# Patient Record
Sex: Female | Born: 1935
Health system: Southern US, Community
[De-identification: ages and names within clinical notes are randomized; demographics above are authoritative.]

## PROBLEM LIST (undated history)

## (undated) DIAGNOSIS — J180 Bronchopneumonia, unspecified organism: Secondary | ICD-10-CM

## (undated) DIAGNOSIS — M8580 Other specified disorders of bone density and structure, unspecified site: Secondary | ICD-10-CM

## (undated) DIAGNOSIS — L298 Other pruritus: Secondary | ICD-10-CM

## (undated) DIAGNOSIS — I639 Cerebral infarction, unspecified: Secondary | ICD-10-CM

## (undated) DIAGNOSIS — G4730 Sleep apnea, unspecified: Secondary | ICD-10-CM

## (undated) DIAGNOSIS — I10 Essential (primary) hypertension: Secondary | ICD-10-CM

## (undated) DIAGNOSIS — M1990 Unspecified osteoarthritis, unspecified site: Secondary | ICD-10-CM

## (undated) DIAGNOSIS — T783XXA Angioneurotic edema, initial encounter: Secondary | ICD-10-CM

## (undated) DIAGNOSIS — E538 Deficiency of other specified B group vitamins: Secondary | ICD-10-CM

## (undated) DIAGNOSIS — N289 Disorder of kidney and ureter, unspecified: Secondary | ICD-10-CM

## (undated) DIAGNOSIS — IMO0002 Reserved for concepts with insufficient information to code with codable children: Secondary | ICD-10-CM

## (undated) DIAGNOSIS — E785 Hyperlipidemia, unspecified: Secondary | ICD-10-CM

## (undated) DIAGNOSIS — I517 Cardiomegaly: Secondary | ICD-10-CM

## (undated) DIAGNOSIS — L2989 Other pruritus: Secondary | ICD-10-CM

## (undated) HISTORY — DX: Essential (primary) hypertension: I10

## (undated) HISTORY — DX: Other pruritus: L29.8

## (undated) HISTORY — PX: EYE SURGERY: SHX253

## (undated) HISTORY — PX: ABDOMINAL HYSTERECTOMY: SHX81

## (undated) HISTORY — DX: Disorder of kidney and ureter, unspecified: N28.9

## (undated) HISTORY — DX: Cerebral infarction, unspecified: I63.9

## (undated) HISTORY — DX: Hyperlipidemia, unspecified: E78.5

## (undated) HISTORY — DX: Other specified disorders of bone density and structure, unspecified site: M85.80

## (undated) HISTORY — DX: Cardiomegaly: I51.7

## (undated) HISTORY — DX: Deficiency of other specified B group vitamins: E53.8

## (undated) HISTORY — PX: COLON SURGERY: SHX602

## (undated) HISTORY — PX: APPENDECTOMY: SHX54

## (undated) HISTORY — DX: Unspecified osteoarthritis, unspecified site: M19.90

## (undated) HISTORY — DX: Other pruritus: L29.89

## (undated) HISTORY — PX: BACK SURGERY: SHX140

## (undated) HISTORY — DX: Reserved for concepts with insufficient information to code with codable children: IMO0002

## (undated) HISTORY — DX: Sleep apnea, unspecified: G47.30

## (undated) HISTORY — DX: Angioneurotic edema, initial encounter: T78.3XXA

## (undated) HISTORY — PX: KNEE SURGERY: SHX244

---

## 1998-06-28 ENCOUNTER — Encounter: Payer: Self-pay | Admitting: Gastroenterology

## 1998-06-28 ENCOUNTER — Inpatient Hospital Stay (HOSPITAL_COMMUNITY): Admission: RE | Admit: 1998-06-28 | Discharge: 1998-07-04 | Payer: Self-pay | Admitting: Gastroenterology

## 1998-11-21 ENCOUNTER — Encounter: Admission: RE | Admit: 1998-11-21 | Discharge: 1998-12-15 | Payer: Self-pay | Admitting: Orthopaedic Surgery

## 2002-03-16 ENCOUNTER — Other Ambulatory Visit: Admission: RE | Admit: 2002-03-16 | Discharge: 2002-03-16 | Payer: Self-pay | Admitting: Family Medicine

## 2003-09-06 ENCOUNTER — Ambulatory Visit (HOSPITAL_COMMUNITY): Admission: RE | Admit: 2003-09-06 | Discharge: 2003-09-06 | Payer: Self-pay | Admitting: Gastroenterology

## 2004-09-12 ENCOUNTER — Ambulatory Visit: Payer: Self-pay | Admitting: Family Medicine

## 2004-09-19 ENCOUNTER — Ambulatory Visit: Payer: Self-pay | Admitting: Family Medicine

## 2004-10-03 ENCOUNTER — Ambulatory Visit: Payer: Self-pay | Admitting: Family Medicine

## 2004-10-11 ENCOUNTER — Ambulatory Visit: Payer: Self-pay | Admitting: Family Medicine

## 2004-10-18 ENCOUNTER — Ambulatory Visit: Payer: Self-pay | Admitting: Family Medicine

## 2004-11-15 ENCOUNTER — Ambulatory Visit: Payer: Self-pay | Admitting: Family Medicine

## 2004-11-18 ENCOUNTER — Ambulatory Visit: Payer: Self-pay | Admitting: Family Medicine

## 2004-12-11 ENCOUNTER — Ambulatory Visit: Payer: Self-pay | Admitting: Family Medicine

## 2004-12-18 ENCOUNTER — Ambulatory Visit: Payer: Self-pay | Admitting: Family Medicine

## 2005-03-21 ENCOUNTER — Ambulatory Visit: Payer: Self-pay | Admitting: Family Medicine

## 2005-06-06 ENCOUNTER — Ambulatory Visit: Payer: Self-pay | Admitting: Family Medicine

## 2005-06-26 ENCOUNTER — Ambulatory Visit: Payer: Self-pay | Admitting: Family Medicine

## 2005-07-23 ENCOUNTER — Ambulatory Visit: Payer: Self-pay | Admitting: Family Medicine

## 2005-07-26 ENCOUNTER — Ambulatory Visit: Payer: Self-pay

## 2005-09-26 ENCOUNTER — Encounter: Payer: Self-pay | Admitting: Family Medicine

## 2005-09-26 ENCOUNTER — Ambulatory Visit: Payer: Self-pay | Admitting: Family Medicine

## 2005-09-26 ENCOUNTER — Other Ambulatory Visit: Admission: RE | Admit: 2005-09-26 | Discharge: 2005-09-26 | Payer: Self-pay | Admitting: Family Medicine

## 2005-10-02 ENCOUNTER — Encounter: Payer: Self-pay | Admitting: Family Medicine

## 2005-10-02 LAB — CONVERTED CEMR LAB: Pap Smear: NORMAL

## 2005-10-17 LAB — FECAL OCCULT BLOOD, GUAIAC: Fecal Occult Blood: NEGATIVE

## 2005-10-18 ENCOUNTER — Ambulatory Visit: Payer: Self-pay | Admitting: Family Medicine

## 2005-12-28 ENCOUNTER — Ambulatory Visit: Payer: Self-pay | Admitting: Family Medicine

## 2006-04-02 ENCOUNTER — Ambulatory Visit: Payer: Self-pay | Admitting: Family Medicine

## 2006-07-24 ENCOUNTER — Ambulatory Visit: Payer: Self-pay | Admitting: Family Medicine

## 2006-08-23 ENCOUNTER — Ambulatory Visit: Payer: Self-pay | Admitting: Family Medicine

## 2006-10-01 ENCOUNTER — Ambulatory Visit: Payer: Self-pay | Admitting: Family Medicine

## 2006-10-01 LAB — CONVERTED CEMR LAB
ALT: 18 units/L (ref 0–40)
AST: 17 units/L (ref 0–37)
CO2: 31 meq/L (ref 19–32)
Chloride: 100 meq/L (ref 96–112)
Cholesterol: 174 mg/dL (ref 0–200)
Creatinine, Ser: 1.5 mg/dL — ABNORMAL HIGH (ref 0.4–1.2)
HDL: 34 mg/dL — ABNORMAL LOW (ref >39.0)
Hgb A1c MFr Bld: 8.3 %
Hgb A1c MFr Bld: 8.3 % — ABNORMAL HIGH (ref 4.6–6.0)
LDL Cholesterol: 109 mg/dL — ABNORMAL HIGH (ref 0–99)
Potassium: 4.2 meq/L (ref 3.5–5.1)
Sodium: 139 meq/L (ref 135–145)
Total CHOL/HDL Ratio: 5.1
Triglycerides: 157 mg/dL — ABNORMAL HIGH (ref 0–149)
VLDL: 31 mg/dL (ref 0–40)

## 2006-10-04 ENCOUNTER — Ambulatory Visit: Payer: Self-pay | Admitting: Family Medicine

## 2006-10-18 ENCOUNTER — Ambulatory Visit: Payer: Self-pay | Admitting: Family Medicine

## 2006-10-18 LAB — CONVERTED CEMR LAB
Albumin: 3.4 g/dL — ABNORMAL LOW (ref 3.5–5.2)
BUN: 22 mg/dL (ref 6–23)
CO2: 26 meq/L (ref 19–32)
Calcium: 8.8 mg/dL (ref 8.4–10.5)
Chloride: 104 meq/L (ref 96–112)
Creatinine, Ser: 1.2 mg/dL (ref 0.4–1.2)
Creatinine,U: 211.6 mg/dL
GFR calc Af Amer: 56 mL/min
GFR calc non Af Amer: 47 mL/min
Glucose, Bld: 157 mg/dL — ABNORMAL HIGH (ref 70–99)
Microalb Creat Ratio: 0.9 mg/g (ref 0.0–30.0)
Microalb, Ur: 0.2 mg/dL (ref 0.0–1.9)
Phosphorus: 3 mg/dL (ref 2.3–4.6)
Potassium: 3.9 meq/L (ref 3.5–5.1)
Sodium: 136 meq/L (ref 135–145)

## 2006-11-04 ENCOUNTER — Ambulatory Visit: Payer: Self-pay | Admitting: Family Medicine

## 2006-11-19 DIAGNOSIS — E538 Deficiency of other specified B group vitamins: Secondary | ICD-10-CM

## 2006-11-19 HISTORY — DX: Deficiency of other specified B group vitamins: E53.8

## 2006-11-27 ENCOUNTER — Encounter: Payer: Self-pay | Admitting: Family Medicine

## 2006-11-27 DIAGNOSIS — N6019 Diffuse cystic mastopathy of unspecified breast: Secondary | ICD-10-CM | POA: Insufficient documentation

## 2006-11-27 DIAGNOSIS — R32 Unspecified urinary incontinence: Secondary | ICD-10-CM | POA: Insufficient documentation

## 2006-11-27 DIAGNOSIS — L719 Rosacea, unspecified: Secondary | ICD-10-CM | POA: Insufficient documentation

## 2006-11-27 DIAGNOSIS — N3946 Mixed incontinence: Secondary | ICD-10-CM | POA: Insufficient documentation

## 2006-11-27 DIAGNOSIS — E785 Hyperlipidemia, unspecified: Secondary | ICD-10-CM

## 2006-11-27 DIAGNOSIS — I10 Essential (primary) hypertension: Secondary | ICD-10-CM | POA: Insufficient documentation

## 2006-11-27 DIAGNOSIS — E11319 Type 2 diabetes mellitus with unspecified diabetic retinopathy without macular edema: Secondary | ICD-10-CM | POA: Insufficient documentation

## 2006-11-27 DIAGNOSIS — M1990 Unspecified osteoarthritis, unspecified site: Secondary | ICD-10-CM | POA: Insufficient documentation

## 2006-11-27 DIAGNOSIS — E1122 Type 2 diabetes mellitus with diabetic chronic kidney disease: Secondary | ICD-10-CM | POA: Insufficient documentation

## 2006-11-27 DIAGNOSIS — T783XXA Angioneurotic edema, initial encounter: Secondary | ICD-10-CM | POA: Insufficient documentation

## 2006-11-29 ENCOUNTER — Ambulatory Visit: Payer: Self-pay | Admitting: Family Medicine

## 2006-11-29 LAB — CONVERTED CEMR LAB
ALT: 17 units/L (ref 0–40)
AST: 19 units/L (ref 0–37)
Albumin: 3.5 g/dL (ref 3.5–5.2)
BUN: 19 mg/dL (ref 6–23)
Basophils Absolute: 0 10*3/uL (ref 0.0–0.1)
Basophils Relative: 0.1 % (ref 0.0–1.0)
CO2: 31 meq/L (ref 19–32)
Calcium: 8.9 mg/dL (ref 8.4–10.5)
Chloride: 108 meq/L (ref 96–112)
Cholesterol: 164 mg/dL (ref 0–200)
Creatinine, Ser: 1.2 mg/dL (ref 0.4–1.2)
Eosinophils Absolute: 0.3 10*3/uL (ref 0.0–0.6)
Eosinophils Relative: 5.6 % — ABNORMAL HIGH (ref 0.0–5.0)
Folate: 8.3 ng/mL
GFR calc Af Amer: 56 mL/min
GFR calc non Af Amer: 47 mL/min
Glucose, Bld: 147 mg/dL — ABNORMAL HIGH (ref 70–99)
HCT: 37.7 % (ref 36.0–46.0)
HDL: 32.9 mg/dL — ABNORMAL LOW (ref >39.0)
Hemoglobin: 13 g/dL (ref 12.0–15.0)
Hgb A1c MFr Bld: 7.1 % — ABNORMAL HIGH (ref 4.6–6.0)
LDL Cholesterol: 100 mg/dL — ABNORMAL HIGH (ref 0–99)
Lymphocytes Relative: 19.7 % (ref 12.0–46.0)
MCHC: 34.4 g/dL (ref 30.0–36.0)
MCV: 80.8 fL (ref 78.0–100.0)
Monocytes Absolute: 0.4 10*3/uL (ref 0.2–0.7)
Monocytes Relative: 8.6 % (ref 3.0–11.0)
Neutro Abs: 2.9 10*3/uL (ref 1.4–7.7)
Neutrophils Relative %: 66 % (ref 43.0–77.0)
Phosphorus: 3.2 mg/dL (ref 2.3–4.6)
Platelets: 205 10*3/uL (ref 150–400)
Potassium: 4.5 meq/L (ref 3.5–5.1)
RBC: 4.66 M/uL (ref 3.87–5.11)
RDW: 13.2 % (ref 11.5–14.6)
Sodium: 144 meq/L (ref 135–145)
TSH: 2.68 microintl units/mL (ref 0.35–5.50)
Total CHOL/HDL Ratio: 5
Triglycerides: 158 mg/dL — ABNORMAL HIGH (ref 0–149)
VLDL: 32 mg/dL (ref 0–40)
Vitamin B-12: 116 pg/mL — ABNORMAL LOW (ref 211–911)
WBC: 4.5 10*3/uL (ref 4.5–10.5)

## 2006-12-04 ENCOUNTER — Ambulatory Visit: Payer: Self-pay | Admitting: Family Medicine

## 2007-01-10 ENCOUNTER — Ambulatory Visit: Payer: Self-pay | Admitting: Family Medicine

## 2007-01-10 DIAGNOSIS — E538 Deficiency of other specified B group vitamins: Secondary | ICD-10-CM | POA: Insufficient documentation

## 2007-03-05 ENCOUNTER — Ambulatory Visit: Payer: Self-pay | Admitting: Family Medicine

## 2007-03-07 LAB — CONVERTED CEMR LAB
ALT: 16 units/L (ref 0–35)
AST: 16 units/L (ref 0–37)
Cholesterol: 166 mg/dL (ref 0–200)
Creatinine, Ser: 1.2 mg/dL (ref 0.4–1.2)
HDL: 25.6 mg/dL — ABNORMAL LOW (ref >39.0)
Hgb A1c MFr Bld: 6.9 % — ABNORMAL HIGH (ref 4.6–6.0)
LDL Cholesterol: 107 mg/dL — ABNORMAL HIGH (ref 0–99)
Total CHOL/HDL Ratio: 6.5
Triglycerides: 167 mg/dL — ABNORMAL HIGH (ref 0–149)
VLDL: 33 mg/dL (ref 0–40)

## 2007-04-07 ENCOUNTER — Ambulatory Visit: Payer: Self-pay | Admitting: Family Medicine

## 2007-05-14 ENCOUNTER — Ambulatory Visit: Payer: Self-pay | Admitting: Family Medicine

## 2007-06-16 ENCOUNTER — Ambulatory Visit: Payer: Self-pay | Admitting: Family Medicine

## 2007-06-30 ENCOUNTER — Encounter: Payer: Self-pay | Admitting: Family Medicine

## 2007-07-14 ENCOUNTER — Ambulatory Visit: Payer: Self-pay | Admitting: Family Medicine

## 2007-07-14 LAB — CONVERTED CEMR LAB
Bilirubin Urine: NEGATIVE
Blood in Urine, dipstick: NEGATIVE
Casts: 0 /lpf
Glucose, Urine, Semiquant: NEGATIVE
Ketones, urine, test strip: NEGATIVE
Nitrite: NEGATIVE
Protein, U semiquant: NEGATIVE
RBC / HPF: 0
Specific Gravity, Urine: 1.01
Urine crystals, microscopic: 0 /hpf
Urobilinogen, UA: 0.2
Yeast, UA: 0
pH: 6.5

## 2007-07-15 ENCOUNTER — Encounter: Payer: Self-pay | Admitting: Family Medicine

## 2007-07-18 ENCOUNTER — Encounter: Payer: Self-pay | Admitting: Family Medicine

## 2007-09-08 ENCOUNTER — Ambulatory Visit: Payer: Self-pay | Admitting: Family Medicine

## 2007-09-12 LAB — CONVERTED CEMR LAB
ALT: 16 units/L (ref 0–35)
AST: 16 units/L (ref 0–37)
Albumin: 3.4 g/dL — ABNORMAL LOW (ref 3.5–5.2)
BUN: 17 mg/dL (ref 6–23)
CO2: 27 meq/L (ref 19–32)
Calcium: 9 mg/dL (ref 8.4–10.5)
Chloride: 103 meq/L (ref 96–112)
Cholesterol: 144 mg/dL (ref 0–200)
Creatinine, Ser: 1.3 mg/dL — ABNORMAL HIGH (ref 0.4–1.2)
GFR calc Af Amer: 51 mL/min
GFR calc non Af Amer: 42 mL/min
Glucose, Bld: 152 mg/dL — ABNORMAL HIGH (ref 70–99)
HDL: 30.6 mg/dL — ABNORMAL LOW (ref >39.0)
Hgb A1c MFr Bld: 6.1 % — ABNORMAL HIGH (ref 4.6–6.0)
LDL Cholesterol: 99 mg/dL (ref 0–99)
Phosphorus: 3.1 mg/dL (ref 2.3–4.6)
Potassium: 3.8 meq/L (ref 3.5–5.1)
Sodium: 137 meq/L (ref 135–145)
Total CHOL/HDL Ratio: 4.7
Triglycerides: 72 mg/dL (ref 0–149)
VLDL: 14 mg/dL (ref 0–40)

## 2007-10-03 ENCOUNTER — Ambulatory Visit: Payer: Self-pay | Admitting: Family Medicine

## 2007-10-03 LAB — CONVERTED CEMR LAB: Whiff Test: NEGATIVE

## 2007-10-24 ENCOUNTER — Encounter (INDEPENDENT_AMBULATORY_CARE_PROVIDER_SITE_OTHER): Payer: Self-pay | Admitting: *Deleted

## 2007-11-20 ENCOUNTER — Encounter: Payer: Self-pay | Admitting: Family Medicine

## 2007-11-24 ENCOUNTER — Encounter: Payer: Self-pay | Admitting: Family Medicine

## 2007-12-03 ENCOUNTER — Encounter: Payer: Self-pay | Admitting: Family Medicine

## 2007-12-05 ENCOUNTER — Encounter (INDEPENDENT_AMBULATORY_CARE_PROVIDER_SITE_OTHER): Payer: Self-pay | Admitting: *Deleted

## 2008-01-26 ENCOUNTER — Ambulatory Visit: Payer: Self-pay | Admitting: Family Medicine

## 2008-01-26 DIAGNOSIS — R609 Edema, unspecified: Secondary | ICD-10-CM | POA: Insufficient documentation

## 2008-01-26 DIAGNOSIS — G8929 Other chronic pain: Secondary | ICD-10-CM | POA: Insufficient documentation

## 2008-01-26 DIAGNOSIS — M549 Dorsalgia, unspecified: Secondary | ICD-10-CM

## 2008-01-27 LAB — CONVERTED CEMR LAB
ALT: 14 units/L (ref 0–35)
AST: 14 units/L (ref 0–37)
Albumin: 3.5 g/dL (ref 3.5–5.2)
BUN: 28 mg/dL — ABNORMAL HIGH (ref 6–23)
CO2: 30 meq/L (ref 19–32)
Calcium: 8.8 mg/dL (ref 8.4–10.5)
Chloride: 102 meq/L (ref 96–112)
Cholesterol: 166 mg/dL (ref 0–200)
Creatinine, Ser: 1.3 mg/dL — ABNORMAL HIGH (ref 0.4–1.2)
GFR calc Af Amer: 51 mL/min
GFR calc non Af Amer: 42 mL/min
Glucose, Bld: 144 mg/dL — ABNORMAL HIGH (ref 70–99)
HDL: 28.2 mg/dL — ABNORMAL LOW (ref >39.0)
Hgb A1c MFr Bld: 6.5 % — ABNORMAL HIGH (ref 4.6–6.0)
LDL Cholesterol: 117 mg/dL — ABNORMAL HIGH (ref 0–99)
Phosphorus: 3.3 mg/dL (ref 2.3–4.6)
Potassium: 4.4 meq/L (ref 3.5–5.1)
Sodium: 140 meq/L (ref 135–145)
Total CHOL/HDL Ratio: 5.9
Triglycerides: 103 mg/dL (ref 0–149)
VLDL: 21 mg/dL (ref 0–40)

## 2008-02-23 ENCOUNTER — Encounter: Payer: Self-pay | Admitting: Family Medicine

## 2008-03-02 ENCOUNTER — Ambulatory Visit: Payer: Self-pay | Admitting: Family Medicine

## 2008-03-20 HISTORY — PX: SPINE SURGERY: SHX786

## 2008-04-02 ENCOUNTER — Inpatient Hospital Stay (HOSPITAL_COMMUNITY): Admission: AD | Admit: 2008-04-02 | Discharge: 2008-04-03 | Payer: Self-pay | Admitting: Specialist

## 2008-04-06 ENCOUNTER — Encounter: Payer: Self-pay | Admitting: Family Medicine

## 2008-04-28 ENCOUNTER — Ambulatory Visit: Payer: Self-pay | Admitting: Family Medicine

## 2008-04-29 ENCOUNTER — Encounter: Payer: Self-pay | Admitting: Family Medicine

## 2008-05-11 ENCOUNTER — Telehealth (INDEPENDENT_AMBULATORY_CARE_PROVIDER_SITE_OTHER): Payer: Self-pay | Admitting: *Deleted

## 2008-05-11 ENCOUNTER — Ambulatory Visit: Payer: Self-pay | Admitting: Family Medicine

## 2008-05-12 ENCOUNTER — Encounter: Payer: Self-pay | Admitting: Family Medicine

## 2008-05-28 ENCOUNTER — Ambulatory Visit: Payer: Self-pay | Admitting: Family Medicine

## 2008-06-23 ENCOUNTER — Telehealth: Payer: Self-pay | Admitting: Family Medicine

## 2008-06-29 ENCOUNTER — Ambulatory Visit: Payer: Self-pay | Admitting: Family Medicine

## 2008-06-29 DIAGNOSIS — N289 Disorder of kidney and ureter, unspecified: Secondary | ICD-10-CM | POA: Insufficient documentation

## 2008-06-30 LAB — CONVERTED CEMR LAB
ALT: 36 units/L — ABNORMAL HIGH (ref 0–35)
AST: 30 units/L (ref 0–37)
Albumin: 3.5 g/dL (ref 3.5–5.2)
Alkaline Phosphatase: 73 units/L (ref 39–117)
BUN: 18 mg/dL (ref 6–23)
Bilirubin, Direct: 0.1 mg/dL (ref 0.0–0.3)
CO2: 30 meq/L (ref 19–32)
Calcium: 9.3 mg/dL (ref 8.4–10.5)
Chloride: 106 meq/L (ref 96–112)
Creatinine, Ser: 1.2 mg/dL (ref 0.4–1.2)
GFR calc Af Amer: 56 mL/min
GFR calc non Af Amer: 46 mL/min
Glucose, Bld: 142 mg/dL — ABNORMAL HIGH (ref 70–99)
Hgb A1c MFr Bld: 7.1 % — ABNORMAL HIGH (ref 4.6–6.0)
Phosphorus: 3.5 mg/dL (ref 2.3–4.6)
Potassium: 4.8 meq/L (ref 3.5–5.1)
Sodium: 140 meq/L (ref 135–145)
Total Bilirubin: 0.8 mg/dL (ref 0.3–1.2)
Total Protein: 6.1 g/dL (ref 6.0–8.3)

## 2008-08-03 ENCOUNTER — Telehealth (INDEPENDENT_AMBULATORY_CARE_PROVIDER_SITE_OTHER): Payer: Self-pay | Admitting: *Deleted

## 2008-08-03 ENCOUNTER — Ambulatory Visit: Payer: Self-pay | Admitting: Family Medicine

## 2008-08-03 DIAGNOSIS — M6281 Muscle weakness (generalized): Secondary | ICD-10-CM | POA: Insufficient documentation

## 2008-08-06 ENCOUNTER — Encounter: Payer: Self-pay | Admitting: Family Medicine

## 2008-08-06 LAB — HM COLONOSCOPY

## 2008-08-17 ENCOUNTER — Ambulatory Visit: Payer: Self-pay | Admitting: Family Medicine

## 2008-08-18 LAB — CONVERTED CEMR LAB
ALT: 20 units/L (ref 0–35)
AST: 19 units/L (ref 0–37)
Albumin: 3.2 g/dL — ABNORMAL LOW (ref 3.5–5.2)
Alkaline Phosphatase: 58 units/L (ref 39–117)
BUN: 26 mg/dL — ABNORMAL HIGH (ref 6–23)
Bilirubin, Direct: 0.1 mg/dL (ref 0.0–0.3)
CO2: 29 meq/L (ref 19–32)
Calcium: 8.8 mg/dL (ref 8.4–10.5)
Chloride: 105 meq/L (ref 96–112)
Creatinine, Ser: 1.2 mg/dL (ref 0.4–1.2)
GFR calc Af Amer: 56 mL/min
GFR calc non Af Amer: 46 mL/min
Glucose, Bld: 161 mg/dL — ABNORMAL HIGH (ref 70–99)
Phosphorus: 3.3 mg/dL (ref 2.3–4.6)
Potassium: 4.3 meq/L (ref 3.5–5.1)
Sodium: 139 meq/L (ref 135–145)
Total Bilirubin: 0.7 mg/dL (ref 0.3–1.2)
Total Protein: 5.6 g/dL — ABNORMAL LOW (ref 6.0–8.3)

## 2008-08-31 ENCOUNTER — Telehealth: Payer: Self-pay | Admitting: Family Medicine

## 2008-09-08 ENCOUNTER — Ambulatory Visit: Payer: Self-pay | Admitting: Family Medicine

## 2008-10-29 ENCOUNTER — Encounter: Payer: Self-pay | Admitting: Family Medicine

## 2008-11-05 ENCOUNTER — Ambulatory Visit: Payer: Self-pay | Admitting: Family Medicine

## 2008-11-08 LAB — CONVERTED CEMR LAB
Creatinine, Ser: 1.1 mg/dL (ref 0.4–1.2)
Creatinine,U: 139.4 mg/dL
Glucose, Bld: 140 mg/dL — ABNORMAL HIGH (ref 70–99)
Hgb A1c MFr Bld: 7 % — ABNORMAL HIGH (ref 4.6–6.5)
Microalb Creat Ratio: 1.4 mg/g (ref 0.0–30.0)
Microalb, Ur: 0.2 mg/dL (ref 0.0–1.9)
Phosphorus: 3.5 mg/dL (ref 2.3–4.6)
Potassium: 4.3 meq/L (ref 3.5–5.1)
Sodium: 142 meq/L (ref 135–145)

## 2008-11-09 ENCOUNTER — Encounter (INDEPENDENT_AMBULATORY_CARE_PROVIDER_SITE_OTHER): Payer: Self-pay | Admitting: *Deleted

## 2008-11-12 ENCOUNTER — Ambulatory Visit: Payer: Self-pay | Admitting: Family Medicine

## 2008-11-18 LAB — HM DEXA SCAN

## 2008-11-23 ENCOUNTER — Encounter: Payer: Self-pay | Admitting: Family Medicine

## 2008-11-24 ENCOUNTER — Encounter: Payer: Self-pay | Admitting: Family Medicine

## 2008-11-25 ENCOUNTER — Ambulatory Visit: Payer: Self-pay | Admitting: Internal Medicine

## 2008-11-25 ENCOUNTER — Encounter: Payer: Self-pay | Admitting: Family Medicine

## 2008-11-26 ENCOUNTER — Encounter: Payer: Self-pay | Admitting: Family Medicine

## 2008-11-29 ENCOUNTER — Encounter (INDEPENDENT_AMBULATORY_CARE_PROVIDER_SITE_OTHER): Payer: Self-pay | Admitting: *Deleted

## 2008-12-13 ENCOUNTER — Encounter: Payer: Self-pay | Admitting: Family Medicine

## 2008-12-20 DIAGNOSIS — M8580 Other specified disorders of bone density and structure, unspecified site: Secondary | ICD-10-CM | POA: Insufficient documentation

## 2009-01-04 ENCOUNTER — Ambulatory Visit: Payer: Self-pay | Admitting: Family Medicine

## 2009-01-04 LAB — CONVERTED CEMR LAB
Bacteria, UA: 0
Bilirubin Urine: NEGATIVE
Blood in Urine, dipstick: NEGATIVE
Casts: 0 /lpf
Glucose, Urine, Semiquant: NEGATIVE
Ketones, urine, test strip: NEGATIVE
Nitrite: NEGATIVE
Specific Gravity, Urine: 1.02
Urobilinogen, UA: 0.2
WBC Urine, dipstick: NEGATIVE
pH: 6

## 2009-01-05 ENCOUNTER — Encounter (INDEPENDENT_AMBULATORY_CARE_PROVIDER_SITE_OTHER): Payer: Self-pay | Admitting: Internal Medicine

## 2009-01-07 ENCOUNTER — Ambulatory Visit: Payer: Self-pay | Admitting: Family Medicine

## 2009-02-14 ENCOUNTER — Ambulatory Visit: Payer: Self-pay | Admitting: Family Medicine

## 2009-02-15 LAB — CONVERTED CEMR LAB
ALT: 19 units/L (ref 0–35)
AST: 19 units/L (ref 0–37)
Albumin: 3.3 g/dL — ABNORMAL LOW (ref 3.5–5.2)
BUN: 23 mg/dL (ref 6–23)
CO2: 31 meq/L (ref 19–32)
Calcium: 9 mg/dL (ref 8.4–10.5)
Chloride: 108 meq/L (ref 96–112)
Cholesterol: 175 mg/dL (ref 0–200)
Creatinine, Ser: 1.1 mg/dL (ref 0.4–1.2)
Glucose, Bld: 130 mg/dL — ABNORMAL HIGH (ref 70–99)
HDL: 29 mg/dL — ABNORMAL LOW (ref >39.00)
Hgb A1c MFr Bld: 6.4 % (ref 4.6–6.5)
LDL Cholesterol: 115 mg/dL — ABNORMAL HIGH (ref 0–99)
Phosphorus: 3.6 mg/dL (ref 2.3–4.6)
Potassium: 4.7 meq/L (ref 3.5–5.1)
Sodium: 143 meq/L (ref 135–145)
Total CHOL/HDL Ratio: 6
Triglycerides: 156 mg/dL — ABNORMAL HIGH (ref 0.0–149.0)
VLDL: 31.2 mg/dL (ref 0.0–40.0)

## 2009-03-02 ENCOUNTER — Ambulatory Visit: Payer: Self-pay | Admitting: Family Medicine

## 2009-03-07 ENCOUNTER — Telehealth: Payer: Self-pay | Admitting: Family Medicine

## 2009-03-11 ENCOUNTER — Ambulatory Visit: Payer: Self-pay | Admitting: Family Medicine

## 2009-03-25 ENCOUNTER — Ambulatory Visit: Payer: Self-pay | Admitting: Family Medicine

## 2009-03-28 LAB — CONVERTED CEMR LAB
Albumin: 3.6 g/dL (ref 3.5–5.2)
BUN: 29 mg/dL — ABNORMAL HIGH (ref 6–23)
CO2: 31 meq/L (ref 19–32)
Calcium: 8.9 mg/dL (ref 8.4–10.5)
Chloride: 106 meq/L (ref 96–112)
Creatinine, Ser: 1.3 mg/dL — ABNORMAL HIGH (ref 0.4–1.2)
Glucose, Bld: 189 mg/dL — ABNORMAL HIGH (ref 70–99)
Phosphorus: 3.9 mg/dL (ref 2.3–4.6)
Potassium: 4.4 meq/L (ref 3.5–5.1)
Sodium: 142 meq/L (ref 135–145)

## 2009-03-29 ENCOUNTER — Telehealth (INDEPENDENT_AMBULATORY_CARE_PROVIDER_SITE_OTHER): Payer: Self-pay | Admitting: *Deleted

## 2009-04-06 ENCOUNTER — Ambulatory Visit: Payer: Self-pay | Admitting: Family Medicine

## 2009-04-06 LAB — CONVERTED CEMR LAB
Bilirubin Urine: NEGATIVE
Blood in Urine, dipstick: NEGATIVE
Casts: 0 /lpf
Glucose, Urine, Semiquant: NEGATIVE
Ketones, urine, test strip: NEGATIVE
Nitrite: NEGATIVE
Protein, U semiquant: NEGATIVE
RBC / HPF: 1
Specific Gravity, Urine: 1.025
Urine crystals, microscopic: 0 /hpf
Urobilinogen, UA: 0.2
WBC Urine, dipstick: NEGATIVE
Yeast, UA: 0
pH: 6

## 2009-04-07 ENCOUNTER — Encounter: Payer: Self-pay | Admitting: Family Medicine

## 2009-04-11 ENCOUNTER — Ambulatory Visit: Payer: Self-pay | Admitting: Family Medicine

## 2009-04-12 ENCOUNTER — Encounter: Payer: Self-pay | Admitting: Family Medicine

## 2009-06-17 ENCOUNTER — Ambulatory Visit: Payer: Self-pay | Admitting: Family Medicine

## 2009-06-20 LAB — CONVERTED CEMR LAB
ALT: 21 units/L (ref 0–35)
AST: 18 units/L (ref 0–37)
Albumin: 3.6 g/dL (ref 3.5–5.2)
BUN: 22 mg/dL (ref 6–23)
CO2: 31 meq/L (ref 19–32)
Calcium: 8.8 mg/dL (ref 8.4–10.5)
Chloride: 103 meq/L (ref 96–112)
Cholesterol: 191 mg/dL (ref 0–200)
Creatinine, Ser: 1.5 mg/dL — ABNORMAL HIGH (ref 0.4–1.2)
GFR calc non Af Amer: 35.67 mL/min (ref >60)
Glucose, Bld: 177 mg/dL — ABNORMAL HIGH (ref 70–99)
HDL: 33.5 mg/dL — ABNORMAL LOW (ref >39.00)
Hgb A1c MFr Bld: 7 % — ABNORMAL HIGH (ref 4.6–6.5)
LDL Cholesterol: 123 mg/dL — ABNORMAL HIGH (ref 0–99)
Phosphorus: 3.2 mg/dL (ref 2.3–4.6)
Potassium: 4.5 meq/L (ref 3.5–5.1)
Sodium: 143 meq/L (ref 135–145)
Total CHOL/HDL Ratio: 6
Triglycerides: 172 mg/dL — ABNORMAL HIGH (ref 0.0–149.0)
VLDL: 34.4 mg/dL (ref 0.0–40.0)

## 2009-06-22 ENCOUNTER — Ambulatory Visit: Payer: Self-pay | Admitting: Family Medicine

## 2009-06-22 LAB — CONVERTED CEMR LAB
Bacteria, UA: 0
Bilirubin Urine: NEGATIVE
Blood in Urine, dipstick: NEGATIVE
Casts: 0 /lpf
Glucose, Urine, Semiquant: NEGATIVE
Ketones, urine, test strip: NEGATIVE
Nitrite: NEGATIVE
RBC / HPF: 0
Specific Gravity, Urine: 1.02
Urine crystals, microscopic: 0 /hpf
Urobilinogen, UA: 0.2
WBC Urine, dipstick: NEGATIVE
WBC, UA: 0 cells/hpf
Yeast, UA: 0
pH: 6

## 2009-07-04 ENCOUNTER — Ambulatory Visit: Payer: Self-pay | Admitting: Family Medicine

## 2009-07-08 ENCOUNTER — Ambulatory Visit: Payer: Self-pay | Admitting: Family Medicine

## 2009-07-18 ENCOUNTER — Telehealth: Payer: Self-pay | Admitting: Family Medicine

## 2009-07-28 ENCOUNTER — Encounter: Payer: Self-pay | Admitting: Family Medicine

## 2009-08-08 ENCOUNTER — Ambulatory Visit: Payer: Self-pay | Admitting: Family Medicine

## 2009-08-25 ENCOUNTER — Encounter: Payer: Self-pay | Admitting: Family Medicine

## 2009-09-21 ENCOUNTER — Encounter: Payer: Self-pay | Admitting: Family Medicine

## 2009-10-05 ENCOUNTER — Encounter: Payer: Self-pay | Admitting: Family Medicine

## 2009-10-27 ENCOUNTER — Encounter: Payer: Self-pay | Admitting: Family Medicine

## 2009-10-31 ENCOUNTER — Encounter: Payer: Self-pay | Admitting: Family Medicine

## 2009-11-07 ENCOUNTER — Ambulatory Visit: Payer: Self-pay | Admitting: Family Medicine

## 2009-11-07 LAB — CONVERTED CEMR LAB
ALT: 19 units/L (ref 0–35)
AST: 16 units/L (ref 0–37)
Albumin: 3.4 g/dL — ABNORMAL LOW (ref 3.5–5.2)
BUN: 24 mg/dL — ABNORMAL HIGH (ref 6–23)
CO2: 30 meq/L (ref 19–32)
Calcium: 9 mg/dL (ref 8.4–10.5)
Chloride: 108 meq/L (ref 96–112)
Cholesterol: 178 mg/dL (ref 0–200)
Creatinine, Ser: 1.3 mg/dL — ABNORMAL HIGH (ref 0.4–1.2)
GFR calc non Af Amer: 42.03 mL/min (ref >60)
Glucose, Bld: 196 mg/dL — ABNORMAL HIGH (ref 70–99)
HDL: 36.6 mg/dL — ABNORMAL LOW (ref >39.00)
Hgb A1c MFr Bld: 7.9 % — ABNORMAL HIGH (ref 4.6–6.5)
LDL Cholesterol: 114 mg/dL — ABNORMAL HIGH (ref 0–99)
Phosphorus: 3.6 mg/dL (ref 2.3–4.6)
Potassium: 4.2 meq/L (ref 3.5–5.1)
Sodium: 142 meq/L (ref 135–145)
Total CHOL/HDL Ratio: 5
Triglycerides: 137 mg/dL (ref 0.0–149.0)
VLDL: 27.4 mg/dL (ref 0.0–40.0)

## 2009-11-09 ENCOUNTER — Ambulatory Visit: Payer: Self-pay | Admitting: Family Medicine

## 2009-11-09 DIAGNOSIS — B0229 Other postherpetic nervous system involvement: Secondary | ICD-10-CM | POA: Insufficient documentation

## 2009-12-08 ENCOUNTER — Encounter: Payer: Self-pay | Admitting: Family Medicine

## 2009-12-08 LAB — HM DIABETES EYE EXAM

## 2009-12-13 ENCOUNTER — Encounter: Payer: Self-pay | Admitting: Family Medicine

## 2010-01-10 ENCOUNTER — Encounter: Payer: Self-pay | Admitting: Family Medicine

## 2010-01-25 ENCOUNTER — Encounter: Payer: Self-pay | Admitting: Family Medicine

## 2010-02-14 ENCOUNTER — Ambulatory Visit: Payer: Self-pay | Admitting: Family Medicine

## 2010-02-14 LAB — CONVERTED CEMR LAB
Albumin: 4 g/dL (ref 3.5–5.2)
BUN: 33 mg/dL — ABNORMAL HIGH (ref 6–23)
CO2: 30 meq/L (ref 19–32)
Calcium: 9.2 mg/dL (ref 8.4–10.5)
Chloride: 108 meq/L (ref 96–112)
Creatinine, Ser: 1.5 mg/dL — ABNORMAL HIGH (ref 0.4–1.2)
Creatinine,U: 286 mg/dL
GFR calc non Af Amer: 34.81 mL/min (ref >60)
Glucose, Bld: 151 mg/dL — ABNORMAL HIGH (ref 70–99)
Hgb A1c MFr Bld: 8.5 % — ABNORMAL HIGH (ref 4.6–6.5)
Microalb Creat Ratio: 0.6 mg/g (ref 0.0–30.0)
Microalb, Ur: 1.7 mg/dL (ref 0.0–1.9)
Phosphorus: 4.2 mg/dL (ref 2.3–4.6)
Potassium: 4.3 meq/L (ref 3.5–5.1)
Sodium: 143 meq/L (ref 135–145)

## 2010-02-17 ENCOUNTER — Ambulatory Visit: Payer: Self-pay | Admitting: Family Medicine

## 2010-02-17 ENCOUNTER — Telehealth: Payer: Self-pay | Admitting: Family Medicine

## 2010-03-16 ENCOUNTER — Encounter: Payer: Self-pay | Admitting: Family Medicine

## 2010-03-16 LAB — HM MAMMOGRAPHY: HM Mammogram: NORMAL

## 2010-03-22 ENCOUNTER — Encounter: Payer: Self-pay | Admitting: Family Medicine

## 2010-03-27 ENCOUNTER — Telehealth: Payer: Self-pay | Admitting: Family Medicine

## 2010-03-27 ENCOUNTER — Ambulatory Visit: Payer: Self-pay | Admitting: Family Medicine

## 2010-03-27 LAB — CONVERTED CEMR LAB
Bilirubin Urine: NEGATIVE
Blood in Urine, dipstick: NEGATIVE
Casts: 0 /lpf
Glucose, Urine, Semiquant: NEGATIVE
Ketones, urine, test strip: NEGATIVE
Nitrite: NEGATIVE
Protein, U semiquant: NEGATIVE
RBC / HPF: 0
Specific Gravity, Urine: 1.02
Urine crystals, microscopic: 0 /hpf
Urobilinogen, UA: 0.2
Yeast, UA: 0
pH: 6

## 2010-03-28 ENCOUNTER — Encounter: Payer: Self-pay | Admitting: Family Medicine

## 2010-03-31 ENCOUNTER — Encounter: Payer: Self-pay | Admitting: Family Medicine

## 2010-04-03 ENCOUNTER — Encounter: Payer: Self-pay | Admitting: Family Medicine

## 2010-04-14 ENCOUNTER — Ambulatory Visit: Payer: Self-pay | Admitting: Family Medicine

## 2010-05-16 ENCOUNTER — Ambulatory Visit: Payer: Self-pay | Admitting: Family Medicine

## 2010-05-16 LAB — CONVERTED CEMR LAB
ALT: 21 units/L (ref 0–35)
AST: 22 units/L (ref 0–37)
Albumin: 4.3 g/dL (ref 3.5–5.2)
BUN: 23 mg/dL (ref 6–23)
CO2: 30 meq/L (ref 19–32)
Calcium: 9.4 mg/dL (ref 8.4–10.5)
Chloride: 107 meq/L (ref 96–112)
Cholesterol: 185 mg/dL (ref 0–200)
Creatinine, Ser: 1.5 mg/dL — ABNORMAL HIGH (ref 0.4–1.2)
GFR calc non Af Amer: 35.86 mL/min (ref >60)
Glucose, Bld: 86 mg/dL (ref 70–99)
HDL: 34 mg/dL — ABNORMAL LOW (ref >39.00)
Hgb A1c MFr Bld: 6.8 % — ABNORMAL HIGH (ref 4.6–6.5)
LDL Cholesterol: 123 mg/dL — ABNORMAL HIGH (ref 0–99)
Phosphorus: 4.1 mg/dL (ref 2.3–4.6)
Potassium: 4.4 meq/L (ref 3.5–5.1)
Sodium: 143 meq/L (ref 135–145)
Total CHOL/HDL Ratio: 5
Triglycerides: 138 mg/dL (ref 0.0–149.0)
VLDL: 27.6 mg/dL (ref 0.0–40.0)

## 2010-05-20 DIAGNOSIS — I639 Cerebral infarction, unspecified: Secondary | ICD-10-CM

## 2010-05-20 HISTORY — DX: Cerebral infarction, unspecified: I63.9

## 2010-05-23 ENCOUNTER — Ambulatory Visit: Payer: Self-pay | Admitting: Family Medicine

## 2010-06-02 ENCOUNTER — Ambulatory Visit: Payer: Self-pay | Admitting: Cardiology

## 2010-06-02 ENCOUNTER — Inpatient Hospital Stay (HOSPITAL_COMMUNITY): Admission: EM | Admit: 2010-06-02 | Discharge: 2010-06-07 | Payer: Self-pay | Admitting: Emergency Medicine

## 2010-06-04 ENCOUNTER — Encounter (INDEPENDENT_AMBULATORY_CARE_PROVIDER_SITE_OTHER): Payer: Self-pay | Admitting: Diagnostic Neuroimaging

## 2010-06-05 ENCOUNTER — Ambulatory Visit: Payer: Self-pay | Admitting: Physical Medicine & Rehabilitation

## 2010-06-05 ENCOUNTER — Encounter (INDEPENDENT_AMBULATORY_CARE_PROVIDER_SITE_OTHER): Payer: Self-pay | Admitting: Diagnostic Neuroimaging

## 2010-06-07 ENCOUNTER — Inpatient Hospital Stay (HOSPITAL_COMMUNITY)
Admission: RE | Admit: 2010-06-07 | Discharge: 2010-06-21 | Payer: Self-pay | Admitting: Physical Medicine & Rehabilitation

## 2010-06-21 ENCOUNTER — Encounter: Payer: Self-pay | Admitting: Family Medicine

## 2010-07-03 ENCOUNTER — Encounter: Payer: Self-pay | Admitting: Physical Medicine & Rehabilitation

## 2010-07-05 ENCOUNTER — Ambulatory Visit: Payer: Self-pay | Admitting: Family Medicine

## 2010-07-05 ENCOUNTER — Encounter (INDEPENDENT_AMBULATORY_CARE_PROVIDER_SITE_OTHER): Payer: Self-pay | Admitting: *Deleted

## 2010-07-05 DIAGNOSIS — I69959 Hemiplegia and hemiparesis following unspecified cerebrovascular disease affecting unspecified side: Secondary | ICD-10-CM | POA: Insufficient documentation

## 2010-07-06 ENCOUNTER — Encounter: Payer: Self-pay | Admitting: Family Medicine

## 2010-07-20 ENCOUNTER — Encounter: Payer: Self-pay | Admitting: Physical Medicine & Rehabilitation

## 2010-07-31 ENCOUNTER — Encounter
Admission: RE | Admit: 2010-07-31 | Discharge: 2010-08-04 | Payer: Self-pay | Source: Home / Self Care | Attending: Physical Medicine & Rehabilitation | Admitting: Physical Medicine & Rehabilitation

## 2010-08-01 ENCOUNTER — Telehealth: Payer: Self-pay | Admitting: Family Medicine

## 2010-08-01 ENCOUNTER — Encounter: Payer: Self-pay | Admitting: Family Medicine

## 2010-08-04 ENCOUNTER — Ambulatory Visit: Payer: Self-pay | Admitting: Physical Medicine & Rehabilitation

## 2010-08-09 ENCOUNTER — Encounter: Payer: Self-pay | Admitting: Family Medicine

## 2010-08-20 ENCOUNTER — Encounter: Payer: Self-pay | Admitting: Physical Medicine & Rehabilitation

## 2010-08-22 ENCOUNTER — Ambulatory Visit
Admission: RE | Admit: 2010-08-22 | Discharge: 2010-08-22 | Payer: Self-pay | Source: Home / Self Care | Attending: Family Medicine | Admitting: Family Medicine

## 2010-08-23 LAB — CONVERTED CEMR LAB
ALT: 17 units/L (ref 0–35)
AST: 19 units/L (ref 0–37)
Albumin: 3.6 g/dL (ref 3.5–5.2)
BUN: 24 mg/dL — ABNORMAL HIGH (ref 6–23)
CO2: 26 meq/L (ref 19–32)
Calcium: 9.4 mg/dL (ref 8.4–10.5)
Chloride: 105 meq/L (ref 96–112)
Cholesterol: 172 mg/dL (ref 0–200)
Creatinine, Ser: 1.5 mg/dL — ABNORMAL HIGH (ref 0.4–1.2)
GFR calc non Af Amer: 34.5 mL/min — ABNORMAL LOW (ref >60.00)
Glucose, Bld: 131 mg/dL — ABNORMAL HIGH (ref 70–99)
HDL: 29.5 mg/dL — ABNORMAL LOW (ref >39.00)
Hgb A1c MFr Bld: 5.7 % (ref 4.6–6.5)
LDL Cholesterol: 118 mg/dL — ABNORMAL HIGH (ref 0–99)
Phosphorus: 3.7 mg/dL (ref 2.3–4.6)
Potassium: 4.1 meq/L (ref 3.5–5.1)
Sodium: 141 meq/L (ref 135–145)
Total CHOL/HDL Ratio: 6
Triglycerides: 124 mg/dL (ref 0.0–149.0)
VLDL: 24.8 mg/dL (ref 0.0–40.0)

## 2010-08-25 ENCOUNTER — Ambulatory Visit
Admission: RE | Admit: 2010-08-25 | Discharge: 2010-08-25 | Payer: Self-pay | Source: Home / Self Care | Attending: Family Medicine | Admitting: Family Medicine

## 2010-08-25 DIAGNOSIS — F419 Anxiety disorder, unspecified: Secondary | ICD-10-CM | POA: Insufficient documentation

## 2010-08-25 DIAGNOSIS — F418 Other specified anxiety disorders: Secondary | ICD-10-CM | POA: Insufficient documentation

## 2010-08-25 DIAGNOSIS — F32A Depression, unspecified: Secondary | ICD-10-CM | POA: Insufficient documentation

## 2010-09-01 ENCOUNTER — Encounter
Admission: RE | Admit: 2010-09-01 | Discharge: 2010-09-05 | Payer: Self-pay | Source: Home / Self Care | Attending: Physical Medicine & Rehabilitation | Admitting: Physical Medicine & Rehabilitation

## 2010-09-05 ENCOUNTER — Ambulatory Visit
Admission: RE | Admit: 2010-09-05 | Discharge: 2010-09-05 | Payer: Self-pay | Source: Home / Self Care | Attending: Physical Medicine & Rehabilitation | Admitting: Physical Medicine & Rehabilitation

## 2010-09-07 ENCOUNTER — Telehealth: Payer: Self-pay | Admitting: Family Medicine

## 2010-09-10 ENCOUNTER — Encounter: Payer: Self-pay | Admitting: Family Medicine

## 2010-09-19 ENCOUNTER — Encounter: Payer: Self-pay | Admitting: Family Medicine

## 2010-09-19 ENCOUNTER — Telehealth: Payer: Self-pay | Admitting: Family Medicine

## 2010-09-19 NOTE — Miscellaneous (Signed)
Summary: med list update  Clinical Lists Changes  Medications: Added new medication of KEFLEX 250 MG CAPS (CEPHALEXIN) take one capsule by mouth two times a day for 7 days     Current Allergies: ! VOLTAREN ! * ACTOS

## 2010-09-19 NOTE — Consult Note (Signed)
Summary: Anthonette Legato, MD  Anthonette Legato, MD   Imported By: Edmonia James 08/29/2009 10:53:52  _____________________________________________________________________  External Attachment:    Type:   Image     Comment:   External Document

## 2010-09-19 NOTE — Assessment & Plan Note (Signed)
Summary: ?UTI/CLE   Vital Signs:  Patient profile:   77 year old female Height:      63 inches Weight:      222.75 pounds BMI:     39.60 Temp:     97.9 degrees F oral Pulse rate:   68 / minute Pulse rhythm:   regular BP sitting:   130 / 80  (left arm) Cuff size:   large  Vitals Entered By: Ozzie Hoyle (Jan 04, 2009 2:03 PM) CC: frequency of urine Pain Assessment Patient in pain? yes     Location: lower abdomen Intensity: 8 Comments Patient states she has frequency of urine and pressure feeling and pain in lower abdomen   CC:  frequency of urine.  History of Present Illness: Here for ?UTI --onset x3wks--worse past 1 wk--tender across both lower quads --frequency, urgency, nocturia x 2--more than usual, no fever or chills --no vaginal discharge  --stomach virus 3 wks ago and has not felt well since--resolved diarrhea,  back to nl diet --eating lots fresh vegs--having lots of gas  Has had edema both ankles and lower legs since increase to Actos 30-- CBG this morning was 139  Allergies: 1)  ! Voltaren  Past History:  Past Medical History:    Reviewed history from 12/20/2008 and no changes required:    Diabetes mellitus, type II    Hypertension    Osteoarthritis    Hyperlipidemia    angioedema was poss from voltaren    vitamin b12 deficiency 4/08    LVH and atrial enlargement by echo in past with nl EF    degenerative disk dz    osteopenia             ortho -- Dr Tonita Cong    GI - Dr Amedeo Plenty   Review of Systems      See HPI  Physical Exam  General:  alert, well-developed, well-nourished, well-hydrated, and overweight-appearing.  NAD Abdomen:  tender in both lower quads, L>R and suprapubic area.  surgical scars in both lower quads, some guarding soft, normal bowel sounds, no distention, and no masses.  no CVA tenderness   Rectal:  guiac neg, normal sphincter tone and no masses.   Extremities:  1+ pitting R lower leg and ankle trace to 1+ pitting L lower leg  and ankle Neurologic:  alert & oriented X3 and gait normal.   Skin:  skin on both feet intact Psych:  normally interactive and good eye contact.     Impression & Recommendations:  Problem # 1:  UTI (ICD-599.0) Assessment New due to neg U/A will wait for urine culture to treat as abd pain may not be from UTI and do not want to start ABT if not needed tx as soon as culture returned Her updated medication list for this problem includes:    Tetracycline Hcl 500 Mg Caps (Tetracycline hcl) .Marland Kitchen... Take one by mouth daily    Vesicare 10 Mg Tabs (Solifenacin succinate) .Marland Kitchen... Take one by mouth daily  Orders: T-Culture, Urine [id number]) UA Dipstick W/ Micro (manual) (81000)  Problem # 2:  EDEMA (ICD-782.3) Assessment: New new since Actos--will get sooner appt with Dr Glori Bickers than the 01/2009 one she has   Complete Medication List: 1)  Lopid 600 Mg Tabs (Gemfibrozil) .... Take one by mouth daily 2)  Mavik 4 Mg Tabs (Trandolapril) .Marland Kitchen.. 1 by mouth once daily 3)  Estrace 1 Mg Tabs (Estradiol) .... 1/2 tab by mouth once daily 4)  Calcium Lactate 750 Mg  Tabs (Calcium lactate) .... Take two by mouth daily 5)  Tetracycline Hcl 500 Mg Caps (Tetracycline hcl) .... Take one by mouth daily 6)  Vitamin B-12 Cr 1000 Mcg Tbcr (Cyanocobalamin) .... One injection every 8 weeks 7)  Vesicare 10 Mg Tabs (Solifenacin succinate) .... Take one by mouth daily 8)  Vitamin D 1000 Iu  9)  Actos 30 Mg Tabs (Pioglitazone hcl) .Marland Kitchen.. 1 by mouth once daily 10)  Centrum Tabs (Multiple vitamins-minerals) .... Take 1 tablet by mouth once a day  Patient Instructions: 1)  make appt with Dr Laurena Slimmer lower legs    Current Allergies (reviewed today): ! VOLTAREN Laboratory Results   Urine Tests  Date/Time Received: Jan 04, 2009 2:08 PM  Date/Time Reported: Jan 04, 2009 2:08 PM   Routine Urinalysis   Color: yellow Appearance: Hazy Glucose: negative   (Normal Range: Negative) Bilirubin: negative   (Normal  Range: Negative) Ketone: negative   (Normal Range: Negative) Spec. Gravity: 1.020   (Normal Range: 1.003-1.035) Blood: negative   (Normal Range: Negative) pH: 6.0   (Normal Range: 5.0-8.0) Protein: trace   (Normal Range: Negative) Urobilinogen: 0.2   (Normal Range: 0-1) Nitrite: negative   (Normal Range: Negative) Leukocyte Esterace: negative   (Normal Range: Negative)  Urine Microscopic WBC/HPF: 0-2 RBC/HPF: 0-1 Bacteria/HPF: 0 Epithelial/HPF: occ Casts/LPF: 0        Laboratory Results   Urine Tests    Routine Urinalysis   Color: yellow Appearance: Hazy Glucose: negative   (Normal Range: Negative) Bilirubin: negative   (Normal Range: Negative) Ketone: negative   (Normal Range: Negative) Spec. Gravity: 1.020   (Normal Range: 1.003-1.035) Blood: negative   (Normal Range: Negative) pH: 6.0   (Normal Range: 5.0-8.0) Protein: trace   (Normal Range: Negative) Urobilinogen: 0.2   (Normal Range: 0-1) Nitrite: negative   (Normal Range: Negative) Leukocyte Esterace: negative   (Normal Range: Negative)  Urine Microscopic WBC/hpf: 0-2 RBC/hpf: 0-1 Bacteria: 0 Epithelial: occ Casts/LPF: 0

## 2010-09-19 NOTE — Assessment & Plan Note (Signed)
Summary: 6 month follow up/rbh   Vital Signs:  Patient Profile:   77 Years Old Female Weight:      212 pounds Temp:     97.7 degrees F oral Pulse rate:   64 / minute Pulse rhythm:   regular BP sitting:   160 / 80  (left arm) Cuff size:   large  Vitals Entered By: Marty Heck (January 26, 2008 8:27 AM)                 Chief Complaint:  6 month follow up.  History of Present Illness: some trouble with r ankle swelling- goes down at night  it does not hurt thinks this is the ankle with arthritis in it  weather getting warmer not feeling great   under a lot of pressure gained quite a bit of wt bp is up  has not taken bp yet  needs to quit eating so much   cannot exercise much due to her back pain  gets frutsrated at times but no depression-- much stress  knows how to follow DM diet     Past Medical History:    Reviewed history from 12/03/2006 and no changes required:       Diabetes mellitus, type II       Hypertension       Osteoarthritis       Hyperlipidemia       angioedema was poss from voltaren       vitamin b12 deficiency 4/08  Past Surgical History:    Reviewed history from 11/27/2006 and no changes required:       Cataract extraction       Hysterectomy, BSO- fibroids       LVH-  EF 60%       Echo- bi-atrial enlargement, mild (05/1995)       Colonoscopy- no polyps (1995),   divertic., ? polyp (06/1998)  , diverticulosis (08/2003)       L4- L5 disk disease- epidural inj. 12/1998       Dexa- osteopenia (04/2002) , stable (10/20050       Knee surgery- arthroscope     Review of Systems  General      Denies fever, loss of appetite, and malaise.  Eyes      Denies blurring and eye pain.  CV      Complains of swelling of feet.      Denies chest pain or discomfort, palpitations, and shortness of breath with exertion.  Resp      Denies cough and shortness of breath.  GI      Denies bloody stools, change in bowel habits, and nausea.  MS  Complains of joint pain and low back pain.      Denies joint redness and joint swelling.  Derm      Denies dryness and rash.  Neuro      Denies numbness and tingling.  Psych      mood has been ok    Physical Exam  General:     overweight but generally well appearing  Head:     normocephalic and atraumatic.   Eyes:     vision grossly intact, pupils equal, pupils round, pupils reactive to light, and no injection.   Neck:     No deformities, masses, or tenderness noted. Lungs:     Normal respiratory effort, chest expands symmetrically. Lungs are clear to auscultation, no crackles or wheezes. Heart:     Normal rate and regular  rhythm. S1 and S2 normal without gallop, murmur, click, rub or other extra sounds. Abdomen:     no masses or renal bruits soft, non-tender, normal bowel sounds, and no distention.   Msk:     poor rom of LS and TS no acute joint changes  Pulses:     R and L carotid,radial,femoral,dorsalis pedis and posterior tibial pulses are full and equal bilaterally Extremities:     R ankle with tr-1 plus edema L ankle tr edema some varicosities- spider viens Neurologic:     strength normal in all extremities, sensation intact to light touch, gait normal, and DTRs symmetrical and normal.   Skin:     Intact without suspicious lesions or rashes Cervical Nodes:     No lymphadenopathy noted Psych:     normal affect, talkative and pleasant     Impression & Recommendations:  Problem # 1:  BACK PAIN (ICD-724.5) Assessment: Deteriorated ongoing with deg disc--disabling her from being active  will ref to ortho for f/u to make plan  Orders: Orthopedic Referral (Ortho)   Problem # 2:  HYPERLIPIDEMIA (ICD-272.4) Assessment: Unchanged disc cutting down red meat intake and continue lopid labs today and f/u 3 mo Her updated medication list for this problem includes:    Lopid 600 Mg Tabs (Gemfibrozil) .Marland Kitchen... Take one by mouth daily  Orders: Venipuncture  [id number]) TLB-Lipid Panel (80061-LIPID) TLB-ALT (SGPT) (84460-ALT) TLB-AST (SGOT) (84450-SGOT) TLB-A1C / Hgb A1C (Glycohemoglobin) (83036-A1C)  Labs Reviewed: Chol: 144 (09/08/2007)   HDL: 30.6 (09/08/2007)   LDL: 99 (09/08/2007)   TG: 72 (09/08/2007) SGOT: 16 (09/08/2007)   SGPT: 16 (09/08/2007)   Problem # 3:  HYPERTENSION (ICD-401.9) Assessment: Unchanged bp up today- did not take meds will check outside the office and update f/u 3 mo work on diet and exercise (water exercise may be good low impact option) Her updated medication list for this problem includes:    Mavik 2 Mg Tabs (Trandolapril) .Marland Kitchen... Take one by mouth daily  Orders: Venipuncture [id number]) TLB-Renal Function Panel (80069-RENAL)  BP today: 160/80 Prior BP: 140/72 (10/03/2007)  Labs Reviewed: Creat: 1.3 (09/08/2007) Chol: 144 (09/08/2007)   HDL: 30.6 (09/08/2007)   LDL: 99 (09/08/2007)   TG: 72 (09/08/2007)   Problem # 4:  DIABETES MELLITUS, TYPE II (ICD-250.00) Assessment: Unchanged per pt- fair control - low 100s to 130 usually enc to keep eating healthy labs today Her updated medication list for this problem includes:    Mavik 2 Mg Tabs (Trandolapril) .Marland Kitchen... Take one by mouth daily    Glucotrol Xl 10 Mg Tb24 (Glipizide) .Marland Kitchen... Take 1 tablet by mouth once a day  Orders: Venipuncture [id number]) TLB-A1C / Hgb A1C (Glycohemoglobin) (83036-A1C)  Labs Reviewed: HgBA1c: 6.1 (09/08/2007)   Creat: 1.3 (09/08/2007)      Problem # 5:  EDEMA (ICD-782.3) Assessment: New worse in R foot with wt gain, warmer weather and hx of OA in that foot disc keeping up water intake and avoid salt elevate foot and work on wt loss update if worse- or if any cardiac symptms re check 3 mo   Problem # 6:  B12 DEFICIENCY (ICD-266.2) Assessment: Unchanged for shot today-- does help symptomatically Orders: Admin of Therapeutic Inj  intramuscular or subcutaneous [id number]) Vit B12 1000 mcg (J3420)   Complete Medication List: 1)   Lopid 600 Mg Tabs (Gemfibrozil) .... Take one by mouth daily 2)  Mavik 2 Mg Tabs (Trandolapril) .... Take one by mouth daily 3)  Estrace 0.1 Mg/gm Crea (Estradiol) .... Take  one half by mouth daily 4)  Calcium Lactate 750 Mg Tabs (Calcium lactate) .... Take two by mouth daily 5)  Tetracycline Hcl 500 Mg Caps (Tetracycline hcl) .... Take one by mouth daily 6)  Vitamin B-12 Cr 1000 Mcg Tbcr (Cyanocobalamin) .... One injection every 8 weeks 7)  Vesicare 10 Mg Tabs (Solifenacin succinate) .... Take one by mouth daily 8)  Anusol-hc 25 Mg Supp (Hydrocortisone acetate) .Marland Kitchen.. 1 pr at bedtime for 7-14 days 9)  Vitamin D 1000 Iu  10)  Diflucan 150 Mg Tabs (Fluconazole) .Marland Kitchen.. 1 by mouth times one as needed yeast infection 11)  Glucotrol Xl 10 Mg Tb24 (Glipizide) .... Take 1 tablet by mouth once a day   Patient Instructions: 1)  we will set you up with orthopedics at check out-- Rosaria Ferries may have to pull your chart to see who saw you originally 2)  really work on diet and exercise the best that you can  3)  keep and eye on your sugars 4)  f/u 3 mo- take your medicines that day   ] Prior Medications: LOPID 600 MG  TABS (GEMFIBROZIL) take one by mouth daily MAVIK 2 MG  TABS (TRANDOLAPRIL) take one by mouth daily ESTRACE 0.1 MG/GM  CREA (ESTRADIOL) take one half by mouth daily CALCIUM LACTATE 750 MG  TABS (CALCIUM LACTATE) take two by mouth daily TETRACYCLINE HCL 500 MG  CAPS (TETRACYCLINE HCL) take one by mouth daily VITAMIN B-12 CR 1000 MCG  TBCR (CYANOCOBALAMIN) one injection every 8 weeks VESICARE 10 MG  TABS (SOLIFENACIN SUCCINATE) take one by mouth daily ANUSOL-HC 25 MG  SUPP (HYDROCORTISONE ACETATE) 1 pr at bedtime for 7-14 days VITAMIN D 1000 IU ()  DIFLUCAN 150 MG  TABS (FLUCONAZOLE) 1 by mouth times one as needed yeast infection GLUCOTROL XL 10 MG TB24 (GLIPIZIDE) Take 1 tablet by mouth once a day    Medication Administration  Injection # 1:    Medication: Vit B12 1000 mcg     Diagnosis: B12 DEFICIENCY (ICD-266.2)    Route: IM    Site: L deltoid    Exp Date: 02/11    Lot #: [id number]    Mfr: [employer]    Patient tolerated injection without complications    Given by: Marty Heck (January 26, 2008 8:55 AM)  Orders Added: 1)  Venipuncture [id number] 2)  TLB-Renal Function Panel [80069-RENAL] 3)  TLB-Lipid Panel [80061-LIPID] 4)  TLB-ALT (SGPT) [84460-ALT] 5)  TLB-AST (SGOT) [84450-SGOT] 6)  TLB-A1C / Hgb A1C (Glycohemoglobin) [83036-A1C] 7)  Admin of Therapeutic Inj  intramuscular or subcutaneous [96372] 8)  Vit B12 1000 mcg [J3420] 9)  Orthopedic Referral [Ortho] 10)  Est. Patient Level IV [id number]

## 2010-09-19 NOTE — Assessment & Plan Note (Signed)
Summary: FEET ARE RED AND SWOLLEN   Vital Signs:  Patient profile:   77 year old female Height:      63 inches Weight:      223 pounds BMI:     39.65 Temp:     97.5 degrees F oral Pulse rate:   72 / minute Pulse rhythm:   regular BP sitting:   130 / 80  (left arm) Cuff size:   regular  Vitals Entered By: [name] CMA (March 11, 2009 11:51 AM)  History of Present Illness: here for f/u for swollen feet  last visit did stop actos for swelling and wt gain  had called to update - saying that it was 50% better at that time has stayed about the same  sugar control was worse but not severely bad  in ams - sugar runs usually am 130-140s , occ check in afternoons -- yestday was 190 which was high -- usually 160-170   has been concerned because feet have turned more red in color  they are hurting - sort of a burning soreness - especially around her ankles  they itch too  no peeling or flaking or other skin change  diet is the same   has been up on her feet a lot --freezing corn   does not think she has any insect bites   does generally feel better   bp good toay at 130/80 wt is down 5 lb   hx of renal insuff -- now nl cr 1.1   some cramps in legs at night  nl K in june   thinks she is due for B12 shot    Allergies: 1)  ! Voltaren  Review of Systems General:  Denies fatigue, fever, loss of appetite, and malaise. Eyes:  Denies blurring. CV:  Complains of swelling of feet; denies chest pain or discomfort, palpitations, shortness of breath with exertion, swelling of hands, and weight gain; no PND or orthopnea . Resp:  Denies cough, shortness of breath, and wheezing. GI:  Denies abdominal pain, change in bowel habits, and indigestion. GU:  Denies urinary frequency. MS:  Complains of cramps; has had lifelong night leg cramps no claudication symptoms. Derm:  Denies lesion(s), poor wound healing, and rash. Neuro:  Denies numbness and tingling.  Physical  Exam  General:  overweight but generally well appearing  Head:  normocephalic, atraumatic, and no abnormalities observed.   Eyes:  vision grossly intact, pupils equal, pupils round, and pupils reactive to light.  no conjunctival pallor, injection or icterus  Mouth:  pharynx pink and moist.   Neck:  supple with full rom and no masses or thyromegally, no JVD or carotid bruit  Chest Wall:  No deformities, masses, or tenderness noted. Lungs:  Normal respiratory effort, chest expands symmetrically. Lungs are clear to auscultation, no crackles or wheezes. Heart:  Normal rate and regular rhythm. S1 and S2 normal without gallop, murmur, click, rub or other extra sounds. Abdomen:  soft, non-tender, normal bowel sounds, no distention, no masses, no hepatomegaly, and no splenomegaly.  no renal bruits  Msk:  No deformity or scoliosis noted of thoracic or lumbar spine.   Pulses:  plus one pedal pulses Extremities:  edema ankles - is less than 1plus  - slt pitting non tender slt rubor and warmth insect bite healing R ankle nlrom Neurologic:  sensation intact to light touch, sensation intact to pinprick, and gait normal.   Skin:  Intact without suspicious lesions or rashes Inguinal Nodes:  No significant adenopathy Psych:  normal affect, talkative and pleasant    Impression & Recommendations:  Problem # 1:  EDEMA (ICD-782.3) Assessment Improved improved off actos- but still bothersome - esp with hot weather and prolonged standing pt not wanting supp hose due to heat disc sodium restriction/inc water/ elevate feet when able  trial of lasix (watching K and renal fxn closely) update if not imp  lab 2 week f/u aug as planned  Her updated medication list for this problem includes:    Lasix 20 Mg Tabs (Furosemide) .Marland Kitchen... 1 by mouth each am  Problem # 2:  DIABETES MELLITUS, TYPE II (ICD-250.00) overall sugars are higher off actos as expected (but not severe)  once swelling is imp and renal panel  obt- will disc starting another med for sugar control (had disc januvia, avoiding meformin due to renal ins) The following medications were removed from the medication list:    Actos 30 Mg Tabs (Pioglitazone hcl) .Marland Kitchen... 1 by mouth once daily (holding for edema) Her updated medication list for this problem includes:    Mavik 4 Mg Tabs (Trandolapril) .Marland Kitchen... 1 by mouth once daily  Problem # 3:  B12 DEFICIENCY (ICD-266.2) Assessment: Unchanged overdue shot- today Orders: Admin of Therapeutic Inj  intramuscular or subcutaneous [id number]) Vit B12 1000 mcg (J3420)  Complete Medication List: 1)  Lopid 600 Mg Tabs (Gemfibrozil) .... Take one by mouth daily 2)  Mavik 4 Mg Tabs (Trandolapril) .Marland Kitchen.. 1 by mouth once daily 3)  Estrace 1 Mg Tabs (Estradiol) .... 1/2 tab by mouth once daily 4)  Calcium Lactate 750 Mg Tabs (Calcium lactate) .... Take two by mouth daily 5)  Tetracycline Hcl 500 Mg Caps (Tetracycline hcl) .... Take one by mouth daily 6)  Vitamin B-12 Cr 1000 Mcg Tbcr (Cyanocobalamin) .... One injection every 8 weeks 7)  Vesicare 10 Mg Tabs (Solifenacin succinate) .... Take one by mouth daily 8)  Vitamin D 1000 Iu  9)  Centrum Tabs (Multiple vitamins-minerals) .... Take 1 tablet by mouth once a day 10)  Lasix 20 Mg Tabs (Furosemide) .Marland Kitchen.. 1 by mouth each am  Patient Instructions: 1)  start lasix 20 mg one pill daily in am (this will likely make you urinate more )  2)  let me know if your swelling does not start to improve in 3-4 days  3)  watch diet for salt and try to keep feet cool and elevate your feet whenever you can  4)  try diet tonic water 4-8 ounces each evening (this has small amount of quinine in it - that can help cramps)  5)  follow up in august as planned  6)  schedule labs in 2 weeks  7)  then will decide what to add for your diabetes  8)  try to stick to diabetic diet  Prescriptions: LASIX 20 MG TABS (FUROSEMIDE) 1 by mouth each am  #30 x 3   Entered and Authorized by:    [name] MD   Signed by:   [name] MD on 03/11/2009   Method used:   Print then Give to Patient   RxID:   [id number]   Prior Medications (reviewed today): LOPID 600 MG  TABS (GEMFIBROZIL) take one by mouth daily MAVIK 4 MG TABS (TRANDOLAPRIL) 1 by mouth once daily ESTRACE 1 MG TABS (ESTRADIOL) 1/2 tab by mouth once daily CALCIUM LACTATE 750 MG  TABS (CALCIUM LACTATE) take two by mouth daily TETRACYCLINE HCL 500 MG  CAPS (TETRACYCLINE  HCL) take one by mouth daily VITAMIN B-12 CR 1000 MCG  TBCR (CYANOCOBALAMIN) one injection every 8 weeks VESICARE 10 MG  TABS (SOLIFENACIN SUCCINATE) take one by mouth daily VITAMIN D 1000 IU ()  CENTRUM  TABS (MULTIPLE VITAMINS-MINERALS) Take 1 tablet by mouth once a day Current Allergies (reviewed today): ! VOLTAREN Current Medications (including changes made in today's visit):  LOPID 600 MG  TABS (GEMFIBROZIL) take one by mouth daily MAVIK 4 MG TABS (TRANDOLAPRIL) 1 by mouth once daily ESTRACE 1 MG TABS (ESTRADIOL) 1/2 tab by mouth once daily CALCIUM LACTATE 750 MG  TABS (CALCIUM LACTATE) take two by mouth daily TETRACYCLINE HCL 500 MG  CAPS (TETRACYCLINE HCL) take one by mouth daily VITAMIN B-12 CR 1000 MCG  TBCR (CYANOCOBALAMIN) one injection every 8 weeks VESICARE 10 MG  TABS (SOLIFENACIN SUCCINATE) take one by mouth daily * VITAMIN D 1000 IU  CENTRUM  TABS (MULTIPLE VITAMINS-MINERALS) Take 1 tablet by mouth once a day LASIX 20 MG TABS (FUROSEMIDE) 1 by mouth each am    Medication Administration  Injection # 1:    Medication: Vit B12 1000 mcg    Diagnosis: B12 DEFICIENCY (ICD-266.2)    Route: IM    Site: R deltoid    Exp Date: 08/19/2010    Lot #: [id number]    Mfr: [employer] Regent    Patient tolerated injection without complications    Given by: [place] (March 11, 2009 4:40 PM)  Orders Added: 1)  Admin of Therapeutic Inj  intramuscular or subcutaneous [96372] 2)  Vit B12 1000 mcg [J3420] 3)  Est. Patient  Level III [id number]

## 2010-09-19 NOTE — Assessment & Plan Note (Signed)
Summary: 2 MO. F/U/BIR   Vital Signs:  Patient profile:   77 year old female Height:      63 inches Weight:      222 pounds BMI:     39.47 Temp:     97.7 degrees F oral Pulse rate:   64 / minute Pulse rhythm:   regular BP sitting:   132 / 80  (left arm) Cuff size:   large  Vitals Entered By: [name] (November 12, 2008 8:05 AM)  Serial Vital Signs/Assessments:  Time      Position  BP       Pulse  Resp  Temp     By                     132/80                         [name] MD   History of Present Illness: has been feeling pretty good  went for therapy- and really glad she went    [facility] is 7.0 nl microalb wt is up 2 lb sugars in am 130s-140s , and later in day has not been taking them  likely needs to increase actos  diet not optimal -- due to bad weather - not getting out  gets on treadmill or bike once per day , also working out in yard for the past 2 weeks   eye exam - has appt on april 7th   HTN- bp still borderline on mavik 4  lipids - last summer HDL 28 and LDL 117- on lopid and sub opt diet  had her colonosc dec- and will go for 3 mo check- due to polyp removal she thinks this is ok- no more blood in stool   dental appt also  needs order for mammogram   back has been itching - tried gold bond- that helps- no rash or skin changes seen or felt   Allergies: 1)  ! Voltaren  Past History:  Past Medical History:    Diabetes mellitus, type II    Hypertension    Osteoarthritis    Hyperlipidemia    angioedema was poss from voltaren    vitamin b12 deficiency 4/08    LVH and atrial enlargement by echo in past with nl EF    degenerative disk dz    ortho -- Dr Tonita Cong    GI - Dr Amedeo Plenty  (08/17/2008)  Past Surgical History:    Cataract extraction    Hysterectomy, BSO- fibroids    LVH-  EF 60%    Echo- bi-atrial enlargement, mild (05/1995)    Colonoscopy- no polyps (1995),   divertic., ? polyp (06/1998)  , diverticulosis (08/2003)    L4- L5 disk  disease- epidural inj. 12/1998    Dexa- osteopenia (04/2002) , stable (10/20050    Knee surgery- arthroscope    spinal decomression sx 8/09    (12/09) colonoscopy polyp/ adenomatous - re check 5 y     (08/17/2008)  Family History:    sister CAD, brain tumor with hemorrhage  (03/02/2008)  Social History:    husband is ill- she cares for him    non smoker     no alcohol     works a lot outside in garden (03/02/2008)  Review of Systems General:  Denies fatigue, fever, loss of appetite, and malaise. Eyes:  Denies blurring and eye irritation. CV:  Denies chest pain or discomfort,  palpitations, and shortness of breath with exertion. Resp:  Denies cough and wheezing. GI:  Denies abdominal pain and change in bowel habits. GU:  Denies dysuria and urinary frequency; vesicare works well. MS:  Complains of low back pain and mid back pain; back pain is better after PT. Derm:  Complains of itching; denies lesion(s) and rash. Neuro:  Denies numbness and tingling. Psych:  mood is better. Endo:  Denies cold intolerance, excessive thirst, excessive urination, and heat intolerance.  Physical Exam  General:  overweight but generally well appearing  Head:  normocephalic, atraumatic, and no abnormalities observed.   Eyes:  vision grossly intact, pupils equal, pupils round, and pupils reactive to light.  no conjunctival pallor, injection or icterus  Mouth:  pharynx pink and moist.   Neck:  supple with full rom and no masses or thyromegally, no JVD or carotid bruit  Lungs:  Normal respiratory effort, chest expands symmetrically. Lungs are clear to auscultation, no crackles or wheezes. Heart:  Normal rate and regular rhythm. S1 and S2 normal without gallop, murmur, click, rub or other extra sounds. Abdomen:  Bowel sounds positive,abdomen soft and non-tender without masses, organomegaly or hernias  no renal bruits  Msk:  No deformity or scoliosis noted of thoracic or lumbar spine.   Pulses:  R and L  carotid,radial,femoral,dorsalis pedis and posterior tibial pulses are full and equal bilaterally Extremities:  No clubbing, cyanosis, edema, or deformity noted with normal full range of motion of all joints.   Neurologic:  sensation intact to light touch, gait normal, and DTRs symmetrical and normal.   Skin:  several superficial excoriations on mid back without signs of infection no rash seen  Cervical Nodes:  No lymphadenopathy noted Psych:  nl affect- talkative today  Diabetes Management Exam:    Foot Exam (with socks and/or shoes not present):       Sensory-Pinprick/Light touch:          Left medial foot (L-4): normal          Left dorsal foot (L-5): normal          Left lateral foot (S-1): normal          Right medial foot (L-4): normal          Right dorsal foot (L-5): normal          Right lateral foot (S-1): normal       Sensory-Monofilament:          Left foot: normal          Right foot: normal       Inspection:          Left foot: normal          Right foot: normal       Nails:          Left foot: normal          Right foot: normal    Eye Exam:       Eye Exam not due   Impression & Recommendations:  Problem # 1:  DIABETES MELLITUS, TYPE II (ICD-250.00) Assessment Unchanged  still not optimal control disc healthy diet (low simple sugar/ choose complex carbs/ low sat fat) diet and exercise in detail  inc actos to 30 mg daily- watching closely for swelling or other side eff  labs then f/u 2-3 mo  work on weight loss The following medications were removed from the medication list:    Glucotrol Xl 10 Mg Tb24 (Glipizide) .Marland KitchenMarland KitchenMarland KitchenMarland Kitchen  Take 1 tablet by mouth once a day Her updated medication list for this problem includes:    Mavik 4 Mg Tabs (Trandolapril) .Marland Kitchen... 1 by mouth once daily    Actos 30 Mg Tabs (Pioglitazone hcl) .Marland Kitchen... 1 by mouth once daily  Labs Reviewed: Creat: 1.1 (11/05/2008)     Last Eye Exam: normal (10/19/2007) Reviewed HgBA1c results: 7.0 (11/05/2008)   7.1 (06/29/2008)  Problem # 2:  RENAL INSUFFICIENCY (ICD-588.9) Assessment: Unchanged stable renal function- adv to keep up water intake and continue to watch  Problem # 3:  B12 DEFICIENCY (ICD-266.2) Assessment: Unchanged  shot today - on 2 mo schedule   Orders: Admin of Therapeutic Inj  intramuscular or subcutaneous [id number]) Vit B12 1000 mcg (J3420)  Problem # 4:  HYPERLIPIDEMIA (ICD-272.4)  on lopid- not opt LDL goal LDL under 70 if poss disc low sat fat diet  labs and f/u 2-3 mo Her updated medication list for this problem includes:    Lopid 600 Mg Tabs (Gemfibrozil) .Marland Kitchen... Take one by mouth daily  Labs Reviewed: SGOT: 19 (08/17/2008)   SGPT: 20 (08/17/2008)   HDL:28.2 (01/26/2008), 30.6 (09/08/2007)  LDL:117 (01/26/2008), 99 (09/08/2007)  Chol:166 (01/26/2008), 144 (09/08/2007)  Trig:103 (01/26/2008), 72 (09/08/2007)  Problem # 5:  OTHER SCREENING MAMMOGRAM (ICD-V76.12) Assessment: Comment Only plan annual mam today Orders: Radiology Referral (Radiology)  Problem # 6:  HYPERTENSION (ICD-401.9) Assessment: Unchanged  better on second check today- max dose mavik disc healthy diet (low simple sugar/ choose complex carbs/ low sat fat) diet and exercise in detail  disc avoiding sodium  f/u 2-3 mo and will add tx if not at goal  Her updated medication list for this problem includes:    Mavik 4 Mg Tabs (Trandolapril) .Marland Kitchen... 1 by mouth once daily  BP today: 140/80- re check 132/80 Prior BP: 142/80 (09/08/2008)  Labs Reviewed: K+: 4.3 (11/05/2008) Creat: : 1.1 (11/05/2008)   Chol: 166 (01/26/2008)   HDL: 28.2 (01/26/2008)   LDL: 117 (01/26/2008)   TG: 103 (01/26/2008)  Problem # 7:  PRURITUS (ICD-698.9) Assessment: New on back with no rash will continue gold bond if it helps  adv to stop fabric softener and change to free detergent and update  Complete Medication List: 1)  Lopid 600 Mg Tabs (Gemfibrozil) .... Take one by mouth daily 2)  Mavik 4 Mg Tabs  (Trandolapril) .Marland Kitchen.. 1 by mouth once daily 3)  Estrace 1 Mg Tabs (Estradiol) .... 1/2 tab by mouth once daily 4)  Calcium Lactate 750 Mg Tabs (Calcium lactate) .... Take two by mouth daily 5)  Tetracycline Hcl 500 Mg Caps (Tetracycline hcl) .... Take one by mouth daily 6)  Vitamin B-12 Cr 1000 Mcg Tbcr (Cyanocobalamin) .... One injection every 8 weeks 7)  Vesicare 10 Mg Tabs (Solifenacin succinate) .... Take one by mouth daily 8)  Vitamin D 1000 Iu  9)  Actos 30 Mg Tabs (Pioglitazone hcl) .Marland Kitchen.. 1 by mouth once daily 10)  Centrum Tabs (Multiple vitamins-minerals) .... Take 1 tablet by mouth once a day  Patient Instructions: 1)  try your best to eat diabetic diet/ work on weight loss and exercise every day  2)  check sugar in am and 2 hours after afternoon meal  3)  we will set up mammogram at check out  4)  increase you actos to total of 30 mg daily (double up on what you have left ) 5)  if any side effects like swelling or shortness of breath- stop the actos and  update me  6)  schedule fasting labs in about 2-3 months lipid /ast /alt /renal/AIC/ 250.0, 272 7)  then follow up  Prescriptions: ESTRACE 1 MG TABS (ESTRADIOL) 1/2 tab by mouth once daily  #90 x 3   Entered and Authorized by:   [name] MD   Signed by:   [name] MD on 11/12/2008   Method used:   Print then Give to Patient   RxID:   [id number] ACTOS 30 MG TABS (PIOGLITAZONE HCL) 1 by mouth once daily  #90 x 1   Entered and Authorized by:   [name] MD   Signed by:   [name] MD on 11/12/2008   Method used:   Print then Give to Patient   RxID:   [id number]       Prior Medications: LOPID 600 MG  TABS (GEMFIBROZIL) take one by mouth daily MAVIK 4 MG TABS (TRANDOLAPRIL) 1 by mouth once daily ESTRACE 1 MG TABS (ESTRADIOL) 1/2 tab by mouth once daily CALCIUM LACTATE 750 MG  TABS (CALCIUM LACTATE) take two by mouth daily TETRACYCLINE HCL 500 MG  CAPS (TETRACYCLINE HCL) take one by mouth  daily VITAMIN B-12 CR 1000 MCG  TBCR (CYANOCOBALAMIN) one injection every 8 weeks VESICARE 10 MG  TABS (SOLIFENACIN SUCCINATE) take one by mouth daily VITAMIN D 1000 IU ()  ACTOS 30 MG TABS (PIOGLITAZONE HCL) 1 by mouth once daily CENTRUM  TABS (MULTIPLE VITAMINS-MINERALS) Take 1 tablet by mouth once a day Current Allergies (reviewed today): ! VOLTAREN    Medication Administration  Injection # 1:    Medication: Vit B12 1000 mcg    Diagnosis: B12 DEFICIENCY (ICD-266.2)    Route: IM    Site: L deltoid    Exp Date: 05/19/2010    Lot #: [id number]    Mfr: American Regent    Patient tolerated injection without complications    Given by: [name] (November 12, 2008 12:23 PM)  Orders Added: 1)  Radiology Referral [Radiology] 2)  Admin of Therapeutic Inj  intramuscular or subcutaneous [[id number]] 3)  Vit B12 1000 mcg [J3420] 4)  Est. Patient Level IV [id number]

## 2010-09-19 NOTE — Assessment & Plan Note (Signed)
Summary: RIGHT LEGPAIN &,HEMRIOID BLEEDING/RBH  Medications Added ANUSOL-HC 25 MG  SUPP (HYDROCORTISONE ACETATE) 1 pr at bedtime for 7-14 days        Vital Signs:  Patient Profile:   77 Years Old Female Weight:      207 pounds Temp:     97.5 degrees F oral Pulse rate:   72 / minute Pulse rhythm:   regular BP sitting:   142 / 70  (left arm) Cuff size:   large  Vitals Entered By: Marty Heck (June 16, 2007 8:32 AM)                 Chief Complaint:  Right leg pain and ? bleeding hemorrhoids.  History of Present Illness: thinks she has bleeding hemorroid- about 2 mo just off and on when she gets constipated sees little streak of brb when she wipes no stool changes or abd pain tries to eat a lot of fiber last colonoscopy was 2005- ok  R leg hurts- can only raise it up so far- 90 deg-for 2-3 mo will bother her if she sits very long will cause pain in middle of shin- throbs ? if coming from knee, and some pain in low back as well not red or swollen particularly has deg disc in back, no hx of knee arthritis       Review of Systems      See HPI  General      Denies chills, fatigue, fever, and loss of appetite.  CV      Denies chest pain or discomfort.  Resp      Denies shortness of breath.  GI      Denies abdominal pain, dark tarry stools, loss of appetite, nausea, and vomiting.  GU      Denies dysuria and urinary frequency.  MS      Complains of joint pain and stiffness.      Denies joint redness, joint swelling, and muscle weakness.      in general difficult to get up/lie down because of stiffness  Derm      Denies changes in color of skin and rash.  Neuro      Denies numbness.  Psych      mood is ok  Heme      Denies abnormal bruising and bleeding.   Physical Exam  General:     overweight but generally well appearing  Eyes:     vision grossly intact, pupils equal, pupils round, and pupils reactive to light.  no pallor or  icterus Mouth:     MMM Neck:     No deformities, masses, or tenderness noted. Lungs:     Normal respiratory effort, chest expands symmetrically. Lungs are clear to auscultation, no crackles or wheezes. Heart:     Normal rate and regular rhythm. S1 and S2 normal without gallop, murmur, click, rub or other extra sounds. Abdomen:     Bowel sounds positive,abdomen soft and non-tender without masses, organomegaly or hernias noted. Rectal:     no tenderness or peri-anal swelling small non thrombosed int hemm at 12:00 that is not actively bleeding, stool is heme neg Msk:     no LS tenderness some discomfort on int rot of R hip SLR yeilds some thigh pain nl rom of R knee with some pain on full flex- no crepitice/swelling/eff Pulses:     R and L carotid,radial,femoral,dorsalis pedis and posterior tibial pulses are full and equal bilaterally Extremities:     No  clubbing, cyanosis, edema, or deformity noted with normal full range of motion of all joints.   Neurologic:     sensation intact to light touch, gait normal, and DTRs symmetrical and normal.   Skin:     Intact without suspicious lesions or rashes- no pallor Cervical Nodes:     No lymphadenopathy noted Inguinal Nodes:     No significant adenopathy Psych:     nl affect, pleasant    Impression & Recommendations:  Problem # 1:  LEG PAIN, RIGHT (ICD-729.5) will check xray knee R and LS some features of early oa knee also hx deg disc- could cause radicular pain will make plan when imaging returns Orders: Diagnostic X-Ray/Fluoroscopy (Diagnostic X-Ray/Flu)   Problem # 2:  RECTAL BLEEDING (ICD-569.3) suspect bleeding painless int hemm will control constipation and use anusol hc if not resolved in 7-14 days- will need GI eval Orders: Anoscopy [id number])   Problem # 3:  B12 DEFICIENCY (ICD-266.2) shot today Orders: Admin of Therapeutic Inj  intramuscular or subcutaneous [id number]) Vit B12 1000 mcg (J3420)   Complete  Medication List: 1)  Lopid 600 Mg Tabs (Gemfibrozil) .... Take one by mouth daily 2)  Mavik 2 Mg Tabs (Trandolapril) .... Take one by mouth daily 3)  Estrace 0.1 Mg/gm Crea (Estradiol) .... Take one half by mouth daily 4)  Calcium Lactate 750 Mg Tabs (Calcium lactate) .... Take two by mouth daily 5)  Tetracycline Hcl 500 Mg Caps (Tetracycline hcl) .... Take one by mouth daily 6)  Glucotrol Xl 5 Mg Tb24 (Glipizide) .... Take one by mouth daily 7)  Vitamin B-12 Cr 1000 Mcg Tbcr (Cyanocobalamin) .... One injection monthly 8)  Vesicare 10 Mg Tabs (Solifenacin succinate) .... Take one by mouth daily 9)  Anusol-hc 25 Mg Supp (Hydrocortisone acetate) .Marland Kitchen.. 1 pr at bedtime for 7-14 days   Patient Instructions: 1)  we will set up x rays of your knee and back when you check out 2)  use heat on low back for pain and stiffness 3)  use anusol hc suppository each bedtime for 1-2 weeks and use stool softener like colace over the counter if needed to keep from straining 4)  if bleeding does not resolve in 1-2 weeks- call for [place] to set up a GI appointment    Prescriptions: ANUSOL-HC 25 MG  SUPP (HYDROCORTISONE ACETATE) 1 pr at bedtime for 7-14 days  #14 x 1   Entered and Authorized by:   [name] MD   Signed by:   [name] MD on 06/16/2007   Method used:   Print then Give to Patient   RxID:   [id number]  ]  Medication Administration  Injection # 1:    Medication: Vit B12 1000 mcg    Diagnosis: B12 DEFICIENCY (ICD-266.2)    Route: IM    Site: L deltoid    Exp Date: 01/2009    Lot #: [id number]    Mfr: american regent    Patient tolerated injection without complications    Given by: Marty Heck (June 16, 2007 9:45 AM)  Orders Added: 1)  Diagnostic X-Ray/Fluoroscopy [Diagnostic X-Ray/Flu] 2)  Anoscopy [46600] 3)  Admin of Therapeutic Inj  intramuscular or subcutaneous [90772] 4)  Vit B12 1000 mcg [J3420] 5)  Est. Patient Level IV [id number]

## 2010-09-19 NOTE — Assessment & Plan Note (Signed)
Summary: DISCHARGE/CLE  Medications Added DIFLUCAN 150 MG  TABS (FLUCONAZOLE) 1 by mouth times one as needed yeast infection        Vital Signs:  Patient Profile:   77 Years Old Female Weight:      203 pounds Temp:     97.8 degrees F oral Pulse rate:   68 / minute Pulse rhythm:   regular BP sitting:   140 / 72  (left arm) Cuff size:   large  Vitals Entered By: Marty Heck (October 03, 2007 8:29 AM)                 Chief Complaint:  Vaginal discharge- yellow and itchy.  History of Present Illness: husband is in a nursing home for rehab- hopefully will get to come home - is very weak is really stressed out  some vaginal discharge about 11/2 weeks may be a yeast infection, with itching no pain  no otc meds  no bleeding  no kidney or urination probs has not been on antibiotics - but has been getting over a cold     Social History:    husband is ill    Review of Systems      See HPI  General      Complains of fatigue.      Denies chills and fever.  Eyes      Denies eye irritation.  ENT      Complains of postnasal drainage.  GI      Denies abdominal pain and change in bowel habits.  Derm      Denies rash.  Psych      stressed and worried with husb in nursing home   Physical Exam  General:     overweight but generally well appearing  Head:     normocephalic and atraumatic.   Neck:     No deformities, masses, or tenderness noted. Lungs:     Normal respiratory effort, chest expands symmetrically. Lungs are clear to auscultation, no crackles or wheezes. Heart:     Normal rate and regular rhythm. S1 and S2 normal without gallop, murmur, click, rub or other extra sounds. Abdomen:     no suprapubic tenderness or fullness Genitalia:     nl ext genetalia and vag mucosa, scant white d/c Skin:     Intact without suspicious lesions or rashes Inguinal Nodes:     No significant adenopathy Psych:     nl affect    Impression &  Recommendations:  Problem # 1:  VAGINITIS, CANDIDAL (ICD-112.1) will tx with diflucan and update if not better in a week adv to try daily yogurt Her updated medication list for this problem includes:    Diflucan 150 Mg Tabs (Fluconazole) .Marland Kitchen... 1 by mouth times one as needed yeast infection  Orders: Wet Prep [id number])   Complete Medication List: 1)  Lopid 600 Mg Tabs (Gemfibrozil) .... Take one by mouth daily 2)  Mavik 2 Mg Tabs (Trandolapril) .... Take one by mouth daily 3)  Estrace 0.1 Mg/gm Crea (Estradiol) .... Take one half by mouth daily 4)  Calcium Lactate 750 Mg Tabs (Calcium lactate) .... Take two by mouth daily 5)  Tetracycline Hcl 500 Mg Caps (Tetracycline hcl) .... Take one by mouth daily 6)  Glucotrol Xl 5 Mg Tb24 (Glipizide) .... Take one by mouth daily 7)  Vitamin B-12 Cr 1000 Mcg Tbcr (Cyanocobalamin) .... One injection every 8 weeks 8)  Vesicare 10 Mg Tabs (Solifenacin succinate) .... Take one by  mouth daily 9)  Anusol-hc 25 Mg Supp (Hydrocortisone acetate) .Marland Kitchen.. 1 pr at bedtime for 7-14 days 10)  Vitamin D 1000 Iu  11)  Diflucan 150 Mg Tabs (Fluconazole) .Marland Kitchen.. 1 by mouth times one as needed yeast infection   Patient Instructions: 1)  take the diflucan pill for yeast infection as directed 2)  eat yogurt 3)  let me know if not improved in 5-7 days or if symptoms worsen    Prescriptions: DIFLUCAN 150 MG  TABS (FLUCONAZOLE) 1 by mouth times one as needed yeast infection  #1 x 1   Entered and Authorized by:   [name] MD   Signed by:   [name] MD on 10/03/2007   Method used:   Print then Give to Patient   RxID:   [id number]  ]    Laboratory Results    Wet [place]/KOH Source: vaginal WBC/hpf 5-10 Bacteria/hpf rare Clue cells/hpf none  Negative whiff Yeast/hpf moderate with buds KOH 1+ Trichomonas/hpf none

## 2010-09-19 NOTE — Assessment & Plan Note (Signed)
Summary: FOLLOW UP / LFW   Vital Signs:  Patient profile:   77 year old female Weight:      214.25 pounds (97.39 kg) BMI:     38.09 Temp:     97.7 degrees F (36.50 degrees C) oral Pulse rate:   72 / minute Pulse rhythm:   regular BP sitting:   100 / 80  (left arm) Cuff size:   large  Vitals Entered By: Jasmine December CMA (July 08, 2009 9:10 AM) CC: follow-up visit   History of Present Illness: here for f/u of shingles  last visit rash in L1 S2 distribution and much pain in L1 area  overall the rash is drying up - and did break out with  more  is still having some pain -- med takes off the edge   no fever  feels basically ok   on valtrex and pain medication   is having problems with itching all over  is worse after starting pain medicine   did re schedule -- appt with allergist   decreased appetite- has lost some wt     Allergies: 1)  ! Voltaren  Past History:  Past Medical History: Last updated: 06/22/2009 Diabetes mellitus, type II Hypertension Osteoarthritis Hyperlipidemia angioedema was poss from voltaren vitamin b12 deficiency 4/08 LVH and atrial enlargement by echo in past with nl EF degenerative disk dz osteopenia  pruritis    ortho -- Dr Tonita Cong GI - Dr Amedeo Plenty   Past Surgical History: Last updated: 12/20/2008 Cataract extraction Hysterectomy, BSO- fibroids LVH-  EF 60% Echo- bi-atrial enlargement, mild (05/1995) Colonoscopy- no polyps (1995),   divertic., ? polyp (06/1998)  , diverticulosis (08/2003) L4- L5 disk disease- epidural inj. 12/1998 Dexa- osteopenia (04/2002) , stable (10/20050 Knee surgery- arthroscope spinal decomression sx 8/09 (12/09) colonoscopy polyp/ adenomatous - re check 5 y  (flex sig done 4/10 was nl -- so rec 5 y f/u) 4/10 dexa stable osteopenia   Family History: Last updated: 03/02/2008 sister CAD, brain tumor with hemorrhage   Social History: Last updated: 03/02/2008 husband is ill- she cares for him non  smoker  no alcohol  works a lot outside in garden  Risk Factors: Smoking Status: never (11/27/2006)  Review of Systems General:  Complains of fatigue and loss of appetite; denies chills, fever, and malaise. Eyes:  Denies blurring, discharge, eye irritation, and eye pain. CV:  Denies chest pain or discomfort, lightheadness, and palpitations. Resp:  Denies cough and wheezing. GI:  Denies abdominal pain, change in bowel habits, and indigestion. MS:  Denies joint redness and joint swelling. Derm:  Complains of itching, lesion(s), and rash. Neuro:  Denies numbness, tingling, and weakness. Endo:  Denies excessive thirst and excessive urination.  Physical Exam  General:  overweight but generally well appearing  Head:  normocephalic, atraumatic, and no abnormalities observed.   no rash on face Eyes:  vision grossly intact, pupils equal, pupils round, and pupils reactive to light.   Mouth:  pharynx pink and moist.   Neck:  supple with full rom and no masses or thyromegally, no JVD or carotid bruit  Lungs:  Normal respiratory effort, chest expands symmetrically. Lungs are clear to auscultation, no crackles or wheezes. Heart:  Normal rate and regular rhythm. S1 and S2 normal without gallop, murmur, click, rub or other extra sounds. Extremities:  trace LE edema Neurologic:  sensation intact to light touch, gait normal, and DTRs symmetrical and normal.   Skin:  rash- dried vesicles now all the way  down inner L leg also areas on buttocks dry with one intact vesicle Cervical Nodes:  No lymphadenopathy noted Inguinal Nodes:  No significant adenopathy Psych:  nl affect   Impression & Recommendations:  Problem # 1:  SHINGLES (ICD-053.9) Assessment Improved overall - interval increase in rash territory- but now drying up and less painful will switch pain med to ultram due to itch (? pot allergy to vicodin)  sympt care disc finish valtrex and update if notimp  Problem # 2:  PRURITUS  (ICD-698.9) Assessment: Unchanged worse with vicodin- so will change that  disc sympt care f/u allergist on dec 5th as planned   Complete Medication List: 1)  Lopid 600 Mg Tabs (Gemfibrozil) .... Take one by mouth daily 2)  Mavik 4 Mg Tabs (Trandolapril) .Marland Kitchen.. 1 by mouth once daily 3)  Estrace 1 Mg Tabs (Estradiol) .... 1/2 tab by mouth once daily 4)  Calcium Lactate 750 Mg Tabs (Calcium lactate) .... Take two by mouth daily 5)  Tetracycline Hcl 500 Mg Caps (Tetracycline hcl) .... Take one by mouth daily 6)  Vitamin B-12 Cr 1000 Mcg Tbcr (Cyanocobalamin) .... One injection every 8 weeks 7)  Vesicare 10 Mg Tabs (Solifenacin succinate) .... Take one by mouth daily 8)  Vitamin D 1000 Iu  .... One daily 9)  Centrum Tabs (Multiple vitamins-minerals) .... Take 1 tablet by mouth once a day 10)  Lasix 20 Mg Tabs (Furosemide) .Marland Kitchen.. 1 by mouth each am 11)  Glucotrol Xl 10 Mg Xr24h-tab (Glipizide) .... Take 1 tablet by mouth once a day 12)  Triamcinolone Acetonide 0.025 % Lotn (Triamcinolone acetonide) .... Apply to affected area once daily as needed 13)  Benadryl 25 Mg Tabs (Diphenhydramine hcl) .... Otc as directed. 14)  Valtrex 1 Gm Tabs (Valacyclovir hcl) .Marland Kitchen.. 1 by mouth three times a day for 7 days for shingles 15)  Ultram 50 Mg Tabs (Tramadol hcl) .Marland Kitchen.. 1 by mouth up to three times a day as needed pain  Patient Instructions: 1)  stop the vicodin- since it may make you itch more 2)  try ultram (tramadol) for pain  3)  finish your valtrex  4)  update me if rash worsens or fever or other symptoms  5)  you can try aaveno oatmeal bath in tepid bath (not hot)  6)  please change dec 6th appt with Adriauna Campton- make it 2 weeks later Prescriptions: ULTRAM 50 MG TABS (TRAMADOL HCL) 1 by mouth up to three times a day as needed pain  #30 x 1   Entered and Authorized by:   [name] MD   Signed by:   [name] MD on 07/08/2009   Method used:   Print then Give to Patient   RxID:   [id number]

## 2010-09-19 NOTE — Miscellaneous (Signed)
Summary: mammogram screening  Clinical Lists Changes  Observations: Added new observation of MAMMO DUE: 03/2011 (03/22/2010 13:23) Added new observation of MAMMOGRAM: normal (03/16/2010 13:23)      Preventive Care Screening  Mammogram:    Date:  03/16/2010    Next Due:  03/2011    Results:  normal

## 2010-09-19 NOTE — Progress Notes (Signed)
Summary: tetracycline, lopid rx's  Phone Note Refill Request Message from:  [place] on August 31, 2008 11:42 AM  Refills Requested: Medication #1:  TETRACYCLINE HCL 500 MG  CAPS take one by mouth daily  Medication #2:  LOPID 600 MG  TABS take one by mouth daily Initial call taken by: [name],  August 31, 2008 11:42 AM  Follow-up for Phone Call        px written on EMR for call in  Follow-up by: [name] MD,  August 31, 2008 1:25 PM  Additional Follow-up for Phone Call Additional follow up Details #1::        Rx faxed to pharmacy Additional Follow-up by: [name],  August 31, 2008 2:25 PM    New/Updated Medications: TETRACYCLINE HCL 500 MG  CAPS (TETRACYCLINE HCL) take one by mouth daily   Prescriptions: LOPID 600 MG  TABS (GEMFIBROZIL) take one by mouth daily  #90 x 1   Entered and Authorized by:   [name] MD   Signed by:   [name] on 08/31/2008   Method used:   Telephoned to ...         RxIDEA:[id number] TETRACYCLINE HCL 500 MG  CAPS (TETRACYCLINE HCL) take one by mouth daily  #30 x 5   Entered and Authorized by:   [name] MD   Signed by:   [name] on 08/31/2008   Method used:   Telephoned to ...         [id number]

## 2010-09-19 NOTE — Assessment & Plan Note (Signed)
Summary: Renee CONE  D/C 06/21/10/[facility]   Vital Signs:  Patient profile:   77 year old [name] Height:      63 inches Weight:      207.4 pounds BMI:     36.87 Temp:     97.9 degrees F oral Pulse rate:   80 / minute Pulse rhythm:   regular BP sitting:   120 / 78  (left arm) Cuff size:   large  Vitals Entered By: Zenda Alpers CMA Deborra Medina) (July 05, 2010 3:09 PM)  History of Present Illness: Chief complaint hospital follow up  pt had L subcortical cva with L hemiparesis and dysarthia  thought to be second to small vessel dz (neg mra and neg echo nad only 40-50% stenosis in L ICA  put on asa and plavix as part of point trial under care of Dr Leonie Man-- will see him the 16th of december   had inpt rehab stay and did well   sugars well controlled with glipizide and lantus  (note in past glipizide was not effetive) AIC was 6.4  zoloft added for emot lability- that is helping some    wt os dpwm 6 .4  bp is good at 120/8  renal insuf cr was 1.1 at d/c  still weak in arm and had and balance is not good  speech is much improved  is at home  is doing outpt PT at [facility] as well 2 d per week  can walk some by herself - better with can does better with cane than walker   sugars in good control  did get low only once in hospital -- did sliding scale   will be putting husband in living center in [place] for depression     Allergies: 1)  ! Voltaren 2)  ! * Actos  Past History:  Past Medical History: Diabetes mellitus, type II Hypertension Osteoarthritis Hyperlipidemia angioedema was poss from voltaren vitamin b12 deficiency 4/08 LVH and atrial enlargement by echo in past with nl EF degenerative disk dz osteopenia  pruritis  renal insufficiency CVA small vessel- sobcortical (in Point trial ) with Dr Leonie Man  ortho -- Dr Tonita Cong GI - Dr Amedeo Plenty   Review of Systems General:  Complains of fatigue; denies chills, fever, loss of appetite, and malaise. Eyes:  Denies  blurring and eye irritation. CV:  Denies chest pain or discomfort, palpitations, and shortness of breath with exertion. Resp:  Denies chest discomfort and cough. GI:  Denies abdominal pain, change in bowel habits, and indigestion. GU:  Complains of incontinence; denies dysuria and urinary frequency. MS:  Complains of loss of strength; denies muscle aches and cramps. Derm:  Denies itching, lesion(s), poor wound healing, and rash. Neuro:  Denies numbness and tingling. Psych:  Complains of depression; denies anxiety; zoloft is helping mood change from stroke. Endo:  Denies excessive thirst and excessive urination. Heme:  Denies abnormal bruising and bleeding.  Physical Exam  General:  overweight but generally well appearing  Head:  mild facial droop R normocephalic and atraumatic.   Eyes:  vision grossly intact, pupils equal, pupils round, and pupils reactive to light.   Mouth:  pharynx pink and moist.   Neck:  supple with full rom and no masses or thyromegally, no JVD or carotid bruit  Chest Wall:  No deformities, masses, or tenderness noted. Lungs:  Normal respiratory effort, chest expands symmetrically. Lungs are clear to auscultation, no crackles or wheezes. Heart:  Normal rate and regular rhythm. S1 and S2 normal  without gallop, murmur, click, rub or other extra sounds. Abdomen:  Bowel sounds positive,abdomen soft and non-tender without masses, organomegaly or hernias noted. no renal bruits  Msk:  No deformity or scoliosis noted of thoracic or lumbar spine.  no acute joint changes  Pulses:  R and L carotid,radial,femoral,dorsalis pedis and posterior tibial pulses are full and equal bilaterally Extremities:  No clubbing, cyanosis, edema, or deformity noted with normal full range of motion of all joints.   Neurologic:  weak L arm and leg -fairly mild  grip is 3-4/5 today Skin:  Intact without suspicious lesions or rashes Cervical Nodes:  No lymphadenopathy noted Psych:  normal affect,  talkative and pleasant   Diabetes Management Exam:    Foot Exam (with socks and/or shoes not present):       Sensory-Pinprick/Light touch:          Left medial foot (L-4): normal          Left dorsal foot (L-5): normal          Left lateral foot (S-1): normal          Right medial foot (L-4): normal          Right dorsal foot (L-5): normal          Right lateral foot (S-1): normal       Sensory-Monofilament:          Left foot: normal          Right foot: normal       Inspection:          Left foot: normal          Right foot: normal       Nails:          Left foot: normal          Right foot: normal   Impression & Recommendations:  Problem # 1:  CVA WITH LEFT HEMIPARESIS (ICD-438.20) Assessment New much improvement with rehab thought to be small vessel cva - no embolic source found  walking with cane Renee with Dr Leonie Man and in point trial with plavix and asa -- doing well   Her updated medication list for this problem includes:    Aspirin 325 Mg Tabs (Aspirin) ..... One tablet daily  Problem # 2:  RENAL INSUFFICIENCY (ICD-588.9) Assessment: Unchanged imp with 1.1 cr after hospital  disc avoiding renotoxic meds and keep up water intake  Problem # 3:  Hx of URINARY INCONTINENCE, MIXED (ICD-788.33) wants to try oxybutinin for cost instead of vesicare fax px to right source disc poss side eff incl dry mouth and constipation   Problem # 4:  DIABETES MELLITUS, TYPE II (ICD-250.00) imp with low dose lantus and back on glipizide disc dm diet  will watch for hypoglycemia  lab and Renee in jan  Her updated medication list for this problem includes:    Mavik 4 Mg Tabs (Trandolapril) .Marland Kitchen... 1 by mouth once daily    Glucotrol Xl 10 Mg Xr24h-tab (Glipizide) .Marland Kitchen... Take 1 tablet by mouth once a day    Lantus Solostar 100 Unit/ml Soln (Insulin glargine) ..... Inject 10 units once a day or as directed    Glucotrol 10 Mg Tabs (Glipizide) ..... One tablet daily    Aspirin 325 Mg Tabs  (Aspirin) ..... One tablet daily  Problem # 5:  Hx of ROSACEA (ICD-695.3) Assessment: Improved stopping tetracycline to see how she does   Complete Medication List: 1)  Lopid 600 Mg Tabs (  Gemfibrozil) .... Take one by mouth daily 2)  Mavik 4 Mg Tabs (Trandolapril) .Marland Kitchen.. 1 by mouth once daily 3)  Vitamin B-12 Cr 1000 Mcg Tbcr (Cyanocobalamin) .... One injection every 8 weeks 4)  Vitamin D 1000 Iu  .... One daily 5)  Centrum Tabs (Multiple vitamins-minerals) .... Take 1 tablet by mouth once a day 6)  Glucotrol Xl 10 Mg Xr24h-tab (Glipizide) .... Take 1 tablet by mouth once a day 7)  Amlodipine Besylate 10 Mg Tabs (Amlodipine besylate) .... Take 1 tablet by mouth once a day 8)  Hydralazine Hcl 25 Mg Tabs (Hydralazine hcl) .... Take one tablet by mouth three times a day 9)  Lantus Solostar 100 Unit/ml Soln (Insulin glargine) .... Inject 10 units once a day or as directed 10)  Pin Needles For Lantus Solostar  .... To inject once daily as directed 11)  Metoprolol Succinate 25 Mg Xr24h-tab (Metoprolol succinate) .... 1/2 tablet twice daily 12)  Oscal 500/200 D-3 500-200 Mg-unit Tabs (Calcium carbonate-vitamin d) .... One tablet daily 13)  Zoloft 50 Mg Tabs (Sertraline hcl) .... Once daily 14)  Glucotrol 10 Mg Tabs (Glipizide) .... One tablet daily 15)  Aspirin 325 Mg Tabs (Aspirin) .... One tablet daily 16)  Alprazolam 0.25 Mg Tabs (Alprazolam) .... One tablet every 8 hours as needed 17)  Oxybutynin Chloride 10 Mg Xr24h-tab (Oxybutynin chloride) .Marland Kitchen.. 1 by mouth once daily   Patient Instructions: 1)  change from vesicare to oxybutinin and see how you do  2)  stop the tetracycline (for face break out )  3)  follow up in january as planned  4)  if sugars get too low -- under 70- please make me aware  5)  you are doing great -- keep up the good work Prescriptions: OXYBUTYNIN CHLORIDE 10 MG XR24H-TAB (OXYBUTYNIN CHLORIDE) 1 by mouth once daily  #90 x 3   Entered and Authorized by:   [name] MD   Signed by:   [name] MD on 07/05/2010   Method used:   Print then Give to Patient   RxID:   [id number]    Orders Added: 1)  Est. Patient Level IV [id number]    Current Allergies (reviewed today): ! VOLTAREN ! * ACTOS

## 2010-09-19 NOTE — Assessment & Plan Note (Signed)
Summary: 3 MO. F/U/BIR   Vital Signs:  Patient profile:   77 year old female Height:      63 inches Weight:      220 pounds BMI:     39.11 Temp:     97.4 degrees F oral Pulse rate:   72 / minute Pulse rhythm:   regular BP sitting:   126 / 84  (left arm) Cuff size:   large  Vitals Entered By: [name] CMA (April 11, 2009 8:38 AM)  History of Present Illness: was here last week for flank/L back pain with urinary symptoms  was started on cipro the cx came back with mult organisms/ none specific   pain is still there but much improved from what it was  no side effects from cipro is getting enough water intake   she is back on glucotrol x. 10 at this time  sugars over weekend were up  this am was 164 , yest 150s- some 140s  diet has been good overall   is having a lot of itching on her legs - driving her crazy no rash  hard not to scratch  no exp to poison ivy  has been like this since legs swelling    Allergies: 1)  ! Voltaren  Past History:  Past Medical History: Last updated: 12/20/2008 Diabetes mellitus, type II Hypertension Osteoarthritis Hyperlipidemia angioedema was poss from voltaren vitamin b12 deficiency 4/08 LVH and atrial enlargement by echo in past with nl EF degenerative disk dz osteopenia    ortho -- Dr Tonita Cong GI - Dr Amedeo Plenty   Past Surgical History: Last updated: 12/20/2008 Cataract extraction Hysterectomy, BSO- fibroids LVH-  EF 60% Echo- bi-atrial enlargement, mild (05/1995) Colonoscopy- no polyps (1995),   divertic., ? polyp (06/1998)  , diverticulosis (08/2003) L4- L5 disk disease- epidural inj. 12/1998 Dexa- osteopenia (04/2002) , stable (10/20050 Knee surgery- arthroscope spinal decomression sx 8/09 (12/09) colonoscopy polyp/ adenomatous - re check 5 y  (flex sig done 4/10 was nl -- so rec 5 y f/u) 4/10 dexa stable osteopenia   Family History: Last updated: 03/02/2008 sister CAD, brain tumor with hemorrhage   Social  History: Last updated: 03/02/2008 husband is ill- she cares for him non smoker  no alcohol  works a lot outside in garden  Risk Factors: Smoking Status: never (11/27/2006)  Review of Systems General:  Complains of fatigue; denies chills, fever, loss of appetite, and malaise. Eyes:  Denies blurring. CV:  Denies chest pain or discomfort, lightheadness, and palpitations. Resp:  Denies cough and wheezing. GI:  Denies abdominal pain, change in bowel habits, and indigestion. GU:  Complains of urinary frequency; denies discharge, dysuria, hematuria, and urinary hesitancy. MS:  Complains of low back pain; denies joint pain. Derm:  Complains of itching; denies lesion(s), poor wound healing, and rash. Neuro:  Denies numbness and tingling. Endo:  Denies excessive thirst, excessive urination, and heat intolerance.  Physical Exam  General:  overweight but generally well appearing  Head:  normocephalic, atraumatic, and no abnormalities observed.   Eyes:  vision grossly intact, pupils equal, pupils round, and pupils reactive to light.   Mouth:  pharynx pink and moist.   Neck:  supple with full rom and no masses or thyromegally, no JVD or carotid bruit  Chest Wall:  No deformities, masses, or tenderness noted. Lungs:  Normal respiratory effort, chest expands symmetrically. Lungs are clear to auscultation, no crackles or wheezes. Heart:  Normal rate and regular rhythm. S1 and S2 normal without  gallop, murmur, click, rub or other extra sounds. Abdomen:  slt suprapubic tenderness  soft, normal bowel sounds, no distention, no masses, no guarding, no hepatomegaly, and no splenomegaly.   Msk:  L cva tenderness - mild/improved  no spinal tenderness nl rom LS / TS  Pulses:  R and L carotid,radial,femoral,dorsalis pedis and posterior tibial pulses are full and equal bilaterally Extremities:  trace edema bilat  no rash /excoriations  few varicosities  Neurologic:  sensation intact to light touch,  gait normal, and DTRs symmetrical and normal.   Skin:  Intact without suspicious lesions or rashes Cervical Nodes:  No lymphadenopathy noted Inguinal Nodes:  No significant adenopathy Psych:  normal affect, talkative and pleasant   Diabetes Management Exam:    Foot Exam (with socks and/or shoes not present):       Sensory-Pinprick/Light touch:          Left medial foot (L-4): normal          Left dorsal foot (L-5): normal          Left lateral foot (S-1): normal          Right medial foot (L-4): normal          Right dorsal foot (L-5): normal          Right lateral foot (S-1): normal       Sensory-Monofilament:          Left foot: normal          Right foot: normal       Inspection:          Left foot: normal          Right foot: normal       Nails:          Left foot: normal          Right foot: normal   Impression & Recommendations:  Problem # 1:  UTI (ICD-599.0) Assessment Improved  with flank pain that is improving on cipro  will repeat urine cx today clean catch due to mult bact seen on prev cx than update consider imaging if not further imp Her updated medication list for this problem includes:    Tetracycline Hcl 500 Mg Caps (Tetracycline hcl) .Marland Kitchen... Take one by mouth daily    Vesicare 10 Mg Tabs (Solifenacin succinate) .Marland Kitchen... Take one by mouth daily    Cipro 250 Mg Tabs (Ciprofloxacin hcl) .Marland Kitchen... 1 by mouth two times a day for 7 days  Orders: Specimen Handling (99000) T-Culture, Urine [id number])  Problem # 2:  FLANK PAIN, LEFT (ICD-789.09) Assessment: Improved see above- imp with tx of uti   Problem # 3:  PRURITUS (ICD-698.9) Assessment: Deteriorated on legs with edema and venous insuff suspect venous stasis dermatitis (mild)  will tx with triamcinolone lotion .025% and update  elevate legs - inst   Problem # 4:  RENAL INSUFFICIENCY (ICD-588.9) Assessment: Unchanged watching closely on lasix (also renal dosed cipro) next check 2 mo  will continue to  avoid metformin  Problem # 5:  EDEMA (ICD-782.3) Assessment: Improved overall imp off actos and on lasix will watch renal fxn disc lifestyle change  Her updated medication list for this problem includes:    Lasix 20 Mg Tabs (Furosemide) .Marland Kitchen... 1 by mouth each am  Problem # 6:  DIABETES MELLITUS, TYPE II (ICD-250.00) Assessment: Unchanged some inc in sugars- likely due to inf will continue on glucotrol (watch for hypoglycemia) no actos due to edema no metformin  due to renal insuff labs/f/u 2 mo  may need to add agent if no further imp  Her updated medication list for this problem includes:    Mavik 4 Mg Tabs (Trandolapril) .Marland Kitchen... 1 by mouth once daily    Glucotrol Xl 10 Mg Xr24h-tab (Glipizide) .Marland Kitchen... Take 1 tablet by mouth once a day  Complete Medication List: 1)  Lopid 600 Mg Tabs (Gemfibrozil) .... Take one by mouth daily 2)  Mavik 4 Mg Tabs (Trandolapril) .Marland Kitchen.. 1 by mouth once daily 3)  Estrace 1 Mg Tabs (Estradiol) .... 1/2 tab by mouth once daily 4)  Calcium Lactate 750 Mg Tabs (Calcium lactate) .... Take two by mouth daily 5)  Tetracycline Hcl 500 Mg Caps (Tetracycline hcl) .... Take one by mouth daily 6)  Vitamin B-12 Cr 1000 Mcg Tbcr (Cyanocobalamin) .... One injection every 8 weeks 7)  Vesicare 10 Mg Tabs (Solifenacin succinate) .... Take one by mouth daily 8)  Vitamin D 1000 Iu  9)  Centrum Tabs (Multiple vitamins-minerals) .... Take 1 tablet by mouth once a day 10)  Lasix 20 Mg Tabs (Furosemide) .Marland Kitchen.. 1 by mouth each am 11)  Glucotrol Xl 10 Mg Xr24h-tab (Glipizide) .... Take 1 tablet by mouth once a day 12)  Cipro 250 Mg Tabs (Ciprofloxacin hcl) .Marland Kitchen.. 1 by mouth two times a day for 7 days 13)  Triamcinolone Acetonide 0.025 % Lotn (Triamcinolone acetonide) .... Apply to affected area once daily  Patient Instructions: 1)  finish the cipro 2)  I am sending another urine culture today 3)  if symptoms do not improve further please let me know  4)  drink lots of water  5)   schedule labs in 2 months renal /AIC/ lipids/ast/alt 250.0, 401.1 and then follow up  Prescriptions: ESTRACE 1 MG TABS (ESTRADIOL) 1/2 tab by mouth once daily  #45 x 0   Entered and Authorized by:   [name] MD   Signed by:   [name] MD on 04/11/2009   Method used:   Printed then faxed to ...       [facility] (retail)       [address]       [place], [place]  [address]       Ph: [phone]       Fax: [phone]   RxID:   [id number] TRIAMCINOLONE ACETONIDE 0.025 % LOTN (TRIAMCINOLONE ACETONIDE) apply to affected area once daily  #1 mediium x 1   Entered and Authorized by:   [name] MD   Signed by:   [name] MD on 04/11/2009   Method used:   Print then Give to Patient   RxID:   [id number] ESTRACE 1 MG TABS (ESTRADIOL) 1/2 tab by mouth once daily  #90 x 3   Entered and Authorized by:   [name] MD   Signed by:   [name] MD on 04/11/2009   Method used:   Printed then faxed to ...       [facility] (retail)       [address]       [place], [place]  [address]       Ph: [phone]       Fax: [phone]   RxID:   [id number] GLUCOTROL XL 10 MG XR24H-TAB (GLIPIZIDE) Take 1 tablet by mouth once a day  #90 x 3   Entered and Authorized by:   [name] MD   Signed by:   [employer]  Nilsa Nutting MD on 04/11/2009   Method used:   Printed then faxed to ...       [facility] (retail)       [address]       [place], [place]  [address]       Ph: [phone]       Fax: [phone]   RxID:   [id number] LASIX 20 MG TABS (FUROSEMIDE) 1 by mouth each am  #90 x 3   Entered and Authorized by:   [name] MD   Signed by:   [name] MD on 04/11/2009   Method used:   Printed then faxed to ...       [facility] (retail)       [address]       [place], [place]  [address]       Ph: [phone]       Fax: [phone]   RxID:   [id number] VESICARE 10 MG  TABS (SOLIFENACIN  SUCCINATE) take one by mouth daily  #90 x 3   Entered and Authorized by:   [name] MD   Signed by:   [name] MD on 04/11/2009   Method used:   Printed then faxed to ...       [facility] (retail)       [address]       [place], [place]  [address]       Ph: [phone]       Fax: [phone]   RxID:   [id number] TETRACYCLINE HCL 500 MG  CAPS (TETRACYCLINE HCL) take one by mouth daily  #90 x 3   Entered and Authorized by:   [name] MD   Signed by:   [name] MD on 04/11/2009   Method used:   Printed then faxed to ...       [facility] (retail)       [address]       [place], [place]  [address]       Ph: [phone]       Fax: [phone]   RxID:   [id number] [place] 4 MG TABS (TRANDOLAPRIL) 1 by mouth once daily  #90 x 3   Entered and Authorized by:   [name] MD   Signed by:   [name] MD on 04/11/2009   Method used:   Printed then faxed to ...       [facility] (retail)       [address]       [place], [place]  [address]       Ph: [phone]       Fax: [phone]   RxID:   [id number] LOPID 600 MG  TABS (GEMFIBROZIL) take one by mouth daily  #90 x 3   Entered and Authorized by:   [name] MD   Signed by:   [name] MD on 04/11/2009   Method used:   Printed then faxed to ...       [facility] (retail)       [address]       [place], [place]  [address]       Ph: [phone]       Fax: [phone]   RxID:   [id number]   Prior Medications (reviewed today): LOPID 600 MG  TABS (GEMFIBROZIL) take one by mouth daily MAVIK 4 MG TABS (TRANDOLAPRIL) 1 by mouth once daily ESTRACE 1 MG TABS (ESTRADIOL)  1/2 tab by mouth once daily CALCIUM LACTATE 750 MG  TABS (CALCIUM LACTATE) take two by mouth daily TETRACYCLINE HCL 500 MG  CAPS (TETRACYCLINE HCL) take one by mouth daily VITAMIN B-12 CR 1000 MCG  TBCR (CYANOCOBALAMIN) one injection every 8 weeks VESICARE 10 MG  TABS  (SOLIFENACIN SUCCINATE) take one by mouth daily VITAMIN D 1000 IU ()  CENTRUM  TABS (MULTIPLE VITAMINS-MINERALS) Take 1 tablet by mouth once a day LASIX 20 MG TABS (FUROSEMIDE) 1 by mouth each am GLUCOTROL XL 10 MG XR24H-TAB (GLIPIZIDE) Take 1 tablet by mouth once a day CIPRO 250 MG TABS (CIPROFLOXACIN HCL) 1 by mouth two times a day for 7 days TRIAMCINOLONE ACETONIDE 0.025 % LOTN (TRIAMCINOLONE ACETONIDE) apply to affected area once daily Current Allergies (reviewed today): ! VOLTAREN Current Medications (including changes made in today's visit):  LOPID 600 MG  TABS (GEMFIBROZIL) take one by mouth daily MAVIK 4 MG TABS (TRANDOLAPRIL) 1 by mouth once daily ESTRACE 1 MG TABS (ESTRADIOL) 1/2 tab by mouth once daily CALCIUM LACTATE 750 MG  TABS (CALCIUM LACTATE) take two by mouth daily TETRACYCLINE HCL 500 MG  CAPS (TETRACYCLINE HCL) take one by mouth daily VITAMIN B-12 CR 1000 MCG  TBCR (CYANOCOBALAMIN) one injection every 8 weeks VESICARE 10 MG  TABS (SOLIFENACIN SUCCINATE) take one by mouth daily * VITAMIN D 1000 IU  CENTRUM  TABS (MULTIPLE VITAMINS-MINERALS) Take 1 tablet by mouth once a day LASIX 20 MG TABS (FUROSEMIDE) 1 by mouth each am GLUCOTROL XL 10 MG XR24H-TAB (GLIPIZIDE) Take 1 tablet by mouth once a day CIPRO 250 MG TABS (CIPROFLOXACIN HCL) 1 by mouth two times a day for 7 days TRIAMCINOLONE ACETONIDE 0.025 % LOTN (TRIAMCINOLONE ACETONIDE) apply to affected area once daily

## 2010-09-19 NOTE — Assessment & Plan Note (Signed)
Summary: 3 MONTH  Medications Added LOPID 600 MG  TABS (GEMFIBROZIL) take one by mouth daily MAVIK 2 MG  TABS (TRANDOLAPRIL) take one by mouth daily ESTRACE 0.1 MG/GM  CREA (ESTRADIOL) take one half by mouth daily CALCIUM LACTATE 750 MG  TABS (CALCIUM LACTATE) take two by mouth daily TETRACYCLINE HCL 500 MG  CAPS (TETRACYCLINE HCL) take one by mouth daily GLUCOTROL XL 5 MG  TB24 (GLIPIZIDE) take one by mouth daily VITAMIN B-12 CR 1000 MCG  TBCR (CYANOCOBALAMIN) one injection monthly VESICARE 10 MG  TABS (SOLIFENACIN SUCCINATE) take one by mouth daily        Vital Signs:  Patient Profile:   77 Years Old Female Weight:      208 pounds Temp:     97.4 degrees F oral Pulse rate:   68 / minute Pulse rhythm:   regular BP sitting:   150 / 80  (left arm) Cuff size:   large  Vitals Entered By: Marty Heck (March 05, 2007 9:05 AM)               Chief Complaint:  3 month follow up.  History of Present Illness: at last visit, renal fxn was stable B12 was low- which promted to start B12 shots- last shot was 5/23, so needs shot-thinks she has more energy DM was improved as well has been doing allright- bp is high today sugars at home (this am was 128)- has been running 120s-130s, once at lunch was 123 been feeling good overall feels like she has lost about 9 lb- learning how to cook healthy, more veg and less salt more exercise working in the garden  R shoulder has been bothering her for about a month hurts to reach back and fasten bra has been using rare advil, working a lot in garden not swollen or red, no popping,no weakness  husb was in the hosp with CHF- both are watching diet now   Serial Vital Signs/Assessments:  Time      Position  BP       Pulse  Resp  Temp     By                     128/80                         [name] MD       Review of Systems  General      Complains of fatigue.      Denies loss of appetite.      fatigue is getting  better  Eyes      Denies blurring.  CV      Denies chest pain or discomfort.  GU      Denies urinary frequency.  MS      Complains of joint pain and stiffness.      Denies joint redness, joint swelling, and loss of strength.  Derm      See HPI      Denies rash.  Neuro      Denies numbness.  Psych      mood is ok  Endo      Denies excessive urination.      has been a little more thirsty working outdoors   Physical Exam  General:     overwt but well appearing Mouth:     MMM Neck:     No deformities, masses, or tenderness noted.no thyromegaly, no JVD,  and no carotid bruits.   Chest Wall:     No deformities, masses, or tenderness noted. Lungs:     Normal respiratory effort, chest expands symmetrically. Lungs are clear to auscultation, no crackles or wheezes. Heart:     Normal rate and regular rhythm. S1 and S2 normal without gallop, murmur, click, rub or other extra sounds. Msk:     R shoulder hurts to abduct over 90 degrees, with mild acromial tenderness and pos hawking's test for pain some discomfort with int rotation, but can do it no swelling, redness, or heat  Pulses:     R and L carotid,radial,femoral,dorsalis pedis and posterior tibial pulses are full and equal bilaterally Extremities:     No clubbing, cyanosis, edema, or deformity noted with normal full range of motion of all joints.   Neurologic:     nl grip/st/sent of UEs (symmetrically) Skin:     turgor normal, color normal, and no rashes.   Cervical Nodes:     No lymphadenopathy noted Psych:     nl affect, pleasant    Impression & Recommendations:  Problem # 1:  B12 DEFICIENCY (ICD-266.2) will do inj today- and continue on monthly schedule some clinical imp Orders: Vit B12 1000 mcg (J3420) Admin of Therapeutic Inj  intramuscular or subcutaneous [id number])   Problem # 2:  HYPERLIPIDEMIA (ICD-272.4) keep up the great job with diet and exercise and wt loss Her updated medication list for  this problem includes:    Lopid 600 Mg Tabs (Gemfibrozil) .Marland Kitchen... Take one by mouth daily  Orders: Venipuncture [id number]) TLB-Lipid Panel (80061-LIPID) TLB-ALT (SGPT) (84460-ALT) TLB-AST (SGOT) (84450-SGOT)   Problem # 3:  HYPERTENSION (ICD-401.9) keep up the great lifestyle change is stable with no change to med will remain off diuretic and continue watching salt Her updated medication list for this problem includes:    Mavik 2 Mg Tabs (Trandolapril) .Marland Kitchen... Take one by mouth daily  Orders: TLB-Creatinine, Blood (82565-CREA)   Problem # 4:  SHOULDER PAIN, RIGHT (ICD-719.41) suspect mild bursitis will tx with relative rest and rom gentle occ nsaid is ok with caution ice when able if no imp (or worse) will call for ortho ref  Problem # 5:  DIABETES MELLITUS, TYPE II (ICD-250.00) will check AIC today, expect further improvement Her updated medication list for this problem includes:    Mavik 2 Mg Tabs (Trandolapril) .Marland Kitchen... Take one by mouth daily    Glucotrol Xl 5 Mg Tb24 (Glipizide) .Marland Kitchen... Take one by mouth daily  Orders: Venipuncture [id number]) TLB-A1C / Hgb A1C (Glycohemoglobin) (83036-A1C)   Medications Added to Medication List This Visit: 1)  Lopid 600 Mg Tabs (Gemfibrozil) .... Take one by mouth daily 2)  Mavik 2 Mg Tabs (Trandolapril) .... Take one by mouth daily 3)  Estrace 0.1 Mg/gm Crea (Estradiol) .... Take one half by mouth daily 4)  Calcium Lactate 750 Mg Tabs (Calcium lactate) .... Take two by mouth daily 5)  Tetracycline Hcl 500 Mg Caps (Tetracycline hcl) .... Take one by mouth daily 6)  Glucotrol Xl 5 Mg Tb24 (Glipizide) .... Take one by mouth daily 7)  Vitamin B-12 Cr 1000 Mcg Tbcr (Cyanocobalamin) .... One injection monthly 8)  Vesicare 10 Mg Tabs (Solifenacin succinate) .... Take one by mouth daily        Prior Medications: LOPID 600 MG  TABS (GEMFIBROZIL) take one by mouth daily MAVIK 2 MG  TABS (TRANDOLAPRIL) take one by mouth daily ESTRACE 0.1 MG/GM   CREA (ESTRADIOL) take one half  by mouth daily CALCIUM LACTATE 750 MG  TABS (CALCIUM LACTATE) take two by mouth daily TETRACYCLINE HCL 500 MG  CAPS (TETRACYCLINE HCL) take one by mouth daily GLUCOTROL XL 5 MG  TB24 (GLIPIZIDE) take one by mouth daily VITAMIN B-12 CR 1000 MCG  TBCR (CYANOCOBALAMIN) one injection monthly VESICARE 10 MG  TABS (SOLIFENACIN SUCCINATE) take one by mouth daily     Medication Administration  Injection # 1:    Medication: Vit B12 1000 mcg    Diagnosis: B12 DEFICIENCY (ICD-266.2)    Route: IM    Site: L deltoid    Exp Date: 10/2008    Lot #: [id number]    Mfr: american regent    Patient tolerated injection without complications    Given by: Marty Heck (March 05, 2007 9:33 AM)  Orders Added: 1)  Venipuncture [id number] 2)  TLB-Lipid Panel [80061-LIPID] 3)  TLB-ALT (SGPT) [84460-ALT] 4)  TLB-AST (SGOT) [84450-SGOT] 5)  TLB-A1C / Hgb A1C (Glycohemoglobin) [83036-A1C] 6)  TLB-Creatinine, Blood [82565-CREA] 7)  Est. Patient Level IV [id number] 8)  Vit B12 1000 mcg [J3420] 9)  Admin of Therapeutic Inj  intramuscular or subcutaneous [90772]

## 2010-09-19 NOTE — Assessment & Plan Note (Signed)
Summary: left side pain/mk   Vital Signs:  Patient profile:   77 year old female Height:      63 inches Weight:      218 pounds BMI:     38.76 Temp:     97.6 degrees F oral Pulse rate:   72 / minute Pulse rhythm:   regular BP sitting:   130 / 78  (left arm) Cuff size:   regular  Vitals Entered By: [name] CMA (April 06, 2009 10:17 AM)  History of Present Illness: a lot of pain in L side back / side  started monday eve  did peel tomatos that day and lifted heavy pan- not unusual for her   this does not feel like her back problem is dull and throbbing and very uncomfortabe  no difference what pos she is in at all  cannot get comfortable to rest at night  is on the severe side - almost makes her nauseated- but not vomitng yet    burping more than usual  eating does not change pain no constipation or diarrhea  colonoscopy was in 12/09- with a polyp  no urnary symptoms  no burning or blood in urine  nl amt of frequency  no hx of kindey stones   took some advil - helped a bit at first- then stopped working  no heat or ice   sugars are starting to improve on glipizide 140 today  that is definitely improved   Allergies: 1)  ! Voltaren  Past History:  Past Medical History: Last updated: 12/20/2008 Diabetes mellitus, type II Hypertension Osteoarthritis Hyperlipidemia angioedema was poss from voltaren vitamin b12 deficiency 4/08 LVH and atrial enlargement by echo in past with nl EF degenerative disk dz osteopenia    ortho -- Dr Tonita Cong GI - Dr Amedeo Plenty   Past Surgical History: Last updated: 12/20/2008 Cataract extraction Hysterectomy, BSO- fibroids LVH-  EF 60% Echo- bi-atrial enlargement, mild (05/1995) Colonoscopy- no polyps (1995),   divertic., ? polyp (06/1998)  , diverticulosis (08/2003) L4- L5 disk disease- epidural inj. 12/1998 Dexa- osteopenia (04/2002) , stable (10/20050 Knee surgery- arthroscope spinal decomression sx 8/09 (12/09)  colonoscopy polyp/ adenomatous - re check 5 y  (flex sig done 4/10 was nl -- so rec 5 y f/u) 4/10 dexa stable osteopenia   Family History: Last updated: 03/02/2008 sister CAD, brain tumor with hemorrhage   Social History: Last updated: 03/02/2008 husband is ill- she cares for him non smoker  no alcohol  works a lot outside in garden  Risk Factors: Smoking Status: never (11/27/2006)  Review of Systems General:  Denies chills, fatigue, fever, loss of appetite, and malaise. Eyes:  Denies blurring. CV:  Denies chest pain or discomfort and palpitations. Resp:  Denies cough and wheezing. GI:  Denies bloody stools, change in bowel habits, indigestion, and nausea. GU:  Complains of urinary frequency; denies abnormal vaginal bleeding, discharge, dysuria, hematuria, and urinary hesitancy. Derm:  Complains of itching; denies lesion(s) and rash; legs itch when they swell. Neuro:  Denies numbness, tingling, and weakness.  Physical Exam  General:  overweight but generally well appearing  Head:  normocephalic, atraumatic, and no abnormalities observed.   Eyes:  vision grossly intact, pupils equal, pupils round, and pupils reactive to light.   Mouth:  MMM Neck:  No deformities, masses, or tenderness noted. Lungs:  Normal respiratory effort, chest expands symmetrically. Lungs are clear to auscultation, no crackles or wheezes. Heart:  Normal rate and regular rhythm. S1 and S2 normal  without gallop, murmur, click, rub or other extra sounds. Abdomen:  tender lat L abd and flank (not ribs)  no rebound or gaurding  soft, normal bowel sounds, no distention, no masses, no hepatomegaly, and no splenomegaly.   Msk:  L cva tenderness - mild no spinal tenderness nl rom LS / TS no reproduction of pain with change in position neg slr  Pulses:  R and L carotid,radial,femoral,dorsalis pedis and posterior tibial pulses are full and equal bilaterally Extremities:  No clubbing, cyanosis, edema, or  deformity noted with normal full range of motion of all joints.   Neurologic:  strength normal in all extremities, sensation intact to light touch, gait normal, and DTRs symmetrical and normal.   Skin:  Intact without suspicious lesions or rashes Cervical Nodes:  No lymphadenopathy noted Inguinal Nodes:  No significant adenopathy Psych:  normal affect, talkative and pleasant    Impression & Recommendations:  Problem # 1:  FLANK PAIN, LEFT (ICD-789.09) Assessment New  with slt abn ua , and non positional symptoms  will tx for uti with renal dose cipro - 250 keep up water intake  pend urine cx - if neg or not imp- will consider imaging   Orders: UA Dipstick W/ Micro (manual) (81000)  Problem # 2:  DIABETES MELLITUS, TYPE II (ICD-250.00) Assessment: Improved some imp with re - start of glipizide  will continue to monitor  DM diet recommended  Her updated medication list for this problem includes:    Mavik 4 Mg Tabs (Trandolapril) .Marland Kitchen... 1 by mouth once daily    Glucotrol Xl 10 Mg Xr24h-tab (Glipizide) .Marland Kitchen... Take 1 tablet by mouth once a day  Complete Medication List: 1)  Lopid 600 Mg Tabs (Gemfibrozil) .... Take one by mouth daily 2)  Mavik 4 Mg Tabs (Trandolapril) .Marland Kitchen.. 1 by mouth once daily 3)  Estrace 1 Mg Tabs (Estradiol) .... 1/2 tab by mouth once daily 4)  Calcium Lactate 750 Mg Tabs (Calcium lactate) .... Take two by mouth daily 5)  Tetracycline Hcl 500 Mg Caps (Tetracycline hcl) .... Take one by mouth daily 6)  Vitamin B-12 Cr 1000 Mcg Tbcr (Cyanocobalamin) .... One injection every 8 weeks 7)  Vesicare 10 Mg Tabs (Solifenacin succinate) .... Take one by mouth daily 8)  Vitamin D 1000 Iu  9)  Centrum Tabs (Multiple vitamins-minerals) .... Take 1 tablet by mouth once a day 10)  Lasix 20 Mg Tabs (Furosemide) .Marland Kitchen.. 1 by mouth each am 11)  Glucotrol Xl 10 Mg Xr24h-tab (Glipizide) .... Take 1 tablet by mouth once a day 12)  Cipro 250 Mg Tabs (Ciprofloxacin hcl) .Marland Kitchen.. 1 by mouth  two times a day for 7 days  Other Orders: T-Culture, Urine [id number])  Patient Instructions: 1)  drink lots of water  2)  take the cipro as directed  3)  continue the glipizide  4)  urine culture pending  5)  if symptoms worsen- pain or other new symptoms like fever -- please update me 6)  will make plan when culture returns  7)  keep monday appt - for now / may cancel it  Prescriptions: CIPRO 250 MG TABS (CIPROFLOXACIN HCL) 1 by mouth two times a day for 7 days  #14 x 0   Entered and Authorized by:   [name] MD   Signed by:   [name] MD on 04/06/2009   Method used:   Print then Give to Patient   RxID:   [id number]   Laboratory Results  Urine Tests  Date/Time Received: April 06, 2009 10:18 AM Date/Time Reported: April 06, 2009 10:18 AM  Routine Urinalysis   Color: yellow Appearance: Clear Glucose: negative   (Normal Range: Negative) Bilirubin: negative   (Normal Range: Negative) Ketone: negative   (Normal Range: Negative) Spec. Gravity: 1.025   (Normal Range: 1.003-1.035) Blood: negative   (Normal Range: Negative) pH: 6.0   (Normal Range: 5.0-8.0) Protein: negative   (Normal Range: Negative) Urobilinogen: 0.2   (Normal Range: 0-1) Nitrite: negative   (Normal Range: Negative) Leukocyte Esterace: negative   (Normal Range: Negative)  Urine Microscopic WBC/HPF: 3-4 RBC/HPF: 1 Bacteria/HPF: mild Mucous/HPF: moderate Epithelial/HPF: 3-4 Crystals/HPF: 0 Casts/LPF: 0 Yeast/HPF: 0 Other: 0

## 2010-09-19 NOTE — Letter (Signed)
Summary: [facility]   Imported By: Edmonia James 12/20/2009 12:26:58  _____________________________________________________________________  External Attachment:    Type:   Image     Comment:   External Document  Appended Document: [facility]    Clinical Lists Changes  Observations: Added new observation of DMEYEEXAMNXT: 12/2010 (12/25/2009 19:00) Added new observation of DMEYEEXMRES: diabetic retinopathy (12/08/2009 19:01) Added new observation of EYE EXAM BY: Dr Einar Gip (12/08/2009 19:01) Added new observation of DIAB EYE EX: diabetic retinopathy (12/08/2009 19:01)        Diabetes Management Exam:    Eye Exam:       Eye Exam done elsewhere          Date: 12/08/2009          Results: diabetic retinopathy          Done by: Dr Einar Gip

## 2010-09-19 NOTE — Medication Information (Signed)
Summary: [facility]Diabetic Supplies Order  [facility]Diabetic Supplies Order   Imported By: Virgia Land 04/07/2008 10:01:33  _____________________________________________________________________  External Attachment:    Type:   Image     Comment:   External Document

## 2010-09-19 NOTE — Progress Notes (Signed)
Summary: Need appt to have Urine check?  Phone Note Call from Patient Call back at Home Phone [phone]   Caller: Patient Summary of Call: Pt stated on message that you put her on antibiotic for 5 days for UTI and told her to have urine checked to see if she still had infection. She wants to know if she needs to have appt. to have urine checked or if she can just come in and have this done. Please call her. Initial call taken by: Genice Rouge,  May 11, 2008 9:16 AM  Follow-up for Phone Call        I am ok with her dropping off urine to send for a culture in 1 week-- do not think we generally need appt for that - but up to Eva (does not need to see me) Follow-up by: [name] MD,  May 11, 2008 10:26 AM  Additional Follow-up for Phone Call Additional follow up Details #1::        Left message on answering machine. ...............................................Marland KitchenRonny Bacon Brittainy Bucker May 11, 2008 12:12 PM

## 2010-09-19 NOTE — Progress Notes (Signed)
Summary: Update on legs swelling and blood sugar readings  Phone Note Call from Patient Call back at [phone]   Caller: Patient Call For: [name] MD Summary of Call: Patient say she was to call today to give Dr. Glori Bickers an update on swelling in feet, ankles and legs since stopping Actos on 03/02/09. Patient states feet, ankles and legs are still having some swelling but she says they are about 50% better than when she was here on 03/02/09. Patient also gave these blood sugar readings. 03/03/09 fasting 149; 03/04/09 fasting 149 and 2hr after eating 152; 03/05/09 fasting 106 and 2 hr after eating 160; 03/06/09 fasting 135; and 03/07/09 fasting 148. Please advise. Initial call taken by: Ozzie Hoyle LPN,  July 19, [id number] PM  Follow-up for Phone Call        thanks so much for the update - is helpful  stay off the actos-- I suspect swelling will continue to improve more over the next week (avoid salt - drink water) also- blood sugars are not as high as I suspected they would be  keep checking them this week-  I am not going to add any medicine yet  please update me 1 week (next monday) again with blood sugars and progress in terms of swelling- then I will make a further plan Follow-up by: [name] MD,  March 07, 2009 3:05 PM  Additional Follow-up for Phone Call Additional follow up Details #1::        Patient notified as instructed by telephone. Additional Follow-up by: Emelia Salisbury LPN,  July 19, [id number] PM

## 2010-09-19 NOTE — Letter (Signed)
Summary: [facility]  [facility]   Imported By: Edmonia James 12/01/2008 10:22:08  _____________________________________________________________________  External Attachment:    Type:   Image     Comment:   External Document  Appended Document: [facility]    Clinical Lists Changes  Observations: Added new observation of PAST SURG HX: Cataract extraction Hysterectomy, BSO- fibroids LVH-  EF 60% Echo- bi-atrial enlargement, mild (05/1995) Colonoscopy- no polyps (1995),   divertic., ? polyp (06/1998)  , diverticulosis (08/2003) L4- L5 disk disease- epidural inj. 12/1998 Dexa- osteopenia (04/2002) , stable (10/20050 Knee surgery- arthroscope spinal decomression sx 8/09 (12/09) colonoscopy polyp/ adenomatous - re check 5 y  (flex sig done 4/10 was nl -- so rec 5 y f/u)  (12/05/2008 17:47)       Preventive Care Screening     flex sig 11/23/2008 with Dr Amedeo Plenty - was nl, recommended 5 y f/u colonoscopy for hx of polyp    Past Surgical History:    Cataract extraction    Hysterectomy, BSO- fibroids    LVH-  EF 60%    Echo- bi-atrial enlargement, mild (05/1995)    Colonoscopy- no polyps (1995),   divertic., ? polyp (06/1998)  , diverticulosis (08/2003)    L4- L5 disk disease- epidural inj. 12/1998    Dexa- osteopenia (04/2002) , stable (10/20050    Knee surgery- arthroscope    spinal decomression sx 8/09    (12/09) colonoscopy polyp/ adenomatous - re check 5 y  (flex sig done 4/10 was nl -- so rec 5 y f/u)

## 2010-09-19 NOTE — Miscellaneous (Signed)
Summary: Orders Update-urine cx   Clinical Lists Changes  Orders: Added new Test order of T-Culture, Urine [id number]) - Signed

## 2010-09-19 NOTE — Miscellaneous (Signed)
Summary: Case Mgmt Form/[facility]  Case Mgmt Form/[facility]   Imported By: Edmonia James 06/30/2010 11:06:07  _____________________________________________________________________  External Attachment:    Type:   Image     Comment:   External Document

## 2010-09-19 NOTE — Assessment & Plan Note (Signed)
Summary: swollen in feet and ankles,legs/dlo   Vital Signs:  Patient profile:   77 year old female Height:      63 inches Weight:      228 pounds BMI:     40.53 Temp:     97.3 degrees F oral Pulse rate:   76 / minute Pulse rhythm:   regular BP sitting:   126 / 80  (left arm) Cuff size:   large  Vitals Entered By: [name] CMA (March 02, 2009 9:59 AM)  History of Present Illness: more swollen- much more than last visit  much worse for 3 weeks legs and feet are sore - hurts to move them and stand on them  no shortness or breath or chest pain no pnd or orthopnea  ? if due to actos inc to 30   sugars - has been under 120 for past 2 weeks  last 2 days up a bit - forgot med  AIC good at 6.4    wt is up 9 l bs  is really retaining fluid  cannot exercise due to swelling  diet has been fair -- worse than last time  really avoids salty foods    Allergies: 1)  ! Voltaren  Past History:  Past Medical History: Last updated: 12/20/2008 Diabetes mellitus, type II Hypertension Osteoarthritis Hyperlipidemia angioedema was poss from voltaren vitamin b12 deficiency 4/08 LVH and atrial enlargement by echo in past with nl EF degenerative disk dz osteopenia    ortho -- Dr Tonita Cong GI - Dr Amedeo Plenty   Past Surgical History: Last updated: 12/20/2008 Cataract extraction Hysterectomy, BSO- fibroids LVH-  EF 60% Echo- bi-atrial enlargement, mild (05/1995) Colonoscopy- no polyps (1995),   divertic., ? polyp (06/1998)  , diverticulosis (08/2003) L4- L5 disk disease- epidural inj. 12/1998 Dexa- osteopenia (04/2002) , stable (10/20050 Knee surgery- arthroscope spinal decomression sx 8/09 (12/09) colonoscopy polyp/ adenomatous - re check 5 y  (flex sig done 4/10 was nl -- so rec 5 y f/u) 4/10 dexa stable osteopenia   Family History: Last updated: 03/02/2008 sister CAD, brain tumor with hemorrhage   Social History: Last updated: 03/02/2008 husband is ill- she cares for  him non smoker  no alcohol  works a lot outside in garden  Risk Factors: Smoking Status: never (11/27/2006)  Review of Systems General:  Denies fatigue, fever, loss of appetite, and malaise. Eyes:  Denies blurring. CV:  Complains of swelling of feet; denies chest pain or discomfort, leg cramps with exertion, lightheadness, palpitations, and shortness of breath with exertion; no pnd or orthopnea. Resp:  Denies cough, shortness of breath, and wheezing. GI:  Denies abdominal pain, nausea, and vomiting. Derm:  Denies poor wound healing and rash. Neuro:  Denies numbness. Endo:  Denies excessive thirst and excessive urination.  Physical Exam  General:  overweight but generally well appearing  Head:  normocephalic, atraumatic, and no abnormalities observed.   Eyes:  vision grossly intact, pupils equal, pupils round, and pupils reactive to light.  no conjunctival pallor, injection or icterus  Mouth:  MMM, throat clear Neck:  supple with full rom and no masses or thyromegally, no JVD or carotid bruit  Lungs:  Normal respiratory effort, chest expands symmetrically. Lungs are clear to auscultation, no crackles or wheezes. Heart:  Normal rate and regular rhythm. S1 and S2 normal without gallop, murmur, click, rub or other extra sounds. Abdomen:  soft and non-tender.   Msk:  No deformity or scoliosis noted of thoracic or lumbar spine.  Pulses:  R and L carotid,radial,femoral,dorsalis pedis and posterior tibial pulses are full and equal bilaterally Extremities:  1+ left pedal edema and 1+ right pedal edema.   no palpable cords or tenderness  Neurologic:  sensation intact to light touch, gait normal, and DTRs symmetrical and normal.   Skin:  Intact without suspicious lesions or rashes Cervical Nodes:  No lymphadenopathy noted Inguinal Nodes:  No significant adenopathy Psych:  normal affect, talkative and pleasant   Diabetes Management Exam:    Foot Exam (with socks and/or shoes not  present):       Sensory-Pinprick/Light touch:          Left medial foot (L-4): normal          Left dorsal foot (L-5): normal          Left lateral foot (S-1): normal          Right medial foot (L-4): normal          Right dorsal foot (L-5): normal          Right lateral foot (S-1): normal       Sensory-Monofilament:          Left foot: normal          Right foot: normal       Inspection:          Left foot: normal          Right foot: normal       Nails:          Left foot: normal          Right foot: normal   Impression & Recommendations:  Problem # 1:  EDEMA (ICD-782.3) Assessment Deteriorated worsened in past 3 weeks suspect from actos/ heat/ salt in diet (ate' beanie- weanies) info given on low salt diet  elevate legs  update if any sob  stop actos -- and watch sugar carefully over weekend  update monday if imp - need to consider alt agent for DM  Problem # 2:  DIABETES MELLITUS, TYPE II (ICD-250.00) best control for a while- unfortunately have to stop actos for swelling stop it  update monday consider januvia (renal dose )  in past glucotrol not eff cannot have metformin due to renal insuff Her updated medication list for this problem includes:    Mavik 4 Mg Tabs (Trandolapril) .Marland Kitchen... 1 by mouth once daily    Actos 30 Mg Tabs (Pioglitazone hcl) .Marland Kitchen... 1 by mouth once daily (holding for edema)  Complete Medication List: 1)  Lopid 600 Mg Tabs (Gemfibrozil) .... Take one by mouth daily 2)  Mavik 4 Mg Tabs (Trandolapril) .Marland Kitchen.. 1 by mouth once daily 3)  Estrace 1 Mg Tabs (Estradiol) .... 1/2 tab by mouth once daily 4)  Calcium Lactate 750 Mg Tabs (Calcium lactate) .... Take two by mouth daily 5)  Tetracycline Hcl 500 Mg Caps (Tetracycline hcl) .... Take one by mouth daily 6)  Vitamin B-12 Cr 1000 Mcg Tbcr (Cyanocobalamin) .... One injection every 8 weeks 7)  Vesicare 10 Mg Tabs (Solifenacin succinate) .... Take one by mouth daily 8)  Vitamin D 1000 Iu  9)  Actos 30  Mg Tabs (Pioglitazone hcl) .Marland Kitchen.. 1 by mouth once daily (holding for edema) 10)  Centrum Tabs (Multiple vitamins-minerals) .... Take 1 tablet by mouth once a day  Patient Instructions: 1)  stop your actos entirely 2)  watch your diet carefully for diabetes  3)  check sugar two times a day  for next 3-4 days and know that it will go up  4)  I am thinking about trying [place] -- for diabetes - but I want to wait and see if swelling improved off the actos  5)  call and update me monday - with update on swelling and also blood sugar   Prior Medications (reviewed today): LOPID 600 MG  TABS (GEMFIBROZIL) take one by mouth daily MAVIK 4 MG TABS (TRANDOLAPRIL) 1 by mouth once daily ESTRACE 1 MG TABS (ESTRADIOL) 1/2 tab by mouth once daily CALCIUM LACTATE 750 MG  TABS (CALCIUM LACTATE) take two by mouth daily TETRACYCLINE HCL 500 MG  CAPS (TETRACYCLINE HCL) take one by mouth daily VITAMIN B-12 CR 1000 MCG  TBCR (CYANOCOBALAMIN) one injection every 8 weeks VESICARE 10 MG  TABS (SOLIFENACIN SUCCINATE) take one by mouth daily VITAMIN D 1000 IU ()  CENTRUM  TABS (MULTIPLE VITAMINS-MINERALS) Take 1 tablet by mouth once a day Current Allergies (reviewed today): ! VOLTAREN Current Medications (including changes made in today's visit):  LOPID 600 MG  TABS (GEMFIBROZIL) take one by mouth daily MAVIK 4 MG TABS (TRANDOLAPRIL) 1 by mouth once daily ESTRACE 1 MG TABS (ESTRADIOL) 1/2 tab by mouth once daily CALCIUM LACTATE 750 MG  TABS (CALCIUM LACTATE) take two by mouth daily TETRACYCLINE HCL 500 MG  CAPS (TETRACYCLINE HCL) take one by mouth daily VITAMIN B-12 CR 1000 MCG  TBCR (CYANOCOBALAMIN) one injection every 8 weeks VESICARE 10 MG  TABS (SOLIFENACIN SUCCINATE) take one by mouth daily * VITAMIN D 1000 IU  ACTOS 30 MG TABS (PIOGLITAZONE HCL) 1 by mouth once daily (holding for edema) CENTRUM  TABS (MULTIPLE VITAMINS-MINERALS) Take 1 tablet by mouth once a day

## 2010-09-19 NOTE — Assessment & Plan Note (Signed)
Summary: LEFT LEG AND SIDE HURTING/RBH   Vital Signs:  Patient profile:   77 year old female Height:      63 inches Weight:      219.25 pounds BMI:     38.98 Temp:     98.1 degrees F oral Pulse rate:   72 / minute Pulse rhythm:   regular BP sitting:   160 / 88  (left arm) Cuff size:   large  Vitals Entered By: Ozzie Hoyle LPN (November 15, 624THL 10:22 AM)  History of Present Illness: is having really sharp pains in L side and up under her breast  and her L leg is really sore and with a rash   no fever or chills  is really worn out   pain started friday at 1 am-- advil helped for a while getting worse since then   has appt tomorrow with allergist all over itch is the same       Allergies: 1)  ! Voltaren  Physical Exam  Skin:  vesicular rash at top of buttock cleft and to the L also L inner thigh   Impression & Recommendations:  Problem # 1:  SHINGLES (ICD-053.9) L side with severe pain and rash start on valtrex 1 g three times a day  vicodin with caution for pain  handout given aafp for info  disc sympt care and rest  f/u fri or earlier if needed   Complete Medication List: 1)  Lopid 600 Mg Tabs (Gemfibrozil) .... Take one by mouth daily 2)  Mavik 4 Mg Tabs (Trandolapril) .Marland Kitchen.. 1 by mouth once daily 3)  Estrace 1 Mg Tabs (Estradiol) .... 1/2 tab by mouth once daily 4)  Calcium Lactate 750 Mg Tabs (Calcium lactate) .... Take two by mouth daily 5)  Tetracycline Hcl 500 Mg Caps (Tetracycline hcl) .... Take one by mouth daily 6)  Vitamin B-12 Cr 1000 Mcg Tbcr (Cyanocobalamin) .... One injection every 8 weeks 7)  Vesicare 10 Mg Tabs (Solifenacin succinate) .... Take one by mouth daily 8)  Vitamin D 1000 Iu  .... One daily 9)  Centrum Tabs (Multiple vitamins-minerals) .... Take 1 tablet by mouth once a day 10)  Lasix 20 Mg Tabs (Furosemide) .Marland Kitchen.. 1 by mouth each am 11)  Glucotrol Xl 10 Mg Xr24h-tab (Glipizide) .... Take 1 tablet by mouth once a day 12)   Triamcinolone Acetonide 0.025 % Lotn (Triamcinolone acetonide) .... Apply to affected area once daily as needed 13)  Benadryl 25 Mg Tabs (Diphenhydramine hcl) .... Otc as directed. 14)  Valtrex 1 Gm Tabs (Valacyclovir hcl) .Marland Kitchen.. 1 by mouth three times a day for 7 days for shingles 15)  Vicodin 5-500 Mg Tabs (Hydrocodone-acetaminophen) .Marland Kitchen.. 1 by mouth up to every 4-6 hours as needed pain  Patient Instructions: 1)  take the valtrex three times a day for 7 days for shingles 2)  use vicodin with caution for pain-- it will sedate  3)  also it can cause constipation- so you may want to take a stool softener 4)  follow up with me on friday -- let me know in meantime if any changes or if you get worse  Prescriptions: VICODIN 5-500 MG TABS (HYDROCODONE-ACETAMINOPHEN) 1 by mouth up to every 4-6 hours as needed pain  #30 x 0   Entered and Authorized by:   [name] MD   Signed by:   [name] MD on 07/04/2009   Method used:   Print then Give to Patient  RxIDZM:[id number] VALTREX 1 GM TABS (VALACYCLOVIR HCL) 1 by mouth three times a day for 7 days for shingles  #21 x 0   Entered and Authorized by:   [name] MD   Signed by:   [name] MD on 07/04/2009   Method used:   Print then Give to Patient   RxID:   [id number]   Current Allergies (reviewed today): ! VOLTAREN  Appended Document: LEFT LEG AND SIDE HURTING/RBH     Allergies: 1)  ! Voltaren  Past History:  Past Medical History: Last updated: 06/22/2009 Diabetes mellitus, type II Hypertension Osteoarthritis Hyperlipidemia angioedema was poss from voltaren vitamin b12 deficiency 4/08 LVH and atrial enlargement by echo in past with nl EF degenerative disk dz osteopenia  pruritis    ortho -- Dr Tonita Cong GI - Dr Amedeo Plenty   Past Surgical History: Last updated: 12/20/2008 Cataract extraction Hysterectomy, BSO- fibroids LVH-  EF 60% Echo- bi-atrial enlargement, mild (05/1995) Colonoscopy- no  polyps (1995),   divertic., ? polyp (06/1998)  , diverticulosis (08/2003) L4- L5 disk disease- epidural inj. 12/1998 Dexa- osteopenia (04/2002) , stable (10/20050 Knee surgery- arthroscope spinal decomression sx 8/09 (12/09) colonoscopy polyp/ adenomatous - re check 5 y  (flex sig done 4/10 was nl -- so rec 5 y f/u) 4/10 dexa stable osteopenia   Family History: Last updated: 03/02/2008 sister CAD, brain tumor with hemorrhage   Social History: Last updated: 03/02/2008 husband is ill- she cares for him non smoker  no alcohol  works a lot outside in garden  Risk Factors: Smoking Status: never (11/27/2006)  Review of Systems General:  Complains of fatigue and loss of appetite; denies chills and fever. Eyes:  Denies blurring, discharge, and eye pain. ENT:  Denies sinus pressure and sore throat. CV:  Denies chest pain or discomfort and palpitations. Resp:  Denies cough and wheezing. GI:  Denies abdominal pain, change in bowel habits, and nausea. MS:  Denies joint redness and joint swelling. Derm:  Complains of itching, lesion(s), and rash. Neuro:  Denies numbness and tingling. Heme:  Denies abnormal bruising and bleeding.  Physical Exam  General:  overweight but generally well appearing  Head:  normocephalic, atraumatic, and no abnormalities observed.   no rash on face Mouth:  pharynx pink and moist.   Neck:  supple with full rom and no masses or thyromegally, no JVD or carotid bruit  Lungs:  Normal respiratory effort, chest expands symmetrically. Lungs are clear to auscultation, no crackles or wheezes. Heart:  Normal rate and regular rhythm. S1 and S2 normal without gallop, murmur, click, rub or other extra sounds. Skin:  rash in dermatomes of L1 and S2 -- vesicular and one sided  Cervical Nodes:  No lymphadenopathy noted Inguinal Nodes:  No significant adenopathy Psych:  nl affect    Complete Medication List: 1)  Lopid 600 Mg Tabs (Gemfibrozil) .... Take one by mouth  daily 2)  Mavik 4 Mg Tabs (Trandolapril) .Marland Kitchen.. 1 by mouth once daily 3)  Estrace 1 Mg Tabs (Estradiol) .... 1/2 tab by mouth once daily 4)  Calcium Lactate 750 Mg Tabs (Calcium lactate) .... Take two by mouth daily 5)  Tetracycline Hcl 500 Mg Caps (Tetracycline hcl) .... Take one by mouth daily 6)  Vitamin B-12 Cr 1000 Mcg Tbcr (Cyanocobalamin) .... One injection every 8 weeks 7)  Vesicare 10 Mg Tabs (Solifenacin succinate) .... Take one by mouth daily 8)  Vitamin D 1000 Iu  .... One daily 9)  Centrum Tabs (Multiple vitamins-minerals) .... Take 1 tablet by mouth once a day 10)  Lasix 20 Mg Tabs (Furosemide) .Marland Kitchen.. 1 by mouth each am 11)  Glucotrol Xl 10 Mg Xr24h-tab (Glipizide) .... Take 1 tablet by mouth once a day 12)  Triamcinolone Acetonide 0.025 % Lotn (Triamcinolone acetonide) .... Apply to affected area once daily as needed 13)  Benadryl 25 Mg Tabs (Diphenhydramine hcl) .... Otc as directed. 14)  Valtrex 1 Gm Tabs (Valacyclovir hcl) .Marland Kitchen.. 1 by mouth three times a day for 7 days for shingles 15)  Ultram 50 Mg Tabs (Tramadol hcl) .Marland Kitchen.. 1 by mouth up to three times a day as needed pain

## 2010-09-19 NOTE — Medication Information (Signed)
Summary: [facility]   [facility]   Imported By: Edmonia James 02/01/2010 11:09:18  _____________________________________________________________________  External Attachment:    Type:   Image     Comment:   External Document

## 2010-09-19 NOTE — Assessment & Plan Note (Signed)
Summary: 3MTH F/U AFTER LABS / LFW   Vital Signs:  Patient profile:   77 year old female Height:      63 inches Weight:      216.50 pounds BMI:     38.49 Temp:     97.8 degrees F oral Pulse rate:   72 / minute Pulse rhythm:   regular BP sitting:   130 / 80  (left arm) Cuff size:   large  Vitals Entered By: Ozzie Hoyle LPN (March 23, 624THL [id number] AM) CC: three month follow up after labs   History of Present Illness: here for f/u of DM and HTN and lipids and renal insuff  wt is up 7 lb with bmi 38  bp is fairly stable   DM worse with AIC 7.9 (had pred in dec) -- that was 3 mo ago  on glucotrol  her sugars are not good -- was 198 this am -- usually runs 190s or 200s  diet has not been good for past 3 months -- and unable to get out to do exercise  is eating too much  appetite is very good - stays hungry  now will stay more busy outside  is having trouble getting down steps for exercise equiptment    lipids fair with LDL down from 123 to 114   renal insuff imp with cr of 1.3  she went to kidney doctor  disc her pain in her legs -- and interested in lyrica or neurontin -- may consider that     Allergies: 1)  ! Voltaren 2)  ! * Actos  Past History:  Past Medical History: Last updated: 08/08/2009 Diabetes mellitus, type II Hypertension Osteoarthritis Hyperlipidemia angioedema was poss from voltaren vitamin b12 deficiency 4/08 LVH and atrial enlargement by echo in past with nl EF degenerative disk dz osteopenia  pruritis  renal insufficiency  ortho -- Dr Tonita Cong GI - Dr Amedeo Plenty   Past Surgical History: Last updated: 12/20/2008 Cataract extraction Hysterectomy, BSO- fibroids LVH-  EF 60% Echo- bi-atrial enlargement, mild (05/1995) Colonoscopy- no polyps (1995),   divertic., ? polyp (06/1998)  , diverticulosis (08/2003) L4- L5 disk disease- epidural inj. 12/1998 Dexa- osteopenia (04/2002) , stable (10/20050 Knee surgery- arthroscope spinal decomression sx  8/09 (12/09) colonoscopy polyp/ adenomatous - re check 5 y  (flex sig done 4/10 was nl -- so rec 5 y f/u) 4/10 dexa stable osteopenia   Family History: Last updated: 03/02/2008 sister CAD, brain tumor with hemorrhage   Social History: Last updated: 03/02/2008 husband is ill- she cares for him non smoker  no alcohol  works a lot outside in garden  Risk Factors: Smoking Status: never (11/27/2006)  Review of Systems General:  Complains of fatigue; denies loss of appetite, malaise, and weight loss. Eyes:  Denies blurring and eye irritation. CV:  Denies chest pain or discomfort, palpitations, shortness of breath with exertion, and swelling of feet. Resp:  Denies cough and wheezing. GI:  Denies abdominal pain, bloody stools, change in bowel habits, and indigestion. GU:  Denies dysuria and incontinence. MS:  Complains of joint pain and stiffness; denies joint redness and joint swelling. Derm:  Denies poor wound healing and rash; still has pain from shingles . Neuro:  Denies numbness and tingling. Psych:  Complains of depression. Endo:  Denies cold intolerance, excessive thirst, excessive urination, and heat intolerance. Heme:  Denies abnormal bruising and bleeding.  Physical Exam  General:  overweight but generally well appearing-- wt gain noted   Head:  normocephalic, atraumatic, and no abnormalities observed.   Eyes:  vision grossly intact, pupils equal, pupils round, and pupils reactive to light.  no conjunctival pallor, injection or icterus  Neck:  supple with full rom and no masses or thyromegally, no JVD or carotid bruit  Lungs:  Normal respiratory effort, chest expands symmetrically. Lungs are clear to auscultation, no crackles or wheezes. Heart:  Normal rate and regular rhythm. S1 and S2 normal without gallop, murmur, click, rub or other extra sounds. Msk:  no acute joint changes  poor rom knees  Pulses:  R and L carotid,radial,femoral,dorsalis pedis and posterior tibial  pulses are full and equal bilaterally Extremities:  trace LE edema Neurologic:  sensation intact to light touch, gait normal, and DTRs symmetrical and normal.   Skin:  dermatomal rash on LE is gone except for some mild hyperpigmentation Cervical Nodes:  No lymphadenopathy noted Inguinal Nodes:  No significant adenopathy Psych:  somewhat blunted affect    Impression & Recommendations:  Problem # 1:  RENAL INSUFFICIENCY (ICD-588.9) Assessment Improved overall doing better- seeing renal and with better bp - addn of amlodipine  cr is 1.3 adv to keep up good water intake   Problem # 2:  HYPERLIPIDEMIA (ICD-272.4) Assessment: Improved  stable - not quite at goal working on better diet trig are controlled with lopid- but LDL needs to be lower disc low sat fat diet in detail  Her updated medication list for this problem includes:    Lopid 600 Mg Tabs (Gemfibrozil) .Marland Kitchen... Take one by mouth daily  Labs Reviewed: SGOT: 16 (11/07/2009)   SGPT: 19 (11/07/2009)   HDL:36.60 (11/07/2009), 33.50 (06/17/2009)  LDL:114 (11/07/2009), 123 (06/17/2009)  Chol:178 (11/07/2009), 191 (06/17/2009)  Trig:137.0 (11/07/2009), 172.0 (06/17/2009)  Problem # 3:  HYPERTENSION (ICD-401.9) Assessment: Improved  imp with addn of amlodipine watching renal fxn The following medications were removed from the medication list:    Lasix 20 Mg Tabs (Furosemide) .Marland Kitchen... 1 by mouth each am Her updated medication list for this problem includes:    Mavik 4 Mg Tabs (Trandolapril) .Marland Kitchen... 1 by mouth once daily    Amlodipine Besylate 2.5 Mg Tabs (Amlodipine besylate) .Marland Kitchen... Take 1 tablet by mouth once a day  BP today: 130/80 Prior BP: 140/74 (08/08/2009)  Labs Reviewed: K+: 4.2 (11/07/2009) Creat: : 1.3 (11/07/2009)   Chol: 178 (11/07/2009)   HDL: 36.60 (11/07/2009)   LDL: 114 (11/07/2009)   TG: 137.0 (11/07/2009)  Problem # 4:  DIABETES MELLITUS, TYPE II (ICD-250.00) Assessment: Deteriorated  this is worse with poor  diet and no exercise plans to change - disc this in detail for opthy f/u in april  plan to add either byetta or lantus- pt will check on prices of these and call back lab/ f/u 3 mo as planned  Her updated medication list for this problem includes:    Mavik 4 Mg Tabs (Trandolapril) .Marland Kitchen... 1 by mouth once daily    Glucotrol Xl 10 Mg Xr24h-tab (Glipizide) .Marland Kitchen... Take 1 tablet by mouth once a day  Labs Reviewed: Creat: 1.3 (11/07/2009)     Last Eye Exam: normal (11/24/2008) Reviewed HgBA1c results: 7.9 (11/07/2009)  7.0 (06/17/2009)  Problem # 5:  POSTHERPETIC NEURALGIA (ICD-053.19) Assessment: Unchanged some post herp- neurop in leg  may try neurontin disc side eff px given to consider -- pt is worried about poss side eff of edema warned of sedation   Complete Medication List: 1)  Lopid 600 Mg Tabs (Gemfibrozil) .... Take one by mouth daily 2)  Mavik 4 Mg Tabs (Trandolapril) .Marland Kitchen.. 1 by mouth once daily 3)  Calcium Lactate 750 Mg Tabs (Calcium lactate) .... Take two by mouth daily 4)  Tetracycline Hcl 500 Mg Caps (Tetracycline hcl) .... Take one by mouth daily 5)  Vitamin B-12 Cr 1000 Mcg Tbcr (Cyanocobalamin) .... One injection every 8 weeks 6)  Vesicare 10 Mg Tabs (Solifenacin succinate) .... Take one by mouth daily 7)  Vitamin D 1000 Iu  .... One daily 8)  Centrum Tabs (Multiple vitamins-minerals) .... Take 1 tablet by mouth once a day 9)  Glucotrol Xl 10 Mg Xr24h-tab (Glipizide) .... Take 1 tablet by mouth once a day 10)  Triamcinolone Acetonide 0.025 % Lotn (Triamcinolone acetonide) .... Apply to affected area once daily as needed 11)  Benadryl 25 Mg Tabs (Diphenhydramine hcl) .... Otc as directed. 12)  Amlodipine Besylate 2.5 Mg Tabs (Amlodipine besylate) .... Take 1 tablet by mouth once a day 13)  Neurontin 100 Mg Caps (Gabapentin) .Marland Kitchen.. 1-2 by mouth up to three times a day as needed for leg pain  watch out for sedation  Patient Instructions: 1)  start cutting portions by 1/3rd   2)  keep watching sugar 3)  next medicine to consider for diabetes would be lantus insulin or byetta -- check on prices and let me know  4)  work hard to get at least 20 minutes of exercise per day  5)  no other medicine changes  6)  schedule lab in 3 months and then f/u AIC , microalbumin / renal 250.0  7)  try neurontin if you want to for leg pain- watch for sedation  Prescriptions: AMLODIPINE BESYLATE 2.5 MG TABS (AMLODIPINE BESYLATE) Take 1 tablet by mouth once a day  #90 x 3   Entered and Authorized by:   [name] MD   Signed by:   [name] MD on 11/09/2009   Method used:   Print then Give to Patient   RxID:   [id number] NEURONTIN 100 MG CAPS (GABAPENTIN) 1-2 by mouth up to three times a day as needed for leg pain  watch out for sedation  #60 x 2   Entered and Authorized by:   [name] MD   Signed by:   [name] MD on 11/09/2009   Method used:   Print then Give to Patient   RxID:   [id number]   Current Allergies (reviewed today): ! VOLTAREN ! * ACTOS

## 2010-09-19 NOTE — Letter (Signed)
Summary: Results Follow up Letter   at [facility]  [address] [place], [place] [address]   Phone: [phone]  Fax: [phone]    12/05/2007 MRN: [id number]  Renee [name] [name], [place]  [address]  Dear Renee [name],  The following are the results of your recent test(s):  Test         Result    Pap Smear:        Normal _____  Not Normal _____ Comments: ______________________________________________________ Cholesterol: LDL(Bad cholesterol):         Your goal is less than:         HDL (Good cholesterol):       Your goal is more than: Comments:  ______________________________________________________ Mammogram:        Normal _X____  Not Normal _____ Comments:  ___________________________________________________________________ Hemoccult:        Normal _____  Not normal _______ Comments:    _____________________________________________________________________ Other Tests:    We routinely do not discuss normal results over the telephone.  If you desire a copy of the results, or you have any questions about this information we can discuss them at your next office visit.   Sincerely,   Dr. Glori Bickers

## 2010-09-19 NOTE — Miscellaneous (Signed)
Summary: px for tetracycline  Clinical Lists Changes  Medications: Rx of TETRACYCLINE HCL 500 MG  CAPS (TETRACYCLINE HCL) take one by mouth daily;  #90 x 3;  Signed;  Entered by: [name] MD;  Authorized by: Carmell Austria Tower MD;  Method used: Printed then faxed to    Prescriptions: TETRACYCLINE HCL 500 MG  CAPS (TETRACYCLINE HCL) take one by mouth daily  #90 x 3   Entered and Authorized by:   [name] MD   Signed by:   [name] MD on 12/13/2008   Method used:   Printed then faxed to ...         RxIDBA:[id number]

## 2010-09-19 NOTE — Miscellaneous (Signed)
  Clinical Lists Changes  Medications: Changed medication from ESTRACE 0.1 MG/GM  CREA (ESTRADIOL) take one half by mouth daily to ESTRACE 1 MG TABS (ESTRADIOL) 1/2 tab by mouth once daily - Signed Rx of ESTRACE 1 MG TABS (ESTRADIOL) 1/2 tab by mouth once daily;  #45 x 3;  Signed;  Entered by: [name];  Authorized by: [name] MD;  Method used: Print then Give to Patient    Prescriptions: ESTRACE 1 MG TABS (ESTRADIOL) 1/2 tab by mouth once daily  #45 x 3   Entered by:   [name]   Authorized by:   [name] MD   Signed by:   [name] on 11/09/2008   Method used:   Print then Give to Patient   RxID:   [id number]

## 2010-09-19 NOTE — Consult Note (Signed)
Summary: [facility]   Imported By: Laural Benes 04/07/2010 14:19:33  _____________________________________________________________________  External Attachment:    Type:   Image     Comment:   External Document

## 2010-09-19 NOTE — Progress Notes (Signed)
Summary: ok to take glypizide?  Phone Note Call from Patient Call back at Home Phone [phone]   Caller: Patient Call For: [name] MD Summary of Call: Pt is asking if it's ok for her to take the glypizide that she has on hand, since her blood sugars are up, until she sees you again later this month.  Please advise. Initial call taken by: Marty Heck CMA,  March 29, 2009 10:23 AM  Follow-up for Phone Call        that is fine to start -- glipizide 10 -- and update me if low sugar or other side eff will disc in more detail at follow up appt  Follow-up by: [name] MD,  March 29, 2009 10:46 AM  Additional Follow-up for Phone Call Additional follow up Details #1::        Advised patient.  ......................................................Marland KitchenDaralene Milch CMA March 29, 2009 11:24 AM

## 2010-09-19 NOTE — Assessment & Plan Note (Signed)
Summary: 3 MONTH FOLLOW UP/[facility]   Vital Signs:  Patient profile:   77 year old female Height:      63 inches Weight:      213.25 pounds BMI:     37.91 Temp:     97.3 degrees F oral Pulse rate:   68 / minute Pulse rhythm:   regular BP sitting:   128 / 76  (left arm) Cuff size:   large  Vitals Entered By: Ozzie Hoyle LPN (July  1, 624THL [id number] AM) CC: 3 month f/u   History of Present Illness: here for f/u of DM and HTN and renal insuff  overall feels pretty good  more eye problems and hearing is not as good as usuall -- wants to check ears  thinks blurring from DM     bp is better 128/76 after addn of hydralazine  she has been keeping a bp log  some bp at home are high -- 140s to 150s and others are fine  last ones after addig hydralazine -- better  decided to take it two times a day instead of three times a day   some swelling in ankles and goes down at night  worse if on her feet all day    on glucotrol for DM   AIc up to 8.5- realizes this is worse  some days sugars are better than others -- worse if she eats beef   exercise in garden -- none extra  nl micralb lost 3 lb  this cr was 1.5- stable   has been under a lot of pressure lately lost her brother - had been at [place] a lot with him -- he died of copd  has good support and doing pretty good    Allergies: 1)  ! Voltaren 2)  ! * Actos  Past History:  Past Medical History: Last updated: 08/08/2009 Diabetes mellitus, type II Hypertension Osteoarthritis Hyperlipidemia angioedema was poss from voltaren vitamin b12 deficiency 4/08 LVH and atrial enlargement by echo in past with nl EF degenerative disk dz osteopenia  pruritis  renal insufficiency  ortho -- Dr Tonita Cong GI - Dr Amedeo Plenty   Past Surgical History: Last updated: 12/20/2008 Cataract extraction Hysterectomy, BSO- fibroids LVH-  EF 60% Echo- bi-atrial enlargement, mild (05/1995) Colonoscopy- no polyps (1995),   divertic., ? polyp (06/1998)   , diverticulosis (08/2003) L4- L5 disk disease- epidural inj. 12/1998 Dexa- osteopenia (04/2002) , stable (10/20050 Knee surgery- arthroscope spinal decomression sx 8/09 (12/09) colonoscopy polyp/ adenomatous - re check 5 y  (flex sig done 4/10 was nl -- so rec 5 y f/u) 4/10 dexa stable osteopenia   Family History: Last updated: 02/17/2010 sister CAD, brain tumor with hemorrhage  brother died of copd   Social History: Last updated: 03/02/2008 husband is ill- she cares for him non smoker  no alcohol  works a lot outside in garden  Risk Factors: Smoking Status: never (11/27/2006)  Family History: sister CAD, brain tumor with hemorrhage  brother died of copd   Review of Systems General:  Denies fatigue, loss of appetite, and malaise. Eyes:  Denies blurring and eye irritation. CV:  Denies chest pain or discomfort, near fainting, and palpitations. Resp:  Denies cough, shortness of breath, and wheezing. GI:  Denies abdominal pain, gas, and indigestion. GU:  Complains of urinary frequency. MS:  Denies joint pain. Derm:  Denies itching, lesion(s), poor wound healing, and rash. Neuro:  Denies numbness and tingling. Psych:  Complains of depression; denies panic attacks and  sense of great danger. Endo:  Denies excessive thirst, excessive urination, and heat intolerance. Heme:  Denies abnormal bruising and bleeding.  Physical Exam  General:  overweight but generally well appearing-- wt gain noted   Head:  normocephalic, atraumatic, and no abnormalities observed.   Eyes:  vision grossly intact, pupils equal, pupils round, and pupils reactive to light.   Mouth:  pharynx pink and moist.   Neck:  supple with full rom and no masses or thyromegally, no JVD or carotid bruit  Lungs:  Normal respiratory effort, chest expands symmetrically. Lungs are clear to auscultation, no crackles or wheezes. Heart:  Normal rate and regular rhythm. S1 and S2 normal without gallop, murmur, click, rub  or other extra sounds. Abdomen:  Bowel sounds positive,abdomen soft and non-tender without masses, organomegaly or hernias noted. no renal bruits  Msk:  No deformity or scoliosis noted of thoracic or lumbar spine.   Pulses:  R and L carotid,radial,femoral,dorsalis pedis and posterior tibial pulses are full and equal bilaterally Extremities:  trace LE edema Neurologic:  sensation intact to light touch, gait normal, and DTRs symmetrical and normal.   Skin:  Intact without suspicious lesions or rashes Cervical Nodes:  No lymphadenopathy noted Inguinal Nodes:  No significant adenopathy Psych:  is generally sad today but not tearful  good insight and motivation  Diabetes Management Exam:    Foot Exam (with socks and/or shoes not present):       Sensory-Pinprick/Light touch:          Left medial foot (L-4): normal          Left dorsal foot (L-5): normal          Left lateral foot (S-1): normal          Right medial foot (L-4): normal          Right dorsal foot (L-5): normal          Right lateral foot (S-1): normal       Sensory-Monofilament:          Left foot: normal          Right foot: normal       Inspection:          Left foot: normal          Right foot: normal       Nails:          Left foot: normal          Right foot: normal   Impression & Recommendations:  Problem # 1:  RENAL INSUFFICIENCY (ICD-588.9) Assessment Comment Only continue renal f/u and bp control  stable cr 1.5  Problem # 2:  B12 DEFICIENCY (ICD-266.2) Assessment: Comment Only  shot today  Orders: Vit B12 1000 mcg (J3420) Admin of Therapeutic Inj  intramuscular or subcutaneous [id number])  Problem # 3:  HYPERLIPIDEMIA (ICD-272.4) Assessment: Unchanged  check lipids 3 mo and f/u  disc low sat fat diet  Her updated medication list for this problem includes:    Lopid 600 Mg Tabs (Gemfibrozil) .Marland Kitchen... Take one by mouth daily  Labs Reviewed: SGOT: 16 (11/07/2009)   SGPT: 19 (11/07/2009)   HDL:36.60  (11/07/2009), 33.50 (06/17/2009)  LDL:114 (11/07/2009), 123 (06/17/2009)  Chol:178 (11/07/2009), 191 (06/17/2009)  Trig:137.0 (11/07/2009), 172.0 (06/17/2009)  Problem # 4:  HYPERTENSION (ICD-401.9) Assessment: Improved  imp with hydralazine- but needs to take as directed three times a day  rev diet  add exercise and work on wt loss  The following  medications were removed from the medication list:    Amlodipine Besylate 2.5 Mg Tabs (Amlodipine besylate) .Marland Kitchen... Take 1 tablet by mouth once a day Her updated medication list for this problem includes:    Mavik 4 Mg Tabs (Trandolapril) .Marland Kitchen... 1 by mouth once daily    Amlodipine Besylate 10 Mg Tabs (Amlodipine besylate) .Marland Kitchen... Take 1 tablet by mouth once a day    Hydralazine Hcl 25 Mg Tabs (Hydralazine hcl) .Marland Kitchen... Take one tablet by mouth three times a day  BP today: 128/76 Prior BP: 130/80 (11/09/2009)  Labs Reviewed: K+: 4.3 (02/14/2010) Creat: : 1.5 (02/14/2010)   Chol: 178 (11/07/2009)   HDL: 36.60 (11/07/2009)   LDL: 114 (11/07/2009)   TG: 137.0 (11/07/2009)  Problem # 5:  DIABETES MELLITUS, TYPE II (ICD-250.00) poorly controlled start the lantus at 10 u and will go up in 2 wk intervals based on sugar reports  disc healthy diet (low simple sugar/ choose complex carbs/ low sat fat) diet and exercise in detail  wt loss stressed lab and f/u 3 mo  Her updated medication list for this problem includes:    Mavik 4 Mg Tabs (Trandolapril) .Marland Kitchen... 1 by mouth once daily    Glucotrol Xl 10 Mg Xr24h-tab (Glipizide) .Marland Kitchen... Take 1 tablet by mouth once a day    Lantus Solostar 100 Unit/ml Soln (Insulin glargine) ..... Inject 10 units once a day or as directed  Complete Medication List: 1)  Lopid 600 Mg Tabs (Gemfibrozil) .... Take one by mouth daily 2)  Mavik 4 Mg Tabs (Trandolapril) .Marland Kitchen.. 1 by mouth once daily 3)  Calcium Lactate 750 Mg Tabs (Calcium lactate) .... Take two by mouth daily 4)  Tetracycline Hcl 500 Mg Caps (Tetracycline hcl) .... Take one  by mouth daily 5)  Vitamin B-12 Cr 1000 Mcg Tbcr (Cyanocobalamin) .... One injection every 8 weeks 6)  Vesicare 10 Mg Tabs (Solifenacin succinate) .... Take one by mouth daily 7)  Vitamin D 1000 Iu  .... One daily 8)  Centrum Tabs (Multiple vitamins-minerals) .... Take 1 tablet by mouth once a day 9)  Glucotrol Xl 10 Mg Xr24h-tab (Glipizide) .... Take 1 tablet by mouth once a day 10)  Triamcinolone Acetonide 0.025 % Lotn (Triamcinolone acetonide) .... Apply to affected area once daily as needed 11)  Benadryl 25 Mg Tabs (Diphenhydramine hcl) .... Otc as directed. 12)  Amlodipine Besylate 10 Mg Tabs (Amlodipine besylate) .... Take 1 tablet by mouth once a day 13)  Hydralazine Hcl 25 Mg Tabs (Hydralazine hcl) .... Take one tablet by mouth three times a day 14)  Lantus Solostar 100 Unit/ml Soln (Insulin glargine) .... Inject 10 units once a day or as directed 15)  Pin Needles For Lantus Solostar  .... To inject once daily as directed  Patient Instructions: 1)  go back up on hydralazine to three times a day instead of two times a day  2)  inject lantus 10 units once daily in evening 3)  check sugar two times a day and send me report in 2 weeks  4)  I will continue to adjust until sugars improve  5)  schedule fasting labs and f/u in 3 months lipid/ast/alt / Wyline Beady Neysa Hotter 250.0 Prescriptions: HYDRALAZINE HCL 25 MG TABS (HYDRALAZINE HCL) take one tablet by mouth three times a day  #270 x 3   Entered and Authorized by:   [name] MD   Signed by:   [name] MD on 02/17/2010   Method used:  Print then Give to Patient   RxID:   [id number] AMLODIPINE BESYLATE 10 MG TABS (AMLODIPINE BESYLATE) Take 1 tablet by mouth once a day  #90 x 3   Entered and Authorized by:   [name] MD   Signed by:   [name] MD on 02/17/2010   Method used:   Print then Give to Patient   RxID:   [id number] GLUCOTROL XL 10 MG XR24H-TAB (GLIPIZIDE) Take 1 tablet by mouth once a day  #90 x  3   Entered and Authorized by:   [name] MD   Signed by:   [name] MD on 02/17/2010   Method used:   Print then Give to Patient   RxID:   [id number] VESICARE 10 MG  TABS (SOLIFENACIN SUCCINATE) take one by mouth daily  #90 x 3   Entered and Authorized by:   [name] MD   Signed by:   [name] MD on 02/17/2010   Method used:   Print then Give to Patient   RxID:   [id number] TETRACYCLINE HCL 500 MG  CAPS (TETRACYCLINE HCL) take one by mouth daily  #90 x 3   Entered and Authorized by:   [name] MD   Signed by:   [name] MD on 02/17/2010   Method used:   Print then Give to Patient   RxID:   [id number] Irene 4 MG TABS (TRANDOLAPRIL) 1 by mouth once daily  #90 x 3   Entered and Authorized by:   [name] MD   Signed by:   [name] MD on 02/17/2010   Method used:   Print then Give to Patient   RxID:   [id number] LOPID 600 MG  TABS (GEMFIBROZIL) take one by mouth daily  #90 x 3   Entered and Authorized by:   [name] MD   Signed by:   [name] MD on 02/17/2010   Method used:   Print then Give to Patient   RxID:   [id number] LANTUS SOLOSTAR 100 UNIT/ML SOLN (INSULIN GLARGINE) inject 10 units once a day or as directed  #1 months x 3   Entered and Authorized by:   [name] MD   Signed by:   [name] MD on 02/17/2010   Method used:   Electronically to        [facility] (retail)       [address]       [place], [place]  [address]       Ph: [phone]       Fax: [phone]   RxID:   [id number]   Current Allergies (reviewed today): ! VOLTAREN ! * ACTOS   Medication Administration  Injection # 1:    Medication: Vit B12 1000 mcg    Diagnosis: B12 DEFICIENCY (ICD-266.2)    Route: IM    Site: L deltoid    Exp Date: 06/21/2011    Lot #: [id number]    Mfr: [employer]ent    Patient tolerated injection without complications    Given by: Ozzie Hoyle LPN (July   1, 624THL [id number] AM)  Orders Added: 1)  Vit B12 1000 mcg [J3420] 2)  Admin of Therapeutic Inj  intramuscular or subcutaneous [[id number]] 3)  Est. Patient Level IV [id number]   Prevention & Chronic Care Immunizations   Influenza vaccine: Fluvax 3+  (05/26/2004)    Tetanus booster: 05/21/1999: Td  Pneumococcal vaccine: Pneumovax  (09/26/2005)    H. zoster vaccine: Not documented  Colorectal Screening   Hemoccult: Negative  (10/17/2005)    Colonoscopy: Adenomatous Polyp  (08/06/2008)   Colonoscopy due: 08/2013  Other Screening   Pap smear: Normal  (10/02/2005)    Mammogram: Negative  (11/23/2008)   Mammogram action/deferral: Screening mammogram in 1 year.     (11/23/2008)    DXA bone density scan: osteopenia  (11/25/2008)   Smoking status: never  (11/27/2006)  Diabetes Mellitus   HgbA1C: 8.5  (02/14/2010)    Eye exam: diabetic retinopathy  (12/08/2009)   Eye exam due: 12/2010    Foot exam: yes  (02/17/2010)   High risk foot: Not documented   Foot care education: Not documented    Urine microalbumin/creatinine ratio: 0.6  (02/14/2010)  Lipids   Total Cholesterol: 178  (11/07/2009)   LDL: 114  (11/07/2009)   LDL Direct: Not documented   HDL: 36.60  (11/07/2009)   Triglycerides: 137.0  (11/07/2009)    SGOT (AST): 16  (11/07/2009)   SGPT (ALT): 19  (11/07/2009)   Alkaline phosphatase: 58  (08/17/2008)   Total bilirubin: 0.7  (08/17/2008)  Hypertension   Last Blood Pressure: 128 / 76  (02/17/2010)   Serum creatinine: 1.5  (02/14/2010)   Serum potassium 4.3  (02/14/2010)  Self-Management Support :    Diabetes self-management support: Not documented    Hypertension self-management support: Not documented    Lipid self-management support: Not documented

## 2010-09-19 NOTE — Assessment & Plan Note (Signed)
Summary: CLEARANCE FOR SURGERY/CLE   Vital Signs:  Patient Profile:   77 Years Old Female Weight:      207 pounds Temp:     97.5 degrees F oral Pulse rate:   64 / minute Pulse rhythm:   regular BP sitting:   130 / 78  (left arm) Cuff size:   large  Vitals Entered By: Emelia Salisbury (March 02, 2008 12:11 PM)                 Chief Complaint:  surgery clearance.  History of Present Illness: pt needs lumbar decompression for spinal stenosis-- Dr Tonita Cong sx has not been scheduled yet  will schedule after the garden is done   bp is better today- since she took her medicine  she is good about avoiding salt   a lot of stress with husband- better now   has had sx in past - with no problems  ? if this will be general aneth no troubles with anesth- local or general in past  no hx of blood clots   no chest pain or shortness of breath  has been in garden for hours - picking beans -- is able to do that  is limited with steps, reaching up , walking or raising R leg   sugars have been running 120s- 130 s  pretty good control with AIC 6.5  cholesterol is up a little with LDLD 117 and HDL 28.2   has never seen cardiologist     did in the meantime- step on pc of glass and was removed from the podiatrist  also had cortisone shot in foot (Dr Paulla Dolly)- for inflammation of tendons -- after favoring that foot no antibiotics  the f/b site is healed  R foot is still really hurting has f/u for that in 2 weeks     Current Allergies (reviewed today): ! VOLTAREN  Past Medical History:    Diabetes mellitus, type II    Hypertension    Osteoarthritis    Hyperlipidemia    angioedema was poss from voltaren    vitamin b12 deficiency 4/08    LVH and atrial enlargement by echo in past with nl EF  Past Surgical History:    Reviewed history from 11/27/2006 and no changes required:       Cataract extraction       Hysterectomy, BSO- fibroids       LVH-  EF 60%       Echo- bi-atrial  enlargement, mild (05/1995)       Colonoscopy- no polyps (1995),   divertic., ? polyp (06/1998)  , diverticulosis (08/2003)       L4- L5 disk disease- epidural inj. 12/1998       Dexa- osteopenia (04/2002) , stable (10/20050       Knee surgery- arthroscope   Family History:    Reviewed history from 11/27/2006 and no changes required:       sister CAD, brain tumor with hemorrhage   Social History:    husband is ill- she cares for him    non smoker     no alcohol     works a lot outside in garden    Review of [place]      Denies fatigue, fever, loss of appetite, malaise, and weakness.  Eyes      Denies blurring and eye pain.  CV      Denies chest pain or discomfort, palpitations, shortness of breath with exertion, and swelling  of feet.  Resp      Denies cough and shortness of breath.  GI      Denies bloody stools and change in bowel habits.  GU      Denies hematuria and urinary frequency.  MS      Complains of joint pain, low back pain, and stiffness.      Denies joint redness, joint swelling, cramps, and muscle weakness.  Derm      Denies itching, lesion(s), and rash.  Neuro      Denies headaches, memory loss, numbness, and tingling.  Psych      pt denies any mood change life is generally stressful   Endo      Denies excessive thirst and excessive urination.  Heme      Denies abnormal bruising and bleeding.  Allergy      Denies hives or rash and sneezing.      pt states she does not remember allergy to voltaren-- is stated in chart as angioedema    Physical Exam  General:     overweight but generally well appearing  Head:     normocephalic and atraumatic.   Eyes:     vision grossly intact, pupils equal, pupils round, pupils reactive to light, and no injection.   Mouth:     pharynx pink and moist.   Neck:     supple with full rom and no masses or thyromegally, no JVD or carotid bruit  Chest Wall:     No deformities, masses, or  tenderness noted. Lungs:     Normal respiratory effort, chest expands symmetrically. Lungs are clear to auscultation, no crackles or wheezes. Heart:     Normal rate and regular rhythm. S1 and S2 normal without gallop, murmur, click, rub or other extra sounds. PMI is non displaced  Abdomen:     Bowel sounds positive,abdomen soft and non-tender without masses, organomegaly or hernias noted.  no renal bruits  Msk:     mild kyphosis some periph changes of OA poor rom LS Pulses:     R and L carotid,radial,femoral,dorsalis pedis and posterior tibial pulses are full and equal bilaterally Extremities:     R ankle with tr-1 plus edema L ankle tr edema some varicosities- spider viens Neurologic:     strength normal in all extremities, sensation intact to light touch, gait normal, and DTRs symmetrical and normal.  no tremor  Skin:     Intact without suspicious lesions or rashes Cervical Nodes:     No lymphadenopathy noted Inguinal Nodes:     No significant adenopathy Psych:     quiet- somewhat blunted affect  is appropriate with nl memory     Impression & Recommendations:  Problem # 1:  BACK PAIN (ICD-724.5) Assessment: Deteriorated here for general medical clearance for back sx pt has tol anesthesia and sx well in past has remote hx of LVH with stable EKG and well controlled HTN no restrictions for sx at this time do recommend watching renal fxn-- has stable cr of 1.3 -- and avoidance of nsaids unless absolutely necessary (she is about to finish methocabamol in one more dose)- also in light of angioedema with voltaren in past  Her updated medication list for this problem includes:    Methocarbamol 500 Mg Tabs (Methocarbamol) .Marland Kitchen... Take one by mouth at bedtime   Problem # 2:  HYPERLIPIDEMIA (ICD-272.4) Assessment: Deteriorated last chol up slt disc low sat fat diet today will continue lopid for trig control  Her  updated medication list for this problem includes:    Lopid 600  Mg Tabs (Gemfibrozil) .Marland Kitchen... Take one by mouth daily  Labs Reviewed: Chol: 166 (01/26/2008)   HDL: 28.2 (01/26/2008)   LDL: 117 (01/26/2008)   TG: 103 (01/26/2008) SGOT: 14 (01/26/2008)   SGPT: 14 (01/26/2008)   Problem # 3:  HYPERTENSION (ICD-401.9) Assessment: Improved bp is well controlled on mavik will continue to watch bp and renal fxn closely f/u in sept as planned- will do labs then  Her updated medication list for this problem includes:    Mavik 2 Mg Tabs (Trandolapril) .Marland Kitchen... Take one by mouth daily  BP today: 130/78 Prior BP: 160/80 (01/26/2008)  Labs Reviewed: Creat: 1.3 (01/26/2008) Chol: 166 (01/26/2008)   HDL: 28.2 (01/26/2008)   LDL: 117 (01/26/2008)   TG: 103 (01/26/2008)   Problem # 4:  DIABETES MELLITUS, TYPE II (ICD-250.00) Assessment: Unchanged stable glucose control with glucotrol will need glucose monitoring for hosp/sx may need to hold glucotrol if NPO Her updated medication list for this problem includes:    Mavik 2 Mg Tabs (Trandolapril) .Marland Kitchen... Take one by mouth daily    Glucotrol Xl 10 Mg Tb24 (Glipizide) .Marland Kitchen... Take 1 tablet by mouth once a day  Labs Reviewed: HgBA1c: 6.5 (01/26/2008)   Creat: 1.3 (01/26/2008)      Complete Medication List: 1)  Lopid 600 Mg Tabs (Gemfibrozil) .... Take one by mouth daily 2)  Mavik 2 Mg Tabs (Trandolapril) .... Take one by mouth daily 3)  Estrace 0.1 Mg/gm Crea (Estradiol) .... Take one half by mouth daily 4)  Calcium Lactate 750 Mg Tabs (Calcium lactate) .... Take two by mouth daily 5)  Tetracycline Hcl 500 Mg Caps (Tetracycline hcl) .... Take one by mouth daily 6)  Vitamin B-12 Cr 1000 Mcg Tbcr (Cyanocobalamin) .... One injection every 8 weeks 7)  Vesicare 10 Mg Tabs (Solifenacin succinate) .... Take one by mouth daily 8)  Vitamin D 1000 Iu  9)  Glucotrol Xl 10 Mg Tb24 (Glipizide) .... Take 1 tablet by mouth once a day 10)  Methocarbamol 500 Mg Tabs (Methocarbamol) .... Take one by mouth at bedtime   Patient  Instructions: 1)  keep watching diet diet carefully for fat and sugar  2)  keep an eye on sugars - please check it occasionally in afternoon  3)  your blood pressure is better today on your medicine  4)  your labs are fairly stable -- cholesterol needs to be slightly lower 5)  follow up with podiatrist as scheduled for your foot pain 6)  once that is better - you are cleared for your back surgery  7)  your follow up here is in september- keep that appointment  8)  you can raise your HDL (good cholesterol) by increasing exercise and eating omega 3 fatty acid supplement like fish oil or flax seed oil over the counter 9)  you can lower LDL (bad cholesterol) by limiting saturated fats in diet like red meat, fried foods, egg yolks, fatty breakfast meats, high fat dairy products and shellfish   ] Current Allergies (reviewed today): ! VOLTAREN  EKG  Procedure date:  03/02/2008  Findings:      findings consistent with LVH- stable from previous EKG NSR with rate of 67

## 2010-09-19 NOTE — Procedures (Signed)
Summary: [name]/Colonoscopy/Dr[facility]/Colonoscopy/Dr. Amedeo Plenty   Imported By: Lanice Shirts 08/17/2008 15:48:46  _____________________________________________________________________  External Attachment:    Type:   Image     Comment:   External Document  Appended Document: [name]/Colonoscopy/Dr. Amedeo Plenty    Clinical Lists Changes  Observations: Added new observation of COLONNXTDUE: 08/2013 (08/17/2008 21:33) Added new observation of PAST MED HX: Diabetes mellitus, type II Hypertension Osteoarthritis Hyperlipidemia angioedema was poss from voltaren vitamin b12 deficiency 4/08 LVH and atrial enlargement by echo in past with nl EF degenerative disk dz   ortho -- Dr Tonita Cong GI - Dr Amedeo Plenty  (08/17/2008 21:33) Added new observation of PAST SURG HX: Cataract extraction Hysterectomy, BSO- fibroids LVH-  EF 60% Echo- bi-atrial enlargement, mild (05/1995) Colonoscopy- no polyps (1995),   divertic., ? polyp (06/1998)  , diverticulosis (08/2003) L4- L5 disk disease- epidural inj. 12/1998 Dexa- osteopenia (04/2002) , stable (10/20050 Knee surgery- arthroscope spinal decomression sx 8/09 (12/09) colonoscopy polyp/ adenomatous - re check 5 y  (08/17/2008 21:33) Added new observation of COLONOSCOPY: Adenomatous Polyp (08/06/2008 21:35)       Past Medical History:    Diabetes mellitus, type II    Hypertension    Osteoarthritis    Hyperlipidemia    angioedema was poss from voltaren    vitamin b12 deficiency 4/08    LVH and atrial enlargement by echo in past with nl EF    degenerative disk dz            ortho -- Dr Tonita Cong    GI - Dr Amedeo Plenty   Past Surgical History:    Cataract extraction    Hysterectomy, BSO- fibroids    LVH-  EF 60%    Echo- bi-atrial enlargement, mild (05/1995)    Colonoscopy- no polyps (1995),   divertic., ? polyp (06/1998)  , diverticulosis (08/2003)    L4- L5 disk disease- epidural inj. 12/1998  Dexa- osteopenia (04/2002) , stable (10/20050    Knee surgery- arthroscope    spinal decomression sx 8/09    (12/09) colonoscopy polyp/ adenomatous - re check 5 y    Preventive Care Screening  Colonoscopy:    Date:  08/06/2008    Next Due:  08/2013    Results:  Adenomatous Polyp

## 2010-09-19 NOTE — Assessment & Plan Note (Signed)
Summary: B12`                 Medication Administration  Injection # 1:    Medication: Vit B12 1000 mcg    Diagnosis: B12 DEFICIENCY (ICD-266.2)    Route: IM    Site: L deltoid    Exp Date: 09/2008    Lot #: [id number]    Mfr: american regent    Patient tolerated injection without complications    Given by: Marty Heck (Jan 10, 2007 8:19 AM)  Orders Added: 1)  Vit B12 1000 mcg [J3420] 2)  Admin of Therapeutic Inj  intramuscular or subcutaneous [90772]

## 2010-09-19 NOTE — Letter (Signed)
Summary: [facility]  [facility]   Imported By: Edmonia James 11/30/2008 12:30:00  _____________________________________________________________________  External Attachment:    Type:   Image     Comment:   External Document  Appended Document: [facility]    Clinical Lists Changes  Observations: Added new observation of DMEYEEXAMNXT: 11/2009 (12/05/2008 17:41) Added new observation of DMEYEEXMRES: normal (11/24/2008 17:41) Added new observation of EYE EXAM BY: Dr Thurston Pounds  (11/24/2008 17:41) Added new observation of DIAB EYE EX: normal (11/24/2008 17:41)        Diabetes Management Exam:    Eye Exam:       Eye Exam done elsewhere          Date: 11/24/2008          Results: normal          Done by: Dr Thurston Pounds

## 2010-09-19 NOTE — Assessment & Plan Note (Signed)
Summary: 2 MONTH FOLLOWUP/[facility]   Vital Signs:  Patient profile:   77 year old female Weight:      223 pounds BMI:     39.65 Temp:     97.5 degrees F oral Pulse rate:   72 / minute Pulse rhythm:   regular BP sitting:   130 / 80  (left arm) Cuff size:   large  Vitals Entered By: Marty Heck CMA (June 22, 2009 8:56 AM)  Serial Vital Signs/Assessments:  Time      Position  BP       Pulse  Resp  Temp     By                     130/80                         [name] MD  CC: 2 month follow up   History of Present Illness: here for 2 mo f/u of mult med issues  wt is up 3 lb  bp high today at 142/ 80 -- unusual for her   DM a bit worse with AIC of 7.0 sugars in am are usually 140s does not check in afternoons  is on glucotrol once daily 10 mg xl opthy- is up to date  is on ace not on asa--hx of angioedema on nsaids  overall diet is not as good- is snacking during ballgames - chips and candy  rest of time is following diabetic diet  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 Voltaren  Past History:  Past Surgical History: Last updated: 12/20/2008 Cataract extraction Hysterectomy, BSO- fibroids LVH-  EF 60% Echo- bi-atrial enlargement, mild (05/1995) Colonoscopy- no polyps (1995),   divertic., ? polyp (06/1998)  , diverticulosis (08/2003) L4- L5 disk disease- epidural inj. 12/1998 Dexa- osteopenia (04/2002) , stable  (10/20050 Knee surgery- arthroscope spinal decomression sx 8/09 (12/09) colonoscopy polyp/ adenomatous - re check 5 y  (flex sig done 4/10 was nl -- so rec 5 y f/u) 4/10 dexa stable osteopenia   Family History: Last updated: 03/02/2008 sister CAD, brain tumor with hemorrhage   Social History: Last updated: 03/02/2008 husband is ill- she cares for him non smoker  no alcohol  works a lot outside in garden  Risk Factors: Smoking Status: never (11/27/2006)  Past Medical History: Diabetes mellitus, type II Hypertension Osteoarthritis Hyperlipidemia angioedema was poss from voltaren vitamin b12 deficiency 4/08 LVH and atrial enlargement by echo in past with nl EF degenerative disk dz osteopenia  pruritis    ortho -- Dr Tonita Cong GI - Dr Amedeo Plenty   Review of Systems General:  Complains of fatigue; denies fever, loss of appetite, malaise, and sleep disorder. Eyes:  Denies blurring and eye irritation. CV:  Denies chest pain or discomfort and lightheadness. Resp:  Denies cough, shortness of breath, and wheezing. GI:  Denies abdominal pain, change in bowel habits, and indigestion. GU:  Denies urinary frequency. MS:  Denies muscle aches. Derm:  Complains of itching; denies dryness, insect bite(s), lesion(s), poor wound healing, and rash. Neuro:  Denies numbness and tingling. Psych:  mood is fair. Endo:  Denies cold intolerance, excessive thirst, excessive urination, and heat intolerance. Heme:  Denies abnormal bruising and bleeding.  Physical Exam  General:  overweight but generally well appearing  Head:  normocephalic, atraumatic, and no abnormalities observed.   Eyes:  vision grossly intact, pupils equal, pupils round, and pupils reactive to light.   Neck:  supple with full rom and no masses or thyromegally, no JVD or carotid bruit  Lungs:  Normal respiratory effort, chest expands symmetrically. Lungs are clear to auscultation, no crackles or wheezes. Heart:  Normal rate and  regular rhythm. S1 and S2 normal without gallop, murmur, click, rub or other extra sounds. Msk:  L cva tenderness - mild/improved  no spinal tenderness nl rom LS / TS  Pulses:  R and L carotid,radial,femoral,dorsalis pedis and posterior tibial pulses are full and equal bilaterally Extremities:  trace LE edema Neurologic:  sensation intact to light touch, gait normal, and DTRs symmetrical and normal.   Skin:  Intact without suspicious lesions or rashes evidence of pruritis and itching without rash (some excoriations  Cervical Nodes:  No lymphadenopathy noted Psych:  normal affect, talkative and pleasant   Diabetes Management Exam:    Foot Exam (with socks and/or shoes not present):       Sensory-Pinprick/Light touch:          Left medial foot (L-4): normal          Left dorsal foot (L-5): normal          Left lateral foot (S-1): normal          Right medial foot (L-4): normal          Right dorsal foot (L-5): normal          Right lateral foot (S-1): normal       Sensory-Monofilament:          Left foot: normal          Right foot: normal       Inspection:          Left foot: normal          Right foot: normal       Nails:          Left foot: normal          Right foot: normal   Impression & Recommendations:  Problem # 1:  RENAL INSUFFICIENCY (ICD-588.9) Assessment Deteriorated  inc in cr - to 1.5 think this warrants further eval has hx of HTN and DM - not in opt control  ua today neg dip and micro will ref to renal   Orders: Nephrology Referral (Nephro)  Problem # 2:  PRURITUS (ICD-698.9) Assessment: Deteriorated  without rash ? if all related ref to allergist hesitant to add/change med until this is figured out   Orders: Allergy Referral  (Allergy)  Problem # 3:  HYPERLIPIDEMIA (B2193296.4) Assessment: Unchanged  trig improved but LDL not at goal for DM  disc lower sat fat diet -- will plan to re check and then add statin if necessary rev low sat fat  diet  Her updated medication list for  this problem includes:    Lopid 600 Mg Tabs (Gemfibrozil) .Marland Kitchen... Take one by mouth daily  Labs Reviewed: SGOT: 18 (06/17/2009)   SGPT: 21 (06/17/2009)   HDL:33.50 (06/17/2009), 29.00 (02/14/2009)  LDL:123 (06/17/2009), 115 (02/14/2009)  Chol:191 (06/17/2009), 175 (02/14/2009)  Trig:172.0 (06/17/2009), 156.0 (02/14/2009)  Problem # 4:  DIABETES MELLITUS, TYPE II (ICD-250.00) overall - not quite at goal  suspect post prandial sugars are high adv pt to check them two times a day and send report  avoiding metformin due to renal insuff and actos in light of swelling disc healthy diet (low simple sugar/ choose complex carbs/ low sat fat) diet and exercise in detail  Her updated medication list for this problem includes:    Mavik 4 Mg Tabs (Trandolapril) .Marland Kitchen... 1 by mouth once daily    Glucotrol Xl 10 Mg Xr24h-tab (Glipizide) .Marland Kitchen... Take 1 tablet by mouth once a day  Problem # 5:  HYPERTENSION (ICD-401.9) Assessment: Unchanged  bp better on second check today no change in meds disc healthy diet (low simple sugar/ choose complex carbs/ low sat fat) diet and exercise in detail  Her updated medication list for this problem includes:    Mavik 4 Mg Tabs (Trandolapril) .Marland Kitchen... 1 by mouth once daily    Lasix 20 Mg Tabs (Furosemide) .Marland Kitchen... 1 by mouth each am  BP today: 130/80 Prior BP: 126/84 (04/11/2009)  Labs Reviewed: K+: 4.5 (06/17/2009) Creat: : 1.5 (06/17/2009)   Chol: 191 (06/17/2009)   HDL: 33.50 (06/17/2009)   LDL: 123 (06/17/2009)   TG: 172.0 (06/17/2009)  Complete Medication List: 1)  Lopid 600 Mg Tabs (Gemfibrozil) .... Take one by mouth daily 2)  Mavik 4 Mg Tabs (Trandolapril) .Marland Kitchen.. 1 by mouth once daily 3)  Estrace 1 Mg Tabs (Estradiol) .... 1/2 tab by mouth once daily 4)  Calcium Lactate 750 Mg Tabs (Calcium lactate) .... Take two by mouth daily 5)  Tetracycline Hcl 500 Mg Caps (Tetracycline hcl) .... Take one by mouth daily 6)  Vitamin B-12 Cr  1000 Mcg Tbcr (Cyanocobalamin) .... One injection every 8 weeks 7)  Vesicare 10 Mg Tabs (Solifenacin succinate) .... Take one by mouth daily 8)  Vitamin D 1000 Iu  9)  Centrum Tabs (Multiple vitamins-minerals) .... Take 1 tablet by mouth once a day 10)  Lasix 20 Mg Tabs (Furosemide) .Marland Kitchen.. 1 by mouth each am 11)  Glucotrol Xl 10 Mg Xr24h-tab (Glipizide) .... Take 1 tablet by mouth once a day 12)  Cipro 250 Mg Tabs (Ciprofloxacin hcl) .Marland Kitchen.. 1 by mouth two times a day for 7 days 13)  Triamcinolone Acetonide 0.025 % Lotn (Triamcinolone acetonide) .... Apply to affected area once daily  Patient Instructions: 1)  please check your blood sugar two times a day - and send me a report in 2 weeks 2)  stop sweets and chips 3)  stick to diabetic diet  4)  try to get 30 minutes of exercise per day  5)  we will do allergist referral and also a renal referral at check out  6)  follow up with me in 1 month  7)  we will check urine test on way out   Prior Medications (reviewed today): LOPID 600 MG  TABS (GEMFIBROZIL) take one by mouth daily MAVIK 4 MG TABS (TRANDOLAPRIL) 1 by mouth once daily ESTRACE 1 MG TABS (ESTRADIOL) 1/2 tab by mouth once daily CALCIUM LACTATE 750 MG  TABS (CALCIUM LACTATE) take two by mouth daily TETRACYCLINE HCL 500 MG  CAPS (TETRACYCLINE  HCL) take one by mouth daily VITAMIN B-12 CR 1000 MCG  TBCR (CYANOCOBALAMIN) one injection every 8 weeks VESICARE 10 MG  TABS (SOLIFENACIN SUCCINATE) take one by mouth daily VITAMIN D 1000 IU ()  CENTRUM  TABS (MULTIPLE VITAMINS-MINERALS) Take 1 tablet by mouth once a day LASIX 20 MG TABS (FUROSEMIDE) 1 by mouth each am GLUCOTROL XL 10 MG XR24H-TAB (GLIPIZIDE) Take 1 tablet by mouth once a day CIPRO 250 MG TABS (CIPROFLOXACIN HCL) 1 by mouth two times a day for 7 days TRIAMCINOLONE ACETONIDE 0.025 % LOTN (TRIAMCINOLONE ACETONIDE) apply to affected area once daily Current Allergies: ! VOLTAREN  Laboratory Results   Urine Tests  Date/Time  Received: June 22, 2009 9:36 AM  Date/Time Reported: June 22, 2009 9:36 AM   Routine Urinalysis   Color: yellow Appearance: Clear Glucose: negative   (Normal Range: Negative) Bilirubin: negative   (Normal Range: Negative) Ketone: negative   (Normal Range: Negative) Spec. Gravity: 1.020   (Normal Range: 1.003-1.035) Blood: negative   (Normal Range: Negative) pH: 6.0   (Normal Range: 5.0-8.0) Protein: trace   (Normal Range: Negative) Urobilinogen: 0.2   (Normal Range: 0-1) Nitrite: negative   (Normal Range: Negative) Leukocyte Esterace: negative   (Normal Range: Negative)  Urine Microscopic WBC/HPF: 0 RBC/HPF: 0 Bacteria/HPF: 0 Mucous/HPF: few Epithelial/HPF: 1-3 Crystals/HPF: 0 Casts/LPF: 0 Yeast/HPF: 0 Other: 0

## 2010-09-19 NOTE — Assessment & Plan Note (Signed)
Summary: B-12 INJ/[facility]   Nurse Visit    Prior Medications: LOPID 600 MG  TABS (GEMFIBROZIL) take one by mouth daily MAVIK 2 MG  TABS (TRANDOLAPRIL) take one by mouth daily ESTRACE 0.1 MG/GM  CREA (ESTRADIOL) take one half by mouth daily CALCIUM LACTATE 750 MG  TABS (CALCIUM LACTATE) take two by mouth daily TETRACYCLINE HCL 500 MG  CAPS (TETRACYCLINE HCL) take one by mouth daily GLUCOTROL XL 5 MG  TB24 (GLIPIZIDE) take one by mouth daily VITAMIN B-12 CR 1000 MCG  TBCR (CYANOCOBALAMIN) one injection monthly VESICARE 10 MG  TABS (SOLIFENACIN SUCCINATE) take one by mouth daily     Medication Administration  Injection # 1:    Medication: Vit B12 1000 mcg    Diagnosis: B12 DEFICIENCY (ICD-266.2)    Route: IM    Site: L deltoid    Exp Date: 12/2008    Lot #: [id number]    Mfr: american regent    Patient tolerated injection without complications    Given by: Marty Heck (May 14, 2007 3:53 PM)  Orders Added: 1)  Vit B12 1000 mcg [J3420] 2)  Admin of Therapeutic Inj  intramuscular or subcutaneous Justinian.Creamer    ]  Medication Administration  Injection # 1:    Medication: Vit B12 1000 mcg    Diagnosis: B12 DEFICIENCY (ICD-266.2)    Route: IM    Site: L deltoid    Exp Date: 12/2008    Lot #: [id number]    Mfr: american regent    Patient tolerated injection without complications    Given by: Marty Heck (May 14, 2007 3:53 PM)  Orders Added: 1)  Vit B12 1000 mcg [J3420] 2)  Admin of Therapeutic Inj  intramuscular or subcutaneous [90772]

## 2010-09-19 NOTE — Assessment & Plan Note (Signed)
Summary: 4-6 WEEK F/U/REVIEW SUGAR & BP/BIR   Vital Signs:  Patient Profile:   77 Years Old Female Weight:      220 pounds Temp:     97.4 degrees F oral Pulse rate:   68 / minute Pulse rhythm:   regular BP sitting:   142 / 80  (left arm)  Vitals Entered By: [name] (September 08, 2008 8:29 AM)               Vision Comments: 10/2008   Serial Vital Signs/Assessments:  Time      Position  BP       Pulse  Resp  Temp     By                     138/80                         [name] MD   Chief Complaint:  4-6 wk f/u.  History of Present Illness: more stress/ 3 deaths in family including brother and cousins  thinks things will improve soon  stress causes her to eat poorly and not exercise  "medicates self with food "  sugars are running high in 160s- 180s (worse than last time) thinks she is eating too much and stays hungry all the time  no hypoglycemia  no numbness/ thirst   goes to PT for her exercise - [employer] twice weekly  it is a good workout  thinks she is getting stronger in general   does not want counseling for stress  thinks things wil get a lot better  disc coping tech- use of food is not helpful  last visit did increase her mavik at last visit  renal function stable  some imp in bp but not quite at goal  last opthy exam -- last march - in [place]  has a swelling under her eye  vision is not changed - no problems     Current Allergies (reviewed today): ! VOLTAREN  Past Medical History:    Reviewed history from 08/17/2008 and no changes required:       Diabetes mellitus, type II       Hypertension       Osteoarthritis       Hyperlipidemia       angioedema was poss from voltaren       vitamin b12 deficiency 4/08       LVH and atrial enlargement by echo in past with nl EF       degenerative disk dz                     ortho -- Dr Tonita Cong       GI - Dr Amedeo Plenty   Past Surgical History:    Reviewed history from 08/17/2008 and no  changes required:       Cataract extraction       Hysterectomy, BSO- fibroids       LVH-  EF 60%       Echo- bi-atrial enlargement, mild (05/1995)       Colonoscopy- no polyps (1995),   divertic., ? polyp (06/1998)  , diverticulosis (08/2003)       L4- L5 disk disease- epidural inj. 12/1998       Dexa- osteopenia (04/2002) , stable (10/20050       Knee surgery- arthroscope       spinal decomression sx 8/09       (  12/09) colonoscopy polyp/ adenomatous - re check 5 y   Family History:    Reviewed history from 03/02/2008 and no changes required:       sister CAD, brain tumor with hemorrhage   Social History:    Reviewed history from 03/02/2008 and no changes required:       husband is ill- she cares for him       non smoker        no alcohol        works a lot outside in [place] of fatigue.      Denies loss of appetite, malaise, and weight loss.  Eyes      Denies blurring and eye pain.  CV      Complains of swelling of feet.      Denies chest pain or discomfort, palpitations, and shortness of breath with exertion.  Resp      Denies cough and wheezing.  GI      Denies abdominal pain, bloody stools, and change in bowel habits.  MS      Complains of joint pain and stiffness.      Denies joint redness and joint swelling.  Derm      Denies itching and rash.  Neuro      Denies numbness and tingling.  Psych      Complains of depression.  Endo      Denies cold intolerance, excessive thirst, and excessive urination.   Physical Exam  General:     overweight but generally well appearing  Head:     normocephalic, atraumatic, and no abnormalities observed.   Eyes:     vision grossly intact, pupils equal, pupils round, and pupils reactive to light.  no conjunctival pallor, injection or icterus  Mouth:     pharynx pink and moist.   Neck:     supple with full rom and no masses or thyromegally, no JVD or carotid bruit   Chest Wall:     No deformities, masses, or tenderness noted. Lungs:     Normal respiratory effort, chest expands symmetrically. Lungs are clear to auscultation, no crackles or wheezes. Heart:     Normal rate and regular rhythm. S1 and S2 normal without gallop, murmur, click, rub or other extra sounds. Abdomen:     Bowel sounds positive,abdomen soft and non-tender without masses, organomegaly or hernias  no renal bruits  Msk:     No deformity or scoliosis noted of thoracic or lumbar spine.   Pulses:     R and L carotid,radial,femoral,dorsalis pedis and posterior tibial pulses are full and equal bilaterally Extremities:     No clubbing, cyanosis, edema, or deformity noted with normal full range of motion of all joints.   Neurologic:     sensation intact to light touch, gait normal, and DTRs symmetrical and normal.   Skin:     Intact without suspicious lesions or rashes Cervical Nodes:     No lymphadenopathy noted Psych:     normal affect, talkative and pleasant   Diabetes Management Exam:    Foot Exam (with socks and/or shoes not present):       Sensory-Pinprick/Light touch:          Left medial foot (L-4): normal          Left dorsal foot (L-5): normal          Left lateral foot (S-1): normal  Right medial foot (L-4): normal          Right dorsal foot (L-5): normal          Right lateral foot (S-1): normal       Sensory-Monofilament:          Left foot: normal          Right foot: normal       Inspection:          Left foot: normal          Right foot: normal       Nails:          Left foot: normal          Right foot: normal    Eye Exam:       Eye Exam done elsewhere          Date: 10/19/2007          Results: normal          Done by: Lady Gary    Impression & Recommendations:  Problem # 1:  HYPERTENSION (ICD-401.9) Assessment: Improved improved- but still borderline with inc mavik (and stable renal fxn) adv to keep up exercise and avoid salt labs  then f/u in 2 mo Her updated medication list for this problem includes:    Mavik 4 Mg Tabs (Trandolapril) .Marland Kitchen... 1 by mouth once daily  BP today: 142/80 Prior BP: 154/80 (08/03/2008)  Labs Reviewed: Creat: 1.2 (08/17/2008) Chol: 166 (01/26/2008)   HDL: 28.2 (01/26/2008)   LDL: 117 (01/26/2008)   TG: 103 (01/26/2008)   Problem # 2:  DIABETES MELLITUS, TYPE II (ICD-250.00) Assessment: Deteriorated overall worse due to poor diet (emotional overeating) pt refuses counseling at this time  disc coping tech in detail - urged to exercise or do crafts instead of eating when she feels stressed no med changes- but if not imp in 2 mo- need to consider inc actos ref for opthy exam Her updated medication list for this problem includes:    Mavik 4 Mg Tabs (Trandolapril) .Marland Kitchen... 1 by mouth once daily    Glucotrol Xl 10 Mg Tb24 (Glipizide) .Marland Kitchen... Take 1 tablet by mouth once a day    Actos 15 Mg Tabs (Pioglitazone hcl) .Marland Kitchen... 1 by mouth each am  Labs Reviewed: HgBA1c: 7.1 (06/29/2008)   Creat: 1.2 (08/17/2008)   Microalbumin: 0.2 (10/18/2006)  Orders: Radiology Referral (Radiology)   Problem # 3:  RENAL INSUFFICIENCY (ICD-588.9) Assessment: Unchanged stable on inc mavik microalb planned with labs in 2 mo suspect multifactorial  Problem # 4:  STRESS REACTION, ACUTE, WITH EMOTIONAL DISTURBANCE (ICD-308.0) Assessment: Comment Only disc this in detail including coping techniques  counseling offered to pt - which I think would help  Complete Medication List: 1)  Lopid 600 Mg Tabs (Gemfibrozil) .... Take one by mouth daily 2)  Mavik 4 Mg Tabs (Trandolapril) .Marland Kitchen.. 1 by mouth once daily 3)  Estrace 0.1 Mg/gm Crea (Estradiol) .... Take one half by mouth daily 4)  Calcium Lactate 750 Mg Tabs (Calcium lactate) .... Take two by mouth daily 5)  Tetracycline Hcl 500 Mg Caps (Tetracycline hcl) .... Take one by mouth daily 6)  Vitamin B-12 Cr 1000 Mcg Tbcr (Cyanocobalamin) .... One injection every 8 weeks  7)  Vesicare 10 Mg Tabs (Solifenacin succinate) .... Take one by mouth daily 8)  Vitamin D 1000 Iu  9)  Glucotrol Xl 10 Mg Tb24 (Glipizide) .... Take 1 tablet by mouth once a day 10)  Actos  15 Mg Tabs (Pioglitazone hcl) .Marland Kitchen.. 1 by mouth each am   Patient Instructions: 1)  we will schedule your yearly eye exam at check out  2)  start following diabetic diet- cut portions  3)  start exercising 5 days per week - indoors or outdoors 4)  no change in medications  5)  schedule labs in 2 months with [facility] and microalbumin and renal panel 250.0, 401.1 and then follow up

## 2010-09-19 NOTE — Consult Note (Signed)
Summary: [facility]/Dr. Hyman Bower   Imported By: Lanice Shirts 02/24/2008 09:23:57  _____________________________________________________________________  External Attachment:    Type:   Image     Comment:   External Document

## 2010-09-19 NOTE — Assessment & Plan Note (Signed)
Summary: KIDNEY PROBLEM/CLE  Medications Added VITAMIN B-12 CR 1000 MCG  TBCR (CYANOCOBALAMIN) one injection every 8 weeks        Vital Signs:  Patient Profile:   77 Years Old Female Weight:      207 pounds Temp:     97.4 degrees F oral Pulse rate:   64 / minute Pulse rhythm:   regular BP sitting:   160 / 80  (left arm) Cuff size:   large  Vitals Entered By: Marty Heck (July 14, 2007 12:14 PM)                 Serial Vital Signs/Assessments:  Time      Position  BP       Pulse  Resp  Temp     By                     130/80                         [name] MD   Chief Complaint:  Dark color to urine a few times.  History of Present Illness: friday she urinated- and urine was darker than usual- ? blood next void was nl sat am was dark again ? if low fluid intake- not a big water drinker- usually milk or diet drink does not think she gets 8 glasses of fluid daily did increase fluid this weekend- and urine was ok this am  no burning or new frequency no new meds or supplements        Review of Systems      See HPI  General      Denies chills, fatigue, and fever.  GI      Denies loss of appetite, nausea, and vomiting.  GU      Denies dysuria, urinary frequency, and urinary hesitancy.  MS      Complains of low back pain.      back pain from deg disk- doing PT and stretch-doing better  Derm      Denies changes in color of skin and rash.   Physical Exam  General:     overweight but generally well appearing  Head:     normocephalic, atraumatic, no abnormalities observed, and no abnormalities palpated.   Eyes:     vision grossly intact, pupils equal, and pupils round.   Mouth:     MMM Neck:     No deformities, masses, or tenderness noted. Lungs:     Normal respiratory effort, chest expands symmetrically. Lungs are clear to auscultation, no crackles or wheezes. Heart:     Normal rate and regular rhythm. S1 and S2 normal without  gallop, murmur, click, rub or other extra sounds. Abdomen:     soft and nt no suprapubic tenderness or fullness no hepatomegaly and no splenomegaly.   Msk:     no cva tenderness Pulses:     R and L carotid,radial,femoral,dorsalis pedis and posterior tibial pulses are full and equal bilaterally Skin:     Intact without suspicious lesions or rashes Cervical Nodes:     No lymphadenopathy noted Psych:     nl affect    Impression & Recommendations:  Problem # 1:  URINALYSIS, ABNORMAL (ICD-791.9) suspect change in urine color over the weekend was in resp to less fluid reviewed healthy need for fluid intake in detail will update if any problems few wbc on micro- will also cx and  update Orders: UA Dipstick W/ Micro (81000) T-Culture, Urine [id number])   Problem # 2:  B12 DEFICIENCY (ICD-266.2) can get shots q 2 mo now has been doing well with some more energy  Complete Medication List: 1)  Lopid 600 Mg Tabs (Gemfibrozil) .... Take one by mouth daily 2)  Mavik 2 Mg Tabs (Trandolapril) .... Take one by mouth daily 3)  Estrace 0.1 Mg/gm Crea (Estradiol) .... Take one half by mouth daily 4)  Calcium Lactate 750 Mg Tabs (Calcium lactate) .... Take two by mouth daily 5)  Tetracycline Hcl 500 Mg Caps (Tetracycline hcl) .... Take one by mouth daily 6)  Glucotrol Xl 5 Mg Tb24 (Glipizide) .... Take one by mouth daily 7)  Vitamin B-12 Cr 1000 Mcg Tbcr (Cyanocobalamin) .... One injection every 8 weeks 8)  Vesicare 10 Mg Tabs (Solifenacin succinate) .... Take one by mouth daily 9)  Anusol-hc 25 Mg Supp (Hydrocortisone acetate) .Marland Kitchen.. 1 pr at bedtime for 7-14 days   Patient Instructions: 1)  your urine color is back to normal now 2)  start trying to get 8-10 glasses of fluids daily (preferably water) 3)  if urine changes color again- let me know 4)  there were a few white blood cells in urine- so I will do a culture and make sure there is no infection- and let you know 5)  if you get  burining to urinate, or frequency or pain please call 6)  we can continue B12 shots every 2 months    ] Laboratory Results   Urine Tests  Date/Time Recieved: July 14, 2007 12:26 PM  Date/Time Reported: July 14, 2007 12:26 PM   Routine Urinalysis   Color: yellow Appearance: Hazy Glucose: negative   (Normal Range: Negative) Bilirubin: negative   (Normal Range: Negative) Ketone: negative   (Normal Range: Negative) Spec. Gravity: 1.010   (Normal Range: 1.003-1.035) Blood: negative   (Normal Range: Negative) pH: 6.5   (Normal Range: 5.0-8.0) Protein: negative   (Normal Range: Negative) Urobilinogen: 0.2   (Normal Range: 0-1) Nitrite: negative   (Normal Range: Negative) Leukocyte Esterace: moderate   (Normal Range: Negative)  Urine Microscopic WBC/hpf: 1-3 RBC/hpf: 0 Bacteria: few Mucous: few Epithelial: many Crystals/LPF: 0 Casts/LPF: 0 Yeast/HPF: 0

## 2010-09-19 NOTE — Progress Notes (Signed)
Summary: wants to leave urine specimen  Phone Note Call from Patient Call back at Home Phone [phone]   Caller: Patient Call For: [name] MD Summary of Call: Pt is complaining of back pain, thinks she has a UTI.  She is asking if she can come in and leave urine specimen. Initial call taken by: Marty Heck CMA,  March 27, 2010 10:27 AM  Follow-up for Phone Call        let me know if other (urinary symptoms) leave specimen please Follow-up by: [name] MD,  March 27, 2010 12:01 PM  Additional Follow-up for Phone Call Additional follow up Details #1::        Pt said she is not having any other symptoms besides low back pain. Pt will do nurse visit today at 2pm for u/a. Pt use [facility] if pharmacy is needed.Ozzie Hoyle LPN  August  8, 624THL 1:18 PM

## 2010-09-19 NOTE — Consult Note (Signed)
Summary: [facility]  [facility]   Imported By: Edmonia James 08/22/2009 11:32:58  _____________________________________________________________________  External Attachment:    Type:   Image     Comment:   External Document

## 2010-09-19 NOTE — Progress Notes (Signed)
Summary: [facility]   Imported By: Lanice Shirts 07/01/2007 12:19:28  _____________________________________________________________________  External Attachment:    Type:   Image     Comment:   External Document

## 2010-09-19 NOTE — Assessment & Plan Note (Signed)
Summary: ROA FOR 3 MONTH FOLLOW-UP/JRR   Vital Signs:  Patient profile:   77 year old female Height:      63 inches Weight:      213 pounds BMI:     37.87 Temp:     97.5 degrees F oral Pulse rate:   80 / minute Pulse rhythm:   regular BP sitting:   120 / 76  (left arm) Cuff size:   large  Vitals Entered By: Ozzie Hoyle LPN (October  4, 624THL 8:18 AM) CC: three month f/u   History of Present Illness: here for f/u of DM and HTN and lipids and renal insuff  wt is stable with bmi of 37  sees renal for renal insuff with recent Cr of 1.5  DM is better with AIC 6.8 today on lantus (down from the mid 8s) sugars are running from 70s to low 100s fasting , after meals 150s  diet is good  not a lot of exercise -- problems taking care of her husband and he is in a nursing home (he has severe depression and anxiety) -- with weakness  stress is high  opthy is up to date   lipids show good trig control at 138/ slt inc LDL at 123 does avoid fatty foods   B12 status   flu shot - got one today   Allergies: 1)  ! Voltaren 2)  ! * Actos  Past History:  Past Medical History: Last updated: 08/08/2009 Diabetes mellitus, type II Hypertension Osteoarthritis Hyperlipidemia angioedema was poss from voltaren vitamin b12 deficiency 4/08 LVH and atrial enlargement by echo in past with nl EF degenerative disk dz osteopenia  pruritis  renal insufficiency  ortho -- Dr Tonita Cong GI - Dr Amedeo Plenty   Past Surgical History: Last updated: 12/20/2008 Cataract extraction Hysterectomy, BSO- fibroids LVH-  EF 60% Echo- bi-atrial enlargement, mild (05/1995) Colonoscopy- no polyps (1995),   divertic., ? polyp (06/1998)  , diverticulosis (08/2003) L4- L5 disk disease- epidural inj. 12/1998 Dexa- osteopenia (04/2002) , stable (10/20050 Knee surgery- arthroscope spinal decomression sx 8/09 (12/09) colonoscopy polyp/ adenomatous - re check 5 y  (flex sig done 4/10 was nl -- so rec 5 y f/u) 4/10 dexa  stable osteopenia   Family History: Last updated: 02/17/2010 sister CAD, brain tumor with hemorrhage  brother died of copd   Social History: Last updated: 05/23/2010 husband is ill- she cares for him  (he has severe anx and depression in and out of nsg homes) non smoker  no alcohol  works a lot outside in garden  Risk Factors: Smoking Status: never (11/27/2006)  Social History: husband is ill- she cares for him  (he has severe anx and depression in and out of nsg homes) non smoker  no alcohol  works a lot outside in garden  Review of Systems General:  Complains of fatigue; denies fever, loss of appetite, and malaise. Eyes:  Denies blurring and eye pain. CV:  Denies chest pain or discomfort, palpitations, and shortness of breath with exertion. Resp:  Denies cough and wheezing. GI:  Denies abdominal pain, change in bowel habits, and indigestion. GU:  Denies dysuria and urinary frequency. MS:  Denies muscle aches and cramps. Derm:  Denies itching, lesion(s), poor wound healing, and rash. Neuro:  Denies numbness and tingling. Psych:  mood is the same . Endo:  Denies excessive thirst and excessive urination. Heme:  Denies abnormal bruising and bleeding.  Physical Exam  General:  overweight but generally well appearing  Head:  normocephalic, atraumatic, and no abnormalities observed.   Mouth:  pharynx pink and moist.   Neck:  supple with full rom and no masses or thyromegally, no JVD or carotid bruit  Chest Wall:  No deformities, masses, or tenderness noted. Lungs:  Normal respiratory effort, chest expands symmetrically. Lungs are clear to auscultation, no crackles or wheezes. Heart:  Normal rate and regular rhythm. S1 and S2 normal without gallop, murmur, click, rub or other extra sounds. Abdomen:  Bowel sounds positive,abdomen soft and non-tender without masses, organomegaly or hernias noted. no renal bruits  Msk:  No deformity or scoliosis noted of thoracic or lumbar  spine.  no acute joint changes  Pulses:  R and L carotid,radial,femoral,dorsalis pedis and posterior tibial pulses are full and equal bilaterally Extremities:  No clubbing, cyanosis, edema, or deformity noted with normal full range of motion of all joints.   Neurologic:  sensation intact to light touch, gait normal, and DTRs symmetrical and normal.   Skin:  Intact without suspicious lesions or rashes Cervical Nodes:  No lymphadenopathy noted Psych:  normal affect, talkative and pleasant   Diabetes Management Exam:    Foot Exam (with socks and/or shoes not present):       Sensory-Pinprick/Light touch:          Left medial foot (L-4): normal          Left dorsal foot (L-5): normal          Left lateral foot (S-1): normal          Right medial foot (L-4): normal          Right dorsal foot (L-5): normal          Right lateral foot (S-1): normal       Sensory-Monofilament:          Left foot: normal          Right foot: normal       Inspection:          Left foot: normal          Right foot: normal       Nails:          Left foot: normal          Right foot: normal   Impression & Recommendations:  Problem # 1:  HYPERLIPIDEMIA (ICD-272.4) Assessment Deteriorated  trig in good control but LDL up a bit  disc goals for this and risks  rev low sat fat diet  re check 3 mo and f/u Her updated medication list for this problem includes:    Lopid 600 Mg Tabs (Gemfibrozil) .Marland Kitchen... Take one by mouth daily  Labs Reviewed: SGOT: 22 (05/16/2010)   SGPT: 21 (05/16/2010)   HDL:34.00 (05/16/2010), 36.60 (11/07/2009)  LDL:123 (05/16/2010), 114 (11/07/2009)  Chol:185 (05/16/2010), 178 (11/07/2009)  Trig:138.0 (05/16/2010), 137.0 (11/07/2009)  Problem # 2:  HYPERTENSION (ICD-401.9) Assessment: Unchanged  in good control with current med incl ace- no change Her updated medication list for this problem includes:    Mavik 4 Mg Tabs (Trandolapril) .Marland Kitchen... 1 by mouth once daily    Amlodipine Besylate  10 Mg Tabs (Amlodipine besylate) .Marland Kitchen... Take 1 tablet by mouth once a day    Hydralazine Hcl 25 Mg Tabs (Hydralazine hcl) .Marland Kitchen... Take one tablet by mouth three times a day  BP today: 120/76 Prior BP: 128/76 (02/17/2010)  Labs Reviewed: K+: 4.4 (05/16/2010) Creat: : 1.5 (05/16/2010)   Chol: 185 (05/16/2010)   HDL: 34.00 (05/16/2010)  LDL: 123 (05/16/2010)   TG: 138.0 (05/16/2010)  Problem # 3:  DIABETES MELLITUS, TYPE II (ICD-250.00) Assessment: Improved  much imp with low dose lantus and diet commended on this  re chekc 3 mo and f/u  enc exercise when she can Her updated medication list for this problem includes:    Mavik 4 Mg Tabs (Trandolapril) .Marland Kitchen... 1 by mouth once daily    Glucotrol Xl 10 Mg Xr24h-tab (Glipizide) .Marland Kitchen... Take 1 tablet by mouth once a day    Lantus Solostar 100 Unit/ml Soln (Insulin glargine) ..... Inject 10 units once a day or as directed  Labs Reviewed: Creat: 1.5 (05/16/2010)     Last Eye Exam: diabetic retinopathy (12/08/2009) Reviewed HgBA1c results: 6.8 (05/16/2010)  8.5 (02/14/2010)  Problem # 4:  B12 DEFICIENCY (ICD-266.2) Assessment: Unchanged shot today on schedule  Problem # 5:  RENAL INSUFFICIENCY (ICD-588.9) Assessment: Comment Only  continue renal f/u and bp control  stable cr 1.5  Complete Medication List: 1)  Lopid 600 Mg Tabs (Gemfibrozil) .... Take one by mouth daily 2)  Mavik 4 Mg Tabs (Trandolapril) .Marland Kitchen.. 1 by mouth once daily 3)  Calcium Lactate 750 Mg Tabs (Calcium lactate) .... Take one  by mouth daily 4)  Tetracycline Hcl 500 Mg Caps (Tetracycline hcl) .... Take one by mouth daily 5)  Vitamin B-12 Cr 1000 Mcg Tbcr (Cyanocobalamin) .... One injection every 8 weeks 6)  Vesicare 10 Mg Tabs (Solifenacin succinate) .... Take one by mouth daily 7)  Vitamin D 1000 Iu  .... One daily 8)  Centrum Tabs (Multiple vitamins-minerals) .... Take 1 tablet by mouth once a day 9)  Glucotrol Xl 10 Mg Xr24h-tab (Glipizide) .... Take 1 tablet by  mouth once a day 10)  Triamcinolone Acetonide 0.025 % Lotn (Triamcinolone acetonide) .... Apply to affected area once daily as needed 11)  Benadryl 25 Mg Tabs (Diphenhydramine hcl) .... Otc as directed. 12)  Amlodipine Besylate 10 Mg Tabs (Amlodipine besylate) .... Take 1 tablet by mouth once a day 13)  Hydralazine Hcl 25 Mg Tabs (Hydralazine hcl) .... Take one tablet by mouth three times a day 14)  Lantus Solostar 100 Unit/ml Soln (Insulin glargine) .... Inject 10 units once a day or as directed 15)  Pin Needles For Lantus Solostar  .... To inject once daily as directed  Other Orders: Vit B12 1000 mcg (J3420) Admin of Therapeutic Inj  intramuscular or subcutaneous [id number]) Flu Vaccine 56yrs + MEDICARE PATIENTS [id number]) Administration Flu vaccine - MCR [id number])  Patient Instructions: 1)  blood pressure is good 2)  weight is stable 3)  B12 shot today  4)  sugar is better  5)  work on low cholesterol diet  6)  schedule fasting lab in 3 months and then follow up  7)  renal / AIC / lipid/ast//alt 272, 250.0  8)  no changes in medicine today  Current Allergies (reviewed today): ! VOLTAREN ! * ACTOS   Medication Administration  Injection # 1:    Medication: Vit B12 1000 mcg    Diagnosis: B12 DEFICIENCY (ICD-266.2)    Route: IM    Site: R deltoid    Exp Date: 11/19/2011    Lot #: [id number]    Mfr: [employer]    Patient tolerated injection without complications    Given by: Ozzie Hoyle LPN (October  4, 624THL 8:48 AM)  Orders Added: 1)  Vit B12 1000 mcg [J3420] 2)  Admin of Therapeutic Inj  intramuscular or subcutaneous [[id number]] 3)  Flu Vaccine 4yrs + MEDICARE PATIENTS [Q2039] 4)  Administration Flu vaccine - MCR [id number] 5)  Est. Patient Level IV [id number]        Flu Vaccine Consent Questions     Do you have a history of severe allergic reactions to this vaccine? no    Any prior history of allergic reactions to egg and/or gelatin? no    Do you have a sensitivity to the  preservative Thimersol? no    Do you have a past history of Guillan-Barre Syndrome? no    Do you currently have an acute febrile illness? no    Have you ever had a severe reaction to latex? no    Vaccine information given and explained to patient? yes    Are you currently pregnant? no    Lot Number:[id number]   Exp Date:02/17/2011   Site Given  Left Deltoid IM.lbmedflu Ozzie Hoyle LPN  October  4, 624THL 8:48 AM

## 2010-09-19 NOTE — Consult Note (Signed)
Summary: [facility]/Diabetic Evaluation Report/Dr. [facility]/Diabetic Evaluation Report/Dr. Einar Gip   Imported By: Genice Rouge 12/05/2007 10:34:43  _____________________________________________________________________  External Attachment:    Type:   Image     Comment:   External Document

## 2010-09-19 NOTE — Medication Information (Signed)
Summary: [facility] Diabetic Supplies  Diabetes Supplies/Medical & Diabetic Supplies   Imported By: Edmonia James 11/03/2009 13:44:37  _____________________________________________________________________  External Attachment:    Type:   Image     Comment:   External Document

## 2010-09-19 NOTE — Consult Note (Signed)
Summary: [facility]   Imported By: Edmonia James 12/21/2009 11:33:35  _____________________________________________________________________  External Attachment:    Type:   Image     Comment:   External Document

## 2010-09-19 NOTE — Consult Note (Signed)
Summary: [facility] / DR. Susa Day  [facility] / DR. Susa Day   Imported By: Pilar Grammes 05/13/2008 15:02:18  _____________________________________________________________________  External Attachment:    Type:   Image     Comment:   External Document

## 2010-09-19 NOTE — Consult Note (Signed)
Summary: [facility]   Imported By: Edmonia James 01/18/2010 10:03:25  _____________________________________________________________________  External Attachment:    Type:   Image     Comment:   External Document

## 2010-09-19 NOTE — Miscellaneous (Signed)
Summary: medication update from hospital stay  Clinical Lists Changes  Medications: Removed medication of CALCIUM LACTATE 750 MG  TABS (CALCIUM LACTATE) take one  by mouth daily Removed medication of TRIAMCINOLONE ACETONIDE 0.025 % LOTN (TRIAMCINOLONE ACETONIDE) apply to affected area once daily as needed Removed medication of BENADRYL 25 MG TABS (DIPHENHYDRAMINE HCL) OTC As directed. Added new medication of METOPROLOL SUCCINATE 25 MG XR24H-TAB (METOPROLOL SUCCINATE) 1/2 tablet twice daily Added new medication of OSCAL 500/200 D-3 500-200 MG-UNIT TABS (CALCIUM CARBONATE-VITAMIN D) one tablet daily Added new medication of ZOLOFT 50 MG TABS (SERTRALINE HCL) once daily Added new medication of GLUCOTROL 10 MG TABS (GLIPIZIDE) one tablet daily Added new medication of ASPIRIN 325 MG TABS (ASPIRIN) one tablet daily Added new medication of ALPRAZOLAM 0.25 MG TABS (ALPRAZOLAM) one tablet every 8 hours as needed Observations: Added new observation of MEDS REVIEW: Done (07/05/2010 14:54) Added new observation of ALLERGY REV: Done (07/05/2010 14:54)     Current Allergies (reviewed today): ! VOLTAREN ! * ACTOS

## 2010-09-19 NOTE — Assessment & Plan Note (Signed)
Summary: 1 M F/U   Vital Signs:  Patient Profile:   77 Years Old Female Weight:      216 pounds Temp:     97.5 degrees F oral Pulse rate:   56 / minute Pulse rhythm:   regular BP sitting:   150 / 80  (left arm) Cuff size:   large  Vitals Entered By: [name] (June 29, 2008 10:00 AM)                 Chief Complaint:  1 mo f/u.  History of Present Illness: goal with last visit was to loose 3 lb has been unable to loose it  is trying to cut back eating -- but " not enough"  still eating too much bread  has cut back on desserts -- occ a piece of candy -- 2-3 days per week- knows she can stop that  is eating more slaw -cooked  has not had any fried food  thinks her portions are ok   very stressful in family lately brother in nursing home- almost died, and sister sick also  she has to help take care of them-- so unable to fix her meals  is walking more -- is gradually getting her stamina back since her back surgery at least 20-25 minutes daily , still walking pretty slow still some problems getting up and down   DM -- sugars run usually 160-180 does not check in afternoon- not enough strips-- but just got them   HTN-- bp is up today has not been checking her bp at home    Current Allergies (reviewed today): ! VOLTAREN  Past Medical History:    Reviewed history from 04/28/2008 and no changes required:       Diabetes mellitus, type II       Hypertension       Osteoarthritis       Hyperlipidemia       angioedema was poss from voltaren       vitamin b12 deficiency 4/08       LVH and atrial enlargement by echo in past with nl EF                     ortho -- Dr Tonita Cong  Past Surgical History:    Reviewed history from 04/28/2008 and no changes required:       Cataract extraction       Hysterectomy, BSO- fibroids       LVH-  EF 60%       Echo- bi-atrial enlargement, mild (05/1995)       Colonoscopy- no polyps (1995),   divertic., ? polyp (06/1998)  ,  diverticulosis (08/2003)       L4- L5 disk disease- epidural inj. 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      Denies fever, loss of appetite, and malaise.  Eyes      Denies blurring and eye pain.  CV      Denies chest pain or discomfort, lightheadness, palpitations, shortness of breath with exertion, and swelling of feet.  Resp      Denies chest pain with inspiration, cough, shortness of breath, and wheezing.  GI      Denies abdominal pain and change in bowel habits.  GU      Denies urinary frequency.  MS      Complains of low back pain.  Derm      Denies lesion(s) and rash.  Neuro      Denies numbness and tingling.  Psych      is overall very stressed- but does not think she needs to talk to anyone professionally fairly good support   Endo      Denies excessive thirst and excessive urination.  Heme      Denies abnormal bruising.   Physical Exam  General:     overweight but generally well appearing  Head:     normocephalic, atraumatic, and no abnormalities observed.   Eyes:     vision grossly intact, pupils equal, pupils round, and pupils reactive to light.   Mouth:     pharynx pink and moist.   Neck:     supple with full rom and no masses or thyromegally, no JVD or carotid bruit  Lungs:     Normal respiratory effort, chest expands symmetrically. Lungs are clear to auscultation, no crackles or wheezes. Heart:     Normal rate and regular rhythm. S1 and S2 normal without gallop, murmur,  click, rub or other extra sounds. Abdomen:     soft and non-tender.   Pulses:     R and L carotid,radial,femoral,dorsalis pedis and posterior tibial pulses are full and equal bilaterally Extremities:     some varicosities  no edema today Neurologic:     sensation intact to light touch and gait normal.   Skin:     Intact without suspicious lesions or rashes Cervical Nodes:     No lymphadenopathy noted Psych:     seems somewhat stressed and fatigued today    Impression & Recommendations:  Problem # 1:  HYPERTENSION (ICD-401.9) Assessment: Deteriorated bp is up today- pt wonders if it could be stress related urged her to watch closely at home and keep working on exercise reviewed last labs  reviewed plan for low salt diet/exercise/wt loss  f/u 4-6 weeks  Her updated medication list for this problem includes:    Mavik 2 Mg Tabs (Trandolapril) .Marland Kitchen... Take one by mouth daily  BP today: 150/80 Prior BP: 122/80 (05/28/2008)  Labs Reviewed: Creat: 1.3 (01/26/2008) Chol: 166 (01/26/2008)   HDL: 28.2 (01/26/2008)   LDL: 117 (01/26/2008)   TG: 103 (01/26/2008)  Orders: Venipuncture [id number]) TLB-Renal Function Panel (80069-RENAL) TLB-Hepatic/Liver Function Pnl (80076-HEPATIC) TLB-A1C / Hgb A1C (Glycohemoglobin) (83036-A1C)   Problem # 2:  DIABETES MELLITUS, TYPE II (ICD-250.00) Assessment: Unchanged not optimally controlled  will not try metformin in light of renal insufficiency  again - disc diet/sugar intake and need for wt loss add actos - and carefully monitor hepatic fxn and for any CHF symptoms (ie: swelling or sob) f/u 4-6 weeks  adv to check sugars two times a day  Her updated medication list for this problem includes:    Mavik 2 Mg Tabs (Trandolapril) .Marland Kitchen... Take one by mouth  daily    Glucotrol Xl 10 Mg Tb24 (Glipizide) .Marland Kitchen... Take 1 tablet by mouth once a day    Actos 15 Mg Tabs (Pioglitazone hcl) .Marland Kitchen... 1 by mouth each am  Labs Reviewed: HgBA1c: 6.5 (01/26/2008)    Creat: 1.3 (01/26/2008)   Microalbumin: 0.2 (10/18/2006)  Orders: Venipuncture [id number]) TLB-Renal Function Panel (80069-RENAL) TLB-Hepatic/Liver Function Pnl (80076-HEPATIC) TLB-A1C / Hgb A1C (Glycohemoglobin) (83036-A1C)   Problem # 3:  HYPERLIPIDEMIA (ICD-272.4) Assessment: Unchanged fairly controlled with lopid and diet trig would be further helped by better sugar control  Her updated medication list for this problem includes:    Lopid 600 Mg Tabs (Gemfibrozil) .Marland Kitchen... Take one by mouth daily  Labs Reviewed: Chol: 166 (01/26/2008)   HDL: 28.2 (01/26/2008)   LDL: 117 (01/26/2008)   TG: 103 (01/26/2008) SGOT: 14 (01/26/2008)   SGPT: 14 (01/26/2008)   Problem # 4:  RENAL INSUFFICIENCY (ICD-588.9) Assessment: Unchanged likely multifactorial with DM and HTN n microalb in 2/09 watching cr- baseline 1.3 will try to avoid nephrotoxic meds aim for good blood pressure and sugar control   Problem # 5:  STRESS REACTION, ACUTE, WITH EMOTIONAL DISTURBANCE (ICD-308.0) Assessment: New stress over family issues  disc coping tech and support systems  overall stress level makes her unable to care for herself the way that she wants  offered counseling referral - pt does not want this yet   Complete Medication List: 1)  Lopid 600 Mg Tabs (Gemfibrozil) .... Take one by mouth daily 2)  Mavik 2 Mg Tabs (Trandolapril) .... Take one by mouth daily 3)  Estrace 0.1 Mg/gm Crea (Estradiol) .... Take one half by mouth daily 4)  Calcium Lactate 750 Mg Tabs (Calcium lactate) .... Take two by mouth daily 5)  Tetracycline Hcl 500 Mg Caps (Tetracycline hcl) .... Take one by mouth daily 6)  Vitamin B-12 Cr 1000 Mcg Tbcr (Cyanocobalamin) .... One injection every 8 weeks 7)  Vesicare 10 Mg Tabs (Solifenacin succinate) .... Take one by mouth daily 8)  Vitamin D 1000 Iu  9)  Glucotrol Xl 10 Mg Tb24 (Glipizide) .... Take 1 tablet by mouth once a day 10)  Methocarbamol 500 Mg Tabs (Methocarbamol) .... Take one by  mouth at bedtime 11)  Actos 15 Mg Tabs (Pioglitazone hcl) .Marland Kitchen.. 1 by mouth each am   Patient Instructions: 1)  start actos 15 mg 1 pill each am  2)  update me if low sugar , or if any swelling in your legs or if shortness of breath  3)  really try to watch diet - portion sizes , and cut out the bread and sweets  4)  keep up the good exercise  5)  start watching blood pressure at home  6)  labs today 7)  follow up in 4-6 weeks with your blood sugar and blood pressure log/numbers    Prescriptions: ACTOS 15 MG TABS (PIOGLITAZONE HCL) 1 by mouth each am  #30 x 3   Entered and Authorized by:   [name] MD   Signed by:   [name] MD on 06/29/2008   Method used:   Print then Give to Patient   RxID:   [id number]  ]

## 2010-09-19 NOTE — Assessment & Plan Note (Signed)
Summary: 6 MONTH FOLLOW UP/[facility]  Medications Added * VITAMIN D 1000 IU         Vital Signs:  Patient Profile:   77 Years Old Female Weight:      205 pounds Temp:     97.5 degrees F oral Pulse rate:   72 / minute Pulse rhythm:   regular BP sitting:   138 / 78  (left arm) Cuff size:   large  Vitals Entered By: Marty Heck (September 08, 2007 8:12 AM)                 Chief Complaint:  6 month follow up.  History of Present Illness: has been under a lot of stress- husband in the hospital had uti and PE- is doing a little better she is getting by- staying at the hospital  overall physically ok wt down 2 lb , and bp is stable no exercise lately no urinary symptoms   blood sugars have been really good (90s in the am) needs B12 shot today too        Review of Systems      See HPI  General      Complains of fatigue.  Eyes      Denies blurring.      has to go to eye doctor soon (probs with glasses)- no change in vision  ENT      some phlegm in her throat this am  CV      Denies chest pain or discomfort.  Derm      Denies rash.  Neuro      Denies numbness.  Endo      Denies excessive thirst and excessive urination.   Physical Exam  General:     overweight but generally well appearing  Head:     normocephalic, atraumatic, and no abnormalities observed.   Eyes:     vision grossly intact, pupils equal, pupils round, pupils reactive to light, and no injection.   Ears:     R ear normal and L ear normal.   Nose:     nasal dischargemucosal pallor.  nares are boggy Mouth:     pharynx pink and moist.   Neck:     No deformities, masses, or tenderness noted. Lungs:     Normal respiratory effort, chest expands symmetrically. Lungs are clear to auscultation, no crackles or wheezes. Heart:     Normal rate and regular rhythm. S1 and S2 normal without gallop, murmur, click, rub or other extra sounds. Msk:     No deformity or scoliosis noted of  thoracic or lumbar spine.   Pulses:     R and L carotid,radial,femoral,dorsalis pedis and posterior tibial pulses are full and equal bilaterally Extremities:     No clubbing, cyanosis, edema, or deformity noted with normal full range of motion of all joints.   Neurologic:     sensation intact to light touch, gait normal, and DTRs symmetrical and normal.   Skin:     Intact without suspicious lesions or rashes Cervical Nodes:     No lymphadenopathy noted Psych:     pt seems stressed today    Impression & Recommendations:  Problem # 1:  B12 DEFICIENCY (ICD-266.2) for B12 shot today- some clinical imp with supplementation  Problem # 2:  HYPERTENSION (ICD-401.9) blood pressure remains in good control will continue present med- and keep working on diet and exercise Her updated medication list for this problem includes:    Mavik 2  Mg Tabs (Trandolapril) .Marland Kitchen... Take one by mouth daily  Orders: Venipuncture [id number]) TLB-Renal Function Panel (80069-RENAL)   Problem # 3:  HYPERLIPIDEMIA (B2193296.4) will check lipids today on lopid enc low fat diet Her updated medication list for this problem includes:    Lopid 600 Mg Tabs (Gemfibrozil) .Marland Kitchen... Take one by mouth daily  Orders: Venipuncture [id number]) TLB-Lipid Panel (80061-LIPID) TLB-AST (SGOT) (84450-SGOT) TLB-ALT (SGPT) (84460-ALT)   Complete Medication List: 1)  Lopid 600 Mg Tabs (Gemfibrozil) .... Take one by mouth daily 2)  Mavik 2 Mg Tabs (Trandolapril) .... Take one by mouth daily 3)  Estrace 0.1 Mg/gm Crea (Estradiol) .... Take one half by mouth daily 4)  Calcium Lactate 750 Mg Tabs (Calcium lactate) .... Take two by mouth daily 5)  Tetracycline Hcl 500 Mg Caps (Tetracycline hcl) .... Take one by mouth daily 6)  Glucotrol Xl 5 Mg Tb24 (Glipizide) .... Take one by mouth daily 7)  Vitamin B-12 Cr 1000 Mcg Tbcr (Cyanocobalamin) .... One injection every 8 weeks 8)  Vesicare 10 Mg Tabs (Solifenacin succinate) .... Take one by  mouth daily 9)  Anusol-hc 25 Mg Supp (Hydrocortisone acetate) .Marland Kitchen.. 1 pr at bedtime for 7-14 days 10)  Vitamin D 1000 Iu   Other Orders: TLB-A1C / Hgb A1C (Glycohemoglobin) (83036-A1C)   Patient Instructions: 1)  do the best you can with diet and exercise 2)  we are doing labs today 3)  b12 shot today    ]  Appended Document: 6 MONTH FOLLOW UP/[facility]     Clinical Lists Changes  Orders: Added new Service order of Vit B12 1000 mcg [id number]) - Signed Added new Service order of Admin of Therapeutic Inj  intramuscular or subcutaneous [id number]) - Signed        Medication Administration  Injection # 1:    Medication: Vit B12 1000 mcg    Diagnosis: B12 DEFICIENCY (ICD-266.2)    Route: IM    Site: R deltoid    Exp Date: 05/2009    Lot #: [id number]    Mfr: [employer] Regent    Patient tolerated injection without complications    Given by: Marty Heck (September 08, 2007 9:33 AM)  Orders Added: 1)  Vit B12 1000 mcg [J3420] 2)  Admin of Therapeutic Inj  intramuscular or subcutaneous [id number]

## 2010-09-19 NOTE — Progress Notes (Signed)
Summary: form for diab supplies  Phone Note From Other Clinic   Caller: medical and diab supplies Call For: dr tower Summary of Call: form for diab supplies is on your shelf, please answer # 3. Initial call taken by: Marty Heck,  June 23, 2008 11:15 AM  Follow-up for Phone Call        done- thanks  Follow-up by: [name] MD,  June 23, 2008 1:45 PM  Additional Follow-up for Phone Call Additional follow up Details #1::        form faxed [facility]  June 23, 2008 3:47 PM

## 2010-09-19 NOTE — Letter (Signed)
Summary: Generic Letter  [place] [facility]  [address] [place], [place] [address]   Phone: [phone]  Fax: [phone]    11/29/2008    [place], [place]  [address]     Dear Ms. [name],   Your mammogram was normal, please repeat screening in one year.    Sincerely,   Environmental education officer at [facility]

## 2010-09-19 NOTE — Assessment & Plan Note (Signed)
Summary: B12   Nurse Visit    Prior Medications: LOPID 600 MG  TABS (GEMFIBROZIL) take one by mouth daily MAVIK 2 MG  TABS (TRANDOLAPRIL) take one by mouth daily ESTRACE 0.1 MG/GM  CREA (ESTRADIOL) take one half by mouth daily CALCIUM LACTATE 750 MG  TABS (CALCIUM LACTATE) take two by mouth daily TETRACYCLINE HCL 500 MG  CAPS (TETRACYCLINE HCL) take one by mouth daily GLUCOTROL XL 5 MG  TB24 (GLIPIZIDE) take one by mouth daily VITAMIN B-12 CR 1000 MCG  TBCR (CYANOCOBALAMIN) one injection monthly VESICARE 10 MG  TABS (SOLIFENACIN SUCCINATE) take one by mouth daily     Medication Administration  Injection # 1:    Medication: Vit B12 1000 mcg    Diagnosis: B12 DEFICIENCY (ICD-266.2)    Route: IM    Site: L deltoid    Exp Date: 10/2008    Lot #: [id number]    Mfr: american regent    Patient tolerated injection without complications    Given by: Marty Heck (April 07, 2007 11:27 AM)  Orders Added: 1)  Vit B12 1000 mcg [J3420] 2)  Admin of Therapeutic Inj  intramuscular or subcutaneous [[id number]]      Medication Administration  Injection # 1:    Medication: Vit B12 1000 mcg    Diagnosis: B12 DEFICIENCY (ICD-266.2)    Route: IM    Site: L deltoid    Exp Date: 10/2008    Lot #: [id number]    Mfr: american regent    Patient tolerated injection without complications    Given by: Marty Heck (April 07, 2007 11:27 AM)  Orders Added: 1)  Vit B12 1000 mcg [J3420] 2)  Admin of Therapeutic Inj  intramuscular or subcutaneous [90772]

## 2010-09-19 NOTE — Progress Notes (Signed)
Summary: pin needles  Phone Note From Pharmacy Call back at [phone]   Caller: Patient Caller: [place]* Call For: Dr. Glori Bickers   Summary of Call: Pharmacy called requesting a rx for pin needles for the lantus solostar. Patient is there now, they are teaching her how to use the lantus.  Initial call taken by: Lacretia Nicks,  February 17, 2010 12:37 PM  Follow-up for Phone Call        px written on EMR for call in  Follow-up by: [name] MD,  February 17, 2010 1:50 PM  Additional Follow-up for Phone Call Additional follow up Details #1::        Medication phoned to [facility] as instructed. Ozzie Hoyle LPN  July  1, 624THL 579FGE PM     New/Updated Medications: * PIN NEEDLES FOR LANTUS SOLOSTAR to inject once daily as directed Prescriptions: PIN NEEDLES FOR LANTUS SOLOSTAR to inject once daily as directed  #1 month x 11   Entered and Authorized by:   [name] MD   Signed by:   Ozzie Hoyle LPN on 624THL   Method used:   Telephoned to ...       [facility] (retail)       [address]       [place], [place]  [address]       Ph: [phone]       Fax: [phone]   RxID:   [id number]

## 2010-09-19 NOTE — Assessment & Plan Note (Signed)
Summary: CHECK URINE/TOWER/CLE  Nurse Visit    Prior Medications: LOPID 600 MG  TABS (GEMFIBROZIL) take one by mouth daily MAVIK 2 MG  TABS (TRANDOLAPRIL) take one by mouth daily ESTRACE 0.1 MG/GM  CREA (ESTRADIOL) take one half by mouth daily CALCIUM LACTATE 750 MG  TABS (CALCIUM LACTATE) take two by mouth daily TETRACYCLINE HCL 500 MG  CAPS (TETRACYCLINE HCL) take one by mouth daily VITAMIN B-12 CR 1000 MCG  TBCR (CYANOCOBALAMIN) one injection every 8 weeks VESICARE 10 MG  TABS (SOLIFENACIN SUCCINATE) take one by mouth daily VITAMIN D 1000 IU ()  GLUCOTROL XL 10 MG TB24 (GLIPIZIDE) Take 1 tablet by mouth once a day METHOCARBAMOL 500 MG  TABS (METHOCARBAMOL) Take one by mouth at bedtime Current Allergies: ! VOLTAREN    Orders Added: 1)  T-Culture, Urine [id number]    ]  Appended Document: Orders Update    Clinical Lists Changes  Orders: Added new Service order of UA Dipstick W/ Micro (manual) (81000) - Signed

## 2010-09-19 NOTE — Assessment & Plan Note (Signed)
Summary: URINE CULTURE;UTI/RI  Nurse Visit   Urine culture was collected and pt said she feels much better.Ozzie Hoyle LPN  August 26, 624THL 10:46 AM   Allergies: 1)  ! Voltaren 2)  ! * Actos  Orders Added: 1)  T-Culture, Urine [id number] 2)  Specimen Handling [99000]

## 2010-09-19 NOTE — Assessment & Plan Note (Signed)
Summary: 3 M F/U  DLO   Vital Signs:  Patient Profile:   77 Years Old Female Weight:      216 pounds Temp:     97.4 degrees F oral Pulse rate:   68 / minute Pulse rhythm:   regular BP sitting:   150 / 80  (left arm) Cuff size:   large  Vitals Entered By: Renee [name] (April 28, 2008 9:20 AM)                 Chief Complaint:  3 month f/u had back surgery 1 month ago and blood in stool x 2-3 days?hemorrhoid?.  History of Present Illness: had spinal decompression sx -- has only needed one pain pill since sx is pain free and greadually increasing exercise walking some -- and getting better- and getting strength back   sugar has been high- and eating more too sugars are 150s -160s in the am  also less exercise  people have been bringing in food   some streak of blood in stool -- constipation again with likely hemorrhoid  only happened once when constipated bowels moving regulary now- no blood no fever or abd pain at all  bp is high today- also high at the church  no headache or dizziness-- cannot tell that is is high  wt is up 8 lb from last visit ==from lack of exercise appetite has generally been up as well  starts PT today then has f/u later this mo- is excited to start this   had to take abx for uti right before her sx  wants to get it checked today-- a little burning  - no bladder pain no frequently  no blood in urine        Serial Vital Signs/Assessments:  Time      Position  BP       Pulse  Resp  Temp     By                     142/80                         Renee Earing MD    Current Allergies (reviewed today): ! VOLTAREN  Past Medical History:    Reviewed history from 03/02/2008 and no changes required:       Diabetes mellitus, type II       Hypertension       Osteoarthritis       Hyperlipidemia       angioedema was poss from voltaren       vitamin b12 deficiency 4/08       LVH and atrial enlargement by echo in past with nl EF                  ortho -- Dr Tonita Cong  Past Surgical History:    Reviewed history from 11/27/2006 and no changes required:       Cataract extraction       Hysterectomy, BSO- fibroids       LVH-  EF 60%       Echo- bi-atrial enlargement, mild (05/1995)       Colonoscopy- no polyps (1995),   divertic., ? polyp (06/1998)  , diverticulosis (08/2003)       L4- L5 disk disease- epidural inj. 12/1998       Dexa- osteopenia (04/2002) , stable (10/20050       Knee  surgery- arthroscope       spinal decomression sx 8/09   Family History:    Reviewed history from 03/02/2008 and no changes required:       sister CAD, brain tumor with hemorrhage   Social History:    Reviewed history from 03/02/2008 and no changes required:       husband is ill- she cares for him       non smoker        no alcohol        works a lot outside in garden    Review of [place] of fatigue.      Denies fever, loss of appetite, and malaise.  Eyes      Denies blurring and eye pain.  CV      Denies chest pain or discomfort, palpitations, and shortness of breath with exertion.  Resp      Denies cough and wheezing.  GI      Denies abdominal pain and change in bowel habits.  GU      Denies dysuria, hematuria, urinary frequency, and urinary hesitancy.  MS      Complains of low back pain and stiffness.  Derm      Denies itching, lesion(s), and rash.  Neuro      Complains of tingling.      Denies visual disturbances and weakness.  Psych      mood is fair   Endo      Denies cold intolerance, excessive thirst, excessive urination, and heat intolerance.   Physical Exam  General:     overweight but generally well appearing  Head:     normocephalic, atraumatic, and no abnormalities observed.   Eyes:     vision grossly intact, pupils equal, pupils round, and pupils reactive to light.   Neck:     supple with full rom and no masses or thyromegally, no JVD or carotid bruit   Lungs:     Normal respiratory effort, chest expands symmetrically. Lungs are clear to auscultation, no crackles or wheezes. Heart:     Normal rate and regular rhythm. S1 and S2 normal without gallop, murmur, click, rub or other extra sounds. PMI is non displaced  Abdomen:     Bowel sounds positive,abdomen soft and non-tender without masses, organomegaly or hernias noted. no suprapubic tenderness or fullness felt  Msk:     poor rom LS -- gait is slow and intentional  no CVA tenderness  Pulses:     R and L carotid,radial,femoral,dorsalis pedis and posterior tibial pulses are full and equal bilaterally Extremities:     R ankle with tr-1 plus edema L ankle tr edema some varicosities- spider viens Neurologic:     sensation intact to light touch, sensation intact to pinprick, and DTRs symmetrical and normal.   Skin:     Intact without suspicious lesions or rashes Cervical Nodes:     No lymphadenopathy noted Psych:     normal affect, talkative and pleasant     Impression & Recommendations:  Problem # 1:  CYSTITIS (ICD-595.9) Assessment: Improved s/p tx recently will check urine cx and update/tx if pos  enc good water intake  Her updated medication list for this problem includes:    Tetracycline Hcl 500 Mg Caps (Tetracycline hcl) .Marland Kitchen... Take one by mouth daily    Vesicare 10 Mg Tabs (Solifenacin succinate) .Marland Kitchen... Take one by mouth daily  Orders: T-Culture, Urine [id number])   Problem #  2:  BACK PAIN (ICD-724.5) Assessment: Improved much imp s/p decomp sx to start pt today Her updated medication list for this problem includes:    Methocarbamol 500 Mg Tabs (Methocarbamol) .Marland Kitchen... Take one by mouth at bedtime   Problem # 3:  B12 DEFICIENCY (ICD-266.2) Assessment: Unchanged shot today (2 mo) Orders: Vit B12 1000 mcg (J3420) Admin of Therapeutic Inj  intramuscular or subcutaneous [id number])   Problem # 4:  HYPERTENSION (ICD-401.9) Assessment: Deteriorated bp is up  asked  to check regularly at home (with better diet and exercise) f/u with record in 1 mo will inc tx if necessary Her updated medication list for this problem includes:    Mavik 2 Mg Tabs (Trandolapril) .Marland Kitchen... Take one by mouth daily  BP today: 150/80 Prior BP: 130/78 (03/02/2008)  Labs Reviewed: Creat: 1.3 (01/26/2008) Chol: 166 (01/26/2008)   HDL: 28.2 (01/26/2008)   LDL: 117 (01/26/2008)   TG: 103 (01/26/2008)   Problem # 5:  DIABETES MELLITUS, TYPE II (ICD-250.00) Assessment: Deteriorated sugars up with sx and also poor diet/less activity next step is to add actos (no metformin due to renal insuff) pt wants to avoid this-- will work hard on diet and exercise and f/u with sugar record in 93mo Her updated medication list for this problem includes:    Mavik 2 Mg Tabs (Trandolapril) .Marland Kitchen... Take one by mouth daily    Glucotrol Xl 10 Mg Tb24 (Glipizide) .Marland Kitchen... Take 1 tablet by mouth once a day   Problem # 6:  RECTAL BLEEDING (ICD-569.3) Assessment: Improved one isolated episode with constipation  highly suspect hemorroid disc effort to keep stools soft and minimize strain update if this reoccurs will disc colonosc once recovered from back sx  last one showed tics in 2005 (does have fam hx)  Complete Medication List: 1)  Lopid 600 Mg Tabs (Gemfibrozil) .... Take one by mouth daily 2)  Mavik 2 Mg Tabs (Trandolapril) .... Take one by mouth daily 3)  Estrace 0.1 Mg/gm Crea (Estradiol) .... Take one half by mouth daily 4)  Calcium Lactate 750 Mg Tabs (Calcium lactate) .... Take two by mouth daily 5)  Tetracycline Hcl 500 Mg Caps (Tetracycline hcl) .... Take one by mouth daily 6)  Vitamin B-12 Cr 1000 Mcg Tbcr (Cyanocobalamin) .... One injection every 8 weeks 7)  Vesicare 10 Mg Tabs (Solifenacin succinate) .... Take one by mouth daily 8)  Vitamin D 1000 Iu  9)  Glucotrol Xl 10 Mg Tb24 (Glipizide) .... Take 1 tablet by mouth once a day 10)  Methocarbamol 500 Mg Tabs (Methocarbamol) .... Take  one by mouth at bedtime   Patient Instructions: 1)  work hard on weight loss 2)  exercise as tolerated-- and as advised by your back doctor-- optimally 20 or more minutes of walking daily  3)  check your sugar in am (fasting )- and again 2 hours after a meal later in the day  4)  keep eye on blood pressure  5)  follow up in 1 month -- bring your sugar and blood pressure records and I will make a plan  6)  we will check your urine culture today and advise you further when it returns    ]  Medication Administration  Injection # 1:    Medication: Vit B12 1000 mcg    Diagnosis: B12 DEFICIENCY (ICD-266.2)    Route: IM    Site: R deltoid    Exp Date: 01/17/2010    Lot #: [id number]    Mfr: American  Regent    Patient tolerated injection without complications    Given by: Renee [name] (April 28, 2008 12:06 PM)  Orders Added: 1)  T-Culture, Urine [id number] 2)  Vit B12 1000 mcg [J3420] 3)  Admin of Therapeutic Inj  intramuscular or subcutaneous [[id number]] 4)  Est. Patient Level IV [id number]

## 2010-09-19 NOTE — Assessment & Plan Note (Signed)
Summary: 6 WK F/U DLO   Vital Signs:  Patient Profile:   77 Years Old Female Weight:      217 pounds Temp:     97.2 degrees F BP supine:   154 / 80                 History of Present Illness: stress has not decreased  brother is back in hospital - and she is still taking care of everybody  last visit - started on actos 15 mg  has seen some imp   usually in the ams 140s - 150s  later in the day-- has not checked because she is too busy  stays hungry all the time  wants to nibble on something -- carrots and raw turnups and fruit   not totally recovered from surgery 4 mo ago-- still does not have energy she did  (had spinal decompression)- her surgeon did release her  is waiting for the weather breaks   wt is the same as last   has a treadmill , but does not use it - is in the basement -- and not strong enough to go up and down the steps  is not motivated - but no pain   some blood in stool- bright red  thought it was her hem-- started when she got constipated  ? if poss from tics last colonosc showing tics was 1/05-- and thinks due for 5 year  thinks she is due for colonoscopy  no abd pain or n/v        Current Allergies: ! VOLTAREN  Past Medical History:    Diabetes mellitus, type II    Hypertension    Osteoarthritis    Hyperlipidemia    angioedema was poss from voltaren    vitamin b12 deficiency 4/08    LVH and atrial enlargement by echo in past with nl EF    degenerative disk dz            ortho -- Dr Tonita Cong  Past Surgical History:    Reviewed history from 04/28/2008 and no changes required:       Cataract extraction       Hysterectomy, BSO- fibroids       LVH-  EF 60%       Echo- bi-atrial enlargement, mild (05/1995)       Colonoscopy- no polyps (1995),   divertic., ? polyp (06/1998)  , diverticulosis (08/2003)       L4- L5 disk disease- epidural inj. 12/1998       Dexa- osteopenia (04/2002) , stable (10/20050       Knee surgery- arthroscope        spinal decomression sx 8/09   Family History:    Reviewed history from 03/02/2008 and no changes required:       sister CAD, brain tumor with hemorrhage   Social History:    Reviewed history from 03/02/2008 and no changes required:       husband is ill- she cares for him       non smoker        no alcohol        works a lot outside in garden    Review of [place] of fatigue.      Denies fever, loss of appetite, malaise, and weight loss.  Eyes      Denies blurring and eye pain.  ENT      Denies sore  throat.  CV      Denies chest pain or discomfort, lightheadness, palpitations, shortness of breath with exertion, and swelling of feet.  Resp      Denies cough and wheezing.  GI      Complains of hemorrhoids.      Denies abdominal pain, change in bowel habits, indigestion, nausea, and vomiting.  MS      Complains of low back pain, muscle weakness, and stiffness.      Denies joint redness and joint swelling.  Derm      Denies itching, lesion(s), and rash.  Neuro      Complains of tingling and weakness.      Denies numbness, tremors, and visual disturbances.  Psych      Complains of anxiety.      Denies panic attacks, sense of great danger, and suicidal thoughts/plans.  Endo      Denies cold intolerance, excessive thirst, excessive urination, and heat intolerance.  Heme      Denies abnormal bruising and bleeding.   Physical Exam  General:     overweight but generally well appearing  Head:     normocephalic, atraumatic, and no abnormalities observed.   Eyes:     vision grossly intact, pupils equal, pupils round, and pupils reactive to light.  no conjunctival pallor, injection or icterus  Mouth:     pharynx pink and moist.   Neck:     supple with full rom and no masses or thyromegally, no JVD or carotid bruit  Chest Wall:     No deformities, masses, or tenderness noted. Lungs:     Normal respiratory effort, chest expands  symmetrically. Lungs are clear to auscultation, no crackles or wheezes. Heart:     Normal rate and regular rhythm. S1 and S2 normal without gallop, murmur, click, rub or other extra sounds. Abdomen:     soft and non-tender.  no renal bruits  Msk:     poor rom LS -- gait is slow and intentional  no CVA tenderness pt can raise from a chair w/o asst- but needs asst to get up on table due to generalized weakness  Pulses:     R and L carotid,radial,femoral,dorsalis pedis and posterior tibial pulses are full and equal bilaterally Extremities:     some varicosities  no edema today Neurologic:     no focal weakness noted  generally weak with poor exercise tolerance and balance  cranial nerves II-XII intact, strength normal in all extremities, sensation intact to light touch, and DTRs symmetrical and normal.   no tremor  Skin:     Intact without suspicious lesions or rashes Cervical Nodes:     No lymphadenopathy noted Psych:     affect is somewhat depressed today- but not tearful nl eye contact     Impression & Recommendations:  Problem # 1:  MUSCLE WEAKNESS (GENERALIZED) (ICD-728.87) Assessment: New after surg in summer- never got strength or stamina back no focal deficits or worry about neurologic event  is motivated to walk on treadmill will ref to pt for deconditioning Orders: Physical Therapy Referral (PT)   Problem # 2:  STRESS REACTION, ACUTE, WITH EMOTIONAL DISTURBANCE (ICD-308.0) Assessment: Improved overall is doing some better  pt is not interested in counseling at this time disc imp of breaking the stress eating cycle   Problem # 3:  RENAL INSUFFICIENCY (ICD-588.9) Assessment: Unchanged will continue to watch carefully with inc in [facility] (suspect multifactorial) check labs in 2 weeks  adv  to keep up water intake  Problem # 4:  B12 DEFICIENCY (ICD-266.2) Assessment: Unchanged shot today ? if supplementing this helps energy level Orders: Vit B12 1000 mcg  (J3420) Admin of Therapeutic Inj  intramuscular or subcutaneous [id number])   Problem # 5:  HYPERTENSION (ICD-401.9) Assessment: Deteriorated bp high past 2 visits stress and sedentary lifestyle are not helping inc mavik- keeping close eye on renal fxn labs 2 wk f/ 4-6 wk Her updated medication list for this problem includes:    Mavik 4 Mg Tabs (Trandolapril) .Marland Kitchen... 1 by mouth once daily   Problem # 6:  CARCINOMA, COLON, FAMILY HX (ICD-V16.0) Assessment: Comment Only with some blood in stool (prob hem) ref for 5 year colonosc Orders: Gastroenterology Referral (GI)   Problem # 7:  DIABETES MELLITUS, TYPE II (ICD-250.00) Assessment: Improved mild imp with actos continue to watch diet  will make further plan at f/u-- may need to inc dose  is tolerating well sched labs for hepatic fxn Her updated medication list for this problem includes:    Mavik 4 Mg Tabs (Trandolapril) .Marland Kitchen... 1 by mouth once daily    Glucotrol Xl 10 Mg Tb24 (Glipizide) .Marland Kitchen... Take 1 tablet by mouth once a day    Actos 15 Mg Tabs (Pioglitazone hcl) .Marland Kitchen... 1 by mouth each am  Labs Reviewed: HgBA1c: 7.1 (06/29/2008)   Creat: 1.2 (06/29/2008)   Microalbumin: 0.2 (10/18/2006)   Complete Medication List: 1)  Lopid 600 Mg Tabs (Gemfibrozil) .... Take one by mouth daily 2)  Mavik 4 Mg Tabs (Trandolapril) .Marland Kitchen.. 1 by mouth once daily 3)  Estrace 0.1 Mg/gm Crea (Estradiol) .... Take one half by mouth daily 4)  Calcium Lactate 750 Mg Tabs (Calcium lactate) .... Take two by mouth daily 5)  Tetracycline Hcl 500 Mg Caps (Tetracycline hcl) .... Take one by mouth daily 6)  Vitamin B-12 Cr 1000 Mcg Tbcr (Cyanocobalamin) .... One injection every 8 weeks 7)  Vesicare 10 Mg Tabs (Solifenacin succinate) .... Take one by mouth daily 8)  Vitamin D 1000 Iu  9)  Glucotrol Xl 10 Mg Tb24 (Glipizide) .... Take 1 tablet by mouth once a day 10)  Methocarbamol 500 Mg Tabs (Methocarbamol) .... Take one by mouth at bedtime 11)  Actos 15 Mg  Tabs (Pioglitazone hcl) .Marland Kitchen.. 1 by mouth each am   Patient Instructions: 1)  B12 shot today 2)  we will refer you for PT at check out  3)  we will refer you for colonoscopy at check out 4)  increase your mavik to 4 mg once daily  5)  schedule non fasting labs in 2 weeks renal/hepatic 995.2 6)  follow up with me in 4-6 weeks - to review sugar and blood pressure    Prescriptions: ACTOS 15 MG TABS (PIOGLITAZONE HCL) 1 by mouth each am  #30 x 5   Entered and Authorized by:   [name] MD   Signed by:   [name] MD on 08/03/2008   Method used:   Print then Give to Patient   RxID:   [id number] [place] 4 MG TABS (TRANDOLAPRIL) 1 by mouth once daily  #30 x 11   Entered and Authorized by:   [name] MD   Signed by:   [name] MD on 08/03/2008   Method used:   Print then Give to Patient   RxID:   [id number]  ]   Medication Administration  Injection # 1:    Medication: Vit B12 1000  mcg    Diagnosis: B12 DEFICIENCY (ICD-266.2)    Route: IM    Site: L deltoid    Exp Date: 01/17/2010    Lot #: [id number]    Mfr: [employer] Regent    Patient tolerated injection without complications    Given by: [name] (August 03, 2008 2:45 PM)  Orders Added: 1)  Gastroenterology Referral [GI] 2)  Physical Therapy Referral [PT] 3)  Vit B12 1000 mcg [J3420] 4)  Admin of Therapeutic Inj  intramuscular or subcutaneous [96372] 5)  Est. Patient Level IV [id number]

## 2010-09-19 NOTE — Letter (Signed)
Summary: [facility]  [facility]   Imported By: Edmonia James 10/17/2009 11:20:47  _____________________________________________________________________  External Attachment:    Type:   Image     Comment:   External Document

## 2010-09-19 NOTE — Letter (Signed)
Summary: Results Follow up Letter  [place] [facility]  [address] [place], [place] [address]   Phone: [phone]  Fax: [phone]    03/22/2010 MRN: [id number]    Renee [name] [address], [name]  [address]    Dear Ms. [name],  The following are the results of your recent test(s):  Test         Result    Pap Smear:        Normal _____  Not Normal _____ Comments: ______________________________________________________ Cholesterol: LDL(Bad cholesterol):         Your goal is less than:         HDL (Good cholesterol):       Your goal is more than: Comments:  ______________________________________________________ Mammogram:        Normal __X___  Not Normal _____ Comments:Repeat in one year.   ___________________________________________________________________ Hemoccult:        Normal _____  Not normal _______ Comments:    _____________________________________________________________________ Other Tests:    We routinely do not discuss normal results over the telephone.  If you desire a copy of the results, or you have any questions about this information we can discuss them at your next office visit.   Sincerely,    Roque Lias Tower,MD  MT/ri

## 2010-09-19 NOTE — Medication Information (Signed)
Summary: Refill Request for Oxybutynin Chloride  Refill Request for Oxybutynin Chloride   Imported By: Laural Benes 07/14/2010 12:20:34  _____________________________________________________________________  External Attachment:    Type:   Image     Comment:   External Document

## 2010-09-19 NOTE — Progress Notes (Signed)
Summary: PT TO BE SCHEDULED FOR COLON PROCEDURE  Phone Note Call from Patient   Summary of Call: DR Jenny Reichmann HAYES RETURND [facility]'S CALL...SAYS SHE WILL SCHEDULE A COLONSCOPIC FOR PT.  SAYS IT WAS TIME, MORE SO BECAUSE OF THE RECTAL BLEEDING..SAYS THEY WILL CONTACT PT REGARDING THE DATE AND TIME OF THE PROCEDURE.  FYI TO DR Windy Kalata AND [facility].Marland KitchenMarland KitchenAllen Norris  August 03, 2008 2:28 PM Initial call taken by: Allen Norris,  August 03, 2008 2:28 PM

## 2010-09-19 NOTE — Assessment & Plan Note (Signed)
Summary: 1 month follow up/rbh   Vital Signs:  Patient profile:   77 year old female Weight:      209 pounds BMI:     37.16 Temp:     97.8 degrees F oral Pulse rate:   76 / minute Pulse rhythm:   regular BP sitting:   140 / 74  (left arm) Cuff size:   regular  Vitals Entered By: Marty Heck CMA (August 08, 2009 8:52 AM) CC: 1 month follow up   History of Present Illness: here for f/u of mult med probs  lost 5 lb since last visit is getting rid of junk food and sweets  also not a great appetite since she was sick   itching - gave shot of cortisone and also antihistamine -- and then a course of prednisone taper  made her sugar really high ( 200s highest) -- 220s this -- just finished the prednisone itching is gone  could not find a cause of her itch -- no specific allergy  thought it may be from inflammation (? actos ) -- off of that  will f/u in march  is overall much better   zoster -- still quite a bit of pain  ran out of ultram-- ? has a refil  still a little numbness in leg   bp is 140/74 today    DM -- sugars on better diet / sugars are   lipids last LDL 120s-- working on diet   cr last 1.5- and was ref to renal, has appt upcoming in early jan     Allergies: 1)  ! Voltaren 2)  ! * Actos  Past History:  Past Surgical History: Last updated: 12/20/2008 Cataract extraction Hysterectomy, BSO- fibroids LVH-  EF 60% Echo- bi-atrial enlargement, mild (05/1995) Colonoscopy- no polyps (1995),   divertic., ? polyp (06/1998)  , diverticulosis (08/2003) L4- L5 disk disease- epidural inj. 12/1998 Dexa- osteopenia (04/2002) , stable (10/20050 Knee surgery- arthroscope spinal decomression sx 8/09 (12/09) colonoscopy polyp/ adenomatous - re check 5 y  (flex sig done 4/10 was nl -- so rec 5 y f/u) 4/10 dexa stable osteopenia   Family History: Last updated: 03/02/2008 sister CAD, brain tumor with hemorrhage   Social History: Last updated:  03/02/2008 husband is ill- she cares for him non smoker  no alcohol  works a lot outside in garden  Risk Factors: Smoking Status: never (11/27/2006)  Past Medical History: Diabetes mellitus, type II Hypertension Osteoarthritis Hyperlipidemia angioedema was poss from voltaren vitamin b12 deficiency 4/08 LVH and atrial enlargement by echo in past with nl EF degenerative disk dz osteopenia  pruritis  renal insufficiency  ortho -- Dr Tonita Cong GI - Dr Amedeo Plenty   Review of Systems General:  Complains of fatigue; denies fever, loss of appetite, and malaise. Eyes:  Denies blurring and eye irritation. CV:  Denies chest pain or discomfort, palpitations, and shortness of breath with exertion. Resp:  Denies cough, shortness of breath, and wheezing. GI:  Denies abdominal pain, bloody stools, and change in bowel habits. MS:  Denies joint pain. Derm:  Denies itching, lesion(s), poor wound healing, and rash. Neuro:  Denies poor balance and tingling. Endo:  Denies cold intolerance and heat intolerance. Heme:  Denies abnormal bruising and bleeding.  Physical Exam  General:  overweight but generally well appearing  Head:  normocephalic, atraumatic, and no abnormalities observed.    Eyes:  vision grossly intact, pupils equal, pupils round, and pupils reactive to light.   Mouth:  pharynx  pink and moist.   Neck:  supple with full rom and no masses or thyromegally, no JVD or carotid bruit  Lungs:  Normal respiratory effort, chest expands symmetrically. Lungs are clear to auscultation, no crackles or wheezes. Heart:  Normal rate and regular rhythm. S1 and S2 normal without gallop, murmur, click, rub or other extra sounds. Abdomen:  Bowel sounds positive,abdomen soft and non-tender without masses, organomegaly or hernias noted. Msk:  no acute joint changes  Extremities:  trace LE edema Neurologic:  sensation intact to light touch, gait normal, and DTRs symmetrical and normal.   Skin:  dermatomal  rash is much improved - dry with some hyperpigmentation and tendernes  Cervical Nodes:  No lymphadenopathy noted Inguinal Nodes:  No significant adenopathy Psych:  normal affect, talkative and pleasant   Diabetes Management Exam:    Foot Exam (with socks and/or shoes not present):       Sensory-Pinprick/Light touch:          Left medial foot (L-4): normal          Left dorsal foot (L-5): normal          Left lateral foot (S-1): normal          Right medial foot (L-4): normal          Right dorsal foot (L-5): normal          Right lateral foot (S-1): normal       Sensory-Monofilament:          Left foot: normal          Right foot: normal       Inspection:          Left foot: normal          Right foot: normal       Nails:          Left foot: normal          Right foot: normal   Impression & Recommendations:  Problem # 1:  SHINGLES (ICD-053.9) Assessment Improved with some post herpetic neuralgia will continue ultram as needed pt wants to avoid lyrica and neurontin if poss - due to pos side eff of wt gain and swelling will update if pain worsens or if no improvement   Problem # 2:  PRURITUS (ICD-698.9) Assessment: Improved resolved after prednisone- will continue allergy f/u ? if poss from actos  Problem # 3:  DIABETES MELLITUS, TYPE II (ICD-250.00) Assessment: Deteriorated  worse with prednisone curious to see how control will be later with much imp diet in light of renal insuff- limited med opt  may need to consider byetta or poss lantus if not imp  lab 3 mo and f/u Her updated medication list for this problem includes:    Mavik 4 Mg Tabs (Trandolapril) .Marland Kitchen... 1 by mouth once daily    Glucotrol Xl 10 Mg Xr24h-tab (Glipizide) .Marland Kitchen... Take 1 tablet by mouth once a day  Labs Reviewed: Creat: 1.5 (06/17/2009)     Last Eye Exam: normal (11/24/2008) Reviewed HgBA1c results: 7.0 (06/17/2009)  6.4 (02/14/2009)  Problem # 4:  RENAL INSUFFICIENCY (ICD-588.9) Assessment:  Unchanged for f/u with renal  is likely multifactorial has ace allergy  Problem # 5:  HYPERTENSION (ICD-401.9) Assessment: Deteriorated  bp is high from prednisone  will re check next visit commended lifestyle change and wt loss so far Her updated medication list for this problem includes:    Mavik 4 Mg Tabs (Trandolapril) .Marland Kitchen... 1 by mouth  once daily    Lasix 20 Mg Tabs (Furosemide) .Marland Kitchen... 1 by mouth each am  BP today: 140/74 Prior BP: 100/80 (07/08/2009)  Labs Reviewed: K+: 4.5 (06/17/2009) Creat: : 1.5 (06/17/2009)   Chol: 191 (06/17/2009)   HDL: 33.50 (06/17/2009)   LDL: 123 (06/17/2009)   TG: 172.0 (06/17/2009)  Problem # 6:  HYPERLIPIDEMIA (ICD-272.4) Assessment: Unchanged  will re check 3 mo with better diet  Her updated medication list for this problem includes:    Lopid 600 Mg Tabs (Gemfibrozil) .Marland Kitchen... Take one by mouth daily  Labs Reviewed: SGOT: 18 (06/17/2009)   SGPT: 21 (06/17/2009)   HDL:33.50 (06/17/2009), 29.00 (02/14/2009)  LDL:123 (06/17/2009), 115 (02/14/2009)  Chol:191 (06/17/2009), 175 (02/14/2009)  Trig:172.0 (06/17/2009), 156.0 (02/14/2009)  Complete Medication List: 1)  Lopid 600 Mg Tabs (Gemfibrozil) .... Take one by mouth daily 2)  Mavik 4 Mg Tabs (Trandolapril) .Marland Kitchen.. 1 by mouth once daily 3)  Estrace 1 Mg Tabs (Estradiol) .... 1/2 tab by mouth once daily 4)  Calcium Lactate 750 Mg Tabs (Calcium lactate) .... Take two by mouth daily 5)  Tetracycline Hcl 500 Mg Caps (Tetracycline hcl) .... Take one by mouth daily 6)  Vitamin B-12 Cr 1000 Mcg Tbcr (Cyanocobalamin) .... One injection every 8 weeks 7)  Vesicare 10 Mg Tabs (Solifenacin succinate) .... Take one by mouth daily 8)  Vitamin D 1000 Iu  .... One daily 9)  Centrum Tabs (Multiple vitamins-minerals) .... Take 1 tablet by mouth once a day 10)  Lasix 20 Mg Tabs (Furosemide) .Marland Kitchen.. 1 by mouth each am 11)  Glucotrol Xl 10 Mg Xr24h-tab (Glipizide) .... Take 1 tablet by mouth once a day 12)  Triamcinolone  Acetonide 0.025 % Lotn (Triamcinolone acetonide) .... Apply to affected area once daily as needed 13)  Benadryl 25 Mg Tabs (Diphenhydramine hcl) .... Otc as directed. 14)  Ultram 50 Mg Tabs (Tramadol hcl) .Marland Kitchen.. 1 by mouth up to three times a day as needed pain 15)  Hydroxyzine Hcl 25 Mg Tabs (Hydroxyzine hcl) .... Take one by mouth three times a day 16)  Xyzal 5 Mg Tabs (Levocetirizine dihydrochloride) .... Take one by mouth at bedtime.  Patient Instructions: 1)  continue ultram for shingles pain--let me know if that is not helping  2)  keep up the good effort with diet for diabetes  3)  no other change in medicines 4)  blood pressure is high from prednisone today- will re check at next visit 5)  follow up with the kidney doctor as planned 6)  schedule fasting labs in 3 months lipid/ast/alt AIC / renal 250.0, 272 and then follow up  7)  if sugars do not start to improve in 2 weeks - please update me   Prior Medications (reviewed today): LOPID 600 MG  TABS (GEMFIBROZIL) take one by mouth daily MAVIK 4 MG TABS (TRANDOLAPRIL) 1 by mouth once daily ESTRACE 1 MG TABS (ESTRADIOL) 1/2 tab by mouth once daily CALCIUM LACTATE 750 MG  TABS (CALCIUM LACTATE) take two by mouth daily TETRACYCLINE HCL 500 MG  CAPS (TETRACYCLINE HCL) take one by mouth daily VITAMIN B-12 CR 1000 MCG  TBCR (CYANOCOBALAMIN) one injection every 8 weeks VESICARE 10 MG  TABS (SOLIFENACIN SUCCINATE) take one by mouth daily VITAMIN D 1000 IU () one daily CENTRUM  TABS (MULTIPLE VITAMINS-MINERALS) Take 1 tablet by mouth once a day LASIX 20 MG TABS (FUROSEMIDE) 1 by mouth each am GLUCOTROL XL 10 MG XR24H-TAB (GLIPIZIDE) Take 1 tablet by mouth once a day  TRIAMCINOLONE ACETONIDE 0.025 % LOTN (TRIAMCINOLONE ACETONIDE) apply to affected area once daily as needed BENADRYL 25 MG TABS (DIPHENHYDRAMINE HCL) OTC As directed. ULTRAM 50 MG TABS (TRAMADOL HCL) 1 by mouth up to three times a day as needed pain HYDROXYZINE HCL 25 MG TABS  (HYDROXYZINE HCL) take one by mouth three times a day XYZAL 5 MG TABS (LEVOCETIRIZINE DIHYDROCHLORIDE) take one by mouth at bedtime. Current Allergies (reviewed today): ! VOLTAREN ! * ACTOS

## 2010-09-19 NOTE — Assessment & Plan Note (Signed)
Summary: 2:00PMU/A FOR BACK PAIN NURSE VISIT PER DR Ladale Sherburn/[place]  Nurse Visit   Allergies: 1)  ! Voltaren 2)  ! * Actos Laboratory Results   Urine Tests  Date/Time Received: March 27, 2010 3:07 PM  Date/Time Reported: March 27, 2010 3:07 PM   Routine Urinalysis   Color: yellow Appearance: Clear Glucose: negative   (Normal Range: Negative) Bilirubin: negative   (Normal Range: Negative) Ketone: negative   (Normal Range: Negative) Spec. Gravity: 1.020   (Normal Range: 1.003-1.035) Blood: negative   (Normal Range: Negative) pH: 6.0   (Normal Range: 5.0-8.0) Protein: negative   (Normal Range: Negative) Urobilinogen: 0.2   (Normal Range: 0-1) Nitrite: negative   (Normal Range: Negative) Leukocyte Esterace: trace   (Normal Range: Negative)  Urine Microscopic WBC/HPF: 0-1 RBC/HPF: 0 Bacteria/HPF: few Mucous/HPF: few Epithelial/HPF: 0-1 Crystals/HPF: 0 Casts/LPF: 0 Yeast/HPF: 0 Other: 0    Comments: Pt complains of low back pain, no other sxs.  Uses [facility]. please let her know urine looks fairly negative will send for cx and update her  f/u if symptoms persist or worsen   Patient notified as instructed by telephone.  Urine was sent to lab for culture.Ozzie Hoyle LPN  August  8, [id number] 5:05 PM  Orders Added: 1)  T-Culture, Urine [id number] 2)  Est. Patient Level I [id number] 3)  UA Dipstick w/o Micro (manual) [81002] 4)  Specimen Handling [[id number]]

## 2010-09-19 NOTE — Assessment & Plan Note (Signed)
Summary: f/u/bir   Vital Signs:  Patient Profile:   77 Years Old Female Weight:      216 pounds Temp:     97.6 degrees F oral Pulse rate:   72 / minute Pulse rhythm:   regular BP sitting:   122 / 80  (left arm) Cuff size:   large  Vitals Entered By: [name] (May 28, 2008 8:14 AM)                 Chief Complaint:  f/u.  History of Present Illness: is doing better overall-- is beginning to do more that she wants to  on monday had a virus-- felt better since   sugars are generally in 150s -- to avg of 160 is working on her diet -- has to do a lot of cooking for different events  did not try with diet like she thought she should   she thinks she could do better with diet and exercise for her diabetes  did not follow through with the plan last time    Serial Vital Signs/Assessments:  Time      Position  BP       Pulse  Resp  Temp     By                     132/80                         [name] MD    Current Allergies (reviewed today): ! VOLTAREN  Past Medical History:    Reviewed history from 04/28/2008 and no changes required:       Diabetes mellitus, type II       Hypertension       Osteoarthritis       Hyperlipidemia       angioedema was poss from voltaren       vitamin b12 deficiency 4/08       LVH and atrial enlargement by echo in past with nl EF                     ortho -- Dr Tonita Cong  Past Surgical History:    Reviewed history from 04/28/2008 and no changes required:       Cataract extraction       Hysterectomy, BSO- fibroids       LVH-  EF 60%       Echo- bi-atrial enlargement, mild (05/1995)       Colonoscopy- no polyps (1995),   divertic., ? polyp (06/1998)  , diverticulosis (08/2003)       L4- L5 disk disease- epidural inj. 12/1998       Dexa- osteopenia (04/2002) , stable (10/20050       Knee surgery- arthroscope       spinal decomression sx 8/09   Family History:    Reviewed history from 03/02/2008 and no changes  required:       sister CAD, brain tumor with hemorrhage   Social History:    Reviewed history from 03/02/2008 and no changes required:       husband is ill- she cares for him       non smoker        no alcohol        works a lot outside in garden    Review of [place]      Denies fatigue, loss of  appetite, and malaise.  Eyes      Denies blurring and eye pain.  CV      Denies chest pain or discomfort and palpitations.  Resp      Denies cough.  GI      Denies abdominal pain and change in bowel habits.  Derm      Denies lesion(s) and rash.  Neuro      Denies numbness and tingling.  Psych      mood is ok   Endo      Denies excessive thirst and excessive urination.   Physical Exam  General:     overweight but generally well appearing  Head:     normocephalic, atraumatic, and no abnormalities observed.   Eyes:     vision grossly intact, pupils equal, pupils round, and pupils reactive to light.   Neck:     supple with full rom and no masses or thyromegally, no JVD or carotid bruit  Lungs:     Normal respiratory effort, chest expands symmetrically. Lungs are clear to auscultation, no crackles or wheezes. Heart:     Normal rate and regular rhythm. S1 and S2 normal without gallop, murmur, click, rub or other extra sounds. PMI is non displaced  Pulses:     R and L carotid,radial,femoral,dorsalis pedis and posterior tibial pulses are full and equal bilaterally Extremities:     R ankle with tr-1 plus edema L ankle tr edema some varicosities- spider viens Neurologic:     sensation intact to light touch, gait normal, and DTRs symmetrical and normal.   Skin:     Intact without suspicious lesions or rashes Cervical Nodes:     No lymphadenopathy noted Psych:     normal affect, talkative and pleasant     Impression & Recommendations:  Problem # 1:  HYPERTENSION (ICD-401.9) Assessment: Improved bp is improved from prevoiusly-- poss due to less  stress and gradual inc in activitiy will continue to monitor on current meds  enc steady inc in exercise and 3 lb wt loss in a month Her updated medication list for this problem includes:    Mavik 2 Mg Tabs (Trandolapril) .Marland Kitchen... Take one by mouth daily  BP today: 122/80 Prior BP: 150/80 (04/28/2008)  Labs Reviewed: Creat: 1.3 (01/26/2008) Chol: 166 (01/26/2008)   HDL: 28.2 (01/26/2008)   LDL: 117 (01/26/2008)   TG: 103 (01/26/2008)   Problem # 2:  DIABETES MELLITUS, TYPE II (ICD-250.00) Assessment: Unchanged sugars are still in sub optimal control due to poor diet disc this in detail-- and pt wants to change her ways before adding med (actos) disc diet /exercise and 3 lb wt loss goal for 1 mo if not imp 1 mo-- consider add med  Her updated medication list for this problem includes:    Mavik 2 Mg Tabs (Trandolapril) .Marland Kitchen... Take one by mouth daily    Glucotrol Xl 10 Mg Tb24 (Glipizide) .Marland Kitchen... Take 1 tablet by mouth once a day  Labs Reviewed: HgBA1c: 6.5 (01/26/2008)   Creat: 1.3 (01/26/2008)   Microalbumin: 0.2 (10/18/2006)   Problem # 3:  BACK PAIN (ICD-724.5) Assessment: Improved much imp after spinal decompression surgery  doing well -- grad inc activity Her updated medication list for this problem includes:    Methocarbamol 500 Mg Tabs (Methocarbamol) .Marland Kitchen... Take one by mouth at bedtime   Complete Medication List: 1)  Lopid 600 Mg Tabs (Gemfibrozil) .... Take one by mouth daily 2)  Mavik 2 Mg Tabs (Trandolapril) .... Take one  by mouth daily 3)  Estrace 0.1 Mg/gm Crea (Estradiol) .... Take one half by mouth daily 4)  Calcium Lactate 750 Mg Tabs (Calcium lactate) .... Take two by mouth daily 5)  Tetracycline Hcl 500 Mg Caps (Tetracycline hcl) .... Take one by mouth daily 6)  Vitamin B-12 Cr 1000 Mcg Tbcr (Cyanocobalamin) .... One injection every 8 weeks 7)  Vesicare 10 Mg Tabs (Solifenacin succinate) .... Take one by mouth daily 8)  Vitamin D 1000 Iu  9)  Glucotrol Xl 10 Mg  Tb24 (Glipizide) .... Take 1 tablet by mouth once a day 10)  Methocarbamol 500 Mg Tabs (Methocarbamol) .... Take one by mouth at bedtime   Patient Instructions: 1)  go on 1 month of strict diabetic diet 2)  gradually increase your exercise 3)  work on loosing 3 lbs in that month 4)  keep watching sugar and blood pressue 5)  follow up with me in about a month  6)  flu shot today   ]

## 2010-09-19 NOTE — Miscellaneous (Signed)
Summary: BONE DENSITY  Clinical Lists Changes  Orders: Added new Test order of T-Bone Densitometry (77080) - Signed Added new Test order of T-Lumbar Vertebral Assessment (77082) - Signed 

## 2010-09-19 NOTE — Progress Notes (Signed)
Summary: numbness in left leg  Phone Note Call from Patient Call back at Home Phone [phone]   Caller: Patient Call For: [name] MD Summary of Call: Pt states she has some numbness in left leg where she had the shingles and she is asking if this is normal.  Please advise. Initial call taken by: Marty Heck CMA,  July 18, 2009 1:15 PM  Follow-up for Phone Call        some mild numbness and tingling is not out of the ordinary  if severe numbness or any weakness- please update me  Follow-up by: [name] MD,  July 18, 2009 1:42 PM  Additional Follow-up for Phone Call Additional follow up Details #1::        Patient notified as instructed by telephone.  Additional Follow-up by: Ozzie Hoyle LPN,  November 29, [id number] 2:47 PM

## 2010-09-19 NOTE — Letter (Signed)
Summary: [facility]  [facility]   Imported By: Edmonia James 11/15/2009 13:47:17  _____________________________________________________________________  External Attachment:    Type:   Image     Comment:   External Document

## 2010-09-19 NOTE — Miscellaneous (Signed)
Summary: med update  Medications Added GLUCOTROL XL 10 MG TB24 (GLIPIZIDE) Take 1 tablet by mouth once a day       Clinical Lists Changes  Medications: Added new medication of GLUCOTROL XL 10 MG TB24 (GLIPIZIDE) Take 1 tablet by mouth once a day Removed medication of GLUCOTROL XL 5 MG  TB24 (GLIPIZIDE) take one by mouth daily Observations: Added new observation of LLIMPORTMEDS: completed (10/24/2007 8:41)      Prior Medications: LOPID 600 MG  TABS (GEMFIBROZIL) take one by mouth daily MAVIK 2 MG  TABS (TRANDOLAPRIL) take one by mouth daily ESTRACE 0.1 MG/GM  CREA (ESTRADIOL) take one half by mouth daily CALCIUM LACTATE 750 MG  TABS (CALCIUM LACTATE) take two by mouth daily TETRACYCLINE HCL 500 MG  CAPS (TETRACYCLINE HCL) take one by mouth daily VITAMIN B-12 CR 1000 MCG  TBCR (CYANOCOBALAMIN) one injection every 8 weeks VESICARE 10 MG  TABS (SOLIFENACIN SUCCINATE) take one by mouth daily ANUSOL-HC 25 MG  SUPP (HYDROCORTISONE ACETATE) 1 pr at bedtime for 7-14 days VITAMIN D 1000 IU ()  DIFLUCAN 150 MG  TABS (FLUCONAZOLE) 1 by mouth times one as needed yeast infection GLUCOTROL XL 10 MG TB24 (GLIPIZIDE) Take 1 tablet by mouth once a day

## 2010-09-19 NOTE — Consult Note (Signed)
Summary: Anthonette Legato MD  Anthonette Legato MD   Imported By: Phillis Knack 09/26/2009 14:27:27  _____________________________________________________________________  External Attachment:    Type:   Image     Comment:   External Document

## 2010-09-19 NOTE — Assessment & Plan Note (Signed)
Summary: Renee [name] , lower legs swollen/bir   Vital Signs:  Patient profile:   77 year old female Height:      63 inches Weight:      219 pounds BMI:     38.93 Temp:     97.6 degrees F oral Pulse rate:   80 / minute Pulse rhythm:   regular BP sitting:   150 / 64  (left arm) Cuff size:   large  Vitals Entered By: Gwinda Maine (Jan 07, 2009 8:31 AM) CC: Renee swelling of right ankle and leg   History of Present Illness: here for edema poss due to inc actos  swelling is not severe - is worse in her R leg  no shortness of breath or other symptoms   sugar is generally running in the 130s  more fruits and veg lately colonosc 09  some constaipation- occ stool softener , at other times diarrhea  did have stomach flu a while back-- this is better  can year yogurt if that would help   was in for uti symptoms neg ua then cx with mult bact - ? contaminated   having gas pains in lower abd  thinks this is coming from her diet - much more cabbage/squash/cuc/tomato   no urinary symptoms  lost 4 lb is out in the garden a lot - much more active     Allergies: 1)  ! Voltaren  Review of Systems General:  Denies fatigue, fever, loss of appetite, and malaise. Eyes:  Denies blurring and eye pain. CV:  Denies chest pain or discomfort, palpitations, and shortness of breath with exertion. Resp:  Denies cough and wheezing. GI:  Complains of change in bowel habits and gas; denies abdominal pain, bloody stools, dark tarry stools, indigestion, nausea, and vomiting. GU:  Denies dysuria, hematuria, and urinary frequency. MS:  Complains of low back pain; back pain is gradually improving . Derm:  Denies poor wound healing and rash. Neuro:  Denies numbness and tingling. Endo:  Denies excessive thirst and heat intolerance.  Physical Exam  General:  overweight but generally well appearing  Head:  normocephalic, atraumatic, and no abnormalities observed.   Eyes:  vision grossly  intact, pupils equal, pupils round, and pupils reactive to light.  no conjunctival pallor, injection or icterus  Mouth:  MMM Neck:  supple with full rom and no masses or thyromegally, no JVD or carotid bruit  Lungs:  Normal respiratory effort, chest expands symmetrically. Lungs are clear to auscultation, no crackles or wheezes. Heart:  Normal rate and regular rhythm. S1 and S2 normal without gallop, murmur, click, rub or other extra sounds. Abdomen:  no suprapubic tenderness or fullness felt  Msk:  No deformity or scoliosis noted of thoracic or lumbar spine.  poor rom LS Pulses:  R and L carotid,radial,femoral,dorsalis pedis and posterior tibial pulses are full and equal bilaterally Extremities:  trace left pedal edema and trace right pedal edema.   Neurologic:  sensation intact to light touch, gait normal, and DTRs symmetrical and normal.   Skin:  Intact without suspicious lesions or rashes tanned with lentigos Cervical Nodes:  No lymphadenopathy noted Inguinal Nodes:  No significant adenopathy Psych:  normal affect, talkative and pleasant   Diabetes Management Exam:    Foot Exam (with socks and/or shoes not present):       Sensory-Pinprick/Light touch:          Left medial foot (L-4): normal  Left dorsal foot (L-5): normal          Left lateral foot (S-1): normal          Right medial foot (L-4): normal          Right dorsal foot (L-5): normal          Right lateral foot (S-1): normal       Sensory-Monofilament:          Left foot: normal          Right foot: normal       Inspection:          Left foot: normal          Right foot: normal       Nails:          Left foot: normal          Right foot: normal   Impression & Recommendations:  Problem # 1:  EDEMA (ICD-782.3) Assessment Deteriorated suspect due to increase in actos - bilat/worse in R without other symptoms disc this side eff with pt- reasurring no sob she states that she can tolerate swelling  will  drinkmore water,elevate feet, and consider supp hose in winter if worse or any sob adv to call  Problem # 2:  DIABETES MELLITUS, TYPE II (ICD-250.00) Assessment: Improved  overall imp with actos 30  also wt loss and inc activity will continue this unless side eff worsen labs june Renee 3 mo  Her updated medication list for this problem includes:    Mavik 4 Mg Tabs (Trandolapril) .Marland Kitchen... 1 by mouth once daily    Actos 30 Mg Tabs (Pioglitazone hcl) .Marland Kitchen... 1 by mouth once daily  Labs Reviewed: Creat: 1.1 (11/05/2008)     Last Eye Exam: normal (11/24/2008) Reviewed HgBA1c results: 7.0 (11/05/2008)  7.1 (06/29/2008)  Problem # 3:  UTI (ICD-599.0) Assessment: Improved cx showed contaminant  rev symptoms- doubt uti disc urinary sympt to watch for  Her updated medication list for this problem includes:    Tetracycline Hcl 500 Mg Caps (Tetracycline hcl) .Marland Kitchen... Take one by mouth daily    Vesicare 10 Mg Tabs (Solifenacin succinate) .Marland Kitchen... Take one by mouth daily  Problem # 4:  INTESTINAL GAS (ICD-787.3) Assessment: New increase with recent inc in fruit/veg/and gastrointeritis in recent past disc lower residue diet - and grad inc fiber will try low sugar yogurt for probiotic  colonosc utd update if not imp  Complete Medication List: 1)  Lopid 600 Mg Tabs (Gemfibrozil) .... Take one by mouth daily 2)  Mavik 4 Mg Tabs (Trandolapril) .Marland Kitchen.. 1 by mouth once daily 3)  Estrace 1 Mg Tabs (Estradiol) .... 1/2 tab by mouth once daily 4)  Calcium Lactate 750 Mg Tabs (Calcium lactate) .... Take two by mouth daily 5)  Tetracycline Hcl 500 Mg Caps (Tetracycline hcl) .... Take one by mouth daily 6)  Vitamin B-12 Cr 1000 Mcg Tbcr (Cyanocobalamin) .... One injection every 8 weeks 7)  Vesicare 10 Mg Tabs (Solifenacin succinate) .... Take one by mouth daily 8)  Vitamin D 1000 Iu  9)  Actos 30 Mg Tabs (Pioglitazone hcl) .Marland Kitchen.. 1 by mouth once daily 10)  Centrum Tabs (Multiple vitamins-minerals) .... Take 1  tablet by mouth once a day  Patient Instructions: 1)  cancel Renee with me in june (keep lab appt) 2)  follow up with me in about 3 months 3)  continue current medicines 4)  if swelling worsens or becomes intolerable or if  any shortness of breath- stop the actos and call me  5)  for several days- back off the fiber in diet- then slowly re-introduce it  6)  try small serving of low fat/ low sugar yogurt per day

## 2010-09-19 NOTE — Miscellaneous (Signed)
Summary: med mail in refils  Clinical Lists Changes  Medications: Changed medication from ACTOS 15 MG TABS (PIOGLITAZONE HCL) 1 by mouth each am to ACTOS 15 MG TABS (PIOGLITAZONE HCL) 1 by mouth each am - Signed Rx of LOPID 600 MG  TABS (GEMFIBROZIL) take one by mouth daily;  #90 x 3;  Signed;  Entered by: [name] MD;  Authorized by: [name] MD;  Method used: Print then Give to Patient Rx of ACTOS 15 MG TABS (PIOGLITAZONE HCL) 1 by mouth each am;  #90 x 3;  Signed;  Entered by: [name] MD;  Authorized by: [name] MD;  Method used: Print then Give to Patient Rx of ESTRACE 0.1 MG/GM  CREA (ESTRADIOL) take one half by mouth daily;  #3 months x 3;  Signed;  Entered by: [name] MD;  Authorized by: Carmell Austria Rodrigues Urbanek MD;  Method used: Print then Give to Patient Rx of [place] 4 MG TABS (TRANDOLAPRIL) 1 by mouth once daily;  #90 x 3;  Signed;  Entered by: [name] MD;  Authorized by: [name] MD;  Method used: Print then Give to Patient    Prescriptions: [facility] 4 MG TABS (TRANDOLAPRIL) 1 by mouth once daily  #90 x 3   Entered and Authorized by:   [name] MD   Signed by:   [name] MD on 10/29/2008   Method used:   Print then Give to Patient   RxID:   [id number] ESTRACE 0.1 MG/GM  CREA (ESTRADIOL) take one half by mouth daily  #3 months x 3   Entered and Authorized by:   [name] MD   Signed by:   [name] MD on 10/29/2008   Method used:   Print then Give to Patient   RxID:   [id number] ACTOS 15 MG TABS (PIOGLITAZONE HCL) 1 by mouth each am  #90 x 3   Entered and Authorized by:   [name] MD   Signed by:   [name] MD on 10/29/2008   Method used:   Print then Give to Patient   RxID:   [id number] LOPID 600 MG  TABS (GEMFIBROZIL) take one by mouth daily  #90 x 3   Entered and Authorized by:   [name] MD   Signed by:   [name] MD on 10/29/2008   Method used:   Print then  Give to Patient   RxID:   [id number]

## 2010-09-19 NOTE — Miscellaneous (Signed)
Summary: Intradermals, Scratch Tests, Discharge Instructions/[facility]  Intradermals, Scratch Tests, Discharge Instructions/[facility]   Imported By: Laural Benes 08/11/2009 14:05:11  _____________________________________________________________________  External Attachment:    Type:   Image     Comment:   External Document

## 2010-09-20 ENCOUNTER — Encounter: Payer: Self-pay | Admitting: Physical Medicine & Rehabilitation

## 2010-09-21 NOTE — Progress Notes (Signed)
Summary: refill requests from [facility]  Phone Note Refill Request Message from:  Fax from Pharmacy  Refills Requested: Medication #1:  ZOLOFT 50 MG TABS once daily  Medication #2:  METOPROLOL SUCCINATE 25 MG XR24H-TAB 1/2 tablet twice daily  Medication #3:  PIN NEEDLES FOR LANTUS SOLOSTAR to inject once daily as directed Faxed request from [facility] is on your desk.  Request is for metoprolol tartrate, chart says succinate.  Initial call taken by: Marty Heck CMA, [employer],  August 01, 2010 8:20 AM  Follow-up for Phone Call        form done and in nurse in box  Follow-up by: [name] MD,  August 01, 2010 1:41 PM  Additional Follow-up for Phone Call Additional follow up Details #1::        Completed form faxed to [phone] as isntructed.Ozzie Hoyle LPN  December 13, [id number] 2:31 PM     New/Updated Medications: METOPROLOL SUCCINATE 25 MG XR24H-TAB (METOPROLOL SUCCINATE) 1/2 tablet twice daily ZOLOFT 50 MG TABS (SERTRALINE HCL) 1 by mouth once daily Prescriptions: PIN NEEDLES FOR LANTUS SOLOSTAR to inject once daily as directed  #90 x 3   Entered and Authorized by:   [name] MD   Signed by:   Ozzie Hoyle LPN on [id number]   Method used:   Historical   [id number] METOPROLOL SUCCINATE 25 MG XR24H-TAB (METOPROLOL SUCCINATE) 1/2 tablet twice daily  #90 x 3   Entered and Authorized by:   [name] MD   Signed by:   Ozzie Hoyle LPN on [id number]   Method used:   Historical   [id number] ZOLOFT 50 MG TABS (SERTRALINE HCL) 1 by mouth once daily  #90 x 3   Entered and Authorized by:   [name] MD   Signed by:   Ozzie Hoyle LPN on [id number]   Method used:   Historical   [id number]

## 2010-09-21 NOTE — Progress Notes (Signed)
Summary: forms for diabetic supplies  Phone Note From Pharmacy   Caller: [facility] Summary of Call: Forms from [facility], for diabetic supplies, is on your shelf.                Marty Heck CMA, AAMA  September 07, 2010 8:28 AM   Follow-up for Phone Call        form done and in nurse in box  please scan it- thanks  Follow-up by: [name] MD,  September 07, 2010 9:07 AM  Additional Follow-up for Phone Call Additional follow up Details #1::        Form faxed and put up front for scanning. Additional Follow-up by: Arnetha Courser CMA Deborra Medina),  September 07, 2010 10:16 AM

## 2010-09-21 NOTE — Assessment & Plan Note (Signed)
Summary: 3MTH F/U AFTER LABS / LFW   Vital Signs:  Patient profile:   77 year old female Height:      63 inches Weight:      193.75 pounds BMI:     34.45 Temp:     97.6 degrees F oral Pulse rate:   64 / minute Pulse rhythm:   regular BP sitting:   120 / 68  (left arm) Cuff size:   large  Vitals Entered By: Ozzie Hoyle LPN (January  6, [id number] 8:44 AM) CC: 3 month f/u after labs   History of Present Illness: here for f/u of DM and HTN and lipids and renal insuff   c/o of decreased appetite and cannot sleep  has been feeling down in the dumps -- husband is in really bad shape  is feeling both anxious and depression  her husband went to a counselor yesterday and she went with him  she will continue couples counseling with him -- will be doing outpt therapy PT with him for balance    AIC is 5.7-- down from 6.8- very well controlled -- one or 2 times sugar went under 70 at one time it was high 205 -- after a country ham biscuit    wt is down 14 lb-- she has no appetite  poss from depression or renal insuff taking care of husb with severe mental illness is taking its toll also dealing with her own limitations with cva   HTN in good control 120/68 hydralazine was inc by renal and doing well with that   cr is stable at 1.5  she did follow up with the renal doctor -- inc her bp med -- inc her hydralazine   lipids trig 124 and HDL 29 and LDL 118 (was in 120s)- on lopid  aware we need to get LDL downmore will disc at next visit diet is fair   Allergies: 1)  ! Voltaren 2)  ! * Actos  Past History:  Past Medical History: Last updated: 07/05/2010 Diabetes mellitus, type II Hypertension Osteoarthritis Hyperlipidemia angioedema was poss from voltaren vitamin b12 deficiency 4/08 LVH and atrial enlargement by echo in past with nl EF degenerative disk dz osteopenia  pruritis  renal insufficiency CVA small vessel- sobcortical (in [place] trial ) with Dr Leonie Man  ortho -- Dr  Tonita Cong [facility] - Dr Amedeo Plenty   Past Surgical History: Last updated: 06/11/2010 Cataract extraction Hysterectomy, BSO- fibroids LVH-  EF 60% Echo- bi-atrial enlargement, mild (05/1995) Colonoscopy- no polyps (1995),   divertic., ? polyp (06/1998)  , diverticulosis (08/2003) L4- L5 disk disease- epidural inj. 12/1998 Dexa- osteopenia (04/2002) , stable (10/20050 Knee surgery- arthroscope spinal decomression sx 8/09 (12/09) colonoscopy polyp/ adenomatous - re check 5 y  (flex sig done 4/10 was nl -- so rec 5 y f/u) 4/10 dexa stable osteopenia  10/11 admit CVA- small vessel  Family History: Last updated: 02/17/2010 sister CAD, brain tumor with hemorrhage  brother died of copd   Social History: Last updated: 05/23/2010 husband is ill- she cares for him  (he has severe anx and depression in and out of nsg homes) non smoker  no alcohol  works a lot outside in garden  Risk Factors: Smoking Status: never (11/27/2006)  Review of Systems General:  Complains of fatigue and loss of appetite; denies fever and malaise. Eyes:  Denies blurring and eye irritation. CV:  Denies chest pain or discomfort, lightheadness, and palpitations. Resp:  Denies shortness of breath. GI:  Complains of loss  of appetite; denies abdominal pain, change in bowel habits, indigestion, and nausea. GU:  Denies dysuria and urinary frequency. MS:  Denies muscle aches. Derm:  Denies poor wound healing and rash. Neuro:  Denies headaches, numbness, and tingling. Psych:  Complains of depression; denies panic attacks and sense of great danger. Endo:  Denies excessive thirst and excessive urination. Heme:  Denies abnormal bruising and bleeding.  Physical Exam  General:  overweight but generally well appearing  some wt loss noted  Head:  normocephalic, atraumatic, and no abnormalities observed.   Eyes:  vision grossly intact, pupils equal, pupils round, and pupils reactive to light.  no conjunctival pallor, injection or  icterus  Mouth:  pharynx pink and moist.   Neck:  supple with full rom and no masses or thyromegally, no JVD or carotid bruit  Chest Wall:  No deformities, masses, or tenderness noted. Lungs:  Normal respiratory effort, chest expands symmetrically. Lungs are clear to auscultation, no crackles or wheezes. Heart:  Normal rate and regular rhythm. S1 and S2 normal without gallop, murmur, click, rub or other extra sounds. Abdomen:  Bowel sounds positive,abdomen soft and non-tender without masses, organomegaly or hernias noted. no renal bruits  Msk:  No deformity or scoliosis noted of thoracic or lumbar spine.  no acute joint changes  Pulses:  R and L carotid,radial,femoral,dorsalis pedis and posterior tibial pulses are full and equal bilaterally Extremities:  No clubbing, cyanosis, edema, or deformity noted with normal full range of motion of all joints.   Neurologic:  improved speech and mobility today  getting stronger  Skin:  Intact without suspicious lesions or rashes Cervical Nodes:  No lymphadenopathy noted Psych:  depressed affect tearful at times tired and dismotivated  no SI  Diabetes Management Exam:    Foot Exam (with socks and/or shoes not present):       Sensory-Pinprick/Light touch:          Left medial foot (L-4): normal          Left dorsal foot (L-5): normal          Left lateral foot (S-1): normal          Right medial foot (L-4): normal          Right dorsal foot (L-5): normal          Right lateral foot (S-1): normal       Sensory-Monofilament:          Left foot: normal          Right foot: normal       Inspection:          Left foot: normal          Right foot: normal       Nails:          Left foot: normal          Right foot: normal   Impression & Recommendations:  Problem # 1:  DEPRESSION (ICD-311) Assessment New after cva and caring for husb with severe mental illness will continue counseling  change zoloft to remeron (renal dosing)  spent 25 minutes  face to face time with pt , over 50% of which was spent on counseling and coordination of care  disc coping skills/ tx options/ symptoms / poss side eff in detial pt aware to stop med if more depressed or suicidal f/u 4-6 wk  The following medications were removed from the medication list:    Zoloft 50 Mg Tabs (Sertraline  hcl) ..... 1 by mouth once daily Her updated medication list for this problem includes:    Alprazolam 0.25 Mg Tabs (Alprazolam) ..... One tablet every 8 hours as needed    Remeron 15 Mg Tabs (Mirtazapine) .Marland Kitchen... 1/2 by mouth at bedtime  Problem # 2:  CVA WITH LEFT HEMIPARESIS (ICD-438.20) Assessment: Improved continues to improve with speech and mobility commended on this  Her updated medication list for this problem includes:    Aspirin 325 Mg Tabs (Aspirin) ..... One tablet daily  Problem # 3:  RENAL INSUFFICIENCY (ICD-588.9) Assessment: Unchanged continues renal f/u  bp in better control with inc hydralazine continue to montior rev labs  Problem # 4:  DIABETES MELLITUS, TYPE II (ICD-250.00) Assessment: Improved  this is better so will cut the glucotrol xl to 5 keep watching diet update if low sugars The following medications were removed from the medication list:    Glucotrol Xl 10 Mg Xr24h-tab (Glipizide) .Marland Kitchen... Take 1 tablet by mouth once a day    Glucotrol 10 Mg Tabs (Glipizide) ..... One tablet daily Her updated medication list for this problem includes:    Mavik 4 Mg Tabs (Trandolapril) .Marland Kitchen... 1 by mouth once daily    Lantus Solostar 100 Unit/ml Soln (Insulin glargine) ..... Inject 10 units once a day or as directed    Aspirin 325 Mg Tabs (Aspirin) ..... One tablet daily    Glucotrol Xl 5 Mg Xr24h-tab (Glipizide) .Marland Kitchen... 1 by mouth once daily  Labs Reviewed: Creat: 1.5 (08/22/2010)     Last Eye Exam: diabetic retinopathy (12/08/2009) Reviewed HgBA1c results: 5.7 (08/22/2010)  6.8 (05/16/2010)  Problem # 5:  HYPERTENSION (ICD-401.9) Assessment:  Improved  very good control -with inc hydralazine Her updated medication list for this problem includes:    Mavik 4 Mg Tabs (Trandolapril) .Marland Kitchen... 1 by mouth once daily    Amlodipine Besylate 10 Mg Tabs (Amlodipine besylate) .Marland Kitchen... Take 1 tablet by mouth once a day    Hydralazine Hcl 25 Mg Tabs (Hydralazine hcl) .Marland Kitchen... Take one tablet by mouth three times a day    Metoprolol Succinate 25 Mg Xr24h-tab (Metoprolol succinate) .Marland Kitchen... 1/2 tablet twice daily    Hydralazine Hcl 50 Mg Tabs (Hydralazine hcl) ..... One tablet by mouth three times a day.  BP today: 120/68 Prior BP: 120/78 (07/05/2010)  Labs Reviewed: K+: 4.1 (08/22/2010) Creat: : 1.5 (08/22/2010)   Chol: 172 (08/22/2010)   HDL: 29.50 (08/22/2010)   LDL: 118 (08/22/2010)   TG: 124.0 (08/22/2010)  Problem # 6:  HYPERLIPIDEMIA (ICD-272.4) Assessment: Unchanged  trig are imp but LDL creeping up  in light of cva - aiming for LDL 70 or less disc this as well as low sat fat diet  will address further at 1 mo f/u-- may have to consider carefully adding a statin Her updated medication list for this problem includes:    Lopid 600 Mg Tabs (Gemfibrozil) .Marland Kitchen... Take one by mouth daily  Labs Reviewed: SGOT: 19 (08/22/2010)   SGPT: 17 (08/22/2010)   HDL:29.50 (08/22/2010), 34.00 (05/16/2010)  LDL:118 (08/22/2010), 123 (05/16/2010)  Chol:172 (08/22/2010), 185 (05/16/2010)  Trig:124.0 (08/22/2010), 138.0 (05/16/2010)  Problem # 7:  B12 DEFICIENCY (ICD-266.2) Assessment: Unchanged shot on schedule today Orders: Vit B12 1000 mcg (J3420) Admin of Therapeutic Inj  intramuscular or subcutaneous [id number])  Complete Medication List: 1)  Lopid 600 Mg Tabs (Gemfibrozil) .... Take one by mouth daily 2)  Mavik 4 Mg Tabs (Trandolapril) .Marland Kitchen.. 1 by mouth once daily 3)  Vitamin B-12 Cr 1000  Mcg Tbcr (Cyanocobalamin) .... One injection every 8 weeks 4)  Vitamin D 1000 Iu  .... One daily 5)  Centrum Tabs (Multiple vitamins-minerals) .... Take 1 tablet by mouth  once a day 6)  Amlodipine Besylate 10 Mg Tabs (Amlodipine besylate) .... Take 1 tablet by mouth once a day 7)  Hydralazine Hcl 25 Mg Tabs (Hydralazine hcl) .... Take one tablet by mouth three times a day 8)  Lantus Solostar 100 Unit/ml Soln (Insulin glargine) .... Inject 10 units once a day or as directed 9)  Pin Needles For Lantus Solostar  .... To inject once daily as directed 10)  Metoprolol Succinate 25 Mg Xr24h-tab (Metoprolol succinate) .... 1/2 tablet twice daily 11)  Oscal 500/200 D-3 500-200 Mg-unit Tabs (Calcium carbonate-vitamin d) .... One tablet daily 12)  Aspirin 325 Mg Tabs (Aspirin) .... One tablet daily 13)  Alprazolam 0.25 Mg Tabs (Alprazolam) .... One tablet every 8 hours as needed 14)  Oxybutynin Chloride 10 Mg Xr24h-tab (Oxybutynin chloride) .Marland Kitchen.. 1 by mouth once daily 15)  Hydralazine Hcl 50 Mg Tabs (Hydralazine hcl) .... One tablet by mouth three times a day. 16)  Advil 200 Mg Tabs (Ibuprofen) .... Otc as directed. 17)  Benadryl 25 Mg Tabs (Diphenhydramine hcl) .... Otc as directed. 18)  Claritin 10 Mg Tabs (Loratadine) .... Otc as directed. 19)  Remeron 15 Mg Tabs (Mirtazapine) .... 1/2 by mouth at bedtime 20)  Glucotrol Xl 5 Mg Xr24h-tab (Glipizide) .Marland Kitchen.. 1 by mouth once daily  Patient Instructions: 1)  cut zoloft in 1/2 and take 1/2 pill daily for 1 week and then stop it  2)  start the remeron tonight - 1/2 of a 15 mg pill -- this should help mood and sleep and appetite  3)  I am changing your glucotrol from 10 to 5 mg once daily -- since sugar is better  4)  you can get debrox ear solution over the counter and use as directed once weekly for ear wax  5)  follow up with me in about 4-6 weeks for the sleep and mood problems  Prescriptions: GLUCOTROL XL 5 MG XR24H-TAB (GLIPIZIDE) 1 by mouth once daily  #30 x 11   Entered and Authorized by:   [name] MD   Signed by:   [name] MD on 08/25/2010   Method used:   Electronically to        [facility] (retail)       [address]       [place], [place]  [address]       Ph: [phone]       Fax: [phone]   RxID:   [id number] REMERON 15 MG TABS (MIRTAZAPINE) 1/2 by mouth at bedtime  #15 x 11   Entered and Authorized by:   [name] MD   Signed by:   [name] MD on 08/25/2010   Method used:   Electronically to        [facility] (retail)       [address]       [place], [place]  [address]       Ph: [phone]       Fax: [phone]   RxID:   [id number]    Medication Administration  Injection # 1:    Medication: Vit B12 1000 mcg    Diagnosis: B12 DEFICIENCY (ICD-266.2)    Route: IM    Site: L deltoid    Exp Date: 05/20/2012    Lot #:  [place]: Radiographer, therapeutic    Patient tolerated injection without complications    Given by: Ozzie Hoyle LPN (January  6, [id number] 9:00 AM)  Orders Added: 1)  Vit B12 1000 mcg [J3420] 2)  Admin of Therapeutic Inj  intramuscular or subcutaneous [[id number]] 3)  Est. Patient Level V [id number]    Current Allergies (reviewed today): ! VOLTAREN ! * ACTOS

## 2010-09-21 NOTE — Medication Information (Signed)
Summary: Sertraline & Metoprolol/[facility]  Sertraline & Metoprolol/[facility]   Imported By: Edmonia James 08/07/2010 12:32:29  _____________________________________________________________________  External Attachment:    Type:   Image     Comment:   External Document

## 2010-09-21 NOTE — Consult Note (Signed)
Summary: [facility]   Imported By: Edmonia James 08/22/2010 08:46:44  _____________________________________________________________________  External Attachment:    Type:   Image     Comment:   External Document

## 2010-09-22 NOTE — Medication Information (Signed)
Summary: [facility]Physician Order  [facility]Physician Order   Imported By: Virgia Land 11/12/2008 14:24:40  _____________________________________________________________________  External Attachment:    Type:   Image     Comment:   External Document

## 2010-09-27 NOTE — Progress Notes (Signed)
Summary: refill request for furosemide  Phone Note Refill Request Message from:  Fax from Pharmacy  Refills Requested: Medication #1:  furosemide 20 mg Faxed form from [facility] is on your desk, this is not on med list.  Initial call taken by: Marty Heck CMA, [place],  September 19, 2010 11:37 AM  Follow-up for Phone Call        please ask pt about it - was on in past -- ? if renal doc took her off of it I will hold on to form  Follow-up by: [name] MD,  September 19, 2010 1:17 PM  Additional Follow-up for Phone Call Additional follow up Details #1::        Patient notified as instructed by telephone. [facility] said [facility] should not have requested refill.Ozzie Hoyle LPN  January 31, [id number] 4:37 PM   please let [facility] know we d/c this med I will put document in your IN box Additional Follow-up by: [name] MD,  September 19, 2010 5:02 PM    Additional Follow-up for Phone Call Additional follow up Details #2::    completed form faxed to [phone] with no longer on this med note on sheet.Ozzie Hoyle LPN  February  1, [id number] 9:29 AM

## 2010-10-02 ENCOUNTER — Other Ambulatory Visit: Payer: Self-pay | Admitting: Physical Medicine & Rehabilitation

## 2010-10-02 ENCOUNTER — Ambulatory Visit (HOSPITAL_COMMUNITY)
Admission: RE | Admit: 2010-10-02 | Discharge: 2010-10-02 | Disposition: A | Discharge status: Home / Self Care | Payer: Medicare Other | Source: Ambulatory Visit | Attending: Physical Medicine & Rehabilitation | Admitting: Physical Medicine & Rehabilitation

## 2010-10-02 ENCOUNTER — Ambulatory Visit: Payer: PRIVATE HEALTH INSURANCE | Admitting: Physical Medicine & Rehabilitation

## 2010-10-02 DIAGNOSIS — M25549 Pain in joints of unspecified hand: Secondary | ICD-10-CM

## 2010-10-02 DIAGNOSIS — M79609 Pain in unspecified limb: Secondary | ICD-10-CM | POA: Insufficient documentation

## 2010-10-02 DIAGNOSIS — G8110 Spastic hemiplegia affecting unspecified side: Secondary | ICD-10-CM

## 2010-10-02 DIAGNOSIS — M19049 Primary osteoarthritis, unspecified hand: Secondary | ICD-10-CM | POA: Insufficient documentation

## 2010-10-02 DIAGNOSIS — R52 Pain, unspecified: Secondary | ICD-10-CM

## 2010-10-02 DIAGNOSIS — I69959 Hemiplegia and hemiparesis following unspecified cerebrovascular disease affecting unspecified side: Secondary | ICD-10-CM | POA: Insufficient documentation

## 2010-10-03 ENCOUNTER — Encounter: Payer: Self-pay | Admitting: Family Medicine

## 2010-10-03 ENCOUNTER — Ambulatory Visit (INDEPENDENT_AMBULATORY_CARE_PROVIDER_SITE_OTHER): Payer: Medicare Other | Admitting: Family Medicine

## 2010-10-03 DIAGNOSIS — F329 Major depressive disorder, single episode, unspecified: Secondary | ICD-10-CM

## 2010-10-03 DIAGNOSIS — F3289 Other specified depressive episodes: Secondary | ICD-10-CM

## 2010-10-03 DIAGNOSIS — E119 Type 2 diabetes mellitus without complications: Secondary | ICD-10-CM

## 2010-10-03 LAB — HM DIABETES FOOT EXAM

## 2010-10-05 NOTE — Medication Information (Signed)
Summary: Refill Request --   Refill Request --   Imported By: Jamelle Haring 09/27/2010 08:42:10  _____________________________________________________________________  External Attachment:    Type:   Image     Comment:   External Document

## 2010-10-12 ENCOUNTER — Encounter: Payer: Medicare Other | Attending: Physical Medicine & Rehabilitation

## 2010-10-12 ENCOUNTER — Ambulatory Visit: Payer: PRIVATE HEALTH INSURANCE | Admitting: Physical Medicine & Rehabilitation

## 2010-10-12 DIAGNOSIS — G5610 Other lesions of median nerve, unspecified upper limb: Secondary | ICD-10-CM

## 2010-10-17 NOTE — Assessment & Plan Note (Signed)
Summary: 4-6 WEEK FOLLOW-UP SLEEP AND MOOD PROBLEMS   Vital Signs:  Patient profile:   77 year old female Height:      63 inches Weight:      194.25 pounds BMI:     34.53 Temp:     97.6 degrees F oral Pulse rate:   64 / minute Pulse rhythm:   regular BP sitting:   110 / 66  (left arm) Cuff size:   large  Vitals Entered By: Ozzie Hoyle LPN (February 14, [id number] 2:15 PM) CC: 4-6 week f/u sleep and mood problems   History of Present Illness: last time she was here she was way down in the dumbs and not sleeping or eating  changed her zoloft to remeron  thinks her mood is overall better  wt is up a pound - apptetite is about the same  is making herself eat small meals  is sleeping ok now  is on renal dose    husband is home but wants to stay in bed all the time -- severe depression - in and out of hospital  she has to do everything for him  considering ECT for that  they are both going to counseling in [place] at [facility] where he has his psychiatry  that is helping her lean skills to deal with him    her L hand is sore and ? if she fell on it or other injury  is testing for carpal tunnel - will keep me updated   Allergies: 1)  ! Voltaren 2)  ! * Actos  Past History:  Past Surgical History: Last updated: 06/11/2010 Cataract extraction Hysterectomy, BSO- fibroids LVH-  EF 60% Echo- bi-atrial enlargement, mild (05/1995) Colonoscopy- no polyps (1995),   divertic., ? polyp (06/1998)  , diverticulosis (08/2003) L4- L5 disk disease- epidural inj. 12/1998 Dexa- osteopenia (04/2002) , stable (10/20050 Knee surgery- arthroscope spinal decomression sx 8/09 (12/09) colonoscopy polyp/ adenomatous - re check 5 y  (flex sig done 4/10 was nl -- so rec 5 y f/u) 4/10 dexa stable osteopenia  10/11 admit CVA- small vessel  Family History: Last updated: 02/17/2010 sister CAD, brain tumor with hemorrhage  brother died of copd   Social History: Last updated:  05/23/2010 husband is ill- she cares for him  (he has severe anx and depression in and out of nsg homes) non smoker  no alcohol  works a lot outside in garden  Risk Factors: Smoking Status: never (11/27/2006)  Past Medical History: Diabetes mellitus, type II Hypertension Osteoarthritis Hyperlipidemia angioedema was poss from voltaren vitamin b12 deficiency 4/08 LVH and atrial enlargement by echo in past with nl EF degenerative disk dz osteopenia  pruritis  renal insufficiency CVA small vessel- sobcortical (in [place] trial ) with Dr Leonie Man  ortho -- Dr Tonita Cong GI - Dr Amedeo Plenty  psych- claudia Mcoy and Truett Perna   Review of Systems General:  Complains of fatigue and loss of appetite; denies malaise; sleep is much much better . Eyes:  Denies blurring and eye irritation. CV:  Denies chest pain or discomfort, lightheadness, and palpitations. Resp:  Denies cough and wheezing. GI:  Denies abdominal pain, nausea, and vomiting. GU:  Denies dysuria. MS:  Complains of joint pain; denies cramps. Derm:  Denies rash. Neuro:  Complains of numbness and tingling; denies falling down and headaches. Psych:  Complains of anxiety, depression, and easily tearful; denies panic attacks, sense of great danger, and suicidal thoughts/plans. Endo:  Denies cold intolerance and heat intolerance. Heme:  Denies abnormal bruising and bleeding.  Physical Exam  General:  overweight but generally well appearing  some wt loss noted  Head:  normocephalic, atraumatic, and no abnormalities observed.   Eyes:  vision grossly intact, pupils equal, pupils round, and pupils reactive to light.  no conjunctival pallor, injection or icterus  Mouth:  pharynx pink and moist.   Neck:  supple with full rom and no masses or thyromegally, no JVD or carotid bruit  Chest Wall:  No deformities, masses, or tenderness noted. Lungs:  Normal respiratory effort, chest expands symmetrically. Lungs are clear to auscultation, no  crackles or wheezes. Heart:  Normal rate and regular rhythm. S1 and S2 normal without gallop, murmur, click, rub or other extra sounds. Extremities:  No clubbing, cyanosis, edema, or deformity noted with normal full range of motion of all joints.   Neurologic:  improved speech and mobility today  getting stronger  no tremor  Skin:  Intact without suspicious lesions or rashes Cervical Nodes:  No lymphadenopathy noted Psych:  improved affect more talkative and alert with better eye contact  Diabetes Management Exam:    Foot Exam (with socks and/or shoes not present):       Sensory-Pinprick/Light touch:          Left medial foot (L-4): normal          Left dorsal foot (L-5): normal          Left lateral foot (S-1): normal          Right medial foot (L-4): normal          Right dorsal foot (L-5): normal          Right lateral foot (S-1): normal       Sensory-Monofilament:          Left foot: normal          Right foot: normal       Inspection:          Left foot: normal          Right foot: normal       Nails:          Left foot: normal          Right foot: normal   Impression & Recommendations:  Problem # 1:  DEPRESSION (ICD-311) Assessment Improved this is overall markedly better with switch from zoloft to remeron  appetite is still poor - but pt is eatin DM diet (and this could be also multifactorial from stroke and renal insuff) continue this dose continue counseling  f/u 3 mo The following medications were removed from the medication list:    Alprazolam 0.25 Mg Tabs (Alprazolam) ..... One tablet every 8 hours as needed Her updated medication list for this problem includes:    Remeron 15 Mg Tabs (Mirtazapine) .Marland Kitchen... 1/2 by mouth at bedtime  Problem # 2:  DIABETES MELLITUS, TYPE II (ICD-250.00) Assessment: Improved  good control now without hypoglycemia- after dec med dose lab and f/u 3 mo  Her updated medication list for this problem includes:    Mavik 4 Mg Tabs  (Trandolapril) .Marland Kitchen... 1 by mouth once daily    Lantus Solostar 100 Unit/ml Soln (Insulin glargine) ..... Inject 10 units once a day or as directed    Aspirin 325 Mg Tabs (Aspirin) ..... One tablet daily    Glucotrol Xl 5 Mg Xr24h-tab (Glipizide) .Marland Kitchen... 1 by mouth once daily  Labs Reviewed: Creat: 1.5 (08/22/2010)     Last Eye  Exam: diabetic retinopathy (12/08/2009) Reviewed HgBA1c results: 5.7 (08/22/2010)  6.8 (05/16/2010)  Complete Medication List: 1)  Lopid 600 Mg Tabs (Gemfibrozil) .... Take one by mouth daily 2)  Mavik 4 Mg Tabs (Trandolapril) .Marland Kitchen.. 1 by mouth once daily 3)  Vitamin B-12 Cr 1000 Mcg Tbcr (Cyanocobalamin) .... One injection every 8 weeks 4)  Vitamin D 1000 Iu  .... One daily 5)  Centrum Tabs (Multiple vitamins-minerals) .... Take 1 tablet by mouth once a day 6)  Amlodipine Besylate 10 Mg Tabs (Amlodipine besylate) .... Take 1 tablet by mouth once a day 7)  Lantus Solostar 100 Unit/ml Soln (Insulin glargine) .... Inject 10 units once a day or as directed 8)  Pin Needles For Lantus Solostar  .... To inject once daily as directed 9)  Metoprolol Succinate 25 Mg Xr24h-tab (Metoprolol succinate) .... 1/2 tablet twice daily 10)  Oscal 500/200 D-3 500-200 Mg-unit Tabs (Calcium carbonate-vitamin d) .... One tablet daily 11)  Aspirin 325 Mg Tabs (Aspirin) .... One tablet daily 12)  Oxybutynin Chloride 10 Mg Xr24h-tab (Oxybutynin chloride) .Marland Kitchen.. 1 by mouth once daily 13)  Hydralazine Hcl 50 Mg Tabs (Hydralazine hcl) .... One tablet by mouth three times a day. 14)  Advil 200 Mg Tabs (Ibuprofen) .... Otc as directed. 15)  Benadryl 25 Mg Tabs (Diphenhydramine hcl) .... Otc as directed. 16)  Claritin 10 Mg Tabs (Loratadine) .... Otc as directed. 17)  Remeron 15 Mg Tabs (Mirtazapine) .... 1/2 by mouth at bedtime 18)  Glucotrol Xl 5 Mg Xr24h-tab (Glipizide) .Marland Kitchen.. 1 by mouth once daily  Patient Instructions: 1)  I want to keep you on this dose of remeron for mood and sleep  2)  keep  eating small meals despite lack of appetite 3)  try to stay as active as you can be - I think you are doing very well after your stroke  4)  schedule fasting labs and then follow up in 3 months lipid/ast/alt/ AIC and renal 250.0 and 272  5)  no change in medicine   Orders Added: 1)  Est. Patient Level III [id number]    Current Allergies (reviewed today): ! VOLTAREN ! * ACTOS

## 2010-10-19 ENCOUNTER — Encounter: Payer: Self-pay | Admitting: Physical Medicine & Rehabilitation

## 2010-10-24 ENCOUNTER — Telehealth: Payer: Self-pay | Admitting: Family Medicine

## 2010-10-31 ENCOUNTER — Ambulatory Visit (INDEPENDENT_AMBULATORY_CARE_PROVIDER_SITE_OTHER): Payer: Medicare Other | Admitting: Family Medicine

## 2010-10-31 ENCOUNTER — Ambulatory Visit (INDEPENDENT_AMBULATORY_CARE_PROVIDER_SITE_OTHER)
Admission: RE | Admit: 2010-10-31 | Discharge: 2010-10-31 | Disposition: A | Discharge status: Home / Self Care | Payer: Medicare Other | Source: Ambulatory Visit | Attending: Family Medicine | Admitting: Family Medicine

## 2010-10-31 ENCOUNTER — Other Ambulatory Visit: Payer: Self-pay | Admitting: Family Medicine

## 2010-10-31 ENCOUNTER — Encounter: Payer: Self-pay | Admitting: Family Medicine

## 2010-10-31 DIAGNOSIS — R51 Headache: Secondary | ICD-10-CM

## 2010-10-31 DIAGNOSIS — R42 Dizziness and giddiness: Secondary | ICD-10-CM | POA: Insufficient documentation

## 2010-10-31 DIAGNOSIS — I69959 Hemiplegia and hemiparesis following unspecified cerebrovascular disease affecting unspecified side: Secondary | ICD-10-CM

## 2010-10-31 DIAGNOSIS — E538 Deficiency of other specified B group vitamins: Secondary | ICD-10-CM

## 2010-10-31 DIAGNOSIS — R519 Headache, unspecified: Secondary | ICD-10-CM | POA: Insufficient documentation

## 2010-10-31 LAB — GLUCOSE, CAPILLARY
Glucose-Capillary: 112 mg/dL — ABNORMAL HIGH (ref 70–99)
Glucose-Capillary: 135 mg/dL — ABNORMAL HIGH (ref 70–99)
Glucose-Capillary: 168 mg/dL — ABNORMAL HIGH (ref 70–99)
Glucose-Capillary: 171 mg/dL — ABNORMAL HIGH (ref 70–99)
Glucose-Capillary: 195 mg/dL — ABNORMAL HIGH (ref 70–99)
Glucose-Capillary: 96 mg/dL (ref 70–99)

## 2010-10-31 NOTE — Progress Notes (Signed)
Summary: regarding blood sugars  Phone Note Call from Patient Call back at Home Phone [phone] P PH     Caller: Patient Call For: [name] MD Summary of Call: Pt states her fasting blood sugars have been down in the 40's and 50's. She is taking glucotrol, one a day, and 10 units of lantus at night.  She has had some dizziness with sugars being low, but knows to eat something to get them back up.  It was 92 this morning.  Also, she says she mailed you a letter regarding her diabetic supplies- have you seen this?  Says she mailed this last week. Initial call taken by: Marty Heck CMA, [place],  October 24, 2010 2:44 PM  Follow-up for Phone Call        go ahead and stop the glucotrol  update me if her sugar is still too low I remember doing the DM supply form but it is not in the chart- please send for another one -thanks  Follow-up by: [name] MD,  October 24, 2010 4:56 PM  Additional Follow-up for Phone Call Additional follow up Details #1::        Patient notified as instructed by telephone. Pt will ck with diabetic supplier to see if got the form and if not will ask for another one.Ozzie Hoyle LPN  March  6, X33443 [id number]-GRM PM

## 2010-11-01 LAB — LIPID PANEL
Cholesterol: 148 mg/dL (ref 0–200)
HDL: 36 mg/dL — ABNORMAL LOW (ref >39)
LDL Cholesterol: 93 mg/dL (ref 0–99)
Total CHOL/HDL Ratio: 4.1 RATIO
Triglycerides: 96 mg/dL (ref <150)
VLDL: 19 mg/dL (ref 0–40)

## 2010-11-01 LAB — GLUCOSE, CAPILLARY
Glucose-Capillary: 104 mg/dL — ABNORMAL HIGH (ref 70–99)
Glucose-Capillary: 106 mg/dL — ABNORMAL HIGH (ref 70–99)
Glucose-Capillary: 108 mg/dL — ABNORMAL HIGH (ref 70–99)
Glucose-Capillary: 109 mg/dL — ABNORMAL HIGH (ref 70–99)
Glucose-Capillary: 111 mg/dL — ABNORMAL HIGH (ref 70–99)
Glucose-Capillary: 111 mg/dL — ABNORMAL HIGH (ref 70–99)
Glucose-Capillary: 112 mg/dL — ABNORMAL HIGH (ref 70–99)
Glucose-Capillary: 116 mg/dL — ABNORMAL HIGH (ref 70–99)
Glucose-Capillary: 118 mg/dL — ABNORMAL HIGH (ref 70–99)
Glucose-Capillary: 118 mg/dL — ABNORMAL HIGH (ref 70–99)
Glucose-Capillary: 124 mg/dL — ABNORMAL HIGH (ref 70–99)
Glucose-Capillary: 126 mg/dL — ABNORMAL HIGH (ref 70–99)
Glucose-Capillary: 126 mg/dL — ABNORMAL HIGH (ref 70–99)
Glucose-Capillary: 129 mg/dL — ABNORMAL HIGH (ref 70–99)
Glucose-Capillary: 129 mg/dL — ABNORMAL HIGH (ref 70–99)
Glucose-Capillary: 130 mg/dL — ABNORMAL HIGH (ref 70–99)
Glucose-Capillary: 130 mg/dL — ABNORMAL HIGH (ref 70–99)
Glucose-Capillary: 132 mg/dL — ABNORMAL HIGH (ref 70–99)
Glucose-Capillary: 132 mg/dL — ABNORMAL HIGH (ref 70–99)
Glucose-Capillary: 134 mg/dL — ABNORMAL HIGH (ref 70–99)
Glucose-Capillary: 136 mg/dL — ABNORMAL HIGH (ref 70–99)
Glucose-Capillary: 136 mg/dL — ABNORMAL HIGH (ref 70–99)
Glucose-Capillary: 136 mg/dL — ABNORMAL HIGH (ref 70–99)
Glucose-Capillary: 136 mg/dL — ABNORMAL HIGH (ref 70–99)
Glucose-Capillary: 138 mg/dL — ABNORMAL HIGH (ref 70–99)
Glucose-Capillary: 138 mg/dL — ABNORMAL HIGH (ref 70–99)
Glucose-Capillary: 138 mg/dL — ABNORMAL HIGH (ref 70–99)
Glucose-Capillary: 140 mg/dL — ABNORMAL HIGH (ref 70–99)
Glucose-Capillary: 142 mg/dL — ABNORMAL HIGH (ref 70–99)
Glucose-Capillary: 143 mg/dL — ABNORMAL HIGH (ref 70–99)
Glucose-Capillary: 143 mg/dL — ABNORMAL HIGH (ref 70–99)
Glucose-Capillary: 144 mg/dL — ABNORMAL HIGH (ref 70–99)
Glucose-Capillary: 145 mg/dL — ABNORMAL HIGH (ref 70–99)
Glucose-Capillary: 147 mg/dL — ABNORMAL HIGH (ref 70–99)
Glucose-Capillary: 150 mg/dL — ABNORMAL HIGH (ref 70–99)
Glucose-Capillary: 151 mg/dL — ABNORMAL HIGH (ref 70–99)
Glucose-Capillary: 153 mg/dL — ABNORMAL HIGH (ref 70–99)
Glucose-Capillary: 154 mg/dL — ABNORMAL HIGH (ref 70–99)
Glucose-Capillary: 155 mg/dL — ABNORMAL HIGH (ref 70–99)
Glucose-Capillary: 155 mg/dL — ABNORMAL HIGH (ref 70–99)
Glucose-Capillary: 157 mg/dL — ABNORMAL HIGH (ref 70–99)
Glucose-Capillary: 160 mg/dL — ABNORMAL HIGH (ref 70–99)
Glucose-Capillary: 162 mg/dL — ABNORMAL HIGH (ref 70–99)
Glucose-Capillary: 167 mg/dL — ABNORMAL HIGH (ref 70–99)
Glucose-Capillary: 167 mg/dL — ABNORMAL HIGH (ref 70–99)
Glucose-Capillary: 169 mg/dL — ABNORMAL HIGH (ref 70–99)
Glucose-Capillary: 170 mg/dL — ABNORMAL HIGH (ref 70–99)
Glucose-Capillary: 173 mg/dL — ABNORMAL HIGH (ref 70–99)
Glucose-Capillary: 176 mg/dL — ABNORMAL HIGH (ref 70–99)
Glucose-Capillary: 177 mg/dL — ABNORMAL HIGH (ref 70–99)
Glucose-Capillary: 177 mg/dL — ABNORMAL HIGH (ref 70–99)
Glucose-Capillary: 182 mg/dL — ABNORMAL HIGH (ref 70–99)
Glucose-Capillary: 186 mg/dL — ABNORMAL HIGH (ref 70–99)
Glucose-Capillary: 187 mg/dL — ABNORMAL HIGH (ref 70–99)
Glucose-Capillary: 189 mg/dL — ABNORMAL HIGH (ref 70–99)
Glucose-Capillary: 192 mg/dL — ABNORMAL HIGH (ref 70–99)
Glucose-Capillary: 197 mg/dL — ABNORMAL HIGH (ref 70–99)
Glucose-Capillary: 204 mg/dL — ABNORMAL HIGH (ref 70–99)
Glucose-Capillary: 213 mg/dL — ABNORMAL HIGH (ref 70–99)
Glucose-Capillary: 214 mg/dL — ABNORMAL HIGH (ref 70–99)
Glucose-Capillary: 239 mg/dL — ABNORMAL HIGH (ref 70–99)
Glucose-Capillary: 68 mg/dL — ABNORMAL LOW (ref 70–99)
Glucose-Capillary: 76 mg/dL (ref 70–99)
Glucose-Capillary: 82 mg/dL (ref 70–99)
Glucose-Capillary: 89 mg/dL (ref 70–99)
Glucose-Capillary: 89 mg/dL (ref 70–99)
Glucose-Capillary: 92 mg/dL (ref 70–99)
Glucose-Capillary: 93 mg/dL (ref 70–99)
Glucose-Capillary: 98 mg/dL (ref 70–99)
Glucose-Capillary: 99 mg/dL (ref 70–99)

## 2010-11-01 LAB — COMPREHENSIVE METABOLIC PANEL
ALT: 19 U/L (ref 0–35)
AST: 18 U/L (ref 0–37)
Albumin: 3.2 g/dL — ABNORMAL LOW (ref 3.5–5.2)
Alkaline Phosphatase: 83 U/L (ref 39–117)
BUN: 18 mg/dL (ref 6–23)
CO2: 25 mEq/L (ref 19–32)
Calcium: 8.9 mg/dL (ref 8.4–10.5)
Chloride: 108 mEq/L (ref 96–112)
Creatinine, Ser: 1.16 mg/dL (ref 0.4–1.2)
GFR calc Af Amer: 55 mL/min — ABNORMAL LOW (ref >60)
GFR calc non Af Amer: 45 mL/min — ABNORMAL LOW (ref >60)
Glucose, Bld: 139 mg/dL — ABNORMAL HIGH (ref 70–99)
Potassium: 3.9 mEq/L (ref 3.5–5.1)
Sodium: 138 mEq/L (ref 135–145)
Total Bilirubin: 0.8 mg/dL (ref 0.3–1.2)
Total Protein: 5.7 g/dL — ABNORMAL LOW (ref 6.0–8.3)

## 2010-11-01 LAB — BASIC METABOLIC PANEL
BUN: 14 mg/dL (ref 6–23)
CO2: 26 mEq/L (ref 19–32)
Calcium: 8.5 mg/dL (ref 8.4–10.5)
Chloride: 108 mEq/L (ref 96–112)
Creatinine, Ser: 1.22 mg/dL — ABNORMAL HIGH (ref 0.4–1.2)
GFR calc Af Amer: 51 mL/min — ABNORMAL LOW (ref >60)
GFR calc non Af Amer: 43 mL/min — ABNORMAL LOW (ref >60)
Glucose, Bld: 120 mg/dL — ABNORMAL HIGH (ref 70–99)
Potassium: 3.7 mEq/L (ref 3.5–5.1)
Sodium: 138 mEq/L (ref 135–145)

## 2010-11-01 LAB — DIFFERENTIAL
Basophils Absolute: 0 10*3/uL (ref 0.0–0.1)
Basophils Relative: 1 % (ref 0–1)
Eosinophils Absolute: 0.3 10*3/uL (ref 0.0–0.7)
Eosinophils Relative: 5 % (ref 0–5)
Lymphocytes Relative: 15 % (ref 12–46)
Lymphs Abs: 0.9 10*3/uL (ref 0.7–4.0)
Monocytes Absolute: 0.5 10*3/uL (ref 0.1–1.0)
Monocytes Relative: 9 % (ref 3–12)
Neutro Abs: 4.3 10*3/uL (ref 1.7–7.7)
Neutrophils Relative %: 71 % (ref 43–77)

## 2010-11-01 LAB — CBC
HCT: 34.3 % — ABNORMAL LOW (ref 36.0–46.0)
HCT: 35.9 % — ABNORMAL LOW (ref 36.0–46.0)
Hemoglobin: 11.2 g/dL — ABNORMAL LOW (ref 12.0–15.0)
Hemoglobin: 11.9 g/dL — ABNORMAL LOW (ref 12.0–15.0)
MCH: 26.8 pg (ref 26.0–34.0)
MCH: 26.9 pg (ref 26.0–34.0)
MCHC: 32.7 g/dL (ref 30.0–36.0)
MCHC: 33.1 g/dL (ref 30.0–36.0)
MCV: 81.2 fL (ref 78.0–100.0)
MCV: 82.1 fL (ref 78.0–100.0)
Platelets: 175 10*3/uL (ref 150–400)
Platelets: 195 10*3/uL (ref 150–400)
RBC: 4.18 MIL/uL (ref 3.87–5.11)
RBC: 4.42 MIL/uL (ref 3.87–5.11)
RDW: 13.9 % (ref 11.5–15.5)
RDW: 14.2 % (ref 11.5–15.5)
WBC: 5.3 10*3/uL (ref 4.0–10.5)
WBC: 6 10*3/uL (ref 4.0–10.5)

## 2010-11-02 LAB — COMPREHENSIVE METABOLIC PANEL
ALT: 24 U/L (ref 0–35)
AST: 23 U/L (ref 0–37)
Albumin: 3.7 g/dL (ref 3.5–5.2)
Alkaline Phosphatase: 90 U/L (ref 39–117)
BUN: 19 mg/dL (ref 6–23)
CO2: 25 mEq/L (ref 19–32)
Calcium: 9.1 mg/dL (ref 8.4–10.5)
Chloride: 108 mEq/L (ref 96–112)
Creatinine, Ser: 1.27 mg/dL — ABNORMAL HIGH (ref 0.4–1.2)
GFR calc Af Amer: 49 mL/min — ABNORMAL LOW (ref >60)
GFR calc non Af Amer: 41 mL/min — ABNORMAL LOW (ref >60)
Glucose, Bld: 178 mg/dL — ABNORMAL HIGH (ref 70–99)
Potassium: 4.4 mEq/L (ref 3.5–5.1)
Sodium: 140 mEq/L (ref 135–145)
Total Bilirubin: 0.7 mg/dL (ref 0.3–1.2)
Total Protein: 6 g/dL (ref 6.0–8.3)

## 2010-11-02 LAB — CBC
HCT: 37.9 % (ref 36.0–46.0)
Hemoglobin: 12.4 g/dL (ref 12.0–15.0)
MCH: 27.2 pg (ref 26.0–34.0)
MCHC: 32.7 g/dL (ref 30.0–36.0)
MCV: 83.1 fL (ref 78.0–100.0)
Platelets: 160 10*3/uL (ref 150–400)
RBC: 4.56 MIL/uL (ref 3.87–5.11)
RDW: 13.9 % (ref 11.5–15.5)
WBC: 6.6 10*3/uL (ref 4.0–10.5)

## 2010-11-02 LAB — GLUCOSE, CAPILLARY
Glucose-Capillary: 167 mg/dL — ABNORMAL HIGH (ref 70–99)
Glucose-Capillary: 168 mg/dL — ABNORMAL HIGH (ref 70–99)
Glucose-Capillary: 175 mg/dL — ABNORMAL HIGH (ref 70–99)

## 2010-11-02 LAB — CK TOTAL AND CKMB (NOT AT ARMC)
CK, MB: 1.9 ng/mL (ref 0.3–4.0)
Relative Index: INVALID (ref 0.0–2.5)
Total CK: 75 U/L (ref 7–177)

## 2010-11-02 LAB — DIFFERENTIAL
Basophils Absolute: 0 10*3/uL (ref 0.0–0.1)
Basophils Relative: 0 % (ref 0–1)
Eosinophils Absolute: 0.1 10*3/uL (ref 0.0–0.7)
Eosinophils Relative: 1 % (ref 0–5)
Lymphocytes Relative: 13 % (ref 12–46)
Lymphs Abs: 0.8 10*3/uL (ref 0.7–4.0)
Monocytes Absolute: 0.3 10*3/uL (ref 0.1–1.0)
Monocytes Relative: 5 % (ref 3–12)
Neutro Abs: 5.3 10*3/uL (ref 1.7–7.7)
Neutrophils Relative %: 81 % — ABNORMAL HIGH (ref 43–77)

## 2010-11-02 LAB — PROTIME-INR
INR: 1.04 (ref 0.00–1.49)
Prothrombin Time: 13.8 seconds (ref 11.6–15.2)

## 2010-11-02 LAB — HEMOGLOBIN A1C
Hgb A1c MFr Bld: 6.4 % — ABNORMAL HIGH (ref <5.7)
Mean Plasma Glucose: 137 mg/dL — ABNORMAL HIGH (ref <117)

## 2010-11-02 LAB — APTT: aPTT: 32 seconds (ref 24–37)

## 2010-11-02 LAB — TROPONIN I: Troponin I: 0.01 ng/mL (ref 0.00–0.06)

## 2010-11-07 NOTE — Assessment & Plan Note (Signed)
Summary: DIZZY/CLE  MEDICARE/CENTRAL RESERVE   Vital Signs:  Patient profile:   77 year old female Height:      63 inches Temp:     97.9 degrees F oral Pulse rate:   64 / minute Pulse rhythm:   regular BP sitting:   124 / 68  (left arm) Cuff size:   large  Vitals Entered By: Ozzie Hoyle LPN (March 13, [id number] AM) CC: dizziness since 10/29/10. H/a also on 10/29/10.   History of Present Illness: here for dizziness and headache   got up early 3/11 -- and dizzy going to bathroom went back to bed and woke up the same  felt like the room was spinning   headache started that afternoon  was frontal  mild and not throbbing  no history of headaches in past  headache was brief- just for a few hours   never had vertigo before   has had some sinus congestion -- no colored discharge  no fever or facial pain  took benadryl on sunday  later chlorcedin hbp later took otc meclizine 25 mg --tis helped some   now her dizziness is not as bad as it was but still dizzy  stopped glucotrol due to low sugar that is doing well    did not have dizziness or headache with stroke   Allergies: 1)  ! Voltaren 2)  ! * Actos  Past History:  Past Medical History: Last updated: 10/03/2010 Diabetes mellitus, type II Hypertension Osteoarthritis Hyperlipidemia angioedema was poss from voltaren vitamin b12 deficiency 4/08 LVH and atrial enlargement by echo in past with nl EF degenerative disk dz osteopenia  pruritis  renal insufficiency CVA small vessel- sobcortical (in [facility] trial ) with Dr Leonie Man  ortho -- Dr Tonita Cong GI - Dr Amedeo Plenty  psych- claudia Joellen [place] and Truett Perna   Past Surgical History: Last updated: 06/11/2010 Cataract extraction Hysterectomy, BSO- fibroids LVH-  EF 60% Echo- bi-atrial enlargement, mild (05/1995) Colonoscopy- no polyps (1995),   divertic., ? polyp (06/1998)  , diverticulosis (08/2003) L4- L5 disk disease- epidural inj. 12/1998 Dexa- osteopenia (04/2002)  , stable (10/20050 Knee surgery- arthroscope spinal decomression sx 8/09 (12/09) colonoscopy polyp/ adenomatous - re check 5 y  (flex sig done 4/10 was nl -- so rec 5 y f/u) 4/10 dexa stable osteopenia  10/11 admit CVA- small vessel  Family History: Last updated: 02/17/2010 sister CAD, brain tumor with hemorrhage  brother died of copd   Social History: Last updated: 05/23/2010 husband is ill- she cares for him  (he has severe anx and depression in and out of nsg homes) non smoker  no alcohol  works a lot outside in garden  Risk Factors: Smoking Status: never (11/27/2006)  Review of Systems General:  Complains of fatigue; denies chills, fever, loss of appetite, and malaise. Eyes:  Denies blurring, eye irritation, and eye pain. ENT:  Complains of postnasal drainage; denies ear discharge, earache, ringing in ears, sinus pressure, and sore throat. CV:  Denies chest pain or discomfort, palpitations, shortness of breath with exertion, and swelling of feet. Resp:  Denies cough, shortness of breath, and wheezing. GI:  Complains of nausea; denies abdominal pain, change in bowel habits, and vomiting. GU:  Denies dysuria and urinary frequency. MS:  Denies muscle weakness. Derm:  Denies poor wound healing and rash. Neuro:  Complains of disturbances in coordination, headaches, and sensation of room spinning; denies brief paralysis, difficulty with concentration, falling down, inability to speak, memory loss, tingling, tremors, visual disturbances,  and weakness. Psych:  mood continues to improve . Endo:  Denies cold intolerance, excessive thirst, excessive urination, and heat intolerance. Heme:  Denies abnormal bruising and bleeding.  Physical Exam  General:  overweight but generally well appearing  pt is moving slowly due to dizziness  Head:  normocephalic, atraumatic, and no abnormalities observed.  no sinus tenderness Eyes:  vision grossly intact, pupils equal, pupils round, and pupils  reactive to light.  no conjunctival pallor, injection or icterus  no fundi changes  few beats of horiz nystagmus bilaterally Ears:  R ear normal and L ear normal.   Nose:  nares are boggy but clear  Mouth:  pharynx pink and moist, no erythema, and no exudates.   Neck:  supple with full rom and no masses or thyromegally, no JVD or carotid bruit  Chest Wall:  No deformities, masses, or tenderness noted. Lungs:  Normal respiratory effort, chest expands symmetrically. Lungs are clear to auscultation, no crackles or wheezes. Heart:  Normal rate and regular rhythm. S1 and S2 normal without gallop, murmur, click, rub or other extra sounds. Abdomen:  Bowel sounds positive,abdomen soft and non-tender without masses, organomegaly or hernias noted. no renal bruits  Msk:  No deformity or scoliosis noted of thoracic or lumbar spine.  no acute joint changes  Extremities:  No clubbing, cyanosis, edema, or deformity noted with normal full range of motion of all joints.   Neurologic:  improved speech and mobility today  baseline L sided weakness- slowly improved  basline facial droop continues to improve no tremor alert & oriented X3, cranial nerves II-XII intact, and finger-to-nose normal.   Skin:  Intact without suspicious lesions or rashes Cervical Nodes:  No lymphadenopathy noted Psych:  seems fatigued    Impression & Recommendations:  Problem # 1:  HEADACHE (ICD-784.0) Assessment New this is improved but unusual for pt  may be sinus related but in light of recent stroke will check CT scan (no contrast due to renal insuff) Her updated medication list for this problem includes:    Metoprolol Succinate 25 Mg Xr24h-tab (Metoprolol succinate) .Marland Kitchen... 1/2 tablet twice daily    Aspirin 325 Mg Tabs (Aspirin) ..... One tablet daily    Advil 200 Mg Tabs (Ibuprofen) ..... Otc as directed.  Orders: Radiology Referral (Radiology)  Problem # 2:  DIZZINESS (ICD-780.4) Assessment: New since 3/11 - some  features of vertigo  in light of recent stroke and headache I feel it necessary to check CTof head as well given px for meclizine for as needed use  disc turning and changing position slowly to avoid falls pt cancelled PT for today and tomorrow Her updated medication list for this problem includes:    Benadryl 25 Mg Tabs (Diphenhydramine hcl) ..... Otc as directed.    Claritin 10 Mg Tabs (Loratadine) ..... Otc as directed.    Diphenhydramine Hcl 25 Mg Caps (Diphenhydramine hcl) ..... Otc as directed.    Meclizine Hcl 25 Mg Tabs (Meclizine hcl) .Marland Kitchen... 1 by mouth up to three times a day as needed dizziness watch out for sedation  Orders: Radiology Referral (Radiology)  Problem # 3:  CVA WITH LEFT HEMIPARESIS (ICD-438.20) Assessment: Comment Only see above -with new dizzy and ha- check CT continues asa  Her updated medication list for this problem includes:    Aspirin 325 Mg Tabs (Aspirin) ..... One tablet daily  Orders: Radiology Referral (Radiology)  Problem # 4:  B12 DEFICIENCY (ICD-266.2) Assessment: Unchanged shot today Orders: Vit B12 1000 mcg ([id number])  Admin of Therapeutic Inj  intramuscular or subcutaneous [id number])  Complete Medication List: 1)  Lopid 600 Mg Tabs (Gemfibrozil) .... Take one by mouth daily 2)  Mavik 4 Mg Tabs (Trandolapril) .Marland Kitchen.. 1 by mouth once daily 3)  Vitamin B-12 Cr 1000 Mcg Tbcr (Cyanocobalamin) .... One injection every 8 weeks 4)  Vitamin D 1000 Iu  .... One daily 5)  Centrum Tabs (Multiple vitamins-minerals) .... Take 1 tablet by mouth once a day 6)  Amlodipine Besylate 10 Mg Tabs (Amlodipine besylate) .... Take 1 tablet by mouth once a day 7)  Lantus Solostar 100 Unit/ml Soln (Insulin glargine) .... Inject 10 units once a day or as directed 8)  Pin Needles For Lantus Solostar  .... To inject once daily as directed 9)  Metoprolol Succinate 25 Mg Xr24h-tab (Metoprolol succinate) .... 1/2 tablet twice daily 10)  Oscal 500/200 D-3 500-200 Mg-unit Tabs  (Calcium carbonate-vitamin d) .... One tablet daily 11)  Aspirin 325 Mg Tabs (Aspirin) .... One tablet daily 12)  Oxybutynin Chloride 10 Mg Xr24h-tab (Oxybutynin chloride) .Marland Kitchen.. 1 by mouth once daily 13)  Hydralazine Hcl 50 Mg Tabs (Hydralazine hcl) .... One tablet by mouth three times a day. 14)  Advil 200 Mg Tabs (Ibuprofen) .... Otc as directed. 15)  Benadryl 25 Mg Tabs (Diphenhydramine hcl) .... Otc as directed. 16)  Claritin 10 Mg Tabs (Loratadine) .... Otc as directed. 17)  Remeron 15 Mg Tabs (Mirtazapine) .... 1/2 by mouth at bedtime 18)  Diphenhydramine Hcl 25 Mg Caps (Diphenhydramine hcl) .... Otc as directed. 19)  Coricidin D Cold/flu/sinus 2-5-325 Mg Tabs (Chlorphen-pe-acetaminophen) .... Otc as directed. 20)  Meclizine Hcl 25 Mg Tabs (Meclizine hcl) .Marland Kitchen.. 1 by mouth up to three times a day as needed dizziness watch out for sedation  Patient Instructions: 1)  we will set you up for a cat scan at check out  2)  take the meclizine up to every 8 hours as needed dizziness (watch out that will make you sleepy) 3)  keep up a good fluid intake  4)  update me if symptoms worsen in the meantime  5)  I will update you with test results Prescriptions: MECLIZINE HCL 25 MG TABS (MECLIZINE HCL) 1 by mouth up to three times a day as needed dizziness watch out for sedation  #30 x 1   Entered and Authorized by:   [name] MD   Signed by:   [name] MD on 10/31/2010   Method used:   Electronically to        [facility] (retail)       [address]       [place], [place]  [address]       Ph: [phone]       Fax: [phone]   RxID:   [id number]    Medication Administration  Injection # 1:    Medication: Vit B12 1000 mcg    Diagnosis: B12 DEFICIENCY (ICD-266.2)    Route: IM    Site: L deltoid    Exp Date: 05/20/2012    Lot #: [id number]    Mfr: [employer]    Patient tolerated injection without complications    Given by: Ozzie Hoyle LPN (March 13, X33443 [id number]  AM)  Orders Added: 1)  Vit B12 1000 mcg [J3420] 2)  Admin of Therapeutic Inj  intramuscular or subcutaneous [[id number]] 3)  Radiology Referral [Radiology] 4)  Est. Patient Level IV [id number]    Current Allergies (reviewed today): ! VOLTAREN ! *  ACTOS

## 2010-11-17 ENCOUNTER — Ambulatory Visit: Payer: Medicare Other | Admitting: Physical Medicine & Rehabilitation

## 2010-11-17 ENCOUNTER — Encounter: Payer: Medicare Other | Attending: Physical Medicine & Rehabilitation

## 2010-11-17 DIAGNOSIS — M79609 Pain in unspecified limb: Secondary | ICD-10-CM | POA: Insufficient documentation

## 2010-11-17 DIAGNOSIS — I69959 Hemiplegia and hemiparesis following unspecified cerebrovascular disease affecting unspecified side: Secondary | ICD-10-CM | POA: Insufficient documentation

## 2010-11-17 DIAGNOSIS — M19049 Primary osteoarthritis, unspecified hand: Secondary | ICD-10-CM

## 2010-11-17 DIAGNOSIS — G8110 Spastic hemiplegia affecting unspecified side: Secondary | ICD-10-CM

## 2010-11-17 DIAGNOSIS — G5610 Other lesions of median nerve, unspecified upper limb: Secondary | ICD-10-CM

## 2010-11-28 ENCOUNTER — Encounter: Payer: Self-pay | Admitting: Family Medicine

## 2010-11-28 DIAGNOSIS — G4730 Sleep apnea, unspecified: Secondary | ICD-10-CM | POA: Insufficient documentation

## 2010-12-13 ENCOUNTER — Encounter: Payer: Self-pay | Admitting: Family Medicine

## 2010-12-22 ENCOUNTER — Other Ambulatory Visit: Payer: Self-pay | Admitting: Family Medicine

## 2010-12-22 DIAGNOSIS — E7800 Pure hypercholesterolemia, unspecified: Secondary | ICD-10-CM

## 2010-12-23 ENCOUNTER — Encounter: Payer: Self-pay | Admitting: Family Medicine

## 2010-12-27 ENCOUNTER — Other Ambulatory Visit (INDEPENDENT_AMBULATORY_CARE_PROVIDER_SITE_OTHER): Payer: Medicare Other | Admitting: Family Medicine

## 2010-12-27 DIAGNOSIS — E7800 Pure hypercholesterolemia, unspecified: Secondary | ICD-10-CM

## 2010-12-27 DIAGNOSIS — E119 Type 2 diabetes mellitus without complications: Secondary | ICD-10-CM

## 2010-12-27 LAB — AST: AST: 20 U/L (ref 0–37)

## 2010-12-27 LAB — RENAL FUNCTION PANEL
Albumin: 3.7 g/dL (ref 3.5–5.2)
BUN: 26 mg/dL — ABNORMAL HIGH (ref 6–23)
CO2: 27 mEq/L (ref 19–32)
Calcium: 9.1 mg/dL (ref 8.4–10.5)
Chloride: 106 mEq/L (ref 96–112)
Creatinine, Ser: 1.4 mg/dL — ABNORMAL HIGH (ref 0.4–1.2)
GFR: 37.55 mL/min — ABNORMAL LOW (ref >60.00)
Glucose, Bld: 94 mg/dL (ref 70–99)
Phosphorus: 3.3 mg/dL (ref 2.3–4.6)
Potassium: 4.2 mEq/L (ref 3.5–5.1)
Sodium: 141 mEq/L (ref 135–145)

## 2010-12-27 LAB — LIPID PANEL
Cholesterol: 188 mg/dL (ref 0–200)
HDL: 31.9 mg/dL — ABNORMAL LOW (ref >39.00)
LDL Cholesterol: 131 mg/dL — ABNORMAL HIGH (ref 0–99)
Total CHOL/HDL Ratio: 6
Triglycerides: 128 mg/dL (ref 0.0–149.0)
VLDL: 25.6 mg/dL (ref 0.0–40.0)

## 2010-12-27 LAB — ALT: ALT: 15 U/L (ref 0–35)

## 2010-12-27 LAB — HEMOGLOBIN A1C: Hgb A1c MFr Bld: 6 % (ref 4.6–6.5)

## 2011-01-01 ENCOUNTER — Encounter: Payer: Self-pay | Admitting: Family Medicine

## 2011-01-01 ENCOUNTER — Ambulatory Visit (INDEPENDENT_AMBULATORY_CARE_PROVIDER_SITE_OTHER): Payer: Medicare Other | Admitting: Family Medicine

## 2011-01-01 DIAGNOSIS — F3289 Other specified depressive episodes: Secondary | ICD-10-CM

## 2011-01-01 DIAGNOSIS — E119 Type 2 diabetes mellitus without complications: Secondary | ICD-10-CM

## 2011-01-01 DIAGNOSIS — E785 Hyperlipidemia, unspecified: Secondary | ICD-10-CM

## 2011-01-01 DIAGNOSIS — F329 Major depressive disorder, single episode, unspecified: Secondary | ICD-10-CM

## 2011-01-01 DIAGNOSIS — I10 Essential (primary) hypertension: Secondary | ICD-10-CM

## 2011-01-01 DIAGNOSIS — I69959 Hemiplegia and hemiparesis following unspecified cerebrovascular disease affecting unspecified side: Secondary | ICD-10-CM

## 2011-01-01 DIAGNOSIS — N259 Disorder resulting from impaired renal tubular function, unspecified: Secondary | ICD-10-CM

## 2011-01-01 MED ORDER — HYDRALAZINE HCL 50 MG PO TABS: 50.0000 mg | ORAL_TABLET | Freq: Three times a day (TID) | ORAL | Status: DC

## 2011-01-01 NOTE — Assessment & Plan Note (Signed)
This is worse- presumably from eating high sat fat food See inst- adv to change diet  Re check 3 mo and f/u  May need statin if not imp  Trig are controlled with current med Disc goals for LDL - under 100 in light ofDM and cva

## 2011-01-01 NOTE — Patient Instructions (Signed)
Stop the zoloft/ sertaline - if you want to try and you don't think you need it  Avoid red meat/ fried foods/ egg yolks/ fatty breakfast meats/ butter, cheese and high fat dairy/ and shellfish   (eat egg whites or egg beaters -- not the yolks) , switch to [place] bacon or sausage , and avoid ice cream  Cholesterol is up - but all else is stable  Schedule fasting lab and then follow up in 3 months

## 2011-01-01 NOTE — Assessment & Plan Note (Signed)
Stable with regular renal f/u Drinking fluids  Avoiding renal meds Keeping bp under control

## 2011-01-01 NOTE — Assessment & Plan Note (Signed)
Very well controlled on lantus at this time / low dose Watching diet Rev labs Lab and f/u 3 mo

## 2011-01-01 NOTE — Assessment & Plan Note (Signed)
Good control Rev all meds in detail Refilled hydralazine expl imp of good control for kidney fxn Rev labs F/u 3 mo

## 2011-01-01 NOTE — Progress Notes (Signed)
Subjective:    Patient ID: Renee [name], female    DOB: Apr 04, 1929, 77 y.o.   MRN: [id number]  HPI  Here for f/u of lipids and DM and HTN and renal insuff and depression  Nothing new going on  Feels good   Lipids are up a bit -- lopid is controlling the triglycerides  LDL 131- this is up  Appetite is fair - not as bad as it was  No fried food and no red meat / does eat whole eggs/ and eats bacon and sausage and biscuits , no cheeze , likes ice cream   DM good overall - a1c is 6.0 Was having hypoglycemia- no longer has low sugars Usually under 100 in the ams  On lantus now   HTN in good control 124/68 Renal doc did inc her hydralazine  Renal insuff is stable - this cr 1.4 (was 1.5) Avoiding renally toxic meds    Doing well with depression  Is still on sertaline -- we were not aware of that  Is on the remeron and that helped   Had sleep study for apnea - is waiting on result   Past Medical History  Diagnosis Date  . Sleep apnea   . Diabetes mellitus     type II  . Hypertension   . Hyperlipidemia   . Stroke     Small vessel sobcortical (in [place] trial) with Dr Leonie Man  . Osteoarthritis   . Angioedema     possibly from voltaren  . Vitamin B 12 deficiency 04/08  . LVH (left ventricular hypertrophy)     and atrial enlargement by echo in past with nl EF  . Degenerative disc disease   . Osteopenia   . Nasal pruritis   . Renal insufficiency     History   Social History  . Marital Status: Married    Spouse Name: N/A    Number of Children: N/A  . Years of Education: N/A   Occupational History  . Not on file.   Social History Main Topics  . Smoking status: Never Smoker   . Smokeless tobacco: Not on file  . Alcohol Use: No  . Drug Use:   . Sexually Active:    Other Topics Concern  . Not on file   Social History Narrative  . No narrative on file    Family History  Problem Relation Age of Onset  . Cancer Sister     brain tumor with hemmorhage  . Heart  disease Sister     CAD  . COPD Brother     Review of Systems Review of Systems  Constitutional: Negative for fever, appetite change, and unexpected weight change. pos fatigue  Eyes: Negative for pain and visual disturbance.  Respiratory: Negative for cough and shortness of breath.   Cardiovascular: Negative.   Gastrointestinal: Negative for nausea, diarrhea and constipation.  Genitourinary: Negative for urgency and frequency.  Skin: Negative for pallor.  Neurological:, light-headedness, numbness and headaches. pos for weakness on one side  Hematological: Negative for adenopathy. Does not bruise/bleed easily.  Psychiatric/Behavioral: Negative for dysphoric mood. The patient is not nervous/anxious.          Objective:   Physical Exam  Constitutional: She appears well-developed and well-nourished. No distress.       overwt and well appearing   HENT:  Head: Normocephalic and atraumatic.  Mouth/Throat: Oropharynx is clear and moist.  Eyes: Conjunctivae and EOM are normal. Pupils are equal, round, and reactive to light.  Neck: Normal range of motion. Neck supple. No JVD present. Carotid bruit is not present. No thyromegaly present.  Cardiovascular: Normal rate, regular rhythm and normal heart sounds.   Pulmonary/Chest: Effort normal and breath sounds normal. No respiratory distress. She has no wheezes.  Abdominal: Soft. Bowel sounds are normal. She exhibits no distension and no mass. There is no tenderness.  Musculoskeletal: She exhibits no edema and no tenderness.  Lymphadenopathy:    She has no cervical adenopathy.  Neurological: She has normal reflexes. A cranial nerve deficit is present. Coordination normal.       No change in hemiplegia   Speech continues to improve   Skin: Skin is warm and dry. No rash noted. No erythema. No pallor.  Psychiatric: She has a normal mood and affect.          Assessment & Plan:

## 2011-01-01 NOTE — Assessment & Plan Note (Signed)
Pt was unknowingly still on sertaline and wants to stop it  Imp with remeron Will wean off sertaline and let me know if any problems

## 2011-01-01 NOTE — Assessment & Plan Note (Signed)
Continues to slowly improve  Pending result of sleep study to see if apnea played a role

## 2011-01-02 NOTE — Op Note (Signed)
Renee [name], Renee [name]                ACCOUNT NO.:  [id number]   MEDICAL RECORD NO.:  [id number]          PATIENT TYPE:  AMB   LOCATION:  DAY                          FACILITY:  [facility]   PHYSICIAN:  Susa Day, M.D.    DATE OF BIRTH:  [birth year]   DATE OF PROCEDURE:  04/01/2008  DATE OF DISCHARGE:                               OPERATIVE REPORT   PREOPERATIVE DIAGNOSIS:  Spinal stenosis at L4-5.   POSTOPERATIVE DIAGNOSIS:  Spinal stenosis at L4-5, L3-4.   PROCEDURE PERFORMED:  Lumbar decompression L4-5, L3-4, central  laminectomy at L4, bilateral hemilaminotomies of L5 and L4,  foraminotomies of L4.   ANESTHESIA:  General.   ASSISTANT:  Rometta Emery, P.A.   BRIEF HISTORY AND INDICATIONS:  This 77 year old with neurogenic  claudication secondary to spinal stenosis mainly laterally, right  greater than left.  MRI indicating significant stenosis in the lateral  recess, multifactorial.  She was indicated for decompression.  Risks and  benefits were discussed, including bleeding, infection, damage to  surrounding structures, CSF leakage, epidural fibrosis of adjacent  segments, the need for fusion in future, anesthetic complications, etc.   TECHNIQUE:  The patient was placed in the supine position.  After  induction of adequate anesthesia and 1 g Kefzol, she was placed prone on  the Wilson frame.  All bony prominences were well padded.  Lumbar region  was prepped in the usual sterile fashion.  Two 18-gauge spinal needles  were utilized to localize the L4-5 interspace, confirmed with x-ray.  Incision was made from the spinous process, above the spinous process of  L4 to below L5.  Subcutaneous tissue was dissected.  Electrocautery was  utilized to achieve hemostasis.  Dorsolumbar fascia was identified and  divided in line with the skin incision.  Paraspinous muscles were  elevated from the lamina of L4 and L5.  McCullough retractor was placed.  The spinous processes of L4 and L5  were removed with a Leksell rongeur.  Operating microscope was draped and brought into the surgical field.  A  central decompression was performed, starting at the caudad edge of L4  with a 2 mm Kerrison extending cephalad, first centrally, performing a  laminectomy to the significant stenosis noted.  Neural patties were  placed beneath the elements to protect the thecal sac.  The full lamina  of four was removed.  Ligamentum flavum hypertrophy was noted in the  lateral recesses bilaterally.  It extended into L3-4.  We decompressed  the lateral recesses into L3-4 and removed the ligamentum from L3-4 as  well, detaching it from its caudad edge of L3.  This fully decompressed  the zone of stenosis.  I also decompressed it distally, performed  hemilaminotomies bilaterally at L5, removed the ligamentum flavum.  Foraminotomies of L4 were performed, with no evidence of disk  herniation.  Bipolar electrocautery was utilized to achieve hemostasis.  Hockey-stick probe was then passed freely in the foramina of L3, L4, and  L5 bilaterally.  No evidence CSF leakage or active bleeding.  Bone wax  was used on the cancellous surfaces, and the wound  was copiously  irrigated with antibiotic irrigation.  McCullough retractors were  removed.  Paraspinous muscles were inspected, with no evidence of active  bleeding.  Dorsolumbar fascia was reapproximated with #1 Vicryl  interrupted figure-of-eight sutures.  Subcutaneous tissue was  reapproximated with 2-0 simple sutures.  Skin was reapproximate with  staples.  Wound was dressed sterilely.  It went without difficulty.  Transported to the recovery room in satisfactory addition.   The patient tolerated the procedure well.  No complications.   BLOOD LOSS:  50 mL.      Susa Day, M.D.  Electronically Signed     JB/MEDQ  D:  04/01/2008  T:  04/01/2008  Job:  [id number]

## 2011-01-05 NOTE — Op Note (Signed)
NAME:  Renee [name], Renee [name]                          ACCOUNT NO.:  [id number]   MEDICAL RECORD NO.:  [id number]                   PATIENT TYPE:  AMB   LOCATION:  ENDO                                 FACILITY:  [facility]   PHYSICIAN:  John C. Amedeo Plenty, M.D.                 DATE OF BIRTH:  Jun 05, 1929   DATE OF PROCEDURE:  09/06/2003  DATE OF DISCHARGE:                                 OPERATIVE REPORT   PROCEDURE:  Colonoscopy.   INDICATION FOR PROCEDURE:  A history of adenomatous colon polyps on initial  colonoscopy five years ago.  Incidentally, __________ perforation of the  colon requiring surgery at that time.   DESCRIPTION OF PROCEDURE:  The patient was placed in the left lateral  decubitus position and placed on the pulse monitor with continuous low-flow  oxygen delivered by nasal cannula.  She was sedated with 62.5 mcg IV  fentanyl and 6 mg IV Versed.  The Olympus video colonoscope was inserted  into the rectum and advanced to the cecum, confirmed by transillumination of  McBurney's point and visualization of the ileocecal valve and appendiceal  orifice.  The prep was excellent.  The cecum, ascending, transverse, and  descending colon all appeared normal with no masses, polyps, diverticula, or  other mucosal abnormalities.  Within the sigmoid colon there were seen  several scattered diverticula and no other abnormality.  The rectum appeared  normal, and retroflexed view of the anus revealed no obvious internal  hemorrhoids.  The scope was then withdrawn and the patient returned to the  recovery room in stable condition.  She tolerated the procedure well, and  there were no immediate complications.   IMPRESSION:  Diverticulosis, otherwise normal study.   PLAN:  Will repeat colonoscopy in five years.                                               John C. Amedeo Plenty, M.D.    JCH/MEDQ  D:  09/06/2003  T:  09/06/2003  Job:  [id number]   cc:   Marne A. Glori Bickers, M.D. [facility]

## 2011-01-10 ENCOUNTER — Ambulatory Visit (INDEPENDENT_AMBULATORY_CARE_PROVIDER_SITE_OTHER): Payer: Medicare Other | Admitting: Family Medicine

## 2011-01-10 ENCOUNTER — Encounter: Payer: Self-pay | Admitting: Family Medicine

## 2011-01-10 VITALS — BP 116/78 | HR 68 | Temp 97.8°F | Wt 179.0 lb

## 2011-01-10 DIAGNOSIS — E119 Type 2 diabetes mellitus without complications: Secondary | ICD-10-CM

## 2011-01-10 DIAGNOSIS — H3320 Serous retinal detachment, unspecified eye: Secondary | ICD-10-CM

## 2011-01-10 DIAGNOSIS — Z01818 Encounter for other preprocedural examination: Secondary | ICD-10-CM

## 2011-01-10 DIAGNOSIS — F3289 Other specified depressive episodes: Secondary | ICD-10-CM

## 2011-01-10 DIAGNOSIS — F329 Major depressive disorder, single episode, unspecified: Secondary | ICD-10-CM

## 2011-01-10 DIAGNOSIS — Z0000 Encounter for general adult medical examination without abnormal findings: Secondary | ICD-10-CM

## 2011-01-10 DIAGNOSIS — I69959 Hemiplegia and hemiparesis following unspecified cerebrovascular disease affecting unspecified side: Secondary | ICD-10-CM

## 2011-01-10 DIAGNOSIS — Z8669 Personal history of other diseases of the nervous system and sense organs: Secondary | ICD-10-CM | POA: Insufficient documentation

## 2011-01-10 MED ORDER — SERTRALINE HCL 50 MG PO TABS: 50.0000 mg | ORAL_TABLET | Freq: Every day | ORAL | Status: DC

## 2011-01-10 MED ORDER — MIRTAZAPINE 15 MG PO TABS: ORAL_TABLET | ORAL | Status: DC

## 2011-01-10 NOTE — Progress Notes (Signed)
Subjective:    Patient ID: Renee [name], female    DOB: Jul 14, 1929, 77 y.o.   MRN: [id number]  HPI Here for surgical clearance  Is getting ready for surgery for detatched retina in eye  Having decreased vision over eye for 2 months  May have happened with last fall   Surgery is planned tomorrow  Needed to stop aspirin -- not had one since the 16th   Needs to go back on her zoloft -- was nervous and upset  Dr Zigmund Daniel is doing her surgery   Has had multiple surgeries in the past - no problems with anesthesia  No n/v  No allergies to numbing medicines or foods Generally does fine with surgery  Had cva 8 mo ago with much improvement in hemiparesis  No recurrence off asa for several days  Past Medical History  Diagnosis Date  . Sleep apnea   . Diabetes mellitus     type II  . Hypertension   . Hyperlipidemia   . Stroke     Small vessel sobcortical (in [place] trial) with Dr Leonie Man  . Osteoarthritis   . Angioedema     possibly from voltaren  . Vitamin B 12 deficiency 04/08  . LVH (left ventricular hypertrophy)     and atrial enlargement by echo in past with nl EF  . Degenerative disc disease   . Osteopenia   . Nasal pruritis   . Renal insufficiency     History   Social History  . Marital Status: Married    Spouse Name: N/A    Number of Children: N/A  . Years of Education: N/A   Occupational History  . Not on file.   Social History Main Topics  . Smoking status: Never Smoker   . Smokeless tobacco: Not on file  . Alcohol Use: No  . Drug Use:   . Sexually Active:    Other Topics Concern  . Not on file   Social History Narrative  . No narrative on file    Past Surgical History  Procedure Date  . Eye surgery     cataract extraction  . Abdominal hysterectomy     BSO-fibroids  . Knee surgery     arthroscope  . Spine surgery 08/09    spinal decompression surgery    Family History  Problem Relation Age of Onset  . Cancer Sister     brain tumor with  hemmorhage  . Heart disease Sister     CAD  . COPD Brother     Allergies  Allergen Reactions  . Diclofenac Sodium     REACTION: angioedema  . Pioglitazone     REACTION: swelling   Current outpatient prescriptions:amLODipine (NORVASC) 10 MG tablet, Take 10 mg by mouth daily.  , Disp: , Rfl: ;  aspirin 325 MG tablet, Take 325 mg by mouth daily.  , Disp: , Rfl: ;  calcium-vitamin D (OSCAL WITH D) 500-200 MG-UNIT per tablet, Take 1 tablet by mouth daily.  , Disp: , Rfl: ;  Cholecalciferol (VITAMIN D) 1000 UNITS capsule, Take 1,000 Units by mouth daily.  , Disp: , Rfl:  cyanocobalamin (,VITAMIN B-12,) 1000 MCG/ML injection, Inject 1,000 mcg into the muscle every 8 (eight) weeks.  , Disp: , Rfl: ;  Fexofenadine HCl (ALLEGRA PO), Take by mouth. OTC as directed , Disp: , Rfl: ;  gemfibrozil (LOPID) 600 MG tablet, Take 600 mg by mouth daily.  , Disp: , Rfl: ;  hydrALAZINE (APRESOLINE) 50 MG tablet,  Take 1 tablet (50 mg total) by mouth 3 (three) times daily., Disp: 270 tablet, Rfl: 3 insulin glargine (LANTUS) 100 UNIT/ML injection, Inject 10 Units into the skin daily. Or as directed. , Disp: , Rfl: ;  metoprolol succinate (TOPROL-XL) 25 MG 24 hr tablet, 1/2 tablet by mouth twice a day , Disp: , Rfl: ;  mirtazapine (REMERON) 15 MG tablet, 1/2 pill by mouth each bedtime , Disp: 45 tablet, Rfl: 3;  Multiple Vitamin (MULTIVITAMIN) capsule, Take 1 capsule by mouth daily.  , Disp: , Rfl:  oxybutynin (DITROPAN-XL) 10 MG 24 hr tablet, Take 10 mg by mouth daily.  , Disp: , Rfl: ;  trandolapril (MAVIK) 4 MG tablet, Take 4 mg by mouth daily.  , Disp: , Rfl: ;  DISCONTD: mirtazapine (REMERON) 15 MG tablet, Take 7.5 mg by mouth at bedtime.  , Disp: , Rfl:         Review of Systems Review of Systems  Constitutional: Negative for fever, appetite change, fatigue and unexpected weight change.  Eyes: Negative for pain and pos for visual change   Respiratory: Negative for cough and shortness of breath.     Cardiovascular: Negative.for cp or sob or edema    Gastrointestinal: Negative for nausea, diarrhea and constipation.  Genitourinary: Negative for urgency and frequency.  Skin: Negative for pallor.  Neurological: Negative for, light-headedness, numbness and headaches. --pos for baseline one sided weakness  Hematological: Negative for adenopathy. Does not bruise/bleed easily.  Psychiatric/Behavioral: Negative for dysphoric mood. Pos for mild anxiety off zoloft         Objective:   Physical Exam  Constitutional: She appears well-developed and well-nourished. No distress.  HENT:  Head: Normocephalic and atraumatic.  Nose: Nose normal.  Mouth/Throat: Oropharynx is clear and moist.  Eyes: Conjunctivae and EOM are normal. Pupils are equal, round, and reactive to light. Right eye exhibits no discharge. Left eye exhibits no discharge.  Neck: Normal range of motion. Neck supple. No JVD present. No thyromegaly present.  Cardiovascular: Normal rate, regular rhythm and normal heart sounds.  Exam reveals no gallop.   Pulmonary/Chest: Effort normal and breath sounds normal. No respiratory distress. She has no wheezes.  Abdominal: Soft. Bowel sounds are normal. She exhibits no distension, no abdominal bruit and no mass. There is no tenderness.  Musculoskeletal: She exhibits edema. She exhibits no tenderness.       Trace pedal edema  Poor rom LS baseline  Lymphadenopathy:    She has no cervical adenopathy.  Neurological: She is alert. She has normal reflexes.       Baseline hemiparesis with continued improvement Able to get up on table without difficulty  Skin: Skin is warm and dry. No rash noted. No erythema. No pallor.  Psychiatric: She has a normal mood and affect.       Good eye contact and comm skills Is a bit anxious about her surgery tomorrow          Assessment & Plan:

## 2011-01-10 NOTE — Assessment & Plan Note (Signed)
Very well controlled with small dose of lantus  Will hold this am of eye surgery

## 2011-01-10 NOTE — Assessment & Plan Note (Signed)
Ok/ no recurrence off asa for several days - but needs to go back on this asap after her eye surgery- as soon as safe

## 2011-01-10 NOTE — Assessment & Plan Note (Signed)
Will add back zoloft for more anx / recent stress Continue remeron Close f/u

## 2011-01-10 NOTE — Patient Instructions (Signed)
No restrictions for surgery  Re start your aspirin as soon as Dr Zigmund Daniel says it is ok  Skip your insulin tomorrow am  I will fax your note to [facility]  Get back on zoloft  I will send your mirtazapine px to the pharmacy

## 2011-01-10 NOTE — Assessment & Plan Note (Signed)
New For surgery tomorrow See preop exam tab

## 2011-01-10 NOTE — Assessment & Plan Note (Signed)
With no restrictions for upcoming retinal surgery  Off asa with no problem for now - needs to return to it as soon as safe to do so  No recurrent cva sympt and HTn in good control  EKG NSR with rate of 60 and RSR in lead one , no acute changes overall  Will forward note/ recent labs and EKG to Dr Zigmund Daniel

## 2011-01-11 ENCOUNTER — Observation Stay (HOSPITAL_COMMUNITY)
Admission: RE | Admit: 2011-01-11 | Discharge: 2011-01-12 | Disposition: A | Discharge status: Home / Self Care | Payer: Medicare Other | Source: Ambulatory Visit | Attending: Ophthalmology | Admitting: Ophthalmology

## 2011-01-11 ENCOUNTER — Ambulatory Visit (HOSPITAL_COMMUNITY): Payer: Medicare Other

## 2011-01-11 DIAGNOSIS — Z8673 Personal history of transient ischemic attack (TIA), and cerebral infarction without residual deficits: Secondary | ICD-10-CM | POA: Insufficient documentation

## 2011-01-11 DIAGNOSIS — Z794 Long term (current) use of insulin: Secondary | ICD-10-CM | POA: Insufficient documentation

## 2011-01-11 DIAGNOSIS — Z01818 Encounter for other preprocedural examination: Secondary | ICD-10-CM | POA: Insufficient documentation

## 2011-01-11 DIAGNOSIS — H33009 Unspecified retinal detachment with retinal break, unspecified eye: Principal | ICD-10-CM | POA: Insufficient documentation

## 2011-01-11 DIAGNOSIS — E119 Type 2 diabetes mellitus without complications: Secondary | ICD-10-CM | POA: Insufficient documentation

## 2011-01-11 DIAGNOSIS — I10 Essential (primary) hypertension: Secondary | ICD-10-CM | POA: Insufficient documentation

## 2011-01-11 DIAGNOSIS — Z7982 Long term (current) use of aspirin: Secondary | ICD-10-CM | POA: Insufficient documentation

## 2011-01-11 DIAGNOSIS — Z01812 Encounter for preprocedural laboratory examination: Secondary | ICD-10-CM | POA: Insufficient documentation

## 2011-01-11 DIAGNOSIS — Z79899 Other long term (current) drug therapy: Secondary | ICD-10-CM | POA: Insufficient documentation

## 2011-01-11 DIAGNOSIS — G4733 Obstructive sleep apnea (adult) (pediatric): Secondary | ICD-10-CM | POA: Insufficient documentation

## 2011-01-11 LAB — CBC
HCT: 34.3 % — ABNORMAL LOW (ref 36.0–46.0)
Hemoglobin: 11.3 g/dL — ABNORMAL LOW (ref 12.0–15.0)
MCH: 27 pg (ref 26.0–34.0)
MCHC: 32.9 g/dL (ref 30.0–36.0)
MCV: 82.1 fL (ref 78.0–100.0)
Platelets: 153 10*3/uL (ref 150–400)
RBC: 4.18 MIL/uL (ref 3.87–5.11)
RDW: 14.3 % (ref 11.5–15.5)
WBC: 4.6 10*3/uL (ref 4.0–10.5)

## 2011-01-11 LAB — BASIC METABOLIC PANEL
BUN: 20 mg/dL (ref 6–23)
CO2: 30 mEq/L (ref 19–32)
Calcium: 9.5 mg/dL (ref 8.4–10.5)
Chloride: 105 mEq/L (ref 96–112)
Creatinine, Ser: 1.4 mg/dL — ABNORMAL HIGH (ref 0.4–1.2)
GFR calc Af Amer: 44 mL/min — ABNORMAL LOW (ref >60)
GFR calc non Af Amer: 36 mL/min — ABNORMAL LOW (ref >60)
Glucose, Bld: 110 mg/dL — ABNORMAL HIGH (ref 70–99)
Potassium: 4.4 mEq/L (ref 3.5–5.1)
Sodium: 141 mEq/L (ref 135–145)

## 2011-01-11 LAB — GLUCOSE, CAPILLARY
Glucose-Capillary: 116 mg/dL — ABNORMAL HIGH (ref 70–99)
Glucose-Capillary: 71 mg/dL (ref 70–99)
Glucose-Capillary: 81 mg/dL (ref 70–99)
Glucose-Capillary: 136 mg/dL — ABNORMAL HIGH (ref 70–99)
Glucose-Capillary: 188 mg/dL — ABNORMAL HIGH (ref 70–99)

## 2011-01-11 LAB — SURGICAL PCR SCREEN
MRSA, PCR: NEGATIVE
Staphylococcus aureus: NEGATIVE

## 2011-01-12 LAB — GLUCOSE, CAPILLARY
Glucose-Capillary: 114 mg/dL — ABNORMAL HIGH (ref 70–99)
Glucose-Capillary: 143 mg/dL — ABNORMAL HIGH (ref 70–99)
Glucose-Capillary: 158 mg/dL — ABNORMAL HIGH (ref 70–99)

## 2011-01-18 NOTE — Op Note (Signed)
  [name], Renee [name]                ACCOUNT NO.:  [id number]  MEDICAL RECORD NO.:  [id number]           PATIENT TYPE:  O  LOCATION:  [id number]                         FACILITY:  [facility]  PHYSICIAN:  Chrystie Nose. Zigmund Daniel, M.D. DATE OF BIRTH:  [birth year]  DATE OF PROCEDURE:  01/11/2011 DATE OF DISCHARGE:                              OPERATIVE REPORT   ADMISSION DIAGNOSIS:  Rhegmatogenous retinal detachment in the right eye.  PROCEDURES: 1. Scleral buckle, right eye. 2. Retinal photocoagulation, right eye.  SURGEON:  Chrystie Nose. Zigmund Daniel, MD  ASSISTANT:  Deatra Ina, MA  ANESTHESIA:  General.  DETAILS:  Usual prep and drape, 360-degree limbal peritomy, isolation of 4 rectus muscles on 2-0 silk, scleral dissection from 2:30 o'clock around to 1:30 o'clock to admit #279 intrascleral implant.  Diathermy placed in the bed, 240 band placed around the eye with a 270 sleeve at 2 o'clock.  A perforation site was chosen in the posterior aspect of the bed at 8 o'clock.  A large amount of clear colorless subretinal fluid came forth in a slow controlled manner.  The buckle elements were placed and the scleral flaps were closed during egression of the subretinal fluid. When the subretinal fluid stopped, the scleral sutures were drawn securely, knotted, and the free ends were removed.  The buckle was adjusted and trimmed.  The band was adjusted and trimmed.  Indirect ophthalmoscopy showed the retina to be lying nicely on the scleral buckle with no subretinal fluid remaining.  The indirect ophthalmoscope laser was moved into place, 919 burns were placed around the retinal periphery.  The power of 400 milliwatts, 1000 microns each and 0.1 seconds each.  The conjunctiva was reposited with 7-0 chromic suture. Polymyxin and gentamicin were irrigated into [place] space.  Atropine solution was applied.  Marcaine was injected around the globe for postop pain.  Closing pressure was 10 with [place]quer tonometer.   Complications none.  Duration 2 hours.  The patient was awakened and taken to recovery in satisfactory condition.     Chrystie Nose. Zigmund Daniel, M.D.     JDM/MEDQ  D:  01/11/2011  T:  01/12/2011  Job:  [id number]  Electronically Signed by Tempie Hoist M.D. on 01/18/2011 07:23:46 AM

## 2011-02-09 ENCOUNTER — Telehealth: Payer: Self-pay | Admitting: *Deleted

## 2011-02-09 NOTE — Telephone Encounter (Signed)
What is surgery for?  If same as previous (retinal detachment), ok to clear with same precautions. If other type of surgery, likely needs office visit.

## 2011-02-09 NOTE — Telephone Encounter (Signed)
Pt was given surgical clearance in may for retinal surgery.  She is scheduled to have another surgery on 6/26 and has another clearance form that needs to be signed.  Can this be filled out without an office visit, since she had one last month?

## 2011-02-09 NOTE — Telephone Encounter (Signed)
Noted  

## 2011-02-09 NOTE — Telephone Encounter (Signed)
Patient advised it was the retinal detachment surgery for the other eye. I advised for her to drop the form off and it would be completed by Dr. Leo Grosser in Dr. Marliss Coots absence. She said she would drop it off today or Monday.

## 2011-02-12 ENCOUNTER — Ambulatory Visit: Payer: Medicare Other | Admitting: Physical Medicine & Rehabilitation

## 2011-02-12 ENCOUNTER — Encounter: Payer: Medicare Other | Attending: Physical Medicine & Rehabilitation

## 2011-02-12 ENCOUNTER — Telehealth: Payer: Self-pay | Admitting: *Deleted

## 2011-02-12 NOTE — Telephone Encounter (Signed)
Per Dr Damita Dunnings I called [facility], explained to them that Dr. Darnell Level said pt is cleared for surgery but he isnt here today to sign clearance form.   They said it will be ok if form is signed early tomorrow morning and faxed to their office.  Form placed in Dr. Synthia Innocent in box.

## 2011-02-12 NOTE — Telephone Encounter (Signed)
Pt needs form signed for surgical clearance, surgery is scheduled for tomorrow.  Form is on your desk.

## 2011-02-13 ENCOUNTER — Ambulatory Visit (HOSPITAL_COMMUNITY)
Admission: RE | Admit: 2011-02-13 | Discharge: 2011-02-14 | Disposition: A | Discharge status: Home / Self Care | Payer: Medicare Other | Source: Ambulatory Visit | Attending: Ophthalmology | Admitting: Ophthalmology

## 2011-02-13 DIAGNOSIS — R5381 Other malaise: Secondary | ICD-10-CM | POA: Insufficient documentation

## 2011-02-13 DIAGNOSIS — H33009 Unspecified retinal detachment with retinal break, unspecified eye: Secondary | ICD-10-CM | POA: Insufficient documentation

## 2011-02-13 DIAGNOSIS — Z961 Presence of intraocular lens: Secondary | ICD-10-CM | POA: Insufficient documentation

## 2011-02-13 DIAGNOSIS — I10 Essential (primary) hypertension: Secondary | ICD-10-CM | POA: Insufficient documentation

## 2011-02-13 DIAGNOSIS — I69998 Other sequelae following unspecified cerebrovascular disease: Secondary | ICD-10-CM | POA: Insufficient documentation

## 2011-02-13 DIAGNOSIS — E119 Type 2 diabetes mellitus without complications: Secondary | ICD-10-CM | POA: Insufficient documentation

## 2011-02-13 LAB — BASIC METABOLIC PANEL
BUN: 25 mg/dL — ABNORMAL HIGH (ref 6–23)
CO2: 26 mEq/L (ref 19–32)
Calcium: 9.3 mg/dL (ref 8.4–10.5)
Chloride: 108 mEq/L (ref 96–112)
Creatinine, Ser: 1.44 mg/dL — ABNORMAL HIGH (ref 0.50–1.10)
GFR calc Af Amer: 42 mL/min — ABNORMAL LOW (ref >60)
GFR calc non Af Amer: 35 mL/min — ABNORMAL LOW (ref >60)
Glucose, Bld: 102 mg/dL — ABNORMAL HIGH (ref 70–99)
Potassium: 4.2 mEq/L (ref 3.5–5.1)
Sodium: 143 mEq/L (ref 135–145)

## 2011-02-13 LAB — CBC
HCT: 34.7 % — ABNORMAL LOW (ref 36.0–46.0)
Hemoglobin: 11.6 g/dL — ABNORMAL LOW (ref 12.0–15.0)
MCH: 27.6 pg (ref 26.0–34.0)
MCHC: 33.4 g/dL (ref 30.0–36.0)
MCV: 82.4 fL (ref 78.0–100.0)
Platelets: 158 10*3/uL (ref 150–400)
RBC: 4.21 MIL/uL (ref 3.87–5.11)
RDW: 14.3 % (ref 11.5–15.5)
WBC: 4.7 10*3/uL (ref 4.0–10.5)

## 2011-02-13 LAB — GLUCOSE, CAPILLARY
Glucose-Capillary: 100 mg/dL — ABNORMAL HIGH (ref 70–99)
Glucose-Capillary: 104 mg/dL — ABNORMAL HIGH (ref 70–99)
Glucose-Capillary: 112 mg/dL — ABNORMAL HIGH (ref 70–99)
Glucose-Capillary: 185 mg/dL — ABNORMAL HIGH (ref 70–99)

## 2011-02-13 LAB — SURGICAL PCR SCREEN
MRSA, PCR: NEGATIVE
Staphylococcus aureus: NEGATIVE

## 2011-02-13 NOTE — Telephone Encounter (Signed)
Form filled and placed in my out box.

## 2011-02-13 NOTE — Telephone Encounter (Signed)
Form, copy of last EKG and office visit note faxed to hospital, by Pilar Grammes.

## 2011-02-14 LAB — GLUCOSE, CAPILLARY
Glucose-Capillary: 139 mg/dL — ABNORMAL HIGH (ref 70–99)
Glucose-Capillary: 169 mg/dL — ABNORMAL HIGH (ref 70–99)
Glucose-Capillary: 219 mg/dL — ABNORMAL HIGH (ref 70–99)

## 2011-02-18 HISTORY — PX: RETINAL DETACHMENT SURGERY: SHX105

## 2011-02-18 LAB — HM DIABETES EYE EXAM

## 2011-03-02 ENCOUNTER — Other Ambulatory Visit: Payer: Self-pay | Admitting: Family Medicine

## 2011-03-07 ENCOUNTER — Telehealth: Payer: Self-pay | Admitting: *Deleted

## 2011-03-07 NOTE — Telephone Encounter (Signed)
Completed form faxed to [phone] as instructed. Sent to be scanned.

## 2011-03-07 NOTE — Telephone Encounter (Signed)
Done in IN box 

## 2011-03-07 NOTE — Telephone Encounter (Signed)
Form for diabetic supplies is on your desk.  I checked with the patient and she does want these supplies.

## 2011-03-09 NOTE — Op Note (Signed)
  [name], Renee [name] NO.:  [id number]  MEDICAL RECORD NO.:  [id number]  LOCATION:  [id number]                         FACILITY:  [facility]  PHYSICIAN:  Chrystie Nose. Zigmund Daniel, M.D. DATE OF BIRTH:  Aug 17, 1929  DATE OF PROCEDURE:  02/13/2011 DATE OF DISCHARGE:                              OPERATIVE REPORT   ADMISSION DIAGNOSIS:  Recurrent rhegmatogenous retinal detachment with PVR.  PROCEDURES:  Pars plana vitrectomy, retinal photocoagulation, Perfluoron injection, gas-fluid exchange, Perfluoron removal, silicone oral injection all in the right eye.  SURGEON:  Chrystie Nose. Zigmund Daniel, MD  ASSISTANT:  Deatra Ina, MA  ANESTHESIA:  General.  DETAILS:  After usual prep and drape, 25-gauge trocars at 8 and 10, 19- gauge sclerotomy at 2 o'clock.  Provisc placed on the corneal surface and the BIOM viewing system moved into place.  Pars plana vitrectomy was begun just behind the pseudophakos.  Dense membranes and pigment granules were removed carefully from the mid vitreous cavity and out to the vitreous base.  The vitrectomy was carried posteriorly down to the retinal surface where membranes were removed from the retinal surface with a vitreous cutter.  An old break was seen, lifted up off the buckle at 4 o'clock, suctioned over the break allowed a thick gelatinous plug to come forward through the break and the retina immediately approached the wall of the eye and partial reattachment occurred.  Perfluoron was then injected with a double-bore cannula over the optic nerve head and fluid egressed through the break.  Once the Perfluoron was in place, all fluid was resolved from beneath the retina.  The endolaser was positioned in the eye, set 982 burns placed around the retinal periphery.  The power was 1000 milliwatts, 1000 microns each and 0.1 seconds each.  The gas-fluid exchange and gas Perfluoron exchange was then carried out.  A BSS rinse was used to be certain that all PFO  was removed.  Once the entire vitreous cavity was filled with gas and no PFO was remaining, the silicone oil was injected into the vitreous cavity. A complete fill was obtained.  A peripheral iridectomy was performed at 6 o'clock with the vitreous cutter.  The instruments were removed from the eye and the trocars were removed.  The intraocular pressure was measured and there was too much pressure in the eyes so silicone oil extraction was performed until the closing pressure was 10 with a Barraquer tonometer.  9-0 nylon was used to close the sclerotomy site. The silicone oil was in [facility], the [facility] was filled with gas.  The conjunctiva was closed with wet- field cautery.  Polymyxin and gentamicin were irrigated into [place] space.  Marcaine was injected around the globe for postop pain. Decadron 10 mg was injected into the lower subconjunctival space. Polysporin ophthalmic ointment, patch and shield were placed.  The patient was awakened and taken to recovery in satisfactory condition.     Chrystie Nose. Zigmund Daniel, M.D.     JDM/MEDQ  D:  02/13/2011  T:  02/14/2011  Job:  [id number]  Electronically Signed by Tempie Hoist M.D. on 03/09/2011 09:54:04 AM

## 2011-03-12 ENCOUNTER — Telehealth: Payer: Self-pay | Admitting: *Deleted

## 2011-03-12 NOTE — Telephone Encounter (Signed)
[employer] has faxed another form for diabetic supplies.  You just did this but they say a couple of things were left off.  Form is on your shelf.

## 2011-03-13 NOTE — Telephone Encounter (Signed)
I cannot find the form -- ? Did I do it already ?

## 2011-03-13 NOTE — Telephone Encounter (Signed)
Completed form faxed to [phone] as instructed. Copy sent for scanning.

## 2011-03-13 NOTE — Telephone Encounter (Signed)
Thanks done and in IN box

## 2011-03-13 NOTE — Telephone Encounter (Signed)
No, I was looking for copy of form you just sent in. Copy of form faxed on 03/07/11 and the new form is on your shelf. Thank you.

## 2011-03-19 ENCOUNTER — Inpatient Hospital Stay (INDEPENDENT_AMBULATORY_CARE_PROVIDER_SITE_OTHER): Payer: Medicare Other | Admitting: Ophthalmology

## 2011-03-19 DIAGNOSIS — H43819 Vitreous degeneration, unspecified eye: Secondary | ICD-10-CM

## 2011-03-19 DIAGNOSIS — H33009 Unspecified retinal detachment with retinal break, unspecified eye: Secondary | ICD-10-CM

## 2011-03-27 ENCOUNTER — Other Ambulatory Visit: Payer: Self-pay | Admitting: *Deleted

## 2011-03-27 MED ORDER — INSULIN GLARGINE 100 UNIT/ML ~~LOC~~ SOLN: 10.0000 [IU] | Freq: Every day | SUBCUTANEOUS | Status: DC

## 2011-03-28 ENCOUNTER — Other Ambulatory Visit (INDEPENDENT_AMBULATORY_CARE_PROVIDER_SITE_OTHER): Payer: Medicare Other | Admitting: Family Medicine

## 2011-03-28 DIAGNOSIS — E119 Type 2 diabetes mellitus without complications: Secondary | ICD-10-CM

## 2011-03-28 DIAGNOSIS — N259 Disorder resulting from impaired renal tubular function, unspecified: Secondary | ICD-10-CM

## 2011-03-28 DIAGNOSIS — E785 Hyperlipidemia, unspecified: Secondary | ICD-10-CM

## 2011-03-28 LAB — RENAL FUNCTION PANEL
Albumin: 3.5 g/dL (ref 3.5–5.2)
BUN: 27 mg/dL — ABNORMAL HIGH (ref 6–23)
CO2: 30 mEq/L (ref 19–32)
Calcium: 9.3 mg/dL (ref 8.4–10.5)
Chloride: 109 mEq/L (ref 96–112)
Creatinine, Ser: 1.6 mg/dL — ABNORMAL HIGH (ref 0.4–1.2)
GFR: 33.2 mL/min — ABNORMAL LOW (ref >60.00)
Glucose, Bld: 98 mg/dL (ref 70–99)
Phosphorus: 3.7 mg/dL (ref 2.3–4.6)
Potassium: 4.6 mEq/L (ref 3.5–5.1)
Sodium: 145 mEq/L (ref 135–145)

## 2011-03-28 LAB — LIPID PANEL
Cholesterol: 182 mg/dL (ref 0–200)
HDL: 36.8 mg/dL — ABNORMAL LOW (ref >39.00)
LDL Cholesterol: 114 mg/dL — ABNORMAL HIGH (ref 0–99)
Total CHOL/HDL Ratio: 5
Triglycerides: 158 mg/dL — ABNORMAL HIGH (ref 0.0–149.0)
VLDL: 31.6 mg/dL (ref 0.0–40.0)

## 2011-03-28 LAB — HM MAMMOGRAPHY: HM Mammogram: NEGATIVE

## 2011-03-28 LAB — HEMOGLOBIN A1C: Hgb A1c MFr Bld: 5.9 % (ref 4.6–6.5)

## 2011-03-30 ENCOUNTER — Other Ambulatory Visit: Payer: Self-pay | Admitting: *Deleted

## 2011-04-02 ENCOUNTER — Other Ambulatory Visit: Payer: Self-pay

## 2011-04-02 MED ORDER — HYDRALAZINE HCL 50 MG PO TABS: 50.0000 mg | ORAL_TABLET | Freq: Three times a day (TID) | ORAL | Status: DC

## 2011-04-02 NOTE — Telephone Encounter (Signed)
[employer] faxed refill for Hydralazine 25 mg #270 x0. Faxed refill sheet was faxed back to [phone] as instructed. Med list updated.

## 2011-04-04 ENCOUNTER — Encounter: Payer: Self-pay | Admitting: Family Medicine

## 2011-04-04 ENCOUNTER — Ambulatory Visit (INDEPENDENT_AMBULATORY_CARE_PROVIDER_SITE_OTHER): Payer: Medicare Other | Admitting: Family Medicine

## 2011-04-04 ENCOUNTER — Other Ambulatory Visit: Payer: Self-pay | Admitting: Family Medicine

## 2011-04-04 DIAGNOSIS — I10 Essential (primary) hypertension: Secondary | ICD-10-CM

## 2011-04-04 DIAGNOSIS — H3320 Serous retinal detachment, unspecified eye: Secondary | ICD-10-CM

## 2011-04-04 DIAGNOSIS — E538 Deficiency of other specified B group vitamins: Secondary | ICD-10-CM

## 2011-04-04 DIAGNOSIS — F3289 Other specified depressive episodes: Secondary | ICD-10-CM

## 2011-04-04 DIAGNOSIS — F329 Major depressive disorder, single episode, unspecified: Secondary | ICD-10-CM

## 2011-04-04 DIAGNOSIS — N259 Disorder resulting from impaired renal tubular function, unspecified: Secondary | ICD-10-CM

## 2011-04-04 DIAGNOSIS — E119 Type 2 diabetes mellitus without complications: Secondary | ICD-10-CM

## 2011-04-04 DIAGNOSIS — E785 Hyperlipidemia, unspecified: Secondary | ICD-10-CM

## 2011-04-04 MED ORDER — CYANOCOBALAMIN 1000 MCG/ML IJ SOLN
1000.0000 ug | Freq: Once | INTRAMUSCULAR | Status: AC
  Administered 2011-04-04: 1000 ug via INTRAMUSCULAR

## 2011-04-04 NOTE — Assessment & Plan Note (Signed)
Improvement - overall in fairly good control Rev lab with pt  Is eating low sat fat diet  Lopid is controlling triglycerides well

## 2011-04-04 NOTE — Telephone Encounter (Signed)
[employer] request refill Gemfibrozil 600mg  #90 x 3.

## 2011-04-04 NOTE — Assessment & Plan Note (Signed)
Is well controlled Renal doctor reduced amlodipine to 5 mg  Continue to follow  Lab rev Cr is now 1.6

## 2011-04-04 NOTE — Assessment & Plan Note (Signed)
Great control with small dose of lantus Has frequent eye exams with no retinopathy Good diet - enc to eat more frequently No low sugars F/u 6 mo

## 2011-04-04 NOTE — Progress Notes (Signed)
Letter mailed to pt with negative screening mammogram done on 03/28/11.

## 2011-04-04 NOTE — Assessment & Plan Note (Signed)
Due for her 2 month shot today  No change in clinical picture

## 2011-04-04 NOTE — Progress Notes (Signed)
Subjective:    Patient ID: Renee [name], female    DOB: 10/12/1928, 77 y.o.   MRN: [id number]  HPI Here for f/u of DM and depression and hyperlipidemia (also in pt with renal insuff) Is feeling ok overall   No change in wt   On small dose of lantus for DM  a1c is 5.9- great  Regular opthy  Just had retinal sx for detatchment -- 2 surgeries for that -- is seeing a bit better and will get new glasses soon  Was a lot to go through- zapped her completely   zoloft for depression Got started back on that  No side effects Has improved mood and appetite    Cr is 1.6 Needs to drink more water - renal doctor stressed that  Saw renal - and there was concern over her weight loss (though wt is stable from last time in may) He changed amlodipine from 10 to 5 mg  Goes back in 4 months  Renal f/u  LDL 114 Chol stable Lab Results  Component Value Date   CHOL 182 03/28/2011   CHOL 188 12/27/2010   CHOL 172 08/22/2010   Lab Results  Component Value Date   HDL 36.80* 03/28/2011   HDL 31.90* 12/27/2010   HDL 29.50* 08/22/2010   Lab Results  Component Value Date   LDLCALC 114* 03/28/2011   LDLCALC 131* 12/27/2010   LDLCALC 118* 08/22/2010   Lab Results  Component Value Date   TRIG 158.0* 03/28/2011   TRIG 128.0 12/27/2010   TRIG 124.0 08/22/2010   Lab Results  Component Value Date   CHOLHDL 5 03/28/2011   CHOLHDL 6 12/27/2010   CHOLHDL 6 08/22/2010   No results found for this basename: LDLDIRECT   with lopid and diet control  HTN well controlled 130/60 No cp or ha or palpitatoins   Patient Active Problem List  Diagnoses  . POSTHERPETIC NEURALGIA  . DIABETES MELLITUS, TYPE II  . B12 DEFICIENCY  . HYPERLIPIDEMIA  . HYPERTENSION  . CVA WITH LEFT HEMIPARESIS  . RENAL INSUFFICIENCY  . FIBROCYSTIC BREAST DISEASE  . ROSACEA  . OSTEOARTHRITIS  . BACK PAIN  . MUSCLE WEAKNESS (GENERALIZED)  . OSTEOPENIA  . EDEMA  . URINARY INCONTINENCE, MIXED  . ANGIOEDEMA  . DEPRESSION  . Sleep apnea  .  Retinal detachment   Past Medical History  Diagnosis Date  . Sleep apnea   . Diabetes mellitus     type II  . Hypertension   . Hyperlipidemia   . Stroke     Small vessel sobcortical (in [place] trial) with Dr Leonie Man  . Osteoarthritis   . Angioedema     possibly from voltaren  . Vitamin B 12 deficiency 04/08  . LVH (left ventricular hypertrophy)     and atrial enlargement by echo in past with nl EF  . Degenerative disc disease   . Osteopenia   . Nasal pruritis   . Renal insufficiency    Past Surgical History  Procedure Date  . Eye surgery     cataract extraction  . Abdominal hysterectomy     BSO-fibroids  . Knee surgery     arthroscope  . Spine surgery 08/09    spinal decompression surgery  . Retinal detachment surgery 02/18/11    times 2   History  Substance Use Topics  . Smoking status: Never Smoker   . Smokeless tobacco: Not on file  . Alcohol Use: No   Family History  Problem Relation  Age of Onset  . Cancer Sister     brain tumor with hemmorhage  . Heart disease Sister     CAD  . COPD Brother    Allergies  Allergen Reactions  . Diclofenac Sodium     REACTION: angioedema  . Pioglitazone     REACTION: swelling   Current Outpatient Prescriptions on File Prior to Visit  Medication Sig Dispense Refill  . aspirin 325 MG tablet Take 325 mg by mouth daily.        . calcium-vitamin D (OSCAL WITH D) 500-200 MG-UNIT per tablet Take 1 tablet by mouth daily.        . Cholecalciferol (VITAMIN D) 1000 UNITS capsule Take 1,000 Units by mouth daily.        . cyanocobalamin (,VITAMIN B-12,) 1000 MCG/ML injection Inject 1,000 mcg into the muscle every 8 (eight) weeks.        . hydrALAZINE (APRESOLINE) 50 MG tablet Take 1 tablet (50 mg total) by mouth 3 (three) times daily.  270 tablet  0  . insulin glargine (LANTUS) 100 UNIT/ML injection Inject 10 Units into the skin daily. Or as directed.  10 mL  6  . metoprolol succinate (TOPROL-XL) 25 MG 24 hr tablet 1/2 tablet by mouth  twice a day       . mirtazapine (REMERON) 15 MG tablet 1/2 pill by mouth each bedtime   45 tablet  3  . Multiple Vitamin (MULTIVITAMIN) capsule Take 1 capsule by mouth daily.        Marland Kitchen oxybutynin (DITROPAN-XL) 10 MG 24 hr tablet Take 10 mg by mouth daily.        . sertraline (ZOLOFT) 50 MG tablet Take 1 tablet (50 mg total) by mouth daily.  30 tablet  2  . trandolapril (MAVIK) 4 MG tablet TAKE 1 TABLET ONCE DAILY  90 tablet  1      Review of Systems Review of Systems  Constitutional: Negative for fever,, fatigue and unexpected weight change. appetite is improved some but not totally back, wt is unchanged from last visit  Eyes: Negative for pain and visual disturbance.  Respiratory: Negative for cough and shortness of breath.   Cardiovascular: Negative. For cp or palpitations   Gastrointestinal: Negative for nausea, diarrhea and constipation.  Genitourinary: Negative for urgency and frequency.  Skin: Negative for pallor or rash .  Neurological: Negative for weakness, light-headedness, numbness and headaches.  Hematological: Negative for adenopathy. Does not bruise/bleed easily.  Psychiatric/Behavioral:overall depression is improved and not anxious        Objective:   Physical Exam  Constitutional: She appears well-developed and well-nourished. No distress.  HENT:  Head: Normocephalic and atraumatic.  Mouth/Throat: Oropharynx is clear and moist.       Baseline facial droop Speech is clear today  Eyes: Conjunctivae and EOM are normal. Pupils are equal, round, and reactive to light.  Neck: Normal range of motion. Neck supple. No JVD present. Carotid bruit is not present. No thyromegaly present.  Cardiovascular: Normal rate, regular rhythm and normal heart sounds.   Pulmonary/Chest: Effort normal and breath sounds normal. No respiratory distress. She has no wheezes.  Abdominal: Soft. Bowel sounds are normal. She exhibits no distension and no mass. There is no tenderness.    Musculoskeletal: Normal range of motion. She exhibits no edema and no tenderness.  Lymphadenopathy:    She has no cervical adenopathy.  Neurological: She is alert. A cranial nerve deficit is present. She exhibits normal muscle tone.  L hemiparesis is improved Still not full strength in L arm and hand Pt can ambulate and get on table   Skin: Skin is warm and dry. No rash noted. No erythema. No pallor.  Psychiatric: She has a normal mood and affect.       imporved affect Good eye contact and communication skills           Assessment & Plan:

## 2011-04-04 NOTE — Assessment & Plan Note (Signed)
Pt notices improvement with addn of zoloft to the remeron  Appetite has improved (although not back to nl) and her wt loss has stopped since may  Will continue to monitor closely  Pt wants to come off med but I disagree since doing better with no side eff

## 2011-04-04 NOTE — Assessment & Plan Note (Signed)
Cr here is up to 1.6 Will send copy to renal doctor  Disc imp of water intake -- how imp it is  Pt admits does not drink much fluid  Will send copy of labs to renal

## 2011-04-04 NOTE — Assessment & Plan Note (Signed)
After 2 surgeries finally improving For new glasses soon

## 2011-04-04 NOTE — Patient Instructions (Signed)
Labs look good today  B12 shot today  Stay active- you are doing well with that  Weight is stable from may -- so I think the antidepressant medications are helping that  Sometimes you have to eat even if you are not hungry  Schedule follow up in 6 months with labs prior

## 2011-04-05 ENCOUNTER — Encounter: Payer: Self-pay | Admitting: Family Medicine

## 2011-04-05 NOTE — Progress Notes (Signed)
Opened encounter to quick abstract mammogram.

## 2011-04-05 NOTE — Telephone Encounter (Signed)
Error

## 2011-04-12 ENCOUNTER — Encounter: Payer: Self-pay | Admitting: Family Medicine

## 2011-05-18 LAB — URINALYSIS, ROUTINE W REFLEX MICROSCOPIC
Bilirubin Urine: NEGATIVE
Bilirubin Urine: NEGATIVE
Glucose, UA: NEGATIVE
Glucose, UA: NEGATIVE
Hgb urine dipstick: NEGATIVE
Hgb urine dipstick: NEGATIVE
Ketones, ur: NEGATIVE
Ketones, ur: NEGATIVE
Nitrite: NEGATIVE
Nitrite: NEGATIVE
Protein, ur: NEGATIVE
Protein, ur: NEGATIVE
Specific Gravity, Urine: 1.021
Specific Gravity, Urine: 1.028
Urobilinogen, UA: 0.2
Urobilinogen, UA: 0.2
pH: 5
pH: 5.5

## 2011-05-18 LAB — GLUCOSE, CAPILLARY
Glucose-Capillary: 118 — ABNORMAL HIGH
Glucose-Capillary: 131 — ABNORMAL HIGH
Glucose-Capillary: 134 — ABNORMAL HIGH
Glucose-Capillary: 138 — ABNORMAL HIGH
Glucose-Capillary: 143 — ABNORMAL HIGH
Glucose-Capillary: 197 — ABNORMAL HIGH
Glucose-Capillary: 209 — ABNORMAL HIGH
Glucose-Capillary: 227 — ABNORMAL HIGH

## 2011-05-18 LAB — CBC
HCT: 32.2 — ABNORMAL LOW
HCT: 37.7
Hemoglobin: 10.6 — ABNORMAL LOW
Hemoglobin: 12.8
MCHC: 32.9
MCHC: 33.9
MCV: 82
MCV: 83.9
Platelets: 167
Platelets: 185
RBC: 3.84 — ABNORMAL LOW
RBC: 4.6
RDW: 13.8
RDW: 13.9
WBC: 4.4
WBC: 6.7

## 2011-05-18 LAB — COMPREHENSIVE METABOLIC PANEL
ALT: 15
AST: 16
Albumin: 3.4 — ABNORMAL LOW
Alkaline Phosphatase: 79
BUN: 27 — ABNORMAL HIGH
CO2: 29
Calcium: 9.3
Chloride: 108
Creatinine, Ser: 1.33 — ABNORMAL HIGH
GFR calc Af Amer: 47 — ABNORMAL LOW
GFR calc non Af Amer: 39 — ABNORMAL LOW
Glucose, Bld: 147 — ABNORMAL HIGH
Potassium: 4.5
Sodium: 144
Total Bilirubin: 0.9
Total Protein: 5.9 — ABNORMAL LOW

## 2011-05-18 LAB — BASIC METABOLIC PANEL
BUN: 14
CO2: 27
Calcium: 8 — ABNORMAL LOW
Chloride: 109
Creatinine, Ser: 1.06
GFR calc Af Amer: >60
GFR calc non Af Amer: 50 — ABNORMAL LOW
Glucose, Bld: 107 — ABNORMAL HIGH
Potassium: 3.8
Sodium: 140

## 2011-05-18 LAB — URINE MICROSCOPIC-ADD ON

## 2011-05-18 LAB — PROTIME-INR
INR: 1.1
Prothrombin Time: 14

## 2011-05-18 LAB — APTT: aPTT: 34

## 2011-05-22 ENCOUNTER — Encounter (INDEPENDENT_AMBULATORY_CARE_PROVIDER_SITE_OTHER): Payer: Medicare Other | Admitting: Ophthalmology

## 2011-05-22 DIAGNOSIS — E11319 Type 2 diabetes mellitus with unspecified diabetic retinopathy without macular edema: Secondary | ICD-10-CM

## 2011-05-22 DIAGNOSIS — H3530 Unspecified macular degeneration: Secondary | ICD-10-CM

## 2011-05-22 DIAGNOSIS — H43819 Vitreous degeneration, unspecified eye: Secondary | ICD-10-CM

## 2011-05-22 DIAGNOSIS — H33009 Unspecified retinal detachment with retinal break, unspecified eye: Secondary | ICD-10-CM

## 2011-05-31 ENCOUNTER — Ambulatory Visit: Payer: Medicare Other

## 2011-06-14 ENCOUNTER — Ambulatory Visit: Payer: Medicare Other

## 2011-06-26 ENCOUNTER — Ambulatory Visit (INDEPENDENT_AMBULATORY_CARE_PROVIDER_SITE_OTHER): Payer: Medicare Other | Admitting: Family Medicine

## 2011-06-26 ENCOUNTER — Encounter: Payer: Self-pay | Admitting: Family Medicine

## 2011-06-26 VITALS — BP 136/74 | HR 52 | Temp 97.8°F | Wt 179.4 lb

## 2011-06-26 DIAGNOSIS — Z7409 Other reduced mobility: Secondary | ICD-10-CM

## 2011-06-26 DIAGNOSIS — R69 Illness, unspecified: Secondary | ICD-10-CM

## 2011-06-26 DIAGNOSIS — E538 Deficiency of other specified B group vitamins: Secondary | ICD-10-CM

## 2011-06-26 DIAGNOSIS — I69959 Hemiplegia and hemiparesis following unspecified cerebrovascular disease affecting unspecified side: Secondary | ICD-10-CM

## 2011-06-26 MED ORDER — CYANOCOBALAMIN 1000 MCG/ML IJ SOLN
1000.0000 ug | Freq: Once | INTRAMUSCULAR | Status: AC
  Administered 2011-06-26: 1000 ug via INTRAMUSCULAR

## 2011-06-26 MED ORDER — AMLODIPINE BESYLATE 5 MG PO TABS: 5.0000 mg | ORAL_TABLET | Freq: Every day | ORAL | Status: DC

## 2011-06-26 MED ORDER — GEMFIBROZIL 600 MG PO TABS: 600.0000 mg | ORAL_TABLET | Freq: Every day | ORAL | Status: DC

## 2011-06-26 MED ORDER — METOPROLOL SUCCINATE ER 25 MG PO TB24: ORAL_TABLET | ORAL | Status: DC

## 2011-06-26 MED ORDER — MIRTAZAPINE 15 MG PO TABS: ORAL_TABLET | ORAL | Status: DC

## 2011-06-26 MED ORDER — OXYBUTYNIN CHLORIDE ER 10 MG PO TB24: 10.0000 mg | ORAL_TABLET | Freq: Every day | ORAL | Status: DC

## 2011-06-26 MED ORDER — INSULIN GLARGINE 100 UNIT/ML ~~LOC~~ SOLN: 10.0000 [IU] | Freq: Every day | SUBCUTANEOUS | Status: DC

## 2011-06-26 MED ORDER — TRANDOLAPRIL 4 MG PO TABS: 4.0000 mg | ORAL_TABLET | Freq: Every day | ORAL | Status: DC

## 2011-06-26 MED ORDER — HYDRALAZINE HCL 50 MG PO TABS: 50.0000 mg | ORAL_TABLET | Freq: Three times a day (TID) | ORAL | Status: DC

## 2011-06-26 NOTE — Patient Instructions (Addendum)
I will do a mobility note for you in hopes we could get a power chair covered  For now - I want you to always use your walker when outdoors or going to the mailbox -- for safety  I sent your px to [employer] [facility]  B12 shot today

## 2011-06-26 NOTE — Progress Notes (Signed)
Subjective:    Patient ID: Renee [name], female    DOB: 05-Dec-1928, 77 y.o.   MRN: [id number]  HPI Here for a mobility examination  "they" have been calling for a motorized chair -- ? Name of company   She would like one  She is unable to get down the stairs into her basement - a big hassle - freezer is there  With a motorized chair - she could go outside and come into basement through another door  Could also use to get to mailbox- across the road- is not safe      Cannot go up and down stairs  Walks unassisted right now - but pretty slow and unsteady  She is at great fall risk and husband would not be able to help her  2 falls right after she had the stroke - one included a retinal detatchment   Would use mostly outside  Has a ramp at the back door  Does not own a regular wheelchair  Does not use her walker   She does think the chair would fit in her house  Does not need elevated foot rest or reclining back      CVA in past with L hemiparesis  Has greatly affected gait  Lost strength in L hand/arm as well as leg  Also the stroke caused lack of balance   Patient Active Problem List  Diagnoses  . POSTHERPETIC NEURALGIA  . DIABETES MELLITUS, TYPE II  . B12 DEFICIENCY  . HYPERLIPIDEMIA  . HYPERTENSION  . CVA WITH LEFT HEMIPARESIS  . RENAL INSUFFICIENCY  . FIBROCYSTIC BREAST DISEASE  . ROSACEA  . OSTEOARTHRITIS  . BACK PAIN  . MUSCLE WEAKNESS (GENERALIZED)  . OSTEOPENIA  . EDEMA  . URINARY INCONTINENCE, MIXED  . ANGIOEDEMA  . DEPRESSION  . Sleep apnea  . Retinal detachment  . Mobility impaired   Past Medical History  Diagnosis Date  . Sleep apnea   . Diabetes mellitus     type II  . Hypertension   . Hyperlipidemia   . Stroke     Small vessel sobcortical (in [place] trial) with Dr Leonie Man  . Osteoarthritis   . Angioedema     possibly from voltaren  . Vitamin B 12 deficiency 04/08  . LVH (left ventricular hypertrophy)     and atrial enlargement by  echo in past with nl EF  . Degenerative disc disease   . Osteopenia   . Nasal pruritis   . Renal insufficiency    Past Surgical History  Procedure Date  . Eye surgery     cataract extraction  . Abdominal hysterectomy     BSO-fibroids  . Knee surgery     arthroscope  . Spine surgery 08/09    spinal decompression surgery  . Retinal detachment surgery 02/18/11    times 2   History  Substance Use Topics  . Smoking status: Never Smoker   . Smokeless tobacco: Not on file  . Alcohol Use: No   Family History  Problem Relation Age of Onset  . Cancer Sister     brain tumor with hemmorhage  . Heart disease Sister     CAD  . COPD Brother    Allergies  Allergen Reactions  . Diclofenac Sodium     REACTION: angioedema  . Pioglitazone     REACTION: swelling   Current Outpatient Prescriptions on File Prior to Visit  Medication Sig Dispense Refill  . aspirin 325 MG tablet Take 325  mg by mouth daily.        . calcium-vitamin D (OSCAL WITH D) 500-200 MG-UNIT per tablet Take 1 tablet by mouth daily.        . Cholecalciferol (VITAMIN D) 1000 UNITS capsule Take 1,000 Units by mouth daily.        . cyanocobalamin (,VITAMIN B-12,) 1000 MCG/ML injection Inject 1,000 mcg into the muscle every 8 (eight) weeks.        . Multiple Vitamin (MULTIVITAMIN) capsule Take 1 capsule by mouth daily.        . sertraline (ZOLOFT) 50 MG tablet Take 1 tablet (50 mg total) by mouth daily.  30 tablet  2        Review of Systems Review of Systems  Constitutional: Negative for fever, appetite change, fatigue and unexpected weight change.  Eyes: Negative for pain and visual disturbance.  Respiratory: Negative for cough and shortness of breath.   Cardiovascular: Negative for cp or palpitations    Gastrointestinal: Negative for nausea, diarrhea and constipation.  Genitourinary: Negative for urgency and frequency.  Skin: Negative for pallor or rash   Neurological: Negative for weakness, light-headedness,  numbness and headaches. pos for weakness and slow speech  Hematological: Negative for adenopathy. Does not bruise/bleed easily.  Psychiatric/Behavioral: Negative for dysphoric mood. The patient is not nervous/anxious.          Objective:   Physical Exam  Constitutional: She is oriented to person, place, and time. She appears well-developed and well-nourished. No distress.       Frail appearing elderly female   HENT:  Head: Normocephalic and atraumatic.  Mouth/Throat: Oropharynx is clear and moist.  Eyes: Conjunctivae and EOM are normal. Pupils are equal, round, and reactive to light.  Neck: Normal range of motion. Neck supple. No JVD present. Carotid bruit is not present. No thyromegaly present.  Cardiovascular: Normal rate, regular rhythm, normal heart sounds and intact distal pulses.  Exam reveals no gallop.   Pulmonary/Chest: Effort normal and breath sounds normal. No respiratory distress. She exhibits no tenderness.  Abdominal: Soft. Bowel sounds are normal. She exhibits no distension, no abdominal bruit and no mass. There is no tenderness.  Musculoskeletal: She exhibits edema. She exhibits no tenderness.       Mild pedal edema today  Lymphadenopathy:    She has no cervical adenopathy.  Neurological: She is alert and oriented to person, place, and time. She displays no atrophy and no tremor. A cranial nerve deficit is present. No sensory deficit. She exhibits normal muscle tone. She displays no seizure activity. Coordination and gait abnormal. She displays no Babinski's sign on the right side. She displays no Babinski's sign on the left side.  Reflex Scores:      Tricep reflexes are 2+ on the right side and 1+ on the left side.      Bicep reflexes are 2+ on the right side and 1+ on the left side.      Brachioradialis reflexes are 2+ on the right side and 2+ on the left side.      Patellar reflexes are 2+ on the right side and 1+ on the left side.      Achilles reflexes are 2+ on the  right side and 1+ on the left side.      Baseline facial droop  Wide based slow gait without assistance Cannot rise from chair without using arms  Can rise from chair using arms without assistance  Speech is slow but intelligible  Falls backward  with rhonberg  5/5 strength in all areas on R 4/5 strength in all areas on L (3/5 for grip on L)  Skin: Skin is warm and dry. No rash noted. No erythema. No pallor.  Psychiatric: She has a normal mood and affect.          Assessment & Plan:

## 2011-06-28 NOTE — Assessment & Plan Note (Signed)
Doing better with supplementation Shot today

## 2011-06-28 NOTE — Assessment & Plan Note (Signed)
This limits her ability to get around the house or down the stairs Desires motorized wheelchair because she cannot manually push one herself This is mainly for ambulation outdoors and so she can get into her basement from the outside without using stairs I stressed firmly that with her loss of balance she should use walker at all times when walking

## 2011-06-28 NOTE — Assessment & Plan Note (Addendum)
Due to CVA with L hemiparesis  Desires a motorized wheelchair  See assessment for cva  Over 35 minutes spent wit h pt for discussion and mobility eval

## 2011-07-03 ENCOUNTER — Telehealth: Payer: Self-pay | Admitting: Family Medicine

## 2011-07-03 NOTE — Telephone Encounter (Signed)
I did see her for that eval , did not have any paperwork to fill out so please just send her a copy of that visit/ note-thanks

## 2011-07-03 NOTE — Telephone Encounter (Signed)
[name] with [facility] [employer] called re: patient's Nov 6th mobility eval for power wheelchair.  Called to check on status of paperwork and requesting call back at [phone] ext [phone].

## 2011-07-04 NOTE — Telephone Encounter (Signed)
Renee [name] called back.  She does need paperwork completed for insurance reasons, says she faxed it over on 10/25, she will refax it.

## 2011-07-04 NOTE — Telephone Encounter (Signed)
I will do it when it gets here

## 2011-07-04 NOTE — Telephone Encounter (Signed)
[name] with [facility] notified as instructed by telephone. 06/26/11 office visit faxed to [phone] atten [name].

## 2011-07-05 ENCOUNTER — Telehealth: Payer: Self-pay | Admitting: *Deleted

## 2011-07-05 NOTE — Telephone Encounter (Signed)
Paperwork for mobility evaluation is on your shelf.

## 2011-07-06 NOTE — Telephone Encounter (Signed)
Done and in IN box 

## 2011-07-06 NOTE — Telephone Encounter (Signed)
Forms faxed and scanned.

## 2011-07-20 ENCOUNTER — Telehealth: Payer: Self-pay | Admitting: Family Medicine

## 2011-07-20 NOTE — Telephone Encounter (Signed)
Tell her to relax for an hour as long as she is feeling ok - then check bp again -- if it goes higher - go to UC or call for sat clinic appt tomorrow Otherwise f/u with me next week  If palpitations return - also seek care

## 2011-07-20 NOTE — Telephone Encounter (Signed)
Patient notified as instructed by telephone. I gave Sat clinic phone # to pt also.

## 2011-07-20 NOTE — Telephone Encounter (Signed)
Pt was at doctors office this AM with pts husband and had her BP taken This AM BP was 127/44. Pt heart raced yesterday but not today. Pt has no chest pain, sob, h/a,or dizziness. Pt took BP while on phone and BP now is 176/93 with pulse rate of 62. Pt is taking Amlodipine 5 mg one daily, Mavik 4 mg one daily and Hydralazine 50 mg one twice a day (Dr Holley Raring nephrologist decreased pt from taking one three times a day). Pt has not missed any doses of med. Pt uses [facility].Please advise. Pt can be reached at [phone].

## 2011-07-21 ENCOUNTER — Emergency Department (INDEPENDENT_AMBULATORY_CARE_PROVIDER_SITE_OTHER)
Admission: EM | Admit: 2011-07-21 | Discharge: 2011-07-21 | Disposition: A | Discharge status: Home / Self Care | Payer: Medicare Other | Source: Home / Self Care | Attending: Emergency Medicine | Admitting: Emergency Medicine

## 2011-07-21 ENCOUNTER — Encounter (HOSPITAL_COMMUNITY): Payer: Self-pay | Admitting: Emergency Medicine

## 2011-07-21 DIAGNOSIS — I10 Essential (primary) hypertension: Secondary | ICD-10-CM

## 2011-07-21 LAB — POCT I-STAT, CHEM 8
BUN: 23 mg/dL (ref 6–23)
Calcium, Ion: 1.16 mmol/L (ref 1.12–1.32)
Chloride: 103 mEq/L (ref 96–112)
Creatinine, Ser: 1.4 mg/dL — ABNORMAL HIGH (ref 0.50–1.10)
Glucose, Bld: 128 mg/dL — ABNORMAL HIGH (ref 70–99)
HCT: 36 % (ref 36.0–46.0)
Hemoglobin: 12.2 g/dL (ref 12.0–15.0)
Potassium: 4.1 mEq/L (ref 3.5–5.1)
Sodium: 140 mEq/L (ref 135–145)
TCO2: 27 mmol/L (ref 0–100)

## 2011-07-21 NOTE — ED Notes (Addendum)
Notified dr Alphonzo Cruise of vital signs .

## 2011-07-21 NOTE — ED Notes (Signed)
Reports staff member at cancer center took blood pressure yesterday--just checking bp, was not feeling bad.  Staff member reported 127/44.  At home, patient checked with her machine and reading was 196/77.  Denies feeling any different, no pain, no weakness.

## 2011-07-21 NOTE — ED Provider Notes (Signed)
History     CSN: [id number] Arrival date & time: 07/21/2011  2:03 PM   First MD Initiated Contact with Patient 07/21/11 1343      Chief Complaint  Patient presents with  . Hypertension   HPI Comments: Pt here for BP evaluation. Had BP measured yesterday while asxzatic and was found to be high. Has mesured it multiple times over past day and states has been high - the highest 190's/90's States her normal BP is 130's/70's. Pt is rx'd hydralazine tid but pt states she decreased the hydralazine to bid on her own several months ago but is otherwise taking the metoprolol 12.5 mg daily bid, amlodipine 5 mg once daily, trandolapril 4 mg once states her kidney doctor decreaed the amlodipine from 10 to 5 once daily about a month ago.  Denies change in diet, excess caffeine intake, drugs, OTC cold medications. Pt w/o HA, CP, blurry vision, abd pain, anuria, LE edema, focal neurological deficits during this time. Pt asxatic here.   Patient is a 77 y.o. female presenting with hypertension. The history is provided by the patient.  Hypertension The current episode started yesterday. The problem has not changed since onset.Pertinent negatives include no chest pain, no abdominal pain, no headaches and no shortness of breath. The symptoms are aggravated by nothing. The symptoms are relieved by nothing. She has tried nothing for the symptoms.    Past Medical History  Diagnosis Date  . Sleep apnea   . Diabetes mellitus     type II  . Hypertension   . Hyperlipidemia   . Stroke     Small vessel sobcortical (in [place] trial) with Dr Leonie Man  . Osteoarthritis   . Angioedema     possibly from voltaren  . Vitamin B 12 deficiency 04/08  . LVH (left ventricular hypertrophy)     and atrial enlargement by echo in past with nl EF  . Degenerative disc disease   . Osteopenia   . Nasal pruritis   . Renal insufficiency     Past Surgical History  Procedure Date  . Eye surgery     cataract extraction  . Abdominal  hysterectomy     BSO-fibroids  . Knee surgery     arthroscope  . Spine surgery 08/09    spinal decompression surgery  . Retinal detachment surgery 02/18/11    times 2    Family History  Problem Relation Age of Onset  . Cancer Sister     brain tumor with hemmorhage  . Heart disease Sister     CAD  . COPD Brother     History  Substance Use Topics  . Smoking status: Never Smoker   . Smokeless tobacco: Not on file  . Alcohol Use: No    OB History    Grav Para Term Preterm Abortions TAB SAB Ect Mult Living                  Review of Systems  Constitutional: Negative for fever.  HENT: Negative for neck pain.   Eyes: Negative for photophobia and visual disturbance.  Respiratory: Negative for chest tightness and shortness of breath.   Cardiovascular: Negative for chest pain.  Gastrointestinal: Negative for abdominal pain.  Musculoskeletal: Negative for back pain.  Neurological: Negative for seizures, syncope, speech difficulty, weakness, numbness and headaches.    Allergies  Diclofenac sodium and Pioglitazone  Home Medications   Current Outpatient Rx  Name Route Sig Dispense Refill  . AMLODIPINE BESYLATE 5 MG PO  TABS Oral Take 1 tablet (5 mg total) by mouth daily. Kidney doctor Dr Holley Raring started 04/03/11. 90 tablet 3  . ASPIRIN 325 MG PO TABS Oral Take 325 mg by mouth daily.      Marland Kitchen CALCIUM CARBONATE-VITAMIN D 500-200 MG-UNIT PO TABS Oral Take 1 tablet by mouth daily.      Marland Kitchen VITAMIN D 1000 UNITS PO CAPS Oral Take 1,000 Units by mouth daily.      . CYANOCOBALAMIN 1000 MCG/ML IJ SOLN Intramuscular Inject 1,000 mcg into the muscle every 8 (eight) weeks.      Marland Kitchen GEMFIBROZIL 600 MG PO TABS Oral Take 1 tablet (600 mg total) by mouth daily. 90 tablet 3  . HYDRALAZINE HCL 50 MG PO TABS Oral Take 1 tablet (50 mg total) by mouth 3 (three) times daily. 270 tablet 3  . INSULIN GLARGINE 100 UNIT/ML [place] SOLN Subcutaneous Inject 10 Units into the skin daily. Or as directed. 10 mL 3  .  METOPROLOL SUCCINATE ER 25 MG PO TB24  1/2 tablet by mouth twice a day 90 tablet 3  . MIRTAZAPINE 15 MG PO TABS  1/2 pill by mouth each bedtime  45 tablet 3  . MULTIVITAMINS PO CAPS Oral Take 1 capsule by mouth daily.      . OXYBUTYNIN CHLORIDE ER 10 MG PO TB24 Oral Take 1 tablet (10 mg total) by mouth daily. 90 tablet 3  . SERTRALINE HCL 50 MG PO TABS Oral Take 1 tablet (50 mg total) by mouth daily. 30 tablet 2  . TRANDOLAPRIL 4 MG PO TABS Oral Take 1 tablet (4 mg total) by mouth daily. 90 tablet 3    BP 182/57  Pulse 53  Temp(Src) 97.8 F (36.6 C) (Oral)  Resp 20  SpO2 100%  Physical Exam  Nursing note and vitals reviewed. Constitutional: She is oriented to person, place, and time. She appears well-developed and well-nourished. No distress.  HENT:  Head: Normocephalic and atraumatic.  Nose: Nose normal.  Eyes: Conjunctivae and EOM are normal. Pupils are equal, round, and reactive to light.  Fundoscopic exam:      The right eye shows no hemorrhage.       The left eye shows no hemorrhage.  Neck: Normal range of motion. Neck supple.  Cardiovascular: Normal rate, regular rhythm, normal heart sounds and intact distal pulses.   Pulmonary/Chest: Effort normal and breath sounds normal.  Abdominal: Bowel sounds are normal. She exhibits no distension.  Musculoskeletal: Normal range of motion. She exhibits no edema and no tenderness.  Neurological: She is alert and oriented to person, place, and time. No cranial nerve deficit. She exhibits normal muscle tone. Coordination normal.  Skin: Skin is warm and dry.  Psychiatric: She has a normal mood and affect. Her behavior is normal. Judgment and thought content normal.    ED Course  Procedures (including critical care time)  Labs Reviewed - No data to display No results found.   No diagnosis found.    MDM   Per chart review - pt contacted PMD yesterday-  "Pt was at doctors office this AM with pts husband and had her BP taken This  AM BP was 127/44. Pt heart raced yesterday but not today. Pt has no chest pain, sob, h/a,or dizziness. Pt took BP while on phone and BP now is 176/93 with pulse rate of 62. Pt is taking Amlodipine 5 mg one daily, Mavik 4 mg one daily and Hydralazine 50 mg one twice a day (Dr Holley Raring nephrologist  decreased pt from taking one three times a day). Pt has not missed any doses of med. Pt uses [facility].Please advise. Pt can be reached at [phone]."  Pt was advised to rest and if no improvement to f/u on Sat clinic or here.    Pt asxatic here. istat WNL.  d/w Dr. Sarajane Jews on call for Dr. Glori Bickers, will restart hydralazine and have her f/u on Monday. D/w to return to ED    Cherly Beach, MD 07/22/11 [phone]

## 2011-07-23 ENCOUNTER — Encounter (INDEPENDENT_AMBULATORY_CARE_PROVIDER_SITE_OTHER): Payer: Medicare Other | Admitting: Ophthalmology

## 2011-07-23 ENCOUNTER — Telehealth: Payer: Self-pay

## 2011-07-23 ENCOUNTER — Telehealth: Payer: Self-pay | Admitting: Internal Medicine

## 2011-07-23 DIAGNOSIS — E11319 Type 2 diabetes mellitus with unspecified diabetic retinopathy without macular edema: Secondary | ICD-10-CM

## 2011-07-23 DIAGNOSIS — H35379 Puckering of macula, unspecified eye: Secondary | ICD-10-CM

## 2011-07-23 DIAGNOSIS — H33009 Unspecified retinal detachment with retinal break, unspecified eye: Secondary | ICD-10-CM

## 2011-07-23 DIAGNOSIS — E1139 Type 2 diabetes mellitus with other diabetic ophthalmic complication: Secondary | ICD-10-CM

## 2011-07-23 DIAGNOSIS — E1165 Type 2 diabetes mellitus with hyperglycemia: Secondary | ICD-10-CM

## 2011-07-23 NOTE — Telephone Encounter (Signed)
[facility] Triage Call Report Triage Record Num: [id number] Operator: [place] Patient Name: Renee [name] Call Date & Time: 07/21/2011 10:01:47AM Patient Phone: [phone] PCP: Wynelle Fanny. [place] Patient Gender: Female PCP Fax : Patient DOB: 05-27-1929 Practice Name: [facility] Reason for Call: CALL ON EMERGENT LINE. Caller: [name]/Patient; PCP: [name] A.; CB#: [phone]; Call Reason: High Blood Pressure; Sx Onset: 07/21/2011; Sx Notes: PT's BP on 12/01 of 196/93 at 0930, 173/107 at 0900 ; Afebrile; Wt: ; Guideline Used: Hypertension, Diagnosed ; Disp: see within 4h due to "systolic BP of more than A999333;" Appt Scheduled?: N. No appts available per office. Advised UC or ED. Pt verbalized "she is on the way to UC." Protocol(s) Used: Hypertension, Diagnosed or Suspected Recommended Outcome per Protocol: See Provider within 4 hours Reason for Outcome: Systolic blood pressure of more than 99991111 mmHg OR diastolic blood pressure of more than 120 mmHg Care Advice: ~ Another adult should drive. ~ Call provider if symptoms worsen or new symptoms develop. ~ List, or take, all current prescription(s), nonprescription or alternative medication(s) to provider for evaluation. 07/21/2011 10:10:29AM Page 1 of 1 [email or web address]

## 2011-07-23 NOTE — Telephone Encounter (Signed)
Appointment made for Friday 07/27/11 @ 3:30

## 2011-07-23 NOTE — Telephone Encounter (Signed)
Pt did go to ER for this and records were reviewed

## 2011-07-23 NOTE — H&P (Signed)
Renee [name] is an 77 y.o. female.   Chief Complaint: Reduced vision since Retinal Detachment and Silicone Oil surgery OD HPI: Had Pars Plana Vitrectomy with silicone oil injection 02-13-11  Past Medical History  Diagnosis Date  . Sleep apnea   . Diabetes mellitus     type II  . Hypertension   . Hyperlipidemia   . Stroke     Small vessel sobcortical (in [place] trial) with Dr Renee [name]  . Osteoarthritis   . Angioedema     possibly from voltaren  . Vitamin B 12 deficiency 04/08  . LVH (left ventricular hypertrophy)     and atrial enlargement by echo in past with nl EF  . Degenerative disc disease   . Osteopenia   . Nasal pruritis   . Renal insufficiency     Past Surgical History  Procedure Date  . Eye surgery     cataract extraction  . Abdominal hysterectomy     BSO-fibroids  . Knee surgery     arthroscope  . Spine surgery 08/09    spinal decompression surgery  . Retinal detachment surgery 02/18/11    times 2    Family History  Problem Relation Age of Onset  . Cancer Sister     brain tumor with hemmorhage  . Heart disease Sister     CAD  . COPD Brother    Social History:  reports that she has never smoked. She does not have any smokeless tobacco history on file. She reports that she does not drink alcohol. Her drug history not on file.  Allergies:  Allergies  Allergen Reactions  . Diclofenac Sodium     REACTION: angioedema  . Pioglitazone     REACTION: swelling    No current facility-administered medications on file as of .   Medications Prior to Admission  Medication Sig Dispense Refill  . amLODipine (NORVASC) 5 MG tablet Take 1 tablet (5 mg total) by mouth daily. Kidney doctor Dr Renee [name] started 04/03/11.  90 tablet  3  . aspirin 325 MG tablet Take 325 mg by mouth daily.        . calcium-vitamin D (OSCAL WITH D) 500-200 MG-UNIT per tablet Take 1 tablet by mouth daily.        . Cholecalciferol (VITAMIN D) 1000 UNITS capsule Take 1,000 Units by mouth daily.         . cyanocobalamin (,VITAMIN B-12,) 1000 MCG/ML injection Inject 1,000 mcg into the muscle every 8 (eight) weeks.        Marland Kitchen gemfibrozil (LOPID) 600 MG tablet Take 1 tablet (600 mg total) by mouth daily.  90 tablet  3  . hydrALAZINE (APRESOLINE) 50 MG tablet Take 1 tablet (50 mg total) by mouth 3 (three) times daily.  270 tablet  3  . insulin glargine (LANTUS) 100 UNIT/ML injection Inject 10 Units into the skin daily. Or as directed.  10 mL  3  . metoprolol succinate (TOPROL-XL) 25 MG 24 hr tablet 1/2 tablet by mouth twice a day  90 tablet  3  . mirtazapine (REMERON) 15 MG tablet 1/2 pill by mouth each bedtime   45 tablet  3  . Multiple Vitamin (MULTIVITAMIN) capsule Take 1 capsule by mouth daily.        Marland Kitchen oxybutynin (DITROPAN-XL) 10 MG 24 hr tablet Take 1 tablet (10 mg total) by mouth daily.  90 tablet  3  . sertraline (ZOLOFT) 50 MG tablet Take 1 tablet (50 mg total) by mouth daily.  30 tablet  2  . trandolapril (MAVIK) 4 MG tablet Take 1 tablet (4 mg total) by mouth daily.  90 tablet  3    Review of systems otherwise negative  There were no vitals taken for this visit.  Physical exam: Mental status: oriented x3. Eyes: See eye exam scanned into media center for this encounter Ears, Nose, Throat: within normal limits Neck: Within Normal limits General: within normal limits Chest: Within normal limits Breast: deferred Heart: Within normal limits Abdomen:  TAH scar   Back Scar GU: deferred Extremities: knee scar Skin: within normal limits  Assessment/Plan Silicone oil in Vitreous, Pre retinal fibrosis, retinal attachment  Plan: To [facility] for Pars Plana Vitrectomy and removal of silicone oil OD  Renee [name] 07/23/2011, 12:56 PM

## 2011-07-24 ENCOUNTER — Encounter (HOSPITAL_COMMUNITY): Payer: Self-pay | Admitting: Pharmacy Technician

## 2011-07-27 ENCOUNTER — Telehealth: Payer: Self-pay

## 2011-07-27 ENCOUNTER — Ambulatory Visit (INDEPENDENT_AMBULATORY_CARE_PROVIDER_SITE_OTHER): Payer: Medicare Other | Admitting: Family Medicine

## 2011-07-27 ENCOUNTER — Encounter: Payer: Self-pay | Admitting: Family Medicine

## 2011-07-27 VITALS — BP 140/72 | HR 56 | Temp 98.9°F | Ht 63.0 in | Wt 179.8 lb

## 2011-07-27 DIAGNOSIS — I10 Essential (primary) hypertension: Secondary | ICD-10-CM

## 2011-07-27 DIAGNOSIS — F341 Dysthymic disorder: Secondary | ICD-10-CM

## 2011-07-27 DIAGNOSIS — H3320 Serous retinal detachment, unspecified eye: Secondary | ICD-10-CM

## 2011-07-27 DIAGNOSIS — F418 Other specified anxiety disorders: Secondary | ICD-10-CM

## 2011-07-27 DIAGNOSIS — Z01818 Encounter for other preprocedural examination: Secondary | ICD-10-CM

## 2011-07-27 DIAGNOSIS — I69959 Hemiplegia and hemiparesis following unspecified cerebrovascular disease affecting unspecified side: Secondary | ICD-10-CM

## 2011-07-27 DIAGNOSIS — E119 Type 2 diabetes mellitus without complications: Secondary | ICD-10-CM

## 2011-07-27 DIAGNOSIS — N259 Disorder resulting from impaired renal tubular function, unspecified: Secondary | ICD-10-CM

## 2011-07-27 MED ORDER — ALPRAZOLAM 0.5 MG PO TABS: 0.5000 mg | ORAL_TABLET | Freq: Every evening | ORAL | Status: DC | PRN

## 2011-07-27 NOTE — Patient Instructions (Addendum)
We faxed note and EKG to Dr Zigmund Daniel for surgical clearance indicating things to watch (blood pressure/ sugar)- and asking him to please re start your aspirin as soon as it is safe  For excessive anxiety - you can try some xanax at bedtime with caution (making sure someone is there with you the next morning ) as it can make you sleepy/ unsteady Continue your other medicines for depression /anxiety zoloft/ remeron -- and follow up with me about 2 weeks after your surgery to discuss it further

## 2011-07-27 NOTE — Progress Notes (Signed)
Subjective:    Patient ID: Renee [name], female    DOB: 11-01-1928, 77 y.o.   MRN: [id number]  HPI Here for surgical clearance for eye surgery with hx of labile bp and cva   General anethesia for eye surgery Dr Zigmund Daniel on Tuesday  Will be doing a virectomy Vision is still poor in that eye - after retinal detatchment  Last time she had general aneth- had burning in her hand from the IV and was upset about that  No other problems  No nausea or trouble with breathing   Blood sugar control has been great   Her renal  inc her amlodipine to 10 mg - bp is good    She has already stopped her asa since Thursday     Had small vessel subcortical cva in the past  Renal insuff last cr 1.4  bp has been up and down 140/72 today  Thinks it goes up when very anxious  bmi of 32 A friend from church ? Who is a nurse sent a note with her today indicating her anxiety is much worse ? Perhaps due to the upcoming surgery and vision problems  ekg today sinus brady at rate of 54 with no acute changes   Patient Active Problem List  Diagnoses  . POSTHERPETIC NEURALGIA  . DIABETES MELLITUS, TYPE II  . B12 DEFICIENCY  . HYPERLIPIDEMIA  . HYPERTENSION  . CVA WITH LEFT HEMIPARESIS  . RENAL INSUFFICIENCY  . FIBROCYSTIC BREAST DISEASE  . ROSACEA  . OSTEOARTHRITIS  . BACK PAIN  . MUSCLE WEAKNESS (GENERALIZED)  . OSTEOPENIA  . EDEMA  . URINARY INCONTINENCE, MIXED  . ANGIOEDEMA  . DEPRESSION  . Sleep apnea  . Retinal detachment  . Mobility impaired   Past Medical History  Diagnosis Date  . Sleep apnea   . Diabetes mellitus     type II  . Hypertension   . Hyperlipidemia   . Stroke     Small vessel sobcortical (in [place] trial) with Dr Leonie Man  . Osteoarthritis   . Angioedema     possibly from voltaren  . Vitamin B 12 deficiency 04/08  . LVH (left ventricular hypertrophy)     and atrial enlargement by echo in past with nl EF  . Degenerative disc disease   . Osteopenia   . Nasal  pruritis   . Renal insufficiency    Past Surgical History  Procedure Date  . Eye surgery     cataract extraction  . Abdominal hysterectomy     BSO-fibroids  . Knee surgery     arthroscope  . Spine surgery 08/09    spinal decompression surgery  . Retinal detachment surgery 02/18/11    times 2   History  Substance Use Topics  . Smoking status: Never Smoker   . Smokeless tobacco: Not on file  . Alcohol Use: No   Family History  Problem Relation Age of Onset  . Cancer Sister     brain tumor with hemmorhage  . Heart disease Sister     CAD  . COPD Brother    Allergies  Allergen Reactions  . Diclofenac Sodium     REACTION: angioedema  . Pioglitazone     REACTION: swelling   Current Outpatient Prescriptions on File Prior to Visit  Medication Sig Dispense Refill  . amLODipine (NORVASC) 5 MG tablet Take 10 mg by mouth daily. Kidney doctor Dr Holley Raring started 04/03/11.      . calcium-vitamin D (OSCAL WITH D)  500-200 MG-UNIT per tablet Take 1 tablet by mouth daily.       . Cholecalciferol (VITAMIN D) 1000 UNITS capsule Take 1,000 Units by mouth daily.       . cyanocobalamin (,VITAMIN B-12,) 1000 MCG/ML injection Inject 1,000 mcg into the muscle every 8 (eight) weeks.       Marland Kitchen gemfibrozil (LOPID) 600 MG tablet Take 600 mg by mouth daily.        . hydrALAZINE (APRESOLINE) 50 MG tablet Take 50 mg by mouth 3 (three) times daily.        . insulin glargine (LANTUS) 100 UNIT/ML injection Inject 10 Units into the skin at bedtime. Or as directed.       . metoprolol succinate (TOPROL-XL) 25 MG 24 hr tablet 1/2 tablet by mouth twice a day      . mirtazapine (REMERON) 15 MG tablet Take 7.5 mg by mouth at bedtime. 1/2 pill by mouth each bedtime        . Multiple Vitamin (MULTIVITAMIN) capsule Take 1 capsule by mouth daily.       Marland Kitchen oxybutynin (DITROPAN-XL) 10 MG 24 hr tablet Take 1 tablet (10 mg total) by mouth daily.  90 tablet  3  . sertraline (ZOLOFT) 50 MG tablet Take 50 mg by mouth daily.         . trandolapril (MAVIK) 4 MG tablet Take 4 mg by mouth daily.        Marland Kitchen aspirin 325 MG tablet Take 325 mg by mouth daily.           Review of Systems Review of Systems  Constitutional: Negative for fever, appetite change, fatigue and unexpected weight change.  Eyes: Negative for pain and visual disturbance.  Respiratory: Negative for cough and shortness of breath.   Cardiovascular: Negative for cp or palpitations    Gastrointestinal: Negative for nausea, diarrhea and constipation.  Genitourinary: Negative for urgency and frequency.  Skin: Negative for pallor or rash   Neurological: Negative for weakness, light-headedness, numbness and headaches.  Hematological: Negative for adenopathy. Does not bruise/bleed easily.  Psychiatric/Behavioral: Negative for dysphoric mood. The patient is more anxious than usual despite her meds-- needs some adj or something to take before surgery         Objective:   Physical Exam  Constitutional: She is oriented to person, place, and time. She appears well-developed and well-nourished. No distress.  HENT:  Head: Normocephalic and atraumatic.  Right Ear: External ear normal.  Left Ear: External ear normal.  Nose: Nose normal.  Mouth/Throat: Oropharynx is clear and moist.       Baseline facial droop  Eyes: Conjunctivae and EOM are normal. Pupils are equal, round, and reactive to light. Right eye exhibits no discharge. Left eye exhibits no discharge. No scleral icterus.       Poor vision from her affected eye   Neck: Normal range of motion. Neck supple. No JVD present. Carotid bruit is not present. No thyromegaly present.  Cardiovascular: Normal rate, regular rhythm, normal heart sounds and intact distal pulses.  Exam reveals no gallop.   Pulmonary/Chest: Effort normal and breath sounds normal. No respiratory distress. She has no wheezes.  Abdominal: Soft. Bowel sounds are normal. She exhibits no distension and no mass. There is no tenderness.    Musculoskeletal: Normal range of motion. She exhibits no edema and no tenderness.  Lymphadenopathy:    She has no cervical adenopathy.  Neurological: She is alert and oriented to person, place, and time. She  exhibits normal muscle tone.       No change in L hemiparesis  Skin: Skin is warm and dry. No rash noted. No erythema. No pallor.  Psychiatric:       Is more anxious than usual today- almost tearful  Voices her concern about the upcoming surgery          Assessment & Plan:

## 2011-07-27 NOTE — Telephone Encounter (Deleted)
Pt seen recently and had odd feeling in lt side almost like a pressure feeling; a complete abdominal US already scheduled for MOn 07/30/11. Pt said today developed tenderness in rt side under rib cage. Seems worse after eating. Advised not to eat greasy, spicy food and pt wondered about carbonated drinks, advised water or decaff tea. Pt said no fever, no N&V, no diarrhea and no constipation. Pain level now is 5. Pt is at work and cannot get cell reception. Call work [phone]. Pt wonders if she should wait til mon for [place] or come see a dr , go to [facility] or what. Please advise..( gave pt [facility] Sat clinic # if needed).

## 2011-07-27 NOTE — Telephone Encounter (Signed)
This info was entered on wrong chart. But was caught before spoke with Dr Glori Bickers and cancelled.

## 2011-07-29 NOTE — Assessment & Plan Note (Signed)
Stable symptoms of L hemiparesis at this time  Is off asa for 5 days for surgery- warned to watch closely for s/s of cva - disc this with pt and home nurse today Will get back on it as soon as safe from her surgery

## 2011-07-29 NOTE — Assessment & Plan Note (Signed)
Very well controlled Lab Results  Component Value Date   HGBA1C 5.9 03/28/2011   disc imp of watching sugars throughout hosp for eye surgery

## 2011-07-29 NOTE — Assessment & Plan Note (Signed)
Much worse anxiety lately-may be from eye problems and upcoming surgery  I do not want to make any big medicine changes at this time with surgery several days away (is on remeron and zoloft) Gave her px for xanax .5 to use with caution over the weekend Hopefully anxiety will improve after her surgery  F/u made to disc in more detail and see if changes need to be made

## 2011-07-29 NOTE — Assessment & Plan Note (Signed)
Very labile along with her anxiety - with recent increase in amlodipine from renal doctor that seems to be well tolerated  Urged to watch bp carefully during the surgical stay

## 2011-07-29 NOTE — Assessment & Plan Note (Signed)
For upcoming surgery with general aneth Off asa for 5 d- doing ok  Is medically cleared with notes to watch bp / sugar and renal fxn closely  Pt will disc IV issues with the anesthesiologist  Pt is very anxious to get this over with

## 2011-07-29 NOTE — Assessment & Plan Note (Signed)
Followed closely by renal Urged surgeon to watch cr during surgical stay- keep well hydrated bp is more stable now

## 2011-07-30 ENCOUNTER — Encounter (HOSPITAL_COMMUNITY): Payer: Self-pay

## 2011-07-30 MED ORDER — CYCLOPENTOLATE HCL 1 % OP SOLN: 1.0000 [drp] | OPHTHALMIC | Status: AC

## 2011-07-30 MED ORDER — GATIFLOXACIN 0.5 % OP SOLN
1.0000 [drp] | OPHTHALMIC | Status: DC
  Filled 2011-07-30: qty 2.5

## 2011-07-30 MED ORDER — TROPICAMIDE 1 % OP SOLN: 1.0000 [drp] | OPHTHALMIC | Status: AC

## 2011-07-30 MED ORDER — PHENYLEPHRINE HCL 2.5 % OP SOLN: 1.0000 [drp] | OPHTHALMIC | Status: AC

## 2011-07-31 ENCOUNTER — Encounter (HOSPITAL_COMMUNITY): Payer: Self-pay | Admitting: *Deleted

## 2011-07-31 ENCOUNTER — Encounter (HOSPITAL_COMMUNITY): Payer: Self-pay | Admitting: Anesthesiology

## 2011-07-31 ENCOUNTER — Encounter (HOSPITAL_COMMUNITY)
Admission: RE | Disposition: A | Discharge status: Home / Self Care | Payer: Self-pay | Source: Ambulatory Visit | Attending: Ophthalmology

## 2011-07-31 ENCOUNTER — Ambulatory Visit (HOSPITAL_COMMUNITY): Payer: Medicare Other | Admitting: Anesthesiology

## 2011-07-31 ENCOUNTER — Ambulatory Visit (HOSPITAL_COMMUNITY)
Admission: RE | Admit: 2011-07-31 | Discharge: 2011-08-01 | Disposition: A | Discharge status: Home / Self Care | Payer: Medicare Other | Source: Ambulatory Visit | Attending: Ophthalmology | Admitting: Ophthalmology

## 2011-07-31 DIAGNOSIS — G4730 Sleep apnea, unspecified: Secondary | ICD-10-CM | POA: Insufficient documentation

## 2011-07-31 DIAGNOSIS — Y998 Other external cause status: Secondary | ICD-10-CM | POA: Insufficient documentation

## 2011-07-31 DIAGNOSIS — Z8669 Personal history of other diseases of the nervous system and sense organs: Secondary | ICD-10-CM | POA: Diagnosis present

## 2011-07-31 DIAGNOSIS — T1590XA Foreign body on external eye, part unspecified, unspecified eye, initial encounter: Secondary | ICD-10-CM | POA: Insufficient documentation

## 2011-07-31 DIAGNOSIS — H33009 Unspecified retinal detachment with retinal break, unspecified eye: Secondary | ICD-10-CM

## 2011-07-31 DIAGNOSIS — S0550XA Penetrating wound with foreign body of unspecified eyeball, initial encounter: Secondary | ICD-10-CM | POA: Insufficient documentation

## 2011-07-31 DIAGNOSIS — H3320 Serous retinal detachment, unspecified eye: Secondary | ICD-10-CM

## 2011-07-31 DIAGNOSIS — N289 Disorder of kidney and ureter, unspecified: Secondary | ICD-10-CM | POA: Insufficient documentation

## 2011-07-31 DIAGNOSIS — I10 Essential (primary) hypertension: Secondary | ICD-10-CM | POA: Insufficient documentation

## 2011-07-31 DIAGNOSIS — Y92009 Unspecified place in unspecified non-institutional (private) residence as the place of occurrence of the external cause: Secondary | ICD-10-CM | POA: Insufficient documentation

## 2011-07-31 DIAGNOSIS — E119 Type 2 diabetes mellitus without complications: Secondary | ICD-10-CM | POA: Insufficient documentation

## 2011-07-31 DIAGNOSIS — Z1889 Other specified retained foreign body fragments: Secondary | ICD-10-CM | POA: Insufficient documentation

## 2011-07-31 HISTORY — PX: PARS PLANA VITRECTOMY: SHX2166

## 2011-07-31 LAB — SURGICAL PCR SCREEN
MRSA, PCR: NEGATIVE
Staphylococcus aureus: NEGATIVE

## 2011-07-31 LAB — BASIC METABOLIC PANEL
BUN: 22 mg/dL (ref 6–23)
CO2: 29 mEq/L (ref 19–32)
Calcium: 8.9 mg/dL (ref 8.4–10.5)
Chloride: 107 mEq/L (ref 96–112)
Creatinine, Ser: 1.43 mg/dL — ABNORMAL HIGH (ref 0.50–1.10)
GFR calc Af Amer: 39 mL/min — ABNORMAL LOW (ref >90)
GFR calc non Af Amer: 34 mL/min — ABNORMAL LOW (ref >90)
Glucose, Bld: 79 mg/dL (ref 70–99)
Potassium: 4 mEq/L (ref 3.5–5.1)
Sodium: 142 mEq/L (ref 135–145)

## 2011-07-31 LAB — CBC
HCT: 30.5 % — ABNORMAL LOW (ref 36.0–46.0)
Hemoglobin: 10 g/dL — ABNORMAL LOW (ref 12.0–15.0)
MCH: 27.5 pg (ref 26.0–34.0)
MCHC: 32.8 g/dL (ref 30.0–36.0)
MCV: 84 fL (ref 78.0–100.0)
Platelets: 157 10*3/uL (ref 150–400)
RBC: 3.63 MIL/uL — ABNORMAL LOW (ref 3.87–5.11)
RDW: 14 % (ref 11.5–15.5)
WBC: 4.7 10*3/uL (ref 4.0–10.5)

## 2011-07-31 LAB — GLUCOSE, CAPILLARY
Glucose-Capillary: 76 mg/dL (ref 70–99)
Glucose-Capillary: 72 mg/dL (ref 70–99)
Glucose-Capillary: 90 mg/dL (ref 70–99)
Glucose-Capillary: 102 mg/dL — ABNORMAL HIGH (ref 70–99)
Glucose-Capillary: 247 mg/dL — ABNORMAL HIGH (ref 70–99)

## 2011-07-31 SURGERY — PARS PLANA VITRECTOMY WITH 25 GAUGE
Anesthesia: General | Site: Eye | Laterality: Right | Wound class: Clean

## 2011-07-31 MED ORDER — CEFAZOLIN SODIUM 1-5 GM-% IV SOLN
INTRAVENOUS | Status: DC | PRN
  Administered 2011-07-31: 1 g via INTRAVENOUS

## 2011-07-31 MED ORDER — BRIMONIDINE TARTRATE 0.2 % OP SOLN
1.0000 [drp] | Freq: Two times a day (BID) | OPHTHALMIC | Status: DC
  Filled 2011-07-31: qty 5

## 2011-07-31 MED ORDER — SODIUM CHLORIDE 0.9 % IR SOLN
Status: DC | PRN
  Administered 2011-07-31: 1

## 2011-07-31 MED ORDER — ATROPINE SULFATE 1 % OP SOLN
OPHTHALMIC | Status: DC | PRN
  Administered 2011-07-31: 2 [drp] via OPHTHALMIC

## 2011-07-31 MED ORDER — EPHEDRINE SULFATE 50 MG/ML IJ SOLN
INTRAMUSCULAR | Status: DC | PRN
  Administered 2011-07-31 (×2): 10 mg via INTRAVENOUS

## 2011-07-31 MED ORDER — PREDNISOLONE ACETATE 1 % OP SUSP
1.0000 [drp] | Freq: Four times a day (QID) | OPHTHALMIC | Status: DC
  Administered 2011-07-31 – 2011-08-01 (×2): 1 [drp] via OPHTHALMIC
  Filled 2011-07-31: qty 1

## 2011-07-31 MED ORDER — TETRACAINE HCL 0.5 % OP SOLN
1.0000 [drp] | OPHTHALMIC | Status: DC | PRN
  Filled 2011-07-31: qty 2

## 2011-07-31 MED ORDER — PHENYLEPHRINE HCL 2.5 % OP SOLN
1.0000 [drp] | OPHTHALMIC | Status: AC
  Administered 2011-07-31: 1 [drp] via OPHTHALMIC

## 2011-07-31 MED ORDER — GLYCOPYRROLATE 0.2 MG/ML IJ SOLN
INTRAMUSCULAR | Status: DC | PRN
  Administered 2011-07-31: .6 mg via INTRAVENOUS

## 2011-07-31 MED ORDER — POLYMYXIN B SULFATE 500000 UNITS IJ SOLR
INTRAMUSCULAR | Status: DC | PRN
  Administered 2011-07-31: 500000 [IU]

## 2011-07-31 MED ORDER — ROCURONIUM BROMIDE 100 MG/10ML IV SOLN
INTRAVENOUS | Status: DC | PRN
  Administered 2011-07-31: 10 mg via INTRAVENOUS
  Administered 2011-07-31: 40 mg via INTRAVENOUS

## 2011-07-31 MED ORDER — GATIFLOXACIN 0.5 % OP SOLN
1.0000 [drp] | Freq: Four times a day (QID) | OPHTHALMIC | Status: DC
  Administered 2011-08-01: 1 [drp] via OPHTHALMIC
  Filled 2011-07-31 (×2): qty 2.5

## 2011-07-31 MED ORDER — SODIUM CHLORIDE 0.9 % IJ SOLN
INTRAMUSCULAR | Status: DC | PRN
  Administered 2011-07-31: 10 mL

## 2011-07-31 MED ORDER — ACETAZOLAMIDE SODIUM 500 MG IJ SOLR
500.0000 mg | Freq: Once | INTRAMUSCULAR | Status: AC
  Administered 2011-08-01: 500 mg via INTRAVENOUS
  Filled 2011-07-31: qty 500

## 2011-07-31 MED ORDER — LATANOPROST 0.005 % OP SOLN
1.0000 [drp] | Freq: Every day | OPHTHALMIC | Status: DC
  Filled 2011-07-31: qty 2.5

## 2011-07-31 MED ORDER — MAGNESIUM HYDROXIDE 400 MG/5ML PO SUSP: 15.0000 mL | Freq: Four times a day (QID) | ORAL | Status: DC | PRN

## 2011-07-31 MED ORDER — PHENYLEPHRINE HCL 10 % OP SOLN: 1.0000 [drp] | OPHTHALMIC | Status: DC

## 2011-07-31 MED ORDER — SODIUM CHLORIDE 0.9 % IV SOLN
INTRAVENOUS | Status: DC | PRN
  Administered 2011-07-31: 13:00:00 via INTRAVENOUS

## 2011-07-31 MED ORDER — BACITRACIN-POLYMYXIN B 500-10000 UNIT/GM OP OINT
TOPICAL_OINTMENT | OPHTHALMIC | Status: DC | PRN
  Administered 2011-07-31: 1 via OPHTHALMIC

## 2011-07-31 MED ORDER — FENTANYL CITRATE 0.05 MG/ML IJ SOLN
INTRAMUSCULAR | Status: DC | PRN
  Administered 2011-07-31: 100 ug via INTRAVENOUS
  Administered 2011-07-31: 50 ug via INTRAVENOUS

## 2011-07-31 MED ORDER — TEMAZEPAM 15 MG PO CAPS: 15.0000 mg | ORAL_CAPSULE | Freq: Every evening | ORAL | Status: DC | PRN

## 2011-07-31 MED ORDER — BSS PLUS IO SOLN
INTRAOCULAR | Status: DC | PRN
  Administered 2011-07-31: 1 via INTRAOCULAR

## 2011-07-31 MED ORDER — ONDANSETRON HCL 4 MG/2ML IJ SOLN: 4.0000 mg | Freq: Four times a day (QID) | INTRAMUSCULAR | Status: DC | PRN

## 2011-07-31 MED ORDER — GATIFLOXACIN 0.5 % OP SOLN
1.0000 [drp] | OPHTHALMIC | Status: AC
  Administered 2011-07-31: 1 [drp] via OPHTHALMIC

## 2011-07-31 MED ORDER — BUPIVACAINE HCL 0.75 % IJ SOLN
INTRAMUSCULAR | Status: DC | PRN
  Administered 2011-07-31: 10 mL

## 2011-07-31 MED ORDER — EPINEPHRINE HCL 1 MG/ML IJ SOLN
INTRAMUSCULAR | Status: DC | PRN
  Administered 2011-07-31: .3 mL

## 2011-07-31 MED ORDER — SODIUM HYALURONATE 10 MG/ML IO SOLN
INTRAOCULAR | Status: DC | PRN
  Administered 2011-07-31: 0.85 mL via INTRAOCULAR

## 2011-07-31 MED ORDER — MUPIROCIN 2 % EX OINT
TOPICAL_OINTMENT | CUTANEOUS | Status: AC
  Administered 2011-07-31: 1 via NASAL
  Filled 2011-07-31: qty 22

## 2011-07-31 MED ORDER — TROPICAMIDE 1 % OP SOLN
1.0000 [drp] | OPHTHALMIC | Status: AC
  Administered 2011-07-31: 1 [drp] via OPHTHALMIC

## 2011-07-31 MED ORDER — BSS IO SOLN
INTRAOCULAR | Status: DC | PRN
  Administered 2011-07-31: 15 mL via INTRAOCULAR

## 2011-07-31 MED ORDER — TROPICAMIDE 1 % OP SOLN
1.0000 [drp] | Freq: Once | OPHTHALMIC | Status: DC
  Filled 2011-07-31: qty 3

## 2011-07-31 MED ORDER — PHENYLEPHRINE HCL 2.5 % OP SOLN
OPHTHALMIC | Status: AC
  Filled 2011-07-31: qty 3

## 2011-07-31 MED ORDER — DEXAMETHASONE SODIUM PHOSPHATE 10 MG/ML IJ SOLN
INTRAMUSCULAR | Status: DC | PRN
  Administered 2011-07-31: 10 mg

## 2011-07-31 MED ORDER — GENTAMICIN SULFATE 40 MG/ML IJ SOLN
INTRAMUSCULAR | Status: DC | PRN
  Administered 2011-07-31: 80 mg

## 2011-07-31 MED ORDER — PROPOFOL 10 MG/ML IV EMUL
INTRAVENOUS | Status: DC | PRN
  Administered 2011-07-31: 100 mg via INTRAVENOUS

## 2011-07-31 MED ORDER — CEFAZOLIN SODIUM 1-5 GM-% IV SOLN
INTRAVENOUS | Status: AC
  Filled 2011-07-31: qty 50

## 2011-07-31 MED ORDER — HEMOSTATIC AGENTS (NO CHARGE) OPTIME
TOPICAL | Status: DC | PRN
  Administered 2011-07-31: 1 via TOPICAL

## 2011-07-31 MED ORDER — OXYCODONE-ACETAMINOPHEN 5-325 MG PO TABS: 1.0000 | ORAL_TABLET | ORAL | Status: DC | PRN

## 2011-07-31 MED ORDER — ACETAMINOPHEN 325 MG PO TABS
325.0000 mg | ORAL_TABLET | ORAL | Status: DC | PRN
  Administered 2011-07-31: 650 mg via ORAL
  Filled 2011-07-31 (×2): qty 2

## 2011-07-31 MED ORDER — SODIUM CHLORIDE 0.9 % IV SOLN
INTRAVENOUS | Status: DC
  Administered 2011-07-31: 12:00:00 via INTRAVENOUS

## 2011-07-31 MED ORDER — CYCLOPENTOLATE HCL 1 % OP SOLN
1.0000 [drp] | OPHTHALMIC | Status: AC
  Administered 2011-07-31: 1 [drp] via OPHTHALMIC

## 2011-07-31 MED ORDER — MIDAZOLAM HCL 5 MG/5ML IJ SOLN
INTRAMUSCULAR | Status: DC | PRN
  Administered 2011-07-31: 1 mg via INTRAVENOUS

## 2011-07-31 MED ORDER — CYCLOPENTOLATE HCL 1 % OP SOLN
1.0000 [drp] | Freq: Once | OPHTHALMIC | Status: DC
  Filled 2011-07-31: qty 2

## 2011-07-31 MED ORDER — ONDANSETRON HCL 4 MG/2ML IJ SOLN
INTRAMUSCULAR | Status: DC | PRN
  Administered 2011-07-31: 4 mg via INTRAVENOUS

## 2011-07-31 MED ORDER — NEOSTIGMINE METHYLSULFATE 1 MG/ML IJ SOLN
INTRAMUSCULAR | Status: DC | PRN
  Administered 2011-07-31: 4 mg via INTRAVENOUS

## 2011-07-31 MED ORDER — SODIUM CHLORIDE 0.45 % IV SOLN
INTRAVENOUS | Status: DC
  Administered 2011-07-31: 18:00:00 via INTRAVENOUS

## 2011-07-31 MED ORDER — MORPHINE SULFATE 2 MG/ML IJ SOLN: 1.0000 mg | INTRAMUSCULAR | Status: DC | PRN

## 2011-07-31 MED ORDER — MUPIROCIN 2 % EX OINT
TOPICAL_OINTMENT | Freq: Two times a day (BID) | CUTANEOUS | Status: DC
  Administered 2011-07-31: 1 via NASAL
  Administered 2011-07-31: 22:00:00 via NASAL
  Filled 2011-07-31: qty 22

## 2011-07-31 MED ORDER — PHENYLEPHRINE HCL 2.5 % OP SOLN: 1.0000 [drp] | Freq: Once | OPHTHALMIC | Status: DC

## 2011-07-31 SURGICAL SUPPLY — 63 items
APPLICATOR DR MATTHEWS STRL (MISCELLANEOUS) IMPLANT
BLADE EYE CATARACT 19 1.4 BEAV (BLADE) IMPLANT
BLADE MVR KNIFE 19G (BLADE) ×2 IMPLANT
BLADE MVR KNIFE 20G (BLADE) IMPLANT
CANNULA DUAL BORE 23G (CANNULA) IMPLANT
CANNULA FLEX TIP 25G (CANNULA) IMPLANT
CANNULA IRRIGATING 27GX7/8 (BLADE) IMPLANT
CLOTH BEACON ORANGE TIMEOUT ST (SAFETY) ×2 IMPLANT
CORDS BIPOLAR (ELECTRODE) ×2 IMPLANT
COTTONBALL LRG STERILE PKG (GAUZE/BANDAGES/DRESSINGS) ×6 IMPLANT
DRAPE OPHTHALMIC 77X100 STRL (CUSTOM PROCEDURE TRAY) ×2 IMPLANT
FILTER BLUE MILLIPORE (MISCELLANEOUS) IMPLANT
FILTER STRAW FLUID ASPIR (MISCELLANEOUS) IMPLANT
FORCEPS ECKARDT ILM 25G SERR (OPHTHALMIC RELATED) IMPLANT
GLOVE BIOGEL PI IND STRL 6.5 (GLOVE) ×1 IMPLANT
GLOVE BIOGEL PI INDICATOR 6.5 (GLOVE) ×1
GLOVE OPTIFIT SS 6.5 STRL BRWN (GLOVE) ×2 IMPLANT
GLOVE SS BIOGEL STRL SZ 7 (GLOVE) ×1 IMPLANT
GLOVE SUPERSENSE BIOGEL SZ 7 (GLOVE) ×1
GLOVE SURG 8.5 LATEX PF (GLOVE) ×2 IMPLANT
GOWN STRL NON-REIN LRG LVL3 (GOWN DISPOSABLE) ×6 IMPLANT
ILLUMINATOR CHOW PICK 25GA (MISCELLANEOUS) ×2 IMPLANT
KIT ROOM TURNOVER OR (KITS) ×2 IMPLANT
KNIFE CRESCENT 2.5 55 ANG (BLADE) ×2 IMPLANT
LENS BIOM SUPER VIEW SET DISP (OPHTHALMIC RELATED) IMPLANT
MARKER SKIN DUAL TIP RULER LAB (MISCELLANEOUS) ×2 IMPLANT
MICROPICK 25G (MISCELLANEOUS)
NEEDLE 18GX1X1/2 (RX/OR ONLY) (NEEDLE) ×2 IMPLANT
NEEDLE 25GX 5/8IN NON SAFETY (NEEDLE) ×2 IMPLANT
NEEDLE 27GAX1X1/2 (NEEDLE) IMPLANT
NEEDLE HYPO 30X.5 LL (NEEDLE) ×4 IMPLANT
NS IRRIG 1000ML POUR BTL (IV SOLUTION) ×2 IMPLANT
PACK VITRECTOMY CUSTOM (CUSTOM PROCEDURE TRAY) ×2 IMPLANT
PAD ARMBOARD 7.5X6 YLW CONV (MISCELLANEOUS) ×4 IMPLANT
PAD EYE OVAL STERILE LF (GAUZE/BANDAGES/DRESSINGS) ×2 IMPLANT
PAK VITRECTOMY PIK 25 GA (OPHTHALMIC RELATED) ×2 IMPLANT
PENCIL BIPOLAR 25GA STR DISP (OPHTHALMIC RELATED) IMPLANT
PICK MICROPICK 25G (MISCELLANEOUS) IMPLANT
PROBE COHERENT CURVED (MISCELLANEOUS) ×2 IMPLANT
PROBE DIRECTIONAL LASER (MISCELLANEOUS) ×2 IMPLANT
REPL STRA BRUSH NEEDLE (NEEDLE) IMPLANT
RESERVOIR BACK FLUSH (MISCELLANEOUS) IMPLANT
ROLLS DENTAL (MISCELLANEOUS) ×4 IMPLANT
SCRAPER DIAMOND DUST MEMBRANE (MISCELLANEOUS) IMPLANT
SET FLUID INJECTOR (SET/KITS/TRAYS/PACK) ×2 IMPLANT
SPONGE SURGIFOAM ABS GEL 12-7 (HEMOSTASIS) ×2 IMPLANT
STOPCOCK 4 WAY LG BORE MALE ST (IV SETS) IMPLANT
SUT CHROMIC 7 0 TG140 8 (SUTURE) ×2 IMPLANT
SUT ETHILON 10 0 CS140 6 (SUTURE) IMPLANT
SUT ETHILON 9 0 TG140 8 (SUTURE) ×2 IMPLANT
SUT POLY NON ABSORB 10-0 8 STR (SUTURE) IMPLANT
SUT SILK 4 0 RB 1 (SUTURE) IMPLANT
SYR 20CC LL (SYRINGE) ×2 IMPLANT
SYR 50ML LL SCALE MARK (SYRINGE) IMPLANT
SYR 5ML LL (SYRINGE) IMPLANT
SYR BULB 3OZ (MISCELLANEOUS) ×2 IMPLANT
SYR TB 1ML LUER SLIP (SYRINGE) ×2 IMPLANT
SYRINGE 10CC LL (SYRINGE) IMPLANT
TAPE SURG TRANSPORE 1 IN (GAUZE/BANDAGES/DRESSINGS) ×1 IMPLANT
TAPE SURGICAL TRANSPORE 1 IN (GAUZE/BANDAGES/DRESSINGS) ×1
TOWEL OR 17X24 6PK STRL BLUE (TOWEL DISPOSABLE) ×6 IMPLANT
TROCAR CANNULA 25GA (CANNULA) IMPLANT
WATER STERILE IRR 1000ML POUR (IV SOLUTION) ×2 IMPLANT

## 2011-07-31 NOTE — Anesthesia Preprocedure Evaluation (Addendum)
Anesthesia Evaluation  Patient identified by MRN, date of birth, ID band Patient awake    Reviewed: Allergy & Precautions, H&P , NPO status , Patient's Chart, lab work & pertinent test results, reviewed documented beta blocker date and time   Airway Mallampati: III TM Distance: >3 FB Neck ROM: Full    Dental  (+) Teeth Intact   Pulmonary sleep apnea and Continuous Positive Airway Pressure Ventilation ,  clear to auscultation        Cardiovascular hypertension, Pt. on home beta blockers and Pt. on medications Regular Normal    Neuro/Psych PSYCHIATRIC DISORDERS CVA (Weakness in left hand and arm), Residual Symptoms    GI/Hepatic negative GI ROS, Neg liver ROS,   Endo/Other  Diabetes mellitus-, Type 1, Insulin Dependent  Renal/GU Renal InsufficiencyRenal disease     Musculoskeletal negative musculoskeletal ROS (+)   Abdominal   Peds  Hematology negative hematology ROS (+)   Anesthesia Other Findings   Reproductive/Obstetrics negative OB ROS                          Anesthesia Physical Anesthesia Plan  ASA: III  Anesthesia Plan: General and General ETT   Post-op Pain Management:    Induction: Intravenous  Airway Management Planned: Oral ETT  Additional Equipment:   Intra-op Plan:   Post-operative Plan: Extubation in OR  Informed Consent: I have reviewed the patients History and Physical, chart, labs and discussed the procedure including the risks, benefits and alternatives for the proposed anesthesia with the patient or authorized representative who has indicated his/her understanding and acceptance.   Dental Advisory Given  Plan Discussed with: CRNA  Anesthesia Plan Comments:        Anesthesia Quick Evaluation

## 2011-07-31 NOTE — H&P (Signed)
I have re examined the patient and there is no change from the date of the H and P.

## 2011-07-31 NOTE — Anesthesia Postprocedure Evaluation (Signed)
  Anesthesia Post-op Note  Patient: Renee [name]  Procedure(s) Performed:  PARS PLANA VITRECTOMY WITH 25 GAUGE - REMOVAL OF SILICONE OIL AND LASER RIGHT EYE  Patient Location: PACU  Anesthesia Type: General  Level of Consciousness: awake  Airway and Oxygen Therapy: Patient Spontanous Breathing and Patient connected to nasal cannula oxygen  Post-op Pain: mild  Post-op Assessment: Post-op Vital signs reviewed, Patient's Cardiovascular Status Stable, Respiratory Function Stable and Patent Airway  Post-op Vital Signs: Reviewed and stable  Complications: No apparent anesthesia complications

## 2011-07-31 NOTE — Progress Notes (Signed)
Phenylephrine 2.5%   07-31-2011    1045  1055     Right eye  zymaxid                      07-31-2011 1045   1055    Right eye Cyclopentolate 1%           07-31-11  1045   1055  Right eye  Tropicamide 1%         07-31-2011   1045 1055   Right eye

## 2011-07-31 NOTE — Brief Op Note (Signed)
07/31/2011  2:32 PM  PATIENT:  Renee [name]  77 y.o. female  PRE-OPERATIVE DIAGNOSIS:  RETINAL DETACHMENT RIGHT SIDE, Silicone oil in eye  POST-OPERATIVE DIAGNOSIS:  RETINAL DETACHMENT RIGHT SIDE  PROCEDURE:  Procedure(s): PARS PLANA VITRECTOMY WITH 25 GAUGE, removal of silicone oil, laser therapy  SURGEON:  Surgeon(s): [name], MD  PHYSICIAN ASSISTANT:   Brief Operative note   Surgeon:  [name], MD...  Assistant:  Deatra Ina SA  Anesthesia: General  Specimen: none  Estimated blood loss:  1cc  Complications: none  Patient sent to PACU in good condition  Composed by [name] MD  Dictation number: [id number]

## 2011-07-31 NOTE — Preoperative (Signed)
Beta Blockers   Reason not to administer Beta Blockers:Not Applicable 

## 2011-07-31 NOTE — Transfer of Care (Signed)
Immediate Anesthesia Transfer of Care Note  Patient: Renee [name]  Procedure(s) Performed:  PARS PLANA VITRECTOMY WITH 25 GAUGE - REMOVAL OF SILICONE OIL AND LASER RIGHT EYE  Patient Location: PACU  Anesthesia Type: General  Level of Consciousness: awake, alert  and oriented  Airway & Oxygen Therapy: Patient Spontanous Breathing and Patient connected to nasal cannula oxygen  Post-op Assessment: Report given to PACU RN and Post -op Vital signs reviewed and stable  Post vital signs: Reviewed and stable  Complications: No apparent anesthesia complications

## 2011-08-01 ENCOUNTER — Telehealth: Payer: Self-pay | Admitting: *Deleted

## 2011-08-01 MED ORDER — GATIFLOXACIN 0.5 % OP SOLN: 1.0000 [drp] | Freq: Four times a day (QID) | OPHTHALMIC | Status: DC

## 2011-08-01 MED ORDER — BACITRACIN-POLYMYXIN B 500-10000 UNIT/GM OP OINT: TOPICAL_OINTMENT | Freq: Three times a day (TID) | OPHTHALMIC | Status: AC

## 2011-08-01 MED ORDER — TETRACAINE HCL 0.5 % OP SOLN
1.0000 [drp] | Freq: Once | OPHTHALMIC | Status: AC
  Administered 2011-08-01: 1 [drp] via OPHTHALMIC

## 2011-08-01 MED ORDER — PREDNISOLONE ACETATE 1 % OP SUSP: 1.0000 [drp] | Freq: Four times a day (QID) | OPHTHALMIC | Status: AC

## 2011-08-01 MED ORDER — BACITRACIN-POLYMYXIN B 500-10000 UNIT/GM OP OINT: TOPICAL_OINTMENT | Freq: Three times a day (TID) | OPHTHALMIC | Status: DC

## 2011-08-01 NOTE — Telephone Encounter (Signed)
Please let pt know unfortunately that I got a detailed letter from [employer] and that due to medicare guideline changes since I saw her last -- they in fact require ANOTHER visit to get it covered  Again-- I am somewhat doubtful they will cover anyway - but if she wants to continue to pursue it -- will need to follow up again for a mobility exam (after her surgery and recovery)  I will hold the form on my desk in case she wants to come back

## 2011-08-01 NOTE — Telephone Encounter (Signed)
[name] states that she needs updated office notes on this patient in order to be able to process an order for a motorized wheelchair.  She says that Medicare guidelines have changed and that she has previously sent a letter stating exactly what the notes should say that would allow her to approve the wheelchair.

## 2011-08-01 NOTE — Op Note (Signed)
NAMERAMATOULAYE, PUGLIA NO.:  [id number]  MEDICAL RECORD NO.:  [id number]  LOCATION:  5151                         FACILITY:  [facility]  PHYSICIAN:  Chrystie Nose. Zigmund Daniel, M.D. DATE OF BIRTH:  01-12-1929  DATE OF PROCEDURE:  07/31/2011 DATE OF DISCHARGE:                              OPERATIVE REPORT   ADMISSION DIAGNOSES: 1. Status post rhegmatogenous retinal detachment, right eye and     silicone placement. 2. Silicone oil in vitreous. 3. Silicone oil in the anterior chamber.  PROCEDURES:  Pars plana vitrectomy, retinal photocoagulation, removal of silicone oil from vitreous cavity, removal of silicone oil from the anterior chamber, all in the right eye.  SURGEON:  Chrystie Nose. Zigmund Daniel, MD  ASSISTANT:  Deatra Ina, SA  ANESTHESIA:  General.  DETAILS:  Usual prep and drape, 25-gauge trocars placed at 8 and 10 o'clock.  The 19-gauge MVR incision was made with a 3 layered incision at 2 o'clock.  The silicone oil extractor was placed at the 2 o'clock position, and silicone oil was extracted from the vitreous cavity, slowly in a controlled manner.  The oil was egressed as BSS was infused under direct visualization with the BIOM viewing system and the wide field BIOM viewing system.  Some oil was cleaned from the backside of the intraocular lens.  Once the oil was removed from the eye, the vitrectomy was inserted, and a pars plana vitrectomy was performed removing some remnants of vitreous and silicone oil.  The endolaser was positioned in the eye, 386 burns were placed around the retinal periphery.  The power was 1000 mV each and 0.1 seconds each.  The anterior chamber was entered with a 30-gauge needle with an oblique entry pointing toward the apex of the central cornea.  Silicone bubbles were removed carefully and slowly from their placement in the anterior chamber along the endothelium.  The endothelium was not touched.  The needle was removed, and the wound was held  until it was secure.  The 25- gauge trocars were removed.  9-0 nylon was used with 2 interrupted sutures to close the sclerotomy point at 2 o'clock.  The conjunctiva was closed with 7-0 chromic suture.  Polymyxin and gentamicin were irrigated into [place] space.  Atropine solution was applied.  Closing pressure was 10 with a Barraquer tonometer.  Complications none.  Duration of 1 hour. The patient was awakened and taken to recovery in satisfactory condition.    Chrystie Nose. Zigmund Daniel, M.D.    JDM/MEDQ  D:  07/31/2011  T:  08/01/2011  Job:  [id number]

## 2011-08-01 NOTE — Telephone Encounter (Signed)
[name] at [employer] notified as instructed by telephone. [name] said medicare guidelines have recently changed completely. [name] will fax a letter that indicates what is needed. [name] has already received the 06/26/11 note but is not enough to satisfy new guidelines. Letter from [employer] received and is on your shelf in the in box.

## 2011-08-01 NOTE — Telephone Encounter (Signed)
My mobility exam was 11/6 -- that is about the best I can do , I was not sure if the chair would be covered or not Please send whatever they need

## 2011-08-01 NOTE — Progress Notes (Signed)
MD went over instructions with pt prior to leaving and pt states she understands. Out via wheelchair with volunteer and sitter.

## 2011-08-01 NOTE — Progress Notes (Signed)
08/01/2011, 6:50 AM  Mental Status:  Awake, Alert, Oriented  Anterior segment: Cornea  Clear    Anterior Chamber Clear    Lens:    IOL  Intra Ocular Pressure 12 mmHg with Tonopen  Vitreous: Clear 0%gas bubble Hemorrhage  Silicone oil  Retina:  Attached Good laser reaction  Impression: Excellent result Retina attached  Final Diagnosis: Principal Problem:  *Retinal detachment   Plan: start post operative eye drops.  Discharge to home.  Give post operative instructions  Renee [name] 08/01/2011, 6:50 AM

## 2011-08-01 NOTE — Discharge Summary (Signed)
No discharge summary needed, OWER patient  See medical records

## 2011-08-02 ENCOUNTER — Encounter (HOSPITAL_COMMUNITY): Payer: Self-pay | Admitting: Ophthalmology

## 2011-08-02 NOTE — Telephone Encounter (Signed)
Left message with Juanda Crumble, Mr Tennison's caregiver and he will have pt call back.

## 2011-08-06 ENCOUNTER — Inpatient Hospital Stay (INDEPENDENT_AMBULATORY_CARE_PROVIDER_SITE_OTHER): Payer: Medicare Other | Admitting: Ophthalmology

## 2011-08-06 DIAGNOSIS — H33009 Unspecified retinal detachment with retinal break, unspecified eye: Secondary | ICD-10-CM

## 2011-08-10 ENCOUNTER — Ambulatory Visit (INDEPENDENT_AMBULATORY_CARE_PROVIDER_SITE_OTHER): Payer: Medicare Other | Admitting: Family Medicine

## 2011-08-10 ENCOUNTER — Encounter: Payer: Self-pay | Admitting: Family Medicine

## 2011-08-10 VITALS — BP 128/62 | HR 72 | Temp 97.8°F | Wt 173.0 lb

## 2011-08-10 DIAGNOSIS — F418 Other specified anxiety disorders: Secondary | ICD-10-CM

## 2011-08-10 DIAGNOSIS — F341 Dysthymic disorder: Secondary | ICD-10-CM

## 2011-08-10 MED ORDER — ALPRAZOLAM 0.5 MG PO TABS
0.5000 mg | ORAL_TABLET | Freq: Every evening | ORAL | Status: AC | PRN
Start: 2011-08-10 — End: 2012-08-09

## 2011-08-10 NOTE — Assessment & Plan Note (Signed)
Overall much better after her eye surgery- and relief from that  Goes to counselor with husband - may consider going herself  Also getting help with him so she can get out more Will work on using xanax only prn/ and if not further imp can inc zoloft Pt does not want to do that yet  Will keep me updated

## 2011-08-10 NOTE — Progress Notes (Signed)
Subjective:    Patient ID: Renee [name], female    DOB: 07-28-1929, 77 y.o.   MRN: [id number]  HPI Here for f/u of anxiety and depression  Had eye surgery- all went well  Vision is not really good - but will take 90 days to improve  Not a lot of pain  Then will be able to get new glasses   Is back on asa   Anxiety / depression Overall - is much improved with xanax and getting surgery over with  Had done well for a while -- with zoloft and remeron Than worse again 2 mo ago when she briefly thought she was having a stroke  She has never gone to a counselor -- but goes with her husband  She does a lot of baking - that keeps her busy -- wonderful   Stress- husband has severe depression -- will never get better   mirtazapine 15 mg at night -- really helped at first with appetite Eats well - nothing tastes good still  Was dreaming a lot   Now taking xanax at night  Helps tremendously- not dreaming a lot  Feels good when she wakes up in the am  Has really helped her anxiety a lot -- just taking at night   Also on zoloft - no side effects    Patient Active Problem List  Diagnoses  . POSTHERPETIC NEURALGIA  . DIABETES MELLITUS, TYPE II  . B12 DEFICIENCY  . HYPERLIPIDEMIA  . HYPERTENSION  . CVA WITH LEFT HEMIPARESIS  . RENAL INSUFFICIENCY  . FIBROCYSTIC BREAST DISEASE  . ROSACEA  . OSTEOARTHRITIS  . BACK PAIN  . MUSCLE WEAKNESS (GENERALIZED)  . OSTEOPENIA  . EDEMA  . URINARY INCONTINENCE, MIXED  . ANGIOEDEMA  . Depression with anxiety  . Sleep apnea  . Retinal detachment  . Mobility impaired   Past Medical History  Diagnosis Date  . Sleep apnea   . Diabetes mellitus     type II  . Hypertension   . Hyperlipidemia   . Stroke     Small vessel sobcortical (in [place] trial) with Dr Leonie Man  . Osteoarthritis   . Angioedema     possibly from voltaren  . Vitamin B 12 deficiency 04/08  . LVH (left ventricular hypertrophy)     and atrial enlargement by echo in past  with nl EF  . Degenerative disc disease   . Osteopenia   . Nasal pruritis   . Renal insufficiency    Past Surgical History  Procedure Date  . Eye surgery     cataract extraction  . Abdominal hysterectomy     BSO-fibroids  . Knee surgery     arthroscope  . Spine surgery 08/09    spinal decompression surgery  . Retinal detachment surgery 02/18/11    times 2  . Back surgery   . Colon surgery     due to punctured intestines  . Appendectomy   . Pars plana vitrectomy 07/31/2011    Procedure: PARS PLANA VITRECTOMY WITH 25 GAUGE;  Surgeon: [name], MD;  Location: [facility];  Service: Ophthalmology;  Laterality: Right;  REMOVAL OF SILICONE OIL AND LASER RIGHT EYE   History  Substance Use Topics  . Smoking status: Never Smoker   . Smokeless tobacco: Not on file  . Alcohol Use: No   Family History  Problem Relation Age of Onset  . Cancer Sister     brain tumor with hemmorhage  . Heart disease Sister  CAD  . COPD Brother    Allergies  Allergen Reactions  . Diclofenac Sodium     REACTION: angioedema  . Pioglitazone     REACTION: swelling   Current Outpatient Prescriptions on File Prior to Visit  Medication Sig Dispense Refill  . amLODipine (NORVASC) 5 MG tablet Take 10 mg by mouth daily. Kidney doctor Dr Holley Raring started 04/03/11.      Marland Kitchen aspirin 325 MG tablet Take 325 mg by mouth daily.       . B-D UF III MINI PEN NEEDLES 31G X 5 MM MISC Daily.      . bacitracin-polymyxin b (POLYSPORIN) ophthalmic ointment Place into the right eye 3 (three) times daily. apply to eye every 12 hours while awake  3.5 g    . calcium-vitamin D (OSCAL WITH D) 500-200 MG-UNIT per tablet Take 1 tablet by mouth daily.       . Cholecalciferol (VITAMIN D) 1000 UNITS capsule Take 1,000 Units by mouth daily.       . cyanocobalamin (,VITAMIN B-12,) 1000 MCG/ML injection Inject 1,000 mcg into the muscle every 8 (eight) weeks.       Marland Kitchen gatifloxacin (ZYMAXID) 0.5 % SOLN Place 1 drop into the right eye 4  (four) times daily.      Marland Kitchen gemfibrozil (LOPID) 600 MG tablet Take 600 mg by mouth daily.        . hydrALAZINE (APRESOLINE) 50 MG tablet Take 50 mg by mouth 3 (three) times daily.        . insulin glargine (LANTUS) 100 UNIT/ML injection Inject 10 Units into the skin at bedtime. Or as directed.       . metoprolol succinate (TOPROL-XL) 25 MG 24 hr tablet 1/2 tablet by mouth twice a day      . mirtazapine (REMERON) 15 MG tablet Take 7.5 mg by mouth at bedtime. 1/2 pill by mouth each bedtime        . Multiple Vitamin (MULTIVITAMIN) capsule Take 1 capsule by mouth daily.       Marland Kitchen oxybutynin (DITROPAN-XL) 10 MG 24 hr tablet Take 1 tablet (10 mg total) by mouth daily.  90 tablet  3  . prednisoLONE acetate (PRED FORTE) 1 % ophthalmic suspension Place 1 drop into the right eye 4 (four) times daily.  5 mL    . sertraline (ZOLOFT) 50 MG tablet Take 50 mg by mouth daily.        . trandolapril (MAVIK) 4 MG tablet Take 4 mg by mouth daily.             Review of Systems Review of Systems  Constitutional: Negative for fever, appetite change, and unexpected weight change. pos for fatigue  Eyes: Negative for pain and pos for vision loss  Respiratory: Negative for cough and shortness of breath.   Cardiovascular: Negative for cp or palpitations    Gastrointestinal: Negative for nausea, diarrhea and constipation.  Genitourinary: Negative for urgency and frequency.  Skin: Negative for pallor or rash   Neurological: Negative for , light-headedness, numbness and headaches.  Hematological: Negative for adenopathy. Does not bruise/bleed easily.  Psychiatric/Behavioral:pos for depression and anxiety that are improved .          Objective:   Physical Exam  Constitutional: She appears well-developed and well-nourished. No distress.  HENT:  Head: Normocephalic and atraumatic.  Mouth/Throat: Oropharynx is clear and moist.  Eyes: EOM are normal. Pupils are equal, round, and reactive to light.  Neck: Normal  range of motion. Neck  supple. No JVD present. No thyromegaly present.  Cardiovascular: Normal rate, regular rhythm, normal heart sounds and intact distal pulses.  Exam reveals no gallop.   Pulmonary/Chest: Effort normal and breath sounds normal. No respiratory distress. She has no wheezes.  Musculoskeletal: She exhibits no edema.  Lymphadenopathy:    She has no cervical adenopathy.  Neurological: She is alert. She has normal reflexes. She displays no tremor. No cranial nerve deficit. Coordination normal.       Hemiparesis and facial droop baseline  Skin: Skin is warm and dry. No rash noted. No erythema. No pallor.  Psychiatric: She has a normal mood and affect.       Improved affect Talkative today  Good eye contact and comm skills  Not tearful          Assessment & Plan:

## 2011-08-10 NOTE — Telephone Encounter (Signed)
Pt was seen in office today

## 2011-08-10 NOTE — Patient Instructions (Signed)
For now - since you are doing better- try to take the xanax only when you need it (only at night) and be very careful of falls with it  Also get out more and be more active -- that will help a lot  Think about going back to Sunday school  If your mood worsens (depression or anxiety- I will want to increase your zoloft -- which is sertaline)- so let me know  Keep going to counselor with your husband -- but if you want to see a counselor separately let me know

## 2011-08-28 ENCOUNTER — Other Ambulatory Visit: Payer: Self-pay | Admitting: *Deleted

## 2011-08-28 MED ORDER — TRANDOLAPRIL 4 MG PO TABS: 4.0000 mg | ORAL_TABLET | Freq: Every day | ORAL | Status: DC

## 2011-08-28 NOTE — Telephone Encounter (Signed)
Will refill electronically  

## 2011-08-31 ENCOUNTER — Other Ambulatory Visit: Payer: Self-pay | Admitting: Internal Medicine

## 2011-08-31 MED ORDER — SERTRALINE HCL 50 MG PO TABS: 50.0000 mg | ORAL_TABLET | Freq: Every day | ORAL | Status: DC

## 2011-08-31 NOTE — Telephone Encounter (Signed)
Will refill electronically  

## 2011-08-31 NOTE — Telephone Encounter (Signed)
[facility] is requesting refill.  Last seen 08/06/11

## 2011-09-03 ENCOUNTER — Ambulatory Visit (INDEPENDENT_AMBULATORY_CARE_PROVIDER_SITE_OTHER): Payer: Medicare Other | Admitting: Ophthalmology

## 2011-09-03 DIAGNOSIS — H33009 Unspecified retinal detachment with retinal break, unspecified eye: Secondary | ICD-10-CM

## 2011-09-11 DIAGNOSIS — I10 Essential (primary) hypertension: Secondary | ICD-10-CM | POA: Diagnosis not present

## 2011-09-11 DIAGNOSIS — E119 Type 2 diabetes mellitus without complications: Secondary | ICD-10-CM | POA: Diagnosis not present

## 2011-09-11 DIAGNOSIS — N183 Chronic kidney disease, stage 3 unspecified: Secondary | ICD-10-CM | POA: Diagnosis not present

## 2011-10-01 ENCOUNTER — Telehealth: Payer: Self-pay | Admitting: Family Medicine

## 2011-10-01 DIAGNOSIS — I10 Essential (primary) hypertension: Secondary | ICD-10-CM

## 2011-10-01 DIAGNOSIS — E785 Hyperlipidemia, unspecified: Secondary | ICD-10-CM

## 2011-10-01 DIAGNOSIS — R609 Edema, unspecified: Secondary | ICD-10-CM

## 2011-10-01 DIAGNOSIS — E119 Type 2 diabetes mellitus without complications: Secondary | ICD-10-CM

## 2011-10-01 DIAGNOSIS — E538 Deficiency of other specified B group vitamins: Secondary | ICD-10-CM

## 2011-10-01 DIAGNOSIS — N259 Disorder resulting from impaired renal tubular function, unspecified: Secondary | ICD-10-CM

## 2011-10-01 DIAGNOSIS — M899 Disorder of bone, unspecified: Secondary | ICD-10-CM

## 2011-10-01 NOTE — Telephone Encounter (Signed)
Message copied by Abner Greenspan on Mon Oct 01, 2011 10:27 PM ------      Message from: Marchia Bond      Created: Wed Sep 26, 2011 12:59 PM      Regarding: F/u labs Tues 10/02/11       Please order  future f/u labs for pt's upcomming lab appt.      Thanks      Aniceto Boss

## 2011-10-02 ENCOUNTER — Other Ambulatory Visit (INDEPENDENT_AMBULATORY_CARE_PROVIDER_SITE_OTHER): Payer: Medicare Other

## 2011-10-02 DIAGNOSIS — M949 Disorder of cartilage, unspecified: Secondary | ICD-10-CM | POA: Diagnosis not present

## 2011-10-02 DIAGNOSIS — E785 Hyperlipidemia, unspecified: Secondary | ICD-10-CM

## 2011-10-02 DIAGNOSIS — E119 Type 2 diabetes mellitus without complications: Secondary | ICD-10-CM

## 2011-10-02 DIAGNOSIS — E538 Deficiency of other specified B group vitamins: Secondary | ICD-10-CM | POA: Diagnosis not present

## 2011-10-02 DIAGNOSIS — I10 Essential (primary) hypertension: Secondary | ICD-10-CM

## 2011-10-02 DIAGNOSIS — M899 Disorder of bone, unspecified: Secondary | ICD-10-CM

## 2011-10-02 DIAGNOSIS — R609 Edema, unspecified: Secondary | ICD-10-CM | POA: Diagnosis not present

## 2011-10-02 DIAGNOSIS — N259 Disorder resulting from impaired renal tubular function, unspecified: Secondary | ICD-10-CM

## 2011-10-02 LAB — LIPID PANEL
Cholesterol: 175 mg/dL (ref 0–200)
HDL: 42.9 mg/dL (ref >39.00)
LDL Cholesterol: 115 mg/dL — ABNORMAL HIGH (ref 0–99)
Total CHOL/HDL Ratio: 4
Triglycerides: 84 mg/dL (ref 0.0–149.0)
VLDL: 16.8 mg/dL (ref 0.0–40.0)

## 2011-10-02 LAB — CBC WITH DIFFERENTIAL/PLATELET
Basophils Absolute: 0 10*3/uL (ref 0.0–0.1)
Basophils Relative: 0.9 % (ref 0.0–3.0)
Eosinophils Absolute: 0.2 10*3/uL (ref 0.0–0.7)
Eosinophils Relative: 5 % (ref 0.0–5.0)
HCT: 31.8 % — ABNORMAL LOW (ref 36.0–46.0)
Hemoglobin: 10.6 g/dL — ABNORMAL LOW (ref 12.0–15.0)
Lymphocytes Relative: 18.7 % (ref 12.0–46.0)
Lymphs Abs: 0.9 10*3/uL (ref 0.7–4.0)
MCHC: 33.5 g/dL (ref 30.0–36.0)
MCV: 84.3 fl (ref 78.0–100.0)
Monocytes Absolute: 0.4 10*3/uL (ref 0.1–1.0)
Monocytes Relative: 8.3 % (ref 3.0–12.0)
Neutro Abs: 3.1 10*3/uL (ref 1.4–7.7)
Neutrophils Relative %: 67.1 % (ref 43.0–77.0)
Platelets: 160 10*3/uL (ref 150.0–400.0)
RBC: 3.77 Mil/uL — ABNORMAL LOW (ref 3.87–5.11)
RDW: 14.8 % — ABNORMAL HIGH (ref 11.5–14.6)
WBC: 4.7 10*3/uL (ref 4.5–10.5)

## 2011-10-02 LAB — VITAMIN B12: Vitamin B-12: 188 pg/mL — ABNORMAL LOW (ref 211–911)

## 2011-10-02 LAB — HEPATIC FUNCTION PANEL
ALT: 12 U/L (ref 0–35)
AST: 16 U/L (ref 0–37)
Albumin: 3.5 g/dL (ref 3.5–5.2)
Alkaline Phosphatase: 62 U/L (ref 39–117)
Bilirubin, Direct: 0 mg/dL (ref 0.0–0.3)
Total Bilirubin: 0.6 mg/dL (ref 0.3–1.2)
Total Protein: 6 g/dL (ref 6.0–8.3)

## 2011-10-02 LAB — RENAL FUNCTION PANEL
Albumin: 3.5 g/dL (ref 3.5–5.2)
BUN: 26 mg/dL — ABNORMAL HIGH (ref 6–23)
CO2: 27 mEq/L (ref 19–32)
Calcium: 9.3 mg/dL (ref 8.4–10.5)
Chloride: 110 mEq/L (ref 96–112)
Creatinine, Ser: 1.5 mg/dL — ABNORMAL HIGH (ref 0.4–1.2)
GFR: 36.88 mL/min — ABNORMAL LOW (ref >60.00)
Glucose, Bld: 70 mg/dL (ref 70–99)
Phosphorus: 3.5 mg/dL (ref 2.3–4.6)
Potassium: 4 mEq/L (ref 3.5–5.1)
Sodium: 145 mEq/L (ref 135–145)

## 2011-10-02 LAB — TSH: TSH: 4.11 u[IU]/mL (ref 0.35–5.50)

## 2011-10-02 LAB — HEMOGLOBIN A1C: Hgb A1c MFr Bld: 5.6 % (ref 4.6–6.5)

## 2011-10-03 ENCOUNTER — Telehealth: Payer: Self-pay | Admitting: Family Medicine

## 2011-10-03 LAB — VITAMIN D 25 HYDROXY (VIT D DEFICIENCY, FRACTURES): Vit D, 25-Hydroxy: 59 ng/mL (ref 30–89)

## 2011-10-03 NOTE — Telephone Encounter (Signed)
[employer] form for scooter in folder, pending appointment. Appt scheduled for Feb 15th.

## 2011-10-03 NOTE — Telephone Encounter (Signed)
Will discuss it then

## 2011-10-05 ENCOUNTER — Encounter: Payer: Self-pay | Admitting: Family Medicine

## 2011-10-05 ENCOUNTER — Ambulatory Visit (INDEPENDENT_AMBULATORY_CARE_PROVIDER_SITE_OTHER): Payer: Medicare Other | Admitting: Family Medicine

## 2011-10-05 VITALS — BP 164/76 | HR 56 | Temp 97.5°F | Ht 63.0 in | Wt 174.5 lb

## 2011-10-05 DIAGNOSIS — E119 Type 2 diabetes mellitus without complications: Secondary | ICD-10-CM

## 2011-10-05 DIAGNOSIS — R69 Illness, unspecified: Secondary | ICD-10-CM | POA: Diagnosis not present

## 2011-10-05 DIAGNOSIS — E785 Hyperlipidemia, unspecified: Secondary | ICD-10-CM

## 2011-10-05 DIAGNOSIS — E538 Deficiency of other specified B group vitamins: Secondary | ICD-10-CM | POA: Diagnosis not present

## 2011-10-05 DIAGNOSIS — N259 Disorder resulting from impaired renal tubular function, unspecified: Secondary | ICD-10-CM

## 2011-10-05 DIAGNOSIS — I10 Essential (primary) hypertension: Secondary | ICD-10-CM | POA: Diagnosis not present

## 2011-10-05 DIAGNOSIS — I69959 Hemiplegia and hemiparesis following unspecified cerebrovascular disease affecting unspecified side: Secondary | ICD-10-CM

## 2011-10-05 DIAGNOSIS — Z7409 Other reduced mobility: Secondary | ICD-10-CM

## 2011-10-05 MED ORDER — CYANOCOBALAMIN 1000 MCG/ML IJ SOLN
1000.0000 ug | Freq: Once | INTRAMUSCULAR | Status: AC
  Administered 2011-10-05: 1000 ug via INTRAMUSCULAR

## 2011-10-05 NOTE — Progress Notes (Signed)
Subjective:    Patient ID: Renee [name], female    DOB: [birth year], 78 y.o.   MRN: [id number]  HPI Here for another mobility exam and also to f/u for chronic medical problems Feels ok  Worked out in yard yesterday and kind of worn out   bp is  164/76   Today- better on 2nd check No cp or palpitations or headaches or edema  No side effects to medicines    Diabetes Home sugar results - are very good overall - no low sugars at all  DM diet - fairly good with that  Exercise -- working a bit  A1C last great at 5.6 No problems with medications - lantus  Renal protection- has renal insuff and sees renal Last eye exam closely followed for retinal detatchment- surgery Vision is bad in R eye   Wt isup 1 lb with bmi of 30  Cr is up to 1.5 today-- is fairly stable Trying to drink water  Sees renal -- last visit was positive   Lipids are stable  Lab Results  Component Value Date   CHOL 175 10/02/2011   HDL 42.90 10/02/2011   LDLCALC 115* 10/02/2011   TRIG 84.0 10/02/2011   CHOLHDL 4 10/02/2011   on lopid and low fat diet  Zoster status-- ins won't pay for imm and she cannot afford it   Mobility Uses walker- cannot get up or down without assistance  Unsteady Main ADL - cannot go down in basement -- because she cannot go down stairs and needs to acess from the outside -= this is her main problem  Goes from room to room in house - uses walker - cannot use stairs A cane does not give her enough support and she falls  Gilford Rile works inside but not safe outside (tries to rake a little - all she can do) L arm is weak -- so she cannot propel a regular wheelchair Wants a motorized wheelchair  This would be used outside to get down a hill to get into her basement via a door that she could not access otherwise due to inability to use stairs   Patient Active Problem List  Diagnoses  . POSTHERPETIC NEURALGIA  . DIABETES MELLITUS, TYPE II  . B12 DEFICIENCY  . HYPERLIPIDEMIA  . HYPERTENSION    . CVA WITH LEFT HEMIPARESIS  . RENAL INSUFFICIENCY  . FIBROCYSTIC BREAST DISEASE  . ROSACEA  . OSTEOARTHRITIS  . BACK PAIN  . MUSCLE WEAKNESS (GENERALIZED)  . OSTEOPENIA  . EDEMA  . URINARY INCONTINENCE, MIXED  . ANGIOEDEMA  . Depression with anxiety  . Sleep apnea  . Retinal detachment  . Mobility impaired   Past Medical History  Diagnosis Date  . Sleep apnea   . Diabetes mellitus     type II  . Hypertension   . Hyperlipidemia   . Stroke     Small vessel sobcortical (in [place] trial) with Dr Leonie Man  . Osteoarthritis   . Angioedema     possibly from voltaren  . Vitamin B 12 deficiency 04/08  . LVH (left ventricular hypertrophy)     and atrial enlargement by echo in past with nl EF  . Degenerative disc disease   . Osteopenia   . Nasal pruritis   . Renal insufficiency    Past Surgical History  Procedure Date  . Eye surgery     cataract extraction  . Abdominal hysterectomy     BSO-fibroids  . Knee surgery  arthroscope  . Spine surgery 08/09    spinal decompression surgery  . Retinal detachment surgery 02/18/11    times 2  . Back surgery   . Colon surgery     due to punctured intestines  . Appendectomy   . Pars plana vitrectomy 07/31/2011    Procedure: PARS PLANA VITRECTOMY WITH 25 GAUGE;  Surgeon: [name], MD;  Location: [facility];  Service: Ophthalmology;  Laterality: Right;  REMOVAL OF SILICONE OIL AND LASER RIGHT EYE   History  Substance Use Topics  . Smoking status: Never Smoker   . Smokeless tobacco: Not on file  . Alcohol Use: No   Family History  Problem Relation Age of Onset  . Cancer Sister     brain tumor with hemmorhage  . Heart disease Sister     CAD  . COPD Brother    Allergies  Allergen Reactions  . Diclofenac Sodium     REACTION: angioedema  . Pioglitazone     REACTION: swelling   Current Outpatient Prescriptions on File Prior to Visit  Medication Sig Dispense Refill  . ALPRAZolam (XANAX) 0.5 MG tablet Take 1 tablet (0.5  mg total) by mouth at bedtime as needed for anxiety.  30 tablet  3  . amLODipine (NORVASC) 5 MG tablet Take 10 mg by mouth daily. Kidney doctor Dr Holley Raring started 04/03/11.      Marland Kitchen aspirin 325 MG tablet Take 325 mg by mouth daily.       . B-D UF III MINI PEN NEEDLES 31G X 5 MM MISC Daily.      . calcium-vitamin D (OSCAL WITH D) 500-200 MG-UNIT per tablet Take 1 tablet by mouth daily.       . Cholecalciferol (VITAMIN D) 1000 UNITS capsule Take 1,000 Units by mouth daily.       . cyanocobalamin (,VITAMIN B-12,) 1000 MCG/ML injection Inject 1,000 mcg into the muscle every 8 (eight) weeks.       Marland Kitchen gatifloxacin (ZYMAXID) 0.5 % SOLN Place 1 drop into the right eye 4 (four) times daily.      Marland Kitchen gemfibrozil (LOPID) 600 MG tablet Take 600 mg by mouth daily.        . hydrALAZINE (APRESOLINE) 50 MG tablet Take 50 mg by mouth 3 (three) times daily.        . insulin glargine (LANTUS) 100 UNIT/ML injection Inject 10 Units into the skin at bedtime. Or as directed.       . metoprolol succinate (TOPROL-XL) 25 MG 24 hr tablet Take 12.5 mg by mouth daily. 1/2 tablet by mouth twice a day      . mirtazapine (REMERON) 15 MG tablet Take 7.5 mg by mouth at bedtime. 1/2 pill by mouth each bedtime        . Multiple Vitamin (MULTIVITAMIN) capsule Take 1 capsule by mouth daily.       Marland Kitchen oxybutynin (DITROPAN-XL) 10 MG 24 hr tablet Take 1 tablet (10 mg total) by mouth daily.  90 tablet  3  . sertraline (ZOLOFT) 50 MG tablet Take 1 tablet (50 mg total) by mouth daily.  90 tablet  3  . trandolapril (MAVIK) 4 MG tablet Take 1 tablet (4 mg total) by mouth daily.  90 tablet  3          Review of Systems Review of Systems  Constitutional: Negative for fever, appetite change, fatigue and unexpected weight change.  Eyes: Negative for pain and no change in vision from last visit  Respiratory: Negative for cough and shortness of breath.   Cardiovascular: Negative for cp or palpitations    Gastrointestinal: Negative for nausea,  diarrhea and constipation.  Genitourinary: Negative for urgency and frequency.  Skin: Negative for pallor or rash   MSK pos for weakness on L with inability to walk without assistance Neurological: Negative for light-headedness, numbness and headaches. pos for L sided weakness Hematological: Negative for adenopathy. Does not bruise/bleed easily.  Psychiatric/Behavioral: Negative for dysphoric mood. The patient is not nervous/anxious.          Objective:   Physical Exam  Constitutional: She appears well-developed and well-nourished. No distress.  HENT:  Head: Normocephalic and atraumatic.  Mouth/Throat: Oropharynx is clear and moist.       Baseline facial droop  Eyes: Conjunctivae and EOM are normal. Pupils are equal, round, and reactive to light. Right eye exhibits no discharge. Left eye exhibits no discharge. No scleral icterus.  Neck: Normal range of motion. Neck supple. No JVD present. Carotid bruit is not present. No thyromegaly present.  Cardiovascular: Normal rate, regular rhythm and normal heart sounds.  Exam reveals no gallop.   Pulmonary/Chest: Breath sounds normal. No respiratory distress. She has no wheezes.  Abdominal: Soft. She exhibits no distension and no mass. There is no tenderness.  Musculoskeletal: She exhibits no edema and no tenderness.       Pt cannot walk without assistance of another person or walker  Pt cannot rise from chair without assistance Gait is slow/ wide based and shuffling   Lymphadenopathy:    She has no cervical adenopathy.  Neurological: She is alert. She displays atrophy and abnormal reflex. She displays no tremor. No cranial nerve deficit or sensory deficit. She exhibits abnormal muscle tone. Coordination and gait abnormal.       Generalized weakness on L Arm strength and grip are poor  Decreased DTRs L UE and LE No contractures  Baseline facial droop    Skin: Skin is warm and dry. No rash noted. No erythema. No pallor.  Psychiatric: She  has a normal mood and affect.          Assessment & Plan:

## 2011-10-05 NOTE — Patient Instructions (Addendum)
I will fill out your mobility paperwork and send it  Try to work on exercise - to get stronger Sugar and cholesterol stable bp is up today- have your kidney doctor re check it  Follow up with me in 6 months with labs prior  B12 shot today

## 2011-10-07 NOTE — Assessment & Plan Note (Signed)
Better on 2nd check today bp in fair control at this time  No changes needed  Disc lifstyle change with low sodium diet and exercise

## 2011-10-07 NOTE — Assessment & Plan Note (Signed)
Stable with lopid and diet  Disc goals for lipids and reasons to control them Rev labs with pt Rev low sat fat diet in detail

## 2011-10-07 NOTE — Assessment & Plan Note (Signed)
Doing well with lantus and diet  No changes  utd opthy and imms

## 2011-10-07 NOTE — Assessment & Plan Note (Signed)
Mobility exam repeated today  Pt is able to ambulate in house with walker However - her L hand/ arm are too weak from cva to operate manual wheelchair She is unable to access her basement since unable to use stairs at all  She desires power wheelchair to get outdoors and go down hill in property so she can access basement from outdoor entrance  Will fill out paperwork today

## 2011-10-07 NOTE — Assessment & Plan Note (Signed)
Multifactorial  Overall fairly stable Urged to continue good water intake  Will continue renal physician follow up

## 2011-10-07 NOTE — Assessment & Plan Note (Signed)
Shot today- this helps energy level

## 2011-10-07 NOTE — Assessment & Plan Note (Signed)
Pt unable to ambulate without walker See mobility assessment

## 2011-10-16 ENCOUNTER — Other Ambulatory Visit: Payer: Self-pay | Admitting: *Deleted

## 2011-10-16 MED ORDER — INSULIN PEN NEEDLE 31G X 5 MM MISC: Status: DC

## 2011-10-16 MED ORDER — OXYBUTYNIN CHLORIDE ER 10 MG PO TB24: 10.0000 mg | ORAL_TABLET | Freq: Every day | ORAL | Status: DC

## 2011-10-24 DIAGNOSIS — L821 Other seborrheic keratosis: Secondary | ICD-10-CM | POA: Diagnosis not present

## 2011-11-02 DIAGNOSIS — I10 Essential (primary) hypertension: Secondary | ICD-10-CM | POA: Diagnosis not present

## 2011-11-02 DIAGNOSIS — N183 Chronic kidney disease, stage 3 unspecified: Secondary | ICD-10-CM | POA: Diagnosis not present

## 2011-11-14 ENCOUNTER — Other Ambulatory Visit: Payer: Self-pay

## 2011-11-14 MED ORDER — MIRTAZAPINE 15 MG PO TABS: 7.5000 mg | ORAL_TABLET | Freq: Every day | ORAL | Status: DC

## 2011-11-14 NOTE — Telephone Encounter (Signed)
[facility] faxed refill request Mirtazapine 15 mg. Pt last seen 10/05/11 Please advise.

## 2011-11-14 NOTE — Telephone Encounter (Signed)
Will refill electronically  

## 2011-11-16 ENCOUNTER — Encounter (INDEPENDENT_AMBULATORY_CARE_PROVIDER_SITE_OTHER): Payer: Medicare Other | Admitting: Ophthalmology

## 2011-11-16 DIAGNOSIS — H3530 Unspecified macular degeneration: Secondary | ICD-10-CM

## 2011-11-16 DIAGNOSIS — H35379 Puckering of macula, unspecified eye: Secondary | ICD-10-CM | POA: Diagnosis not present

## 2011-11-16 DIAGNOSIS — I10 Essential (primary) hypertension: Secondary | ICD-10-CM | POA: Diagnosis not present

## 2011-11-16 DIAGNOSIS — H43819 Vitreous degeneration, unspecified eye: Secondary | ICD-10-CM

## 2011-11-16 DIAGNOSIS — H33009 Unspecified retinal detachment with retinal break, unspecified eye: Secondary | ICD-10-CM | POA: Diagnosis not present

## 2011-11-16 DIAGNOSIS — H35039 Hypertensive retinopathy, unspecified eye: Secondary | ICD-10-CM

## 2011-12-07 DIAGNOSIS — E119 Type 2 diabetes mellitus without complications: Secondary | ICD-10-CM | POA: Diagnosis not present

## 2012-02-18 DIAGNOSIS — I10 Essential (primary) hypertension: Secondary | ICD-10-CM | POA: Diagnosis not present

## 2012-02-18 DIAGNOSIS — N183 Chronic kidney disease, stage 3 unspecified: Secondary | ICD-10-CM | POA: Diagnosis not present

## 2012-03-27 ENCOUNTER — Telehealth: Payer: Self-pay | Admitting: Family Medicine

## 2012-03-27 DIAGNOSIS — E119 Type 2 diabetes mellitus without complications: Secondary | ICD-10-CM

## 2012-03-27 DIAGNOSIS — E538 Deficiency of other specified B group vitamins: Secondary | ICD-10-CM

## 2012-03-27 DIAGNOSIS — I10 Essential (primary) hypertension: Secondary | ICD-10-CM

## 2012-03-27 DIAGNOSIS — N259 Disorder resulting from impaired renal tubular function, unspecified: Secondary | ICD-10-CM

## 2012-03-27 NOTE — Telephone Encounter (Signed)
Message copied by Abner Greenspan on Thu Mar 27, 2012 11:11 PM ------      Message from: Ellamae Sia      Created: Fri Mar 21, 2012 10:27 AM      Regarding: Lab orders for Friday, 8.9.13       Labs for a  6 month f/u

## 2012-03-28 ENCOUNTER — Other Ambulatory Visit (INDEPENDENT_AMBULATORY_CARE_PROVIDER_SITE_OTHER): Payer: Medicare Other

## 2012-03-28 DIAGNOSIS — E538 Deficiency of other specified B group vitamins: Secondary | ICD-10-CM

## 2012-03-28 DIAGNOSIS — I10 Essential (primary) hypertension: Secondary | ICD-10-CM | POA: Diagnosis not present

## 2012-03-28 DIAGNOSIS — N259 Disorder resulting from impaired renal tubular function, unspecified: Secondary | ICD-10-CM | POA: Diagnosis not present

## 2012-03-28 DIAGNOSIS — E119 Type 2 diabetes mellitus without complications: Secondary | ICD-10-CM | POA: Diagnosis not present

## 2012-03-28 LAB — CBC WITH DIFFERENTIAL/PLATELET
Basophils Absolute: 0 10*3/uL (ref 0.0–0.1)
Basophils Relative: 0.7 % (ref 0.0–3.0)
Eosinophils Absolute: 0.3 10*3/uL (ref 0.0–0.7)
Eosinophils Relative: 7.4 % — ABNORMAL HIGH (ref 0.0–5.0)
HCT: 30.6 % — ABNORMAL LOW (ref 36.0–46.0)
Hemoglobin: 10 g/dL — ABNORMAL LOW (ref 12.0–15.0)
Lymphocytes Relative: 23.4 % (ref 12.0–46.0)
Lymphs Abs: 1 10*3/uL (ref 0.7–4.0)
MCHC: 32.8 g/dL (ref 30.0–36.0)
MCV: 82.5 fl (ref 78.0–100.0)
Monocytes Absolute: 0.3 10*3/uL (ref 0.1–1.0)
Monocytes Relative: 7.3 % (ref 3.0–12.0)
Neutro Abs: 2.6 10*3/uL (ref 1.4–7.7)
Neutrophils Relative %: 61.2 % (ref 43.0–77.0)
Platelets: 170 10*3/uL (ref 150.0–400.0)
RBC: 3.71 Mil/uL — ABNORMAL LOW (ref 3.87–5.11)
RDW: 15.7 % — ABNORMAL HIGH (ref 11.5–14.6)
WBC: 4.2 10*3/uL — ABNORMAL LOW (ref 4.5–10.5)

## 2012-03-28 LAB — COMPREHENSIVE METABOLIC PANEL
ALT: 14 U/L (ref 0–35)
AST: 18 U/L (ref 0–37)
Albumin: 3.4 g/dL — ABNORMAL LOW (ref 3.5–5.2)
Alkaline Phosphatase: 70 U/L (ref 39–117)
BUN: 18 mg/dL (ref 6–23)
CO2: 28 mEq/L (ref 19–32)
Calcium: 8.8 mg/dL (ref 8.4–10.5)
Chloride: 105 mEq/L (ref 96–112)
Creatinine, Ser: 1.3 mg/dL — ABNORMAL HIGH (ref 0.4–1.2)
GFR: 41.78 mL/min — ABNORMAL LOW (ref >60.00)
Glucose, Bld: 73 mg/dL (ref 70–99)
Potassium: 4.1 mEq/L (ref 3.5–5.1)
Sodium: 140 mEq/L (ref 135–145)
Total Bilirubin: 0.6 mg/dL (ref 0.3–1.2)
Total Protein: 5.7 g/dL — ABNORMAL LOW (ref 6.0–8.3)

## 2012-03-28 LAB — VITAMIN B12: Vitamin B-12: 169 pg/mL — ABNORMAL LOW (ref 211–911)

## 2012-03-28 LAB — HEMOGLOBIN A1C: Hgb A1c MFr Bld: 5.6 % (ref 4.6–6.5)

## 2012-04-01 DIAGNOSIS — Z1231 Encounter for screening mammogram for malignant neoplasm of breast: Secondary | ICD-10-CM | POA: Diagnosis not present

## 2012-04-02 ENCOUNTER — Encounter: Payer: Self-pay | Admitting: Family Medicine

## 2012-04-04 ENCOUNTER — Ambulatory Visit (INDEPENDENT_AMBULATORY_CARE_PROVIDER_SITE_OTHER): Payer: Medicare Other | Admitting: Family Medicine

## 2012-04-04 ENCOUNTER — Encounter: Payer: Self-pay | Admitting: Family Medicine

## 2012-04-04 VITALS — BP 130/68 | HR 60 | Temp 98.1°F | Ht 64.0 in | Wt 174.8 lb

## 2012-04-04 DIAGNOSIS — E538 Deficiency of other specified B group vitamins: Secondary | ICD-10-CM | POA: Diagnosis not present

## 2012-04-04 DIAGNOSIS — E119 Type 2 diabetes mellitus without complications: Secondary | ICD-10-CM

## 2012-04-04 DIAGNOSIS — I10 Essential (primary) hypertension: Secondary | ICD-10-CM | POA: Diagnosis not present

## 2012-04-04 DIAGNOSIS — Z23 Encounter for immunization: Secondary | ICD-10-CM

## 2012-04-04 DIAGNOSIS — N259 Disorder resulting from impaired renal tubular function, unspecified: Secondary | ICD-10-CM | POA: Diagnosis not present

## 2012-04-04 DIAGNOSIS — E785 Hyperlipidemia, unspecified: Secondary | ICD-10-CM

## 2012-04-04 MED ORDER — CYANOCOBALAMIN 1000 MCG/ML IJ SOLN
1000.0000 ug | Freq: Once | INTRAMUSCULAR | Status: AC
  Administered 2012-04-04: 1000 ug via INTRAMUSCULAR

## 2012-04-04 NOTE — Assessment & Plan Note (Signed)
b12 is low Will give shot today and inc frequency of shots to Q mo F/u 6 mo

## 2012-04-04 NOTE — Progress Notes (Signed)
Subjective:    Patient ID: Renee [name], female    DOB: [birth year], 78 y.o.   MRN: [id number]  HPI Here for f/u of chronic conditions   Doing well - overall - busy summer -more active and helping her sister with the garden  Only fell one time- did have abrasion on her arm  Knows she needs to be more careful   bp is stable today  No cp or palpitations or headaches or edema  No side effects to medicines  BP Readings from Last 3 Encounters:  04/04/12 130/68  10/05/11 164/76  08/10/11 128/62     Wt is stable with bmi of 29 No changes   May need Td- will get that today  Cr is 1.3 down from 1.5 Sees renal - last visit last month  Also hb is 10 -down a bit (pt says renal watches this )  B12 has trended down to 169- shot every 8 weeks  Diabetes Home sugar results - very good - 80s in the am (70s-90s for the most part) DM diet -not much appetite  Exercise - as much as she can  Symptoms-none A1C last  Lab Results  Component Value Date   HGBA1C 5.6 03/28/2012   on lantus alone No low sugars at all  No problems with medications  Renal protection-on mavik  Last eye exam -2-3 mo ago  Lab Results  Component Value Date   CHOL 175 10/02/2011   CHOL 182 03/28/2011   CHOL 188 12/27/2010   Lab Results  Component Value Date   HDL 42.90 10/02/2011   HDL 36.80* 03/28/2011   HDL 31.90* 12/27/2010   Lab Results  Component Value Date   LDLCALC 115* 10/02/2011   LDLCALC 114* 03/28/2011   LDLCALC 131* 12/27/2010   Lab Results  Component Value Date   TRIG 84.0 10/02/2011   TRIG 158.0* 03/28/2011   TRIG 128.0 12/27/2010   Lab Results  Component Value Date   CHOLHDL 4 10/02/2011   CHOLHDL 5 03/28/2011   CHOLHDL 6 12/27/2010   No results found for this basename: LDLDIRECT   that is doing fine  Is eating a lot of vegetables   Had a mammogram this week   Patient Active Problem List  Diagnosis  . POSTHERPETIC NEURALGIA  . DIABETES MELLITUS, TYPE II  . B12 DEFICIENCY  . HYPERLIPIDEMIA  .  HYPERTENSION  . CVA WITH LEFT HEMIPARESIS  . RENAL INSUFFICIENCY  . FIBROCYSTIC BREAST DISEASE  . ROSACEA  . OSTEOARTHRITIS  . BACK PAIN  . MUSCLE WEAKNESS (GENERALIZED)  . OSTEOPENIA  . EDEMA  . URINARY INCONTINENCE, MIXED  . ANGIOEDEMA  . Depression with anxiety  . Sleep apnea  . Retinal detachment  . Mobility impaired   Past Medical History  Diagnosis Date  . Sleep apnea   . Diabetes mellitus     type II  . Hypertension   . Hyperlipidemia   . Stroke     Small vessel sobcortical (in [place] trial) with Dr Leonie Man  . Osteoarthritis   . Angioedema     possibly from voltaren  . Vitamin B 12 deficiency 04/08  . LVH (left ventricular hypertrophy)     and atrial enlargement by echo in past with nl EF  . Degenerative disc disease   . Osteopenia   . Nasal pruritis   . Renal insufficiency    Past Surgical History  Procedure Date  . Eye surgery     cataract extraction  . Abdominal hysterectomy  BSO-fibroids  . Knee surgery     arthroscope  . Spine surgery 08/09    spinal decompression surgery  . Retinal detachment surgery 02/18/11    times 2  . Back surgery   . Colon surgery     due to punctured intestines  . Appendectomy   . Pars plana vitrectomy 07/31/2011    Procedure: PARS PLANA VITRECTOMY WITH 25 GAUGE;  Surgeon: [name], MD;  Location: [facility];  Service: Ophthalmology;  Laterality: Right;  REMOVAL OF SILICONE OIL AND LASER RIGHT EYE   History  Substance Use Topics  . Smoking status: Never Smoker   . Smokeless tobacco: Not on file  . Alcohol Use: No   Family History  Problem Relation Age of Onset  . Cancer Sister     brain tumor with hemmorhage  . Heart disease Sister     CAD  . COPD Brother    Allergies  Allergen Reactions  . Diclofenac Sodium     REACTION: angioedema  . Pioglitazone     REACTION: swelling   Current Outpatient Prescriptions on File Prior to Visit  Medication Sig Dispense Refill  . amLODipine (NORVASC) 5 MG tablet Take 10  mg by mouth daily. Kidney doctor Dr Holley Raring started 04/03/11.      Marland Kitchen aspirin 325 MG tablet Take 325 mg by mouth daily.       . calcium-vitamin D (OSCAL WITH D) 500-200 MG-UNIT per tablet Take 1 tablet by mouth daily.       . Cholecalciferol (VITAMIN D) 1000 UNITS capsule Take 1,000 Units by mouth daily.       . cyanocobalamin (,VITAMIN B-12,) 1000 MCG/ML injection Inject 1,000 mcg into the muscle every 8 (eight) weeks.       Marland Kitchen gemfibrozil (LOPID) 600 MG tablet Take 600 mg by mouth daily.        . hydrALAZINE (APRESOLINE) 50 MG tablet Take 50 mg by mouth 3 (three) times daily.        . insulin glargine (LANTUS) 100 UNIT/ML injection Inject 10 Units into the skin at bedtime. Or as directed.       . Insulin Pen Needle (B-D UF III MINI PEN NEEDLES) 31G X 5 MM MISC As directed with lancets  100 each  3  . metoprolol succinate (TOPROL-XL) 25 MG 24 hr tablet Take 12.5 mg by mouth daily. 1/2 tablet by mouth twice a day      . mirtazapine (REMERON) 15 MG tablet Take 0.5 tablets (7.5 mg total) by mouth at bedtime. 1/2 pill by mouth each bedtime  45 tablet  3  . Multiple Vitamin (MULTIVITAMIN) capsule Take 1 capsule by mouth daily.       Marland Kitchen oxybutynin (DITROPAN-XL) 10 MG 24 hr tablet Take 1 tablet (10 mg total) by mouth daily.  90 tablet  3  . sertraline (ZOLOFT) 50 MG tablet Take 1 tablet (50 mg total) by mouth daily.  90 tablet  3  . trandolapril (MAVIK) 4 MG tablet Take 1 tablet (4 mg total) by mouth daily.  90 tablet  3  . ALPRAZolam (XANAX) 0.5 MG tablet Take 1 tablet (0.5 mg total) by mouth at bedtime as needed for anxiety.  30 tablet  3  . gatifloxacin (ZYMAXID) 0.5 % SOLN Place 1 drop into the right eye 4 (four) times daily.            Review of Systems Review of Systems  Constitutional: Negative for fever, appetite change, fatigue and unexpected  weight change.  Eyes: Negative for pain and visual disturbance.  Respiratory: Negative for cough and shortness of breath.   Cardiovascular: Negative  for cp or palpitations    Gastrointestinal: Negative for nausea, diarrhea and constipation.  Genitourinary: Negative for urgency and frequency.  Skin: Negative for pallor or rash   Neurological: Negative for , light-headedness, numbness and headaches.  Hematological: Negative for adenopathy. Does not bruise/bleed easily.  Psychiatric/Behavioral: Negative for dysphoric mood. The patient is not nervous/anxious.         Objective:   Physical Exam  Constitutional: She appears well-developed and well-nourished. No distress.       overwt and well appearing   HENT:  Head: Normocephalic and atraumatic.  Mouth/Throat: Oropharynx is clear and moist.  Eyes: Conjunctivae and EOM are normal. Pupils are equal, round, and reactive to light. No scleral icterus.  Neck: Normal range of motion. Neck supple. No JVD present. Carotid bruit is not present. No thyromegaly present.  Cardiovascular: Normal rate, regular rhythm, normal heart sounds and intact distal pulses.  Exam reveals no gallop.   Pulmonary/Chest: Effort normal and breath sounds normal. No respiratory distress. She has no wheezes.  Abdominal: Soft. Bowel sounds are normal. She exhibits no distension, no abdominal bruit and no mass. There is no tenderness.  Musculoskeletal: She exhibits no edema and no tenderness.  Lymphadenopathy:    She has no cervical adenopathy.  Neurological: She is alert. She has normal reflexes. Coordination abnormal.       Baseline L hemiparesis with some improvement - especially in facial droop and speech  Skin: Skin is warm and dry. No rash noted. No erythema. No pallor.  Psychiatric: She has a normal mood and affect.          Assessment & Plan:

## 2012-04-04 NOTE — Assessment & Plan Note (Signed)
Doing very well  Offered to let her stop her lantus- she feels better on it - and will watch closely for low sugars Disc imp of regular meals with protein

## 2012-04-04 NOTE — Assessment & Plan Note (Signed)
Stable- rev labs Disc goals for lipids and reasons to control them Rev labs with pt Rev low sat fat diet in detail F/u 6 mo

## 2012-04-04 NOTE — Assessment & Plan Note (Signed)
Seeing renal Better cr today  Anemia still present  Did rev labs with pt in detail

## 2012-04-04 NOTE — Assessment & Plan Note (Signed)
bp in fair control at this time  No changes needed  Disc lifstyle change with low sodium diet and exercise   Rev labs with pt No further stroke symptoms

## 2012-04-04 NOTE — Patient Instructions (Addendum)
Tetanus shot today  B12 shot today and we need to increase frequency to every month  Schedule nurse visit in 1 month for a B12 shot  Follow up with me in 6 months with labs prior  If you have low blood sugars please let me know

## 2012-04-09 ENCOUNTER — Telehealth: Payer: Self-pay | Admitting: *Deleted

## 2012-04-09 NOTE — Telephone Encounter (Signed)
Calling pt to see if she will be using [facility] for her diabetes supplies, pt just had a form filled out in April 2013 for diabetes supplies to [facility] ( scanned in chart) we need to know which company she will be going with.  .left message to have patient return my call on home answering machine, cell phone wasn't accepting calls at this time.

## 2012-04-10 NOTE — Telephone Encounter (Signed)
Renee [name] I left the form on your desk.

## 2012-04-10 NOTE — Telephone Encounter (Signed)
Pt said [facility] is no longer handling diabetic supplies for pt and pt is using [facility].

## 2012-04-10 NOTE — Telephone Encounter (Signed)
Not in my in basket

## 2012-04-16 ENCOUNTER — Telehealth: Payer: Self-pay

## 2012-04-16 NOTE — Telephone Encounter (Signed)
[place] request a new Doctor Order form filled out. Form is in your basket.

## 2012-04-16 NOTE — Telephone Encounter (Signed)
Done and in IN box 

## 2012-04-17 NOTE — Telephone Encounter (Signed)
I gave the form to [place] to fax.

## 2012-04-22 DIAGNOSIS — N3946 Mixed incontinence: Secondary | ICD-10-CM | POA: Diagnosis not present

## 2012-04-22 NOTE — Telephone Encounter (Signed)
Completed form faxed and sent for scanning.

## 2012-04-28 ENCOUNTER — Other Ambulatory Visit: Payer: Self-pay | Admitting: *Deleted

## 2012-04-28 MED ORDER — GEMFIBROZIL 600 MG PO TABS: 600.0000 mg | ORAL_TABLET | Freq: Every day | ORAL | Status: DC

## 2012-04-28 NOTE — Telephone Encounter (Signed)
Received faxed refill request from pharmacy. Refill sent to pharmacy electronically. 

## 2012-05-01 ENCOUNTER — Other Ambulatory Visit: Payer: Self-pay | Admitting: *Deleted

## 2012-05-01 MED ORDER — GEMFIBROZIL 600 MG PO TABS: 600.0000 mg | ORAL_TABLET | Freq: Every day | ORAL | Status: DC

## 2012-05-09 ENCOUNTER — Ambulatory Visit (INDEPENDENT_AMBULATORY_CARE_PROVIDER_SITE_OTHER): Payer: Medicare Other

## 2012-05-09 DIAGNOSIS — E538 Deficiency of other specified B group vitamins: Secondary | ICD-10-CM | POA: Diagnosis not present

## 2012-05-09 MED ORDER — CYANOCOBALAMIN 1000 MCG/ML IJ SOLN
1000.0000 ug | Freq: Once | INTRAMUSCULAR | Status: AC
  Administered 2012-05-09: 1000 ug via INTRAMUSCULAR

## 2012-06-10 DIAGNOSIS — I10 Essential (primary) hypertension: Secondary | ICD-10-CM | POA: Diagnosis not present

## 2012-06-10 DIAGNOSIS — N183 Chronic kidney disease, stage 3 unspecified: Secondary | ICD-10-CM | POA: Diagnosis not present

## 2012-06-11 ENCOUNTER — Ambulatory Visit (INDEPENDENT_AMBULATORY_CARE_PROVIDER_SITE_OTHER): Payer: Medicare Other | Admitting: *Deleted

## 2012-06-11 DIAGNOSIS — Z23 Encounter for immunization: Secondary | ICD-10-CM

## 2012-06-11 DIAGNOSIS — E538 Deficiency of other specified B group vitamins: Secondary | ICD-10-CM

## 2012-06-11 MED ORDER — CYANOCOBALAMIN 1000 MCG/ML IJ SOLN
1000.0000 ug | Freq: Once | INTRAMUSCULAR | Status: AC
  Administered 2012-06-11: 1000 ug via INTRAMUSCULAR

## 2012-07-16 ENCOUNTER — Other Ambulatory Visit: Payer: Self-pay | Admitting: *Deleted

## 2012-07-16 MED ORDER — TRANDOLAPRIL 4 MG PO TABS: 4.0000 mg | ORAL_TABLET | Freq: Every day | ORAL | Status: DC

## 2012-07-16 MED ORDER — SERTRALINE HCL 50 MG PO TABS: 50.0000 mg | ORAL_TABLET | Freq: Every day | ORAL | Status: DC

## 2012-07-18 ENCOUNTER — Ambulatory Visit (INDEPENDENT_AMBULATORY_CARE_PROVIDER_SITE_OTHER): Payer: Medicare Other | Admitting: *Deleted

## 2012-07-18 DIAGNOSIS — E538 Deficiency of other specified B group vitamins: Secondary | ICD-10-CM | POA: Diagnosis not present

## 2012-07-18 MED ORDER — CYANOCOBALAMIN 1000 MCG/ML IJ SOLN
1000.0000 ug | Freq: Once | INTRAMUSCULAR | Status: AC
  Administered 2012-07-18: 1000 ug via INTRAMUSCULAR

## 2012-07-22 ENCOUNTER — Ambulatory Visit (INDEPENDENT_AMBULATORY_CARE_PROVIDER_SITE_OTHER): Payer: Medicare Other | Admitting: Ophthalmology

## 2012-07-22 DIAGNOSIS — H35039 Hypertensive retinopathy, unspecified eye: Secondary | ICD-10-CM

## 2012-07-22 DIAGNOSIS — E1139 Type 2 diabetes mellitus with other diabetic ophthalmic complication: Secondary | ICD-10-CM | POA: Diagnosis not present

## 2012-07-22 DIAGNOSIS — I10 Essential (primary) hypertension: Secondary | ICD-10-CM

## 2012-07-22 DIAGNOSIS — H35379 Puckering of macula, unspecified eye: Secondary | ICD-10-CM | POA: Diagnosis not present

## 2012-07-22 DIAGNOSIS — H33009 Unspecified retinal detachment with retinal break, unspecified eye: Secondary | ICD-10-CM

## 2012-07-22 DIAGNOSIS — E11319 Type 2 diabetes mellitus with unspecified diabetic retinopathy without macular edema: Secondary | ICD-10-CM

## 2012-07-22 DIAGNOSIS — H3530 Unspecified macular degeneration: Secondary | ICD-10-CM

## 2012-07-22 DIAGNOSIS — E1165 Type 2 diabetes mellitus with hyperglycemia: Secondary | ICD-10-CM

## 2012-08-19 ENCOUNTER — Ambulatory Visit (INDEPENDENT_AMBULATORY_CARE_PROVIDER_SITE_OTHER): Payer: Medicare Other | Admitting: Ophthalmology

## 2012-09-04 ENCOUNTER — Emergency Department (HOSPITAL_COMMUNITY): Payer: Medicare Other

## 2012-09-04 ENCOUNTER — Encounter (HOSPITAL_COMMUNITY): Payer: Self-pay

## 2012-09-04 ENCOUNTER — Emergency Department (HOSPITAL_COMMUNITY)
Admission: EM | Admit: 2012-09-04 | Discharge: 2012-09-04 | Disposition: A | Discharge status: Home / Self Care | Payer: Medicare Other | Attending: Emergency Medicine | Admitting: Emergency Medicine

## 2012-09-04 DIAGNOSIS — M1990 Unspecified osteoarthritis, unspecified site: Secondary | ICD-10-CM | POA: Diagnosis not present

## 2012-09-04 DIAGNOSIS — E785 Hyperlipidemia, unspecified: Secondary | ICD-10-CM | POA: Insufficient documentation

## 2012-09-04 DIAGNOSIS — Z8739 Personal history of other diseases of the musculoskeletal system and connective tissue: Secondary | ICD-10-CM | POA: Insufficient documentation

## 2012-09-04 DIAGNOSIS — M899 Disorder of bone, unspecified: Secondary | ICD-10-CM | POA: Diagnosis not present

## 2012-09-04 DIAGNOSIS — Z7982 Long term (current) use of aspirin: Secondary | ICD-10-CM | POA: Diagnosis not present

## 2012-09-04 DIAGNOSIS — Z8709 Personal history of other diseases of the respiratory system: Secondary | ICD-10-CM | POA: Diagnosis not present

## 2012-09-04 DIAGNOSIS — R071 Chest pain on breathing: Secondary | ICD-10-CM | POA: Insufficient documentation

## 2012-09-04 DIAGNOSIS — Z87448 Personal history of other diseases of urinary system: Secondary | ICD-10-CM | POA: Diagnosis not present

## 2012-09-04 DIAGNOSIS — Z794 Long term (current) use of insulin: Secondary | ICD-10-CM | POA: Diagnosis not present

## 2012-09-04 DIAGNOSIS — Z79899 Other long term (current) drug therapy: Secondary | ICD-10-CM | POA: Insufficient documentation

## 2012-09-04 DIAGNOSIS — Z8669 Personal history of other diseases of the nervous system and sense organs: Secondary | ICD-10-CM | POA: Insufficient documentation

## 2012-09-04 DIAGNOSIS — E119 Type 2 diabetes mellitus without complications: Secondary | ICD-10-CM | POA: Diagnosis not present

## 2012-09-04 DIAGNOSIS — Z8679 Personal history of other diseases of the circulatory system: Secondary | ICD-10-CM | POA: Insufficient documentation

## 2012-09-04 DIAGNOSIS — Z8673 Personal history of transient ischemic attack (TIA), and cerebral infarction without residual deficits: Secondary | ICD-10-CM | POA: Insufficient documentation

## 2012-09-04 DIAGNOSIS — R079 Chest pain, unspecified: Secondary | ICD-10-CM | POA: Diagnosis not present

## 2012-09-04 DIAGNOSIS — R0789 Other chest pain: Secondary | ICD-10-CM

## 2012-09-04 DIAGNOSIS — I10 Essential (primary) hypertension: Secondary | ICD-10-CM | POA: Diagnosis not present

## 2012-09-04 LAB — CBC WITH DIFFERENTIAL/PLATELET
Basophils Absolute: 0 10*3/uL (ref 0.0–0.1)
Basophils Relative: 1 % (ref 0–1)
Eosinophils Absolute: 0.3 10*3/uL (ref 0.0–0.7)
Eosinophils Relative: 7 % — ABNORMAL HIGH (ref 0–5)
HCT: 30.3 % — ABNORMAL LOW (ref 36.0–46.0)
Hemoglobin: 9.9 g/dL — ABNORMAL LOW (ref 12.0–15.0)
Lymphocytes Relative: 23 % (ref 12–46)
Lymphs Abs: 1 10*3/uL (ref 0.7–4.0)
MCH: 26.7 pg (ref 26.0–34.0)
MCHC: 32.7 g/dL (ref 30.0–36.0)
MCV: 81.7 fL (ref 78.0–100.0)
Monocytes Absolute: 0.3 10*3/uL (ref 0.1–1.0)
Monocytes Relative: 8 % (ref 3–12)
Neutro Abs: 2.5 10*3/uL (ref 1.7–7.7)
Neutrophils Relative %: 61 % (ref 43–77)
Platelets: 168 10*3/uL (ref 150–400)
RBC: 3.71 MIL/uL — ABNORMAL LOW (ref 3.87–5.11)
RDW: 14.9 % (ref 11.5–15.5)
WBC: 4.1 10*3/uL (ref 4.0–10.5)

## 2012-09-04 LAB — POCT I-STAT TROPONIN I
Troponin i, poc: 0.01 ng/mL (ref 0.00–0.08)
Troponin i, poc: 0 ng/mL (ref 0.00–0.08)

## 2012-09-04 LAB — COMPREHENSIVE METABOLIC PANEL
ALT: 12 U/L (ref 0–35)
AST: 17 U/L (ref 0–37)
Albumin: 3.2 g/dL — ABNORMAL LOW (ref 3.5–5.2)
Alkaline Phosphatase: 76 U/L (ref 39–117)
BUN: 27 mg/dL — ABNORMAL HIGH (ref 6–23)
CO2: 29 mEq/L (ref 19–32)
Calcium: 9.2 mg/dL (ref 8.4–10.5)
Chloride: 105 mEq/L (ref 96–112)
Creatinine, Ser: 1.28 mg/dL — ABNORMAL HIGH (ref 0.50–1.10)
GFR calc Af Amer: 44 mL/min — ABNORMAL LOW (ref >90)
GFR calc non Af Amer: 38 mL/min — ABNORMAL LOW (ref >90)
Glucose, Bld: 117 mg/dL — ABNORMAL HIGH (ref 70–99)
Potassium: 4.4 mEq/L (ref 3.5–5.1)
Sodium: 141 mEq/L (ref 135–145)
Total Bilirubin: 0.2 mg/dL — ABNORMAL LOW (ref 0.3–1.2)
Total Protein: 5.8 g/dL — ABNORMAL LOW (ref 6.0–8.3)

## 2012-09-04 LAB — URINALYSIS, ROUTINE W REFLEX MICROSCOPIC
Bilirubin Urine: NEGATIVE
Glucose, UA: NEGATIVE mg/dL
Hgb urine dipstick: NEGATIVE
Ketones, ur: NEGATIVE mg/dL
Leukocytes, UA: NEGATIVE
Nitrite: NEGATIVE
Protein, ur: NEGATIVE mg/dL
Specific Gravity, Urine: 1.018 (ref 1.005–1.030)
Urobilinogen, UA: 0.2 mg/dL (ref 0.0–1.0)
pH: 6 (ref 5.0–8.0)

## 2012-09-04 NOTE — ED Notes (Signed)
Pt has not taken any of her morning medications, she usually takes them around 9am.

## 2012-09-04 NOTE — ED Notes (Signed)
Patient transported to X-ray 

## 2012-09-04 NOTE — ED Notes (Signed)
Pt returned from xray

## 2012-09-04 NOTE — ED Provider Notes (Signed)
History     CSN: [id number]  Arrival date & time 09/04/12  1057   First MD Initiated Contact with Patient 09/04/12 1103      Chief Complaint  Patient presents with  . Chest Pain    (Consider location/radiation/quality/duration/timing/severity/associated sxs/prior treatment) HPI Pt states she woke this morning with R sided chest pain. No radiation, SOB, fever chills, trauma. Pain is improved but exacerbated with palpation. No recent travel or surgery. No lower ext swelling or pain. No prev cardiac history.  Past Medical History  Diagnosis Date  . Sleep apnea   . Diabetes mellitus     type II  . Hypertension   . Hyperlipidemia   . Stroke     Small vessel sobcortical (in [place] trial) with Dr Leonie Man  . Osteoarthritis   . Angioedema     possibly from voltaren  . Vitamin B 12 deficiency 04/08  . LVH (left ventricular hypertrophy)     and atrial enlargement by echo in past with nl EF  . Degenerative disc disease   . Osteopenia   . Nasal pruritis   . Renal insufficiency     Past Surgical History  Procedure Date  . Eye surgery     cataract extraction  . Abdominal hysterectomy     BSO-fibroids  . Knee surgery     arthroscope  . Spine surgery 08/09    spinal decompression surgery  . Retinal detachment surgery 02/18/11    times 2  . Back surgery   . Colon surgery     due to punctured intestines  . Appendectomy   . Pars plana vitrectomy 07/31/2011    Procedure: PARS PLANA VITRECTOMY WITH 25 GAUGE;  Surgeon: [name], MD;  Location: [place];  Service: Ophthalmology;  Laterality: Right;  REMOVAL OF SILICONE OIL AND LASER RIGHT EYE    Family History  Problem Relation Age of Onset  . Cancer Sister     brain tumor with hemmorhage  . Heart disease Sister     CAD  . COPD Brother     History  Substance Use Topics  . Smoking status: Never Smoker   . Smokeless tobacco: Not on file  . Alcohol Use: No    OB History    Grav Para Term Preterm Abortions TAB SAB Ect  Mult Living                  Review of Systems  Constitutional: Negative for fever and chills.  HENT: Negative for neck pain.   Respiratory: Negative for cough, shortness of breath and wheezing.   Cardiovascular: Positive for chest pain. Negative for palpitations and leg swelling.  Gastrointestinal: Negative for nausea, vomiting and abdominal pain.  Genitourinary: Negative for dysuria.  Musculoskeletal: Negative for myalgias and back pain.  Skin: Negative for rash and wound.  Neurological: Negative for dizziness, syncope, weakness, light-headedness, numbness and headaches.    Allergies  Diclofenac sodium and Pioglitazone  Home Medications   Current Outpatient Rx  Name  Route  Sig  Dispense  Refill  . AMLODIPINE BESYLATE 10 MG PO TABS   Oral   Take 10 mg by mouth daily.         . ASPIRIN 325 MG PO TABS   Oral   Take 325 mg by mouth daily.          Marland Kitchen CALCIUM CARBONATE-VITAMIN D 500-200 MG-UNIT PO TABS   Oral   Take 1 tablet by mouth daily.          Marland Kitchen  VITAMIN D 1000 UNITS PO CAPS   Oral   Take 1,000 Units by mouth daily.          . CYANOCOBALAMIN 1000 MCG/ML IJ SOLN   Intramuscular   Inject 1,000 mcg into the muscle every 30 (thirty) days.          Marland Kitchen GEMFIBROZIL 600 MG PO TABS   Oral   Take 1 tablet (600 mg total) by mouth daily.   90 tablet   3   . HYDRALAZINE HCL 50 MG PO TABS   Oral   Take 50 mg by mouth 3 (three) times daily.           Marland Kitchen METOPROLOL SUCCINATE ER 25 MG PO TB24   Oral   Take 12.5 mg by mouth daily. 1/2 tablet by mouth twice a day         . MIRTAZAPINE 15 MG PO TABS   Oral   Take 0.5 tablets (7.5 mg total) by mouth at bedtime. 1/2 pill by mouth each bedtime   45 tablet   3   . MULTIVITAMINS PO CAPS   Oral   Take 1 capsule by mouth daily.          . SERTRALINE HCL 50 MG PO TABS   Oral   Take 1 tablet (50 mg total) by mouth daily.   90 tablet   0   . TRANDOLAPRIL 4 MG PO TABS   Oral   Take 1 tablet (4 mg total) by  mouth daily.   90 tablet   1   . INSULIN PEN NEEDLE 31G X 5 MM MISC      As directed with lancets   100 each   3     BP 170/60  Pulse 61  Temp 97.8 F (36.6 C) (Oral)  Resp 18  SpO2 100%  Physical Exam  Nursing note and vitals reviewed. Constitutional: She is oriented to person, place, and time. She appears well-developed and well-nourished. No distress.  HENT:  Head: Normocephalic and atraumatic.  Mouth/Throat: Oropharynx is clear and moist.  Eyes: EOM are normal. Pupils are equal, round, and reactive to light.  Neck: Normal range of motion. Neck supple.  Cardiovascular: Normal rate and regular rhythm.   Pulmonary/Chest: Effort normal and breath sounds normal. No respiratory distress. She has no wheezes. She has no rales. She exhibits tenderness (Reproduced chest wall tenderness with palpation of R costal cartilage. ).  Abdominal: Soft. Bowel sounds are normal. She exhibits no distension and no mass. There is no tenderness. There is no rebound and no guarding.  Musculoskeletal: Normal range of motion. She exhibits no edema and no tenderness.       No lower ext swelling or pain.   Neurological: She is alert and oriented to person, place, and time.  Skin: Skin is warm and dry. No rash noted. No erythema.  Psychiatric: She has a normal mood and affect. Her behavior is normal.    ED Course  Procedures (including critical care time)  Labs Reviewed  CBC WITH DIFFERENTIAL - Abnormal; Notable for the following:    RBC 3.71 (*)     Hemoglobin 9.9 (*)     HCT 30.3 (*)     Eosinophils Relative 7 (*)     All other components within normal limits  COMPREHENSIVE METABOLIC PANEL - Abnormal; Notable for the following:    Glucose, Bld 117 (*)     BUN 27 (*)     Creatinine, Ser 1.28 (*)  Total Protein 5.8 (*)     Albumin 3.2 (*)     Total Bilirubin 0.2 (*)     GFR calc non Af Amer 38 (*)     GFR calc Af Amer 44 (*)     All other components within normal limits  URINALYSIS,  ROUTINE W REFLEX MICROSCOPIC  POCT I-STAT TROPONIN I  POCT I-STAT TROPONIN I   Dg Chest 2 View  09/04/2012  *RADIOLOGY REPORT*  Clinical Data: Chest pain, weakness  CHEST - 2 VIEW  Comparison: 01/11/2011; 03/30/2008  Findings: Grossly unchanged enlarged cardiac silhouette with mild tortuosity of the thoracic aorta.  Thickening of the right paratracheal stripe is stable since 2009 examination and presumably vascular in etiology.  There is chronic mild elevation/eventration of the right hemidiaphragm.  No focal airspace opacity.  No pleural effusion or pneumothorax.  No definite evidence of pulmonary edema. Unchanged bones.  IMPRESSION: Stable enlarged cardiac silhouette without acute cardiopulmonary disease.   Original Report Authenticated By: Jake Seats, MD      1. Chest wall pain      Date: 09/04/2012  Rate: 61  Rhythm: normal sinus rhythm  QRS Axis: normal  Intervals: normal  ST/T Wave abnormalities: normal  Conduction Disutrbances:none  Narrative Interpretation:   Old EKG Reviewed: unchanged Artifact in II, II without obvious changes from prev EKG   MDM  Symptoms consistent with chest wall pain. Trop x 2 neg. Normal EKG. F/u with pmd or return for worsening symptoms.         Julianne Rice, MD 09/04/12 1505

## 2012-09-04 NOTE — ED Notes (Signed)
Dr Yelverton at bedside.  

## 2012-09-04 NOTE — ED Notes (Signed)
Pt states she started having central chest pain yesterday that was sharp in nature, with no radiation, and no other symptoms and then it went away. Today she began having the pain again around 7am but this time is was more dull, and lasted a few hours so she called EMS. Pt denies any pain at this time.

## 2012-09-04 NOTE — ED Notes (Addendum)
EMS reports pt with right sided CP yesterday under right breast with vague description, no symptoms related to the pain, woke up with the same pain today, CBG 112/ BP 170/90

## 2012-09-22 ENCOUNTER — Other Ambulatory Visit: Payer: Self-pay | Admitting: *Deleted

## 2012-09-22 MED ORDER — SERTRALINE HCL 50 MG PO TABS: 50.0000 mg | ORAL_TABLET | Freq: Every day | ORAL | Status: DC

## 2012-09-22 NOTE — Telephone Encounter (Signed)
Received fax refill request from mail order pharm. Ok to refill?

## 2012-09-22 NOTE — Telephone Encounter (Signed)
Please give her 6 months of refils, thanks

## 2012-09-22 NOTE — Telephone Encounter (Signed)
done

## 2012-09-24 ENCOUNTER — Telehealth: Payer: Self-pay

## 2012-09-24 NOTE — Telephone Encounter (Signed)
Pt left v/m requesting status of refill of Sertraline. Advised pt refilled [facility] on 09/22/12. Pt will ck with pharmacy.

## 2012-09-30 ENCOUNTER — Telehealth: Payer: Self-pay | Admitting: Family Medicine

## 2012-09-30 DIAGNOSIS — N183 Chronic kidney disease, stage 3 unspecified: Secondary | ICD-10-CM | POA: Diagnosis not present

## 2012-09-30 DIAGNOSIS — E119 Type 2 diabetes mellitus without complications: Secondary | ICD-10-CM

## 2012-09-30 DIAGNOSIS — E785 Hyperlipidemia, unspecified: Secondary | ICD-10-CM

## 2012-09-30 DIAGNOSIS — I10 Essential (primary) hypertension: Secondary | ICD-10-CM

## 2012-09-30 DIAGNOSIS — E7800 Pure hypercholesterolemia, unspecified: Secondary | ICD-10-CM | POA: Diagnosis not present

## 2012-09-30 DIAGNOSIS — D631 Anemia in chronic kidney disease: Secondary | ICD-10-CM | POA: Diagnosis not present

## 2012-09-30 DIAGNOSIS — E538 Deficiency of other specified B group vitamins: Secondary | ICD-10-CM

## 2012-09-30 NOTE — Telephone Encounter (Signed)
Message copied by Abner Greenspan on Tue Sep 30, 2012  5:44 PM ------      Message from: Ellamae Sia      Created: Tue Sep 23, 2012  3:26 PM      Regarding: lab orders for Wednesday, 2.12.14       Labs for a 6 month f/u ------

## 2012-10-01 ENCOUNTER — Other Ambulatory Visit: Payer: Medicare Other

## 2012-10-06 ENCOUNTER — Encounter: Payer: Self-pay | Admitting: Family Medicine

## 2012-10-06 ENCOUNTER — Ambulatory Visit (INDEPENDENT_AMBULATORY_CARE_PROVIDER_SITE_OTHER): Payer: Medicare Other | Admitting: Family Medicine

## 2012-10-06 VITALS — BP 142/86 | HR 62 | Temp 97.7°F | Ht 64.0 in | Wt 168.2 lb

## 2012-10-06 DIAGNOSIS — I10 Essential (primary) hypertension: Secondary | ICD-10-CM | POA: Diagnosis not present

## 2012-10-06 DIAGNOSIS — E538 Deficiency of other specified B group vitamins: Secondary | ICD-10-CM | POA: Diagnosis not present

## 2012-10-06 DIAGNOSIS — N259 Disorder resulting from impaired renal tubular function, unspecified: Secondary | ICD-10-CM | POA: Diagnosis not present

## 2012-10-06 DIAGNOSIS — E119 Type 2 diabetes mellitus without complications: Secondary | ICD-10-CM

## 2012-10-06 MED ORDER — CYANOCOBALAMIN 1000 MCG/ML IJ SOLN
1000.0000 ug | Freq: Once | INTRAMUSCULAR | Status: AC
  Administered 2012-10-06: 1000 ug via INTRAMUSCULAR

## 2012-10-06 NOTE — Assessment & Plan Note (Signed)
Sent for last labs Anemia had imp with 10.7 HB (renal related/ chronic dz)

## 2012-10-06 NOTE — Assessment & Plan Note (Signed)
Pt missed jan shot Given today Will hold off on labs due to missed doses Stressed imp of monthly dosing

## 2012-10-06 NOTE — Progress Notes (Signed)
Subjective:    Patient ID: Renee [name], female    DOB: 1929/05/21, 79 y.o.   MRN: [id number]  HPI Here for f/u of chronic health problems  Had an ER visit for CP in jan -and was told it was muscular  Her bp went up transiently    Asked her nephrologist to check her labs when there last week - I have not received labs from them yet   Renal insuff- sees renal doctor   Chemistry      Component Value Date/Time   NA 141 09/04/2012 1136   K 4.4 09/04/2012 1136   CL 105 09/04/2012 1136   CO2 29 09/04/2012 1136   BUN 27* 09/04/2012 1136   CREATININE 1.28* 09/04/2012 1136      Component Value Date/Time   CALCIUM 9.2 09/04/2012 1136   ALKPHOS 76 09/04/2012 1136   AST 17 09/04/2012 1136   ALT 12 09/04/2012 1136   BILITOT 0.2* 09/04/2012 1136     has anemia related Lab Results  Component Value Date   WBC 4.1 09/04/2012   HGB 9.9* 09/04/2012   HCT 30.3* 09/04/2012   MCV 81.7 09/04/2012   PLT 168 09/04/2012     B12 def Shots monthly Lab Results  Component Value Date   VITAMINB12 169* 03/28/2012    DM- pt did stop her insulin as we discussed- did not think she needed it  Sugars have all been all been under 120  Lab Results  Component Value Date   HGBA1C 5.6 03/28/2012   wt is down 6 lb  She is eating enough (per pt)- is eating much better , appetitie is improved and food tastes better to her  She has a fear of gaining weight back- she feels better lighter!  bp is stable today  No cp or palpitations or headaches or edema  No side effects to medicines  BP Readings from Last 3 Encounters:  10/06/12 142/86  09/04/12 170/60  04/04/12 130/68      Patient Active Problem List  Diagnosis  . POSTHERPETIC NEURALGIA  . DIABETES MELLITUS, TYPE II  . B12 DEFICIENCY  . HYPERLIPIDEMIA  . HYPERTENSION  . CVA WITH LEFT HEMIPARESIS  . RENAL INSUFFICIENCY  . FIBROCYSTIC BREAST DISEASE  . ROSACEA  . OSTEOARTHRITIS  . BACK PAIN  . MUSCLE WEAKNESS (GENERALIZED)  . OSTEOPENIA  . EDEMA  .  URINARY INCONTINENCE, MIXED  . ANGIOEDEMA  . Depression with anxiety  . Sleep apnea  . Retinal detachment  . Mobility impaired   Past Medical History  Diagnosis Date  . Sleep apnea   . Diabetes mellitus     type II  . Hypertension   . Hyperlipidemia   . Stroke     Small vessel sobcortical (in [place] trial) with Dr Leonie Man  . Osteoarthritis   . Angioedema     possibly from voltaren  . Vitamin B 12 deficiency 04/08  . LVH (left ventricular hypertrophy)     and atrial enlargement by echo in past with nl EF  . Degenerative disc disease   . Osteopenia   . Nasal pruritis   . Renal insufficiency    Past Surgical History  Procedure Laterality Date  . Eye surgery      cataract extraction  . Abdominal hysterectomy      BSO-fibroids  . Knee surgery      arthroscope  . Spine surgery  08/09    spinal decompression surgery  . Retinal detachment surgery  02/18/11  times 2  . Back surgery    . Colon surgery      due to punctured intestines  . Appendectomy    . Pars plana vitrectomy  07/31/2011    Procedure: PARS PLANA VITRECTOMY WITH 25 GAUGE;  Surgeon: [name], MD;  Location: [place];  Service: Ophthalmology;  Laterality: Right;  REMOVAL OF SILICONE OIL AND LASER RIGHT EYE   History  Substance Use Topics  . Smoking status: Never Smoker   . Smokeless tobacco: Not on file  . Alcohol Use: No   Family History  Problem Relation Age of Onset  . Cancer Sister     brain tumor with hemmorhage  . Heart disease Sister     CAD  . COPD Brother    Allergies  Allergen Reactions  . Diclofenac Sodium     REACTION: angioedema  . Pioglitazone     REACTION: swelling   Current Outpatient Prescriptions on File Prior to Visit  Medication Sig Dispense Refill  . amLODipine (NORVASC) 10 MG tablet Take 10 mg by mouth daily.      Marland Kitchen aspirin 325 MG tablet Take 325 mg by mouth daily.       . calcium-vitamin D (OSCAL WITH D) 500-200 MG-UNIT per tablet Take 1 tablet by mouth daily.       .  Cholecalciferol (VITAMIN D) 1000 UNITS capsule Take 1,000 Units by mouth daily.       . cyanocobalamin (,VITAMIN B-12,) 1000 MCG/ML injection Inject 1,000 mcg into the muscle every 30 (thirty) days.       Marland Kitchen gemfibrozil (LOPID) 600 MG tablet Take 1 tablet (600 mg total) by mouth daily.  90 tablet  3  . hydrALAZINE (APRESOLINE) 50 MG tablet Take 50 mg by mouth 3 (three) times daily.        . Insulin Pen Needle (B-D UF III MINI PEN NEEDLES) 31G X 5 MM MISC As directed with lancets  100 each  3  . metoprolol succinate (TOPROL-XL) 25 MG 24 hr tablet Take 12.5 mg by mouth daily. 1/2 tablet by mouth twice a day      . mirtazapine (REMERON) 15 MG tablet Take 0.5 tablets (7.5 mg total) by mouth at bedtime. 1/2 pill by mouth each bedtime  45 tablet  3  . Multiple Vitamin (MULTIVITAMIN) capsule Take 1 capsule by mouth daily.       . sertraline (ZOLOFT) 50 MG tablet Take 1 tablet (50 mg total) by mouth daily.  90 tablet  1  . trandolapril (MAVIK) 4 MG tablet Take 1 tablet (4 mg total) by mouth daily.  90 tablet  1   No current facility-administered medications on file prior to visit.    Review of Systems    Review of Systems  Constitutional: Negative for fever, appetite change, fatigue and unexpected weight change.  Eyes: Negative for pain and visual disturbance.  Respiratory: Negative for cough and shortness of breath.   Cardiovascular: Negative for cp or palpitations    Gastrointestinal: Negative for nausea, diarrhea and constipation.  Genitourinary: Negative for urgency and frequency.  Skin: Negative for pallor or rash   Neurological: Negative for  light-headedness, numbness and headaches.  Hematological: Negative for adenopathy. Does not bruise/bleed easily.  Psychiatric/Behavioral: Negative for dysphoric mood. The patient is not nervous/anxious.      Objective:   Physical Exam  Constitutional: She appears well-developed and well-nourished. No distress.  HENT:  Head: Normocephalic and  atraumatic.  Mouth/Throat: Oropharynx is clear and moist.  Eyes: Conjunctivae and EOM are normal. Pupils are equal, round, and reactive to light. Right eye exhibits no discharge. Left eye exhibits no discharge. No scleral icterus.  Neck: Normal range of motion. Neck supple. No JVD present. Carotid bruit is not present. No thyromegaly present.  Cardiovascular: Normal rate, regular rhythm, normal heart sounds and intact distal pulses.  Exam reveals no gallop.   Pulmonary/Chest: Effort normal and breath sounds normal. No respiratory distress. She has no wheezes.  Abdominal: Soft. Bowel sounds are normal. She exhibits no distension, no abdominal bruit and no mass. There is no tenderness.  Musculoskeletal: She exhibits no edema and no tenderness.  Lymphadenopathy:    She has no cervical adenopathy.  Neurological: She is alert. She has normal reflexes.  Baseline hemiparesis with fair mobility  Skin: Skin is warm and dry. No rash noted. No pallor.  Psychiatric: She has a normal mood and affect.          Assessment & Plan:

## 2012-10-06 NOTE — Assessment & Plan Note (Signed)
bp in fair control at this time  No changes needed  Disc lifstyle change with low sodium diet and exercise   

## 2012-10-06 NOTE — Assessment & Plan Note (Signed)
No meds at this time May be able to downgrade to hyperglycemia Good home sugars  Wt loss has helped much  Will send to nephrologist for last labs (per pt did her a1c)

## 2012-10-06 NOTE — Patient Instructions (Addendum)
B12 shot today  Please send to nephrologist office for last lab results  It sounds like sugars have been well controlled  Schedule next nurse visit for B12 shot in 1 month  Follow up in 6 months for annual exam with labs prior

## 2012-10-27 ENCOUNTER — Encounter: Payer: Self-pay | Admitting: Family Medicine

## 2012-11-04 ENCOUNTER — Ambulatory Visit: Payer: Medicare Other

## 2012-11-07 ENCOUNTER — Ambulatory Visit (INDEPENDENT_AMBULATORY_CARE_PROVIDER_SITE_OTHER): Payer: Medicare Other | Admitting: Family Medicine

## 2012-11-07 DIAGNOSIS — D649 Anemia, unspecified: Secondary | ICD-10-CM | POA: Diagnosis not present

## 2012-11-07 DIAGNOSIS — E538 Deficiency of other specified B group vitamins: Secondary | ICD-10-CM

## 2012-11-07 MED ORDER — CYANOCOBALAMIN 1000 MCG/ML IJ SOLN
1000.0000 ug | Freq: Once | INTRAMUSCULAR | Status: AC
  Administered 2012-11-07: 1000 ug via INTRAMUSCULAR

## 2012-12-11 ENCOUNTER — Other Ambulatory Visit: Payer: Self-pay | Admitting: *Deleted

## 2012-12-11 MED ORDER — MIRTAZAPINE 15 MG PO TABS: 7.5000 mg | ORAL_TABLET | Freq: Every day | ORAL | Status: DC

## 2012-12-11 MED ORDER — METOPROLOL SUCCINATE ER 25 MG PO TB24: 12.5000 mg | ORAL_TABLET | Freq: Two times a day (BID) | ORAL | Status: DC

## 2012-12-11 NOTE — Telephone Encounter (Signed)
Please give 6 mo of refils

## 2012-12-11 NOTE — Telephone Encounter (Signed)
done

## 2012-12-23 ENCOUNTER — Telehealth: Payer: Self-pay

## 2012-12-23 NOTE — Telephone Encounter (Signed)
Pt called to schedule Vit B 12 injection; advised pt national shortage on Vit B 12 and would pt consider taking Vit B 12 by mouth. Pt said she will do whatever Dr Glori Bickers recommends. Pt request call back.

## 2012-12-23 NOTE — Telephone Encounter (Signed)
Instruct her to get vitamin B12 over the counter and take 1000 mcg once daily - until we get the injections back

## 2012-12-24 NOTE — Telephone Encounter (Signed)
Left message with pt's caregiver to have her start taking b12 OTC

## 2013-01-19 DIAGNOSIS — N039 Chronic nephritic syndrome with unspecified morphologic changes: Secondary | ICD-10-CM | POA: Diagnosis not present

## 2013-01-19 DIAGNOSIS — N183 Chronic kidney disease, stage 3 unspecified: Secondary | ICD-10-CM | POA: Diagnosis not present

## 2013-01-19 DIAGNOSIS — I10 Essential (primary) hypertension: Secondary | ICD-10-CM | POA: Diagnosis not present

## 2013-01-19 DIAGNOSIS — D631 Anemia in chronic kidney disease: Secondary | ICD-10-CM | POA: Diagnosis not present

## 2013-01-22 ENCOUNTER — Ambulatory Visit: Payer: Self-pay | Admitting: Internal Medicine

## 2013-01-22 DIAGNOSIS — E119 Type 2 diabetes mellitus without complications: Secondary | ICD-10-CM | POA: Diagnosis not present

## 2013-01-22 DIAGNOSIS — Z7982 Long term (current) use of aspirin: Secondary | ICD-10-CM | POA: Diagnosis not present

## 2013-01-22 DIAGNOSIS — Z90710 Acquired absence of both cervix and uterus: Secondary | ICD-10-CM | POA: Diagnosis not present

## 2013-01-22 DIAGNOSIS — F3289 Other specified depressive episodes: Secondary | ICD-10-CM | POA: Diagnosis not present

## 2013-01-22 DIAGNOSIS — R5381 Other malaise: Secondary | ICD-10-CM | POA: Diagnosis not present

## 2013-01-22 DIAGNOSIS — F329 Major depressive disorder, single episode, unspecified: Secondary | ICD-10-CM | POA: Diagnosis not present

## 2013-01-22 DIAGNOSIS — I69998 Other sequelae following unspecified cerebrovascular disease: Secondary | ICD-10-CM | POA: Diagnosis not present

## 2013-01-22 DIAGNOSIS — Z8673 Personal history of transient ischemic attack (TIA), and cerebral infarction without residual deficits: Secondary | ICD-10-CM | POA: Diagnosis not present

## 2013-01-22 DIAGNOSIS — R231 Pallor: Secondary | ICD-10-CM | POA: Diagnosis not present

## 2013-01-22 DIAGNOSIS — D509 Iron deficiency anemia, unspecified: Secondary | ICD-10-CM | POA: Diagnosis not present

## 2013-01-22 DIAGNOSIS — E538 Deficiency of other specified B group vitamins: Secondary | ICD-10-CM | POA: Diagnosis not present

## 2013-01-22 DIAGNOSIS — G4730 Sleep apnea, unspecified: Secondary | ICD-10-CM | POA: Diagnosis not present

## 2013-01-22 DIAGNOSIS — Z79899 Other long term (current) drug therapy: Secondary | ICD-10-CM | POA: Diagnosis not present

## 2013-01-29 ENCOUNTER — Ambulatory Visit: Payer: Self-pay | Admitting: Internal Medicine

## 2013-01-29 LAB — CBC CANCER CENTER
Basophil #: 0.1 x10 3/mm (ref 0.0–0.1)
Basophil %: 1.3 %
Eosinophil #: 0.3 x10 3/mm (ref 0.0–0.7)
Eosinophil %: 5.3 %
HCT: 32.8 % — ABNORMAL LOW (ref 35.0–47.0)
HGB: 10.5 g/dL — ABNORMAL LOW (ref 12.0–16.0)
Lymphocyte #: 0.8 x10 3/mm — ABNORMAL LOW (ref 1.0–3.6)
Lymphocyte %: 16.5 %
MCH: 24.6 pg — ABNORMAL LOW (ref 26.0–34.0)
MCHC: 32.1 g/dL (ref 32.0–36.0)
MCV: 77 fL — ABNORMAL LOW (ref 80–100)
Monocyte #: 0.3 x10 3/mm (ref 0.2–0.9)
Monocyte %: 6.5 %
Neutrophil #: 3.4 x10 3/mm (ref 1.4–6.5)
Neutrophil %: 70.4 %
Platelet: 182 x10 3/mm (ref 150–440)
RBC: 4.27 10*6/uL (ref 3.80–5.20)
RDW: 17.2 % — ABNORMAL HIGH (ref 11.5–14.5)
WBC: 4.8 x10 3/mm (ref 3.6–11.0)

## 2013-01-29 LAB — FERRITIN: Ferritin (ARMC): 7 ng/mL — ABNORMAL LOW (ref 8–388)

## 2013-02-04 DIAGNOSIS — D500 Iron deficiency anemia secondary to blood loss (chronic): Secondary | ICD-10-CM | POA: Diagnosis not present

## 2013-02-04 DIAGNOSIS — Z86010 Personal history of colonic polyps: Secondary | ICD-10-CM | POA: Diagnosis not present

## 2013-02-12 ENCOUNTER — Other Ambulatory Visit: Payer: Self-pay | Admitting: Family Medicine

## 2013-02-12 MED ORDER — TRANDOLAPRIL 4 MG PO TABS: 4.0000 mg | ORAL_TABLET | Freq: Every day | ORAL | Status: DC

## 2013-02-17 ENCOUNTER — Ambulatory Visit: Payer: Self-pay | Admitting: Internal Medicine

## 2013-02-17 DIAGNOSIS — F329 Major depressive disorder, single episode, unspecified: Secondary | ICD-10-CM | POA: Diagnosis not present

## 2013-02-17 DIAGNOSIS — F3289 Other specified depressive episodes: Secondary | ICD-10-CM | POA: Diagnosis not present

## 2013-02-17 DIAGNOSIS — E538 Deficiency of other specified B group vitamins: Secondary | ICD-10-CM | POA: Diagnosis not present

## 2013-02-17 DIAGNOSIS — D509 Iron deficiency anemia, unspecified: Secondary | ICD-10-CM | POA: Diagnosis not present

## 2013-02-17 DIAGNOSIS — Z79899 Other long term (current) drug therapy: Secondary | ICD-10-CM | POA: Diagnosis not present

## 2013-02-17 DIAGNOSIS — E119 Type 2 diabetes mellitus without complications: Secondary | ICD-10-CM | POA: Diagnosis not present

## 2013-02-17 DIAGNOSIS — Z8673 Personal history of transient ischemic attack (TIA), and cerebral infarction without residual deficits: Secondary | ICD-10-CM | POA: Diagnosis not present

## 2013-02-17 DIAGNOSIS — Z7982 Long term (current) use of aspirin: Secondary | ICD-10-CM | POA: Diagnosis not present

## 2013-02-17 DIAGNOSIS — I10 Essential (primary) hypertension: Secondary | ICD-10-CM | POA: Diagnosis not present

## 2013-02-24 ENCOUNTER — Encounter: Payer: Self-pay | Admitting: Family Medicine

## 2013-02-24 ENCOUNTER — Ambulatory Visit (INDEPENDENT_AMBULATORY_CARE_PROVIDER_SITE_OTHER): Payer: Medicare Other | Admitting: Family Medicine

## 2013-02-24 VITALS — BP 140/70 | HR 60 | Temp 98.0°F | Ht 64.0 in | Wt 175.0 lb

## 2013-02-24 DIAGNOSIS — E538 Deficiency of other specified B group vitamins: Secondary | ICD-10-CM | POA: Diagnosis not present

## 2013-02-24 DIAGNOSIS — R109 Unspecified abdominal pain: Secondary | ICD-10-CM

## 2013-02-24 DIAGNOSIS — N390 Urinary tract infection, site not specified: Secondary | ICD-10-CM | POA: Diagnosis not present

## 2013-02-24 LAB — POCT URINALYSIS DIPSTICK
Bilirubin, UA: NEGATIVE
Glucose, UA: NEGATIVE
Ketones, UA: NEGATIVE
Nitrite, UA: NEGATIVE
Spec Grav, UA: 1.015
Urobilinogen, UA: 0.2
pH, UA: 6

## 2013-02-24 MED ORDER — CYANOCOBALAMIN 1000 MCG/ML IJ SOLN
1000.0000 ug | Freq: Once | INTRAMUSCULAR | Status: AC
  Administered 2013-02-24: 1000 ug via INTRAMUSCULAR

## 2013-02-24 MED ORDER — CIPROFLOXACIN HCL 250 MG PO TABS: 250.0000 mg | ORAL_TABLET | Freq: Two times a day (BID) | ORAL | Status: DC

## 2013-02-24 NOTE — Assessment & Plan Note (Signed)
With mild flank pain  tx with renally dosed cipro in light of renal insuff Disc imp of good water intake ucx ordered Update if not starting to improve in a week or if worsening

## 2013-02-24 NOTE — Assessment & Plan Note (Signed)
Shot today since these are back in stock

## 2013-02-24 NOTE — Progress Notes (Signed)
Subjective:    Patient ID: Renee [name], female    DOB: 10/17/1928, 79 y.o.   MRN: [id number]  HPI Here with urinary symptoms  Intermittent burning on urination since Thursday More frequency and urgency No blood in urine and no n/v A little pain in R side - lateral to her breast  No rash noted    Not drinking enough water- knows this Lab Results  Component Value Date   CREATININE 1.28* 09/04/2012   BUN 27* 09/04/2012   NA 141 09/04/2012   K 4.4 09/04/2012   CL 105 09/04/2012   CO2 29 09/04/2012    She sees nephrologist  Is anemic Lab Results  Component Value Date   WBC 4.1 09/04/2012   HGB 9.9* 09/04/2012   HCT 30.3* 09/04/2012   MCV 81.7 09/04/2012   PLT 168 09/04/2012   saw heme and also GI Stool cards   Lab Results  Component Value Date   VITAMINB12 169* 03/28/2012   wants to get B 12 shot today   Patient Active Problem List   Diagnosis Date Noted  . UTI (lower urinary tract infection) 02/24/2013  . Mobility impaired 06/26/2011  . Retinal detachment 01/10/2011  . Sleep apnea 11/28/2010  . Depression with anxiety 08/25/2010  . CVA WITH LEFT HEMIPARESIS 07/05/2010  . POSTHERPETIC NEURALGIA 11/09/2009  . OSTEOPENIA 12/20/2008  . MUSCLE WEAKNESS (GENERALIZED) 08/03/2008  . RENAL INSUFFICIENCY 06/29/2008  . BACK PAIN 01/26/2008  . EDEMA 01/26/2008  . B12 DEFICIENCY 01/10/2007  . DIABETES MELLITUS, TYPE II 11/27/2006  . HYPERLIPIDEMIA 11/27/2006  . HYPERTENSION 11/27/2006  . FIBROCYSTIC BREAST DISEASE 11/27/2006  . ROSACEA 11/27/2006  . OSTEOARTHRITIS 11/27/2006  . URINARY INCONTINENCE, MIXED 11/27/2006  . ANGIOEDEMA 11/27/2006   Past Medical History  Diagnosis Date  . Sleep apnea   . Diabetes mellitus     type II  . Hypertension   . Hyperlipidemia   . Stroke     Small vessel sobcortical (in [place] trial) with Dr Leonie Man  . Osteoarthritis   . Angioedema     possibly from voltaren  . Vitamin B 12 deficiency 04/08  . LVH (left ventricular hypertrophy)      and atrial enlargement by echo in past with nl EF  . Degenerative disc disease   . Osteopenia   . Nasal pruritis   . Renal insufficiency    Past Surgical History  Procedure Laterality Date  . Eye surgery      cataract extraction  . Abdominal hysterectomy      BSO-fibroids  . Knee surgery      arthroscope  . Spine surgery  08/09    spinal decompression surgery  . Retinal detachment surgery  02/18/11    times 2  . Back surgery    . Colon surgery      due to punctured intestines  . Appendectomy    . Pars plana vitrectomy  07/31/2011    Procedure: PARS PLANA VITRECTOMY WITH 25 GAUGE;  Surgeon: [name], MD;  Location: [place];  Service: Ophthalmology;  Laterality: Right;  REMOVAL OF SILICONE OIL AND LASER RIGHT EYE   History  Substance Use Topics  . Smoking status: Never Smoker   . Smokeless tobacco: Not on file  . Alcohol Use: No   Family History  Problem Relation Age of Onset  . Cancer Sister     brain tumor with hemmorhage  . Heart disease Sister     CAD  . COPD Brother  Allergies  Allergen Reactions  . Diclofenac Sodium     REACTION: angioedema  . Pioglitazone     REACTION: swelling   Current Outpatient Prescriptions on File Prior to Visit  Medication Sig Dispense Refill  . amLODipine (NORVASC) 10 MG tablet Take 10 mg by mouth daily.      Marland Kitchen aspirin 325 MG tablet Take 325 mg by mouth daily.       . calcium-vitamin D (OSCAL WITH D) 500-200 MG-UNIT per tablet Take 1 tablet by mouth daily.       . Cholecalciferol (VITAMIN D) 1000 UNITS capsule Take 1,000 Units by mouth daily.       . cyanocobalamin (,VITAMIN B-12,) 1000 MCG/ML injection Inject 1,000 mcg into the muscle every 30 (thirty) days.       Marland Kitchen gemfibrozil (LOPID) 600 MG tablet Take 1 tablet (600 mg total) by mouth daily.  90 tablet  3  . hydrALAZINE (APRESOLINE) 50 MG tablet Take 50 mg by mouth 3 (three) times daily.        . metoprolol succinate (TOPROL-XL) 25 MG 24 hr tablet Take 0.5 tablets (12.5 mg  total) by mouth 2 (two) times daily.  90 tablet  1  . mirtazapine (REMERON) 15 MG tablet Take 0.5 tablets (7.5 mg total) by mouth at bedtime. 1/2 pill by mouth each bedtime  45 tablet  1  . Multiple Vitamin (MULTIVITAMIN) capsule Take 1 capsule by mouth daily.       . sertraline (ZOLOFT) 50 MG tablet Take 1 tablet (50 mg total) by mouth daily.  90 tablet  1  . trandolapril (MAVIK) 4 MG tablet Take 1 tablet (4 mg total) by mouth daily.  90 tablet  1   No current facility-administered medications on file prior to visit.    Review of Systems Review of Systems  Constitutional: Negative for fever, appetite change, fatigue and unexpected weight change.  Eyes: Negative for pain and visual disturbance.  Respiratory: Negative for cough and shortness of breath.   Cardiovascular: Negative for cp or palpitations    Gastrointestinal: Negative for nausea, diarrhea and constipation.  Genitourinary: pos  for urgency and frequency.  Skin: Negative for pallor or rash   Neurological: Negative for weakness, light-headedness, numbness and headaches.  Hematological: Negative for adenopathy. Does not bruise/bleed easily.  Psychiatric/Behavioral: Negative for dysphoric mood. The patient is not nervous/anxious.         Objective:   Physical Exam  Constitutional: She appears well-developed and well-nourished. No distress.  HENT:  Head: Normocephalic and atraumatic.  Mouth/Throat: Oropharynx is clear and moist.  Eyes: Conjunctivae and EOM are normal. Pupils are equal, round, and reactive to light. No scleral icterus.  Neck: Normal range of motion. Neck supple.  Cardiovascular: Normal rate and regular rhythm.   Pulmonary/Chest: Effort normal and breath sounds normal. No respiratory distress. She has no wheezes.  Abdominal: Soft. Bowel sounds are normal. She exhibits no distension and no mass. There is tenderness. There is no rebound and no guarding.  Mild suprapubic tenderness without rebound or guarding   Musculoskeletal: She exhibits no edema.  Mild L cva tenderness  Lymphadenopathy:    She has no cervical adenopathy.  Neurological: She is alert. She has normal reflexes.  Skin: Skin is warm and dry. No rash noted.  Psychiatric: She has a normal mood and affect.          Assessment & Plan:

## 2013-02-24 NOTE — Patient Instructions (Addendum)
You have a urinary tract infection Take the cipro as directed for 5 days- I sent that to the pharmacy Drink lots of water  We will get a urine culture and call you with results Update if not starting to improve in a week or if worsening

## 2013-02-26 LAB — URINE CULTURE: Colony Count: >=100000

## 2013-03-06 DIAGNOSIS — D509 Iron deficiency anemia, unspecified: Secondary | ICD-10-CM | POA: Diagnosis not present

## 2013-03-06 DIAGNOSIS — E538 Deficiency of other specified B group vitamins: Secondary | ICD-10-CM | POA: Diagnosis not present

## 2013-03-06 LAB — HEMOGLOBIN: HGB: 11.5 g/dL — ABNORMAL LOW (ref 12.0–16.0)

## 2013-03-20 ENCOUNTER — Ambulatory Visit: Payer: Self-pay | Admitting: Internal Medicine

## 2013-03-31 ENCOUNTER — Ambulatory Visit (INDEPENDENT_AMBULATORY_CARE_PROVIDER_SITE_OTHER): Payer: Medicare Other | Admitting: Family Medicine

## 2013-03-31 ENCOUNTER — Other Ambulatory Visit (INDEPENDENT_AMBULATORY_CARE_PROVIDER_SITE_OTHER): Payer: Medicare Other

## 2013-03-31 ENCOUNTER — Ambulatory Visit (INDEPENDENT_AMBULATORY_CARE_PROVIDER_SITE_OTHER)
Admission: RE | Admit: 2013-03-31 | Discharge: 2013-03-31 | Disposition: A | Discharge status: Home / Self Care | Payer: Medicare Other | Source: Ambulatory Visit | Attending: Family Medicine | Admitting: Family Medicine

## 2013-03-31 ENCOUNTER — Encounter: Payer: Self-pay | Admitting: Family Medicine

## 2013-03-31 VITALS — BP 178/78 | HR 77 | Temp 97.7°F | Wt 172.5 lb

## 2013-03-31 DIAGNOSIS — E119 Type 2 diabetes mellitus without complications: Secondary | ICD-10-CM | POA: Diagnosis not present

## 2013-03-31 DIAGNOSIS — E538 Deficiency of other specified B group vitamins: Secondary | ICD-10-CM

## 2013-03-31 DIAGNOSIS — M6281 Muscle weakness (generalized): Secondary | ICD-10-CM

## 2013-03-31 DIAGNOSIS — M25519 Pain in unspecified shoulder: Secondary | ICD-10-CM

## 2013-03-31 DIAGNOSIS — S46909A Unspecified injury of unspecified muscle, fascia and tendon at shoulder and upper arm level, unspecified arm, initial encounter: Secondary | ICD-10-CM | POA: Diagnosis not present

## 2013-03-31 DIAGNOSIS — S4980XA Other specified injuries of shoulder and upper arm, unspecified arm, initial encounter: Secondary | ICD-10-CM | POA: Diagnosis not present

## 2013-03-31 DIAGNOSIS — I10 Essential (primary) hypertension: Secondary | ICD-10-CM | POA: Diagnosis not present

## 2013-03-31 DIAGNOSIS — E785 Hyperlipidemia, unspecified: Secondary | ICD-10-CM

## 2013-03-31 DIAGNOSIS — M25512 Pain in left shoulder: Secondary | ICD-10-CM

## 2013-03-31 LAB — COMPREHENSIVE METABOLIC PANEL
ALT: 19 U/L (ref 0–35)
AST: 21 U/L (ref 0–37)
Albumin: 3.6 g/dL (ref 3.5–5.2)
Alkaline Phosphatase: 79 U/L (ref 39–117)
BUN: 21 mg/dL (ref 6–23)
CO2: 26 mEq/L (ref 19–32)
Calcium: 9.2 mg/dL (ref 8.4–10.5)
Chloride: 107 mEq/L (ref 96–112)
Creatinine, Ser: 1.5 mg/dL — ABNORMAL HIGH (ref 0.4–1.2)
GFR: 34.53 mL/min — ABNORMAL LOW (ref >60.00)
Glucose, Bld: 140 mg/dL — ABNORMAL HIGH (ref 70–99)
Potassium: 4.1 mEq/L (ref 3.5–5.1)
Sodium: 141 mEq/L (ref 135–145)
Total Bilirubin: 0.7 mg/dL (ref 0.3–1.2)
Total Protein: 6.3 g/dL (ref 6.0–8.3)

## 2013-03-31 LAB — LIPID PANEL
Cholesterol: 174 mg/dL (ref 0–200)
HDL: 37.3 mg/dL — ABNORMAL LOW (ref >39.00)
LDL Cholesterol: 114 mg/dL — ABNORMAL HIGH (ref 0–99)
Total CHOL/HDL Ratio: 5
Triglycerides: 113 mg/dL (ref 0.0–149.0)
VLDL: 22.6 mg/dL (ref 0.0–40.0)

## 2013-03-31 LAB — VITAMIN B12: Vitamin B-12: 449 pg/mL (ref 211–911)

## 2013-03-31 LAB — HEMOGLOBIN A1C: Hgb A1c MFr Bld: 6.3 % (ref 4.6–6.5)

## 2013-03-31 NOTE — Assessment & Plan Note (Addendum)
Initially concern for dislocated shoulder so xrays obtained with STAT read - 1 hour later still no radiologist read. Does not seem consistent with shoulder dislocation on my read, anticipate more shoulder bursitis/supraspinatus tendonitis.  Will treat as such with tylenol scheduled for 1 week, ice to shoulder, and stretching exercises from [facility] pt advisor on RTC injury. Has appt with PCP in 1 week, will f/u then. Discussed return to clinic sooner if any worsening pain or immobility of L shoulder  Pt agrees with plan.

## 2013-03-31 NOTE — Patient Instructions (Addendum)
I've called and cancelled the appointment with the orthopedist for today. I don't think there's a shoulder dislocation today.  I think you injured one of the tendons of your shoulder Do stretching exercises provided today. May use ice to shoulder as well. Use tylenol 500mg  one pill twice daily for the next 1 week. Let us know if not improving for further evaluation and possible referral to the orthopedist.

## 2013-03-31 NOTE — Progress Notes (Signed)
  Subjective:    Patient ID: Renee [name], female    DOB: 05/19/1929, 79 y.o.   MRN: [id number]  HPI CC L shoulder pain after fall  DOI:03/23/2013 Picking blueberries in her yard - dropped one and fell when bending over.  Fell and hit L shoulder/arm.  Happened at [place] to get back up until 8:50pm - had to roll over on floor through entire front yard until she reached swing and was able to pull herself up.   L shoulder pain present after fall but worsened later that night. Points to posterior and superior left shoulder as sites of pain. Denies neck pain, no shooting pain down arm.  No numbness of L arm.  Still able to lift arm with mild pain.  No h/o shoulder problems in the past.  Has been using ice pack for 2 days which helped.  Hasn't tried any analgesics.  H/o CVA 05/2010 - Small vessel sobcortical (in [place] trial) with Dr Leonie Man, residual L hemiparesis  Past Medical History  Diagnosis Date  . Sleep apnea   . Diabetes mellitus     type II  . Hypertension   . Hyperlipidemia   . Stroke 05/2010    Small vessel sobcortical (in [place] trial) with Dr Leonie Man, residual L hemiparesis  . Osteoarthritis   . Angioedema     possibly from voltaren  . Vitamin B 12 deficiency 04/08  . LVH (left ventricular hypertrophy)     and atrial enlargement by echo in past with nl EF  . Degenerative disc disease   . Osteopenia   . Nasal pruritis   . Renal insufficiency    Past Surgical History  Procedure Laterality Date  . Eye surgery      cataract extraction  . Abdominal hysterectomy      BSO-fibroids  . Knee surgery      arthroscope  . Spine surgery  08/09    spinal decompression surgery  . Retinal detachment surgery  02/18/11    times 2  . Back surgery    . Colon surgery      due to punctured intestines  . Appendectomy    . Pars plana vitrectomy  07/31/2011    Procedure: PARS PLANA VITRECTOMY WITH 25 GAUGE;  Surgeon: [name], MD;  Location: [place];  Service: Ophthalmology;   Laterality: Right;  REMOVAL OF SILICONE OIL AND LASER RIGHT EYE     Review of Systems Per HPI    Objective:   Physical Exam  Nursing note and vitals reviewed. Constitutional: She appears well-developed and well-nourished. No distress.  Musculoskeletal: She exhibits no edema.  Holds left shoulder depressed compared to right side. Preserved passive ROM L shoulder in forward flexion and abduction Tender to palpation at [facility] joint and at mid humeral shaft.  Xrays obtained prior to further exam.  No pain with testing RTC with int/ ext rotation.   Positive l empty can sign - tender with resistance  2+ rad pulses Brisk cap refill   Neurological:  decreased strength of L arm at baseline 2/2 CVA.      Assessment & Plan:

## 2013-04-07 ENCOUNTER — Encounter: Payer: Self-pay | Admitting: Family Medicine

## 2013-04-07 ENCOUNTER — Ambulatory Visit (INDEPENDENT_AMBULATORY_CARE_PROVIDER_SITE_OTHER): Payer: Medicare Other | Admitting: Family Medicine

## 2013-04-07 VITALS — BP 114/62 | HR 67 | Temp 97.9°F | Ht 64.0 in | Wt 177.2 lb

## 2013-04-07 DIAGNOSIS — M25519 Pain in unspecified shoulder: Secondary | ICD-10-CM

## 2013-04-07 DIAGNOSIS — R69 Illness, unspecified: Secondary | ICD-10-CM | POA: Diagnosis not present

## 2013-04-07 DIAGNOSIS — M25512 Pain in left shoulder: Secondary | ICD-10-CM

## 2013-04-07 DIAGNOSIS — E538 Deficiency of other specified B group vitamins: Secondary | ICD-10-CM | POA: Diagnosis not present

## 2013-04-07 DIAGNOSIS — R0789 Other chest pain: Secondary | ICD-10-CM | POA: Insufficient documentation

## 2013-04-07 DIAGNOSIS — I69959 Hemiplegia and hemiparesis following unspecified cerebrovascular disease affecting unspecified side: Secondary | ICD-10-CM

## 2013-04-07 DIAGNOSIS — Z7409 Other reduced mobility: Secondary | ICD-10-CM

## 2013-04-07 DIAGNOSIS — R071 Chest pain on breathing: Secondary | ICD-10-CM

## 2013-04-07 MED ORDER — CYANOCOBALAMIN 1000 MCG/ML IJ SOLN
1000.0000 ug | Freq: Once | INTRAMUSCULAR | Status: AC
  Administered 2013-04-07: 1000 ug via INTRAMUSCULAR

## 2013-04-07 NOTE — Assessment & Plan Note (Signed)
S/p trauma from fall  Reassuring exam with good rom for the most part Rev prior xray-also reassuring  Some rib pain - relatively mild/ will watch this  Spent much time disc fall prev today

## 2013-04-07 NOTE — Assessment & Plan Note (Signed)
B12 injection today 
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
## 2013-04-07 NOTE — Assessment & Plan Note (Signed)
From recent fall  Old ecchymosis seen and mild tenderness Recommend cool compresses and effort to take deep breaths to prevent atelectasis Update if worse or no improvement in symptoms
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
## 2013-04-07 NOTE — Assessment & Plan Note (Signed)
Disc this in setting of fall risk-no advancement of cva symptoms but given her limitations of mobility I strongly recommend use of walker at all times as well as life alert button

## 2013-04-07 NOTE — Patient Instructions (Addendum)
I think your shoulder will continue to improve- let me know if any problems and don't overdo it with lifting  Get a new walker- keep it with you at all times since your fall risk is high (indoors and out) Also always wear the life alert button  Re schedule your medicare physical (any 30 min visit is fine)

## 2013-04-17 ENCOUNTER — Ambulatory Visit (INDEPENDENT_AMBULATORY_CARE_PROVIDER_SITE_OTHER): Payer: Medicare Other | Admitting: Family Medicine

## 2013-04-17 ENCOUNTER — Encounter: Payer: Self-pay | Admitting: Family Medicine

## 2013-04-17 VITALS — BP 130/82 | HR 57 | Temp 98.4°F | Ht 62.25 in | Wt 178.5 lb

## 2013-04-17 DIAGNOSIS — E538 Deficiency of other specified B group vitamins: Secondary | ICD-10-CM

## 2013-04-17 DIAGNOSIS — E119 Type 2 diabetes mellitus without complications: Secondary | ICD-10-CM

## 2013-04-17 DIAGNOSIS — Z0000 Encounter for general adult medical examination without abnormal findings: Secondary | ICD-10-CM | POA: Insufficient documentation

## 2013-04-17 DIAGNOSIS — I10 Essential (primary) hypertension: Secondary | ICD-10-CM | POA: Diagnosis not present

## 2013-04-17 DIAGNOSIS — M899 Disorder of bone, unspecified: Secondary | ICD-10-CM

## 2013-04-17 DIAGNOSIS — N259 Disorder resulting from impaired renal tubular function, unspecified: Secondary | ICD-10-CM | POA: Diagnosis not present

## 2013-04-17 DIAGNOSIS — Z23 Encounter for immunization: Secondary | ICD-10-CM | POA: Diagnosis not present

## 2013-04-17 NOTE — Progress Notes (Signed)
Subjective:    Patient ID: Renee [name], female    DOB: 21-Dec-1928, 79 y.o.   MRN: [id number]  HPI I have personally reviewed the Medicare Annual Wellness questionnaire and have noted 1. The patient's medical and social history 2. Their use of alcohol, tobacco or illicit drugs 3. Their current medications and supplements 4. The patient's functional ability including ADL's, fall risks, home safety risks and hearing or visual             impairment. 5. Diet and physical activities 6. Evidence for depression or mood disorders  The patients weight, height, BMI have been recorded in the chart and visual acuity is per eye clinic.  I have made referrals, counseling and provided education to the patient based review of the above and I have provided the pt with a written personalized care plan for preventive services.  See scanned forms.  Routine anticipatory guidance given to patient.  See health maintenance. Flu shot-will get that today  Shingles - insurance will not cover the vaccine  PNA 2/07 Tetanus vaccine 8/13 Colon 12/09 with polyp - then has done stool cards this summer - told she did not need more colonoscopies  Breast cancer screening 8/13 - has her mammogram planned 04/22/13 Self exam -ok/ no changes  Advance directive-has a living will set up  Cognitive function addressed- see scanned forms- and if abnormal then additional documentation follows. - no worries about memory- still handles her finances / no problems   Falls - see last visit sustained a fall without walker/ counseled extensively on this/no fx She got a new walker - happy with that   Mood is about the same , has to deal with a lot of stressors and mood is stable   Renal insuff- Sees renal in sept for regular follow up    Chemistry      Component Value Date/Time   NA 141 03/31/2013 0913   K 4.1 03/31/2013 0913   CL 107 03/31/2013 0913   CO2 26 03/31/2013 0913   BUN 21 03/31/2013 0913   CREATININE 1.5* 03/31/2013  0913      Component Value Date/Time   CALCIUM 9.2 03/31/2013 0913   ALKPHOS 79 03/31/2013 0913   AST 21 03/31/2013 0913   ALT 19 03/31/2013 0913   BILITOT 0.7 03/31/2013 0913      Hyperglycemia Lab Results  Component Value Date   HGBA1C 6.3 03/31/2013   no meds Up from 5.8 Her appetite is better and needs to watch more for sugar   opthy 9/14 Followed for rentinal detatchment Has another appt next week   Dexa 4/10 -osteopenia  Never got a follow up letter   Lab Results  Component Value Date   CHOL 174 03/31/2013   CHOL 175 10/02/2011   CHOL 182 03/28/2011   Lab Results  Component Value Date   HDL 37.30* 03/31/2013   HDL 42.90 10/02/2011   HDL 36.80* 03/28/2011   Lab Results  Component Value Date   LDLCALC 114* 03/31/2013   LDLCALC 115* 10/02/2011   LDLCALC 114* 03/28/2011   Lab Results  Component Value Date   TRIG 113.0 03/31/2013   TRIG 84.0 10/02/2011   TRIG 158.0* 03/28/2011   Lab Results  Component Value Date   CHOLHDL 5 03/31/2013   CHOLHDL 4 10/02/2011   CHOLHDL 5 03/28/2011   No results found for this basename: LDLDIRECT   pretty stable overall    PMH and SH reviewed  Meds, vitals, and allergies  reviewed.   ROS: See HPI.  Otherwise negative.    Patient Active Problem List   Diagnosis Date Noted  . Encounter for Medicare annual wellness exam 04/17/2013  . Chest wall pain 04/07/2013  . Left shoulder pain 03/31/2013  . UTI (lower urinary tract infection) 02/24/2013  . Mobility impaired 06/26/2011  . Retinal detachment 01/10/2011  . Sleep apnea 11/28/2010  . Depression with anxiety 08/25/2010  . CVA WITH LEFT HEMIPARESIS 07/05/2010  . POSTHERPETIC NEURALGIA 11/09/2009  . OSTEOPENIA 12/20/2008  . MUSCLE WEAKNESS (GENERALIZED) 08/03/2008  . RENAL INSUFFICIENCY 06/29/2008  . BACK PAIN 01/26/2008  . EDEMA 01/26/2008  . B12 DEFICIENCY 01/10/2007  . DIABETES MELLITUS, TYPE II 11/27/2006  . HYPERLIPIDEMIA 11/27/2006  . HYPERTENSION 11/27/2006  . FIBROCYSTIC  BREAST DISEASE 11/27/2006  . ROSACEA 11/27/2006  . OSTEOARTHRITIS 11/27/2006  . URINARY INCONTINENCE, MIXED 11/27/2006  . ANGIOEDEMA 11/27/2006   Past Medical History  Diagnosis Date  . Sleep apnea   . Diabetes mellitus     type II  . Hypertension   . Hyperlipidemia   . Stroke 05/2010    Small vessel sobcortical (in [place] trial) with Dr Leonie Man, residual L hemiparesis  . Osteoarthritis   . Angioedema     possibly from voltaren  . Vitamin B 12 deficiency 04/08  . LVH (left ventricular hypertrophy)     and atrial enlargement by echo in past with nl EF  . Degenerative disc disease   . Osteopenia   . Nasal pruritis   . Renal insufficiency    Past Surgical History  Procedure Laterality Date  . Eye surgery      cataract extraction  . Abdominal hysterectomy      BSO-fibroids  . Knee surgery      arthroscope  . Spine surgery  08/09    spinal decompression surgery  . Retinal detachment surgery  02/18/11    times 2  . Back surgery    . Colon surgery      due to punctured intestines  . Appendectomy    . Pars plana vitrectomy  07/31/2011    Procedure: PARS PLANA VITRECTOMY WITH 25 GAUGE;  Surgeon: [name], MD;  Location: [facility];  Service: Ophthalmology;  Laterality: Right;  REMOVAL OF SILICONE OIL AND LASER RIGHT EYE   History  Substance Use Topics  . Smoking status: Never Smoker   . Smokeless tobacco: Not on file  . Alcohol Use: No   Family History  Problem Relation Age of Onset  . Cancer Sister     brain tumor with hemmorhage  . Heart disease Sister     CAD  . COPD Brother    Allergies  Allergen Reactions  . Diclofenac Sodium     REACTION: angioedema  . Pioglitazone     REACTION: swelling   Current Outpatient Prescriptions on File Prior to Visit  Medication Sig Dispense Refill  . acetaminophen (TYLENOL) 500 MG tablet Take 500 mg by mouth 2 (two) times daily.      Marland Kitchen amLODipine (NORVASC) 10 MG tablet Take 10 mg by mouth daily.      Marland Kitchen aspirin 325 MG tablet  Take 325 mg by mouth daily.       . calcium-vitamin D (OSCAL WITH D) 500-200 MG-UNIT per tablet Take 1 tablet by mouth daily.       . Cholecalciferol (VITAMIN D) 1000 UNITS capsule Take 1,000 Units by mouth daily.       . cyanocobalamin (,VITAMIN B-12,) 1000 MCG/ML injection  Inject 1,000 mcg into the muscle every 30 (thirty) days.       . ferrous sulfate 325 (65 FE) MG tablet Take 325 mg by mouth 2 (two) times daily.      Marland Kitchen gemfibrozil (LOPID) 600 MG tablet Take 1 tablet (600 mg total) by mouth daily.  90 tablet  3  . hydrALAZINE (APRESOLINE) 50 MG tablet Take 50 mg by mouth 3 (three) times daily.        . metoprolol succinate (TOPROL-XL) 25 MG 24 hr tablet Take 0.5 tablets (12.5 mg total) by mouth 2 (two) times daily.  90 tablet  1  . mirtazapine (REMERON) 15 MG tablet Take 0.5 tablets (7.5 mg total) by mouth at bedtime. 1/2 pill by mouth each bedtime  45 tablet  1  . Multiple Vitamin (MULTIVITAMIN) capsule Take 1 capsule by mouth daily.       . sertraline (ZOLOFT) 50 MG tablet Take 1 tablet (50 mg total) by mouth daily.  90 tablet  1  . trandolapril (MAVIK) 4 MG tablet Take 1 tablet (4 mg total) by mouth daily.  90 tablet  1   No current facility-administered medications on file prior to visit.    Review of Systems Review of Systems  Constitutional: Negative for fever, appetite change, fatigue and unexpected weight change.  Eyes: Negative for pain and visual disturbance.  Respiratory: Negative for cough and shortness of breath.   Cardiovascular: Negative for cp or palpitations    Gastrointestinal: Negative for nausea, diarrhea and constipation.  Genitourinary: Negative for urgency and frequency.  Skin: Negative for pallor or rash   Neurological: Negative for weakness, light-headedness, numbness and headaches. pos for slowed speech from CVA that has improved much Hematological: Negative for adenopathy. Does not bruise/bleed easily.  Psychiatric/Behavioral: Negative for dysphoric mood. The  patient is not nervous/anxious.         Objective:   Physical Exam  Constitutional: She appears well-developed and well-nourished. No distress.  obese and well appearing   HENT:  Head: Normocephalic and atraumatic.  Right Ear: External ear normal.  Left Ear: External ear normal.  Nose: Nose normal.  Mouth/Throat: Oropharynx is clear and moist.  Eyes: Conjunctivae and EOM are normal. Pupils are equal, round, and reactive to light. Right eye exhibits no discharge. Left eye exhibits no discharge. No scleral icterus.  Neck: Normal range of motion. Neck supple. No JVD present. Carotid bruit is not present. No thyromegaly present.  Cardiovascular: Normal rate, regular rhythm and intact distal pulses.  Exam reveals no gallop.   Pulmonary/Chest: Effort normal and breath sounds normal. No respiratory distress. She has no wheezes.  No crackles  Abdominal: Soft. Bowel sounds are normal. She exhibits no distension, no abdominal bruit and no mass. There is no tenderness.  Musculoskeletal: She exhibits no edema and no tenderness.  Lymphadenopathy:    She has no cervical adenopathy.  Neurological: She is alert. She has normal reflexes. No cranial nerve deficit. She exhibits normal muscle tone. Coordination normal.  Gait is steady with walker  Speech is slow but very clear today  Skin: Skin is warm and dry. No rash noted. No erythema. No pallor.  Psychiatric: She has a normal mood and affect.          Assessment & Plan:

## 2013-04-17 NOTE — Patient Instructions (Addendum)
Flu shot today  Follow up with your kidney doctor as planned  Make sure you are drinking enough water  We will refer you for a bone density test at check out  Stay active/ use your walker and take care of yourself  Watch sugar in diet  Follow up in 6 months with labs prior

## 2013-04-19 NOTE — Assessment & Plan Note (Signed)
Cr is up to 1.5  Has f/u upcoming with renal  Stressed imp of water intake and avoidance of nsaids

## 2013-04-19 NOTE — Assessment & Plan Note (Signed)
Overdue for dexa  Will schedule that  Disc ca and D  Disc fall risk in detail-much better now with new walker that she uses all the time

## 2013-04-19 NOTE — Assessment & Plan Note (Signed)
Lab Results  Component Value Date   [id number] 03/31/2013   Will continue current suppl

## 2013-04-19 NOTE — Assessment & Plan Note (Signed)
bp in fair control at this time  No changes needed  Disc lifstyle change with low sodium diet and exercise   

## 2013-04-19 NOTE — Assessment & Plan Note (Signed)
A1c is up but still well controlled  Diet only  Disc stopping sweets and watching carbs Is more active now  F/u 6 mo with lab

## 2013-04-19 NOTE — Assessment & Plan Note (Signed)
Reviewed health habits including diet and exercise and skin cancer prevention Also reviewed health mt list, fam hx and immunizations  See HPI Rev wellness labs

## 2013-04-22 ENCOUNTER — Ambulatory Visit (INDEPENDENT_AMBULATORY_CARE_PROVIDER_SITE_OTHER): Payer: Medicare Other | Admitting: Ophthalmology

## 2013-04-22 DIAGNOSIS — E1139 Type 2 diabetes mellitus with other diabetic ophthalmic complication: Secondary | ICD-10-CM

## 2013-04-22 DIAGNOSIS — E11319 Type 2 diabetes mellitus with unspecified diabetic retinopathy without macular edema: Secondary | ICD-10-CM | POA: Diagnosis not present

## 2013-04-22 DIAGNOSIS — H33009 Unspecified retinal detachment with retinal break, unspecified eye: Secondary | ICD-10-CM

## 2013-04-22 DIAGNOSIS — I10 Essential (primary) hypertension: Secondary | ICD-10-CM

## 2013-04-22 DIAGNOSIS — H3530 Unspecified macular degeneration: Secondary | ICD-10-CM

## 2013-04-22 DIAGNOSIS — H43819 Vitreous degeneration, unspecified eye: Secondary | ICD-10-CM

## 2013-04-22 DIAGNOSIS — Z1231 Encounter for screening mammogram for malignant neoplasm of breast: Secondary | ICD-10-CM | POA: Diagnosis not present

## 2013-04-22 DIAGNOSIS — H35039 Hypertensive retinopathy, unspecified eye: Secondary | ICD-10-CM

## 2013-04-23 ENCOUNTER — Encounter: Payer: Self-pay | Admitting: Family Medicine

## 2013-04-24 ENCOUNTER — Encounter: Payer: Self-pay | Admitting: *Deleted

## 2013-04-28 ENCOUNTER — Ambulatory Visit (INDEPENDENT_AMBULATORY_CARE_PROVIDER_SITE_OTHER)
Admission: RE | Admit: 2013-04-28 | Discharge: 2013-04-28 | Disposition: A | Discharge status: Home / Self Care | Payer: Medicare Other | Source: Ambulatory Visit | Attending: Family Medicine | Admitting: Family Medicine

## 2013-04-28 DIAGNOSIS — M899 Disorder of bone, unspecified: Secondary | ICD-10-CM | POA: Diagnosis not present

## 2013-04-28 LAB — HM DEXA SCAN: HM Dexa Scan: BORDERLINE

## 2013-04-30 ENCOUNTER — Other Ambulatory Visit: Payer: Self-pay | Admitting: *Deleted

## 2013-04-30 MED ORDER — METOPROLOL SUCCINATE ER 25 MG PO TB24: 12.5000 mg | ORAL_TABLET | Freq: Two times a day (BID) | ORAL | Status: DC

## 2013-04-30 MED ORDER — MIRTAZAPINE 15 MG PO TABS: 7.5000 mg | ORAL_TABLET | Freq: Every day | ORAL | Status: DC

## 2013-04-30 NOTE — Telephone Encounter (Signed)
done

## 2013-04-30 NOTE — Telephone Encounter (Signed)
Please refill for a year  

## 2013-05-01 DIAGNOSIS — R82998 Other abnormal findings in urine: Secondary | ICD-10-CM | POA: Diagnosis not present

## 2013-05-01 DIAGNOSIS — N3946 Mixed incontinence: Secondary | ICD-10-CM | POA: Diagnosis not present

## 2013-05-05 ENCOUNTER — Encounter: Payer: Self-pay | Admitting: *Deleted

## 2013-05-05 ENCOUNTER — Encounter: Payer: Self-pay | Admitting: Family Medicine

## 2013-05-11 DIAGNOSIS — D500 Iron deficiency anemia secondary to blood loss (chronic): Secondary | ICD-10-CM | POA: Diagnosis not present

## 2013-05-11 DIAGNOSIS — N183 Chronic kidney disease, stage 3 unspecified: Secondary | ICD-10-CM | POA: Diagnosis not present

## 2013-05-11 DIAGNOSIS — I10 Essential (primary) hypertension: Secondary | ICD-10-CM | POA: Diagnosis not present

## 2013-05-13 ENCOUNTER — Ambulatory Visit (INDEPENDENT_AMBULATORY_CARE_PROVIDER_SITE_OTHER): Payer: Medicare Other | Admitting: Ophthalmology

## 2013-05-19 DIAGNOSIS — N183 Chronic kidney disease, stage 3 unspecified: Secondary | ICD-10-CM | POA: Diagnosis not present

## 2013-05-28 ENCOUNTER — Other Ambulatory Visit: Payer: Self-pay | Admitting: Family Medicine

## 2013-05-28 NOTE — Telephone Encounter (Signed)
Fax refill request, please advise  

## 2013-05-28 NOTE — Telephone Encounter (Signed)
Please refill for a year, thanks

## 2013-05-29 MED ORDER — SERTRALINE HCL 50 MG PO TABS: 50.0000 mg | ORAL_TABLET | Freq: Every day | ORAL | Status: DC

## 2013-05-29 NOTE — Telephone Encounter (Signed)
done

## 2013-06-09 DIAGNOSIS — N3946 Mixed incontinence: Secondary | ICD-10-CM | POA: Diagnosis not present

## 2013-06-16 ENCOUNTER — Telehealth: Payer: Self-pay

## 2013-06-16 NOTE — Telephone Encounter (Signed)
Harriett with [facility] left v/m requesting addendum to 04/07/13 visit that pt needs walker for eating,bathing, grooming, toileting and to prevent falls. Harriett received certificate of medical necessity for a walker but needs documentation added to 04/07/13 so ins. Will reimburse pt.

## 2013-06-16 NOTE — Telephone Encounter (Signed)
Will do  I will put note in IN box

## 2013-06-18 NOTE — Telephone Encounter (Signed)
Spoke with Harriett and got fax # [phone] to fax it back to, office note faxed

## 2013-07-02 ENCOUNTER — Other Ambulatory Visit: Payer: Self-pay | Admitting: *Deleted

## 2013-07-02 MED ORDER — GEMFIBROZIL 600 MG PO TABS: 600.0000 mg | ORAL_TABLET | Freq: Every day | ORAL | Status: DC

## 2013-07-21 DIAGNOSIS — N3946 Mixed incontinence: Secondary | ICD-10-CM | POA: Diagnosis not present

## 2013-07-21 DIAGNOSIS — N3941 Urge incontinence: Secondary | ICD-10-CM | POA: Diagnosis not present

## 2013-07-28 DIAGNOSIS — N3941 Urge incontinence: Secondary | ICD-10-CM | POA: Diagnosis not present

## 2013-08-04 DIAGNOSIS — N3941 Urge incontinence: Secondary | ICD-10-CM | POA: Diagnosis not present

## 2013-08-11 DIAGNOSIS — N3941 Urge incontinence: Secondary | ICD-10-CM | POA: Diagnosis not present

## 2013-08-18 DIAGNOSIS — N3941 Urge incontinence: Secondary | ICD-10-CM | POA: Diagnosis not present

## 2013-08-25 DIAGNOSIS — N3941 Urge incontinence: Secondary | ICD-10-CM | POA: Diagnosis not present

## 2013-08-25 DIAGNOSIS — N3946 Mixed incontinence: Secondary | ICD-10-CM | POA: Diagnosis not present

## 2013-08-26 ENCOUNTER — Other Ambulatory Visit: Payer: Self-pay | Admitting: *Deleted

## 2013-08-26 MED ORDER — TRANDOLAPRIL 4 MG PO TABS: 4.0000 mg | ORAL_TABLET | Freq: Every day | ORAL | Status: DC

## 2013-08-31 DIAGNOSIS — D500 Iron deficiency anemia secondary to blood loss (chronic): Secondary | ICD-10-CM | POA: Diagnosis not present

## 2013-08-31 DIAGNOSIS — I10 Essential (primary) hypertension: Secondary | ICD-10-CM | POA: Diagnosis not present

## 2013-08-31 DIAGNOSIS — N183 Chronic kidney disease, stage 3 unspecified: Secondary | ICD-10-CM | POA: Diagnosis not present

## 2013-09-09 DIAGNOSIS — N3941 Urge incontinence: Secondary | ICD-10-CM | POA: Diagnosis not present

## 2013-09-15 DIAGNOSIS — N3941 Urge incontinence: Secondary | ICD-10-CM | POA: Diagnosis not present

## 2013-09-29 DIAGNOSIS — N3941 Urge incontinence: Secondary | ICD-10-CM | POA: Diagnosis not present

## 2013-10-05 DIAGNOSIS — N3941 Urge incontinence: Secondary | ICD-10-CM | POA: Diagnosis not present

## 2013-10-12 ENCOUNTER — Other Ambulatory Visit: Payer: Medicare Other

## 2013-10-12 ENCOUNTER — Telehealth: Payer: Self-pay | Admitting: Family Medicine

## 2013-10-12 DIAGNOSIS — E785 Hyperlipidemia, unspecified: Secondary | ICD-10-CM

## 2013-10-12 DIAGNOSIS — I10 Essential (primary) hypertension: Secondary | ICD-10-CM

## 2013-10-12 DIAGNOSIS — E119 Type 2 diabetes mellitus without complications: Secondary | ICD-10-CM

## 2013-10-12 NOTE — Telephone Encounter (Signed)
Message copied by Abner Greenspan on Mon Oct 12, 2013  7:44 AM ------      Message from: Ellamae Sia      Created: Thu Oct 08, 2013 11:02 AM      Regarding: lab orders for Monday, 2.23.15       Lab orders for a  6 month f/u ------

## 2013-10-14 ENCOUNTER — Other Ambulatory Visit: Payer: Medicare Other

## 2013-10-19 ENCOUNTER — Ambulatory Visit: Payer: Medicare Other | Admitting: Family Medicine

## 2013-10-20 DIAGNOSIS — N3941 Urge incontinence: Secondary | ICD-10-CM | POA: Diagnosis not present

## 2013-10-21 ENCOUNTER — Other Ambulatory Visit (INDEPENDENT_AMBULATORY_CARE_PROVIDER_SITE_OTHER): Payer: Medicare Other

## 2013-10-21 DIAGNOSIS — E119 Type 2 diabetes mellitus without complications: Secondary | ICD-10-CM

## 2013-10-21 DIAGNOSIS — I10 Essential (primary) hypertension: Secondary | ICD-10-CM

## 2013-10-21 DIAGNOSIS — E785 Hyperlipidemia, unspecified: Secondary | ICD-10-CM | POA: Diagnosis not present

## 2013-10-21 LAB — COMPREHENSIVE METABOLIC PANEL
ALT: 15 U/L (ref 0–35)
AST: 14 U/L (ref 0–37)
Albumin: 3.5 g/dL (ref 3.5–5.2)
Alkaline Phosphatase: 71 U/L (ref 39–117)
BUN: 24 mg/dL — ABNORMAL HIGH (ref 6–23)
CO2: 24 mEq/L (ref 19–32)
Calcium: 9.2 mg/dL (ref 8.4–10.5)
Chloride: 110 mEq/L (ref 96–112)
Creatinine, Ser: 1.3 mg/dL — ABNORMAL HIGH (ref 0.4–1.2)
GFR: 40.89 mL/min — ABNORMAL LOW (ref >60.00)
Glucose, Bld: 129 mg/dL — ABNORMAL HIGH (ref 70–99)
Potassium: 4.3 mEq/L (ref 3.5–5.1)
Sodium: 143 mEq/L (ref 135–145)
Total Bilirubin: 0.7 mg/dL (ref 0.3–1.2)
Total Protein: 5.7 g/dL — ABNORMAL LOW (ref 6.0–8.3)

## 2013-10-21 LAB — LIPID PANEL
Cholesterol: 200 mg/dL (ref 0–200)
HDL: 38.5 mg/dL — ABNORMAL LOW (ref >39.00)
LDL Cholesterol: 126 mg/dL — ABNORMAL HIGH (ref 0–99)
Total CHOL/HDL Ratio: 5
Triglycerides: 176 mg/dL — ABNORMAL HIGH (ref 0.0–149.0)
VLDL: 35.2 mg/dL (ref 0.0–40.0)

## 2013-10-21 LAB — HEMOGLOBIN A1C: Hgb A1c MFr Bld: 6.3 % (ref 4.6–6.5)

## 2013-10-23 ENCOUNTER — Ambulatory Visit (INDEPENDENT_AMBULATORY_CARE_PROVIDER_SITE_OTHER): Payer: Medicare Other | Admitting: Family Medicine

## 2013-10-23 ENCOUNTER — Encounter: Payer: Self-pay | Admitting: Family Medicine

## 2013-10-23 VITALS — BP 116/64 | HR 60 | Temp 97.8°F | Ht 62.25 in | Wt 177.0 lb

## 2013-10-23 DIAGNOSIS — I10 Essential (primary) hypertension: Secondary | ICD-10-CM

## 2013-10-23 DIAGNOSIS — E785 Hyperlipidemia, unspecified: Secondary | ICD-10-CM | POA: Diagnosis not present

## 2013-10-23 DIAGNOSIS — N259 Disorder resulting from impaired renal tubular function, unspecified: Secondary | ICD-10-CM | POA: Diagnosis not present

## 2013-10-23 DIAGNOSIS — M899 Disorder of bone, unspecified: Secondary | ICD-10-CM

## 2013-10-23 DIAGNOSIS — M949 Disorder of cartilage, unspecified: Secondary | ICD-10-CM

## 2013-10-23 DIAGNOSIS — E119 Type 2 diabetes mellitus without complications: Secondary | ICD-10-CM

## 2013-10-23 NOTE — Progress Notes (Signed)
Subjective:    Patient ID: Renee [name], female    DOB: [birth year], 80 y.o.   MRN: [id number]  HPI Here for f/u of chronic medical problems   Generally feeling fair  Seemed weaker - so she started to ride her exercise bike 5 min per day-is a little sore-plans to increase her time  Has been lying around with bad weather   Wt is down 1.5 lb with bmi of 32  bp is stable today  No cp or palpitations or headaches or edema  No side effects to medicines  BP Readings from Last 3 Encounters:  10/23/13 116/64  04/17/13 130/82  04/07/13 114/62     Seeing renal for renal insuff On ace  Watching closely Lab Results  Component Value Date   CREATININE 1.3* 10/21/2013   this is improved   Hyperglycemia No meds  Lab Results  Component Value Date   HGBA1C 6.3 10/21/2013   this is stable from last time  Is really watching diet    Hyperlipidemia Diet controlled Lab Results  Component Value Date   CHOL 200 10/21/2013   CHOL 174 03/31/2013   CHOL 175 10/02/2011   Lab Results  Component Value Date   HDL 38.50* 10/21/2013   HDL 37.30* 03/31/2013   HDL 42.90 10/02/2011   Lab Results  Component Value Date   LDLCALC 126* 10/21/2013   LDLCALC 114* 03/31/2013   LDLCALC 115* 10/02/2011   Lab Results  Component Value Date   TRIG 176.0* 10/21/2013   TRIG 113.0 03/31/2013   TRIG 84.0 10/02/2011   Lab Results  Component Value Date   CHOLHDL 5 10/21/2013   CHOLHDL 5 03/31/2013   CHOLHDL 4 10/02/2011   No results found for this basename: LDLDIRECT   this is up a bit - LDL /but HDL is up a point   Is getting treatments for incontinence - ? If it is working - will do that for 3 months   dexa 9/14 Osteopenia significant  She is taking calcium and vitamin D  Cannot have fosamax due to renal insuff Nor evista in light of hx of stroke   Mood is good  She is getting more independent and able to drive now   Patient Active Problem List   Diagnosis Date Noted  . Encounter for Medicare annual  wellness exam 04/17/2013  . Chest wall pain 04/07/2013  . Left shoulder pain 03/31/2013  . Mobility impaired 06/26/2011  . Retinal detachment 01/10/2011  . Sleep apnea 11/28/2010  . Depression with anxiety 08/25/2010  . CVA WITH LEFT HEMIPARESIS 07/05/2010  . POSTHERPETIC NEURALGIA 11/09/2009  . OSTEOPENIA 12/20/2008  . MUSCLE WEAKNESS (GENERALIZED) 08/03/2008  . RENAL INSUFFICIENCY 06/29/2008  . BACK PAIN 01/26/2008  . EDEMA 01/26/2008  . B12 DEFICIENCY 01/10/2007  . DIABETES MELLITUS, TYPE II 11/27/2006  . HYPERLIPIDEMIA 11/27/2006  . HYPERTENSION 11/27/2006  . FIBROCYSTIC BREAST DISEASE 11/27/2006  . ROSACEA 11/27/2006  . OSTEOARTHRITIS 11/27/2006  . URINARY INCONTINENCE, MIXED 11/27/2006  . ANGIOEDEMA 11/27/2006   Past Medical History  Diagnosis Date  . Sleep apnea   . Diabetes mellitus     type II  . Hypertension   . Hyperlipidemia   . Stroke 05/2010    Small vessel sobcortical (in [place] trial) with Dr Leonie Man, residual L hemiparesis  . Osteoarthritis   . Angioedema     possibly from voltaren  . Vitamin B 12 deficiency 04/08  . LVH (left ventricular hypertrophy)     and atrial  enlargement by echo in past with nl EF  . Degenerative disc disease   . Osteopenia   . Nasal pruritis   . Renal insufficiency    Past Surgical History  Procedure Laterality Date  . Eye surgery      cataract extraction  . Abdominal hysterectomy      BSO-fibroids  . Knee surgery      arthroscope  . Spine surgery  08/09    spinal decompression surgery  . Retinal detachment surgery  02/18/11    times 2  . Back surgery    . Colon surgery      due to punctured intestines  . Appendectomy    . Pars plana vitrectomy  07/31/2011    Procedure: PARS PLANA VITRECTOMY WITH 25 GAUGE;  Surgeon: [name], MD;  Location: [facility];  Service: Ophthalmology;  Laterality: Right;  REMOVAL OF SILICONE OIL AND LASER RIGHT EYE   History  Substance Use Topics  . Smoking status: Never Smoker   .  Smokeless tobacco: Not on file  . Alcohol Use: No   Family History  Problem Relation Age of Onset  . Cancer Sister     brain tumor with hemmorhage  . Heart disease Sister     CAD  . COPD Brother    Allergies  Allergen Reactions  . Diclofenac Sodium     REACTION: angioedema  . Pioglitazone     REACTION: swelling   Current Outpatient Prescriptions on File Prior to Visit  Medication Sig Dispense Refill  . acetaminophen (TYLENOL) 500 MG tablet Take 500 mg by mouth 2 (two) times daily.      Marland Kitchen amLODipine (NORVASC) 10 MG tablet Take 10 mg by mouth daily.      Marland Kitchen aspirin 325 MG tablet Take 325 mg by mouth daily.       . calcium-vitamin D (OSCAL WITH D) 500-200 MG-UNIT per tablet Take 1 tablet by mouth daily.       . Cholecalciferol (VITAMIN D) 1000 UNITS capsule Take 1,000 Units by mouth daily.       . cyanocobalamin (,VITAMIN B-12,) 1000 MCG/ML injection Inject 1,000 mcg into the muscle every 30 (thirty) days.       . ferrous sulfate 325 (65 FE) MG tablet Take 325 mg by mouth 2 (two) times daily.      Marland Kitchen gemfibrozil (LOPID) 600 MG tablet Take 1 tablet (600 mg total) by mouth daily.  90 tablet  1  . hydrALAZINE (APRESOLINE) 50 MG tablet Take 50 mg by mouth 3 (three) times daily.        . metoprolol succinate (TOPROL-XL) 25 MG 24 hr tablet Take 0.5 tablets (12.5 mg total) by mouth 2 (two) times daily.  90 tablet  1  . mirtazapine (REMERON) 15 MG tablet Take 0.5 tablets (7.5 mg total) by mouth at bedtime.  45 tablet  3  . Multiple Vitamin (MULTIVITAMIN) capsule Take 1 capsule by mouth daily.       . sertraline (ZOLOFT) 50 MG tablet Take 1 tablet (50 mg total) by mouth daily.  90 tablet  3  . trandolapril (MAVIK) 4 MG tablet Take 1 tablet (4 mg total) by mouth daily.  90 tablet  1   No current facility-administered medications on file prior to visit.    Review of Systems Review of Systems  Constitutional: Negative for fever, appetite change, fatigue and unexpected weight change.  Eyes:  Negative for pain and visual disturbance.  Respiratory: Negative for cough and  shortness of breath.   Cardiovascular: Negative for cp or palpitations    Gastrointestinal: Negative for nausea, diarrhea and constipation.  Genitourinary: Negative for urgency and frequency.  Skin: Negative for pallor or rash   Neurological: Negative for , light-headedness, numbness and headaches. pos for baseline weakness from stroke  Hematological: Negative for adenopathy. Does not bruise/bleed easily.  Psychiatric/Behavioral: Negative for dysphoric mood. The patient is not nervous/anxious.         Objective:   Physical Exam  Constitutional: She appears well-developed and well-nourished. No distress.  HENT:  Head: Normocephalic and atraumatic.  Mouth/Throat: Oropharynx is clear and moist.  Eyes: Conjunctivae and EOM are normal. Pupils are equal, round, and reactive to light. No scleral icterus.  Neck: Normal range of motion. Neck supple. No JVD present. Carotid bruit is not present.  Cardiovascular: Normal rate and regular rhythm.   Pulmonary/Chest: Effort normal and breath sounds normal. No respiratory distress. She has no wheezes. She has no rales.  No crackles   Abdominal: Soft. Bowel sounds are normal. She exhibits no distension, no abdominal bruit and no mass. There is no tenderness.  Musculoskeletal: She exhibits no edema.  Lymphadenopathy:    She has no cervical adenopathy.  Neurological: She is alert. She has normal reflexes.  Baseline hemiparesis Walking well with cane today  Skin: Skin is warm and dry. No rash noted. No erythema. No pallor.  Psychiatric: She has a normal mood and affect.          Assessment & Plan:

## 2013-10-23 NOTE — Progress Notes (Signed)
Pre visit review using our clinic review tool, if applicable. No additional management support is needed unless otherwise documented below in the visit note. 

## 2013-10-23 NOTE — Patient Instructions (Signed)
Keep working on the exercise bike to get your strength back- then get outdoors when you can as well  Numbers are all fairly stable  Keep watching diet for sugar and fat  Cholesterol is up a bit (Avoid red meat/ fried foods/ egg yolks/ fatty breakfast meats/ butter, cheese and high fat dairy/ and shellfish  ) For healthy bones -continue calcium and vitamin D - we will continue to watch your bone density Follow up in 6 months for your annual exam with labs prior

## 2013-10-24 ENCOUNTER — Telehealth: Payer: Self-pay | Admitting: Family Medicine

## 2013-10-24 NOTE — Assessment & Plan Note (Signed)
Rev dexa  Pt is not a candidate for fosamax due to renal insuff or evista due to stroke Disc need for calcium/ vitamin D/ wt bearing exercise and bone density test every 2 y to monitor Disc safety/ fracture risk in detail

## 2013-10-24 NOTE — Telephone Encounter (Signed)
Relevant patient education mailed to patient.  

## 2013-10-24 NOTE — Assessment & Plan Note (Signed)
Lab Results  Component Value Date   HGBA1C 6.3 10/21/2013   Very well controlled

## 2013-10-24 NOTE — Assessment & Plan Note (Signed)
bp in fair control at this time  BP Readings from Last 1 Encounters:  10/23/13 116/64   No changes needed Disc lifstyle change with low sodium diet and exercise

## 2013-10-24 NOTE — Assessment & Plan Note (Signed)
Pt has f/u with renal regularly

## 2013-10-24 NOTE — Assessment & Plan Note (Signed)
Disc goals for lipids and reasons to control them Rev labs with pt Rev low sat fat diet in detail   

## 2013-10-26 ENCOUNTER — Telehealth: Payer: Self-pay | Admitting: Family Medicine

## 2013-10-26 NOTE — Telephone Encounter (Signed)
Relevant patient education mailed to patient.  

## 2013-10-27 DIAGNOSIS — N3941 Urge incontinence: Secondary | ICD-10-CM | POA: Diagnosis not present

## 2013-11-17 DIAGNOSIS — N3941 Urge incontinence: Secondary | ICD-10-CM | POA: Diagnosis not present

## 2013-12-15 DIAGNOSIS — N3941 Urge incontinence: Secondary | ICD-10-CM | POA: Diagnosis not present

## 2014-01-01 ENCOUNTER — Other Ambulatory Visit: Payer: Self-pay | Admitting: *Deleted

## 2014-01-01 MED ORDER — TRANDOLAPRIL 4 MG PO TABS: 4.0000 mg | ORAL_TABLET | Freq: Every day | ORAL | Status: DC

## 2014-01-05 DIAGNOSIS — N3941 Urge incontinence: Secondary | ICD-10-CM | POA: Diagnosis not present

## 2014-01-07 ENCOUNTER — Telehealth: Payer: Self-pay | Admitting: Family Medicine

## 2014-01-07 DIAGNOSIS — Z7409 Other reduced mobility: Secondary | ICD-10-CM

## 2014-01-07 DIAGNOSIS — I69959 Hemiplegia and hemiparesis following unspecified cerebrovascular disease affecting unspecified side: Secondary | ICD-10-CM

## 2014-01-07 NOTE — Telephone Encounter (Signed)
I called Renee [name] and she said you need to do a PT referral to someone like [facility].

## 2014-01-07 NOTE — Telephone Encounter (Signed)
So they need a written order on a px ? - I will have to do that in office tomorrow  Thanks -send this back to me as a reminder

## 2014-01-07 NOTE — Telephone Encounter (Signed)
Renee [name] received the order for patient.  They are not in Epic.  They need a referral done through the office.  Patient needs PT for left arm and left side.  Increased weakness in  Left hand.  Patient's gait is unsteady. They are trying to get in a care giver  for her.  PT might help with strengthening and preserve her functioning.  She's normally very active, but there is a decrease in activity for her.  It's harder for her to do the things she enjoyed.

## 2014-01-08 NOTE — Telephone Encounter (Signed)
Order is in IN box 

## 2014-01-13 DIAGNOSIS — N183 Chronic kidney disease, stage 3 unspecified: Secondary | ICD-10-CM | POA: Diagnosis not present

## 2014-01-13 DIAGNOSIS — D500 Iron deficiency anemia secondary to blood loss (chronic): Secondary | ICD-10-CM | POA: Diagnosis not present

## 2014-01-13 DIAGNOSIS — I10 Essential (primary) hypertension: Secondary | ICD-10-CM | POA: Diagnosis not present

## 2014-01-14 NOTE — Telephone Encounter (Signed)
The epic order process for PT is cumbersome and I do not have my directions at home -so I will have to do this tomorrow in the office-please send back to me as a reminder

## 2014-01-14 NOTE — Telephone Encounter (Addendum)
They would like [place] to do referral and get pt set up with PT, Spoke with Bonnita Nasuti and she advise me you just need to pt in a regular referral through EPIC and Marion/Linda would get it set up  (no paper order needed)

## 2014-01-15 NOTE — Addendum Note (Signed)
Addended by: [name] A on: 01/15/2014 01:40 PM   Modules accepted: Orders

## 2014-01-15 NOTE — Telephone Encounter (Signed)
See prev note

## 2014-01-15 NOTE — Telephone Encounter (Signed)
Ref put in

## 2014-01-22 ENCOUNTER — Ambulatory Visit (INDEPENDENT_AMBULATORY_CARE_PROVIDER_SITE_OTHER): Payer: Medicare Other | Admitting: Family Medicine

## 2014-01-22 ENCOUNTER — Encounter: Payer: Self-pay | Admitting: Family Medicine

## 2014-01-22 VITALS — BP 128/70 | HR 61 | Temp 98.0°F | Ht 62.25 in | Wt 175.0 lb

## 2014-01-22 DIAGNOSIS — I69959 Hemiplegia and hemiparesis following unspecified cerebrovascular disease affecting unspecified side: Secondary | ICD-10-CM

## 2014-01-22 DIAGNOSIS — E538 Deficiency of other specified B group vitamins: Secondary | ICD-10-CM | POA: Diagnosis not present

## 2014-01-22 DIAGNOSIS — R69 Illness, unspecified: Secondary | ICD-10-CM | POA: Diagnosis not present

## 2014-01-22 DIAGNOSIS — I10 Essential (primary) hypertension: Secondary | ICD-10-CM | POA: Diagnosis not present

## 2014-01-22 DIAGNOSIS — Z7409 Other reduced mobility: Secondary | ICD-10-CM

## 2014-01-22 MED ORDER — CYANOCOBALAMIN 1000 MCG/ML IJ SOLN
1000.0000 ug | Freq: Once | INTRAMUSCULAR | Status: AC
  Administered 2014-01-22: 1000 ug via INTRAMUSCULAR

## 2014-01-22 NOTE — Assessment & Plan Note (Signed)
Monthly inj today

## 2014-01-22 NOTE — Assessment & Plan Note (Signed)
bp in fair control at this time  BP Readings from Last 1 Encounters:  01/22/14 128/70   No changes needed Disc lifstyle change with low sodium diet and exercise  Per pt was high at Dr Elwyn Lade office recently - she will call in this result from today

## 2014-01-22 NOTE — Patient Instructions (Signed)
I do agree that your upper body is weaker than it was  To prevent strokes - please get back to using your cpap machine every night  To avoid falls please use walker all the time  Until we evaluate you after physcial therapy - I would rather you did not drive Stop at check out for your physical therapy referral

## 2014-01-22 NOTE — Assessment & Plan Note (Signed)
Pt is not using walker or cane at all times This is worrisome given her eval today and inc L sided weakness  I urged strongly to begin using walker at all times

## 2014-01-22 NOTE — Progress Notes (Signed)
Subjective:    Patient ID: Renee [name], female    DOB: [birth year], 80 y.o.   MRN: [id number]  HPI Here for a face to face visit to refer for home health/ PT   She was doing well for a while  Has been caring for her husband with shingles since May  He also has severe depression  Pt states that her L sided weakness is worse in the past 3 months  Enough to impair her ability to do day to day tasks   Notes less strength in L shoulder/hand and L arm  Grip has decreased and "I drop everything"  L leg seems to be ok - about the same  No falls  She uses her walker some and her cane some of the time  And she gets around without it some of the time   CVA in the past  ? Due to sleep apnea  Not using her cpap since she has to get up so often at night   Does not have any headache or numbness Her speech is baseline  No confusion or change in her memory- she still does the home finances   She drives to the beauty shop to get her hair done and she drives to the drug store and the doctor   She has a caregiver 5 hours per day to help care for her husband  She needs the help  She does some of the cooking  She still needs some help with bathing herself (the hired caregiver helps)  Has been going to the urologist for "shock tx" of the bladder   BP Readings from Last 3 Encounters:  01/22/14 128/70  10/23/13 116/64  04/17/13 130/82   Patient Active Problem List   Diagnosis Date Noted  . Encounter for Medicare annual wellness exam 04/17/2013  . Mobility impaired 06/26/2011  . History of retinal detachment 01/10/2011  . Sleep apnea 11/28/2010  . Depression with anxiety 08/25/2010  . CVA WITH LEFT HEMIPARESIS 07/05/2010  . POSTHERPETIC NEURALGIA 11/09/2009  . OSTEOPENIA 12/20/2008  . MUSCLE WEAKNESS (GENERALIZED) 08/03/2008  . RENAL INSUFFICIENCY 06/29/2008  . BACK PAIN 01/26/2008  . EDEMA 01/26/2008  . B12 DEFICIENCY 01/10/2007  . Hyperglycemia 11/27/2006  . HYPERLIPIDEMIA  11/27/2006  . HYPERTENSION 11/27/2006  . FIBROCYSTIC BREAST DISEASE 11/27/2006  . ROSACEA 11/27/2006  . OSTEOARTHRITIS 11/27/2006  . URINARY INCONTINENCE, MIXED 11/27/2006  . ANGIOEDEMA 11/27/2006   Past Medical History  Diagnosis Date  . Sleep apnea   . Diabetes mellitus     type II  . Hypertension   . Hyperlipidemia   . Stroke 05/2010    Small vessel sobcortical (in [place] trial) with Dr Leonie Man, residual L hemiparesis  . Osteoarthritis   . Angioedema     possibly from voltaren  . Vitamin B 12 deficiency 04/08  . LVH (left ventricular hypertrophy)     and atrial enlargement by echo in past with nl EF  . Degenerative disc disease   . Osteopenia   . Nasal pruritis   . Renal insufficiency    Past Surgical History  Procedure Laterality Date  . Eye surgery      cataract extraction  . Abdominal hysterectomy      BSO-fibroids  . Knee surgery      arthroscope  . Spine surgery  08/09    spinal decompression surgery  . Retinal detachment surgery  02/18/11    times 2  . Back surgery    . Colon  surgery      due to punctured intestines  . Appendectomy    . Pars plana vitrectomy  07/31/2011    Procedure: PARS PLANA VITRECTOMY WITH 25 GAUGE;  Surgeon: [name], MD;  Location: [facility];  Service: Ophthalmology;  Laterality: Right;  REMOVAL OF SILICONE OIL AND LASER RIGHT EYE   History  Substance Use Topics  . Smoking status: Never Smoker   . Smokeless tobacco: Never Used  . Alcohol Use: No   Family History  Problem Relation Age of Onset  . Cancer Sister     brain tumor with hemmorhage  . Heart disease Sister     CAD  . COPD Brother    Allergies  Allergen Reactions  . Diclofenac Sodium     REACTION: angioedema  . Pioglitazone     REACTION: swelling   Current Outpatient Prescriptions on File Prior to Visit  Medication Sig Dispense Refill  . acetaminophen (TYLENOL) 500 MG tablet Take 500 mg by mouth 2 (two) times daily.      Marland Kitchen amLODipine (NORVASC) 10 MG tablet  Take 10 mg by mouth daily.      Marland Kitchen aspirin 325 MG tablet Take 325 mg by mouth daily.       . calcium-vitamin D (OSCAL WITH D) 500-200 MG-UNIT per tablet Take 1 tablet by mouth daily.       . Cholecalciferol (VITAMIN D) 1000 UNITS capsule Take 1,000 Units by mouth daily.       . cyanocobalamin (,VITAMIN B-12,) 1000 MCG/ML injection Inject 1,000 mcg into the muscle every 30 (thirty) days.       . ferrous sulfate 325 (65 FE) MG tablet Take 325 mg by mouth 2 (two) times daily.      Marland Kitchen gemfibrozil (LOPID) 600 MG tablet Take 1 tablet (600 mg total) by mouth daily.  90 tablet  1  . hydrALAZINE (APRESOLINE) 50 MG tablet Take 50 mg by mouth 3 (three) times daily.        . metoprolol succinate (TOPROL-XL) 25 MG 24 hr tablet Take 0.5 tablets (12.5 mg total) by mouth 2 (two) times daily.  90 tablet  1  . mirtazapine (REMERON) 15 MG tablet Take 0.5 tablets (7.5 mg total) by mouth at bedtime.  45 tablet  3  . Multiple Vitamin (MULTIVITAMIN) capsule Take 1 capsule by mouth daily.       . sertraline (ZOLOFT) 50 MG tablet Take 1 tablet (50 mg total) by mouth daily.  90 tablet  3  . trandolapril (MAVIK) 4 MG tablet Take 1 tablet (4 mg total) by mouth daily.  90 tablet  1   No current facility-administered medications on file prior to visit.     Review of Systems Review of Systems  Constitutional: Negative for fever, appetite change, fatigue and unexpected weight change.  Eyes: Negative for pain and visual disturbance.  Respiratory: Negative for cough and shortness of breath.   Cardiovascular: Negative for cp or palpitations    Gastrointestinal: Negative for nausea, diarrhea and constipation.  Genitourinary: Negative for urgency and frequency.  Skin: Negative for pallor or rash   Neurological: Negative for , light-headedness, numbness and headaches. pos for L upper body weakness and generally poor coordination and slowed speech Hematological: Negative for adenopathy. Does not bruise/bleed easily.    Psychiatric/Behavioral: Negative for dysphoric mood. The patient is not nervous/anxious.         Objective:   Physical Exam  Constitutional: She appears well-developed and well-nourished. No distress.  HENT:  Head: Normocephalic and atraumatic.  Eyes: Conjunctivae and EOM are normal. Pupils are equal, round, and reactive to light. No scleral icterus.  Neck: Normal range of motion. Neck supple. No JVD present. Carotid bruit is not present. No thyromegaly present.  Cardiovascular: Normal rate, regular rhythm, normal heart sounds and intact distal pulses.  Exam reveals no gallop.   No murmur heard. Pulmonary/Chest: Effort normal and breath sounds normal.  Abdominal: Soft. Bowel sounds are normal. She exhibits no distension and no mass. There is no tenderness.  Musculoskeletal: She exhibits no edema.  Lymphadenopathy:    She has no cervical adenopathy.  Neurological: She is alert. She has normal reflexes. She displays no atrophy. No cranial nerve deficit or sensory deficit. She exhibits normal muscle tone. Coordination and gait abnormal.  Weakness in LUE 4/5 grip and arm strength and shoulder shrug Baseline facial droop and slow speech that have not changed  Slow unsteady gait without assistance   Skin: Skin is warm and dry. No rash noted. No erythema. No pallor.  Psychiatric: She has a normal mood and affect.          Assessment & Plan:   Problem List Items Addressed This Visit     Cardiovascular and Mediastinum   HYPERTENSION      bp in fair control at this time  BP Readings from Last 1 Encounters:  01/22/14 128/70   No changes needed Disc lifstyle change with low sodium diet and exercise  Per pt was high at Dr Elwyn Lade office recently - she will call in this result from today      Nervous and Auditory   CVA WITH LEFT HEMIPARESIS - Primary     LUE weakness has worsened (3 mo per pt) - and past stroke was thought to have been caused in part by sleep apnea  She stopped  cpap 2-3 mo ago and this correlates I expressed concern re: another cva and inst to get back with cpap Ref for outpt PT to help with L hemiparesis  Inst not to drive until I re eval after PT  inst to use walker at all times (she did not bring it today)    Relevant Orders      Ambulatory referral to Physical Therapy     Other   B12 DEFICIENCY     Monthly inj today    Relevant Medications      cyanocobalamin ((VITAMIN B-12)) injection 1,000 mcg (Completed)   Mobility impaired     Pt is not using walker or cane at all times This is worrisome given her eval today and inc L sided weakness  I urged strongly to begin using walker at all times     Relevant Orders      Ambulatory referral to Physical Therapy

## 2014-01-22 NOTE — Assessment & Plan Note (Signed)
LUE weakness has worsened (3 mo per pt) - and past stroke was thought to have been caused in part by sleep apnea  She stopped cpap 2-3 mo ago and this correlates I expressed concern re: another cva and inst to get back with cpap Ref for outpt PT to help with L hemiparesis  Inst not to drive until I re eval after PT  inst to use walker at all times (she did not bring it today)

## 2014-01-26 DIAGNOSIS — N3941 Urge incontinence: Secondary | ICD-10-CM | POA: Diagnosis not present

## 2014-01-27 ENCOUNTER — Ambulatory Visit (INDEPENDENT_AMBULATORY_CARE_PROVIDER_SITE_OTHER): Payer: Medicare Other | Admitting: Ophthalmology

## 2014-01-27 ENCOUNTER — Other Ambulatory Visit: Payer: Self-pay | Admitting: *Deleted

## 2014-01-27 DIAGNOSIS — E1165 Type 2 diabetes mellitus with hyperglycemia: Secondary | ICD-10-CM

## 2014-01-27 DIAGNOSIS — E11319 Type 2 diabetes mellitus with unspecified diabetic retinopathy without macular edema: Secondary | ICD-10-CM | POA: Diagnosis not present

## 2014-01-27 DIAGNOSIS — H35039 Hypertensive retinopathy, unspecified eye: Secondary | ICD-10-CM

## 2014-01-27 DIAGNOSIS — H3530 Unspecified macular degeneration: Secondary | ICD-10-CM

## 2014-01-27 DIAGNOSIS — I10 Essential (primary) hypertension: Secondary | ICD-10-CM

## 2014-01-27 DIAGNOSIS — H33009 Unspecified retinal detachment with retinal break, unspecified eye: Secondary | ICD-10-CM

## 2014-01-27 DIAGNOSIS — E1139 Type 2 diabetes mellitus with other diabetic ophthalmic complication: Secondary | ICD-10-CM

## 2014-01-27 DIAGNOSIS — H43819 Vitreous degeneration, unspecified eye: Secondary | ICD-10-CM

## 2014-01-27 MED ORDER — GEMFIBROZIL 600 MG PO TABS: 600.0000 mg | ORAL_TABLET | Freq: Every day | ORAL | Status: DC

## 2014-02-09 ENCOUNTER — Encounter: Payer: Self-pay | Admitting: Family Medicine

## 2014-02-09 DIAGNOSIS — I69959 Hemiplegia and hemiparesis following unspecified cerebrovascular disease affecting unspecified side: Secondary | ICD-10-CM | POA: Diagnosis not present

## 2014-02-09 DIAGNOSIS — IMO0001 Reserved for inherently not codable concepts without codable children: Secondary | ICD-10-CM | POA: Diagnosis not present

## 2014-02-09 DIAGNOSIS — M6281 Muscle weakness (generalized): Secondary | ICD-10-CM | POA: Diagnosis not present

## 2014-02-09 DIAGNOSIS — M25619 Stiffness of unspecified shoulder, not elsewhere classified: Secondary | ICD-10-CM | POA: Diagnosis not present

## 2014-02-09 DIAGNOSIS — R262 Difficulty in walking, not elsewhere classified: Secondary | ICD-10-CM | POA: Diagnosis not present

## 2014-02-10 ENCOUNTER — Other Ambulatory Visit: Payer: Self-pay | Admitting: *Deleted

## 2014-02-10 MED ORDER — METOPROLOL SUCCINATE ER 25 MG PO TB24: 12.5000 mg | ORAL_TABLET | Freq: Two times a day (BID) | ORAL | Status: DC

## 2014-02-16 DIAGNOSIS — N3941 Urge incontinence: Secondary | ICD-10-CM | POA: Diagnosis not present

## 2014-02-17 ENCOUNTER — Encounter: Payer: Self-pay | Admitting: Family Medicine

## 2014-02-17 DIAGNOSIS — R262 Difficulty in walking, not elsewhere classified: Secondary | ICD-10-CM | POA: Diagnosis not present

## 2014-02-17 DIAGNOSIS — M25619 Stiffness of unspecified shoulder, not elsewhere classified: Secondary | ICD-10-CM | POA: Diagnosis not present

## 2014-02-17 DIAGNOSIS — I69959 Hemiplegia and hemiparesis following unspecified cerebrovascular disease affecting unspecified side: Secondary | ICD-10-CM | POA: Diagnosis not present

## 2014-02-17 DIAGNOSIS — M6281 Muscle weakness (generalized): Secondary | ICD-10-CM | POA: Diagnosis not present

## 2014-02-17 DIAGNOSIS — IMO0001 Reserved for inherently not codable concepts without codable children: Secondary | ICD-10-CM | POA: Diagnosis not present

## 2014-02-24 ENCOUNTER — Encounter: Payer: Self-pay | Admitting: Family Medicine

## 2014-02-24 ENCOUNTER — Ambulatory Visit (INDEPENDENT_AMBULATORY_CARE_PROVIDER_SITE_OTHER): Payer: Medicare Other | Admitting: Family Medicine

## 2014-02-24 VITALS — BP 124/70 | HR 67 | Temp 98.3°F | Ht 62.25 in | Wt 181.5 lb

## 2014-02-24 DIAGNOSIS — I10 Essential (primary) hypertension: Secondary | ICD-10-CM | POA: Diagnosis not present

## 2014-02-24 DIAGNOSIS — I69959 Hemiplegia and hemiparesis following unspecified cerebrovascular disease affecting unspecified side: Secondary | ICD-10-CM | POA: Diagnosis not present

## 2014-02-24 NOTE — Assessment & Plan Note (Signed)
Symptoms are improving with PT  Another 6 weeks to go Pt feels stronger and more confident  Gait is more steady and no falls  Did stress imp of cpap nightly to prevent more strokes  F/u sept as planned

## 2014-02-24 NOTE — Progress Notes (Signed)
Subjective:    Patient ID: Renee [name], female    DOB: 07-11-1929, 80 y.o.   MRN: [id number]  HPI Here for f/u of weakness from CVA Is going to PT "they are putting through the mill"- going twice per week for 8 weeks or more  Really wears her out  Has had 2 weeks so far  Feels like it is helping a lot   She has not started using CPAP yet  Is walking with her walker at all times -no falls    Already feeling stronger -quite a bit   Saw Dr Holley Raring and renal dz is stable  Is watching bp very closely It is much better today   BP Readings from Last 3 Encounters:  02/24/14 124/70  01/22/14 128/70  10/23/13 116/64    Patient Active Problem List   Diagnosis Date Noted  . Encounter for Medicare annual wellness exam 04/17/2013  . Mobility impaired 06/26/2011  . History of retinal detachment 01/10/2011  . Sleep apnea 11/28/2010  . Depression with anxiety 08/25/2010  . CVA WITH LEFT HEMIPARESIS 07/05/2010  . POSTHERPETIC NEURALGIA 11/09/2009  . OSTEOPENIA 12/20/2008  . MUSCLE WEAKNESS (GENERALIZED) 08/03/2008  . RENAL INSUFFICIENCY 06/29/2008  . BACK PAIN 01/26/2008  . EDEMA 01/26/2008  . B12 DEFICIENCY 01/10/2007  . Hyperglycemia 11/27/2006  . HYPERLIPIDEMIA 11/27/2006  . HYPERTENSION 11/27/2006  . FIBROCYSTIC BREAST DISEASE 11/27/2006  . ROSACEA 11/27/2006  . OSTEOARTHRITIS 11/27/2006  . URINARY INCONTINENCE, MIXED 11/27/2006  . ANGIOEDEMA 11/27/2006   Past Medical History  Diagnosis Date  . Sleep apnea   . Diabetes mellitus     type II  . Hypertension   . Hyperlipidemia   . Stroke 05/2010    Small vessel sobcortical (in [place] trial) with Dr Leonie Man, residual L hemiparesis  . Osteoarthritis   . Angioedema     possibly from voltaren  . Vitamin B 12 deficiency 04/08  . LVH (left ventricular hypertrophy)     and atrial enlargement by echo in past with nl EF  . Degenerative disc disease   . Osteopenia   . Nasal pruritis   . Renal insufficiency    Past Surgical  History  Procedure Laterality Date  . Eye surgery      cataract extraction  . Abdominal hysterectomy      BSO-fibroids  . Knee surgery      arthroscope  . Spine surgery  08/09    spinal decompression surgery  . Retinal detachment surgery  02/18/11    times 2  . Back surgery    . Colon surgery      due to punctured intestines  . Appendectomy    . Pars plana vitrectomy  07/31/2011    Procedure: PARS PLANA VITRECTOMY WITH 25 GAUGE;  Surgeon: [name], MD;  Location: [facility];  Service: Ophthalmology;  Laterality: Right;  REMOVAL OF SILICONE OIL AND LASER RIGHT EYE   History  Substance Use Topics  . Smoking status: Never Smoker   . Smokeless tobacco: Never Used  . Alcohol Use: No   Family History  Problem Relation Age of Onset  . Cancer Sister     brain tumor with hemmorhage  . Heart disease Sister     CAD  . COPD Brother    Allergies  Allergen Reactions  . Diclofenac Sodium     REACTION: angioedema  . Pioglitazone     REACTION: swelling   Current Outpatient Prescriptions on File Prior to Visit  Medication Sig  Dispense Refill  . acetaminophen (TYLENOL) 500 MG tablet Take 500 mg by mouth 2 (two) times daily.      Marland Kitchen amLODipine (NORVASC) 10 MG tablet Take 10 mg by mouth daily.      Marland Kitchen aspirin 325 MG tablet Take 325 mg by mouth daily.       . calcium-vitamin D (OSCAL WITH D) 500-200 MG-UNIT per tablet Take 1 tablet by mouth daily.       . Cholecalciferol (VITAMIN D) 1000 UNITS capsule Take 1,000 Units by mouth daily.       . cyanocobalamin (,VITAMIN B-12,) 1000 MCG/ML injection Inject 1,000 mcg into the muscle every 30 (thirty) days.       . ferrous sulfate 325 (65 FE) MG tablet Take 325 mg by mouth 2 (two) times daily.      Marland Kitchen gemfibrozil (LOPID) 600 MG tablet Take 1 tablet (600 mg total) by mouth daily.  90 tablet  1  . hydrALAZINE (APRESOLINE) 50 MG tablet Take 50 mg by mouth 3 (three) times daily.        . metoprolol succinate (TOPROL-XL) 25 MG 24 hr tablet Take 0.5  tablets (12.5 mg total) by mouth 2 (two) times daily.  90 tablet  1  . mirtazapine (REMERON) 15 MG tablet Take 0.5 tablets (7.5 mg total) by mouth at bedtime.  45 tablet  3  . Multiple Vitamin (MULTIVITAMIN) capsule Take 1 capsule by mouth daily.       . sertraline (ZOLOFT) 50 MG tablet Take 1 tablet (50 mg total) by mouth daily.  90 tablet  3  . trandolapril (MAVIK) 4 MG tablet Take 1 tablet (4 mg total) by mouth daily.  90 tablet  1   No current facility-administered medications on file prior to visit.    Review of Systems Review of Systems  Constitutional: Negative for fever, appetite change,  and unexpected weight change. pos for fatigue that is improving  Eyes: Negative for pain and visual disturbance.  Respiratory: Negative for cough and shortness of breath.   Cardiovascular: Negative for cp or palpitations    Gastrointestinal: Negative for nausea, diarrhea and constipation.  Genitourinary: Negative for urgency and frequency.  Skin: Negative for pallor or rash   Neurological: Negative for , light-headedness, numbness and headaches. pos for baseline weakness that is improving Hematological: Negative for adenopathy. Does not bruise/bleed easily.  Psychiatric/Behavioral: Negative for dysphoric mood. The patient is not nervous/anxious.         Objective:   Physical Exam  Constitutional: She appears well-developed and well-nourished. No distress.  obese and well appearing   HENT:  Head: Normocephalic and atraumatic.  Mouth/Throat: Oropharynx is clear and moist.  Eyes: Conjunctivae and EOM are normal. Pupils are equal, round, and reactive to light.  Neck: Normal range of motion. Neck supple.  Cardiovascular: Normal rate, regular rhythm, normal heart sounds and intact distal pulses.   Pulmonary/Chest: Effort normal and breath sounds normal. No respiratory distress. She has no wheezes. She has no rales.  Musculoskeletal: She exhibits no edema and no tenderness.  Lymphadenopathy:     She has no cervical adenopathy.  Neurological: She is alert. She has normal reflexes. She exhibits normal muscle tone. Coordination normal.  Baseline L hemiparesis - with improvement in strength of LUE that is notable  Facial droop is stable  Speech is baseline slow but overall good   Gait- stable with walker/ no foot drop  More stable today   Skin: Skin is warm and dry. No  rash noted. No erythema. No pallor.  Psychiatric: She has a normal mood and affect.          Assessment & Plan:   Problem List Items Addressed This Visit     Cardiovascular and Mediastinum   HYPERTENSION - Primary     Improved Noted higher at Dr Elwyn Lade office  Stable here       Nervous and Auditory   CVA WITH LEFT HEMIPARESIS     Symptoms are improving with PT  Another 6 weeks to go Pt feels stronger and more confident  Gait is more steady and no falls  Did stress imp of cpap nightly to prevent more strokes  F/u sept as planned

## 2014-02-24 NOTE — Assessment & Plan Note (Signed)
Improved Noted higher at Dr Elwyn Lade office  Stable here

## 2014-02-24 NOTE — Patient Instructions (Signed)
Please try to use your CPAP every night  This will help prevent another stroke  I think you are already doing much better with the physical therapy  Keep up the good work  See you as planned in September

## 2014-03-20 ENCOUNTER — Encounter: Payer: Self-pay | Admitting: Family Medicine

## 2014-03-20 DIAGNOSIS — IMO0001 Reserved for inherently not codable concepts without codable children: Secondary | ICD-10-CM | POA: Diagnosis not present

## 2014-03-20 DIAGNOSIS — M6281 Muscle weakness (generalized): Secondary | ICD-10-CM | POA: Diagnosis not present

## 2014-03-20 DIAGNOSIS — M25619 Stiffness of unspecified shoulder, not elsewhere classified: Secondary | ICD-10-CM | POA: Diagnosis not present

## 2014-03-20 DIAGNOSIS — R262 Difficulty in walking, not elsewhere classified: Secondary | ICD-10-CM | POA: Diagnosis not present

## 2014-03-20 DIAGNOSIS — I69959 Hemiplegia and hemiparesis following unspecified cerebrovascular disease affecting unspecified side: Secondary | ICD-10-CM | POA: Diagnosis not present

## 2014-03-22 ENCOUNTER — Other Ambulatory Visit: Payer: Self-pay | Admitting: *Deleted

## 2014-03-22 MED ORDER — MIRTAZAPINE 15 MG PO TABS: 7.5000 mg | ORAL_TABLET | Freq: Every day | ORAL | Status: DC

## 2014-03-22 NOTE — Telephone Encounter (Signed)
Refill sent to pharmacy as instructed. 

## 2014-03-22 NOTE — Telephone Encounter (Signed)
Fax refill request, please advise  

## 2014-03-22 NOTE — Telephone Encounter (Signed)
Please refill for a year  

## 2014-04-19 ENCOUNTER — Telehealth: Payer: Self-pay | Admitting: Family Medicine

## 2014-04-19 DIAGNOSIS — I10 Essential (primary) hypertension: Secondary | ICD-10-CM

## 2014-04-19 DIAGNOSIS — M899 Disorder of bone, unspecified: Secondary | ICD-10-CM

## 2014-04-19 DIAGNOSIS — E785 Hyperlipidemia, unspecified: Secondary | ICD-10-CM

## 2014-04-19 DIAGNOSIS — M949 Disorder of cartilage, unspecified: Secondary | ICD-10-CM

## 2014-04-19 DIAGNOSIS — R739 Hyperglycemia, unspecified: Secondary | ICD-10-CM

## 2014-04-19 DIAGNOSIS — E538 Deficiency of other specified B group vitamins: Secondary | ICD-10-CM

## 2014-04-19 DIAGNOSIS — N259 Disorder resulting from impaired renal tubular function, unspecified: Secondary | ICD-10-CM

## 2014-04-19 NOTE — Telephone Encounter (Signed)
Message copied by Abner Greenspan on Mon Apr 19, 2014 10:13 PM ------      Message from: Ellamae Sia      Created: Wed Apr 14, 2014 11:52 AM      Regarding: Lab orders for Tuesday, 9.1.15       Patient is scheduled for CPX labs, please order future labs, Thanks , Renee [name]       ------

## 2014-04-20 ENCOUNTER — Other Ambulatory Visit (INDEPENDENT_AMBULATORY_CARE_PROVIDER_SITE_OTHER): Payer: Medicare Other

## 2014-04-20 ENCOUNTER — Encounter: Payer: Self-pay | Admitting: Family Medicine

## 2014-04-20 DIAGNOSIS — R739 Hyperglycemia, unspecified: Secondary | ICD-10-CM

## 2014-04-20 DIAGNOSIS — E538 Deficiency of other specified B group vitamins: Secondary | ICD-10-CM | POA: Diagnosis not present

## 2014-04-20 DIAGNOSIS — E785 Hyperlipidemia, unspecified: Secondary | ICD-10-CM | POA: Diagnosis not present

## 2014-04-20 DIAGNOSIS — M949 Disorder of cartilage, unspecified: Secondary | ICD-10-CM | POA: Diagnosis not present

## 2014-04-20 DIAGNOSIS — R7309 Other abnormal glucose: Secondary | ICD-10-CM

## 2014-04-20 DIAGNOSIS — M899 Disorder of bone, unspecified: Secondary | ICD-10-CM

## 2014-04-20 DIAGNOSIS — I10 Essential (primary) hypertension: Secondary | ICD-10-CM

## 2014-04-20 DIAGNOSIS — N259 Disorder resulting from impaired renal tubular function, unspecified: Secondary | ICD-10-CM | POA: Diagnosis not present

## 2014-04-20 LAB — CBC WITH DIFFERENTIAL/PLATELET
Basophils Absolute: 0 10*3/uL (ref 0.0–0.1)
Basophils Relative: 0.8 % (ref 0.0–3.0)
Eosinophils Absolute: 0.5 10*3/uL (ref 0.0–0.7)
Eosinophils Relative: 7.9 % — ABNORMAL HIGH (ref 0.0–5.0)
HCT: 35.2 % — ABNORMAL LOW (ref 36.0–46.0)
Hemoglobin: 11.8 g/dL — ABNORMAL LOW (ref 12.0–15.0)
Lymphocytes Relative: 26.4 % (ref 12.0–46.0)
Lymphs Abs: 1.5 10*3/uL (ref 0.7–4.0)
MCHC: 33.5 g/dL (ref 30.0–36.0)
MCV: 84.9 fl (ref 78.0–100.0)
Monocytes Absolute: 0.5 10*3/uL (ref 0.1–1.0)
Monocytes Relative: 7.9 % (ref 3.0–12.0)
Neutro Abs: 3.3 10*3/uL (ref 1.4–7.7)
Neutrophils Relative %: 57 % (ref 43.0–77.0)
Platelets: 181 10*3/uL (ref 150.0–400.0)
RBC: 4.14 Mil/uL (ref 3.87–5.11)
RDW: 13.9 % (ref 11.5–15.5)
WBC: 5.9 10*3/uL (ref 4.0–10.5)

## 2014-04-20 LAB — LIPID PANEL
Cholesterol: 185 mg/dL (ref 0–200)
HDL: 34.5 mg/dL — ABNORMAL LOW (ref >39.00)
LDL Cholesterol: 117 mg/dL — ABNORMAL HIGH (ref 0–99)
NonHDL: 150.5
Total CHOL/HDL Ratio: 5
Triglycerides: 169 mg/dL — ABNORMAL HIGH (ref 0.0–149.0)
VLDL: 33.8 mg/dL (ref 0.0–40.0)

## 2014-04-20 LAB — COMPREHENSIVE METABOLIC PANEL
ALT: 13 U/L (ref 0–35)
AST: 19 U/L (ref 0–37)
Albumin: 3.4 g/dL — ABNORMAL LOW (ref 3.5–5.2)
Alkaline Phosphatase: 73 U/L (ref 39–117)
BUN: 24 mg/dL — ABNORMAL HIGH (ref 6–23)
CO2: 29 mEq/L (ref 19–32)
Calcium: 8.9 mg/dL (ref 8.4–10.5)
Chloride: 106 mEq/L (ref 96–112)
Creatinine, Ser: 1.4 mg/dL — ABNORMAL HIGH (ref 0.4–1.2)
GFR: 39.8 mL/min — ABNORMAL LOW (ref >60.00)
Glucose, Bld: 142 mg/dL — ABNORMAL HIGH (ref 70–99)
Potassium: 4.2 mEq/L (ref 3.5–5.1)
Sodium: 139 mEq/L (ref 135–145)
Total Bilirubin: 0.6 mg/dL (ref 0.2–1.2)
Total Protein: 5.8 g/dL — ABNORMAL LOW (ref 6.0–8.3)

## 2014-04-20 LAB — VITAMIN B12: Vitamin B-12: 247 pg/mL (ref 211–911)

## 2014-04-20 LAB — TSH: TSH: 7.24 u[IU]/mL — ABNORMAL HIGH (ref 0.35–4.50)

## 2014-04-20 LAB — VITAMIN D 25 HYDROXY (VIT D DEFICIENCY, FRACTURES): VITD: 49.62 ng/mL (ref 30.00–100.00)

## 2014-04-20 LAB — HEMOGLOBIN A1C: Hgb A1c MFr Bld: 6.5 % (ref 4.6–6.5)

## 2014-04-27 ENCOUNTER — Ambulatory Visit (INDEPENDENT_AMBULATORY_CARE_PROVIDER_SITE_OTHER): Payer: Medicare Other | Admitting: Family Medicine

## 2014-04-27 ENCOUNTER — Encounter: Payer: Self-pay | Admitting: Family Medicine

## 2014-04-27 VITALS — BP 122/72 | HR 53 | Temp 98.0°F | Ht 62.25 in | Wt 178.2 lb

## 2014-04-27 DIAGNOSIS — R7989 Other specified abnormal findings of blood chemistry: Secondary | ICD-10-CM

## 2014-04-27 DIAGNOSIS — R739 Hyperglycemia, unspecified: Secondary | ICD-10-CM

## 2014-04-27 DIAGNOSIS — E538 Deficiency of other specified B group vitamins: Secondary | ICD-10-CM | POA: Diagnosis not present

## 2014-04-27 DIAGNOSIS — I10 Essential (primary) hypertension: Secondary | ICD-10-CM | POA: Diagnosis not present

## 2014-04-27 DIAGNOSIS — M899 Disorder of bone, unspecified: Secondary | ICD-10-CM

## 2014-04-27 DIAGNOSIS — R7309 Other abnormal glucose: Secondary | ICD-10-CM

## 2014-04-27 DIAGNOSIS — N259 Disorder resulting from impaired renal tubular function, unspecified: Secondary | ICD-10-CM

## 2014-04-27 DIAGNOSIS — Z0000 Encounter for general adult medical examination without abnormal findings: Secondary | ICD-10-CM | POA: Diagnosis not present

## 2014-04-27 DIAGNOSIS — E785 Hyperlipidemia, unspecified: Secondary | ICD-10-CM

## 2014-04-27 DIAGNOSIS — Z23 Encounter for immunization: Secondary | ICD-10-CM | POA: Diagnosis not present

## 2014-04-27 DIAGNOSIS — M949 Disorder of cartilage, unspecified: Secondary | ICD-10-CM | POA: Diagnosis not present

## 2014-04-27 MED ORDER — CYANOCOBALAMIN 1000 MCG/ML IJ SOLN
1000.0000 ug | Freq: Once | INTRAMUSCULAR | Status: AC
  Administered 2014-04-27: 1000 ug via INTRAMUSCULAR

## 2014-04-27 NOTE — Progress Notes (Signed)
Subjective:    Patient ID: Renee [name], female    DOB: October 20, 1928, 80 y.o.   MRN: [id number]  HPI I have personally reviewed the Medicare Annual Wellness questionnaire and have noted 1. The patient's medical and social history 2. Their use of alcohol, tobacco or illicit drugs 3. Their current medications and supplements 4. The patient's functional ability including ADL's, fall risks, home safety risks and hearing or visual             impairment. 5. Diet and physical activities 6. Evidence for depression or mood disorders  The patients weight, height, BMI have been recorded in the chart and visual acuity is per eye clinic.  I have made referrals, counseling and provided education to the patient based review of the above and I have provided the pt with a written personalized care plan for preventive services.  Doing pretty well overall  Her PT went very well - almost met her goal - will start back at the first of the year   See scanned forms.  Routine anticipatory guidance given to patient.  See health maintenance. Colon cancer screening 12/09  Breast cancer screening 9/14 - she will schedule that this month  Self breast exam-no lumps  Flu vaccine- will get today  Also due for B12 vaccine  Tetanus vaccine  8/13  Pneumovax 2/07  Zoster vaccine- cannot afford to get   Advance directive - she has a living will and adv dir  Cognitive function addressed- see scanned forms- and if abnormal then additional documentation follows. No problems   PMH and SH reviewed  Meds, vitals, and allergies reviewed.   ROS: See HPI.  Otherwise negative.    Renal insuff  Cr 1.4 - fairly stable   A1C- hyperglycemia 6.5 (was 6.3)- she has been eating too many tomato sandwhiches and will be better   Hyperlipidemia Lab Results  Component Value Date   CHOL 185 04/20/2014   CHOL 200 10/21/2013   CHOL 174 03/31/2013   Lab Results  Component Value Date   HDL 34.50* 04/20/2014   HDL 38.50* 10/21/2013     HDL 37.30* 03/31/2013   Lab Results  Component Value Date   LDLCALC 117* 04/20/2014   LDLCALC 126* 10/21/2013   LDLCALC 114* 03/31/2013   Lab Results  Component Value Date   TRIG 169.0* 04/20/2014   TRIG 176.0* 10/21/2013   TRIG 113.0 03/31/2013   Lab Results  Component Value Date   CHOLHDL 5 04/20/2014   CHOLHDL 5 10/21/2013   CHOLHDL 5 03/31/2013   No results found for this basename: LDLDIRECT   on lopid Do not want to add a second med  Trig are controlled   Lab Results  Component Value Date   TSH 7.24* 04/20/2014   will re check this at next lab in 3 mo No symptoms No goiter   bp is stable today  No cp or palpitations or headaches or edema  No side effects to medicines  BP Readings from Last 3 Encounters:  04/27/14 122/72  02/24/14 124/70  01/22/14 128/70     Due for B12 shot Lab Results  Component Value Date   VITAMINB12 247 04/20/2014    Vit D is 49.62 Good   Renal insuff Lab Results  Component Value Date   CREATININE 1.4* 04/20/2014   Will see Dr Holley Raring for f/u this month  Patient Active Problem List   Diagnosis Date Noted  . Abnormal TSH 04/27/2014  . Encounter for Medicare  annual wellness exam 04/17/2013  . Mobility impaired 06/26/2011  . History of retinal detachment 01/10/2011  . Sleep apnea 11/28/2010  . Depression with anxiety 08/25/2010  . CVA WITH LEFT HEMIPARESIS 07/05/2010  . POSTHERPETIC NEURALGIA 11/09/2009  . OSTEOPENIA 12/20/2008  . MUSCLE WEAKNESS (GENERALIZED) 08/03/2008  . RENAL INSUFFICIENCY 06/29/2008  . BACK PAIN 01/26/2008  . EDEMA 01/26/2008  . B12 DEFICIENCY 01/10/2007  . Hyperglycemia 11/27/2006  . HYPERLIPIDEMIA 11/27/2006  . HYPERTENSION 11/27/2006  . FIBROCYSTIC BREAST DISEASE 11/27/2006  . ROSACEA 11/27/2006  . OSTEOARTHRITIS 11/27/2006  . URINARY INCONTINENCE, MIXED 11/27/2006  . ANGIOEDEMA 11/27/2006   Past Medical History  Diagnosis Date  . Sleep apnea   . Diabetes mellitus     type II  . Hypertension   .  Hyperlipidemia   . Stroke 05/2010    Small vessel sobcortical (in [place] trial) with Dr Leonie Man, residual L hemiparesis  . Osteoarthritis   . Angioedema     possibly from voltaren  . Vitamin B 12 deficiency 04/08  . LVH (left ventricular hypertrophy)     and atrial enlargement by echo in past with nl EF  . Degenerative disc disease   . Osteopenia   . Nasal pruritis   . Renal insufficiency    Past Surgical History  Procedure Laterality Date  . Eye surgery      cataract extraction  . Abdominal hysterectomy      BSO-fibroids  . Knee surgery      arthroscope  . Spine surgery  08/09    spinal decompression surgery  . Retinal detachment surgery  02/18/11    times 2  . Back surgery    . Colon surgery      due to punctured intestines  . Appendectomy    . Pars plana vitrectomy  07/31/2011    Procedure: PARS PLANA VITRECTOMY WITH 25 GAUGE;  Surgeon: [name], MD;  Location: [facility];  Service: Ophthalmology;  Laterality: Right;  REMOVAL OF SILICONE OIL AND LASER RIGHT EYE   History  Substance Use Topics  . Smoking status: Never Smoker   . Smokeless tobacco: Never Used  . Alcohol Use: No   Family History  Problem Relation Age of Onset  . Cancer Sister     brain tumor with hemmorhage  . Heart disease Sister     CAD  . COPD Brother    Allergies  Allergen Reactions  . Diclofenac Sodium     REACTION: angioedema  . Pioglitazone     REACTION: swelling   Current Outpatient Prescriptions on File Prior to Visit  Medication Sig Dispense Refill  . acetaminophen (TYLENOL) 500 MG tablet Take 500 mg by mouth 2 (two) times daily.      Marland Kitchen amLODipine (NORVASC) 10 MG tablet Take 10 mg by mouth daily.      Marland Kitchen aspirin 325 MG tablet Take 325 mg by mouth daily.       . calcium-vitamin D (OSCAL WITH D) 500-200 MG-UNIT per tablet Take 1 tablet by mouth daily.       . Cholecalciferol (VITAMIN D) 1000 UNITS capsule Take 1,000 Units by mouth daily.       . cyanocobalamin (,VITAMIN B-12,) 1000  MCG/ML injection Inject 1,000 mcg into the muscle every 30 (thirty) days.       . ferrous sulfate 325 (65 FE) MG tablet Take 325 mg by mouth 2 (two) times daily.      Marland Kitchen gemfibrozil (LOPID) 600 MG tablet Take 1 tablet (600  mg total) by mouth daily.  90 tablet  1  . hydrALAZINE (APRESOLINE) 50 MG tablet Take 50 mg by mouth 3 (three) times daily.        . metoprolol succinate (TOPROL-XL) 25 MG 24 hr tablet Take 0.5 tablets (12.5 mg total) by mouth 2 (two) times daily.  90 tablet  1  . mirtazapine (REMERON) 15 MG tablet Take 0.5 tablets (7.5 mg total) by mouth at bedtime.  45 tablet  3  . Multiple Vitamin (MULTIVITAMIN) capsule Take 1 capsule by mouth daily.       . sertraline (ZOLOFT) 50 MG tablet Take 1 tablet (50 mg total) by mouth daily.  90 tablet  3  . trandolapril (MAVIK) 4 MG tablet Take 1 tablet (4 mg total) by mouth daily.  90 tablet  1   No current facility-administered medications on file prior to visit.    Review of Systems     Objective:   Physical Exam  Constitutional: She appears well-developed and well-nourished. No distress.  obese and well appearing   HENT:  Head: Normocephalic and atraumatic.  Right Ear: External ear normal.  Left Ear: External ear normal.  Mouth/Throat: Oropharynx is clear and moist.  Eyes: Conjunctivae and EOM are normal. Pupils are equal, round, and reactive to light. No scleral icterus.  Neck: Normal range of motion. Neck supple. No JVD present. Carotid bruit is not present. No thyromegaly present.  Cardiovascular: Normal rate, regular rhythm, normal heart sounds and intact distal pulses.  Exam reveals no gallop.   Pulmonary/Chest: Effort normal and breath sounds normal. No respiratory distress. She has no wheezes. She exhibits no tenderness.  Abdominal: Soft. Bowel sounds are normal. She exhibits no distension, no abdominal bruit and no mass. There is no tenderness.  Genitourinary: No breast swelling, tenderness, discharge or bleeding.  Breast exam:  No mass, nodules, thickening, tenderness, bulging, retraction, inflamation, nipple discharge or skin changes noted.  No axillary or clavicular LA.      Musculoskeletal: Normal range of motion. She exhibits no edema and no tenderness.  Lymphadenopathy:    She has no cervical adenopathy.  Neurological: She is alert. She has normal reflexes. No cranial nerve deficit. She exhibits normal muscle tone. Coordination abnormal.  Baseline hemiparesis Getting around well after PT  Skin: Skin is warm and dry. No rash noted. No erythema. No pallor.  Psychiatric: She has a normal mood and affect.          Assessment & Plan:   Problem List Items Addressed This Visit     Cardiovascular and Mediastinum   HYPERTENSION      bp in fair control at this time  BP Readings from Last 1 Encounters:  04/27/14 122/72   No changes needed Disc lifstyle change with low sodium diet and exercise  Labs reviewed       Musculoskeletal and Integument   OSTEOPENIA     Disc need for calcium/ vitamin D/ wt bearing exercise and bone density test every 2 y to monitor Disc safety/ fracture risk in detail         Genitourinary   RENAL INSUFFICIENCY      Lab Results  Component Value Date   CREATININE 1.4* 04/20/2014   Will continue f/u with Dr Holley Raring  Avoidance of nsaid reviewed and imp of hydration       Other   Hyperglycemia      Lab Results  Component Value Date   HGBA1C 6.5 04/20/2014   Up a bit  due to more bread/tom sandwhiches  Will watch diet Re check in 3 mo     B12 DEFICIENCY      Lab Results  Component Value Date   VITAMINB12 247 04/20/2014   B12 shot today-enc to stay on schedule     Relevant Medications      cyanocobalamin ((VITAMIN B-12)) injection 1,000 mcg (Completed)   HYPERLIPIDEMIA     Disc goals for lipids and reasons to control them Rev labs with pt Rev low sat fat diet in detail  On lopid-do not want to add another med at this time     Encounter for Medicare annual  wellness exam - Primary     Reviewed health habits including diet and exercise and skin cancer prevention Reviewed appropriate screening tests for age  Also reviewed health mt list, fam hx and immunization status , as well as social and family history   See HPI Labs reviewed  imms given    Abnormal TSH     Mildly elevated No clinical changes Will re check this in 3 mo     Other Visit Diagnoses   Need for prophylactic vaccination and inoculation against influenza        Relevant Orders       Flu Vaccine QUAD 36+ mos PF IM (Fluarix Quad PF) (Completed)    Need for vaccination with 13-polyvalent pneumococcal conjugate vaccine        Relevant Orders       Pneumococcal conjugate vaccine 13-valent (Completed)

## 2014-04-27 NOTE — Patient Instructions (Signed)
Flu shot today prevnar today (pneumonia vaccine booster) B12 shot today  Don't forget to schedule your mammogram  Schedule non fasting labs in 3 months

## 2014-04-27 NOTE — Progress Notes (Signed)
Pre visit review using our clinic review tool, if applicable. No additional management support is needed unless otherwise documented below in the visit note. 

## 2014-04-28 NOTE — Assessment & Plan Note (Signed)
Lab Results  Component Value Date   VITAMINB12 247 04/20/2014   B12 shot today-enc to stay on schedule

## 2014-04-28 NOTE — Assessment & Plan Note (Signed)
Lab Results  Component Value Date   HGBA1C 6.5 04/20/2014   Up a bit due to more bread/tom sandwhiches  Will watch diet Re check in 3 mo

## 2014-04-28 NOTE — Assessment & Plan Note (Signed)
bp in fair control at this time  BP Readings from Last 1 Encounters:  04/27/14 122/72   No changes needed Disc lifstyle change with low sodium diet and exercise  Labs reviewed

## 2014-04-28 NOTE — Assessment & Plan Note (Signed)
Disc need for calcium/ vitamin D/ wt bearing exercise and bone density test every 2 y to monitor Disc safety/ fracture risk in detail

## 2014-04-28 NOTE — Assessment & Plan Note (Signed)
Reviewed health habits including diet and exercise and skin cancer prevention Reviewed appropriate screening tests for age  Also reviewed health mt list, fam hx and immunization status , as well as social and family history   See HPI Labs reviewed  imms given

## 2014-04-28 NOTE — Assessment & Plan Note (Signed)
Disc goals for lipids and reasons to control them Rev labs with pt Rev low sat fat diet in detail  On lopid-do not want to add another med at this time

## 2014-04-28 NOTE — Assessment & Plan Note (Signed)
Mildly elevated No clinical changes Will re check this in 3 mo

## 2014-04-28 NOTE — Assessment & Plan Note (Signed)
Lab Results  Component Value Date   CREATININE 1.4* 04/20/2014   Will continue f/u with Dr Holley Raring  Avoidance of nsaid reviewed and imp of hydration

## 2014-05-03 DIAGNOSIS — Z1231 Encounter for screening mammogram for malignant neoplasm of breast: Secondary | ICD-10-CM | POA: Diagnosis not present

## 2014-05-04 ENCOUNTER — Encounter: Payer: Self-pay | Admitting: Family Medicine

## 2014-05-05 ENCOUNTER — Encounter: Payer: Self-pay | Admitting: *Deleted

## 2014-05-05 DIAGNOSIS — I10 Essential (primary) hypertension: Secondary | ICD-10-CM | POA: Diagnosis not present

## 2014-05-05 DIAGNOSIS — D500 Iron deficiency anemia secondary to blood loss (chronic): Secondary | ICD-10-CM | POA: Diagnosis not present

## 2014-05-05 DIAGNOSIS — N183 Chronic kidney disease, stage 3 unspecified: Secondary | ICD-10-CM | POA: Diagnosis not present

## 2014-05-31 ENCOUNTER — Encounter: Payer: Self-pay | Admitting: Family Medicine

## 2014-05-31 ENCOUNTER — Ambulatory Visit (INDEPENDENT_AMBULATORY_CARE_PROVIDER_SITE_OTHER): Payer: Medicare Other | Admitting: Family Medicine

## 2014-05-31 VITALS — BP 124/68 | HR 61 | Temp 98.3°F | Ht 62.25 in | Wt 180.5 lb

## 2014-05-31 DIAGNOSIS — R509 Fever, unspecified: Secondary | ICD-10-CM | POA: Diagnosis not present

## 2014-05-31 DIAGNOSIS — N3000 Acute cystitis without hematuria: Secondary | ICD-10-CM | POA: Diagnosis not present

## 2014-05-31 DIAGNOSIS — N390 Urinary tract infection, site not specified: Secondary | ICD-10-CM | POA: Insufficient documentation

## 2014-05-31 LAB — POCT URINALYSIS DIPSTICK
Bilirubin, UA: NEGATIVE
Glucose, UA: NEGATIVE
Nitrite, UA: POSITIVE
Spec Grav, UA: 1.02
Urobilinogen, UA: 1
pH, UA: 5.5

## 2014-05-31 MED ORDER — CIPROFLOXACIN HCL 250 MG PO TABS: 250.0000 mg | ORAL_TABLET | Freq: Two times a day (BID) | ORAL | Status: DC

## 2014-05-31 NOTE — Progress Notes (Signed)
Subjective:    Patient ID: Renee [name], female    DOB: May 27, 1929, 80 y.o.   MRN: [id number]  HPI Has been sick since Friday afternoon - started with chills and then sweats  This am had a fever - 100 at most   achey all over  N/v as well  Last vomiting was yesterday  No diarrhea at all  A little dizzy also   (of note -she had vertigo 2 weeks ago and took motion sickness med)   ua is pos today   No burning to urinate No frequency  No blood in urine   Results for orders placed in visit on 05/31/14  POCT URINALYSIS DIPSTICK      Result Value Ref Range   Color, UA amber     Clarity, UA Cloudy     Glucose, UA neg.     Bilirubin, UA neg.     Ketones, UA Trace     Spec Grav, UA 1.020     Blood, UA Trace     pH, UA 5.5     Protein, UA 100+     Urobilinogen, UA 1.0     Nitrite, UA Positive     Leukocytes, UA moderate (2+)        Patient Active Problem List   Diagnosis Date Noted  . UTI (urinary tract infection) 05/31/2014  . Abnormal TSH 04/27/2014  . Encounter for Medicare annual wellness exam 04/17/2013  . Mobility impaired 06/26/2011  . History of retinal detachment 01/10/2011  . Sleep apnea 11/28/2010  . Depression with anxiety 08/25/2010  . CVA WITH LEFT HEMIPARESIS 07/05/2010  . POSTHERPETIC NEURALGIA 11/09/2009  . OSTEOPENIA 12/20/2008  . MUSCLE WEAKNESS (GENERALIZED) 08/03/2008  . RENAL INSUFFICIENCY 06/29/2008  . BACK PAIN 01/26/2008  . EDEMA 01/26/2008  . B12 DEFICIENCY 01/10/2007  . Hyperglycemia 11/27/2006  . HYPERLIPIDEMIA 11/27/2006  . HYPERTENSION 11/27/2006  . FIBROCYSTIC BREAST DISEASE 11/27/2006  . ROSACEA 11/27/2006  . OSTEOARTHRITIS 11/27/2006  . URINARY INCONTINENCE, MIXED 11/27/2006  . ANGIOEDEMA 11/27/2006   Past Medical History  Diagnosis Date  . Sleep apnea   . Diabetes mellitus     type II  . Hypertension   . Hyperlipidemia   . Stroke 05/2010    Small vessel sobcortical (in [place] trial) with Dr Leonie Man, residual L  hemiparesis  . Osteoarthritis   . Angioedema     possibly from voltaren  . Vitamin B 12 deficiency 04/08  . LVH (left ventricular hypertrophy)     and atrial enlargement by echo in past with nl EF  . Degenerative disc disease   . Osteopenia   . Nasal pruritis   . Renal insufficiency    Past Surgical History  Procedure Laterality Date  . Eye surgery      cataract extraction  . Abdominal hysterectomy      BSO-fibroids  . Knee surgery      arthroscope  . Spine surgery  08/09    spinal decompression surgery  . Retinal detachment surgery  02/18/11    times 2  . Back surgery    . Colon surgery      due to punctured intestines  . Appendectomy    . Pars plana vitrectomy  07/31/2011    Procedure: PARS PLANA VITRECTOMY WITH 25 GAUGE;  Surgeon: [name], MD;  Location: [facility];  Service: Ophthalmology;  Laterality: Right;  REMOVAL OF SILICONE OIL AND LASER RIGHT EYE   History  Substance Use Topics  .  Smoking status: Never Smoker   . Smokeless tobacco: Never Used  . Alcohol Use: No   Family History  Problem Relation Age of Onset  . Cancer Sister     brain tumor with hemmorhage  . Heart disease Sister     CAD  . COPD Brother    Allergies  Allergen Reactions  . Diclofenac Sodium     REACTION: angioedema  . Pioglitazone     REACTION: swelling   Current Outpatient Prescriptions on File Prior to Visit  Medication Sig Dispense Refill  . acetaminophen (TYLENOL) 500 MG tablet Take 500 mg by mouth 2 (two) times daily.      Marland Kitchen amLODipine (NORVASC) 10 MG tablet Take 10 mg by mouth daily.      Marland Kitchen aspirin 325 MG tablet Take 325 mg by mouth daily.       . calcium-vitamin D (OSCAL WITH D) 500-200 MG-UNIT per tablet Take 1 tablet by mouth daily.       . Cholecalciferol (VITAMIN D) 1000 UNITS capsule Take 1,000 Units by mouth daily.       . cyanocobalamin (,VITAMIN B-12,) 1000 MCG/ML injection Inject 1,000 mcg into the muscle every 30 (thirty) days.       . ferrous sulfate 325 (65 FE)  MG tablet Take 325 mg by mouth 2 (two) times daily.      Marland Kitchen gemfibrozil (LOPID) 600 MG tablet Take 1 tablet (600 mg total) by mouth daily.  90 tablet  1  . hydrALAZINE (APRESOLINE) 50 MG tablet Take 50 mg by mouth 3 (three) times daily.        . metoprolol succinate (TOPROL-XL) 25 MG 24 hr tablet Take 0.5 tablets (12.5 mg total) by mouth 2 (two) times daily.  90 tablet  1  . mirtazapine (REMERON) 15 MG tablet Take 0.5 tablets (7.5 mg total) by mouth at bedtime.  45 tablet  3  . Multiple Vitamin (MULTIVITAMIN) capsule Take 1 capsule by mouth daily.       . trandolapril (MAVIK) 4 MG tablet Take 1 tablet (4 mg total) by mouth daily.  90 tablet  1  . sertraline (ZOLOFT) 50 MG tablet Take 1 tablet (50 mg total) by mouth daily.  90 tablet  3   No current facility-administered medications on file prior to visit.    Review of Systems Review of Systems  Constitutional: Negative for , appetite change,  and unexpected weight change. pos for malaise  Eyes: Negative for pain and visual disturbance.  Respiratory: Negative for cough and shortness of breath.   Cardiovascular: Negative for cp or palpitations    Gastrointestinal: Negative for nausea, diarrhea and constipation.  Genitourinary: Negative for urgency and frequency.neg for dysuria or hematuria or flank pain   Skin: Negative for pallor or rash   Neurological: Negative for weakness, light-headedness, numbness and headaches.  Hematological: Negative for adenopathy. Does not bruise/bleed easily.  Psychiatric/Behavioral: Negative for dysphoric mood. The patient is not nervous/anxious.         Objective:   Physical Exam  Constitutional: She appears well-developed and well-nourished. No distress.  obese and well appearing   HENT:  Head: Normocephalic and atraumatic.  Mouth/Throat: Oropharynx is clear and moist.  Eyes: Conjunctivae and EOM are normal. Pupils are equal, round, and reactive to light. Right eye exhibits no discharge. Left eye exhibits  no discharge. No scleral icterus.  Neck: Normal range of motion. Neck supple.  Cardiovascular: Normal rate, regular rhythm and normal heart sounds.   Pulmonary/Chest: Effort  normal and breath sounds normal. No respiratory distress. She has no wheezes. She has no rales.  Abdominal: Soft. Bowel sounds are normal. She exhibits no distension and no mass. There is tenderness. There is no rebound and no guarding.  Mild suprapubic tenderness Very mild bilat cva tenderness (however pt is tender all over)    Musculoskeletal: She exhibits no edema.  Lymphadenopathy:    She has no cervical adenopathy.  Neurological: She is alert.  Skin: Skin is warm and dry. No rash noted. No erythema.  Psychiatric: She has a normal mood and affect.         Assessment & Plan:   Problem List Items Addressed This Visit     Genitourinary   UTI (urinary tract infection) - Primary     In pt with hx of renal insuff tx with low dose cipro Is pushing fluids  ucx pending inst to call or go to ER if suddenly worse or intractable vomiting  Reassuring exam today    Relevant Orders      Urine culture    Other Visit Diagnoses   Fever and chills        Relevant Orders       POCT urinalysis dipstick (Completed)

## 2014-05-31 NOTE — Progress Notes (Signed)
Pre visit review using our clinic review tool, if applicable. No additional management support is needed unless otherwise documented below in the visit note. 

## 2014-05-31 NOTE — Patient Instructions (Signed)
Drink lots of water  Take the cipro as directed for urinary infection  If symptoms worsen - let us know / if after hours go to the ER or urgent care  We will call with result of your urine culture  Go to the pharmacy and get your antibiotic now

## 2014-05-31 NOTE — Assessment & Plan Note (Signed)
In pt with hx of renal insuff tx with low dose cipro Is pushing fluids  ucx pending inst to call or go to ER if suddenly worse or intractable vomiting  Reassuring exam today

## 2014-06-02 LAB — URINE CULTURE: Colony Count: >=100000

## 2014-06-03 ENCOUNTER — Telehealth: Payer: Self-pay

## 2014-06-03 NOTE — Telephone Encounter (Signed)
Pt request refill Mavik to [facility]; advised pt done 01/01/14 with refill; pt will contact pharmacy.

## 2014-06-05 ENCOUNTER — Other Ambulatory Visit: Payer: Self-pay | Admitting: Family Medicine

## 2014-06-07 NOTE — Telephone Encounter (Signed)
Please refill for a year  

## 2014-06-07 NOTE — Telephone Encounter (Signed)
Electronic refill request, please advise  

## 2014-06-07 NOTE — Telephone Encounter (Signed)
done

## 2014-06-12 ENCOUNTER — Other Ambulatory Visit: Payer: Self-pay | Admitting: Family Medicine

## 2014-06-18 ENCOUNTER — Encounter: Payer: Self-pay | Admitting: Family Medicine

## 2014-06-18 ENCOUNTER — Ambulatory Visit (INDEPENDENT_AMBULATORY_CARE_PROVIDER_SITE_OTHER): Payer: Medicare Other | Admitting: Family Medicine

## 2014-06-18 ENCOUNTER — Telehealth: Payer: Self-pay | Admitting: Family Medicine

## 2014-06-18 VITALS — BP 122/76 | HR 59 | Temp 97.8°F | Ht 62.25 in | Wt 177.8 lb

## 2014-06-18 DIAGNOSIS — N3000 Acute cystitis without hematuria: Secondary | ICD-10-CM | POA: Diagnosis not present

## 2014-06-18 DIAGNOSIS — M545 Low back pain: Secondary | ICD-10-CM

## 2014-06-18 DIAGNOSIS — G4730 Sleep apnea, unspecified: Secondary | ICD-10-CM

## 2014-06-18 LAB — POCT URINALYSIS DIPSTICK
Bilirubin, UA: NEGATIVE
Blood, UA: NEGATIVE
Glucose, UA: NEGATIVE
Ketones, UA: NEGATIVE
Nitrite, UA: NEGATIVE
Protein, UA: NEGATIVE
Spec Grav, UA: 1.025
Urobilinogen, UA: 0.2
pH, UA: 6

## 2014-06-18 NOTE — Telephone Encounter (Signed)
Rena-  Dr. Glori Bickers request pt get B-12 after Nov 10th. Could you please look at nurse schedule and see where I can put her please. She would like afternoon appt.

## 2014-06-18 NOTE — Patient Instructions (Signed)
Your urine looks clear  I'm glad you are feeling better  Continue using your cpap  Make an appt after Nov 10th for B12 shot

## 2014-06-18 NOTE — Assessment & Plan Note (Signed)
Feeling so much better with cpap  Thrilled !

## 2014-06-18 NOTE — Progress Notes (Signed)
Pre visit review using our clinic review tool, if applicable. No additional management support is needed unless otherwise documented below in the visit note. 

## 2014-06-18 NOTE — Assessment & Plan Note (Signed)
ua clear after tx of e coli with cipro  Urine symptoms better Back almost totally better  Will continue better water intake and follow

## 2014-06-18 NOTE — Progress Notes (Signed)
Subjective:    Patient ID: Renee [name], female    DOB: 1928/12/24, 80 y.o.   MRN: [id number]  HPI Here for f/u of uti   Took all the cipro  cx was pos for ecoli sens to cipro  Results for orders placed in visit on 06/18/14  POCT URINALYSIS DIPSTICK      Result Value Ref Range   Color, UA yellow     Clarity, UA hazy     Glucose, UA neg.     Bilirubin, UA neg.     Ketones, UA neg.     Spec Grav, UA 1.025     Blood, UA neg.     pH, UA 6.0     Protein, UA neg.     Urobilinogen, UA 0.2     Nitrite, UA neg.     Leukocytes, UA Trace      Feeling better  No more urinary symptoms  Back does not hurt much at all   Also using cpap machine  This really helps with energy  And she is used to it already   Drinking a lot of water   Using walker all the time   Patient Active Problem List   Diagnosis Date Noted  . UTI (urinary tract infection) 05/31/2014  . Abnormal TSH 04/27/2014  . Encounter for Medicare annual wellness exam 04/17/2013  . Mobility impaired 06/26/2011  . History of retinal detachment 01/10/2011  . Sleep apnea 11/28/2010  . Depression with anxiety 08/25/2010  . CVA WITH LEFT HEMIPARESIS 07/05/2010  . POSTHERPETIC NEURALGIA 11/09/2009  . OSTEOPENIA 12/20/2008  . MUSCLE WEAKNESS (GENERALIZED) 08/03/2008  . RENAL INSUFFICIENCY 06/29/2008  . BACK PAIN 01/26/2008  . EDEMA 01/26/2008  . B12 DEFICIENCY 01/10/2007  . Hyperglycemia 11/27/2006  . HYPERLIPIDEMIA 11/27/2006  . HYPERTENSION 11/27/2006  . FIBROCYSTIC BREAST DISEASE 11/27/2006  . ROSACEA 11/27/2006  . OSTEOARTHRITIS 11/27/2006  . URINARY INCONTINENCE, MIXED 11/27/2006  . ANGIOEDEMA 11/27/2006   Past Medical History  Diagnosis Date  . Sleep apnea   . Diabetes mellitus     type II  . Hypertension   . Hyperlipidemia   . Stroke 05/2010    Small vessel sobcortical (in [place] trial) with Dr Leonie Man, residual L hemiparesis  . Osteoarthritis   . Angioedema     possibly from voltaren  . Vitamin B  12 deficiency 04/08  . LVH (left ventricular hypertrophy)     and atrial enlargement by echo in past with nl EF  . Degenerative disc disease   . Osteopenia   . Nasal pruritis   . Renal insufficiency    Past Surgical History  Procedure Laterality Date  . Eye surgery      cataract extraction  . Abdominal hysterectomy      BSO-fibroids  . Knee surgery      arthroscope  . Spine surgery  08/09    spinal decompression surgery  . Retinal detachment surgery  02/18/11    times 2  . Back surgery    . Colon surgery      due to punctured intestines  . Appendectomy    . Pars plana vitrectomy  07/31/2011    Procedure: PARS PLANA VITRECTOMY WITH 25 GAUGE;  Surgeon: [name], MD;  Location: [facility];  Service: Ophthalmology;  Laterality: Right;  REMOVAL OF SILICONE OIL AND LASER RIGHT EYE   History  Substance Use Topics  . Smoking status: Never Smoker   . Smokeless tobacco: Never Used  . Alcohol  Use: No   Family History  Problem Relation Age of Onset  . Cancer Sister     brain tumor with hemmorhage  . Heart disease Sister     CAD  . COPD Brother    Allergies  Allergen Reactions  . Diclofenac Sodium     REACTION: angioedema  . Pioglitazone     REACTION: swelling   Current Outpatient Prescriptions on File Prior to Visit  Medication Sig Dispense Refill  . acetaminophen (TYLENOL) 500 MG tablet Take 500 mg by mouth 2 (two) times daily.      Marland Kitchen amLODipine (NORVASC) 10 MG tablet Take 10 mg by mouth daily.      Marland Kitchen aspirin 325 MG tablet Take 325 mg by mouth daily.       . calcium-vitamin D (OSCAL WITH D) 500-200 MG-UNIT per tablet Take 1 tablet by mouth daily.       . Cholecalciferol (VITAMIN D) 1000 UNITS capsule Take 1,000 Units by mouth daily.       . cyanocobalamin (,VITAMIN B-12,) 1000 MCG/ML injection Inject 1,000 mcg into the muscle every 30 (thirty) days.       . ferrous sulfate 325 (65 FE) MG tablet Take 325 mg by mouth 2 (two) times daily.      Marland Kitchen gemfibrozil (LOPID) 600 MG  tablet TAKE 1 TABLET EVERY DAY  90 tablet  1  . hydrALAZINE (APRESOLINE) 50 MG tablet Take 50 mg by mouth 3 (three) times daily.        . metoprolol succinate (TOPROL-XL) 25 MG 24 hr tablet Take 0.5 tablets (12.5 mg total) by mouth 2 (two) times daily.  90 tablet  1  . mirtazapine (REMERON) 15 MG tablet Take 0.5 tablets (7.5 mg total) by mouth at bedtime.  45 tablet  3  . Multiple Vitamin (MULTIVITAMIN) capsule Take 1 capsule by mouth daily.       . sertraline (ZOLOFT) 50 MG tablet TAKE 1 TABLET EVERY DAY  90 tablet  3  . trandolapril (MAVIK) 4 MG tablet Take 1 tablet (4 mg total) by mouth daily.  90 tablet  1   No current facility-administered medications on file prior to visit.     Review of Systems Review of Systems  Constitutional: Negative for fever, appetite change, fatigue and unexpected weight change.  Eyes: Negative for pain and visual disturbance.  Respiratory: Negative for cough and shortness of breath.   Cardiovascular: Negative for cp or palpitations    Gastrointestinal: Negative for nausea, diarrhea and constipation.  Genitourinary: Negative for urgency and frequency. neg for dysuria or hematuria  Skin: Negative for pallor or rash   Neurological: Negative for weakness, light-headedness, numbness and headaches.  Hematological: Negative for adenopathy. Does not bruise/bleed easily.  Psychiatric/Behavioral: Negative for dysphoric mood. The patient is not nervous/anxious.         Objective:   Physical Exam  Constitutional: She appears well-developed and well-nourished. No distress.  HENT:  Head: Normocephalic and atraumatic.  Mouth/Throat: Oropharynx is clear and moist.  Eyes: Conjunctivae and EOM are normal. Pupils are equal, round, and reactive to light.  Neck: Normal range of motion. Neck supple.  Pulmonary/Chest: Effort normal and breath sounds normal. No respiratory distress. She has no wheezes. She has no rales.  Abdominal: Soft. Bowel sounds are normal. She  exhibits no distension and no mass. There is no tenderness.  No suprapubic tenderness or fullness   No cva tenderness   Musculoskeletal: She exhibits no edema.  Lymphadenopathy:  She has no cervical adenopathy.  Neurological: She is alert. She has normal reflexes.  Baseline hemiplegia Getting stronger   Skin: Skin is warm and dry. No rash noted. No erythema. No pallor.          Assessment & Plan:   Problem List Items Addressed This Visit      Unprioritized   Sleep apnea    Feeling so much better with cpap  Thrilled !    UTI (urinary tract infection)    ua clear after tx of e coli with cipro  Urine symptoms better Back almost totally better  Will continue better water intake and follow      Other Visit Diagnoses    Low back pain without sciatica, unspecified back pain laterality    -  Primary    Relevant Orders       POCT urinalysis dipstick (Completed)

## 2014-06-22 NOTE — Telephone Encounter (Signed)
Ebony Hail could schedule pt for B 12 nurse visit on 06/30/14 at 2 PM or 4 PM or 07/01/14 at 2 PM or 07/07/14 at 2 PM or 4 PM. If not convenient for pt let me know. Thank you.

## 2014-06-22 NOTE — Telephone Encounter (Signed)
Pt called in and is requesting Thursday 11/12 @ 2pm. Let me know if I can do anything to help with this.

## 2014-06-22 NOTE — Telephone Encounter (Signed)
Scheduled 07/01/14 @ 2pm.

## 2014-07-01 ENCOUNTER — Ambulatory Visit: Payer: Medicare Other

## 2014-07-02 ENCOUNTER — Ambulatory Visit (INDEPENDENT_AMBULATORY_CARE_PROVIDER_SITE_OTHER): Payer: Medicare Other

## 2014-07-02 DIAGNOSIS — E538 Deficiency of other specified B group vitamins: Secondary | ICD-10-CM

## 2014-07-02 MED ORDER — CYANOCOBALAMIN 1000 MCG/ML IJ SOLN
1000.0000 ug | Freq: Once | INTRAMUSCULAR | Status: AC
  Administered 2014-07-02: 1000 ug via INTRAMUSCULAR

## 2014-08-03 ENCOUNTER — Telehealth: Payer: Self-pay | Admitting: Family Medicine

## 2014-08-03 ENCOUNTER — Other Ambulatory Visit (INDEPENDENT_AMBULATORY_CARE_PROVIDER_SITE_OTHER): Payer: Medicare Other

## 2014-08-03 DIAGNOSIS — E785 Hyperlipidemia, unspecified: Secondary | ICD-10-CM | POA: Diagnosis not present

## 2014-08-03 DIAGNOSIS — R739 Hyperglycemia, unspecified: Secondary | ICD-10-CM

## 2014-08-03 DIAGNOSIS — R7989 Other specified abnormal findings of blood chemistry: Secondary | ICD-10-CM

## 2014-08-03 LAB — TSH: TSH: 3.58 u[IU]/mL (ref 0.35–4.50)

## 2014-08-03 LAB — T4, FREE: Free T4: 0.72 ng/dL (ref 0.60–1.60)

## 2014-08-03 LAB — HEMOGLOBIN A1C: Hgb A1c MFr Bld: 7 % — ABNORMAL HIGH (ref 4.6–6.5)

## 2014-08-03 NOTE — Telephone Encounter (Signed)
Letter mailed

## 2014-08-03 NOTE — Telephone Encounter (Signed)
Done and in IN box 

## 2014-08-03 NOTE — Telephone Encounter (Signed)
Renee [name] is requesting a letter for the post office that allows her to move her mailbox from across the street to closer to her house so that she doesn't have to cross the road. It becomes dangerous when she tries to cross the road due to her not being able to get out of the way of an incoming car. She is requesting that the letter be sent to her home address below. Any questions please call her.   [name] [id number]

## 2014-08-05 ENCOUNTER — Encounter: Payer: Self-pay | Admitting: *Deleted

## 2014-08-14 ENCOUNTER — Other Ambulatory Visit: Payer: Self-pay | Admitting: Family Medicine

## 2014-08-27 DIAGNOSIS — N183 Chronic kidney disease, stage 3 (moderate): Secondary | ICD-10-CM | POA: Diagnosis not present

## 2014-08-27 DIAGNOSIS — I10 Essential (primary) hypertension: Secondary | ICD-10-CM | POA: Diagnosis not present

## 2014-10-20 LAB — HM DIABETES EYE EXAM

## 2014-10-26 ENCOUNTER — Other Ambulatory Visit: Payer: Self-pay

## 2014-10-26 MED ORDER — METOPROLOL SUCCINATE ER 25 MG PO TB24: 12.5000 mg | ORAL_TABLET | Freq: Two times a day (BID) | ORAL | Status: DC

## 2014-10-26 MED ORDER — HYDRALAZINE HCL 50 MG PO TABS: 50.0000 mg | ORAL_TABLET | Freq: Three times a day (TID) | ORAL | Status: DC

## 2014-10-26 MED ORDER — AMLODIPINE BESYLATE 10 MG PO TABS: 10.0000 mg | ORAL_TABLET | Freq: Every day | ORAL | Status: DC

## 2014-10-26 MED ORDER — SERTRALINE HCL 50 MG PO TABS: 50.0000 mg | ORAL_TABLET | Freq: Every day | ORAL | Status: DC

## 2014-10-28 ENCOUNTER — Other Ambulatory Visit: Payer: Self-pay | Admitting: Family Medicine

## 2014-11-01 ENCOUNTER — Ambulatory Visit (INDEPENDENT_AMBULATORY_CARE_PROVIDER_SITE_OTHER): Payer: Medicare Other | Admitting: Ophthalmology

## 2014-11-01 DIAGNOSIS — E11319 Type 2 diabetes mellitus with unspecified diabetic retinopathy without macular edema: Secondary | ICD-10-CM | POA: Diagnosis not present

## 2014-11-01 DIAGNOSIS — H338 Other retinal detachments: Secondary | ICD-10-CM

## 2014-11-01 DIAGNOSIS — H35033 Hypertensive retinopathy, bilateral: Secondary | ICD-10-CM | POA: Diagnosis not present

## 2014-11-01 DIAGNOSIS — I10 Essential (primary) hypertension: Secondary | ICD-10-CM

## 2014-11-01 DIAGNOSIS — H43812 Vitreous degeneration, left eye: Secondary | ICD-10-CM | POA: Diagnosis not present

## 2014-11-01 DIAGNOSIS — H3531 Nonexudative age-related macular degeneration: Secondary | ICD-10-CM | POA: Diagnosis not present

## 2014-11-01 DIAGNOSIS — E11329 Type 2 diabetes mellitus with mild nonproliferative diabetic retinopathy without macular edema: Secondary | ICD-10-CM

## 2014-11-01 DIAGNOSIS — H35371 Puckering of macula, right eye: Secondary | ICD-10-CM | POA: Diagnosis not present

## 2014-11-02 ENCOUNTER — Telehealth: Payer: Self-pay

## 2014-11-02 NOTE — Telephone Encounter (Signed)
Chart says 50 tid I think - but do get in touch with her to clarify Thanks

## 2014-11-02 NOTE — Telephone Encounter (Signed)
Fax from [facility] to clarify is patient taking hydralazine 50mg  or 100mg .  Tried to call patient to clarify.  Please advise.

## 2014-11-02 NOTE — Telephone Encounter (Signed)
Per patient she is taking hydralazine 100mg  tid per Dr Billey Chang at [facility].

## 2014-11-05 ENCOUNTER — Encounter: Payer: Self-pay | Admitting: Family Medicine

## 2014-11-09 ENCOUNTER — Encounter: Payer: Self-pay | Admitting: Family Medicine

## 2014-11-09 ENCOUNTER — Telehealth: Payer: Self-pay | Admitting: Family Medicine

## 2014-11-09 ENCOUNTER — Ambulatory Visit (INDEPENDENT_AMBULATORY_CARE_PROVIDER_SITE_OTHER): Payer: Medicare Other | Admitting: Family Medicine

## 2014-11-09 VITALS — BP 154/72 | HR 58 | Temp 97.7°F | Wt 168.1 lb

## 2014-11-09 DIAGNOSIS — E538 Deficiency of other specified B group vitamins: Secondary | ICD-10-CM

## 2014-11-09 DIAGNOSIS — E785 Hyperlipidemia, unspecified: Secondary | ICD-10-CM | POA: Diagnosis not present

## 2014-11-09 DIAGNOSIS — R739 Hyperglycemia, unspecified: Secondary | ICD-10-CM

## 2014-11-09 DIAGNOSIS — R609 Edema, unspecified: Secondary | ICD-10-CM

## 2014-11-09 DIAGNOSIS — I10 Essential (primary) hypertension: Secondary | ICD-10-CM

## 2014-11-09 LAB — COMPREHENSIVE METABOLIC PANEL
ALT: 13 U/L (ref 0–35)
AST: 19 U/L (ref 0–37)
Albumin: 3.5 g/dL (ref 3.5–5.2)
Alkaline Phosphatase: 79 U/L (ref 39–117)
BUN: 19 mg/dL (ref 6–23)
CO2: 31 mEq/L (ref 19–32)
Calcium: 8.9 mg/dL (ref 8.4–10.5)
Chloride: 107 mEq/L (ref 96–112)
Creatinine, Ser: 1.3 mg/dL — ABNORMAL HIGH (ref 0.40–1.20)
GFR: 41.51 mL/min — ABNORMAL LOW (ref >60.00)
Glucose, Bld: 143 mg/dL — ABNORMAL HIGH (ref 70–99)
Potassium: 4.3 mEq/L (ref 3.5–5.1)
Sodium: 141 mEq/L (ref 135–145)
Total Bilirubin: 0.4 mg/dL (ref 0.2–1.2)
Total Protein: 5.5 g/dL — ABNORMAL LOW (ref 6.0–8.3)

## 2014-11-09 LAB — HEMOGLOBIN A1C: Hgb A1c MFr Bld: 7 % — ABNORMAL HIGH (ref 4.6–6.5)

## 2014-11-09 LAB — LIPID PANEL
Cholesterol: 201 mg/dL — ABNORMAL HIGH (ref 0–200)
HDL: 34 mg/dL — ABNORMAL LOW (ref >39.00)
NonHDL: 167
Total CHOL/HDL Ratio: 6
Triglycerides: 204 mg/dL — ABNORMAL HIGH (ref 0.0–149.0)
VLDL: 40.8 mg/dL — ABNORMAL HIGH (ref 0.0–40.0)

## 2014-11-09 LAB — LDL CHOLESTEROL, DIRECT: Direct LDL: 120 mg/dL

## 2014-11-09 MED ORDER — SERTRALINE HCL 50 MG PO TABS: 50.0000 mg | ORAL_TABLET | Freq: Every day | ORAL | Status: DC

## 2014-11-09 MED ORDER — CYANOCOBALAMIN 1000 MCG/ML IJ SOLN
1000.0000 ug | Freq: Once | INTRAMUSCULAR | Status: AC
  Administered 2014-11-09: 1000 ug via INTRAMUSCULAR

## 2014-11-09 MED ORDER — METOPROLOL SUCCINATE ER 25 MG PO TB24: 12.5000 mg | ORAL_TABLET | Freq: Two times a day (BID) | ORAL | Status: DC

## 2014-11-09 MED ORDER — AMLODIPINE BESYLATE 10 MG PO TABS: 10.0000 mg | ORAL_TABLET | Freq: Every day | ORAL | Status: DC

## 2014-11-09 NOTE — Telephone Encounter (Signed)
[facility]  Patient Name: Renee [name]  DOB: 1928-12-26    Initial Comment Caller states patient BP medication was increased--she has been having intense swelling in feet and face.    Nurse Assessment  Nurse: Wynetta Emery, RN, Baker Janus Date/Time Eilene Ghazi Time): 11/09/2014 10:54:53 AM  Confirm and document reason for call. If symptomatic, describe symptoms. ---[name] takes b/p medication changed 1 month ago and swelling of legs and face started last week pitting edema present. pulse ox is 93% usually 97% Is wheezing more than usual 162/78 this am has not had medications this am  Has the patient traveled out of the country within the last 30 days? ---No  Does the patient require triage? ---Yes  Related visit to physician within the last 2 weeks? ---No  Does the PT have any chronic conditions? (i.e. diabetes, asthma, etc.) ---Yes  List chronic conditions. ---HTN COPD Diabetes     Guidelines    Guideline Title Affirmed Question Affirmed Notes  Leg Swelling and Edema SEVERE leg swelling (e.g., swelling extends above knee, entire leg is swollen, weeping fluid)    Final Disposition User   See Physician within 4 Hours (or PCP triage) Wynetta Emery, RN, Baker Janus    Comments  Appt Scheduled 12noon with Dr. [name]

## 2014-11-09 NOTE — Telephone Encounter (Signed)
Pt was seen today.

## 2014-11-09 NOTE — Progress Notes (Signed)
Subjective:    Patient ID: Renee [name], female    DOB: September 28, 1928, 81 y.o.   MRN: [id number]  HPI Here with edema    Saw Dr Holley Raring recently - inc her hydralazine to 100   Is dependent  If she puts her feet up - gets better very briefly  Is tight and uncomfortable   Has some support stockings- have but do not wear them   Lab Results  Component Value Date   CREATININE 1.4* 04/20/2014   BUN 24* 04/20/2014   NA 139 04/20/2014   K 4.2 04/20/2014   CL 106 04/20/2014   CO2 29 04/20/2014     No sob  No cp    Patient Active Problem List   Diagnosis Date Noted  . UTI (urinary tract infection) 05/31/2014  . Abnormal TSH 04/27/2014  . Encounter for Medicare annual wellness exam 04/17/2013  . Mobility impaired 06/26/2011  . History of retinal detachment 01/10/2011  . Sleep apnea 11/28/2010  . Depression with anxiety 08/25/2010  . CVA WITH LEFT HEMIPARESIS 07/05/2010  . POSTHERPETIC NEURALGIA 11/09/2009  . OSTEOPENIA 12/20/2008  . MUSCLE WEAKNESS (GENERALIZED) 08/03/2008  . RENAL INSUFFICIENCY 06/29/2008  . BACK PAIN 01/26/2008  . EDEMA 01/26/2008  . B12 deficiency 01/10/2007  . Hyperglycemia 11/27/2006  . Hyperlipidemia 11/27/2006  . Essential hypertension 11/27/2006  . FIBROCYSTIC BREAST DISEASE 11/27/2006  . ROSACEA 11/27/2006  . OSTEOARTHRITIS 11/27/2006  . URINARY INCONTINENCE, MIXED 11/27/2006  . ANGIOEDEMA 11/27/2006   Past Medical History  Diagnosis Date  . Sleep apnea   . Diabetes mellitus     type II  . Hypertension   . Hyperlipidemia   . Stroke 05/2010    Small vessel sobcortical (in [place] trial) with Dr Leonie Man, residual L hemiparesis  . Osteoarthritis   . Angioedema     possibly from voltaren  . Vitamin B 12 deficiency 04/08  . LVH (left ventricular hypertrophy)     and atrial enlargement by echo in past with nl EF  . Degenerative disc disease   . Osteopenia   . Nasal pruritis   . Renal insufficiency    Past Surgical History  Procedure  Laterality Date  . Eye surgery      cataract extraction  . Abdominal hysterectomy      BSO-fibroids  . Knee surgery      arthroscope  . Spine surgery  08/09    spinal decompression surgery  . Retinal detachment surgery  02/18/11    times 2  . Back surgery    . Colon surgery      due to punctured intestines  . Appendectomy    . Pars plana vitrectomy  07/31/2011    Procedure: PARS PLANA VITRECTOMY WITH 25 GAUGE;  Surgeon: [name], MD;  Location: [facility];  Service: Ophthalmology;  Laterality: Right;  REMOVAL OF SILICONE OIL AND LASER RIGHT EYE   History  Substance Use Topics  . Smoking status: Never Smoker   . Smokeless tobacco: Never Used  . Alcohol Use: No   Family History  Problem Relation Age of Onset  . Cancer Sister     brain tumor with hemmorhage  . Heart disease Sister     CAD  . COPD Brother    Allergies  Allergen Reactions  . Diclofenac Sodium     REACTION: angioedema  . Pioglitazone     REACTION: swelling   Current Outpatient Prescriptions on File Prior to Visit  Medication Sig Dispense Refill  .  acetaminophen (TYLENOL) 500 MG tablet Take 500 mg by mouth 2 (two) times daily.    Marland Kitchen aspirin 325 MG tablet Take 325 mg by mouth daily.     . calcium-vitamin D (OSCAL WITH D) 500-200 MG-UNIT per tablet Take 1 tablet by mouth daily.     . Cholecalciferol (VITAMIN D) 1000 UNITS capsule Take 1,000 Units by mouth daily.     . cyanocobalamin (,VITAMIN B-12,) 1000 MCG/ML injection Inject 1,000 mcg into the muscle every 30 (thirty) days.     . ferrous sulfate 325 (65 FE) MG tablet Take 325 mg by mouth 2 (two) times daily.    Marland Kitchen gemfibrozil (LOPID) 600 MG tablet TAKE 1 TABLET EVERY DAY 90 tablet 3  . mirtazapine (REMERON) 15 MG tablet Take 0.5 tablets (7.5 mg total) by mouth at bedtime. 45 tablet 3  . Multiple Vitamin (MULTIVITAMIN) capsule Take 1 capsule by mouth daily.     . trandolapril (MAVIK) 4 MG tablet TAKE 1 TABLET EVERY DAY 90 tablet 1   No current  facility-administered medications on file prior to visit.      Review of Systems Review of Systems  Constitutional: Negative for fever, appetite change, fatigue and unexpected weight change.  Eyes: Negative for pain and visual disturbance.  Respiratory: Negative for cough and shortness of breath.   Cardiovascular: Negative for cp or palpitations   pos for pedal edema , neg for pnd or orthopnea  Gastrointestinal: Negative for nausea, diarrhea and constipation.  Genitourinary: Negative for urgency and frequency.  Skin: Negative for pallor or rash   Neurological: Negative for weakness, light-headedness, numbness and headaches.  Hematological: Negative for adenopathy. Does not bruise/bleed easily.  Psychiatric/Behavioral: Negative for dysphoric mood. The patient is not nervous/anxious.         Objective:   Physical Exam  Constitutional: She appears well-developed and well-nourished. No distress.  HENT:  Head: Normocephalic and atraumatic.  Mouth/Throat: Oropharynx is clear and moist.  Eyes: Conjunctivae and EOM are normal. Pupils are equal, round, and reactive to light.  Neck: Normal range of motion. Neck supple. Carotid bruit is not present.  Cardiovascular: Normal rate and regular rhythm.   Pulmonary/Chest: Effort normal and breath sounds normal. No respiratory distress. She has no wheezes. She has no rales.  Musculoskeletal: She exhibits edema.  Trace to one plus pitting edema to shin No palpable cords or tenderness in legs   Lymphadenopathy:    She has no cervical adenopathy.  Neurological: She is alert. She has normal reflexes. No cranial nerve deficit.  Skin: Skin is warm and dry. No rash noted. No erythema. No pallor.  Psychiatric: She has a normal mood and affect.          Assessment & Plan:   Problem List Items Addressed This Visit      Cardiovascular and Mediastinum   Essential hypertension - Primary    BP: (!) 154/72 mmHg    Recently added hydralazine from  renal - and having pedal edema  May add lasix if no imp in that       Relevant Medications   hydrALAZINE (APRESOLINE) 100 MG tablet   amLODIpine (NORVASC) tablet   metoprolol succinate (TOPROL-XL) 24 hr tablet   Other Relevant Orders   Comprehensive metabolic panel (Completed)     Digestive   B12 deficiency   Relevant Medications   cyanocobalamin ((VITAMIN B-12)) injection 1,000 mcg (Completed)     Other   EDEMA    Worse pedal edema - may be from  recently increased dose of hydralazine  Disc imp of sodium avoidance/leg elevation while sitting She will go back to wearing supp hose to knee during the day Consider lasix if still uncomfortable  Lab today Watching bp  F/u with renal as planned       Relevant Orders   Comprehensive metabolic panel (Completed)   Hyperglycemia   Relevant Orders   Hemoglobin A1c (Completed)   Hyperlipidemia   Relevant Medications   hydrALAZINE (APRESOLINE) 100 MG tablet   amLODIpine (NORVASC) tablet   metoprolol succinate (TOPROL-XL) 24 hr tablet   Other Relevant Orders   Lipid panel (Completed)

## 2014-11-09 NOTE — Progress Notes (Signed)
Pre visit review using our clinic review tool, if applicable. No additional management support is needed unless otherwise documented below in the visit note. 

## 2014-11-09 NOTE — Patient Instructions (Signed)
Try your support stockings to the knee for your swelling Also avoid sodium  Elevate your legs when sitting  B12 shot today  Lab today (you can cancel your lab appt)   If swelling is not improved in April we may consider some lasix

## 2014-11-10 ENCOUNTER — Telehealth: Payer: Self-pay

## 2014-11-10 NOTE — Telephone Encounter (Signed)
-----   Message from Abner Greenspan, MD sent at 11/09/2014  5:45 PM EDT ----- Labs look fairly stable  Will disc further at her annual exam

## 2014-11-10 NOTE — Assessment & Plan Note (Signed)
Worse pedal edema - may be from recently increased dose of hydralazine  Disc imp of sodium avoidance/leg elevation while sitting She will go back to wearing supp hose to knee during the day Consider lasix if still uncomfortable  Lab today Watching bp  F/u with renal as planned

## 2014-11-10 NOTE — Telephone Encounter (Signed)
Patient aware of normal lab results and recommendations.

## 2014-11-10 NOTE — Assessment & Plan Note (Signed)
BP: (!) 154/72 mmHg    Recently added hydralazine from renal - and having pedal edema  May add lasix if no imp in that

## 2014-11-16 ENCOUNTER — Other Ambulatory Visit: Payer: Medicare Other

## 2014-11-23 ENCOUNTER — Ambulatory Visit (INDEPENDENT_AMBULATORY_CARE_PROVIDER_SITE_OTHER): Payer: Medicare Other | Admitting: Family Medicine

## 2014-11-23 ENCOUNTER — Encounter: Payer: Self-pay | Admitting: Family Medicine

## 2014-11-23 VITALS — BP 130/80 | HR 68 | Temp 97.6°F | Ht 62.25 in | Wt 189.4 lb

## 2014-11-23 DIAGNOSIS — N289 Disorder of kidney and ureter, unspecified: Secondary | ICD-10-CM

## 2014-11-23 DIAGNOSIS — E119 Type 2 diabetes mellitus without complications: Secondary | ICD-10-CM

## 2014-11-23 DIAGNOSIS — E785 Hyperlipidemia, unspecified: Secondary | ICD-10-CM

## 2014-11-23 DIAGNOSIS — I10 Essential (primary) hypertension: Secondary | ICD-10-CM

## 2014-11-23 MED ORDER — GLIPIZIDE ER 5 MG PO TB24: 5.0000 mg | ORAL_TABLET | Freq: Every day | ORAL | Status: DC

## 2014-11-23 NOTE — Patient Instructions (Signed)
Start glipizide ER 5 mg daily in the am for diabetes  Please eat regular meals and watch for low blood sugar - check glucose at different times  If any problems with the medication please let me know  Make sure to drink enough water  Blood pressure is better today Follow up in 3 months with labs prior

## 2014-11-23 NOTE — Assessment & Plan Note (Signed)
Lab Results  Component Value Date   HGBA1C 7.0* 11/09/2014   This is stable - with improved diet Rev low glycemic plan  Start glipizide xl 5 mg -warned of poss of hypoglycemia (disc symptoms) and need to eat regular meals  Update if problems or side eff F/u 3 mo with lab prior

## 2014-11-23 NOTE — Assessment & Plan Note (Signed)
Disc goals for lipids and reasons to control them Rev labs with pt Rev low sat fat diet in detail Not at goal for DM with hx of CVA On lopid - hesitant to add statin to this in light of potential problems  May disc switch at next visit  Rev diet

## 2014-11-23 NOTE — Progress Notes (Signed)
Subjective:    Patient ID: Renee [name], female    DOB: [birth year], 81 y.o.   MRN: [id number]  HPI Here for f/u of chronic health problems   HTN with renal insuff bp is stable today - renal doctor is working with her  No cp or palpitations or headaches or edema  No side effects to medicines  BP Readings from Last 3 Encounters:  11/23/14 140/72  11/09/14 154/72  06/18/14 122/76     Edema - hose are helping quite a bit   Wt is up by our scales - pt does not think it was correct the last time     Chemistry      Component Value Date/Time   NA 141 11/09/2014 1328   K 4.3 11/09/2014 1328   CL 107 11/09/2014 1328   CO2 31 11/09/2014 1328   BUN 19 11/09/2014 1328   CREATININE 1.30* 11/09/2014 1328      Component Value Date/Time   CALCIUM 8.9 11/09/2014 1328   ALKPHOS 79 11/09/2014 1328   AST 19 11/09/2014 1328   ALT 13 11/09/2014 1328   BILITOT 0.4 11/09/2014 1328     she is drinking more water   Hyperlipidemia On lopid for triglycerides Lab Results  Component Value Date   CHOL 201* 11/09/2014   CHOL 185 04/20/2014   CHOL 200 10/21/2013   Lab Results  Component Value Date   HDL 34.00* 11/09/2014   HDL 34.50* 04/20/2014   HDL 38.50* 10/21/2013   Lab Results  Component Value Date   LDLCALC 117* 04/20/2014   LDLCALC 126* 10/21/2013   LDLCALC 114* 03/31/2013   Lab Results  Component Value Date   TRIG 204.0* 11/09/2014   TRIG 169.0* 04/20/2014   TRIG 176.0* 10/21/2013   Lab Results  Component Value Date   CHOLHDL 6 11/09/2014   CHOLHDL 5 04/20/2014   CHOLHDL 5 10/21/2013   Lab Results  Component Value Date   LDLDIRECT 120.0 11/09/2014   eating well - staying away from grease and red meat   Diabetes Home sugar results -130s usually (am)  DM diet - she is watching her diet /no sweets  Does eat too much bread (that is her weakness)  Exercise - is up and around with walker- even working in her yard (strength is better)  Symptoms- inc volume or urine   A1C last  Lab Results  Component Value Date   HGBA1C 7.0* 11/09/2014  this is unchanged tx with diet alone   No problems with medications -ace  Renal protection ace  Last eye exam 3/16  Patient Active Problem List   Diagnosis Date Noted  . UTI (urinary tract infection) 05/31/2014  . Abnormal TSH 04/27/2014  . Encounter for Medicare annual wellness exam 04/17/2013  . Mobility impaired 06/26/2011  . History of retinal detachment 01/10/2011  . Sleep apnea 11/28/2010  . Depression with anxiety 08/25/2010  . CVA WITH LEFT HEMIPARESIS 07/05/2010  . POSTHERPETIC NEURALGIA 11/09/2009  . OSTEOPENIA 12/20/2008  . MUSCLE WEAKNESS (GENERALIZED) 08/03/2008  . Renal insufficiency 06/29/2008  . BACK PAIN 01/26/2008  . EDEMA 01/26/2008  . B12 deficiency 01/10/2007  . Diabetes type 2, controlled 11/27/2006  . Hyperlipidemia 11/27/2006  . Essential hypertension 11/27/2006  . FIBROCYSTIC BREAST DISEASE 11/27/2006  . ROSACEA 11/27/2006  . OSTEOARTHRITIS 11/27/2006  . URINARY INCONTINENCE, MIXED 11/27/2006  . ANGIOEDEMA 11/27/2006   Past Medical History  Diagnosis Date  . Sleep apnea   . Diabetes mellitus  type II  . Hypertension   . Hyperlipidemia   . Stroke 05/2010    Small vessel sobcortical (in [place] trial) with Dr Leonie Man, residual L hemiparesis  . Osteoarthritis   . Angioedema     possibly from voltaren  . Vitamin B 12 deficiency 04/08  . LVH (left ventricular hypertrophy)     and atrial enlargement by echo in past with nl EF  . Degenerative disc disease   . Osteopenia   . Nasal pruritis   . Renal insufficiency    Past Surgical History  Procedure Laterality Date  . Eye surgery      cataract extraction  . Abdominal hysterectomy      BSO-fibroids  . Knee surgery      arthroscope  . Spine surgery  08/09    spinal decompression surgery  . Retinal detachment surgery  02/18/11    times 2  . Back surgery    . Colon surgery      due to punctured intestines  .  Appendectomy    . Pars plana vitrectomy  07/31/2011    Procedure: PARS PLANA VITRECTOMY WITH 25 GAUGE;  Surgeon: [name], MD;  Location: [facility];  Service: Ophthalmology;  Laterality: Right;  REMOVAL OF SILICONE OIL AND LASER RIGHT EYE   History  Substance Use Topics  . Smoking status: Never Smoker   . Smokeless tobacco: Never Used  . Alcohol Use: No   Family History  Problem Relation Age of Onset  . Cancer Sister     brain tumor with hemmorhage  . Heart disease Sister     CAD  . COPD Brother    Allergies  Allergen Reactions  . Diclofenac Sodium     REACTION: angioedema  . Pioglitazone     REACTION: swelling   Current Outpatient Prescriptions on File Prior to Visit  Medication Sig Dispense Refill  . acetaminophen (TYLENOL) 500 MG tablet Take 500 mg by mouth 2 (two) times daily.    Marland Kitchen amLODipine (NORVASC) 10 MG tablet Take 1 tablet (10 mg total) by mouth daily. 90 tablet 3  . aspirin 325 MG tablet Take 325 mg by mouth daily.     . calcium-vitamin D (OSCAL WITH D) 500-200 MG-UNIT per tablet Take 1 tablet by mouth daily.     . Cholecalciferol (VITAMIN D) 1000 UNITS capsule Take 1,000 Units by mouth daily.     . cyanocobalamin (,VITAMIN B-12,) 1000 MCG/ML injection Inject 1,000 mcg into the muscle every 30 (thirty) days.     . ferrous sulfate 325 (65 FE) MG tablet Take 325 mg by mouth 2 (two) times daily.    Marland Kitchen gemfibrozil (LOPID) 600 MG tablet TAKE 1 TABLET EVERY DAY 90 tablet 3  . hydrALAZINE (APRESOLINE) 100 MG tablet     . metoprolol succinate (TOPROL-XL) 25 MG 24 hr tablet Take 0.5 tablets (12.5 mg total) by mouth 2 (two) times daily. 270 tablet 3  . mirtazapine (REMERON) 15 MG tablet Take 0.5 tablets (7.5 mg total) by mouth at bedtime. 45 tablet 3  . Multiple Vitamin (MULTIVITAMIN) capsule Take 1 capsule by mouth daily.     . sertraline (ZOLOFT) 50 MG tablet Take 1 tablet (50 mg total) by mouth daily. 90 tablet 3  . trandolapril (MAVIK) 4 MG tablet TAKE 1 TABLET EVERY  DAY 90 tablet 1   No current facility-administered medications on file prior to visit.     Review of Systems Review of Systems  Constitutional: Negative for fever, appetite  change, fatigue and unexpected weight change.  Eyes: Negative for pain and visual disturbance.  Respiratory: Negative for cough and shortness of breath.   Cardiovascular: Negative for cp or palpitations    Gastrointestinal: Negative for nausea, diarrhea and constipation.  Genitourinary: Negative for urgency and frequency.  Skin: Negative for pallor or rash   Neurological: Negative for weakness, light-headedness, numbness and headaches.  Hematological: Negative for adenopathy. Does not bruise/bleed easily.  Psychiatric/Behavioral: Negative for dysphoric mood. The patient is not nervous/anxious.         Objective:   Physical Exam  Constitutional: She appears well-developed and well-nourished. No distress.  HENT:  Head: Normocephalic and atraumatic.  Mouth/Throat: Oropharynx is clear and moist.  Eyes: Conjunctivae and EOM are normal. Pupils are equal, round, and reactive to light. No scleral icterus.  Neck: Normal range of motion. Neck supple. No JVD present. Carotid bruit is not present. No thyromegaly present.  Cardiovascular: Normal rate, regular rhythm, normal heart sounds and intact distal pulses.  Exam reveals no gallop.   Pulmonary/Chest: Effort normal and breath sounds normal. No respiratory distress. She has no wheezes. She has no rales.  Abdominal: Soft. Bowel sounds are normal. She exhibits no distension and no mass. There is no tenderness.  Musculoskeletal: She exhibits no edema.  Lymphadenopathy:    She has no cervical adenopathy.  Neurological: She is alert. She has normal reflexes. No cranial nerve deficit. She exhibits normal muscle tone. Coordination normal.  Baseline L hemiparesis   Skin: Skin is warm and dry. No rash noted. No erythema. No pallor.  Psychiatric: She has a normal mood and affect.           Assessment & Plan:   Problem List Items Addressed This Visit      Cardiovascular and Mediastinum   Essential hypertension     Endocrine   Diabetes type 2, controlled    Lab Results  Component Value Date   HGBA1C 7.0* 11/09/2014   This is stable - with improved diet Rev low glycemic plan  Start glipizide xl 5 mg -warned of poss of hypoglycemia (disc symptoms) and need to eat regular meals  Update if problems or side eff F/u 3 mo with lab prior         Relevant Medications   glipiZIDE (GLUCOTROL XL) 24 hr tablet     Genitourinary   Renal insufficiency - Primary    Stable Sees renal  Avoiding metformin for DM (will try glipizide)        Other   Hyperlipidemia    Disc goals for lipids and reasons to control them Rev labs with pt Rev low sat fat diet in detail Not at goal for DM with hx of CVA On lopid - hesitant to add statin to this in light of potential problems  May disc switch at next visit  Rev diet

## 2014-11-23 NOTE — Progress Notes (Signed)
Pre visit review using our clinic review tool, if applicable. No additional management support is needed unless otherwise documented below in the visit note. 

## 2014-11-23 NOTE — Assessment & Plan Note (Signed)
Stable Sees renal  Avoiding metformin for DM (will try glipizide)

## 2014-11-29 ENCOUNTER — Telehealth: Payer: Self-pay | Admitting: Family Medicine

## 2014-11-29 NOTE — Telephone Encounter (Signed)
Anderson Malta Ms [facility] care giver called per bettys request Ms Malloy was put on glipizide er 5mg    1 tablet a day with breakfast  Anderson Malta checked her sugar every morning before breakfast Since she has been on the meds  11/24/14 4/7  113 4/8  113 4/9    99 4/10 100 4/11   90   should she still take in am with breakfast. Or maybe every other day??   Ms Philipp stated she is feeling weak  Please advise on what to do about glipizide dosage??

## 2014-11-29 NOTE — Telephone Encounter (Signed)
Those blood sugars are technically not low - (under 70 is more concerning)- however if she FEELS like they are too low then we can change the med to glipizide ER 2.5 mg 1 po qd # 30 5 ref (call in if she wants to switch)  Then leg me know if improved  Thanks

## 2014-11-29 NOTE — Telephone Encounter (Signed)
Patient does not want to change medication at this time.  She will continue to monitor glucose levels.  If they start running closer or lower than 70 she will call back.

## 2014-12-28 ENCOUNTER — Other Ambulatory Visit: Payer: Self-pay | Admitting: Family Medicine

## 2014-12-28 DIAGNOSIS — I10 Essential (primary) hypertension: Secondary | ICD-10-CM | POA: Diagnosis not present

## 2014-12-28 DIAGNOSIS — N183 Chronic kidney disease, stage 3 (moderate): Secondary | ICD-10-CM | POA: Diagnosis not present

## 2015-02-18 ENCOUNTER — Other Ambulatory Visit (INDEPENDENT_AMBULATORY_CARE_PROVIDER_SITE_OTHER): Payer: Medicare Other

## 2015-02-18 DIAGNOSIS — I10 Essential (primary) hypertension: Secondary | ICD-10-CM

## 2015-02-18 DIAGNOSIS — N289 Disorder of kidney and ureter, unspecified: Secondary | ICD-10-CM | POA: Diagnosis not present

## 2015-02-18 DIAGNOSIS — E785 Hyperlipidemia, unspecified: Secondary | ICD-10-CM | POA: Diagnosis not present

## 2015-02-18 DIAGNOSIS — E119 Type 2 diabetes mellitus without complications: Secondary | ICD-10-CM

## 2015-02-18 LAB — BASIC METABOLIC PANEL
BUN: 21 mg/dL (ref 6–23)
CO2: 27 mEq/L (ref 19–32)
Calcium: 9.1 mg/dL (ref 8.4–10.5)
Chloride: 108 mEq/L (ref 96–112)
Creatinine, Ser: 1.49 mg/dL — ABNORMAL HIGH (ref 0.40–1.20)
GFR: 35.44 mL/min — ABNORMAL LOW (ref >60.00)
Glucose, Bld: 94 mg/dL (ref 70–99)
Potassium: 4.5 mEq/L (ref 3.5–5.1)
Sodium: 143 mEq/L (ref 135–145)

## 2015-02-18 LAB — LIPID PANEL
Cholesterol: 195 mg/dL (ref 0–200)
HDL: 35.4 mg/dL — ABNORMAL LOW (ref >39.00)
LDL Cholesterol: 124 mg/dL — ABNORMAL HIGH (ref 0–99)
NonHDL: 159.6
Total CHOL/HDL Ratio: 6
Triglycerides: 176 mg/dL — ABNORMAL HIGH (ref 0.0–149.0)
VLDL: 35.2 mg/dL (ref 0.0–40.0)

## 2015-02-18 LAB — HEMOGLOBIN A1C: Hgb A1c MFr Bld: 5.5 % (ref 4.6–6.5)

## 2015-02-18 LAB — HM DIABETES EYE EXAM

## 2015-02-23 ENCOUNTER — Encounter: Payer: Self-pay | Admitting: Family Medicine

## 2015-02-23 ENCOUNTER — Ambulatory Visit (INDEPENDENT_AMBULATORY_CARE_PROVIDER_SITE_OTHER): Payer: Medicare Other | Admitting: Family Medicine

## 2015-02-23 VITALS — BP 110/62 | HR 58 | Temp 97.8°F | Ht 62.25 in | Wt 181.8 lb

## 2015-02-23 DIAGNOSIS — E785 Hyperlipidemia, unspecified: Secondary | ICD-10-CM

## 2015-02-23 DIAGNOSIS — E538 Deficiency of other specified B group vitamins: Secondary | ICD-10-CM | POA: Diagnosis not present

## 2015-02-23 DIAGNOSIS — N289 Disorder of kidney and ureter, unspecified: Secondary | ICD-10-CM

## 2015-02-23 DIAGNOSIS — I10 Essential (primary) hypertension: Secondary | ICD-10-CM | POA: Diagnosis not present

## 2015-02-23 DIAGNOSIS — E119 Type 2 diabetes mellitus without complications: Secondary | ICD-10-CM

## 2015-02-23 MED ORDER — GLIPIZIDE ER 2.5 MG PO TB24: 2.5000 mg | ORAL_TABLET | Freq: Every day | ORAL | Status: DC

## 2015-02-23 MED ORDER — CYANOCOBALAMIN 1000 MCG/ML IJ SOLN
1000.0000 ug | Freq: Once | INTRAMUSCULAR | Status: AC
  Administered 2015-02-23: 1000 ug via INTRAMUSCULAR

## 2015-02-23 NOTE — Progress Notes (Signed)
Subjective:    Patient ID: Renee [name], female    DOB: Jun 12, 1929, 81 y.o.   MRN: [id number]  HPI Here for f/u of chronic medical problems  Wt is down 8 lb with bmi of 32 Improved Working on it - watching diet / and also working more  If feeling good    Diabetes Home sugar results running 70-80 in general , feels ok  DM diet - overall better  Exercise - more/ more work lately  Symptoms-none  A1C last  Lab Results  Component Value Date   HGBA1C 5.5 02/18/2015  this is down from 7.0 last time  Glipizide er 5 mg was added - pt had concerns of low bp Renal protection ace  Last eye exam 3/16  bp is stable today  No cp or palpitations or headaches or edema  No side effects to medicines  BP Readings from Last 3 Encounters:  02/23/15 110/62  11/23/14 130/80  11/09/14 154/72      Hyperlipidemia On lopid for trig  Lab Results  Component Value Date   CHOL 195 02/18/2015   CHOL 201* 11/09/2014   CHOL 185 04/20/2014   Lab Results  Component Value Date   HDL 35.40* 02/18/2015   HDL 34.00* 11/09/2014   HDL 34.50* 04/20/2014   Lab Results  Component Value Date   LDLCALC 124* 02/18/2015   LDLCALC 117* 04/20/2014   LDLCALC 126* 10/21/2013   Lab Results  Component Value Date   TRIG 176.0* 02/18/2015   TRIG 204.0* 11/09/2014   TRIG 169.0* 04/20/2014   Lab Results  Component Value Date   CHOLHDL 6 02/18/2015   CHOLHDL 6 11/09/2014   CHOLHDL 5 04/20/2014   Lab Results  Component Value Date   LDLDIRECT 120.0 11/09/2014     ? Considering change from lopid to statin in the future  Eating is good   Having hot flashes   Sees renal  Lab Results  Component Value Date   CREATININE 1.49* 02/18/2015   BUN 21 02/18/2015   NA 143 02/18/2015   K 4.5 02/18/2015   CL 108 02/18/2015   CO2 27 02/18/2015    Up a bit  Needs to drink more water   Patient Active Problem List   Diagnosis Date Noted  . UTI (urinary tract infection) 05/31/2014  . Abnormal TSH  04/27/2014  . Encounter for Medicare annual wellness exam 04/17/2013  . Mobility impaired 06/26/2011  . History of retinal detachment 01/10/2011  . Sleep apnea 11/28/2010  . Depression with anxiety 08/25/2010  . CVA WITH LEFT HEMIPARESIS 07/05/2010  . POSTHERPETIC NEURALGIA 11/09/2009  . OSTEOPENIA 12/20/2008  . MUSCLE WEAKNESS (GENERALIZED) 08/03/2008  . Renal insufficiency 06/29/2008  . BACK PAIN 01/26/2008  . EDEMA 01/26/2008  . B12 deficiency 01/10/2007  . Diabetes type 2, controlled 11/27/2006  . Hyperlipidemia 11/27/2006  . Essential hypertension 11/27/2006  . FIBROCYSTIC BREAST DISEASE 11/27/2006  . ROSACEA 11/27/2006  . OSTEOARTHRITIS 11/27/2006  . URINARY INCONTINENCE, MIXED 11/27/2006  . ANGIOEDEMA 11/27/2006   Past Medical History  Diagnosis Date  . Sleep apnea   . Diabetes mellitus     type II  . Hypertension   . Hyperlipidemia   . Stroke 05/2010    Small vessel sobcortical (in [place] trial) with Dr Leonie Man, residual L hemiparesis  . Osteoarthritis   . Angioedema     possibly from voltaren  . Vitamin B 12 deficiency 04/08  . LVH (left ventricular hypertrophy)     and atrial  enlargement by echo in past with nl EF  . Degenerative disc disease   . Osteopenia   . Nasal pruritis   . Renal insufficiency    Past Surgical History  Procedure Laterality Date  . Eye surgery      cataract extraction  . Abdominal hysterectomy      BSO-fibroids  . Knee surgery      arthroscope  . Spine surgery  08/09    spinal decompression surgery  . Retinal detachment surgery  02/18/11    times 2  . Back surgery    . Colon surgery      due to punctured intestines  . Appendectomy    . Pars plana vitrectomy  07/31/2011    Procedure: PARS PLANA VITRECTOMY WITH 25 GAUGE;  Surgeon: [name], MD;  Location: [facility];  Service: Ophthalmology;  Laterality: Right;  REMOVAL OF SILICONE OIL AND LASER RIGHT EYE   History  Substance Use Topics  . Smoking status: Never Smoker   .  Smokeless tobacco: Never Used  . Alcohol Use: No   Family History  Problem Relation Age of Onset  . Cancer Sister     brain tumor with hemmorhage  . Heart disease Sister     CAD  . COPD Brother    Allergies  Allergen Reactions  . Diclofenac Sodium     REACTION: angioedema  . Pioglitazone     REACTION: swelling   Current Outpatient Prescriptions on File Prior to Visit  Medication Sig Dispense Refill  . acetaminophen (TYLENOL) 500 MG tablet Take 500 mg by mouth 2 (two) times daily.    Marland Kitchen amLODipine (NORVASC) 10 MG tablet Take 1 tablet (10 mg total) by mouth daily. 90 tablet 3  . aspirin 325 MG tablet Take 325 mg by mouth daily.     . calcium-vitamin D (OSCAL WITH D) 500-200 MG-UNIT per tablet Take 1 tablet by mouth daily.     . Cholecalciferol (VITAMIN D) 1000 UNITS capsule Take 1,000 Units by mouth daily.     . cyanocobalamin (,VITAMIN B-12,) 1000 MCG/ML injection Inject 1,000 mcg into the muscle every 30 (thirty) days.     . ferrous sulfate 325 (65 FE) MG tablet Take 325 mg by mouth 2 (two) times daily.    Marland Kitchen gemfibrozil (LOPID) 600 MG tablet TAKE 1 TABLET EVERY DAY 90 tablet 3  . hydrALAZINE (APRESOLINE) 100 MG tablet     . metoprolol succinate (TOPROL-XL) 25 MG 24 hr tablet Take 0.5 tablets (12.5 mg total) by mouth 2 (two) times daily. 270 tablet 3  . mirtazapine (REMERON) 15 MG tablet Take 0.5 tablets (7.5 mg total) by mouth at bedtime. 45 tablet 3  . Multiple Vitamin (MULTIVITAMIN) capsule Take 1 capsule by mouth daily.     . sertraline (ZOLOFT) 50 MG tablet Take 1 tablet (50 mg total) by mouth daily. 90 tablet 3  . trandolapril (MAVIK) 4 MG tablet TAKE 1 TABLET EVERY DAY 90 tablet 0   No current facility-administered medications on file prior to visit.    Review of Systems Review of Systems  Constitutional: Negative for fever, appetite change, fatigue and unexpected weight change.  Eyes: Negative for pain and visual disturbance.  Respiratory: Negative for cough and  shortness of breath.   Cardiovascular: Negative for cp or palpitations    Gastrointestinal: Negative for nausea, diarrhea and constipation.  Genitourinary: Negative for urgency and frequency.  Skin: Negative for pallor or rash   Neurological: Negative for weakness, light-headedness, numbness  and headaches.  Hematological: Negative for adenopathy. Does not bruise/bleed easily.  Psychiatric/Behavioral: Negative for dysphoric mood. The patient is not nervous/anxious.         Objective:   Physical Exam  Constitutional: She appears well-developed and well-nourished. No distress.  obese and well appearing   HENT:  Head: Normocephalic and atraumatic.  Mouth/Throat: Oropharynx is clear and moist.  Eyes: Conjunctivae and EOM are normal. Pupils are equal, round, and reactive to light.  Neck: Normal range of motion. Neck supple. No JVD present. Carotid bruit is not present. No thyromegaly present.  Cardiovascular: Normal rate, regular rhythm, normal heart sounds and intact distal pulses.  Exam reveals no gallop.   Pulmonary/Chest: Effort normal and breath sounds normal. No respiratory distress. She has no wheezes. She has no rales.  No crackles  Abdominal: Soft. Bowel sounds are normal. She exhibits no distension, no abdominal bruit and no mass. There is no tenderness.  Musculoskeletal: She exhibits no edema.  Lymphadenopathy:    She has no cervical adenopathy.  Neurological: She is alert. She has normal reflexes.  Baseline hemiparesis   Skin: Skin is warm and dry. No rash noted.  Psychiatric: She has a normal mood and affect.          Assessment & Plan:   Problem List Items Addressed This Visit    B12 deficiency    B12 shot today      Relevant Medications   cyanocobalamin ((VITAMIN B-12)) injection 1,000 mcg (Completed)   Diabetes type 2, controlled    Lab Results  Component Value Date   HGBA1C 5.5 02/18/2015   Some hypoglycemia Will dec her glipizide ER to 2.5 mg and watch  carefully  Diet is good More active/ wt loss noted       Relevant Medications   glipiZIDE (GLUCOTROL XL) 2.5 MG 24 hr tablet   Essential hypertension - Primary    bp in fair control at this time  BP Readings from Last 1 Encounters:  02/23/15 110/62   No changes needed Disc lifstyle change with low sodium diet and exercise  Labs rev      Hyperlipidemia    Disc goals for lipids and reasons to control them Rev labs with pt Rev low sat fat diet in detail  May consider changing lopid to statin in the future       Renal insufficiency    Rev renal labs Lab Results  Component Value Date   CREATININE 1.49* 02/18/2015   Up a bit  Reminded to drink more water when working

## 2015-02-23 NOTE — Patient Instructions (Signed)
B12 shot today  Increase water intake for kidney health  Decrease your glipizide ER to 2.5 mg daily -if blood glucose levels remain low please let me know  Please schedule annual exam with labs prior in about 6 months

## 2015-02-23 NOTE — Progress Notes (Signed)
Pre visit review using our clinic review tool, if applicable. No additional management support is needed unless otherwise documented below in the visit note. 

## 2015-02-24 NOTE — Assessment & Plan Note (Signed)
Lab Results  Component Value Date   HGBA1C 5.5 02/18/2015   Some hypoglycemia Will dec her glipizide ER to 2.5 mg and watch carefully  Diet is good More active/ wt loss noted

## 2015-02-24 NOTE — Assessment & Plan Note (Signed)
B12 shot today. 

## 2015-02-24 NOTE — Assessment & Plan Note (Signed)
bp in fair control at this time  BP Readings from Last 1 Encounters:  02/23/15 110/62   No changes needed Disc lifstyle change with low sodium diet and exercise  Labs rev

## 2015-02-24 NOTE — Assessment & Plan Note (Signed)
Rev renal labs Lab Results  Component Value Date   CREATININE 1.49* 02/18/2015   Up a bit  Reminded to drink more water when working

## 2015-02-24 NOTE — Assessment & Plan Note (Addendum)
Disc goals for lipids and reasons to control them Rev labs with pt Rev low sat fat diet in detail  May consider changing lopid to statin in the future

## 2015-03-10 ENCOUNTER — Telehealth: Payer: Self-pay | Admitting: Family Medicine

## 2015-03-10 DIAGNOSIS — E109 Type 1 diabetes mellitus without complications: Secondary | ICD-10-CM | POA: Diagnosis not present

## 2015-03-10 DIAGNOSIS — Z7689 Persons encountering health services in other specified circumstances: Secondary | ICD-10-CM

## 2015-03-10 DIAGNOSIS — Z961 Presence of intraocular lens: Secondary | ICD-10-CM | POA: Diagnosis not present

## 2015-03-10 NOTE — Telephone Encounter (Signed)
Pt left voicemail at Triage. Pt would like a copy of forms mailed to her once complete to keep for her records

## 2015-03-10 NOTE — Telephone Encounter (Signed)
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 Pt would like them mailed to [facility] (envelope is attached). On [place]'s desk  *Pt also dropped BP log. Log in Dr Glori Bickers in box

## 2015-03-11 NOTE — Telephone Encounter (Signed)
Forms in your inbox

## 2015-03-11 NOTE — Telephone Encounter (Signed)
She is having enough blood sugars in the 70s to stop the glipizide-go ahead and stop it completely and let me know if that causes any problems   I will take a look at her paperwork-please send this back to me, thanks

## 2015-03-14 NOTE — Telephone Encounter (Signed)
Pt notified of Dr. Marliss Coots instructions and will update [place] later in the week of her BS readings

## 2015-03-14 NOTE — Telephone Encounter (Signed)
I looked at forms and we need to document that she is not having any hypoglycemia (for safe driving) Keep an eye on blood sugars this week - and let me know at the end of the week if she has any readings below 70 or any that make her feel hypoglycemic I suspect off the glipizide she will not have any more lows  Let me know and then I can do her paperwork

## 2015-03-14 NOTE — Telephone Encounter (Signed)
Pt advise to stop glipizide and let us know if any problems, pt verbalized understanding   Note routed back to Dr. Glori Bickers so she can finish her paperwork

## 2015-03-17 NOTE — Telephone Encounter (Signed)
Pt left v/m with blood sugar log; on 03/15/15 BS was 83 and 03/16/15 BS was 93 and 03/17/15 BS was 101.

## 2015-03-18 NOTE — Telephone Encounter (Signed)
Good- forms done- please take glipizide off her med list- stay off of it

## 2015-03-18 NOTE — Telephone Encounter (Signed)
Left voicemail letting pt know we mailed forms off and sent a copy to her, and advised her of the $20 fee on voicemail

## 2015-04-12 ENCOUNTER — Other Ambulatory Visit: Payer: Self-pay | Admitting: Family Medicine

## 2015-04-13 NOTE — Telephone Encounter (Signed)
Please refill for a year  

## 2015-04-13 NOTE — Telephone Encounter (Signed)
Received faxed refill request from pharmacy Last refill 03/22/14 #45/3 Last office visit 02/23/15 Is it okay to refill medication?

## 2015-05-03 DIAGNOSIS — I10 Essential (primary) hypertension: Secondary | ICD-10-CM | POA: Diagnosis not present

## 2015-05-03 DIAGNOSIS — N183 Chronic kidney disease, stage 3 (moderate): Secondary | ICD-10-CM | POA: Diagnosis not present

## 2015-05-20 ENCOUNTER — Ambulatory Visit (INDEPENDENT_AMBULATORY_CARE_PROVIDER_SITE_OTHER): Payer: Medicare Other

## 2015-05-20 DIAGNOSIS — Z23 Encounter for immunization: Secondary | ICD-10-CM

## 2015-05-23 ENCOUNTER — Other Ambulatory Visit: Payer: Self-pay | Admitting: Family Medicine

## 2015-06-13 DIAGNOSIS — Z1231 Encounter for screening mammogram for malignant neoplasm of breast: Secondary | ICD-10-CM | POA: Diagnosis not present

## 2015-06-13 LAB — HM MAMMOGRAPHY: HM Mammogram: NORMAL

## 2015-06-14 ENCOUNTER — Encounter: Payer: Self-pay | Admitting: Family Medicine

## 2015-06-15 ENCOUNTER — Encounter: Payer: Self-pay | Admitting: *Deleted

## 2015-06-15 ENCOUNTER — Encounter: Payer: Self-pay | Admitting: Family Medicine

## 2015-06-16 ENCOUNTER — Telehealth: Payer: Self-pay | Admitting: Family Medicine

## 2015-06-16 MED ORDER — ALPRAZOLAM 0.5 MG PO TABS: 0.5000 mg | ORAL_TABLET | Freq: Two times a day (BID) | ORAL | Status: DC | PRN

## 2015-06-16 NOTE — Telephone Encounter (Signed)
Rx called in as prescribed  Pt advise Rx sent to pharmacy and advise of all the safety risk (fall, sedating, habit forming), and Dr. Marliss Coots comments, pt verbalized understanding

## 2015-06-16 NOTE — Telephone Encounter (Signed)
Please call in xanax Let her know it is sedating and also habit forming-use it only when needed and do not drive after using it  Also it can increase her fall risk  Let me know at any time if she would like to have counseling also  Let her know we are thinking about her

## 2015-06-16 NOTE — Telephone Encounter (Signed)
Pt called. She said her husband is dying and she would like you to call in something for her nerves. Please advise.  [facility]

## 2015-07-30 ENCOUNTER — Other Ambulatory Visit: Payer: Self-pay | Admitting: Family Medicine

## 2015-08-01 ENCOUNTER — Ambulatory Visit (INDEPENDENT_AMBULATORY_CARE_PROVIDER_SITE_OTHER): Payer: Medicare Other | Admitting: Ophthalmology

## 2015-08-01 DIAGNOSIS — H35033 Hypertensive retinopathy, bilateral: Secondary | ICD-10-CM | POA: Diagnosis not present

## 2015-08-01 DIAGNOSIS — E11319 Type 2 diabetes mellitus with unspecified diabetic retinopathy without macular edema: Secondary | ICD-10-CM | POA: Diagnosis not present

## 2015-08-01 DIAGNOSIS — H338 Other retinal detachments: Secondary | ICD-10-CM | POA: Diagnosis not present

## 2015-08-01 DIAGNOSIS — E113293 Type 2 diabetes mellitus with mild nonproliferative diabetic retinopathy without macular edema, bilateral: Secondary | ICD-10-CM

## 2015-08-01 DIAGNOSIS — H43813 Vitreous degeneration, bilateral: Secondary | ICD-10-CM

## 2015-08-01 DIAGNOSIS — I10 Essential (primary) hypertension: Secondary | ICD-10-CM | POA: Diagnosis not present

## 2015-08-01 DIAGNOSIS — H353122 Nonexudative age-related macular degeneration, left eye, intermediate dry stage: Secondary | ICD-10-CM

## 2015-08-01 NOTE — Telephone Encounter (Signed)
Rx called in as prescribed 

## 2015-08-01 NOTE — Telephone Encounter (Signed)
Px written for call in   

## 2015-08-01 NOTE — Telephone Encounter (Signed)
Electronic refill request, last refilled on 06/16/15 #30 with 1 additional refill (sig was for BID prn), pt has CPE scheduled for 08/30/15

## 2015-08-03 ENCOUNTER — Ambulatory Visit (INDEPENDENT_AMBULATORY_CARE_PROVIDER_SITE_OTHER): Payer: Medicare Other | Admitting: Ophthalmology

## 2015-08-18 ENCOUNTER — Encounter: Payer: Self-pay | Admitting: *Deleted

## 2015-08-24 DIAGNOSIS — I10 Essential (primary) hypertension: Secondary | ICD-10-CM | POA: Diagnosis not present

## 2015-08-24 DIAGNOSIS — N183 Chronic kidney disease, stage 3 (moderate): Secondary | ICD-10-CM | POA: Diagnosis not present

## 2015-08-24 DIAGNOSIS — R809 Proteinuria, unspecified: Secondary | ICD-10-CM | POA: Diagnosis not present

## 2015-08-30 ENCOUNTER — Ambulatory Visit (INDEPENDENT_AMBULATORY_CARE_PROVIDER_SITE_OTHER): Payer: Medicare Other | Admitting: Family Medicine

## 2015-08-30 ENCOUNTER — Encounter: Payer: Self-pay | Admitting: Family Medicine

## 2015-08-30 VITALS — BP 122/64 | HR 65 | Temp 97.4°F | Ht 62.25 in | Wt 178.8 lb

## 2015-08-30 DIAGNOSIS — N289 Disorder of kidney and ureter, unspecified: Secondary | ICD-10-CM | POA: Diagnosis not present

## 2015-08-30 DIAGNOSIS — E2839 Other primary ovarian failure: Secondary | ICD-10-CM

## 2015-08-30 DIAGNOSIS — E538 Deficiency of other specified B group vitamins: Secondary | ICD-10-CM | POA: Diagnosis not present

## 2015-08-30 DIAGNOSIS — F4321 Adjustment disorder with depressed mood: Secondary | ICD-10-CM

## 2015-08-30 DIAGNOSIS — M8580 Other specified disorders of bone density and structure, unspecified site: Secondary | ICD-10-CM

## 2015-08-30 DIAGNOSIS — Z0000 Encounter for general adult medical examination without abnormal findings: Secondary | ICD-10-CM | POA: Diagnosis not present

## 2015-08-30 DIAGNOSIS — I10 Essential (primary) hypertension: Secondary | ICD-10-CM | POA: Diagnosis not present

## 2015-08-30 DIAGNOSIS — E119 Type 2 diabetes mellitus without complications: Secondary | ICD-10-CM

## 2015-08-30 DIAGNOSIS — E785 Hyperlipidemia, unspecified: Secondary | ICD-10-CM | POA: Diagnosis not present

## 2015-08-30 DIAGNOSIS — F4320 Adjustment disorder, unspecified: Secondary | ICD-10-CM

## 2015-08-30 LAB — LIPID PANEL
Cholesterol: 185 mg/dL (ref 0–200)
HDL: 33.9 mg/dL — ABNORMAL LOW (ref >39.00)
NonHDL: 150.87
Total CHOL/HDL Ratio: 5
Triglycerides: 205 mg/dL — ABNORMAL HIGH (ref 0.0–149.0)
VLDL: 41 mg/dL — ABNORMAL HIGH (ref 0.0–40.0)

## 2015-08-30 LAB — CBC WITH DIFFERENTIAL/PLATELET
Basophils Absolute: 0 10*3/uL (ref 0.0–0.1)
Basophils Relative: 0.4 % (ref 0.0–3.0)
Eosinophils Absolute: 0.4 10*3/uL (ref 0.0–0.7)
Eosinophils Relative: 4.5 % (ref 0.0–5.0)
HCT: 36.9 % (ref 36.0–46.0)
Hemoglobin: 11.9 g/dL — ABNORMAL LOW (ref 12.0–15.0)
Lymphocytes Relative: 15.2 % (ref 12.0–46.0)
Lymphs Abs: 1.3 10*3/uL (ref 0.7–4.0)
MCHC: 32.4 g/dL (ref 30.0–36.0)
MCV: 85.3 fl (ref 78.0–100.0)
Monocytes Absolute: 0.5 10*3/uL (ref 0.1–1.0)
Monocytes Relative: 5.6 % (ref 3.0–12.0)
Neutro Abs: 6.3 10*3/uL (ref 1.4–7.7)
Neutrophils Relative %: 74.3 % (ref 43.0–77.0)
Platelets: 206 10*3/uL (ref 150.0–400.0)
RBC: 4.33 Mil/uL (ref 3.87–5.11)
RDW: 14.6 % (ref 11.5–15.5)
WBC: 8.4 10*3/uL (ref 4.0–10.5)

## 2015-08-30 LAB — COMPREHENSIVE METABOLIC PANEL
ALT: 11 U/L (ref 0–35)
AST: 13 U/L (ref 0–37)
Albumin: 3.7 g/dL (ref 3.5–5.2)
Alkaline Phosphatase: 87 U/L (ref 39–117)
BUN: 20 mg/dL (ref 6–23)
CO2: 28 mEq/L (ref 19–32)
Calcium: 9.2 mg/dL (ref 8.4–10.5)
Chloride: 105 mEq/L (ref 96–112)
Creatinine, Ser: 1.32 mg/dL — ABNORMAL HIGH (ref 0.40–1.20)
GFR: 40.71 mL/min — ABNORMAL LOW (ref >60.00)
Glucose, Bld: 172 mg/dL — ABNORMAL HIGH (ref 70–99)
Potassium: 4.4 mEq/L (ref 3.5–5.1)
Sodium: 141 mEq/L (ref 135–145)
Total Bilirubin: 0.4 mg/dL (ref 0.2–1.2)
Total Protein: 5.7 g/dL — ABNORMAL LOW (ref 6.0–8.3)

## 2015-08-30 LAB — TSH: TSH: 3.42 u[IU]/mL (ref 0.35–4.50)

## 2015-08-30 LAB — VITAMIN B12: Vitamin B-12: 206 pg/mL — ABNORMAL LOW (ref 211–911)

## 2015-08-30 LAB — HEMOGLOBIN A1C: Hgb A1c MFr Bld: 6.8 % — ABNORMAL HIGH (ref 4.6–6.5)

## 2015-08-30 LAB — LDL CHOLESTEROL, DIRECT: Direct LDL: 100 mg/dL

## 2015-08-30 MED ORDER — CYANOCOBALAMIN 1000 MCG/ML IJ SOLN
1000.0000 ug | Freq: Once | INTRAMUSCULAR | Status: AC
  Administered 2015-08-30: 1000 ug via INTRAMUSCULAR

## 2015-08-30 NOTE — Progress Notes (Signed)
Pre visit review using our clinic review tool, if applicable. No additional management support is needed unless otherwise documented below in the visit note. 

## 2015-08-30 NOTE — Progress Notes (Signed)
Subjective:    Patient ID: Renee [name], female    DOB: Jul 19, 1929, 82 y.o.   MRN: [id number]  HPI  Here for annual medicare wellness visit as well as chronic/acute medical problems   I have personally reviewed the Medicare Annual Wellness questionnaire and have noted 1. The patient's medical and social history 2. Their use of alcohol, tobacco or illicit drugs 3. Their current medications and supplements 4. The patient's functional ability including ADL's, fall risks, home safety risks and hearing or visual             impairment. 5. Diet and physical activities 6. Evidence for depression or mood disorders  The patients weight, height, BMI have been recorded in the chart and visual acuity is per eye clinic.  I have made referrals, counseling and provided education to the patient based review of the above and I have provided the pt with a written personalized care plan for preventive services. Reviewed and updated provider list, see scanned forms.  Lost her husband in 61 - after being married 39 years / he had CHF  She still has a caregiver 6 h per day 6 days per week    See scanned forms.  Routine anticipatory guidance given to patient.  See health maintenance. Colon cancer screening- colonoscopy 12/09 - polyps - rec recall 12/15 (she said later the doctor said it was no longer necessary for age) Breast cancer screening 10/16 nl  Self breast exam- no lumps  Flu vaccine 9/16 Tetanus vaccine Td 8/13 Pneumovax 9/15 complete with both  Zoster vaccine - she had shingles/not the vaccine, is not interested in one  dexa 9/14 - osteopenia , is interested in having another one , no falls or fx recently, she takes her ca and D , exercise is limited from cva  Advance directive- has a living will and POA  Cognitive function addressed- see scanned forms- and if abnormal then additional documentation follows. Memory is overall very good/no problems   PMH and SH reviewed  Meds, vitals, and  allergies reviewed.   ROS: See HPI.  Otherwise negative.    Due for labs  Hx of low B12 in the past Lab Results  Component Value Date   VITAMINB12 247 04/20/2014    Blood sugar is a little higher than average - 145 this am No low readings  Due for A1C today  Appetite is ok  Trying to eat healthy   Eyes are a "bit weaker"- went for exam in July- hx of retinal detatchment with tx  Will need new glasses soon   Cholesterol  Due for a check  On lopid  Thinks diet is the same   bp is stable today  No cp or palpitations or headaches or edema  No side effects to medicines  BP Readings from Last 3 Encounters:  08/30/15 122/64  02/23/15 110/62  11/23/14 130/80     Grief reaction  Good days and bad days  Some days she is "really nervous" She talks to her caregiver- is a good support Declines counseling   Saw her renal specialist last wk  GFR slt improved  Stopped soda  occ sweet tea  Drinks more water   Patient Active Problem List   Diagnosis Date Noted  . Estrogen deficiency 08/30/2015  . Grief reaction 08/30/2015  . UTI (urinary tract infection) 05/31/2014  . Abnormal TSH 04/27/2014  . Encounter for Medicare annual wellness exam 04/17/2013  . Mobility impaired 06/26/2011  . History  of retinal detachment 01/10/2011  . Sleep apnea 11/28/2010  . Depression with anxiety 08/25/2010  . CVA WITH LEFT HEMIPARESIS 07/05/2010  . POSTHERPETIC NEURALGIA 11/09/2009  . Osteopenia 12/20/2008  . MUSCLE WEAKNESS (GENERALIZED) 08/03/2008  . Renal insufficiency 06/29/2008  . BACK PAIN 01/26/2008  . EDEMA 01/26/2008  . B12 deficiency 01/10/2007  . Diabetes type 2, controlled ([place]) 11/27/2006  . Hyperlipidemia 11/27/2006  . Essential hypertension 11/27/2006  . FIBROCYSTIC BREAST DISEASE 11/27/2006  . ROSACEA 11/27/2006  . OSTEOARTHRITIS 11/27/2006  . URINARY INCONTINENCE, MIXED 11/27/2006  . ANGIOEDEMA 11/27/2006   Past Medical History  Diagnosis Date  . Sleep apnea   .  Diabetes mellitus     type II  . Hypertension   . Hyperlipidemia   . Stroke [facility]) 05/2010    Small vessel sobcortical (in [place] trial) with Dr Leonie Man, residual L hemiparesis  . Osteoarthritis   . Angioedema     possibly from voltaren  . Vitamin B 12 deficiency 04/08  . LVH (left ventricular hypertrophy)     and atrial enlargement by echo in past with nl EF  . Degenerative disc disease   . Osteopenia   . Nasal pruritis   . Renal insufficiency    Past Surgical History  Procedure Laterality Date  . Eye surgery      cataract extraction  . Abdominal hysterectomy      BSO-fibroids  . Knee surgery      arthroscope  . Spine surgery  08/09    spinal decompression surgery  . Retinal detachment surgery  02/18/11    times 2  . Back surgery    . Colon surgery      due to punctured intestines  . Appendectomy    . Pars plana vitrectomy  07/31/2011    Procedure: PARS PLANA VITRECTOMY WITH 25 GAUGE;  Surgeon: [name], MD;  Location: [place];  Service: Ophthalmology;  Laterality: Right;  REMOVAL OF SILICONE OIL AND LASER RIGHT EYE   Social History  Substance Use Topics  . Smoking status: Never Smoker   . Smokeless tobacco: Never Used  . Alcohol Use: No   Family History  Problem Relation Age of Onset  . Cancer Sister     brain tumor with hemmorhage  . Heart disease Sister     CAD  . COPD Brother    Allergies  Allergen Reactions  . Diclofenac Sodium     REACTION: angioedema  . Pioglitazone     REACTION: swelling   Current Outpatient Prescriptions on File Prior to Visit  Medication Sig Dispense Refill  . acetaminophen (TYLENOL) 500 MG tablet Take 500 mg by mouth 2 (two) times daily.    Marland Kitchen ALPRAZolam (XANAX) 0.5 MG tablet TAKE 1 TABLET BY MOUTH TWICE A DAY AS NEEDED FOR ANXIETY (SEDATION WARNING) 30 tablet 3  . amLODipine (NORVASC) 10 MG tablet Take 1 tablet (10 mg total) by mouth daily. 90 tablet 3  . aspirin 325 MG tablet Take 325 mg by mouth daily.     . calcium-vitamin  D (OSCAL WITH D) 500-200 MG-UNIT per tablet Take 1 tablet by mouth daily.     . Cholecalciferol (VITAMIN D) 1000 UNITS capsule Take 1,000 Units by mouth daily.     . cyanocobalamin (,VITAMIN B-12,) 1000 MCG/ML injection Inject 1,000 mcg into the muscle every 30 (thirty) days.     . ferrous sulfate 325 (65 FE) MG tablet Take 325 mg by mouth 2 (two) times daily.    Marland Kitchen  gemfibrozil (LOPID) 600 MG tablet TAKE 1 TABLET EVERY DAY 90 tablet 3  . glipiZIDE (GLUCOTROL XL) 2.5 MG 24 hr tablet Take 1 tablet (2.5 mg total) by mouth daily with breakfast. 30 tablet 11  . hydrALAZINE (APRESOLINE) 100 MG tablet     . metoprolol succinate (TOPROL-XL) 25 MG 24 hr tablet Take 0.5 tablets (12.5 mg total) by mouth 2 (two) times daily. 270 tablet 3  . mirtazapine (REMERON) 15 MG tablet TAKE 1/2 TABLET AT BEDTIME 45 tablet 11  . Multiple Vitamin (MULTIVITAMIN) capsule Take 1 capsule by mouth daily.     . sertraline (ZOLOFT) 50 MG tablet Take 1 tablet (50 mg total) by mouth daily. 90 tablet 3  . trandolapril (MAVIK) 4 MG tablet TAKE 1 TABLET EVERY DAY 90 tablet 1   No current facility-administered medications on file prior to visit.    Review of Systems Review of Systems  Constitutional: Negative for fever, appetite change, and unexpected weight change. pos for fatigue and gen weakness  Eyes: Negative for pain and visual disturbance.  Respiratory: Negative for cough and shortness of breath.   Cardiovascular: Negative for cp or palpitations    Gastrointestinal: Negative for nausea, diarrhea and constipation.  Genitourinary: Negative for urgency and frequency.  Skin: Negative for pallor or rash   Neurological: Negative for , light-headedness, numbness and headaches. pos for baseline L hemiparesis in UEs  Hematological: Negative for adenopathy. Does not bruise/bleed easily.  Psychiatric/Behavioral: Negative for dysphoric mood. The patient is anxious, pos for grief         Objective:   Physical Exam    Constitutional: She appears well-developed and well-nourished. No distress.  obese and well appearing   HENT:  Head: Normocephalic and atraumatic.  Right Ear: External ear normal.  Left Ear: External ear normal.  Mouth/Throat: Oropharynx is clear and moist.  Eyes: Conjunctivae and EOM are normal. Pupils are equal, round, and reactive to light. No scleral icterus.  Neck: Normal range of motion. Neck supple. No JVD present. Carotid bruit is not present. No thyromegaly present.  Cardiovascular: Normal rate, regular rhythm, normal heart sounds and intact distal pulses.  Exam reveals no gallop.   Pulmonary/Chest: Effort normal and breath sounds normal. No respiratory distress. She has no wheezes. She exhibits no tenderness.  Abdominal: Soft. Bowel sounds are normal. She exhibits no distension, no abdominal bruit and no mass. There is no tenderness.  Genitourinary: No breast swelling, tenderness, discharge or bleeding.  Breast exam: No mass, nodules, thickening, tenderness, bulging, retraction, inflamation, nipple discharge or skin changes noted.  No axillary or clavicular LA.      Musculoskeletal: Normal range of motion. She exhibits no edema or tenderness.  Lymphadenopathy:    She has no cervical adenopathy.  Neurological: She is alert. She has normal reflexes. She displays no atrophy. No cranial nerve deficit. She exhibits normal muscle tone. Gait abnormal. Coordination normal.  Baseline L UE hemiparesis  Can ambulate only with assistance   Skin: Skin is warm and dry. No rash noted. No erythema. No pallor.  sks diffusely  Psychiatric: She has a normal mood and affect.  Pleasant           Assessment & Plan:   Problem List Items Addressed This Visit      Cardiovascular and Mediastinum   Essential hypertension - Primary    bp in fair control at this time  BP Readings from Last 1 Encounters:  08/30/15 122/64   No changes needed Disc lifstyle change  with low sodium diet and  exercise  Labs ordered      Relevant Orders   Comprehensive metabolic panel (Completed)   CBC with Differential/Platelet (Completed)   TSH (Completed)   Lipid panel (Completed)     Digestive   B12 deficiency    Level today and then scheduled shot  Pt is more fatigued       Relevant Medications   cyanocobalamin ((VITAMIN B-12)) injection 1,000 mcg (Completed)   Other Relevant Orders   Vitamin B12 (Completed)     Endocrine   Diabetes type 2, controlled (HCC)    A1C today This has been well controlled       Relevant Orders   Hemoglobin A1c (Completed)     Musculoskeletal and Integument   Osteopenia    Due for 2 y dexa -will schedule No falls or fx Disc need for calcium/ vitamin D/ wt bearing exercise and bone density test every 2 y to monitor Disc safety/ fracture risk in detail          Genitourinary   Renal insufficiency    Multifactorial Per pt improved at last renal visit Continues to inc water intake       Relevant Orders   Comprehensive metabolic panel (Completed)     Other   Encounter for Medicare annual wellness exam    Reviewed health habits including diet and exercise and skin cancer prevention Reviewed appropriate screening tests for age  Also reviewed health mt list, fam hx and immunization status , as well as social and family history   See HPI Lab today B12 shot today  Labs today  Stop at check out for bone density test referral  Continue current medicines I will work on your [employer] forms and mail them to you       Estrogen deficiency   Relevant Orders   DG Bone Density   Grief reaction    From loss of husband this fall  Reviewed stressors/ coping techniques/symptoms/ support sources/ tx options and side effects in detail today She declines counseling and thinks she is doing ok overall Has lots of support  Enc socialization       Hyperlipidemia    Lipid panel today On lopid Diet fair LDL has been creeping up lately       Relevant Orders   Lipid panel (Completed)

## 2015-08-30 NOTE — Patient Instructions (Signed)
B12 shot today  Labs today  Stop at check out for bone density test referral  Continue current medicines I will work on your [employer] forms and mail them to you

## 2015-08-31 ENCOUNTER — Encounter: Payer: Self-pay | Admitting: *Deleted

## 2015-08-31 NOTE — Assessment & Plan Note (Signed)
Lipid panel today On lopid Diet fair LDL has been creeping up lately

## 2015-08-31 NOTE — Assessment & Plan Note (Signed)
Level today and then scheduled shot  Pt is more fatigued

## 2015-08-31 NOTE — Assessment & Plan Note (Signed)
Multifactorial Per pt improved at last renal visit Continues to inc water intake

## 2015-08-31 NOTE — Assessment & Plan Note (Signed)
From loss of husband this fall  Reviewed stressors/ coping techniques/symptoms/ support sources/ tx options and side effects in detail today She declines counseling and thinks she is doing ok overall Has lots of support  Enc socialization

## 2015-08-31 NOTE — Assessment & Plan Note (Signed)
A1C today This has been well controlled

## 2015-08-31 NOTE — Assessment & Plan Note (Signed)
Due for 2 y dexa -will schedule No falls or fx Disc need for calcium/ vitamin D/ wt bearing exercise and bone density test every 2 y to monitor Disc safety/ fracture risk in detail

## 2015-08-31 NOTE — Assessment & Plan Note (Signed)
Reviewed health habits including diet and exercise and skin cancer prevention Reviewed appropriate screening tests for age  Also reviewed health mt list, fam hx and immunization status , as well as social and family history   See HPI Lab today B12 shot today  Labs today  Stop at check out for bone density test referral  Continue current medicines I will work on your [employer] forms and mail them to you

## 2015-08-31 NOTE — Assessment & Plan Note (Addendum)
bp in fair control at this time  BP Readings from Last 1 Encounters:  08/30/15 122/64   No changes needed Disc lifstyle change with low sodium diet and exercise  Labs ordered

## 2015-09-07 ENCOUNTER — Ambulatory Visit (INDEPENDENT_AMBULATORY_CARE_PROVIDER_SITE_OTHER)
Admission: RE | Admit: 2015-09-07 | Discharge: 2015-09-07 | Disposition: A | Discharge status: Home / Self Care | Payer: Medicare Other | Source: Ambulatory Visit | Attending: Family Medicine | Admitting: Family Medicine

## 2015-09-07 DIAGNOSIS — E2839 Other primary ovarian failure: Secondary | ICD-10-CM

## 2015-09-07 LAB — HM DEXA SCAN

## 2015-09-09 ENCOUNTER — Encounter: Payer: Self-pay | Admitting: Family Medicine

## 2015-09-09 ENCOUNTER — Encounter: Payer: Self-pay | Admitting: *Deleted

## 2015-09-27 ENCOUNTER — Ambulatory Visit (INDEPENDENT_AMBULATORY_CARE_PROVIDER_SITE_OTHER): Payer: Medicare Other | Admitting: Family Medicine

## 2015-09-27 ENCOUNTER — Encounter: Payer: Self-pay | Admitting: Family Medicine

## 2015-09-27 VITALS — BP 132/74 | HR 65 | Temp 98.0°F | Ht 62.25 in | Wt 181.5 lb

## 2015-09-27 DIAGNOSIS — H6120 Impacted cerumen, unspecified ear: Secondary | ICD-10-CM | POA: Insufficient documentation

## 2015-09-27 DIAGNOSIS — H6121 Impacted cerumen, right ear: Secondary | ICD-10-CM

## 2015-09-27 NOTE — Patient Instructions (Signed)
You have wax in your right ear  Since irrigating it was too painful- we will send you to an ear specialist to see if they can help  Stop at check out for the referral

## 2015-09-27 NOTE — Progress Notes (Signed)
Subjective:    Patient ID: Renee [name], female    DOB: 1929-02-08, 82 y.o.   MRN: [id number]  HPI Here with R ear pain  Started 2 weeks ago  It is popping (after it pops she can hear a bit better) Not draining  Decreased hearing from it  No dizziness   A little congested Has a mild cold  No sinus pain No fever   Some post nasal drip  No cough    No otc med   Patient Active Problem List   Diagnosis Date Noted  . Estrogen deficiency 08/30/2015  . Grief reaction 08/30/2015  . UTI (urinary tract infection) 05/31/2014  . Abnormal TSH 04/27/2014  . Encounter for Medicare annual wellness exam 04/17/2013  . Mobility impaired 06/26/2011  . History of retinal detachment 01/10/2011  . Sleep apnea 11/28/2010  . Depression with anxiety 08/25/2010  . CVA WITH LEFT HEMIPARESIS 07/05/2010  . POSTHERPETIC NEURALGIA 11/09/2009  . Osteopenia 12/20/2008  . MUSCLE WEAKNESS (GENERALIZED) 08/03/2008  . Renal insufficiency 06/29/2008  . BACK PAIN 01/26/2008  . EDEMA 01/26/2008  . B12 deficiency 01/10/2007  . Diabetes type 2, controlled ([place]) 11/27/2006  . Hyperlipidemia 11/27/2006  . Essential hypertension 11/27/2006  . FIBROCYSTIC BREAST DISEASE 11/27/2006  . ROSACEA 11/27/2006  . OSTEOARTHRITIS 11/27/2006  . URINARY INCONTINENCE, MIXED 11/27/2006  . ANGIOEDEMA 11/27/2006   Past Medical History  Diagnosis Date  . Sleep apnea   . Diabetes mellitus     type II  . Hypertension   . Hyperlipidemia   . Stroke [facility]) 05/2010    Small vessel sobcortical (in [place] trial) with Dr Leonie Man, residual L hemiparesis  . Osteoarthritis   . Angioedema     possibly from voltaren  . Vitamin B 12 deficiency 04/08  . LVH (left ventricular hypertrophy)     and atrial enlargement by echo in past with nl EF  . Degenerative disc disease   . Osteopenia   . Nasal pruritis   . Renal insufficiency    Past Surgical History  Procedure Laterality Date  . Eye surgery      cataract extraction  .  Abdominal hysterectomy      BSO-fibroids  . Knee surgery      arthroscope  . Spine surgery  08/09    spinal decompression surgery  . Retinal detachment surgery  02/18/11    times 2  . Back surgery    . Colon surgery      due to punctured intestines  . Appendectomy    . Pars plana vitrectomy  07/31/2011    Procedure: PARS PLANA VITRECTOMY WITH 25 GAUGE;  Surgeon: [name], MD;  Location: [place];  Service: Ophthalmology;  Laterality: Right;  REMOVAL OF SILICONE OIL AND LASER RIGHT EYE   Social History  Substance Use Topics  . Smoking status: Never Smoker   . Smokeless tobacco: Never Used  . Alcohol Use: No   Family History  Problem Relation Age of Onset  . Cancer Sister     brain tumor with hemmorhage  . Heart disease Sister     CAD  . COPD Brother    Allergies  Allergen Reactions  . Diclofenac Sodium     REACTION: angioedema  . Pioglitazone     REACTION: swelling   Current Outpatient Prescriptions on File Prior to Visit  Medication Sig Dispense Refill  . acetaminophen (TYLENOL) 500 MG tablet Take 500 mg by mouth 2 (two) times daily.    Marland Kitchen  ALPRAZolam (XANAX) 0.5 MG tablet TAKE 1 TABLET BY MOUTH TWICE A DAY AS NEEDED FOR ANXIETY (SEDATION WARNING) 30 tablet 3  . amLODipine (NORVASC) 10 MG tablet Take 1 tablet (10 mg total) by mouth daily. 90 tablet 3  . aspirin 325 MG tablet Take 325 mg by mouth daily.     . calcium-vitamin D (OSCAL WITH D) 500-200 MG-UNIT per tablet Take 1 tablet by mouth daily.     . Cholecalciferol (VITAMIN D) 1000 UNITS capsule Take 1,000 Units by mouth daily.     . cyanocobalamin (,VITAMIN B-12,) 1000 MCG/ML injection Inject 1,000 mcg into the muscle every 30 (thirty) days.     . ferrous sulfate 325 (65 FE) MG tablet Take 325 mg by mouth 2 (two) times daily.    Marland Kitchen gemfibrozil (LOPID) 600 MG tablet TAKE 1 TABLET EVERY DAY 90 tablet 3  . glipiZIDE (GLUCOTROL XL) 2.5 MG 24 hr tablet Take 1 tablet (2.5 mg total) by mouth daily with breakfast. 30 tablet  11  . hydrALAZINE (APRESOLINE) 100 MG tablet     . metoprolol succinate (TOPROL-XL) 25 MG 24 hr tablet Take 0.5 tablets (12.5 mg total) by mouth 2 (two) times daily. 270 tablet 3  . mirtazapine (REMERON) 15 MG tablet TAKE 1/2 TABLET AT BEDTIME 45 tablet 11  . Multiple Vitamin (MULTIVITAMIN) capsule Take 1 capsule by mouth daily.     . sertraline (ZOLOFT) 50 MG tablet Take 1 tablet (50 mg total) by mouth daily. 90 tablet 3  . trandolapril (MAVIK) 4 MG tablet TAKE 1 TABLET EVERY DAY 90 tablet 1   No current facility-administered medications on file prior to visit.    Review of Systems Review of Systems  Constitutional: Negative for fever, appetite change, fatigue and unexpected weight change.  ENT pos for dec hearing in R ear , neg for sinus pain or st  Eyes: Negative for pain and visual disturbance.  Respiratory: Negative for cough and shortness of breath.   Cardiovascular: Negative for cp or palpitations    Gastrointestinal: Negative for nausea, diarrhea and constipation.  Genitourinary: Negative for urgency and frequency.  Skin: Negative for pallor or rash   Neurological: Negative for weakness, light-headedness, numbness and headaches.  Hematological: Negative for adenopathy. Does not bruise/bleed easily.  Psychiatric/Behavioral: Negative for dysphoric mood. The patient is not nervous/anxious.         Objective:   Physical Exam  Constitutional: She appears well-developed and well-nourished. No distress.  obese and well appearing   HENT:  Head: Normocephalic and atraumatic.  Left Ear: External ear normal.  Mouth/Throat: Oropharynx is clear and moist.  Nares are boggy  R ear canal has wet cerumen impaction - no redness or swelling of canal  L TM is nl appearing  Eyes: Conjunctivae and EOM are normal. Pupils are equal, round, and reactive to light. Right eye exhibits no discharge. Left eye exhibits no discharge.  Neck: Normal range of motion. Neck supple.  Cardiovascular: Normal  rate and regular rhythm.   Pulmonary/Chest: Effort normal and breath sounds normal.  Lymphadenopathy:    She has no cervical adenopathy.  Skin: Skin is warm and dry. No rash noted. No erythema. No pallor.  Psychiatric: She has a normal mood and affect.          Assessment & Plan:   Problem List Items Addressed This Visit      Nervous and Auditory   Cerumen impaction - Primary    Pt has a wet cerumen impaction (no  redness of canal obs) and cannot tolerate irrigation (saying it is painful)- puzzling  ? If she has OM or OE in conj with this that is not visible Ref to ENT for eval and tx       Relevant Orders   Ambulatory referral to ENT

## 2015-09-27 NOTE — Progress Notes (Signed)
Pre visit review using our clinic review tool, if applicable. No additional management support is needed unless otherwise documented below in the visit note. 

## 2015-09-28 DIAGNOSIS — H6123 Impacted cerumen, bilateral: Secondary | ICD-10-CM | POA: Diagnosis not present

## 2015-09-29 NOTE — Assessment & Plan Note (Signed)
Pt has a wet cerumen impaction (no redness of canal obs) and cannot tolerate irrigation (saying it is painful)- puzzling  ? If she has OM or OE in conj with this that is not visible Ref to ENT for eval and tx

## 2015-10-03 ENCOUNTER — Other Ambulatory Visit: Payer: Self-pay | Admitting: Family Medicine

## 2015-10-04 ENCOUNTER — Ambulatory Visit (INDEPENDENT_AMBULATORY_CARE_PROVIDER_SITE_OTHER): Payer: Medicare Other | Admitting: Primary Care

## 2015-10-04 ENCOUNTER — Encounter: Payer: Self-pay | Admitting: Primary Care

## 2015-10-04 VITALS — BP 122/78 | HR 75 | Temp 98.1°F | Ht 62.25 in | Wt 175.8 lb

## 2015-10-04 DIAGNOSIS — R059 Cough, unspecified: Secondary | ICD-10-CM

## 2015-10-04 DIAGNOSIS — R05 Cough: Secondary | ICD-10-CM

## 2015-10-04 MED ORDER — AZITHROMYCIN 250 MG PO TABS: ORAL_TABLET | ORAL | Status: DC

## 2015-10-04 NOTE — Patient Instructions (Addendum)
Start Azithromycin antibiotics. Take 2 tablets by mouth today, then 1 tablet daily for 4 additional days.  Use 2 puffs of your albuterol inhaler every 6 hours as needed for shortness of breath and wheezing.   Continue sugar free Robitussin as needed for cough.  Increase consumption of water to stay hydrated.   Please call me if you've not experienced improvement in 3-4 days.  It was a pleasure meeting you!

## 2015-10-04 NOTE — Progress Notes (Signed)
Subjective:    Patient ID: Renee [name], female    DOB: 1929/03/27, 82 y.o.   MRN: [id number]  HPI  Renee [name] is an 82 year old female who presents today with a chief complaint of cough. She also reports weakness, nasal congestion, sore throat, body aches, chills, fatigue. Her symptoms have been present since Saturday night. Her cough is productive with yellow sputum. Denies fevers, nausea. She's taken Coricidin and sugar free Robitussin with some reduction in symptoms. Overall she's feeling worse and is not up to her usual activities.   Denies prior smoking history. No history of COPD/Asthma.  Review of Systems  Constitutional: Positive for chills and fatigue. Negative for fever.  HENT: Positive for congestion and sore throat.   Respiratory: Positive for cough and shortness of breath.   Musculoskeletal: Positive for myalgias.  Neurological: Positive for weakness.       Past Medical History  Diagnosis Date  . Sleep apnea   . Diabetes mellitus     type II  . Hypertension   . Hyperlipidemia   . Stroke [facility]) 05/2010    Small vessel sobcortical (in [place] trial) with Dr Leonie Man, residual L hemiparesis  . Osteoarthritis   . Angioedema     possibly from voltaren  . Vitamin B 12 deficiency 04/08  . LVH (left ventricular hypertrophy)     and atrial enlargement by echo in past with nl EF  . Degenerative disc disease   . Osteopenia   . Nasal pruritis   . Renal insufficiency     Social History   Social History  . Marital Status: Married    Spouse Name: N/A  . Number of Children: N/A  . Years of Education: N/A   Occupational History  . Not on file.   Social History Main Topics  . Smoking status: Never Smoker   . Smokeless tobacco: Never Used  . Alcohol Use: No  . Drug Use: No  . Sexual Activity: Not on file   Other Topics Concern  . Not on file   Social History Narrative    Past Surgical History  Procedure Laterality Date  . Eye surgery      cataract  extraction  . Abdominal hysterectomy      BSO-fibroids  . Knee surgery      arthroscope  . Spine surgery  08/09    spinal decompression surgery  . Retinal detachment surgery  02/18/11    times 2  . Back surgery    . Colon surgery      due to punctured intestines  . Appendectomy    . Pars plana vitrectomy  07/31/2011    Procedure: PARS PLANA VITRECTOMY WITH 25 GAUGE;  Surgeon: [name], MD;  Location: [place];  Service: Ophthalmology;  Laterality: Right;  REMOVAL OF SILICONE OIL AND LASER RIGHT EYE    Family History  Problem Relation Age of Onset  . Cancer Sister     brain tumor with hemmorhage  . Heart disease Sister     CAD  . COPD Brother     Allergies  Allergen Reactions  . Diclofenac Sodium     REACTION: angioedema  . Pioglitazone     REACTION: swelling    Current Outpatient Prescriptions on File Prior to Visit  Medication Sig Dispense Refill  . acetaminophen (TYLENOL) 500 MG tablet Take 500 mg by mouth 2 (two) times daily.    Marland Kitchen ALPRAZolam (XANAX) 0.5 MG tablet TAKE 1 TABLET BY MOUTH TWICE  A DAY AS NEEDED FOR ANXIETY (SEDATION WARNING) 30 tablet 3  . amLODipine (NORVASC) 10 MG tablet Take 1 tablet (10 mg total) by mouth daily. 90 tablet 3  . aspirin 325 MG tablet Take 325 mg by mouth daily.     . calcium-vitamin D (OSCAL WITH D) 500-200 MG-UNIT per tablet Take 1 tablet by mouth daily.     . Cholecalciferol (VITAMIN D) 1000 UNITS capsule Take 1,000 Units by mouth daily.     . cyanocobalamin (,VITAMIN B-12,) 1000 MCG/ML injection Inject 1,000 mcg into the muscle every 30 (thirty) days.     . ferrous sulfate 325 (65 FE) MG tablet Take 325 mg by mouth 2 (two) times daily.    Marland Kitchen gemfibrozil (LOPID) 600 MG tablet TAKE 1 TABLET EVERY DAY 90 tablet 3  . glipiZIDE (GLUCOTROL XL) 2.5 MG 24 hr tablet Take 1 tablet (2.5 mg total) by mouth daily with breakfast. 30 tablet 11  . hydrALAZINE (APRESOLINE) 100 MG tablet     . metoprolol succinate (TOPROL-XL) 25 MG 24 hr tablet Take  0.5 tablets (12.5 mg total) by mouth 2 (two) times daily. 270 tablet 3  . mirtazapine (REMERON) 15 MG tablet TAKE 1/2 TABLET AT BEDTIME 45 tablet 11  . Multiple Vitamin (MULTIVITAMIN) capsule Take 1 capsule by mouth daily.     . sertraline (ZOLOFT) 50 MG tablet Take 1 tablet (50 mg total) by mouth daily. 90 tablet 3  . trandolapril (MAVIK) 4 MG tablet TAKE 1 TABLET EVERY DAY 90 tablet 0   No current facility-administered medications on file prior to visit.    BP 122/78 mmHg  Pulse 75  Temp(Src) 98.1 F (36.7 C) (Oral)  Ht 5' 2.25" (1.581 m)  Wt 175 lb 12.8 oz (79.742 kg)  BMI 31.90 kg/m2  SpO2 98%    Objective:   Physical Exam  Constitutional: She appears well-nourished.  HENT:  Right Ear: Tympanic membrane and ear canal normal.  Left Ear: Tympanic membrane and ear canal normal.  Nose: Right sinus exhibits no maxillary sinus tenderness and no frontal sinus tenderness. Left sinus exhibits no maxillary sinus tenderness and no frontal sinus tenderness.  Mouth/Throat: Oropharynx is clear and moist.  Neck: Neck supple.  Cardiovascular: Normal rate and regular rhythm.   Pulmonary/Chest: She has no decreased breath sounds. She has no wheezes. She has rhonchi in the right upper field, the right middle field, the right lower field, the left upper field and the left lower field.  Skin: Skin is warm and dry.          Assessment & Plan:  URI:  Cough, fatigue, weakness, chills, SOB x 3 days. Exam with moderate rhonchi throughout all fields, no wheezing. Does appear ill, but not toxic. Due to presentation and examination will treat for presumed bacterial involvement. RX for Zpak provided. Continue albuterol inhaler at home. Continue Robitussin. Fluids, rest, return precautions provided.

## 2015-10-04 NOTE — Progress Notes (Signed)
Pre visit review using our clinic review tool, if applicable. No additional management support is needed unless otherwise documented below in the visit note. 

## 2015-10-06 ENCOUNTER — Other Ambulatory Visit: Payer: Self-pay | Admitting: Primary Care

## 2015-10-06 ENCOUNTER — Telehealth: Payer: Self-pay | Admitting: Primary Care

## 2015-10-06 DIAGNOSIS — R05 Cough: Secondary | ICD-10-CM

## 2015-10-06 DIAGNOSIS — R059 Cough, unspecified: Secondary | ICD-10-CM

## 2015-10-06 MED ORDER — BENZONATATE 200 MG PO CAPS: 200.0000 mg | ORAL_CAPSULE | Freq: Three times a day (TID) | ORAL | Status: DC | PRN

## 2015-10-06 MED ORDER — AZITHROMYCIN 250 MG PO TABS: ORAL_TABLET | ORAL | Status: DC

## 2015-10-06 NOTE — Telephone Encounter (Addendum)
Gina left v/m requesting cb(pt gave verbal permission to speak with Barnett Applebaum) about azithromycin;one pill got wet and requesting another pill called in as substitute to [facility]; pt is also requesting a prescription for an inhaler and solution [facility]. Pt has taken 4 pills of azithromycin; pt has one more pill to take on 10/07/15; pt has prod cough with yellow-green phlegm; pt is coughing a lot. Pt has previously used her husband's albuterol inhaler and wants the liquid that goes with the inhaler (not a nebulizer solution). Call back on 10/07/15 will be OK.

## 2015-10-06 NOTE — Telephone Encounter (Signed)
Pt left v/m;pt was seen 10/04/15; pt dropped a azithromycin and then spilled water on the pill and pill dissolved. Pt request on azithromycin. Pt request cb.

## 2015-10-06 NOTE — Telephone Encounter (Signed)
See RX refill note. How many pills has she taken? How many does she have left?

## 2015-10-06 NOTE — Telephone Encounter (Signed)
Per refill encounter.  Pt left v/m;pt was seen 10/04/15; pt dropped a azithromycin and then spilled water on the pill and pill dissolved. Pt request on azithromycin.

## 2015-10-06 NOTE — Telephone Encounter (Signed)
[facility] 1 tablet of [facility]. Also sent in tessalon pearls for her to use as needed for cough. Take 1 capsule by mouth once every 8 hours as needed for cough. I'm not exactly sure what she's referring to when she asks for the "albuterol solution that is not nebulized". I'm happy to send in an inhaler. Please clarify.

## 2015-10-06 NOTE — Telephone Encounter (Signed)
Electronically refill request for   azithromycin (ZITHROMAX) 250 MG tablet   Take 2 tablets by mouth today, then 1 tablet daily for 4 additional days.  Dispense: 6 tablet   Refills: 0     Was seen and prescribed on 10/04/2015.

## 2015-10-07 ENCOUNTER — Inpatient Hospital Stay (HOSPITAL_COMMUNITY)
Admission: EM | Admit: 2015-10-07 | Discharge: 2015-10-12 | DRG: 193 | Disposition: A | Discharge status: Home / Self Care | Payer: Medicare Other | Attending: Internal Medicine | Admitting: Internal Medicine

## 2015-10-07 ENCOUNTER — Emergency Department (HOSPITAL_COMMUNITY): Payer: Medicare Other

## 2015-10-07 ENCOUNTER — Other Ambulatory Visit: Payer: Self-pay

## 2015-10-07 ENCOUNTER — Encounter (HOSPITAL_COMMUNITY): Payer: Self-pay

## 2015-10-07 ENCOUNTER — Telehealth: Payer: Self-pay | Admitting: Primary Care

## 2015-10-07 DIAGNOSIS — N183 Chronic kidney disease, stage 3 unspecified: Secondary | ICD-10-CM | POA: Diagnosis present

## 2015-10-07 DIAGNOSIS — E1122 Type 2 diabetes mellitus with diabetic chronic kidney disease: Secondary | ICD-10-CM

## 2015-10-07 DIAGNOSIS — I10 Essential (primary) hypertension: Secondary | ICD-10-CM | POA: Diagnosis not present

## 2015-10-07 DIAGNOSIS — E872 Acidosis: Secondary | ICD-10-CM | POA: Diagnosis present

## 2015-10-07 DIAGNOSIS — J181 Lobar pneumonia, unspecified organism: Secondary | ICD-10-CM | POA: Diagnosis present

## 2015-10-07 DIAGNOSIS — I1310 Hypertensive heart and chronic kidney disease without heart failure, with stage 1 through stage 4 chronic kidney disease, or unspecified chronic kidney disease: Secondary | ICD-10-CM | POA: Diagnosis present

## 2015-10-07 DIAGNOSIS — E870 Hyperosmolality and hypernatremia: Secondary | ICD-10-CM | POA: Diagnosis present

## 2015-10-07 DIAGNOSIS — D509 Iron deficiency anemia, unspecified: Secondary | ICD-10-CM | POA: Diagnosis present

## 2015-10-07 DIAGNOSIS — N1832 Chronic kidney disease, stage 3b: Secondary | ICD-10-CM | POA: Diagnosis present

## 2015-10-07 DIAGNOSIS — E785 Hyperlipidemia, unspecified: Secondary | ICD-10-CM | POA: Diagnosis present

## 2015-10-07 DIAGNOSIS — Z888 Allergy status to other drugs, medicaments and biological substances status: Secondary | ICD-10-CM | POA: Diagnosis not present

## 2015-10-07 DIAGNOSIS — J9601 Acute respiratory failure with hypoxia: Secondary | ICD-10-CM | POA: Diagnosis present

## 2015-10-07 DIAGNOSIS — N179 Acute kidney failure, unspecified: Secondary | ICD-10-CM | POA: Diagnosis not present

## 2015-10-07 DIAGNOSIS — R739 Hyperglycemia, unspecified: Secondary | ICD-10-CM

## 2015-10-07 DIAGNOSIS — E1165 Type 2 diabetes mellitus with hyperglycemia: Secondary | ICD-10-CM | POA: Diagnosis not present

## 2015-10-07 DIAGNOSIS — R0602 Shortness of breath: Secondary | ICD-10-CM

## 2015-10-07 DIAGNOSIS — Z7982 Long term (current) use of aspirin: Secondary | ICD-10-CM

## 2015-10-07 DIAGNOSIS — Y95 Nosocomial condition: Secondary | ICD-10-CM | POA: Diagnosis present

## 2015-10-07 DIAGNOSIS — G4730 Sleep apnea, unspecified: Secondary | ICD-10-CM | POA: Diagnosis present

## 2015-10-07 DIAGNOSIS — I69392 Facial weakness following cerebral infarction: Secondary | ICD-10-CM

## 2015-10-07 DIAGNOSIS — Z7984 Long term (current) use of oral hypoglycemic drugs: Secondary | ICD-10-CM

## 2015-10-07 DIAGNOSIS — R069 Unspecified abnormalities of breathing: Secondary | ICD-10-CM | POA: Diagnosis not present

## 2015-10-07 DIAGNOSIS — F419 Anxiety disorder, unspecified: Secondary | ICD-10-CM | POA: Diagnosis present

## 2015-10-07 DIAGNOSIS — Z886 Allergy status to analgesic agent status: Secondary | ICD-10-CM | POA: Diagnosis not present

## 2015-10-07 DIAGNOSIS — J189 Pneumonia, unspecified organism: Secondary | ICD-10-CM | POA: Diagnosis present

## 2015-10-07 DIAGNOSIS — J9600 Acute respiratory failure, unspecified whether with hypoxia or hypercapnia: Secondary | ICD-10-CM | POA: Diagnosis not present

## 2015-10-07 DIAGNOSIS — M8580 Other specified disorders of bone density and structure, unspecified site: Secondary | ICD-10-CM | POA: Diagnosis present

## 2015-10-07 DIAGNOSIS — R05 Cough: Secondary | ICD-10-CM | POA: Diagnosis not present

## 2015-10-07 DIAGNOSIS — R509 Fever, unspecified: Secondary | ICD-10-CM | POA: Diagnosis not present

## 2015-10-07 DIAGNOSIS — E11319 Type 2 diabetes mellitus with unspecified diabetic retinopathy without macular edema: Secondary | ICD-10-CM

## 2015-10-07 LAB — BASIC METABOLIC PANEL
Anion gap: 13 (ref 5–15)
BUN: 22 mg/dL — ABNORMAL HIGH (ref 6–20)
CO2: 19 mmol/L — ABNORMAL LOW (ref 22–32)
Calcium: 8.3 mg/dL — ABNORMAL LOW (ref 8.9–10.3)
Chloride: 105 mmol/L (ref 101–111)
Creatinine, Ser: 1.82 mg/dL — ABNORMAL HIGH (ref 0.44–1.00)
GFR calc Af Amer: 28 mL/min — ABNORMAL LOW (ref >60)
GFR calc non Af Amer: 24 mL/min — ABNORMAL LOW (ref >60)
Glucose, Bld: 512 mg/dL — ABNORMAL HIGH (ref 65–99)
Potassium: 3.5 mmol/L (ref 3.5–5.1)
Sodium: 137 mmol/L (ref 135–145)

## 2015-10-07 LAB — CBC WITH DIFFERENTIAL/PLATELET
Basophils Absolute: 0 10*3/uL (ref 0.0–0.1)
Basophils Relative: 0 %
Eosinophils Absolute: 0 10*3/uL (ref 0.0–0.7)
Eosinophils Relative: 0 %
HCT: 32.6 % — ABNORMAL LOW (ref 36.0–46.0)
Hemoglobin: 10.2 g/dL — ABNORMAL LOW (ref 12.0–15.0)
Lymphocytes Relative: 12 %
Lymphs Abs: 1.3 10*3/uL (ref 0.7–4.0)
MCH: 27.1 pg (ref 26.0–34.0)
MCHC: 31.3 g/dL (ref 30.0–36.0)
MCV: 86.7 fL (ref 78.0–100.0)
Monocytes Absolute: 0.2 10*3/uL (ref 0.1–1.0)
Monocytes Relative: 2 %
Neutro Abs: 9.6 10*3/uL — ABNORMAL HIGH (ref 1.7–7.7)
Neutrophils Relative %: 86 %
Platelets: 185 10*3/uL (ref 150–400)
RBC: 3.76 MIL/uL — ABNORMAL LOW (ref 3.87–5.11)
RDW: 13.9 % (ref 11.5–15.5)
WBC: 11.1 10*3/uL — ABNORMAL HIGH (ref 4.0–10.5)

## 2015-10-07 LAB — INFLUENZA PANEL BY PCR (TYPE A & B)
H1N1 flu by pcr: NOT DETECTED
Influenza A By PCR: NEGATIVE
Influenza B By PCR: NEGATIVE

## 2015-10-07 LAB — CBG MONITORING, ED
Glucose-Capillary: 416 mg/dL — ABNORMAL HIGH (ref 65–99)
Glucose-Capillary: 406 mg/dL — ABNORMAL HIGH (ref 65–99)

## 2015-10-07 LAB — BRAIN NATRIURETIC PEPTIDE: B Natriuretic Peptide: 186.5 pg/mL — ABNORMAL HIGH (ref 0.0–100.0)

## 2015-10-07 LAB — PROCALCITONIN: Procalcitonin: 0.65 ng/mL

## 2015-10-07 LAB — GLUCOSE, CAPILLARY: Glucose-Capillary: 443 mg/dL — ABNORMAL HIGH (ref 65–99)

## 2015-10-07 MED ORDER — ALBUTEROL SULFATE (2.5 MG/3ML) 0.083% IN NEBU: 2.5000 mg | INHALATION_SOLUTION | RESPIRATORY_TRACT | Status: DC | PRN

## 2015-10-07 MED ORDER — LEVOFLOXACIN IN D5W 500 MG/100ML IV SOLN
500.0000 mg | Freq: Once | INTRAVENOUS | Status: AC
  Administered 2015-10-07: 500 mg via INTRAVENOUS
  Filled 2015-10-07: qty 100

## 2015-10-07 MED ORDER — OSELTAMIVIR PHOSPHATE 75 MG PO CAPS: 75.0000 mg | ORAL_CAPSULE | Freq: Two times a day (BID) | ORAL | Status: DC

## 2015-10-07 MED ORDER — INSULIN ASPART 100 UNIT/ML ~~LOC~~ SOLN
3.0000 [IU] | Freq: Three times a day (TID) | SUBCUTANEOUS | Status: DC
  Administered 2015-10-08 (×3): 3 [IU] via SUBCUTANEOUS

## 2015-10-07 MED ORDER — GEMFIBROZIL 600 MG PO TABS
600.0000 mg | ORAL_TABLET | Freq: Every day | ORAL | Status: DC
  Administered 2015-10-08 – 2015-10-12 (×5): 600 mg via ORAL
  Filled 2015-10-07 (×5): qty 1

## 2015-10-07 MED ORDER — OSELTAMIVIR PHOSPHATE 30 MG PO CAPS
30.0000 mg | ORAL_CAPSULE | Freq: Every day | ORAL | Status: DC
  Administered 2015-10-07: 30 mg via ORAL
  Filled 2015-10-07 (×2): qty 1

## 2015-10-07 MED ORDER — IPRATROPIUM-ALBUTEROL 0.5-2.5 (3) MG/3ML IN SOLN
3.0000 mL | Freq: Once | RESPIRATORY_TRACT | Status: AC
  Administered 2015-10-07: 3 mL via RESPIRATORY_TRACT
  Filled 2015-10-07: qty 3

## 2015-10-07 MED ORDER — VANCOMYCIN HCL 10 G IV SOLR
1500.0000 mg | Freq: Once | INTRAVENOUS | Status: AC
  Administered 2015-10-07: 1500 mg via INTRAVENOUS
  Filled 2015-10-07: qty 1500

## 2015-10-07 MED ORDER — ADULT MULTIVITAMIN W/MINERALS CH
1.0000 | ORAL_TABLET | Freq: Every day | ORAL | Status: DC
  Administered 2015-10-08 – 2015-10-12 (×5): 1 via ORAL
  Filled 2015-10-07 (×5): qty 1

## 2015-10-07 MED ORDER — METOPROLOL SUCCINATE ER 25 MG PO TB24
12.5000 mg | ORAL_TABLET | Freq: Two times a day (BID) | ORAL | Status: DC
  Administered 2015-10-07 – 2015-10-12 (×10): 12.5 mg via ORAL
  Filled 2015-10-07 (×11): qty 1

## 2015-10-07 MED ORDER — METHYLPREDNISOLONE SODIUM SUCC 125 MG IJ SOLR
125.0000 mg | Freq: Once | INTRAMUSCULAR | Status: AC
  Administered 2015-10-07: 125 mg via INTRAVENOUS
  Filled 2015-10-07: qty 2

## 2015-10-07 MED ORDER — INSULIN ASPART 100 UNIT/ML ~~LOC~~ SOLN
10.0000 [IU] | Freq: Once | SUBCUTANEOUS | Status: AC
  Administered 2015-10-07: 10 [IU] via INTRAVENOUS
  Filled 2015-10-07: qty 1

## 2015-10-07 MED ORDER — BENZONATATE 100 MG PO CAPS
100.0000 mg | ORAL_CAPSULE | Freq: Three times a day (TID) | ORAL | Status: DC
  Administered 2015-10-07 – 2015-10-12 (×14): 100 mg via ORAL
  Filled 2015-10-07 (×16): qty 1

## 2015-10-07 MED ORDER — CALCIUM CARBONATE-VITAMIN D 500-200 MG-UNIT PO TABS
1.0000 | ORAL_TABLET | Freq: Every day | ORAL | Status: DC
  Administered 2015-10-08 – 2015-10-12 (×5): 1 via ORAL
  Filled 2015-10-07 (×5): qty 1

## 2015-10-07 MED ORDER — IPRATROPIUM-ALBUTEROL 0.5-2.5 (3) MG/3ML IN SOLN
3.0000 mL | RESPIRATORY_TRACT | Status: DC
  Administered 2015-10-07 – 2015-10-09 (×11): 3 mL via RESPIRATORY_TRACT
  Filled 2015-10-07 (×10): qty 3

## 2015-10-07 MED ORDER — FERROUS SULFATE 325 (65 FE) MG PO TABS
325.0000 mg | ORAL_TABLET | Freq: Two times a day (BID) | ORAL | Status: DC
  Administered 2015-10-07 – 2015-10-12 (×10): 325 mg via ORAL
  Filled 2015-10-07 (×10): qty 1

## 2015-10-07 MED ORDER — AMLODIPINE BESYLATE 10 MG PO TABS: 10.0000 mg | ORAL_TABLET | Freq: Every day | ORAL | Status: DC

## 2015-10-07 MED ORDER — DEXTROSE 5 % IV SOLN
1.0000 g | Freq: Three times a day (TID) | INTRAVENOUS | Status: DC
  Filled 2015-10-07 (×2): qty 1

## 2015-10-07 MED ORDER — VANCOMYCIN HCL IN DEXTROSE 750-5 MG/150ML-% IV SOLN
750.0000 mg | INTRAVENOUS | Status: DC
  Administered 2015-10-08 – 2015-10-10 (×3): 750 mg via INTRAVENOUS
  Filled 2015-10-07 (×4): qty 150

## 2015-10-07 MED ORDER — MULTIVITAMINS PO CAPS: 1.0000 | ORAL_CAPSULE | Freq: Every day | ORAL | Status: DC

## 2015-10-07 MED ORDER — INSULIN ASPART 100 UNIT/ML ~~LOC~~ SOLN
8.0000 [IU] | Freq: Once | SUBCUTANEOUS | Status: AC
  Administered 2015-10-07: 8 [IU] via SUBCUTANEOUS

## 2015-10-07 MED ORDER — SODIUM CHLORIDE 0.9 % IV SOLN
INTRAVENOUS | Status: DC
  Administered 2015-10-07: 21:00:00 via INTRAVENOUS

## 2015-10-07 MED ORDER — INSULIN ASPART 100 UNIT/ML ~~LOC~~ SOLN
0.0000 [IU] | Freq: Every day | SUBCUTANEOUS | Status: DC
  Administered 2015-10-08: 2 [IU] via SUBCUTANEOUS
  Administered 2015-10-09: 3 [IU] via SUBCUTANEOUS

## 2015-10-07 MED ORDER — HEPARIN SODIUM (PORCINE) 5000 UNIT/ML IJ SOLN
5000.0000 [IU] | Freq: Three times a day (TID) | INTRAMUSCULAR | Status: DC
  Administered 2015-10-07 – 2015-10-12 (×13): 5000 [IU] via SUBCUTANEOUS
  Filled 2015-10-07 (×12): qty 1

## 2015-10-07 MED ORDER — SODIUM CHLORIDE 0.9 % IV BOLUS (SEPSIS)
1000.0000 mL | Freq: Once | INTRAVENOUS | Status: AC
  Administered 2015-10-07: 1000 mL via INTRAVENOUS

## 2015-10-07 MED ORDER — METHYLPREDNISOLONE SODIUM SUCC 125 MG IJ SOLR
60.0000 mg | Freq: Two times a day (BID) | INTRAMUSCULAR | Status: DC
  Administered 2015-10-07 – 2015-10-11 (×8): 60 mg via INTRAVENOUS
  Filled 2015-10-07 (×8): qty 2

## 2015-10-07 MED ORDER — BUDESONIDE 0.25 MG/2ML IN SUSP
0.2500 mg | Freq: Two times a day (BID) | RESPIRATORY_TRACT | Status: DC
  Administered 2015-10-08 – 2015-10-12 (×9): 0.25 mg via RESPIRATORY_TRACT
  Filled 2015-10-07 (×10): qty 2

## 2015-10-07 MED ORDER — ALBUTEROL SULFATE (2.5 MG/3ML) 0.083% IN NEBU
5.0000 mg | INHALATION_SOLUTION | Freq: Once | RESPIRATORY_TRACT | Status: AC
  Administered 2015-10-07: 5 mg via RESPIRATORY_TRACT
  Filled 2015-10-07: qty 6

## 2015-10-07 MED ORDER — VITAMIN D 1000 UNITS PO TABS
1000.0000 [IU] | ORAL_TABLET | Freq: Every day | ORAL | Status: DC
  Administered 2015-10-08 – 2015-10-12 (×5): 1000 [IU] via ORAL
  Filled 2015-10-07 (×5): qty 1

## 2015-10-07 MED ORDER — GUAIFENESIN ER 600 MG PO TB12
1200.0000 mg | ORAL_TABLET | Freq: Two times a day (BID) | ORAL | Status: DC
  Administered 2015-10-07 – 2015-10-12 (×10): 1200 mg via ORAL
  Filled 2015-10-07 (×10): qty 2

## 2015-10-07 MED ORDER — ALPRAZOLAM 0.5 MG PO TABS
0.5000 mg | ORAL_TABLET | Freq: Two times a day (BID) | ORAL | Status: DC | PRN
  Administered 2015-10-08 – 2015-10-09 (×3): 0.5 mg via ORAL
  Filled 2015-10-07 (×3): qty 1

## 2015-10-07 MED ORDER — ALBUTEROL SULFATE HFA 108 (90 BASE) MCG/ACT IN AERS: 2.0000 | INHALATION_SPRAY | Freq: Four times a day (QID) | RESPIRATORY_TRACT | Status: DC | PRN

## 2015-10-07 MED ORDER — SERTRALINE HCL 50 MG PO TABS
50.0000 mg | ORAL_TABLET | Freq: Every day | ORAL | Status: DC
  Administered 2015-10-08 – 2015-10-12 (×5): 50 mg via ORAL
  Filled 2015-10-07 (×5): qty 1

## 2015-10-07 MED ORDER — ASPIRIN 325 MG PO TABS
325.0000 mg | ORAL_TABLET | Freq: Every day | ORAL | Status: DC
  Administered 2015-10-08 – 2015-10-12 (×5): 325 mg via ORAL
  Filled 2015-10-07 (×5): qty 1

## 2015-10-07 MED ORDER — INSULIN GLARGINE 100 UNIT/ML ~~LOC~~ SOLN
10.0000 [IU] | Freq: Every day | SUBCUTANEOUS | Status: DC
Start: 2015-10-07 — End: 2015-10-08
  Administered 2015-10-07: 10 [IU] via SUBCUTANEOUS
  Filled 2015-10-07: qty 0.1

## 2015-10-07 MED ORDER — ALBUTEROL SULFATE (2.5 MG/3ML) 0.083% IN NEBU: 2.5000 mg | INHALATION_SOLUTION | RESPIRATORY_TRACT | Status: AC | PRN

## 2015-10-07 MED ORDER — INSULIN ASPART 100 UNIT/ML ~~LOC~~ SOLN
0.0000 [IU] | Freq: Three times a day (TID) | SUBCUTANEOUS | Status: DC
  Administered 2015-10-07: 20 [IU] via SUBCUTANEOUS
  Administered 2015-10-08: 11 [IU] via SUBCUTANEOUS
  Administered 2015-10-08: 15 [IU] via SUBCUTANEOUS
  Administered 2015-10-08: 11 [IU] via SUBCUTANEOUS
  Administered 2015-10-09: 4 [IU] via SUBCUTANEOUS
  Administered 2015-10-09 (×2): 7 [IU] via SUBCUTANEOUS
  Administered 2015-10-10: 3 [IU] via SUBCUTANEOUS
  Administered 2015-10-10: 20 [IU] via SUBCUTANEOUS
  Administered 2015-10-10 – 2015-10-11 (×2): 7 [IU] via SUBCUTANEOUS
  Administered 2015-10-11: 3 [IU] via SUBCUTANEOUS
  Administered 2015-10-11: 4 [IU] via SUBCUTANEOUS
  Filled 2015-10-07: qty 1

## 2015-10-07 MED ORDER — DEXTROSE 5 % IV SOLN
1.0000 g | INTRAVENOUS | Status: DC
  Administered 2015-10-08 – 2015-10-11 (×4): 1 g via INTRAVENOUS
  Filled 2015-10-07 (×6): qty 1

## 2015-10-07 MED ORDER — SODIUM CHLORIDE 0.9 % IV SOLN: INTRAVENOUS | Status: DC

## 2015-10-07 MED ORDER — MIRTAZAPINE 7.5 MG PO TABS
7.5000 mg | ORAL_TABLET | Freq: Every day | ORAL | Status: DC
Start: 2015-10-07 — End: 2015-10-12
  Administered 2015-10-07 – 2015-10-11 (×5): 7.5 mg via ORAL
  Filled 2015-10-07 (×5): qty 1

## 2015-10-07 NOTE — ED Provider Notes (Signed)
CSN: [id number]     Arrival date & time 10/07/15  1416 History   First MD Initiated Contact with Patient 10/07/15 1503     Chief Complaint  Patient presents with  . Shortness of Breath     (Consider location/radiation/quality/duration/timing/severity/associated sxs/prior Treatment) HPI   82 year old female with cough and shortness of breath. Symptom onset on Saturday. Symptoms have been persistent and progressive since then. Seen by PCP on 2/14 and prescribed azithromycin. Denies past history of COPD, asthma or HF. Denies acute swelling.  Describes some chest "soreness" when coughing. Denies any chest pain otherwise. Has felt chills.  No recorded temperature. Nausea. No sick contacts.  Past Medical History  Diagnosis Date  . Sleep apnea   . Diabetes mellitus     type II  . Hypertension   . Hyperlipidemia   . Stroke [facility]) 05/2010    Small vessel sobcortical (in [place] trial) with Dr Leonie Man, residual L hemiparesis  . Osteoarthritis   . Angioedema     possibly from voltaren  . Vitamin B 12 deficiency 04/08  . LVH (left ventricular hypertrophy)     and atrial enlargement by echo in past with nl EF  . Degenerative disc disease   . Osteopenia   . Nasal pruritis   . Renal insufficiency    Past Surgical History  Procedure Laterality Date  . Eye surgery      cataract extraction  . Abdominal hysterectomy      BSO-fibroids  . Knee surgery      arthroscope  . Spine surgery  08/09    spinal decompression surgery  . Retinal detachment surgery  02/18/11    times 2  . Back surgery    . Colon surgery      due to punctured intestines  . Appendectomy    . Pars plana vitrectomy  07/31/2011    Procedure: PARS PLANA VITRECTOMY WITH 25 GAUGE;  Surgeon: [name], MD;  Location: [place];  Service: Ophthalmology;  Laterality: Right;  REMOVAL OF SILICONE OIL AND LASER RIGHT EYE   Family History  Problem Relation Age of Onset  . Cancer Sister     brain tumor with hemmorhage  . Heart  disease Sister     CAD  . COPD Brother    Social History  Substance Use Topics  . Smoking status: Never Smoker   . Smokeless tobacco: Never Used  . Alcohol Use: No   OB History    No data available     Review of Systems  All systems reviewed and negative, other than as noted in HPI.   Allergies  Diclofenac sodium and Pioglitazone  Home Medications   Prior to Admission medications   Medication Sig Start Date End Date Taking? Authorizing Provider  acetaminophen (TYLENOL) 500 MG tablet Take 500 mg by mouth 2 (two) times daily.    Historical Provider, MD  albuterol (PROVENTIL HFA;VENTOLIN HFA) 108 (90 Base) MCG/ACT inhaler Inhale 2 puffs into the lungs every 6 (six) hours as needed for wheezing or shortness of breath. 10/07/15   Pleas Koch, NP  ALPRAZolam [name]) 0.5 MG tablet TAKE 1 TABLET BY MOUTH TWICE A DAY AS NEEDED FOR ANXIETY (SEDATION WARNING) 08/01/15   Abner Greenspan, MD  amLODipine (NORVASC) 10 MG tablet Take 1 tablet (10 mg total) by mouth daily. 11/09/14   Abner Greenspan, MD  aspirin 325 MG tablet Take 325 mg by mouth daily.     Historical Provider, MD  azithromycin [facility])  250 MG tablet Take 1 tablet by mouth once. 10/06/15   Pleas Koch, NP  benzonatate (TESSALON) 200 MG capsule Take 1 capsule (200 mg total) by mouth 3 (three) times daily as needed. 10/06/15   Pleas Koch, NP  calcium-vitamin D (OSCAL WITH D) 500-200 MG-UNIT per tablet Take 1 tablet by mouth daily.     Historical Provider, MD  Cholecalciferol (VITAMIN D) 1000 UNITS capsule Take 1,000 Units by mouth daily.     Historical Provider, MD  cyanocobalamin (,VITAMIN B-12,) 1000 MCG/ML injection Inject 1,000 mcg into the muscle every 30 (thirty) days.     Historical Provider, MD  ferrous sulfate 325 (65 FE) MG tablet Take 325 mg by mouth 2 (two) times daily.    Historical Provider, MD  gemfibrozil (LOPID) 600 MG tablet TAKE 1 TABLET EVERY DAY 10/29/14   Abner Greenspan, MD  glipiZIDE (GLUCOTROL  XL) 2.5 MG 24 hr tablet Take 1 tablet (2.5 mg total) by mouth daily with breakfast. 02/23/15   Abner Greenspan, MD  hydrALAZINE (APRESOLINE) 100 MG tablet  08/30/14   Historical Provider, MD  metoprolol succinate (TOPROL-XL) 25 MG 24 hr tablet Take 0.5 tablets (12.5 mg total) by mouth 2 (two) times daily. 11/09/14   Abner Greenspan, MD  mirtazapine (REMERON) 15 MG tablet TAKE 1/2 TABLET AT BEDTIME 04/13/15   Abner Greenspan, MD  Multiple Vitamin (MULTIVITAMIN) capsule Take 1 capsule by mouth daily.     Historical Provider, MD  sertraline (ZOLOFT) 50 MG tablet Take 1 tablet (50 mg total) by mouth daily. 11/09/14   Abner Greenspan, MD  trandolapril (MAVIK) 4 MG tablet TAKE 1 TABLET EVERY DAY 10/04/15   Marne A Tower, MD   BP 135/50 mmHg  Pulse 82  Temp(Src) 98.4 F (36.9 C) (Oral)  Resp 32  SpO2 98% Physical Exam  Constitutional: She appears well-developed and well-nourished. No distress.  HENT:  Head: Normocephalic and atraumatic.  Eyes: Conjunctivae are normal. Right eye exhibits no discharge. Left eye exhibits no discharge.  Neck: Neck supple.  Cardiovascular: Normal rate, regular rhythm and normal heart sounds.  Exam reveals no gallop and no friction rub.   No murmur heard. Pulmonary/Chest: No respiratory distress. She has wheezes.  Tachypnea. Bilateral rhonchi, worse in the upper lobes. Faint expiratory wheezing more noticeable on right.  Abdominal: Soft. She exhibits no distension. There is no tenderness.  Musculoskeletal: She exhibits no edema or tenderness.  Neurological: She is alert.  Skin: Skin is warm and dry.  Psychiatric: She has a normal mood and affect. Her behavior is normal. Thought content normal.  Nursing note and vitals reviewed.   ED Course  Procedures (including critical care time) Labs Review Labs Reviewed  BRAIN NATRIURETIC PEPTIDE - Abnormal; Notable for the following:    B Natriuretic Peptide 186.5 (*)    All other components within normal limits  CBC WITH  DIFFERENTIAL/PLATELET - Abnormal; Notable for the following:    WBC 11.1 (*)    RBC 3.76 (*)    Hemoglobin 10.2 (*)    HCT 32.6 (*)    Neutro Abs 9.6 (*)    All other components within normal limits  BASIC METABOLIC PANEL - Abnormal; Notable for the following:    CO2 19 (*)    Glucose, Bld 512 (*)    BUN 22 (*)    Creatinine, Ser 1.82 (*)    Calcium 8.3 (*)    GFR calc non Af Amer 24 (*)  GFR calc Af Amer 28 (*)    All other components within normal limits  CBG MONITORING, ED - Abnormal; Notable for the following:    Glucose-Capillary 416 (*)    All other components within normal limits    Imaging Review Dg Chest 2 View  10/07/2015  CLINICAL DATA:  Cough, congestion, chills x 5 days. Increased resp effort. ?fever. N/v. Non smoker. Hx stroke. ?hx pna. No chest pain. EXAM: CHEST  2 VIEW COMPARISON:  09/04/2012 FINDINGS: There is patchy airspace opacity in the central right upper lobe. There is mild lung base opacity accentuated by low lung volumes, likely atelectasis. No pulmonary edema convincing pleural effusion pneumothorax. Cardiac silhouette is mildly enlarged. Mediastinal hilar masses or evidence adenopathy. Bony thorax is demineralized but grossly intact. IMPRESSION: 1. Right upper lobe pneumonia. 2. Lung base opacity could reflect additional pneumonia but is more likely atelectasis. No pulmonary edema. 3. Mild cardiomegaly. Electronically Signed   By: Lajean Manes M.D.   On: 10/07/2015 16:12   I have personally reviewed and evaluated these images and lab results as part of my medical decision-making.   EKG Interpretation   Date/Time:  Friday October 07 2015 14:12:17 EST Ventricular Rate:  80 PR Interval:  136 QRS Duration: 82 QT Interval:  416 QTC Calculation: 479 R Axis:   -86 Text Interpretation:  Normal sinus rhythm Left anterior fascicular block  Poor R wave progression Abnormal ECG Confirmed by Wilson Singer  MD, Combs  ([id number]) on 10/07/2015 3:19:00 PM      MDM    Final diagnoses:  CAP (community acquired pneumonia)  Hyperglycemia  AKI (acute kidney injury) ([place])    86yF with dyspnea and cough. Worsening over last week. Primarily rhonchi, but some wheezing also noted. Reports some improvement with nebs. Clinically does not appear volume overloaded. Glucose in 400s. Denies recent steroid use. Steroids in ED which will only increase. Some insulin given.   CXR consistent with pneumonia. No improvement after 4 days of azithromycin. Is tachypneic. Needs admitted. Hyperglycemia with mild metabolic acidosis. Anion gap is not elevated. Worsening renal function as well. Will give some IVF.  Discuss with medicine for admission.     Virgel Manifold, MD 10/11/15 [phone]

## 2015-10-07 NOTE — Telephone Encounter (Signed)
Noted. Albuterol inhaler sent to pharmacy.

## 2015-10-07 NOTE — ED Notes (Signed)
Pt placed in A5. Gown placed on pt and pt put on cardiac monitor. Family at bedside.

## 2015-10-07 NOTE — Progress Notes (Signed)
Pharmacy Antibiotic Note  Renee [name] is a 82 y.o. female admitted on 10/07/2015 with pneumonia. Starting vancomycin / cefepime. SCr 1.82, eCrCl 20-25 ml/min.     Plan: Vancomycin 1500 mg IV x1 the 750/24h Adjust Cefepime to 1 g IV q24h Monitor renal fx, cultures, duration of therapy     Temp (24hrs), Avg:98.4 F (36.9 C), Min:98.4 F (36.9 C), Max:98.4 F (36.9 C)   Recent Labs Lab 10/07/15 1545  WBC 11.1*  CREATININE 1.82*    Estimated Creatinine Clearance: 22.6 mL/min (by C-G formula based on Cr of 1.82).    Allergies  Allergen Reactions  . Diclofenac Sodium Other (See Comments)    REACTION: angioedema  . Pioglitazone Swelling    Antimicrobials this admission: 2/17 cefepime > 2/17 vancomycin >    Thank you for allowing pharmacy to be a part of this patient's care.  Harvel Quale 10/07/2015 8:40 PM

## 2015-10-07 NOTE — Telephone Encounter (Signed)
Called and notified patient of Kate's comments. Patient verbalized understanding. Patient stated that she would like to have albuterol inhaler.

## 2015-10-07 NOTE — ED Notes (Signed)
Pt. Coming from home via [facility] with increased SOB. Pt. Seen at PCP Monday for URI and given Zpack. Pt. sts she still isn't feeling better. Upon arrival pt. Rhonchi in all fields and wheezing. Pt. Given 10 mg albuterol tx en route. Pt. Wheezing subsided, but rhonchi still audible. Pt. CBG 456 en route and NS started. EMS reports around 100 mL fluid given en route. Pt. O2 saturation 98% on neb treatment. Pt. Placed on 2L oxygen via  at this time.

## 2015-10-07 NOTE — ED Notes (Signed)
CBG 406. ADmitting paged at this time to receive glycemic control orders.

## 2015-10-07 NOTE — H&P (Signed)
History and Physical        Hospital Admission Note Date: 10/07/2015  Patient name: Renee [name] Medical record number: [id number] Date of birth: 10-17-1928 Age: 82 y.o. Gender: female  PCP: [name], MD  Referring physician: Dr Wilson Singer   Chief Complaint:  Shortness of breath with difficulty breathing  HPI: Patient is a 82 year old female with diabetes mellitus, CVA, hypertension, chronic kidney disease stage III presented to ED with worsening shortness of breath and difficulty breathing. Patient reported that her symptoms started last week on Saturday, her sister had been sick with similar symptoms. Patient saw her PCP on 2/14 and was prescribed Z-Pak, Tessalon Perles and albuterol inhaler. Patient reports however her symptoms continued to get worse, she had coughing with yellowish greenish phlegm and pleuritic chest pain with coughing. She also reported generalized weakness with nasal congestion, body aches, subjective fever and chills and fatigue. Today she had nausea, vomiting and 1 episode of diarrhea.  Patient had difficulty breathing and EMS was called, patient was noted to have rhonchi in all fields with wheezing and patient was given 10 mg albuterol treatment en route. CBG was noted to be 456, patient was placed on 2 L O2.  At the time of my examination, patient had audible wheezing with coarse breath sounds in all fields and difficulty completing full sentences.  ED course: BMET showed bicarbonate of 19, creatinine 1.8, baseline 1.3-1.4, glucose 512, anion gap 13. CBC showed WBC count of 11.1, hemoglobin 10.2, hematocrit 32.6, BNP 186.5 Chest x-ray showed right upper lobe pneumonia, lung base opacity could reflect additional pneumonia, no pulmonary edema.  Review of Systems:  Constitutional: + fever, chills, diaphoresis,+ poor appetite and fatigue.  HEENT: Denies  photophobia, eye pain, redness, hearing loss, ear pain,+  congestion, sore throat, rhinorrhea, sneezing, mouth sores, trouble swallowing, neck pain, neck stiffness and tinnitus.   Respiratory: Please see history of present illness Cardiovascular: Denies chest pain, palpitations and leg swelling.  Gastrointestinal: + nausea, vomiting, abdominal pain, +diarrhea,  blood in stool and abdominal distention.  Genitourinary: Denies dysuria, urgency, frequency, hematuria, flank pain and difficulty urinating.  Musculoskeletal: +myalgias, denies back pain, joint swelling, arthralgias and gait problem.  Skin: Denies pallor, rash and wound.  Neurological: Denies dizziness, seizures, syncope, light-headedness, numbness and headaches. + generalized weakness  Hematological: Denies adenopathy. Easy bruising, personal or family bleeding history  Psychiatric/Behavioral: Denies suicidal ideation, mood changes, confusion, nervousness, sleep disturbance and agitation  Past Medical History: Past Medical History  Diagnosis Date  . Sleep apnea   . Diabetes mellitus     type II  . Hypertension   . Hyperlipidemia   . Stroke [facility]) 05/2010    Small vessel sobcortical (in [place] trial) with Dr Leonie Man, residual L hemiparesis  . Osteoarthritis   . Angioedema     possibly from voltaren  . Vitamin B 12 deficiency 04/08  . LVH (left ventricular hypertrophy)     and atrial enlargement by echo in past with nl EF  . Degenerative disc disease   . Osteopenia   . Nasal pruritis   . Renal insufficiency     Past Surgical History  Procedure Laterality Date  . Eye  surgery      cataract extraction  . Abdominal hysterectomy      BSO-fibroids  . Knee surgery      arthroscope  . Spine surgery  08/09    spinal decompression surgery  . Retinal detachment surgery  02/18/11    times 2  . Back surgery    . Colon surgery      due to punctured intestines  . Appendectomy    . Pars plana vitrectomy  07/31/2011    Procedure:  PARS PLANA VITRECTOMY WITH 25 GAUGE;  Surgeon: [name], MD;  Location: [place];  Service: Ophthalmology;  Laterality: Right;  REMOVAL OF SILICONE OIL AND LASER RIGHT EYE    Medications: Prior to Admission medications   Medication Sig Start Date End Date Taking? Authorizing Provider  acetaminophen (TYLENOL) 500 MG tablet Take 500 mg by mouth 2 (two) times daily.    Historical Provider, MD  albuterol (PROVENTIL HFA;VENTOLIN HFA) 108 (90 Base) MCG/ACT inhaler Inhale 2 puffs into the lungs every 6 (six) hours as needed for wheezing or shortness of breath. 10/07/15   Pleas Koch, NP  ALPRAZolam [name]) 0.5 MG tablet TAKE 1 TABLET BY MOUTH TWICE A DAY AS NEEDED FOR ANXIETY (SEDATION WARNING) 08/01/15   Abner Greenspan, MD  amLODipine (NORVASC) 10 MG tablet Take 1 tablet (10 mg total) by mouth daily. 11/09/14   Abner Greenspan, MD  aspirin 325 MG tablet Take 325 mg by mouth daily.     Historical Provider, MD  azithromycin (ZITHROMAX) 250 MG tablet Take 1 tablet by mouth once. 10/06/15   Pleas Koch, NP  benzonatate (TESSALON) 200 MG capsule Take 1 capsule (200 mg total) by mouth 3 (three) times daily as needed. 10/06/15   Pleas Koch, NP  calcium-vitamin D (OSCAL WITH D) 500-200 MG-UNIT per tablet Take 1 tablet by mouth daily.     Historical Provider, MD  Cholecalciferol (VITAMIN D) 1000 UNITS capsule Take 1,000 Units by mouth daily.     Historical Provider, MD  cyanocobalamin (,VITAMIN B-12,) 1000 MCG/ML injection Inject 1,000 mcg into the muscle every 30 (thirty) days.     Historical Provider, MD  ferrous sulfate 325 (65 FE) MG tablet Take 325 mg by mouth 2 (two) times daily.    Historical Provider, MD  gemfibrozil (LOPID) 600 MG tablet TAKE 1 TABLET EVERY DAY 10/29/14   Abner Greenspan, MD  glipiZIDE (GLUCOTROL XL) 2.5 MG 24 hr tablet Take 1 tablet (2.5 mg total) by mouth daily with breakfast. 02/23/15   Abner Greenspan, MD  hydrALAZINE (APRESOLINE) 100 MG tablet  08/30/14   Historical  Provider, MD  metoprolol succinate (TOPROL-XL) 25 MG 24 hr tablet Take 0.5 tablets (12.5 mg total) by mouth 2 (two) times daily. 11/09/14   Abner Greenspan, MD  mirtazapine (REMERON) 15 MG tablet TAKE 1/2 TABLET AT BEDTIME 04/13/15   Abner Greenspan, MD  Multiple Vitamin (MULTIVITAMIN) capsule Take 1 capsule by mouth daily.     Historical Provider, MD  sertraline (ZOLOFT) 50 MG tablet Take 1 tablet (50 mg total) by mouth daily. 11/09/14   Abner Greenspan, MD  trandolapril (MAVIK) 4 MG tablet TAKE 1 TABLET EVERY DAY 10/04/15   Abner Greenspan, MD    Allergies:   Allergies  Allergen Reactions  . Diclofenac Sodium Other (See Comments)    REACTION: angioedema  . Pioglitazone Swelling    Social History:  reports that she has never smoked. She has  never used smokeless tobacco. She reports that she does not drink alcohol or use illicit drugs. patient lives at home.  Family History: Family History  Problem Relation Age of Onset  . Cancer Sister     brain tumor with hemmorhage  . Heart disease Sister     CAD  . COPD Brother     Physical Exam: Blood pressure 134/52, pulse 86, temperature 98.4 F (36.9 C), temperature source Oral, resp. rate 25, SpO2 95 %. General: Alert, awake, oriented x3, somewhat difficult to complete sentences, audible wheezing HEENT: normocephalic, atraumatic, anicteric sclera, pink conjunctiva, pupils equal and reactive to light and accomodation, oropharynx clear Neck: supple, no masses or lymphadenopathy, no goiter, no bruits  Heart: Regular rate and rhythm, without murmurs, rubs or gallops. Lungs: Diffuse rhonchi with wheezing, worse in upper lobes Abdomen: Soft, nontender, nondistended, positive bowel sounds, no masses. Extremities: No clubbing, cyanosis or edema with positive pedal pulses. Neuro: Grossly intact, no focal neurological deficits, strength 5/5 upper and lower extremities bilaterally Psych: alert and oriented x 3, normal mood and affect Skin: no rashes or  lesions, warm and dry   LABS on Admission:  Basic Metabolic Panel:  Recent Labs Lab 10/07/15 1545  NA 137  K 3.5  CL 105  CO2 19*  GLUCOSE 512*  BUN 22*  CREATININE 1.82*  CALCIUM 8.3*   Liver Function Tests: No results for input(s): AST, ALT, ALKPHOS, BILITOT, PROT, ALBUMIN in the last 168 hours. No results for input(s): LIPASE, AMYLASE in the last 168 hours. No results for input(s): AMMONIA in the last 168 hours. CBC:  Recent Labs Lab 10/07/15 1545  WBC 11.1*  NEUTROABS 9.6*  HGB 10.2*  HCT 32.6*  MCV 86.7  PLT 185   Cardiac Enzymes: No results for input(s): CKTOTAL, CKMB, CKMBINDEX, TROPONINI in the last 168 hours. BNP: Invalid input(s): POCBNP CBG:  Recent Labs Lab 10/07/15 1441  GLUCAP 416*    Radiological Exams on Admission:  Dg Chest 2 View  10/07/2015  CLINICAL DATA:  Cough, congestion, chills x 5 days. Increased resp effort. ?fever. N/v. Non smoker. Hx stroke. ?hx pna. No chest pain. EXAM: CHEST  2 VIEW COMPARISON:  09/04/2012 FINDINGS: There is patchy airspace opacity in the central right upper lobe. There is mild lung base opacity accentuated by low lung volumes, likely atelectasis. No pulmonary edema convincing pleural effusion pneumothorax. Cardiac silhouette is mildly enlarged. Mediastinal hilar masses or evidence adenopathy. Bony thorax is demineralized but grossly intact. IMPRESSION: 1. Right upper lobe pneumonia. 2. Lung base opacity could reflect additional pneumonia but is more likely atelectasis. No pulmonary edema. 3. Mild cardiomegaly. Electronically Signed   By: Lajean Manes M.D.   On: 10/07/2015 16:12    *I have personally reviewed the images above*  EKG: Independently reviewed. Rate 80, QTC 479, no acute ST-T wave changes suggestive of ischemia  Assessment/Plan Principal Problem:   Acute respiratory failure (HCC) with HCAP (healthcare-associated pneumonia): Rule out influenza -  Admit to stepdown unit, obtain pro-calcitonin, sputum  cultures blood cultures, urine legionella antigen, urine strep antigen - Placed on IV vancomycin, IV cefepime for HCAP.  - Also place on Tamiflu, if influenza panel is negative, will DC Tamiflu , droplet precautions - Placed on scheduled bronchodilators duonebs, Pulmicort, flutter valve, Mucinex, Tessalon Perles, IV Solu-Medrol - Swallow evaluation to rule out aspiration   Active Problems:   Diabetes type 2, uncontrolled ([place]) with hyperosmolar syndrome, anion gap 13 - will place on resistance sliding scale insulin (due to  steroid use), meal coverage and Lantus - If patient's blood sugars are consistently high, will place on insulin glucose stabilizer  - Obtain hemoglobin A1c    Essential hypertension - Continue Toprol-XL, amlodipine  Anxiety -Continue Xanax, Remeron, Zoloft  Hyperlipidemia - Continue Lopid   DVT prophylaxis: Heparin subcutaneous   CODE STATUS: Discussed in detail with the patient, she opted for full CODE STATUS   Family Communication: Admission, patients condition and plan of care including tests being ordered have been discussed with the patient and Granddaughter who indicates understanding and agree with the plan and Code Status  Disposition plan: Further plan will depend as patient's clinical course evolves and further radiologic and laboratory data become available. Likely home In 2-3 days when medically stable, will need PT evaluation   Time Spent on Admission: 60 minutes   Madysin Crisp M.D. [facility] 10/07/2015, 5:26 PM Pager: [phone]  If 7PM-7AM, please contact night-coverage [email or web address] Password [email or web address]

## 2015-10-08 LAB — CBC
HCT: 31.6 % — ABNORMAL LOW (ref 36.0–46.0)
Hemoglobin: 9.8 g/dL — ABNORMAL LOW (ref 12.0–15.0)
MCH: 26.8 pg (ref 26.0–34.0)
MCHC: 31 g/dL (ref 30.0–36.0)
MCV: 86.6 fL (ref 78.0–100.0)
Platelets: 186 10*3/uL (ref 150–400)
RBC: 3.65 MIL/uL — ABNORMAL LOW (ref 3.87–5.11)
RDW: 13.9 % (ref 11.5–15.5)
WBC: 7.9 10*3/uL (ref 4.0–10.5)

## 2015-10-08 LAB — BASIC METABOLIC PANEL
Anion gap: 10 (ref 5–15)
BUN: 27 mg/dL — ABNORMAL HIGH (ref 6–20)
CO2: 21 mmol/L — ABNORMAL LOW (ref 22–32)
Calcium: 8.6 mg/dL — ABNORMAL LOW (ref 8.9–10.3)
Chloride: 109 mmol/L (ref 101–111)
Creatinine, Ser: 1.66 mg/dL — ABNORMAL HIGH (ref 0.44–1.00)
GFR calc Af Amer: 32 mL/min — ABNORMAL LOW (ref >60)
GFR calc non Af Amer: 27 mL/min — ABNORMAL LOW (ref >60)
Glucose, Bld: 375 mg/dL — ABNORMAL HIGH (ref 65–99)
Potassium: 3.7 mmol/L (ref 3.5–5.1)
Sodium: 140 mmol/L (ref 135–145)

## 2015-10-08 LAB — GLUCOSE, CAPILLARY
Glucose-Capillary: 318 mg/dL — ABNORMAL HIGH (ref 65–99)
Glucose-Capillary: 274 mg/dL — ABNORMAL HIGH (ref 65–99)
Glucose-Capillary: 255 mg/dL — ABNORMAL HIGH (ref 65–99)
Glucose-Capillary: 204 mg/dL — ABNORMAL HIGH (ref 65–99)

## 2015-10-08 LAB — BRAIN NATRIURETIC PEPTIDE: B Natriuretic Peptide: 177.9 pg/mL — ABNORMAL HIGH (ref 0.0–100.0)

## 2015-10-08 LAB — HEMOGLOBIN A1C
Hgb A1c MFr Bld: 7.1 % — ABNORMAL HIGH (ref 4.8–5.6)
Mean Plasma Glucose: 157 mg/dL

## 2015-10-08 LAB — HIV ANTIBODY (ROUTINE TESTING W REFLEX): HIV Screen 4th Generation wRfx: NONREACTIVE

## 2015-10-08 LAB — MRSA PCR SCREENING: MRSA by PCR: NEGATIVE

## 2015-10-08 MED ORDER — INSULIN GLARGINE 100 UNIT/ML ~~LOC~~ SOLN
10.0000 [IU] | Freq: Two times a day (BID) | SUBCUTANEOUS | Status: DC
  Administered 2015-10-08 – 2015-10-10 (×5): 10 [IU] via SUBCUTANEOUS
  Filled 2015-10-08 (×8): qty 0.1

## 2015-10-08 MED ORDER — DOCUSATE SODIUM 100 MG PO CAPS
200.0000 mg | ORAL_CAPSULE | Freq: Two times a day (BID) | ORAL | Status: DC | PRN
  Administered 2015-10-08 – 2015-10-09 (×2): 200 mg via ORAL
  Filled 2015-10-08 (×2): qty 2

## 2015-10-08 NOTE — Evaluation (Addendum)
Clinical/Bedside Swallow Evaluation Patient Details  Name: Renee [name] MRN: [id number] Date of Birth: Feb 06, 1929  Today's Date: 10/08/2015 Time: SLP Start Time (ACUTE ONLY): 1438 SLP Stop Time (ACUTE ONLY): 1448 SLP Time Calculation (min) (ACUTE ONLY): 10 min  Past Medical History:  Past Medical History  Diagnosis Date  . Sleep apnea   . Diabetes mellitus     type II  . Hypertension   . Hyperlipidemia   . Stroke [facility]) 05/2010    Small vessel sobcortical (in [place] trial) with Dr Renee [name], residual L hemiparesis  . Osteoarthritis   . Angioedema     possibly from voltaren  . Vitamin B 12 deficiency 04/08  . LVH (left ventricular hypertrophy)     and atrial enlargement by echo in past with nl EF  . Degenerative disc disease   . Osteopenia   . Nasal pruritis   . Renal insufficiency    Past Surgical History:  Past Surgical History  Procedure Laterality Date  . Eye surgery      cataract extraction  . Abdominal hysterectomy      BSO-fibroids  . Knee surgery      arthroscope  . Spine surgery  08/09    spinal decompression surgery  . Retinal detachment surgery  02/18/11    times 2  . Back surgery    . Colon surgery      due to punctured intestines  . Appendectomy    . Pars plana vitrectomy  07/31/2011    Procedure: PARS PLANA VITRECTOMY WITH 25 GAUGE;  Surgeon: Renee Pedro, MD;  Location: [place];  Service: Ophthalmology;  Laterality: Right;  REMOVAL OF SILICONE OIL AND LASER RIGHT EYE   HPI:  Patient is a 82 year old female with diabetes mellitus, CVA, hypertension, chronic kidney disease stage III presented to ED with worsening shortness of breath and difficulty breathing for last 5 days. Patient saw her PCP on 2/14 and was prescribed Z-Pak, Tessalon Perles and albuterol inhaler. Patient reported that her symptoms continued to get worse, she had coughing with yellowish greenish phlegm and pleuritic chest pain with coughing with subjective fevers and chills. CXR showed right  upper lobe PNA. Swallow evaluation complete to r/o aspiration as cause.    Assessment / Plan / Recommendation Clinical Impression  Patient presents with a functional oropharyngeal swallow without evidence of aspiration. Reports no difficulty swallowing prior to admission or recurrent PNAs. No SLP f/u indicated at this time.     Aspiration Risk  Mild aspiration risk    Diet Recommendation Regular;Thin liquid   Liquid Administration via: Cup;Straw Medication Administration: Whole meds with liquid Supervision: Patient able to self feed Compensations: Slow rate;Small sips/bites    Other  Recommendations Oral Care Recommendations: Oral care BID   Follow up Recommendations  None      Swallow Study   General HPI: Patient is a 82 year old female with diabetes mellitus, CVA, hypertension, chronic kidney disease stage III presented to ED with worsening shortness of breath and difficulty breathing for last 5 days. Patient saw her PCP on 2/14 and was prescribed Z-Pak, Tessalon Perles and albuterol inhaler. Patient reported that her symptoms continued to get worse, she had coughing with yellowish greenish phlegm and pleuritic chest pain with coughing with subjective fevers and chills. CXR showed right upper lobe PNA. Diagnosed with HCAP. Swallow evaluation complete to r/o aspiration as cause.  Type of Study: Bedside Swallow Evaluation Previous Swallow Assessment: none Diet Prior to this Study: Regular;Thin liquids Temperature Spikes Noted: No  Respiratory Status: Room air History of Recent Intubation: No Behavior/Cognition: Alert;Cooperative;Pleasant mood Oral Cavity Assessment: Within Functional Limits Oral Care Completed by SLP: No Oral Cavity - Dentition: Adequate natural dentition Vision: Functional for self-feeding Self-Feeding Abilities: Able to feed self Patient Positioning: Upright in bed Baseline Vocal Quality: Normal Volitional Cough: Strong Volitional Swallow: Able to elicit     Oral/Motor/Sensory Function Overall Oral Motor/Sensory Function: Mild impairment Facial ROM:  (mild residual left sided weakness)   Ice Chips Ice chips: Not tested   Thin Liquid Thin Liquid: Within functional limits Presentation: Cup;Self Fed;Straw    Nectar Thick Nectar Thick Liquid: Not tested   Honey Thick Honey Thick Liquid: Not tested   Puree Puree: Not tested   Solid   GO   Solid: Within functional limits Presentation: Self Fed       Renee Wisser MA, CCC-SLP [phone]  Renee [name] 10/08/2015,2:59 PM

## 2015-10-08 NOTE — Progress Notes (Signed)
UR Completed. Arria Naim, RN, BSN.  [phone] 

## 2015-10-08 NOTE — Evaluation (Signed)
Physical Therapy Evaluation Patient Details Name: NEJRA WINTLE MRN: [id number] DOB: November 11, 1928 Today's Date: 10/08/2015   History of Present Illness  Patient is a 82 year old female with diabetes mellitus, CVA, hypertension, chronic kidney disease stage III presented to ED with worsening shortness of breath and difficulty breathing for last 5 days. Patient saw her PCP on 2/14 and was prescribed Z-Pak, Tessalon Perles and albuterol inhaler. Patient reported that her symptoms continued to get worse, she had coughing with yellowish greenish phlegm and pleuritic chest pain with coughing with subjective fevers and chills.ED course: BMET showed bicarbonate of 19, creatinine 1.8, baseline 1.3-1.4, glucose 512, anion gap 13. CBC showed WBC count of 11.1, hemoglobin 10.2, hematocrit 32.6, BNP 186.5  Clinical Impression  Pt admitted with above diagnosis. Pt currently with functional limitations due to the deficits listed below (see PT Problem List). Pt was able to ambulate with RW but was unsteady.  Lives alone with intermittent caregivers. Will need SNF to return to prior level of function.  Will follow acutely.   Pt will benefit from skilled PT to increase their independence and safety with mobility to allow discharge to the venue listed below.      Follow Up Recommendations SNF;Supervision/Assistance - 24 hour    Equipment Recommendations  None recommended by PT    Recommendations for Other Services       Precautions / Restrictions Precautions Precautions: Fall Restrictions Weight Bearing Restrictions: No      Mobility  Bed Mobility Overal bed mobility: Needs Assistance Bed Mobility: Supine to Sit     Supine to sit: Min assist     General bed mobility comments: Assist for LEs and elevation of trunk  Transfers Overall transfer level: Needs assistance Equipment used: Rolling walker (2 wheeled) Transfers: Sit to/from Stand Sit to Stand: Min assist;+2 safety/equipment          General transfer comment: Pt needed steadying asssist upon standing. Posterior lean at times  Ambulation/Gait Ambulation/Gait assistance: Min assist;+2 safety/equipment Ambulation Distance (Feet): 125 Feet Assistive device: Rolling walker (2 wheeled) Gait Pattern/deviations: Step-through pattern;Decreased stride length;Leaning posteriorly;Drifts right/left;Wide base of support;Trunk flexed   Gait velocity interpretation: Below normal speed for age/gender General Gait Details: Pt needed cues to stay close to rW and for sequencing steps and RW.  Pt unsteady at times needing min assist for support.  Did not show ability to self correct even with RW when challenged needing min assist for balanc.e   Stairs            Wheelchair Mobility    Modified Rankin (Stroke Patients Only)       Balance Overall balance assessment: Needs assistance;History of Falls Sitting-balance support: No upper extremity supported;Feet supported Sitting balance-Leahy Scale: Fair Sitting balance - Comments: Pt could sit EOB without ue support. Postural control: Posterior lean Standing balance support: Bilateral upper extremity supported;During functional activity Standing balance-Leahy Scale: Poor Standing balance comment: relieson UE support and could not stand statically without assist and RW.              High level balance activites: Direction changes;Turns;Sudden stops High Level Balance Comments: Needed min assist for stability in standing.              Pertinent Vitals/Pain Pain Assessment: No/denies pain  VSS with 98% O2 on 3LO2, 135/97.      Home Living Family/patient expects to be discharged to:: Private residence Living Arrangements: Non-relatives/Friends Available Help at Discharge: Personal care attendant;Available PRN/intermittently (Caregiver 930-330 M-F; Sat 930-330;  Lollie Sails) Type of Home: House Home Access: Ramped entrance     Home Layout: One level Home Equipment:  [address] - 2 wheels;Bedside commode;Toilet riser      Prior Function Level of Independence: Needs assistance   Gait / Transfers Assistance Needed: used RW Modif I  ADL's / Homemaking Assistance Needed: Caregiver assisted with dressing and toileting per pt        Hand Dominance        Extremity/Trunk Assessment   Upper Extremity Assessment: Defer to OT evaluation           Lower Extremity Assessment: Generalized weakness      Cervical / Trunk Assessment: Kyphotic  Communication   Communication: No difficulties  Cognition Arousal/Alertness: Awake/alert Behavior During Therapy: WFL for tasks assessed/performed Overall Cognitive Status: Within Functional Limits for tasks assessed                      General Comments      Exercises General Exercises - Lower Extremity Ankle Circles/Pumps: AROM;Both;10 reps;Seated Long Arc Quad: AROM;Both;10 reps;Seated      Assessment/Plan    PT Assessment Patient needs continued PT services  PT Diagnosis Generalized weakness   PT Problem List Decreased activity tolerance;Decreased balance;Decreased mobility;Decreased knowledge of use of DME;Decreased safety awareness;Decreased knowledge of precautions;Decreased strength  PT Treatment Interventions DME instruction;Gait training;Therapeutic activities;Functional mobility training;Therapeutic exercise;Balance training;Patient/family education   PT Goals (Current goals can be found in the Care Plan section) Acute Rehab PT Goals Patient Stated Goal: to get better PT Goal Formulation: With patient Time For Goal Achievement: 10/22/15 Potential to Achieve Goals: Good    Frequency Min 3X/week   Barriers to discharge Decreased caregiver support      Co-evaluation               End of Session Equipment Utilized During Treatment: Gait belt;Oxygen Activity Tolerance: Patient limited by fatigue Patient left: in chair;with call bell/phone within reach;with chair alarm  set Nurse Communication: Mobility status         Time: [id number] PT Time Calculation (min) (ACUTE ONLY): 18 min   Charges:   PT Evaluation $PT Eval Moderate Complexity: 1 Procedure     PT G CodesDenice Paradise 10/09/2015, 1:49 PM Shamarion Coots,PT Acute Rehabilitation [phone] (pager)

## 2015-10-08 NOTE — Progress Notes (Signed)
[facility]                                                                              Patient Demographics  Renee [name], is a 82 y.o. female, DOB - 05-13-1929, ID:[id number]  Admit date - 10/07/2015   Admitting Physician Ripudeep Krystal Eaton, MD  Outpatient Primary MD for the patient is [name], MD  LOS - 1   Chief Complaint  Patient presents with  . Shortness of Breath       Brief HPI   Patient is a 82 year old female with diabetes mellitus, CVA, hypertension, chronic kidney disease stage III presented to ED with worsening shortness of breath and difficulty breathing for last 5 days. Patient saw her PCP on 2/14 and was prescribed Z-Pak, Tessalon Perles and albuterol inhaler. Patient reported that her symptoms continued to get worse, she had coughing with yellowish greenish phlegm and pleuritic chest pain with coughing with subjective fevers and chills.ED course: BMET showed bicarbonate of 19, creatinine 1.8, baseline 1.3-1.4, glucose 512, anion gap 13. CBC showed WBC count of 11.1, hemoglobin 10.2, hematocrit 32.6, BNP 186.5 Chest x-ray showed right upper lobe pneumonia, lung base opacity could reflect additional pneumonia, no pulmonary edema.   Assessment & Plan    Principal Problem:  Acute respiratory failure (HCC) with HCAP (healthcare-associated pneumonia):  -Pro-calcitonin 0.65, - Follow blood cultures, urine legionella antigen, urine strep antigen - Placed on IV vancomycin, IV cefepime for HCAP.  - Discontinue the Tamiflu, influenza panel negative - Placed on scheduled bronchodilators duonebs, Pulmicort, flutter valve, Mucinex, Tessalon Perles, IV Solu-Medrol - Swallow evaluation to rule out aspiration   Active Problems:  Diabetes type 2, uncontrolled ([place]) with hyperosmolar syndrome, anion gap 13 - on resistance sliding scale insulin (due to steroid use), meal coverage and Lantus -Increase Lantus to 10 units twice a day  - Obtain hemoglobin  A1c   Essential hypertension, currently borderline - Discontinue amlodipine, continue Toprol-XL  Anxiety -Continue Xanax, Remeron, Zoloft  Hyperlipidemia - Continue Lopid   Acute on chronic kidney disease stage III: Likely worsened due to #1,  - creatinine 1.82 at the time of admission, baseline 1.3-1.4 - Improving, creatinine down to 1.6  Code Status: Full CODE STATUS  Family Communication: Discussed in detail with the patient, all imaging results, lab results explained to the patient    Disposition Plan:   Time Spent in minutes  25 minutes  Procedures  None  Consults   None  DVT Prophylaxis  heparin   Medications  Scheduled Meds: . aspirin  325 mg Oral Daily  . benzonatate  100 mg Oral TID  . budesonide (PULMICORT) nebulizer solution  0.25 mg Nebulization BID  . calcium-vitamin D  1 tablet Oral Daily  . ceFEPime (MAXIPIME) IV  1 g Intravenous Q24H  . cholecalciferol  1,000 Units Oral Daily  . ferrous sulfate  325 mg Oral BID  . gemfibrozil  600 mg Oral Daily  . guaiFENesin  1,200 mg Oral BID  . heparin  5,000 Units Subcutaneous 3 times per day  . insulin aspart  0-20 Units Subcutaneous TID WC  . insulin aspart  0-5 Units  Subcutaneous QHS  . insulin aspart  3 Units Subcutaneous TID WC  . insulin glargine  10 Units Subcutaneous BID  . ipratropium-albuterol  3 mL Nebulization Q4H  . methylPREDNISolone (SOLU-MEDROL) injection  60 mg Intravenous Q12H  . metoprolol succinate  12.5 mg Oral BID  . mirtazapine  7.5 mg Oral QHS  . multivitamin with minerals  1 tablet Oral Daily  . sertraline  50 mg Oral Daily  . vancomycin  750 mg Intravenous Q24H   Continuous Infusions:  PRN Meds:.ALPRAZolam   Antibiotics   Anti-infectives    Start     Dose/Rate Route Frequency Ordered Stop   10/08/15 2100  vancomycin (VANCOCIN) IVPB 750 mg/150 ml premix     750 mg 150 mL/hr over 60 Minutes Intravenous Every 24 hours 10/07/15 2045     10/08/15 1100  ceFEPIme (MAXIPIME) 1  g in dextrose 5 % 50 mL IVPB     1 g 100 mL/hr over 30 Minutes Intravenous Every 24 hours 10/07/15 2046 10/15/15 1059   10/07/15 2130  vancomycin (VANCOCIN) 1,500 mg in sodium chloride 0.9 % 500 mL IVPB     1,500 mg 250 mL/hr over 120 Minutes Intravenous  Once 10/07/15 2045 10/08/15 0056   10/07/15 2045  ceFEPIme (MAXIPIME) 1 g in dextrose 5 % 50 mL IVPB  Status:  Discontinued     1 g 100 mL/hr over 30 Minutes Intravenous 3 times per day 10/07/15 2030 10/07/15 2046   10/07/15 1830  oseltamivir (TAMIFLU) capsule 30 mg  Status:  Discontinued     30 mg Oral Daily 10/07/15 1819 10/08/15 0619   10/07/15 1745  oseltamivir (TAMIFLU) capsule 75 mg  Status:  Discontinued     75 mg Oral 2 times daily 10/07/15 1736 10/07/15 1818   10/07/15 1630  levofloxacin (LEVAQUIN) IVPB 500 mg     500 mg 100 mL/hr over 60 Minutes Intravenous  Once 10/07/15 1626 10/07/15 1818        Subjective:   Renee [name] was seen and examined today.  Feeling somewhat better, shortness of breath is somewhat improving. Still rhonchorous lungs no fevers overnight. Patient denies dizziness, abdominal pain, N/V/D/C, new weakness, numbess, tingling. No acute events overnight.    Objective:   Filed Vitals:   10/08/15 0403 10/08/15 0741 10/08/15 0800 10/08/15 0822  BP:   97/54   Pulse:      Temp:   97.4 F (36.3 C)   TempSrc:   Oral   Resp:   23 25  Height:      Weight:      SpO2: 96% 97% 96% 97%    Intake/Output Summary (Last 24 hours) at 10/08/15 1035 Last data filed at 10/08/15 0400  Gross per 24 hour  Intake 506.67 ml  Output      0 ml  Net 506.67 ml     Wt Readings from Last 3 Encounters:  10/07/15 79.425 kg (175 lb 1.6 oz)  10/04/15 79.742 kg (175 lb 12.8 oz)  09/27/15 82.328 kg (181 lb 8 oz)     Exam  General: Alert and oriented x 3, NAD  HEENT:  PERRLA, EOMI, Anicteric Sclera, mucous membranes moist.   Neck: Supple, no JVD, no masses  CVS: S1 S2 auscultated, no rubs, murmurs or gallops.  Regular rate and rhythm.  Respiratory: Coarse breath sounds throughout  Abdomen: Soft, nontender, nondistended, + bowel sounds  Ext: no cyanosis clubbing or edema  Neuro: no new deficits  Skin: No rashes  Psych: Normal  affect and demeanor, alert and oriented x3    Data Review   Micro Results Recent Results (from the past 240 hour(s))  MRSA PCR Screening     Status: None   Collection Time: 10/07/15  8:35 PM  Result Value Ref Range Status   MRSA by PCR NEGATIVE NEGATIVE Final    Comment:        The GeneXpert MRSA Assay (FDA approved for NASAL specimens only), is one component of a comprehensive MRSA colonization surveillance program. It is not intended to diagnose MRSA infection nor to guide or monitor treatment for MRSA infections.     Radiology Reports Dg Chest 2 View  10/07/2015  CLINICAL DATA:  Cough, congestion, chills x 5 days. Increased resp effort. ?fever. N/v. Non smoker. Hx stroke. ?hx pna. No chest pain. EXAM: CHEST  2 VIEW COMPARISON:  09/04/2012 FINDINGS: There is patchy airspace opacity in the central right upper lobe. There is mild lung base opacity accentuated by low lung volumes, likely atelectasis. No pulmonary edema convincing pleural effusion pneumothorax. Cardiac silhouette is mildly enlarged. Mediastinal hilar masses or evidence adenopathy. Bony thorax is demineralized but grossly intact. IMPRESSION: 1. Right upper lobe pneumonia. 2. Lung base opacity could reflect additional pneumonia but is more likely atelectasis. No pulmonary edema. 3. Mild cardiomegaly. Electronically Signed   By: Lajean Manes M.D.   On: 10/07/2015 16:12    CBC  Recent Labs Lab 10/07/15 1545 10/08/15 0224  WBC 11.1* 7.9  HGB 10.2* 9.8*  HCT 32.6* 31.6*  PLT 185 186  MCV 86.7 86.6  MCH 27.1 26.8  MCHC 31.3 31.0  RDW 13.9 13.9  LYMPHSABS 1.3  --   MONOABS 0.2  --   EOSABS 0.0  --   BASOSABS 0.0  --     Chemistries   Recent Labs Lab 10/07/15 1545 10/08/15 0224    NA 137 140  K 3.5 3.7  CL 105 109  CO2 19* 21*  GLUCOSE 512* 375*  BUN 22* 27*  CREATININE 1.82* 1.66*  CALCIUM 8.3* 8.6*   ------------------------------------------------------------------------------------------------------------------ estimated creatinine clearance is 24.6 mL/min (by C-G formula based on Cr of 1.66). ------------------------------------------------------------------------------------------------------------------  Recent Labs  10/07/15 1545  HGBA1C 7.1*   ------------------------------------------------------------------------------------------------------------------ No results for input(s): CHOL, HDL, LDLCALC, TRIG, CHOLHDL, LDLDIRECT in the last 72 hours. ------------------------------------------------------------------------------------------------------------------ No results for input(s): TSH, T4TOTAL, T3FREE, THYROIDAB in the last 72 hours.  Invalid input(s): FREET3 ------------------------------------------------------------------------------------------------------------------ No results for input(s): VITAMINB12, FOLATE, FERRITIN, TIBC, IRON, RETICCTPCT in the last 72 hours.  Coagulation profile No results for input(s): INR, PROTIME in the last 168 hours.  No results for input(s): DDIMER in the last 72 hours.  Cardiac Enzymes No results for input(s): CKMB, TROPONINI, MYOGLOBIN in the last 168 hours.  Invalid input(s): CK ------------------------------------------------------------------------------------------------------------------ Invalid input(s): [place]  10/07/15 1441 10/07/15 1856 10/07/15 2150 10/08/15 0828  GLUCAP 416* 406* 443* 318*     RAI,RIPUDEEP M.D. [facility] 10/08/2015, 10:35 AM  Pager: [phone] Between 7am to 7pm - call Pager - [phone]  After 7pm go to [email or web address] - password [email or web address]  Call night coverage person covering after 7pm

## 2015-10-08 NOTE — Progress Notes (Signed)
Inpatient Diabetes Program Recommendations  AACE/ADA: New Consensus Statement on Inpatient Glycemic Control (2015)  Target Ranges:  Prepandial:   less than 140 mg/dL      Peak postprandial:   less than 180 mg/dL (1-2 hours)      Critically ill patients:  140 - 180 mg/dL   Review of Glycemic Control  Inpatient Diabetes Program Recommendations:    Consult received for uncontrolled dm. A1C is 7.1%. While on solumedrol, please consider increase in lantus to 15  units bid and continue meal coverage and correction as ordered. Will follow as needed.   Thank you Rosita Kea, RN, MSN, CDE  Diabetes Inpatient Program Office: [phone] Pager: [phone] 8:00 am to 5:00 pm

## 2015-10-08 NOTE — Procedures (Signed)
Pt's left nostril NTS and small yellow secretions obtain and sent to lab.

## 2015-10-09 LAB — GLUCOSE, CAPILLARY
Glucose-Capillary: 244 mg/dL — ABNORMAL HIGH (ref 65–99)
Glucose-Capillary: 219 mg/dL — ABNORMAL HIGH (ref 65–99)
Glucose-Capillary: 183 mg/dL — ABNORMAL HIGH (ref 65–99)
Glucose-Capillary: 283 mg/dL — ABNORMAL HIGH (ref 65–99)
Glucose-Capillary: 293 mg/dL — ABNORMAL HIGH (ref 65–99)

## 2015-10-09 LAB — BASIC METABOLIC PANEL
Anion gap: 9 (ref 5–15)
BUN: 39 mg/dL — ABNORMAL HIGH (ref 6–20)
CO2: 22 mmol/L (ref 22–32)
Calcium: 9 mg/dL (ref 8.9–10.3)
Chloride: 109 mmol/L (ref 101–111)
Creatinine, Ser: 1.55 mg/dL — ABNORMAL HIGH (ref 0.44–1.00)
GFR calc Af Amer: 34 mL/min — ABNORMAL LOW (ref >60)
GFR calc non Af Amer: 30 mL/min — ABNORMAL LOW (ref >60)
Glucose, Bld: 244 mg/dL — ABNORMAL HIGH (ref 65–99)
Potassium: 4.2 mmol/L (ref 3.5–5.1)
Sodium: 140 mmol/L (ref 135–145)

## 2015-10-09 LAB — CBC
HCT: 31.7 % — ABNORMAL LOW (ref 36.0–46.0)
Hemoglobin: 9.9 g/dL — ABNORMAL LOW (ref 12.0–15.0)
MCH: 26.9 pg (ref 26.0–34.0)
MCHC: 31.2 g/dL (ref 30.0–36.0)
MCV: 86.1 fL (ref 78.0–100.0)
Platelets: 257 10*3/uL (ref 150–400)
RBC: 3.68 MIL/uL — ABNORMAL LOW (ref 3.87–5.11)
RDW: 13.9 % (ref 11.5–15.5)
WBC: 14.8 10*3/uL — ABNORMAL HIGH (ref 4.0–10.5)

## 2015-10-09 LAB — PROCALCITONIN: Procalcitonin: 0.53 ng/mL

## 2015-10-09 MED ORDER — INSULIN ASPART 100 UNIT/ML ~~LOC~~ SOLN
5.0000 [IU] | Freq: Three times a day (TID) | SUBCUTANEOUS | Status: DC
  Administered 2015-10-09 – 2015-10-11 (×8): 5 [IU] via SUBCUTANEOUS

## 2015-10-09 MED ORDER — ARFORMOTEROL TARTRATE 15 MCG/2ML IN NEBU
15.0000 ug | INHALATION_SOLUTION | Freq: Two times a day (BID) | RESPIRATORY_TRACT | Status: DC
  Administered 2015-10-09 – 2015-10-12 (×5): 15 ug via RESPIRATORY_TRACT
  Filled 2015-10-09 (×8): qty 2

## 2015-10-09 MED ORDER — AMLODIPINE BESYLATE 5 MG PO TABS
5.0000 mg | ORAL_TABLET | Freq: Every day | ORAL | Status: DC
  Administered 2015-10-09 – 2015-10-12 (×4): 5 mg via ORAL
  Filled 2015-10-09 (×4): qty 1

## 2015-10-09 MED ORDER — IPRATROPIUM-ALBUTEROL 0.5-2.5 (3) MG/3ML IN SOLN: 3.0000 mL | RESPIRATORY_TRACT | Status: DC | PRN

## 2015-10-09 MED ORDER — IPRATROPIUM-ALBUTEROL 0.5-2.5 (3) MG/3ML IN SOLN
3.0000 mL | Freq: Four times a day (QID) | RESPIRATORY_TRACT | Status: DC
  Administered 2015-10-10 – 2015-10-12 (×9): 3 mL via RESPIRATORY_TRACT
  Filled 2015-10-09 (×8): qty 3

## 2015-10-09 NOTE — Progress Notes (Signed)
[facility]                                                                              Patient Demographics  Renee [name], is a 82 y.o. female, DOB - 12/05/1928, ID:[id number]  Admit date - 10/07/2015   Admitting Physician Ripudeep Krystal Eaton, MD  Outpatient Primary MD for the patient is [name], MD  LOS - 2   Chief Complaint  Patient presents with  . Shortness of Breath       Brief HPI   Patient is a 82 year old female with diabetes mellitus, CVA, hypertension, chronic kidney disease stage III presented to ED with worsening shortness of breath and difficulty breathing for last 5 days. Patient saw her PCP on 2/14 and was prescribed Z-Pak, Tessalon Perles and albuterol inhaler. Patient reported that her symptoms continued to get worse, she had coughing with yellowish greenish phlegm and pleuritic chest pain with coughing with subjective fevers and chills.ED course: BMET showed bicarbonate of 19, creatinine 1.8, baseline 1.3-1.4, glucose 512, anion gap 13. CBC showed WBC count of 11.1, hemoglobin 10.2, hematocrit 32.6, BNP 186.5 Chest x-ray showed right upper lobe pneumonia, lung base opacity could reflect additional pneumonia, no pulmonary edema.   Assessment & Plan    Principal Problem:  Acute respiratory failure ([place]) with HCAP (healthcare-associated pneumonia):  -Pro-calcitonin 0.65, blood cultures negative to date - urine legionella antigen, urine strep antigen still pending  - Placed on IV vancomycin, IV cefepime for HCAP.  - influenza panel negative - Placed on scheduled bronchodilators duonebs, Pulmicort, flutter valve, Mucinex, Tessalon Perles, IV Solu-Medrol - Swallow evaluation to rule out aspiration   Active Problems:  Diabetes type 2, uncontrolled ([place]) with hyperosmolar syndrome, anion gap 13 - on resistance sliding scale insulin (due to steroid use), meal coverage and Lantus - Increase Lantus to 10 units twice a day  -  hemoglobin A1c  7.1   Essential hypertension, currently borderline - continue Toprol-XL, amlodipine   Anxiety -Continue Xanax, Remeron, Zoloft  Hyperlipidemia - Continue Lopid   Acute on chronic kidney disease stage III: Likely worsened due to #1,  - creatinine 1.82 at the time of admission, baseline 1.3-1.4 - Improving, creatinine down to 1.5  Code Status: Full CODE STATUS  Family Communication: Discussed in detail with the patient, all imaging results, lab results explained to the patient    Disposition Plan: transfer to tele   Time Spent in minutes  25 minutes  Procedures  None  Consults   None  DVT Prophylaxis  heparin   Medications  Scheduled Meds: . arformoterol  15 mcg Nebulization BID  . aspirin  325 mg Oral Daily  . benzonatate  100 mg Oral TID  . budesonide (PULMICORT) nebulizer solution  0.25 mg Nebulization BID  . calcium-vitamin D  1 tablet Oral Daily  . ceFEPime (MAXIPIME) IV  1 g Intravenous Q24H  . cholecalciferol  1,000 Units Oral Daily  . ferrous sulfate  325 mg Oral BID  . gemfibrozil  600 mg Oral Daily  . guaiFENesin  1,200 mg Oral BID  . heparin  5,000 Units Subcutaneous 3 times per day  .  insulin aspart  0-20 Units Subcutaneous TID WC  . insulin aspart  0-5 Units Subcutaneous QHS  . insulin aspart  5 Units Subcutaneous TID WC  . insulin glargine  10 Units Subcutaneous BID  . ipratropium-albuterol  3 mL Nebulization Q4H  . methylPREDNISolone (SOLU-MEDROL) injection  60 mg Intravenous Q12H  . metoprolol succinate  12.5 mg Oral BID  . mirtazapine  7.5 mg Oral QHS  . multivitamin with minerals  1 tablet Oral Daily  . sertraline  50 mg Oral Daily  . vancomycin  750 mg Intravenous Q24H   Continuous Infusions:  PRN Meds:.ALPRAZolam, docusate sodium   Antibiotics   Anti-infectives    Start     Dose/Rate Route Frequency Ordered Stop   10/08/15 2100  vancomycin (VANCOCIN) IVPB 750 mg/150 ml premix     750 mg 150 mL/hr over 60 Minutes Intravenous Every  24 hours 10/07/15 2045     10/08/15 1100  ceFEPIme (MAXIPIME) 1 g in dextrose 5 % 50 mL IVPB     1 g 100 mL/hr over 30 Minutes Intravenous Every 24 hours 10/07/15 2046 10/15/15 1059   10/07/15 2130  vancomycin (VANCOCIN) 1,500 mg in sodium chloride 0.9 % 500 mL IVPB     1,500 mg 250 mL/hr over 120 Minutes Intravenous  Once 10/07/15 2045 10/08/15 0056   10/07/15 2045  ceFEPIme (MAXIPIME) 1 g in dextrose 5 % 50 mL IVPB  Status:  Discontinued     1 g 100 mL/hr over 30 Minutes Intravenous 3 times per day 10/07/15 2030 10/07/15 2046   10/07/15 1830  oseltamivir (TAMIFLU) capsule 30 mg  Status:  Discontinued     30 mg Oral Daily 10/07/15 1819 10/08/15 0619   10/07/15 1745  oseltamivir (TAMIFLU) capsule 75 mg  Status:  Discontinued     75 mg Oral 2 times daily 10/07/15 1736 10/07/15 1818   10/07/15 1630  levofloxacin (LEVAQUIN) IVPB 500 mg     500 mg 100 mL/hr over 60 Minutes Intravenous  Once 10/07/15 1626 10/07/15 1818        Subjective:   Renee [name] was seen and examined today.  Improving, shortness of breath is improving. No fevers overnight. Patient denies dizziness, abdominal pain, N/V/D/C, new weakness, numbess, tingling. No acute events overnight.    Objective:   Filed Vitals:   10/09/15 0400 10/09/15 0700 10/09/15 0953 10/09/15 0957  BP: 130/65 146/64    Pulse: 69 66    Temp: 97.8 F (36.6 C) 97.6 F (36.4 C)    TempSrc: Oral Oral    Resp: 24 24    Height:      Weight:      SpO2: 93% 97% 96% 99%    Intake/Output Summary (Last 24 hours) at 10/09/15 1028 Last data filed at 10/09/15 0900  Gross per 24 hour  Intake    440 ml  Output      0 ml  Net    440 ml     Wt Readings from Last 3 Encounters:  10/07/15 79.425 kg (175 lb 1.6 oz)  10/04/15 79.742 kg (175 lb 12.8 oz)  09/27/15 82.328 kg (181 lb 8 oz)     Exam  General: Alert and oriented x 3, NAD  HEENT:    Neck: Supple, no JVD  CVS: S1 S2clear. Regular rate and rhythm.  Respiratory: Scattered  bilateral rhonchi but improving   Abdomen: Soft, nontender, nondistended, + bowel sounds  Ext: no cyanosis clubbing or edema  Neuro: no new deficits  Skin: No rashes  Psych: Normal affect and demeanor, alert and oriented x3    Data Review   Micro Results Recent Results (from the past 240 hour(s))  MRSA PCR Screening     Status: None   Collection Time: 10/07/15  8:35 PM  Result Value Ref Range Status   MRSA by PCR NEGATIVE NEGATIVE Final    Comment:        The GeneXpert MRSA Assay (FDA approved for NASAL specimens only), is one component of a comprehensive MRSA colonization surveillance program. It is not intended to diagnose MRSA infection nor to guide or monitor treatment for MRSA infections.   Culture, blood (routine x 2) Call MD if unable to obtain prior to antibiotics being given     Status: None (Preliminary result)   Collection Time: 10/07/15  9:00 PM  Result Value Ref Range Status   Specimen Description BLOOD LEFT ANTECUBITAL  Final   Special Requests BOTTLES DRAWN AEROBIC ONLY 10CC  Final   Culture NO GROWTH < 24 HOURS  Final   Report Status PENDING  Incomplete  Culture, blood (routine x 2) Call MD if unable to obtain prior to antibiotics being given     Status: None (Preliminary result)   Collection Time: 10/07/15  9:05 PM  Result Value Ref Range Status   Specimen Description BLOOD LEFT HAND  Final   Special Requests IN PEDIATRIC BOTTLE 1.5CC  Final   Culture NO GROWTH < 24 HOURS  Final   Report Status PENDING  Incomplete    Radiology Reports Dg Chest 2 View  10/07/2015  CLINICAL DATA:  Cough, congestion, chills x 5 days. Increased resp effort. ?fever. N/v. Non smoker. Hx stroke. ?hx pna. No chest pain. EXAM: CHEST  2 VIEW COMPARISON:  09/04/2012 FINDINGS: There is patchy airspace opacity in the central right upper lobe. There is mild lung base opacity accentuated by low lung volumes, likely atelectasis. No pulmonary edema convincing pleural effusion  pneumothorax. Cardiac silhouette is mildly enlarged. Mediastinal hilar masses or evidence adenopathy. Bony thorax is demineralized but grossly intact. IMPRESSION: 1. Right upper lobe pneumonia. 2. Lung base opacity could reflect additional pneumonia but is more likely atelectasis. No pulmonary edema. 3. Mild cardiomegaly. Electronically Signed   By: Lajean Manes M.D.   On: 10/07/2015 16:12    CBC  Recent Labs Lab 10/07/15 1545 10/08/15 0224 10/09/15 0231  WBC 11.1* 7.9 14.8*  HGB 10.2* 9.8* 9.9*  HCT 32.6* 31.6* 31.7*  PLT 185 186 257  MCV 86.7 86.6 86.1  MCH 27.1 26.8 26.9  MCHC 31.3 31.0 31.2  RDW 13.9 13.9 13.9  LYMPHSABS 1.3  --   --   MONOABS 0.2  --   --   EOSABS 0.0  --   --   BASOSABS 0.0  --   --     Chemistries   Recent Labs Lab 10/07/15 1545 10/08/15 0224 10/09/15 0231  NA 137 140 140  K 3.5 3.7 4.2  CL 105 109 109  CO2 19* 21* 22  GLUCOSE 512* 375* 244*  BUN 22* 27* 39*  CREATININE 1.82* 1.66* 1.55*  CALCIUM 8.3* 8.6* 9.0   ------------------------------------------------------------------------------------------------------------------ estimated creatinine clearance is 26.4 mL/min (by C-G formula based on Cr of 1.55). ------------------------------------------------------------------------------------------------------------------  Recent Labs  10/07/15 1545  HGBA1C 7.1*   ------------------------------------------------------------------------------------------------------------------ No results for input(s): CHOL, HDL, LDLCALC, TRIG, CHOLHDL, LDLDIRECT in the last 72 hours. ------------------------------------------------------------------------------------------------------------------ No results for input(s): TSH, T4TOTAL, T3FREE, THYROIDAB in the last 72 hours.  Invalid  input(s): FREET3 ------------------------------------------------------------------------------------------------------------------ No results for input(s): VITAMINB12,  FOLATE, FERRITIN, TIBC, IRON, RETICCTPCT in the last 72 hours.  Coagulation profile No results for input(s): INR, PROTIME in the last 168 hours.  No results for input(s): DDIMER in the last 72 hours.  Cardiac Enzymes No results for input(s): CKMB, TROPONINI, MYOGLOBIN in the last 168 hours.  Invalid input(s): CK ------------------------------------------------------------------------------------------------------------------ Invalid input(s): [place]  10/07/15 2150 10/08/15 0828 10/08/15 1153 10/08/15 1541 10/08/15 2101 10/09/15 0729  GLUCAP 443* 318* 274* 255* 204* 244*     RAI,RIPUDEEP M.D. [facility] 10/09/2015, 10:28 AM  Pager: [phone] Between 7am to 7pm - call Pager - [phone]  After 7pm go to [email or web address] - password [email or web address]  Call night coverage person covering after 7pm

## 2015-10-09 NOTE — Progress Notes (Signed)
Patient placed on telemetry per MD order and CMT notified.

## 2015-10-09 NOTE — Progress Notes (Signed)
Patient arrived on floor via wheelchair from [facility].  Family at bedside.

## 2015-10-10 ENCOUNTER — Encounter (HOSPITAL_COMMUNITY): Payer: Self-pay | Admitting: General Practice

## 2015-10-10 LAB — URINALYSIS, ROUTINE W REFLEX MICROSCOPIC
Bilirubin Urine: NEGATIVE
Glucose, UA: NEGATIVE mg/dL
Hgb urine dipstick: NEGATIVE
Ketones, ur: NEGATIVE mg/dL
Leukocytes, UA: NEGATIVE
Nitrite: NEGATIVE
Protein, ur: NEGATIVE mg/dL
Specific Gravity, Urine: 1.026 (ref 1.005–1.030)
pH: 5.5 (ref 5.0–8.0)

## 2015-10-10 LAB — CBC
HCT: 30 % — ABNORMAL LOW (ref 36.0–46.0)
Hemoglobin: 9.8 g/dL — ABNORMAL LOW (ref 12.0–15.0)
MCH: 28.5 pg (ref 26.0–34.0)
MCHC: 32.7 g/dL (ref 30.0–36.0)
MCV: 87.2 fL (ref 78.0–100.0)
Platelets: 262 10*3/uL (ref 150–400)
RBC: 3.44 MIL/uL — ABNORMAL LOW (ref 3.87–5.11)
RDW: 14 % (ref 11.5–15.5)
WBC: 12.4 10*3/uL — ABNORMAL HIGH (ref 4.0–10.5)

## 2015-10-10 LAB — BASIC METABOLIC PANEL
Anion gap: 9 (ref 5–15)
BUN: 51 mg/dL — ABNORMAL HIGH (ref 6–20)
CO2: 22 mmol/L (ref 22–32)
Calcium: 8.7 mg/dL — ABNORMAL LOW (ref 8.9–10.3)
Chloride: 108 mmol/L (ref 101–111)
Creatinine, Ser: 1.51 mg/dL — ABNORMAL HIGH (ref 0.44–1.00)
GFR calc Af Amer: 35 mL/min — ABNORMAL LOW (ref >60)
GFR calc non Af Amer: 31 mL/min — ABNORMAL LOW (ref >60)
Glucose, Bld: 262 mg/dL — ABNORMAL HIGH (ref 65–99)
Potassium: 4.4 mmol/L (ref 3.5–5.1)
Sodium: 139 mmol/L (ref 135–145)

## 2015-10-10 LAB — GLUCOSE, CAPILLARY
Glucose-Capillary: 226 mg/dL — ABNORMAL HIGH (ref 65–99)
Glucose-Capillary: 363 mg/dL — ABNORMAL HIGH (ref 65–99)
Glucose-Capillary: 172 mg/dL — ABNORMAL HIGH (ref 65–99)
Glucose-Capillary: 92 mg/dL (ref 65–99)

## 2015-10-10 LAB — STREP PNEUMONIAE URINARY ANTIGEN: Strep Pneumo Urinary Antigen: NEGATIVE

## 2015-10-10 MED ORDER — CETYLPYRIDINIUM CHLORIDE 0.05 % MT LIQD
7.0000 mL | Freq: Two times a day (BID) | OROMUCOSAL | Status: DC
  Administered 2015-10-10 – 2015-10-12 (×3): 7 mL via OROMUCOSAL

## 2015-10-10 MED ORDER — INSULIN GLARGINE 100 UNIT/ML ~~LOC~~ SOLN
15.0000 [IU] | Freq: Two times a day (BID) | SUBCUTANEOUS | Status: DC
  Administered 2015-10-10 – 2015-10-11 (×3): 15 [IU] via SUBCUTANEOUS
  Filled 2015-10-10 (×4): qty 0.15

## 2015-10-10 NOTE — Progress Notes (Signed)
Physical Therapy Treatment Patient Details Name: Renee [name] MRN: [id number] DOB: 03/15/1929 Today's Date: 10/10/2015    History of Present Illness Patient is a 82 year old female with diabetes mellitus, CVA, hypertension, chronic kidney disease stage III presented to ED with worsening shortness of breath and difficulty breathing for last 5 days. Patient saw her PCP on 2/14 and was prescribed Z-Pak, Tessalon Perles and albuterol inhaler. Patient reported that her symptoms continued to get worse, she had coughing with yellowish greenish phlegm and pleuritic chest pain with coughing with subjective fevers and chills.ED course: BMET showed bicarbonate of 19, creatinine 1.8, baseline 1.3-1.4, glucose 512, anion gap 13. CBC showed WBC count of 11.1, hemoglobin 10.2, hematocrit 32.6, BNP 186.5    PT Comments    Pt making good gains in activity tolerance. Did not use O2 during session and pt remained stable at 96% with increased ambulation distance. Pt demonstrates decreased stability when first rising to stand requiring Min A to steady however w/ RW displays safe gait and awareness. Will continue to progress pt's activity and recommending SNF with 24/7 supervision/assistance at D/C to maximize recovery.  Follow Up Recommendations  SNF;Supervision/Assistance - 24 hour     Equipment Recommendations  None recommended by PT    Recommendations for Other Services       Precautions / Restrictions Precautions Precautions: Fall Restrictions Weight Bearing Restrictions: No    Mobility  Bed Mobility               General bed mobility comments: up in bed  Transfers Overall transfer level: Needs assistance Equipment used: Rolling walker (2 wheeled) Transfers: Sit to/from Stand Sit to Stand: Min assist         General transfer comment: Pt needing steadying upon standing, buckling in B LE, improved with static standing, needed Min A for initial boost  Ambulation/Gait Ambulation/Gait  assistance: Min assist Ambulation Distance (Feet): 150 Feet Assistive device: Rolling walker (2 wheeled) Gait Pattern/deviations: Step-through pattern;Decreased stride length;Shuffle;Wide base of support Gait velocity: decreased Gait velocity interpretation: Below normal speed for age/gender General Gait Details: no buckling noted, cues for staying close to RW and to clear feet from floor, cues for breathing, O2 on RA >96% throughout   Stairs            Wheelchair Mobility    Modified Rankin (Stroke Patients Only)       Balance Overall balance assessment: Needs assistance Sitting-balance support: No upper extremity supported;Feet supported Sitting balance-Leahy Scale: Fair     Standing balance support: Bilateral upper extremity supported;During functional activity Standing balance-Leahy Scale: Poor Standing balance comment: relies on RW while standing                    Cognition Arousal/Alertness: Awake/alert Behavior During Therapy: WFL for tasks assessed/performed Overall Cognitive Status: Within Functional Limits for tasks assessed                      Exercises      General Comments General comments (skin integrity, edema, etc.): RA O2% 96-98% with activtiy      Pertinent Vitals/Pain Pain Assessment: No/denies pain    Home Living                      Prior Function            PT Goals (current goals can now be found in the care plan section) Acute Rehab PT Goals Patient Stated Goal:  get better Progress towards PT goals: Progressing toward goals    Frequency  Min 3X/week    PT Plan Current plan remains appropriate    Co-evaluation             End of Session Equipment Utilized During Treatment: Gait belt Activity Tolerance: Patient limited by fatigue Patient left: in chair;with call bell/phone within reach     Time: 1322-1338 PT Time Calculation (min) (ACUTE ONLY): 16 min  Charges:  $Gait Training: 8-22  mins                    G Codes:      Ara Kussmaul 11/07/2015, 4:16 PM  Ara Kussmaul, Student Physical Therapist Acute Rehab [phone]

## 2015-10-10 NOTE — Progress Notes (Signed)
[facility]                                                                              Patient Demographics  Renee [name], is a 82 y.o. female, DOB - 03-21-1929, [id number]  Admit date - 10/07/2015   Admitting Physician Blair Mesina Krystal Eaton, MD  Outpatient Primary MD for the patient is [name], MD  LOS - 3   Chief Complaint  Patient presents with  . Shortness of Breath       Brief HPI   Patient is a 82 year old female with diabetes mellitus, CVA, hypertension, chronic kidney disease stage III presented to ED with worsening shortness of breath and difficulty breathing for last 5 days. Patient saw her PCP on 2/14 and was prescribed Z-Pak, Tessalon Perles and albuterol inhaler. Patient reported that her symptoms continued to get worse, she had coughing with yellowish greenish phlegm and pleuritic chest pain with coughing with subjective fevers and chills.ED course: BMET showed bicarbonate of 19, creatinine 1.8, baseline 1.3-1.4, glucose 512, anion gap 13. CBC showed WBC count of 11.1, hemoglobin 10.2, hematocrit 32.6, BNP 186.5 Chest x-ray showed right upper lobe pneumonia, lung base opacity could reflect additional pneumonia, no pulmonary edema.   Assessment & Plan    Principal Problem:  Acute respiratory failure ([place]) with HCAP (healthcare-associated pneumonia): Improving -Pro-calcitonin 0.65, blood cultures negative to date - urine legionella antigen, urine strep antigen still pending  - Placed on IV vancomycin, IV cefepime for HCAP.  - influenza panel negative - Placed on scheduled bronchodilators duonebs, Pulmicort, flutter valve, Mucinex, Tessalon Perles, IV Solu-Medrol - Swallow evaluation -> no out aspiration - Home O2 evaluation prior to discharge - Repeat chest x-ray in am  Active Problems:  Diabetes type 2, uncontrolled ([place]) with hyperosmolar syndrome, anion gap 13 - on resistance sliding scale insulin (due to steroid use), meal coverage and  Lantus - Increase Lantus to 15 units twice a day  -  hemoglobin A1c 7.1   Essential hypertension, currently borderline - continue Toprol-XL, amlodipine   Anxiety -Continue Xanax, Remeron, Zoloft  Hyperlipidemia - Continue Lopid   Acute on chronic kidney disease stage III: Likely worsened due to #1,  - creatinine 1.82 at the time of admission, baseline 1.3-1.4 - Improving, creatinine down to 1.5  Code Status: Full CODE STATUS  Family Communication: Discussed in detail with the patient, all imaging results, lab results explained to the patient    Disposition Plan: Hopefully DC home in 24-48 hours  Time Spent in minutes  25 minutes  Procedures  None  Consults   None  DVT Prophylaxis  heparin   Medications  Scheduled Meds: . amLODipine  5 mg Oral Daily  . arformoterol  15 mcg Nebulization BID  . aspirin  325 mg Oral Daily  . benzonatate  100 mg Oral TID  . budesonide (PULMICORT) nebulizer solution  0.25 mg Nebulization BID  . calcium-vitamin D  1 tablet Oral Daily  . ceFEPime (MAXIPIME) IV  1 g Intravenous Q24H  . cholecalciferol  1,000 Units Oral Daily  . ferrous sulfate  325 mg Oral BID  . gemfibrozil  600 mg Oral  Daily  . guaiFENesin  1,200 mg Oral BID  . heparin  5,000 Units Subcutaneous 3 times per day  . insulin aspart  0-20 Units Subcutaneous TID WC  . insulin aspart  0-5 Units Subcutaneous QHS  . insulin aspart  5 Units Subcutaneous TID WC  . insulin glargine  10 Units Subcutaneous BID  . ipratropium-albuterol  3 mL Nebulization QID  . methylPREDNISolone (SOLU-MEDROL) injection  60 mg Intravenous Q12H  . metoprolol succinate  12.5 mg Oral BID  . mirtazapine  7.5 mg Oral QHS  . multivitamin with minerals  1 tablet Oral Daily  . sertraline  50 mg Oral Daily  . vancomycin  750 mg Intravenous Q24H   Continuous Infusions:  PRN Meds:.ALPRAZolam, docusate sodium, ipratropium-albuterol   Antibiotics   Anti-infectives    Start     Dose/Rate Route  Frequency Ordered Stop   10/08/15 2100  vancomycin (VANCOCIN) IVPB 750 mg/150 ml premix     750 mg 150 mL/hr over 60 Minutes Intravenous Every 24 hours 10/07/15 2045     10/08/15 1100  ceFEPIme (MAXIPIME) 1 g in dextrose 5 % 50 mL IVPB     1 g 100 mL/hr over 30 Minutes Intravenous Every 24 hours 10/07/15 2046 10/15/15 1059   10/07/15 2130  vancomycin (VANCOCIN) 1,500 mg in sodium chloride 0.9 % 500 mL IVPB     1,500 mg 250 mL/hr over 120 Minutes Intravenous  Once 10/07/15 2045 10/08/15 0056   10/07/15 2045  ceFEPIme (MAXIPIME) 1 g in dextrose 5 % 50 mL IVPB  Status:  Discontinued     1 g 100 mL/hr over 30 Minutes Intravenous 3 times per day 10/07/15 2030 10/07/15 2046   10/07/15 1830  oseltamivir (TAMIFLU) capsule 30 mg  Status:  Discontinued     30 mg Oral Daily 10/07/15 1819 10/08/15 0619   10/07/15 1745  oseltamivir (TAMIFLU) capsule 75 mg  Status:  Discontinued     75 mg Oral 2 times daily 10/07/15 1736 10/07/15 1818   10/07/15 1630  levofloxacin (LEVAQUIN) IVPB 500 mg     500 mg 100 mL/hr over 60 Minutes Intravenous  Once 10/07/15 1626 10/07/15 1818        Subjective:   Renee [name] was seen and examined today. Overall improving no chest pain, shortness of breath is improving. No fevers or chills. Lung sounds is still quite rhonchorous. No fevers overnight. Patient denies dizziness, abdominal pain, N/V/D/C, new weakness, numbess, tingling. No acute events overnight.    Objective:   Filed Vitals:   10/09/15 2036 10/10/15 0524 10/10/15 0908 10/10/15 1208  BP:  124/58    Pulse:  83    Temp:  98.4 F (36.9 C)    TempSrc:  Oral    Resp:  20    Height:      Weight:      SpO2: 97% 98% 98% 96%    Intake/Output Summary (Last 24 hours) at 10/10/15 1255 Last data filed at 10/10/15 [id number]  Gross per 24 hour  Intake      0 ml  Output    400 ml  Net   -400 ml     Wt Readings from Last 3 Encounters:  10/07/15 79.425 kg (175 lb 1.6 oz)  10/04/15 79.742 kg (175 lb 12.8 oz)   09/27/15 82.328 kg (181 lb 8 oz)     Exam  General: Alert and oriented x 3, NAD  HEENT:    Neck: Supple, no JVD  CVS: S1 S2clear.  Regular rate and rhythm.  Respiratory: Scattered bilateral rhonchi  Abdomen: Soft, nontender, nondistended, + bowel sounds  Ext: no cyanosis clubbing or edema  Neuro: no new deficits  Skin: No rashes  Psych: Normal affect and demeanor, alert and oriented x3    Data Review   Micro Results Recent Results (from the past 240 hour(s))  MRSA PCR Screening     Status: None   Collection Time: 10/07/15  8:35 PM  Result Value Ref Range Status   MRSA by PCR NEGATIVE NEGATIVE Final    Comment:        The GeneXpert MRSA Assay (FDA approved for NASAL specimens only), is one component of a comprehensive MRSA colonization surveillance program. It is not intended to diagnose MRSA infection nor to guide or monitor treatment for MRSA infections.   Culture, blood (routine x 2) Call MD if unable to obtain prior to antibiotics being given     Status: None (Preliminary result)   Collection Time: 10/07/15  9:00 PM  Result Value Ref Range Status   Specimen Description BLOOD LEFT ANTECUBITAL  Final   Special Requests BOTTLES DRAWN AEROBIC ONLY 10CC  Final   Culture NO GROWTH 2 DAYS  Final   Report Status PENDING  Incomplete  Culture, blood (routine x 2) Call MD if unable to obtain prior to antibiotics being given     Status: None (Preliminary result)   Collection Time: 10/07/15  9:05 PM  Result Value Ref Range Status   Specimen Description BLOOD LEFT HAND  Final   Special Requests IN PEDIATRIC BOTTLE 1.5CC  Final   Culture NO GROWTH 2 DAYS  Final   Report Status PENDING  Incomplete  Culture, respiratory (NON-Expectorated)     Status: None (Preliminary result)   Collection Time: 10/08/15  5:28 AM  Result Value Ref Range Status   Specimen Description SPUTUM  Final   Special Requests NONE  Final   Gram Stain   Final    ABUNDANT WBC PRESENT,BOTH PMN  AND MONONUCLEAR FEW SQUAMOUS EPITHELIAL CELLS PRESENT FEW GRAM POSITIVE COCCI IN CLUSTERS FEW GRAM POSITIVE COCCI IN PAIRS Performed at [facility]    Culture   Final    Culture reincubated for better growth Performed at [facility]    Report Status PENDING  Incomplete    Radiology Reports Dg Chest 2 View  10/07/2015  CLINICAL DATA:  Cough, congestion, chills x 5 days. Increased resp effort. ?fever. N/v. Non smoker. Hx stroke. ?hx pna. No chest pain. EXAM: CHEST  2 VIEW COMPARISON:  09/04/2012 FINDINGS: There is patchy airspace opacity in the central right upper lobe. There is mild lung base opacity accentuated by low lung volumes, likely atelectasis. No pulmonary edema convincing pleural effusion pneumothorax. Cardiac silhouette is mildly enlarged. Mediastinal hilar masses or evidence adenopathy. Bony thorax is demineralized but grossly intact. IMPRESSION: 1. Right upper lobe pneumonia. 2. Lung base opacity could reflect additional pneumonia but is more likely atelectasis. No pulmonary edema. 3. Mild cardiomegaly. Electronically Signed   By: Lajean Manes M.D.   On: 10/07/2015 16:12    CBC  Recent Labs Lab 10/07/15 1545 10/08/15 0224 10/09/15 0231 10/10/15 0710  WBC 11.1* 7.9 14.8* 12.4*  HGB 10.2* 9.8* 9.9* 9.8*  HCT 32.6* 31.6* 31.7* 30.0*  PLT 185 186 257 262  MCV 86.7 86.6 86.1 87.2  MCH 27.1 26.8 26.9 28.5  MCHC 31.3 31.0 31.2 32.7  RDW 13.9 13.9 13.9 14.0  LYMPHSABS 1.3  --   --   --  MONOABS 0.2  --   --   --   EOSABS 0.0  --   --   --   BASOSABS 0.0  --   --   --     Chemistries   Recent Labs Lab 10/07/15 1545 10/08/15 0224 10/09/15 0231 10/10/15 0710  NA 137 140 140 139  K 3.5 3.7 4.2 4.4  CL 105 109 109 108  CO2 19* 21* 22 22  GLUCOSE 512* 375* 244* 262*  BUN 22* 27* 39* 51*  CREATININE 1.82* 1.66* 1.55* 1.51*  CALCIUM 8.3* 8.6* 9.0 8.7*    ------------------------------------------------------------------------------------------------------------------ estimated creatinine clearance is 27.1 mL/min (by C-G formula based on Cr of 1.51). ------------------------------------------------------------------------------------------------------------------  Recent Labs  10/07/15 1545  HGBA1C 7.1*   ------------------------------------------------------------------------------------------------------------------ No results for input(s): CHOL, HDL, LDLCALC, TRIG, CHOLHDL, LDLDIRECT in the last 72 hours. ------------------------------------------------------------------------------------------------------------------ No results for input(s): TSH, T4TOTAL, T3FREE, THYROIDAB in the last 72 hours.  Invalid input(s): FREET3 ------------------------------------------------------------------------------------------------------------------ No results for input(s): VITAMINB12, FOLATE, FERRITIN, TIBC, IRON, RETICCTPCT in the last 72 hours.  Coagulation profile No results for input(s): INR, PROTIME in the last 168 hours.  No results for input(s): DDIMER in the last 72 hours.  Cardiac Enzymes No results for input(s): CKMB, TROPONINI, MYOGLOBIN in the last 168 hours.  Invalid input(s): CK ------------------------------------------------------------------------------------------------------------------ Invalid input(s): [place]  10/09/15 0729 10/09/15 1209 10/09/15 1602 10/09/15 1808 10/09/15 2027 10/10/15 0721  GLUCAP 244* 219* 183* 283* 293* 226*     Alauna Hayden M.D. [facility] 10/10/2015, 12:55 PM  Pager: [phone] Between 7am to 7pm - call Pager - [phone]  After 7pm go to [email or web address] - password [email or web address]  Call night coverage person covering after 7pm

## 2015-10-10 NOTE — Progress Notes (Signed)
Pharmacy Antibiotic Note  Renee [name] is a 82 y.o. female admitted on 10/07/2015 with pneumonia. On D#4 of vancomycin / cefepime.   Plan: -Vancomycin 750 IV q24h- recommend stopping as MRSA PCR negative which has a high (>98%) negative predictive value in correlating with negative MRSA PNA -Cefepime to 1 g IV q24h -Monitor renal fx, cultures, duration of therapy  Height: 5\' 2"  (157.5 cm) Weight: 175 lb 1.6 oz (79.425 kg) IBW/kg (Calculated) : 50.1  Temp (24hrs), Avg:97.9 F (36.6 C), Min:97.3 F (36.3 C), Max:98.4 F (36.9 C)   Recent Labs Lab 10/07/15 1545 10/08/15 0224 10/09/15 0231 10/10/15 0710  WBC 11.1* 7.9 14.8* 12.4*  CREATININE 1.82* 1.66* 1.55* 1.51*    Estimated Creatinine Clearance: 27.1 mL/min (by C-G formula based on Cr of 1.51).    Allergies  Allergen Reactions  . Diclofenac Sodium Other (See Comments)    REACTION: angioedema  . Pioglitazone Swelling    Antimicrobials this admission: cefepime 2/17>> vancomycin 2/17>>  Levels/Dose adjustments this admission: N/A  Cultures: 2/18 Resp cx: no result 2/17 Blood cx: ngtd MRSA PCR negative   Legionella and strep pneumo antigens were ordered, but have not yet been collected and sent  Thank you for allowing pharmacy to be a part of this patient's care.  Dylin Ihnen D. [name], PharmD, BCPS Clinical Pharmacist Pager: [phone] 10/10/2015 11:07 AM

## 2015-10-10 NOTE — Care Management Important Message (Signed)
Important Message  Patient Details  Name: Renee [name] MRN: [id number] Date of Birth: 1928/08/27   Medicare Important Message Given:  Yes    Loann Quill 10/10/2015, 11:36 AM

## 2015-10-10 NOTE — Clinical Social Work Note (Signed)
CSW met with patient and niece at bedside due to consult for SNF- PT recommendation.  Patient states she has a caregiver who stays with her from 9:30am-3:30pm and is wishing to return home at time of discharge.  Patient is interested in home PT/OT if this is an option.  RNCM consulted.  CSW signing off.  Please re-consult should patient need SNF at time of discharge.  Nonnie Done, LCSW [phone]  [id number]; 2S 15-16 and [place] Licensed Clinical Social Worker

## 2015-10-11 ENCOUNTER — Inpatient Hospital Stay (HOSPITAL_COMMUNITY): Payer: Medicare Other

## 2015-10-11 LAB — CBC
HCT: 31.9 % — ABNORMAL LOW (ref 36.0–46.0)
Hemoglobin: 9.9 g/dL — ABNORMAL LOW (ref 12.0–15.0)
MCH: 26.9 pg (ref 26.0–34.0)
MCHC: 31 g/dL (ref 30.0–36.0)
MCV: 86.7 fL (ref 78.0–100.0)
Platelets: 285 10*3/uL (ref 150–400)
RBC: 3.68 MIL/uL — ABNORMAL LOW (ref 3.87–5.11)
RDW: 13.8 % (ref 11.5–15.5)
WBC: 12.1 10*3/uL — ABNORMAL HIGH (ref 4.0–10.5)

## 2015-10-11 LAB — BASIC METABOLIC PANEL
Anion gap: 8 (ref 5–15)
BUN: 50 mg/dL — ABNORMAL HIGH (ref 6–20)
CO2: 23 mmol/L (ref 22–32)
Calcium: 8.5 mg/dL — ABNORMAL LOW (ref 8.9–10.3)
Chloride: 108 mmol/L (ref 101–111)
Creatinine, Ser: 1.56 mg/dL — ABNORMAL HIGH (ref 0.44–1.00)
GFR calc Af Amer: 34 mL/min — ABNORMAL LOW (ref >60)
GFR calc non Af Amer: 29 mL/min — ABNORMAL LOW (ref >60)
Glucose, Bld: 211 mg/dL — ABNORMAL HIGH (ref 65–99)
Potassium: 5 mmol/L (ref 3.5–5.1)
Sodium: 139 mmol/L (ref 135–145)

## 2015-10-11 LAB — GLUCOSE, CAPILLARY
Glucose-Capillary: 186 mg/dL — ABNORMAL HIGH (ref 65–99)
Glucose-Capillary: 218 mg/dL — ABNORMAL HIGH (ref 65–99)
Glucose-Capillary: 150 mg/dL — ABNORMAL HIGH (ref 65–99)
Glucose-Capillary: 103 mg/dL — ABNORMAL HIGH (ref 65–99)

## 2015-10-11 LAB — CULTURE, RESPIRATORY W GRAM STAIN: Culture: NORMAL

## 2015-10-11 LAB — LEGIONELLA ANTIGEN, URINE

## 2015-10-11 LAB — PROCALCITONIN: Procalcitonin: 0.18 ng/mL

## 2015-10-11 LAB — CULTURE, RESPIRATORY

## 2015-10-11 MED ORDER — FUROSEMIDE 40 MG PO TABS
40.0000 mg | ORAL_TABLET | Freq: Every day | ORAL | Status: DC
  Administered 2015-10-12: 40 mg via ORAL
  Filled 2015-10-11: qty 1

## 2015-10-11 MED ORDER — PREDNISONE 20 MG PO TABS
40.0000 mg | ORAL_TABLET | Freq: Every day | ORAL | Status: DC
  Filled 2015-10-11: qty 2

## 2015-10-11 NOTE — Progress Notes (Signed)
[facility]                                                                              Patient Demographics  Renee [name], is a 82 y.o. female, DOB - [birth year], ID:[id number]  Admit date - 10/07/2015   Admitting Physician Ripudeep Krystal Eaton, MD  Outpatient Primary MD for the patient is [name], MD  LOS - 4   Chief Complaint  Patient presents with  . Shortness of Breath       Brief HPI   Patient is a 82 year old female with diabetes mellitus, CVA, hypertension, chronic kidney disease stage III presented to ED with worsening shortness of breath and difficulty breathing for last 5 days. Patient saw her PCP on 2/14 and was prescribed Z-Pak, Tessalon Perles and albuterol inhaler. Patient reported that her symptoms continued to get worse, she had coughing with yellowish greenish phlegm and pleuritic chest pain with coughing with subjective fevers and chills.ED course: BMET showed bicarbonate of 19, creatinine 1.8, baseline 1.3-1.4, glucose 512, anion gap 13. CBC showed WBC count of 11.1, hemoglobin 10.2, hematocrit 32.6, BNP 186.5 Chest x-ray showed right upper lobe pneumonia, lung base opacity could reflect additional pneumonia, no pulmonary edema.   Assessment & Plan    Principal Problem:  Acute respiratory failure ([place]) with HCAP (healthcare-associated pneumonia): Improving, slowly -Pro-calcitonin 0.65, blood cultures negative, urine legionella antigen, urine strep antigen negative, flu negative   - Placed on IV vancomycin, IV cefepime for HCAP, day #5 of IV vancomycin, will discontinue, transition to oral antibiotics in a.m.  - Placed on scheduled bronchodilators duonebs, Pulmicort, flutter valve, Mucinex, Tessalon Perles - Transitioned to oral prednisone - Swallow evaluation -> no aspiration - Home O2 evaluation prior to discharge - Chest x-ray today shows cardiomegaly with vascular congestion -> Lasix 40mg  x2 days, reassess in a.m. patient is not on Lasix  outpatient   Active Problems:  Diabetes type 2, uncontrolled ([place]) with hyperosmolar syndrome, anion gap 13 - on resistance sliding scale insulin (due to steroid use), meal coverage and Lantus - Increased Lantus to 15 units twice a day  -  hemoglobin A1c 7.1 - IV Solu-Medrol discontinued, placed on oral prednisone, hopefully CBGs will improve   Essential hypertension, currently borderline - continue Toprol-XL, amlodipine   Anxiety -Continue Xanax, Remeron, Zoloft  Hyperlipidemia - Continue Lopid   Acute on chronic kidney disease stage III: Likely worsened due to #1,  - creatinine 1.82 at the time of admission, baseline 1.3-1.4 - Improving, creatinine down to 1.5  Code Status: Full CODE STATUS  Family Communication: Discussed in detail with the patient, all imaging results, lab results explained to the patient    Disposition Plan: Hopefully DC home in 24-48 hours. Patient refused to skilled nursing facility as recommended by PT evaluation.   Time Spent in minutes  25 minutes  Procedures  None  Consults   None  DVT Prophylaxis  heparin   Medications  Scheduled Meds: . amLODipine  5 mg Oral Daily  . antiseptic oral rinse  7 mL Mouth Rinse BID  . arformoterol  15 mcg Nebulization BID  . aspirin  325 mg  Oral Daily  . benzonatate  100 mg Oral TID  . budesonide (PULMICORT) nebulizer solution  0.25 mg Nebulization BID  . calcium-vitamin D  1 tablet Oral Daily  . ceFEPime (MAXIPIME) IV  1 g Intravenous Q24H  . cholecalciferol  1,000 Units Oral Daily  . ferrous sulfate  325 mg Oral BID  . gemfibrozil  600 mg Oral Daily  . guaiFENesin  1,200 mg Oral BID  . heparin  5,000 Units Subcutaneous 3 times per day  . insulin aspart  0-20 Units Subcutaneous TID WC  . insulin aspart  0-5 Units Subcutaneous QHS  . insulin aspart  5 Units Subcutaneous TID WC  . insulin glargine  15 Units Subcutaneous BID  . ipratropium-albuterol  3 mL Nebulization QID  . methylPREDNISolone  (SOLU-MEDROL) injection  60 mg Intravenous Q12H  . metoprolol succinate  12.5 mg Oral BID  . mirtazapine  7.5 mg Oral QHS  . multivitamin with minerals  1 tablet Oral Daily  . sertraline  50 mg Oral Daily   Continuous Infusions:  PRN Meds:.ALPRAZolam, docusate sodium, ipratropium-albuterol   Antibiotics   Anti-infectives    Start     Dose/Rate Route Frequency Ordered Stop   10/08/15 2100  vancomycin (VANCOCIN) IVPB 750 mg/150 ml premix  Status:  Discontinued     750 mg 150 mL/hr over 60 Minutes Intravenous Every 24 hours 10/07/15 2045 10/11/15 1410   10/08/15 1100  ceFEPIme (MAXIPIME) 1 g in dextrose 5 % 50 mL IVPB     1 g 100 mL/hr over 30 Minutes Intravenous Every 24 hours 10/07/15 2046 10/15/15 1059   10/07/15 2130  vancomycin (VANCOCIN) 1,500 mg in sodium chloride 0.9 % 500 mL IVPB     1,500 mg 250 mL/hr over 120 Minutes Intravenous  Once 10/07/15 2045 10/08/15 0056   10/07/15 2045  ceFEPIme (MAXIPIME) 1 g in dextrose 5 % 50 mL IVPB  Status:  Discontinued     1 g 100 mL/hr over 30 Minutes Intravenous 3 times per day 10/07/15 2030 10/07/15 2046   10/07/15 1830  oseltamivir (TAMIFLU) capsule 30 mg  Status:  Discontinued     30 mg Oral Daily 10/07/15 1819 10/08/15 0619   10/07/15 1745  oseltamivir (TAMIFLU) capsule 75 mg  Status:  Discontinued     75 mg Oral 2 times daily 10/07/15 1736 10/07/15 1818   10/07/15 1630  levofloxacin (LEVAQUIN) IVPB 500 mg     500 mg 100 mL/hr over 60 Minutes Intravenous  Once 10/07/15 1626 10/07/15 1818        Subjective:   Renee [name] was seen and examined today.  shortness of breath is improving, no fevers or chills. Lung sounds improving. Patient denies dizziness, abdominal pain, N/V/D/C, new weakness, numbess, tingling. No acute events overnight.    Objective:   Filed Vitals:   10/10/15 2102 10/10/15 2126 10/11/15 0921 10/11/15 1241  BP:  147/57  156/74  Pulse:  77  65  Temp:  98.4 F (36.9 C)  97.9 F (36.6 C)  TempSrc:  Oral   Oral  Resp:  18  17  Height:      Weight:  80.1 kg (176 lb 9.4 oz)    SpO2: 100% 100% 98% 98%    Intake/Output Summary (Last 24 hours) at 10/11/15 1410 Last data filed at 10/11/15 1357  Gross per 24 hour  Intake   1140 ml  Output      1 ml  Net   1139 ml  Wt Readings from Last 3 Encounters:  10/10/15 80.1 kg (176 lb 9.4 oz)  10/04/15 79.742 kg (175 lb 12.8 oz)  09/27/15 82.328 kg (181 lb 8 oz)     Exam  General: Alert and oriented x 3, NAD  HEENT:    Neck:  CVS: S1 S2 clear. Regular rate and rhythm.  Respiratory: Scattered bilateral rhonchi improving   Abdomen: Soft, nontender, nondistended, + bowel sounds  Ext: no cyanosis clubbing or edema  Neuro: no new deficits  Skin: No rashes  Psych: Normal affect and demeanor, alert and oriented x3    Data Review   Micro Results Recent Results (from the past 240 hour(s))  MRSA PCR Screening     Status: None   Collection Time: 10/07/15  8:35 PM  Result Value Ref Range Status   MRSA by PCR NEGATIVE NEGATIVE Final    Comment:        The GeneXpert MRSA Assay (FDA approved for NASAL specimens only), is one component of a comprehensive MRSA colonization surveillance program. It is not intended to diagnose MRSA infection nor to guide or monitor treatment for MRSA infections.   Culture, blood (routine x 2) Call MD if unable to obtain prior to antibiotics being given     Status: None (Preliminary result)   Collection Time: 10/07/15  9:00 PM  Result Value Ref Range Status   Specimen Description BLOOD LEFT ANTECUBITAL  Final   Special Requests BOTTLES DRAWN AEROBIC ONLY 10CC  Final   Culture NO GROWTH 4 DAYS  Final   Report Status PENDING  Incomplete  Culture, blood (routine x 2) Call MD if unable to obtain prior to antibiotics being given     Status: None (Preliminary result)   Collection Time: 10/07/15  9:05 PM  Result Value Ref Range Status   Specimen Description BLOOD LEFT HAND  Final   Special Requests  IN PEDIATRIC BOTTLE 1.5CC  Final   Culture NO GROWTH 4 DAYS  Final   Report Status PENDING  Incomplete  Culture, respiratory (NON-Expectorated)     Status: None   Collection Time: 10/08/15  5:28 AM  Result Value Ref Range Status   Specimen Description SPUTUM  Final   Special Requests NONE  Final   Gram Stain   Final    ABUNDANT WBC PRESENT,BOTH PMN AND MONONUCLEAR FEW SQUAMOUS EPITHELIAL CELLS PRESENT FEW GRAM POSITIVE COCCI IN CLUSTERS FEW GRAM POSITIVE COCCI IN PAIRS Performed at [facility]    Culture   Final    NORMAL OROPHARYNGEAL FLORA Performed at [facility]    Report Status 10/11/2015 FINAL  Final    Radiology Reports Dg Chest 2 View  10/11/2015  CLINICAL DATA:  Shortness of breath, cough, fever EXAM: CHEST  2 VIEW COMPARISON:  10/07/2015 FINDINGS: Cardiomegaly with vascular congestion. No confluent airspace opacities, effusions or overt edema. No acute bony abnormality. IMPRESSION: Cardiomegaly with vascular congestion. Electronically Signed   By: Rolm Baptise M.D.   On: 10/11/2015 07:48   Dg Chest 2 View  10/07/2015  CLINICAL DATA:  Cough, congestion, chills x 5 days. Increased resp effort. ?fever. N/v. Non smoker. Hx stroke. ?hx pna. No chest pain. EXAM: CHEST  2 VIEW COMPARISON:  09/04/2012 FINDINGS: There is patchy airspace opacity in the central right upper lobe. There is mild lung base opacity accentuated by low lung volumes, likely atelectasis. No pulmonary edema convincing pleural effusion pneumothorax. Cardiac silhouette is mildly enlarged. Mediastinal hilar masses or evidence adenopathy. Bony thorax is demineralized  but grossly intact. IMPRESSION: 1. Right upper lobe pneumonia. 2. Lung base opacity could reflect additional pneumonia but is more likely atelectasis. No pulmonary edema. 3. Mild cardiomegaly. Electronically Signed   By: Lajean Manes M.D.   On: 10/07/2015 16:12    CBC  Recent Labs Lab 10/07/15 1545 10/08/15 0224 10/09/15 0231  10/10/15 0710 10/11/15 0520  WBC 11.1* 7.9 14.8* 12.4* 12.1*  HGB 10.2* 9.8* 9.9* 9.8* 9.9*  HCT 32.6* 31.6* 31.7* 30.0* 31.9*  PLT 185 186 257 262 285  MCV 86.7 86.6 86.1 87.2 86.7  MCH 27.1 26.8 26.9 28.5 26.9  MCHC 31.3 31.0 31.2 32.7 31.0  RDW 13.9 13.9 13.9 14.0 13.8  LYMPHSABS 1.3  --   --   --   --   MONOABS 0.2  --   --   --   --   EOSABS 0.0  --   --   --   --   BASOSABS 0.0  --   --   --   --     Chemistries   Recent Labs Lab 10/07/15 1545 10/08/15 0224 10/09/15 0231 10/10/15 0710 10/11/15 0520  NA 137 140 140 139 139  K 3.5 3.7 4.2 4.4 5.0  CL 105 109 109 108 108  CO2 19* 21* 22 22 23   GLUCOSE 512* 375* 244* 262* 211*  BUN 22* 27* 39* 51* 50*  CREATININE 1.82* 1.66* 1.55* 1.51* 1.56*  CALCIUM 8.3* 8.6* 9.0 8.7* 8.5*   ------------------------------------------------------------------------------------------------------------------ estimated creatinine clearance is 26.3 mL/min (by C-G formula based on Cr of 1.56). ------------------------------------------------------------------------------------------------------------------ No results for input(s): HGBA1C in the last 72 hours. ------------------------------------------------------------------------------------------------------------------ No results for input(s): CHOL, HDL, LDLCALC, TRIG, CHOLHDL, LDLDIRECT in the last 72 hours. ------------------------------------------------------------------------------------------------------------------ No results for input(s): TSH, T4TOTAL, T3FREE, THYROIDAB in the last 72 hours.  Invalid input(s): FREET3 ------------------------------------------------------------------------------------------------------------------ No results for input(s): VITAMINB12, FOLATE, FERRITIN, TIBC, IRON, RETICCTPCT in the last 72 hours.  Coagulation profile No results for input(s): INR, PROTIME in the last 168 hours.  No results for input(s): DDIMER in the last 72 hours.  Cardiac  Enzymes No results for input(s): CKMB, TROPONINI, MYOGLOBIN in the last 168 hours.  Invalid input(s): CK ------------------------------------------------------------------------------------------------------------------ Invalid input(s): [place]  10/10/15 0721 10/10/15 1121 10/10/15 1605 10/10/15 2122 10/11/15 0826 10/11/15 1247  GLUCAP 226* 363* 172* 88 186* 6*     RAI,RIPUDEEP M.D. [facility] 10/11/2015, 2:10 PM  Pager: [phone] Between 7am to 7pm - call Pager - [phone]  After 7pm go to [email or web address] - password [email or web address]  Call night coverage person covering after 7pm

## 2015-10-12 DIAGNOSIS — N183 Chronic kidney disease, stage 3 unspecified: Secondary | ICD-10-CM | POA: Diagnosis present

## 2015-10-12 DIAGNOSIS — N1832 Chronic kidney disease, stage 3b: Secondary | ICD-10-CM | POA: Diagnosis present

## 2015-10-12 DIAGNOSIS — E870 Hyperosmolality and hypernatremia: Secondary | ICD-10-CM

## 2015-10-12 DIAGNOSIS — J181 Lobar pneumonia, unspecified organism: Principal | ICD-10-CM | POA: Insufficient documentation

## 2015-10-12 DIAGNOSIS — R739 Hyperglycemia, unspecified: Secondary | ICD-10-CM

## 2015-10-12 DIAGNOSIS — N179 Acute kidney failure, unspecified: Secondary | ICD-10-CM | POA: Diagnosis present

## 2015-10-12 DIAGNOSIS — J9601 Acute respiratory failure with hypoxia: Secondary | ICD-10-CM

## 2015-10-12 LAB — GLUCOSE, CAPILLARY
Glucose-Capillary: 47 mg/dL — ABNORMAL LOW (ref 65–99)
Glucose-Capillary: 50 mg/dL — ABNORMAL LOW (ref 65–99)
Glucose-Capillary: 74 mg/dL (ref 65–99)
Glucose-Capillary: 193 mg/dL — ABNORMAL HIGH (ref 65–99)

## 2015-10-12 LAB — CULTURE, BLOOD (ROUTINE X 2)
Culture: NO GROWTH
Culture: NO GROWTH

## 2015-10-12 MED ORDER — INSULIN GLARGINE 100 UNIT/ML ~~LOC~~ SOLN
28.0000 [IU] | Freq: Every day | SUBCUTANEOUS | Status: DC
  Filled 2015-10-12: qty 0.28

## 2015-10-12 MED ORDER — ALBUTEROL SULFATE HFA 108 (90 BASE) MCG/ACT IN AERS: 2.0000 | INHALATION_SPRAY | Freq: Four times a day (QID) | RESPIRATORY_TRACT | Status: DC | PRN

## 2015-10-12 MED ORDER — DEXTROSE 50 % IV SOLN
INTRAVENOUS | Status: AC
  Administered 2015-10-12: 50 mL
  Filled 2015-10-12: qty 50

## 2015-10-12 NOTE — Progress Notes (Signed)
Renee [name] to be D/C'd Home per MD order.  Discussed prescriptions and follow up appointments with the patient. Prescriptions given to patient, medication list explained in detail. Pt verbalized understanding.    Medication List    STOP taking these medications        azithromycin 250 MG tablet  Commonly known as:  ZITHROMAX     glipiZIDE 2.5 MG 24 hr tablet  Commonly known as:  GLUCOTROL XL     trandolapril 4 MG tablet  Commonly known as:  MAVIK      TAKE these medications        acetaminophen 500 MG tablet  Commonly known as:  TYLENOL  Take 500 mg by mouth 2 (two) times daily.     albuterol 108 (90 Base) MCG/ACT inhaler  Commonly known as:  PROVENTIL HFA;VENTOLIN HFA  Inhale 2 puffs into the lungs every 6 (six) hours as needed for wheezing or shortness of breath.     ALPRAZolam 0.5 MG tablet  Commonly known as:  XANAX  TAKE 1 TABLET BY MOUTH TWICE A DAY AS NEEDED FOR ANXIETY (SEDATION WARNING)     amLODipine 10 MG tablet  Commonly known as:  NORVASC  Take 1 tablet (10 mg total) by mouth daily.     aspirin 325 MG tablet  Take 325 mg by mouth daily.     benzonatate 200 MG capsule  Commonly known as:  TESSALON  Take 1 capsule (200 mg total) by mouth 3 (three) times daily as needed.  Notes to Patient:  This medicine is for cough     beta carotene w/minerals tablet  Take 1 tablet by mouth at bedtime.     calcium-vitamin D 500-200 MG-UNIT tablet  Commonly known as:  OSCAL WITH D  Take 1 tablet by mouth daily.     cyanocobalamin 1000 MCG/ML injection  Commonly known as:  (VITAMIN B-12)  Inject 1,000 mcg into the muscle every 30 (thirty) days.     docusate sodium 100 MG capsule  Commonly known as:  COLACE  Take 200 mg by mouth at bedtime.     ferrous sulfate 325 (65 FE) MG tablet  Take 325 mg by mouth 2 (two) times daily.     gemfibrozil 600 MG tablet  Commonly known as:  LOPID  TAKE 1 TABLET EVERY DAY     guaiFENesin 100 MG/5ML Soln  Commonly known as:   ROBITUSSIN  Take 5 mLs by mouth every 4 (four) hours as needed for cough or to loosen phlegm.     hydrALAZINE 100 MG tablet  Commonly known as:  APRESOLINE  Take 100 mg by mouth 3 (three) times daily.     metoprolol succinate 25 MG 24 hr tablet  Commonly known as:  TOPROL-XL  Take 0.5 tablets (12.5 mg total) by mouth 2 (two) times daily.     mirtazapine 15 MG tablet  Commonly known as:  REMERON  TAKE 1/2 TABLET AT BEDTIME     multivitamin capsule  Take 1 capsule by mouth daily.     sertraline 50 MG tablet  Commonly known as:  ZOLOFT  Take 1 tablet (50 mg total) by mouth daily.     Vitamin D 1000 units capsule  Take 1,000 Units by mouth daily.        Filed Vitals:   10/12/15 0605 10/12/15 0750  BP: 150/59 158/55  Pulse: 56 55  Temp: 97.4 F (36.3 C) 97.6 F (36.4 C)  Resp: 19 18  Skin clean, dry and intact without evidence of skin break down, no evidence of skin tears noted. IV catheter discontinued intact. Site without signs and symptoms of complications. Dressing and pressure applied. Pt denies pain at this time. No complaints noted.  An After Visit Summary was printed and given to the patient. Patient escorted via [place], and D/C home via private auto.  Rhemi Balbach A 10/12/2015 3:02 PM

## 2015-10-12 NOTE — Discharge Instructions (Signed)
Blood Glucose Monitoring, Adult ° °Monitoring your blood glucose (also know as blood sugar) helps you to manage your diabetes. It also helps you and your health care provider monitor your diabetes and determine how well your treatment plan is working. °WHY SHOULD YOU MONITOR YOUR BLOOD GLUCOSE? °· It can help you understand how food, exercise, and medicine affect your blood glucose. °· It allows you to know what your blood glucose is at any given moment. You can quickly tell if you are having low blood glucose (hypoglycemia) or high blood glucose (hyperglycemia). °· It can help you and your health care provider know how to adjust your medicines. °· It can help you understand how to manage an illness or adjust medicine for exercise. °WHEN SHOULD YOU TEST? °Your health care provider will help you decide how often you should check your blood glucose. This may depend on the type of diabetes you have, your diabetes control, or the types of medicines you are taking. Be sure to write down all of your blood glucose readings so that this information can be reviewed with your health care provider. See below for examples of testing times that your health care provider may suggest. °Type 1 Diabetes °· Test at least 2 times per day if your diabetes is well controlled, if you are using an insulin pump, or if you perform multiple daily injections. °· If your diabetes is not well controlled or if you are sick, you may need to test more often. °· It is a good idea to also test: °¨ Before every insulin injection. °¨ Before and after exercise. °¨ Between meals and 2 hours after a meal. °¨ Occasionally between 2:00 a.m. and 3:00 a.m. °Type 2 Diabetes °· If you are taking insulin, test at least 2 times per day. However, it is best to test before every insulin injection. °· If you take medicines by mouth (orally), test 2 times a day. °· If you are on a controlled diet, test once a day. °· If your diabetes is not well controlled or if you  are sick, you may need to monitor more often. °HOW TO MONITOR YOUR BLOOD GLUCOSE °Supplies Needed °· Blood glucose meter. °· Test strips for your meter. Each meter has its own strips. You must use the strips that go with your own meter. °· A pricking needle (lancet). °· A device that holds the lancet (lancing device). °· A journal or log book to write down your results. °Procedure °· Wash your hands with soap and water. Alcohol is not preferred. °· Prick the side of your finger (not the tip) with the lancet. °· Gently milk the finger until a small drop of blood appears. °· Follow the instructions that come with your meter for inserting the test strip, applying blood to the strip, and using your blood glucose meter. °Other Areas to Get Blood for Testing °Some meters allow you to use other areas of your body (other than your finger) to test your blood. These areas are called alternative sites. The most common alternative sites are: °· The forearm. °· The thigh. °· The back area of the lower leg. °· The palm of the hand. °The blood flow in these areas is slower. Therefore, the blood glucose values you get may be delayed, and the numbers are different from what you would get from your fingers. Do not use alternative sites if you think you are having hypoglycemia. Your reading will not be accurate. Always use a finger if you   are having hypoglycemia. Also, if you cannot feel your lows (hypoglycemia unawareness), always use your fingers for your blood glucose checks. °ADDITIONAL TIPS FOR GLUCOSE MONITORING °· Do not reuse lancets. °· Always carry your supplies with you. °· All blood glucose meters have a 24-hour "hotline" number to call if you have questions or need help. °· Adjust (calibrate) your blood glucose meter with a control solution after finishing a few boxes of strips. °BLOOD GLUCOSE RECORD KEEPING °It is a good idea to keep a daily record or log of your blood glucose readings. Most glucose meters, if not all,  keep your glucose records stored in the meter. Some meters come with the ability to download your records to your home computer. Keeping a record of your blood glucose readings is especially helpful if you are wanting to look for patterns. Make notes to go along with the blood glucose readings because you might forget what happened at that exact time. Keeping good records helps you and your health care provider to work together to achieve good diabetes management.  °  °This information is not intended to replace advice given to you by your health care provider. Make sure you discuss any questions you have with your health care provider. °  °Document Released: 08/09/2003 Document Revised: 08/27/2014 Document Reviewed: 12/29/2012 °Elsevier Interactive Patient Education ©2016 [employer] ° °

## 2015-10-12 NOTE — Progress Notes (Signed)
SATURATION QUALIFICATIONS: (This note is used to comply with regulatory documentation for home oxygen)  Patient Saturations on Room Air at Rest =99  Patient Saturations on Room Air while Ambulating = 96  Please briefly explain why patient needs home oxygen Does NOT qualify, no need for home oxygen

## 2015-10-12 NOTE — Discharge Summary (Addendum)
Physician Discharge Summary  Renee [name] Renee [id number] DOB: [birth year] DOA: 10/07/2015  PCP: Renee Pardon, MD  Admit date: 10/07/2015 Discharge date: 10/12/2015  Time spent: 35 minutes  Recommendations for Outpatient Follow-up:  1. Discharge home with outpatient PCP follow-up in 1 week. 2. Please check renal function during outpatient follow-up and if stable may resume her ACE inhibitor. 3. Given her fluctuating blood glucose I have not started her on any diabetic medications. Patient has been instructed to check her CBG at least twice a day and keep a log of it and shortly to her PCP during outpatient follow-up. 4. Please check follow-up chest x-ray in 4 weeks to ensure resolution.   Discharge Diagnoses:  Principal Problem:   Acute respiratory failure with hypoxia (HCC)   Active Problems:   Diabetes type 2, uncontrolled   Essential hypertension   Sleep apnea   CAP (community acquired pneumonia)   HCAP (healthcare-associated pneumonia)   Hyperosmolar syndrome   Acute renal failure superimposed on stage 3 chronic kidney disease ([place])   Discharge Condition: Fair  Diet recommendation: Diabetic/heart healthy  Code status: full code  [facility] Weights   10/07/15 2040 10/10/15 2126  Weight: 79.425 kg (175 lb 1.6 oz) 80.1 kg (176 lb 9.4 oz)    History of present illness:  Please refer to admission H&P for details, in brief,82 year old female with diabetes mellitus, CVA, hypertension, chronic kidney disease stage III presented to ED with worsening shortness of breath  for last 5 days. Patient saw her PCP on 2/14 and was prescribed Z-Pak, Tessalon Perles and albuterol inhaler. Patient reported that her symptoms continued to get worse, she had coughing with yellowish greenish phlegm and pleuritic chest pain with coughing along with subjective fevers and chills.  ED course: Patient was afebrile but mildly tachypneic. BMET showed bicarbonate of 19, creatinine 1.8, ( baseline 1.3-1.4,),   glucose 512, anion gap 13. CBC showed WBC count of 11.1, hemoglobin 10.2, hematocrit 32.6, BNP 186.5 Chest x-ray showed right upper lobe pneumonia, lung base opacity could reflect additional pneumonia, no pulmonary edema. Patient admitted to hospitalist service for acute respiratory failure associated with lobar pneumonia and hyperglycemia.  Hospital Course:  Principal Problem:  Acute respiratory failure ([place]) with HCAP (healthcare-associated pneumonia): Improving, slowly - blood cultures negative, urine legionella antigen, urine strep antigen negative, flu negative  - Placed on empiric IV vancomycin and cefepime HCAP, she has already received 5 days of antibiotic and since her symptoms have resolved does not need further antibiotic coverage. - Also received scheduled bronchodilators duonebs, Pulmicort, flutter valve, Mucinex, Tessalon Perles - Transitioned to oral prednisone. ( does not need it further) - Swallow evaluation done and shows no aspiration. - Patient maintaining O2 sat on room air and does not need home O2. - Repeat Chest x-ray on 2/21 showed cardiomegaly with vascular congestion and was given 40 mg Lasix 2. No further symptoms. -Patient is clinically stable to be discharged home. She was seen by physical therapy and recommended skilled nursing facility but refused. He has a caregiver who comes at home 6 days a  Week.   Active Problems:  Diabetes type 2, uncontrolled ([name]) with hyperosmolar syndrome, anion gap 13 - Was placed on Lantus and dose increased along with resistance sliding scale. - hemoglobin A1c 7.1. Patient was on glipizide as outpatient but reports that her PCP after 2 stop taking it about 2 months back. She does not check her blood sugar regularly. Also her A1c is fairly well controlled as outpatient (mostly <7  in the past 1 year). -This morning her CBG was 47, she was asymptomatic. Subsequently improved to the 70s. I think her blood sugar was elevated since  admission due to underlying infection and being on IV Solu Medrol. -Given her stable A1c and low blood glucoses this morning I will not discharge her on any insulin or oral agent. I have instructed her to check her CBG at least twice a day and keep a log of it and show it to her PCP during outpatient follow-up. -So provided instructions on any hypoglycemic episodes or symptoms.   Acute on chronic kidney disease stage III: Likely worsened due to #1,  - creatinine 1.82 at the time of admission, baseline 1.3-1.4 - Improving,  down to 1.5. Patient also received some Lasix while in the hospital. Patient is on ACE inhibitor which I would hold until renal function is normal during outpatient follow-up.   Essential hypertension, stable - continue Toprol-XL, amlodipine and hydralazine. hold ACE inhibitor given her worsened renal function.  Anxiety -Continue Xanax, Remeron, Zoloft  Hx of CVA - has mild residual right facial droop.( chronic). Continue ASA and lopid.  OSA  uses CPAP at night  Iron deficiency anemia Continue supplements.    Family Communication: Discussed in detail with the patient and her sister at bedside  Disposition Plan:  DC home . Has a caregiver at home. Patient refused to skilled nursing facility as recommended by PT evaluation.    Procedures  None  Consults  None   Discharge Exam: Filed Vitals:   10/12/15 0605 10/12/15 0750  BP: 150/59 158/55  Pulse: 56 55  Temp: 97.4 F (36.3 C) 97.6 F (36.4 C)  Resp: 19 18    General: Elderly female not in distress HEENT: No pallor, moist mucosa Chest: Clear bilaterally CVS: Normal S1 and S2, no murmurs rub or gallop GI: Soft, nondistended, nontender Scheduled: Warm, no edema CNS: Alert and oriented, nonfocal   Discharge Instructions    Current Discharge Medication List    CONTINUE these medications which have NOT CHANGED   Details  acetaminophen (TYLENOL) 500 MG tablet Take 500 mg by mouth 2 (two)  times daily.    albuterol (PROVENTIL HFA;VENTOLIN HFA) 108 (90 Base) MCG/ACT inhaler Inhale 2 puffs into the lungs every 6 (six) hours as needed for wheezing or shortness of breath. Qty: 1 Inhaler, Refills: 2   Associated Diagnoses: Shortness of breath    ALPRAZolam (XANAX) 0.5 MG tablet TAKE 1 TABLET BY MOUTH TWICE A DAY AS NEEDED FOR ANXIETY (SEDATION WARNING) Qty: 30 tablet, Refills: 3    amLODipine (NORVASC) 10 MG tablet Take 1 tablet (10 mg total) by mouth daily. Qty: 90 tablet, Refills: 3    aspirin 325 MG tablet Take 325 mg by mouth daily.     beta carotene w/minerals (OCUVITE) tablet Take 1 tablet by mouth at bedtime.    calcium-vitamin D (OSCAL WITH D) 500-200 MG-UNIT per tablet Take 1 tablet by mouth daily.     Cholecalciferol (VITAMIN D) 1000 UNITS capsule Take 1,000 Units by mouth daily.     cyanocobalamin (,VITAMIN B-12,) 1000 MCG/ML injection Inject 1,000 mcg into the muscle every 30 (thirty) days.     docusate sodium (COLACE) 100 MG capsule Take 200 mg by mouth at bedtime.    ferrous sulfate 325 (65 FE) MG tablet Take 325 mg by mouth 2 (two) times daily.    gemfibrozil (LOPID) 600 MG tablet TAKE 1 TABLET EVERY DAY Qty: 90 tablet, Refills: 3  guaiFENesin (ROBITUSSIN) 100 MG/5ML SOLN Take 5 mLs by mouth every 4 (four) hours as needed for cough or to loosen phlegm.    hydrALAZINE (APRESOLINE) 100 MG tablet Take 100 mg by mouth 3 (three) times daily.     metoprolol succinate (TOPROL-XL) 25 MG 24 hr tablet Take 0.5 tablets (12.5 mg total) by mouth 2 (two) times daily. Qty: 270 tablet, Refills: 3    mirtazapine (REMERON) 15 MG tablet TAKE 1/2 TABLET AT BEDTIME Qty: 45 tablet, Refills: 11    Multiple Vitamin (MULTIVITAMIN) capsule Take 1 capsule by mouth daily.     sertraline (ZOLOFT) 50 MG tablet Take 1 tablet (50 mg total) by mouth daily. Qty: 90 tablet, Refills: 3    benzonatate (TESSALON) 200 MG capsule Take 1 capsule (200 mg total) by mouth 3 (three) times  daily as needed. Qty: 21 capsule, Refills: 0   Associated Diagnoses: Cough      STOP taking these medications     azithromycin (ZITHROMAX) 250 MG tablet      glipiZIDE (GLUCOTROL XL) 2.5 MG 24 hr tablet      trandolapril (MAVIK) 4 MG tablet        Allergies  Allergen Reactions  . Diclofenac Sodium Other (See Comments)    REACTION: angioedema  . Pioglitazone Swelling   Follow-up Information    Follow up with Renee Pardon, MD. Schedule an appointment as soon as possible for a visit in 1 week.   Specialties:  Family Medicine, Radiology   Contact information:   [address] [place] [address] [phone]        The results of significant diagnostics from this hospitalization (including imaging, microbiology, ancillary and laboratory) are listed below for reference.    Significant Diagnostic Studies: Dg Chest 2 View  10/11/2015  CLINICAL DATA:  Shortness of breath, cough, fever EXAM: CHEST  2 VIEW COMPARISON:  10/07/2015 FINDINGS: Cardiomegaly with vascular congestion. No confluent airspace opacities, effusions or overt edema. No acute bony abnormality. IMPRESSION: Cardiomegaly with vascular congestion. Electronically Signed   By: Rolm Baptise M.D.   On: 10/11/2015 07:48   Dg Chest 2 View  10/07/2015  CLINICAL DATA:  Cough, congestion, chills x 5 days. Increased resp effort. ?fever. N/v. Non smoker. Hx stroke. ?hx pna. No chest pain. EXAM: CHEST  2 VIEW COMPARISON:  09/04/2012 FINDINGS: There is patchy airspace opacity in the central right upper lobe. There is mild lung base opacity accentuated by low lung volumes, likely atelectasis. No pulmonary edema convincing pleural effusion pneumothorax. Cardiac silhouette is mildly enlarged. Mediastinal hilar masses or evidence adenopathy. Bony thorax is demineralized but grossly intact. IMPRESSION: 1. Right upper lobe pneumonia. 2. Lung base opacity could reflect additional pneumonia but is more likely  atelectasis. No pulmonary edema. 3. Mild cardiomegaly. Electronically Signed   By: Lajean Manes M.D.   On: 10/07/2015 16:12    Microbiology: Recent Results (from the past 240 hour(s))  MRSA PCR Screening     Status: None   Collection Time: 10/07/15  8:35 PM  Result Value Ref Range Status   MRSA by PCR NEGATIVE NEGATIVE Final    Comment:        The GeneXpert MRSA Assay (FDA approved for NASAL specimens only), is one component of a comprehensive MRSA colonization surveillance program. It is not intended to diagnose MRSA infection nor to guide or monitor treatment for MRSA infections.   Culture, blood (routine x 2) Call MD if unable to obtain  prior to antibiotics being given     Status: None (Preliminary result)   Collection Time: 10/07/15  9:00 PM  Result Value Ref Range Status   Specimen Description BLOOD LEFT ANTECUBITAL  Final   Special Requests BOTTLES DRAWN AEROBIC ONLY 10CC  Final   Culture NO GROWTH 4 DAYS  Final   Report Status PENDING  Incomplete  Culture, blood (routine x 2) Call MD if unable to obtain prior to antibiotics being given     Status: None (Preliminary result)   Collection Time: 10/07/15  9:05 PM  Result Value Ref Range Status   Specimen Description BLOOD LEFT HAND  Final   Special Requests IN PEDIATRIC BOTTLE 1.5CC  Final   Culture NO GROWTH 4 DAYS  Final   Report Status PENDING  Incomplete  Culture, respiratory (NON-Expectorated)     Status: None   Collection Time: 10/08/15  5:28 AM  Result Value Ref Range Status   Specimen Description SPUTUM  Final   Special Requests NONE  Final   Gram Stain   Final    ABUNDANT WBC PRESENT,BOTH PMN AND MONONUCLEAR FEW SQUAMOUS EPITHELIAL CELLS PRESENT FEW GRAM POSITIVE COCCI IN CLUSTERS FEW GRAM POSITIVE COCCI IN PAIRS Performed at [facility]    Culture   Final    NORMAL OROPHARYNGEAL FLORA Performed at [facility]    Report Status 10/11/2015 FINAL  Final     Labs: Basic Metabolic  Panel:  Recent Labs Lab 10/07/15 1545 10/08/15 0224 10/09/15 0231 10/10/15 0710 10/11/15 0520  NA 137 140 140 139 139  K 3.5 3.7 4.2 4.4 5.0  CL 105 109 109 108 108  CO2 19* 21* 22 22 23   GLUCOSE 512* 375* 244* 262* 211*  BUN 22* 27* 39* 51* 50*  CREATININE 1.82* 1.66* 1.55* 1.51* 1.56*  CALCIUM 8.3* 8.6* 9.0 8.7* 8.5*   Liver Function Tests: No results for input(s): AST, ALT, ALKPHOS, BILITOT, PROT, ALBUMIN in the last 168 hours. No results for input(s): LIPASE, AMYLASE in the last 168 hours. No results for input(s): AMMONIA in the last 168 hours. CBC:  Recent Labs Lab 10/07/15 1545 10/08/15 0224 10/09/15 0231 10/10/15 0710 10/11/15 0520  WBC 11.1* 7.9 14.8* 12.4* 12.1*  NEUTROABS 9.6*  --   --   --   --   HGB 10.2* 9.8* 9.9* 9.8* 9.9*  HCT 32.6* 31.6* 31.7* 30.0* 31.9*  MCV 86.7 86.6 86.1 87.2 86.7  PLT 185 186 257 262 285   Cardiac Enzymes: No results for input(s): CKTOTAL, CKMB, CKMBINDEX, TROPONINI in the last 168 hours. BNP: BNP (last 3 results)  Recent Labs  10/07/15 1545 10/08/15 0801  BNP 186.5* 177.9*    ProBNP (last 3 results) No results for input(s): PROBNP in the last 8760 hours.  CBG:  Recent Labs Lab 10/11/15 1657 10/11/15 2141 10/12/15 0753 10/12/15 0755 10/12/15 0828  GLUCAP 150* 103* 47* 50* 74       Signed:  Louellen Molder MD.  [facility] 10/12/2015, 10:25 AM

## 2015-10-12 NOTE — Care Management Note (Signed)
Case Management Note  Patient Details  Name: Renee [name] MRN: [id number] Date of Birth: 18-Oct-1928  Subjective/Objective:        CM following for progression and d/c planning.            Action/Plan: No HH or DME needs identified. Per report pt has aides in the home to assist , no further needs.   Expected Discharge Date:                  Expected Discharge Plan:  Home/Self Care  In-House Referral:  NA  Discharge planning Services  NA  Post Acute Care Choice:  NA Choice offered to:  NA  DME Arranged:   NA DME Agency:   NA  HH Arranged:   NA HH Agency:  NA  Status of Service:  Completed, signed off  Medicare Important Message Given:  Yes Date Medicare IM Given:    Medicare IM give by:    Date Additional Medicare IM Given:    Additional Medicare Important Message give by:     If discussed at [place] of Stay Meetings, dates discussed:    Additional Comments:  Adron Bene, RN 10/12/2015, 3:19 PM

## 2015-10-13 ENCOUNTER — Telehealth: Payer: Self-pay

## 2015-10-13 NOTE — Telephone Encounter (Signed)
Initial TCM call - discharged 10/12/15  Transition Care Management Follow-up Telephone Call     Date discharged? 10/12/15   How have you been since you were released from the hospital? Generalized weakness   Do you understand why you were in the hospital? Yes   Do you understand the discharge instructions? Yes   Where were you discharged to? Home; has caregiver   Items Reviewed:  Medications reviewed: Yes  Allergies reviewed: Yes  Dietary changes reviewed: Yes  Referrals reviewed: N/A   Functional Questionnaire:   Activities of Daily Living (ADLs):   Patient is independent with the following ADLs: eating (self-feeds)  Patient requires assistance with the following ADLs: personal hygiene, transfers, ambulation (uses assistive devices)   Any transportation issues/concerns?: NO   Any patient concerns? NO   Confirmed importance and date/time of follow-up visits scheduled YES  Provider Appointment booked with Dr. Glori Bickers on 10/19/15 @ 11:30AM  Confirmed with patient if condition begins to worsen call PCP or go to the ER.  Patient was given the office number and encouraged to call back with question or concerns.  : YES

## 2015-10-19 ENCOUNTER — Ambulatory Visit (INDEPENDENT_AMBULATORY_CARE_PROVIDER_SITE_OTHER): Payer: Medicare Other | Admitting: Family Medicine

## 2015-10-19 ENCOUNTER — Encounter: Payer: Self-pay | Admitting: Family Medicine

## 2015-10-19 VITALS — BP 146/70 | HR 68 | Temp 97.7°F | Ht 62.25 in | Wt 175.2 lb

## 2015-10-19 DIAGNOSIS — I10 Essential (primary) hypertension: Secondary | ICD-10-CM | POA: Diagnosis not present

## 2015-10-19 DIAGNOSIS — J189 Pneumonia, unspecified organism: Secondary | ICD-10-CM

## 2015-10-19 DIAGNOSIS — J9601 Acute respiratory failure with hypoxia: Secondary | ICD-10-CM

## 2015-10-19 DIAGNOSIS — N183 Chronic kidney disease, stage 3 unspecified: Secondary | ICD-10-CM

## 2015-10-19 DIAGNOSIS — E538 Deficiency of other specified B group vitamins: Secondary | ICD-10-CM

## 2015-10-19 DIAGNOSIS — N179 Acute kidney failure, unspecified: Secondary | ICD-10-CM

## 2015-10-19 DIAGNOSIS — E1165 Type 2 diabetes mellitus with hyperglycemia: Secondary | ICD-10-CM

## 2015-10-19 LAB — COMPREHENSIVE METABOLIC PANEL
ALT: 31 U/L (ref 0–35)
AST: 18 U/L (ref 0–37)
Albumin: 3.6 g/dL (ref 3.5–5.2)
Alkaline Phosphatase: 73 U/L (ref 39–117)
BUN: 26 mg/dL — ABNORMAL HIGH (ref 6–23)
CO2: 26 mEq/L (ref 19–32)
Calcium: 9.1 mg/dL (ref 8.4–10.5)
Chloride: 105 mEq/L (ref 96–112)
Creatinine, Ser: 1.15 mg/dL (ref 0.40–1.20)
GFR: 47.71 mL/min — ABNORMAL LOW (ref >60.00)
Glucose, Bld: 271 mg/dL — ABNORMAL HIGH (ref 70–99)
Potassium: 4.4 mEq/L (ref 3.5–5.1)
Sodium: 137 mEq/L (ref 135–145)
Total Bilirubin: 0.3 mg/dL (ref 0.2–1.2)
Total Protein: 5.9 g/dL — ABNORMAL LOW (ref 6.0–8.3)

## 2015-10-19 LAB — CBC WITH DIFFERENTIAL/PLATELET
Basophils Absolute: 0 10*3/uL (ref 0.0–0.1)
Basophils Relative: 0.5 % (ref 0.0–3.0)
Eosinophils Absolute: 0.1 10*3/uL (ref 0.0–0.7)
Eosinophils Relative: 1.2 % (ref 0.0–5.0)
HCT: 31.6 % — ABNORMAL LOW (ref 36.0–46.0)
Hemoglobin: 10.4 g/dL — ABNORMAL LOW (ref 12.0–15.0)
Lymphocytes Relative: 12.4 % (ref 12.0–46.0)
Lymphs Abs: 1 10*3/uL (ref 0.7–4.0)
MCHC: 33 g/dL (ref 30.0–36.0)
MCV: 84.3 fl (ref 78.0–100.0)
Monocytes Absolute: 0.5 10*3/uL (ref 0.1–1.0)
Monocytes Relative: 6.1 % (ref 3.0–12.0)
Neutro Abs: 6.5 10*3/uL (ref 1.4–7.7)
Neutrophils Relative %: 79.8 % — ABNORMAL HIGH (ref 43.0–77.0)
Platelets: 244 10*3/uL (ref 150.0–400.0)
RBC: 3.74 Mil/uL — ABNORMAL LOW (ref 3.87–5.11)
RDW: 14.8 % (ref 11.5–15.5)
WBC: 8.1 10*3/uL (ref 4.0–10.5)

## 2015-10-19 MED ORDER — CYANOCOBALAMIN 1000 MCG/ML IJ SOLN
1000.0000 ug | Freq: Once | INTRAMUSCULAR | Status: AC
  Administered 2015-10-19: 1000 ug via INTRAMUSCULAR

## 2015-10-19 NOTE — Patient Instructions (Addendum)
Follow up with me in about a month for a chest xray and re check Labs today  When that result returns we will be able to re start your bp medicine  B12 shot today   If symptoms return-please let me know

## 2015-10-19 NOTE — Progress Notes (Signed)
Pre visit review using our clinic review tool, if applicable. No additional management support is needed unless otherwise documented below in the visit note. 

## 2015-10-19 NOTE — Progress Notes (Signed)
Subjective:    Patient ID: Renee [name], female    DOB: [birth year], 82 y.o.   MRN: [id number]  HPI Here for f/u of hospitalization for healthcare assoc pneumonia 12/17-12/22 With corresp CKD and diabetes adn one episode of fluid overload   Had prev been seen in office for bronchitis and tx with zpak/tessalon and albuterol Symptoms worsened  Dev acute resp insuf with hypoxia and RUL infiltrate Neg BC, legionella, urine strep ag and flu tests   Was tx with 5 d of vanco and cefepimine with imp  Also solu medrol and duoneb and pulmicort for reactive airways Also mucinex/tesslaon and flutter valve   Glucose at admit was 512 due to inf- was tx with lantus and SSI in hosp-that improved and not d/c on any of that   Given one dose of lasix for fluid overload which improved   ACE held for CKD stage 3 because cr went up to 1.8   Was d/c home because she ref SNF though that was recommended  She has care giver coming in to home 6 d   BP Readings from Last 3 Encounters:  10/19/15 146/70  10/12/15 158/55  10/04/15 122/78    Pulse ox is 98% today on RA as well   She went out to get to get hair done - that means she is feeling a bit better  Still tired Breathing is much improved  Has not coughed in 2 days   Patient Active Problem List   Diagnosis Date Noted  . Acute renal failure superimposed on stage 3 chronic kidney disease ([place]) 10/12/2015  . Lobar pneumonia due to unspecified organism   . CAP (community acquired pneumonia) 10/07/2015  . HCAP (healthcare-associated pneumonia) 10/07/2015  . Hyperglycemia 10/07/2015  . Hyperosmolar syndrome 10/07/2015  . Cerumen impaction 09/27/2015  . Estrogen deficiency 08/30/2015  . Grief reaction 08/30/2015  . UTI (urinary tract infection) 05/31/2014  . Abnormal TSH 04/27/2014  . Encounter for Medicare annual wellness exam 04/17/2013  . Mobility impaired 06/26/2011  . History of retinal detachment 01/10/2011  . Sleep apnea 11/28/2010  .  Depression with anxiety 08/25/2010  . CVA WITH LEFT HEMIPARESIS 07/05/2010  . POSTHERPETIC NEURALGIA 11/09/2009  . Osteopenia 12/20/2008  . MUSCLE WEAKNESS (GENERALIZED) 08/03/2008  . Renal insufficiency 06/29/2008  . BACK PAIN 01/26/2008  . EDEMA 01/26/2008  . B12 deficiency 01/10/2007  . Diabetes type 2, controlled ([place]) 11/27/2006  . Hyperlipidemia 11/27/2006  . Essential hypertension 11/27/2006  . FIBROCYSTIC BREAST DISEASE 11/27/2006  . ROSACEA 11/27/2006  . OSTEOARTHRITIS 11/27/2006  . URINARY INCONTINENCE, MIXED 11/27/2006  . ANGIOEDEMA 11/27/2006   Past Medical History  Diagnosis Date  . Sleep apnea   . Diabetes mellitus     type II  . Hypertension   . Hyperlipidemia   . Stroke [facility]) 05/2010    Small vessel sobcortical (in [place] trial) with Dr Leonie Man, residual L hemiparesis  . Osteoarthritis   . Angioedema     possibly from voltaren  . Vitamin B 12 deficiency 04/08  . LVH (left ventricular hypertrophy)     and atrial enlargement by echo in past with nl EF  . Degenerative disc disease   . Osteopenia   . Nasal pruritis   . Renal insufficiency    Past Surgical History  Procedure Laterality Date  . Eye surgery      cataract extraction  . Abdominal hysterectomy      BSO-fibroids  . Knee surgery      arthroscope  .  Spine surgery  08/09    spinal decompression surgery  . Retinal detachment surgery  02/18/11    times 2  . Back surgery    . Colon surgery      due to punctured intestines  . Appendectomy    . Pars plana vitrectomy  07/31/2011    Procedure: PARS PLANA VITRECTOMY WITH 25 GAUGE;  Surgeon: [name], MD;  Location: [facility];  Service: Ophthalmology;  Laterality: Right;  REMOVAL OF SILICONE OIL AND LASER RIGHT EYE   Social History  Substance Use Topics  . Smoking status: Never Smoker   . Smokeless tobacco: Never Used  . Alcohol Use: No   Family History  Problem Relation Age of Onset  . Cancer Sister     brain tumor with hemmorhage  . Heart  disease Sister     CAD  . COPD Brother    Allergies  Allergen Reactions  . Diclofenac Sodium Other (See Comments)    REACTION: angioedema  . Pioglitazone Swelling   Current Outpatient Prescriptions on File Prior to Visit  Medication Sig Dispense Refill  . acetaminophen (TYLENOL) 500 MG tablet Take 500 mg by mouth 2 (two) times daily.    Marland Kitchen albuterol (PROVENTIL HFA;VENTOLIN HFA) 108 (90 Base) MCG/ACT inhaler Inhale 2 puffs into the lungs every 6 (six) hours as needed for wheezing or shortness of breath. 1 Inhaler 2  . ALPRAZolam (XANAX) 0.5 MG tablet TAKE 1 TABLET BY MOUTH TWICE A DAY AS NEEDED FOR ANXIETY (SEDATION WARNING) 30 tablet 3  . amLODipine (NORVASC) 10 MG tablet Take 1 tablet (10 mg total) by mouth daily. 90 tablet 3  . aspirin 325 MG tablet Take 325 mg by mouth daily.     . benzonatate (TESSALON) 200 MG capsule Take 1 capsule (200 mg total) by mouth 3 (three) times daily as needed. 21 capsule 0  . beta carotene w/minerals (OCUVITE) tablet Take 1 tablet by mouth at bedtime.    . calcium-vitamin D (OSCAL WITH D) 500-200 MG-UNIT per tablet Take 1 tablet by mouth daily.     . Cholecalciferol (VITAMIN D) 1000 UNITS capsule Take 1,000 Units by mouth daily.     . cyanocobalamin (,VITAMIN B-12,) 1000 MCG/ML injection Inject 1,000 mcg into the muscle every 30 (thirty) days.     Marland Kitchen docusate sodium (COLACE) 100 MG capsule Take 200 mg by mouth at bedtime.    . ferrous sulfate 325 (65 FE) MG tablet Take 325 mg by mouth 2 (two) times daily.    Marland Kitchen gemfibrozil (LOPID) 600 MG tablet TAKE 1 TABLET EVERY DAY 90 tablet 3  . guaiFENesin (ROBITUSSIN) 100 MG/5ML SOLN Take 5 mLs by mouth every 4 (four) hours as needed for cough or to loosen phlegm.    . hydrALAZINE (APRESOLINE) 100 MG tablet Take 100 mg by mouth 3 (three) times daily.     . metoprolol succinate (TOPROL-XL) 25 MG 24 hr tablet Take 0.5 tablets (12.5 mg total) by mouth 2 (two) times daily. 270 tablet 3  . mirtazapine (REMERON) 15 MG tablet  TAKE 1/2 TABLET AT BEDTIME 45 tablet 11  . Multiple Vitamin (MULTIVITAMIN) capsule Take 1 capsule by mouth daily.     . sertraline (ZOLOFT) 50 MG tablet Take 1 tablet (50 mg total) by mouth daily. 90 tablet 3   No current facility-administered medications on file prior to visit.     Review of Systems    Review of Systems  Constitutional: Negative for fever, appetite change,  and  unexpected weight change. pos for improving fatigue  Eyes: Negative for pain and visual disturbance.  Respiratory: Negative for cough and shortness of breath.   Cardiovascular: Negative for cp or palpitations    Gastrointestinal: Negative for nausea, diarrhea and constipation.  Genitourinary: Negative for urgency and frequency. neg for hematuria  Skin: Negative for pallor or rash   Neurological: Negative for weakness, light-headedness, numbness and headaches. pos for baseline hemiplegia  Hematological: Negative for adenopathy. Does not bruise/bleed easily.  Psychiatric/Behavioral: Negative for dysphoric mood. The patient is not nervous/anxious.      Objective:   Physical Exam  Constitutional: She appears well-developed and well-nourished. No distress.  obese and well appearing   HENT:  Head: Normocephalic and atraumatic.  Mouth/Throat: Oropharynx is clear and moist.  Eyes: Conjunctivae and EOM are normal. Pupils are equal, round, and reactive to light.  Neck: Normal range of motion. Neck supple. No JVD present. Carotid bruit is not present. No thyromegaly present.  Cardiovascular: Normal rate, regular rhythm, normal heart sounds and intact distal pulses.  Exam reveals no gallop.   Pulmonary/Chest: Effort normal and breath sounds normal. No respiratory distress. She has no wheezes. She has no rales. She exhibits no tenderness.  No crackles  Abdominal: Soft. Bowel sounds are normal. She exhibits no distension, no abdominal bruit and no mass. There is no tenderness.  Musculoskeletal: She exhibits no edema or  tenderness.  Lymphadenopathy:    She has no cervical adenopathy.  Neurological: She is alert. She has normal reflexes.  Baseline neuro status with hemiplegia   Skin: Skin is warm and dry. No rash noted. No pallor.  Psychiatric: She has a normal mood and affect.          Assessment & Plan:   Problem List Items Addressed This Visit      Cardiovascular and Mediastinum   Essential hypertension - Primary    bp in fair control at this time - up slt off her ace - as expected Bmp today- if renal fxn is back to baseline we will re start it  BP Readings from Last 1 Encounters:  10/19/15 146/70   No changes needed Disc lifstyle change with low sodium diet and exercise        Relevant Orders   Comprehensive metabolic panel (Completed)   CBC with Differential/Platelet (Completed)     Respiratory   RESOLVED: Acute respiratory failure with hypoxia ([place])    Rev pt's hospitalization - had HAP and now much clinical improvement       HCAP (healthcare-associated pneumonia)    Rev hosp in detail with pt and caregiver/notes/studies/labs   (s/p steroids and inhalers and 02) No evidence of aspiration  Reassuring exam  Hypoxia and cough resolved Will plan rep cxr 1 mo to seek resolution of infiltrate        Relevant Orders   CBC with Differential/Platelet (Completed)     Digestive   B12 deficiency    B12 shot today      Relevant Medications   cyanocobalamin ((VITAMIN B-12)) injection 1,000 mcg (Completed)     Endocrine   Diabetes type 2, controlled ([place])    Glucose up in hospital due to illness and also steroids (was temp on lantus and ssi)-not d/c on this  Starting to come back down  F/u planned  Lab Results  Component Value Date   HGBA1C 7.1* 10/07/2015   Has been fairly controlled         Genitourinary   Acute renal failure  superimposed on stage 3 chronic kidney disease ([place])    During hosp for HAP Improved after hydration Ace held Lab today-will re start this if  cr is back to baseline

## 2015-10-20 ENCOUNTER — Telehealth: Payer: Self-pay | Admitting: Family Medicine

## 2015-10-20 NOTE — Telephone Encounter (Signed)
Patient returned Shapale's call. °

## 2015-10-20 NOTE — Assessment & Plan Note (Signed)
During hosp for HAP Improved after hydration Ace held Lab today-will re start this if cr is back to baseline

## 2015-10-20 NOTE — Assessment & Plan Note (Signed)
Rev hosp in detail with pt and caregiver/notes/studies/labs   (s/p steroids and inhalers and 02) No evidence of aspiration  Reassuring exam  Hypoxia and cough resolved Will plan rep cxr 1 mo to seek resolution of infiltrate

## 2015-10-20 NOTE — Assessment & Plan Note (Signed)
bp in fair control at this time - up slt off her ace - as expected Bmp today- if renal fxn is back to baseline we will re start it  BP Readings from Last 1 Encounters:  10/19/15 146/70   No changes needed Disc lifstyle change with low sodium diet and exercise

## 2015-10-20 NOTE — Assessment & Plan Note (Signed)
Rev pt's hospitalization - had HAP and now much clinical improvement

## 2015-10-20 NOTE — Telephone Encounter (Signed)
Addressed through results notes  

## 2015-10-20 NOTE — Assessment & Plan Note (Signed)
B12 shot today. 

## 2015-10-20 NOTE — Assessment & Plan Note (Signed)
Glucose up in hospital due to illness and also steroids (was temp on lantus and ssi)-not d/c on this  Starting to come back down  F/u planned  Lab Results  Component Value Date   HGBA1C 7.1* 10/07/2015   Has been fairly controlled

## 2015-10-21 ENCOUNTER — Telehealth: Payer: Self-pay

## 2015-10-21 DIAGNOSIS — R531 Weakness: Secondary | ICD-10-CM | POA: Insufficient documentation

## 2015-10-21 DIAGNOSIS — J189 Pneumonia, unspecified organism: Secondary | ICD-10-CM

## 2015-10-21 DIAGNOSIS — I69959 Hemiplegia and hemiparesis following unspecified cerebrovascular disease affecting unspecified side: Secondary | ICD-10-CM

## 2015-10-21 NOTE — Telephone Encounter (Signed)
Trooper and she advise me that her company doesn't do home health PT we would have to put an order in [facility] for the home health PT, she was just calling to let us know that pt does need the order for the home health PT

## 2015-10-21 NOTE — Telephone Encounter (Signed)
Don Perking nurse with [facility] left v/m requesting verbal order for [place] home health PT to assist pt with weakness. Pt is agreeable. [place] request cb.

## 2015-10-21 NOTE — Telephone Encounter (Signed)
Done -will route to [facility]

## 2015-10-21 NOTE — Telephone Encounter (Signed)
Please ok that verbal order  

## 2015-10-26 DIAGNOSIS — M1990 Unspecified osteoarthritis, unspecified site: Secondary | ICD-10-CM | POA: Diagnosis not present

## 2015-10-26 DIAGNOSIS — M6281 Muscle weakness (generalized): Secondary | ICD-10-CM | POA: Diagnosis not present

## 2015-10-26 DIAGNOSIS — E1122 Type 2 diabetes mellitus with diabetic chronic kidney disease: Secondary | ICD-10-CM | POA: Diagnosis not present

## 2015-10-26 DIAGNOSIS — F418 Other specified anxiety disorders: Secondary | ICD-10-CM | POA: Diagnosis not present

## 2015-10-26 DIAGNOSIS — N183 Chronic kidney disease, stage 3 (moderate): Secondary | ICD-10-CM | POA: Diagnosis not present

## 2015-10-26 DIAGNOSIS — D519 Vitamin B12 deficiency anemia, unspecified: Secondary | ICD-10-CM | POA: Diagnosis not present

## 2015-10-26 DIAGNOSIS — Z7982 Long term (current) use of aspirin: Secondary | ICD-10-CM | POA: Diagnosis not present

## 2015-10-26 DIAGNOSIS — Z8701 Personal history of pneumonia (recurrent): Secondary | ICD-10-CM | POA: Diagnosis not present

## 2015-10-26 DIAGNOSIS — G4730 Sleep apnea, unspecified: Secondary | ICD-10-CM | POA: Diagnosis not present

## 2015-10-26 DIAGNOSIS — N3946 Mixed incontinence: Secondary | ICD-10-CM | POA: Diagnosis not present

## 2015-10-26 DIAGNOSIS — I69354 Hemiplegia and hemiparesis following cerebral infarction affecting left non-dominant side: Secondary | ICD-10-CM | POA: Diagnosis not present

## 2015-10-26 DIAGNOSIS — I129 Hypertensive chronic kidney disease with stage 1 through stage 4 chronic kidney disease, or unspecified chronic kidney disease: Secondary | ICD-10-CM | POA: Diagnosis not present

## 2015-10-31 DIAGNOSIS — I69354 Hemiplegia and hemiparesis following cerebral infarction affecting left non-dominant side: Secondary | ICD-10-CM | POA: Diagnosis not present

## 2015-10-31 DIAGNOSIS — N183 Chronic kidney disease, stage 3 (moderate): Secondary | ICD-10-CM | POA: Diagnosis not present

## 2015-10-31 DIAGNOSIS — M6281 Muscle weakness (generalized): Secondary | ICD-10-CM | POA: Diagnosis not present

## 2015-10-31 DIAGNOSIS — F418 Other specified anxiety disorders: Secondary | ICD-10-CM | POA: Diagnosis not present

## 2015-10-31 DIAGNOSIS — E1122 Type 2 diabetes mellitus with diabetic chronic kidney disease: Secondary | ICD-10-CM | POA: Diagnosis not present

## 2015-10-31 DIAGNOSIS — I129 Hypertensive chronic kidney disease with stage 1 through stage 4 chronic kidney disease, or unspecified chronic kidney disease: Secondary | ICD-10-CM | POA: Diagnosis not present

## 2015-11-02 DIAGNOSIS — I69354 Hemiplegia and hemiparesis following cerebral infarction affecting left non-dominant side: Secondary | ICD-10-CM | POA: Diagnosis not present

## 2015-11-02 DIAGNOSIS — N183 Chronic kidney disease, stage 3 (moderate): Secondary | ICD-10-CM | POA: Diagnosis not present

## 2015-11-02 DIAGNOSIS — E1122 Type 2 diabetes mellitus with diabetic chronic kidney disease: Secondary | ICD-10-CM | POA: Diagnosis not present

## 2015-11-02 DIAGNOSIS — I129 Hypertensive chronic kidney disease with stage 1 through stage 4 chronic kidney disease, or unspecified chronic kidney disease: Secondary | ICD-10-CM | POA: Diagnosis not present

## 2015-11-02 DIAGNOSIS — M6281 Muscle weakness (generalized): Secondary | ICD-10-CM | POA: Diagnosis not present

## 2015-11-02 DIAGNOSIS — F418 Other specified anxiety disorders: Secondary | ICD-10-CM | POA: Diagnosis not present

## 2015-11-04 ENCOUNTER — Telehealth: Payer: Self-pay

## 2015-11-04 DIAGNOSIS — F418 Other specified anxiety disorders: Secondary | ICD-10-CM | POA: Diagnosis not present

## 2015-11-04 DIAGNOSIS — N183 Chronic kidney disease, stage 3 (moderate): Secondary | ICD-10-CM | POA: Diagnosis not present

## 2015-11-04 DIAGNOSIS — I129 Hypertensive chronic kidney disease with stage 1 through stage 4 chronic kidney disease, or unspecified chronic kidney disease: Secondary | ICD-10-CM | POA: Diagnosis not present

## 2015-11-04 DIAGNOSIS — I69354 Hemiplegia and hemiparesis following cerebral infarction affecting left non-dominant side: Secondary | ICD-10-CM | POA: Diagnosis not present

## 2015-11-04 DIAGNOSIS — E1122 Type 2 diabetes mellitus with diabetic chronic kidney disease: Secondary | ICD-10-CM | POA: Diagnosis not present

## 2015-11-04 DIAGNOSIS — M6281 Muscle weakness (generalized): Secondary | ICD-10-CM | POA: Diagnosis not present

## 2015-11-04 NOTE — Telephone Encounter (Signed)
Please ok that verbal order -thanks 

## 2015-11-04 NOTE — Telephone Encounter (Signed)
Verbal order given  

## 2015-11-04 NOTE — Telephone Encounter (Signed)
[name] PT with [facility] left v/m; pt fell on 11/03/15, no apparent injury but requesting OT home health referral.

## 2015-11-07 ENCOUNTER — Other Ambulatory Visit: Payer: Self-pay | Admitting: Family Medicine

## 2015-11-07 DIAGNOSIS — M6281 Muscle weakness (generalized): Secondary | ICD-10-CM | POA: Diagnosis not present

## 2015-11-07 DIAGNOSIS — E1122 Type 2 diabetes mellitus with diabetic chronic kidney disease: Secondary | ICD-10-CM | POA: Diagnosis not present

## 2015-11-07 DIAGNOSIS — F418 Other specified anxiety disorders: Secondary | ICD-10-CM | POA: Diagnosis not present

## 2015-11-07 DIAGNOSIS — I129 Hypertensive chronic kidney disease with stage 1 through stage 4 chronic kidney disease, or unspecified chronic kidney disease: Secondary | ICD-10-CM | POA: Diagnosis not present

## 2015-11-07 DIAGNOSIS — I69354 Hemiplegia and hemiparesis following cerebral infarction affecting left non-dominant side: Secondary | ICD-10-CM | POA: Diagnosis not present

## 2015-11-07 DIAGNOSIS — N183 Chronic kidney disease, stage 3 (moderate): Secondary | ICD-10-CM | POA: Diagnosis not present

## 2015-11-08 DIAGNOSIS — F418 Other specified anxiety disorders: Secondary | ICD-10-CM | POA: Diagnosis not present

## 2015-11-08 DIAGNOSIS — I69354 Hemiplegia and hemiparesis following cerebral infarction affecting left non-dominant side: Secondary | ICD-10-CM | POA: Diagnosis not present

## 2015-11-08 DIAGNOSIS — E1122 Type 2 diabetes mellitus with diabetic chronic kidney disease: Secondary | ICD-10-CM | POA: Diagnosis not present

## 2015-11-08 DIAGNOSIS — I129 Hypertensive chronic kidney disease with stage 1 through stage 4 chronic kidney disease, or unspecified chronic kidney disease: Secondary | ICD-10-CM | POA: Diagnosis not present

## 2015-11-08 DIAGNOSIS — M6281 Muscle weakness (generalized): Secondary | ICD-10-CM | POA: Diagnosis not present

## 2015-11-08 DIAGNOSIS — N183 Chronic kidney disease, stage 3 (moderate): Secondary | ICD-10-CM | POA: Diagnosis not present

## 2015-11-09 DIAGNOSIS — E1122 Type 2 diabetes mellitus with diabetic chronic kidney disease: Secondary | ICD-10-CM | POA: Diagnosis not present

## 2015-11-09 DIAGNOSIS — F418 Other specified anxiety disorders: Secondary | ICD-10-CM | POA: Diagnosis not present

## 2015-11-09 DIAGNOSIS — I129 Hypertensive chronic kidney disease with stage 1 through stage 4 chronic kidney disease, or unspecified chronic kidney disease: Secondary | ICD-10-CM | POA: Diagnosis not present

## 2015-11-09 DIAGNOSIS — M6281 Muscle weakness (generalized): Secondary | ICD-10-CM | POA: Diagnosis not present

## 2015-11-09 DIAGNOSIS — I69354 Hemiplegia and hemiparesis following cerebral infarction affecting left non-dominant side: Secondary | ICD-10-CM | POA: Diagnosis not present

## 2015-11-09 DIAGNOSIS — N183 Chronic kidney disease, stage 3 (moderate): Secondary | ICD-10-CM | POA: Diagnosis not present

## 2015-11-11 DIAGNOSIS — E1122 Type 2 diabetes mellitus with diabetic chronic kidney disease: Secondary | ICD-10-CM | POA: Diagnosis not present

## 2015-11-11 DIAGNOSIS — F418 Other specified anxiety disorders: Secondary | ICD-10-CM | POA: Diagnosis not present

## 2015-11-11 DIAGNOSIS — M6281 Muscle weakness (generalized): Secondary | ICD-10-CM | POA: Diagnosis not present

## 2015-11-11 DIAGNOSIS — I129 Hypertensive chronic kidney disease with stage 1 through stage 4 chronic kidney disease, or unspecified chronic kidney disease: Secondary | ICD-10-CM | POA: Diagnosis not present

## 2015-11-11 DIAGNOSIS — I69354 Hemiplegia and hemiparesis following cerebral infarction affecting left non-dominant side: Secondary | ICD-10-CM | POA: Diagnosis not present

## 2015-11-11 DIAGNOSIS — N183 Chronic kidney disease, stage 3 (moderate): Secondary | ICD-10-CM | POA: Diagnosis not present

## 2015-11-14 ENCOUNTER — Telehealth: Payer: Self-pay | Admitting: Family Medicine

## 2015-11-14 DIAGNOSIS — E1122 Type 2 diabetes mellitus with diabetic chronic kidney disease: Secondary | ICD-10-CM | POA: Diagnosis not present

## 2015-11-14 DIAGNOSIS — I69354 Hemiplegia and hemiparesis following cerebral infarction affecting left non-dominant side: Secondary | ICD-10-CM | POA: Diagnosis not present

## 2015-11-14 DIAGNOSIS — I129 Hypertensive chronic kidney disease with stage 1 through stage 4 chronic kidney disease, or unspecified chronic kidney disease: Secondary | ICD-10-CM | POA: Diagnosis not present

## 2015-11-14 DIAGNOSIS — F418 Other specified anxiety disorders: Secondary | ICD-10-CM | POA: Diagnosis not present

## 2015-11-14 DIAGNOSIS — M6281 Muscle weakness (generalized): Secondary | ICD-10-CM | POA: Diagnosis not present

## 2015-11-14 DIAGNOSIS — N183 Chronic kidney disease, stage 3 (moderate): Secondary | ICD-10-CM | POA: Diagnosis not present

## 2015-11-14 NOTE — Telephone Encounter (Signed)
[place] need verbal order for speech eval  Pt complain with difficult with swallowing and coughing with drinking

## 2015-11-14 NOTE — Telephone Encounter (Signed)
Please verbally ok that order-thanks

## 2015-11-14 NOTE — Telephone Encounter (Signed)
Left message on voicemail giving the verbal order

## 2015-11-18 DIAGNOSIS — E1122 Type 2 diabetes mellitus with diabetic chronic kidney disease: Secondary | ICD-10-CM | POA: Diagnosis not present

## 2015-11-18 DIAGNOSIS — I129 Hypertensive chronic kidney disease with stage 1 through stage 4 chronic kidney disease, or unspecified chronic kidney disease: Secondary | ICD-10-CM | POA: Diagnosis not present

## 2015-11-18 DIAGNOSIS — N183 Chronic kidney disease, stage 3 (moderate): Secondary | ICD-10-CM | POA: Diagnosis not present

## 2015-11-18 DIAGNOSIS — M6281 Muscle weakness (generalized): Secondary | ICD-10-CM | POA: Diagnosis not present

## 2015-11-18 DIAGNOSIS — I69354 Hemiplegia and hemiparesis following cerebral infarction affecting left non-dominant side: Secondary | ICD-10-CM | POA: Diagnosis not present

## 2015-11-18 DIAGNOSIS — F418 Other specified anxiety disorders: Secondary | ICD-10-CM | POA: Diagnosis not present

## 2015-11-21 ENCOUNTER — Encounter: Payer: Self-pay | Admitting: Family Medicine

## 2015-11-21 ENCOUNTER — Ambulatory Visit (INDEPENDENT_AMBULATORY_CARE_PROVIDER_SITE_OTHER): Payer: Medicare Other | Admitting: Family Medicine

## 2015-11-21 ENCOUNTER — Encounter: Payer: Self-pay | Admitting: *Deleted

## 2015-11-21 ENCOUNTER — Ambulatory Visit (INDEPENDENT_AMBULATORY_CARE_PROVIDER_SITE_OTHER)
Admission: RE | Admit: 2015-11-21 | Discharge: 2015-11-21 | Disposition: A | Discharge status: Home / Self Care | Payer: Medicare Other | Source: Ambulatory Visit | Attending: Family Medicine | Admitting: Family Medicine

## 2015-11-21 VITALS — BP 140/70 | HR 65 | Temp 97.5°F | Ht 62.25 in | Wt 181.5 lb

## 2015-11-21 DIAGNOSIS — F418 Other specified anxiety disorders: Secondary | ICD-10-CM | POA: Diagnosis not present

## 2015-11-21 DIAGNOSIS — I69354 Hemiplegia and hemiparesis following cerebral infarction affecting left non-dominant side: Secondary | ICD-10-CM | POA: Diagnosis not present

## 2015-11-21 DIAGNOSIS — I10 Essential (primary) hypertension: Secondary | ICD-10-CM

## 2015-11-21 DIAGNOSIS — M6281 Muscle weakness (generalized): Secondary | ICD-10-CM | POA: Diagnosis not present

## 2015-11-21 DIAGNOSIS — Z7409 Other reduced mobility: Secondary | ICD-10-CM | POA: Diagnosis not present

## 2015-11-21 DIAGNOSIS — N183 Chronic kidney disease, stage 3 (moderate): Secondary | ICD-10-CM | POA: Diagnosis not present

## 2015-11-21 DIAGNOSIS — Z79899 Other long term (current) drug therapy: Secondary | ICD-10-CM | POA: Diagnosis not present

## 2015-11-21 DIAGNOSIS — I129 Hypertensive chronic kidney disease with stage 1 through stage 4 chronic kidney disease, or unspecified chronic kidney disease: Secondary | ICD-10-CM | POA: Diagnosis not present

## 2015-11-21 DIAGNOSIS — J189 Pneumonia, unspecified organism: Secondary | ICD-10-CM | POA: Diagnosis not present

## 2015-11-21 DIAGNOSIS — E1122 Type 2 diabetes mellitus with diabetic chronic kidney disease: Secondary | ICD-10-CM | POA: Diagnosis not present

## 2015-11-21 DIAGNOSIS — E1165 Type 2 diabetes mellitus with hyperglycemia: Secondary | ICD-10-CM

## 2015-11-21 NOTE — Assessment & Plan Note (Signed)
Fairly stable on low dose xanax  Due for tox screen today    (thinks her last dose was this am or last night)

## 2015-11-21 NOTE — Assessment & Plan Note (Signed)
Px written to give to her OT Thomos Lemons Zyl     (if needed [phone]) For tub bench and 3 in one commode  Is mobility impaired from CVA-these would help  She is unable to rise from a chair unassisted and must use walker at all times

## 2015-11-21 NOTE — Assessment & Plan Note (Signed)
Per pt - glucose levels are normalizing F/u 3 mo with A1C prior

## 2015-11-21 NOTE — Assessment & Plan Note (Signed)
bp in fair control at this time (back to baseline) BP Readings from Last 1 Encounters:  11/21/15 140/70   No changes needed Disc lifstyle change with low sodium diet and exercise  Renal f/u later this mo F/u here 3 mo

## 2015-11-21 NOTE — Assessment & Plan Note (Signed)
Clinically improved Getting OT and PT   (also a swallowing study for cough with drinking liquids) cxr re check today (had RUQ infiltrate in hospital) Reassuring exam

## 2015-11-21 NOTE — Progress Notes (Signed)
Subjective:    Patient ID: Renee [name], female    DOB: 08/19/1929, 82 y.o.   MRN: [id number]  HPI Here for f/u of healthcare assoc pneumonia and also chronic medical problems   Was hosp 12/17-12/22 RUL infiltrate Due for re check cxr Was doing better at last visit   Breathing and cough are improved  Just a little cough occ with runny nose  Poss due to allergies  Got hearing aides - and really likes them   Getting PT/OT  Is mobility impaired  Requested tub bench and 3 in one commode by her OT as well to help  Is coughing some when she eats  More when she drinks fluids Did stop using a straw  Planning a swallowing study - is planned for tomorrow   bp is stable today  No cp or palpitations or headaches or edema  No side effects to medicines  BP Readings from Last 3 Encounters:  11/21/15 140/70  10/19/15 146/70  10/12/15 158/55     Blood sugar 140s for the most part Improved from her hospitalization   Patient Active Problem List   Diagnosis Date Noted  . Weakness 10/21/2015  . Acute renal failure superimposed on stage 3 chronic kidney disease ([place]) 10/12/2015  . Lobar pneumonia due to unspecified organism   . CAP (community acquired pneumonia) 10/07/2015  . HCAP (healthcare-associated pneumonia) 10/07/2015  . Hyperglycemia 10/07/2015  . Hyperosmolar syndrome 10/07/2015  . Cerumen impaction 09/27/2015  . Estrogen deficiency 08/30/2015  . Grief reaction 08/30/2015  . UTI (urinary tract infection) 05/31/2014  . Abnormal TSH 04/27/2014  . Encounter for Medicare annual wellness exam 04/17/2013  . Mobility impaired 06/26/2011  . History of retinal detachment 01/10/2011  . Sleep apnea 11/28/2010  . Depression with anxiety 08/25/2010  . Hemiplegia, late effect of cerebrovascular disease ([place]) 07/05/2010  . POSTHERPETIC NEURALGIA 11/09/2009  . Osteopenia 12/20/2008  . MUSCLE WEAKNESS (GENERALIZED) 08/03/2008  . Renal insufficiency 06/29/2008  . BACK PAIN  01/26/2008  . EDEMA 01/26/2008  . B12 deficiency 01/10/2007  . Diabetes type 2, controlled ([place]) 11/27/2006  . Hyperlipidemia 11/27/2006  . Essential hypertension 11/27/2006  . FIBROCYSTIC BREAST DISEASE 11/27/2006  . ROSACEA 11/27/2006  . OSTEOARTHRITIS 11/27/2006  . URINARY INCONTINENCE, MIXED 11/27/2006  . ANGIOEDEMA 11/27/2006   Past Medical History  Diagnosis Date  . Sleep apnea   . Diabetes mellitus     type II  . Hypertension   . Hyperlipidemia   . Stroke [facility]) 05/2010    Small vessel sobcortical (in [place] trial) with Dr Leonie Man, residual L hemiparesis  . Osteoarthritis   . Angioedema     possibly from voltaren  . Vitamin B 12 deficiency 04/08  . LVH (left ventricular hypertrophy)     and atrial enlargement by echo in past with nl EF  . Degenerative disc disease   . Osteopenia   . Nasal pruritis   . Renal insufficiency    Past Surgical History  Procedure Laterality Date  . Eye surgery      cataract extraction  . Abdominal hysterectomy      BSO-fibroids  . Knee surgery      arthroscope  . Spine surgery  08/09    spinal decompression surgery  . Retinal detachment surgery  02/18/11    times 2  . Back surgery    . Colon surgery      due to punctured intestines  . Appendectomy    . Pars plana vitrectomy  07/31/2011  Procedure: PARS PLANA VITRECTOMY WITH 25 GAUGE;  Surgeon: [name], MD;  Location: [facility];  Service: Ophthalmology;  Laterality: Right;  REMOVAL OF SILICONE OIL AND LASER RIGHT EYE   Social History  Substance Use Topics  . Smoking status: Never Smoker   . Smokeless tobacco: Never Used  . Alcohol Use: No   Family History  Problem Relation Age of Onset  . Cancer Sister     brain tumor with hemmorhage  . Heart disease Sister     CAD  . COPD Brother    Allergies  Allergen Reactions  . Diclofenac Sodium Other (See Comments)    REACTION: angioedema  . Pioglitazone Swelling   Current Outpatient Prescriptions on File Prior to Visit    Medication Sig Dispense Refill  . acetaminophen (TYLENOL) 500 MG tablet Take 500 mg by mouth 2 (two) times daily.    Marland Kitchen ALPRAZolam (XANAX) 0.5 MG tablet TAKE 1 TABLET BY MOUTH TWICE A DAY AS NEEDED FOR ANXIETY (SEDATION WARNING) 30 tablet 3  . amLODipine (NORVASC) 10 MG tablet Take 1 tablet (10 mg total) by mouth daily. 90 tablet 3  . aspirin 325 MG tablet Take 325 mg by mouth daily.     . beta carotene w/minerals (OCUVITE) tablet Take 1 tablet by mouth at bedtime.    . calcium-vitamin D (OSCAL WITH D) 500-200 MG-UNIT per tablet Take 1 tablet by mouth daily.     . Cholecalciferol (VITAMIN D) 1000 UNITS capsule Take 1,000 Units by mouth daily.     . cyanocobalamin (,VITAMIN B-12,) 1000 MCG/ML injection Inject 1,000 mcg into the muscle every 30 (thirty) days.     Marland Kitchen docusate sodium (COLACE) 100 MG capsule Take 200 mg by mouth at bedtime.    . ferrous sulfate 325 (65 FE) MG tablet Take 325 mg by mouth 2 (two) times daily.    Marland Kitchen gemfibrozil (LOPID) 600 MG tablet TAKE 1 TABLET EVERY DAY 90 tablet 1  . guaiFENesin (ROBITUSSIN) 100 MG/5ML SOLN Take 5 mLs by mouth every 4 (four) hours as needed for cough or to loosen phlegm.    . hydrALAZINE (APRESOLINE) 100 MG tablet Take 100 mg by mouth 3 (three) times daily.     . metoprolol succinate (TOPROL-XL) 25 MG 24 hr tablet Take 0.5 tablets (12.5 mg total) by mouth 2 (two) times daily. 270 tablet 3  . mirtazapine (REMERON) 15 MG tablet TAKE 1/2 TABLET AT BEDTIME 45 tablet 11  . Multiple Vitamin (MULTIVITAMIN) capsule Take 1 capsule by mouth daily.     . sertraline (ZOLOFT) 50 MG tablet Take 1 tablet (50 mg total) by mouth daily. 90 tablet 3   No current facility-administered medications on file prior to visit.    Review of Systems Review of Systems  Constitutional: Negative for fever, appetite change,  and unexpected weight change.  Eyes: Negative for pain and visual disturbance.  ENT pos for rhinorrhea and some pnd Respiratory: Negative for  shortness of  breath.  pos for occ cough from pnd, pos for occ cough while drinking fluids Cardiovascular: Negative for cp or palpitations    Gastrointestinal: Negative for nausea, diarrhea and constipation.  Genitourinary: Negative for urgency and frequency.  Skin: Negative for pallor or rash   Neurological: Negative for , light-headedness, numbness and headaches. pos for baseline hemiparesis and mobility impairment  Hematological: Negative for adenopathy. Does not bruise/bleed easily.  Psychiatric/Behavioral: Negative for dysphoric mood. The patient is not nervous/anxious.  Objective:   Physical Exam  Constitutional: She appears well-developed and well-nourished. No distress.  obese and well but frail appearing    HENT:  Head: Normocephalic and atraumatic.  Mouth/Throat: Oropharynx is clear and moist.  Nares are boggy No sinus tenderness  Eyes: Conjunctivae and EOM are normal. Pupils are equal, round, and reactive to light.  Neck: Normal range of motion. Neck supple. No JVD present. Carotid bruit is not present. No thyromegaly present.  Cardiovascular: Normal rate, regular rhythm, normal heart sounds and intact distal pulses.  Exam reveals no gallop.   Pulmonary/Chest: Effort normal and breath sounds normal. No respiratory distress. She has no wheezes. She has no rales. She exhibits no tenderness.  No crackles  Abdominal: Soft. Bowel sounds are normal. She exhibits no distension, no abdominal bruit and no mass. There is no tenderness.  Musculoskeletal: She exhibits no edema or tenderness.  Lymphadenopathy:    She has no cervical adenopathy.  Neurological: She is alert. She has normal reflexes.  Baseline hemiparesis  Pt cannot rise from chair w/o assistance Walks with a walker - slow gait that is wide based  Skin: Skin is warm and dry. No rash noted. No pallor.  Psychiatric: She has a normal mood and affect.          Assessment & Plan:   Problem List Items Addressed This Visit        Cardiovascular and Mediastinum   Essential hypertension    bp in fair control at this time (back to baseline) BP Readings from Last 1 Encounters:  11/21/15 140/70   No changes needed Disc lifstyle change with low sodium diet and exercise  Renal f/u later this mo F/u here 3 mo          Respiratory   HCAP (healthcare-associated pneumonia) - Primary    Clinically improved Getting OT and PT   (also a swallowing study for cough with drinking liquids) cxr re check today (had RUQ infiltrate in hospital) Reassuring exam       Relevant Orders   DG Chest 2 View     Other   Depression with anxiety    Fairly stable on low dose xanax  Due for tox screen today    (thinks her last dose was this am or last night)      Mobility impaired    Px written to give to her OT [place]     (if needed [phone]) For tub bench and 3 in one commode  Is mobility impaired from CVA-these would help  She is unable to rise from a chair unassisted and must use walker at all times

## 2015-11-21 NOTE — Progress Notes (Signed)
Pre visit review using our clinic review tool, if applicable. No additional management support is needed unless otherwise documented below in the visit note. 

## 2015-11-21 NOTE — Patient Instructions (Signed)
Go forward with swallowing study Continue your therapy Chest xray today for re check of pneumonia I wrote the px for the bench and the 3 in one commode   Update me if cough worsens

## 2015-11-22 DIAGNOSIS — E1122 Type 2 diabetes mellitus with diabetic chronic kidney disease: Secondary | ICD-10-CM | POA: Diagnosis not present

## 2015-11-22 DIAGNOSIS — F418 Other specified anxiety disorders: Secondary | ICD-10-CM | POA: Diagnosis not present

## 2015-11-22 DIAGNOSIS — I129 Hypertensive chronic kidney disease with stage 1 through stage 4 chronic kidney disease, or unspecified chronic kidney disease: Secondary | ICD-10-CM | POA: Diagnosis not present

## 2015-11-22 DIAGNOSIS — I69354 Hemiplegia and hemiparesis following cerebral infarction affecting left non-dominant side: Secondary | ICD-10-CM | POA: Diagnosis not present

## 2015-11-22 DIAGNOSIS — N183 Chronic kidney disease, stage 3 (moderate): Secondary | ICD-10-CM | POA: Diagnosis not present

## 2015-11-22 DIAGNOSIS — M6281 Muscle weakness (generalized): Secondary | ICD-10-CM | POA: Diagnosis not present

## 2015-11-24 DIAGNOSIS — I69354 Hemiplegia and hemiparesis following cerebral infarction affecting left non-dominant side: Secondary | ICD-10-CM | POA: Diagnosis not present

## 2015-11-24 DIAGNOSIS — M6281 Muscle weakness (generalized): Secondary | ICD-10-CM | POA: Diagnosis not present

## 2015-11-24 DIAGNOSIS — I129 Hypertensive chronic kidney disease with stage 1 through stage 4 chronic kidney disease, or unspecified chronic kidney disease: Secondary | ICD-10-CM | POA: Diagnosis not present

## 2015-11-24 DIAGNOSIS — E1122 Type 2 diabetes mellitus with diabetic chronic kidney disease: Secondary | ICD-10-CM | POA: Diagnosis not present

## 2015-11-24 DIAGNOSIS — N183 Chronic kidney disease, stage 3 (moderate): Secondary | ICD-10-CM | POA: Diagnosis not present

## 2015-11-24 DIAGNOSIS — F418 Other specified anxiety disorders: Secondary | ICD-10-CM | POA: Diagnosis not present

## 2015-11-28 ENCOUNTER — Telehealth: Payer: Self-pay

## 2015-11-28 DIAGNOSIS — E1122 Type 2 diabetes mellitus with diabetic chronic kidney disease: Secondary | ICD-10-CM | POA: Diagnosis not present

## 2015-11-28 DIAGNOSIS — N183 Chronic kidney disease, stage 3 (moderate): Secondary | ICD-10-CM | POA: Diagnosis not present

## 2015-11-28 DIAGNOSIS — M6281 Muscle weakness (generalized): Secondary | ICD-10-CM | POA: Diagnosis not present

## 2015-11-28 DIAGNOSIS — F418 Other specified anxiety disorders: Secondary | ICD-10-CM | POA: Diagnosis not present

## 2015-11-28 DIAGNOSIS — I69354 Hemiplegia and hemiparesis following cerebral infarction affecting left non-dominant side: Secondary | ICD-10-CM | POA: Diagnosis not present

## 2015-11-28 DIAGNOSIS — I129 Hypertensive chronic kidney disease with stage 1 through stage 4 chronic kidney disease, or unspecified chronic kidney disease: Secondary | ICD-10-CM | POA: Diagnosis not present

## 2015-11-28 NOTE — Telephone Encounter (Signed)
Approved.  

## 2015-11-28 NOTE — Telephone Encounter (Signed)
Esther OT with [facility] left v/m requesting verbal orders for extension of home health OT for 2 more visits to complete plan of care

## 2015-11-29 NOTE — Telephone Encounter (Signed)
Called Renee [name] and notified her that [name] approved the verbal order.

## 2015-11-30 DIAGNOSIS — I129 Hypertensive chronic kidney disease with stage 1 through stage 4 chronic kidney disease, or unspecified chronic kidney disease: Secondary | ICD-10-CM | POA: Diagnosis not present

## 2015-11-30 DIAGNOSIS — F418 Other specified anxiety disorders: Secondary | ICD-10-CM | POA: Diagnosis not present

## 2015-11-30 DIAGNOSIS — E1122 Type 2 diabetes mellitus with diabetic chronic kidney disease: Secondary | ICD-10-CM | POA: Diagnosis not present

## 2015-11-30 DIAGNOSIS — I69354 Hemiplegia and hemiparesis following cerebral infarction affecting left non-dominant side: Secondary | ICD-10-CM | POA: Diagnosis not present

## 2015-11-30 DIAGNOSIS — N183 Chronic kidney disease, stage 3 (moderate): Secondary | ICD-10-CM | POA: Diagnosis not present

## 2015-11-30 DIAGNOSIS — M6281 Muscle weakness (generalized): Secondary | ICD-10-CM | POA: Diagnosis not present

## 2015-12-05 DIAGNOSIS — N183 Chronic kidney disease, stage 3 (moderate): Secondary | ICD-10-CM | POA: Diagnosis not present

## 2015-12-05 DIAGNOSIS — I129 Hypertensive chronic kidney disease with stage 1 through stage 4 chronic kidney disease, or unspecified chronic kidney disease: Secondary | ICD-10-CM | POA: Diagnosis not present

## 2015-12-05 DIAGNOSIS — M6281 Muscle weakness (generalized): Secondary | ICD-10-CM | POA: Diagnosis not present

## 2015-12-05 DIAGNOSIS — F418 Other specified anxiety disorders: Secondary | ICD-10-CM | POA: Diagnosis not present

## 2015-12-05 DIAGNOSIS — I69354 Hemiplegia and hemiparesis following cerebral infarction affecting left non-dominant side: Secondary | ICD-10-CM | POA: Diagnosis not present

## 2015-12-05 DIAGNOSIS — E1122 Type 2 diabetes mellitus with diabetic chronic kidney disease: Secondary | ICD-10-CM | POA: Diagnosis not present

## 2015-12-06 ENCOUNTER — Telehealth: Payer: Self-pay

## 2015-12-06 DIAGNOSIS — F418 Other specified anxiety disorders: Secondary | ICD-10-CM | POA: Diagnosis not present

## 2015-12-06 DIAGNOSIS — I69354 Hemiplegia and hemiparesis following cerebral infarction affecting left non-dominant side: Secondary | ICD-10-CM | POA: Diagnosis not present

## 2015-12-06 DIAGNOSIS — E1122 Type 2 diabetes mellitus with diabetic chronic kidney disease: Secondary | ICD-10-CM | POA: Diagnosis not present

## 2015-12-06 DIAGNOSIS — N183 Chronic kidney disease, stage 3 (moderate): Secondary | ICD-10-CM | POA: Diagnosis not present

## 2015-12-06 DIAGNOSIS — I129 Hypertensive chronic kidney disease with stage 1 through stage 4 chronic kidney disease, or unspecified chronic kidney disease: Secondary | ICD-10-CM | POA: Diagnosis not present

## 2015-12-06 DIAGNOSIS — M6281 Muscle weakness (generalized): Secondary | ICD-10-CM | POA: Diagnosis not present

## 2015-12-06 NOTE — Telephone Encounter (Signed)
[name] PT with [facility] left v/m requesting verbal order to extend home health PT for 4 additional PT visits.

## 2015-12-06 NOTE — Telephone Encounter (Signed)
Please verbally ok that

## 2015-12-06 NOTE — Telephone Encounter (Signed)
Verbal order given to [employer]

## 2015-12-07 ENCOUNTER — Ambulatory Visit: Payer: Medicare Other | Admitting: Family Medicine

## 2015-12-07 ENCOUNTER — Encounter: Payer: Self-pay | Admitting: Family Medicine

## 2015-12-08 DIAGNOSIS — I69354 Hemiplegia and hemiparesis following cerebral infarction affecting left non-dominant side: Secondary | ICD-10-CM | POA: Diagnosis not present

## 2015-12-08 DIAGNOSIS — M6281 Muscle weakness (generalized): Secondary | ICD-10-CM | POA: Diagnosis not present

## 2015-12-08 DIAGNOSIS — F418 Other specified anxiety disorders: Secondary | ICD-10-CM | POA: Diagnosis not present

## 2015-12-08 DIAGNOSIS — E1122 Type 2 diabetes mellitus with diabetic chronic kidney disease: Secondary | ICD-10-CM | POA: Diagnosis not present

## 2015-12-08 DIAGNOSIS — I129 Hypertensive chronic kidney disease with stage 1 through stage 4 chronic kidney disease, or unspecified chronic kidney disease: Secondary | ICD-10-CM | POA: Diagnosis not present

## 2015-12-08 DIAGNOSIS — N183 Chronic kidney disease, stage 3 (moderate): Secondary | ICD-10-CM | POA: Diagnosis not present

## 2015-12-09 DIAGNOSIS — N183 Chronic kidney disease, stage 3 (moderate): Secondary | ICD-10-CM | POA: Diagnosis not present

## 2015-12-09 DIAGNOSIS — I129 Hypertensive chronic kidney disease with stage 1 through stage 4 chronic kidney disease, or unspecified chronic kidney disease: Secondary | ICD-10-CM | POA: Diagnosis not present

## 2015-12-09 DIAGNOSIS — E1122 Type 2 diabetes mellitus with diabetic chronic kidney disease: Secondary | ICD-10-CM | POA: Diagnosis not present

## 2015-12-09 DIAGNOSIS — F418 Other specified anxiety disorders: Secondary | ICD-10-CM | POA: Diagnosis not present

## 2015-12-09 DIAGNOSIS — I69354 Hemiplegia and hemiparesis following cerebral infarction affecting left non-dominant side: Secondary | ICD-10-CM | POA: Diagnosis not present

## 2015-12-09 DIAGNOSIS — M6281 Muscle weakness (generalized): Secondary | ICD-10-CM | POA: Diagnosis not present

## 2015-12-12 DIAGNOSIS — I69354 Hemiplegia and hemiparesis following cerebral infarction affecting left non-dominant side: Secondary | ICD-10-CM | POA: Diagnosis not present

## 2015-12-12 DIAGNOSIS — F418 Other specified anxiety disorders: Secondary | ICD-10-CM | POA: Diagnosis not present

## 2015-12-12 DIAGNOSIS — M6281 Muscle weakness (generalized): Secondary | ICD-10-CM | POA: Diagnosis not present

## 2015-12-12 DIAGNOSIS — N183 Chronic kidney disease, stage 3 (moderate): Secondary | ICD-10-CM | POA: Diagnosis not present

## 2015-12-12 DIAGNOSIS — E1122 Type 2 diabetes mellitus with diabetic chronic kidney disease: Secondary | ICD-10-CM | POA: Diagnosis not present

## 2015-12-12 DIAGNOSIS — I129 Hypertensive chronic kidney disease with stage 1 through stage 4 chronic kidney disease, or unspecified chronic kidney disease: Secondary | ICD-10-CM | POA: Diagnosis not present

## 2015-12-13 DIAGNOSIS — I10 Essential (primary) hypertension: Secondary | ICD-10-CM | POA: Diagnosis not present

## 2015-12-13 DIAGNOSIS — R809 Proteinuria, unspecified: Secondary | ICD-10-CM | POA: Diagnosis not present

## 2015-12-13 DIAGNOSIS — N183 Chronic kidney disease, stage 3 (moderate): Secondary | ICD-10-CM | POA: Diagnosis not present

## 2015-12-14 ENCOUNTER — Other Ambulatory Visit: Payer: Self-pay | Admitting: Family Medicine

## 2015-12-14 DIAGNOSIS — I129 Hypertensive chronic kidney disease with stage 1 through stage 4 chronic kidney disease, or unspecified chronic kidney disease: Secondary | ICD-10-CM | POA: Diagnosis not present

## 2015-12-14 DIAGNOSIS — I69354 Hemiplegia and hemiparesis following cerebral infarction affecting left non-dominant side: Secondary | ICD-10-CM | POA: Diagnosis not present

## 2015-12-14 DIAGNOSIS — E1122 Type 2 diabetes mellitus with diabetic chronic kidney disease: Secondary | ICD-10-CM | POA: Diagnosis not present

## 2015-12-14 DIAGNOSIS — N183 Chronic kidney disease, stage 3 (moderate): Secondary | ICD-10-CM | POA: Diagnosis not present

## 2015-12-14 DIAGNOSIS — M6281 Muscle weakness (generalized): Secondary | ICD-10-CM | POA: Diagnosis not present

## 2015-12-14 DIAGNOSIS — F418 Other specified anxiety disorders: Secondary | ICD-10-CM | POA: Diagnosis not present

## 2015-12-14 NOTE — Telephone Encounter (Signed)
Pt has f/u appt on 02/24/16, last refilled on 08/01/15 #30 tabs with 3 additional refills, please advise

## 2015-12-14 NOTE — Telephone Encounter (Signed)
Rx called in as prescribed 

## 2015-12-14 NOTE — Telephone Encounter (Signed)
Px written for call in   

## 2015-12-16 DIAGNOSIS — E1122 Type 2 diabetes mellitus with diabetic chronic kidney disease: Secondary | ICD-10-CM | POA: Diagnosis not present

## 2015-12-16 DIAGNOSIS — N183 Chronic kidney disease, stage 3 (moderate): Secondary | ICD-10-CM | POA: Diagnosis not present

## 2015-12-16 DIAGNOSIS — M6281 Muscle weakness (generalized): Secondary | ICD-10-CM | POA: Diagnosis not present

## 2015-12-16 DIAGNOSIS — I129 Hypertensive chronic kidney disease with stage 1 through stage 4 chronic kidney disease, or unspecified chronic kidney disease: Secondary | ICD-10-CM | POA: Diagnosis not present

## 2015-12-16 DIAGNOSIS — F418 Other specified anxiety disorders: Secondary | ICD-10-CM | POA: Diagnosis not present

## 2015-12-16 DIAGNOSIS — I69354 Hemiplegia and hemiparesis following cerebral infarction affecting left non-dominant side: Secondary | ICD-10-CM | POA: Diagnosis not present

## 2015-12-19 ENCOUNTER — Telehealth: Payer: Self-pay | Admitting: Family Medicine

## 2015-12-19 NOTE — Telephone Encounter (Signed)
Take the alprazolam tonight Take the metoprolol tonight since it is bid anyway Hold the mirtazapine  Please make sure she is not confused, and feeling ok

## 2015-12-19 NOTE — Telephone Encounter (Signed)
Pt didn't answer so I left detailed voicemail letting know what meds to take and advise her if she is feeling bad in any way to let us know ASAP

## 2015-12-19 NOTE — Telephone Encounter (Signed)
Patient Name: Renee [name]  DOB: Sep 22, 1928    Initial Comment Caller States pt took her pm meds and not her am meds.    Nurse Assessment  Nurse: Raphael Gibney, RN, Vera Date/Time (Eastern Time): 12/19/2015 12:05:05 PM  Confirm and document reason for call. If symptomatic, describe symptoms. You must click the next button to save text entered. ---Caller states pt took her pm meds now. She has taken aprazolam which she takes at HS; Hydralazine which she takes TID; Metoprolol 1/2 tab which she takes BID and she has already taken 1/2 tab of metoprolol this am and mirtadazine 1/2 tab which she takes at HS. She is acting fine.  Has the patient traveled out of the country within the last 30 days? ---Not Applicable  Does the patient have any new or worsening symptoms? ---Yes  Will a triage be completed? ---Yes  Related visit to physician within the last 2 weeks? ---No  Does the PT have any chronic conditions? (i.e. diabetes, asthma, etc.) ---Yes  List chronic conditions. ---HTN  Is this a behavioral health or substance abuse call? ---No     Guidelines    Guideline Title Affirmed Question Affirmed Notes  Poisoning [1] DOUBLE DOSE (an extra dose or lesser amount) of prescription drug AND [2] NO symptoms    Final Disposition User   Call PCP Now Raphael Gibney, RN, Vanita Ingles    Comments  please call pt back regarding whether she needs to take her alprazolam at HS, her metoprolol 1/2 tab which she has taken twice today and her mirtadazine 1/2 tab at HS all of which she took a few min ago.   Referrals  REFERRED TO PCP OFFICE   Disagree/Comply: Comply

## 2015-12-20 DIAGNOSIS — I69354 Hemiplegia and hemiparesis following cerebral infarction affecting left non-dominant side: Secondary | ICD-10-CM | POA: Diagnosis not present

## 2015-12-20 DIAGNOSIS — E1122 Type 2 diabetes mellitus with diabetic chronic kidney disease: Secondary | ICD-10-CM | POA: Diagnosis not present

## 2015-12-20 DIAGNOSIS — N183 Chronic kidney disease, stage 3 (moderate): Secondary | ICD-10-CM | POA: Diagnosis not present

## 2015-12-20 DIAGNOSIS — I129 Hypertensive chronic kidney disease with stage 1 through stage 4 chronic kidney disease, or unspecified chronic kidney disease: Secondary | ICD-10-CM | POA: Diagnosis not present

## 2015-12-20 DIAGNOSIS — F418 Other specified anxiety disorders: Secondary | ICD-10-CM | POA: Diagnosis not present

## 2015-12-20 DIAGNOSIS — M6281 Muscle weakness (generalized): Secondary | ICD-10-CM | POA: Diagnosis not present

## 2015-12-21 ENCOUNTER — Other Ambulatory Visit: Payer: Self-pay | Admitting: Family Medicine

## 2015-12-21 NOTE — Telephone Encounter (Signed)
Pt said she did start back taking med. Pt said she thinks she is taking 2.5mg  but she isn't 100% sure, but she didn't want to take the 5 mg due to hypoglycemia, I added the 2.5mg  Rx to refill request, please advise

## 2015-12-21 NOTE — Telephone Encounter (Signed)
Please refill the 2.5 mg for a year  Decline the 5  thanks

## 2015-12-21 NOTE — Telephone Encounter (Signed)
She was previously having trouble with hypoglycemia - we decreased dose to 2.5 mg and then it may have been stopped in the hospital  Please ask her if she is back to taking it (she may have re started it) and if so -how much Thanks

## 2015-12-21 NOTE — Telephone Encounter (Signed)
Electronic refill request, not on med list, and I can't tell from notes if pt is suppose to be on med or not, please advise

## 2015-12-22 MED ORDER — GLIPIZIDE ER 2.5 MG PO TB24: 2.5000 mg | ORAL_TABLET | Freq: Every day | ORAL | Status: DC

## 2015-12-22 NOTE — Telephone Encounter (Signed)
done

## 2016-02-08 ENCOUNTER — Other Ambulatory Visit: Payer: Self-pay | Admitting: Family Medicine

## 2016-02-17 ENCOUNTER — Other Ambulatory Visit (INDEPENDENT_AMBULATORY_CARE_PROVIDER_SITE_OTHER): Payer: Medicare Other

## 2016-02-17 DIAGNOSIS — E1165 Type 2 diabetes mellitus with hyperglycemia: Secondary | ICD-10-CM | POA: Diagnosis not present

## 2016-02-17 DIAGNOSIS — I10 Essential (primary) hypertension: Secondary | ICD-10-CM

## 2016-02-17 LAB — COMPREHENSIVE METABOLIC PANEL
ALT: 11 U/L (ref 0–35)
AST: 14 U/L (ref 0–37)
Albumin: 3.7 g/dL (ref 3.5–5.2)
Alkaline Phosphatase: 75 U/L (ref 39–117)
BUN: 23 mg/dL (ref 6–23)
CO2: 30 mEq/L (ref 19–32)
Calcium: 9 mg/dL (ref 8.4–10.5)
Chloride: 109 mEq/L (ref 96–112)
Creatinine, Ser: 1.38 mg/dL — ABNORMAL HIGH (ref 0.40–1.20)
GFR: 38.63 mL/min — ABNORMAL LOW (ref >60.00)
Glucose, Bld: 111 mg/dL — ABNORMAL HIGH (ref 70–99)
Potassium: 4.4 mEq/L (ref 3.5–5.1)
Sodium: 143 mEq/L (ref 135–145)
Total Bilirubin: 0.4 mg/dL (ref 0.2–1.2)
Total Protein: 5.7 g/dL — ABNORMAL LOW (ref 6.0–8.3)

## 2016-02-17 LAB — CBC WITH DIFFERENTIAL/PLATELET
Basophils Absolute: 0.1 10*3/uL (ref 0.0–0.1)
Basophils Relative: 1.1 % (ref 0.0–3.0)
Eosinophils Absolute: 0.3 10*3/uL (ref 0.0–0.7)
Eosinophils Relative: 6.9 % — ABNORMAL HIGH (ref 0.0–5.0)
HCT: 36.1 % (ref 36.0–46.0)
Hemoglobin: 11.9 g/dL — ABNORMAL LOW (ref 12.0–15.0)
Lymphocytes Relative: 25.2 % (ref 12.0–46.0)
Lymphs Abs: 1.1 10*3/uL (ref 0.7–4.0)
MCHC: 32.8 g/dL (ref 30.0–36.0)
MCV: 84.2 fl (ref 78.0–100.0)
Monocytes Absolute: 0.3 10*3/uL (ref 0.1–1.0)
Monocytes Relative: 6.7 % (ref 3.0–12.0)
Neutro Abs: 2.7 10*3/uL (ref 1.4–7.7)
Neutrophils Relative %: 60.1 % (ref 43.0–77.0)
Platelets: 188 10*3/uL (ref 150.0–400.0)
RBC: 4.28 Mil/uL (ref 3.87–5.11)
RDW: 15 % (ref 11.5–15.5)
WBC: 4.5 10*3/uL (ref 4.0–10.5)

## 2016-02-17 LAB — HEMOGLOBIN A1C: Hgb A1c MFr Bld: 5.7 % (ref 4.6–6.5)

## 2016-02-17 LAB — TSH: TSH: 2.32 u[IU]/mL (ref 0.35–4.50)

## 2016-02-24 ENCOUNTER — Ambulatory Visit (INDEPENDENT_AMBULATORY_CARE_PROVIDER_SITE_OTHER): Payer: Medicare Other | Admitting: Family Medicine

## 2016-02-24 ENCOUNTER — Encounter: Payer: Self-pay | Admitting: Family Medicine

## 2016-02-24 VITALS — BP 122/60 | HR 66 | Temp 98.3°F | Ht 62.25 in | Wt 182.8 lb

## 2016-02-24 DIAGNOSIS — N289 Disorder of kidney and ureter, unspecified: Secondary | ICD-10-CM

## 2016-02-24 DIAGNOSIS — I10 Essential (primary) hypertension: Secondary | ICD-10-CM | POA: Diagnosis not present

## 2016-02-24 DIAGNOSIS — E1165 Type 2 diabetes mellitus with hyperglycemia: Secondary | ICD-10-CM

## 2016-02-24 DIAGNOSIS — Z7409 Other reduced mobility: Secondary | ICD-10-CM | POA: Diagnosis not present

## 2016-02-24 DIAGNOSIS — E785 Hyperlipidemia, unspecified: Secondary | ICD-10-CM | POA: Diagnosis not present

## 2016-02-24 DIAGNOSIS — E538 Deficiency of other specified B group vitamins: Secondary | ICD-10-CM

## 2016-02-24 MED ORDER — CYANOCOBALAMIN 1000 MCG/ML IJ SOLN
1000.0000 ug | Freq: Once | INTRAMUSCULAR | Status: AC
  Administered 2016-02-24: 1000 ug via INTRAMUSCULAR

## 2016-02-24 NOTE — Assessment & Plan Note (Signed)
Improved with resolution of pneumonia/ better diet and more activity Also 2.5 mg glipizide xl No hypoglycemia Lab Results  Component Value Date   HGBA1C 5.7 02/17/2016   F/u 6 mo  Will get her eye exam as planned

## 2016-02-24 NOTE — Assessment & Plan Note (Addendum)
Pt desires wheeled walker with seat-px written  No change in status on functional eval- can walk with a cane only for short distances (needs a seat to rest for intervals while shopping)

## 2016-02-24 NOTE — Assessment & Plan Note (Signed)
Lab Results  Component Value Date   CREATININE 1.38* 02/17/2016   Reminded to keep fluid intake up - caregiver will watch this  F/u with renal in Aug as planned

## 2016-02-24 NOTE — Assessment & Plan Note (Signed)
B12 shot today (to get every 1-2 mo) Lab Results  Component Value Date   VITAMINB12 206* 08/30/2015   Will try to stay on top of this

## 2016-02-24 NOTE — Progress Notes (Signed)
Subjective:    Patient ID: Renee [name], female    DOB: [birth year], 82 y.o.   MRN: [id number]  HPI Here for f/u of chronic medical problems   Wt is up 1 lb with bmi of 33  In obese range   Feels pretty good-has a good caregiver and doing well   bp is stable today  No cp or palpitations or headaches or edema  No side effects to medicines  A little higher at home -no heacaches  BP Readings from Last 3 Encounters:  02/24/16 122/60  11/21/15 140/70  10/19/15 146/70       Chemistry      Component Value Date/Time   NA 143 02/17/2016 1112   K 4.4 02/17/2016 1112   CL 109 02/17/2016 1112   CO2 30 02/17/2016 1112   BUN 23 02/17/2016 1112   CREATININE 1.38* 02/17/2016 1112      Component Value Date/Time   CALCIUM 9.0 02/17/2016 1112   ALKPHOS 75 02/17/2016 1112   AST 14 02/17/2016 1112   ALT 11 02/17/2016 1112   BILITOT 0.4 02/17/2016 1112      Sees kidney doctor for renal insuff Trying to drink water (no soda)  Blood count is improved Lab Results  Component Value Date   WBC 4.5 02/17/2016   HGB 11.9* 02/17/2016   HCT 36.1 02/17/2016   MCV 84.2 02/17/2016   PLT 188.0 02/17/2016     Diabetes Home sugar results (most of the time in the 90s and checks once per day)  DM diet -trying to eat more vegetable- good this time of year  Exercise -not a lot / does go out a lot with her caregiver/out in the garden Symptoms-none  A1C last  Lab Results  Component Value Date   HGBA1C 5.7 02/17/2016  this is down from 7.1  No problems with medications -on glipizide xl Renal protection-no ace due to renal dz  Last eye exam 7/16 - got a card in the mail  Dental appt is next week   Cholesterol Lab Results  Component Value Date   CHOL 185 08/30/2015   CHOL 195 02/18/2015   CHOL 201* 11/09/2014   Lab Results  Component Value Date   HDL 33.90* 08/30/2015   HDL 35.40* 02/18/2015   HDL 34.00* 11/09/2014   Lab Results  Component Value Date   LDLCALC 124* 02/18/2015     LDLCALC 117* 04/20/2014   LDLCALC 126* 10/21/2013   Lab Results  Component Value Date   TRIG 205.0* 08/30/2015   TRIG 176.0* 02/18/2015   TRIG 204.0* 11/09/2014   Lab Results  Component Value Date   CHOLHDL 5 08/30/2015   CHOLHDL 6 02/18/2015   CHOLHDL 6 11/09/2014   Lab Results  Component Value Date   LDLDIRECT 100.0 08/30/2015   LDLDIRECT 120.0 11/09/2014   takes lopid LDL of 100 in Jan   Mobility impaired with hemiplegia from prev cva Desires wheeled walker with a seat  This would help while shopping  Uses a cane for shorter distances   Patient Active Problem List   Diagnosis Date Noted  . Weakness 10/21/2015  . Acute renal failure superimposed on stage 3 chronic kidney disease ([place]) 10/12/2015  . Lobar pneumonia due to unspecified organism   . CAP (community acquired pneumonia) 10/07/2015  . HCAP (healthcare-associated pneumonia) 10/07/2015  . Hyperglycemia 10/07/2015  . Hyperosmolar syndrome 10/07/2015  . Cerumen impaction 09/27/2015  . Estrogen deficiency 08/30/2015  . Grief reaction 08/30/2015  . UTI (  urinary tract infection) 05/31/2014  . Abnormal TSH 04/27/2014  . Encounter for Medicare annual wellness exam 04/17/2013  . Mobility impaired 06/26/2011  . History of retinal detachment 01/10/2011  . Sleep apnea 11/28/2010  . Depression with anxiety 08/25/2010  . Hemiplegia, late effect of cerebrovascular disease ([place]) 07/05/2010  . POSTHERPETIC NEURALGIA 11/09/2009  . Osteopenia 12/20/2008  . MUSCLE WEAKNESS (GENERALIZED) 08/03/2008  . Renal insufficiency 06/29/2008  . BACK PAIN 01/26/2008  . EDEMA 01/26/2008  . B12 deficiency 01/10/2007  . Diabetes type 2, controlled ([place]) 11/27/2006  . Hyperlipidemia 11/27/2006  . Essential hypertension 11/27/2006  . FIBROCYSTIC BREAST DISEASE 11/27/2006  . ROSACEA 11/27/2006  . OSTEOARTHRITIS 11/27/2006  . URINARY INCONTINENCE, MIXED 11/27/2006  . ANGIOEDEMA 11/27/2006   Past Medical History  Diagnosis Date   . Sleep apnea   . Diabetes mellitus     type II  . Hypertension   . Hyperlipidemia   . Stroke [facility]) 05/2010    Small vessel sobcortical (in [place] trial) with Dr Leonie Man, residual L hemiparesis  . Osteoarthritis   . Angioedema     possibly from voltaren  . Vitamin B 12 deficiency 04/08  . LVH (left ventricular hypertrophy)     and atrial enlargement by echo in past with nl EF  . Degenerative disc disease   . Osteopenia   . Nasal pruritis   . Renal insufficiency    Past Surgical History  Procedure Laterality Date  . Eye surgery      cataract extraction  . Abdominal hysterectomy      BSO-fibroids  . Knee surgery      arthroscope  . Spine surgery  08/09    spinal decompression surgery  . Retinal detachment surgery  02/18/11    times 2  . Back surgery    . Colon surgery      due to punctured intestines  . Appendectomy    . Pars plana vitrectomy  07/31/2011    Procedure: PARS PLANA VITRECTOMY WITH 25 GAUGE;  Surgeon: [name], MD;  Location: [place];  Service: Ophthalmology;  Laterality: Right;  REMOVAL OF SILICONE OIL AND LASER RIGHT EYE   Social History  Substance Use Topics  . Smoking status: Never Smoker   . Smokeless tobacco: Never Used  . Alcohol Use: No   Family History  Problem Relation Age of Onset  . Cancer Sister     brain tumor with hemmorhage  . Heart disease Sister     CAD  . COPD Brother    Allergies  Allergen Reactions  . Diclofenac Sodium Other (See Comments)    REACTION: angioedema  . Pioglitazone Swelling   Current Outpatient Prescriptions on File Prior to Visit  Medication Sig Dispense Refill  . acetaminophen (TYLENOL) 500 MG tablet Take 500 mg by mouth 2 (two) times daily.    Marland Kitchen ALPRAZolam (XANAX) 0.5 MG tablet TAKE 1 TABLET BY MOUTH TWICE A DAY AS NEEDED FOR ANXIETY (SEDATION WARNING) 30 tablet 3  . amLODipine (NORVASC) 10 MG tablet Take 1 tablet (10 mg total) by mouth daily. 90 tablet 3  . aspirin 325 MG tablet Take 325 mg by mouth  daily.     . beta carotene w/minerals (OCUVITE) tablet Take 1 tablet by mouth at bedtime.    . calcium-vitamin D (OSCAL WITH D) 500-200 MG-UNIT per tablet Take 1 tablet by mouth daily.     . Cholecalciferol (VITAMIN D) 1000 UNITS capsule Take 1,000 Units by mouth daily.     Marland Kitchen  cyanocobalamin (,VITAMIN B-12,) 1000 MCG/ML injection Inject 1,000 mcg into the muscle every 30 (thirty) days.     Marland Kitchen docusate sodium (COLACE) 100 MG capsule Take 200 mg by mouth at bedtime.    . ferrous sulfate 325 (65 FE) MG tablet Take 325 mg by mouth 2 (two) times daily.    Marland Kitchen gemfibrozil (LOPID) 600 MG tablet TAKE 1 TABLET EVERY DAY 90 tablet 1  . glipiZIDE (GLUCOTROL XL) 2.5 MG 24 hr tablet Take 1 tablet (2.5 mg total) by mouth daily with breakfast. 30 tablet 11  . guaiFENesin (ROBITUSSIN) 100 MG/5ML SOLN Take 5 mLs by mouth every 4 (four) hours as needed for cough or to loosen phlegm.    . hydrALAZINE (APRESOLINE) 100 MG tablet Take 100 mg by mouth 3 (three) times daily.     . metoprolol succinate (TOPROL-XL) 25 MG 24 hr tablet TAKE 1/2 TABLET TWICE DAILY 90 tablet 0  . mirtazapine (REMERON) 15 MG tablet TAKE 1/2 TABLET AT BEDTIME 45 tablet 11  . Multiple Vitamin (MULTIVITAMIN) capsule Take 1 capsule by mouth daily.     . sertraline (ZOLOFT) 50 MG tablet TAKE 1 TABLET EVERY DAY 90 tablet 0   No current facility-administered medications on file prior to visit.     due for B12 shot  Lab Results  Component Value Date   VITAMINB12 206* 08/30/2015   Patient Active Problem List   Diagnosis Date Noted  . Weakness 10/21/2015  . Acute renal failure superimposed on stage 3 chronic kidney disease ([place]) 10/12/2015  . Lobar pneumonia due to unspecified organism   . CAP (community acquired pneumonia) 10/07/2015  . HCAP (healthcare-associated pneumonia) 10/07/2015  . Hyperglycemia 10/07/2015  . Hyperosmolar syndrome 10/07/2015  . Cerumen impaction 09/27/2015  . Estrogen deficiency 08/30/2015  . Grief reaction 08/30/2015    . UTI (urinary tract infection) 05/31/2014  . Abnormal TSH 04/27/2014  . Encounter for Medicare annual wellness exam 04/17/2013  . Mobility impaired 06/26/2011  . History of retinal detachment 01/10/2011  . Sleep apnea 11/28/2010  . Depression with anxiety 08/25/2010  . Hemiplegia, late effect of cerebrovascular disease ([place]) 07/05/2010  . POSTHERPETIC NEURALGIA 11/09/2009  . Osteopenia 12/20/2008  . MUSCLE WEAKNESS (GENERALIZED) 08/03/2008  . Renal insufficiency 06/29/2008  . BACK PAIN 01/26/2008  . EDEMA 01/26/2008  . B12 deficiency 01/10/2007  . Diabetes type 2, controlled ([place]) 11/27/2006  . Hyperlipidemia 11/27/2006  . Essential hypertension 11/27/2006  . FIBROCYSTIC BREAST DISEASE 11/27/2006  . ROSACEA 11/27/2006  . OSTEOARTHRITIS 11/27/2006  . URINARY INCONTINENCE, MIXED 11/27/2006  . ANGIOEDEMA 11/27/2006   Past Medical History  Diagnosis Date  . Sleep apnea   . Diabetes mellitus     type II  . Hypertension   . Hyperlipidemia   . Stroke [facility]) 05/2010    Small vessel sobcortical (in [place] trial) with Dr Leonie Man, residual L hemiparesis  . Osteoarthritis   . Angioedema     possibly from voltaren  . Vitamin B 12 deficiency 04/08  . LVH (left ventricular hypertrophy)     and atrial enlargement by echo in past with nl EF  . Degenerative disc disease   . Osteopenia   . Nasal pruritis   . Renal insufficiency    Past Surgical History  Procedure Laterality Date  . Eye surgery      cataract extraction  . Abdominal hysterectomy      BSO-fibroids  . Knee surgery      arthroscope  . Spine surgery  08/09  spinal decompression surgery  . Retinal detachment surgery  02/18/11    times 2  . Back surgery    . Colon surgery      due to punctured intestines  . Appendectomy    . Pars plana vitrectomy  07/31/2011    Procedure: PARS PLANA VITRECTOMY WITH 25 GAUGE;  Surgeon: [name], MD;  Location: [place];  Service: Ophthalmology;  Laterality: Right;  REMOVAL OF  SILICONE OIL AND LASER RIGHT EYE   Social History  Substance Use Topics  . Smoking status: Never Smoker   . Smokeless tobacco: Never Used  . Alcohol Use: No   Family History  Problem Relation Age of Onset  . Cancer Sister     brain tumor with hemmorhage  . Heart disease Sister     CAD  . COPD Brother    Allergies  Allergen Reactions  . Diclofenac Sodium Other (See Comments)    REACTION: angioedema  . Pioglitazone Swelling   Current Outpatient Prescriptions on File Prior to Visit  Medication Sig Dispense Refill  . acetaminophen (TYLENOL) 500 MG tablet Take 500 mg by mouth 2 (two) times daily.    Marland Kitchen ALPRAZolam (XANAX) 0.5 MG tablet TAKE 1 TABLET BY MOUTH TWICE A DAY AS NEEDED FOR ANXIETY (SEDATION WARNING) 30 tablet 3  . amLODipine (NORVASC) 10 MG tablet Take 1 tablet (10 mg total) by mouth daily. 90 tablet 3  . aspirin 325 MG tablet Take 325 mg by mouth daily.     . beta carotene w/minerals (OCUVITE) tablet Take 1 tablet by mouth at bedtime.    . calcium-vitamin D (OSCAL WITH D) 500-200 MG-UNIT per tablet Take 1 tablet by mouth daily.     . Cholecalciferol (VITAMIN D) 1000 UNITS capsule Take 1,000 Units by mouth daily.     . cyanocobalamin (,VITAMIN B-12,) 1000 MCG/ML injection Inject 1,000 mcg into the muscle every 30 (thirty) days.     Marland Kitchen docusate sodium (COLACE) 100 MG capsule Take 200 mg by mouth at bedtime.    . ferrous sulfate 325 (65 FE) MG tablet Take 325 mg by mouth 2 (two) times daily.    Marland Kitchen gemfibrozil (LOPID) 600 MG tablet TAKE 1 TABLET EVERY DAY 90 tablet 1  . glipiZIDE (GLUCOTROL XL) 2.5 MG 24 hr tablet Take 1 tablet (2.5 mg total) by mouth daily with breakfast. 30 tablet 11  . guaiFENesin (ROBITUSSIN) 100 MG/5ML SOLN Take 5 mLs by mouth every 4 (four) hours as needed for cough or to loosen phlegm.    . hydrALAZINE (APRESOLINE) 100 MG tablet Take 100 mg by mouth 3 (three) times daily.     . metoprolol succinate (TOPROL-XL) 25 MG 24 hr tablet TAKE 1/2 TABLET TWICE DAILY  90 tablet 0  . mirtazapine (REMERON) 15 MG tablet TAKE 1/2 TABLET AT BEDTIME 45 tablet 11  . Multiple Vitamin (MULTIVITAMIN) capsule Take 1 capsule by mouth daily.     . sertraline (ZOLOFT) 50 MG tablet TAKE 1 TABLET EVERY DAY 90 tablet 0   No current facility-administered medications on file prior to visit.    Review of Systems Review of Systems  Constitutional: Negative for fever, appetite change, fatigue and unexpected weight change.  Eyes: Negative for pain and visual disturbance.  Respiratory: Negative for cough and shortness of breath.   Cardiovascular: Negative for cp or palpitations    Gastrointestinal: Negative for nausea, diarrhea and constipation.  Genitourinary: Negative for urgency and frequency.  Skin: Negative for pallor or rash  Neurological: Negative for weakness, light-headedness, numbness and headaches. pos for baseline hemiparesis and mobility impairment Hematological: Negative for adenopathy. Does not bruise/bleed easily.  Psychiatric/Behavioral: Negative for dysphoric mood. The patient is not nervous/anxious.  (mood has been good)       Objective:   Physical Exam  Constitutional: She appears well-developed and well-nourished. No distress.  obese and well appearing   HENT:  Head: Normocephalic and atraumatic.  Mouth/Throat: Oropharynx is clear and moist.  Eyes: Conjunctivae and EOM are normal. Pupils are equal, round, and reactive to light.  Neck: Normal range of motion. Neck supple. No JVD present. Carotid bruit is not present. No thyromegaly present.  Cardiovascular: Normal rate, regular rhythm, normal heart sounds and intact distal pulses.  Exam reveals no gallop.   Pulmonary/Chest: Effort normal and breath sounds normal. No respiratory distress. She has no wheezes. She has no rales.  No crackles  Abdominal: Soft. Bowel sounds are normal. She exhibits no distension, no abdominal bruit and no mass. There is no tenderness.  Musculoskeletal: She exhibits no  edema.  Lymphadenopathy:    She has no cervical adenopathy.  Neurological: She is alert. She has normal reflexes.  Baseline hemiparesis Cannot rise from chair w/o assistance today  Gait is slow and wide based with cane    Skin: Skin is warm and dry. No rash noted. No pallor.  Psychiatric: She has a normal mood and affect.  Speech is slow but not slurred  Pleasant           Assessment & Plan:   Problem List Items Addressed This Visit      Cardiovascular and Mediastinum   Essential hypertension - Primary    bp in fair control at this time  BP Readings from Last 1 Encounters:  02/24/16 122/60   No changes needed Disc lifstyle change with low sodium diet and exercise  Labs reviewed  bp is higher at home-her caregiver will see if cuff needs to be calibrated and will bring it to next visit         Digestive   B12 deficiency    B12 shot today (to get every 1-2 mo) Lab Results  Component Value Date   VITAMINB12 206* 08/30/2015   Will try to stay on top of this      Relevant Medications   cyanocobalamin ((VITAMIN B-12)) injection 1,000 mcg (Completed)     Endocrine   Diabetes type 2, controlled ([place])    Improved with resolution of pneumonia/ better diet and more activity Also 2.5 mg glipizide xl No hypoglycemia Lab Results  Component Value Date   HGBA1C 5.7 02/17/2016   F/u 6 mo  Will get her eye exam as planned         Genitourinary   Renal insufficiency    Lab Results  Component Value Date   CREATININE 1.38* 02/17/2016   Reminded to keep fluid intake up - caregiver will watch this  F/u with renal in Aug as planned        Other   Mobility impaired    Pt desires wheeled walker with seat-px written  No change in status on functional eval- can walk with a cane only for short distances (needs a seat to rest for intervals while shopping)       Hyperlipidemia

## 2016-02-24 NOTE — Assessment & Plan Note (Signed)
bp in fair control at this time  BP Readings from Last 1 Encounters:  02/24/16 122/60   No changes needed Disc lifstyle change with low sodium diet and exercise  Labs reviewed  bp is higher at home-her caregiver will see if cuff needs to be calibrated and will bring it to next visit

## 2016-02-24 NOTE — Progress Notes (Signed)
Pre visit review using our clinic review tool, if applicable. No additional management support is needed unless otherwise documented below in the visit note. 

## 2016-02-24 NOTE — Patient Instructions (Addendum)
Your A1C is in good control BP is good here today  Don't forget to make your eye doctor appt  Follow up in 6 months for your annual exam  B12 shot today

## 2016-03-07 ENCOUNTER — Other Ambulatory Visit: Payer: Self-pay | Admitting: Family Medicine

## 2016-03-08 NOTE — Telephone Encounter (Signed)
I had her start it back  Please refill for a year

## 2016-03-08 NOTE — Telephone Encounter (Signed)
Trandolapril says it was d/c when pt was in the hospital in Feb and it's not on med list anymore, is pt suppose to be on med? please advise

## 2016-03-08 NOTE — Telephone Encounter (Signed)
done

## 2016-03-27 DIAGNOSIS — I10 Essential (primary) hypertension: Secondary | ICD-10-CM | POA: Diagnosis not present

## 2016-03-27 DIAGNOSIS — N183 Chronic kidney disease, stage 3 (moderate): Secondary | ICD-10-CM | POA: Diagnosis not present

## 2016-03-28 DIAGNOSIS — E119 Type 2 diabetes mellitus without complications: Secondary | ICD-10-CM | POA: Diagnosis not present

## 2016-04-11 ENCOUNTER — Other Ambulatory Visit: Payer: Self-pay | Admitting: Family Medicine

## 2016-04-11 NOTE — Telephone Encounter (Signed)
Pt has CPE scheduled on 09/05/16, last filled on 12/14/15 #30 with 3 additional refills, please advise

## 2016-04-11 NOTE — Telephone Encounter (Signed)
Rx called in as prescribed 

## 2016-04-11 NOTE — Telephone Encounter (Signed)
Px written for call in   

## 2016-04-16 ENCOUNTER — Other Ambulatory Visit: Payer: Self-pay | Admitting: Family Medicine

## 2016-04-16 DIAGNOSIS — S0502XA Injury of conjunctiva and corneal abrasion without foreign body, left eye, initial encounter: Secondary | ICD-10-CM | POA: Diagnosis not present

## 2016-05-02 ENCOUNTER — Ambulatory Visit (INDEPENDENT_AMBULATORY_CARE_PROVIDER_SITE_OTHER): Payer: Medicare Other | Admitting: Ophthalmology

## 2016-05-02 DIAGNOSIS — H353111 Nonexudative age-related macular degeneration, right eye, early dry stage: Secondary | ICD-10-CM | POA: Diagnosis not present

## 2016-05-02 DIAGNOSIS — I10 Essential (primary) hypertension: Secondary | ICD-10-CM | POA: Diagnosis not present

## 2016-05-02 DIAGNOSIS — E113393 Type 2 diabetes mellitus with moderate nonproliferative diabetic retinopathy without macular edema, bilateral: Secondary | ICD-10-CM

## 2016-05-02 DIAGNOSIS — H353122 Nonexudative age-related macular degeneration, left eye, intermediate dry stage: Secondary | ICD-10-CM

## 2016-05-02 DIAGNOSIS — H43812 Vitreous degeneration, left eye: Secondary | ICD-10-CM | POA: Diagnosis not present

## 2016-05-02 DIAGNOSIS — H35033 Hypertensive retinopathy, bilateral: Secondary | ICD-10-CM

## 2016-05-02 DIAGNOSIS — E11319 Type 2 diabetes mellitus with unspecified diabetic retinopathy without macular edema: Secondary | ICD-10-CM

## 2016-05-02 LAB — HM DIABETES EYE EXAM

## 2016-05-21 ENCOUNTER — Other Ambulatory Visit: Payer: Self-pay | Admitting: Family Medicine

## 2016-05-22 ENCOUNTER — Ambulatory Visit (INDEPENDENT_AMBULATORY_CARE_PROVIDER_SITE_OTHER): Payer: Medicare Other

## 2016-05-22 DIAGNOSIS — Z23 Encounter for immunization: Secondary | ICD-10-CM | POA: Diagnosis not present

## 2016-06-13 ENCOUNTER — Encounter: Payer: Self-pay | Admitting: Family Medicine

## 2016-06-13 DIAGNOSIS — Z1231 Encounter for screening mammogram for malignant neoplasm of breast: Secondary | ICD-10-CM | POA: Diagnosis not present

## 2016-07-27 DIAGNOSIS — R809 Proteinuria, unspecified: Secondary | ICD-10-CM | POA: Diagnosis not present

## 2016-07-27 DIAGNOSIS — N183 Chronic kidney disease, stage 3 (moderate): Secondary | ICD-10-CM | POA: Diagnosis not present

## 2016-07-27 DIAGNOSIS — I10 Essential (primary) hypertension: Secondary | ICD-10-CM | POA: Diagnosis not present

## 2016-08-09 ENCOUNTER — Other Ambulatory Visit: Payer: Self-pay | Admitting: Family Medicine

## 2016-08-10 NOTE — Telephone Encounter (Signed)
Px written for call in   

## 2016-08-10 NOTE — Telephone Encounter (Signed)
Rx called in as prescribe

## 2016-08-10 NOTE — Telephone Encounter (Signed)
Pt has CPE on 09/05/16, last filled on 04/11/16 #30 with 3 additional refill, please advise

## 2016-08-16 ENCOUNTER — Other Ambulatory Visit: Payer: Self-pay | Admitting: Family Medicine

## 2016-08-25 ENCOUNTER — Telehealth: Payer: Self-pay | Admitting: Family Medicine

## 2016-08-25 DIAGNOSIS — I10 Essential (primary) hypertension: Secondary | ICD-10-CM

## 2016-08-25 DIAGNOSIS — R7989 Other specified abnormal findings of blood chemistry: Secondary | ICD-10-CM

## 2016-08-25 DIAGNOSIS — E1165 Type 2 diabetes mellitus with hyperglycemia: Secondary | ICD-10-CM

## 2016-08-25 DIAGNOSIS — N289 Disorder of kidney and ureter, unspecified: Secondary | ICD-10-CM

## 2016-08-25 DIAGNOSIS — E538 Deficiency of other specified B group vitamins: Secondary | ICD-10-CM

## 2016-08-25 DIAGNOSIS — E7800 Pure hypercholesterolemia, unspecified: Secondary | ICD-10-CM

## 2016-08-25 NOTE — Telephone Encounter (Signed)
-----   Message from Eustace Pen, LPN sent at 03/22/4372  3:06 PM EST ----- Regarding: Lab Orders 1/12 Please place lab orders. Thank you.

## 2016-08-31 ENCOUNTER — Ambulatory Visit: Payer: Medicare Other

## 2016-08-31 ENCOUNTER — Ambulatory Visit (INDEPENDENT_AMBULATORY_CARE_PROVIDER_SITE_OTHER): Payer: Medicare Other

## 2016-08-31 ENCOUNTER — Other Ambulatory Visit: Payer: Medicare Other

## 2016-08-31 VITALS — BP 126/70 | HR 65 | Temp 98.4°F | Ht 61.5 in | Wt 181.8 lb

## 2016-08-31 DIAGNOSIS — R946 Abnormal results of thyroid function studies: Secondary | ICD-10-CM | POA: Diagnosis not present

## 2016-08-31 DIAGNOSIS — E1165 Type 2 diabetes mellitus with hyperglycemia: Secondary | ICD-10-CM | POA: Diagnosis not present

## 2016-08-31 DIAGNOSIS — N289 Disorder of kidney and ureter, unspecified: Secondary | ICD-10-CM

## 2016-08-31 DIAGNOSIS — R7989 Other specified abnormal findings of blood chemistry: Secondary | ICD-10-CM

## 2016-08-31 DIAGNOSIS — Z0000 Encounter for general adult medical examination without abnormal findings: Secondary | ICD-10-CM

## 2016-08-31 DIAGNOSIS — I10 Essential (primary) hypertension: Secondary | ICD-10-CM

## 2016-08-31 DIAGNOSIS — E538 Deficiency of other specified B group vitamins: Secondary | ICD-10-CM | POA: Diagnosis not present

## 2016-08-31 DIAGNOSIS — E7800 Pure hypercholesterolemia, unspecified: Secondary | ICD-10-CM | POA: Diagnosis not present

## 2016-08-31 LAB — CBC WITH DIFFERENTIAL/PLATELET
Basophils Absolute: 0 10*3/uL (ref 0.0–0.1)
Basophils Relative: 0.3 % (ref 0.0–3.0)
Eosinophils Absolute: 0.3 10*3/uL (ref 0.0–0.7)
Eosinophils Relative: 3.8 % (ref 0.0–5.0)
HCT: 33.4 % — ABNORMAL LOW (ref 36.0–46.0)
Hemoglobin: 11.1 g/dL — ABNORMAL LOW (ref 12.0–15.0)
Lymphocytes Relative: 18.8 % (ref 12.0–46.0)
Lymphs Abs: 1.3 10*3/uL (ref 0.7–4.0)
MCHC: 33.4 g/dL (ref 30.0–36.0)
MCV: 85.3 fl (ref 78.0–100.0)
Monocytes Absolute: 0.5 10*3/uL (ref 0.1–1.0)
Monocytes Relative: 6.7 % (ref 3.0–12.0)
Neutro Abs: 4.9 10*3/uL (ref 1.4–7.7)
Neutrophils Relative %: 70.4 % (ref 43.0–77.0)
Platelets: 175 10*3/uL (ref 150.0–400.0)
RBC: 3.92 Mil/uL (ref 3.87–5.11)
RDW: 14.7 % (ref 11.5–15.5)
WBC: 7 10*3/uL (ref 4.0–10.5)

## 2016-08-31 LAB — LDL CHOLESTEROL, DIRECT: Direct LDL: 88 mg/dL

## 2016-08-31 LAB — COMPREHENSIVE METABOLIC PANEL
ALT: 12 U/L (ref 0–35)
AST: 15 U/L (ref 0–37)
Albumin: 3.7 g/dL (ref 3.5–5.2)
Alkaline Phosphatase: 74 U/L (ref 39–117)
BUN: 24 mg/dL — ABNORMAL HIGH (ref 6–23)
CO2: 29 mEq/L (ref 19–32)
Calcium: 8.9 mg/dL (ref 8.4–10.5)
Chloride: 108 mEq/L (ref 96–112)
Creatinine, Ser: 1.44 mg/dL — ABNORMAL HIGH (ref 0.40–1.20)
GFR: 36.73 mL/min — ABNORMAL LOW (ref >60.00)
Glucose, Bld: 170 mg/dL — ABNORMAL HIGH (ref 70–99)
Potassium: 4.6 mEq/L (ref 3.5–5.1)
Sodium: 142 mEq/L (ref 135–145)
Total Bilirubin: 0.3 mg/dL (ref 0.2–1.2)
Total Protein: 5.4 g/dL — ABNORMAL LOW (ref 6.0–8.3)

## 2016-08-31 LAB — TSH: TSH: 2.02 u[IU]/mL (ref 0.35–4.50)

## 2016-08-31 LAB — LIPID PANEL
Cholesterol: 179 mg/dL (ref 0–200)
HDL: 37.9 mg/dL — ABNORMAL LOW (ref >39.00)
NonHDL: 141.26
Total CHOL/HDL Ratio: 5
Triglycerides: 214 mg/dL — ABNORMAL HIGH (ref 0.0–149.0)
VLDL: 42.8 mg/dL — ABNORMAL HIGH (ref 0.0–40.0)

## 2016-08-31 LAB — VITAMIN B12: Vitamin B-12: 187 pg/mL — ABNORMAL LOW (ref 211–911)

## 2016-08-31 LAB — HEMOGLOBIN A1C: Hgb A1c MFr Bld: 5.6 % (ref 4.6–6.5)

## 2016-08-31 NOTE — Progress Notes (Signed)
Subjective:   Renee [name] is a 83 y.o. female who presents for Medicare Annual (Subsequent) preventive examination.  Review of Systems:  N/A Cardiac Risk Factors include: advanced age (>33men, >39 women);obesity (BMI >30kg/m2);diabetes mellitus;dyslipidemia;hypertension     Objective:     Vitals: BP 126/70 (BP Location: Right Arm, Patient Position: Sitting, Cuff Size: Normal)   Pulse 65   Temp 98.4 F (36.9 C) (Oral)   Ht 5' 1.5" (1.562 m) Comment: no shoes  Wt 181 lb 12 oz (82.4 kg)   SpO2 95%   BMI 33.79 kg/m   Body mass index is 33.79 kg/m.   Tobacco History  Smoking Status  . Never Smoker  Smokeless Tobacco  . Never Used     Counseling given: No   Past Medical History:  Diagnosis Date  . Angioedema    possibly from voltaren  . Degenerative disc disease   . Diabetes mellitus    type II  . Hyperlipidemia   . Hypertension   . LVH (left ventricular hypertrophy)    and atrial enlargement by echo in past with nl EF  . Nasal pruritis   . Osteoarthritis   . Osteopenia   . Renal insufficiency   . Sleep apnea   . Stroke [facility]) 05/2010   Small vessel sobcortical (in [place] trial) with Dr Leonie Man, residual L hemiparesis  . Vitamin B 12 deficiency 04/08   Past Surgical History:  Procedure Laterality Date  . ABDOMINAL HYSTERECTOMY     BSO-fibroids  . APPENDECTOMY    . BACK SURGERY    . COLON SURGERY     due to punctured intestines  . EYE SURGERY     cataract extraction  . KNEE SURGERY     arthroscope  . PARS PLANA VITRECTOMY  07/31/2011   Procedure: PARS PLANA VITRECTOMY WITH 25 GAUGE;  Surgeon: [name], MD;  Location: [facility];  Service: Ophthalmology;  Laterality: Right;  REMOVAL OF SILICONE OIL AND LASER RIGHT EYE  . RETINAL DETACHMENT SURGERY  02/18/11   times 2  . SPINE SURGERY  08/09   spinal decompression surgery   Family History  Problem Relation Age of Onset  . COPD Brother   . Cancer Sister     brain tumor with hemmorhage  . Heart  disease Sister     CAD   History  Sexual Activity  . Sexual activity: No    Outpatient Encounter Prescriptions as of 08/31/2016  Medication Sig  . acetaminophen (TYLENOL) 500 MG tablet Take 500 mg by mouth 2 (two) times daily.  Marland Kitchen ALPRAZolam (XANAX) 0.5 MG tablet TAKE 1 TABLET BY MOUTH TWICE A DAY AS NEEDED FOR ANXIETY  . amLODipine (NORVASC) 10 MG tablet TAKE 1 TABLET EVERY DAY  . aspirin 325 MG tablet Take 325 mg by mouth daily.   . beta carotene w/minerals (OCUVITE) tablet Take 1 tablet by mouth at bedtime.  . calcium-vitamin D (OSCAL WITH D) 500-200 MG-UNIT per tablet Take 1 tablet by mouth daily.   . Cholecalciferol (VITAMIN D) 1000 UNITS capsule Take 1,000 Units by mouth daily.   . cyanocobalamin (,VITAMIN B-12,) 1000 MCG/ML injection Inject 1,000 mcg into the muscle every 30 (thirty) days.   Marland Kitchen docusate sodium (COLACE) 100 MG capsule Take 200 mg by mouth at bedtime.  . ferrous sulfate 325 (65 FE) MG tablet Take 325 mg by mouth 2 (two) times daily.  Marland Kitchen gemfibrozil (LOPID) 600 MG tablet TAKE 1 TABLET EVERY DAY  . glipiZIDE (GLUCOTROL XL)  2.5 MG 24 hr tablet Take 1 tablet (2.5 mg total) by mouth daily with breakfast.  . guaiFENesin (ROBITUSSIN) 100 MG/5ML SOLN Take 5 mLs by mouth every 4 (four) hours as needed for cough or to loosen phlegm.  . hydrALAZINE (APRESOLINE) 100 MG tablet Take 100 mg by mouth 3 (three) times daily.   . metoprolol succinate (TOPROL-XL) 25 MG 24 hr tablet TAKE 1/2 TABLET TWICE DAILY  . mirtazapine (REMERON) 15 MG tablet TAKE 1/2 TABLET AT BEDTIME  . Multiple Vitamin (MULTIVITAMIN) capsule Take 1 capsule by mouth daily.   . sertraline (ZOLOFT) 50 MG tablet TAKE 1 TABLET EVERY DAY  . trandolapril (MAVIK) 4 MG tablet TAKE 1 TABLET EVERY DAY   No facility-administered encounter medications on file as of 08/31/2016.     Activities of Daily Living In your present state of health, do you have any difficulty performing the following activities: 08/31/2016 10/10/2015    Hearing? Tempie Donning  Vision? Y N  Difficulty concentrating or making decisions? N Y  Walking or climbing stairs? Y Y  Dressing or bathing? Y N  Doing errands, shopping? Tempie Donning  Preparing Food and eating ? N -  Using the Toilet? N -  In the past six months, have you accidently leaked urine? Y -  Do you have problems with loss of bowel control? N -  Managing your Medications? N -  Managing your Finances? N -  Housekeeping or managing your Housekeeping? Y -  Some recent data might be hidden    Patient Care Team: Abner Greenspan, MD as PCP - General Dagoberto Ligas, MD as Referring Physician (Internal Medicine) Webb Laws, OD as Referring Physician (Optometry) Caralee Ates, DDS as Consulting Physician (Dentistry)    Assessment:    Hearing Screening Comments: Bilateral hearing aids Vision Screening Comments: Last vision exam in July 2017  Exercise Activities and Dietary recommendations Current Exercise Habits: Home exercise routine, Type of exercise: stretching, Time (Minutes): 30, Frequency (Times/Week): 7, Weekly Exercise (Minutes/Week): 210, Intensity: Mild, Exercise limited by: neurologic condition(s)  Goals    . Increase physical activity          Starting 08/31/16, I will continue to stretch for at least 30 min daily.       Fall Risk Fall Risk  08/31/2016 08/30/2015 04/27/2014 04/19/2013  Falls in the past year? No No No Yes  Number falls in past yr: - - - 1  Risk for fall due to : - - - Impaired balance/gait  Risk for fall due to (comments): - - - counseling done at last visit-pt declines PT but has started   Depression Screen PHQ 2/9 Scores 08/31/2016 08/30/2015 04/27/2014 04/19/2013  PHQ - 2 Score 0 0 0 0     Cognitive Function MMSE - Mini Mental State Exam 08/31/2016  Orientation to time 5  Orientation to Place 5  Registration 3  Attention/ Calculation 0  Recall 3  Language- name 2 objects 0  Language- repeat 1  Language- follow 3 step command 3  Language-  read & follow direction 0  Write a sentence 0  Copy design 0  Total score 20     PLEASE NOTE: A Mini-Cog screen was completed. Maximum score is 20. A value of 0 denotes this part of Folstein MMSE was not completed or the patient failed this part of the Mini-Cog screening.   Mini-Cog Screening Orientation to Time - Max 5 pts Orientation to Place - Max 5 pts Registration - Max  3 pts Recall - Max 3 pts Language Repeat - Max 1 pts Language Follow 3 Step Command - Max 3 pts     Immunization History  Administered Date(s) Administered  . Influenza Split 05/21/2011, 06/11/2012  . Influenza Whole 05/26/2004, 05/23/2010  . Influenza,inj,Quad PF,36+ Mos 04/17/2013, 04/27/2014, 05/20/2015, 05/22/2016  . Pneumococcal Conjugate-13 04/27/2014  . Pneumococcal Polysaccharide-23 09/26/2005  . Td 05/21/1999, 04/04/2012   Screening Tests Health Maintenance  Topic Date Due  . FOOT EXAM  02/23/2017  . HEMOGLOBIN A1C  02/28/2017  . OPHTHALMOLOGY EXAM  05/02/2017  . MAMMOGRAM  06/13/2017  . TETANUS/TDAP  04/04/2022  . INFLUENZA VACCINE  Addressed  . DEXA SCAN  Completed  . ZOSTAVAX  Addressed  . PNA vac Low Risk Adult  Completed      Plan:     I have personally reviewed and addressed the Medicare Annual Wellness questionnaire and have noted the following in the patient's chart:  A. Medical and social history B. Use of alcohol, tobacco or illicit drugs  C. Current medications and supplements D. Functional ability and status E.  Nutritional status F.  Physical activity G. Advance directives H. List of other physicians I.  Hospitalizations, surgeries, and ER visits in previous 12 months J.  [place] to include hearing, vision, cognitive, depression L. Referrals and appointments - none  In addition, I have reviewed and discussed with patient certain preventive protocols, quality metrics, and best practice recommendations. A written personalized care plan for preventive  services as well as general preventive health recommendations were provided to patient.  See attached scanned questionnaire for additional information.   Signed,   Lindell Noe, MHA, BS, LPN Health Coach

## 2016-08-31 NOTE — Patient Instructions (Signed)
Ms. Arreaga , Thank you for taking time to come for your Medicare Wellness Visit. I appreciate your ongoing commitment to your health goals. Please review the following plan we discussed and let me know if I can assist you in the future.   These are the goals we discussed: Goals    . Increase physical activity          Starting 08/31/16, I will continue to stretch for at least 30 min daily.        This is a list of the screening recommended for you and due dates:  Health Maintenance  Topic Date Due  . Complete foot exam   02/23/2017  . Hemoglobin A1C  02/28/2017  . Eye exam for diabetics  05/02/2017  . Mammogram  06/13/2017  . Tetanus Vaccine  04/04/2022  . Flu Shot  Addressed  . DEXA scan (bone density measurement)  Completed  . Shingles Vaccine  Addressed  . Pneumonia vaccines  Completed   Preventive Care for Adults  A healthy lifestyle and preventive care can promote health and wellness. Preventive health guidelines for adults include the following key practices.  . A routine yearly physical is a good way to check with your health care provider about your health and preventive screening. It is a chance to share any concerns and updates on your health and to receive a thorough exam.  . Visit your dentist for a routine exam and preventive care every 6 months. Brush your teeth twice a day and floss once a day. Good oral hygiene prevents tooth decay and gum disease.  . The frequency of eye exams is based on your age, health, family medical history, use  of contact lenses, and other factors. Follow your health care provider's ecommendations for frequency of eye exams.  . Eat a healthy diet. Foods like vegetables, fruits, whole grains, low-fat dairy products, and lean protein foods contain the nutrients you need without too many calories. Decrease your intake of foods high in solid fats, added sugars, and salt. Eat the right amount of calories for you. Get information about a proper  diet from your health care provider, if necessary.  . Regular physical exercise is one of the most important things you can do for your health. Most adults should get at least 150 minutes of moderate-intensity exercise (any activity that increases your heart rate and causes you to sweat) each week. In addition, most adults need muscle-strengthening exercises on 2 or more days a week.  Silver Sneakers may be a benefit available to you. To determine eligibility, you may visit the website: www.silversneakers.com or contact program at (937)381-7576 Mon-Fri between 8AM-8PM.   . Maintain a healthy weight. The body mass index (BMI) is a screening tool to identify possible weight problems. It provides an estimate of body fat based on height and weight. Your health care provider can find your BMI and can help you achieve or maintain a healthy weight.   For adults 20 years and older: ? A BMI below 18.5 is considered underweight. ? A BMI of 18.5 to 24.9 is normal. ? A BMI of 25 to 29.9 is considered overweight. ? A BMI of 30 and above is considered obese.   . Maintain normal blood lipids and cholesterol levels by exercising and minimizing your intake of saturated fat. Eat a balanced diet with plenty of fruit and vegetables. Blood tests for lipids and cholesterol should begin at age 48 and be repeated every 5 years. If your lipid  or cholesterol levels are high, you are over 50, or you are at high risk for heart disease, you may need your cholesterol levels checked more frequently. Ongoing high lipid and cholesterol levels should be treated with medicines if diet and exercise are not working.  . If you smoke, find out from your health care provider how to quit. If you do not use tobacco, please do not start.  . If you choose to drink alcohol, please do not consume more than 2 drinks per day. One drink is considered to be 12 ounces (355 mL) of beer, 5 ounces (148 mL) of wine, or 1.5 ounces (44 mL) of  liquor.  . If you are 1-65 years old, ask your health care provider if you should take aspirin to prevent strokes.  . Use sunscreen. Apply sunscreen liberally and repeatedly throughout the day. You should seek shade when your shadow is shorter than you. Protect yourself by wearing long sleeves, pants, a wide-brimmed hat, and sunglasses year round, whenever you are outdoors.  . Once a month, do a whole body skin exam, using a mirror to look at the skin on your back. Tell your health care provider of new moles, moles that have irregular borders, moles that are larger than a pencil eraser, or moles that have changed in shape or color.

## 2016-08-31 NOTE — Progress Notes (Signed)
PCP notes:   Health maintenance:  A1C - completed  Abnormal screenings:   None  Patient concerns:   None  Nurse concerns:  None  Next PCP appt:   09/05/16 @ 1030  I reviewed health advisor's note, was available for consultation, and agree with documentation and plan. [name] MD

## 2016-08-31 NOTE — Progress Notes (Signed)
Pre visit review using our clinic review tool, if applicable. No additional management support is needed unless otherwise documented below in the visit note. 

## 2016-09-05 ENCOUNTER — Encounter: Payer: Medicare Other | Admitting: Family Medicine

## 2016-09-05 ENCOUNTER — Other Ambulatory Visit: Payer: Self-pay | Admitting: Family Medicine

## 2016-09-12 ENCOUNTER — Encounter: Payer: Self-pay | Admitting: Family Medicine

## 2016-09-12 ENCOUNTER — Ambulatory Visit (INDEPENDENT_AMBULATORY_CARE_PROVIDER_SITE_OTHER): Payer: Medicare Other | Admitting: Family Medicine

## 2016-09-12 VITALS — BP 114/56 | HR 68 | Temp 97.9°F | Ht 61.5 in | Wt 182.5 lb

## 2016-09-12 DIAGNOSIS — N183 Chronic kidney disease, stage 3 (moderate): Secondary | ICD-10-CM

## 2016-09-12 DIAGNOSIS — I69959 Hemiplegia and hemiparesis following unspecified cerebrovascular disease affecting unspecified side: Secondary | ICD-10-CM | POA: Diagnosis not present

## 2016-09-12 DIAGNOSIS — E1165 Type 2 diabetes mellitus with hyperglycemia: Secondary | ICD-10-CM | POA: Diagnosis not present

## 2016-09-12 DIAGNOSIS — F418 Other specified anxiety disorders: Secondary | ICD-10-CM

## 2016-09-12 DIAGNOSIS — N179 Acute kidney failure, unspecified: Secondary | ICD-10-CM

## 2016-09-12 DIAGNOSIS — E538 Deficiency of other specified B group vitamins: Secondary | ICD-10-CM | POA: Diagnosis not present

## 2016-09-12 DIAGNOSIS — Z7409 Other reduced mobility: Secondary | ICD-10-CM

## 2016-09-12 DIAGNOSIS — E7800 Pure hypercholesterolemia, unspecified: Secondary | ICD-10-CM

## 2016-09-12 DIAGNOSIS — I10 Essential (primary) hypertension: Secondary | ICD-10-CM

## 2016-09-12 DIAGNOSIS — M8589 Other specified disorders of bone density and structure, multiple sites: Secondary | ICD-10-CM | POA: Diagnosis not present

## 2016-09-12 DIAGNOSIS — N289 Disorder of kidney and ureter, unspecified: Secondary | ICD-10-CM

## 2016-09-12 MED ORDER — MIRTAZAPINE 15 MG PO TABS: 7.5000 mg | ORAL_TABLET | Freq: Every day | ORAL | 3 refills | Status: DC

## 2016-09-12 MED ORDER — METOPROLOL SUCCINATE ER 25 MG PO TB24: 12.5000 mg | ORAL_TABLET | Freq: Two times a day (BID) | ORAL | 3 refills | Status: DC

## 2016-09-12 MED ORDER — CYANOCOBALAMIN 1000 MCG/ML IJ SOLN
1000.0000 ug | Freq: Once | INTRAMUSCULAR | Status: AC
  Administered 2016-09-12: 1000 ug via INTRAMUSCULAR

## 2016-09-12 MED ORDER — SERTRALINE HCL 50 MG PO TABS
50.0000 mg | ORAL_TABLET | Freq: Every day | ORAL | 3 refills | Status: DC
Start: 2016-09-12 — End: 2017-11-05

## 2016-09-12 NOTE — Assessment & Plan Note (Signed)
She has not been compliant with monthly B12 shots Lab Results  Component Value Date   VITAMINB12 187 (L) 08/31/2016   Will have shot today  Scheduled next for a month  Also adv she get otc B12 and take 1000 mcg daily - unsure how much she will be able to absorb

## 2016-09-12 NOTE — Assessment & Plan Note (Signed)
Doing very well  Lab Results  Component Value Date   HGBA1C 5.6 08/31/2016   No c/o of hypoglycemia  Low dose glipizide only  Good foot and eye care  Sees renal for multifactorial renal insuff and on ace F/u 6 mo  Enc further good diet and activity

## 2016-09-12 NOTE — Assessment & Plan Note (Addendum)
Stable and well controlled with Sertraline and xanax with mirtazapine for sleep and appetite  Last dose of xanax was last night - due for UDS today  Pt thinks she is doing well  Reviewed stressors/ coping techniques/symptoms/ support sources/ tx options and side effects in detail today Some grief- recent family losses

## 2016-09-12 NOTE — Assessment & Plan Note (Signed)
Handicapped parking form done today  Pt has L hemiparesis from old CVA Uses cane /walker and/or assistance

## 2016-09-12 NOTE — Assessment & Plan Note (Signed)
Disc goals for lipids and reasons to control them Rev labs with pt Rev low sat fat diet in detail   LDL is improved with diet  Trig is stable  Continue lopid and diet

## 2016-09-12 NOTE — Progress Notes (Signed)
Subjective:    Patient ID: Renee [name], female    DOB: 08/28/1928, 83 y.o.   MRN: [id number]  HPI Here for annual f/u of chronic health problems   Feeling pretty good overall  2 deaths in family this week- SIL 25, and husband's aunt   Wt Readings from Last 3 Encounters:  09/12/16 182 lb 8 oz (82.8 kg)  08/31/16 181 lb 12 oz (82.4 kg)  02/24/16 182 lb 12 oz (82.9 kg)  33.9 She felt she gained a little  Walking for exercise - thinks her strength is about the same - tires easily and has to take breaks (does not give up)    Had AMW 1/12 No concerns  Uses hearing aides  Labs done  Eye exam 9/17 Brought her adv directive paper work with her today (she continues to work on living will and has the [employer])   Thinks her L ear may have wax in it- but had it cleaned out last week   Mammogram 10/17 neg with dense breasts  Self breast exam -no lumps   She has had a hysterectomy with BSO for fibroids in the past   dexa 1/17 osteopenia/ stable  No falls in the past year Very careful- cane/ walker if needed and supervised  No fractures  Colonoscopy 12/09 with adenomatous polyp  Has out aged colon screening   bp is stable today  No cp or palpitations or headaches or edema  No side effects to medicines  BP Readings from Last 3 Encounters:  09/12/16 (!) 114/56  08/31/16 126/70  02/24/16 122/60      Hx of past stroke with hemiplegia  She has a caregiver 6 hours per day   Hx of renal insufficiency Lab Results  Component Value Date   CREATININE 1.44 (H) 08/31/2016   BUN 24 (H) 08/31/2016   NA 142 08/31/2016   K 4.6 08/31/2016   CL 108 08/31/2016   CO2 29 08/31/2016  not much change  She makes a bit effort to drink fluids  Sees nephrology   Hx of B12 def Lab Results  Component Value Date   VITAMINB12 187 (L) 08/31/2016  she needs a B12 shot! Not getting B12 shots since the summer  Not taking B12 otc- she plans to get some She is tired    Hx of hyperlipidemia    Lab Results  Component Value Date   CHOL 179 08/31/2016   CHOL 185 08/30/2015   CHOL 195 02/18/2015   Lab Results  Component Value Date   HDL 37.90 (L) 08/31/2016   HDL 33.90 (L) 08/30/2015   HDL 35.40 (L) 02/18/2015   Lab Results  Component Value Date   LDLCALC 124 (H) 02/18/2015   LDLCALC 117 (H) 04/20/2014   LDLCALC 126 (H) 10/21/2013   Lab Results  Component Value Date   TRIG 214.0 (H) 08/31/2016   TRIG 205.0 (H) 08/30/2015   TRIG 176.0 (H) 02/18/2015   Lab Results  Component Value Date   CHOLHDL 5 08/31/2016   CHOLHDL 5 08/30/2015   CHOLHDL 6 02/18/2015   Lab Results  Component Value Date   LDLDIRECT 88.0 08/31/2016   LDLDIRECT 100.0 08/30/2015   LDLDIRECT 120.0 11/09/2014    She takes lopid  [employer] closely for sat and trans fat  LDL is improved!  DM2 Lab Results  Component Value Date   HGBA1C 5.6 08/31/2016  this is very good -down from 5.7  No meds No foot problems (although legs swell  occ)  utd eye exam Has renal protection   Hx of anemia  Lab Results  Component Value Date   WBC 7.0 08/31/2016   HGB 11.1 (L) 08/31/2016   HCT 33.4 (L) 08/31/2016   MCV 85.3 08/31/2016   PLT 175.0 08/31/2016    Now in a better range with iron    Lab Results  Component Value Date   TSH 2.02 08/31/2016     Due for UDS for xanax today  She took last dose at bedtime last night-? If it will be in her system   Patient Active Problem List   Diagnosis Date Noted  . Estrogen deficiency 08/30/2015  . Encounter for Medicare annual wellness exam 04/17/2013  . Mobility impaired 06/26/2011  . History of retinal detachment 01/10/2011  . Sleep apnea 11/28/2010  . Depression with anxiety 08/25/2010  . Hemiplegia, late effect of cerebrovascular disease ([place]) 07/05/2010  . POSTHERPETIC NEURALGIA 11/09/2009  . Osteopenia 12/20/2008  . Renal insufficiency 06/29/2008  . BACK PAIN 01/26/2008  . EDEMA 01/26/2008  . B12 deficiency 01/10/2007  . Diabetes type 2,  controlled ([place]) 11/27/2006  . Hyperlipidemia 11/27/2006  . Essential hypertension 11/27/2006  . FIBROCYSTIC BREAST DISEASE 11/27/2006  . ROSACEA 11/27/2006  . OSTEOARTHRITIS 11/27/2006  . URINARY INCONTINENCE, MIXED 11/27/2006   Past Medical History:  Diagnosis Date  . Angioedema    possibly from voltaren  . Degenerative disc disease   . Diabetes mellitus    type II  . Hyperlipidemia   . Hypertension   . LVH (left ventricular hypertrophy)    and atrial enlargement by echo in past with nl EF  . Nasal pruritis   . Osteoarthritis   . Osteopenia   . Renal insufficiency   . Sleep apnea   . Stroke [facility]) 05/2010   Small vessel sobcortical (in [place] trial) with Dr Leonie Man, residual L hemiparesis  . Vitamin B 12 deficiency 04/08   Past Surgical History:  Procedure Laterality Date  . ABDOMINAL HYSTERECTOMY     BSO-fibroids  . APPENDECTOMY    . BACK SURGERY    . COLON SURGERY     due to punctured intestines  . EYE SURGERY     cataract extraction  . KNEE SURGERY     arthroscope  . PARS PLANA VITRECTOMY  07/31/2011   Procedure: PARS PLANA VITRECTOMY WITH 25 GAUGE;  Surgeon: [name], MD;  Location: [facility];  Service: Ophthalmology;  Laterality: Right;  REMOVAL OF SILICONE OIL AND LASER RIGHT EYE  . RETINAL DETACHMENT SURGERY  02/18/11   times 2  . SPINE SURGERY  08/09   spinal decompression surgery   Social History  Substance Use Topics  . Smoking status: Never Smoker  . Smokeless tobacco: Never Used  . Alcohol use No   Family History  Problem Relation Age of Onset  . COPD Brother   . Cancer Sister     brain tumor with hemmorhage  . Heart disease Sister     CAD   Allergies  Allergen Reactions  . Diclofenac Sodium Other (See Comments)    REACTION: angioedema  . Pioglitazone Swelling   Current Outpatient Prescriptions on File Prior to Visit  Medication Sig Dispense Refill  . acetaminophen (TYLENOL) 500 MG tablet Take 500 mg by mouth 2 (two) times daily.    Marland Kitchen  ALPRAZolam (XANAX) 0.5 MG tablet TAKE 1 TABLET BY MOUTH TWICE A DAY AS NEEDED FOR ANXIETY 30 tablet 1  . amLODipine (NORVASC) 10 MG tablet TAKE  1 TABLET EVERY DAY 90 tablet 3  . aspirin 325 MG tablet Take 325 mg by mouth daily.     . beta carotene w/minerals (OCUVITE) tablet Take 1 tablet by mouth at bedtime.    . calcium-vitamin D (OSCAL WITH D) 500-200 MG-UNIT per tablet Take 1 tablet by mouth daily.     . Cholecalciferol (VITAMIN D) 1000 UNITS capsule Take 1,000 Units by mouth daily.     . cyanocobalamin (,VITAMIN B-12,) 1000 MCG/ML injection Inject 1,000 mcg into the muscle every 30 (thirty) days.     Marland Kitchen docusate sodium (COLACE) 100 MG capsule Take 200 mg by mouth at bedtime.    . ferrous sulfate 325 (65 FE) MG tablet Take 325 mg by mouth 2 (two) times daily.    Marland Kitchen gemfibrozil (LOPID) 600 MG tablet TAKE 1 TABLET EVERY DAY 90 tablet 1  . glipiZIDE (GLUCOTROL XL) 2.5 MG 24 hr tablet Take 1 tablet (2.5 mg total) by mouth daily with breakfast. 30 tablet 11  . guaiFENesin (ROBITUSSIN) 100 MG/5ML SOLN Take 5 mLs by mouth every 4 (four) hours as needed for cough or to loosen phlegm.    . hydrALAZINE (APRESOLINE) 100 MG tablet Take 100 mg by mouth 3 (three) times daily.     . Multiple Vitamin (MULTIVITAMIN) capsule Take 1 capsule by mouth daily.     . trandolapril (MAVIK) 4 MG tablet TAKE 1 TABLET EVERY DAY 90 tablet 3   No current facility-administered medications on file prior to visit.     Review of Systems Review of Systems  Constitutional: Negative for fever, appetite change, fatigue and unexpected weight change.  ENT pos for hearing loss/ wears hearing aides/ occ wax occl Eyes: Negative for pain and visual disturbance.  Respiratory: Negative for cough and shortness of breath.   Cardiovascular: Negative for cp or palpitations    Gastrointestinal: Negative for nausea, diarrhea and constipation.  Genitourinary: Negative for urgency and frequency.  Skin: Negative for pallor or rash     Neurological: Negative for , light-headedness, numbness and headaches. pos for baseline L hemiparesis w/o change as well as slow speech  Hematological: Negative for adenopathy. Does not bruise/bleed easily.  Psychiatric/Behavioral: Negative for dysphoric mood. The patient is not nervous/anxious.        Objective:   Physical Exam  Constitutional: She appears well-developed and well-nourished. No distress.  Frail appearing elderly female with baseline L hemiparesis   HENT:  Head: Normocephalic and atraumatic.  Right Ear: External ear normal.  Left Ear: External ear normal.  Mouth/Throat: Oropharynx is clear and moist.  Baseline facial droop   Speech is slow but intelligible   Eyes: Conjunctivae and EOM are normal. Pupils are equal, round, and reactive to light. No scleral icterus.  Neck: Normal range of motion. Neck supple. No JVD present. Carotid bruit is not present. No thyromegaly present.  Cardiovascular: Normal rate, regular rhythm and intact distal pulses.  Exam reveals no gallop.   Murmur heard. Pulmonary/Chest: Effort normal and breath sounds normal. No respiratory distress. She has no wheezes. She has no rales. She exhibits no tenderness.  Abdominal: Soft. Bowel sounds are normal. She exhibits no distension, no abdominal bruit and no mass. There is no tenderness.  Genitourinary: No breast swelling, tenderness, discharge or bleeding.  Genitourinary Comments: Breast exam: No mass, nodules, thickening, tenderness, bulging, retraction, inflamation, nipple discharge or skin changes noted.  No axillary or clavicular LA.      Musculoskeletal: Normal range of motion. She exhibits no edema  or tenderness.  Mild kyphosis   Lymphadenopathy:    She has no cervical adenopathy.  Neurological: She is alert. She has normal reflexes. No cranial nerve deficit. She exhibits normal muscle tone. Coordination normal.  L hemiparesis baseline with facial droop  Walks well with a cane today for short  distances  Skin: Skin is warm and dry. No rash noted. No erythema. No pallor.  Lentigines diffusely  SKs diffusely   Irritated SK in posterior hair line with scab  Some angiomas on trunk   Psychiatric: She has a normal mood and affect.  Pleasant /mood is good           Assessment & Plan:   Problem List Items Addressed This Visit      Cardiovascular and Mediastinum   Essential hypertension - Primary    bp in fair control at this time  BP Readings from Last 1 Encounters:  09/12/16 (!) 114/56   No changes needed Disc lifstyle change with low sodium diet and exercise  Labs reviewed  No c/o of hypotension  Vascular events       Relevant Medications   metoprolol succinate (TOPROL-XL) 25 MG 24 hr tablet     Endocrine   Diabetes type 2, controlled ([place])    Doing very well  Lab Results  Component Value Date   HGBA1C 5.6 08/31/2016   No c/o of hypoglycemia  Low dose glipizide only  Good foot and eye care  Sees renal for multifactorial renal insuff and on ace F/u 6 mo  Enc further good diet and activity        Nervous and Auditory   Hemiplegia, late effect of cerebrovascular disease ([place])    With L sided hemiparesis that has stabilized- she is able to ambulate with cane and assistance  She tries to stay as active as possible and this helps strength  No longer in PT Has 6 hour per day aide as well  No new neurovascular changes         Musculoskeletal and Integument   Osteopenia    Stable dexa 1/17  Disc need for calcium/ vitamin D/ wt bearing exercise and bone density test every 2 y to monitor Disc safety/ fracture risk in detail          Genitourinary   RESOLVED: Acute renal failure superimposed on stage 3 chronic kidney disease ([place])   Renal insufficiency    Lab Results  Component Value Date   CREATININE 1.44 (H) 08/31/2016   Has been stable  Enc fluids as always  Continues nephrology f/u  On ace          Other   B12 deficiency    She has  not been compliant with monthly B12 shots Lab Results  Component Value Date   VITAMINB12 187 (L) 08/31/2016   Will have shot today  Scheduled next for a month  Also adv she get otc B12 and take 1000 mcg daily - unsure how much she will be able to absorb      Relevant Medications   cyanocobalamin ((VITAMIN B-12)) injection 1,000 mcg (Completed)   Depression with anxiety    Stable and well controlled with Sertraline and xanax with mirtazapine for sleep and appetite  Last dose of xanax was last night - due for UDS today  Pt thinks she is doing well  Reviewed stressors/ coping techniques/symptoms/ support sources/ tx options and side effects in detail today Some grief- recent family losses  Hyperlipidemia    Disc goals for lipids and reasons to control them Rev labs with pt Rev low sat fat diet in detail   LDL is improved with diet  Trig is stable  Continue lopid and diet       Relevant Medications   metoprolol succinate (TOPROL-XL) 25 MG 24 hr tablet   Mobility impaired    Handicapped parking form done today  Pt has L hemiparesis from old CVA Uses cane Gilford Rile and/or assistance

## 2016-09-12 NOTE — Assessment & Plan Note (Signed)
Lab Results  Component Value Date   CREATININE 1.44 (H) 08/31/2016   Has been stable  Enc fluids as always  Continues nephrology f/u  On ace

## 2016-09-12 NOTE — Patient Instructions (Addendum)
Please start coming for B12 shot every 1-2 month In addition to that buy B12 supplement over the counter and take 1000 mcg every day with food    Ears look clear   B12 shot today   Urine drug test today for xanax   Take care of yourself  Stay active   Follow up with me in 6 months

## 2016-09-12 NOTE — Progress Notes (Signed)
Pre visit review using our clinic review tool, if applicable. No additional management support is needed unless otherwise documented below in the visit note. 

## 2016-09-12 NOTE — Assessment & Plan Note (Signed)
bp in fair control at this time  BP Readings from Last 1 Encounters:  09/12/16 (!) 114/56   No changes needed Disc lifstyle change with low sodium diet and exercise  Labs reviewed  No c/o of hypotension  Vascular events

## 2016-09-12 NOTE — Assessment & Plan Note (Signed)
With L sided hemiparesis that has stabilized- she is able to ambulate with cane and assistance  She tries to stay as active as possible and this helps strength  No longer in PT Has 6 hour per day aide as well  No new neurovascular changes

## 2016-09-12 NOTE — Assessment & Plan Note (Signed)
Stable dexa 1/17  Disc need for calcium/ vitamin D/ wt bearing exercise and bone density test every 2 y to monitor Disc safety/ fracture risk in detail

## 2016-09-14 ENCOUNTER — Encounter: Payer: Self-pay | Admitting: Family Medicine

## 2016-09-14 DIAGNOSIS — Z79899 Other long term (current) drug therapy: Secondary | ICD-10-CM | POA: Diagnosis not present

## 2016-10-04 ENCOUNTER — Other Ambulatory Visit: Payer: Self-pay | Admitting: Family Medicine

## 2016-10-05 NOTE — Telephone Encounter (Signed)
F/u scheduled on 03/12/17, last filled on  08/10/16 #30 tabs with 1 additional refill, please advise

## 2016-10-05 NOTE — Telephone Encounter (Signed)
Px written for call in   

## 2016-10-05 NOTE — Telephone Encounter (Signed)
Rx called in as prescribed 

## 2016-10-16 ENCOUNTER — Ambulatory Visit (INDEPENDENT_AMBULATORY_CARE_PROVIDER_SITE_OTHER): Payer: Medicare Other

## 2016-10-16 DIAGNOSIS — E538 Deficiency of other specified B group vitamins: Secondary | ICD-10-CM

## 2016-10-16 MED ORDER — CYANOCOBALAMIN 1000 MCG/ML IJ SOLN
1000.0000 ug | Freq: Once | INTRAMUSCULAR | Status: AC
  Administered 2016-10-16: 1000 ug via INTRAMUSCULAR

## 2016-11-21 ENCOUNTER — Ambulatory Visit (INDEPENDENT_AMBULATORY_CARE_PROVIDER_SITE_OTHER): Payer: Medicare Other

## 2016-11-21 DIAGNOSIS — E538 Deficiency of other specified B group vitamins: Secondary | ICD-10-CM

## 2016-11-21 MED ORDER — CYANOCOBALAMIN 1000 MCG/ML IJ SOLN
1000.0000 ug | Freq: Once | INTRAMUSCULAR | Status: AC
  Administered 2016-11-21: 1000 ug via INTRAMUSCULAR

## 2016-11-27 DIAGNOSIS — R809 Proteinuria, unspecified: Secondary | ICD-10-CM | POA: Diagnosis not present

## 2016-11-27 DIAGNOSIS — N183 Chronic kidney disease, stage 3 (moderate): Secondary | ICD-10-CM | POA: Diagnosis not present

## 2016-11-27 DIAGNOSIS — I10 Essential (primary) hypertension: Secondary | ICD-10-CM | POA: Diagnosis not present

## 2016-12-05 ENCOUNTER — Other Ambulatory Visit: Payer: Self-pay | Admitting: Primary Care

## 2016-12-05 ENCOUNTER — Emergency Department (HOSPITAL_COMMUNITY): Payer: Medicare Other

## 2016-12-05 ENCOUNTER — Emergency Department (HOSPITAL_COMMUNITY)
Admission: EM | Admit: 2016-12-05 | Discharge: 2016-12-05 | Disposition: A | Discharge status: Home / Self Care | Payer: Medicare Other | Attending: Emergency Medicine | Admitting: Emergency Medicine

## 2016-12-05 ENCOUNTER — Encounter (HOSPITAL_COMMUNITY): Payer: Self-pay | Admitting: Emergency Medicine

## 2016-12-05 DIAGNOSIS — R0602 Shortness of breath: Secondary | ICD-10-CM

## 2016-12-05 DIAGNOSIS — I10 Essential (primary) hypertension: Secondary | ICD-10-CM | POA: Diagnosis not present

## 2016-12-05 DIAGNOSIS — R062 Wheezing: Secondary | ICD-10-CM

## 2016-12-05 DIAGNOSIS — J069 Acute upper respiratory infection, unspecified: Secondary | ICD-10-CM | POA: Diagnosis not present

## 2016-12-05 DIAGNOSIS — Z79899 Other long term (current) drug therapy: Secondary | ICD-10-CM | POA: Insufficient documentation

## 2016-12-05 DIAGNOSIS — E119 Type 2 diabetes mellitus without complications: Secondary | ICD-10-CM | POA: Insufficient documentation

## 2016-12-05 DIAGNOSIS — Z8673 Personal history of transient ischemic attack (TIA), and cerebral infarction without residual deficits: Secondary | ICD-10-CM | POA: Insufficient documentation

## 2016-12-05 DIAGNOSIS — Z7982 Long term (current) use of aspirin: Secondary | ICD-10-CM | POA: Insufficient documentation

## 2016-12-05 DIAGNOSIS — R05 Cough: Secondary | ICD-10-CM | POA: Diagnosis not present

## 2016-12-05 DIAGNOSIS — R069 Unspecified abnormalities of breathing: Secondary | ICD-10-CM | POA: Diagnosis not present

## 2016-12-05 LAB — CBC WITH DIFFERENTIAL/PLATELET
Basophils Absolute: 0 10*3/uL (ref 0.0–0.1)
Basophils Relative: 1 %
Eosinophils Absolute: 0.1 10*3/uL (ref 0.0–0.7)
Eosinophils Relative: 1 %
HCT: 38.1 % (ref 36.0–46.0)
Hemoglobin: 12.2 g/dL (ref 12.0–15.0)
Lymphocytes Relative: 21 %
Lymphs Abs: 1.1 10*3/uL (ref 0.7–4.0)
MCH: 28.6 pg (ref 26.0–34.0)
MCHC: 32 g/dL (ref 30.0–36.0)
MCV: 89.2 fL (ref 78.0–100.0)
Monocytes Absolute: 0.4 10*3/uL (ref 0.1–1.0)
Monocytes Relative: 7 %
Neutro Abs: 3.4 10*3/uL (ref 1.7–7.7)
Neutrophils Relative %: 70 %
Platelets: 165 10*3/uL (ref 150–400)
RBC: 4.27 MIL/uL (ref 3.87–5.11)
RDW: 14.3 % (ref 11.5–15.5)
WBC: 5 10*3/uL (ref 4.0–10.5)

## 2016-12-05 LAB — I-STAT CG4 LACTIC ACID, ED: Lactic Acid, Venous: 1.18 mmol/L (ref 0.5–1.9)

## 2016-12-05 LAB — INFLUENZA PANEL BY PCR (TYPE A & B)
Influenza A By PCR: NEGATIVE
Influenza B By PCR: NEGATIVE

## 2016-12-05 LAB — URINALYSIS, ROUTINE W REFLEX MICROSCOPIC
Bilirubin Urine: NEGATIVE
Glucose, UA: NEGATIVE mg/dL
Hgb urine dipstick: NEGATIVE
Ketones, ur: NEGATIVE mg/dL
Nitrite: NEGATIVE
Protein, ur: NEGATIVE mg/dL
Specific Gravity, Urine: 1.016 (ref 1.005–1.030)
pH: 5 (ref 5.0–8.0)

## 2016-12-05 LAB — BASIC METABOLIC PANEL
Anion gap: 11 (ref 5–15)
BUN: 19 mg/dL (ref 6–20)
CO2: 23 mmol/L (ref 22–32)
Calcium: 9 mg/dL (ref 8.9–10.3)
Chloride: 105 mmol/L (ref 101–111)
Creatinine, Ser: 1.36 mg/dL — ABNORMAL HIGH (ref 0.44–1.00)
GFR calc Af Amer: 40 mL/min — ABNORMAL LOW (ref >60)
GFR calc non Af Amer: 34 mL/min — ABNORMAL LOW (ref >60)
Glucose, Bld: 173 mg/dL — ABNORMAL HIGH (ref 65–99)
Potassium: 4 mmol/L (ref 3.5–5.1)
Sodium: 139 mmol/L (ref 135–145)

## 2016-12-05 LAB — TROPONIN I: Troponin I: <0.03 ng/mL (ref <0.03)

## 2016-12-05 LAB — BRAIN NATRIURETIC PEPTIDE: B Natriuretic Peptide: 66.9 pg/mL (ref 0.0–100.0)

## 2016-12-05 NOTE — ED Notes (Signed)
Patient transported to X-ray 

## 2016-12-05 NOTE — Discharge Instructions (Signed)
We saw you in the ER for the shortness of breath. All of our workup is normal, including labs, EKG and chest X-RAY are normal. We are not sure what is causing your discomfort, but we feel comfortable sending you home at this time. The workup in the ER is not complete, and you should follow up with your primary care doctor for further evaluation.  We suspect that you have a viral infection, and anticipate that you will improve overtime. However, if there is increased shortness of breath, weakness, confusion - return to the ER immediately.

## 2016-12-05 NOTE — ED Provider Notes (Signed)
[facility] DEPT Provider Note   CSN: [id number] Arrival date & time: 12/05/16  3299     History   Chief Complaint Chief Complaint  Patient presents with  . Shortness of Breath    HPI Renee [name] is a 83 y.o. female.  HPI Pt with hx of DM, stroke, HTN, HL comes in w/ cc of dib. Pt reports that she started having URI like symptoms on Monday with cough, congestion, sneezing. Pt felt like she got better last night, but she woke up from her sleep with dib. Pt reports that she was wheezing. Pt reports that sitting up helped her a little bit. Pt reports that her cough is productive with yellow phlegm. PT has no fevers.   Pt has no CAD, CHF. Pt has no hx of PE, DVT and denies any exogenous hormone (testosterone / estrogen) use, long distance travels or surgery in the past 6 weeks, active cancer, recent immobilization. Pt has leg swelling as well - it is not new.   Past Medical History:  Diagnosis Date  . Angioedema    possibly from voltaren  . Degenerative disc disease   . Diabetes mellitus    type II  . Hyperlipidemia   . Hypertension   . LVH (left ventricular hypertrophy)    and atrial enlargement by echo in past with nl EF  . Nasal pruritis   . Osteoarthritis   . Osteopenia   . Renal insufficiency   . Sleep apnea   . Stroke [facility]) 05/2010   Small vessel sobcortical (in [place] trial) with Dr Leonie Man, residual L hemiparesis  . Vitamin B 12 deficiency 04/08    Patient Active Problem List   Diagnosis Date Noted  . Estrogen deficiency 08/30/2015  . Encounter for Medicare annual wellness exam 04/17/2013  . Mobility impaired 06/26/2011  . History of retinal detachment 01/10/2011  . Sleep apnea 11/28/2010  . Depression with anxiety 08/25/2010  . Hemiplegia, late effect of cerebrovascular disease ([place]) 07/05/2010  . POSTHERPETIC NEURALGIA 11/09/2009  . Osteopenia 12/20/2008  . Renal insufficiency 06/29/2008  . BACK PAIN 01/26/2008  . EDEMA 01/26/2008  . B12 deficiency  01/10/2007  . Diabetes type 2, controlled ([place]) 11/27/2006  . Hyperlipidemia 11/27/2006  . Essential hypertension 11/27/2006  . FIBROCYSTIC BREAST DISEASE 11/27/2006  . ROSACEA 11/27/2006  . OSTEOARTHRITIS 11/27/2006  . URINARY INCONTINENCE, MIXED 11/27/2006    Past Surgical History:  Procedure Laterality Date  . ABDOMINAL HYSTERECTOMY     BSO-fibroids  . APPENDECTOMY    . BACK SURGERY    . COLON SURGERY     due to punctured intestines  . EYE SURGERY     cataract extraction  . KNEE SURGERY     arthroscope  . PARS PLANA VITRECTOMY  07/31/2011   Procedure: PARS PLANA VITRECTOMY WITH 25 GAUGE;  Surgeon: [name], MD;  Location: [place] [facility];  Service: Ophthalmology;  Laterality: Right;  REMOVAL OF SILICONE OIL AND LASER RIGHT EYE  . RETINAL DETACHMENT SURGERY  02/18/11   times 2  . SPINE SURGERY  08/09   spinal decompression surgery    OB History    No data available       Home Medications    Prior to Admission medications   Medication Sig Start Date End Date Taking? Authorizing Provider  acetaminophen (TYLENOL) 500 MG tablet Take 500 mg by mouth 2 (two) times daily.    Historical Provider, MD  ALPRAZolam (XANAX) 0.5 MG tablet TAKE 1 TABLET BY MOUTH TWICE  A DAY AS NEEDED FOR ANXIETY 10/05/16   Abner Greenspan, MD  amLODipine (NORVASC) 10 MG tablet TAKE 1 TABLET EVERY DAY 03/08/16   Abner Greenspan, MD  aspirin 325 MG tablet Take 325 mg by mouth daily.     Historical Provider, MD  beta carotene w/minerals (OCUVITE) tablet Take 1 tablet by mouth at bedtime.    Historical Provider, MD  calcium-vitamin D (OSCAL WITH D) 500-200 MG-UNIT per tablet Take 1 tablet by mouth daily.     Historical Provider, MD  Cholecalciferol (VITAMIN D) 1000 UNITS capsule Take 1,000 Units by mouth daily.     Historical Provider, MD  cyanocobalamin (,VITAMIN B-12,) 1000 MCG/ML injection Inject 1,000 mcg into the muscle every 30 (thirty) days.     Historical Provider, MD  docusate sodium (COLACE) 100 MG  capsule Take 200 mg by mouth at bedtime.    Historical Provider, MD  ferrous sulfate 325 (65 FE) MG tablet Take 325 mg by mouth 2 (two) times daily.    Historical Provider, MD  gemfibrozil (LOPID) 600 MG tablet TAKE 1 TABLET EVERY DAY 08/17/16   Abner Greenspan, MD  glipiZIDE (GLUCOTROL XL) 2.5 MG 24 hr tablet Take 1 tablet (2.5 mg total) by mouth daily with breakfast. 12/22/15   Abner Greenspan, MD  guaiFENesin (ROBITUSSIN) 100 MG/5ML SOLN Take 5 mLs by mouth every 4 (four) hours as needed for cough or to loosen phlegm.    Historical Provider, MD  hydrALAZINE (APRESOLINE) 100 MG tablet Take 100 mg by mouth 3 (three) times daily.  08/30/14   Historical Provider, MD  metoprolol succinate (TOPROL-XL) 25 MG 24 hr tablet Take 0.5 tablets (12.5 mg total) by mouth 2 (two) times daily. 09/12/16   Abner Greenspan, MD  mirtazapine (REMERON) 15 MG tablet Take 0.5 tablets (7.5 mg total) by mouth at bedtime. 09/12/16   Abner Greenspan, MD  Multiple Vitamin (MULTIVITAMIN) capsule Take 1 capsule by mouth daily.     Historical Provider, MD  sertraline (ZOLOFT) 50 MG tablet Take 1 tablet (50 mg total) by mouth daily. 09/12/16   Abner Greenspan, MD  trandolapril ([place]) 4 MG tablet TAKE 1 TABLET EVERY DAY 03/08/16   Abner Greenspan, MD    Family History Family History  Problem Relation Age of Onset  . COPD Brother   . Cancer Sister     brain tumor with hemmorhage  . Heart disease Sister     CAD    Social History Social History  Substance Use Topics  . Smoking status: Never Smoker  . Smokeless tobacco: Never Used  . Alcohol use No     Allergies   Diclofenac sodium and Pioglitazone   Review of Systems Review of Systems  All other systems reviewed and are negative.    Physical Exam Updated Vital Signs BP (!) 176/73   Pulse 81   Resp 14   SpO2 98%   Physical Exam  Constitutional: She is oriented to person, place, and time. She appears well-developed and well-nourished.  HENT:  Head: Normocephalic and  atraumatic.  Eyes: EOM are normal. Pupils are equal, round, and reactive to light.  Neck: Neck supple. No JVD present.  Cardiovascular: Normal rate, regular rhythm and normal heart sounds.   No murmur heard. Pulmonary/Chest: Effort normal. No respiratory distress.  Abdominal: Soft. She exhibits no distension. There is no tenderness. There is no rebound and no guarding.  Musculoskeletal: She exhibits edema. She exhibits no tenderness.  Neurological: She  is alert and oriented to person, place, and time.  Skin: Skin is warm and dry.  Nursing note and vitals reviewed.    ED Treatments / Results  Labs (all labs ordered are listed, but only abnormal results are displayed) Labs Reviewed  BASIC METABOLIC PANEL - Abnormal; Notable for the following:       Result Value   Glucose, Bld 173 (*)    Creatinine, Ser 1.36 (*)    GFR calc non Af Amer 34 (*)    GFR calc Af Amer 40 (*)    All other components within normal limits  CBC WITH DIFFERENTIAL/PLATELET  BRAIN NATRIURETIC PEPTIDE  TROPONIN I  URINALYSIS, ROUTINE W REFLEX MICROSCOPIC  INFLUENZA PANEL BY PCR (TYPE A & B)  I-STAT CG4 LACTIC ACID, ED    EKG  EKG Interpretation  Date/Time:  Wednesday December 05 2016 05:56:45 EDT Ventricular Rate:  81 PR Interval:    QRS Duration: 101 QT Interval:  415 QTC Calculation: 482 R Axis:   -86 Text Interpretation:  Sinus rhythm LAD, consider left anterior fascicular block Low voltage, precordial leads Consider anterior infarct No acute changes Confirmed by Kathrynn Humble, MD, Thelma Comp [phone]) on 12/05/2016 6:42:22 AM       Radiology Dg Chest 2 View  Result Date: 12/05/2016 CLINICAL DATA:  Cough and shortness of breath for several days EXAM: CHEST  2 VIEW COMPARISON:  11/21/2015 FINDINGS: Cardiac shadow is enlarged but accentuated by the frontal technique. The lungs are well aerated bilaterally. No focal infiltrate or sizable effusion is seen. Elevation of the right hemidiaphragm is again noted. No bony  abnormality is noted. IMPRESSION: No acute abnormality seen. Electronically Signed   By: [name] M.D.   On: 12/05/2016 07:09    Procedures Procedures (including critical care time)  Medications Ordered in ED Medications - No data to display   Initial Impression / Assessment and Plan / ED Course  I have reviewed the triage vital signs and the nursing notes.  Pertinent labs & imaging results that were available during my care of the patient were reviewed by me and considered in my medical decision making (see chart for details).  Clinical Course as of Dec 06 843  Wed Dec 05, 2016  0844 Results from the ER workup discussed with the patient face to face and all questions answered to the best of my ability.  Pt ambulated w/o any problems and she is feeling well. Sister at bedside, she lives nearby.  Pt is comfortable going home. We will run the flu swab. Pt will be discharged now, and we will f/u on the flu results.  [AN]    Clinical Course User Index [AN] Varney Biles, MD    Pt comes in with cc of dib. Pt had some URI like symptoms over the past 2-3 days, but then had dib and wheezing last night. Pt was given nebs en route - and there is no wheezing one exam. Pt's exam shows no focal lung abnormality and CXR is clean.   Final Clinical Impressions(s) / ED Diagnoses   Final diagnoses:  Wheezing  Shortness of breath  Acute URI    New Prescriptions New Prescriptions   No medications on file     Varney Biles, MD 12/05/16 [phone]

## 2016-12-05 NOTE — ED Notes (Signed)
Patient at xray

## 2016-12-05 NOTE — ED Notes (Signed)
Ambulated pt in hallway while checking O2 Pt's O2 started at 97% while walking, pt's O2 dropped down to 95% but remained at 97%. Pt also was able to provide urine sample. Pt back in bed and on monitor at this time.

## 2016-12-05 NOTE — ED Triage Notes (Signed)
Per EMS, pt from home with c/o shortness of breath beginning at 0200 this am. Upon EMS arrival, pt had expiratory wheezing to right lower lobe, 5mg  albuterol given, wheezing resolved.  EMS vitals: BP-153/89, SpO2-

## 2016-12-06 NOTE — Telephone Encounter (Signed)
Please check in with pt to see if she still needs it and if she is using it.  If so-refill 6 mo  Thanks

## 2016-12-06 NOTE — Telephone Encounter (Signed)
Ok to refill? Electronically refill request for albuterol (PROVENTIL HFA;VENTOLIN HFA) 108 (90 Base) MCG/ACT inhaler. Last prescribed by [name] on 10/07/2015. Dr Marliss Coots patient. However, it was discontinued by Louellen Molder, MD.

## 2016-12-07 ENCOUNTER — Ambulatory Visit (INDEPENDENT_AMBULATORY_CARE_PROVIDER_SITE_OTHER): Payer: Medicare Other | Admitting: Family Medicine

## 2016-12-07 ENCOUNTER — Encounter: Payer: Self-pay | Admitting: Family Medicine

## 2016-12-07 VITALS — BP 136/74 | HR 76 | Temp 97.7°F | Ht 61.5 in | Wt 180.2 lb

## 2016-12-07 DIAGNOSIS — J069 Acute upper respiratory infection, unspecified: Secondary | ICD-10-CM | POA: Diagnosis not present

## 2016-12-07 DIAGNOSIS — B9789 Other viral agents as the cause of diseases classified elsewhere: Secondary | ICD-10-CM

## 2016-12-07 MED ORDER — ALBUTEROL SULFATE HFA 108 (90 BASE) MCG/ACT IN AERS
2.0000 | INHALATION_SPRAY | RESPIRATORY_TRACT | 3 refills | Status: DC | PRN
Start: 2016-12-07 — End: 2019-07-13

## 2016-12-07 NOTE — Progress Notes (Signed)
Pre visit review using our clinic review tool, if applicable. No additional management support is needed unless otherwise documented below in the visit note. 

## 2016-12-07 NOTE — Assessment & Plan Note (Signed)
Was seen in ED on 4/18 Reviewed hospital records, lab results and studies in detail   Much improved today Re assuring  Enc fluids for renal insuff She will continue symptomatic care  Refilled her albuterol hfa inhaler for prn use  Update if not starting to improve in a week or if worsening

## 2016-12-07 NOTE — Progress Notes (Signed)
Subjective:    Patient ID: Renee [name], female    DOB: 14-Dec-1928, 83 y.o.   MRN: [id number]  HPI Here for hospital f/u of uri ED visit on 4/18 for sob  She presented with sob and wheezing with cough and congestion  Her pulse ox was 98% -not hypoxic  Results for orders placed or performed during the hospital encounter of 12/05/16  CBC WITH DIFFERENTIAL  Result Value Ref Range   WBC 5.0 4.0 - 10.5 K/uL   RBC 4.27 3.87 - 5.11 MIL/uL   Hemoglobin 12.2 12.0 - 15.0 g/dL   HCT 38.1 36.0 - 46.0 %   MCV 89.2 78.0 - 100.0 fL   MCH 28.6 26.0 - 34.0 pg   MCHC 32.0 30.0 - 36.0 g/dL   RDW 14.3 11.5 - 15.5 %   Platelets 165 150 - 400 K/uL   Neutrophils Relative % 70 %   Neutro Abs 3.4 1.7 - 7.7 K/uL   Lymphocytes Relative 21 %   Lymphs Abs 1.1 0.7 - 4.0 K/uL   Monocytes Relative 7 %   Monocytes Absolute 0.4 0.1 - 1.0 K/uL   Eosinophils Relative 1 %   Eosinophils Absolute 0.1 0.0 - 0.7 K/uL   Basophils Relative 1 %   Basophils Absolute 0.0 0.0 - 0.1 K/uL  Basic metabolic panel  Result Value Ref Range   Sodium 139 135 - 145 mmol/L   Potassium 4.0 3.5 - 5.1 mmol/L   Chloride 105 101 - 111 mmol/L   CO2 23 22 - 32 mmol/L   Glucose, Bld 173 (H) 65 - 99 mg/dL   BUN 19 6 - 20 mg/dL   Creatinine, Ser 1.36 (H) 0.44 - 1.00 mg/dL   Calcium 9.0 8.9 - 10.3 mg/dL   GFR calc non Af Amer 34 (L) >60 mL/min   GFR calc Af Amer 40 (L) >60 mL/min   Anion gap 11 5 - 15  Brain natriuretic peptide  Result Value Ref Range   B Natriuretic Peptide 66.9 0.0 - 100.0 pg/mL  Troponin I  Result Value Ref Range   Troponin I <0.03 <0.03 ng/mL  Urinalysis, Routine w reflex microscopic  Result Value Ref Range   Color, Urine YELLOW YELLOW   APPearance HAZY (A) CLEAR   Specific Gravity, Urine 1.016 1.005 - 1.030   pH 5.0 5.0 - 8.0   Glucose, UA NEGATIVE NEGATIVE mg/dL   Hgb urine dipstick NEGATIVE NEGATIVE   Bilirubin Urine NEGATIVE NEGATIVE   Ketones, ur NEGATIVE NEGATIVE mg/dL   Protein, ur NEGATIVE  NEGATIVE mg/dL   Nitrite NEGATIVE NEGATIVE   Leukocytes, UA LARGE (A) NEGATIVE   RBC / HPF 0-5 0 - 5 RBC/hpf   WBC, UA TOO NUMEROUS TO COUNT 0 - 5 WBC/hpf   Bacteria, UA MANY (A) NONE SEEN   Squamous Epithelial / LPF 0-5 (A) NONE SEEN  Influenza panel by PCR (type A & B)  Result Value Ref Range   Influenza A By PCR NEGATIVE NEGATIVE   Influenza B By PCR NEGATIVE NEGATIVE  I-Stat CG4 Lactic Acid, ED  Result Value Ref Range   Lactic Acid, Venous 1.18 0.5 - 1.9 mmol/L    Neg flu screen  Nl BNP No acute changes on her EKG  Clear cxr Dg Chest 2 View  Result Date: 12/05/2016 CLINICAL DATA:  Cough and shortness of breath for several days EXAM: CHEST  2 VIEW COMPARISON:  11/21/2015 FINDINGS: Cardiac shadow is enlarged but accentuated by the frontal technique. The lungs  are well aerated bilaterally. No focal infiltrate or sizable effusion is seen. Elevation of the right hemidiaphragm is again noted. No bony abnormality is noted. IMPRESSION: No acute abnormality seen. Electronically Signed   By: [name] M.D.   On: 12/05/2016 07:09   Today pulse ox is 94% after ambulation   BP: 136/74  Temp: 97.7 F (36.5 C)   diag with viral uri  Much improved now  Cough mild and no longer sob   otc med chlorcedin D guaifenesin  Overall helping a lot  Wheezing is improved   Albuterol inhaler- heeds a refill - using less than she was  Patient Active Problem List   Diagnosis Date Noted  . Viral URI with cough 12/07/2016  . Estrogen deficiency 08/30/2015  . Encounter for Medicare annual wellness exam 04/17/2013  . Mobility impaired 06/26/2011  . History of retinal detachment 01/10/2011  . Sleep apnea 11/28/2010  . Depression with anxiety 08/25/2010  . Hemiplegia, late effect of cerebrovascular disease ([place]) 07/05/2010  . POSTHERPETIC NEURALGIA 11/09/2009  . Osteopenia 12/20/2008  . Renal insufficiency 06/29/2008  . BACK PAIN 01/26/2008  . EDEMA 01/26/2008  . B12 deficiency 01/10/2007    . Diabetes type 2, controlled ([place]) 11/27/2006  . Hyperlipidemia 11/27/2006  . Essential hypertension 11/27/2006  . FIBROCYSTIC BREAST DISEASE 11/27/2006  . ROSACEA 11/27/2006  . OSTEOARTHRITIS 11/27/2006  . URINARY INCONTINENCE, MIXED 11/27/2006   Past Medical History:  Diagnosis Date  . Angioedema    possibly from voltaren  . Degenerative disc disease   . Diabetes mellitus    type II  . Hyperlipidemia   . Hypertension   . LVH (left ventricular hypertrophy)    and atrial enlargement by echo in past with nl EF  . Nasal pruritis   . Osteoarthritis   . Osteopenia   . Renal insufficiency   . Sleep apnea   . Stroke [facility]) 05/2010   Small vessel sobcortical (in [place]) with Dr Leonie Man, residual L hemiparesis  . Vitamin B 12 deficiency 04/08   Past Surgical History:  Procedure Laterality Date  . ABDOMINAL HYSTERECTOMY     BSO-fibroids  . APPENDECTOMY    . BACK SURGERY    . COLON SURGERY     due to punctured intestines  . EYE SURGERY     cataract extraction  . KNEE SURGERY     arthroscope  . PARS PLANA VITRECTOMY  07/31/2011   Procedure: PARS PLANA VITRECTOMY WITH 25 GAUGE;  Surgeon: [name], MD;  Location: [facility];  Service: Ophthalmology;  Laterality: Right;  REMOVAL OF SILICONE OIL AND LASER RIGHT EYE  . RETINAL DETACHMENT SURGERY  02/18/11   times 2  . SPINE SURGERY  08/09   spinal decompression surgery   Social History  Substance Use Topics  . Smoking status: Never Smoker  . Smokeless tobacco: Never Used  . Alcohol use No   Family History  Problem Relation Age of Onset  . COPD Brother   . Cancer Sister     brain tumor with hemmorhage  . Heart disease Sister     CAD   Allergies  Allergen Reactions  . Diclofenac Sodium Other (See Comments)    REACTION: angioedema  . Pioglitazone Swelling   Current Outpatient Prescriptions on File Prior to Visit  Medication Sig Dispense Refill  . acetaminophen (TYLENOL) 500 MG tablet Take 500 mg by mouth at  bedtime.     . ALPRAZolam (XANAX) 0.5 MG tablet TAKE 1 TABLET BY MOUTH  TWICE A DAY AS NEEDED FOR ANXIETY 30 tablet 3  . amLODipine (NORVASC) 10 MG tablet TAKE 1 TABLET EVERY DAY 90 tablet 3  . aspirin 325 MG tablet Take 325 mg by mouth daily.     . beta carotene w/minerals (OCUVITE) tablet Take 1 tablet by mouth 2 (two) times daily.     . calcium-vitamin D (OSCAL WITH D) 500-200 MG-UNIT per tablet Take 1 tablet by mouth daily.     . Chlorphen-Phenyleph-APAP (CORICIDIN D COLD/FLU/SINUS) 2-5-325 MG TABS Take 1 tablet by mouth daily as needed (cold symptoms).    . Cholecalciferol (VITAMIN D) 1000 UNITS capsule Take 1,000 Units by mouth daily.     . cyanocobalamin (,VITAMIN B-12,) 1000 MCG/ML injection Inject 1,000 mcg into the muscle every 30 (thirty) days.     Marland Kitchen docusate sodium (COLACE) 100 MG capsule Take 200 mg by mouth at bedtime.    . ferrous sulfate 325 (65 FE) MG tablet Take 325 mg by mouth 2 (two) times daily.    Marland Kitchen gemfibrozil (LOPID) 600 MG tablet TAKE 1 TABLET EVERY DAY 90 tablet 1  . glipiZIDE (GLUCOTROL XL) 2.5 MG 24 hr tablet Take 1 tablet (2.5 mg total) by mouth daily with breakfast. 30 tablet 11  . guaiFENesin (ROBITUSSIN) 100 MG/5ML SOLN Take 5 mLs by mouth every 4 (four) hours as needed for cough or to loosen phlegm.    . hydrALAZINE (APRESOLINE) 100 MG tablet Take 100 mg by mouth 3 (three) times daily.     . metoprolol succinate (TOPROL-XL) 25 MG 24 hr tablet Take 0.5 tablets (12.5 mg total) by mouth 2 (two) times daily. 90 tablet 3  . mirtazapine (REMERON) 15 MG tablet Take 0.5 tablets (7.5 mg total) by mouth at bedtime. 45 tablet 3  . Multiple Vitamin (MULTIVITAMIN) capsule Take 1 capsule by mouth daily.     . Polyvinyl Alcohol-Povidone (REFRESH OP) Place 1 drop into both eyes daily as needed (dry eyes).    . sertraline (ZOLOFT) 50 MG tablet Take 1 tablet (50 mg total) by mouth daily. 90 tablet 3  . trandolapril (MAVIK) 4 MG tablet TAKE 1 TABLET EVERY DAY 90 tablet 3  . vitamin  B-12 (CYANOCOBALAMIN) 100 MCG tablet Take 100 mcg by mouth daily.     No current facility-administered medications on file prior to visit.     Review of Systems    Review of Systems  Constitutional: Negative for fever, appetite change, and unexpected weight change.  Eyes: Negative for pain and visual disturbance.  ENT pos for pnd  Respiratory: Negative for shortness of breath.  pos for improved cough and wheeze and yellow phlegm  Cardiovascular: Negative for cp or palpitations    Gastrointestinal: Negative for nausea, diarrhea and constipation.  Genitourinary: Negative for urgency and frequency.  Skin: Negative for pallor or rash   Neurological: Negative for , light-headedness, numbness and headaches.  Hematological: Negative for adenopathy. Does not bruise/bleed easily.  Psychiatric/Behavioral: Negative for dysphoric mood. The patient is not nervous/anxious.      Objective:   Physical Exam  Constitutional: She appears well-developed and well-nourished. No distress.  Well appearing overwt elderly female with mobility impairment  HENT:  Head: Normocephalic and atraumatic.  Right Ear: External ear normal.  Left Ear: External ear normal.  Mouth/Throat: Oropharynx is clear and moist.  Nares are injected and congested  No sinus tenderness Clear rhinorrhea and post nasal drip   Eyes: Conjunctivae and EOM are normal. Pupils are equal, round, and reactive to  light. Right eye exhibits no discharge. Left eye exhibits no discharge.  Neck: Normal range of motion. Neck supple.  Cardiovascular: Normal rate and normal heart sounds.   Pulmonary/Chest: Effort normal. No respiratory distress. She has wheezes. She has no rales. She exhibits no tenderness.  Scant wheeze on forced exp only  No crackles/rales or rhonchi  Some upper airway sounds Good air exch  Lymphadenopathy:    She has no cervical adenopathy.  Neurological: She is alert. No cranial nerve deficit.  Skin: Skin is warm and dry.  No rash noted.  Psychiatric: She has a normal mood and affect.          Assessment & Plan:   Problem List Items Addressed This Visit      Respiratory   Viral URI with cough    Was seen in ED on 4/18 Reviewed hospital records, lab results and studies in detail   Much improved today Re assuring  Enc fluids for renal insuff She will continue symptomatic care  Refilled her albuterol hfa inhaler for prn use  Update if not starting to improve in a week or if worsening

## 2016-12-07 NOTE — Patient Instructions (Addendum)
Continue the chlorcedin and robitussin as long as you need it  Keep your fluid intake up  Rest when you need to  Advance activity slowly when you are ready  If your symptoms get worse - cough or shortness of breath or fever   Use your inhaler as needed for wheezing

## 2016-12-11 ENCOUNTER — Inpatient Hospital Stay (HOSPITAL_COMMUNITY)
Admission: EM | Admit: 2016-12-11 | Discharge: 2016-12-13 | DRG: 195 | Disposition: A | Discharge status: Home / Self Care | Payer: Medicare Other | Attending: Family Medicine | Admitting: Family Medicine

## 2016-12-11 ENCOUNTER — Telehealth: Payer: Self-pay | Admitting: Family Medicine

## 2016-12-11 ENCOUNTER — Encounter (HOSPITAL_COMMUNITY): Payer: Self-pay | Admitting: *Deleted

## 2016-12-11 ENCOUNTER — Emergency Department (HOSPITAL_COMMUNITY): Payer: Medicare Other

## 2016-12-11 DIAGNOSIS — M8580 Other specified disorders of bone density and structure, unspecified site: Secondary | ICD-10-CM | POA: Diagnosis present

## 2016-12-11 DIAGNOSIS — F329 Major depressive disorder, single episode, unspecified: Secondary | ICD-10-CM | POA: Diagnosis present

## 2016-12-11 DIAGNOSIS — E11319 Type 2 diabetes mellitus with unspecified diabetic retinopathy without macular edema: Secondary | ICD-10-CM

## 2016-12-11 DIAGNOSIS — E118 Type 2 diabetes mellitus with unspecified complications: Secondary | ICD-10-CM | POA: Diagnosis not present

## 2016-12-11 DIAGNOSIS — R531 Weakness: Secondary | ICD-10-CM | POA: Diagnosis not present

## 2016-12-11 DIAGNOSIS — R05 Cough: Secondary | ICD-10-CM | POA: Diagnosis not present

## 2016-12-11 DIAGNOSIS — J189 Pneumonia, unspecified organism: Secondary | ICD-10-CM

## 2016-12-11 DIAGNOSIS — G4730 Sleep apnea, unspecified: Secondary | ICD-10-CM | POA: Diagnosis present

## 2016-12-11 DIAGNOSIS — Z8249 Family history of ischemic heart disease and other diseases of the circulatory system: Secondary | ICD-10-CM

## 2016-12-11 DIAGNOSIS — R0682 Tachypnea, not elsewhere classified: Secondary | ICD-10-CM

## 2016-12-11 DIAGNOSIS — Z9849 Cataract extraction status, unspecified eye: Secondary | ICD-10-CM

## 2016-12-11 DIAGNOSIS — Z7982 Long term (current) use of aspirin: Secondary | ICD-10-CM

## 2016-12-11 DIAGNOSIS — Z808 Family history of malignant neoplasm of other organs or systems: Secondary | ICD-10-CM

## 2016-12-11 DIAGNOSIS — Z8673 Personal history of transient ischemic attack (TIA), and cerebral infarction without residual deficits: Secondary | ICD-10-CM | POA: Diagnosis not present

## 2016-12-11 DIAGNOSIS — E119 Type 2 diabetes mellitus without complications: Secondary | ICD-10-CM | POA: Diagnosis not present

## 2016-12-11 DIAGNOSIS — Z888 Allergy status to other drugs, medicaments and biological substances status: Secondary | ICD-10-CM

## 2016-12-11 DIAGNOSIS — A491 Streptococcal infection, unspecified site: Secondary | ICD-10-CM | POA: Diagnosis present

## 2016-12-11 DIAGNOSIS — Z90710 Acquired absence of both cervix and uterus: Secondary | ICD-10-CM

## 2016-12-11 DIAGNOSIS — R0902 Hypoxemia: Secondary | ICD-10-CM | POA: Diagnosis present

## 2016-12-11 DIAGNOSIS — F419 Anxiety disorder, unspecified: Secondary | ICD-10-CM | POA: Diagnosis present

## 2016-12-11 DIAGNOSIS — R404 Transient alteration of awareness: Secondary | ICD-10-CM | POA: Diagnosis not present

## 2016-12-11 DIAGNOSIS — J180 Bronchopneumonia, unspecified organism: Secondary | ICD-10-CM | POA: Diagnosis not present

## 2016-12-11 DIAGNOSIS — I10 Essential (primary) hypertension: Secondary | ICD-10-CM | POA: Diagnosis not present

## 2016-12-11 DIAGNOSIS — N6019 Diffuse cystic mastopathy of unspecified breast: Secondary | ICD-10-CM | POA: Diagnosis present

## 2016-12-11 DIAGNOSIS — R5383 Other fatigue: Secondary | ICD-10-CM | POA: Diagnosis not present

## 2016-12-11 DIAGNOSIS — R0603 Acute respiratory distress: Secondary | ICD-10-CM | POA: Diagnosis not present

## 2016-12-11 DIAGNOSIS — Z825 Family history of asthma and other chronic lower respiratory diseases: Secondary | ICD-10-CM

## 2016-12-11 DIAGNOSIS — E538 Deficiency of other specified B group vitamins: Secondary | ICD-10-CM | POA: Diagnosis present

## 2016-12-11 DIAGNOSIS — M1990 Unspecified osteoarthritis, unspecified site: Secondary | ICD-10-CM | POA: Diagnosis present

## 2016-12-11 DIAGNOSIS — E785 Hyperlipidemia, unspecified: Secondary | ICD-10-CM | POA: Diagnosis present

## 2016-12-11 DIAGNOSIS — E860 Dehydration: Secondary | ICD-10-CM | POA: Diagnosis present

## 2016-12-11 DIAGNOSIS — J154 Pneumonia due to other streptococci: Secondary | ICD-10-CM | POA: Diagnosis not present

## 2016-12-11 HISTORY — DX: Bronchopneumonia, unspecified organism: J18.0

## 2016-12-11 LAB — CBC WITH DIFFERENTIAL/PLATELET
Basophils Absolute: 0 10*3/uL (ref 0.0–0.1)
Basophils Relative: 0 %
Eosinophils Absolute: 0.1 10*3/uL (ref 0.0–0.7)
Eosinophils Relative: 1 %
HCT: 37.4 % (ref 36.0–46.0)
Hemoglobin: 12.2 g/dL (ref 12.0–15.0)
Lymphocytes Relative: 8 %
Lymphs Abs: 1 10*3/uL (ref 0.7–4.0)
MCH: 28.7 pg (ref 26.0–34.0)
MCHC: 32.6 g/dL (ref 30.0–36.0)
MCV: 88 fL (ref 78.0–100.0)
Monocytes Absolute: 0.5 10*3/uL (ref 0.1–1.0)
Monocytes Relative: 4 %
Neutro Abs: 11.2 10*3/uL — ABNORMAL HIGH (ref 1.7–7.7)
Neutrophils Relative %: 87 %
Platelets: 157 10*3/uL (ref 150–400)
RBC: 4.25 MIL/uL (ref 3.87–5.11)
RDW: 14.1 % (ref 11.5–15.5)
WBC: 12.8 10*3/uL — ABNORMAL HIGH (ref 4.0–10.5)

## 2016-12-11 LAB — COMPREHENSIVE METABOLIC PANEL
ALT: 13 U/L — ABNORMAL LOW (ref 14–54)
AST: 19 U/L (ref 15–41)
Albumin: 3.5 g/dL (ref 3.5–5.0)
Alkaline Phosphatase: 76 U/L (ref 38–126)
Anion gap: 8 (ref 5–15)
BUN: 22 mg/dL — ABNORMAL HIGH (ref 6–20)
CO2: 23 mmol/L (ref 22–32)
Calcium: 9.1 mg/dL (ref 8.9–10.3)
Chloride: 109 mmol/L (ref 101–111)
Creatinine, Ser: 1.37 mg/dL — ABNORMAL HIGH (ref 0.44–1.00)
GFR calc Af Amer: 40 mL/min — ABNORMAL LOW (ref >60)
GFR calc non Af Amer: 34 mL/min — ABNORMAL LOW (ref >60)
Glucose, Bld: 138 mg/dL — ABNORMAL HIGH (ref 65–99)
Potassium: 4.1 mmol/L (ref 3.5–5.1)
Sodium: 140 mmol/L (ref 135–145)
Total Bilirubin: 0.4 mg/dL (ref 0.3–1.2)
Total Protein: 5.6 g/dL — ABNORMAL LOW (ref 6.5–8.1)

## 2016-12-11 LAB — GLUCOSE, CAPILLARY
Glucose-Capillary: 201 mg/dL — ABNORMAL HIGH (ref 65–99)
Glucose-Capillary: 225 mg/dL — ABNORMAL HIGH (ref 65–99)

## 2016-12-11 LAB — PROCALCITONIN: Procalcitonin: <0.1 ng/mL

## 2016-12-11 LAB — I-STAT CG4 LACTIC ACID, ED: Lactic Acid, Venous: 1.17 mmol/L (ref 0.5–1.9)

## 2016-12-11 MED ORDER — DEXTROSE 5 % IV SOLN
500.0000 mg | INTRAVENOUS | Status: DC
  Administered 2016-12-11 – 2016-12-12 (×2): 500 mg via INTRAVENOUS
  Filled 2016-12-11 (×3): qty 500

## 2016-12-11 MED ORDER — HYDRALAZINE HCL 100 MG PO TABS: 100.0000 mg | ORAL_TABLET | Freq: Three times a day (TID) | ORAL | Status: DC

## 2016-12-11 MED ORDER — SODIUM CHLORIDE 0.9 % IV BOLUS (SEPSIS)
1000.0000 mL | Freq: Once | INTRAVENOUS | Status: AC
  Administered 2016-12-11: 1000 mL via INTRAVENOUS

## 2016-12-11 MED ORDER — MIRTAZAPINE 7.5 MG PO TABS
7.5000 mg | ORAL_TABLET | Freq: Every day | ORAL | Status: DC
  Administered 2016-12-11 – 2016-12-12 (×2): 7.5 mg via ORAL
  Filled 2016-12-11 (×3): qty 1

## 2016-12-11 MED ORDER — ALBUTEROL SULFATE (2.5 MG/3ML) 0.083% IN NEBU
2.5000 mg | INHALATION_SOLUTION | RESPIRATORY_TRACT | Status: DC
  Administered 2016-12-11: 2.5 mg via RESPIRATORY_TRACT
  Filled 2016-12-11: qty 3

## 2016-12-11 MED ORDER — GEMFIBROZIL 600 MG PO TABS
600.0000 mg | ORAL_TABLET | Freq: Every day | ORAL | Status: DC
  Administered 2016-12-11 – 2016-12-13 (×3): 600 mg via ORAL
  Filled 2016-12-11 (×3): qty 1

## 2016-12-11 MED ORDER — DEXTROSE 5 % IV SOLN
1.0000 g | INTRAVENOUS | Status: DC
  Administered 2016-12-11 – 2016-12-12 (×2): 1 g via INTRAVENOUS
  Filled 2016-12-11 (×3): qty 10

## 2016-12-11 MED ORDER — HEPARIN SODIUM (PORCINE) 5000 UNIT/ML IJ SOLN
5000.0000 [IU] | Freq: Three times a day (TID) | INTRAMUSCULAR | Status: DC
  Administered 2016-12-11 – 2016-12-13 (×5): 5000 [IU] via SUBCUTANEOUS
  Filled 2016-12-11 (×5): qty 1

## 2016-12-11 MED ORDER — ACETAMINOPHEN 325 MG PO TABS: 650.0000 mg | ORAL_TABLET | Freq: Four times a day (QID) | ORAL | Status: DC | PRN

## 2016-12-11 MED ORDER — IPRATROPIUM-ALBUTEROL 0.5-2.5 (3) MG/3ML IN SOLN
3.0000 mL | RESPIRATORY_TRACT | Status: DC
  Administered 2016-12-11: 3 mL via RESPIRATORY_TRACT
  Filled 2016-12-11: qty 3

## 2016-12-11 MED ORDER — CARBOXYMETHYLCELLULOSE SODIUM 1 % OP SOLN: 1.0000 [drp] | Freq: Two times a day (BID) | OPHTHALMIC | Status: DC

## 2016-12-11 MED ORDER — ONDANSETRON HCL 4 MG PO TABS: 4.0000 mg | ORAL_TABLET | Freq: Four times a day (QID) | ORAL | Status: DC | PRN

## 2016-12-11 MED ORDER — METOPROLOL SUCCINATE ER 25 MG PO TB24
12.5000 mg | ORAL_TABLET | Freq: Two times a day (BID) | ORAL | Status: DC
  Administered 2016-12-11 – 2016-12-13 (×4): 12.5 mg via ORAL
  Filled 2016-12-11 (×5): qty 1

## 2016-12-11 MED ORDER — ONDANSETRON HCL 4 MG/2ML IJ SOLN: 4.0000 mg | Freq: Four times a day (QID) | INTRAMUSCULAR | Status: DC | PRN

## 2016-12-11 MED ORDER — SODIUM CHLORIDE 0.9 % IV SOLN
INTRAVENOUS | Status: DC
Start: 2016-12-11 — End: 2016-12-12
  Administered 2016-12-11: 17:00:00 via INTRAVENOUS

## 2016-12-11 MED ORDER — INSULIN ASPART 100 UNIT/ML ~~LOC~~ SOLN
0.0000 [IU] | Freq: Three times a day (TID) | SUBCUTANEOUS | Status: DC
  Administered 2016-12-11: 3 [IU] via SUBCUTANEOUS
  Administered 2016-12-12: 1 [IU] via SUBCUTANEOUS
  Administered 2016-12-12: 3 [IU] via SUBCUTANEOUS
  Administered 2016-12-12 – 2016-12-13 (×2): 1 [IU] via SUBCUTANEOUS
  Administered 2016-12-13: 3 [IU] via SUBCUTANEOUS

## 2016-12-11 MED ORDER — IPRATROPIUM-ALBUTEROL 0.5-2.5 (3) MG/3ML IN SOLN
3.0000 mL | Freq: Once | RESPIRATORY_TRACT | Status: AC
  Administered 2016-12-11: 3 mL via RESPIRATORY_TRACT
  Filled 2016-12-11: qty 3

## 2016-12-11 MED ORDER — DOCUSATE SODIUM 100 MG PO CAPS
200.0000 mg | ORAL_CAPSULE | Freq: Every day | ORAL | Status: DC
  Administered 2016-12-11 – 2016-12-12 (×2): 200 mg via ORAL
  Filled 2016-12-11 (×2): qty 2

## 2016-12-11 MED ORDER — DEXAMETHASONE SODIUM PHOSPHATE 10 MG/ML IJ SOLN
10.0000 mg | Freq: Once | INTRAMUSCULAR | Status: AC
  Administered 2016-12-11: 10 mg via INTRAVENOUS
  Filled 2016-12-11: qty 1

## 2016-12-11 MED ORDER — IPRATROPIUM-ALBUTEROL 0.5-2.5 (3) MG/3ML IN SOLN
3.0000 mL | Freq: Two times a day (BID) | RESPIRATORY_TRACT | Status: DC
  Administered 2016-12-12 – 2016-12-13 (×3): 3 mL via RESPIRATORY_TRACT
  Filled 2016-12-11 (×3): qty 3

## 2016-12-11 MED ORDER — ACETAMINOPHEN 650 MG RE SUPP: 650.0000 mg | Freq: Four times a day (QID) | RECTAL | Status: DC | PRN

## 2016-12-11 MED ORDER — HYPROMELLOSE (GONIOSCOPIC) 2.5 % OP SOLN
1.0000 [drp] | Freq: Two times a day (BID) | OPHTHALMIC | Status: DC
  Administered 2016-12-11 – 2016-12-12 (×3): 1 [drp] via OPHTHALMIC
  Filled 2016-12-11: qty 15

## 2016-12-11 MED ORDER — ALPRAZOLAM 0.5 MG PO TABS: 0.5000 mg | ORAL_TABLET | Freq: Two times a day (BID) | ORAL | Status: DC | PRN

## 2016-12-11 MED ORDER — HYDRALAZINE HCL 50 MG PO TABS
100.0000 mg | ORAL_TABLET | Freq: Three times a day (TID) | ORAL | Status: DC
  Administered 2016-12-11 – 2016-12-13 (×5): 100 mg via ORAL
  Filled 2016-12-11 (×5): qty 2

## 2016-12-11 MED ORDER — ENSURE ENLIVE PO LIQD
237.0000 mL | Freq: Two times a day (BID) | ORAL | Status: DC
  Administered 2016-12-12: 237 mL via ORAL

## 2016-12-11 MED ORDER — AMLODIPINE BESYLATE 10 MG PO TABS
10.0000 mg | ORAL_TABLET | Freq: Every day | ORAL | Status: DC
  Administered 2016-12-11 – 2016-12-13 (×3): 10 mg via ORAL
  Filled 2016-12-11 (×3): qty 1

## 2016-12-11 MED ORDER — TRANDOLAPRIL 4 MG PO TABS
4.0000 mg | ORAL_TABLET | Freq: Every day | ORAL | Status: DC
  Administered 2016-12-11 – 2016-12-13 (×3): 4 mg via ORAL
  Filled 2016-12-11 (×3): qty 1

## 2016-12-11 MED ORDER — INSULIN ASPART 100 UNIT/ML ~~LOC~~ SOLN
0.0000 [IU] | Freq: Every day | SUBCUTANEOUS | Status: DC
  Administered 2016-12-11: 3 [IU] via SUBCUTANEOUS
  Administered 2016-12-12: 2 [IU] via SUBCUTANEOUS

## 2016-12-11 MED ORDER — GUAIFENESIN ER 600 MG PO TB12
1200.0000 mg | ORAL_TABLET | Freq: Two times a day (BID) | ORAL | Status: DC
  Administered 2016-12-11 – 2016-12-13 (×4): 1200 mg via ORAL
  Filled 2016-12-11 (×4): qty 2

## 2016-12-11 MED ORDER — ALBUTEROL SULFATE (2.5 MG/3ML) 0.083% IN NEBU
2.5000 mg | INHALATION_SOLUTION | Freq: Four times a day (QID) | RESPIRATORY_TRACT | Status: DC | PRN
Start: 2016-12-11 — End: 2016-12-13

## 2016-12-11 MED ORDER — ASPIRIN 325 MG PO TABS
325.0000 mg | ORAL_TABLET | Freq: Every day | ORAL | Status: DC
  Administered 2016-12-11 – 2016-12-13 (×3): 325 mg via ORAL
  Filled 2016-12-11 (×3): qty 1

## 2016-12-11 MED ORDER — SERTRALINE HCL 50 MG PO TABS
50.0000 mg | ORAL_TABLET | ORAL | Status: DC
  Administered 2016-12-12 – 2016-12-13 (×2): 50 mg via ORAL
  Filled 2016-12-11 (×2): qty 1

## 2016-12-11 NOTE — ED Triage Notes (Signed)
Pt arrives from home via [facility]. Pt was seen here on 4/18 and dx with a URI. Pt states she has continued to get worse and is now very fatigued. Pt states she has a productive cough with green sputum.

## 2016-12-11 NOTE — ED Notes (Signed)
Got patient undress on the monitor did ekg shown to Dr Winfred Leeds

## 2016-12-11 NOTE — ED Notes (Signed)
Dinner tray ordered.

## 2016-12-11 NOTE — Telephone Encounter (Signed)
Patient Name: Renee [name]  DOB: August 12, 1929    Initial Comment Caller states that she was seen on Friday for a follow up from her ER visit. She is wheezing, weak, and coughing.    Nurse Assessment  Nurse: Julien Girt, RN, Almyra Free Date/Time Eilene Ghazi Time): 12/11/2016 8:25:13 AM  Confirm and document reason for call. If symptomatic, describe symptoms. ---Caller states, pt and caregiver on the line, that she was seen on Friday for a follow up from her ER visit and was doing better after URI. She is wheezing, coughing and very weak. Pt is using Ventolin inhaler now  Does the patient have any new or worsening symptoms? ---Yes  Will a triage be completed? ---Yes  Related visit to physician within the last 2 weeks? ---Yes  Does the PT have any chronic conditions? (i.e. diabetes, asthma, etc.) ---Yes  List chronic conditions. ---Diabetic, Htn  Is this a behavioral health or substance abuse call? ---No     Guidelines    Guideline Title Affirmed Question Affirmed Notes  Cough - Acute Productive Difficulty breathing    Final Disposition User   Call EMS 911 Now Julien Girt, RN, Almyra Free    Comments  Pt stated she feels worse than she did the day she was seen in the ED. Her sx returned this weekend.   Disagree/Comply: Comply

## 2016-12-11 NOTE — ED Provider Notes (Signed)
Complains of cough and generalized weakness for approximately week. She feels somewhat improved today over yesterday. Denies nausea or vomiting or diminished appetite. No fever. No other associated symptoms. Chest x-ray viewed by me   Orlie Dakin, MD 12/11/16 1237

## 2016-12-11 NOTE — ED Notes (Signed)
Ambulated with walker from room E42 to RR1. SpO2 stayed above 96. RR ranged 28-33

## 2016-12-11 NOTE — ED Provider Notes (Signed)
[facility] DEPT Provider Note   CSN: [id number] Arrival date & time: 12/11/16  0935     History   Chief Complaint Chief Complaint  Patient presents with  . Fatigue  . Cough    HPI Renee [name] is a 83 y.o. female with a h/o of DM Type II who presents to the emergency department for weakness, which she states began yesterday. She reports that she came because she was concerned she was having another stroke (CVA in 2011 w/ some left-sided upper extremity deficits). She reports that yesterday her bilateral legs began to swell and she has become increasingly short of breath.   She reports that she has been ill for the last week with a productive cough with green and yellow sputum, mild wheezing, sore throat, and rhinorrhea. She was evaluated in the ED on 4/18 and discharged to home with return precautions. She followed up with her PCP, Dr. Glori Bickers, on 4/20. She states that she has been treating his symptoms with an albuterol inhaler 2-3 times per day, Coricidin, and Robitussin for diabetics. She reports that she has not had much of an appetite over the last few days and has tried to drink 2-3 beverages per day to stay hydrated. No fever, chills, abdominal pain, or N/V/D.  PMH includes CVA in 2011, seasonal allergies, and DM Type II on glipizide. She lives alone and ambulates with a walker at baseline. Her sister also lives in the area.   HPI  Past Medical History:  Diagnosis Date  . Angioedema    possibly from voltaren  . Bronchopneumonia 12/11/2016  . Degenerative disc disease   . Diabetes mellitus    type II  . Hyperlipidemia   . Hypertension   . LVH (left ventricular hypertrophy)    and atrial enlargement by echo in past with nl EF  . Nasal pruritis   . Osteoarthritis   . Osteopenia   . Renal insufficiency   . Sleep apnea   . Stroke [facility]) 05/2010   Small vessel sobcortical (in [place] trial) with Dr Leonie Man, residual L hemiparesis  . Vitamin B 12 deficiency 04/08     Patient Active Problem List   Diagnosis Date Noted  . Streptococcus pneumoniae 12/12/2016  . Bronchopneumonia 12/11/2016  . Respiratory distress 12/11/2016  . Tachypnea 12/11/2016  . Diabetes mellitus with complication ([place]) 95/28/4132  . History of CVA (cerebrovascular accident) 12/11/2016  . Viral URI with cough 12/07/2016  . Estrogen deficiency 08/30/2015  . Encounter for Medicare annual wellness exam 04/17/2013  . Mobility impaired 06/26/2011  . History of retinal detachment 01/10/2011  . Sleep apnea 11/28/2010  . Depression with anxiety 08/25/2010  . Hemiplegia, late effect of cerebrovascular disease ([place]) 07/05/2010  . POSTHERPETIC NEURALGIA 11/09/2009  . Osteopenia 12/20/2008  . Renal insufficiency 06/29/2008  . BACK PAIN 01/26/2008  . EDEMA 01/26/2008  . B12 deficiency 01/10/2007  . Diabetes type 2, controlled ([place]) 11/27/2006  . HLD (hyperlipidemia) 11/27/2006  . Essential hypertension 11/27/2006  . FIBROCYSTIC BREAST DISEASE 11/27/2006  . ROSACEA 11/27/2006  . OSTEOARTHRITIS 11/27/2006  . URINARY INCONTINENCE, MIXED 11/27/2006    Past Surgical History:  Procedure Laterality Date  . ABDOMINAL HYSTERECTOMY     BSO-fibroids  . APPENDECTOMY    . BACK SURGERY    . COLON SURGERY     due to punctured intestines  . EYE SURGERY     cataract extraction  . KNEE SURGERY     arthroscope  . PARS PLANA VITRECTOMY  07/31/2011  Procedure: PARS PLANA VITRECTOMY WITH 25 GAUGE;  Surgeon: [name], MD;  Location: [facility];  Service: Ophthalmology;  Laterality: Right;  REMOVAL OF SILICONE OIL AND LASER RIGHT EYE  . RETINAL DETACHMENT SURGERY  02/18/11   times 2  . SPINE SURGERY  08/09   spinal decompression surgery    OB History    No data available       Home Medications    Prior to Admission medications   Medication Sig Start Date End Date Taking? Authorizing Provider  acetaminophen (TYLENOL) 500 MG tablet Take 1,000 mg by mouth at bedtime.    Yes  Historical Provider, MD  albuterol (PROVENTIL HFA;VENTOLIN HFA) 108 (90 Base) MCG/ACT inhaler Inhale 2 puffs into the lungs every 4 (four) hours as needed for wheezing. 12/07/16  Yes Abner Greenspan, MD  ALPRAZolam (XANAX) 0.5 MG tablet TAKE 1 TABLET BY MOUTH TWICE A DAY AS NEEDED FOR ANXIETY 10/05/16  Yes Abner Greenspan, MD  amLODipine (NORVASC) 10 MG tablet TAKE 1 TABLET EVERY DAY 03/08/16  Yes Abner Greenspan, MD  aspirin 325 MG tablet Take 325 mg by mouth daily.    Yes Historical Provider, MD  beta carotene w/minerals (OCUVITE) tablet Take 1 tablet by mouth 2 (two) times daily.    Yes Historical Provider, MD  calcium-vitamin D (OSCAL WITH D) 500-200 MG-UNIT per tablet Take 1 tablet by mouth daily.    Yes Historical Provider, MD  Chlorphen-Phenyleph-APAP (CORICIDIN D COLD/FLU/SINUS) 2-5-325 MG TABS Take 1 tablet by mouth daily as needed (cold symptoms).   Yes Historical Provider, MD  Cholecalciferol (VITAMIN D) 1000 UNITS capsule Take 1,000 Units by mouth daily.    Yes Historical Provider, MD  cyanocobalamin (,VITAMIN B-12,) 1000 MCG/ML injection Inject 1,000 mcg into the muscle every 30 (thirty) days.    Yes Historical Provider, MD  docusate sodium (COLACE) 100 MG capsule Take 200 mg by mouth at bedtime.   Yes Historical Provider, MD  ferrous sulfate 325 (65 FE) MG tablet Take 325 mg by mouth 2 (two) times daily.   Yes Historical Provider, MD  gemfibrozil (LOPID) 600 MG tablet TAKE 1 TABLET EVERY DAY 08/17/16  Yes Abner Greenspan, MD  glipiZIDE (GLUCOTROL XL) 2.5 MG 24 hr tablet Take 1 tablet (2.5 mg total) by mouth daily with breakfast. 12/22/15  Yes Abner Greenspan, MD  guaiFENesin (ROBITUSSIN) 100 MG/5ML SOLN Take 5 mLs by mouth every 4 (four) hours as needed for cough or to loosen phlegm.   Yes Historical Provider, MD  hydrALAZINE (APRESOLINE) 100 MG tablet Take 100 mg by mouth 3 (three) times daily.  08/30/14  Yes Historical Provider, MD  metoprolol succinate (TOPROL-XL) 25 MG 24 hr tablet Take 0.5 tablets  (12.5 mg total) by mouth 2 (two) times daily. 09/12/16  Yes Abner Greenspan, MD  mirtazapine (REMERON) 15 MG tablet Take 0.5 tablets (7.5 mg total) by mouth at bedtime. 09/12/16  Yes Abner Greenspan, MD  Multiple Vitamin (MULTIVITAMIN) capsule Take 1 capsule by mouth daily.    Yes Historical Provider, MD  Polyvinyl Alcohol-Povidone (REFRESH OP) Place 1 drop into both eyes daily as needed (dry eyes).   Yes Historical Provider, MD  sertraline (ZOLOFT) 50 MG tablet Take 1 tablet (50 mg total) by mouth daily. Patient taking differently: Take 50 mg by mouth every morning.  09/12/16  Yes Abner Greenspan, MD  trandolapril (MAVIK) 4 MG tablet TAKE 1 TABLET EVERY DAY 03/08/16  Yes Abner Greenspan, MD  vitamin B-12 (CYANOCOBALAMIN) 100 MCG tablet Take 100 mcg by mouth daily.   Yes Historical Provider, MD  doxycycline (VIBRAMYCIN) 100 MG capsule Take 1 capsule (100 mg total) by mouth 2 (two) times daily. 12/13/16 12/18/16  Clanford Marisa Hua, MD    Family History Family History  Problem Relation Age of Onset  . COPD Brother   . Cancer Sister     brain tumor with hemmorhage  . Heart disease Sister     CAD    Social History Social History  Substance Use Topics  . Smoking status: Never Smoker  . Smokeless tobacco: Never Used  . Alcohol use No     Allergies   Diclofenac sodium and Pioglitazone   Review of Systems Review of Systems  Constitutional: Negative for activity change, chills and fever.  HENT: Positive for rhinorrhea and sore throat.   Respiratory: Positive for shortness of breath and wheezing.   Cardiovascular: Positive for leg swelling. Negative for chest pain.  Gastrointestinal: Negative for abdominal pain and diarrhea.  Musculoskeletal: Negative for back pain.  Skin: Negative for rash.  Allergic/Immunologic: Positive for immunocompromised state.  Neurological: Positive for weakness.  Psychiatric/Behavioral: Negative for confusion.   Physical Exam Updated Vital Signs BP (!) 131/49 (BP  Location: Right Arm)   Pulse 66   Temp 97.6 F (36.4 C) (Oral)   Resp 19   Ht 5\' 2"  (1.575 m)   Wt 81.9 kg   SpO2 97%   BMI 33.03 kg/m   Physical Exam  Constitutional: She is oriented to person, place, and time. She appears well-developed and well-nourished. No distress.  HENT:  Head: Normocephalic and atraumatic.  Right Ear: Tympanic membrane normal.  Left Ear: Tympanic membrane normal.  Nose: Nose normal.  Mouth/Throat: Uvula is midline.  Minimal oropharyngeal erythema. Tongue appears dry.   Eyes: Conjunctivae are normal.  Neck: Neck supple. No tracheal deviation present.  Cardiovascular: Normal rate and regular rhythm.  Exam reveals no gallop and no friction rub.   No murmur heard. Pulmonary/Chest: Effort normal. No accessory muscle usage. No respiratory distress. She has wheezes. She has rhonchi. She has no rales.  Abdominal: Soft. She exhibits no distension. There is no tenderness. There is no guarding.  Musculoskeletal: Normal range of motion. She exhibits edema. She exhibits no tenderness or deformity.  Mild bilateral lower extremity edema. DP and PT pulses 2+.   Neurological: She is alert and oriented to person, place, and time. No sensory deficit.  CN II-XII grossly intact. 5/5 strength to the bilateral upper and lower extremities. Normal finger to nose.   Skin: Skin is warm and dry. Capillary refill takes 2 to 3 seconds. No rash noted. She is not diaphoretic. No erythema.  Psychiatric: Her behavior is normal.  Nursing note and vitals reviewed.    ED Treatments / Results  Labs (all labs ordered are listed, but only abnormal results are displayed) Labs Reviewed  CBC WITH DIFFERENTIAL/PLATELET - Abnormal; Notable for the following:       Result Value   WBC 12.8 (*)    Neutro Abs 11.2 (*)    All other components within normal limits  COMPREHENSIVE METABOLIC PANEL - Abnormal; Notable for the following:    Glucose, Bld 138 (*)    BUN 22 (*)    Creatinine, Ser 1.37  (*)    Total Protein 5.6 (*)    ALT 13 (*)    GFR calc non Af Amer 34 (*)    GFR calc Af Amer 40 (*)  All other components within normal limits  STREP PNEUMONIAE URINARY ANTIGEN - Abnormal; Notable for the following:    Strep Pneumo Urinary Antigen POSITIVE (*)    All other components within normal limits  CBC - Abnormal; Notable for the following:    RBC 3.44 (*)    Hemoglobin 10.0 (*)    HCT 30.6 (*)    Platelets 143 (*)    All other components within normal limits  BASIC METABOLIC PANEL - Abnormal; Notable for the following:    Chloride 112 (*)    CO2 20 (*)    Glucose, Bld 143 (*)    Creatinine, Ser 1.06 (*)    Calcium 7.9 (*)    GFR calc non Af Amer 47 (*)    GFR calc Af Amer 54 (*)    All other components within normal limits  GLUCOSE, CAPILLARY - Abnormal; Notable for the following:    Glucose-Capillary 201 (*)    All other components within normal limits  GLUCOSE, CAPILLARY - Abnormal; Notable for the following:    Glucose-Capillary 225 (*)    All other components within normal limits  GLUCOSE, CAPILLARY - Abnormal; Notable for the following:    Glucose-Capillary 125 (*)    All other components within normal limits  GLUCOSE, CAPILLARY - Abnormal; Notable for the following:    Glucose-Capillary 244 (*)    All other components within normal limits  GLUCOSE, CAPILLARY - Abnormal; Notable for the following:    Glucose-Capillary 135 (*)    All other components within normal limits  GLUCOSE, CAPILLARY - Abnormal; Notable for the following:    Glucose-Capillary 241 (*)    All other components within normal limits  GLUCOSE, CAPILLARY - Abnormal; Notable for the following:    Glucose-Capillary 121 (*)    All other components within normal limits  GLUCOSE, CAPILLARY - Abnormal; Notable for the following:    Glucose-Capillary 227 (*)    All other components within normal limits  CULTURE, BLOOD (ROUTINE X 2)  CULTURE, BLOOD (ROUTINE X 2)  CULTURE, EXPECTORATED  SPUTUM-ASSESSMENT  PROCALCITONIN  HIV ANTIBODY (ROUTINE TESTING)  PROCALCITONIN  LEGIONELLA PNEUMOPHILA SEROGP 1 UR AG  I-STAT CG4 LACTIC ACID, ED    EKG  EKG Interpretation  Date/Time:  Tuesday December 11 2016 09:51:48 EDT Ventricular Rate:  73 PR Interval:    QRS Duration: 87 QT Interval:  471 QTC Calculation: 520 R Axis:   -80 Text Interpretation:  Sinus rhythm Probable left atrial enlargement Left anterior fascicular block Low voltage, precordial leads Anteroseptal infarct, age indeterminate Prolonged QT interval No significant change since last tracing Confirmed by Winfred Leeds  MD, SAM [phone]) on 12/11/2016 10:07:36 AM Also confirmed by Winfred Leeds  MD, SAM [phone]), editor Drema Pry ([id number])  on 12/11/2016 10:56:13 AM       Radiology No results found.  Procedures Procedures (including critical care time)  Medications Ordered in ED Medications  ALPRAZolam (XANAX) tablet 0.5 mg (not administered)  metoprolol succinate (TOPROL-XL) 24 hr tablet 12.5 mg (12.5 mg Oral Given 12/13/16 0909)  mirtazapine (REMERON) tablet 7.5 mg (7.5 mg Oral Given 12/12/16 2230)  sertraline (ZOLOFT) tablet 50 mg (50 mg Oral Given 12/13/16 0626)  gemfibrozil (LOPID) tablet 600 mg (600 mg Oral Given 12/13/16 0908)  amLODipine (NORVASC) tablet 10 mg (10 mg Oral Given 12/13/16 0910)  trandolapril (MAVIK) tablet 4 mg (4 mg Oral Given 12/13/16 0910)  docusate sodium (COLACE) capsule 200 mg (200 mg Oral Given 12/12/16 2229)  aspirin tablet 325 mg (325  mg Oral Given 12/13/16 0910)  guaiFENesin (MUCINEX) 12 hr tablet 1,200 mg (1,200 mg Oral Given 12/13/16 0910)  heparin injection 5,000 Units (5,000 Units Subcutaneous Not Given 12/13/16 1336)  acetaminophen (TYLENOL) tablet 650 mg (not administered)    Or  acetaminophen (TYLENOL) suppository 650 mg (not administered)  ondansetron (ZOFRAN) tablet 4 mg (not administered)    Or  ondansetron (ZOFRAN) injection 4 mg (not administered)  insulin aspart (novoLOG)  injection 0-9 Units (3 Units Subcutaneous Given 12/13/16 1221)  insulin aspart (novoLOG) injection 0-5 Units (2 Units Subcutaneous Given 12/12/16 2232)  cefTRIAXone (ROCEPHIN) 1 g in dextrose 5 % 50 mL IVPB (0 g Intravenous Stopped 12/12/16 1843)  azithromycin (ZITHROMAX) 500 mg in dextrose 5 % 250 mL IVPB (0 mg Intravenous Stopped 12/12/16 2031)  hydroxypropyl methylcellulose / hypromellose (ISOPTO TEARS / GONIOVISC) 2.5 % ophthalmic solution 1 drop (1 drop Both Eyes Not Given 12/13/16 0911)  hydrALAZINE (APRESOLINE) tablet 100 mg (100 mg Oral Given 12/13/16 0917)  ipratropium-albuterol (DUONEB) 0.5-2.5 (3) MG/3ML nebulizer solution 3 mL (3 mLs Nebulization Given 12/13/16 0958)  albuterol (PROVENTIL) (2.5 MG/3ML) 0.083% nebulizer solution 2.5 mg (not administered)  ipratropium-albuterol (DUONEB) 0.5-2.5 (3) MG/3ML nebulizer solution 3 mL (3 mLs Nebulization Given 12/11/16 1158)  sodium chloride 0.9 % bolus 1,000 mL (0 mLs Intravenous Stopped 12/11/16 1341)  dexamethasone (DECADRON) injection 10 mg (10 mg Intravenous Given 12/11/16 1750)     Initial Impression / Assessment and Plan / ED Course  I have reviewed the triage vital signs and the nursing notes.  Pertinent labs & imaging results that were available during my care of the patient were reviewed by me and considered in my medical decision making (see chart for details).     83 year old who lives alone presents with weakness. Patient was concerned she was having another stroke so presented to the ED for re-evaluation. No focal deficits on neuro exam. Weakness may be secondary to possible dehydration. On exam, the patient's tongue and mucous membranes appear dry. Cr at 1.37, which is the patient's baseline. Cap refill 2-3 seconds. One liter of NS administered in the ED. She reports URI symptoms for the last week. Flu panel on 4/18 ED visit was negative. Patient reports cough and dyspnea has persisted and continued to worsen over the last week. Likely  initial viral process that progressed to pneumonia. CXR negative for consolidations, but may be secondary to dehydration.  WBC 12.8. . Lung exam with scattered wheezes bilateral and rhonchi in the lower fields. Minimal improvement after two nebulizer treatments. Patient ambulated throughout the unit on O2. Nursing staff reports her O2 sats remained in the high 90s, but she became tachypneaic. Discussed and evaluated the patient with Dr. Winfred Leeds, attending physician. Consulted the hospitalist team who will admit the patient.    Final Clinical Impressions(s) / ED Diagnoses   Final diagnoses:  Community acquired pneumonia, unspecified laterality        Jaclyn Carew A Yissel Habermehl, PA-C 12/13/16 [place], MD 12/14/16 1818

## 2016-12-11 NOTE — Telephone Encounter (Signed)
I will watch for ED records

## 2016-12-11 NOTE — H&P (Signed)
History and Physical    Renee [name] UTM:[id number] DOB: February 28, 1929 DOA: 12/11/2016  PCP: [name], MD Patient coming from: Home  Chief Complaint: SOB, lethargy  HPI: Renee [name] is a 83 y.o. female with medical history significant of angioedema on Voltaren, diabetes, hyperlipidemia, hypertension, CVA.  SOB, cough and weakness. Sx started on 4/16. Seen in ED on 4/18 and dx w/ viral respiratory infection. Saw PCP again on 4/20 and again told viral process. Sx progressively worsening. Denies fevers, CP, Palpitations, felt pain, nausea, vomiting, diaphoresis, dysuria, 60, flank pain, neck stiffness, LOC, focal neurological deficit. Inhaler and Robitussin with limited improvement. Patient lives by herself  ED Course: 2 nebulizer treatments and 1 L normal saline administered  Review of Systems: As per HPI otherwise 10 point review of systems negative.   Ambulatory Status: No restrictions at baseline  Past Medical History:  Diagnosis Date  . Angioedema    possibly from voltaren  . Degenerative disc disease   . Diabetes mellitus    type II  . Hyperlipidemia   . Hypertension   . LVH (left ventricular hypertrophy)    and atrial enlargement by echo in past with nl EF  . Nasal pruritis   . Osteoarthritis   . Osteopenia   . Renal insufficiency   . Sleep apnea   . Stroke [facility]) 05/2010   Small vessel sobcortical (in Point trial) with Dr Leonie Man, residual L hemiparesis  . Vitamin B 12 deficiency 04/08    Past Surgical History:  Procedure Laterality Date  . ABDOMINAL HYSTERECTOMY     BSO-fibroids  . APPENDECTOMY    . BACK SURGERY    . COLON SURGERY     due to punctured intestines  . EYE SURGERY     cataract extraction  . KNEE SURGERY     arthroscope  . PARS PLANA VITRECTOMY  07/31/2011   Procedure: PARS PLANA VITRECTOMY WITH 25 GAUGE;  Surgeon: [name], MD;  Location: [facility];  Service: Ophthalmology;  Laterality: Right;  REMOVAL OF SILICONE OIL AND LASER RIGHT EYE  .  RETINAL DETACHMENT SURGERY  02/18/11   times 2  . SPINE SURGERY  08/09   spinal decompression surgery    Social History   Social History  . Marital status: Married    Spouse name: N/A  . Number of children: N/A  . Years of education: N/A   Occupational History  . Not on file.   Social History Main Topics  . Smoking status: Never Smoker  . Smokeless tobacco: Never Used  . Alcohol use No  . Drug use: No  . Sexual activity: No   Other Topics Concern  . Not on file   Social History Narrative  . No narrative on file    Allergies  Allergen Reactions  . Diclofenac Sodium Other (See Comments)    REACTION: angioedema  . Pioglitazone Swelling    Family History  Problem Relation Age of Onset  . COPD Brother   . Cancer Sister     brain tumor with hemmorhage  . Heart disease Sister     CAD    Prior to Admission medications   Medication Sig Start Date End Date Taking? Authorizing Provider  acetaminophen (TYLENOL) 500 MG tablet Take 1,000 mg by mouth at bedtime.    Yes Historical Provider, MD  albuterol (PROVENTIL HFA;VENTOLIN HFA) 108 (90 Base) MCG/ACT inhaler Inhale 2 puffs into the lungs every 4 (four) hours as needed for wheezing. 12/07/16  Yes Roque Lias  A Tower, MD  ALPRAZolam (XANAX) 0.5 MG tablet TAKE 1 TABLET BY MOUTH TWICE A DAY AS NEEDED FOR ANXIETY 10/05/16  Yes Abner Greenspan, MD  amLODipine (NORVASC) 10 MG tablet TAKE 1 TABLET EVERY DAY 03/08/16  Yes Abner Greenspan, MD  aspirin 325 MG tablet Take 325 mg by mouth daily.    Yes Historical Provider, MD  beta carotene w/minerals (OCUVITE) tablet Take 1 tablet by mouth 2 (two) times daily.    Yes Historical Provider, MD  calcium-vitamin D (OSCAL WITH D) 500-200 MG-UNIT per tablet Take 1 tablet by mouth daily.    Yes Historical Provider, MD  Chlorphen-Phenyleph-APAP (CORICIDIN D COLD/FLU/SINUS) 2-5-325 MG TABS Take 1 tablet by mouth daily as needed (cold symptoms).   Yes Historical Provider, MD  Cholecalciferol (VITAMIN D) 1000  UNITS capsule Take 1,000 Units by mouth daily.    Yes Historical Provider, MD  cyanocobalamin (,VITAMIN B-12,) 1000 MCG/ML injection Inject 1,000 mcg into the muscle every 30 (thirty) days.    Yes Historical Provider, MD  docusate sodium (COLACE) 100 MG capsule Take 200 mg by mouth at bedtime.   Yes Historical Provider, MD  ferrous sulfate 325 (65 FE) MG tablet Take 325 mg by mouth 2 (two) times daily.   Yes Historical Provider, MD  gemfibrozil (LOPID) 600 MG tablet TAKE 1 TABLET EVERY DAY 08/17/16  Yes Abner Greenspan, MD  glipiZIDE (GLUCOTROL XL) 2.5 MG 24 hr tablet Take 1 tablet (2.5 mg total) by mouth daily with breakfast. 12/22/15  Yes Abner Greenspan, MD  guaiFENesin (ROBITUSSIN) 100 MG/5ML SOLN Take 5 mLs by mouth every 4 (four) hours as needed for cough or to loosen phlegm.   Yes Historical Provider, MD  hydrALAZINE (APRESOLINE) 100 MG tablet Take 100 mg by mouth 3 (three) times daily.  08/30/14  Yes Historical Provider, MD  metoprolol succinate (TOPROL-XL) 25 MG 24 hr tablet Take 0.5 tablets (12.5 mg total) by mouth 2 (two) times daily. 09/12/16  Yes Abner Greenspan, MD  mirtazapine (REMERON) 15 MG tablet Take 0.5 tablets (7.5 mg total) by mouth at bedtime. 09/12/16  Yes Abner Greenspan, MD  Multiple Vitamin (MULTIVITAMIN) capsule Take 1 capsule by mouth daily.    Yes Historical Provider, MD  Polyvinyl Alcohol-Povidone (REFRESH OP) Place 1 drop into both eyes daily as needed (dry eyes).   Yes Historical Provider, MD  sertraline (ZOLOFT) 50 MG tablet Take 1 tablet (50 mg total) by mouth daily. Patient taking differently: Take 50 mg by mouth every morning.  09/12/16  Yes Abner Greenspan, MD  trandolapril (MAVIK) 4 MG tablet TAKE 1 TABLET EVERY DAY 03/08/16  Yes Abner Greenspan, MD  vitamin B-12 (CYANOCOBALAMIN) 100 MCG tablet Take 100 mcg by mouth daily.   Yes Historical Provider, MD    Physical Exam: Vitals:   12/11/16 1445 12/11/16 1500 12/11/16 1530 12/11/16 1545  BP: (!) 148/54 (!) 153/63 (!) 155/57 (!)  148/55  Pulse: 84 85 82 79  Resp: (!) 26 (!) 22 (!) 23 (!) 22  Temp:      TempSrc:      SpO2: 96% 95% 94% 93%  Weight:      Height:         General:  Appears calm and comfortable Eyes:  PERRL, EOMI, normal lids, iris ENT:  grossly normal hearing, lips & tongue, mmm Neck:  no LAD, masses or thyromegaly Cardiovascular:  RRR, no m/r/g. No LE edema.  Respiratory:  Few centrally located rhonchi with  intermittent wheezing. Normal effort.  Abdomen:  soft, ntnd, NABS Skin:  no rash or induration seen on limited exam Musculoskeletal:  grossly normal tone BUE/BLE, good ROM, no bony abnormality Psychiatric:  grossly normal mood and affect, speech fluent and appropriate, AOx3 Neurologic:  CN 2-12 grossly intact, moves all extremities in coordinated fashion, sensation intact  Labs on Admission: I have personally reviewed following labs and imaging studies  CBC:  Recent Labs Lab 12/05/16 0644 12/11/16 0957  WBC 5.0 12.8*  NEUTROABS 3.4 11.2*  HGB 12.2 12.2  HCT 38.1 37.4  MCV 89.2 88.0  PLT 165 993   Basic Metabolic Panel:  Recent Labs Lab 12/05/16 0644 12/11/16 0957  NA 139 140  K 4.0 4.1  CL 105 109  CO2 23 23  GLUCOSE 173* 138*  BUN 19 22*  CREATININE 1.36* 1.37*  CALCIUM 9.0 9.1   GFR: Estimated Creatinine Clearance: 29.7 mL/min (A) (by C-G formula based on SCr of 1.37 mg/dL (H)). Liver Function Tests:  Recent Labs Lab 12/11/16 0957  AST 19  ALT 13*  ALKPHOS 76  BILITOT 0.4  PROT 5.6*  ALBUMIN 3.5   No results for input(s): LIPASE, AMYLASE in the last 168 hours. No results for input(s): AMMONIA in the last 168 hours. Coagulation Profile: No results for input(s): INR, PROTIME in the last 168 hours. Cardiac Enzymes:  Recent Labs Lab 12/05/16 0644  TROPONINI <0.03   BNP (last 3 results) No results for input(s): PROBNP in the last 8760 hours. HbA1C: No results for input(s): HGBA1C in the last 72 hours. CBG: No results for input(s): GLUCAP in the  last 168 hours. Lipid Profile: No results for input(s): CHOL, HDL, LDLCALC, TRIG, CHOLHDL, LDLDIRECT in the last 72 hours. Thyroid Function Tests: No results for input(s): TSH, T4TOTAL, FREET4, T3FREE, THYROIDAB in the last 72 hours. Anemia Panel: No results for input(s): VITAMINB12, FOLATE, FERRITIN, TIBC, IRON, RETICCTPCT in the last 72 hours. Urine analysis:    Component Value Date/Time   COLORURINE YELLOW 12/05/2016 0818   APPEARANCEUR HAZY (A) 12/05/2016 0818   LABSPEC 1.016 12/05/2016 0818   PHURINE 5.0 12/05/2016 0818   GLUCOSEU NEGATIVE 12/05/2016 0818   HGBUR NEGATIVE 12/05/2016 0818   HGBUR negative 03/27/2010 1358   BILIRUBINUR NEGATIVE 12/05/2016 0818   BILIRUBINUR neg. 06/18/2014 1155   KETONESUR NEGATIVE 12/05/2016 0818   PROTEINUR NEGATIVE 12/05/2016 0818   UROBILINOGEN 0.2 06/18/2014 1155   UROBILINOGEN 0.2 09/04/2012 1135   NITRITE NEGATIVE 12/05/2016 0818   LEUKOCYTESUR LARGE (A) 12/05/2016 0818    Creatinine Clearance: Estimated Creatinine Clearance: 29.7 mL/min (A) (by C-G formula based on SCr of 1.37 mg/dL (H)).  Sepsis Labs: @LABRCNTIP (procalcitonin:4,lacticidven:4) )No results found for this or any previous visit (from the past 240 hour(s)).   Radiological Exams on Admission: Dg Chest 2 View  Result Date: 12/11/2016 CLINICAL DATA:  Cough, weakness for 3 days EXAM: CHEST  2 VIEW COMPARISON:  12/05/2016 FINDINGS: Low lung volumes. Mild cardiomegaly. No confluent opacities or effusions. No acute bony abnormality. IMPRESSION: Low lung volumes.  No active disease. Electronically Signed   By: Rolm Baptise M.D.   On: 12/11/2016 10:43    EKG: Independently reviewed.  Sinus. No acs  Assessment/Plan Active Problems:   Essential hypertension   Bronchopneumonia   Respiratory distress   Tachypnea   Diabetes mellitus with complication (HCC)   History of CVA (cerebrovascular accident)    Respiratory distress: likely from bronchopneumonia. CXR negative but  pt liekly dehydrated. Tachypnea w/o hypoxia. Pt likely  started w/ viral process which has transitioned to bacterial. Pt w/ stated h/o asthma as a child but not formally treated as an adult. Wheezing on exam w/ improvement after nebulizers. Pt unable to care for self as lives alone. Anticipate rapid return to baseline function. - procalcitonin - respiratory cultures - CTX/Azithro - Albuterol Q4, Decadron 10mg  X1 - O2 PRN comfort and hypoxia - MUcinex  HTN: - continue norvasc, Mavik, metop, hydralazine  HLD: - continue Lopid  DM: - SSI  Previous CVA: - continue ASA  Depression/Anxiety: - continue Remeron, Zoloft, xanax  DVT prophylaxis: Hep  Code Status: FULL  Family Communication: friends Disposition Plan: pending improvemenent  Consults called: none  Admission status: observation    Antonious Omahoney J MD [facility]  If 7PM-7AM, please contact night-coverage [email or web address] Password [email or web address]  12/11/2016, 4:44 PM

## 2016-12-12 DIAGNOSIS — F329 Major depressive disorder, single episode, unspecified: Secondary | ICD-10-CM | POA: Diagnosis present

## 2016-12-12 DIAGNOSIS — E118 Type 2 diabetes mellitus with unspecified complications: Secondary | ICD-10-CM

## 2016-12-12 DIAGNOSIS — E785 Hyperlipidemia, unspecified: Secondary | ICD-10-CM | POA: Diagnosis present

## 2016-12-12 DIAGNOSIS — R0902 Hypoxemia: Secondary | ICD-10-CM | POA: Diagnosis present

## 2016-12-12 DIAGNOSIS — R5383 Other fatigue: Secondary | ICD-10-CM | POA: Diagnosis not present

## 2016-12-12 DIAGNOSIS — I10 Essential (primary) hypertension: Secondary | ICD-10-CM

## 2016-12-12 DIAGNOSIS — Z808 Family history of malignant neoplasm of other organs or systems: Secondary | ICD-10-CM | POA: Diagnosis not present

## 2016-12-12 DIAGNOSIS — R0682 Tachypnea, not elsewhere classified: Secondary | ICD-10-CM | POA: Diagnosis not present

## 2016-12-12 DIAGNOSIS — E538 Deficiency of other specified B group vitamins: Secondary | ICD-10-CM | POA: Diagnosis present

## 2016-12-12 DIAGNOSIS — F419 Anxiety disorder, unspecified: Secondary | ICD-10-CM | POA: Diagnosis present

## 2016-12-12 DIAGNOSIS — Z8249 Family history of ischemic heart disease and other diseases of the circulatory system: Secondary | ICD-10-CM | POA: Diagnosis not present

## 2016-12-12 DIAGNOSIS — J154 Pneumonia due to other streptococci: Secondary | ICD-10-CM | POA: Diagnosis present

## 2016-12-12 DIAGNOSIS — A491 Streptococcal infection, unspecified site: Secondary | ICD-10-CM

## 2016-12-12 DIAGNOSIS — R0603 Acute respiratory distress: Secondary | ICD-10-CM

## 2016-12-12 DIAGNOSIS — Z9849 Cataract extraction status, unspecified eye: Secondary | ICD-10-CM | POA: Diagnosis not present

## 2016-12-12 DIAGNOSIS — E119 Type 2 diabetes mellitus without complications: Secondary | ICD-10-CM | POA: Diagnosis present

## 2016-12-12 DIAGNOSIS — E860 Dehydration: Secondary | ICD-10-CM | POA: Diagnosis present

## 2016-12-12 DIAGNOSIS — M1990 Unspecified osteoarthritis, unspecified site: Secondary | ICD-10-CM | POA: Diagnosis present

## 2016-12-12 DIAGNOSIS — Z888 Allergy status to other drugs, medicaments and biological substances status: Secondary | ICD-10-CM | POA: Diagnosis not present

## 2016-12-12 DIAGNOSIS — J180 Bronchopneumonia, unspecified organism: Secondary | ICD-10-CM

## 2016-12-12 DIAGNOSIS — N6019 Diffuse cystic mastopathy of unspecified breast: Secondary | ICD-10-CM | POA: Diagnosis present

## 2016-12-12 DIAGNOSIS — Z7982 Long term (current) use of aspirin: Secondary | ICD-10-CM | POA: Diagnosis not present

## 2016-12-12 DIAGNOSIS — Z825 Family history of asthma and other chronic lower respiratory diseases: Secondary | ICD-10-CM | POA: Diagnosis not present

## 2016-12-12 DIAGNOSIS — Z8673 Personal history of transient ischemic attack (TIA), and cerebral infarction without residual deficits: Secondary | ICD-10-CM

## 2016-12-12 DIAGNOSIS — G4730 Sleep apnea, unspecified: Secondary | ICD-10-CM | POA: Diagnosis present

## 2016-12-12 DIAGNOSIS — Z90710 Acquired absence of both cervix and uterus: Secondary | ICD-10-CM | POA: Diagnosis not present

## 2016-12-12 DIAGNOSIS — M8580 Other specified disorders of bone density and structure, unspecified site: Secondary | ICD-10-CM | POA: Diagnosis present

## 2016-12-12 LAB — BASIC METABOLIC PANEL
Anion gap: 8 (ref 5–15)
BUN: 16 mg/dL (ref 6–20)
CO2: 20 mmol/L — ABNORMAL LOW (ref 22–32)
Calcium: 7.9 mg/dL — ABNORMAL LOW (ref 8.9–10.3)
Chloride: 112 mmol/L — ABNORMAL HIGH (ref 101–111)
Creatinine, Ser: 1.06 mg/dL — ABNORMAL HIGH (ref 0.44–1.00)
GFR calc Af Amer: 54 mL/min — ABNORMAL LOW (ref >60)
GFR calc non Af Amer: 47 mL/min — ABNORMAL LOW (ref >60)
Glucose, Bld: 143 mg/dL — ABNORMAL HIGH (ref 65–99)
Potassium: 4 mmol/L (ref 3.5–5.1)
Sodium: 140 mmol/L (ref 135–145)

## 2016-12-12 LAB — CBC
HCT: 30.6 % — ABNORMAL LOW (ref 36.0–46.0)
Hemoglobin: 10 g/dL — ABNORMAL LOW (ref 12.0–15.0)
MCH: 29.1 pg (ref 26.0–34.0)
MCHC: 32.7 g/dL (ref 30.0–36.0)
MCV: 89 fL (ref 78.0–100.0)
Platelets: 143 10*3/uL — ABNORMAL LOW (ref 150–400)
RBC: 3.44 MIL/uL — ABNORMAL LOW (ref 3.87–5.11)
RDW: 13.9 % (ref 11.5–15.5)
WBC: 7 10*3/uL (ref 4.0–10.5)

## 2016-12-12 LAB — GLUCOSE, CAPILLARY
Glucose-Capillary: 125 mg/dL — ABNORMAL HIGH (ref 65–99)
Glucose-Capillary: 244 mg/dL — ABNORMAL HIGH (ref 65–99)
Glucose-Capillary: 135 mg/dL — ABNORMAL HIGH (ref 65–99)
Glucose-Capillary: 241 mg/dL — ABNORMAL HIGH (ref 65–99)

## 2016-12-12 LAB — STREP PNEUMONIAE URINARY ANTIGEN: Strep Pneumo Urinary Antigen: POSITIVE — AB

## 2016-12-12 LAB — HIV ANTIBODY (ROUTINE TESTING W REFLEX): HIV Screen 4th Generation wRfx: NONREACTIVE

## 2016-12-12 NOTE — Progress Notes (Signed)
PROGRESS NOTE    Renee [name]  EZM:[id number]  DOB: [birth year]  DOA: 12/11/2016 PCP: [name], MD Outpatient Specialists:  Hospital course: HPI: Renee [name] is a 83 y.o. female with medical history significant of angioedema on Voltaren, diabetes, hyperlipidemia, hypertension, CVA.  SOB, cough and weakness. Sx started on 4/16. Seen in ED on 4/18 and dx w/ viral respiratory infection. Saw PCP again on 4/20 and again told viral process. Sx progressively worsening. Denies fevers, CP, Palpitations, felt pain, nausea, vomiting, diaphoresis, dysuria, 60, flank pain, neck stiffness, LOC, focal neurological deficit. Inhaler and Robitussin with limited improvement. Patient lives by herself  ED Course: 2 nebulizer treatments and 1 L normal saline administered  Assessment & Plan:   1. Streptococcal pneumonia - Pt still with SOB and cough, given her advanced age and co-morbidities will continue IV antibiotics and respiratory support for now.  Follow blood cultures.  Continue nebulizer treatments and mucinex.  Ambulate.  PT evaluation.    HTN: - continue norvasc, Mavik, metop, hydralazine  HLD: - continue Lopid  DM: - SSI  Previous CVA: - continue ASA  Depression/Anxiety: - continue Remeron, Zoloft, xanax  DVT prophylaxis: hep Code Status: FULL  Family Communication: bedside Disposition Plan: TBD  Subjective: Pt reports coughing and SOB  Objective: Vitals:   12/11/16 1719 12/11/16 2010 12/11/16 2157 12/12/16 0941  BP: (!) 158/63  (!) 140/54   Pulse: 73  80   Resp: 18  20   Temp: 98.2 F (36.8 C)     TempSrc:      SpO2: 97% 98% 96% 97%  Weight:      Height:        Intake/Output Summary (Last 24 hours) at 12/12/16 1356 Last data filed at 12/12/16 0955  Gross per 24 hour  Intake             1640 ml  Output              200 ml  Net             1440 ml   Filed Weights   12/11/16 0943 12/11/16 1717  Weight: 81.6 kg (180 lb) 81.9 kg (180 lb 9.6 oz)    Exam:  General exam: awake, alert, NAD.   Respiratory system: insp/exp rales.   No increased work of breathing. Cardiovascular system: S1 & S2 heard.   Gastrointestinal system: Abdomen is nondistended, soft and nontender. Normal bowel sounds heard. Central nervous system: Alert and oriented. No focal neurological deficits. Extremities: no cyanosis.  Data Reviewed: Basic Metabolic Panel:  Recent Labs Lab 12/11/16 0957 12/12/16 0600  NA 140 140  K 4.1 4.0  CL 109 112*  CO2 23 20*  GLUCOSE 138* 143*  BUN 22* 16  CREATININE 1.37* 1.06*  CALCIUM 9.1 7.9*   Liver Function Tests:  Recent Labs Lab 12/11/16 0957  AST 19  ALT 13*  ALKPHOS 76  BILITOT 0.4  PROT 5.6*  ALBUMIN 3.5   No results for input(s): LIPASE, AMYLASE in the last 168 hours. No results for input(s): AMMONIA in the last 168 hours. CBC:  Recent Labs Lab 12/11/16 0957 12/12/16 0600  WBC 12.8* 7.0  NEUTROABS 11.2*  --   HGB 12.2 10.0*  HCT 37.4 30.6*  MCV 88.0 89.0  PLT 157 143*   Cardiac Enzymes: No results for input(s): CKTOTAL, CKMB, CKMBINDEX, TROPONINI in the last 168 hours. CBG (last 3)   Recent Labs  12/11/16 2159 12/12/16 0820 12/12/16 1154  GLUCAP  225* 125* 244*   No results found for this or any previous visit (from the past 240 hour(s)).   Studies: Dg Chest 2 View  Result Date: 12/11/2016 CLINICAL DATA:  Cough, weakness for 3 days EXAM: CHEST  2 VIEW COMPARISON:  12/05/2016 FINDINGS: Low lung volumes. Mild cardiomegaly. No confluent opacities or effusions. No acute bony abnormality. IMPRESSION: Low lung volumes.  No active disease. Electronically Signed   By: Rolm Baptise M.D.   On: 12/11/2016 10:43   Scheduled Meds: . amLODipine  10 mg Oral Daily  . aspirin  325 mg Oral Daily  . docusate sodium  200 mg Oral QHS  . gemfibrozil  600 mg Oral Daily  . guaiFENesin  1,200 mg Oral BID  . heparin  5,000 Units Subcutaneous Q8H  . hydrALAZINE  100 mg Oral TID  . hydroxypropyl  methylcellulose / hypromellose  1 drop Both Eyes BID  . insulin aspart  0-5 Units Subcutaneous QHS  . insulin aspart  0-9 Units Subcutaneous TID WC  . ipratropium-albuterol  3 mL Nebulization BID  . metoprolol succinate  12.5 mg Oral BID  . mirtazapine  7.5 mg Oral QHS  . sertraline  50 mg Oral BH-q7a  . trandolapril  4 mg Oral Daily   Continuous Infusions: . azithromycin Stopped (12/11/16 2208)  . cefTRIAXone (ROCEPHIN)  IV Stopped (12/11/16 1836)    Active Problems:   Essential hypertension   Bronchopneumonia   Respiratory distress   Tachypnea   Diabetes mellitus with complication (HCC)   History of CVA (cerebrovascular accident)   Streptococcus pneumoniae  Time spent:   Irwin Brakeman, MD, FAAFP [facility] Pager [phone]  If 7PM-7AM, please contact night-coverage [email or web address] Password [email or web address] 12/12/2016, 1:56 PM    LOS: 0 days

## 2016-12-12 NOTE — Evaluation (Signed)
Physical Therapy Evaluation Patient Details Name: Renee [name] MRN: [id number] DOB: 07-29-1929 Today's Date: 12/12/2016   History of Present Illness  83 y.o. female admitted for SOB, cough and weakness. Dx w/ bacterial pneumonia. PMH significant for angioedema, DM, HLD, HTN, CVA.   Clinical Impression  Pt presents to PT with decreased tolerance for functional mobility due to above diagnosis.  She ambulated in the room with RW requiring min guard assist and appeared SOB after session.  She will benefit from continued skilled therapy in order to increase safety awareness and maximize return to PLOF.  She lives alone and has an aid to help with chores, bathing and dressing 4 hours/day.  Pt may need increased caregiver support at home to ensure safety before D/C. Pt mentioned sister may be available to help but unsure how often. Will follow acutely.    Follow Up Recommendations Supervision/Assistance - 24 hour    Equipment Recommendations  None recommended by PT    Recommendations for Other Services       Precautions / Restrictions Precautions Precautions: Fall (Simultaneous filing. User may not have seen previous data.) Restrictions Weight Bearing Restrictions: No      Mobility  Bed Mobility Overal bed mobility: Needs Assistance Bed Mobility: Supine to Sit     Supine to sit: Supervision     General bed mobility comments: Extra time and cues required to scoot to EOB and get both feet on floor.  No physical assist required.  Transfers Overall transfer level: Needs assistance Equipment used: Rolling walker (2 wheeled) Transfers: Sit to/from Stand Sit to Stand: Min assist         General transfer comment: Cues required to push up with hands from seated surface sit >stand and to reach back for arm rests with stand > sit.  Ambulation/Gait Ambulation/Gait assistance: Min guard Ambulation Distance (Feet): 15 Feet (bed > bathroom > chair) Assistive device: Rolling walker (2  wheeled) Gait Pattern/deviations: Step-through pattern;Decreased stride length;Trunk flexed;Wide base of support Gait velocity: decreased Gait velocity interpretation: Below normal speed for age/gender General Gait Details: Cues required for walker proximity and upright posture.  Stairs            Wheelchair Mobility    Modified Rankin (Stroke Patients Only)       Balance Overall balance assessment: Needs assistance Sitting-balance support: Single extremity supported;Feet supported Sitting balance-Leahy Scale: Fair     Standing balance support: Single extremity supported;During functional activity (standing to wipe after using commode) Standing balance-Leahy Scale: Poor                               Pertinent Vitals/Pain Pain Assessment: No/denies pain    Home Living Family/patient expects to be discharged to:: Private residence Living Arrangements: Alone Available Help at Discharge: Personal care attendant (4 hours a day) Type of Home: House Home Access: Ramped entrance     Home Layout: Two level;Laundry or work area in basement (pt states she does not visit basement) Home Equipment: Bedside commode;Walker - 2 wheels;Cane - single point;Shower seat;Grab bars - tub/shower      Prior Function Level of Independence: Needs assistance   Gait / Transfers Assistance Needed: Uses an AD - either walker or cane  ADL's / Homemaking Assistance Needed: PCA assists with cooking and cleaning as well as ADL's (bathing/dressing). Pt does not drive  Comments: Pt states that PCA comes to help 4 hours/day and sister drives pt to  church and lunch on Sundays.     Hand Dominance        Extremity/Trunk Assessment   Upper Extremity Assessment Upper Extremity Assessment: Defer to OT evaluation    Lower Extremity Assessment Lower Extremity Assessment: Generalized weakness    Cervical / Trunk Assessment Cervical / Trunk Assessment: Kyphotic  Communication    Communication: HOH (hearing aides)  Cognition Arousal/Alertness: Awake/alert Behavior During Therapy: Flat affect Overall Cognitive Status: No family/caregiver present to determine baseline cognitive functioning                                 General Comments: Cognition likely baseline      General Comments General comments (skin integrity, edema, etc.): Pt SOB after returning to recliner    Exercises     Assessment/Plan    PT Assessment Patient needs continued PT services  PT Problem List Decreased strength;Decreased activity tolerance;Decreased balance;Decreased mobility;Decreased coordination;Decreased knowledge of use of DME;Decreased safety awareness;Cardiopulmonary status limiting activity       PT Treatment Interventions DME instruction;Gait training;Functional mobility training;Therapeutic exercise;Balance training;Patient/family education    PT Goals (Current goals can be found in the Care Plan section)  Acute Rehab PT Goals Patient Stated Goal: pt did not state a goal PT Goal Formulation: With patient Time For Goal Achievement: 12/19/16 Potential to Achieve Goals: Good    Frequency Min 3X/week   Barriers to discharge Decreased caregiver support Pt has help 4 hours/day    Co-evaluation               End of Session Equipment Utilized During Treatment: Gait belt Activity Tolerance: Patient limited by fatigue Patient left: in chair;with call bell/phone within reach;with chair alarm set Nurse Communication: Mobility status PT Visit Diagnosis: Unsteadiness on feet (R26.81);Other abnormalities of gait and mobility (R26.89);Muscle weakness (generalized) (M62.81)    Time: 4098-1191 PT Time Calculation (min) (ACUTE ONLY): 23 min   Charges:         PT G Codes:        Gaetano Net SPT  Gaetano Net 12/12/2016, 3:32 PM

## 2016-12-12 NOTE — Progress Notes (Signed)
   12/12/16 1020  Clinical Encounter Type  Visited With Patient  Visit Type Other (Comment) ([place] consult)  Spiritual Encounters  Spiritual Needs Emotional  Stress Factors  Patient Stress Factors None identified  Introduction to Pt. Pt reports she has a copy and family will bring copy by tomorrow.

## 2016-12-12 NOTE — Progress Notes (Signed)
Nutrition Brief Note  Patient identified on the Malnutrition Screening Tool (MST) Report  Wt Readings from Last 15 Encounters:  12/11/16 180 lb 9.6 oz (81.9 kg)  12/07/16 180 lb 4 oz (81.8 kg)  09/12/16 182 lb 8 oz (82.8 kg)  08/31/16 181 lb 12 oz (82.4 kg)  02/24/16 182 lb 12 oz (82.9 kg)  11/21/15 181 lb 8 oz (82.3 kg)  10/19/15 175 lb 4 oz (79.5 kg)  10/10/15 176 lb 9.4 oz (80.1 kg)  10/04/15 175 lb 12.8 oz (79.7 kg)  09/27/15 181 lb 8 oz (82.3 kg)  08/30/15 178 lb 12 oz (81.1 kg)  02/23/15 181 lb 12.8 oz (82.5 kg)  11/23/14 189 lb 6.4 oz (85.9 kg)  11/09/14 168 lb 1.9 oz (76.3 kg)  06/18/14 177 lb 12 oz (80.6 kg)   Renee [name] is a 83 y.o. female with medical history significant of angioedema on Voltaren, diabetes, hyperlipidemia, hypertension, CVA.  SOB, cough and weakness. Sx started on 4/16. Seen in ED on 4/18 and dx w/ viral respiratory infection. Saw PCP again on 4/20 and again told viral process. Sx progressively worsening. Denies fevers, CP, Palpitations, felt pain, nausea, vomiting, diaphoresis, dysuria, 60, flank pain, neck stiffness, LOC, focal neurological deficit. Inhaler and Robitussin with limited improvement. Patient lives by herself  Pt admitted with respiratory distress likely from bronchopneumonia.   Spoke with pt, who reports poor appetite over the past 2-3 days related to shortness of breath. She reports good appetite prior to acute illness. Pt generally consumes 3 meal per day (Breakfast: bagel and eggs, lunch: sandwich, dinner: chicken pot pie). She shares that she has lost 4# within the past week related to poor oral intake; states UBW is around 175#. Wt hx not consistent with wt loss.   Pt consumed 100% of breakfast this AM and states that she feels like her appetite has returned.   Nutrition-Focused physical exam completed. Findings are no fat depletion, no muscle depletion, and no edema.   Discussed importance of good PO intake to support healing. Pt  denies any further nutritional needs, however, expressed appreciation for visit.   Body mass index is 33.03 kg/m. Patient meets criteria for obesity, class I based on current BMI.   Current diet order is regular, patient is consuming approximately 100% of meals at this time. Labs and medications reviewed.   No nutrition interventions warranted at this time. If nutrition issues arise, please consult RD.   Deklyn Trachtenberg A. Jimmye Norman, RD, LDN, CDE Pager: [phone] After hours Pager: [phone]

## 2016-12-13 LAB — GLUCOSE, CAPILLARY
Glucose-Capillary: 121 mg/dL — ABNORMAL HIGH (ref 65–99)
Glucose-Capillary: 227 mg/dL — ABNORMAL HIGH (ref 65–99)

## 2016-12-13 LAB — PROCALCITONIN: Procalcitonin: <0.1 ng/mL

## 2016-12-13 LAB — LEGIONELLA PNEUMOPHILA SEROGP 1 UR AG: L. pneumophila Serogp 1 Ur Ag: NEGATIVE

## 2016-12-13 MED ORDER — DOXYCYCLINE HYCLATE 100 MG PO CAPS: 100.0000 mg | ORAL_CAPSULE | Freq: Two times a day (BID) | ORAL | 0 refills | Status: AC

## 2016-12-13 NOTE — Progress Notes (Signed)
Paged schorr about pt diastolic reading and also to verrify from her if she wants me to give pt her scheduled PO hydralazine and metoprolol, called me back on phone to give it

## 2016-12-13 NOTE — Discharge Summary (Signed)
Physician Discharge Summary  Renee [name] LFY:[id number] DOB: 01/17/1929 DOA: 12/11/2016  PCP: [name], MD  Admit date: 12/11/2016 Discharge date: 12/13/2016  Admitted From: Home  Disposition:  Home   Recommendations for Outpatient Follow-up:  1. Follow up with PCP in 5 days for recheck 2. Please follow up final blood culture results  Discharge Condition: STABLE  CODE STATUS: FULL   Brief/Interim Summary:  HPI: Renee [name] is a 83 y.o. female with medical history significant of angioedema on Voltaren, diabetes, hyperlipidemia, hypertension, CVA.  SOB, cough and weakness. Sx started on 4/16. Seen in ED on 4/18 and dx w/ viral respiratory infection. Saw PCP again on 4/20 and again told viral process. Sx progressively worsening. Denies fevers, CP, Palpitations, felt pain, nausea, vomiting, diaphoresis, dysuria, 60, flank pain, neck stiffness, LOC, focal neurological deficit. Inhaler and Robitussin with limited improvement. Patient lives by herself  ED Course: 2 nebulizer treatments and 1 L normal saline administered    1. Community acquired Streptococcal pneumonia - Pt ambulating better, with less cough and no wheezing heard on exam, Blood cultures no growth to date.  Discharge home on 5 more days of antibiotics.  Follow up with PCP early next week for recheck.  PT evaluated patient and did not recommend SNF rehab.  Pt has someone that helps her at home 4 hours per day and will ask sister to assist as well for more support at home.      HTN: - continue norvasc, Mavik, metop, hydralazine  HLD: - continue Lopid  DM: - SSI  Previous CVA: - continue ASA  Depression/Anxiety: - continue Remeron, Zoloft, xanax  DVT prophylaxis: hep Code Status: FULL  Family Communication: bedside Disposition Plan: TBD   Discharge Diagnoses:  Active Problems:   Essential hypertension   Bronchopneumonia   Respiratory distress   Tachypnea   Diabetes mellitus with complication  (HCC)   History of CVA (cerebrovascular accident)   Streptococcus pneumoniae    Discharge Instructions  Discharge Instructions    Increase activity slowly    Complete by:  As directed      Allergies as of 12/13/2016      Reactions   Diclofenac Sodium Other (See Comments)   REACTION: angioedema   Pioglitazone Swelling      Medication List    STOP taking these medications   CORICIDIN D COLD/FLU/SINUS 2-5-325 MG Tabs Generic drug:  Chlorphen-Phenyleph-APAP     TAKE these medications   acetaminophen 500 MG tablet Commonly known as:  TYLENOL Take 1,000 mg by mouth at bedtime.   albuterol 108 (90 Base) MCG/ACT inhaler Commonly known as:  PROVENTIL HFA;VENTOLIN HFA Inhale 2 puffs into the lungs every 4 (four) hours as needed for wheezing.   ALPRAZolam 0.5 MG tablet Commonly known as:  XANAX TAKE 1 TABLET BY MOUTH TWICE A DAY AS NEEDED FOR ANXIETY   amLODipine 10 MG tablet Commonly known as:  NORVASC TAKE 1 TABLET EVERY DAY   aspirin 325 MG tablet Take 325 mg by mouth daily.   beta carotene w/minerals tablet Take 1 tablet by mouth 2 (two) times daily.   calcium-vitamin D 500-200 MG-UNIT tablet Commonly known as:  OSCAL WITH D Take 1 tablet by mouth daily.   vitamin B-12 100 MCG tablet Commonly known as:  CYANOCOBALAMIN Take 100 mcg by mouth daily.   cyanocobalamin 1000 MCG/ML injection Commonly known as:  (VITAMIN B-12) Inject 1,000 mcg into the muscle every 30 (thirty) days.   docusate sodium 100 MG capsule  Commonly known as:  COLACE Take 200 mg by mouth at bedtime.   doxycycline 100 MG capsule Commonly known as:  VIBRAMYCIN Take 1 capsule (100 mg total) by mouth 2 (two) times daily.   ferrous sulfate 325 (65 FE) MG tablet Take 325 mg by mouth 2 (two) times daily.   gemfibrozil 600 MG tablet Commonly known as:  LOPID TAKE 1 TABLET EVERY DAY   glipiZIDE 2.5 MG 24 hr tablet Commonly known as:  GLUCOTROL XL Take 1 tablet (2.5 mg total) by mouth  daily with breakfast.   guaiFENesin 100 MG/5ML Soln Commonly known as:  ROBITUSSIN Take 5 mLs by mouth every 4 (four) hours as needed for cough or to loosen phlegm.   hydrALAZINE 100 MG tablet Commonly known as:  APRESOLINE Take 100 mg by mouth 3 (three) times daily.   metoprolol succinate 25 MG 24 hr tablet Commonly known as:  TOPROL-XL Take 0.5 tablets (12.5 mg total) by mouth 2 (two) times daily.   mirtazapine 15 MG tablet Commonly known as:  REMERON Take 0.5 tablets (7.5 mg total) by mouth at bedtime.   multivitamin capsule Take 1 capsule by mouth daily.   REFRESH OP Place 1 drop into both eyes daily as needed (dry eyes).   sertraline 50 MG tablet Commonly known as:  ZOLOFT Take 1 tablet (50 mg total) by mouth daily. What changed:  when to take this   trandolapril 4 MG tablet Commonly known as:  MAVIK TAKE 1 TABLET EVERY DAY   Vitamin D 1000 units capsule Take 1,000 Units by mouth daily.      Follow-up Hawkins, MD. Schedule an appointment as soon as possible for a visit in 5 day(s).   Specialties:  Family Medicine, Radiology Why:  Hospital Follow Up  Contact information: [place] [address] [phone]          Allergies  Allergen Reactions  . Diclofenac Sodium Other (See Comments)    REACTION: angioedema  . Pioglitazone Swelling    Procedures/Studies: Dg Chest 2 View  Result Date: 12/11/2016 CLINICAL DATA:  Cough, weakness for 3 days EXAM: CHEST  2 VIEW COMPARISON:  12/05/2016 FINDINGS: Low lung volumes. Mild cardiomegaly. No confluent opacities or effusions. No acute bony abnormality. IMPRESSION: Low lung volumes.  No active disease. Electronically Signed   By: Rolm Baptise M.D.   On: 12/11/2016 10:43   Dg Chest 2 View  Result Date: 12/05/2016 CLINICAL DATA:  Cough and shortness of breath for several days EXAM: CHEST  2 VIEW COMPARISON:  11/21/2015 FINDINGS: Cardiac shadow is enlarged but accentuated  by the frontal technique. The lungs are well aerated bilaterally. No focal infiltrate or sizable effusion is seen. Elevation of the right hemidiaphragm is again noted. No bony abnormality is noted. IMPRESSION: No acute abnormality seen. Electronically Signed   By: [name] M.D.   On: 12/05/2016 07:09    (Echo, Carotid, EGD, Colonoscopy, ERCP)   Subjective: Pt sitting up in chair, ambulated well.  Less coughing and SOB.   Discharge Exam: Vitals:   12/13/16 0908 12/13/16 1000  BP: (!) 143/51   Pulse: 63 76  Resp:  18  Temp:     Vitals:   12/12/16 2126 12/13/16 0451 12/13/16 0908 12/13/16 1000  BP: (!) 127/49 (!) 127/45 (!) 143/51   Pulse: 71 68 63 76  Resp: 18 18  18   Temp: 98.3 F (36.8 C) 97.3 F (36.3 C)  TempSrc:      SpO2: 96% 96%    Weight:      Height:        General exam: awake, alert, NAD.   Respiratory system: clear lungs today.   No increased work of breathing. Cardiovascular system: S1 & S2 heard.   Gastrointestinal system: Abdomen is nondistended, soft and nontender. Normal bowel sounds heard. Central nervous system: Alert and oriented. No focal neurological deficits. Extremities: no cyanosis.  The results of significant diagnostics from this hospitalization (including imaging, microbiology, ancillary and laboratory) are listed below for reference.     Microbiology: Recent Results (from the past 240 hour(s))  Culture, blood (routine x 2) Call MD if unable to obtain prior to antibiotics being given     Status: None (Preliminary result)   Collection Time: 12/11/16  6:41 PM  Result Value Ref Range Status   Specimen Description BLOOD RIGHT ANTECUBITAL  Final   Special Requests   Final    BOTTLES DRAWN AEROBIC AND ANAEROBIC Blood Culture adequate volume   Culture NO GROWTH < 24 HOURS  Final   Report Status PENDING  Incomplete  Culture, blood (routine x 2) Call MD if unable to obtain prior to antibiotics being given     Status: None (Preliminary result)    Collection Time: 12/11/16  6:43 PM  Result Value Ref Range Status   Specimen Description BLOOD RIGHT ANTECUBITAL  Final   Special Requests   Final    BOTTLES DRAWN AEROBIC AND ANAEROBIC Blood Culture adequate volume   Culture NO GROWTH < 24 HOURS  Final   Report Status PENDING  Incomplete     Labs: BNP (last 3 results)  Recent Labs  12/05/16 0644  BNP 42.8   Basic Metabolic Panel:  Recent Labs Lab 12/11/16 0957 12/12/16 0600  NA 140 140  K 4.1 4.0  CL 109 112*  CO2 23 20*  GLUCOSE 138* 143*  BUN 22* 16  CREATININE 1.37* 1.06*  CALCIUM 9.1 7.9*   Liver Function Tests:  Recent Labs Lab 12/11/16 0957  AST 19  ALT 13*  ALKPHOS 76  BILITOT 0.4  PROT 5.6*  ALBUMIN 3.5   No results for input(s): LIPASE, AMYLASE in the last 168 hours. No results for input(s): AMMONIA in the last 168 hours. CBC:  Recent Labs Lab 12/11/16 0957 12/12/16 0600  WBC 12.8* 7.0  NEUTROABS 11.2*  --   HGB 12.2 10.0*  HCT 37.4 30.6*  MCV 88.0 89.0  PLT 157 143*   Cardiac Enzymes: No results for input(s): CKTOTAL, CKMB, CKMBINDEX, TROPONINI in the last 168 hours. BNP: Invalid input(s): POCBNP CBG:  Recent Labs Lab 12/12/16 1154 12/12/16 1720 12/12/16 2129 12/13/16 0800 12/13/16 1200  GLUCAP 244* 135* 241* 121* 227*   D-Dimer No results for input(s): DDIMER in the last 72 hours. Hgb A1c No results for input(s): HGBA1C in the last 72 hours. Lipid Profile No results for input(s): CHOL, HDL, LDLCALC, TRIG, CHOLHDL, LDLDIRECT in the last 72 hours. Thyroid function studies No results for input(s): TSH, T4TOTAL, T3FREE, THYROIDAB in the last 72 hours.  Invalid input(s): FREET3 Anemia work up No results for input(s): VITAMINB12, FOLATE, FERRITIN, TIBC, IRON, RETICCTPCT in the last 72 hours. Urinalysis    Component Value Date/Time   COLORURINE YELLOW 12/05/2016 0818   APPEARANCEUR HAZY (A) 12/05/2016 0818   LABSPEC 1.016 12/05/2016 0818   PHURINE 5.0 12/05/2016 0818    GLUCOSEU NEGATIVE 12/05/2016 0818   HGBUR NEGATIVE 12/05/2016 0818   HGBUR  negative 03/27/2010 1358   [place] 12/05/2016 0818   BILIRUBINUR neg. 06/18/2014 [place] 12/05/2016 0818   PROTEINUR NEGATIVE 12/05/2016 0818   UROBILINOGEN 0.2 06/18/2014 1155   UROBILINOGEN 0.2 09/04/2012 1135   NITRITE NEGATIVE 12/05/2016 0818   LEUKOCYTESUR LARGE (A) 12/05/2016 0818   Sepsis Labs Invalid input(s): PROCALCITONIN,  WBC,  LACTICIDVEN Microbiology Recent Results (from the past 240 hour(s))  Culture, blood (routine x 2) Call MD if unable to obtain prior to antibiotics being given     Status: None (Preliminary result)   Collection Time: 12/11/16  6:41 PM  Result Value Ref Range Status   Specimen Description BLOOD RIGHT ANTECUBITAL  Final   Special Requests   Final    BOTTLES DRAWN AEROBIC AND ANAEROBIC Blood Culture adequate volume   Culture NO GROWTH < 24 HOURS  Final   Report Status PENDING  Incomplete  Culture, blood (routine x 2) Call MD if unable to obtain prior to antibiotics being given     Status: None (Preliminary result)   Collection Time: 12/11/16  6:43 PM  Result Value Ref Range Status   Specimen Description BLOOD RIGHT ANTECUBITAL  Final   Special Requests   Final    BOTTLES DRAWN AEROBIC AND ANAEROBIC Blood Culture adequate volume   Culture NO GROWTH < 24 HOURS  Final   Report Status PENDING  Incomplete   Time coordinating discharge: 32 mins  SIGNED:  Irwin Brakeman, MD  [facility] 12/13/2016, 12:22 PM Pager [phone]  If 7PM-7AM, please contact night-coverage [email or web address] Password [email or web address]

## 2016-12-13 NOTE — Consult Note (Signed)
[facility] CM Primary Care Navigator  12/13/2016  Teva A Ramos 07/25/1929 [id number]   Met with patient at the bedside to identify possible discharge needs. Patient reports having weakness, "jittery" and persistent coughing that had led to this admission.  Patient endorses Dr. Marne Tower with [facility] as the primary care provider.   Patient shared using [facility] at [address] to obtain medications without any problem.   Patient reports managing her medications at home using "pill box" system weekly.   She states that caregivers provide transportation to her doctors' appointments.  Patient lives by herself and verbalized having caregivers ([employer]) at home who assist with her care daily. Her sister (Shirley) who lives nearby is also available to help and support with her care per patient.  Discharge plan is home with caregivers' assistance according to patient.  Patient voiced understanding to call primary care provider's office when she returns home, for a post discharge follow-up appointment within a week or sooner if needed. Patient letter (with PCP's contact number) was provided as a reminder.  Explained to patient about [facility] CM services available for health management. Patient opted and verbally agreed toEMMI Pneumoniacalls to help with her recovery at home.   Referral was made for [facility]MI Pneumonia calls after discharge.  For questions, please contact:   , BSN, RN- [facility] Primary Care Navigator  Telephone: [phone] [facility] 

## 2016-12-13 NOTE — Progress Notes (Signed)
Discharge teaching completed for patient, addressed any questions the patient had. Patient's IV discharged intact. All patient's belongings at bedside. Patient being discharged home with sister.

## 2016-12-14 ENCOUNTER — Telehealth: Payer: Self-pay | Admitting: Family Medicine

## 2016-12-14 ENCOUNTER — Telehealth: Payer: Self-pay | Admitting: *Deleted

## 2016-12-14 ENCOUNTER — Other Ambulatory Visit: Payer: Self-pay | Admitting: Family Medicine

## 2016-12-14 NOTE — Telephone Encounter (Signed)
Patient was released from [facility] last night.  They told her to follow up with Dr.Tower in 5 days.  Patient can only come from 9:30-1:30 due to transportation.  Please advise when to schedule appointment.

## 2016-12-14 NOTE — Telephone Encounter (Signed)
Patient has appointment scheduled on 12/24/16.

## 2016-12-14 NOTE — Telephone Encounter (Signed)
Awesome! Thanks

## 2016-12-14 NOTE — Telephone Encounter (Signed)
Lm requesting return call to complete TCM and confirm hosp f/u appt  

## 2016-12-14 NOTE — Telephone Encounter (Signed)
Please call her to schedule an appt- you will likely need to put 2 appts together to accomodate her  5-7 days if fine with me if she is feeling better Thanks

## 2016-12-16 LAB — CULTURE, BLOOD (ROUTINE X 2)
Culture: NO GROWTH
Culture: NO GROWTH
Special Requests: ADEQUATE
Special Requests: ADEQUATE

## 2016-12-21 ENCOUNTER — Other Ambulatory Visit: Payer: Self-pay | Admitting: Family Medicine

## 2016-12-24 ENCOUNTER — Ambulatory Visit (INDEPENDENT_AMBULATORY_CARE_PROVIDER_SITE_OTHER): Payer: Medicare Other | Admitting: Family Medicine

## 2016-12-24 ENCOUNTER — Encounter: Payer: Self-pay | Admitting: Family Medicine

## 2016-12-24 VITALS — BP 126/56 | HR 71 | Temp 97.8°F | Ht 61.5 in | Wt 179.2 lb

## 2016-12-24 DIAGNOSIS — A491 Streptococcal infection, unspecified site: Secondary | ICD-10-CM

## 2016-12-24 DIAGNOSIS — R531 Weakness: Secondary | ICD-10-CM | POA: Diagnosis not present

## 2016-12-24 DIAGNOSIS — N289 Disorder of kidney and ureter, unspecified: Secondary | ICD-10-CM | POA: Diagnosis not present

## 2016-12-24 DIAGNOSIS — Z7409 Other reduced mobility: Secondary | ICD-10-CM

## 2016-12-24 DIAGNOSIS — I10 Essential (primary) hypertension: Secondary | ICD-10-CM | POA: Diagnosis not present

## 2016-12-24 DIAGNOSIS — J180 Bronchopneumonia, unspecified organism: Secondary | ICD-10-CM

## 2016-12-24 DIAGNOSIS — E538 Deficiency of other specified B group vitamins: Secondary | ICD-10-CM | POA: Diagnosis not present

## 2016-12-24 LAB — COMPREHENSIVE METABOLIC PANEL
ALT: 10 U/L (ref 0–35)
AST: 13 U/L (ref 0–37)
Albumin: 3.7 g/dL (ref 3.5–5.2)
Alkaline Phosphatase: 66 U/L (ref 39–117)
BUN: 32 mg/dL — ABNORMAL HIGH (ref 6–23)
CO2: 26 mEq/L (ref 19–32)
Calcium: 8.8 mg/dL (ref 8.4–10.5)
Chloride: 107 mEq/L (ref 96–112)
Creatinine, Ser: 1.58 mg/dL — ABNORMAL HIGH (ref 0.40–1.20)
GFR: 32.98 mL/min — ABNORMAL LOW (ref >60.00)
Glucose, Bld: 231 mg/dL — ABNORMAL HIGH (ref 70–99)
Potassium: 4.5 mEq/L (ref 3.5–5.1)
Sodium: 138 mEq/L (ref 135–145)
Total Bilirubin: 0.4 mg/dL (ref 0.2–1.2)
Total Protein: 5.5 g/dL — ABNORMAL LOW (ref 6.0–8.3)

## 2016-12-24 LAB — CBC WITH DIFFERENTIAL/PLATELET
Basophils Absolute: 0.1 10*3/uL (ref 0.0–0.1)
Basophils Relative: 1 % (ref 0.0–3.0)
Eosinophils Absolute: 0.3 10*3/uL (ref 0.0–0.7)
Eosinophils Relative: 3.7 % (ref 0.0–5.0)
HCT: 36.1 % (ref 36.0–46.0)
Hemoglobin: 11.8 g/dL — ABNORMAL LOW (ref 12.0–15.0)
Lymphocytes Relative: 17.2 % (ref 12.0–46.0)
Lymphs Abs: 1.2 10*3/uL (ref 0.7–4.0)
MCHC: 32.6 g/dL (ref 30.0–36.0)
MCV: 87.9 fl (ref 78.0–100.0)
Monocytes Absolute: 0.4 10*3/uL (ref 0.1–1.0)
Monocytes Relative: 5.7 % (ref 3.0–12.0)
Neutro Abs: 5.1 10*3/uL (ref 1.4–7.7)
Neutrophils Relative %: 72.4 % (ref 43.0–77.0)
Platelets: 208 10*3/uL (ref 150.0–400.0)
RBC: 4.1 Mil/uL (ref 3.87–5.11)
RDW: 14 % (ref 11.5–15.5)
WBC: 7 10*3/uL (ref 4.0–10.5)

## 2016-12-24 MED ORDER — CYANOCOBALAMIN 1000 MCG/ML IJ SOLN
1000.0000 ug | Freq: Once | INTRAMUSCULAR | Status: AC
  Administered 2016-12-24: 1000 ug via INTRAMUSCULAR

## 2016-12-24 NOTE — Progress Notes (Signed)
Pre visit review using our clinic review tool, if applicable. No additional management support is needed unless otherwise documented below in the visit note. 

## 2016-12-24 NOTE — Assessment & Plan Note (Signed)
From CVA/ making pt home bound  In need of home [facility] PT for help with deconditioning from recent pneumonia  Ref done

## 2016-12-24 NOTE — Patient Instructions (Signed)
Labs today to re check blood count and kidney function  Keep drinking fluids Take frequent breaks from work (especially outdoors)  We will refer you for home PT Glad you are feeling better  If respiratory symptoms return please alert [place]

## 2016-12-24 NOTE — Assessment & Plan Note (Signed)
Monthly B12 shot today 

## 2016-12-24 NOTE — Assessment & Plan Note (Signed)
Reviewed hospital records, lab results and studies in detail from recent CAP Hydrated well  Drinking water  Chem labs today

## 2016-12-24 NOTE — Progress Notes (Signed)
Subjective:    Patient ID: Renee [name], female    DOB: 1929-05-12, 83 y.o.   MRN: [id number]  HPI Here for f/u of hospitalization from 4/24 to 4/26 for CAP (streptococcal) Symptoms worsened several days after visit on 4/20 She presented with cough and weakness   Treated with 2 neb tx and NS in the ED  Dg Chest 2 View  Result Date: 12/11/2016 CLINICAL DATA:  Cough, weakness for 3 days EXAM: CHEST  2 VIEW COMPARISON:  12/05/2016 FINDINGS: Low lung volumes. Mild cardiomegaly. No confluent opacities or effusions. No acute bony abnormality. IMPRESSION: Low lung volumes.  No active disease. Electronically Signed   By: Rolm Baptise M.D.   On: 12/11/2016 10:43   Dg Chest 2 View  Result Date: 12/05/2016 CLINICAL DATA:  Cough and shortness of breath for several days EXAM: CHEST  2 VIEW COMPARISON:  11/21/2015 FINDINGS: Cardiac shadow is enlarged but accentuated by the frontal technique. The lungs are well aerated bilaterally. No focal infiltrate or sizable effusion is seen. Elevation of the right hemidiaphragm is again noted. No bony abnormality is noted. IMPRESSION: No acute abnormality seen. Electronically Signed   By: [name] M.D.   On: 12/05/2016 07:09    She was continued on 5 more days of doxycycline after d/c   Results for orders placed or performed during the hospital encounter of 12/11/16  Culture, blood (routine x 2) Call MD if unable to obtain prior to antibiotics being given  Result Value Ref Range   Specimen Description BLOOD RIGHT ANTECUBITAL    Special Requests      BOTTLES DRAWN AEROBIC AND ANAEROBIC Blood Culture adequate volume   Culture NO GROWTH 5 DAYS    Report Status 12/16/2016 FINAL   Culture, blood (routine x 2) Call MD if unable to obtain prior to antibiotics being given  Result Value Ref Range   Specimen Description BLOOD RIGHT ANTECUBITAL    Special Requests      BOTTLES DRAWN AEROBIC AND ANAEROBIC Blood Culture adequate volume   Culture NO GROWTH 5 DAYS      Report Status 12/16/2016 FINAL   CBC with Differential  Result Value Ref Range   WBC 12.8 (H) 4.0 - 10.5 K/uL   RBC 4.25 3.87 - 5.11 MIL/uL   Hemoglobin 12.2 12.0 - 15.0 g/dL   HCT 37.4 36.0 - 46.0 %   MCV 88.0 78.0 - 100.0 fL   MCH 28.7 26.0 - 34.0 pg   MCHC 32.6 30.0 - 36.0 g/dL   RDW 14.1 11.5 - 15.5 %   Platelets 157 150 - 400 K/uL   Neutrophils Relative % 87 %   Lymphocytes Relative 8 %   Monocytes Relative 4 %   Eosinophils Relative 1 %   Basophils Relative 0 %   Neutro Abs 11.2 (H) 1.7 - 7.7 K/uL   Lymphs Abs 1.0 0.7 - 4.0 K/uL   Monocytes Absolute 0.5 0.1 - 1.0 K/uL   Eosinophils Absolute 0.1 0.0 - 0.7 K/uL   Basophils Absolute 0.0 0.0 - 0.1 K/uL   RBC Morphology MORPHOLOGY UNREMARKABLE   Comprehensive metabolic panel  Result Value Ref Range   Sodium 140 135 - 145 mmol/L   Potassium 4.1 3.5 - 5.1 mmol/L   Chloride 109 101 - 111 mmol/L   CO2 23 22 - 32 mmol/L   Glucose, Bld 138 (H) 65 - 99 mg/dL   BUN 22 (H) 6 - 20 mg/dL   Creatinine, Ser 1.37 (H) 0.44 -  1.00 mg/dL   Calcium 9.1 8.9 - 10.3 mg/dL   Total Protein 5.6 (L) 6.5 - 8.1 g/dL   Albumin 3.5 3.5 - 5.0 g/dL   AST 19 15 - 41 U/L   ALT 13 (L) 14 - 54 U/L   Alkaline Phosphatase 76 38 - 126 U/L   Total Bilirubin 0.4 0.3 - 1.2 mg/dL   GFR calc non Af Amer 34 (L) >60 mL/min   GFR calc Af Amer 40 (L) >60 mL/min   Anion gap 8 5 - 15  Procalcitonin - Baseline  Result Value Ref Range   Procalcitonin <0.10 ng/mL  HIV antibody  Result Value Ref Range   HIV Screen 4th Generation wRfx Non Reactive Non Reactive  Strep pneumoniae urinary antigen  Result Value Ref Range   Strep Pneumo Urinary Antigen POSITIVE (A) NEGATIVE  Legionella Pneumophila Serogp 1 Ur Ag  Result Value Ref Range   L. pneumophila Serogp 1 Ur Ag Negative Negative   Source of Sample URINE, RANDOM   CBC  Result Value Ref Range   WBC 7.0 4.0 - 10.5 K/uL   RBC 3.44 (L) 3.87 - 5.11 MIL/uL   Hemoglobin 10.0 (L) 12.0 - 15.0 g/dL   HCT 30.6 (L)  36.0 - 46.0 %   MCV 89.0 78.0 - 100.0 fL   MCH 29.1 26.0 - 34.0 pg   MCHC 32.7 30.0 - 36.0 g/dL   RDW 13.9 11.5 - 15.5 %   Platelets 143 (L) 150 - 400 K/uL  Basic metabolic panel  Result Value Ref Range   Sodium 140 135 - 145 mmol/L   Potassium 4.0 3.5 - 5.1 mmol/L   Chloride 112 (H) 101 - 111 mmol/L   CO2 20 (L) 22 - 32 mmol/L   Glucose, Bld 143 (H) 65 - 99 mg/dL   BUN 16 6 - 20 mg/dL   Creatinine, Ser 1.06 (H) 0.44 - 1.00 mg/dL   Calcium 7.9 (L) 8.9 - 10.3 mg/dL   GFR calc non Af Amer 47 (L) >60 mL/min   GFR calc Af Amer 54 (L) >60 mL/min   Anion gap 8 5 - 15  Glucose, capillary  Result Value Ref Range   Glucose-Capillary 201 (H) 65 - 99 mg/dL  Glucose, capillary  Result Value Ref Range   Glucose-Capillary 225 (H) 65 - 99 mg/dL   Comment 1 Notify RN    Comment 2 Document in Chart   Glucose, capillary  Result Value Ref Range   Glucose-Capillary 125 (H) 65 - 99 mg/dL  Glucose, capillary  Result Value Ref Range   Glucose-Capillary 244 (H) 65 - 99 mg/dL  Procalcitonin  Result Value Ref Range   Procalcitonin <0.10 ng/mL  Glucose, capillary  Result Value Ref Range   Glucose-Capillary 135 (H) 65 - 99 mg/dL  Glucose, capillary  Result Value Ref Range   Glucose-Capillary 241 (H) 65 - 99 mg/dL  Glucose, capillary  Result Value Ref Range   Glucose-Capillary 121 (H) 65 - 99 mg/dL  Glucose, capillary  Result Value Ref Range   Glucose-Capillary 227 (H) 65 - 99 mg/dL  I-Stat CG4 Lactic Acid, ED  Result Value Ref Range   Lactic Acid, Venous 1.17 0.5 - 1.9 mmol/L    Of note strep pneumo urinary antigen was negative  Wbc mildly elevated  Cr 1.37-stable  Hb 10- poss dillutional  Glucose levels were labile  Breathing is getting better every day  She still feels weak/ would like PT at home (is homebound)  Generally  weak all over  She does get sob after walking - from fatigue/ not wheezing at all   Cough- once in a while  Not bringing anything up (just a tickle in throat)    Done with her doxycycline   No cough medicines  No otc meds   Drinking a lot of fluids/ caregiver is helping her drink her water   Blood cultures were all normal    Also needs her B12 shot today- due for monthly   Patient Active Problem List   Diagnosis Date Noted  . Generalized weakness 12/24/2016  . Streptococcus pneumoniae 12/12/2016  . Bronchopneumonia 12/11/2016  . Respiratory distress 12/11/2016  . Tachypnea 12/11/2016  . Diabetes mellitus with complication ([place]) 00/93/8182  . History of CVA (cerebrovascular accident) 12/11/2016  . Estrogen deficiency 08/30/2015  . Encounter for Medicare annual wellness exam 04/17/2013  . Mobility impaired 06/26/2011  . History of retinal detachment 01/10/2011  . Sleep apnea 11/28/2010  . Depression with anxiety 08/25/2010  . Hemiplegia, late effect of cerebrovascular disease ([place]) 07/05/2010  . POSTHERPETIC NEURALGIA 11/09/2009  . Osteopenia 12/20/2008  . Renal insufficiency 06/29/2008  . BACK PAIN 01/26/2008  . EDEMA 01/26/2008  . B12 deficiency 01/10/2007  . Diabetes type 2, controlled ([place]) 11/27/2006  . HLD (hyperlipidemia) 11/27/2006  . Essential hypertension 11/27/2006  . FIBROCYSTIC BREAST DISEASE 11/27/2006  . ROSACEA 11/27/2006  . OSTEOARTHRITIS 11/27/2006  . URINARY INCONTINENCE, MIXED 11/27/2006   Past Medical History:  Diagnosis Date  . Angioedema    possibly from voltaren  . Bronchopneumonia 12/11/2016  . Degenerative disc disease   . Diabetes mellitus    type II  . Hyperlipidemia   . Hypertension   . LVH (left ventricular hypertrophy)    and atrial enlargement by echo in past with nl EF  . Nasal pruritis   . Osteoarthritis   . Osteopenia   . Renal insufficiency   . Sleep apnea   . Stroke [facility]) 05/2010   Small vessel sobcortical (in [place] trial) with Dr Leonie Man, residual L hemiparesis  . Vitamin B 12 deficiency 04/08   Past Surgical History:  Procedure Laterality Date  . ABDOMINAL HYSTERECTOMY       BSO-fibroids  . APPENDECTOMY    . BACK SURGERY    . COLON SURGERY     due to punctured intestines  . EYE SURGERY     cataract extraction  . KNEE SURGERY     arthroscope  . PARS PLANA VITRECTOMY  07/31/2011   Procedure: PARS PLANA VITRECTOMY WITH 25 GAUGE;  Surgeon: [name], MD;  Location: [place];  Service: Ophthalmology;  Laterality: Right;  REMOVAL OF SILICONE OIL AND LASER RIGHT EYE  . RETINAL DETACHMENT SURGERY  02/18/11   times 2  . SPINE SURGERY  08/09   spinal decompression surgery   Social History  Substance Use Topics  . Smoking status: Never Smoker  . Smokeless tobacco: Never Used  . Alcohol use No   Family History  Problem Relation Age of Onset  . COPD Brother   . Cancer Sister     brain tumor with hemmorhage  . Heart disease Sister     CAD   Allergies  Allergen Reactions  . Diclofenac Sodium Other (See Comments)    REACTION: angioedema  . Pioglitazone Swelling   Current Outpatient Prescriptions on File Prior to Visit  Medication Sig Dispense Refill  . acetaminophen (TYLENOL) 500 MG tablet Take 1,000 mg by mouth at bedtime.     Marland Kitchen  albuterol (PROVENTIL HFA;VENTOLIN HFA) 108 (90 Base) MCG/ACT inhaler Inhale 2 puffs into the lungs every 4 (four) hours as needed for wheezing. 1 Inhaler 3  . ALPRAZolam (XANAX) 0.5 MG tablet TAKE 1 TABLET BY MOUTH TWICE A DAY AS NEEDED FOR ANXIETY 30 tablet 3  . aspirin 325 MG tablet Take 325 mg by mouth daily.     . beta carotene w/minerals (OCUVITE) tablet Take 1 tablet by mouth 2 (two) times daily.     . calcium-vitamin D (OSCAL WITH D) 500-200 MG-UNIT per tablet Take 1 tablet by mouth daily.     . Cholecalciferol (VITAMIN D) 1000 UNITS capsule Take 1,000 Units by mouth daily.     . cyanocobalamin (,VITAMIN B-12,) 1000 MCG/ML injection Inject 1,000 mcg into the muscle every 30 (thirty) days.     Marland Kitchen docusate sodium (COLACE) 100 MG capsule Take 200 mg by mouth at bedtime.    . ferrous sulfate 325 (65 FE) MG tablet Take 325 mg  by mouth 2 (two) times daily.    Marland Kitchen gemfibrozil (LOPID) 600 MG tablet TAKE 1 TABLET EVERY DAY 90 tablet 1  . glipiZIDE (GLUCOTROL XL) 2.5 MG 24 hr tablet TAKE 1 TABLET BY MOUTH DAILY WITH BREAKFAST. 30 tablet 5  . guaiFENesin (ROBITUSSIN) 100 MG/5ML SOLN Take 5 mLs by mouth every 4 (four) hours as needed for cough or to loosen phlegm.    . hydrALAZINE (APRESOLINE) 100 MG tablet Take 100 mg by mouth 3 (three) times daily.     . metoprolol succinate (TOPROL-XL) 25 MG 24 hr tablet Take 0.5 tablets (12.5 mg total) by mouth 2 (two) times daily. 90 tablet 3  . mirtazapine (REMERON) 15 MG tablet Take 0.5 tablets (7.5 mg total) by mouth at bedtime. 45 tablet 3  . Multiple Vitamin (MULTIVITAMIN) capsule Take 1 capsule by mouth daily.     . Polyvinyl Alcohol-Povidone (REFRESH OP) Place 1 drop into both eyes daily as needed (dry eyes).    . sertraline (ZOLOFT) 50 MG tablet Take 1 tablet (50 mg total) by mouth daily. (Patient taking differently: Take 50 mg by mouth every morning. ) 90 tablet 3  . vitamin B-12 (CYANOCOBALAMIN) 100 MCG tablet Take 100 mcg by mouth daily.    Marland Kitchen amLODipine (NORVASC) 10 MG tablet TAKE 1 TABLET EVERY DAY 90 tablet 1  . trandolapril (MAVIK) 4 MG tablet TAKE 1 TABLET EVERY DAY 90 tablet 1   No current facility-administered medications on file prior to visit.     Review of Systems Review of Systems  Constitutional: Negative for fever, appetite change,  and unexpected weight change. pos for fatigue and generalized weakness from recent illness Eyes: Negative for pain and visual disturbance.  Respiratory: Negative for wheeze and shortness of breath.  pos for mild cough that is greatly improved  Cardiovascular: Negative for cp or palpitations    Gastrointestinal: Negative for nausea, diarrhea and constipation.  Genitourinary: Negative for urgency and frequency.  MSK pos for baseline mobility impairment and generalized weakness Skin: Negative for pallor or rash   Neurological: Negative  for, light-headedness, numbness and headaches. pos for baseline hemiparesis  Hematological: Negative for adenopathy. Does not bruise/bleed easily.  Psychiatric/Behavioral: Negative for dysphoric mood. The patient is not nervous/anxious.         Objective:   Physical Exam  Constitutional: She appears well-developed and well-nourished. No distress.  Fatigued appearing elderly female   Unable to get on table today  HENT:  Head: Normocephalic and atraumatic.  Mouth/Throat: Oropharynx  is clear and moist.  Eyes: Conjunctivae and EOM are normal. Pupils are equal, round, and reactive to light. Right eye exhibits no discharge. Left eye exhibits no discharge. No scleral icterus.  Neck: Normal range of motion. Neck supple.  Cardiovascular: Normal rate and regular rhythm.   Pulmonary/Chest: Effort normal and breath sounds normal. No respiratory distress. She has no wheezes. She has no rales. She exhibits no tenderness.  Good air exch No wheeze or prolonged exp phase  No rales   Abdominal: Soft. Bowel sounds are normal.  Musculoskeletal: Normal range of motion. She exhibits no edema or tenderness.  Unable to rise from chair (with arms) w/o assistance today  Not strong enough to get on exam table today  req assistance to walk (using cane)  More unsteady than usual  No change in hemiparesis or fine motor function    Lymphadenopathy:    She has no cervical adenopathy.  Neurological: She is alert. No cranial nerve deficit.  Baseline L hemiparesis    Skin: Skin is warm and dry. No rash noted. No erythema. No pallor.  Psychiatric: She has a normal mood and affect.  Nl affect but seems fatigued           Assessment & Plan:   Problem List Items Addressed This Visit      Cardiovascular and Mediastinum   Essential hypertension - Primary    bp in fair control at this time  BP Readings from Last 1 Encounters:  12/24/16 (!) 126/56   No changes needed Disc lifstyle change with low sodium  diet and exercise          Respiratory   Bronchopneumonia    Reviewed hospital records, lab results and studies in detail  Not seen on CXR but strep pneumo ab found in urine and her symptoms fit criteria resp well to nebs and abx Finished doxycycline Clinically improved but generally weak and feels like she needs HH for PT (homebound) Will ref for this  Watch for any change in resp status       Relevant Orders   Ambulatory referral to [place]   Renal insufficiency    Reviewed hospital records, lab results and studies in detail from recent CAP Hydrated well  Drinking water  Chem labs today       Relevant Orders   Comprehensive metabolic panel (Completed)     Other   B12 deficiency    Monthly B12 shot today      Relevant Medications   cyanocobalamin ((VITAMIN B-12)) injection 1,000 mcg (Completed)   Generalized weakness    s/p CAP Reviewed hospital records, lab results and studies in detail   Is homebound due to mobility impairment from prior CVA Ref done - PT  eval and tx      Relevant Orders   CBC with Differential/Platelet (Completed)   Ambulatory referral to [place] impaired    From CVA/ making pt home bound  In need of home [facility] PT for help with deconditioning from recent pneumonia  Ref done      Streptococcus pneumoniae    Urine pos/ with symptoms of CAP -not resolved Reviewed hospital records, lab results and studies in detail   resp well to abx  Done with doxycycline Re assuring exam  utd on imms        Relevant Orders   CBC with Differential/Platelet (Completed)

## 2016-12-24 NOTE — Assessment & Plan Note (Signed)
Reviewed hospital records, lab results and studies in detail  Not seen on CXR but strep pneumo ab found in urine and her symptoms fit criteria resp well to nebs and abx Finished doxycycline Clinically improved but generally weak and feels like she needs HH for PT (homebound) Will ref for this  Watch for any change in resp status

## 2016-12-24 NOTE — Assessment & Plan Note (Signed)
Urine pos/ with symptoms of CAP -not resolved Reviewed hospital records, lab results and studies in detail   resp well to abx  Done with doxycycline Re assuring exam  utd on imms

## 2016-12-24 NOTE — Assessment & Plan Note (Signed)
s/p CAP Reviewed hospital records, lab results and studies in detail   Is homebound due to mobility impairment from prior CVA Ref done - PT  eval and tx

## 2016-12-24 NOTE — Assessment & Plan Note (Signed)
bp in fair control at this time  BP Readings from Last 1 Encounters:  12/24/16 (!) 126/56   No changes needed Disc lifstyle change with low sodium diet and exercise

## 2016-12-25 ENCOUNTER — Ambulatory Visit: Payer: Medicare Other

## 2016-12-25 ENCOUNTER — Encounter: Payer: Self-pay | Admitting: *Deleted

## 2016-12-26 ENCOUNTER — Telehealth: Payer: Self-pay | Admitting: Family Medicine

## 2016-12-26 DIAGNOSIS — I10 Essential (primary) hypertension: Secondary | ICD-10-CM | POA: Diagnosis not present

## 2016-12-26 DIAGNOSIS — R531 Weakness: Secondary | ICD-10-CM | POA: Diagnosis not present

## 2016-12-26 DIAGNOSIS — E119 Type 2 diabetes mellitus without complications: Secondary | ICD-10-CM | POA: Diagnosis not present

## 2016-12-26 DIAGNOSIS — Z8701 Personal history of pneumonia (recurrent): Secondary | ICD-10-CM | POA: Diagnosis not present

## 2016-12-26 DIAGNOSIS — F419 Anxiety disorder, unspecified: Secondary | ICD-10-CM | POA: Diagnosis not present

## 2016-12-26 DIAGNOSIS — F329 Major depressive disorder, single episode, unspecified: Secondary | ICD-10-CM | POA: Diagnosis not present

## 2016-12-26 NOTE — Telephone Encounter (Signed)
Renee [name] from [facility] called and left a message for Dr. Glori Bickers that she saw and evaluated Renee [name] today. She wants to see her twice a week for 8 wks to work on balance, strengthening, and training but needs orders put in. She can be reached at [phone].

## 2016-12-26 NOTE — Telephone Encounter (Signed)
Please ok those orders verbally -they can send paperwork my way- but I am leaving tomorrow and will return next Wednesday so I may not be able to sign them before then

## 2016-12-27 NOTE — Telephone Encounter (Signed)
Left voicemail letting Renee [name] know Dr. Hilbert Corrigan the verbal orders but if she needed paperwork filled out it wouldn't be done until Dr. Glori Bickers got back in the office next week

## 2016-12-28 DIAGNOSIS — I10 Essential (primary) hypertension: Secondary | ICD-10-CM | POA: Diagnosis not present

## 2016-12-28 DIAGNOSIS — F419 Anxiety disorder, unspecified: Secondary | ICD-10-CM | POA: Diagnosis not present

## 2016-12-28 DIAGNOSIS — Z8701 Personal history of pneumonia (recurrent): Secondary | ICD-10-CM | POA: Diagnosis not present

## 2016-12-28 DIAGNOSIS — E119 Type 2 diabetes mellitus without complications: Secondary | ICD-10-CM | POA: Diagnosis not present

## 2016-12-28 DIAGNOSIS — R531 Weakness: Secondary | ICD-10-CM | POA: Diagnosis not present

## 2016-12-28 DIAGNOSIS — F329 Major depressive disorder, single episode, unspecified: Secondary | ICD-10-CM | POA: Diagnosis not present

## 2017-01-01 DIAGNOSIS — E119 Type 2 diabetes mellitus without complications: Secondary | ICD-10-CM | POA: Diagnosis not present

## 2017-01-01 DIAGNOSIS — I10 Essential (primary) hypertension: Secondary | ICD-10-CM | POA: Diagnosis not present

## 2017-01-01 DIAGNOSIS — F329 Major depressive disorder, single episode, unspecified: Secondary | ICD-10-CM | POA: Diagnosis not present

## 2017-01-01 DIAGNOSIS — F419 Anxiety disorder, unspecified: Secondary | ICD-10-CM | POA: Diagnosis not present

## 2017-01-01 DIAGNOSIS — Z8701 Personal history of pneumonia (recurrent): Secondary | ICD-10-CM | POA: Diagnosis not present

## 2017-01-01 DIAGNOSIS — R531 Weakness: Secondary | ICD-10-CM | POA: Diagnosis not present

## 2017-01-04 DIAGNOSIS — F419 Anxiety disorder, unspecified: Secondary | ICD-10-CM | POA: Diagnosis not present

## 2017-01-04 DIAGNOSIS — F329 Major depressive disorder, single episode, unspecified: Secondary | ICD-10-CM | POA: Diagnosis not present

## 2017-01-04 DIAGNOSIS — E119 Type 2 diabetes mellitus without complications: Secondary | ICD-10-CM | POA: Diagnosis not present

## 2017-01-04 DIAGNOSIS — R531 Weakness: Secondary | ICD-10-CM | POA: Diagnosis not present

## 2017-01-04 DIAGNOSIS — I10 Essential (primary) hypertension: Secondary | ICD-10-CM | POA: Diagnosis not present

## 2017-01-04 DIAGNOSIS — Z8701 Personal history of pneumonia (recurrent): Secondary | ICD-10-CM | POA: Diagnosis not present

## 2017-01-07 ENCOUNTER — Other Ambulatory Visit: Payer: Self-pay | Admitting: Family Medicine

## 2017-01-09 DIAGNOSIS — E119 Type 2 diabetes mellitus without complications: Secondary | ICD-10-CM | POA: Diagnosis not present

## 2017-01-09 DIAGNOSIS — F419 Anxiety disorder, unspecified: Secondary | ICD-10-CM | POA: Diagnosis not present

## 2017-01-09 DIAGNOSIS — Z8701 Personal history of pneumonia (recurrent): Secondary | ICD-10-CM | POA: Diagnosis not present

## 2017-01-09 DIAGNOSIS — R531 Weakness: Secondary | ICD-10-CM | POA: Diagnosis not present

## 2017-01-09 DIAGNOSIS — F329 Major depressive disorder, single episode, unspecified: Secondary | ICD-10-CM | POA: Diagnosis not present

## 2017-01-09 DIAGNOSIS — I10 Essential (primary) hypertension: Secondary | ICD-10-CM | POA: Diagnosis not present

## 2017-01-17 DIAGNOSIS — I10 Essential (primary) hypertension: Secondary | ICD-10-CM | POA: Diagnosis not present

## 2017-01-17 DIAGNOSIS — E119 Type 2 diabetes mellitus without complications: Secondary | ICD-10-CM | POA: Diagnosis not present

## 2017-01-17 DIAGNOSIS — Z8701 Personal history of pneumonia (recurrent): Secondary | ICD-10-CM | POA: Diagnosis not present

## 2017-01-17 DIAGNOSIS — R531 Weakness: Secondary | ICD-10-CM | POA: Diagnosis not present

## 2017-01-17 DIAGNOSIS — F419 Anxiety disorder, unspecified: Secondary | ICD-10-CM | POA: Diagnosis not present

## 2017-01-17 DIAGNOSIS — F329 Major depressive disorder, single episode, unspecified: Secondary | ICD-10-CM | POA: Diagnosis not present

## 2017-01-21 DIAGNOSIS — I10 Essential (primary) hypertension: Secondary | ICD-10-CM | POA: Diagnosis not present

## 2017-01-21 DIAGNOSIS — R531 Weakness: Secondary | ICD-10-CM | POA: Diagnosis not present

## 2017-01-21 DIAGNOSIS — F419 Anxiety disorder, unspecified: Secondary | ICD-10-CM | POA: Diagnosis not present

## 2017-01-21 DIAGNOSIS — F329 Major depressive disorder, single episode, unspecified: Secondary | ICD-10-CM | POA: Diagnosis not present

## 2017-01-21 DIAGNOSIS — Z8701 Personal history of pneumonia (recurrent): Secondary | ICD-10-CM | POA: Diagnosis not present

## 2017-01-21 DIAGNOSIS — E119 Type 2 diabetes mellitus without complications: Secondary | ICD-10-CM | POA: Diagnosis not present

## 2017-01-23 DIAGNOSIS — F329 Major depressive disorder, single episode, unspecified: Secondary | ICD-10-CM | POA: Diagnosis not present

## 2017-01-23 DIAGNOSIS — F419 Anxiety disorder, unspecified: Secondary | ICD-10-CM | POA: Diagnosis not present

## 2017-01-23 DIAGNOSIS — R531 Weakness: Secondary | ICD-10-CM | POA: Diagnosis not present

## 2017-01-23 DIAGNOSIS — I10 Essential (primary) hypertension: Secondary | ICD-10-CM | POA: Diagnosis not present

## 2017-01-23 DIAGNOSIS — E119 Type 2 diabetes mellitus without complications: Secondary | ICD-10-CM | POA: Diagnosis not present

## 2017-01-23 DIAGNOSIS — Z8701 Personal history of pneumonia (recurrent): Secondary | ICD-10-CM | POA: Diagnosis not present

## 2017-01-28 DIAGNOSIS — I10 Essential (primary) hypertension: Secondary | ICD-10-CM | POA: Diagnosis not present

## 2017-01-28 DIAGNOSIS — R531 Weakness: Secondary | ICD-10-CM | POA: Diagnosis not present

## 2017-01-28 DIAGNOSIS — F329 Major depressive disorder, single episode, unspecified: Secondary | ICD-10-CM | POA: Diagnosis not present

## 2017-01-28 DIAGNOSIS — E119 Type 2 diabetes mellitus without complications: Secondary | ICD-10-CM | POA: Diagnosis not present

## 2017-01-28 DIAGNOSIS — Z8701 Personal history of pneumonia (recurrent): Secondary | ICD-10-CM | POA: Diagnosis not present

## 2017-01-28 DIAGNOSIS — F419 Anxiety disorder, unspecified: Secondary | ICD-10-CM | POA: Diagnosis not present

## 2017-01-29 ENCOUNTER — Ambulatory Visit (INDEPENDENT_AMBULATORY_CARE_PROVIDER_SITE_OTHER): Payer: Medicare Other

## 2017-01-29 DIAGNOSIS — E538 Deficiency of other specified B group vitamins: Secondary | ICD-10-CM

## 2017-01-29 MED ORDER — CYANOCOBALAMIN 1000 MCG/ML IJ SOLN
1000.0000 ug | Freq: Once | INTRAMUSCULAR | Status: AC
  Administered 2017-01-29: 1000 ug via INTRAMUSCULAR

## 2017-01-30 ENCOUNTER — Ambulatory Visit (INDEPENDENT_AMBULATORY_CARE_PROVIDER_SITE_OTHER): Payer: Medicare Other | Admitting: Ophthalmology

## 2017-01-30 DIAGNOSIS — E113391 Type 2 diabetes mellitus with moderate nonproliferative diabetic retinopathy without macular edema, right eye: Secondary | ICD-10-CM | POA: Diagnosis not present

## 2017-01-30 DIAGNOSIS — H353122 Nonexudative age-related macular degeneration, left eye, intermediate dry stage: Secondary | ICD-10-CM | POA: Diagnosis not present

## 2017-01-30 DIAGNOSIS — H35033 Hypertensive retinopathy, bilateral: Secondary | ICD-10-CM | POA: Diagnosis not present

## 2017-01-30 DIAGNOSIS — E113291 Type 2 diabetes mellitus with mild nonproliferative diabetic retinopathy without macular edema, right eye: Secondary | ICD-10-CM

## 2017-01-30 DIAGNOSIS — E11319 Type 2 diabetes mellitus with unspecified diabetic retinopathy without macular edema: Secondary | ICD-10-CM

## 2017-01-30 DIAGNOSIS — I10 Essential (primary) hypertension: Secondary | ICD-10-CM

## 2017-01-30 LAB — HM DIABETES EYE EXAM

## 2017-02-01 ENCOUNTER — Other Ambulatory Visit: Payer: Self-pay | Admitting: Family Medicine

## 2017-02-01 DIAGNOSIS — R531 Weakness: Secondary | ICD-10-CM | POA: Diagnosis not present

## 2017-02-01 DIAGNOSIS — E119 Type 2 diabetes mellitus without complications: Secondary | ICD-10-CM | POA: Diagnosis not present

## 2017-02-01 DIAGNOSIS — Z8701 Personal history of pneumonia (recurrent): Secondary | ICD-10-CM | POA: Diagnosis not present

## 2017-02-01 DIAGNOSIS — F419 Anxiety disorder, unspecified: Secondary | ICD-10-CM | POA: Diagnosis not present

## 2017-02-01 DIAGNOSIS — F329 Major depressive disorder, single episode, unspecified: Secondary | ICD-10-CM | POA: Diagnosis not present

## 2017-02-01 DIAGNOSIS — I10 Essential (primary) hypertension: Secondary | ICD-10-CM | POA: Diagnosis not present

## 2017-02-04 DIAGNOSIS — E119 Type 2 diabetes mellitus without complications: Secondary | ICD-10-CM | POA: Diagnosis not present

## 2017-02-04 DIAGNOSIS — R531 Weakness: Secondary | ICD-10-CM | POA: Diagnosis not present

## 2017-02-04 DIAGNOSIS — Z8701 Personal history of pneumonia (recurrent): Secondary | ICD-10-CM | POA: Diagnosis not present

## 2017-02-04 DIAGNOSIS — F329 Major depressive disorder, single episode, unspecified: Secondary | ICD-10-CM | POA: Diagnosis not present

## 2017-02-04 DIAGNOSIS — I10 Essential (primary) hypertension: Secondary | ICD-10-CM | POA: Diagnosis not present

## 2017-02-04 DIAGNOSIS — F419 Anxiety disorder, unspecified: Secondary | ICD-10-CM | POA: Diagnosis not present

## 2017-02-04 NOTE — Telephone Encounter (Signed)
Rx called in as prescribed 

## 2017-02-04 NOTE — Telephone Encounter (Signed)
Px written for call in   

## 2017-02-04 NOTE — Telephone Encounter (Signed)
F/u scheduled for 03/12/17, last filled on 10/05/16 #30 tabs with 3 additional refills, please advise

## 2017-02-06 DIAGNOSIS — E119 Type 2 diabetes mellitus without complications: Secondary | ICD-10-CM | POA: Diagnosis not present

## 2017-02-06 DIAGNOSIS — F419 Anxiety disorder, unspecified: Secondary | ICD-10-CM | POA: Diagnosis not present

## 2017-02-06 DIAGNOSIS — F329 Major depressive disorder, single episode, unspecified: Secondary | ICD-10-CM | POA: Diagnosis not present

## 2017-02-06 DIAGNOSIS — R531 Weakness: Secondary | ICD-10-CM | POA: Diagnosis not present

## 2017-02-06 DIAGNOSIS — I10 Essential (primary) hypertension: Secondary | ICD-10-CM | POA: Diagnosis not present

## 2017-02-06 DIAGNOSIS — Z8701 Personal history of pneumonia (recurrent): Secondary | ICD-10-CM | POA: Diagnosis not present

## 2017-02-11 ENCOUNTER — Telehealth: Payer: Self-pay

## 2017-02-11 NOTE — Telephone Encounter (Signed)
Please ok the verbal order

## 2017-02-11 NOTE — Telephone Encounter (Signed)
Claiborne Billings PT with [facility] left v/m requesting verbal orders to continue [facility] PT 2 x a week for 6 weeks.

## 2017-02-12 NOTE — Telephone Encounter (Signed)
Verbal order given  

## 2017-02-13 DIAGNOSIS — E119 Type 2 diabetes mellitus without complications: Secondary | ICD-10-CM | POA: Diagnosis not present

## 2017-02-13 DIAGNOSIS — Z8701 Personal history of pneumonia (recurrent): Secondary | ICD-10-CM | POA: Diagnosis not present

## 2017-02-13 DIAGNOSIS — F329 Major depressive disorder, single episode, unspecified: Secondary | ICD-10-CM | POA: Diagnosis not present

## 2017-02-13 DIAGNOSIS — R531 Weakness: Secondary | ICD-10-CM | POA: Diagnosis not present

## 2017-02-13 DIAGNOSIS — I10 Essential (primary) hypertension: Secondary | ICD-10-CM | POA: Diagnosis not present

## 2017-02-13 DIAGNOSIS — F419 Anxiety disorder, unspecified: Secondary | ICD-10-CM | POA: Diagnosis not present

## 2017-02-15 DIAGNOSIS — Z8701 Personal history of pneumonia (recurrent): Secondary | ICD-10-CM | POA: Diagnosis not present

## 2017-02-15 DIAGNOSIS — R531 Weakness: Secondary | ICD-10-CM | POA: Diagnosis not present

## 2017-02-15 DIAGNOSIS — F419 Anxiety disorder, unspecified: Secondary | ICD-10-CM | POA: Diagnosis not present

## 2017-02-15 DIAGNOSIS — I10 Essential (primary) hypertension: Secondary | ICD-10-CM | POA: Diagnosis not present

## 2017-02-15 DIAGNOSIS — E119 Type 2 diabetes mellitus without complications: Secondary | ICD-10-CM | POA: Diagnosis not present

## 2017-02-15 DIAGNOSIS — F329 Major depressive disorder, single episode, unspecified: Secondary | ICD-10-CM | POA: Diagnosis not present

## 2017-02-22 DIAGNOSIS — F329 Major depressive disorder, single episode, unspecified: Secondary | ICD-10-CM | POA: Diagnosis not present

## 2017-02-22 DIAGNOSIS — I10 Essential (primary) hypertension: Secondary | ICD-10-CM | POA: Diagnosis not present

## 2017-02-22 DIAGNOSIS — Z8701 Personal history of pneumonia (recurrent): Secondary | ICD-10-CM | POA: Diagnosis not present

## 2017-02-22 DIAGNOSIS — R531 Weakness: Secondary | ICD-10-CM | POA: Diagnosis not present

## 2017-02-22 DIAGNOSIS — E119 Type 2 diabetes mellitus without complications: Secondary | ICD-10-CM | POA: Diagnosis not present

## 2017-02-22 DIAGNOSIS — F419 Anxiety disorder, unspecified: Secondary | ICD-10-CM | POA: Diagnosis not present

## 2017-02-24 DIAGNOSIS — F329 Major depressive disorder, single episode, unspecified: Secondary | ICD-10-CM | POA: Diagnosis not present

## 2017-02-24 DIAGNOSIS — Z8701 Personal history of pneumonia (recurrent): Secondary | ICD-10-CM | POA: Diagnosis not present

## 2017-02-24 DIAGNOSIS — R531 Weakness: Secondary | ICD-10-CM | POA: Diagnosis not present

## 2017-02-24 DIAGNOSIS — I10 Essential (primary) hypertension: Secondary | ICD-10-CM | POA: Diagnosis not present

## 2017-02-24 DIAGNOSIS — E119 Type 2 diabetes mellitus without complications: Secondary | ICD-10-CM | POA: Diagnosis not present

## 2017-02-24 DIAGNOSIS — F419 Anxiety disorder, unspecified: Secondary | ICD-10-CM | POA: Diagnosis not present

## 2017-02-25 ENCOUNTER — Telehealth: Payer: Self-pay

## 2017-02-25 NOTE — Telephone Encounter (Signed)
Please ok that verbal order  

## 2017-02-25 NOTE — Telephone Encounter (Signed)
Wes PT with [facility] left v/m requesting verbal orders for continuation therapy of HH PT 2 x a week for 4 weeks.

## 2017-02-25 NOTE — Telephone Encounter (Signed)
Left voicemail giving Wes the verbal order

## 2017-02-26 DIAGNOSIS — E119 Type 2 diabetes mellitus without complications: Secondary | ICD-10-CM | POA: Diagnosis not present

## 2017-02-26 DIAGNOSIS — I10 Essential (primary) hypertension: Secondary | ICD-10-CM | POA: Diagnosis not present

## 2017-02-26 DIAGNOSIS — F419 Anxiety disorder, unspecified: Secondary | ICD-10-CM | POA: Diagnosis not present

## 2017-02-26 DIAGNOSIS — F329 Major depressive disorder, single episode, unspecified: Secondary | ICD-10-CM | POA: Diagnosis not present

## 2017-02-26 DIAGNOSIS — R531 Weakness: Secondary | ICD-10-CM | POA: Diagnosis not present

## 2017-02-26 DIAGNOSIS — Z8701 Personal history of pneumonia (recurrent): Secondary | ICD-10-CM | POA: Diagnosis not present

## 2017-02-27 DIAGNOSIS — Z8701 Personal history of pneumonia (recurrent): Secondary | ICD-10-CM | POA: Diagnosis not present

## 2017-02-27 DIAGNOSIS — F329 Major depressive disorder, single episode, unspecified: Secondary | ICD-10-CM | POA: Diagnosis not present

## 2017-02-27 DIAGNOSIS — E119 Type 2 diabetes mellitus without complications: Secondary | ICD-10-CM | POA: Diagnosis not present

## 2017-02-27 DIAGNOSIS — R531 Weakness: Secondary | ICD-10-CM | POA: Diagnosis not present

## 2017-02-27 DIAGNOSIS — I10 Essential (primary) hypertension: Secondary | ICD-10-CM | POA: Diagnosis not present

## 2017-02-27 DIAGNOSIS — F419 Anxiety disorder, unspecified: Secondary | ICD-10-CM | POA: Diagnosis not present

## 2017-03-01 ENCOUNTER — Other Ambulatory Visit: Payer: Self-pay

## 2017-03-01 MED ORDER — GLUCOSE BLOOD VI STRP: ORAL_STRIP | 1 refills | Status: DC

## 2017-03-01 NOTE — Telephone Encounter (Signed)
Pt request refill true metrix test strips at [facility]. Spoke with Museum/gallery conservator at [employer] and they do carry true metrix test strips; refilled and pt voiced understanding; pt has f/u appt with Dr Glori Bickers 03/12/17. Last annual 08/2016.

## 2017-03-05 ENCOUNTER — Ambulatory Visit: Payer: Medicare Other

## 2017-03-06 ENCOUNTER — Other Ambulatory Visit: Payer: Self-pay | Admitting: *Deleted

## 2017-03-06 DIAGNOSIS — I10 Essential (primary) hypertension: Secondary | ICD-10-CM | POA: Diagnosis not present

## 2017-03-06 DIAGNOSIS — F329 Major depressive disorder, single episode, unspecified: Secondary | ICD-10-CM | POA: Diagnosis not present

## 2017-03-06 DIAGNOSIS — F419 Anxiety disorder, unspecified: Secondary | ICD-10-CM | POA: Diagnosis not present

## 2017-03-06 DIAGNOSIS — R531 Weakness: Secondary | ICD-10-CM | POA: Diagnosis not present

## 2017-03-06 DIAGNOSIS — Z8701 Personal history of pneumonia (recurrent): Secondary | ICD-10-CM | POA: Diagnosis not present

## 2017-03-06 DIAGNOSIS — E119 Type 2 diabetes mellitus without complications: Secondary | ICD-10-CM | POA: Diagnosis not present

## 2017-03-06 MED ORDER — ACCU-CHEK AVIVA PLUS W/DEVICE KIT: 1.0000 | PACK | Freq: Once | 0 refills | Status: AC

## 2017-03-06 MED ORDER — GLUCOSE BLOOD VI STRP: ORAL_STRIP | 1 refills | Status: DC

## 2017-03-06 MED ORDER — ACCU-CHEK MULTICLIX LANCETS MISC: 1 refills | Status: DC

## 2017-03-06 NOTE — Telephone Encounter (Signed)
Rx sent for new meter and supplies, insurance doesn't cover current meter and supplies, per pharmacy

## 2017-03-08 DIAGNOSIS — E119 Type 2 diabetes mellitus without complications: Secondary | ICD-10-CM | POA: Diagnosis not present

## 2017-03-08 DIAGNOSIS — F329 Major depressive disorder, single episode, unspecified: Secondary | ICD-10-CM | POA: Diagnosis not present

## 2017-03-08 DIAGNOSIS — I10 Essential (primary) hypertension: Secondary | ICD-10-CM | POA: Diagnosis not present

## 2017-03-08 DIAGNOSIS — Z8701 Personal history of pneumonia (recurrent): Secondary | ICD-10-CM | POA: Diagnosis not present

## 2017-03-08 DIAGNOSIS — R531 Weakness: Secondary | ICD-10-CM | POA: Diagnosis not present

## 2017-03-08 DIAGNOSIS — F419 Anxiety disorder, unspecified: Secondary | ICD-10-CM | POA: Diagnosis not present

## 2017-03-11 DIAGNOSIS — R531 Weakness: Secondary | ICD-10-CM | POA: Diagnosis not present

## 2017-03-11 DIAGNOSIS — F419 Anxiety disorder, unspecified: Secondary | ICD-10-CM | POA: Diagnosis not present

## 2017-03-11 DIAGNOSIS — I10 Essential (primary) hypertension: Secondary | ICD-10-CM | POA: Diagnosis not present

## 2017-03-11 DIAGNOSIS — Z8701 Personal history of pneumonia (recurrent): Secondary | ICD-10-CM | POA: Diagnosis not present

## 2017-03-11 DIAGNOSIS — E119 Type 2 diabetes mellitus without complications: Secondary | ICD-10-CM | POA: Diagnosis not present

## 2017-03-11 DIAGNOSIS — F329 Major depressive disorder, single episode, unspecified: Secondary | ICD-10-CM | POA: Diagnosis not present

## 2017-03-12 ENCOUNTER — Encounter: Payer: Self-pay | Admitting: Family Medicine

## 2017-03-12 ENCOUNTER — Ambulatory Visit (INDEPENDENT_AMBULATORY_CARE_PROVIDER_SITE_OTHER): Payer: Medicare Other | Admitting: Family Medicine

## 2017-03-12 ENCOUNTER — Ambulatory Visit: Payer: Medicare Other | Admitting: Family Medicine

## 2017-03-12 VITALS — BP 128/68 | HR 63 | Temp 97.5°F | Ht 61.5 in | Wt 181.5 lb

## 2017-03-12 DIAGNOSIS — E7800 Pure hypercholesterolemia, unspecified: Secondary | ICD-10-CM

## 2017-03-12 DIAGNOSIS — E538 Deficiency of other specified B group vitamins: Secondary | ICD-10-CM

## 2017-03-12 DIAGNOSIS — I10 Essential (primary) hypertension: Secondary | ICD-10-CM | POA: Diagnosis not present

## 2017-03-12 DIAGNOSIS — N289 Disorder of kidney and ureter, unspecified: Secondary | ICD-10-CM

## 2017-03-12 DIAGNOSIS — E11319 Type 2 diabetes mellitus with unspecified diabetic retinopathy without macular edema: Secondary | ICD-10-CM | POA: Diagnosis not present

## 2017-03-12 DIAGNOSIS — E1165 Type 2 diabetes mellitus with hyperglycemia: Secondary | ICD-10-CM | POA: Diagnosis not present

## 2017-03-12 LAB — CBC WITH DIFFERENTIAL/PLATELET
Basophils Absolute: 0.1 10*3/uL (ref 0.0–0.1)
Basophils Relative: 1.2 % (ref 0.0–3.0)
Eosinophils Absolute: 0.4 10*3/uL (ref 0.0–0.7)
Eosinophils Relative: 7.3 % — ABNORMAL HIGH (ref 0.0–5.0)
HCT: 36.9 % (ref 36.0–46.0)
Hemoglobin: 12.1 g/dL (ref 12.0–15.0)
Lymphocytes Relative: 23.7 % (ref 12.0–46.0)
Lymphs Abs: 1.3 10*3/uL (ref 0.7–4.0)
MCHC: 32.8 g/dL (ref 30.0–36.0)
MCV: 89 fl (ref 78.0–100.0)
Monocytes Absolute: 0.4 10*3/uL (ref 0.1–1.0)
Monocytes Relative: 6.8 % (ref 3.0–12.0)
Neutro Abs: 3.4 10*3/uL (ref 1.4–7.7)
Neutrophils Relative %: 61 % (ref 43.0–77.0)
Platelets: 193 10*3/uL (ref 150.0–400.0)
RBC: 4.15 Mil/uL (ref 3.87–5.11)
RDW: 14.9 % (ref 11.5–15.5)
WBC: 5.6 10*3/uL (ref 4.0–10.5)

## 2017-03-12 LAB — LIPID PANEL
Cholesterol: 172 mg/dL (ref 0–200)
HDL: 37 mg/dL — ABNORMAL LOW (ref >39.00)
NonHDL: 134.97
Total CHOL/HDL Ratio: 5
Triglycerides: 266 mg/dL — ABNORMAL HIGH (ref 0.0–149.0)
VLDL: 53.2 mg/dL — ABNORMAL HIGH (ref 0.0–40.0)

## 2017-03-12 LAB — COMPREHENSIVE METABOLIC PANEL
ALT: 12 U/L (ref 0–35)
AST: 15 U/L (ref 0–37)
Albumin: 3.7 g/dL (ref 3.5–5.2)
Alkaline Phosphatase: 68 U/L (ref 39–117)
BUN: 25 mg/dL — ABNORMAL HIGH (ref 6–23)
CO2: 27 mEq/L (ref 19–32)
Calcium: 8.8 mg/dL (ref 8.4–10.5)
Chloride: 108 mEq/L (ref 96–112)
Creatinine, Ser: 1.5 mg/dL — ABNORMAL HIGH (ref 0.40–1.20)
GFR: 35 mL/min — ABNORMAL LOW (ref >60.00)
Glucose, Bld: 217 mg/dL — ABNORMAL HIGH (ref 70–99)
Potassium: 4.5 mEq/L (ref 3.5–5.1)
Sodium: 141 mEq/L (ref 135–145)
Total Bilirubin: 0.3 mg/dL (ref 0.2–1.2)
Total Protein: 5.4 g/dL — ABNORMAL LOW (ref 6.0–8.3)

## 2017-03-12 LAB — HEMOGLOBIN A1C: Hgb A1c MFr Bld: 5.7 % (ref 4.6–6.5)

## 2017-03-12 LAB — VITAMIN B12: Vitamin B-12: >1500 pg/mL — ABNORMAL HIGH (ref 211–911)

## 2017-03-12 LAB — LDL CHOLESTEROL, DIRECT: Direct LDL: 98 mg/dL

## 2017-03-12 MED ORDER — CYANOCOBALAMIN 1000 MCG/ML IJ SOLN
1000.0000 ug | Freq: Once | INTRAMUSCULAR | Status: AC
  Administered 2017-03-12: 1000 ug via INTRAMUSCULAR

## 2017-03-12 NOTE — Assessment & Plan Note (Signed)
Followed by nephropathy Avoiding nsaids and also diuretics Rev fluid intake

## 2017-03-12 NOTE — Assessment & Plan Note (Signed)
A1C today  Well controlled but eating ice cream lately  Wt is up a few lb More active also  Dx with retinopathy

## 2017-03-12 NOTE — Assessment & Plan Note (Signed)
Disc goals for lipids and reasons to control them Rev labs with pt Rev low sat fat diet in detail Lipid panel today  On gemfibrozil

## 2017-03-12 NOTE — Assessment & Plan Note (Signed)
B12 shot today  Level today

## 2017-03-12 NOTE — Assessment & Plan Note (Signed)
bp in fair control at this time  BP Readings from Last 1 Encounters:  03/12/17 128/68   No changes needed Disc lifstyle change with low sodium diet and exercise  Pt c/o of mild chronic ankle swelling No cardiac symptoms  Recommend leg elevation She declines supp hose No diuretic in light of renal insuff

## 2017-03-12 NOTE — Assessment & Plan Note (Signed)
Eye exam 6/18

## 2017-03-12 NOTE — Patient Instructions (Signed)
Labs today   B12 shot today   Keep doing your physical therapy   Elevate your legs when you sit   Try to eat less sugar   Follow up in 6 months

## 2017-03-12 NOTE — Progress Notes (Signed)
Subjective:    Patient ID: Renee [name], female    DOB: [birth year], 83 y.o.   MRN: [id number]  HPI Here for f/u of chronic health problems and B12 shot   Doing pretty good overall  Canning pickles  Also froze some corn   Wt Readings from Last 3 Encounters:  03/12/17 181 lb 8 oz (82.3 kg)  12/24/16 179 lb 4 oz (81.3 kg)  12/11/16 180 lb 9.6 oz (81.9 kg)  appetite is good  Energy level is good  33.74 kg/m   Still taking PT  Getting stronger    bp is stable today  No cp or palpitations or headaches or edema  No side effects to medicines  BP Readings from Last 3 Encounters:  03/12/17 128/68  12/24/16 (!) 126/56  12/13/16 (!) 131/49     Diabetes Home sugar results - 109 this am /standand  DM diet - eating more home made ice cream, cobbler/ avoids other sweets  Snacking a little  Exercise -doing more /PT  Symptoms A1C last  Lab Results  Component Value Date   HGBA1C 5.6 08/31/2016  due for labs   No problems with medications  Renal protection ace  Last eye exam  6/18 with retinopathy   Hx of renal insuff  Sees renal Lab Results  Component Value Date   CREATININE 1.58 (H) 12/24/2016   BUN 32 (H) 12/24/2016   NA 138 12/24/2016   K 4.5 12/24/2016   CL 107 12/24/2016   CO2 26 12/24/2016  she makes effort to drink water  At least 4   16 oz of water Sips coffee also   B12 def Due for B12 shot  Lab Results  Component Value Date   VITAMINB12 187 (L) 08/31/2016   Hyperlipidemia Lab Results  Component Value Date   CHOL 179 08/31/2016   HDL 37.90 (L) 08/31/2016   LDLCALC 124 (H) 02/18/2015   LDLDIRECT 88.0 08/31/2016   TRIG 214.0 (H) 08/31/2016   CHOLHDL 5 08/31/2016   On lopid for triglyceride control Due for labs today   Patient Active Problem List   Diagnosis Date Noted  . Generalized weakness 12/24/2016  . Respiratory distress 12/11/2016  . Tachypnea 12/11/2016  . Diabetic retinopathy ([place]) 12/11/2016  . History of CVA (cerebrovascular  accident) 12/11/2016  . Estrogen deficiency 08/30/2015  . Encounter for Medicare annual wellness exam 04/17/2013  . Mobility impaired 06/26/2011  . History of retinal detachment 01/10/2011  . Sleep apnea 11/28/2010  . Depression with anxiety 08/25/2010  . Hemiplegia, late effect of cerebrovascular disease ([place]) 07/05/2010  . POSTHERPETIC NEURALGIA 11/09/2009  . Osteopenia 12/20/2008  . Renal insufficiency 06/29/2008  . BACK PAIN 01/26/2008  . EDEMA 01/26/2008  . B12 deficiency 01/10/2007  . Diabetes type 2, controlled ([place]) 11/27/2006  . HLD (hyperlipidemia) 11/27/2006  . Essential hypertension 11/27/2006  . FIBROCYSTIC BREAST DISEASE 11/27/2006  . ROSACEA 11/27/2006  . OSTEOARTHRITIS 11/27/2006  . URINARY INCONTINENCE, MIXED 11/27/2006   Past Medical History:  Diagnosis Date  . Angioedema    possibly from voltaren  . Bronchopneumonia 12/11/2016  . Degenerative disc disease   . Diabetes mellitus    type II  . Hyperlipidemia   . Hypertension   . LVH (left ventricular hypertrophy)    and atrial enlargement by echo in past with nl EF  . Nasal pruritis   . Osteoarthritis   . Osteopenia   . Renal insufficiency   . Sleep apnea   . Stroke [facility]) 05/2010  Small vessel sobcortical (in [place] trial) with Dr Leonie Man, residual L hemiparesis  . Vitamin B 12 deficiency 04/08   Past Surgical History:  Procedure Laterality Date  . ABDOMINAL HYSTERECTOMY     BSO-fibroids  . APPENDECTOMY    . BACK SURGERY    . COLON SURGERY     due to punctured intestines  . EYE SURGERY     cataract extraction  . KNEE SURGERY     arthroscope  . PARS PLANA VITRECTOMY  07/31/2011   Procedure: PARS PLANA VITRECTOMY WITH 25 GAUGE;  Surgeon: [name], MD;  Location: [place];  Service: Ophthalmology;  Laterality: Right;  REMOVAL OF SILICONE OIL AND LASER RIGHT EYE  . RETINAL DETACHMENT SURGERY  02/18/11   times 2  . SPINE SURGERY  08/09   spinal decompression surgery   Social History  Substance  Use Topics  . Smoking status: Never Smoker  . Smokeless tobacco: Never Used  . Alcohol use No   Family History  Problem Relation Age of Onset  . COPD Brother   . Cancer Sister        brain tumor with hemmorhage  . Heart disease Sister        CAD   Allergies  Allergen Reactions  . Diclofenac Sodium Other (See Comments)    REACTION: angioedema  . Pioglitazone Swelling   Current Outpatient Prescriptions on File Prior to Visit  Medication Sig Dispense Refill  . acetaminophen (TYLENOL) 500 MG tablet Take 1,000 mg by mouth at bedtime.     Marland Kitchen albuterol (PROVENTIL HFA;VENTOLIN HFA) 108 (90 Base) MCG/ACT inhaler Inhale 2 puffs into the lungs every 4 (four) hours as needed for wheezing. 1 Inhaler 3  . ALPRAZolam (XANAX) 0.5 MG tablet TAKE 1 TABLET TWICE A DAY AS NEEDED ANXIETY 30 tablet 3  . amLODipine (NORVASC) 10 MG tablet TAKE 1 TABLET EVERY DAY 90 tablet 1  . aspirin 325 MG tablet Take 325 mg by mouth daily.     . beta carotene w/minerals (OCUVITE) tablet Take 1 tablet by mouth 2 (two) times daily.     . calcium-vitamin D (OSCAL WITH D) 500-200 MG-UNIT per tablet Take 1 tablet by mouth daily.     . Cholecalciferol (VITAMIN D) 1000 UNITS capsule Take 1,000 Units by mouth daily.     . cyanocobalamin (,VITAMIN B-12,) 1000 MCG/ML injection Inject 1,000 mcg into the muscle every 30 (thirty) days.     Marland Kitchen docusate sodium (COLACE) 100 MG capsule Take 200 mg by mouth at bedtime.    . ferrous sulfate 325 (65 FE) MG tablet Take 325 mg by mouth 2 (two) times daily.    Marland Kitchen gemfibrozil (LOPID) 600 MG tablet TAKE 1 TABLET EVERY DAY 90 tablet 0  . glipiZIDE (GLUCOTROL XL) 2.5 MG 24 hr tablet TAKE 1 TABLET BY MOUTH DAILY WITH BREAKFAST. 30 tablet 5  . glucose blood (ACCU-CHEK AVIVA PLUS) test strip Use to check blood sugar once daily and as directed. Dx E11.9 100 each 1  . guaiFENesin (ROBITUSSIN) 100 MG/5ML SOLN Take 5 mLs by mouth every 4 (four) hours as needed for cough or to loosen phlegm.    .  hydrALAZINE (APRESOLINE) 100 MG tablet Take 100 mg by mouth 3 (three) times daily.     . Lancets (ACCU-CHEK MULTICLIX) lancets Use to check blood sugar once daily and as directed. Dx E11.9 102 each 1  . metoprolol succinate (TOPROL-XL) 25 MG 24 hr tablet Take 0.5 tablets (12.5 mg total) by  mouth 2 (two) times daily. 90 tablet 3  . mirtazapine (REMERON) 15 MG tablet Take 0.5 tablets (7.5 mg total) by mouth at bedtime. 45 tablet 3  . Multiple Vitamin (MULTIVITAMIN) capsule Take 1 capsule by mouth daily.     . Polyvinyl Alcohol-Povidone (REFRESH OP) Place 1 drop into both eyes daily as needed (dry eyes).    . sertraline (ZOLOFT) 50 MG tablet Take 1 tablet (50 mg total) by mouth daily. (Patient taking differently: Take 50 mg by mouth every morning. ) 90 tablet 3  . trandolapril (MAVIK) 4 MG tablet TAKE 1 TABLET EVERY DAY 90 tablet 1  . vitamin B-12 (CYANOCOBALAMIN) 100 MCG tablet Take 100 mcg by mouth daily.     No current facility-administered medications on file prior to visit.     Review of Systems    Review of Systems  Constitutional: Negative for fever, appetite change, fatigue and unexpected weight change. pos for generalized weakness imp with PT  Eyes: Negative for pain and visual disturbance.  Respiratory: Negative for cough and shortness of breath.   Cardiovascular: Negative for cp or palpitations    Gastrointestinal: Negative for nausea, diarrhea and constipation.  Genitourinary: Negative for urgency and frequency.  Skin: Negative for pallor or rash   Neurological: Negative for , light-headedness, numbness and headaches.  Hematological: Negative for adenopathy. Does not bruise/bleed easily.  Psychiatric/Behavioral: Negative for dysphoric mood. The patient is not nervous/anxious.      Objective:   Physical Exam  Constitutional: She appears well-developed and well-nourished. No distress.  Frail appearing elderly female  HENT:  Head: Normocephalic and atraumatic.  Mouth/Throat:  Oropharynx is clear and moist.  TMs clear No cerumen  Eyes: Pupils are equal, round, and reactive to light. Conjunctivae and EOM are normal. No scleral icterus.  Neck: Normal range of motion. Neck supple. No JVD present. Carotid bruit is not present. No thyromegaly present.  Cardiovascular: Normal rate, regular rhythm, normal heart sounds and intact distal pulses.  Exam reveals no gallop.   Pulmonary/Chest: Effort normal and breath sounds normal. No respiratory distress. She has no wheezes. She has no rales.  No crackles  Abdominal: Soft. Bowel sounds are normal. She exhibits no distension, no abdominal bruit and no mass. There is no tenderness.  Musculoskeletal: She exhibits no edema.  Lymphadenopathy:    She has no cervical adenopathy.  Neurological: She is alert. She has normal reflexes. No cranial nerve deficit. She exhibits normal muscle tone.  Baseline hemiplegia   Skin: Skin is warm and dry. No rash noted. No pallor.  Psychiatric: She has a normal mood and affect.          Assessment & Plan:   Problem List Items Addressed This Visit      Cardiovascular and Mediastinum   Essential hypertension - Primary    bp in fair control at this time  BP Readings from Last 1 Encounters:  03/12/17 128/68   No changes needed Disc lifstyle change with low sodium diet and exercise  Pt c/o of mild chronic ankle swelling No cardiac symptoms  Recommend leg elevation She declines supp hose No diuretic in light of renal insuff      Relevant Orders   CBC with Differential/Platelet   Comprehensive metabolic panel   Lipid panel     Endocrine   Diabetes type 2, controlled ([place])    A1C today  Well controlled but eating ice cream lately  Wt is up a few lb More active also  Dx with  retinopathy        Relevant Orders   Hemoglobin A1c   Diabetic retinopathy ([place])    Eye exam 6/18         Genitourinary   Renal insufficiency    Followed by nephropathy Avoiding nsaids and also  diuretics Rev fluid intake       Relevant Orders   Comprehensive metabolic panel     Other   B12 deficiency    B12 shot today  Level today      Relevant Medications   cyanocobalamin ((VITAMIN B-12)) injection 1,000 mcg (Completed)   Other Relevant Orders   Vitamin B12   HLD (hyperlipidemia)    Disc goals for lipids and reasons to control them Rev labs with pt Rev low sat fat diet in detail Lipid panel today  On gemfibrozil        Relevant Orders   Lipid panel

## 2017-03-13 ENCOUNTER — Encounter: Payer: Self-pay | Admitting: *Deleted

## 2017-03-13 DIAGNOSIS — E119 Type 2 diabetes mellitus without complications: Secondary | ICD-10-CM | POA: Diagnosis not present

## 2017-03-13 DIAGNOSIS — Z8701 Personal history of pneumonia (recurrent): Secondary | ICD-10-CM | POA: Diagnosis not present

## 2017-03-13 DIAGNOSIS — F419 Anxiety disorder, unspecified: Secondary | ICD-10-CM | POA: Diagnosis not present

## 2017-03-13 DIAGNOSIS — I10 Essential (primary) hypertension: Secondary | ICD-10-CM | POA: Diagnosis not present

## 2017-03-13 DIAGNOSIS — R531 Weakness: Secondary | ICD-10-CM | POA: Diagnosis not present

## 2017-03-13 DIAGNOSIS — F329 Major depressive disorder, single episode, unspecified: Secondary | ICD-10-CM | POA: Diagnosis not present

## 2017-03-14 ENCOUNTER — Other Ambulatory Visit: Payer: Self-pay | Admitting: Family Medicine

## 2017-03-19 DIAGNOSIS — F329 Major depressive disorder, single episode, unspecified: Secondary | ICD-10-CM | POA: Diagnosis not present

## 2017-03-19 DIAGNOSIS — R809 Proteinuria, unspecified: Secondary | ICD-10-CM | POA: Diagnosis not present

## 2017-03-19 DIAGNOSIS — R531 Weakness: Secondary | ICD-10-CM | POA: Diagnosis not present

## 2017-03-19 DIAGNOSIS — Z8701 Personal history of pneumonia (recurrent): Secondary | ICD-10-CM | POA: Diagnosis not present

## 2017-03-19 DIAGNOSIS — F419 Anxiety disorder, unspecified: Secondary | ICD-10-CM | POA: Diagnosis not present

## 2017-03-19 DIAGNOSIS — I10 Essential (primary) hypertension: Secondary | ICD-10-CM | POA: Diagnosis not present

## 2017-03-19 DIAGNOSIS — N183 Chronic kidney disease, stage 3 (moderate): Secondary | ICD-10-CM | POA: Diagnosis not present

## 2017-03-19 DIAGNOSIS — E119 Type 2 diabetes mellitus without complications: Secondary | ICD-10-CM | POA: Diagnosis not present

## 2017-03-19 DIAGNOSIS — R600 Localized edema: Secondary | ICD-10-CM | POA: Diagnosis not present

## 2017-03-22 DIAGNOSIS — R531 Weakness: Secondary | ICD-10-CM | POA: Diagnosis not present

## 2017-03-22 DIAGNOSIS — E119 Type 2 diabetes mellitus without complications: Secondary | ICD-10-CM | POA: Diagnosis not present

## 2017-03-22 DIAGNOSIS — I10 Essential (primary) hypertension: Secondary | ICD-10-CM | POA: Diagnosis not present

## 2017-03-22 DIAGNOSIS — F329 Major depressive disorder, single episode, unspecified: Secondary | ICD-10-CM | POA: Diagnosis not present

## 2017-03-22 DIAGNOSIS — F419 Anxiety disorder, unspecified: Secondary | ICD-10-CM | POA: Diagnosis not present

## 2017-03-22 DIAGNOSIS — Z8701 Personal history of pneumonia (recurrent): Secondary | ICD-10-CM | POA: Diagnosis not present

## 2017-04-05 ENCOUNTER — Other Ambulatory Visit: Payer: Self-pay | Admitting: *Deleted

## 2017-04-05 MED ORDER — GLIPIZIDE ER 2.5 MG PO TB24: 2.5000 mg | ORAL_TABLET | Freq: Every day | ORAL | 1 refills | Status: DC

## 2017-04-16 ENCOUNTER — Ambulatory Visit (INDEPENDENT_AMBULATORY_CARE_PROVIDER_SITE_OTHER): Payer: Medicare Other

## 2017-04-16 DIAGNOSIS — E538 Deficiency of other specified B group vitamins: Secondary | ICD-10-CM | POA: Diagnosis not present

## 2017-04-16 MED ORDER — CYANOCOBALAMIN 1000 MCG/ML IJ SOLN
1000.0000 ug | Freq: Once | INTRAMUSCULAR | Status: AC
  Administered 2017-04-16: 1000 ug via INTRAMUSCULAR

## 2017-05-14 ENCOUNTER — Other Ambulatory Visit: Payer: Self-pay | Admitting: Family Medicine

## 2017-05-21 ENCOUNTER — Ambulatory Visit: Payer: Medicare Other

## 2017-05-22 ENCOUNTER — Ambulatory Visit (INDEPENDENT_AMBULATORY_CARE_PROVIDER_SITE_OTHER): Payer: Medicare Other

## 2017-05-22 DIAGNOSIS — E538 Deficiency of other specified B group vitamins: Secondary | ICD-10-CM | POA: Diagnosis not present

## 2017-05-22 DIAGNOSIS — Z23 Encounter for immunization: Secondary | ICD-10-CM

## 2017-05-22 MED ORDER — CYANOCOBALAMIN 1000 MCG/ML IJ SOLN
1000.0000 ug | Freq: Once | INTRAMUSCULAR | Status: AC
  Administered 2017-05-22: 1000 ug via INTRAMUSCULAR

## 2017-06-05 ENCOUNTER — Other Ambulatory Visit: Payer: Self-pay | Admitting: Family Medicine

## 2017-06-05 NOTE — Telephone Encounter (Signed)
Px written for call in   

## 2017-06-05 NOTE — Telephone Encounter (Signed)
Last office visit 03/12/2017.  Last refilled 02/04/2017 for #30 with 3 refills.  Ok to refill?

## 2017-06-05 NOTE — Telephone Encounter (Signed)
Rx called to pharmacy as instructed. 

## 2017-06-25 ENCOUNTER — Ambulatory Visit (INDEPENDENT_AMBULATORY_CARE_PROVIDER_SITE_OTHER): Payer: Medicare Other

## 2017-06-25 DIAGNOSIS — E538 Deficiency of other specified B group vitamins: Secondary | ICD-10-CM | POA: Diagnosis not present

## 2017-06-25 MED ORDER — CYANOCOBALAMIN 1000 MCG/ML IJ SOLN
1000.0000 ug | Freq: Once | INTRAMUSCULAR | Status: AC
  Administered 2017-06-25: 1000 ug via INTRAMUSCULAR

## 2017-06-26 ENCOUNTER — Ambulatory Visit: Payer: Medicare Other

## 2017-07-02 ENCOUNTER — Encounter: Payer: Self-pay | Admitting: Family Medicine

## 2017-07-02 DIAGNOSIS — Z1231 Encounter for screening mammogram for malignant neoplasm of breast: Secondary | ICD-10-CM | POA: Diagnosis not present

## 2017-07-16 DIAGNOSIS — E559 Vitamin D deficiency, unspecified: Secondary | ICD-10-CM | POA: Diagnosis not present

## 2017-07-16 DIAGNOSIS — N183 Chronic kidney disease, stage 3 (moderate): Secondary | ICD-10-CM | POA: Diagnosis not present

## 2017-07-16 DIAGNOSIS — R600 Localized edema: Secondary | ICD-10-CM | POA: Diagnosis not present

## 2017-07-16 DIAGNOSIS — I10 Essential (primary) hypertension: Secondary | ICD-10-CM | POA: Diagnosis not present

## 2017-07-30 ENCOUNTER — Ambulatory Visit: Payer: Medicare Other

## 2017-07-31 ENCOUNTER — Other Ambulatory Visit: Payer: Self-pay | Admitting: Family Medicine

## 2017-08-06 ENCOUNTER — Ambulatory Visit (INDEPENDENT_AMBULATORY_CARE_PROVIDER_SITE_OTHER): Payer: Medicare Other

## 2017-08-06 DIAGNOSIS — E538 Deficiency of other specified B group vitamins: Secondary | ICD-10-CM | POA: Diagnosis not present

## 2017-08-06 MED ORDER — CYANOCOBALAMIN 1000 MCG/ML IJ SOLN
1000.0000 ug | Freq: Once | INTRAMUSCULAR | Status: AC
  Administered 2017-08-06: 1000 ug via INTRAMUSCULAR

## 2017-08-27 ENCOUNTER — Other Ambulatory Visit: Payer: Self-pay | Admitting: Family Medicine

## 2017-09-11 ENCOUNTER — Ambulatory Visit: Payer: Medicare Other

## 2017-09-13 ENCOUNTER — Encounter: Payer: Self-pay | Admitting: Family Medicine

## 2017-09-13 ENCOUNTER — Ambulatory Visit (INDEPENDENT_AMBULATORY_CARE_PROVIDER_SITE_OTHER): Payer: Medicare Other | Admitting: Family Medicine

## 2017-09-13 VITALS — BP 120/64 | HR 66 | Temp 97.8°F | Ht 61.5 in | Wt 188.0 lb

## 2017-09-13 DIAGNOSIS — I69359 Hemiplegia and hemiparesis following cerebral infarction affecting unspecified side: Secondary | ICD-10-CM

## 2017-09-13 DIAGNOSIS — E1165 Type 2 diabetes mellitus with hyperglycemia: Secondary | ICD-10-CM

## 2017-09-13 DIAGNOSIS — E538 Deficiency of other specified B group vitamins: Secondary | ICD-10-CM

## 2017-09-13 DIAGNOSIS — I10 Essential (primary) hypertension: Secondary | ICD-10-CM

## 2017-09-13 DIAGNOSIS — N289 Disorder of kidney and ureter, unspecified: Secondary | ICD-10-CM | POA: Diagnosis not present

## 2017-09-13 DIAGNOSIS — E7800 Pure hypercholesterolemia, unspecified: Secondary | ICD-10-CM

## 2017-09-13 DIAGNOSIS — E11319 Type 2 diabetes mellitus with unspecified diabetic retinopathy without macular edema: Secondary | ICD-10-CM | POA: Diagnosis not present

## 2017-09-13 LAB — LIPID PANEL
Cholesterol: 149 mg/dL (ref 0–200)
HDL: 37.3 mg/dL — ABNORMAL LOW (ref >39.00)
LDL Cholesterol: 79 mg/dL (ref 0–99)
NonHDL: 112.03
Total CHOL/HDL Ratio: 4
Triglycerides: 163 mg/dL — ABNORMAL HIGH (ref 0.0–149.0)
VLDL: 32.6 mg/dL (ref 0.0–40.0)

## 2017-09-13 LAB — COMPREHENSIVE METABOLIC PANEL
ALT: 11 U/L (ref 0–35)
AST: 12 U/L (ref 0–37)
Albumin: 3.5 g/dL (ref 3.5–5.2)
Alkaline Phosphatase: 78 U/L (ref 39–117)
BUN: 23 mg/dL (ref 6–23)
CO2: 29 mEq/L (ref 19–32)
Calcium: 8.6 mg/dL (ref 8.4–10.5)
Chloride: 108 mEq/L (ref 96–112)
Creatinine, Ser: 1.34 mg/dL — ABNORMAL HIGH (ref 0.40–1.20)
GFR: 39.81 mL/min — ABNORMAL LOW (ref >60.00)
Glucose, Bld: 227 mg/dL — ABNORMAL HIGH (ref 70–99)
Potassium: 4.4 mEq/L (ref 3.5–5.1)
Sodium: 143 mEq/L (ref 135–145)
Total Bilirubin: 0.4 mg/dL (ref 0.2–1.2)
Total Protein: 5.5 g/dL — ABNORMAL LOW (ref 6.0–8.3)

## 2017-09-13 LAB — HEMOGLOBIN A1C: Hgb A1c MFr Bld: 5.9 % (ref 4.6–6.5)

## 2017-09-13 MED ORDER — CYANOCOBALAMIN 1000 MCG/ML IJ SOLN
1000.0000 ug | Freq: Once | INTRAMUSCULAR | Status: AC
  Administered 2017-09-13: 1000 ug via INTRAMUSCULAR

## 2017-09-13 NOTE — Assessment & Plan Note (Signed)
On gemfibrozil for triglycerides  Hesitant to add statin due to the myopathy risk in pt post cva  Disc goals for lipids and reasons to control them Rev labs with pt (last check)- lab today Rev low sat fat diet in detail

## 2017-09-13 NOTE — Patient Instructions (Addendum)
Add an extra 20 minutes of inside walking per day (safely)    B12 shot today  Labs today   Take care of yourself   Follow up in 6 months for annual exam

## 2017-09-13 NOTE — Assessment & Plan Note (Signed)
bp in fair control at this time  BP Readings from Last 1 Encounters:  09/13/17 120/64   No changes needed Disc lifstyle change with low sodium diet and exercise  Labs today  Ace and beta blocker and hydralazine

## 2017-09-13 NOTE — Assessment & Plan Note (Signed)
A1c today  Has been stable on glipizide w/o hypoglycemia  Eye and foot care discussed  Enc to continue DM diet and get exercise indoors

## 2017-09-13 NOTE — Assessment & Plan Note (Signed)
B12 injection today  Clinically improved with it

## 2017-09-13 NOTE — Assessment & Plan Note (Signed)
Followed by nephrology Avoiding nsaids  Enc water intake Lab today

## 2017-09-13 NOTE — Assessment & Plan Note (Signed)
Continues good DM control and regular eye exams

## 2017-09-13 NOTE — Assessment & Plan Note (Signed)
No changes  Pt gets by with assistive devices at this time

## 2017-09-13 NOTE — Progress Notes (Signed)
Subjective:    Patient ID: Renee [name], female    DOB: September 22, 1928, 84 y.o.   MRN: [id number]  HPI Here for f/u of chronic health problems   Has not been able to walk outside  Is stuck inside  She can walk in the house   Wt Readings from Last 3 Encounters:  09/13/17 188 lb (85.3 kg)  03/12/17 181 lb 8 oz (82.3 kg)  12/24/16 179 lb 4 oz (81.3 kg)  less active  Diet is pretty good/ careful about what she eats  34.95 kg/m   bp is up on first check today  No cp or palpitations or headaches or edema  No side effects to medicines  BP Readings from Last 3 Encounters:  09/13/17 (!) 154/58  03/12/17 128/68  12/24/16 (!) 126/56    No missed doses Ace/ beta blocker and amlodipine   DM2 Lab Results  Component Value Date   HGBA1C 5.7 03/12/2017  with DM retinopathy utd with her eye exams  Feet are ok - good care   CKD Seen by nephrology Lab Results  Component Value Date   CREATININE 1.50 (H) 03/12/2017   BUN 25 (H) 03/12/2017   NA 141 03/12/2017   K 4.5 03/12/2017   CL 108 03/12/2017   CO2 27 03/12/2017  makes effort to drink water    B12 def-due for shot today  Lab Results  Component Value Date   VITAMINB12 >1500 (H) 03/12/2017   Hyperlipidemia/trig Lab Results  Component Value Date   CHOL 172 03/12/2017   HDL 37.00 (L) 03/12/2017   LDLCALC 124 (H) 02/18/2015   LDLDIRECT 98.0 03/12/2017   TRIG 266.0 (H) 03/12/2017   CHOLHDL 5 03/12/2017   On gemfibrozil Apprehensive to add statin due to the risk of myopathy  (in a pt with hx of cva already)  Patient Active Problem List   Diagnosis Date Noted  . Generalized weakness 12/24/2016  . Respiratory distress 12/11/2016  . Tachypnea 12/11/2016  . Diabetic retinopathy ([place]) 12/11/2016  . History of CVA (cerebrovascular accident) 12/11/2016  . Estrogen deficiency 08/30/2015  . Encounter for Medicare annual wellness exam 04/17/2013  . Mobility impaired 06/26/2011  . History of retinal detachment 01/10/2011    . Sleep apnea 11/28/2010  . Depression with anxiety 08/25/2010  . Hemiplegia, late effect of cerebrovascular disease ([place]) 07/05/2010  . POSTHERPETIC NEURALGIA 11/09/2009  . Osteopenia 12/20/2008  . Renal insufficiency 06/29/2008  . BACK PAIN 01/26/2008  . EDEMA 01/26/2008  . B12 deficiency 01/10/2007  . Diabetes type 2, controlled ([place]) 11/27/2006  . HLD (hyperlipidemia) 11/27/2006  . Essential hypertension 11/27/2006  . FIBROCYSTIC BREAST DISEASE 11/27/2006  . ROSACEA 11/27/2006  . OSTEOARTHRITIS 11/27/2006  . URINARY INCONTINENCE, MIXED 11/27/2006   Past Medical History:  Diagnosis Date  . Angioedema    possibly from voltaren  . Bronchopneumonia 12/11/2016  . Degenerative disc disease   . Diabetes mellitus    type II  . Hyperlipidemia   . Hypertension   . LVH (left ventricular hypertrophy)    and atrial enlargement by echo in past with nl EF  . Nasal pruritis   . Osteoarthritis   . Osteopenia   . Renal insufficiency   . Sleep apnea   . Stroke [facility]) 05/2010   Small vessel sobcortical (in [place] trial) with Dr Leonie Man, residual L hemiparesis  . Vitamin B 12 deficiency 04/08   Past Surgical History:  Procedure Laterality Date  . ABDOMINAL HYSTERECTOMY     BSO-fibroids  .  APPENDECTOMY    . BACK SURGERY    . COLON SURGERY     due to punctured intestines  . EYE SURGERY     cataract extraction  . KNEE SURGERY     arthroscope  . PARS PLANA VITRECTOMY  07/31/2011   Procedure: PARS PLANA VITRECTOMY WITH 25 GAUGE;  Surgeon: [name], MD;  Location: [place];  Service: Ophthalmology;  Laterality: Right;  REMOVAL OF SILICONE OIL AND LASER RIGHT EYE  . RETINAL DETACHMENT SURGERY  02/18/11   times 2  . SPINE SURGERY  08/09   spinal decompression surgery   Social History   Tobacco Use  . Smoking status: Never Smoker  . Smokeless tobacco: Never Used  Substance Use Topics  . Alcohol use: No    Alcohol/week: 0.0 oz  . Drug use: No   Family History  Problem  Relation Age of Onset  . COPD Brother   . Cancer Sister        brain tumor with hemmorhage  . Heart disease Sister        CAD   Allergies  Allergen Reactions  . Diclofenac Sodium Other (See Comments)    REACTION: angioedema  . Pioglitazone Swelling   Current Outpatient Medications on File Prior to Visit  Medication Sig Dispense Refill  . ACCU-CHEK AVIVA PLUS test strip USE TO CHECK BLOOD SUGAR ONCE DAILY AND AS DIRECTED. DX E11.9 100 each 1  . acetaminophen (TYLENOL) 500 MG tablet Take 1,000 mg by mouth at bedtime.     Marland Kitchen albuterol (PROVENTIL HFA;VENTOLIN HFA) 108 (90 Base) MCG/ACT inhaler Inhale 2 puffs into the lungs every 4 (four) hours as needed for wheezing. 1 Inhaler 3  . ALPRAZolam (XANAX) 0.5 MG tablet TAKE 1 TABLET BY MOUTH TWICE A DAY AS NEEDED ANXIETY 30 tablet 3  . amLODipine (NORVASC) 10 MG tablet TAKE 1 TABLET EVERY DAY 90 tablet 1  . aspirin 325 MG tablet Take 325 mg by mouth daily.     . beta carotene w/minerals (OCUVITE) tablet Take 1 tablet by mouth 2 (two) times daily.     . calcium-vitamin D (OSCAL WITH D) 500-200 MG-UNIT per tablet Take 1 tablet by mouth daily.     . Cholecalciferol (VITAMIN D) 1000 UNITS capsule Take 1,000 Units by mouth daily.     . cyanocobalamin (,VITAMIN B-12,) 1000 MCG/ML injection Inject 1,000 mcg into the muscle every 30 (thirty) days.     Marland Kitchen docusate sodium (COLACE) 100 MG capsule Take 200 mg by mouth at bedtime.    . ferrous sulfate 325 (65 FE) MG tablet Take 325 mg by mouth 2 (two) times daily.    Marland Kitchen gemfibrozil (LOPID) 600 MG tablet TAKE 1 TABLET EVERY DAY 90 tablet 1  . glipiZIDE (GLUCOTROL XL) 2.5 MG 24 hr tablet Take 1 tablet (2.5 mg total) by mouth daily with breakfast. 90 tablet 1  . guaiFENesin (ROBITUSSIN) 100 MG/5ML SOLN Take 5 mLs by mouth every 4 (four) hours as needed for cough or to loosen phlegm.    . hydrALAZINE (APRESOLINE) 100 MG tablet Take 100 mg by mouth 3 (three) times daily.     . Lancets (ACCU-CHEK MULTICLIX) lancets  Use to check blood sugar once daily and as directed. Dx E11.9 102 each 1  . metoprolol succinate (TOPROL-XL) 25 MG 24 hr tablet Take 0.5 tablets (12.5 mg total) by mouth 2 (two) times daily. 90 tablet 3  . mirtazapine (REMERON) 15 MG tablet Take 0.5 tablets (7.5 mg total)  by mouth at bedtime. 45 tablet 3  . Multiple Vitamin (MULTIVITAMIN) capsule Take 1 capsule by mouth daily.     . Polyvinyl Alcohol-Povidone (REFRESH OP) Place 1 drop into both eyes daily as needed (dry eyes).    . sertraline (ZOLOFT) 50 MG tablet Take 1 tablet (50 mg total) by mouth daily. (Patient taking differently: Take 50 mg by mouth every morning. ) 90 tablet 3  . trandolapril (MAVIK) 4 MG tablet TAKE 1 TABLET EVERY DAY 90 tablet 1  . vitamin B-12 (CYANOCOBALAMIN) 100 MCG tablet Take 100 mcg by mouth daily.     No current facility-administered medications on file prior to visit.      Review of Systems  Constitutional: Negative for activity change, appetite change, fatigue, fever and unexpected weight change.  HENT: Negative for congestion, ear pain, rhinorrhea, sinus pressure and sore throat.   Eyes: Negative for pain, redness and visual disturbance.  Respiratory: Negative for cough, shortness of breath and wheezing.   Cardiovascular: Negative for chest pain and palpitations.  Gastrointestinal: Negative for abdominal pain, blood in stool, constipation and diarrhea.  Endocrine: Negative for polydipsia and polyuria.  Genitourinary: Negative for dysuria, frequency and urgency.  Musculoskeletal: Negative for arthralgias, back pain and myalgias.  Skin: Negative for pallor and rash.  Allergic/Immunologic: Negative for environmental allergies.  Neurological: Positive for weakness. Negative for dizziness, syncope and headaches.       Baseline hemiplegia   Hematological: Negative for adenopathy. Does not bruise/bleed easily.  Psychiatric/Behavioral: Negative for decreased concentration and dysphoric mood. The patient is not  nervous/anxious.        Objective:   Physical Exam  Constitutional: She appears well-developed and well-nourished. No distress.  Frail appearing elderly female  HENT:  Head: Normocephalic and atraumatic.  Mouth/Throat: Oropharynx is clear and moist.  TMs clear No cerumen  Eyes: Conjunctivae and EOM are normal. Pupils are equal, round, and reactive to light. No scleral icterus.  Neck: Normal range of motion. Neck supple. No JVD present. Carotid bruit is not present. No thyromegaly present.  Cardiovascular: Normal rate, regular rhythm, normal heart sounds and intact distal pulses. Exam reveals no gallop.  Pulmonary/Chest: Effort normal and breath sounds normal. No respiratory distress. She has no wheezes. She has no rales.  No crackles  Abdominal: Soft. Bowel sounds are normal. She exhibits no distension, no abdominal bruit and no mass. There is no tenderness.  Musculoskeletal: She exhibits edema.  Trace ankle edema today  Lymphadenopathy:    She has no cervical adenopathy.  Neurological: She is alert. She has normal reflexes. No cranial nerve deficit. She exhibits normal muscle tone.  Baseline hemiplegia   Skin: Skin is warm and dry. No rash noted. No pallor.  Psychiatric: She has a normal mood and affect.  Nl affect today          Assessment & Plan:   Problem List Items Addressed This Visit      Cardiovascular and Mediastinum   Essential hypertension    bp in fair control at this time  BP Readings from Last 1 Encounters:  09/13/17 120/64   No changes needed Disc lifstyle change with low sodium diet and exercise  Labs today  Ace and beta blocker and hydralazine        Endocrine   Diabetes type 2, controlled ([place]) - Primary    A1c today  Has been stable on glipizide w/o hypoglycemia  Eye and foot care discussed  Enc to continue DM diet and get exercise  indoors       Relevant Orders   Comprehensive metabolic panel   Hemoglobin A1c   Diabetic retinopathy  ([place])    Continues good DM control and regular eye exams        Nervous and Auditory   Hemiplegia, late effect of cerebrovascular disease ([place])    No changes  Pt gets by with assistive devices at this time         Genitourinary   Renal insufficiency    Followed by nephrology Avoiding nsaids  Enc water intake Lab today      Relevant Orders   Comprehensive metabolic panel     Other   B12 deficiency    B12 injection today  Clinically improved with it       Relevant Medications   cyanocobalamin ((VITAMIN B-12)) injection 1,000 mcg (Completed)   HLD (hyperlipidemia)    On gemfibrozil for triglycerides  Hesitant to add statin due to the myopathy risk in pt post cva  Disc goals for lipids and reasons to control them Rev labs with pt (last check)- lab today Rev low sat fat diet in detail       Relevant Orders   Lipid panel

## 2017-09-16 ENCOUNTER — Encounter: Payer: Self-pay | Admitting: *Deleted

## 2017-09-27 ENCOUNTER — Other Ambulatory Visit: Payer: Self-pay | Admitting: Family Medicine

## 2017-09-30 ENCOUNTER — Other Ambulatory Visit: Payer: Self-pay | Admitting: Family Medicine

## 2017-10-01 ENCOUNTER — Emergency Department (HOSPITAL_BASED_OUTPATIENT_CLINIC_OR_DEPARTMENT_OTHER)
Admit: 2017-10-01 | Discharge: 2017-10-01 | Disposition: A | Discharge status: Home / Self Care | Payer: Medicare Other | Attending: Emergency Medicine | Admitting: Emergency Medicine

## 2017-10-01 ENCOUNTER — Emergency Department (HOSPITAL_COMMUNITY)
Admission: EM | Admit: 2017-10-01 | Discharge: 2017-10-01 | Disposition: A | Discharge status: Home / Self Care | Payer: Medicare Other | Attending: Emergency Medicine | Admitting: Emergency Medicine

## 2017-10-01 ENCOUNTER — Other Ambulatory Visit: Payer: Self-pay

## 2017-10-01 ENCOUNTER — Ambulatory Visit: Payer: Self-pay | Admitting: *Deleted

## 2017-10-01 ENCOUNTER — Encounter (HOSPITAL_COMMUNITY): Payer: Self-pay | Admitting: Emergency Medicine

## 2017-10-01 DIAGNOSIS — Z7984 Long term (current) use of oral hypoglycemic drugs: Secondary | ICD-10-CM | POA: Diagnosis not present

## 2017-10-01 DIAGNOSIS — E119 Type 2 diabetes mellitus without complications: Secondary | ICD-10-CM | POA: Insufficient documentation

## 2017-10-01 DIAGNOSIS — M79609 Pain in unspecified limb: Secondary | ICD-10-CM | POA: Diagnosis not present

## 2017-10-01 DIAGNOSIS — M79604 Pain in right leg: Secondary | ICD-10-CM

## 2017-10-01 DIAGNOSIS — Z7982 Long term (current) use of aspirin: Secondary | ICD-10-CM | POA: Insufficient documentation

## 2017-10-01 DIAGNOSIS — Z79899 Other long term (current) drug therapy: Secondary | ICD-10-CM | POA: Diagnosis not present

## 2017-10-01 DIAGNOSIS — M79661 Pain in right lower leg: Secondary | ICD-10-CM | POA: Diagnosis not present

## 2017-10-01 DIAGNOSIS — I10 Essential (primary) hypertension: Secondary | ICD-10-CM | POA: Insufficient documentation

## 2017-10-01 DIAGNOSIS — R52 Pain, unspecified: Secondary | ICD-10-CM | POA: Diagnosis not present

## 2017-10-01 LAB — CBC WITH DIFFERENTIAL/PLATELET
Basophils Absolute: 0 10*3/uL (ref 0.0–0.1)
Basophils Relative: 1 %
Eosinophils Absolute: 0.2 10*3/uL (ref 0.0–0.7)
Eosinophils Relative: 4 %
HCT: 35.2 % — ABNORMAL LOW (ref 36.0–46.0)
Hemoglobin: 11 g/dL — ABNORMAL LOW (ref 12.0–15.0)
Lymphocytes Relative: 16 %
Lymphs Abs: 1 10*3/uL (ref 0.7–4.0)
MCH: 28.4 pg (ref 26.0–34.0)
MCHC: 31.3 g/dL (ref 30.0–36.0)
MCV: 91 fL (ref 78.0–100.0)
Monocytes Absolute: 0.4 10*3/uL (ref 0.1–1.0)
Monocytes Relative: 6 %
Neutro Abs: 4.8 10*3/uL (ref 1.7–7.7)
Neutrophils Relative %: 73 %
Platelets: 161 10*3/uL (ref 150–400)
RBC: 3.87 MIL/uL (ref 3.87–5.11)
RDW: 14 % (ref 11.5–15.5)
WBC: 6.4 10*3/uL (ref 4.0–10.5)

## 2017-10-01 LAB — COMPREHENSIVE METABOLIC PANEL
ALT: 13 U/L — ABNORMAL LOW (ref 14–54)
AST: 15 U/L (ref 15–41)
Albumin: 3.1 g/dL — ABNORMAL LOW (ref 3.5–5.0)
Alkaline Phosphatase: 68 U/L (ref 38–126)
Anion gap: 9 (ref 5–15)
BUN: 23 mg/dL — ABNORMAL HIGH (ref 6–20)
CO2: 25 mmol/L (ref 22–32)
Calcium: 8.8 mg/dL — ABNORMAL LOW (ref 8.9–10.3)
Chloride: 108 mmol/L (ref 101–111)
Creatinine, Ser: 1.3 mg/dL — ABNORMAL HIGH (ref 0.44–1.00)
GFR calc Af Amer: 42 mL/min — ABNORMAL LOW (ref >60)
GFR calc non Af Amer: 36 mL/min — ABNORMAL LOW (ref >60)
Glucose, Bld: 149 mg/dL — ABNORMAL HIGH (ref 65–99)
Potassium: 4 mmol/L (ref 3.5–5.1)
Sodium: 142 mmol/L (ref 135–145)
Total Bilirubin: 0.4 mg/dL (ref 0.3–1.2)
Total Protein: 5.1 g/dL — ABNORMAL LOW (ref 6.5–8.1)

## 2017-10-01 LAB — MAGNESIUM: Magnesium: 1.8 mg/dL (ref 1.7–2.4)

## 2017-10-01 NOTE — ED Notes (Signed)
Patient transported to vascular. 

## 2017-10-01 NOTE — Discharge Instructions (Signed)
There was no sign of blood clot on the ultrasound today.  Lab results were also reassuring.  May take Tylenol for pain.  Also elevate the extremities.  Follow-up with your primary care provider on this matter.  Return to the ED for worsening symptoms.

## 2017-10-01 NOTE — Progress Notes (Addendum)
Bilateral lower extremity venous duplex has been completed. Negative for DVT.  Results were given to Arlean Hopping PA.   10/01/17 11:57 AM Carlos Levering RVT

## 2017-10-01 NOTE — Telephone Encounter (Signed)
Pt reports pain, swelling right calf  "right below back of knee, down calf."  Pain 8/10, unable to ambulate. Onset Sunday, worsening pain.  Pain is present at rest as well. Agency aide is present during call; states "mild" swelling, redness, no warmth. Pt denies SOB, chest pain. No other symptoms. H/O CVA 2014. Pt sent to ED. Tech from [facility]' present during call; pt lives alone, agency will call 911 for transport and stay with patient until arrival.   Reason for Disposition . [1] Thigh or calf pain AND [2] only 1 side AND [3] present > 1 hour  Answer Assessment - Initial Assessment Questions 1. ONSET: "When did the pain start?"      Sunday 09/29/17 2. LOCATION: "Where is the pain located?"     Right calf, below knee 3. PAIN: "How bad is the pain?"    (Scale 1-10; or mild, moderate, severe)   -  MILD (1-3): doesn't interfere with normal activities    -  MODERATE (4-7): interferes with normal activities (e.g., work or school) or awakens from sleep, limping    -  SEVERE (8-10): excruciating pain, unable to do any normal activities, unable to walk     9/10 4. WORK OR EXERCISE: "Has there been any recent work or exercise that involved this part of the body?"      No 5. CAUSE: "What do you think is causing the leg pain?"    Unsure 6. OTHER SYMPTOMS: "Do you have any other symptoms?" (e.g., chest pain, back pain, breathing difficulty, swelling, rash, fever, numbness, weakness)     Swelling behind knee and calf  Protocols used: LEG PAIN-A-AH

## 2017-10-01 NOTE — ED Triage Notes (Signed)
Arrived via EMS from home with caretaker. Patient developed right lower extremity 2 days ago. Denies falling or trauma.  Pain worsening overtime currently while on stretcher 0/10 when attempting to stand pain 8-10/10 achy sharp.

## 2017-10-01 NOTE — ED Provider Notes (Signed)
[facility] EMERGENCY DEPARTMENT Provider Note   CSN: [id number] Arrival date & time: 10/01/17  1003     History   Chief Complaint Chief Complaint  Patient presents with  . Leg Pain    HPI Renee [name] is a 84 y.o. female.  HPI   Renee [name] is a 84 y.o. female, with a history of DM, HTN, hyperlipidemia, and stroke, presenting to the ED with right leg pain beginning two days ago, noticed upon standing. Pain is aching, 8/10, worse with walking, located in right calf, nonradiating.  Denies fever/chills, N/V, numbness, weakness, falls/trauma, SOB, CP, lower extremity edema, or any other complaints.  No history of PE/DVT, recent surgery, recent trauma, immobilization.    Past Medical History:  Diagnosis Date  . Angioedema    possibly from voltaren  . Bronchopneumonia 12/11/2016  . Degenerative disc disease   . Diabetes mellitus    type II  . Hyperlipidemia   . Hypertension   . LVH (left ventricular hypertrophy)    and atrial enlargement by echo in past with nl EF  . Nasal pruritis   . Osteoarthritis   . Osteopenia   . Renal insufficiency   . Sleep apnea   . Stroke [facility]) 05/2010   Small vessel sobcortical (in [place] trial) with Dr Leonie Man, residual L hemiparesis  . Vitamin B 12 deficiency 04/08    Patient Active Problem List   Diagnosis Date Noted  . Generalized weakness 12/24/2016  . Respiratory distress 12/11/2016  . Tachypnea 12/11/2016  . Diabetic retinopathy ([place]) 12/11/2016  . History of CVA (cerebrovascular accident) 12/11/2016  . Estrogen deficiency 08/30/2015  . Encounter for Medicare annual wellness exam 04/17/2013  . Mobility impaired 06/26/2011  . History of retinal detachment 01/10/2011  . Sleep apnea 11/28/2010  . Depression with anxiety 08/25/2010  . Hemiplegia, late effect of cerebrovascular disease ([place]) 07/05/2010  . POSTHERPETIC NEURALGIA 11/09/2009  . Osteopenia 12/20/2008  . Renal insufficiency 06/29/2008  . BACK  PAIN 01/26/2008  . EDEMA 01/26/2008  . B12 deficiency 01/10/2007  . Diabetes type 2, controlled ([place]) 11/27/2006  . HLD (hyperlipidemia) 11/27/2006  . Essential hypertension 11/27/2006  . FIBROCYSTIC BREAST DISEASE 11/27/2006  . ROSACEA 11/27/2006  . OSTEOARTHRITIS 11/27/2006  . URINARY INCONTINENCE, MIXED 11/27/2006    Past Surgical History:  Procedure Laterality Date  . ABDOMINAL HYSTERECTOMY     BSO-fibroids  . APPENDECTOMY    . BACK SURGERY    . COLON SURGERY     due to punctured intestines  . EYE SURGERY     cataract extraction  . KNEE SURGERY     arthroscope  . PARS PLANA VITRECTOMY  07/31/2011   Procedure: PARS PLANA VITRECTOMY WITH 25 GAUGE;  Surgeon: [name], MD;  Location: [facility];  Service: Ophthalmology;  Laterality: Right;  REMOVAL OF SILICONE OIL AND LASER RIGHT EYE  . RETINAL DETACHMENT SURGERY  02/18/11   times 2  . SPINE SURGERY  08/09   spinal decompression surgery    OB History    No data available       Home Medications    Prior to Admission medications   Medication Sig Start Date End Date Taking? Authorizing Provider  ACCU-CHEK AVIVA PLUS test strip USE TO CHECK BLOOD SUGAR ONCE DAILY AND AS DIRECTED. DX E11.9 08/27/17  Yes Tower, Wynelle Fanny, MD  acetaminophen (TYLENOL) 500 MG tablet Take 1,000 mg by mouth at bedtime.    Yes [provider]  albuterol (PROVENTIL  HFA;VENTOLIN HFA) 108 (90 Base) MCG/ACT inhaler Inhale 2 puffs into the lungs every 4 (four) hours as needed for wheezing. 12/07/16  Yes Tower, Wynelle Fanny, MD  ALPRAZolam [name]) 0.5 MG tablet TAKE 1 TABLET BY MOUTH TWICE A DAY AS NEEDED ANXIETY 06/05/17  Yes Tower, Wynelle Fanny, MD  aspirin 325 MG tablet Take 325 mg by mouth daily.    Yes [provider]  beta carotene w/minerals (OCUVITE) tablet Take 1 tablet by mouth 2 (two) times daily.    Yes [provider]  calcium-vitamin D (OSCAL WITH D) 500-200 MG-UNIT per tablet Take 1 tablet by mouth daily.    Yes [provider]  Cholecalciferol (VITAMIN D) 1000 UNITS capsule Take 1,000 Units by mouth daily.    Yes [provider]  cyanocobalamin (,VITAMIN B-12,) 1000 MCG/ML injection Inject 1,000 mcg into the muscle every 30 (thirty) days.    Yes [provider]  docusate sodium (COLACE) 100 MG capsule Take 200 mg by mouth at bedtime.   Yes [provider]  ferrous sulfate 325 (65 FE) MG tablet Take 325 mg by mouth 2 (two) times daily.   Yes [provider]  gemfibrozil (LOPID) 600 MG tablet TAKE 1 TABLET EVERY DAY 03/15/17  Yes Tower, Wynelle Fanny, MD  glipiZIDE (GLUCOTROL XL) 2.5 MG 24 hr tablet Take 1 tablet (2.5 mg total) by mouth daily with breakfast. 04/05/17  Yes Tower, Wynelle Fanny, MD  guaiFENesin (ROBITUSSIN) 100 MG/5ML SOLN Take 5 mLs by mouth every 4 (four) hours as needed for cough or to loosen phlegm.   Yes [provider]  hydrALAZINE (APRESOLINE) 100 MG tablet Take 100 mg by mouth 3 (three) times daily.  08/30/14  Yes [provider]  Lancets (ACCU-CHEK MULTICLIX) lancets Use to check blood sugar once daily and as directed. Dx E11.9 03/06/17  Yes Tower, Wynelle Fanny, MD  metoprolol succinate (TOPROL-XL) 25 MG 24 hr tablet TAKE 1/2 TABLET TWICE DAILY 10/01/17  Yes Tower, Wynelle Fanny, MD  mirtazapine (REMERON) 15 MG tablet TAKE 1/2 TABLET AT BEDTIME 09/27/17  Yes Tower, Wynelle Fanny, MD  Multiple Vitamin (MULTIVITAMIN) capsule Take 1 capsule by mouth daily.    Yes [provider]  Polyvinyl Alcohol-Povidone (REFRESH OP) Place 1 drop into both eyes daily as needed (dry eyes).   Yes [provider]  sertraline (ZOLOFT) 50 MG tablet Take 1 tablet (50 mg total) by mouth daily. Patient taking differently: Take 50 mg by mouth every morning.  09/12/16  Yes Tower, Wynelle Fanny, MD  vitamin B-12 (CYANOCOBALAMIN) 100 MCG tablet Take 100 mcg by mouth daily.   Yes [provider]  amLODipine (NORVASC) 10 MG tablet TAKE 1 TABLET EVERY DAY 10/02/17   Tower, Wynelle Fanny,  MD  trandolapril (Leonore) 4 MG tablet TAKE 1 TABLET EVERY DAY 10/02/17   Tower, Wynelle Fanny, MD    Family History Family History  Problem Relation Age of Onset  . COPD Brother   . Cancer Sister        brain tumor with hemmorhage  . Heart disease Sister        CAD    Social History Social History   Tobacco Use  . Smoking status: Never Smoker  . Smokeless tobacco: Never Used  Substance Use Topics  . Alcohol use: No    Alcohol/week: 0.0 oz  . Drug use: No     Allergies   Diclofenac sodium and Pioglitazone   Review of Systems Review of Systems  Constitutional: Negative for chills, diaphoresis and fever.  Respiratory: Negative for cough, chest tightness and shortness of breath.   Cardiovascular: Negative for chest pain and leg swelling.  Gastrointestinal: Negative for abdominal pain, diarrhea, nausea and vomiting.  Musculoskeletal: Positive for myalgias. Negative for joint swelling.  Neurological: Negative for weakness and numbness.  All other systems reviewed and are negative.    Physical Exam Updated Vital Signs BP (!) 179/68   Pulse 60   Temp 97.9 F (36.6 C) (Oral)   Resp 16   Ht 5\' 2"  (1.575 m)   Wt 85.3 kg (188 lb)   SpO2 97%   BMI 34.39 kg/m   Physical Exam  Constitutional: She appears well-developed and well-nourished. No distress.  HENT:  Head: Normocephalic and atraumatic.  Eyes: Conjunctivae are normal.  Neck: Neck supple.  Cardiovascular: Normal rate, regular rhythm, normal heart sounds and intact distal pulses.  Pulses:      Dorsalis pedis pulses are 2+ on the right side, and 2+ on the left side.       Posterior tibial pulses are 2+ on the right side, and 2+ on the left side.  Pulmonary/Chest: Effort normal and breath sounds normal. No respiratory distress.  Abdominal: Soft. There is no tenderness. There is no guarding.  Musculoskeletal: She exhibits tenderness. She exhibits no edema.  Tenderness to bilateral calves.  No bony tenderness.  No  tenderness to the knees or ankles.  Full ROM in bilateral knees and ankles without pain.  Appropriately warm to the touch.  No noted erythema, edema, or increased warmth.  Lymphadenopathy:    She has no cervical adenopathy.  Neurological: She is alert.  No noted acute sensory deficits. Strength 5/5 with flexion and extension at the bilateral hips, knees, and ankles. Patient able to ambulate at her baseline with her walker.  Skin: Skin is warm and dry. Capillary refill takes less than 2 seconds. She is not diaphoretic.  Psychiatric: She has a normal mood and affect. Her behavior is normal.  Nursing note and vitals reviewed.    ED Treatments / Results  Labs (all labs ordered are listed, but only abnormal results are displayed) Labs Reviewed  COMPREHENSIVE METABOLIC PANEL - Abnormal; Notable for the following components:      Result Value   Glucose, Bld 149 (*)    BUN 23 (*)    Creatinine, Ser 1.30 (*)    Calcium 8.8 (*)    Total Protein 5.1 (*)    Albumin 3.1 (*)    ALT 13 (*)    GFR calc non Af Amer 36 (*)    GFR calc Af Amer 42 (*)    All other components within normal limits  CBC WITH DIFFERENTIAL/PLATELET - Abnormal; Notable for the following components:   Hemoglobin 11.0 (*)    HCT 35.2 (*)    All other components within normal limits  MAGNESIUM    EKG  EKG Interpretation None       Radiology Vas [place] Lower Extremity Venous (dvt) (only [facility])  Result Date: 10/01/2017  Lower Venous Study Indication: Pain. Examination Guidelines: A complete evaluation includes B-mode imaging, spectral doppler, color doppler, and power doppler as needed of all accessible portions of each vessel. Bilateral testing is considered an integral part of a complete examination. Limited examinations for reoccurring indications may be performed as noted. The reflux portion of the exam is performed with the patient in reverse Trendelenburg.  Right Venous Findings:  +---------+---------------+---------+-----------+----------+-------+  CompressibilityPhasicitySpontaneityPropertiesSummary +---------+---------------+---------+-----------+----------+-------+ CFV      Full           Yes      Yes                          +---------+---------------+---------+-----------+----------+-------+ FV Prox  Full                                                 +---------+---------------+---------+-----------+----------+-------+ FV Mid   Full                                                 +---------+---------------+---------+-----------+----------+-------+ FV DistalFull                                                 +---------+---------------+---------+-----------+----------+-------+ PFV      Full                                                 +---------+---------------+---------+-----------+----------+-------+ POP      Full           Yes      Yes                          +---------+---------------+---------+-----------+----------+-------+ PTV      Full                                                 +---------+---------------+---------+-----------+----------+-------+ PERO     Full                                                 +---------+---------------+---------+-----------+----------+-------+  Left Venous Findings: +---------+---------------+---------+-----------+----------+-------+          CompressibilityPhasicitySpontaneityPropertiesSummary +---------+---------------+---------+-----------+----------+-------+ CFV      Full           Yes      Yes                          +---------+---------------+---------+-----------+----------+-------+ FV Prox  Full                                                 +---------+---------------+---------+-----------+----------+-------+ FV Mid   Full                                                  +---------+---------------+---------+-----------+----------+-------+  FV DistalFull                                                 +---------+---------------+---------+-----------+----------+-------+ PFV      Full                                                 +---------+---------------+---------+-----------+----------+-------+ POP      Full           Yes      Yes                          +---------+---------------+---------+-----------+----------+-------+ PTV      Full                                                 +---------+---------------+---------+-----------+----------+-------+ PERO     Full                                                 +---------+---------------+---------+-----------+----------+-------+    Final Interpretation: Right: There is no evidence of deep vein thrombosis in the lower extremity.There is no evidence of superficial venous thrombosis. No cystic structure found in the popliteal fossa. Left: There is no evidence of deep vein thrombosis in the lower extremity.There is no evidence of superficial venous thrombosis. No cystic structure found in the popliteal fossa.  *See table(s) above for measurements and observations. Electronically signed by Harold Barban on 10/01/2017 at 12:06:15 PM.  ** Final **   Procedures Procedures (including critical care time)  Medications Ordered in ED Medications - No data to display   Initial Impression / Assessment and Plan / ED Course  I have reviewed the triage vital signs and the nursing notes.  Pertinent labs & imaging results that were available during my care of the patient were reviewed by me and considered in my medical decision making (see chart for details).  Clinical Course as of Oct 03 552  Tue Oct 01, 2017  1107 Declines pain management at this time.   [SJ]  1610 Consistent with previous values. Creatinine: (!) 1.30 [SJ]  1249 Consistent with previous values. BUN: (!) 23 [SJ]  1300 Discussed  imaging and lab results with the patient.  Patient complains of no current pain or discomfort.  [SJ]    Clinical Course User Index [SJ] Farhana Fellows C, PA-C   Patient presents with primarily right calf pain.  Tenderness in the bilateral callus, but no other abnormalities noted on exam.  No evidence of DVT on duplex ultrasound.  Patient pain-free and ambulating at her baseline at discharge.  PCP follow-up. The patient was given instructions for home care as well as return precautions. Patient voices understanding of these instructions, accepts the plan, and is comfortable with discharge.  Findings and plan of care discussed with Addison Lank, MD.   Vitals:   10/01/17 1215 10/01/17 1230 10/01/17 1245 10/01/17 1300  BP: (!) 181/69 Marland Kitchen)  172/63 (!) 191/70 (!) 174/69  Pulse: 62 60 62 69  Resp: (!) 26 19 17  (!) 23  Temp:      TempSrc:      SpO2: 95% 97% 100% 95%  Weight:      Height:         Final Clinical Impressions(s) / ED Diagnoses   Final diagnoses:  Right leg pain    ED Discharge Orders    None       Layla Maw 10/03/17 0556    Fatima Blank, MD 10/05/17 [phone]

## 2017-10-01 NOTE — Telephone Encounter (Signed)
Per chart review tab pt is at [facility] now. 

## 2017-10-01 NOTE — Telephone Encounter (Signed)
Will watch for notes,thanks

## 2017-10-02 ENCOUNTER — Other Ambulatory Visit: Payer: Self-pay | Admitting: Family Medicine

## 2017-10-05 ENCOUNTER — Other Ambulatory Visit: Payer: Self-pay | Admitting: Family Medicine

## 2017-10-06 ENCOUNTER — Other Ambulatory Visit: Payer: Self-pay | Admitting: Family Medicine

## 2017-10-07 ENCOUNTER — Encounter: Payer: Self-pay | Admitting: Family Medicine

## 2017-10-07 ENCOUNTER — Ambulatory Visit (INDEPENDENT_AMBULATORY_CARE_PROVIDER_SITE_OTHER): Payer: Medicare Other | Admitting: Family Medicine

## 2017-10-07 VITALS — BP 133/60 | HR 66 | Temp 97.8°F | Ht 61.5 in | Wt 184.8 lb

## 2017-10-07 DIAGNOSIS — E538 Deficiency of other specified B group vitamins: Secondary | ICD-10-CM | POA: Diagnosis not present

## 2017-10-07 DIAGNOSIS — I10 Essential (primary) hypertension: Secondary | ICD-10-CM | POA: Diagnosis not present

## 2017-10-07 DIAGNOSIS — M79661 Pain in right lower leg: Secondary | ICD-10-CM

## 2017-10-07 MED ORDER — CYANOCOBALAMIN 1000 MCG/ML IJ SOLN
1000.0000 ug | Freq: Once | INTRAMUSCULAR | Status: AC
  Administered 2017-10-07: 1000 ug via INTRAMUSCULAR

## 2017-10-07 NOTE — Telephone Encounter (Signed)
Hospital f/u appt today, last filled on 06/05/17 #30 tabs with 3 additional refills

## 2017-10-07 NOTE — Patient Instructions (Signed)
B12 shot today   The information from the hospital is reassuring  If your leg starts hurting again please let me know (or if swelling or other symptoms) Keep wearing your support hose

## 2017-10-07 NOTE — Assessment & Plan Note (Signed)
bp in fair control at this time  BP Readings from Last 1 Encounters:  10/07/17 133/60   No changes needed Disc lifstyle change with low sodium diet and exercise  Labs reviewed  Better on 2nd check and better than ED reading

## 2017-10-07 NOTE — Assessment & Plan Note (Signed)
Will get her inj early today since she is here

## 2017-10-07 NOTE — Telephone Encounter (Signed)
Will refill electronically  

## 2017-10-07 NOTE — Assessment & Plan Note (Signed)
Now much improved with neg ED w/u for DVT or other vascular cause  Reviewed hospital records, lab results and studies in detail  Mild tenderness with nl exam today  Continues supp hose No s/s of claudication and pedal pulses are palpable Disc what to watch for  Stretching recommended  Heat/if needed and continued supp hose Update if worse or no further improvement

## 2017-10-07 NOTE — Progress Notes (Signed)
Subjective:    Patient ID: Renee [name], female    DOB: Jun 20, 1929, 84 y.o.   MRN: [id number]  HPI  Here for f/u of ED visit on 10/01/17 for R leg pain (not a DVT) Primarily R calf pain - but nl exam and nl duplex venous  Was doing better by end of the visit as well  Labs and imaging as follows  Thinks she may have (in retrospect) twisted it when she got up from the commode  Back was not hurting   Feeling much better now  Still a little tender  Does get occ cramps  Toes were not drawn up  Uses a walker-walking like she was before   Otherwise feeling pretty good        Chemistry      Component Value Date/Time   NA 142 10/01/2017 1104   K 4.0 10/01/2017 1104   CL 108 10/01/2017 1104   CO2 25 10/01/2017 1104   BUN 23 (H) 10/01/2017 1104   CREATININE 1.30 (H) 10/01/2017 1104      Component Value Date/Time   CALCIUM 8.8 (L) 10/01/2017 1104   ALKPHOS 68 10/01/2017 1104   AST 15 10/01/2017 1104   ALT 13 (L) 10/01/2017 1104   BILITOT 0.4 10/01/2017 1104     Lab Results  Component Value Date   WBC 6.4 10/01/2017   HGB 11.0 (L) 10/01/2017   HCT 35.2 (L) 10/01/2017   MCV 91.0 10/01/2017   PLT 161 10/01/2017    Wt Readings from Last 3 Encounters:  10/07/17 184 lb 12 oz (83.8 kg)  10/01/17 188 lb (85.3 kg)  09/13/17 188 lb (85.3 kg)   BP Readings from Last 3 Encounters:  10/07/17 (!) 150/64  10/01/17 (!) 174/69  09/13/17 120/64   Pulse Readings from Last 3 Encounters:  10/07/17 66  10/01/17 69  09/13/17 66    Patient Active Problem List   Diagnosis Date Noted  . Right calf pain 10/07/2017  . Generalized weakness 12/24/2016  . Respiratory distress 12/11/2016  . Tachypnea 12/11/2016  . Diabetic retinopathy ([place]) 12/11/2016  . History of CVA (cerebrovascular accident) 12/11/2016  . Estrogen deficiency 08/30/2015  . Encounter for Medicare annual wellness exam 04/17/2013  . Mobility impaired 06/26/2011  . History of retinal detachment 01/10/2011  . Sleep  apnea 11/28/2010  . Depression with anxiety 08/25/2010  . Hemiplegia, late effect of cerebrovascular disease ([place]) 07/05/2010  . POSTHERPETIC NEURALGIA 11/09/2009  . Osteopenia 12/20/2008  . Renal insufficiency 06/29/2008  . BACK PAIN 01/26/2008  . EDEMA 01/26/2008  . B12 deficiency 01/10/2007  . Diabetes type 2, controlled ([place]) 11/27/2006  . HLD (hyperlipidemia) 11/27/2006  . Essential hypertension 11/27/2006  . FIBROCYSTIC BREAST DISEASE 11/27/2006  . ROSACEA 11/27/2006  . OSTEOARTHRITIS 11/27/2006  . URINARY INCONTINENCE, MIXED 11/27/2006   Past Medical History:  Diagnosis Date  . Angioedema    possibly from voltaren  . Bronchopneumonia 12/11/2016  . Degenerative disc disease   . Diabetes mellitus    type II  . Hyperlipidemia   . Hypertension   . LVH (left ventricular hypertrophy)    and atrial enlargement by echo in past with nl EF  . Nasal pruritis   . Osteoarthritis   . Osteopenia   . Renal insufficiency   . Sleep apnea   . Stroke [facility]) 05/2010   Small vessel sobcortical (in [place] trial) with Dr Leonie Man, residual L hemiparesis  . Vitamin B 12 deficiency 04/08   Past Surgical History:  Procedure Laterality Date  . ABDOMINAL HYSTERECTOMY     BSO-fibroids  . APPENDECTOMY    . BACK SURGERY    . COLON SURGERY     due to punctured intestines  . EYE SURGERY     cataract extraction  . KNEE SURGERY     arthroscope  . PARS PLANA VITRECTOMY  07/31/2011   Procedure: PARS PLANA VITRECTOMY WITH 25 GAUGE;  Surgeon: [name], MD;  Location: [place];  Service: Ophthalmology;  Laterality: Right;  REMOVAL OF SILICONE OIL AND LASER RIGHT EYE  . RETINAL DETACHMENT SURGERY  02/18/11   times 2  . SPINE SURGERY  08/09   spinal decompression surgery   Social History   Tobacco Use  . Smoking status: Never Smoker  . Smokeless tobacco: Never Used  Substance Use Topics  . Alcohol use: No    Alcohol/week: 0.0 oz  . Drug use: No   Family History  Problem Relation Age of  Onset  . COPD Brother   . Cancer Sister        brain tumor with hemmorhage  . Heart disease Sister        CAD   Allergies  Allergen Reactions  . Diclofenac Sodium Other (See Comments)    REACTION: angioedema  . Pioglitazone Swelling   Current Outpatient Medications on File Prior to Visit  Medication Sig Dispense Refill  . ACCU-CHEK AVIVA PLUS test strip USE TO CHECK BLOOD SUGAR ONCE DAILY AND AS DIRECTED. DX E11.9 100 each 1  . acetaminophen (TYLENOL) 500 MG tablet Take 1,000 mg by mouth at bedtime.     Marland Kitchen albuterol (PROVENTIL HFA;VENTOLIN HFA) 108 (90 Base) MCG/ACT inhaler Inhale 2 puffs into the lungs every 4 (four) hours as needed for wheezing. 1 Inhaler 3  . amLODipine (NORVASC) 10 MG tablet TAKE 1 TABLET EVERY DAY 90 tablet 1  . aspirin 325 MG tablet Take 325 mg by mouth daily.     . beta carotene w/minerals (OCUVITE) tablet Take 1 tablet by mouth 2 (two) times daily.     . calcium-vitamin D (OSCAL WITH D) 500-200 MG-UNIT per tablet Take 1 tablet by mouth daily.     . Cholecalciferol (VITAMIN D) 1000 UNITS capsule Take 1,000 Units by mouth daily.     . cyanocobalamin (,VITAMIN B-12,) 1000 MCG/ML injection Inject 1,000 mcg into the muscle every 30 (thirty) days.     Marland Kitchen docusate sodium (COLACE) 100 MG capsule Take 200 mg by mouth at bedtime.    . ferrous sulfate 325 (65 FE) MG tablet Take 325 mg by mouth 2 (two) times daily.    Marland Kitchen gemfibrozil (LOPID) 600 MG tablet TAKE 1 TABLET EVERY DAY 90 tablet 1  . glipiZIDE (GLUCOTROL XL) 2.5 MG 24 hr tablet Take 1 tablet (2.5 mg total) by mouth daily with breakfast. 90 tablet 1  . guaiFENesin (ROBITUSSIN) 100 MG/5ML SOLN Take 5 mLs by mouth every 4 (four) hours as needed for cough or to loosen phlegm.    . hydrALAZINE (APRESOLINE) 100 MG tablet Take 100 mg by mouth 3 (three) times daily.     . Lancets (ACCU-CHEK MULTICLIX) lancets Use to check blood sugar once daily and as directed. Dx E11.9 102 each 1  . metoprolol succinate (TOPROL-XL) 25 MG 24  hr tablet TAKE 1/2 TABLET TWICE DAILY 90 tablet 1  . mirtazapine (REMERON) 15 MG tablet TAKE 1/2 TABLET AT BEDTIME 45 tablet 1  . Multiple Vitamin (MULTIVITAMIN) capsule Take 1 capsule by mouth daily.     Marland Kitchen  Polyvinyl Alcohol-Povidone (REFRESH OP) Place 1 drop into both eyes daily as needed (dry eyes).    . sertraline (ZOLOFT) 50 MG tablet Take 1 tablet (50 mg total) by mouth daily. (Patient taking differently: Take 50 mg by mouth every morning. ) 90 tablet 3  . trandolapril (MAVIK) 4 MG tablet TAKE 1 TABLET EVERY DAY 90 tablet 1  . vitamin B-12 (CYANOCOBALAMIN) 100 MCG tablet Take 100 mcg by mouth daily.    Marland Kitchen ALPRAZolam (XANAX) 0.5 MG tablet TAKE 1 TABLET BY MOUTH TWICE A DAY AS NEEDED ANXIETY 30 tablet 3   No current facility-administered medications on file prior to visit.     Review of Systems  Constitutional: Negative for activity change, appetite change, fatigue, fever and unexpected weight change.  HENT: Negative for congestion, ear pain, rhinorrhea, sinus pressure and sore throat.   Eyes: Negative for pain, redness and visual disturbance.  Respiratory: Negative for cough, shortness of breath and wheezing.   Cardiovascular: Positive for leg swelling. Negative for chest pain and palpitations.       Baseline mild leg swelling   Gastrointestinal: Negative for abdominal pain, blood in stool, constipation and diarrhea.  Endocrine: Negative for polydipsia and polyuria.  Genitourinary: Negative for dysuria, frequency and urgency.  Musculoskeletal: Positive for arthralgias and myalgias. Negative for back pain.       R calf pain which is much improved   Skin: Negative for pallor and rash.  Allergic/Immunologic: Negative for environmental allergies.  Neurological: Positive for weakness. Negative for dizziness, syncope and headaches.       Baseline hemiparesis   Hematological: Negative for adenopathy. Does not bruise/bleed easily.  Psychiatric/Behavioral: Negative for decreased concentration  and dysphoric mood. The patient is not nervous/anxious.        Objective:   Physical Exam  Constitutional: She appears well-developed and well-nourished. No distress.  Frail appearing elderly female  HENT:  Head: Normocephalic and atraumatic.  Mouth/Throat: Oropharynx is clear and moist.  TMs clear No cerumen  Eyes: Conjunctivae and EOM are normal. Pupils are equal, round, and reactive to light. No scleral icterus.  Neck: Normal range of motion. Neck supple. No JVD present. Carotid bruit is not present. No thyromegaly present.  Cardiovascular: Normal rate, regular rhythm, normal heart sounds and intact distal pulses. Exam reveals no gallop.  Pulmonary/Chest: Effort normal and breath sounds normal. No respiratory distress. She has no wheezes. She has no rales.  No crackles  Abdominal: Soft. Bowel sounds are normal. She exhibits no distension, no abdominal bruit and no mass. There is no tenderness.  Musculoskeletal: She exhibits edema.  Trace ankle edema today  Wearing supp hose  Very mild post R calf tenderness w/o palp cord/warmth  Neg homan sign  Baseline gait Pedal pulses palpable  Lymphadenopathy:    She has no cervical adenopathy.  Neurological: She is alert. She has normal reflexes. No cranial nerve deficit. She exhibits normal muscle tone.  Baseline hemiplegia   Skin: Skin is warm and dry. No rash noted. No pallor.  hyperpig of skin under supp hose baseline  No cyanosis  Psychiatric: She has a normal mood and affect.  Nl affect today          Assessment & Plan:   Problem List Items Addressed This Visit      Cardiovascular and Mediastinum   Essential hypertension    bp in fair control at this time  BP Readings from Last 1 Encounters:  10/07/17 133/60   No changes needed Disc lifstyle  change with low sodium diet and exercise  Labs reviewed  Better on 2nd check and better than ED reading        Other   B12 deficiency    Will get her inj early today  since she is here      Relevant Medications   cyanocobalamin ((VITAMIN B-12)) injection 1,000 mcg (Completed)   Right calf pain - Primary    Now much improved with neg ED w/u for DVT or other vascular cause  Reviewed hospital records, lab results and studies in detail  Mild tenderness with nl exam today  Continues supp hose No s/s of claudication and pedal pulses are palpable Disc what to watch for  Stretching recommended  Heat/if needed and continued supp hose Update if worse or no further improvement

## 2017-10-23 ENCOUNTER — Ambulatory Visit: Payer: Medicare Other

## 2017-11-05 ENCOUNTER — Other Ambulatory Visit: Payer: Self-pay | Admitting: Family Medicine

## 2017-11-06 DIAGNOSIS — H6123 Impacted cerumen, bilateral: Secondary | ICD-10-CM | POA: Diagnosis not present

## 2017-11-07 ENCOUNTER — Ambulatory Visit: Payer: Medicare Other

## 2017-11-13 ENCOUNTER — Ambulatory Visit (INDEPENDENT_AMBULATORY_CARE_PROVIDER_SITE_OTHER): Payer: Medicare Other

## 2017-11-13 DIAGNOSIS — E538 Deficiency of other specified B group vitamins: Secondary | ICD-10-CM | POA: Diagnosis not present

## 2017-11-13 MED ORDER — CYANOCOBALAMIN 1000 MCG/ML IJ SOLN
1000.0000 ug | Freq: Once | INTRAMUSCULAR | Status: AC
  Administered 2017-11-13: 1000 ug via INTRAMUSCULAR

## 2017-11-18 ENCOUNTER — Other Ambulatory Visit: Payer: Self-pay | Admitting: Family Medicine

## 2017-11-19 DIAGNOSIS — I10 Essential (primary) hypertension: Secondary | ICD-10-CM | POA: Diagnosis not present

## 2017-11-19 DIAGNOSIS — N183 Chronic kidney disease, stage 3 (moderate): Secondary | ICD-10-CM | POA: Diagnosis not present

## 2017-11-19 DIAGNOSIS — R809 Proteinuria, unspecified: Secondary | ICD-10-CM | POA: Diagnosis not present

## 2017-11-19 DIAGNOSIS — R600 Localized edema: Secondary | ICD-10-CM | POA: Diagnosis not present

## 2017-12-12 ENCOUNTER — Encounter: Payer: Self-pay | Admitting: Family Medicine

## 2017-12-12 ENCOUNTER — Ambulatory Visit (INDEPENDENT_AMBULATORY_CARE_PROVIDER_SITE_OTHER): Payer: Medicare Other | Admitting: Family Medicine

## 2017-12-12 ENCOUNTER — Other Ambulatory Visit: Payer: Self-pay

## 2017-12-12 VITALS — BP 110/60 | HR 80 | Temp 97.6°F | Ht 61.5 in | Wt 178.5 lb

## 2017-12-12 DIAGNOSIS — E538 Deficiency of other specified B group vitamins: Secondary | ICD-10-CM

## 2017-12-12 DIAGNOSIS — K529 Noninfective gastroenteritis and colitis, unspecified: Secondary | ICD-10-CM | POA: Diagnosis not present

## 2017-12-12 MED ORDER — CYANOCOBALAMIN 1000 MCG/ML IJ SOLN
1000.0000 ug | Freq: Once | INTRAMUSCULAR | Status: AC
  Administered 2017-12-12: 1000 ug via INTRAMUSCULAR

## 2017-12-12 NOTE — Progress Notes (Signed)
Dr. Frederico Hamman T. Leighton Luster, MD, [facility] [place], [address] Phone: [phone] Fax: [phone]  12/12/2017  Patient: Renee [name], MRN: [id number], DOB: 06/13/1929, 84 y.o.  Primary Physician:  Tower, Wynelle Fanny, MD   Chief Complaint  Patient presents with  . Diarrhea    x 3 days   Subjective:   Renee [name] is a 84 y.o. very pleasant female patient who presents with the following:  Monday went about every hours. Slept good Tuesday - has not had nausea or v.  Has been taking some pepto and some immodium.   Past Medical History, Surgical History, Social History, Family History, Problem List, Medications, and Allergies have been reviewed and updated if relevant.  Patient Active Problem List   Diagnosis Date Noted  . Right calf pain 10/07/2017  . Generalized weakness 12/24/2016  . Respiratory distress 12/11/2016  . Tachypnea 12/11/2016  . Diabetic retinopathy ([place]) 12/11/2016  . History of CVA (cerebrovascular accident) 12/11/2016  . Estrogen deficiency 08/30/2015  . Encounter for Medicare annual wellness exam 04/17/2013  . Mobility impaired 06/26/2011  . History of retinal detachment 01/10/2011  . Sleep apnea 11/28/2010  . Depression with anxiety 08/25/2010  . Hemiplegia, late effect of cerebrovascular disease ([place]) 07/05/2010  . POSTHERPETIC NEURALGIA 11/09/2009  . Osteopenia 12/20/2008  . Renal insufficiency 06/29/2008  . BACK PAIN 01/26/2008  . EDEMA 01/26/2008  . B12 deficiency 01/10/2007  . Diabetes type 2, controlled ([place]) 11/27/2006  . HLD (hyperlipidemia) 11/27/2006  . Essential hypertension 11/27/2006  . FIBROCYSTIC BREAST DISEASE 11/27/2006  . ROSACEA 11/27/2006  . OSTEOARTHRITIS 11/27/2006  . URINARY INCONTINENCE, MIXED 11/27/2006    Past Medical History:  Diagnosis Date  . Angioedema    possibly from voltaren  . Bronchopneumonia 12/11/2016  . Degenerative disc disease   . Diabetes  mellitus    type II  . Hyperlipidemia   . Hypertension   . LVH (left ventricular hypertrophy)    and atrial enlargement by echo in past with nl EF  . Nasal pruritis   . Osteoarthritis   . Osteopenia   . Renal insufficiency   . Sleep apnea   . Stroke [facility]) 05/2010   Small vessel sobcortical (in [place] trial) with Dr Leonie Man, residual L hemiparesis  . Vitamin B 12 deficiency 04/08    Past Surgical History:  Procedure Laterality Date  . ABDOMINAL HYSTERECTOMY     BSO-fibroids  . APPENDECTOMY    . BACK SURGERY    . COLON SURGERY     due to punctured intestines  . EYE SURGERY     cataract extraction  . KNEE SURGERY     arthroscope  . PARS PLANA VITRECTOMY  07/31/2011   Procedure: PARS PLANA VITRECTOMY WITH 25 GAUGE;  Surgeon: [name], MD;  Location: [place];  Service: Ophthalmology;  Laterality: Right;  REMOVAL OF SILICONE OIL AND LASER RIGHT EYE  . RETINAL DETACHMENT SURGERY  02/18/11   times 2  . SPINE SURGERY  08/09   spinal decompression surgery    Social History   Socioeconomic History  . Marital status: Widowed    Spouse name: Not on file  . Number of children: Not on file  . Years of education: Not on file  . Highest education level: Not on file  Occupational History  . Not on file  Social Needs  . Financial resource strain: Not on file  . Food insecurity:  Worry: Not on file    Inability: Not on file  . Transportation needs:    Medical: Not on file    Non-medical: Not on file  Tobacco Use  . Smoking status: Never Smoker  . Smokeless tobacco: Never Used  Substance and Sexual Activity  . Alcohol use: No    Alcohol/week: 0.0 oz  . Drug use: No  . Sexual activity: Never  Lifestyle  . Physical activity:    Days per week: Not on file    Minutes per session: Not on file  . Stress: Not on file  Relationships  . Social connections:    Talks on phone: Not on file    Gets together: Not on file    Attends religious service: Not on file    Active  member of club or organization: Not on file    Attends meetings of clubs or organizations: Not on file    Relationship status: Not on file  . Intimate partner violence:    Fear of current or ex partner: Not on file    Emotionally abused: Not on file    Physically abused: Not on file    Forced sexual activity: Not on file  Other Topics Concern  . Not on file  Social History Narrative  . Not on file    Family History  Problem Relation Age of Onset  . COPD Brother   . Cancer Sister        brain tumor with hemmorhage  . Heart disease Sister        CAD    Allergies  Allergen Reactions  . Diclofenac Sodium Other (See Comments)    REACTION: angioedema  . Pioglitazone Swelling    Medication list reviewed and updated in full in [facility].  ROS: GEN: Acute illness details above GI: Tolerating PO intake GU: maintaining adequate hydration and urination Pulm: No SOB Interactive and getting along well at home.  Otherwise, ROS is as per the HPI.  Objective:   BP 110/60   Pulse 80   Temp 97.6 F (36.4 C) (Oral)   Ht 5' 1.5" (1.562 m)   Wt 178 lb 8 oz (81 kg)   BMI 33.18 kg/m   GEN: WDWN, NAD, Non-toxic, A & O x 3 HEENT: Atraumatic, Normocephalic. Neck supple. No masses, No LAD. Ears and Nose: No external deformity. CV: RRR, No M/G/R. No JVD. No thrill. No extra heart sounds. PULM: CTA B, no wheezes, crackles, rhonchi. No retractions. No resp. distress. No accessory muscle use. ABD: S, NT, ND, hyperactive BS. No rebound. No HSM. EXTR: No c/c/e NEURO Normal gait.  PSYCH: Normally interactive. Conversant. Not depressed or anxious appearing.  Calm demeanor.     Laboratory and Imaging Data:  Assessment and Plan:   Gastroenteritis  B12 deficiency - Plan: cyanocobalamin ((VITAMIN B-12)) injection 1,000 mcg  Supportive care  Follow-up: No follow-ups on file.  Meds ordered this encounter  Medications  . cyanocobalamin ((VITAMIN B-12)) injection 1,000 mcg    Signed,  Eyleen Rawlinson T. Jonn Chaikin, MD   Allergies as of 12/12/2017      Reactions   Diclofenac Sodium Other (See Comments)   REACTION: angioedema   Pioglitazone Swelling      Medication List        Accurate as of 12/12/17  2:03 PM. Always use your most recent med list.          ACCU-CHEK AVIVA PLUS test strip Generic drug:  glucose blood USE TO CHECK BLOOD  SUGAR ONCE DAILY AND AS DIRECTED. DX E11.9   accu-chek multiclix lancets Use to check blood sugar once daily and as directed. Dx E11.9   acetaminophen 500 MG tablet Commonly known as:  TYLENOL Take 1,000 mg by mouth at bedtime.   albuterol 108 (90 Base) MCG/ACT inhaler Commonly known as:  PROVENTIL HFA;VENTOLIN HFA Inhale 2 puffs into the lungs every 4 (four) hours as needed for wheezing.   ALPRAZolam 0.5 MG tablet Commonly known as:  XANAX TAKE 1 TABLET BY MOUTH TWICE A DAY AS NEEDED ANXIETY   amLODipine 10 MG tablet Commonly known as:  NORVASC TAKE 1 TABLET EVERY DAY   aspirin 325 MG tablet Take 325 mg by mouth daily.   beta carotene w/minerals tablet Take 1 tablet by mouth 2 (two) times daily.   calcium-vitamin D 500-200 MG-UNIT tablet Commonly known as:  OSCAL WITH D Take 1 tablet by mouth daily.   vitamin B-12 100 MCG tablet Commonly known as:  CYANOCOBALAMIN Take 100 mcg by mouth daily.   cyanocobalamin 1000 MCG/ML injection Commonly known as:  (VITAMIN B-12) Inject 1,000 mcg into the muscle every 30 (thirty) days.   docusate sodium 100 MG capsule Commonly known as:  COLACE Take 200 mg by mouth at bedtime.   ferrous sulfate 325 (65 FE) MG tablet Take 325 mg by mouth 2 (two) times daily.   gemfibrozil 600 MG tablet Commonly known as:  LOPID TAKE 1 TABLET EVERY DAY   glipiZIDE 2.5 MG 24 hr tablet Commonly known as:  GLUCOTROL XL TAKE 1 TABLET (2.5 MG TOTAL) BY MOUTH DAILY WITH BREAKFAST.   guaiFENesin 100 MG/5ML Soln Commonly known as:  ROBITUSSIN Take 5 mLs by mouth every 4 (four) hours  as needed for cough or to loosen phlegm.   hydrALAZINE 100 MG tablet Commonly known as:  APRESOLINE Take 100 mg by mouth 3 (three) times daily.   metoprolol succinate 25 MG 24 hr tablet Commonly known as:  TOPROL-XL TAKE 1/2 TABLET TWICE DAILY   mirtazapine 15 MG tablet Commonly known as:  REMERON TAKE 1/2 TABLET AT BEDTIME   multivitamin capsule Take 1 capsule by mouth daily.   REFRESH OP Place 1 drop into both eyes daily as needed (dry eyes).   sertraline 50 MG tablet Commonly known as:  ZOLOFT TAKE 1 TABLET EVERY DAY   trandolapril 4 MG tablet Commonly known as:  MAVIK TAKE 1 TABLET EVERY DAY   Vitamin D 1000 units capsule Take 1,000 Units by mouth daily.

## 2017-12-16 ENCOUNTER — Emergency Department (HOSPITAL_COMMUNITY)
Admission: EM | Admit: 2017-12-16 | Discharge: 2017-12-16 | Disposition: A | Discharge status: Home / Self Care | Payer: Medicare Other | Attending: Emergency Medicine | Admitting: Emergency Medicine

## 2017-12-16 ENCOUNTER — Emergency Department (HOSPITAL_COMMUNITY): Payer: Medicare Other

## 2017-12-16 ENCOUNTER — Encounter (HOSPITAL_COMMUNITY): Payer: Self-pay | Admitting: Emergency Medicine

## 2017-12-16 ENCOUNTER — Telehealth: Payer: Self-pay

## 2017-12-16 DIAGNOSIS — R100 Acute abdomen: Secondary | ICD-10-CM | POA: Diagnosis not present

## 2017-12-16 DIAGNOSIS — Z8673 Personal history of transient ischemic attack (TIA), and cerebral infarction without residual deficits: Secondary | ICD-10-CM | POA: Insufficient documentation

## 2017-12-16 DIAGNOSIS — R531 Weakness: Secondary | ICD-10-CM | POA: Diagnosis not present

## 2017-12-16 DIAGNOSIS — I10 Essential (primary) hypertension: Secondary | ICD-10-CM | POA: Insufficient documentation

## 2017-12-16 DIAGNOSIS — R197 Diarrhea, unspecified: Secondary | ICD-10-CM | POA: Diagnosis not present

## 2017-12-16 DIAGNOSIS — K591 Functional diarrhea: Secondary | ICD-10-CM | POA: Diagnosis not present

## 2017-12-16 DIAGNOSIS — Z79899 Other long term (current) drug therapy: Secondary | ICD-10-CM | POA: Insufficient documentation

## 2017-12-16 DIAGNOSIS — R5383 Other fatigue: Secondary | ICD-10-CM | POA: Diagnosis not present

## 2017-12-16 DIAGNOSIS — K529 Noninfective gastroenteritis and colitis, unspecified: Secondary | ICD-10-CM | POA: Diagnosis not present

## 2017-12-16 DIAGNOSIS — E113299 Type 2 diabetes mellitus with mild nonproliferative diabetic retinopathy without macular edema, unspecified eye: Secondary | ICD-10-CM | POA: Insufficient documentation

## 2017-12-16 DIAGNOSIS — Z7984 Long term (current) use of oral hypoglycemic drugs: Secondary | ICD-10-CM | POA: Diagnosis not present

## 2017-12-16 DIAGNOSIS — R1084 Generalized abdominal pain: Secondary | ICD-10-CM | POA: Diagnosis not present

## 2017-12-16 LAB — COMPREHENSIVE METABOLIC PANEL
ALT: 17 U/L (ref 14–54)
AST: 17 U/L (ref 15–41)
Albumin: 2.9 g/dL — ABNORMAL LOW (ref 3.5–5.0)
Alkaline Phosphatase: 85 U/L (ref 38–126)
Anion gap: 9 (ref 5–15)
BUN: 19 mg/dL (ref 6–20)
CO2: 24 mmol/L (ref 22–32)
Calcium: 8.8 mg/dL — ABNORMAL LOW (ref 8.9–10.3)
Chloride: 107 mmol/L (ref 101–111)
Creatinine, Ser: 1.54 mg/dL — ABNORMAL HIGH (ref 0.44–1.00)
GFR calc Af Amer: 34 mL/min — ABNORMAL LOW (ref >60)
GFR calc non Af Amer: 29 mL/min — ABNORMAL LOW (ref >60)
Glucose, Bld: 130 mg/dL — ABNORMAL HIGH (ref 65–99)
Potassium: 3.8 mmol/L (ref 3.5–5.1)
Sodium: 140 mmol/L (ref 135–145)
Total Bilirubin: 0.6 mg/dL (ref 0.3–1.2)
Total Protein: 5.2 g/dL — ABNORMAL LOW (ref 6.5–8.1)

## 2017-12-16 LAB — CBC
HCT: 36 % (ref 36.0–46.0)
Hemoglobin: 11.3 g/dL — ABNORMAL LOW (ref 12.0–15.0)
MCH: 28 pg (ref 26.0–34.0)
MCHC: 31.4 g/dL (ref 30.0–36.0)
MCV: 89.1 fL (ref 78.0–100.0)
Platelets: 242 10*3/uL (ref 150–400)
RBC: 4.04 MIL/uL (ref 3.87–5.11)
RDW: 13.4 % (ref 11.5–15.5)
WBC: 8.1 10*3/uL (ref 4.0–10.5)

## 2017-12-16 LAB — LIPASE, BLOOD: Lipase: 17 U/L (ref 11–51)

## 2017-12-16 MED ORDER — SODIUM CHLORIDE 0.9 % IV BOLUS
1000.0000 mL | Freq: Once | INTRAVENOUS | Status: AC
  Administered 2017-12-16: 1000 mL via INTRAVENOUS

## 2017-12-16 MED ORDER — DIPHENOXYLATE-ATROPINE 2.5-0.025 MG PO TABS: 1.0000 | ORAL_TABLET | Freq: Four times a day (QID) | ORAL | 0 refills | Status: DC | PRN

## 2017-12-16 MED ORDER — DIPHENOXYLATE-ATROPINE 2.5-0.025 MG PO TABS
2.0000 | ORAL_TABLET | Freq: Once | ORAL | Status: AC
  Administered 2017-12-16: 2 via ORAL
  Filled 2017-12-16: qty 2

## 2017-12-16 MED ORDER — CIPROFLOXACIN HCL 500 MG PO TABS: 500.0000 mg | ORAL_TABLET | Freq: Two times a day (BID) | ORAL | 0 refills | Status: DC

## 2017-12-16 MED ORDER — METRONIDAZOLE 500 MG PO TABS: 500.0000 mg | ORAL_TABLET | Freq: Two times a day (BID) | ORAL | 0 refills | Status: DC

## 2017-12-16 MED ORDER — METRONIDAZOLE 500 MG PO TABS
500.0000 mg | ORAL_TABLET | Freq: Once | ORAL | Status: AC
  Administered 2017-12-16: 500 mg via ORAL
  Filled 2017-12-16: qty 1

## 2017-12-16 MED ORDER — CIPROFLOXACIN HCL 500 MG PO TABS
500.0000 mg | ORAL_TABLET | Freq: Once | ORAL | Status: AC
  Administered 2017-12-16: 500 mg via ORAL
  Filled 2017-12-16: qty 1

## 2017-12-16 NOTE — Discharge Instructions (Signed)
You have a bacterial infection of your colon called a "colitis". Push fluids to stay hydrated. Prescriptions for 2 antibiotics, Cipro and Flagyl. Return to ER with ANY worsening. Modal as needed if you are having greater than one episode of loose diarrhea stool per day

## 2017-12-16 NOTE — ED Triage Notes (Addendum)
Pt arrives via ptar from home for c/o nausea and diarrhea x1 week, taking immodium with no relief. Pt denies pain or vomiting. Vss. Pt a/o.

## 2017-12-16 NOTE — ED Provider Notes (Signed)
[facility] Provider Note   CSN: [id number] Arrival date & time: 12/16/17  1027     History   Chief Complaint Chief Complaint  Patient presents with  . Emesis    HPI Renee [name] is a 84 y.o. female.  Chief complaint is diarrhea for 6 days.  HPI: This is a pleasant independent living 84 year old female.  She reports diarrhea for the last 6 days.  Only 1-2 episodes per day.  However she is starting to feel somewhat fatigued.  Not lightheaded.  But a mild generalized weakness.  No vomiting.  Less of an appetite but managing p.o.  Urinating daily.  No dysuria.  She does state that there is "specks" of blood.  And thick mucus in her stools.  Has some generalized abdominal discomfort and cramping prior to having her diarrheal stools.  However does not have localized or consistent abdominal pain.  No recent antibiotic or steroid use.  No travel.  No change in baseline health, or medications.  Past Medical History:  Diagnosis Date  . Angioedema    possibly from voltaren  . Bronchopneumonia 12/11/2016  . Degenerative disc disease   . Diabetes mellitus    type II  . Hyperlipidemia   . Hypertension   . LVH (left ventricular hypertrophy)    and atrial enlargement by echo in past with nl EF  . Nasal pruritis   . Osteoarthritis   . Osteopenia   . Renal insufficiency   . Sleep apnea   . Stroke [facility]) 05/2010   Small vessel sobcortical (in [place] trial) with Dr Leonie Man, residual L hemiparesis  . Vitamin B 12 deficiency 04/08    Patient Active Problem List   Diagnosis Date Noted  . Right calf pain 10/07/2017  . Generalized weakness 12/24/2016  . Respiratory distress 12/11/2016  . Tachypnea 12/11/2016  . Diabetic retinopathy ([place]) 12/11/2016  . History of CVA (cerebrovascular accident) 12/11/2016  . Estrogen deficiency 08/30/2015  . Encounter for Medicare annual wellness exam 04/17/2013  . Mobility impaired 06/26/2011  . History of retinal  detachment 01/10/2011  . Sleep apnea 11/28/2010  . Depression with anxiety 08/25/2010  . Hemiplegia, late effect of cerebrovascular disease ([place]) 07/05/2010  . POSTHERPETIC NEURALGIA 11/09/2009  . Osteopenia 12/20/2008  . Renal insufficiency 06/29/2008  . BACK PAIN 01/26/2008  . EDEMA 01/26/2008  . B12 deficiency 01/10/2007  . Diabetes type 2, controlled ([place]) 11/27/2006  . HLD (hyperlipidemia) 11/27/2006  . Essential hypertension 11/27/2006  . FIBROCYSTIC BREAST DISEASE 11/27/2006  . ROSACEA 11/27/2006  . OSTEOARTHRITIS 11/27/2006  . URINARY INCONTINENCE, MIXED 11/27/2006    Past Surgical History:  Procedure Laterality Date  . ABDOMINAL HYSTERECTOMY     BSO-fibroids  . APPENDECTOMY    . BACK SURGERY    . COLON SURGERY     due to punctured intestines  . EYE SURGERY     cataract extraction  . KNEE SURGERY     arthroscope  . PARS PLANA VITRECTOMY  07/31/2011   Procedure: PARS PLANA VITRECTOMY WITH 25 GAUGE;  Surgeon: [name], MD;  Location: [place];  Service: Ophthalmology;  Laterality: Right;  REMOVAL OF SILICONE OIL AND LASER RIGHT EYE  . RETINAL DETACHMENT SURGERY  02/18/11   times 2  . SPINE SURGERY  08/09   spinal decompression surgery     OB History   None      Home Medications    Prior to Admission medications   Medication Sig Start Date  End Date Taking? Authorizing Provider  ACCU-CHEK AVIVA PLUS test strip USE TO CHECK BLOOD SUGAR ONCE DAILY AND AS DIRECTED. DX E11.9 08/27/17   Tower, Wynelle Fanny, MD  acetaminophen (TYLENOL) 500 MG tablet Take 1,000 mg by mouth at bedtime.     [provider]  albuterol (PROVENTIL HFA;VENTOLIN HFA) 108 (90 Base) MCG/ACT inhaler Inhale 2 puffs into the lungs every 4 (four) hours as needed for wheezing. 12/07/16   Tower, Wynelle Fanny, MD  ALPRAZolam [name]) 0.5 MG tablet TAKE 1 TABLET BY MOUTH TWICE A DAY AS NEEDED ANXIETY 10/07/17   Tower, Wynelle Fanny, MD  amLODipine (NORVASC) 10 MG tablet TAKE 1 TABLET EVERY DAY 10/02/17   Tower,  Wynelle Fanny, MD  aspirin 325 MG tablet Take 325 mg by mouth daily.     [provider]  beta carotene w/minerals (OCUVITE) tablet Take 1 tablet by mouth 2 (two) times daily.     [provider]  calcium-vitamin D (OSCAL WITH D) 500-200 MG-UNIT per tablet Take 1 tablet by mouth daily.     [provider]  Cholecalciferol (VITAMIN D) 1000 UNITS capsule Take 1,000 Units by mouth daily.     [provider]  ciprofloxacin (CIPRO) 500 MG tablet Take 1 tablet (500 mg total) by mouth every 12 (twelve) hours. 12/16/17   Tanna Furry, MD  cyanocobalamin (,VITAMIN B-12,) 1000 MCG/ML injection Inject 1,000 mcg into the muscle every 30 (thirty) days.     [provider]  diphenoxylate-atropine (LOMOTIL) 2.5-0.025 MG tablet Take 1 tablet by mouth 4 (four) times daily as needed for diarrhea or loose stools. 12/16/17   Tanna Furry, MD  docusate sodium (COLACE) 100 MG capsule Take 200 mg by mouth at bedtime.    [provider]  ferrous sulfate 325 (65 FE) MG tablet Take 325 mg by mouth 2 (two) times daily.    [provider]  gemfibrozil (LOPID) 600 MG tablet TAKE 1 TABLET EVERY DAY 11/18/17   Tower, Roque Lias A, MD  glipiZIDE (GLUCOTROL XL) 2.5 MG 24 hr tablet TAKE 1 TABLET (2.5 MG TOTAL) BY MOUTH DAILY WITH BREAKFAST. 10/07/17   Tower, Wynelle Fanny, MD  guaiFENesin (ROBITUSSIN) 100 MG/5ML SOLN Take 5 mLs by mouth every 4 (four) hours as needed for cough or to loosen phlegm.    [provider]  hydrALAZINE (APRESOLINE) 100 MG tablet Take 100 mg by mouth 3 (three) times daily.  08/30/14   [provider]  Lancets (ACCU-CHEK MULTICLIX) lancets Use to check blood sugar once daily and as directed. Dx E11.9 03/06/17   Tower, Wynelle Fanny, MD  metoprolol succinate (TOPROL-XL) 25 MG 24 hr tablet TAKE 1/2 TABLET TWICE DAILY 10/01/17   Tower, Wynelle Fanny, MD  metroNIDAZOLE (FLAGYL) 500 MG tablet Take 1 tablet (500 mg total) by mouth 2 (two) times daily. 12/16/17   Tanna Furry,  MD  mirtazapine (REMERON) 15 MG tablet TAKE 1/2 TABLET AT BEDTIME 09/27/17   Tower, Wynelle Fanny, MD  Multiple Vitamin (MULTIVITAMIN) capsule Take 1 capsule by mouth daily.     [provider]  Polyvinyl Alcohol-Povidone (REFRESH OP) Place 1 drop into both eyes daily as needed (dry eyes).    [provider]  sertraline (ZOLOFT) 50 MG tablet TAKE 1 TABLET EVERY DAY 11/05/17   Tower, Wynelle Fanny, MD  trandolapril (MAVIK) 4 MG tablet TAKE 1 TABLET EVERY DAY 10/02/17   Tower, Wynelle Fanny, MD  vitamin B-12 (CYANOCOBALAMIN) 100 MCG tablet Take 100 mcg by mouth daily.  [provider]    Family History Family History  Problem Relation Age of Onset  . COPD Brother   . Cancer Sister        brain tumor with hemmorhage  . Heart disease Sister        CAD    Social History Social History   Tobacco Use  . Smoking status: Never Smoker  . Smokeless tobacco: Never Used  Substance Use Topics  . Alcohol use: No    Alcohol/week: 0.0 oz  . Drug use: No     Allergies   Diclofenac sodium and Pioglitazone   Review of Systems Review of Systems  Constitutional: Positive for fatigue. Negative for appetite change, chills, diaphoresis and fever.  HENT: Negative for mouth sores, sore throat and trouble swallowing.   Eyes: Negative for visual disturbance.  Respiratory: Negative for cough, chest tightness, shortness of breath and wheezing.   Cardiovascular: Negative for chest pain.  Gastrointestinal: Positive for abdominal pain and diarrhea. Negative for abdominal distention, nausea and vomiting.  Endocrine: Negative for polydipsia, polyphagia and polyuria.  Genitourinary: Negative for dysuria, frequency and hematuria.  Musculoskeletal: Negative for gait problem.  Skin: Negative for color change, pallor and rash.  Neurological: Positive for weakness. Negative for dizziness, syncope, light-headedness and headaches.  Hematological: Does not bruise/bleed easily.  Psychiatric/Behavioral:  Negative for behavioral problems and confusion.     Physical Exam Updated Vital Signs BP (!) 152/68   Pulse 67   Temp 97.7 F (36.5 C) (Oral)   Resp 14   SpO2 100%   Physical Exam  Constitutional: She is oriented to person, place, and time. She appears well-developed and well-nourished. No distress.  Pleasant.  Awake and alert.  HENT:  Head: Normocephalic.  Mucous membranes are not dry.  Conjunctive are not pale.  Eyes: Pupils are equal, round, and reactive to light. Conjunctivae are normal. No scleral icterus.  Neck: Normal range of motion. Neck supple. No thyromegaly present.  Cardiovascular: Normal rate and regular rhythm. Exam reveals no gallop and no friction rub.  No murmur heard. Pulmonary/Chest: Effort normal and breath sounds normal. No respiratory distress. She has no wheezes. She has no rales.  Abdominal: Soft. Bowel sounds are normal. She exhibits no distension. There is no tenderness. There is no rebound.  Slightly hyper active bowel sounds.  No focal areas of pain or tenderness.  Musculoskeletal: Normal range of motion.  Neurological: She is alert and oriented to person, place, and time.  Skin: Skin is warm and dry. No rash noted.  Psychiatric: She has a normal mood and affect. Her behavior is normal.     ED Treatments / Results  Labs (all labs ordered are listed, but only abnormal results are displayed) Labs Reviewed  COMPREHENSIVE METABOLIC PANEL - Abnormal; Notable for the following components:      Result Value   Glucose, Bld 130 (*)    Creatinine, Ser 1.54 (*)    Calcium 8.8 (*)    Total Protein 5.2 (*)    Albumin 2.9 (*)    GFR calc non Af Amer 29 (*)    GFR calc Af Amer 34 (*)    All other components within normal limits  CBC - Abnormal; Notable for the following components:   Hemoglobin 11.3 (*)    All other components within normal limits  LIPASE, BLOOD  URINALYSIS, ROUTINE W REFLEX MICROSCOPIC    EKG None  Radiology Ct Abdomen Pelvis  Wo Contrast  Addendum Date: 12/16/2017   ADDENDUM  REPORT: 12/16/2017 18:57 ADDENDUM: Diffuse increased density in the gallbladder, possible stones or sludge. Electronically Signed   By: Donavan Foil M.D.   On: 12/16/2017 18:57   Result Date: 12/16/2017 CLINICAL DATA:  Diarrhea EXAM: CT ABDOMEN AND PELVIS WITHOUT CONTRAST TECHNIQUE: Multidetector CT imaging of the abdomen and pelvis was performed following the standard protocol without IV contrast. COMPARISON:  Lumbar radiograph 04/01/2008 FINDINGS: Lower chest: Lung bases demonstrate no acute consolidation or effusion. Mild cardiomegaly. Trace pericardial effusion. Small moderate hiatal hernia. Hepatobiliary: No focal hepatic abnormality. Diffuse increased density in the gallbladder lumen. No biliary dilatation. Pancreas: Unremarkable. No pancreatic ductal dilatation or surrounding inflammatory changes. Spleen: Normal in size without focal abnormality. Adrenals/Urinary Tract: Adrenal glands are within normal limits. No hydronephrosis. Low-density lesions within the kidneys, possible cysts but unable to further evaluate without contrast. Possible parapelvic cysts lower pole left kidney. Punctate stones in the right kidney. Bladder unremarkable. Stomach/Bowel: Stomach is nonenlarged. No dilated small bowel. Diffuse wall thickening of the colon. Diverticular present at the sigmoid colon. Appendix not seen consistent with history of appendectomy. Vascular/Lymphatic: Moderate aortic atherosclerosis. No aneurysmal dilatation. No significantly enlarged lymph nodes. Reproductive: Status post hysterectomy. No adnexal masses. Tiny amount of air in the vagina. Other: Negative for free air or free fluid Musculoskeletal: Diffuse degenerative changes. Moderate age indeterminate superior endplate deformity at L4. IMPRESSION: 1. Diffuse wall thickening of the colon down to the rectum; although diverticular disease is present at the sigmoid colon, favor a colitis (infectious,  inflammatory or ischemic) given the diffuse involvement of the colon. Negative for free air. 2. Nonobstructing stones in the right kidney 3. Small moderate hiatal hernia 4. Moderate superior endplate deformity at L4 of uncertain age. Electronically Signed: By: Donavan Foil M.D. On: 12/16/2017 18:51    Procedures Procedures (including critical care time)  Medications Ordered in ED Medications  ciprofloxacin (CIPRO) tablet 500 mg (has no administration in time range)  metroNIDAZOLE (FLAGYL) tablet 500 mg (has no administration in time range)  sodium chloride 0.9 % bolus 1,000 mL (1,000 mLs Intravenous New Bag/Given 12/16/17 1744)  diphenoxylate-atropine (LOMOTIL) 2.5-0.025 MG per tablet 2 tablet (2 tablets Oral Given 12/16/17 1745)     Initial Impression / Assessment and Plan / ED Course  I have reviewed the triage vital signs and the nursing notes.  Pertinent labs & imaging results that were available during my care of the patient were reviewed by me and considered in my medical decision making (see chart for details).   Possible/probable bacterial colitis with blood and mucus in stools.  Does not appear acutely ill.  Plan IV fluids.  Lab.  CT for colitis.  Reevaluation.  Liter fluid.  States she feels considerably better.  Has ambulated.  Has urinated.  Has had Lomotil.  CT scan shows segmental colitis.  No diverticulitis or complications, abscess, or free air.  She states she feels well and would like to be treated as an outpatient.  She is accompanied by her sister, and brother-in-law.  All of her accompanying family feels comfortable with her at home.  They will stay with her.  I discussed with her to continue to hydrate herself orally.  Avoid dairy.  Plan Cipro Flagyl x7 days.  Cautioned to return here with any new or worsening symptoms.  Final Clinical Impressions(s) / ED Diagnoses   Final diagnoses:  Colitis    ED Discharge Orders        Ordered    ciprofloxacin (CIPRO) 500 MG  tablet  Every 12 hours     12/16/17 1934    metroNIDAZOLE (FLAGYL) 500 MG tablet  2 times daily     12/16/17 1934    diphenoxylate-atropine (LOMOTIL) 2.5-0.025 MG tablet  4 times daily PRN     12/16/17 1942       Tanna Furry, MD 12/16/17 1946

## 2017-12-16 NOTE — Telephone Encounter (Signed)
I called Renee [name] to see about scheduling appt for 12/17/17. [name] said that she had spoken with [name] and she is going to ED now. Renee [name] said to leave 12/18/17 appt scheduled for now incase she needs ED f/u otherwise Renee [name] will cb and cancel appt. FYI to DR [employer].

## 2017-12-16 NOTE — ED Notes (Signed)
Patient transported to CT 

## 2017-12-16 NOTE — Telephone Encounter (Signed)
Aware-will watch for notes  

## 2017-12-16 NOTE — Telephone Encounter (Signed)
Copied from [place] [id number]. Topic: Quick Communication - See Telephone Encounter >> Dec 16, 2017  8:39 AM Antonieta Iba C wrote: CRM for notification. See Telephone encounter for: 12/16/17.  Pt called in, pt says that she was seen on Thursday and still have diarrhea. Pt scheduled with PCP (next available) Wed. But pt would like to know if there is anything more that provider could do?   Please advise   CB: [phone]

## 2017-12-18 ENCOUNTER — Encounter: Payer: Self-pay | Admitting: Family Medicine

## 2017-12-18 ENCOUNTER — Ambulatory Visit (INDEPENDENT_AMBULATORY_CARE_PROVIDER_SITE_OTHER): Payer: Medicare Other | Admitting: Family Medicine

## 2017-12-18 ENCOUNTER — Ambulatory Visit: Payer: Medicare Other

## 2017-12-18 VITALS — BP 140/62 | HR 61 | Temp 97.9°F | Ht 61.5 in | Wt 181.8 lb

## 2017-12-18 DIAGNOSIS — N289 Disorder of kidney and ureter, unspecified: Secondary | ICD-10-CM | POA: Diagnosis not present

## 2017-12-18 DIAGNOSIS — K529 Noninfective gastroenteritis and colitis, unspecified: Secondary | ICD-10-CM

## 2017-12-18 MED ORDER — GLIPIZIDE ER 2.5 MG PO TB24: 2.5000 mg | ORAL_TABLET | Freq: Every day | ORAL | 3 refills | Status: DC

## 2017-12-18 NOTE — Patient Instructions (Addendum)
If you feel better- tomorrow you can try some dry toast or crackers very slowly  See how you tolerate it  Advance very slowly as tolerated to bland diet (bananas/rice/apple sauce and toast)  Don't use the lomotil since you no longer have diarrhea   Continue fluids and finish the antibiotics   Update if not starting to improve in a week or if worsening    Labs today for kidney function

## 2017-12-18 NOTE — Progress Notes (Signed)
Subjective:    Patient ID: Renee [name], female    DOB: Nov 14, 1928, 84 y.o.   MRN: [id number]  HPI Here for colitis   She was in the ED on 4/29 for this after 6 d of diarrhea with flecks of blood in her stool   Wt Readings from Last 3 Encounters:  12/18/17 181 lb 12 oz (82.4 kg)  12/12/17 178 lb 8 oz (81 kg)  10/07/17 184 lb 12 oz (83.8 kg)     Results for orders placed or performed during the hospital encounter of 12/16/17  Lipase, blood  Result Value Ref Range   Lipase 17 11 - 51 U/L  Comprehensive metabolic panel  Result Value Ref Range   Sodium 140 135 - 145 mmol/L   Potassium 3.8 3.5 - 5.1 mmol/L   Chloride 107 101 - 111 mmol/L   CO2 24 22 - 32 mmol/L   Glucose, Bld 130 (H) 65 - 99 mg/dL   BUN 19 6 - 20 mg/dL   Creatinine, Ser 1.54 (H) 0.44 - 1.00 mg/dL   Calcium 8.8 (L) 8.9 - 10.3 mg/dL   Total Protein 5.2 (L) 6.5 - 8.1 g/dL   Albumin 2.9 (L) 3.5 - 5.0 g/dL   AST 17 15 - 41 U/L   ALT 17 14 - 54 U/L   Alkaline Phosphatase 85 38 - 126 U/L   Total Bilirubin 0.6 0.3 - 1.2 mg/dL   GFR calc non Af Amer 29 (L) >60 mL/min   GFR calc Af Amer 34 (L) >60 mL/min   Anion gap 9 5 - 15  CBC  Result Value Ref Range   WBC 8.1 4.0 - 10.5 K/uL   RBC 4.04 3.87 - 5.11 MIL/uL   Hemoglobin 11.3 (L) 12.0 - 15.0 g/dL   HCT 36.0 36.0 - 46.0 %   MCV 89.1 78.0 - 100.0 fL   MCH 28.0 26.0 - 34.0 pg   MCHC 31.4 30.0 - 36.0 g/dL   RDW 13.4 11.5 - 15.5 %   Platelets 242 150 - 400 K/uL     Cr up from baseline at 1.54 (usually 1.3)  CT scan Ct Abdomen Pelvis Wo Contrast  Addendum Date: 12/16/2017   ADDENDUM REPORT: 12/16/2017 18:57 ADDENDUM: Diffuse increased density in the gallbladder, possible stones or sludge. Electronically Signed   By: Donavan Foil M.D.   On: 12/16/2017 18:57   Result Date: 12/16/2017 CLINICAL DATA:  Diarrhea EXAM: CT ABDOMEN AND PELVIS WITHOUT CONTRAST TECHNIQUE: Multidetector CT imaging of the abdomen and pelvis was performed following the standard protocol  without IV contrast. COMPARISON:  Lumbar radiograph 04/01/2008 FINDINGS: Lower chest: Lung bases demonstrate no acute consolidation or effusion. Mild cardiomegaly. Trace pericardial effusion. Small moderate hiatal hernia. Hepatobiliary: No focal hepatic abnormality. Diffuse increased density in the gallbladder lumen. No biliary dilatation. Pancreas: Unremarkable. No pancreatic ductal dilatation or surrounding inflammatory changes. Spleen: Normal in size without focal abnormality. Adrenals/Urinary Tract: Adrenal glands are within normal limits. No hydronephrosis. Low-density lesions within the kidneys, possible cysts but unable to further evaluate without contrast. Possible parapelvic cysts lower pole left kidney. Punctate stones in the right kidney. Bladder unremarkable. Stomach/Bowel: Stomach is nonenlarged. No dilated small bowel. Diffuse wall thickening of the colon. Diverticular present at the sigmoid colon. Appendix not seen consistent with history of appendectomy. Vascular/Lymphatic: Moderate aortic atherosclerosis. No aneurysmal dilatation. No significantly enlarged lymph nodes. Reproductive: Status post hysterectomy. No adnexal masses. Tiny amount of air in the vagina. Other: Negative for free air or  free fluid Musculoskeletal: Diffuse degenerative changes. Moderate age indeterminate superior endplate deformity at L4. IMPRESSION: 1. Diffuse wall thickening of the colon down to the rectum; although diverticular disease is present at the sigmoid colon, favor a colitis (infectious, inflammatory or ischemic) given the diffuse involvement of the colon. Negative for free air. 2. Nonobstructing stones in the right kidney 3. Small moderate hiatal hernia 4. Moderate superior endplate deformity at L4 of uncertain age. Electronically Signed: By: Donavan Foil M.D. On: 12/16/2017 18:51    Was diagnosed with colitis (no abscess)  Given IVF and lomotil  tx with cipro and flagyl for 7 d  Told to return if not able to  hydrate at home   No bm since hospital  Liquid diet for a week  Stopped the lomotil  Belly feels ok -making a lot of noise / no pain /not bloated   Drinking lots of water and gatorade   Patient Active Problem List   Diagnosis Date Noted  . Acute colitis 12/18/2017  . Right calf pain 10/07/2017  . Generalized weakness 12/24/2016  . Respiratory distress 12/11/2016  . Tachypnea 12/11/2016  . Diabetic retinopathy ([place]) 12/11/2016  . History of CVA (cerebrovascular accident) 12/11/2016  . Estrogen deficiency 08/30/2015  . Encounter for Medicare annual wellness exam 04/17/2013  . Mobility impaired 06/26/2011  . History of retinal detachment 01/10/2011  . Sleep apnea 11/28/2010  . Depression with anxiety 08/25/2010  . Hemiplegia, late effect of cerebrovascular disease ([place]) 07/05/2010  . POSTHERPETIC NEURALGIA 11/09/2009  . Osteopenia 12/20/2008  . Renal insufficiency 06/29/2008  . BACK PAIN 01/26/2008  . EDEMA 01/26/2008  . B12 deficiency 01/10/2007  . Diabetes type 2, controlled ([place]) 11/27/2006  . HLD (hyperlipidemia) 11/27/2006  . Essential hypertension 11/27/2006  . FIBROCYSTIC BREAST DISEASE 11/27/2006  . ROSACEA 11/27/2006  . OSTEOARTHRITIS 11/27/2006  . URINARY INCONTINENCE, MIXED 11/27/2006   Past Medical History:  Diagnosis Date  . Angioedema    possibly from voltaren  . Bronchopneumonia 12/11/2016  . Degenerative disc disease   . Diabetes mellitus    type II  . Hyperlipidemia   . Hypertension   . LVH (left ventricular hypertrophy)    and atrial enlargement by echo in past with nl EF  . Nasal pruritis   . Osteoarthritis   . Osteopenia   . Renal insufficiency   . Sleep apnea   . Stroke [facility]) 05/2010   Small vessel sobcortical ([place]) with Dr Leonie Man, residual L hemiparesis  . Vitamin B 12 deficiency 04/08   Past Surgical History:  Procedure Laterality Date  . ABDOMINAL HYSTERECTOMY     BSO-fibroids  . APPENDECTOMY    . BACK SURGERY    . COLON  SURGERY     due to punctured intestines  . EYE SURGERY     cataract extraction  . KNEE SURGERY     arthroscope  . PARS PLANA VITRECTOMY  07/31/2011   Procedure: PARS PLANA VITRECTOMY WITH 25 GAUGE;  Surgeon: [name], MD;  Location: [facility];  Service: Ophthalmology;  Laterality: Right;  REMOVAL OF SILICONE OIL AND LASER RIGHT EYE  . RETINAL DETACHMENT SURGERY  02/18/11   times 2  . SPINE SURGERY  08/09   spinal decompression surgery   Social History   Tobacco Use  . Smoking status: Never Smoker  . Smokeless tobacco: Never Used  Substance Use Topics  . Alcohol use: No    Alcohol/week: 0.0 oz  . Drug use: No   Family  History  Problem Relation Age of Onset  . COPD Brother   . Cancer Sister        brain tumor with hemmorhage  . Heart disease Sister        CAD   Allergies  Allergen Reactions  . Diclofenac Sodium Other (See Comments)    REACTION: angioedema  . Pioglitazone Swelling   Current Outpatient Medications on File Prior to Visit  Medication Sig Dispense Refill  . ACCU-CHEK AVIVA PLUS test strip USE TO CHECK BLOOD SUGAR ONCE DAILY AND AS DIRECTED. DX E11.9 100 each 1  . acetaminophen (TYLENOL) 500 MG tablet Take 1,000 mg by mouth at bedtime.     Marland Kitchen albuterol (PROVENTIL HFA;VENTOLIN HFA) 108 (90 Base) MCG/ACT inhaler Inhale 2 puffs into the lungs every 4 (four) hours as needed for wheezing. 1 Inhaler 3  . ALPRAZolam (XANAX) 0.5 MG tablet TAKE 1 TABLET BY MOUTH TWICE A DAY AS NEEDED ANXIETY 30 tablet 3  . amLODipine (NORVASC) 10 MG tablet TAKE 1 TABLET EVERY DAY 90 tablet 1  . aspirin 325 MG tablet Take 325 mg by mouth daily.     . beta carotene w/minerals (OCUVITE) tablet Take 1 tablet by mouth 2 (two) times daily.     . calcium-vitamin D (OSCAL WITH D) 500-200 MG-UNIT per tablet Take 1 tablet by mouth daily.     . Cholecalciferol (VITAMIN D) 1000 UNITS capsule Take 1,000 Units by mouth daily.     . ciprofloxacin (CIPRO) 500 MG tablet Take 1 tablet (500 mg total) by  mouth every 12 (twelve) hours. 20 tablet 0  . cyanocobalamin (,VITAMIN B-12,) 1000 MCG/ML injection Inject 1,000 mcg into the muscle every 30 (thirty) days.     . diphenoxylate-atropine (LOMOTIL) 2.5-0.025 MG tablet Take 1 tablet by mouth 4 (four) times daily as needed for diarrhea or loose stools. 30 tablet 0  . docusate sodium (COLACE) 100 MG capsule Take 200 mg by mouth at bedtime.    . ferrous sulfate 325 (65 FE) MG tablet Take 325 mg by mouth 2 (two) times daily.    Marland Kitchen gemfibrozil (LOPID) 600 MG tablet TAKE 1 TABLET EVERY DAY 90 tablet 1  . guaiFENesin (ROBITUSSIN) 100 MG/5ML SOLN Take 5 mLs by mouth every 4 (four) hours as needed for cough or to loosen phlegm.    . hydrALAZINE (APRESOLINE) 100 MG tablet Take 100 mg by mouth 3 (three) times daily.     . Lancets (ACCU-CHEK MULTICLIX) lancets Use to check blood sugar once daily and as directed. Dx E11.9 102 each 1  . metoprolol succinate (TOPROL-XL) 25 MG 24 hr tablet TAKE 1/2 TABLET TWICE DAILY 90 tablet 1  . metroNIDAZOLE (FLAGYL) 500 MG tablet Take 1 tablet (500 mg total) by mouth 2 (two) times daily. 14 tablet 0  . mirtazapine (REMERON) 15 MG tablet TAKE 1/2 TABLET AT BEDTIME 45 tablet 1  . Multiple Vitamin (MULTIVITAMIN) capsule Take 1 capsule by mouth daily.     . Polyvinyl Alcohol-Povidone (REFRESH OP) Place 1 drop into both eyes daily as needed (dry eyes).    . sertraline (ZOLOFT) 50 MG tablet TAKE 1 TABLET EVERY DAY 90 tablet 1  . trandolapril (MAVIK) 4 MG tablet TAKE 1 TABLET EVERY DAY 90 tablet 1  . vitamin B-12 (CYANOCOBALAMIN) 100 MCG tablet Take 100 mcg by mouth daily.     No current facility-administered medications on file prior to visit.     Review of Systems  Constitutional: Negative for activity change, appetite change,  fatigue, fever and unexpected weight change.  HENT: Negative for congestion, ear pain, rhinorrhea, sinus pressure and sore throat.   Eyes: Negative for pain, redness and visual disturbance.  Respiratory:  Negative for cough, shortness of breath and wheezing.   Cardiovascular: Negative for chest pain and palpitations.  Gastrointestinal: Positive for abdominal pain. Negative for anal bleeding, blood in stool, constipation, diarrhea, nausea, rectal pain and vomiting.  Endocrine: Negative for polydipsia and polyuria.  Genitourinary: Negative for dysuria, frequency and urgency.  Musculoskeletal: Negative for arthralgias, back pain and myalgias.  Skin: Negative for pallor and rash.  Allergic/Immunologic: Negative for environmental allergies.  Neurological: Negative for dizziness, syncope and headaches.  Hematological: Negative for adenopathy. Does not bruise/bleed easily.  Psychiatric/Behavioral: Negative for decreased concentration and dysphoric mood. The patient is not nervous/anxious.        Objective:   Physical Exam  Constitutional: She appears well-developed and well-nourished. No distress.  overwt and well appearing   HENT:  Head: Normocephalic and atraumatic.  Mouth/Throat: Oropharynx is clear and moist.  Eyes: Pupils are equal, round, and reactive to light. Conjunctivae and EOM are normal. No scleral icterus.  Neck: Normal range of motion. Neck supple.  Cardiovascular: Normal rate, regular rhythm and normal heart sounds.  Pulmonary/Chest: Effort normal and breath sounds normal. No respiratory distress. She has no wheezes. She has no rales.  Abdominal: Soft. Bowel sounds are normal. She exhibits no distension and no mass. There is tenderness. There is no rebound and no guarding. No hernia.  Very mild tenderness to deep palp in bilat LQ No rebound or guarding No murphy sign  Lymphadenopathy:    She has no cervical adenopathy.  Neurological: She is alert.  Skin: Skin is warm and dry. No erythema. No pallor.  Psychiatric: She has a normal mood and affect.          Assessment & Plan:   Problem List Items Addressed This Visit      Digestive   Acute colitis - Primary    Seen  for diarrhea and malaise  tx with cipro and flagyl with improvement  CT shows signs of colitis w/o diverticulitis On liquid diet  Can adv very slowly to BRAT and then bland diet  Consider GI eval if this returns  Update if not starting to improve in a week or if worsening          Genitourinary   Renal insufficiency    Cr was up from baseline with colitis  Now on cipro and flagyl (cipro may worsen)  Check renal panel      Relevant Orders   Renal function panel (Completed)

## 2017-12-19 LAB — RENAL FUNCTION PANEL
Albumin: 3.5 g/dL (ref 3.5–5.2)
BUN: 24 mg/dL — ABNORMAL HIGH (ref 6–23)
CO2: 23 mEq/L (ref 19–32)
Calcium: 8.6 mg/dL (ref 8.4–10.5)
Chloride: 109 mEq/L (ref 96–112)
Creatinine, Ser: 1.76 mg/dL — ABNORMAL HIGH (ref 0.40–1.20)
GFR: 29.05 mL/min — ABNORMAL LOW (ref >60.00)
Glucose, Bld: 122 mg/dL — ABNORMAL HIGH (ref 70–99)
Phosphorus: 3.8 mg/dL (ref 2.3–4.6)
Potassium: 4.4 mEq/L (ref 3.5–5.1)
Sodium: 140 mEq/L (ref 135–145)

## 2017-12-20 ENCOUNTER — Other Ambulatory Visit: Payer: Self-pay | Admitting: *Deleted

## 2017-12-22 NOTE — Assessment & Plan Note (Signed)
Cr was up from baseline with colitis  Now on cipro and flagyl (cipro may worsen)  Check renal panel

## 2017-12-22 NOTE — Assessment & Plan Note (Signed)
Seen for diarrhea and malaise  tx with cipro and flagyl with improvement  CT shows signs of colitis w/o diverticulitis On liquid diet  Can adv very slowly to BRAT and then bland diet  Consider GI eval if this returns  Update if not starting to improve in a week or if worsening

## 2017-12-27 ENCOUNTER — Encounter: Payer: Self-pay | Admitting: Family Medicine

## 2017-12-27 ENCOUNTER — Ambulatory Visit (INDEPENDENT_AMBULATORY_CARE_PROVIDER_SITE_OTHER): Payer: Medicare Other | Admitting: Family Medicine

## 2017-12-27 VITALS — BP 128/66 | HR 66 | Temp 98.2°F | Ht 61.5 in | Wt 183.8 lb

## 2017-12-27 DIAGNOSIS — R531 Weakness: Secondary | ICD-10-CM | POA: Diagnosis not present

## 2017-12-27 DIAGNOSIS — K529 Noninfective gastroenteritis and colitis, unspecified: Secondary | ICD-10-CM | POA: Diagnosis not present

## 2017-12-27 DIAGNOSIS — Z8673 Personal history of transient ischemic attack (TIA), and cerebral infarction without residual deficits: Secondary | ICD-10-CM

## 2017-12-27 DIAGNOSIS — N289 Disorder of kidney and ureter, unspecified: Secondary | ICD-10-CM | POA: Diagnosis not present

## 2017-12-27 DIAGNOSIS — Z7409 Other reduced mobility: Secondary | ICD-10-CM

## 2017-12-27 NOTE — Patient Instructions (Addendum)
Work your way back to a more regular diet  Get protein with each meal -  Meat or dairy or eggs of beans or soy or nut butter (like peanut butter)  Otherwise-you can supplement with an ensure   Keep walking with your walks - short walks frequently  Stay hydrated   To restore bacterial balance in GI system - yogurt is good You can also get Align ( a probiotic supplement over the counter) and take it as directed for about 2 weeks    (this may help bloating and gas)  Avoid carbonated beverages  Avoid using a straw  Avoid chewing gum   If symptoms change or worsen please let me know   I will start working on a referral for in home physical therapy   Also elevate feet when sitting   Your kidney number was up - we are re checking it today  It should be better with more fluid and no more antibiotic

## 2017-12-27 NOTE — Progress Notes (Signed)
Subjective:    Patient ID: Renee [name], female    DOB: Dec 18, 1928, 84 y.o.   MRN: [id number]  HPI Here for frequent  stools/general weakness and gas/bloating  She is having a hard time getting her energy back after a bout of colitis  req PT consult   A lot of gas on her stomach  Some belching and flatus  No nausea or vomiting at all  No blood in stool  No rectal pain  No fever  No abdominal pain   Has at least 2 bm per day  No diarrhea - bms are still normal   Yogurt - eating    ? If not enough protein  Told by her nurse to drink 2 ensure per day  Diet - eating potato soup and jello and oatmeal and grits  occ gravy biscuit  Not a lot of meat  Ate lasagne -went right through her (urgent bm but normal)  Some ice cream and sour cream  A pimento cheese sandwich   Not getting much strength back  Walking to mail box and back twice daily     Wt Readings from Last 3 Encounters:  12/27/17 183 lb 12 oz (83.3 kg)  12/18/17 181 lb 12 oz (82.4 kg)  12/12/17 178 lb 8 oz (81 kg)  is drinking fluids  34.16 kg/m   She was seen last 5/1 after bout of acute colitis diagnosed in ED Treated with IVF and lomotil and cipro and flagyl for colitis (susp infectious/inflammatory or ischemic)   Renal insuff did worsen with the abx and dehydration Lab Results  Component Value Date   CREATININE 1.76 (H) 12/18/2017   BUN 24 (H) 12/18/2017   NA 140 12/18/2017   K 4.4 12/18/2017   CL 109 12/18/2017   CO2 23 12/18/2017   Lab Results  Component Value Date   WBC 8.1 12/16/2017   HGB 11.3 (L) 12/16/2017   HCT 36.0 12/16/2017   MCV 89.1 12/16/2017   PLT 242 12/16/2017     CT colon Ct Abdomen Pelvis Wo Contrast  Addendum Date: 12/16/2017   ADDENDUM REPORT: 12/16/2017 18:57 ADDENDUM: Diffuse increased density in the gallbladder, possible stones or sludge. Electronically Signed   By: Donavan Foil M.D.   On: 12/16/2017 18:57   Result Date: 12/16/2017 CLINICAL DATA:  Diarrhea  EXAM: CT ABDOMEN AND PELVIS WITHOUT CONTRAST TECHNIQUE: Multidetector CT imaging of the abdomen and pelvis was performed following the standard protocol without IV contrast. COMPARISON:  Lumbar radiograph 04/01/2008 FINDINGS: Lower chest: Lung bases demonstrate no acute consolidation or effusion. Mild cardiomegaly. Trace pericardial effusion. Small moderate hiatal hernia. Hepatobiliary: No focal hepatic abnormality. Diffuse increased density in the gallbladder lumen. No biliary dilatation. Pancreas: Unremarkable. No pancreatic ductal dilatation or surrounding inflammatory changes. Spleen: Normal in size without focal abnormality. Adrenals/Urinary Tract: Adrenal glands are within normal limits. No hydronephrosis. Low-density lesions within the kidneys, possible cysts but unable to further evaluate without contrast. Possible parapelvic cysts lower pole left kidney. Punctate stones in the right kidney. Bladder unremarkable. Stomach/Bowel: Stomach is nonenlarged. No dilated small bowel. Diffuse wall thickening of the colon. Diverticular present at the sigmoid colon. Appendix not seen consistent with history of appendectomy. Vascular/Lymphatic: Moderate aortic atherosclerosis. No aneurysmal dilatation. No significantly enlarged lymph nodes. Reproductive: Status post hysterectomy. No adnexal masses. Tiny amount of air in the vagina. Other: Negative for free air or free fluid Musculoskeletal: Diffuse degenerative changes. Moderate age indeterminate superior endplate deformity at L4. IMPRESSION: 1. Diffuse wall  thickening of the colon down to the rectum; although diverticular disease is present at the sigmoid colon, favor a colitis (infectious, inflammatory or ischemic) given the diffuse involvement of the colon. Negative for free air. 2. Nonobstructing stones in the right kidney 3. Small moderate hiatal hernia 4. Moderate superior endplate deformity at L4 of uncertain age. Electronically Signed: By: Donavan Foil M.D. On:  12/16/2017 18:51     Patient Active Problem List   Diagnosis Date Noted  . Acute colitis 12/18/2017  . Right calf pain 10/07/2017  . Generalized weakness 12/24/2016  . Respiratory distress 12/11/2016  . Tachypnea 12/11/2016  . Diabetic retinopathy ([place]) 12/11/2016  . History of CVA (cerebrovascular accident) 12/11/2016  . Estrogen deficiency 08/30/2015  . Encounter for Medicare annual wellness exam 04/17/2013  . Mobility impaired 06/26/2011  . History of retinal detachment 01/10/2011  . Sleep apnea 11/28/2010  . Depression with anxiety 08/25/2010  . Hemiplegia, late effect of cerebrovascular disease ([place]) 07/05/2010  . POSTHERPETIC NEURALGIA 11/09/2009  . Osteopenia 12/20/2008  . Renal insufficiency 06/29/2008  . BACK PAIN 01/26/2008  . EDEMA 01/26/2008  . B12 deficiency 01/10/2007  . Diabetes type 2, controlled ([place]) 11/27/2006  . HLD (hyperlipidemia) 11/27/2006  . Essential hypertension 11/27/2006  . FIBROCYSTIC BREAST DISEASE 11/27/2006  . ROSACEA 11/27/2006  . OSTEOARTHRITIS 11/27/2006  . URINARY INCONTINENCE, MIXED 11/27/2006   Past Medical History:  Diagnosis Date  . Angioedema    possibly from voltaren  . Bronchopneumonia 12/11/2016  . Degenerative disc disease   . Diabetes mellitus    type II  . Hyperlipidemia   . Hypertension   . LVH (left ventricular hypertrophy)    and atrial enlargement by echo in past with nl EF  . Nasal pruritis   . Osteoarthritis   . Osteopenia   . Renal insufficiency   . Sleep apnea   . Stroke [facility]) 05/2010   Small vessel sobcortical (in [place] trial) with Dr Leonie Man, residual L hemiparesis  . Vitamin B 12 deficiency 04/08   Past Surgical History:  Procedure Laterality Date  . ABDOMINAL HYSTERECTOMY     BSO-fibroids  . APPENDECTOMY    . BACK SURGERY    . COLON SURGERY     due to punctured intestines  . EYE SURGERY     cataract extraction  . KNEE SURGERY     arthroscope  . PARS PLANA VITRECTOMY  07/31/2011   Procedure:  PARS PLANA VITRECTOMY WITH 25 GAUGE;  Surgeon: [name], MD;  Location: [facility];  Service: Ophthalmology;  Laterality: Right;  REMOVAL OF SILICONE OIL AND LASER RIGHT EYE  . RETINAL DETACHMENT SURGERY  02/18/11   times 2  . SPINE SURGERY  08/09   spinal decompression surgery   Social History   Tobacco Use  . Smoking status: Never Smoker  . Smokeless tobacco: Never Used  Substance Use Topics  . Alcohol use: No    Alcohol/week: 0.0 oz  . Drug use: No   Family History  Problem Relation Age of Onset  . COPD Brother   . Cancer Sister        brain tumor with hemmorhage  . Heart disease Sister        CAD   Allergies  Allergen Reactions  . Diclofenac Sodium Other (See Comments)    REACTION: angioedema  . Pioglitazone Swelling   Current Outpatient Medications on File Prior to Visit  Medication Sig Dispense Refill  . ACCU-CHEK AVIVA PLUS test strip USE TO CHECK BLOOD  SUGAR ONCE DAILY AND AS DIRECTED. DX E11.9 100 each 1  . acetaminophen (TYLENOL) 500 MG tablet Take 1,000 mg by mouth at bedtime.     Marland Kitchen albuterol (PROVENTIL HFA;VENTOLIN HFA) 108 (90 Base) MCG/ACT inhaler Inhale 2 puffs into the lungs every 4 (four) hours as needed for wheezing. 1 Inhaler 3  . ALPRAZolam (XANAX) 0.5 MG tablet TAKE 1 TABLET BY MOUTH TWICE A DAY AS NEEDED ANXIETY 30 tablet 3  . amLODipine (NORVASC) 10 MG tablet TAKE 1 TABLET EVERY DAY 90 tablet 1  . aspirin 325 MG tablet Take 325 mg by mouth daily.     . beta carotene w/minerals (OCUVITE) tablet Take 1 tablet by mouth 2 (two) times daily.     . calcium-vitamin D (OSCAL WITH D) 500-200 MG-UNIT per tablet Take 1 tablet by mouth daily.     . Cholecalciferol (VITAMIN D) 1000 UNITS capsule Take 1,000 Units by mouth daily.     . cyanocobalamin (,VITAMIN B-12,) 1000 MCG/ML injection Inject 1,000 mcg into the muscle every 30 (thirty) days.     Marland Kitchen docusate sodium (COLACE) 100 MG capsule Take 200 mg by mouth at bedtime.    . ferrous sulfate 325 (65 FE) MG tablet  Take 325 mg by mouth 2 (two) times daily.    Marland Kitchen gemfibrozil (LOPID) 600 MG tablet TAKE 1 TABLET EVERY DAY 90 tablet 1  . glipiZIDE (GLUCOTROL XL) 2.5 MG 24 hr tablet Take 1 tablet (2.5 mg total) by mouth daily with breakfast. 90 tablet 3  . guaiFENesin (ROBITUSSIN) 100 MG/5ML SOLN Take 5 mLs by mouth every 4 (four) hours as needed for cough or to loosen phlegm.    . hydrALAZINE (APRESOLINE) 100 MG tablet Take 100 mg by mouth 3 (three) times daily.     . Lancets (ACCU-CHEK MULTICLIX) lancets Use to check blood sugar once daily and as directed. Dx E11.9 102 each 1  . metoprolol succinate (TOPROL-XL) 25 MG 24 hr tablet TAKE 1/2 TABLET TWICE DAILY 90 tablet 1  . mirtazapine (REMERON) 15 MG tablet TAKE 1/2 TABLET AT BEDTIME 45 tablet 1  . Multiple Vitamin (MULTIVITAMIN) capsule Take 1 capsule by mouth daily.     . Polyvinyl Alcohol-Povidone (REFRESH OP) Place 1 drop into both eyes daily as needed (dry eyes).    . sertraline (ZOLOFT) 50 MG tablet TAKE 1 TABLET EVERY DAY 90 tablet 1  . trandolapril (MAVIK) 4 MG tablet TAKE 1 TABLET EVERY DAY 90 tablet 1  . vitamin B-12 (CYANOCOBALAMIN) 100 MCG tablet Take 100 mcg by mouth daily.     No current facility-administered medications on file prior to visit.     Review of Systems  Constitutional: Positive for fatigue. Negative for activity change, appetite change, fever and unexpected weight change.  HENT: Negative for congestion, ear pain, rhinorrhea, sinus pressure and sore throat.   Eyes: Negative for pain, redness and visual disturbance.  Respiratory: Negative for cough, shortness of breath and wheezing.   Cardiovascular: Negative for chest pain and palpitations.  Gastrointestinal: Positive for abdominal distention. Negative for abdominal pain, anal bleeding, blood in stool, constipation, diarrhea, nausea, rectal pain and vomiting.       Urgent stools after eating -sometimes loose and some times normal  Endocrine: Negative for polydipsia and polyuria.    Genitourinary: Negative for dysuria, frequency and urgency.  Musculoskeletal: Negative for arthralgias, back pain and myalgias.  Skin: Negative for pallor and rash.  Allergic/Immunologic: Negative for environmental allergies.  Neurological: Negative for dizziness, syncope and  headaches.  Hematological: Negative for adenopathy. Does not bruise/bleed easily.  Psychiatric/Behavioral: Negative for decreased concentration and dysphoric mood. The patient is not nervous/anxious.        Objective:   Physical Exam  Constitutional: She appears well-developed and well-nourished. No distress.  Marland Kitchenobese and well appearing   HENT:  Head: Normocephalic and atraumatic.  Mouth/Throat: Oropharynx is clear and moist.  Eyes: Pupils are equal, round, and reactive to light. Conjunctivae and EOM are normal. No scleral icterus.  Neck: Normal range of motion. Neck supple.  Cardiovascular: Normal rate, regular rhythm and normal heart sounds.  Pulmonary/Chest: Effort normal and breath sounds normal. No respiratory distress. She has no wheezes. She has no rales.  Abdominal: Soft. Bowel sounds are normal. She exhibits no distension, no abdominal bruit, no pulsatile midline mass and no mass. There is tenderness. There is no rigidity, no rebound, no guarding, no CVA tenderness, no tenderness at McBurney's point and negative Murphy's sign. No hernia.  Mild abd tenderness to deep palpation bilat LQs    Musculoskeletal: She exhibits edema.  Trace pedal edema   Lymphadenopathy:    She has no cervical adenopathy.  Neurological: She is alert.  Baseline hemiparesis   Needs help to stand and walker for support   Skin: Skin is warm and dry. No erythema. No pallor.  No jaundice   Psychiatric: She has a normal mood and affect.          Assessment & Plan:   Problem List Items Addressed This Visit      Digestive   Acute colitis    Clinically improving -now with mainly bloating and some urgent stools  Poss  bacterial overgrowth /bacterial imbalance  Also fatigued and deconditioned   Suggest yogurt/probiotics  Inc protein in diet  Alert if abd pain or no further improvement         Genitourinary   Renal insufficiency    Cr went up in response to cipro and dehydration - to 7.6 Enc fluids/she is drinking them and now done with cipro  Renal panel today        Relevant Orders   Basic metabolic panel (Completed)     Other   Generalized weakness - Primary    After bout of colitis In setting of past cva with hemiparesis  req home PT eval        Relevant Orders   Ambulatory referral to [place]   History of CVA (cerebrovascular accident)   Relevant Orders   Ambulatory referral to [place] impaired   Relevant Orders   Ambulatory referral to [place]

## 2017-12-28 LAB — BASIC METABOLIC PANEL
BUN/Creatinine Ratio: 15 (calc) (ref 6–22)
BUN: 22 mg/dL (ref 7–25)
CO2: 23 mmol/L (ref 20–32)
Calcium: 8.5 mg/dL — ABNORMAL LOW (ref 8.6–10.4)
Chloride: 109 mmol/L (ref 98–110)
Creat: 1.45 mg/dL — ABNORMAL HIGH (ref 0.60–0.88)
Glucose, Bld: 248 mg/dL — ABNORMAL HIGH (ref 65–99)
Potassium: 4.9 mmol/L (ref 3.5–5.3)
Sodium: 142 mmol/L (ref 135–146)

## 2017-12-28 NOTE — Assessment & Plan Note (Signed)
After bout of colitis In setting of past cva with hemiparesis  req home PT eval

## 2017-12-28 NOTE — Assessment & Plan Note (Signed)
Clinically improving -now with mainly bloating and some urgent stools  Poss bacterial overgrowth /bacterial imbalance  Also fatigued and deconditioned   Suggest yogurt/probiotics  Inc protein in diet  Alert if abd pain or no further improvement

## 2017-12-28 NOTE — Assessment & Plan Note (Signed)
Cr went up in response to cipro and dehydration - to 7.6 Enc fluids/she is drinking them and now done with cipro  Renal panel today

## 2018-01-02 DIAGNOSIS — F329 Major depressive disorder, single episode, unspecified: Secondary | ICD-10-CM | POA: Diagnosis not present

## 2018-01-02 DIAGNOSIS — I69354 Hemiplegia and hemiparesis following cerebral infarction affecting left non-dominant side: Secondary | ICD-10-CM | POA: Diagnosis not present

## 2018-01-02 DIAGNOSIS — I10 Essential (primary) hypertension: Secondary | ICD-10-CM | POA: Diagnosis not present

## 2018-01-02 DIAGNOSIS — E11319 Type 2 diabetes mellitus with unspecified diabetic retinopathy without macular edema: Secondary | ICD-10-CM | POA: Diagnosis not present

## 2018-01-02 DIAGNOSIS — M549 Dorsalgia, unspecified: Secondary | ICD-10-CM | POA: Diagnosis not present

## 2018-01-02 DIAGNOSIS — F419 Anxiety disorder, unspecified: Secondary | ICD-10-CM | POA: Diagnosis not present

## 2018-01-03 ENCOUNTER — Telehealth: Payer: Self-pay | Admitting: Family Medicine

## 2018-01-03 NOTE — Telephone Encounter (Signed)
Please verbally ok those orders 

## 2018-01-03 NOTE — Telephone Encounter (Signed)
Left VM giving Claiborne Billings the verbal order

## 2018-01-03 NOTE — Telephone Encounter (Signed)
Copied from [place] [id number]. Topic: Quick Communication - See Telephone Encounter >> Jan 03, 2018  9:56 AM Cleaster Corin, NT wrote: CRM for notification. See Telephone encounter for: 01/03/18.  Claiborne Billings calling from [facility] home health for verbal orders for PT 3 week 3 and 2 week 5 Claiborne Billings can be reached at  [phone]

## 2018-01-06 DIAGNOSIS — F419 Anxiety disorder, unspecified: Secondary | ICD-10-CM | POA: Diagnosis not present

## 2018-01-06 DIAGNOSIS — I69354 Hemiplegia and hemiparesis following cerebral infarction affecting left non-dominant side: Secondary | ICD-10-CM | POA: Diagnosis not present

## 2018-01-06 DIAGNOSIS — E11319 Type 2 diabetes mellitus with unspecified diabetic retinopathy without macular edema: Secondary | ICD-10-CM | POA: Diagnosis not present

## 2018-01-06 DIAGNOSIS — F329 Major depressive disorder, single episode, unspecified: Secondary | ICD-10-CM | POA: Diagnosis not present

## 2018-01-06 DIAGNOSIS — M549 Dorsalgia, unspecified: Secondary | ICD-10-CM | POA: Diagnosis not present

## 2018-01-06 DIAGNOSIS — I10 Essential (primary) hypertension: Secondary | ICD-10-CM | POA: Diagnosis not present

## 2018-01-08 DIAGNOSIS — M549 Dorsalgia, unspecified: Secondary | ICD-10-CM | POA: Diagnosis not present

## 2018-01-08 DIAGNOSIS — F419 Anxiety disorder, unspecified: Secondary | ICD-10-CM | POA: Diagnosis not present

## 2018-01-08 DIAGNOSIS — I69354 Hemiplegia and hemiparesis following cerebral infarction affecting left non-dominant side: Secondary | ICD-10-CM | POA: Diagnosis not present

## 2018-01-08 DIAGNOSIS — I10 Essential (primary) hypertension: Secondary | ICD-10-CM | POA: Diagnosis not present

## 2018-01-08 DIAGNOSIS — F329 Major depressive disorder, single episode, unspecified: Secondary | ICD-10-CM | POA: Diagnosis not present

## 2018-01-08 DIAGNOSIS — E11319 Type 2 diabetes mellitus with unspecified diabetic retinopathy without macular edema: Secondary | ICD-10-CM | POA: Diagnosis not present

## 2018-01-09 ENCOUNTER — Telehealth: Payer: Self-pay | Admitting: Family Medicine

## 2018-01-09 DIAGNOSIS — F419 Anxiety disorder, unspecified: Secondary | ICD-10-CM | POA: Diagnosis not present

## 2018-01-09 DIAGNOSIS — I69354 Hemiplegia and hemiparesis following cerebral infarction affecting left non-dominant side: Secondary | ICD-10-CM | POA: Diagnosis not present

## 2018-01-09 DIAGNOSIS — I10 Essential (primary) hypertension: Secondary | ICD-10-CM | POA: Diagnosis not present

## 2018-01-09 DIAGNOSIS — E11319 Type 2 diabetes mellitus with unspecified diabetic retinopathy without macular edema: Secondary | ICD-10-CM | POA: Diagnosis not present

## 2018-01-09 DIAGNOSIS — N289 Disorder of kidney and ureter, unspecified: Secondary | ICD-10-CM

## 2018-01-09 DIAGNOSIS — M549 Dorsalgia, unspecified: Secondary | ICD-10-CM | POA: Diagnosis not present

## 2018-01-09 DIAGNOSIS — F329 Major depressive disorder, single episode, unspecified: Secondary | ICD-10-CM | POA: Diagnosis not present

## 2018-01-09 NOTE — Telephone Encounter (Signed)
-----   Message from Lendon Collar, RT sent at 12/30/2017  5:30 PM EDT ----- Regarding: Lab orders for Friday 01/10/18 Please enter lab orders for Friday 01/10/18. Thanks-Lauren

## 2018-01-10 ENCOUNTER — Other Ambulatory Visit (INDEPENDENT_AMBULATORY_CARE_PROVIDER_SITE_OTHER): Payer: Medicare Other

## 2018-01-10 ENCOUNTER — Encounter: Payer: Self-pay | Admitting: *Deleted

## 2018-01-10 DIAGNOSIS — N289 Disorder of kidney and ureter, unspecified: Secondary | ICD-10-CM | POA: Diagnosis not present

## 2018-01-10 LAB — RENAL FUNCTION PANEL
Albumin: 3.5 g/dL (ref 3.5–5.2)
BUN: 18 mg/dL (ref 6–23)
CO2: 28 mEq/L (ref 19–32)
Calcium: 9 mg/dL (ref 8.4–10.5)
Chloride: 107 mEq/L (ref 96–112)
Creatinine, Ser: 1.37 mg/dL — ABNORMAL HIGH (ref 0.40–1.20)
GFR: 38.78 mL/min — ABNORMAL LOW (ref >60.00)
Glucose, Bld: 165 mg/dL — ABNORMAL HIGH (ref 70–99)
Phosphorus: 3.4 mg/dL (ref 2.3–4.6)
Potassium: 4.1 mEq/L (ref 3.5–5.1)
Sodium: 142 mEq/L (ref 135–145)

## 2018-01-13 DIAGNOSIS — I69354 Hemiplegia and hemiparesis following cerebral infarction affecting left non-dominant side: Secondary | ICD-10-CM | POA: Diagnosis not present

## 2018-01-13 DIAGNOSIS — I10 Essential (primary) hypertension: Secondary | ICD-10-CM | POA: Diagnosis not present

## 2018-01-13 DIAGNOSIS — E11319 Type 2 diabetes mellitus with unspecified diabetic retinopathy without macular edema: Secondary | ICD-10-CM | POA: Diagnosis not present

## 2018-01-13 DIAGNOSIS — M549 Dorsalgia, unspecified: Secondary | ICD-10-CM | POA: Diagnosis not present

## 2018-01-13 DIAGNOSIS — F329 Major depressive disorder, single episode, unspecified: Secondary | ICD-10-CM | POA: Diagnosis not present

## 2018-01-13 DIAGNOSIS — F419 Anxiety disorder, unspecified: Secondary | ICD-10-CM | POA: Diagnosis not present

## 2018-01-15 ENCOUNTER — Telehealth: Payer: Self-pay | Admitting: Family Medicine

## 2018-01-15 ENCOUNTER — Ambulatory Visit (INDEPENDENT_AMBULATORY_CARE_PROVIDER_SITE_OTHER): Payer: Medicare Other | Admitting: *Deleted

## 2018-01-15 DIAGNOSIS — M1991 Primary osteoarthritis, unspecified site: Secondary | ICD-10-CM | POA: Diagnosis not present

## 2018-01-15 DIAGNOSIS — F419 Anxiety disorder, unspecified: Secondary | ICD-10-CM | POA: Diagnosis not present

## 2018-01-15 DIAGNOSIS — M8580 Other specified disorders of bone density and structure, unspecified site: Secondary | ICD-10-CM | POA: Diagnosis not present

## 2018-01-15 DIAGNOSIS — F329 Major depressive disorder, single episode, unspecified: Secondary | ICD-10-CM | POA: Diagnosis not present

## 2018-01-15 DIAGNOSIS — R32 Unspecified urinary incontinence: Secondary | ICD-10-CM | POA: Diagnosis not present

## 2018-01-15 DIAGNOSIS — M549 Dorsalgia, unspecified: Secondary | ICD-10-CM | POA: Diagnosis not present

## 2018-01-15 DIAGNOSIS — Z7984 Long term (current) use of oral hypoglycemic drugs: Secondary | ICD-10-CM | POA: Diagnosis not present

## 2018-01-15 DIAGNOSIS — I10 Essential (primary) hypertension: Secondary | ICD-10-CM | POA: Diagnosis not present

## 2018-01-15 DIAGNOSIS — E11319 Type 2 diabetes mellitus with unspecified diabetic retinopathy without macular edema: Secondary | ICD-10-CM | POA: Diagnosis not present

## 2018-01-15 DIAGNOSIS — G4733 Obstructive sleep apnea (adult) (pediatric): Secondary | ICD-10-CM | POA: Diagnosis not present

## 2018-01-15 DIAGNOSIS — E538 Deficiency of other specified B group vitamins: Secondary | ICD-10-CM

## 2018-01-15 DIAGNOSIS — Z7982 Long term (current) use of aspirin: Secondary | ICD-10-CM | POA: Diagnosis not present

## 2018-01-15 MED ORDER — DIPHENOXYLATE-ATROPINE 2.5-0.025 MG PO TABS: 1.0000 | ORAL_TABLET | Freq: Two times a day (BID) | ORAL | 0 refills | Status: DC | PRN

## 2018-01-15 MED ORDER — CYANOCOBALAMIN 1000 MCG/ML IJ SOLN
1000.0000 ug | Freq: Once | INTRAMUSCULAR | Status: AC
  Administered 2018-01-15: 1000 ug via INTRAMUSCULAR

## 2018-01-15 NOTE — Telephone Encounter (Signed)
I sent with directions of bid prn   (do not take more than this)  See her at her appt

## 2018-01-15 NOTE — Telephone Encounter (Signed)
LOMOTIL 2.5-0.025 MG, Directions :Take 1 tablet by mouth 4 times daily prn for diarrhea   Pt said that she is still having intermittent diarrhea it's not as bad as when she was seen last but it is still happening pretty occasionally. I offered pt to come in earlier for an OV to have this rechecked instead of waiting till Friday but pt said due to transportation she needs to keep appt for this Friday

## 2018-01-15 NOTE — Telephone Encounter (Signed)
Not on her medicine list - she was doing better when I saw her last What is it?  How are her symptoms? Worse?

## 2018-01-15 NOTE — Telephone Encounter (Signed)
Pt had b12 inj today and needed to get a refill on diarrhea med  She has an appointment on 5/31 to follow up on this  [address] [employer] number [phone]

## 2018-01-15 NOTE — Telephone Encounter (Signed)
Pt notified Rx sent and advise to only take BID PRN

## 2018-01-15 NOTE — Progress Notes (Signed)
Per orders of Dr. Tower, injection of b12 given by Naome Brigandi H. Patient tolerated injection well.  

## 2018-01-16 DIAGNOSIS — I10 Essential (primary) hypertension: Secondary | ICD-10-CM | POA: Diagnosis not present

## 2018-01-16 DIAGNOSIS — I69354 Hemiplegia and hemiparesis following cerebral infarction affecting left non-dominant side: Secondary | ICD-10-CM | POA: Diagnosis not present

## 2018-01-16 DIAGNOSIS — M549 Dorsalgia, unspecified: Secondary | ICD-10-CM | POA: Diagnosis not present

## 2018-01-16 DIAGNOSIS — F329 Major depressive disorder, single episode, unspecified: Secondary | ICD-10-CM | POA: Diagnosis not present

## 2018-01-16 DIAGNOSIS — F419 Anxiety disorder, unspecified: Secondary | ICD-10-CM | POA: Diagnosis not present

## 2018-01-16 DIAGNOSIS — E11319 Type 2 diabetes mellitus with unspecified diabetic retinopathy without macular edema: Secondary | ICD-10-CM | POA: Diagnosis not present

## 2018-01-17 ENCOUNTER — Ambulatory Visit (INDEPENDENT_AMBULATORY_CARE_PROVIDER_SITE_OTHER): Payer: Medicare Other | Admitting: Family Medicine

## 2018-01-17 ENCOUNTER — Encounter: Payer: Self-pay | Admitting: Family Medicine

## 2018-01-17 VITALS — BP 134/60 | HR 72 | Temp 97.7°F | Ht 61.5 in | Wt 181.0 lb

## 2018-01-17 DIAGNOSIS — F419 Anxiety disorder, unspecified: Secondary | ICD-10-CM | POA: Diagnosis not present

## 2018-01-17 DIAGNOSIS — I69354 Hemiplegia and hemiparesis following cerebral infarction affecting left non-dominant side: Secondary | ICD-10-CM | POA: Diagnosis not present

## 2018-01-17 DIAGNOSIS — Z8719 Personal history of other diseases of the digestive system: Secondary | ICD-10-CM | POA: Diagnosis not present

## 2018-01-17 DIAGNOSIS — I10 Essential (primary) hypertension: Secondary | ICD-10-CM

## 2018-01-17 DIAGNOSIS — N289 Disorder of kidney and ureter, unspecified: Secondary | ICD-10-CM

## 2018-01-17 DIAGNOSIS — M549 Dorsalgia, unspecified: Secondary | ICD-10-CM | POA: Diagnosis not present

## 2018-01-17 DIAGNOSIS — F329 Major depressive disorder, single episode, unspecified: Secondary | ICD-10-CM | POA: Diagnosis not present

## 2018-01-17 DIAGNOSIS — R609 Edema, unspecified: Secondary | ICD-10-CM

## 2018-01-17 DIAGNOSIS — E11319 Type 2 diabetes mellitus with unspecified diabetic retinopathy without macular edema: Secondary | ICD-10-CM | POA: Diagnosis not present

## 2018-01-17 DIAGNOSIS — K644 Residual hemorrhoidal skin tags: Secondary | ICD-10-CM | POA: Diagnosis not present

## 2018-01-17 NOTE — Assessment & Plan Note (Signed)
Followed by nephrology  Lab Results  Component Value Date   CREATININE 1.37 (H) 01/10/2018    Improved with hydration after bout of colitis

## 2018-01-17 NOTE — Assessment & Plan Note (Addendum)
Small-non tender and no pain or bleeding  Will continue to obs Alert if any problems or worse

## 2018-01-17 NOTE — Assessment & Plan Note (Signed)
Now resolved No diarrhea for 2 weeks  Has lomotil for prn use-not using  Enc healthy diet Continue probiotic and yogurt  Strength better with PT

## 2018-01-17 NOTE — Progress Notes (Signed)
Subjective:    Patient ID: Renee [name], female    DOB: 1929/07/22, 84 y.o.   MRN: [id number]  HPI Here for f/u of diarrhea   Also some leg swelling that started on Thursday    Wt Readings from Last 3 Encounters:  01/17/18 181 lb (82.1 kg)  12/27/17 183 lb 12 oz (83.3 kg)  12/18/17 181 lb 12 oz (82.4 kg)   33.65 kg/m   Colitis-no more diarrhea for a few weeks  Energy level is coming back and PT is helping  Has a hard knot she can feel with BM  Is not sore   No constipation  No blood in stool    bp is stable today  No cp or palpitations or headaches or edema  No side effects to medicines  BP Readings from Last 3 Encounters:  01/17/18 134/60  12/27/17 128/66  12/18/17 140/62    Takes amlodipine 10 mg  Hydralazine 100 mg tid  Metoprolol xl  25 mg  In the past hydralazine worsened her edema   Some soreness from swelling  Goes 1/2 way up leg  Wears supp hose occ     Renal insuff worsened with colitis bout and dehydration and then improved Lab Results  Component Value Date   CREATININE 1.37 (H) 01/10/2018   BUN 18 01/10/2018   NA 142 01/10/2018   K 4.1 01/10/2018   CL 107 01/10/2018   CO2 28 01/10/2018    Review of Systems  Constitutional: Negative for activity change, appetite change, fatigue, fever and unexpected weight change.  HENT: Negative for congestion, ear pain, rhinorrhea, sinus pressure and sore throat.   Eyes: Negative for pain, redness and visual disturbance.  Respiratory: Negative for cough, shortness of breath and wheezing.   Cardiovascular: Negative for chest pain and palpitations.  Gastrointestinal: Negative for abdominal distention, abdominal pain, anal bleeding, blood in stool, constipation, diarrhea, nausea, rectal pain and vomiting.  Endocrine: Negative for polydipsia and polyuria.  Genitourinary: Negative for dysuria, frequency and urgency.       Lump in rectal area  Musculoskeletal: Negative for arthralgias, back pain and  myalgias.  Skin: Negative for pallor and rash.  Allergic/Immunologic: Negative for environmental allergies.  Neurological: Positive for weakness. Negative for dizziness, syncope and headaches.  Hematological: Negative for adenopathy. Does not bruise/bleed easily.  Psychiatric/Behavioral: Negative for decreased concentration and dysphoric mood. The patient is not nervous/anxious.        Objective:   Physical Exam  Constitutional: She appears well-developed and well-nourished. No distress.  Well appearing   HENT:  Head: Normocephalic and atraumatic.  Mouth/Throat: Oropharynx is clear and moist.  Eyes: Pupils are equal, round, and reactive to light. Conjunctivae and EOM are normal.  Neck: Normal range of motion. Neck supple. No JVD present. Carotid bruit is not present. No thyromegaly present.  Cardiovascular: Normal rate, regular rhythm, normal heart sounds and intact distal pulses. Exam reveals no gallop.  Pulmonary/Chest: Effort normal and breath sounds normal. No respiratory distress. She has no wheezes. She has no rales.  No crackles  Abdominal: Soft. Bowel sounds are normal. She exhibits no distension, no abdominal bruit and no mass. There is no tenderness. There is no rebound and no guarding. No hernia.  Genitourinary:  Genitourinary Comments: Small external hemorrhoid at 6:00  Not tender or thrombosed   Musculoskeletal: She exhibits edema.  One plus edema in lower legs- mild pitting  Some bumps/no vesicles or oozing  No dermatitis No tenderness  Lymphadenopathy:  She has no cervical adenopathy.  Neurological: She is alert. She has normal reflexes.  Baseline hemiplegia   Skin: Skin is warm and dry. No rash noted. No erythema.  Psychiatric: She has a normal mood and affect.  Pleasant Good mood           Assessment & Plan:   Problem List Items Addressed This Visit      Cardiovascular and Mediastinum   Essential hypertension    bp in fair control at this time  BP  Readings from Last 1 Encounters:  01/17/18 134/60   No changes needed Most recent labs reviewed  Disc lifstyle change with low sodium diet and exercise  Some pedal edema -watching / likely med related       External hemorrhoid    Small-non tender and no pain or bleeding  Will continue to obs Alert if any problems or worse        Genitourinary   Renal insufficiency    Followed by nephrology  Lab Results  Component Value Date   CREATININE 1.37 (H) 01/10/2018    Improved with hydration after bout of colitis         Other   EDEMA - Primary    Pedal edema worse in the warm weather as usual Is on several vaso-dilating medications for HTN  Disc imp of  Sodium avoidance Leg elevation when sitting supp hose to the knee-she will start back now (day time) Can add lasix in the future if needed       History of colitis    Now resolved No diarrhea for 2 weeks  Has lomotil for prn use-not using  Enc healthy diet Continue probiotic and yogurt  Strength better with PT

## 2018-01-17 NOTE — Patient Instructions (Addendum)
Wear your support stockings to the knee- during the day  Elevated legs when you can   Avoid excessive sodium as well   I'm glad your diarrhea is better  Continue the yogurt drink and the probiotics   Continue PT  Take care of yourself   I think you have a small hemorrhoid - if it hurts or bothers you let me know

## 2018-01-17 NOTE — Assessment & Plan Note (Signed)
bp in fair control at this time  BP Readings from Last 1 Encounters:  01/17/18 134/60   No changes needed Most recent labs reviewed  Disc lifstyle change with low sodium diet and exercise  Some pedal edema -watching / likely med related

## 2018-01-17 NOTE — Assessment & Plan Note (Signed)
Pedal edema worse in the warm weather as usual Is on several vaso-dilating medications for HTN  Disc imp of  Sodium avoidance Leg elevation when sitting supp hose to the knee-she will start back now (day time) Can add lasix in the future if needed

## 2018-01-20 DIAGNOSIS — E11319 Type 2 diabetes mellitus with unspecified diabetic retinopathy without macular edema: Secondary | ICD-10-CM | POA: Diagnosis not present

## 2018-01-20 DIAGNOSIS — F419 Anxiety disorder, unspecified: Secondary | ICD-10-CM | POA: Diagnosis not present

## 2018-01-20 DIAGNOSIS — M549 Dorsalgia, unspecified: Secondary | ICD-10-CM | POA: Diagnosis not present

## 2018-01-20 DIAGNOSIS — I69354 Hemiplegia and hemiparesis following cerebral infarction affecting left non-dominant side: Secondary | ICD-10-CM | POA: Diagnosis not present

## 2018-01-20 DIAGNOSIS — I10 Essential (primary) hypertension: Secondary | ICD-10-CM | POA: Diagnosis not present

## 2018-01-20 DIAGNOSIS — F329 Major depressive disorder, single episode, unspecified: Secondary | ICD-10-CM | POA: Diagnosis not present

## 2018-01-21 DIAGNOSIS — E11319 Type 2 diabetes mellitus with unspecified diabetic retinopathy without macular edema: Secondary | ICD-10-CM | POA: Diagnosis not present

## 2018-01-21 DIAGNOSIS — I69354 Hemiplegia and hemiparesis following cerebral infarction affecting left non-dominant side: Secondary | ICD-10-CM | POA: Diagnosis not present

## 2018-01-21 DIAGNOSIS — I10 Essential (primary) hypertension: Secondary | ICD-10-CM | POA: Diagnosis not present

## 2018-01-21 DIAGNOSIS — M549 Dorsalgia, unspecified: Secondary | ICD-10-CM | POA: Diagnosis not present

## 2018-01-21 DIAGNOSIS — F329 Major depressive disorder, single episode, unspecified: Secondary | ICD-10-CM | POA: Diagnosis not present

## 2018-01-21 DIAGNOSIS — F419 Anxiety disorder, unspecified: Secondary | ICD-10-CM | POA: Diagnosis not present

## 2018-01-23 DIAGNOSIS — F329 Major depressive disorder, single episode, unspecified: Secondary | ICD-10-CM | POA: Diagnosis not present

## 2018-01-23 DIAGNOSIS — E11319 Type 2 diabetes mellitus with unspecified diabetic retinopathy without macular edema: Secondary | ICD-10-CM | POA: Diagnosis not present

## 2018-01-23 DIAGNOSIS — M549 Dorsalgia, unspecified: Secondary | ICD-10-CM | POA: Diagnosis not present

## 2018-01-23 DIAGNOSIS — I69354 Hemiplegia and hemiparesis following cerebral infarction affecting left non-dominant side: Secondary | ICD-10-CM | POA: Diagnosis not present

## 2018-01-23 DIAGNOSIS — F419 Anxiety disorder, unspecified: Secondary | ICD-10-CM | POA: Diagnosis not present

## 2018-01-23 DIAGNOSIS — I10 Essential (primary) hypertension: Secondary | ICD-10-CM | POA: Diagnosis not present

## 2018-01-27 DIAGNOSIS — F419 Anxiety disorder, unspecified: Secondary | ICD-10-CM | POA: Diagnosis not present

## 2018-01-27 DIAGNOSIS — M549 Dorsalgia, unspecified: Secondary | ICD-10-CM | POA: Diagnosis not present

## 2018-01-27 DIAGNOSIS — I10 Essential (primary) hypertension: Secondary | ICD-10-CM | POA: Diagnosis not present

## 2018-01-27 DIAGNOSIS — E11319 Type 2 diabetes mellitus with unspecified diabetic retinopathy without macular edema: Secondary | ICD-10-CM | POA: Diagnosis not present

## 2018-01-27 DIAGNOSIS — I69354 Hemiplegia and hemiparesis following cerebral infarction affecting left non-dominant side: Secondary | ICD-10-CM | POA: Diagnosis not present

## 2018-01-27 DIAGNOSIS — F329 Major depressive disorder, single episode, unspecified: Secondary | ICD-10-CM | POA: Diagnosis not present

## 2018-01-29 LAB — HM DIABETES EYE EXAM

## 2018-01-31 DIAGNOSIS — I69354 Hemiplegia and hemiparesis following cerebral infarction affecting left non-dominant side: Secondary | ICD-10-CM | POA: Diagnosis not present

## 2018-01-31 DIAGNOSIS — E11319 Type 2 diabetes mellitus with unspecified diabetic retinopathy without macular edema: Secondary | ICD-10-CM | POA: Diagnosis not present

## 2018-01-31 DIAGNOSIS — F419 Anxiety disorder, unspecified: Secondary | ICD-10-CM | POA: Diagnosis not present

## 2018-01-31 DIAGNOSIS — M549 Dorsalgia, unspecified: Secondary | ICD-10-CM | POA: Diagnosis not present

## 2018-01-31 DIAGNOSIS — F329 Major depressive disorder, single episode, unspecified: Secondary | ICD-10-CM | POA: Diagnosis not present

## 2018-01-31 DIAGNOSIS — I10 Essential (primary) hypertension: Secondary | ICD-10-CM | POA: Diagnosis not present

## 2018-02-03 ENCOUNTER — Ambulatory Visit (INDEPENDENT_AMBULATORY_CARE_PROVIDER_SITE_OTHER): Payer: Medicare Other | Admitting: Ophthalmology

## 2018-02-03 DIAGNOSIS — E113292 Type 2 diabetes mellitus with mild nonproliferative diabetic retinopathy without macular edema, left eye: Secondary | ICD-10-CM

## 2018-02-03 DIAGNOSIS — H338 Other retinal detachments: Secondary | ICD-10-CM | POA: Diagnosis not present

## 2018-02-03 DIAGNOSIS — H43812 Vitreous degeneration, left eye: Secondary | ICD-10-CM

## 2018-02-03 DIAGNOSIS — E11319 Type 2 diabetes mellitus with unspecified diabetic retinopathy without macular edema: Secondary | ICD-10-CM

## 2018-02-03 DIAGNOSIS — H35371 Puckering of macula, right eye: Secondary | ICD-10-CM

## 2018-02-03 DIAGNOSIS — F329 Major depressive disorder, single episode, unspecified: Secondary | ICD-10-CM | POA: Diagnosis not present

## 2018-02-03 DIAGNOSIS — F419 Anxiety disorder, unspecified: Secondary | ICD-10-CM | POA: Diagnosis not present

## 2018-02-03 DIAGNOSIS — E113391 Type 2 diabetes mellitus with moderate nonproliferative diabetic retinopathy without macular edema, right eye: Secondary | ICD-10-CM | POA: Diagnosis not present

## 2018-02-03 DIAGNOSIS — I10 Essential (primary) hypertension: Secondary | ICD-10-CM | POA: Diagnosis not present

## 2018-02-03 DIAGNOSIS — H353122 Nonexudative age-related macular degeneration, left eye, intermediate dry stage: Secondary | ICD-10-CM

## 2018-02-03 DIAGNOSIS — M549 Dorsalgia, unspecified: Secondary | ICD-10-CM | POA: Diagnosis not present

## 2018-02-03 DIAGNOSIS — I69354 Hemiplegia and hemiparesis following cerebral infarction affecting left non-dominant side: Secondary | ICD-10-CM | POA: Diagnosis not present

## 2018-02-05 DIAGNOSIS — F329 Major depressive disorder, single episode, unspecified: Secondary | ICD-10-CM | POA: Diagnosis not present

## 2018-02-05 DIAGNOSIS — M549 Dorsalgia, unspecified: Secondary | ICD-10-CM | POA: Diagnosis not present

## 2018-02-05 DIAGNOSIS — F419 Anxiety disorder, unspecified: Secondary | ICD-10-CM | POA: Diagnosis not present

## 2018-02-05 DIAGNOSIS — I69354 Hemiplegia and hemiparesis following cerebral infarction affecting left non-dominant side: Secondary | ICD-10-CM | POA: Diagnosis not present

## 2018-02-05 DIAGNOSIS — E11319 Type 2 diabetes mellitus with unspecified diabetic retinopathy without macular edema: Secondary | ICD-10-CM | POA: Diagnosis not present

## 2018-02-05 DIAGNOSIS — I10 Essential (primary) hypertension: Secondary | ICD-10-CM | POA: Diagnosis not present

## 2018-02-11 DIAGNOSIS — E11319 Type 2 diabetes mellitus with unspecified diabetic retinopathy without macular edema: Secondary | ICD-10-CM | POA: Diagnosis not present

## 2018-02-11 DIAGNOSIS — M549 Dorsalgia, unspecified: Secondary | ICD-10-CM | POA: Diagnosis not present

## 2018-02-11 DIAGNOSIS — F329 Major depressive disorder, single episode, unspecified: Secondary | ICD-10-CM | POA: Diagnosis not present

## 2018-02-11 DIAGNOSIS — I69354 Hemiplegia and hemiparesis following cerebral infarction affecting left non-dominant side: Secondary | ICD-10-CM | POA: Diagnosis not present

## 2018-02-11 DIAGNOSIS — I10 Essential (primary) hypertension: Secondary | ICD-10-CM | POA: Diagnosis not present

## 2018-02-11 DIAGNOSIS — F419 Anxiety disorder, unspecified: Secondary | ICD-10-CM | POA: Diagnosis not present

## 2018-02-12 DIAGNOSIS — F419 Anxiety disorder, unspecified: Secondary | ICD-10-CM | POA: Diagnosis not present

## 2018-02-12 DIAGNOSIS — F329 Major depressive disorder, single episode, unspecified: Secondary | ICD-10-CM | POA: Diagnosis not present

## 2018-02-12 DIAGNOSIS — I69354 Hemiplegia and hemiparesis following cerebral infarction affecting left non-dominant side: Secondary | ICD-10-CM | POA: Diagnosis not present

## 2018-02-12 DIAGNOSIS — I10 Essential (primary) hypertension: Secondary | ICD-10-CM | POA: Diagnosis not present

## 2018-02-12 DIAGNOSIS — E11319 Type 2 diabetes mellitus with unspecified diabetic retinopathy without macular edema: Secondary | ICD-10-CM | POA: Diagnosis not present

## 2018-02-12 DIAGNOSIS — M549 Dorsalgia, unspecified: Secondary | ICD-10-CM | POA: Diagnosis not present

## 2018-02-17 ENCOUNTER — Other Ambulatory Visit: Payer: Self-pay | Admitting: Family Medicine

## 2018-02-17 DIAGNOSIS — I69354 Hemiplegia and hemiparesis following cerebral infarction affecting left non-dominant side: Secondary | ICD-10-CM | POA: Diagnosis not present

## 2018-02-17 DIAGNOSIS — F329 Major depressive disorder, single episode, unspecified: Secondary | ICD-10-CM | POA: Diagnosis not present

## 2018-02-17 DIAGNOSIS — M549 Dorsalgia, unspecified: Secondary | ICD-10-CM | POA: Diagnosis not present

## 2018-02-17 DIAGNOSIS — I10 Essential (primary) hypertension: Secondary | ICD-10-CM | POA: Diagnosis not present

## 2018-02-17 DIAGNOSIS — E11319 Type 2 diabetes mellitus with unspecified diabetic retinopathy without macular edema: Secondary | ICD-10-CM | POA: Diagnosis not present

## 2018-02-17 DIAGNOSIS — F419 Anxiety disorder, unspecified: Secondary | ICD-10-CM | POA: Diagnosis not present

## 2018-02-18 ENCOUNTER — Other Ambulatory Visit (INDEPENDENT_AMBULATORY_CARE_PROVIDER_SITE_OTHER): Payer: Medicare Other

## 2018-02-18 ENCOUNTER — Ambulatory Visit (INDEPENDENT_AMBULATORY_CARE_PROVIDER_SITE_OTHER): Payer: Medicare Other | Admitting: *Deleted

## 2018-02-18 DIAGNOSIS — E538 Deficiency of other specified B group vitamins: Secondary | ICD-10-CM

## 2018-02-18 LAB — VITAMIN B12: Vitamin B-12: >1500 pg/mL — ABNORMAL HIGH (ref 211–911)

## 2018-02-18 MED ORDER — CYANOCOBALAMIN 1000 MCG/ML IJ SOLN
1000.0000 ug | Freq: Once | INTRAMUSCULAR | Status: AC
  Administered 2018-02-18: 1000 ug via INTRAMUSCULAR

## 2018-02-18 NOTE — Progress Notes (Signed)
Per orders of Dr. Glori Bickers, injection of B12 1000 mcg/ml  given by Laina Guerrieri. Patient tolerated injection well.

## 2018-02-19 ENCOUNTER — Telehealth: Payer: Self-pay | Admitting: *Deleted

## 2018-02-19 DIAGNOSIS — F329 Major depressive disorder, single episode, unspecified: Secondary | ICD-10-CM | POA: Diagnosis not present

## 2018-02-19 DIAGNOSIS — M549 Dorsalgia, unspecified: Secondary | ICD-10-CM | POA: Diagnosis not present

## 2018-02-19 DIAGNOSIS — I10 Essential (primary) hypertension: Secondary | ICD-10-CM | POA: Diagnosis not present

## 2018-02-19 DIAGNOSIS — E11319 Type 2 diabetes mellitus with unspecified diabetic retinopathy without macular edema: Secondary | ICD-10-CM | POA: Diagnosis not present

## 2018-02-19 DIAGNOSIS — I69354 Hemiplegia and hemiparesis following cerebral infarction affecting left non-dominant side: Secondary | ICD-10-CM | POA: Diagnosis not present

## 2018-02-19 DIAGNOSIS — F419 Anxiety disorder, unspecified: Secondary | ICD-10-CM | POA: Diagnosis not present

## 2018-02-19 NOTE — Telephone Encounter (Signed)
-----   Message from Abner Greenspan, MD sent at 02/18/2018  6:22 PM EDT ----- B12 level is high  Tell her we can space injections out to every 3 mo  Also change in med list  Thanks

## 2018-02-19 NOTE — Telephone Encounter (Signed)
Pt notified and nurse visit reschedule for 3 months

## 2018-02-20 ENCOUNTER — Other Ambulatory Visit: Payer: Self-pay | Admitting: Family Medicine

## 2018-02-21 ENCOUNTER — Telehealth: Payer: Self-pay | Admitting: Family Medicine

## 2018-02-21 NOTE — Telephone Encounter (Signed)
I spoke with pt; pt said Renee [name] told her that the B 12 pt is taking orally is 5000 meq daily. Pt wants to know if Dr Glori Bickers wants her to keep taking the oral B 12 since decreasing the B 12 injections to q 3 months. Pt will keep taking oral B 12 until hears back next week.

## 2018-02-21 NOTE — Telephone Encounter (Signed)
Continue the oral B12 and take the shots every 3 months  Thanks

## 2018-02-21 NOTE — Telephone Encounter (Signed)
Copied from [place] [id number]. Topic: Quick Communication - See Telephone Encounter >> Feb 21, 2018 12:55 PM Rutherford Nail, NT wrote: CRM for notification. See Telephone encounter for: 02/21/18. Patient calling and states that she has some questions regarding her B12 results that [place] went over with her. Please advise. CB#: [phone]

## 2018-02-21 NOTE — Telephone Encounter (Signed)
Pt notified to keep taking orally and we changed B12 inj, to every 3 months

## 2018-02-26 ENCOUNTER — Other Ambulatory Visit: Payer: Self-pay | Admitting: Family Medicine

## 2018-02-26 ENCOUNTER — Other Ambulatory Visit: Payer: Self-pay | Admitting: *Deleted

## 2018-02-26 DIAGNOSIS — I69354 Hemiplegia and hemiparesis following cerebral infarction affecting left non-dominant side: Secondary | ICD-10-CM | POA: Diagnosis not present

## 2018-02-26 DIAGNOSIS — F329 Major depressive disorder, single episode, unspecified: Secondary | ICD-10-CM | POA: Diagnosis not present

## 2018-02-26 DIAGNOSIS — M549 Dorsalgia, unspecified: Secondary | ICD-10-CM | POA: Diagnosis not present

## 2018-02-26 DIAGNOSIS — I10 Essential (primary) hypertension: Secondary | ICD-10-CM | POA: Diagnosis not present

## 2018-02-26 DIAGNOSIS — F419 Anxiety disorder, unspecified: Secondary | ICD-10-CM | POA: Diagnosis not present

## 2018-02-26 DIAGNOSIS — E11319 Type 2 diabetes mellitus with unspecified diabetic retinopathy without macular edema: Secondary | ICD-10-CM | POA: Diagnosis not present

## 2018-02-26 MED ORDER — ALPRAZOLAM 0.5 MG PO TABS: ORAL_TABLET | ORAL | 3 refills | Status: DC

## 2018-02-26 NOTE — Telephone Encounter (Signed)
I have already sent a refill request today to PCP, this is a duplicate request

## 2018-02-26 NOTE — Telephone Encounter (Signed)
Copied from [place] [id number]. Topic: Quick Communication - Rx Refill/Question >> Feb 26, 2018 11:20 AM Neva Seat wrote: ALPRAZolam [name]) 0.5 MG tablet  Pt needing refill  [id number] [place], [place] - [address] AT [place] [place] [address] Phone: [phone] Fax: [phone]

## 2018-02-26 NOTE — Telephone Encounter (Signed)
Name of Medication: xanax Name of [facility]acy: [facility] [place] or Written Date and Quantity: 10/07/17 #30 tabs with 3 additional refills Last Office Visit and Type: 01/17/18 follow-up Next Office Visit and Type: 03/25/18 6 month f/u Last Controlled Substance Agreement Date: 09/12/16 low risk Last UDS: 09/12/16 low risk

## 2018-02-26 NOTE — Telephone Encounter (Signed)
Refill of Xanax  LRF 10/07/17  #30  3 refills  LOV 01/17/09 Dr. Glori Bickers  [id number] - [place], [place] - [address] Phone: [phone] Fax: [phone]

## 2018-02-28 DIAGNOSIS — E11319 Type 2 diabetes mellitus with unspecified diabetic retinopathy without macular edema: Secondary | ICD-10-CM | POA: Diagnosis not present

## 2018-02-28 DIAGNOSIS — I10 Essential (primary) hypertension: Secondary | ICD-10-CM | POA: Diagnosis not present

## 2018-02-28 DIAGNOSIS — F419 Anxiety disorder, unspecified: Secondary | ICD-10-CM | POA: Diagnosis not present

## 2018-02-28 DIAGNOSIS — I69354 Hemiplegia and hemiparesis following cerebral infarction affecting left non-dominant side: Secondary | ICD-10-CM | POA: Diagnosis not present

## 2018-02-28 DIAGNOSIS — F329 Major depressive disorder, single episode, unspecified: Secondary | ICD-10-CM | POA: Diagnosis not present

## 2018-02-28 DIAGNOSIS — M549 Dorsalgia, unspecified: Secondary | ICD-10-CM | POA: Diagnosis not present

## 2018-03-09 ENCOUNTER — Telehealth: Payer: Self-pay | Admitting: Family Medicine

## 2018-03-09 DIAGNOSIS — E538 Deficiency of other specified B group vitamins: Secondary | ICD-10-CM

## 2018-03-09 DIAGNOSIS — E7800 Pure hypercholesterolemia, unspecified: Secondary | ICD-10-CM

## 2018-03-09 DIAGNOSIS — N289 Disorder of kidney and ureter, unspecified: Secondary | ICD-10-CM

## 2018-03-09 DIAGNOSIS — I10 Essential (primary) hypertension: Secondary | ICD-10-CM

## 2018-03-09 DIAGNOSIS — E1165 Type 2 diabetes mellitus with hyperglycemia: Secondary | ICD-10-CM

## 2018-03-09 NOTE — Telephone Encounter (Signed)
-----   Message from Eustace Pen, LPN sent at 1/66/0630  4:16 PM EDT ----- Regarding: Labs 7/26 Lab orders needed. Thank you.  Insurance:

## 2018-03-14 ENCOUNTER — Ambulatory Visit (INDEPENDENT_AMBULATORY_CARE_PROVIDER_SITE_OTHER): Payer: Medicare Other

## 2018-03-14 ENCOUNTER — Ambulatory Visit: Payer: Medicare Other

## 2018-03-14 ENCOUNTER — Ambulatory Visit: Payer: Medicare Other | Admitting: Family Medicine

## 2018-03-14 ENCOUNTER — Other Ambulatory Visit: Payer: Self-pay | Admitting: *Deleted

## 2018-03-14 VITALS — BP 142/78 | HR 63 | Temp 97.4°F | Ht 61.0 in | Wt 176.5 lb

## 2018-03-14 DIAGNOSIS — Z0000 Encounter for general adult medical examination without abnormal findings: Secondary | ICD-10-CM | POA: Diagnosis not present

## 2018-03-14 DIAGNOSIS — E538 Deficiency of other specified B group vitamins: Secondary | ICD-10-CM

## 2018-03-14 DIAGNOSIS — E1165 Type 2 diabetes mellitus with hyperglycemia: Secondary | ICD-10-CM

## 2018-03-14 DIAGNOSIS — E7800 Pure hypercholesterolemia, unspecified: Secondary | ICD-10-CM

## 2018-03-14 DIAGNOSIS — I10 Essential (primary) hypertension: Secondary | ICD-10-CM

## 2018-03-14 LAB — COMPREHENSIVE METABOLIC PANEL
ALT: 10 U/L (ref 0–35)
AST: 12 U/L (ref 0–37)
Albumin: 3.7 g/dL (ref 3.5–5.2)
Alkaline Phosphatase: 66 U/L (ref 39–117)
BUN: 29 mg/dL — ABNORMAL HIGH (ref 6–23)
CO2: 25 mEq/L (ref 19–32)
Calcium: 8.5 mg/dL (ref 8.4–10.5)
Chloride: 109 mEq/L (ref 96–112)
Creatinine, Ser: 1.48 mg/dL — ABNORMAL HIGH (ref 0.40–1.20)
GFR: 35.46 mL/min — ABNORMAL LOW (ref >60.00)
Glucose, Bld: 198 mg/dL — ABNORMAL HIGH (ref 70–99)
Potassium: 4.4 mEq/L (ref 3.5–5.1)
Sodium: 143 mEq/L (ref 135–145)
Total Bilirubin: 0.3 mg/dL (ref 0.2–1.2)
Total Protein: 5.6 g/dL — ABNORMAL LOW (ref 6.0–8.3)

## 2018-03-14 LAB — CBC WITH DIFFERENTIAL/PLATELET
Basophils Absolute: 0 10*3/uL (ref 0.0–0.1)
Basophils Relative: 1.1 % (ref 0.0–3.0)
Eosinophils Absolute: 0.3 10*3/uL (ref 0.0–0.7)
Eosinophils Relative: 6 % — ABNORMAL HIGH (ref 0.0–5.0)
HCT: 33.9 % — ABNORMAL LOW (ref 36.0–46.0)
Hemoglobin: 11.1 g/dL — ABNORMAL LOW (ref 12.0–15.0)
Lymphocytes Relative: 21.8 % (ref 12.0–46.0)
Lymphs Abs: 1 10*3/uL (ref 0.7–4.0)
MCHC: 32.8 g/dL (ref 30.0–36.0)
MCV: 86.8 fl (ref 78.0–100.0)
Monocytes Absolute: 0.3 10*3/uL (ref 0.1–1.0)
Monocytes Relative: 7.6 % (ref 3.0–12.0)
Neutro Abs: 2.8 10*3/uL (ref 1.4–7.7)
Neutrophils Relative %: 63.5 % (ref 43.0–77.0)
Platelets: 180 10*3/uL (ref 150.0–400.0)
RBC: 3.91 Mil/uL (ref 3.87–5.11)
RDW: 14.8 % (ref 11.5–15.5)
WBC: 4.5 10*3/uL (ref 4.0–10.5)

## 2018-03-14 LAB — LIPID PANEL
Cholesterol: 159 mg/dL (ref 0–200)
HDL: 36.7 mg/dL — ABNORMAL LOW (ref >39.00)
LDL Cholesterol: 89 mg/dL (ref 0–99)
NonHDL: 122.51
Total CHOL/HDL Ratio: 4
Triglycerides: 167 mg/dL — ABNORMAL HIGH (ref 0.0–149.0)
VLDL: 33.4 mg/dL (ref 0.0–40.0)

## 2018-03-14 LAB — HEMOGLOBIN A1C: Hgb A1c MFr Bld: 5.8 % (ref 4.6–6.5)

## 2018-03-14 LAB — VITAMIN B12: Vitamin B-12: >1500 pg/mL — ABNORMAL HIGH (ref 211–911)

## 2018-03-14 LAB — TSH: TSH: 2.44 u[IU]/mL (ref 0.35–4.50)

## 2018-03-14 MED ORDER — MIRTAZAPINE 15 MG PO TABS: 7.5000 mg | ORAL_TABLET | Freq: Every day | ORAL | 1 refills | Status: DC

## 2018-03-14 NOTE — Patient Instructions (Addendum)
Renee [name] , Thank you for taking time to come for your Medicare Wellness Visit. I appreciate your ongoing commitment to your health goals. Please review the following plan we discussed and let me know if I can assist you in the future.   These are the goals we discussed: Goals    . Increase physical activity     Starting 03/14/2018, I will continue to walk at least 10 minutes daily.        This is a list of the screening recommended for you and due dates:  Health Maintenance  Topic Date Due  . Complete foot exam   03/25/2018*  . Flu Shot  03/20/2018  . Mammogram  07/02/2018  . Hemoglobin A1C  09/14/2018  . Eye exam for diabetics  01/30/2019  . Tetanus Vaccine  04/04/2022  . DEXA scan (bone density measurement)  Completed  . Pneumonia vaccines  Completed  *Topic was postponed. The date shown is not the original due date.   Preventive Care for Adults  A healthy lifestyle and preventive care can promote health and wellness. Preventive health guidelines for adults include the following key practices.  . A routine yearly physical is a good way to check with your health care provider about your health and preventive screening. It is a chance to share any concerns and updates on your health and to receive a thorough exam.  . Visit your dentist for a routine exam and preventive care every 6 months. Brush your teeth twice a day and floss once a day. Good oral hygiene prevents tooth decay and gum disease.  . The frequency of eye exams is based on your age, health, family medical history, use  of contact lenses, and other factors. Follow your health care provider's recommendations for frequency of eye exams.  . Eat a healthy diet. Foods like vegetables, fruits, whole grains, low-fat dairy products, and lean protein foods contain the nutrients you need without too many calories. Decrease your intake of foods high in solid fats, added sugars, and salt. Eat the right amount of calories for you.  Get information about a proper diet from your health care provider, if necessary.  . Regular physical exercise is one of the most important things you can do for your health. Most adults should get at least 150 minutes of moderate-intensity exercise (any activity that increases your heart rate and causes you to sweat) each week. In addition, most adults need muscle-strengthening exercises on 2 or more days a week.  Silver Sneakers may be a benefit available to you. To determine eligibility, you may visit the website: www.silversneakers.com or contact program at 720-863-2841 Mon-Fri between 8AM-8PM.   . Maintain a healthy weight. The body mass index (BMI) is a screening tool to identify possible weight problems. It provides an estimate of body fat based on height and weight. Your health care provider can find your BMI and can help you achieve or maintain a healthy weight.   For adults 20 years and older: ? A BMI below 18.5 is considered underweight. ? A BMI of 18.5 to 24.9 is normal. ? A BMI of 25 to 29.9 is considered overweight. ? A BMI of 30 and above is considered obese.   . Maintain normal blood lipids and cholesterol levels by exercising and minimizing your intake of saturated fat. Eat a balanced diet with plenty of fruit and vegetables. Blood tests for lipids and cholesterol should begin at age 83 and be repeated every 5 years. If your  lipid or cholesterol levels are high, you are over 50, or you are at high risk for heart disease, you may need your cholesterol levels checked more frequently. Ongoing high lipid and cholesterol levels should be treated with medicines if diet and exercise are not working.  . If you smoke, find out from your health care provider how to quit. If you do not use tobacco, please do not start.  . If you choose to drink alcohol, please do not consume more than 2 drinks per day. One drink is considered to be 12 ounces (355 mL) of beer, 5 ounces (148 mL) of wine, or  1.5 ounces (44 mL) of liquor.  . If you are 39-25 years old, ask your health care provider if you should take aspirin to prevent strokes.  . Use sunscreen. Apply sunscreen liberally and repeatedly throughout the day. You should seek shade when your shadow is shorter than you. Protect yourself by wearing long sleeves, pants, a wide-brimmed hat, and sunglasses year round, whenever you are outdoors.  . Once a month, do a whole body skin exam, using a mirror to look at the skin on your back. Tell your health care provider of new moles, moles that have irregular borders, moles that are larger than a pencil eraser, or moles that have changed in shape or color.

## 2018-03-19 NOTE — Progress Notes (Signed)
I reviewed health advisor's note, was available for consultation, and agree with documentation and plan.  

## 2018-03-19 NOTE — Progress Notes (Signed)
PCP notes:   Health maintenance:  Foot exam - PCP please address at next appt A1C - completed  Abnormal screenings:   Fall risk - hx of single fall Fall Risk  03/14/2018 08/31/2016 08/30/2015 04/27/2014 04/19/2013  Falls in the past year? Yes No No No Yes  Number falls in past yr: 1 - - - 1  Injury with Fall? No - - - -  Risk for fall due to : Impaired balance/gait;Impaired mobility - - - Impaired balance/gait  Risk for fall due to: Comment - - - - counseling done at last visit-pt declines PT but has started   Patient concerns:   None  Nurse concerns:  None  Next PCP appt:   03/25/18 @ 1015

## 2018-03-19 NOTE — Progress Notes (Signed)
Subjective:   Renee [name] is a 84 y.o. female who presents for Medicare Annual (Subsequent) preventive examination.  Review of Systems:  N/A Cardiac Risk Factors include: advanced age (>72men, >67 women);obesity (BMI >30kg/m2);diabetes mellitus;dyslipidemia;hypertension     Objective:     Vitals: BP (!) 142/78 (BP Location: Right Arm, Patient Position: Sitting, Cuff Size: Normal)   Pulse 63   Temp (!) 97.4 F (36.3 C) (Oral)   Ht 5\' 1"  (1.549 m) Comment: no shoes  Wt 176 lb 8 oz (80.1 kg)   SpO2 98%   BMI 33.35 kg/m   Body mass index is 33.35 kg/m.  Advanced Directives 03/14/2018 10/01/2017 12/11/2016 12/11/2016 12/05/2016 08/31/2016 10/07/2015  Does Patient Have a Medical Advance Directive? Yes No Yes No No Yes Yes  Type of Paramedic of [place];Living will - Living will - - [place];Living will Living will;Healthcare Power of Attorney  Does patient want to make changes to medical advance directive? - - No - Patient declined - - - -  Copy of [place] in Chart? No - copy requested - - - - No - copy requested -  Would patient like information on creating a medical advance directive? - - No - Patient declined - No - Patient declined - -  Pre-existing out of facility DNR order (yellow form or pink MOST form) - - - - - - -    Tobacco Social History   Tobacco Use  Smoking Status Never Smoker  Smokeless Tobacco Never Used     Counseling given: No   Clinical Intake:  Pre-visit preparation completed: Yes  Pain : No/denies pain Pain Score: 0-No pain     Nutritional Status: BMI > 30  Obese Nutritional Risks: None Diabetes: Yes CBG done?: No Did pt. bring in CBG monitor from home?: No  How often do you need to have someone help you when you read instructions, pamphlets, or other written materials from your doctor or pharmacy?: 1 - Never What is the last grade level you completed in school?: 12th  grade  Interpreter Needed?: No  Comments: pt is a widow and lives alone Information entered by :: LPinson, LPN  Past Medical History:  Diagnosis Date  . Angioedema    possibly from voltaren  . Bronchopneumonia 12/11/2016  . Degenerative disc disease   . Diabetes mellitus    type II  . Hyperlipidemia   . Hypertension   . LVH (left ventricular hypertrophy)    and atrial enlargement by echo in past with nl EF  . Nasal pruritis   . Osteoarthritis   . Osteopenia   . Renal insufficiency   . Sleep apnea   . Stroke [facility]) 05/2010   Small vessel sobcortical (in [place] trial) with Dr Leonie Man, residual L hemiparesis  . Vitamin B 12 deficiency 04/08   Past Surgical History:  Procedure Laterality Date  . ABDOMINAL HYSTERECTOMY     BSO-fibroids  . APPENDECTOMY    . BACK SURGERY    . COLON SURGERY     due to punctured intestines  . EYE SURGERY     cataract extraction  . KNEE SURGERY     arthroscope  . PARS PLANA VITRECTOMY  07/31/2011   Procedure: PARS PLANA VITRECTOMY WITH 25 GAUGE;  Surgeon: [name], MD;  Location: [place];  Service: Ophthalmology;  Laterality: Right;  REMOVAL OF SILICONE OIL AND LASER RIGHT EYE  . RETINAL DETACHMENT SURGERY  02/18/11  times 2  . SPINE SURGERY  08/09   spinal decompression surgery   Family History  Problem Relation Age of Onset  . COPD Brother   . Cancer Sister        brain tumor with hemmorhage  . Heart disease Sister        CAD   Social History   Socioeconomic History  . Marital status: Widowed    Spouse name: Not on file  . Number of children: Not on file  . Years of education: Not on file  . Highest education level: Not on file  Occupational History  . Not on file  Social Needs  . Financial resource strain: Not on file  . Food insecurity:    Worry: Not on file    Inability: Not on file  . Transportation needs:    Medical: Not on file    Non-medical: Not on file  Tobacco Use  . Smoking status: Never Smoker  .  Smokeless tobacco: Never Used  Substance and Sexual Activity  . Alcohol use: No    Alcohol/week: 0.0 oz  . Drug use: No  . Sexual activity: Never  Lifestyle  . Physical activity:    Days per week: Not on file    Minutes per session: Not on file  . Stress: Not on file  Relationships  . Social connections:    Talks on phone: Not on file    Gets together: Not on file    Attends religious service: Not on file    Active member of club or organization: Not on file    Attends meetings of clubs or organizations: Not on file    Relationship status: Not on file  Other Topics Concern  . Not on file  Social History Narrative  . Not on file    Outpatient Encounter Medications as of 03/14/2018  Medication Sig  . ACCU-CHEK AVIVA PLUS test strip USE TO CHECK BLOOD SUGAR ONCE DAILY AND AS DIRECTED. DX E11.9  . acetaminophen (TYLENOL) 500 MG tablet Take 1,000 mg by mouth at bedtime.   Marland Kitchen albuterol (PROVENTIL HFA;VENTOLIN HFA) 108 (90 Base) MCG/ACT inhaler Inhale 2 puffs into the lungs every 4 (four) hours as needed for wheezing.  Marland Kitchen ALPRAZolam (XANAX) 0.5 MG tablet TAKE 1 TABLET BY MOUTH TWICE A DAY AS NEEDED ANXIETY  . amLODipine (NORVASC) 10 MG tablet TAKE 1 TABLET EVERY DAY  . aspirin 325 MG tablet Take 325 mg by mouth daily.   . beta carotene w/minerals (OCUVITE) tablet Take 1 tablet by mouth 2 (two) times daily.   . calcium-vitamin D (OSCAL WITH D) 500-200 MG-UNIT per tablet Take 1 tablet by mouth daily.   . Cholecalciferol (VITAMIN D) 1000 UNITS capsule Take 1,000 Units by mouth daily.   . cyanocobalamin (,VITAMIN B-12,) 1000 MCG/ML injection Inject 1,000 mcg into the muscle every 3 (three) months.  . diphenoxylate-atropine (LOMOTIL) 2.5-0.025 MG tablet Take 1 tablet by mouth 2 (two) times daily as needed for diarrhea or loose stools.  . docusate sodium (COLACE) 100 MG capsule Take 200 mg by mouth at bedtime.  . ferrous sulfate 325 (65 FE) MG tablet Take 325 mg by mouth 2 (two) times daily.   Marland Kitchen gemfibrozil (LOPID) 600 MG tablet TAKE 1 TABLET EVERY DAY  . glipiZIDE (GLUCOTROL XL) 2.5 MG 24 hr tablet Take 1 tablet (2.5 mg total) by mouth daily with breakfast.  . guaiFENesin (ROBITUSSIN) 100 MG/5ML SOLN Take 5 mLs by mouth every 4 (four) hours as needed  for cough or to loosen phlegm.  . hydrALAZINE (APRESOLINE) 100 MG tablet Take 100 mg by mouth 3 (three) times daily.   . Lancets (ACCU-CHEK MULTICLIX) lancets Use to check blood sugar once daily and as directed. Dx E11.9  . metoprolol succinate (TOPROL-XL) 25 MG 24 hr tablet TAKE 1/2 TABLET TWICE DAILY  . Multiple Vitamin (MULTIVITAMIN) capsule Take 1 capsule by mouth daily.   . Polyvinyl Alcohol-Povidone (REFRESH OP) Place 1 drop into both eyes daily as needed (dry eyes).  . sertraline (ZOLOFT) 50 MG tablet TAKE 1 TABLET EVERY DAY  . trandolapril (MAVIK) 4 MG tablet TAKE 1 TABLET EVERY DAY  . vitamin B-12 (CYANOCOBALAMIN) 100 MCG tablet Take 100 mcg by mouth daily.  . [DISCONTINUED] mirtazapine (REMERON) 15 MG tablet TAKE 1/2 TABLET AT BEDTIME   No facility-administered encounter medications on file as of 03/14/2018.     Activities of Daily Living In your present state of health, do you have any difficulty performing the following activities: 03/14/2018  Hearing? Y  Vision? N  Difficulty concentrating or making decisions? N  Walking or climbing stairs? Y  Dressing or bathing? Y  Doing errands, shopping? Y  Preparing Food and eating ? Y  Using the Toilet? N  In the past six months, have you accidently leaked urine? Y  Do you have problems with loss of bowel control? N  Managing your Medications? N  Managing your Finances? N  Housekeeping or managing your Housekeeping? Y  Some recent data might be hidden    Patient Care Team: [place], Wynelle Fanny, MD as PCP - General Dagoberto Ligas, MD as Referring Physician (Internal Medicine) Webb Laws, [place] as Referring Physician (Optometry) Caralee Ates, DDS as Consulting  Physician (Dentistry)    Assessment:   This is a routine wellness examination for Jarika.  Hearing Screening Comments: Bilateral hearing aids Vision Screening Comments: Vision exam in July 2019   Exercise Activities and Dietary recommendations Current Exercise Habits: Home exercise routine, Type of exercise: walking, Time (Minutes): 10, Frequency (Times/Week): 7, Weekly Exercise (Minutes/Week): 70, Intensity: Mild, Exercise limited by: orthopedic condition(s)  Goals    . Increase physical activity     Starting 03/14/2018, I will continue to walk at least 10 minutes daily.        Fall Risk Fall Risk  03/14/2018 08/31/2016 08/30/2015 04/27/2014 04/19/2013  Falls in the past year? Yes No No No Yes  Number falls in past yr: 1 - - - 1  Injury with Fall? No - - - -  Risk for fall due to : Impaired balance/gait;Impaired mobility - - - Impaired balance/gait  Risk for fall due to: Comment - - - - counseling done at last visit-pt declines PT but has started   Depression Screen PHQ 2/9 Scores 03/14/2018 08/31/2016 08/30/2015 04/27/2014  PHQ - 2 Score 0 0 0 0  PHQ- 9 Score 0 - - -     Cognitive Function MMSE - Mini Mental State Exam 03/14/2018 08/31/2016  Orientation to time 5 5  Orientation to Place 5 5  Registration 3 3  Attention/ Calculation 0 0  Recall 3 3  Language- name 2 objects 0 0  Language- repeat 1 1  Language- follow 3 step command 3 3  Language- read & follow direction 0 0  Write a sentence 0 0  Copy design 0 0  Total score 20 20     PLEASE NOTE: A Mini-Cog screen was completed. Maximum score is 20. A value of 0  denotes this part of [place] MMSE was not completed or the patient failed this part of the Mini-Cog screening.   Mini-Cog Screening Orientation to Time - Max 5 pts Orientation to Place - Max 5 pts Registration - Max 3 pts Recall - Max 3 pts Language Repeat - Max 1 pts Language Follow 3 Step Command - Max 3 pts     Immunization History  Administered Date(s)  Administered  . Influenza Split 05/21/2011, 06/11/2012  . Influenza Whole 05/26/2004, 05/23/2010  . Influenza,inj,Quad PF,6+ Mos 04/17/2013, 04/27/2014, 05/20/2015, 05/22/2016, 05/22/2017  . Pneumococcal Conjugate-13 04/27/2014  . Pneumococcal Polysaccharide-23 09/26/2005  . Td 05/21/1999, 04/04/2012    Screening Tests Health Maintenance  Topic Date Due  . FOOT EXAM  03/25/2018 (Originally 09/12/2017)  . INFLUENZA VACCINE  03/20/2018  . MAMMOGRAM  07/02/2018  . HEMOGLOBIN A1C  09/14/2018  . OPHTHALMOLOGY EXAM  01/30/2019  . TETANUS/TDAP  04/04/2022  . DEXA SCAN  Completed  . PNA vac Low Risk Adult  Completed      Plan:     I have personally reviewed, addressed, and noted the following in the patient's chart:  A. Medical and social history B. Use of alcohol, tobacco or illicit drugs  C. Current medications and supplements D. Functional ability and status E.  Nutritional status F.  Physical activity G. Advance directives H. List of other physicians I.  Hospitalizations, surgeries, and ER visits in previous 12 months J.  [place] to include hearing, vision, cognitive, depression L. Referrals and appointments - none  In addition, I have reviewed and discussed with patient certain preventive protocols, quality metrics, and best practice recommendations. A written personalized care plan for preventive services as well as general preventive health recommendations were provided to patient.  See attached scanned questionnaire for additional information.   Signed,   Lindell Noe, MHA, BS, LPN Health Coach

## 2018-03-21 ENCOUNTER — Other Ambulatory Visit: Payer: Self-pay

## 2018-03-24 ENCOUNTER — Ambulatory Visit: Payer: Medicare Other | Admitting: Family Medicine

## 2018-03-24 ENCOUNTER — Other Ambulatory Visit: Payer: Self-pay | Admitting: Family Medicine

## 2018-03-24 DIAGNOSIS — R809 Proteinuria, unspecified: Secondary | ICD-10-CM | POA: Diagnosis not present

## 2018-03-24 DIAGNOSIS — Z0189 Encounter for other specified special examinations: Secondary | ICD-10-CM | POA: Diagnosis not present

## 2018-03-24 DIAGNOSIS — I10 Essential (primary) hypertension: Secondary | ICD-10-CM | POA: Diagnosis not present

## 2018-03-24 DIAGNOSIS — N183 Chronic kidney disease, stage 3 (moderate): Secondary | ICD-10-CM | POA: Diagnosis not present

## 2018-03-24 DIAGNOSIS — R600 Localized edema: Secondary | ICD-10-CM | POA: Diagnosis not present

## 2018-03-24 DIAGNOSIS — N2581 Secondary hyperparathyroidism of renal origin: Secondary | ICD-10-CM | POA: Diagnosis not present

## 2018-03-24 NOTE — Telephone Encounter (Signed)
These requests have been coming straight to me since Friday (even through the weekend)  Any idea why? I thought they went through staff first   Can refill for 6 mo

## 2018-03-25 ENCOUNTER — Ambulatory Visit (INDEPENDENT_AMBULATORY_CARE_PROVIDER_SITE_OTHER): Payer: Medicare Other | Admitting: Family Medicine

## 2018-03-25 ENCOUNTER — Ambulatory Visit: Payer: Medicare Other

## 2018-03-25 ENCOUNTER — Encounter: Payer: Self-pay | Admitting: Family Medicine

## 2018-03-25 VITALS — BP 138/60 | HR 79 | Temp 98.5°F | Ht 61.0 in | Wt 178.5 lb

## 2018-03-25 DIAGNOSIS — E1165 Type 2 diabetes mellitus with hyperglycemia: Secondary | ICD-10-CM

## 2018-03-25 DIAGNOSIS — E538 Deficiency of other specified B group vitamins: Secondary | ICD-10-CM

## 2018-03-25 DIAGNOSIS — I69352 Hemiplegia and hemiparesis following cerebral infarction affecting left dominant side: Secondary | ICD-10-CM

## 2018-03-25 DIAGNOSIS — N289 Disorder of kidney and ureter, unspecified: Secondary | ICD-10-CM

## 2018-03-25 DIAGNOSIS — E7800 Pure hypercholesterolemia, unspecified: Secondary | ICD-10-CM

## 2018-03-25 DIAGNOSIS — E11319 Type 2 diabetes mellitus with unspecified diabetic retinopathy without macular edema: Secondary | ICD-10-CM

## 2018-03-25 DIAGNOSIS — I10 Essential (primary) hypertension: Secondary | ICD-10-CM

## 2018-03-25 DIAGNOSIS — R531 Weakness: Secondary | ICD-10-CM | POA: Diagnosis not present

## 2018-03-25 NOTE — Assessment & Plan Note (Signed)
Continues regular eye exams  Vision per pt is fair

## 2018-03-25 NOTE — Assessment & Plan Note (Signed)
Baseline L hemiplegia Continues to work on [employer] health PT ordered/ is homebound/ uses walker  Has had great benefit in the past  req [facility] if possible

## 2018-03-25 NOTE — Assessment & Plan Note (Signed)
Very well controlled Lab Results  Component Value Date   HGBA1C 5.8 03/14/2018   Glipizide No hypoglycemia Good eye (retinop) and foot care  F/u 6 mo

## 2018-03-25 NOTE — Assessment & Plan Note (Signed)
Ongoing since CVA Walks with walker  Home bound  [place] PT re ordered-has been helpful in the past

## 2018-03-25 NOTE — Patient Instructions (Addendum)
Continue oral B12 supplement  We will stop the shots because level is high - we will keep an eye on that   No changes in medicines  Take care of yourself  Stay as active as you can be   I will work on the PT order for [facility] and get Rosaria Ferries to call you

## 2018-03-25 NOTE — Assessment & Plan Note (Signed)
Disc goals for lipids and reasons to control them Rev last labs with pt Rev low sat fat diet in detail Stable Gemfibrozil controls triglycerides Not adding statin due to above LDL in 80s

## 2018-03-25 NOTE — Assessment & Plan Note (Signed)
bp in fair control at this time  BP Readings from Last 1 Encounters:  03/25/18 138/60   No changes needed Most recent labs reviewed  Disc lifstyle change with low sodium diet and exercise  Continue nephrology f/u also

## 2018-03-25 NOTE — Telephone Encounter (Signed)
Rx sent and will look into Dr. Marliss Coots setting to see why she is getting Rxs

## 2018-03-25 NOTE — Assessment & Plan Note (Signed)
Level is high with inj Q3 mo and oral supp Will hold injections and continue the oral supp F/u 6 mo

## 2018-03-25 NOTE — Progress Notes (Signed)
Subjective:    Patient ID: Renee [name], female    DOB: Sep 13, 1928, 84 y.o.   MRN: [id number]  HPI Here for f/u of chronic medical problems  Doing about the same   Wt Readings from Last 3 Encounters:  03/25/18 178 lb 8 oz (81 kg)  03/14/18 176 lb 8 oz (80.1 kg)  01/17/18 181 lb (82.1 kg)  stable  Eating regularly  33.73 kg/m   bp is up on first check (just took her bp med minutes ago however)  No cp or palpitations or headaches or edema  No side effects to medicines  BP Readings from Last 3 Encounters:  03/25/18 (!) 154/70  03/14/18 (!) 142/78  01/17/18 134/60    Amlodipine 10 mg  Hydralazine  100 mg tid  Metoprolol xl 12.5 bid  Trandolapril 4 mg   Pulse Readings from Last 3 Encounters:  03/25/18 79  03/14/18 63  01/17/18 72      DM2 Lab Results  Component Value Date   HGBA1C 5.8 03/14/2018  last 5.9 Stable and well controlled  6/19 DM eye exam with retinopathy  (vision is fair)  No low glucose levels  Good foot care  Takes glipizide   Renal insuff Lab Results  Component Value Date   CREATININE 1.48 (H) 03/14/2018   BUN 29 (H) 03/14/2018   NA 143 03/14/2018   K 4.4 03/14/2018   CL 109 03/14/2018   CO2 25 03/14/2018   Sees nephrology - report stable  bp was 130/60 there  Drinking fluids   Lab Results  Component Value Date   WBC 4.5 03/14/2018   HGB 11.1 (L) 03/14/2018   HCT 33.9 (L) 03/14/2018   MCV 86.8 03/14/2018   PLT 180.0 03/14/2018  stable anemia of chronic dz   B12 def Gets B12 shots every 3 mo  Lab Results  Component Value Date   VITAMINB12 >1500 (H) 03/14/2018   High  Also oral suppl   Hyperlipidemia Lab Results  Component Value Date   CHOL 159 03/14/2018   CHOL 149 09/13/2017   CHOL 172 03/12/2017   Lab Results  Component Value Date   HDL 36.70 (L) 03/14/2018   HDL 37.30 (L) 09/13/2017   HDL 37.00 (L) 03/12/2017   Lab Results  Component Value Date   LDLCALC 89 03/14/2018   LDLCALC 79 09/13/2017   LDLCALC 124  (H) 02/18/2015   Lab Results  Component Value Date   TRIG 167.0 (H) 03/14/2018   TRIG 163.0 (H) 09/13/2017   TRIG 266.0 (H) 03/12/2017   Lab Results  Component Value Date   CHOLHDL 4 03/14/2018   CHOLHDL 4 09/13/2017   CHOLHDL 5 03/12/2017   Lab Results  Component Value Date   LDLDIRECT 98.0 03/12/2017   LDLDIRECT 88.0 08/31/2016   LDLDIRECT 100.0 08/30/2015   On gemfibrozil  Hesitant to add statin  LDL 89  She is walking with walker lately  Had home PT [facility] - visits ran out  Now that has re set itself  L hemiplegia and generalized weakness    Patient Active Problem List   Diagnosis Date Noted  . External hemorrhoid 01/17/2018  . History of colitis 01/17/2018  . Right calf pain 10/07/2017  . Generalized weakness 12/24/2016  . Respiratory distress 12/11/2016  . Tachypnea 12/11/2016  . Diabetic retinopathy ([place]) 12/11/2016  . History of CVA (cerebrovascular accident) 12/11/2016  . Estrogen deficiency 08/30/2015  . Encounter for Medicare annual wellness exam 04/17/2013  . Mobility impaired 06/26/2011  .  History of retinal detachment 01/10/2011  . Sleep apnea 11/28/2010  . Depression with anxiety 08/25/2010  . Hemiplegia, late effect of cerebrovascular disease ([place]) 07/05/2010  . POSTHERPETIC NEURALGIA 11/09/2009  . Renal insufficiency 06/29/2008  . BACK PAIN 01/26/2008  . EDEMA 01/26/2008  . B12 deficiency 01/10/2007  . Diabetes type 2, controlled ([place]) 11/27/2006  . HLD (hyperlipidemia) 11/27/2006  . Essential hypertension 11/27/2006  . FIBROCYSTIC BREAST DISEASE 11/27/2006  . ROSACEA 11/27/2006  . OSTEOARTHRITIS 11/27/2006  . URINARY INCONTINENCE, MIXED 11/27/2006   Past Medical History:  Diagnosis Date  . Angioedema    possibly from voltaren  . Bronchopneumonia 12/11/2016  . Degenerative disc disease   . Diabetes mellitus    type II  . Hyperlipidemia   . Hypertension   . LVH (left ventricular hypertrophy)    and atrial enlargement by echo in  past with nl EF  . Nasal pruritis   . Osteoarthritis   . Osteopenia   . Renal insufficiency   . Sleep apnea   . Stroke [facility]) 05/2010   Small vessel sobcortical (in [place] trial) with Dr Leonie Man, residual L hemiparesis  . Vitamin B 12 deficiency 04/08   Past Surgical History:  Procedure Laterality Date  . ABDOMINAL HYSTERECTOMY     BSO-fibroids  . APPENDECTOMY    . BACK SURGERY    . COLON SURGERY     due to punctured intestines  . EYE SURGERY     cataract extraction  . KNEE SURGERY     arthroscope  . PARS PLANA VITRECTOMY  07/31/2011   Procedure: PARS PLANA VITRECTOMY WITH 25 GAUGE;  Surgeon: [name], MD;  Location: [facility];  Service: Ophthalmology;  Laterality: Right;  REMOVAL OF SILICONE OIL AND LASER RIGHT EYE  . RETINAL DETACHMENT SURGERY  02/18/11   times 2  . SPINE SURGERY  08/09   spinal decompression surgery   Social History   Tobacco Use  . Smoking status: Never Smoker  . Smokeless tobacco: Never Used  Substance Use Topics  . Alcohol use: No    Alcohol/week: 0.0 oz  . Drug use: No   Family History  Problem Relation Age of Onset  . COPD Brother   . Cancer Sister        brain tumor with hemmorhage  . Heart disease Sister        CAD   Allergies  Allergen Reactions  . Diclofenac Sodium Other (See Comments)    REACTION: angioedema  . Pioglitazone Swelling   Current Outpatient Medications on File Prior to Visit  Medication Sig Dispense Refill  . ACCU-CHEK AVIVA PLUS test strip USE TO CHECK BLOOD SUGAR ONCE DAILY AND AS DIRECTED. DX E11.9 100 each 1  . acetaminophen (TYLENOL) 500 MG tablet Take 1,000 mg by mouth at bedtime.     Marland Kitchen albuterol (PROVENTIL HFA;VENTOLIN HFA) 108 (90 Base) MCG/ACT inhaler Inhale 2 puffs into the lungs every 4 (four) hours as needed for wheezing. 1 Inhaler 3  . ALPRAZolam (XANAX) 0.5 MG tablet TAKE 1 TABLET BY MOUTH TWICE A DAY AS NEEDED ANXIETY 30 tablet 3  . amLODipine (NORVASC) 10 MG tablet TAKE 1 TABLET EVERY DAY 90 tablet 1    . aspirin 325 MG tablet Take 325 mg by mouth daily.     . beta carotene w/minerals (OCUVITE) tablet Take 1 tablet by mouth 2 (two) times daily.     . calcium-vitamin D (OSCAL WITH D) 500-200 MG-UNIT per tablet Take 1 tablet by mouth daily.     Marland Kitchen  Cholecalciferol (VITAMIN D) 1000 UNITS capsule Take 1,000 Units by mouth daily.     . diphenoxylate-atropine (LOMOTIL) 2.5-0.025 MG tablet Take 1 tablet by mouth 2 (two) times daily as needed for diarrhea or loose stools. 20 tablet 0  . docusate sodium (COLACE) 100 MG capsule Take 200 mg by mouth at bedtime.    . ferrous sulfate 325 (65 FE) MG tablet Take 325 mg by mouth 2 (two) times daily.    Marland Kitchen gemfibrozil (LOPID) 600 MG tablet TAKE 1 TABLET EVERY DAY 90 tablet 1  . glipiZIDE (GLUCOTROL XL) 2.5 MG 24 hr tablet Take 1 tablet (2.5 mg total) by mouth daily with breakfast. 90 tablet 3  . guaiFENesin (ROBITUSSIN) 100 MG/5ML SOLN Take 5 mLs by mouth every 4 (four) hours as needed for cough or to loosen phlegm.    . hydrALAZINE (APRESOLINE) 100 MG tablet Take 100 mg by mouth 3 (three) times daily.     . Lancets (ACCU-CHEK MULTICLIX) lancets Use to check blood sugar once daily and as directed. Dx E11.9 102 each 1  . metoprolol succinate (TOPROL-XL) 25 MG 24 hr tablet TAKE 1/2 TABLET TWICE DAILY 90 tablet 1  . mirtazapine (REMERON) 15 MG tablet Take 0.5 tablets (7.5 mg total) by mouth at bedtime. 45 tablet 1  . Multiple Vitamin (MULTIVITAMIN) capsule Take 1 capsule by mouth daily.     . Polyvinyl Alcohol-Povidone (REFRESH OP) Place 1 drop into both eyes daily as needed (dry eyes).    . sertraline (ZOLOFT) 50 MG tablet TAKE 1 TABLET EVERY DAY 90 tablet 1  . trandolapril (MAVIK) 4 MG tablet TAKE 1 TABLET EVERY DAY 90 tablet 1  . vitamin B-12 (CYANOCOBALAMIN) 100 MCG tablet Take 100 mcg by mouth daily.     No current facility-administered medications on file prior to visit.     Review of Systems  Constitutional: Negative for activity change, appetite change,  fatigue, fever and unexpected weight change.  HENT: Negative for congestion, ear pain, rhinorrhea, sinus pressure and sore throat.   Eyes: Negative for pain, redness and visual disturbance.  Respiratory: Negative for cough, shortness of breath and wheezing.   Cardiovascular: Negative for chest pain and palpitations.  Gastrointestinal: Negative for abdominal pain, blood in stool, constipation and diarrhea.  Endocrine: Negative for polydipsia and polyuria.  Genitourinary: Negative for dysuria, frequency and urgency.  Musculoskeletal: Negative for arthralgias, back pain and myalgias.  Skin: Negative for pallor and rash.  Allergic/Immunologic: Negative for environmental allergies.  Neurological: Positive for weakness. Negative for dizziness, syncope, numbness and headaches.       Baseline hemiplegia and slow speech  Hematological: Negative for adenopathy. Does not bruise/bleed easily.  Psychiatric/Behavioral: Negative for decreased concentration and dysphoric mood. The patient is not nervous/anxious.        Objective:   Physical Exam  Constitutional: She appears well-developed and well-nourished. No distress.  obese and well appearing   HENT:  Head: Normocephalic and atraumatic.  Mouth/Throat: Oropharynx is clear and moist.  Eyes: Pupils are equal, round, and reactive to light. Conjunctivae and EOM are normal. No scleral icterus.  Neck: Normal range of motion. Neck supple. No JVD present. Carotid bruit is not present. No thyromegaly present.  Cardiovascular: Normal rate, regular rhythm, normal heart sounds and intact distal pulses. Exam reveals no gallop.  Pulmonary/Chest: Effort normal and breath sounds normal. No respiratory distress. She has no wheezes. She has no rales.  No crackles  Abdominal: Soft. Bowel sounds are normal. She exhibits no distension, no  abdominal bruit and no mass. There is no tenderness.  Musculoskeletal: She exhibits no edema.  Lymphadenopathy:    She has no  cervical adenopathy.  Neurological: She is alert. She has normal reflexes. She displays normal reflexes. No cranial nerve deficit.  Baseline L hemiplegia  Able to ambulate with walker and some assistance Baseline speech /slightly slowed   Cannot rise from chair w/o assistance   Skin: Skin is warm and dry. No rash noted.  sks diffusely  Psychiatric: She has a normal mood and affect.  Mood is good           Assessment & Plan:   Problem List Items Addressed This Visit      Cardiovascular and Mediastinum   Essential hypertension    bp in fair control at this time  BP Readings from Last 1 Encounters:  03/25/18 138/60   No changes needed Most recent labs reviewed  Disc lifstyle change with low sodium diet and exercise  Continue nephrology f/u also        Endocrine   Diabetes type 2, controlled ([place])    Very well controlled Lab Results  Component Value Date   HGBA1C 5.8 03/14/2018   Glipizide No hypoglycemia Good eye (retinop) and foot care  F/u 6 mo       Diabetic retinopathy ([place]) - Primary    Continues regular eye exams  Vision per pt is fair         Nervous and Auditory   Hemiplegia, late effect of cerebrovascular disease ([place])    Baseline L hemiplegia Continues to work on [employer] health PT ordered/ is homebound/ uses walker  Has had great benefit in the past  req [facility] if possible       Relevant Orders   Ambulatory referral to [facility]     Genitourinary   Renal insufficiency    Stable  Seeing nephrology Disc fluid intake  bp is ok         Other   B12 deficiency    Level is high with inj Q3 mo and oral supp Will hold injections and continue the oral supp F/u 6 mo        Generalized weakness    Ongoing since CVA Walks with walker  Home bound  [place] PT re ordered-has been helpful in the past       Relevant Orders   Ambulatory referral to [place] (hyperlipidemia)    Disc goals for lipids and reasons to control  them Rev last labs with pt Rev low sat fat diet in detail Stable Gemfibrozil controls triglycerides Not adding statin due to above LDL in 80s

## 2018-03-25 NOTE — Assessment & Plan Note (Signed)
Stable  Seeing nephrology Disc fluid intake  bp is ok

## 2018-03-29 DIAGNOSIS — M519 Unspecified thoracic, thoracolumbar and lumbosacral intervertebral disc disorder: Secondary | ICD-10-CM | POA: Diagnosis not present

## 2018-03-29 DIAGNOSIS — T783XXD Angioneurotic edema, subsequent encounter: Secondary | ICD-10-CM | POA: Diagnosis not present

## 2018-03-29 DIAGNOSIS — I69354 Hemiplegia and hemiparesis following cerebral infarction affecting left non-dominant side: Secondary | ICD-10-CM | POA: Diagnosis not present

## 2018-03-29 DIAGNOSIS — E11319 Type 2 diabetes mellitus with unspecified diabetic retinopathy without macular edema: Secondary | ICD-10-CM | POA: Diagnosis not present

## 2018-03-29 DIAGNOSIS — M1991 Primary osteoarthritis, unspecified site: Secondary | ICD-10-CM | POA: Diagnosis not present

## 2018-03-29 DIAGNOSIS — I119 Hypertensive heart disease without heart failure: Secondary | ICD-10-CM | POA: Diagnosis not present

## 2018-03-31 ENCOUNTER — Telehealth: Payer: Self-pay | Admitting: Family Medicine

## 2018-03-31 DIAGNOSIS — I119 Hypertensive heart disease without heart failure: Secondary | ICD-10-CM | POA: Diagnosis not present

## 2018-03-31 DIAGNOSIS — M519 Unspecified thoracic, thoracolumbar and lumbosacral intervertebral disc disorder: Secondary | ICD-10-CM | POA: Diagnosis not present

## 2018-03-31 DIAGNOSIS — M1991 Primary osteoarthritis, unspecified site: Secondary | ICD-10-CM | POA: Diagnosis not present

## 2018-03-31 DIAGNOSIS — T783XXD Angioneurotic edema, subsequent encounter: Secondary | ICD-10-CM | POA: Diagnosis not present

## 2018-03-31 DIAGNOSIS — I69354 Hemiplegia and hemiparesis following cerebral infarction affecting left non-dominant side: Secondary | ICD-10-CM | POA: Diagnosis not present

## 2018-03-31 DIAGNOSIS — E11319 Type 2 diabetes mellitus with unspecified diabetic retinopathy without macular edema: Secondary | ICD-10-CM | POA: Diagnosis not present

## 2018-03-31 NOTE — Telephone Encounter (Signed)
Left VM giving Renee [name] the verbal orders

## 2018-03-31 NOTE — Telephone Encounter (Signed)
Please ok those verbal orders  

## 2018-03-31 NOTE — Telephone Encounter (Signed)
Copied from [place] [id number]. Topic: Quick Communication - See Telephone Encounter >> Mar 31, 2018  8:59 AM Bea [place]ff, NT wrote: CRM for notification. See Telephone encounter for: 03/31/18. Verdis Frederickson, PT with [facility] calling for verbal orders for home health PT with a frequency of 1 times a week for 1 week and 2 times a week for 6 weeks. CB#: [phone]

## 2018-04-04 DIAGNOSIS — E11319 Type 2 diabetes mellitus with unspecified diabetic retinopathy without macular edema: Secondary | ICD-10-CM | POA: Diagnosis not present

## 2018-04-04 DIAGNOSIS — I119 Hypertensive heart disease without heart failure: Secondary | ICD-10-CM | POA: Diagnosis not present

## 2018-04-04 DIAGNOSIS — T783XXD Angioneurotic edema, subsequent encounter: Secondary | ICD-10-CM | POA: Diagnosis not present

## 2018-04-04 DIAGNOSIS — M1991 Primary osteoarthritis, unspecified site: Secondary | ICD-10-CM | POA: Diagnosis not present

## 2018-04-04 DIAGNOSIS — M519 Unspecified thoracic, thoracolumbar and lumbosacral intervertebral disc disorder: Secondary | ICD-10-CM | POA: Diagnosis not present

## 2018-04-04 DIAGNOSIS — I69354 Hemiplegia and hemiparesis following cerebral infarction affecting left non-dominant side: Secondary | ICD-10-CM | POA: Diagnosis not present

## 2018-04-07 ENCOUNTER — Other Ambulatory Visit: Payer: Self-pay | Admitting: Family Medicine

## 2018-04-08 DIAGNOSIS — T783XXD Angioneurotic edema, subsequent encounter: Secondary | ICD-10-CM | POA: Diagnosis not present

## 2018-04-08 DIAGNOSIS — M519 Unspecified thoracic, thoracolumbar and lumbosacral intervertebral disc disorder: Secondary | ICD-10-CM | POA: Diagnosis not present

## 2018-04-08 DIAGNOSIS — I69354 Hemiplegia and hemiparesis following cerebral infarction affecting left non-dominant side: Secondary | ICD-10-CM | POA: Diagnosis not present

## 2018-04-08 DIAGNOSIS — E11319 Type 2 diabetes mellitus with unspecified diabetic retinopathy without macular edema: Secondary | ICD-10-CM | POA: Diagnosis not present

## 2018-04-08 DIAGNOSIS — M1991 Primary osteoarthritis, unspecified site: Secondary | ICD-10-CM | POA: Diagnosis not present

## 2018-04-08 DIAGNOSIS — I119 Hypertensive heart disease without heart failure: Secondary | ICD-10-CM | POA: Diagnosis not present

## 2018-04-11 DIAGNOSIS — T783XXD Angioneurotic edema, subsequent encounter: Secondary | ICD-10-CM | POA: Diagnosis not present

## 2018-04-11 DIAGNOSIS — M1991 Primary osteoarthritis, unspecified site: Secondary | ICD-10-CM | POA: Diagnosis not present

## 2018-04-11 DIAGNOSIS — I69354 Hemiplegia and hemiparesis following cerebral infarction affecting left non-dominant side: Secondary | ICD-10-CM | POA: Diagnosis not present

## 2018-04-11 DIAGNOSIS — I119 Hypertensive heart disease without heart failure: Secondary | ICD-10-CM | POA: Diagnosis not present

## 2018-04-11 DIAGNOSIS — M519 Unspecified thoracic, thoracolumbar and lumbosacral intervertebral disc disorder: Secondary | ICD-10-CM | POA: Diagnosis not present

## 2018-04-11 DIAGNOSIS — E11319 Type 2 diabetes mellitus with unspecified diabetic retinopathy without macular edema: Secondary | ICD-10-CM | POA: Diagnosis not present

## 2018-04-12 IMAGING — DX DG CHEST 2V
2 series · 2 of 2 positions shown · non-contrast
Comparison: 12/05/2016

CLINICAL DATA: Cough, weakness for 3 days

EXAM:
CHEST  2 VIEW

[x chest ap]
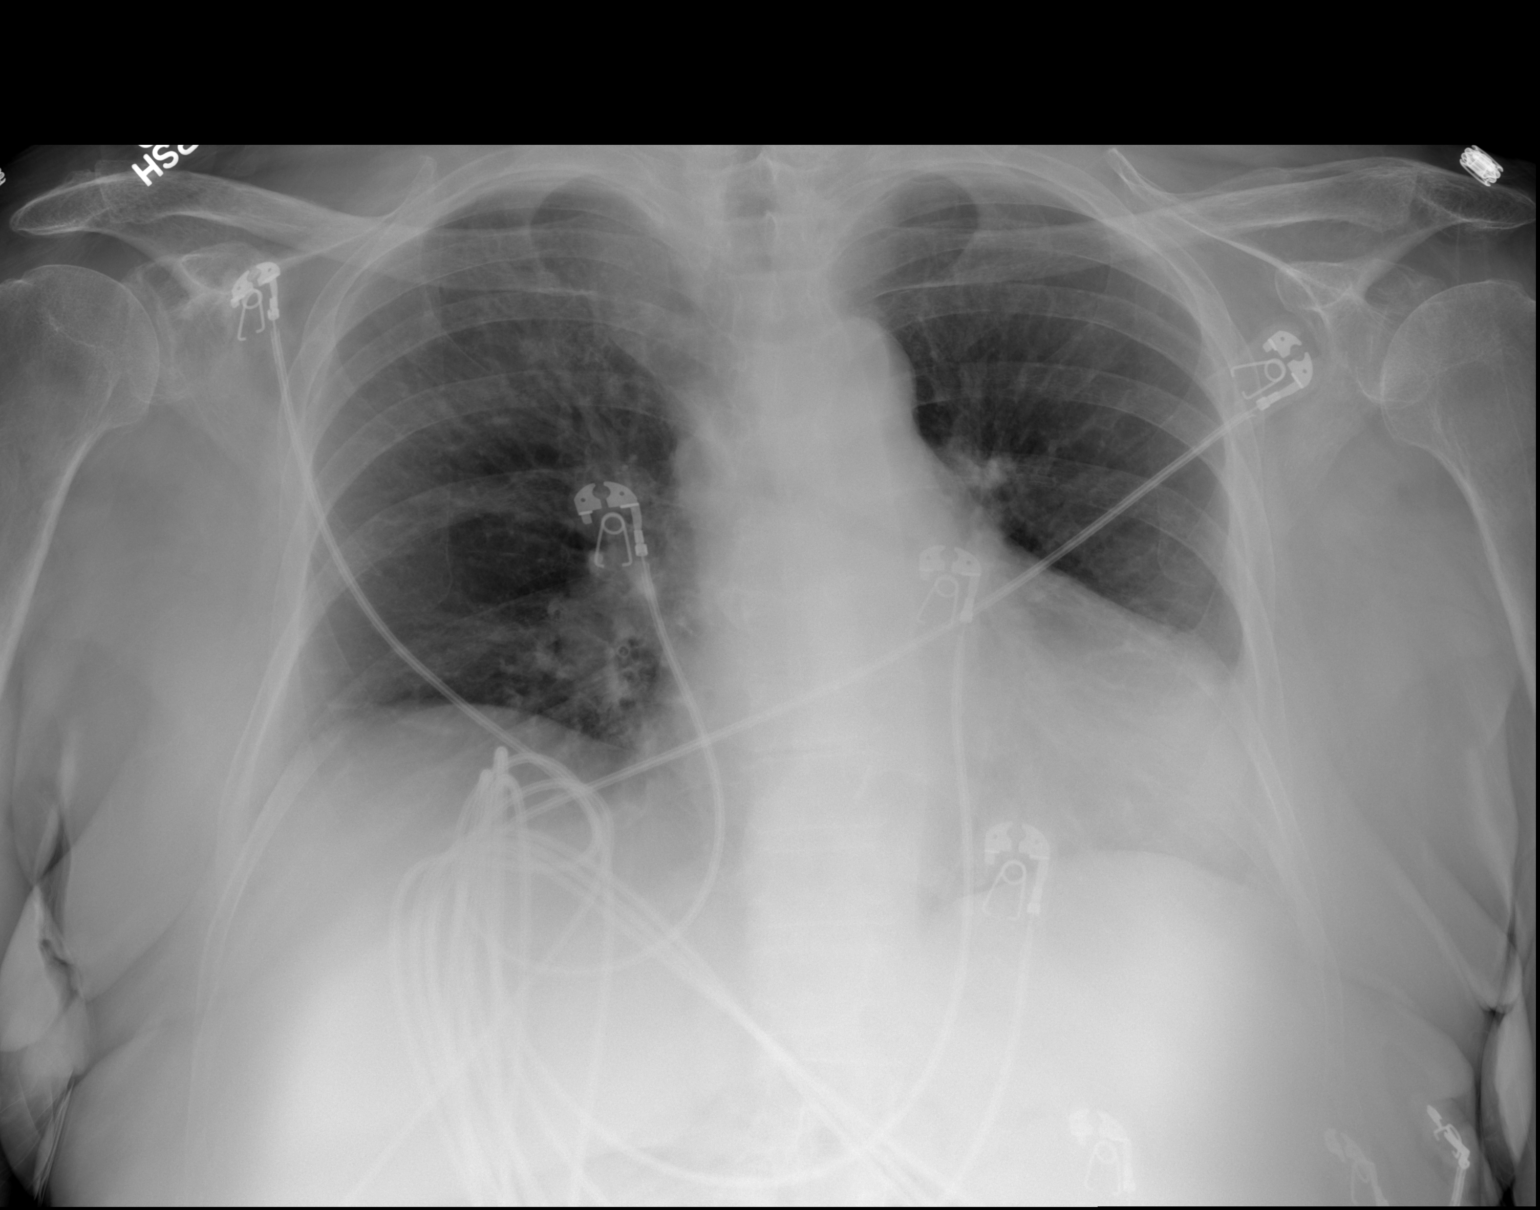

[w chest lat]
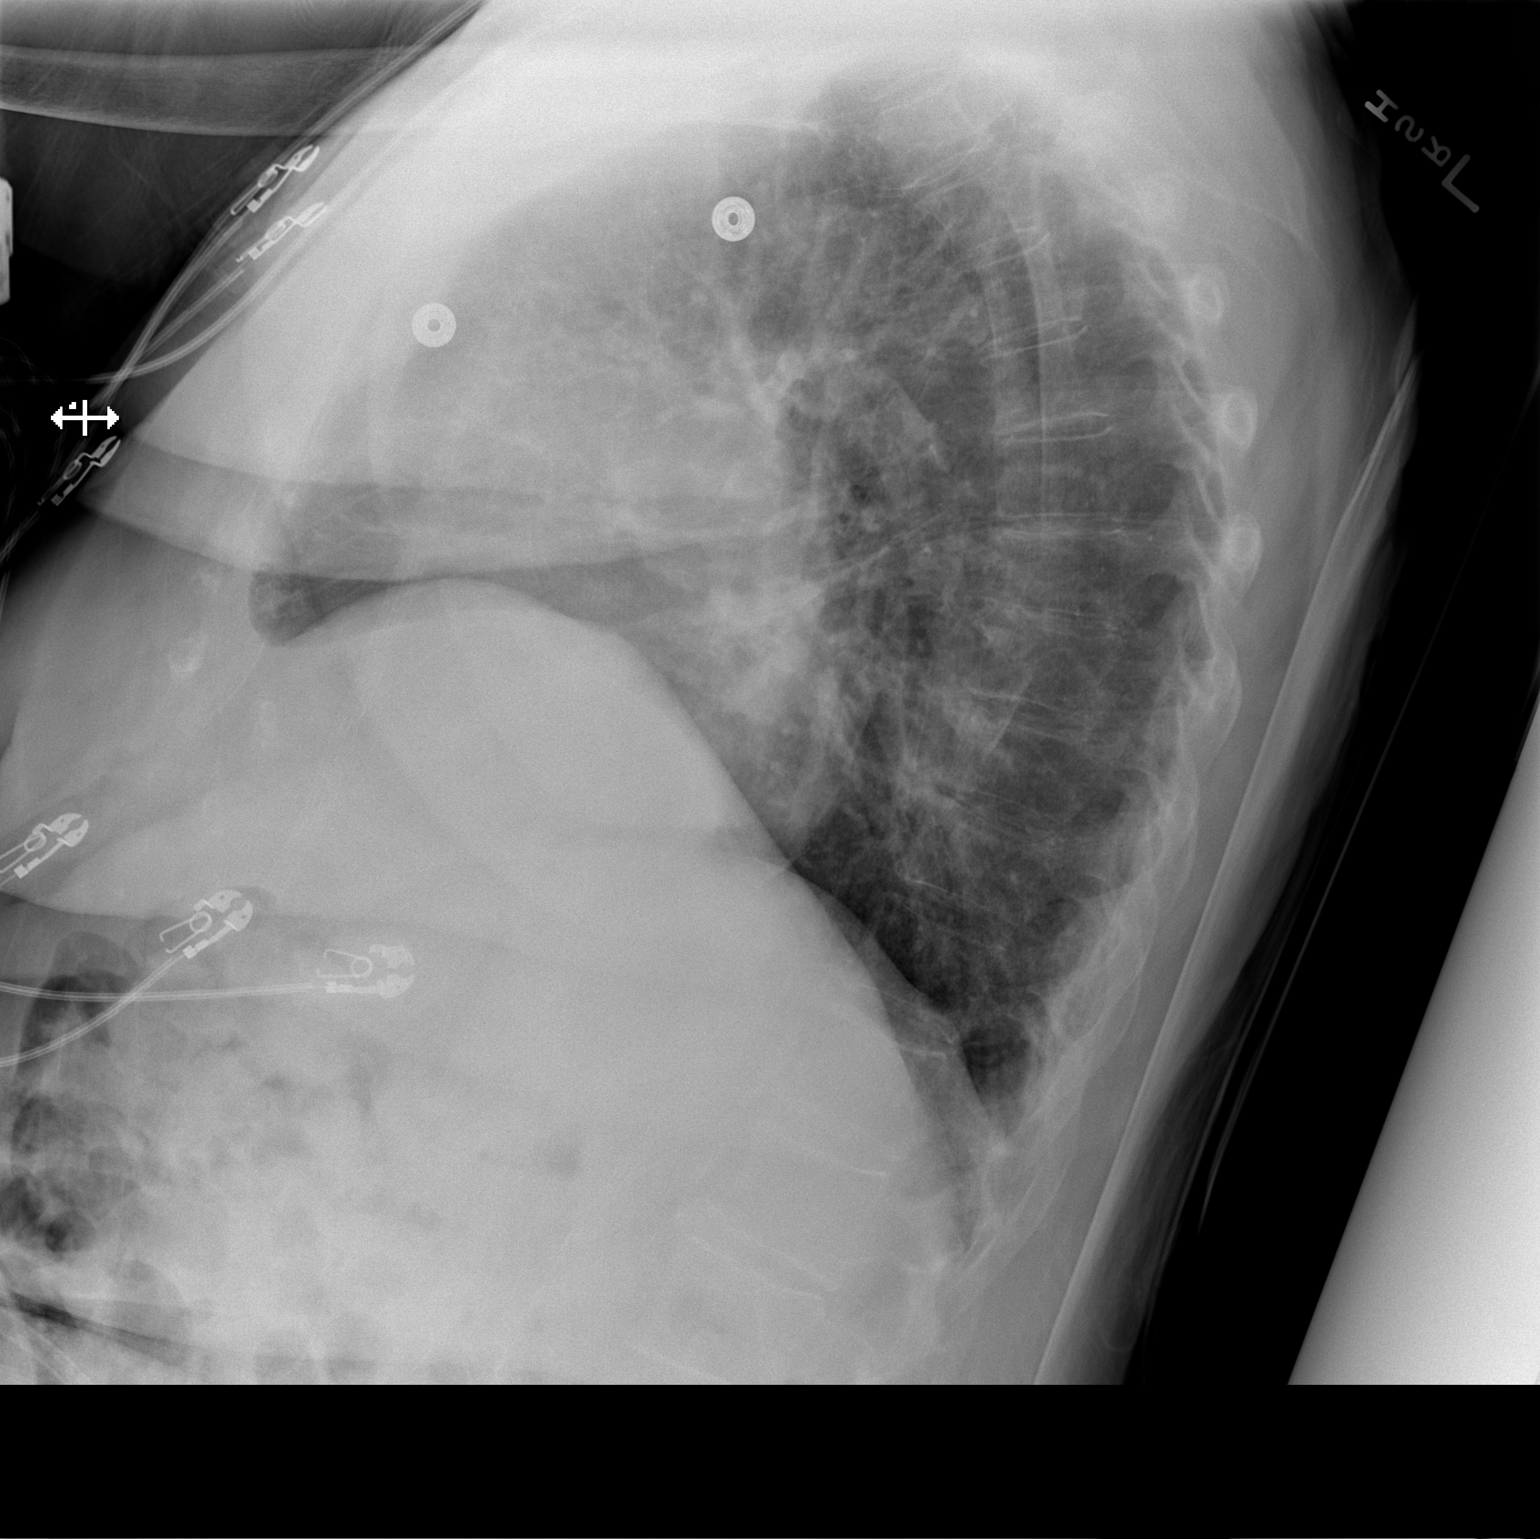

[2 of 2 positions shown; findings below may reference images not displayed]

FINDINGS: Low lung volumes. Mild cardiomegaly. No confluent opacities or
effusions. No acute bony abnormality.
IMPRESSION: Low lung volumes.  No active disease.

## 2018-04-14 DIAGNOSIS — M1991 Primary osteoarthritis, unspecified site: Secondary | ICD-10-CM | POA: Diagnosis not present

## 2018-04-14 DIAGNOSIS — I119 Hypertensive heart disease without heart failure: Secondary | ICD-10-CM | POA: Diagnosis not present

## 2018-04-14 DIAGNOSIS — T783XXD Angioneurotic edema, subsequent encounter: Secondary | ICD-10-CM | POA: Diagnosis not present

## 2018-04-14 DIAGNOSIS — M519 Unspecified thoracic, thoracolumbar and lumbosacral intervertebral disc disorder: Secondary | ICD-10-CM | POA: Diagnosis not present

## 2018-04-14 DIAGNOSIS — I69354 Hemiplegia and hemiparesis following cerebral infarction affecting left non-dominant side: Secondary | ICD-10-CM | POA: Diagnosis not present

## 2018-04-14 DIAGNOSIS — E11319 Type 2 diabetes mellitus with unspecified diabetic retinopathy without macular edema: Secondary | ICD-10-CM | POA: Diagnosis not present

## 2018-04-16 DIAGNOSIS — M519 Unspecified thoracic, thoracolumbar and lumbosacral intervertebral disc disorder: Secondary | ICD-10-CM | POA: Diagnosis not present

## 2018-04-16 DIAGNOSIS — E11319 Type 2 diabetes mellitus with unspecified diabetic retinopathy without macular edema: Secondary | ICD-10-CM | POA: Diagnosis not present

## 2018-04-16 DIAGNOSIS — M1991 Primary osteoarthritis, unspecified site: Secondary | ICD-10-CM | POA: Diagnosis not present

## 2018-04-16 DIAGNOSIS — I119 Hypertensive heart disease without heart failure: Secondary | ICD-10-CM | POA: Diagnosis not present

## 2018-04-16 DIAGNOSIS — I69354 Hemiplegia and hemiparesis following cerebral infarction affecting left non-dominant side: Secondary | ICD-10-CM | POA: Diagnosis not present

## 2018-04-16 DIAGNOSIS — T783XXD Angioneurotic edema, subsequent encounter: Secondary | ICD-10-CM | POA: Diagnosis not present

## 2018-04-23 ENCOUNTER — Other Ambulatory Visit: Payer: Self-pay | Admitting: *Deleted

## 2018-04-23 DIAGNOSIS — E11319 Type 2 diabetes mellitus with unspecified diabetic retinopathy without macular edema: Secondary | ICD-10-CM | POA: Diagnosis not present

## 2018-04-23 DIAGNOSIS — M519 Unspecified thoracic, thoracolumbar and lumbosacral intervertebral disc disorder: Secondary | ICD-10-CM | POA: Diagnosis not present

## 2018-04-23 DIAGNOSIS — M1991 Primary osteoarthritis, unspecified site: Secondary | ICD-10-CM | POA: Diagnosis not present

## 2018-04-23 DIAGNOSIS — I119 Hypertensive heart disease without heart failure: Secondary | ICD-10-CM | POA: Diagnosis not present

## 2018-04-23 DIAGNOSIS — I69354 Hemiplegia and hemiparesis following cerebral infarction affecting left non-dominant side: Secondary | ICD-10-CM | POA: Diagnosis not present

## 2018-04-23 DIAGNOSIS — T783XXD Angioneurotic edema, subsequent encounter: Secondary | ICD-10-CM | POA: Diagnosis not present

## 2018-04-23 MED ORDER — GLUCOSE BLOOD VI STRP: ORAL_STRIP | 1 refills | Status: DC

## 2018-04-24 DIAGNOSIS — M519 Unspecified thoracic, thoracolumbar and lumbosacral intervertebral disc disorder: Secondary | ICD-10-CM | POA: Diagnosis not present

## 2018-04-24 DIAGNOSIS — I69354 Hemiplegia and hemiparesis following cerebral infarction affecting left non-dominant side: Secondary | ICD-10-CM | POA: Diagnosis not present

## 2018-04-24 DIAGNOSIS — I119 Hypertensive heart disease without heart failure: Secondary | ICD-10-CM | POA: Diagnosis not present

## 2018-04-24 DIAGNOSIS — E11319 Type 2 diabetes mellitus with unspecified diabetic retinopathy without macular edema: Secondary | ICD-10-CM | POA: Diagnosis not present

## 2018-04-24 DIAGNOSIS — M1991 Primary osteoarthritis, unspecified site: Secondary | ICD-10-CM | POA: Diagnosis not present

## 2018-04-24 DIAGNOSIS — T783XXD Angioneurotic edema, subsequent encounter: Secondary | ICD-10-CM | POA: Diagnosis not present

## 2018-04-29 DIAGNOSIS — I119 Hypertensive heart disease without heart failure: Secondary | ICD-10-CM | POA: Diagnosis not present

## 2018-04-29 DIAGNOSIS — T783XXD Angioneurotic edema, subsequent encounter: Secondary | ICD-10-CM | POA: Diagnosis not present

## 2018-04-29 DIAGNOSIS — I69354 Hemiplegia and hemiparesis following cerebral infarction affecting left non-dominant side: Secondary | ICD-10-CM | POA: Diagnosis not present

## 2018-04-29 DIAGNOSIS — M1991 Primary osteoarthritis, unspecified site: Secondary | ICD-10-CM | POA: Diagnosis not present

## 2018-04-29 DIAGNOSIS — E11319 Type 2 diabetes mellitus with unspecified diabetic retinopathy without macular edema: Secondary | ICD-10-CM | POA: Diagnosis not present

## 2018-04-29 DIAGNOSIS — M519 Unspecified thoracic, thoracolumbar and lumbosacral intervertebral disc disorder: Secondary | ICD-10-CM | POA: Diagnosis not present

## 2018-05-02 DIAGNOSIS — E11319 Type 2 diabetes mellitus with unspecified diabetic retinopathy without macular edema: Secondary | ICD-10-CM | POA: Diagnosis not present

## 2018-05-02 DIAGNOSIS — I119 Hypertensive heart disease without heart failure: Secondary | ICD-10-CM | POA: Diagnosis not present

## 2018-05-02 DIAGNOSIS — G4730 Sleep apnea, unspecified: Secondary | ICD-10-CM | POA: Diagnosis not present

## 2018-05-02 DIAGNOSIS — N3946 Mixed incontinence: Secondary | ICD-10-CM | POA: Diagnosis not present

## 2018-05-02 DIAGNOSIS — I69354 Hemiplegia and hemiparesis following cerebral infarction affecting left non-dominant side: Secondary | ICD-10-CM | POA: Diagnosis not present

## 2018-05-02 DIAGNOSIS — T783XXD Angioneurotic edema, subsequent encounter: Secondary | ICD-10-CM | POA: Diagnosis not present

## 2018-05-02 DIAGNOSIS — M8580 Other specified disorders of bone density and structure, unspecified site: Secondary | ICD-10-CM | POA: Diagnosis not present

## 2018-05-02 DIAGNOSIS — M1991 Primary osteoarthritis, unspecified site: Secondary | ICD-10-CM | POA: Diagnosis not present

## 2018-05-02 DIAGNOSIS — M519 Unspecified thoracic, thoracolumbar and lumbosacral intervertebral disc disorder: Secondary | ICD-10-CM | POA: Diagnosis not present

## 2018-05-05 DIAGNOSIS — M1991 Primary osteoarthritis, unspecified site: Secondary | ICD-10-CM | POA: Diagnosis not present

## 2018-05-05 DIAGNOSIS — T783XXD Angioneurotic edema, subsequent encounter: Secondary | ICD-10-CM | POA: Diagnosis not present

## 2018-05-05 DIAGNOSIS — E11319 Type 2 diabetes mellitus with unspecified diabetic retinopathy without macular edema: Secondary | ICD-10-CM | POA: Diagnosis not present

## 2018-05-05 DIAGNOSIS — I119 Hypertensive heart disease without heart failure: Secondary | ICD-10-CM | POA: Diagnosis not present

## 2018-05-05 DIAGNOSIS — M519 Unspecified thoracic, thoracolumbar and lumbosacral intervertebral disc disorder: Secondary | ICD-10-CM | POA: Diagnosis not present

## 2018-05-05 DIAGNOSIS — I69354 Hemiplegia and hemiparesis following cerebral infarction affecting left non-dominant side: Secondary | ICD-10-CM | POA: Diagnosis not present

## 2018-05-07 ENCOUNTER — Other Ambulatory Visit: Payer: Self-pay | Admitting: Family Medicine

## 2018-05-07 DIAGNOSIS — T783XXD Angioneurotic edema, subsequent encounter: Secondary | ICD-10-CM | POA: Diagnosis not present

## 2018-05-07 DIAGNOSIS — I119 Hypertensive heart disease without heart failure: Secondary | ICD-10-CM | POA: Diagnosis not present

## 2018-05-07 DIAGNOSIS — I69354 Hemiplegia and hemiparesis following cerebral infarction affecting left non-dominant side: Secondary | ICD-10-CM | POA: Diagnosis not present

## 2018-05-07 DIAGNOSIS — M519 Unspecified thoracic, thoracolumbar and lumbosacral intervertebral disc disorder: Secondary | ICD-10-CM | POA: Diagnosis not present

## 2018-05-07 DIAGNOSIS — E11319 Type 2 diabetes mellitus with unspecified diabetic retinopathy without macular edema: Secondary | ICD-10-CM | POA: Diagnosis not present

## 2018-05-07 DIAGNOSIS — M1991 Primary osteoarthritis, unspecified site: Secondary | ICD-10-CM | POA: Diagnosis not present

## 2018-05-09 ENCOUNTER — Telehealth: Payer: Self-pay | Admitting: Family Medicine

## 2018-05-09 NOTE — Telephone Encounter (Signed)
Please ok those verbal orders  

## 2018-05-09 NOTE — Telephone Encounter (Signed)
Copied from [place] [id number]. Topic: General - Other >> May 09, 2018 11:00 AM Lennox Solders wrote: Reason for CRM: [facility] is calling and would like verbal orders for twice a wk for 2 wks

## 2018-05-09 NOTE — Telephone Encounter (Signed)
Verbal order given to Tiffany  °

## 2018-05-12 DIAGNOSIS — I119 Hypertensive heart disease without heart failure: Secondary | ICD-10-CM | POA: Diagnosis not present

## 2018-05-12 DIAGNOSIS — M1991 Primary osteoarthritis, unspecified site: Secondary | ICD-10-CM | POA: Diagnosis not present

## 2018-05-12 DIAGNOSIS — M519 Unspecified thoracic, thoracolumbar and lumbosacral intervertebral disc disorder: Secondary | ICD-10-CM | POA: Diagnosis not present

## 2018-05-12 DIAGNOSIS — E11319 Type 2 diabetes mellitus with unspecified diabetic retinopathy without macular edema: Secondary | ICD-10-CM | POA: Diagnosis not present

## 2018-05-12 DIAGNOSIS — T783XXD Angioneurotic edema, subsequent encounter: Secondary | ICD-10-CM | POA: Diagnosis not present

## 2018-05-12 DIAGNOSIS — I69354 Hemiplegia and hemiparesis following cerebral infarction affecting left non-dominant side: Secondary | ICD-10-CM | POA: Diagnosis not present

## 2018-05-13 DIAGNOSIS — T783XXD Angioneurotic edema, subsequent encounter: Secondary | ICD-10-CM | POA: Diagnosis not present

## 2018-05-13 DIAGNOSIS — M1991 Primary osteoarthritis, unspecified site: Secondary | ICD-10-CM | POA: Diagnosis not present

## 2018-05-13 DIAGNOSIS — I69354 Hemiplegia and hemiparesis following cerebral infarction affecting left non-dominant side: Secondary | ICD-10-CM | POA: Diagnosis not present

## 2018-05-13 DIAGNOSIS — I119 Hypertensive heart disease without heart failure: Secondary | ICD-10-CM | POA: Diagnosis not present

## 2018-05-13 DIAGNOSIS — M519 Unspecified thoracic, thoracolumbar and lumbosacral intervertebral disc disorder: Secondary | ICD-10-CM | POA: Diagnosis not present

## 2018-05-13 DIAGNOSIS — E11319 Type 2 diabetes mellitus with unspecified diabetic retinopathy without macular edema: Secondary | ICD-10-CM | POA: Diagnosis not present

## 2018-05-19 DIAGNOSIS — M519 Unspecified thoracic, thoracolumbar and lumbosacral intervertebral disc disorder: Secondary | ICD-10-CM | POA: Diagnosis not present

## 2018-05-19 DIAGNOSIS — I119 Hypertensive heart disease without heart failure: Secondary | ICD-10-CM | POA: Diagnosis not present

## 2018-05-19 DIAGNOSIS — T783XXD Angioneurotic edema, subsequent encounter: Secondary | ICD-10-CM | POA: Diagnosis not present

## 2018-05-19 DIAGNOSIS — M1991 Primary osteoarthritis, unspecified site: Secondary | ICD-10-CM | POA: Diagnosis not present

## 2018-05-19 DIAGNOSIS — E11319 Type 2 diabetes mellitus with unspecified diabetic retinopathy without macular edema: Secondary | ICD-10-CM | POA: Diagnosis not present

## 2018-05-19 DIAGNOSIS — I69354 Hemiplegia and hemiparesis following cerebral infarction affecting left non-dominant side: Secondary | ICD-10-CM | POA: Diagnosis not present

## 2018-05-21 ENCOUNTER — Ambulatory Visit: Payer: Medicare Other

## 2018-05-21 DIAGNOSIS — I119 Hypertensive heart disease without heart failure: Secondary | ICD-10-CM | POA: Diagnosis not present

## 2018-05-21 DIAGNOSIS — I69354 Hemiplegia and hemiparesis following cerebral infarction affecting left non-dominant side: Secondary | ICD-10-CM | POA: Diagnosis not present

## 2018-05-21 DIAGNOSIS — M1991 Primary osteoarthritis, unspecified site: Secondary | ICD-10-CM | POA: Diagnosis not present

## 2018-05-21 DIAGNOSIS — M519 Unspecified thoracic, thoracolumbar and lumbosacral intervertebral disc disorder: Secondary | ICD-10-CM | POA: Diagnosis not present

## 2018-05-21 DIAGNOSIS — E11319 Type 2 diabetes mellitus with unspecified diabetic retinopathy without macular edema: Secondary | ICD-10-CM | POA: Diagnosis not present

## 2018-05-21 DIAGNOSIS — T783XXD Angioneurotic edema, subsequent encounter: Secondary | ICD-10-CM | POA: Diagnosis not present

## 2018-05-22 ENCOUNTER — Telehealth: Payer: Self-pay | Admitting: Family Medicine

## 2018-05-22 ENCOUNTER — Ambulatory Visit (INDEPENDENT_AMBULATORY_CARE_PROVIDER_SITE_OTHER): Payer: Medicare Other | Admitting: *Deleted

## 2018-05-22 DIAGNOSIS — Z23 Encounter for immunization: Secondary | ICD-10-CM | POA: Diagnosis not present

## 2018-05-22 MED ORDER — ALPRAZOLAM 0.5 MG PO TABS: ORAL_TABLET | ORAL | 3 refills | Status: DC

## 2018-05-22 NOTE — Telephone Encounter (Signed)
Patient wanted to ask if you could increase the dosage of her Xanax. Patient stated that she is taking 1.5 pills a day. She requests the medication to be sent to [facility] on [address].  Please Advise

## 2018-07-14 DIAGNOSIS — R600 Localized edema: Secondary | ICD-10-CM | POA: Diagnosis not present

## 2018-07-14 DIAGNOSIS — N2581 Secondary hyperparathyroidism of renal origin: Secondary | ICD-10-CM | POA: Diagnosis not present

## 2018-07-14 DIAGNOSIS — N183 Chronic kidney disease, stage 3 (moderate): Secondary | ICD-10-CM | POA: Diagnosis not present

## 2018-07-14 DIAGNOSIS — R809 Proteinuria, unspecified: Secondary | ICD-10-CM | POA: Diagnosis not present

## 2018-07-14 DIAGNOSIS — I10 Essential (primary) hypertension: Secondary | ICD-10-CM | POA: Diagnosis not present

## 2018-07-16 ENCOUNTER — Encounter: Payer: Self-pay | Admitting: Family Medicine

## 2018-07-16 DIAGNOSIS — Z1231 Encounter for screening mammogram for malignant neoplasm of breast: Secondary | ICD-10-CM | POA: Diagnosis not present

## 2018-07-21 ENCOUNTER — Telehealth: Payer: Self-pay | Admitting: *Deleted

## 2018-07-21 MED ORDER — ALPRAZOLAM 1 MG PO TABS: 0.5000 mg | ORAL_TABLET | Freq: Two times a day (BID) | ORAL | 0 refills | Status: DC | PRN

## 2018-07-21 NOTE — Telephone Encounter (Signed)
Spoke to pt who states she was advised by the pharmacy that her xanax 0.5 is on national backorder. Pt is requesting xanax 1mg  be sent to pharmacy for her to take 1/2 tab daily. pls advise

## 2018-07-21 NOTE — Telephone Encounter (Signed)
I sent it to [facility] since that is were last xanax went

## 2018-07-31 ENCOUNTER — Telehealth: Payer: Self-pay | Admitting: Family Medicine

## 2018-07-31 NOTE — Telephone Encounter (Signed)
Left message asking pt to call office please r/s 2/7 appointment with dr tower

## 2018-08-25 ENCOUNTER — Other Ambulatory Visit: Payer: Self-pay | Admitting: *Deleted

## 2018-08-25 MED ORDER — ALPRAZOLAM 1 MG PO TABS: 0.5000 mg | ORAL_TABLET | Freq: Two times a day (BID) | ORAL | 0 refills | Status: DC | PRN

## 2018-08-25 MED ORDER — GEMFIBROZIL 600 MG PO TABS: 600.0000 mg | ORAL_TABLET | Freq: Every day | ORAL | 1 refills | Status: DC

## 2018-08-25 NOTE — Telephone Encounter (Signed)
Spoke to pt who states her insurance changed with the new year and she is no longer using mail order. Requesting med refill be sent to [facility]; sent as requested.

## 2018-08-25 NOTE — Telephone Encounter (Signed)
Name of Medication: Xanax Name of Pharmacy: [facility] or Written Date and Quantity: 07/21/18 #30 tabs with 0 refill Last Office Visit and Type: 03/25/18 Follow-up Next Office Visit and Type: 09/29/18 Follow-up

## 2018-08-27 ENCOUNTER — Telehealth: Payer: Self-pay

## 2018-08-27 DIAGNOSIS — R69 Illness, unspecified: Secondary | ICD-10-CM | POA: Diagnosis not present

## 2018-08-27 MED ORDER — ONETOUCH ULTRA 2 W/DEVICE KIT: PACK | 0 refills | Status: DC

## 2018-08-27 MED ORDER — ONETOUCH DELICA LANCETS 30G MISC: 1.0000 | Freq: Every day | 1 refills | Status: DC

## 2018-08-27 MED ORDER — GLUCOSE BLOOD VI STRP: ORAL_STRIP | 1 refills | Status: DC

## 2018-08-27 NOTE — Telephone Encounter (Signed)
Pt said could not get test strips refilled; spoke with [facility] and ins no longer covers accu chek but will cover One touch. onetouch meter, strips and lancets sent to [facility]. Pt voiced understanding.

## 2018-08-27 NOTE — Telephone Encounter (Signed)
Please send in whatever she needs  For strips and lancets it is for QD testing and prn Thanks

## 2018-09-04 DIAGNOSIS — R69 Illness, unspecified: Secondary | ICD-10-CM | POA: Diagnosis not present

## 2018-09-22 DIAGNOSIS — R69 Illness, unspecified: Secondary | ICD-10-CM | POA: Diagnosis not present

## 2018-09-26 ENCOUNTER — Ambulatory Visit: Payer: Medicare Other | Admitting: Family Medicine

## 2018-09-29 ENCOUNTER — Ambulatory Visit: Payer: Medicare Other | Admitting: Family Medicine

## 2018-09-30 ENCOUNTER — Encounter: Payer: Self-pay | Admitting: Family Medicine

## 2018-09-30 ENCOUNTER — Ambulatory Visit (INDEPENDENT_AMBULATORY_CARE_PROVIDER_SITE_OTHER): Payer: Medicare HMO | Admitting: Family Medicine

## 2018-09-30 VITALS — BP 126/62 | HR 88 | Temp 98.0°F | Ht 61.0 in

## 2018-09-30 DIAGNOSIS — E7800 Pure hypercholesterolemia, unspecified: Secondary | ICD-10-CM

## 2018-09-30 DIAGNOSIS — N289 Disorder of kidney and ureter, unspecified: Secondary | ICD-10-CM | POA: Diagnosis not present

## 2018-09-30 DIAGNOSIS — I10 Essential (primary) hypertension: Secondary | ICD-10-CM | POA: Diagnosis not present

## 2018-09-30 DIAGNOSIS — E11319 Type 2 diabetes mellitus with unspecified diabetic retinopathy without macular edema: Secondary | ICD-10-CM | POA: Diagnosis not present

## 2018-09-30 DIAGNOSIS — I69352 Hemiplegia and hemiparesis following cerebral infarction affecting left dominant side: Secondary | ICD-10-CM | POA: Diagnosis not present

## 2018-09-30 DIAGNOSIS — E538 Deficiency of other specified B group vitamins: Secondary | ICD-10-CM

## 2018-09-30 DIAGNOSIS — E1165 Type 2 diabetes mellitus with hyperglycemia: Secondary | ICD-10-CM

## 2018-09-30 LAB — LIPID PANEL
Cholesterol: 169 mg/dL (ref 0–200)
HDL: 31.9 mg/dL — ABNORMAL LOW (ref >39.00)
NonHDL: 136.71
Total CHOL/HDL Ratio: 5
Triglycerides: 205 mg/dL — ABNORMAL HIGH (ref 0.0–149.0)
VLDL: 41 mg/dL — ABNORMAL HIGH (ref 0.0–40.0)

## 2018-09-30 LAB — COMPREHENSIVE METABOLIC PANEL
ALT: 13 U/L (ref 0–35)
AST: 15 U/L (ref 0–37)
Albumin: 3.8 g/dL (ref 3.5–5.2)
Alkaline Phosphatase: 82 U/L (ref 39–117)
BUN: 27 mg/dL — ABNORMAL HIGH (ref 6–23)
CO2: 28 mEq/L (ref 19–32)
Calcium: 8.7 mg/dL (ref 8.4–10.5)
Chloride: 104 mEq/L (ref 96–112)
Creatinine, Ser: 1.44 mg/dL — ABNORMAL HIGH (ref 0.40–1.20)
GFR: 34.39 mL/min — ABNORMAL LOW (ref >60.00)
Glucose, Bld: 267 mg/dL — ABNORMAL HIGH (ref 70–99)
Potassium: 4.4 mEq/L (ref 3.5–5.1)
Sodium: 140 mEq/L (ref 135–145)
Total Bilirubin: 0.4 mg/dL (ref 0.2–1.2)
Total Protein: 5.7 g/dL — ABNORMAL LOW (ref 6.0–8.3)

## 2018-09-30 LAB — HEMOGLOBIN A1C: Hgb A1c MFr Bld: 6 % (ref 4.6–6.5)

## 2018-09-30 LAB — VITAMIN B12: Vitamin B-12: >1525 pg/mL — ABNORMAL HIGH (ref 211–911)

## 2018-09-30 LAB — LDL CHOLESTEROL, DIRECT: Direct LDL: 96 mg/dL

## 2018-09-30 MED ORDER — METOPROLOL SUCCINATE ER 25 MG PO TB24: 12.5000 mg | ORAL_TABLET | Freq: Two times a day (BID) | ORAL | 3 refills | Status: DC

## 2018-09-30 MED ORDER — SERTRALINE HCL 50 MG PO TABS: 50.0000 mg | ORAL_TABLET | Freq: Every day | ORAL | 3 refills | Status: DC

## 2018-09-30 MED ORDER — TRANDOLAPRIL 4 MG PO TABS: 4.0000 mg | ORAL_TABLET | Freq: Every day | ORAL | 3 refills | Status: DC

## 2018-09-30 MED ORDER — AMLODIPINE BESYLATE 10 MG PO TABS: 10.0000 mg | ORAL_TABLET | Freq: Every day | ORAL | 3 refills | Status: DC

## 2018-09-30 MED ORDER — ALPRAZOLAM 1 MG PO TABS: 0.5000 mg | ORAL_TABLET | Freq: Two times a day (BID) | ORAL | 0 refills | Status: DC | PRN

## 2018-09-30 MED ORDER — GEMFIBROZIL 600 MG PO TABS: 600.0000 mg | ORAL_TABLET | Freq: Every day | ORAL | 3 refills | Status: DC

## 2018-09-30 MED ORDER — GLIPIZIDE ER 2.5 MG PO TB24: 2.5000 mg | ORAL_TABLET | Freq: Every day | ORAL | 3 refills | Status: DC

## 2018-09-30 MED ORDER — MIRTAZAPINE 15 MG PO TABS: 7.5000 mg | ORAL_TABLET | Freq: Every day | ORAL | 3 refills | Status: DC

## 2018-09-30 NOTE — Patient Instructions (Addendum)
Try saline nasal spray in the morning to help congestion (I like the brand simply saline)-it comes in an can   Labs today for sugar and A1C and kidney and cholesterol   Try to eat a healthy diet Keep walking in the house with walker for exercise   No change in medicines

## 2018-09-30 NOTE — Assessment & Plan Note (Signed)
Lab Results  Component Value Date   HGBA1C 5.8 03/14/2018   a1c today - per pt glucose levels have been higher  [facility] Disc diet  Enc walking with walker in house for exercise

## 2018-09-30 NOTE — Assessment & Plan Note (Signed)
Lipid panel today  Disc goals for lipids and reasons to control them Rev last labs with pt Rev low sat fat diet in detail On gemfibrozil - holding off on statin due to potential rxn with this

## 2018-09-30 NOTE — Assessment & Plan Note (Signed)
No clinical changes  Again - PT helped strength on other side and balance

## 2018-09-30 NOTE — Assessment & Plan Note (Signed)
bp in fair control at this time  BP Readings from Last 1 Encounters:  09/30/18 126/62   No changes needed Most recent labs reviewed  Disc lifstyle change with low sodium diet and exercise  Labs today

## 2018-09-30 NOTE — Assessment & Plan Note (Signed)
Lab today  HTN and DM  Continue to f/u with renal

## 2018-09-30 NOTE — Progress Notes (Signed)
Subjective:    Patient ID: Renee [name], female    DOB: 02-Jul-1929, 85 y.o.   MRN: [id number]  HPI Here for f/u of chronic health problems  Had an episode of diarrhea 3 am None since  No blood in stool Feels better now  No n/v  No fever   Some nasal cong in ams   Lost a tooth since last visit- was starting to get infected  Had it pulled    Wt Readings from Last 3 Encounters:  03/25/18 178 lb 8 oz (81 kg)  03/14/18 176 lb 8 oz (80.1 kg)  01/17/18 181 lb (82.1 kg)  stable  Appetite is good overall  33.73 kg/m   bp is stable today  No cp or palpitations or headaches or edema  No side effects to medicines  BP Readings from Last 3 Encounters:  09/30/18 126/62  03/25/18 138/60  03/14/18 (!) 142/78     Lab Results  Component Value Date   CREATININE 1.48 (H) 03/14/2018   BUN 29 (H) 03/14/2018   NA 143 03/14/2018   K 4.4 03/14/2018   CL 109 03/14/2018   CO2 25 03/14/2018  followed by renal for renal insuff    DM 2 Glucose has been higher than usual  162 yesterday - in am  Lab Results  Component Value Date   HGBA1C 5.8 03/14/2018  on glipizide  Due for re check  Glucose control  Has retinopathy -eye exam 6/19  Still avoids sweets  Feet bother her a little (like they are asleep)  That is brief and goes away quickly  Not a lot of exercise due to cold weather Does some walking in the house (did her PT and it was helpful)   Had one fall Tuesday  Walking around a table w/o walker (no injuries)  She learned not to avoid the walker    Hyperlipidemia Lab Results  Component Value Date   CHOL 159 03/14/2018   HDL 36.70 (L) 03/14/2018   LDLCALC 89 03/14/2018   LDLDIRECT 98.0 03/12/2017   TRIG 167.0 (H) 03/14/2018   CHOLHDL 4 03/14/2018  gemfbrozil  She eats sausage/biscuit about once per week  No greasy foods  Rarely eats beef   Patient Active Problem List   Diagnosis Date Noted  . External hemorrhoid 01/17/2018  . History of colitis 01/17/2018  .  Generalized weakness 12/24/2016  . Diabetic retinopathy ([place]) 12/11/2016  . History of CVA (cerebrovascular accident) 12/11/2016  . Estrogen deficiency 08/30/2015  . Encounter for Medicare annual wellness exam 04/17/2013  . Mobility impaired 06/26/2011  . History of retinal detachment 01/10/2011  . Sleep apnea 11/28/2010  . Depression with anxiety 08/25/2010  . Hemiplegia, late effect of cerebrovascular disease ([place]) 07/05/2010  . POSTHERPETIC NEURALGIA 11/09/2009  . Renal insufficiency 06/29/2008  . BACK PAIN 01/26/2008  . EDEMA 01/26/2008  . B12 deficiency 01/10/2007  . Type 2 diabetes, controlled, with retinopathy ([place]) 11/27/2006  . HLD (hyperlipidemia) 11/27/2006  . Essential hypertension 11/27/2006  . FIBROCYSTIC BREAST DISEASE 11/27/2006  . ROSACEA 11/27/2006  . OSTEOARTHRITIS 11/27/2006  . URINARY INCONTINENCE, MIXED 11/27/2006   Past Medical History:  Diagnosis Date  . Angioedema    possibly from voltaren  . Bronchopneumonia 12/11/2016  . Degenerative disc disease   . Diabetes mellitus    type II  . Hyperlipidemia   . Hypertension   . LVH (left ventricular hypertrophy)    and atrial enlargement by echo in past with nl EF  . Nasal  pruritis   . Osteoarthritis   . Osteopenia   . Renal insufficiency   . Sleep apnea   . Stroke [facility]) 05/2010   Small vessel sobcortical (in [place] trial) with Dr Leonie Man, residual L hemiparesis  . Vitamin B 12 deficiency 04/08   Past Surgical History:  Procedure Laterality Date  . ABDOMINAL HYSTERECTOMY     BSO-fibroids  . APPENDECTOMY    . BACK SURGERY    . COLON SURGERY     due to punctured intestines  . EYE SURGERY     cataract extraction  . KNEE SURGERY     arthroscope  . PARS PLANA VITRECTOMY  07/31/2011   Procedure: PARS PLANA VITRECTOMY WITH 25 GAUGE;  Surgeon: [name], MD;  Location: [facility];  Service: Ophthalmology;  Laterality: Right;  REMOVAL OF SILICONE OIL AND LASER RIGHT EYE  . RETINAL DETACHMENT SURGERY   02/18/11   times 2  . SPINE SURGERY  08/09   spinal decompression surgery   Social History   Tobacco Use  . Smoking status: Never Smoker  . Smokeless tobacco: Never Used  Substance Use Topics  . Alcohol use: No    Alcohol/week: 0.0 standard drinks  . Drug use: No   Family History  Problem Relation Age of Onset  . COPD Brother   . Cancer Sister        brain tumor with hemmorhage  . Heart disease Sister        CAD   Allergies  Allergen Reactions  . Diclofenac Sodium Other (See Comments)    REACTION: angioedema  . Pioglitazone Swelling   Current Outpatient Medications on File Prior to Visit  Medication Sig Dispense Refill  . acetaminophen (TYLENOL) 500 MG tablet Take 1,000 mg by mouth at bedtime.     Marland Kitchen albuterol (PROVENTIL HFA;VENTOLIN HFA) 108 (90 Base) MCG/ACT inhaler Inhale 2 puffs into the lungs every 4 (four) hours as needed for wheezing. 1 Inhaler 3  . aspirin 325 MG tablet Take 325 mg by mouth daily.     . beta carotene w/minerals (OCUVITE) tablet Take 1 tablet by mouth 2 (two) times daily.     . Blood Glucose Monitoring Suppl (ONE TOUCH ULTRA 2) w/Device KIT Check blood sugar once daily and as directed. Dx E11.9 1 each 0  . calcium-vitamin D (OSCAL WITH D) 500-200 MG-UNIT per tablet Take 1 tablet by mouth daily.     . Cholecalciferol (VITAMIN D) 1000 UNITS capsule Take 1,000 Units by mouth daily.     . diphenoxylate-atropine (LOMOTIL) 2.5-0.025 MG tablet Take 1 tablet by mouth 2 (two) times daily as needed for diarrhea or loose stools. 20 tablet 0  . docusate sodium (COLACE) 100 MG capsule Take 200 mg by mouth at bedtime.    . ferrous sulfate 325 (65 FE) MG tablet Take 325 mg by mouth 2 (two) times daily.    Marland Kitchen glucose blood (ONE TOUCH ULTRA TEST) test strip Check blood sugar once daily and as directed. Dx E11.9 100 each 1  . guaiFENesin (ROBITUSSIN) 100 MG/5ML SOLN Take 5 mLs by mouth every 4 (four) hours as needed for cough or to loosen phlegm.    . hydrALAZINE  (APRESOLINE) 100 MG tablet Take 100 mg by mouth 3 (three) times daily.     . Multiple Vitamin (MULTIVITAMIN) capsule Take 1 capsule by mouth daily.     Glory Rosebush DELICA LANCETS 40H MISC 1 Stick by Other route daily. Check blood sugar once daily and as directed.  Dx E11.9 100 each 1  . Polyvinyl Alcohol-Povidone (REFRESH OP) Place 1 drop into both eyes daily as needed (dry eyes).    . vitamin B-12 (CYANOCOBALAMIN) 100 MCG tablet Take 100 mcg by mouth daily.     No current facility-administered medications on file prior to visit.     Review of Systems  Constitutional: Negative for activity change, appetite change, fatigue, fever and unexpected weight change.  HENT: Positive for congestion and postnasal drip. Negative for ear pain, rhinorrhea, sinus pressure and sore throat.   Eyes: Negative for pain, redness and visual disturbance.  Respiratory: Negative for cough, shortness of breath and wheezing.   Cardiovascular: Negative for chest pain and palpitations.  Gastrointestinal: Positive for diarrhea. Negative for abdominal pain, anal bleeding, blood in stool, constipation, nausea, rectal pain and vomiting.       Diarrhea once Resolved now   Endocrine: Negative for polydipsia and polyuria.  Genitourinary: Negative for dysuria, frequency and urgency.  Musculoskeletal: Negative for arthralgias, back pain and myalgias.  Skin: Negative for pallor and rash.  Allergic/Immunologic: Negative for environmental allergies.  Neurological: Positive for weakness. Negative for dizziness, syncope, light-headedness and headaches.  Hematological: Negative for adenopathy. Does not bruise/bleed easily.  Psychiatric/Behavioral: Negative for decreased concentration and dysphoric mood. The patient is not nervous/anxious.        Objective:   Physical Exam Constitutional:      General: She is not in acute distress.    Appearance: Normal appearance. She is well-developed. She is obese. She is not ill-appearing.    HENT:     Head: Normocephalic and atraumatic.     Nose: Rhinorrhea present.     Mouth/Throat:     Mouth: Mucous membranes are moist.     Pharynx: Oropharynx is clear.  Eyes:     General: No scleral icterus.       Right eye: No discharge.        Left eye: No discharge.     Conjunctiva/sclera: Conjunctivae normal.     Pupils: Pupils are equal, round, and reactive to light.  Neck:     Musculoskeletal: Normal range of motion and neck supple. No neck rigidity.     Thyroid: No thyromegaly.     Vascular: No carotid bruit or JVD.  Cardiovascular:     Rate and Rhythm: Normal rate and regular rhythm.     Heart sounds: Normal heart sounds. No gallop.   Pulmonary:     Effort: Pulmonary effort is normal. No respiratory distress.     Breath sounds: Normal breath sounds. No stridor. No wheezing, rhonchi or rales.     Comments: No wheeze even on forced exp Abdominal:     General: Bowel sounds are normal. There is no distension or abdominal bruit.     Palpations: Abdomen is soft. There is no mass.     Tenderness: There is no abdominal tenderness.  Musculoskeletal:     Right lower leg: No edema.     Left lower leg: No edema.  Lymphadenopathy:     Cervical: No cervical adenopathy.  Skin:    General: Skin is warm and dry.     Capillary Refill: Capillary refill takes less than 2 seconds.     Findings: No lesion or rash.  Neurological:     Mental Status: She is alert. Mental status is at baseline.     Motor: Weakness present.     Coordination: Coordination normal.     Deep Tendon Reflexes: Reflexes are normal and symmetric.  Reflexes normal.  Psychiatric:        Mood and Affect: Mood normal.           Assessment & Plan:   Problem List Items Addressed This Visit      Cardiovascular and Mediastinum   Essential hypertension - Primary    bp in fair control at this time  BP Readings from Last 1 Encounters:  09/30/18 126/62   No changes needed Most recent labs reviewed  Disc  lifstyle change with low sodium diet and exercise  Labs today      Relevant Medications   amLODipine (NORVASC) 10 MG tablet   gemfibrozil (LOPID) 600 MG tablet   trandolapril (MAVIK) 4 MG tablet   metoprolol succinate (TOPROL-XL) 25 MG 24 hr tablet     Endocrine   Type 2 diabetes, controlled, with retinopathy ([place])    Lab Results  Component Value Date   HGBA1C 5.8 03/14/2018   a1c today - per pt glucose levels have been higher  utd eye and foot care Disc diet  Enc walking with walker in house for exercise        Relevant Medications   glipiZIDE (GLUCOTROL XL) 2.5 MG 24 hr tablet   trandolapril (MAVIK) 4 MG tablet   Diabetic retinopathy ([place])    utd eye exams  No change in vision  Working to keep DM controlled      Relevant Medications   glipiZIDE (GLUCOTROL XL) 2.5 MG 24 hr tablet   trandolapril (MAVIK) 4 MG tablet     Nervous and Auditory   Hemiplegia, late effect of cerebrovascular disease ([facility])    No clinical changes  Again - PT helped strength on other side and balance        Genitourinary   Renal insufficiency    Lab today  HTN and DM  Continue to f/u with renal         Other   B12 deficiency   HLD (hyperlipidemia)    Lipid panel today  Disc goals for lipids and reasons to control them Rev last labs with pt Rev low sat fat diet in detail On gemfibrozil - holding off on statin due to potential rxn with this       Relevant Medications   amLODipine (NORVASC) 10 MG tablet   gemfibrozil (LOPID) 600 MG tablet   trandolapril (MAVIK) 4 MG tablet   metoprolol succinate (TOPROL-XL) 25 MG 24 hr tablet

## 2018-09-30 NOTE — Assessment & Plan Note (Signed)
utd eye exams  No change in vision  Working to keep DM controlled

## 2018-10-01 ENCOUNTER — Encounter: Payer: Self-pay | Admitting: *Deleted

## 2018-10-06 ENCOUNTER — Telehealth: Payer: Self-pay | Admitting: *Deleted

## 2018-10-06 NOTE — Telephone Encounter (Signed)
Thayer Headings (not on [place]) left a voicemail stating that she is patient's caregiver and needs a letter for [employer]. Anderson Malta stated that she is patient's caregiver 6 days a week and was told that she should be able to get a letter from patient's PCP stating this and she should not have to go for jury duty because of her being the caregiver.

## 2018-10-07 NOTE — Telephone Encounter (Signed)
Routing to [place] (RN team lead) to see if this is okay to give to caregiver

## 2018-10-07 NOTE — Telephone Encounter (Signed)
Letter done and in IN box

## 2018-10-10 NOTE — Telephone Encounter (Signed)
Spoke with patient to make her aware of caregivers request and that we cannot release any info or letter with her personal information to the caregiver as she is not on patient's DPR.   Patient notified that she can come by and add her to the list and then a letter could be provided.  But, only at patient's approval for this caregiver to be able to receive medical information on patient.   Patient states that she would like caregiver, Renee [name], added to her list of okay to discuss/release medical info.  She will have her caregiver bring her by so that she can sign the [facility] permission form.   Thanks.

## 2018-10-13 NOTE — Telephone Encounter (Signed)
Pt came into office and signed DPR allowing access to [place] to [facility]. She states she is with her 6 days a week and needs a note for jury duty on the date March 24th. Pt's caregiver would like for you to call her regarding this matter. CB [phone].

## 2018-10-17 NOTE — Telephone Encounter (Signed)
Confirmed DPR has been updated and Anderson Malta has been added as someone to receive communication and information regarding health care of patient.  I spoke with Mrs. [place]ois and let her know that Anderson Malta can pick up the note next week as requested.  She will let her know.

## 2018-10-21 NOTE — Telephone Encounter (Signed)
I did have letter re-written to remove any personal patient identifying information that will be sent out to outside source to comply with confidentiality requirements.  Letter provided for pick up without any PPI listed.

## 2018-10-27 DIAGNOSIS — R600 Localized edema: Secondary | ICD-10-CM | POA: Diagnosis not present

## 2018-10-27 DIAGNOSIS — R809 Proteinuria, unspecified: Secondary | ICD-10-CM | POA: Diagnosis not present

## 2018-10-27 DIAGNOSIS — N183 Chronic kidney disease, stage 3 (moderate): Secondary | ICD-10-CM | POA: Diagnosis not present

## 2018-10-27 DIAGNOSIS — I10 Essential (primary) hypertension: Secondary | ICD-10-CM | POA: Diagnosis not present

## 2018-10-27 DIAGNOSIS — N2581 Secondary hyperparathyroidism of renal origin: Secondary | ICD-10-CM | POA: Diagnosis not present

## 2018-10-28 DIAGNOSIS — I10 Essential (primary) hypertension: Secondary | ICD-10-CM | POA: Diagnosis not present

## 2018-10-28 DIAGNOSIS — R69 Illness, unspecified: Secondary | ICD-10-CM | POA: Diagnosis not present

## 2018-10-28 DIAGNOSIS — I69334 Monoplegia of upper limb following cerebral infarction affecting left non-dominant side: Secondary | ICD-10-CM | POA: Diagnosis not present

## 2018-10-28 DIAGNOSIS — K5900 Constipation, unspecified: Secondary | ICD-10-CM | POA: Diagnosis not present

## 2018-10-28 DIAGNOSIS — I69328 Other speech and language deficits following cerebral infarction: Secondary | ICD-10-CM | POA: Diagnosis not present

## 2018-10-28 DIAGNOSIS — E785 Hyperlipidemia, unspecified: Secondary | ICD-10-CM | POA: Diagnosis not present

## 2018-10-28 DIAGNOSIS — G4700 Insomnia, unspecified: Secondary | ICD-10-CM | POA: Diagnosis not present

## 2018-10-28 DIAGNOSIS — E669 Obesity, unspecified: Secondary | ICD-10-CM | POA: Diagnosis not present

## 2018-10-28 DIAGNOSIS — D649 Anemia, unspecified: Secondary | ICD-10-CM | POA: Diagnosis not present

## 2018-12-14 ENCOUNTER — Other Ambulatory Visit: Payer: Self-pay | Admitting: Family Medicine

## 2018-12-14 DIAGNOSIS — R69 Illness, unspecified: Secondary | ICD-10-CM | POA: Diagnosis not present

## 2018-12-15 NOTE — Telephone Encounter (Signed)
Name of Medication: Xanax Name of Pharmacy: [facility] or Written Date and Quantity: 09/30/18 #30 tabs with 0 refills Last Office Visit and Type: 6 month f/u on 09/30/18 Next Office Visit and Type: CPE/AWV on 03/30/19 Last Controlled Substance Agreement Date: 09/12/16 Last UDS:09/12/16

## 2019-02-04 ENCOUNTER — Encounter (INDEPENDENT_AMBULATORY_CARE_PROVIDER_SITE_OTHER): Payer: Medicare HMO | Admitting: Ophthalmology

## 2019-02-04 ENCOUNTER — Other Ambulatory Visit: Payer: Self-pay

## 2019-02-04 DIAGNOSIS — H353122 Nonexudative age-related macular degeneration, left eye, intermediate dry stage: Secondary | ICD-10-CM

## 2019-02-04 DIAGNOSIS — H43812 Vitreous degeneration, left eye: Secondary | ICD-10-CM

## 2019-02-04 DIAGNOSIS — I10 Essential (primary) hypertension: Secondary | ICD-10-CM

## 2019-02-04 DIAGNOSIS — H338 Other retinal detachments: Secondary | ICD-10-CM

## 2019-02-04 DIAGNOSIS — H35033 Hypertensive retinopathy, bilateral: Secondary | ICD-10-CM

## 2019-02-04 DIAGNOSIS — H35371 Puckering of macula, right eye: Secondary | ICD-10-CM

## 2019-02-04 DIAGNOSIS — E113392 Type 2 diabetes mellitus with moderate nonproliferative diabetic retinopathy without macular edema, left eye: Secondary | ICD-10-CM

## 2019-02-04 DIAGNOSIS — E11319 Type 2 diabetes mellitus with unspecified diabetic retinopathy without macular edema: Secondary | ICD-10-CM | POA: Diagnosis not present

## 2019-02-04 LAB — HM DIABETES EYE EXAM

## 2019-02-17 ENCOUNTER — Encounter: Payer: Self-pay | Admitting: Family Medicine

## 2019-02-23 DIAGNOSIS — I10 Essential (primary) hypertension: Secondary | ICD-10-CM | POA: Diagnosis not present

## 2019-02-23 DIAGNOSIS — N183 Chronic kidney disease, stage 3 (moderate): Secondary | ICD-10-CM | POA: Diagnosis not present

## 2019-02-23 DIAGNOSIS — R809 Proteinuria, unspecified: Secondary | ICD-10-CM | POA: Diagnosis not present

## 2019-02-23 DIAGNOSIS — N2581 Secondary hyperparathyroidism of renal origin: Secondary | ICD-10-CM | POA: Diagnosis not present

## 2019-03-09 ENCOUNTER — Other Ambulatory Visit: Payer: Self-pay | Admitting: Family Medicine

## 2019-03-09 DIAGNOSIS — R69 Illness, unspecified: Secondary | ICD-10-CM | POA: Diagnosis not present

## 2019-03-10 DIAGNOSIS — R69 Illness, unspecified: Secondary | ICD-10-CM | POA: Diagnosis not present

## 2019-03-13 ENCOUNTER — Other Ambulatory Visit: Payer: Self-pay | Admitting: *Deleted

## 2019-03-13 DIAGNOSIS — R69 Illness, unspecified: Secondary | ICD-10-CM | POA: Diagnosis not present

## 2019-03-13 MED ORDER — ONETOUCH DELICA LANCETS 30G MISC: 1.0000 | Freq: Every day | 1 refills | Status: DC

## 2019-03-30 ENCOUNTER — Ambulatory Visit (INDEPENDENT_AMBULATORY_CARE_PROVIDER_SITE_OTHER): Payer: Medicare HMO | Admitting: Family Medicine

## 2019-03-30 ENCOUNTER — Encounter: Payer: Self-pay | Admitting: Family Medicine

## 2019-03-30 ENCOUNTER — Ambulatory Visit: Payer: Medicare Other

## 2019-03-30 ENCOUNTER — Other Ambulatory Visit: Payer: Self-pay

## 2019-03-30 VITALS — BP 138/64 | HR 64 | Temp 98.0°F | Ht 61.0 in | Wt 187.5 lb

## 2019-03-30 DIAGNOSIS — I10 Essential (primary) hypertension: Secondary | ICD-10-CM

## 2019-03-30 DIAGNOSIS — N289 Disorder of kidney and ureter, unspecified: Secondary | ICD-10-CM

## 2019-03-30 DIAGNOSIS — E538 Deficiency of other specified B group vitamins: Secondary | ICD-10-CM | POA: Diagnosis not present

## 2019-03-30 DIAGNOSIS — R531 Weakness: Secondary | ICD-10-CM | POA: Diagnosis not present

## 2019-03-30 DIAGNOSIS — E7800 Pure hypercholesterolemia, unspecified: Secondary | ICD-10-CM

## 2019-03-30 DIAGNOSIS — E11319 Type 2 diabetes mellitus with unspecified diabetic retinopathy without macular edema: Secondary | ICD-10-CM | POA: Diagnosis not present

## 2019-03-30 DIAGNOSIS — R69 Illness, unspecified: Secondary | ICD-10-CM | POA: Diagnosis not present

## 2019-03-30 DIAGNOSIS — Z0000 Encounter for general adult medical examination without abnormal findings: Secondary | ICD-10-CM | POA: Diagnosis not present

## 2019-03-30 DIAGNOSIS — F418 Other specified anxiety disorders: Secondary | ICD-10-CM

## 2019-03-30 LAB — LIPID PANEL
Cholesterol: 188 mg/dL (ref 0–200)
HDL: 41.5 mg/dL (ref >39.00)
LDL Cholesterol: 119 mg/dL — ABNORMAL HIGH (ref 0–99)
NonHDL: 146.81
Total CHOL/HDL Ratio: 5
Triglycerides: 141 mg/dL (ref 0.0–149.0)
VLDL: 28.2 mg/dL (ref 0.0–40.0)

## 2019-03-30 LAB — CBC WITH DIFFERENTIAL/PLATELET
Basophils Absolute: 0.1 10*3/uL (ref 0.0–0.1)
Basophils Relative: 1.1 % (ref 0.0–3.0)
Eosinophils Absolute: 0.2 10*3/uL (ref 0.0–0.7)
Eosinophils Relative: 3.3 % (ref 0.0–5.0)
HCT: 36 % (ref 36.0–46.0)
Hemoglobin: 11.8 g/dL — ABNORMAL LOW (ref 12.0–15.0)
Lymphocytes Relative: 17.1 % (ref 12.0–46.0)
Lymphs Abs: 1 10*3/uL (ref 0.7–4.0)
MCHC: 32.7 g/dL (ref 30.0–36.0)
MCV: 87.7 fl (ref 78.0–100.0)
Monocytes Absolute: 0.4 10*3/uL (ref 0.1–1.0)
Monocytes Relative: 7.6 % (ref 3.0–12.0)
Neutro Abs: 4.2 10*3/uL (ref 1.4–7.7)
Neutrophils Relative %: 70.9 % (ref 43.0–77.0)
Platelets: 188 10*3/uL (ref 150.0–400.0)
RBC: 4.11 Mil/uL (ref 3.87–5.11)
RDW: 14.2 % (ref 11.5–15.5)
WBC: 5.9 10*3/uL (ref 4.0–10.5)

## 2019-03-30 LAB — COMPREHENSIVE METABOLIC PANEL
ALT: 11 U/L (ref 0–35)
AST: 15 U/L (ref 0–37)
Albumin: 4 g/dL (ref 3.5–5.2)
Alkaline Phosphatase: 85 U/L (ref 39–117)
BUN: 29 mg/dL — ABNORMAL HIGH (ref 6–23)
CO2: 28 mEq/L (ref 19–32)
Calcium: 9.1 mg/dL (ref 8.4–10.5)
Chloride: 105 mEq/L (ref 96–112)
Creatinine, Ser: 1.51 mg/dL — ABNORMAL HIGH (ref 0.40–1.20)
GFR: 32.52 mL/min — ABNORMAL LOW (ref >60.00)
Glucose, Bld: 136 mg/dL — ABNORMAL HIGH (ref 70–99)
Potassium: 4.6 mEq/L (ref 3.5–5.1)
Sodium: 141 mEq/L (ref 135–145)
Total Bilirubin: 0.4 mg/dL (ref 0.2–1.2)
Total Protein: 5.5 g/dL — ABNORMAL LOW (ref 6.0–8.3)

## 2019-03-30 LAB — VITAMIN B12: Vitamin B-12: 530 pg/mL (ref 211–911)

## 2019-03-30 LAB — HEMOGLOBIN A1C: Hgb A1c MFr Bld: 6 % (ref 4.6–6.5)

## 2019-03-30 LAB — TSH: TSH: 6.8 u[IU]/mL — ABNORMAL HIGH (ref 0.35–4.50)

## 2019-03-30 NOTE — Assessment & Plan Note (Signed)
Reviewed health habits including diet and exercise and skin cancer prevention Reviewed appropriate screening tests for age  Also reviewed health mt list, fam hx and immunization status , as well as social and family history    Labs ordered  See HPI Enc flu shot in the fall  Disc fall prev in home- pt will see if her ins will pay again for in home PT Declines dexa for now / no falls or fractures  Enc exercise as tolerated Pt has adv directive No cognitive concerns  Wt is up-watching this  Rev hearing /vision  (looking for her lost hearing aide)  Enc to look into coverage of shingrix vaccine and get on wait list if affordable

## 2019-03-30 NOTE — Assessment & Plan Note (Signed)
Pt continues to work on home PT exercises Uses walker for all ambulation

## 2019-03-30 NOTE — Assessment & Plan Note (Signed)
A1C today Wt gain noted Taking glipizide  No statin due to fibrate  Ace for renal protection  utd eye exam  Good foot care

## 2019-03-30 NOTE — Assessment & Plan Note (Signed)
Disc goals for lipids and reasons to control them Rev last labs with pt Rev low sat fat diet in detail  Taking gemfibrozil so hesitant to add statin due to poss interaction  Labs today

## 2019-03-30 NOTE — Assessment & Plan Note (Signed)
bp in fair control at this time  BP Readings from Last 1 Encounters:  03/30/19 138/64   No changes needed Most recent labs reviewed  Disc lifstyle change with low sodium diet and exercise

## 2019-03-30 NOTE — Patient Instructions (Addendum)
Get a flu shot in the fall   If you are interested in the new shingles vaccine (Shingrix) - call your local pharmacy to check on coverage and availability  If affordable, get on a wait list at your pharmacy to get the vaccine.  If you want a bone density test (? If you would consider medication if bone density got lower) - just let us know and we will schedule it   Stay active   If your insurance will pay for more in home physical therapy let me know

## 2019-03-30 NOTE — Assessment & Plan Note (Signed)
Lab today  Continue good fluid intake and also renal f/u

## 2019-03-30 NOTE — Progress Notes (Signed)
Subjective:    Patient ID: Renee [name], female    DOB: 12-07-1928, 85 y.o.   MRN: [id number]  HPI Here for amw and health mt exam as well as rev of chronic health problems   I have personally reviewed the Medicare Annual Wellness questionnaire and have noted 1. The patient's medical and social history 2. Their use of alcohol, tobacco or illicit drugs 3. Their current medications and supplements 4. The patient's functional ability including ADL's, fall risks, home safety risks and hearing or visual             impairment. 5. Diet and physical activities 6. Evidence for depression or mood disorders  The patients weight, height, BMI have been recorded in the chart and visual acuity is per eye clinic.  I have made referrals, counseling and provided education to the patient based review of the above and I have provided the pt with a written personalized care plan for preventive services. Reviewed and updated provider list, see scanned forms.  See scanned forms.  Routine anticipatory guidance given to patient.  See health maintenance. Colon cancer screening- out aged  Breast cancer screening 11/19 Self breast exam-no lumps  Flu vaccine 10/19 Tetanus vaccine Td 8/13 Pneumovax complete Zoster vaccine-interested  dexa 1/17= osteopenia  Falls-none Fractures - none  Supplements taking ca and D Exercise -walks with walker (always unsteady) -walking up and down driveway Advance directive -has a living will and poa  Cognitive function addressed- see scanned forms- and if abnormal then additional documentation follows.  Has an excellent memory - family tells her this   PMH and SH reviewed  Meds, vitals, and allergies reviewed.   ROS: See HPI.  Otherwise negative.    Wt Readings from Last 3 Encounters:  03/30/19 187 lb 8 oz (85 kg)  03/25/18 178 lb 8 oz (81 kg)  03/14/18 176 lb 8 oz (80.1 kg)  less exercise  35.43 kg/m   Hearing/vision  Hearing Screening   125Hz  250Hz  500Hz   1000Hz  2000Hz  3000Hz  4000Hz  6000Hz  8000Hz   Right ear:           Left ear:           Comments: Pt wears hearing aids   Vision Screening Comments: Pt had eye exam in 2020, with Dr. Einar Gip lost hearing aide L yesterday-upset about this   Vision is getting weaker-with retinopathy  No change in glasses   bp is stable today  No cp or palpitations or headaches or edema  No side effects to medicines  BP Readings from Last 3 Encounters:  03/30/19 138/64  09/30/18 126/62  03/25/18 138/60     Due for labs Also h/o renal insuff Lab Results  Component Value Date   CREATININE 1.44 (H) 09/30/2018   BUN 27 (H) 09/30/2018   NA 140 09/30/2018   K 4.4 09/30/2018   CL 104 09/30/2018   CO2 28 09/30/2018   Trying to drink fluids for her renal fxn  Drinks orange gatorade   DM2 Lab Results  Component Value Date   HGBA1C 6.0 09/30/2018   Due for labs  Eye exam 6/20 with retinopathy   Takes ace Also triglyceride agent   B12 def Had to hold injections for high level  Lab Results  Component Value Date   VITAMINB12 >1525 (H) 09/30/2018   Due for labs    Mood -anxious today due to loss of hearing aide    Hyperlipidemia Lab Results  Component Value Date   CHOL  169 09/30/2018   HDL 31.90 (L) 09/30/2018   LDLCALC 89 03/14/2018   LDLDIRECT 96.0 09/30/2018   TRIG 205.0 (H) 09/30/2018   CHOLHDL 5 09/30/2018  taking gemfibrozil Holding statin for poss interaction  Due for labs  Diet is fair  Up and down  Per family-eats well   Patient Active Problem List   Diagnosis Date Noted  . Routine general medical examination at a health care facility 03/30/2019  . External hemorrhoid 01/17/2018  . History of colitis 01/17/2018  . Generalized weakness 12/24/2016  . Diabetic retinopathy ([place]) 12/11/2016  . History of CVA (cerebrovascular accident) 12/11/2016  . Estrogen deficiency 08/30/2015  . Encounter for Medicare annual wellness exam 04/17/2013  . Mobility impaired  06/26/2011  . History of retinal detachment 01/10/2011  . Sleep apnea 11/28/2010  . Depression with anxiety 08/25/2010  . Hemiplegia, late effect of cerebrovascular disease ([place]) 07/05/2010  . POSTHERPETIC NEURALGIA 11/09/2009  . Renal insufficiency 06/29/2008  . BACK PAIN 01/26/2008  . EDEMA 01/26/2008  . B12 deficiency 01/10/2007  . Type 2 diabetes, controlled, with retinopathy ([place]) 11/27/2006  . HLD (hyperlipidemia) 11/27/2006  . Essential hypertension 11/27/2006  . FIBROCYSTIC BREAST DISEASE 11/27/2006  . ROSACEA 11/27/2006  . OSTEOARTHRITIS 11/27/2006  . URINARY INCONTINENCE, MIXED 11/27/2006   Past Medical History:  Diagnosis Date  . Angioedema    possibly from voltaren  . Bronchopneumonia 12/11/2016  . Degenerative disc disease   . Diabetes mellitus    type II  . Hyperlipidemia   . Hypertension   . LVH (left ventricular hypertrophy)    and atrial enlargement by echo in past with nl EF  . Nasal pruritis   . Osteoarthritis   . Osteopenia   . Renal insufficiency   . Sleep apnea   . Stroke [facility]) 05/2010   Small vessel sobcortical ([place] trial) with Dr Leonie Man, residual L hemiparesis  . Vitamin B 12 deficiency 04/08   Past Surgical History:  Procedure Laterality Date  . ABDOMINAL HYSTERECTOMY     BSO-fibroids  . APPENDECTOMY    . BACK SURGERY    . COLON SURGERY     due to punctured intestines  . EYE SURGERY     cataract extraction  . KNEE SURGERY     arthroscope  . PARS PLANA VITRECTOMY  07/31/2011   Procedure: PARS PLANA VITRECTOMY WITH 25 GAUGE;  Surgeon: [name], MD;  Location: [facility] [place];  Service: Ophthalmology;  Laterality: Right;  REMOVAL OF SILICONE OIL AND LASER RIGHT EYE  . RETINAL DETACHMENT SURGERY  02/18/11   times 2  . SPINE SURGERY  08/09   spinal decompression surgery   Social History   Tobacco Use  . Smoking status: Never Smoker  . Smokeless tobacco: Never Used  Substance Use Topics  . Alcohol use: No    Alcohol/week: 0.0 standard  drinks  . Drug use: No   Family History  Problem Relation Age of Onset  . COPD Brother   . Cancer Sister        brain tumor with hemmorhage  . Heart disease Sister        CAD   Allergies  Allergen Reactions  . Diclofenac Sodium Other (See Comments)    REACTION: angioedema  . Pioglitazone Swelling   Current Outpatient Medications on File Prior to Visit  Medication Sig Dispense Refill  . acetaminophen (TYLENOL) 500 MG tablet Take 1,000 mg by mouth at bedtime.     Marland Kitchen albuterol (PROVENTIL HFA;VENTOLIN HFA) 108 (90  Base) MCG/ACT inhaler Inhale 2 puffs into the lungs every 4 (four) hours as needed for wheezing. 1 Inhaler 3  . ALPRAZolam (XANAX) 0.5 MG tablet TAKE 1 TABLET BY MOUTH TWICE A DAY AS NEEDED FOR ANXIETY 45 tablet 3  . amLODipine (NORVASC) 10 MG tablet Take 1 tablet (10 mg total) by mouth daily. 90 tablet 3  . aspirin 325 MG tablet Take 325 mg by mouth daily.     . beta carotene w/minerals (OCUVITE) tablet Take 1 tablet by mouth 2 (two) times daily.     . Blood Glucose Monitoring Suppl (ONE TOUCH ULTRA 2) w/Device KIT Check blood sugar once daily and as directed. Dx E11.9 1 each 0  . calcium-vitamin D (OSCAL WITH D) 500-200 MG-UNIT per tablet Take 1 tablet by mouth daily.     . Cholecalciferol (VITAMIN D) 1000 UNITS capsule Take 1,000 Units by mouth daily.     . diphenoxylate-atropine (LOMOTIL) 2.5-0.025 MG tablet Take 1 tablet by mouth 2 (two) times daily as needed for diarrhea or loose stools. 20 tablet 0  . docusate sodium (COLACE) 100 MG capsule Take 200 mg by mouth at bedtime.    . ferrous sulfate 325 (65 FE) MG tablet Take 325 mg by mouth 2 (two) times daily.    Marland Kitchen gemfibrozil (LOPID) 600 MG tablet Take 1 tablet (600 mg total) by mouth daily. 90 tablet 3  . glipiZIDE (GLUCOTROL XL) 2.5 MG 24 hr tablet Take 1 tablet (2.5 mg total) by mouth daily with breakfast. 90 tablet 3  . guaiFENesin (ROBITUSSIN) 100 MG/5ML SOLN Take 5 mLs by mouth every 4 (four) hours as needed for cough  or to loosen phlegm.    . hydrALAZINE (APRESOLINE) 100 MG tablet Take 100 mg by mouth 3 (three) times daily.     . metoprolol succinate (TOPROL-XL) 25 MG 24 hr tablet Take 0.5 tablets (12.5 mg total) by mouth 2 (two) times daily. 90 tablet 3  . mirtazapine (REMERON) 15 MG tablet Take 0.5 tablets (7.5 mg total) by mouth at bedtime. 45 tablet 3  . Multiple Vitamin (MULTIVITAMIN) capsule Take 1 capsule by mouth daily.     Glory Rosebush Delica Lancets 26R MISC 1 Stick by Other route daily. Check blood sugar once daily and as directed. Dx E11.9 100 each 1  . ONETOUCH ULTRA test strip CHECK BLOOD SUGAR ONCE DAILY AND AS DIRECTED. DX E11.9 100 strip 1  . Polyvinyl Alcohol-Povidone (REFRESH OP) Place 1 drop into both eyes daily as needed (dry eyes).    . sertraline (ZOLOFT) 50 MG tablet Take 1 tablet (50 mg total) by mouth daily. 90 tablet 3  . trandolapril (MAVIK) 4 MG tablet Take 1 tablet (4 mg total) by mouth daily. 90 tablet 3  . vitamin B-12 (CYANOCOBALAMIN) 100 MCG tablet Take 100 mcg by mouth daily.     No current facility-administered medications on file prior to visit.     Review of Systems  Constitutional: Negative for activity change, appetite change, fatigue, fever and unexpected weight change.  HENT: Negative for congestion, ear pain, rhinorrhea, sinus pressure and sore throat.   Eyes: Negative for pain, redness and visual disturbance.  Respiratory: Negative for cough, shortness of breath and wheezing.   Cardiovascular: Negative for chest pain and palpitations.  Gastrointestinal: Negative for abdominal pain, blood in stool, constipation and diarrhea.  Endocrine: Negative for polydipsia and polyuria.  Genitourinary: Negative for dysuria, frequency and urgency.  Musculoskeletal: Negative for arthralgias, back pain and myalgias.  Skin: Negative  for pallor and rash.  Allergic/Immunologic: Negative for environmental allergies.  Neurological: Positive for weakness. Negative for dizziness,  syncope and headaches.       Generalized weakness/also hemi paresis baseline  Hematological: Negative for adenopathy. Does not bruise/bleed easily.  Psychiatric/Behavioral: Negative for decreased concentration and dysphoric mood. The patient is nervous/anxious.        Objective:   Physical Exam Constitutional:      General: She is not in acute distress.    Appearance: Normal appearance. She is well-developed. She is obese. She is not ill-appearing or diaphoretic.  HENT:     Head: Normocephalic and atraumatic.     Right Ear: Tympanic membrane, ear canal and external ear normal.     Left Ear: Tympanic membrane, ear canal and external ear normal.     Ears:     Comments: Missing hearing aide    Nose: Nose normal.     Mouth/Throat:     Mouth: Mucous membranes are moist.     Pharynx: Oropharynx is clear. No posterior oropharyngeal erythema.  Eyes:     General: No scleral icterus.    Conjunctiva/sclera: Conjunctivae normal.     Pupils: Pupils are equal, round, and reactive to light.  Neck:     Musculoskeletal: Normal range of motion and neck supple. No neck rigidity.     Thyroid: No thyromegaly.     Vascular: No carotid bruit or JVD.  Cardiovascular:     Rate and Rhythm: Normal rate and regular rhythm.     Pulses: Normal pulses.     Heart sounds: Normal heart sounds. No gallop.   Pulmonary:     Effort: Pulmonary effort is normal. No respiratory distress.     Breath sounds: Normal breath sounds. No wheezing.     Comments: Good air exch Chest:     Chest wall: No tenderness.  Abdominal:     General: Bowel sounds are normal. There is no distension or abdominal bruit.     Palpations: Abdomen is soft. There is no mass.     Tenderness: There is no abdominal tenderness.     Hernia: No hernia is present.  Genitourinary:    Comments: Breast exam: No mass, nodules, thickening, tenderness, bulging, retraction, inflamation, nipple discharge or skin changes noted.  No axillary or clavicular  LA.     Musculoskeletal: Normal range of motion.        General: No tenderness.     Right lower leg: No edema.     Left lower leg: No edema.  Lymphadenopathy:     Cervical: No cervical adenopathy.  Skin:    General: Skin is warm and dry.     Coloration: Skin is not pale.     Findings: No erythema or rash.     Comments: Many sks on trunk  Solar lentigines diffusely   Neurological:     Mental Status: She is alert.     Cranial Nerves: No cranial nerve deficit.     Motor: No abnormal muscle tone.     Coordination: Coordination normal.     Deep Tendon Reflexes: Reflexes are normal and symmetric. Reflexes normal.     Comments: Gait is dependent on walker  Cannot get up w/o assistance  Baseline hemiparesis   Psychiatric:        Mood and Affect: Mood normal.        Cognition and Memory: Cognition and memory normal.           Assessment & Plan:  Problem List Items Addressed This Visit      Cardiovascular and Mediastinum   Essential hypertension    bp in fair control at this time  BP Readings from Last 1 Encounters:  03/30/19 138/64   No changes needed Most recent labs reviewed  Disc lifstyle change with low sodium diet and exercise        Relevant Orders   CBC with Differential/Platelet (Completed)   Comprehensive metabolic panel (Completed)   Lipid panel (Completed)   TSH (Completed)     Endocrine   Type 2 diabetes, controlled, with retinopathy ([place])    A1C today Wt gain noted Taking glipizide  No statin due to fibrate  Ace for renal protection  utd eye exam  Good foot care       Relevant Orders   Hemoglobin A1c (Completed)     Genitourinary   Renal insufficiency    Lab today  Continue good fluid intake and also renal f/u      Relevant Orders   Comprehensive metabolic panel (Completed)     Other   B12 deficiency    Now on oral supplementation only  Level today      Relevant Orders   Vitamin B12 (Completed)   HLD (hyperlipidemia)    Disc  goals for lipids and reasons to control them Rev last labs with pt Rev low sat fat diet in detail  Taking gemfibrozil so hesitant to add statin due to poss interaction  Labs today      Relevant Orders   Lipid panel (Completed)   Depression with anxiety    No clinical changes with zoloft (with xanax for anxiety)  Reviewed stressors/ coping techniques/symptoms/ support sources/ tx options and side effects in detail today Stressed use with caution       Encounter for Medicare annual wellness exam - Primary   Diabetic retinopathy ([place])    Had exam 6/20  Continues frequent eye exams      Generalized weakness    Pt continues to work on home PT exercises Uses walker for all ambulation       Routine general medical examination at a health care facility    Reviewed health habits including diet and exercise and skin cancer prevention Reviewed appropriate screening tests for age  Also reviewed health mt list, fam hx and immunization status , as well as social and family history    Labs ordered  See HPI Enc flu shot in the fall  Disc fall prev in home- pt will see if her ins will pay again for in home PT Declines dexa for now / no falls or fractures  Enc exercise as tolerated Pt has adv directive No cognitive concerns  Wt is up-watching this  Rev hearing /vision  (looking for her lost hearing aide)  Enc to look into coverage of shingrix vaccine and get on wait list if affordable

## 2019-03-30 NOTE — Assessment & Plan Note (Signed)
No clinical changes with zoloft (with xanax for anxiety)  Reviewed stressors/ coping techniques/symptoms/ support sources/ tx options and side effects in detail today Stressed use with caution

## 2019-03-30 NOTE — Assessment & Plan Note (Signed)
Now on oral supplementation only  Level today

## 2019-03-30 NOTE — Assessment & Plan Note (Signed)
Had exam 6/20  Continues frequent eye exams

## 2019-03-31 ENCOUNTER — Other Ambulatory Visit (INDEPENDENT_AMBULATORY_CARE_PROVIDER_SITE_OTHER): Payer: Medicare HMO

## 2019-03-31 DIAGNOSIS — R7989 Other specified abnormal findings of blood chemistry: Secondary | ICD-10-CM | POA: Diagnosis not present

## 2019-03-31 LAB — T4, FREE: Free T4: 0.64 ng/dL (ref 0.60–1.60)

## 2019-04-01 ENCOUNTER — Encounter: Payer: Self-pay | Admitting: *Deleted

## 2019-04-20 ENCOUNTER — Other Ambulatory Visit: Payer: Self-pay | Admitting: Family Medicine

## 2019-04-21 NOTE — Telephone Encounter (Signed)
Name of Medication: Xanax Name of Pharmacy: [facility] or Written Date and Quantity: 12/15/18 #45 tabs with 3 refills Last Office Visit and Type:CPE/AWV on 03/30/19 Next Office Visit and Type: none scheduled  Last Controlled Substance Agreement Date: 09/12/16 Last UDS:09/12/16

## 2019-05-01 ENCOUNTER — Telehealth: Payer: Self-pay

## 2019-05-01 DIAGNOSIS — G8929 Other chronic pain: Secondary | ICD-10-CM

## 2019-05-01 DIAGNOSIS — Z7409 Other reduced mobility: Secondary | ICD-10-CM

## 2019-05-01 DIAGNOSIS — M545 Low back pain, unspecified: Secondary | ICD-10-CM

## 2019-05-01 DIAGNOSIS — I69352 Hemiplegia and hemiparesis following cerebral infarction affecting left dominant side: Secondary | ICD-10-CM

## 2019-05-01 DIAGNOSIS — R531 Weakness: Secondary | ICD-10-CM

## 2019-05-01 NOTE — Telephone Encounter (Signed)
Called pt and she said Dr. Glori Bickers told her to check with her insurance to see if they cover in home PT. Pt said her insurance does cover in home PT (she checked), pt would like a referral for PT to help with getting strength back. Pt said [facility] has been coming out to home in the past and she would like to use them again if possible

## 2019-05-01 NOTE — Telephone Encounter (Signed)
I spoke with pt and offered to help her and explained that I was a nurse in triage but pt said she only wants to speak with Dr Marliss Coots nurse  And request cb from [facility].

## 2019-05-03 NOTE — Telephone Encounter (Signed)
Ref done Let her know the office will call her

## 2019-05-08 DIAGNOSIS — N289 Disorder of kidney and ureter, unspecified: Secondary | ICD-10-CM | POA: Diagnosis not present

## 2019-05-08 DIAGNOSIS — M519 Unspecified thoracic, thoracolumbar and lumbosacral intervertebral disc disorder: Secondary | ICD-10-CM | POA: Diagnosis not present

## 2019-05-08 DIAGNOSIS — R69 Illness, unspecified: Secondary | ICD-10-CM | POA: Diagnosis not present

## 2019-05-08 DIAGNOSIS — N6029 Fibroadenosis of unspecified breast: Secondary | ICD-10-CM | POA: Diagnosis not present

## 2019-05-08 DIAGNOSIS — M1990 Unspecified osteoarthritis, unspecified site: Secondary | ICD-10-CM | POA: Diagnosis not present

## 2019-05-08 DIAGNOSIS — I119 Hypertensive heart disease without heart failure: Secondary | ICD-10-CM | POA: Diagnosis not present

## 2019-05-08 DIAGNOSIS — G8929 Other chronic pain: Secondary | ICD-10-CM | POA: Diagnosis not present

## 2019-05-08 DIAGNOSIS — E11319 Type 2 diabetes mellitus with unspecified diabetic retinopathy without macular edema: Secondary | ICD-10-CM | POA: Diagnosis not present

## 2019-05-08 DIAGNOSIS — I69352 Hemiplegia and hemiparesis following cerebral infarction affecting left dominant side: Secondary | ICD-10-CM | POA: Diagnosis not present

## 2019-05-08 DIAGNOSIS — E538 Deficiency of other specified B group vitamins: Secondary | ICD-10-CM | POA: Diagnosis not present

## 2019-05-11 ENCOUNTER — Encounter: Payer: Self-pay | Admitting: Family Medicine

## 2019-05-11 ENCOUNTER — Ambulatory Visit (INDEPENDENT_AMBULATORY_CARE_PROVIDER_SITE_OTHER): Payer: Medicare HMO | Admitting: Family Medicine

## 2019-05-11 VITALS — BP 151/71 | HR 68 | Temp 98.5°F | Ht 61.0 in | Wt 187.0 lb

## 2019-05-11 DIAGNOSIS — Z8719 Personal history of other diseases of the digestive system: Secondary | ICD-10-CM

## 2019-05-11 DIAGNOSIS — R1030 Lower abdominal pain, unspecified: Secondary | ICD-10-CM | POA: Insufficient documentation

## 2019-05-11 MED ORDER — METRONIDAZOLE 500 MG PO TABS: 500.0000 mg | ORAL_TABLET | Freq: Three times a day (TID) | ORAL | 0 refills | Status: DC

## 2019-05-11 NOTE — Progress Notes (Signed)
Virtual Visit via Telephone Note  I connected with Renee [name] on 05/11/19 at 11:15 AM EDT by telephone and verified that I am speaking with the correct person using two identifiers.  Location: Patient: home  Provider: office    I discussed the limitations, risks, security and privacy concerns of performing an evaluation and management service by telephone and the availability of in person appointments. I also discussed with the patient that there may be a patient responsible charge related to this service. The patient expressed understanding and agreed to proceed.   History of Present Illness: Here for stomach issues  Wt Readings from Last 3 Encounters:  05/11/19 187 lb (84.8 kg)  03/30/19 187 lb 8 oz (85 kg)  03/25/18 178 lb 8 oz (81 kg)   35.33 kg/m    Having stomach pains  - feels like gas  No v/d   A little nausea now and then  Pain is in lower abdomen on and off  All the way across  Having regular bms -normal for her  (once per day)  No blood   No pain when she presses on abd   No pain today- comes and goes   May be getting a little better    Ate fried oysters on Sunday a week ago  Her SIL ate the oysters and felt fine  No history of oyster allergy   Blood sugar has been a bit high (193 this am)   No appetite  She is drinking fluid   No urinary symptoms at all   No sick contacts   No fever  Gets clammy or hot and sweaty occ     Had colitis in 5/19 (? inflam vs ischemic)  Dx in ED Given cipro/flagyl and lomotil  Also gallstones  Has had hysterectomy and appendectomy in the past  Patient Active Problem List   Diagnosis Date Noted  . Lower abdominal pain 05/11/2019  . Routine general medical examination at a health care facility 03/30/2019  . External hemorrhoid 01/17/2018  . History of colitis 01/17/2018  . Generalized weakness 12/24/2016  . Diabetic retinopathy ([place]) 12/11/2016  . History of CVA (cerebrovascular accident) 12/11/2016  .  Estrogen deficiency 08/30/2015  . Encounter for Medicare annual wellness exam 04/17/2013  . Mobility impaired 06/26/2011  . History of retinal detachment 01/10/2011  . Sleep apnea 11/28/2010  . Depression with anxiety 08/25/2010  . Hemiplegia, late effect of cerebrovascular disease ([place]) 07/05/2010  . POSTHERPETIC NEURALGIA 11/09/2009  . Renal insufficiency 06/29/2008  . Chronic back pain 01/26/2008  . EDEMA 01/26/2008  . B12 deficiency 01/10/2007  . Type 2 diabetes, controlled, with retinopathy ([place]) 11/27/2006  . HLD (hyperlipidemia) 11/27/2006  . Essential hypertension 11/27/2006  . FIBROCYSTIC BREAST DISEASE 11/27/2006  . ROSACEA 11/27/2006  . OSTEOARTHRITIS 11/27/2006  . URINARY INCONTINENCE, MIXED 11/27/2006   Past Medical History:  Diagnosis Date  . Angioedema    possibly from voltaren  . Bronchopneumonia 12/11/2016  . Degenerative disc disease   . Diabetes mellitus    type II  . Hyperlipidemia   . Hypertension   . LVH (left ventricular hypertrophy)    and atrial enlargement by echo in past with nl EF  . Nasal pruritis   . Osteoarthritis   . Osteopenia   . Renal insufficiency   . Sleep apnea   . Stroke [facility]) 05/2010   Small vessel sobcortical (in [place] trial) with Dr Leonie Man, residual L hemiparesis  . Vitamin B 12 deficiency 04/08  Past Surgical History:  Procedure Laterality Date  . ABDOMINAL HYSTERECTOMY     BSO-fibroids  . APPENDECTOMY    . BACK SURGERY    . COLON SURGERY     due to punctured intestines  . EYE SURGERY     cataract extraction  . KNEE SURGERY     arthroscope  . PARS PLANA VITRECTOMY  07/31/2011   Procedure: PARS PLANA VITRECTOMY WITH 25 GAUGE;  Surgeon: [name], MD;  Location: [facility];  Service: Ophthalmology;  Laterality: Right;  REMOVAL OF SILICONE OIL AND LASER RIGHT EYE  . RETINAL DETACHMENT SURGERY  02/18/11   times 2  . SPINE SURGERY  08/09   spinal decompression surgery   Social History   Tobacco Use  . Smoking status:  Never Smoker  . Smokeless tobacco: Never Used  Substance Use Topics  . Alcohol use: No    Alcohol/week: 0.0 standard drinks  . Drug use: No   Family History  Problem Relation Age of Onset  . COPD Brother   . Cancer Sister        brain tumor with hemmorhage  . Heart disease Sister        CAD   Allergies  Allergen Reactions  . Diclofenac Sodium Other (See Comments)    REACTION: angioedema  . Pioglitazone Swelling   Current Outpatient Medications on File Prior to Visit  Medication Sig Dispense Refill  . acetaminophen (TYLENOL) 500 MG tablet Take 1,000 mg by mouth at bedtime.     Marland Kitchen albuterol (PROVENTIL HFA;VENTOLIN HFA) 108 (90 Base) MCG/ACT inhaler Inhale 2 puffs into the lungs every 4 (four) hours as needed for wheezing. 1 Inhaler 3  . ALPRAZolam (XANAX) 0.5 MG tablet TAKE 1 TABLET BY MOUTH TWICE A DAY AS NEEDED FOR ANXIETY 45 tablet 3  . amLODipine (NORVASC) 10 MG tablet Take 1 tablet (10 mg total) by mouth daily. 90 tablet 3  . aspirin 325 MG tablet Take 325 mg by mouth daily.     . beta carotene w/minerals (OCUVITE) tablet Take 1 tablet by mouth 2 (two) times daily.     . Blood Glucose Monitoring Suppl (ONE TOUCH ULTRA 2) w/Device KIT Check blood sugar once daily and as directed. Dx E11.9 1 each 0  . calcium-vitamin D (OSCAL WITH D) 500-200 MG-UNIT per tablet Take 1 tablet by mouth daily.     . Cholecalciferol (VITAMIN D) 1000 UNITS capsule Take 1,000 Units by mouth daily.     . diphenoxylate-atropine (LOMOTIL) 2.5-0.025 MG tablet Take 1 tablet by mouth 2 (two) times daily as needed for diarrhea or loose stools. 20 tablet 0  . docusate sodium (COLACE) 100 MG capsule Take 200 mg by mouth at bedtime.    . ferrous sulfate 325 (65 FE) MG tablet Take 325 mg by mouth 2 (two) times daily.    Marland Kitchen gemfibrozil (LOPID) 600 MG tablet Take 1 tablet (600 mg total) by mouth daily. 90 tablet 3  . glipiZIDE (GLUCOTROL XL) 2.5 MG 24 hr tablet Take 1 tablet (2.5 mg total) by mouth daily with  breakfast. 90 tablet 3  . guaiFENesin (ROBITUSSIN) 100 MG/5ML SOLN Take 5 mLs by mouth every 4 (four) hours as needed for cough or to loosen phlegm.    . hydrALAZINE (APRESOLINE) 100 MG tablet Take 100 mg by mouth 3 (three) times daily.     . metoprolol succinate (TOPROL-XL) 25 MG 24 hr tablet Take 0.5 tablets (12.5 mg total) by mouth 2 (two) times daily. [place]  tablet 3  . mirtazapine (REMERON) 15 MG tablet Take 0.5 tablets (7.5 mg total) by mouth at bedtime. 45 tablet 3  . Multiple Vitamin (MULTIVITAMIN) capsule Take 1 capsule by mouth daily.     Glory Rosebush Delica Lancets 37S MISC 1 Stick by Other route daily. Check blood sugar once daily and as directed. Dx E11.9 100 each 1  . ONETOUCH ULTRA test strip CHECK BLOOD SUGAR ONCE DAILY AND AS DIRECTED. DX E11.9 100 strip 1  . Polyvinyl Alcohol-Povidone (REFRESH OP) Place 1 drop into both eyes daily as needed (dry eyes).    . sertraline (ZOLOFT) 50 MG tablet Take 1 tablet (50 mg total) by mouth daily. 90 tablet 3  . trandolapril (MAVIK) 4 MG tablet Take 1 tablet (4 mg total) by mouth daily. 90 tablet 3  . vitamin B-12 (CYANOCOBALAMIN) 100 MCG tablet Take 100 mcg by mouth daily.     No current facility-administered medications on file prior to visit.    Review of Systems  Constitutional: Positive for malaise/fatigue. Negative for chills and fever.       Clammy ocassionally  HENT: Negative for sore throat.   Eyes: Negative for blurred vision, discharge and redness.  Respiratory: Negative for cough and shortness of breath.   Cardiovascular: Negative for chest pain, palpitations and leg swelling.  Gastrointestinal: Positive for abdominal pain and nausea. Negative for blood in stool, constipation, diarrhea, heartburn, melena and vomiting.  Skin: Negative for itching and rash.  Neurological: Negative for dizziness and headaches.    Observations/Objective:  t sounds like her usual self Not distressed Nl affect and cognition  Hearing impaired-had mild  difficulty hearing call Voice is not hoarse No sob or cough during interview Pt is able to press on abdomen w/o pain   Assessment and Plan: Problem List Items Addressed This Visit      Other   History of colitis   Lower abdominal pain - Primary    In setting of past h/o colitis  Will tx with flagyl and update  Increase fluids as able- adv diet when ready (bland) Watch temp and blood sugar levels  Watch for diarrhea or blood in stool (also vomiting)    Update if not starting to improve in a week or if worsening            Follow Up Instructions: In light of your past history of colitis I sent in a course of the antibiotic flagyl to take  Take it three times daily as directed Drink clear fluids and then gradually increase diet as tolerated  Watch for fever, vomiting, stool change or other new symptoms   Update if not starting to improve in a week or if worsening     I discussed the assessment and treatment plan with the patient. The patient was provided an opportunity to ask questions and all were answered. The patient agreed with the plan and demonstrated an understanding of the instructions.   The patient was advised to call back or seek an in-person evaluation if the symptoms worsen or if the condition fails to improve as anticipated.  I provided 17 minutes of non-face-to-face time during this encounter.   [name], MD

## 2019-05-11 NOTE — Patient Instructions (Signed)
In light of your past history of colitis I sent in a course of the antibiotic flagyl to take  Take it three times daily as directed Drink clear fluids and then gradually increase diet as tolerated  Watch for fever, vomiting, stool change or other new symptoms   Update if not starting to improve in a week or if worsening

## 2019-05-11 NOTE — Assessment & Plan Note (Signed)
In setting of past h/o colitis  Will tx with flagyl and update  Increase fluids as able- adv diet when ready (bland) Watch temp and blood sugar levels  Watch for diarrhea or blood in stool (also vomiting)    Update if not starting to improve in a week or if worsening

## 2019-05-14 DIAGNOSIS — Z9181 History of falling: Secondary | ICD-10-CM

## 2019-05-14 DIAGNOSIS — E7800 Pure hypercholesterolemia, unspecified: Secondary | ICD-10-CM

## 2019-05-14 DIAGNOSIS — I69352 Hemiplegia and hemiparesis following cerebral infarction affecting left dominant side: Secondary | ICD-10-CM | POA: Diagnosis not present

## 2019-05-14 DIAGNOSIS — F329 Major depressive disorder, single episode, unspecified: Secondary | ICD-10-CM

## 2019-05-14 DIAGNOSIS — E11319 Type 2 diabetes mellitus with unspecified diabetic retinopathy without macular edema: Secondary | ICD-10-CM | POA: Diagnosis not present

## 2019-05-14 DIAGNOSIS — E538 Deficiency of other specified B group vitamins: Secondary | ICD-10-CM | POA: Diagnosis not present

## 2019-05-14 DIAGNOSIS — F419 Anxiety disorder, unspecified: Secondary | ICD-10-CM

## 2019-05-14 DIAGNOSIS — N6029 Fibroadenosis of unspecified breast: Secondary | ICD-10-CM

## 2019-05-14 DIAGNOSIS — R69 Illness, unspecified: Secondary | ICD-10-CM | POA: Diagnosis not present

## 2019-05-14 DIAGNOSIS — M8580 Other specified disorders of bone density and structure, unspecified site: Secondary | ICD-10-CM

## 2019-05-14 DIAGNOSIS — N3946 Mixed incontinence: Secondary | ICD-10-CM

## 2019-05-14 DIAGNOSIS — M1990 Unspecified osteoarthritis, unspecified site: Secondary | ICD-10-CM | POA: Diagnosis not present

## 2019-05-14 DIAGNOSIS — M519 Unspecified thoracic, thoracolumbar and lumbosacral intervertebral disc disorder: Secondary | ICD-10-CM | POA: Diagnosis not present

## 2019-05-14 DIAGNOSIS — G8929 Other chronic pain: Secondary | ICD-10-CM | POA: Diagnosis not present

## 2019-05-14 DIAGNOSIS — N289 Disorder of kidney and ureter, unspecified: Secondary | ICD-10-CM

## 2019-05-14 DIAGNOSIS — I119 Hypertensive heart disease without heart failure: Secondary | ICD-10-CM | POA: Diagnosis not present

## 2019-05-14 DIAGNOSIS — G4730 Sleep apnea, unspecified: Secondary | ICD-10-CM

## 2019-05-14 DIAGNOSIS — Z7984 Long term (current) use of oral hypoglycemic drugs: Secondary | ICD-10-CM

## 2019-05-15 ENCOUNTER — Telehealth: Payer: Self-pay | Admitting: Family Medicine

## 2019-05-15 NOTE — Telephone Encounter (Signed)
Thanks- I will watch for notes in chart

## 2019-05-15 NOTE — Telephone Encounter (Signed)
Laureen Ochs ([place] sister-in-law)) left a voicemail stating that patient is not getting any better and she has a bad cough. Rollene Fare stated that the  patient does not have a fever. Mrs. Wynetta Emery stated that she does not want the patient to end up with pneumonia and her have to take her to the hospital. Mrs. Wynetta Emery requested a call back ASAP.

## 2019-05-15 NOTE — Telephone Encounter (Signed)
Pt said the abd pain has resolve, she just has gas

## 2019-05-15 NOTE — Telephone Encounter (Signed)
How are her symptoms ?   (diarrhea or constipation, abdominal pain or blood in stool?) Any fever or new symptoms? Let me know please

## 2019-05-15 NOTE — Telephone Encounter (Signed)
Is the pain in her lower abdomen better?

## 2019-05-15 NOTE — Telephone Encounter (Signed)
Pt said she's been taking abx since Monday and she has no energy and no appetite and she feels "lousy". Also sleeping issues started when she started abx she said she is having a hard time sleeping. Pt said her stools are regular, she's not constipated or having any diarrhea, she also hasn't seen any blood in stool and she hasn't had any dark tary stools. Pt said that she also has been dealing with a lot of gas as well. She hasn't had any fever and only sxs she is dealing with are the ones listed in this message no other/new sxs.   [facility]/ [address]

## 2019-05-15 NOTE — Telephone Encounter (Signed)
Best number [phone]  Pt called wanting shapale to call her Regarding rx that was called in Monday.  She wanted to know if she needs to come in for lab work

## 2019-05-15 NOTE — Telephone Encounter (Signed)
Spoke with Dr. Glori Bickers regarding Renee [name]'s message. Given pt's sxs and now a cough Dr. Glori Bickers recommended pt be seen at an UC. [facility] and told her Dr. Marliss Coots recommendations. Renee Fare advise me that pt also isn't eating or moving much and her cough is pretty bad. Renee Fare said she will tell pt what Dr. Glori Bickers recommends and see if she wants to go to an UC

## 2019-05-15 NOTE — Telephone Encounter (Signed)
Will route to Dr. Glori Bickers since I wasn't here Monday

## 2019-05-15 NOTE — Telephone Encounter (Signed)
Good- I think she should finish the abd through today and stop it tomorrow Let me know if things do not improve or if abdominal pain returns

## 2019-05-19 DIAGNOSIS — M519 Unspecified thoracic, thoracolumbar and lumbosacral intervertebral disc disorder: Secondary | ICD-10-CM | POA: Diagnosis not present

## 2019-05-19 DIAGNOSIS — E11319 Type 2 diabetes mellitus with unspecified diabetic retinopathy without macular edema: Secondary | ICD-10-CM | POA: Diagnosis not present

## 2019-05-19 DIAGNOSIS — N6029 Fibroadenosis of unspecified breast: Secondary | ICD-10-CM | POA: Diagnosis not present

## 2019-05-19 DIAGNOSIS — I69352 Hemiplegia and hemiparesis following cerebral infarction affecting left dominant side: Secondary | ICD-10-CM | POA: Diagnosis not present

## 2019-05-19 DIAGNOSIS — I119 Hypertensive heart disease without heart failure: Secondary | ICD-10-CM | POA: Diagnosis not present

## 2019-05-19 DIAGNOSIS — G8929 Other chronic pain: Secondary | ICD-10-CM | POA: Diagnosis not present

## 2019-05-19 DIAGNOSIS — R69 Illness, unspecified: Secondary | ICD-10-CM | POA: Diagnosis not present

## 2019-05-19 DIAGNOSIS — M1990 Unspecified osteoarthritis, unspecified site: Secondary | ICD-10-CM | POA: Diagnosis not present

## 2019-05-19 DIAGNOSIS — N289 Disorder of kidney and ureter, unspecified: Secondary | ICD-10-CM | POA: Diagnosis not present

## 2019-05-19 DIAGNOSIS — E538 Deficiency of other specified B group vitamins: Secondary | ICD-10-CM | POA: Diagnosis not present

## 2019-05-22 DIAGNOSIS — E11319 Type 2 diabetes mellitus with unspecified diabetic retinopathy without macular edema: Secondary | ICD-10-CM | POA: Diagnosis not present

## 2019-05-22 DIAGNOSIS — I119 Hypertensive heart disease without heart failure: Secondary | ICD-10-CM | POA: Diagnosis not present

## 2019-05-22 DIAGNOSIS — E538 Deficiency of other specified B group vitamins: Secondary | ICD-10-CM | POA: Diagnosis not present

## 2019-05-22 DIAGNOSIS — R69 Illness, unspecified: Secondary | ICD-10-CM | POA: Diagnosis not present

## 2019-05-22 DIAGNOSIS — N6029 Fibroadenosis of unspecified breast: Secondary | ICD-10-CM | POA: Diagnosis not present

## 2019-05-22 DIAGNOSIS — G8929 Other chronic pain: Secondary | ICD-10-CM | POA: Diagnosis not present

## 2019-05-22 DIAGNOSIS — M1990 Unspecified osteoarthritis, unspecified site: Secondary | ICD-10-CM | POA: Diagnosis not present

## 2019-05-22 DIAGNOSIS — I69352 Hemiplegia and hemiparesis following cerebral infarction affecting left dominant side: Secondary | ICD-10-CM | POA: Diagnosis not present

## 2019-05-22 DIAGNOSIS — M519 Unspecified thoracic, thoracolumbar and lumbosacral intervertebral disc disorder: Secondary | ICD-10-CM | POA: Diagnosis not present

## 2019-05-22 DIAGNOSIS — N289 Disorder of kidney and ureter, unspecified: Secondary | ICD-10-CM | POA: Diagnosis not present

## 2019-05-25 DIAGNOSIS — R69 Illness, unspecified: Secondary | ICD-10-CM | POA: Diagnosis not present

## 2019-05-25 DIAGNOSIS — E538 Deficiency of other specified B group vitamins: Secondary | ICD-10-CM | POA: Diagnosis not present

## 2019-05-25 DIAGNOSIS — I119 Hypertensive heart disease without heart failure: Secondary | ICD-10-CM | POA: Diagnosis not present

## 2019-05-25 DIAGNOSIS — N6029 Fibroadenosis of unspecified breast: Secondary | ICD-10-CM | POA: Diagnosis not present

## 2019-05-25 DIAGNOSIS — M519 Unspecified thoracic, thoracolumbar and lumbosacral intervertebral disc disorder: Secondary | ICD-10-CM | POA: Diagnosis not present

## 2019-05-25 DIAGNOSIS — E11319 Type 2 diabetes mellitus with unspecified diabetic retinopathy without macular edema: Secondary | ICD-10-CM | POA: Diagnosis not present

## 2019-05-25 DIAGNOSIS — I69352 Hemiplegia and hemiparesis following cerebral infarction affecting left dominant side: Secondary | ICD-10-CM | POA: Diagnosis not present

## 2019-05-25 DIAGNOSIS — M1990 Unspecified osteoarthritis, unspecified site: Secondary | ICD-10-CM | POA: Diagnosis not present

## 2019-05-25 DIAGNOSIS — G8929 Other chronic pain: Secondary | ICD-10-CM | POA: Diagnosis not present

## 2019-05-25 DIAGNOSIS — N289 Disorder of kidney and ureter, unspecified: Secondary | ICD-10-CM | POA: Diagnosis not present

## 2019-05-26 ENCOUNTER — Telehealth: Payer: Self-pay | Admitting: Family Medicine

## 2019-05-26 NOTE — Telephone Encounter (Signed)
Pt is asking about pneumonia shot and shingles vaccine? I explained she need to check with insurance on coverage for shingles. Pt is wanting a call back. She has an appt for flu shot on 06/02/19 and wants to get the vaccines if possible.  CB [phone]

## 2019-05-27 NOTE — Telephone Encounter (Signed)
Pt notified of Dr. Marliss Coots comments and instructions and verbalized understanding. She will keep track of BS and update [place] if they stay elevated

## 2019-05-27 NOTE — Telephone Encounter (Signed)
She is up to date on pneumonia vaccines

## 2019-05-27 NOTE — Telephone Encounter (Signed)
I know she has to get shingles vaccine at pharmacy I will advise pt of that when I call but message sent to PCP to see if she needs any other pneumonia vaccines given her age

## 2019-05-27 NOTE — Telephone Encounter (Signed)
Pt advised she is update to date on and she will call pharmacy to get shingles vaccine if it's covered.  Pt did want me to ask Dr. Glori Bickers another question. She said that her blood sugar was 169 fasting this morning when she 1st checked it. Pt said she hasn't checked it anymore since this morning. Pt said she ate green beans and a sweet potato and jello for dinner, and then she drank 1/2 of an ensure before bed. She doesn't feel bad at all she has no sxs she said she feels fine and like her normal self. Pt wanted to know what should she do regarding high blood sugar

## 2019-05-27 NOTE — Telephone Encounter (Signed)
Lab Results  Component Value Date   HGBA1C 6.0 03/30/2019   Last A1C was great  Glucose of 169 is higher than usual for her but not severely high at all  May be a fluke Keep track of the ups and downs  Stick with a diabetic diet  If glucose stays high over the next several weeks (checking at different times) please let me know

## 2019-05-29 DIAGNOSIS — N289 Disorder of kidney and ureter, unspecified: Secondary | ICD-10-CM | POA: Diagnosis not present

## 2019-05-29 DIAGNOSIS — E538 Deficiency of other specified B group vitamins: Secondary | ICD-10-CM | POA: Diagnosis not present

## 2019-05-29 DIAGNOSIS — I69352 Hemiplegia and hemiparesis following cerebral infarction affecting left dominant side: Secondary | ICD-10-CM | POA: Diagnosis not present

## 2019-05-29 DIAGNOSIS — I119 Hypertensive heart disease without heart failure: Secondary | ICD-10-CM | POA: Diagnosis not present

## 2019-05-29 DIAGNOSIS — R69 Illness, unspecified: Secondary | ICD-10-CM | POA: Diagnosis not present

## 2019-05-29 DIAGNOSIS — G8929 Other chronic pain: Secondary | ICD-10-CM | POA: Diagnosis not present

## 2019-05-29 DIAGNOSIS — N6029 Fibroadenosis of unspecified breast: Secondary | ICD-10-CM | POA: Diagnosis not present

## 2019-05-29 DIAGNOSIS — M1990 Unspecified osteoarthritis, unspecified site: Secondary | ICD-10-CM | POA: Diagnosis not present

## 2019-05-29 DIAGNOSIS — E11319 Type 2 diabetes mellitus with unspecified diabetic retinopathy without macular edema: Secondary | ICD-10-CM | POA: Diagnosis not present

## 2019-05-29 DIAGNOSIS — M519 Unspecified thoracic, thoracolumbar and lumbosacral intervertebral disc disorder: Secondary | ICD-10-CM | POA: Diagnosis not present

## 2019-06-01 DIAGNOSIS — R69 Illness, unspecified: Secondary | ICD-10-CM | POA: Diagnosis not present

## 2019-06-02 ENCOUNTER — Ambulatory Visit (INDEPENDENT_AMBULATORY_CARE_PROVIDER_SITE_OTHER): Payer: Medicare HMO

## 2019-06-02 DIAGNOSIS — Z23 Encounter for immunization: Secondary | ICD-10-CM

## 2019-06-04 DIAGNOSIS — N6029 Fibroadenosis of unspecified breast: Secondary | ICD-10-CM | POA: Diagnosis not present

## 2019-06-04 DIAGNOSIS — M519 Unspecified thoracic, thoracolumbar and lumbosacral intervertebral disc disorder: Secondary | ICD-10-CM | POA: Diagnosis not present

## 2019-06-04 DIAGNOSIS — G8929 Other chronic pain: Secondary | ICD-10-CM | POA: Diagnosis not present

## 2019-06-04 DIAGNOSIS — E538 Deficiency of other specified B group vitamins: Secondary | ICD-10-CM | POA: Diagnosis not present

## 2019-06-04 DIAGNOSIS — M1990 Unspecified osteoarthritis, unspecified site: Secondary | ICD-10-CM | POA: Diagnosis not present

## 2019-06-04 DIAGNOSIS — I119 Hypertensive heart disease without heart failure: Secondary | ICD-10-CM | POA: Diagnosis not present

## 2019-06-04 DIAGNOSIS — N289 Disorder of kidney and ureter, unspecified: Secondary | ICD-10-CM | POA: Diagnosis not present

## 2019-06-04 DIAGNOSIS — I69352 Hemiplegia and hemiparesis following cerebral infarction affecting left dominant side: Secondary | ICD-10-CM | POA: Diagnosis not present

## 2019-06-04 DIAGNOSIS — E11319 Type 2 diabetes mellitus with unspecified diabetic retinopathy without macular edema: Secondary | ICD-10-CM | POA: Diagnosis not present

## 2019-06-04 DIAGNOSIS — R69 Illness, unspecified: Secondary | ICD-10-CM | POA: Diagnosis not present

## 2019-06-08 DIAGNOSIS — E538 Deficiency of other specified B group vitamins: Secondary | ICD-10-CM | POA: Diagnosis not present

## 2019-06-08 DIAGNOSIS — I119 Hypertensive heart disease without heart failure: Secondary | ICD-10-CM | POA: Diagnosis not present

## 2019-06-08 DIAGNOSIS — N289 Disorder of kidney and ureter, unspecified: Secondary | ICD-10-CM | POA: Diagnosis not present

## 2019-06-08 DIAGNOSIS — M1990 Unspecified osteoarthritis, unspecified site: Secondary | ICD-10-CM | POA: Diagnosis not present

## 2019-06-08 DIAGNOSIS — M519 Unspecified thoracic, thoracolumbar and lumbosacral intervertebral disc disorder: Secondary | ICD-10-CM | POA: Diagnosis not present

## 2019-06-08 DIAGNOSIS — G8929 Other chronic pain: Secondary | ICD-10-CM | POA: Diagnosis not present

## 2019-06-08 DIAGNOSIS — I69352 Hemiplegia and hemiparesis following cerebral infarction affecting left dominant side: Secondary | ICD-10-CM | POA: Diagnosis not present

## 2019-06-08 DIAGNOSIS — N6029 Fibroadenosis of unspecified breast: Secondary | ICD-10-CM | POA: Diagnosis not present

## 2019-06-08 DIAGNOSIS — R69 Illness, unspecified: Secondary | ICD-10-CM | POA: Diagnosis not present

## 2019-06-08 DIAGNOSIS — E11319 Type 2 diabetes mellitus with unspecified diabetic retinopathy without macular edema: Secondary | ICD-10-CM | POA: Diagnosis not present

## 2019-06-09 DIAGNOSIS — R69 Illness, unspecified: Secondary | ICD-10-CM | POA: Diagnosis not present

## 2019-06-15 ENCOUNTER — Other Ambulatory Visit: Payer: Self-pay | Admitting: Family Medicine

## 2019-06-16 ENCOUNTER — Encounter (INDEPENDENT_AMBULATORY_CARE_PROVIDER_SITE_OTHER): Payer: Self-pay

## 2019-06-17 DIAGNOSIS — N6029 Fibroadenosis of unspecified breast: Secondary | ICD-10-CM | POA: Diagnosis not present

## 2019-06-17 DIAGNOSIS — G8929 Other chronic pain: Secondary | ICD-10-CM | POA: Diagnosis not present

## 2019-06-17 DIAGNOSIS — N289 Disorder of kidney and ureter, unspecified: Secondary | ICD-10-CM | POA: Diagnosis not present

## 2019-06-17 DIAGNOSIS — M519 Unspecified thoracic, thoracolumbar and lumbosacral intervertebral disc disorder: Secondary | ICD-10-CM | POA: Diagnosis not present

## 2019-06-17 DIAGNOSIS — R69 Illness, unspecified: Secondary | ICD-10-CM | POA: Diagnosis not present

## 2019-06-17 DIAGNOSIS — E11319 Type 2 diabetes mellitus with unspecified diabetic retinopathy without macular edema: Secondary | ICD-10-CM | POA: Diagnosis not present

## 2019-06-17 DIAGNOSIS — I119 Hypertensive heart disease without heart failure: Secondary | ICD-10-CM | POA: Diagnosis not present

## 2019-06-17 DIAGNOSIS — E538 Deficiency of other specified B group vitamins: Secondary | ICD-10-CM | POA: Diagnosis not present

## 2019-06-17 DIAGNOSIS — I69352 Hemiplegia and hemiparesis following cerebral infarction affecting left dominant side: Secondary | ICD-10-CM | POA: Diagnosis not present

## 2019-06-17 DIAGNOSIS — M1990 Unspecified osteoarthritis, unspecified site: Secondary | ICD-10-CM | POA: Diagnosis not present

## 2019-06-18 DIAGNOSIS — R809 Proteinuria, unspecified: Secondary | ICD-10-CM | POA: Diagnosis not present

## 2019-06-18 DIAGNOSIS — R600 Localized edema: Secondary | ICD-10-CM | POA: Diagnosis not present

## 2019-06-18 DIAGNOSIS — N2581 Secondary hyperparathyroidism of renal origin: Secondary | ICD-10-CM | POA: Diagnosis not present

## 2019-06-18 DIAGNOSIS — N1832 Chronic kidney disease, stage 3b: Secondary | ICD-10-CM | POA: Diagnosis not present

## 2019-06-30 ENCOUNTER — Inpatient Hospital Stay (HOSPITAL_COMMUNITY)
Admission: EM | Admit: 2019-06-30 | Discharge: 2019-07-08 | DRG: 871 | Disposition: A | Discharge status: Home Health | Payer: Medicare HMO | Attending: Internal Medicine | Admitting: Internal Medicine

## 2019-06-30 ENCOUNTER — Emergency Department (HOSPITAL_COMMUNITY): Payer: Medicare HMO

## 2019-06-30 ENCOUNTER — Other Ambulatory Visit: Payer: Self-pay

## 2019-06-30 ENCOUNTER — Encounter (HOSPITAL_COMMUNITY): Payer: Self-pay

## 2019-06-30 DIAGNOSIS — I10 Essential (primary) hypertension: Secondary | ICD-10-CM | POA: Diagnosis present

## 2019-06-30 DIAGNOSIS — Z90710 Acquired absence of both cervix and uterus: Secondary | ICD-10-CM

## 2019-06-30 DIAGNOSIS — R0902 Hypoxemia: Secondary | ICD-10-CM | POA: Diagnosis not present

## 2019-06-30 DIAGNOSIS — E1122 Type 2 diabetes mellitus with diabetic chronic kidney disease: Secondary | ICD-10-CM | POA: Diagnosis present

## 2019-06-30 DIAGNOSIS — Z79899 Other long term (current) drug therapy: Secondary | ICD-10-CM

## 2019-06-30 DIAGNOSIS — N390 Urinary tract infection, site not specified: Secondary | ICD-10-CM | POA: Diagnosis present

## 2019-06-30 DIAGNOSIS — N289 Disorder of kidney and ureter, unspecified: Secondary | ICD-10-CM | POA: Diagnosis not present

## 2019-06-30 DIAGNOSIS — Z825 Family history of asthma and other chronic lower respiratory diseases: Secondary | ICD-10-CM

## 2019-06-30 DIAGNOSIS — R112 Nausea with vomiting, unspecified: Secondary | ICD-10-CM | POA: Diagnosis not present

## 2019-06-30 DIAGNOSIS — I69354 Hemiplegia and hemiparesis following cerebral infarction affecting left non-dominant side: Secondary | ICD-10-CM

## 2019-06-30 DIAGNOSIS — R05 Cough: Secondary | ICD-10-CM | POA: Diagnosis not present

## 2019-06-30 DIAGNOSIS — E538 Deficiency of other specified B group vitamins: Secondary | ICD-10-CM | POA: Diagnosis present

## 2019-06-30 DIAGNOSIS — E1165 Type 2 diabetes mellitus with hyperglycemia: Secondary | ICD-10-CM | POA: Diagnosis not present

## 2019-06-30 DIAGNOSIS — Z888 Allergy status to other drugs, medicaments and biological substances status: Secondary | ICD-10-CM

## 2019-06-30 DIAGNOSIS — N184 Chronic kidney disease, stage 4 (severe): Secondary | ICD-10-CM | POA: Diagnosis present

## 2019-06-30 DIAGNOSIS — R509 Fever, unspecified: Secondary | ICD-10-CM | POA: Diagnosis not present

## 2019-06-30 DIAGNOSIS — Z9079 Acquired absence of other genital organ(s): Secondary | ICD-10-CM

## 2019-06-30 DIAGNOSIS — J189 Pneumonia, unspecified organism: Secondary | ICD-10-CM | POA: Diagnosis not present

## 2019-06-30 DIAGNOSIS — Z20828 Contact with and (suspected) exposure to other viral communicable diseases: Secondary | ICD-10-CM | POA: Diagnosis present

## 2019-06-30 DIAGNOSIS — E785 Hyperlipidemia, unspecified: Secondary | ICD-10-CM | POA: Diagnosis present

## 2019-06-30 DIAGNOSIS — Z8673 Personal history of transient ischemic attack (TIA), and cerebral infarction without residual deficits: Secondary | ICD-10-CM

## 2019-06-30 DIAGNOSIS — Z8249 Family history of ischemic heart disease and other diseases of the circulatory system: Secondary | ICD-10-CM

## 2019-06-30 DIAGNOSIS — Z7984 Long term (current) use of oral hypoglycemic drugs: Secondary | ICD-10-CM

## 2019-06-30 DIAGNOSIS — R7989 Other specified abnormal findings of blood chemistry: Secondary | ICD-10-CM

## 2019-06-30 DIAGNOSIS — Z90722 Acquired absence of ovaries, bilateral: Secondary | ICD-10-CM

## 2019-06-30 DIAGNOSIS — A4151 Sepsis due to Escherichia coli [E. coli]: Principal | ICD-10-CM | POA: Diagnosis present

## 2019-06-30 DIAGNOSIS — Z7982 Long term (current) use of aspirin: Secondary | ICD-10-CM

## 2019-06-30 DIAGNOSIS — R0602 Shortness of breath: Secondary | ICD-10-CM | POA: Diagnosis not present

## 2019-06-30 DIAGNOSIS — E11319 Type 2 diabetes mellitus with unspecified diabetic retinopathy without macular edema: Secondary | ICD-10-CM | POA: Diagnosis present

## 2019-06-30 DIAGNOSIS — B9689 Other specified bacterial agents as the cause of diseases classified elsewhere: Secondary | ICD-10-CM | POA: Diagnosis present

## 2019-06-30 DIAGNOSIS — Z8619 Personal history of other infectious and parasitic diseases: Secondary | ICD-10-CM | POA: Diagnosis present

## 2019-06-30 DIAGNOSIS — I129 Hypertensive chronic kidney disease with stage 1 through stage 4 chronic kidney disease, or unspecified chronic kidney disease: Secondary | ICD-10-CM | POA: Diagnosis present

## 2019-06-30 DIAGNOSIS — G4730 Sleep apnea, unspecified: Secondary | ICD-10-CM | POA: Diagnosis present

## 2019-06-30 DIAGNOSIS — R0689 Other abnormalities of breathing: Secondary | ICD-10-CM | POA: Diagnosis not present

## 2019-06-30 LAB — PROTIME-INR
INR: 1 (ref 0.8–1.2)
Prothrombin Time: 12.9 seconds (ref 11.4–15.2)

## 2019-06-30 LAB — CBC WITH DIFFERENTIAL/PLATELET
Abs Immature Granulocytes: 0.07 10*3/uL (ref 0.00–0.07)
Basophils Absolute: 0.1 10*3/uL (ref 0.0–0.1)
Basophils Relative: 0 %
Eosinophils Absolute: 0 10*3/uL (ref 0.0–0.5)
Eosinophils Relative: 0 %
HCT: 39.8 % (ref 36.0–46.0)
Hemoglobin: 12.3 g/dL (ref 12.0–15.0)
Immature Granulocytes: 0 %
Lymphocytes Relative: 3 %
Lymphs Abs: 0.5 10*3/uL — ABNORMAL LOW (ref 0.7–4.0)
MCH: 28.3 pg (ref 26.0–34.0)
MCHC: 30.9 g/dL (ref 30.0–36.0)
MCV: 91.5 fL (ref 80.0–100.0)
Monocytes Absolute: 0.7 10*3/uL (ref 0.1–1.0)
Monocytes Relative: 4 %
Neutro Abs: 16.3 10*3/uL — ABNORMAL HIGH (ref 1.7–7.7)
Neutrophils Relative %: 93 %
Platelets: 197 10*3/uL (ref 150–400)
RBC: 4.35 MIL/uL (ref 3.87–5.11)
RDW: 13.5 % (ref 11.5–15.5)
WBC: 17.6 10*3/uL — ABNORMAL HIGH (ref 4.0–10.5)
nRBC: 0 % (ref 0.0–0.2)

## 2019-06-30 LAB — COMPREHENSIVE METABOLIC PANEL
ALT: 13 U/L (ref 0–44)
AST: 21 U/L (ref 15–41)
Albumin: 3.7 g/dL (ref 3.5–5.0)
Alkaline Phosphatase: 90 U/L (ref 38–126)
Anion gap: 12 (ref 5–15)
BUN: 28 mg/dL — ABNORMAL HIGH (ref 8–23)
CO2: 22 mmol/L (ref 22–32)
Calcium: 9 mg/dL (ref 8.9–10.3)
Chloride: 104 mmol/L (ref 98–111)
Creatinine, Ser: 1.59 mg/dL — ABNORMAL HIGH (ref 0.44–1.00)
GFR calc Af Amer: 33 mL/min — ABNORMAL LOW (ref >60)
GFR calc non Af Amer: 29 mL/min — ABNORMAL LOW (ref >60)
Glucose, Bld: 307 mg/dL — ABNORMAL HIGH (ref 70–99)
Potassium: 4.2 mmol/L (ref 3.5–5.1)
Sodium: 138 mmol/L (ref 135–145)
Total Bilirubin: 0.5 mg/dL (ref 0.3–1.2)
Total Protein: 6 g/dL — ABNORMAL LOW (ref 6.5–8.1)

## 2019-06-30 LAB — LIPASE, BLOOD: Lipase: 20 U/L (ref 11–51)

## 2019-06-30 MED ORDER — ACETAMINOPHEN 500 MG PO TABS
1000.0000 mg | ORAL_TABLET | Freq: Once | ORAL | Status: AC
  Administered 2019-06-30: 1000 mg via ORAL
  Filled 2019-06-30: qty 2

## 2019-06-30 NOTE — ED Triage Notes (Signed)
Pt presents to ED via EMS for fever, N/V that began today at 5p. Pt caregiver called EMS, stays with pt 6hrs out of day. Lives at home alone.  EMS exam: increased work of breathing with possible diminished lung sounds in lower lobes, febrile (100F), 210/100, P90bpm, RR16, CBG299, spO2 92RA, pain 0  H/o htn, dm, anxiety

## 2019-06-30 NOTE — ED Triage Notes (Signed)
Pt arrived via [facility]; pt from [facility] with c/o N/V; enroute pt c/o cough and nasal congestion; pt attempted to eat oysters at 6p and vomited food back up; pt had a temp of 100.0; 91% on RA; pt placed on 3L181/88;cbg 299; 30, 100, 98.0

## 2019-06-30 NOTE — ED Provider Notes (Signed)
[place] EMERGENCY DEPARTMENT Provider Note   CSN: [id number] Arrival date & time: 06/30/19  2100     History   Chief Complaint Chief Complaint  Patient presents with   Fever    HPI Renee [name] is a 85 y.o. female.     85 year old female with prior medical history as detailed below presents for evaluation of nausea, vomiting, malaise, and fatigue.  Patient reports onset of symptoms at approximately 5 PM.  She reports acute onset of nausea and emesis shortly after a BM.  She is now complaining of feeling weak and fatigued.  She denies any associated chest pain or shortness of breath.  She denies cough.  She denies known exposure to patient with Covid.  She denies belly pain.    The history is provided by the patient and medical records.  Illness Location:  Nausea, vomiting, malaise, fatigue  Severity:  Moderate Onset quality:  Sudden Duration:  4 hours Timing:  Constant Progression:  Worsening Chronicity:  New Associated symptoms: fever     Past Medical History:  Diagnosis Date   Angioedema    possibly from voltaren   Bronchopneumonia 12/11/2016   Degenerative disc disease    Diabetes mellitus    type II   Hyperlipidemia    Hypertension    LVH (left ventricular hypertrophy)    and atrial enlargement by echo in past with nl EF   Nasal pruritis    Osteoarthritis    Osteopenia    Renal insufficiency    Sleep apnea    Stroke ([place]) 05/2010   Small vessel sobcortical (in [place]) with Dr Leonie Man, residual L hemiparesis   Vitamin B 12 deficiency 04/08    Patient Active Problem List   Diagnosis Date Noted   Lower abdominal pain 05/11/2019   Routine general medical examination at a health care facility 03/30/2019   External hemorrhoid 01/17/2018   History of colitis 01/17/2018   Generalized weakness 12/24/2016   Diabetic retinopathy ([place]) 12/11/2016   History of CVA (cerebrovascular accident) 12/11/2016    Estrogen deficiency 08/30/2015   Encounter for Medicare annual wellness exam 04/17/2013   Mobility impaired 06/26/2011   History of retinal detachment 01/10/2011   Sleep apnea 11/28/2010   Depression with anxiety 08/25/2010   Hemiplegia, late effect of cerebrovascular disease ([place]) 07/05/2010   POSTHERPETIC NEURALGIA 11/09/2009   Renal insufficiency 06/29/2008   Chronic back pain 01/26/2008   EDEMA 01/26/2008   B12 deficiency 01/10/2007   Type 2 diabetes, controlled, with retinopathy ([place]) 11/27/2006   HLD (hyperlipidemia) 11/27/2006   Essential hypertension 11/27/2006   FIBROCYSTIC BREAST DISEASE 11/27/2006   ROSACEA 11/27/2006   OSTEOARTHRITIS 11/27/2006   URINARY INCONTINENCE, MIXED 11/27/2006    Past Surgical History:  Procedure Laterality Date   ABDOMINAL HYSTERECTOMY     BSO-fibroids   APPENDECTOMY     BACK SURGERY     COLON SURGERY     due to punctured intestines   EYE SURGERY     cataract extraction   KNEE SURGERY     arthroscope   PARS PLANA VITRECTOMY  07/31/2011   Procedure: PARS PLANA VITRECTOMY WITH 25 GAUGE;  Surgeon: [name], MD;  Location: [facility];  Service: Ophthalmology;  Laterality: Right;  REMOVAL OF SILICONE OIL AND LASER RIGHT EYE   RETINAL DETACHMENT SURGERY  02/18/11   times 2   SPINE SURGERY  08/09   spinal decompression surgery     OB History   No obstetric history  on file.      Home Medications    Prior to Admission medications   Medication Sig Start Date End Date Taking? Authorizing Provider  acetaminophen (TYLENOL) 500 MG tablet Take 1,000 mg by mouth at bedtime.     [provider]  albuterol (PROVENTIL HFA;VENTOLIN HFA) 108 (90 Base) MCG/ACT inhaler Inhale 2 puffs into the lungs every 4 (four) hours as needed for wheezing. 12/07/16   Tower, Wynelle Fanny, MD  ALPRAZolam [name]) 0.5 MG tablet TAKE 1 TABLET BY MOUTH TWICE A DAY AS NEEDED FOR ANXIETY 04/21/19   Tower, Wynelle Fanny, MD  amLODipine (NORVASC) 10  MG tablet Take 1 tablet (10 mg total) by mouth daily. 09/30/18   Tower, Wynelle Fanny, MD  aspirin 325 MG tablet Take 325 mg by mouth daily.     [provider]  beta carotene w/minerals (OCUVITE) tablet Take 1 tablet by mouth 2 (two) times daily.     [provider]  Blood Glucose Monitoring Suppl (ONE TOUCH ULTRA 2) w/Device KIT Check blood sugar once daily and as directed. Dx E11.9 08/27/18   Tower, Wynelle Fanny, MD  calcium-vitamin D (OSCAL WITH D) 500-200 MG-UNIT per tablet Take 1 tablet by mouth daily.     [provider]  Cholecalciferol (VITAMIN D) 1000 UNITS capsule Take 1,000 Units by mouth daily.     [provider]  diphenoxylate-atropine (LOMOTIL) 2.5-0.025 MG tablet Take 1 tablet by mouth 2 (two) times daily as needed for diarrhea or loose stools. 01/15/18   Tower, Wynelle Fanny, MD  docusate sodium (COLACE) 100 MG capsule Take 200 mg by mouth at bedtime.    [provider]  ferrous sulfate 325 (65 FE) MG tablet Take 325 mg by mouth 2 (two) times daily.    [provider]  gemfibrozil (LOPID) 600 MG tablet Take 1 tablet (600 mg total) by mouth daily. 09/30/18   Tower, Wynelle Fanny, MD  glipiZIDE (GLUCOTROL XL) 2.5 MG 24 hr tablet Take 1 tablet (2.5 mg total) by mouth daily with breakfast. 09/30/18   Tower, Wynelle Fanny, MD  guaiFENesin (ROBITUSSIN) 100 MG/5ML SOLN Take 5 mLs by mouth every 4 (four) hours as needed for cough or to loosen phlegm.    [provider]  hydrALAZINE (APRESOLINE) 100 MG tablet Take 100 mg by mouth 3 (three) times daily.  08/30/14   [provider]  metoprolol succinate (TOPROL-XL) 25 MG 24 hr tablet Take 0.5 tablets (12.5 mg total) by mouth 2 (two) times daily. 09/30/18   Tower, Wynelle Fanny, MD  metroNIDAZOLE (FLAGYL) 500 MG tablet Take 1 tablet (500 mg total) by mouth 3 (three) times daily. 05/11/19   Tower, Wynelle Fanny, MD  mirtazapine (REMERON) 15 MG tablet TAKE 1/2 TABLET BY MOUTH AT BEDTIME 06/16/19   Tower, Wynelle Fanny, MD    Multiple Vitamin (MULTIVITAMIN) capsule Take 1 capsule by mouth daily.     [provider]  OneTouch Delica Lancets 99M MISC 1 Stick by Other route daily. Check blood sugar once daily and as directed. Dx E11.9 03/13/19   Tower, Wynelle Fanny, MD  ONETOUCH ULTRA test strip CHECK BLOOD SUGAR ONCE DAILY AND AS DIRECTED. DX E11.9 03/10/19   Tower, Wynelle Fanny, MD  Polyvinyl Alcohol-Povidone (REFRESH OP) Place 1 drop into both eyes daily as needed (dry eyes).    [provider]  sertraline (ZOLOFT) 50 MG tablet Take 1 tablet (50 mg total) by mouth daily. 09/30/18   Tower, Wynelle Fanny, MD  trandolapril ([place])  4 MG tablet Take 1 tablet (4 mg total) by mouth daily. 09/30/18   Tower, Wynelle Fanny, MD  vitamin B-12 (CYANOCOBALAMIN) 100 MCG tablet Take 100 mcg by mouth daily.    [provider]    Family History Family History  Problem Relation Age of Onset   COPD Brother    Cancer Sister        brain tumor with hemmorhage   Heart disease Sister        CAD    Social History Social History   Tobacco Use   Smoking status: Never Smoker   Smokeless tobacco: Never Used  Substance Use Topics   Alcohol use: No    Alcohol/week: 0.0 standard drinks   Drug use: No     Allergies   Diclofenac sodium and Pioglitazone   Review of Systems Review of Systems  Constitutional: Positive for fever.  All other systems reviewed and are negative.    Physical Exam Updated Vital Signs BP (!) 164/76    Pulse 94    Temp (!) 101.1 F (38.4 C) (Rectal)    Resp (!) 21    SpO2 94%   Physical Exam Vitals signs and nursing note reviewed.  Constitutional:      General: She is not in acute distress.    Appearance: Normal appearance. She is well-developed.  HENT:     Head: Normocephalic and atraumatic.  Eyes:     Conjunctiva/sclera: Conjunctivae normal.     Pupils: Pupils are equal, round, and reactive to light.  Neck:     Musculoskeletal: Normal range of motion and neck supple.   Cardiovascular:     Rate and Rhythm: Normal rate and regular rhythm.     Heart sounds: Normal heart sounds.  Pulmonary:     Effort: Pulmonary effort is normal. No respiratory distress.     Breath sounds: Normal breath sounds.  Abdominal:     General: There is no distension.     Palpations: Abdomen is soft.     Tenderness: There is no abdominal tenderness.  Musculoskeletal: Normal range of motion.        General: No deformity.  Skin:    General: Skin is warm and dry.  Neurological:     General: No focal deficit present.     Mental Status: She is alert and oriented to person, place, and time. Mental status is at baseline.      ED Treatments / Results  Labs (all labs ordered are listed, but only abnormal results are displayed) Labs Reviewed  COMPREHENSIVE METABOLIC PANEL - Abnormal; Notable for the following components:      Result Value   Glucose, Bld 307 (*)    BUN 28 (*)    Creatinine, Ser 1.59 (*)    Total Protein 6.0 (*)    GFR calc non Af Amer 29 (*)    GFR calc Af Amer 33 (*)    All other components within normal limits  CBC WITH DIFFERENTIAL/PLATELET - Abnormal; Notable for the following components:   WBC 17.6 (*)    Neutro Abs 16.3 (*)    Lymphs Abs 0.5 (*)    All other components within normal limits  SARS CORONAVIRUS 2 (TAT 6-24 HRS)  CULTURE, BLOOD (ROUTINE X 2)  CULTURE, BLOOD (ROUTINE X 2)  PROTIME-INR  LIPASE, BLOOD  URINALYSIS, ROUTINE W REFLEX MICROSCOPIC  LACTIC ACID, PLASMA  LACTIC ACID, PLASMA    EKG None  Radiology Dg Chest Port 1 View  Result Date: 06/30/2019  CLINICAL DATA:  Cough and fever EXAM: PORTABLE CHEST 1 VIEW COMPARISON:  12/11/2016 FINDINGS: Mild cardiomegaly is again seen but stable. The lungs are well aerated bilaterally. No focal infiltrate or sizable effusion is seen. Mild central vascular congestion is noted with early interstitial edema. IMPRESSION: Mild changes of congestive failure. Electronically Signed   By: [name]  M.D.   On: 06/30/2019 21:50    Procedures Procedures (including critical care time)  Medications Ordered in ED Medications - No data to display   Initial Impression / Assessment and Plan / ED Course  I have reviewed the triage vital signs and the nursing notes.  Pertinent labs & imaging results that were available during my care of the patient were reviewed by me and considered in my medical decision making (see chart for details).        MDM  Screen complete  Renee [name] was evaluated in Emergency Department on 06/30/2019 for the symptoms described in the history of present illness. She was evaluated in the context of the global COVID-19 pandemic, which necessitated consideration that the patient might be at risk for infection with the SARS-CoV-2 virus that causes COVID-19. Institutional protocols and algorithms that pertain to the evaluation of patients at risk for COVID-19 are in a state of rapid change based on information released by regulatory bodies including the CDC and federal and state organizations. These policies and algorithms were followed during the patient's care in the ED.  Patient presenting for evaluation reported nausea, vomiting, malaise, fatigue, and subjective fever.  Symptoms started earlier this evening around 5 PM.  Screening labs to be obtained.  Will obtain CT imaging of the abdomen pelvis given patient's reported history of colitis and current symptoms.   COVID remains on the differential.   Dr. Roxanne Mins aware of pending studies and disposition.   Final Clinical Impressions(s) / ED Diagnoses   Final diagnoses:  Fever, unspecified fever cause  Nausea and vomiting, intractability of vomiting not specified, unspecified vomiting type    ED Discharge Orders    None       Valarie Merino, MD 06/30/19 [id number]

## 2019-06-30 NOTE — ED Notes (Signed)
Laureen Ochs [phone], sister in law. Wondering if she can come back with the patient as well as an update on patient

## 2019-07-01 ENCOUNTER — Emergency Department (HOSPITAL_COMMUNITY): Payer: Medicare HMO

## 2019-07-01 ENCOUNTER — Encounter (HOSPITAL_COMMUNITY): Payer: Self-pay | Admitting: Radiology

## 2019-07-01 DIAGNOSIS — Z20828 Contact with and (suspected) exposure to other viral communicable diseases: Secondary | ICD-10-CM | POA: Diagnosis not present

## 2019-07-01 DIAGNOSIS — I129 Hypertensive chronic kidney disease with stage 1 through stage 4 chronic kidney disease, or unspecified chronic kidney disease: Secondary | ICD-10-CM | POA: Diagnosis not present

## 2019-07-01 DIAGNOSIS — J189 Pneumonia, unspecified organism: Secondary | ICD-10-CM | POA: Diagnosis not present

## 2019-07-01 DIAGNOSIS — E785 Hyperlipidemia, unspecified: Secondary | ICD-10-CM | POA: Diagnosis not present

## 2019-07-01 DIAGNOSIS — R1111 Vomiting without nausea: Secondary | ICD-10-CM | POA: Diagnosis not present

## 2019-07-01 DIAGNOSIS — N184 Chronic kidney disease, stage 4 (severe): Secondary | ICD-10-CM | POA: Diagnosis not present

## 2019-07-01 DIAGNOSIS — A419 Sepsis, unspecified organism: Secondary | ICD-10-CM | POA: Diagnosis not present

## 2019-07-01 DIAGNOSIS — Z8619 Personal history of other infectious and parasitic diseases: Secondary | ICD-10-CM | POA: Diagnosis present

## 2019-07-01 DIAGNOSIS — Z888 Allergy status to other drugs, medicaments and biological substances status: Secondary | ICD-10-CM | POA: Diagnosis not present

## 2019-07-01 DIAGNOSIS — I69354 Hemiplegia and hemiparesis following cerebral infarction affecting left non-dominant side: Secondary | ICD-10-CM | POA: Diagnosis not present

## 2019-07-01 DIAGNOSIS — A4151 Sepsis due to Escherichia coli [E. coli]: Secondary | ICD-10-CM | POA: Diagnosis not present

## 2019-07-01 DIAGNOSIS — Z825 Family history of asthma and other chronic lower respiratory diseases: Secondary | ICD-10-CM | POA: Diagnosis not present

## 2019-07-01 DIAGNOSIS — Z7984 Long term (current) use of oral hypoglycemic drugs: Secondary | ICD-10-CM | POA: Diagnosis not present

## 2019-07-01 DIAGNOSIS — B9689 Other specified bacterial agents as the cause of diseases classified elsewhere: Secondary | ICD-10-CM | POA: Diagnosis present

## 2019-07-01 DIAGNOSIS — Z7982 Long term (current) use of aspirin: Secondary | ICD-10-CM | POA: Diagnosis not present

## 2019-07-01 DIAGNOSIS — K5730 Diverticulosis of large intestine without perforation or abscess without bleeding: Secondary | ICD-10-CM | POA: Diagnosis not present

## 2019-07-01 DIAGNOSIS — N390 Urinary tract infection, site not specified: Secondary | ICD-10-CM | POA: Diagnosis not present

## 2019-07-01 DIAGNOSIS — Z9079 Acquired absence of other genital organ(s): Secondary | ICD-10-CM | POA: Diagnosis not present

## 2019-07-01 DIAGNOSIS — G4730 Sleep apnea, unspecified: Secondary | ICD-10-CM | POA: Diagnosis not present

## 2019-07-01 DIAGNOSIS — Z79899 Other long term (current) drug therapy: Secondary | ICD-10-CM | POA: Diagnosis not present

## 2019-07-01 DIAGNOSIS — Z8249 Family history of ischemic heart disease and other diseases of the circulatory system: Secondary | ICD-10-CM | POA: Diagnosis not present

## 2019-07-01 DIAGNOSIS — E11319 Type 2 diabetes mellitus with unspecified diabetic retinopathy without macular edema: Secondary | ICD-10-CM | POA: Diagnosis present

## 2019-07-01 DIAGNOSIS — E1122 Type 2 diabetes mellitus with diabetic chronic kidney disease: Secondary | ICD-10-CM | POA: Diagnosis not present

## 2019-07-01 DIAGNOSIS — Z90722 Acquired absence of ovaries, bilateral: Secondary | ICD-10-CM | POA: Diagnosis not present

## 2019-07-01 DIAGNOSIS — E538 Deficiency of other specified B group vitamins: Secondary | ICD-10-CM | POA: Diagnosis present

## 2019-07-01 DIAGNOSIS — Z90710 Acquired absence of both cervix and uterus: Secondary | ICD-10-CM | POA: Diagnosis not present

## 2019-07-01 LAB — URINALYSIS, ROUTINE W REFLEX MICROSCOPIC
Bilirubin Urine: NEGATIVE
Glucose, UA: NEGATIVE mg/dL
Hgb urine dipstick: NEGATIVE
Ketones, ur: NEGATIVE mg/dL
Nitrite: NEGATIVE
Protein, ur: 30 mg/dL — AB
Specific Gravity, Urine: 1.023 (ref 1.005–1.030)
pH: 5 (ref 5.0–8.0)

## 2019-07-01 LAB — CBC
HCT: 32.3 % — ABNORMAL LOW (ref 36.0–46.0)
Hemoglobin: 9.9 g/dL — ABNORMAL LOW (ref 12.0–15.0)
MCH: 28.3 pg (ref 26.0–34.0)
MCHC: 30.7 g/dL (ref 30.0–36.0)
MCV: 92.3 fL (ref 80.0–100.0)
Platelets: 151 10*3/uL (ref 150–400)
RBC: 3.5 MIL/uL — ABNORMAL LOW (ref 3.87–5.11)
RDW: 13.7 % (ref 11.5–15.5)
WBC: 11.2 10*3/uL — ABNORMAL HIGH (ref 4.0–10.5)
nRBC: 0 % (ref 0.0–0.2)

## 2019-07-01 LAB — CREATININE, SERUM
Creatinine, Ser: 1.25 mg/dL — ABNORMAL HIGH (ref 0.44–1.00)
GFR calc Af Amer: 44 mL/min — ABNORMAL LOW (ref >60)
GFR calc non Af Amer: 38 mL/min — ABNORMAL LOW (ref >60)

## 2019-07-01 LAB — LACTIC ACID, PLASMA
Lactic Acid, Venous: 2 mmol/L (ref 0.5–1.9)
Lactic Acid, Venous: 1.6 mmol/L (ref 0.5–1.9)
Lactic Acid, Venous: 1.1 mmol/L (ref 0.5–1.9)

## 2019-07-01 LAB — CBG MONITORING, ED
Glucose-Capillary: 149 mg/dL — ABNORMAL HIGH (ref 70–99)
Glucose-Capillary: 138 mg/dL — ABNORMAL HIGH (ref 70–99)

## 2019-07-01 LAB — SARS CORONAVIRUS 2 (TAT 6-24 HRS): SARS Coronavirus 2: NEGATIVE

## 2019-07-01 LAB — HIV ANTIBODY (ROUTINE TESTING W REFLEX): HIV Screen 4th Generation wRfx: NONREACTIVE

## 2019-07-01 LAB — GLUCOSE, CAPILLARY
Glucose-Capillary: 76 mg/dL (ref 70–99)
Glucose-Capillary: 188 mg/dL — ABNORMAL HIGH (ref 70–99)

## 2019-07-01 MED ORDER — METOPROLOL SUCCINATE ER 25 MG PO TB24
12.5000 mg | ORAL_TABLET | Freq: Two times a day (BID) | ORAL | Status: DC
  Administered 2019-07-01 – 2019-07-08 (×15): 12.5 mg via ORAL
  Filled 2019-07-01 (×15): qty 1

## 2019-07-01 MED ORDER — VITAMIN B-12 1000 MCG PO TABS
1000.0000 ug | ORAL_TABLET | Freq: Every day | ORAL | Status: DC
  Administered 2019-07-01 – 2019-07-08 (×8): 1000 ug via ORAL
  Filled 2019-07-01 (×8): qty 1

## 2019-07-01 MED ORDER — ACETAMINOPHEN 325 MG PO TABS: 650.0000 mg | ORAL_TABLET | Freq: Four times a day (QID) | ORAL | Status: DC | PRN

## 2019-07-01 MED ORDER — HYDRALAZINE HCL 50 MG PO TABS
100.0000 mg | ORAL_TABLET | Freq: Three times a day (TID) | ORAL | Status: DC
  Administered 2019-07-01 – 2019-07-08 (×23): 100 mg via ORAL
  Filled 2019-07-01 (×25): qty 2

## 2019-07-01 MED ORDER — FERROUS SULFATE 325 (65 FE) MG PO TABS
325.0000 mg | ORAL_TABLET | Freq: Two times a day (BID) | ORAL | Status: DC
  Administered 2019-07-01 – 2019-07-08 (×16): 325 mg via ORAL
  Filled 2019-07-01 (×16): qty 1

## 2019-07-01 MED ORDER — SERTRALINE HCL 50 MG PO TABS
50.0000 mg | ORAL_TABLET | Freq: Every day | ORAL | Status: DC
  Administered 2019-07-01 – 2019-07-08 (×8): 50 mg via ORAL
  Filled 2019-07-01 (×9): qty 1

## 2019-07-01 MED ORDER — TRANDOLAPRIL 1 MG PO TABS
4.0000 mg | ORAL_TABLET | Freq: Every day | ORAL | Status: DC
  Administered 2019-07-01 – 2019-07-08 (×8): 4 mg via ORAL
  Filled 2019-07-01 (×2): qty 4
  Filled 2019-07-01: qty 1
  Filled 2019-07-01 (×4): qty 4
  Filled 2019-07-01: qty 1
  Filled 2019-07-01 (×4): qty 4

## 2019-07-01 MED ORDER — ONDANSETRON HCL 4 MG PO TABS: 4.0000 mg | ORAL_TABLET | Freq: Four times a day (QID) | ORAL | Status: DC | PRN

## 2019-07-01 MED ORDER — ALPRAZOLAM 0.5 MG PO TABS
0.5000 mg | ORAL_TABLET | Freq: Two times a day (BID) | ORAL | Status: DC | PRN
  Administered 2019-07-01 – 2019-07-07 (×7): 0.5 mg via ORAL
  Filled 2019-07-01 (×7): qty 1

## 2019-07-01 MED ORDER — MIRTAZAPINE 15 MG PO TABS
7.5000 mg | ORAL_TABLET | Freq: Every day | ORAL | Status: DC
  Administered 2019-07-01 – 2019-07-05 (×5): 7.5 mg via ORAL
  Administered 2019-07-06: 15 mg via ORAL
  Administered 2019-07-07: 7.5 mg via ORAL
  Filled 2019-07-01 (×8): qty 1

## 2019-07-01 MED ORDER — SODIUM CHLORIDE 0.9 % IV BOLUS (SEPSIS)
1000.0000 mL | Freq: Once | INTRAVENOUS | Status: AC
  Administered 2019-07-01: 1000 mL via INTRAVENOUS

## 2019-07-01 MED ORDER — HEPARIN SODIUM (PORCINE) 5000 UNIT/ML IJ SOLN
5000.0000 [IU] | Freq: Three times a day (TID) | INTRAMUSCULAR | Status: DC
  Administered 2019-07-01 – 2019-07-08 (×22): 5000 [IU] via SUBCUTANEOUS
  Filled 2019-07-01 (×22): qty 1

## 2019-07-01 MED ORDER — GUAIFENESIN 100 MG/5ML PO SOLN
5.0000 mL | ORAL | Status: DC | PRN
  Administered 2019-07-03 – 2019-07-07 (×3): 100 mg via ORAL
  Filled 2019-07-01 (×4): qty 5

## 2019-07-01 MED ORDER — SODIUM CHLORIDE 0.9 % IV SOLN
500.0000 mg | INTRAVENOUS | Status: DC
  Administered 2019-07-02: 500 mg via INTRAVENOUS
  Filled 2019-07-01 (×2): qty 500

## 2019-07-01 MED ORDER — INSULIN ASPART 100 UNIT/ML ~~LOC~~ SOLN
0.0000 [IU] | Freq: Three times a day (TID) | SUBCUTANEOUS | Status: DC
  Administered 2019-07-01 – 2019-07-02 (×3): 1 [IU] via SUBCUTANEOUS
  Administered 2019-07-02: 2 [IU] via SUBCUTANEOUS
  Administered 2019-07-03: 1 [IU] via SUBCUTANEOUS
  Administered 2019-07-03: 2 [IU] via SUBCUTANEOUS
  Administered 2019-07-03: 1 [IU] via SUBCUTANEOUS
  Administered 2019-07-04: 3 [IU] via SUBCUTANEOUS
  Administered 2019-07-04 (×2): 1 [IU] via SUBCUTANEOUS
  Administered 2019-07-05 (×2): 2 [IU] via SUBCUTANEOUS
  Administered 2019-07-06: 1 [IU] via SUBCUTANEOUS
  Administered 2019-07-06: 2 [IU] via SUBCUTANEOUS
  Administered 2019-07-07: 1 [IU] via SUBCUTANEOUS
  Administered 2019-07-07: 3 [IU] via SUBCUTANEOUS
  Administered 2019-07-07 – 2019-07-08 (×3): 1 [IU] via SUBCUTANEOUS
  Administered 2019-07-08: 2 [IU] via SUBCUTANEOUS

## 2019-07-01 MED ORDER — ACETAMINOPHEN 650 MG RE SUPP: 650.0000 mg | Freq: Four times a day (QID) | RECTAL | Status: DC | PRN

## 2019-07-01 MED ORDER — GEMFIBROZIL 600 MG PO TABS
600.0000 mg | ORAL_TABLET | Freq: Every day | ORAL | Status: DC
  Administered 2019-07-01 – 2019-07-07 (×7): 600 mg via ORAL
  Filled 2019-07-01 (×10): qty 1

## 2019-07-01 MED ORDER — ASPIRIN 325 MG PO TABS
325.0000 mg | ORAL_TABLET | Freq: Every day | ORAL | Status: DC
  Administered 2019-07-01 – 2019-07-08 (×8): 325 mg via ORAL
  Filled 2019-07-01 (×8): qty 1

## 2019-07-01 MED ORDER — ALBUTEROL SULFATE (2.5 MG/3ML) 0.083% IN NEBU
2.5000 mg | INHALATION_SOLUTION | RESPIRATORY_TRACT | Status: DC | PRN
  Administered 2019-07-03 – 2019-07-07 (×3): 2.5 mg via RESPIRATORY_TRACT
  Filled 2019-07-01 (×3): qty 3

## 2019-07-01 MED ORDER — ONDANSETRON HCL 4 MG/2ML IJ SOLN: 4.0000 mg | Freq: Four times a day (QID) | INTRAMUSCULAR | Status: DC | PRN

## 2019-07-01 MED ORDER — SODIUM CHLORIDE 0.9 % IV SOLN
1.0000 g | Freq: Once | INTRAVENOUS | Status: AC
  Administered 2019-07-01: 1 g via INTRAVENOUS
  Filled 2019-07-01: qty 10

## 2019-07-01 MED ORDER — AMLODIPINE BESYLATE 10 MG PO TABS
10.0000 mg | ORAL_TABLET | Freq: Every day | ORAL | Status: DC
  Administered 2019-07-01 – 2019-07-08 (×8): 10 mg via ORAL
  Filled 2019-07-01 (×2): qty 1
  Filled 2019-07-01: qty 2
  Filled 2019-07-01 (×5): qty 1

## 2019-07-01 MED ORDER — SODIUM CHLORIDE 0.9 % IV SOLN
500.0000 mg | Freq: Once | INTRAVENOUS | Status: AC
  Administered 2019-07-01: 500 mg via INTRAVENOUS
  Filled 2019-07-01: qty 500

## 2019-07-01 MED ORDER — SODIUM CHLORIDE 0.9 % IV SOLN
2.0000 g | INTRAVENOUS | Status: DC
  Administered 2019-07-02 – 2019-07-03 (×2): 2 g via INTRAVENOUS
  Filled 2019-07-01 (×2): qty 2

## 2019-07-01 MED ORDER — DOCUSATE SODIUM 100 MG PO CAPS
200.0000 mg | ORAL_CAPSULE | Freq: Every day | ORAL | Status: DC
  Administered 2019-07-01 – 2019-07-07 (×7): 200 mg via ORAL
  Filled 2019-07-01 (×7): qty 2

## 2019-07-01 NOTE — ED Notes (Signed)
RN contacted POA and updated her.  She understands she can not come back until the COVID test comes back.  She will continue to sleep in her car until she hears from [place].

## 2019-07-01 NOTE — ED Provider Notes (Signed)
Care assumed from Dr. Francia Greaves, patient with fever and vomiting with CT of abdomen and pelvis pending.  CT scan shows evidence of bibasilar pneumonia.  Urine also shows evidence of UTI.  Other labs are significant for renal insufficiency which is unchanged from baseline.  Coronavirus test is negative.  Case is discussed with Dr. Hal Hope of [facility], who agrees to admit the patient.  Results for orders placed or performed during the hospital encounter of 06/30/19  SARS CORONAVIRUS 2 (TAT 6-24 HRS) Nasopharyngeal Nasopharyngeal Swab   Specimen: Nasopharyngeal Swab  Result Value Ref Range   SARS Coronavirus 2 NEGATIVE NEGATIVE  Comprehensive metabolic panel  Result Value Ref Range   Sodium 138 135 - 145 mmol/L   Potassium 4.2 3.5 - 5.1 mmol/L   Chloride 104 98 - 111 mmol/L   CO2 22 22 - 32 mmol/L   Glucose, Bld 307 (H) 70 - 99 mg/dL   BUN 28 (H) 8 - 23 mg/dL   Creatinine, Ser 1.59 (H) 0.44 - 1.00 mg/dL   Calcium 9.0 8.9 - 10.3 mg/dL   Total Protein 6.0 (L) 6.5 - 8.1 g/dL   Albumin 3.7 3.5 - 5.0 g/dL   AST 21 15 - 41 U/L   ALT 13 0 - 44 U/L   Alkaline Phosphatase 90 38 - 126 U/L   Total Bilirubin 0.5 0.3 - 1.2 mg/dL   GFR calc non Af Amer 29 (L) >60 mL/min   GFR calc Af Amer 33 (L) >60 mL/min   Anion gap 12 5 - 15  CBC with Differential  Result Value Ref Range   WBC 17.6 (H) 4.0 - 10.5 K/uL   RBC 4.35 3.87 - 5.11 MIL/uL   Hemoglobin 12.3 12.0 - 15.0 g/dL   HCT 39.8 36.0 - 46.0 %   MCV 91.5 80.0 - 100.0 fL   MCH 28.3 26.0 - 34.0 pg   MCHC 30.9 30.0 - 36.0 g/dL   RDW 13.5 11.5 - 15.5 %   Platelets 197 150 - 400 K/uL   nRBC 0.0 0.0 - 0.2 %   Neutrophils Relative % 93 %   Neutro Abs 16.3 (H) 1.7 - 7.7 K/uL   Lymphocytes Relative 3 %   Lymphs Abs 0.5 (L) 0.7 - 4.0 K/uL   Monocytes Relative 4 %   Monocytes Absolute 0.7 0.1 - 1.0 K/uL   Eosinophils Relative 0 %   Eosinophils Absolute 0.0 0.0 - 0.5 K/uL   Basophils Relative 0 %   Basophils Absolute 0.1 0.0 - 0.1 K/uL   Immature Granulocytes 0 %   Abs Immature Granulocytes 0.07 0.00 - 0.07 K/uL  Protime-INR  Result Value Ref Range   Prothrombin Time 12.9 11.4 - 15.2 seconds   INR 1.0 0.8 - 1.2  Lipase, blood  Result Value Ref Range   Lipase 20 11 - 51 U/L  Urinalysis, Routine w reflex microscopic  Result Value Ref Range   Color, Urine YELLOW YELLOW   APPearance HAZY (A) CLEAR   Specific Gravity, Urine 1.023 1.005 - 1.030   pH 5.0 5.0 - 8.0   Glucose, UA NEGATIVE NEGATIVE mg/dL   Hgb urine dipstick NEGATIVE NEGATIVE   Bilirubin Urine NEGATIVE NEGATIVE   Ketones, ur NEGATIVE NEGATIVE mg/dL   Protein, ur 30 (A) NEGATIVE mg/dL   Nitrite NEGATIVE NEGATIVE   Leukocytes,Ua SMALL (A) NEGATIVE   RBC / HPF 0-5 0 - 5 RBC/hpf   WBC, UA 11-20 0 - 5 WBC/hpf   Bacteria, UA MANY (A) NONE SEEN  Squamous Epithelial / LPF 0-5 0 - 5   Mucus PRESENT   Lactic acid, plasma  Result Value Ref Range   Lactic Acid, Venous 2.0 (HH) 0.5 - 1.9 mmol/L   Ct Abdomen Pelvis Wo Contrast  Result Date: 07/01/2019 CLINICAL DATA:  Nausea and vomiting beginning at 5 p.m. yesterday. EXAM: CT ABDOMEN AND PELVIS WITHOUT CONTRAST TECHNIQUE: Multidetector CT imaging of the abdomen and pelvis was performed following the standard protocol without IV contrast. COMPARISON:  CT of the abdomen and pelvis 12/16/2017 FINDINGS: Lower chest: Patchy airspace opacities are present at the left base. Mild ground-glass attenuation is present at the medial right base as well. A small pericardial effusion is chronic. Coronary artery calcifications are present. The heart is mildly enlarged. Hepatobiliary: No focal liver abnormality is seen. No gallstones, gallbladder wall thickening, or biliary dilatation. Pancreas: Unremarkable. No pancreatic ductal dilatation or surrounding inflammatory changes. Spleen: Normal in size without focal abnormality. Adrenals/Urinary Tract: Adrenal glands are normal bilaterally. Water density lesions of the left kidney are  stable, consistent with cysts. No stone or solid mass lesion is evident. Ureters are within normal limits. The urinary bladder is unremarkable. Stomach/Bowel: A moderate-sized hiatal hernia is again noted. Distal esophagus is unremarkable. Stomach and duodenum are otherwise within normal limits. Small mesenteric lymph nodes are similar the prior study. No focal inflammatory changes are present in the small bowel. There is no obstruction. Terminal ileum is within normal limits. The ascending and transverse colon are normal. Descending colon is within normal limits. Diverticular changes are present in the sigmoid colon. Vascular/Lymphatic: Atherosclerotic calcifications are present in the aorta and branch vessels. There is no aneurysm or significant change. Reproductive: Status post hysterectomy. No adnexal masses. Other: No abdominal wall hernia or abnormality. No abdominopelvic ascites. Musculoskeletal: Remote superior endplate fracture of L4 is stable. Superior endplate Schmorl's node at L2 is stable. Vertebral body heights are otherwise maintained. No acute or healing fractures are present. No focal lesions are evident. The hips are located and within normal limits. IMPRESSION: 1. Left greater than right lower lobe airspace disease is concerning for pneumonia. 2. No acute or focal abdominal lesion to explain the patient's symptoms. 3. Stable moderate-sized hiatal hernia. 4. Sigmoid diverticulosis without diverticulitis. 5. Aortic Atherosclerosis (ICD10-I70.0). 6. Coronary artery disease. 7. Stable small pericardial effusion. Electronically Signed   By: San Morelle M.D.   On: 07/01/2019 04:22   Dg Chest Port 1 View  Result Date: 06/30/2019 CLINICAL DATA:  Cough and fever EXAM: PORTABLE CHEST 1 VIEW COMPARISON:  12/11/2016 FINDINGS: Mild cardiomegaly is again seen but stable. The lungs are well aerated bilaterally. No focal infiltrate or sizable effusion is seen. Mild central vascular congestion is  noted with early interstitial edema. IMPRESSION: Mild changes of congestive failure. Electronically Signed   By: [name] M.D.   On: 06/30/2019 21:50   Images viewed by me.    Delora Fuel, MD 76/16/07 [phone]

## 2019-07-01 NOTE — ED Notes (Signed)
RN called family member to inform her she could visit her mother was COVID negative.  Once visitor arrived she asked for "a nerve pill" for her mother however RN explained that her mother was resting comfortably.

## 2019-07-01 NOTE — H&P (Signed)
History and Physical    Renee [name] SEG:[id number] DOB: 1928/12/20 DOA: 06/30/2019  PCP: Abner Greenspan, MD  Patient coming from: Home.  Chief Complaint: Fever nausea vomiting.  HPI: Renee [name] is a 85 y.o. female with history of stroke with left-sided weakness uses walker to ambulate, diabetes mellitus type 2, hypertension, chronic kidney disease stage IV was brought to the ER after patient started developing some nausea vomiting and fever at home after eating supper. Symptoms started happening around 5 PM yesterday. Denies any chest pain shortness of breath productive cough or diarrhea. Denies any abdominal pain.  ED Course: In the ER patient was febrile with temperature of 101 F lab work showed leukocytosis of 17,000 lactate was 2 patient's lab work otherwise show baseline creatinine of 1.5 blood glucose 307 UA is concerning for UTI. CT abdomen pelvis done shows nothing acute abdomen but does show bilateral pneumonia and COVID-19 test was negative. Given the septic-like picture patient was given fluid bolus and empiric antibiotics for pneumonia and admitted for further management.  Review of Systems: As per HPI, rest all negative.   Past Medical History:  Diagnosis Date   Angioedema    possibly from voltaren   Bronchopneumonia 12/11/2016   Degenerative disc disease    Diabetes mellitus    type II   Hyperlipidemia    Hypertension    LVH (left ventricular hypertrophy)    and atrial enlargement by echo in past with nl EF   Nasal pruritis    Osteoarthritis    Osteopenia    Renal insufficiency    Sleep apnea    Stroke ([facility] [facility]) 05/2010   Small vessel sobcortical (in [place]) with Dr Leonie Man, residual L hemiparesis   Vitamin B 12 deficiency 04/08    Past Surgical History:  Procedure Laterality Date   ABDOMINAL HYSTERECTOMY     BSO-fibroids   APPENDECTOMY     BACK SURGERY     COLON SURGERY     due to punctured intestines   EYE SURGERY     cataract extraction   KNEE SURGERY     arthroscope   PARS PLANA VITRECTOMY  07/31/2011   Procedure: PARS PLANA VITRECTOMY WITH 25 GAUGE;  Surgeon: [name], MD;  Location: [facility];  Service: Ophthalmology;  Laterality: Right;  REMOVAL OF SILICONE OIL AND LASER RIGHT EYE   RETINAL DETACHMENT SURGERY  02/18/11   times 2   SPINE SURGERY  08/09   spinal decompression surgery     reports that she has never smoked. She has never used smokeless tobacco. She reports that she does not drink alcohol or use drugs.  Allergies  Allergen Reactions   Diclofenac Sodium Other (See Comments)    REACTION: angioedema   Pioglitazone Swelling    Family History  Problem Relation Age of Onset   COPD Brother    Cancer Sister        brain tumor with hemmorhage   Heart disease Sister        CAD    Prior to Admission medications   Medication Sig Start Date End Date Taking? Authorizing Provider  acetaminophen (TYLENOL) 500 MG tablet Take 1,000 mg by mouth at bedtime.     [provider]  albuterol (PROVENTIL HFA;VENTOLIN HFA) 108 (90 Base) MCG/ACT inhaler Inhale 2 puffs into the lungs every 4 (four) hours as needed for wheezing. 12/07/16   Tower, Wynelle Fanny, MD  ALPRAZolam [name]) 0.5 MG tablet TAKE 1 TABLET BY MOUTH TWICE  A DAY AS NEEDED FOR ANXIETY 04/21/19   Tower, Wynelle Fanny, MD  amLODipine (NORVASC) 10 MG tablet Take 1 tablet (10 mg total) by mouth daily. 09/30/18   Tower, Wynelle Fanny, MD  aspirin 325 MG tablet Take 325 mg by mouth daily.     [provider]  beta carotene w/minerals (OCUVITE) tablet Take 1 tablet by mouth 2 (two) times daily.     [provider]  Blood Glucose Monitoring Suppl (ONE TOUCH ULTRA 2) w/Device KIT Check blood sugar once daily and as directed. Dx E11.9 08/27/18   Tower, Wynelle Fanny, MD  calcium-vitamin D (OSCAL WITH D) 500-200 MG-UNIT per tablet Take 1 tablet by mouth daily.     [provider]  Cholecalciferol (VITAMIN D) 1000 UNITS capsule  Take 1,000 Units by mouth daily.     [provider]  diphenoxylate-atropine (LOMOTIL) 2.5-0.025 MG tablet Take 1 tablet by mouth 2 (two) times daily as needed for diarrhea or loose stools. 01/15/18   Tower, Wynelle Fanny, MD  docusate sodium (COLACE) 100 MG capsule Take 200 mg by mouth at bedtime.    [provider]  ferrous sulfate 325 (65 FE) MG tablet Take 325 mg by mouth 2 (two) times daily.    [provider]  gemfibrozil (LOPID) 600 MG tablet Take 1 tablet (600 mg total) by mouth daily. 09/30/18   Tower, Wynelle Fanny, MD  glipiZIDE (GLUCOTROL XL) 2.5 MG 24 hr tablet Take 1 tablet (2.5 mg total) by mouth daily with breakfast. 09/30/18   Tower, Wynelle Fanny, MD  guaiFENesin (ROBITUSSIN) 100 MG/5ML SOLN Take 5 mLs by mouth every 4 (four) hours as needed for cough or to loosen phlegm.    [provider]  hydrALAZINE (APRESOLINE) 100 MG tablet Take 100 mg by mouth 3 (three) times daily.  08/30/14   [provider]  metoprolol succinate (TOPROL-XL) 25 MG 24 hr tablet Take 0.5 tablets (12.5 mg total) by mouth 2 (two) times daily. 09/30/18   Tower, Wynelle Fanny, MD  metroNIDAZOLE (FLAGYL) 500 MG tablet Take 1 tablet (500 mg total) by mouth 3 (three) times daily. 05/11/19   Tower, Wynelle Fanny, MD  mirtazapine (REMERON) 15 MG tablet TAKE 1/2 TABLET BY MOUTH AT BEDTIME 06/16/19   Tower, Wynelle Fanny, MD  Multiple Vitamin (MULTIVITAMIN) capsule Take 1 capsule by mouth daily.     [provider]  OneTouch Delica Lancets 09T MISC 1 Stick by Other route daily. Check blood sugar once daily and as directed. Dx E11.9 03/13/19   Tower, Wynelle Fanny, MD  ONETOUCH ULTRA test strip CHECK BLOOD SUGAR ONCE DAILY AND AS DIRECTED. DX E11.9 03/10/19   Tower, Wynelle Fanny, MD  Polyvinyl Alcohol-Povidone (REFRESH OP) Place 1 drop into both eyes daily as needed (dry eyes).    [provider]  sertraline (ZOLOFT) 50 MG tablet Take 1 tablet (50 mg total) by mouth daily. 09/30/18   Tower, Wynelle Fanny, MD    trandolapril (MAVIK) 4 MG tablet Take 1 tablet (4 mg total) by mouth daily. 09/30/18   Tower, Wynelle Fanny, MD  vitamin B-12 (CYANOCOBALAMIN) 100 MCG tablet Take 100 mcg by mouth daily.    [provider]    Physical Exam: Constitutional: Moderately built and nourished. Vitals:   07/01/19 0230 07/01/19 0300 07/01/19 0330 07/01/19 0421  BP: 139/60 (!) 146/85 135/61   Pulse: 71 67 67   Resp: (!) 25 (!) 23 (!) 21   Temp:    98.8 F (37.1 C)  TempSrc:    Oral  SpO2: 94% 95% 94%    Eyes: Anicteric no pallor. ENMT: No discharge from the ears eyes nose or mouth. Neck: No mass felt. No neck rigidity. Respiratory: No rhonchi or crepitations. Cardiovascular: S1-S2 heard. Abdomen: Soft nontender bowel sounds present. Musculoskeletal: No edema. No joint effusion. Skin: No rash. Neurologic: Alert awake oriented to time place and person. Has mild weakness of the left upper lower extremity from previous stroke. Psychiatric: Appears normal per normal affect.   Labs on Admission: I have personally reviewed following labs and imaging studies  CBC: Recent Labs  Lab 06/30/19 2118  WBC 17.6*  NEUTROABS 16.3*  HGB 12.3  HCT 39.8  MCV 91.5  PLT 283   Basic Metabolic Panel: Recent Labs  Lab 06/30/19 2118  NA 138  K 4.2  CL 104  CO2 22  GLUCOSE 307*  BUN 28*  CREATININE 1.59*  CALCIUM 9.0   GFR: CrCl cannot be calculated (Unknown ideal weight.). Liver Function Tests: Recent Labs  Lab 06/30/19 2118  AST 21  ALT 13  ALKPHOS 90  BILITOT 0.5  PROT 6.0*  ALBUMIN 3.7   Recent Labs  Lab 06/30/19 2118  LIPASE 20   No results for input(s): AMMONIA in the last 168 hours. Coagulation Profile: Recent Labs  Lab 06/30/19 2118  INR 1.0   Cardiac Enzymes: No results for input(s): CKTOTAL, CKMB, CKMBINDEX, TROPONINI in the last 168 hours. BNP (last 3 results) No results for input(s): PROBNP in the last 8760 hours. HbA1C: No results for input(s): HGBA1C in the last 72  hours. CBG: No results for input(s): GLUCAP in the last 168 hours. Lipid Profile: No results for input(s): CHOL, HDL, LDLCALC, TRIG, CHOLHDL, LDLDIRECT in the last 72 hours. Thyroid Function Tests: No results for input(s): TSH, T4TOTAL, FREET4, T3FREE, THYROIDAB in the last 72 hours. Anemia Panel: No results for input(s): VITAMINB12, FOLATE, FERRITIN, TIBC, IRON, RETICCTPCT in the last 72 hours. Urine analysis:    Component Value Date/Time   COLORURINE YELLOW 07/01/2019 0343   APPEARANCEUR HAZY (A) 07/01/2019 0343   LABSPEC 1.023 07/01/2019 0343   PHURINE 5.0 07/01/2019 0343   GLUCOSEU NEGATIVE 07/01/2019 0343   HGBUR NEGATIVE 07/01/2019 0343   HGBUR negative 03/27/2010 1358   BILIRUBINUR NEGATIVE 07/01/2019 0343   BILIRUBINUR neg. 06/18/2014 [place] 07/01/2019 0343   PROTEINUR 30 (A) 07/01/2019 0343   UROBILINOGEN 0.2 06/18/2014 1155   UROBILINOGEN 0.2 09/04/2012 1135   NITRITE NEGATIVE 07/01/2019 0343   LEUKOCYTESUR SMALL (A) 07/01/2019 0343   Sepsis Labs: '@LABRCNTIP'$ (procalcitonin:4,lacticidven:4) ) Recent Results (from the past 240 hour(s))  SARS CORONAVIRUS 2 (TAT 6-24 HRS) Nasopharyngeal Nasopharyngeal Swab     Status: None   Collection Time: 06/30/19  9:12 PM   Specimen: Nasopharyngeal Swab  Result Value Ref Range Status   SARS Coronavirus 2 NEGATIVE NEGATIVE Final    Comment: (NOTE) SARS-CoV-2 target nucleic acids are NOT DETECTED. The SARS-CoV-2 RNA is generally detectable in upper and lower respiratory specimens during the acute phase of infection. Negative results do not preclude SARS-CoV-2 infection, do not rule out co-infections with other pathogens, and should not be used as the sole basis for treatment or other patient management decisions. Negative results must be combined with clinical observations, patient history, and epidemiological information. The expected result is Negative. Fact Sheet for  Patients: SugarRoll.be Fact Sheet for Healthcare Providers: https://www.woods-mathews.com/ This test is not yet approved or cleared by the [place] FDA and  has been  authorized for detection and/or diagnosis of SARS-CoV-2 by FDA under an Emergency Use Authorization (EUA). This EUA will remain  in effect (meaning this test can be used) for the duration of the COVID-19 declaration under Section 56 4(b)(1) of the Act, 21 U.S.C. section 360bbb-3(b)(1), unless the authorization is terminated or revoked sooner. Performed at [facility], [place] [address] [place], [place] [address]      Radiological Exams on Admission: Ct Abdomen Pelvis Wo Contrast  Result Date: 07/01/2019 CLINICAL DATA:  Nausea and vomiting beginning at 5 p.m. yesterday. EXAM: CT ABDOMEN AND PELVIS WITHOUT CONTRAST TECHNIQUE: Multidetector CT imaging of the abdomen and pelvis was performed following the standard protocol without IV contrast. COMPARISON:  CT of the abdomen and pelvis 12/16/2017 FINDINGS: Lower chest: Patchy airspace opacities are present at the left base. Mild ground-glass attenuation is present at the medial right base as well. A small pericardial effusion is chronic. Coronary artery calcifications are present. The heart is mildly enlarged. Hepatobiliary: No focal liver abnormality is seen. No gallstones, gallbladder wall thickening, or biliary dilatation. Pancreas: Unremarkable. No pancreatic ductal dilatation or surrounding inflammatory changes. Spleen: Normal in size without focal abnormality. Adrenals/Urinary Tract: Adrenal glands are normal bilaterally. Water density lesions of the left kidney are stable, consistent with cysts. No stone or solid mass lesion is evident. Ureters are within normal limits. The urinary bladder is unremarkable. Stomach/Bowel: A moderate-sized hiatal hernia is again noted. Distal esophagus is unremarkable. Stomach and duodenum are  otherwise within normal limits. Small mesenteric lymph nodes are similar the prior study. No focal inflammatory changes are present in the small bowel. There is no obstruction. Terminal ileum is within normal limits. The ascending and transverse colon are normal. Descending colon is within normal limits. Diverticular changes are present in the sigmoid colon. Vascular/Lymphatic: Atherosclerotic calcifications are present in the aorta and branch vessels. There is no aneurysm or significant change. Reproductive: Status post hysterectomy. No adnexal masses. Other: No abdominal wall hernia or abnormality. No abdominopelvic ascites. Musculoskeletal: Remote superior endplate fracture of L4 is stable. Superior endplate Schmorl's node at L2 is stable. Vertebral body heights are otherwise maintained. No acute or healing fractures are present. No focal lesions are evident. The hips are located and within normal limits. IMPRESSION: 1. Left greater than right lower lobe airspace disease is concerning for pneumonia. 2. No acute or focal abdominal lesion to explain the patient's symptoms. 3. Stable moderate-sized hiatal hernia. 4. Sigmoid diverticulosis without diverticulitis. 5. Aortic Atherosclerosis (ICD10-I70.0). 6. Coronary artery disease. 7. Stable small pericardial effusion. Electronically Signed   By: San Morelle M.D.   On: 07/01/2019 04:22   Dg Chest Port 1 View  Result Date: 06/30/2019 CLINICAL DATA:  Cough and fever EXAM: PORTABLE CHEST 1 VIEW COMPARISON:  12/11/2016 FINDINGS: Mild cardiomegaly is again seen but stable. The lungs are well aerated bilaterally. No focal infiltrate or sizable effusion is seen. Mild central vascular congestion is noted with early interstitial edema. IMPRESSION: Mild changes of congestive failure. Electronically Signed   By: [name] M.D.   On: 06/30/2019 21:50     Assessment/Plan Principal Problem:   Sepsis ([place]) Active Problems:   Type 2 diabetes, controlled, with  retinopathy ([place])   Essential hypertension   Sleep apnea   Community acquired pneumonia   History of CVA (cerebrovascular accident)   CKD (chronic kidney disease) stage 4, GFR 15-29 ml/min ([place])    1. Sepsis secondary to community-acquired pneumonia and possible UTI for which patient is on  empiric antibiotics follow cultures. Patient did receive IV fluids per sepsis protocol. Given the chest x-ray shows possibility of congestion will hold off further fluids until lactate is worsening. 2. Nausea vomiting CT abdomen unremarkable abdomen appears benign. LFTs are normal. Likely from pneumonia. 3. Diabetes mellitus type 2 we will keep patient on sliding scale coverage. 4. Hypertension patient is on Toprol hydralazine amlodipine and Mavik. 5. History of stroke on Lopid and aspirin. 6. Chronic kidney disease stage IV creatinine appears to be at baseline. Note that patient is on ACE inhibitor. 7. Sleep apnea per chart.  Given the septic picture like presentation patient will need more than 2 midnight stay in inpatient status.   DVT prophylaxis: Heparin. Code Status: Full code. Family Communication: Patient's sister-in-law. At the bedside. Disposition Plan: Home. Consults called: None. Admission status: Inpatient.   Rise Patience MD [facility] Pager [phone].  If 7PM-7AM, please contact night-coverage [email or web address] Password [facility]  07/01/2019, 5:28 AM

## 2019-07-01 NOTE — ED Notes (Signed)
Lunch Tray Ordered @ 1127.  

## 2019-07-01 NOTE — ED Notes (Signed)
Informed provider of lactic

## 2019-07-01 NOTE — Progress Notes (Signed)
Per HPI: Renee [name] is a 85 y.o. female with history of stroke with left-sided weakness uses walker to ambulate, diabetes mellitus type 2, hypertension, chronic kidney disease stage IV was brought to the ER after patient started developing some nausea vomiting and fever at home after eating supper. Symptoms started happening around 5 PM yesterday. Denies any chest pain shortness of breath productive cough or diarrhea. Denies any abdominal pain.  ED Course: In the ER patient was febrile with temperature of 101 F lab work showed leukocytosis of 17,000 lactate was 2 patient's lab work otherwise show baseline creatinine of 1.5 blood glucose 307 UA is concerning for UTI. CT abdomen pelvis done shows nothing acute abdomen but does show bilateral pneumonia and COVID-19 test was negative. Given the septic-like picture patient was given fluid bolus and empiric antibiotics for pneumonia and admitted for further management.  Patient seen and evaluated this am with no acute concerns noted. She is being helped to the commode. I have spoken with her sister-in-law at bedside. She has been admitted with concerns for sepsis secondary to CAP and UTI and started on Azithomycin and Rocephin with cultures pending. Will follow up am labs.  Total care time: 20 minutes.

## 2019-07-01 NOTE — ED Notes (Signed)
ED TO INPATIENT HANDOFF REPORT  ED Nurse Name and Phone #: Zelphia Cairo [phone]  S Name/Age/Gender Renee [name] 85 y.o. female Room/Bed: 024C/024C  Code Status   Code Status: Full Code  Home/SNF/Other Home Patient oriented to: self, place, time and situation Is this baseline? Yes   Triage Complete: Triage complete  Chief Complaint N/V; Fever; Weakness, Cough  Triage Note Pt presents to ED via EMS for fever, N/V that began today at 5p. Pt caregiver called EMS, stays with pt 6hrs out of day. Lives at home alone.  EMS exam: increased work of breathing with possible diminished lung sounds in lower lobes, febrile (100F), 210/100, P90bpm, RR16, CBG299, spO2 92RA, pain 0  H/o htn, dm, anxiety  Pt arrived via [facility]; pt from hm with c/o N/V; enroute pt c/o cough and nasal congestion; pt attempted to eat oysters at 6p and vomited food back up; pt had a temp of 100.0; 91% on RA; pt placed on 3L181/88;cbg 299; 30, 100, 98.0   Allergies Allergies  Allergen Reactions  . Diclofenac Sodium Other (See Comments)    REACTION: angioedema  . Pioglitazone Swelling    Level of Care/Admitting Diagnosis ED Disposition    ED Disposition Condition [facility] Area: [place] [[id number]]  Level of Care: Telemetry Medical [104]  Covid Evaluation: Confirmed COVID Negative  Diagnosis: Sepsis [facility]) [[id number]]  Admitting Physician: Rise Patience [phone]  Attending Physician: Rise Patience [phone]  Estimated length of stay: past midnight tomorrow  Certification:: I certify this patient will need inpatient services for at least 2 midnights  PT Class (Do Not Modify): Inpatient [101]  PT Acc Code (Do Not Modify): Private [1]       B Medical/Surgery History Past Medical History:  Diagnosis Date  . Angioedema    possibly from voltaren  . Bronchopneumonia 12/11/2016  . Degenerative disc disease   . Diabetes mellitus    type II  . Hyperlipidemia   .  Hypertension   . LVH (left ventricular hypertrophy)    and atrial enlargement by echo in past with nl EF  . Nasal pruritis   . Osteoarthritis   . Osteopenia   . Renal insufficiency   . Sleep apnea   . Stroke [facility]) 05/2010   Small vessel sobcortical (in [place] trial) with Dr Leonie Man, residual L hemiparesis  . Vitamin B 12 deficiency 04/08   Past Surgical History:  Procedure Laterality Date  . ABDOMINAL HYSTERECTOMY     BSO-fibroids  . APPENDECTOMY    . BACK SURGERY    . COLON SURGERY     due to punctured intestines  . EYE SURGERY     cataract extraction  . KNEE SURGERY     arthroscope  . PARS PLANA VITRECTOMY  07/31/2011   Procedure: PARS PLANA VITRECTOMY WITH 25 GAUGE;  Surgeon: [name], MD;  Location: [place];  Service: Ophthalmology;  Laterality: Right;  REMOVAL OF SILICONE OIL AND LASER RIGHT EYE  . RETINAL DETACHMENT SURGERY  02/18/11   times 2  . SPINE SURGERY  08/09   spinal decompression surgery     A IV Location/Drains/Wounds Patient Lines/Drains/Airways Status   Active Line/Drains/Airways    Name:   Placement date:   Placement time:   Site:   Days:   Peripheral IV 06/30/19 Right Antecubital   06/30/19    2124    Antecubital   1   Incision 07/31/11 Eye Right   07/31/11  1421     2892          Intake/Output Last 24 hours  Intake/Output Summary (Last 24 hours) at 07/01/2019 1722 Last data filed at 07/01/2019 0554 Gross per 24 hour  Intake 100 ml  Output -  Net 100 ml    Labs/Imaging Results for orders placed or performed during the hospital encounter of 06/30/19 (from the past 48 hour(s))  SARS CORONAVIRUS 2 (TAT 6-24 HRS) Nasopharyngeal Nasopharyngeal Swab     Status: None   Collection Time: 06/30/19  9:12 PM   Specimen: Nasopharyngeal Swab  Result Value Ref Range   SARS Coronavirus 2 NEGATIVE NEGATIVE    Comment: (NOTE) SARS-CoV-2 target nucleic acids are NOT DETECTED. The SARS-CoV-2 RNA is generally detectable in upper and lower respiratory  specimens during the acute phase of infection. Negative results do not preclude SARS-CoV-2 infection, do not rule out co-infections with other pathogens, and should not be used as the sole basis for treatment or other patient management decisions. Negative results must be combined with clinical observations, patient history, and epidemiological information. The expected result is Negative. Fact Sheet for Patients: SugarRoll.be Fact Sheet for Healthcare Providers: https://www.woods-mathews.com/ This test is not yet approved or cleared by the [place] FDA and  has been authorized for detection and/or diagnosis of SARS-CoV-2 by FDA under an Emergency Use Authorization (EUA). This EUA will remain  in effect (meaning this test can be used) for the duration of the COVID-19 declaration under Section 56 4(b)(1) of the Act, 21 U.S.C. section 360bbb-3(b)(1), unless the authorization is terminated or revoked sooner. Performed at [facility], [place] [address] [place], [place] [address]   Comprehensive metabolic panel     Status: Abnormal   Collection Time: 06/30/19  9:18 PM  Result Value Ref Range   Sodium 138 135 - 145 mmol/L   Potassium 4.2 3.5 - 5.1 mmol/L   Chloride 104 98 - 111 mmol/L   CO2 22 22 - 32 mmol/L   Glucose, Bld 307 (H) 70 - 99 mg/dL   BUN 28 (H) 8 - 23 mg/dL   Creatinine, Ser 1.59 (H) 0.44 - 1.00 mg/dL   Calcium 9.0 8.9 - 10.3 mg/dL   Total Protein 6.0 (L) 6.5 - 8.1 g/dL   Albumin 3.7 3.5 - 5.0 g/dL   AST 21 15 - 41 U/L   ALT 13 0 - 44 U/L   Alkaline Phosphatase 90 38 - 126 U/L   Total Bilirubin 0.5 0.3 - 1.2 mg/dL   GFR calc non Af Amer 29 (L) >60 mL/min   GFR calc Af Amer 33 (L) >60 mL/min   Anion gap 12 5 - 15    Comment: Performed at [facility] [address] [place], [place] [address]  CBC with Differential     Status: Abnormal   Collection Time: 06/30/19  9:18 PM  Result Value Ref Range   WBC 17.6  (H) 4.0 - 10.5 K/uL   RBC 4.35 3.87 - 5.11 MIL/uL   Hemoglobin 12.3 12.0 - 15.0 g/dL   HCT 39.8 36.0 - 46.0 %   MCV 91.5 80.0 - 100.0 fL   MCH 28.3 26.0 - 34.0 pg   MCHC 30.9 30.0 - 36.0 g/dL   RDW 13.5 11.5 - 15.5 %   Platelets 197 150 - 400 K/uL   nRBC 0.0 0.0 - 0.2 %   Neutrophils Relative % 93 %   Neutro Abs 16.3 (H) 1.7 - 7.7 K/uL  Lymphocytes Relative 3 %   Lymphs Abs 0.5 (L) 0.7 - 4.0 K/uL   Monocytes Relative 4 %   Monocytes Absolute 0.7 0.1 - 1.0 K/uL   Eosinophils Relative 0 %   Eosinophils Absolute 0.0 0.0 - 0.5 K/uL   Basophils Relative 0 %   Basophils Absolute 0.1 0.0 - 0.1 K/uL   Immature Granulocytes 0 %   Abs Immature Granulocytes 0.07 0.00 - 0.07 K/uL    Comment: Performed at [facility] [address], [place], [place] [address]  Protime-INR     Status: None   Collection Time: 06/30/19  9:18 PM  Result Value Ref Range   Prothrombin Time 12.9 11.4 - 15.2 seconds   INR 1.0 0.8 - 1.2    Comment: (NOTE) INR goal varies based on device and disease states. Performed at [facility], [place] [address] [place], [place] [address]   Lipase, blood     Status: None   Collection Time: 06/30/19  9:18 PM  Result Value Ref Range   Lipase 20 11 - 51 U/L    Comment: Performed at [facility], [place] [address] [place], [place] [address]  Lactic acid, plasma     Status: Abnormal   Collection Time: 07/01/19 12:54 AM  Result Value Ref Range   Lactic Acid, Venous 2.0 (HH) 0.5 - 1.9 mmol/L    Comment: CRITICAL RESULT CALLED TO, READ BACK BY AND VERIFIED WITH: Lubertha Sayres 07/01/19 0129 WAYK Performed at [facility] [address] [place], [place] [address]   Urinalysis, Routine w reflex microscopic     Status: Abnormal   Collection Time: 07/01/19  3:43 AM  Result Value Ref Range   Color, Urine YELLOW YELLOW   APPearance HAZY (A) CLEAR   Specific Gravity, Urine 1.023 1.005 - 1.030   pH 5.0 5.0 - 8.0   Glucose, UA NEGATIVE NEGATIVE mg/dL   Hgb  urine dipstick NEGATIVE NEGATIVE   Bilirubin Urine NEGATIVE NEGATIVE   Ketones, ur NEGATIVE NEGATIVE mg/dL   Protein, ur 30 (A) NEGATIVE mg/dL   Nitrite NEGATIVE NEGATIVE   Leukocytes,Ua SMALL (A) NEGATIVE   RBC / HPF 0-5 0 - 5 RBC/hpf   WBC, UA 11-20 0 - 5 WBC/hpf   Bacteria, UA MANY (A) NONE SEEN   Squamous Epithelial / LPF 0-5 0 - 5   Mucus PRESENT     Comment: Performed at [facility] [address], [place], [place] [address]  Lactic acid, plasma     Status: None   Collection Time: 07/01/19  4:35 AM  Result Value Ref Range   Lactic Acid, Venous 1.6 0.5 - 1.9 mmol/L    Comment: Performed at [facility] [address], [place], [place] [address]  CBG monitoring, ED     Status: Abnormal   Collection Time: 07/01/19  8:18 AM  Result Value Ref Range   Glucose-Capillary 149 (H) 70 - 99 mg/dL  HIV Antibody (routine testing w rflx)     Status: None   Collection Time: 07/01/19  8:21 AM  Result Value Ref Range   HIV Screen 4th Generation wRfx NON REACTIVE NON REACTIVE    Comment: Performed at [facility], [place] [address], [place] [address]  CBC     Status: Abnormal   Collection Time: 07/01/19  8:21 AM  Result Value Ref Range   WBC 11.2 (H) 4.0 - 10.5 K/uL   RBC 3.50 (L)  3.87 - 5.11 MIL/uL   Hemoglobin 9.9 (L) 12.0 - 15.0 g/dL   HCT 32.3 (L) 36.0 - 46.0 %   MCV 92.3 80.0 - 100.0 fL   MCH 28.3 26.0 - 34.0 pg   MCHC 30.7 30.0 - 36.0 g/dL   RDW 13.7 11.5 - 15.5 %   Platelets 151 150 - 400 K/uL   nRBC 0.0 0.0 - 0.2 %    Comment: Performed at [facility] [address] [place], [place] [address]  Creatinine, serum     Status: Abnormal   Collection Time: 07/01/19  8:21 AM  Result Value Ref Range   Creatinine, Ser 1.25 (H) 0.44 - 1.00 mg/dL   GFR calc non Af Amer 38 (L) >60 mL/min   GFR calc Af Amer 44 (L) >60 mL/min    Comment: Performed at [facility] [address] [place], [place] [address]  Lactic acid, plasma     Status: None    Collection Time: 07/01/19  8:21 AM  Result Value Ref Range   Lactic Acid, Venous 1.1 0.5 - 1.9 mmol/L    Comment: Performed at [facility] [address] [place], [place] [address]  CBG monitoring, ED     Status: Abnormal   Collection Time: 07/01/19  2:22 PM  Result Value Ref Range   Glucose-Capillary 138 (H) 70 - 99 mg/dL   Ct Abdomen Pelvis Wo Contrast  Result Date: 07/01/2019 CLINICAL DATA:  Nausea and vomiting beginning at 5 p.m. yesterday. EXAM: CT ABDOMEN AND PELVIS WITHOUT CONTRAST TECHNIQUE: Multidetector CT imaging of the abdomen and pelvis was performed following the standard protocol without IV contrast. COMPARISON:  CT of the abdomen and pelvis 12/16/2017 FINDINGS: Lower chest: Patchy airspace opacities are present at the left base. Mild ground-glass attenuation is present at the medial right base as well. A small pericardial effusion is chronic. Coronary artery calcifications are present. The heart is mildly enlarged. Hepatobiliary: No focal liver abnormality is seen. No gallstones, gallbladder wall thickening, or biliary dilatation. Pancreas: Unremarkable. No pancreatic ductal dilatation or surrounding inflammatory changes. Spleen: Normal in size without focal abnormality. Adrenals/Urinary Tract: Adrenal glands are normal bilaterally. Water density lesions of the left kidney are stable, consistent with cysts. No stone or solid mass lesion is evident. Ureters are within normal limits. The urinary bladder is unremarkable. Stomach/Bowel: A moderate-sized hiatal hernia is again noted. Distal esophagus is unremarkable. Stomach and duodenum are otherwise within normal limits. Small mesenteric lymph nodes are similar the prior study. No focal inflammatory changes are present in the small bowel. There is no obstruction. Terminal ileum is within normal limits. The ascending and transverse colon are normal. Descending colon is within normal limits. Diverticular changes are present in the  sigmoid colon. Vascular/Lymphatic: Atherosclerotic calcifications are present in the aorta and branch vessels. There is no aneurysm or significant change. Reproductive: Status post hysterectomy. No adnexal masses. Other: No abdominal wall hernia or abnormality. No abdominopelvic ascites. Musculoskeletal: Remote superior endplate fracture of L4 is stable. Superior endplate Schmorl's node at L2 is stable. Vertebral body heights are otherwise maintained. No acute or healing fractures are present. No focal lesions are evident. The hips are located and within normal limits. IMPRESSION: 1. Left greater than right lower lobe airspace disease is concerning for pneumonia. 2. No acute or focal abdominal lesion to explain the patient's symptoms. 3. Stable moderate-sized hiatal hernia. 4. Sigmoid diverticulosis without diverticulitis. 5. Aortic Atherosclerosis (ICD10-I70.0). 6. Coronary artery disease. 7. Stable  small pericardial effusion. Electronically Signed   By: San Morelle M.D.   On: 07/01/2019 04:22   Dg Chest Port 1 View  Result Date: 06/30/2019 CLINICAL DATA:  Cough and fever EXAM: PORTABLE CHEST 1 VIEW COMPARISON:  12/11/2016 FINDINGS: Mild cardiomegaly is again seen but stable. The lungs are well aerated bilaterally. No focal infiltrate or sizable effusion is seen. Mild central vascular congestion is noted with early interstitial edema. IMPRESSION: Mild changes of congestive failure. Electronically Signed   By: Renee Catalina M.D.   On: 06/30/2019 21:50    Pending Labs Unresulted Labs (From admission, onward)    Start     Ordered   07/02/19 5462  Basic metabolic panel  Tomorrow morning,   R     07/01/19 0526   07/02/19 0500  CBC  Tomorrow morning,   R     07/01/19 0526   07/01/19 0527  Lactic acid, plasma  STAT Now then every 3 hours,   R     07/01/19 0526   07/01/19 0348  Urine culture  ONCE - STAT,   STAT     07/01/19 0347   06/30/19 2117  Culture, blood (routine x 2)  BLOOD CULTURE X 2,    STAT     06/30/19 2116          Vitals/Pain Today's Vitals   07/01/19 1530 07/01/19 1600 07/01/19 1630 07/01/19 1700  BP: (!) 136/53 (!) 143/57 (!) 148/59 (!) 132/53  Pulse: 63 67 66 64  Resp: (!) 21 15 17 20   Temp:      TempSrc:      SpO2: 98% 97% 97% 97%  PainSc:        Isolation Precautions No active isolations  Medications Medications  aspirin tablet 325 mg (325 mg Oral Given 07/01/19 1405)  amLODipine (NORVASC) tablet 10 mg (10 mg Oral Given 07/01/19 1405)  gemfibrozil (LOPID) tablet 600 mg (600 mg Oral Given 07/01/19 0823)  hydrALAZINE (APRESOLINE) tablet 100 mg (100 mg Oral Given 07/01/19 1511)  metoprolol succinate (TOPROL-XL) 24 hr tablet 12.5 mg (12.5 mg Oral Given 07/01/19 1406)  trandolapril (MAVIK) tablet 4 mg (4 mg Oral Given 07/01/19 1511)  ALPRAZolam (XANAX) tablet 0.5 mg (has no administration in time range)  mirtazapine (REMERON) tablet 7.5 mg (has no administration in time range)  sertraline (ZOLOFT) tablet 50 mg (50 mg Oral Given 07/01/19 1511)  docusate sodium (COLACE) capsule 200 mg (has no administration in time range)  ferrous sulfate tablet 325 mg (325 mg Oral Given 07/01/19 0823)  vitamin B-12 (CYANOCOBALAMIN) tablet 1,000 mcg (1,000 mcg Oral Given 07/01/19 1408)  albuterol (PROVENTIL) (2.5 MG/3ML) 0.083% nebulizer solution 2.5 mg (has no administration in time range)  guaiFENesin (ROBITUSSIN) 100 MG/5ML solution 100 mg (has no administration in time range)  acetaminophen (TYLENOL) tablet 650 mg (has no administration in time range)    Or  acetaminophen (TYLENOL) suppository 650 mg (has no administration in time range)  ondansetron (ZOFRAN) tablet 4 mg (has no administration in time range)    Or  ondansetron (ZOFRAN) injection 4 mg (has no administration in time range)  cefTRIAXone (ROCEPHIN) 2 g in sodium chloride 0.9 % 100 mL IVPB (has no administration in time range)  azithromycin (ZITHROMAX) 500 mg in sodium chloride 0.9 % 250 mL IVPB (has no  administration in time range)  insulin aspart (novoLOG) injection 0-9 Units (1 Units Subcutaneous Given 07/01/19 1431)  heparin injection 5,000 Units (5,000 Units Subcutaneous Given 07/01/19 1436)  acetaminophen (TYLENOL)  tablet 1,000 mg (1,000 mg Oral Given 06/30/19 2255)  sodium chloride 0.9 % bolus 1,000 mL (0 mLs Intravenous Stopped 07/01/19 0338)    And  sodium chloride 0.9 % bolus 1,000 mL (0 mLs Intravenous Stopped 07/01/19 0622)    And  sodium chloride 0.9 % bolus 1,000 mL (0 mLs Intravenous Stopped 07/01/19 0521)  cefTRIAXone (ROCEPHIN) 1 g in sodium chloride 0.9 % 100 mL IVPB (0 g Intravenous Stopped 07/01/19 0554)  azithromycin (ZITHROMAX) 500 mg in sodium chloride 0.9 % 250 mL IVPB (0 mg Intravenous Stopped 07/01/19 0657)    Mobility non-ambulatory Low fall risk   Focused Assessments    R Recommendations: See Admitting Provider Note  Report given to:   Additional Notes:

## 2019-07-02 LAB — GLUCOSE, CAPILLARY
Glucose-Capillary: 123 mg/dL — ABNORMAL HIGH (ref 70–99)
Glucose-Capillary: 165 mg/dL — ABNORMAL HIGH (ref 70–99)
Glucose-Capillary: 184 mg/dL — ABNORMAL HIGH (ref 70–99)
Glucose-Capillary: 143 mg/dL — ABNORMAL HIGH (ref 70–99)

## 2019-07-02 LAB — CBC
HCT: 33.9 % — ABNORMAL LOW (ref 36.0–46.0)
Hemoglobin: 10.4 g/dL — ABNORMAL LOW (ref 12.0–15.0)
MCH: 28.7 pg (ref 26.0–34.0)
MCHC: 30.7 g/dL (ref 30.0–36.0)
MCV: 93.4 fL (ref 80.0–100.0)
Platelets: 163 10*3/uL (ref 150–400)
RBC: 3.63 MIL/uL — ABNORMAL LOW (ref 3.87–5.11)
RDW: 13.6 % (ref 11.5–15.5)
WBC: 7 10*3/uL (ref 4.0–10.5)
nRBC: 0 % (ref 0.0–0.2)

## 2019-07-02 LAB — BASIC METABOLIC PANEL
Anion gap: 9 (ref 5–15)
BUN: 19 mg/dL (ref 8–23)
CO2: 23 mmol/L (ref 22–32)
Calcium: 8.4 mg/dL — ABNORMAL LOW (ref 8.9–10.3)
Chloride: 109 mmol/L (ref 98–111)
Creatinine, Ser: 1.31 mg/dL — ABNORMAL HIGH (ref 0.44–1.00)
GFR calc Af Amer: 42 mL/min — ABNORMAL LOW (ref >60)
GFR calc non Af Amer: 36 mL/min — ABNORMAL LOW (ref >60)
Glucose, Bld: 142 mg/dL — ABNORMAL HIGH (ref 70–99)
Potassium: 3.9 mmol/L (ref 3.5–5.1)
Sodium: 141 mmol/L (ref 135–145)

## 2019-07-02 NOTE — Progress Notes (Signed)
PROGRESS NOTE    Renee [name]  IFO:[id number] DOB: 1929/02/20 DOA: 06/30/2019 PCP: Renee Greenspan, MD   Brief Narrative:  Per HPI: Renee [name] a 85 y.o.femalewithhistory of stroke with left-sided weakness uses walker to ambulate, diabetes mellitus type 2, hypertension, chronic kidney disease stage IV was brought to the ER after patient started developing some nausea vomiting and fever at home after eating supper. Symptoms started happening around 5 PM yesterday. Denies any chest pain shortness of breath productive cough or diarrhea. Denies any abdominal pain.  ED Course:In the ER patient was febrile with temperature of 101 F lab work showed leukocytosis of 17,000 lactate was 2 patient's lab work otherwise show baseline creatinine of 1.5 blood glucose 307 UA is concerning for UTI. CT abdomen pelvis done shows nothing acute abdomen but does show bilateral pneumonia and COVID-19 test was negative. Given the septic-like picture patient was given fluid bolus and empiric antibiotics for pneumonia and admitted for further management.  11/12: Patient seen and evaluated with no complaints or concerns noted.  Urine culture with gram-negative rods noted.  Will need to follow ID and sensitivity and anticipate discharge in a.m. once these have resulted.  Continue current antibiotics.  Assessment & Plan:   Principal Problem:   Sepsis ([place]) Active Problems:   Type 2 diabetes, controlled, with retinopathy (Renee [name])   Essential hypertension   Sleep apnea   Community acquired pneumonia   History of CVA (cerebrovascular accident)   CKD (chronic kidney disease) stage 4, GFR 15-29 ml/min (Renee [name])   1. Sepsis secondary to community-acquired pneumonia and gram-negative rod UTI for which patient is on empiric antibiotics follow cultures. Patient did receive IV fluids per sepsis protocol. Given the chest x-ray shows possibility of congestion will hold off further fluids until lactate is worsening.  Follow  urine ID and sensitivity and anticipate discharge in a.m. once these are resulted.  Some weakness.  Will order PT evaluation.  She has had home health previously and uses walker. 2. Nausea vomiting CT abdomen unremarkable abdomen appears benign. LFTs are normal. Likely from pneumonia.  Nausea and vomiting has resolved today. 3. Diabetes mellitus type 2 we will keep patient on sliding scale coverage.  No significant hyperglycemia noted. 4. Hypertension-stable.  Patient is on Toprol hydralazine amlodipine and Mavik. 5. History of stroke on Lopid and aspirin. 6. Chronic kidney disease stage IV creatinine appears to be at baseline. Note that patient is on ACE inhibitor. 7. Sleep apnea per chart.   DVT prophylaxis: Heparin Code Status: Full Family Communication: Discussed with sister-in-law on 11/11 Disposition Plan: Continue antibiotics until full ID and sensitivities return for urine culture.   Consultants:   None  Procedures:   None  Antimicrobials:  Anti-infectives (From admission, onward)   Start     Dose/Rate Route Frequency Ordered Stop   07/02/19 1800  cefTRIAXone (ROCEPHIN) 2 g in sodium chloride 0.9 % 100 mL IVPB     2 g 200 mL/hr over 30 Minutes Intravenous Every 24 hours 07/01/19 0526 07/07/19 1759   07/02/19 0800  azithromycin (ZITHROMAX) 500 mg in sodium chloride 0.9 % 250 mL IVPB     500 mg 250 mL/hr over 60 Minutes Intravenous Every 24 hours 07/01/19 0526 07/07/19 0759   07/01/19 0430  cefTRIAXone (ROCEPHIN) 1 g in sodium chloride 0.9 % 100 mL IVPB     1 g 200 mL/hr over 30 Minutes Intravenous  Once 07/01/19 0428 07/01/19 0554   07/01/19 0430  azithromycin (ZITHROMAX) 500 mg  in sodium chloride 0.9 % 250 mL IVPB     500 mg 250 mL/hr over 60 Minutes Intravenous  Once 07/01/19 0428 07/01/19 0657        Subjective: Patient seen and evaluated today with no new acute complaints or concerns. No acute concerns or events noted overnight.  She overall appears to be  feeling some better.  Complains of weakness.  Objective: Vitals:   07/01/19 2047 07/02/19 0500 07/02/19 0800 07/02/19 0857  BP: (!) 169/59 (!) 157/62 (!) 121/54 (!) 144/53  Pulse: 64 (!) 58 (!) 56 (!) 55  Resp: 18 18 16 18   Temp: 97.7 F (36.5 C) 97.7 F (36.5 C) 97.6 F (36.4 C) 97.6 F (36.4 C)  TempSrc: Oral Oral Oral Oral  SpO2: 96% 94% 95% 96%    Intake/Output Summary (Last 24 hours) at 07/02/2019 0350 Last data filed at 07/02/2019 0600 Gross per 24 hour  Intake 360 ml  Output 500 ml  Net -140 ml   There were no vitals filed for this visit.  Examination:  General exam: Appears calm and comfortable  Respiratory system: Clear to auscultation. Respiratory effort normal. Cardiovascular system: S1 & S2 heard, RRR. No JVD, murmurs, rubs, gallops or clicks. No pedal edema. Gastrointestinal system: Abdomen is nondistended, soft and nontender. No organomegaly or masses felt. Normal bowel sounds heard. Central nervous system: Alert and oriented. No focal neurological deficits. Extremities: Symmetric 5 x 5 power. Skin: No rashes, lesions or ulcers Psychiatry: Judgement and insight appear normal. Mood & affect appropriate.     Data Reviewed: I have personally reviewed following labs and imaging studies  CBC: Recent Labs  Lab 06/30/19 2118 07/01/19 0821 07/02/19 0450  WBC 17.6* 11.2* 7.0  NEUTROABS 16.3*  --   --   HGB 12.3 9.9* 10.4*  HCT 39.8 32.3* 33.9*  MCV 91.5 92.3 93.4  PLT 197 151 093   Basic Metabolic Panel: Recent Labs  Lab 06/30/19 2118 07/01/19 0821 07/02/19 0450  NA 138  --  141  K 4.2  --  3.9  CL 104  --  109  CO2 22  --  23  GLUCOSE 307*  --  142*  BUN 28*  --  19  CREATININE 1.59* 1.25* 1.31*  CALCIUM 9.0  --  8.4*   GFR: CrCl cannot be calculated (Unknown ideal weight.). Liver Function Tests: Recent Labs  Lab 06/30/19 2118  AST 21  ALT 13  ALKPHOS 90  BILITOT 0.5  PROT 6.0*  ALBUMIN 3.7   Recent Labs  Lab 06/30/19 2118   LIPASE 20   No results for input(s): AMMONIA in the last 168 hours. Coagulation Profile: Recent Labs  Lab 06/30/19 2118  INR 1.0   Cardiac Enzymes: No results for input(s): CKTOTAL, CKMB, CKMBINDEX, TROPONINI in the last 168 hours. BNP (last 3 results) No results for input(s): PROBNP in the last 8760 hours. HbA1C: No results for input(s): HGBA1C in the last 72 hours. CBG: Recent Labs  Lab 07/01/19 0818 07/01/19 1422 07/01/19 1811 07/01/19 2044 07/02/19 0722  GLUCAP 149* 138* 76 188* 123*   Lipid Profile: No results for input(s): CHOL, HDL, LDLCALC, TRIG, CHOLHDL, LDLDIRECT in the last 72 hours. Thyroid Function Tests: No results for input(s): TSH, T4TOTAL, FREET4, T3FREE, THYROIDAB in the last 72 hours. Anemia Panel: No results for input(s): VITAMINB12, FOLATE, FERRITIN, TIBC, IRON, RETICCTPCT in the last 72 hours. Sepsis Labs: Recent Labs  Lab 07/01/19 0054 07/01/19 0435 07/01/19 0821  LATICACIDVEN 2.0* 1.6 1.1  Recent Results (from the past 240 hour(s))  SARS CORONAVIRUS 2 (TAT 6-24 HRS) Nasopharyngeal Nasopharyngeal Swab     Status: None   Collection Time: 06/30/19  9:12 PM   Specimen: Nasopharyngeal Swab  Result Value Ref Range Status   SARS Coronavirus 2 NEGATIVE NEGATIVE Final    Comment: (NOTE) SARS-CoV-2 target nucleic acids are NOT DETECTED. The SARS-CoV-2 RNA is generally detectable in upper and lower respiratory specimens during the acute phase of infection. Negative results do not preclude SARS-CoV-2 infection, do not rule out co-infections with other pathogens, and should not be used as the sole basis for treatment or other patient management decisions. Negative results must be combined with clinical observations, patient history, and epidemiological information. The expected result is Negative. Fact Sheet for Patients: SugarRoll.be Fact Sheet for Healthcare Providers: https://www.woods-mathews.com/  This test is not yet approved or cleared by the [place] FDA and  has been authorized for detection and/or diagnosis of SARS-CoV-2 by FDA under an Emergency Use Authorization (EUA). This EUA will remain  in effect (meaning this test can be used) for the duration of the COVID-19 declaration under Section 56 4(b)(1) of the Act, 21 U.S.C. section 360bbb-3(b)(1), unless the authorization is terminated or revoked sooner. Performed at [facility], [place] [address], [place], [place] [address]   Culture, blood (routine x 2)     Status: None (Preliminary result)   Collection Time: 07/01/19 12:54 AM   Specimen: BLOOD RIGHT HAND  Result Value Ref Range Status   Specimen Description BLOOD RIGHT HAND  Final   Special Requests   Final    BOTTLES DRAWN AEROBIC AND ANAEROBIC Blood Culture results may not be optimal due to an inadequate volume of blood received in culture bottles   Culture   Final    NO GROWTH 1 DAY Performed at [facility], [place] [address] [place], [place] [address]    Report Status PENDING  Incomplete  Urine culture     Status: Abnormal (Preliminary result)   Collection Time: 07/01/19  2:45 AM   Specimen: Urine, Random  Result Value Ref Range Status   Specimen Description URINE, RANDOM  Final   Special Requests   Final    NONE Performed at [facility], [place] [address], [place], [place] [address]    Culture >=100,000 COLONIES/mL GRAM NEGATIVE RODS (A)  Final   Report Status PENDING  Incomplete  Culture, blood (routine x 2)     Status: None (Preliminary result)   Collection Time: 07/01/19  4:35 AM   Specimen: BLOOD LEFT HAND  Result Value Ref Range Status   Specimen Description BLOOD LEFT HAND  Final   Special Requests   Final    BOTTLES DRAWN AEROBIC AND ANAEROBIC Blood Culture adequate volume   Culture   Final    NO GROWTH 1 DAY Performed at [facility], [place] [address], [place],  [address]    Report Status PENDING  Incomplete          Radiology Studies: Ct Abdomen Pelvis Wo Contrast  Result Date: 07/01/2019 CLINICAL DATA:  Nausea and vomiting beginning at 5 p.m. yesterday. EXAM: CT ABDOMEN AND PELVIS WITHOUT CONTRAST TECHNIQUE: Multidetector CT imaging of the abdomen and pelvis was performed following the standard protocol without IV contrast. COMPARISON:  CT of the abdomen and pelvis 12/16/2017 FINDINGS: Lower chest: Patchy airspace opacities are present at the left base. Mild ground-glass attenuation is present at the medial right base as  well. A small pericardial effusion is chronic. Coronary artery calcifications are present. The heart is mildly enlarged. Hepatobiliary: No focal liver abnormality is seen. No gallstones, gallbladder wall thickening, or biliary dilatation. Pancreas: Unremarkable. No pancreatic ductal dilatation or surrounding inflammatory changes. Spleen: Normal in size without focal abnormality. Adrenals/Urinary Tract: Adrenal glands are normal bilaterally. Water density lesions of the left kidney are stable, consistent with cysts. No stone or solid mass lesion is evident. Ureters are within normal limits. The urinary bladder is unremarkable. Stomach/Bowel: A moderate-sized hiatal hernia is again noted. Distal esophagus is unremarkable. Stomach and duodenum are otherwise within normal limits. Small mesenteric lymph nodes are similar the prior study. No focal inflammatory changes are present in the small bowel. There is no obstruction. Terminal ileum is within normal limits. The ascending and transverse colon are normal. Descending colon is within normal limits. Diverticular changes are present in the sigmoid colon. Vascular/Lymphatic: Atherosclerotic calcifications are present in the aorta and branch vessels. There is no aneurysm or significant change. Reproductive: Status post hysterectomy. No adnexal masses. Other: No abdominal wall hernia or abnormality. No abdominopelvic ascites. Musculoskeletal: Remote  superior endplate fracture of L4 is stable. Superior endplate Schmorl's node at L2 is stable. Vertebral body heights are otherwise maintained. No acute or healing fractures are present. No focal lesions are evident. The hips are located and within normal limits. IMPRESSION: 1. Left greater than right lower lobe airspace disease is concerning for pneumonia. 2. No acute or focal abdominal lesion to explain the patient's symptoms. 3. Stable moderate-sized hiatal hernia. 4. Sigmoid diverticulosis without diverticulitis. 5. Aortic Atherosclerosis (ICD10-I70.0). 6. Coronary artery disease. 7. Stable small pericardial effusion. Electronically Signed   By: San Morelle M.D.   On: 07/01/2019 04:22   Dg Chest Port 1 View  Result Date: 06/30/2019 CLINICAL DATA:  Cough and fever EXAM: PORTABLE CHEST 1 VIEW COMPARISON:  12/11/2016 FINDINGS: Mild cardiomegaly is again seen but stable. The lungs are well aerated bilaterally. No focal infiltrate or sizable effusion is seen. Mild central vascular congestion is noted with early interstitial edema. IMPRESSION: Mild changes of congestive failure. Electronically Signed   By: [name] M.D.   On: 06/30/2019 21:50        Scheduled Meds: . amLODipine  10 mg Oral Daily  . aspirin  325 mg Oral Daily  . docusate sodium  200 mg Oral QHS  . ferrous sulfate  325 mg Oral BID WC  . gemfibrozil  600 mg Oral QAC breakfast  . heparin  5,000 Units Subcutaneous Q8H  . hydrALAZINE  100 mg Oral Q8H  . insulin aspart  0-9 Units Subcutaneous TID WC  . metoprolol succinate  12.5 mg Oral BID  . mirtazapine  7.5 mg Oral QHS  . sertraline  50 mg Oral Daily  . trandolapril  4 mg Oral Daily  . vitamin B-12  1,000 mcg Oral Daily   Continuous Infusions: . azithromycin    . cefTRIAXone (ROCEPHIN)  IV       LOS: 1 day    Time spent: 30 minutes    Chanoch Mccleery Darleen Crocker, DO [facility] Pager [phone]  If 7PM-7AM, please contact night-coverage [email or web address]  Password [facility] 07/02/2019, 9:36 AM

## 2019-07-02 NOTE — Progress Notes (Signed)
PT Cancellation Note  Patient Details Name: Renee [name] MRN: [id number] DOB: [birth year]   Cancelled Treatment:    Reason Eval/Treat Not Completed: Other (comment).  Pt is eating her lunch, HOH, has agreed to do therapy when she is done.  Return as time and pt allow.   Ramond Dial 07/02/2019, 12:52 PM   Mee Hives, PT MS Acute Rehab Dept. Number: [place] and [place]

## 2019-07-02 NOTE — Evaluation (Signed)
Physical Therapy Evaluation Patient Details Name: Renee [name] MRN: [id number] DOB: [birth year] Today's Date: 07/02/2019   History of Present Illness  85 yo female with onset of N&V at home was admitted, fever and symptoms of UTI present.  Covid (-) but has PNA.  Noted CHF and pericardial effusion wiht imaging.  PMHx:  Hiatal hernia, diverticulosis, CAD, retinopathy, DM, CVA, apnea  Clinical Impression  Pt was seen for mobility with RW, assisted to get from the bed to [facility] and back to bed.  Pt will be seen acutely for progression of mobility and strengthening ex's, to train balance, and to work on control of dynamic posture of gait.  Pt is agreeable with going to rehab, and will pursue this with SW and other staff to increase independence and safety.    Follow Up Recommendations SNF    Equipment Recommendations  Rolling walker with 5" wheels    Recommendations for Other Services       Precautions / Restrictions Precautions Precautions: Fall Precaution Comments: requires assistance to stand and balance sitting  Restrictions Weight Bearing Restrictions: No      Mobility  Bed Mobility Overal bed mobility: Needs Assistance Bed Mobility: Supine to Sit;Sit to Supine     Supine to sit: Mod assist Sit to supine: Mod assist   General bed mobility comments: using bed rail and dense cues from PT   Transfers Overall transfer level: Needs assistance Equipment used: Rolling walker (2 wheeled);1 person hand held assist Transfers: Sit to/from [facility] Sit to Stand: Mod assist Stand pivot transfers: Mod assist       General transfer comment: mod assist to stand with dense cues for sequence and control of posture  Ambulation/Gait Ambulation/Gait assistance: Mod assist Gait Distance (Feet): 4 Feet Assistive device: Rolling walker (2 wheeled);1 person hand held assist Gait Pattern/deviations: Step-to pattern;Decreased stride length;Wide base of support;Trunk  flexed Gait velocity: reduced      Stairs            Wheelchair Mobility    Modified Rankin (Stroke Patients Only)       Balance Overall balance assessment: Needs assistance Sitting-balance support: Feet supported Sitting balance-Leahy Scale: Fair     Standing balance support: Bilateral upper extremity supported;During functional activity Standing balance-Leahy Scale: Poor                               Pertinent Vitals/Pain Pain Assessment: No/denies pain    Home Living Family/patient expects to be discharged to:: Private residence Living Arrangements: Alone Available Help at Discharge: Family;Available PRN/intermittently;Personal care attendant(6 hours of caregiver help a day) Type of Home: House Home Access: Ramped entrance     Home Layout: One level Home Equipment: None Additional Comments: lived alone and was able to ambulate with no assist, but had caregiver help for 6 hours a day    Prior Function Level of Independence: Independent         Comments: caregiver help from 9-3     Hand Dominance   Dominant Hand: Right    Extremity/Trunk Assessment   Upper Extremity Assessment Upper Extremity Assessment: Generalized weakness    Lower Extremity Assessment Lower Extremity Assessment: Generalized weakness    Cervical / Trunk Assessment Cervical / Trunk Assessment: Kyphotic  Communication   Communication: HOH  Cognition Arousal/Alertness: Awake/alert Behavior During Therapy: WFL for tasks assessed/performed Overall Cognitive Status: Within Functional Limits for tasks assessed  General Comments: has family to assist with information      General Comments General comments (skin integrity, edema, etc.): pt was seenfor movement to attempt gait and was unable to manage walking away from the bed due to buckling at the knees    Exercises     Assessment/Plan    PT Assessment Patient  needs continued PT services  PT Problem List Decreased strength;Decreased range of motion;Decreased activity tolerance;Decreased balance;Decreased mobility;Decreased coordination;Decreased knowledge of use of DME       PT Treatment Interventions DME instruction;Gait training;Stair training;Functional mobility training;Therapeutic activities;Therapeutic exercise;Balance training;Neuromuscular re-education;Patient/family education    PT Goals (Current goals can be found in the Care Plan section)  Acute Rehab PT Goals Patient Stated Goal: to go home and walk again PT Goal Formulation: With patient/family Time For Goal Achievement: 07/16/19 Potential to Achieve Goals: Good    Frequency Min 2X/week   Barriers to discharge Decreased caregiver support has no consisten help 24/7    Co-evaluation               AM-PAC PT "6 Clicks" Mobility  Outcome Measure Help needed turning from your back to your side while in a flat bed without using bedrails?: A Lot Help needed moving from lying on your back to sitting on the side of a flat bed without using bedrails?: A Lot Help needed moving to and from a bed to a chair (including a wheelchair)?: A Lot Help needed standing up from a chair using your arms (e.g., wheelchair or bedside chair)?: A Lot Help needed to walk in hospital room?: A Lot Help needed climbing 3-5 steps with a railing? : Total 6 Click Score: 11    End of Session Equipment Utilized During Treatment: Gait belt Activity Tolerance: Patient tolerated treatment well;Patient limited by fatigue Patient left: in bed;with call bell/phone within reach;with bed alarm set;with family/visitor present Nurse Communication: Mobility status PT Visit Diagnosis: Unsteadiness on feet (R26.81);Muscle weakness (generalized) (M62.81);Difficulty in walking, not elsewhere classified (R26.2);History of falling (Z91.81)    Time: 4944-9675 PT Time Calculation (min) (ACUTE ONLY): 29 min   Charges:    PT Evaluation $PT Eval Moderate Complexity: 1 Mod PT Treatments $Therapeutic Activity: 8-22 mins       Ramond Dial 07/02/2019, 7:34 PM   Mee Hives, PT MS Acute Rehab Dept. Number: [place] and [place]

## 2019-07-02 NOTE — Plan of Care (Signed)
  Problem: Clinical Measurements: Goal: Diagnostic test results will improve Outcome: Progressing   

## 2019-07-03 LAB — CBC
HCT: 33.7 % — ABNORMAL LOW (ref 36.0–46.0)
Hemoglobin: 10.6 g/dL — ABNORMAL LOW (ref 12.0–15.0)
MCH: 28.5 pg (ref 26.0–34.0)
MCHC: 31.5 g/dL (ref 30.0–36.0)
MCV: 90.6 fL (ref 80.0–100.0)
Platelets: 169 10*3/uL (ref 150–400)
RBC: 3.72 MIL/uL — ABNORMAL LOW (ref 3.87–5.11)
RDW: 13.3 % (ref 11.5–15.5)
WBC: 6.5 10*3/uL (ref 4.0–10.5)
nRBC: 0 % (ref 0.0–0.2)

## 2019-07-03 LAB — URINE CULTURE: Culture: >=100000 — AB

## 2019-07-03 LAB — BASIC METABOLIC PANEL
Anion gap: 9 (ref 5–15)
BUN: 16 mg/dL (ref 8–23)
CO2: 25 mmol/L (ref 22–32)
Calcium: 8.5 mg/dL — ABNORMAL LOW (ref 8.9–10.3)
Chloride: 108 mmol/L (ref 98–111)
Creatinine, Ser: 1.26 mg/dL — ABNORMAL HIGH (ref 0.44–1.00)
GFR calc Af Amer: 44 mL/min — ABNORMAL LOW (ref >60)
GFR calc non Af Amer: 38 mL/min — ABNORMAL LOW (ref >60)
Glucose, Bld: 155 mg/dL — ABNORMAL HIGH (ref 70–99)
Potassium: 3.9 mmol/L (ref 3.5–5.1)
Sodium: 142 mmol/L (ref 135–145)

## 2019-07-03 LAB — GLUCOSE, CAPILLARY
Glucose-Capillary: 144 mg/dL — ABNORMAL HIGH (ref 70–99)
Glucose-Capillary: 176 mg/dL — ABNORMAL HIGH (ref 70–99)
Glucose-Capillary: 125 mg/dL — ABNORMAL HIGH (ref 70–99)
Glucose-Capillary: 191 mg/dL — ABNORMAL HIGH (ref 70–99)

## 2019-07-03 MED ORDER — SULFAMETHOXAZOLE-TRIMETHOPRIM 800-160 MG PO TABS
1.0000 | ORAL_TABLET | Freq: Two times a day (BID) | ORAL | Status: DC
  Administered 2019-07-03 – 2019-07-08 (×10): 1 via ORAL
  Filled 2019-07-03 (×10): qty 1

## 2019-07-03 MED ORDER — AZITHROMYCIN 500 MG PO TABS
500.0000 mg | ORAL_TABLET | Freq: Every day | ORAL | Status: AC
  Administered 2019-07-03 – 2019-07-06 (×4): 500 mg via ORAL
  Filled 2019-07-03 (×4): qty 1

## 2019-07-03 NOTE — Progress Notes (Signed)
PROGRESS NOTE    Renee [name]  GNF:[id number] DOB: [birth year] DOA: 06/30/2019 PCP: Abner Greenspan, MD   Brief Narrative:  Per HPI: Renee [name] a 85 y.o.femalewithhistory of stroke with left-sided weakness uses walker to ambulate, diabetes mellitus type 2, hypertension, chronic kidney disease stage IV was brought to the ER after patient started developing some nausea vomiting and fever at home after eating supper. Symptoms started happening around 5 PM yesterday. Denies any chest pain shortness of breath productive cough or diarrhea. Denies any abdominal pain.  ED Course:In the ER patient was febrile with temperature of 101 F lab work showed leukocytosis of 17,000 lactate was 2 patient's lab work otherwise show baseline creatinine of 1.5 blood glucose 307 UA is concerning for UTI. CT abdomen pelvis done shows nothing acute abdomen but does show bilateral pneumonia and COVID-19 test was negative. Given the septic-like picture patient was given fluid bolus and empiric antibiotics for pneumonia and admitted for further management.  11/12: Patient seen and evaluated with no complaints or concerns noted.  Urine culture with gram-negative rods noted.  Will need to follow ID and sensitivity and anticipate discharge in a.m. once these have resulted.  Continue current antibiotics.  11/13: Patient noted to have E. coli UTI with further sensitivities pending.  Will convert IV of azithromycin to oral and maintain on Rocephin until further sensitivities return.  OT evaluation also ordered as requested by PT.  Patient agreeable to discharge to SNF when bed available.  Assessment & Plan:   Principal Problem:   Sepsis ([place]) Active Problems:   Type 2 diabetes, controlled, with retinopathy ([place])   Essential hypertension   Sleep apnea   Community acquired pneumonia   History of CVA (cerebrovascular accident)   CKD (chronic kidney disease) stage 4, GFR 15-29 ml/min ([place])   1. Sepsis  secondary to community-acquired pneumonia and gram-negative rod UTI for which patient is on empiric antibiotics follow cultures. Patient did receive IV fluids per sepsis protocol.  Follow urine ID and sensitivity and anticipate discharge in a.m. once these are resulted.  Some weakness for which PT is recommending SNF.  We will also order OT evaluation.  Anticipate discharge once further sensitivities return and SNF established. 2. Nausea vomiting CT abdomen unremarkable abdomen appears benign. LFTs are normal. Likely from pneumonia.  Nausea and vomiting has resolved; tolerating diet. 3. Diabetes mellitus type 2 we will keep patient on sliding scale coverage.  No significant hyperglycemia noted. 4. Hypertension-stable.  Patient is on Toprol hydralazine amlodipine and Mavik. 5. History of stroke on Lopid and aspirin. 6. Chronic kidney disease stage IV creatinine appears to be at baseline. Note that patient is on ACE inhibitor. 7. Sleep apnea per chart.   DVT prophylaxis: Heparin Code Status: Full Family Communication: Discussed with sister-in-law on 11/11 Disposition Plan: Continue antibiotics until full ID and sensitivities return for urine culture.  Discharge to SNF once bed available.   Consultants:   None  Procedures:   None  Antimicrobials:  Anti-infectives (From admission, onward)   Start     Dose/Rate Route Frequency Ordered Stop   07/03/19 1000  azithromycin (ZITHROMAX) tablet 500 mg     500 mg Oral Daily 07/03/19 0850 07/07/19 0959   07/02/19 1800  cefTRIAXone (ROCEPHIN) 2 g in sodium chloride 0.9 % 100 mL IVPB     2 g 200 mL/hr over 30 Minutes Intravenous Every 24 hours 07/01/19 0526 07/07/19 1759   07/02/19 0800  azithromycin (ZITHROMAX) 500 mg in sodium  chloride 0.9 % 250 mL IVPB  Status:  Discontinued     500 mg 250 mL/hr over 60 Minutes Intravenous Every 24 hours 07/01/19 0526 07/03/19 0850   07/01/19 0430  cefTRIAXone (ROCEPHIN) 1 g in sodium chloride 0.9 % 100 mL  IVPB     1 g 200 mL/hr over 30 Minutes Intravenous  Once 07/01/19 0428 07/01/19 0554   07/01/19 0430  azithromycin (ZITHROMAX) 500 mg in sodium chloride 0.9 % 250 mL IVPB     500 mg 250 mL/hr over 60 Minutes Intravenous  Once 07/01/19 0428 07/01/19 0657       Subjective: Patient seen and evaluated today with no new acute complaints or concerns. No acute concerns or events noted overnight.  Objective: Vitals:   07/02/19 1624 07/02/19 2057 07/03/19 0519 07/03/19 1005  BP: (!) 169/72 (!) 181/92 (!) 184/73 (!) 167/62  Pulse: 65 62 65 63  Resp: 18 18 18 16   Temp: 97.7 F (36.5 C) 98.7 F (37.1 C) 97.9 F (36.6 C) 98.2 F (36.8 C)  TempSrc: Oral   Oral  SpO2: 96% 96% 100% 97%    Intake/Output Summary (Last 24 hours) at 07/03/2019 1047 Last data filed at 07/03/2019 0519 Gross per 24 hour  Intake 600 ml  Output 1900 ml  Net -1300 ml   There were no vitals filed for this visit.  Examination:  General exam: Appears calm and comfortable  Respiratory system: Clear to auscultation. Respiratory effort normal. Cardiovascular system: S1 & S2 heard, RRR. No JVD, murmurs, rubs, gallops or clicks. No pedal edema. Gastrointestinal system: Abdomen is nondistended, soft and nontender. No organomegaly or masses felt. Normal bowel sounds heard. Central nervous system: Alert and oriented. No focal neurological deficits. Extremities: Symmetric 5 x 5 power. Skin: No rashes, lesions or ulcers Psychiatry: Judgement and insight appear normal. Mood & affect appropriate.     Data Reviewed: I have personally reviewed following labs and imaging studies  CBC: Recent Labs  Lab 06/30/19 2118 07/01/19 0821 07/02/19 0450 07/03/19 0535  WBC 17.6* 11.2* 7.0 6.5  NEUTROABS 16.3*  --   --   --   HGB 12.3 9.9* 10.4* 10.6*  HCT 39.8 32.3* 33.9* 33.7*  MCV 91.5 92.3 93.4 90.6  PLT 197 151 163 094   Basic Metabolic Panel: Recent Labs  Lab 06/30/19 2118 07/01/19 0821 07/02/19 0450 07/03/19  0535  NA 138  --  141 142  K 4.2  --  3.9 3.9  CL 104  --  109 108  CO2 22  --  23 25  GLUCOSE 307*  --  142* 155*  BUN 28*  --  19 16  CREATININE 1.59* 1.25* 1.31* 1.26*  CALCIUM 9.0  --  8.4* 8.5*   GFR: CrCl cannot be calculated (Unknown ideal weight.). Liver Function Tests: Recent Labs  Lab 06/30/19 2118  AST 21  ALT 13  ALKPHOS 90  BILITOT 0.5  PROT 6.0*  ALBUMIN 3.7   Recent Labs  Lab 06/30/19 2118  LIPASE 20   No results for input(s): AMMONIA in the last 168 hours. Coagulation Profile: Recent Labs  Lab 06/30/19 2118  INR 1.0   Cardiac Enzymes: No results for input(s): CKTOTAL, CKMB, CKMBINDEX, TROPONINI in the last 168 hours. BNP (last 3 results) No results for input(s): PROBNP in the last 8760 hours. HbA1C: No results for input(s): HGBA1C in the last 72 hours. CBG: Recent Labs  Lab 07/02/19 0722 07/02/19 1120 07/02/19 1621 07/02/19 2056 07/03/19 0657  GLUCAP 123*  165* 184* 143* 144*   Lipid Profile: No results for input(s): CHOL, HDL, LDLCALC, TRIG, CHOLHDL, LDLDIRECT in the last 72 hours. Thyroid Function Tests: No results for input(s): TSH, T4TOTAL, FREET4, T3FREE, THYROIDAB in the last 72 hours. Anemia Panel: No results for input(s): VITAMINB12, FOLATE, FERRITIN, TIBC, IRON, RETICCTPCT in the last 72 hours. Sepsis Labs: Recent Labs  Lab 07/01/19 0054 07/01/19 0435 07/01/19 0821  LATICACIDVEN 2.0* 1.6 1.1    Recent Results (from the past 240 hour(s))  SARS CORONAVIRUS 2 (TAT 6-24 HRS) Nasopharyngeal Nasopharyngeal Swab     Status: None   Collection Time: 06/30/19  9:12 PM   Specimen: Nasopharyngeal Swab  Result Value Ref Range Status   SARS Coronavirus 2 NEGATIVE NEGATIVE Final    Comment: (NOTE) SARS-CoV-2 target nucleic acids are NOT DETECTED. The SARS-CoV-2 RNA is generally detectable in upper and lower respiratory specimens during the acute phase of infection. Negative results do not preclude SARS-CoV-2 infection, do not rule  out co-infections with other pathogens, and should not be used as the sole basis for treatment or other patient management decisions. Negative results must be combined with clinical observations, patient history, and epidemiological information. The expected result is Negative. Fact Sheet for Patients: SugarRoll.be Fact Sheet for Healthcare Providers: https://www.woods-mathews.com/ This test is not yet approved or cleared by the [place] FDA and  has been authorized for detection and/or diagnosis of SARS-CoV-2 by FDA under an Emergency Use Authorization (EUA). This EUA will remain  in effect (meaning this test can be used) for the duration of the COVID-19 declaration under Section 56 4(b)(1) of the Act, 21 U.S.C. section 360bbb-3(b)(1), unless the authorization is terminated or revoked sooner. Performed at [facility], [place] [address], [place], [place] [address]   Culture, blood (routine x 2)     Status: None (Preliminary result)   Collection Time: 07/01/19 12:54 AM   Specimen: BLOOD RIGHT HAND  Result Value Ref Range Status   Specimen Description BLOOD RIGHT HAND  Final   Special Requests   Final    BOTTLES DRAWN AEROBIC AND ANAEROBIC Blood Culture results may not be optimal due to an inadequate volume of blood received in culture bottles   Culture   Final    NO GROWTH 1 DAY Performed at [facility], [place] [address], [place], [place] [address]    Report Status PENDING  Incomplete  Urine culture     Status: Abnormal (Preliminary result)   Collection Time: 07/01/19  2:45 AM   Specimen: Urine, Random  Result Value Ref Range Status   Specimen Description URINE, RANDOM  Final   Special Requests NONE  Final   Culture (A)  Final    >=100,000 COLONIES/mL ESCHERICHIA COLI SUSCEPTIBILITIES TO FOLLOW CULTURE REINCUBATED FOR BETTER GROWTH Performed at [facility], [place] [address], [place], [place] [address]    Report  Status PENDING  Incomplete  Culture, blood (routine x 2)     Status: None (Preliminary result)   Collection Time: 07/01/19  4:35 AM   Specimen: BLOOD LEFT HAND  Result Value Ref Range Status   Specimen Description BLOOD LEFT HAND  Final   Special Requests   Final    BOTTLES DRAWN AEROBIC AND ANAEROBIC Blood Culture adequate volume   Culture   Final    NO GROWTH 1 DAY Performed at [facility], [place] [address] [place], [place] [address]    Report Status PENDING  Incomplete  Radiology Studies: No results found.      Scheduled Meds: . amLODipine  10 mg Oral Daily  . aspirin  325 mg Oral Daily  . azithromycin  500 mg Oral Daily  . docusate sodium  200 mg Oral QHS  . ferrous sulfate  325 mg Oral BID WC  . gemfibrozil  600 mg Oral QAC breakfast  . heparin  5,000 Units Subcutaneous Q8H  . hydrALAZINE  100 mg Oral Q8H  . insulin aspart  0-9 Units Subcutaneous TID WC  . metoprolol succinate  12.5 mg Oral BID  . mirtazapine  7.5 mg Oral QHS  . sertraline  50 mg Oral Daily  . trandolapril  4 mg Oral Daily  . vitamin B-12  1,000 mcg Oral Daily   Continuous Infusions: . cefTRIAXone (ROCEPHIN)  IV 2 g (07/02/19 1737)     LOS: 2 days    Time spent: 30 minutes    Emmet Messer Darleen Crocker, DO [facility] Pager [phone]  If 7PM-7AM, please contact night-coverage [email or web address] Password [facility] 07/03/2019, 10:47 AM

## 2019-07-03 NOTE — TOC Initial Note (Addendum)
Transition of Care [facility]) - Initial/Assessment Note  SNF Declined - Patient/family request CIR   Patient Details  Name: Renee [name] MRN: [id number] Date of Birth: 06/10/1929  Transition of [facility]) CM/SW Contact:    Sable Feil, LCSW Phone Number: 07/03/2019, 4:14 PM  Clinical Narrative: CSW talked with patient and sister-in-law Laureen Ochs at the bedside regarding patient's discharge disposition and recommendation of PT/OT for SNF. Ms. Pilkenton was in bed and was awake and alert. She allowed Ms. Johnson to be actively involved in the conversation. CSW advised that they want inpatient rehab and discussed this with PT Rod Holler. CSW advised that SNF is not an option as they have had experience in the recent past with Regina's brother going to SNF and her brother or family did not feel he got what he needed from rehab, and if CIR can't take her, she will discharge home. Per Ms. Johnson, if CIR cant take patient, she will discharge home.                Expected Discharge Plan: [place]     Patient Goals and CMS Choice Patient states their goals for this hospitalization and ongoing recovery are:: Patient wants CIR then home CMS Medicare.gov Compare Post Acute Care list provided to:: Other (Comment Required)(SNF list not given as patient/family declining SNF) Choice offered to / list presented to : Patient(Talked with patient and sister-in-law Laureen Ochs at the bedside)  Expected Discharge Plan and Services Expected Discharge Plan: [place] or home if CIR declines patient       Living arrangements for the past 2 months: Single Family Home                                    Prior Living Arrangements/Services Living arrangements for the past 2 months: [place] with:: Self, Other (Comment)(Has a caregiver) Patient language and need for interpreter reviewed:: No Do you feel safe going back to the place where you live?: Yes       Need for Family Participation in Patient Care: Yes (Comment) Care giver support system in place?: Yes (comment)   Criminal Activity/Legal Involvement Pertinent to Current Situation/Hospitalization: No - Comment as needed  Activities of Daily Living Home Assistive Devices/Equipment: Walker (specify type) ADL Screening (condition at time of admission) Patient's cognitive ability adequate to safely complete daily activities?: Yes Is the patient deaf or have difficulty hearing?: Yes Does the patient have difficulty seeing, even when wearing glasses/contacts?: No Does the patient have difficulty concentrating, remembering, or making decisions?: No Patient able to express need for assistance with ADLs?: Yes Does the patient have difficulty dressing or bathing?: Yes Independently performs ADLs?: No Communication: Independent Dressing (OT): Needs assistance Does the patient have difficulty walking or climbing stairs?: Yes Weakness of Legs: Left Weakness of Arms/Hands: Left  Permission Sought/Granted Permission sought to share information with : Family Supports(Patient was agreeable with CSW talking with her, with sister-in-law in the room)    Share Information with NAME: Laureen Ochs     Permission granted to share info w Relationship: Sister-in-law  Permission granted to share info w Contact Information: [phone]  Emotional Assessment Appearance:: Appears stated age Attitude/Demeanor/Rapport: Engaged Affect (typically observed): Appropriate Orientation: : Oriented to Self, Oriented to Place, Oriented to  Time, Oriented to Situation Alcohol / Substance Use: Tobacco Use, Alcohol Use, Illicit Drugs(Patient reported that she does not  drink, smoke cigarettes or use illicit drugs) Psych Involvement: No (comment)  Admission diagnosis:  Renal insufficiency [N28.9] Elevated lactic acid level [R79.89] Fever, unspecified fever cause [R50.9] Urinary tract infection without hematuria,  site unspecified [N39.0] Nausea and vomiting, intractability of vomiting not specified, unspecified vomiting type [R11.2] Community acquired pneumonia, unspecified laterality [J18.9] Sepsis ([place]) [A41.9] Patient Active Problem List   Diagnosis Date Noted  . Sepsis ([place]) 07/01/2019  . CKD (chronic kidney disease) stage 4, GFR 15-29 ml/min ([facility]) 07/01/2019  . Lower abdominal pain 05/11/2019  . Routine general medical examination at a health care facility 03/30/2019  . External hemorrhoid 01/17/2018  . History of colitis 01/17/2018  . Generalized weakness 12/24/2016  . Diabetic retinopathy ([place]) 12/11/2016  . History of CVA (cerebrovascular accident) 12/11/2016  . Community acquired pneumonia 10/07/2015  . Estrogen deficiency 08/30/2015  . Encounter for Medicare annual wellness exam 04/17/2013  . Mobility impaired 06/26/2011  . History of retinal detachment 01/10/2011  . Sleep apnea 11/28/2010  . Depression with anxiety 08/25/2010  . Hemiplegia, late effect of cerebrovascular disease ([place]) 07/05/2010  . POSTHERPETIC NEURALGIA 11/09/2009  . Renal insufficiency 06/29/2008  . Chronic back pain 01/26/2008  . EDEMA 01/26/2008  . B12 deficiency 01/10/2007  . Type 2 diabetes, controlled, with retinopathy ([place]) 11/27/2006  . HLD (hyperlipidemia) 11/27/2006  . Essential hypertension 11/27/2006  . FIBROCYSTIC BREAST DISEASE 11/27/2006  . ROSACEA 11/27/2006  . OSTEOARTHRITIS 11/27/2006  . URINARY INCONTINENCE, MIXED 11/27/2006   PCP:  Abner Greenspan, MD Pharmacy:   [id number] - [place], [place] [address] [place] [address] Phone: [phone] Fax: [phone]     Social Determinants of Health (SDOH) Interventions  No SDOH interventions needed at this time.  Readmission Risk Interventions No flowsheet data found.

## 2019-07-03 NOTE — Care Management Important Message (Signed)
Important Message  Patient Details  Name: BRIUNNA LEICHT MRN: [id number] Date of Birth: 16-Oct-1928   Medicare Important Message Given:  Yes     Anjuli Gemmill 07/03/2019, 2:49 PM

## 2019-07-04 LAB — BASIC METABOLIC PANEL
Anion gap: 11 (ref 5–15)
BUN: 19 mg/dL (ref 8–23)
CO2: 25 mmol/L (ref 22–32)
Calcium: 8.6 mg/dL — ABNORMAL LOW (ref 8.9–10.3)
Chloride: 105 mmol/L (ref 98–111)
Creatinine, Ser: 1.3 mg/dL — ABNORMAL HIGH (ref 0.44–1.00)
GFR calc Af Amer: 42 mL/min — ABNORMAL LOW (ref >60)
GFR calc non Af Amer: 37 mL/min — ABNORMAL LOW (ref >60)
Glucose, Bld: 158 mg/dL — ABNORMAL HIGH (ref 70–99)
Potassium: 4.1 mmol/L (ref 3.5–5.1)
Sodium: 141 mmol/L (ref 135–145)

## 2019-07-04 LAB — CBC
HCT: 33.4 % — ABNORMAL LOW (ref 36.0–46.0)
Hemoglobin: 10.5 g/dL — ABNORMAL LOW (ref 12.0–15.0)
MCH: 28.5 pg (ref 26.0–34.0)
MCHC: 31.4 g/dL (ref 30.0–36.0)
MCV: 90.8 fL (ref 80.0–100.0)
Platelets: 175 10*3/uL (ref 150–400)
RBC: 3.68 MIL/uL — ABNORMAL LOW (ref 3.87–5.11)
RDW: 13.2 % (ref 11.5–15.5)
WBC: 5.2 10*3/uL (ref 4.0–10.5)
nRBC: 0 % (ref 0.0–0.2)

## 2019-07-04 LAB — GLUCOSE, CAPILLARY
Glucose-Capillary: 148 mg/dL — ABNORMAL HIGH (ref 70–99)
Glucose-Capillary: 237 mg/dL — ABNORMAL HIGH (ref 70–99)
Glucose-Capillary: 146 mg/dL — ABNORMAL HIGH (ref 70–99)
Glucose-Capillary: 139 mg/dL — ABNORMAL HIGH (ref 70–99)

## 2019-07-04 NOTE — Plan of Care (Signed)
  Problem: Education: Goal: Knowledge of General Education information will improve Description Including pain rating scale, medication(s)/side effects and non-pharmacologic comfort measures Outcome: Progressing   

## 2019-07-04 NOTE — Evaluation (Signed)
Occupational Therapy Evaluation Patient Details Name: Renee [name] MRN: [id number] DOB: May 08, 1929 Today's Date: 07/04/2019    History of Present Illness 85 yo admitted with sepsis due to PNA and UTI PMH: L side weakness residual from CVA, uses walker, DM2, HTN, CKD IV   Clinical Impression   PT admitted with PNA and UTI. Pt currently with functional limitiations due to the deficits listed below (see OT problem list). Pt currently min (A) and mod cues for safety with RW. Pt pushing the Rw too far ahead and to the RIght due to poor grasp with L UE. Pt noted to have loose grasp with L and slightly supinated on RW. Pt asleep on arrival and agreeable to adl task.  Pt will benefit from skilled OT to increase their independence and safety with adls and balance to allow discharge HHOT ( declined CIR and SNF per SW notes).     Follow Up Recommendations  Home health OT;Supervision - Intermittent    Equipment Recommendations  3 in 1 bedside commode(RW)    Recommendations for Other Services       Precautions / Restrictions Precautions Precautions: Fall Precaution Comments: oxygen required       Mobility Bed Mobility               General bed mobility comments: in chair on arrival  Transfers Overall transfer level: Needs assistance Equipment used: Rolling walker (2 wheeled) Transfers: Sit to/from Stand Sit to Stand: Min assist         General transfer comment: pt is able to power up from chair using bil Ue on chair arm rest    Balance Overall balance assessment: Needs assistance   Sitting balance-Leahy Scale: Fair     Standing balance support: Bilateral upper extremity supported;During functional activity Standing balance-Leahy Scale: Poor Standing balance comment: reliant on UB support and increased base of support                           ADL either performed or assessed with clinical judgement   ADL Overall ADL's : Needs assistance/impaired      Grooming: Wash/dry face;Wash/dry hands;Standing;Minimal assistance Grooming Details (indicate cue type and reason): pt requires mod cues for safety and leaning again sink. pt with widen base of support Upper Body Bathing: Moderate assistance   Lower Body Bathing: Maximal assistance   Upper Body Dressing : Moderate assistance   Lower Body Dressing: Maximal assistance   Toilet Transfer: Minimal assistance;RW           Functional mobility during ADLs: Moderate assistance;Rolling walker General ADL Comments: pt with L foot outside Rw during session multople times without awareness. pt needed cues throughout session to remain in the center of Rw and not pushing it too far in advance. pt with L UE slightly supinated and decrease grasp on RW     Vision         Perception     Praxis      Pertinent Vitals/Pain Pain Assessment: No/denies pain     Hand Dominance Right   Extremity/Trunk Assessment Upper Extremity Assessment Upper Extremity Assessment: Generalized weakness   Lower Extremity Assessment Lower Extremity Assessment: Generalized weakness   Cervical / Trunk Assessment Cervical / Trunk Assessment: Kyphotic   Communication Communication Communication: HOH   Cognition Arousal/Alertness: Awake/alert Behavior During Therapy: WFL for tasks assessed/performed Overall Cognitive Status: Impaired/Different from baseline Area of Impairment: Memory  General Comments: pt with poor recall of plan during session. pt informed of plan able to repeat it back and then when standing states "what ar ewe doing"    General Comments  requesting to remain up in chair for lunch     Exercises     Shoulder Instructions      Home Living Family/patient expects to be discharged to:: Private residence Living Arrangements: Alone Available Help at Discharge: Family;Available PRN/intermittently;Personal care attendant Type of Home: House Home  Access: Ramped entrance     Home Layout: One level     Bathroom Shower/Tub: Occupational psychologist: Standard Bathroom Accessibility: Yes   Home Equipment: None   Additional Comments: lived alone and was able to ambulate with no assist, but had caregiver help for 6 hours a day (all information obtained from PT session)      Prior Functioning/Environment Level of Independence: Independent        Comments: caregiver help from 9-3        OT Problem List: Decreased activity tolerance;Decreased cognition;Decreased coordination;Decreased knowledge of precautions;Decreased safety awareness;Decreased strength;Decreased knowledge of use of DME or AE;Impaired balance (sitting and/or standing);Obesity      OT Treatment/Interventions: Self-care/ADL training;Therapeutic exercise;Neuromuscular education;Energy conservation;DME and/or AE instruction;Manual therapy;Therapeutic activities;Cognitive remediation/compensation;Patient/family education;Balance training    OT Goals(Current goals can be found in the care plan section) Acute Rehab OT Goals Patient Stated Goal: to eat lunch in chair OT Goal Formulation: With patient Time For Goal Achievement: 07/18/19 Potential to Achieve Goals: Good  OT Frequency: Min 2X/week   Barriers to D/C:            Co-evaluation              AM-PAC OT "6 Clicks" Daily Activity     Outcome Measure Help from another person eating meals?: None Help from another person taking care of personal grooming?: A Little Help from another person toileting, which includes using toliet, bedpan, or urinal?: A Little Help from another person bathing (including washing, rinsing, drying)?: A Little Help from another person to put on and taking off regular upper body clothing?: A Little Help from another person to put on and taking off regular lower body clothing?: A Lot 6 Click Score: 18   End of Session Equipment Utilized During Treatment: Gait  belt;Rolling walker Nurse Communication: Mobility status;Precautions  Activity Tolerance: Patient tolerated treatment well Patient left: in chair;with call bell/phone within reach;with chair alarm set  OT Visit Diagnosis: Unsteadiness on feet (R26.81);Muscle weakness (generalized) (M62.81)                Time: 0865-7846 OT Time Calculation (min): 11 min Charges:  OT General Charges $OT Visit: 1 Visit OT Evaluation $OT Eval Moderate Complexity: 1 Mod   Renee [name], OTR/L  Acute Rehabilitation Services Pager: [phone] Office: [phone] .   Renee [name] 07/04/2019, 1:46 PM

## 2019-07-04 NOTE — Plan of Care (Signed)
  Problem: Education: Goal: Knowledge of General Education information will improve Description: Including pain rating scale, medication(s)/side effects and non-pharmacologic comfort measures Outcome: Progressing   Problem: Clinical Measurements: Goal: Respiratory complications will improve Outcome: Progressing   

## 2019-07-04 NOTE — Progress Notes (Signed)
PROGRESS NOTE    Renee [name]  DGU:[id number] DOB: [birth year] DOA: 06/30/2019 PCP: Abner Greenspan, MD   Brief Narrative:  Per HPI: Renee [name] a 85 y.o.femalewithhistory of stroke with left-sided weakness uses walker to ambulate, diabetes mellitus type 2, hypertension, chronic kidney disease stage IV presenting with nausea/vomiting and fever at home after eating supper the night before presentation.  ED evaluation noted leukocytosis of 17,000, blood glucose 307 UA is concerning for UTI. CT abdomen pelvis done shows nothing acute abdomen but does show bilateral pneumonia and COVID-19 test was negative. Given the septic-like picture patient was given fluid bolus and empiric antibiotics for pneumonia and admitted for further management.  11/12: Patient seen and evaluated with no complaints or concerns noted.  Urine culture with gram-negative rods noted.  Will need to follow ID and sensitivity and anticipate discharge in a.m. once these have resulted.  Continue current antibiotics.  11/13: Patient noted to have  with further sensitivities pending.  Will convert IV of azithromycin to oral and maintain on Rocephin until further sensitivities return.  OT evaluation also ordered as requested by PT.  Patient agreeable to discharge to SNF when bed available.  Assessment & Plan:   Principal Problem:   Sepsis ([place]) Active Problems:   Type 2 diabetes, controlled, with retinopathy ([place])   Essential hypertension   Sleep apnea   Community acquired pneumonia   History of CVA (cerebrovascular accident)   CKD (chronic kidney disease) stage 4, GFR 15-29 ml/min ([place])   1. Sepsis - secondary to community-acquired pneumonia and E. coli UTI - Cont Abx. . 2. Diabetes mellitus type 2  -Glycemic control. -SSI.  3. Chronic kidney disease stage IV  -creatinine appears to be at baseline. Note that patient is on ACE inhibitor.  4. Physical Deconditioning Awaiting D/C to CIR pending bed  availability    DVT prophylaxis: Heparin Code Status: Full Family Communication: Discussed with daughter Disposition Plan:  Discharge to CIR once bed available.   Consultants:   None  Procedures:   None  Antimicrobials:  Anti-infectives (From admission, onward)   Start     Dose/Rate Route Frequency Ordered Stop   07/03/19 2200  sulfamethoxazole-trimethoprim (BACTRIM DS) 800-160 MG per tablet 1 tablet     1 tablet Oral Every 12 hours 07/03/19 1811     07/03/19 1000  azithromycin (ZITHROMAX) tablet 500 mg     500 mg Oral Daily 07/03/19 0850 07/07/19 0959   07/02/19 1800  cefTRIAXone (ROCEPHIN) 2 g in sodium chloride 0.9 % 100 mL IVPB  Status:  Discontinued     2 g 200 mL/hr over 30 Minutes Intravenous Every 24 hours 07/01/19 0526 07/03/19 1811   07/02/19 0800  azithromycin (ZITHROMAX) 500 mg in sodium chloride 0.9 % 250 mL IVPB  Status:  Discontinued     500 mg 250 mL/hr over 60 Minutes Intravenous Every 24 hours 07/01/19 0526 07/03/19 0850   07/01/19 0430  cefTRIAXone (ROCEPHIN) 1 g in sodium chloride 0.9 % 100 mL IVPB     1 g 200 mL/hr over 30 Minutes Intravenous  Once 07/01/19 0428 07/01/19 0554   07/01/19 0430  azithromycin (ZITHROMAX) 500 mg in sodium chloride 0.9 % 250 mL IVPB     500 mg 250 mL/hr over 60 Minutes Intravenous  Once 07/01/19 0428 07/01/19 0657      Subjective: Patient seen and evaluated today. No acute concerns or events noted overnight.  Objective: Vitals:   07/03/19 1545 07/03/19 2041 07/04/19 0502 07/04/19  0942  BP: (!) 163/72 (!) 171/67 (!) 147/56 (!) 158/60  Pulse: (!) 59 70 61 74  Resp: 18 19 18 20   Temp: 98.2 F (36.8 C) 97.9 F (36.6 C) 98 F (36.7 C) 98 F (36.7 C)  TempSrc: Oral   Oral  SpO2: 97% 97% 92% 97%    Intake/Output Summary (Last 24 hours) at 07/04/2019 1308 Last data filed at 07/04/2019 0849 Gross per 24 hour  Intake 568 ml  Output 250 ml  Net 318 ml   There were no vitals filed for this visit.   Examination:  General exam: NAD, comfortable  Respiratory system: Clear to auscultation. Respiratory effort normal. Cardiovascular system: S1 & S2 heard, RRR. No JVD, murmurs, rubs, gallops or clicks. No pedal edema. Gastrointestinal system: Abdomen is nondistended, soft and nontender. No organomegaly or masses felt. Normal bowel sounds heard. Central nervous system: Alert and oriented. No focal neurological deficits. Extremities: Symmetric 5 x 5 power. Skin: No rashes, lesions or ulcers Psychiatry: Judgement and insight appear normal. Mood & affect appropriate.     Data Reviewed: I have personally reviewed following labs and imaging studies  CBC: Recent Labs  Lab 06/30/19 2118 07/01/19 0821 07/02/19 0450 07/03/19 0535 07/04/19 0356  WBC 17.6* 11.2* 7.0 6.5 5.2  NEUTROABS 16.3*  --   --   --   --   HGB 12.3 9.9* 10.4* 10.6* 10.5*  HCT 39.8 32.3* 33.9* 33.7* 33.4*  MCV 91.5 92.3 93.4 90.6 90.8  PLT 197 151 163 169 025   Basic Metabolic Panel: Recent Labs  Lab 06/30/19 2118 07/01/19 0821 07/02/19 0450 07/03/19 0535 07/04/19 0356  NA 138  --  141 142 141  K 4.2  --  3.9 3.9 4.1  CL 104  --  109 108 105  CO2 22  --  23 25 25   GLUCOSE 307*  --  142* 155* 158*  BUN 28*  --  19 16 19   CREATININE 1.59* 1.25* 1.31* 1.26* 1.30*  CALCIUM 9.0  --  8.4* 8.5* 8.6*   GFR: CrCl cannot be calculated (Unknown ideal weight.). Liver Function Tests: Recent Labs  Lab 06/30/19 2118  AST 21  ALT 13  ALKPHOS 90  BILITOT 0.5  PROT 6.0*  ALBUMIN 3.7   Recent Labs  Lab 06/30/19 2118  LIPASE 20   No results for input(s): AMMONIA in the last 168 hours. Coagulation Profile: Recent Labs  Lab 06/30/19 2118  INR 1.0   Cardiac Enzymes: No results for input(s): CKTOTAL, CKMB, CKMBINDEX, TROPONINI in the last 168 hours. BNP (last 3 results) No results for input(s): PROBNP in the last 8760 hours. HbA1C: No results for input(s): HGBA1C in the last 72 hours. CBG: Recent Labs   Lab 07/03/19 1144 07/03/19 1624 07/03/19 2041 07/04/19 0652 07/04/19 1114  GLUCAP 176* 125* 191* 148* 237*   Lipid Profile: No results for input(s): CHOL, HDL, LDLCALC, TRIG, CHOLHDL, LDLDIRECT in the last 72 hours. Thyroid Function Tests: No results for input(s): TSH, T4TOTAL, FREET4, T3FREE, THYROIDAB in the last 72 hours. Anemia Panel: No results for input(s): VITAMINB12, FOLATE, FERRITIN, TIBC, IRON, RETICCTPCT in the last 72 hours. Sepsis Labs: Recent Labs  Lab 07/01/19 0054 07/01/19 0435 07/01/19 0821  LATICACIDVEN 2.0* 1.6 1.1    Recent Results (from the past 240 hour(s))  SARS CORONAVIRUS 2 (TAT 6-24 HRS) Nasopharyngeal Nasopharyngeal Swab     Status: None   Collection Time: 06/30/19  9:12 PM   Specimen: Nasopharyngeal Swab  Result Value Ref Range  Status   SARS Coronavirus 2 NEGATIVE NEGATIVE Final    Comment: (NOTE) SARS-CoV-2 target nucleic acids are NOT DETECTED. The SARS-CoV-2 RNA is generally detectable in upper and lower respiratory specimens during the acute phase of infection. Negative results do not preclude SARS-CoV-2 infection, do not rule out co-infections with other pathogens, and should not be used as the sole basis for treatment or other patient management decisions. Negative results must be combined with clinical observations, patient history, and epidemiological information. The expected result is Negative. Fact Sheet for Patients: SugarRoll.be Fact Sheet for Healthcare Providers: https://www.woods-mathews.com/ This test is not yet approved or cleared by the [place] FDA and  has been authorized for detection and/or diagnosis of SARS-CoV-2 by FDA under an Emergency Use Authorization (EUA). This EUA will remain  in effect (meaning this test can be used) for the duration of the COVID-19 declaration under Section 56 4(b)(1) of the Act, 21 U.S.C. section 360bbb-3(b)(1), unless the authorization is  terminated or revoked sooner. Performed at [facility], [place] [address], [place], [place] [address]   Culture, blood (routine x 2)     Status: None (Preliminary result)   Collection Time: 07/01/19 12:54 AM   Specimen: BLOOD RIGHT HAND  Result Value Ref Range Status   Specimen Description BLOOD RIGHT HAND  Final   Special Requests   Final    BOTTLES DRAWN AEROBIC AND ANAEROBIC Blood Culture results may not be optimal due to an inadequate volume of blood received in culture bottles   Culture   Final    NO GROWTH 2 DAYS Performed at [facility], [place] [address] [place], [place] [address]    Report Status PENDING  Incomplete  Urine culture     Status: Abnormal   Collection Time: 07/01/19  2:45 AM   Specimen: Urine, Random  Result Value Ref Range Status   Specimen Description URINE, RANDOM  Final   Special Requests   Final    NONE Performed at [facility], [place] [address] [place], [place] [address]    Culture (A)  Final    >=100,000 COLONIES/mL ESCHERICHIA COLI >=100,000 COLONIES/mL ENTEROBACTER AEROGENES    Report Status 07/03/2019 FINAL  Final   Organism ID, Bacteria ESCHERICHIA COLI (A)  Final   Organism ID, Bacteria ENTEROBACTER AEROGENES (A)  Final      Susceptibility   Enterobacter aerogenes - MIC*    CEFAZOLIN >=64 RESISTANT Resistant     CEFTRIAXONE 16 INTERMEDIATE Intermediate     CIPROFLOXACIN <=0.25 SENSITIVE Sensitive     GENTAMICIN <=1 SENSITIVE Sensitive     IMIPENEM <=0.25 SENSITIVE Sensitive     NITROFURANTOIN 64 INTERMEDIATE Intermediate     TRIMETH/SULFA <=20 SENSITIVE Sensitive     PIP/TAZO >=128 RESISTANT Resistant     * >=100,000 COLONIES/mL ENTEROBACTER AEROGENES   Escherichia coli - MIC*    AMPICILLIN >=32 RESISTANT Resistant     CEFAZOLIN 16 SENSITIVE Sensitive     CEFTRIAXONE <=1 SENSITIVE Sensitive     CIPROFLOXACIN <=0.25 SENSITIVE Sensitive     GENTAMICIN <=1 SENSITIVE Sensitive     IMIPENEM <=0.25 SENSITIVE Sensitive      NITROFURANTOIN 256 RESISTANT Resistant     TRIMETH/SULFA <=20 SENSITIVE Sensitive     AMPICILLIN/SULBACTAM >=32 RESISTANT Resistant     PIP/TAZO 8 SENSITIVE Sensitive     Extended ESBL NEGATIVE Sensitive     * >=100,000 COLONIES/mL ESCHERICHIA COLI  Culture, blood (routine x 2)     Status: None (  Preliminary result)   Collection Time: 07/01/19  4:35 AM   Specimen: BLOOD LEFT HAND  Result Value Ref Range Status   Specimen Description BLOOD LEFT HAND  Final   Special Requests   Final    BOTTLES DRAWN AEROBIC AND ANAEROBIC Blood Culture adequate volume   Culture   Final    NO GROWTH 2 DAYS Performed at [facility], [address], [place], [place] [address]    Report Status PENDING  Incomplete         Radiology Studies: No results found.      Scheduled Meds: . amLODipine  10 mg Oral Daily  . aspirin  325 mg Oral Daily  . azithromycin  500 mg Oral Daily  . docusate sodium  200 mg Oral QHS  . ferrous sulfate  325 mg Oral BID WC  . gemfibrozil  600 mg Oral QAC breakfast  . heparin  5,000 Units Subcutaneous Q8H  . hydrALAZINE  100 mg Oral Q8H  . insulin aspart  0-9 Units Subcutaneous TID WC  . metoprolol succinate  12.5 mg Oral BID  . mirtazapine  7.5 mg Oral QHS  . sertraline  50 mg Oral Daily  . sulfamethoxazole-trimethoprim  1 tablet Oral Q12H  . trandolapril  4 mg Oral Daily  . vitamin B-12  1,000 mcg Oral Daily   Continuous Infusions:    LOS: 3 days    Time spent: 30 minutes    Benito Mccreedy, MD [facility] Pager [phone]  If 7PM-7AM, please contact night-coverage [email or web address] Password [email or web address] 07/04/2019, 1:08 PM

## 2019-07-05 LAB — GLUCOSE, CAPILLARY
Glucose-Capillary: 167 mg/dL — ABNORMAL HIGH (ref 70–99)
Glucose-Capillary: 161 mg/dL — ABNORMAL HIGH (ref 70–99)
Glucose-Capillary: 95 mg/dL (ref 70–99)

## 2019-07-05 MED ORDER — GUAIFENESIN ER 600 MG PO TB12
600.0000 mg | ORAL_TABLET | Freq: Two times a day (BID) | ORAL | Status: DC
  Administered 2019-07-05 – 2019-07-08 (×7): 600 mg via ORAL
  Filled 2019-07-05 (×7): qty 1

## 2019-07-05 NOTE — Progress Notes (Signed)
OT Cancellation Note  Patient Details Name: Renee [name] MRN: [id number] DOB: 05-29-1929   Cancelled Treatment:    Reason Eval/Treat Not Completed: Fatigue/lethargy limiting ability to participate;Other (comment)(Pt received in bed after participating with RN, as reported from RN and sister in law at bed side. OT will follow up at another time to address safety and function with ADLs.)  Minus Breeding, MSOT, OTR/L  [facility]  [phone]   Marius Ditch 07/05/2019, 3:05 PM

## 2019-07-05 NOTE — Progress Notes (Signed)
PROGRESS NOTE    Renee [name]  KGY:[id number] DOB: 20-May-1929 DOA: 06/30/2019 PCP: Abner Greenspan, MD   Brief Narrative:  Per HPI: Renee [name] a 85 y.o.femalewithhistory of stroke with left-sided weakness uses walker to ambulate, diabetes mellitus type 2, hypertension, chronic kidney disease stage IV presenting with nausea/vomiting and fever at home after eating supper the night before presentation.  ED evaluation noted leukocytosis of 17,000, blood glucose 307 UA is concerning for UTI. CT abdomen pelvis done shows nothing acute abdomen but does show bilateral pneumonia and COVID-19 test was negative. Given the septic-like picture patient was given fluid bolus and empiric antibiotics for pneumonia and admitted for further management.   Assessment & Plan:   Principal Problem:   Sepsis ([place]) Active Problems:   Type 2 diabetes, controlled, with retinopathy ([place])   Essential hypertension   Sleep apnea   Community acquired pneumonia   History of CVA (cerebrovascular accident)   CKD (chronic kidney disease) stage 4, GFR 15-29 ml/min ([place])   1. Sepsis - secondary to community-acquired pneumonia and E. coli UTI - Cont Abx. . 2. Diabetes mellitus type 2  -Glycemic control. -SSI.  3. Chronic kidney disease stage IV  -creatinine appears to be at baseline. Note that patient is on ACE inhibitor.  4. Physical Deconditioning Awaiting D/C to CIR pending bed availability    DVT prophylaxis: Heparin Code Status: Full Family Communication: Discussed with daughter-in-law Disposition Plan:  Discharge to CIR once bed available.   Consultants:   None  Procedures:   None  Antimicrobials:  Anti-infectives (From admission, onward)   Start     Dose/Rate Route Frequency Ordered Stop   07/03/19 2200  sulfamethoxazole-trimethoprim (BACTRIM DS) 800-160 MG per tablet 1 tablet     1 tablet Oral Every 12 hours 07/03/19 1811     07/03/19 1000  azithromycin (ZITHROMAX)  tablet 500 mg     500 mg Oral Daily 07/03/19 0850 07/07/19 0959   07/02/19 1800  cefTRIAXone (ROCEPHIN) 2 g in sodium chloride 0.9 % 100 mL IVPB  Status:  Discontinued     2 g 200 mL/hr over 30 Minutes Intravenous Every 24 hours 07/01/19 0526 07/03/19 1811   07/02/19 0800  azithromycin (ZITHROMAX) 500 mg in sodium chloride 0.9 % 250 mL IVPB  Status:  Discontinued     500 mg 250 mL/hr over 60 Minutes Intravenous Every 24 hours 07/01/19 0526 07/03/19 0850   07/01/19 0430  cefTRIAXone (ROCEPHIN) 1 g in sodium chloride 0.9 % 100 mL IVPB     1 g 200 mL/hr over 30 Minutes Intravenous  Once 07/01/19 0428 07/01/19 0554   07/01/19 0430  azithromycin (ZITHROMAX) 500 mg in sodium chloride 0.9 % 250 mL IVPB     500 mg 250 mL/hr over 60 Minutes Intravenous  Once 07/01/19 0428 07/01/19 0657      Subjective: Patient seen and evaluated today. No acute concerns or events noted overnight.. Walking with walking assisted by nursing.  Objective: Vitals:   07/04/19 2302 07/05/19 0634 07/05/19 0852 07/05/19 1008  BP: (!) 150/68 (!) 160/69 (!) 174/62   Pulse: 76 67 70   Resp: (!) 21 20 18    Temp: 99.3 F (37.4 C) 98.3 F (36.8 C) 98.5 F (36.9 C)   TempSrc: Oral Oral Oral   SpO2: 93% 93% 91% 93%    Intake/Output Summary (Last 24 hours) at 07/05/2019 1425 Last data filed at 07/05/2019 1342 Gross per 24 hour  Intake 820 ml  Output 2700 ml  Net -1880 ml   There were no vitals filed for this visit.  Examination:  General exam: NAD, comfortable  Respiratory system: Clear to auscultation. Respiratory effort normal. Cardiovascular system: S1 & S2 heard, RRR. No JVD, murmurs, rubs, gallops or clicks. No pedal edema. Gastrointestinal system: Abdomen is nondistended, soft and nontender. No organomegaly or masses felt. Normal bowel sounds heard. Central nervous system: Alert and oriented. No focal neurological deficits. Extremities: Symmetric 5 x 5 power. Skin: No rashes, lesions or ulcers  Psychiatry: Judgement and insight appear normal. Mood & affect appropriate.     Data Reviewed: I have personally reviewed following labs and imaging studies  CBC: Recent Labs  Lab 06/30/19 2118 07/01/19 0821 07/02/19 0450 07/03/19 0535 07/04/19 0356  WBC 17.6* 11.2* 7.0 6.5 5.2  NEUTROABS 16.3*  --   --   --   --   HGB 12.3 9.9* 10.4* 10.6* 10.5*  HCT 39.8 32.3* 33.9* 33.7* 33.4*  MCV 91.5 92.3 93.4 90.6 90.8  PLT 197 151 163 169 244   Basic Metabolic Panel: Recent Labs  Lab 06/30/19 2118 07/01/19 0821 07/02/19 0450 07/03/19 0535 07/04/19 0356  NA 138  --  141 142 141  K 4.2  --  3.9 3.9 4.1  CL 104  --  109 108 105  CO2 22  --  23 25 25   GLUCOSE 307*  --  142* 155* 158*  BUN 28*  --  19 16 19   CREATININE 1.59* 1.25* 1.31* 1.26* 1.30*  CALCIUM 9.0  --  8.4* 8.5* 8.6*   GFR: CrCl cannot be calculated (Unknown ideal weight.). Liver Function Tests: Recent Labs  Lab 06/30/19 2118  AST 21  ALT 13  ALKPHOS 90  BILITOT 0.5  PROT 6.0*  ALBUMIN 3.7   Recent Labs  Lab 06/30/19 2118  LIPASE 20   No results for input(s): AMMONIA in the last 168 hours. Coagulation Profile: Recent Labs  Lab 06/30/19 2118  INR 1.0   Cardiac Enzymes: No results for input(s): CKTOTAL, CKMB, CKMBINDEX, TROPONINI in the last 168 hours. BNP (last 3 results) No results for input(s): PROBNP in the last 8760 hours. HbA1C: No results for input(s): HGBA1C in the last 72 hours. CBG: Recent Labs  Lab 07/04/19 1114 07/04/19 1622 07/04/19 2252 07/05/19 0711 07/05/19 1147  GLUCAP 237* 146* 139* 167* 161*   Lipid Profile: No results for input(s): CHOL, HDL, LDLCALC, TRIG, CHOLHDL, LDLDIRECT in the last 72 hours. Thyroid Function Tests: No results for input(s): TSH, T4TOTAL, FREET4, T3FREE, THYROIDAB in the last 72 hours. Anemia Panel: No results for input(s): VITAMINB12, FOLATE, FERRITIN, TIBC, IRON, RETICCTPCT in the last 72 hours. Sepsis Labs: Recent Labs  Lab 07/01/19 0054  07/01/19 0435 07/01/19 0821  LATICACIDVEN 2.0* 1.6 1.1    Recent Results (from the past 240 hour(s))  SARS CORONAVIRUS 2 (TAT 6-24 HRS) Nasopharyngeal Nasopharyngeal Swab     Status: None   Collection Time: 06/30/19  9:12 PM   Specimen: Nasopharyngeal Swab  Result Value Ref Range Status   SARS Coronavirus 2 NEGATIVE NEGATIVE Final    Comment: (NOTE) SARS-CoV-2 target nucleic acids are NOT DETECTED. The SARS-CoV-2 RNA is generally detectable in upper and lower respiratory specimens during the acute phase of infection. Negative results do not preclude SARS-CoV-2 infection, do not rule out co-infections with other pathogens, and should not be used as the sole basis for treatment or other patient management decisions. Negative results must be combined with clinical observations, patient history, and epidemiological information. The  expected result is Negative. Fact Sheet for Patients: SugarRoll.be Fact Sheet for Healthcare Providers: https://www.woods-mathews.com/ This test is not yet approved or cleared by the [place] FDA and  has been authorized for detection and/or diagnosis of SARS-CoV-2 by FDA under an Emergency Use Authorization (EUA). This EUA will remain  in effect (meaning this test can be used) for the duration of the COVID-19 declaration under Section 56 4(b)(1) of the Act, 21 U.S.C. section 360bbb-3(b)(1), unless the authorization is terminated or revoked sooner. Performed at [facility], [place] [address] [place], [place] [address]   Culture, blood (routine x 2)     Status: None (Preliminary result)   Collection Time: 07/01/19 12:54 AM   Specimen: BLOOD RIGHT HAND  Result Value Ref Range Status   Specimen Description BLOOD RIGHT HAND  Final   Special Requests   Final    BOTTLES DRAWN AEROBIC AND ANAEROBIC Blood Culture results may not be optimal due to an inadequate volume of blood received in culture bottles    Culture   Final    NO GROWTH 4 DAYS Performed at [facility], [place] [address] [place], [place] [address]    Report Status PENDING  Incomplete  Urine culture     Status: Abnormal   Collection Time: 07/01/19  2:45 AM   Specimen: Urine, Random  Result Value Ref Range Status   Specimen Description URINE, RANDOM  Final   Special Requests   Final    NONE Performed at [facility], [place] [address] [place], [place] [address]    Culture (A)  Final    >=100,000 COLONIES/mL ESCHERICHIA COLI >=100,000 COLONIES/mL ENTEROBACTER AEROGENES    Report Status 07/03/2019 FINAL  Final   Organism ID, Bacteria ESCHERICHIA COLI (A)  Final   Organism ID, Bacteria ENTEROBACTER AEROGENES (A)  Final      Susceptibility   Enterobacter aerogenes - MIC*    CEFAZOLIN >=64 RESISTANT Resistant     CEFTRIAXONE 16 INTERMEDIATE Intermediate     CIPROFLOXACIN <=0.25 SENSITIVE Sensitive     GENTAMICIN <=1 SENSITIVE Sensitive     IMIPENEM <=0.25 SENSITIVE Sensitive     NITROFURANTOIN 64 INTERMEDIATE Intermediate     TRIMETH/SULFA <=20 SENSITIVE Sensitive     PIP/TAZO >=128 RESISTANT Resistant     * >=100,000 COLONIES/mL ENTEROBACTER AEROGENES   Escherichia coli - MIC*    AMPICILLIN >=32 RESISTANT Resistant     CEFAZOLIN 16 SENSITIVE Sensitive     CEFTRIAXONE <=1 SENSITIVE Sensitive     CIPROFLOXACIN <=0.25 SENSITIVE Sensitive     GENTAMICIN <=1 SENSITIVE Sensitive     IMIPENEM <=0.25 SENSITIVE Sensitive     NITROFURANTOIN 256 RESISTANT Resistant     TRIMETH/SULFA <=20 SENSITIVE Sensitive     AMPICILLIN/SULBACTAM >=32 RESISTANT Resistant     PIP/TAZO 8 SENSITIVE Sensitive     Extended ESBL NEGATIVE Sensitive     * >=100,000 COLONIES/mL ESCHERICHIA COLI  Culture, blood (routine x 2)     Status: None (Preliminary result)   Collection Time: 07/01/19  4:35 AM   Specimen: BLOOD LEFT HAND  Result Value Ref Range Status   Specimen Description BLOOD LEFT HAND  Final   Special Requests   Final     BOTTLES DRAWN AEROBIC AND ANAEROBIC Blood Culture adequate volume   Culture   Final    NO GROWTH 4 DAYS Performed at [facility], [address] [place], [place] [address]    Report Status PENDING  Incomplete  Radiology Studies: No results found.      Scheduled Meds: . amLODipine  10 mg Oral Daily  . aspirin  325 mg Oral Daily  . azithromycin  500 mg Oral Daily  . docusate sodium  200 mg Oral QHS  . ferrous sulfate  325 mg Oral BID WC  . gemfibrozil  600 mg Oral QAC breakfast  . guaiFENesin  600 mg Oral BID  . heparin  5,000 Units Subcutaneous Q8H  . hydrALAZINE  100 mg Oral Q8H  . insulin aspart  0-9 Units Subcutaneous TID WC  . metoprolol succinate  12.5 mg Oral BID  . mirtazapine  7.5 mg Oral QHS  . sertraline  50 mg Oral Daily  . sulfamethoxazole-trimethoprim  1 tablet Oral Q12H  . trandolapril  4 mg Oral Daily  . vitamin B-12  1,000 mcg Oral Daily   Continuous Infusions:    LOS: 4 days    Time spent: 25 minutes    Benito Mccreedy, MD [facility] Pager [phone]  If 7PM-7AM, please contact night-coverage [email or web address] Password [email or web address] 07/05/2019, 2:25 PM

## 2019-07-06 ENCOUNTER — Other Ambulatory Visit: Payer: Self-pay

## 2019-07-06 LAB — GLUCOSE, CAPILLARY
Glucose-Capillary: 146 mg/dL — ABNORMAL HIGH (ref 70–99)
Glucose-Capillary: 199 mg/dL — ABNORMAL HIGH (ref 70–99)
Glucose-Capillary: 90 mg/dL (ref 70–99)
Glucose-Capillary: 185 mg/dL — ABNORMAL HIGH (ref 70–99)

## 2019-07-06 LAB — CULTURE, BLOOD (ROUTINE X 2)
Culture: NO GROWTH
Culture: NO GROWTH
Special Requests: ADEQUATE

## 2019-07-06 NOTE — Progress Notes (Signed)
PROGRESS NOTE    Renee [name]  NKN:[id number] DOB: 10/21/1928 DOA: 06/30/2019 PCP: Abner Greenspan, MD   Brief Narrative:  Per HPI:  Renee Lienhard Busickis a 85 y.o.femalewithhistory of stroke with left-sided weakness uses walker to ambulate, diabetes mellitus type 2, hypertension, chronic kidney disease stage IV presenting with nausea/vomiting and fever at home after eating supper the night before presentation.  ED evaluation noted leukocytosis of 17,000, blood glucose 307 UA is concerning for UTI. CT abdomen pelvis done shows nothing acute abdomen but does show bilateral pneumonia and COVID-19 test was negative. Given the septic-like picture patient was given fluid bolus and empiric antibiotics for pneumonia and admitted for further management.   Assessment & Plan:   Principal Problem:   Sepsis ([place]) Active Problems:   Type 2 diabetes, controlled, with retinopathy ([place])   Essential hypertension   Sleep apnea   Community acquired pneumonia   History of CVA (cerebrovascular accident)   CKD (chronic kidney disease) stage 4, GFR 15-29 ml/min ([place])   1. Sepsis secondary to community-acquired ammonia and E. coli UTI: Patient is doing much better.  Sepsis parameters have resolved.  She is on room air and comfortable.  She is growing Enterobacter and E. coli in the urine which is sensitive to Bactrim so she was switched to Bactrim on 07/03/2019.  We will continue this. . 2. Diabetes mellitus type 2: Blood sugar fairly controlled. Continue SSI.  3. Chronic kidney disease stage III: At baseline.  Continue to watch. -creatinine appears to be at baseline. Note that patient is on ACE inhibitor.  4. Physical Deconditioning Awaiting D/C to CIR pending bed availability   DVT prophylaxis: Heparin Code Status: Full Family Communication: Discussed with daughter-in-law Disposition Plan:  Discharge to CIR once bed available.   Consultants:   None  Procedures:   None   Antimicrobials:  Anti-infectives (From admission, onward)   Start     Dose/Rate Route Frequency Ordered Stop   07/03/19 2200  sulfamethoxazole-trimethoprim (BACTRIM DS) 800-160 MG per tablet 1 tablet     1 tablet Oral Every 12 hours 07/03/19 1811     07/03/19 1000  azithromycin (ZITHROMAX) tablet 500 mg     500 mg Oral Daily 07/03/19 0850 07/06/19 0842   07/02/19 1800  cefTRIAXone (ROCEPHIN) 2 g in sodium chloride 0.9 % 100 mL IVPB  Status:  Discontinued     2 g 200 mL/hr over 30 Minutes Intravenous Every 24 hours 07/01/19 0526 07/03/19 1811   07/02/19 0800  azithromycin (ZITHROMAX) 500 mg in sodium chloride 0.9 % 250 mL IVPB  Status:  Discontinued     500 mg 250 mL/hr over 60 Minutes Intravenous Every 24 hours 07/01/19 0526 07/03/19 0850   07/01/19 0430  cefTRIAXone (ROCEPHIN) 1 g in sodium chloride 0.9 % 100 mL IVPB     1 g 200 mL/hr over 30 Minutes Intravenous  Once 07/01/19 0428 07/01/19 0554   07/01/19 0430  azithromycin (ZITHROMAX) 500 mg in sodium chloride 0.9 % 250 mL IVPB     500 mg 250 mL/hr over 60 Minutes Intravenous  Once 07/01/19 0428 07/01/19 0657      Subjective: Patient seen and examined.  She has no complaints.  No shortness of breath, no fever. Objective: Vitals:   07/05/19 1703 07/05/19 2143 07/06/19 0601 07/06/19 0852  BP: (!) 142/71 (!) 156/68 (!) 147/65 (!) 156/56  Pulse: 69 68 60 76  Resp: 18 20 18 18   Temp: 98.3 F (36.8 C) 98.8 F (37.1  C) 98.8 F (37.1 C) 98 F (36.7 C)  TempSrc: Oral Oral Oral Oral  SpO2: 92% 94% 95% 93%    Intake/Output Summary (Last 24 hours) at 07/06/2019 1309 Last data filed at 07/06/2019 1000 Gross per 24 hour  Intake 920 ml  Output 800 ml  Net 120 ml   There were no vitals filed for this visit.  Examination:  General exam: Appears calm and comfortable  Respiratory system: Clear to auscultation. Respiratory effort normal. Cardiovascular system: S1 & S2 heard, RRR. No JVD, murmurs, rubs, gallops or clicks. No pedal  edema. Gastrointestinal system: Abdomen is nondistended, soft and nontender. No organomegaly or masses felt. Normal bowel sounds heard. Central nervous system: Alert and oriented. No focal neurological deficits. Extremities: Symmetric 5 x 5 power. Skin: No rashes, lesions or ulcers.  Psychiatry: Judgement and insight appear normal. Mood & affect appropriate.   Data Reviewed: I have personally reviewed following labs and imaging studies  CBC: Recent Labs  Lab 06/30/19 2118 07/01/19 0821 07/02/19 0450 07/03/19 0535 07/04/19 0356  WBC 17.6* 11.2* 7.0 6.5 5.2  NEUTROABS 16.3*  --   --   --   --   HGB 12.3 9.9* 10.4* 10.6* 10.5*  HCT 39.8 32.3* 33.9* 33.7* 33.4*  MCV 91.5 92.3 93.4 90.6 90.8  PLT 197 151 163 169 502   Basic Metabolic Panel: Recent Labs  Lab 06/30/19 2118 07/01/19 0821 07/02/19 0450 07/03/19 0535 07/04/19 0356  NA 138  --  141 142 141  K 4.2  --  3.9 3.9 4.1  CL 104  --  109 108 105  CO2 22  --  23 25 25   GLUCOSE 307*  --  142* 155* 158*  BUN 28*  --  19 16 19   CREATININE 1.59* 1.25* 1.31* 1.26* 1.30*  CALCIUM 9.0  --  8.4* 8.5* 8.6*   GFR: CrCl cannot be calculated (Unknown ideal weight.). Liver Function Tests: Recent Labs  Lab 06/30/19 2118  AST 21  ALT 13  ALKPHOS 90  BILITOT 0.5  PROT 6.0*  ALBUMIN 3.7   Recent Labs  Lab 06/30/19 2118  LIPASE 20   No results for input(s): AMMONIA in the last 168 hours. Coagulation Profile: Recent Labs  Lab 06/30/19 2118  INR 1.0   Cardiac Enzymes: No results for input(s): CKTOTAL, CKMB, CKMBINDEX, TROPONINI in the last 168 hours. BNP (last 3 results) No results for input(s): PROBNP in the last 8760 hours. HbA1C: No results for input(s): HGBA1C in the last 72 hours. CBG: Recent Labs  Lab 07/04/19 2252 07/05/19 0711 07/05/19 1147 07/05/19 1607 07/06/19 0718  GLUCAP 139* 167* 161* 95 146*   Lipid Profile: No results for input(s): CHOL, HDL, LDLCALC, TRIG, CHOLHDL, LDLDIRECT in the last 72  hours. Thyroid Function Tests: No results for input(s): TSH, T4TOTAL, FREET4, T3FREE, THYROIDAB in the last 72 hours. Anemia Panel: No results for input(s): VITAMINB12, FOLATE, FERRITIN, TIBC, IRON, RETICCTPCT in the last 72 hours. Sepsis Labs: Recent Labs  Lab 07/01/19 0054 07/01/19 0435 07/01/19 0821  LATICACIDVEN 2.0* 1.6 1.1    Recent Results (from the past 240 hour(s))  SARS CORONAVIRUS 2 (TAT 6-24 HRS) Nasopharyngeal Nasopharyngeal Swab     Status: None   Collection Time: 06/30/19  9:12 PM   Specimen: Nasopharyngeal Swab  Result Value Ref Range Status   SARS Coronavirus 2 NEGATIVE NEGATIVE Final    Comment: (NOTE) SARS-CoV-2 target nucleic acids are NOT DETECTED. The SARS-CoV-2 RNA is generally detectable in upper and lower respiratory  specimens during the acute phase of infection. Negative results do not preclude SARS-CoV-2 infection, do not rule out co-infections with other pathogens, and should not be used as the sole basis for treatment or other patient management decisions. Negative results must be combined with clinical observations, patient history, and epidemiological information. The expected result is Negative. Fact Sheet for Patients: SugarRoll.be Fact Sheet for Healthcare Providers: https://www.woods-mathews.com/ This test is not yet approved or cleared by the [place] FDA and  has been authorized for detection and/or diagnosis of SARS-CoV-2 by FDA under an Emergency Use Authorization (EUA). This EUA will remain  in effect (meaning this test can be used) for the duration of the COVID-19 declaration under Section 56 4(b)(1) of the Act, 21 U.S.C. section 360bbb-3(b)(1), unless the authorization is terminated or revoked sooner. Performed at [facility], [place] [address] [place] [address] [place], [place] [address]   Culture, blood (routine x 2)     Status: None   Collection Time: 07/01/19 12:54 AM   Specimen: BLOOD RIGHT  HAND  Result Value Ref Range Status   Specimen Description BLOOD RIGHT HAND  Final   Special Requests   Final    BOTTLES DRAWN AEROBIC AND ANAEROBIC Blood Culture results may not be optimal due to an inadequate volume of blood received in culture bottles   Culture   Final    NO GROWTH 5 DAYS Performed at [facility], [place] [address] [place],  [address]    Report Status 07/06/2019 FINAL  Final  Urine culture     Status: Abnormal   Collection Time: 07/01/19  2:45 AM   Specimen: Urine, Random  Result Value Ref Range Status   Specimen Description URINE, RANDOM  Final   Special Requests   Final    NONE Performed at [facility], [place] [address] [place], [place] [address]    Culture (A)  Final    >=100,000 COLONIES/mL ESCHERICHIA COLI >=100,000 COLONIES/mL ENTEROBACTER AEROGENES    Report Status 07/03/2019 FINAL  Final   Organism ID, Bacteria ESCHERICHIA COLI (A)  Final   Organism ID, Bacteria ENTEROBACTER AEROGENES (A)  Final      Susceptibility   Enterobacter aerogenes - MIC*    CEFAZOLIN >=64 RESISTANT Resistant     CEFTRIAXONE 16 INTERMEDIATE Intermediate     CIPROFLOXACIN <=0.25 SENSITIVE Sensitive     GENTAMICIN <=1 SENSITIVE Sensitive     IMIPENEM <=0.25 SENSITIVE Sensitive     NITROFURANTOIN 64 INTERMEDIATE Intermediate     TRIMETH/SULFA <=20 SENSITIVE Sensitive     PIP/TAZO >=128 RESISTANT Resistant     * >=100,000 COLONIES/mL ENTEROBACTER AEROGENES   Escherichia coli - MIC*    AMPICILLIN >=32 RESISTANT Resistant     CEFAZOLIN 16 SENSITIVE Sensitive     CEFTRIAXONE <=1 SENSITIVE Sensitive     CIPROFLOXACIN <=0.25 SENSITIVE Sensitive     GENTAMICIN <=1 SENSITIVE Sensitive     IMIPENEM <=0.25 SENSITIVE Sensitive     NITROFURANTOIN 256 RESISTANT Resistant     TRIMETH/SULFA <=20 SENSITIVE Sensitive     AMPICILLIN/SULBACTAM >=32 RESISTANT Resistant     PIP/TAZO 8 SENSITIVE Sensitive     Extended ESBL NEGATIVE Sensitive     * >=100,000 COLONIES/mL  ESCHERICHIA COLI  Culture, blood (routine x 2)     Status: None   Collection Time: 07/01/19  4:35 AM   Specimen: BLOOD LEFT HAND  Result Value Ref Range Status   Specimen Description BLOOD LEFT HAND  Final   Special Requests  Final    BOTTLES DRAWN AEROBIC AND ANAEROBIC Blood Culture adequate volume   Culture   Final    NO GROWTH 5 DAYS Performed at [facility], [place] [address] [place], [place] [address]    Report Status 07/06/2019 FINAL  Final    Radiology Studies: No results found.  Scheduled Meds: . amLODipine  10 mg Oral Daily  . aspirin  325 mg Oral Daily  . docusate sodium  200 mg Oral QHS  . ferrous sulfate  325 mg Oral BID WC  . gemfibrozil  600 mg Oral QAC breakfast  . guaiFENesin  600 mg Oral BID  . heparin  5,000 Units Subcutaneous Q8H  . hydrALAZINE  100 mg Oral Q8H  . insulin aspart  0-9 Units Subcutaneous TID WC  . metoprolol succinate  12.5 mg Oral BID  . mirtazapine  7.5 mg Oral QHS  . sertraline  50 mg Oral Daily  . sulfamethoxazole-trimethoprim  1 tablet Oral Q12H  . trandolapril  4 mg Oral Daily  . vitamin B-12  1,000 mcg Oral Daily   Continuous Infusions:    LOS: 5 days   Time spent: 25 minutes  Darliss Cheney, MD [facility]  If 7PM-7AM, please contact night-coverage [email or web address] Password [email or web address] 07/06/2019, 1:09 PM

## 2019-07-06 NOTE — Progress Notes (Signed)
Occupational Therapy Treatment Patient Details Name: Renee [name] MRN: [id number] DOB: Feb 08, 1929 Today's Date: 07/06/2019    History of present illness 85 yo admitted with sepsis due to PNA and UIT PMH: L side weakness residual from CVA, uses walker, DM2, HTN, CKD IV   OT comments  Patient semi-supine in bed upon arrival. Min cues for sequencing bed mobility min guard assist. Min A for functional transfers and ambulation using rolling walker, mod cues for safety with walker as patient pushes it too far forward with lower extremities outside frame of walker. Patient requires increase time for all functional tasks. Patient denies feeling short of breath despite labored breathing sounds. Patient reports she would like to go to rehab in the hospital to regain strength.   Follow Up Recommendations  CIR    Equipment Recommendations  Other (comment)(to be determined at future venue)       Precautions / Restrictions Precautions Precautions: Fall Restrictions Weight Bearing Restrictions: No       Mobility Bed Mobility Overal bed mobility: Needs Assistance Bed Mobility: Supine to Sit     Supine to sit: Min guard        Transfers Overall transfer level: Needs assistance Equipment used: Rolling walker (2 wheeled) Transfers: Sit to/from Stand Sit to Stand: Min assist              Balance Overall balance assessment: Needs assistance Sitting-balance support: Feet supported Sitting balance-Leahy Scale: Fair     Standing balance support: Bilateral upper extremity supported;During functional activity Standing balance-Leahy Scale: Poor Standing balance comment: reliant on UB support and increased base of support                           ADL either performed or assessed with clinical judgement   ADL Overall ADL's : Needs assistance/impaired     Grooming: Wash/dry face;Oral care;Wash/dry hands;Minimal assistance;Standing Grooming Details (indicate cue type  and reason): verbal cues for safety as patient does not bring feet close enough to sink cabinet             Lower Body Dressing: Min guard;Sitting/lateral leans Lower Body Dressing Details (indicate cue type and reason): patient able to doff socks and don shoes while seated edge of bed, min guard for safety with dynamic balance Toilet Transfer: Minimal assistance;Cueing for safety;Ambulation;Comfort height toilet;RW Armed forces technical officer Details (indicate cue type and reason): verbal cues for body mechanics to reach back when sitting Toileting- Clothing Manipulation and Hygiene: Min guard;Sit to/from stand Toileting - Clothing Manipulation Details (indicate cue type and reason): in standing for peri care after urinating d/t standing balance     Functional mobility during ADLs: Minimal assistance;Rolling walker;Cueing for safety;Cueing for sequencing General ADL Comments: verbal cues to not push rolling walker so far forward to keep her LEs within frame of walker               Cognition Arousal/Alertness: Awake/alert Behavior During Therapy: WFL for tasks assessed/performed Overall Cognitive Status: Within Functional Limits for tasks assessed                                                     Pertinent Vitals/ Pain       Pain Assessment: No/denies pain         Frequency  Min  2X/week        Progress Toward Goals  OT Goals(current goals can now be found in the care plan section)  Progress towards OT goals: Progressing toward goals  Acute Rehab OT Goals Patient Stated Goal: to go to CIR OT Goal Formulation: With patient Time For Goal Achievement: 07/18/19 Potential to Achieve Goals: Good ADL Goals Pt Will Perform Grooming: with supervision;sitting Pt Will Perform Upper Body Bathing: with supervision;sitting Pt Will Perform Lower Body Bathing: with supervision;sit to/from stand Pt Will Transfer to Toilet: with supervision;ambulating;bedside commode   Plan Discharge plan updated       AM-PAC OT "6 Clicks" Daily Activity     Outcome Measure   Help from another person eating meals?: None Help from another person taking care of personal grooming?: A Little Help from another person toileting, which includes using toliet, bedpan, or urinal?: A Little Help from another person bathing (including washing, rinsing, drying)?: A Lot Help from another person to put on and taking off regular upper body clothing?: A Little Help from another person to put on and taking off regular lower body clothing?: A Lot 6 Click Score: 17    End of Session Equipment Utilized During Treatment: Gait belt;Rolling walker  OT Visit Diagnosis: Unsteadiness on feet (R26.81);Muscle weakness (generalized) (M62.81)   Activity Tolerance Patient tolerated treatment well   Patient Left in chair;with call bell/phone within reach;with chair alarm set   Nurse Communication Mobility status        Time: 6811-5726 OT Time Calculation (min): 29 min  Charges: OT General Charges $OT Visit: 1 Visit OT Treatments $Self Care/Home Management : 23-37 mins  [place] OT office: [place] 07/06/2019, 12:24 PM

## 2019-07-06 NOTE — Progress Notes (Signed)
Inpatient Rehabilitation Admissions Coordinator  Inpatient rehab consult received. I met with patient at bedside for rehab assessment as well as spoke with her sister in law, Rollene Fare and sister , Enid Derry by phone. Patient refuses SNF. I will begin insurance authorization with [facility] for a possible admit. It is doubtful that insurance will approve and family as well as patient is aware.  Danne Baxter, RN, MSN Rehab Admissions Coordinator [phone] 07/06/2019 1:09 PM

## 2019-07-06 NOTE — Plan of Care (Signed)
  Problem: Education: Goal: Knowledge of General Education information will improve Description: Including pain rating scale, medication(s)/side effects and non-pharmacologic comfort measures Outcome: Progressing   Problem: Clinical Measurements: Goal: Diagnostic test results will improve Outcome: Progressing   Problem: Safety: Goal: Ability to remain free from injury will improve Outcome: Progressing   

## 2019-07-06 NOTE — Progress Notes (Signed)
Physical Therapy Treatment Patient Details Name: Renee [name] MRN: [id number] DOB: 13-Dec-1928 Today's Date: 07/06/2019    History of Present Illness 85 yo admitted with sepsis due to PNA and UIT PMH: L side weakness residual from CVA, uses walker, DM2, HTN, CKD IV    PT Comments    Patient progressing with ambulation distance/endurance and decreased assist for transfers.  Feel she is very eager to progress and return home with aide 9-3 6 d/week and HHPT.  She is agreeable and would benefit from CIR level rehab prior to d/c home to maximize safety, balance and endurance.  PT to follow.    Follow Up Recommendations  CIR     Equipment Recommendations  None recommended by PT    Recommendations for Other Services       Precautions / Restrictions Precautions Precautions: Fall Restrictions Weight Bearing Restrictions: No    Mobility  Bed Mobility Overal bed mobility: Needs Assistance Bed Mobility: Supine to Sit     Supine to sit: Min guard Sit to supine: Min assist   General bed mobility comments: assist for positioning in supine, cues and min A  Transfers Overall transfer level: Needs assistance Equipment used: Rolling walker (2 wheeled) Transfers: Sit to/from Stand Sit to Stand: Min assist         General transfer comment: increased time needed cues for hand placement  Ambulation/Gait Ambulation/Gait assistance: Min assist Gait Distance (Feet): 100 Feet Assistive device: Rolling walker (2 wheeled) Gait Pattern/deviations: Step-through pattern;Trunk flexed;Shuffle     General Gait Details: incresaed distance from walker over time.  Assist for balance, safety, stopped in middle to readjust posture and proximity for safety   Stairs             Wheelchair Mobility    Modified Rankin (Stroke Patients Only)       Balance Overall balance assessment: Needs assistance Sitting-balance support: Feet supported Sitting balance-Leahy Scale: Good Sitting  balance - Comments: seated EOB donned socks with increased time and supervision   Standing balance support: Bilateral upper extremity supported Standing balance-Leahy Scale: Poor Standing balance comment: UE support needed for balance               High Level Balance Comments: sit<>stand x 5 reps with min A and cues for controlled descent; standing heel raises min A with RW with imbalance noted            Cognition Arousal/Alertness: Awake/alert Behavior During Therapy: Flat affect Overall Cognitive Status: No family/caregiver present to determine baseline cognitive functioning                                 General Comments: eager to ambulate and work on being able to get home and take care of herself; some decreased safety awareness with cues needed for safety      Exercises      General Comments General comments (skin integrity, edema, etc.): HR max with mobility 93 bpm, dyspnea 2/4      Pertinent Vitals/Pain Pain Assessment: No/denies pain    Home Living                      Prior Function            PT Goals (current goals can now be found in the care plan section) Acute Rehab PT Goals Patient Stated Goal: to go to CIR Progress towards PT goals:  Progressing toward goals    Frequency    Min 3X/week      PT Plan Discharge plan needs to be updated;Frequency needs to be updated    Co-evaluation              AM-PAC PT "6 Clicks" Mobility   Outcome Measure  Help needed turning from your back to your side while in a flat bed without using bedrails?: A Little Help needed moving from lying on your back to sitting on the side of a flat bed without using bedrails?: A Little Help needed moving to and from a bed to a chair (including a wheelchair)?: A Little Help needed standing up from a chair using your arms (e.g., wheelchair or bedside chair)?: A Little Help needed to walk in hospital room?: A Little Help needed climbing 3-5  steps with a railing? : A Lot 6 Click Score: 17    End of Session Equipment Utilized During Treatment: Gait belt Activity Tolerance: Patient limited by fatigue Patient left: in bed;with call bell/phone within reach;with bed alarm set   PT Visit Diagnosis: Unsteadiness on feet (R26.81);Muscle weakness (generalized) (M62.81);History of falling (Z91.81);Other abnormalities of gait and mobility (R26.89)     Time: 1400-1426 PT Time Calculation (min) (ACUTE ONLY): 26 min  Charges:  $Gait Training: 8-22 mins $Therapeutic Activity: 8-22 mins                     Magda Kiel, [place] Acute Rehabilitation Services [phone] 07/06/2019    Reginia Naas 07/06/2019, 2:35 PM

## 2019-07-07 LAB — CBC WITH DIFFERENTIAL/PLATELET
Abs Immature Granulocytes: 0.13 10*3/uL — ABNORMAL HIGH (ref 0.00–0.07)
Basophils Absolute: 0 10*3/uL (ref 0.0–0.1)
Basophils Relative: 1 %
Eosinophils Absolute: 0.5 10*3/uL (ref 0.0–0.5)
Eosinophils Relative: 9 %
HCT: 32.5 % — ABNORMAL LOW (ref 36.0–46.0)
Hemoglobin: 10.2 g/dL — ABNORMAL LOW (ref 12.0–15.0)
Immature Granulocytes: 3 %
Lymphocytes Relative: 22 %
Lymphs Abs: 1.1 10*3/uL (ref 0.7–4.0)
MCH: 28.2 pg (ref 26.0–34.0)
MCHC: 31.4 g/dL (ref 30.0–36.0)
MCV: 89.8 fL (ref 80.0–100.0)
Monocytes Absolute: 0.5 10*3/uL (ref 0.1–1.0)
Monocytes Relative: 9 %
Neutro Abs: 2.9 10*3/uL (ref 1.7–7.7)
Neutrophils Relative %: 56 %
Platelets: 172 10*3/uL (ref 150–400)
RBC: 3.62 MIL/uL — ABNORMAL LOW (ref 3.87–5.11)
RDW: 13.7 % (ref 11.5–15.5)
WBC: 5.1 10*3/uL (ref 4.0–10.5)
nRBC: 0 % (ref 0.0–0.2)

## 2019-07-07 LAB — BASIC METABOLIC PANEL
Anion gap: 12 (ref 5–15)
BUN: 27 mg/dL — ABNORMAL HIGH (ref 8–23)
CO2: 23 mmol/L (ref 22–32)
Calcium: 8.9 mg/dL (ref 8.9–10.3)
Chloride: 106 mmol/L (ref 98–111)
Creatinine, Ser: 1.63 mg/dL — ABNORMAL HIGH (ref 0.44–1.00)
GFR calc Af Amer: 32 mL/min — ABNORMAL LOW (ref >60)
GFR calc non Af Amer: 28 mL/min — ABNORMAL LOW (ref >60)
Glucose, Bld: 125 mg/dL — ABNORMAL HIGH (ref 70–99)
Potassium: 4.6 mmol/L (ref 3.5–5.1)
Sodium: 141 mmol/L (ref 135–145)

## 2019-07-07 LAB — GLUCOSE, CAPILLARY
Glucose-Capillary: 123 mg/dL — ABNORMAL HIGH (ref 70–99)
Glucose-Capillary: 207 mg/dL — ABNORMAL HIGH (ref 70–99)
Glucose-Capillary: 144 mg/dL — ABNORMAL HIGH (ref 70–99)
Glucose-Capillary: 181 mg/dL — ABNORMAL HIGH (ref 70–99)

## 2019-07-07 MED ORDER — ALBUTEROL SULFATE (2.5 MG/3ML) 0.083% IN NEBU
2.5000 mg | INHALATION_SOLUTION | Freq: Three times a day (TID) | RESPIRATORY_TRACT | Status: DC
  Administered 2019-07-07 (×2): 2.5 mg via RESPIRATORY_TRACT
  Filled 2019-07-07 (×2): qty 3

## 2019-07-07 NOTE — Progress Notes (Signed)
Inpatient Rehabilitation Admissions Coordinator  I met with patient and her sister in law at bedside. I informed tham that I have not received decision form [employer] on a possible inpt rehab admit yet. Expect to hear Wednesday. Rollene Fare states that if patient is not approved, she will be prepared to take patient home tomorrow. They refuse SNF. I will follow up  .Danne Baxter, RN, MSN Rehab Admissions Coordinator [phone] 07/07/2019 3:17 PM

## 2019-07-07 NOTE — Discharge Instructions (Signed)
Urinary Tract Infection, Adult A urinary tract infection (UTI) is an infection of any part of the urinary tract. The urinary tract includes:  The kidneys.  The ureters.  The bladder.  The urethra. These organs make, store, and get rid of pee (urine) in the body. What are the causes? This is caused by germs (bacteria) in your genital area. These germs grow and cause swelling (inflammation) of your urinary tract. What increases the risk? You are more likely to develop this condition if:  You have a small, thin tube (catheter) to drain pee.  You cannot control when you pee or poop (incontinence).  You are female, and: ? You use these methods to prevent pregnancy: ? A medicine that kills sperm (spermicide). ? A device that blocks sperm (diaphragm). ? You have low levels of a female hormone (estrogen). ? You are pregnant.  You have genes that add to your risk.  You are sexually active.  You take antibiotic medicines.  You have trouble peeing because of: ? A prostate that is bigger than normal, if you are female. ? A blockage in the part of your body that drains pee from the bladder (urethra). ? A kidney stone. ? A nerve condition that affects your bladder (neurogenic bladder). ? Not getting enough to drink. ? Not peeing often enough.  You have other conditions, such as: ? Diabetes. ? A weak disease-fighting system (immune system). ? Sickle cell disease. ? Gout. ? Injury of the spine. What are the signs or symptoms? Symptoms of this condition include:  Needing to pee right away (urgently).  Peeing often.  Peeing small amounts often.  Pain or burning when peeing.  Blood in the pee.  Pee that smells bad or not like normal.  Trouble peeing.  Pee that is cloudy.  Fluid coming from the vagina, if you are female.  Pain in the belly or lower back. Other symptoms include:  Throwing up (vomiting).  No urge to eat.  Feeling mixed up (confused).  Being tired  and grouchy (irritable).  A fever.  Watery poop (diarrhea). How is this treated? This condition may be treated with:  Antibiotic medicine.  Other medicines.  Drinking enough water. Follow these instructions at home:  Medicines  Take over-the-counter and prescription medicines only as told by your doctor.  If you were prescribed an antibiotic medicine, take it as told by your doctor. Do not stop taking it even if you start to feel better. General instructions  Make sure you: ? Pee until your bladder is empty. ? Do not hold pee for a long time. ? Empty your bladder after sex. ? Wipe from front to back after pooping if you are a female. Use each tissue one time when you wipe.  Drink enough fluid to keep your pee pale yellow.  Keep all follow-up visits as told by your doctor. This is important. Contact a doctor if:  You do not get better after 1-2 days.  Your symptoms go away and then come back. Get help right away if:  You have very bad back pain.  You have very bad pain in your lower belly.  You have a fever.  You are sick to your stomach (nauseous).  You are throwing up. Summary  A urinary tract infection (UTI) is an infection of any part of the urinary tract.  This condition is caused by germs in your genital area.  There are many risk factors for a UTI. These include having a small, thin   tube to drain pee and not being able to control when you pee or poop.  Treatment includes antibiotic medicines for germs.  Drink enough fluid to keep your pee pale yellow. This information is not intended to replace advice given to you by your health care provider. Make sure you discuss any questions you have with your health care provider. Document Released: 01/23/2008 Document Revised: 07/24/2018 Document Reviewed: 02/13/2018 Elsevier Patient Education  2020 [employer]  

## 2019-07-07 NOTE — Plan of Care (Signed)
  Problem: Clinical Measurements: Goal: Respiratory complications will improve Outcome: Progressing   Problem: Activity: Goal: Risk for activity intolerance will decrease Outcome: Progressing   

## 2019-07-07 NOTE — Progress Notes (Signed)
PROGRESS NOTE    Shalisa Mcquade Carvell  BSW:[id number] DOB: [birth year] DOA: 06/30/2019 PCP: Abner Greenspan, MD   Brief Narrative:  Per HPI:  Myria Steenbergen Busickis a 85 y.o.femalewithhistory of stroke with left-sided weakness uses walker to ambulate, diabetes mellitus type 2, hypertension, chronic kidney disease stage IV presenting with nausea/vomiting and fever at home after eating supper the night before presentation.  ED evaluation noted leukocytosis of 17,000, blood glucose 307 UA is concerning for UTI. CT abdomen pelvis done shows nothing acute abdomen but does show bilateral pneumonia and COVID-19 test was negative. Given the septic-like picture patient was given fluid bolus and empiric antibiotics for pneumonia and admitted for further management.   Assessment & Plan:   Principal Problem:   Sepsis ([place]) Active Problems:   Type 2 diabetes, controlled, with retinopathy ([place])   Essential hypertension   Sleep apnea   Community acquired pneumonia   History of CVA (cerebrovascular accident)   CKD (chronic kidney disease) stage 4, GFR 15-29 ml/min ([place])   1. Sepsis secondary to community-acquired ammonia and E. coli UTI:  Sepsis parameters have resolved.  She is on room air and comfortable however she is little wheezy on exam today.  We will place her on 3 times daily scheduled DuoNeb for next 24 hours.  She is growing Enterobacter and E. coli in the urine which is sensitive to Bactrim so she was switched to Bactrim on 07/03/2019.  We will continue this. . 2. Diabetes mellitus type 2: Blood sugar fairly controlled. Continue SSI.  3. Chronic kidney disease stage III: At baseline.  Continue to watch Note that patient is on ACE inhibitor.  4. Physical Deconditioning Awaiting D/C to CIR pending insurance authorization.   DVT prophylaxis: Heparin Code Status: Full Family Communication:  no family member present.  Discussed with patient. Disposition Plan:  Discharge to CIR once insurance  approval received.  Most likely discharge tomorrow.   Consultants:   None  Procedures:   None  Antimicrobials:  Anti-infectives (From admission, onward)   Start     Dose/Rate Route Frequency Ordered Stop   07/03/19 2200  sulfamethoxazole-trimethoprim (BACTRIM DS) 800-160 MG per tablet 1 tablet     1 tablet Oral Every 12 hours 07/03/19 1811     07/03/19 1000  azithromycin (ZITHROMAX) tablet 500 mg     500 mg Oral Daily 07/03/19 0850 07/06/19 0842   07/02/19 1800  cefTRIAXone (ROCEPHIN) 2 g in sodium chloride 0.9 % 100 mL IVPB  Status:  Discontinued     2 g 200 mL/hr over 30 Minutes Intravenous Every 24 hours 07/01/19 0526 07/03/19 1811   07/02/19 0800  azithromycin (ZITHROMAX) 500 mg in sodium chloride 0.9 % 250 mL IVPB  Status:  Discontinued     500 mg 250 mL/hr over 60 Minutes Intravenous Every 24 hours 07/01/19 0526 07/03/19 0850   07/01/19 0430  cefTRIAXone (ROCEPHIN) 1 g in sodium chloride 0.9 % 100 mL IVPB     1 g 200 mL/hr over 30 Minutes Intravenous  Once 07/01/19 0428 07/01/19 0554   07/01/19 0430  azithromycin (ZITHROMAX) 500 mg in sodium chloride 0.9 % 250 mL IVPB     500 mg 250 mL/hr over 60 Minutes Intravenous  Once 07/01/19 0428 07/01/19 0657      Subjective: Patient seen and examined.  Some cough.  She has no other complaints.  Denies shortness of breath.  No fever.  Objective: Vitals:   07/07/19 0203 07/07/19 0422 07/07/19 0424 07/07/19 0900  BP:  (!) 156/85  (!) 141/63  Pulse:  (!) 58 (!) 58 69  Resp:  16    Temp:  97.9 F (36.6 C)  98.7 F (37.1 C)  TempSrc:  Oral  Oral  SpO2:  93% 96% 91%  Weight: 83.3 kg 83.3 kg    Height: 5\' 1"  (1.549 m)       Intake/Output Summary (Last 24 hours) at 07/07/2019 0948 Last data filed at 07/07/2019 0800 Gross per 24 hour  Intake 960 ml  Output 1000 ml  Net -40 ml   Filed Weights   07/07/19 0203 07/07/19 0422  Weight: 83.3 kg 83.3 kg    Examination:  General exam: Appears calm and comfortable   Respiratory system: Diffuse bilateral wheezes. Respiratory effort normal. Cardiovascular system: S1 & S2 heard, RRR. No JVD, murmurs, rubs, gallops or clicks. No pedal edema. Gastrointestinal system: Abdomen is nondistended, soft and nontender. No organomegaly or masses felt. Normal bowel sounds heard. Central nervous system: Alert and oriented. No focal neurological deficits. Extremities: Symmetric 5 x 5 power. Skin: No rashes, lesions or ulcers.  Psychiatry: Judgement and insight appear poor. Mood & affect appropriate.    Data Reviewed: I have personally reviewed following labs and imaging studies  CBC: Recent Labs  Lab 06/30/19 2118 07/01/19 0821 07/02/19 0450 07/03/19 0535 07/04/19 0356 07/07/19 0500  WBC 17.6* 11.2* 7.0 6.5 5.2 5.1  NEUTROABS 16.3*  --   --   --   --  2.9  HGB 12.3 9.9* 10.4* 10.6* 10.5* 10.2*  HCT 39.8 32.3* 33.9* 33.7* 33.4* 32.5*  MCV 91.5 92.3 93.4 90.6 90.8 89.8  PLT 197 151 163 169 175 789   Basic Metabolic Panel: Recent Labs  Lab 06/30/19 2118 07/01/19 0821 07/02/19 0450 07/03/19 0535 07/04/19 0356 07/07/19 0500  NA 138  --  141 142 141 141  K 4.2  --  3.9 3.9 4.1 4.6  CL 104  --  109 108 105 106  CO2 22  --  23 25 25 23   GLUCOSE 307*  --  142* 155* 158* 125*  BUN 28*  --  19 16 19  27*  CREATININE 1.59* 1.25* 1.31* 1.26* 1.30* 1.63*  CALCIUM 9.0  --  8.4* 8.5* 8.6* 8.9   GFR: Estimated Creatinine Clearance: 23.4 mL/min (A) (by C-G formula based on SCr of 1.63 mg/dL (H)). Liver Function Tests: Recent Labs  Lab 06/30/19 2118  AST 21  ALT 13  ALKPHOS 90  BILITOT 0.5  PROT 6.0*  ALBUMIN 3.7   Recent Labs  Lab 06/30/19 2118  LIPASE 20   No results for input(s): AMMONIA in the last 168 hours. Coagulation Profile: Recent Labs  Lab 06/30/19 2118  INR 1.0   Cardiac Enzymes: No results for input(s): CKTOTAL, CKMB, CKMBINDEX, TROPONINI in the last 168 hours. BNP (last 3 results) No results for input(s): PROBNP in the last  8760 hours. HbA1C: No results for input(s): HGBA1C in the last 72 hours. CBG: Recent Labs  Lab 07/06/19 0718 07/06/19 1118 07/06/19 1615 07/06/19 2041 07/07/19 0708  GLUCAP 146* 199* 90 185* 123*   Lipid Profile: No results for input(s): CHOL, HDL, LDLCALC, TRIG, CHOLHDL, LDLDIRECT in the last 72 hours. Thyroid Function Tests: No results for input(s): TSH, T4TOTAL, FREET4, T3FREE, THYROIDAB in the last 72 hours. Anemia Panel: No results for input(s): VITAMINB12, FOLATE, FERRITIN, TIBC, IRON, RETICCTPCT in the last 72 hours. Sepsis Labs: Recent Labs  Lab 07/01/19 0054 07/01/19 0435 07/01/19 0821  LATICACIDVEN 2.0* 1.6  1.1    Recent Results (from the past 240 hour(s))  SARS CORONAVIRUS 2 (TAT 6-24 HRS) Nasopharyngeal Nasopharyngeal Swab     Status: None   Collection Time: 06/30/19  9:12 PM   Specimen: Nasopharyngeal Swab  Result Value Ref Range Status   SARS Coronavirus 2 NEGATIVE NEGATIVE Final    Comment: (NOTE) SARS-CoV-2 target nucleic acids are NOT DETECTED. The SARS-CoV-2 RNA is generally detectable in upper and lower respiratory specimens during the acute phase of infection. Negative results do not preclude SARS-CoV-2 infection, do not rule out co-infections with other pathogens, and should not be used as the sole basis for treatment or other patient management decisions. Negative results must be combined with clinical observations, patient history, and epidemiological information. The expected result is Negative. Fact Sheet for Patients: SugarRoll.be Fact Sheet for Healthcare Providers: https://www.woods-mathews.com/ This test is not yet approved or cleared by the [place] FDA and  has been authorized for detection and/or diagnosis of SARS-CoV-2 by FDA under an Emergency Use Authorization (EUA). This EUA will remain  in effect (meaning this test can be used) for the duration of the COVID-19 declaration under  Section 56 4(b)(1) of the Act, 21 U.S.C. section 360bbb-3(b)(1), unless the authorization is terminated or revoked sooner. Performed at [facility], [place] [address], [place], [place] [address]   Culture, blood (routine x 2)     Status: None   Collection Time: 07/01/19 12:54 AM   Specimen: BLOOD RIGHT HAND  Result Value Ref Range Status   Specimen Description BLOOD RIGHT HAND  Final   Special Requests   Final    BOTTLES DRAWN AEROBIC AND ANAEROBIC Blood Culture results may not be optimal due to an inadequate volume of blood received in culture bottles   Culture   Final    NO GROWTH 5 DAYS Performed at [facility], [place] [address], [place],  [address]    Report Status 07/06/2019 FINAL  Final  Urine culture     Status: Abnormal   Collection Time: 07/01/19  2:45 AM   Specimen: Urine, Random  Result Value Ref Range Status   Specimen Description URINE, RANDOM  Final   Special Requests   Final    NONE Performed at [facility], [place] [address], [place], [place] [address]    Culture (A)  Final    >=100,000 COLONIES/mL ESCHERICHIA COLI >=100,000 COLONIES/mL ENTEROBACTER AEROGENES    Report Status 07/03/2019 FINAL  Final   Organism ID, Bacteria ESCHERICHIA COLI (A)  Final   Organism ID, Bacteria ENTEROBACTER AEROGENES (A)  Final      Susceptibility   Enterobacter aerogenes - MIC*    CEFAZOLIN >=64 RESISTANT Resistant     CEFTRIAXONE 16 INTERMEDIATE Intermediate     CIPROFLOXACIN <=0.25 SENSITIVE Sensitive     GENTAMICIN <=1 SENSITIVE Sensitive     IMIPENEM <=0.25 SENSITIVE Sensitive     NITROFURANTOIN 64 INTERMEDIATE Intermediate     TRIMETH/SULFA <=20 SENSITIVE Sensitive     PIP/TAZO >=128 RESISTANT Resistant     * >=100,000 COLONIES/mL ENTEROBACTER AEROGENES   Escherichia coli - MIC*    AMPICILLIN >=32 RESISTANT Resistant     CEFAZOLIN 16 SENSITIVE Sensitive     CEFTRIAXONE <=1 SENSITIVE Sensitive     CIPROFLOXACIN <=0.25 SENSITIVE Sensitive      GENTAMICIN <=1 SENSITIVE Sensitive     IMIPENEM <=0.25 SENSITIVE Sensitive     NITROFURANTOIN 256 RESISTANT Resistant     TRIMETH/SULFA <=20 SENSITIVE Sensitive  AMPICILLIN/SULBACTAM >=32 RESISTANT Resistant     PIP/TAZO 8 SENSITIVE Sensitive     Extended ESBL NEGATIVE Sensitive     * >=100,000 COLONIES/mL ESCHERICHIA COLI  Culture, blood (routine x 2)     Status: None   Collection Time: 07/01/19  4:35 AM   Specimen: BLOOD LEFT HAND  Result Value Ref Range Status   Specimen Description BLOOD LEFT HAND  Final   Special Requests   Final    BOTTLES DRAWN AEROBIC AND ANAEROBIC Blood Culture adequate volume   Culture   Final    NO GROWTH 5 DAYS Performed at [facility], [address], [place], [place] [address]    Report Status 07/06/2019 FINAL  Final    Radiology Studies: No results found.  Scheduled Meds: . albuterol  2.5 mg Nebulization TID  . amLODipine  10 mg Oral Daily  . aspirin  325 mg Oral Daily  . docusate sodium  200 mg Oral QHS  . ferrous sulfate  325 mg Oral BID WC  . gemfibrozil  600 mg Oral QAC breakfast  . guaiFENesin  600 mg Oral BID  . heparin  5,000 Units Subcutaneous Q8H  . hydrALAZINE  100 mg Oral Q8H  . insulin aspart  0-9 Units Subcutaneous TID WC  . metoprolol succinate  12.5 mg Oral BID  . mirtazapine  7.5 mg Oral QHS  . sertraline  50 mg Oral Daily  . sulfamethoxazole-trimethoprim  1 tablet Oral Q12H  . trandolapril  4 mg Oral Daily  . vitamin B-12  1,000 mcg Oral Daily   Continuous Infusions:    LOS: 6 days   Time spent: 24 minutes  Darliss Cheney, MD [facility]  If 7PM-7AM, please contact night-coverage [email or web address] Password [facility] 07/07/2019, 9:48 AM

## 2019-07-08 DIAGNOSIS — A419 Sepsis, unspecified organism: Secondary | ICD-10-CM

## 2019-07-08 LAB — BASIC METABOLIC PANEL
Anion gap: 9 (ref 5–15)
BUN: 27 mg/dL — ABNORMAL HIGH (ref 8–23)
CO2: 23 mmol/L (ref 22–32)
Calcium: 8.7 mg/dL — ABNORMAL LOW (ref 8.9–10.3)
Chloride: 105 mmol/L (ref 98–111)
Creatinine, Ser: 1.65 mg/dL — ABNORMAL HIGH (ref 0.44–1.00)
GFR calc Af Amer: 32 mL/min — ABNORMAL LOW (ref >60)
GFR calc non Af Amer: 27 mL/min — ABNORMAL LOW (ref >60)
Glucose, Bld: 259 mg/dL — ABNORMAL HIGH (ref 70–99)
Potassium: 4.8 mmol/L (ref 3.5–5.1)
Sodium: 137 mmol/L (ref 135–145)

## 2019-07-08 LAB — GLUCOSE, CAPILLARY
Glucose-Capillary: 136 mg/dL — ABNORMAL HIGH (ref 70–99)
Glucose-Capillary: 151 mg/dL — ABNORMAL HIGH (ref 70–99)
Glucose-Capillary: 129 mg/dL — ABNORMAL HIGH (ref 70–99)

## 2019-07-08 MED ORDER — ALBUTEROL SULFATE (2.5 MG/3ML) 0.083% IN NEBU
2.5000 mg | INHALATION_SOLUTION | Freq: Two times a day (BID) | RESPIRATORY_TRACT | Status: DC
  Administered 2019-07-08: 2.5 mg via RESPIRATORY_TRACT
  Filled 2019-07-08: qty 3

## 2019-07-08 NOTE — Progress Notes (Signed)
DISCHARGE NOTE HOME Renee [name] to be discharged Home per MD order. Discussed prescriptions and follow up appointments with the patient. Prescriptions given to patient; medication list explained in detail. Patient verbalized understanding.  Skin clean, dry and intact without evidence of skin break down, no evidence of skin tears noted. IV catheter discontinued intact. Site without signs and symptoms of complications. Dressing and pressure applied. Pt denies pain at the site currently. No complaints noted.  Patient free of lines, drains, and wounds.   An After Visit Summary (AVS) was printed and given to the patient. Patient escorted via wheelchair, and discharged home via private auto.  Aneta Mins BSN, RN3

## 2019-07-08 NOTE — Discharge Summary (Signed)
Physician Discharge Summary  Renee [name] LNL:[id number] DOB: Feb 13, 1929 DOA: 06/30/2019  PCP: Abner Greenspan, MD  Admit date: 06/30/2019 Discharge date: 07/08/2019  Admitted From: Home Disposition:  Home with home health   Recommendations for Outpatient Follow-up:  1. Follow up with PCP in 1 week  Discharge Condition: Stable CODE STATUS: Full  Diet recommendation: Heart healthy/carb modified   Brief/Interim Summary: Tamee A Busickis an 85 y.o.femalewithhistory of stroke with left-sided weakness who uses walker to ambulate, diabetes mellitus type 2, hypertension, chronic kidney disease stage IV presenting with nausea, vomiting, fever at home after eating supper the night before presentation. ED evaluation noted leukocytosis of 17,000, blood glucose 307, UA is concerning for UTI. CT abdomen pelvis done shows nothing acute abdomen but does show bilateral pneumonia and COVID-19 test was negative. Given the septic-like picture patient was given fluid bolus and empiric antibiotics for pneumonia, UTI and admitted for further management.  Discharge Diagnoses:  Principal Problem:   Sepsis ([place]) Active Problems:   Type 2 diabetes, controlled, with retinopathy ([place])   Essential hypertension   Sleep apnea   Community acquired pneumonia   History of CVA (cerebrovascular accident)   CKD (chronic kidney disease) stage 4, GFR 15-29 ml/min (HCC)   Sepsis secondary to CAP and E. coli UTI -Sepsis present on admission -Urine culture showed Enterobacter, E. coli sensitive to Bactrim -CT abdomen pelvis revealed left greater than right lower lobe airspace disease concerning for pneumonia, no focal abdominal findings -Blood cultures negative -COVID-19 negative -Completed total 7-day course of antibiotic  Type 2 diabetes -Sliding-scale insulin -Resume home meds  CKD stage III -Baseline creatinine 1.3-1.5 -Creatinine 1.65 today  Essential hypertension -Continue Norvasc, Toprol,  trandolapril, hydralazine  History of CVA -Continue aspirin  Depression/anxiety -Continue Zoloft, Xanax, Remeron  Physical deconditioning -CIR denied by insurance. Home with home health on discharge.    Discharge Instructions  Discharge Instructions    Call MD for:  difficulty breathing, headache or visual disturbances   Complete by: As directed    Call MD for:  extreme fatigue   Complete by: As directed    Call MD for:  persistant dizziness or light-headedness   Complete by: As directed    Call MD for:  persistant nausea and vomiting   Complete by: As directed    Call MD for:  severe uncontrolled pain   Complete by: As directed    Call MD for:  temperature >100.4   Complete by: As directed    Discharge instructions   Complete by: As directed    You were cared for by a hospitalist during your hospital stay. If you have any questions about your discharge medications or the care you received while you were in the hospital after you are discharged, you can call the unit and ask to speak with the hospitalist on call if the hospitalist that took care of you is not available. Once you are discharged, your primary care physician will handle any further medical issues. Please note that NO REFILLS for any discharge medications will be authorized once you are discharged, as it is imperative that you return to your primary care physician (or establish a relationship with a primary care physician if you do not have one) for your aftercare needs so that they can reassess your need for medications and monitor your lab values.   Increase activity slowly   Complete by: As directed      Allergies as of 07/08/2019      Reactions  Diclofenac Sodium Other (See Comments)   REACTION: angioedema   Pioglitazone Swelling      Medication List    STOP taking these medications   metroNIDAZOLE 500 MG tablet Commonly known as: FLAGYL     TAKE these medications   acetaminophen 500 MG  tablet Commonly known as: TYLENOL Take 1,000 mg by mouth at bedtime.   albuterol 108 (90 Base) MCG/ACT inhaler Commonly known as: VENTOLIN HFA Inhale 2 puffs into the lungs every 4 (four) hours as needed for wheezing.   ALPRAZolam 0.5 MG tablet Commonly known as: XANAX TAKE 1 TABLET BY MOUTH TWICE A DAY AS NEEDED FOR ANXIETY What changed: See the new instructions.   amLODipine 10 MG tablet Commonly known as: NORVASC Take 1 tablet (10 mg total) by mouth daily.   aspirin 325 MG tablet Take 325 mg by mouth daily.   beta carotene w/minerals tablet Take 1 tablet by mouth 2 (two) times daily.   calcium-vitamin D 500-200 MG-UNIT tablet Commonly known as: OSCAL WITH D Take 1 tablet by mouth daily.   diphenoxylate-atropine 2.5-0.025 MG tablet Commonly known as: LOMOTIL Take 1 tablet by mouth 2 (two) times daily as needed for diarrhea or loose stools.   docusate sodium 100 MG capsule Commonly known as: COLACE Take 200 mg by mouth at bedtime.   ferrous sulfate 325 (65 FE) MG tablet Take 325 mg by mouth 2 (two) times daily.   gemfibrozil 600 MG tablet Commonly known as: LOPID Take 1 tablet (600 mg total) by mouth daily.   glipiZIDE 2.5 MG 24 hr tablet Commonly known as: GLUCOTROL XL Take 1 tablet (2.5 mg total) by mouth daily with breakfast.   guaiFENesin 100 MG/5ML Soln Commonly known as: ROBITUSSIN Take 5 mLs by mouth every 4 (four) hours as needed for cough or to loosen phlegm.   hydrALAZINE 100 MG tablet Commonly known as: APRESOLINE Take 100 mg by mouth 3 (three) times daily.   metoprolol succinate 25 MG 24 hr tablet Commonly known as: TOPROL-XL Take 0.5 tablets (12.5 mg total) by mouth 2 (two) times daily.   mirtazapine 15 MG tablet Commonly known as: REMERON TAKE 1/2 TABLET BY MOUTH AT BEDTIME   multivitamin capsule Take 1 capsule by mouth daily.   ONE TOUCH ULTRA 2 w/Device Kit Check blood sugar once daily and as directed. Dx M42.6   OneTouch Delica  Lancets 83M Misc 1 Stick by Other route daily. Check blood sugar once daily and as directed. Dx E11.9   OneTouch Ultra test strip Generic drug: glucose blood CHECK BLOOD SUGAR ONCE DAILY AND AS DIRECTED. DX E11.9   REFRESH OP Place 1 drop into both eyes daily as needed (dry eyes).   sertraline 50 MG tablet Commonly known as: ZOLOFT Take 1 tablet (50 mg total) by mouth daily.   trandolapril 4 MG tablet Commonly known as: MAVIK Take 1 tablet (4 mg total) by mouth daily.   vitamin B-12 1000 MCG tablet Commonly known as: CYANOCOBALAMIN Take 1,000 mcg by mouth daily.   Vitamin D 1000 units capsule Take 1,000 Units by mouth daily.      Follow-up Information    Tower, Wynelle Fanny, MD Follow up in 1 week(s).   Specialties: Family Medicine, Radiology Contact information: [place] [address] [phone]          Allergies  Allergen Reactions  . Diclofenac Sodium Other (See Comments)    REACTION: angioedema  . Pioglitazone Swelling     Procedures/Studies:  Ct Abdomen Pelvis Wo Contrast  Result Date: 07/01/2019 CLINICAL DATA:  Nausea and vomiting beginning at 5 p.m. yesterday. EXAM: CT ABDOMEN AND PELVIS WITHOUT CONTRAST TECHNIQUE: Multidetector CT imaging of the abdomen and pelvis was performed following the standard protocol without IV contrast. COMPARISON:  CT of the abdomen and pelvis 12/16/2017 FINDINGS: Lower chest: Patchy airspace opacities are present at the left base. Mild ground-glass attenuation is present at the medial right base as well. A small pericardial effusion is chronic. Coronary artery calcifications are present. The heart is mildly enlarged. Hepatobiliary: No focal liver abnormality is seen. No gallstones, gallbladder wall thickening, or biliary dilatation. Pancreas: Unremarkable. No pancreatic ductal dilatation or surrounding inflammatory changes. Spleen: Normal in size without focal abnormality. Adrenals/Urinary Tract: Adrenal glands  are normal bilaterally. Water density lesions of the left kidney are stable, consistent with cysts. No stone or solid mass lesion is evident. Ureters are within normal limits. The urinary bladder is unremarkable. Stomach/Bowel: A moderate-sized hiatal hernia is again noted. Distal esophagus is unremarkable. Stomach and duodenum are otherwise within normal limits. Small mesenteric lymph nodes are similar the prior study. No focal inflammatory changes are present in the small bowel. There is no obstruction. Terminal ileum is within normal limits. The ascending and transverse colon are normal. Descending colon is within normal limits. Diverticular changes are present in the sigmoid colon. Vascular/Lymphatic: Atherosclerotic calcifications are present in the aorta and branch vessels. There is no aneurysm or significant change. Reproductive: Status post hysterectomy. No adnexal masses. Other: No abdominal wall hernia or abnormality. No abdominopelvic ascites. Musculoskeletal: Remote superior endplate fracture of L4 is stable. Superior endplate Schmorl's node at L2 is stable. Vertebral body heights are otherwise maintained. No acute or healing fractures are present. No focal lesions are evident. The hips are located and within normal limits. IMPRESSION: 1. Left greater than right lower lobe airspace disease is concerning for pneumonia. 2. No acute or focal abdominal lesion to explain the patient's symptoms. 3. Stable moderate-sized hiatal hernia. 4. Sigmoid diverticulosis without diverticulitis. 5. Aortic Atherosclerosis (ICD10-I70.0). 6. Coronary artery disease. 7. Stable small pericardial effusion. Electronically Signed   By: San Morelle M.D.   On: 07/01/2019 04:22   Dg Chest Port 1 View  Result Date: 06/30/2019 CLINICAL DATA:  Cough and fever EXAM: PORTABLE CHEST 1 VIEW COMPARISON:  12/11/2016 FINDINGS: Mild cardiomegaly is again seen but stable. The lungs are well aerated bilaterally. No focal infiltrate  or sizable effusion is seen. Mild central vascular congestion is noted with early interstitial edema. IMPRESSION: Mild changes of congestive failure. Electronically Signed   By: [name] M.D.   On: 06/30/2019 21:50       Discharge Exam: Vitals:   07/08/19 1300 07/08/19 1500  BP: 120/60 120/60  Pulse:    Resp:    Temp: 97.6 F (36.4 C)   SpO2: 94%      General: Pt is alert, awake, not in acute distress Cardiovascular: RRR, S1/S2 +, no edema Respiratory: CTA bilaterally, no wheezing, no rhonchi, no respiratory distress, no conversational dyspnea  Abdominal: Soft, NT, ND, bowel sounds + Extremities: no edema, no cyanosis Psych: Normal mood and affect, stable judgement and insight     The results of significant diagnostics from this hospitalization (including imaging, microbiology, ancillary and laboratory) are listed below for reference.     Microbiology: Recent Results (from the past 240 hour(s))  SARS CORONAVIRUS 2 (TAT 6-24 HRS) Nasopharyngeal Nasopharyngeal Swab     Status: None   Collection Time:  06/30/19  9:12 PM   Specimen: Nasopharyngeal Swab  Result Value Ref Range Status   SARS Coronavirus 2 NEGATIVE NEGATIVE Final    Comment: (NOTE) SARS-CoV-2 target nucleic acids are NOT DETECTED. The SARS-CoV-2 RNA is generally detectable in upper and lower respiratory specimens during the acute phase of infection. Negative results do not preclude SARS-CoV-2 infection, do not rule out co-infections with other pathogens, and should not be used as the sole basis for treatment or other patient management decisions. Negative results must be combined with clinical observations, patient history, and epidemiological information. The expected result is Negative. Fact Sheet for Patients: SugarRoll.be Fact Sheet for Healthcare Providers: https://www.woods-mathews.com/ This test is not yet approved or cleared by the [place] FDA and   has been authorized for detection and/or diagnosis of SARS-CoV-2 by FDA under an Emergency Use Authorization (EUA). This EUA will remain  in effect (meaning this test can be used) for the duration of the COVID-19 declaration under Section 56 4(b)(1) of the Act, 21 U.S.C. section 360bbb-3(b)(1), unless the authorization is terminated or revoked sooner. Performed at [facility], [place] [address] [place], [place] [address]   Culture, blood (routine x 2)     Status: None   Collection Time: 07/01/19 12:54 AM   Specimen: BLOOD RIGHT HAND  Result Value Ref Range Status   Specimen Description BLOOD RIGHT HAND  Final   Special Requests   Final    BOTTLES DRAWN AEROBIC AND ANAEROBIC Blood Culture results may not be optimal due to an inadequate volume of blood received in culture bottles   Culture   Final    NO GROWTH 5 DAYS Performed at [facility], [place] [address], [place], [place] [address]    Report Status 07/06/2019 FINAL  Final  Urine culture     Status: Abnormal   Collection Time: 07/01/19  2:45 AM   Specimen: Urine, Random  Result Value Ref Range Status   Specimen Description URINE, RANDOM  Final   Special Requests   Final    NONE Performed at [facility], [place] [address] [place], [place] [address]    Culture (A)  Final    >=100,000 COLONIES/mL ESCHERICHIA COLI >=100,000 COLONIES/mL ENTEROBACTER AEROGENES    Report Status 07/03/2019 FINAL  Final   Organism ID, Bacteria ESCHERICHIA COLI (A)  Final   Organism ID, Bacteria ENTEROBACTER AEROGENES (A)  Final      Susceptibility   Enterobacter aerogenes - MIC*    CEFAZOLIN >=64 RESISTANT Resistant     CEFTRIAXONE 16 INTERMEDIATE Intermediate     CIPROFLOXACIN <=0.25 SENSITIVE Sensitive     GENTAMICIN <=1 SENSITIVE Sensitive     IMIPENEM <=0.25 SENSITIVE Sensitive     NITROFURANTOIN 64 INTERMEDIATE Intermediate     TRIMETH/SULFA <=20 SENSITIVE Sensitive     PIP/TAZO >=128 RESISTANT Resistant     * >=100,000  COLONIES/mL ENTEROBACTER AEROGENES   Escherichia coli - MIC*    AMPICILLIN >=32 RESISTANT Resistant     CEFAZOLIN 16 SENSITIVE Sensitive     CEFTRIAXONE <=1 SENSITIVE Sensitive     CIPROFLOXACIN <=0.25 SENSITIVE Sensitive     GENTAMICIN <=1 SENSITIVE Sensitive     IMIPENEM <=0.25 SENSITIVE Sensitive     NITROFURANTOIN 256 RESISTANT Resistant     TRIMETH/SULFA <=20 SENSITIVE Sensitive     AMPICILLIN/SULBACTAM >=32 RESISTANT Resistant     PIP/TAZO 8 SENSITIVE Sensitive     Extended ESBL NEGATIVE Sensitive     * >=100,000 COLONIES/mL ESCHERICHIA  COLI  Culture, blood (routine x 2)     Status: None   Collection Time: 07/01/19  4:35 AM   Specimen: BLOOD LEFT HAND  Result Value Ref Range Status   Specimen Description BLOOD LEFT HAND  Final   Special Requests   Final    BOTTLES DRAWN AEROBIC AND ANAEROBIC Blood Culture adequate volume   Culture   Final    NO GROWTH 5 DAYS Performed at [facility], [address], [place], [place] [address]    Report Status 07/06/2019 FINAL  Final     Labs: BNP (last 3 results) No results for input(s): BNP in the last 8760 hours. Basic Metabolic Panel: Recent Labs  Lab 07/02/19 0450 07/03/19 0535 07/04/19 0356 07/07/19 0500 07/08/19 0846  NA 141 142 141 141 137  K 3.9 3.9 4.1 4.6 4.8  CL 109 108 105 106 105  CO2 23 25 25 23 23   GLUCOSE 142* 155* 158* 125* 259*  BUN 19 16 19  27* 27*  CREATININE 1.31* 1.26* 1.30* 1.63* 1.65*  CALCIUM 8.4* 8.5* 8.6* 8.9 8.7*   Liver Function Tests: No results for input(s): AST, ALT, ALKPHOS, BILITOT, PROT, ALBUMIN in the last 168 hours. No results for input(s): LIPASE, AMYLASE in the last 168 hours. No results for input(s): AMMONIA in the last 168 hours. CBC: Recent Labs  Lab 07/02/19 0450 07/03/19 0535 07/04/19 0356 07/07/19 0500  WBC 7.0 6.5 5.2 5.1  NEUTROABS  --   --   --  2.9  HGB 10.4* 10.6* 10.5* 10.2*  HCT 33.9* 33.7* 33.4* 32.5*  MCV 93.4 90.6 90.8 89.8  PLT 163 169 175 172    Cardiac Enzymes: No results for input(s): CKTOTAL, CKMB, CKMBINDEX, TROPONINI in the last 168 hours. BNP: Invalid input(s): POCBNP CBG: Recent Labs  Lab 07/07/19 1123 07/07/19 1629 07/07/19 2104 07/08/19 0653 07/08/19 1146  GLUCAP 207* 144* 181* 136* 151*   D-Dimer No results for input(s): DDIMER in the last 72 hours. Hgb A1c No results for input(s): HGBA1C in the last 72 hours. Lipid Profile No results for input(s): CHOL, HDL, LDLCALC, TRIG, CHOLHDL, LDLDIRECT in the last 72 hours. Thyroid function studies No results for input(s): TSH, T4TOTAL, T3FREE, THYROIDAB in the last 72 hours.  Invalid input(s): FREET3 Anemia work up No results for input(s): VITAMINB12, FOLATE, FERRITIN, TIBC, IRON, RETICCTPCT in the last 72 hours. Urinalysis    Component Value Date/Time   COLORURINE YELLOW 07/01/2019 0343   APPEARANCEUR HAZY (A) 07/01/2019 0343   LABSPEC 1.023 07/01/2019 0343   PHURINE 5.0 07/01/2019 0343   GLUCOSEU NEGATIVE 07/01/2019 0343   HGBUR NEGATIVE 07/01/2019 0343   HGBUR negative 03/27/2010 1358   BILIRUBINUR NEGATIVE 07/01/2019 0343   BILIRUBINUR neg. 06/18/2014 [employer] 07/01/2019 0343   PROTEINUR 30 (A) 07/01/2019 0343   UROBILINOGEN 0.2 06/18/2014 1155   UROBILINOGEN 0.2 09/04/2012 1135   NITRITE NEGATIVE 07/01/2019 0343   LEUKOCYTESUR SMALL (A) 07/01/2019 0343   Sepsis Labs Invalid input(s): PROCALCITONIN,  WBC,  LACTICIDVEN Microbiology Recent Results (from the past 240 hour(s))  SARS CORONAVIRUS 2 (TAT 6-24 HRS) Nasopharyngeal Nasopharyngeal Swab     Status: None   Collection Time: 06/30/19  9:12 PM   Specimen: Nasopharyngeal Swab  Result Value Ref Range Status   SARS Coronavirus 2 NEGATIVE NEGATIVE Final    Comment: (NOTE) SARS-CoV-2 target nucleic acids are NOT DETECTED. The SARS-CoV-2 RNA is generally detectable in upper and lower respiratory specimens during the acute phase of infection.  Negative results do not preclude  SARS-CoV-2 infection, do not rule out co-infections with other pathogens, and should not be used as the sole basis for treatment or other patient management decisions. Negative results must be combined with clinical observations, patient history, and epidemiological information. The expected result is Negative. Fact Sheet for Patients: SugarRoll.be Fact Sheet for Healthcare Providers: https://www.woods-mathews.com/ This test is not yet approved or cleared by the [place] FDA and  has been authorized for detection and/or diagnosis of SARS-CoV-2 by FDA under an Emergency Use Authorization (EUA). This EUA will remain  in effect (meaning this test can be used) for the duration of the COVID-19 declaration under Section 56 4(b)(1) of the Act, 21 U.S.C. section 360bbb-3(b)(1), unless the authorization is terminated or revoked sooner. Performed at [facility], [place] [address] [place], [place] [address]   Culture, blood (routine x 2)     Status: None   Collection Time: 07/01/19 12:54 AM   Specimen: BLOOD RIGHT HAND  Result Value Ref Range Status   Specimen Description BLOOD RIGHT HAND  Final   Special Requests   Final    BOTTLES DRAWN AEROBIC AND ANAEROBIC Blood Culture results may not be optimal due to an inadequate volume of blood received in culture bottles   Culture   Final    NO GROWTH 5 DAYS Performed at [facility], [place] [address], [place], [place] [address]    Report Status 07/06/2019 FINAL  Final  Urine culture     Status: Abnormal   Collection Time: 07/01/19  2:45 AM   Specimen: Urine, Random  Result Value Ref Range Status   Specimen Description URINE, RANDOM  Final   Special Requests   Final    NONE Performed at [facility], [place] [address] [place], [place] [address]    Culture (A)  Final    >=100,000 COLONIES/mL ESCHERICHIA COLI >=100,000 COLONIES/mL ENTEROBACTER AEROGENES    Report Status 07/03/2019  FINAL  Final   Organism ID, Bacteria ESCHERICHIA COLI (A)  Final   Organism ID, Bacteria ENTEROBACTER AEROGENES (A)  Final      Susceptibility   Enterobacter aerogenes - MIC*    CEFAZOLIN >=64 RESISTANT Resistant     CEFTRIAXONE 16 INTERMEDIATE Intermediate     CIPROFLOXACIN <=0.25 SENSITIVE Sensitive     GENTAMICIN <=1 SENSITIVE Sensitive     IMIPENEM <=0.25 SENSITIVE Sensitive     NITROFURANTOIN 64 INTERMEDIATE Intermediate     TRIMETH/SULFA <=20 SENSITIVE Sensitive     PIP/TAZO >=128 RESISTANT Resistant     * >=100,000 COLONIES/mL ENTEROBACTER AEROGENES   Escherichia coli - MIC*    AMPICILLIN >=32 RESISTANT Resistant     CEFAZOLIN 16 SENSITIVE Sensitive     CEFTRIAXONE <=1 SENSITIVE Sensitive     CIPROFLOXACIN <=0.25 SENSITIVE Sensitive     GENTAMICIN <=1 SENSITIVE Sensitive     IMIPENEM <=0.25 SENSITIVE Sensitive     NITROFURANTOIN 256 RESISTANT Resistant     TRIMETH/SULFA <=20 SENSITIVE Sensitive     AMPICILLIN/SULBACTAM >=32 RESISTANT Resistant     PIP/TAZO 8 SENSITIVE Sensitive     Extended ESBL NEGATIVE Sensitive     * >=100,000 COLONIES/mL ESCHERICHIA COLI  Culture, blood (routine x 2)     Status: None   Collection Time: 07/01/19  4:35 AM   Specimen: BLOOD LEFT HAND  Result Value Ref Range Status   Specimen Description BLOOD LEFT HAND  Final   Special Requests   Final    BOTTLES  DRAWN AEROBIC AND ANAEROBIC Blood Culture adequate volume   Culture   Final    NO GROWTH 5 DAYS Performed at [facility], [place] [address], [place], [place] [address]    Report Status 07/06/2019 FINAL  Final     Patient was seen and examined on the day of discharge and was found to be in stable condition. Time coordinating discharge: 45 minutes including assessment and coordination of care, as well as examination of the patient.   SIGNED:  Dessa Phi, DO [facility] 07/08/2019, 4:38 PM

## 2019-07-08 NOTE — TOC Transition Note (Signed)
Transition of Care [facility]) - CM/SW Discharge Note   Patient Details  Name: Renee [name] MRN: [id number] Date of Birth: Nov 30, 1928  [facility] CM/SW Contact:  Bartholomew Crews, RN Phone Number: [phone] 07/08/2019, 4:56 PM   Clinical Narrative:    Received message that [employer] denied CIR stay. Patient progressing well per PT notes with current recommendations for [facility] PT. Orders provided for [facility] PT, OT. Spoke with patient at the bedside who stated that she had recently had therapy with [facility]. Spoke with liaison for [facility] - referral accepted for PT, OT for start of care on Friday. Patient stated that her sister in law will pick her up, and that she has a caregiver at home with her. No further transition needs identified.     Final next level of care: [place] Barriers to Discharge: No Barriers Identified   Patient Goals and CMS Choice Patient states their goals for this hospitalization and ongoing recovery are:: return home with caregiver CMS Medicare.gov Compare Post Acute Care list provided to:: Patient Choice offered to / list presented to : Patient  Discharge Placement                       Discharge Plan and Services                DME Arranged: N/A DME Agency: NA       HH Arranged: PT, OT HH Agency: [facility] Date [facility] Agency Contacted: 07/08/19 Time [place]: 1656 Representative spoke with at [place]: [place] ([place]) Interventions     Readmission Risk Interventions No flowsheet data found.

## 2019-07-08 NOTE — Progress Notes (Signed)
Physical Therapy Treatment Patient Details Name: Renee [name] MRN: [id number] DOB: 09-02-1928 Today's Date: 07/08/2019    History of Present Illness 85 yo admitted with sepsis due to PNA and UIT PMH: L side weakness residual from CVA, uses walker, DM2, HTN, CKD IV    PT Comments    Pt is making good progress with functional mobility as she is able to complete sit<>stand transfers with CGA and ambulate increased distances with RW & CGA. Pt's sister-in-law is present for session & reports pt is ambulating "better" than she was prior to admission and pt uses a RW at baseline. Due to pt's good progress d/c recommendations have been upgraded to home with HHPT & supervision for mobility. Will continue to follow acutely to focus on gait & balance prior to d/c.    Follow Up Recommendations  Home health PT;Supervision for mobility/OOB     Equipment Recommendations  None recommended by PT    Recommendations for Other Services       Precautions / Restrictions Precautions Precautions: Fall Restrictions Weight Bearing Restrictions: No    Mobility  Bed Mobility                  Transfers Overall transfer level: Needs assistance Equipment used: Rolling walker (2 wheeled) Transfers: Sit to/from Stand Sit to Stand: Min guard         General transfer comment: increased time for sit>stand but pt able to power up  Ambulation/Gait Ambulation/Gait assistance: Min assist Gait Distance (Feet): 200 Feet Assistive device: Rolling walker (2 wheeled) Gait Pattern/deviations: Step-through pattern;Trunk flexed;Shuffle Gait velocity: decreased   General Gait Details: pt with impaired gait LLE 2/2 old CVA, forward flexed posture, pt's sister in law reports pt is ambulating better than she was prior to admission   Stairs             Wheelchair Mobility    Modified Rankin (Stroke Patients Only)       Balance Overall balance assessment: Needs assistance          Standing balance support: Bilateral upper extremity supported;During functional activity Standing balance-Leahy Scale: Poor Standing balance comment: BUE support on RW during gait                            Cognition Arousal/Alertness: Awake/alert Behavior During Therapy: Flat affect Overall Cognitive Status: Difficult to assess                                        Exercises      General Comments General comments (skin integrity, edema, etc.): pt reports feeling good after ambulating, only reporting slight fatigue      Pertinent Vitals/Pain Pain Assessment: No/denies pain    Home Living                      Prior Function            PT Goals (current goals can now be found in the care plan section) Acute Rehab PT Goals Patient Stated Goal: none stated Time For Goal Achievement: 07/16/19 Potential to Achieve Goals: Good Progress towards PT goals: Progressing toward goals    Frequency    Min 3X/week      PT Plan Discharge plan needs to be updated    Co-evaluation  AM-PAC PT "6 Clicks" Mobility   Outcome Measure  Help needed turning from your back to your side while in a flat bed without using bedrails?: A Little Help needed moving from lying on your back to sitting on the side of a flat bed without using bedrails?: A Little Help needed moving to and from a bed to a chair (including a wheelchair)?: A Little Help needed standing up from a chair using your arms (e.g., wheelchair or bedside chair)?: A Little Help needed to walk in hospital room?: A Little Help needed climbing 3-5 steps with a railing? : A Lot 6 Click Score: 17    End of Session Equipment Utilized During Treatment: Gait belt Activity Tolerance: Patient tolerated treatment well Patient left: in chair;with call bell/phone within reach;with chair alarm set;with family/visitor present   PT Visit Diagnosis: Unsteadiness on feet  (R26.81);Muscle weakness (generalized) (M62.81);History of falling (Z91.81);Other abnormalities of gait and mobility (R26.89)     Time: 6681-5947 PT Time Calculation (min) (ACUTE ONLY): 11 min  Charges:  $Therapeutic Activity: 8-22 mins                         Waunita Schooner, PT, DPT 07/08/2019, 3:01 PM

## 2019-07-08 NOTE — Progress Notes (Signed)
Inpatient Rehabilitation Admissions Coordinator  I have received a denial for inpt rehab admit from [facility]. I met with patient at bedside and left a voicemail for her sister in [place], [place] of denial. I have notified Dr. Maylene Roes, RN CM and SW. We will sign off at this time.  Danne Baxter, RN, MSN Rehab Admissions Coordinator [phone] 07/08/2019 4:41 PM

## 2019-07-09 ENCOUNTER — Telehealth: Payer: Self-pay | Admitting: Family Medicine

## 2019-07-09 NOTE — Telephone Encounter (Signed)
Transition Care Management Follow-up Telephone Call   Date discharged? 07/08/2019   How have you been since you were released from the hospital? "Last night was terrible! I was completely drenched with sweat when I woke up in the middle of the night, I had to get up and change clothes and it was hard to get back to sleep. My bowel movements are normal. I am getting ready to try and eat breakfast. I feel like I need in home therapy (PT) - Hospital recommended PT 3hrs a day for two weeks."   Do you understand why you were in the hospital? yes   Do you understand the discharge instructions? yes   Where were you discharged to? Home with Home Health - I have a care taker from 9am-3pm x 6 days a week - Friend. Home health has not set up initial visits.    Items Reviewed:  Medications reviewed: yes  Allergies reviewed: yes  Dietary changes reviewed: yes  Referrals reviewed: yes   Functional Questionnaire:  Activities of Daily Living (ADLs):   She states they are independent in the following: ambulation, bathing and hygiene, continence, grooming, toileting and My Caretaker helps me with daily routine.  States they require assistance with the following: feeding and dressing , Caretaker helps with this.    Any transportation issues/concerns?: yes, Caretaker [facility]) can help with a virtual visit   Any patient concerns? no, Just wanting to get set up with Home PT   Confirmed importance and date/time of follow-up visits scheduled yes  Provider Appointment booked with Dr Glori Bickers 07/13/2019 at 10:45am - Pt requests IN OFFICE, Sister will bring her to the appt.   Confirmed with patient if condition begins to worsen call PCP or go to the ER.  Patient was given the office number and encouraged to call back with question or concerns.  : yes

## 2019-07-09 NOTE — Telephone Encounter (Signed)
If she still has a fever or if she has any cough or shortness of breath then it has to be virtual (even though she was tested covid neg in the hospital)  If she is feeling worse -she may need to go back to the ED   Let me know   Sending this to Ashtyn and Shapale both

## 2019-07-09 NOTE — Telephone Encounter (Signed)
Patient requested IN OFFICE appt.  Scheduled for 07/13/2019 at 10:45   Are you okay with keeping this in office?

## 2019-07-09 NOTE — Telephone Encounter (Signed)
Spoke with patient, negative on all symptoms. Pt will let us know if symptoms arise. Aware that she will most likely be screened again prior to coming in on Monday to ensure she is Covid sx free.   Questions for Screening COVID-19  During this illness, did/does the patient experience any of the following symptoms? Fever >100.73F []   Yes [x]   No []   Unknown Subjective fever (felt feverish) []   Yes [x]   No []   Unknown Chills []   Yes [x]   No []   Unknown Muscle aches (myalgia) []   Yes [x]   No []   Unknown Runny nose (rhinorrhea) []   Yes [x]   No []   Unknown Sore throat []   Yes [x]   No []   Unknown Cough (new onset or worsening of chronic cough) []   Yes [x]   No []   Unknown Shortness of breath (dyspnea) []   Yes [x]   No []   Unknown Nausea or vomiting []   Yes [x]   No []   Unknown Headache []   Yes [x]   No []   Unknown Abdominal pain  []   Yes [x]   No []   Unknown Diarrhea (?3 loose/looser than normal stools/24hr period) []   Yes [x]   No []   Unknown Other, specify:

## 2019-07-11 DIAGNOSIS — M519 Unspecified thoracic, thoracolumbar and lumbosacral intervertebral disc disorder: Secondary | ICD-10-CM | POA: Diagnosis not present

## 2019-07-11 DIAGNOSIS — I2510 Atherosclerotic heart disease of native coronary artery without angina pectoris: Secondary | ICD-10-CM | POA: Diagnosis not present

## 2019-07-11 DIAGNOSIS — I129 Hypertensive chronic kidney disease with stage 1 through stage 4 chronic kidney disease, or unspecified chronic kidney disease: Secondary | ICD-10-CM | POA: Diagnosis not present

## 2019-07-11 DIAGNOSIS — I700 Atherosclerosis of aorta: Secondary | ICD-10-CM | POA: Diagnosis not present

## 2019-07-11 DIAGNOSIS — G8929 Other chronic pain: Secondary | ICD-10-CM | POA: Diagnosis not present

## 2019-07-11 DIAGNOSIS — E11319 Type 2 diabetes mellitus with unspecified diabetic retinopathy without macular edema: Secondary | ICD-10-CM | POA: Diagnosis not present

## 2019-07-11 DIAGNOSIS — M1990 Unspecified osteoarthritis, unspecified site: Secondary | ICD-10-CM | POA: Diagnosis not present

## 2019-07-11 DIAGNOSIS — N184 Chronic kidney disease, stage 4 (severe): Secondary | ICD-10-CM | POA: Diagnosis not present

## 2019-07-11 DIAGNOSIS — E1122 Type 2 diabetes mellitus with diabetic chronic kidney disease: Secondary | ICD-10-CM | POA: Diagnosis not present

## 2019-07-11 DIAGNOSIS — I69354 Hemiplegia and hemiparesis following cerebral infarction affecting left non-dominant side: Secondary | ICD-10-CM | POA: Diagnosis not present

## 2019-07-13 ENCOUNTER — Ambulatory Visit (INDEPENDENT_AMBULATORY_CARE_PROVIDER_SITE_OTHER): Payer: Medicare HMO | Admitting: Family Medicine

## 2019-07-13 ENCOUNTER — Encounter: Payer: Self-pay | Admitting: Family Medicine

## 2019-07-13 ENCOUNTER — Other Ambulatory Visit: Payer: Self-pay

## 2019-07-13 VITALS — BP 132/72 | HR 76 | Temp 97.1°F | Ht 61.0 in | Wt 177.5 lb

## 2019-07-13 DIAGNOSIS — Z791 Long term (current) use of non-steroidal anti-inflammatories (NSAID): Secondary | ICD-10-CM

## 2019-07-13 DIAGNOSIS — R32 Unspecified urinary incontinence: Secondary | ICD-10-CM

## 2019-07-13 DIAGNOSIS — I700 Atherosclerosis of aorta: Secondary | ICD-10-CM | POA: Diagnosis not present

## 2019-07-13 DIAGNOSIS — J189 Pneumonia, unspecified organism: Secondary | ICD-10-CM

## 2019-07-13 DIAGNOSIS — N184 Chronic kidney disease, stage 4 (severe): Secondary | ICD-10-CM

## 2019-07-13 DIAGNOSIS — Z8619 Personal history of other infectious and parasitic diseases: Secondary | ICD-10-CM

## 2019-07-13 DIAGNOSIS — M1990 Unspecified osteoarthritis, unspecified site: Secondary | ICD-10-CM | POA: Diagnosis not present

## 2019-07-13 DIAGNOSIS — N289 Disorder of kidney and ureter, unspecified: Secondary | ICD-10-CM

## 2019-07-13 DIAGNOSIS — Z7409 Other reduced mobility: Secondary | ICD-10-CM

## 2019-07-13 DIAGNOSIS — K5730 Diverticulosis of large intestine without perforation or abscess without bleeding: Secondary | ICD-10-CM

## 2019-07-13 DIAGNOSIS — M519 Unspecified thoracic, thoracolumbar and lumbosacral intervertebral disc disorder: Secondary | ICD-10-CM | POA: Diagnosis not present

## 2019-07-13 DIAGNOSIS — G4700 Insomnia, unspecified: Secondary | ICD-10-CM

## 2019-07-13 DIAGNOSIS — F418 Other specified anxiety disorders: Secondary | ICD-10-CM

## 2019-07-13 DIAGNOSIS — I129 Hypertensive chronic kidney disease with stage 1 through stage 4 chronic kidney disease, or unspecified chronic kidney disease: Secondary | ICD-10-CM | POA: Diagnosis not present

## 2019-07-13 DIAGNOSIS — I2510 Atherosclerotic heart disease of native coronary artery without angina pectoris: Secondary | ICD-10-CM | POA: Diagnosis not present

## 2019-07-13 DIAGNOSIS — E11319 Type 2 diabetes mellitus with unspecified diabetic retinopathy without macular edema: Secondary | ICD-10-CM | POA: Diagnosis not present

## 2019-07-13 DIAGNOSIS — Z87440 Personal history of urinary (tract) infections: Secondary | ICD-10-CM

## 2019-07-13 DIAGNOSIS — Z8701 Personal history of pneumonia (recurrent): Secondary | ICD-10-CM

## 2019-07-13 DIAGNOSIS — E538 Deficiency of other specified B group vitamins: Secondary | ICD-10-CM

## 2019-07-13 DIAGNOSIS — E785 Hyperlipidemia, unspecified: Secondary | ICD-10-CM

## 2019-07-13 DIAGNOSIS — Z7984 Long term (current) use of oral hypoglycemic drugs: Secondary | ICD-10-CM

## 2019-07-13 DIAGNOSIS — I10 Essential (primary) hypertension: Secondary | ICD-10-CM | POA: Diagnosis not present

## 2019-07-13 DIAGNOSIS — Z7982 Long term (current) use of aspirin: Secondary | ICD-10-CM

## 2019-07-13 DIAGNOSIS — F4321 Adjustment disorder with depressed mood: Secondary | ICD-10-CM

## 2019-07-13 DIAGNOSIS — G8929 Other chronic pain: Secondary | ICD-10-CM | POA: Diagnosis not present

## 2019-07-13 DIAGNOSIS — M8580 Other specified disorders of bone density and structure, unspecified site: Secondary | ICD-10-CM

## 2019-07-13 DIAGNOSIS — Z9181 History of falling: Secondary | ICD-10-CM

## 2019-07-13 DIAGNOSIS — I69354 Hemiplegia and hemiparesis following cerebral infarction affecting left non-dominant side: Secondary | ICD-10-CM | POA: Diagnosis not present

## 2019-07-13 DIAGNOSIS — E1122 Type 2 diabetes mellitus with diabetic chronic kidney disease: Secondary | ICD-10-CM | POA: Diagnosis not present

## 2019-07-13 DIAGNOSIS — J1289 Other viral pneumonia: Secondary | ICD-10-CM

## 2019-07-13 LAB — RENAL FUNCTION PANEL
Albumin: 3.6 g/dL (ref 3.5–5.2)
BUN: 26 mg/dL — ABNORMAL HIGH (ref 6–23)
CO2: 25 mEq/L (ref 19–32)
Calcium: 9 mg/dL (ref 8.4–10.5)
Chloride: 105 mEq/L (ref 96–112)
Creatinine, Ser: 1.46 mg/dL — ABNORMAL HIGH (ref 0.40–1.20)
GFR: 33.78 mL/min — ABNORMAL LOW (ref >60.00)
Glucose, Bld: 175 mg/dL — ABNORMAL HIGH (ref 70–99)
Phosphorus: 3.6 mg/dL (ref 2.3–4.6)
Potassium: 4.8 mEq/L (ref 3.5–5.1)
Sodium: 139 mEq/L (ref 135–145)

## 2019-07-13 MED ORDER — ALBUTEROL SULFATE HFA 108 (90 BASE) MCG/ACT IN AERS: 2.0000 | INHALATION_SPRAY | RESPIRATORY_TRACT | 3 refills | Status: DC | PRN

## 2019-07-13 NOTE — Assessment & Plan Note (Signed)
bp in fair control at this time  BP Readings from Last 1 Encounters:  07/13/19 132/72   No changes needed Most recent labs reviewed  Disc lifstyle change with low sodium diet and exercise

## 2019-07-13 NOTE — Assessment & Plan Note (Addendum)
Incidental finding on her CT  No symptoms  bp and DM are well controlled  Takes gemfibrozil and apprehensive to add statin due to side eff poss

## 2019-07-13 NOTE — Assessment & Plan Note (Signed)
S/p sepsis thought to be due to CAP and uti  Now resolved after tx with abx  Reviewed hospital records, lab results and studies in detail

## 2019-07-13 NOTE — Patient Instructions (Signed)
Keep an eye on your breathing  If no improvement- let us know  Keep drinking fluids   I think it will take a while for energy level to come back  Do your physical therapy  I sent in your albuterol inhaler   Labs for kidney function today to re check   Glad you are doing better

## 2019-07-13 NOTE — Progress Notes (Signed)
Subjective:    Patient ID: Renee [name], female    DOB: 06-Sep-1928, 85 y.o.   MRN: [id number]  HPI Pt presents for f/u of hospitalization from11/10 to 11/18 for sepsis due to CAP and e coli uti  TCM call was done 11/19   She presented on day of admission with n/v fever  Hospital course:  Sepsis secondary to CAP and E. coli UTI -Sepsis present on admission -Urine culture showed Enterobacter, E. coli sensitive to Bactrim -CT abdomen pelvis revealed left greater than right lower lobe airspace disease concerning for pneumonia, no focal abdominal findings -Blood cultures negative -COVID-19 negative -Completed total 7-day course of antibiotic  Reviewed neg blood cx and neg covid test   Ct Abdomen Pelvis Wo Contrast  Result Date: 07/01/2019 CLINICAL DATA:  Nausea and vomiting beginning at 5 p.m. yesterday. EXAM: CT ABDOMEN AND PELVIS WITHOUT CONTRAST TECHNIQUE: Multidetector CT imaging of the abdomen and pelvis was performed following the standard protocol without IV contrast. COMPARISON:  CT of the abdomen and pelvis 12/16/2017 FINDINGS: Lower chest: Patchy airspace opacities are present at the left base. Mild ground-glass attenuation is present at the medial right base as well. A small pericardial effusion is chronic. Coronary artery calcifications are present. The heart is mildly enlarged. Hepatobiliary: No focal liver abnormality is seen. No gallstones, gallbladder wall thickening, or biliary dilatation. Pancreas: Unremarkable. No pancreatic ductal dilatation or surrounding inflammatory changes. Spleen: Normal in size without focal abnormality. Adrenals/Urinary Tract: Adrenal glands are normal bilaterally. Water density lesions of the left kidney are stable, consistent with cysts. No stone or solid mass lesion is evident. Ureters are within normal limits. The urinary bladder is unremarkable. Stomach/Bowel: A moderate-sized hiatal hernia is again noted. Distal esophagus is unremarkable.  Stomach and duodenum are otherwise within normal limits. Small mesenteric lymph nodes are similar the prior study. No focal inflammatory changes are present in the small bowel. There is no obstruction. Terminal ileum is within normal limits. The ascending and transverse colon are normal. Descending colon is within normal limits. Diverticular changes are present in the sigmoid colon. Vascular/Lymphatic: Atherosclerotic calcifications are present in the aorta and branch vessels. There is no aneurysm or significant change. Reproductive: Status post hysterectomy. No adnexal masses. Other: No abdominal wall hernia or abnormality. No abdominopelvic ascites. Musculoskeletal: Remote superior endplate fracture of L4 is stable. Superior endplate Schmorl's node at L2 is stable. Vertebral body heights are otherwise maintained. No acute or healing fractures are present. No focal lesions are evident. The hips are located and within normal limits. IMPRESSION: 1. Left greater than right lower lobe airspace disease is concerning for pneumonia. 2. No acute or focal abdominal lesion to explain the patient's symptoms. 3. Stable moderate-sized hiatal hernia. 4. Sigmoid diverticulosis without diverticulitis. 5. Aortic Atherosclerosis (ICD10-I70.0). 6. Coronary artery disease. 7. Stable small pericardial effusion. Electronically Signed   By: San Morelle M.D.   On: 07/01/2019 04:22   Dg Chest Port 1 View  Result Date: 06/30/2019 CLINICAL DATA:  Cough and fever EXAM: PORTABLE CHEST 1 VIEW COMPARISON:  12/11/2016 FINDINGS: Mild cardiomegaly is again seen but stable. The lungs are well aerated bilaterally. No focal infiltrate or sizable effusion is seen. Mild central vascular congestion is noted with early interstitial edema. IMPRESSION: Mild changes of congestive failure. Electronically Signed   By: [name] M.D.   On: 06/30/2019 21:50      Type 2 diabetes -Sliding-scale insulin -Resume home meds  CKD stage  III -Baseline creatinine 1.3-1.5 -Creatinine 1.65  today  Lab Results  Component Value Date   CREATININE 1.65 (H) 07/08/2019   BUN 27 (H) 07/08/2019   NA 137 07/08/2019   K 4.8 07/08/2019   CL 105 07/08/2019   CO2 23 07/08/2019  already finished last dose of abx    Last cbc Lab Results  Component Value Date   WBC 5.1 07/07/2019   HGB 10.2 (L) 07/07/2019   HCT 32.5 (L) 07/07/2019   MCV 89.8 07/07/2019   PLT 172 07/07/2019   fairly stable from baseline (anemia of chronic dz)  she does feel tired    Essential hypertension -Continue Norvasc, Toprol, trandolapril, hydralazine BP: 132/72  Good bp today   History of CVA -Continue aspirin  Depression/anxiety -Continue Zoloft, Xanax, Remeron  Physical deconditioning -CIR denied by insurance. Home with home health on discharge She is getting home care-with PT  Is helpful  Anxious to start walking again    Wt Readings from Last 3 Encounters:  07/13/19 177 lb 8 oz (80.5 kg)  07/08/19 186 lb 8.2 oz (84.6 kg)  05/11/19 187 lb (84.8 kg)  wt is down 9 lb since hospitalization  Appetite not as good as usual  33.54 kg/m   bp is stable today  No cp or palpitations or headaches or edema  No side effects to medicines  BP Readings from Last 3 Encounters:  07/13/19 132/72  07/08/19 120/60  05/11/19 (!) 151/71     Pulse Readings from Last 3 Encounters:  07/13/19 76  07/08/19 66  05/11/19 68   Pulse ox 96% today   Does not feel as sick as she was  Still feels like it it hard to breathe - she has a brief occ cough   Uses robitussin  Still feels awfully weak Home care is coming out with PT   Making effort to drink fluids   Blood glucose is still high at home  171 this am   Patient Active Problem List   Diagnosis Date Noted   Aortic atherosclerosis ([place]) 07/13/2019   Sepsis ([place]) 07/01/2019   CKD (chronic kidney disease) stage 4, GFR 15-29 ml/min ([place]) 07/01/2019   Lower abdominal pain 05/11/2019    Routine general medical examination at a health care facility 03/30/2019   External hemorrhoid 01/17/2018   History of colitis 01/17/2018   Generalized weakness 12/24/2016   Diabetic retinopathy ([place]) 12/11/2016   History of CVA (cerebrovascular accident) 12/11/2016   Community acquired pneumonia 10/07/2015   Estrogen deficiency 08/30/2015   Encounter for Medicare annual wellness exam 04/17/2013   Mobility impaired 06/26/2011   History of retinal detachment 01/10/2011   Sleep apnea 11/28/2010   Depression with anxiety 08/25/2010   Hemiplegia, late effect of cerebrovascular disease ([place]) 07/05/2010   POSTHERPETIC NEURALGIA 11/09/2009   Renal insufficiency 06/29/2008   Chronic back pain 01/26/2008   EDEMA 01/26/2008   B12 deficiency 01/10/2007   Type 2 diabetes, controlled, with retinopathy ([place]) 11/27/2006   HLD (hyperlipidemia) 11/27/2006   Essential hypertension 11/27/2006   FIBROCYSTIC BREAST DISEASE 11/27/2006   ROSACEA 11/27/2006   OSTEOARTHRITIS 11/27/2006   URINARY INCONTINENCE, MIXED 11/27/2006   Past Medical History:  Diagnosis Date   Angioedema    possibly from voltaren   Bronchopneumonia 12/11/2016   Degenerative disc disease    Diabetes mellitus    type II   Hyperlipidemia    Hypertension    LVH (left ventricular hypertrophy)    and atrial enlargement by echo in past with nl EF   Nasal pruritis  Osteoarthritis    Osteopenia    Renal insufficiency    Sleep apnea    Stroke ([place]) 05/2010   Small vessel sobcortical (in [place] trial) with Dr Leonie Man, residual L hemiparesis   Vitamin B 12 deficiency 04/08   Past Surgical History:  Procedure Laterality Date   ABDOMINAL HYSTERECTOMY     BSO-fibroids   APPENDECTOMY     BACK SURGERY     COLON SURGERY     due to punctured intestines   EYE SURGERY     cataract extraction   KNEE SURGERY     arthroscope   PARS PLANA VITRECTOMY  07/31/2011   Procedure: PARS PLANA  VITRECTOMY WITH 25 GAUGE;  Surgeon: [name], MD;  Location: [facility];  Service: Ophthalmology;  Laterality: Right;  REMOVAL OF SILICONE OIL AND LASER RIGHT EYE   RETINAL DETACHMENT SURGERY  02/18/11   times 2   SPINE SURGERY  08/09   spinal decompression surgery   Social History   Tobacco Use   Smoking status: Never Smoker   Smokeless tobacco: Never Used  Substance Use Topics   Alcohol use: No    Alcohol/week: 0.0 standard drinks   Drug use: No   Family History  Problem Relation Age of Onset   COPD Brother    Cancer Sister        brain tumor with hemmorhage   Heart disease Sister        CAD   Allergies  Allergen Reactions   Diclofenac Sodium Other (See Comments)    REACTION: angioedema   Pioglitazone Swelling   Current Outpatient Medications on File Prior to Visit  Medication Sig Dispense Refill   acetaminophen (TYLENOL) 500 MG tablet Take 1,000 mg by mouth at bedtime.      ALPRAZolam (XANAX) 0.5 MG tablet TAKE 1 TABLET BY MOUTH TWICE A DAY AS NEEDED FOR ANXIETY (Patient taking differently: Take 0.5 mg by mouth 2 (two) times daily as needed for anxiety. TAKE 1 TABLET BY MOUTH TWICE A DAY AS NEEDED FOR ANXIETY) 45 tablet 3   amLODipine (NORVASC) 10 MG tablet Take 1 tablet (10 mg total) by mouth daily. 90 tablet 3   aspirin 325 MG tablet Take 325 mg by mouth daily.      beta carotene w/minerals (OCUVITE) tablet Take 1 tablet by mouth 2 (two) times daily.      Blood Glucose Monitoring Suppl (ONE TOUCH ULTRA 2) w/Device KIT Check blood sugar once daily and as directed. Dx E11.9 1 each 0   calcium-vitamin D (OSCAL WITH D) 500-200 MG-UNIT per tablet Take 1 tablet by mouth daily.      Cholecalciferol (VITAMIN D) 1000 UNITS capsule Take 1,000 Units by mouth daily.      diphenoxylate-atropine (LOMOTIL) 2.5-0.025 MG tablet Take 1 tablet by mouth 2 (two) times daily as needed for diarrhea or loose stools. 20 tablet 0   docusate sodium (COLACE) 100 MG capsule Take  200 mg by mouth at bedtime.     ferrous sulfate 325 (65 FE) MG tablet Take 325 mg by mouth 2 (two) times daily.     gemfibrozil (LOPID) 600 MG tablet Take 1 tablet (600 mg total) by mouth daily. 90 tablet 3   glipiZIDE (GLUCOTROL XL) 2.5 MG 24 hr tablet Take 1 tablet (2.5 mg total) by mouth daily with breakfast. 90 tablet 3   guaiFENesin (ROBITUSSIN) 100 MG/5ML SOLN Take 5 mLs by mouth every 4 (four) hours as needed for cough or to loosen phlegm.  hydrALAZINE (APRESOLINE) 100 MG tablet Take 100 mg by mouth 3 (three) times daily.      metoprolol succinate (TOPROL-XL) 25 MG 24 hr tablet Take 0.5 tablets (12.5 mg total) by mouth 2 (two) times daily. 90 tablet 3   mirtazapine (REMERON) 15 MG tablet TAKE 1/2 TABLET BY MOUTH AT BEDTIME (Patient taking differently: Take 7.5 mg by mouth at bedtime. ) 45 tablet 3   Multiple Vitamin (MULTIVITAMIN) capsule Take 1 capsule by mouth daily.      OneTouch Delica Lancets 82M MISC 1 Stick by Other route daily. Check blood sugar once daily and as directed. Dx E11.9 100 each 1   ONETOUCH ULTRA test strip CHECK BLOOD SUGAR ONCE DAILY AND AS DIRECTED. DX E11.9 100 strip 1   Polyvinyl Alcohol-Povidone (REFRESH OP) Place 1 drop into both eyes daily as needed (dry eyes).     sertraline (ZOLOFT) 50 MG tablet Take 1 tablet (50 mg total) by mouth daily. 90 tablet 3   trandolapril (MAVIK) 4 MG tablet Take 1 tablet (4 mg total) by mouth daily. 90 tablet 3   vitamin B-12 (CYANOCOBALAMIN) 1000 MCG tablet Take 1,000 mcg by mouth daily.      No current facility-administered medications on file prior to visit.      Review of Systems  Constitutional: Negative for activity change, appetite change, fatigue, fever and unexpected weight change.  HENT: Negative for congestion, ear pain, rhinorrhea, sinus pressure and sore throat.   Eyes: Negative for pain, redness and visual disturbance.  Respiratory: Negative for cough, shortness of breath and wheezing.        Very  slightly sob on exertion- thinks this may be from deconditioning  Seldom coughs s/p CAP   Cardiovascular: Negative for chest pain and palpitations.  Gastrointestinal: Negative for abdominal pain, blood in stool, constipation and diarrhea.  Endocrine: Negative for polydipsia and polyuria.  Genitourinary: Negative for dysuria, frequency and urgency.  Musculoskeletal: Negative for arthralgias, back pain and myalgias.  Skin: Negative for pallor and rash.  Allergic/Immunologic: Negative for environmental allergies.  Neurological: Positive for weakness. Negative for dizziness, syncope and headaches.  Hematological: Negative for adenopathy. Does not bruise/bleed easily.  Psychiatric/Behavioral: Negative for decreased concentration and dysphoric mood. The patient is not nervous/anxious.        Objective:   Physical Exam Constitutional:      General: She is not in acute distress.    Appearance: Normal appearance. She is well-developed. She is obese. She is not ill-appearing or diaphoretic.  HENT:     Head: Normocephalic and atraumatic.     Nose: Nose normal.     Mouth/Throat:     Mouth: Mucous membranes are moist.  Eyes:     General: No scleral icterus.       Right eye: No discharge.        Left eye: No discharge.     Extraocular Movements: Extraocular movements intact.     Conjunctiva/sclera: Conjunctivae normal.     Pupils: Pupils are equal, round, and reactive to light.  Neck:     Musculoskeletal: Normal range of motion and neck supple. No muscular tenderness.     Thyroid: No thyromegaly.     Vascular: No carotid bruit or JVD.  Cardiovascular:     Rate and Rhythm: Normal rate and regular rhythm.     Pulses: Normal pulses.     Heart sounds: Normal heart sounds. No gallop.   Pulmonary:     Effort: Pulmonary effort is normal. No respiratory  distress.     Breath sounds: Normal breath sounds. No stridor. No wheezing, rhonchi or rales.     Comments: Good air exch Some upper airway  sounds cleared by clearing her throat  No rales/rhonchi or wheeze Chest:     Chest wall: No tenderness.  Abdominal:     General: Bowel sounds are normal. There is no distension or abdominal bruit.     Palpations: Abdomen is soft. There is no mass.     Tenderness: There is no abdominal tenderness. There is no guarding or rebound.     Comments: Exam done sitting No suprapubic tenderness or fullness   No cva tenderness   Musculoskeletal:        General: No tenderness.     Right lower leg: No edema.     Left lower leg: No edema.  Lymphadenopathy:     Cervical: No cervical adenopathy.  Skin:    General: Skin is warm and dry.     Coloration: Skin is not pale.     Findings: No erythema or rash.  Neurological:     Mental Status: She is alert. Mental status is at baseline.     Cranial Nerves: No cranial nerve deficit.     Motor: Weakness present.     Gait: Gait normal.     Deep Tendon Reflexes: Reflexes are normal and symmetric.     Comments: Generalized weakness in addn to baseline hemiparesis  Able to walk with walker slowly  Psychiatric:        Mood and Affect: Mood normal.        Cognition and Memory: Cognition and memory normal.     Comments: Mood is ok  Seems fatigued but in fair spirits   Neighbor present- helps with hx and mobility           Assessment & Plan:   Problem List Items Addressed This Visit      Cardiovascular and Mediastinum   Essential hypertension    bp in fair control at this time  BP Readings from Last 1 Encounters:  07/13/19 132/72   No changes needed Most recent labs reviewed  Disc lifstyle change with low sodium diet and exercise        Aortic atherosclerosis ([place])    Incidental finding on her CT  No symptoms  bp and DM are well controlled  Takes gemfibrozil and apprehensive to add statin due to side eff poss        Respiratory   Community acquired pneumonia - Primary    Clinically improved Reviewed hospital records, lab results  and studies in detail  Overall much improved s/p abx (was septic due to this and uti)  occ cough /rare  And some sob  covid neg testing       Relevant Medications   albuterol (VENTOLIN HFA) 108 (90 Base) MCG/ACT inhaler     Endocrine   Type 2 diabetes, controlled, with retinopathy ([place])    Lab Results  Component Value Date   HGBA1C 6.0 03/30/2019   Glucose levels are up s/p recent infection as expected = pt is watching this  This should improve         Genitourinary   CKD (chronic kidney disease) stage 4, GFR 15-29 ml/min ([place])    Re check today s/p hosp for sepsis incl uti  Now drinking fluids and clinically improved       Relevant Orders   Renal function panel     Other  Mobility impaired    Continues home PT for this and also recent deconditioning from hospitalization        H/O sepsis    S/p sepsis thought to be due to CAP and uti  Now resolved after tx with abx  Reviewed hospital records, lab results and studies in detail

## 2019-07-13 NOTE — Assessment & Plan Note (Signed)
Re check today s/p hosp for sepsis incl uti  Now drinking fluids and clinically improved

## 2019-07-13 NOTE — Assessment & Plan Note (Signed)
Lab Results  Component Value Date   HGBA1C 6.0 03/30/2019   Glucose levels are up s/p recent infection as expected = pt is watching this  This should improve

## 2019-07-13 NOTE — Assessment & Plan Note (Signed)
Clinically improved Reviewed hospital records, lab results and studies in detail  Overall much improved s/p abx (was septic due to this and uti)  occ cough /rare  And some sob  covid neg testing

## 2019-07-13 NOTE — Assessment & Plan Note (Signed)
Continues home PT for this and also recent deconditioning from hospitalization

## 2019-07-14 ENCOUNTER — Encounter: Payer: Self-pay | Admitting: *Deleted

## 2019-07-17 DIAGNOSIS — I2510 Atherosclerotic heart disease of native coronary artery without angina pectoris: Secondary | ICD-10-CM | POA: Diagnosis not present

## 2019-07-17 DIAGNOSIS — E11319 Type 2 diabetes mellitus with unspecified diabetic retinopathy without macular edema: Secondary | ICD-10-CM | POA: Diagnosis not present

## 2019-07-17 DIAGNOSIS — M519 Unspecified thoracic, thoracolumbar and lumbosacral intervertebral disc disorder: Secondary | ICD-10-CM | POA: Diagnosis not present

## 2019-07-17 DIAGNOSIS — E1122 Type 2 diabetes mellitus with diabetic chronic kidney disease: Secondary | ICD-10-CM | POA: Diagnosis not present

## 2019-07-17 DIAGNOSIS — M1990 Unspecified osteoarthritis, unspecified site: Secondary | ICD-10-CM | POA: Diagnosis not present

## 2019-07-17 DIAGNOSIS — I69354 Hemiplegia and hemiparesis following cerebral infarction affecting left non-dominant side: Secondary | ICD-10-CM | POA: Diagnosis not present

## 2019-07-17 DIAGNOSIS — N184 Chronic kidney disease, stage 4 (severe): Secondary | ICD-10-CM | POA: Diagnosis not present

## 2019-07-17 DIAGNOSIS — G8929 Other chronic pain: Secondary | ICD-10-CM | POA: Diagnosis not present

## 2019-07-17 DIAGNOSIS — I129 Hypertensive chronic kidney disease with stage 1 through stage 4 chronic kidney disease, or unspecified chronic kidney disease: Secondary | ICD-10-CM | POA: Diagnosis not present

## 2019-07-17 DIAGNOSIS — I700 Atherosclerosis of aorta: Secondary | ICD-10-CM | POA: Diagnosis not present

## 2019-07-20 ENCOUNTER — Other Ambulatory Visit: Payer: Self-pay | Admitting: Family Medicine

## 2019-07-20 DIAGNOSIS — I69354 Hemiplegia and hemiparesis following cerebral infarction affecting left non-dominant side: Secondary | ICD-10-CM | POA: Diagnosis not present

## 2019-07-20 DIAGNOSIS — I700 Atherosclerosis of aorta: Secondary | ICD-10-CM | POA: Diagnosis not present

## 2019-07-20 DIAGNOSIS — G8929 Other chronic pain: Secondary | ICD-10-CM | POA: Diagnosis not present

## 2019-07-20 DIAGNOSIS — M519 Unspecified thoracic, thoracolumbar and lumbosacral intervertebral disc disorder: Secondary | ICD-10-CM | POA: Diagnosis not present

## 2019-07-20 DIAGNOSIS — I2510 Atherosclerotic heart disease of native coronary artery without angina pectoris: Secondary | ICD-10-CM | POA: Diagnosis not present

## 2019-07-20 DIAGNOSIS — M1990 Unspecified osteoarthritis, unspecified site: Secondary | ICD-10-CM | POA: Diagnosis not present

## 2019-07-20 DIAGNOSIS — E1122 Type 2 diabetes mellitus with diabetic chronic kidney disease: Secondary | ICD-10-CM | POA: Diagnosis not present

## 2019-07-20 DIAGNOSIS — I129 Hypertensive chronic kidney disease with stage 1 through stage 4 chronic kidney disease, or unspecified chronic kidney disease: Secondary | ICD-10-CM | POA: Diagnosis not present

## 2019-07-20 DIAGNOSIS — E11319 Type 2 diabetes mellitus with unspecified diabetic retinopathy without macular edema: Secondary | ICD-10-CM | POA: Diagnosis not present

## 2019-07-20 DIAGNOSIS — N184 Chronic kidney disease, stage 4 (severe): Secondary | ICD-10-CM | POA: Diagnosis not present

## 2019-07-22 DIAGNOSIS — I2510 Atherosclerotic heart disease of native coronary artery without angina pectoris: Secondary | ICD-10-CM | POA: Diagnosis not present

## 2019-07-22 DIAGNOSIS — I700 Atherosclerosis of aorta: Secondary | ICD-10-CM | POA: Diagnosis not present

## 2019-07-22 DIAGNOSIS — G8929 Other chronic pain: Secondary | ICD-10-CM | POA: Diagnosis not present

## 2019-07-22 DIAGNOSIS — E11319 Type 2 diabetes mellitus with unspecified diabetic retinopathy without macular edema: Secondary | ICD-10-CM | POA: Diagnosis not present

## 2019-07-22 DIAGNOSIS — N184 Chronic kidney disease, stage 4 (severe): Secondary | ICD-10-CM | POA: Diagnosis not present

## 2019-07-22 DIAGNOSIS — M519 Unspecified thoracic, thoracolumbar and lumbosacral intervertebral disc disorder: Secondary | ICD-10-CM | POA: Diagnosis not present

## 2019-07-22 DIAGNOSIS — M1990 Unspecified osteoarthritis, unspecified site: Secondary | ICD-10-CM | POA: Diagnosis not present

## 2019-07-22 DIAGNOSIS — I129 Hypertensive chronic kidney disease with stage 1 through stage 4 chronic kidney disease, or unspecified chronic kidney disease: Secondary | ICD-10-CM | POA: Diagnosis not present

## 2019-07-22 DIAGNOSIS — I69354 Hemiplegia and hemiparesis following cerebral infarction affecting left non-dominant side: Secondary | ICD-10-CM | POA: Diagnosis not present

## 2019-07-22 DIAGNOSIS — E1122 Type 2 diabetes mellitus with diabetic chronic kidney disease: Secondary | ICD-10-CM | POA: Diagnosis not present

## 2019-07-23 DIAGNOSIS — M519 Unspecified thoracic, thoracolumbar and lumbosacral intervertebral disc disorder: Secondary | ICD-10-CM | POA: Diagnosis not present

## 2019-07-23 DIAGNOSIS — M1990 Unspecified osteoarthritis, unspecified site: Secondary | ICD-10-CM | POA: Diagnosis not present

## 2019-07-23 DIAGNOSIS — G8929 Other chronic pain: Secondary | ICD-10-CM | POA: Diagnosis not present

## 2019-07-23 DIAGNOSIS — E1122 Type 2 diabetes mellitus with diabetic chronic kidney disease: Secondary | ICD-10-CM | POA: Diagnosis not present

## 2019-07-23 DIAGNOSIS — I129 Hypertensive chronic kidney disease with stage 1 through stage 4 chronic kidney disease, or unspecified chronic kidney disease: Secondary | ICD-10-CM | POA: Diagnosis not present

## 2019-07-23 DIAGNOSIS — N184 Chronic kidney disease, stage 4 (severe): Secondary | ICD-10-CM | POA: Diagnosis not present

## 2019-07-23 DIAGNOSIS — I69354 Hemiplegia and hemiparesis following cerebral infarction affecting left non-dominant side: Secondary | ICD-10-CM | POA: Diagnosis not present

## 2019-07-23 DIAGNOSIS — I700 Atherosclerosis of aorta: Secondary | ICD-10-CM | POA: Diagnosis not present

## 2019-07-23 DIAGNOSIS — E11319 Type 2 diabetes mellitus with unspecified diabetic retinopathy without macular edema: Secondary | ICD-10-CM | POA: Diagnosis not present

## 2019-07-23 DIAGNOSIS — I2510 Atherosclerotic heart disease of native coronary artery without angina pectoris: Secondary | ICD-10-CM | POA: Diagnosis not present

## 2019-07-27 ENCOUNTER — Other Ambulatory Visit: Payer: Self-pay | Admitting: *Deleted

## 2019-07-27 MED ORDER — ONETOUCH ULTRA VI STRP: ORAL_STRIP | 1 refills | Status: DC

## 2019-07-28 DIAGNOSIS — I129 Hypertensive chronic kidney disease with stage 1 through stage 4 chronic kidney disease, or unspecified chronic kidney disease: Secondary | ICD-10-CM | POA: Diagnosis not present

## 2019-07-28 DIAGNOSIS — M519 Unspecified thoracic, thoracolumbar and lumbosacral intervertebral disc disorder: Secondary | ICD-10-CM | POA: Diagnosis not present

## 2019-07-28 DIAGNOSIS — I69354 Hemiplegia and hemiparesis following cerebral infarction affecting left non-dominant side: Secondary | ICD-10-CM | POA: Diagnosis not present

## 2019-07-28 DIAGNOSIS — I700 Atherosclerosis of aorta: Secondary | ICD-10-CM | POA: Diagnosis not present

## 2019-07-28 DIAGNOSIS — I2510 Atherosclerotic heart disease of native coronary artery without angina pectoris: Secondary | ICD-10-CM | POA: Diagnosis not present

## 2019-07-28 DIAGNOSIS — G8929 Other chronic pain: Secondary | ICD-10-CM | POA: Diagnosis not present

## 2019-07-28 DIAGNOSIS — E11319 Type 2 diabetes mellitus with unspecified diabetic retinopathy without macular edema: Secondary | ICD-10-CM | POA: Diagnosis not present

## 2019-07-28 DIAGNOSIS — E1122 Type 2 diabetes mellitus with diabetic chronic kidney disease: Secondary | ICD-10-CM | POA: Diagnosis not present

## 2019-07-28 DIAGNOSIS — M1990 Unspecified osteoarthritis, unspecified site: Secondary | ICD-10-CM | POA: Diagnosis not present

## 2019-07-28 DIAGNOSIS — N184 Chronic kidney disease, stage 4 (severe): Secondary | ICD-10-CM | POA: Diagnosis not present

## 2019-07-29 DIAGNOSIS — I129 Hypertensive chronic kidney disease with stage 1 through stage 4 chronic kidney disease, or unspecified chronic kidney disease: Secondary | ICD-10-CM | POA: Diagnosis not present

## 2019-07-29 DIAGNOSIS — I2510 Atherosclerotic heart disease of native coronary artery without angina pectoris: Secondary | ICD-10-CM | POA: Diagnosis not present

## 2019-07-29 DIAGNOSIS — I700 Atherosclerosis of aorta: Secondary | ICD-10-CM | POA: Diagnosis not present

## 2019-07-29 DIAGNOSIS — G8929 Other chronic pain: Secondary | ICD-10-CM | POA: Diagnosis not present

## 2019-07-29 DIAGNOSIS — I69354 Hemiplegia and hemiparesis following cerebral infarction affecting left non-dominant side: Secondary | ICD-10-CM | POA: Diagnosis not present

## 2019-07-29 DIAGNOSIS — E11319 Type 2 diabetes mellitus with unspecified diabetic retinopathy without macular edema: Secondary | ICD-10-CM | POA: Diagnosis not present

## 2019-07-29 DIAGNOSIS — M1990 Unspecified osteoarthritis, unspecified site: Secondary | ICD-10-CM | POA: Diagnosis not present

## 2019-07-29 DIAGNOSIS — E1122 Type 2 diabetes mellitus with diabetic chronic kidney disease: Secondary | ICD-10-CM | POA: Diagnosis not present

## 2019-07-29 DIAGNOSIS — N184 Chronic kidney disease, stage 4 (severe): Secondary | ICD-10-CM | POA: Diagnosis not present

## 2019-07-29 DIAGNOSIS — M519 Unspecified thoracic, thoracolumbar and lumbosacral intervertebral disc disorder: Secondary | ICD-10-CM | POA: Diagnosis not present

## 2019-07-30 ENCOUNTER — Encounter: Payer: Self-pay | Admitting: Family Medicine

## 2019-07-30 DIAGNOSIS — Z1231 Encounter for screening mammogram for malignant neoplasm of breast: Secondary | ICD-10-CM | POA: Diagnosis not present

## 2019-07-31 DIAGNOSIS — G8929 Other chronic pain: Secondary | ICD-10-CM | POA: Diagnosis not present

## 2019-07-31 DIAGNOSIS — M1990 Unspecified osteoarthritis, unspecified site: Secondary | ICD-10-CM | POA: Diagnosis not present

## 2019-07-31 DIAGNOSIS — I2510 Atherosclerotic heart disease of native coronary artery without angina pectoris: Secondary | ICD-10-CM | POA: Diagnosis not present

## 2019-07-31 DIAGNOSIS — M519 Unspecified thoracic, thoracolumbar and lumbosacral intervertebral disc disorder: Secondary | ICD-10-CM | POA: Diagnosis not present

## 2019-07-31 DIAGNOSIS — I69354 Hemiplegia and hemiparesis following cerebral infarction affecting left non-dominant side: Secondary | ICD-10-CM | POA: Diagnosis not present

## 2019-07-31 DIAGNOSIS — N184 Chronic kidney disease, stage 4 (severe): Secondary | ICD-10-CM | POA: Diagnosis not present

## 2019-07-31 DIAGNOSIS — E11319 Type 2 diabetes mellitus with unspecified diabetic retinopathy without macular edema: Secondary | ICD-10-CM | POA: Diagnosis not present

## 2019-07-31 DIAGNOSIS — I700 Atherosclerosis of aorta: Secondary | ICD-10-CM | POA: Diagnosis not present

## 2019-07-31 DIAGNOSIS — I129 Hypertensive chronic kidney disease with stage 1 through stage 4 chronic kidney disease, or unspecified chronic kidney disease: Secondary | ICD-10-CM | POA: Diagnosis not present

## 2019-07-31 DIAGNOSIS — E1122 Type 2 diabetes mellitus with diabetic chronic kidney disease: Secondary | ICD-10-CM | POA: Diagnosis not present

## 2019-08-03 DIAGNOSIS — M1990 Unspecified osteoarthritis, unspecified site: Secondary | ICD-10-CM | POA: Diagnosis not present

## 2019-08-03 DIAGNOSIS — M519 Unspecified thoracic, thoracolumbar and lumbosacral intervertebral disc disorder: Secondary | ICD-10-CM | POA: Diagnosis not present

## 2019-08-03 DIAGNOSIS — E11319 Type 2 diabetes mellitus with unspecified diabetic retinopathy without macular edema: Secondary | ICD-10-CM | POA: Diagnosis not present

## 2019-08-03 DIAGNOSIS — G8929 Other chronic pain: Secondary | ICD-10-CM | POA: Diagnosis not present

## 2019-08-03 DIAGNOSIS — I700 Atherosclerosis of aorta: Secondary | ICD-10-CM | POA: Diagnosis not present

## 2019-08-03 DIAGNOSIS — I129 Hypertensive chronic kidney disease with stage 1 through stage 4 chronic kidney disease, or unspecified chronic kidney disease: Secondary | ICD-10-CM | POA: Diagnosis not present

## 2019-08-03 DIAGNOSIS — I69354 Hemiplegia and hemiparesis following cerebral infarction affecting left non-dominant side: Secondary | ICD-10-CM | POA: Diagnosis not present

## 2019-08-03 DIAGNOSIS — N184 Chronic kidney disease, stage 4 (severe): Secondary | ICD-10-CM | POA: Diagnosis not present

## 2019-08-03 DIAGNOSIS — I2510 Atherosclerotic heart disease of native coronary artery without angina pectoris: Secondary | ICD-10-CM | POA: Diagnosis not present

## 2019-08-03 DIAGNOSIS — E1122 Type 2 diabetes mellitus with diabetic chronic kidney disease: Secondary | ICD-10-CM | POA: Diagnosis not present

## 2019-08-04 ENCOUNTER — Other Ambulatory Visit: Payer: Self-pay | Admitting: Family Medicine

## 2019-08-04 NOTE — Telephone Encounter (Signed)
It looks like her last fill was on 07/20/19 (45 pills would be a 22 day supply) -it is a little early  Please ask if she is taking it more than bid or if she still has pills left Thanks

## 2019-08-04 NOTE — Telephone Encounter (Signed)
Name of Medication:Xanax Name of Pharmacy:[facility] or Written Date and Quantity:04/21/19 #45 tabs with 3 refills Last Office Visit and Type:Hospital f/u 07/13/19 Next Office Visit and Type:none scheduled  Last Controlled Substance Agreement Date:09/12/16 Last UDS:09/12/16

## 2019-08-05 DIAGNOSIS — E1122 Type 2 diabetes mellitus with diabetic chronic kidney disease: Secondary | ICD-10-CM | POA: Diagnosis not present

## 2019-08-05 DIAGNOSIS — I700 Atherosclerosis of aorta: Secondary | ICD-10-CM | POA: Diagnosis not present

## 2019-08-05 DIAGNOSIS — G8929 Other chronic pain: Secondary | ICD-10-CM | POA: Diagnosis not present

## 2019-08-05 DIAGNOSIS — I2510 Atherosclerotic heart disease of native coronary artery without angina pectoris: Secondary | ICD-10-CM | POA: Diagnosis not present

## 2019-08-05 DIAGNOSIS — I69354 Hemiplegia and hemiparesis following cerebral infarction affecting left non-dominant side: Secondary | ICD-10-CM | POA: Diagnosis not present

## 2019-08-05 DIAGNOSIS — M519 Unspecified thoracic, thoracolumbar and lumbosacral intervertebral disc disorder: Secondary | ICD-10-CM | POA: Diagnosis not present

## 2019-08-05 DIAGNOSIS — N184 Chronic kidney disease, stage 4 (severe): Secondary | ICD-10-CM | POA: Diagnosis not present

## 2019-08-05 DIAGNOSIS — I129 Hypertensive chronic kidney disease with stage 1 through stage 4 chronic kidney disease, or unspecified chronic kidney disease: Secondary | ICD-10-CM | POA: Diagnosis not present

## 2019-08-05 DIAGNOSIS — E11319 Type 2 diabetes mellitus with unspecified diabetic retinopathy without macular edema: Secondary | ICD-10-CM | POA: Diagnosis not present

## 2019-08-05 DIAGNOSIS — M1990 Unspecified osteoarthritis, unspecified site: Secondary | ICD-10-CM | POA: Diagnosis not present

## 2019-08-05 NOTE — Telephone Encounter (Signed)
Pt said she has about 2 weeks left of med. I did advise pt that since xanax is a controlled sub that we can't refill it that early and when she has about 3-4 days left of medication that's when she should request the refill. Pt verbalized understanding and will call back or call pharmacy when she needs xanax refilled. Rx declined and FYI to PCP

## 2019-08-05 NOTE — Telephone Encounter (Signed)
Left VM requesting pt to call the office back 

## 2019-08-06 ENCOUNTER — Telehealth: Payer: Self-pay | Admitting: Family Medicine

## 2019-08-06 DIAGNOSIS — I69354 Hemiplegia and hemiparesis following cerebral infarction affecting left non-dominant side: Secondary | ICD-10-CM | POA: Diagnosis not present

## 2019-08-06 DIAGNOSIS — I129 Hypertensive chronic kidney disease with stage 1 through stage 4 chronic kidney disease, or unspecified chronic kidney disease: Secondary | ICD-10-CM | POA: Diagnosis not present

## 2019-08-06 DIAGNOSIS — I700 Atherosclerosis of aorta: Secondary | ICD-10-CM | POA: Diagnosis not present

## 2019-08-06 DIAGNOSIS — E1122 Type 2 diabetes mellitus with diabetic chronic kidney disease: Secondary | ICD-10-CM | POA: Diagnosis not present

## 2019-08-06 DIAGNOSIS — N184 Chronic kidney disease, stage 4 (severe): Secondary | ICD-10-CM | POA: Diagnosis not present

## 2019-08-06 DIAGNOSIS — M519 Unspecified thoracic, thoracolumbar and lumbosacral intervertebral disc disorder: Secondary | ICD-10-CM | POA: Diagnosis not present

## 2019-08-06 DIAGNOSIS — I2510 Atherosclerotic heart disease of native coronary artery without angina pectoris: Secondary | ICD-10-CM | POA: Diagnosis not present

## 2019-08-06 DIAGNOSIS — G8929 Other chronic pain: Secondary | ICD-10-CM | POA: Diagnosis not present

## 2019-08-06 DIAGNOSIS — M1990 Unspecified osteoarthritis, unspecified site: Secondary | ICD-10-CM | POA: Diagnosis not present

## 2019-08-06 DIAGNOSIS — E11319 Type 2 diabetes mellitus with unspecified diabetic retinopathy without macular edema: Secondary | ICD-10-CM | POA: Diagnosis not present

## 2019-08-06 NOTE — Telephone Encounter (Signed)
Received a call from [facility] [phone]) Requesting an extension of HH PT, 1 x week for 4 weeks To continue working on balance. Pt is making good progress but is still having safety issues with walking - not at baseline yet.

## 2019-08-07 NOTE — Telephone Encounter (Signed)
Left detailed message on Kendra's voicemail with verbal order approval

## 2019-08-07 NOTE — Telephone Encounter (Signed)
Please ok those verbal orders  

## 2019-08-10 ENCOUNTER — Other Ambulatory Visit: Payer: Self-pay | Admitting: Family Medicine

## 2019-08-10 NOTE — Telephone Encounter (Signed)
Name of Medication:Xanax Name of Pharmacy:[facility] or Written Date and Quantity:04/21/19 #45tabs with 3refills Last Office Visit and Type:Hospital f/u 07/13/19 Next Office Visit and Type:none scheduled Last Controlled Substance Agreement Date:09/12/16 Last UDS:09/12/16  Patient called to check on this refill.

## 2019-08-11 DIAGNOSIS — R69 Illness, unspecified: Secondary | ICD-10-CM | POA: Diagnosis not present

## 2019-08-11 NOTE — Telephone Encounter (Signed)
Pt left v/m requesting anxiety med be filled today. I spoke with pt and advised that xanax was refilled on 08/10/19 to [facility]. [facility] for [facility] up. Nothing further needed.

## 2019-08-12 DIAGNOSIS — E1122 Type 2 diabetes mellitus with diabetic chronic kidney disease: Secondary | ICD-10-CM | POA: Diagnosis not present

## 2019-08-12 DIAGNOSIS — I69354 Hemiplegia and hemiparesis following cerebral infarction affecting left non-dominant side: Secondary | ICD-10-CM | POA: Diagnosis not present

## 2019-08-12 DIAGNOSIS — I700 Atherosclerosis of aorta: Secondary | ICD-10-CM | POA: Diagnosis not present

## 2019-08-12 DIAGNOSIS — I129 Hypertensive chronic kidney disease with stage 1 through stage 4 chronic kidney disease, or unspecified chronic kidney disease: Secondary | ICD-10-CM | POA: Diagnosis not present

## 2019-08-12 DIAGNOSIS — N184 Chronic kidney disease, stage 4 (severe): Secondary | ICD-10-CM | POA: Diagnosis not present

## 2019-08-12 DIAGNOSIS — E11319 Type 2 diabetes mellitus with unspecified diabetic retinopathy without macular edema: Secondary | ICD-10-CM | POA: Diagnosis not present

## 2019-08-12 DIAGNOSIS — M1990 Unspecified osteoarthritis, unspecified site: Secondary | ICD-10-CM | POA: Diagnosis not present

## 2019-08-12 DIAGNOSIS — I2510 Atherosclerotic heart disease of native coronary artery without angina pectoris: Secondary | ICD-10-CM | POA: Diagnosis not present

## 2019-08-12 DIAGNOSIS — G8929 Other chronic pain: Secondary | ICD-10-CM | POA: Diagnosis not present

## 2019-08-12 DIAGNOSIS — M519 Unspecified thoracic, thoracolumbar and lumbosacral intervertebral disc disorder: Secondary | ICD-10-CM | POA: Diagnosis not present

## 2019-08-21 DIAGNOSIS — M519 Unspecified thoracic, thoracolumbar and lumbosacral intervertebral disc disorder: Secondary | ICD-10-CM | POA: Diagnosis not present

## 2019-08-21 DIAGNOSIS — G8929 Other chronic pain: Secondary | ICD-10-CM | POA: Diagnosis not present

## 2019-08-21 DIAGNOSIS — N184 Chronic kidney disease, stage 4 (severe): Secondary | ICD-10-CM | POA: Diagnosis not present

## 2019-08-21 DIAGNOSIS — E1122 Type 2 diabetes mellitus with diabetic chronic kidney disease: Secondary | ICD-10-CM | POA: Diagnosis not present

## 2019-08-21 DIAGNOSIS — E11319 Type 2 diabetes mellitus with unspecified diabetic retinopathy without macular edema: Secondary | ICD-10-CM | POA: Diagnosis not present

## 2019-08-21 DIAGNOSIS — I700 Atherosclerosis of aorta: Secondary | ICD-10-CM | POA: Diagnosis not present

## 2019-08-21 DIAGNOSIS — I2510 Atherosclerotic heart disease of native coronary artery without angina pectoris: Secondary | ICD-10-CM | POA: Diagnosis not present

## 2019-08-21 DIAGNOSIS — I129 Hypertensive chronic kidney disease with stage 1 through stage 4 chronic kidney disease, or unspecified chronic kidney disease: Secondary | ICD-10-CM | POA: Diagnosis not present

## 2019-08-21 DIAGNOSIS — M1990 Unspecified osteoarthritis, unspecified site: Secondary | ICD-10-CM | POA: Diagnosis not present

## 2019-08-21 DIAGNOSIS — I69354 Hemiplegia and hemiparesis following cerebral infarction affecting left non-dominant side: Secondary | ICD-10-CM | POA: Diagnosis not present

## 2019-08-25 DIAGNOSIS — G8929 Other chronic pain: Secondary | ICD-10-CM | POA: Diagnosis not present

## 2019-08-25 DIAGNOSIS — N184 Chronic kidney disease, stage 4 (severe): Secondary | ICD-10-CM | POA: Diagnosis not present

## 2019-08-25 DIAGNOSIS — E1122 Type 2 diabetes mellitus with diabetic chronic kidney disease: Secondary | ICD-10-CM | POA: Diagnosis not present

## 2019-08-25 DIAGNOSIS — I129 Hypertensive chronic kidney disease with stage 1 through stage 4 chronic kidney disease, or unspecified chronic kidney disease: Secondary | ICD-10-CM | POA: Diagnosis not present

## 2019-08-25 DIAGNOSIS — M519 Unspecified thoracic, thoracolumbar and lumbosacral intervertebral disc disorder: Secondary | ICD-10-CM | POA: Diagnosis not present

## 2019-08-25 DIAGNOSIS — I700 Atherosclerosis of aorta: Secondary | ICD-10-CM | POA: Diagnosis not present

## 2019-08-25 DIAGNOSIS — M1990 Unspecified osteoarthritis, unspecified site: Secondary | ICD-10-CM | POA: Diagnosis not present

## 2019-08-25 DIAGNOSIS — I2510 Atherosclerotic heart disease of native coronary artery without angina pectoris: Secondary | ICD-10-CM | POA: Diagnosis not present

## 2019-08-25 DIAGNOSIS — E11319 Type 2 diabetes mellitus with unspecified diabetic retinopathy without macular edema: Secondary | ICD-10-CM | POA: Diagnosis not present

## 2019-08-25 DIAGNOSIS — I69354 Hemiplegia and hemiparesis following cerebral infarction affecting left non-dominant side: Secondary | ICD-10-CM | POA: Diagnosis not present

## 2019-09-03 DIAGNOSIS — I69354 Hemiplegia and hemiparesis following cerebral infarction affecting left non-dominant side: Secondary | ICD-10-CM | POA: Diagnosis not present

## 2019-09-03 DIAGNOSIS — E11319 Type 2 diabetes mellitus with unspecified diabetic retinopathy without macular edema: Secondary | ICD-10-CM | POA: Diagnosis not present

## 2019-09-03 DIAGNOSIS — N184 Chronic kidney disease, stage 4 (severe): Secondary | ICD-10-CM | POA: Diagnosis not present

## 2019-09-03 DIAGNOSIS — M1990 Unspecified osteoarthritis, unspecified site: Secondary | ICD-10-CM | POA: Diagnosis not present

## 2019-09-03 DIAGNOSIS — G8929 Other chronic pain: Secondary | ICD-10-CM | POA: Diagnosis not present

## 2019-09-03 DIAGNOSIS — I129 Hypertensive chronic kidney disease with stage 1 through stage 4 chronic kidney disease, or unspecified chronic kidney disease: Secondary | ICD-10-CM | POA: Diagnosis not present

## 2019-09-03 DIAGNOSIS — I700 Atherosclerosis of aorta: Secondary | ICD-10-CM | POA: Diagnosis not present

## 2019-09-03 DIAGNOSIS — I2510 Atherosclerotic heart disease of native coronary artery without angina pectoris: Secondary | ICD-10-CM | POA: Diagnosis not present

## 2019-09-03 DIAGNOSIS — E1122 Type 2 diabetes mellitus with diabetic chronic kidney disease: Secondary | ICD-10-CM | POA: Diagnosis not present

## 2019-09-03 DIAGNOSIS — M519 Unspecified thoracic, thoracolumbar and lumbosacral intervertebral disc disorder: Secondary | ICD-10-CM | POA: Diagnosis not present

## 2019-09-07 ENCOUNTER — Other Ambulatory Visit: Payer: Self-pay | Admitting: Family Medicine

## 2019-09-08 NOTE — Telephone Encounter (Signed)
Last OV was on 07/13/19, both meds last filled on 09/30/18 #90 tabs with 0 refills

## 2019-09-25 ENCOUNTER — Telehealth: Payer: Self-pay | Admitting: Family Medicine

## 2019-09-25 NOTE — Telephone Encounter (Signed)
Pt advise that most medicare insurance only covers it at a pharmacy so she needs to call her insurance and see where they cover the vaccine at, if she gets it here she has to sign a waiver saying she will be responsible if it's not covered. Pt said she will check with her insurance and if it's covered at the pharmacy she will f/u with them

## 2019-09-25 NOTE — Telephone Encounter (Signed)
Pt called wanting to schedule shingles vaccine Ok to schedule??

## 2019-10-05 ENCOUNTER — Telehealth: Payer: Self-pay | Admitting: *Deleted

## 2019-10-05 NOTE — Telephone Encounter (Signed)
Agreed. Thanks.  Routed to PCP as FYI.

## 2019-10-05 NOTE — Telephone Encounter (Signed)
Patient called stating that she just found out that her nephew has tested positive for covid. Patient stated that his wife came to her home a week ago yesterday and dropped some food off for her. Patient stated that she and her nephew's wife were not wearing a mask. Patient stated that his wife just dropped the food off on the table beside her and left. Patient stated that his nephew's wife did not visit and was in and out quickly. Patient denies any covid symptoms. Advised patient that she should be fine since there was only a brief encounter. Patient was advised if she develops headache, sore throat, cough, fever, body aches, etc to let Dr. Glori Bickers know and she verbalized understanding. Patient was advised in the future If anyone comes to her house she should wear a mask and make sure that they do also. Dr. Glori Bickers is out of the office.

## 2019-10-16 ENCOUNTER — Ambulatory Visit: Payer: Medicare HMO | Attending: Internal Medicine

## 2019-10-16 DIAGNOSIS — Z23 Encounter for immunization: Secondary | ICD-10-CM | POA: Insufficient documentation

## 2019-10-16 NOTE — Progress Notes (Signed)
   Covid-19 Vaccination Clinic  Name:  SELICIA WINDOM    MRN: [id number] DOB: 1929/07/28  10/16/2019  Ms. Scheiber was observed post Covid-19 immunization for 15 minutes without incidence. She was provided with Vaccine Information Sheet and instruction to access the V-Safe system.   Ms. Boyan was instructed to call 911 with any severe reactions post vaccine: Marland Kitchen Difficulty breathing  . Swelling of your face and throat  . A fast heartbeat  . A bad rash all over your body  . Dizziness and weakness    Immunizations Administered    Name Date Dose VIS Date Route   Pfizer COVID-19 Vaccine 10/16/2019 12:45 PM 0.3 mL 07/31/2019 Intramuscular   Manufacturer: [place]   Lot: [id number]   [place]: [id number]

## 2019-10-22 DIAGNOSIS — R809 Proteinuria, unspecified: Secondary | ICD-10-CM | POA: Diagnosis not present

## 2019-10-22 DIAGNOSIS — N1832 Chronic kidney disease, stage 3b: Secondary | ICD-10-CM | POA: Diagnosis not present

## 2019-10-22 DIAGNOSIS — N2581 Secondary hyperparathyroidism of renal origin: Secondary | ICD-10-CM | POA: Diagnosis not present

## 2019-10-22 DIAGNOSIS — R300 Dysuria: Secondary | ICD-10-CM | POA: Diagnosis not present

## 2019-10-22 DIAGNOSIS — R600 Localized edema: Secondary | ICD-10-CM | POA: Diagnosis not present

## 2019-10-22 DIAGNOSIS — I10 Essential (primary) hypertension: Secondary | ICD-10-CM | POA: Diagnosis not present

## 2019-10-23 ENCOUNTER — Other Ambulatory Visit: Payer: Self-pay | Admitting: Family Medicine

## 2019-10-23 DIAGNOSIS — R69 Illness, unspecified: Secondary | ICD-10-CM | POA: Diagnosis not present

## 2019-10-30 ENCOUNTER — Emergency Department (HOSPITAL_COMMUNITY): Payer: Medicare HMO

## 2019-10-30 ENCOUNTER — Other Ambulatory Visit: Payer: Self-pay

## 2019-10-30 ENCOUNTER — Observation Stay (HOSPITAL_COMMUNITY)
Admission: EM | Admit: 2019-10-30 | Discharge: 2019-10-31 | Disposition: A | Discharge status: Home / Self Care | Payer: Medicare HMO | Attending: Internal Medicine | Admitting: Internal Medicine

## 2019-10-30 ENCOUNTER — Encounter (HOSPITAL_COMMUNITY): Payer: Self-pay | Admitting: Emergency Medicine

## 2019-10-30 ENCOUNTER — Telehealth: Payer: Self-pay

## 2019-10-30 DIAGNOSIS — I10 Essential (primary) hypertension: Secondary | ICD-10-CM

## 2019-10-30 DIAGNOSIS — Z20822 Contact with and (suspected) exposure to covid-19: Secondary | ICD-10-CM | POA: Insufficient documentation

## 2019-10-30 DIAGNOSIS — E785 Hyperlipidemia, unspecified: Secondary | ICD-10-CM | POA: Insufficient documentation

## 2019-10-30 DIAGNOSIS — Z8249 Family history of ischemic heart disease and other diseases of the circulatory system: Secondary | ICD-10-CM | POA: Insufficient documentation

## 2019-10-30 DIAGNOSIS — G4733 Obstructive sleep apnea (adult) (pediatric): Secondary | ICD-10-CM | POA: Diagnosis not present

## 2019-10-30 DIAGNOSIS — R509 Fever, unspecified: Secondary | ICD-10-CM | POA: Diagnosis not present

## 2019-10-30 DIAGNOSIS — Z7984 Long term (current) use of oral hypoglycemic drugs: Secondary | ICD-10-CM | POA: Diagnosis not present

## 2019-10-30 DIAGNOSIS — I69354 Hemiplegia and hemiparesis following cerebral infarction affecting left non-dominant side: Secondary | ICD-10-CM | POA: Diagnosis not present

## 2019-10-30 DIAGNOSIS — I129 Hypertensive chronic kidney disease with stage 1 through stage 4 chronic kidney disease, or unspecified chronic kidney disease: Secondary | ICD-10-CM | POA: Diagnosis not present

## 2019-10-30 DIAGNOSIS — Z6833 Body mass index (BMI) 33.0-33.9, adult: Secondary | ICD-10-CM | POA: Diagnosis not present

## 2019-10-30 DIAGNOSIS — Z808 Family history of malignant neoplasm of other organs or systems: Secondary | ICD-10-CM | POA: Diagnosis not present

## 2019-10-30 DIAGNOSIS — N3000 Acute cystitis without hematuria: Secondary | ICD-10-CM | POA: Diagnosis not present

## 2019-10-30 DIAGNOSIS — E11319 Type 2 diabetes mellitus with unspecified diabetic retinopathy without macular edema: Secondary | ICD-10-CM | POA: Diagnosis not present

## 2019-10-30 DIAGNOSIS — N184 Chronic kidney disease, stage 4 (severe): Secondary | ICD-10-CM | POA: Diagnosis not present

## 2019-10-30 DIAGNOSIS — M8580 Other specified disorders of bone density and structure, unspecified site: Secondary | ICD-10-CM | POA: Diagnosis not present

## 2019-10-30 DIAGNOSIS — E1122 Type 2 diabetes mellitus with diabetic chronic kidney disease: Secondary | ICD-10-CM | POA: Insufficient documentation

## 2019-10-30 DIAGNOSIS — Z8673 Personal history of transient ischemic attack (TIA), and cerebral infarction without residual deficits: Secondary | ICD-10-CM | POA: Diagnosis not present

## 2019-10-30 DIAGNOSIS — E669 Obesity, unspecified: Secondary | ICD-10-CM | POA: Insufficient documentation

## 2019-10-30 DIAGNOSIS — R52 Pain, unspecified: Secondary | ICD-10-CM | POA: Diagnosis not present

## 2019-10-30 DIAGNOSIS — F329 Major depressive disorder, single episode, unspecified: Secondary | ICD-10-CM | POA: Diagnosis not present

## 2019-10-30 DIAGNOSIS — R531 Weakness: Secondary | ICD-10-CM | POA: Diagnosis not present

## 2019-10-30 DIAGNOSIS — Z79899 Other long term (current) drug therapy: Secondary | ICD-10-CM | POA: Insufficient documentation

## 2019-10-30 DIAGNOSIS — Z888 Allergy status to other drugs, medicaments and biological substances status: Secondary | ICD-10-CM | POA: Insufficient documentation

## 2019-10-30 DIAGNOSIS — Z90710 Acquired absence of both cervix and uterus: Secondary | ICD-10-CM | POA: Insufficient documentation

## 2019-10-30 DIAGNOSIS — I700 Atherosclerosis of aorta: Secondary | ICD-10-CM | POA: Diagnosis not present

## 2019-10-30 DIAGNOSIS — Z825 Family history of asthma and other chronic lower respiratory diseases: Secondary | ICD-10-CM | POA: Diagnosis not present

## 2019-10-30 DIAGNOSIS — E1169 Type 2 diabetes mellitus with other specified complication: Secondary | ICD-10-CM | POA: Diagnosis not present

## 2019-10-30 DIAGNOSIS — F419 Anxiety disorder, unspecified: Secondary | ICD-10-CM | POA: Insufficient documentation

## 2019-10-30 DIAGNOSIS — M1990 Unspecified osteoarthritis, unspecified site: Secondary | ICD-10-CM | POA: Insufficient documentation

## 2019-10-30 DIAGNOSIS — R05 Cough: Secondary | ICD-10-CM | POA: Diagnosis not present

## 2019-10-30 DIAGNOSIS — N390 Urinary tract infection, site not specified: Principal | ICD-10-CM | POA: Insufficient documentation

## 2019-10-30 DIAGNOSIS — Z7982 Long term (current) use of aspirin: Secondary | ICD-10-CM | POA: Insufficient documentation

## 2019-10-30 LAB — COMPREHENSIVE METABOLIC PANEL
ALT: 14 U/L (ref 0–44)
AST: 17 U/L (ref 15–41)
Albumin: 3.2 g/dL — ABNORMAL LOW (ref 3.5–5.0)
Alkaline Phosphatase: 67 U/L (ref 38–126)
Anion gap: 11 (ref 5–15)
BUN: 19 mg/dL (ref 8–23)
CO2: 22 mmol/L (ref 22–32)
Calcium: 8.5 mg/dL — ABNORMAL LOW (ref 8.9–10.3)
Chloride: 107 mmol/L (ref 98–111)
Creatinine, Ser: 1.4 mg/dL — ABNORMAL HIGH (ref 0.44–1.00)
GFR calc Af Amer: 39 mL/min — ABNORMAL LOW (ref >60)
GFR calc non Af Amer: 33 mL/min — ABNORMAL LOW (ref >60)
Glucose, Bld: 187 mg/dL — ABNORMAL HIGH (ref 70–99)
Potassium: 4.1 mmol/L (ref 3.5–5.1)
Sodium: 140 mmol/L (ref 135–145)
Total Bilirubin: 0.7 mg/dL (ref 0.3–1.2)
Total Protein: 5 g/dL — ABNORMAL LOW (ref 6.5–8.1)

## 2019-10-30 LAB — URINALYSIS, ROUTINE W REFLEX MICROSCOPIC
Bilirubin Urine: NEGATIVE
Glucose, UA: NEGATIVE mg/dL
Hgb urine dipstick: NEGATIVE
Ketones, ur: NEGATIVE mg/dL
Leukocytes,Ua: NEGATIVE
Nitrite: NEGATIVE
Protein, ur: NEGATIVE mg/dL
Specific Gravity, Urine: 1.017 (ref 1.005–1.030)
pH: 5 (ref 5.0–8.0)

## 2019-10-30 LAB — CBC WITH DIFFERENTIAL/PLATELET
Abs Immature Granulocytes: 0.03 10*3/uL (ref 0.00–0.07)
Basophils Absolute: 0.1 10*3/uL (ref 0.0–0.1)
Basophils Relative: 1 %
Eosinophils Absolute: 0.1 10*3/uL (ref 0.0–0.5)
Eosinophils Relative: 2 %
HCT: 37.9 % (ref 36.0–46.0)
Hemoglobin: 11.9 g/dL — ABNORMAL LOW (ref 12.0–15.0)
Immature Granulocytes: 1 %
Lymphocytes Relative: 19 %
Lymphs Abs: 1.1 10*3/uL (ref 0.7–4.0)
MCH: 28.8 pg (ref 26.0–34.0)
MCHC: 31.4 g/dL (ref 30.0–36.0)
MCV: 91.8 fL (ref 80.0–100.0)
Monocytes Absolute: 0.4 10*3/uL (ref 0.1–1.0)
Monocytes Relative: 7 %
Neutro Abs: 3.9 10*3/uL (ref 1.7–7.7)
Neutrophils Relative %: 70 %
Platelets: 162 10*3/uL (ref 150–400)
RBC: 4.13 MIL/uL (ref 3.87–5.11)
RDW: 14 % (ref 11.5–15.5)
WBC: 5.6 10*3/uL (ref 4.0–10.5)
nRBC: 0 % (ref 0.0–0.2)

## 2019-10-30 LAB — GLUCOSE, CAPILLARY: Glucose-Capillary: 184 mg/dL — ABNORMAL HIGH (ref 70–99)

## 2019-10-30 LAB — LACTIC ACID, PLASMA: Lactic Acid, Venous: 1.5 mmol/L (ref 0.5–1.9)

## 2019-10-30 LAB — LIPASE, BLOOD: Lipase: 19 U/L (ref 11–51)

## 2019-10-30 MED ORDER — ALPRAZOLAM 0.5 MG PO TABS: 0.5000 mg | ORAL_TABLET | Freq: Two times a day (BID) | ORAL | Status: DC | PRN

## 2019-10-30 MED ORDER — INSULIN ASPART 100 UNIT/ML ~~LOC~~ SOLN: 0.0000 [IU] | Freq: Every day | SUBCUTANEOUS | Status: DC

## 2019-10-30 MED ORDER — DOCUSATE SODIUM 100 MG PO CAPS
100.0000 mg | ORAL_CAPSULE | Freq: Two times a day (BID) | ORAL | Status: DC
  Administered 2019-10-30 – 2019-10-31 (×2): 100 mg via ORAL
  Filled 2019-10-30 (×2): qty 1

## 2019-10-30 MED ORDER — ENOXAPARIN SODIUM 30 MG/0.3ML ~~LOC~~ SOLN: 30.0000 mg | SUBCUTANEOUS | Status: DC

## 2019-10-30 MED ORDER — INSULIN ASPART 100 UNIT/ML ~~LOC~~ SOLN
0.0000 [IU] | Freq: Three times a day (TID) | SUBCUTANEOUS | Status: DC
  Administered 2019-10-31: 2 [IU] via SUBCUTANEOUS
  Administered 2019-10-31: 3 [IU] via SUBCUTANEOUS

## 2019-10-30 MED ORDER — LACTATED RINGERS IV SOLN: INTRAVENOUS | Status: AC

## 2019-10-30 MED ORDER — ACETAMINOPHEN 325 MG PO TABS: 650.0000 mg | ORAL_TABLET | Freq: Four times a day (QID) | ORAL | Status: DC | PRN

## 2019-10-30 MED ORDER — HYDRALAZINE HCL 50 MG PO TABS
100.0000 mg | ORAL_TABLET | Freq: Three times a day (TID) | ORAL | Status: DC
  Administered 2019-10-30 – 2019-10-31 (×2): 100 mg via ORAL
  Filled 2019-10-30 (×4): qty 2

## 2019-10-30 MED ORDER — ALBUTEROL SULFATE (2.5 MG/3ML) 0.083% IN NEBU: 2.5000 mg | INHALATION_SOLUTION | RESPIRATORY_TRACT | Status: DC | PRN

## 2019-10-30 MED ORDER — ACETAMINOPHEN 650 MG RE SUPP: 650.0000 mg | Freq: Four times a day (QID) | RECTAL | Status: DC | PRN

## 2019-10-30 MED ORDER — MIRTAZAPINE 15 MG PO TABS
7.5000 mg | ORAL_TABLET | Freq: Every day | ORAL | Status: DC
  Administered 2019-10-30: 7.5 mg via ORAL
  Filled 2019-10-30 (×2): qty 1

## 2019-10-30 MED ORDER — ASPIRIN 325 MG PO TABS
325.0000 mg | ORAL_TABLET | Freq: Every day | ORAL | Status: DC
  Administered 2019-10-31: 325 mg via ORAL
  Filled 2019-10-30: qty 1

## 2019-10-30 MED ORDER — SERTRALINE HCL 50 MG PO TABS
50.0000 mg | ORAL_TABLET | Freq: Every day | ORAL | Status: DC
  Administered 2019-10-31: 50 mg via ORAL
  Filled 2019-10-30: qty 1

## 2019-10-30 MED ORDER — SODIUM CHLORIDE 0.9 % IV SOLN
2.0000 g | Freq: Once | INTRAVENOUS | Status: AC
  Administered 2019-10-30: 2 g via INTRAVENOUS
  Filled 2019-10-30: qty 2

## 2019-10-30 MED ORDER — AMLODIPINE BESYLATE 10 MG PO TABS
10.0000 mg | ORAL_TABLET | Freq: Every day | ORAL | Status: DC
  Administered 2019-10-31: 10 mg via ORAL
  Filled 2019-10-30: qty 1

## 2019-10-30 MED ORDER — METOPROLOL SUCCINATE ER 25 MG PO TB24
12.5000 mg | ORAL_TABLET | Freq: Two times a day (BID) | ORAL | Status: DC
  Administered 2019-10-30 – 2019-10-31 (×2): 12.5 mg via ORAL
  Filled 2019-10-30 (×2): qty 1

## 2019-10-30 MED ORDER — SODIUM CHLORIDE 0.9 % IV SOLN
1.0000 g | INTRAVENOUS | Status: DC
  Filled 2019-10-30: qty 1

## 2019-10-30 MED ORDER — ONDANSETRON HCL 4 MG PO TABS: 4.0000 mg | ORAL_TABLET | Freq: Four times a day (QID) | ORAL | Status: DC | PRN

## 2019-10-30 MED ORDER — ONDANSETRON HCL 4 MG/2ML IJ SOLN: 4.0000 mg | Freq: Four times a day (QID) | INTRAMUSCULAR | Status: DC | PRN

## 2019-10-30 NOTE — ED Provider Notes (Signed)
[facility] EMERGENCY DEPARTMENT Provider Note   CSN: [id number] Arrival date & time: 10/30/19  1249     History Chief Complaint  Patient presents with  . Urinary Tract Infection    Renee [name] is a 86 y.o. female.  The history is provided by the patient and medical records. No language interpreter was used.  Fever Max temp prior to arrival:  101 Temp source:  Oral Severity:  Moderate Onset quality:  Gradual Duration:  1 day Timing:  Constant Progression:  Waxing and waning Chronicity:  New Relieved by:  Nothing Worsened by:  Nothing Ineffective treatments:  None tried Associated symptoms: chills, congestion, cough, dysuria and nausea   Associated symptoms: no chest pain, no confusion, no diarrhea, no headaches, no rash, no rhinorrhea and no vomiting        Past Medical History:  Diagnosis Date  . Angioedema    possibly from voltaren  . Bronchopneumonia 12/11/2016  . Degenerative disc disease   . Diabetes mellitus    type II  . Hyperlipidemia   . Hypertension   . LVH (left ventricular hypertrophy)    and atrial enlargement by echo in past with nl EF  . Nasal pruritis   . Osteoarthritis   . Osteopenia   . Renal insufficiency   . Sleep apnea   . Stroke [facility]) 05/2010   Small vessel sobcortical (in [place] trial) with Dr Leonie Man, residual L hemiparesis  . Vitamin B 12 deficiency 04/08    Patient Active Problem List   Diagnosis Date Noted  . Aortic atherosclerosis ([place]) 07/13/2019  . H/O sepsis 07/01/2019  . CKD (chronic kidney disease) stage 4, GFR 15-29 ml/min (HCC) 07/01/2019  . Lower abdominal pain 05/11/2019  . Routine general medical examination at a health care facility 03/30/2019  . External hemorrhoid 01/17/2018  . History of colitis 01/17/2018  . Generalized weakness 12/24/2016  . Diabetic retinopathy ([place]) 12/11/2016  . History of CVA (cerebrovascular accident) 12/11/2016  . Community acquired pneumonia 10/07/2015  .  Estrogen deficiency 08/30/2015  . Encounter for Medicare annual wellness exam 04/17/2013  . Mobility impaired 06/26/2011  . History of retinal detachment 01/10/2011  . Sleep apnea 11/28/2010  . Depression with anxiety 08/25/2010  . Hemiplegia, late effect of cerebrovascular disease ([place]) 07/05/2010  . POSTHERPETIC NEURALGIA 11/09/2009  . Renal insufficiency 06/29/2008  . Chronic back pain 01/26/2008  . EDEMA 01/26/2008  . B12 deficiency 01/10/2007  . Type 2 diabetes, controlled, with retinopathy ([place]) 11/27/2006  . HLD (hyperlipidemia) 11/27/2006  . Essential hypertension 11/27/2006  . FIBROCYSTIC BREAST DISEASE 11/27/2006  . ROSACEA 11/27/2006  . OSTEOARTHRITIS 11/27/2006  . URINARY INCONTINENCE, MIXED 11/27/2006    Past Surgical History:  Procedure Laterality Date  . ABDOMINAL HYSTERECTOMY     BSO-fibroids  . APPENDECTOMY    . BACK SURGERY    . COLON SURGERY     due to punctured intestines  . EYE SURGERY     cataract extraction  . KNEE SURGERY     arthroscope  . PARS PLANA VITRECTOMY  07/31/2011   Procedure: PARS PLANA VITRECTOMY WITH 25 GAUGE;  Surgeon: [name], MD;  Location: [facility];  Service: Ophthalmology;  Laterality: Right;  REMOVAL OF SILICONE OIL AND LASER RIGHT EYE  . RETINAL DETACHMENT SURGERY  02/18/11   times 2  . SPINE SURGERY  08/09   spinal decompression surgery     OB History   No obstetric history on file.     Family  History  Problem Relation Age of Onset  . COPD Brother   . Cancer Sister        brain tumor with hemmorhage  . Heart disease Sister        CAD    Social History   Tobacco Use  . Smoking status: Never Smoker  . Smokeless tobacco: Never Used  Substance Use Topics  . Alcohol use: No    Alcohol/week: 0.0 standard drinks  . Drug use: No    Home Medications Prior to Admission medications   Medication Sig Start Date End Date Taking? Authorizing Provider  acetaminophen (TYLENOL) 500 MG tablet Take 1,000 mg by mouth at  bedtime.     [provider]  albuterol (VENTOLIN HFA) 108 (90 Base) MCG/ACT inhaler Inhale 2 puffs into the lungs every 4 (four) hours as needed for wheezing or shortness of breath. 07/13/19   Tower, Wynelle Fanny, MD  ALPRAZolam [name]) 0.5 MG tablet TAKE 1 TABLET BY MOUTH TWICE A DAY AS NEEDED FOR ANXIETY 08/10/19   Tower, Wynelle Fanny, MD  amLODipine (NORVASC) 10 MG tablet Take 1 tablet (10 mg total) by mouth daily. 09/30/18   Tower, Wynelle Fanny, MD  aspirin 325 MG tablet Take 325 mg by mouth daily.     [provider]  beta carotene w/minerals (OCUVITE) tablet Take 1 tablet by mouth 2 (two) times daily.     [provider]  Blood Glucose Monitoring Suppl (ONE TOUCH ULTRA 2) w/Device KIT Check blood sugar once daily and as directed. Dx E11.9 08/27/18   Tower, Wynelle Fanny, MD  calcium-vitamin D (OSCAL WITH D) 500-200 MG-UNIT per tablet Take 1 tablet by mouth daily.     [provider]  Cholecalciferol (VITAMIN D) 1000 UNITS capsule Take 1,000 Units by mouth daily.     [provider]  diphenoxylate-atropine (LOMOTIL) 2.5-0.025 MG tablet Take 1 tablet by mouth 2 (two) times daily as needed for diarrhea or loose stools. 01/15/18   Tower, Wynelle Fanny, MD  docusate sodium (COLACE) 100 MG capsule Take 200 mg by mouth at bedtime.    [provider]  ferrous sulfate 325 (65 FE) MG tablet Take 325 mg by mouth 2 (two) times daily.    [provider]  gemfibrozil (LOPID) 600 MG tablet TAKE 1 TABLET BY MOUTH EVERY DAY 09/08/19   Tower, Roque Lias A, MD  glipiZIDE (GLUCOTROL XL) 2.5 MG 24 hr tablet TAKE 1 TABLET BY MOUTH EVERY DAY WITH BREAKFAST 07/20/19   Tower, Roque Lias A, MD  glucose blood (ONETOUCH ULTRA) test strip CHECK BLOOD SUGAR ONCE DAILY AND AS DIRECTED. DX E11.9 07/27/19   Tower, Wynelle Fanny, MD  guaiFENesin (ROBITUSSIN) 100 MG/5ML SOLN Take 5 mLs by mouth every 4 (four) hours as needed for cough or to loosen phlegm.    [provider]  hydrALAZINE (APRESOLINE) 100  MG tablet Take 100 mg by mouth 3 (three) times daily.  08/30/14   [provider]  metoprolol succinate (TOPROL-XL) 25 MG 24 hr tablet Take 0.5 tablets (12.5 mg total) by mouth 2 (two) times daily. 09/30/18   Tower, Wynelle Fanny, MD  mirtazapine (REMERON) 15 MG tablet TAKE 1/2 TABLET BY MOUTH AT BEDTIME Patient taking differently: Take 7.5 mg by mouth at bedtime.  06/16/19   Tower, Wynelle Fanny, MD  Multiple Vitamin (MULTIVITAMIN) capsule Take 1 capsule by mouth daily.     [provider]  OneTouch Delica Lancets 62G MISC CHECK BLOOD SUGAR ONCE DAILY AND AS DIRECTED. DX  E11.9 10/23/19   Tower, Wynelle Fanny, MD  Polyvinyl Alcohol-Povidone (REFRESH OP) Place 1 drop into both eyes daily as needed (dry eyes).    [provider]  sertraline (ZOLOFT) 50 MG tablet TAKE 1 TABLET BY MOUTH EVERY DAY 09/08/19   Tower, Wynelle Fanny, MD  trandolapril (MAVIK) 4 MG tablet Take 1 tablet (4 mg total) by mouth daily. 09/30/18   Tower, Wynelle Fanny, MD  vitamin B-12 (CYANOCOBALAMIN) 1000 MCG tablet Take 1,000 mcg by mouth daily.     [provider]    Allergies    Diclofenac sodium and Pioglitazone  Review of Systems   Review of Systems  Constitutional: Positive for chills, diaphoresis, fatigue and fever.  HENT: Positive for congestion. Negative for rhinorrhea.   Respiratory: Positive for cough. Negative for chest tightness, shortness of breath and wheezing.   Cardiovascular: Negative for chest pain, palpitations and leg swelling.  Gastrointestinal: Positive for nausea. Negative for abdominal pain, diarrhea and vomiting.  Genitourinary: Positive for dysuria. Negative for decreased urine volume, flank pain and frequency.  Musculoskeletal: Negative for back pain, neck pain and neck stiffness.  Skin: Negative for rash.  Neurological: Negative for light-headedness and headaches.  Psychiatric/Behavioral: Negative for agitation and confusion.  All other systems reviewed and are negative.   Physical  Exam Updated Vital Signs BP (!) 167/67   Pulse 71   Temp 99.9 F (37.7 C) (Oral)   Resp (!) 21   Wt 80.5 kg   SpO2 97%   BMI 33.53 kg/m   Physical Exam Vitals and nursing note reviewed.  Constitutional:      General: She is not in acute distress.    Appearance: She is well-developed. She is not ill-appearing, toxic-appearing or diaphoretic.  HENT:     Head: Normocephalic and atraumatic.     Right Ear: External ear normal.     Left Ear: External ear normal.     Nose: Nose normal.     Mouth/Throat:     Mouth: Mucous membranes are moist.     Pharynx: No oropharyngeal exudate or posterior oropharyngeal erythema.  Eyes:     Conjunctiva/sclera: Conjunctivae normal.     Pupils: Pupils are equal, round, and reactive to light.  Cardiovascular:     Rate and Rhythm: Normal rate.     Pulses: Normal pulses.     Heart sounds: No murmur.  Pulmonary:     Effort: No respiratory distress.     Breath sounds: No stridor. No wheezing, rhonchi or rales.  Chest:     Chest wall: No tenderness.  Abdominal:     General: There is no distension.     Tenderness: There is no abdominal tenderness. There is no right CVA tenderness, left CVA tenderness or rebound.  Musculoskeletal:        General: No tenderness.     Cervical back: Normal range of motion and neck supple. No tenderness.  Skin:    General: Skin is warm.     Coloration: Skin is not pale.     Findings: No erythema or rash.  Neurological:     General: No focal deficit present.     Mental Status: She is alert and oriented to person, place, and time.     Sensory: No sensory deficit.     Motor: No weakness or abnormal muscle tone.     Deep Tendon Reflexes: Reflexes are normal and symmetric.  Psychiatric:        Mood and Affect: Mood normal.  ED Results / Procedures / Treatments   Labs (all labs ordered are listed, but only abnormal results are displayed) Labs Reviewed  CBC WITH DIFFERENTIAL/PLATELET - Abnormal; Notable for the  following components:      Result Value   Hemoglobin 11.9 (*)    All other components within normal limits  COMPREHENSIVE METABOLIC PANEL - Abnormal; Notable for the following components:   Glucose, Bld 187 (*)    Creatinine, Ser 1.40 (*)    Calcium 8.5 (*)    Total Protein 5.0 (*)    Albumin 3.2 (*)    GFR calc non Af Amer 33 (*)    GFR calc Af Amer 39 (*)    All other components within normal limits  CULTURE, BLOOD (ROUTINE X 2)  CULTURE, BLOOD (ROUTINE X 2)  URINE CULTURE  LACTIC ACID, PLASMA  URINALYSIS, ROUTINE W REFLEX MICROSCOPIC  LIPASE, BLOOD  LACTIC ACID, PLASMA    EKG EKG Interpretation  Date/Time:  Friday October 30 2019 12:52:13 EST Ventricular Rate:  75 PR Interval:    QRS Duration: 115 QT Interval:  402 QTC Calculation: 452 R Axis:   -133 Text Interpretation: Sinus rhythm Nonspecific intraventricular conduction delay Low voltage, precordial leads Probable anterolateral infarct, old When compared to prior, less wandering baseline. No STEMI Confirmed by Antony Blackbird [phone]) on 10/30/2019 1:02:00 PM   Radiology DG Chest Portable 1 View  Result Date: 10/30/2019 CLINICAL DATA:  Fever, cough EXAM: PORTABLE CHEST 1 VIEW COMPARISON:  06/30/2019 FINDINGS: Cardiomegaly. Both lungs are clear. The visualized skeletal structures are unremarkable. IMPRESSION: Cardiomegaly without acute abnormality of the lungs in AP portable projection. Electronically Signed   By: Eddie Candle M.D.   On: 10/30/2019 13:50    Procedures Procedures (including critical care time)  Medications Ordered in ED Medications  ceFEPIme (MAXIPIME) 2 g in sodium chloride 0.9 % 100 mL IVPB (2 g Intravenous New Bag/Given 10/30/19 1556)    ED Course  I have reviewed the triage vital signs and the nursing notes.  Pertinent labs & imaging results that were available during my care of the patient were reviewed by me and considered in my medical decision making (see chart for details).    MDM  Rules/Calculators/A&P                      MARIALUIZA CAR is a 86 y.o. female with a past medical history significant for hypertension, hyperlipidemia, diabetes, CKD, sleep apnea, osteoarthritis, prior angioedema, stroke, and currently being treated for urinary tract infection with Levaquin who presents with fever, fatigue, nausea, lightheadedness, malaise, and continued dysuria.  Patient reports that for the last few days she has been having urinary symptoms and was seen by her PCP who placed her on antibiotics 2 days ago with Levaquin.  She reports she has been taking it as directed but today woke up diaphoretic, very fatigued, and was febrile with a fever of 101.  She reports that she had nausea but no vomiting.  She has been having general malaise.  She denies significant abdominal or flank pain.  She denies constipation or diarrhea.  She does report she had a mild cough today but denies chest pain or shortness of breath.  She called her PCP who told her to come to the emergency department for evaluation given the worsening evidence of infection despite the Levaquin.  On exam, lungs are clear and chest is nontender.  Abdomen is nontender.  CVA and flanks nontender.  Patient  moving all extremities.  Patient resting comfortably and temperature was 99.9 orally.  Clinically I do suspect that patient is having worsening UTI despite the antibiotics.  We will get labs to make sure she is not septic or having worsening UTI.  We will get chest x-ray given the cough she is reporting.  Given her age, worsening systemic infectious signs, and failing outpatient antibiotics on day 3 of oral Levaquin, patient may require IV antibiotics and admission.  Will wait for labs and reassessment to determine disposition.   2:59 PM Just spoke with pharmacy who reviewed the patient's previous cultures.  They do not feel there is a good oral antibiotic now the patient is failed Levaquin.  They recommended IV cefepime and  admission for further management.  Cefepime was given and patient will be admitted for UTI that is failing outpatient antibiotics with restrictions based on her previous cultures.   Final Clinical Impression(s) / ED Diagnoses Final diagnoses:  Acute cystitis without hematuria    Clinical Impression: 1. Acute cystitis without hematuria     Disposition: Admit  This note was prepared with assistance of Dragon voice recognition software. Occasional wrong-word or sound-a-like substitutions may have occurred due to the inherent limitations of voice recognition software.     Topaz Raglin, Gwenyth Allegra, MD 10/30/19 [phone]

## 2019-10-30 NOTE — ED Notes (Signed)
Dinner Tray Ordered @ [phone].

## 2019-10-30 NOTE — ED Notes (Signed)
x1 unsuccessful attempt to call report

## 2019-10-30 NOTE — Telephone Encounter (Signed)
Patient called stating that she saw Dr Holley Raring on 10/22/19 and was told she has UTI on 10/28/19 (nots in care everywhere) and was started on Levaquin 1 tablet daily for 5 days. Today is day 3 of antibiotics and last night patient could barely sleep, she felt hot/on fire. This morning temp was 101, b/p 177/92. She no longer feels hot or on fire but does have some weakness feeling, and after eating some grits this morning feels queasy on her stomach. No other symptoms. She had someone there with her that was helping her right now. She re checked her vitals and readings on re check now were b/p 189/93, pulse 95 and temp 101.   I spoke with Alma Friendly and per her advised patient to call Dr Holley Raring and let them know what is going on and that patient will most likely need to go to the hospital. Advised patient to check with Dr Holley Raring and unless they have a way of evaluating patient today she needs to then go to the hospital for evaluation. Patient verbalized understanding. Advised patient if she started feeling worse to call 911.

## 2019-10-30 NOTE — ED Notes (Signed)
Report given to [place], Therapist, sports. All questions answered

## 2019-10-30 NOTE — ED Notes (Signed)
Second Lactic Acid does not need to be collected per Dr. Lorin Mercy

## 2019-10-30 NOTE — Telephone Encounter (Signed)
Agree with that advisement, thanks

## 2019-10-30 NOTE — ED Triage Notes (Addendum)
Pt in from home via [facility] with c/o worsened symptoms of UTI. Was dx and placed on abx 2 days ago, but feels no better. Pt reports fever of 101 at home, denies any flank pain, a&ox4

## 2019-10-30 NOTE — ED Notes (Signed)
Pt transported to 6[address] RM 21 in NAD with all belongings by this RN. Pt alert, speaking in full sentences. VSS. Breathing easy, non-labored. Equal rise and fall of chest noted.

## 2019-10-30 NOTE — H&P (Signed)
History and Physical    Renee [name]:[id number] DOB: 05/29/1929 DOA: 10/30/2019  PCP: Abner Greenspan, MD Consultants:  Holley Raring - nephrology Patient coming from:  Home - lives with caretaker; NOK: Renee [name], [phone]  Chief Complaint:  Refractory UTI symptoms  HPI: Renee [name] is a 86 y.o. female with medical history significant of CVA with resultant left hemiparesis; OSA; HTN; HLD; stage 4 CKD: and DM presenting with refractory UTI symptoms.  She reports that she had a UTI; the doctor prescribed an antibiotic but her body was resistant.  Her PCP encouraged her to come to the ER instead.  She is having mild burning - but this has not improved since starting the antibiotic.  +fever to 101 this AM.  No pain, no n/v.   ED Course:   Failing outpatient therapy for UTI, pharmacy says there are no PO abx available. Urine culture sensitive to Levaquin but fever and malaise this AM.  UA ok.  Pharmacy recommends Cefepime.  Review of Systems: As per HPI; otherwise review of systems reviewed and negative.   Ambulatory Status:  Ambulates with a walker  Past Medical History:  Diagnosis Date  . Angioedema    possibly from voltaren  . Bronchopneumonia 12/11/2016  . Degenerative disc disease   . Diabetes mellitus    type II  . Hyperlipidemia   . Hypertension   . LVH (left ventricular hypertrophy)    and atrial enlargement by echo in past with nl EF  . Nasal pruritis   . Osteoarthritis   . Osteopenia   . Renal insufficiency   . Sleep apnea   . Stroke [facility]) 05/2010   Small vessel sobcortical (in [place] trial) with Dr Leonie Man, residual L hemiparesis  . Vitamin B 12 deficiency 04/08    Past Surgical History:  Procedure Laterality Date  . ABDOMINAL HYSTERECTOMY     BSO-fibroids  . APPENDECTOMY    . BACK SURGERY    . COLON SURGERY     due to punctured intestines  . EYE SURGERY     cataract extraction  . KNEE SURGERY     arthroscope  . PARS PLANA VITRECTOMY   07/31/2011   Procedure: PARS PLANA VITRECTOMY WITH 25 GAUGE;  Surgeon: [name], MD;  Location: [facility];  Service: Ophthalmology;  Laterality: Right;  REMOVAL OF SILICONE OIL AND LASER RIGHT EYE  . RETINAL DETACHMENT SURGERY  02/18/11   times 2  . SPINE SURGERY  08/09   spinal decompression surgery    Social History   Socioeconomic History  . Marital status: Widowed    Spouse name: Not on file  . Number of children: Not on file  . Years of education: Not on file  . Highest education level: Not on file  Occupational History  . Not on file  Tobacco Use  . Smoking status: Never Smoker  . Smokeless tobacco: Never Used  Substance and Sexual Activity  . Alcohol use: No    Alcohol/week: 0.0 standard drinks  . Drug use: No  . Sexual activity: Never  Other Topics Concern  . Not on file  Social History Narrative  . Not on file   Social Determinants of Health   Financial Resource Strain:   . Difficulty of Paying Living Expenses:   Food Insecurity:   . Worried About Charity fundraiser in the Last Year:   . Arboriculturist in the Last Year:   Transportation Needs:   . Lack  of Transportation (Medical):   Marland Kitchen Lack of Transportation (Non-Medical):   Physical Activity:   . Days of Exercise per Week:   . Minutes of Exercise per Session:   Stress:   . Feeling of Stress :   Social Connections:   . Frequency of Communication with Friends and Family:   . Frequency of Social Gatherings with Friends and Family:   . Attends Religious Services:   . Active Member of Clubs or Organizations:   . Attends Archivist Meetings:   Marland Kitchen Marital Status:   Intimate Partner Violence:   . Fear of Current or Ex-Partner:   . Emotionally Abused:   Marland Kitchen Physically Abused:   . Sexually Abused:     Allergies  Allergen Reactions  . Diclofenac Sodium Other (See Comments)    REACTION: angioedema  . Pioglitazone Swelling    Family History  Problem Relation Age of Onset  . COPD Brother   .  Cancer Sister        brain tumor with hemmorhage  . Heart disease Sister        CAD    Prior to Admission medications   Medication Sig Start Date End Date Taking? Authorizing Provider  acetaminophen (TYLENOL) 500 MG tablet Take 1,000 mg by mouth at bedtime.     [provider]  albuterol (VENTOLIN HFA) 108 (90 Base) MCG/ACT inhaler Inhale 2 puffs into the lungs every 4 (four) hours as needed for wheezing or shortness of breath. 07/13/19   Tower, Wynelle Fanny, MD  ALPRAZolam [name]) 0.5 MG tablet TAKE 1 TABLET BY MOUTH TWICE A DAY AS NEEDED FOR ANXIETY 08/10/19   Tower, Wynelle Fanny, MD  amLODipine (NORVASC) 10 MG tablet Take 1 tablet (10 mg total) by mouth daily. 09/30/18   Tower, Wynelle Fanny, MD  aspirin 325 MG tablet Take 325 mg by mouth daily.     [provider]  beta carotene w/minerals (OCUVITE) tablet Take 1 tablet by mouth 2 (two) times daily.     [provider]  Blood Glucose Monitoring Suppl (ONE TOUCH ULTRA 2) w/Device KIT Check blood sugar once daily and as directed. Dx E11.9 08/27/18   Tower, Wynelle Fanny, MD  calcium-vitamin D (OSCAL WITH D) 500-200 MG-UNIT per tablet Take 1 tablet by mouth daily.     [provider]  Cholecalciferol (VITAMIN D) 1000 UNITS capsule Take 1,000 Units by mouth daily.     [provider]  diphenoxylate-atropine (LOMOTIL) 2.5-0.025 MG tablet Take 1 tablet by mouth 2 (two) times daily as needed for diarrhea or loose stools. 01/15/18   Tower, Wynelle Fanny, MD  docusate sodium (COLACE) 100 MG capsule Take 200 mg by mouth at bedtime.    [provider]  ferrous sulfate 325 (65 FE) MG tablet Take 325 mg by mouth 2 (two) times daily.    [provider]  gemfibrozil (LOPID) 600 MG tablet TAKE 1 TABLET BY MOUTH EVERY DAY 09/08/19   Tower, Roque Lias A, MD  glipiZIDE (GLUCOTROL XL) 2.5 MG 24 hr tablet TAKE 1 TABLET BY MOUTH EVERY DAY WITH BREAKFAST 07/20/19   Tower, Roque Lias A, MD  glucose blood (ONETOUCH ULTRA) test strip CHECK BLOOD  SUGAR ONCE DAILY AND AS DIRECTED. DX E11.9 07/27/19   Tower, Wynelle Fanny, MD  guaiFENesin (ROBITUSSIN) 100 MG/5ML SOLN Take 5 mLs by mouth every 4 (four) hours as needed for cough or to loosen phlegm.    [provider]  hydrALAZINE (APRESOLINE) 100 MG tablet Take 100 mg  by mouth 3 (three) times daily.  08/30/14   [provider]  metoprolol succinate (TOPROL-XL) 25 MG 24 hr tablet Take 0.5 tablets (12.5 mg total) by mouth 2 (two) times daily. 09/30/18   Tower, Wynelle Fanny, MD  mirtazapine (REMERON) 15 MG tablet TAKE 1/2 TABLET BY MOUTH AT BEDTIME Patient taking differently: Take 7.5 mg by mouth at bedtime.  06/16/19   Tower, Wynelle Fanny, MD  Multiple Vitamin (MULTIVITAMIN) capsule Take 1 capsule by mouth daily.     [provider]  OneTouch Delica Lancets 78M MISC CHECK BLOOD SUGAR ONCE DAILY AND AS DIRECTED. DX E11.9 10/23/19   Tower, Wynelle Fanny, MD  Polyvinyl Alcohol-Povidone (REFRESH OP) Place 1 drop into both eyes daily as needed (dry eyes).    [provider]  sertraline (ZOLOFT) 50 MG tablet TAKE 1 TABLET BY MOUTH EVERY DAY 09/08/19   Tower, Wynelle Fanny, MD  trandolapril (MAVIK) 4 MG tablet Take 1 tablet (4 mg total) by mouth daily. 09/30/18   Tower, Wynelle Fanny, MD  vitamin B-12 (CYANOCOBALAMIN) 1000 MCG tablet Take 1,000 mcg by mouth daily.     [provider]    Physical Exam: Vitals:   10/30/19 1545 10/30/19 1600 10/30/19 1615 10/30/19 1645  BP: (!) 167/71 (!) 170/74 (!) 168/74 (!) 156/75  Pulse: 63 65 66   Resp: 17 (!) 21 (!) 21 20  Temp:      TempSrc:      SpO2: 98% 96% 97%   Weight:         . General:  Appears calm and comfortable and is NAD . Eyes:  PERRL, EOMI, normal lids, iris . ENT:  grossly normal hearing, lips & tongue, mmm . Neck:  no LAD, masses or thyromegaly . Cardiovascular:  RRR, no m/r/g. 1-2+ stable LE edema.  Marland Kitchen Respiratory:   CTA bilaterally with no wheezes/rales/rhonchi.  Normal respiratory effort. . Abdomen:  soft, NT, ND, NABS . Back:    no CVAT . Skin:  no rash or induration seen on limited exam . Musculoskeletal:  Mildly decreased tone L > R, no bony abnormality . Psychiatric:  grossly normal mood and affect, speech fluent and appropriate, AOx3 . Neurologic:  CN 2-12 grossly intact, moves all extremities in coordinated fashion    Radiological Exams on Admission: DG Chest Portable 1 View  Result Date: 10/30/2019 CLINICAL DATA:  Fever, cough EXAM: PORTABLE CHEST 1 VIEW COMPARISON:  06/30/2019 FINDINGS: Cardiomegaly. Both lungs are clear. The visualized skeletal structures are unremarkable. IMPRESSION: Cardiomegaly without acute abnormality of the lungs in AP portable projection. Electronically Signed   By: Eddie Candle M.D.   On: 10/30/2019 13:50    EKG: Independently reviewed.  NSR with rate 75; nonspecific ST changes with no evidence of acute ischemia   Labs on Admission: I have personally reviewed the available labs and imaging studies at the time of the admission.  Pertinent labs:   Glucose 187 BUN 19/Creatinine 1.40/GFR 33 - stable Albumin 3.2 Protein 5.0 Lactate 1.5 WBC 5.6 Hgb 11.9 UA WNL Urine culture + Klebsiella, Citrobacter - Cefepime; Rocephin; Cipro; Ertapenem; Gentamicin; Imipenem; Levaquin; Zosyn   Assessment/Plan Principal Problem:   Urinary tract infection Active Problems:   Essential hypertension   History of CVA (cerebrovascular accident)   CKD (chronic kidney disease) stage 4, GFR 15-29 ml/min (HCC)   Hyperlipidemia   Diabetes mellitus type 2 in obese [facility])   Refractory UTI -Patient was seen by nephrology on 3/4 with c/o mild intermittent dysuria -Urine culture  positive as above -Rx was called into her pharmacy on 3/10 and so she only had a couple of doses of antibiotics prior to today's visit -This may be a refractory UTI or may be not longer for meds to start working -She did have a fever this AM -Will observe overnight -If she failed fluoroquinolone therapy, then IV options  appear to be best -Could consider a 3rd or 4th generation oral cephalosporin as an outpatient -For now, cefepime needs renal dosing only once every 24 hours so tomorrow will be her second dose  HTN -Continue Norvasc, hydralazine, Toprol XL -Hold ACE  DM -Will check A1c -hold Glucotrol -Cover with moderate-scale SSI  Obesity -BMI 33.5 -Weight loss should be encouraged  H/o CVA -Continue ASA 325 mg daily   HLD -Hold Lopid due to limited inpatient utility  Stage 3b-4 CKD -Appears to be stable at this time -Hold ACE for now  Depression/anxiety -Continue Xanax, Remeron, Zoloft   Note: This patient has been tested and is negative for the novel coronavirus COVID-19.  DVT prophylaxis:   Lovenox  Code Status:  DNR - confirmed with patient Family Communication: None present; she did not request that I speak to her sister by telephone at the time of admission. Disposition Plan: She is anticipated to d/c to home without [facility] services once her UTI issues have been stabilized/resolved. Consults called: None Admission status: It is my clinical opinion that referral for OBSERVATION is reasonable and necessary in this patient based on the above information provided. The aforementioned taken together are felt to place the patient at high risk for further clinical deterioration. However it is anticipated that the patient may be medically stable for discharge from the hospital within 24 to 48 hours.    Karmen Bongo MD [facility]   How to contact the [facility] Attending or Consulting provider [place] or covering provider during after hours [place], for this patient?  1. Check the care team in [facility] and look for a) attending/consulting [facility] provider listed and b) the [facility] team listed 2. Log into [email or web address] and use [place]'s universal password to access. If you do not have the password, please contact the hospital operator. 3. Locate the [facility] provider you are looking for under [facility] and page to a number that you can be directly reached. 4. If you still have difficulty reaching the provider, please page the [facility] (Director on Call) for the Hospitalists listed on amion for assistance.   10/30/2019, 5:36 PM

## 2019-10-30 NOTE — Progress Notes (Addendum)
Renee [name] is a 86 y.o. female patient admitted from ED awake, alert - oriented  X 4 - no acute distress noted.  VSS - Blood pressure (!) 158/87, pulse 64, temperature 98 F (36.7 C), temperature source Oral, resp. rate 17, weight 80.5 kg, SpO2 100 %.    IV in place, occlusive dsg intact without redness.   Will cont to eval and treat per MD orders.  Vidal Schwalbe, RN 10/30/2019 2100

## 2019-10-30 NOTE — ED Notes (Signed)
Assumed care of pt. Pt alert, resting on cart in NAD. Pt eating dinner, tolerating PO. Breathing easy, non-labored. Will continue to monitor. Call light within reach

## 2019-10-31 DIAGNOSIS — N3000 Acute cystitis without hematuria: Secondary | ICD-10-CM

## 2019-10-31 LAB — CBC
HCT: 36.6 % (ref 36.0–46.0)
Hemoglobin: 11.3 g/dL — ABNORMAL LOW (ref 12.0–15.0)
MCH: 28.4 pg (ref 26.0–34.0)
MCHC: 30.9 g/dL (ref 30.0–36.0)
MCV: 92 fL (ref 80.0–100.0)
Platelets: 163 10*3/uL (ref 150–400)
RBC: 3.98 MIL/uL (ref 3.87–5.11)
RDW: 14.1 % (ref 11.5–15.5)
WBC: 5 10*3/uL (ref 4.0–10.5)
nRBC: 0 % (ref 0.0–0.2)

## 2019-10-31 LAB — BASIC METABOLIC PANEL
Anion gap: 8 (ref 5–15)
BUN: 19 mg/dL (ref 8–23)
CO2: 25 mmol/L (ref 22–32)
Calcium: 8.7 mg/dL — ABNORMAL LOW (ref 8.9–10.3)
Chloride: 107 mmol/L (ref 98–111)
Creatinine, Ser: 1.39 mg/dL — ABNORMAL HIGH (ref 0.44–1.00)
GFR calc Af Amer: 39 mL/min — ABNORMAL LOW (ref >60)
GFR calc non Af Amer: 34 mL/min — ABNORMAL LOW (ref >60)
Glucose, Bld: 165 mg/dL — ABNORMAL HIGH (ref 70–99)
Potassium: 4.1 mmol/L (ref 3.5–5.1)
Sodium: 140 mmol/L (ref 135–145)

## 2019-10-31 LAB — URINE CULTURE: Culture: <10000 — AB

## 2019-10-31 LAB — SARS CORONAVIRUS 2 (TAT 6-24 HRS): SARS Coronavirus 2: NEGATIVE

## 2019-10-31 LAB — GLUCOSE, CAPILLARY
Glucose-Capillary: 122 mg/dL — ABNORMAL HIGH (ref 70–99)
Glucose-Capillary: 168 mg/dL — ABNORMAL HIGH (ref 70–99)

## 2019-10-31 MED ORDER — TRANDOLAPRIL 4 MG PO TABS
4.0000 mg | ORAL_TABLET | Freq: Every day | ORAL | Status: DC
  Filled 2019-10-31: qty 1

## 2019-10-31 MED ORDER — SODIUM CHLORIDE 0.9 % IV SOLN
1.0000 g | INTRAVENOUS | Status: DC
  Administered 2019-10-31: 1 g via INTRAVENOUS
  Filled 2019-10-31: qty 1

## 2019-10-31 MED ORDER — OLOPATADINE HCL 0.1 % OP SOLN
1.0000 [drp] | Freq: Every day | OPHTHALMIC | Status: DC
  Administered 2019-10-31: 10:00:00 1 [drp] via OPHTHALMIC
  Filled 2019-10-31: qty 5

## 2019-10-31 MED ORDER — CEFUROXIME AXETIL 250 MG PO TABS: 250.0000 mg | ORAL_TABLET | Freq: Every day | ORAL | 0 refills | Status: AC

## 2019-10-31 MED ORDER — TRANDOLAPRIL 1 MG PO TABS
4.0000 mg | ORAL_TABLET | Freq: Every day | ORAL | Status: DC
  Administered 2019-10-31: 4 mg via ORAL
  Filled 2019-10-31: qty 4

## 2019-10-31 NOTE — Discharge Summary (Signed)
Physician Discharge Summary  Renee [name] TRR:[id number] DOB: 11/18/1928 DOA: 10/30/2019  PCP: Abner Greenspan, MD  Admit date: 10/30/2019 Discharge date: 10/31/2019  Admitted From: Home  discharge disposition: Home   Recommendations for Outpatient Follow-Up:   1. Urine culture negative in hospital 2. Continue to work on blood pressure control outpatient   Discharge Diagnosis:   Principal Problem:   Urinary tract infection Active Problems:   Essential hypertension   History of CVA (cerebrovascular accident)   CKD (chronic kidney disease) stage 4, GFR 15-29 ml/min (HCC)   Hyperlipidemia   Diabetes mellitus type 2 in obese [facility])    Discharge Condition: Improved.  Diet recommendation: Low sodium, heart healthy.  Carbohydrate-modified.  Wound care: None.  Code status: Full.   History of Present Illness:   Renee [name] is a 86 y.o. female with medical history significant of CVA with resultant left hemiparesis; OSA; HTN; HLD; stage 4 CKD: and DM presenting with refractory UTI symptoms.  She reports that she had a UTI; the doctor prescribed an antibiotic but her body was resistant.  Her PCP encouraged her to come to the ER instead.  She is having mild burning - but this has not improved since starting the antibiotic.  +fever to 101 this AM.  No pain, no n/v.   ED Course:   Failing outpatient therapy for UTI, pharmacy says there are no PO abx available. Urine culture sensitive to Levaquin but fever and malaise this AM.  UA ok.  Pharmacy recommends Cefepime.   Hospital Course by Problem:   Refractory UTI -Patient was seen by nephrology on 3/4 with c/o mild intermittent dysuria -Urine culture positive as above -Rx was called into her pharmacy on 3/10 and so she only had a couple of doses of antibiotics prior to today's visit -This may be a refractory UTI or may be not longer for meds to start working -Fever x1 outpatient -Has been given 2 doses of IV cefepime  and will switch to oral to complete course -Urine cultures here in the hospital showed no growth so not convinced it was a Levaquin failure  HTN -Continue Norvasc, hydralazine, Toprol XL  DM -Resume home meds  Obesity -Estimated body mass index is 33.53 kg/m as calculated from the following:   Height as of 07/13/19: _0  (1.549 m).   Weight as of this encounter: 80.5 kg.  H/o CVA -Continue ASA 325 mg daily   HLD -Hold Lopid due to limited inpatient utility  Stage 3b-4 CKD -Appears to be stable at this time  Depression/anxiety -Continue Xanax, Remeron, Zoloft    Medical Consultants:      Discharge Exam:   Vitals:   10/31/19 0457 10/31/19 1255  BP: (!) 183/63 (!) 158/64  Pulse: 65 74  Resp: 17 19  Temp: 97.7 F (36.5 C) 97.7 F (36.5 C)  SpO2: 99% 97%   Vitals:   10/30/19 2037 10/30/19 2053 10/31/19 0457 10/31/19 1255  BP: (!) 162/65 (!) 158/87 (!) 183/63 (!) 158/64  Pulse:  64 65 74  Resp: _1 Temp:  98 F (36.7 C) 97.7 F (36.5 C) 97.7 F (36.5 C)  TempSrc:  Oral Oral Oral  SpO2: 97% 100% 99% 97%  Weight:        General exam: Appears calm and comfortable.    The results of significant diagnostics from this hospitalization (including imaging, microbiology, ancillary and laboratory) are listed below for reference.  Procedures and Diagnostic Studies:   DG Chest Portable 1 View  Result Date: 10/30/2019 CLINICAL DATA:  Fever, cough EXAM: PORTABLE CHEST 1 VIEW COMPARISON:  06/30/2019 FINDINGS: Cardiomegaly. Both lungs are clear. The visualized skeletal structures are unremarkable. IMPRESSION: Cardiomegaly without acute abnormality of the lungs in AP portable projection. Electronically Signed   By: Eddie Candle M.D.   On: 10/30/2019 13:50     Labs:   Basic Metabolic Panel: Recent Labs  Lab 10/30/19 1355 10/31/19 0235  NA 140 140  K 4.1 4.1  CL 107 107  CO2 22 25  GLUCOSE 187* 165*  BUN 19 19  CREATININE 1.40* 1.39*   CALCIUM 8.5* 8.7*   GFR Estimated Creatinine Clearance: 26.9 mL/min (A) (by C-G formula based on SCr of 1.39 mg/dL (H)). Liver Function Tests: Recent Labs  Lab 10/30/19 1355  AST 17  ALT 14  ALKPHOS 67  BILITOT 0.7  PROT 5.0*  ALBUMIN 3.2*   Recent Labs  Lab 10/30/19 1355  LIPASE 19   No results for input(s): AMMONIA in the last 168 hours. Coagulation profile No results for input(s): INR, PROTIME in the last 168 hours.  CBC: Recent Labs  Lab 10/30/19 1355 10/31/19 0235  WBC 5.6 5.0  NEUTROABS 3.9  --   HGB 11.9* 11.3*  HCT 37.9 36.6  MCV 91.8 92.0  PLT 162 163   Cardiac Enzymes: No results for input(s): CKTOTAL, CKMB, CKMBINDEX, TROPONINI in the last 168 hours. BNP: Invalid input(s): POCBNP CBG: Recent Labs  Lab 10/30/19 2103 10/31/19 0734 10/31/19 1137  GLUCAP 184* 122* 168*   D-Dimer No results for input(s): DDIMER in the last 72 hours. Hgb A1c No results for input(s): HGBA1C in the last 72 hours. Lipid Profile No results for input(s): CHOL, HDL, LDLCALC, TRIG, CHOLHDL, LDLDIRECT in the last 72 hours. Thyroid function studies No results for input(s): TSH, T4TOTAL, T3FREE, THYROIDAB in the last 72 hours.  Invalid input(s): FREET3 Anemia work up No results for input(s): VITAMINB12, FOLATE, FERRITIN, TIBC, IRON, RETICCTPCT in the last 72 hours. Microbiology Recent Results (from the past 240 hour(s))  Blood culture (routine x 2)     Status: None (Preliminary result)   Collection Time: 10/30/19  2:00 PM   Specimen: BLOOD RIGHT HAND  Result Value Ref Range Status   Specimen Description BLOOD RIGHT HAND  Final   Special Requests   Final    BOTTLES DRAWN AEROBIC ONLY Blood Culture results may not be optimal due to an inadequate volume of blood received in culture bottles   Culture   Final    NO GROWTH < 24 HOURS Performed at [facility], [place] [address], [place], [place] [address]    Report Status PENDING  Incomplete  Blood culture (routine x  2)     Status: None (Preliminary result)   Collection Time: 10/30/19  2:17 PM   Specimen: BLOOD  Result Value Ref Range Status   Specimen Description BLOOD RIGHT ANTECUBITAL  Final   Special Requests   Final    BOTTLES DRAWN AEROBIC AND ANAEROBIC Blood Culture results may not be optimal due to an inadequate volume of blood received in culture bottles   Culture   Final    NO GROWTH < 24 HOURS Performed at [facility], [place] [address], [place], [place] [address]    Report Status PENDING  Incomplete  Urine culture     Status: Abnormal   Collection Time: 10/30/19  2:21 PM   Specimen:  Urine, Random  Result Value Ref Range Status   Specimen Description URINE, RANDOM  Final   Special Requests NONE  Final   Culture (A)  Final    <10,000 COLONIES/mL INSIGNIFICANT GROWTH Performed at [facility], [address] [place], [place] [address]    Report Status 10/31/2019 FINAL  Final  SARS CORONAVIRUS 2 (TAT 6-24 HRS) Nasopharyngeal Nasopharyngeal Swab     Status: None   Collection Time: 10/30/19  5:52 PM   Specimen: Nasopharyngeal Swab  Result Value Ref Range Status   SARS Coronavirus 2 NEGATIVE NEGATIVE Final    Comment: (NOTE) SARS-CoV-2 target nucleic acids are NOT DETECTED. The SARS-CoV-2 RNA is generally detectable in upper and lower respiratory specimens during the acute phase of infection. Negative results do not preclude SARS-CoV-2 infection, do not rule out co-infections with other pathogens, and should not be used as the sole basis for treatment or other patient management decisions. Negative results must be combined with clinical observations, patient history, and epidemiological information. The expected result is Negative. Fact Sheet for Patients: SugarRoll.be Fact Sheet for Healthcare Providers: https://www.woods-mathews.com/ This test is not yet approved or cleared by the [place] FDA and  has been authorized for  detection and/or diagnosis of SARS-CoV-2 by FDA under an Emergency Use Authorization (EUA). This EUA will remain  in effect (meaning this test can be used) for the duration of the COVID-19 declaration under Section 56 4(b)(1) of the Act, 21 U.S.C. section 360bbb-3(b)(1), unless the authorization is terminated or revoked sooner. Performed at [facility], [place] [address], [place], [place] [address]      Discharge Instructions:   Discharge Instructions    Diet - low sodium heart healthy   Complete by: As directed    Diet Carb Modified   Complete by: As directed    Increase activity slowly   Complete by: As directed      Allergies as of 10/31/2019      Reactions   Diclofenac Sodium Other (See Comments)   REACTION: angioedema   Pioglitazone Swelling      Medication List    STOP taking these medications   diphenoxylate-atropine 2.5-0.025 MG tablet Commonly known as: LOMOTIL   levofloxacin 500 MG tablet Commonly known as: LEVAQUIN     TAKE these medications   acetaminophen 500 MG tablet Commonly known as: TYLENOL Take 1,000 mg by mouth at bedtime.   albuterol 108 (90 Base) MCG/ACT inhaler Commonly known as: VENTOLIN HFA Inhale 2 puffs into the lungs every 4 (four) hours as needed for wheezing or shortness of breath.   ALPRAZolam 0.5 MG tablet Commonly known as: XANAX TAKE 1 TABLET BY MOUTH TWICE A DAY AS NEEDED FOR ANXIETY What changed: See the new instructions.   amLODipine 10 MG tablet Commonly known as: NORVASC Take 1 tablet (10 mg total) by mouth daily.   aspirin 325 MG tablet Take 325 mg by mouth daily.   calcium-vitamin D 500-200 MG-UNIT tablet Commonly known as: OSCAL WITH D Take 1 tablet by mouth daily.   cefUROXime 250 MG tablet Commonly known as: CEFTIN Take 1 tablet (250 mg total) by mouth daily for 2 days.   docusate sodium 100 MG capsule Commonly known as: COLACE Take 200 mg by mouth at bedtime.   ferrous sulfate 325 (65 FE) MG  tablet Take 325 mg by mouth 2 (two) times daily.   gemfibrozil 600 MG tablet Commonly known as: LOPID TAKE 1 TABLET BY MOUTH EVERY DAY  glipiZIDE 2.5 MG 24 hr tablet Commonly known as: GLUCOTROL XL TAKE 1 TABLET BY MOUTH EVERY DAY WITH BREAKFAST What changed: See the new instructions.   hydrALAZINE 100 MG tablet Commonly known as: APRESOLINE Take 100 mg by mouth 3 (three) times daily.   metoprolol succinate 25 MG 24 hr tablet Commonly known as: TOPROL-XL Take 0.5 tablets (12.5 mg total) by mouth 2 (two) times daily.   mirtazapine 15 MG tablet Commonly known as: REMERON TAKE 1/2 TABLET BY MOUTH AT BEDTIME   multivitamin capsule Take 1 capsule by mouth daily.   ONE TOUCH ULTRA 2 w/Device Kit Check blood sugar once daily and as directed. Dx V13.6   OneTouch Delica Lancets 85R Misc CHECK BLOOD SUGAR ONCE DAILY AND AS DIRECTED. DX E11.9 What changed: See the new instructions.   OneTouch Ultra test strip Generic drug: glucose blood CHECK BLOOD SUGAR ONCE DAILY AND AS DIRECTED. DX E11.9   PATADAY OP Place 1 drop into both eyes daily.   REFRESH OP Place 1 drop into both eyes daily as needed (dry eyes).   sertraline 50 MG tablet Commonly known as: ZOLOFT TAKE 1 TABLET BY MOUTH EVERY DAY   trandolapril 4 MG tablet Commonly known as: MAVIK Take 1 tablet (4 mg total) by mouth daily.   vitamin B-12 1000 MCG tablet Commonly known as: CYANOCOBALAMIN Take 1,000 mcg by mouth daily.   Vitamin D 1000 units capsule Take 1,000 Units by mouth daily.      Follow-up Information    Tower, Wynelle Fanny, MD Follow up in 1 week(s).   Specialties: Family Medicine, Radiology Contact information: [address] [place] [address] [phone]            Time coordinating discharge: 25 min  Signed:  [name] DO  [facility] 10/31/2019, 1:39 PM

## 2019-10-31 NOTE — Progress Notes (Signed)
Renee [name] to be D/C'd  per MD order. Discussed with the patient and sister, all questions fully answered.  VSS, Skin clean, dry and intact without evidence of skin break down, no evidence of skin tears noted.  IV catheter discontinued intact. Site without signs and symptoms of complications. Dressing and pressure applied.  An After Visit Summary was printed and given to the patient and sister.   D/c education completed with patient/family including follow up instructions, medication list, d/c activities limitations if indicated, with other d/c instructions as indicated by MD - patient able to verbalize understanding, all questions fully answered.   Patient instructed to return to ED, call 911, or call MD for any changes in condition.   Patient to be escorted via [place], and D/C home via private auto.

## 2019-10-31 NOTE — Progress Notes (Signed)
Patient is adamant that she does not want to stay for 3 doses of IV abx.  Will give dose for today at noon and then change to PO ceftin to finish at home. Renee [name]

## 2019-10-31 NOTE — Plan of Care (Signed)
  Problem: Clinical Measurements: Goal: Ability to maintain clinical measurements within normal limits will improve 10/31/2019 1534 by Minna Antis, RN Outcome: Adequate for Discharge 10/31/2019 1532 by Minna Antis, RN Outcome: Progressing Goal: Will remain free from infection 10/31/2019 1534 by Minna Antis, RN Outcome: Adequate for Discharge 10/31/2019 1532 by Minna Antis, RN Outcome: Progressing Goal: Diagnostic test results will improve 10/31/2019 1534 by Minna Antis, RN Outcome: Adequate for Discharge 10/31/2019 1532 by Minna Antis, RN Outcome: Progressing Goal: Respiratory complications will improve 10/31/2019 1534 by Minna Antis, RN Outcome: Adequate for Discharge 10/31/2019 1532 by Minna Antis, RN Outcome: Progressing Goal: Cardiovascular complication will be avoided 10/31/2019 1534 by Minna Antis, RN Outcome: Adequate for Discharge 10/31/2019 1532 by Minna Antis, RN Outcome: Progressing   Problem: Activity: Goal: Risk for activity intolerance will decrease 10/31/2019 1534 by Minna Antis, RN Outcome: Adequate for Discharge 10/31/2019 1532 by Minna Antis, RN Outcome: Adequate for Discharge

## 2019-10-31 NOTE — Progress Notes (Signed)
Pt's bp=183/63 this am. Denies pain or any discomfort. Made NP on call aware.

## 2019-11-04 LAB — CULTURE, BLOOD (ROUTINE X 2)
Culture: NO GROWTH
Culture: NO GROWTH

## 2019-11-05 ENCOUNTER — Other Ambulatory Visit: Payer: Self-pay

## 2019-11-05 ENCOUNTER — Ambulatory Visit (INDEPENDENT_AMBULATORY_CARE_PROVIDER_SITE_OTHER): Payer: Medicare HMO | Admitting: Family Medicine

## 2019-11-05 ENCOUNTER — Encounter: Payer: Self-pay | Admitting: Family Medicine

## 2019-11-05 VITALS — BP 128/53 | HR 65 | Temp 97.9°F | Ht 61.0 in | Wt 190.1 lb

## 2019-11-05 DIAGNOSIS — I700 Atherosclerosis of aorta: Secondary | ICD-10-CM

## 2019-11-05 DIAGNOSIS — R531 Weakness: Secondary | ICD-10-CM

## 2019-11-05 DIAGNOSIS — I10 Essential (primary) hypertension: Secondary | ICD-10-CM | POA: Diagnosis not present

## 2019-11-05 DIAGNOSIS — I69352 Hemiplegia and hemiparesis following cerebral infarction affecting left dominant side: Secondary | ICD-10-CM

## 2019-11-05 DIAGNOSIS — N3000 Acute cystitis without hematuria: Secondary | ICD-10-CM

## 2019-11-05 DIAGNOSIS — N184 Chronic kidney disease, stage 4 (severe): Secondary | ICD-10-CM

## 2019-11-05 NOTE — Assessment & Plan Note (Signed)
No clinical changes  bp and DM are well controlled

## 2019-11-05 NOTE — Progress Notes (Signed)
 Subjective:    Patient ID: Renee [name], female    DOB: 01/31/1929, 86 y.o.   MRN: [id number]  This visit occurred during the SARS-CoV-2 public health emergency.  Safety protocols were in place, including screening questions prior to the visit, additional usage of staff PPE, and extensive cleaning of exam room while observing appropriate contact time as indicated for disinfecting solutions.    HPI Pt presents for f/u after hospitalization   She was hospitalized from 3/12 to 3/13 for uti She presented with refractory uti symptoms (was on abx) with fever up to 101 and dysuria  There were no po abx available  ucx prev had shown sens to levaquin  She has stage 4 CKD Pharmacy recommended cefepime    Hospital Course by Problem:   Refractory UTI -Patient was seen by nephrology on 3/4 with c/o mild intermittent dysuria -Urine culture positive as above -Rx was called into her pharmacy on 3/10 and so she only had a couple of doses of antibiotics prior to today's visit -This may be a refractory UTI or may be not longer for meds to start working -Fever x1 outpatient -Has been given 2 doses of IV cefepime and will switch to oral to complete course -Urine cultures here in the hospital showed no growth so not convinced it was a Levaquin failure  HTN -Continue Norvasc, hydralazine, Toprol XL  DM -Resume home meds  Obesity -Estimated body mass index is 33.53 kg/m as calculated from the following:   Height as of 07/13/19: 5' 1" (1.549 m).   Weight as of this encounter: 80.5 kg.  H/o CVA -Continue ASA 325 mg daily   HLD -Hold Lopid due to limited inpatient utility  Stage 3b-4 CKD -Appears to be stable at this time  Lab Results  Component Value Date   CREATININE 1.39 (H) 10/31/2019   BUN 19 10/31/2019   NA 140 10/31/2019   K 4.1 10/31/2019   CL 107 10/31/2019   CO2 25 10/31/2019     Depression/anxiety -Continue Xanax, Remeron, Zoloft  At discharge bp was  elevated at 158/64 As mentioned her ucx in hosp was neg  (insignificant growth)  As were blood cx  covid 19 test is negative   CXR: DG Chest Portable 1 View  Result Date: 10/30/2019 CLINICAL DATA:  Fever, cough EXAM: PORTABLE CHEST 1 VIEW COMPARISON:  06/30/2019 FINDINGS: Cardiomegaly. Both lungs are clear. The visualized skeletal structures are unremarkable. IMPRESSION: Cardiomegaly without acute abnormality of the lungs in AP portable projection. Electronically Signed   By: Alex  Bibbey M.D.   On: 10/30/2019 13:50     Wt Readings from Last 3 Encounters:  11/05/19 190 lb 1 oz (86.2 kg)  10/30/19 177 lb 7.5 oz (80.5 kg)  07/13/19 177 lb 8 oz (80.5 kg)   35.91 kg/m  She was 180 lb at nephrology office before admission )   bp is stable today (it was of note 152/80 at the nephrology office)- Dr Lateef is aware  No cp or palpitations or headaches or edema  No side effects to medicines  BP Readings from Last 3 Encounters:  11/05/19 (!) 146/58  10/31/19 (!) 158/64  07/13/19 132/72    Taking trandolapril, hydralazine, metoprolol and amlodipine  At home her bp tend to be 120s-130s systolic   Trying to avoid diuretics given age and fall risk and renal fx  Still feels weak (generally)   Has to urinate pretty frequently   Sugar is running a little high    141 this am  Goes up with infection  Appetite is good  Not eating differently   Home PT helps and would like [place] to order it again  [facility]  She is not doing much exercise lately  She is trying to walk (slowly)  Using her dumbells   Lab Results  Component Value Date   HGBA1C 6.0 03/30/2019    Patient Active Problem List   Diagnosis Date Noted  . Urinary tract infection 10/30/2019  . Hyperlipidemia   . Diabetes mellitus type 2 in obese ([place])   . Aortic atherosclerosis ([place]) 07/13/2019  . H/O sepsis 07/01/2019  . CKD (chronic kidney disease) stage 4, GFR 15-29 ml/min (HCC) 07/01/2019  . Lower abdominal pain  05/11/2019  . Routine general medical examination at a health care facility 03/30/2019  . External hemorrhoid 01/17/2018  . History of colitis 01/17/2018  . Generalized weakness 12/24/2016  . Diabetic retinopathy ([place]) 12/11/2016  . History of CVA (cerebrovascular accident) 12/11/2016  . Estrogen deficiency 08/30/2015  . Encounter for Medicare annual wellness exam 04/17/2013  . Mobility impaired 06/26/2011  . History of retinal detachment 01/10/2011  . Sleep apnea 11/28/2010  . Depression with anxiety 08/25/2010  . Hemiplegia, late effect of cerebrovascular disease ([place]) 07/05/2010  . POSTHERPETIC NEURALGIA 11/09/2009  . Renal insufficiency 06/29/2008  . Chronic back pain 01/26/2008  . EDEMA 01/26/2008  . B12 deficiency 01/10/2007  . Type 2 diabetes, controlled, with retinopathy ([place]) 11/27/2006  . HLD (hyperlipidemia) 11/27/2006  . Essential hypertension 11/27/2006  . FIBROCYSTIC BREAST DISEASE 11/27/2006  . ROSACEA 11/27/2006  . OSTEOARTHRITIS 11/27/2006  . URINARY INCONTINENCE, MIXED 11/27/2006   Past Medical History:  Diagnosis Date  . Angioedema    possibly from voltaren  . Bronchopneumonia 12/11/2016  . Degenerative disc disease   . Diabetes mellitus    type II  . Hyperlipidemia   . Hypertension   . LVH (left ventricular hypertrophy)    and atrial enlargement by echo in past with nl EF  . Nasal pruritis   . Osteoarthritis   . Osteopenia   . Renal insufficiency   . Sleep apnea   . Stroke [facility]) 05/2010   Small vessel sobcortical (in [place] trial) with Dr Leonie Man, residual L hemiparesis  . Vitamin B 12 deficiency 04/08   Past Surgical History:  Procedure Laterality Date  . ABDOMINAL HYSTERECTOMY     BSO-fibroids  . APPENDECTOMY    . BACK SURGERY    . COLON SURGERY     due to punctured intestines  . EYE SURGERY     cataract extraction  . KNEE SURGERY     arthroscope  . PARS PLANA VITRECTOMY  07/31/2011   Procedure: PARS PLANA VITRECTOMY WITH 25 GAUGE;   Surgeon: [name], MD;  Location: [facility];  Service: Ophthalmology;  Laterality: Right;  REMOVAL OF SILICONE OIL AND LASER RIGHT EYE  . RETINAL DETACHMENT SURGERY  02/18/11   times 2  . SPINE SURGERY  08/09   spinal decompression surgery   Social History   Tobacco Use  . Smoking status: Never Smoker  . Smokeless tobacco: Never Used  Substance Use Topics  . Alcohol use: No    Alcohol/week: 0.0 standard drinks  . Drug use: No   Family History  Problem Relation Age of Onset  . COPD Brother   . Cancer Sister        brain tumor with hemmorhage  . Heart disease Sister  CAD   Allergies  Allergen Reactions  . Diclofenac Sodium Other (See Comments)    REACTION: angioedema  . Pioglitazone Swelling   Current Outpatient Medications on File Prior to Visit  Medication Sig Dispense Refill  . acetaminophen (TYLENOL) 500 MG tablet Take 1,000 mg by mouth at bedtime.     . albuterol (VENTOLIN HFA) 108 (90 Base) MCG/ACT inhaler Inhale 2 puffs into the lungs every 4 (four) hours as needed for wheezing or shortness of breath. 18 g 3  . ALPRAZolam (XANAX) 0.5 MG tablet TAKE 1 TABLET BY MOUTH TWICE A DAY AS NEEDED FOR ANXIETY (Patient taking differently: Take 0.5 mg by mouth 2 (two) times daily as needed for anxiety. ) 45 tablet 3  . amLODipine (NORVASC) 10 MG tablet Take 1 tablet (10 mg total) by mouth daily. 90 tablet 3  . aspirin 325 MG tablet Take 325 mg by mouth daily.     . Blood Glucose Monitoring Suppl (ONE TOUCH ULTRA 2) w/Device KIT Check blood sugar once daily and as directed. Dx E11.9 1 each 0  . calcium-vitamin D (OSCAL WITH D) 500-200 MG-UNIT per tablet Take 1 tablet by mouth daily.     . Cholecalciferol (VITAMIN D) 1000 UNITS capsule Take 1,000 Units by mouth daily.     . docusate sodium (COLACE) 100 MG capsule Take 200 mg by mouth at bedtime.    . ferrous sulfate 325 (65 FE) MG tablet Take 325 mg by mouth 2 (two) times daily.    . gemfibrozil (LOPID) 600 MG tablet TAKE 1  TABLET BY MOUTH EVERY DAY (Patient taking differently: Take 600 mg by mouth daily. ) 90 tablet 2  . glipiZIDE (GLUCOTROL XL) 2.5 MG 24 hr tablet TAKE 1 TABLET BY MOUTH EVERY DAY WITH BREAKFAST (Patient taking differently: Take 2.5 mg by mouth daily with breakfast. ) 90 tablet 3  . glucose blood (ONETOUCH ULTRA) test strip CHECK BLOOD SUGAR ONCE DAILY AND AS DIRECTED. DX E11.9 100 strip 1  . hydrALAZINE (APRESOLINE) 100 MG tablet Take 100 mg by mouth 3 (three) times daily.     . metoprolol succinate (TOPROL-XL) 25 MG 24 hr tablet Take 0.5 tablets (12.5 mg total) by mouth 2 (two) times daily. 90 tablet 3  . mirtazapine (REMERON) 15 MG tablet TAKE 1/2 TABLET BY MOUTH AT BEDTIME (Patient taking differently: Take 7.5 mg by mouth at bedtime. ) 45 tablet 3  . Multiple Vitamin (MULTIVITAMIN) capsule Take 1 capsule by mouth daily.     . Olopatadine HCl (PATADAY OP) Place 1 drop into both eyes daily.    . OneTouch Delica Lancets 30G MISC CHECK BLOOD SUGAR ONCE DAILY AND AS DIRECTED. DX E11.9 (Patient taking differently: 1 Device by Other route daily. ) 100 each 2  . Polyvinyl Alcohol-Povidone (REFRESH OP) Place 1 drop into both eyes daily as needed (dry eyes).    . sertraline (ZOLOFT) 50 MG tablet TAKE 1 TABLET BY MOUTH EVERY DAY (Patient taking differently: Take 50 mg by mouth daily. ) 90 tablet 2  . trandolapril (MAVIK) 4 MG tablet Take 1 tablet (4 mg total) by mouth daily. 90 tablet 3  . vitamin B-12 (CYANOCOBALAMIN) 1000 MCG tablet Take 1,000 mcg by mouth daily.      No current facility-administered medications on file prior to visit.    Review of Systems  Constitutional: Negative for activity change, appetite change, fatigue, fever and unexpected weight change.  HENT: Negative for congestion, ear pain, rhinorrhea, sinus pressure and sore throat.     Eyes: Negative for pain, redness and visual disturbance.  Respiratory: Negative for cough, shortness of breath and wheezing.   Cardiovascular: Negative for  chest pain and palpitations.  Gastrointestinal: Negative for abdominal pain, blood in stool, constipation and diarrhea.  Endocrine: Negative for polydipsia and polyuria.  Genitourinary: Negative for decreased urine volume, difficulty urinating, dysuria, flank pain, frequency and urgency.  Musculoskeletal: Positive for arthralgias and back pain. Negative for myalgias.  Skin: Negative for pallor and rash.  Allergic/Immunologic: Negative for environmental allergies.  Neurological: Positive for weakness. Negative for dizziness, syncope, light-headedness, numbness and headaches.  Hematological: Negative for adenopathy. Does not bruise/bleed easily.  Psychiatric/Behavioral: Negative for decreased concentration and dysphoric mood. The patient is nervous/anxious.        Objective:   Physical Exam Constitutional:      General: She is not in acute distress.    Appearance: Normal appearance. She is well-developed. She is obese. She is not ill-appearing or diaphoretic.  HENT:     Head: Normocephalic and atraumatic.     Mouth/Throat:     Mouth: Mucous membranes are moist.  Eyes:     General: No scleral icterus.    Conjunctiva/sclera: Conjunctivae normal.     Pupils: Pupils are equal, round, and reactive to light.  Neck:     Thyroid: No thyromegaly.     Vascular: No carotid bruit or JVD.  Cardiovascular:     Rate and Rhythm: Normal rate and regular rhythm.     Heart sounds: Normal heart sounds. No gallop.   Pulmonary:     Effort: Pulmonary effort is normal. No respiratory distress.     Breath sounds: Normal breath sounds. No wheezing or rales.  Abdominal:     General: Bowel sounds are normal. There is no distension or abdominal bruit.     Palpations: Abdomen is soft. There is no mass.     Tenderness: There is no abdominal tenderness. There is no right CVA tenderness or left CVA tenderness.     Comments: No suprapubic tenderness or fullness     Musculoskeletal:     Cervical back: Normal  range of motion and neck supple.     Right lower leg: Edema present.     Left lower leg: Edema present.     Comments: Trace to one plus pedal edema   Lymphadenopathy:     Cervical: No cervical adenopathy.  Skin:    General: Skin is warm and dry.     Coloration: Skin is not pale.     Findings: No erythema or rash.  Neurological:     Mental Status: She is alert.     Cranial Nerves: No cranial nerve deficit.     Motor: Weakness present.     Coordination: Coordination abnormal.     Gait: Gait abnormal.     Deep Tendon Reflexes: Reflexes are normal and symmetric.     Comments: Unable to get out of chair w/o assistance  Generalized weakness Is able to grip walker Walks in a more bent forward posture  Psychiatric:        Mood and Affect: Mood normal.           Assessment & Plan:   Problem List Items Addressed This Visit      Cardiovascular and Mediastinum   Essential hypertension    bp in fair control at this time  BP Readings from Last 1 Encounters:  11/05/19 (!) 128/53   No changes needed Most recent labs reviewed  Disc lifstyle change   with low sodium diet and exercise        Aortic atherosclerosis (HCC)    No clinical changes  bp and DM are well controlled        Nervous and Auditory   Hemiplegia, late effect of cerebrovascular disease ([place])    Pt is more weak after recent hospitalization for uti  Reviewed hospital records, lab results and studies in detail  Home PT ordered        Genitourinary   CKD (chronic kidney disease) stage 4, GFR 15-29 ml/min (HCC)    Labs are back to baseline after hospitalization for uti  tx with IV cephalosporin Reviewed hospital records, lab results and studies in detail        Urinary tract infection - Primary    Recent hospitalization for uti that did not initially respond to levaquin (renal dose) Reviewed hospital records, lab results and studies in detail   Pt presented with fever and delirium and quickly improved with  fluids and IV cefepime  Now resolved Original cx was neg -likely starting to respond  Doing well now         Other   Generalized weakness    S/p uti /hospitalization  Reviewed hospital records, lab results and studies in detail    Ref for home PT as pt is homebound

## 2019-11-05 NOTE — Assessment & Plan Note (Signed)
Labs are back to baseline after hospitalization for uti  tx with IV cephalosporin Reviewed hospital records, lab results and studies in detail

## 2019-11-05 NOTE — Assessment & Plan Note (Signed)
bp in fair control at this time  BP Readings from Last 1 Encounters:  11/05/19 (!) 128/53   No changes needed Most recent labs reviewed  Disc lifstyle change with low sodium diet and exercise

## 2019-11-05 NOTE — Assessment & Plan Note (Signed)
Recent hospitalization for uti that did not initially respond to levaquin (renal dose) Reviewed hospital records, lab results and studies in detail   Pt presented with fever and delirium and quickly improved with fluids and IV cefepime  Now resolved Original cx was neg -likely starting to respond  Doing well now

## 2019-11-05 NOTE — Assessment & Plan Note (Signed)
S/p uti /hospitalization  Reviewed hospital records, lab results and studies in detail    Ref for home PT as pt is homebound

## 2019-11-05 NOTE — Patient Instructions (Signed)
Your uti is better  - so glad about that  BP is better also   I will place a home PT order and the office will call you to set that up   Take care of yourself  Do the exercises you know Walk with walker when you can    Elevate your feet when you sit  If the swelling bothers you- try the support stockings also   Let us know if you need anything

## 2019-11-05 NOTE — Assessment & Plan Note (Signed)
Pt is more weak after recent hospitalization for uti  Reviewed hospital records, lab results and studies in detail  Home PT ordered

## 2019-11-17 ENCOUNTER — Ambulatory Visit: Payer: Medicare HMO | Attending: Internal Medicine

## 2019-11-17 DIAGNOSIS — Z23 Encounter for immunization: Secondary | ICD-10-CM

## 2019-11-17 DIAGNOSIS — R69 Illness, unspecified: Secondary | ICD-10-CM | POA: Diagnosis not present

## 2019-11-17 NOTE — Progress Notes (Signed)
   Covid-19 Vaccination Clinic  Name:  Renee [name]    MRN: [id number] DOB: 1929/05/01  11/17/2019  Renee [name] was observed post Covid-19 immunization for 15 minutes without incident. She was provided with Vaccine Information Sheet and instruction to access the V-Safe system.   Renee [name] was instructed to call 911 with any severe reactions post vaccine: Marland Kitchen Difficulty breathing  . Swelling of face and throat  . A fast heartbeat  . A bad rash all over body  . Dizziness and weakness   Immunizations Administered    Name Date Dose VIS Date Route   Pfizer COVID-19 Vaccine 11/17/2019 10:50 AM 0.3 mL 07/31/2019 Intramuscular   Manufacturer: [place]   Lot: [id number]   [place]: [id number]

## 2019-11-19 ENCOUNTER — Other Ambulatory Visit: Payer: Self-pay | Admitting: Family Medicine

## 2019-11-19 NOTE — Telephone Encounter (Signed)
Name of Medication:Xanax Name of Pharmacy:[facility] or Written Date and Quantity:08/10/19 #45tabs with 3refills Last Office Visit and Type:Hospital f/u 11/05/19 Next Office Visit and Type: 6 month f/u on 01/05/20 Last Controlled Substance Agreement Date:09/12/16 Last UDS:09/12/16

## 2019-11-24 ENCOUNTER — Telehealth: Payer: Self-pay

## 2019-11-24 NOTE — Telephone Encounter (Signed)
Called [facility] to check on Renee [name] from 2 weeks ago. Referral was overlooked and they apologize. They can go out to the patients home this weekend and will call her before they come.

## 2019-11-24 NOTE — Telephone Encounter (Signed)
Pt calling to see status of [place] [facility] ordered in 10/2019 and pt has not heard from [facility]. Rosaria Ferries [facility] will ck on status of [place] [facility] and contact pt; pt also fell on 11/17/19 and mid back did not start hurting until 11/22/19. First available appt pt could come in was 11/27/19 at 10 AM. Pt will cb prior to appt if needed. FYI to Dr Glori Bickers and [facility].

## 2019-11-24 NOTE — Telephone Encounter (Signed)
Aware, thanks for letting me know -inform pt if she is not aware

## 2019-11-24 NOTE — Telephone Encounter (Signed)
Pt notified of Dr. Marliss Coots comments. Pt advised of HH error and that they will call her this week to start services

## 2019-11-25 DIAGNOSIS — R69 Illness, unspecified: Secondary | ICD-10-CM | POA: Diagnosis not present

## 2019-11-27 ENCOUNTER — Encounter: Payer: Self-pay | Admitting: Family Medicine

## 2019-11-27 ENCOUNTER — Ambulatory Visit (INDEPENDENT_AMBULATORY_CARE_PROVIDER_SITE_OTHER): Payer: Medicare HMO | Admitting: Family Medicine

## 2019-11-27 ENCOUNTER — Other Ambulatory Visit: Payer: Self-pay

## 2019-11-27 VITALS — BP 125/62 | HR 83 | Temp 97.6°F | Ht 61.0 in | Wt 183.6 lb

## 2019-11-27 DIAGNOSIS — W101XXA Fall (on)(from) sidewalk curb, initial encounter: Secondary | ICD-10-CM

## 2019-11-27 DIAGNOSIS — S20221A Contusion of right back wall of thorax, initial encounter: Secondary | ICD-10-CM

## 2019-11-27 DIAGNOSIS — Z7409 Other reduced mobility: Secondary | ICD-10-CM

## 2019-11-27 DIAGNOSIS — S20229A Contusion of unspecified back wall of thorax, initial encounter: Secondary | ICD-10-CM | POA: Insufficient documentation

## 2019-11-27 NOTE — Patient Instructions (Signed)
I'm glad you are ok  The contusion will heal gradually  Warm compresses help when needed   Use your walker Always have assistance for stepping up or down  A slope or ramp is usually better   Proceed with PT when they start your visits

## 2019-11-27 NOTE — Progress Notes (Signed)
Subjective:    Patient ID: Renee [name], female    DOB: 1928-11-27, 86 y.o.   MRN: [id number]  This visit occurred during the SARS-CoV-2 public health emergency.  Safety protocols were in place, including screening questions prior to the visit, additional usage of staff PPE, and extensive cleaning of exam room while observing appropriate contact time as indicated for disinfecting solutions.    HPI Pt presents for injuries from a fall on 11/17/19  Wt Readings from Last 3 Encounters:  11/27/19 183 lb 9 oz (83.3 kg)  11/05/19 190 lb 1 oz (86.2 kg)  10/30/19 177 lb 7.5 oz (80.5 kg)   34.68 kg/m   She has home health planned with PT-some trouble getting that started from [facility]  She already suffers from chronic back pain   She stepped off a curb outside dentist office when leaving  Lost balance  Fell into a column (broke her fall some)  Did not hit her head  Did not get scraped -just bruises  Hit the ground with buttocks  Staff helped her up  Walked to the car with no problem   Did not get sore for several days   Back pain - R side of mid back  Feels sore to to the touch  Otherwise does not bother her   She is resting well at night   Patient Active Problem List   Diagnosis Date Noted  . Contusion of back 11/27/2019  . Urinary tract infection 10/30/2019  . Hyperlipidemia   . Diabetes mellitus type 2 in obese ([place])   . Aortic atherosclerosis ([place]) 07/13/2019  . H/O sepsis 07/01/2019  . CKD (chronic kidney disease) stage 4, GFR 15-29 ml/min (HCC) 07/01/2019  . Lower abdominal pain 05/11/2019  . Routine general medical examination at a health care facility 03/30/2019  . External hemorrhoid 01/17/2018  . History of colitis 01/17/2018  . Generalized weakness 12/24/2016  . Diabetic retinopathy ([place]) 12/11/2016  . History of CVA (cerebrovascular accident) 12/11/2016  . Estrogen deficiency 08/30/2015  . Encounter for Medicare annual wellness exam 04/17/2013  . Mobility  impaired 06/26/2011  . History of retinal detachment 01/10/2011  . Sleep apnea 11/28/2010  . Depression with anxiety 08/25/2010  . Hemiplegia, late effect of cerebrovascular disease ([place]) 07/05/2010  . POSTHERPETIC NEURALGIA 11/09/2009  . Renal insufficiency 06/29/2008  . Chronic back pain 01/26/2008  . EDEMA 01/26/2008  . B12 deficiency 01/10/2007  . Type 2 diabetes, controlled, with retinopathy ([place]) 11/27/2006  . HLD (hyperlipidemia) 11/27/2006  . Essential hypertension 11/27/2006  . FIBROCYSTIC BREAST DISEASE 11/27/2006  . ROSACEA 11/27/2006  . OSTEOARTHRITIS 11/27/2006  . URINARY INCONTINENCE, MIXED 11/27/2006   Past Medical History:  Diagnosis Date  . Angioedema    possibly from voltaren  . Bronchopneumonia 12/11/2016  . Degenerative disc disease   . Diabetes mellitus    type II  . Hyperlipidemia   . Hypertension   . LVH (left ventricular hypertrophy)    and atrial enlargement by echo in past with nl EF  . Nasal pruritis   . Osteoarthritis   . Osteopenia   . Renal insufficiency   . Sleep apnea   . Stroke [facility]) 05/2010   Small vessel sobcortical (in [place] trial) with Dr Leonie Man, residual L hemiparesis  . Vitamin B 12 deficiency 04/08   Past Surgical History:  Procedure Laterality Date  . ABDOMINAL HYSTERECTOMY     BSO-fibroids  . APPENDECTOMY    . BACK SURGERY    . COLON SURGERY  due to punctured intestines  . EYE SURGERY     cataract extraction  . KNEE SURGERY     arthroscope  . PARS PLANA VITRECTOMY  07/31/2011   Procedure: PARS PLANA VITRECTOMY WITH 25 GAUGE;  Surgeon: [name], MD;  Location: [place];  Service: Ophthalmology;  Laterality: Right;  REMOVAL OF SILICONE OIL AND LASER RIGHT EYE  . RETINAL DETACHMENT SURGERY  02/18/11   times 2  . SPINE SURGERY  08/09   spinal decompression surgery   Social History   Tobacco Use  . Smoking status: Never Smoker  . Smokeless tobacco: Never Used  Substance Use Topics  . Alcohol use: No     Alcohol/week: 0.0 standard drinks  . Drug use: No   Family History  Problem Relation Age of Onset  . COPD Brother   . Cancer Sister        brain tumor with hemmorhage  . Heart disease Sister        CAD   Allergies  Allergen Reactions  . Diclofenac Sodium Other (See Comments)    REACTION: angioedema  . Pioglitazone Swelling   Current Outpatient Medications on File Prior to Visit  Medication Sig Dispense Refill  . acetaminophen (TYLENOL) 500 MG tablet Take 1,000 mg by mouth at bedtime.     Marland Kitchen albuterol (VENTOLIN HFA) 108 (90 Base) MCG/ACT inhaler Inhale 2 puffs into the lungs every 4 (four) hours as needed for wheezing or shortness of breath. 18 g 3  . ALPRAZolam (XANAX) 0.5 MG tablet Take 1 tablet (0.5 mg total) by mouth 2 (two) times daily as needed for anxiety. TAKE 1/2-1 TABLET BY MOUTH TWICE A DAY AS NEEDED FOR ANXIETY 60 tablet 0  . amLODipine (NORVASC) 10 MG tablet Take 1 tablet (10 mg total) by mouth daily. 90 tablet 3  . aspirin 325 MG tablet Take 325 mg by mouth daily.     . Blood Glucose Monitoring Suppl (ONE TOUCH ULTRA 2) w/Device KIT Check blood sugar once daily and as directed. Dx E11.9 1 each 0  . calcium-vitamin D (OSCAL WITH D) 500-200 MG-UNIT per tablet Take 1 tablet by mouth daily.     . Cholecalciferol (VITAMIN D) 1000 UNITS capsule Take 1,000 Units by mouth daily.     Marland Kitchen docusate sodium (COLACE) 100 MG capsule Take 200 mg by mouth at bedtime.    . ferrous sulfate 325 (65 FE) MG tablet Take 325 mg by mouth 2 (two) times daily.    Marland Kitchen gemfibrozil (LOPID) 600 MG tablet TAKE 1 TABLET BY MOUTH EVERY DAY (Patient taking differently: Take 600 mg by mouth daily. ) 90 tablet 2  . glipiZIDE (GLUCOTROL XL) 2.5 MG 24 hr tablet TAKE 1 TABLET BY MOUTH EVERY DAY WITH BREAKFAST (Patient taking differently: Take 2.5 mg by mouth daily with breakfast. ) 90 tablet 3  . glucose blood (ONETOUCH ULTRA) test strip CHECK BLOOD SUGAR ONCE DAILY AND AS DIRECTED. DX E11.9 100 strip 1  .  hydrALAZINE (APRESOLINE) 100 MG tablet Take 100 mg by mouth 3 (three) times daily.     . metoprolol succinate (TOPROL-XL) 25 MG 24 hr tablet Take 0.5 tablets (12.5 mg total) by mouth 2 (two) times daily. 90 tablet 3  . mirtazapine (REMERON) 15 MG tablet TAKE 1/2 TABLET BY MOUTH AT BEDTIME (Patient taking differently: Take 7.5 mg by mouth at bedtime. ) 45 tablet 3  . Multiple Vitamin (MULTIVITAMIN) capsule Take 1 capsule by mouth daily.     . Olopatadine  HCl (PATADAY OP) Place 1 drop into both eyes daily.    Glory Rosebush Delica Lancets 82N MISC CHECK BLOOD SUGAR ONCE DAILY AND AS DIRECTED. DX E11.9 (Patient taking differently: 1 Device by Other route daily. ) 100 each 2  . Polyvinyl Alcohol-Povidone (REFRESH OP) Place 1 drop into both eyes daily as needed (dry eyes).    . sertraline (ZOLOFT) 50 MG tablet TAKE 1 TABLET BY MOUTH EVERY DAY (Patient taking differently: Take 50 mg by mouth daily. ) 90 tablet 2  . trandolapril (MAVIK) 4 MG tablet Take 1 tablet (4 mg total) by mouth daily. 90 tablet 3  . vitamin B-12 (CYANOCOBALAMIN) 1000 MCG tablet Take 1,000 mcg by mouth daily.      No current facility-administered medications on file prior to visit.    Review of Systems  Constitutional: Negative for activity change, appetite change, fatigue, fever and unexpected weight change.  HENT: Negative for congestion, ear pain, rhinorrhea, sinus pressure and sore throat.   Eyes: Negative for pain, redness and visual disturbance.  Respiratory: Negative for cough, shortness of breath and wheezing.   Cardiovascular: Negative for chest pain and palpitations.  Gastrointestinal: Negative for abdominal pain, blood in stool, constipation and diarrhea.  Endocrine: Negative for polydipsia and polyuria.  Genitourinary: Negative for dysuria, frequency and urgency.  Musculoskeletal: Positive for arthralgias and back pain. Negative for myalgias.  Skin: Negative for pallor and rash.  Allergic/Immunologic: Negative for  environmental allergies.  Neurological: Positive for weakness. Negative for dizziness, tremors, syncope, light-headedness and headaches.  Hematological: Negative for adenopathy. Does not bruise/bleed easily.  Psychiatric/Behavioral: Negative for decreased concentration and dysphoric mood. The patient is not nervous/anxious.        Objective:   Physical Exam Constitutional:      General: She is not in acute distress.    Appearance: Normal appearance. She is well-developed. She is obese. She is not ill-appearing.  HENT:     Head: Normocephalic and atraumatic.  Eyes:     General: No scleral icterus.    Conjunctiva/sclera: Conjunctivae normal.     Pupils: Pupils are equal, round, and reactive to light.  Cardiovascular:     Rate and Rhythm: Normal rate and regular rhythm.  Pulmonary:     Effort: Pulmonary effort is normal.     Breath sounds: Normal breath sounds. No wheezing or rales.  Abdominal:     General: Bowel sounds are normal. There is no distension.     Palpations: Abdomen is soft.     Tenderness: There is no abdominal tenderness.  Musculoskeletal:        General: Tenderness present.     Cervical back: Normal range of motion and neck supple.     Thoracic back: Signs of trauma and tenderness present. No swelling, deformity or spasms.     Lumbar back: Tenderness present. No swelling, edema, spasms or bony tenderness. Decreased range of motion.     Comments: Tender area of R mid back with bruising  No step off or crepitus  No spinal process tenderness  Lymphadenopathy:     Cervical: No cervical adenopathy.  Skin:    General: Skin is warm and dry.     Coloration: Skin is not pale.     Findings: No erythema or rash.     Comments: Small area of ecchymosis on R mid back w/o swelling  Neurological:     Mental Status: She is alert.     Cranial Nerves: No cranial nerve deficit.  Sensory: No sensory deficit.     Motor: No atrophy or abnormal muscle tone.     Coordination:  Coordination normal.     Deep Tendon Reflexes: Reflexes are normal and symmetric.     Comments: Gait is baseline with walker   Psychiatric:        Mood and Affect: Mood normal.           Assessment & Plan:   Problem List Items Addressed This Visit      Other   Mobility impaired    Pending home PT to start soon  Recent fall with walker going down a curb  Recommended help to do this in the future      Contusion of back - Primary    Old ecchymosis seen on R mid back/ribs with mild tenderness  No indication of fracture or spinal tenderness  Discussed fall prevention in detail  inst to update if pain or soreness worsen      Fall involving sidewalk curb    With her walker  Sustained contusion to R side of mid back and no indication of spinal fracture  Reviewed mobility issues and fall prevention  PT to start soon to work on strength and balance

## 2019-11-29 DIAGNOSIS — W101XXA Fall (on)(from) sidewalk curb, initial encounter: Secondary | ICD-10-CM | POA: Insufficient documentation

## 2019-11-29 NOTE — Assessment & Plan Note (Signed)
Old ecchymosis seen on R mid back/ribs with mild tenderness  No indication of fracture or spinal tenderness  Discussed fall prevention in detail  inst to update if pain or soreness worsen

## 2019-11-29 NOTE — Assessment & Plan Note (Signed)
With her walker  Sustained contusion to R side of mid back and no indication of spinal fracture  Reviewed mobility issues and fall prevention  PT to start soon to work on strength and balance

## 2019-11-29 NOTE — Assessment & Plan Note (Signed)
Pending home PT to start soon  Recent fall with walker going down a curb  Recommended help to do this in the future

## 2019-11-30 ENCOUNTER — Telehealth: Payer: Self-pay

## 2019-11-30 NOTE — Telephone Encounter (Signed)
Pt said starting 11/27/19 at night pt has sharp pain under rt breast and pain also under lt arm. Pain feels continuous. Pt does not see redness, rash,  or blister like area but 8 yrs ago pt had shingles and they felt just like this; pt thinks the shingles are inside and has not come to outside of skin. Pt does not have transportation for today but pt scheduled an in office visit on 12/01/19 at 11:30 with Dr Glori Bickers. UC & ED precautions given and pt voiced understanding. Pt has no covid symptoms, no travel and no known exposure to + covid.

## 2019-11-30 NOTE — Telephone Encounter (Signed)
Aware, I will see her then  

## 2019-12-01 ENCOUNTER — Encounter: Payer: Self-pay | Admitting: Family Medicine

## 2019-12-01 ENCOUNTER — Ambulatory Visit (INDEPENDENT_AMBULATORY_CARE_PROVIDER_SITE_OTHER): Payer: Medicare HMO | Admitting: Family Medicine

## 2019-12-01 ENCOUNTER — Other Ambulatory Visit: Payer: Self-pay

## 2019-12-01 VITALS — BP 144/68 | HR 76 | Temp 97.8°F | Ht 61.0 in | Wt 184.5 lb

## 2019-12-01 DIAGNOSIS — B0223 Postherpetic polyneuropathy: Secondary | ICD-10-CM | POA: Diagnosis not present

## 2019-12-01 DIAGNOSIS — I129 Hypertensive chronic kidney disease with stage 1 through stage 4 chronic kidney disease, or unspecified chronic kidney disease: Secondary | ICD-10-CM | POA: Diagnosis not present

## 2019-12-01 DIAGNOSIS — G8929 Other chronic pain: Secondary | ICD-10-CM | POA: Diagnosis not present

## 2019-12-01 DIAGNOSIS — E1122 Type 2 diabetes mellitus with diabetic chronic kidney disease: Secondary | ICD-10-CM | POA: Diagnosis not present

## 2019-12-01 DIAGNOSIS — M8580 Other specified disorders of bone density and structure, unspecified site: Secondary | ICD-10-CM | POA: Diagnosis not present

## 2019-12-01 DIAGNOSIS — I700 Atherosclerosis of aorta: Secondary | ICD-10-CM | POA: Diagnosis not present

## 2019-12-01 DIAGNOSIS — N184 Chronic kidney disease, stage 4 (severe): Secondary | ICD-10-CM | POA: Diagnosis not present

## 2019-12-01 DIAGNOSIS — R0789 Other chest pain: Secondary | ICD-10-CM

## 2019-12-01 DIAGNOSIS — M519 Unspecified thoracic, thoracolumbar and lumbosacral intervertebral disc disorder: Secondary | ICD-10-CM | POA: Diagnosis not present

## 2019-12-01 DIAGNOSIS — M1990 Unspecified osteoarthritis, unspecified site: Secondary | ICD-10-CM | POA: Diagnosis not present

## 2019-12-01 DIAGNOSIS — I69354 Hemiplegia and hemiparesis following cerebral infarction affecting left non-dominant side: Secondary | ICD-10-CM | POA: Diagnosis not present

## 2019-12-01 MED ORDER — VALACYCLOVIR HCL 1 G PO TABS: 1000.0000 mg | ORAL_TABLET | Freq: Three times a day (TID) | ORAL | 0 refills | Status: DC

## 2019-12-01 NOTE — Assessment & Plan Note (Signed)
L sided under breast and radiating around to back (one side only) This corresponds to past shingles and post herpetic neuralgia area  No rash seen today but pt is worried about recurrent zoster  tx with valtrex 1 g tid for 7d  inst to alert [place] if rash develops or symptoms worsen  Also discussed possibility of msk pain from last fall- but the pain did not follow the fall directly and no pleuritic pain  Update if not starting to improve in a week or if worsening   May consider imaging

## 2019-12-01 NOTE — Patient Instructions (Addendum)
I cannot rule out very early shingles  Take valtrex for a week  Watch for a rash - especially redness and blisters   It could also be rib pain from the last fall  You can try some ice to see if it helps    Keep me posted    Once completely better and you are more than 2 weeks out for your covid vaccine you can look into getting the shingrix vaccine at your pharmacy   If you are interested in the new shingles vaccine (Shingrix) - call your local pharmacy to check on coverage and availability  If affordable, get on a wait list at your pharmacy to get the vaccine.

## 2019-12-01 NOTE — Progress Notes (Signed)
Subjective:    Patient ID: Renee [name], female    DOB: 05-Feb-1929, 86 y.o.   MRN: [id number]  This visit occurred during the SARS-CoV-2 public health emergency.  Safety protocols were in place, including screening questions prior to the visit, additional usage of staff PPE, and extensive cleaning of exam room while observing appropriate contact time as indicated for disinfecting solutions.    HPI Pt presents with pain under L breast  Concerned it may be the beginning of shingles   Wt Readings from Last 3 Encounters:  12/01/19 184 lb 8 oz (83.7 kg)  11/27/19 183 lb 9 oz (83.3 kg)  11/05/19 190 lb 1 oz (86.2 kg)   34.86 kg/m   Pain started under L breast on Friday night  Radiates around the side to the back   Pain is very sharp - like a knife  Comes and goes  Sometimes keeps her up at night  No skin tenderness or sensitivity   No pain with deep breath  Some more pain to move (twisting from waist)  Shirt does not irritate it   Caregiver saw a few red splotches on back   Had shingles years ago in this same area  Had pain for a while afterwards   Has never had a shingles vaccine  Was thinking about it   Not a burning sensation   R side is fine   Last mammogram nl 12/20  No breast lumps noted by pt   Patient Active Problem List   Diagnosis Date Noted  . Fall involving sidewalk curb 11/29/2019  . Contusion of back 11/27/2019  . Urinary tract infection 10/30/2019  . Hyperlipidemia   . Diabetes mellitus type 2 in obese ([place])   . Aortic atherosclerosis ([place]) 07/13/2019  . H/O sepsis 07/01/2019  . CKD (chronic kidney disease) stage 4, GFR 15-29 ml/min (HCC) 07/01/2019  . Lower abdominal pain 05/11/2019  . Routine general medical examination at a health care facility 03/30/2019  . External hemorrhoid 01/17/2018  . History of colitis 01/17/2018  . Generalized weakness 12/24/2016  . Diabetic retinopathy ([place]) 12/11/2016  . History of CVA (cerebrovascular accident)  12/11/2016  . Estrogen deficiency 08/30/2015  . Encounter for Medicare annual wellness exam 04/17/2013  . Chest wall pain 04/07/2013  . Mobility impaired 06/26/2011  . History of retinal detachment 01/10/2011  . Sleep apnea 11/28/2010  . Depression with anxiety 08/25/2010  . Hemiplegia, late effect of cerebrovascular disease ([place]) 07/05/2010  . POSTHERPETIC NEURALGIA 11/09/2009  . Renal insufficiency 06/29/2008  . Chronic back pain 01/26/2008  . EDEMA 01/26/2008  . B12 deficiency 01/10/2007  . Type 2 diabetes, controlled, with retinopathy ([place]) 11/27/2006  . HLD (hyperlipidemia) 11/27/2006  . Essential hypertension 11/27/2006  . FIBROCYSTIC BREAST DISEASE 11/27/2006  . ROSACEA 11/27/2006  . OSTEOARTHRITIS 11/27/2006  . URINARY INCONTINENCE, MIXED 11/27/2006   Past Medical History:  Diagnosis Date  . Angioedema    possibly from voltaren  . Bronchopneumonia 12/11/2016  . Degenerative disc disease   . Diabetes mellitus    type II  . Hyperlipidemia   . Hypertension   . LVH (left ventricular hypertrophy)    and atrial enlargement by echo in past with nl EF  . Nasal pruritis   . Osteoarthritis   . Osteopenia   . Renal insufficiency   . Sleep apnea   . Stroke [facility]) 05/2010   Small vessel sobcortical (in [place] trial) with Dr Leonie Man, residual L hemiparesis  . Vitamin B 12 deficiency  04/08   Past Surgical History:  Procedure Laterality Date  . ABDOMINAL HYSTERECTOMY     BSO-fibroids  . APPENDECTOMY    . BACK SURGERY    . COLON SURGERY     due to punctured intestines  . EYE SURGERY     cataract extraction  . KNEE SURGERY     arthroscope  . PARS PLANA VITRECTOMY  07/31/2011   Procedure: PARS PLANA VITRECTOMY WITH 25 GAUGE;  Surgeon: [name], MD;  Location: [place];  Service: Ophthalmology;  Laterality: Right;  REMOVAL OF SILICONE OIL AND LASER RIGHT EYE  . RETINAL DETACHMENT SURGERY  02/18/11   times 2  . SPINE SURGERY  08/09   spinal decompression surgery    Social History   Tobacco Use  . Smoking status: Never Smoker  . Smokeless tobacco: Never Used  Substance Use Topics  . Alcohol use: No    Alcohol/week: 0.0 standard drinks  . Drug use: No   Family History  Problem Relation Age of Onset  . COPD Brother   . Cancer Sister        brain tumor with hemmorhage  . Heart disease Sister        CAD   Allergies  Allergen Reactions  . Diclofenac Sodium Other (See Comments)    REACTION: angioedema  . Pioglitazone Swelling   Current Outpatient Medications on File Prior to Visit  Medication Sig Dispense Refill  . acetaminophen (TYLENOL) 500 MG tablet Take 1,000 mg by mouth at bedtime.     Marland Kitchen albuterol (VENTOLIN HFA) 108 (90 Base) MCG/ACT inhaler Inhale 2 puffs into the lungs every 4 (four) hours as needed for wheezing or shortness of breath. 18 g 3  . ALPRAZolam (XANAX) 0.5 MG tablet Take 1 tablet (0.5 mg total) by mouth 2 (two) times daily as needed for anxiety. TAKE 1/2-1 TABLET BY MOUTH TWICE A DAY AS NEEDED FOR ANXIETY 60 tablet 0  . amLODipine (NORVASC) 10 MG tablet Take 1 tablet (10 mg total) by mouth daily. 90 tablet 3  . aspirin 325 MG tablet Take 325 mg by mouth daily.     . Blood Glucose Monitoring Suppl (ONE TOUCH ULTRA 2) w/Device KIT Check blood sugar once daily and as directed. Dx E11.9 1 each 0  . calcium-vitamin D (OSCAL WITH D) 500-200 MG-UNIT per tablet Take 1 tablet by mouth daily.     . Cholecalciferol (VITAMIN D) 1000 UNITS capsule Take 1,000 Units by mouth daily.     Marland Kitchen docusate sodium (COLACE) 100 MG capsule Take 200 mg by mouth at bedtime.    . ferrous sulfate 325 (65 FE) MG tablet Take 325 mg by mouth 2 (two) times daily.    Marland Kitchen gemfibrozil (LOPID) 600 MG tablet TAKE 1 TABLET BY MOUTH EVERY DAY (Patient taking differently: Take 600 mg by mouth daily. ) 90 tablet 2  . glipiZIDE (GLUCOTROL XL) 2.5 MG 24 hr tablet TAKE 1 TABLET BY MOUTH EVERY DAY WITH BREAKFAST (Patient taking differently: Take 2.5 mg by mouth daily with  breakfast. ) 90 tablet 3  . glucose blood (ONETOUCH ULTRA) test strip CHECK BLOOD SUGAR ONCE DAILY AND AS DIRECTED. DX E11.9 100 strip 1  . hydrALAZINE (APRESOLINE) 100 MG tablet Take 100 mg by mouth 3 (three) times daily.     . metoprolol succinate (TOPROL-XL) 25 MG 24 hr tablet Take 0.5 tablets (12.5 mg total) by mouth 2 (two) times daily. 90 tablet 3  . mirtazapine (REMERON) 15 MG tablet TAKE  1/2 TABLET BY MOUTH AT BEDTIME (Patient taking differently: Take 7.5 mg by mouth at bedtime. ) 45 tablet 3  . Multiple Vitamin (MULTIVITAMIN) capsule Take 1 capsule by mouth daily.     . Olopatadine HCl (PATADAY OP) Place 1 drop into both eyes daily.    Glory Rosebush Delica Lancets 16X MISC CHECK BLOOD SUGAR ONCE DAILY AND AS DIRECTED. DX E11.9 (Patient taking differently: 1 Device by Other route daily. ) 100 each 2  . Polyvinyl Alcohol-Povidone (REFRESH OP) Place 1 drop into both eyes daily as needed (dry eyes).    . sertraline (ZOLOFT) 50 MG tablet TAKE 1 TABLET BY MOUTH EVERY DAY (Patient taking differently: Take 50 mg by mouth daily. ) 90 tablet 2  . trandolapril (MAVIK) 4 MG tablet Take 1 tablet (4 mg total) by mouth daily. 90 tablet 3  . vitamin B-12 (CYANOCOBALAMIN) 1000 MCG tablet Take 1,000 mcg by mouth daily.      No current facility-administered medications on file prior to visit.     Review of Systems  Constitutional: Negative for activity change, appetite change, fatigue, fever and unexpected weight change.  HENT: Negative for congestion, ear pain, rhinorrhea, sinus pressure and sore throat.   Eyes: Negative for pain, redness and visual disturbance.  Respiratory: Negative for cough, shortness of breath and wheezing.   Cardiovascular: Negative for chest pain and palpitations.  Gastrointestinal: Negative for abdominal pain, blood in stool, constipation and diarrhea.  Endocrine: Negative for polydipsia and polyuria.  Genitourinary: Negative for dysuria, frequency and urgency.  Musculoskeletal:  Negative for arthralgias, back pain and myalgias.       Pain of L chest and back (just under breast and around side)   Skin: Negative for color change, pallor, rash and wound.  Allergic/Immunologic: Negative for environmental allergies.  Neurological: Negative for dizziness, syncope and headaches.  Hematological: Negative for adenopathy. Does not bruise/bleed easily.  Psychiatric/Behavioral: Negative for decreased concentration and dysphoric mood. The patient is not nervous/anxious.        Objective:   Physical Exam Constitutional:      General: She is not in acute distress.    Appearance: Normal appearance. She is obese. She is not ill-appearing or diaphoretic.  HENT:     Head: Normocephalic and atraumatic.  Eyes:     General:        Right eye: No discharge.        Left eye: No discharge.     Conjunctiva/sclera: Conjunctivae normal.     Pupils: Pupils are equal, round, and reactive to light.  Cardiovascular:     Rate and Rhythm: Normal rate and regular rhythm.     Heart sounds: Normal heart sounds.  Pulmonary:     Effort: Pulmonary effort is normal. No respiratory distress.     Breath sounds: Normal breath sounds. No stridor. No wheezing, rhonchi or rales.     Comments: Tender chest wall /ribs under L breast and lateral to it  No step off or crepitus  No skin change Chest:     Chest wall: Tenderness present.  Genitourinary:    Comments: Breast exam L :  No mass, nodules, thickening, tenderness, bulging, retraction, inflamation, nipple discharge or skin changes noted.  No axillary or clavicular LA.      Musculoskeletal:        General: Tenderness present.     Cervical back: Normal range of motion and neck supple. No rigidity or tenderness.     Comments: Tenderness of L anterior ribs  Lymphadenopathy:     Cervical: No cervical adenopathy.  Skin:    Comments: No rash noted Many SKs - under both breasts bilaterally  Neurological:     Mental Status: She is alert.      Cranial Nerves: No cranial nerve deficit.     Sensory: No sensory deficit.  Psychiatric:        Mood and Affect: Mood normal.           Assessment & Plan:   Problem List Items Addressed This Visit      Other   Chest wall pain - Primary    L sided under breast and radiating around to back (one side only) This corresponds to past shingles and post herpetic neuralgia area  No rash seen today but pt is worried about recurrent zoster  tx with valtrex 1 g tid for 7d  inst to alert [place] if rash develops or symptoms worsen  Also discussed possibility of msk pain from last fall- but the pain did not follow the fall directly and no pleuritic pain  Update if not starting to improve in a week or if worsening   May consider imaging

## 2019-12-07 ENCOUNTER — Telehealth: Payer: Self-pay | Admitting: *Deleted

## 2019-12-07 DIAGNOSIS — N184 Chronic kidney disease, stage 4 (severe): Secondary | ICD-10-CM | POA: Diagnosis not present

## 2019-12-07 DIAGNOSIS — Z7984 Long term (current) use of oral hypoglycemic drugs: Secondary | ICD-10-CM

## 2019-12-07 DIAGNOSIS — G4730 Sleep apnea, unspecified: Secondary | ICD-10-CM

## 2019-12-07 DIAGNOSIS — Z9181 History of falling: Secondary | ICD-10-CM

## 2019-12-07 DIAGNOSIS — F418 Other specified anxiety disorders: Secondary | ICD-10-CM

## 2019-12-07 DIAGNOSIS — Z87440 Personal history of urinary (tract) infections: Secondary | ICD-10-CM

## 2019-12-07 DIAGNOSIS — I129 Hypertensive chronic kidney disease with stage 1 through stage 4 chronic kidney disease, or unspecified chronic kidney disease: Secondary | ICD-10-CM | POA: Diagnosis not present

## 2019-12-07 DIAGNOSIS — M8580 Other specified disorders of bone density and structure, unspecified site: Secondary | ICD-10-CM | POA: Diagnosis not present

## 2019-12-07 DIAGNOSIS — E669 Obesity, unspecified: Secondary | ICD-10-CM

## 2019-12-07 DIAGNOSIS — I700 Atherosclerosis of aorta: Secondary | ICD-10-CM | POA: Diagnosis not present

## 2019-12-07 DIAGNOSIS — E538 Deficiency of other specified B group vitamins: Secondary | ICD-10-CM

## 2019-12-07 DIAGNOSIS — E1122 Type 2 diabetes mellitus with diabetic chronic kidney disease: Secondary | ICD-10-CM | POA: Diagnosis not present

## 2019-12-07 DIAGNOSIS — M1990 Unspecified osteoarthritis, unspecified site: Secondary | ICD-10-CM | POA: Diagnosis not present

## 2019-12-07 DIAGNOSIS — B0223 Postherpetic polyneuropathy: Secondary | ICD-10-CM

## 2019-12-07 DIAGNOSIS — M519 Unspecified thoracic, thoracolumbar and lumbosacral intervertebral disc disorder: Secondary | ICD-10-CM | POA: Diagnosis not present

## 2019-12-07 DIAGNOSIS — E11319 Type 2 diabetes mellitus with unspecified diabetic retinopathy without macular edema: Secondary | ICD-10-CM

## 2019-12-07 DIAGNOSIS — I69354 Hemiplegia and hemiparesis following cerebral infarction affecting left non-dominant side: Secondary | ICD-10-CM | POA: Diagnosis not present

## 2019-12-07 DIAGNOSIS — E785 Hyperlipidemia, unspecified: Secondary | ICD-10-CM

## 2019-12-07 DIAGNOSIS — G8929 Other chronic pain: Secondary | ICD-10-CM | POA: Diagnosis not present

## 2019-12-07 DIAGNOSIS — R32 Unspecified urinary incontinence: Secondary | ICD-10-CM

## 2019-12-07 NOTE — Telephone Encounter (Signed)
Pt and Kendra notified of Dr Marliss Coots comments

## 2019-12-07 NOTE — Telephone Encounter (Signed)
Claritin 10 mg daily (store brand will be cheaper)  It may also help to wear a mask outdoors

## 2019-12-07 NOTE — Telephone Encounter (Signed)
Tillie Rung PT with [facility] left a voicemail stating that patient is having problems with allergies. Tillie Rung stated that patient has been outdoors working in her flowers. Tillie Rung stated that patient is complaining of allergy symptoms, nose stuffiness and itching. Tillie Rung wants to know what allergy medication Dr. Glori Bickers would recommend that she take for allergies? Tillie Rung stated that you can call the patient or call her back.

## 2019-12-09 DIAGNOSIS — M519 Unspecified thoracic, thoracolumbar and lumbosacral intervertebral disc disorder: Secondary | ICD-10-CM | POA: Diagnosis not present

## 2019-12-09 DIAGNOSIS — N184 Chronic kidney disease, stage 4 (severe): Secondary | ICD-10-CM | POA: Diagnosis not present

## 2019-12-09 DIAGNOSIS — B0223 Postherpetic polyneuropathy: Secondary | ICD-10-CM | POA: Diagnosis not present

## 2019-12-09 DIAGNOSIS — M1990 Unspecified osteoarthritis, unspecified site: Secondary | ICD-10-CM | POA: Diagnosis not present

## 2019-12-09 DIAGNOSIS — G8929 Other chronic pain: Secondary | ICD-10-CM | POA: Diagnosis not present

## 2019-12-09 DIAGNOSIS — I129 Hypertensive chronic kidney disease with stage 1 through stage 4 chronic kidney disease, or unspecified chronic kidney disease: Secondary | ICD-10-CM | POA: Diagnosis not present

## 2019-12-09 DIAGNOSIS — I700 Atherosclerosis of aorta: Secondary | ICD-10-CM | POA: Diagnosis not present

## 2019-12-09 DIAGNOSIS — I69354 Hemiplegia and hemiparesis following cerebral infarction affecting left non-dominant side: Secondary | ICD-10-CM | POA: Diagnosis not present

## 2019-12-09 DIAGNOSIS — M8580 Other specified disorders of bone density and structure, unspecified site: Secondary | ICD-10-CM | POA: Diagnosis not present

## 2019-12-09 DIAGNOSIS — E1122 Type 2 diabetes mellitus with diabetic chronic kidney disease: Secondary | ICD-10-CM | POA: Diagnosis not present

## 2019-12-14 DIAGNOSIS — I129 Hypertensive chronic kidney disease with stage 1 through stage 4 chronic kidney disease, or unspecified chronic kidney disease: Secondary | ICD-10-CM | POA: Diagnosis not present

## 2019-12-14 DIAGNOSIS — I700 Atherosclerosis of aorta: Secondary | ICD-10-CM | POA: Diagnosis not present

## 2019-12-14 DIAGNOSIS — M519 Unspecified thoracic, thoracolumbar and lumbosacral intervertebral disc disorder: Secondary | ICD-10-CM | POA: Diagnosis not present

## 2019-12-14 DIAGNOSIS — I69354 Hemiplegia and hemiparesis following cerebral infarction affecting left non-dominant side: Secondary | ICD-10-CM | POA: Diagnosis not present

## 2019-12-14 DIAGNOSIS — M1990 Unspecified osteoarthritis, unspecified site: Secondary | ICD-10-CM | POA: Diagnosis not present

## 2019-12-14 DIAGNOSIS — B0223 Postherpetic polyneuropathy: Secondary | ICD-10-CM | POA: Diagnosis not present

## 2019-12-14 DIAGNOSIS — E1122 Type 2 diabetes mellitus with diabetic chronic kidney disease: Secondary | ICD-10-CM | POA: Diagnosis not present

## 2019-12-14 DIAGNOSIS — M8580 Other specified disorders of bone density and structure, unspecified site: Secondary | ICD-10-CM | POA: Diagnosis not present

## 2019-12-14 DIAGNOSIS — N184 Chronic kidney disease, stage 4 (severe): Secondary | ICD-10-CM | POA: Diagnosis not present

## 2019-12-14 DIAGNOSIS — G8929 Other chronic pain: Secondary | ICD-10-CM | POA: Diagnosis not present

## 2019-12-16 DIAGNOSIS — N184 Chronic kidney disease, stage 4 (severe): Secondary | ICD-10-CM | POA: Diagnosis not present

## 2019-12-16 DIAGNOSIS — E1122 Type 2 diabetes mellitus with diabetic chronic kidney disease: Secondary | ICD-10-CM | POA: Diagnosis not present

## 2019-12-16 DIAGNOSIS — I700 Atherosclerosis of aorta: Secondary | ICD-10-CM | POA: Diagnosis not present

## 2019-12-16 DIAGNOSIS — B0223 Postherpetic polyneuropathy: Secondary | ICD-10-CM | POA: Diagnosis not present

## 2019-12-16 DIAGNOSIS — I69354 Hemiplegia and hemiparesis following cerebral infarction affecting left non-dominant side: Secondary | ICD-10-CM | POA: Diagnosis not present

## 2019-12-16 DIAGNOSIS — I129 Hypertensive chronic kidney disease with stage 1 through stage 4 chronic kidney disease, or unspecified chronic kidney disease: Secondary | ICD-10-CM | POA: Diagnosis not present

## 2019-12-16 DIAGNOSIS — M8580 Other specified disorders of bone density and structure, unspecified site: Secondary | ICD-10-CM | POA: Diagnosis not present

## 2019-12-16 DIAGNOSIS — M519 Unspecified thoracic, thoracolumbar and lumbosacral intervertebral disc disorder: Secondary | ICD-10-CM | POA: Diagnosis not present

## 2019-12-16 DIAGNOSIS — M1990 Unspecified osteoarthritis, unspecified site: Secondary | ICD-10-CM | POA: Diagnosis not present

## 2019-12-16 DIAGNOSIS — G8929 Other chronic pain: Secondary | ICD-10-CM | POA: Diagnosis not present

## 2019-12-21 ENCOUNTER — Telehealth: Payer: Self-pay | Admitting: *Deleted

## 2019-12-21 MED ORDER — VALACYCLOVIR HCL 500 MG PO TABS: 500.0000 mg | ORAL_TABLET | Freq: Every day | ORAL | 0 refills | Status: DC

## 2019-12-21 NOTE — Telephone Encounter (Signed)
Patient called stating that she thinks her shingles are coming back. Patient stated that she is having the same pain on her right side like she was having several weeks ago. Patient stated that the pain was real bad for about 4 hours. Patient stated that there is not a rash, but some redness in the area. Patient stated that she took all of the medication given for shingles and she got better. Patient stated now the symptoms are back again after finishing the medication for shingles. Patient wants to know if more medication can be called in for her? Pharmacy [facility] [id number]

## 2019-12-21 NOTE — Telephone Encounter (Signed)
Pt notified of Dr. Marliss Coots comments/ recommendations. Pt will try med and keep [place] posted

## 2019-12-21 NOTE — Telephone Encounter (Signed)
Shingles does not come back like that (we do one course during the rash and then stop) Herpes simplex however can cause recurrent problems   In addition the high dose is not good for kidneys for longer term   Let's try a maintenance dose ( 500 mg daily) to see if this helps and let me know in about 2 weeks how she is  Do watch for a rash as well

## 2019-12-22 ENCOUNTER — Other Ambulatory Visit: Payer: Self-pay | Admitting: Family Medicine

## 2019-12-22 DIAGNOSIS — M8580 Other specified disorders of bone density and structure, unspecified site: Secondary | ICD-10-CM | POA: Diagnosis not present

## 2019-12-22 DIAGNOSIS — E1122 Type 2 diabetes mellitus with diabetic chronic kidney disease: Secondary | ICD-10-CM | POA: Diagnosis not present

## 2019-12-22 DIAGNOSIS — I700 Atherosclerosis of aorta: Secondary | ICD-10-CM | POA: Diagnosis not present

## 2019-12-22 DIAGNOSIS — M519 Unspecified thoracic, thoracolumbar and lumbosacral intervertebral disc disorder: Secondary | ICD-10-CM | POA: Diagnosis not present

## 2019-12-22 DIAGNOSIS — M1990 Unspecified osteoarthritis, unspecified site: Secondary | ICD-10-CM | POA: Diagnosis not present

## 2019-12-22 DIAGNOSIS — G8929 Other chronic pain: Secondary | ICD-10-CM | POA: Diagnosis not present

## 2019-12-22 DIAGNOSIS — I129 Hypertensive chronic kidney disease with stage 1 through stage 4 chronic kidney disease, or unspecified chronic kidney disease: Secondary | ICD-10-CM | POA: Diagnosis not present

## 2019-12-22 DIAGNOSIS — I69354 Hemiplegia and hemiparesis following cerebral infarction affecting left non-dominant side: Secondary | ICD-10-CM | POA: Diagnosis not present

## 2019-12-22 DIAGNOSIS — N184 Chronic kidney disease, stage 4 (severe): Secondary | ICD-10-CM | POA: Diagnosis not present

## 2019-12-22 DIAGNOSIS — B0223 Postherpetic polyneuropathy: Secondary | ICD-10-CM | POA: Diagnosis not present

## 2019-12-23 ENCOUNTER — Telehealth: Payer: Self-pay | Admitting: Family Medicine

## 2019-12-23 DIAGNOSIS — R69 Illness, unspecified: Secondary | ICD-10-CM | POA: Diagnosis not present

## 2019-12-23 NOTE — Telephone Encounter (Signed)
Name of Medication:Xanax Name of Pharmacy:[facility] [place] or Written Date and Quantity:11/19/19#60 tabs with 0 refills Last Office Visit and Type:Hospital f/u 11/05/19 Next Office Visit and Type: 6 month f/u on 01/05/20 Last Controlled Substance Agreement Date:09/12/16 Last UDS:09/12/16

## 2019-12-23 NOTE — Telephone Encounter (Signed)
I think I already refilled that today

## 2019-12-23 NOTE — Telephone Encounter (Signed)
Renee [name] (DPR not signed) left v/m requesting cb to pt about status of Xanax refill. Pt has 3 pills left.

## 2019-12-23 NOTE — Telephone Encounter (Signed)
Pt notified Rx was refilled today

## 2019-12-25 ENCOUNTER — Telehealth: Payer: Self-pay

## 2019-12-25 DIAGNOSIS — I69354 Hemiplegia and hemiparesis following cerebral infarction affecting left non-dominant side: Secondary | ICD-10-CM | POA: Diagnosis not present

## 2019-12-25 DIAGNOSIS — N184 Chronic kidney disease, stage 4 (severe): Secondary | ICD-10-CM | POA: Diagnosis not present

## 2019-12-25 DIAGNOSIS — E1122 Type 2 diabetes mellitus with diabetic chronic kidney disease: Secondary | ICD-10-CM | POA: Diagnosis not present

## 2019-12-25 DIAGNOSIS — I129 Hypertensive chronic kidney disease with stage 1 through stage 4 chronic kidney disease, or unspecified chronic kidney disease: Secondary | ICD-10-CM | POA: Diagnosis not present

## 2019-12-25 DIAGNOSIS — B0223 Postherpetic polyneuropathy: Secondary | ICD-10-CM | POA: Diagnosis not present

## 2019-12-25 DIAGNOSIS — M519 Unspecified thoracic, thoracolumbar and lumbosacral intervertebral disc disorder: Secondary | ICD-10-CM | POA: Diagnosis not present

## 2019-12-25 DIAGNOSIS — M1990 Unspecified osteoarthritis, unspecified site: Secondary | ICD-10-CM | POA: Diagnosis not present

## 2019-12-25 DIAGNOSIS — I700 Atherosclerosis of aorta: Secondary | ICD-10-CM | POA: Diagnosis not present

## 2019-12-25 DIAGNOSIS — G8929 Other chronic pain: Secondary | ICD-10-CM | POA: Diagnosis not present

## 2019-12-25 DIAGNOSIS — M8580 Other specified disorders of bone density and structure, unspecified site: Secondary | ICD-10-CM | POA: Diagnosis not present

## 2019-12-25 NOTE — Telephone Encounter (Signed)
Renee [name] PT with [facility] is with pt now, on 12/24/19 pt took valacyclovir 500 mg. After taking medication pt was nauseated, lightheaded,blurred vision and confused. No burning or pain upon urination but did get up x 2 last night to urinate but pt had increased fluids. Now BP 170/72,T 97.2 P 64 and pulse ox 94%. Pt has recently taken the amlodipine 10 mg,metoprolol 25 mg, and Hydralazine 100mg  that Dr Holley Raring prescribes for pt. Pt has not taken valacyclovir today. Pt cannot come into office this afternoon. Dr Glori Bickers said to hold valacyclovir for now; get plenty of rest,drink plenty of fluids, call [facility] with update on 12/28/19; if over weekend pt condition changes or worsens pt is to call 911 and go to ED.Renee [name] voiced understanding and will advise pt. FYI to Dr Glori Bickers.

## 2019-12-28 DIAGNOSIS — E1122 Type 2 diabetes mellitus with diabetic chronic kidney disease: Secondary | ICD-10-CM | POA: Diagnosis not present

## 2019-12-28 DIAGNOSIS — M8580 Other specified disorders of bone density and structure, unspecified site: Secondary | ICD-10-CM | POA: Diagnosis not present

## 2019-12-28 DIAGNOSIS — I129 Hypertensive chronic kidney disease with stage 1 through stage 4 chronic kidney disease, or unspecified chronic kidney disease: Secondary | ICD-10-CM | POA: Diagnosis not present

## 2019-12-28 DIAGNOSIS — M519 Unspecified thoracic, thoracolumbar and lumbosacral intervertebral disc disorder: Secondary | ICD-10-CM | POA: Diagnosis not present

## 2019-12-28 DIAGNOSIS — B0223 Postherpetic polyneuropathy: Secondary | ICD-10-CM | POA: Diagnosis not present

## 2019-12-28 DIAGNOSIS — I69354 Hemiplegia and hemiparesis following cerebral infarction affecting left non-dominant side: Secondary | ICD-10-CM | POA: Diagnosis not present

## 2019-12-28 DIAGNOSIS — M1990 Unspecified osteoarthritis, unspecified site: Secondary | ICD-10-CM | POA: Diagnosis not present

## 2019-12-28 DIAGNOSIS — G8929 Other chronic pain: Secondary | ICD-10-CM | POA: Diagnosis not present

## 2019-12-28 DIAGNOSIS — I700 Atherosclerosis of aorta: Secondary | ICD-10-CM | POA: Diagnosis not present

## 2019-12-28 DIAGNOSIS — N184 Chronic kidney disease, stage 4 (severe): Secondary | ICD-10-CM | POA: Diagnosis not present

## 2019-12-30 DIAGNOSIS — M8580 Other specified disorders of bone density and structure, unspecified site: Secondary | ICD-10-CM | POA: Diagnosis not present

## 2019-12-30 DIAGNOSIS — B0223 Postherpetic polyneuropathy: Secondary | ICD-10-CM | POA: Diagnosis not present

## 2019-12-30 DIAGNOSIS — I129 Hypertensive chronic kidney disease with stage 1 through stage 4 chronic kidney disease, or unspecified chronic kidney disease: Secondary | ICD-10-CM | POA: Diagnosis not present

## 2019-12-30 DIAGNOSIS — E1122 Type 2 diabetes mellitus with diabetic chronic kidney disease: Secondary | ICD-10-CM | POA: Diagnosis not present

## 2019-12-30 DIAGNOSIS — I69354 Hemiplegia and hemiparesis following cerebral infarction affecting left non-dominant side: Secondary | ICD-10-CM | POA: Diagnosis not present

## 2019-12-30 DIAGNOSIS — N184 Chronic kidney disease, stage 4 (severe): Secondary | ICD-10-CM | POA: Diagnosis not present

## 2019-12-30 DIAGNOSIS — M519 Unspecified thoracic, thoracolumbar and lumbosacral intervertebral disc disorder: Secondary | ICD-10-CM | POA: Diagnosis not present

## 2019-12-30 DIAGNOSIS — R69 Illness, unspecified: Secondary | ICD-10-CM | POA: Diagnosis not present

## 2019-12-30 DIAGNOSIS — M1990 Unspecified osteoarthritis, unspecified site: Secondary | ICD-10-CM | POA: Diagnosis not present

## 2019-12-30 DIAGNOSIS — I700 Atherosclerosis of aorta: Secondary | ICD-10-CM | POA: Diagnosis not present

## 2019-12-30 DIAGNOSIS — G8929 Other chronic pain: Secondary | ICD-10-CM | POA: Diagnosis not present

## 2020-01-04 DIAGNOSIS — G8929 Other chronic pain: Secondary | ICD-10-CM | POA: Diagnosis not present

## 2020-01-04 DIAGNOSIS — E1122 Type 2 diabetes mellitus with diabetic chronic kidney disease: Secondary | ICD-10-CM | POA: Diagnosis not present

## 2020-01-04 DIAGNOSIS — I69354 Hemiplegia and hemiparesis following cerebral infarction affecting left non-dominant side: Secondary | ICD-10-CM | POA: Diagnosis not present

## 2020-01-04 DIAGNOSIS — I700 Atherosclerosis of aorta: Secondary | ICD-10-CM | POA: Diagnosis not present

## 2020-01-04 DIAGNOSIS — M8580 Other specified disorders of bone density and structure, unspecified site: Secondary | ICD-10-CM | POA: Diagnosis not present

## 2020-01-04 DIAGNOSIS — M519 Unspecified thoracic, thoracolumbar and lumbosacral intervertebral disc disorder: Secondary | ICD-10-CM | POA: Diagnosis not present

## 2020-01-04 DIAGNOSIS — N184 Chronic kidney disease, stage 4 (severe): Secondary | ICD-10-CM | POA: Diagnosis not present

## 2020-01-04 DIAGNOSIS — M1990 Unspecified osteoarthritis, unspecified site: Secondary | ICD-10-CM | POA: Diagnosis not present

## 2020-01-04 DIAGNOSIS — I129 Hypertensive chronic kidney disease with stage 1 through stage 4 chronic kidney disease, or unspecified chronic kidney disease: Secondary | ICD-10-CM | POA: Diagnosis not present

## 2020-01-04 DIAGNOSIS — B0223 Postherpetic polyneuropathy: Secondary | ICD-10-CM | POA: Diagnosis not present

## 2020-01-05 ENCOUNTER — Ambulatory Visit (INDEPENDENT_AMBULATORY_CARE_PROVIDER_SITE_OTHER): Payer: Medicare HMO | Admitting: Family Medicine

## 2020-01-05 ENCOUNTER — Other Ambulatory Visit: Payer: Self-pay

## 2020-01-05 ENCOUNTER — Encounter: Payer: Self-pay | Admitting: Family Medicine

## 2020-01-05 VITALS — BP 122/60 | HR 82 | Temp 98.1°F | Ht 61.0 in | Wt 189.0 lb

## 2020-01-05 DIAGNOSIS — E1169 Type 2 diabetes mellitus with other specified complication: Secondary | ICD-10-CM | POA: Diagnosis not present

## 2020-01-05 DIAGNOSIS — E785 Hyperlipidemia, unspecified: Secondary | ICD-10-CM | POA: Diagnosis not present

## 2020-01-05 DIAGNOSIS — E11319 Type 2 diabetes mellitus with unspecified diabetic retinopathy without macular edema: Secondary | ICD-10-CM

## 2020-01-05 DIAGNOSIS — I10 Essential (primary) hypertension: Secondary | ICD-10-CM

## 2020-01-05 DIAGNOSIS — N289 Disorder of kidney and ureter, unspecified: Secondary | ICD-10-CM

## 2020-01-05 LAB — COMPREHENSIVE METABOLIC PANEL
ALT: 10 U/L (ref 0–35)
AST: 17 U/L (ref 0–37)
Albumin: 3.9 g/dL (ref 3.5–5.2)
Alkaline Phosphatase: 77 U/L (ref 39–117)
BUN: 25 mg/dL — ABNORMAL HIGH (ref 6–23)
CO2: 28 mEq/L (ref 19–32)
Calcium: 8.6 mg/dL (ref 8.4–10.5)
Chloride: 106 mEq/L (ref 96–112)
Creatinine, Ser: 1.44 mg/dL — ABNORMAL HIGH (ref 0.40–1.20)
GFR: 34.29 mL/min — ABNORMAL LOW (ref >60.00)
Glucose, Bld: 181 mg/dL — ABNORMAL HIGH (ref 70–99)
Potassium: 4.3 mEq/L (ref 3.5–5.1)
Sodium: 140 mEq/L (ref 135–145)
Total Bilirubin: 0.4 mg/dL (ref 0.2–1.2)
Total Protein: 5.7 g/dL — ABNORMAL LOW (ref 6.0–8.3)

## 2020-01-05 LAB — LIPID PANEL
Cholesterol: 197 mg/dL (ref 0–200)
HDL: 35 mg/dL — ABNORMAL LOW (ref >39.00)
LDL Cholesterol: 122 mg/dL — ABNORMAL HIGH (ref 0–99)
NonHDL: 162.45
Total CHOL/HDL Ratio: 6
Triglycerides: 200 mg/dL — ABNORMAL HIGH (ref 0.0–149.0)
VLDL: 40 mg/dL (ref 0.0–40.0)

## 2020-01-05 LAB — HEMOGLOBIN A1C: Hgb A1c MFr Bld: 6.1 % (ref 4.6–6.5)

## 2020-01-05 NOTE — Progress Notes (Signed)
Subjective:    Patient ID: Renee [name], female    DOB: 04/11/1929, 86 y.o.   MRN: [id number]  This visit occurred during the SARS-CoV-2 public health emergency.  Safety protocols were in place, including screening questions prior to the visit, additional usage of staff PPE, and extensive cleaning of exam room while observing appropriate contact time as indicated for disinfecting solutions.    HPI  Pt presents for f/u of chronic medical problems  Wt Readings from Last 3 Encounters:  01/05/20 189 lb (85.7 kg)  12/01/19 184 lb 8 oz (83.7 kg)  11/27/19 183 lb 9 oz (83.3 kg)   35.71 kg/m  Up 5 lb  Appetite is good  Trying to eat healthy   Eating strawberries a lot lately  PT for exercise   Also eating fresh broccoli    Pain in the chest wall went away   PT- getting around slowly/ has 2 more weeks of it    bp is stable today  No cp or palpitations or headaches or edema  No side effects to medicines  BP Readings from Last 3 Encounters:  01/05/20 (!) 142/58  12/01/19 (!) 144/68  11/27/19 125/62     Pulse Readings from Last 3 Encounters:  01/05/20 82  12/01/19 76  11/27/19 83    DM2 with retinopathy  Lab Results  Component Value Date   HGBA1C 6.0 03/30/2019   Needs A1C Before meals her A1C has been in the 140s  No hypoglycemia  Taking ace  Has retinal specialist appt in June     Renal insuff  Lab Results  Component Value Date   CREATININE 1.39 (H) 10/31/2019   BUN 19 10/31/2019   NA 140 10/31/2019   K 4.1 10/31/2019   CL 107 10/31/2019   CO2 25 10/31/2019    This was improved from previous  Is trying to drink lots of water   Hyperlipidemia Lab Results  Component Value Date   CHOL 188 03/30/2019   HDL 41.50 03/30/2019   LDLCALC 119 (H) 03/30/2019   LDLDIRECT 96.0 09/30/2018   TRIG 141.0 03/30/2019   CHOLHDL 5 03/30/2019   On gemfibrozil  No beef  No fried foods    Due for annual exam after aug 10th   Patient Active Problem List   Diagnosis Date Noted  . Fall involving sidewalk curb 11/29/2019  . Contusion of back 11/27/2019  . Hyperlipidemia associated with type 2 diabetes mellitus ([place])   . Aortic atherosclerosis ([place]) 07/13/2019  . H/O sepsis 07/01/2019  . CKD (chronic kidney disease) stage 4, GFR 15-29 ml/min (HCC) 07/01/2019  . Lower abdominal pain 05/11/2019  . Routine general medical examination at a health care facility 03/30/2019  . External hemorrhoid 01/17/2018  . History of colitis 01/17/2018  . Generalized weakness 12/24/2016  . Diabetic retinopathy ([place]) 12/11/2016  . History of CVA (cerebrovascular accident) 12/11/2016  . Estrogen deficiency 08/30/2015  . Encounter for Medicare annual wellness exam 04/17/2013  . Chest wall pain 04/07/2013  . Mobility impaired 06/26/2011  . History of retinal detachment 01/10/2011  . Sleep apnea 11/28/2010  . Depression with anxiety 08/25/2010  . Hemiplegia, late effect of cerebrovascular disease ([place]) 07/05/2010  . POSTHERPETIC NEURALGIA 11/09/2009  . Renal insufficiency 06/29/2008  . Chronic back pain 01/26/2008  . EDEMA 01/26/2008  . B12 deficiency 01/10/2007  . Type 2 diabetes, controlled, with retinopathy ([place]) 11/27/2006  . Essential hypertension 11/27/2006  . FIBROCYSTIC BREAST DISEASE 11/27/2006  . ROSACEA 11/27/2006  .  OSTEOARTHRITIS 11/27/2006  . URINARY INCONTINENCE, MIXED 11/27/2006   Past Medical History:  Diagnosis Date  . Angioedema    possibly from voltaren  . Bronchopneumonia 12/11/2016  . Degenerative disc disease   . Diabetes mellitus    type II  . Hyperlipidemia   . Hypertension   . LVH (left ventricular hypertrophy)    and atrial enlargement by echo in past with nl EF  . Nasal pruritis   . Osteoarthritis   . Osteopenia   . Renal insufficiency   . Sleep apnea   . Stroke [facility]) 05/2010   Small vessel sobcortical (in [place] trial) with Dr Leonie Man, residual L hemiparesis  . Vitamin B 12 deficiency 04/08   Past Surgical History:    Procedure Laterality Date  . ABDOMINAL HYSTERECTOMY     BSO-fibroids  . APPENDECTOMY    . BACK SURGERY    . COLON SURGERY     due to punctured intestines  . EYE SURGERY     cataract extraction  . KNEE SURGERY     arthroscope  . PARS PLANA VITRECTOMY  07/31/2011   Procedure: PARS PLANA VITRECTOMY WITH 25 GAUGE;  Surgeon: [name], MD;  Location: [place];  Service: Ophthalmology;  Laterality: Right;  REMOVAL OF SILICONE OIL AND LASER RIGHT EYE  . RETINAL DETACHMENT SURGERY  02/18/11   times 2  . SPINE SURGERY  08/09   spinal decompression surgery   Social History   Tobacco Use  . Smoking status: Never Smoker  . Smokeless tobacco: Never Used  Substance Use Topics  . Alcohol use: No    Alcohol/week: 0.0 standard drinks  . Drug use: No   Family History  Problem Relation Age of Onset  . COPD Brother   . Cancer Sister        brain tumor with hemmorhage  . Heart disease Sister        CAD   Allergies  Allergen Reactions  . Diclofenac Sodium Other (See Comments)    REACTION: angioedema  . Pioglitazone Swelling   Current Outpatient Medications on File Prior to Visit  Medication Sig Dispense Refill  . acetaminophen (TYLENOL) 500 MG tablet Take 1,000 mg by mouth at bedtime.     Marland Kitchen albuterol (VENTOLIN HFA) 108 (90 Base) MCG/ACT inhaler Inhale 2 puffs into the lungs every 4 (four) hours as needed for wheezing or shortness of breath. 18 g 3  . ALPRAZolam (XANAX) 0.5 MG tablet TAKE 1/2 TO 1 TABLET BY MOUTH TWICE A DAY AS NEEDED FOR ANXIETY 60 tablet 0  . amLODipine (NORVASC) 10 MG tablet Take 1 tablet (10 mg total) by mouth daily. 90 tablet 3  . aspirin 325 MG tablet Take 325 mg by mouth daily.     . Blood Glucose Monitoring Suppl (ONE TOUCH ULTRA 2) w/Device KIT Check blood sugar once daily and as directed. Dx E11.9 1 each 0  . calcium-vitamin D (OSCAL WITH D) 500-200 MG-UNIT per tablet Take 1 tablet by mouth daily.     . Cholecalciferol (VITAMIN D) 1000 UNITS capsule Take  1,000 Units by mouth daily.     Marland Kitchen docusate sodium (COLACE) 100 MG capsule Take 200 mg by mouth at bedtime.    . ferrous sulfate 325 (65 FE) MG tablet Take 325 mg by mouth 2 (two) times daily.    Marland Kitchen gemfibrozil (LOPID) 600 MG tablet TAKE 1 TABLET BY MOUTH EVERY DAY (Patient taking differently: Take 600 mg by mouth daily. ) 90 tablet 2  .  glipiZIDE (GLUCOTROL XL) 2.5 MG 24 hr tablet TAKE 1 TABLET BY MOUTH EVERY DAY WITH BREAKFAST (Patient taking differently: Take 2.5 mg by mouth daily with breakfast. ) 90 tablet 3  . hydrALAZINE (APRESOLINE) 100 MG tablet Take 100 mg by mouth 3 (three) times daily.     . metoprolol succinate (TOPROL-XL) 25 MG 24 hr tablet Take 0.5 tablets (12.5 mg total) by mouth 2 (two) times daily. 90 tablet 3  . mirtazapine (REMERON) 15 MG tablet TAKE 1/2 TABLET BY MOUTH AT BEDTIME (Patient taking differently: Take 7.5 mg by mouth at bedtime. ) 45 tablet 3  . Multiple Vitamin (MULTIVITAMIN) capsule Take 1 capsule by mouth daily.     . Olopatadine HCl (PATADAY OP) Place 1 drop into both eyes daily.    Glory Rosebush Delica Lancets 67M MISC CHECK BLOOD SUGAR ONCE DAILY AND AS DIRECTED. DX E11.9 (Patient taking differently: 1 Device by Other route daily. ) 100 each 2  . ONETOUCH ULTRA test strip CHECK BLOOD SUGAR ONCE DAILY AND AS DIRECTED. DX E11.9 100 strip 1  . Polyvinyl Alcohol-Povidone (REFRESH OP) Place 1 drop into both eyes daily as needed (dry eyes).    . sertraline (ZOLOFT) 50 MG tablet TAKE 1 TABLET BY MOUTH EVERY DAY (Patient taking differently: Take 50 mg by mouth daily. ) 90 tablet 2  . trandolapril (MAVIK) 4 MG tablet Take 1 tablet (4 mg total) by mouth daily. 90 tablet 3  . valACYclovir (VALTREX) 500 MG tablet Take 1 tablet (500 mg total) by mouth daily. 30 tablet 0  . vitamin B-12 (CYANOCOBALAMIN) 1000 MCG tablet Take 1,000 mcg by mouth daily.      No current facility-administered medications on file prior to visit.     Review of Systems  Constitutional: Negative for  activity change, appetite change, fatigue, fever and unexpected weight change.  HENT: Negative for congestion, ear pain, rhinorrhea, sinus pressure and sore throat.   Eyes: Negative for pain, redness and visual disturbance.  Respiratory: Negative for cough, shortness of breath and wheezing.   Cardiovascular: Negative for chest pain and palpitations.  Gastrointestinal: Negative for abdominal pain, blood in stool, constipation and diarrhea.  Endocrine: Negative for polydipsia and polyuria.  Genitourinary: Negative for dysuria, frequency and urgency.  Musculoskeletal: Positive for back pain. Negative for arthralgias and myalgias.  Skin: Negative for pallor and rash.  Allergic/Immunologic: Negative for environmental allergies.  Neurological: Positive for weakness. Negative for dizziness, syncope and headaches.       No neuro changes from baseline   Hematological: Negative for adenopathy. Does not bruise/bleed easily.  Psychiatric/Behavioral: Negative for decreased concentration and dysphoric mood. The patient is not nervous/anxious.        Objective:   Physical Exam Constitutional:      General: She is not in acute distress.    Appearance: Normal appearance. She is well-developed. She is obese. She is not ill-appearing or diaphoretic.  HENT:     Head: Normocephalic and atraumatic.  Eyes:     Conjunctiva/sclera: Conjunctivae normal.     Pupils: Pupils are equal, round, and reactive to light.  Neck:     Thyroid: No thyromegaly.     Vascular: No carotid bruit or JVD.  Cardiovascular:     Rate and Rhythm: Normal rate and regular rhythm.     Heart sounds: Normal heart sounds. No gallop.   Pulmonary:     Effort: Pulmonary effort is normal. No respiratory distress.     Breath sounds: Normal breath sounds. No  wheezing or rales.  Abdominal:     General: Bowel sounds are normal. There is no distension or abdominal bruit.     Palpations: Abdomen is soft. There is no mass.     Tenderness:  There is no abdominal tenderness.  Musculoskeletal:     Cervical back: Normal range of motion and neck supple.  Lymphadenopathy:     Cervical: No cervical adenopathy.  Skin:    General: Skin is warm and dry.     Coloration: Skin is not pale.     Findings: No rash.  Neurological:     Mental Status: She is alert. Mental status is at baseline.     Sensory: No sensory deficit.     Deep Tendon Reflexes: Reflexes are normal and symmetric.     Comments: Walks with walker  Psychiatric:        Mood and Affect: Mood normal.           Assessment & Plan:   Problem List Items Addressed This Visit      Cardiovascular and Mediastinum   Essential hypertension    Better bp on 2nd check  bp in fair control at this time  BP Readings from Last 1 Encounters:  01/05/20 122/60   No changes needed Most recent labs reviewed  Disc lifstyle change with low sodium diet and exercise        Relevant Orders   Lipid panel   Comprehensive metabolic panel     Endocrine   Type 2 diabetes, controlled, with retinopathy ([place]) - Primary    A1C today  Per pt- usually 140s (regardless of eating) for glucose levels No hypoglycemia  Not on statin (due to fibrate)  Enc healthy diet low in processed carbs Enc her to continue PT for exercise        Relevant Orders   Hemoglobin A1c   Diabetic retinopathy ([place])    Per pt-vision is improved  She has retinal specialist appt in june      Hyperlipidemia associated with type 2 diabetes mellitus ([place])    Disc goals for lipids and reasons to control them Rev last labs with pt Rev low sat fat diet in detail Due for labs- (not on statin because she is on a fibrate for triglycerides)       Relevant Orders   Lipid panel     Genitourinary   Renal insufficiency    Labs today  No longer has uti  Drinking water  Has appt with nephrology in July/Dr [facility]

## 2020-01-05 NOTE — Assessment & Plan Note (Signed)
Better bp on 2nd check  bp in fair control at this time  BP Readings from Last 1 Encounters:  01/05/20 122/60   No changes needed Most recent labs reviewed  Disc lifstyle change with low sodium diet and exercise

## 2020-01-05 NOTE — Assessment & Plan Note (Signed)
A1C today  Per pt- usually 140s (regardless of eating) for glucose levels No hypoglycemia  Not on statin (due to fibrate)  Enc healthy diet low in processed carbs Enc her to continue PT for exercise

## 2020-01-05 NOTE — Assessment & Plan Note (Signed)
Per pt-vision is improved  She has retinal specialist appt in june

## 2020-01-05 NOTE — Assessment & Plan Note (Signed)
Disc goals for lipids and reasons to control them Rev last labs with pt Rev low sat fat diet in detail Due for labs- (not on statin because she is on a fibrate for triglycerides)

## 2020-01-05 NOTE — Assessment & Plan Note (Signed)
Labs today  No longer has uti  Drinking water  Has appt with nephrology in July/Dr [facility]

## 2020-01-05 NOTE — Patient Instructions (Signed)
Keep up the therapy -keep getting stronger  Eat a healthy diet  Keep drinking your water   No medicine changes  Labs today - we will contact you with results  Follow up after august 10th for annual exam

## 2020-01-11 DIAGNOSIS — M8580 Other specified disorders of bone density and structure, unspecified site: Secondary | ICD-10-CM | POA: Diagnosis not present

## 2020-01-11 DIAGNOSIS — E1122 Type 2 diabetes mellitus with diabetic chronic kidney disease: Secondary | ICD-10-CM | POA: Diagnosis not present

## 2020-01-11 DIAGNOSIS — M519 Unspecified thoracic, thoracolumbar and lumbosacral intervertebral disc disorder: Secondary | ICD-10-CM | POA: Diagnosis not present

## 2020-01-11 DIAGNOSIS — I69354 Hemiplegia and hemiparesis following cerebral infarction affecting left non-dominant side: Secondary | ICD-10-CM | POA: Diagnosis not present

## 2020-01-11 DIAGNOSIS — N184 Chronic kidney disease, stage 4 (severe): Secondary | ICD-10-CM | POA: Diagnosis not present

## 2020-01-11 DIAGNOSIS — I129 Hypertensive chronic kidney disease with stage 1 through stage 4 chronic kidney disease, or unspecified chronic kidney disease: Secondary | ICD-10-CM | POA: Diagnosis not present

## 2020-01-11 DIAGNOSIS — M1990 Unspecified osteoarthritis, unspecified site: Secondary | ICD-10-CM | POA: Diagnosis not present

## 2020-01-11 DIAGNOSIS — G8929 Other chronic pain: Secondary | ICD-10-CM | POA: Diagnosis not present

## 2020-01-11 DIAGNOSIS — B0223 Postherpetic polyneuropathy: Secondary | ICD-10-CM | POA: Diagnosis not present

## 2020-01-11 DIAGNOSIS — I700 Atherosclerosis of aorta: Secondary | ICD-10-CM | POA: Diagnosis not present

## 2020-01-14 ENCOUNTER — Other Ambulatory Visit: Payer: Self-pay | Admitting: Family Medicine

## 2020-01-14 NOTE — Telephone Encounter (Signed)
What symptoms is she having?

## 2020-01-14 NOTE — Telephone Encounter (Signed)
Dr. Glori Bickers filled Rx on 12/21/19 #30 tabs with 0 refills, please advise

## 2020-01-15 NOTE — Telephone Encounter (Signed)
Pt said this is an auto refill request she didn't request refill. Pt said all her shingle sxs have resolved and she's not taking this med anymore. Rx declined and pt is aware

## 2020-01-18 ENCOUNTER — Other Ambulatory Visit: Payer: Self-pay | Admitting: Family Medicine

## 2020-01-18 DIAGNOSIS — E1122 Type 2 diabetes mellitus with diabetic chronic kidney disease: Secondary | ICD-10-CM | POA: Diagnosis not present

## 2020-01-18 DIAGNOSIS — N184 Chronic kidney disease, stage 4 (severe): Secondary | ICD-10-CM | POA: Diagnosis not present

## 2020-01-18 DIAGNOSIS — M519 Unspecified thoracic, thoracolumbar and lumbosacral intervertebral disc disorder: Secondary | ICD-10-CM | POA: Diagnosis not present

## 2020-01-18 DIAGNOSIS — I700 Atherosclerosis of aorta: Secondary | ICD-10-CM | POA: Diagnosis not present

## 2020-01-18 DIAGNOSIS — G8929 Other chronic pain: Secondary | ICD-10-CM | POA: Diagnosis not present

## 2020-01-18 DIAGNOSIS — I69354 Hemiplegia and hemiparesis following cerebral infarction affecting left non-dominant side: Secondary | ICD-10-CM | POA: Diagnosis not present

## 2020-01-18 DIAGNOSIS — I129 Hypertensive chronic kidney disease with stage 1 through stage 4 chronic kidney disease, or unspecified chronic kidney disease: Secondary | ICD-10-CM | POA: Diagnosis not present

## 2020-01-18 DIAGNOSIS — M8580 Other specified disorders of bone density and structure, unspecified site: Secondary | ICD-10-CM | POA: Diagnosis not present

## 2020-01-18 DIAGNOSIS — B0223 Postherpetic polyneuropathy: Secondary | ICD-10-CM | POA: Diagnosis not present

## 2020-01-18 DIAGNOSIS — M1990 Unspecified osteoarthritis, unspecified site: Secondary | ICD-10-CM | POA: Diagnosis not present

## 2020-01-19 NOTE — Telephone Encounter (Signed)
Name of Medication:Xanax Name of Pharmacy[facility] [place] or Written Date and Quantity:12/23/19 #60 tabs with 0 refills Last Office Visit and Type:6 month f/u on 01/05/20 Next Office Visit and Type:none scheduled  Last Controlled Substance Agreement Date:09/12/16 Last UDS:09/12/16

## 2020-01-25 DIAGNOSIS — M8580 Other specified disorders of bone density and structure, unspecified site: Secondary | ICD-10-CM | POA: Diagnosis not present

## 2020-01-25 DIAGNOSIS — M1990 Unspecified osteoarthritis, unspecified site: Secondary | ICD-10-CM | POA: Diagnosis not present

## 2020-01-25 DIAGNOSIS — M519 Unspecified thoracic, thoracolumbar and lumbosacral intervertebral disc disorder: Secondary | ICD-10-CM | POA: Diagnosis not present

## 2020-01-25 DIAGNOSIS — I129 Hypertensive chronic kidney disease with stage 1 through stage 4 chronic kidney disease, or unspecified chronic kidney disease: Secondary | ICD-10-CM | POA: Diagnosis not present

## 2020-01-25 DIAGNOSIS — E1122 Type 2 diabetes mellitus with diabetic chronic kidney disease: Secondary | ICD-10-CM | POA: Diagnosis not present

## 2020-01-25 DIAGNOSIS — G8929 Other chronic pain: Secondary | ICD-10-CM | POA: Diagnosis not present

## 2020-01-25 DIAGNOSIS — N184 Chronic kidney disease, stage 4 (severe): Secondary | ICD-10-CM | POA: Diagnosis not present

## 2020-01-25 DIAGNOSIS — I69354 Hemiplegia and hemiparesis following cerebral infarction affecting left non-dominant side: Secondary | ICD-10-CM | POA: Diagnosis not present

## 2020-01-25 DIAGNOSIS — B0223 Postherpetic polyneuropathy: Secondary | ICD-10-CM | POA: Diagnosis not present

## 2020-01-25 DIAGNOSIS — I700 Atherosclerosis of aorta: Secondary | ICD-10-CM | POA: Diagnosis not present

## 2020-02-08 ENCOUNTER — Other Ambulatory Visit: Payer: Self-pay

## 2020-02-08 ENCOUNTER — Encounter (INDEPENDENT_AMBULATORY_CARE_PROVIDER_SITE_OTHER): Payer: Medicare HMO | Admitting: Ophthalmology

## 2020-02-08 DIAGNOSIS — H35371 Puckering of macula, right eye: Secondary | ICD-10-CM | POA: Diagnosis not present

## 2020-02-08 DIAGNOSIS — H43813 Vitreous degeneration, bilateral: Secondary | ICD-10-CM | POA: Diagnosis not present

## 2020-02-08 DIAGNOSIS — H353122 Nonexudative age-related macular degeneration, left eye, intermediate dry stage: Secondary | ICD-10-CM

## 2020-02-08 DIAGNOSIS — H338 Other retinal detachments: Secondary | ICD-10-CM

## 2020-02-08 DIAGNOSIS — I10 Essential (primary) hypertension: Secondary | ICD-10-CM | POA: Diagnosis not present

## 2020-02-08 DIAGNOSIS — H35033 Hypertensive retinopathy, bilateral: Secondary | ICD-10-CM | POA: Diagnosis not present

## 2020-02-10 ENCOUNTER — Emergency Department (HOSPITAL_COMMUNITY)
Admission: EM | Admit: 2020-02-10 | Discharge: 2020-02-10 | Disposition: A | Discharge status: Home / Self Care | Payer: Medicare HMO | Source: Home / Self Care | Attending: Emergency Medicine | Admitting: Emergency Medicine

## 2020-02-10 ENCOUNTER — Emergency Department (HOSPITAL_COMMUNITY): Payer: Medicare HMO

## 2020-02-10 ENCOUNTER — Encounter (HOSPITAL_COMMUNITY): Payer: Self-pay | Admitting: Emergency Medicine

## 2020-02-10 DIAGNOSIS — R0902 Hypoxemia: Secondary | ICD-10-CM | POA: Diagnosis not present

## 2020-02-10 DIAGNOSIS — E1122 Type 2 diabetes mellitus with diabetic chronic kidney disease: Secondary | ICD-10-CM | POA: Insufficient documentation

## 2020-02-10 DIAGNOSIS — R0789 Other chest pain: Secondary | ICD-10-CM | POA: Diagnosis not present

## 2020-02-10 DIAGNOSIS — R001 Bradycardia, unspecified: Secondary | ICD-10-CM | POA: Diagnosis not present

## 2020-02-10 DIAGNOSIS — N3000 Acute cystitis without hematuria: Secondary | ICD-10-CM | POA: Insufficient documentation

## 2020-02-10 DIAGNOSIS — R42 Dizziness and giddiness: Secondary | ICD-10-CM

## 2020-02-10 DIAGNOSIS — N184 Chronic kidney disease, stage 4 (severe): Secondary | ICD-10-CM | POA: Insufficient documentation

## 2020-02-10 DIAGNOSIS — Z79899 Other long term (current) drug therapy: Secondary | ICD-10-CM | POA: Insufficient documentation

## 2020-02-10 DIAGNOSIS — Z20822 Contact with and (suspected) exposure to covid-19: Secondary | ICD-10-CM | POA: Diagnosis not present

## 2020-02-10 DIAGNOSIS — Z7984 Long term (current) use of oral hypoglycemic drugs: Secondary | ICD-10-CM | POA: Insufficient documentation

## 2020-02-10 DIAGNOSIS — I10 Essential (primary) hypertension: Secondary | ICD-10-CM | POA: Diagnosis not present

## 2020-02-10 DIAGNOSIS — H7092 Unspecified mastoiditis, left ear: Secondary | ICD-10-CM | POA: Diagnosis not present

## 2020-02-10 DIAGNOSIS — R29810 Facial weakness: Secondary | ICD-10-CM | POA: Diagnosis not present

## 2020-02-10 DIAGNOSIS — G319 Degenerative disease of nervous system, unspecified: Secondary | ICD-10-CM | POA: Diagnosis not present

## 2020-02-10 DIAGNOSIS — I6389 Other cerebral infarction: Secondary | ICD-10-CM | POA: Diagnosis not present

## 2020-02-10 DIAGNOSIS — I129 Hypertensive chronic kidney disease with stage 1 through stage 4 chronic kidney disease, or unspecified chronic kidney disease: Secondary | ICD-10-CM | POA: Insufficient documentation

## 2020-02-10 LAB — URINALYSIS, ROUTINE W REFLEX MICROSCOPIC
Bilirubin Urine: NEGATIVE
Glucose, UA: NEGATIVE mg/dL
Hgb urine dipstick: NEGATIVE
Ketones, ur: NEGATIVE mg/dL
Nitrite: NEGATIVE
Protein, ur: NEGATIVE mg/dL
Specific Gravity, Urine: 1.005 (ref 1.005–1.030)
pH: 6 (ref 5.0–8.0)

## 2020-02-10 LAB — CBC
HCT: 41.1 % (ref 36.0–46.0)
Hemoglobin: 12.7 g/dL (ref 12.0–15.0)
MCH: 29.1 pg (ref 26.0–34.0)
MCHC: 30.9 g/dL (ref 30.0–36.0)
MCV: 94.1 fL (ref 80.0–100.0)
Platelets: 187 10*3/uL (ref 150–400)
RBC: 4.37 MIL/uL (ref 3.87–5.11)
RDW: 13.4 % (ref 11.5–15.5)
WBC: 6.3 10*3/uL (ref 4.0–10.5)
nRBC: 0 % (ref 0.0–0.2)

## 2020-02-10 LAB — CBG MONITORING, ED: Glucose-Capillary: 104 mg/dL — ABNORMAL HIGH (ref 70–99)

## 2020-02-10 LAB — BASIC METABOLIC PANEL
Anion gap: 11 (ref 5–15)
BUN: 18 mg/dL (ref 8–23)
CO2: 25 mmol/L (ref 22–32)
Calcium: 9 mg/dL (ref 8.9–10.3)
Chloride: 105 mmol/L (ref 98–111)
Creatinine, Ser: 1.42 mg/dL — ABNORMAL HIGH (ref 0.44–1.00)
GFR calc Af Amer: 38 mL/min — ABNORMAL LOW (ref >60)
GFR calc non Af Amer: 33 mL/min — ABNORMAL LOW (ref >60)
Glucose, Bld: 135 mg/dL — ABNORMAL HIGH (ref 70–99)
Potassium: 4.5 mmol/L (ref 3.5–5.1)
Sodium: 141 mmol/L (ref 135–145)

## 2020-02-10 MED ORDER — FOSFOMYCIN TROMETHAMINE 3 G PO PACK
3.0000 g | PACK | Freq: Once | ORAL | Status: AC
  Administered 2020-02-10: 3 g via ORAL
  Filled 2020-02-10: qty 3

## 2020-02-10 MED ORDER — SODIUM CHLORIDE 0.9% FLUSH: 3.0000 mL | Freq: Once | INTRAVENOUS | Status: DC

## 2020-02-10 MED ORDER — HYDRALAZINE HCL 25 MG PO TABS
100.0000 mg | ORAL_TABLET | Freq: Once | ORAL | Status: AC
  Administered 2020-02-10: 100 mg via ORAL
  Filled 2020-02-10: qty 4

## 2020-02-10 MED ORDER — SODIUM CHLORIDE 0.9 % IV BOLUS
500.0000 mL | Freq: Once | INTRAVENOUS | Status: AC
  Administered 2020-02-10: 500 mL via INTRAVENOUS

## 2020-02-10 NOTE — ED Notes (Signed)
Pt's family reports pt felt like she "blacked out a few minutes ago". Pt a/o to baseline. Dr. Kathrynn Humble aware.

## 2020-02-10 NOTE — ED Triage Notes (Addendum)
Pt arrives via [facility] from home for c/o dizziness that began at 9am yesterday morning. States that dizziness gets better upon sitting but starts back up upon standing. Hx of vertigo and stroke with L sided deficits from previous stroke. Pt a/ox4, mildly slurred speech, L sided droop and L sided weakness are baseline for her.

## 2020-02-10 NOTE — ED Notes (Signed)
CBG Results of 104 reported to [place], Therapist, sports.

## 2020-02-10 NOTE — ED Provider Notes (Signed)
[facility] EMERGENCY DEPARTMENT Provider Note   CSN: [id number] Arrival date & time: 02/10/20  1112     History Chief Complaint  Patient presents with  . Dizziness    Renee [name] is a 86 y.o. female with past medical history significant for prior angioedema, degenerative disc disease, type 2 diabetes, hypertension, hyperlipidemia, LVH, osteopenia, osteoarthritis, CVA with residual left hemiparesis.  She takes aspirin daily, no other anticoagulation.  HPI Patient presents to emergency department today via EMS with chief complaint of dizziness x1 day.  She states that started approximately 9 AM yesterday morning.  Patient states she sat up quickly yesterday morning to get up to open the door for her caregiver.  She states when she first sat up she had sudden onset of room spinning sensation.  She sat there for several minutes and dizziness resolved.  She states she went on about her day and it was normal.  She states the dizziness returned this morning.  At this time she was sitting in her recliner about to get ready for her hair appointment.  She again describes it as a room spinning sensation.  Patient states this time the symptoms continued and she was feeling off balance when walking today so she came to the emergency department for further evaluation.  She is also endorsing dysuria and urinary frequency for the last x3 days.  She has a history of urinary tract infections.  She also states she has a history of vertigo.  She states she has not had any episodes in quite a while so she does not remember exactly if this is how it feels.  She denies any recent head injury, trauma or fall.  She denies any fever, chills, neck pain, headache, visual changes, chest pain, shortness of breath, abdominal pain, back pain, nausea, vomiting, pelvic pain, abnormal vaginal bleeding or vaginal discharge.  She had her annual eye doctor appointment x 3 days ago that was normal per sister-in-law  at the bedside.    Past Medical History:  Diagnosis Date  . Angioedema    possibly from voltaren  . Bronchopneumonia 12/11/2016  . Degenerative disc disease   . Diabetes mellitus    type II  . Hyperlipidemia   . Hypertension   . LVH (left ventricular hypertrophy)    and atrial enlargement by echo in past with nl EF  . Nasal pruritis   . Osteoarthritis   . Osteopenia   . Renal insufficiency   . Sleep apnea   . Stroke [facility]) 05/2010   Small vessel sobcortical (in [place] trial) with Dr Leonie Man, residual L hemiparesis  . Vitamin B 12 deficiency 04/08    Patient Active Problem List   Diagnosis Date Noted  . Fall involving sidewalk curb 11/29/2019  . Contusion of back 11/27/2019  . Hyperlipidemia associated with type 2 diabetes mellitus ([place])   . Aortic atherosclerosis ([place]) 07/13/2019  . H/O sepsis 07/01/2019  . CKD (chronic kidney disease) stage 4, GFR 15-29 ml/min (HCC) 07/01/2019  . Lower abdominal pain 05/11/2019  . Routine general medical examination at a health care facility 03/30/2019  . External hemorrhoid 01/17/2018  . History of colitis 01/17/2018  . Generalized weakness 12/24/2016  . Diabetic retinopathy ([place]) 12/11/2016  . History of CVA (cerebrovascular accident) 12/11/2016  . Estrogen deficiency 08/30/2015  . Encounter for Medicare annual wellness exam 04/17/2013  . Chest wall pain 04/07/2013  . Mobility impaired 06/26/2011  . History of retinal detachment 01/10/2011  . Sleep apnea  11/28/2010  . Depression with anxiety 08/25/2010  . Hemiplegia, late effect of cerebrovascular disease ([place]) 07/05/2010  . POSTHERPETIC NEURALGIA 11/09/2009  . Renal insufficiency 06/29/2008  . Chronic back pain 01/26/2008  . EDEMA 01/26/2008  . B12 deficiency 01/10/2007  . Type 2 diabetes, controlled, with retinopathy ([place]) 11/27/2006  . Essential hypertension 11/27/2006  . FIBROCYSTIC BREAST DISEASE 11/27/2006  . ROSACEA 11/27/2006  . OSTEOARTHRITIS 11/27/2006  . URINARY  INCONTINENCE, MIXED 11/27/2006    Past Surgical History:  Procedure Laterality Date  . ABDOMINAL HYSTERECTOMY     BSO-fibroids  . APPENDECTOMY    . BACK SURGERY    . COLON SURGERY     due to punctured intestines  . EYE SURGERY     cataract extraction  . KNEE SURGERY     arthroscope  . PARS PLANA VITRECTOMY  07/31/2011   Procedure: PARS PLANA VITRECTOMY WITH 25 GAUGE;  Surgeon: [name], MD;  Location: [place];  Service: Ophthalmology;  Laterality: Right;  REMOVAL OF SILICONE OIL AND LASER RIGHT EYE  . RETINAL DETACHMENT SURGERY  02/18/11   times 2  . SPINE SURGERY  08/09   spinal decompression surgery     OB History   No obstetric history on file.     Family History  Problem Relation Age of Onset  . COPD Brother   . Cancer Sister        brain tumor with hemmorhage  . Heart disease Sister        CAD    Social History   Tobacco Use  . Smoking status: Never Smoker  . Smokeless tobacco: Never Used  Substance Use Topics  . Alcohol use: No    Alcohol/week: 0.0 standard drinks  . Drug use: No    Home Medications Prior to Admission medications   Medication Sig Start Date End Date Taking? Authorizing Provider  acetaminophen (TYLENOL) 500 MG tablet Take 1,000 mg by mouth at bedtime.     [provider]  albuterol (VENTOLIN HFA) 108 (90 Base) MCG/ACT inhaler Inhale 2 puffs into the lungs every 4 (four) hours as needed for wheezing or shortness of breath. 07/13/19   Tower, Wynelle Fanny, MD  ALPRAZolam [name]) 0.5 MG tablet TAKE 1/2 TO 1 TABLET BY MOUTH TWICE A DAY AS NEEDED FOR ANXIETY 01/19/20   Tower, Wynelle Fanny, MD  amLODipine (NORVASC) 10 MG tablet Take 1 tablet (10 mg total) by mouth daily. 09/30/18   Tower, Wynelle Fanny, MD  aspirin 325 MG tablet Take 325 mg by mouth daily.     [provider]  Blood Glucose Monitoring Suppl (ONE TOUCH ULTRA 2) w/Device KIT Check blood sugar once daily and as directed. Dx E11.9 08/27/18   Tower, Wynelle Fanny, MD  calcium-vitamin D  (OSCAL WITH D) 500-200 MG-UNIT per tablet Take 1 tablet by mouth daily.     [provider]  Cholecalciferol (VITAMIN D) 1000 UNITS capsule Take 1,000 Units by mouth daily.     [provider]  docusate sodium (COLACE) 100 MG capsule Take 200 mg by mouth at bedtime.    [provider]  ferrous sulfate 325 (65 FE) MG tablet Take 325 mg by mouth 2 (two) times daily.    [provider]  gemfibrozil (LOPID) 600 MG tablet TAKE 1 TABLET BY MOUTH EVERY DAY Patient taking differently: Take 600 mg by mouth daily.  09/08/19   Tower, Marne A, MD  glipiZIDE (GLUCOTROL XL) 2.5 MG 24 hr tablet TAKE 1 TABLET  BY MOUTH EVERY DAY WITH BREAKFAST Patient taking differently: Take 2.5 mg by mouth daily with breakfast.  07/20/19   Tower, Wynelle Fanny, MD  hydrALAZINE (APRESOLINE) 100 MG tablet Take 100 mg by mouth 3 (three) times daily.  08/30/14   [provider]  metoprolol succinate (TOPROL-XL) 25 MG 24 hr tablet Take 0.5 tablets (12.5 mg total) by mouth 2 (two) times daily. 09/30/18   Tower, Wynelle Fanny, MD  mirtazapine (REMERON) 15 MG tablet TAKE 1/2 TABLET BY MOUTH AT BEDTIME Patient taking differently: Take 7.5 mg by mouth at bedtime.  06/16/19   Tower, Wynelle Fanny, MD  Multiple Vitamin (MULTIVITAMIN) capsule Take 1 capsule by mouth daily.     [provider]  Olopatadine HCl (PATADAY OP) Place 1 drop into both eyes daily.    [provider]  OneTouch Delica Lancets 80K MISC CHECK BLOOD SUGAR ONCE DAILY AND AS DIRECTED. DX E11.9 Patient taking differently: 1 Device by Other route daily.  10/23/19   Tower, Wynelle Fanny, MD  ONETOUCH ULTRA test strip CHECK BLOOD SUGAR ONCE DAILY AND AS DIRECTED. DX E11.9 12/22/19   Tower, Wynelle Fanny, MD  Polyvinyl Alcohol-Povidone (REFRESH OP) Place 1 drop into both eyes daily as needed (dry eyes).    [provider]  sertraline (ZOLOFT) 50 MG tablet TAKE 1 TABLET BY MOUTH EVERY DAY Patient taking differently: Take 50 mg by mouth daily.   09/08/19   Tower, Wynelle Fanny, MD  trandolapril (MAVIK) 4 MG tablet Take 1 tablet (4 mg total) by mouth daily. 09/30/18   Tower, Wynelle Fanny, MD  valACYclovir (VALTREX) 500 MG tablet Take 1 tablet (500 mg total) by mouth daily. 12/21/19   Tower, Wynelle Fanny, MD  vitamin B-12 (CYANOCOBALAMIN) 1000 MCG tablet Take 1,000 mcg by mouth daily.     [provider]    Allergies    Diclofenac sodium and Pioglitazone  Review of Systems   Review of Systems All other systems are reviewed and are negative for acute change except as noted in the HPI.  Physical Exam Updated Vital Signs BP (!) 172/72 (BP Location: Right Arm)   Pulse 61   Temp 97.6 F (36.4 C) (Oral)   Resp 14   Ht 5' 2"  (1.575 m)   Wt 81.6 kg   SpO2 93%   BMI 32.92 kg/m   Physical Exam Vitals and nursing note reviewed.  Constitutional:      General: She is not in acute distress.    Appearance: She is not ill-appearing.  HENT:     Head: Normocephalic and atraumatic.     Right Ear: Tympanic membrane and external ear normal.     Left Ear: Tympanic membrane and external ear normal.     Nose: Nose normal.     Mouth/Throat:     Mouth: Mucous membranes are moist.     Pharynx: Oropharynx is clear.  Eyes:     General: No scleral icterus.       Right eye: No discharge.        Left eye: No discharge.     Extraocular Movements: Extraocular movements intact.     Conjunctiva/sclera: Conjunctivae normal.     Pupils: Pupils are equal, round, and reactive to light.     Comments: No nystagmus  Neck:     Vascular: No JVD.  Cardiovascular:     Rate and Rhythm: Normal rate and regular rhythm.     Pulses: Normal pulses.  Radial pulses are 2+ on the right side and 2+ on the left side.     Heart sounds: Normal heart sounds.  Pulmonary:     Comments: Lungs clear to auscultation in all fields. Symmetric chest rise. No wheezing, rales, or rhonchi. Abdominal:     Tenderness: There is no right CVA tenderness or left CVA tenderness.      Comments: Abdomen is soft, non-distended, and non-tender in all quadrants. No rigidity, no guarding. No peritoneal signs.  Musculoskeletal:        General: Normal range of motion.     Cervical back: Normal range of motion.  Skin:    General: Skin is warm and dry.     Capillary Refill: Capillary refill takes less than 2 seconds.  Neurological:     Mental Status: She is oriented to person, place, and time.     GCS: GCS eye subscore is 4. GCS verbal subscore is 5. GCS motor subscore is 6.     Comments: Speech is slurred and at baseline per family at the bedside. Speech is goal oriented and she does follow commands. CN 2-12 grossly intact. Minimal left sided mouth droop also baseline per family. Decreased ROM of left upper extremity from previous stroke. Grip strength 4/5 left upper extremity and 5/5 on right upper extremity. Bilateral lower extremities with 5/5 strength. Sensation normal to light and sharp touch Moves extremities without ataxia, coordination intact Normal finger to nose and rapid alternating movements    Psychiatric:        Behavior: Behavior normal.       ED Results / Procedures / Treatments   Labs (all labs ordered are listed, but only abnormal results are displayed) Labs Reviewed  BASIC METABOLIC PANEL - Abnormal; Notable for the following components:      Result Value   Glucose, Bld 135 (*)    Creatinine, Ser 1.42 (*)    GFR calc non Af Amer 33 (*)    GFR calc Af Amer 38 (*)    All other components within normal limits  URINALYSIS, ROUTINE W REFLEX MICROSCOPIC - Abnormal; Notable for the following components:   APPearance HAZY (*)    Leukocytes,Ua LARGE (*)    Bacteria, UA MANY (*)    All other components within normal limits  CBG MONITORING, ED - Abnormal; Notable for the following components:   Glucose-Capillary 104 (*)    All other components within normal limits  URINE CULTURE  CBC    EKG None  Radiology CT Head Wo Contrast  Result Date:  02/10/2020 CLINICAL DATA:  Dizziness. EXAM: CT HEAD WITHOUT CONTRAST TECHNIQUE: Contiguous axial images were obtained from the base of the skull through the vertex without intravenous contrast. COMPARISON:  October 31, 2010 FINDINGS: Brain: There is mild to moderate severity cerebral atrophy with widening of the extra-axial spaces and ventricular dilatation. There are areas of decreased attenuation within the white matter tracts of the supratentorial brain, consistent with microvascular disease changes. A chronic right basal ganglia lacunar infarct is seen. This is present on the prior study. Vascular: No hyperdense vessel or unexpected calcification. Skull: Normal. Negative for fracture or focal lesion. Sinuses/Orbits: No acute finding. Evidence of prior scleral banding is noted on the right. Other: None. IMPRESSION: 1. Generalized cerebral atrophy. 2. Chronic right basal ganglia lacunar infarct. 3. No acute intracranial abnormality. Electronically Signed   By: Virgina Norfolk M.D.   On: 02/10/2020 20:27    Procedures Procedures (including critical care time)  Medications  Ordered in ED Medications  sodium chloride flush (NS) 0.9 % injection 3 mL (3 mLs Intravenous Not Given 02/10/20 1948)  fosfomycin (MONUROL) packet 3 g (has no administration in time range)  sodium chloride 0.9 % bolus 500 mL (0 mLs Intravenous Stopped 02/10/20 2050)    ED Course  I have reviewed the triage vital signs and the nursing notes.  Pertinent labs & imaging results that were available during my care of the patient were reviewed by me and considered in my medical decision making (see chart for details).    MDM Rules/Calculators/A&P                          History provided by patient and family with additional history obtained from chart review.    86 yo female presenting with dizziness x 2 days. Patient presents awake, alert, hemodynamically stable, afebrile, non toxic. On exam she has slightly slurred speech and  facial droop which is her baseline per family. She is alert and oriented, follows simple commands. CN 2-12 intact. She has minimal left sided weakness compared to the right. She has no nystagmus. She admits to dizziness when changing positions from laying to sitting upright in bed.   Labs were collected in triage. I viewed results which show no leukocytosis, no anemia, no severe electrolyte derangement, creatinine consistent with her baseline. EKG without ischemic changes. CT head without acute abnormality, she does have chronic right basal ganglia lacunar infarct. UA collected and shows possible infection with large leukocytes, 21-50 WBC. Urine culture sent.  Discussed antbiotic treatment options with ED pharmacist Suezanne Jacquet who recommends fosfomycin based on previous culture results and history of urosepsis in the past. Patient ambulated with walker and did not have any dizziness. Given her symptoms have resolved after fluids and work up is reassuring will discharge home. The patient appears reasonably screened and/or stabilized for discharge and I doubt any other medical condition or other [facility] requiring further screening, evaluation, or treatment in the ED at this time prior to discharge. The patient is safe for discharge with strict return precautions discussed. Recommend pcp follow up if symptoms for recheck. The patient was discussed with and seen by Dr. Kathrynn Humble who agrees with the treatment plan.  Portions of this note were generated with [employer]. Dictation errors may occur despite best attempts at proofreading.    Final Clinical Impression(s) / ED Diagnoses Final diagnoses:  Dizziness  Acute cystitis without hematuria    Rx / DC Orders ED Discharge Orders    None       Flint Melter 02/10/20 2120    Varney Biles, MD 02/10/20 2251

## 2020-02-10 NOTE — Discharge Instructions (Addendum)
You have been seen today for dizziness. Please read and follow all provided instructions. Return to the emergency room for worsening condition or new concerning symptoms.    Your head CT did not show signs of a stroke.  Your blood work was normal overall. Your urine sample showed infection.  You were treated with the  antibiotic in the emergency department as a one time dose. No further antibiotics needed.  1. Medications: Continue usual home medications Take medications as prescribed. Please review all of the medicines and only take them if you do not have an allergy to them.   2. Treatment: rest, drink plenty of fluids  3. Follow Up:  Please follow up with primary care provider by scheduling an appointment as soon as possible for a visit     ?

## 2020-02-12 ENCOUNTER — Telehealth: Payer: Self-pay

## 2020-02-12 ENCOUNTER — Inpatient Hospital Stay (HOSPITAL_COMMUNITY)
Admission: RE | Admit: 2020-02-12 | Discharge: 2020-02-22 | DRG: 690 | Disposition: A | Discharge status: Skilled Nursing Facility | Payer: Medicare HMO | Attending: Internal Medicine | Admitting: Internal Medicine

## 2020-02-12 ENCOUNTER — Encounter (HOSPITAL_COMMUNITY): Payer: Self-pay

## 2020-02-12 ENCOUNTER — Other Ambulatory Visit: Payer: Self-pay

## 2020-02-12 ENCOUNTER — Emergency Department (HOSPITAL_COMMUNITY): Payer: Medicare HMO

## 2020-02-12 DIAGNOSIS — E1122 Type 2 diabetes mellitus with diabetic chronic kidney disease: Secondary | ICD-10-CM | POA: Diagnosis present

## 2020-02-12 DIAGNOSIS — E11319 Type 2 diabetes mellitus with unspecified diabetic retinopathy without macular edema: Secondary | ICD-10-CM | POA: Diagnosis present

## 2020-02-12 DIAGNOSIS — F329 Major depressive disorder, single episode, unspecified: Secondary | ICD-10-CM | POA: Diagnosis present

## 2020-02-12 DIAGNOSIS — I160 Hypertensive urgency: Secondary | ICD-10-CM | POA: Diagnosis not present

## 2020-02-12 DIAGNOSIS — I6389 Other cerebral infarction: Secondary | ICD-10-CM | POA: Diagnosis not present

## 2020-02-12 DIAGNOSIS — D649 Anemia, unspecified: Secondary | ICD-10-CM | POA: Diagnosis not present

## 2020-02-12 DIAGNOSIS — R0902 Hypoxemia: Secondary | ICD-10-CM | POA: Diagnosis not present

## 2020-02-12 DIAGNOSIS — B9620 Unspecified Escherichia coli [E. coli] as the cause of diseases classified elsewhere: Secondary | ICD-10-CM | POA: Diagnosis present

## 2020-02-12 DIAGNOSIS — R69 Illness, unspecified: Secondary | ICD-10-CM | POA: Diagnosis not present

## 2020-02-12 DIAGNOSIS — G319 Degenerative disease of nervous system, unspecified: Secondary | ICD-10-CM | POA: Diagnosis not present

## 2020-02-12 DIAGNOSIS — Z7984 Long term (current) use of oral hypoglycemic drugs: Secondary | ICD-10-CM

## 2020-02-12 DIAGNOSIS — I1310 Hypertensive heart and chronic kidney disease without heart failure, with stage 1 through stage 4 chronic kidney disease, or unspecified chronic kidney disease: Secondary | ICD-10-CM | POA: Diagnosis present

## 2020-02-12 DIAGNOSIS — R42 Dizziness and giddiness: Secondary | ICD-10-CM

## 2020-02-12 DIAGNOSIS — Z20822 Contact with and (suspected) exposure to covid-19: Secondary | ICD-10-CM | POA: Diagnosis present

## 2020-02-12 DIAGNOSIS — F32A Depression, unspecified: Secondary | ICD-10-CM | POA: Diagnosis present

## 2020-02-12 DIAGNOSIS — E860 Dehydration: Secondary | ICD-10-CM | POA: Diagnosis present

## 2020-02-12 DIAGNOSIS — R29810 Facial weakness: Secondary | ICD-10-CM | POA: Diagnosis present

## 2020-02-12 DIAGNOSIS — Z66 Do not resuscitate: Secondary | ICD-10-CM | POA: Diagnosis present

## 2020-02-12 DIAGNOSIS — N1832 Chronic kidney disease, stage 3b: Secondary | ICD-10-CM | POA: Diagnosis present

## 2020-02-12 DIAGNOSIS — Z79899 Other long term (current) drug therapy: Secondary | ICD-10-CM

## 2020-02-12 DIAGNOSIS — F419 Anxiety disorder, unspecified: Secondary | ICD-10-CM

## 2020-02-12 DIAGNOSIS — I69352 Hemiplegia and hemiparesis following cerebral infarction affecting left dominant side: Secondary | ICD-10-CM

## 2020-02-12 DIAGNOSIS — R0789 Other chest pain: Secondary | ICD-10-CM | POA: Diagnosis not present

## 2020-02-12 DIAGNOSIS — Z7982 Long term (current) use of aspirin: Secondary | ICD-10-CM

## 2020-02-12 DIAGNOSIS — I69959 Hemiplegia and hemiparesis following unspecified cerebrovascular disease affecting unspecified side: Secondary | ICD-10-CM

## 2020-02-12 DIAGNOSIS — I69354 Hemiplegia and hemiparesis following cerebral infarction affecting left non-dominant side: Secondary | ICD-10-CM

## 2020-02-12 DIAGNOSIS — H7092 Unspecified mastoiditis, left ear: Secondary | ICD-10-CM | POA: Diagnosis not present

## 2020-02-12 DIAGNOSIS — E785 Hyperlipidemia, unspecified: Secondary | ICD-10-CM | POA: Diagnosis present

## 2020-02-12 DIAGNOSIS — I10 Essential (primary) hypertension: Secondary | ICD-10-CM | POA: Diagnosis not present

## 2020-02-12 DIAGNOSIS — N3000 Acute cystitis without hematuria: Principal | ICD-10-CM | POA: Diagnosis present

## 2020-02-12 DIAGNOSIS — G4489 Other headache syndrome: Secondary | ICD-10-CM | POA: Diagnosis not present

## 2020-02-12 LAB — URINALYSIS, ROUTINE W REFLEX MICROSCOPIC
Bilirubin Urine: NEGATIVE
Glucose, UA: NEGATIVE mg/dL
Hgb urine dipstick: NEGATIVE
Ketones, ur: NEGATIVE mg/dL
Nitrite: NEGATIVE
Protein, ur: NEGATIVE mg/dL
Specific Gravity, Urine: 1.014 (ref 1.005–1.030)
pH: 6 (ref 5.0–8.0)

## 2020-02-12 LAB — BASIC METABOLIC PANEL
Anion gap: 10 (ref 5–15)
BUN: 15 mg/dL (ref 8–23)
CO2: 26 mmol/L (ref 22–32)
Calcium: 8.6 mg/dL — ABNORMAL LOW (ref 8.9–10.3)
Chloride: 106 mmol/L (ref 98–111)
Creatinine, Ser: 1.29 mg/dL — ABNORMAL HIGH (ref 0.44–1.00)
GFR calc Af Amer: 43 mL/min — ABNORMAL LOW (ref >60)
GFR calc non Af Amer: 37 mL/min — ABNORMAL LOW (ref >60)
Glucose, Bld: 123 mg/dL — ABNORMAL HIGH (ref 70–99)
Potassium: 4.3 mmol/L (ref 3.5–5.1)
Sodium: 142 mmol/L (ref 135–145)

## 2020-02-12 LAB — CBC
HCT: 36.3 % (ref 36.0–46.0)
Hemoglobin: 11.1 g/dL — ABNORMAL LOW (ref 12.0–15.0)
MCH: 28.7 pg (ref 26.0–34.0)
MCHC: 30.6 g/dL (ref 30.0–36.0)
MCV: 93.8 fL (ref 80.0–100.0)
Platelets: 152 10*3/uL (ref 150–400)
RBC: 3.87 MIL/uL (ref 3.87–5.11)
RDW: 13.4 % (ref 11.5–15.5)
WBC: 4.5 10*3/uL (ref 4.0–10.5)
nRBC: 0 % (ref 0.0–0.2)

## 2020-02-12 LAB — GLUCOSE, CAPILLARY
Glucose-Capillary: 100 mg/dL — ABNORMAL HIGH (ref 70–99)
Glucose-Capillary: 176 mg/dL — ABNORMAL HIGH (ref 70–99)

## 2020-02-12 LAB — SARS CORONAVIRUS 2 BY RT PCR (HOSPITAL ORDER, PERFORMED IN ~~LOC~~ HOSPITAL LAB): SARS Coronavirus 2: NEGATIVE

## 2020-02-12 MED ORDER — ACETAMINOPHEN 325 MG PO TABS
650.0000 mg | ORAL_TABLET | Freq: Once | ORAL | Status: AC
  Administered 2020-02-12: 650 mg via ORAL
  Filled 2020-02-12: qty 2

## 2020-02-12 MED ORDER — HYDRALAZINE HCL 50 MG PO TABS
100.0000 mg | ORAL_TABLET | Freq: Three times a day (TID) | ORAL | Status: DC
  Administered 2020-02-12 – 2020-02-22 (×30): 100 mg via ORAL
  Filled 2020-02-12 (×30): qty 2

## 2020-02-12 MED ORDER — ACETAMINOPHEN 650 MG RE SUPP: 650.0000 mg | Freq: Four times a day (QID) | RECTAL | Status: DC | PRN

## 2020-02-12 MED ORDER — SODIUM CHLORIDE 0.9 % IV BOLUS
500.0000 mL | Freq: Once | INTRAVENOUS | Status: AC
  Administered 2020-02-12: 500 mL via INTRAVENOUS

## 2020-02-12 MED ORDER — MECLIZINE HCL 25 MG PO TABS
25.0000 mg | ORAL_TABLET | Freq: Once | ORAL | Status: AC
  Administered 2020-02-12: 25 mg via ORAL
  Filled 2020-02-12: qty 1

## 2020-02-12 MED ORDER — ALPRAZOLAM 0.25 MG PO TABS
0.5000 mg | ORAL_TABLET | Freq: Two times a day (BID) | ORAL | Status: DC
  Administered 2020-02-12 – 2020-02-22 (×20): 0.5 mg via ORAL
  Filled 2020-02-12 (×21): qty 2

## 2020-02-12 MED ORDER — HYDROCORTISONE 1 % EX CREA
TOPICAL_CREAM | Freq: Two times a day (BID) | CUTANEOUS | Status: DC
  Filled 2020-02-12: qty 28

## 2020-02-12 MED ORDER — GEMFIBROZIL 600 MG PO TABS
600.0000 mg | ORAL_TABLET | Freq: Every day | ORAL | Status: DC
  Administered 2020-02-13 – 2020-02-22 (×10): 600 mg via ORAL
  Filled 2020-02-12 (×10): qty 1

## 2020-02-12 MED ORDER — SERTRALINE HCL 50 MG PO TABS
50.0000 mg | ORAL_TABLET | Freq: Every day | ORAL | Status: DC
  Administered 2020-02-13 – 2020-02-22 (×10): 50 mg via ORAL
  Filled 2020-02-12 (×10): qty 1

## 2020-02-12 MED ORDER — ENOXAPARIN SODIUM 40 MG/0.4ML ~~LOC~~ SOLN
40.0000 mg | SUBCUTANEOUS | Status: DC
  Administered 2020-02-12 – 2020-02-21 (×10): 40 mg via SUBCUTANEOUS
  Filled 2020-02-12 (×10): qty 0.4

## 2020-02-12 MED ORDER — ONDANSETRON HCL 4 MG PO TABS: 4.0000 mg | ORAL_TABLET | Freq: Four times a day (QID) | ORAL | Status: DC | PRN

## 2020-02-12 MED ORDER — TRANDOLAPRIL 1 MG PO TABS
4.0000 mg | ORAL_TABLET | Freq: Every day | ORAL | Status: DC
  Administered 2020-02-13 – 2020-02-22 (×10): 4 mg via ORAL
  Filled 2020-02-12 (×10): qty 4
  Filled 2020-02-12: qty 1

## 2020-02-12 MED ORDER — HYDRALAZINE HCL 20 MG/ML IJ SOLN
10.0000 mg | INTRAMUSCULAR | Status: DC | PRN
  Administered 2020-02-13 – 2020-02-18 (×2): 10 mg via INTRAVENOUS
  Filled 2020-02-12 (×2): qty 1

## 2020-02-12 MED ORDER — MIRTAZAPINE 7.5 MG PO TABS
7.5000 mg | ORAL_TABLET | Freq: Every day | ORAL | Status: DC
  Administered 2020-02-12 – 2020-02-21 (×10): 7.5 mg via ORAL
  Filled 2020-02-12 (×11): qty 1

## 2020-02-12 MED ORDER — ACETAMINOPHEN 325 MG PO TABS: 650.0000 mg | ORAL_TABLET | Freq: Four times a day (QID) | ORAL | Status: DC | PRN

## 2020-02-12 MED ORDER — VITAMIN B-12 1000 MCG PO TABS
1000.0000 ug | ORAL_TABLET | Freq: Every day | ORAL | Status: DC
  Administered 2020-02-13 – 2020-02-22 (×10): 1000 ug via ORAL
  Filled 2020-02-12 (×10): qty 1

## 2020-02-12 MED ORDER — SODIUM CHLORIDE 0.9% FLUSH
3.0000 mL | Freq: Once | INTRAVENOUS | Status: AC
  Administered 2020-02-12: 3 mL via INTRAVENOUS

## 2020-02-12 MED ORDER — SODIUM CHLORIDE 0.9% FLUSH
3.0000 mL | Freq: Two times a day (BID) | INTRAVENOUS | Status: DC
  Administered 2020-02-12 – 2020-02-21 (×14): 3 mL via INTRAVENOUS

## 2020-02-12 MED ORDER — FERROUS SULFATE 325 (65 FE) MG PO TABS
325.0000 mg | ORAL_TABLET | Freq: Two times a day (BID) | ORAL | Status: DC
  Administered 2020-02-12 – 2020-02-22 (×20): 325 mg via ORAL
  Filled 2020-02-12 (×20): qty 1

## 2020-02-12 MED ORDER — INSULIN ASPART 100 UNIT/ML ~~LOC~~ SOLN
0.0000 [IU] | Freq: Three times a day (TID) | SUBCUTANEOUS | Status: DC
  Administered 2020-02-13 – 2020-02-14 (×2): 2 [IU] via SUBCUTANEOUS
  Administered 2020-02-15 (×2): 1 [IU] via SUBCUTANEOUS
  Administered 2020-02-15 – 2020-02-16 (×3): 2 [IU] via SUBCUTANEOUS
  Administered 2020-02-16: 1 [IU] via SUBCUTANEOUS
  Administered 2020-02-17 (×2): 2 [IU] via SUBCUTANEOUS
  Administered 2020-02-18: 1 [IU] via SUBCUTANEOUS
  Administered 2020-02-18: 3 [IU] via SUBCUTANEOUS
  Administered 2020-02-18: 2 [IU] via SUBCUTANEOUS
  Administered 2020-02-19: 3 [IU] via SUBCUTANEOUS
  Administered 2020-02-19: 1 [IU] via SUBCUTANEOUS
  Administered 2020-02-19 – 2020-02-20 (×3): 2 [IU] via SUBCUTANEOUS
  Administered 2020-02-20 – 2020-02-21 (×2): 3 [IU] via SUBCUTANEOUS
  Administered 2020-02-21: 2 [IU] via SUBCUTANEOUS
  Administered 2020-02-21: 5 [IU] via SUBCUTANEOUS
  Administered 2020-02-22 (×2): 3 [IU] via SUBCUTANEOUS

## 2020-02-12 MED ORDER — AMLODIPINE BESYLATE 10 MG PO TABS
10.0000 mg | ORAL_TABLET | Freq: Every day | ORAL | Status: DC
  Administered 2020-02-13 – 2020-02-22 (×10): 10 mg via ORAL
  Filled 2020-02-12 (×10): qty 1

## 2020-02-12 MED ORDER — DOCUSATE SODIUM 100 MG PO CAPS
200.0000 mg | ORAL_CAPSULE | Freq: Every day | ORAL | Status: DC
  Administered 2020-02-12 – 2020-02-21 (×10): 200 mg via ORAL
  Filled 2020-02-12 (×10): qty 2

## 2020-02-12 MED ORDER — ONDANSETRON HCL 4 MG/2ML IJ SOLN: 4.0000 mg | Freq: Four times a day (QID) | INTRAMUSCULAR | Status: DC | PRN

## 2020-02-12 MED ORDER — OLOPATADINE HCL 0.1 % OP SOLN
1.0000 [drp] | Freq: Every day | OPHTHALMIC | Status: DC
  Administered 2020-02-12 – 2020-02-22 (×11): 1 [drp] via OPHTHALMIC
  Filled 2020-02-12: qty 5

## 2020-02-12 MED ORDER — ASPIRIN 325 MG PO TABS
325.0000 mg | ORAL_TABLET | Freq: Every day | ORAL | Status: DC
  Administered 2020-02-13 – 2020-02-22 (×10): 325 mg via ORAL
  Filled 2020-02-12 (×10): qty 1

## 2020-02-12 NOTE — Telephone Encounter (Signed)
[place] Day - Client TELEPHONE ADVICE RECORD AccessNurse Patient Name: Renee [name] Gender: Female DOB: 08-15-1929 Age: 86 Y 10 M 29 D Return Phone Number: [phone] (Primary), [phone] (Secondary), [phone] (Alternate) [place] [address] Client [facility] - Client Client Site [place] Physician Glori Bickers, Roque Lias - MD Contact Type Call Who Is Calling Patient / Member / Family / Caregiver Call Type Triage / Clinical Caller Name [facility] Relationship To Patient Care [place] Return Phone Number [phone] (Alternate) Chief Complaint Dizziness Reason for Call Symptomatic / Request for Health Information Initial Comment Caller states PT: caregiver got PT out of the bed, fell right back, because she was so dizzy, recently in hospital for yeast infection, on antibiotics. Translation No Nurse Assessment Nurse: Rock Nephew, RN, Juliann Pulse Date/Time (Eastern Time): 02/12/2020 10:06:51 AM Confirm and document reason for call. If symptomatic, describe symptoms. ---Caller stated she is patient's caregiver [facility] ) . She has had several episodes of dizziness throughout the week and was seen in the ER on Tuesday and diagnosed with a UTI and is on abx. She continues to complain of severe dizziness and today her BP is 211/103 with a HR= 66. Has the patient had close contact with a person known or suspected to have the novel coronavirus illness OR traveled / lives in area with major community spread (including international travel) in the last 14 days from the onset of symptoms? * If Asymptomatic, screen for exposure and travel within the last 14 days. ---No Does the patient have any new or worsening symptoms? ---Yes Will a triage be completed? ---Yes Related visit to physician within the last 2 weeks? ---Yes Does the PT have any chronic conditions? (i.e. diabetes, asthma, this  includes High risk factors for pregnancy, etc.) ---Yes List chronic conditions. ---HTN, asthma, diabetes Is this a behavioral health or substance abuse call? ---No Guidelines Guideline Title Affirmed Question Affirmed Notes Nurse Date/Time (Eastern Time) Blood Pressure - High Difficult to awaken or acting confused (e.g., Rock Nephew, RN, Juliann Pulse 02/12/2020 10:09:17 AM PLEASE NOTE: All timestamps contained within this report are represented as [place] Standard Time. CONFIDENTIALTY NOTICE: This fax transmission is intended only for the addressee. It contains information that is legally privileged, confidential or otherwise protected from use or disclosure. If you are not the intended recipient, you are strictly prohibited from reviewing, disclosing, copying using or disseminating any of this information or taking any action in reliance on or regarding this information. If you have received this fax in error, please notify us immediately by telephone so that we can arrange for its return to [place]. Phone: [phone], Toll-Free: [phone], Fax: [phone] Page: 2 of 2 Call Id: [id number] Guidelines Guideline Title Affirmed Question Affirmed Notes Nurse Date/Time Eilene Ghazi Time) disoriented, slurred speech) Disp. Time Eilene Ghazi Time) Disposition Final User 02/12/2020 10:19:02 AM 911 Outcome Documentation Rock Nephew, RN, Juliann Pulse Reason: EMS on the way 02/12/2020 10:11:27 AM Call EMS 911 Now Yes Rock Nephew, RN, Gara Kroner Disagree/Comply Comply Caller Understands Yes PreDisposition Call Doctor Care Advice Given Per Guideline CALL EMS 911 NOW:  Immediate medical attention is needed. You need to hang up and call 911 (or an ambulance). Triager Discretion: I'll call you back in a few minutes to be sure you were able to reach them. CARE ADVICE given per High Blood Pressure (Adult) guideline.

## 2020-02-12 NOTE — Plan of Care (Signed)
  Problem: Safety: Goal: Ability to remain free from injury will improve Outcome: Progressing   

## 2020-02-12 NOTE — Plan of Care (Signed)
  Problem: Education: Goal: Knowledge of General Education information will improve Description Including pain rating scale, medication(s)/side effects and non-pharmacologic comfort measures Outcome: Progressing   

## 2020-02-12 NOTE — Telephone Encounter (Signed)
Per chart review tab pt is at [facility] ED now.

## 2020-02-12 NOTE — ED Notes (Signed)
Patient transported to MRI 

## 2020-02-12 NOTE — H&P (Signed)
History and Physical    Renee [name] EZM:[id number] DOB: [birth year] DOA: 02/12/2020  Referring MD/NP/PA: Janetta Hora, PA-C PCP: Abner Greenspan, MD  Patient coming from: home   Chief Complaint: Dizziness  I have personally briefly reviewed patient's old medical records in [place]   HPI: Renee [name] is a 86 y.o. female with medical history significant of CVA with residual left-sided weakness, diabetes mellitus type 2, vertigo, hypertension, hyperlipidemia, CKD stage 3, and recurrent UTIs.  At baseline she ambulates with the use of a walker.  She presents with complaints of dizziness over the last 3 days.  She reports feeling as though the room is spinning around her and as if she may pass out.  Symptoms only present when changing positions and resolved when laying still.  Notes associated symptoms of nausea when symptoms are present.  Denies having any vomiting, fever, loss of consciousness, change in appetite, or diarrhea.  Patient had been seen in the emergency department 2 days ago for the same symptoms.  CT scan revealed chronic right basal ganglia infarct without acute abnormalities.  She was diagnosed with a urinary tract infection and given one-time dose of fosfomycin.  She denies any complaints urinary frequency or dysuria symptoms since receiving treatment.  ED Course: Upon admission into the emergency department was pulse 52-59, blood pressures elevated up to 184/76, and all other vital signs within normal limits.  Labs significant for low 11.1, BUN 15, and creatinine 1.29.  MRI of the brain was obtained and official read pending.  Patient had been given 500 mL of normal saline IV fluids, 25 mg of meclizine, and Tylenol.  TRH called to admit as symptoms were not improved.  Review of Systems  Constitutional: Positive for malaise/fatigue. Negative for fever.  HENT: Negative for congestion and nosebleeds.   Eyes: Negative for photophobia and pain.  Respiratory: Negative for  cough and shortness of breath.   Cardiovascular: Negative for chest pain and palpitations.  Gastrointestinal: Positive for nausea. Negative for diarrhea and vomiting.  Genitourinary: Negative for dysuria and hematuria.  Musculoskeletal: Negative for falls.  Skin: Positive for itching.  Neurological: Positive for dizziness. Negative for loss of consciousness.  Psychiatric/Behavioral: Negative for memory loss and substance abuse.    Past Medical History:  Diagnosis Date  . Angioedema    possibly from voltaren  . Bronchopneumonia 12/11/2016  . Degenerative disc disease   . Diabetes mellitus    type II  . Hyperlipidemia   . Hypertension   . LVH (left ventricular hypertrophy)    and atrial enlargement by echo in past with nl EF  . Nasal pruritis   . Osteoarthritis   . Osteopenia   . Renal insufficiency   . Sleep apnea   . Stroke [facility]) 05/2010   Small vessel sobcortical (in [place] trial) with Dr Leonie Man, residual L hemiparesis  . Vitamin B 12 deficiency 04/08    Past Surgical History:  Procedure Laterality Date  . ABDOMINAL HYSTERECTOMY     BSO-fibroids  . APPENDECTOMY    . BACK SURGERY    . COLON SURGERY     due to punctured intestines  . EYE SURGERY     cataract extraction  . KNEE SURGERY     arthroscope  . PARS PLANA VITRECTOMY  07/31/2011   Procedure: PARS PLANA VITRECTOMY WITH 25 GAUGE;  Surgeon: [name], MD;  Location: [facility];  Service: Ophthalmology;  Laterality: Right;  REMOVAL OF SILICONE OIL AND LASER RIGHT EYE  .  RETINAL DETACHMENT SURGERY  02/18/11   times 2  . SPINE SURGERY  08/09   spinal decompression surgery     reports that she has never smoked. She has never used smokeless tobacco. She reports that she does not drink alcohol and does not use drugs.  Allergies  Allergen Reactions  . Diclofenac Sodium Other (See Comments)    REACTION: angioedema  . Pioglitazone Swelling    Family History  Problem Relation Age of Onset  . COPD Brother   .  Cancer Sister        brain tumor with hemmorhage  . Heart disease Sister        CAD    Prior to Admission medications   Medication Sig Start Date End Date Taking? Authorizing Provider  acetaminophen (TYLENOL) 325 MG tablet Take 650 mg by mouth every 6 (six) hours as needed.   Yes [provider]  albuterol (VENTOLIN HFA) 108 (90 Base) MCG/ACT inhaler Inhale 2 puffs into the lungs every 4 (four) hours as needed for wheezing or shortness of breath. 07/13/19  Yes Tower, Wynelle Fanny, MD  ALPRAZolam (XANAX) 0.5 MG tablet TAKE 1/2 TO 1 TABLET BY MOUTH TWICE A DAY AS NEEDED FOR ANXIETY Patient taking differently: Take 0.5 mg by mouth in the morning and at bedtime.  01/19/20  Yes Tower, Wynelle Fanny, MD  amLODipine (NORVASC) 10 MG tablet Take 1 tablet (10 mg total) by mouth daily. 09/30/18  Yes Tower, Wynelle Fanny, MD  aspirin 325 MG tablet Take 325 mg by mouth daily.    Yes [provider]  Blood Glucose Monitoring Suppl (ONE TOUCH ULTRA 2) w/Device KIT Check blood sugar once daily and as directed. Dx E11.9 08/27/18  Yes Tower, Wynelle Fanny, MD  calcium-vitamin D (OSCAL WITH D) 500-200 MG-UNIT per tablet Take 1 tablet by mouth daily.    Yes [provider]  Cholecalciferol (VITAMIN D) 1000 UNITS capsule Take 1,000 Units by mouth daily.    Yes [provider]  docusate sodium (COLACE) 100 MG capsule Take 200 mg by mouth at bedtime.   Yes [provider]  ferrous sulfate 325 (65 FE) MG tablet Take 325 mg by mouth 2 (two) times daily.   Yes [provider]  gemfibrozil (LOPID) 600 MG tablet TAKE 1 TABLET BY MOUTH EVERY DAY Patient taking differently: Take 600 mg by mouth daily.  09/08/19  Yes Tower, Marne A, MD  glipiZIDE (GLUCOTROL XL) 2.5 MG 24 hr tablet TAKE 1 TABLET BY MOUTH EVERY DAY WITH BREAKFAST Patient taking differently: Take 2.5 mg by mouth daily with breakfast.  07/20/19  Yes Tower, Wynelle Fanny, MD  hydrALAZINE (APRESOLINE) 100 MG tablet Take 100 mg by mouth 3  (three) times daily.  08/30/14  Yes [provider]  metoprolol succinate (TOPROL-XL) 25 MG 24 hr tablet Take 0.5 tablets (12.5 mg total) by mouth 2 (two) times daily. 09/30/18  Yes Tower, Wynelle Fanny, MD  mirtazapine (REMERON) 15 MG tablet TAKE 1/2 TABLET BY MOUTH AT BEDTIME Patient taking differently: Take 7.5 mg by mouth at bedtime.  06/16/19  Yes Tower, Wynelle Fanny, MD  Multiple Vitamin (MULTIVITAMIN) capsule Take 1 capsule by mouth daily.    Yes [provider]  Olopatadine HCl (PATADAY OP) Place 1 drop into both eyes daily.   Yes [provider]  OneTouch Delica Lancets 41Y MISC CHECK BLOOD SUGAR ONCE DAILY AND AS DIRECTED. DX E11.9 Patient taking differently: 1 Device by Other route daily.  10/23/19  Yes  Tower, Wynelle Fanny, MD  ONETOUCH ULTRA test strip CHECK BLOOD SUGAR ONCE DAILY AND AS DIRECTED. DX E11.9 12/22/19  Yes Tower, Wynelle Fanny, MD  sertraline (ZOLOFT) 50 MG tablet TAKE 1 TABLET BY MOUTH EVERY DAY Patient taking differently: Take 50 mg by mouth daily.  09/08/19  Yes Tower, Wynelle Fanny, MD  trandolapril (MAVIK) 4 MG tablet Take 1 tablet (4 mg total) by mouth daily. 09/30/18  Yes Tower, Wynelle Fanny, MD  vitamin B-12 (CYANOCOBALAMIN) 1000 MCG tablet Take 1,000 mcg by mouth daily.    Yes [provider]  Polyvinyl Alcohol-Povidone (REFRESH OP) Place 1 drop into both eyes daily as needed (dry eyes). Patient not taking: Reported on 02/12/2020    [provider]  valACYclovir (VALTREX) 500 MG tablet Take 1 tablet (500 mg total) by mouth daily. Patient not taking: Reported on 02/12/2020 12/21/19   Abner Greenspan, MD    Physical Exam:  Constitutional: Elderly female currently in NAD, calm, comfortable Vitals:   02/12/20 1149 02/12/20 1245 02/12/20 1345  BP: (!) 146/79 (!) 175/72 (!) 184/76  Pulse: (!) 59 (!) 52 (!) 57  Resp: (!) 21  18  Temp: 98.3 F (36.8 C)    TempSrc: Oral    SpO2: 98% 96% 98%   Eyes: PERRL, lids and conjunctivae normal.  Lateral nystagmus  appreciated. ENMT: Mucous membranes are moist. Posterior pharynx clear of any exudate or lesions.  Neck: normal, supple, no masses, no thyromegaly Respiratory: clear to auscultation bilaterally, no wheezing, no crackles. Normal respiratory effort. No accessory muscle use.  Cardiovascular: Regular rate and rhythm, no murmurs / rubs / gallops. No extremity edema. 2+ pedal pulses. No carotid bruits.  Abdomen: no tenderness, no masses palpated. No hepatosplenomegaly. Bowel sounds positive.  Musculoskeletal: no clubbing / cyanosis. No joint deformity upper and lower extremities. Good ROM, no contractures. Normal muscle tone.  Skin: no rashes, lesions, ulcers. No induration Neurologic: CN 2-12 grossly intact.  Mild left-sided weakness appreciated. Psychiatric: Normal judgment and insight. Alert and oriented x 3. Normal mood.     Labs on Admission: I have personally reviewed following labs and imaging studies  CBC: Recent Labs  Lab 02/10/20 1123 02/12/20 1331  WBC 6.3 4.5  HGB 12.7 11.1*  HCT 41.1 36.3  MCV 94.1 93.8  PLT 187 169   Basic Metabolic Panel: Recent Labs  Lab 02/10/20 1123 02/12/20 1331  NA 141 142  K 4.5 4.3  CL 105 106  CO2 25 26  GLUCOSE 135* 123*  BUN 18 15  CREATININE 1.42* 1.29*  CALCIUM 9.0 8.6*   GFR: Estimated Creatinine Clearance: 29.8 mL/min (A) (by C-G formula based on SCr of 1.29 mg/dL (H)). Liver Function Tests: No results for input(s): AST, ALT, ALKPHOS, BILITOT, PROT, ALBUMIN in the last 168 hours. No results for input(s): LIPASE, AMYLASE in the last 168 hours. No results for input(s): AMMONIA in the last 168 hours. Coagulation Profile: No results for input(s): INR, PROTIME in the last 168 hours. Cardiac Enzymes: No results for input(s): CKTOTAL, CKMB, CKMBINDEX, TROPONINI in the last 168 hours. BNP (last 3 results) No results for input(s): PROBNP in the last 8760 hours. HbA1C: No results for input(s): HGBA1C in the last 72  hours. CBG: Recent Labs  Lab 02/10/20 1920  GLUCAP 104*   Lipid Profile: No results for input(s): CHOL, HDL, LDLCALC, TRIG, CHOLHDL, LDLDIRECT in the last 72 hours. Thyroid Function Tests: No results for input(s): TSH, T4TOTAL, FREET4, T3FREE, THYROIDAB in the last 72 hours. Anemia  Panel: No results for input(s): VITAMINB12, FOLATE, FERRITIN, TIBC, IRON, RETICCTPCT in the last 72 hours. Urine analysis:    Component Value Date/Time   COLORURINE YELLOW 02/12/2020 [place] 02/12/2020 1423   LABSPEC 1.014 02/12/2020 1423   PHURINE 6.0 02/12/2020 1423   GLUCOSEU NEGATIVE 02/12/2020 1423   HGBUR NEGATIVE 02/12/2020 1423   HGBUR negative 03/27/2010 [place] 02/12/2020 1423   BILIRUBINUR neg. 06/18/2014 [place] 02/12/2020 1423   PROTEINUR NEGATIVE 02/12/2020 1423   UROBILINOGEN 0.2 06/18/2014 1155   UROBILINOGEN 0.2 09/04/2012 1135   NITRITE NEGATIVE 02/12/2020 1423   LEUKOCYTESUR SMALL (A) 02/12/2020 1423   Sepsis Labs: Recent Results (from the past 240 hour(s))  Urine culture     Status: Abnormal (Preliminary result)   Collection Time: 02/10/20  8:50 PM   Specimen: Urine  Result Value Ref Range Status   Specimen Description Urine  Final   Special Requests   Final    NONE Performed at [facility], [place] [address] [place], [place] [address]    Culture >=100,000 COLONIES/mL ESCHERICHIA COLI (A)  Final   Report Status PENDING  Incomplete     Radiological Exams on Admission: CT Head Wo Contrast  Result Date: 02/10/2020 CLINICAL DATA:  Dizziness. EXAM: CT HEAD WITHOUT CONTRAST TECHNIQUE: Contiguous axial images were obtained from the base of the skull through the vertex without intravenous contrast. COMPARISON:  October 31, 2010 FINDINGS: Brain: There is mild to moderate severity cerebral atrophy with widening of the extra-axial spaces and ventricular dilatation. There are areas of decreased attenuation within the white  matter tracts of the supratentorial brain, consistent with microvascular disease changes. A chronic right basal ganglia lacunar infarct is seen. This is present on the prior study. Vascular: No hyperdense vessel or unexpected calcification. Skull: Normal. Negative for fracture or focal lesion. Sinuses/Orbits: No acute finding. Evidence of prior scleral banding is noted on the right. Other: None. IMPRESSION: 1. Generalized cerebral atrophy. 2. Chronic right basal ganglia lacunar infarct. 3. No acute intracranial abnormality. Electronically Signed   By: Virgina Norfolk M.D.   On: 02/10/2020 20:27    EKG: Independently reviewed.  Sinus bradycardia 58 bpm  Assessment/Plan Dizziness: Patient presents with complaints of dizziness.  Reports feeling as though the room is obtaining around her and asked if she may pass out.  CT scan of the brain negative for any acute abnormalities from 6/23 and MRI also negative for any acute abnormalities.  Suspect patient symptoms to be related to vertigo. -Admit to a medical telemetry bed -Check orthostatic vital signs -PT consult -Follow-up telemetry overnight  Bradycardia: On admission initial heart rates in the 50s.  Unclear if bradycardia is contributing to patient's symptoms. -Hold metoprolol -Monitor telemetry overnight  Hypertensive urgency: Acute.  Systolic blood pressures elevated up to 190.  Home blood pressure medications include amlodipine 10 mg daily, hydralazine 100 mg 3 times daily, and metoprolol 12.5 mg twice daily. -Continue amlodipine and hydralazine -Hydralazine IV as needed  Recent UTI Abnormal urinalysis: Patient was diagnosed with a urinary tract infection 2 days ago treated with 1 dose fosfomycin.  Repeat urinalysis was only noted small leukocytes with rare bacteria, and 6-10 WBCs.  Chronic kidney disease stage IIIb: Creatinine 1.29 on admission which appears near patient's baseline. -Continue to monitor  History of CVA with residual  deficits: Patient with residual left-sided weakness unchanged.  Diabetes mellitus type 2: Last hemoglobin A1c noted to be 6.1 on 01/05/2020.  Home medications include glipizide 2.5 mg daily with breakfast. -Hypoglycemic protocol -Hold glipizide -CBGs before every meal with sensitive SSI  Normocytic anemia: Hemoglobin 11.1 on admission which appears around patient's baseline. -Continue to monitor  Dyslipidemia: Home medications include gemfibrozil 600 mg daily -Continue gemfibrozil  Anxiety and depression -Continue Xanax and Zoloft  DNR present on admission  DVT prophylaxis: lovneox Code Status: DNR Family Communication: Sister updated at bedside Disposition Plan: Likely discharge home with symptoms improved with IV fluids Consults called: None Admission status: Observation  Norval Morton MD [facility] Pager [phone]   If 7PM-7AM, please contact night-coverage [email or web address] Password [facility]  02/12/2020, 3:19 PM

## 2020-02-12 NOTE — Telephone Encounter (Signed)
Noted  

## 2020-02-12 NOTE — ED Provider Notes (Signed)
[facility] EMERGENCY DEPARTMENT Provider Note   CSN: [id number] Arrival date & time: 02/12/20  1145   History Chief Complaint  Patient presents with  . Dizziness    Renee [name] is a 86 y.o. female with history of CVA with residual left sided deficits, vertigo, HTN, HLD, recurrent UTIs who presents with dizziness. The patient states that a couple days ago she started to get dizzy. She reports associated headache "all over" that comes and goes. She's had increasing difficulty walking although was walking fine a couple days ago. She was seen in the ED 2 days ago and symptoms were thought to be due to a UTI and orthostasis. She was treated for this with one dose of Fosfomycin. This morning she sat up to get out of bed and got very dizzy. She describes it as a spinning sensation but also states that it feels like she is going to pass out. She has a home health aide that helps from Monday to Saturday and they couldn't get her up so EMS was called. Her sister in law states that she was able to walk in the ED a couple days ago. The patient has residual left sided weakness which is unchanged. She denies any new weakness, LOC, acute vision change, numbness, tingling. She denies chest pain or SOB. She has been eating and drinking well although didn't eat this morning. She has been taking her meds as prescribed.  HPI     Past Medical History:  Diagnosis Date  . Angioedema    possibly from voltaren  . Bronchopneumonia 12/11/2016  . Degenerative disc disease   . Diabetes mellitus    type II  . Hyperlipidemia   . Hypertension   . LVH (left ventricular hypertrophy)    and atrial enlargement by echo in past with nl EF  . Nasal pruritis   . Osteoarthritis   . Osteopenia   . Renal insufficiency   . Sleep apnea   . Stroke [facility]) 05/2010   Small vessel sobcortical (in [place] trial) with Dr Leonie Man, residual L hemiparesis  . Vitamin B 12 deficiency 04/08    Patient Active Problem  List   Diagnosis Date Noted  . Fall involving sidewalk curb 11/29/2019  . Contusion of back 11/27/2019  . Hyperlipidemia associated with type 2 diabetes mellitus ([place])   . Aortic atherosclerosis ([place]) 07/13/2019  . H/O sepsis 07/01/2019  . CKD (chronic kidney disease) stage 4, GFR 15-29 ml/min (HCC) 07/01/2019  . Lower abdominal pain 05/11/2019  . Routine general medical examination at a health care facility 03/30/2019  . External hemorrhoid 01/17/2018  . History of colitis 01/17/2018  . Generalized weakness 12/24/2016  . Diabetic retinopathy ([place]) 12/11/2016  . History of CVA (cerebrovascular accident) 12/11/2016  . Estrogen deficiency 08/30/2015  . Encounter for Medicare annual wellness exam 04/17/2013  . Chest wall pain 04/07/2013  . Mobility impaired 06/26/2011  . History of retinal detachment 01/10/2011  . Sleep apnea 11/28/2010  . Depression with anxiety 08/25/2010  . Hemiplegia, late effect of cerebrovascular disease ([place]) 07/05/2010  . POSTHERPETIC NEURALGIA 11/09/2009  . Renal insufficiency 06/29/2008  . Chronic back pain 01/26/2008  . EDEMA 01/26/2008  . B12 deficiency 01/10/2007  . Type 2 diabetes, controlled, with retinopathy ([place]) 11/27/2006  . Essential hypertension 11/27/2006  . FIBROCYSTIC BREAST DISEASE 11/27/2006  . ROSACEA 11/27/2006  . OSTEOARTHRITIS 11/27/2006  . URINARY INCONTINENCE, MIXED 11/27/2006    Past Surgical History:  Procedure Laterality Date  . ABDOMINAL HYSTERECTOMY  BSO-fibroids  . APPENDECTOMY    . BACK SURGERY    . COLON SURGERY     due to punctured intestines  . EYE SURGERY     cataract extraction  . KNEE SURGERY     arthroscope  . PARS PLANA VITRECTOMY  07/31/2011   Procedure: PARS PLANA VITRECTOMY WITH 25 GAUGE;  Surgeon: [name], MD;  Location: [place];  Service: Ophthalmology;  Laterality: Right;  REMOVAL OF SILICONE OIL AND LASER RIGHT EYE  . RETINAL DETACHMENT SURGERY  02/18/11   times 2  . SPINE SURGERY  08/09    spinal decompression surgery     OB History   No obstetric history on file.     Family History  Problem Relation Age of Onset  . COPD Brother   . Cancer Sister        brain tumor with hemmorhage  . Heart disease Sister        CAD    Social History   Tobacco Use  . Smoking status: Never Smoker  . Smokeless tobacco: Never Used  Substance Use Topics  . Alcohol use: No    Alcohol/week: 0.0 standard drinks  . Drug use: No    Home Medications Prior to Admission medications   Medication Sig Start Date End Date Taking? Authorizing Provider  acetaminophen (TYLENOL) 325 MG tablet Take 650 mg by mouth every 6 (six) hours as needed.   Yes [provider]  albuterol (VENTOLIN HFA) 108 (90 Base) MCG/ACT inhaler Inhale 2 puffs into the lungs every 4 (four) hours as needed for wheezing or shortness of breath. 07/13/19  Yes Tower, Wynelle Fanny, MD  ALPRAZolam (XANAX) 0.5 MG tablet TAKE 1/2 TO 1 TABLET BY MOUTH TWICE A DAY AS NEEDED FOR ANXIETY Patient taking differently: Take 0.5 mg by mouth in the morning and at bedtime.  01/19/20  Yes Tower, Wynelle Fanny, MD  amLODipine (NORVASC) 10 MG tablet Take 1 tablet (10 mg total) by mouth daily. 09/30/18  Yes Tower, Wynelle Fanny, MD  aspirin 325 MG tablet Take 325 mg by mouth daily.    Yes [provider]  Blood Glucose Monitoring Suppl (ONE TOUCH ULTRA 2) w/Device KIT Check blood sugar once daily and as directed. Dx E11.9 08/27/18  Yes Tower, Wynelle Fanny, MD  calcium-vitamin D (OSCAL WITH D) 500-200 MG-UNIT per tablet Take 1 tablet by mouth daily.    Yes [provider]  Cholecalciferol (VITAMIN D) 1000 UNITS capsule Take 1,000 Units by mouth daily.    Yes [provider]  docusate sodium (COLACE) 100 MG capsule Take 200 mg by mouth at bedtime.   Yes [provider]  ferrous sulfate 325 (65 FE) MG tablet Take 325 mg by mouth 2 (two) times daily.   Yes [provider]  gemfibrozil (LOPID) 600 MG tablet TAKE 1 TABLET BY  MOUTH EVERY DAY Patient taking differently: Take 600 mg by mouth daily.  09/08/19  Yes Tower, Marne A, MD  glipiZIDE (GLUCOTROL XL) 2.5 MG 24 hr tablet TAKE 1 TABLET BY MOUTH EVERY DAY WITH BREAKFAST Patient taking differently: Take 2.5 mg by mouth daily with breakfast.  07/20/19  Yes Tower, Wynelle Fanny, MD  hydrALAZINE (APRESOLINE) 100 MG tablet Take 100 mg by mouth 3 (three) times daily.  08/30/14  Yes [provider]  metoprolol succinate (TOPROL-XL) 25 MG 24 hr tablet Take 0.5 tablets (12.5 mg total) by mouth 2 (two) times daily. 09/30/18  Yes Tower, Wynelle Fanny, MD  mirtazapine (  REMERON) 15 MG tablet TAKE 1/2 TABLET BY MOUTH AT BEDTIME Patient taking differently: Take 7.5 mg by mouth at bedtime.  06/16/19  Yes Tower, Wynelle Fanny, MD  Multiple Vitamin (MULTIVITAMIN) capsule Take 1 capsule by mouth daily.    Yes [provider]  Olopatadine HCl (PATADAY OP) Place 1 drop into both eyes daily.   Yes [provider]  OneTouch Delica Lancets 69G MISC CHECK BLOOD SUGAR ONCE DAILY AND AS DIRECTED. DX E11.9 Patient taking differently: 1 Device by Other route daily.  10/23/19  Yes Tower, Wynelle Fanny, MD  ONETOUCH ULTRA test strip CHECK BLOOD SUGAR ONCE DAILY AND AS DIRECTED. DX E11.9 12/22/19  Yes Tower, Wynelle Fanny, MD  sertraline (ZOLOFT) 50 MG tablet TAKE 1 TABLET BY MOUTH EVERY DAY Patient taking differently: Take 50 mg by mouth daily.  09/08/19  Yes Tower, Wynelle Fanny, MD  trandolapril (MAVIK) 4 MG tablet Take 1 tablet (4 mg total) by mouth daily. 09/30/18  Yes Tower, Wynelle Fanny, MD  vitamin B-12 (CYANOCOBALAMIN) 1000 MCG tablet Take 1,000 mcg by mouth daily.    Yes [provider]  Polyvinyl Alcohol-Povidone (REFRESH OP) Place 1 drop into both eyes daily as needed (dry eyes). Patient not taking: Reported on 02/12/2020    [provider]  valACYclovir (VALTREX) 500 MG tablet Take 1 tablet (500 mg total) by mouth daily. Patient not taking: Reported on 02/12/2020 12/21/19   Abner Greenspan, MD      Allergies    Diclofenac sodium and Pioglitazone  Review of Systems   Review of Systems  Constitutional: Negative for chills and fever.  Respiratory: Negative for shortness of breath.   Cardiovascular: Negative for chest pain.  Gastrointestinal: Positive for nausea. Negative for abdominal pain and vomiting.  Genitourinary: Negative for dysuria.  Musculoskeletal: Negative for neck pain.  Neurological: Positive for dizziness, weakness (residual left sided), light-headedness and headaches. Negative for syncope and numbness.  All other systems reviewed and are negative.   Physical Exam Updated Vital Signs BP (!) 146/79 (BP Location: Left Arm)   Pulse (!) 59   Temp 98.3 F (36.8 C) (Oral)   Resp (!) 21   SpO2 98%   Physical Exam Vitals and nursing note reviewed.  Constitutional:      General: She is not in acute distress.    Appearance: Normal appearance. She is well-developed. She is not ill-appearing.     Comments: Calm, cooperative. Flat affect  HENT:     Head: Normocephalic and atraumatic.     Right Ear: Tympanic membrane normal.     Left Ear: Tympanic membrane normal.     Ears:     Comments: Hearing aids bilaterally  Eyes:     General: No scleral icterus.       Right eye: No discharge.        Left eye: No discharge.     Conjunctiva/sclera: Conjunctivae normal.     Pupils: Pupils are equal, round, and reactive to light.  Cardiovascular:     Rate and Rhythm: Regular rhythm. Bradycardia present.  Pulmonary:     Effort: Pulmonary effort is normal. No respiratory distress.     Breath sounds: Normal breath sounds.  Abdominal:     General: There is no distension.     Palpations: Abdomen is soft.     Tenderness: There is no abdominal tenderness.  Musculoskeletal:     Cervical back: Normal range of motion.  Skin:    General: Skin is warm and dry.  Neurological:     Mental Status: She is alert and oriented to person, place, and time.     Comments: Mental Status:   Alert, oriented, thought content appropriate, able to give a coherent history. Speech fluent. Mildly slurred speech which is her baseline. Able to follow 2 step commands without difficulty.  Cranial Nerves:  II:  Peripheral visual fields grossly normal. R pupil is tear drop shaped III,IV, VI: ptosis not present, extra-ocular motions intact bilaterally. Mild lateral nystagmus noted V,VII: mild left sided facial droop noted which is her baseline. Facial light touch sensation equal VIII: hearing grossly normal to voice  X: uvula elevates symmetrically  XI: bilateral shoulder shrug symmetric and strong XII: midline tongue extension without fassiculations Motor:  Normal tone. 5/5 in right upper and lower extremities bilaterally including strong and equal grip strength and dorsiflexion/plantar flexion. 4/5 in LUE. 5/5 in LLE Sensory: Pinprick and light touch normal in all extremities.  Cerebellar: Mild dysmetria on the left Gait: Unable to stand CV: distal pulses palpable throughout    Psychiatric:        Behavior: Behavior normal.     ED Results / Procedures / Treatments   Labs (all labs ordered are listed, but only abnormal results are displayed) Labs Reviewed  BASIC METABOLIC PANEL - Abnormal; Notable for the following components:      Result Value   Glucose, Bld 123 (*)    Creatinine, Ser 1.29 (*)    Calcium 8.6 (*)    GFR calc non Af Amer 37 (*)    GFR calc Af Amer 43 (*)    All other components within normal limits  CBC - Abnormal; Notable for the following components:   Hemoglobin 11.1 (*)    All other components within normal limits  URINALYSIS, ROUTINE W REFLEX MICROSCOPIC - Abnormal; Notable for the following components:   Leukocytes,Ua SMALL (*)    Bacteria, UA RARE (*)    All other components within normal limits  SARS CORONAVIRUS 2 BY RT PCR (HOSPITAL ORDER, [facility])  CBG MONITORING, ED    EKG EKG Interpretation  Date/Time:  Friday  February 12 2020 12:10:52 EDT Ventricular Rate:  58 PR Interval:    QRS Duration: 106 QT Interval:  388 QTC Calculation: 381 R Axis:   -71 Text Interpretation: Interpretation limited secondary to artifact LAD, consider left anterior fascicular block Low voltage, precordial leads Consider anterior infarct Confirmed by Quintella Reichert [phone]) on 02/12/2020 1:25:37 PM   Radiology CT Head Wo Contrast  Result Date: 02/10/2020 CLINICAL DATA:  Dizziness. EXAM: CT HEAD WITHOUT CONTRAST TECHNIQUE: Contiguous axial images were obtained from the base of the skull through the vertex without intravenous contrast. COMPARISON:  October 31, 2010 FINDINGS: Brain: There is mild to moderate severity cerebral atrophy with widening of the extra-axial spaces and ventricular dilatation. There are areas of decreased attenuation within the white matter tracts of the supratentorial brain, consistent with microvascular disease changes. A chronic right basal ganglia lacunar infarct is seen. This is present on the prior study. Vascular: No hyperdense vessel or unexpected calcification. Skull: Normal. Negative for fracture or focal lesion. Sinuses/Orbits: No acute finding. Evidence of prior scleral banding is noted on the right. Other: None. IMPRESSION: 1. Generalized cerebral atrophy. 2. Chronic right basal ganglia lacunar infarct. 3. No acute intracranial abnormality. Electronically Signed   By: Virgina Norfolk M.D.   On: 02/10/2020 20:27    Procedures Procedures (including critical care time)  Medications Ordered in  ED Medications  sodium chloride flush (NS) 0.9 % injection 3 mL (3 mLs Intravenous Given 02/12/20 1258)  meclizine (ANTIVERT) tablet 25 mg (25 mg Oral Given 02/12/20 1255)  sodium chloride 0.9 % bolus 500 mL (0 mLs Intravenous Stopped 02/12/20 1330)  acetaminophen (TYLENOL) tablet 650 mg (650 mg Oral Given 02/12/20 1255)    ED Course  I have reviewed the triage vital signs and the nursing notes.  Pertinent  labs & imaging results that were available during my care of the patient were reviewed by me and considered in my medical decision making (see chart for details).  Clinical Course as of Feb 11 1513  Fri Feb 12, 2020  1500 Stable. Dizzy cant walk. Recent UTI dx with abx and discharged. Now with HA and presyncope. Has residual stroke finding from old stroke. Left sided. Coming from home. Care giver Monday -Saturday. MRI pending to rule out posterior stroke.     [AL]    Clinical Course User Index [AL] Delma Post, MD   86 year old female presents with dizziness vs near syncope. She describes her symptoms as dizziness and spinning but also states it feels like she is going to pass out. BP is elevated here and she is mildly bradycardic. Her neurologic exam shows lateral nystagmus and mild dysmetria on the left side. She also has chronic LUE weakness which is at baseline. She is unable to ambulate due to dizziness. EKG shows sinus bradycardia. Will obtain labs, MRI brain. Shared visit with Dr. Ralene Bathe.  Labs are unrevealing. Repeat UA looks improved. MRI brain is pending. Orthostatics were attempted but she couldn't stand to complete this even after fluids and meclizine. Will admit for vertigo vs near sycnope work up due to inability to walk pending MRI results. Discussed with Dr. Tamala Julian with [facility] who will admit.   MDM Rules/Calculators/A&P                           Final Clinical Impression(s) / ED Diagnoses Final diagnoses:  Dizziness    Rx / DC Orders ED Discharge Orders    None       Recardo Evangelist, PA-C 02/12/20 1539    Quintella Reichert, MD 02/15/20 [phone]

## 2020-02-12 NOTE — ED Notes (Signed)
[facility] contact for any results and updates .

## 2020-02-12 NOTE — ED Triage Notes (Signed)
Per [facility] pt from home w/dizziness and weakness x4 days, dc's here on Wednesday, dx w/UTI, treated with ABX, CT negative. Pt wants to f/u from last visit d/t ongoing dizziness and weakness. 12 lead unremarkable   HOH   BP 182/80 HR 62 96% RA  CBG 133 97.2 temp

## 2020-02-13 DIAGNOSIS — I1310 Hypertensive heart and chronic kidney disease without heart failure, with stage 1 through stage 4 chronic kidney disease, or unspecified chronic kidney disease: Secondary | ICD-10-CM | POA: Diagnosis present

## 2020-02-13 DIAGNOSIS — I69354 Hemiplegia and hemiparesis following cerebral infarction affecting left non-dominant side: Secondary | ICD-10-CM | POA: Diagnosis not present

## 2020-02-13 DIAGNOSIS — Z7401 Bed confinement status: Secondary | ICD-10-CM | POA: Diagnosis not present

## 2020-02-13 DIAGNOSIS — M2550 Pain in unspecified joint: Secondary | ICD-10-CM | POA: Diagnosis not present

## 2020-02-13 DIAGNOSIS — Z20822 Contact with and (suspected) exposure to covid-19: Secondary | ICD-10-CM | POA: Diagnosis not present

## 2020-02-13 DIAGNOSIS — I129 Hypertensive chronic kidney disease with stage 1 through stage 4 chronic kidney disease, or unspecified chronic kidney disease: Secondary | ICD-10-CM | POA: Diagnosis not present

## 2020-02-13 DIAGNOSIS — Z66 Do not resuscitate: Secondary | ICD-10-CM | POA: Diagnosis not present

## 2020-02-13 DIAGNOSIS — N1832 Chronic kidney disease, stage 3b: Secondary | ICD-10-CM | POA: Diagnosis present

## 2020-02-13 DIAGNOSIS — E1122 Type 2 diabetes mellitus with diabetic chronic kidney disease: Secondary | ICD-10-CM | POA: Diagnosis not present

## 2020-02-13 DIAGNOSIS — R42 Dizziness and giddiness: Secondary | ICD-10-CM | POA: Diagnosis not present

## 2020-02-13 DIAGNOSIS — I959 Hypotension, unspecified: Secondary | ICD-10-CM | POA: Diagnosis not present

## 2020-02-13 DIAGNOSIS — E785 Hyperlipidemia, unspecified: Secondary | ICD-10-CM | POA: Diagnosis not present

## 2020-02-13 DIAGNOSIS — B9620 Unspecified Escherichia coli [E. coli] as the cause of diseases classified elsewhere: Secondary | ICD-10-CM | POA: Diagnosis not present

## 2020-02-13 DIAGNOSIS — D649 Anemia, unspecified: Secondary | ICD-10-CM | POA: Diagnosis present

## 2020-02-13 DIAGNOSIS — R29810 Facial weakness: Secondary | ICD-10-CM | POA: Diagnosis present

## 2020-02-13 DIAGNOSIS — F419 Anxiety disorder, unspecified: Secondary | ICD-10-CM | POA: Diagnosis present

## 2020-02-13 DIAGNOSIS — E860 Dehydration: Secondary | ICD-10-CM | POA: Diagnosis present

## 2020-02-13 DIAGNOSIS — E11319 Type 2 diabetes mellitus with unspecified diabetic retinopathy without macular edema: Secondary | ICD-10-CM | POA: Diagnosis not present

## 2020-02-13 DIAGNOSIS — F329 Major depressive disorder, single episode, unspecified: Secondary | ICD-10-CM | POA: Diagnosis present

## 2020-02-13 DIAGNOSIS — R69 Illness, unspecified: Secondary | ICD-10-CM | POA: Diagnosis not present

## 2020-02-13 DIAGNOSIS — I160 Hypertensive urgency: Secondary | ICD-10-CM | POA: Diagnosis present

## 2020-02-13 DIAGNOSIS — N3000 Acute cystitis without hematuria: Secondary | ICD-10-CM | POA: Diagnosis not present

## 2020-02-13 DIAGNOSIS — Z79899 Other long term (current) drug therapy: Secondary | ICD-10-CM | POA: Diagnosis not present

## 2020-02-13 DIAGNOSIS — N1831 Chronic kidney disease, stage 3a: Secondary | ICD-10-CM | POA: Diagnosis not present

## 2020-02-13 DIAGNOSIS — N390 Urinary tract infection, site not specified: Secondary | ICD-10-CM | POA: Diagnosis not present

## 2020-02-13 DIAGNOSIS — Z7984 Long term (current) use of oral hypoglycemic drugs: Secondary | ICD-10-CM | POA: Diagnosis not present

## 2020-02-13 DIAGNOSIS — Z7982 Long term (current) use of aspirin: Secondary | ICD-10-CM | POA: Diagnosis not present

## 2020-02-13 LAB — BASIC METABOLIC PANEL
Anion gap: 10 (ref 5–15)
BUN: 15 mg/dL (ref 8–23)
CO2: 25 mmol/L (ref 22–32)
Calcium: 8.4 mg/dL — ABNORMAL LOW (ref 8.9–10.3)
Chloride: 104 mmol/L (ref 98–111)
Creatinine, Ser: 1.18 mg/dL — ABNORMAL HIGH (ref 0.44–1.00)
GFR calc Af Amer: 48 mL/min — ABNORMAL LOW (ref >60)
GFR calc non Af Amer: 41 mL/min — ABNORMAL LOW (ref >60)
Glucose, Bld: 111 mg/dL — ABNORMAL HIGH (ref 70–99)
Potassium: 4 mmol/L (ref 3.5–5.1)
Sodium: 139 mmol/L (ref 135–145)

## 2020-02-13 LAB — URINE CULTURE: Culture: >=100000 — AB

## 2020-02-13 LAB — CBC
HCT: 35.2 % — ABNORMAL LOW (ref 36.0–46.0)
Hemoglobin: 10.8 g/dL — ABNORMAL LOW (ref 12.0–15.0)
MCH: 28.3 pg (ref 26.0–34.0)
MCHC: 30.7 g/dL (ref 30.0–36.0)
MCV: 92.4 fL (ref 80.0–100.0)
Platelets: 161 10*3/uL (ref 150–400)
RBC: 3.81 MIL/uL — ABNORMAL LOW (ref 3.87–5.11)
RDW: 13.5 % (ref 11.5–15.5)
WBC: 4.3 10*3/uL (ref 4.0–10.5)
nRBC: 0 % (ref 0.0–0.2)

## 2020-02-13 LAB — GLUCOSE, CAPILLARY
Glucose-Capillary: 99 mg/dL (ref 70–99)
Glucose-Capillary: 158 mg/dL — ABNORMAL HIGH (ref 70–99)
Glucose-Capillary: 79 mg/dL (ref 70–99)
Glucose-Capillary: 199 mg/dL — ABNORMAL HIGH (ref 70–99)

## 2020-02-13 MED ORDER — LACTATED RINGERS IV SOLN: INTRAVENOUS | Status: DC

## 2020-02-13 MED ORDER — LACTATED RINGERS IV SOLN: INTRAVENOUS | Status: AC

## 2020-02-13 MED ORDER — FOSFOMYCIN TROMETHAMINE 3 G PO PACK
3.0000 g | PACK | ORAL | Status: DC
  Administered 2020-02-13: 3 g via ORAL
  Filled 2020-02-13: qty 3

## 2020-02-13 NOTE — Evaluation (Signed)
Physical Therapy Evaluation Patient Details Name: Renee [name] MRN: [id number] DOB: May 26, 1929 Today's Date: 02/13/2020   History of Present Illness  Renee [name] is a 86 y.o. female with medical history significant of CVA with residual left-sided weakness, diabetes mellitus type 2, vertigo, hypertension, hyperlipidemia, CKD stage 3, and recurrent UTIs.  Admitted for dizziness with continued work-up.     Clinical Impression  Patient admitted with the above listed diagnosis. Patient reports she required light assist for ADLs prior to admission, however was ambulatory in home with RW. Patient today requiring Min A for bed mobility with heavy Mod A for sit to stand transfer at bedside. Unable to progress away from bedside due to unsteadiness and safety precautions. Orthostatic vitals measured (listed below) with patient reporting mild improvement in symptoms while standing. Due to current functional status and need for increased assist with mobility will recommend short term rehab at discharge. Will continue to follow acutely.     Follow Up Recommendations SNF;Supervision/Assistance - 24 hour    Equipment Recommendations  None recommended by PT    Recommendations for Other Services       Precautions / Restrictions Precautions Precautions: Fall Restrictions Weight Bearing Restrictions: No      Mobility  Bed Mobility Overal bed mobility: Needs Assistance Bed Mobility: Supine to Sit;Sit to Supine     Supine to sit: Min assist Sit to supine: Min assist   General bed mobility comments: Min A for LE management  Transfers Overall transfer level: Needs assistance Equipment used: Rolling walker (2 wheeled) Transfers: Sit to/from Stand Sit to Stand: Mod assist         General transfer comment: Mod A to power upo from bedside; heavy orward trunbk flexion - little use of L UE for support in standing  Ambulation/Gait             General Gait Details: unable to progress  away from bedside  Stairs            Wheelchair Mobility    Modified Rankin (Stroke Patients Only)       Balance Overall balance assessment: Needs assistance Sitting-balance support: Bilateral upper extremity supported;Feet supported Sitting balance-Leahy Scale: Poor Sitting balance - Comments: Mod A to maintain balance in standing with use of RW                                     Pertinent Vitals/Pain Pain Assessment: No/denies pain    Home Living Family/patient expects to be discharged to:: Private residence Living Arrangements: Alone Available Help at Discharge: Family;Available PRN/intermittently;Personal care attendant Type of Home: House Home Access: Ramped entrance     Home Layout: One level Home Equipment: Walker - 2 wheels Additional Comments: lives alone with [facility] aide x6 hours daily.     Prior Function Level of Independence: Needs assistance   Gait / Transfers Assistance Needed: use of RW  ADL's / Homemaking Assistance Needed: Caregiver assist lightly with ADLs        Hand Dominance   Dominant Hand: Right    Extremity/Trunk Assessment   Upper Extremity Assessment Upper Extremity Assessment: Defer to OT evaluation    Lower Extremity Assessment Lower Extremity Assessment: Generalized weakness    Cervical / Trunk Assessment Cervical / Trunk Assessment: Kyphotic  Communication   Communication: HOH  Cognition Arousal/Alertness: Awake/alert Behavior During Therapy: WFL for tasks assessed/performed Overall Cognitive Status: Within Functional Limits for  tasks assessed                                        General Comments General comments (skin integrity, edema, etc.): BP supine: 158/65; BP sitting 1 minute: 142/79; BP sitting 3 minute: 137/71; BP standing 1 minute: 155/77; BP standing 3 minute 153/80    Exercises     Assessment/Plan    PT Assessment Patient needs continued PT services  PT Problem List  Decreased strength;Decreased activity tolerance;Decreased balance;Decreased mobility;Decreased knowledge of use of DME;Decreased safety awareness       PT Treatment Interventions DME instruction;Gait training;Functional mobility training;Therapeutic activities;Therapeutic exercise;Balance training;Neuromuscular re-education;Patient/family education    PT Goals (Current goals can be found in the Care Plan section)  Acute Rehab PT Goals Patient Stated Goal: reduce dizziness PT Goal Formulation: With patient Time For Goal Achievement: 02/27/20 Potential to Achieve Goals: Good    Frequency Min 2X/week   Barriers to discharge Decreased caregiver support      Co-evaluation               AM-PAC PT "6 Clicks" Mobility  Outcome Measure Help needed turning from your back to your side while in a flat bed without using bedrails?: A Lot Help needed moving from lying on your back to sitting on the side of a flat bed without using bedrails?: A Lot Help needed moving to and from a bed to a chair (including a wheelchair)?: A Lot Help needed standing up from a chair using your arms (e.g., wheelchair or bedside chair)?: A Lot Help needed to walk in hospital room?: Total Help needed climbing 3-5 steps with a railing? : Total 6 Click Score: 10    End of Session Equipment Utilized During Treatment: Gait belt Activity Tolerance: Patient tolerated treatment well;Patient limited by fatigue Patient left: in bed;with call bell/phone within reach Nurse Communication: Mobility status PT Visit Diagnosis: Unsteadiness on feet (R26.81);Other abnormalities of gait and mobility (R26.89);Muscle weakness (generalized) (M62.81)    Time: 2111-7356 PT Time Calculation (min) (ACUTE ONLY): 24 min   Charges:   PT Evaluation $PT Eval High Complexity: 1 High PT Treatments $Therapeutic Activity: 8-22 mins       Milana Na, PT, DPT Supplemental Physical Therapist 02/13/20 10:08 AM Office:  [phone]

## 2020-02-13 NOTE — Plan of Care (Signed)
  Problem: Education: Goal: Knowledge of General Education information will improve Description Including pain rating scale, medication(s)/side effects and non-pharmacologic comfort measures Outcome: Progressing   

## 2020-02-13 NOTE — Progress Notes (Signed)
PROGRESS NOTE  Renee [name] VPX:[id number] DOB: 12/22/1928 DOA: 02/12/2020 PCP: Abner Greenspan, MD  HPI/Recap of past 24 hours: HPI: Renee [name] is a 86 y.o. female with medical history significant of CVA with residual left-sided weakness, diabetes mellitus type 2, vertigo, hypertension, hyperlipidemia, CKD stage 3, and recurrent UTIs.  At baseline she ambulates with the use of a walker.  She presents with complaints of dizziness over the last 3 days.  She reports feeling as though the room is spinning around her and as if she may pass out.  Symptoms only present when changing positions and resolved when laying still.  Notes associated symptoms of nausea when symptoms are present.  Denies having any vomiting, fever, loss of consciousness, change in appetite, or diarrhea.  Patient had been seen in the emergency department 2 days ago for the same symptoms.  CT scan revealed chronic right basal ganglia infarct without acute abnormalities.  She was diagnosed with a urinary tract infection and given one-time dose of fosfomycin.  She denies any complaints urinary frequency or dysuria symptoms since receiving treatment.  ED Course: Upon admission into the emergency department was pulse 52-59, blood pressures elevated up to 184/76, and all other vital signs within normal limits.  Labs significant for low 11.1, BUN 15, and creatinine 1.29.  MRI of the brain was obtained and official read pending.  Patient had been given 500 mL of normal saline IV fluids, 25 mg of meclizine, and Tylenol.   02/13/20: Seen and examined at her bedside.  Very hard of hearing.  Alert and oriented x2.  She reports nausea and dizziness.  PT assessment recommended SNF.  TOC team consulted to assist with SNF placement.  We will start IV fluids to avoid dehydration.  IV antiemetics as needed.  Assessment/Plan: Principal Problem:   Dizziness Active Problems:   Type 2 diabetes, controlled, with retinopathy (HCC)   Hemiplegia,  late effect of cerebrovascular disease (HCC)   Anxiety and depression   Hypertensive urgency   Normocytic anemia  Dizziness with history of CVA and vertigo:  Presented with complaints of dizziness with sensation of room spinning around her and feeling like she may pass out.   History of CVA and vertigo, relays no prior vestibular treatment.  CT head and MRI brain negative for any acute intracranial findings.  Assessed by PT with recommendation for SNF OT evaluation is pending.  Continue PT OT with assistance and fall precautions  Dehydration and poor oral intake due to nausea Start IV fluid hydration Continue IV antiemetics as needed Monitor QTC while on IV antiemetics Encourage oral intake as tolerated  Resolved bradycardia:  Presented with heart rates in the 50s.  -Continue to hold metoprolol -Continue to monitor on telemetry  Uncontrolled hypertension: Presented with SBP  greater than 180  Continue home antihypertensives, amlodipine 10 mg daily, hydralazine 100 mg 3 times daily Metoprolol on hold Resume metoprolol if blood pressure persistently elevated, bradycardia has resolved.   -Hydralazine IV as needed with parameters Continue to monitor blood pressure  Recent UTI Abnormal urinalysis: Patient was diagnosed with a urinary tract infection 2 days prior to presentation and treated with 1 dose fosfomycin.   Urine culture taken on 02/10/2020 grew E. coli, sensitivities are pending.  Repeat dose of fosfomycin every 3 days x 2 doses.  Follow sensitivities  Chronic kidney disease stage IIIA:  Baseline creatinine appears to be 1.1 with GFR of 41.   She is currently at her baseline renal function.  Continue to avoid nephrotoxins  Monitor urine output  Repeat BMP in the morning   History of CVA with residual deficits:  Patient with residual left-sided weakness unchanged. PT assessment recommendation for SNF Fall precautions in place Continue PT OT with assistance and  fall precautions  Diabetes mellitus type 2, well controlled:  Last hemoglobin A1c noted to be 6.1 on 01/05/2020.   Continue to hold off oral hypoglycemics  Continue insulin sliding scale  Chronic normocytic anemia:  Presented with hemoglobin 11.1 and MCV 93 No overt bleeding Continue to monitor, transfuse with hemoglobin less than 7.0.  Dyslipidemia:  Continue Home medications include gemfibrozil 600 mg daily  Anxiety and depression -Continue Xanax and Zoloft  CODE STATUS: DNR   DVT prophylaxis: lovneox subcu daily  Family Communication: Sister updated at bedside by admitting provider.  Consults called: None    Status is: Observation    Dispo:  Patient From: Home.  Lives alone. Home  Planned Disposition: SNF. Home  Expected discharge date: 02/14/2020 or 02/15/2020  Medically stable for discharge: Patient is currently not stable for discharge due to ongoing nausea, dizziness, with poor oral intake. On IV fluid to avoid dehydration.   Objective: Vitals:   02/13/20 0150 02/13/20 0539 02/13/20 0545 02/13/20 0826  BP: 131/80 (!) 184/57 (!) 184/57   Pulse: 98 63 63   Resp: 18 18 18    Temp: 97.8 F (36.6 C) 97.6 F (36.4 C) 97.6 F (36.4 C) 97.8 F (36.6 C)  TempSrc: Oral  Oral Oral  SpO2: 96% 96% 98%   Weight:      Height:        Intake/Output Summary (Last 24 hours) at 02/13/2020 1051 Last data filed at 02/13/2020 0840 Gross per 24 hour  Intake 60 ml  Output 1300 ml  Net -1240 ml   Filed Weights   02/12/20 1807  Weight: 81.6 kg    Exam:  . General: 86 y.o. year-old female well developed well nourished in no acute distress.  Alert and oriented x2.  Dry mucosal membrane. . Cardiovascular: Regular rate and rhythm with no rubs or gallops.  No thyromegaly or JVD noted.   Marland Kitchen Respiratory: Clear to auscultation with no wheezes or rales. Good inspiratory effort. . Abdomen: Soft nontender nondistended with normal bowel sounds x4 quadrants. . Musculoskeletal:  No lower extremity edema.  Marland Kitchen Psychiatry: Mood is appropriate for condition and setting   Data Reviewed: CBC: Recent Labs  Lab 02/10/20 1123 02/12/20 1331 02/13/20 0304  WBC 6.3 4.5 4.3  HGB 12.7 11.1* 10.8*  HCT 41.1 36.3 35.2*  MCV 94.1 93.8 92.4  PLT 187 152 161   Basic Metabolic Panel: Recent Labs  Lab 02/10/20 1123 02/12/20 1331 02/13/20 0304  NA 141 142 139  K 4.5 4.3 4.0  CL 105 106 104  CO2 25 26 25   GLUCOSE 135* 123* 111*  BUN 18 15 15   CREATININE 1.42* 1.29* 1.18*  CALCIUM 9.0 8.6* 8.4*   GFR: Estimated Creatinine Clearance: 32.6 mL/min (A) (by C-G formula based on SCr of 1.18 mg/dL (H)). Liver Function Tests: No results for input(s): AST, ALT, ALKPHOS, BILITOT, PROT, ALBUMIN in the last 168 hours. No results for input(s): LIPASE, AMYLASE in the last 168 hours. No results for input(s): AMMONIA in the last 168 hours. Coagulation Profile: No results for input(s): INR, PROTIME in the last 168 hours. Cardiac Enzymes: No results for input(s): CKTOTAL, CKMB, CKMBINDEX, TROPONINI in the last 168 hours. BNP (last 3 results) No results for input(s):  PROBNP in the last 8760 hours. HbA1C: No results for input(s): HGBA1C in the last 72 hours. CBG: Recent Labs  Lab 02/10/20 1920 02/12/20 1751 02/12/20 2140 02/13/20 0640  GLUCAP 104* 100* 176* 99   Lipid Profile: No results for input(s): CHOL, HDL, LDLCALC, TRIG, CHOLHDL, LDLDIRECT in the last 72 hours. Thyroid Function Tests: No results for input(s): TSH, T4TOTAL, FREET4, T3FREE, THYROIDAB in the last 72 hours. Anemia Panel: No results for input(s): VITAMINB12, FOLATE, FERRITIN, TIBC, IRON, RETICCTPCT in the last 72 hours. Urine analysis:    Component Value Date/Time   COLORURINE YELLOW 02/12/2020 [place] 02/12/2020 1423   LABSPEC 1.014 02/12/2020 1423   PHURINE 6.0 02/12/2020 1423   GLUCOSEU NEGATIVE 02/12/2020 1423   HGBUR NEGATIVE 02/12/2020 1423   HGBUR negative 03/27/2010 [place] 02/12/2020 1423   BILIRUBINUR neg. 06/18/2014 [place] 02/12/2020 1423   PROTEINUR NEGATIVE 02/12/2020 1423   UROBILINOGEN 0.2 06/18/2014 1155   UROBILINOGEN 0.2 09/04/2012 1135   NITRITE NEGATIVE 02/12/2020 1423   LEUKOCYTESUR SMALL (A) 02/12/2020 1423   Sepsis Labs: @LABRCNTIP (procalcitonin:4,lacticidven:4)  ) Recent Results (from the past 240 hour(s))  Urine culture     Status: Abnormal (Preliminary result)   Collection Time: 02/10/20  8:50 PM   Specimen: Urine  Result Value Ref Range Status   Specimen Description Urine  Final   Special Requests   Final    NONE Performed at [facility], [place] [address], [place], [place] [address]    Culture >=100,000 COLONIES/mL ESCHERICHIA COLI (A)  Final   Report Status PENDING  Incomplete  SARS Coronavirus 2 by RT PCR (hospital order, performed in [facility] hospital lab) Nasopharyngeal Nasopharyngeal Swab     Status: None   Collection Time: 02/12/20  6:44 PM   Specimen: Nasopharyngeal Swab  Result Value Ref Range Status   SARS Coronavirus 2 NEGATIVE NEGATIVE Final    Comment: (NOTE) SARS-CoV-2 target nucleic acids are NOT DETECTED.  The SARS-CoV-2 RNA is generally detectable in upper and lower respiratory specimens during the acute phase of infection. The lowest concentration of SARS-CoV-2 viral copies this assay can detect is 250 copies / mL. A negative result does not preclude SARS-CoV-2 infection and should not be used as the sole basis for treatment or other patient management decisions.  A negative result may occur with improper specimen collection / handling, submission of specimen other than nasopharyngeal swab, presence of viral mutation(s) within the areas targeted by this assay, and inadequate number of viral copies (<250 copies / mL). A negative result must be combined with clinical observations, patient history, and epidemiological information.  Fact Sheet for Patients:    StrictlyIdeas.no  Fact Sheet for Healthcare Providers: BankingDealers.co.za  This test is not yet approved or  cleared by the [place] FDA and has been authorized for detection and/or diagnosis of SARS-CoV-2 by FDA under an Emergency Use Authorization (EUA).  This EUA will remain in effect (meaning this test can be used) for the duration of the COVID-19 declaration under Section 564(b)(1) of the Act, 21 U.S.C. section 360bbb-3(b)(1), unless the authorization is terminated or revoked sooner.  Performed at [facility], [place] [address], [place], [place] [address]       Studies: MR BRAIN WO CONTRAST  Result Date: 02/12/2020 CLINICAL DATA:  Dizziness. History of stroke with residual left hemiparesis. EXAM: MRI HEAD WITHOUT CONTRAST TECHNIQUE: Multiplanar, multiecho pulse sequences of the brain and surrounding  structures were obtained without intravenous contrast. COMPARISON:  Head CT 02/10/2020 and MRI 06/02/2010 FINDINGS: Brain: No acute infarct, mass, midline shift, or extra-axial fluid collection is identified. A chronic infarct is again noted involving the right basal ganglia and corona radiata with associated chronic blood products and ex vacuo dilatation of the body of the right lateral ventricle. Small T2 hyperintensities elsewhere in the cerebral white matter bilaterally have mildly progressed from the prior MRI and are nonspecific but compatible with mild chronic small vessel ischemic disease. There is mild cerebral atrophy. Vascular: Major intracranial vascular flow voids are preserved. Skull and upper cervical spine: Unremarkable bone marrow signal. Sinuses/Orbits: Bilateral cataract extraction. Right scleral buckle. Clear paranasal sinuses. Left mastoid and middle ear effusions, also present in 2011. Other: None. IMPRESSION: 1. No acute intracranial abnormality. 2. Mild chronic small vessel ischemic disease. 3. Chronic right  basal ganglia and deep white matter infarct. 4. Chronic or recurrent left mastoid and middle ear effusions. Electronically Signed   By: Logan Bores M.D.   On: 02/12/2020 16:17    Scheduled Meds: . ALPRAZolam  0.5 mg Oral BID  . amLODipine  10 mg Oral Daily  . aspirin  325 mg Oral Daily  . docusate sodium  200 mg Oral QHS  . enoxaparin (LOVENOX) injection  40 mg Subcutaneous Q24H  . ferrous sulfate  325 mg Oral BID  . fosfomycin  3 g Oral Q3 days  . gemfibrozil  600 mg Oral Daily  . hydrALAZINE  100 mg Oral TID  . hydrocortisone cream   Topical BID  . insulin aspart  0-9 Units Subcutaneous TID WC  . mirtazapine  7.5 mg Oral QHS  . olopatadine  1 drop Both Eyes Daily  . sertraline  50 mg Oral Daily  . sodium chloride flush  3 mL Intravenous Q12H  . trandolapril  4 mg Oral Daily  . vitamin B-12  1,000 mcg Oral Daily    Continuous Infusions:   LOS: 0 days     Kayleen Memos, MD [facility] Pager [phone]  If 7PM-7AM, please contact night-coverage [email or web address] Password [facility] 02/13/2020, 10:51 AM

## 2020-02-14 LAB — GLUCOSE, CAPILLARY
Glucose-Capillary: 102 mg/dL — ABNORMAL HIGH (ref 70–99)
Glucose-Capillary: 197 mg/dL — ABNORMAL HIGH (ref 70–99)
Glucose-Capillary: 109 mg/dL — ABNORMAL HIGH (ref 70–99)
Glucose-Capillary: 201 mg/dL — ABNORMAL HIGH (ref 70–99)

## 2020-02-14 MED ORDER — METOPROLOL SUCCINATE ER 25 MG PO TB24
12.5000 mg | ORAL_TABLET | Freq: Two times a day (BID) | ORAL | Status: DC
  Administered 2020-02-14 – 2020-02-22 (×17): 12.5 mg via ORAL
  Filled 2020-02-14 (×17): qty 1

## 2020-02-14 NOTE — Progress Notes (Signed)
PROGRESS NOTE  Renee [name] YDX:[id number] DOB: 09-Mar-1929 DOA: 02/12/2020 PCP: Abner Greenspan, MD  HPI/Recap of past 24 hours: HPI: Renee [name] is a 86 y.o. female with medical history significant of CVA with residual left-sided weakness, diabetes mellitus type 2, vertigo, hypertension, hyperlipidemia, CKD stage 3, and recurrent UTIs.  At baseline she ambulates with the use of a walker.  She presents with complaints of dizziness over the last 3 days.  She reports feeling as though the room is spinning around her and as if she may pass out.  Symptoms only present when changing positions and resolved when laying still.  Notes associated symptoms of nausea when symptoms are present.  Denies having any vomiting, fever, loss of consciousness, change in appetite, or diarrhea.  Patient had been seen in the emergency department 2 days ago for the same symptoms.  CT scan revealed chronic right basal ganglia infarct without acute abnormalities.  She was diagnosed with a urinary tract infection and given one-time dose of fosfomycin.  She denies any complaints urinary frequency or dysuria symptoms since receiving treatment.  ED Course: Upon admission into the emergency department was pulse 52-59, blood pressures elevated up to 184/76, and all other vital signs within normal limits.  Labs significant for low 11.1, BUN 15, and creatinine 1.29.  MRI of the brain was obtained and official read pending.  Patient had been given 500 mL of normal saline IV fluids, 25 mg of meclizine, and Tylenol.   02/14/20: Seen and examined at her bedside.  States she feels better today.  Denies dizziness or nausea while sitting up in bed.  PT has assessed and recommended SNF.  TOC assisting with SNF placement.    Assessment/Plan: Principal Problem:   Dizziness Active Problems:   Type 2 diabetes, controlled, with retinopathy (HCC)   Hemiplegia, late effect of cerebrovascular disease (HCC)   Anxiety and depression    Hypertensive urgency   Normocytic anemia  Improving dizziness with history of CVA and vertigo likely exacerbated by E. coli UTI:  Presented with complaints of dizziness with sensation of room spinning around her and feeling like she may pass out.   History of CVA and vertigo, relays no prior vestibular treatment and no prior antiemetics or vertigo medications.  CT head and MRI brain negative for any acute intracranial findings.  Assessed by PT with recommendation for SNF Continue PT OT with assistance and fall precautions TOC assisting with SNF placement  E. coli UTI Urine culture taken on 02/10/2020 grew greater than 100,000 colonies of E. coli, pansensitive. Treated with fosfomycin 1 dose every 3 days x 3 doses, previous doses were 02/10/2020 and 02/13/2020.  Improving dehydration and poor oral intake due to nausea Continue gentle IV fluid hydration Oral intake improving, denies nausea without dyspnea Continue to encourage increase oral protein calorie intake  Resolved bradycardia, likely iatrogenic:  Presented with heart rates in the 50s.  Resume home metoprolol  Uncontrolled hypertension: Presented with SBP  greater than 180  Continue home antihypertensives, amlodipine 10 mg daily, hydralazine 100 mg 3 times daily Resume home metoprolol -Hydralazine IV as needed with parameters Continue to monitor blood pressure  Chronic kidney disease stage IIIA:  Baseline creatinine appears to be 1.1 with GFR of 41.   She is currently at her baseline renal function.   Continue to avoid nephrotoxins  Monitor urine output  Repeat BMP in the morning   History of CVA with residual deficits:  Patient with residual left-sided weakness  unchanged. PT assessment recommendation for SNF Fall precautions in place Continue PT OT with assistance and fall precautions  Diabetes mellitus type 2, well controlled:  Last hemoglobin A1c noted to be 6.1 on 01/05/2020.   Continue to hold off oral  hypoglycemics  Continue insulin sliding scale  Chronic normocytic anemia:  Presented with hemoglobin 11.1 and MCV 93 No overt bleeding Continue to monitor, transfuse with hemoglobin less than 7.0.  Dyslipidemia:  Continue Home medications include gemfibrozil 600 mg daily  Anxiety and depression -Continue Xanax and Zoloft  CODE STATUS: DNR   DVT prophylaxis: lovneox subcu daily  Family Communication: Sister updated at bedside by admitting provider.  Consults called: None    Status is: Observation    Dispo:  Patient From: Home.  Lives alone. Home  Planned Disposition: SNF.   Expected discharge date: 02/15/2020  Medically stable for discharge:   Objective: Awaiting placement to SNF. Vitals:   02/13/20 1650 02/13/20 2031 02/14/20 0418 02/14/20 0956  BP: 137/83 (!) 147/55 (!) 150/66 (!) 178/80  Pulse: 69 72 63 68  Resp: 20 18 17 16   Temp: 98 F (36.7 C) 98 F (36.7 C) 97.8 F (36.6 C) 97.9 F (36.6 C)  TempSrc:    Oral  SpO2: 96% 96% 95% 97%  Weight:   84.2 kg   Height:        Intake/Output Summary (Last 24 hours) at 02/14/2020 1206 Last data filed at 02/14/2020 0947 Gross per 24 hour  Intake 1081.59 ml  Output 1875 ml  Net -793.41 ml   Filed Weights   02/12/20 1807 02/14/20 0418  Weight: 81.6 kg 84.2 kg    Exam:  . General: 86 y.o. year-old female well-developed well-nourished no distress.  Alert and oriented x3.  Hard of hearing.   . Cardiovascular: Regular rate and rhythm no rubs or gallops.   Marland Kitchen Respiratory: Clear to auscultation no wheezes no rales.   . Abdomen: Soft nontender normal bowel sounds present.   . Musculoskeletal: No lower extremity edema bilaterally.   Marland Kitchen Psychiatry: Mood is appropriate for condition and setting.  Data Reviewed: CBC: Recent Labs  Lab 02/10/20 1123 02/12/20 1331 02/13/20 0304  WBC 6.3 4.5 4.3  HGB 12.7 11.1* 10.8*  HCT 41.1 36.3 35.2*  MCV 94.1 93.8 92.4  PLT 187 152 151   Basic Metabolic Panel: Recent  Labs  Lab 02/10/20 1123 02/12/20 1331 02/13/20 0304  NA 141 142 139  K 4.5 4.3 4.0  CL 105 106 104  CO2 25 26 25   GLUCOSE 135* 123* 111*  BUN 18 15 15   CREATININE 1.42* 1.29* 1.18*  CALCIUM 9.0 8.6* 8.4*   GFR: Estimated Creatinine Clearance: 33.1 mL/min (A) (by C-G formula based on SCr of 1.18 mg/dL (H)). Liver Function Tests: No results for input(s): AST, ALT, ALKPHOS, BILITOT, PROT, ALBUMIN in the last 168 hours. No results for input(s): LIPASE, AMYLASE in the last 168 hours. No results for input(s): AMMONIA in the last 168 hours. Coagulation Profile: No results for input(s): INR, PROTIME in the last 168 hours. Cardiac Enzymes: No results for input(s): CKTOTAL, CKMB, CKMBINDEX, TROPONINI in the last 168 hours. BNP (last 3 results) No results for input(s): PROBNP in the last 8760 hours. HbA1C: No results for input(s): HGBA1C in the last 72 hours. CBG: Recent Labs  Lab 02/13/20 1149 02/13/20 1644 02/13/20 2031 02/14/20 0635 02/14/20 1119  GLUCAP 158* 79 199* 102* 197*   Lipid Profile: No results for input(s): CHOL, HDL, LDLCALC, TRIG, CHOLHDL, LDLDIRECT  in the last 72 hours. Thyroid Function Tests: No results for input(s): TSH, T4TOTAL, FREET4, T3FREE, THYROIDAB in the last 72 hours. Anemia Panel: No results for input(s): VITAMINB12, FOLATE, FERRITIN, TIBC, IRON, RETICCTPCT in the last 72 hours. Urine analysis:    Component Value Date/Time   COLORURINE YELLOW 02/12/2020 [place] 02/12/2020 1423   LABSPEC 1.014 02/12/2020 1423   PHURINE 6.0 02/12/2020 1423   GLUCOSEU NEGATIVE 02/12/2020 1423   HGBUR NEGATIVE 02/12/2020 1423   HGBUR negative 03/27/2010 [place] 02/12/2020 1423   BILIRUBINUR neg. 06/18/2014 [place] 02/12/2020 1423   PROTEINUR NEGATIVE 02/12/2020 1423   UROBILINOGEN 0.2 06/18/2014 1155   UROBILINOGEN 0.2 09/04/2012 1135   NITRITE NEGATIVE 02/12/2020 1423   LEUKOCYTESUR SMALL (A) 02/12/2020  1423   Sepsis Labs: @LABRCNTIP (procalcitonin:4,lacticidven:4)  ) Recent Results (from the past 240 hour(s))  Urine culture     Status: Abnormal   Collection Time: 02/10/20  8:50 PM   Specimen: Urine  Result Value Ref Range Status   Specimen Description Urine  Final   Special Requests   Final    NONE Performed at [place] [facility], [place] [address], [place], [place] [address]    Culture >=100,000 COLONIES/mL ESCHERICHIA COLI (A)  Final   Report Status 02/13/2020 FINAL  Final   Organism ID, Bacteria ESCHERICHIA COLI (A)  Final      Susceptibility   Escherichia coli - MIC*    AMPICILLIN 8 SENSITIVE Sensitive     CEFAZOLIN <=4 SENSITIVE Sensitive     CEFEPIME <=0.12 SENSITIVE Sensitive     CEFTAZIDIME <=1 SENSITIVE Sensitive     CEFTRIAXONE <=0.25 SENSITIVE Sensitive     CIPROFLOXACIN <=0.25 SENSITIVE Sensitive     GENTAMICIN <=1 SENSITIVE Sensitive     IMIPENEM <=0.25 SENSITIVE Sensitive     TRIMETH/SULFA <=20 SENSITIVE Sensitive     AMPICILLIN/SULBACTAM 4 SENSITIVE Sensitive     PIP/TAZO <=4 SENSITIVE Sensitive     * >=100,000 COLONIES/mL ESCHERICHIA COLI  SARS Coronavirus 2 by RT PCR (hospital order, performed in [place] hospital lab) Nasopharyngeal Nasopharyngeal Swab     Status: None   Collection Time: 02/12/20  6:44 PM   Specimen: Nasopharyngeal Swab  Result Value Ref Range Status   SARS Coronavirus 2 NEGATIVE NEGATIVE Final    Comment: (NOTE) SARS-CoV-2 target nucleic acids are NOT DETECTED.  The SARS-CoV-2 RNA is generally detectable in upper and lower respiratory specimens during the acute phase of infection. The lowest concentration of SARS-CoV-2 viral copies this assay can detect is 250 copies / mL. A negative result does not preclude SARS-CoV-2 infection and should not be used as the sole basis for treatment or other patient management decisions.  A negative result may occur with improper specimen collection / handling, submission of specimen other than  nasopharyngeal swab, presence of viral mutation(s) within the areas targeted by this assay, and inadequate number of viral copies (<250 copies / mL). A negative result must be combined with clinical observations, patient history, and epidemiological information.  Fact Sheet for Patients:   StrictlyIdeas.no  Fact Sheet for Healthcare Providers: BankingDealers.co.za  This test is not yet approved or  cleared by the [place] FDA and has been authorized for detection and/or diagnosis of SARS-CoV-2 by FDA under an Emergency Use Authorization (EUA).  This EUA will remain in effect (meaning this test can be used) for the duration of the COVID-19 declaration under Section 564(b)(1) of the Act, 21  U.S.C. section 360bbb-3(b)(1), unless the authorization is terminated or revoked sooner.  Performed at [facility], [place] [address], [place], [place] [address]       Studies: No results found.  Scheduled Meds: . ALPRAZolam  0.5 mg Oral BID  . amLODipine  10 mg Oral Daily  . aspirin  325 mg Oral Daily  . docusate sodium  200 mg Oral QHS  . enoxaparin (LOVENOX) injection  40 mg Subcutaneous Q24H  . ferrous sulfate  325 mg Oral BID  . fosfomycin  3 g Oral Q3 days  . gemfibrozil  600 mg Oral Daily  . hydrALAZINE  100 mg Oral TID  . hydrocortisone cream   Topical BID  . insulin aspart  0-9 Units Subcutaneous TID WC  . mirtazapine  7.5 mg Oral QHS  . olopatadine  1 drop Both Eyes Daily  . sertraline  50 mg Oral Daily  . sodium chloride flush  3 mL Intravenous Q12H  . trandolapril  4 mg Oral Daily  . vitamin B-12  1,000 mcg Oral Daily    Continuous Infusions: . lactated ringers 50 mL/hr at 02/14/20 0713     LOS: 1 day     Kayleen Memos, MD [facility] Pager [phone]  If 7PM-7AM, please contact night-coverage [email or web address] Password [facility] 02/14/2020, 12:06 PM

## 2020-02-14 NOTE — [place] FL2 (Signed)
[place] MEDICAID FL2 LEVEL OF CARE SCREENING TOOL     IDENTIFICATION  Patient Name: Renee [name] Birthdate: May 30, 1929 Sex: female Admission Date (Current Location): 02/12/2020  [facility] and [place] Number:  Herbalist and Address:  [facility], [place] [address] [place], [place] [address]      Provider Number: [id number]  Attending Physician Name and Address:  Kayleen Memos, DO  Relative Name and Phone Number:  Rollene Fare    Current Level of Care: Hospital Recommended Level of Care: [place] Prior Approval Number:    Date Approved/Denied:   PASRR Number: [id number]  Discharge Plan: SNF    Current Diagnoses: Patient Active Problem List   Diagnosis Date Noted  . Dizziness 02/12/2020  . Hypertensive urgency 02/12/2020  . Normocytic anemia 02/12/2020  . Fall involving sidewalk curb 11/29/2019  . Contusion of back 11/27/2019  . Hyperlipidemia associated with type 2 diabetes mellitus ([place])   . Aortic atherosclerosis ([place]) 07/13/2019  . H/O sepsis 07/01/2019  . CKD (chronic kidney disease) stage 4, GFR 15-29 ml/min (HCC) 07/01/2019  . Lower abdominal pain 05/11/2019  . Routine general medical examination at a health care facility 03/30/2019  . External hemorrhoid 01/17/2018  . History of colitis 01/17/2018  . Generalized weakness 12/24/2016  . Diabetic retinopathy ([place]) 12/11/2016  . History of CVA (cerebrovascular accident) 12/11/2016  . Estrogen deficiency 08/30/2015  . Encounter for Medicare annual wellness exam 04/17/2013  . Chest wall pain 04/07/2013  . Mobility impaired 06/26/2011  . History of retinal detachment 01/10/2011  . Sleep apnea 11/28/2010  . Anxiety and depression 08/25/2010  . Hemiplegia, late effect of cerebrovascular disease ([place]) 07/05/2010  . POSTHERPETIC NEURALGIA 11/09/2009  . Renal insufficiency 06/29/2008  . Chronic back pain 01/26/2008  . EDEMA 01/26/2008  . B12 deficiency 01/10/2007  .  Type 2 diabetes, controlled, with retinopathy ([place]) 11/27/2006  . Essential hypertension 11/27/2006  . FIBROCYSTIC BREAST DISEASE 11/27/2006  . ROSACEA 11/27/2006  . OSTEOARTHRITIS 11/27/2006  . URINARY INCONTINENCE, MIXED 11/27/2006    Orientation RESPIRATION BLADDER Height & Weight     Self, Place, Situation, Time  Normal Continent, External catheter Weight: 185 lb 10 oz (84.2 kg) Height:  5\' 2"  (157.5 cm)  BEHAVIORAL SYMPTOMS/MOOD NEUROLOGICAL BOWEL NUTRITION STATUS      Continent Diet (See discharge summary)  AMBULATORY STATUS COMMUNICATION OF NEEDS Skin   Extensive Assist Verbally Normal                       Personal Care Assistance Level of Assistance  Bathing, Feeding, Dressing Bathing Assistance: Limited assistance Feeding assistance: Limited assistance Dressing Assistance: Limited assistance     Functional Limitations Info  Sight, Hearing, Speech   Hearing Info: Impaired (Hearing Aids) Speech Info: Adequate    SPECIAL CARE FACTORS FREQUENCY  PT (By licensed PT), OT (By licensed OT)     PT Frequency: 5x a week OT Frequency: 5x a week            Contractures Contractures Info: Not present    Additional Factors Info  Allergies, Code Status Code Status Info: DNR Allergies Info: Diclofenac Sodium; Pioglitazone           Current Medications (02/14/2020):  This is the current hospital active medication list Current Facility-Administered Medications  Medication Dose Route Frequency Provider Last Rate Last Admin  . acetaminophen (TYLENOL) tablet 650 mg  650 mg Oral Q6H PRN Norval Morton, MD  Or  . acetaminophen (TYLENOL) suppository 650 mg  650 mg Rectal Q6H PRN Fuller Plan A, MD      . ALPRAZolam [name]) tablet 0.5 mg  0.5 mg Oral BID Fuller Plan A, MD   0.5 mg at 02/13/20 2223  . amLODipine (NORVASC) tablet 10 mg  10 mg Oral Daily Fuller Plan A, MD   10 mg at 02/13/20 0959  . aspirin tablet 325 mg  325 mg Oral Daily Fuller Plan  A, MD   325 mg at 02/13/20 0959  . docusate sodium (COLACE) capsule 200 mg  200 mg Oral QHS Smith, Rondell A, MD   200 mg at 02/13/20 2222  . enoxaparin (LOVENOX) injection 40 mg  40 mg Subcutaneous Q24H Smith, Rondell A, MD   40 mg at 02/13/20 1722  . ferrous sulfate tablet 325 mg  325 mg Oral BID Fuller Plan A, MD   325 mg at 02/13/20 2223  . fosfomycin (MONUROL) packet 3 g  3 g Oral Q3 days Kayleen Memos, DO   3 g at 02/13/20 6967  . gemfibrozil (LOPID) tablet 600 mg  600 mg Oral Daily Fuller Plan A, MD   600 mg at 02/13/20 0959  . hydrALAZINE (APRESOLINE) injection 10 mg  10 mg Intravenous Q4H PRN Fuller Plan A, MD   10 mg at 02/13/20 0649  . hydrALAZINE (APRESOLINE) tablet 100 mg  100 mg Oral TID Fuller Plan A, MD   100 mg at 02/13/20 2222  . hydrocortisone cream 1 %   Topical BID Norval Morton, MD   Given at 02/13/20 2221  . insulin aspart (novoLOG) injection 0-9 Units  0-9 Units Subcutaneous TID WC Norval Morton, MD   2 Units at 02/13/20 1218  . lactated ringers infusion   Intravenous Continuous Kayleen Memos, DO 50 mL/hr at 02/14/20 8938 New Bag at 02/14/20 0713  . mirtazapine (REMERON) tablet 7.5 mg  7.5 mg Oral QHS Smith, Rondell A, MD   7.5 mg at 02/13/20 2223  . olopatadine (PATANOL) 0.1 % ophthalmic solution 1 drop  1 drop Both Eyes Daily Fuller Plan A, MD   1 drop at 02/13/20 0958  . ondansetron (ZOFRAN) tablet 4 mg  4 mg Oral Q6H PRN Fuller Plan A, MD       Or  . ondansetron (ZOFRAN) injection 4 mg  4 mg Intravenous Q6H PRN Smith, Rondell A, MD      . sertraline (ZOLOFT) tablet 50 mg  50 mg Oral Daily Tamala Julian, Rondell A, MD   50 mg at 02/13/20 0959  . sodium chloride flush (NS) 0.9 % injection 3 mL  3 mL Intravenous Q12H Smith, Rondell A, MD   3 mL at 02/13/20 0959  . trandolapril (MAVIK) tablet 4 mg  4 mg Oral Daily Fuller Plan A, MD   4 mg at 02/13/20 0958  . vitamin B-12 (CYANOCOBALAMIN) tablet 1,000 mcg  1,000 mcg Oral Daily Fuller Plan A, MD    1,000 mcg at 02/13/20 1017     Discharge Medications: Please see discharge summary for a list of discharge medications.  Relevant Imaging Results:  Relevant Lab Results:   Additional Information SSN [id number]  Neysa Hotter [place], [place]

## 2020-02-14 NOTE — TOC Initial Note (Addendum)
Transition of Care [facility]) - Initial/Assessment Note    Patient Details  Name: Renee [name] MRN: [id number] Date of Birth: 07/06/1929  [facility] CM/SW Contact:    Arvella Merles, [place] Phone Number: 02/14/2020, 8:22 AM  Clinical Narrative:                 CSW met with patient and sister-in-law Regina bedside to discuss PT recommendation for short-term SNF placement. Patient provided permission for Rollene Fare to be involved in the conversation. PTA, paitent lived alone and used a rolling walker. She also had an aide who came to the home a few hours per day. Rollene Fare expressed patient does not have 24 hour support at home and both her and patient are in agreement with SNF placement at discharge. Patient expressed she has previously been to [facility]. Patient provided permission for referrals to be faxed in [facility] and understands the SNF will need to be in network with insurance. Patient is vaccinated. No further questions expressed at this time.   Expected Discharge Plan: Skilled Nursing Facility Barriers to Discharge: Continued Medical Work up, Ship broker   Patient Goals and CMS Choice   CMS Medicare.gov Compare Post Acute Care list provided to:: Patient Choice offered to / list presented to : Patient  Expected Discharge Plan and Services Expected Discharge Plan: [place] In-house Referral: Clinical Social Work     Living arrangements for the past 2 months: Single Family Home                                      Prior Living Arrangements/Services Living arrangements for the past 2 months: Single Family Home Lives with:: Self Patient language and need for interpreter reviewed:: Yes Do you feel safe going back to the place where you live?: Yes      Need for Family Participation in Patient Care: No (Comment) Care giver support system in place?: Yes (comment)   Criminal Activity/Legal Involvement Pertinent to Current  Situation/Hospitalization: No - Comment as needed  Activities of Daily Living Home Assistive Devices/Equipment: Gilford Rile (specify type) ADL Screening (condition at time of admission) Patient's cognitive ability adequate to safely complete daily activities?: Yes Is the patient deaf or have difficulty hearing?: No Does the patient have difficulty seeing, even when wearing glasses/contacts?: No Does the patient have difficulty concentrating, remembering, or making decisions?: No Patient able to express need for assistance with ADLs?: Yes Does the patient have difficulty dressing or bathing?: Yes Independently performs ADLs?: No Communication: Independent Dressing (OT): Needs assistance Is this a change from baseline?: Pre-admission baseline Grooming: Needs assistance Is this a change from baseline?: Pre-admission baseline Feeding: Independent Bathing: Needs assistance Is this a change from baseline?: Pre-admission baseline Toileting: Needs assistance Is this a change from baseline?: Pre-admission baseline In/Out Bed: Needs assistance Is this a change from baseline?: Pre-admission baseline Walks in Home: Needs assistance Is this a change from baseline?: Pre-admission baseline Does the patient have difficulty walking or climbing stairs?: Yes Weakness of Legs: Both Weakness of Arms/Hands: None  Permission Sought/Granted Permission sought to share information with : Facility Sport and exercise psychologist, Family Supports Permission granted to share information with : Yes, Verbal Permission Granted  Share Information with NAME: Rollene Fare  Permission granted to share info w AGENCY: SNFs  Permission granted to share info w Relationship: Sister-in-law  Permission granted to share info w Contact Information: [phone]  Emotional Assessment   Attitude/Demeanor/Rapport: Unable to Assess Affect (typically observed): Unable to Assess Orientation: : Oriented to Place, Oriented to Self, Oriented to   Time, Oriented to Situation Alcohol / Substance Use: Not Applicable Psych Involvement: No (comment)  Admission diagnosis:  Dizziness [R42] Patient Active Problem List   Diagnosis Date Noted  . Dizziness 02/12/2020  . Hypertensive urgency 02/12/2020  . Normocytic anemia 02/12/2020  . Fall involving sidewalk curb 11/29/2019  . Contusion of back 11/27/2019  . Hyperlipidemia associated with type 2 diabetes mellitus ([place])   . Aortic atherosclerosis ([place]) 07/13/2019  . H/O sepsis 07/01/2019  . CKD (chronic kidney disease) stage 4, GFR 15-29 ml/min (HCC) 07/01/2019  . Lower abdominal pain 05/11/2019  . Routine general medical examination at a health care facility 03/30/2019  . External hemorrhoid 01/17/2018  . History of colitis 01/17/2018  . Generalized weakness 12/24/2016  . Diabetic retinopathy ([place]) 12/11/2016  . History of CVA (cerebrovascular accident) 12/11/2016  . Estrogen deficiency 08/30/2015  . Encounter for Medicare annual wellness exam 04/17/2013  . Chest wall pain 04/07/2013  . Mobility impaired 06/26/2011  . History of retinal detachment 01/10/2011  . Sleep apnea 11/28/2010  . Anxiety and depression 08/25/2010  . Hemiplegia, late effect of cerebrovascular disease ([place]) 07/05/2010  . POSTHERPETIC NEURALGIA 11/09/2009  . Renal insufficiency 06/29/2008  . Chronic back pain 01/26/2008  . EDEMA 01/26/2008  . B12 deficiency 01/10/2007  . Type 2 diabetes, controlled, with retinopathy ([place]) 11/27/2006  . Essential hypertension 11/27/2006  . FIBROCYSTIC BREAST DISEASE 11/27/2006  . ROSACEA 11/27/2006  . OSTEOARTHRITIS 11/27/2006  . URINARY INCONTINENCE, MIXED 11/27/2006   PCP:  Abner Greenspan, MD Pharmacy:   [id number] [place] [id number]: [phone]: [phone]    Social Determinants of Health (SDOH) Interventions    Readmission Risk Interventions No  flowsheet data found.

## 2020-02-15 LAB — GLUCOSE, CAPILLARY
Glucose-Capillary: 132 mg/dL — ABNORMAL HIGH (ref 70–99)
Glucose-Capillary: 159 mg/dL — ABNORMAL HIGH (ref 70–99)
Glucose-Capillary: 147 mg/dL — ABNORMAL HIGH (ref 70–99)

## 2020-02-15 MED ORDER — CALCIUM CARBONATE-VITAMIN D 500-200 MG-UNIT PO TABS
1.0000 | ORAL_TABLET | Freq: Every day | ORAL | Status: DC
  Administered 2020-02-15 – 2020-02-22 (×8): 1 via ORAL
  Filled 2020-02-15 (×8): qty 1

## 2020-02-15 MED ORDER — FOSFOMYCIN TROMETHAMINE 3 G PO PACK
3.0000 g | PACK | Freq: Once | ORAL | Status: AC
  Administered 2020-02-15: 3 g via ORAL
  Filled 2020-02-15: qty 3

## 2020-02-15 MED ORDER — ALPRAZOLAM 0.5 MG PO TABS: ORAL_TABLET | ORAL | 0 refills | Status: DC

## 2020-02-15 MED ORDER — ADULT MULTIVITAMIN W/MINERALS CH
1.0000 | ORAL_TABLET | Freq: Every day | ORAL | Status: DC
  Administered 2020-02-15 – 2020-02-22 (×8): 1 via ORAL
  Filled 2020-02-15 (×8): qty 1

## 2020-02-15 NOTE — Plan of Care (Signed)
  Problem: Education: Goal: Knowledge of General Education information will improve Description: Including pain rating scale, medication(s)/side effects and non-pharmacologic comfort measures Outcome: Completed/Met   Problem: Health Behavior/Discharge Planning: Goal: Ability to manage health-related needs will improve Outcome: Completed/Met   Problem: Clinical Measurements: Goal: Ability to maintain clinical measurements within normal limits will improve Outcome: Completed/Met Goal: Will remain free from infection Outcome: Completed/Met Goal: Diagnostic test results will improve Outcome: Completed/Met Goal: Respiratory complications will improve Outcome: Completed/Met Goal: Cardiovascular complication will be avoided Outcome: Completed/Met   Problem: Coping: Goal: Level of anxiety will decrease Outcome: Completed/Met   Problem: Elimination: Goal: Will not experience complications related to bowel motility Outcome: Completed/Met Goal: Will not experience complications related to urinary retention Outcome: Completed/Met   Problem: Pain Managment: Goal: General experience of comfort will improve Outcome: Completed/Met

## 2020-02-15 NOTE — Progress Notes (Signed)
Occupational Therapy Evaluation Patient Details Name: Renee [name] MRN: [id number] DOB: 1929/08/02 Today's Date: 02/15/2020    History of Present Illness Renee [name] is a 86 y.o. female with medical history significant of CVA with residual left-sided weakness, diabetes mellitus type 2, vertigo, hypertension, hyperlipidemia, CKD stage 3, and recurrent UTIs.  Admitted for dizziness with continued work-up.    Clinical Impression   PTA, pt lives at home alone and receives Min A for ADLs from [facility] aide daily. Pt reports ability to ambulate in home with RW at baseline. Pt presents today with improvements in dizziness, but continued deficits in strength and standing balance impacting safety with ADLs. Pt overall min guard to sit EOB, Mod A for all sit to stand transfers with consistent cues needed for safe hand placement. Pt overall Mod A for stand pivot transfers with RW. Pt initially reported desire to ambulate to bathroom, but limited by weakness at this time and guided in [facility] transfer instead. Pt overall Max A for peri care due to decreased standing balance. Recommend SNF for short term rehab prior to discharge home. Will continue to follow acutely.     Follow Up Recommendations  SNF    Equipment Recommendations  3 in 1 bedside commode    Recommendations for Other Services       Precautions / Restrictions Precautions Precautions: Fall Restrictions Weight Bearing Restrictions: No      Mobility Bed Mobility Overal bed mobility: Needs Assistance Bed Mobility: Supine to Sit     Supine to sit: Min guard;HOB elevated     General bed mobility comments: min guard for safety due to hx of dizziness. Mild dizziness reported while sitting EOB that subsided within 15 seconds  Transfers Overall transfer level: Needs assistance Equipment used: Rolling walker (2 wheeled) Transfers: Sit to/from [facility] Sit to Stand: Mod assist Stand pivot transfers: Mod assist        General transfer comment: Mod A for sit to stand transfers with RW with cues needed for safe hand placement and posturing. Pt Mod A for SPT bed to [facility] to recliner chair with consistent cues needed to bring RW with her prior to sitting down    Balance Overall balance assessment: Needs assistance Sitting-balance support: Bilateral upper extremity supported;Feet supported Sitting balance-Leahy Scale: Fair     Standing balance support: Bilateral upper extremity supported Standing balance-Leahy Scale: Poor Standing balance comment: Pt requires Min A to maintain standing balance, unable to remove B hands from RW to complete LB ADL tasks safely                            ADL either performed or assessed with clinical judgement   ADL Overall ADL's : Needs assistance/impaired Eating/Feeding: Set up;Sitting   Grooming: Set up;Sitting   Upper Body Bathing: Minimal assistance;Sitting   Lower Body Bathing: Maximal assistance;Sit to/from stand   Upper Body Dressing : Minimal assistance;Sitting   Lower Body Dressing: Maximal assistance;Sit to/from stand   Toilet Transfer: Stand-pivot;BSC;RW;Moderate Print production planner Details (indicate cue type and reason): Pt overall Mod A for SPT bed > BSC with RW. Pt initially wanted to attempt ambulating to bathroom but too weak today. Cues needed for RW mgmt and hand placement throughout Toileting- Clothing Manipulation and Hygiene: Maximal assistance;Sit to/from stand Toileting - Clothing Manipulation Details (indicate cue type and reason): max A for peri care in standing with RW due to pt need for B UE  support on RW to maintain steadiness     Functional mobility during ADLs: Moderate assistance;Rolling walker General ADL Comments: Pt with decreased standing balance and strength impacting ability to complete ADL tasks in standing due to reliance on B UE support to RW     Vision Baseline Vision/History: Wears glasses Wears Glasses:  At all times Patient Visual Report: No change from baseline Vision Assessment?: No apparent visual deficits     Perception     Praxis      Pertinent Vitals/Pain Pain Assessment: No/denies pain     Hand Dominance Right   Extremity/Trunk Assessment Upper Extremity Assessment Upper Extremity Assessment: Generalized weakness   Lower Extremity Assessment Lower Extremity Assessment: Defer to PT evaluation   Cervical / Trunk Assessment Cervical / Trunk Assessment: Kyphotic   Communication Communication Communication: HOH   Cognition Arousal/Alertness: Awake/alert Behavior During Therapy: Flat affect Overall Cognitive Status: Within Functional Limits for tasks assessed                                     General Comments  Pt reporting mild dizziness sitting EOB that passed within 15 seconds. Pt denied any dizziness for the remainder of mobility during session    Exercises     Shoulder Instructions      Home Living Family/patient expects to be discharged to:: Private residence Living Arrangements: Alone Available Help at Discharge: Family;Available PRN/intermittently;Personal care attendant Type of Home: House Home Access: Ramped entrance     Home Layout: One level     Bathroom Shower/Tub: Occupational psychologist: Standard Bathroom Accessibility: Yes   Home Equipment: Environmental consultant - 2 wheels   Additional Comments: lives alone with [place] aide x6 hours daily.       Prior Functioning/Environment Level of Independence: Needs assistance  Gait / Transfers Assistance Needed: use of RW ADL's / Homemaking Assistance Needed: Caregiver assist lightly with ADLs   Comments: caregiver assists 9 am until 3 pm 6 days per weeks        OT Problem List: Decreased strength;Decreased activity tolerance;Impaired balance (sitting and/or standing);Decreased coordination;Decreased safety awareness;Decreased knowledge of use of DME or AE      OT  Treatment/Interventions: Self-care/ADL training;Therapeutic exercise;Energy conservation;DME and/or AE instruction;Therapeutic activities;Patient/family education    OT Goals(Current goals can be found in the care plan section) Acute Rehab OT Goals Patient Stated Goal: go home OT Goal Formulation: With patient Time For Goal Achievement: 02/29/20 Potential to Achieve Goals: Good ADL Goals Pt Will Perform Grooming: with modified independence;standing Pt Will Perform Upper Body Dressing: with min guard assist;sitting Pt Will Perform Lower Body Dressing: with min assist;sit to/from stand Pt Will Transfer to Toilet: with min guard assist;ambulating;bedside commode Pt Will Perform Toileting - Clothing Manipulation and hygiene: with min guard assist;sit to/from stand  OT Frequency: Min 2X/week   Barriers to D/C:            Co-evaluation              AM-PAC OT "6 Clicks" Daily Activity     Outcome Measure Help from another person eating meals?: A Little Help from another person taking care of personal grooming?: A Little Help from another person toileting, which includes using toliet, bedpan, or urinal?: A Lot Help from another person bathing (including washing, rinsing, drying)?: A Lot Help from another person to put on and taking off regular upper body clothing?: A Little  Help from another person to put on and taking off regular lower body clothing?: A Lot 6 Click Score: 15   End of Session Equipment Utilized During Treatment: Gait belt;Rolling walker Nurse Communication: Mobility status  Activity Tolerance: Patient tolerated treatment well Patient left: in chair;with call bell/phone within reach  OT Visit Diagnosis: Unsteadiness on feet (R26.81);Other abnormalities of gait and mobility (R26.89);Muscle weakness (generalized) (M62.81);History of falling (Z91.81)                Time: 5462-7035 OT Time Calculation (min): 20 min Charges:  OT General Charges $OT Visit: 1  Visit OT Evaluation $OT Eval Moderate Complexity: 1 Mod  Layla Maw, OTR/L  Layla Maw 02/15/2020, 9:31 AM

## 2020-02-15 NOTE — Progress Notes (Signed)
Physical Therapy Treatment Patient Details Name: Renee [name] MRN: [id number] DOB: 11/11/1928 Today's Date: 02/15/2020    History of Present Illness Renee [name] is a 86 y.o. female with medical history significant of CVA with residual left-sided weakness, diabetes mellitus type 2, vertigo, hypertension, hyperlipidemia, CKD stage 3, and recurrent UTIs.  Admitted for dizziness with continued work-up.     PT Comments    Continuing work on functional mobility and activity tolerance;  Notable incr amb distance today with RW; still with generalized weakness and incr fall risk; continue to recommend SNF for post acute rehab   Follow Up Recommendations  SNF;Supervision/Assistance - 24 hour     Equipment Recommendations  None recommended by PT    Recommendations for Other Services       Precautions / Restrictions Precautions Precautions: Fall    Mobility  Bed Mobility Overal bed mobility: Needs Assistance Bed Mobility: Sit to Supine       Sit to supine: Min assist   General bed mobility comments: Min assist to help LEs into bed  Transfers Overall transfer level: Needs assistance Equipment used: Rolling walker (2 wheeled) Transfers: Sit to/from Stand Sit to Stand: Mod assist         General transfer comment: light mod assist to power up to stand from recliner and 3in1  Ambulation/Gait Ambulation/Gait assistance: Min assist Gait Distance (Feet): 30 Feet (20+10) Assistive device: Rolling walker (2 wheeled) Gait Pattern/deviations: Step-through pattern;Decreased step length - right;Decreased step length - left;Trunk flexed     General Gait Details: Very motivated to get to bathroom; Notably tremulous and weak LEs, requiring very close and vigilant guard   Stairs             Wheelchair Mobility    Modified Rankin (Stroke Patients Only)       Balance     Sitting balance-Leahy Scale: Fair       Standing balance-Leahy Scale: Poor                               Cognition Arousal/Alertness: Awake/alert Behavior During Therapy: Flat affect Overall Cognitive Status: Within Functional Limits for tasks assessed                                        Exercises      General Comments General comments (skin integrity, edema, etc.): No reports of dizziness this session      Pertinent Vitals/Pain Pain Assessment: No/denies pain    Home Living                      Prior Function            PT Goals (current goals can now be found in the care plan section) Acute Rehab PT Goals Patient Stated Goal: to get to the bathroom PT Goal Formulation: With patient Time For Goal Achievement: 02/27/20 Potential to Achieve Goals: Good Progress towards PT goals: Progressing toward goals    Frequency    Min 2X/week      PT Plan Current plan remains appropriate    Co-evaluation              AM-PAC PT "6 Clicks" Mobility   Outcome Measure  Help needed turning from your back to your side while in a flat bed without using bedrails?:  A Lot Help needed moving from lying on your back to sitting on the side of a flat bed without using bedrails?: A Lot Help needed moving to and from a bed to a chair (including a wheelchair)?: A Lot Help needed standing up from a chair using your arms (e.g., wheelchair or bedside chair)?: A Lot Help needed to walk in hospital room?: A Lot Help needed climbing 3-5 steps with a railing? : Total 6 Click Score: 11    End of Session Equipment Utilized During Treatment: Gait belt Activity Tolerance: Patient tolerated treatment well;Patient limited by fatigue Patient left: in bed;with call bell/phone within reach Nurse Communication: Mobility status PT Visit Diagnosis: Unsteadiness on feet (R26.81);Other abnormalities of gait and mobility (R26.89);Muscle weakness (generalized) (M62.81)     Time: 0175-1025 PT Time Calculation (min) (ACUTE ONLY): 28 min  Charges:   $Gait Training: 8-22 mins $Therapeutic Activity: 8-22 mins                     Roney Marion, PT  Acute Rehabilitation Services Pager [phone] Office [place] 02/15/2020, 6:47 PM

## 2020-02-15 NOTE — Plan of Care (Signed)
  Problem: Activity: Goal: Risk for activity intolerance will decrease Outcome: Progressing   Problem: Safety: Goal: Ability to remain free from injury will improve Outcome: Progressing   

## 2020-02-15 NOTE — Discharge Summary (Signed)
Discharge Summary  Renee [name] GUR:[id number] DOB: Jul 19, 1929  PCP: Abner Greenspan, MD  Admit date: 02/12/2020 Discharge date: 02/15/2020  Time spent: 35 minutes  Recommendations for Outpatient Follow-up:  1. Follow-up with your PCP 2. Take your medications as prescribed 3. Continue PT OT with assistance and fall precautions  Discharge Diagnoses:  Active Hospital Problems   Diagnosis Date Noted  . Dizziness 02/12/2020  . Hypertensive urgency 02/12/2020  . Normocytic anemia 02/12/2020  . Anxiety and depression 08/25/2010  . Hemiplegia, late effect of cerebrovascular disease ([place]) 07/05/2010  . Type 2 diabetes, controlled, with retinopathy ([place]) 11/27/2006    Resolved Hospital Problems  No resolved problems to display.    Discharge Condition: Stable  Diet recommendation: Heart healthy carb modified diet  Vitals:   02/15/20 0445 02/15/20 1005  BP: (!) 177/80 (!) 151/65  Pulse: (!) 59 63  Resp: 18 18  Temp: 97.6 F (36.4 C) 97.8 F (36.6 C)  SpO2: 95% 95%    History of present illness:  EGB:TDVVO A Renee [name] a 86 y.o.femalewith medical history significant ofCVA with residual left-sided weakness,diabetes mellitus type 2,vertigo, hypertension, hyperlipidemia,CKD stage 3,and recurrent UTIs.At baseline she ambulates with the use of a walker. She presents with complaints of dizziness over the last 3 days. She reports feeling as though the room is spinning around her and as if she may pass out. Symptoms only present when changing positions and resolved when laying still. Notes associated symptoms of nausea when symptoms are present. Denies having any vomiting, fever, loss of consciousness, change in appetite, or diarrhea.  Patient had been seen in the emergency department 2 days ago forthesame symptoms. CT scan revealed chronic right basal ganglia infarct without acute abnormalities. She was diagnosed with a urinary tract infection and given one-time dose of  fosfomycin.She denies any complaints urinary frequency or dysuria symptoms since receiving treatment.  ED Course:Upon admission into the emergency department was pulse 52-59, blood pressures elevated up to 184/76, and all other vital signs within normal limits. Labs significant for low 11.1, BUN 15, andcreatinine 1.29.MRI of the brain was obtained and official read pending. Patient had been given 500 mL of normal saline IV fluids, 25 mg of meclizine, and Tylenol.   02/15/20: Seen and examined at her bedside.    No acute events overnight.  States she feels better today.  Denies dizziness, nausea, palpitation, chest pain, dyspnea.  PT has assessed and recommended SNF.  TOC assisting with SNF placement.     Hospital Course:  Principal Problem:   Dizziness Active Problems:   Type 2 diabetes, controlled, with retinopathy (HCC)   Hemiplegia, late effect of cerebrovascular disease (HCC)   Anxiety and depression   Hypertensive urgency   Normocytic anemia  Resolved dizziness with history of CVA and vertigo likely exacerbated by E. coli UTI:  Presented with complaints of dizziness with sensation of room spinning around her and feeling like she may pass out.   History of CVA and vertigo, relays no prior vestibular treatment and no prior antiemetics or vertigo medications.  CT head and MRI brain negative for any acute intracranial findings.  Assessed by PT with recommendation for SNF Continue PT OT with assistance and fall precautions TOC assisting with SNF placement  E. coli UTI, treated Urine culture taken on 02/10/2020 grew greater than 100,000 colonies of E. coli, pansensitive. Treated with fosfomycin 1 dose every 3 days x 3 doses, previous doses were 02/10/2020 and 02/13/2020.  Last dose on 02/15/2020.  Resolved dehydration  and poor oral intake due to nausea Good oral intake Denies nausea  Resolved bradycardia, likely iatrogenic:  Presented with heart rates in the 50s.  Resume  home metoprolol  Uncontrolled hypertension: Presented with SBP greater than 180  Continue home antihypertensives, amlodipine 10 mg daily, hydralazine 100 mg 3 times daily Continue home metoprolol Follow-up with your PCP  Chronic kidney disease stage IIIA:  Baseline creatinine appears to be 1.1 with GFR of 41.   She is currently at her baseline renal function.   Continue to avoid nephrotoxins  Follow-up with PCP  History of CVA with residual deficits:  Patient with residual left-sided weakness unchanged. PT assessment recommendation for SNF Continue PT OT with assistance and fall precautions  Diabetes mellitus type 2, well controlled:  Last hemoglobin A1c noted to be 6.1 on 01/05/2020.  Follow-up with your PCP  Chronic normocytic anemia:  Presented with hemoglobin 11.1 and MCV 93 No overt bleeding Follow-up with your PCP  Dyslipidemia: Continue Home medications includegemfibrozil 600 mg daily  Anxiety and depression -Continue Xanax and Zoloft -Follow-up with your PCP  CODE STATUS: DNR   Consults called:None   Discharge Exam: BP (!) 151/65 (BP Location: Right Arm)   Pulse 63   Temp 97.8 F (36.6 C) (Oral)   Resp 18   Ht 5' 2"  (1.575 m)   Wt 84 kg   SpO2 95%   BMI 33.87 kg/m  . General: 86 y.o. year-old female well developed well nourished in no acute distress.  Alert and interactive.  Hard of hearing. . Cardiovascular: Regular rate and rhythm with no rubs or gallops.  No thyromegaly or JVD noted.   Marland Kitchen Respiratory: Clear to auscultation with no wheezes or rales. Good inspiratory effort. . Abdomen: Soft nontender nondistended with normal bowel sounds x4 quadrants. . Musculoskeletal: No lower extremity edema. 2/4 pulses in all 4 extremities. Marland Kitchen Psychiatry: Mood is appropriate for condition and setting  Discharge Instructions You were cared for by a hospitalist during your hospital stay. If you have any questions about your discharge medications or  the care you received while you were in the hospital after you are discharged, you can call the unit and asked to speak with the hospitalist on call if the hospitalist that took care of you is not available. Once you are discharged, your primary care physician will handle any further medical issues. Please note that NO REFILLS for any discharge medications will be authorized once you are discharged, as it is imperative that you return to your primary care physician (or establish a relationship with a primary care physician if you do not have one) for your aftercare needs so that they can reassess your need for medications and monitor your lab values.   Allergies as of 02/15/2020      Reactions   Diclofenac Sodium Other (See Comments)   REACTION: angioedema   Pioglitazone Swelling      Medication List    STOP taking these medications   REFRESH OP   valACYclovir 500 MG tablet Commonly known as: Valtrex     TAKE these medications   acetaminophen 325 MG tablet Commonly known as: TYLENOL Take 650 mg by mouth every 6 (six) hours as needed.   albuterol 108 (90 Base) MCG/ACT inhaler Commonly known as: VENTOLIN HFA Inhale 2 puffs into the lungs every 4 (four) hours as needed for wheezing or shortness of breath.   ALPRAZolam 0.5 MG tablet Commonly known as: XANAX TAKE 1/2 TO 1 TABLET BY MOUTH TWICE  A DAY AS NEEDED FOR ANXIETY What changed: See the new instructions.   amLODipine 10 MG tablet Commonly known as: NORVASC Take 1 tablet (10 mg total) by mouth daily.   aspirin 325 MG tablet Take 325 mg by mouth daily.   calcium-vitamin D 500-200 MG-UNIT tablet Commonly known as: OSCAL WITH D Take 1 tablet by mouth daily.   docusate sodium 100 MG capsule Commonly known as: COLACE Take 200 mg by mouth at bedtime.   ferrous sulfate 325 (65 FE) MG tablet Take 325 mg by mouth 2 (two) times daily.   gemfibrozil 600 MG tablet Commonly known as: LOPID TAKE 1 TABLET BY MOUTH EVERY DAY     glipiZIDE 2.5 MG 24 hr tablet Commonly known as: GLUCOTROL XL TAKE 1 TABLET BY MOUTH EVERY DAY WITH BREAKFAST What changed: See the new instructions.   hydrALAZINE 100 MG tablet Commonly known as: APRESOLINE Take 100 mg by mouth 3 (three) times daily.   metoprolol succinate 25 MG 24 hr tablet Commonly known as: TOPROL-XL Take 0.5 tablets (12.5 mg total) by mouth 2 (two) times daily.   mirtazapine 15 MG tablet Commonly known as: REMERON TAKE 1/2 TABLET BY MOUTH AT BEDTIME   multivitamin capsule Take 1 capsule by mouth daily.   ONE TOUCH ULTRA 2 w/Device Kit Check blood sugar once daily and as directed. Dx P54.6   OneTouch Delica Lancets 56C Misc CHECK BLOOD SUGAR ONCE DAILY AND AS DIRECTED. DX E11.9 What changed: See the new instructions.   OneTouch Ultra test strip Generic drug: glucose blood CHECK BLOOD SUGAR ONCE DAILY AND AS DIRECTED. DX E11.9   PATADAY OP Place 1 drop into both eyes daily.   sertraline 50 MG tablet Commonly known as: ZOLOFT TAKE 1 TABLET BY MOUTH EVERY DAY   trandolapril 4 MG tablet Commonly known as: MAVIK Take 1 tablet (4 mg total) by mouth daily.   vitamin B-12 1000 MCG tablet Commonly known as: CYANOCOBALAMIN Take 1,000 mcg by mouth daily.   Vitamin D 1000 units capsule Take 1,000 Units by mouth daily.            Durable Medical Equipment  (From admission, onward)         Start     Ordered   02/15/20 0957  For home use only DME 3 n 1  Once        02/15/20 0956         Allergies  Allergen Reactions  . Diclofenac Sodium Other (See Comments)    REACTION: angioedema  . Pioglitazone Swelling    Follow-up Information    Tower, Wynelle Fanny, MD. Call in 1 day(s).   Specialties: Family Medicine, Radiology Why: please call for a post hospital follow up appointment Contact information: [place] [address] [phone]                The results of significant diagnostics from this  hospitalization (including imaging, microbiology, ancillary and laboratory) are listed below for reference.    Significant Diagnostic Studies: CT Head Wo Contrast  Result Date: 02/10/2020 CLINICAL DATA:  Dizziness. EXAM: CT HEAD WITHOUT CONTRAST TECHNIQUE: Contiguous axial images were obtained from the base of the skull through the vertex without intravenous contrast. COMPARISON:  October 31, 2010 FINDINGS: Brain: There is mild to moderate severity cerebral atrophy with widening of the extra-axial spaces and ventricular dilatation. There are areas of decreased attenuation within the white matter tracts of the supratentorial brain, consistent with microvascular disease  changes. A chronic right basal ganglia lacunar infarct is seen. This is present on the prior study. Vascular: No hyperdense vessel or unexpected calcification. Skull: Normal. Negative for fracture or focal lesion. Sinuses/Orbits: No acute finding. Evidence of prior scleral banding is noted on the right. Other: None. IMPRESSION: 1. Generalized cerebral atrophy. 2. Chronic right basal ganglia lacunar infarct. 3. No acute intracranial abnormality. Electronically Signed   By: Virgina Norfolk M.D.   On: 02/10/2020 20:27   MR BRAIN WO CONTRAST  Result Date: 02/12/2020 CLINICAL DATA:  Dizziness. History of stroke with residual left hemiparesis. EXAM: MRI HEAD WITHOUT CONTRAST TECHNIQUE: Multiplanar, multiecho pulse sequences of the brain and surrounding structures were obtained without intravenous contrast. COMPARISON:  Head CT 02/10/2020 and MRI 06/02/2010 FINDINGS: Brain: No acute infarct, mass, midline shift, or extra-axial fluid collection is identified. A chronic infarct is again noted involving the right basal ganglia and corona radiata with associated chronic blood products and ex vacuo dilatation of the body of the right lateral ventricle. Small T2 hyperintensities elsewhere in the cerebral white matter bilaterally have mildly progressed from  the prior MRI and are nonspecific but compatible with mild chronic small vessel ischemic disease. There is mild cerebral atrophy. Vascular: Major intracranial vascular flow voids are preserved. Skull and upper cervical spine: Unremarkable bone marrow signal. Sinuses/Orbits: Bilateral cataract extraction. Right scleral buckle. Clear paranasal sinuses. Left mastoid and middle ear effusions, also present in 2011. Other: None. IMPRESSION: 1. No acute intracranial abnormality. 2. Mild chronic small vessel ischemic disease. 3. Chronic right basal ganglia and deep white matter infarct. 4. Chronic or recurrent left mastoid and middle ear effusions. Electronically Signed   By: Logan Bores M.D.   On: 02/12/2020 16:17    Microbiology: Recent Results (from the past 240 hour(s))  Urine culture     Status: Abnormal   Collection Time: 02/10/20  8:50 PM   Specimen: Urine  Result Value Ref Range Status   Specimen Description Urine  Final   Special Requests   Final    NONE Performed at [facility], [address], [place], [place] [address]    Culture >=100,000 COLONIES/mL ESCHERICHIA COLI (A)  Final   Report Status 02/13/2020 FINAL  Final   Organism ID, Bacteria ESCHERICHIA COLI (A)  Final      Susceptibility   Escherichia coli - MIC*    AMPICILLIN 8 SENSITIVE Sensitive     CEFAZOLIN <=4 SENSITIVE Sensitive     CEFEPIME <=0.12 SENSITIVE Sensitive     CEFTAZIDIME <=1 SENSITIVE Sensitive     CEFTRIAXONE <=0.25 SENSITIVE Sensitive     CIPROFLOXACIN <=0.25 SENSITIVE Sensitive     GENTAMICIN <=1 SENSITIVE Sensitive     IMIPENEM <=0.25 SENSITIVE Sensitive     TRIMETH/SULFA <=20 SENSITIVE Sensitive     AMPICILLIN/SULBACTAM 4 SENSITIVE Sensitive     PIP/TAZO <=4 SENSITIVE Sensitive     * >=100,000 COLONIES/mL ESCHERICHIA COLI  SARS Coronavirus 2 by RT PCR (hospital order, performed in [place] hospital lab) Nasopharyngeal Nasopharyngeal Swab     Status: None   Collection Time: 02/12/20  6:44 PM    Specimen: Nasopharyngeal Swab  Result Value Ref Range Status   SARS Coronavirus 2 NEGATIVE NEGATIVE Final    Comment: (NOTE) SARS-CoV-2 target nucleic acids are NOT DETECTED.  The SARS-CoV-2 RNA is generally detectable in upper and lower respiratory specimens during the acute phase of infection. The lowest concentration of SARS-CoV-2 viral copies this assay can detect is 250 copies / mL.  A negative result does not preclude SARS-CoV-2 infection and should not be used as the sole basis for treatment or other patient management decisions.  A negative result may occur with improper specimen collection / handling, submission of specimen other than nasopharyngeal swab, presence of viral mutation(s) within the areas targeted by this assay, and inadequate number of viral copies (<250 copies / mL). A negative result must be combined with clinical observations, patient history, and epidemiological information.  Fact Sheet for Patients:   StrictlyIdeas.no  Fact Sheet for Healthcare Providers: BankingDealers.co.za  This test is not yet approved or  cleared by the [place] FDA and has been authorized for detection and/or diagnosis of SARS-CoV-2 by FDA under an Emergency Use Authorization (EUA).  This EUA will remain in effect (meaning this test can be used) for the duration of the COVID-19 declaration under Section 564(b)(1) of the Act, 21 U.S.C. section 360bbb-3(b)(1), unless the authorization is terminated or revoked sooner.  Performed at [facility], [place] [address] [place], [place] [address]      Labs: Basic Metabolic Panel: Recent Labs  Lab 02/10/20 1123 02/12/20 1331 02/13/20 0304  NA 141 142 139  K 4.5 4.3 4.0  CL 105 106 104  CO2 25 26 25   GLUCOSE 135* 123* 111*  BUN 18 15 15   CREATININE 1.42* 1.29* 1.18*  CALCIUM 9.0 8.6* 8.4*   Liver Function Tests: No results for input(s): AST, ALT, ALKPHOS, BILITOT, PROT,  ALBUMIN in the last 168 hours. No results for input(s): LIPASE, AMYLASE in the last 168 hours. No results for input(s): AMMONIA in the last 168 hours. CBC: Recent Labs  Lab 02/10/20 1123 02/12/20 1331 02/13/20 0304  WBC 6.3 4.5 4.3  HGB 12.7 11.1* 10.8*  HCT 41.1 36.3 35.2*  MCV 94.1 93.8 92.4  PLT 187 152 161   Cardiac Enzymes: No results for input(s): CKTOTAL, CKMB, CKMBINDEX, TROPONINI in the last 168 hours. BNP: BNP (last 3 results) No results for input(s): BNP in the last 8760 hours.  ProBNP (last 3 results) No results for input(s): PROBNP in the last 8760 hours.  CBG: Recent Labs  Lab 02/14/20 1119 02/14/20 1626 02/14/20 2040 02/15/20 0647 02/15/20 1119  GLUCAP 197* 109* 201* 132* 159*       Signed:  Kayleen Memos, MD [facility] 02/15/2020, 12:17 PM

## 2020-02-15 NOTE — TOC Progression Note (Signed)
Transition of Care [facility]) - Progression Note    Patient Details  Name: Renee [name] MRN: [id number] Date of Birth: October 27, 1928  [facility] CM/SW Contact  Sharlet Salina Mila Homer, LCSW Phone Number: 02/15/2020, 5:24 PM  Clinical Narrative: Talked with patient (first visit) and then again with patient and her sister-in-law Rollene Fare regarding SNF for rehab. Patient agreeable and requested a facility closer to where she lives. She asked about [facility] and was advised that this facility is in [place] and she was in agreement, if they could take her. [place] and talked with Magda Paganini in admissions. She requested clinicals be sent and after reviewing them, CSW received a call back from [facility] indicating that she can accept patient and got permission to initiate insurance [place].  Talked later today with patient and her sister-in-law Rollene Fare regarding SNF placement and answered their questions. Also contacted Magda Paganini at [facility] and via speaker-phone the patient and sister-in-law talked with Rollene Fare and got their questions answered. Patient and family aware that we are awaiting insurance authorization.   Expected Discharge Plan: [place] Barriers to Discharge: Continued Medical Work up, Ship broker  Expected Discharge Plan and Services Expected Discharge Plan: [place] In-house Referral: Clinical Social Work     Living arrangements for the past 2 months: Single Family Home Expected Discharge Date: 02/15/20                                     Social Determinants of Health (SDOH) Interventions    Readmission Risk Interventions No flowsheet data found.

## 2020-02-15 NOTE — Discharge Instructions (Signed)
Dizziness Dizziness is a common problem. It makes you feel unsteady or light-headed. You may feel like you are about to pass out (faint). Dizziness can lead to getting hurt if you stumble or fall. Dizziness can be caused by many things, including:  Medicines.  Not having enough water in your body (dehydration).  Illness. Follow these instructions at home: Eating and drinking   Drink enough fluid to keep your pee (urine) clear or pale yellow. This helps to keep you from getting dehydrated. Try to drink more clear fluids, such as water.  Do not drink alcohol.  Limit how much caffeine you drink or eat, if your doctor tells you to do that.  Limit how much salt (sodium) you drink or eat, if your doctor tells you to do that. Activity   Avoid making quick movements. ? When you stand up from sitting in a chair, steady yourself until you feel okay. ? In the morning, first sit up on the side of the bed. When you feel okay, stand slowly while you hold onto something. Do this until you know that your balance is fine.  If you need to stand in one place for a long time, move your legs often. Tighten and relax the muscles in your legs while you are standing.  Do not drive or use heavy machinery if you feel dizzy.  Avoid bending down if you feel dizzy. Place items in your home so you can reach them easily without leaning over. Lifestyle  Do not use any products that contain nicotine or tobacco, such as cigarettes and e-cigarettes. If you need help quitting, ask your doctor.  Try to lower your stress level. You can do this by using methods such as yoga or meditation. Talk with your doctor if you need help. General instructions  Watch your dizziness for any changes.  Take over-the-counter and prescription medicines only as told by your doctor. Talk with your doctor if you think that you are dizzy because of a medicine that you are taking.  Tell a friend or a family member that you are feeling  dizzy. If he or she notices any changes in your behavior, have this person call your doctor.  Keep all follow-up visits as told by your doctor. This is important. Contact a doctor if:  Your dizziness does not go away.  Your dizziness or light-headedness gets worse.  You feel sick to your stomach (nauseous).  You have trouble hearing.  You have new symptoms.  You are unsteady on your feet.  You feel like the room is spinning. Get help right away if:  You throw up (vomit) or have watery poop (diarrhea), and you cannot eat or drink anything.  You have trouble: ? Talking. ? Walking. ? Swallowing. ? Using your arms, hands, or legs.  You feel generally weak.  You are not thinking clearly, or you have trouble forming sentences. A friend or family member may notice this.  You have: ? Chest pain. ? Pain in your belly (abdomen). ? Shortness of breath. ? Sweating.  Your vision changes.  You are bleeding.  You have a very bad headache.  You have neck pain or a stiff neck.  You have a fever. These symptoms may be an emergency. Do not wait to see if the symptoms will go away. Get medical help right away. Call your local emergency services (911 in the [place] Do not drive yourself to the hospital. Summary  Dizziness makes you feel unsteady or light-headed. You   may feel like you are about to pass out (faint).  Drink enough fluid to keep your pee (urine) clear or pale yellow. Do not drink alcohol.  Avoid making quick movements if you feel dizzy.  Watch your dizziness for any changes. This information is not intended to replace advice given to you by your health care provider. Make sure you discuss any questions you have with your health care provider. Document Revised: 08/09/2017 Document Reviewed: 08/23/2016 Else[employer] Patient Education  [employer].  Urinary Tract Infection, Adult A urinary tract infection (UTI) is an infection of any part of the urinary tract. The  urinary tract includes:  The kidneys.  The ureters.  The bladder.  The urethra. These organs make, store, and get rid of pee (urine) in the body. What are the causes? This is caused by germs (bacteria) in your genital area. These germs grow and cause swelling (inflammation) of your urinary tract. What increases the risk? You are more likely to develop this condition if:  You have a small, thin tube (catheter) to drain pee.  You cannot control when you pee or poop (incontinence).  You are female, and: ? You use these methods to prevent pregnancy:  A medicine that kills sperm (spermicide).  A device that blocks sperm (diaphragm). ? You have low levels of a female hormone (estrogen). ? You are pregnant.  You have genes that add to your risk.  You are sexually active.  You take antibiotic medicines.  You have trouble peeing because of: ? A prostate that is bigger than normal, if you are female. ? A blockage in the part of your body that drains pee from the bladder (urethra). ? A kidney stone. ? A nerve condition that affects your bladder (neurogenic bladder). ? Not getting enough to drink. ? Not peeing often enough.  You have other conditions, such as: ? Diabetes. ? A weak disease-fighting system (immune system). ? Sickle cell disease. ? Gout. ? Injury of the spine. What are the signs or symptoms? Symptoms of this condition include:  Needing to pee right away (urgently).  Peeing often.  Peeing small amounts often.  Pain or burning when peeing.  Blood in the pee.  Pee that smells bad or not like normal.  Trouble peeing.  Pee that is cloudy.  Fluid coming from the vagina, if you are female.  Pain in the belly or lower back. Other symptoms include:  Throwing up (vomiting).  No urge to eat.  Feeling mixed up (confused).  Being tired and grouchy (irritable).  A fever.  Watery poop (diarrhea). How is this treated? This condition may be treated  with:  Antibiotic medicine.  Other medicines.  Drinking enough water. Follow these instructions at home:  Medicines  Take over-the-counter and prescription medicines only as told by your doctor.  If you were prescribed an antibiotic medicine, take it as told by your doctor. Do not stop taking it even if you start to feel better. General instructions  Make sure you: ? Pee until your bladder is empty. ? Do not hold pee for a long time. ? Empty your bladder after sex. ? Wipe from front to back after pooping if you are a female. Use each tissue one time when you wipe.  Drink enough fluid to keep your pee pale yellow.  Keep all follow-up visits as told by your doctor. This is important. Contact a doctor if:  You do not get better after 1-2 days.  Your symptoms go away  and then come back. Get help right away if:  You have very bad back pain.  You have very bad pain in your lower belly.  You have a fever.  You are sick to your stomach (nauseous).  You are throwing up. Summary  A urinary tract infection (UTI) is an infection of any part of the urinary tract.  This condition is caused by germs in your genital area.  There are many risk factors for a UTI. These include having a small, thin tube to drain pee and not being able to control when you pee or poop.  Treatment includes antibiotic medicines for germs.  Drink enough fluid to keep your pee pale yellow. This information is not intended to replace advice given to you by your health care provider. Make sure you discuss any questions you have with your health care provider. Document Revised: 07/24/2018 Document Reviewed: 02/13/2018 Elsevier Patient Education  2020 [employer].

## 2020-02-16 LAB — GLUCOSE, CAPILLARY
Glucose-Capillary: 156 mg/dL — ABNORMAL HIGH (ref 70–99)
Glucose-Capillary: 175 mg/dL — ABNORMAL HIGH (ref 70–99)
Glucose-Capillary: 138 mg/dL — ABNORMAL HIGH (ref 70–99)
Glucose-Capillary: 233 mg/dL — ABNORMAL HIGH (ref 70–99)

## 2020-02-16 LAB — SARS CORONAVIRUS 2 (TAT 6-24 HRS): SARS Coronavirus 2: NEGATIVE

## 2020-02-16 NOTE — Discharge Summary (Signed)
Discharge Summary  Renee [name] Daman CSP:[id number] DOB: 16-Jan-1929  PCP: Judy Pimple, MD  Admit date: 02/12/2020 Discharge date: 02/16/2020  Time spent: 35 minutes  Recommendations for Outpatient Follow-up:  1. Follow-up with your PCP 2. Take your medications as prescribed 3. Continue PT OT with assistance and fall precautions  Discharge Diagnoses:  Active Hospital Problems   Diagnosis Date Noted  . Dizziness 02/12/2020  . Hypertensive urgency 02/12/2020  . Normocytic anemia 02/12/2020  . Anxiety and depression 08/25/2010  . Hemiplegia, late effect of cerebrovascular disease (HCC) 07/05/2010  . Type 2 diabetes, controlled, with retinopathy (HCC) 11/27/2006    Resolved Hospital Problems  No resolved problems to display.    Discharge Condition: Stable  Diet recommendation: Heart healthy carb modified diet  Vitals:   02/16/20 0435 02/16/20 0958  BP: 138/60 134/65  Pulse: 60 62  Resp: 18 17  Temp: 97.8 F (36.6 C) 98.1 F (36.7 C)  SpO2: 93% 95%    History of present illness:  GVS:YVGCY A Busickis a 86 y.o.femalewith medical history significant ofCVA with residual left-sided weakness,diabetes mellitus type 2,vertigo, hypertension, hyperlipidemia,CKD stage 3,and recurrent UTIs.At baseline she ambulates with the use of a walker. She presents with complaints of dizziness over the last 3 days. She reports feeling as though the room is spinning around her and as if she may pass out. Symptoms only present when changing positions and resolved when laying still. Notes associated symptoms of nausea when symptoms are present. Denies having any vomiting, fever, loss of consciousness, change in appetite, or diarrhea.  Patient had been seen in the emergency department 2 days ago forthesame symptoms. CT scan revealed chronic right basal ganglia infarct without acute abnormalities. She was diagnosed with a urinary tract infection and given one-time dose of  fosfomycin.She denies any complaints urinary frequency or dysuria symptoms since receiving treatment.  ED Course:Upon admission into the emergency department was pulse 52-59, blood pressures elevated up to 184/76, and all other vital signs within normal limits. Labs significant for low 11.1, BUN 15, andcreatinine 1.29.MRI of the brain was obtained and official read pending. Patient had been given 500 mL of normal saline IV fluids, 25 mg of meclizine, and Tylenol.   02/16/20: Seen and examined at her bedside.    No acute events overnight.  States she feels better today.  Denies dizziness, nausea, palpitation, chest pain, dyspnea.  PT has assessed and recommended SNF.  TOC assisting with SNF placement.     Hospital Course:  Principal Problem:   Dizziness Active Problems:   Type 2 diabetes, controlled, with retinopathy (HCC)   Hemiplegia, late effect of cerebrovascular disease (HCC)   Anxiety and depression   Hypertensive urgency   Normocytic anemia  Resolved dizziness with history of CVA and vertigo likely exacerbated by E. coli UTI:  Presented with complaints of dizziness with sensation of room spinning around her and feeling like she may pass out.   History of CVA and vertigo, relays no prior vestibular treatment and no prior antiemetics or vertigo medications.  CT head and MRI brain negative for any acute intracranial findings.  Assessed by PT with recommendation for SNF Continue PT OT with assistance and fall precautions TOC assisting with SNF placement  E. coli UTI, treated Urine culture taken on 02/10/2020 grew greater than 100,000 colonies of E. coli, pansensitive. Treated with fosfomycin 1 dose every 3 days x 3 doses, previous doses were 02/10/2020 and 02/13/2020.  Last dose on 02/15/2020.  Resolved dehydration and poor oral  intake due to nausea Good oral intake Denies nausea  Resolved bradycardia, likely iatrogenic:  Presented with heart rates in the 50s.  Resume  home metoprolol  Uncontrolled hypertension: Presented with SBP greater than 180  Continue home antihypertensives, amlodipine 10 mg daily, hydralazine 100 mg 3 times daily Continue home metoprolol Follow-up with your PCP  Chronic kidney disease stage IIIA:  Baseline creatinine appears to be 1.1 with GFR of 41.   She is currently at her baseline renal function.   Continue to avoid nephrotoxins  Follow-up with PCP  History of CVA with residual deficits:  Patient with residual left-sided weakness unchanged. PT assessment recommendation for SNF Continue PT OT with assistance and fall precautions  Diabetes mellitus type 2, well controlled:  Last hemoglobin A1c noted to be 6.1 on 01/05/2020.  Follow-up with your PCP  Chronic normocytic anemia:  Presented with hemoglobin 11.1 and MCV 93 No overt bleeding Follow-up with your PCP  Dyslipidemia: Continue Home medications includegemfibrozil 600 mg daily  Anxiety and depression -Continue Xanax and Zoloft -Follow-up with your PCP  CODE STATUS: DNR   Consults called:None   Discharge Exam: BP 134/65 (BP Location: Right Arm)   Pulse 62   Temp 98.1 F (36.7 C) (Oral)   Resp 17   Ht 5' 2"  (1.575 m)   Wt 85.5 kg   SpO2 95%   BMI 34.48 kg/m  . General: 86 y.o. year-old female well developed well nourished in no acute distress.  Alert and interactive.  Hard of hearing. . Cardiovascular: Regular rate and rhythm with no rubs or gallops.  No thyromegaly or JVD noted.   Marland Kitchen Respiratory: Clear to auscultation with no wheezes or rales. Good inspiratory effort. . Abdomen: Soft nontender nondistended with normal bowel sounds x4 quadrants. . Musculoskeletal: No lower extremity edema. 2/4 pulses in all 4 extremities. Marland Kitchen Psychiatry: Mood is appropriate for condition and setting  Discharge Instructions You were cared for by a hospitalist during your hospital stay. If you have any questions about your discharge medications or the  care you received while you were in the hospital after you are discharged, you can call the unit and asked to speak with the hospitalist on call if the hospitalist that took care of you is not available. Once you are discharged, your primary care physician will handle any further medical issues. Please note that NO REFILLS for any discharge medications will be authorized once you are discharged, as it is imperative that you return to your primary care physician (or establish a relationship with a primary care physician if you do not have one) for your aftercare needs so that they can reassess your need for medications and monitor your lab values.   Allergies as of 02/16/2020      Reactions   Diclofenac Sodium Other (See Comments)   REACTION: angioedema   Pioglitazone Swelling      Medication List    STOP taking these medications   REFRESH OP   valACYclovir 500 MG tablet Commonly known as: Valtrex     TAKE these medications   acetaminophen 325 MG tablet Commonly known as: TYLENOL Take 650 mg by mouth every 6 (six) hours as needed.   albuterol 108 (90 Base) MCG/ACT inhaler Commonly known as: VENTOLIN HFA Inhale 2 puffs into the lungs every 4 (four) hours as needed for wheezing or shortness of breath.   ALPRAZolam 0.5 MG tablet Commonly known as: XANAX TAKE 1/2 TO 1 TABLET BY MOUTH TWICE A DAY AS NEEDED  FOR ANXIETY What changed: See the new instructions.   amLODipine 10 MG tablet Commonly known as: NORVASC Take 1 tablet (10 mg total) by mouth daily.   aspirin 325 MG tablet Take 325 mg by mouth daily.   calcium-vitamin D 500-200 MG-UNIT tablet Commonly known as: OSCAL WITH D Take 1 tablet by mouth daily.   docusate sodium 100 MG capsule Commonly known as: COLACE Take 200 mg by mouth at bedtime.   ferrous sulfate 325 (65 FE) MG tablet Take 325 mg by mouth 2 (two) times daily.   gemfibrozil 600 MG tablet Commonly known as: LOPID TAKE 1 TABLET BY MOUTH EVERY DAY     glipiZIDE 2.5 MG 24 hr tablet Commonly known as: GLUCOTROL XL TAKE 1 TABLET BY MOUTH EVERY DAY WITH BREAKFAST What changed: See the new instructions.   hydrALAZINE 100 MG tablet Commonly known as: APRESOLINE Take 100 mg by mouth 3 (three) times daily.   metoprolol succinate 25 MG 24 hr tablet Commonly known as: TOPROL-XL Take 0.5 tablets (12.5 mg total) by mouth 2 (two) times daily.   mirtazapine 15 MG tablet Commonly known as: REMERON TAKE 1/2 TABLET BY MOUTH AT BEDTIME   multivitamin capsule Take 1 capsule by mouth daily.   ONE TOUCH ULTRA 2 w/Device Kit Check blood sugar once daily and as directed. Dx E11.9   OneTouch Delica Lancets 30G Misc CHECK BLOOD SUGAR ONCE DAILY AND AS DIRECTED. DX E11.9 What changed: See the new instructions.   OneTouch Ultra test strip Generic drug: glucose blood CHECK BLOOD SUGAR ONCE DAILY AND AS DIRECTED. DX E11.9   PATADAY OP Place 1 drop into both eyes daily.   sertraline 50 MG tablet Commonly known as: ZOLOFT TAKE 1 TABLET BY MOUTH EVERY DAY   trandolapril 4 MG tablet Commonly known as: MAVIK Take 1 tablet (4 mg total) by mouth daily.   vitamin B-12 1000 MCG tablet Commonly known as: CYANOCOBALAMIN Take 1,000 mcg by mouth daily.   Vitamin D 1000 units capsule Take 1,000 Units by mouth daily.            Durable Medical Equipment  (From admission, onward)         Start     Ordered   02/15/20 0957  For home use only DME 3 n 1  Once        02/15/20 0956         Allergies  Allergen Reactions  . Diclofenac Sodium Other (See Comments)    REACTION: angioedema  . Pioglitazone Swelling    Follow-up Information    Tower, Audrie Gallus, MD. Call in 1 day(s).   Specialties: Family Medicine, Radiology Why: please call for a post hospital follow up appointment Contact information: [address] [place] [address] [phone]                The results of significant diagnostics from this  hospitalization (including imaging, microbiology, ancillary and laboratory) are listed below for reference.    Significant Diagnostic Studies: CT Head Wo Contrast  Result Date: 02/10/2020 CLINICAL DATA:  Dizziness. EXAM: CT HEAD WITHOUT CONTRAST TECHNIQUE: Contiguous axial images were obtained from the base of the skull through the vertex without intravenous contrast. COMPARISON:  October 31, 2010 FINDINGS: Brain: There is mild to moderate severity cerebral atrophy with widening of the extra-axial spaces and ventricular dilatation. There are areas of decreased attenuation within the white matter tracts of the supratentorial brain, consistent with microvascular disease changes. A chronic right  basal ganglia lacunar infarct is seen. This is present on the prior study. Vascular: No hyperdense vessel or unexpected calcification. Skull: Normal. Negative for fracture or focal lesion. Sinuses/Orbits: No acute finding. Evidence of prior scleral banding is noted on the right. Other: None. IMPRESSION: 1. Generalized cerebral atrophy. 2. Chronic right basal ganglia lacunar infarct. 3. No acute intracranial abnormality. Electronically Signed   By: Virgina Norfolk M.D.   On: 02/10/2020 20:27   MR BRAIN WO CONTRAST  Result Date: 02/12/2020 CLINICAL DATA:  Dizziness. History of stroke with residual left hemiparesis. EXAM: MRI HEAD WITHOUT CONTRAST TECHNIQUE: Multiplanar, multiecho pulse sequences of the brain and surrounding structures were obtained without intravenous contrast. COMPARISON:  Head CT 02/10/2020 and MRI 06/02/2010 FINDINGS: Brain: No acute infarct, mass, midline shift, or extra-axial fluid collection is identified. A chronic infarct is again noted involving the right basal ganglia and corona radiata with associated chronic blood products and ex vacuo dilatation of the body of the right lateral ventricle. Small T2 hyperintensities elsewhere in the cerebral white matter bilaterally have mildly progressed from  the prior MRI and are nonspecific but compatible with mild chronic small vessel ischemic disease. There is mild cerebral atrophy. Vascular: Major intracranial vascular flow voids are preserved. Skull and upper cervical spine: Unremarkable bone marrow signal. Sinuses/Orbits: Bilateral cataract extraction. Right scleral buckle. Clear paranasal sinuses. Left mastoid and middle ear effusions, also present in 2011. Other: None. IMPRESSION: 1. No acute intracranial abnormality. 2. Mild chronic small vessel ischemic disease. 3. Chronic right basal ganglia and deep white matter infarct. 4. Chronic or recurrent left mastoid and middle ear effusions. Electronically Signed   By: Logan Bores M.D.   On: 02/12/2020 16:17    Microbiology: Recent Results (from the past 240 hour(s))  Urine culture     Status: Abnormal   Collection Time: 02/10/20  8:50 PM   Specimen: Urine  Result Value Ref Range Status   Specimen Description Urine  Final   Special Requests   Final    NONE Performed at [facility], [address] [place], [place] [address]    Culture >=100,000 COLONIES/mL ESCHERICHIA COLI (A)  Final   Report Status 02/13/2020 FINAL  Final   Organism ID, Bacteria ESCHERICHIA COLI (A)  Final      Susceptibility   Escherichia coli - MIC*    AMPICILLIN 8 SENSITIVE Sensitive     CEFAZOLIN <=4 SENSITIVE Sensitive     CEFEPIME <=0.12 SENSITIVE Sensitive     CEFTAZIDIME <=1 SENSITIVE Sensitive     CEFTRIAXONE <=0.25 SENSITIVE Sensitive     CIPROFLOXACIN <=0.25 SENSITIVE Sensitive     GENTAMICIN <=1 SENSITIVE Sensitive     IMIPENEM <=0.25 SENSITIVE Sensitive     TRIMETH/SULFA <=20 SENSITIVE Sensitive     AMPICILLIN/SULBACTAM 4 SENSITIVE Sensitive     PIP/TAZO <=4 SENSITIVE Sensitive     * >=100,000 COLONIES/mL ESCHERICHIA COLI  SARS Coronavirus 2 by RT PCR (hospital order, performed in [facility] hospital lab) Nasopharyngeal Nasopharyngeal Swab     Status: None   Collection Time: 02/12/20  6:44 PM    Specimen: Nasopharyngeal Swab  Result Value Ref Range Status   SARS Coronavirus 2 NEGATIVE NEGATIVE Final    Comment: (NOTE) SARS-CoV-2 target nucleic acids are NOT DETECTED.  The SARS-CoV-2 RNA is generally detectable in upper and lower respiratory specimens during the acute phase of infection. The lowest concentration of SARS-CoV-2 viral copies this assay can detect is 250 copies / mL. A negative result does  not preclude SARS-CoV-2 infection and should not be used as the sole basis for treatment or other patient management decisions.  A negative result may occur with improper specimen collection / handling, submission of specimen other than nasopharyngeal swab, presence of viral mutation(s) within the areas targeted by this assay, and inadequate number of viral copies (<250 copies / mL). A negative result must be combined with clinical observations, patient history, and epidemiological information.  Fact Sheet for Patients:   StrictlyIdeas.no  Fact Sheet for Healthcare Providers: BankingDealers.co.za  This test is not yet approved or  cleared by the [place] FDA and has been authorized for detection and/or diagnosis of SARS-CoV-2 by FDA under an Emergency Use Authorization (EUA).  This EUA will remain in effect (meaning this test can be used) for the duration of the COVID-19 declaration under Section 564(b)(1) of the Act, 21 U.S.C. section 360bbb-3(b)(1), unless the authorization is terminated or revoked sooner.  Performed at [facility], [place] [address] [place], [place] [address]   SARS CORONAVIRUS 2 (TAT 6-24 HRS) Nasopharyngeal Nasopharyngeal Swab     Status: None   Collection Time: 02/15/20 11:31 PM   Specimen: Nasopharyngeal Swab  Result Value Ref Range Status   SARS Coronavirus 2 NEGATIVE NEGATIVE Final    Comment: (NOTE) SARS-CoV-2 target nucleic acids are NOT DETECTED.  The SARS-CoV-2 RNA is generally  detectable in upper and lower respiratory specimens during the acute phase of infection. Negative results do not preclude SARS-CoV-2 infection, do not rule out co-infections with other pathogens, and should not be used as the sole basis for treatment or other patient management decisions. Negative results must be combined with clinical observations, patient history, and epidemiological information. The expected result is Negative.  Fact Sheet for Patients: SugarRoll.be  Fact Sheet for Healthcare Providers: https://www.woods-mathews.com/  This test is not yet approved or cleared by the [place] FDA and  has been authorized for detection and/or diagnosis of SARS-CoV-2 by FDA under an Emergency Use Authorization (EUA). This EUA will remain  in effect (meaning this test can be used) for the duration of the COVID-19 declaration under Se ction 564(b)(1) of the Act, 21 U.S.C. section 360bbb-3(b)(1), unless the authorization is terminated or revoked sooner.  Performed at [facility], [place] [address], [place], [place] [address]      Labs: Basic Metabolic Panel: Recent Labs  Lab 02/10/20 1123 02/12/20 1331 02/13/20 0304  NA 141 142 139  K 4.5 4.3 4.0  CL 105 106 104  CO2 25 26 25   GLUCOSE 135* 123* 111*  BUN 18 15 15   CREATININE 1.42* 1.29* 1.18*  CALCIUM 9.0 8.6* 8.4*   Liver Function Tests: No results for input(s): AST, ALT, ALKPHOS, BILITOT, PROT, ALBUMIN in the last 168 hours. No results for input(s): LIPASE, AMYLASE in the last 168 hours. No results for input(s): AMMONIA in the last 168 hours. CBC: Recent Labs  Lab 02/10/20 1123 02/12/20 1331 02/13/20 0304  WBC 6.3 4.5 4.3  HGB 12.7 11.1* 10.8*  HCT 41.1 36.3 35.2*  MCV 94.1 93.8 92.4  PLT 187 152 161   Cardiac Enzymes: No results for input(s): CKTOTAL, CKMB, CKMBINDEX, TROPONINI in the last 168 hours. BNP: BNP (last 3 results) No results for input(s): BNP  in the last 8760 hours.  ProBNP (last 3 results) No results for input(s): PROBNP in the last 8760 hours.  CBG: Recent Labs  Lab 02/14/20 2040 02/15/20 0647 02/15/20 1119 02/15/20 1652 02/16/20 0647  GLUCAP 201*  132* 159* 147* 156*       Signed:  Kayleen Memos, MD [facility] 02/16/2020, 10:20 AM

## 2020-02-16 NOTE — Plan of Care (Signed)
  Problem: Activity: Goal: Risk for activity intolerance will decrease Outcome: Progressing   Problem: Skin Integrity: Goal: Risk for impaired skin integrity will decrease Outcome: Progressing   Problem: Urinary Elimination: Goal: Signs and symptoms of infection will decrease Outcome: Progressing

## 2020-02-16 NOTE — Care Management Important Message (Signed)
Important Message  Patient Details  Name: Renee [name] MRN: [id number] Date of Birth: [birth year]   Medicare Important Message Given:  Yes     Tharon Bomar 02/16/2020, 10:42 AM

## 2020-02-17 LAB — GLUCOSE, CAPILLARY
Glucose-Capillary: 105 mg/dL — ABNORMAL HIGH (ref 70–99)
Glucose-Capillary: 158 mg/dL — ABNORMAL HIGH (ref 70–99)
Glucose-Capillary: 163 mg/dL — ABNORMAL HIGH (ref 70–99)
Glucose-Capillary: 161 mg/dL — ABNORMAL HIGH (ref 70–99)

## 2020-02-17 NOTE — Progress Notes (Signed)
Physical Therapy Treatment Patient Details Name: Renee [name] MRN: [id number] DOB: Feb 21, 1929 Today's Date: 02/17/2020    History of Present Illness Renee [name] is a 86 y.o. female with medical history significant of CVA with residual left-sided weakness, diabetes mellitus type 2, vertigo, hypertension, hyperlipidemia, CKD stage 3, and recurrent UTIs.  Admitted for dizziness with continued work-up.     PT Comments    Continuing work on functional mobility and activity tolerance;  Session focused on functional transfer; today [name] required mod assist for safety and support with coming to stand, and taking pivot steps OOB to chair; She attributes her decr activity tolerance to not getting much rest overnight   Follow Up Recommendations  SNF;Supervision/Assistance - 24 hour     Equipment Recommendations  None recommended by PT    Recommendations for Other Services       Precautions / Restrictions Precautions Precautions: Fall    Mobility  Bed Mobility Overal bed mobility: Needs Assistance Bed Mobility: Supine to Sit     Supine to sit: HOB elevated;Min assist     General bed mobility comments: Slow moving; min handheld assist to pull to sit and square off hips at EOB  Transfers Overall transfer level: Needs assistance Equipment used: Rolling walker (2 wheeled) Transfers: Sit to/from Stand Sit to Stand: Mod assist Stand pivot transfers: Mod assist       General transfer comment: Mod assist to initate with forward lean; required 2 attempts before successfully gettign to stand; Mod assist also for support as pt transitioned hands from bed to RW; short, unsteady pivotal steps bed to chair; tremulous LEs in standing, requiring vigilant guard  Ambulation/Gait                 Stairs             Wheelchair Mobility    Modified Rankin (Stroke Patients Only)       Balance     Sitting balance-Leahy Scale: Fair       Standing balance-Leahy  Scale: Poor                              Cognition Arousal/Alertness: Awake/alert Behavior During Therapy: Flat affect Overall Cognitive Status: Within Functional Limits for tasks assessed                                        Exercises      General Comments General comments (skin integrity, edema, etc.): No reports of diziness this session      Pertinent Vitals/Pain Pain Assessment: No/denies pain    Home Living                      Prior Function            PT Goals (current goals can now be found in the care plan section) Acute Rehab PT Goals Patient Stated Goal: OOB to eat lunch PT Goal Formulation: With patient Time For Goal Achievement: 02/27/20 Potential to Achieve Goals: Good Progress towards PT goals: Not progressing toward goals - comment (less activity tolerance today)    Frequency    Min 2X/week      PT Plan Current plan remains appropriate    Co-evaluation              AM-PAC PT "6  Clicks" Mobility   Outcome Measure  Help needed turning from your back to your side while in a flat bed without using bedrails?: A Lot Help needed moving from lying on your back to sitting on the side of a flat bed without using bedrails?: A Lot Help needed moving to and from a bed to a chair (including a wheelchair)?: A Lot Help needed standing up from a chair using your arms (e.g., wheelchair or bedside chair)?: A Lot Help needed to walk in hospital room?: A Lot Help needed climbing 3-5 steps with a railing? : Total 6 Click Score: 11    End of Session Equipment Utilized During Treatment: Gait belt Activity Tolerance: Patient tolerated treatment well;Patient limited by fatigue Patient left: with call bell/phone within reach;in chair Nurse Communication: Mobility status PT Visit Diagnosis: Unsteadiness on feet (R26.81);Other abnormalities of gait and mobility (R26.89);Muscle weakness (generalized) (M62.81)     Time:  6047-9987 PT Time Calculation (min) (ACUTE ONLY): 12 min  Charges:  $Therapeutic Activity: 8-22 mins                     Roney Marion, PT  Acute Rehabilitation Services Pager [phone] Office [place] 02/17/2020, 2:19 PM

## 2020-02-17 NOTE — Clinical Social Work Note (Signed)
CSW talked with [facility] admissions director Magda Paganini (11:43 am) regarding patient and insurance authorization. Was advised that she called [employer] and was advised that patient's information is still under clinical review. Magda Paganini added that she sent more therapy notes to [facility]. CSW will be advised when auth received. CSW will continue to follow and provide CSW services as needed through discharge.  Marina Boerner Givens, MSW, LCSW Licensed Clinical Social Worker [place] [phone]

## 2020-02-17 NOTE — Progress Notes (Signed)
No overnight events.  Continue to await skilled nursing facility placement and insurance authorization.  Please see discharge done on 6/29. [name] DO

## 2020-02-17 NOTE — Plan of Care (Signed)
  Problem: Safety: Goal: Ability to remain free from injury will improve Outcome: Progressing   

## 2020-02-18 ENCOUNTER — Inpatient Hospital Stay: Payer: Medicare HMO | Admitting: Family Medicine

## 2020-02-18 LAB — GLUCOSE, CAPILLARY
Glucose-Capillary: 126 mg/dL — ABNORMAL HIGH (ref 70–99)
Glucose-Capillary: 244 mg/dL — ABNORMAL HIGH (ref 70–99)
Glucose-Capillary: 196 mg/dL — ABNORMAL HIGH (ref 70–99)
Glucose-Capillary: 201 mg/dL — ABNORMAL HIGH (ref 70–99)

## 2020-02-18 NOTE — Progress Notes (Signed)
No overnight events.  Continue to await skilled nursing facility placement and insurance authorization.  Please see discharge done on 6/29. [name] DO

## 2020-02-19 LAB — GLUCOSE, CAPILLARY
Glucose-Capillary: 136 mg/dL — ABNORMAL HIGH (ref 70–99)
Glucose-Capillary: 242 mg/dL — ABNORMAL HIGH (ref 70–99)
Glucose-Capillary: 172 mg/dL — ABNORMAL HIGH (ref 70–99)
Glucose-Capillary: 163 mg/dL — ABNORMAL HIGH (ref 70–99)

## 2020-02-19 NOTE — Plan of Care (Signed)
  Problem: Activity: Goal: Risk for activity intolerance will decrease Outcome: Progressing   Problem: Skin Integrity: Goal: Risk for impaired skin integrity will decrease Outcome: Progressing   

## 2020-02-19 NOTE — Progress Notes (Signed)
Continue to await insurance authorization for rehab.  No overnight events. [name] DO

## 2020-02-19 NOTE — Progress Notes (Signed)
Occupational Therapy Treatment Patient Details Name: Renee [name] MRN: [id number] DOB: Sep 06, 1928 Today's Date: 02/19/2020    History of present illness Renee [name] is a 86 y.o. female with medical history significant of CVA with residual left-sided weakness, diabetes mellitus type 2, vertigo, hypertension, hyperlipidemia, CKD stage 3, and recurrent UTIs.  Admitted for dizziness with continued work-up.    OT comments  Pt seen for functional mobility progression with pt able to complete household distance functional mobility with RW and MIN- MOD A. Pt presents with L sided weakness, decreased activity tolerance and impaired balance impacting pts ability to complete BADLs. Issued pt BUE HEP to facilitate increased activity tolerance and functional ROM for higher level functional mobility. Pt completed therex as indicated below with no c/o pain. Agree with DC plan below, will follow acutely per POC.   Follow Up Recommendations  SNF    Equipment Recommendations  3 in 1 bedside commode    Recommendations for Other Services      Precautions / Restrictions Precautions Precautions: Fall Restrictions Weight Bearing Restrictions: No       Mobility Bed Mobility               General bed mobility comments: OOB in recliner  Transfers Overall transfer level: Needs assistance Equipment used: Rolling walker (2 wheeled) Transfers: Sit to/from Stand Sit to Stand: Min assist         General transfer comment: MIN A and increased time and effort to power up into standing, MOD A to manage RW during in room functional mobility. Pt able to ambulate to door and back to recliner with MOD - MIN A    Balance Overall balance assessment: Needs assistance Sitting-balance support: Bilateral upper extremity supported;Feet supported Sitting balance-Leahy Scale: Fair     Standing balance support: Bilateral upper extremity supported Standing balance-Leahy Scale: Poor Standing balance comment:  reliant on BUE support                           ADL either performed or assessed with clinical judgement   ADL Overall ADL's : Needs assistance/impaired     Grooming: Set up;Sitting Grooming Details (indicate cue type and reason): sister in law setting pt up for oral care upon exit         Upper Body Dressing : Minimal assistance;Sitting Upper Body Dressing Details (indicate cue type and reason): to don hospital gown as back side cover Lower Body Dressing: Maximal assistance;Sit to/from stand Lower Body Dressing Details (indicate cue type and reason): to shoes from recliner; pt reprots caregiver asssits with LB dressing at baseline Toilet Transfer: Minimal assistance;Moderate assistance;Ambulation;RW Toilet Transfer Details (indicate cue type and reason): simulated via functional mobility; MIN A to stand, MOD A at times to navigate RW         Functional mobility during ADLs: Minimal assistance;Moderate assistance;Rolling walker General ADL Comments: pt with residudal L sided weakness from previous CVA     Vision       Perception     Praxis      Cognition Arousal/Alertness: Awake/alert Behavior During Therapy: WFL for tasks assessed/performed Overall Cognitive Status: Within Functional Limits for tasks assessed                                          Exercises General Exercises - Upper Extremity Shoulder Flexion:  AROM;Both;Seated;5 reps Shoulder Extension: AROM;Right;Seated;5 reps Shoulder ADduction: 5 reps Shoulder Horizontal ABduction: AROM;Both;Seated;5 reps Shoulder Horizontal ADduction: AROM;Both;Seated;5 reps Elbow Flexion: AROM;Both;5 reps;Seated Elbow Extension: AROM;Both;5 reps;Seated Wrist Flexion: AROM;Both;5 reps;Seated Wrist Extension: AROM;Both;5 reps;Seated Hand Exercises Forearm Supination: AROM;Both;5 reps;Seated Forearm Pronation: AROM;5 reps;Both;Seated Opposition: AROM;Both;5 reps;Seated   Shoulder Instructions        General Comments no c/o dizziness; VSS, pts sister in law present. noted some L inattention with completing UB ADLS. issued pt BUE HEp    Pertinent Vitals/ Pain       Pain Assessment: No/denies pain  Home Living                                          Prior Functioning/Environment              Frequency  Min 2X/week        Progress Toward Goals  OT Goals(current goals can now be found in the care plan section)  Progress towards OT goals: Progressing toward goals  Acute Rehab OT Goals Patient Stated Goal: OOB to eat lunch OT Goal Formulation: With patient Time For Goal Achievement: 02/29/20 Potential to Achieve Goals: Good  Plan Discharge plan remains appropriate;Frequency remains appropriate    Co-evaluation                 AM-PAC OT "6 Clicks" Daily Activity     Outcome Measure     Help from another person taking care of personal grooming?: A Little Help from another person toileting, which includes using toliet, bedpan, or urinal?: A Lot Help from another person bathing (including washing, rinsing, drying)?: A Lot Help from another person to put on and taking off regular upper body clothing?: A Little Help from another person to put on and taking off regular lower body clothing?: A Lot 6 Click Score: 12    End of Session Equipment Utilized During Treatment: Gait belt;Rolling walker  OT Visit Diagnosis: Unsteadiness on feet (R26.81);Other abnormalities of gait and mobility (R26.89);Muscle weakness (generalized) (M62.81);History of falling (Z91.81)   Activity Tolerance Patient tolerated treatment well   Patient Left in chair;with call bell/phone within reach;with family/visitor present   Nurse Communication Mobility status        Time: 8003-4917 OT Time Calculation (min): 25 min  Charges: OT General Charges $OT Visit: 1 Visit OT Treatments $Therapeutic Activity: 8-22 mins $Therapeutic Exercise: 8-22 mins  Lanier Clam., COTA/L Acute Rehabilitation Services [phone]    Ihor Gully 02/19/2020, 4:20 PM

## 2020-02-19 NOTE — Clinical Social Work Note (Signed)
Talked with Magda Paganini (8:38 am), admissions director at [facility] [phone]) regarding patient and insurance auth, and she was hopeful to get authorization today. Talked with Magda Paganini again at 3:10 pm and clinicals still under review.   Received call from Rollene Fare (daughter-in-law) [phone], regarding status and was advised that we are still waiting on insurance auth. She requested that CSW call insurance company to get more information. Call made to [employer] (automated call) and was advised that the status is approved and call reference number - [id number]. CSW followed up with Magda Paganini and was advised that she had been told the same thing and after talking with a representative was advised that the approval means that they received all of the necessary information. Call made to sister-in-law Rollene Fare [phone]) and update provided. CSW will continue to follow, awaiting decision from [employer].  Ainhoa Rallo Givens, MSW, LCSW Licensed Clinical Social Worker [place] [phone]

## 2020-02-20 LAB — GLUCOSE, CAPILLARY
Glucose-Capillary: 151 mg/dL — ABNORMAL HIGH (ref 70–99)
Glucose-Capillary: 234 mg/dL — ABNORMAL HIGH (ref 70–99)
Glucose-Capillary: 192 mg/dL — ABNORMAL HIGH (ref 70–99)
Glucose-Capillary: 198 mg/dL — ABNORMAL HIGH (ref 70–99)

## 2020-02-20 NOTE — Progress Notes (Signed)
PROGRESS NOTE  Renee [name] RJJ:[id number] DOB: 21-Feb-1929 DOA: 02/12/2020 PCP: Abner Greenspan, MD  HPI/Recap of past 24 hours: HPI: Renee [name] is a 86 y.o. female with medical history significant of CVA with residual left-sided weakness, diabetes mellitus type 2, vertigo, hypertension, hyperlipidemia, CKD stage 3, and recurrent UTIs.  At baseline she ambulates with the use of a walker.  She presents with complaints of dizziness over the last 3 days.  Found to have UTI and was treated.  Stay in hospital lengthen due to waiting for SNF approval by insurance.   Assessment/Plan: Principal Problem:   Dizziness Active Problems:   Type 2 diabetes, controlled, with retinopathy (HCC)   Hemiplegia, late effect of cerebrovascular disease (HCC)   Anxiety and depression   Hypertensive urgency   Normocytic anemia  Improving dizziness with history of CVA and vertigo likely exacerbated by E. coli UTI:  Presented with complaints of dizziness with sensation of room spinning around her and feeling like she may pass out.   History of CVA and vertigo, relays no prior vestibular treatment and no prior antiemetics or vertigo medications.  CT head and MRI brain negative for any acute intracranial findings.  Assessed by PT with recommendation for SNF Continue PT OT with assistance and fall precautions TOC assisting with SNF placement  E. coli UTI Urine culture taken on 02/10/2020 grew greater than 100,000 colonies of E. coli, pansensitive. Treated with fosfomycin 1 dose every 3 days x 3 doses  Improving dehydration and poor oral intake due to nausea Continue gentle IV fluid hydration Oral intake improving, denies nausea without dyspnea Continue to encourage increase oral protein calorie intake  Resolved bradycardia, likely iatrogenic:  Presented with heart rates in the 50s.  Resume home metoprolol  Uncontrolled hypertension: Presented with SBP  greater than 180  Continue home meds   Chronic kidney disease stage IIIA:  Baseline creatinine appears to be 1.1 with GFR of 41.   She is currently at her baseline renal function.   Continue to avoid nephrotoxins   History of CVA with residual deficits:  Patient with residual left-sided weakness unchanged. PT assessment recommendation for SNF Fall precautions in place Continue PT/OT with assistance and fall precautions  Diabetes mellitus type 2, well controlled:  Last hemoglobin A1c noted to be 6.1 on 01/05/2020.    Chronic normocytic anemia:  Presented with hemoglobin 11.1 and MCV 93 No overt bleeding Continue to monitor, transfuse with hemoglobin less than 7.0.  Dyslipidemia:  Continue Home medications include gemfibrozil 600 mg daily  Anxiety and depression -Continue Xanax and Zoloft  CODE STATUS: DNR   DVT prophylaxis: lovneox subcu daily  Consults called: None    Dispo:  Patient From: Home.  Lives alone. Home  Planned Disposition: SNF.   Expected discharge date: continue to await SNF placement    Objective: Awaiting placement to SNF. Vitals:   02/19/20 1805 02/19/20 2048 02/20/20 0530 02/20/20 0817  BP: 111/67 (!) 144/59 (!) 152/62 (!) 147/65  Pulse: 66 67 60 61  Resp: 20 18 16    Temp: 97.8 F (36.6 C) 98.3 F (36.8 C) 97.8 F (36.6 C)   TempSrc: Oral Oral Oral   SpO2: 94% 97% 94%   Weight:      Height:        Intake/Output Summary (Last 24 hours) at 02/20/2020 0911 Last data filed at 02/20/2020 0600 Gross per 24 hour  Intake 720 ml  Output 500 ml  Net 220 ml   Filed  Weights   02/16/20 0435 02/17/20 0457 02/18/20 0436  Weight: 85.5 kg 83.9 kg 83.1 kg    Exam:  . In bed, resting, ate 100% of breakfast  Data Reviewed: CBC: No results for input(s): WBC, NEUTROABS, HGB, HCT, MCV, PLT in the last 168 hours. Basic Metabolic Panel: No results for input(s): NA, K, CL, CO2, GLUCOSE, BUN, CREATININE, CALCIUM, MG, PHOS in the last 168 hours. GFR: Estimated Creatinine Clearance:  32.9 mL/min (A) (by C-G formula based on SCr of 1.18 mg/dL (H)). Liver Function Tests: No results for input(s): AST, ALT, ALKPHOS, BILITOT, PROT, ALBUMIN in the last 168 hours. No results for input(s): LIPASE, AMYLASE in the last 168 hours. No results for input(s): AMMONIA in the last 168 hours. Coagulation Profile: No results for input(s): INR, PROTIME in the last 168 hours. Cardiac Enzymes: No results for input(s): CKTOTAL, CKMB, CKMBINDEX, TROPONINI in the last 168 hours. BNP (last 3 results) No results for input(s): PROBNP in the last 8760 hours. HbA1C: No results for input(s): HGBA1C in the last 72 hours. CBG: Recent Labs  Lab 02/19/20 0647 02/19/20 1116 02/19/20 1645 02/19/20 2100 02/20/20 0647  GLUCAP 136* 242* 172* 163* 151*   Lipid Profile: No results for input(s): CHOL, HDL, LDLCALC, TRIG, CHOLHDL, LDLDIRECT in the last 72 hours. Thyroid Function Tests: No results for input(s): TSH, T4TOTAL, FREET4, T3FREE, THYROIDAB in the last 72 hours. Anemia Panel: No results for input(s): VITAMINB12, FOLATE, FERRITIN, TIBC, IRON, RETICCTPCT in the last 72 hours. Urine analysis:    Component Value Date/Time   COLORURINE YELLOW 02/12/2020 [place] 02/12/2020 1423   LABSPEC 1.014 02/12/2020 1423   PHURINE 6.0 02/12/2020 1423   GLUCOSEU NEGATIVE 02/12/2020 1423   HGBUR NEGATIVE 02/12/2020 1423   HGBUR negative 03/27/2010 [place] 02/12/2020 1423   BILIRUBINUR neg. 06/18/2014 [place] 02/12/2020 1423   PROTEINUR NEGATIVE 02/12/2020 1423   UROBILINOGEN 0.2 06/18/2014 1155   UROBILINOGEN 0.2 09/04/2012 1135   NITRITE NEGATIVE 02/12/2020 1423   LEUKOCYTESUR SMALL (A) 02/12/2020 1423   Sepsis Labs: @LABRCNTIP (procalcitonin:4,lacticidven:4)  ) Recent Results (from the past 240 hour(s))  Urine culture     Status: Abnormal   Collection Time: 02/10/20  8:50 PM   Specimen: Urine  Result Value Ref Range Status   Specimen  Description Urine  Final   Special Requests   Final    NONE Performed at [facility], [place] [address] [place], [place] [address]    Culture >=100,000 COLONIES/mL ESCHERICHIA COLI (A)  Final   Report Status 02/13/2020 FINAL  Final   Organism ID, Bacteria ESCHERICHIA COLI (A)  Final      Susceptibility   Escherichia coli - MIC*    AMPICILLIN 8 SENSITIVE Sensitive     CEFAZOLIN <=4 SENSITIVE Sensitive     CEFEPIME <=0.12 SENSITIVE Sensitive     CEFTAZIDIME <=1 SENSITIVE Sensitive     CEFTRIAXONE <=0.25 SENSITIVE Sensitive     CIPROFLOXACIN <=0.25 SENSITIVE Sensitive     GENTAMICIN <=1 SENSITIVE Sensitive     IMIPENEM <=0.25 SENSITIVE Sensitive     TRIMETH/SULFA <=20 SENSITIVE Sensitive     AMPICILLIN/SULBACTAM 4 SENSITIVE Sensitive     PIP/TAZO <=4 SENSITIVE Sensitive     * >=100,000 COLONIES/mL ESCHERICHIA COLI  SARS Coronavirus 2 by RT PCR (hospital order, performed in [place] hospital lab) Nasopharyngeal Nasopharyngeal Swab     Status: None   Collection Time: 02/12/20  6:44 PM   Specimen:  Nasopharyngeal Swab  Result Value Ref Range Status   SARS Coronavirus 2 NEGATIVE NEGATIVE Final    Comment: (NOTE) SARS-CoV-2 target nucleic acids are NOT DETECTED.  The SARS-CoV-2 RNA is generally detectable in upper and lower respiratory specimens during the acute phase of infection. The lowest concentration of SARS-CoV-2 viral copies this assay can detect is 250 copies / mL. A negative result does not preclude SARS-CoV-2 infection and should not be used as the sole basis for treatment or other patient management decisions.  A negative result may occur with improper specimen collection / handling, submission of specimen other than nasopharyngeal swab, presence of viral mutation(s) within the areas targeted by this assay, and inadequate number of viral copies (<250 copies / mL). A negative result must be combined with clinical observations, patient history, and epidemiological  information.  Fact Sheet for Patients:   StrictlyIdeas.no  Fact Sheet for Healthcare Providers: BankingDealers.co.za  This test is not yet approved or  cleared by the [place] FDA and has been authorized for detection and/or diagnosis of SARS-CoV-2 by FDA under an Emergency Use Authorization (EUA).  This EUA will remain in effect (meaning this test can be used) for the duration of the COVID-19 declaration under Section 564(b)(1) of the Act, 21 U.S.C. section 360bbb-3(b)(1), unless the authorization is terminated or revoked sooner.  Performed at [facility], [place] [address] [place], [place] [address]   SARS CORONAVIRUS 2 (TAT 6-24 HRS) Nasopharyngeal Nasopharyngeal Swab     Status: None   Collection Time: 02/15/20 11:31 PM   Specimen: Nasopharyngeal Swab  Result Value Ref Range Status   SARS Coronavirus 2 NEGATIVE NEGATIVE Final    Comment: (NOTE) SARS-CoV-2 target nucleic acids are NOT DETECTED.  The SARS-CoV-2 RNA is generally detectable in upper and lower respiratory specimens during the acute phase of infection. Negative results do not preclude SARS-CoV-2 infection, do not rule out co-infections with other pathogens, and should not be used as the sole basis for treatment or other patient management decisions. Negative results must be combined with clinical observations, patient history, and epidemiological information. The expected result is Negative.  Fact Sheet for Patients: SugarRoll.be  Fact Sheet for Healthcare Providers: https://www.woods-mathews.com/  This test is not yet approved or cleared by the [place] FDA and  has been authorized for detection and/or diagnosis of SARS-CoV-2 by FDA under an Emergency Use Authorization (EUA). This EUA will remain  in effect (meaning this test can be used) for the duration of the COVID-19 declaration under Se ction  564(b)(1) of the Act, 21 U.S.C. section 360bbb-3(b)(1), unless the authorization is terminated or revoked sooner.  Performed at [facility], [place] [address], [place], [place] [address]       Studies: No results found.  Scheduled Meds: . ALPRAZolam  0.5 mg Oral BID  . amLODipine  10 mg Oral Daily  . aspirin  325 mg Oral Daily  . calcium-vitamin D  1 tablet Oral Daily  . docusate sodium  200 mg Oral QHS  . enoxaparin (LOVENOX) injection  40 mg Subcutaneous Q24H  . ferrous sulfate  325 mg Oral BID  . gemfibrozil  600 mg Oral Daily  . hydrALAZINE  100 mg Oral TID  . hydrocortisone cream   Topical BID  . insulin aspart  0-9 Units Subcutaneous TID WC  . metoprolol succinate  12.5 mg Oral BID  . mirtazapine  7.5 mg Oral QHS  . multivitamin with minerals  1 tablet Oral Daily  .  olopatadine  1 drop Both Eyes Daily  . sertraline  50 mg Oral Daily  . sodium chloride flush  3 mL Intravenous Q12H  . trandolapril  4 mg Oral Daily  . vitamin B-12  1,000 mcg Oral Daily    Continuous Infusions:    LOS: 7 days     [name], DO [facility]   If 7PM-7AM, please contact night-coverage [email or web address] Password [email or web address] 02/20/2020, 9:11 AM

## 2020-02-20 NOTE — TOC Progression Note (Addendum)
Transition of Care [facility]) - Progression Note    Patient Details  Name: Renee [name] MRN: [id number] Date of Birth: [birth year]  [facility] CM/SW [place], [place] Phone Number: 02/20/2020, 11:51 AM  Clinical Narrative:     CSW attempted follow-up with [place] [facility] liaison [phone] to inquire on insurance authorization, informed it is not back and [place] [facility] does not review during the weekend. CSW will continue to follow.   Expected Discharge Plan: [place] Barriers to Discharge: Continued Medical Work up, Ship broker  Expected Discharge Plan and Services Expected Discharge Plan: [place] In-house Referral: Clinical Social Work     Living arrangements for the past 2 months: Single Family Home Expected Discharge Date: 02/16/20                                     Social Determinants of Health (SDOH) Interventions    Readmission Risk Interventions No flowsheet data found.

## 2020-02-21 LAB — GLUCOSE, CAPILLARY
Glucose-Capillary: 190 mg/dL — ABNORMAL HIGH (ref 70–99)
Glucose-Capillary: 295 mg/dL — ABNORMAL HIGH (ref 70–99)
Glucose-Capillary: 212 mg/dL — ABNORMAL HIGH (ref 70–99)
Glucose-Capillary: 231 mg/dL — ABNORMAL HIGH (ref 70–99)

## 2020-02-21 NOTE — Progress Notes (Signed)
PROGRESS NOTE  Renee [name] IRC:[id number] DOB: 13-Sep-1928 DOA: 02/12/2020 PCP: Abner Greenspan, MD  HPI/Recap of past 24 hours: HPI: Renee [name] is a 86 y.o. female with medical history significant of CVA with residual left-sided weakness, diabetes mellitus type 2, vertigo, hypertension, hyperlipidemia, CKD stage 3, and recurrent UTIs.  At baseline she ambulates with the use of a walker.  She presents with complaints of dizziness over the last 3 days.  Found to have UTI and was treated.  Stay in hospital lengthen due to waiting for SNF approval by insurance.   Assessment/Plan: Principal Problem:   Dizziness Active Problems:   Type 2 diabetes, controlled, with retinopathy (HCC)   Hemiplegia, late effect of cerebrovascular disease (HCC)   Anxiety and depression   Hypertensive urgency   Normocytic anemia    Improving dizziness with history of CVA and vertigo likely exacerbated by E. coli UTI:  Presented with complaints of dizziness with sensation of room spinning around her and feeling like she may pass out.   History of CVA and vertigo, relays no prior vestibular treatment and no prior antiemetics or vertigo medications.  CT head and MRI brain negative for any acute intracranial findings.  Assessed by PT with recommendation for SNF Continue PT OT with assistance and fall precautions TOC assisting with SNF placement  E. coli UTI Urine culture taken on 02/10/2020 grew greater than 100,000 colonies of E. coli, pansensitive. Treated with fosfomycin 1 dose every 3 days x 3 doses  Improving dehydration and poor oral intake due to nausea Continue gentle IV fluid hydration Oral intake improving, denies nausea without dyspnea Continue to encourage increase oral protein calorie intake  Resolved bradycardia, likely iatrogenic:  Presented with heart rates in the 50s.  Resume home metoprolol  Uncontrolled hypertension: Presented with SBP  greater than 180  Continue home  meds  Chronic kidney disease stage IIIA:  Baseline creatinine appears to be 1.1 with GFR of 41.   She is currently at her baseline renal function.   Continue to avoid nephrotoxins   History of CVA with residual deficits:  Patient with residual left-sided weakness unchanged. PT assessment recommendation for SNF Fall precautions in place Continue PT/OT with assistance and fall precautions  Diabetes mellitus type 2, well controlled:  Last hemoglobin A1c noted to be 6.1 on 01/05/2020.    Chronic normocytic anemia:  Presented with hemoglobin 11.1 and MCV 93 No overt bleeding Continue to monitor, transfuse with hemoglobin less than 7.0.  Dyslipidemia:  Continue Home medications include gemfibrozil 600 mg daily  Anxiety and depression -Continue Xanax and Zoloft  CODE STATUS: DNR   DVT prophylaxis: lovneox subcu daily  Consults called: None    Dispo:  Patient From: Home.  Lives alone. Home  Planned Disposition: SNF.   Expected discharge date: continue to await SNF placement    Objective: Awaiting placement to SNF. Vitals:   02/20/20 1536 02/20/20 2214 02/21/20 0523 02/21/20 0815  BP: 140/67 (!) 165/69 140/60 (!) 143/68  Pulse: (!) 56 61 (!) 57 62  Resp: 20 16 18 17   Temp: 98.2 F (36.8 C) 98.1 F (36.7 C) (!) 97.5 F (36.4 C) 98 F (36.7 C)  TempSrc: Oral Oral Oral Oral  SpO2: 96% 98% 94% 96%  Weight:      Height:        Intake/Output Summary (Last 24 hours) at 02/21/2020 1357 Last data filed at 02/21/2020 1337 Gross per 24 hour  Intake 600 ml  Output 550 ml  Net 50 ml   Filed Weights   02/16/20 0435 02/17/20 0457 02/18/20 0436  Weight: 85.5 kg 83.9 kg 83.1 kg    Exam:  . In bed, resting  Data Reviewed: CBC: No results for input(s): WBC, NEUTROABS, HGB, HCT, MCV, PLT in the last 168 hours. Basic Metabolic Panel: No results for input(s): NA, K, CL, CO2, GLUCOSE, BUN, CREATININE, CALCIUM, MG, PHOS in the last 168 hours. GFR: Estimated  Creatinine Clearance: 32.9 mL/min (A) (by C-G formula based on SCr of 1.18 mg/dL (H)). Liver Function Tests: No results for input(s): AST, ALT, ALKPHOS, BILITOT, PROT, ALBUMIN in the last 168 hours. No results for input(s): LIPASE, AMYLASE in the last 168 hours. No results for input(s): AMMONIA in the last 168 hours. Coagulation Profile: No results for input(s): INR, PROTIME in the last 168 hours. Cardiac Enzymes: No results for input(s): CKTOTAL, CKMB, CKMBINDEX, TROPONINI in the last 168 hours. BNP (last 3 results) No results for input(s): PROBNP in the last 8760 hours. HbA1C: No results for input(s): HGBA1C in the last 72 hours. CBG: Recent Labs  Lab 02/20/20 1148 02/20/20 1647 02/20/20 2211 02/21/20 0637 02/21/20 1112  GLUCAP 234* 192* 198* 190* 295*   Lipid Profile: No results for input(s): CHOL, HDL, LDLCALC, TRIG, CHOLHDL, LDLDIRECT in the last 72 hours. Thyroid Function Tests: No results for input(s): TSH, T4TOTAL, FREET4, T3FREE, THYROIDAB in the last 72 hours. Anemia Panel: No results for input(s): VITAMINB12, FOLATE, FERRITIN, TIBC, IRON, RETICCTPCT in the last 72 hours. Urine analysis:    Component Value Date/Time   COLORURINE YELLOW 02/12/2020 [place] 02/12/2020 1423   LABSPEC 1.014 02/12/2020 1423   PHURINE 6.0 02/12/2020 1423   GLUCOSEU NEGATIVE 02/12/2020 1423   HGBUR NEGATIVE 02/12/2020 1423   HGBUR negative 03/27/2010 [place] 02/12/2020 1423   BILIRUBINUR neg. 06/18/2014 [place] 02/12/2020 1423   PROTEINUR NEGATIVE 02/12/2020 1423   UROBILINOGEN 0.2 06/18/2014 1155   UROBILINOGEN 0.2 09/04/2012 1135   NITRITE NEGATIVE 02/12/2020 1423   LEUKOCYTESUR SMALL (A) 02/12/2020 1423   Sepsis Labs: @LABRCNTIP (procalcitonin:4,lacticidven:4)  ) Recent Results (from the past 240 hour(s))  SARS Coronavirus 2 by RT PCR (hospital order, performed in [place] hospital lab) Nasopharyngeal Nasopharyngeal  Swab     Status: None   Collection Time: 02/12/20  6:44 PM   Specimen: Nasopharyngeal Swab  Result Value Ref Range Status   SARS Coronavirus 2 NEGATIVE NEGATIVE Final    Comment: (NOTE) SARS-CoV-2 target nucleic acids are NOT DETECTED.  The SARS-CoV-2 RNA is generally detectable in upper and lower respiratory specimens during the acute phase of infection. The lowest concentration of SARS-CoV-2 viral copies this assay can detect is 250 copies / mL. A negative result does not preclude SARS-CoV-2 infection and should not be used as the sole basis for treatment or other patient management decisions.  A negative result may occur with improper specimen collection / handling, submission of specimen other than nasopharyngeal swab, presence of viral mutation(s) within the areas targeted by this assay, and inadequate number of viral copies (<250 copies / mL). A negative result must be combined with clinical observations, patient history, and epidemiological information.  Fact Sheet for Patients:   StrictlyIdeas.no  Fact Sheet for Healthcare Providers: BankingDealers.co.za  This test is not yet approved or  cleared by the [place] FDA and has been authorized for detection and/or diagnosis of SARS-CoV-2 by FDA under an Emergency Use Authorization (EUA).  This EUA will  remain in effect (meaning this test can be used) for the duration of the COVID-19 declaration under Section 564(b)(1) of the Act, 21 U.S.C. section 360bbb-3(b)(1), unless the authorization is terminated or revoked sooner.  Performed at [facility], [place] [address] [place], [place] [address]   SARS CORONAVIRUS 2 (TAT 6-24 HRS) Nasopharyngeal Nasopharyngeal Swab     Status: None   Collection Time: 02/15/20 11:31 PM   Specimen: Nasopharyngeal Swab  Result Value Ref Range Status   SARS Coronavirus 2 NEGATIVE NEGATIVE Final    Comment: (NOTE) SARS-CoV-2 target nucleic  acids are NOT DETECTED.  The SARS-CoV-2 RNA is generally detectable in upper and lower respiratory specimens during the acute phase of infection. Negative results do not preclude SARS-CoV-2 infection, do not rule out co-infections with other pathogens, and should not be used as the sole basis for treatment or other patient management decisions. Negative results must be combined with clinical observations, patient history, and epidemiological information. The expected result is Negative.  Fact Sheet for Patients: SugarRoll.be  Fact Sheet for Healthcare Providers: https://www.woods-mathews.com/  This test is not yet approved or cleared by the [place] FDA and  has been authorized for detection and/or diagnosis of SARS-CoV-2 by FDA under an Emergency Use Authorization (EUA). This EUA will remain  in effect (meaning this test can be used) for the duration of the COVID-19 declaration under Se ction 564(b)(1) of the Act, 21 U.S.C. section 360bbb-3(b)(1), unless the authorization is terminated or revoked sooner.  Performed at [facility], [place] [address], [place], [place] [address]       Studies: No results found.  Scheduled Meds: . ALPRAZolam  0.5 mg Oral BID  . amLODipine  10 mg Oral Daily  . aspirin  325 mg Oral Daily  . calcium-vitamin D  1 tablet Oral Daily  . docusate sodium  200 mg Oral QHS  . enoxaparin (LOVENOX) injection  40 mg Subcutaneous Q24H  . ferrous sulfate  325 mg Oral BID  . gemfibrozil  600 mg Oral Daily  . hydrALAZINE  100 mg Oral TID  . hydrocortisone cream   Topical BID  . insulin aspart  0-9 Units Subcutaneous TID WC  . metoprolol succinate  12.5 mg Oral BID  . mirtazapine  7.5 mg Oral QHS  . multivitamin with minerals  1 tablet Oral Daily  . olopatadine  1 drop Both Eyes Daily  . sertraline  50 mg Oral Daily  . sodium chloride flush  3 mL Intravenous Q12H  . trandolapril  4 mg Oral Daily  .  vitamin B-12  1,000 mcg Oral Daily    Continuous Infusions:    LOS: 8 days     [name], DO [facility]   If 7PM-7AM, please contact night-coverage [email or web address] Password [email or web address] 02/21/2020, 1:57 PM

## 2020-02-22 DIAGNOSIS — D649 Anemia, unspecified: Secondary | ICD-10-CM | POA: Diagnosis not present

## 2020-02-22 DIAGNOSIS — E11319 Type 2 diabetes mellitus with unspecified diabetic retinopathy without macular edema: Secondary | ICD-10-CM | POA: Diagnosis not present

## 2020-02-22 DIAGNOSIS — E1121 Type 2 diabetes mellitus with diabetic nephropathy: Secondary | ICD-10-CM | POA: Diagnosis not present

## 2020-02-22 DIAGNOSIS — I69354 Hemiplegia and hemiparesis following cerebral infarction affecting left non-dominant side: Secondary | ICD-10-CM | POA: Diagnosis not present

## 2020-02-22 DIAGNOSIS — M2550 Pain in unspecified joint: Secondary | ICD-10-CM | POA: Diagnosis not present

## 2020-02-22 DIAGNOSIS — Z7982 Long term (current) use of aspirin: Secondary | ICD-10-CM | POA: Diagnosis not present

## 2020-02-22 DIAGNOSIS — N1831 Chronic kidney disease, stage 3a: Secondary | ICD-10-CM | POA: Diagnosis not present

## 2020-02-22 DIAGNOSIS — R69 Illness, unspecified: Secondary | ICD-10-CM | POA: Diagnosis not present

## 2020-02-22 DIAGNOSIS — E119 Type 2 diabetes mellitus without complications: Secondary | ICD-10-CM | POA: Diagnosis not present

## 2020-02-22 DIAGNOSIS — I959 Hypotension, unspecified: Secondary | ICD-10-CM | POA: Diagnosis not present

## 2020-02-22 DIAGNOSIS — B9620 Unspecified Escherichia coli [E. coli] as the cause of diseases classified elsewhere: Secondary | ICD-10-CM | POA: Diagnosis not present

## 2020-02-22 DIAGNOSIS — G8194 Hemiplegia, unspecified affecting left nondominant side: Secondary | ICD-10-CM | POA: Diagnosis not present

## 2020-02-22 DIAGNOSIS — E1122 Type 2 diabetes mellitus with diabetic chronic kidney disease: Secondary | ICD-10-CM | POA: Diagnosis not present

## 2020-02-22 DIAGNOSIS — N390 Urinary tract infection, site not specified: Secondary | ICD-10-CM | POA: Diagnosis not present

## 2020-02-22 DIAGNOSIS — I129 Hypertensive chronic kidney disease with stage 1 through stage 4 chronic kidney disease, or unspecified chronic kidney disease: Secondary | ICD-10-CM | POA: Diagnosis not present

## 2020-02-22 DIAGNOSIS — E039 Hypothyroidism, unspecified: Secondary | ICD-10-CM | POA: Diagnosis not present

## 2020-02-22 DIAGNOSIS — Z7984 Long term (current) use of oral hypoglycemic drugs: Secondary | ICD-10-CM | POA: Diagnosis not present

## 2020-02-22 DIAGNOSIS — Z7401 Bed confinement status: Secondary | ICD-10-CM | POA: Diagnosis not present

## 2020-02-22 DIAGNOSIS — R42 Dizziness and giddiness: Secondary | ICD-10-CM | POA: Diagnosis not present

## 2020-02-22 LAB — SARS CORONAVIRUS 2 BY RT PCR (HOSPITAL ORDER, PERFORMED IN ~~LOC~~ HOSPITAL LAB): SARS Coronavirus 2: NEGATIVE

## 2020-02-22 LAB — GLUCOSE, CAPILLARY
Glucose-Capillary: 240 mg/dL — ABNORMAL HIGH (ref 70–99)
Glucose-Capillary: 246 mg/dL — ABNORMAL HIGH (ref 70–99)

## 2020-02-22 NOTE — TOC Transition Note (Signed)
Transition of Care (TOC) - CM/SW Discharge Note *Discharged to [facility] [id number] *Number for Report - [phone]   Patient Details  Name: Renee [name] MRN: [id number] Date of Birth: 02-13-1929  Transition of Care [facility]) CM/SW Contact:  Sable Feil, LCSW Phone Number: 02/22/2020, 10:40 AM   Clinical Narrative:  Patient medically stable for discharge and received call from [facility] admissions director Magda Paganini that authorization received from [place]. Doctor informed and rapid COVID test requested. Patient advised and requested that her sister Enid Derry be contacted. Call made to sister-Shirley Huffines - [phone] and informed her of patient's discharge and ambulance transport. .     Final next level of care: [place] ([place]) Barriers to Discharge: Barriers Resolved (Received insurance auth)   Patient Goals and CMS Choice Patient states their goals for this hospitalization and ongoing recovery are:: Patient agreeable to ST rehab then home CMS Medicare.gov Compare Post Acute Care list provided to:: Patient Choice offered to / list presented to : Patient  Discharge Placement   Existing PASRR number confirmed : 02/14/20          Patient [facility] bed at: [facility] Patient to be transferred to facility by: Non-emergency ambulance Name of family member notified: Francesco Runner - [phone] Patient and family notified of of transfer: 02/22/20  Discharge Plan and Services In-house Referral: Clinical Social Work                                  Social Determinants of Health ([place]) Interventions  No SDOH interventions requested or needed at discharge   Readmission Risk Interventions No flowsheet data found.

## 2020-02-22 NOTE — Discharge Summary (Signed)
Physician Discharge Summary  Talyia Allende Sirmon GGY:[id number] DOB: Dec 26, 1928 DOA: 02/12/2020  PCP: Abner Greenspan, MD  Admit date: 02/12/2020 Discharge date: 02/22/2020  Admitted From: Home Discharge disposition: SNF   Recommendations for Outpatient Follow-Up:   1. Monitor blood sugars closely   Discharge Diagnosis:   Principal Problem:   Dizziness Active Problems:   Type 2 diabetes, controlled, with retinopathy (HCC)   Hemiplegia, late effect of cerebrovascular disease (HCC)   Anxiety and depression   Hypertensive urgency   Normocytic anemia    Discharge Condition: Improved.  Diet recommendation: Low sodium, heart healthy.  Carbohydrate-modified.    Code status: DNR   History of Present Illness:   [id number]:[id number] A Busickis a 86 y.o.femalewith medical history significant ofCVA with residual left-sided weakness,diabetes mellitus type 2,vertigo, hypertension, hyperlipidemia,CKD stage 3,and recurrent UTIs.At baseline she ambulates with the use of a walker. She presents with complaints of dizziness over the last 3 days. She reports feeling as though the room is spinning around her and as if she may pass out. Symptoms only present when changing positions and resolved when laying still. Notes associated symptoms of nausea when symptoms are present. Denies having any vomiting, fever, loss of consciousness, change in appetite, or diarrhea.  Patient had been seen in the emergency department 2 days ago forthesame symptoms. CT scan revealed chronic right basal ganglia infarct without acute abnormalities. She was diagnosed with a urinary tract infection and given one-time dose of fosfomycin.She denies any complaints urinary frequency or dysuria symptoms since receiving treatment.  ED Course:Upon admission into the emergency department was pulse 52-59, blood pressures elevated up to 184/76, and all other vital signs within normal limits. Labs significant for low  11.1, BUN 15, andcreatinine 1.29.MRI of the brain was obtained and official read pending. Patient had been given 500 mL of normal saline IV fluids, 25 mg of meclizine, and Tylenol.    Hospital Course by Problem:   dizziness with history of CVA and vertigo likely exacerbated by E. coli UTI:  Presented with complaints of dizziness with sensation of room spinning around her and feeling like she may pass out.   History of CVA and vertigo, relays no prior vestibular treatment and no prior antiemetics or vertigo medications.  CT head and MRI brain negative for any acute intracranial findings.  Assessed by PT with recommendation for SNF Continue PT OT with assistance and fall precautions TOC assisting with SNF placement  E. coli UTI Urine culture taken on 02/10/2020 grew greater than 100,000 colonies of E. coli, pansensitive. Treated with fosfomycin 1 dose every 3 days x 3 doses   dehydration and poor oral intake due to nausea -Resolved  Resolved bradycardia, likely iatrogenic:  Presented with heart rates in the 50s.  Resume home metoprolol  Uncontrolled hypertension: Presented with SBP greater than 180  Continue home meds  Chronic kidney disease stage IIIA:  Baseline creatinine appears to be 1.1 with GFR of 41.   She is currently at her baseline renal function.   Continue to avoid nephrotoxins   History of CVA with residual deficits:  Patient with residual left-sided weakness unchanged. PT assessment recommendation for SNF Fall precautions in place Continue PT/OT with assistance and fall precautions  Diabetes mellitus type 2, well controlled:  Last hemoglobin A1c noted to be 6.1 on 01/05/2020.   Chronic normocytic anemia:  Presented with hemoglobin 11.1 and MCV 93 No overt bleeding Continue to monitor, transfuse with hemoglobin less than 7.0.  Dyslipidemia: Continue  Home medications includegemfibrozil 600 mg daily  Anxiety and depression -Continue Xanax and  Zoloft    Medical Consultants:      Discharge Exam:   Vitals:   02/22/20 0432 02/22/20 0840  BP: (!) 145/55 (!) 148/60  Pulse: 63 66  Resp: 18 18  Temp: 97.8 F (36.6 C) 97.9 F (36.6 C)  SpO2: 92% 94%   Vitals:   02/21/20 1614 02/21/20 2042 02/22/20 0432 02/22/20 0840  BP: 135/79 (!) 165/58 (!) 145/55 (!) 148/60  Pulse: 78 64 63 66  Resp: 18 18 18 18   Temp: 97.8 F (36.6 C) 98 F (36.7 C) 97.8 F (36.6 C) 97.9 F (36.6 C)  TempSrc: Oral Oral Oral Oral  SpO2: 95% 96% 92% 94%  Weight:      Height:        General exam: Appears calm and comfortable.  The results of significant diagnostics from this hospitalization (including imaging, microbiology, ancillary and laboratory) are listed below for reference.     Procedures and Diagnostic Studies:   MR BRAIN WO CONTRAST  Result Date: 02/12/2020 CLINICAL DATA:  Dizziness. History of stroke with residual left hemiparesis. EXAM: MRI HEAD WITHOUT CONTRAST TECHNIQUE: Multiplanar, multiecho pulse sequences of the brain and surrounding structures were obtained without intravenous contrast. COMPARISON:  Head CT 02/10/2020 and MRI 06/02/2010 FINDINGS: Brain: No acute infarct, mass, midline shift, or extra-axial fluid collection is identified. A chronic infarct is again noted involving the right basal ganglia and corona radiata with associated chronic blood products and ex vacuo dilatation of the body of the right lateral ventricle. Small T2 hyperintensities elsewhere in the cerebral white matter bilaterally have mildly progressed from the prior MRI and are nonspecific but compatible with mild chronic small vessel ischemic disease. There is mild cerebral atrophy. Vascular: Major intracranial vascular flow voids are preserved. Skull and upper cervical spine: Unremarkable bone marrow signal. Sinuses/Orbits: Bilateral cataract extraction. Right scleral buckle. Clear paranasal sinuses. Left mastoid and middle ear effusions, also present in  2011. Other: None. IMPRESSION: 1. No acute intracranial abnormality. 2. Mild chronic small vessel ischemic disease. 3. Chronic right basal ganglia and deep white matter infarct. 4. Chronic or recurrent left mastoid and middle ear effusions. Electronically Signed   By: Logan Bores M.D.   On: 02/12/2020 16:17     Labs:   Basic Metabolic Panel: No results for input(s): NA, K, CL, CO2, GLUCOSE, BUN, CREATININE, CALCIUM, MG, PHOS in the last 168 hours. GFR Estimated Creatinine Clearance: 32.9 mL/min (A) (by C-G formula based on SCr of 1.18 mg/dL (H)). Liver Function Tests: No results for input(s): AST, ALT, ALKPHOS, BILITOT, PROT, ALBUMIN in the last 168 hours. No results for input(s): LIPASE, AMYLASE in the last 168 hours. No results for input(s): AMMONIA in the last 168 hours. Coagulation profile No results for input(s): INR, PROTIME in the last 168 hours.  CBC: No results for input(s): WBC, NEUTROABS, HGB, HCT, MCV, PLT in the last 168 hours. Cardiac Enzymes: No results for input(s): CKTOTAL, CKMB, CKMBINDEX, TROPONINI in the last 168 hours. BNP: Invalid input(s): POCBNP CBG: Recent Labs  Lab 02/21/20 0637 02/21/20 1112 02/21/20 1617 02/21/20 2041 02/22/20 0647  GLUCAP 190* 295* 212* 231* 240*   D-Dimer No results for input(s): DDIMER in the last 72 hours. Hgb A1c No results for input(s): HGBA1C in the last 72 hours. Lipid Profile No results for input(s): CHOL, HDL, LDLCALC, TRIG, CHOLHDL, LDLDIRECT in the last 72 hours. Thyroid function studies No results for input(s): TSH, T4TOTAL,  T3FREE, THYROIDAB in the last 72 hours.  Invalid input(s): FREET3 Anemia work up No results for input(s): VITAMINB12, FOLATE, FERRITIN, TIBC, IRON, RETICCTPCT in the last 72 hours. Microbiology Recent Results (from the past 240 hour(s))  SARS Coronavirus 2 by RT PCR (hospital order, performed in [facility] hospital lab) Nasopharyngeal Nasopharyngeal Swab     Status: None   Collection Time:  02/12/20  6:44 PM   Specimen: Nasopharyngeal Swab  Result Value Ref Range Status   SARS Coronavirus 2 NEGATIVE NEGATIVE Final    Comment: (NOTE) SARS-CoV-2 target nucleic acids are NOT DETECTED.  The SARS-CoV-2 RNA is generally detectable in upper and lower respiratory specimens during the acute phase of infection. The lowest concentration of SARS-CoV-2 viral copies this assay can detect is 250 copies / mL. A negative result does not preclude SARS-CoV-2 infection and should not be used as the sole basis for treatment or other patient management decisions.  A negative result may occur with improper specimen collection / handling, submission of specimen other than nasopharyngeal swab, presence of viral mutation(s) within the areas targeted by this assay, and inadequate number of viral copies (<250 copies / mL). A negative result must be combined with clinical observations, patient history, and epidemiological information.  Fact Sheet for Patients:   StrictlyIdeas.no  Fact Sheet for Healthcare Providers: BankingDealers.co.za  This test is not yet approved or  cleared by the [place] FDA and has been authorized for detection and/or diagnosis of SARS-CoV-2 by FDA under an Emergency Use Authorization (EUA).  This EUA will remain in effect (meaning this test can be used) for the duration of the COVID-19 declaration under Section 564(b)(1) of the Act, 21 U.S.C. section 360bbb-3(b)(1), unless the authorization is terminated or revoked sooner.  Performed at [facility], [place] [address] [place], [place] [address]   SARS CORONAVIRUS 2 (TAT 6-24 HRS) Nasopharyngeal Nasopharyngeal Swab     Status: None   Collection Time: 02/15/20 11:31 PM   Specimen: Nasopharyngeal Swab  Result Value Ref Range Status   SARS Coronavirus 2 NEGATIVE NEGATIVE Final    Comment: (NOTE) SARS-CoV-2 target nucleic acids are NOT DETECTED.  The SARS-CoV-2  RNA is generally detectable in upper and lower respiratory specimens during the acute phase of infection. Negative results do not preclude SARS-CoV-2 infection, do not rule out co-infections with other pathogens, and should not be used as the sole basis for treatment or other patient management decisions. Negative results must be combined with clinical observations, patient history, and epidemiological information. The expected result is Negative.  Fact Sheet for Patients: SugarRoll.be  Fact Sheet for Healthcare Providers: https://www.woods-mathews.com/  This test is not yet approved or cleared by the [place] FDA and  has been authorized for detection and/or diagnosis of SARS-CoV-2 by FDA under an Emergency Use Authorization (EUA). This EUA will remain  in effect (meaning this test can be used) for the duration of the COVID-19 declaration under Se ction 564(b)(1) of the Act, 21 U.S.C. section 360bbb-3(b)(1), unless the authorization is terminated or revoked sooner.  Performed at [facility], [place] [address], [place], [place] [address]      Discharge Instructions:    Allergies as of 02/22/2020      Reactions   Diclofenac Sodium Other (See Comments)   REACTION: angioedema   Pioglitazone Swelling      Medication List    STOP taking these medications   REFRESH OP   valACYclovir 500 MG tablet Commonly known as: Valtrex  TAKE these medications   acetaminophen 325 MG tablet Commonly known as: TYLENOL Take 650 mg by mouth every 6 (six) hours as needed.   albuterol 108 (90 Base) MCG/ACT inhaler Commonly known as: VENTOLIN HFA Inhale 2 puffs into the lungs every 4 (four) hours as needed for wheezing or shortness of breath.   ALPRAZolam 0.5 MG tablet Commonly known as: XANAX TAKE 1/2 TO 1 TABLET BY MOUTH TWICE A DAY AS NEEDED FOR ANXIETY What changed: See the new instructions.   amLODipine 10 MG tablet Commonly  known as: NORVASC Take 1 tablet (10 mg total) by mouth daily.   aspirin 325 MG tablet Take 325 mg by mouth daily.   calcium-vitamin D 500-200 MG-UNIT tablet Commonly known as: OSCAL WITH D Take 1 tablet by mouth daily.   docusate sodium 100 MG capsule Commonly known as: COLACE Take 200 mg by mouth at bedtime.   ferrous sulfate 325 (65 FE) MG tablet Take 325 mg by mouth 2 (two) times daily.   gemfibrozil 600 MG tablet Commonly known as: LOPID TAKE 1 TABLET BY MOUTH EVERY DAY   glipiZIDE 2.5 MG 24 hr tablet Commonly known as: GLUCOTROL XL TAKE 1 TABLET BY MOUTH EVERY DAY WITH BREAKFAST What changed: See the new instructions.   hydrALAZINE 100 MG tablet Commonly known as: APRESOLINE Take 100 mg by mouth 3 (three) times daily.   metoprolol succinate 25 MG 24 hr tablet Commonly known as: TOPROL-XL Take 0.5 tablets (12.5 mg total) by mouth 2 (two) times daily.   mirtazapine 15 MG tablet Commonly known as: REMERON TAKE 1/2 TABLET BY MOUTH AT BEDTIME   multivitamin capsule Take 1 capsule by mouth daily.   ONE TOUCH ULTRA 2 w/Device Kit Check blood sugar once daily and as directed. Dx J19.1   OneTouch Delica Lancets 47W Misc CHECK BLOOD SUGAR ONCE DAILY AND AS DIRECTED. DX E11.9 What changed: See the new instructions.   OneTouch Ultra test strip Generic drug: glucose blood CHECK BLOOD SUGAR ONCE DAILY AND AS DIRECTED. DX E11.9   PATADAY OP Place 1 drop into both eyes daily.   sertraline 50 MG tablet Commonly known as: ZOLOFT TAKE 1 TABLET BY MOUTH EVERY DAY   trandolapril 4 MG tablet Commonly known as: MAVIK Take 1 tablet (4 mg total) by mouth daily.   vitamin B-12 1000 MCG tablet Commonly known as: CYANOCOBALAMIN Take 1,000 mcg by mouth daily.   Vitamin D 1000 units capsule Take 1,000 Units by mouth daily.            Durable Medical Equipment  (From admission, onward)         Start     Ordered   02/15/20 0957  For home use only DME 3 n 1  Once         02/15/20 2956          Follow-up Information    Tower, Wynelle Fanny, MD. Call in 1 day(s).   Specialties: Family Medicine, Radiology Why: please call for a post hospital follow up appointment Contact information: [place] [address] [phone]                Time coordinating discharge: 7 days  Signed:  [name] DO  [facility] 02/22/2020, 9:41 AM

## 2020-02-23 DIAGNOSIS — E1121 Type 2 diabetes mellitus with diabetic nephropathy: Secondary | ICD-10-CM | POA: Diagnosis not present

## 2020-02-23 DIAGNOSIS — N1831 Chronic kidney disease, stage 3a: Secondary | ICD-10-CM | POA: Diagnosis not present

## 2020-02-23 DIAGNOSIS — R69 Illness, unspecified: Secondary | ICD-10-CM | POA: Diagnosis not present

## 2020-02-23 DIAGNOSIS — N390 Urinary tract infection, site not specified: Secondary | ICD-10-CM | POA: Diagnosis not present

## 2020-02-23 DIAGNOSIS — G8194 Hemiplegia, unspecified affecting left nondominant side: Secondary | ICD-10-CM | POA: Diagnosis not present

## 2020-02-23 DIAGNOSIS — B9620 Unspecified Escherichia coli [E. coli] as the cause of diseases classified elsewhere: Secondary | ICD-10-CM | POA: Diagnosis not present

## 2020-03-08 ENCOUNTER — Other Ambulatory Visit: Payer: Self-pay | Admitting: Family Medicine

## 2020-03-09 DIAGNOSIS — Z7982 Long term (current) use of aspirin: Secondary | ICD-10-CM | POA: Diagnosis not present

## 2020-03-09 DIAGNOSIS — Z87440 Personal history of urinary (tract) infections: Secondary | ICD-10-CM | POA: Diagnosis not present

## 2020-03-09 DIAGNOSIS — E11319 Type 2 diabetes mellitus with unspecified diabetic retinopathy without macular edema: Secondary | ICD-10-CM | POA: Diagnosis not present

## 2020-03-09 DIAGNOSIS — I69354 Hemiplegia and hemiparesis following cerebral infarction affecting left non-dominant side: Secondary | ICD-10-CM | POA: Diagnosis not present

## 2020-03-09 DIAGNOSIS — N183 Chronic kidney disease, stage 3 unspecified: Secondary | ICD-10-CM | POA: Diagnosis not present

## 2020-03-09 DIAGNOSIS — I129 Hypertensive chronic kidney disease with stage 1 through stage 4 chronic kidney disease, or unspecified chronic kidney disease: Secondary | ICD-10-CM | POA: Diagnosis not present

## 2020-03-09 DIAGNOSIS — M1990 Unspecified osteoarthritis, unspecified site: Secondary | ICD-10-CM | POA: Diagnosis not present

## 2020-03-09 DIAGNOSIS — R69 Illness, unspecified: Secondary | ICD-10-CM | POA: Diagnosis not present

## 2020-03-09 DIAGNOSIS — Z9181 History of falling: Secondary | ICD-10-CM | POA: Diagnosis not present

## 2020-03-09 NOTE — Telephone Encounter (Signed)
Valtrex not on med list.  Name of Medication: xanax Name of Pharmacy: [facility] or Written Date and Quantity: PCP filled on 01/19/20 #60 tabs (hospital gave #4 tabs on 02/15/20) 0 refills Last Office Visit and Type: 6 mo f/u on 01/05/20 Next Office Visit and Type: Hospital/ Rehab f/u on 03/11/20

## 2020-03-09 NOTE — Telephone Encounter (Signed)
Pt said that the valtrex is an auto refill and she isn't taking it and she doesn't need it. Rx declined and routed message back to Dr. Glori Bickers to fill

## 2020-03-09 NOTE — Telephone Encounter (Signed)
I do not want to keep refilling valtrex unless she develops an actual rash (I'm afraid we are giving it too often)  If she has a shingles rash please let me know   Send this back and I will refill the xanax

## 2020-03-10 ENCOUNTER — Telehealth: Payer: Self-pay

## 2020-03-10 DIAGNOSIS — Z7982 Long term (current) use of aspirin: Secondary | ICD-10-CM | POA: Diagnosis not present

## 2020-03-10 DIAGNOSIS — I129 Hypertensive chronic kidney disease with stage 1 through stage 4 chronic kidney disease, or unspecified chronic kidney disease: Secondary | ICD-10-CM | POA: Diagnosis not present

## 2020-03-10 DIAGNOSIS — R69 Illness, unspecified: Secondary | ICD-10-CM | POA: Diagnosis not present

## 2020-03-10 DIAGNOSIS — E11319 Type 2 diabetes mellitus with unspecified diabetic retinopathy without macular edema: Secondary | ICD-10-CM | POA: Diagnosis not present

## 2020-03-10 DIAGNOSIS — I69354 Hemiplegia and hemiparesis following cerebral infarction affecting left non-dominant side: Secondary | ICD-10-CM | POA: Diagnosis not present

## 2020-03-10 DIAGNOSIS — M1990 Unspecified osteoarthritis, unspecified site: Secondary | ICD-10-CM | POA: Diagnosis not present

## 2020-03-10 DIAGNOSIS — Z9181 History of falling: Secondary | ICD-10-CM | POA: Diagnosis not present

## 2020-03-10 DIAGNOSIS — N183 Chronic kidney disease, stage 3 unspecified: Secondary | ICD-10-CM | POA: Diagnosis not present

## 2020-03-10 DIAGNOSIS — Z87440 Personal history of urinary (tract) infections: Secondary | ICD-10-CM | POA: Diagnosis not present

## 2020-03-10 NOTE — Telephone Encounter (Signed)
Verbal order left on VM  

## 2020-03-10 NOTE — Telephone Encounter (Signed)
Debbie with [facility] called asking for verbal orders for OT Evaluation. Can leave order on VM [phone]. Pt has appt here tomorrow.

## 2020-03-10 NOTE — Telephone Encounter (Signed)
Please ok the verbal order for OT

## 2020-03-11 ENCOUNTER — Ambulatory Visit (INDEPENDENT_AMBULATORY_CARE_PROVIDER_SITE_OTHER): Payer: Medicare HMO | Admitting: Family Medicine

## 2020-03-11 ENCOUNTER — Other Ambulatory Visit: Payer: Self-pay

## 2020-03-11 ENCOUNTER — Encounter: Payer: Self-pay | Admitting: Family Medicine

## 2020-03-11 VITALS — BP 136/60 | HR 65 | Temp 97.5°F | Ht 61.0 in | Wt 182.6 lb

## 2020-03-11 DIAGNOSIS — E11319 Type 2 diabetes mellitus with unspecified diabetic retinopathy without macular edema: Secondary | ICD-10-CM | POA: Diagnosis not present

## 2020-03-11 DIAGNOSIS — D649 Anemia, unspecified: Secondary | ICD-10-CM

## 2020-03-11 DIAGNOSIS — I160 Hypertensive urgency: Secondary | ICD-10-CM | POA: Diagnosis not present

## 2020-03-11 DIAGNOSIS — R42 Dizziness and giddiness: Secondary | ICD-10-CM | POA: Diagnosis not present

## 2020-03-11 DIAGNOSIS — I10 Essential (primary) hypertension: Secondary | ICD-10-CM

## 2020-03-11 DIAGNOSIS — N184 Chronic kidney disease, stage 4 (severe): Secondary | ICD-10-CM

## 2020-03-11 NOTE — Progress Notes (Signed)
Subjective:    Patient ID: Renee [name], female    DOB: 27-Feb-1929, 86 y.o.   MRN: [id number]  This visit occurred during the SARS-CoV-2 public health emergency.  Safety protocols were in place, including screening questions prior to the visit, additional usage of staff PPE, and extensive cleaning of exam room while observing appropriate contact time as indicated for disinfecting solutions.    HPI Pt presents for f/u of hospitalization from 6/25 to 02/22/20 for chief c/o dizziness  She presented to there ER several days before with vertigo type symptoms and was tx for uti with a dose of fosfomycin   She returned several days later and was admitted for positional dizziness with feeling of pre syncope  Hospital course as follows:  Hospital Course by Problem:   dizziness with history of CVA and vertigo likely exacerbated by E. coli UTI:  Presented with complaints of dizziness with sensation of room spinning around her and feeling like she may pass out.  History of CVA and vertigo, relays no prior vestibular treatment and no prior antiemetics or vertigo medications.  CT head and MRI brain negative for any acute intracranial findings.  Assessed by PT with recommendation for SNF Continue PT OT with assistance and fall precautions TOC assisting with SNF placement  E. coli UTI Urine culture taken on 02/10/2020 grew greater than 100,000 colonies of E. coli, pansensitive. Treated with fosfomycin 1 dose every 3 days x 3 doses   dehydration and poor oral intake due to nausea -Resolved  Resolved bradycardia, likely iatrogenic:  Presented with heart rates in the 50s.  Resume home metoprolol  Uncontrolled hypertension: Presented with SBPgreater than 180  Continue home meds  Chronic kidney disease stage IIIA:  Baseline creatinine appears to be 1.1 with GFR of 41.  She is currently at her baseline renal function.  Continue to avoid nephrotoxins   History of CVA with residual  deficits:  Patient with residual left-sided weakness unchanged. PT assessment recommendation for SNF Fall precautions in place Continue PT/OT with assistance and fall precautions  Diabetes mellitus type 2, well controlled:  Last hemoglobin A1c noted to be 6.1 on 01/05/2020.   Chronic normocytic anemia:  Presented with hemoglobin 11.1 and MCV 93 No overt bleeding Continue to monitor, transfuse with hemoglobin less than 7.0.  Dyslipidemia: Continue Home medications includegemfibrozil 600 mg daily  Anxiety and depression -Continue Xanax and Zoloft  MRI report: MR BRAIN WO CONTRAST  Result Date: 02/12/2020 CLINICAL DATA:  Dizziness. History of stroke with residual left hemiparesis. EXAM: MRI HEAD WITHOUT CONTRAST TECHNIQUE: Multiplanar, multiecho pulse sequences of the brain and surrounding structures were obtained without intravenous contrast. COMPARISON:  Head CT 02/10/2020 and MRI 06/02/2010 FINDINGS: Brain: No acute infarct, mass, midline shift, or extra-axial fluid collection is identified. A chronic infarct is again noted involving the right basal ganglia and corona radiata with associated chronic blood products and ex vacuo dilatation of the body of the right lateral ventricle. Small T2 hyperintensities elsewhere in the cerebral white matter bilaterally have mildly progressed from the prior MRI and are nonspecific but compatible with mild chronic small vessel ischemic disease. There is mild cerebral atrophy. Vascular: Major intracranial vascular flow voids are preserved. Skull and upper cervical spine: Unremarkable bone marrow signal. Sinuses/Orbits: Bilateral cataract extraction. Right scleral buckle. Clear paranasal sinuses. Left mastoid and middle ear effusions, also present in 2011. Other: None. IMPRESSION: 1. No acute intracranial abnormality. 2. Mild chronic small vessel ischemic disease. 3. Chronic right basal ganglia and  deep white matter infarct. 4. Chronic or recurrent left  mastoid and middle ear effusions. Electronically Signed   By: Logan Bores M.D.   On: 02/12/2020 16:17   Ears-no symptoms at all    Last labs Lab Results  Component Value Date   CREATININE 1.18 (H) 02/13/2020   BUN 15 02/13/2020   NA 139 02/13/2020   K 4.0 02/13/2020   CL 104 02/13/2020   CO2 25 02/13/2020   Lab Results  Component Value Date   ALT 10 01/05/2020   AST 17 01/05/2020   ALKPHOS 77 01/05/2020   BILITOT 0.4 01/05/2020   Lab Results  Component Value Date   WBC 4.3 02/13/2020   HGB 10.8 (L) 02/13/2020   HCT 35.2 (L) 02/13/2020   MCV 92.4 02/13/2020   PLT 161 02/13/2020  she had neg covid test as well   Normocytic anemia- Hb down after hydration   She has been immunized for covid   Today: Wt Readings from Last 3 Encounters:  03/11/20 182 lb 9 oz (82.8 kg)  02/18/20 183 lb 3.2 oz (83.1 kg)  02/10/20 180 lb (81.6 kg)   34.49 kg/m  Now home from covid and PT is visiting    HTN bp is stable today  No cp or palpitations or headaches or edema  No side effects to medicines  BP Readings from Last 3 Encounters:  03/11/20 (!) 148/74  02/22/20 (!) 148/60  02/10/20 (!) 187/89     Pulse Readings from Last 3 Encounters:  03/11/20 65  02/22/20 66  02/10/20 69    Only dizzy when she first gets up in the am  No nausea   Last cholesterol Lab Results  Component Value Date   CHOL 197 01/05/2020   HDL 35.00 (L) 01/05/2020   LDLCALC 122 (H) 01/05/2020   LDLDIRECT 96.0 09/30/2018   TRIG 200.0 (H) 01/05/2020   CHOLHDL 6 01/05/2020   Has moved her cholesterol medicine at night   Her strength is pretty good  OT is to be started for arm strength- household tasks   Appetite is good  Glucose is stable   Normocytic anemia Lab Results  Component Value Date   WBC 4.3 02/13/2020   HGB 10.8 (L) 02/13/2020   HCT 35.2 (L) 02/13/2020   MCV 92.4 02/13/2020   PLT 161 02/13/2020   Patient Active Problem List   Diagnosis Date Noted  . Dizziness 02/12/2020    . Normocytic anemia 02/12/2020  . Fall involving sidewalk curb 11/29/2019  . Contusion of back 11/27/2019  . Hyperlipidemia associated with type 2 diabetes mellitus ([place])   . Aortic atherosclerosis ([place]) 07/13/2019  . H/O sepsis 07/01/2019  . CKD (chronic kidney disease) stage 4, GFR 15-29 ml/min (H[facility]) 07/01/2019  . Lower abdominal pain 05/11/2019  . Routine general medical examination at a health care facility 03/30/2019  . External hemorrhoid 01/17/2018  . History of colitis 01/17/2018  . Generalized weakness 12/24/2016  . Diabetic retinopathy ([place]) 12/11/2016  . History of CVA (cerebrovascular accident) 12/11/2016  . Estrogen deficiency 08/30/2015  . Encounter for Medicare annual wellness exam 04/17/2013  . Chest wall pain 04/07/2013  . Mobility impaired 06/26/2011  . History of retinal detachment 01/10/2011  . Sleep apnea 11/28/2010  . Anxiety and depression 08/25/2010  . Hemiplegia, late effect of cerebrovascular disease ([place]) 07/05/2010  . POSTHERPETIC NEURALGIA 11/09/2009  . Renal insufficiency 06/29/2008  . Chronic back pain 01/26/2008  . EDEMA 01/26/2008  . B12 deficiency 01/10/2007  . Type  2 diabetes, controlled, with retinopathy ([place]) 11/27/2006  . Essential hypertension 11/27/2006  . FIBROCYSTIC BREAST DISEASE 11/27/2006  . ROSACEA 11/27/2006  . OSTEOARTHRITIS 11/27/2006  . URINARY INCONTINENCE, MIXED 11/27/2006   Past Medical History:  Diagnosis Date  . Angioedema    possibly from voltaren  . Bronchopneumonia 12/11/2016  . Degenerative disc disease   . Diabetes mellitus    type II  . Hyperlipidemia   . Hypertension   . LVH (left ventricular hypertrophy)    and atrial enlargement by echo in past with nl EF  . Nasal pruritis   . Osteoarthritis   . Osteopenia   . Renal insufficiency   . Sleep apnea   . Stroke [facility]) 05/2010   Small vessel sobcortical (in [place] trial) with Dr Leonie Man, residual L hemiparesis  . Vitamin B 12 deficiency 04/08   Past  Surgical History:  Procedure Laterality Date  . ABDOMINAL HYSTERECTOMY     BSO-fibroids  . APPENDECTOMY    . BACK SURGERY    . COLON SURGERY     due to punctured intestines  . EYE SURGERY     cataract extraction  . KNEE SURGERY     arthroscope  . PARS PLANA VITRECTOMY  07/31/2011   Procedure: PARS PLANA VITRECTOMY WITH 25 GAUGE;  Surgeon: [name], MD;  Location: [facility];  Service: Ophthalmology;  Laterality: Right;  REMOVAL OF SILICONE OIL AND LASER RIGHT EYE  . RETINAL DETACHMENT SURGERY  02/18/11   times 2  . SPINE SURGERY  08/09   spinal decompression surgery   Social History   Tobacco Use  . Smoking status: Never Smoker  . Smokeless tobacco: Never Used  Vaping Use  . Vaping Use: Never assessed  Substance Use Topics  . Alcohol use: No    Alcohol/week: 0.0 standard drinks  . Drug use: No   Family History  Problem Relation Age of Onset  . COPD Brother   . Cancer Sister        brain tumor with hemmorhage  . Heart disease Sister        CAD   Allergies  Allergen Reactions  . Diclofenac Sodium Other (See Comments)    REACTION: angioedema  . Pioglitazone Swelling   Current Outpatient Medications on File Prior to Visit  Medication Sig Dispense Refill  . acetaminophen (TYLENOL) 325 MG tablet Take 650 mg by mouth every 6 (six) hours as needed.    Marland Kitchen albuterol (VENTOLIN HFA) 108 (90 Base) MCG/ACT inhaler Inhale 2 puffs into the lungs every 4 (four) hours as needed for wheezing or shortness of breath. 18 g 3  . ALPRAZolam (XANAX) 0.5 MG tablet TAKE 1/2 TO 1 TABLET BY MOUTH TWICE A DAY AS NEEDED FOR ANXIETY 60 tablet 0  . amLODipine (NORVASC) 10 MG tablet Take 1 tablet (10 mg total) by mouth daily. 90 tablet 3  . aspirin 325 MG tablet Take 325 mg by mouth daily.     . Blood Glucose Monitoring Suppl (ONE TOUCH ULTRA 2) w/Device KIT Check blood sugar once daily and as directed. Dx E11.9 1 each 0  . calcium-vitamin D (OSCAL WITH D) 500-200 MG-UNIT per tablet Take 1 tablet  by mouth daily.     . Cholecalciferol (VITAMIN D) 1000 UNITS capsule Take 1,000 Units by mouth daily.     Marland Kitchen docusate sodium (COLACE) 100 MG capsule Take 200 mg by mouth at bedtime.    . ferrous sulfate 325 (65 FE) MG tablet Take 325 mg by  mouth 2 (two) times daily.    Marland Kitchen gemfibrozil (LOPID) 600 MG tablet TAKE 1 TABLET BY MOUTH EVERY DAY (Patient taking differently: Take 600 mg by mouth daily. ) 90 tablet 2  . glipiZIDE (GLUCOTROL XL) 2.5 MG 24 hr tablet TAKE 1 TABLET BY MOUTH EVERY DAY WITH BREAKFAST (Patient taking differently: Take 2.5 mg by mouth daily with breakfast. ) 90 tablet 3  . hydrALAZINE (APRESOLINE) 100 MG tablet Take 100 mg by mouth 3 (three) times daily.     . metoprolol succinate (TOPROL-XL) 25 MG 24 hr tablet Take 0.5 tablets (12.5 mg total) by mouth 2 (two) times daily. 90 tablet 3  . mirtazapine (REMERON) 15 MG tablet TAKE 1/2 TABLET BY MOUTH AT BEDTIME (Patient taking differently: Take 7.5 mg by mouth at bedtime. ) 45 tablet 3  . Multiple Vitamin (MULTIVITAMIN) capsule Take 1 capsule by mouth daily.     . Olopatadine HCl (PATADAY OP) Place 1 drop into both eyes daily.    Glory Rosebush Delica Lancets 16X MISC CHECK BLOOD SUGAR ONCE DAILY AND AS DIRECTED. DX E11.9 (Patient taking differently: 1 Device by Other route daily. ) 100 each 2  . ONETOUCH ULTRA test strip CHECK BLOOD SUGAR ONCE DAILY AND AS DIRECTED. DX E11.9 100 strip 1  . sertraline (ZOLOFT) 50 MG tablet TAKE 1 TABLET BY MOUTH EVERY DAY (Patient taking differently: Take 50 mg by mouth daily. ) 90 tablet 2  . trandolapril (MAVIK) 4 MG tablet Take 1 tablet (4 mg total) by mouth daily. 90 tablet 3  . vitamin B-12 (CYANOCOBALAMIN) 1000 MCG tablet Take 1,000 mcg by mouth daily.      No current facility-administered medications on file prior to visit.      Review of Systems  Constitutional: Positive for fatigue. Negative for activity change, appetite change, fever and unexpected weight change.  HENT: Negative for congestion,  ear pain, rhinorrhea, sinus pressure and sore throat.   Eyes: Negative for pain, redness and visual disturbance.  Respiratory: Negative for cough, shortness of breath and wheezing.   Cardiovascular: Negative for chest pain and palpitations.  Gastrointestinal: Negative for abdominal pain, blood in stool, constipation and diarrhea.  Endocrine: Negative for polydipsia and polyuria.  Genitourinary: Negative for dysuria, frequency and urgency.  Musculoskeletal: Negative for arthralgias, back pain and myalgias.  Skin: Negative for color change, pallor and rash.  Allergic/Immunologic: Negative for environmental allergies.  Neurological: Positive for dizziness and weakness. Negative for syncope, light-headedness, numbness and headaches.       No change in baseline hemiparesis   Hematological: Negative for adenopathy. Does not bruise/bleed easily.  Psychiatric/Behavioral: Negative for decreased concentration and dysphoric mood. The patient is not nervous/anxious.        Objective:   Physical Exam Constitutional:      General: She is not in acute distress.    Appearance: Normal appearance. She is well-developed. She is obese. She is not ill-appearing or diaphoretic.  HENT:     Head: Normocephalic and atraumatic.     Right Ear: Tympanic membrane and ear canal normal. There is no impacted cerumen.     Left Ear: Tympanic membrane and ear canal normal. There is no impacted cerumen.     Nose: Nose normal.     Mouth/Throat:     Mouth: Mucous membranes are moist.  Eyes:     Conjunctiva/sclera: Conjunctivae normal.     Pupils: Pupils are equal, round, and reactive to light.     Comments: No nystagmus   Neck:  Thyroid: No thyromegaly.     Vascular: No carotid bruit or JVD.  Cardiovascular:     Rate and Rhythm: Normal rate and regular rhythm.     Heart sounds: Normal heart sounds. No gallop.   Pulmonary:     Effort: Pulmonary effort is normal. No respiratory distress.     Breath sounds:  Normal breath sounds. No wheezing or rales.  Abdominal:     General: Bowel sounds are normal. There is no distension or abdominal bruit.     Palpations: Abdomen is soft. There is no mass.     Tenderness: There is no abdominal tenderness.  Musculoskeletal:     Cervical back: Normal range of motion and neck supple.     Right lower leg: No edema.     Left lower leg: No edema.  Lymphadenopathy:     Cervical: No cervical adenopathy.  Skin:    General: Skin is warm and dry.     Coloration: Skin is not pale.     Findings: No erythema or rash.  Neurological:     Mental Status: She is alert.     Sensory: No sensory deficit.     Coordination: Coordination normal.     Deep Tendon Reflexes: Reflexes are normal and symmetric. Reflexes normal.     Comments: No changes in residual L hemiparesis from old CVA  Psychiatric:        Mood and Affect: Mood normal.        Cognition and Memory: Cognition and memory normal.     Comments: Speech is slow from past cva-no changes  pleasant           Assessment & Plan:   Problem List Items Addressed This Visit      Cardiovascular and Mediastinum   Essential hypertension    bp in fair control at this time  BP Readings from Last 1 Encounters:  03/11/20 (!) 136/60   No changes needed Most recent labs reviewed  Disc lifstyle change with low sodium diet and exercise  Better on 2nd check after sitting Hypertensive urgency from hospital quickly resolved      RESOLVED: Hypertensive urgency    Noted at admit for dizziness Resolved soon after        Endocrine   Type 2 diabetes, controlled, with retinopathy ([place])    Stable Lab Results  Component Value Date   HGBA1C 6.1 01/05/2020   Glucose readings at home are ok        Genitourinary   CKD (chronic kidney disease) stage 4, GFR 15-29 ml/min (HCC)    Cr went up due to dehydration when pt was hosp for vertiginous dizziness This corrected to her baseline soon after Reviewed hospital  records, lab results and studies in detail  Will f/u with her nephrologist as planned        Other   Dizziness - Primary    Hospitalized with positional/vertiginous dizziness in the setting of uti and dehydration  CT and MRI of head were reassuring Chronic or recurrent L mastoid/middle ear effusion noted on imaging- ? If related to dizziness Reviewed hospital records, lab results and studies in detail   inst pt that we would consider ENT visit if dizziness returns Nl exam today  PT/OT continues at home after successful SNF stay       Normocytic anemia    No significant changes- Hb slt lower after hydration in hospital Reviewed hospital records, lab results and studies in detail  She will continue nephroology  f/u

## 2020-03-11 NOTE — Patient Instructions (Addendum)
Make sure to re schedule your kidney doctor appt.   Please work on hydrating  64 oz of fluids daily -- mostly water  Avoid sugar beverages    Let me know if dizziness comes back  Change position slowly  Continue PT and OT

## 2020-03-13 NOTE — Assessment & Plan Note (Signed)
Stable Lab Results  Component Value Date   HGBA1C 6.1 01/05/2020   Glucose readings at home are ok

## 2020-03-13 NOTE — Assessment & Plan Note (Signed)
No significant changes- Hb slt lower after hydration in hospital Reviewed hospital records, lab results and studies in detail  She will continue nephroology f/u

## 2020-03-13 NOTE — Assessment & Plan Note (Signed)
Noted at admit for dizziness Resolved soon after

## 2020-03-13 NOTE — Assessment & Plan Note (Signed)
bp in fair control at this time  BP Readings from Last 1 Encounters:  03/11/20 (!) 136/60   No changes needed Most recent labs reviewed  Disc lifstyle change with low sodium diet and exercise  Better on 2nd check after sitting Hypertensive urgency from hospital quickly resolved

## 2020-03-13 NOTE — Assessment & Plan Note (Signed)
Cr went up due to dehydration when pt was hosp for vertiginous dizziness This corrected to her baseline soon after Reviewed hospital records, lab results and studies in detail  Will f/u with her nephrologist as planned

## 2020-03-13 NOTE — Assessment & Plan Note (Addendum)
Hospitalized with positional/vertiginous dizziness in the setting of uti and dehydration  CT and MRI of head were reassuring Chronic or recurrent L mastoid/middle ear effusion noted on imaging- ? If related to dizziness Reviewed hospital records, lab results and studies in detail   inst pt that we would consider ENT visit if dizziness returns Nl exam today  PT/OT continues at home after successful SNF stay

## 2020-03-14 DIAGNOSIS — F329 Major depressive disorder, single episode, unspecified: Secondary | ICD-10-CM

## 2020-03-14 DIAGNOSIS — Z87440 Personal history of urinary (tract) infections: Secondary | ICD-10-CM

## 2020-03-14 DIAGNOSIS — I69354 Hemiplegia and hemiparesis following cerebral infarction affecting left non-dominant side: Secondary | ICD-10-CM | POA: Diagnosis not present

## 2020-03-14 DIAGNOSIS — R69 Illness, unspecified: Secondary | ICD-10-CM | POA: Diagnosis not present

## 2020-03-14 DIAGNOSIS — Z9181 History of falling: Secondary | ICD-10-CM

## 2020-03-14 DIAGNOSIS — N183 Chronic kidney disease, stage 3 unspecified: Secondary | ICD-10-CM | POA: Diagnosis not present

## 2020-03-14 DIAGNOSIS — M1990 Unspecified osteoarthritis, unspecified site: Secondary | ICD-10-CM

## 2020-03-14 DIAGNOSIS — E11319 Type 2 diabetes mellitus with unspecified diabetic retinopathy without macular edema: Secondary | ICD-10-CM | POA: Diagnosis not present

## 2020-03-14 DIAGNOSIS — I129 Hypertensive chronic kidney disease with stage 1 through stage 4 chronic kidney disease, or unspecified chronic kidney disease: Secondary | ICD-10-CM | POA: Diagnosis not present

## 2020-03-14 DIAGNOSIS — F419 Anxiety disorder, unspecified: Secondary | ICD-10-CM

## 2020-03-14 DIAGNOSIS — Z7982 Long term (current) use of aspirin: Secondary | ICD-10-CM

## 2020-03-15 DIAGNOSIS — Z9181 History of falling: Secondary | ICD-10-CM | POA: Diagnosis not present

## 2020-03-15 DIAGNOSIS — E11319 Type 2 diabetes mellitus with unspecified diabetic retinopathy without macular edema: Secondary | ICD-10-CM | POA: Diagnosis not present

## 2020-03-15 DIAGNOSIS — I129 Hypertensive chronic kidney disease with stage 1 through stage 4 chronic kidney disease, or unspecified chronic kidney disease: Secondary | ICD-10-CM | POA: Diagnosis not present

## 2020-03-15 DIAGNOSIS — I69354 Hemiplegia and hemiparesis following cerebral infarction affecting left non-dominant side: Secondary | ICD-10-CM | POA: Diagnosis not present

## 2020-03-15 DIAGNOSIS — M1990 Unspecified osteoarthritis, unspecified site: Secondary | ICD-10-CM | POA: Diagnosis not present

## 2020-03-15 DIAGNOSIS — Z7982 Long term (current) use of aspirin: Secondary | ICD-10-CM | POA: Diagnosis not present

## 2020-03-15 DIAGNOSIS — R69 Illness, unspecified: Secondary | ICD-10-CM | POA: Diagnosis not present

## 2020-03-15 DIAGNOSIS — N183 Chronic kidney disease, stage 3 unspecified: Secondary | ICD-10-CM | POA: Diagnosis not present

## 2020-03-15 DIAGNOSIS — Z87440 Personal history of urinary (tract) infections: Secondary | ICD-10-CM | POA: Diagnosis not present

## 2020-03-16 DIAGNOSIS — Z87440 Personal history of urinary (tract) infections: Secondary | ICD-10-CM | POA: Diagnosis not present

## 2020-03-16 DIAGNOSIS — R69 Illness, unspecified: Secondary | ICD-10-CM | POA: Diagnosis not present

## 2020-03-16 DIAGNOSIS — I69354 Hemiplegia and hemiparesis following cerebral infarction affecting left non-dominant side: Secondary | ICD-10-CM | POA: Diagnosis not present

## 2020-03-16 DIAGNOSIS — Z7982 Long term (current) use of aspirin: Secondary | ICD-10-CM | POA: Diagnosis not present

## 2020-03-16 DIAGNOSIS — M1990 Unspecified osteoarthritis, unspecified site: Secondary | ICD-10-CM | POA: Diagnosis not present

## 2020-03-16 DIAGNOSIS — N183 Chronic kidney disease, stage 3 unspecified: Secondary | ICD-10-CM | POA: Diagnosis not present

## 2020-03-16 DIAGNOSIS — I129 Hypertensive chronic kidney disease with stage 1 through stage 4 chronic kidney disease, or unspecified chronic kidney disease: Secondary | ICD-10-CM | POA: Diagnosis not present

## 2020-03-16 DIAGNOSIS — Z9181 History of falling: Secondary | ICD-10-CM | POA: Diagnosis not present

## 2020-03-16 DIAGNOSIS — E11319 Type 2 diabetes mellitus with unspecified diabetic retinopathy without macular edema: Secondary | ICD-10-CM | POA: Diagnosis not present

## 2020-03-17 DIAGNOSIS — I69354 Hemiplegia and hemiparesis following cerebral infarction affecting left non-dominant side: Secondary | ICD-10-CM | POA: Diagnosis not present

## 2020-03-17 DIAGNOSIS — E11319 Type 2 diabetes mellitus with unspecified diabetic retinopathy without macular edema: Secondary | ICD-10-CM | POA: Diagnosis not present

## 2020-03-17 DIAGNOSIS — N183 Chronic kidney disease, stage 3 unspecified: Secondary | ICD-10-CM | POA: Diagnosis not present

## 2020-03-17 DIAGNOSIS — I129 Hypertensive chronic kidney disease with stage 1 through stage 4 chronic kidney disease, or unspecified chronic kidney disease: Secondary | ICD-10-CM | POA: Diagnosis not present

## 2020-03-17 DIAGNOSIS — Z87440 Personal history of urinary (tract) infections: Secondary | ICD-10-CM | POA: Diagnosis not present

## 2020-03-17 DIAGNOSIS — Z7982 Long term (current) use of aspirin: Secondary | ICD-10-CM | POA: Diagnosis not present

## 2020-03-17 DIAGNOSIS — M1990 Unspecified osteoarthritis, unspecified site: Secondary | ICD-10-CM | POA: Diagnosis not present

## 2020-03-17 DIAGNOSIS — Z9181 History of falling: Secondary | ICD-10-CM | POA: Diagnosis not present

## 2020-03-17 DIAGNOSIS — R69 Illness, unspecified: Secondary | ICD-10-CM | POA: Diagnosis not present

## 2020-03-18 DIAGNOSIS — Z7982 Long term (current) use of aspirin: Secondary | ICD-10-CM | POA: Diagnosis not present

## 2020-03-18 DIAGNOSIS — M1990 Unspecified osteoarthritis, unspecified site: Secondary | ICD-10-CM | POA: Diagnosis not present

## 2020-03-18 DIAGNOSIS — I69354 Hemiplegia and hemiparesis following cerebral infarction affecting left non-dominant side: Secondary | ICD-10-CM | POA: Diagnosis not present

## 2020-03-18 DIAGNOSIS — N183 Chronic kidney disease, stage 3 unspecified: Secondary | ICD-10-CM | POA: Diagnosis not present

## 2020-03-18 DIAGNOSIS — Z9181 History of falling: Secondary | ICD-10-CM | POA: Diagnosis not present

## 2020-03-18 DIAGNOSIS — E11319 Type 2 diabetes mellitus with unspecified diabetic retinopathy without macular edema: Secondary | ICD-10-CM | POA: Diagnosis not present

## 2020-03-18 DIAGNOSIS — R69 Illness, unspecified: Secondary | ICD-10-CM | POA: Diagnosis not present

## 2020-03-18 DIAGNOSIS — Z87440 Personal history of urinary (tract) infections: Secondary | ICD-10-CM | POA: Diagnosis not present

## 2020-03-18 DIAGNOSIS — I129 Hypertensive chronic kidney disease with stage 1 through stage 4 chronic kidney disease, or unspecified chronic kidney disease: Secondary | ICD-10-CM | POA: Diagnosis not present

## 2020-03-20 DIAGNOSIS — D649 Anemia, unspecified: Secondary | ICD-10-CM | POA: Diagnosis not present

## 2020-03-20 DIAGNOSIS — E669 Obesity, unspecified: Secondary | ICD-10-CM | POA: Diagnosis not present

## 2020-03-20 DIAGNOSIS — G4700 Insomnia, unspecified: Secondary | ICD-10-CM | POA: Diagnosis not present

## 2020-03-20 DIAGNOSIS — H547 Unspecified visual loss: Secondary | ICD-10-CM | POA: Diagnosis not present

## 2020-03-20 DIAGNOSIS — E1165 Type 2 diabetes mellitus with hyperglycemia: Secondary | ICD-10-CM | POA: Diagnosis not present

## 2020-03-20 DIAGNOSIS — I69334 Monoplegia of upper limb following cerebral infarction affecting left non-dominant side: Secondary | ICD-10-CM | POA: Diagnosis not present

## 2020-03-20 DIAGNOSIS — R69 Illness, unspecified: Secondary | ICD-10-CM | POA: Diagnosis not present

## 2020-03-20 DIAGNOSIS — I10 Essential (primary) hypertension: Secondary | ICD-10-CM | POA: Diagnosis not present

## 2020-03-20 DIAGNOSIS — E785 Hyperlipidemia, unspecified: Secondary | ICD-10-CM | POA: Diagnosis not present

## 2020-03-23 DIAGNOSIS — I129 Hypertensive chronic kidney disease with stage 1 through stage 4 chronic kidney disease, or unspecified chronic kidney disease: Secondary | ICD-10-CM | POA: Diagnosis not present

## 2020-03-23 DIAGNOSIS — I69354 Hemiplegia and hemiparesis following cerebral infarction affecting left non-dominant side: Secondary | ICD-10-CM | POA: Diagnosis not present

## 2020-03-23 DIAGNOSIS — Z87440 Personal history of urinary (tract) infections: Secondary | ICD-10-CM | POA: Diagnosis not present

## 2020-03-23 DIAGNOSIS — N183 Chronic kidney disease, stage 3 unspecified: Secondary | ICD-10-CM | POA: Diagnosis not present

## 2020-03-23 DIAGNOSIS — Z7982 Long term (current) use of aspirin: Secondary | ICD-10-CM | POA: Diagnosis not present

## 2020-03-23 DIAGNOSIS — E11319 Type 2 diabetes mellitus with unspecified diabetic retinopathy without macular edema: Secondary | ICD-10-CM | POA: Diagnosis not present

## 2020-03-23 DIAGNOSIS — Z9181 History of falling: Secondary | ICD-10-CM | POA: Diagnosis not present

## 2020-03-23 DIAGNOSIS — M1990 Unspecified osteoarthritis, unspecified site: Secondary | ICD-10-CM | POA: Diagnosis not present

## 2020-03-23 DIAGNOSIS — R69 Illness, unspecified: Secondary | ICD-10-CM | POA: Diagnosis not present

## 2020-03-24 DIAGNOSIS — N183 Chronic kidney disease, stage 3 unspecified: Secondary | ICD-10-CM | POA: Diagnosis not present

## 2020-03-24 DIAGNOSIS — R69 Illness, unspecified: Secondary | ICD-10-CM | POA: Diagnosis not present

## 2020-03-24 DIAGNOSIS — Z87440 Personal history of urinary (tract) infections: Secondary | ICD-10-CM | POA: Diagnosis not present

## 2020-03-24 DIAGNOSIS — I129 Hypertensive chronic kidney disease with stage 1 through stage 4 chronic kidney disease, or unspecified chronic kidney disease: Secondary | ICD-10-CM | POA: Diagnosis not present

## 2020-03-24 DIAGNOSIS — E11319 Type 2 diabetes mellitus with unspecified diabetic retinopathy without macular edema: Secondary | ICD-10-CM | POA: Diagnosis not present

## 2020-03-24 DIAGNOSIS — Z9181 History of falling: Secondary | ICD-10-CM | POA: Diagnosis not present

## 2020-03-24 DIAGNOSIS — Z7982 Long term (current) use of aspirin: Secondary | ICD-10-CM | POA: Diagnosis not present

## 2020-03-24 DIAGNOSIS — M1990 Unspecified osteoarthritis, unspecified site: Secondary | ICD-10-CM | POA: Diagnosis not present

## 2020-03-24 DIAGNOSIS — I69354 Hemiplegia and hemiparesis following cerebral infarction affecting left non-dominant side: Secondary | ICD-10-CM | POA: Diagnosis not present

## 2020-03-27 DIAGNOSIS — R69 Illness, unspecified: Secondary | ICD-10-CM | POA: Diagnosis not present

## 2020-03-28 ENCOUNTER — Encounter (INDEPENDENT_AMBULATORY_CARE_PROVIDER_SITE_OTHER): Payer: Self-pay | Admitting: Otolaryngology

## 2020-03-28 ENCOUNTER — Other Ambulatory Visit: Payer: Self-pay

## 2020-03-28 ENCOUNTER — Ambulatory Visit (INDEPENDENT_AMBULATORY_CARE_PROVIDER_SITE_OTHER): Payer: Medicare HMO | Admitting: Otolaryngology

## 2020-03-28 VITALS — Temp 97.3°F

## 2020-03-28 DIAGNOSIS — H6123 Impacted cerumen, bilateral: Secondary | ICD-10-CM | POA: Diagnosis not present

## 2020-03-28 DIAGNOSIS — H6502 Acute serous otitis media, left ear: Secondary | ICD-10-CM | POA: Diagnosis not present

## 2020-03-28 MED ORDER — AMOXICILLIN-POT CLAVULANATE 875-125 MG PO TABS: 1.0000 | ORAL_TABLET | Freq: Two times a day (BID) | ORAL | 0 refills | Status: AC

## 2020-03-28 MED ORDER — TRIAMCINOLONE ACETONIDE 55 MCG/ACT NA AERO: 2.0000 | INHALATION_SPRAY | Freq: Every day | NASAL | 5 refills | Status: DC

## 2020-03-28 NOTE — Progress Notes (Signed)
HPI: JOSY PEADEN is a 86 y.o. female who returns today for evaluation of wax buildup in her ears.  She wears bilateral hearing aids.  She has also has some dizziness..  She complains of decreased hearing on the left side. Patient has not been having any ear pain or discomfort but just decreased hearing.  Past Medical History:  Diagnosis Date   Angioedema    possibly from voltaren   Bronchopneumonia 12/11/2016   Degenerative disc disease    Diabetes mellitus    type II   Hyperlipidemia    Hypertension    LVH (left ventricular hypertrophy)    and atrial enlargement by echo in past with nl EF   Nasal pruritis    Osteoarthritis    Osteopenia    Renal insufficiency    Sleep apnea    Stroke ([place]) 05/2010   Small vessel sobcortical (in Point trial) with Dr Leonie Man, residual L hemiparesis   Vitamin B 12 deficiency 04/08   Past Surgical History:  Procedure Laterality Date   ABDOMINAL HYSTERECTOMY     BSO-fibroids   APPENDECTOMY     BACK SURGERY     COLON SURGERY     due to punctured intestines   EYE SURGERY     cataract extraction   KNEE SURGERY     arthroscope   PARS PLANA VITRECTOMY  07/31/2011   Procedure: PARS PLANA VITRECTOMY WITH 25 GAUGE;  Surgeon: [name], MD;  Location: [facility];  Service: Ophthalmology;  Laterality: Right;  REMOVAL OF SILICONE OIL AND LASER RIGHT EYE   RETINAL DETACHMENT SURGERY  02/18/11   times 2   SPINE SURGERY  08/09   spinal decompression surgery   Social History   Socioeconomic History   Marital status: Widowed    Spouse name: Not on file   Number of children: Not on file   Years of education: Not on file   Highest education level: Not on file  Occupational History   Not on file  Tobacco Use   Smoking status: Never Smoker   Smokeless tobacco: Never Used  Vaping Use   Vaping Use: Never assessed  Substance and Sexual Activity   Alcohol use: No    Alcohol/week: 0.0 standard drinks   Drug use: No    Sexual activity: Never  Other Topics Concern   Not on file  Social History Narrative   Not on file   Social Determinants of Health   Financial Resource Strain:    Difficulty of Paying Living Expenses:   Food Insecurity:    Worried About Charity fundraiser in the Last Year:    Arboriculturist in the Last Year:   Transportation Needs:    Film/video editor (Medical):    Lack of Transportation (Non-Medical):   Physical Activity:    Days of Exercise per Week:    Minutes of Exercise per Session:   Stress:    Feeling of Stress :   Social Connections:    Frequency of Communication with Friends and Family:    Frequency of Social Gatherings with Friends and Family:    Attends Religious Services:    Active Member of Clubs or Organizations:    Attends Music therapist:    Marital Status:    Family History  Problem Relation Age of Onset   COPD Brother    Cancer Sister        brain tumor with hemmorhage   Heart disease Sister  CAD   Allergies  Allergen Reactions   Diclofenac Sodium Other (See Comments)    REACTION: angioedema   Pioglitazone Swelling   Prior to Admission medications   Medication Sig Start Date End Date Taking? Authorizing Provider  acetaminophen (TYLENOL) 325 MG tablet Take 650 mg by mouth every 6 (six) hours as needed.   Yes [provider]  albuterol (VENTOLIN HFA) 108 (90 Base) MCG/ACT inhaler Inhale 2 puffs into the lungs every 4 (four) hours as needed for wheezing or shortness of breath. 07/13/19  Yes Tower, Wynelle Fanny, MD  ALPRAZolam (XANAX) 0.5 MG tablet TAKE 1/2 TO 1 TABLET BY MOUTH TWICE A DAY AS NEEDED FOR ANXIETY 03/09/20  Yes Tower, Marne A, MD  amLODipine (NORVASC) 10 MG tablet Take 1 tablet (10 mg total) by mouth daily. 09/30/18  Yes Tower, Wynelle Fanny, MD  aspirin 325 MG tablet Take 325 mg by mouth daily.    Yes [provider]  Blood Glucose Monitoring Suppl (ONE TOUCH ULTRA 2) w/Device KIT  Check blood sugar once daily and as directed. Dx E11.9 08/27/18  Yes Tower, Wynelle Fanny, MD  calcium-vitamin D (OSCAL WITH D) 500-200 MG-UNIT per tablet Take 1 tablet by mouth daily.    Yes [provider]  Cholecalciferol (VITAMIN D) 1000 UNITS capsule Take 1,000 Units by mouth daily.    Yes [provider]  docusate sodium (COLACE) 100 MG capsule Take 200 mg by mouth at bedtime.   Yes [provider]  ferrous sulfate 325 (65 FE) MG tablet Take 325 mg by mouth 2 (two) times daily.   Yes [provider]  gemfibrozil (LOPID) 600 MG tablet TAKE 1 TABLET BY MOUTH EVERY DAY Patient taking differently: Take 600 mg by mouth daily.  09/08/19  Yes Tower, Marne A, MD  glipiZIDE (GLUCOTROL XL) 2.5 MG 24 hr tablet TAKE 1 TABLET BY MOUTH EVERY DAY WITH BREAKFAST Patient taking differently: Take 2.5 mg by mouth daily with breakfast.  07/20/19  Yes Tower, Wynelle Fanny, MD  hydrALAZINE (APRESOLINE) 100 MG tablet Take 100 mg by mouth 3 (three) times daily.  08/30/14  Yes [provider]  metoprolol succinate (TOPROL-XL) 25 MG 24 hr tablet Take 0.5 tablets (12.5 mg total) by mouth 2 (two) times daily. 09/30/18  Yes Tower, Wynelle Fanny, MD  mirtazapine (REMERON) 15 MG tablet TAKE 1/2 TABLET BY MOUTH AT BEDTIME Patient taking differently: Take 7.5 mg by mouth at bedtime.  06/16/19  Yes Tower, Wynelle Fanny, MD  Multiple Vitamin (MULTIVITAMIN) capsule Take 1 capsule by mouth daily.    Yes [provider]  Olopatadine HCl (PATADAY OP) Place 1 drop into both eyes daily.   Yes [provider]  OneTouch Delica Lancets 41O MISC CHECK BLOOD SUGAR ONCE DAILY AND AS DIRECTED. DX E11.9 Patient taking differently: 1 Device by Other route daily.  10/23/19  Yes Tower, Wynelle Fanny, MD  ONETOUCH ULTRA test strip CHECK BLOOD SUGAR ONCE DAILY AND AS DIRECTED. DX E11.9 12/22/19  Yes Tower, Wynelle Fanny, MD  sertraline (ZOLOFT) 50 MG tablet TAKE 1 TABLET BY MOUTH EVERY DAY Patient taking differently: Take 50  mg by mouth daily.  09/08/19  Yes Tower, Wynelle Fanny, MD  trandolapril (MAVIK) 4 MG tablet Take 1 tablet (4 mg total) by mouth daily. 09/30/18  Yes Tower, Wynelle Fanny, MD  vitamin B-12 (CYANOCOBALAMIN) 1000 MCG tablet Take 1,000 mcg by mouth daily.    Yes [provider]  amoxicillin-clavulanate (AUGMENTIN) 875-125 MG tablet Take 1 tablet  by mouth 2 (two) times daily for 10 days. 03/28/20 04/07/20  [name], MD  triamcinolone (NASACORT) 55 MCG/ACT AERO nasal inhaler Place 2 sprays into the nose daily. 03/28/20   [name], MD     Positive ROS: Otherwise negative  All other systems have been reviewed and were otherwise negative with the exception of those mentioned in the HPI and as above.  Physical Exam: Constitutional: Alert, well-appearing, no acute distress Ears: External ears without lesions or tenderness.  She had a small amount of wax buildup on both sides that was cleaned with curettes.  Right TM is clear but the left TM is slightly retracted with a serous otitis.  On tuning fork testing Weber lateralized to the left side and BC > AC on the left side.  Dix-Hallpike testing was negative for BPPV.  I was able to insufflate some air behind the left TM with improved hearing. Nasal: External nose without lesions.. Clear nasal passages bilaterally with no signs of infection. Oral: Lips and gums without lesions. Tongue and palate mucosa without lesions. Posterior oropharynx clear. Neck: No palpable adenopathy or masses Respiratory: Breathing comfortably  Skin: No facial/neck lesions or rash noted.  Cerumen impaction removal  Date/Time: 03/28/2020 4:46 PM Performed by: [name], MD Authorized by: [name], MD   Consent:    Consent obtained:  Verbal   Consent given by:  Patient   Risks discussed:  Pain and bleeding Procedure details:    Location:  L ear and R ear   Procedure type: curette   Post-procedure details:    Inspection:  TM intact  and canal normal   Hearing quality:  Improved   Patient tolerance of procedure:  Tolerated well, no immediate complications Comments:     Left TM is retracted with a serous otitis.  Right TM is normal.    Assessment: Left serous otitis media. Minimal wax buildup. Patient wears bilateral hearing aids.  Plan: Placed her on Nasacort 2 sprays each nostril at night along with amoxicillin 875 mg twice daily for 10 days. She will follow-up as needed.   Radene Journey, MD

## 2020-03-29 DIAGNOSIS — Z7982 Long term (current) use of aspirin: Secondary | ICD-10-CM | POA: Diagnosis not present

## 2020-03-29 DIAGNOSIS — N183 Chronic kidney disease, stage 3 unspecified: Secondary | ICD-10-CM | POA: Diagnosis not present

## 2020-03-29 DIAGNOSIS — I69354 Hemiplegia and hemiparesis following cerebral infarction affecting left non-dominant side: Secondary | ICD-10-CM | POA: Diagnosis not present

## 2020-03-29 DIAGNOSIS — Z9181 History of falling: Secondary | ICD-10-CM | POA: Diagnosis not present

## 2020-03-29 DIAGNOSIS — M1990 Unspecified osteoarthritis, unspecified site: Secondary | ICD-10-CM | POA: Diagnosis not present

## 2020-03-29 DIAGNOSIS — E11319 Type 2 diabetes mellitus with unspecified diabetic retinopathy without macular edema: Secondary | ICD-10-CM | POA: Diagnosis not present

## 2020-03-29 DIAGNOSIS — Z87440 Personal history of urinary (tract) infections: Secondary | ICD-10-CM | POA: Diagnosis not present

## 2020-03-29 DIAGNOSIS — I129 Hypertensive chronic kidney disease with stage 1 through stage 4 chronic kidney disease, or unspecified chronic kidney disease: Secondary | ICD-10-CM | POA: Diagnosis not present

## 2020-03-29 DIAGNOSIS — R69 Illness, unspecified: Secondary | ICD-10-CM | POA: Diagnosis not present

## 2020-03-30 ENCOUNTER — Ambulatory Visit (HOSPITAL_COMMUNITY)
Admission: EM | Admit: 2020-03-30 | Discharge: 2020-03-30 | Disposition: A | Discharge status: Home / Self Care | Payer: Medicare HMO | Attending: Emergency Medicine | Admitting: Emergency Medicine

## 2020-03-30 ENCOUNTER — Encounter (HOSPITAL_COMMUNITY): Payer: Self-pay | Admitting: Emergency Medicine

## 2020-03-30 ENCOUNTER — Other Ambulatory Visit: Payer: Self-pay

## 2020-03-30 ENCOUNTER — Telehealth: Payer: Self-pay

## 2020-03-30 DIAGNOSIS — E11319 Type 2 diabetes mellitus with unspecified diabetic retinopathy without macular edema: Secondary | ICD-10-CM | POA: Diagnosis not present

## 2020-03-30 DIAGNOSIS — R69 Illness, unspecified: Secondary | ICD-10-CM | POA: Diagnosis not present

## 2020-03-30 DIAGNOSIS — E860 Dehydration: Secondary | ICD-10-CM

## 2020-03-30 DIAGNOSIS — Z9181 History of falling: Secondary | ICD-10-CM | POA: Diagnosis not present

## 2020-03-30 DIAGNOSIS — R739 Hyperglycemia, unspecified: Secondary | ICD-10-CM | POA: Diagnosis not present

## 2020-03-30 DIAGNOSIS — Z711 Person with feared health complaint in whom no diagnosis is made: Secondary | ICD-10-CM | POA: Diagnosis not present

## 2020-03-30 DIAGNOSIS — N183 Chronic kidney disease, stage 3 unspecified: Secondary | ICD-10-CM | POA: Diagnosis not present

## 2020-03-30 DIAGNOSIS — Z7982 Long term (current) use of aspirin: Secondary | ICD-10-CM | POA: Diagnosis not present

## 2020-03-30 DIAGNOSIS — I129 Hypertensive chronic kidney disease with stage 1 through stage 4 chronic kidney disease, or unspecified chronic kidney disease: Secondary | ICD-10-CM | POA: Diagnosis not present

## 2020-03-30 DIAGNOSIS — M1990 Unspecified osteoarthritis, unspecified site: Secondary | ICD-10-CM | POA: Diagnosis not present

## 2020-03-30 DIAGNOSIS — I69354 Hemiplegia and hemiparesis following cerebral infarction affecting left non-dominant side: Secondary | ICD-10-CM | POA: Diagnosis not present

## 2020-03-30 DIAGNOSIS — Z87440 Personal history of urinary (tract) infections: Secondary | ICD-10-CM | POA: Diagnosis not present

## 2020-03-30 LAB — POCT URINALYSIS DIPSTICK, ED / UC
Bilirubin Urine: NEGATIVE
Glucose, UA: NEGATIVE mg/dL
Hgb urine dipstick: NEGATIVE
Leukocytes,Ua: NEGATIVE
Nitrite: NEGATIVE
Protein, ur: 30 mg/dL — AB
Specific Gravity, Urine: >=1.03 (ref 1.005–1.030)
Urobilinogen, UA: 0.2 mg/dL (ref 0.0–1.0)
pH: 5.5 (ref 5.0–8.0)

## 2020-03-30 LAB — CBG MONITORING, ED: Glucose-Capillary: 236 mg/dL — ABNORMAL HIGH (ref 70–99)

## 2020-03-30 NOTE — Discharge Instructions (Signed)
Hydrate well with at least 32-48 ounces of water daily. Limit caffeine, avoid fruit juices, sodas and sweet tea. Change your pulse oximeter at home.

## 2020-03-30 NOTE — ED Provider Notes (Signed)
[facility]   MRN: [id number] DOB: 23-Sep-1928  Subjective:   Renee [name] is a 86 y.o. female presenting for check on her pulse oximetry, UTI.  Patient presents with her family member, states that one of their healthcare providers that went to their home use a pulse oximeter and was satting at 76 and 80%.  They advised that they come in for a recheck here in our clinic.  Patient has denied fever, chest pain, shortness of breath, cough.  While she was here they wanted to make sure that she got checked for a UTI.  Denies any symptoms of dysuria, urinary frequency, hematuria.  She has had previous difficulty with a UTI.  Admits that she does not hydrate as well as showed with water.  Had a recent hospitalization for UTI in March.  She is diabetic and is compliant with her medications.  Would like to have her left ear checked that she is taking an antibiotic for an ear infection.  No current facility-administered medications for this encounter.  Current Outpatient Medications:  .  acetaminophen (TYLENOL) 325 MG tablet, Take 650 mg by mouth every 6 (six) hours as needed., Disp: , Rfl:  .  albuterol (VENTOLIN HFA) 108 (90 Base) MCG/ACT inhaler, Inhale 2 puffs into the lungs every 4 (four) hours as needed for wheezing or shortness of breath., Disp: 18 g, Rfl: 3 .  ALPRAZolam (XANAX) 0.5 MG tablet, TAKE 1/2 TO 1 TABLET BY MOUTH TWICE A DAY AS NEEDED FOR ANXIETY, Disp: 60 tablet, Rfl: 0 .  amLODipine (NORVASC) 10 MG tablet, Take 1 tablet (10 mg total) by mouth daily., Disp: 90 tablet, Rfl: 3 .  amoxicillin-clavulanate (AUGMENTIN) 875-125 MG tablet, Take 1 tablet by mouth 2 (two) times daily for 10 days., Disp: 20 tablet, Rfl: 0 .  aspirin 325 MG tablet, Take 325 mg by mouth daily. , Disp: , Rfl:  .  Blood Glucose Monitoring Suppl (ONE TOUCH ULTRA 2) w/Device KIT, Check blood sugar once daily and as directed. Dx E11.9, Disp: 1 each, Rfl: 0 .  calcium-vitamin D (OSCAL WITH D) 500-200 MG-UNIT per  tablet, Take 1 tablet by mouth daily. , Disp: , Rfl:  .  Cholecalciferol (VITAMIN D) 1000 UNITS capsule, Take 1,000 Units by mouth daily. , Disp: , Rfl:  .  docusate sodium (COLACE) 100 MG capsule, Take 200 mg by mouth at bedtime., Disp: , Rfl:  .  ferrous sulfate 325 (65 FE) MG tablet, Take 325 mg by mouth 2 (two) times daily., Disp: , Rfl:  .  gemfibrozil (LOPID) 600 MG tablet, TAKE 1 TABLET BY MOUTH EVERY DAY (Patient taking differently: Take 600 mg by mouth daily. ), Disp: 90 tablet, Rfl: 2 .  glipiZIDE (GLUCOTROL XL) 2.5 MG 24 hr tablet, TAKE 1 TABLET BY MOUTH EVERY DAY WITH BREAKFAST (Patient taking differently: Take 2.5 mg by mouth daily with breakfast. ), Disp: 90 tablet, Rfl: 3 .  hydrALAZINE (APRESOLINE) 100 MG tablet, Take 100 mg by mouth 3 (three) times daily. , Disp: , Rfl:  .  metoprolol succinate (TOPROL-XL) 25 MG 24 hr tablet, Take 0.5 tablets (12.5 mg total) by mouth 2 (two) times daily., Disp: 90 tablet, Rfl: 3 .  mirtazapine (REMERON) 15 MG tablet, TAKE 1/2 TABLET BY MOUTH AT BEDTIME (Patient taking differently: Take 7.5 mg by mouth at bedtime. ), Disp: 45 tablet, Rfl: 3 .  Multiple Vitamin (MULTIVITAMIN) capsule, Take 1 capsule by mouth daily. , Disp: , Rfl:  .  Olopatadine HCl (  PATADAY OP), Place 1 drop into both eyes daily., Disp: , Rfl:  .  OneTouch Delica Lancets 26R MISC, CHECK BLOOD SUGAR ONCE DAILY AND AS DIRECTED. DX E11.9 (Patient taking differently: 1 Device by Other route daily. ), Disp: 100 each, Rfl: 2 .  ONETOUCH ULTRA test strip, CHECK BLOOD SUGAR ONCE DAILY AND AS DIRECTED. DX E11.9, Disp: 100 strip, Rfl: 1 .  sertraline (ZOLOFT) 50 MG tablet, TAKE 1 TABLET BY MOUTH EVERY DAY (Patient taking differently: Take 50 mg by mouth daily. ), Disp: 90 tablet, Rfl: 2 .  trandolapril (MAVIK) 4 MG tablet, Take 1 tablet (4 mg total) by mouth daily., Disp: 90 tablet, Rfl: 3 .  triamcinolone (NASACORT) 55 MCG/ACT AERO nasal inhaler, Place 2 sprays into the nose daily., Disp: 1  each, Rfl: 5 .  vitamin B-12 (CYANOCOBALAMIN) 1000 MCG tablet, Take 1,000 mcg by mouth daily. , Disp: , Rfl:    Allergies  Allergen Reactions  . Diclofenac Sodium Other (See Comments)    REACTION: angioedema  . Pioglitazone Swelling    Past Medical History:  Diagnosis Date  . Angioedema    possibly from voltaren  . Bronchopneumonia 12/11/2016  . Degenerative disc disease   . Diabetes mellitus    type II  . Hyperlipidemia   . Hypertension   . LVH (left ventricular hypertrophy)    and atrial enlargement by echo in past with nl EF  . Nasal pruritis   . Osteoarthritis   . Osteopenia   . Renal insufficiency   . Sleep apnea   . Stroke [facility]) 05/2010   Small vessel sobcortical (in Point trial) with Dr Leonie Man, residual L hemiparesis  . Vitamin B 12 deficiency 04/08     Past Surgical History:  Procedure Laterality Date  . ABDOMINAL HYSTERECTOMY     BSO-fibroids  . APPENDECTOMY    . BACK SURGERY    . COLON SURGERY     due to punctured intestines  . EYE SURGERY     cataract extraction  . KNEE SURGERY     arthroscope  . PARS PLANA VITRECTOMY  07/31/2011   Procedure: PARS PLANA VITRECTOMY WITH 25 GAUGE;  Surgeon: [name], MD;  Location: [place];  Service: Ophthalmology;  Laterality: Right;  REMOVAL OF SILICONE OIL AND LASER RIGHT EYE  . RETINAL DETACHMENT SURGERY  02/18/11   times 2  . SPINE SURGERY  08/09   spinal decompression surgery    Family History  Problem Relation Age of Onset  . COPD Brother   . Cancer Sister        brain tumor with hemmorhage  . Heart disease Sister        CAD    Social History   Tobacco Use  . Smoking status: Never Smoker  . Smokeless tobacco: Never Used  Vaping Use  . Vaping Use: Never assessed  Substance Use Topics  . Alcohol use: No    Alcohol/week: 0.0 standard drinks  . Drug use: No    ROS   Objective:   Vitals: BP (!) 163/65 (BP Location: Right Arm)   Pulse 78   Temp 98.9 F (37.2 C) (Oral)   Resp (!) 22    SpO2 95%   Physical Exam Constitutional:      General: She is not in acute distress.    Appearance: Normal appearance. She is well-developed. She is not ill-appearing, toxic-appearing or diaphoretic.  HENT:     Head: Normocephalic and atraumatic.     Right Ear:  Tympanic membrane, ear canal and external ear normal. There is no impacted cerumen.     Left Ear: Tympanic membrane, ear canal and external ear normal. There is no impacted cerumen.     Nose: Nose normal.     Mouth/Throat:     Mouth: Mucous membranes are moist.  Eyes:     Extraocular Movements: Extraocular movements intact.     Pupils: Pupils are equal, round, and reactive to light.  Cardiovascular:     Rate and Rhythm: Normal rate and regular rhythm.     Pulses: Normal pulses.     Heart sounds: Normal heart sounds. No murmur heard.  No friction rub. No gallop.   Pulmonary:     Effort: Pulmonary effort is normal. No respiratory distress.     Breath sounds: Normal breath sounds. No stridor. No wheezing, rhonchi or rales.  Skin:    General: Skin is warm and dry.     Findings: No rash.  Neurological:     Mental Status: She is alert and oriented to person, place, and time.  Psychiatric:        Mood and Affect: Mood normal.        Behavior: Behavior normal.        Thought Content: Thought content normal.        Judgment: Judgment normal.     Results for orders placed or performed during the hospital encounter of 03/30/20 (from the past 24 hour(s))  POC CBG monitoring     Status: Abnormal   Collection Time: 03/30/20  5:47 PM  Result Value Ref Range   Glucose-Capillary 236 (H) 70 - 99 mg/dL  POC Urinalysis dipstick     Status: Abnormal   Collection Time: 03/30/20  6:56 PM  Result Value Ref Range   Glucose, UA NEGATIVE NEGATIVE mg/dL   Bilirubin Urine NEGATIVE NEGATIVE   Ketones, ur TRACE (A) NEGATIVE mg/dL   Specific Gravity, Urine >=1.030 1.005 - 1.030   Hgb urine dipstick NEGATIVE NEGATIVE   pH 5.5 5.0 - 8.0    Protein, ur 30 (A) NEGATIVE mg/dL   Urobilinogen, UA 0.2 0.0 - 1.0 mg/dL   Nitrite NEGATIVE NEGATIVE   Leukocytes,Ua NEGATIVE NEGATIVE    Assessment and Plan :   PDMP not reviewed this encounter.  1. Worried well   2. Dehydration   3. Hyperglycemia     Reassuring physical exam findings, pulse oximetry fluctuated between 97 to 98% while I was examining patient.  No signs of urinary tract infection, recommended better hydration with plain water.  Ear exam is reassuring, finish antibiotic course.  Recheck with PCP and try to purchase a new pulse oximeter. Counseled patient on potential for adverse effects with medications prescribed/recommended today, ER and return-to-clinic precautions discussed, patient verbalized understanding.    Jaynee Eagles, PA-C 03/30/20 2004

## 2020-03-30 NOTE — ED Triage Notes (Addendum)
Pt presents to [facility] for assessment because she states she had an O2 reading of 76% at home.  When asked what prompted them to check it, she states they check it daily.  Caregiver states she was concerned about her and she had a UTI recently, so they want to be sure she doesn't have on now.  When asked about symptoms that make them concerned she states forgetfulness

## 2020-03-30 NOTE — Telephone Encounter (Signed)
Thanks for letting me know - will watch out for correspondence

## 2020-03-30 NOTE — Telephone Encounter (Signed)
Rollene Fare (DPR signed) said that pts oxygen sat at home was 79% and after reck was 83 % room air; pt does not have oxygen at home. No CP,SOB,H/A or dizziness. Rollene Fare wonders if pt has a UTI but pt has no confusion, no abd or back pain and no burning or pain upon urination. BS was 192. Rollene Fare does not want to take pt to ED to sit for hours because pt is not able to do that. Rollene Fare did agree to take pt to [facility] on [address]. FYI to Dr Glori Bickers. I asked if Rollene Fare could  please call [facility] with update on how pt is doing.

## 2020-03-31 DIAGNOSIS — N183 Chronic kidney disease, stage 3 unspecified: Secondary | ICD-10-CM | POA: Diagnosis not present

## 2020-03-31 DIAGNOSIS — I129 Hypertensive chronic kidney disease with stage 1 through stage 4 chronic kidney disease, or unspecified chronic kidney disease: Secondary | ICD-10-CM | POA: Diagnosis not present

## 2020-03-31 DIAGNOSIS — M1990 Unspecified osteoarthritis, unspecified site: Secondary | ICD-10-CM | POA: Diagnosis not present

## 2020-03-31 DIAGNOSIS — Z7982 Long term (current) use of aspirin: Secondary | ICD-10-CM | POA: Diagnosis not present

## 2020-03-31 DIAGNOSIS — Z87440 Personal history of urinary (tract) infections: Secondary | ICD-10-CM | POA: Diagnosis not present

## 2020-03-31 DIAGNOSIS — I69354 Hemiplegia and hemiparesis following cerebral infarction affecting left non-dominant side: Secondary | ICD-10-CM | POA: Diagnosis not present

## 2020-03-31 DIAGNOSIS — R69 Illness, unspecified: Secondary | ICD-10-CM | POA: Diagnosis not present

## 2020-03-31 DIAGNOSIS — Z9181 History of falling: Secondary | ICD-10-CM | POA: Diagnosis not present

## 2020-03-31 DIAGNOSIS — E11319 Type 2 diabetes mellitus with unspecified diabetic retinopathy without macular edema: Secondary | ICD-10-CM | POA: Diagnosis not present

## 2020-04-01 ENCOUNTER — Telehealth: Payer: Self-pay

## 2020-04-01 ENCOUNTER — Telehealth (INDEPENDENT_AMBULATORY_CARE_PROVIDER_SITE_OTHER): Payer: Self-pay

## 2020-04-01 DIAGNOSIS — Z87440 Personal history of urinary (tract) infections: Secondary | ICD-10-CM | POA: Diagnosis not present

## 2020-04-01 DIAGNOSIS — R69 Illness, unspecified: Secondary | ICD-10-CM | POA: Diagnosis not present

## 2020-04-01 DIAGNOSIS — N183 Chronic kidney disease, stage 3 unspecified: Secondary | ICD-10-CM | POA: Diagnosis not present

## 2020-04-01 DIAGNOSIS — M1990 Unspecified osteoarthritis, unspecified site: Secondary | ICD-10-CM | POA: Diagnosis not present

## 2020-04-01 DIAGNOSIS — Z7982 Long term (current) use of aspirin: Secondary | ICD-10-CM | POA: Diagnosis not present

## 2020-04-01 DIAGNOSIS — I129 Hypertensive chronic kidney disease with stage 1 through stage 4 chronic kidney disease, or unspecified chronic kidney disease: Secondary | ICD-10-CM | POA: Diagnosis not present

## 2020-04-01 DIAGNOSIS — Z9181 History of falling: Secondary | ICD-10-CM | POA: Diagnosis not present

## 2020-04-01 DIAGNOSIS — E11319 Type 2 diabetes mellitus with unspecified diabetic retinopathy without macular edema: Secondary | ICD-10-CM | POA: Diagnosis not present

## 2020-04-01 DIAGNOSIS — I69354 Hemiplegia and hemiparesis following cerebral infarction affecting left non-dominant side: Secondary | ICD-10-CM | POA: Diagnosis not present

## 2020-04-01 NOTE — Telephone Encounter (Signed)
I wrote an order on paper px to fax if needed for bmet and cbc  It is in [facility]'s IN box  She was seen at UC several days ago and urine was clear   If mental status worsens or stroke symptoms-go to ER

## 2020-04-01 NOTE — Telephone Encounter (Signed)
Colletta Maryland, nurse with [facility] called with concerns about pt's altered mental status this morning as well as pt complained of having leg cramps...   She states she wanted an order to check her Potassium lab and any other labs you would want to add as she did not notice pt was currently on a diuretic, even a potassium sparing, as she has heart failure (not on problem list)   Please advise

## 2020-04-04 DIAGNOSIS — I69354 Hemiplegia and hemiparesis following cerebral infarction affecting left non-dominant side: Secondary | ICD-10-CM | POA: Diagnosis not present

## 2020-04-04 DIAGNOSIS — E11319 Type 2 diabetes mellitus with unspecified diabetic retinopathy without macular edema: Secondary | ICD-10-CM | POA: Diagnosis not present

## 2020-04-04 DIAGNOSIS — R69 Illness, unspecified: Secondary | ICD-10-CM | POA: Diagnosis not present

## 2020-04-04 DIAGNOSIS — Z7982 Long term (current) use of aspirin: Secondary | ICD-10-CM | POA: Diagnosis not present

## 2020-04-04 DIAGNOSIS — N183 Chronic kidney disease, stage 3 unspecified: Secondary | ICD-10-CM | POA: Diagnosis not present

## 2020-04-04 DIAGNOSIS — Z9181 History of falling: Secondary | ICD-10-CM | POA: Diagnosis not present

## 2020-04-04 DIAGNOSIS — Z87440 Personal history of urinary (tract) infections: Secondary | ICD-10-CM | POA: Diagnosis not present

## 2020-04-04 DIAGNOSIS — M1990 Unspecified osteoarthritis, unspecified site: Secondary | ICD-10-CM | POA: Diagnosis not present

## 2020-04-04 DIAGNOSIS — I129 Hypertensive chronic kidney disease with stage 1 through stage 4 chronic kidney disease, or unspecified chronic kidney disease: Secondary | ICD-10-CM | POA: Diagnosis not present

## 2020-04-04 NOTE — Telephone Encounter (Signed)
Verbal orders left on verified VM of Stephanie. Advised her to call back if she needs the written order.

## 2020-04-05 ENCOUNTER — Other Ambulatory Visit: Payer: Self-pay | Admitting: Family Medicine

## 2020-04-05 DIAGNOSIS — M1990 Unspecified osteoarthritis, unspecified site: Secondary | ICD-10-CM | POA: Diagnosis not present

## 2020-04-05 DIAGNOSIS — I129 Hypertensive chronic kidney disease with stage 1 through stage 4 chronic kidney disease, or unspecified chronic kidney disease: Secondary | ICD-10-CM | POA: Diagnosis not present

## 2020-04-05 DIAGNOSIS — R69 Illness, unspecified: Secondary | ICD-10-CM | POA: Diagnosis not present

## 2020-04-05 DIAGNOSIS — Z9181 History of falling: Secondary | ICD-10-CM | POA: Diagnosis not present

## 2020-04-05 DIAGNOSIS — N183 Chronic kidney disease, stage 3 unspecified: Secondary | ICD-10-CM | POA: Diagnosis not present

## 2020-04-05 DIAGNOSIS — E11319 Type 2 diabetes mellitus with unspecified diabetic retinopathy without macular edema: Secondary | ICD-10-CM | POA: Diagnosis not present

## 2020-04-05 DIAGNOSIS — I69354 Hemiplegia and hemiparesis following cerebral infarction affecting left non-dominant side: Secondary | ICD-10-CM | POA: Diagnosis not present

## 2020-04-05 DIAGNOSIS — Z7982 Long term (current) use of aspirin: Secondary | ICD-10-CM | POA: Diagnosis not present

## 2020-04-05 DIAGNOSIS — Z87440 Personal history of urinary (tract) infections: Secondary | ICD-10-CM | POA: Diagnosis not present

## 2020-04-05 NOTE — Telephone Encounter (Signed)
Name of Medication: xanax Name of Pharmacy: [facility] or Written Date and Quantity: PCP filled on 03/09/20 #60 tabs 0 refills Last Office Visit and Type: Hospital/ Rehab f/u on 03/11/20 Next Office Visit and Type: none scheduled

## 2020-04-07 ENCOUNTER — Telehealth: Payer: Self-pay

## 2020-04-07 DIAGNOSIS — I129 Hypertensive chronic kidney disease with stage 1 through stage 4 chronic kidney disease, or unspecified chronic kidney disease: Secondary | ICD-10-CM | POA: Diagnosis not present

## 2020-04-07 DIAGNOSIS — Z87440 Personal history of urinary (tract) infections: Secondary | ICD-10-CM | POA: Diagnosis not present

## 2020-04-07 DIAGNOSIS — Z7982 Long term (current) use of aspirin: Secondary | ICD-10-CM | POA: Diagnosis not present

## 2020-04-07 DIAGNOSIS — R69 Illness, unspecified: Secondary | ICD-10-CM | POA: Diagnosis not present

## 2020-04-07 DIAGNOSIS — Z9181 History of falling: Secondary | ICD-10-CM | POA: Diagnosis not present

## 2020-04-07 DIAGNOSIS — M1990 Unspecified osteoarthritis, unspecified site: Secondary | ICD-10-CM | POA: Diagnosis not present

## 2020-04-07 DIAGNOSIS — N183 Chronic kidney disease, stage 3 unspecified: Secondary | ICD-10-CM | POA: Diagnosis not present

## 2020-04-07 DIAGNOSIS — E11319 Type 2 diabetes mellitus with unspecified diabetic retinopathy without macular edema: Secondary | ICD-10-CM | POA: Diagnosis not present

## 2020-04-07 DIAGNOSIS — I69354 Hemiplegia and hemiparesis following cerebral infarction affecting left non-dominant side: Secondary | ICD-10-CM | POA: Diagnosis not present

## 2020-04-07 NOTE — Telephone Encounter (Signed)
Pt went to UC last week with ear pain and given Augmentin. Has been feeling really tired since then. Vitals are fine. Has 1 tablet left. Has to let us know that she was not feeling well. If she should not take the last tablet, call Tillie Rung back at  [phone]. Otherwise, no call needed.

## 2020-04-07 NOTE — Telephone Encounter (Signed)
Finish the last dose.  If ear pain has not improved let me know. If fatigue does not improve or if she develops new symptoms please call/ follow up Thanks for letting me know  Get some rest also

## 2020-04-07 NOTE — Telephone Encounter (Signed)
Spoke with pt relaying Dr. Marliss Coots message.  Pt verbalizes understanding.

## 2020-04-08 DIAGNOSIS — G8190 Hemiplegia, unspecified affecting unspecified side: Secondary | ICD-10-CM | POA: Diagnosis not present

## 2020-04-12 DIAGNOSIS — R69 Illness, unspecified: Secondary | ICD-10-CM | POA: Diagnosis not present

## 2020-04-12 DIAGNOSIS — I129 Hypertensive chronic kidney disease with stage 1 through stage 4 chronic kidney disease, or unspecified chronic kidney disease: Secondary | ICD-10-CM | POA: Diagnosis not present

## 2020-04-12 DIAGNOSIS — N183 Chronic kidney disease, stage 3 unspecified: Secondary | ICD-10-CM | POA: Diagnosis not present

## 2020-04-12 DIAGNOSIS — I69354 Hemiplegia and hemiparesis following cerebral infarction affecting left non-dominant side: Secondary | ICD-10-CM | POA: Diagnosis not present

## 2020-04-12 DIAGNOSIS — M1990 Unspecified osteoarthritis, unspecified site: Secondary | ICD-10-CM | POA: Diagnosis not present

## 2020-04-12 DIAGNOSIS — Z7982 Long term (current) use of aspirin: Secondary | ICD-10-CM | POA: Diagnosis not present

## 2020-04-12 DIAGNOSIS — Z9181 History of falling: Secondary | ICD-10-CM | POA: Diagnosis not present

## 2020-04-12 DIAGNOSIS — E11319 Type 2 diabetes mellitus with unspecified diabetic retinopathy without macular edema: Secondary | ICD-10-CM | POA: Diagnosis not present

## 2020-04-12 DIAGNOSIS — Z87440 Personal history of urinary (tract) infections: Secondary | ICD-10-CM | POA: Diagnosis not present

## 2020-04-18 DIAGNOSIS — N183 Chronic kidney disease, stage 3 unspecified: Secondary | ICD-10-CM | POA: Diagnosis not present

## 2020-04-18 DIAGNOSIS — I129 Hypertensive chronic kidney disease with stage 1 through stage 4 chronic kidney disease, or unspecified chronic kidney disease: Secondary | ICD-10-CM | POA: Diagnosis not present

## 2020-04-18 DIAGNOSIS — E11319 Type 2 diabetes mellitus with unspecified diabetic retinopathy without macular edema: Secondary | ICD-10-CM | POA: Diagnosis not present

## 2020-04-18 DIAGNOSIS — M1990 Unspecified osteoarthritis, unspecified site: Secondary | ICD-10-CM | POA: Diagnosis not present

## 2020-04-18 DIAGNOSIS — Z87440 Personal history of urinary (tract) infections: Secondary | ICD-10-CM | POA: Diagnosis not present

## 2020-04-18 DIAGNOSIS — R69 Illness, unspecified: Secondary | ICD-10-CM | POA: Diagnosis not present

## 2020-04-18 DIAGNOSIS — Z9181 History of falling: Secondary | ICD-10-CM | POA: Diagnosis not present

## 2020-04-18 DIAGNOSIS — Z7982 Long term (current) use of aspirin: Secondary | ICD-10-CM | POA: Diagnosis not present

## 2020-04-18 DIAGNOSIS — I69354 Hemiplegia and hemiparesis following cerebral infarction affecting left non-dominant side: Secondary | ICD-10-CM | POA: Diagnosis not present

## 2020-04-19 DIAGNOSIS — Z9181 History of falling: Secondary | ICD-10-CM | POA: Diagnosis not present

## 2020-04-19 DIAGNOSIS — I129 Hypertensive chronic kidney disease with stage 1 through stage 4 chronic kidney disease, or unspecified chronic kidney disease: Secondary | ICD-10-CM | POA: Diagnosis not present

## 2020-04-19 DIAGNOSIS — M1990 Unspecified osteoarthritis, unspecified site: Secondary | ICD-10-CM | POA: Diagnosis not present

## 2020-04-19 DIAGNOSIS — I69354 Hemiplegia and hemiparesis following cerebral infarction affecting left non-dominant side: Secondary | ICD-10-CM | POA: Diagnosis not present

## 2020-04-19 DIAGNOSIS — Z7982 Long term (current) use of aspirin: Secondary | ICD-10-CM | POA: Diagnosis not present

## 2020-04-19 DIAGNOSIS — N183 Chronic kidney disease, stage 3 unspecified: Secondary | ICD-10-CM | POA: Diagnosis not present

## 2020-04-19 DIAGNOSIS — E11319 Type 2 diabetes mellitus with unspecified diabetic retinopathy without macular edema: Secondary | ICD-10-CM | POA: Diagnosis not present

## 2020-04-19 DIAGNOSIS — Z87440 Personal history of urinary (tract) infections: Secondary | ICD-10-CM | POA: Diagnosis not present

## 2020-04-19 DIAGNOSIS — R69 Illness, unspecified: Secondary | ICD-10-CM | POA: Diagnosis not present

## 2020-04-21 DIAGNOSIS — R600 Localized edema: Secondary | ICD-10-CM | POA: Diagnosis not present

## 2020-04-21 DIAGNOSIS — N2581 Secondary hyperparathyroidism of renal origin: Secondary | ICD-10-CM | POA: Diagnosis not present

## 2020-04-21 DIAGNOSIS — I10 Essential (primary) hypertension: Secondary | ICD-10-CM | POA: Diagnosis not present

## 2020-04-21 DIAGNOSIS — N1832 Chronic kidney disease, stage 3b: Secondary | ICD-10-CM | POA: Diagnosis not present

## 2020-04-21 DIAGNOSIS — R809 Proteinuria, unspecified: Secondary | ICD-10-CM | POA: Diagnosis not present

## 2020-04-27 DIAGNOSIS — I69354 Hemiplegia and hemiparesis following cerebral infarction affecting left non-dominant side: Secondary | ICD-10-CM | POA: Diagnosis not present

## 2020-04-27 DIAGNOSIS — E11319 Type 2 diabetes mellitus with unspecified diabetic retinopathy without macular edema: Secondary | ICD-10-CM | POA: Diagnosis not present

## 2020-04-27 DIAGNOSIS — M1990 Unspecified osteoarthritis, unspecified site: Secondary | ICD-10-CM | POA: Diagnosis not present

## 2020-04-27 DIAGNOSIS — N183 Chronic kidney disease, stage 3 unspecified: Secondary | ICD-10-CM | POA: Diagnosis not present

## 2020-04-27 DIAGNOSIS — R69 Illness, unspecified: Secondary | ICD-10-CM | POA: Diagnosis not present

## 2020-04-27 DIAGNOSIS — Z9181 History of falling: Secondary | ICD-10-CM | POA: Diagnosis not present

## 2020-04-27 DIAGNOSIS — Z87440 Personal history of urinary (tract) infections: Secondary | ICD-10-CM | POA: Diagnosis not present

## 2020-04-27 DIAGNOSIS — Z7982 Long term (current) use of aspirin: Secondary | ICD-10-CM | POA: Diagnosis not present

## 2020-04-27 DIAGNOSIS — I129 Hypertensive chronic kidney disease with stage 1 through stage 4 chronic kidney disease, or unspecified chronic kidney disease: Secondary | ICD-10-CM | POA: Diagnosis not present

## 2020-04-29 DIAGNOSIS — M1990 Unspecified osteoarthritis, unspecified site: Secondary | ICD-10-CM | POA: Diagnosis not present

## 2020-04-29 DIAGNOSIS — R69 Illness, unspecified: Secondary | ICD-10-CM | POA: Diagnosis not present

## 2020-04-29 DIAGNOSIS — Z9181 History of falling: Secondary | ICD-10-CM | POA: Diagnosis not present

## 2020-04-29 DIAGNOSIS — Z87440 Personal history of urinary (tract) infections: Secondary | ICD-10-CM | POA: Diagnosis not present

## 2020-04-29 DIAGNOSIS — I69354 Hemiplegia and hemiparesis following cerebral infarction affecting left non-dominant side: Secondary | ICD-10-CM | POA: Diagnosis not present

## 2020-04-29 DIAGNOSIS — E11319 Type 2 diabetes mellitus with unspecified diabetic retinopathy without macular edema: Secondary | ICD-10-CM | POA: Diagnosis not present

## 2020-04-29 DIAGNOSIS — Z7982 Long term (current) use of aspirin: Secondary | ICD-10-CM | POA: Diagnosis not present

## 2020-04-29 DIAGNOSIS — I129 Hypertensive chronic kidney disease with stage 1 through stage 4 chronic kidney disease, or unspecified chronic kidney disease: Secondary | ICD-10-CM | POA: Diagnosis not present

## 2020-04-29 DIAGNOSIS — N183 Chronic kidney disease, stage 3 unspecified: Secondary | ICD-10-CM | POA: Diagnosis not present

## 2020-05-02 DIAGNOSIS — M1990 Unspecified osteoarthritis, unspecified site: Secondary | ICD-10-CM | POA: Diagnosis not present

## 2020-05-02 DIAGNOSIS — Z87440 Personal history of urinary (tract) infections: Secondary | ICD-10-CM | POA: Diagnosis not present

## 2020-05-02 DIAGNOSIS — Z7982 Long term (current) use of aspirin: Secondary | ICD-10-CM | POA: Diagnosis not present

## 2020-05-02 DIAGNOSIS — N183 Chronic kidney disease, stage 3 unspecified: Secondary | ICD-10-CM | POA: Diagnosis not present

## 2020-05-02 DIAGNOSIS — I129 Hypertensive chronic kidney disease with stage 1 through stage 4 chronic kidney disease, or unspecified chronic kidney disease: Secondary | ICD-10-CM | POA: Diagnosis not present

## 2020-05-02 DIAGNOSIS — I69354 Hemiplegia and hemiparesis following cerebral infarction affecting left non-dominant side: Secondary | ICD-10-CM | POA: Diagnosis not present

## 2020-05-02 DIAGNOSIS — Z9181 History of falling: Secondary | ICD-10-CM | POA: Diagnosis not present

## 2020-05-02 DIAGNOSIS — R69 Illness, unspecified: Secondary | ICD-10-CM | POA: Diagnosis not present

## 2020-05-02 DIAGNOSIS — E11319 Type 2 diabetes mellitus with unspecified diabetic retinopathy without macular edema: Secondary | ICD-10-CM | POA: Diagnosis not present

## 2020-05-05 ENCOUNTER — Telehealth: Payer: Self-pay

## 2020-05-05 DIAGNOSIS — Z87440 Personal history of urinary (tract) infections: Secondary | ICD-10-CM | POA: Diagnosis not present

## 2020-05-05 DIAGNOSIS — I129 Hypertensive chronic kidney disease with stage 1 through stage 4 chronic kidney disease, or unspecified chronic kidney disease: Secondary | ICD-10-CM | POA: Diagnosis not present

## 2020-05-05 DIAGNOSIS — N183 Chronic kidney disease, stage 3 unspecified: Secondary | ICD-10-CM | POA: Diagnosis not present

## 2020-05-05 DIAGNOSIS — I69354 Hemiplegia and hemiparesis following cerebral infarction affecting left non-dominant side: Secondary | ICD-10-CM | POA: Diagnosis not present

## 2020-05-05 DIAGNOSIS — M1990 Unspecified osteoarthritis, unspecified site: Secondary | ICD-10-CM | POA: Diagnosis not present

## 2020-05-05 DIAGNOSIS — Z7982 Long term (current) use of aspirin: Secondary | ICD-10-CM | POA: Diagnosis not present

## 2020-05-05 DIAGNOSIS — E11319 Type 2 diabetes mellitus with unspecified diabetic retinopathy without macular edema: Secondary | ICD-10-CM | POA: Diagnosis not present

## 2020-05-05 DIAGNOSIS — Z9181 History of falling: Secondary | ICD-10-CM | POA: Diagnosis not present

## 2020-05-05 DIAGNOSIS — R69 Illness, unspecified: Secondary | ICD-10-CM | POA: Diagnosis not present

## 2020-05-05 NOTE — Telephone Encounter (Signed)
I called and spoke with [facility] Caregiver (DPR signed) to see if pt might go to UC rather than an ED. Earnest Bailey said pt does know who she is so is not totally confused; pt has already advised [place] that she is going to be at [facility] [address] [facility] on [address] in [place] in the morning when they open; I offered to call now to see what wait time is and Earnest Bailey said no thank you that Pt has already decided to wait until the morning. UC & ED precautions given and [facility] voiced understanding. FYI to Dr Glori Bickers.

## 2020-05-05 NOTE — Telephone Encounter (Signed)
Aware, prefer they go now and not wait until am but sounds like she will not

## 2020-05-05 NOTE — Telephone Encounter (Signed)
[facility] - Client TELEPHONE ADVICE RECORD [facility] Patient Name: Renee [name] Gender: Female DOB: 06/26/1929 Age: 86 Y 46 M 20 D Return Phone Number: [phone] (Primary), [phone] (Secondary) Address[place] [address] Client [facility] - Client Client Site [facility] Physician Glori Bickers, Roque Lias - MD Contact Type Call Who Is Calling Patient / Member / Family / Caregiver Call Type Triage / Clinical Caller Name [facility] Relationship To Patient Care [place] Return Phone Number [phone] (Primary) Chief Complaint Urination Pain Reason for Call Symptomatic / Request for [place] 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 [place] Not Listed [place] Translation No Nurse Assessment Nurse: Vallery Sa, RN, Cathy Date/Time (Eastern Time): 05/05/2020 2:14:01 PM Confirm and document reason for call. If symptomatic, describe symptoms. ---Earnest Bailey states that [place] developed pain with urination, chills and some confusion about 2-3 days ago. She is alert at this time. No severe breathing difficulty or blueness around her lips. Has the patient had close contact with a person known or suspected to have the novel coronavirus illness OR traveled / lives in area with major community spread (including international travel) in the last 14 days from the onset of symptoms? * If Asymptomatic, screen for exposure and travel within the last 14 days. ---No Does the patient have any new or worsening symptoms? ---Yes Will a triage be completed? ---Yes Related visit to physician within the last 2 weeks? ---No Does the PT have any chronic conditions? (i.e. diabetes, asthma, this includes High risk factors for pregnancy, etc.) ---Yes List chronic conditions. ---Anxiety, Depression, high Blood  Pressure, High Cholesterol, Diabetes, Asthma Is this a behavioral health or substance abuse call? ---No Guidelines Guideline Title Affirmed Question Affirmed Notes Nurse Date/Time (Eastern Time) Urination Pain - Female Patient sounds very sick or weak to the triager [place], RN, [place] 05/05/2020 2:15:46 PM PLEASE NOTE: All timestamps contained within this report are represented as [place] Standard Time. CONFIDENTIALTY NOTICE: This fax transmission is intended only for the addressee. It contains information that is legally privileged, confidential or otherwise protected from use or disclosure. If you are not the intended recipient, you are strictly prohibited from reviewing, disclosing, copying using or disseminating any of this information or taking any action in reliance on or regarding this information. If you have received this fax in error, please notify us immediately by telephone so that we can arrange for its return to [place]. Phone: [phone], Toll-Free: [phone], Fax: [phone] Page: 2 of 2 Call Id: [id number] [place]. Time Eilene Ghazi Time) Disposition Final User 05/05/2020 2:11:15 PM Send to Urgent Queue Wynema Birch 05/05/2020 2:16:44 PM Go to ED Now (or PCP triage) Yes Vallery Sa, RN, [place]y [place] Disagree/Comply Comply Caller Understands Yes PreDisposition Go to Urgent Care/Walk-In Clinic Care Advice Given Per Guideline GO TO ED NOW (OR PCP TRIAGE): * IF NO PCP (PRIMARY CARE PROVIDER) SECOND-LEVEL TRIAGE: You need to be seen within the next hour. Go to the [facility] at _____________ [place] as soon as you can. ANOTHER ADULT SHOULD DRIVE: * It is better and safer if another adult drives instead of you. CARE ADVICE given per Urination Pain - Female (Adult) guideline. Referrals GO TO FACILITY OTHER - SPECIFY

## 2020-05-06 ENCOUNTER — Other Ambulatory Visit: Payer: Self-pay

## 2020-05-06 ENCOUNTER — Ambulatory Visit (HOSPITAL_COMMUNITY)
Admission: EM | Admit: 2020-05-06 | Discharge: 2020-05-06 | Disposition: A | Discharge status: Home / Self Care | Payer: Medicare HMO | Attending: Emergency Medicine | Admitting: Emergency Medicine

## 2020-05-06 ENCOUNTER — Ambulatory Visit (INDEPENDENT_AMBULATORY_CARE_PROVIDER_SITE_OTHER): Payer: Medicare HMO

## 2020-05-06 ENCOUNTER — Encounter (HOSPITAL_COMMUNITY): Payer: Self-pay

## 2020-05-06 DIAGNOSIS — I517 Cardiomegaly: Secondary | ICD-10-CM | POA: Diagnosis not present

## 2020-05-06 DIAGNOSIS — R300 Dysuria: Secondary | ICD-10-CM

## 2020-05-06 DIAGNOSIS — J90 Pleural effusion, not elsewhere classified: Secondary | ICD-10-CM | POA: Insufficient documentation

## 2020-05-06 DIAGNOSIS — R0600 Dyspnea, unspecified: Secondary | ICD-10-CM

## 2020-05-06 DIAGNOSIS — N3090 Cystitis, unspecified without hematuria: Secondary | ICD-10-CM | POA: Diagnosis not present

## 2020-05-06 DIAGNOSIS — R4182 Altered mental status, unspecified: Secondary | ICD-10-CM | POA: Diagnosis not present

## 2020-05-06 LAB — POCT URINALYSIS DIPSTICK, ED / UC
Bilirubin Urine: NEGATIVE
Glucose, UA: NEGATIVE mg/dL
Ketones, ur: NEGATIVE mg/dL
Nitrite: NEGATIVE
Protein, ur: 30 mg/dL — AB
Specific Gravity, Urine: 1.02 (ref 1.005–1.030)
Urobilinogen, UA: 0.2 mg/dL (ref 0.0–1.0)
pH: 5.5 (ref 5.0–8.0)

## 2020-05-06 MED ORDER — CEPHALEXIN 500 MG PO CAPS: 500.0000 mg | ORAL_CAPSULE | Freq: Two times a day (BID) | ORAL | 0 refills | Status: DC

## 2020-05-06 NOTE — Discharge Instructions (Addendum)
Treating you for a urinary tract infection Take the antibiotic as prescribed Drink fluids You also have a small amount of fluid on your left lung.  If your shortness of breath worsens you need to go to the ER.  I will contact your doctor.  Follow up as needed for continued or worsening symptoms

## 2020-05-06 NOTE — ED Triage Notes (Addendum)
Pt c/o pain with urination for approx 3 days. Also reports SOBOE the past several days.  Caregiver reports that pt has had confusion intermittently for approx 3-4 days, higher FBG 177 this morning from pt's baseline of 120s.  Pt denies hematuria, frequency, flank/abdominal/back pain, CP, fever, chills, n/v/d.  Pt was visibly SOBOE moving from upon standing, walking 2 steps and into WC. Bilateral lung sounds slightly diminished, +1 edema BLE.   Smile with slight left droop, left hand slightly weaker grip, able to follow two-step commands, brows symmetrical. Pt has h/o stroke affecting left side.

## 2020-05-08 LAB — URINE CULTURE: Culture: >=100000 — AB

## 2020-05-09 ENCOUNTER — Telehealth (HOSPITAL_COMMUNITY): Payer: Self-pay | Admitting: Emergency Medicine

## 2020-05-09 DIAGNOSIS — G8190 Hemiplegia, unspecified affecting unspecified side: Secondary | ICD-10-CM | POA: Diagnosis not present

## 2020-05-09 MED ORDER — NITROFURANTOIN MONOHYD MACRO 100 MG PO CAPS: 100.0000 mg | ORAL_CAPSULE | Freq: Two times a day (BID) | ORAL | 0 refills | Status: DC

## 2020-05-09 NOTE — ED Provider Notes (Signed)
[facility]    CSN: [id number] Arrival date & time: 05/06/20  0846      History   Chief Complaint Chief Complaint  Patient presents with  . Dysuria  . Altered Mental Status    HPI Renee [name] is a 86 y.o. female.   Patient is 86 year old female presents today for approximate 3 days of dysuria.  Per caregiver she has had some mild confusion.  History of chronic UTIs.  Denies any hematuria, urinary frequency, flank or abdominal pain.  Denies any fever, chills.  She is also had some mild shortness of breath worse with exertion.  No known history of heart failure.  No cough, congestion.  Trace edema in bilateral lower extremities.  Is not currently on any diuretic.  History of chronic kidney disease.     Past Medical History:  Diagnosis Date  . Angioedema    possibly from voltaren  . Bronchopneumonia 12/11/2016  . Degenerative disc disease   . Diabetes mellitus    type II  . Hyperlipidemia   . Hypertension   . LVH (left ventricular hypertrophy)    and atrial enlargement by echo in past with nl EF  . Nasal pruritis   . Osteoarthritis   . Osteopenia   . Renal insufficiency   . Sleep apnea   . Stroke [facility]) 05/2010   Small vessel sobcortical (in [place] trial) with Dr Leonie Man, residual L hemiparesis  . Vitamin B 12 deficiency 04/08    Patient Active Problem List   Diagnosis Date Noted  . Dizziness 02/12/2020  . Normocytic anemia 02/12/2020  . Fall involving sidewalk curb 11/29/2019  . Contusion of back 11/27/2019  . Hyperlipidemia associated with type 2 diabetes mellitus ([place])   . Aortic atherosclerosis ([place]) 07/13/2019  . H/O sepsis 07/01/2019  . CKD (chronic kidney disease) stage 4, GFR 15-29 ml/min (HCC) 07/01/2019  . Lower abdominal pain 05/11/2019  . Routine general medical examination at a health care facility 03/30/2019  . External hemorrhoid 01/17/2018  . History of colitis 01/17/2018  . Generalized weakness 12/24/2016  . Diabetic retinopathy  ([place]) 12/11/2016  . History of CVA (cerebrovascular accident) 12/11/2016  . Estrogen deficiency 08/30/2015  . Encounter for Medicare annual wellness exam 04/17/2013  . Chest wall pain 04/07/2013  . Mobility impaired 06/26/2011  . History of retinal detachment 01/10/2011  . Sleep apnea 11/28/2010  . Anxiety and depression 08/25/2010  . Hemiplegia, late effect of cerebrovascular disease ([place]) 07/05/2010  . POSTHERPETIC NEURALGIA 11/09/2009  . Renal insufficiency 06/29/2008  . Chronic back pain 01/26/2008  . EDEMA 01/26/2008  . B12 deficiency 01/10/2007  . Type 2 diabetes, controlled, with retinopathy ([place]) 11/27/2006  . Essential hypertension 11/27/2006  . FIBROCYSTIC BREAST DISEASE 11/27/2006  . ROSACEA 11/27/2006  . OSTEOARTHRITIS 11/27/2006  . URINARY INCONTINENCE, MIXED 11/27/2006    Past Surgical History:  Procedure Laterality Date  . ABDOMINAL HYSTERECTOMY     BSO-fibroids  . APPENDECTOMY    . BACK SURGERY    . COLON SURGERY     due to punctured intestines  . EYE SURGERY     cataract extraction  . KNEE SURGERY     arthroscope  . PARS PLANA VITRECTOMY  07/31/2011   Procedure: PARS PLANA VITRECTOMY WITH 25 GAUGE;  Surgeon: [name], MD;  Location: [facility];  Service: Ophthalmology;  Laterality: Right;  REMOVAL OF SILICONE OIL AND LASER RIGHT EYE  . RETINAL DETACHMENT SURGERY  02/18/11   times 2  . SPINE SURGERY  08/09   spinal decompression surgery    OB History   No obstetric history on file.      Home Medications    Prior to Admission medications   Medication Sig Start Date End Date Taking? Authorizing Provider  ALPRAZolam (XANAX) 0.5 MG tablet TAKE 1/2 TO 1 TABLET BY MOUTH TWICE A DAY AS NEEDED FOR ANXIETY 04/06/20  Yes Tower, Marne A, MD  amLODipine (NORVASC) 10 MG tablet Take 1 tablet (10 mg total) by mouth daily. 09/30/18  Yes Tower, Wynelle Fanny, MD  aspirin 325 MG tablet Take 325 mg by mouth daily.    Yes [provider]  Blood Glucose Monitoring  Suppl (ONE TOUCH ULTRA 2) w/Device KIT Check blood sugar once daily and as directed. Dx E11.9 08/27/18  Yes Tower, Wynelle Fanny, MD  calcium-vitamin D (OSCAL WITH D) 500-200 MG-UNIT per tablet Take 1 tablet by mouth daily.    Yes [provider]  Cholecalciferol (VITAMIN D) 1000 UNITS capsule Take 1,000 Units by mouth daily.    Yes [provider]  ferrous sulfate 325 (65 FE) MG tablet Take 325 mg by mouth 2 (two) times daily.   Yes [provider]  gemfibrozil (LOPID) 600 MG tablet TAKE 1 TABLET BY MOUTH EVERY DAY Patient taking differently: Take 600 mg by mouth daily.  09/08/19  Yes Tower, Marne A, MD  glipiZIDE (GLUCOTROL XL) 2.5 MG 24 hr tablet TAKE 1 TABLET BY MOUTH EVERY DAY WITH BREAKFAST Patient taking differently: Take 2.5 mg by mouth daily with breakfast.  07/20/19  Yes Tower, Wynelle Fanny, MD  hydrALAZINE (APRESOLINE) 100 MG tablet Take 100 mg by mouth 3 (three) times daily.  08/30/14  Yes [provider]  metoprolol succinate (TOPROL-XL) 25 MG 24 hr tablet Take 0.5 tablets (12.5 mg total) by mouth 2 (two) times daily. 09/30/18  Yes Tower, Wynelle Fanny, MD  mirtazapine (REMERON) 15 MG tablet TAKE 1/2 TABLET BY MOUTH AT BEDTIME Patient taking differently: Take 7.5 mg by mouth at bedtime.  06/16/19  Yes Tower, Wynelle Fanny, MD  sertraline (ZOLOFT) 50 MG tablet TAKE 1 TABLET BY MOUTH EVERY DAY Patient taking differently: Take 50 mg by mouth daily.  09/08/19  Yes Tower, Wynelle Fanny, MD  trandolapril (MAVIK) 4 MG tablet Take 1 tablet (4 mg total) by mouth daily. 09/30/18  Yes Tower, Wynelle Fanny, MD  acetaminophen (TYLENOL) 325 MG tablet Take 650 mg by mouth every 6 (six) hours as needed.    [provider]  albuterol (VENTOLIN HFA) 108 (90 Base) MCG/ACT inhaler Inhale 2 puffs into the lungs every 4 (four) hours as needed for wheezing or shortness of breath. 07/13/19   Tower, Wynelle Fanny, MD  cephALEXin (KEFLEX) 500 MG capsule Take 1 capsule (500 mg total) by mouth 2 (two) times daily for  7 days. 05/06/20 05/13/20  Loura Halt A, NP  docusate sodium (COLACE) 100 MG capsule Take 200 mg by mouth at bedtime.    [provider]  Multiple Vitamin (MULTIVITAMIN) capsule Take 1 capsule by mouth daily.     [provider]  Olopatadine HCl (PATADAY OP) Place 1 drop into both eyes daily.    [provider]  OneTouch Delica Lancets 20U MISC CHECK BLOOD SUGAR ONCE DAILY AND AS DIRECTED. DX E11.9 Patient taking differently: 1 Device by Other route daily.  10/23/19   Tower, Wynelle Fanny, MD  ONETOUCH ULTRA test strip CHECK BLOOD SUGAR ONCE DAILY AND AS DIRECTED. DX E11.9 04/05/20   Tower, [place]  A, MD  triamcinolone (NASACORT) 55 MCG/ACT AERO nasal inhaler Place 2 sprays into the nose daily. 03/28/20   [name], MD  vitamin B-12 (CYANOCOBALAMIN) 1000 MCG tablet Take 1,000 mcg by mouth daily.     [provider]    Family History Family History  Problem Relation Age of Onset  . COPD Brother   . Cancer Sister        brain tumor with hemmorhage  . Heart disease Sister        CAD    Social History Social History   Tobacco Use  . Smoking status: Never Smoker  . Smokeless tobacco: Never Used  Vaping Use  . Vaping Use: Never assessed  Substance Use Topics  . Alcohol use: No    Alcohol/week: 0.0 standard drinks  . Drug use: No     Allergies   Diclofenac sodium and Pioglitazone   Review of Systems Review of Systems   Physical Exam Triage Vital Signs ED Triage Vitals  Enc Vitals Group     BP 05/06/20 0916 (!) 152/55     Pulse Rate 05/06/20 0916 64     Resp 05/06/20 0916 (!) 22     Temp 05/06/20 0916 97.7 F (36.5 C)     Temp Source 05/06/20 0916 Oral     SpO2 05/06/20 0912 98 %     Weight --      Height --      Head Circumference --      Peak Flow --      Pain Score 05/06/20 0912 0     Pain Loc --      Pain Edu? --      Excl. in [place]? --    No data found.  Updated Vital Signs BP (!) 152/55 (BP Location: Right Arm)   Pulse  64   Temp 97.7 F (36.5 C) (Oral)   Resp (!) 22   SpO2 98%   Visual Acuity Right Eye Distance:   Left Eye Distance:   Bilateral Distance:    Right Eye Near:   Left Eye Near:    Bilateral Near:     Physical Exam Vitals and nursing note reviewed.  Constitutional:      General: She is not in acute distress.    Appearance: Normal appearance. She is not ill-appearing, toxic-appearing or diaphoretic.  HENT:     Head: Normocephalic.     Nose: Nose normal.  Eyes:     Conjunctiva/sclera: Conjunctivae normal.  Cardiovascular:     Rate and Rhythm: Normal rate and regular rhythm.  Pulmonary:     Effort: Pulmonary effort is normal. No respiratory distress.     Breath sounds: No stridor. No wheezing, rhonchi or rales.     Comments: Crackles in lung bases   Chest:     Chest wall: No tenderness.  Musculoskeletal:        General: Normal range of motion.     Cervical back: Normal range of motion.     Right lower leg: Edema present.     Left lower leg: Edema present.     Comments: Trace edema    Skin:    General: Skin is warm and dry.     Findings: No rash.  Neurological:     Mental Status: She is alert.     Comments: Left side deficit from previous CVA    Psychiatric:        Mood and Affect: Mood normal.  UC Treatments / Results  Labs (all labs ordered are listed, but only abnormal results are displayed) Labs Reviewed  URINE CULTURE - Abnormal; Notable for the following components:      Result Value   Culture >=100,000 COLONIES/mL ESCHERICHIA COLI (*)    Organism ID, Bacteria ESCHERICHIA COLI (*)    All other components within normal limits  POCT URINALYSIS DIPSTICK, ED / UC - Abnormal; Notable for the following components:   Hgb urine dipstick TRACE (*)    Protein, ur 30 (*)    Leukocytes,Ua LARGE (*)    All other components within normal limits    EKG   Radiology No results found.  Procedures Procedures (including critical care time)  Medications  Ordered in UC Medications - No data to display  Initial Impression / Assessment and Plan / UC Course  I have reviewed the triage vital signs and the nursing notes.  Pertinent labs & imaging results that were available during my care of the patient were reviewed by me and considered in my medical decision making (see chart for details).     Cystitis Treating with Keflex. Antibiotic as prescribed.  Recommend increase fluid intake.  Shortness of breath Small pleural effusion to left lung Oxygen normal and she is in no distress.  Deferring treatment at this time due to chronic kidney disease. We will have her go to the ER for worsening shortness of breath Otherwise follow-up with her primary care as needed   Final Clinical Impressions(s) / UC Diagnoses   Final diagnoses:  Cystitis  Pleural effusion     Discharge Instructions     Treating you for a urinary tract infection Take the antibiotic as prescribed Drink fluids You also have a small amount of fluid on your left lung.  If your shortness of breath worsens you need to go to the ER.  I will contact your doctor.  Follow up as needed for continued or worsening symptoms     ED Prescriptions    Medication Sig Dispense Auth. Provider   cephALEXin (KEFLEX) 500 MG capsule Take 1 capsule (500 mg total) by mouth 2 (two) times daily for 7 days. 14 capsule Christopher Hink A, NP     PDMP not reviewed this encounter.   Loura Halt A, NP 05/09/20 0830

## 2020-05-12 ENCOUNTER — Ambulatory Visit: Payer: Medicare HMO | Admitting: Family Medicine

## 2020-05-13 ENCOUNTER — Other Ambulatory Visit: Payer: Self-pay

## 2020-05-13 ENCOUNTER — Encounter: Payer: Self-pay | Admitting: Family Medicine

## 2020-05-13 ENCOUNTER — Ambulatory Visit (INDEPENDENT_AMBULATORY_CARE_PROVIDER_SITE_OTHER): Payer: Medicare HMO | Admitting: Family Medicine

## 2020-05-13 ENCOUNTER — Ambulatory Visit (INDEPENDENT_AMBULATORY_CARE_PROVIDER_SITE_OTHER)
Admission: RE | Admit: 2020-05-13 | Discharge: 2020-05-13 | Disposition: A | Discharge status: Home / Self Care | Payer: Medicare HMO | Source: Ambulatory Visit | Attending: Family Medicine | Admitting: Family Medicine

## 2020-05-13 VITALS — BP 136/70 | HR 83 | Temp 97.1°F | Ht 61.0 in | Wt 179.5 lb

## 2020-05-13 DIAGNOSIS — N390 Urinary tract infection, site not specified: Secondary | ICD-10-CM | POA: Diagnosis not present

## 2020-05-13 DIAGNOSIS — J90 Pleural effusion, not elsewhere classified: Secondary | ICD-10-CM

## 2020-05-13 DIAGNOSIS — N3946 Mixed incontinence: Secondary | ICD-10-CM

## 2020-05-13 DIAGNOSIS — I517 Cardiomegaly: Secondary | ICD-10-CM | POA: Diagnosis not present

## 2020-05-13 DIAGNOSIS — J9811 Atelectasis: Secondary | ICD-10-CM | POA: Diagnosis not present

## 2020-05-13 DIAGNOSIS — S2231XA Fracture of one rib, right side, initial encounter for closed fracture: Secondary | ICD-10-CM | POA: Diagnosis not present

## 2020-05-13 DIAGNOSIS — Z23 Encounter for immunization: Secondary | ICD-10-CM | POA: Diagnosis not present

## 2020-05-13 DIAGNOSIS — N184 Chronic kidney disease, stage 4 (severe): Secondary | ICD-10-CM

## 2020-05-13 NOTE — Patient Instructions (Addendum)
We should re check a urine sample in 7-10 days  Finish the abx  Call the day you bring it in and let us know it is coming   Change your pad./undergarment when it is wet  Wipe front to back  Keep drinking water   I placed a referral to urology  The office will call you about that   Let's re check a chest xray today

## 2020-05-13 NOTE — Progress Notes (Signed)
Subjective:    Patient ID: Renee [name], female    DOB: 1928-09-21, 86 y.o.   MRN: [id number]  This visit occurred during the SARS-CoV-2 public health emergency.  Safety protocols were in place, including screening questions prior to the visit, additional usage of staff PPE, and extensive cleaning of exam room while observing appropriate contact time as indicated for disinfecting solutions.    HPI  Pt presents to discuss frequent utis  Wt Readings from Last 3 Encounters:  05/13/20 179 lb 8 oz (81.4 kg)  03/11/20 182 lb 9 oz (82.8 kg)  02/18/20 183 lb 3.2 oz (83.1 kg)   33.92 kg/m  [facility]  [facility]17 for dysuria and alt mental status  Treated with keflex  ua then Results for Renee [name], Renee [name] (MRN [id number]) as of 05/13/2020 11:20  Ref. Range 05/06/2020 10:01  Bilirubin Urine Latest Ref Range: NEGATIVE  NEGATIVE  Glucose, UA Latest Ref Range: NEGATIVE mg/dL NEGATIVE  Hgb urine dipstick Latest Ref Range: NEGATIVE  TRACE (A)  Ketones, ur Latest Ref Range: NEGATIVE mg/dL NEGATIVE  Leukocytes,Ua Latest Ref Range: NEGATIVE  LARGE (A)  Nitrite Latest Ref Range: NEGATIVE  NEGATIVE  pH Latest Ref Range: 5.0 - 8.0  5.5  Protein Latest Ref Range: NEGATIVE mg/dL 30 (A)  Specific Gravity, Urine Latest Ref Range: 1.005 - 1.030  1.020  Urobilinogen, UA Latest Ref Range: 0.0 - 1.0 mg/dL 0.2   Culture    Urine Culture (Order [id number]) Contains abnormal dataUrine Culture Order: [id number] Status:  Final result Visible to patient:  No (inaccessible in MyChart) Next appt:  02/13/2021 at 09:15 AM in [facility] [name], MD) Specimen Information: Urine, Random      1 Result Note Component 7 d ago  Specimen Description URINE, RANDOM   Special Requests NONE  Performed at [facility], [address] [place], [place] [address]   Culture >=100,000 COLONIES/mL ESCHERICHIA COLIAbnormal   Report Status 05/08/2020 FINAL   Organism ID, Bacteria  ESCHERICHIA COLIAbnormal   Resulting Agency [facility]  Susceptibility   Escherichia coli    MIC    AMPICILLIN >=32 RESIST... Resistant    AMPICILLIN/SULBACTAM >=32 RESIST... Resistant    CEFAZOLIN 32 INTERMED... Intermediate    CEFTRIAXONE 0.5 SENSITIVE  Sensitive    CIPROFLOXACIN <=0.25 SENS... Sensitive    GENTAMICIN <=1 SENSITIVE  Sensitive    IMIPENEM <=0.25 SENS... Sensitive    NITROFURANTOIN <=16 SENSIT... Sensitive    PIP/TAZO >=128 RESIS... Resistant    TRIMETH/SULFA <=20 SENSIT... Sensitive         Susceptibility Comments  Escherichia coli  >=100,000 COLONIES/mL ESCHERICHIA COLI         At that time she was changed to macrobid for likely resistance to cephalosporin Started that on 9/20  She had pan sensitive e coli cx in June insig growth in march   She wears a pull up  Also wears part of a pull up inside   Right now- still taking macrobid  Antibiotics make her lethargic  Otherwise feeling better   No bladder pain now  No blood in urine   Had a small L pleural effusion on the left DG Chest 2 View (Accession [id number]) [id number] [id number]) Imaging Date: 05/06/2020 Department: [facility] Released By/Authorizing: Orvan July, NP (auto-released)  Exam Status  Status  Final [99]  PACS Intelerad Image Link  Show images for DG Chest 2 View Study Result  Narrative & Impression  CLINICAL DATA:  Dyspnea with exertion.  EXAM: CHEST - 2 VIEW  COMPARISON:  October 30, 2019.  FINDINGS: Stable cardiomegaly. No pneumothorax is noted. Right lung is clear. Small left pleural effusion is noted. Bony thorax is unremarkable.  IMPRESSION: Small left pleural effusion.   Electronically Signed   By: Marijo Conception M.D.   On: 05/06/2020 10:47     H/o CKD and also mixed urinary incontinence  Lab Results  Component Value Date   CREATININE 1.18 (H) 02/13/2020   BUN 15 02/13/2020   NA 139 02/13/2020   K 4.0 02/13/2020    CL 104 02/13/2020   CO2 25 02/13/2020  followed by nephrology  Interested in seeing Dr Jeffie Pollock at [facility] urology   Patient Active Problem List   Diagnosis Date Noted  . Small pleural effusion 05/13/2020  . Frequent UTI 05/13/2020  . Dizziness 02/12/2020  . Normocytic anemia 02/12/2020  . Fall involving sidewalk curb 11/29/2019  . Contusion of back 11/27/2019  . Hyperlipidemia associated with type 2 diabetes mellitus ([place])   . Aortic atherosclerosis ([place]) 07/13/2019  . H/O sepsis 07/01/2019  . CKD (chronic kidney disease) stage 4, GFR 15-29 ml/min (HCC) 07/01/2019  . Lower abdominal pain 05/11/2019  . Routine general medical examination at a health care facility 03/30/2019  . External hemorrhoid 01/17/2018  . History of colitis 01/17/2018  . Generalized weakness 12/24/2016  . Diabetic retinopathy ([place]) 12/11/2016  . History of CVA (cerebrovascular accident) 12/11/2016  . Estrogen deficiency 08/30/2015  . Encounter for Medicare annual wellness exam 04/17/2013  . Chest wall pain 04/07/2013  . Mobility impaired 06/26/2011  . History of retinal detachment 01/10/2011  . Sleep apnea 11/28/2010  . Anxiety and depression 08/25/2010  . Hemiplegia, late effect of cerebrovascular disease ([place]) 07/05/2010  . POSTHERPETIC NEURALGIA 11/09/2009  . Renal insufficiency 06/29/2008  . Chronic back pain 01/26/2008  . EDEMA 01/26/2008  . B12 deficiency 01/10/2007  . Type 2 diabetes, controlled, with retinopathy ([place]) 11/27/2006  . Essential hypertension 11/27/2006  . FIBROCYSTIC BREAST DISEASE 11/27/2006  . ROSACEA 11/27/2006  . OSTEOARTHRITIS 11/27/2006  . URINARY INCONTINENCE, MIXED 11/27/2006   Past Medical History:  Diagnosis Date  . Angioedema    possibly from voltaren  . Bronchopneumonia 12/11/2016  . Degenerative disc disease   . Diabetes mellitus    type II  . Hyperlipidemia   . Hypertension   . LVH (left ventricular hypertrophy)    and atrial enlargement by echo in past  with nl EF  . Nasal pruritis   . Osteoarthritis   . Osteopenia   . Renal insufficiency   . Sleep apnea   . Stroke [facility]) 05/2010   Small vessel sobcortical (in [place] trial) with Dr Leonie Man, residual L hemiparesis  . Vitamin B 12 deficiency 04/08   Past Surgical History:  Procedure Laterality Date  . ABDOMINAL HYSTERECTOMY     BSO-fibroids  . APPENDECTOMY    . BACK SURGERY    . COLON SURGERY     due to punctured intestines  . EYE SURGERY     cataract extraction  . KNEE SURGERY     arthroscope  . PARS PLANA VITRECTOMY  07/31/2011   Procedure: PARS PLANA VITRECTOMY WITH 25 GAUGE;  Surgeon: [name], MD;  Location: [facility];  Service: Ophthalmology;  Laterality: Right;  REMOVAL OF SILICONE OIL AND LASER RIGHT EYE  . RETINAL DETACHMENT SURGERY  02/18/11   times 2  . SPINE SURGERY  08/09   spinal decompression surgery   Social History   Tobacco Use  . Smoking status: Never Smoker  . Smokeless tobacco: Never Used  Vaping Use  . Vaping Use: Never assessed  Substance Use Topics  . Alcohol use: No    Alcohol/week: 0.0 standard drinks  . Drug use: No   Family History  Problem Relation Age of Onset  . COPD Brother   . Cancer Sister        brain tumor with hemmorhage  . Heart disease Sister        CAD   Allergies  Allergen Reactions  . Diclofenac Sodium Other (See Comments)    REACTION: angioedema  . Pioglitazone Swelling   Current Outpatient Medications on File Prior to Visit  Medication Sig Dispense Refill  . acetaminophen (TYLENOL) 325 MG tablet Take 650 mg by mouth every 6 (six) hours as needed.    Marland Kitchen albuterol (VENTOLIN HFA) 108 (90 Base) MCG/ACT inhaler Inhale 2 puffs into the lungs every 4 (four) hours as needed for wheezing or shortness of breath. 18 g 3  . ALPRAZolam (XANAX) 0.5 MG tablet TAKE 1/2 TO 1 TABLET BY MOUTH TWICE A DAY AS NEEDED FOR ANXIETY 60 tablet 0  . amLODipine (NORVASC) 10 MG tablet Take 1 tablet (10 mg total) by mouth daily. 90 tablet 3  .  aspirin 325 MG tablet Take 325 mg by mouth daily.     . Blood Glucose Monitoring Suppl (ONE TOUCH ULTRA 2) w/Device KIT Check blood sugar once daily and as directed. Dx E11.9 1 each 0  . calcium-vitamin D (OSCAL WITH D) 500-200 MG-UNIT per tablet Take 1 tablet by mouth daily.     . Cholecalciferol (VITAMIN D) 1000 UNITS capsule Take 1,000 Units by mouth daily.     Marland Kitchen docusate sodium (COLACE) 100 MG capsule Take 200 mg by mouth at bedtime.    . ferrous sulfate 325 (65 FE) MG tablet Take 325 mg by mouth 2 (two) times daily.    Marland Kitchen gemfibrozil (LOPID) 600 MG tablet TAKE 1 TABLET BY MOUTH EVERY DAY (Patient taking differently: Take 600 mg by mouth daily. ) 90 tablet 2  . glipiZIDE (GLUCOTROL XL) 2.5 MG 24 hr tablet TAKE 1 TABLET BY MOUTH EVERY DAY WITH BREAKFAST (Patient taking differently: Take 2.5 mg by mouth daily with breakfast. ) 90 tablet 3  . hydrALAZINE (APRESOLINE) 100 MG tablet Take 100 mg by mouth 3 (three) times daily.     . metoprolol succinate (TOPROL-XL) 25 MG 24 hr tablet Take 0.5 tablets (12.5 mg total) by mouth 2 (two) times daily. 90 tablet 3  . mirtazapine (REMERON) 15 MG tablet TAKE 1/2 TABLET BY MOUTH AT BEDTIME (Patient taking differently: Take 7.5 mg by mouth at bedtime. ) 45 tablet 3  . Multiple Vitamin (MULTIVITAMIN) capsule Take 1 capsule by mouth daily.     . nitrofurantoin, macrocrystal-monohydrate, (MACROBID) 100 MG capsule Take 1 capsule (100 mg total) by mouth 2 (two) times daily. 10 capsule 0  . Olopatadine HCl (PATADAY OP) Place 1 drop into both eyes daily.    Glory Rosebush Delica Lancets 97Q MISC CHECK BLOOD SUGAR ONCE DAILY AND AS DIRECTED. DX E11.9 (Patient taking differently: 1 Device by Other route daily. ) 100 each 2  . ONETOUCH ULTRA test strip CHECK BLOOD SUGAR ONCE DAILY AND AS DIRECTED. DX E11.9 100 strip 1  . sertraline (ZOLOFT) 50 MG tablet TAKE 1 TABLET BY MOUTH EVERY DAY (Patient taking differently: Take 50 mg  by mouth daily. ) 90 tablet 2  . trandolapril  (MAVIK) 4 MG tablet Take 1 tablet (4 mg total) by mouth daily. 90 tablet 3  . triamcinolone (NASACORT) 55 MCG/ACT AERO nasal inhaler Place 2 sprays into the nose daily. 1 each 5  . vitamin B-12 (CYANOCOBALAMIN) 1000 MCG tablet Take 1,000 mcg by mouth daily.      No current facility-administered medications on file prior to visit.    Review of Systems  Constitutional: Positive for fatigue. Negative for activity change, appetite change, fever and unexpected weight change.  HENT: Negative for congestion, ear pain, rhinorrhea, sinus pressure and sore throat.   Eyes: Negative for pain, redness and visual disturbance.  Respiratory: Negative for cough, shortness of breath and wheezing.        No longer sob at rest  Cardiovascular: Negative for chest pain and palpitations.  Gastrointestinal: Negative for abdominal pain, blood in stool, constipation and diarrhea.  Endocrine: Negative for polydipsia and polyuria.  Genitourinary: Positive for frequency. Negative for dysuria and urgency.       Frequency and incontinence baseline    Musculoskeletal: Negative for arthralgias, back pain and myalgias.  Skin: Negative for pallor and rash.  Allergic/Immunologic: Negative for environmental allergies.  Neurological: Negative for dizziness, syncope and headaches.       Poor balance  Hematological: Negative for adenopathy. Does not bruise/bleed easily.  Psychiatric/Behavioral: Negative for decreased concentration and dysphoric mood. The patient is not nervous/anxious.        Objective:   Physical Exam Constitutional:      General: She is not in acute distress.    Appearance: Normal appearance. She is well-developed. She is obese. She is not ill-appearing or diaphoretic.  HENT:     Head: Normocephalic and atraumatic.  Eyes:     General: No scleral icterus.    Conjunctiva/sclera: Conjunctivae normal.     Pupils: Pupils are equal, round, and reactive to light.  Neck:     Thyroid: No thyromegaly.      Vascular: No carotid bruit or JVD.  Cardiovascular:     Rate and Rhythm: Normal rate and regular rhythm.     Heart sounds: Normal heart sounds. No gallop.   Pulmonary:     Effort: Pulmonary effort is normal. No respiratory distress.     Breath sounds: Normal breath sounds. No stridor. No wheezing, rhonchi or rales.     Comments: bs are distant at bases  Chest:     Chest wall: No tenderness.  Abdominal:     General: Bowel sounds are normal. There is no distension or abdominal bruit.     Palpations: Abdomen is soft. There is no mass.     Tenderness: There is no abdominal tenderness. There is no right CVA tenderness or left CVA tenderness.  Musculoskeletal:     Cervical back: Normal range of motion and neck supple.     Right lower leg: No edema.     Left lower leg: No edema.  Lymphadenopathy:     Cervical: No cervical adenopathy.  Skin:    General: Skin is warm and dry.     Findings: No rash.  Neurological:     Mental Status: She is alert.     Deep Tendon Reflexes: Reflexes are normal and symmetric.  Psychiatric:        Mood and Affect: Mood normal.           Assessment & Plan:   Problem List Items Addressed This Visit  Respiratory   Small pleural effusion    Reviewed UC records, lab results and studies in detail  Pt no longer feels sob at rest  Re check cxr today      Relevant Orders   DG Chest 2 View (Completed)     Genitourinary   CKD (chronic kidney disease) stage 4, GFR 15-29 ml/min (HCC)    In the setting of frequent utis  Taking macrobid currently for e choli uti  Will re check urine after finishing  Ref made to urology      Frequent UTI - Primary    Per pt more than 4 in past 6 mo  Reviewed hospital/UC  records, lab results and studies in detail   On macrobid for e coli infection  (prev keflex but cx showed resistance)  Plan to re check culture after finishing abx  Aware of renal insuff Ref made to urology  Disc avoidance of wet pad when able  and also inst to wipe front to back  Also keep up fluid intake       Relevant Orders   Ambulatory referral to Urology     Other   URINARY INCONTINENCE, MIXED    Bothersome Wearing pad /frequently wet may worsen freq utis Ref made to urology       Other Visit Diagnoses    Need for influenza vaccination       Relevant Orders   Flu Vaccine QUAD High Dose(Fluad) (Completed)

## 2020-05-14 ENCOUNTER — Telehealth: Payer: Self-pay | Admitting: Family Medicine

## 2020-05-14 DIAGNOSIS — J90 Pleural effusion, not elsewhere classified: Secondary | ICD-10-CM

## 2020-05-14 NOTE — Assessment & Plan Note (Signed)
Bothersome Wearing pad /frequently wet may worsen freq utis Ref made to urology

## 2020-05-14 NOTE — Telephone Encounter (Signed)
Referral for CT chest  Not emergent but asap please   Thanks!

## 2020-05-14 NOTE — Assessment & Plan Note (Signed)
In the setting of frequent utis  Taking macrobid currently for e choli uti  Will re check urine after finishing  Ref made to urology

## 2020-05-14 NOTE — Assessment & Plan Note (Addendum)
Reviewed UC records, lab results and studies in detail  Pt no longer feels sob at rest  Re check cxr today

## 2020-05-14 NOTE — Telephone Encounter (Signed)
-----   Message from Tammi Sou, [place] sent at 05/13/2020  4:53 PM EDT ----- Pt notified of xray results and Dr. Marliss Coots comments. Pt agrees with CT, pt said she would like to have it done in [place] if possible. I advise pt Dr. Glori Bickers would put order in and our [facility] will call to schedule appt

## 2020-05-14 NOTE — Assessment & Plan Note (Signed)
Per pt more than 4 in past 6 mo  Reviewed hospital/UC  records, lab results and studies in detail   On macrobid for e coli infection  (prev keflex but cx showed resistance)  Plan to re check culture after finishing abx  Aware of renal insuff Ref made to urology  Disc avoidance of wet pad when able and also inst to wipe front to back  Also keep up fluid intake

## 2020-05-16 ENCOUNTER — Other Ambulatory Visit: Payer: Self-pay | Admitting: Family Medicine

## 2020-05-16 NOTE — Telephone Encounter (Signed)
Name of Medication:xanax Name of Pharmacy:[facility] or Written Date and Quantity:04/06/20 #60 tabs 0 refills Last Office Visit and Type:Discuss recurring UTIs Next Office Visit and Type:none scheduled

## 2020-05-17 ENCOUNTER — Telehealth: Payer: Self-pay | Admitting: *Deleted

## 2020-05-17 NOTE — Telephone Encounter (Signed)
Message was left on voicemail wanting to know if Dr. Glori Bickers recommends that patient go ahead and get her covid vaccine now?  Call patient back with recommendation.

## 2020-05-17 NOTE — Telephone Encounter (Signed)
Patient is scheduled on Tuesday 05/24/20.

## 2020-05-17 NOTE — Telephone Encounter (Signed)
She will be 6 months out from her 2nd pfizer vaccine on 9/30  At that time she qualifies for a booster

## 2020-05-18 NOTE — Telephone Encounter (Signed)
Left VM letting pt know Dr. Tower's comments  

## 2020-05-19 DIAGNOSIS — R69 Illness, unspecified: Secondary | ICD-10-CM | POA: Diagnosis not present

## 2020-05-24 ENCOUNTER — Other Ambulatory Visit: Payer: Self-pay

## 2020-05-24 ENCOUNTER — Ambulatory Visit (INDEPENDENT_AMBULATORY_CARE_PROVIDER_SITE_OTHER)
Admission: RE | Admit: 2020-05-24 | Discharge: 2020-05-24 | Disposition: A | Discharge status: Home / Self Care | Payer: Medicare HMO | Source: Ambulatory Visit | Attending: Family Medicine | Admitting: Family Medicine

## 2020-05-24 DIAGNOSIS — J9811 Atelectasis: Secondary | ICD-10-CM | POA: Diagnosis not present

## 2020-05-24 DIAGNOSIS — I700 Atherosclerosis of aorta: Secondary | ICD-10-CM | POA: Diagnosis not present

## 2020-05-24 DIAGNOSIS — J90 Pleural effusion, not elsewhere classified: Secondary | ICD-10-CM | POA: Diagnosis not present

## 2020-05-24 DIAGNOSIS — I313 Pericardial effusion (noninflammatory): Secondary | ICD-10-CM | POA: Diagnosis not present

## 2020-05-25 ENCOUNTER — Telehealth: Payer: Self-pay | Admitting: Family Medicine

## 2020-05-25 DIAGNOSIS — J90 Pleural effusion, not elsewhere classified: Secondary | ICD-10-CM

## 2020-05-25 NOTE — Telephone Encounter (Signed)
-----   Message from Tammi Sou, [place] sent at 05/25/2020 12:12 PM EDT ----- Spoke with family and advise them of CT results. They do want to proceed with pulmo referral. They would like to see someone in [place] if possible. I advise them our [facility] will call to schedule appt. Family said pt is still SOB and wheezing. It's not any worse but it's about the same as when she was seen by PCP in the office

## 2020-05-26 ENCOUNTER — Other Ambulatory Visit: Payer: Self-pay

## 2020-05-26 ENCOUNTER — Encounter (HOSPITAL_COMMUNITY): Payer: Self-pay | Admitting: Emergency Medicine

## 2020-05-26 ENCOUNTER — Emergency Department (HOSPITAL_COMMUNITY)
Admission: EM | Admit: 2020-05-26 | Discharge: 2020-05-26 | Disposition: A | Discharge status: Home / Self Care | Payer: Medicare HMO | Attending: Emergency Medicine | Admitting: Emergency Medicine

## 2020-05-26 ENCOUNTER — Telehealth: Payer: Self-pay

## 2020-05-26 DIAGNOSIS — E1122 Type 2 diabetes mellitus with diabetic chronic kidney disease: Secondary | ICD-10-CM | POA: Diagnosis not present

## 2020-05-26 DIAGNOSIS — Z79899 Other long term (current) drug therapy: Secondary | ICD-10-CM | POA: Insufficient documentation

## 2020-05-26 DIAGNOSIS — I10 Essential (primary) hypertension: Secondary | ICD-10-CM

## 2020-05-26 DIAGNOSIS — Z7984 Long term (current) use of oral hypoglycemic drugs: Secondary | ICD-10-CM | POA: Insufficient documentation

## 2020-05-26 DIAGNOSIS — N184 Chronic kidney disease, stage 4 (severe): Secondary | ICD-10-CM | POA: Diagnosis not present

## 2020-05-26 DIAGNOSIS — Z7982 Long term (current) use of aspirin: Secondary | ICD-10-CM | POA: Diagnosis not present

## 2020-05-26 DIAGNOSIS — I129 Hypertensive chronic kidney disease with stage 1 through stage 4 chronic kidney disease, or unspecified chronic kidney disease: Secondary | ICD-10-CM | POA: Diagnosis not present

## 2020-05-26 DIAGNOSIS — Z8673 Personal history of transient ischemic attack (TIA), and cerebral infarction without residual deficits: Secondary | ICD-10-CM | POA: Diagnosis not present

## 2020-05-26 NOTE — Telephone Encounter (Signed)
Pt's caregiver Earnest Bailey called and said pt's blood pressure was 194/99 then 2 hours later when she checked her bp it is now 180/90. I sent patient over to access nurse.

## 2020-05-26 NOTE — Telephone Encounter (Signed)
[place] [facility] - Client TELEPHONE ADVICE RECORD AccessNurse Patient Name: Renee [name] Gender: Female DOB: [birth year] Age: 86 Y 2 M 10 D Return Phone Number: [phone] (Secondary) [place] [address] Client [place] [facility] - Client Client Site [place] Physician Glori Bickers, Roque Lias - MD Contact Type Call Who Is Calling Patient / Member / Family / Caregiver Call Type Triage / Clinical Caller Name [facility] Relationship To Patient Care [place] Return Phone Number [phone] (Secondary) Chief Complaint Blood Pressure High Reason for Call Symptomatic / Request for [place] states patient's blood pressure was 190/99. It is now down to 180/90. [place] [facility] Translation No Nurse Assessment Nurse: Rock Nephew, RN, Juliann Pulse Date/Time (Eastern Time): 05/26/2020 1:48:21 PM Confirm and document reason for call. If symptomatic, describe symptoms. ---Caller states patient's blood pressure was 190/99 and is now down to 180/90. Does the patient have any new or worsening symptoms? ---Yes Will a triage be completed? ---Yes Related visit to physician within the last 2 weeks? ---Yes Does the PT have any chronic conditions? (i.e. diabetes, asthma, this includes High risk factors for pregnancy, etc.) ---Yes List chronic conditions. ---HTN, Asthma, Diabetes, Is this a behavioral health or substance abuse call? ---No Guidelines Guideline Title Affirmed Question Affirmed Notes Nurse Date/Time (Eastern Time) Blood Pressure - High [1] Systolic BP >= 093 OR Diastolic >= 235 AND [5] cardiac or neurologic symptoms (e.g., chest pain, difficulty breathing, unsteady gait, blurred vision) Rock Nephew, RN, Juliann Pulse 05/26/2020 1:49:25 PM Disp. Time Eilene Ghazi Time) Disposition Final User 05/26/2020 1:52:36 PM Go to ED Now Yes Rock Nephew, RN, Juliann Pulse PLEASE NOTE: All timestamps  contained within this report are represented as [place] Standard Time. CONFIDENTIALTY NOTICE: This fax transmission is intended only for the addressee. It contains information that is legally privileged, confidential or otherwise protected from use or disclosure. If you are not the intended recipient, you are strictly prohibited from reviewing, disclosing, copying using or disseminating any of this information or taking any action in reliance on or regarding this information. If you have received this fax in error, please notify us immediately by telephone so that we can arrange for its return to [place]. Phone: [phone], Toll-Free: [phone], Fax: [phone] Page: 2 of 2 Call Id: [id number] [place] Disagree/Comply Comply Caller Understands Yes PreDisposition Call Doctor Care Advice Given Per Guideline GO TO ED NOW: * You need to be seen in the Emergency Department. * Another adult should drive. CARE ADVICE given per High Blood Pressure (Adult) guideline. Referrals GO TO FACILITY OTHER - SPECIFY

## 2020-05-26 NOTE — Discharge Instructions (Addendum)
Renee [name]'s blood pressures were normal in the emergency department for multiple readings. She has a normal exam.  No blood work or imagining is needed at this time.  Please follow up with primary care doctor as soon as possible for symptom recheck. Please continue home medications.  Return to the emergency department for any new or worsening symptoms

## 2020-05-26 NOTE — ED Provider Notes (Signed)
[facility] EMERGENCY DEPARTMENT Provider Note   CSN: [id number] Arrival date & time: 05/26/20  1514     History Chief Complaint  Patient presents with  . Hypertension    Renee [name] is a 86 y.o. female with past medical history significant for type 2 diabetes, hypertension, hyperlipidemia, osteoarthritis, osteopenia, renal insufficiency, CVA. Not anticoagulated  HPI Patient presents to emergency department today with chief complaint of hypertension x 1 day. Patient states she has a caregiver that checks her blood pressure every day.  At 2 PM her blood pressure was 208/99.  They called her doctor who recommended she be evaluated in the emergency department.  At that time patient was asymptomatic.  She is compliant with her hypertension medications.  She takes them in the morning and at night, has not missed any doses.  She has history of UTIs, denies any urinary complaints. Also denies any falls or head injuries.    Past Medical History:  Diagnosis Date  . Angioedema    possibly from voltaren  . Bronchopneumonia 12/11/2016  . Degenerative disc disease   . Diabetes mellitus    type II  . Hyperlipidemia   . Hypertension   . LVH (left ventricular hypertrophy)    and atrial enlargement by echo in past with nl EF  . Nasal pruritis   . Osteoarthritis   . Osteopenia   . Renal insufficiency   . Sleep apnea   . Stroke [facility]) 05/2010   Small vessel sobcortical (in [place] trial) with Dr Leonie Man, residual L hemiparesis  . Vitamin B 12 deficiency 04/08    Patient Active Problem List   Diagnosis Date Noted  . Pleural effusion 05/13/2020  . Frequent UTI 05/13/2020  . Dizziness 02/12/2020  . Normocytic anemia 02/12/2020  . Fall involving sidewalk curb 11/29/2019  . Contusion of back 11/27/2019  . Hyperlipidemia associated with type 2 diabetes mellitus ([place])   . Aortic atherosclerosis ([facility]) 07/13/2019  . H/O sepsis 07/01/2019  . CKD (chronic kidney disease) stage 4, GFR 15-29  ml/min ([facility]) 07/01/2019  . Lower abdominal pain 05/11/2019  . Routine general medical examination at a health care facility 03/30/2019  . External hemorrhoid 01/17/2018  . History of colitis 01/17/2018  . Generalized weakness 12/24/2016  . Diabetic retinopathy ([place]) 12/11/2016  . History of CVA (cerebrovascular accident) 12/11/2016  . Estrogen deficiency 08/30/2015  . Encounter for Medicare annual wellness exam 04/17/2013  . Chest wall pain 04/07/2013  . Mobility impaired 06/26/2011  . History of retinal detachment 01/10/2011  . Sleep apnea 11/28/2010  . Anxiety and depression 08/25/2010  . Hemiplegia, late effect of cerebrovascular disease ([place]) 07/05/2010  . POSTHERPETIC NEURALGIA 11/09/2009  . Renal insufficiency 06/29/2008  . Chronic back pain 01/26/2008  . EDEMA 01/26/2008  . B12 deficiency 01/10/2007  . Type 2 diabetes, controlled, with retinopathy ([place]) 11/27/2006  . Essential hypertension 11/27/2006  . FIBROCYSTIC BREAST DISEASE 11/27/2006  . ROSACEA 11/27/2006  . OSTEOARTHRITIS 11/27/2006  . URINARY INCONTINENCE, MIXED 11/27/2006    Past Surgical History:  Procedure Laterality Date  . ABDOMINAL HYSTERECTOMY     BSO-fibroids  . APPENDECTOMY    . BACK SURGERY    . COLON SURGERY     due to punctured intestines  . EYE SURGERY     cataract extraction  . KNEE SURGERY     arthroscope  . PARS PLANA VITRECTOMY  07/31/2011   Procedure: PARS PLANA VITRECTOMY WITH 25 GAUGE;  Surgeon: [name], MD;  Location: [facility];  Service: Ophthalmology;  Laterality: Right;  REMOVAL OF SILICONE OIL AND LASER RIGHT EYE  . RETINAL DETACHMENT SURGERY  02/18/11   times 2  . SPINE SURGERY  08/09   spinal decompression surgery     OB History   No obstetric history on file.     Family History  Problem Relation Age of Onset  . COPD Brother   . Cancer Sister        brain tumor with hemmorhage  . Heart disease Sister        CAD    Social History   Tobacco Use  . Smoking  status: Never Smoker  . Smokeless tobacco: Never Used  Vaping Use  . Vaping Use: Never assessed  Substance Use Topics  . Alcohol use: No    Alcohol/week: 0.0 standard drinks  . Drug use: No    Home Medications Prior to Admission medications   Medication Sig Start Date End Date Taking? Authorizing Provider  acetaminophen (TYLENOL) 325 MG tablet Take 650 mg by mouth every 6 (six) hours as needed.    [provider]  albuterol (VENTOLIN HFA) 108 (90 Base) MCG/ACT inhaler Inhale 2 puffs into the lungs every 4 (four) hours as needed for wheezing or shortness of breath. 07/13/19   Tower, Wynelle Fanny, MD  ALPRAZolam [name]) 0.5 MG tablet TAKE 1/2 TO 1 TABLET BY MOUTH TWICE A DAY AS NEEDED FOR ANXIETY 05/16/20   Tower, Wynelle Fanny, MD  amLODipine (NORVASC) 10 MG tablet Take 1 tablet (10 mg total) by mouth daily. 09/30/18   Tower, Wynelle Fanny, MD  aspirin 325 MG tablet Take 325 mg by mouth daily.     [provider]  Blood Glucose Monitoring Suppl (ONE TOUCH ULTRA 2) w/Device KIT Check blood sugar once daily and as directed. Dx E11.9 08/27/18   Tower, Wynelle Fanny, MD  calcium-vitamin D (OSCAL WITH D) 500-200 MG-UNIT per tablet Take 1 tablet by mouth daily.     [provider]  Cholecalciferol (VITAMIN D) 1000 UNITS capsule Take 1,000 Units by mouth daily.     [provider]  docusate sodium (COLACE) 100 MG capsule Take 200 mg by mouth at bedtime.    [provider]  ferrous sulfate 325 (65 FE) MG tablet Take 325 mg by mouth 2 (two) times daily.    [provider]  gemfibrozil (LOPID) 600 MG tablet TAKE 1 TABLET BY MOUTH EVERY DAY Patient taking differently: Take 600 mg by mouth daily.  09/08/19   Tower, Wynelle Fanny, MD  glipiZIDE (GLUCOTROL XL) 2.5 MG 24 hr tablet TAKE 1 TABLET BY MOUTH EVERY DAY WITH BREAKFAST Patient taking differently: Take 2.5 mg by mouth daily with breakfast.  07/20/19   Tower, Wynelle Fanny, MD  hydrALAZINE (APRESOLINE) 100 MG tablet Take 100 mg by  mouth 3 (three) times daily.  08/30/14   [provider]  metoprolol succinate (TOPROL-XL) 25 MG 24 hr tablet Take 0.5 tablets (12.5 mg total) by mouth 2 (two) times daily. 09/30/18   Tower, Wynelle Fanny, MD  mirtazapine (REMERON) 15 MG tablet TAKE 1/2 TABLET BY MOUTH AT BEDTIME Patient taking differently: Take 7.5 mg by mouth at bedtime.  06/16/19   Tower, Wynelle Fanny, MD  Multiple Vitamin (MULTIVITAMIN) capsule Take 1 capsule by mouth daily.     [provider]  nitrofurantoin, macrocrystal-monohydrate, (MACROBID) 100 MG capsule Take 1 capsule (100 mg total) by mouth 2 (two) times daily. 05/09/20   Lamptey, Myrene Galas, MD  Olopatadine HCl (  PATADAY OP) Place 1 drop into both eyes daily.    [provider]  OneTouch Delica Lancets 66A MISC CHECK BLOOD SUGAR ONCE DAILY AND AS DIRECTED. DX E11.9 Patient taking differently: 1 Device by Other route daily.  10/23/19   Tower, Wynelle Fanny, MD  ONETOUCH ULTRA test strip CHECK BLOOD SUGAR ONCE DAILY AND AS DIRECTED. DX E11.9 04/05/20   Tower, Wynelle Fanny, MD  sertraline (ZOLOFT) 50 MG tablet TAKE 1 TABLET BY MOUTH EVERY DAY Patient taking differently: Take 50 mg by mouth daily.  09/08/19   Tower, Wynelle Fanny, MD  trandolapril (MAVIK) 4 MG tablet Take 1 tablet (4 mg total) by mouth daily. 09/30/18   Tower, Wynelle Fanny, MD  triamcinolone (NASACORT) 55 MCG/ACT AERO nasal inhaler Place 2 sprays into the nose daily. 03/28/20   [name], MD  vitamin B-12 (CYANOCOBALAMIN) 1000 MCG tablet Take 1,000 mcg by mouth daily.     [provider]    Allergies    Diclofenac sodium and Pioglitazone  Review of Systems   Review of Systems  All other systems are reviewed and are negative for acute change except as noted in the HPI.   Physical Exam Updated Vital Signs BP (!) 175/86 (BP Location: Right Arm)   Pulse 84   Temp 97.8 F (36.6 C) (Oral)   Resp 20   Ht 5' 2"  (1.575 m)   Wt 81.2 kg   SpO2 97%   BMI 32.74 kg/m   Physical Exam Vitals and  nursing note reviewed.  Constitutional:      General: She is not in acute distress.    Appearance: She is not ill-appearing.  HENT:     Head: Normocephalic and atraumatic.     Right Ear: Tympanic membrane and external ear normal.     Left Ear: Tympanic membrane and external ear normal.     Nose: Nose normal.     Mouth/Throat:     Mouth: Mucous membranes are moist.     Pharynx: Oropharynx is clear.  Eyes:     General: No scleral icterus.       Right eye: No discharge.        Left eye: No discharge.     Extraocular Movements: Extraocular movements intact.     Conjunctiva/sclera: Conjunctivae normal.     Pupils: Pupils are equal, round, and reactive to light.  Neck:     Vascular: No JVD.  Cardiovascular:     Rate and Rhythm: Normal rate and regular rhythm.     Pulses: Normal pulses.          Radial pulses are 2+ on the right side and 2+ on the left side.     Heart sounds: Normal heart sounds.  Pulmonary:     Comments: Lungs clear to auscultation in all fields. Symmetric chest rise. No wheezing, rales, or rhonchi. Abdominal:     Comments: Abdomen is soft, non-distended, and non-tender in all quadrants. No rigidity, no guarding. No peritoneal signs.  Musculoskeletal:        General: Normal range of motion.     Cervical back: Normal range of motion.  Skin:    General: Skin is warm and dry.     Capillary Refill: Capillary refill takes less than 2 seconds.  Neurological:     Mental Status: She is oriented to person, place, and time.     GCS: GCS eye subscore is 4. GCS verbal subscore is 5. GCS motor subscore is 6.  Comments: Speech is slurred and at baseline per family.Marland Kitchen Speech is goal oriented and she does follow commands. CN 2-12 grossly intact. Minimal left sided mouth droop also baseline per family. Decreased ROM of left upper extremity from previous stroke. Grip strength 4/5 left upper extremity and 5/5 on right upper extremity. Bilateral lower extremities with 5/5  strength. Sensation normal to light and sharp touch Moves extremities without ataxia, coordination intact Normal finger to nose and rapid alternating movements  Psychiatric:        Behavior: Behavior normal.     ED Results / Procedures / Treatments   Labs (all labs ordered are listed, but only abnormal results are displayed) Labs Reviewed - No data to display  EKG None  Radiology No results found.  Procedures Procedures (including critical care time)  Medications Ordered in ED Medications - No data to display  ED Course  I have reviewed the triage vital signs and the nursing notes.  Pertinent labs & imaging results that were available during my care of the patient were reviewed by me and considered in my medical decision making (see chart for details). Vitals:   05/26/20 1529 05/26/20 1800 05/26/20 1830 05/26/20 1900  BP:  (!) 177/79 (!) 167/78 (!) 179/82  Pulse:  68 69 69  Resp:      Temp:      TempSrc:      SpO2:  95% 96% 97%  Weight: 81.2 kg     Height: 5' 2"  (1.575 m)         MDM Rules/Calculators/A&P                          History provided by patient with additional history obtained from chart review.    86 yo female presenting with elevated blood pressure reading at home 208/99 just prior to ED arrival. I have seen this patient in the past. She looks well today.  Neuro exam has normal left-sided upper extremity weakness which is her baseline from history of CVA.  Ambulatory with normal gait. No urinary symptoms, no abdominal tenderness.  Discussed patient with ED attending Dr. Lacinda Axon.  Given she was asymptomatic at the time of the hypertensive blood pressure reading and has had normal pressures here in the emergency department there is no indications for further emergent work-up.  Discussed this with patient and she is agreeable with plan of care to discharge home continue home medications.  Recommend she follow-up with PCP for symptom recheck.  The patient  appears reasonably screened and/or stabilized for discharge and I doubt any other medical condition or other [facility] requiring further screening, evaluation, or treatment in the ED at this time prior to discharge. The patient is safe for discharge with strict return precautions discussed.   Portions of this note were generated with [employer]. Dictation errors may occur despite best attempts at proofreading.    Final Clinical Impression(s) / ED Diagnoses Final diagnoses:  Hypertension, unspecified type    Rx / DC Orders ED Discharge Orders    None       Flint Melter 05/26/20 2119    Nat Christen, MD 05/26/20 [phone]

## 2020-05-26 NOTE — ED Triage Notes (Signed)
Pt states her bp was 208/99 today, she called her dr and was advised to come to ED to be evaluated.

## 2020-05-26 NOTE — Telephone Encounter (Signed)
It looks like she is in the ED now  I will watch for correspondence

## 2020-05-30 ENCOUNTER — Other Ambulatory Visit (INDEPENDENT_AMBULATORY_CARE_PROVIDER_SITE_OTHER): Payer: Medicare HMO

## 2020-05-30 ENCOUNTER — Other Ambulatory Visit: Payer: Self-pay

## 2020-05-30 DIAGNOSIS — R8290 Unspecified abnormal findings in urine: Secondary | ICD-10-CM

## 2020-05-30 DIAGNOSIS — N390 Urinary tract infection, site not specified: Secondary | ICD-10-CM | POA: Diagnosis not present

## 2020-05-30 DIAGNOSIS — N3946 Mixed incontinence: Secondary | ICD-10-CM | POA: Diagnosis not present

## 2020-05-30 LAB — POC URINALSYSI DIPSTICK (AUTOMATED)
Bilirubin, UA: NEGATIVE
Blood, UA: NEGATIVE
Glucose, UA: NEGATIVE
Ketones, UA: NEGATIVE
Nitrite, UA: POSITIVE
Protein, UA: POSITIVE — AB
Spec Grav, UA: 1.015 (ref 1.010–1.025)
Urobilinogen, UA: 0.2 E.U./dL
pH, UA: 6 (ref 5.0–8.0)

## 2020-06-01 ENCOUNTER — Ambulatory Visit: Payer: Medicare HMO | Admitting: Family Medicine

## 2020-06-02 LAB — URINE CULTURE
MICRO NUMBER:: 11055842
SPECIMEN QUALITY:: ADEQUATE

## 2020-06-03 DIAGNOSIS — R69 Illness, unspecified: Secondary | ICD-10-CM | POA: Diagnosis not present

## 2020-06-06 ENCOUNTER — Other Ambulatory Visit: Payer: Self-pay

## 2020-06-06 DIAGNOSIS — B9620 Unspecified Escherichia coli [E. coli] as the cause of diseases classified elsewhere: Secondary | ICD-10-CM

## 2020-06-06 DIAGNOSIS — N390 Urinary tract infection, site not specified: Secondary | ICD-10-CM

## 2020-06-06 MED ORDER — NITROFURANTOIN MONOHYD MACRO 100 MG PO CAPS: 100.0000 mg | ORAL_CAPSULE | Freq: Two times a day (BID) | ORAL | 0 refills | Status: DC

## 2020-06-07 ENCOUNTER — Encounter (HOSPITAL_COMMUNITY): Payer: Self-pay | Admitting: Emergency Medicine

## 2020-06-07 ENCOUNTER — Inpatient Hospital Stay (HOSPITAL_COMMUNITY)
Admission: EM | Admit: 2020-06-07 | Discharge: 2020-06-11 | DRG: 871 | Disposition: A | Discharge status: Home Health | Payer: Medicare HMO | Attending: Internal Medicine | Admitting: Internal Medicine

## 2020-06-07 ENCOUNTER — Emergency Department (HOSPITAL_COMMUNITY): Payer: Medicare HMO

## 2020-06-07 ENCOUNTER — Telehealth: Payer: Medicare HMO | Admitting: Family Medicine

## 2020-06-07 ENCOUNTER — Inpatient Hospital Stay (HOSPITAL_COMMUNITY): Payer: Medicare HMO

## 2020-06-07 ENCOUNTER — Other Ambulatory Visit: Payer: Self-pay

## 2020-06-07 DIAGNOSIS — I69354 Hemiplegia and hemiparesis following cerebral infarction affecting left non-dominant side: Secondary | ICD-10-CM

## 2020-06-07 DIAGNOSIS — G4730 Sleep apnea, unspecified: Secondary | ICD-10-CM | POA: Diagnosis present

## 2020-06-07 DIAGNOSIS — M2550 Pain in unspecified joint: Secondary | ICD-10-CM | POA: Diagnosis not present

## 2020-06-07 DIAGNOSIS — F32A Depression, unspecified: Secondary | ICD-10-CM | POA: Diagnosis present

## 2020-06-07 DIAGNOSIS — R69 Illness, unspecified: Secondary | ICD-10-CM | POA: Diagnosis not present

## 2020-06-07 DIAGNOSIS — G8190 Hemiplegia, unspecified affecting unspecified side: Secondary | ICD-10-CM | POA: Diagnosis not present

## 2020-06-07 DIAGNOSIS — R0902 Hypoxemia: Secondary | ICD-10-CM | POA: Diagnosis not present

## 2020-06-07 DIAGNOSIS — Z7984 Long term (current) use of oral hypoglycemic drugs: Secondary | ICD-10-CM

## 2020-06-07 DIAGNOSIS — N1832 Chronic kidney disease, stage 3b: Secondary | ICD-10-CM | POA: Diagnosis present

## 2020-06-07 DIAGNOSIS — E1165 Type 2 diabetes mellitus with hyperglycemia: Secondary | ICD-10-CM | POA: Diagnosis present

## 2020-06-07 DIAGNOSIS — E538 Deficiency of other specified B group vitamins: Secondary | ICD-10-CM | POA: Diagnosis present

## 2020-06-07 DIAGNOSIS — A419 Sepsis, unspecified organism: Principal | ICD-10-CM | POA: Diagnosis present

## 2020-06-07 DIAGNOSIS — R52 Pain, unspecified: Secondary | ICD-10-CM | POA: Diagnosis not present

## 2020-06-07 DIAGNOSIS — M8580 Other specified disorders of bone density and structure, unspecified site: Secondary | ICD-10-CM | POA: Diagnosis present

## 2020-06-07 DIAGNOSIS — Z6831 Body mass index (BMI) 31.0-31.9, adult: Secondary | ICD-10-CM

## 2020-06-07 DIAGNOSIS — Z87440 Personal history of urinary (tract) infections: Secondary | ICD-10-CM | POA: Diagnosis not present

## 2020-06-07 DIAGNOSIS — N390 Urinary tract infection, site not specified: Secondary | ICD-10-CM | POA: Diagnosis not present

## 2020-06-07 DIAGNOSIS — Z8701 Personal history of pneumonia (recurrent): Secondary | ICD-10-CM

## 2020-06-07 DIAGNOSIS — Z90710 Acquired absence of both cervix and uterus: Secondary | ICD-10-CM

## 2020-06-07 DIAGNOSIS — E1122 Type 2 diabetes mellitus with diabetic chronic kidney disease: Secondary | ICD-10-CM | POA: Diagnosis present

## 2020-06-07 DIAGNOSIS — Z886 Allergy status to analgesic agent status: Secondary | ICD-10-CM

## 2020-06-07 DIAGNOSIS — D631 Anemia in chronic kidney disease: Secondary | ICD-10-CM | POA: Diagnosis present

## 2020-06-07 DIAGNOSIS — B9620 Unspecified Escherichia coli [E. coli] as the cause of diseases classified elsewhere: Secondary | ICD-10-CM | POA: Diagnosis present

## 2020-06-07 DIAGNOSIS — J90 Pleural effusion, not elsewhere classified: Secondary | ICD-10-CM | POA: Diagnosis not present

## 2020-06-07 DIAGNOSIS — M5489 Other dorsalgia: Secondary | ICD-10-CM | POA: Diagnosis not present

## 2020-06-07 DIAGNOSIS — E860 Dehydration: Secondary | ICD-10-CM | POA: Diagnosis present

## 2020-06-07 DIAGNOSIS — Z9889 Other specified postprocedural states: Secondary | ICD-10-CM

## 2020-06-07 DIAGNOSIS — F419 Anxiety disorder, unspecified: Secondary | ICD-10-CM | POA: Diagnosis present

## 2020-06-07 DIAGNOSIS — J9601 Acute respiratory failure with hypoxia: Secondary | ICD-10-CM | POA: Diagnosis present

## 2020-06-07 DIAGNOSIS — J189 Pneumonia, unspecified organism: Secondary | ICD-10-CM

## 2020-06-07 DIAGNOSIS — I129 Hypertensive chronic kidney disease with stage 1 through stage 4 chronic kidney disease, or unspecified chronic kidney disease: Secondary | ICD-10-CM | POA: Diagnosis present

## 2020-06-07 DIAGNOSIS — R069 Unspecified abnormalities of breathing: Secondary | ICD-10-CM | POA: Diagnosis not present

## 2020-06-07 DIAGNOSIS — R5381 Other malaise: Secondary | ICD-10-CM | POA: Diagnosis not present

## 2020-06-07 DIAGNOSIS — E669 Obesity, unspecified: Secondary | ICD-10-CM | POA: Diagnosis present

## 2020-06-07 DIAGNOSIS — R509 Fever, unspecified: Secondary | ICD-10-CM | POA: Diagnosis not present

## 2020-06-07 DIAGNOSIS — H9190 Unspecified hearing loss, unspecified ear: Secondary | ICD-10-CM | POA: Diagnosis present

## 2020-06-07 DIAGNOSIS — Z7401 Bed confinement status: Secondary | ICD-10-CM | POA: Diagnosis not present

## 2020-06-07 DIAGNOSIS — R918 Other nonspecific abnormal finding of lung field: Secondary | ICD-10-CM | POA: Diagnosis not present

## 2020-06-07 DIAGNOSIS — Z66 Do not resuscitate: Secondary | ICD-10-CM | POA: Diagnosis present

## 2020-06-07 DIAGNOSIS — E785 Hyperlipidemia, unspecified: Secondary | ICD-10-CM | POA: Diagnosis present

## 2020-06-07 DIAGNOSIS — I213 ST elevation (STEMI) myocardial infarction of unspecified site: Secondary | ICD-10-CM | POA: Diagnosis not present

## 2020-06-07 DIAGNOSIS — Z20822 Contact with and (suspected) exposure to covid-19: Secondary | ICD-10-CM | POA: Diagnosis not present

## 2020-06-07 DIAGNOSIS — E861 Hypovolemia: Secondary | ICD-10-CM | POA: Diagnosis present

## 2020-06-07 DIAGNOSIS — R0602 Shortness of breath: Secondary | ICD-10-CM | POA: Diagnosis not present

## 2020-06-07 DIAGNOSIS — Z79899 Other long term (current) drug therapy: Secondary | ICD-10-CM

## 2020-06-07 DIAGNOSIS — M1990 Unspecified osteoarthritis, unspecified site: Secondary | ICD-10-CM | POA: Diagnosis present

## 2020-06-07 DIAGNOSIS — Z7409 Other reduced mobility: Secondary | ICD-10-CM | POA: Diagnosis present

## 2020-06-07 DIAGNOSIS — Z7982 Long term (current) use of aspirin: Secondary | ICD-10-CM

## 2020-06-07 DIAGNOSIS — Z8249 Family history of ischemic heart disease and other diseases of the circulatory system: Secondary | ICD-10-CM

## 2020-06-07 DIAGNOSIS — Z888 Allergy status to other drugs, medicaments and biological substances status: Secondary | ICD-10-CM

## 2020-06-07 HISTORY — PX: IR THORACENTESIS ASP PLEURAL SPACE W/IMG GUIDE: IMG5380

## 2020-06-07 LAB — CBC WITH DIFFERENTIAL/PLATELET
Abs Immature Granulocytes: 0.1 10*3/uL — ABNORMAL HIGH (ref 0.00–0.07)
Basophils Absolute: 0 10*3/uL (ref 0.0–0.1)
Basophils Relative: 0 %
Eosinophils Absolute: 0 10*3/uL (ref 0.0–0.5)
Eosinophils Relative: 0 %
HCT: 42 % (ref 36.0–46.0)
Hemoglobin: 12.6 g/dL (ref 12.0–15.0)
Immature Granulocytes: 1 %
Lymphocytes Relative: 2 %
Lymphs Abs: 0.3 10*3/uL — ABNORMAL LOW (ref 0.7–4.0)
MCH: 27.3 pg (ref 26.0–34.0)
MCHC: 30 g/dL (ref 30.0–36.0)
MCV: 91.1 fL (ref 80.0–100.0)
Monocytes Absolute: 0.3 10*3/uL (ref 0.1–1.0)
Monocytes Relative: 2 %
Neutro Abs: 18.3 10*3/uL — ABNORMAL HIGH (ref 1.7–7.7)
Neutrophils Relative %: 95 %
Platelets: 179 10*3/uL (ref 150–400)
RBC: 4.61 MIL/uL (ref 3.87–5.11)
RDW: 13.7 % (ref 11.5–15.5)
WBC: 19.1 10*3/uL — ABNORMAL HIGH (ref 4.0–10.5)
nRBC: 0 % (ref 0.0–0.2)

## 2020-06-07 LAB — APTT: aPTT: 28 seconds (ref 24–36)

## 2020-06-07 LAB — PROTEIN, PLEURAL OR PERITONEAL FLUID: Total protein, fluid: <3 g/dL

## 2020-06-07 LAB — GLUCOSE, PLEURAL OR PERITONEAL FLUID: Glucose, Fluid: 293 mg/dL

## 2020-06-07 LAB — COMPREHENSIVE METABOLIC PANEL
ALT: 18 U/L (ref 0–44)
AST: 19 U/L (ref 15–41)
Albumin: 3.5 g/dL (ref 3.5–5.0)
Alkaline Phosphatase: 76 U/L (ref 38–126)
Anion gap: 12 (ref 5–15)
BUN: 21 mg/dL (ref 8–23)
CO2: 23 mmol/L (ref 22–32)
Calcium: 9.1 mg/dL (ref 8.9–10.3)
Chloride: 104 mmol/L (ref 98–111)
Creatinine, Ser: 1.47 mg/dL — ABNORMAL HIGH (ref 0.44–1.00)
GFR, Estimated: 31 mL/min — ABNORMAL LOW (ref >60)
Glucose, Bld: 282 mg/dL — ABNORMAL HIGH (ref 70–99)
Potassium: 4.1 mmol/L (ref 3.5–5.1)
Sodium: 139 mmol/L (ref 135–145)
Total Bilirubin: 0.9 mg/dL (ref 0.3–1.2)
Total Protein: 5.8 g/dL — ABNORMAL LOW (ref 6.5–8.1)

## 2020-06-07 LAB — ALBUMIN, PLEURAL OR PERITONEAL FLUID: Albumin, Fluid: 2.1 g/dL

## 2020-06-07 LAB — LACTATE DEHYDROGENASE, PLEURAL OR PERITONEAL FLUID: LD, Fluid: 62 U/L — ABNORMAL HIGH (ref 3–23)

## 2020-06-07 LAB — BODY FLUID CELL COUNT WITH DIFFERENTIAL
Eos, Fluid: 0 %
Lymphs, Fluid: 21 %
Monocyte-Macrophage-Serous Fluid: 28 % — ABNORMAL LOW (ref 50–90)
Neutrophil Count, Fluid: 51 % — ABNORMAL HIGH (ref 0–25)
Total Nucleated Cell Count, Fluid: 1550 cu mm — ABNORMAL HIGH (ref 0–1000)

## 2020-06-07 LAB — BRAIN NATRIURETIC PEPTIDE: B Natriuretic Peptide: 110.1 pg/mL — ABNORMAL HIGH (ref 0.0–100.0)

## 2020-06-07 LAB — RESPIRATORY PANEL BY RT PCR (FLU A&B, COVID)
Influenza A by PCR: NEGATIVE
Influenza B by PCR: NEGATIVE
SARS Coronavirus 2 by RT PCR: NEGATIVE

## 2020-06-07 LAB — LACTIC ACID, PLASMA: Lactic Acid, Venous: 1.2 mmol/L (ref 0.5–1.9)

## 2020-06-07 LAB — CBG MONITORING, ED: Glucose-Capillary: 268 mg/dL — ABNORMAL HIGH (ref 70–99)

## 2020-06-07 LAB — MRSA PCR SCREENING: MRSA by PCR: NEGATIVE

## 2020-06-07 LAB — GLUCOSE, CAPILLARY: Glucose-Capillary: 220 mg/dL — ABNORMAL HIGH (ref 70–99)

## 2020-06-07 LAB — GRAM STAIN

## 2020-06-07 LAB — PROTIME-INR
INR: 1 (ref 0.8–1.2)
Prothrombin Time: 13 seconds (ref 11.4–15.2)

## 2020-06-07 LAB — LACTATE DEHYDROGENASE: LDH: 122 U/L (ref 98–192)

## 2020-06-07 MED ORDER — ONDANSETRON HCL 4 MG/2ML IJ SOLN: 4.0000 mg | Freq: Four times a day (QID) | INTRAMUSCULAR | Status: DC | PRN

## 2020-06-07 MED ORDER — SODIUM CHLORIDE 0.9 % IV SOLN: 500.0000 mg | INTRAVENOUS | Status: DC

## 2020-06-07 MED ORDER — SODIUM CHLORIDE 0.9 % IV SOLN: 2.0000 g | INTRAVENOUS | Status: DC

## 2020-06-07 MED ORDER — ACETAMINOPHEN 325 MG PO TABS
650.0000 mg | ORAL_TABLET | Freq: Once | ORAL | Status: AC
  Administered 2020-06-07: 650 mg via ORAL
  Filled 2020-06-07: qty 2

## 2020-06-07 MED ORDER — SODIUM CHLORIDE 0.9 % IV SOLN: INTRAVENOUS | Status: AC

## 2020-06-07 MED ORDER — SODIUM CHLORIDE 0.9 % IV SOLN
2.0000 g | INTRAVENOUS | Status: DC
  Administered 2020-06-08 – 2020-06-10 (×3): 2 g via INTRAVENOUS
  Filled 2020-06-07 (×4): qty 20

## 2020-06-07 MED ORDER — TRIAMCINOLONE ACETONIDE 55 MCG/ACT NA AERO
2.0000 | INHALATION_SPRAY | Freq: Every day | NASAL | Status: DC
  Administered 2020-06-08 – 2020-06-11 (×4): 2 via NASAL
  Filled 2020-06-07 (×3): qty 21.6
  Filled 2020-06-07: qty 10.8

## 2020-06-07 MED ORDER — IPRATROPIUM-ALBUTEROL 0.5-2.5 (3) MG/3ML IN SOLN
3.0000 mL | Freq: Three times a day (TID) | RESPIRATORY_TRACT | Status: DC
  Administered 2020-06-08: 3 mL via RESPIRATORY_TRACT
  Filled 2020-06-07: qty 3

## 2020-06-07 MED ORDER — SERTRALINE HCL 50 MG PO TABS
50.0000 mg | ORAL_TABLET | Freq: Every day | ORAL | Status: DC
  Administered 2020-06-08 – 2020-06-11 (×4): 50 mg via ORAL
  Filled 2020-06-07 (×4): qty 1

## 2020-06-07 MED ORDER — SODIUM CHLORIDE 0.9 % IV SOLN
500.0000 mg | Freq: Once | INTRAVENOUS | Status: AC
  Administered 2020-06-07: 500 mg via INTRAVENOUS
  Filled 2020-06-07: qty 500

## 2020-06-07 MED ORDER — ASPIRIN 325 MG PO TABS
325.0000 mg | ORAL_TABLET | Freq: Every day | ORAL | Status: DC
  Administered 2020-06-08 – 2020-06-11 (×4): 325 mg via ORAL
  Filled 2020-06-07 (×4): qty 1

## 2020-06-07 MED ORDER — DOCUSATE SODIUM 100 MG PO CAPS
200.0000 mg | ORAL_CAPSULE | Freq: Every day | ORAL | Status: DC
  Administered 2020-06-07 – 2020-06-10 (×4): 200 mg via ORAL
  Filled 2020-06-07 (×4): qty 2

## 2020-06-07 MED ORDER — ALBUTEROL SULFATE (2.5 MG/3ML) 0.083% IN NEBU: 2.5000 mg | INHALATION_SOLUTION | Freq: Once | RESPIRATORY_TRACT | Status: DC

## 2020-06-07 MED ORDER — SODIUM CHLORIDE 0.9 % IV SOLN
1.0000 g | Freq: Once | INTRAVENOUS | Status: AC
  Administered 2020-06-07: 1 g via INTRAVENOUS
  Filled 2020-06-07: qty 10

## 2020-06-07 MED ORDER — PREDNISONE 20 MG PO TABS
40.0000 mg | ORAL_TABLET | Freq: Every day | ORAL | Status: DC
  Administered 2020-06-08: 40 mg via ORAL
  Filled 2020-06-07: qty 2

## 2020-06-07 MED ORDER — LIDOCAINE HCL 1 % IJ SOLN
INTRAMUSCULAR | Status: AC | PRN
  Administered 2020-06-07: 10 mL

## 2020-06-07 MED ORDER — ALPRAZOLAM 0.5 MG PO TABS: 0.2500 mg | ORAL_TABLET | Freq: Two times a day (BID) | ORAL | Status: DC | PRN

## 2020-06-07 MED ORDER — ACETAMINOPHEN 650 MG RE SUPP: 650.0000 mg | Freq: Four times a day (QID) | RECTAL | Status: DC | PRN

## 2020-06-07 MED ORDER — METOPROLOL TARTRATE 12.5 MG HALF TABLET
12.5000 mg | ORAL_TABLET | Freq: Two times a day (BID) | ORAL | Status: DC
  Administered 2020-06-07 – 2020-06-11 (×9): 12.5 mg via ORAL
  Filled 2020-06-07 (×9): qty 1

## 2020-06-07 MED ORDER — OLOPATADINE HCL 0.1 % OP SOLN
1.0000 [drp] | Freq: Every day | OPHTHALMIC | Status: DC
  Administered 2020-06-07 – 2020-06-11 (×5): 1 [drp] via OPHTHALMIC
  Filled 2020-06-07 (×2): qty 5

## 2020-06-07 MED ORDER — TRANDOLAPRIL 1 MG PO TABS
4.0000 mg | ORAL_TABLET | Freq: Every day | ORAL | Status: DC
  Administered 2020-06-08 – 2020-06-11 (×4): 4 mg via ORAL
  Filled 2020-06-07: qty 1
  Filled 2020-06-07 (×4): qty 4

## 2020-06-07 MED ORDER — INSULIN ASPART 100 UNIT/ML ~~LOC~~ SOLN
0.0000 [IU] | Freq: Three times a day (TID) | SUBCUTANEOUS | Status: DC
  Administered 2020-06-07: 5 [IU] via SUBCUTANEOUS
  Administered 2020-06-08 (×2): 3 [IU] via SUBCUTANEOUS
  Administered 2020-06-08: 1 [IU] via SUBCUTANEOUS
  Administered 2020-06-09: 7 [IU] via SUBCUTANEOUS
  Administered 2020-06-09: 1 [IU] via SUBCUTANEOUS
  Administered 2020-06-09: 3 [IU] via SUBCUTANEOUS
  Administered 2020-06-10: 1 [IU] via SUBCUTANEOUS
  Administered 2020-06-10: 5 [IU] via SUBCUTANEOUS
  Administered 2020-06-10: 2 [IU] via SUBCUTANEOUS
  Administered 2020-06-11: 5 [IU] via SUBCUTANEOUS
  Administered 2020-06-11: 1 [IU] via SUBCUTANEOUS

## 2020-06-07 MED ORDER — ACETAMINOPHEN 325 MG PO TABS: 650.0000 mg | ORAL_TABLET | Freq: Four times a day (QID) | ORAL | Status: DC | PRN

## 2020-06-07 MED ORDER — HEPARIN SODIUM (PORCINE) 5000 UNIT/ML IJ SOLN
5000.0000 [IU] | Freq: Two times a day (BID) | INTRAMUSCULAR | Status: DC
  Administered 2020-06-07 – 2020-06-11 (×8): 5000 [IU] via SUBCUTANEOUS
  Filled 2020-06-07 (×8): qty 1

## 2020-06-07 MED ORDER — IPRATROPIUM-ALBUTEROL 0.5-2.5 (3) MG/3ML IN SOLN
3.0000 mL | Freq: Four times a day (QID) | RESPIRATORY_TRACT | Status: DC
  Administered 2020-06-07: 3 mL via RESPIRATORY_TRACT
  Filled 2020-06-07: qty 3

## 2020-06-07 MED ORDER — GEMFIBROZIL 600 MG PO TABS
600.0000 mg | ORAL_TABLET | Freq: Every day | ORAL | Status: DC
  Administered 2020-06-08 – 2020-06-11 (×4): 600 mg via ORAL
  Filled 2020-06-07 (×5): qty 1

## 2020-06-07 MED ORDER — IPRATROPIUM-ALBUTEROL 0.5-2.5 (3) MG/3ML IN SOLN: 3.0000 mL | Freq: Four times a day (QID) | RESPIRATORY_TRACT | Status: DC | PRN

## 2020-06-07 MED ORDER — MIRTAZAPINE 15 MG PO TABS
7.5000 mg | ORAL_TABLET | Freq: Every day | ORAL | Status: DC
  Administered 2020-06-07 – 2020-06-10 (×4): 7.5 mg via ORAL
  Filled 2020-06-07 (×5): qty 1

## 2020-06-07 MED ORDER — VITAMIN D 25 MCG (1000 UNIT) PO TABS
1000.0000 [IU] | ORAL_TABLET | Freq: Every day | ORAL | Status: DC
  Administered 2020-06-08 – 2020-06-11 (×4): 1000 [IU] via ORAL
  Filled 2020-06-07 (×6): qty 1

## 2020-06-07 MED ORDER — HYDRALAZINE HCL 50 MG PO TABS
100.0000 mg | ORAL_TABLET | Freq: Three times a day (TID) | ORAL | Status: DC
  Administered 2020-06-07 – 2020-06-11 (×11): 100 mg via ORAL
  Filled 2020-06-07 (×11): qty 2

## 2020-06-07 MED ORDER — LIDOCAINE HCL 1 % IJ SOLN
INTRAMUSCULAR | Status: AC
  Filled 2020-06-07: qty 20

## 2020-06-07 MED ORDER — FERROUS SULFATE 325 (65 FE) MG PO TABS
325.0000 mg | ORAL_TABLET | Freq: Two times a day (BID) | ORAL | Status: DC
  Administered 2020-06-07 – 2020-06-11 (×8): 325 mg via ORAL
  Filled 2020-06-07 (×8): qty 1

## 2020-06-07 MED ORDER — VITAMIN B-12 1000 MCG PO TABS
1000.0000 ug | ORAL_TABLET | Freq: Every day | ORAL | Status: DC
  Administered 2020-06-08 – 2020-06-11 (×4): 1000 ug via ORAL
  Filled 2020-06-07 (×4): qty 1

## 2020-06-07 MED ORDER — SODIUM CHLORIDE 0.9 % IV SOLN
500.0000 mg | INTRAVENOUS | Status: DC
  Administered 2020-06-08 – 2020-06-11 (×4): 500 mg via INTRAVENOUS
  Filled 2020-06-07 (×4): qty 500

## 2020-06-07 MED ORDER — CALCIUM CARBONATE-VITAMIN D 500-200 MG-UNIT PO TABS
1.0000 | ORAL_TABLET | Freq: Every day | ORAL | Status: DC
  Administered 2020-06-08 – 2020-06-11 (×4): 1 via ORAL
  Filled 2020-06-07 (×5): qty 1

## 2020-06-07 MED ORDER — ONDANSETRON HCL 4 MG PO TABS: 4.0000 mg | ORAL_TABLET | Freq: Four times a day (QID) | ORAL | Status: DC | PRN

## 2020-06-07 NOTE — Progress Notes (Signed)
Pt holding in the ED for a progressive care bed and seen on f/u rounds. No acute issues at this time. Care discussed with RN. Please contact me if there are any questions or concerns.

## 2020-06-07 NOTE — ED Notes (Addendum)
Patient transported to IR 

## 2020-06-07 NOTE — Plan of Care (Signed)

## 2020-06-07 NOTE — ED Notes (Signed)
Patient returned from IR

## 2020-06-07 NOTE — ED Triage Notes (Signed)
Patient BIB [facility] for shortness of breath that was unimproved with her inhaler. Patient states she received her COVID-19 vaccine booster 10 days ago and has felt unwell since then. Received 125mg  solumedrol and one duoneb en route. SpO2 on arrival to ED 95% on 2L [place], patient does not wear supplmental O2 at baseline. Patient alert and oriented at this time.

## 2020-06-07 NOTE — H&P (Signed)
History and Physical    Renee [name] LEX:[id number] DOB: Feb 28, 1929 DOA: 06/07/2020  PCP: Abner Greenspan, MD (Confirm with patient/family/NH records and if not entered, this has to be entered at [facility] point of entry) Patient coming from: Home  I have personally briefly reviewed patient's old medical records in [place]  Chief Complaint: Shortness of breath  HPI: Renee [name] is a 86 y.o. female with medical history significant of HTN, IIDM, CKD stage IIIb, HLD, depression/anxiety, frequent UTIs, presented with increasing shortness of breath.  Patient reported that her symptoms started about 1 week ago after she got her COVID booster shot, and symptom initially was dry coughing, and then became productive with yellowish sputum, has subjective fever but no chills at home.  Gradually, patient became shortness of breath, and cough became worse.  1 week ago patient developed dysuria which was up to 4th episode this year, went to see PCP who sent blood culture showed pansensitive E. coli and patient was started on Macrobid yesterday.  Patient also had history of left-sided pleural effusion which appears to be increasing, as shown on CT scan 2 weeks ago, and it appears that this pleural effusion was minimal last year.  Patient denied history of heart attack, she has CKD which had just been stable and following with nephrologist.  Patient denied coughing or choking after eating. ED Course: Fever of 101.1, patient was found to be very tachypneic, stabilized on 2 L oxygen.  Chest x-ray showed increasing left pleural effusion and lower field consolidation.  WBC 19.1 compared to 6.6 September this year, creatinine 1.4, glucose 282.  Lactic acid within normal limits  Review of Systems: As per HPI otherwise 14 point review of systems negative.    Past Medical History:  Diagnosis Date  . Angioedema    possibly from voltaren  . Bronchopneumonia 12/11/2016  . Degenerative disc disease   . Diabetes  mellitus    type II  . Hyperlipidemia   . Hypertension   . LVH (left ventricular hypertrophy)    and atrial enlargement by echo in past with nl EF  . Nasal pruritis   . Osteoarthritis   . Osteopenia   . Renal insufficiency   . Sleep apnea   . Stroke [facility]) 05/2010   Small vessel sobcortical (in [place] trial) with Dr Leonie Man, residual L hemiparesis  . Vitamin B 12 deficiency 04/08    Past Surgical History:  Procedure Laterality Date  . ABDOMINAL HYSTERECTOMY     BSO-fibroids  . APPENDECTOMY    . BACK SURGERY    . COLON SURGERY     due to punctured intestines  . EYE SURGERY     cataract extraction  . KNEE SURGERY     arthroscope  . PARS PLANA VITRECTOMY  07/31/2011   Procedure: PARS PLANA VITRECTOMY WITH 25 GAUGE;  Surgeon: [name], MD;  Location: [facility];  Service: Ophthalmology;  Laterality: Right;  REMOVAL OF SILICONE OIL AND LASER RIGHT EYE  . RETINAL DETACHMENT SURGERY  02/18/11   times 2  . SPINE SURGERY  08/09   spinal decompression surgery     reports that she has never smoked. She has never used smokeless tobacco. She reports that she does not drink alcohol and does not use drugs.  Allergies  Allergen Reactions  . Diclofenac Sodium Other (See Comments)    REACTION: angioedema  . Pioglitazone Swelling    Family History  Problem Relation Age of Onset  . COPD  Brother   . Cancer Sister        brain tumor with hemmorhage  . Heart disease Sister        CAD     Prior to Admission medications   Medication Sig Start Date End Date Taking? Authorizing Provider  acetaminophen (TYLENOL) 325 MG tablet Take 650 mg by mouth every 6 (six) hours as needed.    [provider]  albuterol (VENTOLIN HFA) 108 (90 Base) MCG/ACT inhaler Inhale 2 puffs into the lungs every 4 (four) hours as needed for wheezing or shortness of breath. 07/13/19   Tower, Wynelle Fanny, MD  ALPRAZolam [name]) 0.5 MG tablet TAKE 1/2 TO 1 TABLET BY MOUTH TWICE A DAY AS NEEDED FOR ANXIETY 05/16/20    Tower, Wynelle Fanny, MD  amLODipine (NORVASC) 10 MG tablet Take 1 tablet (10 mg total) by mouth daily. 09/30/18   Tower, Wynelle Fanny, MD  aspirin 325 MG tablet Take 325 mg by mouth daily.     [provider]  Blood Glucose Monitoring Suppl (ONE TOUCH ULTRA 2) w/Device KIT Check blood sugar once daily and as directed. Dx E11.9 08/27/18   Tower, Wynelle Fanny, MD  calcium-vitamin D (OSCAL WITH D) 500-200 MG-UNIT per tablet Take 1 tablet by mouth daily.     [provider]  Cholecalciferol (VITAMIN D) 1000 UNITS capsule Take 1,000 Units by mouth daily.     [provider]  docusate sodium (COLACE) 100 MG capsule Take 200 mg by mouth at bedtime.    [provider]  ferrous sulfate 325 (65 FE) MG tablet Take 325 mg by mouth 2 (two) times daily.    [provider]  gemfibrozil (LOPID) 600 MG tablet TAKE 1 TABLET BY MOUTH EVERY DAY Patient taking differently: Take 600 mg by mouth daily.  09/08/19   Tower, Wynelle Fanny, MD  glipiZIDE (GLUCOTROL XL) 2.5 MG 24 hr tablet TAKE 1 TABLET BY MOUTH EVERY DAY WITH BREAKFAST Patient taking differently: Take 2.5 mg by mouth daily with breakfast.  07/20/19   Tower, Wynelle Fanny, MD  hydrALAZINE (APRESOLINE) 100 MG tablet Take 100 mg by mouth 3 (three) times daily.  08/30/14   [provider]  metoprolol succinate (TOPROL-XL) 25 MG 24 hr tablet Take 0.5 tablets (12.5 mg total) by mouth 2 (two) times daily. 09/30/18   Tower, Wynelle Fanny, MD  mirtazapine (REMERON) 15 MG tablet TAKE 1/2 TABLET BY MOUTH AT BEDTIME Patient taking differently: Take 7.5 mg by mouth at bedtime.  06/16/19   Tower, Wynelle Fanny, MD  Multiple Vitamin (MULTIVITAMIN) capsule Take 1 capsule by mouth daily.     [provider]  nitrofurantoin, macrocrystal-monohydrate, (MACROBID) 100 MG capsule Take 1 capsule (100 mg total) by mouth 2 (two) times daily. 06/06/20   Tower, Wynelle Fanny, MD  Olopatadine HCl (PATADAY OP) Place 1 drop into both eyes daily.    [provider]    OneTouch Delica Lancets 01U MISC CHECK BLOOD SUGAR ONCE DAILY AND AS DIRECTED. DX E11.9 Patient taking differently: 1 Device by Other route daily.  10/23/19   Tower, Wynelle Fanny, MD  ONETOUCH ULTRA test strip CHECK BLOOD SUGAR ONCE DAILY AND AS DIRECTED. DX E11.9 04/05/20   Tower, Wynelle Fanny, MD  sertraline (ZOLOFT) 50 MG tablet TAKE 1 TABLET BY MOUTH EVERY DAY Patient taking differently: Take 50 mg by mouth daily.  09/08/19   Tower, Wynelle Fanny, MD  trandolapril (MAVIK) 4 MG tablet Take 1 tablet (4 mg total) by mouth daily.  09/30/18   Tower, Wynelle Fanny, MD  triamcinolone (NASACORT) 55 MCG/ACT AERO nasal inhaler Place 2 sprays into the nose daily. 03/28/20   [name], MD  vitamin B-12 (CYANOCOBALAMIN) 1000 MCG tablet Take 1,000 mcg by mouth daily.     [provider]    Physical Exam: Vitals:   06/07/20 1100 06/07/20 1157 06/07/20 1200 06/07/20 1230  BP:   138/83 (!) 156/70  Pulse: 92  98 95  Resp:   (!) 28 (!) 29  Temp:  (!) 101.1 F (38.4 C)    TempSrc:  Rectal    SpO2: 98%  97% 99%    Constitutional: NAD, calm, comfortable Vitals:   06/07/20 1100 06/07/20 1157 06/07/20 1200 06/07/20 1230  BP:   138/83 (!) 156/70  Pulse: 92  98 95  Resp:   (!) 28 (!) 29  Temp:  (!) 101.1 F (38.4 C)    TempSrc:  Rectal    SpO2: 98%  97% 99%   Eyes: PERRL, lids and conjunctivae normal ENMT: Mucous membranes are dry. posterior pharynx clear of any exudate or lesions.Normal dentition.  Neck: normal, supple, no masses, no thyromegaly Respiratory: clear to auscultation bilaterally, scattered wheezing and left lower field crackles.  Increasing respiratory effort, talking in broken sentences. No accessory muscle use.  Cardiovascular: Regular rate and rhythm, no murmurs / rubs / gallops. No extremity edema. 2+ pedal pulses. No carotid bruits.  Abdomen: no tenderness, no masses palpated. No hepatosplenomegaly. Bowel sounds positive.  Musculoskeletal: no clubbing / cyanosis. No joint deformity upper  and lower extremities. Good ROM, no contractures. Normal muscle tone.  Skin: no rashes, lesions, ulcers. No induration Neurologic: CN 2-12 grossly intact. Sensation intact, DTR normal. Strength 5/5 in all 4.  Psychiatric: Normal judgment and insight. Alert and oriented x 3. Normal mood.     Labs on Admission: I have personally reviewed following labs and imaging studies  CBC: Recent Labs  Lab 06/07/20 1145  WBC 19.1*  NEUTROABS 18.3*  HGB 12.6  HCT 42.0  MCV 91.1  PLT 532   Basic Metabolic Panel: Recent Labs  Lab 06/07/20 1145  NA 139  K 4.1  CL 104  CO2 23  GLUCOSE 282*  BUN 21  CREATININE 1.47*  CALCIUM 9.1   GFR: Estimated Creatinine Clearance: 25.6 mL/min (A) (by C-G formula based on SCr of 1.47 mg/dL (H)). Liver Function Tests: Recent Labs  Lab 06/07/20 1145  AST 19  ALT 18  ALKPHOS 76  BILITOT 0.9  PROT 5.8*  ALBUMIN 3.5   No results for input(s): LIPASE, AMYLASE in the last 168 hours. No results for input(s): AMMONIA in the last 168 hours. Coagulation Profile: No results for input(s): INR, PROTIME in the last 168 hours. Cardiac Enzymes: No results for input(s): CKTOTAL, CKMB, CKMBINDEX, TROPONINI in the last 168 hours. BNP (last 3 results) No results for input(s): PROBNP in the last 8760 hours. HbA1C: No results for input(s): HGBA1C in the last 72 hours. CBG: No results for input(s): GLUCAP in the last 168 hours. Lipid Profile: No results for input(s): CHOL, HDL, LDLCALC, TRIG, CHOLHDL, LDLDIRECT in the last 72 hours. Thyroid Function Tests: No results for input(s): TSH, T4TOTAL, FREET4, T3FREE, THYROIDAB in the last 72 hours. Anemia Panel: No results for input(s): VITAMINB12, FOLATE, FERRITIN, TIBC, IRON, RETICCTPCT in the last 72 hours. Urine analysis:    Component Value Date/Time   COLORURINE YELLOW 02/12/2020 [place] 02/12/2020 1423   LABSPEC 1.020 05/06/2020 1001  PHURINE 5.5 05/06/2020 1001   GLUCOSEU NEGATIVE  05/06/2020 1001   HGBUR TRACE (A) 05/06/2020 1001   HGBUR negative 03/27/2010 1358   BILIRUBINUR Negative 05/30/2020 1153   KETONESUR NEGATIVE 05/06/2020 1001   PROTEINUR Positive (A) 05/30/2020 1153   PROTEINUR 30 (A) 05/06/2020 1001   UROBILINOGEN 0.2 05/30/2020 1153   UROBILINOGEN 0.2 05/06/2020 1001   NITRITE Positive 05/30/2020 1153   NITRITE NEGATIVE 05/06/2020 1001   LEUKOCYTESUR Moderate (2+) (A) 05/30/2020 1153   LEUKOCYTESUR LARGE (A) 05/06/2020 1001    Radiological Exams on Admission: DG Chest 2 View  Result Date: 06/07/2020 CLINICAL DATA:  Shortness of breath. EXAM: CHEST - 2 VIEW COMPARISON:  05/13/2020 chest radiograph and prior. 05/24/2020 CT chest. FINDINGS: Left basilar consolidation and small to moderate left pleural effusion are more conspicuous than prior exam. Clear right lung. No pneumothorax. Partially obscured cardiomediastinal silhouette. Osseous structures are unchanged. IMPRESSION: Increased conspicuity of left basilar consolidation. Increased small to moderate left pleural effusion. Electronically Signed   By: Primitivo Gauze M.D.   On: 06/07/2020 10:12    EKG: Independently reviewed.  Sinus arrhythmia, no acute ST-T changes  Assessment/Plan Active Problems:   Sepsis ([place])  (please populate well all problems here in Problem List. (For example, if patient is on BP meds at home and you resume or decide to hold them, it is a problem that needs to be her. Same for CAD, COPD, HLD and so on)  Sepsis -Evidenced by fever, leukocytosis, and end organ damage of AKI on CKD, agree with coverage of CAP for now. -Given the high white count and increasing of left-sided pleural effusion, also need to rule out empyema, ordered IR guided thoracentesis set for tomorrow, n.p.o. after midnight -Sputum culture, urine strep antigen  Impending respiratory failure secondary to #1 -Also has signs of acute bronchitis/bronchospasm, will short course of p.o. steroid, every 6 and  as needed DuoNeb -Patient is DNR  AKI on CKD stage IIIb -Clinically, patient is hypovolemic, will start with IV fluid for 12 hours and recheck BMP in the morning  Recurrent UTI -Switch Macrobid to ceftriaxone to cover both UTI and pneumonia -Is her fourth time UTI of this year, recommend suppression treatment in the future  HTN -Restart metoprolol, hydralazine, hold long-acting amlodipine for now  Uncontrolled diabetes, IIDM -Most recent A1c 6.1 in September likely there is a hemoconcentration from dehydration/hypovolemic -Change p.o. DM meds to sliding scale for now  Anxiety depression -Restart as needed Xanax and SSRI   DVT prophylaxis: Heparin subcu Code Status: DNR Family Communication: Sister-in-law at bedside Disposition Plan: Elderly patient came with sepsis suspecting pneumonia plus minus empyema, expect 3 to 5 days hospital stay Consults called: None Admission status: PCU   Lequita Halt MD [facility] Pager [phone]  06/07/2020, 2:13 PM

## 2020-06-07 NOTE — Procedures (Signed)
PROCEDURE SUMMARY:  Successful image-guided left thoracentesis. Yielded 450 milliliters of hazy amber fluid. Patient tolerated procedure well. No immediate complications. EBL = 0 mL.  Specimen was sent for labs. CXR ordered.  Please see imaging section of Epic for full dictation.   Earley Abide PA-C 06/07/2020 3:47 PM

## 2020-06-07 NOTE — ED Provider Notes (Signed)
[facility]ERGENCY DEPARTMENT Provider Note   CSN: [id number] Arrival date & time: 06/07/20  8850     History Chief Complaint  Patient presents with  . Shortness of Breath    Renee [name] is a 86 y.o. female with history of HTN, DM glipizide, small left pleural effusion, CKD,urinary incontinence, frequent UTI presents to ED by EMS from home for evaluation of generalized malaise associated with productive cough for 1 week. Also with shortness of breath, mucus in her throat. Has had middle thoracic back pain for over one week.  Chills. No chest pain. No fever, nausea, vomiting, abdominal pain, diarrhea. No dysuria.  En route patient was hypoxic by EMS in the high 80s.  Patient has been taking tussex and claritin and albuterol inhaler but no better.  Usually does not use oxygen at home.  Sister in law at bedside states patient seems more tired.  She is typically fully oriented and still has a very sharp memory.  Patient lives alone with caregiver 6/7 days a week. Patient pressed her alarm button this morning.  Usually patient sits on her chair and recliner for the majority of the day.  Fully vaccinated for COVID and booster 10 days ago.  States she had a sore throat a couple of days after vaccine.    Chart reviewed.  Seen in UC 9/17 for dysuria and AMS and diagnosed and treated for UTI.  She was prescribed keflex then switched to Macrobid due to resistance. CXR at that time showed small left pleural effusion.  Followed up with her PCP 9/24 and had repeat UA and culture that grew >100,000 K E.coli and repeat macrobid prescription sent on 10/18 for this.  CT chest 10/5 showed left pleural effusion with nodules. Has pulmonology appointment December 21.   HPI     Past Medical History:  Diagnosis Date  . Angioedema    possibly from voltaren  . Bronchopneumonia 12/11/2016  . Degenerative disc disease   . Diabetes mellitus    type II  . Hyperlipidemia   . Hypertension    . LVH (left ventricular hypertrophy)    and atrial enlargement by echo in past with nl EF  . Nasal pruritis   . Osteoarthritis   . Osteopenia   . Renal insufficiency   . Sleep apnea   . Stroke [facility]) 05/2010   Small vessel sobcortical (in [place] trial) with Dr Leonie Man, residual L hemiparesis  . Vitamin B 12 deficiency 04/08    Patient Active Problem List   Diagnosis Date Noted  . Pleural effusion 05/13/2020  . Frequent UTI 05/13/2020  . Dizziness 02/12/2020  . Normocytic anemia 02/12/2020  . Fall involving sidewalk curb 11/29/2019  . Contusion of back 11/27/2019  . Hyperlipidemia associated with type 2 diabetes mellitus ([place])   . Aortic atherosclerosis ([place]) 07/13/2019  . H/O sepsis 07/01/2019  . CKD (chronic kidney disease) stage 4, GFR 15-29 ml/min (HCC) 07/01/2019  . Lower abdominal pain 05/11/2019  . Routine general medical examination at a health care facility 03/30/2019  . External hemorrhoid 01/17/2018  . History of colitis 01/17/2018  . Generalized weakness 12/24/2016  . Diabetic retinopathy ([place]) 12/11/2016  . History of CVA (cerebrovascular accident) 12/11/2016  . Estrogen deficiency 08/30/2015  . Encounter for Medicare annual wellness exam 04/17/2013  . Chest wall pain 04/07/2013  . Mobility impaired 06/26/2011  . History of retinal detachment 01/10/2011  . Sleep apnea 11/28/2010  . Anxiety and depression 08/25/2010  . Hemiplegia, late  effect of cerebrovascular disease ([place]) 07/05/2010  . POSTHERPETIC NEURALGIA 11/09/2009  . Renal insufficiency 06/29/2008  . Chronic back pain 01/26/2008  . EDEMA 01/26/2008  . B12 deficiency 01/10/2007  . Type 2 diabetes, controlled, with retinopathy ([place]) 11/27/2006  . Essential hypertension 11/27/2006  . FIBROCYSTIC BREAST DISEASE 11/27/2006  . ROSACEA 11/27/2006  . OSTEOARTHRITIS 11/27/2006  . URINARY INCONTINENCE, MIXED 11/27/2006    Past Surgical History:  Procedure Laterality Date  . ABDOMINAL HYSTERECTOMY      BSO-fibroids  . APPENDECTOMY    . BACK SURGERY    . COLON SURGERY     due to punctured intestines  . EYE SURGERY     cataract extraction  . KNEE SURGERY     arthroscope  . PARS PLANA VITRECTOMY  07/31/2011   Procedure: PARS PLANA VITRECTOMY WITH 25 GAUGE;  Surgeon: [name], MD;  Location: [facility];  Service: Ophthalmology;  Laterality: Right;  REMOVAL OF SILICONE OIL AND LASER RIGHT EYE  . RETINAL DETACHMENT SURGERY  02/18/11   times 2  . SPINE SURGERY  08/09   spinal decompression surgery     OB History   No obstetric history on file.     Family History  Problem Relation Age of Onset  . COPD Brother   . Cancer Sister        brain tumor with hemmorhage  . Heart disease Sister        CAD    Social History   Tobacco Use  . Smoking status: Never Smoker  . Smokeless tobacco: Never Used  Vaping Use  . Vaping Use: Never assessed  Substance Use Topics  . Alcohol use: No    Alcohol/week: 0.0 standard drinks  . Drug use: No    Home Medications Prior to Admission medications   Medication Sig Start Date End Date Taking? Authorizing Provider  acetaminophen (TYLENOL) 325 MG tablet Take 650 mg by mouth every 6 (six) hours as needed.    [provider]  albuterol (VENTOLIN HFA) 108 (90 Base) MCG/ACT inhaler Inhale 2 puffs into the lungs every 4 (four) hours as needed for wheezing or shortness of breath. 07/13/19   Tower, Wynelle Fanny, MD  ALPRAZolam [name]) 0.5 MG tablet TAKE 1/2 TO 1 TABLET BY MOUTH TWICE A DAY AS NEEDED FOR ANXIETY 05/16/20   Tower, Wynelle Fanny, MD  amLODipine (NORVASC) 10 MG tablet Take 1 tablet (10 mg total) by mouth daily. 09/30/18   Tower, Wynelle Fanny, MD  aspirin 325 MG tablet Take 325 mg by mouth daily.     [provider]  Blood Glucose Monitoring Suppl (ONE TOUCH ULTRA 2) w/Device KIT Check blood sugar once daily and as directed. Dx E11.9 08/27/18   Tower, Wynelle Fanny, MD  calcium-vitamin D (OSCAL WITH D) 500-200 MG-UNIT per tablet Take 1 tablet by  mouth daily.     [provider]  Cholecalciferol (VITAMIN D) 1000 UNITS capsule Take 1,000 Units by mouth daily.     [provider]  docusate sodium (COLACE) 100 MG capsule Take 200 mg by mouth at bedtime.    [provider]  ferrous sulfate 325 (65 FE) MG tablet Take 325 mg by mouth 2 (two) times daily.    [provider]  gemfibrozil (LOPID) 600 MG tablet TAKE 1 TABLET BY MOUTH EVERY DAY Patient taking differently: Take 600 mg by mouth daily.  09/08/19   Tower, Marne A, MD  glipiZIDE (GLUCOTROL XL) 2.5 MG 24 hr tablet TAKE 1  TABLET BY MOUTH EVERY DAY WITH BREAKFAST Patient taking differently: Take 2.5 mg by mouth daily with breakfast.  07/20/19   Tower, Wynelle Fanny, MD  hydrALAZINE (APRESOLINE) 100 MG tablet Take 100 mg by mouth 3 (three) times daily.  08/30/14   [provider]  metoprolol succinate (TOPROL-XL) 25 MG 24 hr tablet Take 0.5 tablets (12.5 mg total) by mouth 2 (two) times daily. 09/30/18   Tower, Wynelle Fanny, MD  mirtazapine (REMERON) 15 MG tablet TAKE 1/2 TABLET BY MOUTH AT BEDTIME Patient taking differently: Take 7.5 mg by mouth at bedtime.  06/16/19   Tower, Wynelle Fanny, MD  Multiple Vitamin (MULTIVITAMIN) capsule Take 1 capsule by mouth daily.     [provider]  nitrofurantoin, macrocrystal-monohydrate, (MACROBID) 100 MG capsule Take 1 capsule (100 mg total) by mouth 2 (two) times daily. 06/06/20   Tower, Wynelle Fanny, MD  Olopatadine HCl (PATADAY OP) Place 1 drop into both eyes daily.    [provider]  OneTouch Delica Lancets 85I MISC CHECK BLOOD SUGAR ONCE DAILY AND AS DIRECTED. DX E11.9 Patient taking differently: 1 Device by Other route daily.  10/23/19   Tower, Wynelle Fanny, MD  ONETOUCH ULTRA test strip CHECK BLOOD SUGAR ONCE DAILY AND AS DIRECTED. DX E11.9 04/05/20   Tower, Wynelle Fanny, MD  sertraline (ZOLOFT) 50 MG tablet TAKE 1 TABLET BY MOUTH EVERY DAY Patient taking differently: Take 50 mg by mouth daily.  09/08/19   Tower, Wynelle Fanny, MD  trandolapril (MAVIK) 4 MG tablet Take 1 tablet (4 mg total) by mouth daily. 09/30/18   Tower, Wynelle Fanny, MD  triamcinolone (NASACORT) 55 MCG/ACT AERO nasal inhaler Place 2 sprays into the nose daily. 03/28/20   [name], MD  vitamin B-12 (CYANOCOBALAMIN) 1000 MCG tablet Take 1,000 mcg by mouth daily.     [provider]    Allergies    Diclofenac sodium and Pioglitazone  Review of Systems   Review of Systems  Constitutional: Positive for fatigue and fever.  Respiratory: Positive for cough and shortness of breath.   Musculoskeletal: Positive for back pain.  All other systems reviewed and are negative.   Physical Exam Updated Vital Signs BP (!) 156/70   Pulse 95   Temp (!) 101.1 F (38.4 C) (Rectal)   Resp (!) 29   SpO2 99%   Physical Exam Vitals and nursing note reviewed.  Constitutional:      Appearance: She is well-developed. She is ill-appearing.     Comments: Appears tired but in no acute distress.  HENT:     Head: Normocephalic and atraumatic.     Right Ear: External ear normal.     Left Ear: External ear normal.     Nose: Nose normal.  Eyes:     General: No scleral icterus.    Conjunctiva/sclera: Conjunctivae normal.  Cardiovascular:     Rate and Rhythm: Regular rhythm. Tachycardia present.     Comments: No pretibial edema.  No calf tenderness.  1+ DP and radial pulses bilaterally.  Heart rate in the low 100s, palpable and feels regular. Pulmonary:     Breath sounds: Decreased breath sounds and rhonchi present.     Comments: Gurgling noise in the back of her throat noted.  SPO2 is 89-90% on room air during exam, placed on 2 L  and improvement in SPO2 greater than 92%.  Diffuse inspiratory and expiratory rhonchi in all lung fields, decreased breath sounds in the left lower lobe.  Wet cough  during exam. Musculoskeletal:        General: No deformity. Normal range of motion.     Cervical back: Normal range of motion and neck supple.  Skin:     General: Skin is warm and dry.     Capillary Refill: Capillary refill takes less than 2 seconds.  Neurological:     Mental Status: She is alert and oriented to person, place, and time.     Comments: Sensation to light touch/rub in face, upper and lower extremities intact.  Patient can lift all her extremities off the bed.  Subtle left hand grip noted, patient and sister-in-law both states she has left-sided weakness from previous stroke.  Psychiatric:        Behavior: Behavior normal.        Thought Content: Thought content normal.        Judgment: Judgment normal.     ED Results / Procedures / Treatments   Labs (all labs ordered are listed, but only abnormal results are displayed) Labs Reviewed  CBC WITH DIFFERENTIAL/PLATELET - Abnormal; Notable for the following components:      Result Value   WBC 19.1 (*)    Neutro Abs 18.3 (*)    Lymphs Abs 0.3 (*)    Abs Immature Granulocytes 0.10 (*)    All other components within normal limits  COMPREHENSIVE METABOLIC PANEL - Abnormal; Notable for the following components:   Glucose, Bld 282 (*)    Creatinine, Ser 1.47 (*)    Total Protein 5.8 (*)    GFR, Estimated 31 (*)    All other components within normal limits  CULTURE, BLOOD (ROUTINE X 2)  CULTURE, BLOOD (ROUTINE X 2)  RESPIRATORY PANEL BY RT PCR (FLU A&B, COVID)  URINE CULTURE  LACTIC ACID, PLASMA  LACTIC ACID, PLASMA  URINALYSIS, ROUTINE W REFLEX MICROSCOPIC  BRAIN NATRIURETIC PEPTIDE  PROTIME-INR  APTT    EKG None  Radiology DG Chest 2 View  Result Date: 06/07/2020 CLINICAL DATA:  Shortness of breath. EXAM: CHEST - 2 VIEW COMPARISON:  05/13/2020 chest radiograph and prior. 05/24/2020 CT chest. FINDINGS: Left basilar consolidation and small to moderate left pleural effusion are more conspicuous than prior exam. Clear right lung. No pneumothorax. Partially obscured cardiomediastinal silhouette. Osseous structures are unchanged. IMPRESSION: Increased conspicuity of left  basilar consolidation. Increased small to moderate left pleural effusion. Electronically Signed   By: Primitivo Gauze M.D.   On: 06/07/2020 10:12    Procedures .Critical Care Performed by: Kinnie Feil, PA-C Authorized by: Kinnie Feil, PA-C   Critical care provider statement:    Critical care time (minutes):  45   Critical care was necessary to treat or prevent imminent or life-threatening deterioration of the following conditions:  Respiratory failure (sepsis)   Critical care was time spent personally by me on the following activities:  Discussions with consultants, evaluation of patient's response to treatment, examination of patient, ordering and performing treatments and interventions, ordering and review of laboratory studies, ordering and review of radiographic studies, pulse oximetry, re-evaluation of patient's condition, obtaining history from patient or surrogate, review of old charts and development of treatment plan with patient or surrogate   I assumed direction of critical care for this patient from another provider in my specialty: no     (including critical care time)  Medications Ordered in ED Medications  azithromycin (ZITHROMAX) 500 mg in sodium chloride 0.9 % 250 mL IVPB (500 mg Intravenous New Bag/Given 06/07/20 1229)  albuterol (PROVENTIL) (2.5  MG/3ML) 0.083% nebulizer solution 2.5 mg (has no administration in time range)  cefTRIAXone (ROCEPHIN) 1 g in sodium chloride 0.9 % 100 mL IVPB (0 g Intravenous Stopped 06/07/20 1231)  acetaminophen (TYLENOL) tablet 650 mg (650 mg Oral Given 06/07/20 1222)    ED Course  I have reviewed the triage vital signs and the nursing notes.  Pertinent labs & imaging results that were available during my care of the patient were reviewed by me and considered in my medical decision making (see chart for details).  Clinical Course as of Jun 07 1256  Tue Jun 07, 2020  1017 IMPRESSION: Increased conspicuity of left  basilar consolidation.  Increased small to moderate left pleural effusion.    DG Chest 2 View [CG]  1207 Pulse Rate(!): 105 [CG]  1208 Temp(!): 101.1 F (38.4 C) [CG]  1208 Resp(!): 28 [CG]  1254 EMR reviewed. Seen in the last couple of weeks by urgent care, ER, PCP.  Has had several UTIs in the last month, last urine culture grew 100 K of E. coli restarted on Macrobid yesterday.  Chest x-ray recently showed left pleural effusion and CT of the chest obtained by PCP revealing several lung nodules with small effusion and patient is awaiting pulmonology evaluation for this in December.   [CG]    Clinical Course User Index [CG] Arlean Hopping   MDM Rules/Calculators/A&P                          86 year old female brought to the ED from home for shortness of breath, cough for 1 week, malaise, chills.  EMR, triage nurse notes reviewed to assist with MDM and obtain more history.  See above.    Patient arrives febrile with rectal temperature 101.1, intermittently tachycardic in the low 100s, tachypneic.  Hypoxic in the high 80s by EMS now stable on 2 L [place].  Blood pressure stable and slightly hypertensive.  Appears tired but mentating well.  Obvious rhonchi on exam with decreased lung sounds in the left lower lobe.  Meets SIRS/sepsis criteria with fever, tachycardia, hypoxia.  DDx includes community-acquired pneumonia.  Fully vaccinated for COVID with booster and Covid is less likely but possible.  Has no urinary symptoms but with history of recurrent UTIs and urine source also possibility.  ER work-up initiated in triage by RN including chest x-ray and EKG.  Chest x-ray reveals slightly worsening left pleural effusion with left basilar consolidation, this fits clinical presentation.  EKG shows sinus tachycardia HR 115, nonspecific ST changes in V1 and V2, PACs.  I have ordered lab work including sepsis order set including lactic acid, blood cultures, catheterized urine, respiratory  panel.  ER work-up personally visualized and interpreted.  Data BC 19.1.  Lactic acid 1.2.  Creatinine 1.47 only slightly above her baseline.  Hyperglycemic with normal anion gap.  Work-up thus far is most consistent with sepsis and respiratory failure due to CAP.  No evidence of severe sepsis, septic shock.  I have ordered medications including Rocephin, azithromycin, Tylenol, albuterol nebulizing treatment.  Discussed with Dr. Roosevelt Locks who will admit patient.  Discussed with EDP.  3382: Patient reevaluated and no clinical decline.  Updated on plan of care and plan to admit.  Pending repeat lactic, COVID, cath UA.  Final Clinical Impression(s) / ED Diagnoses Final diagnoses:  Community acquired pneumonia of left lower lobe of lung  Sepsis without acute organ dysfunction, due to unspecified organism [facility])    Rx /  DC Orders ED Discharge Orders    None       Arlean Hopping 06/07/20 1257    Davonna Belling, MD 06/07/20 [phone]

## 2020-06-08 ENCOUNTER — Inpatient Hospital Stay (HOSPITAL_COMMUNITY): Payer: Medicare HMO

## 2020-06-08 DIAGNOSIS — Z9889 Other specified postprocedural states: Secondary | ICD-10-CM

## 2020-06-08 LAB — CBC WITH DIFFERENTIAL/PLATELET
Abs Immature Granulocytes: 0.04 10*3/uL (ref 0.00–0.07)
Basophils Absolute: 0 10*3/uL (ref 0.0–0.1)
Basophils Relative: 0 %
Eosinophils Absolute: 0.2 10*3/uL (ref 0.0–0.5)
Eosinophils Relative: 2 %
HCT: 33.6 % — ABNORMAL LOW (ref 36.0–46.0)
Hemoglobin: 10.5 g/dL — ABNORMAL LOW (ref 12.0–15.0)
Immature Granulocytes: 0 %
Lymphocytes Relative: 10 %
Lymphs Abs: 0.9 10*3/uL (ref 0.7–4.0)
MCH: 28.3 pg (ref 26.0–34.0)
MCHC: 31.3 g/dL (ref 30.0–36.0)
MCV: 90.6 fL (ref 80.0–100.0)
Monocytes Absolute: 0.7 10*3/uL (ref 0.1–1.0)
Monocytes Relative: 8 %
Neutro Abs: 7.1 10*3/uL (ref 1.7–7.7)
Neutrophils Relative %: 80 %
Platelets: 159 10*3/uL (ref 150–400)
RBC: 3.71 MIL/uL — ABNORMAL LOW (ref 3.87–5.11)
RDW: 13.9 % (ref 11.5–15.5)
WBC: 9.1 10*3/uL (ref 4.0–10.5)
nRBC: 0 % (ref 0.0–0.2)

## 2020-06-08 LAB — PH, BODY FLUID: pH, Body Fluid: 7.5

## 2020-06-08 LAB — BASIC METABOLIC PANEL
Anion gap: 9 (ref 5–15)
BUN: 26 mg/dL — ABNORMAL HIGH (ref 8–23)
CO2: 23 mmol/L (ref 22–32)
Calcium: 8.5 mg/dL — ABNORMAL LOW (ref 8.9–10.3)
Chloride: 108 mmol/L (ref 98–111)
Creatinine, Ser: 1.5 mg/dL — ABNORMAL HIGH (ref 0.44–1.00)
GFR, Estimated: 31 mL/min — ABNORMAL LOW (ref >60)
Glucose, Bld: 129 mg/dL — ABNORMAL HIGH (ref 70–99)
Potassium: 4 mmol/L (ref 3.5–5.1)
Sodium: 140 mmol/L (ref 135–145)

## 2020-06-08 LAB — EXPECTORATED SPUTUM ASSESSMENT W GRAM STAIN, RFLX TO RESP C

## 2020-06-08 LAB — GLUCOSE, CAPILLARY
Glucose-Capillary: 139 mg/dL — ABNORMAL HIGH (ref 70–99)
Glucose-Capillary: 242 mg/dL — ABNORMAL HIGH (ref 70–99)
Glucose-Capillary: 206 mg/dL — ABNORMAL HIGH (ref 70–99)
Glucose-Capillary: 212 mg/dL — ABNORMAL HIGH (ref 70–99)

## 2020-06-08 MED ORDER — IPRATROPIUM-ALBUTEROL 0.5-2.5 (3) MG/3ML IN SOLN
3.0000 mL | Freq: Two times a day (BID) | RESPIRATORY_TRACT | Status: DC
  Administered 2020-06-08 – 2020-06-11 (×6): 3 mL via RESPIRATORY_TRACT
  Filled 2020-06-08 (×6): qty 3

## 2020-06-08 MED ORDER — GUAIFENESIN-DM 100-10 MG/5ML PO SYRP
5.0000 mL | ORAL_SOLUTION | ORAL | Status: DC | PRN
  Administered 2020-06-08: 5 mL via ORAL
  Filled 2020-06-08: qty 5

## 2020-06-08 MED ORDER — PREDNISONE 20 MG PO TABS
30.0000 mg | ORAL_TABLET | Freq: Every day | ORAL | Status: DC
  Administered 2020-06-09 – 2020-06-11 (×3): 30 mg via ORAL
  Filled 2020-06-08 (×3): qty 1

## 2020-06-08 NOTE — Progress Notes (Addendum)
PROGRESS NOTE        PATIENT DETAILS Name: Renee [name] Age: 86 y.o. Sex: female Date of Birth: [birth year] Admit Date: 06/07/2020 Admitting Physician Lequita Halt, MD BPZ:WCHEN, Wynelle Fanny, MD  Brief Narrative: Patient is a 86 y.o. female with history of HTN, DM, stage IIIb CKD, HLD, depression/anxiety-frequent UTIs (on antibiotics prior to this hospital stay)-presented with fever, shortness of breath cough, fever chills-thought to have pneumonia with probable associated parapneumonic effusion and subsequently admitted to the hospitalist service.  See below for further details.  Significant events: 10/19>> admit to [facility] for pneumonia/parapneumonic effusion  Significant studies: 10/19>> chest x-ray: Increased conspicuity of left basilar consolidation, increased small to moderate left pleural effusion.  Antimicrobial therapy: Rocephin: 10/19>> Zithromax: 10/19>>  Microbiology data: 10/20>> sputum culture: Pending 10/19>> pleural fluid culture: Pending 10/19>> blood culture: Pending  Procedures : 10/19>> left thoracocentesis by IR  Consults: None  DVT Prophylaxis : heparin injection 5,000 Units Start: 06/07/20 1400   Subjective: Lying comfortably in bed-no major events overnight-says breathing is somewhat better.  Assessment/Plan: Sepsis secondary to presumed community-acquired pneumonia: Sepsis physiology has resolved-await culture data-continue Rocephin/Zithromax.  No major bronchospasm heard today-start prednisone taper.   CKD stage IIIb: creatinine not very far from baseline-supportive care for now.  Recurrent UTI: Apparently was on Keflex then subsequently switched to Macrobid-recent urine culture positive for pansensitive E. coli.  Currently on Rocephin that should cover any lingering UTI.  Left-sided pleural effusion: Underwent paracentesis on 10/19 by IR-await pleural fluid culture results but apart from leukocytosis-no indication that  this is an empyema at this point.  Patient apparently was supposed to see pulmonary in the outpatient-for further eval of left-sided pleural effusion-we will touch base.  Check echo.  History of right lung nodules: Stable for continued outpatient surveillance by PCP.  Chronic pericardial effusion: Mentioned on recent CT chest done on 10/5-awaiting echo.  HTN: BP reasonable-continue metoprolol and ACE inhibitor  History of CVA-continue aspirin.  HLD: Continue Lopid.  DM-2: CBGs relatively stable with SSI-hold hypoglycemic agents on hold  Recent Labs    06/07/20 2004 06/07/20 2129 06/08/20 0720  GLUCAP 268* 220* 139*   Anxiety/depression: Stable-continue Zoloft, Remeron-and as needed Xanax.  Debility/deconditioning: Await PT/OT eval  Obesity: Estimated body mass index is 31.09 kg/m as calculated from the following:   Height as of this encounter: 5\' 2"  (1.575 m).   Weight as of this encounter: 77.1 kg.    Diet: Diet Order            Diet heart healthy/carb modified Room service appropriate? Yes; Fluid consistency: Thin  Diet effective now                  Code Status: DNR  Family Communication: Sister ([phone] over the phone on 10/20  Disposition Plan: Status is: Inpatient  Remains inpatient appropriate because:Inpatient level of care appropriate due to severity of illness  Dispo: The patient is from: Home              Anticipated d/c is to: Home              Anticipated d/c date is: > 3 days              Patient currently is not medically stable to d/c.    Barriers to Discharge: Pneumonia-possible parapneumonic effusion requiring IV antibiotics  Antimicrobial agents: Anti-infectives (From admission, onward)   Start     Dose/Rate Route Frequency Ordered Stop   06/08/20 1230  azithromycin (ZITHROMAX) 500 mg in sodium chloride 0.9 % 250 mL IVPB        500 mg 250 mL/hr over 60 Minutes Intravenous Every 24 hours 06/07/20 1352     06/08/20 1200   cefTRIAXone (ROCEPHIN) 2 g in sodium chloride 0.9 % 100 mL IVPB        2 g 200 mL/hr over 30 Minutes Intravenous Every 24 hours 06/07/20 1352     06/07/20 1400  cefTRIAXone (ROCEPHIN) 2 g in sodium chloride 0.9 % 100 mL IVPB  Status:  Discontinued        2 g 200 mL/hr over 30 Minutes Intravenous Every 24 hours 06/07/20 1351 06/07/20 1352   06/07/20 1400  azithromycin (ZITHROMAX) 500 mg in sodium chloride 0.9 % 250 mL IVPB  Status:  Discontinued        500 mg 250 mL/hr over 60 Minutes Intravenous Every 24 hours 06/07/20 1351 06/07/20 1352   06/07/20 1045  cefTRIAXone (ROCEPHIN) 1 g in sodium chloride 0.9 % 100 mL IVPB        1 g 200 mL/hr over 30 Minutes Intravenous  Once 06/07/20 1040 06/07/20 1231   06/07/20 1045  azithromycin (ZITHROMAX) 500 mg in sodium chloride 0.9 % 250 mL IVPB        500 mg 250 mL/hr over 60 Minutes Intravenous  Once 06/07/20 1040 06/07/20 1329       Time spent: 35 minutes-Greater than 50% of this time was spent in counseling, explanation of diagnosis, planning of further management, and coordination of care.  MEDICATIONS: Scheduled Meds: . albuterol  2.5 mg Nebulization Once  . aspirin  325 mg Oral Daily  . calcium-vitamin D  1 tablet Oral Daily  . cholecalciferol  1,000 Units Oral Daily  . docusate sodium  200 mg Oral QHS  . ferrous sulfate  325 mg Oral BID  . gemfibrozil  600 mg Oral Daily  . heparin  5,000 Units Subcutaneous Q12H  . hydrALAZINE  100 mg Oral TID  . insulin aspart  0-9 Units Subcutaneous TID WC  . ipratropium-albuterol  3 mL Nebulization BID  . metoprolol tartrate  12.5 mg Oral BID  . mirtazapine  7.5 mg Oral QHS  . olopatadine  1 drop Both Eyes Daily  . predniSONE  40 mg Oral Q breakfast  . sertraline  50 mg Oral Daily  . trandolapril  4 mg Oral Daily  . triamcinolone  2 spray Nasal Daily  . vitamin B-12  1,000 mcg Oral Daily   Continuous Infusions: . azithromycin    . cefTRIAXone (ROCEPHIN)  IV     PRN Meds:.acetaminophen  **OR** acetaminophen, ALPRAZolam, guaiFENesin-dextromethorphan, ipratropium-albuterol, ondansetron **OR** ondansetron (ZOFRAN) IV   PHYSICAL EXAM: Vital signs: Vitals:   06/07/20 2219 06/07/20 2325 06/08/20 0411 06/08/20 0722  BP:  (!) 117/55 (!) 111/51 123/65  Pulse:  66 70 62  Resp:  (!) 22 20 (!) 22  Temp:  99.5 F (37.5 C) (!) 97.1 F (36.2 C) 97.7 F (36.5 C)  TempSrc:  Axillary Axillary Oral  SpO2: 99% 93%    Weight:  77.1 kg    Height:  5\' 2"  (1.575 m)     Filed Weights   06/07/20 2325  Weight: 77.1 kg   Body mass index is 31.09 kg/m.   Gen Exam:Alert awake-not in any distress HEENT:atraumatic, normocephalic Chest: B/L clear  to auscultation anteriorly CVS:S1S2 regular Abdomen:soft non tender, non distended Extremities:no edema Neurology: Non focal Skin: no rash  I have personally reviewed following labs and imaging studies  LABORATORY DATA: CBC: Recent Labs  Lab 06/07/20 1145 06/08/20 0333  WBC 19.1* 9.1  NEUTROABS 18.3* 7.1  HGB 12.6 10.5*  HCT 42.0 33.6*  MCV 91.1 90.6  PLT 179 706    Basic Metabolic Panel: Recent Labs  Lab 06/07/20 1145 06/08/20 0333  NA 139 140  K 4.1 4.0  CL 104 108  CO2 23 23  GLUCOSE 282* 129*  BUN 21 26*  CREATININE 1.47* 1.50*  CALCIUM 9.1 8.5*    GFR: Estimated Creatinine Clearance: 24.4 mL/min (A) (by C-G formula based on SCr of 1.5 mg/dL (H)).  Liver Function Tests: Recent Labs  Lab 06/07/20 1145  AST 19  ALT 18  ALKPHOS 76  BILITOT 0.9  PROT 5.8*  ALBUMIN 3.5   No results for input(s): LIPASE, AMYLASE in the last 168 hours. No results for input(s): AMMONIA in the last 168 hours.  Coagulation Profile: Recent Labs  Lab 06/07/20 1735  INR 1.0    Cardiac Enzymes: No results for input(s): CKTOTAL, CKMB, CKMBINDEX, TROPONINI in the last 168 hours.  BNP (last 3 results) No results for input(s): PROBNP in the last 8760 hours.  Lipid Profile: No results for input(s): CHOL, HDL, LDLCALC, TRIG,  CHOLHDL, LDLDIRECT in the last 72 hours.  Thyroid Function Tests: No results for input(s): TSH, T4TOTAL, FREET4, T3FREE, THYROIDAB in the last 72 hours.  Anemia Panel: No results for input(s): VITAMINB12, FOLATE, FERRITIN, TIBC, IRON, RETICCTPCT in the last 72 hours.  Urine analysis:    Component Value Date/Time   COLORURINE YELLOW 02/12/2020 1423   APPEARANCEUR CLEAR 02/12/2020 1423   LABSPEC 1.020 05/06/2020 1001   PHURINE 5.5 05/06/2020 1001   GLUCOSEU NEGATIVE 05/06/2020 1001   HGBUR TRACE (A) 05/06/2020 1001   HGBUR negative 03/27/2010 1358   BILIRUBINUR Negative 05/30/2020 1153   KETONESUR NEGATIVE 05/06/2020 1001   PROTEINUR Positive (A) 05/30/2020 1153   PROTEINUR 30 (A) 05/06/2020 1001   UROBILINOGEN 0.2 05/30/2020 1153   UROBILINOGEN 0.2 05/06/2020 1001   NITRITE Positive 05/30/2020 1153   NITRITE NEGATIVE 05/06/2020 1001   LEUKOCYTESUR Moderate (2+) (A) 05/30/2020 1153   LEUKOCYTESUR LARGE (A) 05/06/2020 1001    Sepsis Labs: Lactic Acid, Venous    Component Value Date/Time   LATICACIDVEN 1.2 06/07/2020 1145    MICROBIOLOGY: Recent Results (from the past 240 hour(s))  Urine Culture     Status: Abnormal   Collection Time: 05/30/20 11:53 AM   Specimen: Urine  Result Value Ref Range Status   MICRO NUMBER: [id number]  Final   SPECIMEN QUALITY: Adequate  Final   Sample Source NOT GIVEN  Final   STATUS: FINAL  Final   ISOLATE 1: Escherichia coli (A)  Final    Comment: Greater than 100,000 CFU/mL of Escherichia coli      Susceptibility   Escherichia coli - URINE CULTURE, REFLEX    AMOX/CLAVULANIC <=2 Sensitive     AMPICILLIN 4 Sensitive     AMPICILLIN/SULBACTAM <=2 Sensitive     CEFAZOLIN* <=4 Not Reportable      * For infections other than uncomplicated UTIcaused by E. coli, K. pneumoniae or P. mirabilis:Cefazolin is resistant if MIC > or = 8 mcg/mL.(Distinguishing susceptible versus intermediatefor isolates with MIC < or = 4 mcg/mL requiresadditional  testing.)For uncomplicated UTI caused by E. coli,K. pneumoniae or P. mirabilis: Cefazolin issusceptible  if MIC <32 mcg/mL and predictssusceptible to the oral agents cefaclor, cefdinir,cefpodoxime, cefprozil, cefuroxime, cephalexinand loracarbef.    CEFEPIME <=1 Sensitive     CEFTRIAXONE <=1 Sensitive     CIPROFLOXACIN <=0.25 Sensitive     LEVOFLOXACIN <=0.12 Sensitive     ERTAPENEM <=0.5 Sensitive     GENTAMICIN <=1 Sensitive     IMIPENEM <=0.25 Sensitive     NITROFURANTOIN <=16 Sensitive     PIP/TAZO <=4 Sensitive     TOBRAMYCIN <=1 Sensitive     TRIMETH/SULFA* <=20 Sensitive      * For infections other than uncomplicated UTIcaused by E. coli, K. pneumoniae or P. mirabilis:Cefazolin is resistant if MIC > or = 8 mcg/mL.(Distinguishing susceptible versus intermediatefor isolates with MIC < or = 4 mcg/mL requiresadditional testing.)For uncomplicated UTI caused by E. coli,K. pneumoniae or P. mirabilis: Cefazolin issusceptible if MIC <32 mcg/mL and predictssusceptible to the oral agents cefaclor, cefdinir,cefpodoxime, cefprozil, cefuroxime, cephalexinand loracarbef.Legend:S = Susceptible  I = IntermediateR = Resistant  NS = Not susceptible* = Not tested  NR = Not reported**NN = See antimicrobic comments  Respiratory Panel by RT PCR (Flu A&B, Covid) - Nasopharyngeal Swab     Status: None   Collection Time: 06/07/20 12:09 PM   Specimen: Nasopharyngeal Swab  Result Value Ref Range Status   SARS Coronavirus 2 by RT PCR NEGATIVE NEGATIVE Final    Comment: (NOTE) SARS-CoV-2 target nucleic acids are NOT DETECTED.  The SARS-CoV-2 RNA is generally detectable in upper respiratoy specimens during the acute phase of infection. The lowest concentration of SARS-CoV-2 viral copies this assay can detect is 131 copies/mL. A negative result does not preclude SARS-Cov-2 infection and should not be used as the sole basis for treatment or other patient management decisions. A negative result may occur with    improper specimen collection/handling, submission of specimen other than nasopharyngeal swab, presence of viral mutation(s) within the areas targeted by this assay, and inadequate number of viral copies (<131 copies/mL). A negative result must be combined with clinical observations, patient history, and epidemiological information. The expected result is Negative.  Fact Sheet for Patients:  PinkCheek.be  Fact Sheet for Healthcare Providers:  GravelBags.it  This test is no t yet approved or cleared by the [place] FDA and  has been authorized for detection and/or diagnosis of SARS-CoV-2 by FDA under an Emergency Use Authorization (EUA). This EUA will remain  in effect (meaning this test can be used) for the duration of the COVID-19 declaration under Section 564(b)(1) of the Act, 21 U.S.C. section 360bbb-3(b)(1), unless the authorization is terminated or revoked sooner.     Influenza A by PCR NEGATIVE NEGATIVE Final   Influenza B by PCR NEGATIVE NEGATIVE Final    Comment: (NOTE) The Xpert Xpress SARS-CoV-2/FLU/RSV assay is intended as an aid in  the diagnosis of influenza from Nasopharyngeal swab specimens and  should not be used as a sole basis for treatment. Nasal washings and  aspirates are unacceptable for Xpert Xpress SARS-CoV-2/FLU/RSV  testing.  Fact Sheet for Patients: PinkCheek.be  Fact Sheet for Healthcare Providers: GravelBags.it  This test is not yet approved or cleared by the [place] FDA and  has been authorized for detection and/or diagnosis of SARS-CoV-2 by  FDA under an Emergency Use Authorization (EUA). This EUA will remain  in effect (meaning this test can be used) for the duration of the  Covid-19 declaration under Section 564(b)(1) of the Act, 21  U.S.C. section 360bbb-3(b)(1), unless the authorization is  terminated  or  revoked. Performed at [facility], [place] [address], [place], [place] [address]   Gram stain     Status: None   Collection Time: 06/07/20  3:56 PM   Specimen: Lung, Left; Pleural Fluid  Result Value Ref Range Status   Specimen Description FLUID LEFT PLEURAL  Final   Special Requests NONE  Final   Gram Stain   Final    MODERATE WBC PRESENT,BOTH PMN AND MONONUCLEAR NO ORGANISMS SEEN Performed at [facility], [place] [address] [place], [place] [address]    Report Status 06/07/2020 FINAL  Final  MRSA PCR Screening     Status: None   Collection Time: 06/07/20  9:34 PM   Specimen: Nasopharyngeal  Result Value Ref Range Status   MRSA by PCR NEGATIVE NEGATIVE Final    Comment:        The GeneXpert MRSA Assay (FDA approved for NASAL specimens only), is one component of a comprehensive MRSA colonization surveillance program. It is not intended to diagnose MRSA infection nor to guide or monitor treatment for MRSA infections. Performed at [facility], [place] [address] [place], [place] [address]     RADIOLOGY STUDIES/RESULTS: DG Chest 1 View  Result Date: 06/07/2020 CLINICAL DATA:  Status post left thoracentesis EXAM: CHEST  1 VIEW COMPARISON:  Same day chest radiograph FINDINGS: Stable cardiomegaly. Interval decrease of left-sided pleural effusion. Persistent opacity within the left lung base may reflect combination of atelectasis, consolidation, and trace residual fluid. No pneumothorax identified. Right lung is clear. IMPRESSION: Interval decrease of left-sided pleural effusion. No pneumothorax. Electronically Signed   By: Davina Poke D.O.   On: 06/07/2020 16:14   DG Chest 2 View  Result Date: 06/07/2020 CLINICAL DATA:  Shortness of breath. EXAM: CHEST - 2 VIEW COMPARISON:  05/13/2020 chest radiograph and prior. 05/24/2020 CT chest. FINDINGS: Left basilar consolidation and small to moderate left pleural effusion are more conspicuous than prior exam. Clear right  lung. No pneumothorax. Partially obscured cardiomediastinal silhouette. Osseous structures are unchanged. IMPRESSION: Increased conspicuity of left basilar consolidation. Increased small to moderate left pleural effusion. Electronically Signed   By: Primitivo Gauze M.D.   On: 06/07/2020 10:12   IR THORACENTESIS ASP PLEURAL SPACE W/IMG GUIDE  Result Date: 06/07/2020 INDICATION: Patient with history of CKD admitted for sepsis, dyspnea, and found to have left pleural effusion. Request is made for diagnostic and therapeutic left thoracentesis. EXAM: ULTRASOUND GUIDED DIAGNOSTIC AND THERAPEUTIC LEFT THORACENTESIS MEDICATIONS: 10 mL 1% lidocaine COMPLICATIONS: None immediate. PROCEDURE: An ultrasound guided thoracentesis was thoroughly discussed with the patient and questions answered. The benefits, risks, alternatives and complications were also discussed. The patient understands and wishes to proceed with the procedure. Written consent was obtained. Ultrasound was performed to localize and mark an adequate pocket of fluid in the left chest. The area was then prepped and draped in the normal sterile fashion. 1% Lidocaine was used for local anesthesia. Under ultrasound guidance a 6 Fr Safe-T-Centesis catheter was introduced. Thoracentesis was performed. The catheter was removed and a dressing applied. FINDINGS: A total of approximately 450 mL of hazy amber fluid was removed. Samples were sent to the laboratory as requested by the clinical team. IMPRESSION: Successful ultrasound guided left thoracentesis yielding 450 mL of pleural fluid. Read by: Earley Abide, PA-C Electronically Signed   By: Aletta Edouard M.D.   On: 06/07/2020 16:18     LOS: 1 day   Oren Binet, MD  [facility]  To contact the attending provider between 7A-7P or the covering provider during after hours 7P-7A, please log into the web site [email or web address] and access using universal [place] password for that web site. If  you do not have the password, please call the hospital operator.  06/08/2020, 10:05 AM

## 2020-06-08 NOTE — Plan of Care (Signed)
  Problem: Education: Goal: Knowledge of General Education information will improve Description: Including pain rating scale, medication(s)/side effects and non-pharmacologic comfort measures 06/08/2020 1450 by Thomasene Lot, RN Outcome: Progressing 06/08/2020 1448 by Thomasene Lot, RN Outcome: Progressing   Problem: Health Behavior/Discharge Planning: Goal: Ability to manage health-related needs will improve 06/08/2020 1450 by Thomasene Lot, RN Outcome: Progressing 06/08/2020 1448 by Thomasene Lot, RN Outcome: Progressing   Problem: Clinical Measurements: Goal: Ability to maintain clinical measurements within normal limits will improve 06/08/2020 1450 by Thomasene Lot, RN Outcome: Progressing 06/08/2020 1448 by Thomasene Lot, RN Outcome: Progressing   Problem: Clinical Measurements: Goal: Respiratory complications will improve 06/08/2020 1450 by Thomasene Lot, RN Outcome: Progressing 06/08/2020 1448 by Thomasene Lot, RN Outcome: Progressing   Problem: Activity: Goal: Risk for activity intolerance will decrease 06/08/2020 1450 by Thomasene Lot, RN Outcome: Progressing 06/08/2020 1448 by Thomasene Lot, RN Outcome: Progressing   Problem: Nutrition: Goal: Adequate nutrition will be maintained Outcome: Progressing   Problem: Coping: Goal: Level of anxiety will decrease 06/08/2020 1450 by Thomasene Lot, RN Outcome: Progressing 06/08/2020 1448 by Thomasene Lot, RN Outcome: Progressing   Problem: Safety: Goal: Ability to remain free from injury will improve Outcome: Progressing   Problem: Skin Integrity: Goal: Risk for impaired skin integrity will decrease Outcome: Progressing

## 2020-06-08 NOTE — Evaluation (Signed)
Physical Therapy Evaluation Patient Details Name: Renee [name] MRN: [id number] DOB: Oct 28, 1928 Today's Date: 06/08/2020   History of Present Illness  86 y.o. female with medical history significant of HTN, IIDM, CKD stage IIIb, HLD, depression/anxiety, frequent UTIs, presented to [facility] on 10/19 with increasing shortness of breath. Sepsis workup, s/p L thoracentesis yielding 450 mL amber fluid. Patient reported that her symptoms started about 1 week ago after she got her COVID booster shot.  Clinical Impression   Pt presents with LE weakness, chronic L weakness secondary to CVA, impaired sitting and standing balance, difficulty performing mobility tasks, and decreased activity tolerance. Pt to benefit from acute PT to address deficits. Pt required mod assist for bed mobility and multiple transfers during session. Pt additionally required pericare assist from NT during 3rd transfer, has an aide to assist with pericare at home as needed. At baseline, pt is ambulatory with RW, has an aide 6 days a week for multiple hours a day, and has home health services. PT recommending HHPT with increased supervision, vs ST-SNF if pt with insufficient home supervision. PT to progress mobility as tolerated, and will continue to follow acutely.   SpO25min 87% on 2LO2, DOE 2/4 with activity, coughing with secretions x2 during session   Follow Up Recommendations Home health PT;SNF    Equipment Recommendations  None recommended by PT    Recommendations for Other Services       Precautions / Restrictions Precautions Precautions: Fall Precaution Comments: sats Restrictions Weight Bearing Restrictions: No      Mobility  Bed Mobility Overal bed mobility: Needs Assistance Bed Mobility: Supine to Sit     Supine to sit: Mod assist;HOB elevated     General bed mobility comments: Mod assist for trunk elevation, scooting to EOB, LE management. Very increased time to perform.    Transfers Overall transfer  level: Needs assistance Equipment used: Rolling walker (2 wheeled);1 person hand held assist Transfers: Sit to/from Stand;Squat Pivot Transfers;Stand Pivot Transfers Sit to Stand: Mod assist Stand pivot transfers: Mod assist Squat pivot transfers: Mod assist;+2 safety/equipment     General transfer comment: Mod assist for power up, steadying, and hip extension to upright for stand pivot to recliner. Sit to stand x3, from EOB, recliner, and BSC. squat pivot x2, to and from [facility], with HHA and cues for hand placement (place hand on destination surface) and pivoting hips. SpO2 min 87% on 2LO2.  Ambulation/Gait             General Gait Details: NT  Stairs            Wheelchair Mobility    Modified Rankin (Stroke Patients Only)       Balance Overall balance assessment: Needs assistance Sitting-balance support: Feet supported Sitting balance-Leahy Scale: Fair Sitting balance - Comments: able to sit EOB without PT assist, cannot accept challenge   Standing balance support: Bilateral upper extremity supported Standing balance-Leahy Scale: Poor Standing balance comment: reliant on external assist                             Pertinent Vitals/Pain Pain Assessment: Faces Faces Pain Scale: No hurt Pain Intervention(s): Monitored during session    Home Living Family/patient expects to be discharged to:: Private residence Living Arrangements: Alone Available Help at Discharge: Family;Available PRN/intermittently;Personal care attendant Type of Home: House Home Access: Ramped entrance     Home Layout: One level Home Equipment: Walker - 2 wheels;Walker - 4  wheels;Transport chair Additional Comments: lives alone with [facility] aide x6 hours daily - with pt from 9-3    Prior Function Level of Independence: Needs assistance   Gait / Transfers Assistance Needed: uses RW for ambulation in home, transport chair for appointments and in/out house  ADL's / Homemaking  Assistance Needed: caregiver assist for meals, in/out of bed, transfers, toileting        Hand Dominance   Dominant Hand: Right    Extremity/Trunk Assessment   Upper Extremity Assessment Upper Extremity Assessment: Defer to OT evaluation (LUE chronic weakness secondary to CVA)    Lower Extremity Assessment Lower Extremity Assessment: Generalized weakness;LLE deficits/detail LLE Deficits / Details: chronic weakness secondary to CVA 10 years ago    Cervical / Trunk Assessment Cervical / Trunk Assessment: Normal  Communication   Communication: HOH  Cognition Arousal/Alertness: Awake/alert Behavior During Therapy: WFL for tasks assessed/performed Overall Cognitive Status: Within Functional Limits for tasks assessed                                 General Comments: Follows commands appropriately, reports she has enough assist at home      General Comments General comments (skin integrity, edema, etc.): SpO52min 87% on 2LO2, DOE 2/4 with activity, coughing with secretions x2 during session.    Exercises     Assessment/Plan    PT Assessment Patient needs continued PT services  PT Problem List Decreased strength;Decreased mobility;Decreased activity tolerance;Decreased balance;Decreased knowledge of use of DME;Cardiopulmonary status limiting activity;Decreased safety awareness       PT Treatment Interventions DME instruction;Therapeutic activities;Gait training;Therapeutic exercise;Patient/family education;Balance training;Neuromuscular re-education;Functional mobility training    PT Goals (Current goals can be found in the Care Plan section)  Acute Rehab PT Goals Patient Stated Goal: go home PT Goal Formulation: With patient Time For Goal Achievement: 06/22/20 Potential to Achieve Goals: Good    Frequency Min 3X/week   Barriers to discharge        Co-evaluation               AM-PAC PT "6 Clicks" Mobility  Outcome Measure Help needed turning  from your back to your side while in a flat bed without using bedrails?: A Little Help needed moving from lying on your back to sitting on the side of a flat bed without using bedrails?: A Lot Help needed moving to and from a bed to a chair (including a wheelchair)?: A Lot Help needed standing up from a chair using your arms (e.g., wheelchair or bedside chair)?: A Lot Help needed to walk in hospital room?: A Lot Help needed climbing 3-5 steps with a railing? : Total 6 Click Score: 12    End of Session Equipment Utilized During Treatment: Oxygen Activity Tolerance: Patient tolerated treatment well;Patient limited by fatigue Patient left: in chair;with call bell/phone within reach;with nursing/sitter in room (posey chair alarm ready to be turned on, NT still in room and will turn on upon exit) Nurse Communication: Mobility status PT Visit Diagnosis: Unsteadiness on feet (R26.81);Muscle weakness (generalized) (M62.81);Difficulty in walking, not elsewhere classified (R26.2)    Time: 4196-2229 PT Time Calculation (min) (ACUTE ONLY): 27 min   Charges:   PT Evaluation $PT Eval Low Complexity: 1 Low PT Treatments $Therapeutic Activity: 8-22 mins       Farouk Vivero E, PT Acute Rehabilitation Services Pager [phone]  Office [phone]    Allisyn Kunz D Safiya Girdler 06/08/2020, 9:09 AM

## 2020-06-08 NOTE — Progress Notes (Signed)
Pt arrived to 2w19 via stretcher. Report received from Yasmin RN. Pt A&O x 4, IV x 1, NS gtt. Pt oriented to unit. Call bell given to pt.

## 2020-06-08 NOTE — TOC Initial Note (Signed)
Transition of Care [facility]) - Initial/Assessment Note    Patient Details  Name: Renee [name] MRN: [id number] Date of Birth: 08-07-1929  Transition of Care [facility]) CM/SW Contact:    Joanne Chars, LCSW Phone Number: 06/08/2020, 10:55 AM  Clinical Narrative:   CSW met with pt to discuss discharge plan.  Pt reports she already has [facility] in place and is happy with their services.  Pt lives alone, has [place] aide in home 6 days a week, 9am-3pm: [facility], [phone].  Pt has a sister, Richrd Sox and two sisters in law, Laureen Ochs [phone] and Adalberto Cole [phone].  Her sisters in law spend the day with her on Sundays.  Pt thinks the [facility] aide hours could be increased if needed.  Pt is vaccinated for covid and reports she also had her booster shot.  PCP in place.  Pt also has walker, beside commode, rollator in home.  Will continue to follow for discharge needs.               Expected Discharge Plan: [place] Barriers to Discharge: Continued Medical Work up   Patient Goals and CMS Choice Patient states their goals for this hospitalization and ongoing recovery are:: use my walker to get around, independence CMS Medicare.gov Compare Post Acute Care list provided to:: Patient Choice offered to / list presented to : Patient  Expected Discharge Plan and Services Expected Discharge Plan: [place] Choice: [address] [place] arrangements for the past 2 months: Single Family Home                                      Prior Living Arrangements/Services Living arrangements for the past 2 months: Single Family Home Lives with:: Self (has [place] aide 6 days a week and family present for 7th day) Patient language and need for interpreter reviewed:: Yes Do you feel safe going back to the place where you live?: Yes      Need for Family Participation in Patient Care: Yes (Comment) Care giver support system in  place?: Yes (comment) Current home services: Homehealth aide Criminal Activity/Legal Involvement Pertinent to Current Situation/Hospitalization: No - Comment as needed  Activities of Daily Living Home Assistive Devices/Equipment: Eyeglasses, Hearing aid, Shower chair with back ADL Screening (condition at time of admission) Patient's cognitive ability adequate to safely complete daily activities?: Yes Is the patient deaf or have difficulty hearing?: Yes Does the patient have difficulty seeing, even when wearing glasses/contacts?: No Does the patient have difficulty concentrating, remembering, or making decisions?: No Patient able to express need for assistance with ADLs?: Yes Does the patient have difficulty dressing or bathing?: Yes Independently performs ADLs?: No Communication: Independent Dressing (OT): Needs assistance Is this a change from baseline?: Pre-admission baseline Grooming: Needs assistance Is this a change from baseline?: Pre-admission baseline Feeding: Independent Bathing: Needs assistance Is this a change from baseline?: Pre-admission baseline Toileting: Needs assistance Is this a change from baseline?: Pre-admission baseline In/Out Bed: Needs assistance Is this a change from baseline?: Pre-admission baseline Walks in Home: Needs assistance Is this a change from baseline?: Pre-admission baseline Does the patient have difficulty walking or climbing stairs?: Yes Weakness of Legs: None Weakness of Arms/Hands: None  Permission Sought/Granted Permission sought to share information with : Facility Sport and exercise psychologist, Family Supports Permission granted to share information with :  Yes, Verbal Permission Granted  Share Information with NAME: sister Enid Derry, sisters in law: Rollene Fare and [place] granted to share info w AGENCY: [facility]        Emotional Assessment Appearance:: Appears stated age Attitude/Demeanor/Rapport: Engaged Affect (typically  observed): Appropriate, Pleasant Orientation: : Oriented to Self, Oriented to Situation (no information) Alcohol / Substance Use: Not Applicable Psych Involvement: No (comment)  Admission diagnosis:  Pleural effusion [J90] S/P thoracentesis [Z98.890] Sepsis ([place]) [A41.9] Community acquired pneumonia of left lower lobe of lung [J18.9] Sepsis without acute organ dysfunction, due to unspecified organism [facility]) [A41.9] Patient Active Problem List   Diagnosis Date Noted  . Sepsis ([place]) 06/07/2020  . Pleural effusion 05/13/2020  . Frequent UTI 05/13/2020  . Dizziness 02/12/2020  . Normocytic anemia 02/12/2020  . Fall involving sidewalk curb 11/29/2019  . Contusion of back 11/27/2019  . Hyperlipidemia associated with type 2 diabetes mellitus ([place])   . Aortic atherosclerosis ([place]) 07/13/2019  . H/O sepsis 07/01/2019  . CKD (chronic kidney disease) stage 4, GFR 15-29 ml/min (HCC) 07/01/2019  . Lower abdominal pain 05/11/2019  . Routine general medical examination at a health care facility 03/30/2019  . External hemorrhoid 01/17/2018  . History of colitis 01/17/2018  . Generalized weakness 12/24/2016  . Diabetic retinopathy ([place]) 12/11/2016  . History of CVA (cerebrovascular accident) 12/11/2016  . Community acquired pneumonia of left lower lobe of lung 10/07/2015  . Estrogen deficiency 08/30/2015  . Encounter for Medicare annual wellness exam 04/17/2013  . Chest wall pain 04/07/2013  . Mobility impaired 06/26/2011  . History of retinal detachment 01/10/2011  . Sleep apnea 11/28/2010  . Anxiety and depression 08/25/2010  . Hemiplegia, late effect of cerebrovascular disease ([place]) 07/05/2010  . POSTHERPETIC NEURALGIA 11/09/2009  . Renal insufficiency 06/29/2008  . Chronic back pain 01/26/2008  . EDEMA 01/26/2008  . B12 deficiency 01/10/2007  . Type 2 diabetes, controlled, with retinopathy ([place]) 11/27/2006  . Essential hypertension 11/27/2006  . FIBROCYSTIC BREAST DISEASE 11/27/2006   . ROSACEA 11/27/2006  . OSTEOARTHRITIS 11/27/2006  . URINARY INCONTINENCE, MIXED 11/27/2006   PCP:  Abner Greenspan, MD Pharmacy:   [id number] [place], [id number]: [phone]: [phone]    Social Determinants of Health (SDOH) Interventions    Readmission Risk Interventions No flowsheet data found.

## 2020-06-08 NOTE — Evaluation (Signed)
Occupational Therapy Evaluation Patient Details Name: Renee [name] MRN: [id number] DOB: 11-Jan-1929 Today's Date: 06/08/2020    History of Present Illness 86 y.o. female with medical history significant of HTN, IIDM, CKD stage IIIb, HLD, depression/anxiety, frequent UTIs, presented to [facility] on 10/19 with increasing shortness of breath. Sepsis workup, s/p L thoracentesis yielding 450 mL amber fluid. Patient reported that her symptoms started about 1 week ago after she got her COVID booster shot.   Clinical Impression   Patient with functional deficits listed below impacting safety and independence with self care. Patient has care attendant 6 days week from 9-3 to assist with IADLs, dressing, bathing, toileting tasks and patient able to ambulate with walker. Currently patient requires mod A with sit to stand with increased time to steady and difficulty transitioning hand to walker. Patient currently high risk for falls if left alone to toilet when care attendant/family not there, therefore recommend SNF unless family can provide initial 24/7 support then patient would benefit from [facility].     Follow Up Recommendations  SNF;Supervision/Assistance - 24 hour;Other (comment) (vs HH if increased supervision/A available)    Equipment Recommendations  None recommended by OT       Precautions / Restrictions Precautions Precautions: Fall Precaution Comments: sats Restrictions Weight Bearing Restrictions: No      Mobility Bed Mobility      General bed mobility comments: in recliner    Transfers Overall transfer level: Needs assistance Equipment used: Rolling walker (2 wheeled) Transfers: Sit to/from Stand Sit to Stand: Mod assist       General transfer comment: mod A to power up to standing with cues to push from chair, increased time to steady and transition R hand to walker. patient able to take few steps forward/back with min A for balance    Balance Overall balance assessment: Needs  assistance Sitting-balance support: Feet supported Sitting balance-Leahy Scale: Fair    Standing balance support: Bilateral upper extremity supported Standing balance-Leahy Scale: Poor Standing balance comment: reliant on external assist                           ADL either performed or assessed with clinical judgement   ADL Overall ADL's : Needs assistance/impaired     Grooming: Set up;Oral care;Wash/dry face;Sitting Grooming Details (indicate cue type and reason): patient reports she completed g/h at home in sitting  Upper Body Bathing: Minimal assistance;Sitting   Lower Body Bathing: Maximal assistance;Sitting/lateral leans;Sit to/from stand   Upper Body Dressing : Minimal assistance;Sitting   Lower Body Dressing: Maximal assistance;Sitting/lateral leans;Sit to/from stand   Toilet Transfer: Moderate assistance;Stand-pivot;RW;Cueing for safety Toilet Transfer Details (indicate cue type and reason): simulated with sit<> stand from recliner, cues for hand placement and mod A to power up to standing, increased time to steady and transition R hand to walker Toileting- Clothing Manipulation and Hygiene: Maximal assistance;Sit to/from stand       Functional mobility during ADLs: Moderate assistance;Cueing for safety;Cueing for sequencing;Rolling walker General ADL Comments: at baseline patient has assist from caregiver 9-3 6 days a week however due to patient's decreased balance, strength, activity tolerance would currently require increased supervision/assistance to safely perform ADLs such as toileting                  Pertinent Vitals/Pain Pain Assessment: No/denies pain      Hand Dominance Right   Extremity/Trunk Assessment Upper Extremity Assessment Upper Extremity Assessment: Generalized weakness   Lower  Extremity Assessment Lower Extremity Assessment: Defer to PT evaluation    Cervical / Trunk Assessment Cervical / Trunk Assessment: Normal    Communication Communication Communication: HOH   Cognition Arousal/Alertness: Awake/alert Behavior During Therapy: WFL for tasks assessed/performed Overall Cognitive Status: Within Functional Limits for tasks assessed                                 General Comments: Follows commands appropriately   General Comments  VSS            Home Living Family/patient expects to be discharged to:: Private residence Living Arrangements: Alone Available Help at Discharge: Family;Available PRN/intermittently;Personal care attendant Type of Home: House Home Access: Ramped entrance     Home Layout: One level     Bathroom Shower/Tub: Occupational psychologist: Standard Bathroom Accessibility: Yes   Home Equipment: Environmental consultant - 2 wheels;Walker - 4 wheels;Transport chair;Shower seat;Grab bars - tub/shower;Bedside commode;Hand held shower head   Additional Comments: lives alone with [facility] aide x6 hours daily - with pt from 9-3      Prior Functioning/Environment Level of Independence: Needs assistance  Gait / Transfers Assistance Needed: uses RW for ambulation in home, transport chair for appointments and in/out house ADL's / Homemaking Assistance Needed: caregiver assist for meals, in/out of bed, transfers, toileting, dressing            OT Problem List: Decreased strength;Decreased activity tolerance;Impaired balance (sitting and/or standing);Decreased safety awareness;Obesity      OT Treatment/Interventions: Self-care/ADL training;Therapeutic exercise;Energy conservation;DME and/or AE instruction;Therapeutic activities;Patient/family education;Balance training    OT Goals(Current goals can be found in the care plan section) Acute Rehab OT Goals Patient Stated Goal: go home OT Goal Formulation: With patient Time For Goal Achievement: 06/22/20 Potential to Achieve Goals: Good  OT Frequency: Min 2X/week    AM-PAC OT "6 Clicks" Daily Activity     Outcome  Measure Help from another person eating meals?: A Little Help from another person taking care of personal grooming?: A Little Help from another person toileting, which includes using toliet, bedpan, or urinal?: A Lot Help from another person bathing (including washing, rinsing, drying)?: A Lot Help from another person to put on and taking off regular upper body clothing?: A Little Help from another person to put on and taking off regular lower body clothing?: A Lot 6 Click Score: 15   End of Session Equipment Utilized During Treatment: Rolling walker;Oxygen Nurse Communication: Mobility status  Activity Tolerance: Patient tolerated treatment well Patient left: in chair;with call bell/phone within reach;with chair alarm set  OT Visit Diagnosis: Unsteadiness on feet (R26.81);Other abnormalities of gait and mobility (R26.89);Muscle weakness (generalized) (M62.81)                Time: 6203-5597 OT Time Calculation (min): 23 min Charges:  OT General Charges $OT Visit: 1 Visit OT Evaluation $OT Eval Low Complexity: 1 Low OT Treatments $Self Care/Home Management : 8-22 mins  Delbert Phenix OT OT pager: [phone]   Rosemary Holms 06/08/2020, 11:44 AM

## 2020-06-09 ENCOUNTER — Inpatient Hospital Stay (HOSPITAL_COMMUNITY): Payer: Medicare HMO

## 2020-06-09 DIAGNOSIS — J90 Pleural effusion, not elsewhere classified: Secondary | ICD-10-CM

## 2020-06-09 DIAGNOSIS — R509 Fever, unspecified: Secondary | ICD-10-CM

## 2020-06-09 LAB — BASIC METABOLIC PANEL
Anion gap: 11 (ref 5–15)
BUN: 23 mg/dL (ref 8–23)
CO2: 23 mmol/L (ref 22–32)
Calcium: 8.8 mg/dL — ABNORMAL LOW (ref 8.9–10.3)
Chloride: 107 mmol/L (ref 98–111)
Creatinine, Ser: 1.39 mg/dL — ABNORMAL HIGH (ref 0.44–1.00)
GFR, Estimated: 36 mL/min — ABNORMAL LOW (ref >60)
Glucose, Bld: 166 mg/dL — ABNORMAL HIGH (ref 70–99)
Potassium: 3.8 mmol/L (ref 3.5–5.1)
Sodium: 141 mmol/L (ref 135–145)

## 2020-06-09 LAB — ECHOCARDIOGRAM COMPLETE
Area-P 1/2: 3.12 cm2
Calc EF: 63.8 %
Height: 62 in
S' Lateral: 2.71 cm
Single Plane A2C EF: 66.2 %
Single Plane A4C EF: 61.5 %
Weight: 2720 oz

## 2020-06-09 LAB — CBC WITH DIFFERENTIAL/PLATELET
Abs Immature Granulocytes: 0.04 10*3/uL (ref 0.00–0.07)
Basophils Absolute: 0 10*3/uL (ref 0.0–0.1)
Basophils Relative: 0 %
Eosinophils Absolute: 0.4 10*3/uL (ref 0.0–0.5)
Eosinophils Relative: 6 %
HCT: 36.1 % (ref 36.0–46.0)
Hemoglobin: 10.9 g/dL — ABNORMAL LOW (ref 12.0–15.0)
Immature Granulocytes: 1 %
Lymphocytes Relative: 14 %
Lymphs Abs: 1 10*3/uL (ref 0.7–4.0)
MCH: 27.9 pg (ref 26.0–34.0)
MCHC: 30.2 g/dL (ref 30.0–36.0)
MCV: 92.3 fL (ref 80.0–100.0)
Monocytes Absolute: 0.5 10*3/uL (ref 0.1–1.0)
Monocytes Relative: 7 %
Neutro Abs: 5.1 10*3/uL (ref 1.7–7.7)
Neutrophils Relative %: 72 %
Platelets: 148 10*3/uL — ABNORMAL LOW (ref 150–400)
RBC: 3.91 MIL/uL (ref 3.87–5.11)
RDW: 13.7 % (ref 11.5–15.5)
WBC: 7.1 10*3/uL (ref 4.0–10.5)
nRBC: 0 % (ref 0.0–0.2)

## 2020-06-09 LAB — GLUCOSE, CAPILLARY
Glucose-Capillary: 149 mg/dL — ABNORMAL HIGH (ref 70–99)
Glucose-Capillary: 225 mg/dL — ABNORMAL HIGH (ref 70–99)
Glucose-Capillary: 302 mg/dL — ABNORMAL HIGH (ref 70–99)
Glucose-Capillary: 113 mg/dL — ABNORMAL HIGH (ref 70–99)

## 2020-06-09 LAB — PROCALCITONIN: Procalcitonin: 2.85 ng/mL

## 2020-06-09 LAB — PHOSPHORUS: Phosphorus: 3.1 mg/dL (ref 2.5–4.6)

## 2020-06-09 LAB — CYTOLOGY - NON PAP

## 2020-06-09 LAB — MAGNESIUM: Magnesium: 1.7 mg/dL (ref 1.7–2.4)

## 2020-06-09 MED ORDER — AMLODIPINE BESYLATE 10 MG PO TABS
10.0000 mg | ORAL_TABLET | Freq: Every day | ORAL | Status: DC
  Administered 2020-06-09 – 2020-06-11 (×3): 10 mg via ORAL
  Filled 2020-06-09 (×3): qty 1

## 2020-06-09 MED ORDER — GLIPIZIDE ER 2.5 MG PO TB24
2.5000 mg | ORAL_TABLET | Freq: Every day | ORAL | Status: DC
  Administered 2020-06-10 – 2020-06-11 (×2): 2.5 mg via ORAL
  Filled 2020-06-09 (×3): qty 1

## 2020-06-09 MED ORDER — PERFLUTREN LIPID MICROSPHERE
1.0000 mL | INTRAVENOUS | Status: AC | PRN
  Administered 2020-06-09: 2 mL via INTRAVENOUS
  Filled 2020-06-09: qty 10

## 2020-06-09 NOTE — Progress Notes (Signed)
  Echocardiogram 2D Echocardiogram has been performed.  Renee [name] 06/09/2020, 9:47 AM

## 2020-06-09 NOTE — Consult Note (Signed)
NAME:  Renee [name], MRN:  [id number], DOB:  05/26/1929, LOS: 2 ADMISSION DATE:  06/07/2020, CONSULTATION DATE:  06/09/2020 REFERRING MD:  Dr. Pietro Cassis, CHIEF COMPLAINT:  Pleural effusion    Brief History   86 year old female presents with fever, shortness of breath, and cough post Covid vaccination.  Patient has chronic left-sided pleural effusion.  PCCM consulted for further management.  History of present illness   Renee [name] is a 86 year old female with a past medical history significant for type 2 diabetes, hyperlipidemia, hypertension, left ventricular hypertrophy, CKD stage IIIb, sleep apnea, prior stroke, and depression who presented with increased shortness of breath and cough.  Patient reports the symptoms started 1 week after receiving Covid booster shot.  Patient described cough as initially nonproductive but became productive with yellowish sputum.  She reports subjective fever but no chills at home.  Patient was found febrile and tachypneic on admission, she was admitted under hospitalist due to sepsis physiology concerning.   Given presence of chronic pleural effusion and sepsis physiology on admission PCCM was consulted for further management and evaluation of pleural effusion and possible empyema.  Past Medical History  Type 2 diabetes, hyperlipidemia, hypertension, left ventricular hypertrophy, CKD stage IIIb, sleep apnea, prior stroke, and depression   Significant Hospital Events   Admitted 10/19 underwent IR guided thoracentesis   Consults:    Procedures:  Pulmonary  Significant Diagnostic Tests:  Chest x-ray 10/19 > Increased conspicuity of left basilar consolidation. Increased small to moderate left pleural effusion.  Micro Data:  COVID/RVP 10/19 > negative Gram stain pleural 10/19 > Moderate wbc  MRSA PCR 10/19 > Negative  Blood culture 10/19 >  Pleural culture 10/19 > Respiratory culture 10/19  Antimicrobials:  Ceftriaxone 10/20 > Azithromycin 10/20  >  Interim history/subjective:  Seen sitting up in bedside recliner.  States she feels well with no acute complaints.  She reports continued intermittent nonproductive cough.  Objective   Blood pressure (!) 155/119, pulse 65, temperature 98.1 F (36.7 C), temperature source Oral, resp. rate 20, height $RemoveBe'5\' 2"'GKSDVEbwU$  (1.575 m), weight 77.1 kg, SpO2 95 %.        Intake/Output Summary (Last 24 hours) at 06/09/2020 1354 Last data filed at 06/09/2020 0307 Gross per 24 hour  Intake --  Output 100 ml  Net -100 ml   Filed Weights   06/07/20 2325  Weight: 77.1 kg    Examination: General: Very pleasant elderly female sitting up in bedside recliner in no acute distress HEENT: [place]/AT, MM pink/moist, PERRL, mildly hard of hearing Neuro: Alert and oriented x3, nonfocal CV: s1s2 regular rate and rhythm, no murmur, rubs, or gallops,  PULM: Clear to auscultation anteriorly, faint posterior basilar crackles, currently on room air, no increased work of breathing GI: soft, bowel sounds active in all 4 quadrants, non-tender, non-distended Extremities: warm/dry, no edema  Skin: no rashes or lesions  Resolved Hospital Problem list     Assessment & Plan:  Chronic pleural effusion  -History is difficult to obtain from patient and sister.  It appears per chart review patient has been experiencing chronic left pleural effusion for several months. -Per patient she was scheduled to see pulmonology in December for evaluation of effusion -She underwent thoracentesis by interventional radiology day of admission 10/19 with 450 mL of amber cloudy colored fluid removed P: Repeat chest x-ray to evaluate reaccumulation of fluid Transudative per lights criteria No indication for further interventions at this time Continue empiric CAP coverage for a total of 5 days  Follow cultures Given hyperdynamic EF and grade 1 diastolic dysfunction may need gentle PO diuretics as patient is currently asymptomatic this is not  currently indicated   Best practice:  Diet: Heart healthy carb modified Pain/Anxiety/Delirium protocol (if indicated): As needed VAP protocol (if indicated): Not applicable DVT prophylaxis: Heparin GI prophylaxis: PPI Glucose control: SSI Mobility: Up with assistance Code Status: DNR Family Communication: Spoke with patient's Sister Enid Derry for daily update Disposition: Floor  Labs   CBC: Recent Labs  Lab 06/07/20 1145 06/08/20 0333 06/09/20 0849  WBC 19.1* 9.1 7.1  NEUTROABS 18.3* 7.1 5.1  HGB 12.6 10.5* 10.9*  HCT 42.0 33.6* 36.1  MCV 91.1 90.6 92.3  PLT 179 159 148*    Basic Metabolic Panel: Recent Labs  Lab 06/07/20 1145 06/08/20 0333 06/09/20 0849  NA 139 140 141  K 4.1 4.0 3.8  CL 104 108 107  CO2 23 23 23   GLUCOSE 282* 129* 166*  BUN 21 26* 23  CREATININE 1.47* 1.50* 1.39*  CALCIUM 9.1 8.5* 8.8*  MG  --   --  1.7  PHOS  --   --  3.1   GFR: Estimated Creatinine Clearance: 26.4 mL/min (A) (by C-G formula based on SCr of 1.39 mg/dL (H)). Recent Labs  Lab 06/07/20 1145 06/08/20 0333 06/09/20 0849  PROCALCITON  --   --  2.85  WBC 19.1* 9.1 7.1  LATICACIDVEN 1.2  --   --     Liver Function Tests: Recent Labs  Lab 06/07/20 1145  AST 19  ALT 18  ALKPHOS 76  BILITOT 0.9  PROT 5.8*  ALBUMIN 3.5   No results for input(s): LIPASE, AMYLASE in the last 168 hours. No results for input(s): AMMONIA in the last 168 hours.  ABG    Component Value Date/Time   TCO2 27 07/21/2011 1549     Coagulation Profile: Recent Labs  Lab 06/07/20 1735  INR 1.0    Cardiac Enzymes: No results for input(s): CKTOTAL, CKMB, CKMBINDEX, TROPONINI in the last 168 hours.  HbA1C: Hgb A1c MFr Bld  Date/Time Value Ref Range Status  01/05/2020 11:07 AM 6.1 4.6 - 6.5 % Final    Comment:    Glycemic Control Guidelines for People with Diabetes:Non Diabetic:  <6%Goal of Therapy: <7%Additional Action Suggested:  >8%   03/30/2019 11:09 AM 6.0 4.6 - 6.5 % Final     Comment:    Glycemic Control Guidelines for People with Diabetes:Non Diabetic:  <6%Goal of Therapy: <7%Additional Action Suggested:  >8%     CBG: Recent Labs  Lab 06/08/20 1210 06/08/20 1548 06/08/20 1936 06/09/20 0742 06/09/20 1135  GLUCAP 242* 206* 212* 149* 225*    Review of Systems: Positive in bold  Gen: Denies fever, chills, weight change, fatigue, night sweats HEENT: Denies blurred vision, double vision, hearing loss, tinnitus, sinus congestion, rhinorrhea, sore throat, neck stiffness, dysphagia PULM: Denies shortness of breath, cough, sputum production, hemoptysis, wheezing CV: Denies chest pain, edema, orthopnea, paroxysmal nocturnal dyspnea, palpitations GI: Denies abdominal pain, nausea, vomiting, diarrhea, hematochezia, melena, constipation, change in bowel habits GU: Denies dysuria, hematuria, polyuria, oliguria, urethral discharge Endocrine: Denies hot or cold intolerance, polyuria, polyphagia or appetite change Derm: Denies rash, dry skin, scaling or peeling skin change Heme: Denies easy bruising, bleeding, bleeding gums Neuro: Denies headache, numbness, weakness, slurred speech, loss of memory or consciousness  Past Medical History  She,  has a past medical history of Angioedema, Bronchopneumonia (12/11/2016), Degenerative disc disease, Diabetes mellitus, Hyperlipidemia, Hypertension, LVH (left ventricular hypertrophy),  Nasal pruritis, Osteoarthritis, Osteopenia, Renal insufficiency, Sleep apnea, Stroke ([place]) (05/2010), and Vitamin B 12 deficiency (04/08).   Surgical History    Past Surgical History:  Procedure Laterality Date  . ABDOMINAL HYSTERECTOMY     BSO-fibroids  . APPENDECTOMY    . BACK SURGERY    . COLON SURGERY     due to punctured intestines  . EYE SURGERY     cataract extraction  . IR THORACENTESIS ASP PLEURAL SPACE W/IMG GUIDE  06/07/2020  . KNEE SURGERY     arthroscope  . PARS PLANA VITRECTOMY  07/31/2011   Procedure: PARS PLANA VITRECTOMY  WITH 25 GAUGE;  Surgeon: [name], MD;  Location: [facility];  Service: Ophthalmology;  Laterality: Right;  REMOVAL OF SILICONE OIL AND LASER RIGHT EYE  . RETINAL DETACHMENT SURGERY  02/18/11   times 2  . SPINE SURGERY  08/09   spinal decompression surgery     Social History   reports that she has never smoked. She has never used smokeless tobacco. She reports that she does not drink alcohol and does not use drugs.   Family History   Her family history includes COPD in her brother; Cancer in her sister; Heart disease in her sister.   Allergies Allergies  Allergen Reactions  . Diclofenac Sodium Other (See Comments)    REACTION: angioedema  . Pioglitazone Swelling     Home Medications  Prior to Admission medications   Medication Sig Start Date End Date Taking? Authorizing Provider  acetaminophen (TYLENOL) 325 MG tablet Take 650 mg by mouth at bedtime.    Yes [provider]  albuterol (VENTOLIN HFA) 108 (90 Base) MCG/ACT inhaler Inhale 2 puffs into the lungs every 4 (four) hours as needed for wheezing or shortness of breath. 07/13/19  Yes Tower, Wynelle Fanny, MD  ALPRAZolam (XANAX) 0.5 MG tablet TAKE 1/2 TO 1 TABLET BY MOUTH TWICE A DAY AS NEEDED FOR ANXIETY Patient taking differently: Take 0.25-0.5 mg by mouth 2 (two) times daily as needed for anxiety. TAKE 1/2 TO 1 TABLET BY MOUTH TWICE A DAY AS NEEDED FOR ANXIETY 05/16/20  Yes Tower, Marne A, MD  amLODipine (NORVASC) 10 MG tablet Take 1 tablet (10 mg total) by mouth daily. 09/30/18  Yes Tower, Wynelle Fanny, MD  aspirin 325 MG tablet Take 325 mg by mouth daily.    Yes [provider]  calcium-vitamin D (OSCAL WITH D) 500-200 MG-UNIT per tablet Take 1 tablet by mouth daily.    Yes [provider]  Cholecalciferol (VITAMIN D) 1000 UNITS capsule Take 1,000 Units by mouth daily.    Yes [provider]  Cranberry 500 MG TABS Take 500 mg by mouth in the morning and at bedtime.   Yes [provider]   docusate sodium (COLACE) 100 MG capsule Take 200 mg by mouth at bedtime.   Yes [provider]  ferrous sulfate 325 (65 FE) MG tablet Take 325 mg by mouth 2 (two) times daily.   Yes [provider]  gemfibrozil (LOPID) 600 MG tablet TAKE 1 TABLET BY MOUTH EVERY DAY Patient taking differently: Take 600 mg by mouth daily.  09/08/19  Yes Tower, Marne A, MD  glipiZIDE (GLUCOTROL XL) 2.5 MG 24 hr tablet TAKE 1 TABLET BY MOUTH EVERY DAY WITH BREAKFAST Patient taking differently: Take 2.5 mg by mouth daily with breakfast.  07/20/19  Yes Tower, Wynelle Fanny, MD  hydrALAZINE (APRESOLINE) 100 MG tablet Take 100 mg by mouth 3 (three) times daily.  08/30/14  Yes [provider]  metoprolol succinate (TOPROL-XL) 25 MG 24 hr tablet Take 0.5 tablets (12.5 mg total) by mouth 2 (two) times daily. 09/30/18  Yes Tower, Wynelle Fanny, MD  mirtazapine (REMERON) 15 MG tablet TAKE 1/2 TABLET BY MOUTH AT BEDTIME Patient taking differently: Take 7.5 mg by mouth at bedtime.  06/16/19  Yes Tower, Wynelle Fanny, MD  Multiple Vitamin (MULTIVITAMIN) capsule Take 1 capsule by mouth daily.    Yes [provider]  Multiple Vitamins-Minerals (PRESERVISION AREDS 2+MULTI VIT) CAPS Take 1 capsule by mouth in the morning and at bedtime.   Yes [provider]  nitrofurantoin, macrocrystal-monohydrate, (MACROBID) 100 MG capsule Take 1 capsule (100 mg total) by mouth 2 (two) times daily. 06/06/20  Yes Tower, Marne A, MD  Olopatadine HCl (PATADAY OP) Place 1 drop into both eyes daily.   Yes [provider]  sertraline (ZOLOFT) 50 MG tablet TAKE 1 TABLET BY MOUTH EVERY DAY Patient taking differently: Take 50 mg by mouth daily.  09/08/19  Yes Tower, Wynelle Fanny, MD  trandolapril (MAVIK) 4 MG tablet Take 1 tablet (4 mg total) by mouth daily. 09/30/18  Yes Tower, Wynelle Fanny, MD  triamcinolone (NASACORT) 55 MCG/ACT AERO nasal inhaler Place 2 sprays into the nose daily. Patient taking differently: Place 2 sprays into  the nose at bedtime.  03/28/20  Yes [name], MD  vitamin B-12 (CYANOCOBALAMIN) 1000 MCG tablet Take 1,000 mcg by mouth daily.    Yes [provider]  Blood Glucose Monitoring Suppl (ONE TOUCH ULTRA 2) w/Device KIT Check blood sugar once daily and as directed. Dx E11.9 08/27/18   Tower, Wynelle Fanny, MD  OneTouch Delica Lancets 11O MISC CHECK BLOOD SUGAR ONCE DAILY AND AS DIRECTED. DX E11.9 Patient taking differently: 1 Device by Other route daily.  10/23/19   Tower, Wynelle Fanny, MD  ONETOUCH ULTRA test strip CHECK BLOOD SUGAR ONCE DAILY AND AS DIRECTED. DX E11.9 04/05/20   Tower, Wynelle Fanny, MD     Signature:   Johnsie Cancel, NP-C [facility] Contact / Pager information can be found on Amion  06/09/2020, 2:49 PM

## 2020-06-09 NOTE — Progress Notes (Signed)
PROGRESS NOTE  Renee [name]  DOB: 1928/12/07  PCP: Abner Greenspan, MD DVV:[id number]  DOA: 06/07/2020  LOS: 2 days   Chief Complaint  Patient presents with  . Shortness of Breath    Brief narrative: Patient is a 85 y.o. female with history of HTN, DM, HLD, CKD 3B, chronic left-sided pleural effusion, depression/anxiety, frequent UTIs (on antibiotics prior to this hospital stay). Patient presented to the ED on 10/19 with fever, shortness of breath and cough.  Symptoms started a week prior after getting Covid shot.  Initially dry cough and later became productive with yellowish sputum. Also endorsed 1 week history of dysuria. Urine culture on 10/11 as an outpatient was positive for E. coli pansensitive, and was started on Macrobid which she apparently started taking only on 10/18.  In the ED, patient had a temperature of 101.1, she was tachypneic, required 2 L oxygen nasal cannula. WC count 19.1, creatinine 1.4, glucose 282, lactic acid normal. Chest x-ray showed increasing left pleural effusion and lower field consolidation. Patient was admitted to hospital service for pneumonia.  Subjective: Patient was seen and examined this morning. Chart reviewed. No fever, heart rate in 60s, blood pressure elevated overnight to 170s, on 2 L oxygen by nasal cannula  Assessment/Plan: Sepsis - POA Community-acquired pneumonia -Presented with fever, leukocytosis, elevated creatinine, chest x-ray finding with pneumonia -Sepsis parameters improving on IV Rocephin/azithromycin. -Adequately hydrated. -Prednisone taper -Awaiting sputum culture, urine strep antigen.  Acute on chronic left-sided pleural effusion -?  Cause.  Underwent 450 mL of thoracentesis by IR on 10/20. -Pending echocardiogram. -Patient needs pulmonary appointment as an outpatient.  Family requests for consult while she is in the hospital.  Acute respiratory failure with hypoxia -Secondary to pneumonia and effusion. -On  low-flow oxygen by nasal cannula.  Wean down as tolerated.  CKD stage IIIb -Creatinine at baseline seems to be less than 1.5.  Creatinine currently remains within baseline. Recent Labs    07/08/19 0846 07/13/19 1114 10/30/19 1355 10/31/19 0235 01/05/20 1107 02/10/20 1123 02/12/20 1331 02/13/20 0304 06/07/20 1145 06/08/20 0333  BUN 27* 26* 19 19 25* 18 15 15 21  26*  CREATININE 1.65* 1.46* 1.40* 1.39* 1.44* 1.42* 1.29* 1.18* 1.47* 1.50*   Recurrent UTI -Recent urine culture from 10/11 with E. coli.  She started Macrobid on 10/18. -Currently on IV Rocephin. -reportedly this is patient's fourth UTI this year.    Essential hypertension -Home meds include amlodipine 10 mg daily, hydralazine 100 mg 3 times daily, metoprolol 12.5 mg twice daily, trandolapril 4 mg daily. -Patient is currently continued on metoprolol, hydralazine and trandolapril.  Blood pressure trending up.  Resume amlodipine this morning.    DM 2 -A1c 6.19 Dec 2019 -Patient is on glipizide 2.5 mill daily at home. -Currently blood sugar level is running high mostly over 200 probably due to prednisone. -Resume glipizide.  Continue sliding-scale insulin with Accu-Cheks. Recent Labs  Lab 06/08/20 0720 06/08/20 1210 06/08/20 1548 06/08/20 1936 06/09/20 0742  GLUCAP 139* 242* 206* 212* 149*   Hyperlipidemia -Resume gemfibrozil  Anxiety/depression -Restart as needed Xanax, Remeron and SSRI  Lung nodules -recent CT scan of chest on 10/5 showed few small pulmonary nodules measuring up to 4 mm in the right lung, one of which was included in the field of view on prior abdominal CT and is unchanged. No follow-up needed if patient is low-risk (and has no known or suspected primary neoplasm). Non-contrast chest CT can be considered in 12 months if patient is high-risk.  Chronic anemia -Continue iron and vitamin B12 supplement.  Hemoglobin stable.  Mobility: Encourage ambulation Code Status:   Code Status: DNR    Nutritional status: Body mass index is 31.09 kg/m.     Diet Order            Diet heart healthy/carb modified Room service appropriate? Yes; Fluid consistency: Thin  Diet effective now                 DVT prophylaxis: heparin injection 5,000 Units Start: 06/07/20 1400   Antimicrobials:  IV Rocephin/IV azithromycin Fluid: None Consultants: Pulmonary Family Communication:  Caregiver at bedside  Status is: Inpatient  Remains inpatient appropriate because:Persistent severe electrolyte disturbances   Dispo: The patient is from: Home              Anticipated d/c is to: Home              Anticipated d/c date is: 2 days              Patient currently is not medically stable to d/c.       Infusions:  . azithromycin 500 mg (06/08/20 1359)  . cefTRIAXone (ROCEPHIN)  IV 2 g (06/08/20 1300)    Scheduled Meds: . albuterol  2.5 mg Nebulization Once  . aspirin  325 mg Oral Daily  . calcium-vitamin D  1 tablet Oral Daily  . cholecalciferol  1,000 Units Oral Daily  . docusate sodium  200 mg Oral QHS  . ferrous sulfate  325 mg Oral BID  . gemfibrozil  600 mg Oral Daily  . heparin  5,000 Units Subcutaneous Q12H  . hydrALAZINE  100 mg Oral TID  . insulin aspart  0-9 Units Subcutaneous TID WC  . ipratropium-albuterol  3 mL Nebulization BID  . metoprolol tartrate  12.5 mg Oral BID  . mirtazapine  7.5 mg Oral QHS  . olopatadine  1 drop Both Eyes Daily  . predniSONE  30 mg Oral Q breakfast  . sertraline  50 mg Oral Daily  . trandolapril  4 mg Oral Daily  . triamcinolone  2 spray Nasal Daily  . vitamin B-12  1,000 mcg Oral Daily    Antimicrobials: Anti-infectives (From admission, onward)   Start     Dose/Rate Route Frequency Ordered Stop   06/08/20 1230  azithromycin (ZITHROMAX) 500 mg in sodium chloride 0.9 % 250 mL IVPB        500 mg 250 mL/hr over 60 Minutes Intravenous Every 24 hours 06/07/20 1352     06/08/20 1200  cefTRIAXone (ROCEPHIN) 2 g in sodium chloride 0.9 %  100 mL IVPB        2 g 200 mL/hr over 30 Minutes Intravenous Every 24 hours 06/07/20 1352     06/07/20 1400  cefTRIAXone (ROCEPHIN) 2 g in sodium chloride 0.9 % 100 mL IVPB  Status:  Discontinued        2 g 200 mL/hr over 30 Minutes Intravenous Every 24 hours 06/07/20 1351 06/07/20 1352   06/07/20 1400  azithromycin (ZITHROMAX) 500 mg in sodium chloride 0.9 % 250 mL IVPB  Status:  Discontinued        500 mg 250 mL/hr over 60 Minutes Intravenous Every 24 hours 06/07/20 1351 06/07/20 1352   06/07/20 1045  cefTRIAXone (ROCEPHIN) 1 g in sodium chloride 0.9 % 100 mL IVPB        1 g 200 mL/hr over 30 Minutes Intravenous  Once 06/07/20 1040 06/07/20 1231   06/07/20  1045  azithromycin (ZITHROMAX) 500 mg in sodium chloride 0.9 % 250 mL IVPB        500 mg 250 mL/hr over 60 Minutes Intravenous  Once 06/07/20 1040 06/07/20 1329      PRN meds: acetaminophen **OR** acetaminophen, ALPRAZolam, guaiFENesin-dextromethorphan, ipratropium-albuterol, ondansetron **OR** ondansetron (ZOFRAN) IV   Objective: Vitals:   06/09/20 0404 06/09/20 0717  BP: (!) 142/68   Pulse: 76 67  Resp: 20 20  Temp: (!) 97.4 F (36.3 C)   SpO2: 96% 98%    Intake/Output Summary (Last 24 hours) at 06/09/2020 0756 Last data filed at 06/09/2020 0307 Gross per 24 hour  Intake --  Output 100 ml  Net -100 ml   Filed Weights   06/07/20 2325  Weight: 77.1 kg   Weight change:  Body mass index is 31.09 kg/m.   Physical Exam: General exam: Appears calm and comfortable.  Not in physical distress Skin: No rashes, lesions or ulcers. HEENT: Atraumatic, normocephalic, supple neck, no obvious bleeding Lungs: Clear to auscultation bilaterally CVS: Regular rate and rhythm, normal GI/Abd soft, nontender, nondistended, soft. CNS: Alert, awake, oriented x3, hard of hearing Psychiatry: Mood appropriate Extremities: No pedal edema, no calf tenderness  Data Review: I have personally reviewed the laboratory data and studies  available.  Recent Labs  Lab 06/07/20 1145 06/08/20 0333  WBC 19.1* 9.1  NEUTROABS 18.3* 7.1  HGB 12.6 10.5*  HCT 42.0 33.6*  MCV 91.1 90.6  PLT 179 159   Recent Labs  Lab 06/07/20 1145 06/08/20 0333  NA 139 140  K 4.1 4.0  CL 104 108  CO2 23 23  GLUCOSE 282* 129*  BUN 21 26*  CREATININE 1.47* 1.50*  CALCIUM 9.1 8.5*    F/u labs ordered  Signed, Terrilee Croak, MD [facility] 06/09/2020

## 2020-06-10 LAB — CULTURE, RESPIRATORY W GRAM STAIN: Culture: NORMAL

## 2020-06-10 LAB — GLUCOSE, CAPILLARY
Glucose-Capillary: 141 mg/dL — ABNORMAL HIGH (ref 70–99)
Glucose-Capillary: 229 mg/dL — ABNORMAL HIGH (ref 70–99)
Glucose-Capillary: 285 mg/dL — ABNORMAL HIGH (ref 70–99)
Glucose-Capillary: 215 mg/dL — ABNORMAL HIGH (ref 70–99)

## 2020-06-10 LAB — BASIC METABOLIC PANEL
Anion gap: 7 (ref 5–15)
BUN: 27 mg/dL — ABNORMAL HIGH (ref 8–23)
CO2: 25 mmol/L (ref 22–32)
Calcium: 8.7 mg/dL — ABNORMAL LOW (ref 8.9–10.3)
Chloride: 109 mmol/L (ref 98–111)
Creatinine, Ser: 1.49 mg/dL — ABNORMAL HIGH (ref 0.44–1.00)
GFR, Estimated: 33 mL/min — ABNORMAL LOW (ref >60)
Glucose, Bld: 112 mg/dL — ABNORMAL HIGH (ref 70–99)
Potassium: 3.9 mmol/L (ref 3.5–5.1)
Sodium: 141 mmol/L (ref 135–145)

## 2020-06-10 LAB — CBC WITH DIFFERENTIAL/PLATELET
Abs Immature Granulocytes: 0.05 10*3/uL (ref 0.00–0.07)
Basophils Absolute: 0 10*3/uL (ref 0.0–0.1)
Basophils Relative: 0 %
Eosinophils Absolute: 0 10*3/uL (ref 0.0–0.5)
Eosinophils Relative: 0 %
HCT: 32.6 % — ABNORMAL LOW (ref 36.0–46.0)
Hemoglobin: 10.1 g/dL — ABNORMAL LOW (ref 12.0–15.0)
Immature Granulocytes: 1 %
Lymphocytes Relative: 12 %
Lymphs Abs: 0.8 10*3/uL (ref 0.7–4.0)
MCH: 28 pg (ref 26.0–34.0)
MCHC: 31 g/dL (ref 30.0–36.0)
MCV: 90.3 fL (ref 80.0–100.0)
Monocytes Absolute: 0.5 10*3/uL (ref 0.1–1.0)
Monocytes Relative: 7 %
Neutro Abs: 5.6 10*3/uL (ref 1.7–7.7)
Neutrophils Relative %: 80 %
Platelets: 182 10*3/uL (ref 150–400)
RBC: 3.61 MIL/uL — ABNORMAL LOW (ref 3.87–5.11)
RDW: 13.5 % (ref 11.5–15.5)
WBC: 7.1 10*3/uL (ref 4.0–10.5)
nRBC: 0 % (ref 0.0–0.2)

## 2020-06-10 LAB — PROCALCITONIN: Procalcitonin: 2.06 ng/mL

## 2020-06-10 NOTE — TOC Progression Note (Signed)
Transition of Care [facility]) - Progression Note    Patient Details  Name: Renee [name] MRN: [id number] Date of Birth: July 07, 1929  Transition of Care [facility]) CM/SW Contact  Joanne Chars, LCSW Phone Number: 06/10/2020, 2:03 PM  Clinical Narrative:   CSW spoke with pt sister, Richrd Sox.  She reports that it is possible to increase in home care to 24/7 in order for pt to come home with [facility].  Spoke with Ms. Huffines later and she had made the arrangements.  MD reports DC tomorrow, sister will have this in place for discharge Saturday.  [facility] informed.  No equipment needs.    Expected Discharge Plan: [place] Barriers to Discharge: Continued Medical Work up  Expected Discharge Plan and Services Expected Discharge Plan: [place] Choice: [place] arrangements for the past 2 months: Single Family Home                                       Social Determinants of Health (SDOH) Interventions    Readmission Risk Interventions No flowsheet data found.

## 2020-06-10 NOTE — Care Management Important Message (Signed)
Important Message  Patient Details  Name: Renee [name] MRN: [id number] Date of Birth: 1929/07/09   Medicare Important Message Given:  Yes     [name] 06/10/2020, 2:52 PM

## 2020-06-10 NOTE — Progress Notes (Signed)
PROGRESS NOTE  Renee [name]  DOB: September 14, 1928  PCP: Renee Greenspan, MD NID:[id number]  DOA: 06/07/2020  LOS: 3 days   Chief Complaint  Patient presents with  . Shortness of Breath    Brief narrative: Patient is a 86 y.o. female with history of HTN, DM, HLD, CKD 3B, chronic left-sided pleural effusion, depression/anxiety, frequent UTIs (on antibiotics prior to this hospital stay). Patient presented to the ED on 10/19 with fever, shortness of breath and cough.  Symptoms started a week prior after getting Covid shot.  Initially dry cough and later became productive with yellowish sputum. Also endorsed 1 week history of dysuria. Urine culture on 10/11 as an outpatient was positive for E. coli pansensitive, and was started on Macrobid which she apparently started taking only on 10/18.  In the ED, patient had a temperature of 101.1, she was tachypneic, required 2 L oxygen nasal cannula. WC count 19.1, creatinine 1.4, glucose 282, lactic acid normal. Chest x-ray showed increasing left pleural effusion and lower field consolidation. Patient was admitted to hospital service for pneumonia.  Subjective: Patient was seen and examined this morning. Sitting up in chair.  Not in distress.  On low-flow oxygen by nasal cannula.  PT eval obtained.   Patient was unsteady, patient does not feel comfortable going home today because she does not have 24-hour supervision at home.  Assessment/Plan: Sepsis - POA Community-acquired pneumonia -Presented with fever, leukocytosis, elevated creatinine, chest x-ray finding with pneumonia -Sepsis parameters improving on IV Rocephin/azithromycin. -Adequately hydrated. -Continue prednisone taper -Awaiting sputum culture, urine strep antigen.  Acute on chronic left-sided pleural effusion -?  Cause.  Underwent 450 mL of thoracentesis by IR on 10/20. -Pulmonary consultation appreciated.  Follow-up as an outpatient. -Echocardiogram showed EF of 65 to 70% with  mild concentric LVH, grade 1 diastolic dysfunction.  Acute respiratory failure with hypoxia -Secondary to pneumonia and effusion. -On low-flow oxygen by nasal cannula.  Wean down as tolerated.  CKD stage IIIb -Creatinine at baseline seems to be less than 1.5.  Creatinine currently remains within baseline. Recent Labs    10/30/19 1355 10/31/19 0235 01/05/20 1107 02/10/20 1123 02/12/20 1331 02/13/20 0304 06/07/20 1145 06/08/20 0333 06/09/20 0849 06/10/20 0038  BUN 19 19 25* 18 15 15 21  26* 23 27*  CREATININE 1.40* 1.39* 1.44* 1.42* 1.29* 1.18* 1.47* 1.50* 1.39* 1.49*   Recurrent UTI -Recent urine culture from 10/11 with E. coli.  She started Macrobid on 10/18. -Currently on IV Rocephin. -reportedly this is patient's fourth UTI this year.    Essential hypertension -Home meds include amlodipine 10 mg daily, hydralazine 100 mg 3 times daily, metoprolol 12.5 mg twice daily, trandolapril 4 mg daily. -Patient is currently continued on metoprolol, hydralazine and trandolapril.  Blood pressure trending up.  Resume amlodipine this morning.    DM 2 -A1c 6.19 Dec 2019 -Currently blood sugar level is running high mostly over 200 probably due to prednisone. -Continue glipizide.  Continue sliding-scale insulin with Accu-Cheks. -Prednisone dose being tapered down.Marland Kitchen Recent Labs  Lab 06/09/20 1135 06/09/20 1552 06/09/20 2135 06/10/20 0721 06/10/20 1154  GLUCAP 225* 302* 113* 141* 229*   Hyperlipidemia -Continue gemfibrozil  Anxiety/depression -Continue as needed Xanax, Remeron and SSRI  Lung nodules -recent CT scan of chest on 10/5 showed few small pulmonary nodules measuring up to 4 mm in the right lung, one of which was included in the field of view on prior abdominal CT and is unchanged. No follow-up needed if patient is low-risk (and  has no known or suspected primary neoplasm). Non-contrast chest CT can be considered in 12 months if patient is high-risk.   Chronic  anemia -Continue iron and vitamin B12 supplement.  Hemoglobin stable.  Mobility: Encourage ambulation Code Status:   Code Status: DNR  Nutritional status: Body mass index is 31.09 kg/m.     Diet Order            Diet heart healthy/carb modified Room service appropriate? Yes; Fluid consistency: Thin  Diet effective now                 DVT prophylaxis: heparin injection 5,000 Units Start: 06/07/20 1400   Antimicrobials:  IV Rocephin/IV azithromycin Fluid: None Consultants: Pulmonary Family Communication:  None at bedside.  Status is: Inpatient  Remains inpatient appropriate because -patient had 24 hour supervision.  Family arranging that.   Dispo: The patient is from: Home              Anticipated d/c is to: Home              Anticipated d/c date is: Tomorrow              Patient currently is not medically stable to d/c.       Infusions:  . azithromycin 500 mg (06/10/20 1150)  . cefTRIAXone (ROCEPHIN)  IV 2 g (06/10/20 1503)    Scheduled Meds: . albuterol  2.5 mg Nebulization Once  . amLODipine  10 mg Oral Daily  . aspirin  325 mg Oral Daily  . calcium-vitamin D  1 tablet Oral Daily  . cholecalciferol  1,000 Units Oral Daily  . docusate sodium  200 mg Oral QHS  . ferrous sulfate  325 mg Oral BID  . gemfibrozil  600 mg Oral Daily  . glipiZIDE  2.5 mg Oral Q breakfast  . heparin  5,000 Units Subcutaneous Q12H  . hydrALAZINE  100 mg Oral TID  . insulin aspart  0-9 Units Subcutaneous TID WC  . ipratropium-albuterol  3 mL Nebulization BID  . metoprolol tartrate  12.5 mg Oral BID  . mirtazapine  7.5 mg Oral QHS  . olopatadine  1 drop Both Eyes Daily  . predniSONE  30 mg Oral Q breakfast  . sertraline  50 mg Oral Daily  . trandolapril  4 mg Oral Daily  . triamcinolone  2 spray Nasal Daily  . vitamin B-12  1,000 mcg Oral Daily    Antimicrobials: Anti-infectives (From admission, onward)   Start     Dose/Rate Route Frequency Ordered Stop   06/08/20 1230   azithromycin (ZITHROMAX) 500 mg in sodium chloride 0.9 % 250 mL IVPB        500 mg 250 mL/hr over 60 Minutes Intravenous Every 24 hours 06/07/20 1352     06/08/20 1200  cefTRIAXone (ROCEPHIN) 2 g in sodium chloride 0.9 % 100 mL IVPB        2 g 200 mL/hr over 30 Minutes Intravenous Every 24 hours 06/07/20 1352     06/07/20 1400  cefTRIAXone (ROCEPHIN) 2 g in sodium chloride 0.9 % 100 mL IVPB  Status:  Discontinued        2 g 200 mL/hr over 30 Minutes Intravenous Every 24 hours 06/07/20 1351 06/07/20 1352   06/07/20 1400  azithromycin (ZITHROMAX) 500 mg in sodium chloride 0.9 % 250 mL IVPB  Status:  Discontinued        500 mg 250 mL/hr over 60 Minutes Intravenous Every 24 hours 06/07/20 1351  06/07/20 1352   06/07/20 1045  cefTRIAXone (ROCEPHIN) 1 g in sodium chloride 0.9 % 100 mL IVPB        1 g 200 mL/hr over 30 Minutes Intravenous  Once 06/07/20 1040 06/07/20 1231   06/07/20 1045  azithromycin (ZITHROMAX) 500 mg in sodium chloride 0.9 % 250 mL IVPB        500 mg 250 mL/hr over 60 Minutes Intravenous  Once 06/07/20 1040 06/07/20 1329      PRN meds: acetaminophen **OR** acetaminophen, ALPRAZolam, guaiFENesin-dextromethorphan, ipratropium-albuterol, ondansetron **OR** ondansetron (ZOFRAN) IV   Objective: Vitals:   06/10/20 1157 06/10/20 1229  BP: (!) 151/69   Pulse: 60   Resp: (!) 22   Temp: 98.2 F (36.8 C)   SpO2:  (!) 9%    Intake/Output Summary (Last 24 hours) at 06/10/2020 1619 Last data filed at 06/10/2020 1304 Gross per 24 hour  Intake --  Output 500 ml  Net -500 ml   Filed Weights   06/07/20 2325  Weight: 77.1 kg   Weight change:  Body mass index is 31.09 kg/m.   Physical Exam: General exam: Appears calm and comfortable.  Not in physical distress Skin: No rashes, lesions or ulcers. HEENT: Atraumatic, normocephalic, supple neck, no obvious bleeding Lungs: Clear to auscultation bilaterally CVS: Regular rate and rhythm, normal GI/Abd soft, nontender,  nondistended, soft. CNS: Alert, awake, oriented x3, hard of hearing Psychiatry: Depressed look Extremities: No pedal edema, no calf tenderness  Data Review: I have personally reviewed the laboratory data and studies available.  Recent Labs  Lab 06/07/20 1145 06/08/20 0333 06/09/20 0849 06/10/20 0038  WBC 19.1* 9.1 7.1 7.1  NEUTROABS 18.3* 7.1 5.1 5.6  HGB 12.6 10.5* 10.9* 10.1*  HCT 42.0 33.6* 36.1 32.6*  MCV 91.1 90.6 92.3 90.3  PLT 179 159 148* 182   Recent Labs  Lab 06/07/20 1145 06/08/20 0333 06/09/20 0849 06/10/20 0038  NA 139 140 141 141  K 4.1 4.0 3.8 3.9  CL 104 108 107 109  CO2 23 23 23 25   GLUCOSE 282* 129* 166* 112*  BUN 21 26* 23 27*  CREATININE 1.47* 1.50* 1.39* 1.49*  CALCIUM 9.1 8.5* 8.8* 8.7*  MG  --   --  1.7  --   PHOS  --   --  3.1  --     F/u labs ordered  Signed, Terrilee Croak, MD [facility] 06/10/2020

## 2020-06-10 NOTE — Progress Notes (Signed)
Patient assessed.  Reviewed LPN documentation, and agree with the findings.  

## 2020-06-10 NOTE — Progress Notes (Signed)
Physical Therapy Treatment Patient Details Name: Renee [name] MRN: [id number] DOB: December 09, 1928 Today's Date: 06/10/2020    History of Present Illness 86 y.o. female with medical history significant of HTN, IIDM, CKD stage IIIb, HLD, depression/anxiety, frequent UTIs, presented to [facility] on 10/19 with increasing shortness of breath. Sepsis workup, s/p L thoracentesis yielding 450 mL amber fluid. Patient reported that her symptoms started about 1 week ago after she got her COVID booster shot.    PT Comments    Pt with improved participation in session; pt with able to perform sit<>stand with min A; pt requiring mod A due to LOB when backing up 3 steps x 2 trials with RW; pt requiring min A for balance (marching with RW) activity due to decreased weight shifting over LLE; pt continues to demonstrate deficits in balance, strength, coordination, endurance, gait and safety to maximize independence with functional mobiltiy; due to balance deficits pt will benefit from ST SNF rehab to improve independence prior to return home.    Follow Up Recommendations  SNF     Equipment Recommendations  None recommended by PT    Recommendations for Other Services       Precautions / Restrictions Precautions Precautions: Fall Precaution Comments: sats Restrictions Weight Bearing Restrictions: No    Mobility  Bed Mobility               General bed mobility comments: in recliner  Transfers Overall transfer level: Needs assistance Equipment used: Rolling walker (2 wheeled) Transfers: Sit to/from Stand Sit to Stand: Min assist            Ambulation/Gait Ambulation/Gait assistance: Min assist;Mod assist Gait Distance (Feet): 4 Feet Assistive device: Rolling walker (2 wheeled)       General Gait Details: performed 3 steps forward and 3 backwards, min A going forward and mod A coming back due to LOB wtih both trials   Stairs             Wheelchair Mobility    Modified  Rankin (Stroke Patients Only)       Balance               Standing balance comment: performed standing marches wtih RW with min A needed due to poor weight shift over LLE                            Cognition                                              Exercises General Exercises - Lower Extremity Ankle Circles/Pumps: AROM;Both;10 reps;Supine Quad Sets: AROM;Both;10 reps;Supine Gluteal Sets: AROM;Both;10 reps;Supine Heel Slides: AROM;Both;10 reps;Supine Hip ABduction/ADduction: AROM;Both;10 reps;Supine Straight Leg Raises: AROM;Both;10 reps;Supine    General Comments        Pertinent Vitals/Pain Pain Assessment: No/denies pain    Home Living                      Prior Function            PT Goals (current goals can now be found in the care plan section) Acute Rehab PT Goals PT Goal Formulation: With patient Time For Goal Achievement: 06/22/20 Potential to Achieve Goals: Good Progress towards PT goals: Progressing toward goals    Frequency    Min 3X/week  PT Plan Current plan remains appropriate    Co-evaluation              AM-PAC PT "6 Clicks" Mobility   Outcome Measure  Help needed turning from your back to your side while in a flat bed without using bedrails?: A Little Help needed moving from lying on your back to sitting on the side of a flat bed without using bedrails?: A Lot Help needed moving to and from a bed to a chair (including a wheelchair)?: A Lot Help needed standing up from a chair using your arms (e.g., wheelchair or bedside chair)?: A Little Help needed to walk in hospital room?: A Lot Help needed climbing 3-5 steps with a railing? : Total 6 Click Score: 13    End of Session Equipment Utilized During Treatment: Oxygen;Gait belt Activity Tolerance: Patient tolerated treatment well;Patient limited by fatigue Patient left: in chair;with call bell/phone within reach;with  nursing/sitter in room Nurse Communication: Mobility status PT Visit Diagnosis: Unsteadiness on feet (R26.81);Muscle weakness (generalized) (M62.81);Difficulty in walking, not elsewhere classified (R26.2)     Time: 2493-2419 PT Time Calculation (min) (ACUTE ONLY): 20 min  Charges:  $Therapeutic Exercise: 8-22 mins                     Lyanne Co, DPT Acute Rehabilitation Services [phone]   [place] 06/10/2020, 12:33 PM

## 2020-06-11 LAB — CBC WITH DIFFERENTIAL/PLATELET
Abs Immature Granulocytes: 0.07 10*3/uL (ref 0.00–0.07)
Basophils Absolute: 0 10*3/uL (ref 0.0–0.1)
Basophils Relative: 1 %
Eosinophils Absolute: 0 10*3/uL (ref 0.0–0.5)
Eosinophils Relative: 1 %
HCT: 32.5 % — ABNORMAL LOW (ref 36.0–46.0)
Hemoglobin: 10 g/dL — ABNORMAL LOW (ref 12.0–15.0)
Immature Granulocytes: 1 %
Lymphocytes Relative: 15 %
Lymphs Abs: 0.9 10*3/uL (ref 0.7–4.0)
MCH: 28 pg (ref 26.0–34.0)
MCHC: 30.8 g/dL (ref 30.0–36.0)
MCV: 91 fL (ref 80.0–100.0)
Monocytes Absolute: 0.4 10*3/uL (ref 0.1–1.0)
Monocytes Relative: 6 %
Neutro Abs: 4.6 10*3/uL (ref 1.7–7.7)
Neutrophils Relative %: 76 %
Platelets: 170 10*3/uL (ref 150–400)
RBC: 3.57 MIL/uL — ABNORMAL LOW (ref 3.87–5.11)
RDW: 13.3 % (ref 11.5–15.5)
WBC: 6 10*3/uL (ref 4.0–10.5)
nRBC: 0 % (ref 0.0–0.2)

## 2020-06-11 LAB — BASIC METABOLIC PANEL
Anion gap: 8 (ref 5–15)
BUN: 26 mg/dL — ABNORMAL HIGH (ref 8–23)
CO2: 24 mmol/L (ref 22–32)
Calcium: 8.7 mg/dL — ABNORMAL LOW (ref 8.9–10.3)
Chloride: 109 mmol/L (ref 98–111)
Creatinine, Ser: 1.28 mg/dL — ABNORMAL HIGH (ref 0.44–1.00)
GFR, Estimated: 40 mL/min — ABNORMAL LOW (ref >60)
Glucose, Bld: 172 mg/dL — ABNORMAL HIGH (ref 70–99)
Potassium: 4.5 mmol/L (ref 3.5–5.1)
Sodium: 141 mmol/L (ref 135–145)

## 2020-06-11 LAB — GLUCOSE, CAPILLARY
Glucose-Capillary: 125 mg/dL — ABNORMAL HIGH (ref 70–99)
Glucose-Capillary: 256 mg/dL — ABNORMAL HIGH (ref 70–99)

## 2020-06-11 LAB — URINE CULTURE: Culture: NO GROWTH

## 2020-06-11 LAB — PROCALCITONIN: Procalcitonin: 1.09 ng/mL

## 2020-06-11 LAB — STREP PNEUMONIAE URINARY ANTIGEN: Strep Pneumo Urinary Antigen: NEGATIVE

## 2020-06-11 MED ORDER — CEFDINIR 300 MG PO CAPS: 300.0000 mg | ORAL_CAPSULE | Freq: Two times a day (BID) | ORAL | 0 refills | Status: AC

## 2020-06-11 MED ORDER — GUAIFENESIN-DM 100-10 MG/5ML PO SYRP: 5.0000 mL | ORAL_SOLUTION | ORAL | 0 refills | Status: DC | PRN

## 2020-06-11 MED ORDER — CEFDINIR 300 MG PO CAPS
300.0000 mg | ORAL_CAPSULE | Freq: Every day | ORAL | Status: DC
  Administered 2020-06-11: 300 mg via ORAL
  Filled 2020-06-11: qty 1

## 2020-06-11 MED ORDER — PREDNISONE 10 MG PO TABS: ORAL_TABLET | ORAL | 0 refills | Status: DC

## 2020-06-11 MED ORDER — CEPHALEXIN 500 MG PO CAPS: 500.0000 mg | ORAL_CAPSULE | Freq: Three times a day (TID) | ORAL | Status: DC

## 2020-06-11 NOTE — Progress Notes (Signed)
PROGRESS NOTE  Renee [name]  DOB: 08-24-1928  PCP: Abner Greenspan, MD KGU:[id number]  DOA: 06/07/2020  LOS: 4 days   Chief Complaint  Patient presents with  . Shortness of Breath    Brief narrative: Patient is a 86 y.o. female with history of HTN, DM, HLD, CKD 3B, chronic left-sided pleural effusion, depression/anxiety, frequent UTIs (on antibiotics prior to this hospital stay). Patient presented to the ED on 10/19 with fever, shortness of breath and cough.  Symptoms started a week prior after getting Covid shot.  Initially dry cough and later became productive with yellowish sputum. Also endorsed 1 week history of dysuria. Urine culture on 10/11 as an outpatient was positive for E. coli pansensitive, and was started on Macrobid which she apparently started taking only on 10/18.  In the ED, patient had a temperature of 101.1, she was tachypneic, required 2 L oxygen nasal cannula. WC count 19.1, creatinine 1.4, glucose 282, lactic acid normal. Chest x-ray showed increasing left pleural effusion and lower field consolidation. Patient was admitted to hospital service for pneumonia.  Subjective: Seen and examined this morning.  Elderly Caucasian female.  Sitting up in chair.  On 2 L oxygen by nasal cannula.  Not in distress.  Assessment/Plan: Sepsis - POA Community-acquired pneumonia -Presented with fever, leukocytosis, elevated creatinine, chest x-ray finding with pneumonia -Sepsis parameters improving on IV Rocephin/azithromycin. -Okay to switch to oral Omnicef today. -Adequately hydrated. -Continue prednisone taper.  Acute on chronic left-sided pleural effusion -? Cause. Underwent 450 mL of thoracentesis by IR on 10/20. -Pulmonary consultation appreciated. Follow-up as an outpatient. -Echocardiogram showed EF of 65 to 70% with mild concentric LVH, grade 1 diastolic dysfunction.  Acute respiratory failure with hypoxia -Secondary to pneumonia and effusion. -On low-flow  oxygen by nasal cannula.  Currently on 2 L by nasal cannula. -Continue to wean down as tolerated.  CKD stage IIIb -Creatinine at baseline seems to be less than 1.5.  Creatinine currently remains within baseline. Recent Labs    10/31/19 0235 01/05/20 1107 02/10/20 1123 02/12/20 1331 02/13/20 0304 06/07/20 1145 06/08/20 0333 06/09/20 0849 06/10/20 0038 06/11/20 0123  BUN 19 25* 18 15 15 21  26* 23 27* 26*  CREATININE 1.39* 1.44* 1.42* 1.29* 1.18* 1.47* 1.50* 1.39* 1.49* 1.28*   Recurrent UTI -Recent urine culture from 10/11 with E. coli.  She started Macrobid on 10/18. -Currently on IV Rocephin.  Switched to [place] today. -reportedly this is patient's fourth UTI this year.    Essential hypertension -Home meds include amlodipine 10 mg daily, hydralazine 100 mg 3 times daily, metoprolol 12.5 mg twice daily, trandolapril 4 mg daily. -Continue the same.  DM 2 -A1c 6.19 Dec 2019 -Currently blood sugar level is running high mostly over 200 probably due to prednisone. -Continue glipizide.  Continue sliding-scale insulin with Accu-Cheks. -Prednisone dose being tapered down.Marland Kitchen Recent Labs  Lab 06/10/20 1154 06/10/20 1725 06/10/20 2100 06/11/20 0709 06/11/20 1137  GLUCAP 229* 285* 215* 125* 256*   Hyperlipidemia -Continue gemfibrozil  Anxiety/depression -Continue as needed Xanax, Remeron and SSRI  Lung nodules -recent CT scan of chest on 10/5 showed few small pulmonary nodules measuring up to 4 mm in the right lung, one of which was included in the field of view on prior abdominal CT and is unchanged. No follow-up needed if patient is low-risk (and has no known or suspected primary neoplasm). Non-contrast chest CT can be considered in 12 months if patient is high-risk.   Chronic anemia -Continue iron and vitamin  B12 supplement.  Hemoglobin stable.  Impaired mobility -Seen by physical therapy.  SNF versus home with 24-hour supervision recommended.  Patient already had 6  hours of home health aide at home.  Family was able to arrange 24-hour care.  Mobility: Encourage ambulation Code Status:   Code Status: DNR  Nutritional status: Body mass index is 31.09 kg/m.     Diet Order            Diet - low sodium heart healthy           Diet Carb Modified           Diet heart healthy/carb modified Room service appropriate? Yes; Fluid consistency: Thin  Diet effective now                 DVT prophylaxis: heparin injection 5,000 Units Start: 06/07/20 1400   Antimicrobials:  Oral Omnicef Fluid: None Consultants: Pulmonary Family Communication:  None at bedside.  Status is: Inpatient  Remains inpatient appropriate because -patient had 24 hour supervision.  Family arranging that.  Dispo: The patient is from: Home              Anticipated d/c is to: Home with 24-hour supervision              Anticipated d/c date is: Unable to discharge today because family will not have home health aide available till Monday.              Patient currently is not medically stable to d/c.   Infusions:  . azithromycin 250 mL/hr at 06/11/20 0900    Scheduled Meds: . albuterol  2.5 mg Nebulization Once  . amLODipine  10 mg Oral Daily  . aspirin  325 mg Oral Daily  . calcium-vitamin D  1 tablet Oral Daily  . cefdinir  300 mg Oral Daily  . cholecalciferol  1,000 Units Oral Daily  . docusate sodium  200 mg Oral QHS  . ferrous sulfate  325 mg Oral BID  . gemfibrozil  600 mg Oral Daily  . glipiZIDE  2.5 mg Oral Q breakfast  . heparin  5,000 Units Subcutaneous Q12H  . hydrALAZINE  100 mg Oral TID  . insulin aspart  0-9 Units Subcutaneous TID WC  . ipratropium-albuterol  3 mL Nebulization BID  . metoprolol tartrate  12.5 mg Oral BID  . mirtazapine  7.5 mg Oral QHS  . olopatadine  1 drop Both Eyes Daily  . predniSONE  30 mg Oral Q breakfast  . sertraline  50 mg Oral Daily  . trandolapril  4 mg Oral Daily  . triamcinolone  2 spray Nasal Daily  . vitamin B-12   1,000 mcg Oral Daily    Antimicrobials: Anti-infectives (From admission, onward)   Start     Dose/Rate Route Frequency Ordered Stop   06/11/20 1400  cephALEXin (KEFLEX) capsule 500 mg  Status:  Discontinued        500 mg Oral Every 8 hours 06/11/20 1133 06/11/20 1140   06/11/20 1245  cefdinir (OMNICEF) capsule 300 mg        300 mg Oral Daily 06/11/20 1140     06/11/20 0000  cefdinir (OMNICEF) 300 MG capsule        300 mg Oral Every 12 hours 06/11/20 1142 06/14/20 2359   06/08/20 1230  azithromycin (ZITHROMAX) 500 mg in sodium chloride 0.9 % 250 mL IVPB        500 mg 250 mL/hr over 60 Minutes Intravenous Every  24 hours 06/07/20 1352     06/08/20 1200  cefTRIAXone (ROCEPHIN) 2 g in sodium chloride 0.9 % 100 mL IVPB  Status:  Discontinued        2 g 200 mL/hr over 30 Minutes Intravenous Every 24 hours 06/07/20 1352 06/11/20 1133   06/07/20 1400  cefTRIAXone (ROCEPHIN) 2 g in sodium chloride 0.9 % 100 mL IVPB  Status:  Discontinued        2 g 200 mL/hr over 30 Minutes Intravenous Every 24 hours 06/07/20 1351 06/07/20 1352   06/07/20 1400  azithromycin (ZITHROMAX) 500 mg in sodium chloride 0.9 % 250 mL IVPB  Status:  Discontinued        500 mg 250 mL/hr over 60 Minutes Intravenous Every 24 hours 06/07/20 1351 06/07/20 1352   06/07/20 1045  cefTRIAXone (ROCEPHIN) 1 g in sodium chloride 0.9 % 100 mL IVPB        1 g 200 mL/hr over 30 Minutes Intravenous  Once 06/07/20 1040 06/07/20 1231   06/07/20 1045  azithromycin (ZITHROMAX) 500 mg in sodium chloride 0.9 % 250 mL IVPB        500 mg 250 mL/hr over 60 Minutes Intravenous  Once 06/07/20 1040 06/07/20 1329      PRN meds: acetaminophen **OR** acetaminophen, ALPRAZolam, guaiFENesin-dextromethorphan, ipratropium-albuterol, ondansetron **OR** ondansetron (ZOFRAN) IV   Objective: Vitals:   06/11/20 0810 06/11/20 1140  BP:  (!) 157/65  Pulse: 68 67  Resp: (!) 22 (!) 25  Temp:  98.4 F (36.9 C)  SpO2:  94%    Intake/Output Summary  (Last 24 hours) at 06/11/2020 1248 Last data filed at 06/11/2020 0900 Gross per 24 hour  Intake 654.99 ml  Output 500 ml  Net 154.99 ml   Filed Weights   06/07/20 2325  Weight: 77.1 kg   Weight change:  Body mass index is 31.09 kg/m.   Physical Exam: General exam: Appears calm and comfortable.  Not in physical distress Skin: No rashes, lesions or ulcers. HEENT: Atraumatic, normocephalic, supple neck, no obvious bleeding Lungs: Clear to auscultate bilaterally CVS: Regular rate and rhythm, normal GI/Abd soft, nontender, nondistended, soft. CNS: Alert, awake, oriented x3, hard of hearing Psychiatry: Depressed look Extremities: No pedal edema, no calf tenderness  Data Review: I have personally reviewed the laboratory data and studies available.  Recent Labs  Lab 06/07/20 1145 06/08/20 0333 06/09/20 0849 06/10/20 0038 06/11/20 0123  WBC 19.1* 9.1 7.1 7.1 6.0  NEUTROABS 18.3* 7.1 5.1 5.6 4.6  HGB 12.6 10.5* 10.9* 10.1* 10.0*  HCT 42.0 33.6* 36.1 32.6* 32.5*  MCV 91.1 90.6 92.3 90.3 91.0  PLT 179 159 148* 182 170   Recent Labs  Lab 06/07/20 1145 06/08/20 0333 06/09/20 0849 06/10/20 0038 06/11/20 0123  NA 139 140 141 141 141  K 4.1 4.0 3.8 3.9 4.5  CL 104 108 107 109 109  CO2 23 23 23 25 24   GLUCOSE 282* 129* 166* 112* 172*  BUN 21 26* 23 27* 26*  CREATININE 1.47* 1.50* 1.39* 1.49* 1.28*  CALCIUM 9.1 8.5* 8.8* 8.7* 8.7*  MG  --   --  1.7  --   --   PHOS  --   --  3.1  --   --     F/u labs ordered  Signed, Terrilee Croak, MD [facility] 06/11/2020

## 2020-06-11 NOTE — Progress Notes (Signed)
SATURATION QUALIFICATIONS: (This note is used to comply with regulatory documentation for home oxygen)  Patient Saturations on Room Air at Rest = 94%  Patient Saturations on Room Air while Ambulating =91%  Patient Saturations on 0 Liters of oxygen while Ambulating = 91%  Please briefly explain why patient needs home oxygen: Not needed.

## 2020-06-11 NOTE — Plan of Care (Signed)
  Problem: Education: Goal: Knowledge of General Education information will improve Description: Including pain rating scale, medication(s)/side effects and non-pharmacologic comfort measures Outcome: Not Progressing   Problem: Health Behavior/Discharge Planning: Goal: Ability to manage health-related needs will improve Outcome: Not Progressing   Problem: Clinical Measurements: Goal: Ability to maintain clinical measurements within normal limits will improve Outcome: Not Progressing Goal: Will remain free from infection Outcome: Not Progressing Goal: Diagnostic test results will improve Outcome: Not Progressing Goal: Respiratory complications will improve Outcome: Not Progressing Goal: Cardiovascular complication will be avoided Outcome: Not Progressing   Problem: Nutrition: Goal: Adequate nutrition will be maintained Outcome: Not Progressing   Problem: Activity: Goal: Risk for activity intolerance will decrease Outcome: Not Progressing   Problem: Coping: Goal: Level of anxiety will decrease Outcome: Not Progressing

## 2020-06-11 NOTE — Plan of Care (Signed)

## 2020-06-11 NOTE — Progress Notes (Signed)
Ambulated patient in room with moderate 2 person assistance and walker. Patient dropped to 87-89% at times but majority of time was between 91-94%.

## 2020-06-11 NOTE — Discharge Summary (Signed)
Physician Discharge Summary  Shauntel Prest Kirschenbaum YDX:[id number] DOB: 08-Apr-1929 DOA: 06/07/2020  PCP: Abner Greenspan, MD  Admit date: 06/07/2020 Discharge date: 06/11/2020  Admitted From: Home Discharge disposition: Home with home health PT, RN   Code Status: DNR  Diet Recommendation: Cardiac/diabetic diet  Discharge Diagnosis:   Principal Problem:   Sepsis ([place]) Active Problems:   Community acquired pneumonia of left lower lobe of lung   History of Present Illness / Brief narrative:  Patient is a89 y.o.femalewith history of HTN, DM, HLD, CKD 3B, chronic left-sided pleural effusion, depression/anxiety, frequent UTIs (on antibiotics prior to this hospital stay). Patient presented to the ED on 10/19 with fever, shortness of breath and cough.  Symptoms started a week prior after getting Covid shot.  Initially dry cough and later became productive with yellowish sputum. Also endorsed 1 week history of dysuria. Urine culture on 10/11 as an outpatient was positive for E. coli pansensitive, and was started on Macrobid which she apparently started taking only on 10/18.  In the ED, patient had a temperature of 101.1, she was tachypneic, required 2 L oxygen nasal cannula. WC count 19.1, creatinine 1.4, glucose 282, lactic acid normal. Chest x-ray showed increasing left pleural effusion and lower field consolidation. Patient was admitted to hospital service for pneumonia.  Subjective:  Seen and examined this morning.  Elderly Caucasian female.  Sitting up in chair.  On 2 L oxygen by nasal cannula.  Not in distress.  Hospital Course:  Sepsis - POA Community-acquired pneumonia -Presented with fever, leukocytosis, elevated creatinine, chest x-ray finding with pneumonia -Sepsis parameters improving on IV Rocephin/azithromycin. -Adequately hydrated. -Continue prednisone taper.  Acute on chronic left-sided pleural effusion -?Cause. Underwent 450 mL of thoracentesis by IR on  10/20. -Pulmonary consultation appreciated.  Follow-up as an outpatient. -Echocardiogram showed EF of 65 to 70% with mild concentric LVH, grade 1 diastolic dysfunction.  Acute respiratory failure with hypoxia -Secondary to pneumonia and effusion. -On low-flow oxygen by nasal cannula.  Initially required supplemental oxygen.  Able to ambulate on the hallway today without supplemental oxygen.  CKD stage IIIb -Creatinine at baseline seems to be less than 1.5.  Creatinine currently remains within baseline. Recent Labs    10/31/19 0235 01/05/20 1107 02/10/20 1123 02/12/20 1331 02/13/20 0304 06/07/20 1145 06/08/20 0333 06/09/20 0849 06/10/20 0038 06/11/20 0123  BUN 19 25* 18 15 15 21  26* 23 27* 26*  CREATININE 1.39* 1.44* 1.42* 1.29* 1.18* 1.47* 1.50* 1.39* 1.49* 1.28*   Recurrent UTI -Recent urine culture from 10/11 with E. coli.   -Currently on IV Rocephin. -reportedly this is patient's fourth UTI this year.   -Discharge home on 3 more days of oral Omnicef.  Essential hypertension -Home meds include amlodipine 10 mg daily, hydralazine 100 mg 3 times daily, metoprolol 12.5 mg twice daily, trandolapril 4 mg daily. -Continue same at home.  DM 2 -A1c 6.19 Dec 2019 -Currently blood sugar level is running high mostly over 200 probably due to prednisone. -Continue glipizide.  Continue sliding-scale insulin with Accu-Cheks. -Prednisone dose being tapered down.Marland Kitchen Recent Labs  Lab 06/10/20 1154 06/10/20 1725 06/10/20 2100 06/11/20 0709 06/11/20 1137  GLUCAP 229* 285* 215* 125* 256*   Hyperlipidemia -Continue gemfibrozil  Anxiety/depression -Continue as needed Xanax, Remeron and SSRI  Lung nodules -recent CT scan of chest on 10/5 showed few small pulmonary nodules measuring up to 4 mm in the right lung, one of which was included in the field of view on prior abdominal CT and is  unchanged. No follow-up needed if patient is low-risk (and has no known or suspected primary  neoplasm). Non-contrast chest CT can be considered in 12 months if patient is high-risk.   Chronic anemia -Continue iron and vitamin B12 supplement.  Hemoglobin stable.  Impaired mobility -Seen by physical therapy.  SNF versus home with 24-hour supervision recommended.  Patient already had 6 hours of home health aide at home.  Family was able to arrange 24-hour care.  Wound care: Wound / Incision (Open or Dehisced) 06/07/20 Skin tear Buttocks Mid pink tear (Active)  Date First Assessed/Time First Assessed: 06/07/20 2055   Wound Type: Skin tear  Location: Buttocks  Location Orientation: Mid  Wound Description (Comments): pink tear    Assessments 06/07/2020  8:55 PM 06/10/2020 10:55 PM  Dressing Type Moisture barrier Foam - Lift dressing to assess site every shift  Dressing Changed New --  Dressing Status -- Dry;Intact  Dressing Change Frequency PRN PRN  Site / Wound Assessment Pink --  Peri-wound Assessment Intact Intact  Drainage Amount -- None  Treatment Other (Comment) --     No Linked orders to display    Discharge Exam:   Vitals:   06/11/20 0355 06/11/20 0711 06/11/20 0810 06/11/20 1140  BP: (!) 150/60 (!) 173/64  (!) 157/65  Pulse: 63 63 68 67  Resp: 17 (!) 22 (!) 22 (!) 25  Temp: 97.8 F (36.6 C) (!) 97.5 F (36.4 C)  98.4 F (36.9 C)  TempSrc: Oral Oral  Oral  SpO2: 97% 98%  94%  Weight:      Height:        Body mass index is 31.09 kg/m.  General exam: Appears calm and comfortable.  Not in physical distress Skin: No rashes, lesions or ulcers. HEENT: Atraumatic, normocephalic, supple neck, no obvious bleeding Lungs: Clear to auscultation bilaterally CVS: Regular rate and rhythm, no murmur GI/Abd soft, nontender, nondistended, bowel sound present CNS: Alert, awake, oriented x3 Psychiatry: Mood appropriate Extremities: No pedal edema, no calf tenderness  Follow ups:   Discharge Instructions    Diet - low sodium heart healthy   Complete by: As directed     Diet Carb Modified   Complete by: As directed    Increase activity slowly   Complete by: As directed    Increase activity slowly   Complete by: As directed    Leave dressing on - Keep it clean, dry, and intact until clinic visit   Complete by: As directed    Leave dressing on - Keep it clean, dry, and intact until clinic visit   Complete by: As directed       Follow-up Information    Margaretha Seeds, MD Follow up.   Specialty: Pulmonary Disease Why: Please call for follow-up of chronic pleural effusion 2-4 weeks after discharge. Contact information: [address]  [place] [address] [phone]        Golda Acre, [facility] Follow up.   Specialty: Home Health Services Why: [facility] will contact your for your first home health appointment within 24-48 hours. Contact information: [place] [address] [phone]        Abner Greenspan, MD. Go on 06/15/2020.   Specialties: Family Medicine, Radiology Why: Please attend your hospital discharge appt on Wednesday, 06/15/20, at 11am with Dr Glori Bickers. Contact information: [address] [place] [address] [phone]  Recommendations for Outpatient Follow-Up:   1. Follow-up with PCP as an outpatient  Discharge Instructions:  Follow with Primary MD Tower, Wynelle Fanny, MD in 7 days   Get CBC/BMP checked in next visit within 1 week by PCP or SNF MD ( we routinely change or add medications that can affect your baseline labs and fluid status, therefore we recommend that you get the mentioned basic workup next visit with your PCP, your PCP may decide not to get them or add new tests based on their clinical decision)  On your next visit with your PCP, please Get Medicines reviewed and adjusted.  Please request your PCP  to go over all Hospital Tests and Procedure/Radiological results at the follow up, please get all Hospital records sent to your  Prim MD by signing hospital release before you go home.  Activity: As tolerated with Full fall precautions use walker/cane & assistance as needed  For Heart failure patients - Check your Weight same time everyday, if you gain over 2 pounds, or you develop in leg swelling, experience more shortness of breath or chest pain, call your Primary MD immediately. Follow Cardiac Low Salt Diet and 1.5 lit/day fluid restriction.  If you have smoked or chewed Tobacco in the last 2 yrs please stop smoking, stop any regular Alcohol  and or any Recreational drug use.  If you experience worsening of your admission symptoms, develop shortness of breath, life threatening emergency, suicidal or homicidal thoughts you must seek medical attention immediately by calling 911 or calling your MD immediately  if symptoms less severe.  You Must read complete instructions/literature along with all the possible adverse reactions/side effects for all the Medicines you take and that have been prescribed to you. Take any new Medicines after you have completely understood and accpet all the possible adverse reactions/side effects.   Do not drive, operate heavy machinery, perform activities at heights, swimming or participation in water activities or provide baby sitting services if your were admitted for syncope or siezures until you have seen by Primary MD or a Neurologist and advised to do so again.  Do not drive when taking Pain medications.  Do not take more than prescribed Pain, Sleep and Anxiety Medications  Wear Seat belts while driving.   Please note You were cared for by a hospitalist during your hospital stay. If you have any questions about your discharge medications or the care you received while you were in the hospital after you are discharged, you can call the unit and asked to speak with the hospitalist on call if the hospitalist that took care of you is not available. Once you are discharged, your primary care  physician will handle any further medical issues. Please note that NO REFILLS for any discharge medications will be authorized once you are discharged, as it is imperative that you return to your primary care physician (or establish a relationship with a primary care physician if you do not have one) for your aftercare needs so that they can reassess your need for medications and monitor your lab values.    Allergies as of 06/11/2020      Reactions   Diclofenac Sodium Other (See Comments)   REACTION: angioedema   Pioglitazone Swelling      Medication List    TAKE these medications   acetaminophen 325 MG tablet Commonly known as: TYLENOL Take 650 mg by mouth at bedtime.   albuterol 108 (90 Base) MCG/ACT inhaler Commonly known as: VENTOLIN HFA Inhale  2 puffs into the lungs every 4 (four) hours as needed for wheezing or shortness of breath.   ALPRAZolam 0.5 MG tablet Commonly known as: XANAX TAKE 1/2 TO 1 TABLET BY MOUTH TWICE A DAY AS NEEDED FOR ANXIETY What changed: See the new instructions.   amLODipine 10 MG tablet Commonly known as: NORVASC Take 1 tablet (10 mg total) by mouth daily.   aspirin 325 MG tablet Take 325 mg by mouth daily.   calcium-vitamin D 500-200 MG-UNIT tablet Commonly known as: OSCAL WITH D Take 1 tablet by mouth daily.   cefdinir 300 MG capsule Commonly known as: OMNICEF Take 1 capsule (300 mg total) by mouth every 12 (twelve) hours for 3 days.   Cranberry 500 MG Tabs Take 500 mg by mouth in the morning and at bedtime.   docusate sodium 100 MG capsule Commonly known as: COLACE Take 200 mg by mouth at bedtime.   ferrous sulfate 325 (65 FE) MG tablet Take 325 mg by mouth 2 (two) times daily.   gemfibrozil 600 MG tablet Commonly known as: LOPID TAKE 1 TABLET BY MOUTH EVERY DAY   glipiZIDE 2.5 MG 24 hr tablet Commonly known as: GLUCOTROL XL TAKE 1 TABLET BY MOUTH EVERY DAY WITH BREAKFAST What changed: See the new instructions.     guaiFENesin-dextromethorphan 100-10 MG/5ML syrup Commonly known as: ROBITUSSIN DM Take 5 mLs by mouth every 4 (four) hours as needed for cough.   hydrALAZINE 100 MG tablet Commonly known as: APRESOLINE Take 100 mg by mouth 3 (three) times daily.   metoprolol succinate 25 MG 24 hr tablet Commonly known as: TOPROL-XL Take 0.5 tablets (12.5 mg total) by mouth 2 (two) times daily.   mirtazapine 15 MG tablet Commonly known as: REMERON TAKE 1/2 TABLET BY MOUTH AT BEDTIME   multivitamin capsule Take 1 capsule by mouth daily.   nitrofurantoin (macrocrystal-monohydrate) 100 MG capsule Commonly known as: Macrobid Take 1 capsule (100 mg total) by mouth 2 (two) times daily.   ONE TOUCH ULTRA 2 w/Device Kit Check blood sugar once daily and as directed. Dx H70.2   OneTouch Delica Lancets 63Z Misc CHECK BLOOD SUGAR ONCE DAILY AND AS DIRECTED. DX E11.9 What changed: See the new instructions.   OneTouch Ultra test strip Generic drug: glucose blood CHECK BLOOD SUGAR ONCE DAILY AND AS DIRECTED. DX E11.9   PATADAY OP Place 1 drop into both eyes daily.   predniSONE 10 MG tablet Commonly known as: DELTASONE Take 3 tablets (30 mg) daily for 2 days, then, Take 2 tablets (20 mg) daily for 2 days, then, Take 1 tablets (10 mg) daily for 1 days, then stop.   PreserVision AREDS 2+Multi Vit Caps Take 1 capsule by mouth in the morning and at bedtime.   sertraline 50 MG tablet Commonly known as: ZOLOFT TAKE 1 TABLET BY MOUTH EVERY DAY   trandolapril 4 MG tablet Commonly known as: MAVIK Take 1 tablet (4 mg total) by mouth daily.   triamcinolone 55 MCG/ACT Aero nasal inhaler Commonly known as: NASACORT Place 2 sprays into the nose daily. What changed: when to take this   vitamin B-12 1000 MCG tablet Commonly known as: CYANOCOBALAMIN Take 1,000 mcg by mouth daily.   Vitamin D 1000 units capsule Take 1,000 Units by mouth daily.            Discharge Care Instructions  (From  admission, onward)         Start     Ordered   06/11/20 0000  Leave dressing on - Keep it clean, dry, and intact until clinic visit        06/11/20 1142   06/11/20 0000  Leave dressing on - Keep it clean, dry, and intact until clinic visit        06/11/20 1412          Time coordinating discharge: 35 minutes  The results of significant diagnostics from this hospitalization (including imaging, microbiology, ancillary and laboratory) are listed below for reference.    Procedures and Diagnostic Studies:   DG Chest 1 View  Result Date: 06/07/2020 CLINICAL DATA:  Status post left thoracentesis EXAM: CHEST  1 VIEW COMPARISON:  Same day chest radiograph FINDINGS: Stable cardiomegaly. Interval decrease of left-sided pleural effusion. Persistent opacity within the left lung base may reflect combination of atelectasis, consolidation, and trace residual fluid. No pneumothorax identified. Right lung is clear. IMPRESSION: Interval decrease of left-sided pleural effusion. No pneumothorax. Electronically Signed   By: Davina Poke D.O.   On: 06/07/2020 16:14   DG Chest 2 View  Result Date: 06/07/2020 CLINICAL DATA:  Shortness of breath. EXAM: CHEST - 2 VIEW COMPARISON:  05/13/2020 chest radiograph and prior. 05/24/2020 CT chest. FINDINGS: Left basilar consolidation and small to moderate left pleural effusion are more conspicuous than prior exam. Clear right lung. No pneumothorax. Partially obscured cardiomediastinal silhouette. Osseous structures are unchanged. IMPRESSION: Increased conspicuity of left basilar consolidation. Increased small to moderate left pleural effusion. Electronically Signed   By: Primitivo Gauze M.D.   On: 06/07/2020 10:12   IR THORACENTESIS ASP PLEURAL SPACE W/IMG GUIDE  Result Date: 06/07/2020 INDICATION: Patient with history of CKD admitted for sepsis, dyspnea, and found to have left pleural effusion. Request is made for diagnostic and therapeutic left  thoracentesis. EXAM: ULTRASOUND GUIDED DIAGNOSTIC AND THERAPEUTIC LEFT THORACENTESIS MEDICATIONS: 10 mL 1% lidocaine COMPLICATIONS: None immediate. PROCEDURE: An ultrasound guided thoracentesis was thoroughly discussed with the patient and questions answered. The benefits, risks, alternatives and complications were also discussed. The patient understands and wishes to proceed with the procedure. Written consent was obtained. Ultrasound was performed to localize and mark an adequate pocket of fluid in the left chest. The area was then prepped and draped in the normal sterile fashion. 1% Lidocaine was used for local anesthesia. Under ultrasound guidance a 6 Fr Safe-T-Centesis catheter was introduced. Thoracentesis was performed. The catheter was removed and a dressing applied. FINDINGS: A total of approximately 450 mL of hazy amber fluid was removed. Samples were sent to the laboratory as requested by the clinical team. IMPRESSION: Successful ultrasound guided left thoracentesis yielding 450 mL of pleural fluid. Read by: Earley Abide, PA-C Electronically Signed   By: Aletta Edouard M.D.   On: 06/07/2020 16:18     Labs:   Basic Metabolic Panel: Recent Labs  Lab 06/07/20 1145 06/07/20 1145 06/08/20 0333 06/08/20 0333 06/09/20 0849 06/09/20 0849 06/10/20 0038 06/11/20 0123  NA 139  --  140  --  141  --  141 141  K 4.1   < > 4.0   < > 3.8   < > 3.9 4.5  CL 104  --  108  --  107  --  109 109  CO2 23  --  23  --  23  --  25 24  GLUCOSE 282*  --  129*  --  166*  --  112* 172*  BUN 21  --  26*  --  23  --  27* 26*  CREATININE 1.47*  --  1.50*  --  1.39*  --  1.49* 1.28*  CALCIUM 9.1  --  8.5*  --  8.8*  --  8.7* 8.7*  MG  --   --   --   --  1.7  --   --   --   PHOS  --   --   --   --  3.1  --   --   --    < > = values in this interval not displayed.   GFR Estimated Creatinine Clearance: 28.6 mL/min (A) (by C-G formula based on SCr of 1.28 mg/dL (H)). Liver Function Tests: Recent Labs  Lab  06/07/20 1145  AST 19  ALT 18  ALKPHOS 76  BILITOT 0.9  PROT 5.8*  ALBUMIN 3.5   No results for input(s): LIPASE, AMYLASE in the last 168 hours. No results for input(s): AMMONIA in the last 168 hours. Coagulation profile Recent Labs  Lab 06/07/20 1735  INR 1.0    CBC: Recent Labs  Lab 06/07/20 1145 06/08/20 0333 06/09/20 0849 06/10/20 0038 06/11/20 0123  WBC 19.1* 9.1 7.1 7.1 6.0  NEUTROABS 18.3* 7.1 5.1 5.6 4.6  HGB 12.6 10.5* 10.9* 10.1* 10.0*  HCT 42.0 33.6* 36.1 32.6* 32.5*  MCV 91.1 90.6 92.3 90.3 91.0  PLT 179 159 148* 182 170   Cardiac Enzymes: No results for input(s): CKTOTAL, CKMB, CKMBINDEX, TROPONINI in the last 168 hours. BNP: Invalid input(s): POCBNP CBG: Recent Labs  Lab 06/10/20 1154 06/10/20 1725 06/10/20 2100 06/11/20 0709 06/11/20 1137  GLUCAP 229* 285* 215* 125* 256*   D-Dimer No results for input(s): DDIMER in the last 72 hours. Hgb A1c No results for input(s): HGBA1C in the last 72 hours. Lipid Profile No results for input(s): CHOL, HDL, LDLCALC, TRIG, CHOLHDL, LDLDIRECT in the last 72 hours. Thyroid function studies No results for input(s): TSH, T4TOTAL, T3FREE, THYROIDAB in the last 72 hours.  Invalid input(s): FREET3 Anemia work up No results for input(s): VITAMINB12, FOLATE, FERRITIN, TIBC, IRON, RETICCTPCT in the last 72 hours. Microbiology Recent Results (from the past 240 hour(s))  Blood culture (routine x 2)     Status: None (Preliminary result)   Collection Time: 06/07/20 11:40 AM   Specimen: BLOOD RIGHT HAND  Result Value Ref Range Status   Specimen Description BLOOD RIGHT HAND  Final   Special Requests   Final    BOTTLES DRAWN AEROBIC AND ANAEROBIC Blood Culture results may not be optimal due to an inadequate volume of blood received in culture bottles   Culture   Final    NO GROWTH 4 DAYS Performed at [facility], [place] [address] [place], [place] [address]    Report Status PENDING  Incomplete  Respiratory  Panel by RT PCR (Flu A&B, Covid) - Nasopharyngeal Swab     Status: None   Collection Time: 06/07/20 12:09 PM   Specimen: Nasopharyngeal Swab  Result Value Ref Range Status   SARS Coronavirus 2 by RT PCR NEGATIVE NEGATIVE Final    Comment: (NOTE) SARS-CoV-2 target nucleic acids are NOT DETECTED.  The SARS-CoV-2 RNA is generally detectable in upper respiratoy specimens during the acute phase of infection. The lowest concentration of SARS-CoV-2 viral copies this assay can detect is 131 copies/mL. A negative result does not preclude SARS-Cov-2 infection and should not be used as the sole basis for treatment or other patient management decisions. A negative result may occur with  improper specimen collection/handling, submission of specimen other than nasopharyngeal swab, presence of  viral mutation(s) within the areas targeted by this assay, and inadequate number of viral copies (<131 copies/mL). A negative result must be combined with clinical observations, patient history, and epidemiological information. The expected result is Negative.  Fact Sheet for Patients:  PinkCheek.be  Fact Sheet for Healthcare Providers:  GravelBags.it  This test is no t yet approved or cleared by the [place] FDA and  has been authorized for detection and/or diagnosis of SARS-CoV-2 by FDA under an Emergency Use Authorization (EUA). This EUA will remain  in effect (meaning this test can be used) for the duration of the COVID-19 declaration under Section 564(b)(1) of the Act, 21 U.S.C. section 360bbb-3(b)(1), unless the authorization is terminated or revoked sooner.     Influenza A by PCR NEGATIVE NEGATIVE Final   Influenza B by PCR NEGATIVE NEGATIVE Final    Comment: (NOTE) The Xpert Xpress SARS-CoV-2/FLU/RSV assay is intended as an aid in  the diagnosis of influenza from Nasopharyngeal swab specimens and  should not be used as a sole basis  for treatment. Nasal washings and  aspirates are unacceptable for Xpert Xpress SARS-CoV-2/FLU/RSV  testing.  Fact Sheet for Patients: PinkCheek.be  Fact Sheet for Healthcare Providers: GravelBags.it  This test is not yet approved or cleared by the [place] FDA and  has been authorized for detection and/or diagnosis of SARS-CoV-2 by  FDA under an Emergency Use Authorization (EUA). This EUA will remain  in effect (meaning this test can be used) for the duration of the  Covid-19 declaration under Section 564(b)(1) of the Act, 21  U.S.C. section 360bbb-3(b)(1), unless the authorization is  terminated or revoked. Performed at [facility], [place] [address] [place], [place] [address]   Gram stain     Status: None   Collection Time: 06/07/20  3:56 PM   Specimen: Lung, Left; Pleural Fluid  Result Value Ref Range Status   Specimen Description FLUID LEFT PLEURAL  Final   Special Requests NONE  Final   Gram Stain   Final    MODERATE WBC PRESENT,BOTH PMN AND MONONUCLEAR NO ORGANISMS SEEN Performed at [facility], [place] [address] [place], [place] [address]    Report Status 06/07/2020 FINAL  Final  Culture, body fluid-bottle     Status: None (Preliminary result)   Collection Time: 06/07/20  3:56 PM   Specimen: Fluid  Result Value Ref Range Status   Specimen Description FLUID LEFT PLEURAL  Final   Special Requests BOTTLES DRAWN AEROBIC AND ANAEROBIC 10CC  Final   Culture   Final    NO GROWTH 4 DAYS Performed at [facility], [place] [address] [place], [place] [address]    Report Status PENDING  Incomplete  MRSA PCR Screening     Status: None   Collection Time: 06/07/20  9:34 PM   Specimen: Nasopharyngeal  Result Value Ref Range Status   MRSA by PCR NEGATIVE NEGATIVE Final    Comment:        The GeneXpert MRSA Assay (FDA approved for NASAL specimens only), is one component of a comprehensive MRSA  colonization surveillance program. It is not intended to diagnose MRSA infection nor to guide or monitor treatment for MRSA infections. Performed at [facility], [place] [address] [place], [place] [address]   Expectorated sputum assessment w rflx to resp cult     Status: None   Collection Time: 06/08/20  1:04 AM   Specimen: Expectorated Sputum  Result Value Ref Range Status  Specimen Description Expect. Sput  Final   Special Requests NONE  Final   Sputum evaluation   Final    THIS SPECIMEN IS ACCEPTABLE FOR SPUTUM CULTURE Performed at [facility], [address], [place], [place] [address]    Report Status 06/08/2020 FINAL  Final  Culture, respiratory     Status: None   Collection Time: 06/08/20  1:04 AM  Result Value Ref Range Status   Specimen Description Expect. Sput  Final   Special Requests NONE Reflexed from [id number]  Final   Gram Stain   Final    FEW WBC PRESENT, PREDOMINANTLY PMN FEW GRAM NEGATIVE RODS RARE GRAM POSITIVE COCCI IN CLUSTERS    Culture   Final    RARE Normal respiratory flora-no Staph aureus or Pseudomonas seen Performed at [facility], [address] [place], [place] [address]    Report Status 06/10/2020 FINAL  Final  Urine culture     Status: None   Collection Time: 06/08/20 11:09 PM   Specimen: Urine, Random  Result Value Ref Range Status   Specimen Description URINE, RANDOM  Final   Special Requests NONE  Final   Culture   Final    NO GROWTH Performed at [facility], [place] [address] [place],  [address]    Report Status 06/11/2020 FINAL  Final     Signed: Marlowe Aschoff Alisandra Son  [facility] 06/11/2020, 2:12 PM

## 2020-06-11 NOTE — TOC Transition Note (Signed)
Transition of Care [facility]) - CM/SW Discharge Note   Patient Details  Name: Renee [name] MRN: [id number] Date of Birth: Nov 14, 1928  Transition of Care [facility]) CM/SW Contact:  Carles Collet, RN Phone Number: 06/11/2020, 1:38 PM   Clinical Narrative:   Damaris Schooner w patient and sister [name] at bedside. Answered questions about oxygen for DC (patient does not need). Discussed support for over the weekend at home. [name] states that her normal aid who is usually there today (off tomorrow) is not available. Her plan was to stay with the patient at her house over the weekend. [name] identified her children as resources for support if she needed additional help.  We discussed transportation, and for patient safety agreed upon PTAR for transport home. [name] asked the patient and they agreed that they were both comfortable with DC today.   Will place PTAR forms on chart, and arrange for PTAR pickup.     Final next level of care: [place] Barriers to Discharge: No Barriers Identified   Patient Goals and CMS Choice Patient states their goals for this hospitalization and ongoing recovery are:: use my walker to get around, independence CMS Medicare.gov Compare Post Acute Care list provided to:: Patient Choice offered to / list presented to : Patient  Discharge Placement                       Discharge Plan and Services     Post Acute Care Choice: [place] Agency: [facility] Date [place]: 06/11/20 Time [place]: [id number] Representative spoke with at [place]: [place] ([place]) Interventions     Readmission Risk Interventions Readmission Risk Prevention Plan 06/10/2020  PCP or Specialist appointment within 3-5 days of discharge Complete  Some recent data might be hidden

## 2020-06-11 NOTE — Progress Notes (Signed)
IV removed, tele removed, patient changed into personal clothing. Discharge documents reviewed with patient and sister/caregiver. No further questions at this time. Patient to be picked up by PTAR.

## 2020-06-12 LAB — CULTURE, BLOOD (ROUTINE X 2): Culture: NO GROWTH

## 2020-06-12 LAB — CULTURE, BODY FLUID W GRAM STAIN -BOTTLE: Culture: NO GROWTH

## 2020-06-13 ENCOUNTER — Telehealth: Payer: Self-pay

## 2020-06-13 NOTE — Telephone Encounter (Addendum)
Transition Care Management Unsuccessful Follow-up Telephone Call  Date of discharge and from where:  [facility]  Attempts:  1st Attempt  Reason for unsuccessful TCM follow-up call:  Left voice message

## 2020-06-14 DIAGNOSIS — D631 Anemia in chronic kidney disease: Secondary | ICD-10-CM | POA: Diagnosis not present

## 2020-06-14 DIAGNOSIS — J189 Pneumonia, unspecified organism: Secondary | ICD-10-CM | POA: Diagnosis not present

## 2020-06-14 DIAGNOSIS — E785 Hyperlipidemia, unspecified: Secondary | ICD-10-CM | POA: Diagnosis not present

## 2020-06-14 DIAGNOSIS — E1165 Type 2 diabetes mellitus with hyperglycemia: Secondary | ICD-10-CM | POA: Diagnosis not present

## 2020-06-14 DIAGNOSIS — R69 Illness, unspecified: Secondary | ICD-10-CM | POA: Diagnosis not present

## 2020-06-14 DIAGNOSIS — E1122 Type 2 diabetes mellitus with diabetic chronic kidney disease: Secondary | ICD-10-CM | POA: Diagnosis not present

## 2020-06-14 DIAGNOSIS — J90 Pleural effusion, not elsewhere classified: Secondary | ICD-10-CM | POA: Diagnosis not present

## 2020-06-14 DIAGNOSIS — N1832 Chronic kidney disease, stage 3b: Secondary | ICD-10-CM | POA: Diagnosis not present

## 2020-06-14 DIAGNOSIS — N390 Urinary tract infection, site not specified: Secondary | ICD-10-CM | POA: Diagnosis not present

## 2020-06-14 DIAGNOSIS — I69354 Hemiplegia and hemiparesis following cerebral infarction affecting left non-dominant side: Secondary | ICD-10-CM | POA: Diagnosis not present

## 2020-06-14 DIAGNOSIS — I1310 Hypertensive heart and chronic kidney disease without heart failure, with stage 1 through stage 4 chronic kidney disease, or unspecified chronic kidney disease: Secondary | ICD-10-CM | POA: Diagnosis not present

## 2020-06-14 DIAGNOSIS — A4151 Sepsis due to Escherichia coli [E. coli]: Secondary | ICD-10-CM | POA: Diagnosis not present

## 2020-06-14 NOTE — Telephone Encounter (Signed)
Transition Care Management Unsuccessful Follow-up Telephone Call  Date of discharge and from where:  [facility]  Attempts:  2nd Attempt  Reason for unsuccessful TCM follow-up call:  Left voice message

## 2020-06-14 NOTE — Telephone Encounter (Signed)
Transition Care Management Follow-up Telephone Call  Date of discharge and from where: [facility]  How have you been since you were released from the hospital? Patient is doing okay. Still complains of coughing and fatigue. No fever, shortness of breath or wheezing noted.  Any questions or concerns? No  Items Reviewed:  Did the pt receive and understand the discharge instructions provided? Yes   Medications obtained and verified? Yes   Other? No   Any new allergies since your discharge? No   Dietary orders reviewed? Yes  Do you have support at home? Yes   Home Care and Equipment/Supplies: Were home health services ordered? yes If so, what is the name of the agency? [facility]  Has the agency set up a time to come to the patient's home? no Were any new equipment or medical supplies ordered?  No What is the name of the medical supply agency? N/A Were you able to get the supplies/equipment? no Do you have any questions related to the use of the equipment or supplies? No  Functional Questionnaire: (I = Independent and D = Dependent) ADLs: requires assistance  Bathing/Dressing- requires assistance  Meal Prep- requires assistance  Eating- I  Maintaining continence- I  Transferring/Ambulation- requires assistance  Managing Meds- requires assistance  Follow up appointments reviewed:   PCP Hospital f/u appt confirmed? Yes  Scheduled to see Dr. Glori Bickers on 06/15/2020 @ 11 am. Patient is to fatigue to come in the office. Would like to complete this visit virtually.   [facility] f/u appt confirmed? Follow up with pulmonary  Are transportation arrangements needed? No   If their condition worsens, is the pt aware to call PCP or go to the Emergency Dept.? Yes  Was the patient provided with contact information for the PCP's office or ED? Yes  Was to pt encouraged to call back with questions or concerns? Yes

## 2020-06-15 ENCOUNTER — Telehealth (INDEPENDENT_AMBULATORY_CARE_PROVIDER_SITE_OTHER): Payer: Medicare HMO | Admitting: Family Medicine

## 2020-06-15 ENCOUNTER — Encounter: Payer: Self-pay | Admitting: Family Medicine

## 2020-06-15 VITALS — BP 179/79 | HR 58

## 2020-06-15 DIAGNOSIS — Z8619 Personal history of other infectious and parasitic diseases: Secondary | ICD-10-CM

## 2020-06-15 DIAGNOSIS — I10 Essential (primary) hypertension: Secondary | ICD-10-CM

## 2020-06-15 DIAGNOSIS — N184 Chronic kidney disease, stage 4 (severe): Secondary | ICD-10-CM

## 2020-06-15 DIAGNOSIS — N390 Urinary tract infection, site not specified: Secondary | ICD-10-CM | POA: Diagnosis not present

## 2020-06-15 DIAGNOSIS — J90 Pleural effusion, not elsewhere classified: Secondary | ICD-10-CM

## 2020-06-15 DIAGNOSIS — E11319 Type 2 diabetes mellitus with unspecified diabetic retinopathy without macular edema: Secondary | ICD-10-CM | POA: Diagnosis not present

## 2020-06-15 DIAGNOSIS — J189 Pneumonia, unspecified organism: Secondary | ICD-10-CM | POA: Diagnosis not present

## 2020-06-15 NOTE — Assessment & Plan Note (Signed)
With CAP and uti  Reviewed hospital records, lab results and studies in detail   Clinically improved with abx incl rocephin/azithro and now macrobid

## 2020-06-15 NOTE — Assessment & Plan Note (Signed)
Fairly stable during recent hosp Reviewed hospital records, lab results and studies in detail   Was tx for e coli uti with macrobid

## 2020-06-15 NOTE — Assessment & Plan Note (Signed)
bp is up at home today in setting of recent CAP/hypoxia  Continues amlodipine 10 mg  Daily, hydralazine 100 mg tid and metoprolol 12.5 mg bid , trandolapril 4 mg daily  Home health will continue to monitor

## 2020-06-15 NOTE — Patient Instructions (Signed)
We will work on pulmonary follow up appt and call you  If symptoms suddenly worsen (short of breath) call and go to ER if severe  No change in medicines for now  Glucose will remain up until done with prednisone

## 2020-06-15 NOTE — Assessment & Plan Note (Addendum)
With recent hosp (this and CAP with sepsis)  Underwent 450 mL thoracentesis with improvement in hypoxia and sob (neg cx)  2 d echo showed 65-70% EF and mild concentric LVH /grade 1 DD She has pulm appt 12/1 with Dr Galen Daft  Home care reports gradually worsening sob and 02 sats sometimes going into high 80s  Will work on earlier pulmonary appt (she saw Dr Rodman Pickle in the hospital)

## 2020-06-15 NOTE — Progress Notes (Signed)
Virtual Visit via Telephone Note  I connected with Renee [name] on 06/15/20 at 11:00 AM EDT by telephone and verified that I am speaking with the correct person using two identifiers.  Location: Patient: home Provider: office   I discussed the limitations, risks, security and privacy concerns of performing an evaluation and management service by telephone and the availability of in person appointments. I also discussed with the patient that there [name] be a patient responsible charge related to this service. The patient expressed understanding and agreed to proceed.  Parties involved in encounter  Patient: Renee [name]  Provider:  [name] MD    History of Present Illness: Pt presents for f/u of hospitalization from 10/19 to 10/23 for CAP with sepsis (also had uti)   Hospital course   Hospital Course:  Sepsis - POA Community-acquired pneumonia -Presented with fever, leukocytosis, elevated creatinine, chest x-ray finding with pneumonia -Sepsis parameters improving on IV Rocephin/azithromycin. -Adequately hydrated. -Continue prednisone taper.  Still coughing a lot  Trying to sleep propped up  Taking robitussin cough medicine and inhaler every 4 hours  Is completing taper of prednisone Some wheezing     Acute on chronic left-sided pleural effusion -?Cause. Underwent 450 mL of thoracentesis by IR on 10/20. -Pulmonary consultation appreciated. Follow-up as an outpatient. -Echocardiogram showed EF of 65 to 70% with mild concentric LVH, grade 1 diastolic dysfunction.  She said she did get some relief with the tap  She has pulm appt -appt 12/1 Dr Patsey Berthold (she saw Dr Rodman Pickle in the hospital)   Acute respiratory failure with hypoxia -Secondary to pneumonia and effusion. -On low-flow oxygen by nasal cannula.  Initially required supplemental oxygen.  Able to ambulate on the hallway today without supplemental oxygen.  CKD stage IIIb -Creatinine  at baseline seems to be less than 1.5. Creatinine currently remains within baseline. Recent Labs (within last 365 days)              Recent Labs    10/31/19 0235 01/05/20 1107 02/10/20 1123 02/12/20 1331 02/13/20 0304 06/07/20 1145 06/08/20 0333 06/09/20 0849 06/10/20 0038 06/11/20 0123  BUN 19 25* _0 26* 23 27* 26*  CREATININE 1.39* 1.44* 1.42* 1.29* 1.18* 1.47* 1.50* 1.39* 1.49* 1.28*     Recurrent UTI -Recent urine culture from 10/11 with E. coli.  -Currently on IV Rocephin. -reportedly this is patient's fourth UTI this year.  -Discharge home on 3 more days of oral Omnicef.  Essential hypertension -Home meds include amlodipine 10 mg daily, hydralazine 100 mg 3 times daily, metoprolol 12.5 mg twice daily, trandolapril 4 mg daily. -Continue same at home.  DM 2 -A1c 6.1 [name] 2021 -Currently blood sugar level is running high mostly over 200 probably due to prednisone. -Continueglipizide. Continue sliding-scale insulin with Accu-Cheks. -Prednisone dose being tapered down.. Last Labs          Recent Labs  Lab 06/10/20 1154 06/10/20 1725 06/10/20 2100 06/11/20 0709 06/11/20 1137  GLUCAP 229* 285* 215* 125* 256*     Glucose has been over 300 (still on prednisone)  Appetite is ok   Hyperlipidemia -Continuegemfibrozil  Anxiety/depression -Continueas needed Xanax, Remeron and SSRI  Lung nodules -recent CT scan of chest on 10/5 showed few small pulmonary nodules measuring up to 4 mm in the right lung, one of which was included in the field of view on prior abdominal CT and is unchanged. No follow-up needed if patient is low-risk (and has no known  or suspected primary neoplasm). Non-contrast chest CT can be considered in 12 months if patient is high-risk.   Chronic anemia -Continue iron and vitamin B12 supplement. Hemoglobin stable.  Impaired mobility -Seen by physical therapy.  SNF versus home with 24-hour supervision recommended.  Patient  already had 6 hours of home health aide at home.  Family was able to arrange 24-hour care.  Glucose is elevated due to prednisone   xrays DG Chest 1 View  Result Date: 06/07/2020 CLINICAL DATA:  Status post left thoracentesis EXAM: CHEST  1 VIEW COMPARISON:  Same day chest radiograph FINDINGS: Stable cardiomegaly. Interval decrease of left-sided pleural effusion. Persistent opacity within the left lung base [name] reflect combination of atelectasis, consolidation, and trace residual fluid. No pneumothorax identified. Right lung is clear. IMPRESSION: Interval decrease of left-sided pleural effusion. No pneumothorax. Electronically Signed   By: Davina Poke D.O.   On: 06/07/2020 16:14   DG Chest 2 View  Result Date: 06/07/2020 CLINICAL DATA:  Shortness of breath. EXAM: CHEST - 2 VIEW COMPARISON:  05/13/2020 chest radiograph and prior. 05/24/2020 CT chest. FINDINGS: Left basilar consolidation and small to moderate left pleural effusion are more conspicuous than prior exam. Clear right lung. No pneumothorax. Partially obscured cardiomediastinal silhouette. Osseous structures are unchanged. IMPRESSION: Increased conspicuity of left basilar consolidation. Increased small to moderate left pleural effusion. Electronically Signed   By: Primitivo Gauze M.D.   On: 06/07/2020 10:12  Lab Results  Component Value Date   CREATININE 1.28 (H) 06/11/2020   BUN 26 (H) 06/11/2020   NA 141 06/11/2020   K 4.5 06/11/2020   CL 109 06/11/2020   CO2 24 06/11/2020   Lab Results  Component Value Date   ALT 18 06/07/2020   AST 19 06/07/2020   ALKPHOS 76 06/07/2020   BILITOT 0.9 06/07/2020   Lab Results  Component Value Date   WBC 6.0 06/11/2020   HGB 10.0 (L) 06/11/2020   HCT 32.5 (L) 06/11/2020   MCV 91.0 06/11/2020   PLT 170 06/11/2020   Per pt-coughing a lot still  A little phlegm - (not a lot)- white in color  Not sob, just coughing   Has home health  Sitting up in chair more now  Per  caregiver- pulse ox occ goes into the high 80s (movement)-suspects her effusion [name] be getting bigger and a fair amt of wheezing    Patient Active Problem List   Diagnosis Date Noted  . Pleural effusion 05/13/2020  . Frequent UTI 05/13/2020  . Dizziness 02/12/2020  . Normocytic anemia 02/12/2020  . Fall involving sidewalk curb 11/29/2019  . Contusion of back 11/27/2019  . Hyperlipidemia associated with type 2 diabetes mellitus ([place])   . Aortic atherosclerosis ([place]) 07/13/2019  . H/O sepsis 07/01/2019  . CKD (chronic kidney disease) stage 4, GFR 15-29 ml/min (HCC) 07/01/2019  . Lower abdominal pain 05/11/2019  . Routine general medical examination at a health care facility 03/30/2019  . External hemorrhoid 01/17/2018  . History of colitis 01/17/2018  . Generalized weakness 12/24/2016  . Diabetic retinopathy ([place]) 12/11/2016  . History of CVA (cerebrovascular accident) 12/11/2016  . Community acquired pneumonia of left lower lobe of lung 10/07/2015  . Estrogen deficiency 08/30/2015  . Encounter for Medicare annual wellness exam 04/17/2013  . Chest wall pain 04/07/2013  . Mobility impaired 06/26/2011  . History of retinal detachment 01/10/2011  . Sleep apnea 11/28/2010  . Anxiety and depression 08/25/2010  . Hemiplegia, late effect of cerebrovascular disease ([place]) 07/05/2010  .  POSTHERPETIC NEURALGIA 11/09/2009  . Renal insufficiency 06/29/2008  . Chronic back pain 01/26/2008  . EDEMA 01/26/2008  . B12 deficiency 01/10/2007  . Type 2 diabetes, controlled, with retinopathy ([place]) 11/27/2006  . Essential hypertension 11/27/2006  . FIBROCYSTIC BREAST DISEASE 11/27/2006  . ROSACEA 11/27/2006  . OSTEOARTHRITIS 11/27/2006  . URINARY INCONTINENCE, MIXED 11/27/2006   Past Medical History:  Diagnosis Date  . Angioedema    possibly from voltaren  . Bronchopneumonia 12/11/2016  . Degenerative disc disease   . Diabetes mellitus    type II  . Hyperlipidemia   . Hypertension   .  LVH (left ventricular hypertrophy)    and atrial enlargement by echo in past with nl EF  . Nasal pruritis   . Osteoarthritis   . Osteopenia   . Renal insufficiency   . Sleep apnea   . Stroke [facility]) 05/2010   Small vessel sobcortical (in [place] trial) with Dr Leonie Man, residual L hemiparesis  . Vitamin B 12 deficiency 04/08   Past Surgical History:  Procedure Laterality Date  . ABDOMINAL HYSTERECTOMY     BSO-fibroids  . APPENDECTOMY    . BACK SURGERY    . COLON SURGERY     due to punctured intestines  . EYE SURGERY     cataract extraction  . IR THORACENTESIS ASP PLEURAL SPACE W/IMG GUIDE  06/07/2020  . KNEE SURGERY     arthroscope  . PARS PLANA VITRECTOMY  07/31/2011   Procedure: PARS PLANA VITRECTOMY WITH 25 GAUGE;  Surgeon: [name], MD;  Location: [place];  Service: Ophthalmology;  Laterality: Right;  REMOVAL OF SILICONE OIL AND LASER RIGHT EYE  . RETINAL DETACHMENT SURGERY  02/18/11   times 2  . SPINE SURGERY  08/09   spinal decompression surgery   Social History   Tobacco Use  . Smoking status: Never Smoker  . Smokeless tobacco: Never Used  Vaping Use  . Vaping Use: Never assessed  Substance Use Topics  . Alcohol use: No    Alcohol/week: 0.0 standard drinks  . Drug use: No   Family History  Problem Relation Age of Onset  . COPD Brother   . Cancer Sister        brain tumor with hemmorhage  . Heart disease Sister        CAD   Allergies  Allergen Reactions  . Diclofenac Sodium Other (See Comments)    REACTION: angioedema  . Pioglitazone Swelling   Current Outpatient Medications on File Prior to Visit  Medication Sig Dispense Refill  . acetaminophen (TYLENOL) 325 MG tablet Take 650 mg by mouth at bedtime.     Marland Kitchen albuterol (VENTOLIN HFA) 108 (90 Base) MCG/ACT inhaler Inhale 2 puffs into the lungs every 4 (four) hours as needed for wheezing or shortness of breath. 18 g 3  . ALPRAZolam (XANAX) 0.5 MG tablet TAKE 1/2 TO 1 TABLET BY MOUTH TWICE A DAY AS NEEDED FOR  ANXIETY (Patient taking differently: Take 0.25-0.5 mg by mouth 2 (two) times daily as needed for anxiety. TAKE 1/2 TO 1 TABLET BY MOUTH TWICE A DAY AS NEEDED FOR ANXIETY) 60 tablet 2  . amLODipine (NORVASC) 10 MG tablet Take 1 tablet (10 mg total) by mouth daily. 90 tablet 3  . aspirin 325 MG tablet Take 325 mg by mouth daily.     . Blood Glucose Monitoring Suppl (ONE TOUCH ULTRA 2) w/Device KIT Check blood sugar once daily and as directed. Dx E11.9 1 each 0  . calcium-vitamin  D (OSCAL WITH D) 500-200 MG-UNIT per tablet Take 1 tablet by mouth daily.     . Cholecalciferol (VITAMIN D) 1000 UNITS capsule Take 1,000 Units by mouth daily.     . Cranberry 500 MG TABS Take 500 mg by mouth in the morning and at bedtime.    . docusate sodium (COLACE) 100 MG capsule Take 200 mg by mouth at bedtime.    . ferrous sulfate 325 (65 FE) MG tablet Take 325 mg by mouth 2 (two) times daily.    Marland Kitchen gemfibrozil (LOPID) 600 MG tablet TAKE 1 TABLET BY MOUTH EVERY DAY (Patient taking differently: Take 600 mg by mouth daily. ) 90 tablet 2  . glipiZIDE (GLUCOTROL XL) 2.5 MG 24 hr tablet TAKE 1 TABLET BY MOUTH EVERY DAY WITH BREAKFAST (Patient taking differently: Take 2.5 mg by mouth daily with breakfast. ) 90 tablet 3  . guaiFENesin-dextromethorphan (ROBITUSSIN DM) 100-10 MG/5ML syrup Take 5 mLs by mouth every 4 (four) hours as needed for cough. 118 mL 0  . hydrALAZINE (APRESOLINE) 100 MG tablet Take 100 mg by mouth 3 (three) times daily.     . metoprolol succinate (TOPROL-XL) 25 MG 24 hr tablet Take 0.5 tablets (12.5 mg total) by mouth 2 (two) times daily. 90 tablet 3  . mirtazapine (REMERON) 15 MG tablet TAKE 1/2 TABLET BY MOUTH AT BEDTIME (Patient taking differently: Take 7.5 mg by mouth at bedtime. ) 45 tablet 3  . Multiple Vitamin (MULTIVITAMIN) capsule Take 1 capsule by mouth daily.     . Multiple Vitamins-Minerals (PRESERVISION AREDS 2+MULTI VIT) CAPS Take 1 capsule by mouth in the morning and at bedtime.    .  nitrofurantoin, macrocrystal-monohydrate, (MACROBID) 100 MG capsule Take 1 capsule (100 mg total) by mouth 2 (two) times daily. 14 capsule 0  . Olopatadine HCl (PATADAY OP) Place 1 drop into both eyes daily.    Glory Rosebush Delica Lancets 38V MISC CHECK BLOOD SUGAR ONCE DAILY AND AS DIRECTED. DX E11.9 (Patient taking differently: 1 Device by Other route daily. ) 100 each 2  . ONETOUCH ULTRA test strip CHECK BLOOD SUGAR ONCE DAILY AND AS DIRECTED. DX E11.9 100 strip 1  . predniSONE (DELTASONE) 10 MG tablet Take 3 tablets (30 mg) daily for 2 days, then, Take 2 tablets (20 mg) daily for 2 days, then, Take 1 tablets (10 mg) daily for 1 days, then stop. 12 tablet 0  . sertraline (ZOLOFT) 50 MG tablet TAKE 1 TABLET BY MOUTH EVERY DAY (Patient taking differently: Take 50 mg by mouth daily. ) 90 tablet 2  . trandolapril (MAVIK) 4 MG tablet Take 1 tablet (4 mg total) by mouth daily. 90 tablet 3  . triamcinolone (NASACORT) 55 MCG/ACT AERO nasal inhaler Place 2 sprays into the nose daily. (Patient taking differently: Place 2 sprays into the nose at bedtime. ) 1 each 5  . vitamin B-12 (CYANOCOBALAMIN) 1000 MCG tablet Take 1,000 mcg by mouth daily.      No current facility-administered medications on file prior to visit.    Review of Systems  Constitutional: Positive for malaise/fatigue. Negative for chills and fever.  HENT: Negative for congestion, ear pain, sinus pain and sore throat.   Eyes: Negative for blurred vision, discharge and redness.  Respiratory: Positive for cough, sputum production, shortness of breath and wheezing. Negative for stridor.   Cardiovascular: Negative for chest pain, palpitations and leg swelling.  Gastrointestinal: Negative for abdominal pain, diarrhea, nausea and vomiting.  Musculoskeletal: Negative for myalgias.  Skin: Negative  for rash.  Neurological: Negative for dizziness and headaches.    Observations/Objective: Pt sounds like her usual self -not distressed  Is slt more  hoarse than usual No audible cough/wheeze  Able to speak in complete sentences  Nl cognition and mood  Very hard of hearing    Assessment and Plan: Problem List Items Addressed This Visit      Cardiovascular and Mediastinum   Essential hypertension    bp is up at home today in setting of recent CAP/hypoxia  Continues amlodipine 10 mg  Daily, hydralazine 100 mg tid and metoprolol 12.5 mg bid , trandolapril 4 mg daily  Home health will continue to monitor        Respiratory   Community acquired pneumonia of left lower lobe of lung    Hospitalized recently for this with sepsis (and uti)  Reviewed hospital records, lab results and studies in detail   Neg for covid or flu Much clinical improvement  Notes still coughing a lot (mostly unproductive)  Home health notes some wheezing-still tapering prednisone and using inhaler   Also recent L sided pleural eff-causing hypoxia  Will work on earlier pulmonary appt      Pleural effusion - Primary    With recent hosp (this and CAP with sepsis)  Underwent 450 mL thoracentesis with improvement in hypoxia and sob (neg cx)  2 d echo showed 65-70% EF and mild concentric LVH /grade 1 DD She has pulm appt 12/1 with Dr Galen Daft  Home care reports gradually worsening sob and 02 sats sometimes going into high 80s  Will work on earlier pulmonary appt (she saw Dr Rodman Pickle in the hospital)        Endocrine   Type 2 diabetes, controlled, with retinopathy ([place])    Lab Results  Component Value Date   HGBA1C 6.1 01/05/2020   She had inc glucose in hospital and at home in response to prednisone  Expect this to improve with weaning        Genitourinary   CKD (chronic kidney disease) stage 4, GFR 15-29 ml/min (HCC)    Fairly stable during recent hosp Reviewed hospital records, lab results and studies in detail   Was tx for e coli uti with macrobid       Frequent UTI    Pt had another uti in the hospital (e coli)  Treated with macrobid   Renal fx remained stable         Other   H/O sepsis    With CAP and uti  Reviewed hospital records, lab results and studies in detail   Clinically improved with abx incl rocephin/azithro and now macrobid          Follow Up Instructions: We will work on pulmonary follow up appt and call you  If symptoms suddenly worsen (short of breath) call and go to ER if severe    I discussed the assessment and treatment plan with the patient. The patient was provided an opportunity to ask questions and all were answered. The patient agreed with the plan and demonstrated an understanding of the instructions.   The patient was advised to call back or seek an in-person evaluation if the symptoms worsen or if the condition fails to improve as anticipated.  I provided 22 minutes of non-face-to-face time during this encounter.   [name], MD

## 2020-06-15 NOTE — Assessment & Plan Note (Signed)
Lab Results  Component Value Date   HGBA1C 6.1 01/05/2020   She had inc glucose in hospital and at home in response to prednisone  Expect this to improve with weaning

## 2020-06-15 NOTE — Assessment & Plan Note (Signed)
Pt had another uti in the hospital (e coli)  Treated with macrobid  Renal fx remained stable

## 2020-06-15 NOTE — Assessment & Plan Note (Signed)
Hospitalized recently for this with sepsis (and uti)  Reviewed hospital records, lab results and studies in detail   Neg for covid or flu Much clinical improvement  Notes still coughing a lot (mostly unproductive)  Home health notes some wheezing-still tapering prednisone and using inhaler   Also recent L sided pleural eff-causing hypoxia  Will work on earlier pulmonary appt

## 2020-06-16 ENCOUNTER — Encounter: Payer: Self-pay | Admitting: Pulmonary Disease

## 2020-06-16 ENCOUNTER — Other Ambulatory Visit: Payer: Self-pay

## 2020-06-16 ENCOUNTER — Ambulatory Visit: Payer: Medicare HMO | Admitting: Pulmonary Disease

## 2020-06-16 VITALS — BP 134/76 | HR 62 | Temp 97.3°F | Ht 65.0 in | Wt 180.0 lb

## 2020-06-16 DIAGNOSIS — J208 Acute bronchitis due to other specified organisms: Secondary | ICD-10-CM

## 2020-06-16 DIAGNOSIS — N184 Chronic kidney disease, stage 4 (severe): Secondary | ICD-10-CM

## 2020-06-16 DIAGNOSIS — J948 Other specified pleural conditions: Secondary | ICD-10-CM

## 2020-06-16 MED ORDER — FUROSEMIDE 20 MG PO TABS: 10.0000 mg | ORAL_TABLET | Freq: Every day | ORAL | 0 refills | Status: DC

## 2020-06-16 MED ORDER — DOXYCYCLINE HYCLATE 100 MG PO TABS: 100.0000 mg | ORAL_TABLET | Freq: Two times a day (BID) | ORAL | 0 refills | Status: DC

## 2020-06-16 NOTE — Progress Notes (Signed)
[facility]  Chief Complaint  Patient presents with  . Consult    recent pneumonia, fluid on lungs, cough with white mucus, wheezing. denies SOB.    Constitutional:  BP 134/76 (BP Location: Left Arm, Patient Position: Sitting, Cuff Size: Normal)   Pulse 62   Temp (!) 97.3 F (36.3 C) (Temporal)   Ht 5' 5"  (1.651 m)   Wt 180 lb (81.6 kg)   SpO2 94%   BMI 29.95 kg/m   Past Medical History:  Angioedema from voltaren, Pneumonia 2018, DJD, DM type 2, HLD, HTN, OA, CVA, Vit B 12 deficiency, CKD 3b  Past Surgical History:  Her  has a past surgical history that includes Eye surgery; Abdominal hysterectomy; Knee surgery; Spine surgery (08/09); Retinal detachment surgery (02/18/11); Back surgery; Colon surgery; Appendectomy; Pars plana vitrectomy (07/31/2011); and IR THORACENTESIS ASP PLEURAL SPACE W/IMG GUIDE (06/07/2020).  Brief Summary:  Renee [name] is a 86 y.o. female with pleural effusion.      Subjective:   She was in hospital in [place] earlier this month with fever, dyspnea, and cough after getting COVID vaccine.  She was treated for pneumonia.  She was found to have a Lt pleural effusion.  Thoracentesis results consistent with transudate.  She is here with her sister.    She has more cough and yellow sputum over the past few days.  No fever or chest pain.  Has more leg swelling.  Wasn't sent home on lasix - was told previously that it would affect her kidneys.  Still sleeping in a chair at home - she was told by someone in the hospital she couldn't sleep in a bed.  Physical Exam:   Appearance - sitting in wheelchair  ENMT - no sinus tenderness, no oral exudate, no LAN, Mallampati 2 airway, no stridor  Respiratory - decreased BS Lt base, no wheeze  CV - s1s2 regular rate and rhythm, no murmurs  Ext - 1+ lower extremity edema  Skin - no rashes  Psych - normal mood and affect   Pulmonary testing:   Lt thoracentesis  06/07/20 >> 450 ml, glucose 293, protein < 3, LDH 62, WBC 1550 (51N, 20M, 21L), culture and cytology negative  Chest Imaging:   CT chest 05/21/20 >> small pericardial effusion, atherosclerosis, small/mod Lt pleural effusion, granuloma LUL, small HH  Cardiac Tests:   Echo 06/09/20 >> EF 65 to 70%, grade 1 DD  Social History:  She  reports that she has never smoked. She has never used smokeless tobacco. She reports that she does not drink alcohol and does not use drugs.  Family History:  Her family history includes COPD in her brother; Cancer in her sister; Heart disease in her sister.     Assessment/Plan:   Transudate Lt pleural effusion with diastolic CHF. - will have her start lasix 10 mg daily - will have her f/u with Dr. Rodman Pickle in [place] office - she will need a chest xray and BMET at the time of her visit in 2 weeks  Acute bronchitis. - will give her course of doxycycline   Time Spent Involved in Patient Care on Day of Examination:  34 minutes  Follow up:  Patient Instructions  Doxycycline (antibiotic) 100 mg twice per day for 7 days.  Lasix 20 mg pill >> take 1/2 pill daily in the morning.  Follow up in 2 weeks with Dr. Loanne Drilling or Nurse Practitioner in [place] office; You will need a chest  xray and BMET at the time of your visit in 2 weeks.   Medication List:   Allergies as of 06/16/2020      Reactions   Diclofenac Sodium Other (See Comments)   REACTION: angioedema   Pioglitazone Swelling      Medication List       Accurate as of June 16, 2020 12:40 PM. If you have any questions, ask your nurse or doctor.        STOP taking these medications   predniSONE 10 MG tablet Commonly known as: DELTASONE Stopped by: Chesley Mires, MD     TAKE these medications   acetaminophen 325 MG tablet Commonly known as: TYLENOL Take 650 mg by mouth at bedtime.   albuterol 108 (90 Base) MCG/ACT inhaler Commonly known as: VENTOLIN HFA Inhale 2 puffs  into the lungs every 4 (four) hours as needed for wheezing or shortness of breath.   ALPRAZolam 0.5 MG tablet Commonly known as: XANAX TAKE 1/2 TO 1 TABLET BY MOUTH TWICE A DAY AS NEEDED FOR ANXIETY What changed: See the new instructions.   amLODipine 10 MG tablet Commonly known as: NORVASC Take 1 tablet (10 mg total) by mouth daily.   aspirin 325 MG tablet Take 325 mg by mouth daily.   calcium-vitamin D 500-200 MG-UNIT tablet Commonly known as: OSCAL WITH D Take 1 tablet by mouth daily.   Cranberry 500 MG Tabs Take 500 mg by mouth in the morning and at bedtime.   docusate sodium 100 MG capsule Commonly known as: COLACE Take 200 mg by mouth at bedtime.   doxycycline 100 MG tablet Commonly known as: VIBRA-TABS Take 1 tablet (100 mg total) by mouth 2 (two) times daily. Started by: Chesley Mires, MD   ferrous sulfate 325 (65 FE) MG tablet Take 325 mg by mouth 2 (two) times daily.   furosemide 20 MG tablet Commonly known as: Lasix Take 0.5 tablets (10 mg total) by mouth daily. Started by: Chesley Mires, MD   gemfibrozil 600 MG tablet Commonly known as: LOPID TAKE 1 TABLET BY MOUTH EVERY DAY   glipiZIDE 2.5 MG 24 hr tablet Commonly known as: GLUCOTROL XL TAKE 1 TABLET BY MOUTH EVERY DAY WITH BREAKFAST What changed: See the new instructions.   guaiFENesin-dextromethorphan 100-10 MG/5ML syrup Commonly known as: ROBITUSSIN DM Take 5 mLs by mouth every 4 (four) hours as needed for cough.   hydrALAZINE 100 MG tablet Commonly known as: APRESOLINE Take 100 mg by mouth 3 (three) times daily.   metoprolol succinate 25 MG 24 hr tablet Commonly known as: TOPROL-XL Take 0.5 tablets (12.5 mg total) by mouth 2 (two) times daily.   mirtazapine 15 MG tablet Commonly known as: REMERON TAKE 1/2 TABLET BY MOUTH AT BEDTIME   multivitamin capsule Take 1 capsule by mouth daily.   nitrofurantoin (macrocrystal-monohydrate) 100 MG capsule Commonly known as: Macrobid Take 1 capsule  (100 mg total) by mouth 2 (two) times daily.   ONE TOUCH ULTRA 2 w/Device Kit Check blood sugar once daily and as directed. Dx O32.9   OneTouch Delica Lancets 19T Misc CHECK BLOOD SUGAR ONCE DAILY AND AS DIRECTED. DX E11.9 What changed: See the new instructions.   OneTouch Ultra test strip Generic drug: glucose blood CHECK BLOOD SUGAR ONCE DAILY AND AS DIRECTED. DX E11.9   PATADAY OP Place 1 drop into both eyes daily.   PreserVision AREDS 2+Multi Vit Caps Take 1 capsule by mouth in the morning and at bedtime.   sertraline 50 MG tablet  Commonly known as: ZOLOFT TAKE 1 TABLET BY MOUTH EVERY DAY   trandolapril 4 MG tablet Commonly known as: MAVIK Take 1 tablet (4 mg total) by mouth daily.   triamcinolone 55 MCG/ACT Aero nasal inhaler Commonly known as: NASACORT Place 2 sprays into the nose daily. What changed: when to take this   vitamin B-12 1000 MCG tablet Commonly known as: CYANOCOBALAMIN Take 1,000 mcg by mouth daily.   Vitamin D 1000 units capsule Take 1,000 Units by mouth daily.       Signature:  Chesley Mires, MD [facility] Pager - [phone] 06/16/2020, 12:40 PM

## 2020-06-16 NOTE — Patient Instructions (Signed)
Doxycycline (antibiotic) 100 mg twice per day for 7 days.  Lasix 20 mg pill >> take 1/2 pill daily in the morning.  Follow up in 2 weeks with Dr. Loanne Drilling or Nurse Practitioner in [place] office; You will need a chest xray and BMET at the time of your visit in 2 weeks.

## 2020-06-21 DIAGNOSIS — M519 Unspecified thoracic, thoracolumbar and lumbosacral intervertebral disc disorder: Secondary | ICD-10-CM

## 2020-06-21 DIAGNOSIS — J189 Pneumonia, unspecified organism: Secondary | ICD-10-CM | POA: Diagnosis not present

## 2020-06-21 DIAGNOSIS — F419 Anxiety disorder, unspecified: Secondary | ICD-10-CM

## 2020-06-21 DIAGNOSIS — A4151 Sepsis due to Escherichia coli [E. coli]: Secondary | ICD-10-CM | POA: Diagnosis not present

## 2020-06-21 DIAGNOSIS — D631 Anemia in chronic kidney disease: Secondary | ICD-10-CM | POA: Diagnosis not present

## 2020-06-21 DIAGNOSIS — I1310 Hypertensive heart and chronic kidney disease without heart failure, with stage 1 through stage 4 chronic kidney disease, or unspecified chronic kidney disease: Secondary | ICD-10-CM | POA: Diagnosis not present

## 2020-06-21 DIAGNOSIS — E538 Deficiency of other specified B group vitamins: Secondary | ICD-10-CM

## 2020-06-21 DIAGNOSIS — I69354 Hemiplegia and hemiparesis following cerebral infarction affecting left non-dominant side: Secondary | ICD-10-CM

## 2020-06-21 DIAGNOSIS — R911 Solitary pulmonary nodule: Secondary | ICD-10-CM

## 2020-06-21 DIAGNOSIS — E1122 Type 2 diabetes mellitus with diabetic chronic kidney disease: Secondary | ICD-10-CM

## 2020-06-21 DIAGNOSIS — N1832 Chronic kidney disease, stage 3b: Secondary | ICD-10-CM | POA: Diagnosis not present

## 2020-06-21 DIAGNOSIS — M8580 Other specified disorders of bone density and structure, unspecified site: Secondary | ICD-10-CM

## 2020-06-21 DIAGNOSIS — Z9049 Acquired absence of other specified parts of digestive tract: Secondary | ICD-10-CM

## 2020-06-21 DIAGNOSIS — Z9849 Cataract extraction status, unspecified eye: Secondary | ICD-10-CM

## 2020-06-21 DIAGNOSIS — G4730 Sleep apnea, unspecified: Secondary | ICD-10-CM

## 2020-06-21 DIAGNOSIS — E1165 Type 2 diabetes mellitus with hyperglycemia: Secondary | ICD-10-CM

## 2020-06-21 DIAGNOSIS — Z9181 History of falling: Secondary | ICD-10-CM

## 2020-06-21 DIAGNOSIS — F32A Depression, unspecified: Secondary | ICD-10-CM

## 2020-06-21 DIAGNOSIS — E785 Hyperlipidemia, unspecified: Secondary | ICD-10-CM

## 2020-06-21 DIAGNOSIS — M1990 Unspecified osteoarthritis, unspecified site: Secondary | ICD-10-CM

## 2020-06-21 DIAGNOSIS — N390 Urinary tract infection, site not specified: Secondary | ICD-10-CM | POA: Diagnosis not present

## 2020-06-21 DIAGNOSIS — J90 Pleural effusion, not elsewhere classified: Secondary | ICD-10-CM | POA: Diagnosis not present

## 2020-06-21 DIAGNOSIS — Z7952 Long term (current) use of systemic steroids: Secondary | ICD-10-CM

## 2020-06-21 DIAGNOSIS — Z7982 Long term (current) use of aspirin: Secondary | ICD-10-CM

## 2020-06-21 DIAGNOSIS — Z792 Long term (current) use of antibiotics: Secondary | ICD-10-CM

## 2020-06-23 DIAGNOSIS — D631 Anemia in chronic kidney disease: Secondary | ICD-10-CM | POA: Diagnosis not present

## 2020-06-23 DIAGNOSIS — I69354 Hemiplegia and hemiparesis following cerebral infarction affecting left non-dominant side: Secondary | ICD-10-CM | POA: Diagnosis not present

## 2020-06-23 DIAGNOSIS — J90 Pleural effusion, not elsewhere classified: Secondary | ICD-10-CM | POA: Diagnosis not present

## 2020-06-23 DIAGNOSIS — E1122 Type 2 diabetes mellitus with diabetic chronic kidney disease: Secondary | ICD-10-CM | POA: Diagnosis not present

## 2020-06-23 DIAGNOSIS — E1165 Type 2 diabetes mellitus with hyperglycemia: Secondary | ICD-10-CM | POA: Diagnosis not present

## 2020-06-23 DIAGNOSIS — A4151 Sepsis due to Escherichia coli [E. coli]: Secondary | ICD-10-CM | POA: Diagnosis not present

## 2020-06-23 DIAGNOSIS — N390 Urinary tract infection, site not specified: Secondary | ICD-10-CM | POA: Diagnosis not present

## 2020-06-23 DIAGNOSIS — I1310 Hypertensive heart and chronic kidney disease without heart failure, with stage 1 through stage 4 chronic kidney disease, or unspecified chronic kidney disease: Secondary | ICD-10-CM | POA: Diagnosis not present

## 2020-06-23 DIAGNOSIS — N1832 Chronic kidney disease, stage 3b: Secondary | ICD-10-CM | POA: Diagnosis not present

## 2020-06-23 DIAGNOSIS — J189 Pneumonia, unspecified organism: Secondary | ICD-10-CM | POA: Diagnosis not present

## 2020-06-24 DIAGNOSIS — E1165 Type 2 diabetes mellitus with hyperglycemia: Secondary | ICD-10-CM | POA: Diagnosis not present

## 2020-06-24 DIAGNOSIS — A4151 Sepsis due to Escherichia coli [E. coli]: Secondary | ICD-10-CM | POA: Diagnosis not present

## 2020-06-24 DIAGNOSIS — J90 Pleural effusion, not elsewhere classified: Secondary | ICD-10-CM | POA: Diagnosis not present

## 2020-06-24 DIAGNOSIS — N390 Urinary tract infection, site not specified: Secondary | ICD-10-CM | POA: Diagnosis not present

## 2020-06-24 DIAGNOSIS — E1122 Type 2 diabetes mellitus with diabetic chronic kidney disease: Secondary | ICD-10-CM | POA: Diagnosis not present

## 2020-06-24 DIAGNOSIS — I69354 Hemiplegia and hemiparesis following cerebral infarction affecting left non-dominant side: Secondary | ICD-10-CM | POA: Diagnosis not present

## 2020-06-24 DIAGNOSIS — I1310 Hypertensive heart and chronic kidney disease without heart failure, with stage 1 through stage 4 chronic kidney disease, or unspecified chronic kidney disease: Secondary | ICD-10-CM | POA: Diagnosis not present

## 2020-06-24 DIAGNOSIS — J189 Pneumonia, unspecified organism: Secondary | ICD-10-CM | POA: Diagnosis not present

## 2020-06-24 DIAGNOSIS — N1832 Chronic kidney disease, stage 3b: Secondary | ICD-10-CM | POA: Diagnosis not present

## 2020-06-24 DIAGNOSIS — D631 Anemia in chronic kidney disease: Secondary | ICD-10-CM | POA: Diagnosis not present

## 2020-06-27 DIAGNOSIS — A4151 Sepsis due to Escherichia coli [E. coli]: Secondary | ICD-10-CM | POA: Diagnosis not present

## 2020-06-27 DIAGNOSIS — D631 Anemia in chronic kidney disease: Secondary | ICD-10-CM | POA: Diagnosis not present

## 2020-06-27 DIAGNOSIS — E1122 Type 2 diabetes mellitus with diabetic chronic kidney disease: Secondary | ICD-10-CM | POA: Diagnosis not present

## 2020-06-27 DIAGNOSIS — I1310 Hypertensive heart and chronic kidney disease without heart failure, with stage 1 through stage 4 chronic kidney disease, or unspecified chronic kidney disease: Secondary | ICD-10-CM | POA: Diagnosis not present

## 2020-06-27 DIAGNOSIS — N390 Urinary tract infection, site not specified: Secondary | ICD-10-CM | POA: Diagnosis not present

## 2020-06-27 DIAGNOSIS — N1832 Chronic kidney disease, stage 3b: Secondary | ICD-10-CM | POA: Diagnosis not present

## 2020-06-27 DIAGNOSIS — E1165 Type 2 diabetes mellitus with hyperglycemia: Secondary | ICD-10-CM | POA: Diagnosis not present

## 2020-06-27 DIAGNOSIS — J189 Pneumonia, unspecified organism: Secondary | ICD-10-CM | POA: Diagnosis not present

## 2020-06-27 DIAGNOSIS — I69354 Hemiplegia and hemiparesis following cerebral infarction affecting left non-dominant side: Secondary | ICD-10-CM | POA: Diagnosis not present

## 2020-06-27 DIAGNOSIS — J90 Pleural effusion, not elsewhere classified: Secondary | ICD-10-CM | POA: Diagnosis not present

## 2020-06-29 ENCOUNTER — Telehealth: Payer: Self-pay

## 2020-06-29 DIAGNOSIS — J189 Pneumonia, unspecified organism: Secondary | ICD-10-CM | POA: Diagnosis not present

## 2020-06-29 DIAGNOSIS — N1832 Chronic kidney disease, stage 3b: Secondary | ICD-10-CM | POA: Diagnosis not present

## 2020-06-29 DIAGNOSIS — I1310 Hypertensive heart and chronic kidney disease without heart failure, with stage 1 through stage 4 chronic kidney disease, or unspecified chronic kidney disease: Secondary | ICD-10-CM | POA: Diagnosis not present

## 2020-06-29 DIAGNOSIS — I69354 Hemiplegia and hemiparesis following cerebral infarction affecting left non-dominant side: Secondary | ICD-10-CM | POA: Diagnosis not present

## 2020-06-29 DIAGNOSIS — J90 Pleural effusion, not elsewhere classified: Secondary | ICD-10-CM | POA: Diagnosis not present

## 2020-06-29 DIAGNOSIS — A4151 Sepsis due to Escherichia coli [E. coli]: Secondary | ICD-10-CM | POA: Diagnosis not present

## 2020-06-29 DIAGNOSIS — E1165 Type 2 diabetes mellitus with hyperglycemia: Secondary | ICD-10-CM | POA: Diagnosis not present

## 2020-06-29 DIAGNOSIS — E1122 Type 2 diabetes mellitus with diabetic chronic kidney disease: Secondary | ICD-10-CM | POA: Diagnosis not present

## 2020-06-29 DIAGNOSIS — N390 Urinary tract infection, site not specified: Secondary | ICD-10-CM | POA: Diagnosis not present

## 2020-06-29 DIAGNOSIS — D631 Anemia in chronic kidney disease: Secondary | ICD-10-CM | POA: Diagnosis not present

## 2020-06-29 NOTE — Addendum Note (Signed)
Addended by: Vanessa Barbara on: 06/29/2020 09:20 AM   Modules accepted: Orders

## 2020-06-29 NOTE — Telephone Encounter (Signed)
Please verbally ok that order.  Continue to watch bp and if no improvement in the next several weeks alert me

## 2020-06-29 NOTE — Telephone Encounter (Signed)
Elmyra Ricks with [facility] left v/m requesting verbal orders for [facility] OT evaluation. Will probably be end of next wk when [facility] OT eval could be done. Elmyra Ricks also wanted Dr Glori Bickers to know that since pt has been home BP is running 150 -160/80.

## 2020-06-30 ENCOUNTER — Encounter: Payer: Self-pay | Admitting: Adult Health

## 2020-06-30 ENCOUNTER — Ambulatory Visit: Payer: Medicare HMO | Admitting: Adult Health

## 2020-06-30 ENCOUNTER — Other Ambulatory Visit: Payer: Self-pay

## 2020-06-30 ENCOUNTER — Ambulatory Visit (INDEPENDENT_AMBULATORY_CARE_PROVIDER_SITE_OTHER): Payer: Medicare HMO

## 2020-06-30 DIAGNOSIS — J90 Pleural effusion, not elsewhere classified: Secondary | ICD-10-CM | POA: Diagnosis not present

## 2020-06-30 DIAGNOSIS — E1165 Type 2 diabetes mellitus with hyperglycemia: Secondary | ICD-10-CM | POA: Diagnosis not present

## 2020-06-30 DIAGNOSIS — D631 Anemia in chronic kidney disease: Secondary | ICD-10-CM | POA: Diagnosis not present

## 2020-06-30 DIAGNOSIS — E1122 Type 2 diabetes mellitus with diabetic chronic kidney disease: Secondary | ICD-10-CM | POA: Diagnosis not present

## 2020-06-30 DIAGNOSIS — I1310 Hypertensive heart and chronic kidney disease without heart failure, with stage 1 through stage 4 chronic kidney disease, or unspecified chronic kidney disease: Secondary | ICD-10-CM | POA: Diagnosis not present

## 2020-06-30 DIAGNOSIS — N390 Urinary tract infection, site not specified: Secondary | ICD-10-CM | POA: Diagnosis not present

## 2020-06-30 DIAGNOSIS — N184 Chronic kidney disease, stage 4 (severe): Secondary | ICD-10-CM

## 2020-06-30 DIAGNOSIS — A4151 Sepsis due to Escherichia coli [E. coli]: Secondary | ICD-10-CM | POA: Diagnosis not present

## 2020-06-30 DIAGNOSIS — J189 Pneumonia, unspecified organism: Secondary | ICD-10-CM

## 2020-06-30 DIAGNOSIS — N1832 Chronic kidney disease, stage 3b: Secondary | ICD-10-CM | POA: Diagnosis not present

## 2020-06-30 DIAGNOSIS — J208 Acute bronchitis due to other specified organisms: Secondary | ICD-10-CM | POA: Diagnosis not present

## 2020-06-30 DIAGNOSIS — J948 Other specified pleural conditions: Secondary | ICD-10-CM | POA: Diagnosis not present

## 2020-06-30 DIAGNOSIS — I69354 Hemiplegia and hemiparesis following cerebral infarction affecting left non-dominant side: Secondary | ICD-10-CM | POA: Diagnosis not present

## 2020-06-30 NOTE — Addendum Note (Signed)
Addended by: Suzzanne Cloud E on: 06/30/2020 04:20 PM   Modules accepted: Orders

## 2020-06-30 NOTE — Telephone Encounter (Signed)
Dr. Marliss Coots comments and verbal order given to [facility]

## 2020-06-30 NOTE — Progress Notes (Signed)
_0  ID: Renee [name], female    DOB: 10-13-1928, 86 y.o.   MRN: [id number]  Chief Complaint  Patient presents with  . Follow-up    doing much better.  pt still clears her throat but the cough is gone.     Referring provider: Abner Greenspan, MD  HPI: 86 year old female seen for pulmonary consult during hospitalization October 2021 for pneumonia and left transudate of pleural effusion requiring thoracentesis. Medical history significant for diabetes, hyperlipidemia, hypertension, stroke, stage IIIb chronic kidney disease  TEST/EVENTS :   Lt thoracentesis 06/07/20 >> 450 ml, glucose 293, protein < 3, LDH 62, WBC 1550 (51N, 67M, 21L), culture and cytology negative  Chest Imaging:   CT chest 05/21/20 >> small pericardial effusion, atherosclerosis, small/mod Lt pleural effusion, granuloma LUL, small HH  Cardiac Tests:   Echo 06/09/20 >> EF 65 to 70%, grade 1 DD  06/30/2020 Follow up : Pneumonia, left pleural effusion Patient returns for a 2-week follow-up.  Patient was seen last visit for a post hospital follow-up.  She was admitted in October for pneumonia and a transudate of left pleural effusion. She required a thoracentesis with 450 cc of fluid removed.  Culture and cytology were negative. Last visit patient was started on Lasix 10 mg daily.  And given doxycycline for ongoing cough and congestion. Since last visit patient is feeling better with decreased cough and congestion.  Breathing seems to be improved.   Chest x-ray today shows no significant change and moderate left pleural effusion and airspace disease.  Appetite is good.  No nausea vomiting diarrhea or hemoptysis. Since starting Lasix feels that her lower extremity swelling is much better.      Allergies  Allergen Reactions  . Diclofenac Sodium Other (See Comments)    REACTION: angioedema  . Pioglitazone Swelling    Immunization History  Administered Date(s) Administered  . Fluad Quad(high Dose 65+)  06/02/2019, 05/13/2020  . Influenza Split 05/21/2011, 06/11/2012  . Influenza Whole 05/26/2004, 05/23/2010  . Influenza,inj,Quad PF,6+ Mos 04/17/2013, 04/27/2014, 05/20/2015, 05/22/2016, 05/22/2017, 05/22/2018  . PFIZER SARS-COV-2 Vaccination 10/16/2019, 11/17/2019  . Pneumococcal Conjugate-13 04/27/2014  . Pneumococcal Polysaccharide-23 09/26/2005  . Td 05/21/1999, 04/04/2012  . Zoster Recombinat (Shingrix) 01/06/2020, 03/15/2020    Past Medical History:  Diagnosis Date  . Angioedema    possibly from voltaren  . Bronchopneumonia 12/11/2016  . Degenerative disc disease   . Diabetes mellitus    type II  . Hyperlipidemia   . Hypertension   . LVH (left ventricular hypertrophy)    and atrial enlargement by echo in past with nl EF  . Nasal pruritis   . Osteoarthritis   . Osteopenia   . Renal insufficiency   . Sleep apnea   . Stroke [facility]) 05/2010   Small vessel sobcortical (in [place] trial) with Dr Leonie Man, residual L hemiparesis  . Vitamin B 12 deficiency 04/08    Tobacco History: Social History   Tobacco Use  Smoking Status Never Smoker  Smokeless Tobacco Never Used   Counseling given: Not Answered   Outpatient Medications Prior to Visit  Medication Sig Dispense Refill  . acetaminophen (TYLENOL) 325 MG tablet Take 650 mg by mouth at bedtime.     Marland Kitchen albuterol (VENTOLIN HFA) 108 (90 Base) MCG/ACT inhaler Inhale 2 puffs into the lungs every 4 (four) hours as needed for wheezing or shortness of breath. 18 g 3  . ALPRAZolam (XANAX) 0.5 MG tablet TAKE 1/2 TO 1 TABLET BY MOUTH TWICE A DAY AS  NEEDED FOR ANXIETY (Patient taking differently: Take 0.25-0.5 mg by mouth 2 (two) times daily as needed for anxiety. TAKE 1/2 TO 1 TABLET BY MOUTH TWICE A DAY AS NEEDED FOR ANXIETY) 60 tablet 2  . amLODipine (NORVASC) 10 MG tablet Take 1 tablet (10 mg total) by mouth daily. 90 tablet 3  . aspirin 325 MG tablet Take 325 mg by mouth daily.     . Blood Glucose Monitoring Suppl (ONE TOUCH ULTRA 2)  w/Device KIT Check blood sugar once daily and as directed. Dx E11.9 1 each 0  . calcium-vitamin D (OSCAL WITH D) 500-200 MG-UNIT per tablet Take 1 tablet by mouth daily.     . Cholecalciferol (VITAMIN D) 1000 UNITS capsule Take 1,000 Units by mouth daily.     . Cranberry 500 MG TABS Take 500 mg by mouth in the morning and at bedtime.    . docusate sodium (COLACE) 100 MG capsule Take 200 mg by mouth at bedtime.    Marland Kitchen doxycycline (VIBRA-TABS) 100 MG tablet Take 1 tablet (100 mg total) by mouth 2 (two) times daily. 14 tablet 0  . ferrous sulfate 325 (65 FE) MG tablet Take 325 mg by mouth 2 (two) times daily.    . furosemide (LASIX) 20 MG tablet Take 0.5 tablets (10 mg total) by mouth daily. 30 tablet 0  . gemfibrozil (LOPID) 600 MG tablet TAKE 1 TABLET BY MOUTH EVERY DAY (Patient taking differently: Take 600 mg by mouth daily. ) 90 tablet 2  . glipiZIDE (GLUCOTROL XL) 2.5 MG 24 hr tablet TAKE 1 TABLET BY MOUTH EVERY DAY WITH BREAKFAST (Patient taking differently: Take 2.5 mg by mouth daily with breakfast. ) 90 tablet 3  . guaiFENesin-dextromethorphan (ROBITUSSIN DM) 100-10 MG/5ML syrup Take 5 mLs by mouth every 4 (four) hours as needed for cough. 118 mL 0  . hydrALAZINE (APRESOLINE) 100 MG tablet Take 100 mg by mouth 3 (three) times daily.     . metoprolol succinate (TOPROL-XL) 25 MG 24 hr tablet Take 0.5 tablets (12.5 mg total) by mouth 2 (two) times daily. 90 tablet 3  . mirtazapine (REMERON) 15 MG tablet TAKE 1/2 TABLET BY MOUTH AT BEDTIME (Patient taking differently: Take 7.5 mg by mouth at bedtime. ) 45 tablet 3  . Multiple Vitamin (MULTIVITAMIN) capsule Take 1 capsule by mouth daily.     . Multiple Vitamins-Minerals (PRESERVISION AREDS 2+MULTI VIT) CAPS Take 1 capsule by mouth in the morning and at bedtime.    . nitrofurantoin, macrocrystal-monohydrate, (MACROBID) 100 MG capsule Take 1 capsule (100 mg total) by mouth 2 (two) times daily. 14 capsule 0  . Olopatadine HCl (PATADAY OP) Place 1 drop  into both eyes daily.    Glory Rosebush Delica Lancets 23J MISC CHECK BLOOD SUGAR ONCE DAILY AND AS DIRECTED. DX E11.9 (Patient taking differently: 1 Device by Other route daily. ) 100 each 2  . ONETOUCH ULTRA test strip CHECK BLOOD SUGAR ONCE DAILY AND AS DIRECTED. DX E11.9 100 strip 1  . sertraline (ZOLOFT) 50 MG tablet TAKE 1 TABLET BY MOUTH EVERY DAY (Patient taking differently: Take 50 mg by mouth daily. ) 90 tablet 2  . trandolapril (MAVIK) 4 MG tablet Take 1 tablet (4 mg total) by mouth daily. 90 tablet 3  . triamcinolone (NASACORT) 55 MCG/ACT AERO nasal inhaler Place 2 sprays into the nose daily. (Patient taking differently: Place 2 sprays into the nose at bedtime. ) 1 each 5  . vitamin B-12 (CYANOCOBALAMIN) 1000 MCG tablet Take 1,000 mcg  by mouth daily.      No facility-administered medications prior to visit.     Review of Systems:   Constitutional:   No  weight loss, night sweats,  Fevers, chills,  +fatigue, or  lassitude.  HEENT:   No headaches,  Difficulty swallowing,  Tooth/dental problems, or  Sore throat,                No sneezing, itching, ear ache, nasal congestion, post nasal drip,   CV:  No chest pain,  Orthopnea, PND, swelling in lower extremities, anasarca, dizziness, palpitations, syncope.   GI  No heartburn, indigestion, abdominal pain, nausea, vomiting, diarrhea, change in bowel habits, loss of appetite, bloody stools.   Resp:  .  No chest wall deformity  Skin: no rash or lesions.  GU: no dysuria, change in color of urine, no urgency or frequency.  No flank pain, no hematuria   MS:  No joint pain or swelling.  No decreased range of motion.  No back pain.    Physical Exam  BP 140/60   Pulse 68   Temp (!) 97.3 F (36.3 C) (Oral)   Wt 172 lb 6 oz (78.2 kg)   SpO2 97%   BMI 28.68 kg/m   GEN: A/Ox3; pleasant , NAD, elderly in wheelchair   HEENT:  [place]/AT,  EACs-clear, TMs-wnl, NOSE-clear, THROAT-clear, no lesions, no postnasal drip or exudate noted.    NECK:  Supple w/ fair ROM; no JVD; normal carotid impulses w/o bruits; no thyromegaly or nodules palpated; no lymphadenopathy.    RESP  Clear  P & A; w/o, wheezes/ rales/ or rhonchi. no accessory muscle use, no dullness to percussion  CARD:  RRR, no m/r/g, tr  peripheral edema, pulses intact, no cyanosis or clubbing.  GI:   Soft & nt; nml bowel sounds; no organomegaly or masses detected.   Musco: Warm bil, no deformities or joint swelling noted.   Neuro: alert, no focal deficits noted.    Skin: Warm, no lesions or rashes    Lab Results:  CBC    Component Value Date/Time   WBC 6.0 06/11/2020 0123   RBC 3.57 (L) 06/11/2020 0123   HGB 10.0 (L) 06/11/2020 0123   HGB 11.5 (L) 03/06/2013 1441   HCT 32.5 (L) 06/11/2020 0123   HCT 32.8 (L) 01/29/2013 1325   PLT 170 06/11/2020 0123   PLT 182 01/29/2013 1325   MCV 91.0 06/11/2020 0123   MCV 77 (L) 01/29/2013 1325   MCH 28.0 06/11/2020 0123   MCHC 30.8 06/11/2020 0123   RDW 13.3 06/11/2020 0123   RDW 17.2 (H) 01/29/2013 1325   LYMPHSABS 0.9 06/11/2020 0123   LYMPHSABS 0.8 (L) 01/29/2013 1325   MONOABS 0.4 06/11/2020 0123   MONOABS 0.3 01/29/2013 1325   EOSABS 0.0 06/11/2020 0123   EOSABS 0.3 01/29/2013 1325   BASOSABS 0.0 06/11/2020 0123   BASOSABS 0.1 01/29/2013 1325    BMET    Component Value Date/Time   NA 141 06/11/2020 0123   K 4.5 06/11/2020 0123   CL 109 06/11/2020 0123   CO2 24 06/11/2020 0123   GLUCOSE 172 (H) 06/11/2020 0123   BUN 26 (H) 06/11/2020 0123   CREATININE 1.28 (H) 06/11/2020 0123   CREATININE 1.45 (H) 12/27/2017 1708   CALCIUM 8.7 (L) 06/11/2020 0123   GFRNONAA 40 (L) 06/11/2020 0123   GFRAA 48 (L) 02/13/2020 0304    BNP    Component Value Date/Time   BNP 110.1 (H) 06/07/2020 1145  ProBNP No results found for: PROBNP  Imaging: DG Chest 1 View  Result Date: 06/07/2020 CLINICAL DATA:  Status post left thoracentesis EXAM: CHEST  1 VIEW COMPARISON:  Same day chest radiograph  FINDINGS: Stable cardiomegaly. Interval decrease of left-sided pleural effusion. Persistent opacity within the left lung base may reflect combination of atelectasis, consolidation, and trace residual fluid. No pneumothorax identified. Right lung is clear. IMPRESSION: Interval decrease of left-sided pleural effusion. No pneumothorax. Electronically Signed   By: Davina Poke D.O.   On: 06/07/2020 16:14   DG Chest 2 View  Result Date: 06/07/2020 CLINICAL DATA:  Shortness of breath. EXAM: CHEST - 2 VIEW COMPARISON:  05/13/2020 chest radiograph and prior. 05/24/2020 CT chest. FINDINGS: Left basilar consolidation and small to moderate left pleural effusion are more conspicuous than prior exam. Clear right lung. No pneumothorax. Partially obscured cardiomediastinal silhouette. Osseous structures are unchanged. IMPRESSION: Increased conspicuity of left basilar consolidation. Increased small to moderate left pleural effusion. Electronically Signed   By: Primitivo Gauze M.D.   On: 06/07/2020 10:12   DG CHEST PORT 1 VIEW  Result Date: 06/09/2020 CLINICAL DATA:  Pleural effusion EXAM: PORTABLE CHEST 1 VIEW COMPARISON:  06/07/2020 FINDINGS: Lung volumes are small. There is progressive retrocardiac opacification which may relate to a small left pleural effusion, atelectasis, infiltrate, or a combination of these findings. Discoid atelectasis within the left upper lung zone. Right lung is clear. No pneumothorax. Cardiac size within normal limits. No acute bone abnormality. IMPRESSION: Progressive retrocardiac opacification. Progressive pulmonary hypoinflation. Electronically Signed   By: Fidela Salisbury MD   On: 06/09/2020 15:13   ECHOCARDIOGRAM COMPLETE  Result Date: 06/09/2020    ECHOCARDIOGRAM REPORT   Patient Name:   Renee [name] Date of Exam: 06/09/2020 Medical Rec #:  [id number]      Height:       62.0 in Accession #:    [id number]     Weight:       170.0 lb Date of Birth:  11/26/1928      BSA:           1.784 m Patient Age:    30 years       BP:           144/77 mmHg Patient Gender: F              HR:           74 bpm. Exam Location:  Inpatient Procedure: 2D Echo, Cardiac Doppler, Color Doppler and Intracardiac            Opacification Agent Indications:    Fever 780.6 / R50.9  History:        Patient has prior history of Echocardiogram examinations, most                 recent 06/04/2010. Stroke; Risk Factors:Hypertension, Diabetes,                 Dyslipidemia and Non-Smoker. LVH.  Sonographer:    Vickie Epley RDCS Referring Phys: [id number] [place]  1. Left ventricular ejection fraction, by estimation, is 65 to 70%. The left ventricle has normal function. The left ventricle has no regional wall motion abnormalities. There is mild concentric left ventricular hypertrophy. Left ventricular diastolic parameters are consistent with Grade I diastolic dysfunction (impaired relaxation). Elevated left atrial pressure.  2. Right ventricular systolic function is normal. The right ventricular size is normal.  3. Left atrial size was mildly dilated.  4.  A small pericardial effusion is present. The pericardial effusion is circumferential. There is no evidence of cardiac tamponade. Moderate pleural effusion in the left lateral region.  5. The mitral valve is normal in structure. No evidence of mitral valve regurgitation. No evidence of mitral stenosis.  6. The aortic valve is normal in structure. There is mild calcification of the aortic valve. There is mild thickening of the aortic valve. Aortic valve regurgitation is not visualized. Mild to moderate aortic valve sclerosis/calcification is present, without any evidence of aortic stenosis.  7. The inferior vena cava is normal in size with greater than 50% respiratory variability, suggesting right atrial pressure of 3 mmHg. FINDINGS  Left Ventricle: Left ventricular ejection fraction, by estimation, is 65 to 70%. The left ventricle has normal function. The left  ventricle has no regional wall motion abnormalities. Definity contrast agent was given IV to delineate the left ventricular  endocardial borders. The left ventricular internal cavity size was normal in size. There is mild concentric left ventricular hypertrophy. Left ventricular diastolic parameters are consistent with Grade I diastolic dysfunction (impaired relaxation). Elevated left atrial pressure. Right Ventricle: The right ventricular size is normal. No increase in right ventricular wall thickness. Right ventricular systolic function is normal. Left Atrium: Left atrial size was mildly dilated. Right Atrium: Right atrial size was normal in size. Pericardium: A small pericardial effusion is present. The pericardial effusion is circumferential. There is no evidence of cardiac tamponade. Mitral Valve: The mitral valve is normal in structure. No evidence of mitral valve regurgitation. No evidence of mitral valve stenosis. Tricuspid Valve: The tricuspid valve is normal in structure. Tricuspid valve regurgitation is not demonstrated. No evidence of tricuspid stenosis. Aortic Valve: The aortic valve is normal in structure. There is mild calcification of the aortic valve. There is mild thickening of the aortic valve. Aortic valve regurgitation is not visualized. Mild to moderate aortic valve sclerosis/calcification is present, without any evidence of aortic stenosis. Pulmonic Valve: The pulmonic valve was normal in structure. Pulmonic valve regurgitation is not visualized. No evidence of pulmonic stenosis. Aorta: The aortic root is normal in size and structure. Venous: The inferior vena cava is normal in size with greater than 50% respiratory variability, suggesting right atrial pressure of 3 mmHg. IAS/Shunts: No atrial level shunt detected by color flow Doppler. Additional Comments: There is a moderate pleural effusion in the left lateral region.  LEFT VENTRICLE PLAX 2D LVIDd:         5.19 cm      Diastology LVIDs:          2.71 cm      LV e' medial:    3.33 cm/s LV PW:         1.11 cm      LV E/e' medial:  22.5 LV IVS:        1.07 cm      LV e' lateral:   4.56 cm/s LVOT diam:     2.10 cm      LV E/e' lateral: 16.4 LV SV:         89 LV SV Index:   50 LVOT Area:     3.46 cm  LV Volumes (MOD) LV vol d, MOD A2C: 99.5 ml LV vol d, MOD A4C: 103.0 ml LV vol s, MOD A2C: 33.6 ml LV vol s, MOD A4C: 39.7 ml LV SV MOD A2C:     65.9 ml LV SV MOD A4C:     103.0 ml LV SV MOD BP:  64.9 ml RIGHT VENTRICLE RV S prime:     15.40 cm/s TAPSE (M-mode): 1.8 cm LEFT ATRIUM             Index       RIGHT ATRIUM           Index LA diam:        4.40 cm 2.47 cm/m  RA Area:     11.70 cm LA Vol (A2C):   34.6 ml 19.39 ml/m RA Volume:   24.70 ml  13.84 ml/m LA Vol (A4C):   51.3 ml 28.75 ml/m LA Biplane Vol: 45.4 ml 25.45 ml/m  AORTIC VALVE LVOT Vmax:   114.00 cm/s LVOT Vmean:  71.100 cm/s LVOT VTI:    0.257 m  AORTA Ao Root diam: 3.00 cm MITRAL VALVE MV Area (PHT): 3.12 cm    SHUNTS MV Decel Time: 243 msec    Systemic VTI:  0.26 m MV E velocity: 75.00 cm/s  Systemic Diam: 2.10 cm MV A velocity: 98.50 cm/s MV E/A ratio:  0.76 Ena Dawley MD Electronically signed by Ena Dawley MD Signature Date/Time: 06/09/2020/1:12:59 PM    Final    IR THORACENTESIS ASP PLEURAL SPACE W/IMG GUIDE  Result Date: 06/07/2020 INDICATION: Patient with history of CKD admitted for sepsis, dyspnea, and found to have left pleural effusion. Request is made for diagnostic and therapeutic left thoracentesis. EXAM: ULTRASOUND GUIDED DIAGNOSTIC AND THERAPEUTIC LEFT THORACENTESIS MEDICATIONS: 10 mL 1% lidocaine COMPLICATIONS: None immediate. PROCEDURE: An ultrasound guided thoracentesis was thoroughly discussed with the patient and questions answered. The benefits, risks, alternatives and complications were also discussed. The patient understands and wishes to proceed with the procedure. Written consent was obtained. Ultrasound was performed to localize and mark an  adequate pocket of fluid in the left chest. The area was then prepped and draped in the normal sterile fashion. 1% Lidocaine was used for local anesthesia. Under ultrasound guidance a 6 Fr Safe-T-Centesis catheter was introduced. Thoracentesis was performed. The catheter was removed and a dressing applied. FINDINGS: A total of approximately 450 mL of hazy amber fluid was removed. Samples were sent to the laboratory as requested by the clinical team. IMPRESSION: Successful ultrasound guided left thoracentesis yielding 450 mL of pleural fluid. Read by: Earley Abide, PA-C Electronically Signed   By: Aletta Edouard M.D.   On: 06/07/2020 16:18      No flowsheet data found.  No results found for: NITRICOXIDE      Assessment & Plan:   No problem-specific Assessment & Plan notes found for this encounter.     Rexene Edison, NP 06/30/2020

## 2020-06-30 NOTE — Assessment & Plan Note (Signed)
Clinically patient is much improved after antibiotics, thoracentesis and diuresis. Chest x-ray shows no significant change.  We will hold off on additional imaging at this time.  Repeat chest x-ray on return , if not improving consider repeating CT chest .   Plan  Patient Instructions  Continue on Lasix 10mg  daily  Low salt diet  Follow up in 6 weeks with chest xray with Dr. Halford Chessman  Or APP  Please contact office for sooner follow up if symptoms do not improve or worsen or seek emergency care

## 2020-06-30 NOTE — Patient Instructions (Signed)
Continue on Lasix 10mg  daily  Low salt diet  Follow up in 6 weeks with chest xray with Dr. Halford Chessman  Or APP  Please contact office for sooner follow up if symptoms do not improve or worsen or seek emergency care

## 2020-06-30 NOTE — Assessment & Plan Note (Signed)
Transudative Left pleural Effusion - no significant change on chest xray  Clinically patient is better continue on low-dose Lasix.  Be met today.  Plan  Patient Instructions  Continue on Lasix 53m daily  Low salt diet  Follow up in 6 weeks with chest xray with Dr. SHalford Chessman Or APP  Please contact office for sooner follow up if symptoms do not improve or worsen or seek emergency care

## 2020-07-01 ENCOUNTER — Telehealth: Payer: Self-pay | Admitting: Pulmonary Disease

## 2020-07-01 LAB — BASIC METABOLIC PANEL
BUN: 28 mg/dL — ABNORMAL HIGH (ref 6–23)
CO2: 27 mEq/L (ref 19–32)
Calcium: 8.9 mg/dL (ref 8.4–10.5)
Chloride: 102 mEq/L (ref 96–112)
Creatinine, Ser: 1.47 mg/dL — ABNORMAL HIGH (ref 0.40–1.20)
GFR: 31.51 mL/min — ABNORMAL LOW (ref >60.00)
Glucose, Bld: 301 mg/dL — ABNORMAL HIGH (ref 70–99)
Potassium: 4.5 mEq/L (ref 3.5–5.1)
Sodium: 137 mEq/L (ref 135–145)

## 2020-07-01 NOTE — Progress Notes (Signed)
Reviewed and agree with assessment/plan.   Chesley Mires, MD [facility] Pulmonary/Critical Care 07/01/2020, 8:50 AM Pager:  [phone]

## 2020-07-01 NOTE — Telephone Encounter (Signed)
BMP Latest Ref Rng & Units 06/30/2020 06/11/2020 06/10/2020  Glucose 70 - 99 mg/dL 301(H) 172(H) 112(H)  BUN 6 - 23 mg/dL 28(H) 26(H) 27(H)  Creatinine 0.40 - 1.20 mg/dL 1.47(H) 1.28(H) 1.49(H)  BUN/Creat Ratio 6 - 22 (calc) - - -  Sodium 135 - 145 mEq/L 137 141 141  Potassium 3.5 - 5.1 mEq/L 4.5 4.5 3.9  Chloride 96 - 112 mEq/L 102 109 109  CO2 19 - 32 mEq/L 27 24 25   Calcium 8.4 - 10.5 mg/dL 8.9 8.7(L) 8.7(L)    Please let her know that her kidney function is stable and she can continue taking lasix.  Her blood sugar is elevated at 301.  She should monitor her blood sugar level at home and if it remains elevated, then she should contact her PCP to address further.

## 2020-07-01 NOTE — Telephone Encounter (Signed)
Called and left message on voicemail to please return phone call. Contact number provided. 

## 2020-07-01 NOTE — Telephone Encounter (Signed)
Patient returned phone call and writer went over lab results per Dr Halford Chessman with both the patient and her caregiver, Earnest Bailey per [facility] and patient. Both expressed understanding of results and Dr Juanetta Gosling recommendations to continue taking lasix and to monitor blood sugars and to contact her PCP if they remain elevated. All questions answered. Nothing further needed at this time.

## 2020-07-04 DIAGNOSIS — E1122 Type 2 diabetes mellitus with diabetic chronic kidney disease: Secondary | ICD-10-CM | POA: Diagnosis not present

## 2020-07-04 DIAGNOSIS — A4151 Sepsis due to Escherichia coli [E. coli]: Secondary | ICD-10-CM | POA: Diagnosis not present

## 2020-07-04 DIAGNOSIS — N1832 Chronic kidney disease, stage 3b: Secondary | ICD-10-CM | POA: Diagnosis not present

## 2020-07-04 DIAGNOSIS — N390 Urinary tract infection, site not specified: Secondary | ICD-10-CM | POA: Diagnosis not present

## 2020-07-04 DIAGNOSIS — E1165 Type 2 diabetes mellitus with hyperglycemia: Secondary | ICD-10-CM | POA: Diagnosis not present

## 2020-07-04 DIAGNOSIS — J90 Pleural effusion, not elsewhere classified: Secondary | ICD-10-CM | POA: Diagnosis not present

## 2020-07-04 DIAGNOSIS — I69354 Hemiplegia and hemiparesis following cerebral infarction affecting left non-dominant side: Secondary | ICD-10-CM | POA: Diagnosis not present

## 2020-07-04 DIAGNOSIS — I1310 Hypertensive heart and chronic kidney disease without heart failure, with stage 1 through stage 4 chronic kidney disease, or unspecified chronic kidney disease: Secondary | ICD-10-CM | POA: Diagnosis not present

## 2020-07-04 DIAGNOSIS — D631 Anemia in chronic kidney disease: Secondary | ICD-10-CM | POA: Diagnosis not present

## 2020-07-04 DIAGNOSIS — J189 Pneumonia, unspecified organism: Secondary | ICD-10-CM | POA: Diagnosis not present

## 2020-07-06 DIAGNOSIS — R69 Illness, unspecified: Secondary | ICD-10-CM | POA: Diagnosis not present

## 2020-07-07 ENCOUNTER — Telehealth: Payer: Self-pay

## 2020-07-07 DIAGNOSIS — J189 Pneumonia, unspecified organism: Secondary | ICD-10-CM | POA: Diagnosis not present

## 2020-07-07 DIAGNOSIS — E1165 Type 2 diabetes mellitus with hyperglycemia: Secondary | ICD-10-CM | POA: Diagnosis not present

## 2020-07-07 DIAGNOSIS — E1122 Type 2 diabetes mellitus with diabetic chronic kidney disease: Secondary | ICD-10-CM | POA: Diagnosis not present

## 2020-07-07 DIAGNOSIS — J90 Pleural effusion, not elsewhere classified: Secondary | ICD-10-CM | POA: Diagnosis not present

## 2020-07-07 DIAGNOSIS — N1832 Chronic kidney disease, stage 3b: Secondary | ICD-10-CM | POA: Diagnosis not present

## 2020-07-07 DIAGNOSIS — D631 Anemia in chronic kidney disease: Secondary | ICD-10-CM | POA: Diagnosis not present

## 2020-07-07 DIAGNOSIS — I69354 Hemiplegia and hemiparesis following cerebral infarction affecting left non-dominant side: Secondary | ICD-10-CM | POA: Diagnosis not present

## 2020-07-07 DIAGNOSIS — I1310 Hypertensive heart and chronic kidney disease without heart failure, with stage 1 through stage 4 chronic kidney disease, or unspecified chronic kidney disease: Secondary | ICD-10-CM | POA: Diagnosis not present

## 2020-07-07 DIAGNOSIS — N390 Urinary tract infection, site not specified: Secondary | ICD-10-CM | POA: Diagnosis not present

## 2020-07-07 DIAGNOSIS — A4151 Sepsis due to Escherichia coli [E. coli]: Secondary | ICD-10-CM | POA: Diagnosis not present

## 2020-07-07 NOTE — Telephone Encounter (Signed)
Go up to glipizide 5 mg xl once daily -watching closely for hypoglycemia  (can double up on what she has or I can send in px)   Update me next week please  Thanks for letting me know

## 2020-07-07 NOTE — Telephone Encounter (Signed)
Stephanie RN with [facility] left v/m that pt FBS reading on 06/30/20 was 249. Pt is taking glucotrol 2.5 mg daily; pt has not missed taking meds. Colletta Maryland wants to know if can increase dosage.  Stephanie request cb. I spoke with Colletta Maryland; 07/07/20 FBS 199; per caregiver pt has been sweating and shakiness. Please advise.

## 2020-07-07 NOTE — Telephone Encounter (Signed)
Renee [name] notified of Dr. Marliss Coots comments. She will have pt take 2 tabs of the 2.5 mgs and update [place] next week, if med is working then she will have [place] send in new Rx next week

## 2020-07-09 DIAGNOSIS — G8190 Hemiplegia, unspecified affecting unspecified side: Secondary | ICD-10-CM | POA: Diagnosis not present

## 2020-07-11 ENCOUNTER — Encounter (INDEPENDENT_AMBULATORY_CARE_PROVIDER_SITE_OTHER): Payer: Self-pay | Admitting: Otolaryngology

## 2020-07-11 ENCOUNTER — Other Ambulatory Visit: Payer: Self-pay

## 2020-07-11 ENCOUNTER — Ambulatory Visit (INDEPENDENT_AMBULATORY_CARE_PROVIDER_SITE_OTHER): Payer: Medicare HMO | Admitting: Otolaryngology

## 2020-07-11 VITALS — Temp 97.2°F

## 2020-07-11 DIAGNOSIS — H6506 Acute serous otitis media, recurrent, bilateral: Secondary | ICD-10-CM

## 2020-07-11 MED ORDER — AMOXICILLIN-POT CLAVULANATE 875-125 MG PO TABS: 1.0000 | ORAL_TABLET | Freq: Two times a day (BID) | ORAL | 0 refills | Status: DC

## 2020-07-11 NOTE — Progress Notes (Signed)
HPI: Renee [name] is a 86 y.o. female who returns today for evaluation of decreased hearing.  She has hearing aids that she has had for years.  One of her caregivers called to have the ears cleaned as she was not hearing well with her hearing aids.  She had previously been seen 3 months ago and at that time had a left serous otitis that was treated with amoxicillin and Nasacort..  Past Medical History:  Diagnosis Date  . Angioedema    possibly from voltaren  . Bronchopneumonia 12/11/2016  . Degenerative disc disease   . Diabetes mellitus    type II  . Hyperlipidemia   . Hypertension   . LVH (left ventricular hypertrophy)    and atrial enlargement by echo in past with nl EF  . Nasal pruritis   . Osteoarthritis   . Osteopenia   . Renal insufficiency   . Sleep apnea   . Stroke [facility]) 05/2010   Small vessel sobcortical (in [place] trial) with Dr Leonie Man, residual L hemiparesis  . Vitamin B 12 deficiency 04/08   Past Surgical History:  Procedure Laterality Date  . ABDOMINAL HYSTERECTOMY     BSO-fibroids  . APPENDECTOMY    . BACK SURGERY    . COLON SURGERY     due to punctured intestines  . EYE SURGERY     cataract extraction  . IR THORACENTESIS ASP PLEURAL SPACE W/IMG GUIDE  06/07/2020  . KNEE SURGERY     arthroscope  . PARS PLANA VITRECTOMY  07/31/2011   Procedure: PARS PLANA VITRECTOMY WITH 25 GAUGE;  Surgeon: [name], MD;  Location: [place];  Service: Ophthalmology;  Laterality: Right;  REMOVAL OF SILICONE OIL AND LASER RIGHT EYE  . RETINAL DETACHMENT SURGERY  02/18/11   times 2  . SPINE SURGERY  08/09   spinal decompression surgery   Social History   Socioeconomic History  . Marital status: Widowed    Spouse name: Not on file  . Number of children: Not on file  . Years of education: Not on file  . Highest education level: Not on file  Occupational History  . Not on file  Tobacco Use  . Smoking status: Never Smoker  . Smokeless tobacco: Never Used  Vaping Use   . Vaping Use: Never assessed  Substance and Sexual Activity  . Alcohol use: No    Alcohol/week: 0.0 standard drinks  . Drug use: No  . Sexual activity: Never  Other Topics Concern  . Not on file  Social History Narrative  . Not on file   Social Determinants of Health   Financial Resource Strain:   . Difficulty of Paying Living Expenses: Not on file  Food Insecurity:   . Worried About Charity fundraiser in the Last Year: Not on file  . Ran Out of Food in the Last Year: Not on file  Transportation Needs:   . Lack of Transportation (Medical): Not on file  . Lack of Transportation (Non-Medical): Not on file  Physical Activity:   . Days of Exercise per Week: Not on file  . Minutes of Exercise per Session: Not on file  Stress:   . Feeling of Stress : Not on file  Social Connections:   . Frequency of Communication with Friends and Family: Not on file  . Frequency of Social Gatherings with Friends and Family: Not on file  . Attends Religious Services: Not on file  . Active Member of Clubs or Organizations: Not  on file  . Attends Archivist Meetings: Not on file  . Marital Status: Not on file   Family History  Problem Relation Age of Onset  . COPD Brother   . Cancer Sister        brain tumor with hemmorhage  . Heart disease Sister        CAD   Allergies  Allergen Reactions  . Diclofenac Sodium Other (See Comments)    REACTION: angioedema  . Pioglitazone Swelling   Prior to Admission medications   Medication Sig Start Date End Date Taking? Authorizing Provider  acetaminophen (TYLENOL) 325 MG tablet Take 650 mg by mouth at bedtime.    Yes [provider]  albuterol (VENTOLIN HFA) 108 (90 Base) MCG/ACT inhaler Inhale 2 puffs into the lungs every 4 (four) hours as needed for wheezing or shortness of breath. 07/13/19  Yes Tower, Wynelle Fanny, MD  ALPRAZolam (XANAX) 0.5 MG tablet TAKE 1/2 TO 1 TABLET BY MOUTH TWICE A DAY AS NEEDED FOR ANXIETY Patient taking  differently: Take 0.25-0.5 mg by mouth 2 (two) times daily as needed for anxiety. TAKE 1/2 TO 1 TABLET BY MOUTH TWICE A DAY AS NEEDED FOR ANXIETY 05/16/20  Yes Tower, Marne A, MD  amLODipine (NORVASC) 10 MG tablet Take 1 tablet (10 mg total) by mouth daily. 09/30/18  Yes Tower, Wynelle Fanny, MD  aspirin 325 MG tablet Take 325 mg by mouth daily.    Yes [provider]  Blood Glucose Monitoring Suppl (ONE TOUCH ULTRA 2) w/Device KIT Check blood sugar once daily and as directed. Dx E11.9 08/27/18  Yes Tower, Wynelle Fanny, MD  calcium-vitamin D (OSCAL WITH D) 500-200 MG-UNIT per tablet Take 1 tablet by mouth daily.    Yes [provider]  Cholecalciferol (VITAMIN D) 1000 UNITS capsule Take 1,000 Units by mouth daily.    Yes [provider]  Cranberry 500 MG TABS Take 500 mg by mouth in the morning and at bedtime.   Yes [provider]  docusate sodium (COLACE) 100 MG capsule Take 200 mg by mouth at bedtime.   Yes [provider]  doxycycline (VIBRA-TABS) 100 MG tablet Take 1 tablet (100 mg total) by mouth 2 (two) times daily. 06/16/20  Yes Chesley Mires, MD  ferrous sulfate 325 (65 FE) MG tablet Take 325 mg by mouth 2 (two) times daily.   Yes [provider]  furosemide (LASIX) 20 MG tablet Take 0.5 tablets (10 mg total) by mouth daily. 06/16/20  Yes Chesley Mires, MD  gemfibrozil (LOPID) 600 MG tablet TAKE 1 TABLET BY MOUTH EVERY DAY Patient taking differently: Take 600 mg by mouth daily.  09/08/19  Yes Tower, Marne A, MD  glipiZIDE (GLUCOTROL XL) 2.5 MG 24 hr tablet TAKE 1 TABLET BY MOUTH EVERY DAY WITH BREAKFAST Patient taking differently: Take 5 mg by mouth daily with breakfast.  07/20/19  Yes Tower, Wynelle Fanny, MD  guaiFENesin-dextromethorphan (ROBITUSSIN DM) 100-10 MG/5ML syrup Take 5 mLs by mouth every 4 (four) hours as needed for cough. 06/11/20  Yes Dahal, Marlowe Aschoff, MD  hydrALAZINE (APRESOLINE) 100 MG tablet Take 100 mg by mouth 3 (three) times daily.  08/30/14   Yes [provider]  metoprolol succinate (TOPROL-XL) 25 MG 24 hr tablet Take 0.5 tablets (12.5 mg total) by mouth 2 (two) times daily. 09/30/18  Yes Tower, Wynelle Fanny, MD  mirtazapine (REMERON) 15 MG tablet TAKE 1/2 TABLET BY MOUTH AT BEDTIME Patient taking differently: Take 7.5 mg by mouth at  bedtime.  06/16/19  Yes Tower, Wynelle Fanny, MD  Multiple Vitamin (MULTIVITAMIN) capsule Take 1 capsule by mouth daily.    Yes [provider]  Multiple Vitamins-Minerals (PRESERVISION AREDS 2+MULTI VIT) CAPS Take 1 capsule by mouth in the morning and at bedtime.   Yes [provider]  nitrofurantoin, macrocrystal-monohydrate, (MACROBID) 100 MG capsule Take 1 capsule (100 mg total) by mouth 2 (two) times daily. 06/06/20  Yes Tower, Marne A, MD  Olopatadine HCl (PATADAY OP) Place 1 drop into both eyes daily.   Yes [provider]  OneTouch Delica Lancets 95A MISC CHECK BLOOD SUGAR ONCE DAILY AND AS DIRECTED. DX E11.9 Patient taking differently: 1 Device by Other route daily.  10/23/19  Yes Tower, Wynelle Fanny, MD  ONETOUCH ULTRA test strip CHECK BLOOD SUGAR ONCE DAILY AND AS DIRECTED. DX E11.9 04/05/20  Yes Tower, Wynelle Fanny, MD  sertraline (ZOLOFT) 50 MG tablet TAKE 1 TABLET BY MOUTH EVERY DAY Patient taking differently: Take 50 mg by mouth daily.  09/08/19  Yes Tower, Wynelle Fanny, MD  trandolapril (MAVIK) 4 MG tablet Take 1 tablet (4 mg total) by mouth daily. 09/30/18  Yes Tower, Wynelle Fanny, MD  triamcinolone (NASACORT) 55 MCG/ACT AERO nasal inhaler Place 2 sprays into the nose daily. Patient taking differently: Place 2 sprays into the nose at bedtime.  03/28/20  Yes [name], MD  vitamin B-12 (CYANOCOBALAMIN) 1000 MCG tablet Take 1,000 mcg by mouth daily.    Yes [provider]  amoxicillin-clavulanate (AUGMENTIN) 875-125 MG tablet Take 1 tablet by mouth 2 (two) times daily. 07/11/20   [name], MD     Positive ROS: Otherwise negative  All other systems have  been reviewed and were otherwise negative with the exception of those mentioned in the HPI and as above.  Physical Exam: Constitutional: Alert, well-appearing, no acute distress Ears: External ears without lesions or tenderness.  She had minimal wax buildup in both ears that was cleaned with a curette.  This was nonobstructing.  However both TMs were retracted with bilateral serous otitis media.  Bilateral myringotomies were performed using phenol and a serous effusion was aspirated.  Hearing was improved. Nasal: External nose without lesions.. Clear nasal passages Oral: Lips and gums without lesions. Tongue and palate mucosa without lesions. Posterior oropharynx clear. Neck: No palpable adenopathy or masses Respiratory: Breathing comfortably  Skin: No facial/neck lesions or rash noted.  Myringotomy  Date/Time: 07/11/2020 3:53 PM Performed by: [name], MD Authorized by: [name], MD   Consent:    Consent obtained:  Verbal   Consent given by:  Patient Pre-procedure details:    Indications: serous otitis media   Anesthesia:    Anesthesia method:  Topical application   Topical anesthetic:  Phenol Procedure Details:    Location:  Left TM and right TM   Hole made in:  Anteroinferior aspect of TM   Tube:  None Findings:    Fluid:  Serous fluid Comments:     Bilateral myringotomies were performed in the office today and a serous effusion was aspirated.    Assessment: Recurrent serous otitis media.  Plan: Performed bilateral myringotomies. Prescribed amoxicillin 875 mg twice daily for 10 days as well as use of Nasacort at night. If she has recurrent serous otitis in the future may require myringotomy tubes being placed.   Radene Journey, MD

## 2020-07-13 ENCOUNTER — Other Ambulatory Visit: Payer: Self-pay | Admitting: Pulmonary Disease

## 2020-07-13 DIAGNOSIS — E1165 Type 2 diabetes mellitus with hyperglycemia: Secondary | ICD-10-CM | POA: Diagnosis not present

## 2020-07-13 DIAGNOSIS — E1122 Type 2 diabetes mellitus with diabetic chronic kidney disease: Secondary | ICD-10-CM | POA: Diagnosis not present

## 2020-07-13 DIAGNOSIS — J189 Pneumonia, unspecified organism: Secondary | ICD-10-CM | POA: Diagnosis not present

## 2020-07-13 DIAGNOSIS — N390 Urinary tract infection, site not specified: Secondary | ICD-10-CM | POA: Diagnosis not present

## 2020-07-13 DIAGNOSIS — D631 Anemia in chronic kidney disease: Secondary | ICD-10-CM | POA: Diagnosis not present

## 2020-07-13 DIAGNOSIS — J90 Pleural effusion, not elsewhere classified: Secondary | ICD-10-CM | POA: Diagnosis not present

## 2020-07-13 DIAGNOSIS — I69354 Hemiplegia and hemiparesis following cerebral infarction affecting left non-dominant side: Secondary | ICD-10-CM | POA: Diagnosis not present

## 2020-07-13 DIAGNOSIS — A4151 Sepsis due to Escherichia coli [E. coli]: Secondary | ICD-10-CM | POA: Diagnosis not present

## 2020-07-13 DIAGNOSIS — N1832 Chronic kidney disease, stage 3b: Secondary | ICD-10-CM | POA: Diagnosis not present

## 2020-07-13 DIAGNOSIS — I1310 Hypertensive heart and chronic kidney disease without heart failure, with stage 1 through stage 4 chronic kidney disease, or unspecified chronic kidney disease: Secondary | ICD-10-CM | POA: Diagnosis not present

## 2020-07-15 DIAGNOSIS — J90 Pleural effusion, not elsewhere classified: Secondary | ICD-10-CM | POA: Diagnosis not present

## 2020-07-15 DIAGNOSIS — N1832 Chronic kidney disease, stage 3b: Secondary | ICD-10-CM | POA: Diagnosis not present

## 2020-07-15 DIAGNOSIS — E1165 Type 2 diabetes mellitus with hyperglycemia: Secondary | ICD-10-CM | POA: Diagnosis not present

## 2020-07-15 DIAGNOSIS — I69354 Hemiplegia and hemiparesis following cerebral infarction affecting left non-dominant side: Secondary | ICD-10-CM | POA: Diagnosis not present

## 2020-07-15 DIAGNOSIS — J189 Pneumonia, unspecified organism: Secondary | ICD-10-CM | POA: Diagnosis not present

## 2020-07-15 DIAGNOSIS — A4151 Sepsis due to Escherichia coli [E. coli]: Secondary | ICD-10-CM | POA: Diagnosis not present

## 2020-07-15 DIAGNOSIS — E1122 Type 2 diabetes mellitus with diabetic chronic kidney disease: Secondary | ICD-10-CM | POA: Diagnosis not present

## 2020-07-15 DIAGNOSIS — I1310 Hypertensive heart and chronic kidney disease without heart failure, with stage 1 through stage 4 chronic kidney disease, or unspecified chronic kidney disease: Secondary | ICD-10-CM | POA: Diagnosis not present

## 2020-07-15 DIAGNOSIS — D631 Anemia in chronic kidney disease: Secondary | ICD-10-CM | POA: Diagnosis not present

## 2020-07-15 DIAGNOSIS — N390 Urinary tract infection, site not specified: Secondary | ICD-10-CM | POA: Diagnosis not present

## 2020-07-16 DIAGNOSIS — I1310 Hypertensive heart and chronic kidney disease without heart failure, with stage 1 through stage 4 chronic kidney disease, or unspecified chronic kidney disease: Secondary | ICD-10-CM | POA: Diagnosis not present

## 2020-07-16 DIAGNOSIS — E1165 Type 2 diabetes mellitus with hyperglycemia: Secondary | ICD-10-CM | POA: Diagnosis not present

## 2020-07-16 DIAGNOSIS — E1122 Type 2 diabetes mellitus with diabetic chronic kidney disease: Secondary | ICD-10-CM | POA: Diagnosis not present

## 2020-07-16 DIAGNOSIS — J90 Pleural effusion, not elsewhere classified: Secondary | ICD-10-CM | POA: Diagnosis not present

## 2020-07-16 DIAGNOSIS — I69354 Hemiplegia and hemiparesis following cerebral infarction affecting left non-dominant side: Secondary | ICD-10-CM | POA: Diagnosis not present

## 2020-07-16 DIAGNOSIS — J189 Pneumonia, unspecified organism: Secondary | ICD-10-CM | POA: Diagnosis not present

## 2020-07-16 DIAGNOSIS — A4151 Sepsis due to Escherichia coli [E. coli]: Secondary | ICD-10-CM | POA: Diagnosis not present

## 2020-07-16 DIAGNOSIS — N1832 Chronic kidney disease, stage 3b: Secondary | ICD-10-CM | POA: Diagnosis not present

## 2020-07-16 DIAGNOSIS — D631 Anemia in chronic kidney disease: Secondary | ICD-10-CM | POA: Diagnosis not present

## 2020-07-16 DIAGNOSIS — N390 Urinary tract infection, site not specified: Secondary | ICD-10-CM | POA: Diagnosis not present

## 2020-07-18 DIAGNOSIS — E1122 Type 2 diabetes mellitus with diabetic chronic kidney disease: Secondary | ICD-10-CM | POA: Diagnosis not present

## 2020-07-18 DIAGNOSIS — N390 Urinary tract infection, site not specified: Secondary | ICD-10-CM | POA: Diagnosis not present

## 2020-07-18 DIAGNOSIS — D631 Anemia in chronic kidney disease: Secondary | ICD-10-CM | POA: Diagnosis not present

## 2020-07-18 DIAGNOSIS — A4151 Sepsis due to Escherichia coli [E. coli]: Secondary | ICD-10-CM | POA: Diagnosis not present

## 2020-07-18 DIAGNOSIS — E1165 Type 2 diabetes mellitus with hyperglycemia: Secondary | ICD-10-CM | POA: Diagnosis not present

## 2020-07-18 DIAGNOSIS — J189 Pneumonia, unspecified organism: Secondary | ICD-10-CM | POA: Diagnosis not present

## 2020-07-18 DIAGNOSIS — J90 Pleural effusion, not elsewhere classified: Secondary | ICD-10-CM | POA: Diagnosis not present

## 2020-07-18 DIAGNOSIS — I69354 Hemiplegia and hemiparesis following cerebral infarction affecting left non-dominant side: Secondary | ICD-10-CM | POA: Diagnosis not present

## 2020-07-18 DIAGNOSIS — N1832 Chronic kidney disease, stage 3b: Secondary | ICD-10-CM | POA: Diagnosis not present

## 2020-07-18 DIAGNOSIS — I1310 Hypertensive heart and chronic kidney disease without heart failure, with stage 1 through stage 4 chronic kidney disease, or unspecified chronic kidney disease: Secondary | ICD-10-CM | POA: Diagnosis not present

## 2020-07-20 ENCOUNTER — Institutional Professional Consult (permissible substitution): Payer: Medicare HMO | Admitting: Pulmonary Disease

## 2020-07-20 DIAGNOSIS — N1832 Chronic kidney disease, stage 3b: Secondary | ICD-10-CM | POA: Diagnosis not present

## 2020-07-20 DIAGNOSIS — J189 Pneumonia, unspecified organism: Secondary | ICD-10-CM | POA: Diagnosis not present

## 2020-07-20 DIAGNOSIS — E1122 Type 2 diabetes mellitus with diabetic chronic kidney disease: Secondary | ICD-10-CM | POA: Diagnosis not present

## 2020-07-20 DIAGNOSIS — J90 Pleural effusion, not elsewhere classified: Secondary | ICD-10-CM | POA: Diagnosis not present

## 2020-07-20 DIAGNOSIS — A4151 Sepsis due to Escherichia coli [E. coli]: Secondary | ICD-10-CM | POA: Diagnosis not present

## 2020-07-20 DIAGNOSIS — E1165 Type 2 diabetes mellitus with hyperglycemia: Secondary | ICD-10-CM | POA: Diagnosis not present

## 2020-07-20 DIAGNOSIS — N390 Urinary tract infection, site not specified: Secondary | ICD-10-CM | POA: Diagnosis not present

## 2020-07-20 DIAGNOSIS — I1310 Hypertensive heart and chronic kidney disease without heart failure, with stage 1 through stage 4 chronic kidney disease, or unspecified chronic kidney disease: Secondary | ICD-10-CM | POA: Diagnosis not present

## 2020-07-20 DIAGNOSIS — D631 Anemia in chronic kidney disease: Secondary | ICD-10-CM | POA: Diagnosis not present

## 2020-07-20 DIAGNOSIS — I69354 Hemiplegia and hemiparesis following cerebral infarction affecting left non-dominant side: Secondary | ICD-10-CM | POA: Diagnosis not present

## 2020-07-21 ENCOUNTER — Other Ambulatory Visit: Payer: Self-pay | Admitting: Family Medicine

## 2020-07-22 ENCOUNTER — Other Ambulatory Visit: Payer: Self-pay | Admitting: Family Medicine

## 2020-07-26 DIAGNOSIS — D631 Anemia in chronic kidney disease: Secondary | ICD-10-CM | POA: Diagnosis not present

## 2020-07-26 DIAGNOSIS — J189 Pneumonia, unspecified organism: Secondary | ICD-10-CM | POA: Diagnosis not present

## 2020-07-26 DIAGNOSIS — N390 Urinary tract infection, site not specified: Secondary | ICD-10-CM | POA: Diagnosis not present

## 2020-07-26 DIAGNOSIS — E1122 Type 2 diabetes mellitus with diabetic chronic kidney disease: Secondary | ICD-10-CM | POA: Diagnosis not present

## 2020-07-26 DIAGNOSIS — A4151 Sepsis due to Escherichia coli [E. coli]: Secondary | ICD-10-CM | POA: Diagnosis not present

## 2020-07-26 DIAGNOSIS — I1310 Hypertensive heart and chronic kidney disease without heart failure, with stage 1 through stage 4 chronic kidney disease, or unspecified chronic kidney disease: Secondary | ICD-10-CM | POA: Diagnosis not present

## 2020-07-26 DIAGNOSIS — I69354 Hemiplegia and hemiparesis following cerebral infarction affecting left non-dominant side: Secondary | ICD-10-CM | POA: Diagnosis not present

## 2020-07-26 DIAGNOSIS — E1165 Type 2 diabetes mellitus with hyperglycemia: Secondary | ICD-10-CM | POA: Diagnosis not present

## 2020-07-26 DIAGNOSIS — N1832 Chronic kidney disease, stage 3b: Secondary | ICD-10-CM | POA: Diagnosis not present

## 2020-07-26 DIAGNOSIS — J90 Pleural effusion, not elsewhere classified: Secondary | ICD-10-CM | POA: Diagnosis not present

## 2020-07-27 DIAGNOSIS — D631 Anemia in chronic kidney disease: Secondary | ICD-10-CM | POA: Diagnosis not present

## 2020-07-27 DIAGNOSIS — N1832 Chronic kidney disease, stage 3b: Secondary | ICD-10-CM | POA: Diagnosis not present

## 2020-07-27 DIAGNOSIS — E1122 Type 2 diabetes mellitus with diabetic chronic kidney disease: Secondary | ICD-10-CM | POA: Diagnosis not present

## 2020-07-27 DIAGNOSIS — J90 Pleural effusion, not elsewhere classified: Secondary | ICD-10-CM | POA: Diagnosis not present

## 2020-07-27 DIAGNOSIS — N390 Urinary tract infection, site not specified: Secondary | ICD-10-CM | POA: Diagnosis not present

## 2020-07-27 DIAGNOSIS — J189 Pneumonia, unspecified organism: Secondary | ICD-10-CM | POA: Diagnosis not present

## 2020-07-27 DIAGNOSIS — I69354 Hemiplegia and hemiparesis following cerebral infarction affecting left non-dominant side: Secondary | ICD-10-CM | POA: Diagnosis not present

## 2020-07-27 DIAGNOSIS — A4151 Sepsis due to Escherichia coli [E. coli]: Secondary | ICD-10-CM | POA: Diagnosis not present

## 2020-07-27 DIAGNOSIS — I1310 Hypertensive heart and chronic kidney disease without heart failure, with stage 1 through stage 4 chronic kidney disease, or unspecified chronic kidney disease: Secondary | ICD-10-CM | POA: Diagnosis not present

## 2020-07-27 DIAGNOSIS — E1165 Type 2 diabetes mellitus with hyperglycemia: Secondary | ICD-10-CM | POA: Diagnosis not present

## 2020-07-29 ENCOUNTER — Telehealth: Payer: Self-pay | Admitting: Family Medicine

## 2020-07-29 NOTE — Telephone Encounter (Signed)
Verbal given 

## 2020-07-29 NOTE — Telephone Encounter (Signed)
[place], called to get verbal orders.  Home health aide to assist with bathing and dressing - 2 x a week for 2 weeks. If Lysbeth Galas doesn't answer, she has a secure Designer, television/film set.  Just make sure to leave your name and credentials.

## 2020-07-29 NOTE — Telephone Encounter (Signed)
Please ok that verbal order  

## 2020-07-30 ENCOUNTER — Other Ambulatory Visit: Payer: Self-pay | Admitting: Family Medicine

## 2020-08-01 NOTE — Telephone Encounter (Signed)
Virtual hospital f/u on 06/15/20, last filled on 06/16/19 45 tabs with 3 refills

## 2020-08-02 ENCOUNTER — Encounter: Payer: Self-pay | Admitting: Family Medicine

## 2020-08-02 DIAGNOSIS — A4151 Sepsis due to Escherichia coli [E. coli]: Secondary | ICD-10-CM | POA: Diagnosis not present

## 2020-08-02 DIAGNOSIS — N1832 Chronic kidney disease, stage 3b: Secondary | ICD-10-CM | POA: Diagnosis not present

## 2020-08-02 DIAGNOSIS — J90 Pleural effusion, not elsewhere classified: Secondary | ICD-10-CM | POA: Diagnosis not present

## 2020-08-02 DIAGNOSIS — Z1231 Encounter for screening mammogram for malignant neoplasm of breast: Secondary | ICD-10-CM | POA: Diagnosis not present

## 2020-08-02 DIAGNOSIS — J189 Pneumonia, unspecified organism: Secondary | ICD-10-CM | POA: Diagnosis not present

## 2020-08-02 DIAGNOSIS — I69354 Hemiplegia and hemiparesis following cerebral infarction affecting left non-dominant side: Secondary | ICD-10-CM | POA: Diagnosis not present

## 2020-08-02 DIAGNOSIS — E1122 Type 2 diabetes mellitus with diabetic chronic kidney disease: Secondary | ICD-10-CM | POA: Diagnosis not present

## 2020-08-02 DIAGNOSIS — E1165 Type 2 diabetes mellitus with hyperglycemia: Secondary | ICD-10-CM | POA: Diagnosis not present

## 2020-08-02 DIAGNOSIS — D631 Anemia in chronic kidney disease: Secondary | ICD-10-CM | POA: Diagnosis not present

## 2020-08-02 DIAGNOSIS — I1310 Hypertensive heart and chronic kidney disease without heart failure, with stage 1 through stage 4 chronic kidney disease, or unspecified chronic kidney disease: Secondary | ICD-10-CM | POA: Diagnosis not present

## 2020-08-02 DIAGNOSIS — N390 Urinary tract infection, site not specified: Secondary | ICD-10-CM | POA: Diagnosis not present

## 2020-08-03 DIAGNOSIS — N390 Urinary tract infection, site not specified: Secondary | ICD-10-CM | POA: Diagnosis not present

## 2020-08-03 DIAGNOSIS — N1832 Chronic kidney disease, stage 3b: Secondary | ICD-10-CM | POA: Diagnosis not present

## 2020-08-03 DIAGNOSIS — D631 Anemia in chronic kidney disease: Secondary | ICD-10-CM | POA: Diagnosis not present

## 2020-08-03 DIAGNOSIS — I1310 Hypertensive heart and chronic kidney disease without heart failure, with stage 1 through stage 4 chronic kidney disease, or unspecified chronic kidney disease: Secondary | ICD-10-CM | POA: Diagnosis not present

## 2020-08-03 DIAGNOSIS — A4151 Sepsis due to Escherichia coli [E. coli]: Secondary | ICD-10-CM | POA: Diagnosis not present

## 2020-08-03 DIAGNOSIS — E1122 Type 2 diabetes mellitus with diabetic chronic kidney disease: Secondary | ICD-10-CM | POA: Diagnosis not present

## 2020-08-03 DIAGNOSIS — I69354 Hemiplegia and hemiparesis following cerebral infarction affecting left non-dominant side: Secondary | ICD-10-CM | POA: Diagnosis not present

## 2020-08-03 DIAGNOSIS — J90 Pleural effusion, not elsewhere classified: Secondary | ICD-10-CM | POA: Diagnosis not present

## 2020-08-03 DIAGNOSIS — J189 Pneumonia, unspecified organism: Secondary | ICD-10-CM | POA: Diagnosis not present

## 2020-08-03 DIAGNOSIS — E1165 Type 2 diabetes mellitus with hyperglycemia: Secondary | ICD-10-CM | POA: Diagnosis not present

## 2020-08-04 DIAGNOSIS — J189 Pneumonia, unspecified organism: Secondary | ICD-10-CM | POA: Diagnosis not present

## 2020-08-04 DIAGNOSIS — N1832 Chronic kidney disease, stage 3b: Secondary | ICD-10-CM | POA: Diagnosis not present

## 2020-08-04 DIAGNOSIS — E1165 Type 2 diabetes mellitus with hyperglycemia: Secondary | ICD-10-CM | POA: Diagnosis not present

## 2020-08-04 DIAGNOSIS — I69354 Hemiplegia and hemiparesis following cerebral infarction affecting left non-dominant side: Secondary | ICD-10-CM | POA: Diagnosis not present

## 2020-08-04 DIAGNOSIS — A4151 Sepsis due to Escherichia coli [E. coli]: Secondary | ICD-10-CM | POA: Diagnosis not present

## 2020-08-04 DIAGNOSIS — D631 Anemia in chronic kidney disease: Secondary | ICD-10-CM | POA: Diagnosis not present

## 2020-08-04 DIAGNOSIS — I1310 Hypertensive heart and chronic kidney disease without heart failure, with stage 1 through stage 4 chronic kidney disease, or unspecified chronic kidney disease: Secondary | ICD-10-CM | POA: Diagnosis not present

## 2020-08-04 DIAGNOSIS — J90 Pleural effusion, not elsewhere classified: Secondary | ICD-10-CM | POA: Diagnosis not present

## 2020-08-04 DIAGNOSIS — N390 Urinary tract infection, site not specified: Secondary | ICD-10-CM | POA: Diagnosis not present

## 2020-08-04 DIAGNOSIS — E1122 Type 2 diabetes mellitus with diabetic chronic kidney disease: Secondary | ICD-10-CM | POA: Diagnosis not present

## 2020-08-08 ENCOUNTER — Telehealth: Payer: Self-pay | Admitting: *Deleted

## 2020-08-08 DIAGNOSIS — A4151 Sepsis due to Escherichia coli [E. coli]: Secondary | ICD-10-CM | POA: Diagnosis not present

## 2020-08-08 DIAGNOSIS — J90 Pleural effusion, not elsewhere classified: Secondary | ICD-10-CM | POA: Diagnosis not present

## 2020-08-08 DIAGNOSIS — I69354 Hemiplegia and hemiparesis following cerebral infarction affecting left non-dominant side: Secondary | ICD-10-CM | POA: Diagnosis not present

## 2020-08-08 DIAGNOSIS — E1122 Type 2 diabetes mellitus with diabetic chronic kidney disease: Secondary | ICD-10-CM | POA: Diagnosis not present

## 2020-08-08 DIAGNOSIS — N390 Urinary tract infection, site not specified: Secondary | ICD-10-CM | POA: Diagnosis not present

## 2020-08-08 DIAGNOSIS — D631 Anemia in chronic kidney disease: Secondary | ICD-10-CM | POA: Diagnosis not present

## 2020-08-08 DIAGNOSIS — N1832 Chronic kidney disease, stage 3b: Secondary | ICD-10-CM | POA: Diagnosis not present

## 2020-08-08 DIAGNOSIS — E1165 Type 2 diabetes mellitus with hyperglycemia: Secondary | ICD-10-CM | POA: Diagnosis not present

## 2020-08-08 DIAGNOSIS — J189 Pneumonia, unspecified organism: Secondary | ICD-10-CM | POA: Diagnosis not present

## 2020-08-08 DIAGNOSIS — I1310 Hypertensive heart and chronic kidney disease without heart failure, with stage 1 through stage 4 chronic kidney disease, or unspecified chronic kidney disease: Secondary | ICD-10-CM | POA: Diagnosis not present

## 2020-08-08 DIAGNOSIS — G8190 Hemiplegia, unspecified affecting unspecified side: Secondary | ICD-10-CM | POA: Diagnosis not present

## 2020-08-08 NOTE — Telephone Encounter (Signed)
Elizabeth with [facility] called stating that she saw the patient Friday late. Benjamine Mola stated that she would like an order to collect a urine to do a UA on the patient. Benjamine Mola stated that the caregiver told her Friday that the patient seems to be more confused lately. Benjamine Mola stated that patient has had a history of UTI's that has caused her to end up at the hospital. Benjamine Mola stated that patient does not complain of any symptoms. Benjamine Mola stated that she is going back out to see the patient this week and would like to collect a urine at that time if she can get an order.

## 2020-08-08 NOTE — Addendum Note (Signed)
Addended by: Vanessa Barbara on: 08/08/2020 09:35 AM   Modules accepted: Orders

## 2020-08-08 NOTE — Telephone Encounter (Signed)
Left VM giving Elizabeth VO to do UA/Cx

## 2020-08-08 NOTE — Telephone Encounter (Signed)
Please verbally ok ua and urine culture Thanks

## 2020-08-09 ENCOUNTER — Ambulatory Visit: Payer: Medicare HMO | Admitting: Adult Health

## 2020-08-09 ENCOUNTER — Other Ambulatory Visit: Payer: Self-pay

## 2020-08-09 ENCOUNTER — Encounter: Payer: Self-pay | Admitting: Adult Health

## 2020-08-09 ENCOUNTER — Ambulatory Visit (INDEPENDENT_AMBULATORY_CARE_PROVIDER_SITE_OTHER): Payer: Medicare HMO

## 2020-08-09 VITALS — BP 112/62 | HR 60 | Temp 97.3°F | Ht 62.0 in | Wt 172.0 lb

## 2020-08-09 DIAGNOSIS — N184 Chronic kidney disease, stage 4 (severe): Secondary | ICD-10-CM | POA: Diagnosis not present

## 2020-08-09 DIAGNOSIS — J189 Pneumonia, unspecified organism: Secondary | ICD-10-CM | POA: Diagnosis not present

## 2020-08-09 DIAGNOSIS — J939 Pneumothorax, unspecified: Secondary | ICD-10-CM | POA: Diagnosis not present

## 2020-08-09 DIAGNOSIS — J90 Pleural effusion, not elsewhere classified: Secondary | ICD-10-CM | POA: Diagnosis not present

## 2020-08-09 NOTE — Assessment & Plan Note (Signed)
Continue follow-up with nephrology as planned next month

## 2020-08-09 NOTE — Patient Instructions (Signed)
Continue on current regimen  Follow up with Dr. Halford Chessman  In 6 weeks with Chest xray and As needed

## 2020-08-09 NOTE — Assessment & Plan Note (Signed)
Left lower lobe pneumonia clinically is improving.  Chest x-ray today shows gradual improvement as well.  We will follow-up chest x-ray in 6 weeks.

## 2020-08-09 NOTE — Assessment & Plan Note (Signed)
Left pleural effusion is also improving on chest x-ray.  Patient is clinically improved after thoracentesis.  Patient has decreased size of pleural effusion on chest x-ray today.  Previous culture and cytology were negative.

## 2020-08-09 NOTE — Progress Notes (Signed)
$'@Patient'q$  ID: Renee [name], female    DOB: 1928/11/13, 86 y.o.   MRN: [id number]  No chief complaint on file.   Referring provider: Abner Greenspan, MD  HPI: 86 year old female seen for pulmonary consult during hospitalization October 2021 for pneumonia and left transudate of pleural effusion requiring thoracentesis Medical history significant for diabetes, hyperlipidemia, hypertension, stroke, stage IIIb chronic kidney disease  TEST/EVENTS :   Lt thoracentesis 06/07/20 >> 450 ml, glucose 293, protein < 3, LDH 62, WBC 1550 (51N, 65M, 21L), culture and cytology negative  Chest Imaging:   CT chest 05/21/20 >> small pericardial effusion, atherosclerosis, small/mod Lt pleural effusion, granuloma LUL, small HH  Cardiac Tests:   Echo 06/09/20 >> EF 65 to 70%, grade 1 DD  08/09/2020 Follow up : Pleural Effusion  Patient returns for a 6-week follow-up.  Patient was seen last visit after hospitalization for pneumonia in October.  She was found to have a transudate of left pleural effusion.  She did undergo a thoracentesis with 450 cc of fluid removed.  Cultures and cytology of the pleural fluid were negative.  She was started on Lasix 10 mg.  And was given some antibiotics with doxycycline for ongoing cough and congestion.  Chest x-ray last visit showed no significant change in a moderate left pleural effusion and airspace opacities.  Since last visit patient says she is feeling better, feels she has made a lot of progress this past month .  She has decreased shortness of breath.  Has had decreased lower extremity swelling.  Remains on Lasix 10 mg daily.  Overall feels her strength has improved some. She was seen by ENT about a month ago for an acute ear infection.  She finished antibiotics and says that is also helped. Chest x-ray today shows decreased left-sided pneumonia and pleural effusion.  There is a very small residual left pneumothorax with decreased patchy consolidation at the left  lateral lung base.    Allergies  Allergen Reactions  . Diclofenac Sodium Other (See Comments)    REACTION: angioedema  . Pioglitazone Swelling    Immunization History  Administered Date(s) Administered  . Fluad Quad(high Dose 65+) 06/02/2019, 05/13/2020  . Influenza Split 05/21/2011, 06/11/2012  . Influenza Whole 05/26/2004, 05/23/2010  . Influenza,inj,Quad PF,6+ Mos 04/17/2013, 04/27/2014, 05/20/2015, 05/22/2016, 05/22/2017, 05/22/2018  . PFIZER SARS-COV-2 Vaccination 10/16/2019, 11/17/2019  . Pneumococcal Conjugate-13 04/27/2014  . Pneumococcal Polysaccharide-23 09/26/2005  . Td 05/21/1999, 04/04/2012  . Zoster Recombinat (Shingrix) 01/06/2020, 03/15/2020    Past Medical History:  Diagnosis Date  . Angioedema    possibly from voltaren  . Bronchopneumonia 12/11/2016  . Degenerative disc disease   . Diabetes mellitus    type II  . Hyperlipidemia   . Hypertension   . LVH (left ventricular hypertrophy)    and atrial enlargement by echo in past with nl EF  . Nasal pruritis   . Osteoarthritis   . Osteopenia   . Renal insufficiency   . Sleep apnea   . Stroke [facility]) 05/2010   Small vessel sobcortical (in [place] trial) with Dr Leonie Man, residual L hemiparesis  . Vitamin B 12 deficiency 04/08    Tobacco History: Social History   Tobacco Use  Smoking Status Never Smoker  Smokeless Tobacco Never Used   Counseling given: Not Answered   Outpatient Medications Prior to Visit  Medication Sig Dispense Refill  . acetaminophen (TYLENOL) 325 MG tablet Take 650 mg by mouth at bedtime.     Marland Kitchen albuterol (VENTOLIN HFA)  108 (90 Base) MCG/ACT inhaler Inhale 2 puffs into the lungs every 4 (four) hours as needed for wheezing or shortness of breath. 18 g 3  . ALPRAZolam (XANAX) 0.5 MG tablet TAKE 1/2 TO 1 TABLET BY MOUTH TWICE A DAY AS NEEDED FOR ANXIETY (Patient taking differently: Take 0.25-0.5 mg by mouth 2 (two) times daily as needed for anxiety. TAKE 1/2 TO 1 TABLET BY MOUTH TWICE A  DAY AS NEEDED FOR ANXIETY) 60 tablet 2  . amLODipine (NORVASC) 10 MG tablet Take 1 tablet (10 mg total) by mouth daily. 90 tablet 3  . amoxicillin-clavulanate (AUGMENTIN) 875-125 MG tablet Take 1 tablet by mouth 2 (two) times daily. 20 tablet 0  . aspirin 325 MG tablet Take 325 mg by mouth daily.    . Blood Glucose Monitoring Suppl (ONE TOUCH ULTRA 2) w/Device KIT Check blood sugar once daily and as directed. Dx E11.9 1 each 0  . calcium-vitamin D (OSCAL WITH D) 500-200 MG-UNIT per tablet Take 1 tablet by mouth daily.    . Cholecalciferol (VITAMIN D) 1000 UNITS capsule Take 1,000 Units by mouth daily.    . Cranberry 500 MG TABS Take 500 mg by mouth in the morning and at bedtime.    . docusate sodium (COLACE) 100 MG capsule Take 200 mg by mouth at bedtime.    Marland Kitchen doxycycline (VIBRA-TABS) 100 MG tablet Take 1 tablet (100 mg total) by mouth 2 (two) times daily. 14 tablet 0  . ferrous sulfate 325 (65 FE) MG tablet Take 325 mg by mouth 2 (two) times daily.    . furosemide (LASIX) 20 MG tablet TAKE 1/2 TABLET BY MOUTH DAILY 30 tablet 0  . gemfibrozil (LOPID) 600 MG tablet TAKE 1 TABLET BY MOUTH EVERY DAY 90 tablet 1  . glipiZIDE (GLUCOTROL XL) 2.5 MG 24 hr tablet TAKE 1 TABLET BY MOUTH EVERY DAY WITH BREAKFAST (Patient taking differently: Take 5 mg by mouth daily with breakfast.) 90 tablet 3  . guaiFENesin-dextromethorphan (ROBITUSSIN DM) 100-10 MG/5ML syrup Take 5 mLs by mouth every 4 (four) hours as needed for cough. 118 mL 0  . hydrALAZINE (APRESOLINE) 100 MG tablet Take 100 mg by mouth 3 (three) times daily.     . metoprolol succinate (TOPROL-XL) 25 MG 24 hr tablet Take 0.5 tablets (12.5 mg total) by mouth 2 (two) times daily. 90 tablet 3  . mirtazapine (REMERON) 15 MG tablet TAKE 1/2 TABLET BY MOUTH AT BEDTIME 45 tablet 3  . Multiple Vitamin (MULTIVITAMIN) capsule Take 1 capsule by mouth daily.    . Multiple Vitamins-Minerals (PRESERVISION AREDS 2+MULTI VIT) CAPS Take 1 capsule by mouth in the  morning and at bedtime.    . nitrofurantoin, macrocrystal-monohydrate, (MACROBID) 100 MG capsule Take 1 capsule (100 mg total) by mouth 2 (two) times daily. 14 capsule 0  . Olopatadine HCl (PATADAY OP) Place 1 drop into both eyes daily.    Glory Rosebush Delica Lancets 72Z MISC CHECK BLOOD SUGAR ONCE DAILY AND AS DIRECTED. DX E11.9 (Patient taking differently: 1 Device by Other route daily.) 100 each 2  . ONETOUCH ULTRA test strip CHECK BLOOD SUGAR ONCE DAILY AND AS DIRECTED. DX E11.9 100 strip 1  . sertraline (ZOLOFT) 50 MG tablet TAKE 1 TABLET BY MOUTH EVERY DAY 90 tablet 1  . trandolapril (MAVIK) 4 MG tablet Take 1 tablet (4 mg total) by mouth daily. 90 tablet 3  . triamcinolone (NASACORT) 55 MCG/ACT AERO nasal inhaler Place 2 sprays into the nose daily. (Patient taking differently:  Place 2 sprays into the nose at bedtime.) 1 each 5  . vitamin B-12 (CYANOCOBALAMIN) 1000 MCG tablet Take 1,000 mcg by mouth daily.      No facility-administered medications prior to visit.     Review of Systems:   Constitutional:   No  weight loss, night sweats,  Fevers, chills,  +fatigue, or  lassitude.  HEENT:   No headaches,  Difficulty swallowing,  Tooth/dental problems, or  Sore throat,                No sneezing, itching, ear ache, nasal congestion, post nasal drip,   CV:  No chest pain,  Orthopnea, PND, swelling in lower extremities, anasarca, dizziness, palpitations, syncope.   GI  No heartburn, indigestion, abdominal pain, nausea, vomiting, diarrhea, change in bowel habits, loss of appetite, bloody stools.   Resp:    No chest wall deformity  Skin: no rash or lesions.  GU: no dysuria, change in color of urine, no urgency or frequency.  No flank pain, no hematuria   MS:  No joint pain or swelling.  No decreased range of motion.  No back pain.    Physical Exam  BP 112/62 (BP Location: Left Arm, Cuff Size: Normal)   Pulse 60   Temp (!) 97.3 F (36.3 C) (Oral)   Ht 5' 2"  (1.575 m)   Wt 172 lb  (78 kg)   SpO2 98%   BMI 31.46 kg/m   GEN: A/Ox3; pleasant , NAD, well nourished , elderly in wheelchair    HEENT:  [place]/AT,    NOSE-clear, THROAT-clear, no lesions, no postnasal drip or exudate noted. Hearing aides bilaterally   NECK:  Supple w/ fair ROM; no JVD; normal carotid impulses w/o bruits; no thyromegaly or nodules palpated; no lymphadenopathy.    RESP  Clear  P & A; w/o, wheezes/ rales/ or rhonchi. no accessory muscle use, no dullness to percussion  CARD:  RRR, no m/r/g, tr peripheral edema, pulses intact, no cyanosis or clubbing.  GI:   Soft & nt; nml bowel sounds; no organomegaly or masses detected.   Musco: Warm bil, no deformities or joint swelling noted.   Neuro: alert, no focal deficits noted. ,  Chronic left-sided weakness  Skin: Warm, no lesions or rashes    Lab Results:  BMET    ProBNP No results found for: PROBNP  Imaging: DG Chest 2 View  Result Date: 08/09/2020 CLINICAL DATA:  Left lower lobe pneumonia EXAM: CHEST - 2 VIEW COMPARISON:  06/30/2020 FINDINGS: Frontal and lateral views of the chest demonstrate an unremarkable cardiac silhouette. There is a small residual left pneumothorax, with minimal patchy consolidation at the left lateral lung base. Significant improvement since prior study. No pneumothorax. Right chest is clear. No acute bony abnormalities. IMPRESSION: 1. Minimal residual left basilar consolidation and effusion consistent with resolving pneumonia. Please note that it may take 6-8 weeks for complete radiographic resolution of pneumonia after appropriate medical management. Continued follow-up is recommended to document resolution. Electronically Signed   By: Randa Ngo M.D.   On: 08/09/2020 15:02      No flowsheet data found.  No results found for: NITRICOXIDE      Assessment & Plan:   Community acquired pneumonia of left lower lobe of lung Left lower lobe pneumonia clinically is improving.  Chest x-ray today shows  gradual improvement as well.  We will follow-up chest x-ray in 6 weeks.  Pleural effusion Left pleural effusion is also improving on chest x-ray.  Patient is clinically improved after thoracentesis.  Patient has decreased size of pleural effusion on chest x-ray today.  Previous culture and cytology were negative.  CKD (chronic kidney disease) stage 4, GFR 15-29 ml/min (HCC) Continue follow-up with nephrology as planned next month  Pneumothorax Very small residual left lateral pneumothorax-patient is stable with no clinical symptoms.  We will continue to follow and repeat chest x-ray on return visit.     Rexene Edison, NP 08/09/2020

## 2020-08-09 NOTE — Progress Notes (Signed)
Reviewed and agree with assessment/plan.   Chesley Mires, MD [facility] Pulmonary/Critical Care 08/09/2020, 3:30 PM Pager:  [phone]

## 2020-08-09 NOTE — Assessment & Plan Note (Signed)
Very small residual left lateral pneumothorax-patient is stable with no clinical symptoms.  We will continue to follow and repeat chest x-ray on return visit.

## 2020-08-10 ENCOUNTER — Other Ambulatory Visit: Payer: Self-pay | Admitting: Pulmonary Disease

## 2020-08-10 ENCOUNTER — Other Ambulatory Visit: Payer: Self-pay | Admitting: Family Medicine

## 2020-08-10 ENCOUNTER — Telehealth: Payer: Self-pay

## 2020-08-10 DIAGNOSIS — N390 Urinary tract infection, site not specified: Secondary | ICD-10-CM

## 2020-08-10 DIAGNOSIS — B9620 Unspecified Escherichia coli [E. coli] as the cause of diseases classified elsewhere: Secondary | ICD-10-CM

## 2020-08-10 MED ORDER — NITROFURANTOIN MONOHYD MACRO 100 MG PO CAPS: 100.0000 mg | ORAL_CAPSULE | Freq: Two times a day (BID) | ORAL | 0 refills | Status: DC

## 2020-08-10 NOTE — Telephone Encounter (Signed)
Renee [name] and clinical mgr with [facility]  Left v/m wanting to know if Dr Glori Bickers had gotten any preliminary U/A or C&S results. Benjamine Mola got preliminary C&S that read gram negative rods > 100,000 colony forming units. Elizabeth request cb.

## 2020-08-10 NOTE — Telephone Encounter (Signed)
Pt called back, I advise her of UA results and Dr. Marliss Coots comments. Pt will start abx and await our call back with final report

## 2020-08-10 NOTE — Telephone Encounter (Signed)
Received fax, it's pt's UA, and the prelim on culture but no sensitivities listed, in inbox for review

## 2020-08-10 NOTE — Telephone Encounter (Signed)
I pended macrobid to send to pharmacy of choice  This is what she took last time Unsure if we will get sensitivities before the holiday  Go ahead and take this while we wait for that   If symptoms become severe please alert [place]

## 2020-08-10 NOTE — Telephone Encounter (Signed)
Left VM requesting pt to call the office back    Currie Paris, RN and advise her of Dr. Marliss Coots comments and Rx was sent to [place], RN will also try and call pt too

## 2020-08-10 NOTE — Telephone Encounter (Signed)
Name of Medication:xanax Name of Pharmacy:[facility] [place] or Written Date and Quantity:05/16/20 #60 tabs 2 refills Last Office Visit and Type:06/15/20 ER F/U Next Office Visit and Type:none scheduled

## 2020-08-11 DIAGNOSIS — N1832 Chronic kidney disease, stage 3b: Secondary | ICD-10-CM | POA: Diagnosis not present

## 2020-08-11 DIAGNOSIS — E1122 Type 2 diabetes mellitus with diabetic chronic kidney disease: Secondary | ICD-10-CM | POA: Diagnosis not present

## 2020-08-11 DIAGNOSIS — J189 Pneumonia, unspecified organism: Secondary | ICD-10-CM | POA: Diagnosis not present

## 2020-08-11 DIAGNOSIS — I69354 Hemiplegia and hemiparesis following cerebral infarction affecting left non-dominant side: Secondary | ICD-10-CM | POA: Diagnosis not present

## 2020-08-11 DIAGNOSIS — D631 Anemia in chronic kidney disease: Secondary | ICD-10-CM | POA: Diagnosis not present

## 2020-08-11 DIAGNOSIS — J90 Pleural effusion, not elsewhere classified: Secondary | ICD-10-CM | POA: Diagnosis not present

## 2020-08-11 DIAGNOSIS — A4151 Sepsis due to Escherichia coli [E. coli]: Secondary | ICD-10-CM | POA: Diagnosis not present

## 2020-08-11 DIAGNOSIS — N390 Urinary tract infection, site not specified: Secondary | ICD-10-CM | POA: Diagnosis not present

## 2020-08-11 DIAGNOSIS — I1310 Hypertensive heart and chronic kidney disease without heart failure, with stage 1 through stage 4 chronic kidney disease, or unspecified chronic kidney disease: Secondary | ICD-10-CM | POA: Diagnosis not present

## 2020-08-11 DIAGNOSIS — E1165 Type 2 diabetes mellitus with hyperglycemia: Secondary | ICD-10-CM | POA: Diagnosis not present

## 2020-08-11 NOTE — Telephone Encounter (Signed)
Refilled a little early due to the holiday

## 2020-08-19 DIAGNOSIS — I1310 Hypertensive heart and chronic kidney disease without heart failure, with stage 1 through stage 4 chronic kidney disease, or unspecified chronic kidney disease: Secondary | ICD-10-CM | POA: Diagnosis not present

## 2020-08-19 DIAGNOSIS — D631 Anemia in chronic kidney disease: Secondary | ICD-10-CM | POA: Diagnosis not present

## 2020-08-19 DIAGNOSIS — N390 Urinary tract infection, site not specified: Secondary | ICD-10-CM | POA: Diagnosis not present

## 2020-08-19 DIAGNOSIS — E1165 Type 2 diabetes mellitus with hyperglycemia: Secondary | ICD-10-CM | POA: Diagnosis not present

## 2020-08-19 DIAGNOSIS — R69 Illness, unspecified: Secondary | ICD-10-CM | POA: Diagnosis not present

## 2020-08-19 DIAGNOSIS — E1122 Type 2 diabetes mellitus with diabetic chronic kidney disease: Secondary | ICD-10-CM | POA: Diagnosis not present

## 2020-08-19 DIAGNOSIS — E785 Hyperlipidemia, unspecified: Secondary | ICD-10-CM | POA: Diagnosis not present

## 2020-08-19 DIAGNOSIS — I69354 Hemiplegia and hemiparesis following cerebral infarction affecting left non-dominant side: Secondary | ICD-10-CM | POA: Diagnosis not present

## 2020-08-19 DIAGNOSIS — N1832 Chronic kidney disease, stage 3b: Secondary | ICD-10-CM | POA: Diagnosis not present

## 2020-08-23 DIAGNOSIS — N1832 Chronic kidney disease, stage 3b: Secondary | ICD-10-CM | POA: Diagnosis not present

## 2020-08-23 DIAGNOSIS — N2581 Secondary hyperparathyroidism of renal origin: Secondary | ICD-10-CM | POA: Diagnosis not present

## 2020-08-23 DIAGNOSIS — R600 Localized edema: Secondary | ICD-10-CM | POA: Diagnosis not present

## 2020-08-23 DIAGNOSIS — I10 Essential (primary) hypertension: Secondary | ICD-10-CM | POA: Diagnosis not present

## 2020-08-24 ENCOUNTER — Other Ambulatory Visit: Payer: Self-pay | Admitting: Family Medicine

## 2020-08-26 DIAGNOSIS — E1122 Type 2 diabetes mellitus with diabetic chronic kidney disease: Secondary | ICD-10-CM | POA: Diagnosis not present

## 2020-08-26 DIAGNOSIS — I69354 Hemiplegia and hemiparesis following cerebral infarction affecting left non-dominant side: Secondary | ICD-10-CM | POA: Diagnosis not present

## 2020-08-26 DIAGNOSIS — E1165 Type 2 diabetes mellitus with hyperglycemia: Secondary | ICD-10-CM | POA: Diagnosis not present

## 2020-08-26 DIAGNOSIS — D631 Anemia in chronic kidney disease: Secondary | ICD-10-CM | POA: Diagnosis not present

## 2020-08-26 DIAGNOSIS — N390 Urinary tract infection, site not specified: Secondary | ICD-10-CM | POA: Diagnosis not present

## 2020-08-26 DIAGNOSIS — N1832 Chronic kidney disease, stage 3b: Secondary | ICD-10-CM | POA: Diagnosis not present

## 2020-08-26 DIAGNOSIS — E785 Hyperlipidemia, unspecified: Secondary | ICD-10-CM | POA: Diagnosis not present

## 2020-08-26 DIAGNOSIS — I1310 Hypertensive heart and chronic kidney disease without heart failure, with stage 1 through stage 4 chronic kidney disease, or unspecified chronic kidney disease: Secondary | ICD-10-CM | POA: Diagnosis not present

## 2020-08-26 DIAGNOSIS — R69 Illness, unspecified: Secondary | ICD-10-CM | POA: Diagnosis not present

## 2020-08-29 DIAGNOSIS — I1310 Hypertensive heart and chronic kidney disease without heart failure, with stage 1 through stage 4 chronic kidney disease, or unspecified chronic kidney disease: Secondary | ICD-10-CM | POA: Diagnosis not present

## 2020-08-29 DIAGNOSIS — R69 Illness, unspecified: Secondary | ICD-10-CM | POA: Diagnosis not present

## 2020-08-29 DIAGNOSIS — E1165 Type 2 diabetes mellitus with hyperglycemia: Secondary | ICD-10-CM | POA: Diagnosis not present

## 2020-08-29 DIAGNOSIS — N390 Urinary tract infection, site not specified: Secondary | ICD-10-CM | POA: Diagnosis not present

## 2020-08-29 DIAGNOSIS — E1122 Type 2 diabetes mellitus with diabetic chronic kidney disease: Secondary | ICD-10-CM | POA: Diagnosis not present

## 2020-08-29 DIAGNOSIS — E785 Hyperlipidemia, unspecified: Secondary | ICD-10-CM | POA: Diagnosis not present

## 2020-08-29 DIAGNOSIS — D631 Anemia in chronic kidney disease: Secondary | ICD-10-CM | POA: Diagnosis not present

## 2020-08-29 DIAGNOSIS — I69354 Hemiplegia and hemiparesis following cerebral infarction affecting left non-dominant side: Secondary | ICD-10-CM | POA: Diagnosis not present

## 2020-08-29 DIAGNOSIS — N1832 Chronic kidney disease, stage 3b: Secondary | ICD-10-CM | POA: Diagnosis not present

## 2020-08-30 ENCOUNTER — Telehealth: Payer: Self-pay | Admitting: *Deleted

## 2020-08-30 ENCOUNTER — Other Ambulatory Visit: Payer: Medicare HMO

## 2020-08-30 DIAGNOSIS — I69354 Hemiplegia and hemiparesis following cerebral infarction affecting left non-dominant side: Secondary | ICD-10-CM | POA: Diagnosis not present

## 2020-08-30 DIAGNOSIS — E1165 Type 2 diabetes mellitus with hyperglycemia: Secondary | ICD-10-CM | POA: Diagnosis not present

## 2020-08-30 DIAGNOSIS — D631 Anemia in chronic kidney disease: Secondary | ICD-10-CM | POA: Diagnosis not present

## 2020-08-30 DIAGNOSIS — N390 Urinary tract infection, site not specified: Secondary | ICD-10-CM | POA: Diagnosis not present

## 2020-08-30 DIAGNOSIS — N1832 Chronic kidney disease, stage 3b: Secondary | ICD-10-CM | POA: Diagnosis not present

## 2020-08-30 DIAGNOSIS — E1122 Type 2 diabetes mellitus with diabetic chronic kidney disease: Secondary | ICD-10-CM | POA: Diagnosis not present

## 2020-08-30 DIAGNOSIS — R69 Illness, unspecified: Secondary | ICD-10-CM | POA: Diagnosis not present

## 2020-08-30 DIAGNOSIS — E785 Hyperlipidemia, unspecified: Secondary | ICD-10-CM | POA: Diagnosis not present

## 2020-08-30 DIAGNOSIS — I1310 Hypertensive heart and chronic kidney disease without heart failure, with stage 1 through stage 4 chronic kidney disease, or unspecified chronic kidney disease: Secondary | ICD-10-CM | POA: Diagnosis not present

## 2020-08-30 NOTE — Telephone Encounter (Signed)
Before returning to prophylactic therapy we need to know if she has a uti now (can they drop off a sample?)

## 2020-08-30 NOTE — Telephone Encounter (Signed)
Angie called back stating that patient today complained of pain with urination. Angie stated on patient's discharge summary from her last hospital stay it was noted that patient may need to be put on suppressive medication to prevent future UTI's. Angie stated that now back in December that would have been her 5th UTI's.

## 2020-08-30 NOTE — Telephone Encounter (Signed)
Angie notified of Dr. Marliss Coots comments. Called pt and also advise her of Dr. Marliss Coots comments and pt will drop off urine sample today to check

## 2020-08-30 NOTE — Telephone Encounter (Signed)
[facility] nurse left a voicemail stating that she is trying to reconcile the patient's medications. Angie stated that patient was suppose to be on Macrobid bid prophylaxis to prevent her from getting UTI's. Angie stated that they picked the patient up in October. Angie stated that the last time the Macrobid was filled was 08/10/20. Angie stated that patient is now complaining of UTI symptoms again.Janace Hoard wants to know if Macrobid can be sent in and patient continue to stay on it to prevent UTI's so patient does not have to continue to go thru this. Pharmacy: [facility] Left message on voicemail for Angie to call back to get more information.

## 2020-08-31 ENCOUNTER — Other Ambulatory Visit (INDEPENDENT_AMBULATORY_CARE_PROVIDER_SITE_OTHER): Payer: Medicare HMO

## 2020-08-31 ENCOUNTER — Other Ambulatory Visit: Payer: Medicare HMO

## 2020-08-31 ENCOUNTER — Telehealth: Payer: Self-pay | Admitting: *Deleted

## 2020-08-31 ENCOUNTER — Other Ambulatory Visit: Payer: Self-pay

## 2020-08-31 DIAGNOSIS — R8290 Unspecified abnormal findings in urine: Secondary | ICD-10-CM | POA: Diagnosis not present

## 2020-08-31 DIAGNOSIS — R410 Disorientation, unspecified: Secondary | ICD-10-CM

## 2020-08-31 LAB — POC URINALSYSI DIPSTICK (AUTOMATED)
Bilirubin, UA: NEGATIVE
Blood, UA: NEGATIVE
Glucose, UA: NEGATIVE
Ketones, UA: NEGATIVE
Nitrite, UA: NEGATIVE
Protein, UA: POSITIVE — AB
Spec Grav, UA: >=1.03 — AB (ref 1.010–1.025)
Urobilinogen, UA: 0.2 E.U./dL
pH, UA: 6 (ref 5.0–8.0)

## 2020-08-31 NOTE — Telephone Encounter (Signed)
-----   Message from Abner Greenspan, MD sent at 08/31/2020  2:45 PM EST ----- Unsure if she has a uti or not-please send for culture Her urine is concentrated and that can cause symptoms of burning to urinate/bladder discomfort  If the has a uti we will treat that and then transition to macrobid for prophylaxis (that will be once daily)  Let's see how culture results are first  Let me know how symptoms are after drinking a lot of water (can update me tomorrow)

## 2020-08-31 NOTE — Telephone Encounter (Signed)
Left VM requesting pt to call the office back 

## 2020-09-01 LAB — URINE CULTURE
MICRO NUMBER:: 11409988
SPECIMEN QUALITY:: ADEQUATE

## 2020-09-01 NOTE — Telephone Encounter (Signed)
Called pt and no answer and no VM (kept ringing) 

## 2020-09-01 NOTE — Telephone Encounter (Signed)
Patient called in returning your call . Please call patient back. EM

## 2020-09-02 ENCOUNTER — Emergency Department (HOSPITAL_COMMUNITY)
Admission: EM | Admit: 2020-09-02 | Discharge: 2020-09-03 | Disposition: A | Discharge status: Home / Self Care | Payer: Medicare HMO | Attending: Emergency Medicine | Admitting: Emergency Medicine

## 2020-09-02 ENCOUNTER — Emergency Department (HOSPITAL_COMMUNITY): Payer: Medicare HMO

## 2020-09-02 DIAGNOSIS — R4182 Altered mental status, unspecified: Secondary | ICD-10-CM | POA: Insufficient documentation

## 2020-09-02 DIAGNOSIS — I491 Atrial premature depolarization: Secondary | ICD-10-CM | POA: Diagnosis not present

## 2020-09-02 DIAGNOSIS — I129 Hypertensive chronic kidney disease with stage 1 through stage 4 chronic kidney disease, or unspecified chronic kidney disease: Secondary | ICD-10-CM | POA: Insufficient documentation

## 2020-09-02 DIAGNOSIS — N184 Chronic kidney disease, stage 4 (severe): Secondary | ICD-10-CM | POA: Diagnosis not present

## 2020-09-02 DIAGNOSIS — I1310 Hypertensive heart and chronic kidney disease without heart failure, with stage 1 through stage 4 chronic kidney disease, or unspecified chronic kidney disease: Secondary | ICD-10-CM | POA: Diagnosis not present

## 2020-09-02 DIAGNOSIS — Z20822 Contact with and (suspected) exposure to covid-19: Secondary | ICD-10-CM | POA: Insufficient documentation

## 2020-09-02 DIAGNOSIS — Z7984 Long term (current) use of oral hypoglycemic drugs: Secondary | ICD-10-CM | POA: Insufficient documentation

## 2020-09-02 DIAGNOSIS — H8110 Benign paroxysmal vertigo, unspecified ear: Secondary | ICD-10-CM | POA: Diagnosis not present

## 2020-09-02 DIAGNOSIS — E1165 Type 2 diabetes mellitus with hyperglycemia: Secondary | ICD-10-CM | POA: Diagnosis not present

## 2020-09-02 DIAGNOSIS — I69354 Hemiplegia and hemiparesis following cerebral infarction affecting left non-dominant side: Secondary | ICD-10-CM | POA: Diagnosis not present

## 2020-09-02 DIAGNOSIS — H8112 Benign paroxysmal vertigo, left ear: Secondary | ICD-10-CM | POA: Diagnosis not present

## 2020-09-02 DIAGNOSIS — I10 Essential (primary) hypertension: Secondary | ICD-10-CM | POA: Diagnosis not present

## 2020-09-02 DIAGNOSIS — E1122 Type 2 diabetes mellitus with diabetic chronic kidney disease: Secondary | ICD-10-CM | POA: Insufficient documentation

## 2020-09-02 DIAGNOSIS — D631 Anemia in chronic kidney disease: Secondary | ICD-10-CM | POA: Diagnosis not present

## 2020-09-02 DIAGNOSIS — R531 Weakness: Secondary | ICD-10-CM | POA: Diagnosis not present

## 2020-09-02 DIAGNOSIS — N1832 Chronic kidney disease, stage 3b: Secondary | ICD-10-CM | POA: Diagnosis not present

## 2020-09-02 DIAGNOSIS — N390 Urinary tract infection, site not specified: Secondary | ICD-10-CM | POA: Diagnosis not present

## 2020-09-02 DIAGNOSIS — R29810 Facial weakness: Secondary | ICD-10-CM | POA: Diagnosis not present

## 2020-09-02 DIAGNOSIS — Z79899 Other long term (current) drug therapy: Secondary | ICD-10-CM | POA: Insufficient documentation

## 2020-09-02 DIAGNOSIS — Z743 Need for continuous supervision: Secondary | ICD-10-CM | POA: Diagnosis not present

## 2020-09-02 DIAGNOSIS — E1159 Type 2 diabetes mellitus with other circulatory complications: Secondary | ICD-10-CM | POA: Insufficient documentation

## 2020-09-02 DIAGNOSIS — Z7982 Long term (current) use of aspirin: Secondary | ICD-10-CM | POA: Diagnosis not present

## 2020-09-02 DIAGNOSIS — R69 Illness, unspecified: Secondary | ICD-10-CM | POA: Diagnosis not present

## 2020-09-02 DIAGNOSIS — E785 Hyperlipidemia, unspecified: Secondary | ICD-10-CM | POA: Diagnosis not present

## 2020-09-02 LAB — PROTIME-INR
INR: 1 (ref 0.8–1.2)
Prothrombin Time: 12.8 seconds (ref 11.4–15.2)

## 2020-09-02 LAB — URINALYSIS, ROUTINE W REFLEX MICROSCOPIC
Bilirubin Urine: NEGATIVE
Glucose, UA: >=500 mg/dL — AB
Hgb urine dipstick: NEGATIVE
Ketones, ur: NEGATIVE mg/dL
Nitrite: NEGATIVE
Protein, ur: NEGATIVE mg/dL
Specific Gravity, Urine: 1.014 (ref 1.005–1.030)
pH: 5 (ref 5.0–8.0)

## 2020-09-02 LAB — COMPREHENSIVE METABOLIC PANEL
ALT: 15 U/L (ref 0–44)
AST: 17 U/L (ref 15–41)
Albumin: 3.4 g/dL — ABNORMAL LOW (ref 3.5–5.0)
Alkaline Phosphatase: 79 U/L (ref 38–126)
Anion gap: 7 (ref 5–15)
BUN: 29 mg/dL — ABNORMAL HIGH (ref 8–23)
CO2: 24 mmol/L (ref 22–32)
Calcium: 9 mg/dL (ref 8.9–10.3)
Chloride: 103 mmol/L (ref 98–111)
Creatinine, Ser: 1.64 mg/dL — ABNORMAL HIGH (ref 0.44–1.00)
GFR, Estimated: 30 mL/min — ABNORMAL LOW (ref >60)
Glucose, Bld: 396 mg/dL — ABNORMAL HIGH (ref 70–99)
Potassium: 4.8 mmol/L (ref 3.5–5.1)
Sodium: 134 mmol/L — ABNORMAL LOW (ref 135–145)
Total Bilirubin: 0.5 mg/dL (ref 0.3–1.2)
Total Protein: 5.5 g/dL — ABNORMAL LOW (ref 6.5–8.1)

## 2020-09-02 LAB — I-STAT CHEM 8, ED
BUN: 30 mg/dL — ABNORMAL HIGH (ref 8–23)
Calcium, Ion: 1.26 mmol/L (ref 1.15–1.40)
Chloride: 103 mmol/L (ref 98–111)
Creatinine, Ser: 1.6 mg/dL — ABNORMAL HIGH (ref 0.44–1.00)
Glucose, Bld: 365 mg/dL — ABNORMAL HIGH (ref 70–99)
HCT: 37 % (ref 36.0–46.0)
Hemoglobin: 12.6 g/dL (ref 12.0–15.0)
Potassium: 4.8 mmol/L (ref 3.5–5.1)
Sodium: 137 mmol/L (ref 135–145)
TCO2: 24 mmol/L (ref 22–32)

## 2020-09-02 LAB — CBC
HCT: 38.8 % (ref 36.0–46.0)
Hemoglobin: 12.2 g/dL (ref 12.0–15.0)
MCH: 28.7 pg (ref 26.0–34.0)
MCHC: 31.4 g/dL (ref 30.0–36.0)
MCV: 91.3 fL (ref 80.0–100.0)
Platelets: 182 10*3/uL (ref 150–400)
RBC: 4.25 MIL/uL (ref 3.87–5.11)
RDW: 14.1 % (ref 11.5–15.5)
WBC: 4.9 10*3/uL (ref 4.0–10.5)
nRBC: 0 % (ref 0.0–0.2)

## 2020-09-02 LAB — RESP PANEL BY RT-PCR (FLU A&B, COVID) ARPGX2
Influenza A by PCR: NEGATIVE
Influenza B by PCR: NEGATIVE
SARS Coronavirus 2 by RT PCR: NEGATIVE

## 2020-09-02 LAB — APTT: aPTT: 29 seconds (ref 24–36)

## 2020-09-02 LAB — DIFFERENTIAL
Abs Immature Granulocytes: 0.03 10*3/uL (ref 0.00–0.07)
Basophils Absolute: 0 10*3/uL (ref 0.0–0.1)
Basophils Relative: 1 %
Eosinophils Absolute: 0.1 10*3/uL (ref 0.0–0.5)
Eosinophils Relative: 2 %
Immature Granulocytes: 1 %
Lymphocytes Relative: 19 %
Lymphs Abs: 0.9 10*3/uL (ref 0.7–4.0)
Monocytes Absolute: 0.4 10*3/uL (ref 0.1–1.0)
Monocytes Relative: 8 %
Neutro Abs: 3.4 10*3/uL (ref 1.7–7.7)
Neutrophils Relative %: 69 %

## 2020-09-02 LAB — CBG MONITORING, ED: Glucose-Capillary: 400 mg/dL — ABNORMAL HIGH (ref 70–99)

## 2020-09-02 LAB — ETHANOL: Alcohol, Ethyl (B): <10 mg/dL (ref <10)

## 2020-09-02 MED ORDER — MECLIZINE HCL 25 MG PO TABS: 25.0000 mg | ORAL_TABLET | Freq: Three times a day (TID) | ORAL | 0 refills | Status: DC | PRN

## 2020-09-02 MED ORDER — MECLIZINE HCL 25 MG PO TABS
25.0000 mg | ORAL_TABLET | Freq: Once | ORAL | Status: AC
  Administered 2020-09-02: 25 mg via ORAL
  Filled 2020-09-02: qty 1

## 2020-09-02 NOTE — ED Provider Notes (Signed)
[facility] EMERGENCY DEPARTMENT Provider Note   CSN: [id number] Arrival date & time: 09/02/20  1844     History Chief Complaint  Patient presents with  . Altered Mental Status    Renee [name] is a 87 y.o. female.  HPI Patient reports that she awakened this morning and felt very dizzy.  She reports she woke up tried to sit up and everything was spinning.  She decided to try to rest little longer and see if would improve.  She reports she went back to sleep for a little while and then got up again but it was still the same.  She denies she had any headache.  She did not perceive double vision or loss of vision.  Patient denies that she had any vomiting.  Patient denies that she perceives weakness of her left or right side of the body.  She reports she does feel like she is generally weak and not feeling well.  She is wondering if she might have a urinary tract infection.    Past Medical History:  Diagnosis Date  . Angioedema    possibly from voltaren  . Bronchopneumonia 12/11/2016  . Degenerative disc disease   . Diabetes mellitus    type II  . Hyperlipidemia   . Hypertension   . LVH (left ventricular hypertrophy)    and atrial enlargement by echo in past with nl EF  . Nasal pruritis   . Osteoarthritis   . Osteopenia   . Renal insufficiency   . Sleep apnea   . Stroke [facility]) 05/2010   Small vessel sobcortical (in [place] trial) with Dr Leonie Man, residual L hemiparesis  . Vitamin B 12 deficiency 04/08    Patient Active Problem List   Diagnosis Date Noted  . Pneumothorax 08/09/2020  . Pleural effusion 05/13/2020  . Frequent UTI 05/13/2020  . Dizziness 02/12/2020  . Normocytic anemia 02/12/2020  . Fall involving sidewalk curb 11/29/2019  . Contusion of back 11/27/2019  . Hyperlipidemia associated with type 2 diabetes mellitus ([place])   . Aortic atherosclerosis ([place]) 07/13/2019  . H/O sepsis 07/01/2019  . CKD (chronic kidney disease) stage 4, GFR 15-29  ml/min (H[facility]) 07/01/2019  . Lower abdominal pain 05/11/2019  . Routine general medical examination at a health care facility 03/30/2019  . External hemorrhoid 01/17/2018  . History of colitis 01/17/2018  . Generalized weakness 12/24/2016  . Diabetic retinopathy ([place]) 12/11/2016  . History of CVA (cerebrovascular accident) 12/11/2016  . Community acquired pneumonia of left lower lobe of lung 10/07/2015  . Estrogen deficiency 08/30/2015  . Encounter for Medicare annual wellness exam 04/17/2013  . Chest wall pain 04/07/2013  . Mobility impaired 06/26/2011  . History of retinal detachment 01/10/2011  . Sleep apnea 11/28/2010  . Anxiety and depression 08/25/2010  . Hemiplegia, late effect of cerebrovascular disease ([place]) 07/05/2010  . POSTHERPETIC NEURALGIA 11/09/2009  . Renal insufficiency 06/29/2008  . Chronic back pain 01/26/2008  . EDEMA 01/26/2008  . B12 deficiency 01/10/2007  . Type 2 diabetes, controlled, with retinopathy ([place]) 11/27/2006  . Essential hypertension 11/27/2006  . FIBROCYSTIC BREAST DISEASE 11/27/2006  . ROSACEA 11/27/2006  . OSTEOARTHRITIS 11/27/2006  . URINARY INCONTINENCE, MIXED 11/27/2006    Past Surgical History:  Procedure Laterality Date  . ABDOMINAL HYSTERECTOMY     BSO-fibroids  . APPENDECTOMY    . BACK SURGERY    . COLON SURGERY     due to punctured intestines  . EYE SURGERY     cataract  extraction  . IR THORACENTESIS ASP PLEURAL SPACE W/IMG GUIDE  06/07/2020  . KNEE SURGERY     arthroscope  . PARS PLANA VITRECTOMY  07/31/2011   Procedure: PARS PLANA VITRECTOMY WITH 25 GAUGE;  Surgeon: [name], MD;  Location: [facility];  Service: Ophthalmology;  Laterality: Right;  REMOVAL OF SILICONE OIL AND LASER RIGHT EYE  . RETINAL DETACHMENT SURGERY  02/18/11   times 2  . SPINE SURGERY  08/09   spinal decompression surgery     OB History   No obstetric history on file.     Family History  Problem Relation Age of Onset  . COPD Brother   .  Cancer Sister        brain tumor with hemmorhage  . Heart disease Sister        CAD    Social History   Tobacco Use  . Smoking status: Never Smoker  . Smokeless tobacco: Never Used  Substance Use Topics  . Alcohol use: No    Alcohol/week: 0.0 standard drinks  . Drug use: No    Home Medications Prior to Admission medications   Medication Sig Start Date End Date Taking? Authorizing Provider  meclizine (ANTIVERT) 25 MG tablet Take 1 tablet (25 mg total) by mouth 3 (three) times daily as needed for dizziness. 09/02/20  Yes Charlesetta Shanks, MD  acetaminophen (TYLENOL) 325 MG tablet Take 650 mg by mouth at bedtime.     [provider]  albuterol (VENTOLIN HFA) 108 (90 Base) MCG/ACT inhaler Inhale 2 puffs into the lungs every 4 (four) hours as needed for wheezing or shortness of breath. 07/13/19   Tower, Wynelle Fanny, MD  ALPRAZolam [name]) 0.5 MG tablet TAKE 1/2 TO 1 TABLET BY MOUTH TWICE A DAY AS NEEDED FOR ANXIETY 08/11/20   Tower, Wynelle Fanny, MD  amLODipine (NORVASC) 10 MG tablet Take 1 tablet (10 mg total) by mouth daily. 09/30/18   Tower, Wynelle Fanny, MD  amoxicillin-clavulanate (AUGMENTIN) 875-125 MG tablet Take 1 tablet by mouth 2 (two) times daily. 07/11/20   [name], MD  aspirin 325 MG tablet Take 325 mg by mouth daily.    [provider]  Blood Glucose Monitoring Suppl (ONE TOUCH ULTRA 2) w/Device KIT Check blood sugar once daily and as directed. Dx E11.9 08/27/18   Tower, Wynelle Fanny, MD  calcium-vitamin D (OSCAL WITH D) 500-200 MG-UNIT per tablet Take 1 tablet by mouth daily.    [provider]  Cholecalciferol (VITAMIN D) 1000 UNITS capsule Take 1,000 Units by mouth daily.    [provider]  Cranberry 500 MG TABS Take 500 mg by mouth in the morning and at bedtime.    [provider]  docusate sodium (COLACE) 100 MG capsule Take 200 mg by mouth at bedtime.    [provider]  ferrous sulfate 325 (65 FE) MG tablet Take 325 mg by  mouth 2 (two) times daily.    [provider]  furosemide (LASIX) 20 MG tablet TAKE 1/2 TABLET BY MOUTH DAILY 08/10/20   Chesley Mires, MD  gemfibrozil (LOPID) 600 MG tablet TAKE 1 TABLET BY MOUTH EVERY DAY 07/22/20   Tower, Roque Lias A, MD  glipiZIDE (GLUCOTROL XL) 2.5 MG 24 hr tablet TAKE 1 TABLET BY MOUTH EVERY DAY WITH BREAKFAST 08/24/20   Tower, Wynelle Fanny, MD  guaiFENesin-dextromethorphan (ROBITUSSIN DM) 100-10 MG/5ML syrup Take 5 mLs by mouth every 4 (four) hours as needed for cough. 06/11/20   Terrilee Croak, MD  hydrALAZINE (APRESOLINE) 100 MG tablet Take 100 mg by mouth 3 (three) times daily.  08/30/14   [provider]  Lancets (ONETOUCH DELICA PLUS IOXBDZ32D) [place] AND AS DIRECTED. DX E11.9 08/24/20   Tower, Wynelle Fanny, MD  metoprolol succinate (TOPROL-XL) 25 MG 24 hr tablet Take 0.5 tablets (12.5 mg total) by mouth 2 (two) times daily. 09/30/18   Tower, Wynelle Fanny, MD  mirtazapine (REMERON) 15 MG tablet TAKE 1/2 TABLET BY MOUTH AT BEDTIME 08/01/20   Tower, Wynelle Fanny, MD  Multiple Vitamin (MULTIVITAMIN) capsule Take 1 capsule by mouth daily.    [provider]  Multiple Vitamins-Minerals (PRESERVISION AREDS 2+MULTI VIT) CAPS Take 1 capsule by mouth in the morning and at bedtime.    [provider]  nitrofurantoin, macrocrystal-monohydrate, (MACROBID) 100 MG capsule Take 1 capsule (100 mg total) by mouth 2 (two) times daily. 08/10/20   Tower, Wynelle Fanny, MD  Olopatadine HCl (PATADAY OP) Place 1 drop into both eyes daily.    [provider]  ONETOUCH ULTRA test strip CHECK BLOOD SUGAR ONCE DAILY AND AS DIRECTED. DX E11.9 04/05/20   Tower, Wynelle Fanny, MD  sertraline (ZOLOFT) 50 MG tablet TAKE 1 TABLET BY MOUTH EVERY DAY 07/22/20   Tower, Wynelle Fanny, MD  trandolapril (MAVIK) 4 MG tablet Take 1 tablet (4 mg total) by mouth daily. 09/30/18   Tower, Wynelle Fanny, MD  triamcinolone (NASACORT) 55 MCG/ACT AERO nasal inhaler Place 2 sprays into the nose  daily. Patient taking differently: Place 2 sprays into the nose at bedtime. 03/28/20   [name], MD  vitamin B-12 (CYANOCOBALAMIN) 1000 MCG tablet Take 1,000 mcg by mouth daily.     [provider]    Allergies    Diclofenac sodium and Pioglitazone  Review of Systems   Review of Systems 10 systems reviewed and negative except as per HPI Physical Exam Updated Vital Signs BP (!) 147/80   Pulse 74   Temp (!) 97.3 F (36.3 C) (Oral)   Resp (!) 23   SpO2 97%   Physical Exam Constitutional:      Comments: Patient is alert.  Well-nourished well-developed.  No respiratory distress.  HENT:     Head: Normocephalic and atraumatic.     Right Ear: Tympanic membrane normal.     Mouth/Throat:     Mouth: Mucous membranes are moist.     Pharynx: Oropharynx is clear.  Eyes:     Comments: Patient has slight disconjugate gaze with lateral gaze.  Right eye appears to have lag.   Cardiovascular:     Rate and Rhythm: Normal rate and regular rhythm.  Pulmonary:     Effort: Pulmonary effort is normal.     Breath sounds: Normal breath sounds.  Abdominal:     General: There is no distension.     Palpations: Abdomen is soft.     Tenderness: There is no abdominal tenderness. There is no guarding.  Musculoskeletal:     Comments: No peripheral edema.  Lower extremities are in good condition.  Neurological:     Comments: Patient is alert.  No apparent aphasia.  She can appropriately follow commands and give historical elements.  Dysmetria with left finger-nose exam.  Also, slight weakness left lower leg compared to right.  Sensation intact to light touch.  Patient does not have any neglect.  Psychiatric:        Mood and Affect: Mood normal.     ED Results / Procedures /  Treatments   Labs (all labs ordered are listed, but only abnormal results are displayed) Labs Reviewed  COMPREHENSIVE METABOLIC PANEL - Abnormal; Notable for the following components:      Result Value    Sodium 134 (*)    Glucose, Bld 396 (*)    BUN 29 (*)    Creatinine, Ser 1.64 (*)    Total Protein 5.5 (*)    Albumin 3.4 (*)    GFR, Estimated 30 (*)    All other components within normal limits  URINALYSIS, ROUTINE W REFLEX MICROSCOPIC - Abnormal; Notable for the following components:   Glucose, UA >=500 (*)    Leukocytes,Ua TRACE (*)    Bacteria, UA RARE (*)    All other components within normal limits  CBG MONITORING, ED - Abnormal; Notable for the following components:   Glucose-Capillary 400 (*)    All other components within normal limits  I-STAT CHEM 8, ED - Abnormal; Notable for the following components:   BUN 30 (*)    Creatinine, Ser 1.60 (*)    Glucose, Bld 365 (*)    All other components within normal limits  RESP PANEL BY RT-PCR (FLU A&B, COVID) ARPGX2  ETHANOL  PROTIME-INR  APTT  CBC  DIFFERENTIAL  RAPID URINE DRUG SCREEN, HOSP PERFORMED    EKG EKG Interpretation  Date/Time:  Friday September 02 2020 18:52:30 EST Ventricular Rate:  72 PR Interval:    QRS Duration: 96 QT Interval:  416 QTC Calculation: 436 R Axis:   -87 Text Interpretation: Sinus rhythm Atrial premature complex Left anterior fascicular block Low voltage, precordial leads unchanged from previousexcept improved rate control Confirmed by Charlesetta Shanks [phone]) on 09/02/2020 7:05:19 PM   Radiology CT HEAD WO CONTRAST  Result Date: 09/02/2020 CLINICAL DATA:  Altered mental status EXAM: CT HEAD WITHOUT CONTRAST TECHNIQUE: Contiguous axial images were obtained from the base of the skull through the vertex without intravenous contrast. COMPARISON:  MRI 02/12/2020, CT 02/10/2020 FINDINGS: Brain: No acute territorial infarction, hemorrhage or intracranial mass. Chronic right basal ganglial and white matter infarct with encephalomalacia. Moderate atrophy. Moderate hypodensity in the white matter consistent with chronic small vessel ischemic change. Stable ventricle size. Vascular: No hyperdense vessels.   Carotid vascular calcification Skull: Normal. Negative for fracture or focal lesion. Sinuses/Orbits: No acute finding. Other: None IMPRESSION: 1. No CT evidence for acute intracranial abnormality. 2. Atrophy and chronic small vessel ischemic changes of the white matter. Chronic right basal ganglial and white matter infarct. Electronically Signed   By: Donavan Foil M.D.   On: 09/02/2020 21:07    Procedures Procedures (including critical care time)  Medications Ordered in ED Medications  meclizine (ANTIVERT) tablet 25 mg (25 mg Oral Given 09/02/20 2218)    ED Course  I have reviewed the triage vital signs and the nursing notes.  Pertinent labs & imaging results that were available during my care of the patient were reviewed by me and considered in my medical decision making (see chart for details).    MDM Rules/Calculators/A&P                         Patient presents with symptoms of vertigo that were present upon first awakening this morning.  Patient describes a spinning dizziness that prevented her from getting out of bed first thing in the morning.  Over the course of the day, patient describes the symptoms as improving somewhat.  On examination, patient does have disconjugate gaze and  dysmetria on the left.  Review of EMR indicates that this is similar to her presentation 6 months ago.  We will proceed with diagnostic evaluation.  Will obtain CT head.  Consult: Neurology Dr. Curly Shores.  Will assess the patient in the emergency department   Dr. Curly Shores has examined the patient.  She advises findings are consistent with benign positional vertigo.  Patient given meclizine.  She feels improved.  She is reassessed with ambulating.  She can use her walker at baseline.  Patient's gait is slightly unsteady but she reports this is her normal gait she is mental status is clear.  She has good cognitive function.  At this time plan will be for discharge.  I reviewed the case with the patient's niece who  will come and pick her up.  She will also check on her and assess for continued safety at home. Final Clinical Impression(s) / ED Diagnoses Final diagnoses:  Benign paroxysmal positional vertigo, unspecified laterality    Rx / DC Orders ED Discharge Orders         Ordered    meclizine (ANTIVERT) 25 MG tablet  3 times daily PRN        09/02/20 2355           Charlesetta Shanks, MD 09/02/20 2359

## 2020-09-02 NOTE — ED Triage Notes (Signed)
Pt bib ems from home due to family concerns for altered mental status. Pt has had increased weakness and acts like a "deer in head lights" starting today. Recent UTI noted.   Left facial droop from past CVA  Vitals: BP: 125/75 HR: 62 O2: 96% RA CBG: 471

## 2020-09-02 NOTE — Discharge Instructions (Addendum)
1.  You may take meclizine (Antivert) every 8 hours if needed for spinning quality dizziness 2.  Schedule follow-up with your doctor for recheck within the next 3 to 5 days 3.  Return to the emergency department if you are getting worsening symptoms or other concerning symptoms develop

## 2020-09-02 NOTE — Consult Note (Signed)
Neurology Consultation Reason for Consult: Recurrent dizziness Requesting Physician: Dr. Charlesetta Shanks  CC: Dizziness  History is obtained from: Patient and chart review  HPI: Renee [name] is a 87 y.o. female with past medical history significant for right basal ganglia stroke (residual left-sided weakness, 2011), diabetes, hypertension, hyperlipidemia, CKD, sleep apnea, vitamin B12 deficiency, degenerative disc disease.  She presents again with a positional dizziness that is worse with head movement and improved with laying still.  She last presented with similar symptoms on 02/12/2020.  She was initially discharged from the ED after observation but then readmitted.  At that time her dizziness was described as positional, resolving with laying still.  She was also noted to have a UTI, and blood pressures were in the 180s/70s.  She was also noted to be dehydrated due to poor oral oral intake secondary to nausea and bradycardic which is felt to be in the setting of her metoprolol use  She additionally presented to the ED on 10/19 with fever, shortness of breath, and cough, found to have a pneumonia of the left lower lung which improved with Rocephin/azithromycin and prednisone taper, and she was again treated for recurrent UTI  She was discharged with 24-hour supervision at home (had already had 6 hours of home health aide at home previously)   LKW: bedtime on 1/13 tPA given?: No, not a stroke Premorbid modified rankin scale:      1 - No significant disability. Able to carry out all usual activities, despite some symptoms.  ROS: A 14 point ROS was performed and is negative except as noted in the HPI.   Past Medical History:  Diagnosis Date  . Angioedema    possibly from voltaren  . Bronchopneumonia 12/11/2016  . Degenerative disc disease   . Diabetes mellitus    type II  . Hyperlipidemia   . Hypertension   . LVH (left ventricular hypertrophy)    and atrial enlargement by echo  in past with nl EF  . Nasal pruritis   . Osteoarthritis   . Osteopenia   . Renal insufficiency   . Sleep apnea   . Stroke [facility]) 05/2010   Small vessel sobcortical (in Point trial) with Dr Leonie Man, residual L hemiparesis  . Vitamin B 12 deficiency 04/08   Past Surgical History:  Procedure Laterality Date  . ABDOMINAL HYSTERECTOMY     BSO-fibroids  . APPENDECTOMY    . BACK SURGERY    . COLON SURGERY     due to punctured intestines  . EYE SURGERY     cataract extraction  . IR THORACENTESIS ASP PLEURAL SPACE W/IMG GUIDE  06/07/2020  . KNEE SURGERY     arthroscope  . PARS PLANA VITRECTOMY  07/31/2011   Procedure: PARS PLANA VITRECTOMY WITH 25 GAUGE;  Surgeon: [name], MD;  Location: [place];  Service: Ophthalmology;  Laterality: Right;  REMOVAL OF SILICONE OIL AND LASER RIGHT EYE  . RETINAL DETACHMENT SURGERY  02/18/11   times 2  . SPINE SURGERY  08/09   spinal decompression surgery   Current Outpatient Medications  Medication Instructions  . acetaminophen (TYLENOL) 650 mg, Oral, Daily at bedtime  . albuterol (VENTOLIN HFA) 108 (90 Base) MCG/ACT inhaler 2 puffs, Inhalation, Every 4 hours PRN  . ALPRAZolam (XANAX) 0.5 MG tablet TAKE 1/2 TO 1 TABLET BY MOUTH TWICE A DAY AS NEEDED FOR ANXIETY  . amLODipine (NORVASC) 10 mg, Oral, Daily  . amoxicillin-clavulanate (AUGMENTIN) 875-125 MG tablet 1 tablet, Oral, 2 times  daily  . aspirin 325 mg, Oral, Daily  . Blood Glucose Monitoring Suppl (ONE TOUCH ULTRA 2) w/Device KIT Check blood sugar once daily and as directed. Dx E11.9   . calcium-vitamin D (OSCAL WITH D) 500-200 MG-UNIT per tablet 1 tablet, Oral, Daily  . Cranberry 500 mg, Oral, 2 times daily  . docusate sodium (COLACE) 200 mg, Oral, Daily at bedtime  . ferrous sulfate 325 mg, Oral, 2 times daily  . furosemide (LASIX) 20 MG tablet TAKE 1/2 TABLET BY MOUTH DAILY  . gemfibrozil (LOPID) 600 MG tablet TAKE 1 TABLET BY MOUTH EVERY DAY  . glipiZIDE (GLUCOTROL XL) 2.5 MG 24 hr  tablet TAKE 1 TABLET BY MOUTH EVERY DAY WITH BREAKFAST  . guaiFENesin-dextromethorphan (ROBITUSSIN DM) 100-10 MG/5ML syrup 5 mLs, Oral, Every 4 hours PRN  . hydrALAZINE (APRESOLINE) 100 mg, Oral, 3 times daily  . Lancets (ONETOUCH DELICA PLUS [id number]) MISC CHECK BLOOD SUGAR ONCE DAILY AND AS DIRECTED. DX E11.9  . metoprolol succinate (TOPROL-XL) 12.5 mg, Oral, 2 times daily  . mirtazapine (REMERON) 15 MG tablet TAKE 1/2 TABLET BY MOUTH AT BEDTIME  . Multiple Vitamin (MULTIVITAMIN) capsule 1 capsule, Oral, Daily  . Multiple Vitamins-Minerals (PRESERVISION AREDS 2+MULTI VIT) CAPS 1 capsule, Oral, 2 times daily  . nitrofurantoin (macrocrystal-monohydrate) (MACROBID) 100 mg, Oral, 2 times daily  . Olopatadine HCl (PATADAY OP) 1 drop, Both Eyes, Daily  . ONETOUCH ULTRA test strip CHECK BLOOD SUGAR ONCE DAILY AND AS DIRECTED. DX E11.9  . sertraline (ZOLOFT) 50 MG tablet TAKE 1 TABLET BY MOUTH EVERY DAY  . trandolapril (MAVIK) 4 mg, Oral, Daily  . triamcinolone (NASACORT) 55 MCG/ACT AERO nasal inhaler 2 sprays, Nasal, Daily  . vitamin B-12 (CYANOCOBALAMIN) 1,000 mcg, Oral, Daily  . Vitamin D 1,000 Units, Oral, Daily     Family History  Problem Relation Age of Onset  . COPD Brother   . Cancer Sister        brain tumor with hemmorhage  . Heart disease Sister        CAD   Social History:  reports that she has never smoked. She has never used smokeless tobacco. She reports that she does not drink alcohol and does not use drugs.   Exam: Current vital signs: BP 120/65   Pulse 63   Temp (!) 97.3 F (36.3 C) (Oral)   Resp 17   SpO2 96%  Vital signs in last 24 hours: Temp:  [97.3 F (36.3 C)] 97.3 F (36.3 C) (01/14 2043) Pulse Rate:  [63] 63 (01/14 2000) Resp:  [17] 17 (01/14 2000) BP: (120)/(65) 120/65 (01/14 2000) SpO2:  [96 %] 96 % (01/14 2000)   Physical Exam  Constitutional: Appears well-developed and well-nourished.  Psych: Flat affect Eyes: No scleral injection HENT: No  OP obstruction, fair dentition MSK: no joint deformities.  Cardiovascular: Normal rate and regular rhythm.  Respiratory: Effort normal, non-labored breathing, occasional slight wheezing GI: Soft.  No distension. There is no tenderness.  Skin: Mild to moderate edema of the bilateral lower extremities, tender to touch  Neuro: Mental Status: Patient is awake, alert, oriented to person, place, month, year, and situation. Patient is able to give a clear and coherent history. No signs of aphasia or neglect other than some slight difficulty repeating "no ifs, ands or buts" likely secondary to being hard of hearing Cranial Nerves: II: Visual Fields are full, she still sometimes struggles slightly with the left upper quadrant but always orients to stimulus in all fields of view.  Right pupil is postsurgical but reactive to light, slitlike with coloboma in the inferior section.  Left pupil is 4 to 2 mm round III,IV, VI: EOMI without ptosis or diploplia.  She does have saccadic intrusions, which slightly complicates test of skew but there is no clear vertical eye movement on cover-uncover testing.  V: Facial sensation is symmetric to temperature VII: Facial movement is notable for a mild facial droop, baseline.  VIII: hearing is intact to voice, though she is significantly hard of hearing at baseline, it is symmetric to finger rub as well X: Uvula elevates symmetrically XI: Shoulder shrug is symmetric, as is head turn. XII: tongue is midline without atrophy or fasciculations.  Motor: Tone is notable for some paratonia especially in the left upper extremity, likely masking some underlying spasticity. Bulk is normal.  5/5 on the right, 4/5 on the left, bilateral lower extremity testing somewhat limited secondary to pain Sensory: Sensation is intact in all 4 extremities to light touch and there is no extension to double simultaneous stimulus Deep Tendon Reflexes: 2+ and symmetric in the biceps, pain  limited patellars Plantars: Toes are mute bilaterally though again she has some difficulty relaxing Cerebellar: FNF and HKS are intact bilaterally, with some mild dysmetria   Dix-Hallpike maneuver was performed with head turn to the left.  With a brief delay, patient did demonstrate typical rotary nystagmus consistent with BPPV  I have reviewed labs in epic and the results pertinent to this consultation are: UA with small leukocytes, positive protein, negative glucose, increased specific gravity (greater than 1.03) Urine culture with mixed flora suggestive of contamination Elevated blood glucose of 400, BUN 38, creatinine 1.6 (baseline BUN 26 and baseline creatinine 1.2-1.5) COVID-19 pending  Lab Results  Component Value Date   VITAMINB12 530 03/30/2019   Lab Results  Component Value Date   TSH 6.80 (H) 03/30/2019   T4 0.64 (WNL) 03/30/19  I have reviewed the images obtained: Head CT personally reviewed, no acute intracranial process.  Impression: 87 year old woman with baseline neurological deficits secondary to stroke presenting with recurrence of positional dizziness, consistent with BPPV on my examination.  There may also be some slight recrudescence of her stroke symptoms in the setting of hyperglycemia and dehydration based on her blood sugar and urine studies.  Recommendations: -PT/OT for Epley maneuver -B12 and TSH to confirm no metabolic component -No MRI or stroke work-up indicated at this time -Management of hyperglycemia, dehydration and lower extremity edema per ED / primary team   [place] [phone]

## 2020-09-02 NOTE — Telephone Encounter (Signed)
Called pt and still no answer, will hear a click and then dead air, tried to call x2

## 2020-09-02 NOTE — Telephone Encounter (Signed)
-----   Message from Abner Greenspan, MD sent at 09/02/2020  1:02 PM EST ----- Urine culture did not indicate uti (some contamination)  I suspect her symptoms will improve with better fluid intake (also that will help prevent uti)  I know she wanted to go on prophylaxis for uti-had discussed macrobid for this but given most recent kidney labs I think that would be too hard on her kidney  Would prefer keflex 250 mg daily  If open to that please send in 30 with 5 refills

## 2020-09-03 LAB — RAPID URINE DRUG SCREEN, HOSP PERFORMED
Amphetamines: NOT DETECTED
Barbiturates: NOT DETECTED
Benzodiazepines: POSITIVE — AB
Cocaine: NOT DETECTED
Opiates: NOT DETECTED
Tetrahydrocannabinol: NOT DETECTED

## 2020-09-08 DIAGNOSIS — E785 Hyperlipidemia, unspecified: Secondary | ICD-10-CM | POA: Diagnosis not present

## 2020-09-08 DIAGNOSIS — I69354 Hemiplegia and hemiparesis following cerebral infarction affecting left non-dominant side: Secondary | ICD-10-CM | POA: Diagnosis not present

## 2020-09-08 DIAGNOSIS — R69 Illness, unspecified: Secondary | ICD-10-CM | POA: Diagnosis not present

## 2020-09-08 DIAGNOSIS — D631 Anemia in chronic kidney disease: Secondary | ICD-10-CM | POA: Diagnosis not present

## 2020-09-08 DIAGNOSIS — I1310 Hypertensive heart and chronic kidney disease without heart failure, with stage 1 through stage 4 chronic kidney disease, or unspecified chronic kidney disease: Secondary | ICD-10-CM | POA: Diagnosis not present

## 2020-09-08 DIAGNOSIS — N390 Urinary tract infection, site not specified: Secondary | ICD-10-CM | POA: Diagnosis not present

## 2020-09-08 DIAGNOSIS — N1832 Chronic kidney disease, stage 3b: Secondary | ICD-10-CM | POA: Diagnosis not present

## 2020-09-08 DIAGNOSIS — E1122 Type 2 diabetes mellitus with diabetic chronic kidney disease: Secondary | ICD-10-CM | POA: Diagnosis not present

## 2020-09-08 DIAGNOSIS — E1165 Type 2 diabetes mellitus with hyperglycemia: Secondary | ICD-10-CM | POA: Diagnosis not present

## 2020-09-12 DIAGNOSIS — D631 Anemia in chronic kidney disease: Secondary | ICD-10-CM | POA: Diagnosis not present

## 2020-09-12 DIAGNOSIS — N1832 Chronic kidney disease, stage 3b: Secondary | ICD-10-CM | POA: Diagnosis not present

## 2020-09-12 DIAGNOSIS — E785 Hyperlipidemia, unspecified: Secondary | ICD-10-CM | POA: Diagnosis not present

## 2020-09-12 DIAGNOSIS — I69354 Hemiplegia and hemiparesis following cerebral infarction affecting left non-dominant side: Secondary | ICD-10-CM | POA: Diagnosis not present

## 2020-09-12 DIAGNOSIS — I1310 Hypertensive heart and chronic kidney disease without heart failure, with stage 1 through stage 4 chronic kidney disease, or unspecified chronic kidney disease: Secondary | ICD-10-CM | POA: Diagnosis not present

## 2020-09-12 DIAGNOSIS — E1165 Type 2 diabetes mellitus with hyperglycemia: Secondary | ICD-10-CM | POA: Diagnosis not present

## 2020-09-12 DIAGNOSIS — R69 Illness, unspecified: Secondary | ICD-10-CM | POA: Diagnosis not present

## 2020-09-12 DIAGNOSIS — N390 Urinary tract infection, site not specified: Secondary | ICD-10-CM | POA: Diagnosis not present

## 2020-09-12 DIAGNOSIS — E1122 Type 2 diabetes mellitus with diabetic chronic kidney disease: Secondary | ICD-10-CM | POA: Diagnosis not present

## 2020-09-13 ENCOUNTER — Ambulatory Visit (INDEPENDENT_AMBULATORY_CARE_PROVIDER_SITE_OTHER): Payer: Medicare HMO | Admitting: Family Medicine

## 2020-09-13 ENCOUNTER — Other Ambulatory Visit: Payer: Self-pay

## 2020-09-13 VITALS — BP 152/70 | HR 66 | Temp 97.5°F | Wt 166.0 lb

## 2020-09-13 DIAGNOSIS — Z7689 Persons encountering health services in other specified circumstances: Secondary | ICD-10-CM | POA: Diagnosis not present

## 2020-09-13 DIAGNOSIS — E1165 Type 2 diabetes mellitus with hyperglycemia: Secondary | ICD-10-CM | POA: Diagnosis not present

## 2020-09-13 DIAGNOSIS — H8110 Benign paroxysmal vertigo, unspecified ear: Secondary | ICD-10-CM | POA: Diagnosis not present

## 2020-09-13 DIAGNOSIS — N390 Urinary tract infection, site not specified: Secondary | ICD-10-CM | POA: Diagnosis not present

## 2020-09-13 DIAGNOSIS — N184 Chronic kidney disease, stage 4 (severe): Secondary | ICD-10-CM

## 2020-09-13 MED ORDER — SITAGLIPTIN PHOSPHATE 100 MG PO TABS: 100.0000 mg | ORAL_TABLET | Freq: Every day | ORAL | 2 refills | Status: DC

## 2020-09-13 NOTE — Progress Notes (Signed)
Subjective:    Patient ID: Renee [name], female    DOB: 1928/12/01, 87 y.o.   MRN: [id number]  HPI Patient is a very frail 87 year old Caucasian female here today to establish care.  She is accompanied by her sister.  Over the last week she has been suffering from vertigo.  She states that whenever she wakes up in the morning, she starts to sit up in bed, the room will spin.  The spinning will last minutes and will gradually subside on its own.  Throughout the day, if she moves in a certain direction, the vertigo will return.  She has chronic hearing loss but there has been no sudden change in her hearing.  She denies any tinnitus or headaches or head injury.  She is also concerned because she has had reportedly for urinary tract infections that sent her to the hospital in the last year.  Reviewing her lab work, her recent blood sugars have been in the 300s.  On her last urinalysis there was significant glucosuria.  I believe that that coupled with her sedentary lifestyle and immobility is likely causing the recurrent urinary tract infections.  She states that she is always thirsty.  She states that she pees frequently however she attributed that to her Lasix.  Her last A1c in May was outstanding.  She is on low-dose glipizide.  She denies any hypoglycemic episodes.  Because of her kidney function, she cannot tolerate Metformin.  She states that she has a history of pulmonary edema and therefore I do not think Actos is a good option.  Given the frequent bladder infections I would want to stay away from [place], etc.  Therefore her options are to increase glipizide, add GLP-1 agonist, or add Januvia.  Given her frailty I am in favor more of the Januvia due to the fewer side effects.  She is also concerned that she is on too many medications.  I reviewed her medication list with her.  Her last hemoglobin was outstanding.  Therefore I believe it is okay for her to stop her iron.  She also is uncertain of why  she is on mirtazapine.  Given the fact that she is on Zoloft and her family denies poor appetite, I feel that she can stop the mirtazapine however I explained to the patient that the medications are justified and warranted for her to continue. Past Medical History:  Diagnosis Date  . Angioedema    possibly from voltaren  . Bronchopneumonia 12/11/2016  . Degenerative disc disease   . Diabetes mellitus    type II  . Hyperlipidemia   . Hypertension   . LVH (left ventricular hypertrophy)    and atrial enlargement by echo in past with nl EF  . Nasal pruritis   . Osteoarthritis   . Osteopenia   . Renal insufficiency   . Sleep apnea   . Stroke [facility]) 05/2010   Small vessel sobcortical (in Point trial) with Dr Leonie Man, residual L hemiparesis  . Vitamin B 12 deficiency 04/08   Past Surgical History:  Procedure Laterality Date  . ABDOMINAL HYSTERECTOMY     BSO-fibroids  . APPENDECTOMY    . BACK SURGERY    . COLON SURGERY     due to punctured intestines  . EYE SURGERY     cataract extraction  . IR THORACENTESIS ASP PLEURAL SPACE W/IMG GUIDE  06/07/2020  . KNEE SURGERY     arthroscope  . PARS PLANA VITRECTOMY  07/31/2011  Procedure: PARS PLANA VITRECTOMY WITH 25 GAUGE;  Surgeon: [name], MD;  Location: [place];  Service: Ophthalmology;  Laterality: Right;  REMOVAL OF SILICONE OIL AND LASER RIGHT EYE  . RETINAL DETACHMENT SURGERY  02/18/11   times 2  . SPINE SURGERY  08/09   spinal decompression surgery   Current Outpatient Medications on File Prior to Visit  Medication Sig Dispense Refill  . albuterol (VENTOLIN HFA) 108 (90 Base) MCG/ACT inhaler Inhale 2 puffs into the lungs every 4 (four) hours as needed for wheezing or shortness of breath. 18 g 3  . ALPRAZolam (XANAX) 0.5 MG tablet TAKE 1/2 TO 1 TABLET BY MOUTH TWICE A DAY AS NEEDED FOR ANXIETY 60 tablet 2  . amLODipine (NORVASC) 10 MG tablet Take 1 tablet (10 mg total) by mouth daily. 90 tablet 3  . Blood Glucose Monitoring  Suppl (ONE TOUCH ULTRA 2) w/Device KIT Check blood sugar once daily and as directed. Dx E11.9 1 each 0  . calcium-vitamin D (OSCAL WITH D) 500-200 MG-UNIT per tablet Take 1 tablet by mouth daily.    . Cholecalciferol (VITAMIN D) 1000 UNITS capsule Take 1,000 Units by mouth daily.    . Cranberry 500 MG TABS Take 500 mg by mouth in the morning and at bedtime.    . docusate sodium (COLACE) 100 MG capsule Take 200 mg by mouth at bedtime.    . ferrous sulfate 325 (65 FE) MG tablet Take 325 mg by mouth 2 (two) times daily.    . furosemide (LASIX) 20 MG tablet TAKE 1/2 TABLET BY MOUTH DAILY 30 tablet 3  . gemfibrozil (LOPID) 600 MG tablet TAKE 1 TABLET BY MOUTH EVERY DAY 90 tablet 1  . glipiZIDE (GLUCOTROL XL) 2.5 MG 24 hr tablet TAKE 1 TABLET BY MOUTH EVERY DAY WITH BREAKFAST 90 tablet 3  . hydrALAZINE (APRESOLINE) 100 MG tablet Take 100 mg by mouth 3 (three) times daily.     . Lancets (ONETOUCH DELICA PLUS [id number]) MISC CHECK BLOOD SUGAR ONCE DAILY AND AS DIRECTED. DX E11.9 100 each 2  . meclizine (ANTIVERT) 25 MG tablet Take 1 tablet (25 mg total) by mouth 3 (three) times daily as needed for dizziness. 30 tablet 0  . metoprolol succinate (TOPROL-XL) 25 MG 24 hr tablet Take 0.5 tablets (12.5 mg total) by mouth 2 (two) times daily. 90 tablet 3  . mirtazapine (REMERON) 15 MG tablet TAKE 1/2 TABLET BY MOUTH AT BEDTIME 45 tablet 3  . Multiple Vitamin (MULTIVITAMIN) capsule Take 1 capsule by mouth daily.    . Multiple Vitamins-Minerals (PRESERVISION AREDS 2+MULTI VIT) CAPS Take 1 capsule by mouth in the morning and at bedtime.    . Olopatadine HCl (PATADAY OP) Place 1 drop into both eyes daily.    Glory Rosebush ULTRA test strip CHECK BLOOD SUGAR ONCE DAILY AND AS DIRECTED. DX E11.9 100 strip 1  . sertraline (ZOLOFT) 50 MG tablet TAKE 1 TABLET BY MOUTH EVERY DAY 90 tablet 1  . trandolapril (MAVIK) 4 MG tablet Take 1 tablet (4 mg total) by mouth daily. 90 tablet 3  . triamcinolone (NASACORT) 55 MCG/ACT AERO  nasal inhaler Place 2 sprays into the nose daily. (Patient taking differently: Place 2 sprays into the nose at bedtime.) 1 each 5  . vitamin B-12 (CYANOCOBALAMIN) 1000 MCG tablet Take 1,000 mcg by mouth daily.     Marland Kitchen guaiFENesin-dextromethorphan (ROBITUSSIN DM) 100-10 MG/5ML syrup Take 5 mLs by mouth every 4 (four) hours as needed for cough. (Patient not  taking: Reported on 09/13/2020) 118 mL 0   No current facility-administered medications on file prior to visit.   Allergies  Allergen Reactions  . Diclofenac Sodium Other (See Comments)    REACTION: angioedema  . Pioglitazone Swelling   Social History   Socioeconomic History  . Marital status: Widowed    Spouse name: Not on file  . Number of children: Not on file  . Years of education: Not on file  . Highest education level: Not on file  Occupational History  . Not on file  Tobacco Use  . Smoking status: Never Smoker  . Smokeless tobacco: Never Used  Vaping Use  . Vaping Use: Not on file  Substance and Sexual Activity  . Alcohol use: No    Alcohol/week: 0.0 standard drinks  . Drug use: No  . Sexual activity: Never  Other Topics Concern  . Not on file  Social History Narrative  . Not on file   Social Determinants of Health   Financial Resource Strain: Not on file  Food Insecurity: Not on file  Transportation Needs: Not on file  Physical Activity: Not on file  Stress: Not on file  Social Connections: Not on file  Intimate Partner Violence: Not on file      Review of Systems  All other systems reviewed and are negative.      Objective:   Physical Exam Vitals reviewed.  Constitutional:      General: She is not in acute distress.    Appearance: Normal appearance. She is obese. She is not ill-appearing, toxic-appearing or diaphoretic.  HENT:     Right Ear: Tympanic membrane and ear canal normal.     Left Ear: Tympanic membrane and ear canal normal.     Nose: Nose normal. No congestion or rhinorrhea.   Cardiovascular:     Rate and Rhythm: Normal rate and regular rhythm.     Heart sounds: Normal heart sounds. No murmur heard. No friction rub. No gallop.   Pulmonary:     Effort: Pulmonary effort is normal. No respiratory distress.     Breath sounds: Normal breath sounds. No stridor. No wheezing, rhonchi or rales.  Musculoskeletal:     Right lower leg: No edema.     Left lower leg: No edema.  Neurological:     Mental Status: She is alert and oriented to person, place, and time. Mental status is at baseline.     Sensory: No sensory deficit.     Motor: Weakness present.     Coordination: Coordination abnormal.     Gait: Gait abnormal.   Chronic left facial droop due to her previous remote right basal ganglial infarct        Assessment & Plan:  Type 2 diabetes mellitus with hyperglycemia, without long-term current use of insulin (HCC) - Plan: Hemoglobin A1c, CBC with Differential/Platelet, COMPLETE METABOLIC PANEL WITH GFR  Frequent UTI  Benign paroxysmal positional vertigo, unspecified laterality  CKD (chronic kidney disease) stage 4, GFR 15-29 ml/min (HCC)  Encounter to establish care with new doctor  History sounds consistent with BPPV.  Symptoms of been present for 1 week.  Given her age and frailty in the relatively mild symptoms I recommended stopping the meclizine unless she is extremely nauseated and dizzy because am concerned about sedation and falls.  I anticipate gradual self resolution of the vertigo over the next 2 weeks.  If not I would consult physical therapy for Epley maneuvers.  I believe the recurrent urinary tract infections  are due to her sedentary lifestyle her immobility and compounded by dehydration and glucosuria.  Therefore I want to focus on lowering her blood sugars.  For the reasons I mentioned in the history of present illness I will start Januvia 100 mg a day and check an A1c.  If significantly elevated we may try to increase the glipizide however I hate  to do that given the risk of hypoglycemia without medication.  Another option would be to add Trulicity instead of Januvia.  For the time being I will start Januvia 100 mg a day however and reassess blood sugars via telephone in 2 weeks.  Discontinue iron supplement.  Discontinue meclizine.  Discontinue mirtazapine

## 2020-09-14 LAB — CBC WITH DIFFERENTIAL/PLATELET
Absolute Monocytes: 442 cells/uL (ref 200–950)
Basophils Absolute: 62 cells/uL (ref 0–200)
Basophils Relative: 1.1 %
Eosinophils Absolute: 140 cells/uL (ref 15–500)
Eosinophils Relative: 2.5 %
HCT: 37.5 % (ref 35.0–45.0)
Hemoglobin: 12.3 g/dL (ref 11.7–15.5)
Lymphs Abs: 1137 cells/uL (ref 850–3900)
MCH: 28.9 pg (ref 27.0–33.0)
MCHC: 32.8 g/dL (ref 32.0–36.0)
MCV: 88 fL (ref 80.0–100.0)
MPV: 10.8 fL (ref 7.5–12.5)
Monocytes Relative: 7.9 %
Neutro Abs: 3819 cells/uL (ref 1500–7800)
Neutrophils Relative %: 68.2 %
Platelets: 185 10*3/uL (ref 140–400)
RBC: 4.26 10*6/uL (ref 3.80–5.10)
RDW: 13 % (ref 11.0–15.0)
Total Lymphocyte: 20.3 %
WBC: 5.6 10*3/uL (ref 3.8–10.8)

## 2020-09-14 LAB — COMPLETE METABOLIC PANEL WITH GFR
AG Ratio: 2.4 (calc) (ref 1.0–2.5)
ALT: 12 U/L (ref 6–29)
AST: 13 U/L (ref 10–35)
Albumin: 3.9 g/dL (ref 3.6–5.1)
Alkaline phosphatase (APISO): 85 U/L (ref 37–153)
BUN/Creatinine Ratio: 23 (calc) — ABNORMAL HIGH (ref 6–22)
BUN: 34 mg/dL — ABNORMAL HIGH (ref 7–25)
CO2: 24 mmol/L (ref 20–32)
Calcium: 9.1 mg/dL (ref 8.6–10.4)
Chloride: 104 mmol/L (ref 98–110)
Creat: 1.46 mg/dL — ABNORMAL HIGH (ref 0.60–0.88)
GFR, Est African American: 37 mL/min/{1.73_m2} — ABNORMAL LOW (ref >=60)
GFR, Est Non African American: 32 mL/min/{1.73_m2} — ABNORMAL LOW (ref >=60)
Globulin: 1.6 g/dL (calc) — ABNORMAL LOW (ref 1.9–3.7)
Glucose, Bld: 334 mg/dL — ABNORMAL HIGH (ref 65–99)
Potassium: 4.6 mmol/L (ref 3.5–5.3)
Sodium: 138 mmol/L (ref 135–146)
Total Bilirubin: 0.3 mg/dL (ref 0.2–1.2)
Total Protein: 5.5 g/dL — ABNORMAL LOW (ref 6.1–8.1)

## 2020-09-14 LAB — HEMOGLOBIN A1C
Hgb A1c MFr Bld: 7.9 % of total Hgb — ABNORMAL HIGH (ref <5.7)
Mean Plasma Glucose: 180 mg/dL
eAG (mmol/L): 10 mmol/L

## 2020-09-14 NOTE — Telephone Encounter (Signed)
Contacted pt and advised of PCP msg. Pt decllined keflex. Pt also reported she saw a new PCP at [facility] yesterday and they took her off of glipizide and started her on Januvia. Inquired if pt is now seeing them as PCP. Pt said she was and it was easier for her because the office was closer. Advised pt this information would be relayed to Dr. Glori Bickers. Pt appreciative and verbalized understanding. No medications sent in.

## 2020-09-19 DIAGNOSIS — N390 Urinary tract infection, site not specified: Secondary | ICD-10-CM | POA: Diagnosis not present

## 2020-09-19 DIAGNOSIS — E1165 Type 2 diabetes mellitus with hyperglycemia: Secondary | ICD-10-CM | POA: Diagnosis not present

## 2020-09-19 DIAGNOSIS — I1310 Hypertensive heart and chronic kidney disease without heart failure, with stage 1 through stage 4 chronic kidney disease, or unspecified chronic kidney disease: Secondary | ICD-10-CM | POA: Diagnosis not present

## 2020-09-19 DIAGNOSIS — R69 Illness, unspecified: Secondary | ICD-10-CM | POA: Diagnosis not present

## 2020-09-19 DIAGNOSIS — E785 Hyperlipidemia, unspecified: Secondary | ICD-10-CM | POA: Diagnosis not present

## 2020-09-19 DIAGNOSIS — N1832 Chronic kidney disease, stage 3b: Secondary | ICD-10-CM | POA: Diagnosis not present

## 2020-09-19 DIAGNOSIS — E1122 Type 2 diabetes mellitus with diabetic chronic kidney disease: Secondary | ICD-10-CM | POA: Diagnosis not present

## 2020-09-19 DIAGNOSIS — I69354 Hemiplegia and hemiparesis following cerebral infarction affecting left non-dominant side: Secondary | ICD-10-CM | POA: Diagnosis not present

## 2020-09-19 DIAGNOSIS — D631 Anemia in chronic kidney disease: Secondary | ICD-10-CM | POA: Diagnosis not present

## 2020-09-20 ENCOUNTER — Other Ambulatory Visit: Payer: Self-pay | Admitting: Family Medicine

## 2020-09-20 ENCOUNTER — Encounter: Payer: Self-pay | Admitting: Family Medicine

## 2020-09-26 ENCOUNTER — Telehealth: Payer: Self-pay | Admitting: *Deleted

## 2020-09-26 ENCOUNTER — Ambulatory Visit (INDEPENDENT_AMBULATORY_CARE_PROVIDER_SITE_OTHER): Payer: Medicare HMO | Admitting: Family Medicine

## 2020-09-26 ENCOUNTER — Other Ambulatory Visit: Payer: Self-pay

## 2020-09-26 ENCOUNTER — Other Ambulatory Visit: Payer: Self-pay | Admitting: Family Medicine

## 2020-09-26 ENCOUNTER — Encounter: Payer: Self-pay | Admitting: Family Medicine

## 2020-09-26 VITALS — BP 126/58 | HR 79 | Temp 97.3°F | Ht 62.0 in | Wt 163.0 lb

## 2020-09-26 DIAGNOSIS — R102 Pelvic and perineal pain: Secondary | ICD-10-CM | POA: Diagnosis not present

## 2020-09-26 DIAGNOSIS — R29898 Other symptoms and signs involving the musculoskeletal system: Secondary | ICD-10-CM

## 2020-09-26 DIAGNOSIS — N390 Urinary tract infection, site not specified: Secondary | ICD-10-CM | POA: Diagnosis not present

## 2020-09-26 LAB — URINALYSIS, ROUTINE W REFLEX MICROSCOPIC
Bilirubin Urine: NEGATIVE
Glucose, UA: NEGATIVE
Hgb urine dipstick: NEGATIVE
Hyaline Cast: NONE SEEN /LPF
Ketones, ur: NEGATIVE
Nitrite: NEGATIVE
Protein, ur: NEGATIVE
RBC / HPF: NONE SEEN /HPF (ref 0–2)
Specific Gravity, Urine: 1.015 (ref 1.001–1.03)
pH: 5.5 (ref 5.0–8.0)

## 2020-09-26 LAB — MICROSCOPIC MESSAGE

## 2020-09-26 MED ORDER — SULFAMETHOXAZOLE-TRIMETHOPRIM 800-160 MG PO TABS: 1.0000 | ORAL_TABLET | Freq: Two times a day (BID) | ORAL | 0 refills | Status: DC

## 2020-09-26 MED ORDER — CEPHALEXIN 500 MG PO CAPS
500.0000 mg | ORAL_CAPSULE | Freq: Every day | ORAL | 3 refills | Status: DC
Start: 2020-09-26 — End: 2021-01-17

## 2020-09-26 NOTE — Progress Notes (Signed)
Subjective:    Patient ID: Renee [name], female    DOB: 04/13/1929, 87 y.o.   MRN: [id number]  HPI Patient is here today with her sister. Sister is concerned that she has a bladder infection. She reports 2-day history of low back pain. Urinalysis today shows only trace leukocyte esterase but no blood and no nitrates. However she has been hospitalized 2 separate times recently for urinary tract infection and therefore family is very concerned. However on exam today, the patient is exquisitely tender to palpation over the iliac crest on the left-hand side and the right-hand side. It is definitely a skeletal pain that the patient is experiencing. She states that the pain is worse when she turns over in bed or tries to change positions in a chair. She denies any falls. However she I am able to reproduce the pain exactly by pressing on the iliac crest on both sides. She denies any pain in the hip joint and she is able to bear weight on her legs without difficulty or pain in the hip. Family has discussed with the patient and they would like to start a daily antibiotic to prevent urinary tract infections given how sick she is gotten recently with him. Past Medical History:  Diagnosis Date  . Angioedema    possibly from voltaren  . Bronchopneumonia 12/11/2016  . Degenerative disc disease   . Diabetes mellitus    type II  . Hyperlipidemia   . Hypertension   . LVH (left ventricular hypertrophy)    and atrial enlargement by echo in past with nl EF  . Nasal pruritis   . Osteoarthritis   . Osteopenia   . Renal insufficiency   . Sleep apnea   . Stroke [facility]) 05/2010   Small vessel sobcortical (in [place] trial) with Dr Leonie Man, residual L hemiparesis  . Vitamin B 12 deficiency 04/08   Past Surgical History:  Procedure Laterality Date  . ABDOMINAL HYSTERECTOMY     BSO-fibroids  . APPENDECTOMY    . BACK SURGERY    . COLON SURGERY     due to punctured intestines  . EYE SURGERY     cataract  extraction  . IR THORACENTESIS ASP PLEURAL SPACE W/IMG GUIDE  06/07/2020  . KNEE SURGERY     arthroscope  . PARS PLANA VITRECTOMY  07/31/2011   Procedure: PARS PLANA VITRECTOMY WITH 25 GAUGE;  Surgeon: [name], MD;  Location: [place];  Service: Ophthalmology;  Laterality: Right;  REMOVAL OF SILICONE OIL AND LASER RIGHT EYE  . RETINAL DETACHMENT SURGERY  02/18/11   times 2  . SPINE SURGERY  08/09   spinal decompression surgery   Current Outpatient Medications on File Prior to Visit  Medication Sig Dispense Refill  . albuterol (VENTOLIN HFA) 108 (90 Base) MCG/ACT inhaler Inhale 2 puffs into the lungs every 4 (four) hours as needed for wheezing or shortness of breath. 18 g 3  . ALPRAZolam (XANAX) 0.5 MG tablet TAKE 1/2 TO 1 TABLET BY MOUTH TWICE A DAY AS NEEDED FOR ANXIETY 60 tablet 2  . amLODipine (NORVASC) 10 MG tablet TAKE 1 TABLET BY MOUTH EVERY DAY 90 tablet 3  . Blood Glucose Monitoring Suppl (ONE TOUCH ULTRA 2) w/Device KIT Check blood sugar once daily and as directed. Dx E11.9 1 each 0  . calcium-vitamin D (OSCAL WITH D) 500-200 MG-UNIT per tablet Take 1 tablet by mouth daily.    . Cholecalciferol (VITAMIN D) 1000 UNITS capsule Take 1,000 Units by  mouth daily.    . Cranberry 500 MG TABS Take 500 mg by mouth in the morning and at bedtime.    . docusate sodium (COLACE) 100 MG capsule Take 200 mg by mouth at bedtime.    . ferrous sulfate 325 (65 FE) MG tablet Take 325 mg by mouth 2 (two) times daily.    . furosemide (LASIX) 20 MG tablet TAKE 1/2 TABLET BY MOUTH DAILY 30 tablet 3  . gemfibrozil (LOPID) 600 MG tablet TAKE 1 TABLET BY MOUTH EVERY DAY 90 tablet 1  . glipiZIDE (GLUCOTROL XL) 2.5 MG 24 hr tablet TAKE 1 TABLET BY MOUTH EVERY DAY WITH BREAKFAST 90 tablet 3  . hydrALAZINE (APRESOLINE) 100 MG tablet TAKE 1 TABLET BY MOUTH THREE TIMES DAILY 270 tablet 3  . Lancets (ONETOUCH DELICA PLUS [id number]) MISC CHECK BLOOD SUGAR ONCE DAILY AND AS DIRECTED. DX E11.9 100 each 2  .  meclizine (ANTIVERT) 25 MG tablet Take 1 tablet (25 mg total) by mouth 3 (three) times daily as needed for dizziness. 30 tablet 0  . metoprolol succinate (TOPROL-XL) 25 MG 24 hr tablet Take 0.5 tablets (12.5 mg total) by mouth 2 (two) times daily. 90 tablet 3  . mirtazapine (REMERON) 15 MG tablet TAKE 1/2 TABLET BY MOUTH AT BEDTIME 45 tablet 3  . Multiple Vitamin (MULTIVITAMIN) capsule Take 1 capsule by mouth daily.    . Multiple Vitamins-Minerals (PRESERVISION AREDS 2+MULTI VIT) CAPS Take 1 capsule by mouth in the morning and at bedtime.    . Olopatadine HCl (PATADAY OP) Place 1 drop into both eyes daily.    Glory Rosebush ULTRA test strip CHECK BLOOD SUGAR ONCE DAILY AND AS DIRECTED. DX E11.9 100 strip 1  . sertraline (ZOLOFT) 50 MG tablet TAKE 1 TABLET BY MOUTH EVERY DAY 90 tablet 1  . sitaGLIPtin (JANUVIA) 100 MG tablet Take 1 tablet (100 mg total) by mouth daily. 30 tablet 2  . trandolapril (MAVIK) 4 MG tablet Take 1 tablet (4 mg total) by mouth daily. 90 tablet 3  . triamcinolone (NASACORT) 55 MCG/ACT AERO nasal inhaler Place 2 sprays into the nose daily. (Patient taking differently: Place 2 sprays into the nose at bedtime.) 1 each 5  . vitamin B-12 (CYANOCOBALAMIN) 1000 MCG tablet Take 1,000 mcg by mouth daily.      No current facility-administered medications on file prior to visit.   Allergies  Allergen Reactions  . Diclofenac Sodium Other (See Comments)    REACTION: angioedema  . Pioglitazone Swelling   Social History   Socioeconomic History  . Marital status: Widowed    Spouse name: Not on file  . Number of children: Not on file  . Years of education: Not on file  . Highest education level: Not on file  Occupational History  . Not on file  Tobacco Use  . Smoking status: Never Smoker  . Smokeless tobacco: Never Used  Vaping Use  . Vaping Use: Not on file  Substance and Sexual Activity  . Alcohol use: No    Alcohol/week: 0.0 standard drinks  . Drug use: No  . Sexual  activity: Never  Other Topics Concern  . Not on file  Social History Narrative  . Not on file   Social Determinants of Health   Financial Resource Strain: Not on file  Food Insecurity: Not on file  Transportation Needs: Not on file  Physical Activity: Not on file  Stress: Not on file  Social Connections: Not on file  Intimate Partner Violence: Not  on file      Review of Systems  Genitourinary: Positive for frequency.  Musculoskeletal: Positive for back pain.  All other systems reviewed and are negative.      Objective:   Physical Exam Vitals reviewed.  Constitutional:      General: She is not in acute distress.    Appearance: Normal appearance. She is obese. She is not ill-appearing, toxic-appearing or diaphoretic.  HENT:     Right Ear: Tympanic membrane and ear canal normal.     Left Ear: Tympanic membrane and ear canal normal.     Nose: Nose normal. No congestion or rhinorrhea.  Cardiovascular:     Rate and Rhythm: Normal rate and regular rhythm.     Heart sounds: Normal heart sounds. No murmur heard. No friction rub. No gallop.   Pulmonary:     Effort: Pulmonary effort is normal. No respiratory distress.     Breath sounds: Normal breath sounds. No stridor. No wheezing, rhonchi or rales.  Musculoskeletal:     Right lower leg: No edema.     Left lower leg: No edema.  Neurological:     Mental Status: She is alert and oriented to person, place, and time. Mental status is at baseline.     Sensory: No sensory deficit.     Motor: Weakness present.     Coordination: Coordination abnormal.     Gait: Gait abnormal.   Chronic left facial droop due to her previous remote right basal ganglial infarct        Assessment & Plan:  Frequent UTI - Plan: Urine Culture, Urinalysis, Routine w reflex microscopic  Pelvic pain - Plan: DG Hip Unilat W OR W/O Pelvis 2-3 Views Left  I believe the pelvic pain likely represents an injury to the pelvic bone itself. Patient tends  to fall down in her chair when she sits down and I am curious if she may have cracked part of the pelvis. Therefore I want to get an x-ray immediately. Examination of the hip is relatively normal. The pain does not appear to be coming from a bladder infection. She does have some trace leukocyte esterase we could simply just be contaminant. However the patient wants to aggressively treat this due to her recent history and therefore I will treat with Bactrim double strength tablets twice daily for 3 days. They have also decided to start taking a daily antibiotic Keflex 500 mg a day for UTI prophylaxis. We discussed the risks and benefits of this including resistant infections, yeast infections etc. and the patient is willing to accept that risk. I will schedule the patient for home health physical therapy as she has shown progressive weakness and deconditioning and the family is requesting that we order this

## 2020-09-26 NOTE — Telephone Encounter (Signed)
Received the following MyChart message from patient sister in law, Renee [name]: Renee [name] would like to make sure that Renee [name] has her on [facility].   Would you please send me a message or call me asap. She is requesting this information. Thank you.  Renee [name] [phone]. I am her sister in law   Call placed to patient sister in law Renee [name] to inquire.   States that she is unsure, but gave information to contact patient caregiver Renee [name] [phone]~ telephone.   Renee [name] reports that patient was receiving [facility] services from [facility] from prior PCP, Dr. Marcello Moores. States that she has been trying to contact [facility] SN to come and obtain urine sample as she is having B flank pain and is more confused.   Advised that patient will need to be seen. Appointment scheduled for today.

## 2020-09-27 DIAGNOSIS — E1165 Type 2 diabetes mellitus with hyperglycemia: Secondary | ICD-10-CM | POA: Diagnosis not present

## 2020-09-27 DIAGNOSIS — N1832 Chronic kidney disease, stage 3b: Secondary | ICD-10-CM | POA: Diagnosis not present

## 2020-09-27 DIAGNOSIS — E785 Hyperlipidemia, unspecified: Secondary | ICD-10-CM | POA: Diagnosis not present

## 2020-09-27 DIAGNOSIS — E1122 Type 2 diabetes mellitus with diabetic chronic kidney disease: Secondary | ICD-10-CM | POA: Diagnosis not present

## 2020-09-27 DIAGNOSIS — R69 Illness, unspecified: Secondary | ICD-10-CM | POA: Diagnosis not present

## 2020-09-27 DIAGNOSIS — D631 Anemia in chronic kidney disease: Secondary | ICD-10-CM | POA: Diagnosis not present

## 2020-09-27 DIAGNOSIS — I1310 Hypertensive heart and chronic kidney disease without heart failure, with stage 1 through stage 4 chronic kidney disease, or unspecified chronic kidney disease: Secondary | ICD-10-CM | POA: Diagnosis not present

## 2020-09-27 DIAGNOSIS — N390 Urinary tract infection, site not specified: Secondary | ICD-10-CM | POA: Diagnosis not present

## 2020-09-27 DIAGNOSIS — I69354 Hemiplegia and hemiparesis following cerebral infarction affecting left non-dominant side: Secondary | ICD-10-CM | POA: Diagnosis not present

## 2020-09-28 ENCOUNTER — Encounter: Payer: Self-pay | Admitting: Family Medicine

## 2020-09-28 LAB — URINE CULTURE
MICRO NUMBER:: 11502978
SPECIMEN QUALITY:: ADEQUATE

## 2020-09-28 MED ORDER — SITAGLIPTIN PHOSPHATE 100 MG PO TABS: 100.0000 mg | ORAL_TABLET | Freq: Every day | ORAL | 3 refills | Status: DC

## 2020-09-28 MED ORDER — METOPROLOL SUCCINATE ER 25 MG PO TB24: 12.5000 mg | ORAL_TABLET | Freq: Two times a day (BID) | ORAL | 3 refills | Status: DC

## 2020-09-28 MED ORDER — SERTRALINE HCL 50 MG PO TABS: 50.0000 mg | ORAL_TABLET | Freq: Every day | ORAL | 3 refills | Status: DC

## 2020-09-28 MED ORDER — AMLODIPINE BESYLATE 10 MG PO TABS: 10.0000 mg | ORAL_TABLET | Freq: Every day | ORAL | 3 refills | Status: DC

## 2020-09-28 MED ORDER — GEMFIBROZIL 600 MG PO TABS: 600.0000 mg | ORAL_TABLET | Freq: Every day | ORAL | 3 refills | Status: DC

## 2020-09-28 MED ORDER — HYDRALAZINE HCL 100 MG PO TABS: 100.0000 mg | ORAL_TABLET | Freq: Three times a day (TID) | ORAL | 3 refills | Status: DC

## 2020-09-28 MED ORDER — TRANDOLAPRIL 4 MG PO TABS: 4.0000 mg | ORAL_TABLET | Freq: Every day | ORAL | 3 refills | Status: DC

## 2020-09-28 MED ORDER — GLIPIZIDE ER 2.5 MG PO TB24: ORAL_TABLET | ORAL | 3 refills | Status: DC

## 2020-09-29 ENCOUNTER — Ambulatory Visit
Admission: RE | Admit: 2020-09-29 | Discharge: 2020-09-29 | Disposition: A | Discharge status: Home / Self Care | Payer: Medicare HMO | Source: Ambulatory Visit | Attending: Family Medicine | Admitting: Family Medicine

## 2020-09-29 DIAGNOSIS — M47816 Spondylosis without myelopathy or radiculopathy, lumbar region: Secondary | ICD-10-CM | POA: Diagnosis not present

## 2020-09-29 DIAGNOSIS — M1612 Unilateral primary osteoarthritis, left hip: Secondary | ICD-10-CM | POA: Diagnosis not present

## 2020-09-29 DIAGNOSIS — N390 Urinary tract infection, site not specified: Secondary | ICD-10-CM

## 2020-09-29 DIAGNOSIS — R102 Pelvic and perineal pain: Secondary | ICD-10-CM

## 2020-09-29 DIAGNOSIS — M8588 Other specified disorders of bone density and structure, other site: Secondary | ICD-10-CM | POA: Diagnosis not present

## 2020-09-30 ENCOUNTER — Encounter: Payer: Self-pay | Admitting: Family Medicine

## 2020-10-03 DIAGNOSIS — N3020 Other chronic cystitis without hematuria: Secondary | ICD-10-CM | POA: Diagnosis not present

## 2020-10-03 DIAGNOSIS — N3941 Urge incontinence: Secondary | ICD-10-CM | POA: Diagnosis not present

## 2020-10-03 DIAGNOSIS — R3915 Urgency of urination: Secondary | ICD-10-CM | POA: Diagnosis not present

## 2020-10-03 DIAGNOSIS — N281 Cyst of kidney, acquired: Secondary | ICD-10-CM | POA: Diagnosis not present

## 2020-10-04 DIAGNOSIS — I1310 Hypertensive heart and chronic kidney disease without heart failure, with stage 1 through stage 4 chronic kidney disease, or unspecified chronic kidney disease: Secondary | ICD-10-CM | POA: Diagnosis not present

## 2020-10-04 DIAGNOSIS — D631 Anemia in chronic kidney disease: Secondary | ICD-10-CM | POA: Diagnosis not present

## 2020-10-04 DIAGNOSIS — N1832 Chronic kidney disease, stage 3b: Secondary | ICD-10-CM | POA: Diagnosis not present

## 2020-10-04 DIAGNOSIS — N390 Urinary tract infection, site not specified: Secondary | ICD-10-CM | POA: Diagnosis not present

## 2020-10-04 DIAGNOSIS — R69 Illness, unspecified: Secondary | ICD-10-CM | POA: Diagnosis not present

## 2020-10-04 DIAGNOSIS — E1122 Type 2 diabetes mellitus with diabetic chronic kidney disease: Secondary | ICD-10-CM | POA: Diagnosis not present

## 2020-10-04 DIAGNOSIS — E1165 Type 2 diabetes mellitus with hyperglycemia: Secondary | ICD-10-CM | POA: Diagnosis not present

## 2020-10-04 DIAGNOSIS — I69354 Hemiplegia and hemiparesis following cerebral infarction affecting left non-dominant side: Secondary | ICD-10-CM | POA: Diagnosis not present

## 2020-10-04 DIAGNOSIS — E785 Hyperlipidemia, unspecified: Secondary | ICD-10-CM | POA: Diagnosis not present

## 2020-10-14 ENCOUNTER — Ambulatory Visit (INDEPENDENT_AMBULATORY_CARE_PROVIDER_SITE_OTHER): Payer: Medicare HMO

## 2020-10-14 ENCOUNTER — Other Ambulatory Visit: Payer: Self-pay

## 2020-10-14 ENCOUNTER — Ambulatory Visit: Payer: Medicare HMO | Admitting: Pulmonary Disease

## 2020-10-14 ENCOUNTER — Ambulatory Visit: Payer: Medicare HMO

## 2020-10-14 ENCOUNTER — Encounter: Payer: Self-pay | Admitting: Pulmonary Disease

## 2020-10-14 VITALS — BP 126/84 | HR 54 | Temp 97.4°F

## 2020-10-14 DIAGNOSIS — J90 Pleural effusion, not elsewhere classified: Secondary | ICD-10-CM | POA: Diagnosis not present

## 2020-10-14 DIAGNOSIS — I517 Cardiomegaly: Secondary | ICD-10-CM | POA: Diagnosis not present

## 2020-10-14 DIAGNOSIS — J189 Pneumonia, unspecified organism: Secondary | ICD-10-CM

## 2020-10-14 NOTE — Patient Instructions (Signed)
Can follow up as needed if you develop new breathing symptoms

## 2020-10-14 NOTE — Progress Notes (Signed)
Renee Renee [name], Renee Renee [name], Renee Renee [name]  Chief Complaint  Patient presents with  . Follow-up    No complaints.     Constitutional:  BP 126/84 (BP Location: Left Arm, Cuff Size: Normal)   Pulse (!) 54   Temp (!) 97.4 F (36.3 C)   SpO2 98%   Past Medical History:  Angioedema from voltaren, Pneumonia 2018, DJD, DM type 2, HLD, HTN, OA, CVA, Vit B 12 deficiency, CKD 3b  Past Surgical History:  She  has a past surgical history that includes Eye surgery; Abdominal hysterectomy; Knee surgery; Spine surgery (08/09); Retinal detachment surgery (02/18/11); Back surgery; Colon surgery; Appendectomy; Pars plana vitrectomy (07/31/2011); Renee IR THORACENTESIS ASP PLEURAL SPACE W/IMG GUIDE (06/07/2020).  Brief Summary:  Renee Renee [name] is a 87 y.o. female with pleural effusion.      Subjective:   She is here with her family.  She had chest xray today.  Looks better.  Not having cough, chest pain, fever, sputum, or wheeze.  Physical Exam:   Appearance - in wheelchair  ENMT - no sinus tenderness, no oral exudate, no LAN, Mallampati 2 airway, no stridor  Respiratory - equal breath sounds bilaterally, no wheezing or rales  CV - s1s2 regular rate Renee rhythm, no murmurs  Ext - no clubbing, no edema  Skin - no rashes  Psych - normal mood Renee affect   Renee [name] testing:   Lt thoracentesis 06/07/20 >> 450 ml, glucose 293, protein < 3, LDH 62, WBC 1550 (51N, 37M, 21L), culture Renee cytology negative  Chest Imaging:   CT chest 05/21/20 >> small pericardial effusion, atherosclerosis, small/mod Lt pleural effusion, granuloma LUL, small HH  Cardiac Tests:   Echo 06/09/20 >> EF 65 to 70%, grade 1 DD  Social History:  She  reports that she has never smoked. She has never used smokeless tobacco. She reports that she does not drink alcohol Renee does not use drugs.  Family History:  Her family history includes COPD in her brother; Cancer in her sister; Heart disease in  her sister.     Assessment/Plan:   Transudate Lt pleural effusion with diastolic CHF. - chest xray today looks much better - no significant respiratory symptoms - can follow up with Renee [name] as needed   CKD 3b. - followed by Dr. Anthonette Legato with [facility]  Time Spent Involved in Patient Renee [name] on Day of Examination:  22 minutes  Follow up:  Patient Instructions  Can follow up as needed if you develop new breathing symptoms   Medication List:   Allergies as of 10/14/2020      Reactions   Diclofenac Sodium Other (See Comments)   REACTION: angioedema   Pioglitazone Swelling      Medication List       Accurate as of October 14, 2020  2:41 PM. If you have any questions, ask your nurse or doctor.        albuterol 108 (90 Base) MCG/ACT inhaler Commonly known as: VENTOLIN HFA Inhale 2 puffs into the lungs every 4 (four) hours as needed for wheezing or shortness of breath.   ALPRAZolam 0.5 MG tablet Commonly known as: XANAX TAKE 1/2 TO 1 TABLET BY MOUTH TWICE A DAY AS NEEDED FOR ANXIETY   amLODipine 10 MG tablet Commonly known as: NORVASC Take 1 tablet (10 mg total) by mouth daily.   calcium-vitamin D 500-200 MG-UNIT tablet Commonly known as: OSCAL WITH D Take 1 tablet by mouth daily.   cephALEXin 500  MG capsule Commonly known as: KEFLEX Take 1 capsule (500 mg total) by mouth daily.   Cranberry 500 MG Tabs Take 500 mg by mouth in the morning Renee at bedtime.   docusate sodium 100 MG capsule Commonly known as: COLACE Take 200 mg by mouth at bedtime.   ferrous sulfate 325 (65 FE) MG tablet Take 325 mg by mouth 2 (two) times daily.   furosemide 20 MG tablet Commonly known as: LASIX TAKE 1/2 TABLET BY MOUTH DAILY   gemfibrozil 600 MG tablet Commonly known as: LOPID Take 1 tablet (600 mg total) by mouth daily.   glipiZIDE 2.5 MG 24 hr tablet Commonly known as: GLUCOTROL XL TAKE 1 TABLET BY MOUTH EVERY DAY WITH BREAKFAST   hydrALAZINE  100 MG tablet Commonly known as: APRESOLINE Take 1 tablet (100 mg total) by mouth 3 (three) times daily.   meclizine 25 MG tablet Commonly known as: ANTIVERT Take 1 tablet (25 mg total) by mouth 3 (three) times daily as needed for dizziness.   metoprolol succinate 25 MG 24 hr tablet Commonly known as: TOPROL-XL Take 0.5 tablets (12.5 mg total) by mouth 2 (two) times daily.   mirtazapine 15 MG tablet Commonly known as: REMERON TAKE 1/2 TABLET BY MOUTH AT BEDTIME   multivitamin capsule Take 1 capsule by mouth daily.   ONE TOUCH ULTRA 2 w/Device Kit Check blood sugar once daily Renee as directed. Dx Q75.9   OneTouch Delica Plus [id number] Misc CHECK BLOOD SUGAR ONCE DAILY Renee AS DIRECTED. DX E11.9   OneTouch Ultra test strip Generic drug: glucose blood CHECK BLOOD SUGAR ONCE DAILY Renee AS DIRECTED. DX E11.9   PATADAY OP Place 1 drop into both eyes daily.   PreserVision AREDS 2+Multi Vit Caps Take 1 capsule by mouth in the morning Renee at bedtime.   sertraline 50 MG tablet Commonly known as: ZOLOFT Take 1 tablet (50 mg total) by mouth daily.   sitaGLIPtin 100 MG tablet Commonly known as: Januvia Take 1 tablet (100 mg total) by mouth daily.   sulfamethoxazole-trimethoprim 800-160 MG tablet Commonly known as: BACTRIM DS Take 1 tablet by mouth 2 (two) times daily.   trandolapril 4 MG tablet Commonly known as: MAVIK Take 1 tablet (4 mg total) by mouth daily.   triamcinolone 55 MCG/ACT Aero nasal inhaler Commonly known as: NASACORT Place 2 sprays into the nose daily. What changed: when to take this   vitamin B-12 1000 MCG tablet Commonly known as: CYANOCOBALAMIN Take 1,000 mcg by mouth daily.   Vitamin D 1000 units capsule Take 1,000 Units by mouth daily.       Signature:  Chesley Mires, MD [place] Pager - [phone] 10/14/2020, 2:41 PM

## 2020-10-18 ENCOUNTER — Ambulatory Visit (INDEPENDENT_AMBULATORY_CARE_PROVIDER_SITE_OTHER): Payer: Medicare HMO | Admitting: Family Medicine

## 2020-10-18 ENCOUNTER — Other Ambulatory Visit: Payer: Self-pay

## 2020-10-18 ENCOUNTER — Encounter: Payer: Self-pay | Admitting: Family Medicine

## 2020-10-18 VITALS — BP 130/67 | HR 60 | Temp 97.7°F | Resp 18 | Ht 62.0 in | Wt 163.0 lb

## 2020-10-18 DIAGNOSIS — E1165 Type 2 diabetes mellitus with hyperglycemia: Secondary | ICD-10-CM | POA: Diagnosis not present

## 2020-10-18 DIAGNOSIS — N390 Urinary tract infection, site not specified: Secondary | ICD-10-CM | POA: Diagnosis not present

## 2020-10-18 MED ORDER — GLIPIZIDE ER 5 MG PO TB24: 5.0000 mg | ORAL_TABLET | Freq: Every day | ORAL | 3 refills | Status: DC

## 2020-10-18 NOTE — Progress Notes (Signed)
Subjective:    Patient ID: Renee [name], female    DOB: 1929/08/03, 87 y.o.   MRN: [id number]  09/26/20 Patient is here today with her sister. Sister is concerned that she has a bladder infection. She reports 2-day history of low back pain. Urinalysis today shows only trace leukocyte esterase but no blood and no nitrates. However she has been hospitalized 2 separate times recently for urinary tract infection and therefore family is very concerned. However on exam today, the patient is exquisitely tender to palpation over the iliac crest on the left-hand side and the right-hand side. It is definitely a skeletal pain that the patient is experiencing. She states that the pain is worse when she turns over in bed or tries to change positions in a chair. She denies any falls. However she I am able to reproduce the pain exactly by pressing on the iliac crest on both sides. She denies any pain in the hip joint and she is able to bear weight on her legs without difficulty or pain in the hip. Family has discussed with the patient and they would like to start a daily antibiotic to prevent urinary tract infections given how sick she is gotten recently with him.  At that time, my plan was: I believe the pelvic pain likely represents an injury to the pelvic bone itself. Patient tends to fall down in her chair when she sits down and I am curious if she may have cracked part of the pelvis. Therefore I want to get an x-ray immediately. Examination of the hip is relatively normal. The pain does not appear to be coming from a bladder infection. She does have some trace leukocyte esterase we could simply just be contaminant. However the patient wants to aggressively treat this due to her recent history and therefore I will treat with Bactrim double strength tablets twice daily for 3 days. They have also decided to start taking a daily antibiotic Keflex 500 mg a day for UTI prophylaxis. We discussed the risks and benefits of  this including resistant infections, yeast infections etc. and the patient is willing to accept that risk. I will schedule the patient for home health physical therapy as she has shown progressive weakness and deconditioning and the family is requesting that we order this  10/18/20 Urine culture on 2 7 grew Klebsiella that was resistant to Keflex but sensitive to Bactrim.  Therefore the Bactrim should have treated the initial infection.  She denies any dysuria or urgency or frequency.  The back pain that she was experiencing at her last visit has resolved.  She is checking her blood sugar.  She is currently on glipizide extended release 5 mg a day and Januvia 100 mg a day.  Her fasting blood sugars are averaging between 140 and 170 every morning.  She is not checking any 2-hour postprandial sugars.  She denies any sugar reading above 200 in the last several weeks.  She denies any polyuria polydipsia or blurry vision.  She denies any chest pain or shortness of breath or dyspnea on exertion.  Her hemoglobin A1c at her last office visit was 7.9. Past Medical History:  Diagnosis Date  . Angioedema    possibly from voltaren  . Bronchopneumonia 12/11/2016  . Degenerative disc disease   . Diabetes mellitus    type II  . Hyperlipidemia   . Hypertension   . LVH (left ventricular hypertrophy)    and atrial enlargement by echo in past with nl  EF  . Nasal pruritis   . Osteoarthritis   . Osteopenia   . Renal insufficiency   . Sleep apnea   . Stroke [facility]) 05/2010   Small vessel sobcortical (in [place] trial) with Dr Leonie Man, residual L hemiparesis  . Vitamin B 12 deficiency 04/08   Past Surgical History:  Procedure Laterality Date  . ABDOMINAL HYSTERECTOMY     BSO-fibroids  . APPENDECTOMY    . BACK SURGERY    . COLON SURGERY     due to punctured intestines  . EYE SURGERY     cataract extraction  . IR THORACENTESIS ASP PLEURAL SPACE W/IMG GUIDE  06/07/2020  . KNEE SURGERY     arthroscope  . PARS  PLANA VITRECTOMY  07/31/2011   Procedure: PARS PLANA VITRECTOMY WITH 25 GAUGE;  Surgeon: [name], MD;  Location: [place];  Service: Ophthalmology;  Laterality: Right;  REMOVAL OF SILICONE OIL AND LASER RIGHT EYE  . RETINAL DETACHMENT SURGERY  02/18/11   times 2  . SPINE SURGERY  08/09   spinal decompression surgery   Current Outpatient Medications on File Prior to Visit  Medication Sig Dispense Refill  . amLODipine (NORVASC) 10 MG tablet Take 1 tablet (10 mg total) by mouth daily. 90 tablet 3  . Blood Glucose Monitoring Suppl (ONE TOUCH ULTRA 2) w/Device KIT Check blood sugar once daily and as directed. Dx E11.9 1 each 0  . calcium-vitamin D (OSCAL WITH D) 500-200 MG-UNIT per tablet Take 1 tablet by mouth daily.    . cephALEXin (KEFLEX) 500 MG capsule Take 1 capsule (500 mg total) by mouth daily. 30 capsule 3  . Cholecalciferol (VITAMIN D) 1000 UNITS capsule Take 1,000 Units by mouth daily.    Marland Kitchen docusate sodium (COLACE) 100 MG capsule Take 200 mg by mouth at bedtime.    . furosemide (LASIX) 20 MG tablet TAKE 1/2 TABLET BY MOUTH DAILY 30 tablet 3  . glipiZIDE (GLUCOTROL XL) 2.5 MG 24 hr tablet TAKE 1 TABLET BY MOUTH EVERY DAY WITH BREAKFAST (Patient taking differently: Take by mouth. TAKE 2 TABLET BY MOUTH EVERY DAY WITH BREAKFAST) 90 tablet 3  . hydrALAZINE (APRESOLINE) 100 MG tablet Take 1 tablet (100 mg total) by mouth 3 (three) times daily. 270 tablet 3  . Lancets (ONETOUCH DELICA PLUS [id number]) MISC CHECK BLOOD SUGAR ONCE DAILY AND AS DIRECTED. DX E11.9 100 each 2  . metoprolol succinate (TOPROL-XL) 25 MG 24 hr tablet Take 0.5 tablets (12.5 mg total) by mouth 2 (two) times daily. 90 tablet 3  . Multiple Vitamin (MULTIVITAMIN) capsule Take 1 capsule by mouth daily.    . Multiple Vitamins-Minerals (PRESERVISION AREDS 2+MULTI VIT) CAPS Take 1 capsule by mouth in the morning and at bedtime.    . Olopatadine HCl (PATADAY OP) Place 1 drop into both eyes daily.    Glory Rosebush ULTRA test strip  CHECK BLOOD SUGAR ONCE DAILY AND AS DIRECTED. DX E11.9 100 strip 1  . sitaGLIPtin (JANUVIA) 100 MG tablet Take 1 tablet (100 mg total) by mouth daily. 90 tablet 3  . trandolapril (MAVIK) 4 MG tablet Take 1 tablet (4 mg total) by mouth daily. 90 tablet 3  . triamcinolone (NASACORT) 55 MCG/ACT AERO nasal inhaler Place 2 sprays into the nose daily. (Patient taking differently: Place 2 sprays into the nose at bedtime.) 1 each 5  . vitamin B-12 (CYANOCOBALAMIN) 1000 MCG tablet Take 1,000 mcg by mouth daily.     Marland Kitchen albuterol (VENTOLIN HFA) 108 (90 Base)  MCG/ACT inhaler Inhale 2 puffs into the lungs every 4 (four) hours as needed for wheezing or shortness of breath. 18 g 3  . ALPRAZolam (XANAX) 0.5 MG tablet TAKE 1/2 TO 1 TABLET BY MOUTH TWICE A DAY AS NEEDED FOR ANXIETY 60 tablet 2  . Cranberry 500 MG TABS Take 500 mg by mouth in the morning and at bedtime.    . ferrous sulfate 325 (65 FE) MG tablet Take 325 mg by mouth 2 (two) times daily. (Patient not taking: Reported on 10/18/2020)    . gemfibrozil (LOPID) 600 MG tablet Take 1 tablet (600 mg total) by mouth daily. (Patient not taking: Reported on 10/18/2020) 90 tablet 3  . meclizine (ANTIVERT) 25 MG tablet Take 1 tablet (25 mg total) by mouth 3 (three) times daily as needed for dizziness. (Patient not taking: Reported on 10/18/2020) 30 tablet 0  . mirtazapine (REMERON) 15 MG tablet TAKE 1/2 TABLET BY MOUTH AT BEDTIME 45 tablet 3  . sertraline (ZOLOFT) 50 MG tablet Take 1 tablet (50 mg total) by mouth daily. 90 tablet 3  . sulfamethoxazole-trimethoprim (BACTRIM DS) 800-160 MG tablet Take 1 tablet by mouth 2 (two) times daily. (Patient not taking: Reported on 10/18/2020) 6 tablet 0   No current facility-administered medications on file prior to visit.   Allergies  Allergen Reactions  . Diclofenac Sodium Other (See Comments)    REACTION: angioedema  . Pioglitazone Swelling   Social History   Socioeconomic History  . Marital status: Widowed    Spouse  name: Not on file  . Number of children: Not on file  . Years of education: Not on file  . Highest education level: Not on file  Occupational History  . Not on file  Tobacco Use  . Smoking status: Never Smoker  . Smokeless tobacco: Never Used  Vaping Use  . Vaping Use: Not on file  Substance and Sexual Activity  . Alcohol use: No    Alcohol/week: 0.0 standard drinks  . Drug use: No  . Sexual activity: Never  Other Topics Concern  . Not on file  Social History Narrative  . Not on file   Social Determinants of Health   Financial Resource Strain: Not on file  Food Insecurity: Not on file  Transportation Needs: Not on file  Physical Activity: Not on file  Stress: Not on file  Social Connections: Not on file  Intimate Partner Violence: Not on file      Review of Systems  Genitourinary: Positive for frequency.  Musculoskeletal: Positive for back pain.  All other systems reviewed and are negative.      Objective:   Physical Exam Vitals reviewed.  Constitutional:      General: She is not in acute distress.    Appearance: Normal appearance. She is obese. She is not ill-appearing, toxic-appearing or diaphoretic.  HENT:     Right Ear: Tympanic membrane and ear canal normal.     Left Ear: Tympanic membrane and ear canal normal.     Nose: Nose normal. No congestion or rhinorrhea.  Cardiovascular:     Rate and Rhythm: Normal rate and regular rhythm.     Heart sounds: Normal heart sounds. No murmur heard. No friction rub. No gallop.   Pulmonary:     Effort: Pulmonary effort is normal. No respiratory distress.     Breath sounds: Normal breath sounds. No stridor. No wheezing, rhonchi or rales.  Musculoskeletal:     Right lower leg: No edema.  Left lower leg: No edema.  Neurological:     Mental Status: She is alert and oriented to person, place, and time. Mental status is at baseline.     Sensory: No sensory deficit.     Motor: Weakness present.     Coordination:  Coordination abnormal.     Gait: Gait abnormal.   Chronic left facial droop due to her previous remote right basal ganglial infarct        Assessment & Plan:  Frequent UTI - Plan: Urinalysis, Routine w reflex microscopic, Urine Culture  Type 2 diabetes mellitus with hyperglycemia, without long-term current use of insulin (HCC)  Overall the patient seems to be doing much better.  We will continue Keflex as a preventative medication however I will check a urine culture today.  Her last infection was Klebsiella resistant to Keflex.  If the patient continues to have a bacteria growing in her urine, we may need to switch to a preventative medication to which it is sensitive.  Therefore I would like to see culture and sensitivities when she is asymptomatic.  Regarding her diabetes, given her advanced age, I would like to keep her blood sugar between 102 100 without having hypoglycemic episodes.  It seems that the patient is there.  Therefore I would recommend rechecking a hemoglobin A1c in 3 months however overall she seems to be doing better.

## 2020-10-19 ENCOUNTER — Other Ambulatory Visit: Payer: Self-pay | Admitting: Family Medicine

## 2020-10-19 LAB — URINE CULTURE
MICRO NUMBER:: 11592430
SPECIMEN QUALITY:: ADEQUATE

## 2020-10-19 LAB — URINALYSIS, ROUTINE W REFLEX MICROSCOPIC
Bacteria, UA: NONE SEEN /HPF
Bilirubin Urine: NEGATIVE
Glucose, UA: NEGATIVE
Hgb urine dipstick: NEGATIVE
Hyaline Cast: NONE SEEN /LPF
Leukocytes,Ua: NEGATIVE
Nitrite: NEGATIVE
RBC / HPF: NONE SEEN /HPF (ref 0–2)
Specific Gravity, Urine: 1.021 (ref 1.001–1.03)
WBC, UA: NONE SEEN /HPF (ref 0–5)
pH: 5.5 (ref 5.0–8.0)

## 2020-10-20 ENCOUNTER — Other Ambulatory Visit: Payer: Self-pay | Admitting: Family Medicine

## 2020-11-14 ENCOUNTER — Other Ambulatory Visit: Payer: Self-pay | Admitting: Family Medicine

## 2020-11-18 ENCOUNTER — Encounter: Payer: Self-pay | Admitting: Family Medicine

## 2020-11-24 ENCOUNTER — Other Ambulatory Visit: Payer: Self-pay | Admitting: Family Medicine

## 2020-11-28 ENCOUNTER — Encounter: Payer: Self-pay | Admitting: Family Medicine

## 2020-11-28 MED ORDER — ALPRAZOLAM 0.5 MG PO TABS: 0.2500 mg | ORAL_TABLET | Freq: Two times a day (BID) | ORAL | 2 refills | Status: DC | PRN

## 2020-11-28 NOTE — Telephone Encounter (Signed)
Ok to refill??  Last office visit 10/18/2020.  Last refill 08/19/2020, #2 refills by [facility]

## 2020-12-05 ENCOUNTER — Encounter: Payer: Self-pay | Admitting: Family Medicine

## 2020-12-07 ENCOUNTER — Ambulatory Visit (INDEPENDENT_AMBULATORY_CARE_PROVIDER_SITE_OTHER): Payer: Medicare HMO | Admitting: Family Medicine

## 2020-12-07 ENCOUNTER — Other Ambulatory Visit: Payer: Self-pay

## 2020-12-07 ENCOUNTER — Encounter: Payer: Self-pay | Admitting: Family Medicine

## 2020-12-07 VITALS — BP 130/68 | HR 62 | Temp 98.6°F | Resp 14 | Ht 62.0 in | Wt 165.0 lb

## 2020-12-07 DIAGNOSIS — R410 Disorientation, unspecified: Secondary | ICD-10-CM

## 2020-12-07 DIAGNOSIS — R42 Dizziness and giddiness: Secondary | ICD-10-CM | POA: Diagnosis not present

## 2020-12-07 DIAGNOSIS — R300 Dysuria: Secondary | ICD-10-CM | POA: Diagnosis not present

## 2020-12-07 LAB — URINALYSIS, ROUTINE W REFLEX MICROSCOPIC
Bilirubin Urine: NEGATIVE
Glucose, UA: NEGATIVE
Hgb urine dipstick: NEGATIVE
Ketones, ur: NEGATIVE
Leukocytes,Ua: NEGATIVE
Nitrite: NEGATIVE
Protein, ur: NEGATIVE
Specific Gravity, Urine: 1.02 (ref 1.001–1.03)
pH: 5.5 (ref 5.0–8.0)

## 2020-12-07 NOTE — Addendum Note (Signed)
Addended by: Sheral Flow on: 12/07/2020 04:39 PM   Modules accepted: Orders

## 2020-12-07 NOTE — Progress Notes (Signed)
Subjective:    Patient ID: Renee [name], female    DOB: [birth year], 87 y.o.   MRN: [id number]  09/26/20 Patient is here today with her sister. Sister is concerned that she has a bladder infection. She reports 2-day history of low back pain. Urinalysis today shows only trace leukocyte esterase but no blood and no nitrates. However she has been hospitalized 2 separate times recently for urinary tract infection and therefore family is very concerned. However on exam today, the patient is exquisitely tender to palpation over the iliac crest on the left-hand side and the right-hand side. It is definitely a skeletal pain that the patient is experiencing. She states that the pain is worse when she turns over in bed or tries to change positions in a chair. She denies any falls. However she I am able to reproduce the pain exactly by pressing on the iliac crest on both sides. She denies any pain in the hip joint and she is able to bear weight on her legs without difficulty or pain in the hip. Family has discussed with the patient and they would like to start a daily antibiotic to prevent urinary tract infections given how sick she is gotten recently with him.  At that time, my plan was: I believe the pelvic pain likely represents an injury to the pelvic bone itself. Patient tends to fall down in her chair when she sits down and I am curious if she may have cracked part of the pelvis. Therefore I want to get an x-ray immediately. Examination of the hip is relatively normal. The pain does not appear to be coming from a bladder infection. She does have some trace leukocyte esterase we could simply just be contaminant. However the patient wants to aggressively treat this due to her recent history and therefore I will treat with Bactrim double strength tablets twice daily for 3 days. They have also decided to start taking a daily antibiotic Keflex 500 mg a day for UTI prophylaxis. We discussed the risks and benefits of  this including resistant infections, yeast infections etc. and the patient is willing to accept that risk. I will schedule the patient for home health physical therapy as she has shown progressive weakness and deconditioning and the family is requesting that we order this  10/18/20 Urine culture on 2/7 grew Klebsiella that was resistant to Keflex but sensitive to Bactrim.  Therefore the Bactrim should have treated the initial infection.  She denies any dysuria or urgency or frequency.  The back pain that she was experiencing at her last visit has resolved.  She is checking her blood sugar.  She is currently on glipizide extended release 5 mg a day and Januvia 100 mg a day.  Her fasting blood sugars are averaging between 140 and 170 every morning.  She is not checking any 2-hour postprandial sugars.  She denies any sugar reading above 200 in the last several weeks.  She denies any polyuria polydipsia or blurry vision.  She denies any chest pain or shortness of breath or dyspnea on exertion.  Her hemoglobin A1c at her last office visit was 7.9.  At that time, my plan was: Overall the patient seems to be doing much better.  We will continue Keflex as a preventative medication however I will check a urine culture today.  Her last infection was Klebsiella resistant to Keflex.  If the patient continues to have a bacteria growing in her urine, we may need to switch to  a preventative medication to which it is sensitive.  Therefore I would like to see culture and sensitivities when she is asymptomatic.  Regarding her diabetes, given her advanced age, I would like to keep her blood sugar between 100-200 without having hypoglycemic episodes.  It seems that the patient is there.  Therefore I would recommend rechecking a hemoglobin A1c in 3 months however overall she seems to be doing better.  12/07/20 Urine culture 3/1 was clear of infection.  Over the last week, the patient has felt lightheaded.  She describes it as being  swimmy headed.  She feels weak.  She is also been having night sweats and hot flashes at night.  She was concerned that she may have a urinary tract infection.  Urinalysis today shows no blood, no nitrates, and no leukocyte esterase.  Therefore she does not appear to have a urinary tract infection although I will send a urine culture.  She denies any cough or chest pain or shortness of breath.  She did have an episode of diarrhea on Monday but otherwise she denies any abdominal pain nausea vomiting diarrhea.  She denies any rhinorrhea, throat sore throat, sinus pain, or headaches.  She denies any body aches or rashes.  On physical exam today, the patient does look somewhat dehydrated.  Her mucous membranes are dry.  There is no swelling in her legs.  Her lungs are clear.  She is taking Lasix 20 mg daily. Past Medical History:  Diagnosis Date  . Angioedema    possibly from voltaren  . Bronchopneumonia 12/11/2016  . Degenerative disc disease   . Diabetes mellitus    type II  . Hyperlipidemia   . Hypertension   . LVH (left ventricular hypertrophy)    and atrial enlargement by echo in past with nl EF  . Nasal pruritis   . Osteoarthritis   . Osteopenia   . Renal insufficiency   . Sleep apnea   . Stroke [facility]) 05/2010   Small vessel sobcortical (in [place] trial) with Dr Leonie Man, residual L hemiparesis  . Vitamin B 12 deficiency 04/08   Past Surgical History:  Procedure Laterality Date  . ABDOMINAL HYSTERECTOMY     BSO-fibroids  . APPENDECTOMY    . BACK SURGERY    . COLON SURGERY     due to punctured intestines  . EYE SURGERY     cataract extraction  . IR THORACENTESIS ASP PLEURAL SPACE W/IMG GUIDE  06/07/2020  . KNEE SURGERY     arthroscope  . PARS PLANA VITRECTOMY  07/31/2011   Procedure: PARS PLANA VITRECTOMY WITH 25 GAUGE;  Surgeon: [name], MD;  Location: [place];  Service: Ophthalmology;  Laterality: Right;  REMOVAL OF SILICONE OIL AND LASER RIGHT EYE  . RETINAL DETACHMENT  SURGERY  02/18/11   times 2  . SPINE SURGERY  08/09   spinal decompression surgery   Current Outpatient Medications on File Prior to Visit  Medication Sig Dispense Refill  . albuterol (VENTOLIN HFA) 108 (90 Base) MCG/ACT inhaler INHALE 2 PUFFS INTO THE LUNGS EVERY 4 HOURS AS NEEDED FOR WHEEZING/SHORTNESS OF BREATH 18 each 3  . ALPRAZolam (XANAX) 0.5 MG tablet Take 0.5-1 tablets (0.25-0.5 mg total) by mouth 2 (two) times daily as needed for anxiety. 60 tablet 2  . amLODipine (NORVASC) 10 MG tablet Take 1 tablet (10 mg total) by mouth daily. 90 tablet 3  . Blood Glucose Monitoring Suppl (ONE TOUCH ULTRA 2) w/Device KIT Check blood sugar once daily  and as directed. Dx E11.9 1 each 0  . calcium-vitamin D (OSCAL WITH D) 500-200 MG-UNIT per tablet Take 1 tablet by mouth daily.    . cephALEXin (KEFLEX) 500 MG capsule Take 1 capsule (500 mg total) by mouth daily. 30 capsule 3  . Cholecalciferol (VITAMIN D) 1000 UNITS capsule Take 1,000 Units by mouth daily.    . Cranberry 500 MG TABS Take 500 mg by mouth in the morning and at bedtime.    . docusate sodium (COLACE) 100 MG capsule Take 200 mg by mouth at bedtime.    . ferrous sulfate 325 (65 FE) MG tablet Take 325 mg by mouth 2 (two) times daily. (Patient not taking: Reported on 10/18/2020)    . furosemide (LASIX) 20 MG tablet TAKE 1/2 TABLET BY MOUTH DAILY 30 tablet 3  . gemfibrozil (LOPID) 600 MG tablet Take 1 tablet (600 mg total) by mouth daily. (Patient not taking: Reported on 10/18/2020) 90 tablet 3  . glipiZIDE (GLIPIZIDE XL) 5 MG 24 hr tablet Take 1 tablet (5 mg total) by mouth daily with breakfast. 90 tablet 3  . hydrALAZINE (APRESOLINE) 100 MG tablet Take 1 tablet (100 mg total) by mouth 3 (three) times daily. 270 tablet 3  . Lancets (ONETOUCH DELICA PLUS [id number]) MISC CHECK BLOOD SUGAR ONCE DAILY AND AS DIRECTED. DX E11.9 100 each 2  . meclizine (ANTIVERT) 25 MG tablet Take 1 tablet (25 mg total) by mouth 3 (three) times daily as needed for  dizziness. (Patient not taking: Reported on 10/18/2020) 30 tablet 0  . metoprolol succinate (TOPROL-XL) 25 MG 24 hr tablet Take 0.5 tablets (12.5 mg total) by mouth 2 (two) times daily. 90 tablet 3  . mirtazapine (REMERON) 15 MG tablet TAKE 1/2 TABLET BY MOUTH AT BEDTIME 45 tablet 3  . Multiple Vitamin (MULTIVITAMIN) capsule Take 1 capsule by mouth daily.    . Multiple Vitamins-Minerals (PRESERVISION AREDS 2+MULTI VIT) CAPS Take 1 capsule by mouth in the morning and at bedtime.    . Olopatadine HCl (PATADAY OP) Place 1 drop into both eyes daily.    Glory Rosebush ULTRA test strip CHECK BLOOD SUGAR ONCE DAILY AND AS DIRECTED. DX E11.9 100 strip 1  . sertraline (ZOLOFT) 50 MG tablet Take 1 tablet (50 mg total) by mouth daily. 90 tablet 3  . sitaGLIPtin (JANUVIA) 100 MG tablet Take 1 tablet (100 mg total) by mouth daily. 90 tablet 3  . sulfamethoxazole-trimethoprim (BACTRIM DS) 800-160 MG tablet Take 1 tablet by mouth 2 (two) times daily. (Patient not taking: Reported on 10/18/2020) 6 tablet 0  . trandolapril (MAVIK) 4 MG tablet Take 1 tablet (4 mg total) by mouth daily. 90 tablet 3  . triamcinolone (NASACORT) 55 MCG/ACT AERO nasal inhaler Place 2 sprays into the nose daily. (Patient taking differently: Place 2 sprays into the nose at bedtime.) 1 each 5  . vitamin B-12 (CYANOCOBALAMIN) 1000 MCG tablet Take 1,000 mcg by mouth daily.      No current facility-administered medications on file prior to visit.   Allergies  Allergen Reactions  . Diclofenac Sodium Other (See Comments)    REACTION: angioedema  . Pioglitazone Swelling   Social History   Socioeconomic History  . Marital status: Widowed    Spouse name: Not on file  . Number of children: Not on file  . Years of education: Not on file  . Highest education level: Not on file  Occupational History  . Not on file  Tobacco Use  . Smoking status:  Never Smoker  . Smokeless tobacco: Never Used  Vaping Use  . Vaping Use: Not on file  Substance  and Sexual Activity  . Alcohol use: No    Alcohol/week: 0.0 standard drinks  . Drug use: No  . Sexual activity: Never  Other Topics Concern  . Not on file  Social History Narrative  . Not on file   Social Determinants of Health   Financial Resource Strain: Not on file  Food Insecurity: Not on file  Transportation Needs: Not on file  Physical Activity: Not on file  Stress: Not on file  Social Connections: Not on file  Intimate Partner Violence: Not on file      Review of Systems  Genitourinary: Positive for frequency.  Musculoskeletal: Positive for back pain.  All other systems reviewed and are negative.      Objective:   Physical Exam Vitals reviewed.  Constitutional:      General: She is not in acute distress.    Appearance: Normal appearance. She is obese. She is not ill-appearing, toxic-appearing or diaphoretic.  HENT:     Right Ear: Tympanic membrane and ear canal normal.     Left Ear: Tympanic membrane and ear canal normal.     Nose: Nose normal. No congestion or rhinorrhea.  Cardiovascular:     Rate and Rhythm: Normal rate and regular rhythm.     Heart sounds: Normal heart sounds. No murmur heard. No friction rub. No gallop.   Pulmonary:     Effort: Pulmonary effort is normal. No respiratory distress.     Breath sounds: Normal breath sounds. No stridor. No wheezing, rhonchi or rales.  Musculoskeletal:     Right lower leg: No edema.     Left lower leg: No edema.  Neurological:     Mental Status: She is alert and oriented to person, place, and time. Mental status is at baseline.     Sensory: No sensory deficit.     Motor: Weakness present.     Coordination: Coordination abnormal.     Gait: Gait abnormal.   Chronic left facial droop due to her previous remote right basal ganglial infarct        Assessment & Plan:  Dysuria - Plan: Urinalysis, Routine w reflex microscopic  Confusion - Plan: Ambulatory referral to [place]  Dizziness  Patient  clinically looks dehydrated.  I have asked her to hold the Lasix for the next 2 or 3 days and try to drink 1 or 2 glasses of water extra to improve her hydration.  Check a CMP and a CBC.  I will send a urine culture however her initial urinalysis is normal.  Reassess in 2 to 3 days to see how she is feeling.  I hope that as her hydration improves, the dizziness and lightheadedness will improve.  Today her mental status appears to be near her baseline.  I will consult home health physical therapy to improve her strength as she would benefit from physical therapy

## 2020-12-08 LAB — CBC WITH DIFFERENTIAL/PLATELET
Absolute Monocytes: 517 cells/uL (ref 200–950)
Basophils Absolute: 41 cells/uL (ref 0–200)
Basophils Relative: 1 %
Eosinophils Absolute: 238 cells/uL (ref 15–500)
Eosinophils Relative: 5.8 %
HCT: 37.7 % (ref 35.0–45.0)
Hemoglobin: 12.1 g/dL (ref 11.7–15.5)
Lymphs Abs: 1251 cells/uL (ref 850–3900)
MCH: 28.3 pg (ref 27.0–33.0)
MCHC: 32.1 g/dL (ref 32.0–36.0)
MCV: 88.1 fL (ref 80.0–100.0)
MPV: 12 fL (ref 7.5–12.5)
Monocytes Relative: 12.6 %
Neutro Abs: 2054 cells/uL (ref 1500–7800)
Neutrophils Relative %: 50.1 %
Platelets: 170 10*3/uL (ref 140–400)
RBC: 4.28 10*6/uL (ref 3.80–5.10)
RDW: 12.5 % (ref 11.0–15.0)
Total Lymphocyte: 30.5 %
WBC: 4.1 10*3/uL (ref 3.8–10.8)

## 2020-12-08 LAB — COMPLETE METABOLIC PANEL WITH GFR
AG Ratio: 2.3 (calc) (ref 1.0–2.5)
ALT: 17 U/L (ref 6–29)
AST: 17 U/L (ref 10–35)
Albumin: 3.6 g/dL (ref 3.6–5.1)
Alkaline phosphatase (APISO): 74 U/L (ref 37–153)
BUN/Creatinine Ratio: 21 (calc) (ref 6–22)
BUN: 28 mg/dL — ABNORMAL HIGH (ref 7–25)
CO2: 25 mmol/L (ref 20–32)
Calcium: 8.8 mg/dL (ref 8.6–10.4)
Chloride: 105 mmol/L (ref 98–110)
Creat: 1.35 mg/dL — ABNORMAL HIGH (ref 0.60–0.88)
GFR, Est African American: 40 mL/min/{1.73_m2} — ABNORMAL LOW (ref >=60)
GFR, Est Non African American: 35 mL/min/{1.73_m2} — ABNORMAL LOW (ref >=60)
Globulin: 1.6 g/dL (calc) — ABNORMAL LOW (ref 1.9–3.7)
Glucose, Bld: 228 mg/dL — ABNORMAL HIGH (ref 65–99)
Potassium: 4.8 mmol/L (ref 3.5–5.3)
Sodium: 139 mmol/L (ref 135–146)
Total Bilirubin: 0.3 mg/dL (ref 0.2–1.2)
Total Protein: 5.2 g/dL — ABNORMAL LOW (ref 6.1–8.1)

## 2020-12-09 ENCOUNTER — Other Ambulatory Visit: Payer: Self-pay | Admitting: Family Medicine

## 2020-12-09 LAB — URINE CULTURE
MICRO NUMBER:: 11792606
SPECIMEN QUALITY:: ADEQUATE

## 2020-12-09 MED ORDER — SULFAMETHOXAZOLE-TRIMETHOPRIM 800-160 MG PO TABS: 1.0000 | ORAL_TABLET | Freq: Two times a day (BID) | ORAL | 0 refills | Status: DC

## 2020-12-10 DIAGNOSIS — E11319 Type 2 diabetes mellitus with unspecified diabetic retinopathy without macular edema: Secondary | ICD-10-CM | POA: Diagnosis not present

## 2020-12-10 DIAGNOSIS — E1122 Type 2 diabetes mellitus with diabetic chronic kidney disease: Secondary | ICD-10-CM | POA: Diagnosis not present

## 2020-12-10 DIAGNOSIS — R69 Illness, unspecified: Secondary | ICD-10-CM | POA: Diagnosis not present

## 2020-12-10 DIAGNOSIS — I129 Hypertensive chronic kidney disease with stage 1 through stage 4 chronic kidney disease, or unspecified chronic kidney disease: Secondary | ICD-10-CM | POA: Diagnosis not present

## 2020-12-10 DIAGNOSIS — E785 Hyperlipidemia, unspecified: Secondary | ICD-10-CM | POA: Diagnosis not present

## 2020-12-10 DIAGNOSIS — N184 Chronic kidney disease, stage 4 (severe): Secondary | ICD-10-CM | POA: Diagnosis not present

## 2020-12-10 DIAGNOSIS — E1169 Type 2 diabetes mellitus with other specified complication: Secondary | ICD-10-CM | POA: Diagnosis not present

## 2020-12-10 DIAGNOSIS — E860 Dehydration: Secondary | ICD-10-CM | POA: Diagnosis not present

## 2020-12-10 DIAGNOSIS — I700 Atherosclerosis of aorta: Secondary | ICD-10-CM | POA: Diagnosis not present

## 2020-12-12 ENCOUNTER — Telehealth: Payer: Self-pay

## 2020-12-12 NOTE — Telephone Encounter (Signed)
Approved verbal orders for PT

## 2020-12-13 DIAGNOSIS — R69 Illness, unspecified: Secondary | ICD-10-CM | POA: Diagnosis not present

## 2020-12-13 DIAGNOSIS — I129 Hypertensive chronic kidney disease with stage 1 through stage 4 chronic kidney disease, or unspecified chronic kidney disease: Secondary | ICD-10-CM | POA: Diagnosis not present

## 2020-12-13 DIAGNOSIS — N184 Chronic kidney disease, stage 4 (severe): Secondary | ICD-10-CM | POA: Diagnosis not present

## 2020-12-13 DIAGNOSIS — E11319 Type 2 diabetes mellitus with unspecified diabetic retinopathy without macular edema: Secondary | ICD-10-CM | POA: Diagnosis not present

## 2020-12-13 DIAGNOSIS — I700 Atherosclerosis of aorta: Secondary | ICD-10-CM | POA: Diagnosis not present

## 2020-12-13 DIAGNOSIS — E1169 Type 2 diabetes mellitus with other specified complication: Secondary | ICD-10-CM | POA: Diagnosis not present

## 2020-12-13 DIAGNOSIS — E1122 Type 2 diabetes mellitus with diabetic chronic kidney disease: Secondary | ICD-10-CM | POA: Diagnosis not present

## 2020-12-13 DIAGNOSIS — E860 Dehydration: Secondary | ICD-10-CM | POA: Diagnosis not present

## 2020-12-13 DIAGNOSIS — E785 Hyperlipidemia, unspecified: Secondary | ICD-10-CM | POA: Diagnosis not present

## 2020-12-14 DIAGNOSIS — E860 Dehydration: Secondary | ICD-10-CM | POA: Diagnosis not present

## 2020-12-14 DIAGNOSIS — E785 Hyperlipidemia, unspecified: Secondary | ICD-10-CM | POA: Diagnosis not present

## 2020-12-14 DIAGNOSIS — I129 Hypertensive chronic kidney disease with stage 1 through stage 4 chronic kidney disease, or unspecified chronic kidney disease: Secondary | ICD-10-CM | POA: Diagnosis not present

## 2020-12-14 DIAGNOSIS — E1169 Type 2 diabetes mellitus with other specified complication: Secondary | ICD-10-CM | POA: Diagnosis not present

## 2020-12-14 DIAGNOSIS — E11319 Type 2 diabetes mellitus with unspecified diabetic retinopathy without macular edema: Secondary | ICD-10-CM | POA: Diagnosis not present

## 2020-12-14 DIAGNOSIS — I700 Atherosclerosis of aorta: Secondary | ICD-10-CM | POA: Diagnosis not present

## 2020-12-14 DIAGNOSIS — E1122 Type 2 diabetes mellitus with diabetic chronic kidney disease: Secondary | ICD-10-CM | POA: Diagnosis not present

## 2020-12-14 DIAGNOSIS — N184 Chronic kidney disease, stage 4 (severe): Secondary | ICD-10-CM | POA: Diagnosis not present

## 2020-12-14 DIAGNOSIS — R69 Illness, unspecified: Secondary | ICD-10-CM | POA: Diagnosis not present

## 2020-12-19 ENCOUNTER — Encounter: Payer: Self-pay | Admitting: Family Medicine

## 2020-12-19 ENCOUNTER — Ambulatory Visit (INDEPENDENT_AMBULATORY_CARE_PROVIDER_SITE_OTHER): Payer: Medicare HMO | Admitting: Family Medicine

## 2020-12-19 ENCOUNTER — Other Ambulatory Visit: Payer: Self-pay

## 2020-12-19 VITALS — BP 130/80 | HR 95 | Temp 97.7°F | Resp 14 | Ht 62.0 in | Wt 172.0 lb

## 2020-12-19 DIAGNOSIS — E1122 Type 2 diabetes mellitus with diabetic chronic kidney disease: Secondary | ICD-10-CM | POA: Diagnosis not present

## 2020-12-19 DIAGNOSIS — N390 Urinary tract infection, site not specified: Secondary | ICD-10-CM

## 2020-12-19 DIAGNOSIS — E785 Hyperlipidemia, unspecified: Secondary | ICD-10-CM | POA: Diagnosis not present

## 2020-12-19 DIAGNOSIS — E11319 Type 2 diabetes mellitus with unspecified diabetic retinopathy without macular edema: Secondary | ICD-10-CM | POA: Diagnosis not present

## 2020-12-19 DIAGNOSIS — E1165 Type 2 diabetes mellitus with hyperglycemia: Secondary | ICD-10-CM

## 2020-12-19 DIAGNOSIS — I129 Hypertensive chronic kidney disease with stage 1 through stage 4 chronic kidney disease, or unspecified chronic kidney disease: Secondary | ICD-10-CM | POA: Diagnosis not present

## 2020-12-19 DIAGNOSIS — E860 Dehydration: Secondary | ICD-10-CM | POA: Diagnosis not present

## 2020-12-19 DIAGNOSIS — I700 Atherosclerosis of aorta: Secondary | ICD-10-CM | POA: Diagnosis not present

## 2020-12-19 DIAGNOSIS — E1169 Type 2 diabetes mellitus with other specified complication: Secondary | ICD-10-CM | POA: Diagnosis not present

## 2020-12-19 DIAGNOSIS — N184 Chronic kidney disease, stage 4 (severe): Secondary | ICD-10-CM | POA: Diagnosis not present

## 2020-12-19 DIAGNOSIS — R69 Illness, unspecified: Secondary | ICD-10-CM | POA: Diagnosis not present

## 2020-12-19 LAB — URINALYSIS, ROUTINE W REFLEX MICROSCOPIC
Bilirubin Urine: NEGATIVE
Glucose, UA: NEGATIVE
Hgb urine dipstick: NEGATIVE
Ketones, ur: NEGATIVE
Leukocytes,Ua: NEGATIVE
Nitrite: NEGATIVE
Protein, ur: NEGATIVE
Specific Gravity, Urine: 1.015 (ref 1.001–1.035)
pH: 5.5 (ref 5.0–8.0)

## 2020-12-19 NOTE — Progress Notes (Signed)
Subjective:    Patient ID: Renee [name], female    DOB: [birth year], 87 y.o.   MRN: [id number]  09/26/20 Patient is here today with her sister. Sister is concerned that she has a bladder infection. She reports 2-day history of low back pain. Urinalysis today shows only trace leukocyte esterase but no blood and no nitrates. However she has been hospitalized 2 separate times recently for urinary tract infection and therefore family is very concerned. However on exam today, the patient is exquisitely tender to palpation over the iliac crest on the left-hand side and the right-hand side. It is definitely a skeletal pain that the patient is experiencing. She states that the pain is worse when she turns over in bed or tries to change positions in a chair. She denies any falls. However she I am able to reproduce the pain exactly by pressing on the iliac crest on both sides. She denies any pain in the hip joint and she is able to bear weight on her legs without difficulty or pain in the hip. Family has discussed with the patient and they would like to start a daily antibiotic to prevent urinary tract infections given how sick she is gotten recently with him.  At that time, my plan was: I believe the pelvic pain likely represents an injury to the pelvic bone itself. Patient tends to fall down in her chair when she sits down and I am curious if she may have cracked part of the pelvis. Therefore I want to get an x-ray immediately. Examination of the hip is relatively normal. The pain does not appear to be coming from a bladder infection. She does have some trace leukocyte esterase we could simply just be contaminant. However the patient wants to aggressively treat this due to her recent history and therefore I will treat with Bactrim double strength tablets twice daily for 3 days. They have also decided to start taking a daily antibiotic Keflex 500 mg a day for UTI prophylaxis. We discussed the risks and benefits of  this including resistant infections, yeast infections etc. and the patient is willing to accept that risk. I will schedule the patient for home health physical therapy as she has shown progressive weakness and deconditioning and the family is requesting that we order this  10/18/20 Urine culture on 2/7 grew Klebsiella that was resistant to Keflex but sensitive to Bactrim.  Therefore the Bactrim should have treated the initial infection.  She denies any dysuria or urgency or frequency.  The back pain that she was experiencing at her last visit has resolved.  She is checking her blood sugar.  She is currently on glipizide extended release 5 mg a day and Januvia 100 mg a day.  Her fasting blood sugars are averaging between 140 and 170 every morning.  She is not checking any 2-hour postprandial sugars.  She denies any sugar reading above 200 in the last several weeks.  She denies any polyuria polydipsia or blurry vision.  She denies any chest pain or shortness of breath or dyspnea on exertion.  Her hemoglobin A1c at her last office visit was 7.9.  At that time, my plan was: Overall the patient seems to be doing much better.  We will continue Keflex as a preventative medication however I will check a urine culture today.  Her last infection was Klebsiella resistant to Keflex.  If the patient continues to have a bacteria growing in her urine, we may need to switch to  a preventative medication to which it is sensitive.  Therefore I would like to see culture and sensitivities when she is asymptomatic.  Regarding her diabetes, given her advanced age, I would like to keep her blood sugar between 100-200 without having hypoglycemic episodes.  It seems that the patient is there.  Therefore I would recommend rechecking a hemoglobin A1c in 3 months however overall she seems to be doing better.  12/07/20 Urine culture 3/1 was clear of infection.  Over the last week, the patient has felt lightheaded.  She describes it as being  swimmy headed.  She feels weak.  She is also been having night sweats and hot flashes at night.  She was concerned that she may have a urinary tract infection.  Urinalysis today shows no blood, no nitrates, and no leukocyte esterase.  Therefore she does not appear to have a urinary tract infection although I will send a urine culture.  She denies any cough or chest pain or shortness of breath.  She did have an episode of diarrhea on Monday but otherwise she denies any abdominal pain nausea vomiting diarrhea.  She denies any rhinorrhea, throat sore throat, sinus pain, or headaches.  She denies any body aches or rashes.  On physical exam today, the patient does look somewhat dehydrated.  Her mucous membranes are dry.  There is no swelling in her legs.  Her lungs are clear.  She is taking Lasix 20 mg daily.  At that time, my plan was: Patient clinically looks dehydrated.  I have asked her to hold the Lasix for the next 2 or 3 days and try to drink 1 or 2 glasses of water extra to improve her hydration.  Check a CMP and a CBC.  I will send a urine culture however her initial urinalysis is normal.  Reassess in 2 to 3 days to see how she is feeling.  I hope that as her hydration improves, the dizziness and lightheadedness will improve.  Today her mental status appears to be near her baseline.  I will consult home health physical therapy to improve her strength as she would benefit from physical therapy   12/19/20 Urine Culture grew citrobacter freundi and I started her on 5 days of bactrim.  She continues to report night sweats.  She states that they wake her up at night and keeps her from resting well.  She denies any dysuria or urgency.  She denies any abdominal pain.  She denies any chest pain or shortness of breath however she has started back on her Lasix.  She does not appear fluid overloaded today.  If anything she appears dehydrated.  Her caretaker also states that her blood pressure has been as low as 277  systolic at home.  Therefore I feel that she may be dehydrated and that she is taking too much medication.  I do not think that the night sweats are due to hypoglycemia.  She states that her blood sugars are typically between 150 and 190.  She does continue to report vertigo.  She states that whenever she rolls over in bed, it will feel like the room is spinning for a few seconds and then it subsides.  It happens whenever she lays down or rolls over in bed.  This certainly seems like BPPV.  She has an appointment later this week to see the ENT doctor Past Medical History:  Diagnosis Date  . Angioedema    possibly from voltaren  . Bronchopneumonia 12/11/2016  .  Degenerative disc disease   . Diabetes mellitus    type II  . Hyperlipidemia   . Hypertension   . LVH (left ventricular hypertrophy)    and atrial enlargement by echo in past with nl EF  . Nasal pruritis   . Osteoarthritis   . Osteopenia   . Renal insufficiency   . Sleep apnea   . Stroke [facility]) 05/2010   Small vessel sobcortical (in Point trial) with Dr Leonie Man, residual L hemiparesis  . Vitamin B 12 deficiency 04/08   Past Surgical History:  Procedure Laterality Date  . ABDOMINAL HYSTERECTOMY     BSO-fibroids  . APPENDECTOMY    . BACK SURGERY    . COLON SURGERY     due to punctured intestines  . EYE SURGERY     cataract extraction  . IR THORACENTESIS ASP PLEURAL SPACE W/IMG GUIDE  06/07/2020  . KNEE SURGERY     arthroscope  . PARS PLANA VITRECTOMY  07/31/2011   Procedure: PARS PLANA VITRECTOMY WITH 25 GAUGE;  Surgeon: [name], MD;  Location: [place];  Service: Ophthalmology;  Laterality: Right;  REMOVAL OF SILICONE OIL AND LASER RIGHT EYE  . RETINAL DETACHMENT SURGERY  02/18/11   times 2  . SPINE SURGERY  08/09   spinal decompression surgery   Current Outpatient Medications on File Prior to Visit  Medication Sig Dispense Refill  . albuterol (VENTOLIN HFA) 108 (90 Base) MCG/ACT inhaler INHALE 2 PUFFS INTO THE LUNGS  EVERY 4 HOURS AS NEEDED FOR WHEEZING/SHORTNESS OF BREATH (Patient not taking: Reported on 12/07/2020) 18 each 3  . ALPRAZolam (XANAX) 0.5 MG tablet Take 0.5-1 tablets (0.25-0.5 mg total) by mouth 2 (two) times daily as needed for anxiety. 60 tablet 2  . amLODipine (NORVASC) 10 MG tablet Take 1 tablet (10 mg total) by mouth daily. 90 tablet 3  . Blood Glucose Monitoring Suppl (ONE TOUCH ULTRA 2) w/Device KIT Check blood sugar once daily and as directed. Dx E11.9 1 each 0  . calcium-vitamin D (OSCAL WITH D) 500-200 MG-UNIT per tablet Take 1 tablet by mouth daily.    . cephALEXin (KEFLEX) 500 MG capsule Take 1 capsule (500 mg total) by mouth daily. 30 capsule 3  . Cholecalciferol (VITAMIN D) 1000 UNITS capsule Take 1,000 Units by mouth daily.    . Cranberry 500 MG TABS Take 500 mg by mouth in the morning and at bedtime.    . docusate sodium (COLACE) 100 MG capsule Take 200 mg by mouth at bedtime.    . furosemide (LASIX) 20 MG tablet TAKE 1/2 TABLET BY MOUTH DAILY 30 tablet 3  . glipiZIDE (GLIPIZIDE XL) 5 MG 24 hr tablet Take 1 tablet (5 mg total) by mouth daily with breakfast. 90 tablet 3  . hydrALAZINE (APRESOLINE) 100 MG tablet Take 1 tablet (100 mg total) by mouth 3 (three) times daily. 270 tablet 3  . Lancets (ONETOUCH DELICA PLUS [id number]) MISC CHECK BLOOD SUGAR ONCE DAILY AND AS DIRECTED. DX E11.9 100 each 2  . metoprolol succinate (TOPROL-XL) 25 MG 24 hr tablet Take 0.5 tablets (12.5 mg total) by mouth 2 (two) times daily. 90 tablet 3  . mirtazapine (REMERON) 15 MG tablet TAKE 1/2 TABLET BY MOUTH AT BEDTIME 45 tablet 3  . Multiple Vitamin (MULTIVITAMIN) capsule Take 1 capsule by mouth daily.    . Multiple Vitamins-Minerals (PRESERVISION AREDS 2+MULTI VIT) CAPS Take 1 capsule by mouth in the morning and at bedtime.    . Olopatadine HCl (PATADAY OP)  Place 1 drop into both eyes daily.    Glory Rosebush ULTRA test strip CHECK BLOOD SUGAR ONCE DAILY AND AS DIRECTED. DX E11.9 100 strip 1  . sertraline  (ZOLOFT) 50 MG tablet Take 1 tablet (50 mg total) by mouth daily. 90 tablet 3  . sitaGLIPtin (JANUVIA) 100 MG tablet Take 1 tablet (100 mg total) by mouth daily. 90 tablet 3  . sulfamethoxazole-trimethoprim (BACTRIM DS) 800-160 MG tablet Take 1 tablet by mouth 2 (two) times daily. 10 tablet 0  . trandolapril (MAVIK) 4 MG tablet Take 1 tablet (4 mg total) by mouth daily. 90 tablet 3  . triamcinolone (NASACORT) 55 MCG/ACT AERO nasal inhaler Place 2 sprays into the nose daily. (Patient taking differently: Place 2 sprays into the nose at bedtime.) 1 each 5  . vitamin B-12 (CYANOCOBALAMIN) 1000 MCG tablet Take 1,000 mcg by mouth daily.      No current facility-administered medications on file prior to visit.   Allergies  Allergen Reactions  . Diclofenac Sodium Other (See Comments)    REACTION: angioedema  . Pioglitazone Swelling   Social History   Socioeconomic History  . Marital status: Widowed    Spouse name: Not on file  . Number of children: Not on file  . Years of education: Not on file  . Highest education level: Not on file  Occupational History  . Not on file  Tobacco Use  . Smoking status: Never Smoker  . Smokeless tobacco: Never Used  Vaping Use  . Vaping Use: Not on file  Substance and Sexual Activity  . Alcohol use: No    Alcohol/week: 0.0 standard drinks  . Drug use: No  . Sexual activity: Never  Other Topics Concern  . Not on file  Social History Narrative  . Not on file   Social Determinants of Health   Financial Resource Strain: Not on file  Food Insecurity: Not on file  Transportation Needs: Not on file  Physical Activity: Not on file  Stress: Not on file  Social Connections: Not on file  Intimate Partner Violence: Not on file      Review of Systems  Genitourinary: Positive for frequency.  Musculoskeletal: Positive for back pain.  All other systems reviewed and are negative.      Objective:   Physical Exam Vitals reviewed.  Constitutional:       General: She is not in acute distress.    Appearance: Normal appearance. She is obese. She is not ill-appearing, toxic-appearing or diaphoretic.  HENT:     Right Ear: Tympanic membrane and ear canal normal.     Left Ear: Tympanic membrane and ear canal normal.     Nose: Nose normal. No congestion or rhinorrhea.  Cardiovascular:     Rate and Rhythm: Normal rate and regular rhythm.     Heart sounds: Normal heart sounds. No murmur heard. No friction rub. No gallop.   Pulmonary:     Effort: Pulmonary effort is normal. No respiratory distress.     Breath sounds: Normal breath sounds. No stridor. No wheezing, rhonchi or rales.  Musculoskeletal:     Right lower leg: No edema.     Left lower leg: No edema.  Neurological:     Mental Status: She is alert and oriented to person, place, and time. Mental status is at baseline.     Sensory: No sensory deficit.     Motor: Weakness present.     Coordination: Coordination abnormal.     Gait: Gait abnormal.  Chronic left facial droop due to her previous remote right basal ganglial infarct        Assessment & Plan:  Frequent UTI - Plan: Urine Culture, Urinalysis, Routine w reflex microscopic  Type 2 diabetes mellitus with hyperglycemia, without long-term current use of insulin (HCC) - Plan: Hemoglobin Z5U, BASIC METABOLIC PANEL WITH GFR  Repeat urinalysis and a urine culture to ensure resolution of UTI.  Due to lower blood pressures at home, I want to discontinue furosemide as I feel that she is on too much medication and may even be dehydrated causing her blood pressure does drop.  I believe the dizziness is actually vertigo due to BPPV.  I do not feel medication would be beneficial given the short amount of time she has symptoms of vertigo.  I would consider Epley maneuvers howeve she has an appointment to see ENT tomorrow and so I will await their recommendations.  Check hemoglobin A1c to monitor management of diabetes.  I will be happy with  an A1c less than 7.5 for this individual given her frail health.  Recommended that she use a box fan at night on her bedside table to help calm down some of the night sweats and help with her insomnia due to the white noise.

## 2020-12-19 NOTE — Addendum Note (Signed)
Addended by: Jenna Luo T on: 12/19/2020 11:38 AM   Modules accepted: Orders

## 2020-12-20 DIAGNOSIS — E1122 Type 2 diabetes mellitus with diabetic chronic kidney disease: Secondary | ICD-10-CM | POA: Diagnosis not present

## 2020-12-20 DIAGNOSIS — E785 Hyperlipidemia, unspecified: Secondary | ICD-10-CM | POA: Diagnosis not present

## 2020-12-20 DIAGNOSIS — I129 Hypertensive chronic kidney disease with stage 1 through stage 4 chronic kidney disease, or unspecified chronic kidney disease: Secondary | ICD-10-CM | POA: Diagnosis not present

## 2020-12-20 DIAGNOSIS — R69 Illness, unspecified: Secondary | ICD-10-CM | POA: Diagnosis not present

## 2020-12-20 DIAGNOSIS — I700 Atherosclerosis of aorta: Secondary | ICD-10-CM | POA: Diagnosis not present

## 2020-12-20 DIAGNOSIS — E860 Dehydration: Secondary | ICD-10-CM | POA: Diagnosis not present

## 2020-12-20 DIAGNOSIS — E11319 Type 2 diabetes mellitus with unspecified diabetic retinopathy without macular edema: Secondary | ICD-10-CM | POA: Diagnosis not present

## 2020-12-20 DIAGNOSIS — N184 Chronic kidney disease, stage 4 (severe): Secondary | ICD-10-CM | POA: Diagnosis not present

## 2020-12-20 DIAGNOSIS — E1169 Type 2 diabetes mellitus with other specified complication: Secondary | ICD-10-CM | POA: Diagnosis not present

## 2020-12-20 LAB — BASIC METABOLIC PANEL WITH GFR
BUN/Creatinine Ratio: 18 (calc) (ref 6–22)
BUN: 26 mg/dL — ABNORMAL HIGH (ref 7–25)
CO2: 25 mmol/L (ref 20–32)
Calcium: 9.3 mg/dL (ref 8.6–10.4)
Chloride: 104 mmol/L (ref 98–110)
Creat: 1.44 mg/dL — ABNORMAL HIGH (ref 0.60–0.88)
GFR, Est African American: 37 mL/min/{1.73_m2} — ABNORMAL LOW (ref >=60)
GFR, Est Non African American: 32 mL/min/{1.73_m2} — ABNORMAL LOW (ref >=60)
Glucose, Bld: 197 mg/dL — ABNORMAL HIGH (ref 65–99)
Potassium: 5 mmol/L (ref 3.5–5.3)
Sodium: 136 mmol/L (ref 135–146)

## 2020-12-20 LAB — URINE CULTURE
MICRO NUMBER:: 11837670
Result:: NO GROWTH
SPECIMEN QUALITY:: ADEQUATE

## 2020-12-20 LAB — HEMOGLOBIN A1C
Hgb A1c MFr Bld: 6.7 % of total Hgb — ABNORMAL HIGH (ref <5.7)
Mean Plasma Glucose: 146 mg/dL
eAG (mmol/L): 8.1 mmol/L

## 2020-12-21 ENCOUNTER — Other Ambulatory Visit: Payer: Self-pay

## 2020-12-21 ENCOUNTER — Ambulatory Visit (INDEPENDENT_AMBULATORY_CARE_PROVIDER_SITE_OTHER): Payer: Medicare HMO | Admitting: Otolaryngology

## 2020-12-21 VITALS — Temp 96.8°F

## 2020-12-21 DIAGNOSIS — H903 Sensorineural hearing loss, bilateral: Secondary | ICD-10-CM | POA: Diagnosis not present

## 2020-12-21 DIAGNOSIS — R42 Dizziness and giddiness: Secondary | ICD-10-CM

## 2020-12-21 DIAGNOSIS — H6523 Chronic serous otitis media, bilateral: Secondary | ICD-10-CM

## 2020-12-21 NOTE — Progress Notes (Signed)
HPI: Renee [name] is a 87 y.o. female who returns today for evaluation of dizziness and hearing problems.  This is been going on for several months.  She saw her PCP who thought it may be related to the fluid in her ears.  She uses a walker. She presents today with her caregiver. She has had a history of serous otitis has been previously treated with nasal steroid spray and amoxicillin.  I last saw her back in November with serous otitis and performed myringotomies. She wears hearing aids but is not able to hear well even with the hearing aids..  Past Medical History:  Diagnosis Date  . Angioedema    possibly from voltaren  . Bronchopneumonia 12/11/2016  . Degenerative disc disease   . Diabetes mellitus    type II  . Hyperlipidemia   . Hypertension   . LVH (left ventricular hypertrophy)    and atrial enlargement by echo in past with nl EF  . Nasal pruritis   . Osteoarthritis   . Osteopenia   . Renal insufficiency   . Sleep apnea   . Stroke [facility]) 05/2010   Small vessel sobcortical (in Point trial) with Dr Leonie Man, residual L hemiparesis  . Vitamin B 12 deficiency 04/08   Past Surgical History:  Procedure Laterality Date  . ABDOMINAL HYSTERECTOMY     BSO-fibroids  . APPENDECTOMY    . BACK SURGERY    . COLON SURGERY     due to punctured intestines  . EYE SURGERY     cataract extraction  . IR THORACENTESIS ASP PLEURAL SPACE W/IMG GUIDE  06/07/2020  . KNEE SURGERY     arthroscope  . PARS PLANA VITRECTOMY  07/31/2011   Procedure: PARS PLANA VITRECTOMY WITH 25 GAUGE;  Surgeon: [name], MD;  Location: [place];  Service: Ophthalmology;  Laterality: Right;  REMOVAL OF SILICONE OIL AND LASER RIGHT EYE  . RETINAL DETACHMENT SURGERY  02/18/11   times 2  . SPINE SURGERY  08/09   spinal decompression surgery   Social History   Socioeconomic History  . Marital status: Widowed    Spouse name: Not on file  . Number of children: Not on file  . Years of education: Not on file  .  Highest education level: Not on file  Occupational History  . Not on file  Tobacco Use  . Smoking status: Never Smoker  . Smokeless tobacco: Never Used  Vaping Use  . Vaping Use: Not on file  Substance and Sexual Activity  . Alcohol use: No    Alcohol/week: 0.0 standard drinks  . Drug use: No  . Sexual activity: Never  Other Topics Concern  . Not on file  Social History Narrative  . Not on file   Social Determinants of Health   Financial Resource Strain: Not on file  Food Insecurity: Not on file  Transportation Needs: Not on file  Physical Activity: Not on file  Stress: Not on file  Social Connections: Not on file   Family History  Problem Relation Age of Onset  . COPD Brother   . Cancer Sister        brain tumor with hemmorhage  . Heart disease Sister        CAD   Allergies  Allergen Reactions  . Diclofenac Sodium Other (See Comments)    REACTION: angioedema  . Pioglitazone Swelling   Prior to Admission medications   Medication Sig Start Date End Date Taking? Authorizing Provider  albuterol (  VENTOLIN HFA) 108 (90 Base) MCG/ACT inhaler INHALE 2 PUFFS INTO THE LUNGS EVERY 4 HOURS AS NEEDED FOR WHEEZING/SHORTNESS OF BREATH 10/21/20   Tower, Wynelle Fanny, MD  ALPRAZolam [name]) 0.5 MG tablet Take 0.5-1 tablets (0.25-0.5 mg total) by mouth 2 (two) times daily as needed for anxiety. 11/28/20   [name], MD  amLODipine (NORVASC) 10 MG tablet Take 1 tablet (10 mg total) by mouth daily. 09/28/20   [name], MD  Blood Glucose Monitoring Suppl (ONE TOUCH ULTRA 2) w/Device KIT Check blood sugar once daily and as directed. Dx E11.9 08/27/18   Tower, Wynelle Fanny, MD  calcium-vitamin D (OSCAL WITH D) 500-200 MG-UNIT per tablet Take 1 tablet by mouth daily.    [provider]  cephALEXin (KEFLEX) 500 MG capsule Take 1 capsule (500 mg total) by mouth daily. 09/26/20   [name], MD  Cholecalciferol (VITAMIN D) 1000 UNITS capsule Take 1,000 Units by mouth daily.     [provider]  Cranberry 500 MG TABS Take 500 mg by mouth in the morning and at bedtime.    [provider]  docusate sodium (COLACE) 100 MG capsule Take 200 mg by mouth at bedtime.    [provider]  glipiZIDE (GLIPIZIDE XL) 5 MG 24 hr tablet Take 1 tablet (5 mg total) by mouth daily with breakfast. 10/18/20   [name], MD  hydrALAZINE (APRESOLINE) 100 MG tablet Take 1 tablet (100 mg total) by mouth 3 (three) times daily. 09/28/20   [name], MD  Lancets (ONETOUCH DELICA PLUS [id number]) MISC CHECK BLOOD SUGAR ONCE DAILY AND AS DIRECTED. DX E11.9 08/24/20   Tower, Wynelle Fanny, MD  metoprolol succinate (TOPROL-XL) 25 MG 24 hr tablet Take 0.5 tablets (12.5 mg total) by mouth 2 (two) times daily. 09/28/20   [name], MD  mirtazapine (REMERON) 15 MG tablet TAKE 1/2 TABLET BY MOUTH AT BEDTIME 08/01/20   Tower, Wynelle Fanny, MD  Multiple Vitamin (MULTIVITAMIN) capsule Take 1 capsule by mouth daily.    [provider]  Multiple Vitamins-Minerals (PRESERVISION AREDS 2+MULTI VIT) CAPS Take 1 capsule by mouth in the morning and at bedtime.    [provider]  Olopatadine HCl (PATADAY OP) Place 1 drop into both eyes daily.    [provider]  ONETOUCH ULTRA test strip CHECK BLOOD SUGAR ONCE DAILY AND AS DIRECTED. DX E11.9 04/05/20   Tower, Wynelle Fanny, MD  sertraline (ZOLOFT) 50 MG tablet Take 1 tablet (50 mg total) by mouth daily. 09/28/20   [name], MD  sitaGLIPtin (JANUVIA) 100 MG tablet Take 1 tablet (100 mg total) by mouth daily. 09/28/20   [name], MD  sulfamethoxazole-trimethoprim (BACTRIM DS) 800-160 MG tablet Take 1 tablet by mouth 2 (two) times daily. 12/09/20   [name], MD  trandolapril (MAVIK) 4 MG tablet Take 1 tablet (4 mg total) by mouth daily. 09/28/20   [name], MD  triamcinolone (NASACORT) 55 MCG/ACT AERO nasal inhaler Place 2 sprays into the nose daily. Patient taking differently: Place 2 sprays  into the nose at bedtime. 03/28/20   [name], MD  vitamin B-12 (CYANOCOBALAMIN) 1000 MCG tablet Take 1,000 mcg by mouth daily.     [provider]     Positive ROS: Otherwise negative  All other systems have been reviewed and were otherwise negative with the exception of those mentioned in the HPI and as above.  Physical Exam: Constitutional: Alert, well-appearing,  no acute distress Ears: External ears without lesions or tenderness.  She had wax buildup in both ear canals that was cleaned in the office.  After cleaning the wax she had bilateral serous otitis media with retracted TMs.  Because of a chronic problem with serous otitis media I went ahead and performed bilateral M&T's in the office today.  Of note she had no positional vertigo on Dix-Hallpike testing.  Following the BMTs she was able to hear much better with her hearing aids. Nasal: External nose without lesions. Septum with mild deformity.  No mucopurulent discharge noted.. Clear nasal passages bilaterally. Oral: Lips and gums without lesions. Tongue and palate mucosa without lesions. Posterior oropharynx clear. Neck: No palpable adenopathy or masses Respiratory: Breathing comfortably  Skin: No facial/neck lesions or rash noted.  Myringotomy  Date/Time: 12/21/2020 12:45 PM Performed by: [name], MD Authorized by: [name], MD   Consent:    Consent obtained:  Verbal   Consent given by:  Patient   Risks discussed:  Bleeding and pain   Alternatives discussed:  No treatment Pre-procedure details:    Indications: serous otitis media   Anesthesia:    Anesthesia method:  Topical application   Topical anesthetic:  Phenol Procedure Details:    Location:  Left TM and right TM   Hole made in:  Anteroinferior aspect of TM   Tube:  Paparella Type 1 Findings:    Fluid:  Serous fluid Post-procedure details:    Patient tolerance of procedure:  Tolerated well, no immediate  complications Comments:     Bilateral myringotomy and tubes were performed in the office today.  Palpable type I tubes were inserted in both TMs following myringotomy.  A serous effusion was aspirated bilaterally.    Assessment: Bilateral serous otitis media with conductive hearing loss in addition to bilateral sensorineural hearing loss.  Plan: Performed bilateral myringotomy and tubes in the office today using palpable type I tubes. Discussed with caregiver concerning continuation with the nasal steroid spray which she has been using. Also discussed with her caregiver that she will probably have drainage for a day or 2 but if she has any persistent drainage beyond 2 days they will call us back concerning obtaining prescription for antibiotic eardrops. She will follow-up in 3 weeks for recheck and the myringotomy tubes.   Radene Journey, MD

## 2020-12-22 DIAGNOSIS — I700 Atherosclerosis of aorta: Secondary | ICD-10-CM | POA: Diagnosis not present

## 2020-12-22 DIAGNOSIS — N184 Chronic kidney disease, stage 4 (severe): Secondary | ICD-10-CM | POA: Diagnosis not present

## 2020-12-22 DIAGNOSIS — E1122 Type 2 diabetes mellitus with diabetic chronic kidney disease: Secondary | ICD-10-CM | POA: Diagnosis not present

## 2020-12-22 DIAGNOSIS — I129 Hypertensive chronic kidney disease with stage 1 through stage 4 chronic kidney disease, or unspecified chronic kidney disease: Secondary | ICD-10-CM | POA: Diagnosis not present

## 2020-12-22 DIAGNOSIS — E785 Hyperlipidemia, unspecified: Secondary | ICD-10-CM | POA: Diagnosis not present

## 2020-12-22 DIAGNOSIS — E11319 Type 2 diabetes mellitus with unspecified diabetic retinopathy without macular edema: Secondary | ICD-10-CM | POA: Diagnosis not present

## 2020-12-22 DIAGNOSIS — E1169 Type 2 diabetes mellitus with other specified complication: Secondary | ICD-10-CM | POA: Diagnosis not present

## 2020-12-22 DIAGNOSIS — E860 Dehydration: Secondary | ICD-10-CM | POA: Diagnosis not present

## 2020-12-22 DIAGNOSIS — R69 Illness, unspecified: Secondary | ICD-10-CM | POA: Diagnosis not present

## 2020-12-26 ENCOUNTER — Other Ambulatory Visit: Payer: Self-pay | Admitting: Family Medicine

## 2020-12-26 DIAGNOSIS — E1122 Type 2 diabetes mellitus with diabetic chronic kidney disease: Secondary | ICD-10-CM | POA: Diagnosis not present

## 2020-12-26 DIAGNOSIS — I700 Atherosclerosis of aorta: Secondary | ICD-10-CM | POA: Diagnosis not present

## 2020-12-26 DIAGNOSIS — E11319 Type 2 diabetes mellitus with unspecified diabetic retinopathy without macular edema: Secondary | ICD-10-CM | POA: Diagnosis not present

## 2020-12-26 DIAGNOSIS — E1169 Type 2 diabetes mellitus with other specified complication: Secondary | ICD-10-CM | POA: Diagnosis not present

## 2020-12-26 DIAGNOSIS — E785 Hyperlipidemia, unspecified: Secondary | ICD-10-CM | POA: Diagnosis not present

## 2020-12-26 DIAGNOSIS — E860 Dehydration: Secondary | ICD-10-CM | POA: Diagnosis not present

## 2020-12-26 DIAGNOSIS — N184 Chronic kidney disease, stage 4 (severe): Secondary | ICD-10-CM | POA: Diagnosis not present

## 2020-12-26 DIAGNOSIS — I129 Hypertensive chronic kidney disease with stage 1 through stage 4 chronic kidney disease, or unspecified chronic kidney disease: Secondary | ICD-10-CM | POA: Diagnosis not present

## 2020-12-26 DIAGNOSIS — R69 Illness, unspecified: Secondary | ICD-10-CM | POA: Diagnosis not present

## 2020-12-29 DIAGNOSIS — I700 Atherosclerosis of aorta: Secondary | ICD-10-CM | POA: Diagnosis not present

## 2020-12-29 DIAGNOSIS — E11319 Type 2 diabetes mellitus with unspecified diabetic retinopathy without macular edema: Secondary | ICD-10-CM | POA: Diagnosis not present

## 2020-12-29 DIAGNOSIS — E785 Hyperlipidemia, unspecified: Secondary | ICD-10-CM | POA: Diagnosis not present

## 2020-12-29 DIAGNOSIS — N184 Chronic kidney disease, stage 4 (severe): Secondary | ICD-10-CM | POA: Diagnosis not present

## 2020-12-29 DIAGNOSIS — E1169 Type 2 diabetes mellitus with other specified complication: Secondary | ICD-10-CM | POA: Diagnosis not present

## 2020-12-29 DIAGNOSIS — R69 Illness, unspecified: Secondary | ICD-10-CM | POA: Diagnosis not present

## 2020-12-29 DIAGNOSIS — E1122 Type 2 diabetes mellitus with diabetic chronic kidney disease: Secondary | ICD-10-CM | POA: Diagnosis not present

## 2020-12-29 DIAGNOSIS — I129 Hypertensive chronic kidney disease with stage 1 through stage 4 chronic kidney disease, or unspecified chronic kidney disease: Secondary | ICD-10-CM | POA: Diagnosis not present

## 2020-12-29 DIAGNOSIS — E860 Dehydration: Secondary | ICD-10-CM | POA: Diagnosis not present

## 2020-12-30 DIAGNOSIS — I129 Hypertensive chronic kidney disease with stage 1 through stage 4 chronic kidney disease, or unspecified chronic kidney disease: Secondary | ICD-10-CM | POA: Diagnosis not present

## 2020-12-30 DIAGNOSIS — E785 Hyperlipidemia, unspecified: Secondary | ICD-10-CM | POA: Diagnosis not present

## 2020-12-30 DIAGNOSIS — E11319 Type 2 diabetes mellitus with unspecified diabetic retinopathy without macular edema: Secondary | ICD-10-CM | POA: Diagnosis not present

## 2020-12-30 DIAGNOSIS — N184 Chronic kidney disease, stage 4 (severe): Secondary | ICD-10-CM | POA: Diagnosis not present

## 2020-12-30 DIAGNOSIS — E1169 Type 2 diabetes mellitus with other specified complication: Secondary | ICD-10-CM | POA: Diagnosis not present

## 2020-12-30 DIAGNOSIS — E860 Dehydration: Secondary | ICD-10-CM | POA: Diagnosis not present

## 2020-12-30 DIAGNOSIS — I700 Atherosclerosis of aorta: Secondary | ICD-10-CM | POA: Diagnosis not present

## 2020-12-30 DIAGNOSIS — R69 Illness, unspecified: Secondary | ICD-10-CM | POA: Diagnosis not present

## 2020-12-30 DIAGNOSIS — E1122 Type 2 diabetes mellitus with diabetic chronic kidney disease: Secondary | ICD-10-CM | POA: Diagnosis not present

## 2021-01-02 DIAGNOSIS — I10 Essential (primary) hypertension: Secondary | ICD-10-CM | POA: Diagnosis not present

## 2021-01-02 DIAGNOSIS — R600 Localized edema: Secondary | ICD-10-CM | POA: Diagnosis not present

## 2021-01-02 DIAGNOSIS — N1832 Chronic kidney disease, stage 3b: Secondary | ICD-10-CM | POA: Diagnosis not present

## 2021-01-02 DIAGNOSIS — N2581 Secondary hyperparathyroidism of renal origin: Secondary | ICD-10-CM | POA: Diagnosis not present

## 2021-01-03 DIAGNOSIS — I129 Hypertensive chronic kidney disease with stage 1 through stage 4 chronic kidney disease, or unspecified chronic kidney disease: Secondary | ICD-10-CM | POA: Diagnosis not present

## 2021-01-03 DIAGNOSIS — E1169 Type 2 diabetes mellitus with other specified complication: Secondary | ICD-10-CM | POA: Diagnosis not present

## 2021-01-03 DIAGNOSIS — E11319 Type 2 diabetes mellitus with unspecified diabetic retinopathy without macular edema: Secondary | ICD-10-CM | POA: Diagnosis not present

## 2021-01-03 DIAGNOSIS — R69 Illness, unspecified: Secondary | ICD-10-CM | POA: Diagnosis not present

## 2021-01-03 DIAGNOSIS — E785 Hyperlipidemia, unspecified: Secondary | ICD-10-CM | POA: Diagnosis not present

## 2021-01-03 DIAGNOSIS — E860 Dehydration: Secondary | ICD-10-CM | POA: Diagnosis not present

## 2021-01-03 DIAGNOSIS — I700 Atherosclerosis of aorta: Secondary | ICD-10-CM | POA: Diagnosis not present

## 2021-01-03 DIAGNOSIS — N184 Chronic kidney disease, stage 4 (severe): Secondary | ICD-10-CM | POA: Diagnosis not present

## 2021-01-03 DIAGNOSIS — E1122 Type 2 diabetes mellitus with diabetic chronic kidney disease: Secondary | ICD-10-CM | POA: Diagnosis not present

## 2021-01-04 DIAGNOSIS — E1122 Type 2 diabetes mellitus with diabetic chronic kidney disease: Secondary | ICD-10-CM | POA: Diagnosis not present

## 2021-01-04 DIAGNOSIS — E785 Hyperlipidemia, unspecified: Secondary | ICD-10-CM | POA: Diagnosis not present

## 2021-01-04 DIAGNOSIS — R69 Illness, unspecified: Secondary | ICD-10-CM | POA: Diagnosis not present

## 2021-01-04 DIAGNOSIS — E1169 Type 2 diabetes mellitus with other specified complication: Secondary | ICD-10-CM | POA: Diagnosis not present

## 2021-01-04 DIAGNOSIS — I129 Hypertensive chronic kidney disease with stage 1 through stage 4 chronic kidney disease, or unspecified chronic kidney disease: Secondary | ICD-10-CM | POA: Diagnosis not present

## 2021-01-04 DIAGNOSIS — E860 Dehydration: Secondary | ICD-10-CM | POA: Diagnosis not present

## 2021-01-04 DIAGNOSIS — E11319 Type 2 diabetes mellitus with unspecified diabetic retinopathy without macular edema: Secondary | ICD-10-CM | POA: Diagnosis not present

## 2021-01-04 DIAGNOSIS — N184 Chronic kidney disease, stage 4 (severe): Secondary | ICD-10-CM | POA: Diagnosis not present

## 2021-01-04 DIAGNOSIS — I700 Atherosclerosis of aorta: Secondary | ICD-10-CM | POA: Diagnosis not present

## 2021-01-11 ENCOUNTER — Other Ambulatory Visit: Payer: Self-pay

## 2021-01-11 ENCOUNTER — Ambulatory Visit (INDEPENDENT_AMBULATORY_CARE_PROVIDER_SITE_OTHER): Payer: Medicare HMO | Admitting: Otolaryngology

## 2021-01-11 DIAGNOSIS — H6983 Other specified disorders of Eustachian tube, bilateral: Secondary | ICD-10-CM

## 2021-01-11 DIAGNOSIS — H903 Sensorineural hearing loss, bilateral: Secondary | ICD-10-CM

## 2021-01-11 NOTE — Progress Notes (Signed)
HPI: Renee [name] is a 87 y.o. female who returns today for evaluation of hearing 3 weeks status post placement of bilateral palpable type I tubes because of serous otitis media with severe underlying sensorineural hearing loss in both ears.  She wears hearing aids in both ears.  She is doing better since placement of the myringotomy tubes.  She has had no drainage..  Past Medical History:  Diagnosis Date  . Angioedema    possibly from voltaren  . Bronchopneumonia 12/11/2016  . Degenerative disc disease   . Diabetes mellitus    type II  . Hyperlipidemia   . Hypertension   . LVH (left ventricular hypertrophy)    and atrial enlargement by echo in past with nl EF  . Nasal pruritis   . Osteoarthritis   . Osteopenia   . Renal insufficiency   . Sleep apnea   . Stroke [facility]) 05/2010   Small vessel sobcortical (in [place] trial) with Dr Leonie Man, residual L hemiparesis  . Vitamin B 12 deficiency 04/08   Past Surgical History:  Procedure Laterality Date  . ABDOMINAL HYSTERECTOMY     BSO-fibroids  . APPENDECTOMY    . BACK SURGERY    . COLON SURGERY     due to punctured intestines  . EYE SURGERY     cataract extraction  . IR THORACENTESIS ASP PLEURAL SPACE W/IMG GUIDE  06/07/2020  . KNEE SURGERY     arthroscope  . PARS PLANA VITRECTOMY  07/31/2011   Procedure: PARS PLANA VITRECTOMY WITH 25 GAUGE;  Surgeon: [name], MD;  Location: [facility];  Service: Ophthalmology;  Laterality: Right;  REMOVAL OF SILICONE OIL AND LASER RIGHT EYE  . RETINAL DETACHMENT SURGERY  02/18/11   times 2  . SPINE SURGERY  08/09   spinal decompression surgery   Social History   Socioeconomic History  . Marital status: Widowed    Spouse name: Not on file  . Number of children: Not on file  . Years of education: Not on file  . Highest education level: Not on file  Occupational History  . Not on file  Tobacco Use  . Smoking status: Never Smoker  . Smokeless tobacco: Never Used  Vaping Use  . Vaping  Use: Not on file  Substance and Sexual Activity  . Alcohol use: No    Alcohol/week: 0.0 standard drinks  . Drug use: No  . Sexual activity: Never  Other Topics Concern  . Not on file  Social History Narrative  . Not on file   Social Determinants of Health   Financial Resource Strain: Not on file  Food Insecurity: Not on file  Transportation Needs: Not on file  Physical Activity: Not on file  Stress: Not on file  Social Connections: Not on file   Family History  Problem Relation Age of Onset  . COPD Brother   . Cancer Sister        brain tumor with hemmorhage  . Heart disease Sister        CAD   Allergies  Allergen Reactions  . Diclofenac Sodium Other (See Comments)    REACTION: angioedema  . Pioglitazone Swelling   Prior to Admission medications   Medication Sig Start Date End Date Taking? Authorizing Provider  ONETOUCH ULTRA test strip CHECK BLOOD SUGAR ONCE DAILY AND AS DIRECTED. DX E11.9 12/27/20   [name], MD  albuterol (VENTOLIN HFA) 108 (90 Base) MCG/ACT inhaler INHALE 2 PUFFS INTO THE LUNGS EVERY 4 HOURS  AS NEEDED FOR WHEEZING/SHORTNESS OF BREATH 10/21/20   Tower, Wynelle Fanny, MD  ALPRAZolam [name]) 0.5 MG tablet Take 0.5-1 tablets (0.25-0.5 mg total) by mouth 2 (two) times daily as needed for anxiety. 11/28/20   [name], MD  amLODipine (NORVASC) 10 MG tablet Take 1 tablet (10 mg total) by mouth daily. 09/28/20   [name], MD  Blood Glucose Monitoring Suppl (ONE TOUCH ULTRA 2) w/Device KIT Check blood sugar once daily and as directed. Dx E11.9 08/27/18   Tower, Wynelle Fanny, MD  calcium-vitamin D (OSCAL WITH D) 500-200 MG-UNIT per tablet Take 1 tablet by mouth daily.    [provider]  cephALEXin (KEFLEX) 500 MG capsule Take 1 capsule (500 mg total) by mouth daily. 09/26/20   [name], MD  Cholecalciferol (VITAMIN D) 1000 UNITS capsule Take 1,000 Units by mouth daily.    [provider]  Cranberry 500 MG TABS Take 500 mg by  mouth in the morning and at bedtime.    [provider]  docusate sodium (COLACE) 100 MG capsule Take 200 mg by mouth at bedtime.    [provider]  glipiZIDE (GLIPIZIDE XL) 5 MG 24 hr tablet Take 1 tablet (5 mg total) by mouth daily with breakfast. 10/18/20   [name], MD  hydrALAZINE (APRESOLINE) 100 MG tablet Take 1 tablet (100 mg total) by mouth 3 (three) times daily. 09/28/20   [name], MD  Lancets (ONETOUCH DELICA PLUS DHRCBU38G) MISC CHECK BLOOD SUGAR ONCE DAILY AND AS DIRECTED. DX E11.9 08/24/20   Tower, Wynelle Fanny, MD  metoprolol succinate (TOPROL-XL) 25 MG 24 hr tablet Take 0.5 tablets (12.5 mg total) by mouth 2 (two) times daily. 09/28/20   [name], MD  mirtazapine (REMERON) 15 MG tablet TAKE 1/2 TABLET BY MOUTH AT BEDTIME 08/01/20   Tower, Wynelle Fanny, MD  Multiple Vitamin (MULTIVITAMIN) capsule Take 1 capsule by mouth daily.    [provider]  Multiple Vitamins-Minerals (PRESERVISION AREDS 2+MULTI VIT) CAPS Take 1 capsule by mouth in the morning and at bedtime.    [provider]  Olopatadine HCl (PATADAY OP) Place 1 drop into both eyes daily.    [provider]  sertraline (ZOLOFT) 50 MG tablet Take 1 tablet (50 mg total) by mouth daily. 09/28/20   [name], MD  sitaGLIPtin (JANUVIA) 100 MG tablet Take 1 tablet (100 mg total) by mouth daily. 09/28/20   [name], MD  sulfamethoxazole-trimethoprim (BACTRIM DS) 800-160 MG tablet Take 1 tablet by mouth 2 (two) times daily. 12/09/20   [name], MD  trandolapril (MAVIK) 4 MG tablet Take 1 tablet (4 mg total) by mouth daily. 09/28/20   [name], MD  triamcinolone (NASACORT) 55 MCG/ACT AERO nasal inhaler Place 2 sprays into the nose daily. Patient taking differently: Place 2 sprays into the nose at bedtime. 03/28/20   [name], MD  vitamin B-12 (CYANOCOBALAMIN) 1000 MCG tablet Take 1,000 mcg by mouth daily.     [provider]      Positive ROS: Otherwise normal.  All other systems have been reviewed and were otherwise negative with the exception of those mentioned in the HPI and as above.  Physical Exam: Constitutional: Alert, well-appearing, no acute distress Ears: External ears without lesions or tenderness. Ear canals are clear bilaterally.  Both palpable type I tubes are in good position and are clear with clear TMs bilaterally.  No drainage noted. Nasal: External  nose without lesions. . Clear nasal passages Oral: Lips and gums without lesions. Tongue and palate mucosa without lesions. Posterior oropharynx clear. Neck: No palpable adenopathy or masses Respiratory: Breathing comfortably  Skin: No facial/neck lesions or rash noted.  Procedures  Assessment: Status post placement of BMTs ([place] type I tubes) and patient with severe hearing loss who uses hearing aids. Hearing better since placement of the BMTs.  Plan: She will follow-up as needed.   Radene Journey, MD

## 2021-01-12 DIAGNOSIS — E1122 Type 2 diabetes mellitus with diabetic chronic kidney disease: Secondary | ICD-10-CM | POA: Diagnosis not present

## 2021-01-12 DIAGNOSIS — I129 Hypertensive chronic kidney disease with stage 1 through stage 4 chronic kidney disease, or unspecified chronic kidney disease: Secondary | ICD-10-CM | POA: Diagnosis not present

## 2021-01-12 DIAGNOSIS — E11319 Type 2 diabetes mellitus with unspecified diabetic retinopathy without macular edema: Secondary | ICD-10-CM | POA: Diagnosis not present

## 2021-01-12 DIAGNOSIS — N184 Chronic kidney disease, stage 4 (severe): Secondary | ICD-10-CM | POA: Diagnosis not present

## 2021-01-12 DIAGNOSIS — R69 Illness, unspecified: Secondary | ICD-10-CM | POA: Diagnosis not present

## 2021-01-12 DIAGNOSIS — I700 Atherosclerosis of aorta: Secondary | ICD-10-CM | POA: Diagnosis not present

## 2021-01-12 DIAGNOSIS — E860 Dehydration: Secondary | ICD-10-CM | POA: Diagnosis not present

## 2021-01-12 DIAGNOSIS — E1169 Type 2 diabetes mellitus with other specified complication: Secondary | ICD-10-CM | POA: Diagnosis not present

## 2021-01-12 DIAGNOSIS — E785 Hyperlipidemia, unspecified: Secondary | ICD-10-CM | POA: Diagnosis not present

## 2021-01-12 NOTE — Progress Notes (Signed)
Subjective:   Renee [name] is a 87 y.o. female who presents for Medicare Annual (Subsequent) preventive examination.     Review of Systems    N/A  Cardiac Risk Factors include: advanced age (>80men, >62 women);hypertension;dyslipidemia;diabetes mellitus;obesity (BMI >30kg/m2)     Objective:    Today's Vitals   01/13/21 1215  BP: 136/66  Temp: 97.9 F (36.6 C)  TempSrc: Oral  Weight: 176 lb (79.8 kg)  Height: 5\' 2"  (1.575 m)   Body mass index is 32.19 kg/m.  Advanced Directives 01/13/2021 06/07/2020 05/26/2020 02/12/2020 10/30/2019 07/06/2019 03/14/2018  Does Patient Have a Medical Advance Directive? Yes Yes Yes Yes No No Yes  Type of 03/16/2018 of [place];Living will Healthcare Power of [place] Power of Attorney Living will - - Healthcare Power of [place];Living will  Does patient want to make changes to medical advance directive? No - Patient declined No - Patient declined - Yes (Inpatient - patient defers changing a medical advance directive at this time - Information given) - - -  Copy of Healthcare Power of Attorney in Chart? Yes - validated most recent copy scanned in chart (See row information) No - copy requested - - - - No - copy requested  Would patient like information on creating a medical advance directive? - - - - No - Patient declined No - Patient declined -  Pre-existing out of facility DNR order (yellow form or pink MOST form) - - - - - - -    Current Medications (verified) Outpatient Encounter Medications as of 01/13/2021  Medication Sig  . albuterol (VENTOLIN HFA) 108 (90 Base) MCG/ACT inhaler INHALE 2 PUFFS INTO THE LUNGS EVERY 4 HOURS AS NEEDED FOR WHEEZING/SHORTNESS OF BREATH  . ALPRAZolam (XANAX) 0.5 MG tablet Take 0.5-1 tablets (0.25-0.5 mg total) by mouth 2 (two) times daily as needed for anxiety.  01/15/2021 amLODipine (NORVASC) 10 MG tablet Take 1 tablet (10 mg total) by mouth daily.  . Blood Glucose Monitoring Suppl (ONE  TOUCH ULTRA 2) w/Device KIT Check blood sugar once daily and as directed. Dx E11.9  . calcium-vitamin D (OSCAL WITH D) 500-200 MG-UNIT per tablet Take 1 tablet by mouth daily.  . cephALEXin (KEFLEX) 500 MG capsule Take 1 capsule (500 mg total) by mouth daily.  . Cholecalciferol (VITAMIN D) 1000 UNITS capsule Take 1,000 Units by mouth daily.  . Cranberry 500 MG TABS Take 500 mg by mouth in the morning and at bedtime.  . docusate sodium (COLACE) 100 MG capsule Take 200 mg by mouth at bedtime.  Marland Kitchen glipiZIDE (GLIPIZIDE XL) 5 MG 24 hr tablet Take 1 tablet (5 mg total) by mouth daily with breakfast.  . hydrALAZINE (APRESOLINE) 100 MG tablet Take 1 tablet (100 mg total) by mouth 3 (three) times daily.  . Lancets (ONETOUCH DELICA PLUS LANCET30G) MISC CHECK BLOOD SUGAR ONCE DAILY AND AS DIRECTED. DX E11.9  . metoprolol succinate (TOPROL-XL) 25 MG 24 hr tablet Take 0.5 tablets (12.5 mg total) by mouth 2 (two) times daily.  . mirtazapine (REMERON) 15 MG tablet TAKE 1/2 TABLET BY MOUTH AT BEDTIME  . Multiple Vitamin (MULTIVITAMIN) capsule Take 1 capsule by mouth daily.  . Multiple Vitamins-Minerals (PRESERVISION AREDS 2+MULTI VIT) CAPS Take 1 capsule by mouth in the morning and at bedtime.  . Olopatadine HCl (PATADAY OP) Place 1 drop into both eyes daily.  Marland Kitchen ULTRA test strip CHECK BLOOD SUGAR ONCE DAILY AND AS DIRECTED. DX E11.9  . sertraline (ZOLOFT) 50 MG  tablet Take 1 tablet (50 mg total) by mouth daily.  . sitaGLIPtin (JANUVIA) 100 MG tablet Take 1 tablet (100 mg total) by mouth daily.  . trandolapril (MAVIK) 4 MG tablet Take 1 tablet (4 mg total) by mouth daily.  Marland Kitchen triamcinolone (NASACORT) 55 MCG/ACT AERO nasal inhaler Place 2 sprays into the nose daily. (Patient taking differently: Place 2 sprays into the nose at bedtime.)  . vitamin B-12 (CYANOCOBALAMIN) 1000 MCG tablet Take 1,000 mcg by mouth daily.   . [DISCONTINUED] sulfamethoxazole-trimethoprim (BACTRIM DS) 800-160 MG tablet Take 1 tablet  by mouth 2 (two) times daily.   No facility-administered encounter medications on file as of 01/13/2021.    Allergies (verified) Diclofenac sodium and Pioglitazone   History: Past Medical History:  Diagnosis Date  . Angioedema    possibly from voltaren  . Bronchopneumonia 12/11/2016  . Degenerative disc disease   . Diabetes mellitus    type II  . Hyperlipidemia   . Hypertension   . LVH (left ventricular hypertrophy)    and atrial enlargement by echo in past with nl EF  . Nasal pruritis   . Osteoarthritis   . Osteopenia   . Renal insufficiency   . Sleep apnea   . Stroke [facility]) 05/2010   Small vessel sobcortical (in [place] trial) with Dr Leonie Man, residual L hemiparesis  . Vitamin B 12 deficiency 04/08   Past Surgical History:  Procedure Laterality Date  . ABDOMINAL HYSTERECTOMY     BSO-fibroids  . APPENDECTOMY    . BACK SURGERY    . COLON SURGERY     due to punctured intestines  . EYE SURGERY     cataract extraction  . IR THORACENTESIS ASP PLEURAL SPACE W/IMG GUIDE  06/07/2020  . KNEE SURGERY     arthroscope  . PARS PLANA VITRECTOMY  07/31/2011   Procedure: PARS PLANA VITRECTOMY WITH 25 GAUGE;  Surgeon: [name], MD;  Location: [facility];  Service: Ophthalmology;  Laterality: Right;  REMOVAL OF SILICONE OIL AND LASER RIGHT EYE  . RETINAL DETACHMENT SURGERY  02/18/11   times 2  . SPINE SURGERY  08/09   spinal decompression surgery   Family History  Problem Relation Age of Onset  . COPD Brother   . Cancer Sister        brain tumor with hemmorhage  . Heart disease Sister        CAD   Social History   Socioeconomic History  . Marital status: Widowed    Spouse name: Not on file  . Number of children: Not on file  . Years of education: Not on file  . Highest education level: Not on file  Occupational History  . Not on file  Tobacco Use  . Smoking status: Never Smoker  . Smokeless tobacco: Never Used  Vaping Use  . Vaping Use: Not on file  Substance and  Sexual Activity  . Alcohol use: No    Alcohol/week: 0.0 standard drinks  . Drug use: No  . Sexual activity: Never  Other Topics Concern  . Not on file  Social History Narrative  . Not on file   Social Determinants of Health   Financial Resource Strain: Low Risk   . Difficulty of Paying Living Expenses: Not hard at all  Food Insecurity: No Food Insecurity  . Worried About Charity fundraiser in the Last Year: Never true  . Ran Out of Food in the Last Year: Never true  Transportation Needs: No Transportation Needs  .  Lack of Transportation (Medical): No  . Lack of Transportation (Non-Medical): No  Physical Activity: Inactive  . Days of Exercise per Week: 0 days  . Minutes of Exercise per Session: 0 min  Stress: No Stress Concern Present  . Feeling of Stress : Not at all  Social Connections: Socially Isolated  . Frequency of Communication with Friends and Family: More than three times a week  . Frequency of Social Gatherings with Friends and Family: Once a week  . Attends Religious Services: Never  . Active Member of Clubs or Organizations: No  . Attends Archivist Meetings: Never  . Marital Status: Widowed    Tobacco Counseling Counseling given: Not Answered   Clinical Intake:  Pre-visit preparation completed: Yes  Pain : No/denies pain     Nutritional Risks: None Diabetes: Yes CBG done?: No (Glucose fasting today at home was 159) Did pt. bring in CBG monitor from home?: No  How often do you need to have someone help you when you read instructions, pamphlets, or other written materials from your doctor or pharmacy?: 1 - Never  Diabetic?Yes Nutrition Risk Assessment:  Has the patient had any N/V/D within the last 2 months?  No  Does the patient have any non-healing wounds?  No  Has the patient had any unintentional weight loss or weight gain?  No   Diabetes:  Is the patient diabetic?  Yes  If diabetic, was a CBG obtained today?  No  Did the  patient bring in their glucometer from home?  No  How often do you monitor your CBG's? Patient states checks glucose once daily .   Financial Strains and Diabetes Management:  Are you having any financial strains with the device, your supplies or your medication? No .  Does the patient want to be seen by Chronic Care Management for management of their diabetes?  No  Would the patient like to be referred to a Nutritionist or for Diabetic Management?  No   Diabetic Exams:  Diabetic Eye Exam: Overdue for diabetic eye exam. Pt has been advised about the importance in completing this exam. Patient advised to call and schedule an eye exam. Diabetic Foot Exam: Overdue, Pt has been advised about the importance in completing this exam. Pt is scheduled for diabetic foot exam on next scheduled appointment .   Interpreter Needed?: No  Information entered by :: [place] of Daily Living In your present state of health, do you have any difficulty performing the following activities: 01/13/2021 06/07/2020  Hearing? Tempie Donning  Comment Currently wears hearing aids bilaterally -  Vision? Y N  Comment has some blurred -  Difficulty concentrating or making decisions? N N  Walking or climbing stairs? Y Y  Dressing or bathing? Y Y  Doing errands, shopping? Tempie Donning  Preparing Food and eating ? Y -  Comment does not prepare food -  Using the Toilet? N -  In the past six months, have you accidently leaked urine? N -  Do you have problems with loss of bowel control? N -  Managing your Medications? Y -  Managing your Finances? Y -  Housekeeping or managing your Housekeeping? Y -  Some recent data might be hidden    Patient Care Team: [name], MD as PCP - General (Family Medicine) Dagoberto Ligas, MD as Referring Physician (Internal Medicine) Webb Laws, [place] as Referring Physician (Optometry) Caralee Ates, DDS as Consulting Physician (Dentistry)  Indicate any recent  Medical Services you may have received from other than [facility] providers in the past year (date may be approximate).     Assessment:   This is a routine wellness examination for [place].  Hearing/Vision screen  Hearing Screening   '125Hz'$  $Remo'250Hz'kTbOc$'500Hz'$'1000Hz'$'2000Hz'$'3000Hz'$'4000Hz'$'6000Hz'$'8000Hz'$   Right ear:           Left ear:           Vision Screening Comments: Patient states gets eyes examined once per year. Has some blurred vision even with glasses  Dietary issues and exercise activities discussed: Current Exercise Habits: The patient does not participate in regular exercise at present  Goals Addressed            This Visit's Progress   . Prevent falls        Depression Screen PHQ 2/9 Scores 01/13/2021 12/19/2020 12/01/2019 03/30/2019 03/14/2018 08/31/2016 08/30/2015  PHQ - 2 Score 0 0 0 0 0 0 0  PHQ- 9 Score - - - - 0 - -  Exception Documentation - - - Other- indicate reason in comment box - - -  Not completed - - - already tx for depression - - -    Fall Risk Fall Risk  01/13/2021 09/13/2020 03/30/2019 03/21/2018 03/14/2018  Falls in the past year? 0 0 0 Yes Yes  Comment - - - Emmi Telephone Survey: data to providers prior to load -  Number falls in past yr: 0 0 0 1 1  Comment - - - Emmi Telephone Survey Actual Response = 1 -  Injury with Fall? 0 0 0 No No  Risk for fall due to : Impaired balance/gait - - - Impaired balance/gait;Impaired mobility  Risk for fall due to: Comment - - - - -  Follow up Falls evaluation completed;Falls prevention discussed Falls evaluation completed Falls evaluation completed - -    FALL RISK PREVENTION PERTAINING TO THE HOME:  Any stairs in or around the home? Yes  If so, are there any without handrails? No  Home free of loose throw rugs in walkways, pet beds, electrical cords, etc? Yes  Adequate lighting in your home to reduce risk of falls? Yes   ASSISTIVE DEVICES UTILIZED TO PREVENT FALLS:  Life alert? Yes  Use of a cane, walker or w/c? Yes  Grab  bars in the bathroom? Yes  Shower chair or bench in shower? Yes  Elevated toilet seat or a handicapped toilet? Yes    Cognitive Function: Normal cognitive status assessed by direct observation by this Nurse Health Advisor. No abnormalities found.   MMSE - Mini Mental State Exam 03/14/2018 08/31/2016  Orientation to time 5 5  Orientation to Place 5 5  Registration 3 3  Attention/ Calculation 0 0  Recall 3 3  Language- name 2 objects 0 0  Language- repeat 1 1  Language- follow 3 step command 3 3  Language- read & follow direction 0 0  Write a sentence 0 0  Copy design 0 0  Total score 20 20        Immunizations Immunization History  Administered Date(s) Administered  . Fluad Quad(high Dose 65+) 06/02/2019, 05/13/2020  . Influenza Split 05/21/2011, 06/11/2012  . Influenza Whole 05/26/2004, 05/23/2010  . Influenza,inj,Quad PF,6+ Mos 04/17/2013, 04/27/2014, 05/20/2015, 05/22/2016, 05/22/2017, 05/22/2018  . PFIZER(Purple Top)SARS-COV-2 Vaccination 10/16/2019, 11/17/2019  . Pneumococcal Conjugate-13 04/27/2014  . Pneumococcal Polysaccharide-23 09/26/2005  . Td 05/21/1999, 04/04/2012  . Zoster Recombinat (Shingrix) 01/06/2020, 03/15/2020    TDAP status:  Up to date  Flu Vaccine status: Up to date  Pneumococcal vaccine status: Up to date  Covid-19 vaccine status: Completed vaccines  Qualifies for Shingles Vaccine? Yes   Zostavax completed No   Shingrix Completed?: Yes  Screening Tests Health Maintenance  Topic Date Due  . OPHTHALMOLOGY EXAM  02/04/2020  . COVID-19 Vaccine (3 - Booster for Pfizer series) 04/18/2020  . FOOT EXAM  01/04/2021  . INFLUENZA VACCINE  03/20/2021  . HEMOGLOBIN A1C  06/21/2021  . MAMMOGRAM  08/02/2021  . TETANUS/TDAP  04/04/2022  . DEXA SCAN  Completed  . PNA vac Low Risk Adult  Completed  . Zoster Vaccines- Shingrix  Completed  . HPV VACCINES  Aged Out    Health Maintenance  Health Maintenance Due  Topic Date Due  . OPHTHALMOLOGY  EXAM  02/04/2020  . COVID-19 Vaccine (3 - Booster for Pfizer series) 04/18/2020  . FOOT EXAM  01/04/2021    Colorectal cancer screening: No longer required.   Mammogram status: No longer required due to age.  Bone Density Status: No longer required   Lung Cancer Screening: (Low Dose CT Chest recommended if Age 57-80 years, 30 pack-year currently smoking OR have quit w/in 15years.) does not qualify.   Lung Cancer Screening Referral: N/A   Additional Screening:  Hepatitis C Screening: does not qualify;  Vision Screening: Recommended annual ophthalmology exams for early detection of glaucoma and other disorders of the eye. Is the patient up to date with their annual eye exam?  Yes  Who is the provider or what is the name of the office in which the patient attends annual eye exams? Dr. Einar Gip If pt is not established with a provider, would they like to be referred to a provider to establish care? No .   Dental Screening: Recommended annual dental exams for proper oral hygiene  Community Resource Referral / Chronic Care Management: CRR required this visit?  No   CCM required this visit?  No      Plan:     I have personally reviewed and noted the following in the patient's chart:   . Medical and social history . Use of alcohol, tobacco or illicit drugs  . Current medications and supplements including opioid prescriptions.  . Functional ability and status . Nutritional status . Physical activity . Advanced directives . List of other physicians . Hospitalizations, surgeries, and ER visits in previous 12 months . Vitals . Screenings to include cognitive, depression, and falls . Referrals and appointments  In addition, I have reviewed and discussed with patient certain preventive protocols, quality metrics, and best practice recommendations. A written personalized care plan for preventive services as well as general preventive health recommendations were provided to  patient.     Ofilia Neas, LPN   [id number]   Nurse Notes: None

## 2021-01-13 ENCOUNTER — Ambulatory Visit (INDEPENDENT_AMBULATORY_CARE_PROVIDER_SITE_OTHER): Payer: Medicare HMO

## 2021-01-13 VITALS — BP 136/66 | Temp 97.9°F | Ht 62.0 in | Wt 176.0 lb

## 2021-01-13 DIAGNOSIS — Z0000 Encounter for general adult medical examination without abnormal findings: Secondary | ICD-10-CM

## 2021-01-13 NOTE — Patient Instructions (Signed)
Renee [name] , Thank you for taking time to come for your Medicare Wellness Visit. I appreciate your ongoing commitment to your health goals. Please review the following plan we discussed and let me know if I can assist you in the future.   Screening recommendations/referrals: Colonoscopy: No longer required  Mammogram: No longer required Bone Density: No longer required Recommended yearly ophthalmology/optometry visit for glaucoma screening and checkup Recommended yearly dental visit for hygiene and checkup  Vaccinations: Influenza vaccine: Up to date, next due fall 2022  Pneumococcal vaccine: Completed series  Tdap vaccine: up to date, next due 04/04/2022 Shingles vaccine: Completed series     Advanced directives: Copies on file   Conditions/risks identified: none   Next appointment: None    Preventive Care 85 Years and Older, Female Preventive care refers to lifestyle choices and visits with your health care provider that can promote health and wellness. What does preventive care include?  A yearly physical exam. This is also called an annual well check.  Dental exams once or twice a year.  Routine eye exams. Ask your health care provider how often you should have your eyes checked.  Personal lifestyle choices, including:  Daily care of your teeth and gums.  Regular physical activity.  Eating a healthy diet.  Avoiding tobacco and drug use.  Limiting alcohol use.  Practicing safe sex.  Taking low-dose aspirin every day.  Taking vitamin and mineral supplements as recommended by your health care provider. What happens during an annual well check? The services and screenings done by your health care provider during your annual well check will depend on your age, overall health, lifestyle risk factors, and family history of disease. Counseling  Your health care provider may ask you questions about your:  Alcohol use.  Tobacco use.  Drug use.  Emotional  well-being.  Home and relationship well-being.  Sexual activity.  Eating habits.  History of falls.  Memory and ability to understand (cognition).  Work and work Statistician.  Reproductive health. Screening  You may have the following tests or measurements:  Height, weight, and BMI.  Blood pressure.  Lipid and cholesterol levels. These may be checked every 5 years, or more frequently if you are over 85 years old.  Skin check.  Lung cancer screening. You may have this screening every year starting at age 47 if you have a 30-pack-year history of smoking and currently smoke or have quit within the past 15 years.  Fecal occult blood test (FOBT) of the stool. You may have this test every year starting at age 36.  Flexible sigmoidoscopy or colonoscopy. You may have a sigmoidoscopy every 5 years or a colonoscopy every 10 years starting at age 65.  Hepatitis C blood test.  Hepatitis B blood test.  Sexually transmitted disease (STD) testing.  Diabetes screening. This is done by checking your blood sugar (glucose) after you have not eaten for a while (fasting). You may have this done every 1-3 years.  Bone density scan. This is done to screen for osteoporosis. You may have this done starting at age 22.  Mammogram. This may be done every 1-2 years. Talk to your health care provider about how often you should have regular mammograms. Talk with your health care provider about your test results, treatment options, and if necessary, the need for more tests. Vaccines  Your health care provider may recommend certain vaccines, such as:  Influenza vaccine. This is recommended every year.  Tetanus, diphtheria, and acellular pertussis (Tdap,  Td) vaccine. You may need a Td booster every 10 years.  Zoster vaccine. You may need this after age 20.  Pneumococcal 13-valent conjugate (PCV13) vaccine. One dose is recommended after age 70.  Pneumococcal polysaccharide (PPSV23) vaccine. One  dose is recommended after age 52. Talk to your health care provider about which screenings and vaccines you need and how often you need them. This information is not intended to replace advice given to you by your health care provider. Make sure you discuss any questions you have with your health care provider. Document Released: 09/02/2015 Document Revised: 04/25/2016 Document Reviewed: 06/07/2015 Elsevier Interactive Patient Education  2017 [employer] Prevention in the Home Falls can cause injuries. They can happen to people of all ages. There are many things you can do to make your home safe and to help prevent falls. What can I do on the outside of my home?  Regularly fix the edges of walkways and driveways and fix any cracks.  Remove anything that might make you trip as you walk through a door, such as a raised step or threshold.  Trim any bushes or trees on the path to your home.  Use bright outdoor lighting.  Clear any walking paths of anything that might make someone trip, such as rocks or tools.  Regularly check to see if handrails are loose or broken. Make sure that both sides of any steps have handrails.  Any raised decks and porches should have guardrails on the edges.  Have any leaves, snow, or ice cleared regularly.  Use sand or salt on walking paths during winter.  Clean up any spills in your garage right away. This includes oil or grease spills. What can I do in the bathroom?  Use night lights.  Install grab bars by the toilet and in the tub and shower. Do not use towel bars as grab bars.  Use non-skid mats or decals in the tub or shower.  If you need to sit down in the shower, use a plastic, non-slip stool.  Keep the floor dry. Clean up any water that spills on the floor as soon as it happens.  Remove soap buildup in the tub or shower regularly.  Attach bath mats securely with double-sided non-slip rug tape.  Do not have throw rugs and other  things on the floor that can make you trip. What can I do in the bedroom?  Use night lights.  Make sure that you have a light by your bed that is easy to reach.  Do not use any sheets or blankets that are too big for your bed. They should not hang down onto the floor.  Have a firm chair that has side arms. You can use this for support while you get dressed.  Do not have throw rugs and other things on the floor that can make you trip. What can I do in the kitchen?  Clean up any spills right away.  Avoid walking on wet floors.  Keep items that you use a lot in easy-to-reach places.  If you need to reach something above you, use a strong step stool that has a grab bar.  Keep electrical cords out of the way.  Do not use floor polish or wax that makes floors slippery. If you must use wax, use non-skid floor wax.  Do not have throw rugs and other things on the floor that can make you trip. What can I do with my stairs?  Do not  leave any items on the stairs.  Make sure that there are handrails on both sides of the stairs and use them. Fix handrails that are broken or loose. Make sure that handrails are as long as the stairways.  Check any carpeting to make sure that it is firmly attached to the stairs. Fix any carpet that is loose or worn.  Avoid having throw rugs at the top or bottom of the stairs. If you do have throw rugs, attach them to the floor with carpet tape.  Make sure that you have a light switch at the top of the stairs and the bottom of the stairs. If you do not have them, ask someone to add them for you. What else can I do to help prevent falls?  Wear shoes that:  Do not have high heels.  Have rubber bottoms.  Are comfortable and fit you well.  Are closed at the toe. Do not wear sandals.  If you use a stepladder:  Make sure that it is fully opened. Do not climb a closed stepladder.  Make sure that both sides of the stepladder are locked into place.  Ask  someone to hold it for you, if possible.  Clearly mark and make sure that you can see:  Any grab bars or handrails.  First and last steps.  Where the edge of each step is.  Use tools that help you move around (mobility aids) if they are needed. These include:  Canes.  Walkers.  Scooters.  Crutches.  Turn on the lights when you go into a dark area. Replace any light bulbs as soon as they burn out.  Set up your furniture so you have a clear path. Avoid moving your furniture around.  If any of your floors are uneven, fix them.  If there are any pets around you, be aware of where they are.  Review your medicines with your doctor. Some medicines can make you feel dizzy. This can increase your chance of falling. Ask your doctor what other things that you can do to help prevent falls. This information is not intended to replace advice given to you by your health care provider. Make sure you discuss any questions you have with your health care provider. Document Released: 06/02/2009 Document Revised: 01/12/2016 Document Reviewed: 09/10/2014 Elsevier Interactive Patient Education  2017 [employer].

## 2021-01-16 ENCOUNTER — Other Ambulatory Visit: Payer: Self-pay | Admitting: Family Medicine

## 2021-01-16 DIAGNOSIS — N390 Urinary tract infection, site not specified: Secondary | ICD-10-CM

## 2021-01-18 DIAGNOSIS — E860 Dehydration: Secondary | ICD-10-CM | POA: Diagnosis not present

## 2021-01-18 DIAGNOSIS — E1169 Type 2 diabetes mellitus with other specified complication: Secondary | ICD-10-CM | POA: Diagnosis not present

## 2021-01-18 DIAGNOSIS — N184 Chronic kidney disease, stage 4 (severe): Secondary | ICD-10-CM | POA: Diagnosis not present

## 2021-01-18 DIAGNOSIS — I129 Hypertensive chronic kidney disease with stage 1 through stage 4 chronic kidney disease, or unspecified chronic kidney disease: Secondary | ICD-10-CM | POA: Diagnosis not present

## 2021-01-18 DIAGNOSIS — E11319 Type 2 diabetes mellitus with unspecified diabetic retinopathy without macular edema: Secondary | ICD-10-CM | POA: Diagnosis not present

## 2021-01-18 DIAGNOSIS — R69 Illness, unspecified: Secondary | ICD-10-CM | POA: Diagnosis not present

## 2021-01-18 DIAGNOSIS — E1122 Type 2 diabetes mellitus with diabetic chronic kidney disease: Secondary | ICD-10-CM | POA: Diagnosis not present

## 2021-01-18 DIAGNOSIS — I700 Atherosclerosis of aorta: Secondary | ICD-10-CM | POA: Diagnosis not present

## 2021-01-18 DIAGNOSIS — E785 Hyperlipidemia, unspecified: Secondary | ICD-10-CM | POA: Diagnosis not present

## 2021-01-20 DIAGNOSIS — E11319 Type 2 diabetes mellitus with unspecified diabetic retinopathy without macular edema: Secondary | ICD-10-CM | POA: Diagnosis not present

## 2021-01-20 DIAGNOSIS — R69 Illness, unspecified: Secondary | ICD-10-CM | POA: Diagnosis not present

## 2021-01-20 DIAGNOSIS — E785 Hyperlipidemia, unspecified: Secondary | ICD-10-CM | POA: Diagnosis not present

## 2021-01-20 DIAGNOSIS — E860 Dehydration: Secondary | ICD-10-CM | POA: Diagnosis not present

## 2021-01-20 DIAGNOSIS — I700 Atherosclerosis of aorta: Secondary | ICD-10-CM | POA: Diagnosis not present

## 2021-01-20 DIAGNOSIS — E1122 Type 2 diabetes mellitus with diabetic chronic kidney disease: Secondary | ICD-10-CM | POA: Diagnosis not present

## 2021-01-20 DIAGNOSIS — N184 Chronic kidney disease, stage 4 (severe): Secondary | ICD-10-CM | POA: Diagnosis not present

## 2021-01-20 DIAGNOSIS — E1169 Type 2 diabetes mellitus with other specified complication: Secondary | ICD-10-CM | POA: Diagnosis not present

## 2021-01-20 DIAGNOSIS — I129 Hypertensive chronic kidney disease with stage 1 through stage 4 chronic kidney disease, or unspecified chronic kidney disease: Secondary | ICD-10-CM | POA: Diagnosis not present

## 2021-01-23 ENCOUNTER — Other Ambulatory Visit: Payer: Self-pay | Admitting: Family Medicine

## 2021-01-23 DIAGNOSIS — E1122 Type 2 diabetes mellitus with diabetic chronic kidney disease: Secondary | ICD-10-CM | POA: Diagnosis not present

## 2021-01-23 DIAGNOSIS — E785 Hyperlipidemia, unspecified: Secondary | ICD-10-CM | POA: Diagnosis not present

## 2021-01-23 DIAGNOSIS — I129 Hypertensive chronic kidney disease with stage 1 through stage 4 chronic kidney disease, or unspecified chronic kidney disease: Secondary | ICD-10-CM | POA: Diagnosis not present

## 2021-01-23 DIAGNOSIS — E860 Dehydration: Secondary | ICD-10-CM | POA: Diagnosis not present

## 2021-01-23 DIAGNOSIS — I700 Atherosclerosis of aorta: Secondary | ICD-10-CM | POA: Diagnosis not present

## 2021-01-23 DIAGNOSIS — N184 Chronic kidney disease, stage 4 (severe): Secondary | ICD-10-CM | POA: Diagnosis not present

## 2021-01-23 DIAGNOSIS — E1169 Type 2 diabetes mellitus with other specified complication: Secondary | ICD-10-CM | POA: Diagnosis not present

## 2021-01-23 DIAGNOSIS — R69 Illness, unspecified: Secondary | ICD-10-CM | POA: Diagnosis not present

## 2021-01-23 DIAGNOSIS — E11319 Type 2 diabetes mellitus with unspecified diabetic retinopathy without macular edema: Secondary | ICD-10-CM | POA: Diagnosis not present

## 2021-01-30 DIAGNOSIS — E1169 Type 2 diabetes mellitus with other specified complication: Secondary | ICD-10-CM | POA: Diagnosis not present

## 2021-01-30 DIAGNOSIS — E785 Hyperlipidemia, unspecified: Secondary | ICD-10-CM | POA: Diagnosis not present

## 2021-01-30 DIAGNOSIS — I700 Atherosclerosis of aorta: Secondary | ICD-10-CM | POA: Diagnosis not present

## 2021-01-30 DIAGNOSIS — E860 Dehydration: Secondary | ICD-10-CM | POA: Diagnosis not present

## 2021-01-30 DIAGNOSIS — N184 Chronic kidney disease, stage 4 (severe): Secondary | ICD-10-CM | POA: Diagnosis not present

## 2021-01-30 DIAGNOSIS — R69 Illness, unspecified: Secondary | ICD-10-CM | POA: Diagnosis not present

## 2021-01-30 DIAGNOSIS — E11319 Type 2 diabetes mellitus with unspecified diabetic retinopathy without macular edema: Secondary | ICD-10-CM | POA: Diagnosis not present

## 2021-01-30 DIAGNOSIS — I129 Hypertensive chronic kidney disease with stage 1 through stage 4 chronic kidney disease, or unspecified chronic kidney disease: Secondary | ICD-10-CM | POA: Diagnosis not present

## 2021-01-30 DIAGNOSIS — E1122 Type 2 diabetes mellitus with diabetic chronic kidney disease: Secondary | ICD-10-CM | POA: Diagnosis not present

## 2021-02-13 ENCOUNTER — Other Ambulatory Visit: Payer: Self-pay

## 2021-02-13 ENCOUNTER — Encounter (INDEPENDENT_AMBULATORY_CARE_PROVIDER_SITE_OTHER): Payer: Medicare HMO | Admitting: Ophthalmology

## 2021-02-13 DIAGNOSIS — I10 Essential (primary) hypertension: Secondary | ICD-10-CM

## 2021-02-13 DIAGNOSIS — E113291 Type 2 diabetes mellitus with mild nonproliferative diabetic retinopathy without macular edema, right eye: Secondary | ICD-10-CM | POA: Diagnosis not present

## 2021-02-13 DIAGNOSIS — H43812 Vitreous degeneration, left eye: Secondary | ICD-10-CM | POA: Diagnosis not present

## 2021-02-13 DIAGNOSIS — E113392 Type 2 diabetes mellitus with moderate nonproliferative diabetic retinopathy without macular edema, left eye: Secondary | ICD-10-CM | POA: Diagnosis not present

## 2021-02-13 DIAGNOSIS — H35033 Hypertensive retinopathy, bilateral: Secondary | ICD-10-CM | POA: Diagnosis not present

## 2021-02-13 DIAGNOSIS — H338 Other retinal detachments: Secondary | ICD-10-CM

## 2021-02-13 DIAGNOSIS — H35371 Puckering of macula, right eye: Secondary | ICD-10-CM

## 2021-02-13 DIAGNOSIS — H353132 Nonexudative age-related macular degeneration, bilateral, intermediate dry stage: Secondary | ICD-10-CM | POA: Diagnosis not present

## 2021-02-17 ENCOUNTER — Ambulatory Visit (HOSPITAL_COMMUNITY)
Admission: EM | Admit: 2021-02-17 | Discharge: 2021-02-17 | Disposition: A | Discharge status: Home / Self Care | Payer: Medicare HMO | Attending: Family Medicine | Admitting: Family Medicine

## 2021-02-17 ENCOUNTER — Encounter (HOSPITAL_COMMUNITY): Payer: Self-pay | Admitting: Emergency Medicine

## 2021-02-17 ENCOUNTER — Other Ambulatory Visit: Payer: Self-pay

## 2021-02-17 DIAGNOSIS — R739 Hyperglycemia, unspecified: Secondary | ICD-10-CM | POA: Diagnosis not present

## 2021-02-17 DIAGNOSIS — R531 Weakness: Secondary | ICD-10-CM

## 2021-02-17 DIAGNOSIS — E1165 Type 2 diabetes mellitus with hyperglycemia: Secondary | ICD-10-CM

## 2021-02-17 LAB — COMPREHENSIVE METABOLIC PANEL
ALT: 15 U/L (ref 0–44)
AST: 15 U/L (ref 15–41)
Albumin: 3.6 g/dL (ref 3.5–5.0)
Alkaline Phosphatase: 63 U/L (ref 38–126)
Anion gap: 8 (ref 5–15)
BUN: 24 mg/dL — ABNORMAL HIGH (ref 8–23)
CO2: 27 mmol/L (ref 22–32)
Calcium: 9.2 mg/dL (ref 8.9–10.3)
Chloride: 100 mmol/L (ref 98–111)
Creatinine, Ser: 1.39 mg/dL — ABNORMAL HIGH (ref 0.44–1.00)
GFR, Estimated: 36 mL/min — ABNORMAL LOW (ref >60)
Glucose, Bld: 292 mg/dL — ABNORMAL HIGH (ref 70–99)
Potassium: 4.5 mmol/L (ref 3.5–5.1)
Sodium: 135 mmol/L (ref 135–145)
Total Bilirubin: 0.8 mg/dL (ref 0.3–1.2)
Total Protein: 5.5 g/dL — ABNORMAL LOW (ref 6.5–8.1)

## 2021-02-17 LAB — CBC WITH DIFFERENTIAL/PLATELET
Abs Immature Granulocytes: 0.03 10*3/uL (ref 0.00–0.07)
Basophils Absolute: 0.1 10*3/uL (ref 0.0–0.1)
Basophils Relative: 1 %
Eosinophils Absolute: 0.2 10*3/uL (ref 0.0–0.5)
Eosinophils Relative: 3 %
HCT: 37.8 % (ref 36.0–46.0)
Hemoglobin: 12.1 g/dL (ref 12.0–15.0)
Immature Granulocytes: 1 %
Lymphocytes Relative: 16 %
Lymphs Abs: 0.9 10*3/uL (ref 0.7–4.0)
MCH: 28.3 pg (ref 26.0–34.0)
MCHC: 32 g/dL (ref 30.0–36.0)
MCV: 88.3 fL (ref 80.0–100.0)
Monocytes Absolute: 0.4 10*3/uL (ref 0.1–1.0)
Monocytes Relative: 7 %
Neutro Abs: 4.2 10*3/uL (ref 1.7–7.7)
Neutrophils Relative %: 72 %
Platelets: 191 10*3/uL (ref 150–400)
RBC: 4.28 MIL/uL (ref 3.87–5.11)
RDW: 13.3 % (ref 11.5–15.5)
WBC: 5.7 10*3/uL (ref 4.0–10.5)
nRBC: 0 % (ref 0.0–0.2)

## 2021-02-17 LAB — POCT URINALYSIS DIPSTICK, ED / UC
Bilirubin Urine: NEGATIVE
Glucose, UA: NEGATIVE mg/dL
Hgb urine dipstick: NEGATIVE
Leukocytes,Ua: NEGATIVE
Nitrite: NEGATIVE
Protein, ur: NEGATIVE mg/dL
Specific Gravity, Urine: 1.025 (ref 1.005–1.030)
Urobilinogen, UA: 2 mg/dL — ABNORMAL HIGH (ref 0.0–1.0)
pH: 5 (ref 5.0–8.0)

## 2021-02-17 LAB — CBG MONITORING, ED: Glucose-Capillary: 277 mg/dL — ABNORMAL HIGH (ref 70–99)

## 2021-02-17 NOTE — Discharge Instructions (Addendum)
Go to the emergency department if your symptoms worsen at any time.  We will be in touch with your blood results hopefully in the next 24 hours.  Follow-up with your primary care provider first thing next week.

## 2021-02-17 NOTE — ED Triage Notes (Signed)
Came to be checked for UTI. Denies frequency, urgency, odor.   Reports nausea. Notes that her glucose was 250 this AM

## 2021-02-17 NOTE — ED Provider Notes (Signed)
[facility]    CSN: [id number] Arrival date & time: 02/17/21  1120      History   Chief Complaint Chief Complaint  Patient presents with   Nausea    HPI Renee [name] is a 87 y.o. female.   Patient brought in today with a caregiver who helps provide history the patient gives most herself.  She states that she feels more weak than usual this morning and wanted to be checked for a urinary tract infection.  Her blood sugar was 250 this morning when she checked, states her baseline is usually around 200.  Has been taking her medications as usual and has not had any recent diet changes.  She also notes a dizzy spell this morning when she sat up in bed but quickly resolved and has not returned.  Denies chest pain, fever, chills, abdominal pain, vomiting, diarrhea, altered mental status, falls, recent illness or sick contacts, cough, shortness of breath.  Caregiver does endorse that she was nauseated this morning but she ate her breakfast as usual and kept it down without issue.  Has not tried anything for symptoms.   Past Medical History:  Diagnosis Date   Angioedema    possibly from voltaren   Bronchopneumonia 12/11/2016   Degenerative disc disease    Diabetes mellitus    type II   Hyperlipidemia    Hypertension    LVH (left ventricular hypertrophy)    and atrial enlargement by echo in past with nl EF   Nasal pruritis    Osteoarthritis    Osteopenia    Renal insufficiency    Sleep apnea    Stroke ([place]) 05/2010   Small vessel sobcortical (in [place]) with Dr Leonie Man, residual L hemiparesis   Vitamin B 12 deficiency 04/08    Patient Active Problem List   Diagnosis Date Noted   Pneumothorax 08/09/2020   Pleural effusion 05/13/2020   Frequent UTI 05/13/2020   Dizziness 02/12/2020   Normocytic anemia 02/12/2020   Fall involving sidewalk curb 11/29/2019   Contusion of back 11/27/2019   Hyperlipidemia associated with type 2 diabetes mellitus ([place])    Aortic  atherosclerosis ([place]) 07/13/2019   H/O sepsis 07/01/2019   CKD (chronic kidney disease) stage 4, GFR 15-29 ml/min ([place]) 07/01/2019   Lower abdominal pain 05/11/2019   Routine general medical examination at a health care facility 03/30/2019   External hemorrhoid 01/17/2018   History of colitis 01/17/2018   Generalized weakness 12/24/2016   Diabetic retinopathy ([place]) 12/11/2016   History of CVA (cerebrovascular accident) 12/11/2016   Community acquired pneumonia of left lower lobe of lung 10/07/2015   Estrogen deficiency 08/30/2015   Encounter for Medicare annual wellness exam 04/17/2013   Chest wall pain 04/07/2013   Mobility impaired 06/26/2011   History of retinal detachment 01/10/2011   Sleep apnea 11/28/2010   Anxiety and depression 08/25/2010   Hemiplegia, late effect of cerebrovascular disease ([place]) 07/05/2010   POSTHERPETIC NEURALGIA 11/09/2009   Renal insufficiency 06/29/2008   Chronic back pain 01/26/2008   EDEMA 01/26/2008   B12 deficiency 01/10/2007   Type 2 diabetes, controlled, with retinopathy ([place]) 11/27/2006   Essential hypertension 11/27/2006   FIBROCYSTIC BREAST DISEASE 11/27/2006   ROSACEA 11/27/2006   OSTEOARTHRITIS 11/27/2006   URINARY INCONTINENCE, MIXED 11/27/2006    Past Surgical History:  Procedure Laterality Date   ABDOMINAL HYSTERECTOMY     BSO-fibroids   APPENDECTOMY     BACK SURGERY     COLON SURGERY  due to punctured intestines   EYE SURGERY     cataract extraction   IR THORACENTESIS ASP PLEURAL SPACE W/IMG GUIDE  06/07/2020   KNEE SURGERY     arthroscope   PARS PLANA VITRECTOMY  07/31/2011   Procedure: PARS PLANA VITRECTOMY WITH 25 GAUGE;  Surgeon: [name], MD;  Location: [facility];  Service: Ophthalmology;  Laterality: Right;  REMOVAL OF SILICONE OIL AND LASER RIGHT EYE   RETINAL DETACHMENT SURGERY  02/18/11   times 2   SPINE SURGERY  08/09   spinal decompression surgery    OB History   No obstetric history on file.       Home Medications    Prior to Admission medications   Medication Sig Start Date End Date Taking? Authorizing Provider  albuterol (VENTOLIN HFA) 108 (90 Base) MCG/ACT inhaler INHALE 2 PUFFS INTO THE LUNGS EVERY 4 HOURS AS NEEDED FOR WHEEZING/SHORTNESS OF BREATH 10/21/20   Tower, Wynelle Fanny, MD  ALPRAZolam [name]) 0.5 MG tablet Take 0.5-1 tablets (0.25-0.5 mg total) by mouth 2 (two) times daily as needed for anxiety. 11/28/20   [name], MD  amLODipine (NORVASC) 10 MG tablet Take 1 tablet (10 mg total) by mouth daily. 09/28/20   [name], MD  Blood Glucose Monitoring Suppl (ONE TOUCH ULTRA 2) w/Device KIT Check blood sugar once daily and as directed. Dx E11.9 08/27/18   Tower, Wynelle Fanny, MD  calcium-vitamin D (OSCAL WITH D) 500-200 MG-UNIT per tablet Take 1 tablet by mouth daily.    [provider]  cephALEXin (KEFLEX) 500 MG capsule TAKE 1 CAPSULE (500 MG TOTAL) BY MOUTH DAILY. 01/17/21   [name], MD  Cholecalciferol (VITAMIN D) 1000 UNITS capsule Take 1,000 Units by mouth daily.    [provider]  Cranberry 500 MG TABS Take 500 mg by mouth in the morning and at bedtime.    [provider]  docusate sodium (COLACE) 100 MG capsule Take 200 mg by mouth at bedtime.    [provider]  glipiZIDE (GLIPIZIDE XL) 5 MG 24 hr tablet Take 1 tablet (5 mg total) by mouth daily with breakfast. 10/18/20   [name], MD  hydrALAZINE (APRESOLINE) 100 MG tablet Take 1 tablet (100 mg total) by mouth 3 (three) times daily. 09/28/20   [name], MD  Lancets (ONETOUCH DELICA PLUS AJOINO67E) MISC CHECK BLOOD SUGAR ONCE DAILY AND AS DIRECTED. DX E11.9 08/24/20   Tower, Wynelle Fanny, MD  metoprolol succinate (TOPROL-XL) 25 MG 24 hr tablet Take 0.5 tablets (12.5 mg total) by mouth 2 (two) times daily. 09/28/20   [name], MD  mirtazapine (REMERON) 15 MG tablet TAKE 1/2 TABLET BY MOUTH AT BEDTIME 08/01/20   Tower, Wynelle Fanny, MD  Multiple Vitamin  (MULTIVITAMIN) capsule Take 1 capsule by mouth daily.    [provider]  Multiple Vitamins-Minerals (PRESERVISION AREDS 2+MULTI VIT) CAPS Take 1 capsule by mouth in the morning and at bedtime.    [provider]  Olopatadine HCl (PATADAY OP) Place 1 drop into both eyes daily.    [provider]  ONETOUCH ULTRA test strip CHECK BLOOD SUGAR ONCE DAILY AND AS DIRECTED. DX E11.9 12/27/20   [name], MD  sertraline (ZOLOFT) 50 MG tablet Take 1 tablet (50 mg total) by mouth daily. 09/28/20   [name], MD  sitaGLIPtin (JANUVIA) 100 MG tablet Take 1 tablet (100 mg total) by mouth daily. 09/28/20   [name],  MD  trandolapril (MAVIK) 4 MG tablet Take 1 tablet (4 mg total) by mouth daily. 09/28/20   [name], MD  triamcinolone (NASACORT) 55 MCG/ACT AERO nasal inhaler Place 2 sprays into the nose daily. Patient taking differently: Place 2 sprays into the nose at bedtime. 03/28/20   [name], MD  vitamin B-12 (CYANOCOBALAMIN) 1000 MCG tablet Take 1,000 mcg by mouth daily.     [provider]    Family History Family History  Problem Relation Age of Onset   COPD Brother    Cancer Sister        brain tumor with hemmorhage   Heart disease Sister        CAD    Social History Social History   Tobacco Use   Smoking status: Never   Smokeless tobacco: Never  Substance Use Topics   Alcohol use: No    Alcohol/week: 0.0 standard drinks   Drug use: No     Allergies   Diclofenac sodium and Pioglitazone   Review of Systems Review of Systems Per HPI  Physical Exam Triage Vital Signs ED Triage Vitals  Enc Vitals Group     BP 02/17/21 1206 (!) 158/63     Pulse Rate 02/17/21 1206 (!) 56     Resp 02/17/21 1206 16     Temp 02/17/21 1206 98.1 F (36.7 C)     Temp Source 02/17/21 1206 Oral     SpO2 02/17/21 1206 96 %     Weight --      Height --      Head Circumference --      Peak Flow --      Pain Score 02/17/21  1205 0     Pain Loc --      Pain Edu? --      Excl. in [place]? --    No data found.  Updated Vital Signs BP (!) 158/63   Pulse (!) 56   Temp 98.1 F (36.7 C) (Oral)   Resp 16   SpO2 96%   Visual Acuity Right Eye Distance:   Left Eye Distance:   Bilateral Distance:    Right Eye Near:   Left Eye Near:    Bilateral Near:     Physical Exam Vitals and nursing note reviewed.  Constitutional:      Appearance: Normal appearance. She is not ill-appearing.  HENT:     Head: Atraumatic.  Eyes:     Extraocular Movements: Extraocular movements intact.     Conjunctiva/sclera: Conjunctivae normal.  Cardiovascular:     Rate and Rhythm: Normal rate.     Heart sounds: Normal heart sounds.  Pulmonary:     Effort: Pulmonary effort is normal. No respiratory distress.     Breath sounds: Normal breath sounds. No wheezing or rales.  Abdominal:     General: Bowel sounds are normal. There is no distension.     Palpations: Abdomen is soft.     Tenderness: There is no abdominal tenderness. There is no right CVA tenderness, left CVA tenderness or guarding.  Genitourinary:    Comments: GU exam deferred Musculoskeletal:     Cervical back: Normal range of motion and neck supple.     Comments: Ambulates with a walker, which is baseline for patient per patient and caregiver.  Gait appears steady  Skin:    General: Skin is warm and dry.  Neurological:     Mental Status: She is alert and oriented to person, place, and time.  Mental status is at baseline.     Comments: Neurologic status at baseline per patient and caregiver  Psychiatric:        Mood and Affect: Mood normal.        Thought Content: Thought content normal.        Judgment: Judgment normal.     UC Treatments / Results  Labs (all labs ordered are listed, but only abnormal results are displayed) Labs Reviewed  POCT URINALYSIS DIPSTICK, ED / UC - Abnormal; Notable for the following components:      Result Value   Ketones, ur TRACE  (*)    Urobilinogen, UA 2.0 (*)    All other components within normal limits  CBG MONITORING, ED - Abnormal; Notable for the following components:   Glucose-Capillary 277 (*)    All other components within normal limits  URINE CULTURE  CBC WITH DIFFERENTIAL/PLATELET  COMPREHENSIVE METABOLIC PANEL    EKG   Radiology No results found.  Procedures Procedures (including critical care time)  Medications Ordered in UC Medications - No data to display  Initial Impression / Assessment and Plan / UC Course  I have reviewed the triage vital signs and the nursing notes.  Pertinent labs & imaging results that were available during my care of the patient were reviewed by me and considered in my medical decision making (see chart for details).     Mildly hypertensive and bradycardic in triage, appears alert and in no acute distress.  No obvious abnormalities on exam.  UA without evidence of urinary tract infection but will send out for urine culture to be certain.  EKG today largely unchanged from previous with no new acute ST elevations or T wave changes.  Glucose significantly elevated at 277.  Labs pending.  Did recommend that she go to the emergency department for further monitoring, work-up as we are unable to get labs back immediately and she is having worsening weakness and elevated blood sugars beyond baseline but she declines and wishes to wait for lab results prior to making any decisions.  States her PCP is on vacation but she will try to get in with them for recheck early next week.  Discussed close monitoring of vital signs and blood sugars at home, going to the emergency department if symptoms worsen at any point.  Final Clinical Impressions(s) / UC Diagnoses   Final diagnoses:  Weakness  Hyperglycemia  Uncontrolled type 2 diabetes mellitus with hyperglycemia [facility])     Discharge Instructions      Go to the emergency department if your symptoms worsen at any time.  We will  be in touch with your blood results hopefully in the next 24 hours.  Follow-up with your primary care provider first thing next week.     ED Prescriptions   None    PDMP not reviewed this encounter.   Volney American, [place] 02/17/21 1427

## 2021-02-18 LAB — URINE CULTURE

## 2021-02-20 ENCOUNTER — Other Ambulatory Visit: Payer: Self-pay | Admitting: Family Medicine

## 2021-02-23 ENCOUNTER — Other Ambulatory Visit: Payer: Self-pay | Admitting: Family Medicine

## 2021-02-28 ENCOUNTER — Ambulatory Visit (INDEPENDENT_AMBULATORY_CARE_PROVIDER_SITE_OTHER): Payer: Medicare HMO | Admitting: Family Medicine

## 2021-02-28 ENCOUNTER — Other Ambulatory Visit: Payer: Self-pay

## 2021-02-28 ENCOUNTER — Encounter: Payer: Self-pay | Admitting: Family Medicine

## 2021-02-28 VITALS — BP 126/64 | HR 71 | Temp 98.9°F | Wt 171.0 lb

## 2021-02-28 DIAGNOSIS — N390 Urinary tract infection, site not specified: Secondary | ICD-10-CM | POA: Diagnosis not present

## 2021-02-28 DIAGNOSIS — E1165 Type 2 diabetes mellitus with hyperglycemia: Secondary | ICD-10-CM | POA: Diagnosis not present

## 2021-02-28 LAB — URINALYSIS, ROUTINE W REFLEX MICROSCOPIC
Bacteria, UA: NONE SEEN /HPF
Bilirubin Urine: NEGATIVE
Glucose, UA: NEGATIVE
Hgb urine dipstick: NEGATIVE
Hyaline Cast: NONE SEEN /LPF
Leukocytes,Ua: NEGATIVE
Nitrite: NEGATIVE
RBC / HPF: NONE SEEN /HPF (ref 0–2)
Specific Gravity, Urine: 1.025 (ref 1.001–1.035)
pH: 5.5 (ref 5.0–8.0)

## 2021-02-28 LAB — MICROSCOPIC MESSAGE

## 2021-02-28 IMAGING — DX DG CHEST 1V PORT
1 series · 1 of 1 positions shown · non-contrast
Comparison: 06/30/2019

CLINICAL DATA: Fever, cough

EXAM:
PORTABLE CHEST 1 VIEW

[chest ap]
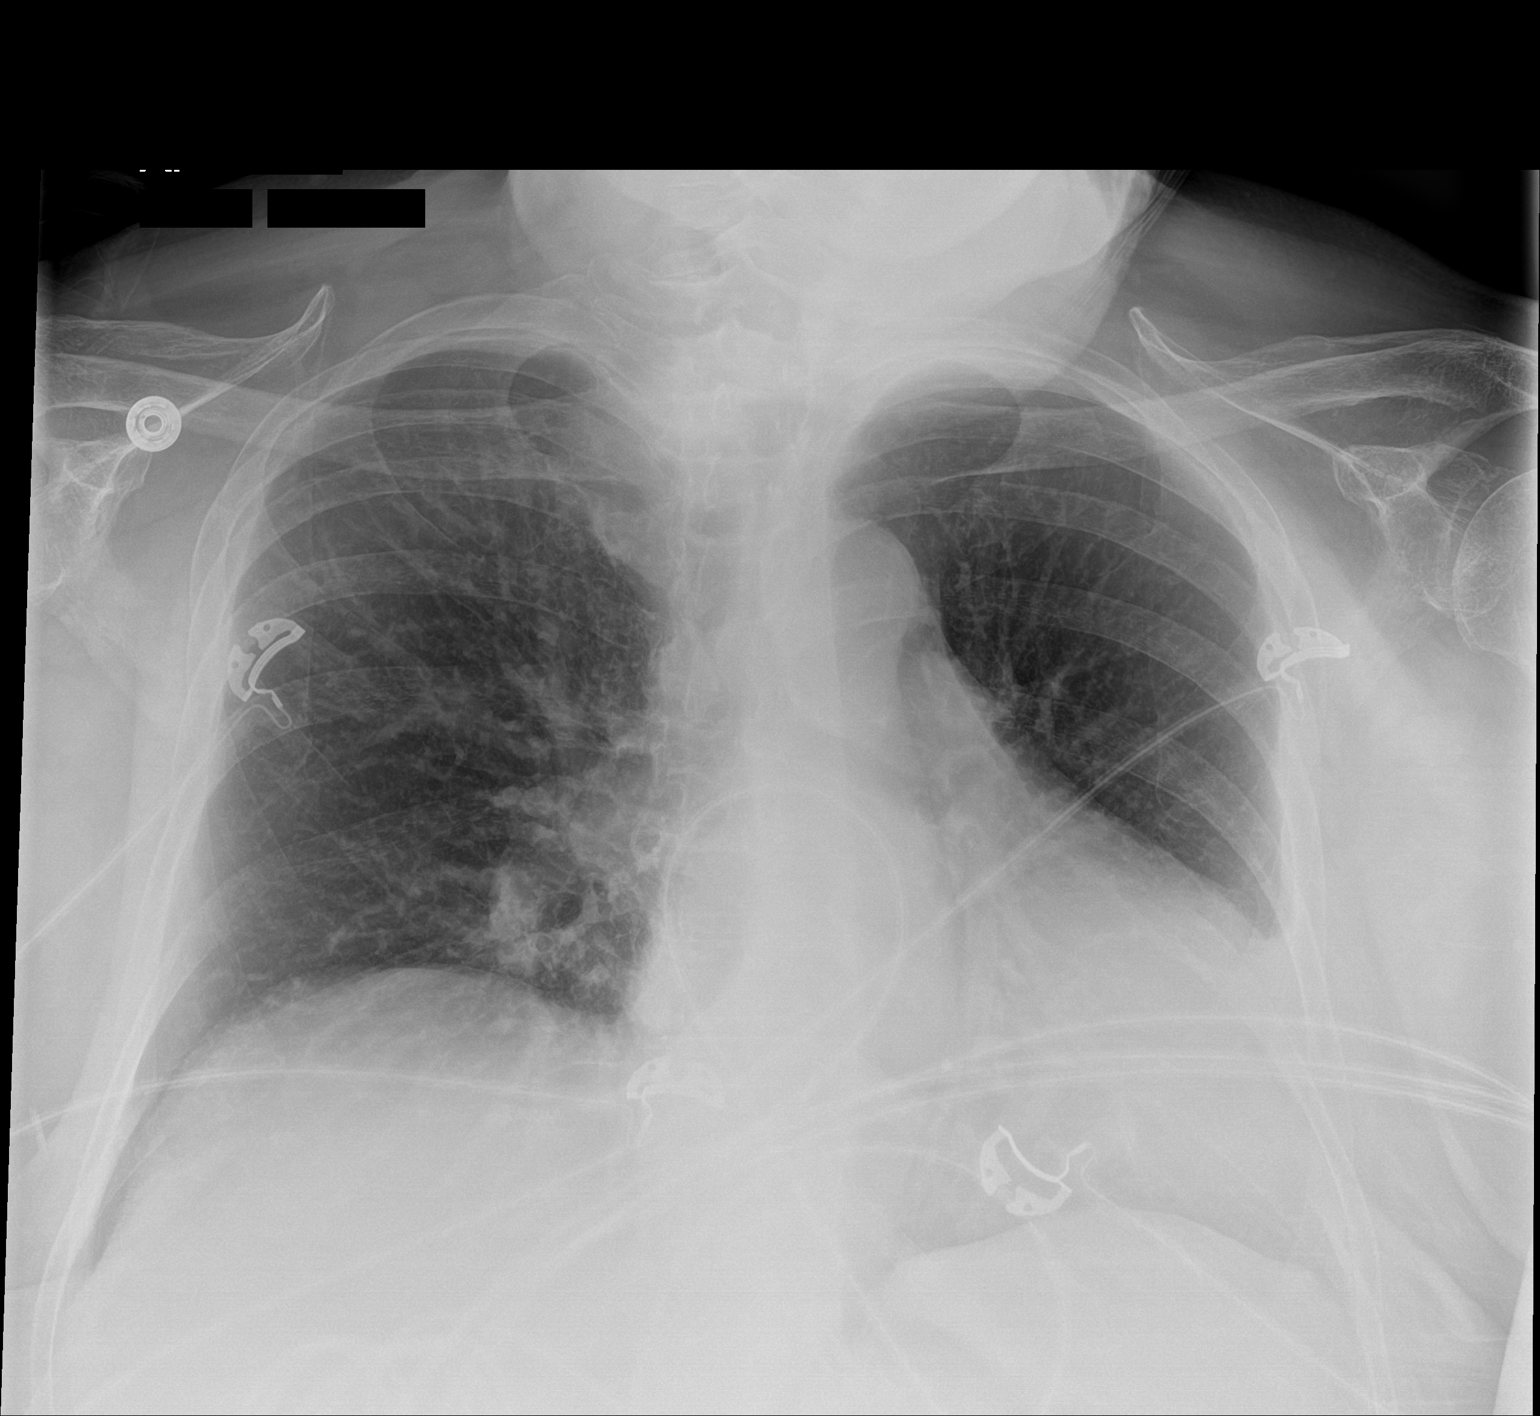

[1 of 1 positions shown; findings below may reference images not displayed]

FINDINGS: Cardiomegaly. Both lungs are clear. The visualized skeletal
structures are unremarkable.
IMPRESSION: Cardiomegaly without acute abnormality of the lungs in AP portable
projection.

## 2021-02-28 MED ORDER — GLIPIZIDE ER 10 MG PO TB24: 10.0000 mg | ORAL_TABLET | Freq: Every day | ORAL | 3 refills | Status: DC

## 2021-02-28 NOTE — Progress Notes (Signed)
Subjective:    Patient ID: Renee [name], female    DOB: [birth year], 87 y.o.   MRN: [id number]  09/26/20 Patient is here today with her sister. Sister is concerned that she has a bladder infection. She reports 2-day history of low back pain. Urinalysis today shows only trace leukocyte esterase but no blood and no nitrates. However she has been hospitalized 2 separate times recently for urinary tract infection and therefore family is very concerned. However on exam today, the patient is exquisitely tender to palpation over the iliac crest on the left-hand side and the right-hand side. It is definitely a skeletal pain that the patient is experiencing. She states that the pain is worse when she turns over in bed or tries to change positions in a chair. She denies any falls. However she I am able to reproduce the pain exactly by pressing on the iliac crest on both sides. She denies any pain in the hip joint and she is able to bear weight on her legs without difficulty or pain in the hip. Family has discussed with the patient and they would like to start a daily antibiotic to prevent urinary tract infections given how sick she is gotten recently with him.  At that time, my plan was: I believe the pelvic pain likely represents an injury to the pelvic bone itself. Patient tends to fall down in her chair when she sits down and I am curious if she may have cracked part of the pelvis. Therefore I want to get an x-ray immediately. Examination of the hip is relatively normal. The pain does not appear to be coming from a bladder infection. She does have some trace leukocyte esterase we could simply just be contaminant. However the patient wants to aggressively treat this due to her recent history and therefore I will treat with Bactrim double strength tablets twice daily for 3 days. They have also decided to start taking a daily antibiotic Keflex 500 mg a day for UTI prophylaxis. We discussed the risks and benefits of  this including resistant infections, yeast infections etc. and the patient is willing to accept that risk. I will schedule the patient for home health physical therapy as she has shown progressive weakness and deconditioning and the family is requesting that we order this  10/18/20 Urine culture on 2/7 grew Klebsiella that was resistant to Keflex but sensitive to Bactrim.  Therefore the Bactrim should have treated the initial infection.  She denies any dysuria or urgency or frequency.  The back pain that she was experiencing at her last visit has resolved.  She is checking her blood sugar.  She is currently on glipizide extended release 5 mg a day and Januvia 100 mg a day.  Her fasting blood sugars are averaging between 140 and 170 every morning.  She is not checking any 2-hour postprandial sugars.  She denies any sugar reading above 200 in the last several weeks.  She denies any polyuria polydipsia or blurry vision.  She denies any chest pain or shortness of breath or dyspnea on exertion.  Her hemoglobin A1c at her last office visit was 7.9.  At that time, my plan was: Overall the patient seems to be doing much better.  We will continue Keflex as a preventative medication however I will check a urine culture today.  Her last infection was Klebsiella resistant to Keflex.  If the patient continues to have a bacteria growing in her urine, we may need to switch to  a preventative medication to which it is sensitive.  Therefore I would like to see culture and sensitivities when she is asymptomatic.  Regarding her diabetes, given her advanced age, I would like to keep her blood sugar between 100-200 without having hypoglycemic episodes.  It seems that the patient is there.  Therefore I would recommend rechecking a hemoglobin A1c in 3 months however overall she seems to be doing better.  12/07/20 Urine culture 3/1 was clear of infection.  Over the last week, the patient has felt lightheaded.  She describes it as being  swimmy headed.  She feels weak.  She is also been having night sweats and hot flashes at night.  She was concerned that she may have a urinary tract infection.  Urinalysis today shows no blood, no nitrates, and no leukocyte esterase.  Therefore she does not appear to have a urinary tract infection although I will send a urine culture.  She denies any cough or chest pain or shortness of breath.  She did have an episode of diarrhea on Monday but otherwise she denies any abdominal pain nausea vomiting diarrhea.  She denies any rhinorrhea, throat sore throat, sinus pain, or headaches.  She denies any body aches or rashes.  On physical exam today, the patient does look somewhat dehydrated.  Her mucous membranes are dry.  There is no swelling in her legs.  Her lungs are clear.  She is taking Lasix 20 mg daily.  At that time, my plan was: Patient clinically looks dehydrated.  I have asked her to hold the Lasix for the next 2 or 3 days and try to drink 1 or 2 glasses of water extra to improve her hydration.  Check a CMP and a CBC.  I will send a urine culture however her initial urinalysis is normal.  Reassess in 2 to 3 days to see how she is feeling.  I hope that as her hydration improves, the dizziness and lightheadedness will improve.  Today her mental status appears to be near her baseline.  I will consult home health physical therapy to improve her strength as she would benefit from physical therapy   12/19/20 Urine Culture grew citrobacter freundi and I started her on 5 days of bactrim.  She continues to report night sweats.  She states that they wake her up at night and keeps her from resting well.  She denies any dysuria or urgency.  She denies any abdominal pain.  She denies any chest pain or shortness of breath however she has started back on her Lasix.  She does not appear fluid overloaded today.  If anything she appears dehydrated.  Her caretaker also states that her blood pressure has been as low as 637  systolic at home.  Therefore I feel that she may be dehydrated and that she is taking too much medication.  I do not think that the night sweats are due to hypoglycemia.  She states that her blood sugars are typically between 150 and 190.  She does continue to report vertigo.  She states that whenever she rolls over in bed, it will feel like the room is spinning for a few seconds and then it subsides.  It happens whenever she lays down or rolls over in bed.  This certainly seems like BPPV.  She has an appointment later this week to see the ENT doctor.  At that time, my plan was:  Repeat urinalysis and a urine culture to ensure resolution of UTI.  Due to lower blood pressures at home, I want to discontinue furosemide as I feel that she is on too much medication and may even be dehydrated causing her blood pressure does drop.  I believe the dizziness is actually vertigo due to BPPV.  I do not feel medication would be beneficial given the short amount of time she has symptoms of vertigo.  I would consider Epley maneuvers howeve she has an appointment to see ENT tomorrow and so I will await their recommendations.  Check hemoglobin A1c to monitor management of diabetes.  I will be happy with an A1c less than 7.5 for this individual given her frail health.  Recommended that she use a box fan at night on her bedside table to help calm down some of the night sweats and help with her insomnia due to the white noise.  02/28/21 Patient is a very pleasant 87 year old Caucasian female who was seen in urgent care on July 1 while I was out of town on vacation.  Apparently she was having severe vertigo.  She went to the urgent care where they obtain some baseline lab work and also evaluated her for urinary tract infection.  Urinalysis there was unremarkable.  Urine culture grew contamination but no evidence of urinary tract infection.  Lab work did show chronic kidney disease and also an elevated blood sugar of over 290.  She  is here today with her caregiver.  Her blood sugars at home have been greater than 200 on a consistent basis.  Due to her chronic kidney disease she is not a good candidate for metformin.  She has a previous history of an allergic reaction to Actos due to swelling.  She is on glipizide extended release 5 mg a day and Januvia 100 mg a day.  Januvia is extremely expensive and she wants to try to avoid any additional medications that her name brand.  She states that she still having vertigo.  She states in the morning whenever she wakes up to get out of bed, the room will spin.  This last a minute or 2 and then resolve spontaneously.  She then feels normal the rest of the day.  Her symptoms sound classic for BPPV.  She denies any fevers or chills or headaches.  She denies any confusion.  She denies any chest pain or shortness of breath or dyspnea on exertion.  Her blood pressure today is excellent. Past Medical History:  Diagnosis Date   Angioedema    possibly from voltaren   Bronchopneumonia 12/11/2016   Degenerative disc disease    Diabetes mellitus    type II   Hyperlipidemia    Hypertension    LVH (left ventricular hypertrophy)    and atrial enlargement by echo in past with nl EF   Nasal pruritis    Osteoarthritis    Osteopenia    Renal insufficiency    Sleep apnea    Stroke ([place]) 05/2010   Small vessel sobcortical (in [place]) with Dr Leonie Man, residual L hemiparesis   Vitamin B 12 deficiency 04/08   Past Surgical History:  Procedure Laterality Date   ABDOMINAL HYSTERECTOMY     BSO-fibroids   APPENDECTOMY     BACK SURGERY     COLON SURGERY     due to punctured intestines   EYE SURGERY     cataract extraction   IR THORACENTESIS ASP PLEURAL SPACE W/IMG GUIDE  06/07/2020   KNEE SURGERY     arthroscope   PARS  PLANA VITRECTOMY  07/31/2011   Procedure: PARS PLANA VITRECTOMY WITH 25 GAUGE;  Surgeon: [name], MD;  Location: [facility];  Service: Ophthalmology;  Laterality: Right;   REMOVAL OF SILICONE OIL AND LASER RIGHT EYE   RETINAL DETACHMENT SURGERY  02/18/11   times 2   SPINE SURGERY  08/09   spinal decompression surgery   Current Outpatient Medications on File Prior to Visit  Medication Sig Dispense Refill   albuterol (VENTOLIN HFA) 108 (90 Base) MCG/ACT inhaler INHALE 2 PUFFS INTO THE LUNGS EVERY 4 HOURS AS NEEDED FOR WHEEZING/SHORTNESS OF BREATH 18 each 3   ALPRAZolam (XANAX) 0.5 MG tablet TAKE 0.5-1 TABLETS (0.25-0.5 MG TOTAL) BY MOUTH 2 (TWO) TIMES DAILY AS NEEDED FOR ANXIETY. 60 tablet 2   amLODipine (NORVASC) 10 MG tablet Take 1 tablet (10 mg total) by mouth daily. 90 tablet 3   Blood Glucose Monitoring Suppl (ONE TOUCH ULTRA 2) w/Device KIT Check blood sugar once daily and as directed. Dx E11.9 1 each 0   calcium-vitamin D (OSCAL WITH D) 500-200 MG-UNIT per tablet Take 1 tablet by mouth daily.     cephALEXin (KEFLEX) 500 MG capsule TAKE 1 CAPSULE (500 MG TOTAL) BY MOUTH DAILY. 30 capsule 3   Cholecalciferol (VITAMIN D) 1000 UNITS capsule Take 1,000 Units by mouth daily.     Cranberry 500 MG TABS Take 500 mg by mouth in the morning and at bedtime.     docusate sodium (COLACE) 100 MG capsule Take 200 mg by mouth at bedtime.     glipiZIDE (GLIPIZIDE XL) 5 MG 24 hr tablet Take 1 tablet (5 mg total) by mouth daily with breakfast. 90 tablet 3   hydrALAZINE (APRESOLINE) 100 MG tablet Take 1 tablet (100 mg total) by mouth 3 (three) times daily. 270 tablet 3   Lancets (ONETOUCH DELICA PLUS IWLNLG92J) MISC CHECK BLOOD SUGAR ONCE DAILY AND AS DIRECTED. DX E11.9 100 each 2   metoprolol succinate (TOPROL-XL) 25 MG 24 hr tablet Take 0.5 tablets (12.5 mg total) by mouth 2 (two) times daily. 90 tablet 3   mirtazapine (REMERON) 15 MG tablet TAKE 1/2 TABLET BY MOUTH AT BEDTIME 45 tablet 3   Multiple Vitamin (MULTIVITAMIN) capsule Take 1 capsule by mouth daily.     Multiple Vitamins-Minerals (PRESERVISION AREDS 2+MULTI VIT) CAPS Take 1 capsule by mouth in the morning and at  bedtime.     Olopatadine HCl (PATADAY OP) Place 1 drop into both eyes daily.     ONETOUCH ULTRA test strip CHECK BLOOD SUGAR ONCE DAILY AND AS DIRECTED. DX E11.9 100 strip 1   sertraline (ZOLOFT) 50 MG tablet Take 1 tablet (50 mg total) by mouth daily. 90 tablet 3   sitaGLIPtin (JANUVIA) 100 MG tablet Take 1 tablet (100 mg total) by mouth daily. 90 tablet 3   trandolapril (MAVIK) 4 MG tablet Take 1 tablet (4 mg total) by mouth daily. 90 tablet 3   triamcinolone (NASACORT) 55 MCG/ACT AERO nasal inhaler Place 2 sprays into the nose daily. (Patient taking differently: Place 2 sprays into the nose at bedtime.) 1 each 5   vitamin B-12 (CYANOCOBALAMIN) 1000 MCG tablet Take 1,000 mcg by mouth daily.      No current facility-administered medications on file prior to visit.   Allergies  Allergen Reactions   Diclofenac Sodium Other (See Comments)    REACTION: angioedema   Pioglitazone Swelling   Social History   Socioeconomic History   Marital status: Widowed    Spouse name: Not  on file   Number of children: Not on file   Years of education: Not on file   Highest education level: Not on file  Occupational History   Not on file  Tobacco Use   Smoking status: Never   Smokeless tobacco: Never  Vaping Use   Vaping Use: Not on file  Substance and Sexual Activity   Alcohol use: No    Alcohol/week: 0.0 standard drinks   Drug use: No   Sexual activity: Never  Other Topics Concern   Not on file  Social History Narrative   Not on file   Social Determinants of Health   Financial Resource Strain: Low Risk    Difficulty of Paying Living Expenses: Not hard at all  Food Insecurity: No Food Insecurity   Worried About Charity fundraiser in the Last Year: Never true   [place] in the Last Year: Never true  Transportation Needs: No Transportation Needs   Lack of Transportation (Medical): No   Lack of Transportation (Non-Medical): No  Physical Activity: Inactive   Days of Exercise per  Week: 0 days   Minutes of Exercise per Session: 0 min  Stress: No Stress Concern Present   Feeling of Stress : Not at all  Social Connections: Socially Isolated   Frequency of Communication with Friends and Family: More than three times a week   Frequency of Social Gatherings with Friends and Family: Once a week   Attends Religious Services: Never   Marine scientist or Organizations: No   Attends Archivist Meetings: Never   Marital Status: Widowed  Human resources officer Violence: Not At Risk   Fear of Current or Ex-Partner: No   Emotionally Abused: No   Physically Abused: No   Sexually Abused: No      Review of Systems  Genitourinary:  Positive for frequency.  Musculoskeletal:  Positive for back pain.  All other systems reviewed and are negative.     Objective:   Physical Exam Vitals reviewed.  Constitutional:      General: She is not in acute distress.    Appearance: Normal appearance. She is obese. She is not ill-appearing, toxic-appearing or diaphoretic.  HENT:     Right Ear: Tympanic membrane and ear canal normal.     Left Ear: Tympanic membrane and ear canal normal.     Nose: Nose normal. No congestion or rhinorrhea.  Cardiovascular:     Rate and Rhythm: Normal rate and regular rhythm.     Heart sounds: Normal heart sounds. No murmur heard.   No friction rub. No gallop.  Pulmonary:     Effort: Pulmonary effort is normal. No respiratory distress.     Breath sounds: Normal breath sounds. No stridor. No wheezing, rhonchi or rales.  Musculoskeletal:     Right lower leg: No edema.     Left lower leg: No edema.  Neurological:     Mental Status: She is alert and oriented to person, place, and time. Mental status is at baseline.     Sensory: No sensory deficit.     Motor: Weakness present.     Coordination: Coordination abnormal.     Gait: Gait abnormal.  Chronic left facial droop due to her previous remote right basal ganglial infarct         Assessment & Plan:  Type 2 diabetes mellitus with hyperglycemia, without long-term current use of insulin (HCC) - Plan: Hemoglobin A1c, COMPLETE METABOLIC PANEL WITH GFR  Frequent  UTI - Plan: Urinalysis, Routine w reflex microscopic Given her history of chronic frequent urinary tract infections, I did repeat a urinalysis today that showed no evidence of UTI.  Blood pressure today is excellent.  I will repeat an A1c.  Ideally I like to see her A1c around 7.  However her reported sugars sound extremely high therefore I will increase glipizide extended release to 10 mg a day.  The next step would be to discontinue Januvia and replace with Ozempic.  Given her frequent urinary tract infections I do not feel that she is a good candidate for [place] or Jardiance.

## 2021-03-01 LAB — COMPLETE METABOLIC PANEL WITH GFR
AG Ratio: 2.1 (calc) (ref 1.0–2.5)
ALT: 9 U/L (ref 6–29)
AST: 11 U/L (ref 10–35)
Albumin: 3.7 g/dL (ref 3.6–5.1)
Alkaline phosphatase (APISO): 68 U/L (ref 37–153)
BUN/Creatinine Ratio: 16 (calc) (ref 6–22)
BUN: 22 mg/dL (ref 7–25)
CO2: 30 mmol/L (ref 20–32)
Calcium: 8.8 mg/dL (ref 8.6–10.4)
Chloride: 101 mmol/L (ref 98–110)
Creat: 1.38 mg/dL — ABNORMAL HIGH (ref 0.60–0.95)
Globulin: 1.8 g/dL (calc) — ABNORMAL LOW (ref 1.9–3.7)
Glucose, Bld: 332 mg/dL — ABNORMAL HIGH (ref 65–99)
Potassium: 4.9 mmol/L (ref 3.5–5.3)
Sodium: 136 mmol/L (ref 135–146)
Total Bilirubin: 0.3 mg/dL (ref 0.2–1.2)
Total Protein: 5.5 g/dL — ABNORMAL LOW (ref 6.1–8.1)
eGFR: 37 mL/min/{1.73_m2} — ABNORMAL LOW (ref >=60)

## 2021-03-01 LAB — HEMOGLOBIN A1C
Hgb A1c MFr Bld: 7.3 % of total Hgb — ABNORMAL HIGH (ref <5.7)
Mean Plasma Glucose: 163 mg/dL
eAG (mmol/L): 9 mmol/L

## 2021-03-03 ENCOUNTER — Other Ambulatory Visit: Payer: Self-pay | Admitting: Family Medicine

## 2021-03-14 ENCOUNTER — Telehealth: Payer: Self-pay | Admitting: *Deleted

## 2021-03-14 DIAGNOSIS — Z7409 Other reduced mobility: Secondary | ICD-10-CM

## 2021-03-14 NOTE — Telephone Encounter (Signed)
Received call from patient caregiver, [place].   Reports that patient was recently seen in ER and then F/U with PCP for weakness.   Requested orders for [facility] SN/ PT.   Please advise.

## 2021-03-15 NOTE — Telephone Encounter (Signed)
Orders placed.

## 2021-03-16 DIAGNOSIS — E1165 Type 2 diabetes mellitus with hyperglycemia: Secondary | ICD-10-CM | POA: Diagnosis not present

## 2021-03-16 DIAGNOSIS — E1169 Type 2 diabetes mellitus with other specified complication: Secondary | ICD-10-CM | POA: Diagnosis not present

## 2021-03-16 DIAGNOSIS — N184 Chronic kidney disease, stage 4 (severe): Secondary | ICD-10-CM | POA: Diagnosis not present

## 2021-03-16 DIAGNOSIS — E11319 Type 2 diabetes mellitus with unspecified diabetic retinopathy without macular edema: Secondary | ICD-10-CM | POA: Diagnosis not present

## 2021-03-16 DIAGNOSIS — D631 Anemia in chronic kidney disease: Secondary | ICD-10-CM | POA: Diagnosis not present

## 2021-03-16 DIAGNOSIS — E1122 Type 2 diabetes mellitus with diabetic chronic kidney disease: Secondary | ICD-10-CM | POA: Diagnosis not present

## 2021-03-16 DIAGNOSIS — I1310 Hypertensive heart and chronic kidney disease without heart failure, with stage 1 through stage 4 chronic kidney disease, or unspecified chronic kidney disease: Secondary | ICD-10-CM | POA: Diagnosis not present

## 2021-03-16 DIAGNOSIS — I700 Atherosclerosis of aorta: Secondary | ICD-10-CM | POA: Diagnosis not present

## 2021-03-16 DIAGNOSIS — E785 Hyperlipidemia, unspecified: Secondary | ICD-10-CM | POA: Diagnosis not present

## 2021-03-16 DIAGNOSIS — R69 Illness, unspecified: Secondary | ICD-10-CM | POA: Diagnosis not present

## 2021-03-17 DIAGNOSIS — I700 Atherosclerosis of aorta: Secondary | ICD-10-CM | POA: Diagnosis not present

## 2021-03-17 DIAGNOSIS — E1122 Type 2 diabetes mellitus with diabetic chronic kidney disease: Secondary | ICD-10-CM | POA: Diagnosis not present

## 2021-03-17 DIAGNOSIS — E785 Hyperlipidemia, unspecified: Secondary | ICD-10-CM | POA: Diagnosis not present

## 2021-03-17 DIAGNOSIS — I1310 Hypertensive heart and chronic kidney disease without heart failure, with stage 1 through stage 4 chronic kidney disease, or unspecified chronic kidney disease: Secondary | ICD-10-CM | POA: Diagnosis not present

## 2021-03-17 DIAGNOSIS — N184 Chronic kidney disease, stage 4 (severe): Secondary | ICD-10-CM | POA: Diagnosis not present

## 2021-03-17 DIAGNOSIS — E11319 Type 2 diabetes mellitus with unspecified diabetic retinopathy without macular edema: Secondary | ICD-10-CM | POA: Diagnosis not present

## 2021-03-17 DIAGNOSIS — E1169 Type 2 diabetes mellitus with other specified complication: Secondary | ICD-10-CM | POA: Diagnosis not present

## 2021-03-17 DIAGNOSIS — R69 Illness, unspecified: Secondary | ICD-10-CM | POA: Diagnosis not present

## 2021-03-17 DIAGNOSIS — E1165 Type 2 diabetes mellitus with hyperglycemia: Secondary | ICD-10-CM | POA: Diagnosis not present

## 2021-03-20 ENCOUNTER — Telehealth: Payer: Self-pay | Admitting: *Deleted

## 2021-03-20 ENCOUNTER — Other Ambulatory Visit: Payer: Self-pay | Admitting: Pulmonary Disease

## 2021-03-20 NOTE — Telephone Encounter (Signed)
Received call from [place], [facility] PT with [place] (formerly [facility]) [phone] telephone.   Requested VO for HH PT 1x weekly x2 weeks, 2x weekly x4 weeks, 1x weekly x2 weeks for strengthening, balance, gain, activity tolerance, home safety and fall prevention with some pulmonary rehab.   VO given.

## 2021-03-22 DIAGNOSIS — E1165 Type 2 diabetes mellitus with hyperglycemia: Secondary | ICD-10-CM | POA: Diagnosis not present

## 2021-03-22 DIAGNOSIS — R69 Illness, unspecified: Secondary | ICD-10-CM | POA: Diagnosis not present

## 2021-03-22 DIAGNOSIS — N184 Chronic kidney disease, stage 4 (severe): Secondary | ICD-10-CM | POA: Diagnosis not present

## 2021-03-22 DIAGNOSIS — E11319 Type 2 diabetes mellitus with unspecified diabetic retinopathy without macular edema: Secondary | ICD-10-CM | POA: Diagnosis not present

## 2021-03-22 DIAGNOSIS — E785 Hyperlipidemia, unspecified: Secondary | ICD-10-CM | POA: Diagnosis not present

## 2021-03-22 DIAGNOSIS — I1310 Hypertensive heart and chronic kidney disease without heart failure, with stage 1 through stage 4 chronic kidney disease, or unspecified chronic kidney disease: Secondary | ICD-10-CM | POA: Diagnosis not present

## 2021-03-22 DIAGNOSIS — E1122 Type 2 diabetes mellitus with diabetic chronic kidney disease: Secondary | ICD-10-CM | POA: Diagnosis not present

## 2021-03-22 DIAGNOSIS — I700 Atherosclerosis of aorta: Secondary | ICD-10-CM | POA: Diagnosis not present

## 2021-03-22 DIAGNOSIS — E1169 Type 2 diabetes mellitus with other specified complication: Secondary | ICD-10-CM | POA: Diagnosis not present

## 2021-03-27 DIAGNOSIS — I700 Atherosclerosis of aorta: Secondary | ICD-10-CM | POA: Diagnosis not present

## 2021-03-27 DIAGNOSIS — N184 Chronic kidney disease, stage 4 (severe): Secondary | ICD-10-CM | POA: Diagnosis not present

## 2021-03-27 DIAGNOSIS — R69 Illness, unspecified: Secondary | ICD-10-CM | POA: Diagnosis not present

## 2021-03-27 DIAGNOSIS — E785 Hyperlipidemia, unspecified: Secondary | ICD-10-CM | POA: Diagnosis not present

## 2021-03-27 DIAGNOSIS — I1310 Hypertensive heart and chronic kidney disease without heart failure, with stage 1 through stage 4 chronic kidney disease, or unspecified chronic kidney disease: Secondary | ICD-10-CM | POA: Diagnosis not present

## 2021-03-27 DIAGNOSIS — E11319 Type 2 diabetes mellitus with unspecified diabetic retinopathy without macular edema: Secondary | ICD-10-CM | POA: Diagnosis not present

## 2021-03-27 DIAGNOSIS — E1165 Type 2 diabetes mellitus with hyperglycemia: Secondary | ICD-10-CM | POA: Diagnosis not present

## 2021-03-27 DIAGNOSIS — E1169 Type 2 diabetes mellitus with other specified complication: Secondary | ICD-10-CM | POA: Diagnosis not present

## 2021-03-27 DIAGNOSIS — E1122 Type 2 diabetes mellitus with diabetic chronic kidney disease: Secondary | ICD-10-CM | POA: Diagnosis not present

## 2021-03-30 DIAGNOSIS — E1165 Type 2 diabetes mellitus with hyperglycemia: Secondary | ICD-10-CM | POA: Diagnosis not present

## 2021-03-30 DIAGNOSIS — N184 Chronic kidney disease, stage 4 (severe): Secondary | ICD-10-CM | POA: Diagnosis not present

## 2021-03-30 DIAGNOSIS — I1310 Hypertensive heart and chronic kidney disease without heart failure, with stage 1 through stage 4 chronic kidney disease, or unspecified chronic kidney disease: Secondary | ICD-10-CM | POA: Diagnosis not present

## 2021-03-30 DIAGNOSIS — I700 Atherosclerosis of aorta: Secondary | ICD-10-CM | POA: Diagnosis not present

## 2021-03-30 DIAGNOSIS — E1122 Type 2 diabetes mellitus with diabetic chronic kidney disease: Secondary | ICD-10-CM | POA: Diagnosis not present

## 2021-03-30 DIAGNOSIS — E11319 Type 2 diabetes mellitus with unspecified diabetic retinopathy without macular edema: Secondary | ICD-10-CM | POA: Diagnosis not present

## 2021-03-30 DIAGNOSIS — E1169 Type 2 diabetes mellitus with other specified complication: Secondary | ICD-10-CM | POA: Diagnosis not present

## 2021-03-30 DIAGNOSIS — R69 Illness, unspecified: Secondary | ICD-10-CM | POA: Diagnosis not present

## 2021-03-30 DIAGNOSIS — E785 Hyperlipidemia, unspecified: Secondary | ICD-10-CM | POA: Diagnosis not present

## 2021-03-31 DIAGNOSIS — E785 Hyperlipidemia, unspecified: Secondary | ICD-10-CM | POA: Diagnosis not present

## 2021-03-31 DIAGNOSIS — E11319 Type 2 diabetes mellitus with unspecified diabetic retinopathy without macular edema: Secondary | ICD-10-CM | POA: Diagnosis not present

## 2021-03-31 DIAGNOSIS — I700 Atherosclerosis of aorta: Secondary | ICD-10-CM | POA: Diagnosis not present

## 2021-03-31 DIAGNOSIS — R69 Illness, unspecified: Secondary | ICD-10-CM | POA: Diagnosis not present

## 2021-03-31 DIAGNOSIS — I1310 Hypertensive heart and chronic kidney disease without heart failure, with stage 1 through stage 4 chronic kidney disease, or unspecified chronic kidney disease: Secondary | ICD-10-CM | POA: Diagnosis not present

## 2021-03-31 DIAGNOSIS — N184 Chronic kidney disease, stage 4 (severe): Secondary | ICD-10-CM | POA: Diagnosis not present

## 2021-03-31 DIAGNOSIS — E1122 Type 2 diabetes mellitus with diabetic chronic kidney disease: Secondary | ICD-10-CM | POA: Diagnosis not present

## 2021-03-31 DIAGNOSIS — E1169 Type 2 diabetes mellitus with other specified complication: Secondary | ICD-10-CM | POA: Diagnosis not present

## 2021-03-31 DIAGNOSIS — E1165 Type 2 diabetes mellitus with hyperglycemia: Secondary | ICD-10-CM | POA: Diagnosis not present

## 2021-04-03 DIAGNOSIS — I1310 Hypertensive heart and chronic kidney disease without heart failure, with stage 1 through stage 4 chronic kidney disease, or unspecified chronic kidney disease: Secondary | ICD-10-CM | POA: Diagnosis not present

## 2021-04-03 DIAGNOSIS — E1165 Type 2 diabetes mellitus with hyperglycemia: Secondary | ICD-10-CM | POA: Diagnosis not present

## 2021-04-03 DIAGNOSIS — E785 Hyperlipidemia, unspecified: Secondary | ICD-10-CM | POA: Diagnosis not present

## 2021-04-03 DIAGNOSIS — N184 Chronic kidney disease, stage 4 (severe): Secondary | ICD-10-CM | POA: Diagnosis not present

## 2021-04-03 DIAGNOSIS — E1169 Type 2 diabetes mellitus with other specified complication: Secondary | ICD-10-CM | POA: Diagnosis not present

## 2021-04-03 DIAGNOSIS — R69 Illness, unspecified: Secondary | ICD-10-CM | POA: Diagnosis not present

## 2021-04-03 DIAGNOSIS — E11319 Type 2 diabetes mellitus with unspecified diabetic retinopathy without macular edema: Secondary | ICD-10-CM | POA: Diagnosis not present

## 2021-04-03 DIAGNOSIS — I700 Atherosclerosis of aorta: Secondary | ICD-10-CM | POA: Diagnosis not present

## 2021-04-03 DIAGNOSIS — E1122 Type 2 diabetes mellitus with diabetic chronic kidney disease: Secondary | ICD-10-CM | POA: Diagnosis not present

## 2021-04-04 DIAGNOSIS — N3941 Urge incontinence: Secondary | ICD-10-CM | POA: Diagnosis not present

## 2021-04-04 DIAGNOSIS — N3020 Other chronic cystitis without hematuria: Secondary | ICD-10-CM | POA: Diagnosis not present

## 2021-04-04 DIAGNOSIS — R3915 Urgency of urination: Secondary | ICD-10-CM | POA: Diagnosis not present

## 2021-04-05 ENCOUNTER — Other Ambulatory Visit: Payer: Self-pay | Admitting: Pulmonary Disease

## 2021-04-05 DIAGNOSIS — R69 Illness, unspecified: Secondary | ICD-10-CM | POA: Diagnosis not present

## 2021-04-05 DIAGNOSIS — E1122 Type 2 diabetes mellitus with diabetic chronic kidney disease: Secondary | ICD-10-CM | POA: Diagnosis not present

## 2021-04-05 DIAGNOSIS — I700 Atherosclerosis of aorta: Secondary | ICD-10-CM | POA: Diagnosis not present

## 2021-04-05 DIAGNOSIS — E11319 Type 2 diabetes mellitus with unspecified diabetic retinopathy without macular edema: Secondary | ICD-10-CM | POA: Diagnosis not present

## 2021-04-05 DIAGNOSIS — E1165 Type 2 diabetes mellitus with hyperglycemia: Secondary | ICD-10-CM | POA: Diagnosis not present

## 2021-04-05 DIAGNOSIS — E785 Hyperlipidemia, unspecified: Secondary | ICD-10-CM | POA: Diagnosis not present

## 2021-04-05 DIAGNOSIS — E1169 Type 2 diabetes mellitus with other specified complication: Secondary | ICD-10-CM | POA: Diagnosis not present

## 2021-04-05 DIAGNOSIS — N184 Chronic kidney disease, stage 4 (severe): Secondary | ICD-10-CM | POA: Diagnosis not present

## 2021-04-05 DIAGNOSIS — I1310 Hypertensive heart and chronic kidney disease without heart failure, with stage 1 through stage 4 chronic kidney disease, or unspecified chronic kidney disease: Secondary | ICD-10-CM | POA: Diagnosis not present

## 2021-04-10 DIAGNOSIS — E1165 Type 2 diabetes mellitus with hyperglycemia: Secondary | ICD-10-CM | POA: Diagnosis not present

## 2021-04-10 DIAGNOSIS — E785 Hyperlipidemia, unspecified: Secondary | ICD-10-CM | POA: Diagnosis not present

## 2021-04-10 DIAGNOSIS — E1169 Type 2 diabetes mellitus with other specified complication: Secondary | ICD-10-CM | POA: Diagnosis not present

## 2021-04-10 DIAGNOSIS — I1310 Hypertensive heart and chronic kidney disease without heart failure, with stage 1 through stage 4 chronic kidney disease, or unspecified chronic kidney disease: Secondary | ICD-10-CM | POA: Diagnosis not present

## 2021-04-10 DIAGNOSIS — E11319 Type 2 diabetes mellitus with unspecified diabetic retinopathy without macular edema: Secondary | ICD-10-CM | POA: Diagnosis not present

## 2021-04-10 DIAGNOSIS — I700 Atherosclerosis of aorta: Secondary | ICD-10-CM | POA: Diagnosis not present

## 2021-04-10 DIAGNOSIS — R69 Illness, unspecified: Secondary | ICD-10-CM | POA: Diagnosis not present

## 2021-04-10 DIAGNOSIS — E1122 Type 2 diabetes mellitus with diabetic chronic kidney disease: Secondary | ICD-10-CM | POA: Diagnosis not present

## 2021-04-10 DIAGNOSIS — N184 Chronic kidney disease, stage 4 (severe): Secondary | ICD-10-CM | POA: Diagnosis not present

## 2021-04-12 DIAGNOSIS — E1169 Type 2 diabetes mellitus with other specified complication: Secondary | ICD-10-CM | POA: Diagnosis not present

## 2021-04-12 DIAGNOSIS — E11319 Type 2 diabetes mellitus with unspecified diabetic retinopathy without macular edema: Secondary | ICD-10-CM | POA: Diagnosis not present

## 2021-04-12 DIAGNOSIS — R69 Illness, unspecified: Secondary | ICD-10-CM | POA: Diagnosis not present

## 2021-04-12 DIAGNOSIS — E1165 Type 2 diabetes mellitus with hyperglycemia: Secondary | ICD-10-CM | POA: Diagnosis not present

## 2021-04-12 DIAGNOSIS — E1122 Type 2 diabetes mellitus with diabetic chronic kidney disease: Secondary | ICD-10-CM | POA: Diagnosis not present

## 2021-04-12 DIAGNOSIS — E785 Hyperlipidemia, unspecified: Secondary | ICD-10-CM | POA: Diagnosis not present

## 2021-04-12 DIAGNOSIS — I700 Atherosclerosis of aorta: Secondary | ICD-10-CM | POA: Diagnosis not present

## 2021-04-12 DIAGNOSIS — I1310 Hypertensive heart and chronic kidney disease without heart failure, with stage 1 through stage 4 chronic kidney disease, or unspecified chronic kidney disease: Secondary | ICD-10-CM | POA: Diagnosis not present

## 2021-04-12 DIAGNOSIS — N184 Chronic kidney disease, stage 4 (severe): Secondary | ICD-10-CM | POA: Diagnosis not present

## 2021-04-14 DIAGNOSIS — E785 Hyperlipidemia, unspecified: Secondary | ICD-10-CM | POA: Diagnosis not present

## 2021-04-14 DIAGNOSIS — R69 Illness, unspecified: Secondary | ICD-10-CM | POA: Diagnosis not present

## 2021-04-14 DIAGNOSIS — N184 Chronic kidney disease, stage 4 (severe): Secondary | ICD-10-CM | POA: Diagnosis not present

## 2021-04-14 DIAGNOSIS — E11319 Type 2 diabetes mellitus with unspecified diabetic retinopathy without macular edema: Secondary | ICD-10-CM | POA: Diagnosis not present

## 2021-04-14 DIAGNOSIS — E1165 Type 2 diabetes mellitus with hyperglycemia: Secondary | ICD-10-CM | POA: Diagnosis not present

## 2021-04-14 DIAGNOSIS — I700 Atherosclerosis of aorta: Secondary | ICD-10-CM | POA: Diagnosis not present

## 2021-04-14 DIAGNOSIS — E1122 Type 2 diabetes mellitus with diabetic chronic kidney disease: Secondary | ICD-10-CM | POA: Diagnosis not present

## 2021-04-14 DIAGNOSIS — I1310 Hypertensive heart and chronic kidney disease without heart failure, with stage 1 through stage 4 chronic kidney disease, or unspecified chronic kidney disease: Secondary | ICD-10-CM | POA: Diagnosis not present

## 2021-04-14 DIAGNOSIS — E1169 Type 2 diabetes mellitus with other specified complication: Secondary | ICD-10-CM | POA: Diagnosis not present

## 2021-04-17 DIAGNOSIS — N184 Chronic kidney disease, stage 4 (severe): Secondary | ICD-10-CM | POA: Diagnosis not present

## 2021-04-17 DIAGNOSIS — I700 Atherosclerosis of aorta: Secondary | ICD-10-CM | POA: Diagnosis not present

## 2021-04-17 DIAGNOSIS — E11319 Type 2 diabetes mellitus with unspecified diabetic retinopathy without macular edema: Secondary | ICD-10-CM | POA: Diagnosis not present

## 2021-04-17 DIAGNOSIS — E1169 Type 2 diabetes mellitus with other specified complication: Secondary | ICD-10-CM | POA: Diagnosis not present

## 2021-04-17 DIAGNOSIS — E785 Hyperlipidemia, unspecified: Secondary | ICD-10-CM | POA: Diagnosis not present

## 2021-04-17 DIAGNOSIS — E1122 Type 2 diabetes mellitus with diabetic chronic kidney disease: Secondary | ICD-10-CM | POA: Diagnosis not present

## 2021-04-17 DIAGNOSIS — R69 Illness, unspecified: Secondary | ICD-10-CM | POA: Diagnosis not present

## 2021-04-17 DIAGNOSIS — E1165 Type 2 diabetes mellitus with hyperglycemia: Secondary | ICD-10-CM | POA: Diagnosis not present

## 2021-04-17 DIAGNOSIS — I1310 Hypertensive heart and chronic kidney disease without heart failure, with stage 1 through stage 4 chronic kidney disease, or unspecified chronic kidney disease: Secondary | ICD-10-CM | POA: Diagnosis not present

## 2021-04-19 DIAGNOSIS — E1169 Type 2 diabetes mellitus with other specified complication: Secondary | ICD-10-CM | POA: Diagnosis not present

## 2021-04-19 DIAGNOSIS — E1122 Type 2 diabetes mellitus with diabetic chronic kidney disease: Secondary | ICD-10-CM | POA: Diagnosis not present

## 2021-04-19 DIAGNOSIS — E1165 Type 2 diabetes mellitus with hyperglycemia: Secondary | ICD-10-CM | POA: Diagnosis not present

## 2021-04-19 DIAGNOSIS — E11319 Type 2 diabetes mellitus with unspecified diabetic retinopathy without macular edema: Secondary | ICD-10-CM | POA: Diagnosis not present

## 2021-04-19 DIAGNOSIS — I700 Atherosclerosis of aorta: Secondary | ICD-10-CM | POA: Diagnosis not present

## 2021-04-19 DIAGNOSIS — I1310 Hypertensive heart and chronic kidney disease without heart failure, with stage 1 through stage 4 chronic kidney disease, or unspecified chronic kidney disease: Secondary | ICD-10-CM | POA: Diagnosis not present

## 2021-04-19 DIAGNOSIS — R69 Illness, unspecified: Secondary | ICD-10-CM | POA: Diagnosis not present

## 2021-04-19 DIAGNOSIS — N184 Chronic kidney disease, stage 4 (severe): Secondary | ICD-10-CM | POA: Diagnosis not present

## 2021-04-19 DIAGNOSIS — E785 Hyperlipidemia, unspecified: Secondary | ICD-10-CM | POA: Diagnosis not present

## 2021-04-20 DIAGNOSIS — N184 Chronic kidney disease, stage 4 (severe): Secondary | ICD-10-CM | POA: Diagnosis not present

## 2021-04-20 DIAGNOSIS — I700 Atherosclerosis of aorta: Secondary | ICD-10-CM | POA: Diagnosis not present

## 2021-04-20 DIAGNOSIS — E1165 Type 2 diabetes mellitus with hyperglycemia: Secondary | ICD-10-CM | POA: Diagnosis not present

## 2021-04-20 DIAGNOSIS — E11319 Type 2 diabetes mellitus with unspecified diabetic retinopathy without macular edema: Secondary | ICD-10-CM | POA: Diagnosis not present

## 2021-04-20 DIAGNOSIS — E1122 Type 2 diabetes mellitus with diabetic chronic kidney disease: Secondary | ICD-10-CM | POA: Diagnosis not present

## 2021-04-20 DIAGNOSIS — E1169 Type 2 diabetes mellitus with other specified complication: Secondary | ICD-10-CM | POA: Diagnosis not present

## 2021-04-20 DIAGNOSIS — I1310 Hypertensive heart and chronic kidney disease without heart failure, with stage 1 through stage 4 chronic kidney disease, or unspecified chronic kidney disease: Secondary | ICD-10-CM | POA: Diagnosis not present

## 2021-04-20 DIAGNOSIS — E785 Hyperlipidemia, unspecified: Secondary | ICD-10-CM | POA: Diagnosis not present

## 2021-04-20 DIAGNOSIS — R69 Illness, unspecified: Secondary | ICD-10-CM | POA: Diagnosis not present

## 2021-04-28 ENCOUNTER — Other Ambulatory Visit: Payer: Self-pay | Admitting: Family Medicine

## 2021-04-28 DIAGNOSIS — E11319 Type 2 diabetes mellitus with unspecified diabetic retinopathy without macular edema: Secondary | ICD-10-CM | POA: Diagnosis not present

## 2021-04-28 DIAGNOSIS — I700 Atherosclerosis of aorta: Secondary | ICD-10-CM | POA: Diagnosis not present

## 2021-04-28 DIAGNOSIS — R69 Illness, unspecified: Secondary | ICD-10-CM | POA: Diagnosis not present

## 2021-04-28 DIAGNOSIS — E1165 Type 2 diabetes mellitus with hyperglycemia: Secondary | ICD-10-CM | POA: Diagnosis not present

## 2021-04-28 DIAGNOSIS — E1169 Type 2 diabetes mellitus with other specified complication: Secondary | ICD-10-CM | POA: Diagnosis not present

## 2021-04-28 DIAGNOSIS — I1310 Hypertensive heart and chronic kidney disease without heart failure, with stage 1 through stage 4 chronic kidney disease, or unspecified chronic kidney disease: Secondary | ICD-10-CM | POA: Diagnosis not present

## 2021-04-28 DIAGNOSIS — N184 Chronic kidney disease, stage 4 (severe): Secondary | ICD-10-CM | POA: Diagnosis not present

## 2021-04-28 DIAGNOSIS — E1122 Type 2 diabetes mellitus with diabetic chronic kidney disease: Secondary | ICD-10-CM | POA: Diagnosis not present

## 2021-04-28 DIAGNOSIS — E785 Hyperlipidemia, unspecified: Secondary | ICD-10-CM | POA: Diagnosis not present

## 2021-05-01 DIAGNOSIS — E785 Hyperlipidemia, unspecified: Secondary | ICD-10-CM | POA: Diagnosis not present

## 2021-05-01 DIAGNOSIS — E1169 Type 2 diabetes mellitus with other specified complication: Secondary | ICD-10-CM | POA: Diagnosis not present

## 2021-05-01 DIAGNOSIS — N184 Chronic kidney disease, stage 4 (severe): Secondary | ICD-10-CM | POA: Diagnosis not present

## 2021-05-01 DIAGNOSIS — E11319 Type 2 diabetes mellitus with unspecified diabetic retinopathy without macular edema: Secondary | ICD-10-CM | POA: Diagnosis not present

## 2021-05-01 DIAGNOSIS — E1122 Type 2 diabetes mellitus with diabetic chronic kidney disease: Secondary | ICD-10-CM | POA: Diagnosis not present

## 2021-05-01 DIAGNOSIS — E1165 Type 2 diabetes mellitus with hyperglycemia: Secondary | ICD-10-CM | POA: Diagnosis not present

## 2021-05-01 DIAGNOSIS — I1310 Hypertensive heart and chronic kidney disease without heart failure, with stage 1 through stage 4 chronic kidney disease, or unspecified chronic kidney disease: Secondary | ICD-10-CM | POA: Diagnosis not present

## 2021-05-01 DIAGNOSIS — R69 Illness, unspecified: Secondary | ICD-10-CM | POA: Diagnosis not present

## 2021-05-01 DIAGNOSIS — I700 Atherosclerosis of aorta: Secondary | ICD-10-CM | POA: Diagnosis not present

## 2021-05-04 ENCOUNTER — Other Ambulatory Visit: Payer: Self-pay | Admitting: Family Medicine

## 2021-05-04 DIAGNOSIS — E785 Hyperlipidemia, unspecified: Secondary | ICD-10-CM | POA: Diagnosis not present

## 2021-05-04 DIAGNOSIS — E1169 Type 2 diabetes mellitus with other specified complication: Secondary | ICD-10-CM | POA: Diagnosis not present

## 2021-05-04 DIAGNOSIS — E1165 Type 2 diabetes mellitus with hyperglycemia: Secondary | ICD-10-CM | POA: Diagnosis not present

## 2021-05-04 DIAGNOSIS — E1122 Type 2 diabetes mellitus with diabetic chronic kidney disease: Secondary | ICD-10-CM | POA: Diagnosis not present

## 2021-05-04 DIAGNOSIS — N184 Chronic kidney disease, stage 4 (severe): Secondary | ICD-10-CM | POA: Diagnosis not present

## 2021-05-04 DIAGNOSIS — I1310 Hypertensive heart and chronic kidney disease without heart failure, with stage 1 through stage 4 chronic kidney disease, or unspecified chronic kidney disease: Secondary | ICD-10-CM | POA: Diagnosis not present

## 2021-05-04 DIAGNOSIS — I700 Atherosclerosis of aorta: Secondary | ICD-10-CM | POA: Diagnosis not present

## 2021-05-04 DIAGNOSIS — E11319 Type 2 diabetes mellitus with unspecified diabetic retinopathy without macular edema: Secondary | ICD-10-CM | POA: Diagnosis not present

## 2021-05-04 DIAGNOSIS — R69 Illness, unspecified: Secondary | ICD-10-CM | POA: Diagnosis not present

## 2021-05-09 ENCOUNTER — Other Ambulatory Visit: Payer: Self-pay

## 2021-05-09 ENCOUNTER — Other Ambulatory Visit: Payer: Self-pay | Admitting: Family Medicine

## 2021-05-09 ENCOUNTER — Ambulatory Visit (INDEPENDENT_AMBULATORY_CARE_PROVIDER_SITE_OTHER): Payer: Medicare HMO | Admitting: Otolaryngology

## 2021-05-09 DIAGNOSIS — H6122 Impacted cerumen, left ear: Secondary | ICD-10-CM | POA: Diagnosis not present

## 2021-05-09 DIAGNOSIS — N390 Urinary tract infection, site not specified: Secondary | ICD-10-CM

## 2021-05-09 NOTE — Progress Notes (Signed)
HPI: Renee [name] is a 87 y.o. female who returns today for evaluation of myringotomy tubes referred by our audiologist.  She wears bilateral hearing aids.  On recent exam apparently the left myringotomy tube had extruded.  This was placed about 5 or 6 months ago..  Past Medical History:  Diagnosis Date   Angioedema    possibly from voltaren   Bronchopneumonia 12/11/2016   Degenerative disc disease    Diabetes mellitus    type II   Hyperlipidemia    Hypertension    LVH (left ventricular hypertrophy)    and atrial enlargement by echo in past with nl EF   Nasal pruritis    Osteoarthritis    Osteopenia    Renal insufficiency    Sleep apnea    Stroke ([place]) 05/2010   Small vessel sobcortical (in [place]) with Dr Leonie Man, residual L hemiparesis   Vitamin B 12 deficiency 04/08   Past Surgical History:  Procedure Laterality Date   ABDOMINAL HYSTERECTOMY     BSO-fibroids   APPENDECTOMY     BACK SURGERY     COLON SURGERY     due to punctured intestines   EYE SURGERY     cataract extraction   IR THORACENTESIS ASP PLEURAL SPACE W/IMG GUIDE  06/07/2020   KNEE SURGERY     arthroscope   PARS PLANA VITRECTOMY  07/31/2011   Procedure: PARS PLANA VITRECTOMY WITH 25 GAUGE;  Surgeon: [name], MD;  Location: [facility];  Service: Ophthalmology;  Laterality: Right;  REMOVAL OF SILICONE OIL AND LASER RIGHT EYE   RETINAL DETACHMENT SURGERY  02/18/11   times 2   SPINE SURGERY  08/09   spinal decompression surgery   Social History   Socioeconomic History   Marital status: Widowed    Spouse name: Not on file   Number of children: Not on file   Years of education: Not on file   Highest education level: Not on file  Occupational History   Not on file  Tobacco Use   Smoking status: Never   Smokeless tobacco: Never  Vaping Use   Vaping Use: Not on file  Substance and Sexual Activity   Alcohol use: No    Alcohol/week: 0.0 standard drinks   Drug use: No   Sexual activity: Never   Other Topics Concern   Not on file  Social History Narrative   Not on file   Social Determinants of Health   Financial Resource Strain: Low Risk    Difficulty of Paying Living Expenses: Not hard at all  Food Insecurity: No Food Insecurity   Worried About Charity fundraiser in the Last Year: Never true   [place] in the Last Year: Never true  Transportation Needs: No Transportation Needs   Lack of Transportation (Medical): No   Lack of Transportation (Non-Medical): No  Physical Activity: Inactive   Days of Exercise per Week: 0 days   Minutes of Exercise per Session: 0 min  Stress: No Stress Concern Present   Feeling of Stress : Not at all  Social Connections: Socially Isolated   Frequency of Communication with Friends and Family: More than three times a week   Frequency of Social Gatherings with Friends and Family: Once a week   Attends Religious Services: Never   Marine scientist or Organizations: No   Attends Archivist Meetings: Never   Marital Status: Widowed   Family History  Problem Relation Age of Onset  COPD Brother    Cancer Sister        brain tumor with hemmorhage   Heart disease Sister        CAD   Allergies  Allergen Reactions   Diclofenac Sodium Other (See Comments)    REACTION: angioedema   Pioglitazone Swelling   Prior to Admission medications   Medication Sig Start Date End Date Taking? Authorizing Provider  albuterol (VENTOLIN HFA) 108 (90 Base) MCG/ACT inhaler INHALE 2 PUFFS INTO THE LUNGS EVERY 4 HOURS AS NEEDED FOR WHEEZING/SHORTNESS OF BREATH 10/21/20   Tower, Wynelle Fanny, MD  ALPRAZolam (XANAX) 0.5 MG tablet TAKE 0.5-1 TABLETS (0.25-0.5 MG TOTAL) BY MOUTH 2 (TWO) TIMES DAILY AS NEEDED FOR ANXIETY. 02/21/21   [name], MD  amLODipine (NORVASC) 10 MG tablet Take 1 tablet (10 mg total) by mouth daily. 09/28/20   [name], MD  Blood Glucose Monitoring Suppl (ONE TOUCH ULTRA 2) w/Device KIT Check blood sugar once daily  and as directed. Dx E11.9 08/27/18   Tower, Wynelle Fanny, MD  calcium-vitamin D (OSCAL WITH D) 500-200 MG-UNIT per tablet Take 1 tablet by mouth daily.    [provider]  cephALEXin (KEFLEX) 500 MG capsule TAKE 1 CAPSULE (500 MG TOTAL) BY MOUTH DAILY. 05/09/21   [name], MD  Cholecalciferol (VITAMIN D) 1000 UNITS capsule Take 1,000 Units by mouth daily.    [provider]  Cranberry 500 MG TABS Take 500 mg by mouth in the morning and at bedtime.    [provider]  docusate sodium (COLACE) 100 MG capsule Take 200 mg by mouth at bedtime.    [provider]  glipiZIDE (GLIPIZIDE XL) 10 MG 24 hr tablet Take 1 tablet (10 mg total) by mouth daily with breakfast. 02/28/21   [name], MD  hydrALAZINE (APRESOLINE) 100 MG tablet Take 1 tablet (100 mg total) by mouth 3 (three) times daily. 09/28/20   [name], MD  metoprolol succinate (TOPROL-XL) 25 MG 24 hr tablet Take 0.5 tablets (12.5 mg total) by mouth 2 (two) times daily. 09/28/20   [name], MD  mirtazapine (REMERON) 15 MG tablet TAKE 1/2 TABLET BY MOUTH AT BEDTIME 08/01/20   Tower, Wynelle Fanny, MD  Multiple Vitamin (MULTIVITAMIN) capsule Take 1 capsule by mouth daily.    [provider]  Multiple Vitamins-Minerals (PRESERVISION AREDS 2+MULTI VIT) CAPS Take 1 capsule by mouth in the morning and at bedtime.    [provider]  Olopatadine HCl (PATADAY OP) Place 1 drop into both eyes daily.    [provider]  OneTouch Delica Lancets 75I MISC CHECK BLOOD SUGAR ONCE DAILY AND AS DIRECTED 05/04/21   [name], MD  ONETOUCH ULTRA test strip CHECK BLOOD SUGAR ONCE DAILY AND AS DIRECTED. DX E11.9 12/27/20   [name], MD  sertraline (ZOLOFT) 50 MG tablet TAKE 1 TABLET BY MOUTH EVERY DAY 03/03/21   [name], MD  sitaGLIPtin (JANUVIA) 100 MG tablet Take 1 tablet (100 mg total) by mouth daily. 09/28/20   [name], MD  trandolapril (MAVIK) 4 MG tablet  Take 1 tablet (4 mg total) by mouth daily. 09/28/20   [name], MD  triamcinolone (NASACORT) 55 MCG/ACT AERO nasal inhaler Place 2 sprays into the nose daily. Patient taking differently: Place 2 sprays into the nose at bedtime. 03/28/20   [name], MD  vitamin B-12 (CYANOCOBALAMIN) 1000 MCG tablet Take 1,000 mcg by mouth daily.  [provider]     Positive ROS: Otherwise negative  All other systems have been reviewed and were otherwise negative with the exception of those mentioned in the HPI and as above.  Physical Exam: Constitutional: Alert, well-appearing, no acute distress Ears: External ears without lesions or tenderness. Ear canal on the left side was clear with a patent myringotomy tube.  Left ear canal has some dried blood that was removed and the tube was within the dried blood.  The left TM was slightly retracted but clear otherwise with no obvious middle ear effusion noted.  She heard better after removing the dried blood and tube from the left ear canal.  She is scheduled to get a follow-up hearing test. Nasal: External nose without lesions.. Clear nasal passages Oral: Lips and gums without lesions. Tongue and palate mucosa without lesions. Posterior oropharynx clear. Neck: No palpable adenopathy or masses Respiratory: Breathing comfortably  Skin: No facial/neck lesions or rash noted.  Cerumen impaction removal  Date/Time: 05/09/2021 5:36 PM Performed by: [name], MD Authorized by: [name], MD   Consent:    Consent obtained:  Verbal   Consent given by:  Patient   Risks discussed:  Pain and bleeding Procedure details:    Location:  L ear   Procedure type: curette and forceps   Post-procedure details:    Inspection:  TM intact and canal normal   Hearing quality:  Improved   Procedure completion:  Tolerated well, no immediate complications Comments:     Left ear canal with dried blood adjacent to the TM that was  removed.  The myringotomy tube is within the dry blood.  The TM is retracted but otherwise clear.  Assessment: Only extrusion of left myringotomy tube with dried blood in the left ear canal that was cleaned in the office.  Plan: Would not recommend replacement of the left myringotomy tube and less she has significant conductive hearing loss more than 15 DB on repeat hearing test.  She heard better today after cleaning the left ear canal.   Radene Journey, MD

## 2021-05-11 DIAGNOSIS — N2581 Secondary hyperparathyroidism of renal origin: Secondary | ICD-10-CM | POA: Diagnosis not present

## 2021-05-11 DIAGNOSIS — I10 Essential (primary) hypertension: Secondary | ICD-10-CM | POA: Diagnosis not present

## 2021-05-11 DIAGNOSIS — R600 Localized edema: Secondary | ICD-10-CM | POA: Diagnosis not present

## 2021-05-11 DIAGNOSIS — N1832 Chronic kidney disease, stage 3b: Secondary | ICD-10-CM | POA: Diagnosis not present

## 2021-05-12 DIAGNOSIS — E1122 Type 2 diabetes mellitus with diabetic chronic kidney disease: Secondary | ICD-10-CM | POA: Diagnosis not present

## 2021-05-12 DIAGNOSIS — E785 Hyperlipidemia, unspecified: Secondary | ICD-10-CM | POA: Diagnosis not present

## 2021-05-12 DIAGNOSIS — R69 Illness, unspecified: Secondary | ICD-10-CM | POA: Diagnosis not present

## 2021-05-12 DIAGNOSIS — I1310 Hypertensive heart and chronic kidney disease without heart failure, with stage 1 through stage 4 chronic kidney disease, or unspecified chronic kidney disease: Secondary | ICD-10-CM | POA: Diagnosis not present

## 2021-05-12 DIAGNOSIS — E11319 Type 2 diabetes mellitus with unspecified diabetic retinopathy without macular edema: Secondary | ICD-10-CM | POA: Diagnosis not present

## 2021-05-12 DIAGNOSIS — E1165 Type 2 diabetes mellitus with hyperglycemia: Secondary | ICD-10-CM | POA: Diagnosis not present

## 2021-05-12 DIAGNOSIS — I700 Atherosclerosis of aorta: Secondary | ICD-10-CM | POA: Diagnosis not present

## 2021-05-12 DIAGNOSIS — N184 Chronic kidney disease, stage 4 (severe): Secondary | ICD-10-CM | POA: Diagnosis not present

## 2021-05-12 DIAGNOSIS — E1169 Type 2 diabetes mellitus with other specified complication: Secondary | ICD-10-CM | POA: Diagnosis not present

## 2021-05-15 ENCOUNTER — Other Ambulatory Visit: Payer: Self-pay | Admitting: Family Medicine

## 2021-05-15 NOTE — Telephone Encounter (Signed)
Ok to refill??  Last office visit 02/28/2021.  Last refill 02/21/2021, #2 refills.

## 2021-05-16 ENCOUNTER — Telehealth: Payer: Self-pay | Admitting: *Deleted

## 2021-05-16 NOTE — Telephone Encounter (Signed)
Received call from [facility] [phone]~ telephone.   Reports that [facility] will D/C, but [facility] PT will remain.   Received call from [place], [place] [phone]~ telephone.   Requested VO to extend [facility] PT 2x weekly x3 weeks, then 1x weekly x2 weeks for gait training. VO given.

## 2021-05-18 ENCOUNTER — Other Ambulatory Visit: Payer: Medicare HMO

## 2021-05-18 ENCOUNTER — Telehealth: Payer: Self-pay

## 2021-05-18 ENCOUNTER — Other Ambulatory Visit: Payer: Self-pay

## 2021-05-18 DIAGNOSIS — I700 Atherosclerosis of aorta: Secondary | ICD-10-CM | POA: Diagnosis not present

## 2021-05-18 DIAGNOSIS — R69 Illness, unspecified: Secondary | ICD-10-CM | POA: Diagnosis not present

## 2021-05-18 DIAGNOSIS — E1122 Type 2 diabetes mellitus with diabetic chronic kidney disease: Secondary | ICD-10-CM | POA: Diagnosis not present

## 2021-05-18 DIAGNOSIS — E1169 Type 2 diabetes mellitus with other specified complication: Secondary | ICD-10-CM | POA: Diagnosis not present

## 2021-05-18 DIAGNOSIS — N184 Chronic kidney disease, stage 4 (severe): Secondary | ICD-10-CM | POA: Diagnosis not present

## 2021-05-18 DIAGNOSIS — I1310 Hypertensive heart and chronic kidney disease without heart failure, with stage 1 through stage 4 chronic kidney disease, or unspecified chronic kidney disease: Secondary | ICD-10-CM | POA: Diagnosis not present

## 2021-05-18 DIAGNOSIS — R350 Frequency of micturition: Secondary | ICD-10-CM | POA: Diagnosis not present

## 2021-05-18 DIAGNOSIS — E11319 Type 2 diabetes mellitus with unspecified diabetic retinopathy without macular edema: Secondary | ICD-10-CM | POA: Diagnosis not present

## 2021-05-18 DIAGNOSIS — E785 Hyperlipidemia, unspecified: Secondary | ICD-10-CM | POA: Diagnosis not present

## 2021-05-18 DIAGNOSIS — D631 Anemia in chronic kidney disease: Secondary | ICD-10-CM | POA: Diagnosis not present

## 2021-05-18 DIAGNOSIS — E1165 Type 2 diabetes mellitus with hyperglycemia: Secondary | ICD-10-CM | POA: Diagnosis not present

## 2021-05-18 NOTE — Telephone Encounter (Signed)
Renee [name], with [facility] called in requesting 3 more weeks of home health with an RN. Renee [name] also stated that she believes the pt may have a UTI and would like to bring a urine sample to office. She would like for a call back about the orders being extended 3 more weeks.  Cb#: [phone]

## 2021-05-19 ENCOUNTER — Other Ambulatory Visit: Payer: Self-pay | Admitting: Family Medicine

## 2021-05-19 DIAGNOSIS — I700 Atherosclerosis of aorta: Secondary | ICD-10-CM | POA: Diagnosis not present

## 2021-05-19 DIAGNOSIS — E1122 Type 2 diabetes mellitus with diabetic chronic kidney disease: Secondary | ICD-10-CM | POA: Diagnosis not present

## 2021-05-19 DIAGNOSIS — R69 Illness, unspecified: Secondary | ICD-10-CM | POA: Diagnosis not present

## 2021-05-19 DIAGNOSIS — I1310 Hypertensive heart and chronic kidney disease without heart failure, with stage 1 through stage 4 chronic kidney disease, or unspecified chronic kidney disease: Secondary | ICD-10-CM | POA: Diagnosis not present

## 2021-05-19 DIAGNOSIS — E785 Hyperlipidemia, unspecified: Secondary | ICD-10-CM | POA: Diagnosis not present

## 2021-05-19 DIAGNOSIS — E11319 Type 2 diabetes mellitus with unspecified diabetic retinopathy without macular edema: Secondary | ICD-10-CM | POA: Diagnosis not present

## 2021-05-19 DIAGNOSIS — E1169 Type 2 diabetes mellitus with other specified complication: Secondary | ICD-10-CM | POA: Diagnosis not present

## 2021-05-19 DIAGNOSIS — N184 Chronic kidney disease, stage 4 (severe): Secondary | ICD-10-CM | POA: Diagnosis not present

## 2021-05-19 DIAGNOSIS — D631 Anemia in chronic kidney disease: Secondary | ICD-10-CM | POA: Diagnosis not present

## 2021-05-19 DIAGNOSIS — E1165 Type 2 diabetes mellitus with hyperglycemia: Secondary | ICD-10-CM | POA: Diagnosis not present

## 2021-05-19 LAB — URINALYSIS
Bilirubin Urine: NEGATIVE
Glucose, UA: NEGATIVE
Hgb urine dipstick: NEGATIVE
Nitrite: NEGATIVE
Specific Gravity, Urine: 1.035 (ref 1.001–1.035)
pH: 5.5 (ref 5.0–8.0)

## 2021-05-19 MED ORDER — SULFAMETHOXAZOLE-TRIMETHOPRIM 800-160 MG PO TABS: 1.0000 | ORAL_TABLET | Freq: Two times a day (BID) | ORAL | 0 refills | Status: DC

## 2021-05-20 LAB — URINE CULTURE
MICRO NUMBER:: 12440178
SPECIMEN QUALITY:: ADEQUATE

## 2021-05-23 ENCOUNTER — Telehealth: Payer: Self-pay

## 2021-05-23 DIAGNOSIS — N184 Chronic kidney disease, stage 4 (severe): Secondary | ICD-10-CM | POA: Diagnosis not present

## 2021-05-23 DIAGNOSIS — E1169 Type 2 diabetes mellitus with other specified complication: Secondary | ICD-10-CM | POA: Diagnosis not present

## 2021-05-23 DIAGNOSIS — I1310 Hypertensive heart and chronic kidney disease without heart failure, with stage 1 through stage 4 chronic kidney disease, or unspecified chronic kidney disease: Secondary | ICD-10-CM | POA: Diagnosis not present

## 2021-05-23 DIAGNOSIS — R69 Illness, unspecified: Secondary | ICD-10-CM | POA: Diagnosis not present

## 2021-05-23 DIAGNOSIS — I700 Atherosclerosis of aorta: Secondary | ICD-10-CM | POA: Diagnosis not present

## 2021-05-23 DIAGNOSIS — E11319 Type 2 diabetes mellitus with unspecified diabetic retinopathy without macular edema: Secondary | ICD-10-CM | POA: Diagnosis not present

## 2021-05-23 DIAGNOSIS — E785 Hyperlipidemia, unspecified: Secondary | ICD-10-CM | POA: Diagnosis not present

## 2021-05-23 DIAGNOSIS — E1122 Type 2 diabetes mellitus with diabetic chronic kidney disease: Secondary | ICD-10-CM | POA: Diagnosis not present

## 2021-05-23 DIAGNOSIS — E1165 Type 2 diabetes mellitus with hyperglycemia: Secondary | ICD-10-CM | POA: Diagnosis not present

## 2021-05-23 DIAGNOSIS — D631 Anemia in chronic kidney disease: Secondary | ICD-10-CM | POA: Diagnosis not present

## 2021-05-23 NOTE — Telephone Encounter (Signed)
VO given.

## 2021-05-23 NOTE — Telephone Encounter (Signed)
Call placed to [facility] SN.   Advised that interactions are noted and advised to continue ABTx.   Advised that patient has x1 day of ABTx remaining. Advised to complete ABTx and repeat UA at next visit. Advised that sample can be brought to office for quicker results.

## 2021-05-23 NOTE — Telephone Encounter (Signed)
Chief Operating Officer, from [facility] called in stating that this pt is having a Level 2 drug interaction with trandolapril and sulfamethoxazole-trimethoprim. RN also stated that pt is having a Level 3 drug interaction with glipiZIDE and sulfamethoxazole-trimethoprim. RN also stated that pt is pretty weak and not feeling well, sugars and bp's having been running a little high. RN also asks if pt can have a recheck of urine at pt's next appt. Please advise.   Please call Christy: [phone]

## 2021-05-24 ENCOUNTER — Other Ambulatory Visit: Payer: Self-pay

## 2021-05-24 ENCOUNTER — Emergency Department
Admission: EM | Admit: 2021-05-24 | Discharge: 2021-05-24 | Disposition: A | Discharge status: Home / Self Care | Payer: Medicare HMO | Attending: Emergency Medicine | Admitting: Emergency Medicine

## 2021-05-24 DIAGNOSIS — N184 Chronic kidney disease, stage 4 (severe): Secondary | ICD-10-CM | POA: Diagnosis not present

## 2021-05-24 DIAGNOSIS — R5381 Other malaise: Secondary | ICD-10-CM | POA: Diagnosis present

## 2021-05-24 DIAGNOSIS — E1165 Type 2 diabetes mellitus with hyperglycemia: Secondary | ICD-10-CM | POA: Insufficient documentation

## 2021-05-24 DIAGNOSIS — R739 Hyperglycemia, unspecified: Secondary | ICD-10-CM | POA: Diagnosis not present

## 2021-05-24 DIAGNOSIS — R531 Weakness: Secondary | ICD-10-CM | POA: Insufficient documentation

## 2021-05-24 DIAGNOSIS — R0902 Hypoxemia: Secondary | ICD-10-CM | POA: Diagnosis not present

## 2021-05-24 DIAGNOSIS — Z79899 Other long term (current) drug therapy: Secondary | ICD-10-CM | POA: Diagnosis not present

## 2021-05-24 DIAGNOSIS — E11319 Type 2 diabetes mellitus with unspecified diabetic retinopathy without macular edema: Secondary | ICD-10-CM | POA: Diagnosis not present

## 2021-05-24 DIAGNOSIS — Z20822 Contact with and (suspected) exposure to covid-19: Secondary | ICD-10-CM | POA: Diagnosis not present

## 2021-05-24 DIAGNOSIS — Z743 Need for continuous supervision: Secondary | ICD-10-CM | POA: Diagnosis not present

## 2021-05-24 DIAGNOSIS — Z7984 Long term (current) use of oral hypoglycemic drugs: Secondary | ICD-10-CM | POA: Insufficient documentation

## 2021-05-24 DIAGNOSIS — N179 Acute kidney failure, unspecified: Secondary | ICD-10-CM

## 2021-05-24 DIAGNOSIS — I129 Hypertensive chronic kidney disease with stage 1 through stage 4 chronic kidney disease, or unspecified chronic kidney disease: Secondary | ICD-10-CM | POA: Diagnosis not present

## 2021-05-24 DIAGNOSIS — E860 Dehydration: Secondary | ICD-10-CM

## 2021-05-24 DIAGNOSIS — I10 Essential (primary) hypertension: Secondary | ICD-10-CM | POA: Diagnosis not present

## 2021-05-24 LAB — CBC
HCT: 38.9 % (ref 36.0–46.0)
Hemoglobin: 12.9 g/dL (ref 12.0–15.0)
MCH: 29 pg (ref 26.0–34.0)
MCHC: 33.2 g/dL (ref 30.0–36.0)
MCV: 87.4 fL (ref 80.0–100.0)
Platelets: 222 10*3/uL (ref 150–400)
RBC: 4.45 MIL/uL (ref 3.87–5.11)
RDW: 13.7 % (ref 11.5–15.5)
WBC: 7.4 10*3/uL (ref 4.0–10.5)
nRBC: 0 % (ref 0.0–0.2)

## 2021-05-24 LAB — BASIC METABOLIC PANEL
Anion gap: 6 (ref 5–15)
BUN: 25 mg/dL — ABNORMAL HIGH (ref 8–23)
CO2: 25 mmol/L (ref 22–32)
Calcium: 8.8 mg/dL — ABNORMAL LOW (ref 8.9–10.3)
Chloride: 103 mmol/L (ref 98–111)
Creatinine, Ser: 1.96 mg/dL — ABNORMAL HIGH (ref 0.44–1.00)
GFR, Estimated: 24 mL/min — ABNORMAL LOW (ref >60)
Glucose, Bld: 265 mg/dL — ABNORMAL HIGH (ref 70–99)
Potassium: 4.3 mmol/L (ref 3.5–5.1)
Sodium: 134 mmol/L — ABNORMAL LOW (ref 135–145)

## 2021-05-24 LAB — URINALYSIS, COMPLETE (UACMP) WITH MICROSCOPIC
Bacteria, UA: NONE SEEN
Bilirubin Urine: NEGATIVE
Glucose, UA: NEGATIVE mg/dL
Hgb urine dipstick: NEGATIVE
Ketones, ur: 5 mg/dL — AB
Leukocytes,Ua: NEGATIVE
Nitrite: NEGATIVE
Protein, ur: 100 mg/dL — AB
Specific Gravity, Urine: 1.031 — ABNORMAL HIGH (ref 1.005–1.030)
WBC, UA: NONE SEEN WBC/hpf (ref 0–5)
pH: 5 (ref 5.0–8.0)

## 2021-05-24 LAB — TSH: TSH: 1.993 u[IU]/mL (ref 0.350–4.500)

## 2021-05-24 LAB — CBG MONITORING, ED
Glucose-Capillary: 244 mg/dL — ABNORMAL HIGH (ref 70–99)
Glucose-Capillary: 184 mg/dL — ABNORMAL HIGH (ref 70–99)

## 2021-05-24 LAB — HEPATIC FUNCTION PANEL
ALT: 16 U/L (ref 0–44)
AST: 14 U/L — ABNORMAL LOW (ref 15–41)
Albumin: 3.7 g/dL (ref 3.5–5.0)
Alkaline Phosphatase: 66 U/L (ref 38–126)
Bilirubin, Direct: <0.1 mg/dL (ref 0.0–0.2)
Total Bilirubin: 0.6 mg/dL (ref 0.3–1.2)
Total Protein: 5.7 g/dL — ABNORMAL LOW (ref 6.5–8.1)

## 2021-05-24 LAB — RESP PANEL BY RT-PCR (FLU A&B, COVID) ARPGX2
Influenza A by PCR: NEGATIVE
Influenza B by PCR: NEGATIVE
SARS Coronavirus 2 by RT PCR: NEGATIVE

## 2021-05-24 LAB — TROPONIN I (HIGH SENSITIVITY): Troponin I (High Sensitivity): 7 ng/L (ref <18)

## 2021-05-24 LAB — MAGNESIUM: Magnesium: 2 mg/dL (ref 1.7–2.4)

## 2021-05-24 MED ORDER — LACTATED RINGERS IV BOLUS
1000.0000 mL | Freq: Once | INTRAVENOUS | Status: AC
  Administered 2021-05-24: 1000 mL via INTRAVENOUS

## 2021-05-24 MED ORDER — INSULIN ASPART 100 UNIT/ML IJ SOLN
0.0000 [IU] | INTRAMUSCULAR | Status: DC
  Administered 2021-05-24: 5 [IU] via SUBCUTANEOUS
  Administered 2021-05-24: 3 [IU] via SUBCUTANEOUS
  Filled 2021-05-24 (×2): qty 1

## 2021-05-24 NOTE — ED Notes (Signed)
Iv infusing  purewick in place.  Family with pt.

## 2021-05-24 NOTE — ED Notes (Signed)
Fsbs 244

## 2021-05-24 NOTE — Discharge Instructions (Addendum)
Please follow-up with your primary care doctor to have your kidney function and glucose rechecked in 2 or 3 days.

## 2021-05-24 NOTE — ED Triage Notes (Signed)
First Nurse note:  Arrives from home via [facility].  Per report, patient is becoming more weak over past day.  Recent UTI, treated with antibiotic.  192/80 50 16 99% - 2l/ [place] -- placed on 2l because RA sat was 92. CBG:  317

## 2021-05-24 NOTE — ED Notes (Signed)
Report off to brandon rn  

## 2021-05-24 NOTE — ED Triage Notes (Signed)
See first nurse note. Pt states that this morning she woke up in sweat, weaker than normal, tired, and nauseous. Thinks she is on her last day of antibiotics.

## 2021-05-24 NOTE — ED Provider Notes (Signed)
[facility] Emergency Department Provider Note  ____________________________________________   Event Date/Time   First MD Initiated Contact with Patient 05/24/21 1711     (approximate)  I have reviewed the triage vital signs and the nursing notes.   HISTORY  Chief Complaint Weakness and Nausea   HPI Renee [name] is a 87 y.o. female with a past medical history of DM, HTN, HDL, LVH, osteoarthritis, some CKD and previous stroke with some residual left-sided weakness and right-sided facial droop and recently diagnosed with a UTI having completed a course of Bactrim yesterday who presents coming by daughter for assessment of continued decreased appetite, generalized weakness and malaise as well as some diaphoresis and near syncope earlier today.  Patient states he was sitting flexor sublimis got a pass out.  He denies that she ever had any burning with urination, abdominal pain, back pain, fevers, vomiting, diarrhea, blood in her urine, chest pain, cough, shortness of breath or any other recent sick symptoms.  Denies any recent falls or injuries.  He does have a home health aide Monday through Friday that has been helping her with things.  No other acute concerns at this time.           Past Medical History:  Diagnosis Date   Angioedema    possibly from voltaren   Bronchopneumonia 12/11/2016   Degenerative disc disease    Diabetes mellitus    type II   Hyperlipidemia    Hypertension    LVH (left ventricular hypertrophy)    and atrial enlargement by echo in past with nl EF   Nasal pruritis    Osteoarthritis    Osteopenia    Renal insufficiency    Sleep apnea    Stroke ([place]) 05/2010   Small vessel sobcortical (in [place] trial) with Dr Leonie Man, residual L hemiparesis   Vitamin B 12 deficiency 04/08    Patient Active Problem List   Diagnosis Date Noted   Pneumothorax 08/09/2020   Pleural effusion 05/13/2020   Frequent UTI 05/13/2020   Dizziness  02/12/2020   Normocytic anemia 02/12/2020   Fall involving sidewalk curb 11/29/2019   Contusion of back 11/27/2019   Hyperlipidemia associated with type 2 diabetes mellitus ([place])    Aortic atherosclerosis ([place]) 07/13/2019   H/O sepsis 07/01/2019   CKD (chronic kidney disease) stage 4, GFR 15-29 ml/min ([place]) 07/01/2019   Lower abdominal pain 05/11/2019   Routine general medical examination at a health care facility 03/30/2019   External hemorrhoid 01/17/2018   History of colitis 01/17/2018   Generalized weakness 12/24/2016   Diabetic retinopathy ([place]) 12/11/2016   History of CVA (cerebrovascular accident) 12/11/2016   Community acquired pneumonia of left lower lobe of lung 10/07/2015   Estrogen deficiency 08/30/2015   Encounter for Medicare annual wellness exam 04/17/2013   Chest wall pain 04/07/2013   Mobility impaired 06/26/2011   History of retinal detachment 01/10/2011   Sleep apnea 11/28/2010   Anxiety and depression 08/25/2010   Hemiplegia, late effect of cerebrovascular disease ([place]) 07/05/2010   POSTHERPETIC NEURALGIA 11/09/2009   Renal insufficiency 06/29/2008   Chronic back pain 01/26/2008   EDEMA 01/26/2008   B12 deficiency 01/10/2007   Type 2 diabetes, controlled, with retinopathy ([place]) 11/27/2006   Essential hypertension 11/27/2006   FIBROCYSTIC BREAST DISEASE 11/27/2006   ROSACEA 11/27/2006   OSTEOARTHRITIS 11/27/2006   URINARY INCONTINENCE, MIXED 11/27/2006    Past Surgical History:  Procedure Laterality Date   ABDOMINAL HYSTERECTOMY     BSO-fibroids  APPENDECTOMY     BACK SURGERY     COLON SURGERY     due to punctured intestines   EYE SURGERY     cataract extraction   IR THORACENTESIS ASP PLEURAL SPACE W/IMG GUIDE  06/07/2020   KNEE SURGERY     arthroscope   PARS PLANA VITRECTOMY  07/31/2011   Procedure: PARS PLANA VITRECTOMY WITH 25 GAUGE;  Surgeon: [name], MD;  Location: [facility];  Service: Ophthalmology;  Laterality: Right;  REMOVAL OF  SILICONE OIL AND LASER RIGHT EYE   RETINAL DETACHMENT SURGERY  02/18/11   times 2   SPINE SURGERY  08/09   spinal decompression surgery    Prior to Admission medications   Medication Sig Start Date End Date Taking? Authorizing Provider  albuterol (VENTOLIN HFA) 108 (90 Base) MCG/ACT inhaler INHALE 2 PUFFS INTO THE LUNGS EVERY 4 HOURS AS NEEDED FOR WHEEZING/SHORTNESS OF BREATH 10/21/20   Tower, Wynelle Fanny, MD  ALPRAZolam (XANAX) 0.5 MG tablet TAKE 1/2 TO 1 TABLET BY MOUTH TWICE A DAY AS NEEDED FOR ANXIETY 05/16/21   [name], MD  amLODipine (NORVASC) 10 MG tablet Take 1 tablet (10 mg total) by mouth daily. 09/28/20   [name], MD  Blood Glucose Monitoring Suppl (ONE TOUCH ULTRA 2) w/Device KIT Check blood sugar once daily and as directed. Dx E11.9 08/27/18   Tower, Wynelle Fanny, MD  calcium-vitamin D (OSCAL WITH D) 500-200 MG-UNIT per tablet Take 1 tablet by mouth daily.    [provider]  cephALEXin (KEFLEX) 500 MG capsule TAKE 1 CAPSULE (500 MG TOTAL) BY MOUTH DAILY. 05/09/21   [name], MD  Cholecalciferol (VITAMIN D) 1000 UNITS capsule Take 1,000 Units by mouth daily.    [provider]  Cranberry 500 MG TABS Take 500 mg by mouth in the morning and at bedtime.    [provider]  docusate sodium (COLACE) 100 MG capsule Take 200 mg by mouth at bedtime.    [provider]  glipiZIDE (GLIPIZIDE XL) 10 MG 24 hr tablet Take 1 tablet (10 mg total) by mouth daily with breakfast. 02/28/21   [name], MD  hydrALAZINE (APRESOLINE) 100 MG tablet Take 1 tablet (100 mg total) by mouth 3 (three) times daily. 09/28/20   [name], MD  metoprolol succinate (TOPROL-XL) 25 MG 24 hr tablet Take 0.5 tablets (12.5 mg total) by mouth 2 (two) times daily. 09/28/20   [name], MD  mirtazapine (REMERON) 15 MG tablet TAKE 1/2 TABLET BY MOUTH AT BEDTIME 08/01/20   Tower, Wynelle Fanny, MD  Multiple Vitamin (MULTIVITAMIN) capsule Take 1 capsule by mouth  daily.    [provider]  Multiple Vitamins-Minerals (PRESERVISION AREDS 2+MULTI VIT) CAPS Take 1 capsule by mouth in the morning and at bedtime.    [provider]  Olopatadine HCl (PATADAY OP) Place 1 drop into both eyes daily.    [provider]  OneTouch Delica Lancets 19Q MISC CHECK BLOOD SUGAR ONCE DAILY AND AS DIRECTED 05/04/21   [name], MD  ONETOUCH ULTRA test strip CHECK BLOOD SUGAR ONCE DAILY AND AS DIRECTED. DX E11.9 12/27/20   [name], MD  sertraline (ZOLOFT) 50 MG tablet TAKE 1 TABLET BY MOUTH EVERY DAY 03/03/21   [name], MD  sitaGLIPtin (JANUVIA) 100 MG tablet Take 1 tablet (100 mg total) by mouth daily. 09/28/20   [name], MD  sulfamethoxazole-trimethoprim (BACTRIM DS) 800-160 MG tablet Take  1 tablet by mouth 2 (two) times daily. 05/19/21   [name], MD  trandolapril (MAVIK) 4 MG tablet Take 1 tablet (4 mg total) by mouth daily. 09/28/20   [name], MD  triamcinolone (NASACORT) 55 MCG/ACT AERO nasal inhaler Place 2 sprays into the nose daily. Patient taking differently: Place 2 sprays into the nose at bedtime. 03/28/20   [name], MD  vitamin B-12 (CYANOCOBALAMIN) 1000 MCG tablet Take 1,000 mcg by mouth daily.     [provider]    Allergies Diclofenac sodium and Pioglitazone  Family History  Problem Relation Age of Onset   COPD Brother    Cancer Sister        brain tumor with hemmorhage   Heart disease Sister        CAD    Social History Social History   Tobacco Use   Smoking status: Never   Smokeless tobacco: Never  Substance Use Topics   Alcohol use: No    Alcohol/week: 0.0 standard drinks   Drug use: No    Review of Systems  Review of Systems  Constitutional:  Positive for diaphoresis and malaise/fatigue. Negative for chills and fever.  HENT:  Negative for sore throat.   Eyes:  Negative for pain.  Respiratory:  Negative for cough and stridor.    Cardiovascular:  Negative for chest pain.  Gastrointestinal:  Positive for nausea. Negative for vomiting.  Genitourinary:  Negative for dysuria.  Musculoskeletal:  Negative for myalgias.  Skin:  Negative for rash.  Neurological:  Positive for weakness. Negative for seizures, loss of consciousness and headaches.  Psychiatric/Behavioral:  Negative for suicidal ideas.   All other systems reviewed and are negative.    ____________________________________________   PHYSICAL EXAM:  VITAL SIGNS: ED Triage Vitals  Enc Vitals Group     BP 05/24/21 1459 119/68     Pulse Rate 05/24/21 1459 60     Resp 05/24/21 1459 18     Temp 05/24/21 1459 97.8 F (36.6 C)     Temp Source 05/24/21 1459 Oral     SpO2 05/24/21 1459 100 %     Weight 05/24/21 1500 170 lb (77.1 kg)     Height 05/24/21 1500 _0  (1.6 m)     Head Circumference --      Peak Flow --      Pain Score 05/24/21 1500 0     Pain Loc --      Pain Edu? --      Excl. in [place]? --    Vitals:   05/24/21 1945 05/24/21 2000  BP:  (!) 134/92  Pulse: (!) 59 (!) 57  Resp:  18  Temp:    SpO2: 98% 96%   Physical Exam Vitals and nursing note reviewed.  Constitutional:      General: She is not in acute distress.    Appearance: She is well-developed. She is obese.  HENT:     Head: Normocephalic and atraumatic.     Right Ear: External ear normal.     Left Ear: External ear normal.     Nose: Nose normal.     Mouth/Throat:     Mouth: Mucous membranes are dry.  Eyes:     Conjunctiva/sclera: Conjunctivae normal.  Cardiovascular:     Rate and Rhythm: Normal rate and regular rhythm.     Heart sounds: No murmur heard. Pulmonary:     Effort: Pulmonary effort is normal. No respiratory distress.  Breath sounds: Normal breath sounds.  Abdominal:     Palpations: Abdomen is soft.     Tenderness: There is no abdominal tenderness.  Musculoskeletal:     Cervical back: Neck supple.  Skin:    General: Skin is warm and dry.     Capillary  Refill: Capillary refill takes 2 to 3 seconds.  Neurological:     Mental Status: She is alert and oriented to person, place, and time.     Cranial Nerves: Facial asymmetry present.  Psychiatric:        Mood and Affect: Mood normal.    Patient is weaker in her left arm upper and lower extremities compared to the right.  She has full strength in her right upper and lower extremities.  This is baseline per patient and daughter.  Sensation is intact to light touch all extremities.  The exception of the right-sided facial droop cranial nerves II through XII are grossly intact. ____________________________________________   LABS (all labs ordered are listed, but only abnormal results are displayed)  Labs Reviewed  BASIC METABOLIC PANEL - Abnormal; Notable for the following components:      Result Value   Sodium 134 (*)    Glucose, Bld 265 (*)    BUN 25 (*)    Creatinine, Ser 1.96 (*)    Calcium 8.8 (*)    GFR, Estimated 24 (*)    All other components within normal limits  URINALYSIS, COMPLETE (UACMP) WITH MICROSCOPIC - Abnormal; Notable for the following components:   Color, Urine AMBER (*)    APPearance HAZY (*)    Specific Gravity, Urine 1.031 (*)    Ketones, ur 5 (*)    Protein, ur 100 (*)    All other components within normal limits  HEPATIC FUNCTION PANEL - Abnormal; Notable for the following components:   Total Protein 5.7 (*)    AST 14 (*)    All other components within normal limits  CBG MONITORING, ED - Abnormal; Notable for the following components:   Glucose-Capillary 244 (*)    All other components within normal limits  CBG MONITORING, ED - Abnormal; Notable for the following components:   Glucose-Capillary 184 (*)    All other components within normal limits  RESP PANEL BY RT-PCR (FLU A&B, COVID) ARPGX2  CBC  MAGNESIUM  TSH  HEMOGLOBIN A1C  TROPONIN I (HIGH SENSITIVITY)   ____________________________________________  EKG  ECG shows sinus rhythm with rate of  62 with right superior axis deviation, nonspecific changes versus artifact in lead I lead III and aVL with some nonspecific ST changes noted in lead II without other clearance of acute ischemia or significant arrhythmia. ____________________________________________  RADIOLOGY  ED MD interpretation:    Official radiology report(s): No results found.  ____________________________________________   PROCEDURES  Procedure(s) performed (including Critical Care):  .1-3 Lead EKG Interpretation Performed by: Lucrezia Starch, MD Authorized by: Lucrezia Starch, MD     Interpretation: non-specific     ECG rate assessment: normal     Rhythm: sinus rhythm     Ectopy: none     ____________________________________________   INITIAL IMPRESSION / ASSESSMENT AND PLAN / ED COURSE      Patient presents with above-stated history and exam accompanied by daughter for assessment of any sickle cell episode earlier today with some diaphoresis and nausea in the setting of very poor intake over the last couple days having recently completed Bactrim for UTI.  Patient states she does not have any burning  or any pain has not actually lost consciousness or fallen.  She states she just feels very weak.  On arrival she is afebrile and hemodynamically stable.  She has evidence of chronic facial weakness and some weakness from prior strokes but no acute weakness.  She does appear dehydrated.  Differential includes possible vasovagal presyncopal, metabolic derangements, arrhythmia, ACS, ongoing UTI and anemia.  No history of preceding trauma and she has no new deficits on history or exam to suggest CVA.  No headache to suggest SAH.  She does not appear septic or meningitic.  BMP remarkable for evidence of mild AKI with creatinine of 1.96 compared to 1.382 months ago.  Glucose is 265 there is no evidence of acidosis or other significant electrolyte or metabolic derangements.  CBC shows no leukocytosis or acute  anemia.  COVID influenza PCR is negative.  TSH is within normal limits.  Urine does not appear infected at this time.  There are some ketones which support concern for some dehydration.  Hepatic function panel has no evidence of hepatitis or cholestasis.  Troponin and EKG are are not such as of atypical presentation for ACS or significant arrhythmia.  Magnesium is unremarkable.  Patient given some IV fluids and insulin and following system states she is feeling much better and is able to tolerate p.o.  Impression is some mild dehydration of unclear source although patient states she has not had much of an appetite.  Given she is tolerating p.o. discussed at length portance of adequate hydration having her kidney function rechecked in a couple days.  Do not believe she requires additional emergent work-up at this point I think she is stable for discharge with continued outpatient evaluation.  Also advised her to have her glucose rechecked as it was elevated today.  She is amenable with plan.  Daughter will assist with this.  Discharged stable condition.  Strict return precautions advised and discussed.     ____________________________________________   FINAL CLINICAL IMPRESSION(S) / ED DIAGNOSES  Final diagnoses:  AKI (acute kidney injury) ([place])  Hyperglycemia  Dehydration    Medications  insulin aspart (novoLOG) injection 0-15 Units (3 Units Subcutaneous Given 05/24/21 2030)  lactated ringers bolus 1,000 mL (0 mLs Intravenous Stopped 05/24/21 2012)     ED Discharge Orders     None        Note:  This document was prepared using Dragon voice recognition software and may include unintentional dictation errors.    Lucrezia Starch, MD 05/24/21 2102

## 2021-05-25 ENCOUNTER — Telehealth: Payer: Self-pay | Admitting: *Deleted

## 2021-05-25 LAB — HEMOGLOBIN A1C
Hgb A1c MFr Bld: 8.3 % — ABNORMAL HIGH (ref 4.8–5.6)
Mean Plasma Glucose: 192 mg/dL

## 2021-05-25 NOTE — Telephone Encounter (Signed)
Received call from [place], [facility].   Reports that patient was seen in ER last night due to lethargy and malaise. Reports that AKI noted, but CBC WNL. No elevation in WBC, no UTI noted. States that patient was given fluids and sent home.  Reports that patient noted to be worse today and having vomiting. States that she is not able to keep fluids down.   Advised to return to ER for evaluation.   Of note, appointment scheduled for 05/26/2021. Will cancel appointment if patient is kept in hospital.

## 2021-05-26 ENCOUNTER — Other Ambulatory Visit: Payer: Self-pay

## 2021-05-26 ENCOUNTER — Encounter: Payer: Self-pay | Admitting: Family Medicine

## 2021-05-26 ENCOUNTER — Ambulatory Visit (INDEPENDENT_AMBULATORY_CARE_PROVIDER_SITE_OTHER): Payer: Medicare HMO | Admitting: Family Medicine

## 2021-05-26 VITALS — BP 118/60 | HR 54 | Temp 98.3°F

## 2021-05-26 DIAGNOSIS — E860 Dehydration: Secondary | ICD-10-CM | POA: Diagnosis not present

## 2021-05-26 DIAGNOSIS — Z23 Encounter for immunization: Secondary | ICD-10-CM

## 2021-05-26 DIAGNOSIS — E1165 Type 2 diabetes mellitus with hyperglycemia: Secondary | ICD-10-CM | POA: Diagnosis not present

## 2021-05-26 DIAGNOSIS — N390 Urinary tract infection, site not specified: Secondary | ICD-10-CM | POA: Diagnosis not present

## 2021-05-26 LAB — BASIC METABOLIC PANEL WITH GFR
BUN/Creatinine Ratio: 13 (calc) (ref 6–22)
BUN: 23 mg/dL (ref 7–25)
CO2: 26 mmol/L (ref 20–32)
Calcium: 9.2 mg/dL (ref 8.6–10.4)
Chloride: 103 mmol/L (ref 98–110)
Creat: 1.77 mg/dL — ABNORMAL HIGH (ref 0.60–0.95)
Glucose, Bld: 238 mg/dL — ABNORMAL HIGH (ref 65–99)
Potassium: 4.4 mmol/L (ref 3.5–5.3)
Sodium: 136 mmol/L (ref 135–146)
eGFR: 27 mL/min/{1.73_m2} — ABNORMAL LOW (ref >=60)

## 2021-05-26 MED ORDER — PIOGLITAZONE HCL 15 MG PO TABS: 15.0000 mg | ORAL_TABLET | Freq: Every day | ORAL | 5 refills | Status: DC

## 2021-05-26 MED ORDER — MIRTAZAPINE 15 MG PO TABS: 15.0000 mg | ORAL_TABLET | Freq: Every day | ORAL | 3 refills | Status: DC

## 2021-05-26 NOTE — Progress Notes (Signed)
Subjective:    Patient ID: Renee [name], female    DOB: 03-Jul-1929, 87 y.o.   MRN: [id number] I recently treated the patient empirically for urinary tract infection with Bactrim.  Urine culture confirmed E. coli sensitive to Bactrim.  However unfortunately, the patient had to go the emergency room on Wednesday due to weakness and fatigue.  Urinalysis and work-up in the emergency room showed a creatinine that increased from 1.3-1.96 indicating dehydration.  She also had an elevated hemoglobin A1c of 8.3.  She was given IV fluids.  She states that she feels much better today and she is back to her baseline.  She denies any nausea or vomiting.  She denies any fevers or chills.  She denies any chest pain or shortness of breath. Past Medical History:  Diagnosis Date   Angioedema    possibly from voltaren   Bronchopneumonia 12/11/2016   Degenerative disc disease    Diabetes mellitus    type II   Hyperlipidemia    Hypertension    LVH (left ventricular hypertrophy)    and atrial enlargement by echo in past with nl EF   Nasal pruritis    Osteoarthritis    Osteopenia    Renal insufficiency    Sleep apnea    Stroke ([place]) 05/2010   Small vessel sobcortical (in [place] trial) with Dr Leonie Man, residual L hemiparesis   Vitamin B 12 deficiency 04/08   Past Surgical History:  Procedure Laterality Date   ABDOMINAL HYSTERECTOMY     BSO-fibroids   APPENDECTOMY     BACK SURGERY     COLON SURGERY     due to punctured intestines   EYE SURGERY     cataract extraction   IR THORACENTESIS ASP PLEURAL SPACE W/IMG GUIDE  06/07/2020   KNEE SURGERY     arthroscope   PARS PLANA VITRECTOMY  07/31/2011   Procedure: PARS PLANA VITRECTOMY WITH 25 GAUGE;  Surgeon: [name], MD;  Location: [facility];  Service: Ophthalmology;  Laterality: Right;  REMOVAL OF SILICONE OIL AND LASER RIGHT EYE   RETINAL DETACHMENT SURGERY  02/18/11   times 2   SPINE SURGERY  08/09   spinal decompression surgery   Current  Outpatient Medications on File Prior to Visit  Medication Sig Dispense Refill   ALPRAZolam (XANAX) 0.5 MG tablet TAKE 1/2 TO 1 TABLET BY MOUTH TWICE A DAY AS NEEDED FOR ANXIETY 60 tablet 2   amLODipine (NORVASC) 10 MG tablet Take 1 tablet (10 mg total) by mouth daily. 90 tablet 3   cephALEXin (KEFLEX) 500 MG capsule TAKE 1 CAPSULE (500 MG TOTAL) BY MOUTH DAILY. 30 capsule 3   Cholecalciferol (VITAMIN D) 1000 UNITS capsule Take 1,000 Units by mouth daily.     glipiZIDE (GLIPIZIDE XL) 10 MG 24 hr tablet Take 1 tablet (10 mg total) by mouth daily with breakfast. 90 tablet 3   hydrALAZINE (APRESOLINE) 100 MG tablet Take 1 tablet (100 mg total) by mouth 3 (three) times daily. 270 tablet 3   metoprolol succinate (TOPROL-XL) 25 MG 24 hr tablet Take 0.5 tablets (12.5 mg total) by mouth 2 (two) times daily. 90 tablet 3   sertraline (ZOLOFT) 50 MG tablet TAKE 1 TABLET BY MOUTH EVERY DAY 90 tablet 1   sitaGLIPtin (JANUVIA) 100 MG tablet Take 1 tablet (100 mg total) by mouth daily. 90 tablet 3   trandolapril (MAVIK) 4 MG tablet Take 1 tablet (4 mg total) by mouth daily. 90 tablet 3  vitamin B-12 (CYANOCOBALAMIN) 1000 MCG tablet Take 1,000 mcg by mouth daily.      albuterol (VENTOLIN HFA) 108 (90 Base) MCG/ACT inhaler INHALE 2 PUFFS INTO THE LUNGS EVERY 4 HOURS AS NEEDED FOR WHEEZING/SHORTNESS OF BREATH 18 each 3   Blood Glucose Monitoring Suppl (ONE TOUCH ULTRA 2) w/Device KIT Check blood sugar once daily and as directed. Dx E11.9 1 each 0   calcium-vitamin D (OSCAL WITH D) 500-200 MG-UNIT per tablet Take 1 tablet by mouth daily.     Cranberry 500 MG TABS Take 500 mg by mouth in the morning and at bedtime.     docusate sodium (COLACE) 100 MG capsule Take 200 mg by mouth at bedtime.     Multiple Vitamin (MULTIVITAMIN) capsule Take 1 capsule by mouth daily.     Multiple Vitamins-Minerals (PRESERVISION AREDS 2+MULTI VIT) CAPS Take 1 capsule by mouth in the morning and at bedtime.     Olopatadine HCl (PATADAY  OP) Place 1 drop into both eyes daily.     OneTouch Delica Lancets 91T MISC CHECK BLOOD SUGAR ONCE DAILY AND AS DIRECTED 100 each 2   ONETOUCH ULTRA test strip CHECK BLOOD SUGAR ONCE DAILY AND AS DIRECTED. DX E11.9 100 strip 1   triamcinolone (NASACORT) 55 MCG/ACT AERO nasal inhaler Place 2 sprays into the nose daily. (Patient taking differently: Place 2 sprays into the nose at bedtime.) 1 each 5   No current facility-administered medications on file prior to visit.   Allergies  Allergen Reactions   Diclofenac Sodium Other (See Comments)    REACTION: angioedema   Pioglitazone Swelling   Social History   Socioeconomic History   Marital status: Widowed    Spouse name: Not on file   Number of children: Not on file   Years of education: Not on file   Highest education level: Not on file  Occupational History   Not on file  Tobacco Use   Smoking status: Never   Smokeless tobacco: Never  Vaping Use   Vaping Use: Not on file  Substance and Sexual Activity   Alcohol use: No    Alcohol/week: 0.0 standard drinks   Drug use: No   Sexual activity: Never  Other Topics Concern   Not on file  Social History Narrative   Not on file   Social Determinants of Health   Financial Resource Strain: Low Risk    Difficulty of Paying Living Expenses: Not hard at all  Food Insecurity: No Food Insecurity   Worried About Charity fundraiser in the Last Year: Never true   [place] in the Last Year: Never true  Transportation Needs: No Transportation Needs   Lack of Transportation (Medical): No   Lack of Transportation (Non-Medical): No  Physical Activity: Inactive   Days of Exercise per Week: 0 days   Minutes of Exercise per Session: 0 min  Stress: No Stress Concern Present   Feeling of Stress : Not at all  Social Connections: Socially Isolated   Frequency of Communication with Friends and Family: More than three times a week   Frequency of Social Gatherings with Friends and Family:  Once a week   Attends Religious Services: Never   Marine scientist or Organizations: No   Attends Archivist Meetings: Never   Marital Status: Widowed  Intimate Partner Violence: Not At Risk   Fear of Current or Ex-Partner: No   Emotionally Abused: No   Physically Abused: No   Sexually  Abused: No      Review of Systems  Genitourinary:  Positive for frequency.  Musculoskeletal:  Positive for back pain.  All other systems reviewed and are negative.     Objective:   Physical Exam Vitals reviewed.  Constitutional:      General: She is not in acute distress.    Appearance: Normal appearance. She is obese. She is not ill-appearing, toxic-appearing or diaphoretic.  HENT:     Right Ear: Tympanic membrane and ear canal normal.     Left Ear: Tympanic membrane and ear canal normal.     Nose: Nose normal. No congestion or rhinorrhea.  Cardiovascular:     Rate and Rhythm: Normal rate and regular rhythm.     Heart sounds: Normal heart sounds. No murmur heard.   No friction rub. No gallop.  Pulmonary:     Effort: Pulmonary effort is normal. No respiratory distress.     Breath sounds: Normal breath sounds. No stridor. No wheezing, rhonchi or rales.  Musculoskeletal:     Right lower leg: No edema.     Left lower leg: No edema.  Neurological:     Mental Status: She is alert and oriented to person, place, and time. Mental status is at baseline.     Sensory: No sensory deficit.     Motor: Weakness present.     Coordination: Coordination abnormal.     Gait: Gait abnormal.  Chronic left facial droop due to her previous remote right basal ganglial infarct        Assessment & Plan:  Dehydration - Plan: BASIC METABOLIC PANEL WITH GFR  Type 2 diabetes mellitus with hyperglycemia, without long-term current use of insulin (HCC)  Frequent UTI She appears euvolemic today.  I will repeat a BMP to ensure resolution of the dehydration.  I encouraged the patient to try to  drink more fluid.  Because the A1c was 8.3, I will add Actos 15 mg a day to her glipizide and her Januvia.  Renal function prevents her from using metformin and other options are too expensive for the patient.  Lastly I feel the urinary tract infection has resolved.  She denies any dysuria or urgency or frequency today

## 2021-05-29 ENCOUNTER — Ambulatory Visit: Payer: Medicare HMO | Admitting: Family Medicine

## 2021-05-30 DIAGNOSIS — E785 Hyperlipidemia, unspecified: Secondary | ICD-10-CM | POA: Diagnosis not present

## 2021-05-30 DIAGNOSIS — E11319 Type 2 diabetes mellitus with unspecified diabetic retinopathy without macular edema: Secondary | ICD-10-CM | POA: Diagnosis not present

## 2021-05-30 DIAGNOSIS — E1165 Type 2 diabetes mellitus with hyperglycemia: Secondary | ICD-10-CM | POA: Diagnosis not present

## 2021-05-30 DIAGNOSIS — I700 Atherosclerosis of aorta: Secondary | ICD-10-CM | POA: Diagnosis not present

## 2021-05-30 DIAGNOSIS — E1122 Type 2 diabetes mellitus with diabetic chronic kidney disease: Secondary | ICD-10-CM | POA: Diagnosis not present

## 2021-05-30 DIAGNOSIS — E1169 Type 2 diabetes mellitus with other specified complication: Secondary | ICD-10-CM | POA: Diagnosis not present

## 2021-05-30 DIAGNOSIS — D631 Anemia in chronic kidney disease: Secondary | ICD-10-CM | POA: Diagnosis not present

## 2021-05-30 DIAGNOSIS — R69 Illness, unspecified: Secondary | ICD-10-CM | POA: Diagnosis not present

## 2021-05-30 DIAGNOSIS — N184 Chronic kidney disease, stage 4 (severe): Secondary | ICD-10-CM | POA: Diagnosis not present

## 2021-05-30 DIAGNOSIS — I1310 Hypertensive heart and chronic kidney disease without heart failure, with stage 1 through stage 4 chronic kidney disease, or unspecified chronic kidney disease: Secondary | ICD-10-CM | POA: Diagnosis not present

## 2021-05-31 ENCOUNTER — Other Ambulatory Visit: Payer: Self-pay | Admitting: Pulmonary Disease

## 2021-06-01 DIAGNOSIS — N184 Chronic kidney disease, stage 4 (severe): Secondary | ICD-10-CM | POA: Diagnosis not present

## 2021-06-01 DIAGNOSIS — I1310 Hypertensive heart and chronic kidney disease without heart failure, with stage 1 through stage 4 chronic kidney disease, or unspecified chronic kidney disease: Secondary | ICD-10-CM | POA: Diagnosis not present

## 2021-06-01 DIAGNOSIS — D631 Anemia in chronic kidney disease: Secondary | ICD-10-CM | POA: Diagnosis not present

## 2021-06-01 DIAGNOSIS — E1165 Type 2 diabetes mellitus with hyperglycemia: Secondary | ICD-10-CM | POA: Diagnosis not present

## 2021-06-01 DIAGNOSIS — E1169 Type 2 diabetes mellitus with other specified complication: Secondary | ICD-10-CM | POA: Diagnosis not present

## 2021-06-01 DIAGNOSIS — E785 Hyperlipidemia, unspecified: Secondary | ICD-10-CM | POA: Diagnosis not present

## 2021-06-01 DIAGNOSIS — I700 Atherosclerosis of aorta: Secondary | ICD-10-CM | POA: Diagnosis not present

## 2021-06-01 DIAGNOSIS — E1122 Type 2 diabetes mellitus with diabetic chronic kidney disease: Secondary | ICD-10-CM | POA: Diagnosis not present

## 2021-06-01 DIAGNOSIS — E11319 Type 2 diabetes mellitus with unspecified diabetic retinopathy without macular edema: Secondary | ICD-10-CM | POA: Diagnosis not present

## 2021-06-01 DIAGNOSIS — R69 Illness, unspecified: Secondary | ICD-10-CM | POA: Diagnosis not present

## 2021-06-06 DIAGNOSIS — E1169 Type 2 diabetes mellitus with other specified complication: Secondary | ICD-10-CM | POA: Diagnosis not present

## 2021-06-06 DIAGNOSIS — I700 Atherosclerosis of aorta: Secondary | ICD-10-CM | POA: Diagnosis not present

## 2021-06-06 DIAGNOSIS — E1165 Type 2 diabetes mellitus with hyperglycemia: Secondary | ICD-10-CM | POA: Diagnosis not present

## 2021-06-06 DIAGNOSIS — E785 Hyperlipidemia, unspecified: Secondary | ICD-10-CM | POA: Diagnosis not present

## 2021-06-06 DIAGNOSIS — R69 Illness, unspecified: Secondary | ICD-10-CM | POA: Diagnosis not present

## 2021-06-06 DIAGNOSIS — E1122 Type 2 diabetes mellitus with diabetic chronic kidney disease: Secondary | ICD-10-CM | POA: Diagnosis not present

## 2021-06-06 DIAGNOSIS — N184 Chronic kidney disease, stage 4 (severe): Secondary | ICD-10-CM | POA: Diagnosis not present

## 2021-06-06 DIAGNOSIS — D631 Anemia in chronic kidney disease: Secondary | ICD-10-CM | POA: Diagnosis not present

## 2021-06-06 DIAGNOSIS — E11319 Type 2 diabetes mellitus with unspecified diabetic retinopathy without macular edema: Secondary | ICD-10-CM | POA: Diagnosis not present

## 2021-06-06 DIAGNOSIS — I1310 Hypertensive heart and chronic kidney disease without heart failure, with stage 1 through stage 4 chronic kidney disease, or unspecified chronic kidney disease: Secondary | ICD-10-CM | POA: Diagnosis not present

## 2021-06-07 DIAGNOSIS — I1310 Hypertensive heart and chronic kidney disease without heart failure, with stage 1 through stage 4 chronic kidney disease, or unspecified chronic kidney disease: Secondary | ICD-10-CM | POA: Diagnosis not present

## 2021-06-07 DIAGNOSIS — R69 Illness, unspecified: Secondary | ICD-10-CM | POA: Diagnosis not present

## 2021-06-07 DIAGNOSIS — E1169 Type 2 diabetes mellitus with other specified complication: Secondary | ICD-10-CM | POA: Diagnosis not present

## 2021-06-07 DIAGNOSIS — N184 Chronic kidney disease, stage 4 (severe): Secondary | ICD-10-CM | POA: Diagnosis not present

## 2021-06-07 DIAGNOSIS — E11319 Type 2 diabetes mellitus with unspecified diabetic retinopathy without macular edema: Secondary | ICD-10-CM | POA: Diagnosis not present

## 2021-06-07 DIAGNOSIS — E1122 Type 2 diabetes mellitus with diabetic chronic kidney disease: Secondary | ICD-10-CM | POA: Diagnosis not present

## 2021-06-07 DIAGNOSIS — E785 Hyperlipidemia, unspecified: Secondary | ICD-10-CM | POA: Diagnosis not present

## 2021-06-07 DIAGNOSIS — D631 Anemia in chronic kidney disease: Secondary | ICD-10-CM | POA: Diagnosis not present

## 2021-06-07 DIAGNOSIS — E1165 Type 2 diabetes mellitus with hyperglycemia: Secondary | ICD-10-CM | POA: Diagnosis not present

## 2021-06-07 DIAGNOSIS — I700 Atherosclerosis of aorta: Secondary | ICD-10-CM | POA: Diagnosis not present

## 2021-06-09 DIAGNOSIS — E785 Hyperlipidemia, unspecified: Secondary | ICD-10-CM | POA: Diagnosis not present

## 2021-06-09 DIAGNOSIS — F32A Depression, unspecified: Secondary | ICD-10-CM

## 2021-06-09 DIAGNOSIS — N184 Chronic kidney disease, stage 4 (severe): Secondary | ICD-10-CM | POA: Diagnosis not present

## 2021-06-09 DIAGNOSIS — R69 Illness, unspecified: Secondary | ICD-10-CM | POA: Diagnosis not present

## 2021-06-09 DIAGNOSIS — E1165 Type 2 diabetes mellitus with hyperglycemia: Secondary | ICD-10-CM | POA: Diagnosis not present

## 2021-06-09 DIAGNOSIS — E1169 Type 2 diabetes mellitus with other specified complication: Secondary | ICD-10-CM | POA: Diagnosis not present

## 2021-06-09 DIAGNOSIS — E1122 Type 2 diabetes mellitus with diabetic chronic kidney disease: Secondary | ICD-10-CM | POA: Diagnosis not present

## 2021-06-09 DIAGNOSIS — I1310 Hypertensive heart and chronic kidney disease without heart failure, with stage 1 through stage 4 chronic kidney disease, or unspecified chronic kidney disease: Secondary | ICD-10-CM | POA: Diagnosis not present

## 2021-06-11 IMAGING — CT CT HEAD W/O CM
3 series · 15 of 47 positions shown, 18 images · non-contrast
Comparison: October 31, 2010

CLINICAL DATA: Dizziness.

EXAM:
CT HEAD WITHOUT CONTRAST
TECHNIQUE: Contiguous axial images were obtained from the base of the skull
through the vertex without intravenous contrast.

[Series 3: head 5.0 h30s · axial · 0.43mm/px · z∈[-123,+7]mm · 9 of 32 slices shown, 12 images]
[im 3/32  brain]
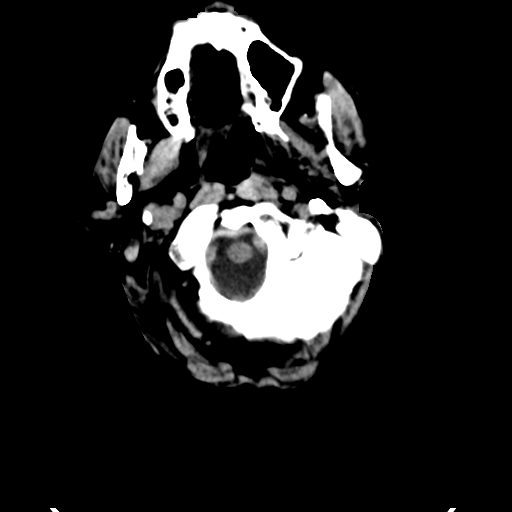
[im 3/32  bone]
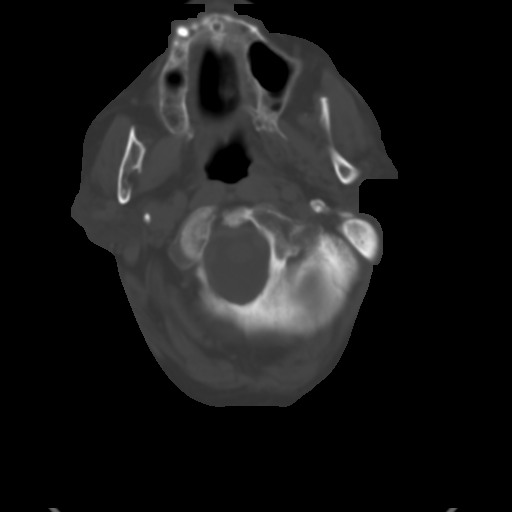
[im 6/32  brain]
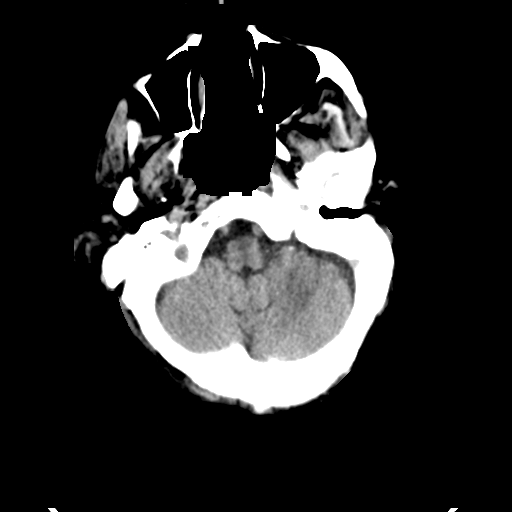
[im 9/32  brain]
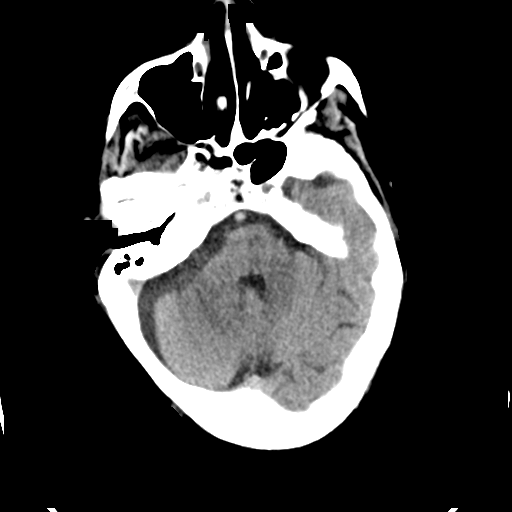
[im 12/32  brain]
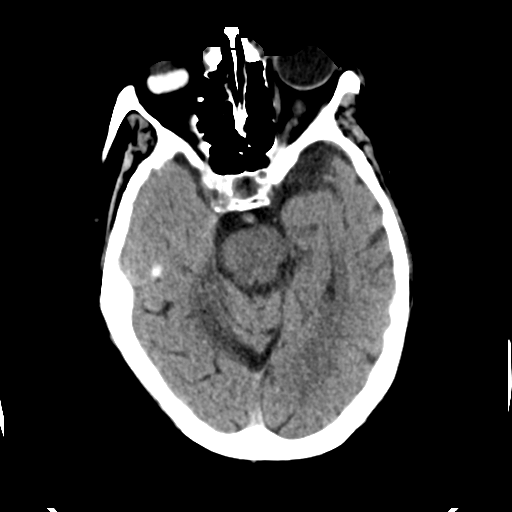
[im 17/32  brain]
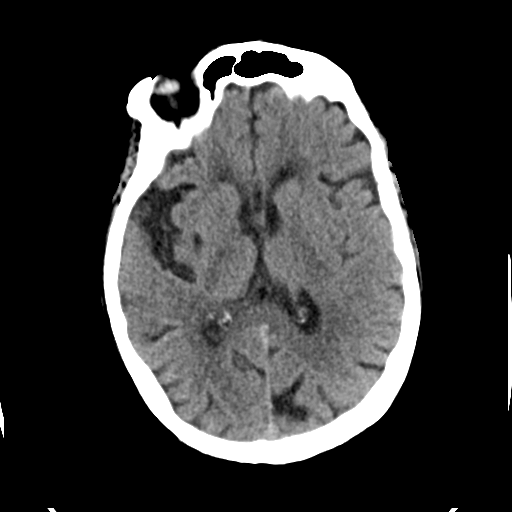
[im 17/32  bone]
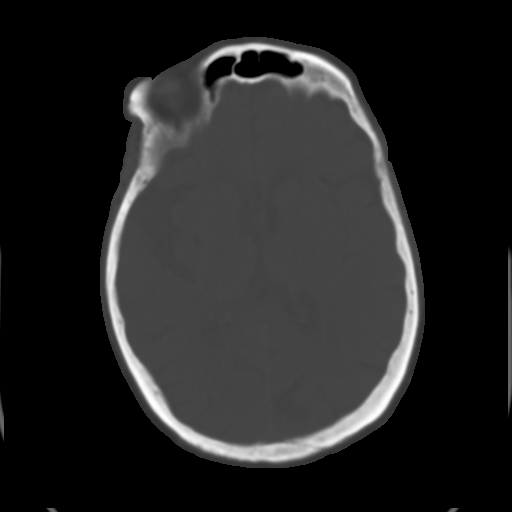
[im 20/32  brain]
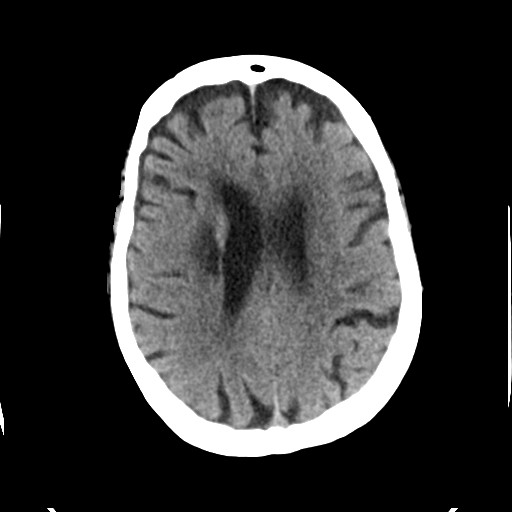
[im 23/32  brain]
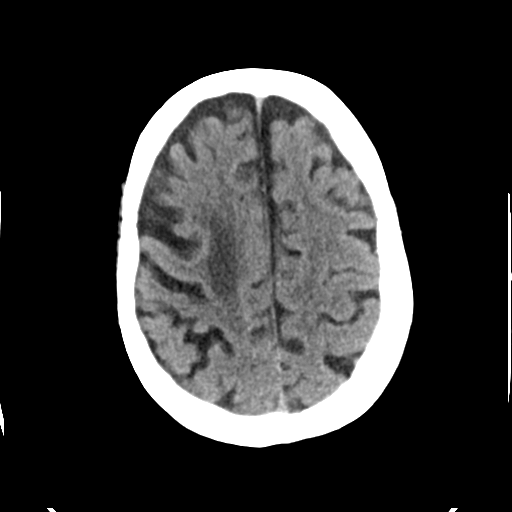
[im 26/32  brain]
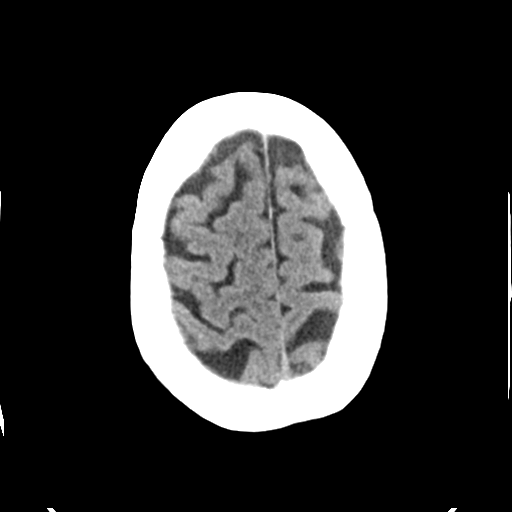
[im 29/32  brain]
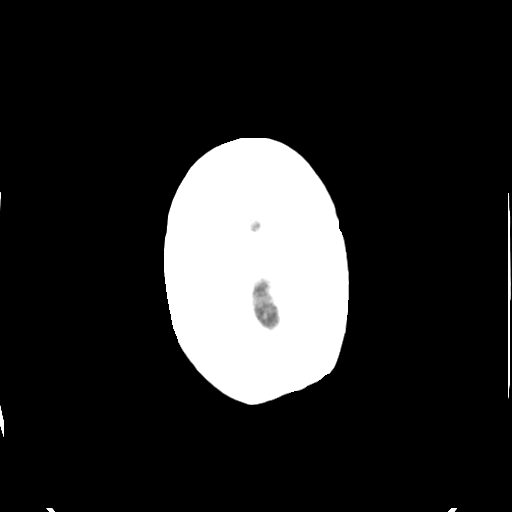
[im 29/32  bone]
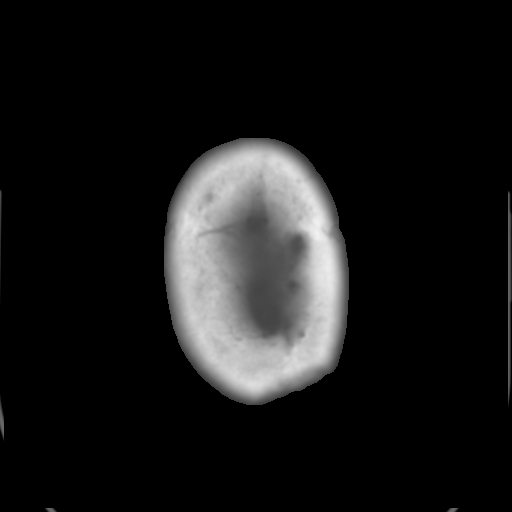

[Series 5: head 3.0 mpr cor · coronal · 0.31mm/px · 3 of 72 slices shown]
[im 24/72  brain]
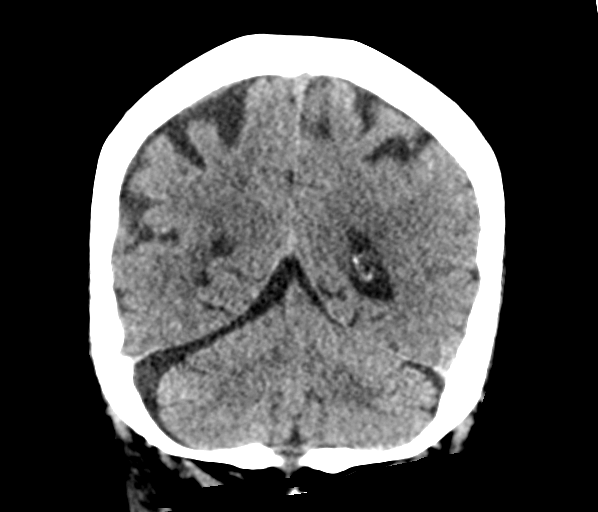
[im 32/72  brain]
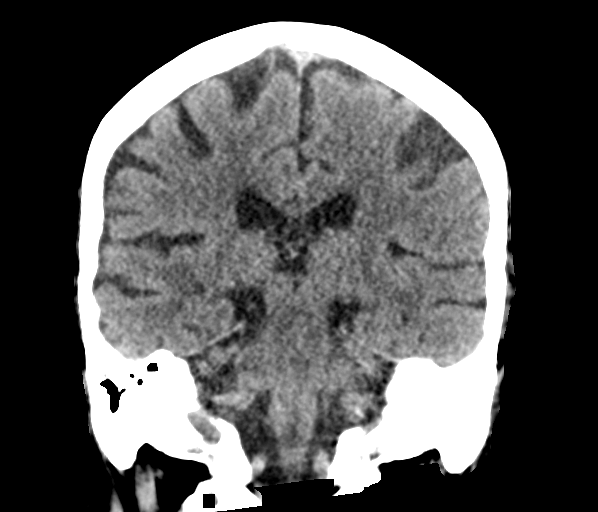
[im 40/72  brain]
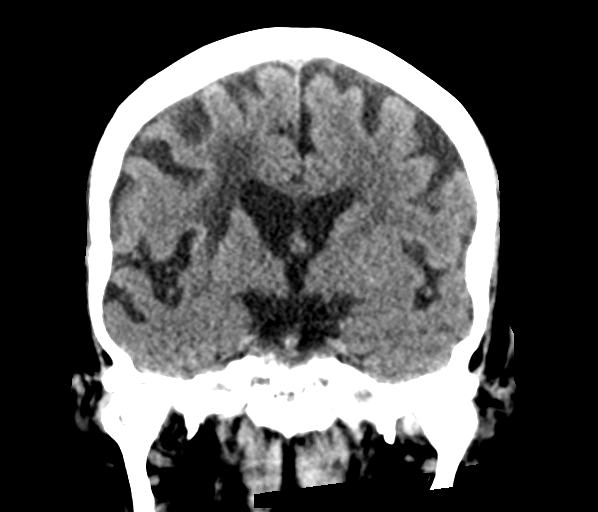

[Series 6: head 3.0 mpr sag · sagittal · 0.31mm/px · 3 of 54 slices shown]
[im 20/54  brain]
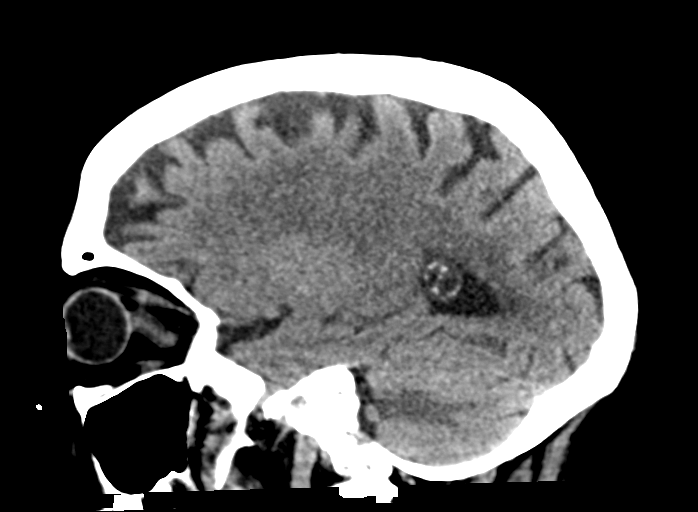
[im 27/54  brain]
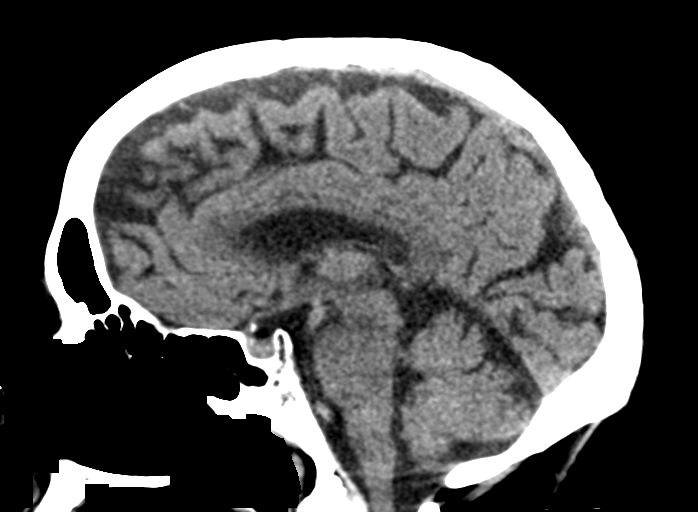
[im 34/54  brain]
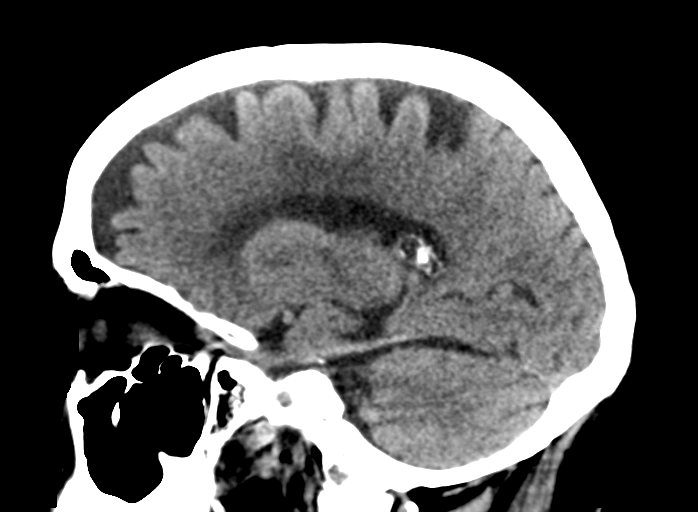

[15 of 47 positions shown; findings below may reference images not displayed]

FINDINGS: Brain: There is mild to moderate severity cerebral atrophy with
widening of the extra-axial spaces and ventricular dilatation.
There are areas of decreased attenuation within the white matter
tracts of the supratentorial brain, consistent with microvascular
disease changes.

A chronic right basal ganglia lacunar infarct is seen. This is
present on the prior study.

Vascular: No hyperdense vessel or unexpected calcification.

Skull: Normal. Negative for fracture or focal lesion.

Sinuses/Orbits: No acute finding. Evidence of prior scleral banding
is noted on the right.

Other: None.
IMPRESSION: 1. Generalized cerebral atrophy.
2. Chronic right basal ganglia lacunar infarct.
3. No acute intracranial abnormality.

## 2021-06-13 DIAGNOSIS — E11319 Type 2 diabetes mellitus with unspecified diabetic retinopathy without macular edema: Secondary | ICD-10-CM | POA: Diagnosis not present

## 2021-06-13 DIAGNOSIS — I700 Atherosclerosis of aorta: Secondary | ICD-10-CM | POA: Diagnosis not present

## 2021-06-13 DIAGNOSIS — N184 Chronic kidney disease, stage 4 (severe): Secondary | ICD-10-CM | POA: Diagnosis not present

## 2021-06-13 DIAGNOSIS — I1310 Hypertensive heart and chronic kidney disease without heart failure, with stage 1 through stage 4 chronic kidney disease, or unspecified chronic kidney disease: Secondary | ICD-10-CM | POA: Diagnosis not present

## 2021-06-13 DIAGNOSIS — E1122 Type 2 diabetes mellitus with diabetic chronic kidney disease: Secondary | ICD-10-CM | POA: Diagnosis not present

## 2021-06-13 DIAGNOSIS — E1169 Type 2 diabetes mellitus with other specified complication: Secondary | ICD-10-CM | POA: Diagnosis not present

## 2021-06-13 DIAGNOSIS — E785 Hyperlipidemia, unspecified: Secondary | ICD-10-CM | POA: Diagnosis not present

## 2021-06-13 DIAGNOSIS — E1165 Type 2 diabetes mellitus with hyperglycemia: Secondary | ICD-10-CM | POA: Diagnosis not present

## 2021-06-13 DIAGNOSIS — D631 Anemia in chronic kidney disease: Secondary | ICD-10-CM | POA: Diagnosis not present

## 2021-06-13 DIAGNOSIS — R69 Illness, unspecified: Secondary | ICD-10-CM | POA: Diagnosis not present

## 2021-06-16 DIAGNOSIS — G4700 Insomnia, unspecified: Secondary | ICD-10-CM | POA: Diagnosis not present

## 2021-06-16 DIAGNOSIS — H3530 Unspecified macular degeneration: Secondary | ICD-10-CM | POA: Diagnosis not present

## 2021-06-16 DIAGNOSIS — M1990 Unspecified osteoarthritis, unspecified site: Secondary | ICD-10-CM | POA: Diagnosis not present

## 2021-06-16 DIAGNOSIS — K5900 Constipation, unspecified: Secondary | ICD-10-CM | POA: Diagnosis not present

## 2021-06-16 DIAGNOSIS — G8929 Other chronic pain: Secondary | ICD-10-CM | POA: Diagnosis not present

## 2021-06-16 DIAGNOSIS — E1165 Type 2 diabetes mellitus with hyperglycemia: Secondary | ICD-10-CM | POA: Diagnosis not present

## 2021-06-16 DIAGNOSIS — I10 Essential (primary) hypertension: Secondary | ICD-10-CM | POA: Diagnosis not present

## 2021-06-16 DIAGNOSIS — R69 Illness, unspecified: Secondary | ICD-10-CM | POA: Diagnosis not present

## 2021-06-16 DIAGNOSIS — I69354 Hemiplegia and hemiparesis following cerebral infarction affecting left non-dominant side: Secondary | ICD-10-CM | POA: Diagnosis not present

## 2021-06-19 ENCOUNTER — Emergency Department: Payer: Medicare HMO

## 2021-06-19 ENCOUNTER — Emergency Department
Admission: EM | Admit: 2021-06-19 | Discharge: 2021-06-20 | Disposition: A | Discharge status: Home / Self Care | Payer: Medicare HMO | Attending: Emergency Medicine | Admitting: Emergency Medicine

## 2021-06-19 ENCOUNTER — Other Ambulatory Visit: Payer: Self-pay

## 2021-06-19 ENCOUNTER — Ambulatory Visit (INDEPENDENT_AMBULATORY_CARE_PROVIDER_SITE_OTHER): Payer: Medicare HMO | Admitting: Nurse Practitioner

## 2021-06-19 VITALS — Temp 100.6°F

## 2021-06-19 DIAGNOSIS — R062 Wheezing: Secondary | ICD-10-CM | POA: Diagnosis not present

## 2021-06-19 DIAGNOSIS — Z5321 Procedure and treatment not carried out due to patient leaving prior to being seen by health care provider: Secondary | ICD-10-CM | POA: Insufficient documentation

## 2021-06-19 DIAGNOSIS — Z20822 Contact with and (suspected) exposure to covid-19: Secondary | ICD-10-CM | POA: Insufficient documentation

## 2021-06-19 DIAGNOSIS — R0981 Nasal congestion: Secondary | ICD-10-CM | POA: Diagnosis not present

## 2021-06-19 DIAGNOSIS — R059 Cough, unspecified: Secondary | ICD-10-CM | POA: Insufficient documentation

## 2021-06-19 DIAGNOSIS — Z743 Need for continuous supervision: Secondary | ICD-10-CM | POA: Diagnosis not present

## 2021-06-19 DIAGNOSIS — J80 Acute respiratory distress syndrome: Secondary | ICD-10-CM | POA: Diagnosis not present

## 2021-06-19 DIAGNOSIS — R0602 Shortness of breath: Secondary | ICD-10-CM | POA: Insufficient documentation

## 2021-06-19 DIAGNOSIS — R0902 Hypoxemia: Secondary | ICD-10-CM

## 2021-06-19 DIAGNOSIS — I959 Hypotension, unspecified: Secondary | ICD-10-CM | POA: Diagnosis not present

## 2021-06-19 DIAGNOSIS — R509 Fever, unspecified: Secondary | ICD-10-CM | POA: Insufficient documentation

## 2021-06-19 LAB — COMPREHENSIVE METABOLIC PANEL
ALT: 15 U/L (ref 0–44)
AST: 17 U/L (ref 15–41)
Albumin: 3.9 g/dL (ref 3.5–5.0)
Alkaline Phosphatase: 73 U/L (ref 38–126)
Anion gap: 5 (ref 5–15)
BUN: 27 mg/dL — ABNORMAL HIGH (ref 8–23)
CO2: 29 mmol/L (ref 22–32)
Calcium: 8.9 mg/dL (ref 8.9–10.3)
Chloride: 107 mmol/L (ref 98–111)
Creatinine, Ser: 1.3 mg/dL — ABNORMAL HIGH (ref 0.44–1.00)
GFR, Estimated: 39 mL/min — ABNORMAL LOW (ref >60)
Glucose, Bld: 197 mg/dL — ABNORMAL HIGH (ref 70–99)
Potassium: 4 mmol/L (ref 3.5–5.1)
Sodium: 141 mmol/L (ref 135–145)
Total Bilirubin: 0.8 mg/dL (ref 0.3–1.2)
Total Protein: 6.3 g/dL — ABNORMAL LOW (ref 6.5–8.1)

## 2021-06-19 LAB — TROPONIN I (HIGH SENSITIVITY)
Troponin I (High Sensitivity): 23 ng/L — ABNORMAL HIGH (ref <18)
Troponin I (High Sensitivity): 20 ng/L — ABNORMAL HIGH (ref <18)

## 2021-06-19 LAB — CBC WITH DIFFERENTIAL/PLATELET
Abs Immature Granulocytes: 0.05 10*3/uL (ref 0.00–0.07)
Basophils Absolute: 0.1 10*3/uL (ref 0.0–0.1)
Basophils Relative: 1 %
Eosinophils Absolute: 0.1 10*3/uL (ref 0.0–0.5)
Eosinophils Relative: 1 %
HCT: 37.8 % (ref 36.0–46.0)
Hemoglobin: 11.8 g/dL — ABNORMAL LOW (ref 12.0–15.0)
Immature Granulocytes: 1 %
Lymphocytes Relative: 13 %
Lymphs Abs: 0.9 10*3/uL (ref 0.7–4.0)
MCH: 28.4 pg (ref 26.0–34.0)
MCHC: 31.2 g/dL (ref 30.0–36.0)
MCV: 90.9 fL (ref 80.0–100.0)
Monocytes Absolute: 0.8 10*3/uL (ref 0.1–1.0)
Monocytes Relative: 11 %
Neutro Abs: 5.4 10*3/uL (ref 1.7–7.7)
Neutrophils Relative %: 73 %
Platelets: 152 10*3/uL (ref 150–400)
RBC: 4.16 MIL/uL (ref 3.87–5.11)
RDW: 14.3 % (ref 11.5–15.5)
WBC: 7.3 10*3/uL (ref 4.0–10.5)
nRBC: 0 % (ref 0.0–0.2)

## 2021-06-19 LAB — BRAIN NATRIURETIC PEPTIDE: B Natriuretic Peptide: 221.3 pg/mL — ABNORMAL HIGH (ref 0.0–100.0)

## 2021-06-19 MED ORDER — IPRATROPIUM-ALBUTEROL 0.5-2.5 (3) MG/3ML IN SOLN
3.0000 mL | Freq: Once | RESPIRATORY_TRACT | Status: DC
  Filled 2021-06-19: qty 3

## 2021-06-19 NOTE — ED Provider Notes (Signed)
Emergency Medicine Provider Triage Evaluation Note  SHERMEKA RUTT , a 87 y.o. female with a history of type 2 diabetes, pleural effusion and chronic kidney disease was evaluated in triage.  Pt complains of cough, fever, weakness and wheezing for 1 day.  Patient is accompanied by her sister-in-law who reports that they tried to see her PCP and were referred to the emergency department due to fever.  Patient denies current chest pain or chest tightness.  Review of Systems  Positive: Patient has shortness of breath, fever and cough Negative: No chest pain or abdominal pain.   Physical Exam  BP 134/63   Pulse 64   Temp 98.1 F (36.7 C) (Oral)   Resp (!) 24   Ht 5\' 2"  (1.575 m)   Wt 78.9 kg   SpO2 (!) 89%   BMI 31.83 kg/m  Gen:   Awake, no distress   Resp:  Normal effort. Patient has cough and wheezing.  MSK:   Moves extremities without difficulty  Other:    Medical Decision Making  Medically screening exam initiated at 5:38 PM.  Appropriate orders placed.  Lenah Messenger Suk was informed that the remainder of the evaluation will be completed by another provider, this initial triage assessment does not replace that evaluation, and the importance of remaining in the ED until their evaluation is complete.     Vallarie Mare [place], PA-C 06/19/21 1741    Duffy Bruce, MD 06/20/21 [phone]

## 2021-06-19 NOTE — ED Triage Notes (Signed)
C/O cough, fever x 1 day.  BIB [facility].  Per report T: 100.6.

## 2021-06-19 NOTE — Progress Notes (Signed)
Subjective:    Patient ID: Renee [name], female    DOB: August 15, 1929, 87 y.o.   MRN: [id number]  HPI: Renee [name] is a 87 y.o. female presenting virtually with Holly May, caregiver, for cough.  Chief Complaint  Patient presents with   Cough   UPPER RESPIRATORY TRACT INFECTION Caregiver reports oxygen saturation has been at 86% on room air today.  She does not wear or have access to oxygen at home.  The patient is complaining of shortness of breath and chest pressure. Onset: last 48 hours COVID-19 testing history: not done COVID-19 vaccination status: has had vaccines - 2 with 2 boosters Fever: yes Cough: yes; productive  Shortness of breath: yes Wheezing: yes Chest pain: yes - "heavy" Chest tightness: yes Chest congestion: yes  Allergies  Allergen Reactions   Diclofenac Sodium Other (See Comments)    REACTION: angioedema   Pioglitazone Swelling    Outpatient Encounter Medications as of 06/19/2021  Medication Sig   albuterol (VENTOLIN HFA) 108 (90 Base) MCG/ACT inhaler INHALE 2 PUFFS INTO THE LUNGS EVERY 4 HOURS AS NEEDED FOR WHEEZING/SHORTNESS OF BREATH   ALPRAZolam (XANAX) 0.5 MG tablet TAKE 1/2 TO 1 TABLET BY MOUTH TWICE A DAY AS NEEDED FOR ANXIETY   amLODipine (NORVASC) 10 MG tablet Take 1 tablet (10 mg total) by mouth daily.   Blood Glucose Monitoring Suppl (ONE TOUCH ULTRA 2) w/Device KIT Check blood sugar once daily and as directed. Dx E11.9   calcium-vitamin D (OSCAL WITH D) 500-200 MG-UNIT per tablet Take 1 tablet by mouth daily.   cephALEXin (KEFLEX) 500 MG capsule TAKE 1 CAPSULE (500 MG TOTAL) BY MOUTH DAILY.   Cholecalciferol (VITAMIN D) 1000 UNITS capsule Take 1,000 Units by mouth daily.   Cranberry 500 MG TABS Take 500 mg by mouth in the morning and at bedtime.   docusate sodium (COLACE) 100 MG capsule Take 200 mg by mouth at bedtime.   furosemide (LASIX) 20 MG tablet TAKE 1/2 TABLET BY MOUTH EVERY DAY   glipiZIDE (GLIPIZIDE XL) 10 MG 24 hr tablet Take 1  tablet (10 mg total) by mouth daily with breakfast.   hydrALAZINE (APRESOLINE) 100 MG tablet Take 1 tablet (100 mg total) by mouth 3 (three) times daily.   metoprolol succinate (TOPROL-XL) 25 MG 24 hr tablet Take 0.5 tablets (12.5 mg total) by mouth 2 (two) times daily.   mirtazapine (REMERON) 15 MG tablet Take 1 tablet (15 mg total) by mouth at bedtime.   Multiple Vitamin (MULTIVITAMIN) capsule Take 1 capsule by mouth daily.   Multiple Vitamins-Minerals (PRESERVISION AREDS 2+MULTI VIT) CAPS Take 1 capsule by mouth in the morning and at bedtime.   Olopatadine HCl (PATADAY OP) Place 1 drop into both eyes daily.   OneTouch Delica Lancets 18D MISC CHECK BLOOD SUGAR ONCE DAILY AND AS DIRECTED   ONETOUCH ULTRA test strip CHECK BLOOD SUGAR ONCE DAILY AND AS DIRECTED. DX E11.9   pioglitazone (ACTOS) 15 MG tablet Take 1 tablet (15 mg total) by mouth daily.   sertraline (ZOLOFT) 50 MG tablet TAKE 1 TABLET BY MOUTH EVERY DAY   sitaGLIPtin (JANUVIA) 100 MG tablet Take 1 tablet (100 mg total) by mouth daily.   trandolapril (MAVIK) 4 MG tablet Take 1 tablet (4 mg total) by mouth daily.   triamcinolone (NASACORT) 55 MCG/ACT AERO nasal inhaler Place 2 sprays into the nose daily. (Patient taking differently: Place 2 sprays into the nose at bedtime.)   vitamin B-12 (CYANOCOBALAMIN) 1000 MCG tablet Take  1,000 mcg by mouth daily.    No facility-administered encounter medications on file as of 06/19/2021.    Patient Active Problem List   Diagnosis Date Noted   Pneumothorax 08/09/2020   Pleural effusion 05/13/2020   Frequent UTI 05/13/2020   Dizziness 02/12/2020   Normocytic anemia 02/12/2020   Fall involving sidewalk curb 11/29/2019   Contusion of back 11/27/2019   Hyperlipidemia associated with type 2 diabetes mellitus ([place])    Aortic atherosclerosis ([place]) 07/13/2019   H/O sepsis 07/01/2019   CKD (chronic kidney disease) stage 4, GFR 15-29 ml/min (HCC) 07/01/2019   Lower abdominal pain 05/11/2019    Routine general medical examination at a health care facility 03/30/2019   External hemorrhoid 01/17/2018   History of colitis 01/17/2018   Generalized weakness 12/24/2016   Diabetic retinopathy ([place]) 12/11/2016   History of CVA (cerebrovascular accident) 12/11/2016   Community acquired pneumonia of left lower lobe of lung 10/07/2015   Estrogen deficiency 08/30/2015   Encounter for Medicare annual wellness exam 04/17/2013   Chest wall pain 04/07/2013   Mobility impaired 06/26/2011   History of retinal detachment 01/10/2011   Sleep apnea 11/28/2010   Anxiety and depression 08/25/2010   Hemiplegia, late effect of cerebrovascular disease ([place]) 07/05/2010   POSTHERPETIC NEURALGIA 11/09/2009   Renal insufficiency 06/29/2008   Chronic back pain 01/26/2008   EDEMA 01/26/2008   B12 deficiency 01/10/2007   Type 2 diabetes, controlled, with retinopathy ([place]) 11/27/2006   Essential hypertension 11/27/2006   FIBROCYSTIC BREAST DISEASE 11/27/2006   ROSACEA 11/27/2006   OSTEOARTHRITIS 11/27/2006   URINARY INCONTINENCE, MIXED 11/27/2006    Past Medical History:  Diagnosis Date   Angioedema    possibly from voltaren   Bronchopneumonia 12/11/2016   Degenerative disc disease    Diabetes mellitus    type II   Hyperlipidemia    Hypertension    LVH (left ventricular hypertrophy)    and atrial enlargement by echo in past with nl EF   Nasal pruritis    Osteoarthritis    Osteopenia    Renal insufficiency    Sleep apnea    Stroke ([place]) 05/2010   Small vessel sobcortical (in [place]) with Dr Leonie Man, residual L hemiparesis   Vitamin B 12 deficiency 04/08    Relevant past medical, surgical, family and social history reviewed and updated as indicated. Interim medical history since our last visit reviewed.  Review of Systems Per HPI unless specifically indicated above     Objective:    Temp (!) 100.6 F (38.1 C) (Oral)   SpO2 (!) 86%   Wt Readings from Last 3 Encounters:  05/24/21  170 lb (77.1 kg)  02/28/21 171 lb (77.6 kg)  01/13/21 176 lb (79.8 kg)    Physical Exam Unable to perform physical examination secondary to lack of equipment.     Assessment & Plan:  1. Hypoxia Acute.  I am concerned about this patient.  Her oxygen level is low, she is having chest pain and shortness of breath.  She needs to be evaluate in-person emergently.  I have called 9-1-1 and have recommended they take her to the hospital to be evaluated.  Follow up plan: Return if symptoms worsen or fail to improve.  This visit was completed via telephone due to the restrictions of the COVID-19 pandemic. All issues as above were discussed and addressed but no physical exam was performed. If it was felt that the patient should be evaluated in the office, they were directed there. The  patient verbally consented to this visit. Patient was unable to complete an audio/visual visit due to Lack of equipment. Location of the patient: home Location of the provider: work Those involved with this call:  Provider: Noemi Chapel, DNP, FNP-C CMA: n/a Front Desk/Registration: Santina Evans  Time spent on call:  8 minutes on the phone discussing health concerns. 15 minutes total spent in review of patient's record and preparation of their chart. I verified patient identity using two factors (patient name and date of birth). Patient consents verbally to being seen via telemedicine visit today.

## 2021-06-19 NOTE — Progress Notes (Signed)
Per Noemi Chapel, NP, requesting EMS called to go to pt's home for assessment of 86%O2 and fever of 100.6. EMS contacted and advised they are on the way.

## 2021-06-19 NOTE — ED Triage Notes (Signed)
Pt to ED for fever and shob since yesterday. Congestion noted. Recently had covid vaccine

## 2021-06-20 LAB — RESP PANEL BY RT-PCR (FLU A&B, COVID) ARPGX2
Influenza A by PCR: NEGATIVE
Influenza B by PCR: NEGATIVE
SARS Coronavirus 2 by RT PCR: NEGATIVE

## 2021-06-21 DIAGNOSIS — D631 Anemia in chronic kidney disease: Secondary | ICD-10-CM | POA: Diagnosis not present

## 2021-06-21 DIAGNOSIS — E1165 Type 2 diabetes mellitus with hyperglycemia: Secondary | ICD-10-CM | POA: Diagnosis not present

## 2021-06-21 DIAGNOSIS — N184 Chronic kidney disease, stage 4 (severe): Secondary | ICD-10-CM | POA: Diagnosis not present

## 2021-06-21 DIAGNOSIS — I1310 Hypertensive heart and chronic kidney disease without heart failure, with stage 1 through stage 4 chronic kidney disease, or unspecified chronic kidney disease: Secondary | ICD-10-CM | POA: Diagnosis not present

## 2021-06-21 DIAGNOSIS — I700 Atherosclerosis of aorta: Secondary | ICD-10-CM | POA: Diagnosis not present

## 2021-06-21 DIAGNOSIS — E1169 Type 2 diabetes mellitus with other specified complication: Secondary | ICD-10-CM | POA: Diagnosis not present

## 2021-06-21 DIAGNOSIS — E11319 Type 2 diabetes mellitus with unspecified diabetic retinopathy without macular edema: Secondary | ICD-10-CM | POA: Diagnosis not present

## 2021-06-21 DIAGNOSIS — R69 Illness, unspecified: Secondary | ICD-10-CM | POA: Diagnosis not present

## 2021-06-21 DIAGNOSIS — E1122 Type 2 diabetes mellitus with diabetic chronic kidney disease: Secondary | ICD-10-CM | POA: Diagnosis not present

## 2021-06-21 DIAGNOSIS — E785 Hyperlipidemia, unspecified: Secondary | ICD-10-CM | POA: Diagnosis not present

## 2021-06-22 ENCOUNTER — Telehealth: Payer: Self-pay | Admitting: Family Medicine

## 2021-06-22 NOTE — Telephone Encounter (Signed)
Received return call from [place], [facility] PT with [place] [phone]~ telephone.   Reports that he did not perform thorough evaluation of patient. States that she had audible rattle with exhale, but he did not auscultate lung fields.   Patient noted to have tolerated HH PT with no desaturations in SpO2. All other VS WNL. No fever noted on 06/21/2021, but patient reported fever on 06/20/2021.  Of note, patient  had virtual appointment on 06/19/2021 for hypoxia with NP. Patient was sent to ER, but left AMA after 14 hour wait. Labs and imaging had been completed.   Patient has in person appointment on 06/23/2021.

## 2021-06-22 NOTE — Telephone Encounter (Signed)
Received voicemail message from [place], PT with [facility]. Lysbeth Galas called to report patient still has rattle when breathing and abnormal lung sounds; no fever on 11/2. Please advise at [phone]

## 2021-06-22 NOTE — Telephone Encounter (Signed)
Call placed to [facility], PT for more information. [place].

## 2021-06-23 ENCOUNTER — Emergency Department (HOSPITAL_COMMUNITY): Payer: Medicare HMO

## 2021-06-23 ENCOUNTER — Encounter: Payer: Self-pay | Admitting: Family Medicine

## 2021-06-23 ENCOUNTER — Other Ambulatory Visit: Payer: Self-pay

## 2021-06-23 ENCOUNTER — Encounter (HOSPITAL_COMMUNITY): Payer: Self-pay

## 2021-06-23 ENCOUNTER — Ambulatory Visit (INDEPENDENT_AMBULATORY_CARE_PROVIDER_SITE_OTHER): Payer: Medicare HMO | Admitting: Family Medicine

## 2021-06-23 ENCOUNTER — Observation Stay (HOSPITAL_COMMUNITY)
Admission: EM | Admit: 2021-06-23 | Discharge: 2021-06-26 | Disposition: A | Discharge status: Home / Self Care | Payer: Medicare HMO | Attending: Emergency Medicine | Admitting: Emergency Medicine

## 2021-06-23 VITALS — BP 130/82 | HR 66 | Temp 98.4°F | Resp 22

## 2021-06-23 DIAGNOSIS — Z8673 Personal history of transient ischemic attack (TIA), and cerebral infarction without residual deficits: Secondary | ICD-10-CM | POA: Diagnosis not present

## 2021-06-23 DIAGNOSIS — N184 Chronic kidney disease, stage 4 (severe): Secondary | ICD-10-CM | POA: Diagnosis not present

## 2021-06-23 DIAGNOSIS — R0602 Shortness of breath: Secondary | ICD-10-CM | POA: Diagnosis not present

## 2021-06-23 DIAGNOSIS — I517 Cardiomegaly: Secondary | ICD-10-CM | POA: Diagnosis not present

## 2021-06-23 DIAGNOSIS — I129 Hypertensive chronic kidney disease with stage 1 through stage 4 chronic kidney disease, or unspecified chronic kidney disease: Secondary | ICD-10-CM | POA: Diagnosis not present

## 2021-06-23 DIAGNOSIS — E1122 Type 2 diabetes mellitus with diabetic chronic kidney disease: Secondary | ICD-10-CM | POA: Insufficient documentation

## 2021-06-23 DIAGNOSIS — R2681 Unsteadiness on feet: Secondary | ICD-10-CM | POA: Insufficient documentation

## 2021-06-23 DIAGNOSIS — E11319 Type 2 diabetes mellitus with unspecified diabetic retinopathy without macular edema: Secondary | ICD-10-CM

## 2021-06-23 DIAGNOSIS — J9601 Acute respiratory failure with hypoxia: Principal | ICD-10-CM | POA: Insufficient documentation

## 2021-06-23 DIAGNOSIS — Z79899 Other long term (current) drug therapy: Secondary | ICD-10-CM | POA: Diagnosis not present

## 2021-06-23 DIAGNOSIS — J189 Pneumonia, unspecified organism: Secondary | ICD-10-CM | POA: Diagnosis not present

## 2021-06-23 DIAGNOSIS — R69 Illness, unspecified: Secondary | ICD-10-CM | POA: Diagnosis not present

## 2021-06-23 DIAGNOSIS — I10 Essential (primary) hypertension: Secondary | ICD-10-CM | POA: Diagnosis not present

## 2021-06-23 DIAGNOSIS — R059 Cough, unspecified: Secondary | ICD-10-CM | POA: Diagnosis not present

## 2021-06-23 DIAGNOSIS — Z7984 Long term (current) use of oral hypoglycemic drugs: Secondary | ICD-10-CM | POA: Diagnosis not present

## 2021-06-23 DIAGNOSIS — Z20822 Contact with and (suspected) exposure to covid-19: Secondary | ICD-10-CM | POA: Diagnosis not present

## 2021-06-23 DIAGNOSIS — G4730 Sleep apnea, unspecified: Secondary | ICD-10-CM | POA: Diagnosis present

## 2021-06-23 DIAGNOSIS — F419 Anxiety disorder, unspecified: Secondary | ICD-10-CM

## 2021-06-23 DIAGNOSIS — R062 Wheezing: Secondary | ICD-10-CM | POA: Diagnosis not present

## 2021-06-23 DIAGNOSIS — J069 Acute upper respiratory infection, unspecified: Secondary | ICD-10-CM

## 2021-06-23 DIAGNOSIS — R0600 Dyspnea, unspecified: Secondary | ICD-10-CM

## 2021-06-23 DIAGNOSIS — F32A Depression, unspecified: Secondary | ICD-10-CM | POA: Diagnosis present

## 2021-06-23 LAB — CBC WITH DIFFERENTIAL/PLATELET
Abs Immature Granulocytes: 0.06 10*3/uL (ref 0.00–0.07)
Basophils Absolute: 0 10*3/uL (ref 0.0–0.1)
Basophils Relative: 1 %
Eosinophils Absolute: 0.1 10*3/uL (ref 0.0–0.5)
Eosinophils Relative: 3 %
HCT: 37.3 % (ref 36.0–46.0)
Hemoglobin: 11.6 g/dL — ABNORMAL LOW (ref 12.0–15.0)
Immature Granulocytes: 1 %
Lymphocytes Relative: 25 %
Lymphs Abs: 1.2 10*3/uL (ref 0.7–4.0)
MCH: 28 pg (ref 26.0–34.0)
MCHC: 31.1 g/dL (ref 30.0–36.0)
MCV: 89.9 fL (ref 80.0–100.0)
Monocytes Absolute: 0.3 10*3/uL (ref 0.1–1.0)
Monocytes Relative: 7 %
Neutro Abs: 3 10*3/uL (ref 1.7–7.7)
Neutrophils Relative %: 63 %
Platelets: 173 10*3/uL (ref 150–400)
RBC: 4.15 MIL/uL (ref 3.87–5.11)
RDW: 13.9 % (ref 11.5–15.5)
WBC: 4.7 10*3/uL (ref 4.0–10.5)
nRBC: 0 % (ref 0.0–0.2)

## 2021-06-23 LAB — COMPREHENSIVE METABOLIC PANEL
ALT: 14 U/L (ref 0–44)
AST: 18 U/L (ref 15–41)
Albumin: 3.2 g/dL — ABNORMAL LOW (ref 3.5–5.0)
Alkaline Phosphatase: 61 U/L (ref 38–126)
Anion gap: 5 (ref 5–15)
BUN: 26 mg/dL — ABNORMAL HIGH (ref 8–23)
CO2: 27 mmol/L (ref 22–32)
Calcium: 8.3 mg/dL — ABNORMAL LOW (ref 8.9–10.3)
Chloride: 109 mmol/L (ref 98–111)
Creatinine, Ser: 1.49 mg/dL — ABNORMAL HIGH (ref 0.44–1.00)
GFR, Estimated: 33 mL/min — ABNORMAL LOW (ref >60)
Glucose, Bld: 280 mg/dL — ABNORMAL HIGH (ref 70–99)
Potassium: 4.1 mmol/L (ref 3.5–5.1)
Sodium: 141 mmol/L (ref 135–145)
Total Bilirubin: 0.3 mg/dL (ref 0.3–1.2)
Total Protein: 5.4 g/dL — ABNORMAL LOW (ref 6.5–8.1)

## 2021-06-23 LAB — CBG MONITORING, ED: Glucose-Capillary: 361 mg/dL — ABNORMAL HIGH (ref 70–99)

## 2021-06-23 LAB — RESP PANEL BY RT-PCR (FLU A&B, COVID) ARPGX2
Influenza A by PCR: NEGATIVE
Influenza B by PCR: NEGATIVE
SARS Coronavirus 2 by RT PCR: NEGATIVE

## 2021-06-23 LAB — BRAIN NATRIURETIC PEPTIDE: B Natriuretic Peptide: 141 pg/mL — ABNORMAL HIGH (ref 0.0–100.0)

## 2021-06-23 MED ORDER — POLYETHYLENE GLYCOL 3350 17 G PO PACK: 17.0000 g | PACK | Freq: Every day | ORAL | Status: DC | PRN

## 2021-06-23 MED ORDER — ACETAMINOPHEN 325 MG PO TABS: 650.0000 mg | ORAL_TABLET | Freq: Four times a day (QID) | ORAL | Status: DC | PRN

## 2021-06-23 MED ORDER — SERTRALINE HCL 50 MG PO TABS
50.0000 mg | ORAL_TABLET | Freq: Every day | ORAL | Status: DC
  Administered 2021-06-24 – 2021-06-26 (×3): 50 mg via ORAL
  Filled 2021-06-23 (×3): qty 1

## 2021-06-23 MED ORDER — SODIUM CHLORIDE 0.9% FLUSH
3.0000 mL | Freq: Two times a day (BID) | INTRAVENOUS | Status: DC
  Administered 2021-06-24 – 2021-06-26 (×6): 3 mL via INTRAVENOUS

## 2021-06-23 MED ORDER — IPRATROPIUM-ALBUTEROL 0.5-2.5 (3) MG/3ML IN SOLN
3.0000 mL | Freq: Once | RESPIRATORY_TRACT | Status: AC
  Administered 2021-06-23: 3 mL via RESPIRATORY_TRACT
  Filled 2021-06-23: qty 3

## 2021-06-23 MED ORDER — PREDNISONE 20 MG PO TABS
40.0000 mg | ORAL_TABLET | Freq: Every day | ORAL | Status: DC
  Administered 2021-06-24 – 2021-06-26 (×3): 40 mg via ORAL
  Filled 2021-06-23 (×3): qty 2

## 2021-06-23 MED ORDER — PREDNISONE 20 MG PO TABS
60.0000 mg | ORAL_TABLET | Freq: Once | ORAL | Status: AC
  Administered 2021-06-23: 60 mg via ORAL
  Filled 2021-06-23: qty 3

## 2021-06-23 MED ORDER — MIRTAZAPINE 15 MG PO TABS
15.0000 mg | ORAL_TABLET | Freq: Every day | ORAL | Status: DC
  Administered 2021-06-24 – 2021-06-25 (×3): 15 mg via ORAL
  Filled 2021-06-23 (×3): qty 1

## 2021-06-23 MED ORDER — GUAIFENESIN 100 MG/5ML PO LIQD
5.0000 mL | ORAL | Status: DC | PRN
  Administered 2021-06-24 – 2021-06-25 (×2): 5 mL via ORAL
  Filled 2021-06-23 (×3): qty 5

## 2021-06-23 MED ORDER — ENOXAPARIN SODIUM 30 MG/0.3ML IJ SOSY
30.0000 mg | PREFILLED_SYRINGE | INTRAMUSCULAR | Status: DC
  Administered 2021-06-24 – 2021-06-26 (×3): 30 mg via SUBCUTANEOUS
  Filled 2021-06-23 (×3): qty 0.3

## 2021-06-23 MED ORDER — SODIUM CHLORIDE 0.9 % IV SOLN
1.0000 g | INTRAVENOUS | Status: DC
  Administered 2021-06-24 (×2): 1 g via INTRAVENOUS
  Filled 2021-06-23 (×2): qty 10

## 2021-06-23 MED ORDER — ALBUTEROL SULFATE (2.5 MG/3ML) 0.083% IN NEBU: 2.5000 mg | INHALATION_SOLUTION | RESPIRATORY_TRACT | Status: DC | PRN

## 2021-06-23 MED ORDER — OLOPATADINE HCL 0.1 % OP SOLN
1.0000 [drp] | Freq: Two times a day (BID) | OPHTHALMIC | Status: DC | PRN
  Filled 2021-06-23: qty 5

## 2021-06-23 MED ORDER — INSULIN ASPART 100 UNIT/ML IJ SOLN
0.0000 [IU] | Freq: Three times a day (TID) | INTRAMUSCULAR | Status: DC
  Administered 2021-06-24 (×3): 5 [IU] via SUBCUTANEOUS
  Administered 2021-06-25: 2 [IU] via SUBCUTANEOUS

## 2021-06-23 MED ORDER — METOPROLOL SUCCINATE ER 25 MG PO TB24
12.5000 mg | ORAL_TABLET | Freq: Two times a day (BID) | ORAL | Status: DC
  Administered 2021-06-24 – 2021-06-26 (×6): 12.5 mg via ORAL
  Filled 2021-06-23 (×6): qty 1

## 2021-06-23 MED ORDER — ALPRAZOLAM 0.25 MG PO TABS
0.2500 mg | ORAL_TABLET | Freq: Two times a day (BID) | ORAL | Status: DC | PRN
  Administered 2021-06-25 – 2021-06-26 (×2): 0.25 mg via ORAL
  Filled 2021-06-23 (×2): qty 1

## 2021-06-23 MED ORDER — DOCUSATE SODIUM 100 MG PO CAPS
200.0000 mg | ORAL_CAPSULE | Freq: Every day | ORAL | Status: DC
  Administered 2021-06-24 – 2021-06-25 (×2): 200 mg via ORAL
  Filled 2021-06-23 (×2): qty 2

## 2021-06-23 MED ORDER — HYDRALAZINE HCL 50 MG PO TABS
100.0000 mg | ORAL_TABLET | Freq: Three times a day (TID) | ORAL | Status: DC
  Administered 2021-06-24 – 2021-06-26 (×8): 100 mg via ORAL
  Filled 2021-06-23 (×8): qty 2

## 2021-06-23 MED ORDER — AMLODIPINE BESYLATE 10 MG PO TABS
10.0000 mg | ORAL_TABLET | Freq: Every day | ORAL | Status: DC
  Administered 2021-06-24 – 2021-06-26 (×3): 10 mg via ORAL
  Filled 2021-06-23 (×3): qty 1

## 2021-06-23 MED ORDER — TRANDOLAPRIL 1 MG PO TABS
4.0000 mg | ORAL_TABLET | Freq: Every day | ORAL | Status: DC
  Administered 2021-06-24 – 2021-06-26 (×3): 4 mg via ORAL
  Filled 2021-06-23 (×3): qty 4

## 2021-06-23 MED ORDER — ACETAMINOPHEN 650 MG RE SUPP: 650.0000 mg | Freq: Four times a day (QID) | RECTAL | Status: DC | PRN

## 2021-06-23 MED ORDER — IPRATROPIUM-ALBUTEROL 0.5-2.5 (3) MG/3ML IN SOLN
3.0000 mL | Freq: Four times a day (QID) | RESPIRATORY_TRACT | Status: DC
  Administered 2021-06-24 (×2): 3 mL via RESPIRATORY_TRACT
  Filled 2021-06-23 (×2): qty 3

## 2021-06-23 MED ORDER — ASPIRIN EC 325 MG PO TBEC
325.0000 mg | DELAYED_RELEASE_TABLET | Freq: Every day | ORAL | Status: DC
  Administered 2021-06-24 – 2021-06-26 (×3): 325 mg via ORAL
  Filled 2021-06-23 (×3): qty 1

## 2021-06-23 NOTE — ED Notes (Signed)
Attempted 2 IV sticks, unable to get access

## 2021-06-23 NOTE — ED Provider Notes (Signed)
Emergency Medicine Provider Triage Evaluation Note  Renee [name] , a 87 y.o. female  was evaluated in triage.  Pt complains of cough, shortness of breath, fever.  Seen at PCPs office today, reported SPO2 at 90% on room air.  Reports symptoms are worsening over this week.  Review of Systems  Positive: Cough, fever, sob Negative: cp  Physical Exam  BP (!) 150/60 (BP Location: Right Arm)   Pulse (!) 57   Temp 98.2 F (36.8 C)   Resp (!) 24   SpO2 95%  Gen:   Awake, no distress   Resp:  Rhonchorous lung sounds bilaterally.  Wet/productive cough noted. MSK:   Moves extremities without difficulty.  Bilateral lower extremity edema.   Medical Decision Making  Medically screening exam initiated at 1:53 PM.  Appropriate orders placed.  Renee [name] was informed that the remainder of the evaluation will be completed by another provider, this initial triage assessment does not replace that evaluation, and the importance of remaining in the ED until their evaluation is complete.  Labs, cxr, resp panel   Renee [place], PA-C 06/23/21 1354    Renee Bo, MD 06/24/21 [phone]

## 2021-06-23 NOTE — ED Triage Notes (Signed)
Pt reports 1 week of cough, congestion, sob and wheezing. Pt seen at PCP and was told she had pneumonia and to come here. Pt denies any pain. 95% on room air. Pt a.o

## 2021-06-23 NOTE — Progress Notes (Signed)
Subjective:    Patient ID: Renee [name], female    DOB: 1928/10/25, 87 y.o.   MRN: [id number] Patient presents today with shortness of breath fever and cough.  She awoke Saturday morning with a fever to 100.6.  She had a cough and chest congestion.  By Monday she went to the emergency room.  In the emergency room, chest x-ray was reportedly normal.  Flu and COVID test were negative.  Troponin was mildly elevated at 20.  BNP was elevated over 200.  Patient left without being seen.  She presents today with worsening status.  Although she is not febrile here she continues to have fevers at home.  She continues to have cough productive of yellow mucus.  She reports chest congestion and shortness of breath.  Today she has increased work of breathing and an elevated respiratory rate.  On examination she has pronounced left basilar crackles and rhonchorous breath sounds all throughout the left lung.  Right lung is relatively clear.  Concerning for left-sided pneumonia.  Oxygen saturation is 90% on room air Past Medical History:  Diagnosis Date   Angioedema    possibly from voltaren   Bronchopneumonia 12/11/2016   Degenerative disc disease    Diabetes mellitus    type II   Hyperlipidemia    Hypertension    LVH (left ventricular hypertrophy)    and atrial enlargement by echo in past with nl EF   Nasal pruritis    Osteoarthritis    Osteopenia    Renal insufficiency    Sleep apnea    Stroke ([place]) 05/2010   Small vessel sobcortical (in [place] trial) with Dr Leonie Man, residual L hemiparesis   Vitamin B 12 deficiency 04/08   Past Surgical History:  Procedure Laterality Date   ABDOMINAL HYSTERECTOMY     BSO-fibroids   APPENDECTOMY     BACK SURGERY     COLON SURGERY     due to punctured intestines   EYE SURGERY     cataract extraction   IR THORACENTESIS ASP PLEURAL SPACE W/IMG GUIDE  06/07/2020   KNEE SURGERY     arthroscope   PARS PLANA VITRECTOMY  07/31/2011   Procedure: PARS PLANA  VITRECTOMY WITH 25 GAUGE;  Surgeon: [name], MD;  Location: [facility];  Service: Ophthalmology;  Laterality: Right;  REMOVAL OF SILICONE OIL AND LASER RIGHT EYE   RETINAL DETACHMENT SURGERY  02/18/11   times 2   SPINE SURGERY  08/09   spinal decompression surgery   Current Outpatient Medications on File Prior to Visit  Medication Sig Dispense Refill   albuterol (VENTOLIN HFA) 108 (90 Base) MCG/ACT inhaler INHALE 2 PUFFS INTO THE LUNGS EVERY 4 HOURS AS NEEDED FOR WHEEZING/SHORTNESS OF BREATH 18 each 3   ALPRAZolam (XANAX) 0.5 MG tablet TAKE 1/2 TO 1 TABLET BY MOUTH TWICE A DAY AS NEEDED FOR ANXIETY 60 tablet 2   amLODipine (NORVASC) 10 MG tablet Take 1 tablet (10 mg total) by mouth daily. 90 tablet 3   Blood Glucose Monitoring Suppl (ONE TOUCH ULTRA 2) w/Device KIT Check blood sugar once daily and as directed. Dx E11.9 1 each 0   calcium-vitamin D (OSCAL WITH D) 500-200 MG-UNIT per tablet Take 1 tablet by mouth daily.     cephALEXin (KEFLEX) 500 MG capsule TAKE 1 CAPSULE (500 MG TOTAL) BY MOUTH DAILY. 30 capsule 3   Cholecalciferol (VITAMIN D) 1000 UNITS capsule Take 1,000 Units by mouth daily.     Cranberry 500 MG  TABS Take 500 mg by mouth in the morning and at bedtime.     docusate sodium (COLACE) 100 MG capsule Take 200 mg by mouth at bedtime.     furosemide (LASIX) 20 MG tablet TAKE 1/2 TABLET BY MOUTH EVERY DAY 45 tablet 1   glipiZIDE (GLIPIZIDE XL) 10 MG 24 hr tablet Take 1 tablet (10 mg total) by mouth daily with breakfast. 90 tablet 3   hydrALAZINE (APRESOLINE) 100 MG tablet Take 1 tablet (100 mg total) by mouth 3 (three) times daily. 270 tablet 3   metoprolol succinate (TOPROL-XL) 25 MG 24 hr tablet Take 0.5 tablets (12.5 mg total) by mouth 2 (two) times daily. 90 tablet 3   mirtazapine (REMERON) 15 MG tablet Take 1 tablet (15 mg total) by mouth at bedtime. 90 tablet 3   Multiple Vitamin (MULTIVITAMIN) capsule Take 1 capsule by mouth daily.     Multiple Vitamins-Minerals  (PRESERVISION AREDS 2+MULTI VIT) CAPS Take 1 capsule by mouth in the morning and at bedtime.     Olopatadine HCl (PATADAY OP) Place 1 drop into both eyes daily.     OneTouch Delica Lancets 87G MISC CHECK BLOOD SUGAR ONCE DAILY AND AS DIRECTED 100 each 2   ONETOUCH ULTRA test strip CHECK BLOOD SUGAR ONCE DAILY AND AS DIRECTED. DX E11.9 100 strip 1   pioglitazone (ACTOS) 15 MG tablet Take 1 tablet (15 mg total) by mouth daily. 30 tablet 5   sertraline (ZOLOFT) 50 MG tablet TAKE 1 TABLET BY MOUTH EVERY DAY 90 tablet 1   sitaGLIPtin (JANUVIA) 100 MG tablet Take 1 tablet (100 mg total) by mouth daily. 90 tablet 3   trandolapril (MAVIK) 4 MG tablet Take 1 tablet (4 mg total) by mouth daily. 90 tablet 3   triamcinolone (NASACORT) 55 MCG/ACT AERO nasal inhaler Place 2 sprays into the nose daily. (Patient taking differently: Place 2 sprays into the nose at bedtime.) 1 each 5   vitamin B-12 (CYANOCOBALAMIN) 1000 MCG tablet Take 1,000 mcg by mouth daily.      No current facility-administered medications on file prior to visit.   Allergies  Allergen Reactions   Diclofenac Sodium Other (See Comments)    REACTION: angioedema   Pioglitazone Swelling   Social History   Socioeconomic History   Marital status: Widowed    Spouse name: Not on file   Number of children: Not on file   Years of education: Not on file   Highest education level: Not on file  Occupational History   Not on file  Tobacco Use   Smoking status: Never   Smokeless tobacco: Never  Vaping Use   Vaping Use: Not on file  Substance and Sexual Activity   Alcohol use: No    Alcohol/week: 0.0 standard drinks   Drug use: No   Sexual activity: Never  Other Topics Concern   Not on file  Social History Narrative   Not on file   Social Determinants of Health   Financial Resource Strain: Low Risk    Difficulty of Paying Living Expenses: Not hard at all  Food Insecurity: No Food Insecurity   Worried About Charity fundraiser in  the Last Year: Never true   [place] in the Last Year: Never true  Transportation Needs: No Transportation Needs   Lack of Transportation (Medical): No   Lack of Transportation (Non-Medical): No  Physical Activity: Inactive   Days of Exercise per Week: 0 days   Minutes of Exercise per  Session: 0 min  Stress: No Stress Concern Present   Feeling of Stress : Not at all  Social Connections: Socially Isolated   Frequency of Communication with Friends and Family: More than three times a week   Frequency of Social Gatherings with Friends and Family: Once a week   Attends Religious Services: Never   Marine scientist or Organizations: No   Attends Archivist Meetings: Never   Marital Status: Widowed  Human resources officer Violence: Not At Risk   Fear of Current or Ex-Partner: No   Emotionally Abused: No   Physically Abused: No   Sexually Abused: No      Review of Systems  Musculoskeletal:  Positive for back pain.  All other systems reviewed and are negative.     Objective:   Physical Exam Vitals reviewed.  Constitutional:      General: She is not in acute distress.    Appearance: Normal appearance. She is obese. She is not ill-appearing, toxic-appearing or diaphoretic.  HENT:     Right Ear: Tympanic membrane and ear canal normal.     Left Ear: Tympanic membrane and ear canal normal.     Nose: Nose normal. No congestion or rhinorrhea.  Cardiovascular:     Rate and Rhythm: Normal rate and regular rhythm.     Heart sounds: Normal heart sounds. No murmur heard.   No friction rub. No gallop.  Pulmonary:     Effort: Tachypnea present. No respiratory distress.     Breath sounds: No stridor. Wheezing, rhonchi and rales present.    Musculoskeletal:     Right lower leg: No edema.     Left lower leg: No edema.  Neurological:     Mental Status: She is alert and oriented to person, place, and time. Mental status is at baseline.     Sensory: No sensory deficit.      Motor: Weakness present.     Coordination: Coordination abnormal.     Gait: Gait abnormal.  Chronic left facial droop due to her previous remote right basal ganglial infarct        Assessment & Plan:  Pneumonia of left lower lobe due to infectious organism Patient has pronounced left-sided pneumonia based on physical exam.  She is borderline hypoxic.  She has an increased respiratory rate.  This coupled with her advanced age and medical comorbidities makes me concerned that this patient is not a candidate to stay at home and try oral antibiotics over the weekend.  She lives alone.    I believe she would benefit from hospitalization and IV antibiotics and supervision in case her situation deteriorates further.  Therefore I recommended that she go to the emergency room for further evaluation and treatment.

## 2021-06-23 NOTE — ED Provider Notes (Signed)
[facility] EMERGENCY DEPARTMENT Provider Note   CSN: [id number] Arrival date & time: 06/23/21  1245     History Chief Complaint  Patient presents with   Shortness of Breath   Cough    Renee [name] is a 87 y.o. female.  HPI  87 year old female with past medical history of HTN, HLD, CVA, asthma presents the emergency department with productive cough, shortness of breath and low-grade fever.  She is seen at her primary doctor's office today, they reported SPO2 of 90% on room air at rest.  He came in for evaluation.  Denies any chest pain, back pain, lower extremity swelling.  No GI symptoms.  Has been using her home albuterol inhaler without relief.  Past Medical History:  Diagnosis Date   Angioedema    possibly from voltaren   Bronchopneumonia 12/11/2016   Degenerative disc disease    Diabetes mellitus    type II   Hyperlipidemia    Hypertension    LVH (left ventricular hypertrophy)    and atrial enlargement by echo in past with nl EF   Nasal pruritis    Osteoarthritis    Osteopenia    Renal insufficiency    Sleep apnea    Stroke ([place]) 05/2010   Small vessel sobcortical ([place]) with Dr Leonie Man, residual L hemiparesis   Vitamin B 12 deficiency 04/08    Patient Active Problem List   Diagnosis Date Noted   Pneumothorax 08/09/2020   Pleural effusion 05/13/2020   Frequent UTI 05/13/2020   Dizziness 02/12/2020   Normocytic anemia 02/12/2020   Fall involving sidewalk curb 11/29/2019   Contusion of back 11/27/2019   Hyperlipidemia associated with type 2 diabetes mellitus ([place])    Aortic atherosclerosis ([place]) 07/13/2019   H/O sepsis 07/01/2019   CKD (chronic kidney disease) stage 4, GFR 15-29 ml/min ([place]) 07/01/2019   Lower abdominal pain 05/11/2019   Routine general medical examination at a health care facility 03/30/2019   External hemorrhoid 01/17/2018   History of colitis 01/17/2018   Generalized weakness 12/24/2016   Diabetic  retinopathy ([place]) 12/11/2016   History of CVA (cerebrovascular accident) 12/11/2016   Community acquired pneumonia of left lower lobe of lung 10/07/2015   Estrogen deficiency 08/30/2015   Encounter for Medicare annual wellness exam 04/17/2013   Chest wall pain 04/07/2013   Mobility impaired 06/26/2011   History of retinal detachment 01/10/2011   Sleep apnea 11/28/2010   Anxiety and depression 08/25/2010   Hemiplegia, late effect of cerebrovascular disease ([place]) 07/05/2010   POSTHERPETIC NEURALGIA 11/09/2009   Renal insufficiency 06/29/2008   Chronic back pain 01/26/2008   EDEMA 01/26/2008   B12 deficiency 01/10/2007   Type 2 diabetes, controlled, with retinopathy ([place]) 11/27/2006   Essential hypertension 11/27/2006   FIBROCYSTIC BREAST DISEASE 11/27/2006   ROSACEA 11/27/2006   OSTEOARTHRITIS 11/27/2006   URINARY INCONTINENCE, MIXED 11/27/2006    Past Surgical History:  Procedure Laterality Date   ABDOMINAL HYSTERECTOMY     BSO-fibroids   APPENDECTOMY     BACK SURGERY     COLON SURGERY     due to punctured intestines   EYE SURGERY     cataract extraction   IR THORACENTESIS ASP PLEURAL SPACE W/IMG GUIDE  06/07/2020   KNEE SURGERY     arthroscope   PARS PLANA VITRECTOMY  07/31/2011   Procedure: PARS PLANA VITRECTOMY WITH 25 GAUGE;  Surgeon: [name], MD;  Location: [facility];  Service: Ophthalmology;  Laterality: Right;  REMOVAL  OF SILICONE OIL AND LASER RIGHT EYE   RETINAL DETACHMENT SURGERY  02/18/11   times 2   SPINE SURGERY  08/09   spinal decompression surgery     OB History   No obstetric history on file.     Family History  Problem Relation Age of Onset   COPD Brother    Cancer Sister        brain tumor with hemmorhage   Heart disease Sister        CAD    Social History   Tobacco Use   Smoking status: Never   Smokeless tobacco: Never  Substance Use Topics   Alcohol use: No    Alcohol/week: 0.0 standard drinks   Drug use: No    Home  Medications Prior to Admission medications   Medication Sig Start Date End Date Taking? Authorizing Provider  albuterol (VENTOLIN HFA) 108 (90 Base) MCG/ACT inhaler INHALE 2 PUFFS INTO THE LUNGS EVERY 4 HOURS AS NEEDED FOR WHEEZING/SHORTNESS OF BREATH 10/21/20   Tower, Wynelle Fanny, MD  ALPRAZolam (XANAX) 0.5 MG tablet TAKE 1/2 TO 1 TABLET BY MOUTH TWICE A DAY AS NEEDED FOR ANXIETY 05/16/21   [name], MD  amLODipine (NORVASC) 10 MG tablet Take 1 tablet (10 mg total) by mouth daily. 09/28/20   [name], MD  Blood Glucose Monitoring Suppl (ONE TOUCH ULTRA 2) w/Device KIT Check blood sugar once daily and as directed. Dx E11.9 08/27/18   Tower, Wynelle Fanny, MD  calcium-vitamin D (OSCAL WITH D) 500-200 MG-UNIT per tablet Take 1 tablet by mouth daily.    [provider]  cephALEXin (KEFLEX) 500 MG capsule TAKE 1 CAPSULE (500 MG TOTAL) BY MOUTH DAILY. 05/09/21   [name], MD  Cholecalciferol (VITAMIN D) 1000 UNITS capsule Take 1,000 Units by mouth daily.    [provider]  Cranberry 500 MG TABS Take 500 mg by mouth in the morning and at bedtime.    [provider]  docusate sodium (COLACE) 100 MG capsule Take 200 mg by mouth at bedtime.    [provider]  furosemide (LASIX) 20 MG tablet TAKE 1/2 TABLET BY MOUTH EVERY DAY 06/01/21   Chesley Mires, MD  glipiZIDE (GLIPIZIDE XL) 10 MG 24 hr tablet Take 1 tablet (10 mg total) by mouth daily with breakfast. 02/28/21   [name], MD  hydrALAZINE (APRESOLINE) 100 MG tablet Take 1 tablet (100 mg total) by mouth 3 (three) times daily. 09/28/20   [name], MD  metoprolol succinate (TOPROL-XL) 25 MG 24 hr tablet Take 0.5 tablets (12.5 mg total) by mouth 2 (two) times daily. 09/28/20   [name], MD  mirtazapine (REMERON) 15 MG tablet Take 1 tablet (15 mg total) by mouth at bedtime. 05/26/21   [name], MD  Multiple Vitamin (MULTIVITAMIN) capsule Take 1 capsule by mouth daily.    [provider]  Multiple Vitamins-Minerals (PRESERVISION AREDS 2+MULTI VIT) CAPS Take 1 capsule by mouth in the morning and at bedtime.    [provider]  Olopatadine HCl (PATADAY OP) Place 1 drop into both eyes daily.    [provider]  OneTouch Delica Lancets 39Q MISC CHECK BLOOD SUGAR ONCE DAILY AND AS DIRECTED 05/04/21   [name], MD  ONETOUCH ULTRA test strip CHECK BLOOD SUGAR ONCE DAILY AND AS DIRECTED. DX E11.9 12/27/20   [name], MD  pioglitazone (ACTOS) 15 MG tablet Take 1 tablet (15 mg total) by  mouth daily. 05/26/21   [name], MD  sertraline (ZOLOFT) 50 MG tablet TAKE 1 TABLET BY MOUTH EVERY DAY 03/03/21   [name], MD  sitaGLIPtin (JANUVIA) 100 MG tablet Take 1 tablet (100 mg total) by mouth daily. 09/28/20   [name], MD  trandolapril (MAVIK) 4 MG tablet Take 1 tablet (4 mg total) by mouth daily. 09/28/20   [name], MD  triamcinolone (NASACORT) 55 MCG/ACT AERO nasal inhaler Place 2 sprays into the nose daily. Patient taking differently: Place 2 sprays into the nose at bedtime. 03/28/20   [name], MD  vitamin B-12 (CYANOCOBALAMIN) 1000 MCG tablet Take 1,000 mcg by mouth daily.     [provider]    Allergies    Diclofenac sodium and Pioglitazone  Review of Systems   Review of Systems  Constitutional:  Positive for chills, fatigue and fever.  HENT:  Negative for congestion.   Eyes:  Negative for visual disturbance.  Respiratory:  Positive for cough and shortness of breath. Negative for chest tightness.   Cardiovascular:  Negative for chest pain, palpitations and leg swelling.  Gastrointestinal:  Negative for abdominal pain, diarrhea and vomiting.  Genitourinary:  Negative for dysuria.  Musculoskeletal:  Positive for back pain and myalgias.  Skin:  Negative for rash.  Neurological:  Negative for headaches.   Physical Exam Updated Vital Signs BP (!) 165/65   Pulse 62   Temp 97.6 F  (36.4 C) (Oral)   Resp (!) 23   Ht 5' 2"  (1.575 m)   Wt 78.9 kg   SpO2 91%   BMI 31.81 kg/m   Physical Exam Vitals and nursing note reviewed.  Constitutional:      Appearance: Normal appearance.  HENT:     Head: Normocephalic.     Mouth/Throat:     Mouth: Mucous membranes are moist.  Cardiovascular:     Rate and Rhythm: Normal rate.  Pulmonary:     Effort: Tachypnea present. No respiratory distress.     Breath sounds: Wheezing and rales present. No decreased breath sounds.  Abdominal:     Palpations: Abdomen is soft.     Tenderness: There is no abdominal tenderness.  Musculoskeletal:     Right lower leg: No edema.     Left lower leg: No edema.  Skin:    General: Skin is warm.  Neurological:     Mental Status: She is alert and oriented to person, place, and time. Mental status is at baseline.  Psychiatric:        Mood and Affect: Mood normal.    ED Results / Procedures / Treatments   Labs (all labs ordered are listed, but only abnormal results are displayed) Labs Reviewed  COMPREHENSIVE METABOLIC PANEL - Abnormal; Notable for the following components:      Result Value   Glucose, Bld 280 (*)    BUN 26 (*)    Creatinine, Ser 1.49 (*)    Calcium 8.3 (*)    Total Protein 5.4 (*)    Albumin 3.2 (*)    GFR, Estimated 33 (*)    All other components within normal limits  BRAIN NATRIURETIC PEPTIDE - Abnormal; Notable for the following components:   B Natriuretic Peptide 141.0 (*)    All other components within normal limits  RESP PANEL BY RT-PCR (FLU A&B, COVID) ARPGX2  CBC WITH DIFFERENTIAL/PLATELET  CBC WITH DIFFERENTIAL/PLATELET    EKG None  Radiology DG Chest 2 View  Result Date:  06/23/2021 CLINICAL DATA:  One week history of cough, congestion, shortness of breath and wheezing. EXAM: CHEST - 2 VIEW COMPARISON:  06/19/2021. FINDINGS: Cardiac silhouette is mildly enlarged. No mediastinal or hilar masses or evidence of adenopathy. Clear lungs.  No pleural  effusion or pneumothorax. Skeletal structures are intact. IMPRESSION: No acute cardiopulmonary disease. Electronically Signed   By: Lajean Manes M.D.   On: 06/23/2021 14:42    Procedures Procedures   Medications Ordered in ED Medications  ipratropium-albuterol (DUONEB) 0.5-2.5 (3) MG/3ML nebulizer solution 3 mL (3 mLs Nebulization Given 06/23/21 1625)  predniSONE (DELTASONE) tablet 60 mg (60 mg Oral Given 06/23/21 1622)    ED Course  I have reviewed the triage vital signs and the nursing notes.  Pertinent labs & imaging results that were available during my care of the patient were reviewed by me and considered in my medical decision making (see chart for details).    MDM Rules/Calculators/A&P                           87 year old female presents emergency department shortness of breath, productive cough, fatigue.  She is tachypneic on arrival.  Diffuse wheezing and rhonchorous breath sounds bilaterally.  Periodically drops her oxygen saturation to 90% at rest.  Patient does not believe that she has a history of asthma/COPD but states that she does have an inhaler at home that she uses for rescue and at one point did have an inhaler that she used persistently.  Blood work is reassuring, no acute leukocytosis, flu and COVID swab is negative.  She has a mild baseline AKI.  Chest x-ray shows no focal pneumonia.  After breathing treatments and steroids patient continues to have ongoing wheezing and rhonchorous breath sounds.  She is coughing up thick yellow phlegm.  We ambulated her on room air which is her baseline and she dropped her oxygen saturations to the mid 80s and became dyspneic.  Plan to treat patient for upper respiratory infection/bronchitis and admit.  Patients evaluation and results requires admission for further treatment and care. Patient agrees with admission plan, offers no new complaints and is stable/unchanged at time of admit.  Final Clinical Impression(s) / ED  Diagnoses Final diagnoses:  None    Rx / DC Orders ED Discharge Orders     None        Lorelle Gibbs, DO 06/23/21 2203

## 2021-06-23 NOTE — H&P (Signed)
History and Physical   Renee [name] OQH:[id number] DOB: [birth year] DOA: 06/23/2021  PCP: [name], MD   Patient coming from: Home/PCP office  Chief Complaint: Shortness of breath, wheezing, congestion  HPI: Renee [name] is a 87 y.o. female with medical history significant of anxiety, depression, CKD 4, diabetes, hypertension, fibrotic wrist disease, weakness, history of CVA with residual left hemiparesis, hemorrhoids, hyperlipidemia, pleural effusion presenting with 1 week of a myriad of symptoms including cough shortness of breath and wheezing.  As above patient states she began having symptoms around 1 week ago.  Consisting of cough, congestion, shortness of breath, wheezing.  She did go to the emergency department on October 31 however she left without being seen.  Her labs at that time were benign did have a mild BNP elevation to 200s.  She went to see her PCP earlier today and based on her story and physical exam with wheezing and rhonchi they were concerned for possible pneumonia, also concerning to them was her 90% saturation on room air in their office and her reported fever at home.  She was sent to the ED for further evaluation.  She does report fever last Saturday morning around 100.6.  No fever thus far here.  She also has had a productive cough with thick yellow sputum.  She does take an albuterol inhaler but is never been formally diagnosed with COPD or asthma.  Unclear if this was prescribed for the period of time when she had her pleural effusion.  She does report similar episodes of shortness of breath in the past that she has taken albuterol for.  She denies chills, chest pain, abdominal pain, constipation, diarrhea, nausea, vomiting.   ED Course: Vital signs in the ED significant for heart rate in the 50s to 60s, blood pressure in the 546F to 681 systolic, respiratory rate in the teens to 20s.  Was saturating in the low 90s on room air and then desaturated to 85% with  ambulation on room air.  Lab work-up in the ED showed CMP with creatinine stable 1.49, BUN 26, glucose 280, calcium 8.3, protein 5.4, albumin 3.2.  CBC with hemoglobin stable 11.6.  BMP 141 this is down from 200 a week ago.  Respiratory panel for flu and COVID-negative.  Chest x-ray showed no acute abnormality per read.  Patient received DuoNeb x3 and prednisone in the ED.  Review of Systems: As per HPI otherwise all other systems reviewed and are negative.  Past Medical History:  Diagnosis Date   Angioedema    possibly from voltaren   Bronchopneumonia 12/11/2016   Degenerative disc disease    Diabetes mellitus    type II   Hyperlipidemia    Hypertension    LVH (left ventricular hypertrophy)    and atrial enlargement by echo in past with nl EF   Nasal pruritis    Osteoarthritis    Osteopenia    Renal insufficiency    Sleep apnea    Stroke ([place]) 05/2010   Small vessel sobcortical (in [place] trial) with Dr Leonie Man, residual L hemiparesis   Vitamin B 12 deficiency 04/08    Past Surgical History:  Procedure Laterality Date   ABDOMINAL HYSTERECTOMY     BSO-fibroids   APPENDECTOMY     BACK SURGERY     COLON SURGERY     due to punctured intestines   EYE SURGERY     cataract extraction   IR THORACENTESIS ASP PLEURAL SPACE W/IMG GUIDE  06/07/2020  KNEE SURGERY     arthroscope   PARS PLANA VITRECTOMY  07/31/2011   Procedure: PARS PLANA VITRECTOMY WITH 25 GAUGE;  Surgeon: [name], MD;  Location: [facility];  Service: Ophthalmology;  Laterality: Right;  REMOVAL OF SILICONE OIL AND LASER RIGHT EYE   RETINAL DETACHMENT SURGERY  02/18/11   times 2   SPINE SURGERY  08/09   spinal decompression surgery    Social History  reports that she has never smoked. She has never used smokeless tobacco. She reports that she does not drink alcohol and does not use drugs.  Allergies  Allergen Reactions   Tape Other (See Comments)    The patient's skin is VERY THIN- will TEAR EASILY!!    Diclofenac Sodium Swelling and Other (See Comments)    Angioedema   Pioglitazone Swelling and Other (See Comments)    Re-started in 2022    Family History  Problem Relation Age of Onset   COPD Brother    Cancer Sister        brain tumor with hemmorhage   Heart disease Sister        CAD  Reviewed on admission  Prior to Admission medications   Medication Sig Start Date End Date Taking? Authorizing Provider  acetaminophen (TYLENOL) 500 MG tablet Take 1,000 mg by mouth at bedtime.   Yes [provider]  albuterol (VENTOLIN HFA) 108 (90 Base) MCG/ACT inhaler INHALE 2 PUFFS INTO THE LUNGS EVERY 4 HOURS AS NEEDED FOR WHEEZING/SHORTNESS OF BREATH Patient taking differently: Inhale 2 puffs into the lungs every 4 (four) hours as needed for shortness of breath or wheezing. 10/21/20  Yes Tower, Wynelle Fanny, MD  ALPRAZolam (XANAX) 0.5 MG tablet TAKE 1/2 TO 1 TABLET BY MOUTH TWICE A DAY AS NEEDED FOR ANXIETY Patient taking differently: Take 0.25-0.5 mg by mouth 2 (two) times daily as needed for anxiety. 05/16/21  Yes [name], MD  amLODipine (NORVASC) 10 MG tablet Take 1 tablet (10 mg total) by mouth daily. 09/28/20  Yes [name], MD  aspirin EC 325 MG tablet Take 325 mg by mouth in the morning.   Yes [provider]  calcium-vitamin D (OSCAL WITH D) 500-200 MG-UNIT per tablet Take 1 tablet by mouth daily.   Yes [provider]  Cholecalciferol (VITAMIN D-3) 25 MCG (1000 UT) CAPS Take 1,000 Units by mouth in the morning.   Yes [provider]  Cranberry 500 MG TABS Take 500 mg by mouth in the morning and at bedtime.   Yes [provider]  docusate sodium (COLACE) 100 MG capsule Take 200 mg by mouth at bedtime.   Yes [provider]  FLONASE 50 MCG/ACT nasal spray Place 2 sprays into both nostrils See admin instructions. Instill 2 sprays into each nostril at bedtime when not using Nasacort   Yes [provider]  glipiZIDE  (GLIPIZIDE XL) 10 MG 24 hr tablet Take 1 tablet (10 mg total) by mouth daily with breakfast. 02/28/21  Yes [name], MD  hydrALAZINE (APRESOLINE) 100 MG tablet Take 1 tablet (100 mg total) by mouth 3 (three) times daily. 09/28/20  Yes [name], MD  metoprolol succinate (TOPROL-XL) 25 MG 24 hr tablet Take 0.5 tablets (12.5 mg total) by mouth 2 (two) times daily. Patient taking differently: Take 12.5 mg by mouth in the morning and at bedtime. 09/28/20  Yes [name], MD  mirtazapine (REMERON) 15 MG tablet Take 1 tablet (15 mg total) by  mouth at bedtime. 05/26/21  Yes [name], MD  Multiple Vitamin (MULTIVITAMIN) capsule Take 1 capsule by mouth daily.   Yes [provider]  Multiple Vitamins-Minerals (PRESERVISION AREDS 2+MULTI VIT) CAPS Take 1 capsule by mouth in the morning and at bedtime.   Yes [provider]  Olopatadine HCl (PATADAY OP) Place 1 drop into both eyes 2 (two) times daily as needed (for itching or redness).   Yes [provider]  pioglitazone (ACTOS) 15 MG tablet Take 1 tablet (15 mg total) by mouth daily. 05/26/21  Yes [name], MD  sertraline (ZOLOFT) 50 MG tablet TAKE 1 TABLET BY MOUTH EVERY DAY Patient taking differently: Take 50 mg by mouth in the morning. 03/03/21  Yes [name], MD  sitaGLIPtin (JANUVIA) 100 MG tablet Take 1 tablet (100 mg total) by mouth daily. 09/28/20  Yes [name], MD  trandolapril (MAVIK) 4 MG tablet Take 1 tablet (4 mg total) by mouth daily. 09/28/20  Yes [name], MD  triamcinolone (NASACORT) 55 MCG/ACT AERO nasal inhaler Place 2 sprays into the nose daily. Patient taking differently: Place 2 sprays into the nose See admin instructions. Instill 2 sprays into each nostril at bedtime when not using Flonase 03/28/20  Yes [name], MD  vitamin B-12 (CYANOCOBALAMIN) 1000 MCG tablet Take 1,000 mcg by mouth daily.    Yes [provider]  Blood Glucose  Monitoring Suppl (ONE TOUCH ULTRA 2) w/Device KIT Check blood sugar once daily and as directed. Dx E11.9 08/27/18   Tower, Wynelle Fanny, MD  furosemide (LASIX) 20 MG tablet TAKE 1/2 TABLET BY MOUTH EVERY DAY Patient not taking: No sig reported 06/01/21   Chesley Mires, MD  OneTouch Delica Lancets 28J MISC CHECK BLOOD SUGAR ONCE DAILY AND AS DIRECTED 05/04/21   [name], MD  ONETOUCH ULTRA test strip CHECK BLOOD SUGAR ONCE DAILY AND AS DIRECTED. DX E11.9 12/27/20   [name], MD    Physical Exam: Vitals:   06/23/21 1805 06/23/21 1815 06/23/21 1830 06/23/21 2014  BP: (!) 177/76  (!) 172/148 (!) 166/83  Pulse: 65 94 66 71  Resp: 20 20 (!) 21 12  Temp:    98.4 F (36.9 C)  TempSrc:    Oral  SpO2: 100% (!) 84% 96% 91%  Weight:      Height:       Physical Exam Constitutional:      General: She is not in acute distress.    Appearance: Normal appearance.  HENT:     Head: Normocephalic and atraumatic.     Mouth/Throat:     Mouth: Mucous membranes are moist.     Pharynx: Oropharynx is clear.  Eyes:     Extraocular Movements: Extraocular movements intact.     Pupils: Pupils are equal, round, and reactive to light.  Cardiovascular:     Rate and Rhythm: Normal rate and regular rhythm.     Pulses: Normal pulses.     Heart sounds: Normal heart sounds.  Pulmonary:     Effort: Pulmonary effort is normal. No respiratory distress.     Breath sounds: Wheezing (diffuse) and rhonchi present.  Abdominal:     General: Bowel sounds are normal. There is no distension.     Palpations: Abdomen is soft.     Tenderness: There is no abdominal tenderness.  Musculoskeletal:        General: No swelling or deformity.  Skin:    General: Skin is  warm and dry.  Neurological:     General: No focal deficit present.     Mental Status: Mental status is at baseline.   Labs on Admission: I have personally reviewed following labs and imaging studies  CBC: Recent Labs  Lab 06/19/21 1739  06/23/21 1604  WBC 7.3 4.7  NEUTROABS 5.4 3.0  HGB 11.8* 11.6*  HCT 37.8 37.3  MCV 90.9 89.9  PLT 152 675    Basic Metabolic Panel: Recent Labs  Lab 06/19/21 1739 06/23/21 1354  NA 141 141  K 4.0 4.1  CL 107 109  CO2 29 27  GLUCOSE 197* 280*  BUN 27* 26*  CREATININE 1.30* 1.49*  CALCIUM 8.9 8.3*    GFR: Estimated Creatinine Clearance: 24.4 mL/min (A) (by C-G formula based on SCr of 1.49 mg/dL (H)).  Liver Function Tests: Recent Labs  Lab 06/19/21 1739 06/23/21 1354  AST 17 18  ALT 15 14  ALKPHOS 73 61  BILITOT 0.8 0.3  PROT 6.3* 5.4*  ALBUMIN 3.9 3.2*    Urine analysis:    Component Value Date/Time   COLORURINE AMBER (A) 05/24/2021 1747   APPEARANCEUR HAZY (A) 05/24/2021 1747   LABSPEC 1.031 (H) 05/24/2021 1747   PHURINE 5.0 05/24/2021 1747   GLUCOSEU NEGATIVE 05/24/2021 1747   HGBUR NEGATIVE 05/24/2021 1747   HGBUR negative 03/27/2010 [place] 05/24/2021 1747   BILIRUBINUR Negative 08/31/2020 1428   KETONESUR 5 (A) 05/24/2021 1747   PROTEINUR 100 (A) 05/24/2021 1747   UROBILINOGEN 2.0 (H) 02/17/2021 1229   NITRITE NEGATIVE 05/24/2021 1747   LEUKOCYTESUR NEGATIVE 05/24/2021 1747    Radiological Exams on Admission: DG Chest 2 View  Result Date: 06/23/2021 CLINICAL DATA:  One week history of cough, congestion, shortness of breath and wheezing. EXAM: CHEST - 2 VIEW COMPARISON:  06/19/2021. FINDINGS: Cardiac silhouette is mildly enlarged. No mediastinal or hilar masses or evidence of adenopathy. Clear lungs.  No pleural effusion or pneumothorax. Skeletal structures are intact. IMPRESSION: No acute cardiopulmonary disease. Electronically Signed   By: Lajean Manes M.D.   On: 06/23/2021 14:42    EKG: Independently reviewed.  Sinus bradycardia at 59 bpm.  Nonspecific T wave flattening with component of low voltage.  Assessment/Plan Principal Problem:   Acute respiratory failure with hypoxia (HCC) Active Problems:   Type 2 diabetes,  controlled, with retinopathy ([place])   Essential hypertension   Anxiety and depression   Sleep apnea   History of CVA (cerebrovascular accident)   CKD (chronic kidney disease) stage 4, GFR 15-29 ml/min (HCC)  Acute respiratory failure with hypoxia > Patient saturating around 90% at her PCP office in the low 90s here at rest on room air.  Did desaturate into the mid 80s on ambulation or other exertion. > No history in the chart of asthma or COPD as prescribed a rescue inhaler as needed.  Was seen for pleural effusion in the past. > Seen at PCP earlier today sent to the ED for concern for possible pneumonia due to subjective fever, rhonchi, wheezing, and conservative approach due to patient's advanced age and comorbidities. > Chest x-ray in the ED here read as no acute disease.  Though image is rotated. > No leukocytosis in the ED nor fever thus far. > BNP only mildly elevated to 141 and this is decreased from when she left without being seen from the ED on October 31. > We will treat as COPD exacerbation for now and check for viral etiology and check procalcitonin. >  Not responding to initial therapy and pneumonia/viral cause ruled out, may benefit from further imaging. - Monitor on telemetry - Oxygen therapy, wean as tolerated - Continue with daily prednisone - Continue scheduled DuoNebs and as needed albuterol - Check respiratory viral panel - Procalcitonin - Azithromycin considering significant sputum production  OSA - No longer on CPAP  CKD 4 > Creatinine stable at 1.49 - Avoid nephrotoxic agents - Trend renal function and electrolytes  Diabetes > On multiple oral medications outpatient - SSI   Hypertension - Continue home amlodipine, hydralazine, metoprolol, trandolapril   Anxiety Depression - Continue home sertraline, Xanax, Remeron  History of CVA > Has a degree of residual left hemiparesis - Continue home aspirin   DVT prophylaxis: Lovenox  Code Status:   DNR   Family Communication:  Attempted to contact sister per patient request however there was no answer.  Per chart review, her caregiver was with her earlier in the day. Disposition Plan:   Patient is from:  Home  Anticipated DC to:  Home  Anticipated DC date:  1 to 3 days  Anticipated DC barriers: None  Consults called:  None  Admission status:  Observation, telemetry  Severity of Illness: The appropriate patient status for this patient is OBSERVATION. Observation status is judged to be reasonable and necessary in order to provide the required intensity of service to ensure the patient's safety. The patient's presenting symptoms, physical exam findings, and initial radiographic and laboratory data in the context of their medical condition is felt to place them at decreased risk for further clinical deterioration. Furthermore, it is anticipated that the patient will be medically stable for discharge from the hospital within 2 midnights of admission.    Marcelyn Bruins MD [facility]  How to contact the [facility] Attending or Consulting provider [place] or covering provider during after hours [place], for this patient?   Check the care team in [facility] and look for a) attending/consulting [facility] provider listed and b) the [facility] team listed Log into [email or web address] and use [place]'s universal password to access. If you do not have the password, please contact the hospital operator. Locate the [facility] provider you are looking for under [facility] and page to a number that you can be directly reached. If you still have difficulty reaching the provider, please page the [facility] (Director on Call) for the Hospitalists listed on amion for assistance.  06/23/2021, 9:57 PM

## 2021-06-24 ENCOUNTER — Encounter (HOSPITAL_COMMUNITY): Payer: Self-pay | Admitting: Internal Medicine

## 2021-06-24 DIAGNOSIS — J9601 Acute respiratory failure with hypoxia: Secondary | ICD-10-CM | POA: Diagnosis not present

## 2021-06-24 LAB — MAGNESIUM: Magnesium: 1.8 mg/dL (ref 1.7–2.4)

## 2021-06-24 LAB — PROCALCITONIN: Procalcitonin: <0.1 ng/mL

## 2021-06-24 LAB — RESPIRATORY PANEL BY PCR

## 2021-06-24 LAB — CBC
HCT: 30.8 % — ABNORMAL LOW (ref 36.0–46.0)
Hemoglobin: 9.9 g/dL — ABNORMAL LOW (ref 12.0–15.0)
MCH: 28.4 pg (ref 26.0–34.0)
MCHC: 32.1 g/dL (ref 30.0–36.0)
MCV: 88.3 fL (ref 80.0–100.0)
Platelets: 160 10*3/uL (ref 150–400)
RBC: 3.49 MIL/uL — ABNORMAL LOW (ref 3.87–5.11)
RDW: 13.9 % (ref 11.5–15.5)
WBC: 4.5 10*3/uL (ref 4.0–10.5)
nRBC: 0 % (ref 0.0–0.2)

## 2021-06-24 LAB — COMPREHENSIVE METABOLIC PANEL
ALT: 13 U/L (ref 0–44)
AST: 13 U/L — ABNORMAL LOW (ref 15–41)
Albumin: 2.9 g/dL — ABNORMAL LOW (ref 3.5–5.0)
Alkaline Phosphatase: 56 U/L (ref 38–126)
Anion gap: 6 (ref 5–15)
BUN: 26 mg/dL — ABNORMAL HIGH (ref 8–23)
CO2: 25 mmol/L (ref 22–32)
Calcium: 8.3 mg/dL — ABNORMAL LOW (ref 8.9–10.3)
Chloride: 107 mmol/L (ref 98–111)
Creatinine, Ser: 1.35 mg/dL — ABNORMAL HIGH (ref 0.44–1.00)
GFR, Estimated: 37 mL/min — ABNORMAL LOW (ref >60)
Glucose, Bld: 320 mg/dL — ABNORMAL HIGH (ref 70–99)
Potassium: 4 mmol/L (ref 3.5–5.1)
Sodium: 138 mmol/L (ref 135–145)
Total Bilirubin: 0.3 mg/dL (ref 0.3–1.2)
Total Protein: 5 g/dL — ABNORMAL LOW (ref 6.5–8.1)

## 2021-06-24 LAB — GLUCOSE, CAPILLARY
Glucose-Capillary: 300 mg/dL — ABNORMAL HIGH (ref 70–99)
Glucose-Capillary: 289 mg/dL — ABNORMAL HIGH (ref 70–99)
Glucose-Capillary: 274 mg/dL — ABNORMAL HIGH (ref 70–99)
Glucose-Capillary: 279 mg/dL — ABNORMAL HIGH (ref 70–99)

## 2021-06-24 MED ORDER — SODIUM CHLORIDE 0.9 % IV SOLN: INTRAVENOUS | Status: DC | PRN

## 2021-06-24 MED ORDER — LINAGLIPTIN 5 MG PO TABS
5.0000 mg | ORAL_TABLET | Freq: Every day | ORAL | Status: DC
  Administered 2021-06-24 – 2021-06-26 (×3): 5 mg via ORAL
  Filled 2021-06-24 (×3): qty 1

## 2021-06-24 MED ORDER — GLIPIZIDE ER 10 MG PO TB24
10.0000 mg | ORAL_TABLET | Freq: Every day | ORAL | Status: DC
  Administered 2021-06-25 – 2021-06-26 (×2): 10 mg via ORAL
  Filled 2021-06-24 (×2): qty 1

## 2021-06-24 MED ORDER — GUAIFENESIN ER 600 MG PO TB12
1200.0000 mg | ORAL_TABLET | Freq: Two times a day (BID) | ORAL | Status: DC
  Administered 2021-06-24 – 2021-06-26 (×5): 1200 mg via ORAL
  Filled 2021-06-24 (×5): qty 2

## 2021-06-24 NOTE — Progress Notes (Signed)
Patient arrived to unit, VSS, no complaints from patient. Will continue to assess.

## 2021-06-24 NOTE — Progress Notes (Signed)
Mobility Specialist Progress Note:  06/24/21 1141  Mobility  Activity Ambulated in hall  Level of Assistance Standby assist, set-up cues, supervision of patient - no hands on  Assistive Device Front wheel walker  Distance Ambulated (ft) 96 ft  Mobility Ambulated with assistance in hallway  Mobility Response Tolerated well  Mobility performed by Mobility specialist  $Mobility charge 1 Mobility   SATURATION QUALIFICATIONS: (This note is used to comply with regulatory documentation for home oxygen)  Patient Saturations on [place] at Rest = 94%  Patient Saturations on [place] while Ambulating = 95%  Patient Saturations on 0 Liters of oxygen while Ambulating = n/a%  Pt received in bed willing to participate in mobility. Pt requesting to use bathroom, able to have BM. Pt returned to bed with call bell in reach and all needs met.   [facility] and safety inspector Phone [phone]

## 2021-06-24 NOTE — Progress Notes (Signed)
Progress Note    Renee [name]  ZYS:[id number] DOB: 02-28-1929  DOA: 06/23/2021 PCP: [name], MD    Brief Narrative:    Medical records reviewed and are as summarized below:  Renee [name] is an 87 y.o. female with medical history significant of anxiety, depression, CKD 4, diabetes, hypertension, fibrotic wrist disease, weakness, history of CVA with residual left hemiparesis, hemorrhoids, hyperlipidemia, pleural effusion presenting with 1 week of a myriad of symptoms including cough shortness of breath and wheezing.  Found to be + for rhinovirus.  Assessment/Plan:   Principal Problem:   Acute respiratory failure with hypoxia (HCC) Active Problems:   Type 2 diabetes, controlled, with retinopathy ([place])   Essential hypertension   Anxiety and depression   Sleep apnea   History of CVA (cerebrovascular accident)   CKD (chronic kidney disease) stage 4, GFR 15-29 ml/min (HCC)   Acute respiratory failure with hypoxia due to rhinovirus > Patient saturating around 90% at her PCP office in the low 90s here at rest on room air.  Did desaturate into the mid 80s on ambulation or other exertion. > No history in the chart of asthma or COPD as prescribed a rescue inhaler as needed.  Was seen for pleural effusion in the past. > Chest x-ray in the ED here read as no acute disease. -procalcitonin negative > No leukocytosis in the ED nor fever thus far. - Oxygen therapy, wean as tolerated- home O2 study - Continue with daily prednisone - Continue scheduled DuoNebs and as needed albuterol  -needs aggressive pulm toilet  OSA - No longer on CPAP   CKD 4 > Creatinine stable at 1.49 - Avoid nephrotoxic agents - Trend renal function and electrolytes  Diabetes > On multiple oral medications outpatient - SSI    Hypertension - Continue home amlodipine, hydralazine, metoprolol, trandolapril    Anxiety Depression - Continue home sertraline, Xanax, Remeron  History of CVA > Has  a degree of residual left hemiparesis - Continue home aspirin    obesity Body mass index is 30.97 kg/m.   Family Communication/Anticipated D/C date and plan/Code Status   DVT prophylaxis: Lovenox ordered. Code Status: DNR Disposition Plan: Status is: Observation  The patient remains OBS appropriate and will d/c before 2 midnights.  If not on O2 plan to d/c home in AM      Medical Consultants:   None.    Subjective:  Coughing up sputum  Objective:    Vitals:   06/24/21 0341 06/24/21 0745 06/24/21 0758 06/24/21 1158  BP: (!) 157/61 (!) 163/68  (!) 167/66  Pulse: 77 69  72  Resp: (!) 24 (!) 21  20  Temp: 98.4 F (36.9 C) 98.4 F (36.9 C)  99.1 F (37.3 C)  TempSrc: Oral Oral  Oral  SpO2: 95% 95% 94% 92%  Weight: 79.3 kg     Height: 5' 3"  (1.6 m)       Intake/Output Summary (Last 24 hours) at 06/24/2021 1434 Last data filed at 06/24/2021 1300 Gross per 24 hour  Intake 700 ml  Output 100 ml  Net 600 ml   Filed Weights   06/23/21 1505 06/24/21 0341  Weight: 78.9 kg 79.3 kg    Exam:  General: Appearance:    Obese female in no acute distress     Lungs:     Rhonchi b/l  Heart:    Normal heart rate.    MS:   All extremities are intact.    Neurologic:  Awake, alert, oriented x 3. No apparent focal neurological           defect.      Data Reviewed:   I have personally reviewed following labs and imaging studies:  Labs: Labs show the following:   Basic Metabolic Panel: Recent Labs  Lab 06/19/21 1739 06/23/21 1354 06/24/21 0205 06/24/21 0510  NA 141 141  --  138  K 4.0 4.1  --  4.0  CL 107 109  --  107  CO2 29 27  --  25  GLUCOSE 197* 280*  --  320*  BUN 27* 26*  --  26*  CREATININE 1.30* 1.49*  --  1.35*  CALCIUM 8.9 8.3*  --  8.3*  MG  --   --  1.8  --    GFR Estimated Creatinine Clearance: 27.6 mL/min (A) (by C-G formula based on SCr of 1.35 mg/dL (H)). Liver Function Tests: Recent Labs  Lab 06/19/21 1739 06/23/21 1354  06/24/21 0510  AST 17 18 13*  ALT 15 14 13   ALKPHOS 73 61 56  BILITOT 0.8 0.3 0.3  PROT 6.3* 5.4* 5.0*  ALBUMIN 3.9 3.2* 2.9*   No results for input(s): LIPASE, AMYLASE in the last 168 hours. No results for input(s): AMMONIA in the last 168 hours. Coagulation profile No results for input(s): INR, PROTIME in the last 168 hours.  CBC: Recent Labs  Lab 06/19/21 1739 06/23/21 1604 06/24/21 0510  WBC 7.3 4.7 4.5  NEUTROABS 5.4 3.0  --   HGB 11.8* 11.6* 9.9*  HCT 37.8 37.3 30.8*  MCV 90.9 89.9 88.3  PLT 152 173 160   Cardiac Enzymes: No results for input(s): CKTOTAL, CKMB, CKMBINDEX, TROPONINI in the last 168 hours. BNP (last 3 results) No results for input(s): PROBNP in the last 8760 hours. CBG: Recent Labs  Lab 06/23/21 2318 06/24/21 0616 06/24/21 1203  GLUCAP 361* 300* 289*   D-Dimer: No results for input(s): DDIMER in the last 72 hours. Hgb A1c: No results for input(s): HGBA1C in the last 72 hours. Lipid Profile: No results for input(s): CHOL, HDL, LDLCALC, TRIG, CHOLHDL, LDLDIRECT in the last 72 hours. Thyroid function studies: No results for input(s): TSH, T4TOTAL, T3FREE, THYROIDAB in the last 72 hours.  Invalid input(s): FREET3 Anemia work up: No results for input(s): VITAMINB12, FOLATE, FERRITIN, TIBC, IRON, RETICCTPCT in the last 72 hours. Sepsis Labs: Recent Labs  Lab 06/19/21 1739 06/23/21 1604 06/24/21 0205 06/24/21 0510  PROCALCITON  --   --  <0.10  --   WBC 7.3 4.7  --  4.5    Microbiology Recent Results (from the past 240 hour(s))  Resp Panel by RT-PCR (Flu A&B, Covid) Nasopharyngeal Swab     Status: None   Collection Time: 06/19/21  9:00 PM   Specimen: Nasopharyngeal Swab; Nasopharyngeal(NP) swabs in vial transport medium  Result Value Ref Range Status   SARS Coronavirus 2 by RT PCR NEGATIVE NEGATIVE Final    Comment: (NOTE) SARS-CoV-2 target nucleic acids are NOT DETECTED.  The SARS-CoV-2 RNA is generally detectable in upper  respiratory specimens during the acute phase of infection. The lowest concentration of SARS-CoV-2 viral copies this assay can detect is 138 copies/mL. A negative result does not preclude SARS-Cov-2 infection and should not be used as the sole basis for treatment or other patient management decisions. A negative result may occur with  improper specimen collection/handling, submission of specimen other than nasopharyngeal swab, presence of viral mutation(s) within the areas targeted by this assay,  and inadequate number of viral copies(<138 copies/mL). A negative result must be combined with clinical observations, patient history, and epidemiological information. The expected result is Negative.  Fact Sheet for Patients:  EntrepreneurPulse.com.au  Fact Sheet for Healthcare Providers:  IncredibleEmployment.be  This test is no t yet approved or cleared by the [place] FDA and  has been authorized for detection and/or diagnosis of SARS-CoV-2 by FDA under an Emergency Use Authorization (EUA). This EUA will remain  in effect (meaning this test can be used) for the duration of the COVID-19 declaration under Section 564(b)(1) of the Act, 21 U.S.C.section 360bbb-3(b)(1), unless the authorization is terminated  or revoked sooner.       Influenza A by PCR NEGATIVE NEGATIVE Final   Influenza B by PCR NEGATIVE NEGATIVE Final    Comment: (NOTE) The Xpert Xpress SARS-CoV-2/FLU/RSV plus assay is intended as an aid in the diagnosis of influenza from Nasopharyngeal swab specimens and should not be used as a sole basis for treatment. Nasal washings and aspirates are unacceptable for Xpert Xpress SARS-CoV-2/FLU/RSV testing.  Fact Sheet for Patients: EntrepreneurPulse.com.au  Fact Sheet for Healthcare Providers: IncredibleEmployment.be  This test is not yet approved or cleared by the [place] FDA and has been  authorized for detection and/or diagnosis of SARS-CoV-2 by FDA under an Emergency Use Authorization (EUA). This EUA will remain in effect (meaning this test can be used) for the duration of the COVID-19 declaration under Section 564(b)(1) of the Act, 21 U.S.C. section 360bbb-3(b)(1), unless the authorization is terminated or revoked.  Performed at [facility], [address] [place], [place] [address]   Resp Panel by RT-PCR (Flu A&B, Covid) Nasopharyngeal Swab     Status: None   Collection Time: 06/23/21  1:56 PM   Specimen: Nasopharyngeal Swab; Nasopharyngeal(NP) swabs in vial transport medium  Result Value Ref Range Status   SARS Coronavirus 2 by RT PCR NEGATIVE NEGATIVE Final    Comment: (NOTE) SARS-CoV-2 target nucleic acids are NOT DETECTED.  The SARS-CoV-2 RNA is generally detectable in upper respiratory specimens during the acute phase of infection. The lowest concentration of SARS-CoV-2 viral copies this assay can detect is 138 copies/mL. A negative result does not preclude SARS-Cov-2 infection and should not be used as the sole basis for treatment or other patient management decisions. A negative result may occur with  improper specimen collection/handling, submission of specimen other than nasopharyngeal swab, presence of viral mutation(s) within the areas targeted by this assay, and inadequate number of viral copies(<138 copies/mL). A negative result must be combined with clinical observations, patient history, and epidemiological information. The expected result is Negative.  Fact Sheet for Patients:  EntrepreneurPulse.com.au  Fact Sheet for Healthcare Providers:  IncredibleEmployment.be  This test is no t yet approved or cleared by the [place] FDA and  has been authorized for detection and/or diagnosis of SARS-CoV-2 by FDA under an Emergency Use Authorization (EUA). This EUA will remain  in effect (meaning this  test can be used) for the duration of the COVID-19 declaration under Section 564(b)(1) of the Act, 21 U.S.C.section 360bbb-3(b)(1), unless the authorization is terminated  or revoked sooner.       Influenza A by PCR NEGATIVE NEGATIVE Final   Influenza B by PCR NEGATIVE NEGATIVE Final    Comment: (NOTE) The Xpert Xpress SARS-CoV-2/FLU/RSV plus assay is intended as an aid in the diagnosis of influenza from Nasopharyngeal swab specimens and should not be used as a sole basis for treatment. Nasal washings and  aspirates are unacceptable for Xpert Xpress SARS-CoV-2/FLU/RSV testing.  Fact Sheet for Patients: EntrepreneurPulse.com.au  Fact Sheet for Healthcare Providers: IncredibleEmployment.be  This test is not yet approved or cleared by the [place] FDA and has been authorized for detection and/or diagnosis of SARS-CoV-2 by FDA under an Emergency Use Authorization (EUA). This EUA will remain in effect (meaning this test can be used) for the duration of the COVID-19 declaration under Section 564(b)(1) of the Act, 21 U.S.C. section 360bbb-3(b)(1), unless the authorization is terminated or revoked.  Performed at [facility], [place] [address] [place], [place] [address]   Respiratory (~20 pathogens) panel by PCR     Status: Abnormal   Collection Time: 06/23/21  9:42 PM   Specimen: Nasopharyngeal Swab; Respiratory  Result Value Ref Range Status   Adenovirus NOT DETECTED NOT DETECTED Final   Coronavirus 229E NOT DETECTED NOT DETECTED Final    Comment: (NOTE) The Coronavirus on the Respiratory Panel, DOES NOT test for the novel  Coronavirus (2019 nCoV)    Coronavirus HKU1 NOT DETECTED NOT DETECTED Final   Coronavirus NL63 NOT DETECTED NOT DETECTED Final   Coronavirus OC43 NOT DETECTED NOT DETECTED Final   Metapneumovirus NOT DETECTED NOT DETECTED Final   Rhinovirus / Enterovirus DETECTED (A) NOT DETECTED Final   Influenza A NOT DETECTED  NOT DETECTED Final   Influenza B NOT DETECTED NOT DETECTED Final   Parainfluenza Virus 1 NOT DETECTED NOT DETECTED Final   Parainfluenza Virus 2 NOT DETECTED NOT DETECTED Final   Parainfluenza Virus 3 NOT DETECTED NOT DETECTED Final   Parainfluenza Virus 4 NOT DETECTED NOT DETECTED Final   Respiratory Syncytial Virus NOT DETECTED NOT DETECTED Final   Bordetella pertussis NOT DETECTED NOT DETECTED Final   Bordetella Parapertussis NOT DETECTED NOT DETECTED Final   Chlamydophila pneumoniae NOT DETECTED NOT DETECTED Final   Mycoplasma pneumoniae NOT DETECTED NOT DETECTED Final    Comment: Performed at [facility], [place]. [address], [place], [place] [address]    Procedures and diagnostic studies:  DG Chest 2 View  Result Date: 06/23/2021 CLINICAL DATA:  One week history of cough, congestion, shortness of breath and wheezing. EXAM: CHEST - 2 VIEW COMPARISON:  06/19/2021. FINDINGS: Cardiac silhouette is mildly enlarged. No mediastinal or hilar masses or evidence of adenopathy. Clear lungs.  No pleural effusion or pneumothorax. Skeletal structures are intact. IMPRESSION: No acute cardiopulmonary disease. Electronically Signed   By: Lajean Manes M.D.   On: 06/23/2021 14:42    Medications:    amLODipine  10 mg Oral Daily   aspirin EC  325 mg Oral Daily   docusate sodium  200 mg Oral QHS   enoxaparin (LOVENOX) injection  30 mg Subcutaneous Q24H   guaiFENesin  1,200 mg Oral BID   hydrALAZINE  100 mg Oral TID   insulin aspart  0-9 Units Subcutaneous TID WC   metoprolol succinate  12.5 mg Oral BID   mirtazapine  15 mg Oral QHS   predniSONE  40 mg Oral Q breakfast   sertraline  50 mg Oral Daily   sodium chloride flush  3 mL Intravenous Q12H   trandolapril  4 mg Oral Daily   Continuous Infusions:  sodium chloride     cefTRIAXone (ROCEPHIN)  IV Stopped (06/24/21 0259)     LOS: 0 days   [name]  [facility]   How to contact the [facility] Attending or Consulting provider [place] or covering provider during after hours  7P -7A, for this patient?  Check the care team in [facility] and look for a) attending/consulting [facility] provider listed and b) the [facility] team listed Log into [email or web address] and use [place]'s universal password to access. If you do not have the password, please contact the hospital operator. Locate the [facility] provider you are looking for under [facility] and page to a number that you can be directly reached. If you still have difficulty reaching the provider, please page the [facility] (Director on Call) for the Hospitalists listed on amion for assistance.  06/24/2021, 2:34 PM

## 2021-06-24 NOTE — Progress Notes (Signed)
Patient arrived on unit from ED. No complaints of pain or discomfort. Patient eating a sandwich. Patient having difficulty hearing due to hearing aide battery being dead. Patient was placed on a purewick for urinary purposes. Patient currently on Room Air with saturation reading 95%. Patient does have wheezing on expirations. Patient was placed on continuous telemetry on box 3e06. Patient oriented to room. Patient stated they believe last bowel movement was on Wednesday 06/21/21. Patient states they live alone but does have an individual that comes and help them. Patient states they feel safe at home. Will continue to monitor patient.

## 2021-06-24 NOTE — Plan of Care (Signed)
  Problem: Education: Goal: Knowledge of General Education information will improve Description: Including pain rating scale, medication(s)/side effects and non-pharmacologic comfort measures Outcome: Progressing   Problem: Health Behavior/Discharge Planning: Goal: Ability to manage health-related needs will improve Outcome: Progressing   Problem: Clinical Measurements: Goal: Ability to maintain clinical measurements within normal limits will improve Outcome: Progressing   Problem: Clinical Measurements: Goal: Will remain free from infection Outcome: Progressing   Problem: Clinical Measurements: Goal: Respiratory complications will improve Outcome: Progressing   Problem: Clinical Measurements: Goal: Cardiovascular complication will be avoided Outcome: Progressing   Problem: Activity: Goal: Risk for activity intolerance will decrease Outcome: Progressing   Problem: Elimination: Goal: Will not experience complications related to bowel motility Outcome: Progressing   Problem: Elimination: Goal: Will not experience complications related to urinary retention Outcome: Progressing   Problem: Skin Integrity: Goal: Risk for impaired skin integrity will decrease Outcome: Progressing   Problem: Respiratory: Goal: Ability to maintain a clear airway will improve Outcome: Progressing   Problem: Respiratory: Goal: Levels of oxygenation will improve Outcome: Progressing   Problem: Respiratory: Goal: Ability to maintain adequate ventilation will improve Outcome: Progressing   Problem: Cardiac: Goal: Ability to achieve and maintain adequate cardiopulmonary perfusion will improve Outcome: Progressing

## 2021-06-25 DIAGNOSIS — J9601 Acute respiratory failure with hypoxia: Secondary | ICD-10-CM | POA: Diagnosis not present

## 2021-06-25 LAB — GLUCOSE, CAPILLARY
Glucose-Capillary: 178 mg/dL — ABNORMAL HIGH (ref 70–99)
Glucose-Capillary: 202 mg/dL — ABNORMAL HIGH (ref 70–99)
Glucose-Capillary: 242 mg/dL — ABNORMAL HIGH (ref 70–99)
Glucose-Capillary: 255 mg/dL — ABNORMAL HIGH (ref 70–99)

## 2021-06-25 MED ORDER — MAGNESIUM SULFATE 2 GM/50ML IV SOLN
2.0000 g | Freq: Once | INTRAVENOUS | Status: AC
  Administered 2021-06-25: 2 g via INTRAVENOUS
  Filled 2021-06-25: qty 50

## 2021-06-25 MED ORDER — INSULIN GLARGINE-YFGN 100 UNIT/ML ~~LOC~~ SOLN
10.0000 [IU] | Freq: Every day | SUBCUTANEOUS | Status: DC
  Administered 2021-06-25 – 2021-06-26 (×2): 10 [IU] via SUBCUTANEOUS
  Filled 2021-06-25 (×2): qty 0.1

## 2021-06-25 MED ORDER — IPRATROPIUM-ALBUTEROL 0.5-2.5 (3) MG/3ML IN SOLN
3.0000 mL | RESPIRATORY_TRACT | Status: DC
  Administered 2021-06-25 – 2021-06-26 (×7): 3 mL via RESPIRATORY_TRACT
  Filled 2021-06-25 (×5): qty 3

## 2021-06-25 MED ORDER — INSULIN ASPART 100 UNIT/ML IJ SOLN
0.0000 [IU] | Freq: Three times a day (TID) | INTRAMUSCULAR | Status: DC
  Administered 2021-06-25 (×2): 7 [IU] via SUBCUTANEOUS
  Administered 2021-06-26: 4 [IU] via SUBCUTANEOUS
  Administered 2021-06-26: 11 [IU] via SUBCUTANEOUS

## 2021-06-25 MED ORDER — HYDRALAZINE HCL 20 MG/ML IJ SOLN: 10.0000 mg | Freq: Four times a day (QID) | INTRAMUSCULAR | Status: DC | PRN

## 2021-06-25 MED ORDER — INSULIN ASPART 100 UNIT/ML IJ SOLN
0.0000 [IU] | Freq: Every day | INTRAMUSCULAR | Status: DC
  Administered 2021-06-25: 2 [IU] via SUBCUTANEOUS

## 2021-06-25 NOTE — Progress Notes (Signed)
Progress Note    Renee [name]  ERD:[id number] DOB: 15-Nov-1928  DOA: 06/23/2021 PCP: [name], MD    Brief Narrative:    Medical records reviewed and are as summarized below:  Renee [name] is an 87 y.o. female with medical history significant of anxiety, depression, CKD 4, diabetes, hypertension, fibrotic wrist disease, weakness, history of CVA with residual left hemiparesis, hemorrhoids, hyperlipidemia, pleural effusion presenting with 1 week of a myriad of symptoms including cough shortness of breath and wheezing.  Found to be + for rhinovirus.   Assessment/Plan:   Principal Problem:   Acute respiratory failure with hypoxia (HCC) Active Problems:   Type 2 diabetes, controlled, with retinopathy ([place])   Essential hypertension   Anxiety and depression   Sleep apnea   History of CVA (cerebrovascular accident)   CKD (chronic kidney disease) stage 4, GFR 15-29 ml/min (HCC)   Acute respiratory failure with hypoxia due to rhinovirus > Patient saturating around 90% at her PCP office in the low 90s here at rest on room air.  Did desaturate into the mid 80s on ambulation or other exertion. > No history in the chart of asthma or COPD as prescribed a rescue inhaler as needed.  Was seen for pleural effusion in the past. > Chest x-ray in the ED here read as no acute disease. -procalcitonin negative > No leukocytosis in the ED nor fever thus far. - Oxygen therapy, wean as tolerated- home O2 study is ok - Continue with daily prednisone - Continue scheduled DuoNebs and as needed albuterol  -needs aggressive pulm toilet- unfortunately was not started on 11/5 when ordered but today's nurse has given her a flutter valve and had ambulated  OSA - No longer on CPAP   CKD 4 > Creatinine stable at 1.49 - Avoid nephrotoxic agents - Trend renal function and electrolytes  Diabetes > On multiple oral medications outpatient- while on steroids will be harder to control - SSI     Hypertension - Continue home amlodipine, hydralazine, metoprolol, trandolapril    Anxiety Depression - Continue home sertraline, Xanax, Remeron  History of CVA > Has a degree of residual left hemiparesis - Continue home aspirin    obesity Body mass index is 31.48 kg/m.   Family Communication/Anticipated D/C date and plan/Code Status   DVT prophylaxis: Lovenox ordered. Code Status: DNR Disposition Plan: Status is: Observation  The patient remains OBS appropriate and will d/c before 2 midnights.  If not on O2 plan to d/c home in AM      Medical Consultants:   None.    Subjective:   Wheezing and coughing  Objective:    Vitals:   06/25/21 0014 06/25/21 0406 06/25/21 0811 06/25/21 1154  BP: (!) 153/67 (!) 174/63 (!) 187/72   Pulse: 64 (!) 59 63   Resp: 19 18    Temp: 98.6 F (37 C) 97.8 F (36.6 C)    TempSrc: Oral Oral    SpO2: 94% 95% 96% 97%  Weight:  80.6 kg    Height:        Intake/Output Summary (Last 24 hours) at 06/25/2021 1327 Last data filed at 06/25/2021 0411 Gross per 24 hour  Intake 360 ml  Output 425 ml  Net -65 ml   Filed Weights   06/23/21 1505 06/24/21 0341 06/25/21 0406  Weight: 78.9 kg 79.3 kg 80.6 kg    Exam:   General: Appearance:    Obese female in no acute distress  Lungs:     Tight breath sounds, rhonchi with wheezing- better with coughing  Heart:    Normal heart rate.    MS:   All extremities are intact.    Neurologic:   Awake, alert, oriented x 3       Data Reviewed:   I have personally reviewed following labs and imaging studies:  Labs: Labs show the following:   Basic Metabolic Panel: Recent Labs  Lab 06/19/21 1739 06/23/21 1354 06/24/21 0205 06/24/21 0510  NA 141 141  --  138  K 4.0 4.1  --  4.0  CL 107 109  --  107  CO2 29 27  --  25  GLUCOSE 197* 280*  --  320*  BUN 27* 26*  --  26*  CREATININE 1.30* 1.49*  --  1.35*  CALCIUM 8.9 8.3*  --  8.3*  MG  --   --  1.8  --    GFR Estimated  Creatinine Clearance: 27.9 mL/min (A) (by C-G formula based on SCr of 1.35 mg/dL (H)). Liver Function Tests: Recent Labs  Lab 06/19/21 1739 06/23/21 1354 06/24/21 0510  AST 17 18 13*  ALT 15 14 13   ALKPHOS 73 61 56  BILITOT 0.8 0.3 0.3  PROT 6.3* 5.4* 5.0*  ALBUMIN 3.9 3.2* 2.9*   No results for input(s): LIPASE, AMYLASE in the last 168 hours. No results for input(s): AMMONIA in the last 168 hours. Coagulation profile No results for input(s): INR, PROTIME in the last 168 hours.  CBC: Recent Labs  Lab 06/19/21 1739 06/23/21 1604 06/24/21 0510  WBC 7.3 4.7 4.5  NEUTROABS 5.4 3.0  --   HGB 11.8* 11.6* 9.9*  HCT 37.8 37.3 30.8*  MCV 90.9 89.9 88.3  PLT 152 173 160   Cardiac Enzymes: No results for input(s): CKTOTAL, CKMB, CKMBINDEX, TROPONINI in the last 168 hours. BNP (last 3 results) No results for input(s): PROBNP in the last 8760 hours. CBG: Recent Labs  Lab 06/24/21 1203 06/24/21 1623 06/24/21 2101 06/25/21 0613 06/25/21 1147  GLUCAP 289* 274* 279* 178* 202*   D-Dimer: No results for input(s): DDIMER in the last 72 hours. Hgb A1c: No results for input(s): HGBA1C in the last 72 hours. Lipid Profile: No results for input(s): CHOL, HDL, LDLCALC, TRIG, CHOLHDL, LDLDIRECT in the last 72 hours. Thyroid function studies: No results for input(s): TSH, T4TOTAL, T3FREE, THYROIDAB in the last 72 hours.  Invalid input(s): FREET3 Anemia work up: No results for input(s): VITAMINB12, FOLATE, FERRITIN, TIBC, IRON, RETICCTPCT in the last 72 hours. Sepsis Labs: Recent Labs  Lab 06/19/21 1739 06/23/21 1604 06/24/21 0205 06/24/21 0510  PROCALCITON  --   --  <0.10  --   WBC 7.3 4.7  --  4.5    Microbiology Recent Results (from the past 240 hour(s))  Resp Panel by RT-PCR (Flu A&B, Covid) Nasopharyngeal Swab     Status: None   Collection Time: 06/19/21  9:00 PM   Specimen: Nasopharyngeal Swab; Nasopharyngeal(NP) swabs in vial transport medium  Result Value Ref  Range Status   SARS Coronavirus 2 by RT PCR NEGATIVE NEGATIVE Final    Comment: (NOTE) SARS-CoV-2 target nucleic acids are NOT DETECTED.  The SARS-CoV-2 RNA is generally detectable in upper respiratory specimens during the acute phase of infection. The lowest concentration of SARS-CoV-2 viral copies this assay can detect is 138 copies/mL. A negative result does not preclude SARS-Cov-2 infection and should not be used as the sole basis for treatment or other patient  management decisions. A negative result may occur with  improper specimen collection/handling, submission of specimen other than nasopharyngeal swab, presence of viral mutation(s) within the areas targeted by this assay, and inadequate number of viral copies(<138 copies/mL). A negative result must be combined with clinical observations, patient history, and epidemiological information. The expected result is Negative.  Fact Sheet for Patients:  EntrepreneurPulse.com.au  Fact Sheet for Healthcare Providers:  IncredibleEmployment.be  This test is no t yet approved or cleared by the [place] FDA and  has been authorized for detection and/or diagnosis of SARS-CoV-2 by FDA under an Emergency Use Authorization (EUA). This EUA will remain  in effect (meaning this test can be used) for the duration of the COVID-19 declaration under Section 564(b)(1) of the Act, 21 U.S.C.section 360bbb-3(b)(1), unless the authorization is terminated  or revoked sooner.       Influenza A by PCR NEGATIVE NEGATIVE Final   Influenza B by PCR NEGATIVE NEGATIVE Final    Comment: (NOTE) The Xpert Xpress SARS-CoV-2/FLU/RSV plus assay is intended as an aid in the diagnosis of influenza from Nasopharyngeal swab specimens and should not be used as a sole basis for treatment. Nasal washings and aspirates are unacceptable for Xpert Xpress SARS-CoV-2/FLU/RSV testing.  Fact Sheet for  Patients: EntrepreneurPulse.com.au  Fact Sheet for Healthcare Providers: IncredibleEmployment.be  This test is not yet approved or cleared by the [place] FDA and has been authorized for detection and/or diagnosis of SARS-CoV-2 by FDA under an Emergency Use Authorization (EUA). This EUA will remain in effect (meaning this test can be used) for the duration of the COVID-19 declaration under Section 564(b)(1) of the Act, 21 U.S.C. section 360bbb-3(b)(1), unless the authorization is terminated or revoked.  Performed at [facility], [address] [place], [place] [address]   Resp Panel by RT-PCR (Flu A&B, Covid) Nasopharyngeal Swab     Status: None   Collection Time: 06/23/21  1:56 PM   Specimen: Nasopharyngeal Swab; Nasopharyngeal(NP) swabs in vial transport medium  Result Value Ref Range Status   SARS Coronavirus 2 by RT PCR NEGATIVE NEGATIVE Final    Comment: (NOTE) SARS-CoV-2 target nucleic acids are NOT DETECTED.  The SARS-CoV-2 RNA is generally detectable in upper respiratory specimens during the acute phase of infection. The lowest concentration of SARS-CoV-2 viral copies this assay can detect is 138 copies/mL. A negative result does not preclude SARS-Cov-2 infection and should not be used as the sole basis for treatment or other patient management decisions. A negative result may occur with  improper specimen collection/handling, submission of specimen other than nasopharyngeal swab, presence of viral mutation(s) within the areas targeted by this assay, and inadequate number of viral copies(<138 copies/mL). A negative result must be combined with clinical observations, patient history, and epidemiological information. The expected result is Negative.  Fact Sheet for Patients:  EntrepreneurPulse.com.au  Fact Sheet for Healthcare Providers:  IncredibleEmployment.be  This test is no t  yet approved or cleared by the [place] FDA and  has been authorized for detection and/or diagnosis of SARS-CoV-2 by FDA under an Emergency Use Authorization (EUA). This EUA will remain  in effect (meaning this test can be used) for the duration of the COVID-19 declaration under Section 564(b)(1) of the Act, 21 U.S.C.section 360bbb-3(b)(1), unless the authorization is terminated  or revoked sooner.       Influenza A by PCR NEGATIVE NEGATIVE Final   Influenza B by PCR NEGATIVE NEGATIVE Final    Comment: (NOTE) The Xpert Xpress SARS-CoV-2/FLU/RSV  plus assay is intended as an aid in the diagnosis of influenza from Nasopharyngeal swab specimens and should not be used as a sole basis for treatment. Nasal washings and aspirates are unacceptable for Xpert Xpress SARS-CoV-2/FLU/RSV testing.  Fact Sheet for Patients: EntrepreneurPulse.com.au  Fact Sheet for Healthcare Providers: IncredibleEmployment.be  This test is not yet approved or cleared by the [place] FDA and has been authorized for detection and/or diagnosis of SARS-CoV-2 by FDA under an Emergency Use Authorization (EUA). This EUA will remain in effect (meaning this test can be used) for the duration of the COVID-19 declaration under Section 564(b)(1) of the Act, 21 U.S.C. section 360bbb-3(b)(1), unless the authorization is terminated or revoked.  Performed at [facility], [place] [address] [place], [place] [address]   Respiratory (~20 pathogens) panel by PCR     Status: Abnormal   Collection Time: 06/23/21  9:42 PM   Specimen: Nasopharyngeal Swab; Respiratory  Result Value Ref Range Status   Adenovirus NOT DETECTED NOT DETECTED Final   Coronavirus 229E NOT DETECTED NOT DETECTED Final    Comment: (NOTE) The Coronavirus on the Respiratory Panel, DOES NOT test for the novel  Coronavirus (2019 nCoV)    Coronavirus HKU1 NOT DETECTED NOT DETECTED Final   Coronavirus NL63 NOT  DETECTED NOT DETECTED Final   Coronavirus OC43 NOT DETECTED NOT DETECTED Final   Metapneumovirus NOT DETECTED NOT DETECTED Final   Rhinovirus / Enterovirus DETECTED (A) NOT DETECTED Final   Influenza A NOT DETECTED NOT DETECTED Final   Influenza B NOT DETECTED NOT DETECTED Final   Parainfluenza Virus 1 NOT DETECTED NOT DETECTED Final   Parainfluenza Virus 2 NOT DETECTED NOT DETECTED Final   Parainfluenza Virus 3 NOT DETECTED NOT DETECTED Final   Parainfluenza Virus 4 NOT DETECTED NOT DETECTED Final   Respiratory Syncytial Virus NOT DETECTED NOT DETECTED Final   Bordetella pertussis NOT DETECTED NOT DETECTED Final   Bordetella Parapertussis NOT DETECTED NOT DETECTED Final   Chlamydophila pneumoniae NOT DETECTED NOT DETECTED Final   Mycoplasma pneumoniae NOT DETECTED NOT DETECTED Final    Comment: Performed at [facility], [place]. [address], [place], [place] [address]    Procedures and diagnostic studies:  DG Chest 2 View  Result Date: 06/23/2021 CLINICAL DATA:  One week history of cough, congestion, shortness of breath and wheezing. EXAM: CHEST - 2 VIEW COMPARISON:  06/19/2021. FINDINGS: Cardiac silhouette is mildly enlarged. No mediastinal or hilar masses or evidence of adenopathy. Clear lungs.  No pleural effusion or pneumothorax. Skeletal structures are intact. IMPRESSION: No acute cardiopulmonary disease. Electronically Signed   By: Lajean Manes M.D.   On: 06/23/2021 14:42    Medications:    amLODipine  10 mg Oral Daily   aspirin EC  325 mg Oral Daily   docusate sodium  200 mg Oral QHS   enoxaparin (LOVENOX) injection  30 mg Subcutaneous Q24H   glipiZIDE  10 mg Oral Q breakfast   guaiFENesin  1,200 mg Oral BID   hydrALAZINE  100 mg Oral TID   insulin aspart  0-20 Units Subcutaneous TID WC   insulin aspart  0-5 Units Subcutaneous QHS   insulin glargine-yfgn  10 Units Subcutaneous Daily   ipratropium-albuterol  3 mL Nebulization Q4H   linagliptin  5 mg Oral Daily    metoprolol succinate  12.5 mg Oral BID   mirtazapine  15 mg Oral QHS   predniSONE  40 mg Oral Q breakfast   sertraline  50 mg  Oral Daily   sodium chloride flush  3 mL Intravenous Q12H   trandolapril  4 mg Oral Daily   Continuous Infusions:  sodium chloride       LOS: 0 days   [name]  [facility]   How to contact the [facility] Attending or Consulting provider [place] or covering provider during after hours [place], for this patient?  Check the care team in [facility] and look for a) attending/consulting [facility] provider listed and b) the [facility] team listed Log into [email or web address] and use [place]'s universal password to access. If you do not have the password, please contact the hospital operator. Locate the [facility] provider you are looking for under [facility] and page to a number that you can be directly reached. If you still have difficulty reaching the provider, please page the [facility] (Director on Call) for the Hospitalists listed on amion for assistance.  06/25/2021, 1:27 PM

## 2021-06-26 DIAGNOSIS — J9601 Acute respiratory failure with hypoxia: Secondary | ICD-10-CM | POA: Diagnosis not present

## 2021-06-26 LAB — BASIC METABOLIC PANEL
Anion gap: 6 (ref 5–15)
BUN: 25 mg/dL — ABNORMAL HIGH (ref 8–23)
CO2: 26 mmol/L (ref 22–32)
Calcium: 8.6 mg/dL — ABNORMAL LOW (ref 8.9–10.3)
Chloride: 104 mmol/L (ref 98–111)
Creatinine, Ser: 1.38 mg/dL — ABNORMAL HIGH (ref 0.44–1.00)
GFR, Estimated: 36 mL/min — ABNORMAL LOW (ref >60)
Glucose, Bld: 215 mg/dL — ABNORMAL HIGH (ref 70–99)
Potassium: 3.8 mmol/L (ref 3.5–5.1)
Sodium: 136 mmol/L (ref 135–145)

## 2021-06-26 LAB — GLUCOSE, CAPILLARY
Glucose-Capillary: 154 mg/dL — ABNORMAL HIGH (ref 70–99)
Glucose-Capillary: 294 mg/dL — ABNORMAL HIGH (ref 70–99)

## 2021-06-26 MED ORDER — FUROSEMIDE 10 MG/ML IJ SOLN
20.0000 mg | Freq: Once | INTRAMUSCULAR | Status: AC
  Administered 2021-06-26: 20 mg via INTRAVENOUS
  Filled 2021-06-26: qty 2

## 2021-06-26 MED ORDER — PREDNISONE 20 MG PO TABS: 40.0000 mg | ORAL_TABLET | Freq: Every day | ORAL | 0 refills | Status: DC

## 2021-06-26 MED ORDER — GUAIFENESIN ER 600 MG PO TB12: 1200.0000 mg | ORAL_TABLET | Freq: Two times a day (BID) | ORAL | 0 refills | Status: DC

## 2021-06-26 MED ORDER — ALBUTEROL SULFATE (2.5 MG/3ML) 0.083% IN NEBU: 2.5000 mg | INHALATION_SOLUTION | RESPIRATORY_TRACT | 0 refills | Status: DC | PRN

## 2021-06-26 NOTE — Evaluation (Addendum)
Physical Therapy Evaluation Patient Details Name: Renee [name] MRN: [id number] DOB: 1929/01/02 Today's Date: 06/26/2021  History of Present Illness  Pt is a 87 y/o female admitted 11/4 secondary to cough and fever. Thought to have acute respiratory failure with hypoxia from rhinovirus. PMH includes DM, HTN, CVA with L deficits, and CKD.   Clinical Impression  Pt admitted secondary to problem above with deficits below. Pt requiring min A for bed mobility and mod A to stand and transfer to chair. Pt reports she has caregivers that provide assist for most of the day. Spoke with sister in law and discussed need for increased supervision at evening and night. Of note, pt worked with OT later and was able to ambulate with supervision during second time up. Recommending HHPT and increased supervision in evening hours at d/c. Will continue to follow acutely.      Recommendations for follow up therapy are one component of a multi-disciplinary discharge planning process, led by the attending physician.  Recommendations may be updated based on patient status, additional functional criteria and insurance authorization.  Follow Up Recommendations Home health PT (if family/caregiver can provide necessary assist)    Assistance Recommended at Discharge Frequent or constant Supervision/Assistance  Functional Status Assessment Patient has had a recent decline in their functional status and demonstrates the ability to make significant improvements in function in a reasonable and predictable amount of time.  Equipment Recommendations  None recommended by PT    Recommendations for Other Services OT consult     Precautions / Restrictions Precautions Precautions: Fall Restrictions Weight Bearing Restrictions: No      Mobility  Bed Mobility Overal bed mobility: Needs Assistance Bed Mobility: Supine to Sit     Supine to sit: Min assist;HOB elevated     General bed mobility comments: HEavy use of bed  rails. Min A for trunk and LE assist.    Transfers Overall transfer level: Needs assistance Equipment used: Rolling walker (2 wheels) Transfers: Sit to/from Stand;Bed to chair/wheelchair/BSC Sit to Stand: Mod assist Stand pivot transfers: Mod assist         General transfer comment: Mod A for steadying to stand and transfer to chair. Pt attempting to sit prematurely and required cues to fully turn.    Ambulation/Gait                  Stairs            Wheelchair Mobility    Modified Rankin (Stroke Patients Only)       Balance Overall balance assessment: Needs assistance Sitting-balance support: No upper extremity supported;Feet supported Sitting balance-Leahy Scale: Fair     Standing balance support: Bilateral upper extremity supported Standing balance-Leahy Scale: Poor Standing balance comment: Reliant on UE and external support                             Pertinent Vitals/Pain Pain Assessment: No/denies pain    Home Living Family/patient expects to be discharged to:: Private residence Living Arrangements: Alone Available Help at Discharge: Personal care attendant (Comes M-F from 9-5; sat-sun 9-1) Type of Home: House Home Access: Ramped entrance       Home Layout: One level Home Equipment: Advice worker (2 wheels);Transport chair;BSC/3in1      Prior Function Prior Level of Function : Needs assist       Physical Assist : Mobility (physical);ADLs (physical) Mobility (physical): Gait ADLs (physical): Bathing;Dressing;IADLs Mobility Comments: Use  RW for ambulation with supervision for safety. ADLs Comments: Aide assists with bathing/dressing; cooking meals     Hand Dominance        Extremity/Trunk Assessment   Upper Extremity Assessment Upper Extremity Assessment: Defer to OT evaluation    Lower Extremity Assessment Lower Extremity Assessment: LLE deficits/detail LLE Deficits / Details: LLE weakness at  baseline    Cervical / Trunk Assessment Cervical / Trunk Assessment: Kyphotic  Communication   Communication: No difficulties  Cognition Arousal/Alertness: Awake/alert Behavior During Therapy: WFL for tasks assessed/performed Overall Cognitive Status: No family/caregiver present to determine baseline cognitive functioning                                 General Comments: Pt reports feeling more confused than normal        General Comments General comments (skin integrity, edema, etc.): Discussed need for more support with sister in law    Exercises     Assessment/Plan    PT Assessment Patient needs continued PT services  PT Problem List Decreased strength;Decreased activity tolerance;Decreased balance;Decreased mobility;Decreased knowledge of use of DME;Cardiopulmonary status limiting activity;Pain       PT Treatment Interventions DME instruction;Gait training;Functional mobility training;Therapeutic activities;Balance training;Therapeutic exercise;Patient/family education    PT Goals (Current goals can be found in the Care Plan section)  Acute Rehab PT Goals Patient Stated Goal: to go home PT Goal Formulation: With patient Time For Goal Achievement: 07/10/21 Potential to Achieve Goals: Good    Frequency Min 3X/week   Barriers to discharge        Co-evaluation               AM-PAC PT "6 Clicks" Mobility  Outcome Measure Help needed turning from your back to your side while in a flat bed without using bedrails?: A Little Help needed moving from lying on your back to sitting on the side of a flat bed without using bedrails?: A Little Help needed moving to and from a bed to a chair (including a wheelchair)?: A Lot Help needed standing up from a chair using your arms (e.g., wheelchair or bedside chair)?: A Lot Help needed to walk in hospital room?: A Lot Help needed climbing 3-5 steps with a railing? : A Lot 6 Click Score: 14    End of Session    Activity Tolerance: Patient tolerated treatment well Patient left: in chair;with call bell/phone within reach;with chair alarm set Nurse Communication: Mobility status PT Visit Diagnosis: Unsteadiness on feet (R26.81);Muscle weakness (generalized) (M62.81);Difficulty in walking, not elsewhere classified (R26.2)    Time: 1224-8250 PT Time Calculation (min) (ACUTE ONLY): 17 min   Charges:   PT Evaluation $PT Eval Moderate Complexity: 1 Mod          Reuel Derby, PT, DPT  Acute Rehabilitation Services  Pager: [phone] Office: [phone]   Rudean Hitt 06/26/2021, 12:10 PM

## 2021-06-26 NOTE — Evaluation (Signed)
Occupational Therapy Evaluation Patient Details Name: Renee [name] MRN: [id number] DOB: 1929-02-19 Today's Date: 06/26/2021   History of Present Illness Pt is a 87 y/o female admitted 11/4 secondary to cough and fever. Thought to have acute respiratory failure with hypoxia from rhinovirus. medical history significant of anxiety, depression, CKD 4, diabetes, hypertension, fibrotic wrist disease, weakness, history of CVA with residual left hemiparesis, hemorrhoids, hyperlipidemia. (   Clinical Impression   PTA pt lives at home with caregiver assist during the days as noted below. Family friend, who also assists with care giving present during assessment. At baseline, pt is able to mobilize @ modified independent level and family/caregiver assist with ADL/IADL tasks. Pt uses a lift chair at baseline, which helps tremendously with mobility. Pt able to safely ambulate to the bathroom with minguard A @ RW level with VSS on RA. Daughter lives beside the pt and checks on her after her caregiver leaves for the day @ 5pm. Pt also has a fall alert system in place. Discussed recommendations regarding need for increased assistance after 5pm to reduce risk of falls with pt/family friend, who is "like a grand daughter" to the pt. She states they are working on getting more assistance. Recommend HHOT. Will follow acutely.     Recommendations for follow up therapy are one component of a multi-disciplinary discharge planning process, led by the attending physician.  Recommendations may be updated based on patient status, additional functional criteria and insurance authorization.   Follow Up Recommendations  Home health OT    Assistance Recommended at Discharge Frequent or constant Supervision/Assistance  Functional Status Assessment  Patient has had a recent decline in their functional status and demonstrates the ability to make significant improvements in function in a reasonable and predictable amount of time.   Equipment Recommendations  None recommended by OT    Recommendations for Other Services       Precautions / Restrictions Precautions Precautions: Fall Restrictions Weight Bearing Restrictions: No      Mobility Bed Mobility Overal bed mobility: Needs Assistance Bed Mobility: Supine to Sit     Supine to sit: Min assist;HOB elevated     General bed mobility comments: Pt does not use bed at home    Transfers Overall transfer level: Needs assistance Equipment used: Rolling walker (2 wheels) Transfers: Sit to/from Stand Sit to Stand: Min guard          General transfer comment: Increased time but minguard      Balance Overall balance assessment: Needs assistance Sitting-balance support: No upper extremity supported;Feet supported Sitting balance-Leahy Scale: Fair     Standing balance support: Bilateral upper extremity supported Standing balance-Leahy Scale: Poor Standing balance comment: Reliant on UE and external support                           ADL either performed or assessed with clinical judgement   ADL Overall ADL's : Needs assistance/impaired     Grooming: Set up;Supervision/safety;Standing   Upper Body Bathing: Set up;Supervision/ safety;Sitting   Lower Body Bathing: Moderate assistance;Sit to/from stand   Upper Body Dressing : Minimal assistance;Sitting   Lower Body Dressing: Moderate assistance;Sit to/from stand   Toilet Transfer: Min guard;Ambulation;Rolling walker (2 wheels)   Toileting- Clothing Manipulation and Hygiene: Min guard;Sit to/from stand       Functional mobility during ADLs: Min guard;Rolling walker (2 wheels);Cueing for safety       Vision Baseline Vision/History: 1 Wears glasses  Perception     Praxis      Pertinent Vitals/Pain Pain Assessment: No/denies pain     Hand Dominance Right   Extremity/Trunk Assessment Upper Extremity Assessment Upper Extremity Assessment: LUE  deficits/detail;Generalized weakness;RUE deficits/detail RUE Deficits / Details: generalized weakness LUE Deficits / Details: hemiparesis from prior CVA - using as functional assist   Lower Extremity Assessment Lower Extremity Assessment: Defer to PT evaluation LLE Deficits / Details: LLE weakness at baseline   Cervical / Trunk Assessment Cervical / Trunk Assessment: Kyphotic   Communication Communication Communication: HOH   Cognition Arousal/Alertness: Awake/alert Behavior During Therapy: WFL for tasks assessed/performed Overall Cognitive Status: Within Functional Limits for tasks assessed                                 General Comments: Family confirms she is at her baseline cognitively     General Comments  VSS on RA    Exercises Exercises: Other exercises Other Exercises Other Exercises: Flutter valve x 10 - productive cough   Shoulder Instructions      Home Living Family/patient expects to be discharged to:: Private residence Living Arrangements: Alone Available Help at Discharge: Personal care attendant (Comes M-F from 9-5; sat-sun 9-1) Type of Home: House Home Access: Ramped entrance     Home Layout: One level     Bathroom Shower/Tub: Occupational psychologist: Handicapped height Bathroom Accessibility: Yes How Accessible: Accessible via walker Home Equipment: Advice worker (2 wheels);Transport chair;BSC/3in1;Grab bars - tub/shower (lift chair)          Prior Functioning/Environment Prior Level of Function : Needs assist       Physical Assist : Mobility (physical);ADLs (physical) Mobility (physical): Gait ADLs (physical): Bathing;Dressing;IADLs Mobility Comments: Use RW for ambulation with supervision for safety. ADLs Comments: Aide assists with bathing/dressing; cooking meals        OT Problem List: Decreased strength;Decreased activity tolerance;Impaired balance (sitting and/or standing);Decreased safety  awareness;Decreased knowledge of use of DME or AE;Cardiopulmonary status limiting activity;Obesity      OT Treatment/Interventions: Self-care/ADL training;Therapeutic exercise;Energy conservation;DME and/or AE instruction;Therapeutic activities;Cognitive remediation/compensation;Patient/family education;Balance training    OT Goals(Current goals can be found in the care plan section) Acute Rehab OT Goals Patient Stated Goal: to get better adn go home OT Goal Formulation: With patient Time For Goal Achievement: 07/10/21 Potential to Achieve Goals: Good  OT Frequency: Min 2X/week   Barriers to D/C:            Co-evaluation              AM-PAC OT "6 Clicks" Daily Activity     Outcome Measure Help from another person eating meals?: None Help from another person taking care of personal grooming?: A Little Help from another person toileting, which includes using toliet, bedpan, or urinal?: A Little Help from another person bathing (including washing, rinsing, drying)?: A Lot Help from another person to put on and taking off regular upper body clothing?: A Little Help from another person to put on and taking off regular lower body clothing?: A Lot 6 Click Score: 17   End of Session Equipment Utilized During Treatment: Gait belt;Rolling walker (2 wheels) Nurse Communication: Mobility status  Activity Tolerance: Patient tolerated treatment well Patient left: in chair;with call bell/phone within reach;with chair alarm set  OT Visit Diagnosis: Unsteadiness on feet (R26.81);Muscle weakness (generalized) (M62.81)  Time: 1130-1153 OT Time Calculation (min): 23 min Charges:  OT General Charges $OT Visit: 1 Visit OT Evaluation $OT Eval Moderate Complexity: 1 Mod OT Treatments $Self Care/Home Management : 8-22 mins  Maurie Boettcher, OT/L   Acute OT Clinical Specialist Acute Rehabilitation Services Pager [phone] Office [phone]   [facility] 06/26/2021,  12:18 PM

## 2021-06-26 NOTE — TOC Transition Note (Addendum)
Transition of Care [facility]) - CM/SW Discharge Note   Patient Details  Name: Renee [name] MRN: [id number] Date of Birth: [birth year]  [facility]) CM/SW Contact:  Zenon Mayo, RN Phone Number: 06/26/2021, 12:47 PM   Clinical Narrative:    Patient is for dc today, NCM offered choice, she states [facility] , but they said to try [place] and she states that is fine , NCM made referral to [facility] with [place] for [place]. She can take referral.  Actually she is active with [facility] and she is ok to stay with [facility] per patient.   Her caregiver will transport her home at dc per the grand daughter. Per [employer] they do not have any neb machines here on site they called patient in the room and explained to her that they could deliver it to her house or she could pick it up from the store, she chose to have it delivered to her home in a couple of days. NCM asked patient if she wants to go with another company she states she can.  NCM made referral to [facility] with [employer], he will deliver the nebulizer machine to her home today.    Final next level of care: [place] Barriers to Discharge: No Barriers Identified   Patient Goals and CMS Choice Patient states their goals for this hospitalization and ongoing recovery are:: return home with [facility] CMS Medicare.gov Compare Post Acute Care list provided to:: Patient Choice offered to / list presented to : Patient  Discharge Placement                       Discharge Plan and Services                DME Arranged: Nebulizer machine DME Agency [employer] Date DME Agency Contacted: 06/26/21 Time DME Agency Contacted: [id number] Representative spoke with at DME angency [place] HH Arranged: PT, OT [place]  Date [place]: 06/26/21 Time [place]: [id number] Representative spoke with at [place] ([place]) Interventions     Readmission Risk  Interventions Readmission Risk Prevention Plan 06/10/2020  PCP or Specialist appointment within 3-5 days of discharge Complete  Some recent data might be hidden

## 2021-06-26 NOTE — TOC Progression Note (Signed)
Transition of Care [facility]) - Progression Note    Patient Details  Name: TIMI REESER MRN: [id number] Date of Birth: 1929-07-14  Transition of [facility] CM/SW Contact  Zenon Mayo, RN Phone Number: 06/26/2021, 10:27 AM  Clinical Narrative:    NCM spoke with patient about the neb machine, she is ok with [employer] supplying this for her they will bring it up to her room prior to dc. Today.        Expected Discharge Plan and Services                                                 Social Determinants of Health (SDOH) Interventions    Readmission Risk Interventions Readmission Risk Prevention Plan 06/10/2020  PCP or Specialist appointment within 3-5 days of discharge Complete  Some recent data might be hidden

## 2021-06-26 NOTE — Discharge Summary (Signed)
Physician Discharge Summary  Renee [name] MWN:[id number] DOB: 1929-03-21 DOA: 06/23/2021  PCP: [name], MD  Admit date: 06/23/2021 Discharge date: 06/26/2021  Admitted From: home Discharge disposition: home   Recommendations for Outpatient Follow-Up:   Continue pulm toilet Blood sugars to be higher while on steroids Nebs prn   Discharge Diagnosis:   Principal Problem:   Acute respiratory failure with hypoxia ([place]) Active Problems:   Type 2 diabetes, controlled, with retinopathy ([place])   Essential hypertension   Anxiety and depression   Sleep apnea   History of CVA (cerebrovascular accident)   CKD (chronic kidney disease) stage 4, GFR 15-29 ml/min ([place])    Discharge Condition: Improved.  Diet recommendation: Low sodium, heart healthy.  Carbohydrate-modified..  Wound care: None.  Code status:DNR   History of Present Illness:   Renee [name] is a 87 y.o. female with medical history significant of anxiety, depression, CKD 4, diabetes, hypertension, fibrotic wrist disease, weakness, history of CVA with residual left hemiparesis, hemorrhoids, hyperlipidemia, pleural effusion presenting with 1 week of a myriad of symptoms including cough shortness of breath and wheezing.  As above patient states she began having symptoms around 1 week ago.  Consisting of cough, congestion, shortness of breath, wheezing.  She did go to the emergency department on October 31 however she left without being seen.  Her labs at that time were benign did have a mild BNP elevation to 200s.  She went to see her PCP earlier today and based on her story and physical exam with wheezing and rhonchi they were concerned for possible pneumonia, also concerning to them was her 90% saturation on room air in their office and her reported fever at home.  She was sent to the ED for further evaluation.   She does report fever last Saturday morning around 100.6.  No fever thus far here.  She also  has had a productive cough with thick yellow sputum.  She does take an albuterol inhaler but is never been formally diagnosed with COPD or asthma.  Unclear if this was prescribed for the period of time when she had her pleural effusion.  She does report similar episodes of shortness of breath in the past that she has taken albuterol for.   She denies chills, chest pain, abdominal pain, constipation, diarrhea, nausea, vomiting.    Hospital Course by Problem:   Acute respiratory failure with hypoxia due to rhinovirus > Patient saturating around 90% at her PCP office in the low 90s here at rest on room air.  Did desaturate into the mid 80s on ambulation or other exertion. > No history in the chart of asthma or COPD as prescribed a rescue inhaler as needed.  Was seen for pleural effusion in the past. > Chest x-ray in the ED here read as no acute disease. -procalcitonin negative > No leukocytosis in the ED nor fever thus far. - Oxygen therapy, wean as tolerated- home O2 study is ok no O2 need - Continue prednisone burst - Continue  as needed albuterol  -needs aggressive pulm toilet-   OSA - No longer on CPAP   CKD 4 > Creatinine stable at 1.49 - Avoid nephrotoxic agents - Trend renal function and electrolytes  Diabetes > On multiple oral medications outpatient- while on steroids will be harder to control   Hypertension - Continue home amlodipine, hydralazine, metoprolol, trandolapril    Anxiety Depression - Continue home sertraline, Xanax, Remeron  History of CVA >  Has a degree of residual left hemiparesis - Continue home aspirin    obesity Body mass index is 31.48 kg/m.    Medical Consultants:      Discharge Exam:   Vitals:   06/26/21 0836 06/26/21 0856  BP:  (!) 154/61  Pulse:  66  Resp:    Temp:    SpO2: 96%    Vitals:   06/25/21 2032 06/26/21 0530 06/26/21 0836 06/26/21 0856  BP: (!) 160/87 (!) 152/57  (!) 154/61  Pulse: 70 63  66  Resp: 20 19    Temp:  98.2 F (36.8 C) 97.8 F (36.6 C)    TempSrc: Oral Oral    SpO2: 95% 98% 96%   Weight:  81.2 kg    Height:        General exam: Appears calm and comfortable.    The results of significant diagnostics from this hospitalization (including imaging, microbiology, ancillary and laboratory) are listed below for reference.     Procedures and Diagnostic Studies:   DG Chest 2 View  Result Date: 06/23/2021 CLINICAL DATA:  One week history of cough, congestion, shortness of breath and wheezing. EXAM: CHEST - 2 VIEW COMPARISON:  06/19/2021. FINDINGS: Cardiac silhouette is mildly enlarged. No mediastinal or hilar masses or evidence of adenopathy. Clear lungs.  No pleural effusion or pneumothorax. Skeletal structures are intact. IMPRESSION: No acute cardiopulmonary disease. Electronically Signed   By: Lajean Manes M.D.   On: 06/23/2021 14:42     Labs:   Basic Metabolic Panel: Recent Labs  Lab 06/19/21 1739 06/23/21 1354 06/24/21 0205 06/24/21 0510 06/26/21 0057  NA 141 141  --  138 136  K 4.0 4.1  --  4.0 3.8  CL 107 109  --  107 104  CO2 29 27  --  25 26  GLUCOSE 197* 280*  --  320* 215*  BUN 27* 26*  --  26* 25*  CREATININE 1.30* 1.49*  --  1.35* 1.38*  CALCIUM 8.9 8.3*  --  8.3* 8.6*  MG  --   --  1.8  --   --    GFR Estimated Creatinine Clearance: 27.3 mL/min (A) (by C-G formula based on SCr of 1.38 mg/dL (H)). Liver Function Tests: Recent Labs  Lab 06/19/21 1739 06/23/21 1354 06/24/21 0510  AST 17 18 13*  ALT _0 ALKPHOS 73 61 56  BILITOT 0.8 0.3 0.3  PROT 6.3* 5.4* 5.0*  ALBUMIN 3.9 3.2* 2.9*   No results for input(s): LIPASE, AMYLASE in the last 168 hours. No results for input(s): AMMONIA in the last 168 hours. Coagulation profile No results for input(s): INR, PROTIME in the last 168 hours.  CBC: Recent Labs  Lab 06/19/21 1739 06/23/21 1604 06/24/21 0510  WBC 7.3 4.7 4.5  NEUTROABS 5.4 3.0  --   HGB 11.8* 11.6* 9.9*  HCT 37.8 37.3 30.8*  MCV  90.9 89.9 88.3  PLT 152 173 160   Cardiac Enzymes: No results for input(s): CKTOTAL, CKMB, CKMBINDEX, TROPONINI in the last 168 hours. BNP: Invalid input(s): POCBNP CBG: Recent Labs  Lab 06/25/21 1147 06/25/21 1654 06/25/21 2138 06/26/21 0632 06/26/21 1103  GLUCAP 202* 242* 255* 154* 294*   D-Dimer No results for input(s): DDIMER in the last 72 hours. Hgb A1c No results for input(s): HGBA1C in the last 72 hours. Lipid Profile No results for input(s): CHOL, HDL, LDLCALC, TRIG, CHOLHDL, LDLDIRECT in the last 72 hours. Thyroid function studies No results for input(s): TSH, T4TOTAL,  T3FREE, THYROIDAB in the last 72 hours.  Invalid input(s): FREET3 Anemia work up No results for input(s): VITAMINB12, FOLATE, FERRITIN, TIBC, IRON, RETICCTPCT in the last 72 hours. Microbiology Recent Results (from the past 240 hour(s))  Resp Panel by RT-PCR (Flu A&B, Covid) Nasopharyngeal Swab     Status: None   Collection Time: 06/19/21  9:00 PM   Specimen: Nasopharyngeal Swab; Nasopharyngeal(NP) swabs in vial transport medium  Result Value Ref Range Status   SARS Coronavirus 2 by RT PCR NEGATIVE NEGATIVE Final    Comment: (NOTE) SARS-CoV-2 target nucleic acids are NOT DETECTED.  The SARS-CoV-2 RNA is generally detectable in upper respiratory specimens during the acute phase of infection. The lowest concentration of SARS-CoV-2 viral copies this assay can detect is 138 copies/mL. A negative result does not preclude SARS-Cov-2 infection and should not be used as the sole basis for treatment or other patient management decisions. A negative result may occur with  improper specimen collection/handling, submission of specimen other than nasopharyngeal swab, presence of viral mutation(s) within the areas targeted by this assay, and inadequate number of viral copies(<138 copies/mL). A negative result must be combined with clinical observations, patient history, and epidemiological information. The  expected result is Negative.  Fact Sheet for Patients:  EntrepreneurPulse.com.au  Fact Sheet for Healthcare Providers:  IncredibleEmployment.be  This test is no t yet approved or cleared by the [place] FDA and  has been authorized for detection and/or diagnosis of SARS-CoV-2 by FDA under an Emergency Use Authorization (EUA). This EUA will remain  in effect (meaning this test can be used) for the duration of the COVID-19 declaration under Section 564(b)(1) of the Act, 21 U.S.C.section 360bbb-3(b)(1), unless the authorization is terminated  or revoked sooner.       Influenza A by PCR NEGATIVE NEGATIVE Final   Influenza B by PCR NEGATIVE NEGATIVE Final    Comment: (NOTE) The Xpert Xpress SARS-CoV-2/FLU/RSV plus assay is intended as an aid in the diagnosis of influenza from Nasopharyngeal swab specimens and should not be used as a sole basis for treatment. Nasal washings and aspirates are unacceptable for Xpert Xpress SARS-CoV-2/FLU/RSV testing.  Fact Sheet for Patients: EntrepreneurPulse.com.au  Fact Sheet for Healthcare Providers: IncredibleEmployment.be  This test is not yet approved or cleared by the [place] FDA and has been authorized for detection and/or diagnosis of SARS-CoV-2 by FDA under an Emergency Use Authorization (EUA). This EUA will remain in effect (meaning this test can be used) for the duration of the COVID-19 declaration under Section 564(b)(1) of the Act, 21 U.S.C. section 360bbb-3(b)(1), unless the authorization is terminated or revoked.  Performed at [facility], [address] [place], [place] [address]   Resp Panel by RT-PCR (Flu A&B, Covid) Nasopharyngeal Swab     Status: None   Collection Time: 06/23/21  1:56 PM   Specimen: Nasopharyngeal Swab; Nasopharyngeal(NP) swabs in vial transport medium  Result Value Ref Range Status   SARS Coronavirus 2 by RT PCR  NEGATIVE NEGATIVE Final    Comment: (NOTE) SARS-CoV-2 target nucleic acids are NOT DETECTED.  The SARS-CoV-2 RNA is generally detectable in upper respiratory specimens during the acute phase of infection. The lowest concentration of SARS-CoV-2 viral copies this assay can detect is 138 copies/mL. A negative result does not preclude SARS-Cov-2 infection and should not be used as the sole basis for treatment or other patient management decisions. A negative result may occur with  improper specimen collection/handling, submission of specimen other than nasopharyngeal swab, presence  of viral mutation(s) within the areas targeted by this assay, and inadequate number of viral copies(<138 copies/mL). A negative result must be combined with clinical observations, patient history, and epidemiological information. The expected result is Negative.  Fact Sheet for Patients:  EntrepreneurPulse.com.au  Fact Sheet for Healthcare Providers:  IncredibleEmployment.be  This test is no t yet approved or cleared by the [place] FDA and  has been authorized for detection and/or diagnosis of SARS-CoV-2 by FDA under an Emergency Use Authorization (EUA). This EUA will remain  in effect (meaning this test can be used) for the duration of the COVID-19 declaration under Section 564(b)(1) of the Act, 21 U.S.C.section 360bbb-3(b)(1), unless the authorization is terminated  or revoked sooner.       Influenza A by PCR NEGATIVE NEGATIVE Final   Influenza B by PCR NEGATIVE NEGATIVE Final    Comment: (NOTE) The Xpert Xpress SARS-CoV-2/FLU/RSV plus assay is intended as an aid in the diagnosis of influenza from Nasopharyngeal swab specimens and should not be used as a sole basis for treatment. Nasal washings and aspirates are unacceptable for Xpert Xpress SARS-CoV-2/FLU/RSV testing.  Fact Sheet for Patients: EntrepreneurPulse.com.au  Fact Sheet for  Healthcare Providers: IncredibleEmployment.be  This test is not yet approved or cleared by the [place] FDA and has been authorized for detection and/or diagnosis of SARS-CoV-2 by FDA under an Emergency Use Authorization (EUA). This EUA will remain in effect (meaning this test can be used) for the duration of the COVID-19 declaration under Section 564(b)(1) of the Act, 21 U.S.C. section 360bbb-3(b)(1), unless the authorization is terminated or revoked.  Performed at [facility], [place] [address] [place], [place] [address]   Respiratory (~20 pathogens) panel by PCR     Status: Abnormal   Collection Time: 06/23/21  9:42 PM   Specimen: Nasopharyngeal Swab; Respiratory  Result Value Ref Range Status   Adenovirus NOT DETECTED NOT DETECTED Final   Coronavirus 229E NOT DETECTED NOT DETECTED Final    Comment: (NOTE) The Coronavirus on the Respiratory Panel, DOES NOT test for the novel  Coronavirus (2019 nCoV)    Coronavirus HKU1 NOT DETECTED NOT DETECTED Final   Coronavirus NL63 NOT DETECTED NOT DETECTED Final   Coronavirus OC43 NOT DETECTED NOT DETECTED Final   Metapneumovirus NOT DETECTED NOT DETECTED Final   Rhinovirus / Enterovirus DETECTED (A) NOT DETECTED Final   Influenza A NOT DETECTED NOT DETECTED Final   Influenza B NOT DETECTED NOT DETECTED Final   Parainfluenza Virus 1 NOT DETECTED NOT DETECTED Final   Parainfluenza Virus 2 NOT DETECTED NOT DETECTED Final   Parainfluenza Virus 3 NOT DETECTED NOT DETECTED Final   Parainfluenza Virus 4 NOT DETECTED NOT DETECTED Final   Respiratory Syncytial Virus NOT DETECTED NOT DETECTED Final   Bordetella pertussis NOT DETECTED NOT DETECTED Final   Bordetella Parapertussis NOT DETECTED NOT DETECTED Final   Chlamydophila pneumoniae NOT DETECTED NOT DETECTED Final   Mycoplasma pneumoniae NOT DETECTED NOT DETECTED Final    Comment: Performed at [facility], [place]. [address] [place], [place] [address]      Discharge Instructions:   Discharge Instructions     Diet - low sodium heart healthy   Complete by: As directed    Diet Carb Modified   Complete by: As directed    Discharge instructions   Complete by: As directed    Continue pulmonary toilet   Increase activity slowly   Complete by: As directed    No wound care  Complete by: As directed       Allergies as of 06/26/2021       Reactions   Tape Other (See Comments)   The patient's skin is VERY THIN- will TEAR EASILY!!   Diclofenac Sodium Swelling, Other (See Comments)   Angioedema   Pioglitazone Swelling, Other (See Comments)   Re-started in 2022        Medication List     STOP taking these medications    furosemide 20 MG tablet Commonly known as: LASIX       TAKE these medications    acetaminophen 500 MG tablet Commonly known as: TYLENOL Take 1,000 mg by mouth at bedtime.   albuterol 108 (90 Base) MCG/ACT inhaler Commonly known as: VENTOLIN HFA INHALE 2 PUFFS INTO THE LUNGS EVERY 4 HOURS AS NEEDED FOR WHEEZING/SHORTNESS OF BREATH What changed: See the new instructions.   albuterol (2.5 MG/3ML) 0.083% nebulizer solution Commonly known as: PROVENTIL Take 3 mLs (2.5 mg total) by nebulization every 2 (two) hours as needed for shortness of breath. What changed: You were already taking a medication with the same name, and this prescription was added. Make sure you understand how and when to take each.   ALPRAZolam 0.5 MG tablet Commonly known as: XANAX TAKE 1/2 TO 1 TABLET BY MOUTH TWICE A DAY AS NEEDED FOR ANXIETY What changed: See the new instructions.   amLODipine 10 MG tablet Commonly known as: NORVASC Take 1 tablet (10 mg total) by mouth daily.   aspirin EC 325 MG tablet Take 325 mg by mouth in the morning.   calcium-vitamin D 500-200 MG-UNIT tablet Commonly known as: OSCAL WITH D Take 1 tablet by mouth daily.   Cranberry 500 MG Tabs Take 500 mg by mouth in the morning and at bedtime.    docusate sodium 100 MG capsule Commonly known as: COLACE Take 200 mg by mouth at bedtime.   Flonase 50 MCG/ACT nasal spray Generic drug: fluticasone Place 2 sprays into both nostrils See admin instructions. Instill 2 sprays into each nostril at bedtime when not using Nasacort   glipiZIDE 10 MG 24 hr tablet Commonly known as: glipiZIDE XL Take 1 tablet (10 mg total) by mouth daily with breakfast.   guaiFENesin 600 MG 12 hr tablet Commonly known as: MUCINEX Take 2 tablets (1,200 mg total) by mouth 2 (two) times daily.   hydrALAZINE 100 MG tablet Commonly known as: APRESOLINE Take 1 tablet (100 mg total) by mouth 3 (three) times daily.   metoprolol succinate 25 MG 24 hr tablet Commonly known as: TOPROL-XL Take 0.5 tablets (12.5 mg total) by mouth 2 (two) times daily. What changed: when to take this   mirtazapine 15 MG tablet Commonly known as: REMERON Take 1 tablet (15 mg total) by mouth at bedtime.   multivitamin capsule Take 1 capsule by mouth daily.   ONE TOUCH ULTRA 2 w/Device Kit Check blood sugar once daily and as directed. Dx L93.5   OneTouch Delica Lancets 70V Misc CHECK BLOOD SUGAR ONCE DAILY AND AS DIRECTED   OneTouch Ultra test strip Generic drug: glucose blood CHECK BLOOD SUGAR ONCE DAILY AND AS DIRECTED. DX E11.9   PATADAY OP Place 1 drop into both eyes 2 (two) times daily as needed (for itching or redness).   pioglitazone 15 MG tablet Commonly known as: Actos Take 1 tablet (15 mg total) by mouth daily.   predniSONE 20 MG tablet Commonly known as: DELTASONE Take 2 tablets (40 mg total) by mouth daily with  breakfast. Start taking on: June 27, 2021   PreserVision AREDS 2+Multi Vit Caps Take 1 capsule by mouth in the morning and at bedtime.   sertraline 50 MG tablet Commonly known as: ZOLOFT TAKE 1 TABLET BY MOUTH EVERY DAY What changed: when to take this   sitaGLIPtin 100 MG tablet Commonly known as: Januvia Take 1 tablet (100 mg total) by  mouth daily.   trandolapril 4 MG tablet Commonly known as: MAVIK Take 1 tablet (4 mg total) by mouth daily.   triamcinolone 55 MCG/ACT Aero nasal inhaler Commonly known as: NASACORT Place 2 sprays into the nose daily. What changed:  when to take this additional instructions   vitamin B-12 1000 MCG tablet Commonly known as: CYANOCOBALAMIN Take 1,000 mcg by mouth daily.   Vitamin D-3 25 MCG (1000 UT) Caps Take 1,000 Units by mouth in the morning.               Durable Medical Equipment  (From admission, onward)           Start     Ordered   06/26/21 0832  For home use only DME Nebulizer machine  Once       Question Answer Comment  Patient needs a nebulizer to treat with the following condition Wheezing   Length of Need 6 Months      06/26/21 [facility] Follow up.   Why: neb machine Contact information: [place] [address] [phone]                  Time coordinating discharge: 25 min  Signed:  [name] DO  [facility] 06/26/2021, 12:24 PM

## 2021-06-27 ENCOUNTER — Other Ambulatory Visit: Payer: Self-pay

## 2021-06-27 ENCOUNTER — Telehealth: Payer: Self-pay

## 2021-06-27 DIAGNOSIS — E1169 Type 2 diabetes mellitus with other specified complication: Secondary | ICD-10-CM

## 2021-06-27 DIAGNOSIS — J9601 Acute respiratory failure with hypoxia: Secondary | ICD-10-CM

## 2021-06-27 DIAGNOSIS — R062 Wheezing: Secondary | ICD-10-CM | POA: Diagnosis not present

## 2021-06-27 DIAGNOSIS — E785 Hyperlipidemia, unspecified: Secondary | ICD-10-CM

## 2021-06-27 DIAGNOSIS — I69352 Hemiplegia and hemiparesis following cerebral infarction affecting left dominant side: Secondary | ICD-10-CM

## 2021-06-27 NOTE — Telephone Encounter (Signed)
Transition Care Management Follow-up Telephone Call Date of discharge and from where: [facility]/26/2021 [facility] Diagnosis: Acute Respiratory Failure with Hypoxia How have you been since you were released from the hospital? Weak. Wheezing a lot.  Any questions or concerns? No  Items Reviewed: Did the pt receive and understand the discharge instructions provided? Yes  Medications obtained and verified? Yes  Other? No  Any new allergies since your discharge? No  Dietary orders reviewed? Yes Do you have support at home? Yes   Home Care and Equipment/Supplies: Were home health services ordered? yes If so, what is the name of the agency? [facility]  Has the agency set up a time to come to the patient's home? no Were any new equipment or medical supplies ordered?  Yes: Nebulizer What is the name of the medical supply agency? [facility] Were you able to get the supplies/equipment? yes Do you have any questions related to the use of the equipment or supplies? Yes: Can order be placed for a mask for nebulizer machine?  Functional Questionnaire: (I = Independent and D = Dependent) ADLs: D  Bathing/Dressing- D  Meal Prep- D  Eating- I  Maintaining continence- I  Transferring/Ambulation- I  Managing Meds- D  Follow up appointments reviewed:  PCP Hospital f/u appt confirmed? Yes  Scheduled to see Dr. Dennard Schaumann on 06/30/21 @ 4. [facility] f/u appt confirmed?  N/A Are transportation arrangements needed? No  If their condition worsens, is the pt aware to call PCP or go to the Emergency Dept.? Yes Was the patient provided with contact information for the PCP's office or ED? Yes Was to pt encouraged to call back with questions or concerns? Yes

## 2021-06-27 NOTE — Telephone Encounter (Signed)
Called pt to do TCM call. Spoke with patient's caregiver, [place]. Earnest Bailey states pt is still having a lot of wheezing and her O2 level was 92 after her breathing treatment. Earnest Bailey asks if patient can get an order faxed to [facility] for a mask for her nebulizer, to do her treatments?  Current O2 1 hour after nebulizer, 91-92.

## 2021-06-27 NOTE — Telephone Encounter (Signed)
Transition Care Management Unsuccessful Follow-up Telephone Call  Date of discharge and from where:  06/26/2021, Renee [name]  Diagnosis: Acute Respiratory Failure with Hypoxia   Attempts:  1st Attempt  Reason for unsuccessful TCM follow-up call:  Left voice message

## 2021-06-27 NOTE — Telephone Encounter (Signed)
Faxed prescription and demographics to [facility]~ [phone]~ fax.

## 2021-06-29 ENCOUNTER — Telehealth: Payer: Self-pay | Admitting: Family Medicine

## 2021-06-29 NOTE — Telephone Encounter (Signed)
Call placed tp [place] with [facility],   Left message on VM that provider will approve [facility] PT/OT eval and call for plan of care.

## 2021-06-29 NOTE — Telephone Encounter (Signed)
Received call from [facility] with [place] to ask if Dr. Dennard Schaumann will sign patient's home health orders for PT and OT. Please advise at [phone].

## 2021-06-30 ENCOUNTER — Encounter: Payer: Self-pay | Admitting: Family Medicine

## 2021-06-30 ENCOUNTER — Ambulatory Visit (INDEPENDENT_AMBULATORY_CARE_PROVIDER_SITE_OTHER): Payer: Medicare HMO | Admitting: Family Medicine

## 2021-06-30 ENCOUNTER — Other Ambulatory Visit: Payer: Self-pay

## 2021-06-30 VITALS — BP 134/62 | HR 84 | Temp 97.9°F | Resp 22

## 2021-06-30 DIAGNOSIS — B341 Enterovirus infection, unspecified: Secondary | ICD-10-CM | POA: Diagnosis not present

## 2021-06-30 DIAGNOSIS — E785 Hyperlipidemia, unspecified: Secondary | ICD-10-CM | POA: Diagnosis not present

## 2021-06-30 DIAGNOSIS — E1122 Type 2 diabetes mellitus with diabetic chronic kidney disease: Secondary | ICD-10-CM | POA: Diagnosis not present

## 2021-06-30 DIAGNOSIS — R69 Illness, unspecified: Secondary | ICD-10-CM | POA: Diagnosis not present

## 2021-06-30 DIAGNOSIS — I69354 Hemiplegia and hemiparesis following cerebral infarction affecting left non-dominant side: Secondary | ICD-10-CM | POA: Diagnosis not present

## 2021-06-30 DIAGNOSIS — J069 Acute upper respiratory infection, unspecified: Secondary | ICD-10-CM | POA: Diagnosis not present

## 2021-06-30 DIAGNOSIS — J4531 Mild persistent asthma with (acute) exacerbation: Secondary | ICD-10-CM

## 2021-06-30 DIAGNOSIS — J9601 Acute respiratory failure with hypoxia: Secondary | ICD-10-CM | POA: Diagnosis not present

## 2021-06-30 DIAGNOSIS — N184 Chronic kidney disease, stage 4 (severe): Secondary | ICD-10-CM | POA: Diagnosis not present

## 2021-06-30 DIAGNOSIS — I10 Essential (primary) hypertension: Secondary | ICD-10-CM | POA: Diagnosis not present

## 2021-06-30 DIAGNOSIS — E11319 Type 2 diabetes mellitus with unspecified diabetic retinopathy without macular edema: Secondary | ICD-10-CM | POA: Diagnosis not present

## 2021-06-30 DIAGNOSIS — I129 Hypertensive chronic kidney disease with stage 1 through stage 4 chronic kidney disease, or unspecified chronic kidney disease: Secondary | ICD-10-CM | POA: Diagnosis not present

## 2021-06-30 NOTE — Progress Notes (Signed)
Subjective:    Patient ID: Renee [name], female    DOB: 1928/12/23, 87 y.o.   MRN: [id number] 06/23/21 Patient presents today with shortness of breath fever and cough.  She awoke Saturday morning with a fever to 100.6.  She had a cough and chest congestion.  By Monday she went to the emergency room.  In the emergency room, chest x-ray was reportedly normal.  Flu and COVID test were negative.  Troponin was mildly elevated at 20.  BNP was elevated over 200.  Patient left without being seen.  She presents today with worsening status.  Although she is not febrile here she continues to have fevers at home.  She continues to have cough productive of yellow mucus.  She reports chest congestion and shortness of breath.  Today she has increased work of breathing and an elevated respiratory rate.  On examination she has pronounced left basilar crackles and rhonchorous breath sounds all throughout the left lung.  Right lung is relatively clear.  Concerning for left-sided pneumonia.  Oxygen saturation is 90% on room air.  At that time, my plan was:  Patient has pronounced left-sided pneumonia based on physical exam.  She is borderline hypoxic.  She has an increased respiratory rate.  This coupled with her advanced age and medical comorbidities makes me concerned that this patient is not a candidate to stay at home and try oral antibiotics over the weekend.  She lives alone.    I believe she would benefit from hospitalization and IV antibiotics and supervision in case her situation deteriorates further.  Therefore I recommended that she go to the emergency room for further evaluation and treatment.  06/30/21 Hospital Course by Problem:    Acute respiratory failure with hypoxia due to rhinovirus > Patient saturating around 90% at her PCP office in the low 90s here at rest on room air.  Did desaturate into the mid 80s on ambulation or other exertion. > No history in the chart of asthma or COPD as prescribed a  rescue inhaler as needed.  Was seen for pleural effusion in the past. > Chest x-ray in the ED here read as no acute disease. -procalcitonin negative > No leukocytosis in the ED nor fever thus far. - Oxygen therapy, wean as tolerated- home O2 study is ok no O2 need - Continue prednisone burst - Continue  as needed albuterol  -needs aggressive pulm toilet-   OSA - No longer on CPAP   CKD 4 > Creatinine stable at 1.49 - Avoid nephrotoxic agents - Trend renal function and electrolytes  Diabetes > On multiple oral medications outpatient- while on steroids will be harder to control   Hypertension - Continue home amlodipine, hydralazine, metoprolol, trandolapril    Anxiety Depression - Continue home sertraline, Xanax, Remeron  History of CVA > Has a degree of residual left hemiparesis - Continue home aspirin      Here for follow up.  Patient is here today stating that her breathing is only minimally better.  She is using albuterol 1-2 times a day and see some benefit from that.  She took 2 days of prednisone after she left the hospital but she is no longer taking that.  She denies any additional fevers or chills.  Pulse oximetry is 93% on room air.  She continues to cough.  There is no swelling in her legs.  There are no crackles or rails on her exam but she does have  scattered expiratory wheezes Past Medical  History:  Diagnosis Date   Angioedema    possibly from voltaren   Bronchopneumonia 12/11/2016   Degenerative disc disease    Diabetes mellitus    type II   Hyperlipidemia    Hypertension    LVH (left ventricular hypertrophy)    and atrial enlargement by echo in past with nl EF   Nasal pruritis    Osteoarthritis    Osteopenia    Renal insufficiency    Sleep apnea    Stroke ([place]) 05/2010   Small vessel sobcortical (in [place]) with Dr Leonie Man, residual L hemiparesis   Vitamin B 12 deficiency 04/08   Past Surgical History:  Procedure Laterality Date   ABDOMINAL  HYSTERECTOMY     BSO-fibroids   APPENDECTOMY     BACK SURGERY     COLON SURGERY     due to punctured intestines   EYE SURGERY     cataract extraction   IR THORACENTESIS ASP PLEURAL SPACE W/IMG GUIDE  06/07/2020   KNEE SURGERY     arthroscope   PARS PLANA VITRECTOMY  07/31/2011   Procedure: PARS PLANA VITRECTOMY WITH 25 GAUGE;  Surgeon: [name], MD;  Location: [facility];  Service: Ophthalmology;  Laterality: Right;  REMOVAL OF SILICONE OIL AND LASER RIGHT EYE   RETINAL DETACHMENT SURGERY  02/18/11   times 2   SPINE SURGERY  08/09   spinal decompression surgery   Current Outpatient Medications on File Prior to Visit  Medication Sig Dispense Refill   acetaminophen (TYLENOL) 500 MG tablet Take 1,000 mg by mouth at bedtime.     albuterol (PROVENTIL) (2.5 MG/3ML) 0.083% nebulizer solution Take 3 mLs (2.5 mg total) by nebulization every 2 (two) hours as needed for shortness of breath. 75 mL 0   albuterol (VENTOLIN HFA) 108 (90 Base) MCG/ACT inhaler INHALE 2 PUFFS INTO THE LUNGS EVERY 4 HOURS AS NEEDED FOR WHEEZING/SHORTNESS OF BREATH (Patient taking differently: Inhale 2 puffs into the lungs every 4 (four) hours as needed for shortness of breath or wheezing.) 18 each 3   ALPRAZolam (XANAX) 0.5 MG tablet TAKE 1/2 TO 1 TABLET BY MOUTH TWICE A DAY AS NEEDED FOR ANXIETY (Patient taking differently: Take 0.25-0.5 mg by mouth 2 (two) times daily as needed for anxiety.) 60 tablet 2   amLODipine (NORVASC) 10 MG tablet Take 1 tablet (10 mg total) by mouth daily. 90 tablet 3   aspirin EC 325 MG tablet Take 325 mg by mouth in the morning.     Blood Glucose Monitoring Suppl (ONE TOUCH ULTRA 2) w/Device KIT Check blood sugar once daily and as directed. Dx E11.9 1 each 0   calcium-vitamin D (OSCAL WITH D) 500-200 MG-UNIT per tablet Take 1 tablet by mouth daily.     Cholecalciferol (VITAMIN D-3) 25 MCG (1000 UT) CAPS Take 1,000 Units by mouth in the morning.     Cranberry 500 MG TABS Take 500 mg by mouth in  the morning and at bedtime.     docusate sodium (COLACE) 100 MG capsule Take 200 mg by mouth at bedtime.     FLONASE 50 MCG/ACT nasal spray Place 2 sprays into both nostrils See admin instructions. Instill 2 sprays into each nostril at bedtime when not using Nasacort     glipiZIDE (GLIPIZIDE XL) 10 MG 24 hr tablet Take 1 tablet (10 mg total) by mouth daily with breakfast. 90 tablet 3   guaiFENesin (MUCINEX) 600 MG 12 hr tablet Take 2 tablets (1,200 mg total) by  mouth 2 (two) times daily. 20 tablet 0   hydrALAZINE (APRESOLINE) 100 MG tablet Take 1 tablet (100 mg total) by mouth 3 (three) times daily. 270 tablet 3   metoprolol succinate (TOPROL-XL) 25 MG 24 hr tablet Take 0.5 tablets (12.5 mg total) by mouth 2 (two) times daily. (Patient taking differently: Take 12.5 mg by mouth in the morning and at bedtime.) 90 tablet 3   mirtazapine (REMERON) 15 MG tablet Take 1 tablet (15 mg total) by mouth at bedtime. 90 tablet 3   Multiple Vitamin (MULTIVITAMIN) capsule Take 1 capsule by mouth daily.     Multiple Vitamins-Minerals (PRESERVISION AREDS 2+MULTI VIT) CAPS Take 1 capsule by mouth in the morning and at bedtime.     Olopatadine HCl (PATADAY OP) Place 1 drop into both eyes 2 (two) times daily as needed (for itching or redness).     OneTouch Delica Lancets 52D MISC CHECK BLOOD SUGAR ONCE DAILY AND AS DIRECTED 100 each 2   ONETOUCH ULTRA test strip CHECK BLOOD SUGAR ONCE DAILY AND AS DIRECTED. DX E11.9 100 strip 1   pioglitazone (ACTOS) 15 MG tablet Take 1 tablet (15 mg total) by mouth daily. 30 tablet 5   predniSONE (DELTASONE) 20 MG tablet Take 2 tablets (40 mg total) by mouth daily with breakfast. 4 tablet 0   sertraline (ZOLOFT) 50 MG tablet TAKE 1 TABLET BY MOUTH EVERY DAY (Patient taking differently: Take 50 mg by mouth in the morning.) 90 tablet 1   sitaGLIPtin (JANUVIA) 100 MG tablet Take 1 tablet (100 mg total) by mouth daily. 90 tablet 3   trandolapril (MAVIK) 4 MG tablet Take 1 tablet (4 mg  total) by mouth daily. 90 tablet 3   triamcinolone (NASACORT) 55 MCG/ACT AERO nasal inhaler Place 2 sprays into the nose daily. (Patient taking differently: Place 2 sprays into the nose See admin instructions. Instill 2 sprays into each nostril at bedtime when not using Flonase) 1 each 5   vitamin B-12 (CYANOCOBALAMIN) 1000 MCG tablet Take 1,000 mcg by mouth daily.      No current facility-administered medications on file prior to visit.   Allergies  Allergen Reactions   Tape Other (See Comments)    The patient's skin is VERY THIN- will TEAR EASILY!!   Diclofenac Sodium Swelling and Other (See Comments)    Angioedema   Pioglitazone Swelling and Other (See Comments)    Re-started in 2022   Social History   Socioeconomic History   Marital status: Widowed    Spouse name: Not on file   Number of children: Not on file   Years of education: Not on file   Highest education level: Not on file  Occupational History   Not on file  Tobacco Use   Smoking status: Never   Smokeless tobacco: Never  Vaping Use   Vaping Use: Not on file  Substance and Sexual Activity   Alcohol use: No    Alcohol/week: 0.0 standard drinks   Drug use: No   Sexual activity: Never  Other Topics Concern   Not on file  Social History Narrative   Not on file   Social Determinants of Health   Financial Resource Strain: Low Risk    Difficulty of Paying Living Expenses: Not hard at all  Food Insecurity: No Food Insecurity   Worried About Charity fundraiser in the Last Year: Never true   [place] in the Last Year: Never true  Transportation Needs: No Transportation Needs  Lack of Transportation (Medical): No   Lack of Transportation (Non-Medical): No  Physical Activity: Inactive   Days of Exercise per Week: 0 days   Minutes of Exercise per Session: 0 min  Stress: No Stress Concern Present   Feeling of Stress : Not at all  Social Connections: Socially Isolated   Frequency of Communication with  Friends and Family: More than three times a week   Frequency of Social Gatherings with Friends and Family: Once a week   Attends Religious Services: Never   Marine scientist or Organizations: No   Attends Archivist Meetings: Never   Marital Status: Widowed  Human resources officer Violence: Not At Risk   Fear of Current or Ex-Partner: No   Emotionally Abused: No   Physically Abused: No   Sexually Abused: No      Review of Systems  Musculoskeletal:  Positive for back pain.  All other systems reviewed and are negative.     Objective:   Physical Exam Vitals reviewed.  Constitutional:      General: She is not in acute distress.    Appearance: Normal appearance. She is obese. She is not ill-appearing, toxic-appearing or diaphoretic.  HENT:     Right Ear: Tympanic membrane and ear canal normal.     Left Ear: Tympanic membrane and ear canal normal.     Nose: Nose normal. No congestion or rhinorrhea.  Cardiovascular:     Rate and Rhythm: Normal rate and regular rhythm.     Heart sounds: Normal heart sounds. No murmur heard.   No friction rub. No gallop.  Pulmonary:     Effort: No tachypnea or respiratory distress.     Breath sounds: No stridor or decreased air movement. Decreased breath sounds and wheezing present. No rhonchi or rales.  Musculoskeletal:     Right lower leg: No edema.     Left lower leg: No edema.  Neurological:     Mental Status: She is alert and oriented to person, place, and time. Mental status is at baseline.     Sensory: No sensory deficit.     Motor: Weakness present.     Coordination: Coordination abnormal.     Gait: Gait abnormal.  Chronic left facial droop due to her previous remote right basal ganglial infarct        Assessment & Plan:  Mild persistent asthma with exacerbation Patient appears to be having an asthma exacerbation due to her recent infection.  She states that she does have a history of asthma.  However her hands are so  weak she is unable to use a metered-dose inhaler effectively.  I monitor her technique and her fingers are not strong enough to depress the device to produce the mist.  Therefore she would do better with a dry powdered inhaler.  Although this is not textbook, I am going to start her on Trelegy 1 inhalation a day for the next week and see if this will help dissipate this reaction without having to put her on additional oral prednisone.  Reassess in 1 week or sooner if worsening

## 2021-07-01 ENCOUNTER — Other Ambulatory Visit: Payer: Self-pay | Admitting: Family Medicine

## 2021-07-03 ENCOUNTER — Telehealth: Payer: Self-pay | Admitting: *Deleted

## 2021-07-03 NOTE — Telephone Encounter (Signed)
Received call from [place], [facility] PT with [facility] [phone] telephone.   Requested VO to extend [facility] PT services 1x weekly x6 weeks for strengthening.   VO given.

## 2021-07-04 ENCOUNTER — Telehealth: Payer: Self-pay | Admitting: *Deleted

## 2021-07-04 DIAGNOSIS — I129 Hypertensive chronic kidney disease with stage 1 through stage 4 chronic kidney disease, or unspecified chronic kidney disease: Secondary | ICD-10-CM | POA: Diagnosis not present

## 2021-07-04 DIAGNOSIS — R69 Illness, unspecified: Secondary | ICD-10-CM | POA: Diagnosis not present

## 2021-07-04 DIAGNOSIS — I69354 Hemiplegia and hemiparesis following cerebral infarction affecting left non-dominant side: Secondary | ICD-10-CM | POA: Diagnosis not present

## 2021-07-04 DIAGNOSIS — E785 Hyperlipidemia, unspecified: Secondary | ICD-10-CM | POA: Diagnosis not present

## 2021-07-04 DIAGNOSIS — E11319 Type 2 diabetes mellitus with unspecified diabetic retinopathy without macular edema: Secondary | ICD-10-CM | POA: Diagnosis not present

## 2021-07-04 DIAGNOSIS — J069 Acute upper respiratory infection, unspecified: Secondary | ICD-10-CM | POA: Diagnosis not present

## 2021-07-04 DIAGNOSIS — E1122 Type 2 diabetes mellitus with diabetic chronic kidney disease: Secondary | ICD-10-CM | POA: Diagnosis not present

## 2021-07-04 DIAGNOSIS — J9601 Acute respiratory failure with hypoxia: Secondary | ICD-10-CM | POA: Diagnosis not present

## 2021-07-04 DIAGNOSIS — B341 Enterovirus infection, unspecified: Secondary | ICD-10-CM | POA: Diagnosis not present

## 2021-07-04 DIAGNOSIS — N184 Chronic kidney disease, stage 4 (severe): Secondary | ICD-10-CM | POA: Diagnosis not present

## 2021-07-04 NOTE — Telephone Encounter (Signed)
Received call from [place], [facility] SN with [facility].   Reports that patient is switching to [facility] for services as [facility] can offer OT.   Requested VO to D/C services this week. VO given.

## 2021-07-05 ENCOUNTER — Telehealth: Payer: Self-pay | Admitting: *Deleted

## 2021-07-05 ENCOUNTER — Other Ambulatory Visit: Payer: Self-pay | Admitting: Family Medicine

## 2021-07-05 ENCOUNTER — Other Ambulatory Visit: Payer: Self-pay

## 2021-07-05 NOTE — Chronic Care Management (AMB) (Signed)
  Chronic Care Management   Note  07/05/2021 Name: Renee [name] MRN: [id number] DOB: [birth year]  Renee [name] is a 87 y.o. year old female who is a primary care patient of Pickard, Cammie Mcgee, MD. I reached out to [employer] by phone today in response to a referral sent by [name]'s PCP.  [name] was given information about Chronic Care Management services today including:  CCM service includes personalized support from designated clinical staff supervised by her physician, including individualized plan of care and coordination with other care providers 24/7 contact phone numbers for assistance for urgent and routine care needs. Service will only be billed when office clinical staff spend 20 minutes or more in a month to coordinate care. Only one practitioner may furnish and bill the service in a calendar month. The patient may stop CCM services at any time (effective at the end of the month) by phone call to the office staff. The patient is responsible for co-pay (up to 20% after annual deductible is met) if co-pay is required by the individual health plan.   Patient agreed to services and verbal consent obtained.   Follow up plan:Telephone appointment with care management team member scheduled for:07/10/21  [facility]  Direct Dial: [phone]

## 2021-07-06 DIAGNOSIS — E1122 Type 2 diabetes mellitus with diabetic chronic kidney disease: Secondary | ICD-10-CM | POA: Diagnosis not present

## 2021-07-06 DIAGNOSIS — I69354 Hemiplegia and hemiparesis following cerebral infarction affecting left non-dominant side: Secondary | ICD-10-CM | POA: Diagnosis not present

## 2021-07-06 DIAGNOSIS — I129 Hypertensive chronic kidney disease with stage 1 through stage 4 chronic kidney disease, or unspecified chronic kidney disease: Secondary | ICD-10-CM | POA: Diagnosis not present

## 2021-07-06 DIAGNOSIS — E11319 Type 2 diabetes mellitus with unspecified diabetic retinopathy without macular edema: Secondary | ICD-10-CM | POA: Diagnosis not present

## 2021-07-06 DIAGNOSIS — N184 Chronic kidney disease, stage 4 (severe): Secondary | ICD-10-CM | POA: Diagnosis not present

## 2021-07-06 DIAGNOSIS — B341 Enterovirus infection, unspecified: Secondary | ICD-10-CM | POA: Diagnosis not present

## 2021-07-06 DIAGNOSIS — J069 Acute upper respiratory infection, unspecified: Secondary | ICD-10-CM | POA: Diagnosis not present

## 2021-07-06 DIAGNOSIS — R69 Illness, unspecified: Secondary | ICD-10-CM | POA: Diagnosis not present

## 2021-07-06 DIAGNOSIS — E785 Hyperlipidemia, unspecified: Secondary | ICD-10-CM | POA: Diagnosis not present

## 2021-07-06 DIAGNOSIS — J9601 Acute respiratory failure with hypoxia: Secondary | ICD-10-CM | POA: Diagnosis not present

## 2021-07-10 ENCOUNTER — Encounter: Payer: Self-pay | Admitting: Nurse Practitioner

## 2021-07-10 ENCOUNTER — Other Ambulatory Visit: Payer: Self-pay

## 2021-07-10 ENCOUNTER — Telehealth: Payer: Self-pay | Admitting: Family Medicine

## 2021-07-10 ENCOUNTER — Ambulatory Visit (INDEPENDENT_AMBULATORY_CARE_PROVIDER_SITE_OTHER): Payer: Medicare HMO | Admitting: *Deleted

## 2021-07-10 ENCOUNTER — Telehealth (INDEPENDENT_AMBULATORY_CARE_PROVIDER_SITE_OTHER): Payer: Medicare HMO | Admitting: Nurse Practitioner

## 2021-07-10 VITALS — BP 171/84 | HR 58

## 2021-07-10 DIAGNOSIS — J4531 Mild persistent asthma with (acute) exacerbation: Secondary | ICD-10-CM | POA: Diagnosis not present

## 2021-07-10 DIAGNOSIS — E1165 Type 2 diabetes mellitus with hyperglycemia: Secondary | ICD-10-CM

## 2021-07-10 DIAGNOSIS — I10 Essential (primary) hypertension: Secondary | ICD-10-CM

## 2021-07-10 MED ORDER — PREDNISONE 10 MG PO TABS: ORAL_TABLET | ORAL | 0 refills | Status: DC

## 2021-07-10 MED ORDER — ALBUTEROL SULFATE (2.5 MG/3ML) 0.083% IN NEBU: 2.5000 mg | INHALATION_SOLUTION | RESPIRATORY_TRACT | 0 refills | Status: DC | PRN

## 2021-07-10 NOTE — Chronic Care Management (AMB) (Signed)
Chronic Care Management   CCM RN Visit Note  07/10/2021 Name: Renee [name] MRN: [id number] DOB: 12-15-1928  Subjective: Renee [name] is a 87 y.o. year old female who is a primary care patient of Pickard, Cammie Mcgee, MD. The care management team was consulted for assistance with disease management and care coordination needs.    Engaged with patient by telephone for initial visit in response to provider referral for case management and/or care coordination services.   Consent to Services:  The patient was given the following information about Chronic Care Management services today, agreed to services, and gave verbal consent: 1. CCM service includes personalized support from designated clinical staff supervised by the primary care provider, including individualized plan of care and coordination with other care providers 2. 24/7 contact phone numbers for assistance for urgent and routine care needs. 3. Service will only be billed when office clinical staff spend 20 minutes or more in a month to coordinate care. 4. Only one practitioner may furnish and bill the service in a calendar month. 5.The patient may stop CCM services at any time (effective at the end of the month) by phone call to the office staff. 6. The patient will be responsible for cost sharing (co-pay) of up to 20% of the service fee (after annual deductible is met). Patient agreed to services and consent obtained.  Patient agreed to services and verbal consent obtained.   Assessment: Review of patient past medical history, allergies, medications, health status, including review of consultants reports, laboratory and other test data, was performed as part of comprehensive evaluation and provision of chronic care management services.   SDOH (Social Determinants of Health) assessments and interventions performed:  SDOH Interventions    Flowsheet Row Most Recent Value  SDOH Interventions   Food Insecurity Interventions Intervention  Not Indicated  Transportation Interventions Intervention Not Indicated        CCM Care Plan  Allergies  Allergen Reactions   Tape Other (See Comments)    The patient's skin is VERY THIN- will TEAR EASILY!!   Diclofenac Sodium Swelling and Other (See Comments)    Angioedema   Pioglitazone Swelling and Other (See Comments)    Re-started in 2022    Outpatient Encounter Medications as of 07/10/2021  Medication Sig   acetaminophen (TYLENOL) 500 MG tablet Take 1,000 mg by mouth at bedtime.   albuterol (PROVENTIL) (2.5 MG/3ML) 0.083% nebulizer solution Take 3 mLs (2.5 mg total) by nebulization every 2 (two) hours as needed for shortness of breath.   albuterol (VENTOLIN HFA) 108 (90 Base) MCG/ACT inhaler INHALE 2 PUFFS INTO THE LUNGS EVERY 4 HOURS AS NEEDED FOR WHEEZING/SHORTNESS OF BREATH (Patient taking differently: Inhale 2 puffs into the lungs every 4 (four) hours as needed for shortness of breath or wheezing.)   ALPRAZolam (XANAX) 0.5 MG tablet TAKE 1/2 TO 1 TABLET BY MOUTH TWICE A DAY AS NEEDED FOR ANXIETY (Patient taking differently: Take 0.25-0.5 mg by mouth 2 (two) times daily as needed for anxiety.)   amLODipine (NORVASC) 10 MG tablet Take 1 tablet (10 mg total) by mouth daily.   aspirin EC 325 MG tablet Take 325 mg by mouth in the morning.   Blood Glucose Monitoring Suppl (ONE TOUCH ULTRA 2) w/Device KIT Check blood sugar once daily and as directed. Dx E11.9   calcium-vitamin D (OSCAL WITH D) 500-200 MG-UNIT per tablet Take 1 tablet by mouth daily.   Cholecalciferol (VITAMIN D-3) 25 MCG (1000 UT) CAPS Take 1,000 Units by  mouth in the morning.   Cranberry 500 MG TABS Take 500 mg by mouth in the morning and at bedtime.   docusate sodium (COLACE) 100 MG capsule Take 200 mg by mouth at bedtime.   estradiol (ESTRACE) 0.1 MG/GM vaginal cream PLEASE SEE ATTACHED FOR DETAILED DIRECTIONS   FLONASE 50 MCG/ACT nasal spray Place 2 sprays into both nostrils See admin instructions. Instill 2  sprays into each nostril at bedtime when not using Nasacort   glipiZIDE (GLIPIZIDE XL) 10 MG 24 hr tablet Take 1 tablet (10 mg total) by mouth daily with breakfast.   guaiFENesin (MUCINEX) 600 MG 12 hr tablet Take 2 tablets (1,200 mg total) by mouth 2 (two) times daily.   hydrALAZINE (APRESOLINE) 100 MG tablet Take 1 tablet (100 mg total) by mouth 3 (three) times daily.   metoprolol succinate (TOPROL-XL) 25 MG 24 hr tablet Take 0.5 tablets (12.5 mg total) by mouth 2 (two) times daily. (Patient taking differently: Take 12.5 mg by mouth in the morning and at bedtime.)   mirtazapine (REMERON) 15 MG tablet Take 1 tablet (15 mg total) by mouth at bedtime.   Multiple Vitamin (MULTIVITAMIN) capsule Take 1 capsule by mouth daily.   Multiple Vitamins-Minerals (PRESERVISION AREDS 2+MULTI VIT) CAPS Take 1 capsule by mouth in the morning and at bedtime.   Olopatadine HCl (PATADAY OP) Place 1 drop into both eyes 2 (two) times daily as needed (for itching or redness).   OneTouch Delica Lancets 51W MISC CHECK BLOOD SUGAR ONCE DAILY AND AS DIRECTED   ONETOUCH ULTRA test strip CHECK BLOOD SUGAR ONCE DAILY AND AS DIRECTED. DX E11.9   pioglitazone (ACTOS) 15 MG tablet Take 1 tablet (15 mg total) by mouth daily.   sertraline (ZOLOFT) 50 MG tablet TAKE 1 TABLET BY MOUTH EVERY DAY   sitaGLIPtin (JANUVIA) 100 MG tablet Take 1 tablet (100 mg total) by mouth daily.   trandolapril (MAVIK) 4 MG tablet Take 1 tablet (4 mg total) by mouth daily.   triamcinolone (NASACORT) 55 MCG/ACT AERO nasal inhaler Place 2 sprays into the nose daily. (Patient taking differently: Place 2 sprays into the nose See admin instructions. Instill 2 sprays into each nostril at bedtime when not using Flonase)   vitamin B-12 (CYANOCOBALAMIN) 1000 MCG tablet Take 1,000 mcg by mouth daily.    predniSONE (DELTASONE) 10 MG tablet Take 3 tablets (30 mg) daily for 2 days, then, Take 2 tablets (20 mg) daily for 2 days, then, Take 1 tablets (10 mg) daily for 1  days, then stop. (Patient not taking: Reported on 07/10/2021)   [DISCONTINUED] albuterol (PROVENTIL) (2.5 MG/3ML) 0.083% nebulizer solution Take 3 mLs (2.5 mg total) by nebulization every 2 (two) hours as needed for shortness of breath.   [DISCONTINUED] predniSONE (DELTASONE) 20 MG tablet Take 2 tablets (40 mg total) by mouth daily with breakfast.   No facility-administered encounter medications on file as of 07/10/2021.    Patient Active Problem List   Diagnosis Date Noted   Acute respiratory failure with hypoxia ([place]) 06/23/2021   Pneumothorax 08/09/2020   Pleural effusion 05/13/2020   Frequent UTI 05/13/2020   Dizziness 02/12/2020   Normocytic anemia 02/12/2020   Fall involving sidewalk curb 11/29/2019   Contusion of back 11/27/2019   Hyperlipidemia associated with type 2 diabetes mellitus ([place])    Aortic atherosclerosis ([place]) 07/13/2019   H/O sepsis 07/01/2019   CKD (chronic kidney disease) stage 4, GFR 15-29 ml/min ([place]) 07/01/2019   Lower abdominal pain 05/11/2019   Routine general  medical examination at a health care facility 03/30/2019   External hemorrhoid 01/17/2018   History of colitis 01/17/2018   Generalized weakness 12/24/2016   Diabetic retinopathy ([place]) 12/11/2016   History of CVA (cerebrovascular accident) 12/11/2016   Community acquired pneumonia of left lower lobe of lung 10/07/2015   Estrogen deficiency 08/30/2015   Encounter for Medicare annual wellness exam 04/17/2013   Chest wall pain 04/07/2013   Mobility impaired 06/26/2011   History of retinal detachment 01/10/2011   Sleep apnea 11/28/2010   Anxiety and depression 08/25/2010   Hemiplegia, late effect of cerebrovascular disease ([place]) 07/05/2010   POSTHERPETIC NEURALGIA 11/09/2009   Renal insufficiency 06/29/2008   Chronic back pain 01/26/2008   EDEMA 01/26/2008   B12 deficiency 01/10/2007   Type 2 diabetes, controlled, with retinopathy ([place]) 11/27/2006   Essential hypertension 11/27/2006    FIBROCYSTIC BREAST DISEASE 11/27/2006   ROSACEA 11/27/2006   OSTEOARTHRITIS 11/27/2006   URINARY INCONTINENCE, MIXED 11/27/2006    Conditions to be addressed/monitored:HTN and DMII  Care Plan : RN Care Manager plan of care  Updates made by Kassie Mends, RN since 07/10/2021 12:00 AM     Problem: No plan of care established for management of chronic disease states (DM2, HTN, CKD stage 4)   Priority: High     Long-Range Goal: Development of plan of care for chronic disease management (DM2, HTN, CKD stage 4)   Start Date: 07/10/2021  Expected End Date: 01/06/2022  Priority: High  Note:   Current Barriers:  Knowledge Deficits related to plan of care for management of HTN and DMII - patient reports she lives alone, has hired help daily from 9 am- 5 pm, has sister in law she can call for assistance if needed, pt does not drive and needs assistance with ADL's such as bathing, cooking, medication oversight etc and her aide assists.  Patient reports she checks CBG once daily with readings 150-160's range with occasional 200 if "I eat something I shouldn't", checks blood pressure daily and reports readings " are usually good"  Patient reports she has advanced directives with her sister Frazier Butt as HCPOA.  Patient reports home health PT and OT working with her twice weekly.  Patient needs assistance with ongoing teaching/ reinforcement of diabetes/ HTN management and diet.   RNCM Clinical Goal(s):  Patient will verbalize understanding of plan for management of HTN and DMII as evidenced by patient report, review of EHR  through collaboration with RN Care manager, provider, and care team.   Interventions: 1:1 collaboration with primary care provider regarding development and update of comprehensive plan of care as evidenced by provider attestation and co-signature Inter-disciplinary care team collaboration (see longitudinal plan of care) Evaluation of current treatment plan related to  self  management and patient's adherence to plan as established by provider  Hypertension Interventions: Last practice recorded BP readings:  BP Readings from Last 3 Encounters:  07/10/21 (!) 171/84  06/30/21 134/62  06/26/21 (!) 143/65  Most recent eGFR/CrCl:  Lab Results  Component Value Date   EGFR 27 (L) 05/26/2021    No components found for: CRCL  Evaluation of current treatment plan related to hypertension self management and patient's adherence to plan as established by provider; Provided education to patient re: stroke prevention, s/s of heart attack and stroke; Reviewed medications with patient and discussed importance of compliance; Discussed plans with patient for ongoing care management follow up and provided patient with direct contact information for care management team; Advised patient, providing  education and rationale, to monitor blood pressure daily and record, calling PCP for findings outside established parameters;  Discussed complications of poorly controlled blood pressure such as heart disease, stroke, circulatory complications, vision complications, kidney impairment, sexual dysfunction;  Assessed social determinant of health barriers;  Education sent via My Chart- low sodium diet Reviewed low sodium diet, foods to avoid/ limit  Diabetes Interventions: Assessed patient's understanding of A1c goal: <7% Provided education to patient about basic DM disease process; Reviewed medications with patient and discussed importance of medication adherence; Counseled on importance of regular laboratory monitoring as prescribed; Discussed plans with patient for ongoing care management follow up and provided patient with direct contact information for care management team; Provided patient with written educational materials related to hypo and hyperglycemia and importance of correct treatment; Review of patient status, including review of consultants reports, relevant laboratory  and other test results, and medications completed; Screening for signs and symptoms of depression related to chronic disease state;  Reviewed carbohydrate modified diet Education sent via My Chart- hypoglycemia  Lab Results  Component Value Date   HGBA1C 8.3 (H) 05/24/2021    Patient Goals/Self-Care Activities: check blood sugar at prescribed times: once daily check feet daily for cuts, sores or redness enter blood sugar readings and medication or insulin into daily log take the blood sugar log to all doctor visits take the blood sugar meter to all doctor visits fill half of plate with vegetables manage portion size check blood pressure daily write blood pressure results in a log or diary keep a blood pressure log take blood pressure log to all doctor appointments take medications for blood pressure exactly as prescribed Follow low sodium diet- avoid fast food and salty snacks Look over education sent via My Chart low sodium diet and hypoglycemia Try to walk some daily   Follow Up Plan:  Telephone follow up appointment with care management team member scheduled for:  08/25/2021       Plan:Telephone follow up appointment with care management team member scheduled for:  08/25/2021  Jacqlyn Larsen [facility], BSN RN Case Manager [facility] [phone]

## 2021-07-10 NOTE — Telephone Encounter (Signed)
Spoke with Lucretia/[facility]. She is re-faxing Plan of Care orders over to be signed. Advised her Dr. Dennard Schaumann is out this week, she states no rush. Will review fax once received and return once signed.

## 2021-07-10 NOTE — Progress Notes (Signed)
Subjective:    Patient ID: Renee [name], female    DOB: [birth year], 87 y.o.   MRN: [id number]  HPI: Renee [name] is a 87 y.o. female presenting virtually with caregiver, [facility], for ongoing coughing, shortness of breath, and wheezing.   Chief Complaint  Patient presents with   Wheezing   UPPER RESPIRATORY TRACT INFECTION Started Trelegy about 10 days ago.  Caregiver does not notice much of a difference.  Has run out of albuterol nebulizer.  Onset: 06/19/21 Fever: no Cough: no; dry/hacky Shortness of breath: yes Wheezing: yes Chest pain: no Chest tightness: no Chest congestion: yes Nasal congestion: no Runny nose: no Post nasal drip: no Sneezing: no Sore throat: no Swollen glands: no Sinus pressure: no Headache: no Face pain: no Toothache: no Ear pain: no  Ear pressure: no  Eyes red/itching:no Eye drainage/crusting: no  Nausea: no  Vomiting: no Diarrhea: no  Change in appetite: yes; slightly decreased Loss of taste/smell: no  Rash: no Fatigue: no Context: stable  Allergies  Allergen Reactions   Tape Other (See Comments)    The patient's skin is VERY THIN- will TEAR EASILY!!   Diclofenac Sodium Swelling and Other (See Comments)    Angioedema   Pioglitazone Swelling and Other (See Comments)    Re-started in 2022    Outpatient Encounter Medications as of 07/10/2021  Medication Sig   acetaminophen (TYLENOL) 500 MG tablet Take 1,000 mg by mouth at bedtime.   albuterol (PROVENTIL) (2.5 MG/3ML) 0.083% nebulizer solution Take 3 mLs (2.5 mg total) by nebulization every 2 (two) hours as needed for shortness of breath.   albuterol (VENTOLIN HFA) 108 (90 Base) MCG/ACT inhaler INHALE 2 PUFFS INTO THE LUNGS EVERY 4 HOURS AS NEEDED FOR WHEEZING/SHORTNESS OF BREATH (Patient taking differently: Inhale 2 puffs into the lungs every 4 (four) hours as needed for shortness of breath or wheezing.)   ALPRAZolam (XANAX) 0.5 MG tablet TAKE 1/2 TO 1 TABLET BY MOUTH TWICE A  DAY AS NEEDED FOR ANXIETY (Patient taking differently: Take 0.25-0.5 mg by mouth 2 (two) times daily as needed for anxiety.)   amLODipine (NORVASC) 10 MG tablet Take 1 tablet (10 mg total) by mouth daily.   aspirin EC 325 MG tablet Take 325 mg by mouth in the morning.   Blood Glucose Monitoring Suppl (ONE TOUCH ULTRA 2) w/Device KIT Check blood sugar once daily and as directed. Dx E11.9   calcium-vitamin D (OSCAL WITH D) 500-200 MG-UNIT per tablet Take 1 tablet by mouth daily.   Cholecalciferol (VITAMIN D-3) 25 MCG (1000 UT) CAPS Take 1,000 Units by mouth in the morning.   Cranberry 500 MG TABS Take 500 mg by mouth in the morning and at bedtime.   docusate sodium (COLACE) 100 MG capsule Take 200 mg by mouth at bedtime.   estradiol (ESTRACE) 0.1 MG/GM vaginal cream PLEASE SEE ATTACHED FOR DETAILED DIRECTIONS   FLONASE 50 MCG/ACT nasal spray Place 2 sprays into both nostrils See admin instructions. Instill 2 sprays into each nostril at bedtime when not using Nasacort   glipiZIDE (GLIPIZIDE XL) 10 MG 24 hr tablet Take 1 tablet (10 mg total) by mouth daily with breakfast.   guaiFENesin (MUCINEX) 600 MG 12 hr tablet Take 2 tablets (1,200 mg total) by mouth 2 (two) times daily.   hydrALAZINE (APRESOLINE) 100 MG tablet Take 1 tablet (100 mg total) by mouth 3 (three) times daily.   metoprolol succinate (TOPROL-XL) 25 MG 24 hr tablet Take 0.5 tablets (12.5  mg total) by mouth 2 (two) times daily. (Patient taking differently: Take 12.5 mg by mouth in the morning and at bedtime.)   mirtazapine (REMERON) 15 MG tablet Take 1 tablet (15 mg total) by mouth at bedtime.   Multiple Vitamin (MULTIVITAMIN) capsule Take 1 capsule by mouth daily.   Multiple Vitamins-Minerals (PRESERVISION AREDS 2+MULTI VIT) CAPS Take 1 capsule by mouth in the morning and at bedtime.   Olopatadine HCl (PATADAY OP) Place 1 drop into both eyes 2 (two) times daily as needed (for itching or redness).   OneTouch Delica Lancets 14E MISC CHECK  BLOOD SUGAR ONCE DAILY AND AS DIRECTED   ONETOUCH ULTRA test strip CHECK BLOOD SUGAR ONCE DAILY AND AS DIRECTED. DX E11.9   pioglitazone (ACTOS) 15 MG tablet Take 1 tablet (15 mg total) by mouth daily.   predniSONE (DELTASONE) 10 MG tablet Take 3 tablets (30 mg) daily for 2 days, then, Take 2 tablets (20 mg) daily for 2 days, then, Take 1 tablets (10 mg) daily for 1 days, then stop.   sertraline (ZOLOFT) 50 MG tablet TAKE 1 TABLET BY MOUTH EVERY DAY   sitaGLIPtin (JANUVIA) 100 MG tablet Take 1 tablet (100 mg total) by mouth daily.   trandolapril (MAVIK) 4 MG tablet Take 1 tablet (4 mg total) by mouth daily.   triamcinolone (NASACORT) 55 MCG/ACT AERO nasal inhaler Place 2 sprays into the nose daily. (Patient taking differently: Place 2 sprays into the nose See admin instructions. Instill 2 sprays into each nostril at bedtime when not using Flonase)   vitamin B-12 (CYANOCOBALAMIN) 1000 MCG tablet Take 1,000 mcg by mouth daily.    [DISCONTINUED] albuterol (PROVENTIL) (2.5 MG/3ML) 0.083% nebulizer solution Take 3 mLs (2.5 mg total) by nebulization every 2 (two) hours as needed for shortness of breath.   [DISCONTINUED] predniSONE (DELTASONE) 20 MG tablet Take 2 tablets (40 mg total) by mouth daily with breakfast.   No facility-administered encounter medications on file as of 07/10/2021.    Patient Active Problem List   Diagnosis Date Noted   Acute respiratory failure with hypoxia ([place]) 06/23/2021   Pneumothorax 08/09/2020   Pleural effusion 05/13/2020   Frequent UTI 05/13/2020   Dizziness 02/12/2020   Normocytic anemia 02/12/2020   Fall involving sidewalk curb 11/29/2019   Contusion of back 11/27/2019   Hyperlipidemia associated with type 2 diabetes mellitus ([place])    Aortic atherosclerosis ([place]) 07/13/2019   H/O sepsis 07/01/2019   CKD (chronic kidney disease) stage 4, GFR 15-29 ml/min ([place]) 07/01/2019   Lower abdominal pain 05/11/2019   Routine general medical examination at a health care  facility 03/30/2019   External hemorrhoid 01/17/2018   History of colitis 01/17/2018   Generalized weakness 12/24/2016   Diabetic retinopathy ([place]) 12/11/2016   History of CVA (cerebrovascular accident) 12/11/2016   Community acquired pneumonia of left lower lobe of lung 10/07/2015   Estrogen deficiency 08/30/2015   Encounter for Medicare annual wellness exam 04/17/2013   Chest wall pain 04/07/2013   Mobility impaired 06/26/2011   History of retinal detachment 01/10/2011   Sleep apnea 11/28/2010   Anxiety and depression 08/25/2010   Hemiplegia, late effect of cerebrovascular disease ([place]) 07/05/2010   POSTHERPETIC NEURALGIA 11/09/2009   Renal insufficiency 06/29/2008   Chronic back pain 01/26/2008   EDEMA 01/26/2008   B12 deficiency 01/10/2007   Type 2 diabetes, controlled, with retinopathy ([place]) 11/27/2006   Essential hypertension 11/27/2006   FIBROCYSTIC BREAST DISEASE 11/27/2006   ROSACEA 11/27/2006   OSTEOARTHRITIS 11/27/2006  URINARY INCONTINENCE, MIXED 11/27/2006    Past Medical History:  Diagnosis Date   Angioedema    possibly from voltaren   Bronchopneumonia 12/11/2016   Degenerative disc disease    Diabetes mellitus    type II   Hyperlipidemia    Hypertension    LVH (left ventricular hypertrophy)    and atrial enlargement by echo in past with nl EF   Nasal pruritis    Osteoarthritis    Osteopenia    Renal insufficiency    Sleep apnea    Stroke ([place]) 05/2010   Small vessel sobcortical (in [place] trial) with Dr Leonie Man, residual L hemiparesis   Vitamin B 12 deficiency 04/08    Relevant past medical, surgical, family and social history reviewed and updated as indicated. Interim medical history since our last visit reviewed.  Review of Systems Per HPI unless specifically indicated above     Objective:    BP (!) 171/84   Pulse (!) 58   SpO2 93%   Wt Readings from Last 3 Encounters:  06/26/21 179 lb 0.2 oz (81.2 kg)  05/24/21 170 lb (77.1 kg)  02/28/21  171 lb (77.6 kg)    Physical Exam  Physical examination unable to be performed due to lack of equipment.     Assessment & Plan:  1. Mild persistent asthma with exacerbation Chronic, exacerbation sounds stable.  Will refill albuterol nebulizer.  Encouraged aggressive pulmonary toilet - continue Mucinex, deep breathing/incentive spirometer and/or flutter valve.  Also resume prednisone and taper.  Return to clinic if symptoms do not improve.  Seek urgent care if oxygen saturation drops and stays below 90% or if fever returns.   - albuterol (PROVENTIL) (2.5 MG/3ML) 0.083% nebulizer solution; Take 3 mLs (2.5 mg total) by nebulization every 2 (two) hours as needed for shortness of breath.  Dispense: 75 mL; Refill: 0 - predniSONE (DELTASONE) 10 MG tablet; Take 3 tablets (30 mg) daily for 2 days, then, Take 2 tablets (20 mg) daily for 2 days, then, Take 1 tablets (10 mg) daily for 1 days, then stop.  Dispense: 12 tablet; Refill: 0  Follow up plan: Return if symptoms worsen or fail to improve.  This visit was completed via telephone due to the restrictions of the COVID-19 pandemic. All issues as above were discussed and addressed but no physical exam was performed. If it was felt that the patient should be evaluated in the office, they were directed there. The patient verbally consented to this visit. Patient was unable to complete an audio/visual visit due to Technical difficulties. Link sent to multiple numbers, however text message never received due to poor internet connection. Location of the patient: home Location of the provider: work Those involved with this call:  Provider: Noemi Chapel, DNP, FNP-C CMA: n/a Front Desk/Registration: Santina Evans  Time spent on call:  7 minutes on the phone discussing health concerns. 15 minutes total spent in review of patient's record and preparation of their chart. I verified patient identity using two factors (patient name and date of birth). Patient  consents verbally to being seen via telemedicine visit today.

## 2021-07-10 NOTE — Patient Instructions (Addendum)
Visit Information   Thank you for taking time to visit with me today. Please don't hesitate to contact me if I can be of assistance to you before our next scheduled telephone appointment.  Following are the goals we discussed today:    Our next appointment is by telephone on 08/25/2021 at 1130 pm  Please call the care guide team at [phone] if you need to cancel or reschedule your appointment.   Please call 911 go to [facility] [place] [phone]) call [phone] (toll free, 24 hour hotline)  Low-Sodium Eating Plan Sodium, which is an element that makes up salt, helps you maintain a healthy balance of fluids in your body. Too much sodium can increase your blood pressure and cause fluid and waste to be held in your body. Your health care provider or dietitian may recommend following this plan if you have high blood pressure (hypertension), kidney disease, liver disease, or heart failure. Eating less sodium can help lower your blood pressure, reduce swelling, and protect your heart, liver, and kidneys. What are tips for following this plan? Reading food labels The Nutrition Facts label lists the amount of sodium in one serving of the food. If you eat more than one serving, you must multiply the listed amount of sodium by the number of servings. Choose foods with less than 140 mg of sodium per serving. Avoid foods with 300 mg of sodium or more per serving. Shopping  Look for lower-sodium products, often labeled as "low-sodium" or "no salt added." Always check the sodium content, even if foods are labeled as "unsalted" or "no salt added." Buy fresh foods. Avoid canned foods and pre-made or frozen meals. Avoid canned, cured, or processed meats. Buy breads that have less than 80 mg of sodium per slice. Cooking  Eat more home-cooked food and less restaurant, buffet, and fast food. Avoid adding salt when cooking. Use salt-free  seasonings or herbs instead of table salt or sea salt. Check with your health care provider or pharmacist before using salt substitutes. Cook with plant-based oils, such as canola, sunflower, or olive oil. Meal planning When eating at a restaurant, ask that your food be prepared with less salt or no salt, if possible. Avoid dishes labeled as brined, pickled, cured, smoked, or made with soy sauce, miso, or teriyaki sauce. Avoid foods that contain MSG (monosodium glutamate). MSG is sometimes added to [place] food, bouillon, and some canned foods. Make meals that can be grilled, baked, poached, roasted, or steamed. These are generally made with less sodium. General information Most people on this plan should limit their sodium intake to 1,500-2,000 mg (milligrams) of sodium each day. What foods should I eat? Fruits Fresh, frozen, or canned fruit. Fruit juice. Vegetables Fresh or frozen vegetables. "No salt added" canned vegetables. "No salt added" tomato sauce and paste. Low-sodium or reduced-sodium tomato and vegetable juice. Grains Low-sodium cereals, including oats, puffed wheat and rice, and shredded wheat. Low-sodium crackers. Unsalted rice. Unsalted pasta. Low-sodium bread. Whole-grain breads and whole-grain pasta. Meats and other proteins Fresh or frozen (no salt added) meat, poultry, seafood, and fish. Low-sodium canned tuna and salmon. Unsalted nuts. Dried peas, beans, and lentils without added salt. Unsalted canned beans. Eggs. Unsalted nut butters. Dairy Milk. Soy milk. Cheese that is naturally low in sodium, such as ricotta cheese, fresh mozzarella, or Swiss cheese. Low-sodium or reduced-sodium cheese. Cream cheese. Yogurt. Seasonings and condiments Fresh and dried herbs and spices. Salt-free seasonings. Low-sodium mustard and  ketchup. Sodium-free salad dressing. Sodium-free light mayonnaise. Fresh or refrigerated horseradish. Lemon juice. Vinegar. Other foods Homemade, reduced-sodium,  or low-sodium soups. Unsalted popcorn and pretzels. Low-salt or salt-free chips. The items listed above may not be a complete list of foods and beverages you can eat. Contact a dietitian for more information. What foods should I avoid? Vegetables Sauerkraut, pickled vegetables, and relishes. Olives. [place] fries. Onion rings. Regular canned vegetables (not low-sodium or reduced-sodium). Regular canned tomato sauce and paste (not low-sodium or reduced-sodium). Regular tomato and vegetable juice (not low-sodium or reduced-sodium). Frozen vegetables in sauces. Grains Instant hot cereals. Bread stuffing, pancake, and biscuit mixes. Croutons. Seasoned rice or pasta mixes. Noodle soup cups. Boxed or frozen macaroni and cheese. Regular salted crackers. Self-rising flour. Meats and other proteins Meat or fish that is salted, canned, smoked, spiced, or pickled. Precooked or cured meat, such as sausages or meat loaves. Berniece Salines. Ham. Pepperoni. Hot dogs. Corned beef. Chipped beef. Salt pork. Jerky. Pickled herring. Anchovies and sardines. Regular canned tuna. Salted nuts. Dairy Processed cheese and cheese spreads. Hard cheeses. Cheese curds. Blue cheese. Feta cheese. String cheese. Regular cottage cheese. Buttermilk. Canned milk. Fats and oils Salted butter. Regular margarine. Ghee. Bacon fat. Seasonings and condiments Onion salt, garlic salt, seasoned salt, table salt, and sea salt. Canned and packaged gravies. Worcestershire sauce. Tartar sauce. Barbecue sauce. Teriyaki sauce. Soy sauce, including reduced-sodium. Steak sauce. Fish sauce. Oyster sauce. Cocktail sauce. Horseradish that you find on the shelf. Regular ketchup and mustard. Meat flavorings and tenderizers. Bouillon cubes. Hot sauce. Pre-made or packaged marinades. Pre-made or packaged taco seasonings. Relishes. Regular salad dressings. Salsa. Other foods Salted popcorn and pretzels. Corn chips and puffs. Potato and tortilla chips. Canned or dried  soups. Pizza. Frozen entrees and pot pies. The items listed above may not be a complete list of foods and beverages you should avoid. Contact a dietitian for more information. Summary Eating less sodium can help lower your blood pressure, reduce swelling, and protect your heart, liver, and kidneys. Most people on this plan should limit their sodium intake to 1,500-2,000 mg (milligrams) of sodium each day. Canned, boxed, and frozen foods are high in sodium. Restaurant foods, fast foods, and pizza are also very high in sodium. You also get sodium by adding salt to food. Try to cook at home, eat more fresh fruits and vegetables, and eat less fast food and canned, processed, or prepared foods. This information is not intended to replace advice given to you by your health care provider. Make sure you discuss any questions you have with your health care provider. Document Revised: 09/11/2019 Document Reviewed: 07/08/2019 Elsevier Patient Education  2022 [place]. Hypoglycemia Hypoglycemia is when the sugar (glucose) level in your blood is too low. Low blood sugar can happen to people who have diabetes and people who do not have diabetes. Low blood sugar can happen quickly, and it can be an emergency. What are the causes? This condition happens most often in people who have diabetes. It may be caused by: Diabetes medicine. Not eating enough, or not eating often enough. Doing more physical activity. Drinking alcohol on an empty stomach. If you do not have diabetes, this condition may be caused by: A tumor in the pancreas. Not eating enough, or not eating for long periods at a time (fasting). A very bad infection or illness. Problems after having weight loss (bariatric) surgery. Kidney failure or liver failure. Certain medicines. What increases the risk? This condition is more likely to  develop in people who: Have diabetes and take medicines to lower their blood sugar. Abuse alcohol. Have a  very bad illness. What are the signs or symptoms? Mild Hunger. Sweating and feeling clammy. Feeling dizzy or light-headed. Being sleepy or having trouble sleeping. Feeling like you may vomit (nauseous). A fast heartbeat. A headache. Blurry vision. Mood changes, such as: Being grouchy. Feeling worried or nervous (anxious). Tingling or loss of feeling (numbness) around your mouth, lips, or tongue. Moderate Confusion and poor judgment. Behavior changes. Weakness. Uneven heartbeat. Trouble with moving (coordination). Very low Very low blood sugar (severe hypoglycemia) is a medical emergency. It can cause: Fainting. Seizures. Loss of consciousness (coma). Death. How is this treated? Treating low blood sugar Low blood sugar is often treated by eating or drinking something that has sugar in it right away. The food or drink should contain 15 grams of a fast-acting carb (carbohydrate). Options include: 4 oz (120 mL) of fruit juice. 4 oz (120  if you are experiencing a Mental Health or [place] or need someone to talk to.  Following is a copy of your full care plan:  Care Plan : RN Care Manager plan of care  Updates made by Kassie Mends, RN since 07/10/2021 12:00 AM     Problem: No plan of care established for management of chronic disease states (DM2, HTN, CKD stage 4)   Priority: High     Long-Range Goal: Development of plan of care for chronic disease management (DM2, HTN, CKD stage 4)   Start Date: 07/10/2021  Expected End Date: 01/06/2022  Priority: High  Note:   Current Barriers:  Knowledge Deficits related to plan of care for management of HTN and DMII - patient reports she lives alone, has hired help daily from 9 am- 5 pm, has sister in law she can call for assistance if needed, pt does not drive and needs assistance with ADL's such as bathing, cooking, medication oversight etc and her aide assists.  Patient reports she checks CBG once daily with  readings 150-160's range with occasional 200 if "I eat something I shouldn't", checks blood pressure daily and reports readings " are usually good"  Patient reports she has advanced directives with her sister Frazier Butt as HCPOA.  Patient reports home health PT and OT working with her twice weekly.  Patient needs assistance with ongoing teaching/ reinforcement of diabetes/ HTN management and diet.   RNCM Clinical Goal(s):  Patient will verbalize understanding of plan for management of HTN and DMII as evidenced by patient report, review of EHR  through collaboration with RN Care manager, provider, and care team.   Interventions: 1:1 collaboration with primary care provider regarding development and update of comprehensive plan of care as evidenced by provider attestation and co-signature Inter-disciplinary care team collaboration (see longitudinal plan of care) Evaluation of current treatment plan related to  self management and patient's adherence to plan as established by provider  Hypertension Interventions: Last practice recorded BP readings:  BP Readings from Last 3 Encounters:  07/10/21 (!) 171/84  06/30/21 134/62  06/26/21 (!) 143/65  Most recent eGFR/CrCl:  Lab Results  Component Value Date   EGFR 27 (L) 05/26/2021    No components found for: CRCL  Evaluation of current treatment plan related to hypertension self management and patient's adherence to plan as established by provider; Provided education to patient re: stroke prevention, s/s of heart attack and stroke; Reviewed medications with patient and discussed importance of compliance; Discussed plans  with patient for ongoing care management follow up and provided patient with direct contact information for care management team; Advised patient, providing education and rationale, to monitor blood pressure daily and record, calling PCP for findings outside established parameters;  Discussed complications of poorly controlled  blood pressure such as heart disease, stroke, circulatory complications, vision complications, kidney impairment, sexual dysfunction;  Assessed social determinant of health barriers;  Education sent via My Chart- low sodium diet Reviewed low sodium diet, foods to avoid/ limit  Diabetes Interventions: Assessed patient's understanding of A1c goal: <7% Provided education to patient about basic DM disease process; Reviewed medications with patient and discussed importance of medication adherence; Counseled on importance of regular laboratory monitoring as prescribed; Discussed plans with patient for ongoing care management follow up and provided patient with direct contact information for care management team; Provided patient with written educational materials related to hypo and hyperglycemia and importance of correct treatment; Review of patient status, including review of consultants reports, relevant laboratory and other test results, and medications completed; Screening for signs and symptoms of depression related to chronic disease state;  Reviewed carbohydrate modified diet Education sent via My Chart- hypoglycemia  Lab Results  Component Value Date   HGBA1C 8.3 (H) 05/24/2021    Patient Goals/Self-Care Activities: check blood sugar at prescribed times: once daily check feet daily for cuts, sores or redness enter blood sugar readings and medication or insulin into daily log take the blood sugar log to all doctor visits take the blood sugar meter to all doctor visits fill half of plate with vegetables manage portion size check blood pressure daily write blood pressure results in a log or diary keep a blood pressure log take blood pressure log to all doctor appointments take medications for blood pressure exactly as prescribed Follow low sodium diet- avoid fast food and salty snacks Look over education sent via My Chart low sodium diet and hypoglycemia Try to walk some daily    Follow Up Plan:  Telephone follow up appointment with care management team member scheduled for:  08/25/2021       Consent to CCM Services: Ms. Valls was given information about Chronic Care Management services including:  CCM service includes personalized support from designated clinical staff supervised by her physician, including individualized plan of care and coordination with other care providers 24/7 contact phone numbers for assistance for urgent and routine care needs. Service will only be billed when office clinical staff spend 20 minutes or more in a month to coordinate care. Only one practitioner may furnish and bill the service in a calendar month. The patient may stop CCM services at any time (effective at the end of the month) by phone call to the office staff. The patient will be responsible for cost sharing (co-pay) of up to 20% of the service fee (after annual deductible is met).  Patient agreed to services and verbal consent obtained.   Patient verbalizes understanding of instructions provided today and agrees to view in [place].   Telephone follow up appointment with care management team member scheduled for:  08/25/2021

## 2021-07-10 NOTE — Telephone Encounter (Signed)
Received call from [place] with [facility] to follow up on orders placed last week for PT and OT eval for patient.   Please advise at [phone].

## 2021-07-11 DIAGNOSIS — E1122 Type 2 diabetes mellitus with diabetic chronic kidney disease: Secondary | ICD-10-CM | POA: Diagnosis not present

## 2021-07-11 DIAGNOSIS — J069 Acute upper respiratory infection, unspecified: Secondary | ICD-10-CM | POA: Diagnosis not present

## 2021-07-11 DIAGNOSIS — R69 Illness, unspecified: Secondary | ICD-10-CM | POA: Diagnosis not present

## 2021-07-11 DIAGNOSIS — E11319 Type 2 diabetes mellitus with unspecified diabetic retinopathy without macular edema: Secondary | ICD-10-CM | POA: Diagnosis not present

## 2021-07-11 DIAGNOSIS — N184 Chronic kidney disease, stage 4 (severe): Secondary | ICD-10-CM | POA: Diagnosis not present

## 2021-07-11 DIAGNOSIS — B341 Enterovirus infection, unspecified: Secondary | ICD-10-CM | POA: Diagnosis not present

## 2021-07-11 DIAGNOSIS — E785 Hyperlipidemia, unspecified: Secondary | ICD-10-CM | POA: Diagnosis not present

## 2021-07-11 DIAGNOSIS — J9601 Acute respiratory failure with hypoxia: Secondary | ICD-10-CM | POA: Diagnosis not present

## 2021-07-11 DIAGNOSIS — I69354 Hemiplegia and hemiparesis following cerebral infarction affecting left non-dominant side: Secondary | ICD-10-CM | POA: Diagnosis not present

## 2021-07-11 DIAGNOSIS — I129 Hypertensive chronic kidney disease with stage 1 through stage 4 chronic kidney disease, or unspecified chronic kidney disease: Secondary | ICD-10-CM | POA: Diagnosis not present

## 2021-07-17 DIAGNOSIS — I129 Hypertensive chronic kidney disease with stage 1 through stage 4 chronic kidney disease, or unspecified chronic kidney disease: Secondary | ICD-10-CM | POA: Diagnosis not present

## 2021-07-17 DIAGNOSIS — J9601 Acute respiratory failure with hypoxia: Secondary | ICD-10-CM | POA: Diagnosis not present

## 2021-07-17 DIAGNOSIS — N184 Chronic kidney disease, stage 4 (severe): Secondary | ICD-10-CM | POA: Diagnosis not present

## 2021-07-17 DIAGNOSIS — E785 Hyperlipidemia, unspecified: Secondary | ICD-10-CM | POA: Diagnosis not present

## 2021-07-17 DIAGNOSIS — E11319 Type 2 diabetes mellitus with unspecified diabetic retinopathy without macular edema: Secondary | ICD-10-CM | POA: Diagnosis not present

## 2021-07-17 DIAGNOSIS — J069 Acute upper respiratory infection, unspecified: Secondary | ICD-10-CM | POA: Diagnosis not present

## 2021-07-17 DIAGNOSIS — B341 Enterovirus infection, unspecified: Secondary | ICD-10-CM | POA: Diagnosis not present

## 2021-07-17 DIAGNOSIS — I69354 Hemiplegia and hemiparesis following cerebral infarction affecting left non-dominant side: Secondary | ICD-10-CM | POA: Diagnosis not present

## 2021-07-17 DIAGNOSIS — R69 Illness, unspecified: Secondary | ICD-10-CM | POA: Diagnosis not present

## 2021-07-17 DIAGNOSIS — E1122 Type 2 diabetes mellitus with diabetic chronic kidney disease: Secondary | ICD-10-CM | POA: Diagnosis not present

## 2021-07-18 ENCOUNTER — Other Ambulatory Visit: Payer: Self-pay | Admitting: Nurse Practitioner

## 2021-07-18 DIAGNOSIS — B341 Enterovirus infection, unspecified: Secondary | ICD-10-CM | POA: Diagnosis not present

## 2021-07-18 DIAGNOSIS — J9601 Acute respiratory failure with hypoxia: Secondary | ICD-10-CM | POA: Diagnosis not present

## 2021-07-18 DIAGNOSIS — J069 Acute upper respiratory infection, unspecified: Secondary | ICD-10-CM | POA: Diagnosis not present

## 2021-07-18 DIAGNOSIS — E1122 Type 2 diabetes mellitus with diabetic chronic kidney disease: Secondary | ICD-10-CM | POA: Diagnosis not present

## 2021-07-18 DIAGNOSIS — E785 Hyperlipidemia, unspecified: Secondary | ICD-10-CM | POA: Diagnosis not present

## 2021-07-18 DIAGNOSIS — J4531 Mild persistent asthma with (acute) exacerbation: Secondary | ICD-10-CM

## 2021-07-18 DIAGNOSIS — I69354 Hemiplegia and hemiparesis following cerebral infarction affecting left non-dominant side: Secondary | ICD-10-CM | POA: Diagnosis not present

## 2021-07-18 DIAGNOSIS — R69 Illness, unspecified: Secondary | ICD-10-CM | POA: Diagnosis not present

## 2021-07-18 DIAGNOSIS — I129 Hypertensive chronic kidney disease with stage 1 through stage 4 chronic kidney disease, or unspecified chronic kidney disease: Secondary | ICD-10-CM | POA: Diagnosis not present

## 2021-07-18 DIAGNOSIS — N184 Chronic kidney disease, stage 4 (severe): Secondary | ICD-10-CM | POA: Diagnosis not present

## 2021-07-18 DIAGNOSIS — E11319 Type 2 diabetes mellitus with unspecified diabetic retinopathy without macular edema: Secondary | ICD-10-CM | POA: Diagnosis not present

## 2021-07-19 DIAGNOSIS — E1165 Type 2 diabetes mellitus with hyperglycemia: Secondary | ICD-10-CM | POA: Diagnosis not present

## 2021-07-19 DIAGNOSIS — I10 Essential (primary) hypertension: Secondary | ICD-10-CM | POA: Diagnosis not present

## 2021-07-21 DIAGNOSIS — J9601 Acute respiratory failure with hypoxia: Secondary | ICD-10-CM | POA: Diagnosis not present

## 2021-07-21 DIAGNOSIS — N184 Chronic kidney disease, stage 4 (severe): Secondary | ICD-10-CM

## 2021-07-21 DIAGNOSIS — B341 Enterovirus infection, unspecified: Secondary | ICD-10-CM | POA: Diagnosis not present

## 2021-07-21 DIAGNOSIS — I69354 Hemiplegia and hemiparesis following cerebral infarction affecting left non-dominant side: Secondary | ICD-10-CM | POA: Diagnosis not present

## 2021-07-21 DIAGNOSIS — E1122 Type 2 diabetes mellitus with diabetic chronic kidney disease: Secondary | ICD-10-CM | POA: Diagnosis not present

## 2021-07-21 DIAGNOSIS — J069 Acute upper respiratory infection, unspecified: Secondary | ICD-10-CM | POA: Diagnosis not present

## 2021-07-21 DIAGNOSIS — I129 Hypertensive chronic kidney disease with stage 1 through stage 4 chronic kidney disease, or unspecified chronic kidney disease: Secondary | ICD-10-CM | POA: Diagnosis not present

## 2021-07-27 DIAGNOSIS — J069 Acute upper respiratory infection, unspecified: Secondary | ICD-10-CM | POA: Diagnosis not present

## 2021-07-27 DIAGNOSIS — I129 Hypertensive chronic kidney disease with stage 1 through stage 4 chronic kidney disease, or unspecified chronic kidney disease: Secondary | ICD-10-CM | POA: Diagnosis not present

## 2021-07-27 DIAGNOSIS — E11319 Type 2 diabetes mellitus with unspecified diabetic retinopathy without macular edema: Secondary | ICD-10-CM | POA: Diagnosis not present

## 2021-07-27 DIAGNOSIS — B341 Enterovirus infection, unspecified: Secondary | ICD-10-CM | POA: Diagnosis not present

## 2021-07-27 DIAGNOSIS — N184 Chronic kidney disease, stage 4 (severe): Secondary | ICD-10-CM | POA: Diagnosis not present

## 2021-07-27 DIAGNOSIS — J9601 Acute respiratory failure with hypoxia: Secondary | ICD-10-CM | POA: Diagnosis not present

## 2021-07-27 DIAGNOSIS — R69 Illness, unspecified: Secondary | ICD-10-CM | POA: Diagnosis not present

## 2021-07-27 DIAGNOSIS — I69354 Hemiplegia and hemiparesis following cerebral infarction affecting left non-dominant side: Secondary | ICD-10-CM | POA: Diagnosis not present

## 2021-07-27 DIAGNOSIS — E785 Hyperlipidemia, unspecified: Secondary | ICD-10-CM | POA: Diagnosis not present

## 2021-07-27 DIAGNOSIS — R062 Wheezing: Secondary | ICD-10-CM | POA: Diagnosis not present

## 2021-07-27 DIAGNOSIS — E1122 Type 2 diabetes mellitus with diabetic chronic kidney disease: Secondary | ICD-10-CM | POA: Diagnosis not present

## 2021-07-28 DIAGNOSIS — B341 Enterovirus infection, unspecified: Secondary | ICD-10-CM | POA: Diagnosis not present

## 2021-07-28 DIAGNOSIS — E1122 Type 2 diabetes mellitus with diabetic chronic kidney disease: Secondary | ICD-10-CM | POA: Diagnosis not present

## 2021-07-28 DIAGNOSIS — E11319 Type 2 diabetes mellitus with unspecified diabetic retinopathy without macular edema: Secondary | ICD-10-CM | POA: Diagnosis not present

## 2021-07-28 DIAGNOSIS — J9601 Acute respiratory failure with hypoxia: Secondary | ICD-10-CM | POA: Diagnosis not present

## 2021-07-28 DIAGNOSIS — J069 Acute upper respiratory infection, unspecified: Secondary | ICD-10-CM | POA: Diagnosis not present

## 2021-07-28 DIAGNOSIS — R69 Illness, unspecified: Secondary | ICD-10-CM | POA: Diagnosis not present

## 2021-07-28 DIAGNOSIS — I69354 Hemiplegia and hemiparesis following cerebral infarction affecting left non-dominant side: Secondary | ICD-10-CM | POA: Diagnosis not present

## 2021-07-28 DIAGNOSIS — I129 Hypertensive chronic kidney disease with stage 1 through stage 4 chronic kidney disease, or unspecified chronic kidney disease: Secondary | ICD-10-CM | POA: Diagnosis not present

## 2021-07-28 DIAGNOSIS — E785 Hyperlipidemia, unspecified: Secondary | ICD-10-CM | POA: Diagnosis not present

## 2021-07-28 DIAGNOSIS — N184 Chronic kidney disease, stage 4 (severe): Secondary | ICD-10-CM | POA: Diagnosis not present

## 2021-07-31 DIAGNOSIS — R69 Illness, unspecified: Secondary | ICD-10-CM | POA: Diagnosis not present

## 2021-07-31 DIAGNOSIS — E785 Hyperlipidemia, unspecified: Secondary | ICD-10-CM | POA: Diagnosis not present

## 2021-07-31 DIAGNOSIS — J9601 Acute respiratory failure with hypoxia: Secondary | ICD-10-CM | POA: Diagnosis not present

## 2021-07-31 DIAGNOSIS — E1122 Type 2 diabetes mellitus with diabetic chronic kidney disease: Secondary | ICD-10-CM | POA: Diagnosis not present

## 2021-07-31 DIAGNOSIS — N184 Chronic kidney disease, stage 4 (severe): Secondary | ICD-10-CM | POA: Diagnosis not present

## 2021-07-31 DIAGNOSIS — I69354 Hemiplegia and hemiparesis following cerebral infarction affecting left non-dominant side: Secondary | ICD-10-CM | POA: Diagnosis not present

## 2021-07-31 DIAGNOSIS — I129 Hypertensive chronic kidney disease with stage 1 through stage 4 chronic kidney disease, or unspecified chronic kidney disease: Secondary | ICD-10-CM | POA: Diagnosis not present

## 2021-07-31 DIAGNOSIS — E11319 Type 2 diabetes mellitus with unspecified diabetic retinopathy without macular edema: Secondary | ICD-10-CM | POA: Diagnosis not present

## 2021-07-31 DIAGNOSIS — J069 Acute upper respiratory infection, unspecified: Secondary | ICD-10-CM | POA: Diagnosis not present

## 2021-07-31 DIAGNOSIS — B341 Enterovirus infection, unspecified: Secondary | ICD-10-CM | POA: Diagnosis not present

## 2021-08-01 ENCOUNTER — Other Ambulatory Visit: Payer: Self-pay | Admitting: Family Medicine

## 2021-08-08 DIAGNOSIS — N184 Chronic kidney disease, stage 4 (severe): Secondary | ICD-10-CM | POA: Diagnosis not present

## 2021-08-08 DIAGNOSIS — I129 Hypertensive chronic kidney disease with stage 1 through stage 4 chronic kidney disease, or unspecified chronic kidney disease: Secondary | ICD-10-CM | POA: Diagnosis not present

## 2021-08-08 DIAGNOSIS — I69354 Hemiplegia and hemiparesis following cerebral infarction affecting left non-dominant side: Secondary | ICD-10-CM | POA: Diagnosis not present

## 2021-08-08 DIAGNOSIS — B341 Enterovirus infection, unspecified: Secondary | ICD-10-CM | POA: Diagnosis not present

## 2021-08-08 DIAGNOSIS — J9601 Acute respiratory failure with hypoxia: Secondary | ICD-10-CM | POA: Diagnosis not present

## 2021-08-08 DIAGNOSIS — E1122 Type 2 diabetes mellitus with diabetic chronic kidney disease: Secondary | ICD-10-CM | POA: Diagnosis not present

## 2021-08-08 DIAGNOSIS — R69 Illness, unspecified: Secondary | ICD-10-CM | POA: Diagnosis not present

## 2021-08-08 DIAGNOSIS — E785 Hyperlipidemia, unspecified: Secondary | ICD-10-CM | POA: Diagnosis not present

## 2021-08-08 DIAGNOSIS — E11319 Type 2 diabetes mellitus with unspecified diabetic retinopathy without macular edema: Secondary | ICD-10-CM | POA: Diagnosis not present

## 2021-08-08 DIAGNOSIS — J069 Acute upper respiratory infection, unspecified: Secondary | ICD-10-CM | POA: Diagnosis not present

## 2021-08-09 DIAGNOSIS — I69354 Hemiplegia and hemiparesis following cerebral infarction affecting left non-dominant side: Secondary | ICD-10-CM | POA: Diagnosis not present

## 2021-08-09 DIAGNOSIS — B341 Enterovirus infection, unspecified: Secondary | ICD-10-CM | POA: Diagnosis not present

## 2021-08-09 DIAGNOSIS — R69 Illness, unspecified: Secondary | ICD-10-CM | POA: Diagnosis not present

## 2021-08-09 DIAGNOSIS — J9601 Acute respiratory failure with hypoxia: Secondary | ICD-10-CM | POA: Diagnosis not present

## 2021-08-09 DIAGNOSIS — J069 Acute upper respiratory infection, unspecified: Secondary | ICD-10-CM | POA: Diagnosis not present

## 2021-08-09 DIAGNOSIS — N184 Chronic kidney disease, stage 4 (severe): Secondary | ICD-10-CM | POA: Diagnosis not present

## 2021-08-09 DIAGNOSIS — E785 Hyperlipidemia, unspecified: Secondary | ICD-10-CM | POA: Diagnosis not present

## 2021-08-09 DIAGNOSIS — I129 Hypertensive chronic kidney disease with stage 1 through stage 4 chronic kidney disease, or unspecified chronic kidney disease: Secondary | ICD-10-CM | POA: Diagnosis not present

## 2021-08-09 DIAGNOSIS — E1122 Type 2 diabetes mellitus with diabetic chronic kidney disease: Secondary | ICD-10-CM | POA: Diagnosis not present

## 2021-08-09 DIAGNOSIS — E11319 Type 2 diabetes mellitus with unspecified diabetic retinopathy without macular edema: Secondary | ICD-10-CM | POA: Diagnosis not present

## 2021-08-14 DIAGNOSIS — I129 Hypertensive chronic kidney disease with stage 1 through stage 4 chronic kidney disease, or unspecified chronic kidney disease: Secondary | ICD-10-CM | POA: Diagnosis not present

## 2021-08-14 DIAGNOSIS — B341 Enterovirus infection, unspecified: Secondary | ICD-10-CM | POA: Diagnosis not present

## 2021-08-14 DIAGNOSIS — E1122 Type 2 diabetes mellitus with diabetic chronic kidney disease: Secondary | ICD-10-CM | POA: Diagnosis not present

## 2021-08-14 DIAGNOSIS — E11319 Type 2 diabetes mellitus with unspecified diabetic retinopathy without macular edema: Secondary | ICD-10-CM | POA: Diagnosis not present

## 2021-08-14 DIAGNOSIS — E785 Hyperlipidemia, unspecified: Secondary | ICD-10-CM | POA: Diagnosis not present

## 2021-08-14 DIAGNOSIS — R69 Illness, unspecified: Secondary | ICD-10-CM | POA: Diagnosis not present

## 2021-08-14 DIAGNOSIS — J9601 Acute respiratory failure with hypoxia: Secondary | ICD-10-CM | POA: Diagnosis not present

## 2021-08-14 DIAGNOSIS — N184 Chronic kidney disease, stage 4 (severe): Secondary | ICD-10-CM | POA: Diagnosis not present

## 2021-08-14 DIAGNOSIS — I69354 Hemiplegia and hemiparesis following cerebral infarction affecting left non-dominant side: Secondary | ICD-10-CM | POA: Diagnosis not present

## 2021-08-14 DIAGNOSIS — J069 Acute upper respiratory infection, unspecified: Secondary | ICD-10-CM | POA: Diagnosis not present

## 2021-08-18 ENCOUNTER — Other Ambulatory Visit: Payer: Self-pay | Admitting: Family Medicine

## 2021-08-18 ENCOUNTER — Other Ambulatory Visit: Payer: Self-pay

## 2021-08-18 MED ORDER — ALPRAZOLAM 0.5 MG PO TABS: 0.5000 mg | ORAL_TABLET | Freq: Two times a day (BID) | ORAL | 2 refills | Status: DC

## 2021-08-18 NOTE — Addendum Note (Signed)
Addended by: Jaynie Crumble on: 08/18/2021 04:49 PM   Modules accepted: Orders

## 2021-08-22 DIAGNOSIS — J069 Acute upper respiratory infection, unspecified: Secondary | ICD-10-CM | POA: Diagnosis not present

## 2021-08-22 DIAGNOSIS — J9601 Acute respiratory failure with hypoxia: Secondary | ICD-10-CM | POA: Diagnosis not present

## 2021-08-22 DIAGNOSIS — N184 Chronic kidney disease, stage 4 (severe): Secondary | ICD-10-CM | POA: Diagnosis not present

## 2021-08-22 DIAGNOSIS — E11319 Type 2 diabetes mellitus with unspecified diabetic retinopathy without macular edema: Secondary | ICD-10-CM | POA: Diagnosis not present

## 2021-08-22 DIAGNOSIS — E785 Hyperlipidemia, unspecified: Secondary | ICD-10-CM | POA: Diagnosis not present

## 2021-08-22 DIAGNOSIS — R69 Illness, unspecified: Secondary | ICD-10-CM | POA: Diagnosis not present

## 2021-08-22 DIAGNOSIS — B341 Enterovirus infection, unspecified: Secondary | ICD-10-CM | POA: Diagnosis not present

## 2021-08-22 DIAGNOSIS — E1122 Type 2 diabetes mellitus with diabetic chronic kidney disease: Secondary | ICD-10-CM | POA: Diagnosis not present

## 2021-08-22 DIAGNOSIS — I129 Hypertensive chronic kidney disease with stage 1 through stage 4 chronic kidney disease, or unspecified chronic kidney disease: Secondary | ICD-10-CM | POA: Diagnosis not present

## 2021-08-22 DIAGNOSIS — I69354 Hemiplegia and hemiparesis following cerebral infarction affecting left non-dominant side: Secondary | ICD-10-CM | POA: Diagnosis not present

## 2021-08-25 ENCOUNTER — Ambulatory Visit (INDEPENDENT_AMBULATORY_CARE_PROVIDER_SITE_OTHER): Payer: Medicare HMO | Admitting: *Deleted

## 2021-08-25 DIAGNOSIS — J069 Acute upper respiratory infection, unspecified: Secondary | ICD-10-CM | POA: Diagnosis not present

## 2021-08-25 DIAGNOSIS — I129 Hypertensive chronic kidney disease with stage 1 through stage 4 chronic kidney disease, or unspecified chronic kidney disease: Secondary | ICD-10-CM | POA: Diagnosis not present

## 2021-08-25 DIAGNOSIS — E785 Hyperlipidemia, unspecified: Secondary | ICD-10-CM | POA: Diagnosis not present

## 2021-08-25 DIAGNOSIS — B341 Enterovirus infection, unspecified: Secondary | ICD-10-CM | POA: Diagnosis not present

## 2021-08-25 DIAGNOSIS — N184 Chronic kidney disease, stage 4 (severe): Secondary | ICD-10-CM | POA: Diagnosis not present

## 2021-08-25 DIAGNOSIS — I69354 Hemiplegia and hemiparesis following cerebral infarction affecting left non-dominant side: Secondary | ICD-10-CM | POA: Diagnosis not present

## 2021-08-25 DIAGNOSIS — R69 Illness, unspecified: Secondary | ICD-10-CM | POA: Diagnosis not present

## 2021-08-25 DIAGNOSIS — J9601 Acute respiratory failure with hypoxia: Secondary | ICD-10-CM | POA: Diagnosis not present

## 2021-08-25 DIAGNOSIS — E1122 Type 2 diabetes mellitus with diabetic chronic kidney disease: Secondary | ICD-10-CM | POA: Diagnosis not present

## 2021-08-25 DIAGNOSIS — I10 Essential (primary) hypertension: Secondary | ICD-10-CM

## 2021-08-25 DIAGNOSIS — E11319 Type 2 diabetes mellitus with unspecified diabetic retinopathy without macular edema: Secondary | ICD-10-CM

## 2021-08-25 NOTE — Chronic Care Management (AMB) (Signed)
Chronic Care Management   CCM RN Visit Note  08/25/2021 Name: Renee [name] MRN: [id number] DOB: [birth year]  Subjective: Sudie Grumbling Haak is a 88 y.o. year old female who is a primary care patient of Pickard, Cammie Mcgee, MD. The care management team was consulted for assistance with disease management and care coordination needs.    Engaged with patient by telephone for follow up visit in response to provider referral for case management and/or care coordination services.   Consent to Services:  The patient was given information about Chronic Care Management services, agreed to services, and gave verbal consent prior to initiation of services.  Please see initial visit note for detailed documentation.   Patient agreed to services and verbal consent obtained.   Assessment: Review of patient past medical history, allergies, medications, health status, including review of consultants reports, laboratory and other test data, was performed as part of comprehensive evaluation and provision of chronic care management services.   SDOH (Social Determinants of Health) assessments and interventions performed:    CCM Care Plan  Allergies  Allergen Reactions   Tape Other (See Comments)    The patient's skin is VERY THIN- will TEAR EASILY!!   Diclofenac Sodium Swelling and Other (See Comments)    Angioedema   Pioglitazone Swelling and Other (See Comments)    Re-started in 2022    Outpatient Encounter Medications as of 08/25/2021  Medication Sig   acetaminophen (TYLENOL) 500 MG tablet Take 1,000 mg by mouth at bedtime.   albuterol (PROVENTIL) (2.5 MG/3ML) 0.083% nebulizer solution INHALE 1 VIAL CONTENTS INTO THE LUNGS BY NEBULIZER EVERY 2HRS AS NEEDED FOR SHORTNESS OF BREATH   albuterol (VENTOLIN HFA) 108 (90 Base) MCG/ACT inhaler INHALE 2 PUFFS INTO THE LUNGS EVERY 4 HOURS AS NEEDED FOR WHEEZING/SHORTNESS OF BREATH (Patient taking differently: Inhale 2 puffs into the lungs every 4 (four) hours as  needed for shortness of breath or wheezing.)   ALPRAZolam (XANAX) 0.5 MG tablet Take 1 tablet (0.5 mg total) by mouth 2 (two) times daily. TAKE 1/2 TO 1 TABLET BY MOUTH TWICE A DAY AS NEEDED FOR ANXIETY Strength: 0.5 mg   amLODipine (NORVASC) 10 MG tablet Take 1 tablet (10 mg total) by mouth daily.   aspirin EC 325 MG tablet Take 325 mg by mouth in the morning.   Blood Glucose Monitoring Suppl (ONE TOUCH ULTRA 2) w/Device KIT Check blood sugar once daily and as directed. Dx E11.9   calcium-vitamin D (OSCAL WITH D) 500-200 MG-UNIT per tablet Take 1 tablet by mouth daily.   Cholecalciferol (VITAMIN D-3) 25 MCG (1000 UT) CAPS Take 1,000 Units by mouth in the morning.   Cranberry 500 MG TABS Take 500 mg by mouth in the morning and at bedtime.   docusate sodium (COLACE) 100 MG capsule Take 200 mg by mouth at bedtime.   estradiol (ESTRACE) 0.1 MG/GM vaginal cream PLEASE SEE ATTACHED FOR DETAILED DIRECTIONS   FLONASE 50 MCG/ACT nasal spray Place 2 sprays into both nostrils See admin instructions. Instill 2 sprays into each nostril at bedtime when not using Nasacort   glipiZIDE (GLIPIZIDE XL) 10 MG 24 hr tablet Take 1 tablet (10 mg total) by mouth daily with breakfast.   guaiFENesin (MUCINEX) 600 MG 12 hr tablet Take 2 tablets (1,200 mg total) by mouth 2 (two) times daily.   hydrALAZINE (APRESOLINE) 100 MG tablet Take 1 tablet (100 mg total) by mouth 3 (three) times daily.   Lancets (ONETOUCH DELICA PLUS [id number]) MISC CHECK BLOOD  SUGAR ONCE DAILY AND AS DIRECTED   metoprolol succinate (TOPROL-XL) 25 MG 24 hr tablet Take 0.5 tablets (12.5 mg total) by mouth 2 (two) times daily. (Patient taking differently: Take 12.5 mg by mouth in the morning and at bedtime.)   mirtazapine (REMERON) 15 MG tablet Take 1 tablet (15 mg total) by mouth at bedtime.   Multiple Vitamin (MULTIVITAMIN) capsule Take 1 capsule by mouth daily.   Multiple Vitamins-Minerals (PRESERVISION AREDS 2+MULTI VIT) CAPS Take 1 capsule by mouth  in the morning and at bedtime.   Olopatadine HCl (PATADAY OP) Place 1 drop into both eyes 2 (two) times daily as needed (for itching or redness).   ONETOUCH ULTRA test strip CHECK BLOOD SUGAR ONCE DAILY AND AS DIRECTED. DX E11.9   pioglitazone (ACTOS) 15 MG tablet Take 1 tablet (15 mg total) by mouth daily.   sertraline (ZOLOFT) 50 MG tablet TAKE 1 TABLET BY MOUTH EVERY DAY   sitaGLIPtin (JANUVIA) 100 MG tablet Take 1 tablet (100 mg total) by mouth daily.   trandolapril (MAVIK) 4 MG tablet Take 1 tablet (4 mg total) by mouth daily.   triamcinolone (NASACORT) 55 MCG/ACT AERO nasal inhaler Place 2 sprays into the nose daily. (Patient taking differently: Place 2 sprays into the nose See admin instructions. Instill 2 sprays into each nostril at bedtime when not using Flonase)   vitamin B-12 (CYANOCOBALAMIN) 1000 MCG tablet Take 1,000 mcg by mouth daily.    predniSONE (DELTASONE) 10 MG tablet Take 3 tablets (30 mg) daily for 2 days, then, Take 2 tablets (20 mg) daily for 2 days, then, Take 1 tablets (10 mg) daily for 1 days, then stop. (Patient not taking: Reported on 07/10/2021)   No facility-administered encounter medications on file as of 08/25/2021.    Patient Active Problem List   Diagnosis Date Noted   Acute respiratory failure with hypoxia ([place]) 06/23/2021   Pneumothorax 08/09/2020   Pleural effusion 05/13/2020   Frequent UTI 05/13/2020   Dizziness 02/12/2020   Normocytic anemia 02/12/2020   Fall involving sidewalk curb 11/29/2019   Contusion of back 11/27/2019   Hyperlipidemia associated with type 2 diabetes mellitus ([place])    Aortic atherosclerosis ([place]) 07/13/2019   H/O sepsis 07/01/2019   CKD (chronic kidney disease) stage 4, GFR 15-29 ml/min ([place]) 07/01/2019   Lower abdominal pain 05/11/2019   Routine general medical examination at a health care facility 03/30/2019   External hemorrhoid 01/17/2018   History of colitis 01/17/2018   Generalized weakness 12/24/2016   Diabetic  retinopathy ([place]) 12/11/2016   History of CVA (cerebrovascular accident) 12/11/2016   Community acquired pneumonia of left lower lobe of lung 10/07/2015   Estrogen deficiency 08/30/2015   Encounter for Medicare annual wellness exam 04/17/2013   Chest wall pain 04/07/2013   Mobility impaired 06/26/2011   History of retinal detachment 01/10/2011   Sleep apnea 11/28/2010   Anxiety and depression 08/25/2010   Hemiplegia, late effect of cerebrovascular disease ([place]) 07/05/2010   POSTHERPETIC NEURALGIA 11/09/2009   Renal insufficiency 06/29/2008   Chronic back pain 01/26/2008   EDEMA 01/26/2008   B12 deficiency 01/10/2007   Type 2 diabetes, controlled, with retinopathy ([place]) 11/27/2006   Essential hypertension 11/27/2006   FIBROCYSTIC BREAST DISEASE 11/27/2006   ROSACEA 11/27/2006   OSTEOARTHRITIS 11/27/2006   URINARY INCONTINENCE, MIXED 11/27/2006    Conditions to be addressed/monitored:HTN and DMII  Care Plan : RN Care Manager plan of care  Updates made by Kassie Mends, RN since 08/25/2021 12:00 AM  Problem: No plan of care established for management of chronic disease states (DM2, HTN, CKD stage 4)   Priority: High     Long-Range Goal: Development of plan of care for chronic disease management (DM2, HTN, CKD stage 4)   Start Date: 07/10/2021  Expected End Date: 01/06/2022  Priority: High  Note:   Current Barriers:  Knowledge Deficits related to plan of care for management of HTN and DMII - patient reports she lives alone, has hired help daily from 9 am- 5 pm, has sister in law she can call for assistance if needed, pt does not drive and needs assistance with ADL's such as bathing, cooking, medication oversight etc and her aide assists.  Patient reports she checks CBG once daily with readings 120-160's range with occasional 200 if "I eat something I shouldn't", CBG today 125, checks blood pressure daily and reports readings " are usually good"  Patient reports she has advanced  directives with her sister Frazier Butt as HCPOA.  Patient reports home health PT and OT continue working with her.  Patient needs assistance with ongoing teaching/ reinforcement of diabetes/ HTN management and diet.   RNCM Clinical Goal(s):  Patient will verbalize understanding of plan for management of HTN and DMII as evidenced by patient report, review of EHR  through collaboration with RN Care manager, provider, and care team.   Interventions: 1:1 collaboration with primary care provider regarding development and update of comprehensive plan of care as evidenced by provider attestation and co-signature Inter-disciplinary care team collaboration (see longitudinal plan of care) Evaluation of current treatment plan related to  self management and patient's adherence to plan as established by provider  Hypertension Interventions: Last practice recorded BP readings:  BP Readings from Last 3 Encounters:  07/10/21 (!) 171/84  06/30/21 134/62  06/26/21 (!) 143/65  Most recent eGFR/CrCl:  Lab Results  Component Value Date   EGFR 27 (L) 05/26/2021    No components found for: CRCL  Evaluation of current treatment plan related to hypertension self management and patient's adherence to plan as established by provider Reviewed medications with patient and discussed importance of compliance Advised patient, providing education and rationale, to monitor blood pressure daily and record, calling PCP for findings outside established parameters Discussed complications of poorly controlled blood pressure such as heart disease, stroke, circulatory complications, vision complications, kidney impairment, sexual dysfunction Reinforced low sodium diet, foods to avoid/ limit- salty snacks and fast food Reviewed importance of working closely with home health PT and OT and completing exercises as prescribed  Diabetes Interventions: Assessed patient's understanding of A1c goal: <7% Reviewed medications with  patient and discussed importance of medication adherence Review of patient status, including review of consultants reports, relevant laboratory and other test results, and medications completed Reviewed carbohydrate modified diet Reviewed importance of limiting/ avoiding concentrated sweets Pain assessment completed Talked with caregiver (permission given by pt) and reviewed CBG, BP log Lab Results  Component Value Date   HGBA1C 8.3 (H) 05/24/2021    Patient Goals/Self-Care Activities: check blood sugar at prescribed times: once daily trim toenails straight across fill half of plate with vegetables manage portion size prepare main meal at home 3 to 5 days each week keep feet up while sitting check blood pressure daily write blood pressure results in a log or diary keep a blood pressure log take blood pressure log to all doctor appointments take medications for blood pressure exactly as prescribed report new symptoms to your doctor Follow low sodium diet- avoid  fast food and salty snacks Try to complete exercises prescribed by PT and OT  Follow Up Plan:  Telephone follow up appointment with care management team member scheduled for:  10/13/2021      Plan:Telephone follow up appointment with care management team member scheduled for:  10/13/2021  Jacqlyn Larsen [facility], BSN RN Case Manager [place] [phone]

## 2021-08-25 NOTE — Patient Instructions (Signed)
Visit Information  Thank you for taking time to visit with me today. Please don't hesitate to contact me if I can be of assistance to you before our next scheduled telephone appointment.  Following are the goals we discussed today:  check blood sugar at prescribed times: once daily trim toenails straight across fill half of plate with vegetables manage portion size prepare main meal at home 3 to 5 days each week keep feet up while sitting check blood pressure daily write blood pressure results in a log or diary keep a blood pressure log take blood pressure log to all doctor appointments take medications for blood pressure exactly as prescribed report new symptoms to your doctor Follow low sodium diet- avoid fast food and salty snacks Try to complete exercises prescribed by PT and OT  Our next appointment is by telephone on 10/13/2021 at 1045 am  Please call the care guide team at [phone] if you need to cancel or reschedule your appointment.   If you are experiencing a Mental Health or [place] or need someone to talk to, please call the Suicide and Crisis Lifeline: 988 call the [place] National Suicide Prevention Lifeline: 208 760 1835 or TTY: (807) 262-8458 TTY 905-675-4499) to talk to a trained counselor call 1-800-273-TALK (toll free, 24 hour hotline) go to [facility] [address], [place] [phone]) call 911   Patient verbalizes understanding of instructions provided today and agrees to view in [place].   Jacqlyn Larsen RNC, BSN RN Case Manager [place] [place] [facility] [phone]

## 2021-08-27 DIAGNOSIS — R062 Wheezing: Secondary | ICD-10-CM | POA: Diagnosis not present

## 2021-08-29 ENCOUNTER — Encounter (HOSPITAL_COMMUNITY): Payer: Self-pay

## 2021-08-29 ENCOUNTER — Telehealth: Payer: Self-pay

## 2021-08-29 ENCOUNTER — Other Ambulatory Visit: Payer: Self-pay | Admitting: Family Medicine

## 2021-08-29 ENCOUNTER — Emergency Department (HOSPITAL_COMMUNITY): Payer: Medicare HMO

## 2021-08-29 ENCOUNTER — Inpatient Hospital Stay (HOSPITAL_COMMUNITY)
Admission: EM | Admit: 2021-08-29 | Discharge: 2021-09-02 | DRG: 690 | Disposition: A | Discharge status: Skilled Nursing Facility | Payer: Medicare HMO | Attending: Internal Medicine | Admitting: Internal Medicine

## 2021-08-29 DIAGNOSIS — G4730 Sleep apnea, unspecified: Secondary | ICD-10-CM | POA: Diagnosis present

## 2021-08-29 DIAGNOSIS — I69354 Hemiplegia and hemiparesis following cerebral infarction affecting left non-dominant side: Secondary | ICD-10-CM

## 2021-08-29 DIAGNOSIS — Z91048 Other nonmedicinal substance allergy status: Secondary | ICD-10-CM

## 2021-08-29 DIAGNOSIS — E669 Obesity, unspecified: Secondary | ICD-10-CM | POA: Diagnosis present

## 2021-08-29 DIAGNOSIS — N3000 Acute cystitis without hematuria: Secondary | ICD-10-CM

## 2021-08-29 DIAGNOSIS — G9340 Encephalopathy, unspecified: Secondary | ICD-10-CM | POA: Diagnosis present

## 2021-08-29 DIAGNOSIS — R062 Wheezing: Secondary | ICD-10-CM | POA: Diagnosis present

## 2021-08-29 DIAGNOSIS — Z7982 Long term (current) use of aspirin: Secondary | ICD-10-CM

## 2021-08-29 DIAGNOSIS — F419 Anxiety disorder, unspecified: Secondary | ICD-10-CM | POA: Diagnosis present

## 2021-08-29 DIAGNOSIS — Z90710 Acquired absence of both cervix and uterus: Secondary | ICD-10-CM

## 2021-08-29 DIAGNOSIS — Z8249 Family history of ischemic heart disease and other diseases of the circulatory system: Secondary | ICD-10-CM

## 2021-08-29 DIAGNOSIS — J811 Chronic pulmonary edema: Secondary | ICD-10-CM | POA: Diagnosis not present

## 2021-08-29 DIAGNOSIS — Z79890 Hormone replacement therapy (postmenopausal): Secondary | ICD-10-CM

## 2021-08-29 DIAGNOSIS — J45909 Unspecified asthma, uncomplicated: Secondary | ICD-10-CM | POA: Diagnosis present

## 2021-08-29 DIAGNOSIS — Z8701 Personal history of pneumonia (recurrent): Secondary | ICD-10-CM

## 2021-08-29 DIAGNOSIS — I69959 Hemiplegia and hemiparesis following unspecified cerebrovascular disease affecting unspecified side: Secondary | ICD-10-CM

## 2021-08-29 DIAGNOSIS — N390 Urinary tract infection, site not specified: Principal | ICD-10-CM | POA: Diagnosis present

## 2021-08-29 DIAGNOSIS — G4733 Obstructive sleep apnea (adult) (pediatric): Secondary | ICD-10-CM | POA: Diagnosis present

## 2021-08-29 DIAGNOSIS — Z6832 Body mass index (BMI) 32.0-32.9, adult: Secondary | ICD-10-CM

## 2021-08-29 DIAGNOSIS — R531 Weakness: Secondary | ICD-10-CM

## 2021-08-29 DIAGNOSIS — R739 Hyperglycemia, unspecified: Secondary | ICD-10-CM | POA: Diagnosis not present

## 2021-08-29 DIAGNOSIS — E1122 Type 2 diabetes mellitus with diabetic chronic kidney disease: Secondary | ICD-10-CM | POA: Diagnosis present

## 2021-08-29 DIAGNOSIS — M8580 Other specified disorders of bone density and structure, unspecified site: Secondary | ICD-10-CM | POA: Diagnosis present

## 2021-08-29 DIAGNOSIS — Z79899 Other long term (current) drug therapy: Secondary | ICD-10-CM

## 2021-08-29 DIAGNOSIS — Z888 Allergy status to other drugs, medicaments and biological substances status: Secondary | ICD-10-CM

## 2021-08-29 DIAGNOSIS — R001 Bradycardia, unspecified: Secondary | ICD-10-CM | POA: Diagnosis present

## 2021-08-29 DIAGNOSIS — Z7984 Long term (current) use of oral hypoglycemic drugs: Secondary | ICD-10-CM

## 2021-08-29 DIAGNOSIS — E1169 Type 2 diabetes mellitus with other specified complication: Secondary | ICD-10-CM | POA: Diagnosis present

## 2021-08-29 DIAGNOSIS — Z743 Need for continuous supervision: Secondary | ICD-10-CM | POA: Diagnosis not present

## 2021-08-29 DIAGNOSIS — E785 Hyperlipidemia, unspecified: Secondary | ICD-10-CM | POA: Diagnosis present

## 2021-08-29 DIAGNOSIS — Z825 Family history of asthma and other chronic lower respiratory diseases: Secondary | ICD-10-CM

## 2021-08-29 DIAGNOSIS — I517 Cardiomegaly: Secondary | ICD-10-CM | POA: Diagnosis not present

## 2021-08-29 DIAGNOSIS — R4781 Slurred speech: Secondary | ICD-10-CM | POA: Diagnosis not present

## 2021-08-29 DIAGNOSIS — R4182 Altered mental status, unspecified: Secondary | ICD-10-CM | POA: Diagnosis not present

## 2021-08-29 DIAGNOSIS — I10 Essential (primary) hypertension: Secondary | ICD-10-CM | POA: Diagnosis not present

## 2021-08-29 DIAGNOSIS — E11319 Type 2 diabetes mellitus with unspecified diabetic retinopathy without macular edema: Secondary | ICD-10-CM

## 2021-08-29 DIAGNOSIS — M1990 Unspecified osteoarthritis, unspecified site: Secondary | ICD-10-CM | POA: Diagnosis present

## 2021-08-29 DIAGNOSIS — D649 Anemia, unspecified: Secondary | ICD-10-CM | POA: Diagnosis present

## 2021-08-29 DIAGNOSIS — Z20822 Contact with and (suspected) exposure to covid-19: Secondary | ICD-10-CM | POA: Diagnosis present

## 2021-08-29 DIAGNOSIS — J9811 Atelectasis: Secondary | ICD-10-CM | POA: Diagnosis not present

## 2021-08-29 DIAGNOSIS — Z66 Do not resuscitate: Secondary | ICD-10-CM | POA: Diagnosis present

## 2021-08-29 DIAGNOSIS — N184 Chronic kidney disease, stage 4 (severe): Secondary | ICD-10-CM | POA: Diagnosis present

## 2021-08-29 DIAGNOSIS — F32A Depression, unspecified: Secondary | ICD-10-CM | POA: Diagnosis present

## 2021-08-29 DIAGNOSIS — I129 Hypertensive chronic kidney disease with stage 1 through stage 4 chronic kidney disease, or unspecified chronic kidney disease: Secondary | ICD-10-CM | POA: Diagnosis present

## 2021-08-29 LAB — CBC WITH DIFFERENTIAL/PLATELET
Abs Immature Granulocytes: 0.05 10*3/uL (ref 0.00–0.07)
Basophils Absolute: 0.1 10*3/uL (ref 0.0–0.1)
Basophils Relative: 1 %
Eosinophils Absolute: 0.4 10*3/uL (ref 0.0–0.5)
Eosinophils Relative: 7 %
HCT: 37.3 % (ref 36.0–46.0)
Hemoglobin: 11.4 g/dL — ABNORMAL LOW (ref 12.0–15.0)
Immature Granulocytes: 1 %
Lymphocytes Relative: 22 %
Lymphs Abs: 1.2 10*3/uL (ref 0.7–4.0)
MCH: 27.6 pg (ref 26.0–34.0)
MCHC: 30.6 g/dL (ref 30.0–36.0)
MCV: 90.3 fL (ref 80.0–100.0)
Monocytes Absolute: 0.4 10*3/uL (ref 0.1–1.0)
Monocytes Relative: 7 %
Neutro Abs: 3.3 10*3/uL (ref 1.7–7.7)
Neutrophils Relative %: 62 %
Platelets: 174 10*3/uL (ref 150–400)
RBC: 4.13 MIL/uL (ref 3.87–5.11)
RDW: 13.4 % (ref 11.5–15.5)
WBC: 5.4 10*3/uL (ref 4.0–10.5)
nRBC: 0 % (ref 0.0–0.2)

## 2021-08-29 LAB — COMPREHENSIVE METABOLIC PANEL
ALT: 14 U/L (ref 0–44)
AST: 19 U/L (ref 15–41)
Albumin: 3.2 g/dL — ABNORMAL LOW (ref 3.5–5.0)
Alkaline Phosphatase: 73 U/L (ref 38–126)
Anion gap: 7 (ref 5–15)
BUN: 19 mg/dL (ref 8–23)
CO2: 30 mmol/L (ref 22–32)
Calcium: 9 mg/dL (ref 8.9–10.3)
Chloride: 102 mmol/L (ref 98–111)
Creatinine, Ser: 1.29 mg/dL — ABNORMAL HIGH (ref 0.44–1.00)
GFR, Estimated: 39 mL/min — ABNORMAL LOW (ref >60)
Glucose, Bld: 177 mg/dL — ABNORMAL HIGH (ref 70–99)
Potassium: 4.4 mmol/L (ref 3.5–5.1)
Sodium: 139 mmol/L (ref 135–145)
Total Bilirubin: 0.5 mg/dL (ref 0.3–1.2)
Total Protein: 5.4 g/dL — ABNORMAL LOW (ref 6.5–8.1)

## 2021-08-29 LAB — TYPE AND SCREEN
ABO/RH(D): A POS
Antibody Screen: NEGATIVE

## 2021-08-29 LAB — URINALYSIS, ROUTINE W REFLEX MICROSCOPIC
Bilirubin Urine: NEGATIVE
Glucose, UA: NEGATIVE mg/dL
Hgb urine dipstick: NEGATIVE
Ketones, ur: NEGATIVE mg/dL
Leukocytes,Ua: NEGATIVE
Nitrite: POSITIVE — AB
Protein, ur: NEGATIVE mg/dL
Specific Gravity, Urine: 1.025 (ref 1.005–1.030)
pH: 6 (ref 5.0–8.0)

## 2021-08-29 LAB — CBG MONITORING, ED
Glucose-Capillary: 169 mg/dL — ABNORMAL HIGH (ref 70–99)
Glucose-Capillary: 154 mg/dL — ABNORMAL HIGH (ref 70–99)

## 2021-08-29 LAB — URINALYSIS, MICROSCOPIC (REFLEX)

## 2021-08-29 LAB — TROPONIN I (HIGH SENSITIVITY)
Troponin I (High Sensitivity): 7 ng/L (ref <18)
Troponin I (High Sensitivity): 7 ng/L (ref <18)

## 2021-08-29 LAB — PROTIME-INR
INR: 1 (ref 0.8–1.2)
Prothrombin Time: 13.6 seconds (ref 11.4–15.2)

## 2021-08-29 LAB — I-STAT CHEM 8, ED
BUN: 22 mg/dL (ref 8–23)
Calcium, Ion: 1.18 mmol/L (ref 1.15–1.40)
Chloride: 102 mmol/L (ref 98–111)
Creatinine, Ser: 1.2 mg/dL — ABNORMAL HIGH (ref 0.44–1.00)
Glucose, Bld: 174 mg/dL — ABNORMAL HIGH (ref 70–99)
HCT: 36 % (ref 36.0–46.0)
Hemoglobin: 12.2 g/dL (ref 12.0–15.0)
Potassium: 4.2 mmol/L (ref 3.5–5.1)
Sodium: 139 mmol/L (ref 135–145)
TCO2: 30 mmol/L (ref 22–32)

## 2021-08-29 LAB — RESP PANEL BY RT-PCR (FLU A&B, COVID) ARPGX2
Influenza A by PCR: NEGATIVE
Influenza B by PCR: NEGATIVE
SARS Coronavirus 2 by RT PCR: NEGATIVE

## 2021-08-29 LAB — LACTIC ACID, PLASMA: Lactic Acid, Venous: 0.6 mmol/L (ref 0.5–1.9)

## 2021-08-29 LAB — GLUCOSE, CAPILLARY: Glucose-Capillary: 273 mg/dL — ABNORMAL HIGH (ref 70–99)

## 2021-08-29 MED ORDER — HYDRALAZINE HCL 25 MG PO TABS
100.0000 mg | ORAL_TABLET | Freq: Three times a day (TID) | ORAL | Status: DC
Start: 2021-08-29 — End: 2021-09-02
  Administered 2021-08-29 – 2021-09-01 (×9): 100 mg via ORAL
  Filled 2021-08-29 (×9): qty 4

## 2021-08-29 MED ORDER — AMLODIPINE BESYLATE 10 MG PO TABS
10.0000 mg | ORAL_TABLET | Freq: Every day | ORAL | Status: DC
  Administered 2021-08-30 – 2021-09-01 (×3): 10 mg via ORAL
  Filled 2021-08-29 (×3): qty 1

## 2021-08-29 MED ORDER — MIRTAZAPINE 15 MG PO TABS
15.0000 mg | ORAL_TABLET | Freq: Every day | ORAL | Status: DC
  Administered 2021-08-29 – 2021-09-01 (×4): 15 mg via ORAL
  Filled 2021-08-29 (×5): qty 1

## 2021-08-29 MED ORDER — METOPROLOL SUCCINATE ER 25 MG PO TB24: 12.5000 mg | ORAL_TABLET | Freq: Two times a day (BID) | ORAL | Status: DC

## 2021-08-29 MED ORDER — ACETAMINOPHEN 650 MG RE SUPP: 650.0000 mg | Freq: Four times a day (QID) | RECTAL | Status: DC | PRN

## 2021-08-29 MED ORDER — ENOXAPARIN SODIUM 40 MG/0.4ML IJ SOSY
40.0000 mg | PREFILLED_SYRINGE | INTRAMUSCULAR | Status: DC
  Administered 2021-08-29: 40 mg via SUBCUTANEOUS
  Filled 2021-08-29: qty 0.4

## 2021-08-29 MED ORDER — ACETAMINOPHEN 325 MG PO TABS: 650.0000 mg | ORAL_TABLET | Freq: Four times a day (QID) | ORAL | Status: DC | PRN

## 2021-08-29 MED ORDER — SODIUM CHLORIDE 0.9 % IV SOLN: 250.0000 mL | INTRAVENOUS | Status: DC | PRN

## 2021-08-29 MED ORDER — INSULIN ASPART 100 UNIT/ML IJ SOLN
0.0000 [IU] | Freq: Three times a day (TID) | INTRAMUSCULAR | Status: DC
  Administered 2021-08-29: 2 [IU] via SUBCUTANEOUS
  Administered 2021-08-30: 1 [IU] via SUBCUTANEOUS
  Administered 2021-08-30 – 2021-08-31 (×3): 2 [IU] via SUBCUTANEOUS
  Administered 2021-08-31: 3 [IU] via SUBCUTANEOUS
  Administered 2021-08-31: 1 [IU] via SUBCUTANEOUS
  Administered 2021-09-01 (×3): 2 [IU] via SUBCUTANEOUS

## 2021-08-29 MED ORDER — ALPRAZOLAM 0.5 MG PO TABS
0.5000 mg | ORAL_TABLET | Freq: Two times a day (BID) | ORAL | Status: DC | PRN
  Administered 2021-08-30 – 2021-08-31 (×2): 0.5 mg via ORAL
  Filled 2021-08-29 (×2): qty 1

## 2021-08-29 MED ORDER — TRANDOLAPRIL 1 MG PO TABS
4.0000 mg | ORAL_TABLET | Freq: Every day | ORAL | Status: DC
  Administered 2021-08-30 – 2021-09-01 (×3): 4 mg via ORAL
  Filled 2021-08-29 (×3): qty 4

## 2021-08-29 MED ORDER — SERTRALINE HCL 50 MG PO TABS
50.0000 mg | ORAL_TABLET | Freq: Every day | ORAL | Status: DC
  Administered 2021-08-30 – 2021-09-01 (×3): 50 mg via ORAL
  Filled 2021-08-29 (×3): qty 1

## 2021-08-29 MED ORDER — ALBUTEROL SULFATE (2.5 MG/3ML) 0.083% IN NEBU: 3.0000 mL | INHALATION_SOLUTION | RESPIRATORY_TRACT | Status: DC | PRN

## 2021-08-29 MED ORDER — IPRATROPIUM-ALBUTEROL 0.5-2.5 (3) MG/3ML IN SOLN
3.0000 mL | Freq: Four times a day (QID) | RESPIRATORY_TRACT | Status: AC
  Administered 2021-08-29 – 2021-08-30 (×2): 3 mL via RESPIRATORY_TRACT
  Filled 2021-08-29 (×2): qty 3

## 2021-08-29 MED ORDER — ALBUTEROL SULFATE (2.5 MG/3ML) 0.083% IN NEBU
2.5000 mg | INHALATION_SOLUTION | Freq: Once | RESPIRATORY_TRACT | Status: AC
  Administered 2021-08-29: 2.5 mg via RESPIRATORY_TRACT
  Filled 2021-08-29: qty 3

## 2021-08-29 MED ORDER — SODIUM CHLORIDE 0.9 % IV SOLN
1.0000 g | INTRAVENOUS | Status: AC
  Administered 2021-08-30 – 2021-08-31 (×2): 1 g via INTRAVENOUS
  Filled 2021-08-29 (×2): qty 10

## 2021-08-29 MED ORDER — SODIUM CHLORIDE 0.9% FLUSH
3.0000 mL | Freq: Two times a day (BID) | INTRAVENOUS | Status: DC
  Administered 2021-08-30 – 2021-09-01 (×5): 3 mL via INTRAVENOUS

## 2021-08-29 MED ORDER — SODIUM CHLORIDE 0.9 % IV SOLN
1.0000 g | Freq: Once | INTRAVENOUS | Status: AC
  Administered 2021-08-29: 1 g via INTRAVENOUS
  Filled 2021-08-29: qty 10

## 2021-08-29 MED ORDER — INSULIN ASPART 100 UNIT/ML IJ SOLN: 0.0000 [IU] | Freq: Three times a day (TID) | INTRAMUSCULAR | Status: DC

## 2021-08-29 MED ORDER — SODIUM CHLORIDE 0.9% FLUSH: 3.0000 mL | INTRAVENOUS | Status: DC | PRN

## 2021-08-29 NOTE — Assessment & Plan Note (Signed)
At baseline (1.35-1.77), continue to monitor

## 2021-08-29 NOTE — ED Provider Notes (Addendum)
°  Physical Exam  BP (!) 188/74    Pulse (!) 56    Temp 98 F (36.7 C) (Rectal)    Resp 13    Ht 5\' 3"  (1.6 m)    Wt 83.9 kg    SpO2 96%    BMI 32.77 kg/m   Physical Exam  Procedures  Procedures  ED Course / MDM   Clinical Course as of 08/29/21 1742  Tue Aug 29, 2021  1741 Urinalysis, Microscopic (reflex)(!) uti [NP]  1741 I-stat chem 8, ED(!) Cr elevated, but baseline [NP]  1742 CBC with Differential(!) Mild anemia, but stable [NP]    Clinical Course User Index [NP] Davonna Belling, MD   Medical Decision Making Amount and/or Complexity of Data Reviewed Independent Historian:     Details: child Labs:  Decision-making details documented in ED Course. Radiology: independent interpretation performed. Decision-making details documented in ED Course.   Received patient in signout.  Generalized weakness.  Elderly.  Found to have UTI.  Discussed with patient and daughter.  Do not feel that she can manage at home right now with the generalized weakness from the UTI.  Will admit to hospitalist for further treat       Davonna Belling, MD 08/29/21 1739    Davonna Belling, MD 08/29/21 [phone]

## 2021-08-29 NOTE — Assessment & Plan Note (Addendum)
Patient with wheezing on exam, but denies any shortness of breath/cough No history of copd, but she tells me she had asthma as a child -will schedule duoneb and albuterol prn -CXR with mild pulmonary vascular congestion and cardiomegaly. Check BNP, doesn't appear volume overloaded  -continue to follow clinically  -had wheezing in November when admitted for viral infection.

## 2021-08-29 NOTE — ED Notes (Signed)
Meal tray ordered for pt at this time.

## 2021-08-29 NOTE — ED Notes (Signed)
Pt transported to CT via stretcher at this time.  

## 2021-08-29 NOTE — Assessment & Plan Note (Addendum)
Has been hypertensive since being in ED Continue home medication: trandolapril, hydralazine, norvasc  Holding home toprol-xl with bradycardia and weakness

## 2021-08-29 NOTE — Telephone Encounter (Signed)
Patient is complaining of not feeling well.  Blood pressure is 197/96, oxygen 93% and temp is 99.  Advised that patient should go to hospital.

## 2021-08-29 NOTE — Assessment & Plan Note (Signed)
Appears at baseline, continue to monitor

## 2021-08-29 NOTE — Assessment & Plan Note (Addendum)
88 year old female who presented for weakness found to have UTI -lives alone and felt too weak to go home from ED.  -work up unremarkable except for UA suspicious for infection -continue rocephin, culture pending -Pt/OT to eval/treat. She does have daily care at home.  -CT head negative for acute event -TSH recently checked and wnl  -patient bradycardic, hold home beta blocker to make sure not contributing to weakness -check orthostatics.  -will continue to monitor clinically, treat UTI and f/u on PT/OT recommendations.

## 2021-08-29 NOTE — ED Provider Notes (Signed)
[facility] Provider Note   CSN: [id number] Arrival date & time: 08/29/21  1401     History  Chief Complaint  Patient presents with   Weakness    Renee [name] is a 88 y.o. female.  88 year old female with prior medical history as detailed below presents for evaluation.  Patient complains of feeling weak.  She reports that this began yesterday.  She reports feeling "hot" last night.  She decided to come to the ED for evaluation today.  EMS reports that family was also concerned for this generalized weakness.  Patient does have prior left-sided deficits from prior CVA.  Patient denies visual change, speech change, new focal weakness, pain, or other complaint.  The history is provided by the patient.  Weakness Severity:  Moderate Onset quality:  Gradual Duration:  1 day Timing:  Constant Progression:  Unchanged Chronicity:  New     Home Medications Prior to Admission medications   Medication Sig Start Date End Date Taking? Authorizing Provider  acetaminophen (TYLENOL) 500 MG tablet Take 1,000 mg by mouth at bedtime.    [provider]  albuterol (PROVENTIL) (2.5 MG/3ML) 0.083% nebulizer solution INHALE 1 VIAL CONTENTS INTO THE LUNGS BY NEBULIZER EVERY 2HRS AS NEEDED FOR SHORTNESS OF BREATH 07/18/21   [name], MD  albuterol (VENTOLIN HFA) 108 (90 Base) MCG/ACT inhaler INHALE 2 PUFFS INTO THE LUNGS EVERY 4 HOURS AS NEEDED FOR WHEEZING/SHORTNESS OF BREATH Patient taking differently: Inhale 2 puffs into the lungs every 4 (four) hours as needed for shortness of breath or wheezing. 10/21/20   Tower, Wynelle Fanny, MD  ALPRAZolam [name]) 0.5 MG tablet Take 1 tablet (0.5 mg total) by mouth 2 (two) times daily. TAKE 1/2 TO 1 TABLET BY MOUTH TWICE A DAY AS NEEDED FOR ANXIETY Strength: 0.5 mg 08/18/21   [name], MD  amLODipine (NORVASC) 10 MG tablet Take 1 tablet (10 mg total) by mouth daily. 09/28/20   [name], MD   aspirin EC 325 MG tablet Take 325 mg by mouth in the morning.    [provider]  Blood Glucose Monitoring Suppl (ONE TOUCH ULTRA 2) w/Device KIT Check blood sugar once daily and as directed. Dx E11.9 08/27/18   Tower, Wynelle Fanny, MD  calcium-vitamin D (OSCAL WITH D) 500-200 MG-UNIT per tablet Take 1 tablet by mouth daily.    [provider]  Cholecalciferol (VITAMIN D-3) 25 MCG (1000 UT) CAPS Take 1,000 Units by mouth in the morning.    [provider]  Cranberry 500 MG TABS Take 500 mg by mouth in the morning and at bedtime.    [provider]  docusate sodium (COLACE) 100 MG capsule Take 200 mg by mouth at bedtime.    [provider]  estradiol (ESTRACE) 0.1 MG/GM vaginal cream PLEASE SEE ATTACHED FOR DETAILED DIRECTIONS 04/25/21   [provider]  FLONASE 50 MCG/ACT nasal spray Place 2 sprays into both nostrils See admin instructions. Instill 2 sprays into each nostril at bedtime when not using Nasacort    [provider]  glipiZIDE (GLIPIZIDE XL) 10 MG 24 hr tablet Take 1 tablet (10 mg total) by mouth daily with breakfast. 02/28/21   [name], MD  guaiFENesin (MUCINEX) 600 MG 12 hr tablet Take 2 tablets (1,200 mg total) by mouth 2 (two) times daily. 06/26/21   [name], DO  hydrALAZINE (APRESOLINE) 100 MG tablet Take 1 tablet (100 mg total) by mouth 3 (  three) times daily. 09/28/20   [name], MD  Lancets [facility] DELICA PLUS [id number]) MISC CHECK BLOOD SUGAR ONCE DAILY AND AS DIRECTED 08/01/21   [name], MD  metoprolol succinate (TOPROL-XL) 25 MG 24 hr tablet Take 0.5 tablets (12.5 mg total) by mouth 2 (two) times daily. Patient taking differently: Take 12.5 mg by mouth in the morning and at bedtime. 09/28/20   [name], MD  mirtazapine (REMERON) 15 MG tablet Take 1 tablet (15 mg total) by mouth at bedtime. 05/26/21   [name], MD  Multiple Vitamin (MULTIVITAMIN) capsule Take 1 capsule by  mouth daily.    [provider]  Multiple Vitamins-Minerals (PRESERVISION AREDS 2+MULTI VIT) CAPS Take 1 capsule by mouth in the morning and at bedtime.    [provider]  Olopatadine HCl (PATADAY OP) Place 1 drop into both eyes 2 (two) times daily as needed (for itching or redness).    [provider]  ONETOUCH ULTRA test strip CHECK BLOOD SUGAR ONCE DAILY AND AS DIRECTED. DX E11.9 07/03/21   [name], MD  pioglitazone (ACTOS) 15 MG tablet Take 1 tablet (15 mg total) by mouth daily. 05/26/21   [name], MD  predniSONE (DELTASONE) 10 MG tablet Take 3 tablets (30 mg) daily for 2 days, then, Take 2 tablets (20 mg) daily for 2 days, then, Take 1 tablets (10 mg) daily for 1 days, then stop. Patient not taking: Reported on 07/10/2021 07/10/21   [name], NP  sertraline (ZOLOFT) 50 MG tablet TAKE 1 TABLET BY MOUTH EVERY DAY 07/05/21   [name], MD  sitaGLIPtin (JANUVIA) 100 MG tablet Take 1 tablet (100 mg total) by mouth daily. 09/28/20   [name], MD  trandolapril (MAVIK) 4 MG tablet Take 1 tablet (4 mg total) by mouth daily. 09/28/20   [name], MD  triamcinolone (NASACORT) 55 MCG/ACT AERO nasal inhaler Place 2 sprays into the nose daily. Patient taking differently: Place 2 sprays into the nose See admin instructions. Instill 2 sprays into each nostril at bedtime when not using Flonase 03/28/20   [name], MD  vitamin B-12 (CYANOCOBALAMIN) 1000 MCG tablet Take 1,000 mcg by mouth daily.     [provider]      Allergies    Tape, Diclofenac sodium, and Pioglitazone    Review of Systems   Review of Systems  Neurological:  Positive for weakness.  All other systems reviewed and are negative.  Physical Exam Updated Vital Signs BP (!) 182/69    Pulse (!) 58    Resp (!) 22    Ht 5' 3"  (1.6 m)    Wt 83.9 kg    SpO2 97%    BMI 32.77 kg/m  Physical Exam Vitals and nursing note reviewed.  Constitutional:       General: She is not in acute distress.    Appearance: Normal appearance. She is well-developed.  HENT:     Head: Normocephalic and atraumatic.  Eyes:     Conjunctiva/sclera: Conjunctivae normal.     Pupils: Pupils are equal, round, and reactive to light.  Cardiovascular:     Rate and Rhythm: Normal rate and regular rhythm.     Heart sounds: Normal heart sounds.  Pulmonary:     Effort: Pulmonary effort is normal. No respiratory distress.     Breath sounds: Normal breath sounds.  Abdominal:     General: There is no distension.  Palpations: Abdomen is soft.     Tenderness: There is no abdominal tenderness.  Musculoskeletal:        General: No deformity. Normal range of motion.     Cervical back: Normal range of motion and neck supple.  Skin:    General: Skin is warm and dry.  Neurological:     General: No focal deficit present.     Mental Status: She is alert and oriented to person, place, and time. Mental status is at baseline.     Cranial Nerves: No cranial nerve deficit.    ED Results / Procedures / Treatments   Labs (all labs ordered are listed, but only abnormal results are displayed) Labs Reviewed  CULTURE, BLOOD (ROUTINE X 2)  CULTURE, BLOOD (ROUTINE X 2)  URINE CULTURE  RESP PANEL BY RT-PCR (FLU A&B, COVID) [id number]  URINALYSIS, ROUTINE W REFLEX MICROSCOPIC  COMPREHENSIVE METABOLIC PANEL  CBC WITH DIFFERENTIAL/PLATELET  PROTIME-INR  I-STAT CHEM 8, ED  CBG MONITORING, ED  TYPE AND SCREEN  TROPONIN I (HIGH SENSITIVITY)    EKG None  Radiology No results found.  Procedures Procedures    Medications Ordered in ED Medications - No data to display  ED Course/ Medical Decision Making/ A&P Clinical Course as of 08/30/21 1540  Tue Aug 29, 2021  1741 Urinalysis, Microscopic (reflex)(!) uti [NP]  1741 I-stat chem 8, ED(!) Cr elevated, but baseline [NP]  1742 CBC with Differential(!) Mild anemia, but stable [NP]    Clinical Course User Index [NP]  Davonna Belling, MD                           Medical Decision Making   Medical Screen Complete  This patient presented to the ED with complaint of generalized weakness.  This complaint involves an extensive number of treatment options. The initial differential diagnosis includes, but is not limited to, ischemic pathology, infectious pathology, metabolic abnormality, etc.  This presentation is: Acute, Chronic, Self-Limited, Previously Undiagnosed, Uncertain Prognosis, Complicated, Systemic Symptoms, and Threat to Life/Bodily Function  Patient present with complaint of generalized weakness.  Initial screening labs ordered.  Pending results and final disposition signed over to Dr. Alvino Chapel.   Co morbidities that complicated the patient's evaluation  Past age, OSA, CKD, diabetes, hypertension, history of stroke   Additional history obtained:  External records from outside sources obtained and reviewed including prior ED visits and prior Inpatient records.            Final Clinical Impression(s) / ED Diagnoses Final diagnoses:  Acute cystitis without hematuria    Rx / DC Orders ED Discharge Orders     None         Valarie Merino, MD 08/30/21 1544

## 2021-08-29 NOTE — Assessment & Plan Note (Signed)
Stable continue zoloft, remeron and xanax prn

## 2021-08-29 NOTE — Assessment & Plan Note (Signed)
-  Patient presenting with encephalopathy as evidenced by confusion  -Her acute metabolic encephalopathy is suspected to be due to a UTI at this time based on abnormal UA; urine culture is pending. -head CT with no acute findings

## 2021-08-29 NOTE — Assessment & Plan Note (Signed)
No longer on cpap

## 2021-08-29 NOTE — Assessment & Plan Note (Signed)
a1c 8.3 on 05/2021 Hold oral meds (actos, glucotrol and januvia) SSI and accuchecks per protocol

## 2021-08-29 NOTE — ED Triage Notes (Signed)
Pt BIB [facility] from home with family reported slurred speech. Negative stroke screen otherwise. Per EMS, family also concerned for generalized weakness. L sided deficits from previous CVA.  LKW 2000 on 08/28/21

## 2021-08-29 NOTE — ED Notes (Signed)
Per Dr. Rogers Blocker, check CBG prior to eating dinner and cover with sliding scale. See modified orders.

## 2021-08-29 NOTE — H&P (Signed)
History and Physical    Renee [name] QQV:[id number] DOB: [birth year] DOA: 08/29/2021  PCP: [name], MD Consultants:  nephrology: Dr. Holley Raring  Patient coming from:  Home - lives alone. Has a caregiver that comes 5x/week. On weekend she has another person come in to help.   Chief Complaint: weakness  HPI: Renee [name] is a 88 y.o. female with medical history significant of T2DM, HTN, HLD, hx of CVA with residual left weakness, OSA, CKD stage IV, anxiety and depression who presents to ED with weakness. She states she didn't feel good last night and felt "hot." She didn't feel right. When she got up this AM she thought her feet felt weird and called EMS so she was brought to the ED. Denies any unilateral weakness, slurred speech or droopy face. She denies any confusion.   She denies any fever/chills, no headache or vision changes, no chest pain or palpitations, no shortness of breath, occasional cough, no stomach pain, no N/V/D. She has no dysuria, but has increased frequency and urgency. No blood in her urine and no CVA tenderness. She has mild leg swelling, at baseline. She has been eating and drinking well.   She needs assistance with ADLs. She does not drive.    ED Course: vitals: afebrile, bp: 169/59, HR: 55, RR: 23, oxygen: 96%RA Pertinent labs: UA: +nitrite, creatinine 1.29 (1.35-1.77), hgb: 11.4, cultures pending CXR: cardiomegaly and mild pulmonary vascular congestion  CTH: no acute finding In ED: given rocephin and TRH was asked to admit.    Review of Systems: As per HPI; otherwise review of systems reviewed and negative.   Ambulatory Status:  Ambulates with walker    Past Medical History:  Diagnosis Date   Angioedema    possibly from voltaren   Bronchopneumonia 12/11/2016   Degenerative disc disease    Diabetes mellitus    type II   Hyperlipidemia    Hypertension    LVH (left ventricular hypertrophy)    and atrial enlargement by echo in past with nl EF    Nasal pruritis    Osteoarthritis    Osteopenia    Renal insufficiency    Sleep apnea    Stroke ([place]) 05/2010   Small vessel sobcortical (in [place]) with Dr Leonie Man, residual L hemiparesis   Vitamin B 12 deficiency 04/08    Past Surgical History:  Procedure Laterality Date   ABDOMINAL HYSTERECTOMY     BSO-fibroids   APPENDECTOMY     BACK SURGERY     COLON SURGERY     due to punctured intestines   EYE SURGERY     cataract extraction   IR THORACENTESIS ASP PLEURAL SPACE W/IMG GUIDE  06/07/2020   KNEE SURGERY     arthroscope   PARS PLANA VITRECTOMY  07/31/2011   Procedure: PARS PLANA VITRECTOMY WITH 25 GAUGE;  Surgeon: [name], MD;  Location: [facility];  Service: Ophthalmology;  Laterality: Right;  REMOVAL OF SILICONE OIL AND LASER RIGHT EYE   RETINAL DETACHMENT SURGERY  02/18/11   times 2   SPINE SURGERY  08/09   spinal decompression surgery    Social History   Socioeconomic History   Marital status: Widowed    Spouse name: Not on file   Number of children: Not on file   Years of education: Not on file   Highest education level: Not on file  Occupational History   Not on file  Tobacco Use   Smoking status: Never   Smokeless  tobacco: Never  Vaping Use   Vaping Use: Not on file  Substance and Sexual Activity   Alcohol use: No    Alcohol/week: 0.0 standard drinks   Drug use: No   Sexual activity: Never  Other Topics Concern   Not on file  Social History Narrative   Not on file   Social Determinants of Health   Financial Resource Strain: Low Risk    Difficulty of Paying Living Expenses: Not hard at all  Food Insecurity: No Food Insecurity   Worried About Charity fundraiser in the Last Year: Never true   [place] in the Last Year: Never true  Transportation Needs: No Transportation Needs   Lack of Transportation (Medical): No   Lack of Transportation (Non-Medical): No  Physical Activity: Inactive   Days of Exercise per Week: 0 days   Minutes of  Exercise per Session: 0 min  Stress: No Stress Concern Present   Feeling of Stress : Not at all  Social Connections: Socially Isolated   Frequency of Communication with Friends and Family: More than three times a week   Frequency of Social Gatherings with Friends and Family: Once a week   Attends Religious Services: Never   Marine scientist or Organizations: No   Attends Archivist Meetings: Never   Marital Status: Widowed  Human resources officer Violence: Not At Risk   Fear of Current or Ex-Partner: No   Emotionally Abused: No   Physically Abused: No   Sexually Abused: No    Allergies  Allergen Reactions   Tape Other (See Comments)    The patient's skin is VERY THIN- will TEAR EASILY!!   Diclofenac Sodium Swelling and Other (See Comments)    Angioedema   Pioglitazone Swelling and Other (See Comments)    Re-started in 2022    Family History  Problem Relation Age of Onset   COPD Brother    Cancer Sister        brain tumor with hemmorhage   Heart disease Sister        CAD    Prior to Admission medications   Medication Sig Start Date End Date Taking? Authorizing Provider  acetaminophen (TYLENOL) 500 MG tablet Take 1,000 mg by mouth at bedtime.    [provider]  albuterol (PROVENTIL) (2.5 MG/3ML) 0.083% nebulizer solution INHALE 1 VIAL CONTENTS INTO THE LUNGS BY NEBULIZER EVERY 2HRS AS NEEDED FOR SHORTNESS OF BREATH 07/18/21   [name], MD  albuterol (VENTOLIN HFA) 108 (90 Base) MCG/ACT inhaler INHALE 2 PUFFS INTO THE LUNGS EVERY 4 HOURS AS NEEDED FOR WHEEZING/SHORTNESS OF BREATH Patient taking differently: Inhale 2 puffs into the lungs every 4 (four) hours as needed for shortness of breath or wheezing. 10/21/20   Tower, Wynelle Fanny, MD  ALPRAZolam [name]) 0.5 MG tablet Take 1 tablet (0.5 mg total) by mouth 2 (two) times daily. TAKE 1/2 TO 1 TABLET BY MOUTH TWICE A DAY AS NEEDED FOR ANXIETY Strength: 0.5 mg 08/18/21   [name], MD  amLODipine  (NORVASC) 10 MG tablet Take 1 tablet (10 mg total) by mouth daily. 09/28/20   [name], MD  aspirin EC 325 MG tablet Take 325 mg by mouth in the morning.    [provider]  Blood Glucose Monitoring Suppl (ONE TOUCH ULTRA 2) w/Device KIT Check blood sugar once daily and as directed. Dx E11.9 08/27/18   Tower, Wynelle Fanny, MD  calcium-vitamin D (OSCAL WITH D) [phone]  MG-UNIT per tablet Take 1 tablet by mouth daily.    [provider]  Cholecalciferol (VITAMIN D-3) 25 MCG (1000 UT) CAPS Take 1,000 Units by mouth in the morning.    [provider]  Cranberry 500 MG TABS Take 500 mg by mouth in the morning and at bedtime.    [provider]  docusate sodium (COLACE) 100 MG capsule Take 200 mg by mouth at bedtime.    [provider]  estradiol (ESTRACE) 0.1 MG/GM vaginal cream PLEASE SEE ATTACHED FOR DETAILED DIRECTIONS 04/25/21   [provider]  FLONASE 50 MCG/ACT nasal spray Place 2 sprays into both nostrils See admin instructions. Instill 2 sprays into each nostril at bedtime when not using Nasacort    [provider]  glipiZIDE (GLIPIZIDE XL) 10 MG 24 hr tablet Take 1 tablet (10 mg total) by mouth daily with breakfast. 02/28/21   [name], MD  guaiFENesin (MUCINEX) 600 MG 12 hr tablet Take 2 tablets (1,200 mg total) by mouth 2 (two) times daily. 06/26/21   [name], DO  hydrALAZINE (APRESOLINE) 100 MG tablet Take 1 tablet (100 mg total) by mouth 3 (three) times daily. 09/28/20   [name], MD  Lancets [facility] DELICA PLUS [id number]) MISC CHECK BLOOD SUGAR ONCE DAILY AND AS DIRECTED 08/01/21   [name], MD  metoprolol succinate (TOPROL-XL) 25 MG 24 hr tablet Take 0.5 tablets (12.5 mg total) by mouth 2 (two) times daily. Patient taking differently: Take 12.5 mg by mouth in the morning and at bedtime. 09/28/20   [name], MD  mirtazapine (REMERON) 15 MG tablet Take 1 tablet (15 mg total) by mouth at  bedtime. 05/26/21   [name], MD  Multiple Vitamin (MULTIVITAMIN) capsule Take 1 capsule by mouth daily.    [provider]  Multiple Vitamins-Minerals (PRESERVISION AREDS 2+MULTI VIT) CAPS Take 1 capsule by mouth in the morning and at bedtime.    [provider]  Olopatadine HCl (PATADAY OP) Place 1 drop into both eyes 2 (two) times daily as needed (for itching or redness).    [provider]  ONETOUCH ULTRA test strip CHECK BLOOD SUGAR ONCE DAILY AND AS DIRECTED. DX E11.9 07/03/21   [name], MD  pioglitazone (ACTOS) 15 MG tablet Take 1 tablet (15 mg total) by mouth daily. 05/26/21   [name], MD  predniSONE (DELTASONE) 10 MG tablet Take 3 tablets (30 mg) daily for 2 days, then, Take 2 tablets (20 mg) daily for 2 days, then, Take 1 tablets (10 mg) daily for 1 days, then stop. Patient not taking: Reported on 07/10/2021 07/10/21   [name], NP  sertraline (ZOLOFT) 50 MG tablet TAKE 1 TABLET BY MOUTH EVERY DAY 07/05/21   [name], MD  sitaGLIPtin (JANUVIA) 100 MG tablet Take 1 tablet (100 mg total) by mouth daily. 09/28/20   [name], MD  trandolapril (MAVIK) 4 MG tablet Take 1 tablet (4 mg total) by mouth daily. 09/28/20   [name], MD  triamcinolone (NASACORT) 55 MCG/ACT AERO nasal inhaler Place 2 sprays into the nose daily. Patient taking differently: Place 2 sprays into the nose See admin instructions. Instill 2 sprays into each nostril at bedtime when not using Flonase 03/28/20   [name], MD  vitamin B-12 (CYANOCOBALAMIN) 1000 MCG tablet Take 1,000 mcg by mouth daily.     [provider]    Physical Exam: Vitals:  08/29/21 1730 08/29/21 1745 08/29/21 1800 08/29/21 1815  BP: (!) 158/84 (!) 180/76 (!) 175/74 (!) 179/68  Pulse: (!) 44 (!) 54 (!) 55 (!) 57  Resp: 18 (!) _0 Temp:      TempSrc:      SpO2: 94% 96% 97% 97%  Weight:      Height:         General:  Appears calm  and comfortable and is in NAD Eyes:  PERRL, EOMI, normal lids, iris ENT:  hard of hearing, lips & tongue, mmm; appropriate dentition Neck:  no LAD, masses or thyromegaly; no carotid bruits, no JVD Cardiovascular:  RRR, no m/r/g. No LE edema.  Respiratory:   expiratory wheezing throughout. No rales/rhonchi. Normal respiratory effort. Abdomen:  soft, NT, ND, NABS Back:   normal alignment, no CVAT Skin:  no rash or induration seen on limited exam Musculoskeletal:  grossly normal tone BUE/BLE her left side is mildly weak compared to her right, 4/5 on LUE/LLE and 5/5 on RUE and RLE, good ROM, no bony abnormality Lower extremity:  No LE edema.  Limited foot exam with no ulcerations.  2+ distal pulses. Psychiatric:  grossly normal mood and affect, speech fluent and appropriate, AOx3 Neurologic:  CN 2-12 grossly intact, moves all extremities in coordinated fashion, sensation intact    Radiological Exams on Admission: Independently reviewed - see discussion in A/P where applicable  CT Head Wo Contrast  Result Date: 08/29/2021 CLINICAL DATA:  Mental status change.  Unknown cause. EXAM: CT HEAD WITHOUT CONTRAST TECHNIQUE: Contiguous axial images were obtained from the base of the skull through the vertex without intravenous contrast. COMPARISON:  CT examination dated September 02, 2020 FINDINGS: Brain: No evidence of acute infarction, hemorrhage, hydrocephalus, extra-axial collection or mass lesion/mass effect. Encephalomalacia of the right basal ganglia and adjacent white matter. Diffuse low-attenuation of the periventricular white matter presumed chronic microvascular ischemic changes. Vascular: No hyperdense vessel or unexpected calcification. Skull: Normal. Negative for fracture or focal lesion. Sinuses/Orbits: Cataract surgery and right scleral band. Other: None. IMPRESSION: 1. No acute intracranial abnormality. 2. Right MCA territory infarct and chronic microvascular ischemic changes of the white  matter, unchanged. Electronically Signed   By: Keane Police D.O.   On: 08/29/2021 14:59   DG Chest Port 1 View  Result Date: 08/29/2021 CLINICAL DATA:  Weakness.  Slurred speech. EXAM: PORTABLE CHEST 1 VIEW COMPARISON:  One-view chest x-ray 04/23/2021 FINDINGS: Heart is enlarged. Mild pulmonary vascular congestion is present. Minimal atelectasis is present at the left base. Lungs are otherwise clear. Lung volumes are low. IMPRESSION: 1. Cardiomegaly and mild pulmonary vascular congestion. 2. Minimal atelectasis at the left base. Electronically Signed   By: San Morelle M.D.   On: 08/29/2021 15:04    EKG: Independently reviewed.  Sinus bradycardia with rate 58; nonspecific ST changes with no evidence of acute ischemia   Labs on Admission: I have personally reviewed the available labs and imaging studies at the time of the admission.  Pertinent labs:  UA: +nitrite,  creatinine 1.29 (1.35-1.77),  hgb: 11.4,   Assessment/Plan * Weakness secondary to UTI 88 year old female who presented for weakness found to have UTI -lives alone and felt too weak to go home from ED.  -work up unremarkable except for UA suspicious for infection -continue rocephin, culture pending -Pt/OT to eval/treat. She does have daily care at home.  -CT head negative for acute event -TSH recently checked and wnl  -patient bradycardic, hold home beta  blocker to make sure not contributing to weakness -check orthostatics.  -will continue to monitor clinically, treat UTI and f/u on PT/OT recommendations.   Wheezing on expiration- (present on admission) Patient with wheezing on exam, but denies any shortness of breath/cough No history of copd, but she tells me she had asthma as a child -will schedule duoneb and albuterol prn -CXR with mild pulmonary vascular congestion and cardiomegaly. Check BNP, doesn't appear volume overloaded  -continue to follow clinically  -had wheezing in November when admitted for viral  infection.   Type 2 diabetes, controlled, with retinopathy ([place])- (present on admission) a1c 8.3 on 05/2021 Hold oral meds (actos, glucotrol and januvia) SSI and accuchecks per protocol   Essential hypertension- (present on admission) Has been hypertensive since being in ED Continue home medication: trandolapril,hydralazine, norvasc  Holding home toprol-xl with bradycardia and weakness   Anxiety and depression- (present on admission) Stable continue zoloft, remeron and xanax prn   CKD (chronic kidney disease) stage 4, GFR 15-29 ml/min ([place])- (present on admission) At baseline (1.35-1.77), continue to monitor   Hemiplegia, late effect of cerebrovascular disease ([place]) At baseline, CTH with no acute finding Continue ASA   Normocytic anemia- (present on admission) Appears at baseline, continue to monitor   Sleep apnea- (present on admission) No longer on cpap     Body mass index is 32.77 kg/m.   Level of care: Telemetry Medical DVT prophylaxis:  Lovenox  Code Status:  DNR - confirmed with patient/family Family Communication: None present; I spoke with the patient's sister by telephone at the time of admission. [place] Derry Huffines: [phone] Disposition Plan:  The patient is from: home  Anticipated d/c is to: home   Patient placed in observation as anticipate less than 2 midnight stay. Requires hospitalization due to weakness secondary to UTI, need for IV abx and evaluation by therapies to make sure safe to return home.    Patient is currently: stable  Consults called: PT/OT  Admission status:  observation    Orma Flaming MD [facility]   How to contact the [facility] Attending or Consulting provider [place] or covering provider during after hours [place], for this patient?  Check the care team in [facility] and look for a) attending/consulting TRH provider listed and b) the [facility] team listed Log into [email or web address] and use [place]'s universal password to access. If you do  not have the password, please contact the hospital operator. Locate the [facility] provider you are looking for under [facility] and page to a number that you can be directly reached. If you still have difficulty reaching the provider, please page the [facility] (Director on Call) for the Hospitalists listed on amion for assistance.   08/29/2021, 7:40 PM

## 2021-08-29 NOTE — Assessment & Plan Note (Addendum)
At baseline, CTH with no acute finding Continue ASA

## 2021-08-30 ENCOUNTER — Other Ambulatory Visit: Payer: Self-pay

## 2021-08-30 DIAGNOSIS — R531 Weakness: Secondary | ICD-10-CM | POA: Diagnosis not present

## 2021-08-30 LAB — BASIC METABOLIC PANEL
Anion gap: 8 (ref 5–15)
BUN: 21 mg/dL (ref 8–23)
CO2: 26 mmol/L (ref 22–32)
Calcium: 8.7 mg/dL — ABNORMAL LOW (ref 8.9–10.3)
Chloride: 103 mmol/L (ref 98–111)
Creatinine, Ser: 1.55 mg/dL — ABNORMAL HIGH (ref 0.44–1.00)
GFR, Estimated: 32 mL/min — ABNORMAL LOW (ref >60)
Glucose, Bld: 284 mg/dL — ABNORMAL HIGH (ref 70–99)
Potassium: 4.7 mmol/L (ref 3.5–5.1)
Sodium: 137 mmol/L (ref 135–145)

## 2021-08-30 LAB — CBC
HCT: 34.5 % — ABNORMAL LOW (ref 36.0–46.0)
Hemoglobin: 10.9 g/dL — ABNORMAL LOW (ref 12.0–15.0)
MCH: 28.2 pg (ref 26.0–34.0)
MCHC: 31.6 g/dL (ref 30.0–36.0)
MCV: 89.4 fL (ref 80.0–100.0)
Platelets: 170 10*3/uL (ref 150–400)
RBC: 3.86 MIL/uL — ABNORMAL LOW (ref 3.87–5.11)
RDW: 13.5 % (ref 11.5–15.5)
WBC: 7.2 10*3/uL (ref 4.0–10.5)
nRBC: 0 % (ref 0.0–0.2)

## 2021-08-30 LAB — GLUCOSE, CAPILLARY
Glucose-Capillary: 130 mg/dL — ABNORMAL HIGH (ref 70–99)
Glucose-Capillary: 159 mg/dL — ABNORMAL HIGH (ref 70–99)
Glucose-Capillary: 179 mg/dL — ABNORMAL HIGH (ref 70–99)
Glucose-Capillary: 183 mg/dL — ABNORMAL HIGH (ref 70–99)
Glucose-Capillary: 266 mg/dL — ABNORMAL HIGH (ref 70–99)

## 2021-08-30 LAB — BRAIN NATRIURETIC PEPTIDE: B Natriuretic Peptide: 90.3 pg/mL (ref 0.0–100.0)

## 2021-08-30 LAB — LACTIC ACID, PLASMA: Lactic Acid, Venous: 1.8 mmol/L (ref 0.5–1.9)

## 2021-08-30 LAB — ABO/RH: ABO/RH(D): A POS

## 2021-08-30 MED ORDER — GUAIFENESIN ER 600 MG PO TB12
1200.0000 mg | ORAL_TABLET | Freq: Two times a day (BID) | ORAL | Status: DC
  Administered 2021-08-30 – 2021-09-01 (×5): 1200 mg via ORAL
  Filled 2021-08-30 (×6): qty 2

## 2021-08-30 MED ORDER — OLOPATADINE HCL 0.1 % OP SOLN
1.0000 [drp] | Freq: Two times a day (BID) | OPHTHALMIC | Status: DC | PRN
  Filled 2021-08-30: qty 5

## 2021-08-30 MED ORDER — SODIUM CHLORIDE 0.9 % IV SOLN: INTRAVENOUS | Status: AC

## 2021-08-30 MED ORDER — ENOXAPARIN SODIUM 30 MG/0.3ML IJ SOSY
30.0000 mg | PREFILLED_SYRINGE | INTRAMUSCULAR | Status: DC
  Administered 2021-08-30 – 2021-08-31 (×2): 30 mg via SUBCUTANEOUS
  Filled 2021-08-30 (×3): qty 0.3

## 2021-08-30 MED ORDER — LINAGLIPTIN 5 MG PO TABS
5.0000 mg | ORAL_TABLET | Freq: Every day | ORAL | Status: DC
  Administered 2021-08-30 – 2021-09-01 (×3): 5 mg via ORAL
  Filled 2021-08-30 (×3): qty 1

## 2021-08-30 MED ORDER — FLUTICASONE PROPIONATE 50 MCG/ACT NA SUSP
2.0000 | Freq: Every evening | NASAL | Status: DC | PRN
  Filled 2021-08-30: qty 16

## 2021-08-30 NOTE — TOC Initial Note (Addendum)
Transition of Care [facility]) - Initial/Assessment Note    Patient Details  Name: Renee [name] MRN: [id number] Date of Birth: 06/09/1929  [facility] of [facility]) CM/SW Contact:    Marilu Favre, RN Phone Number: 08/30/2021, 3:21 PM  Clinical Narrative:                 Spoke to patient at bedside. Patient from home alone. Has caregivers from 9 am to 5 pm five days a week.   She has had [facility] in the past and would like them again.   NCM left Levada Dy with [facility] a message.   Patient has all DME. Patient states she has transportation home when discharged.   [place] with [facility] has accepted referral for HHPT and [place]   Expected Discharge Plan: [place] Barriers to Discharge: Continued Medical Work up   Patient Goals and CMS Choice Patient states their goals for this hospitalization and ongoing recovery are:: to return to home CMS Medicare.gov Compare Post Acute Care list provided to:: Patient Choice offered to / list presented to : Patient  Expected Discharge Plan and Services Expected Discharge Plan: [place]   Discharge Planning Services: CM Consult Post Acute Care Choice: [place] arrangements for the past 2 months: Single Family Home                 DME Arranged: N/A         HH Arranged: PT, OT HH Agency: [place] Date [place]: 08/30/21 Time HH Agency Contacted: 1520 Representative spoke with at [place]: Levada Dy left message  Prior Living Arrangements/Services Living arrangements for the past 2 months: Single Family Home Lives with:: Self Patient language and need for interpreter reviewed:: Yes Do you feel safe going back to the place where you live?: Yes      Need for Family Participation in Patient Care: Yes (Comment) Care giver support system in place?: Yes (comment) Current home services: DME Criminal Activity/Legal Involvement Pertinent to Current  Situation/Hospitalization: No - Comment as needed  Activities of Daily Living Home Assistive Devices/Equipment: Walker (specify type), Shower chair with back, Raised toilet seat with rails, Blood pressure cuff, CBG Meter ADL Screening (condition at time of admission) Patient's cognitive ability adequate to safely complete daily activities?: Yes Is the patient deaf or have difficulty hearing?: Yes Does the patient have difficulty seeing, even when wearing glasses/contacts?: No Does the patient have difficulty concentrating, remembering, or making decisions?: No Patient able to express need for assistance with ADLs?: Yes Does the patient have difficulty dressing or bathing?: No Independently performs ADLs?: No Communication: Independent Dressing (OT): Needs assistance Is this a change from baseline?: Pre-admission baseline Grooming: Needs assistance Is this a change from baseline?: Pre-admission baseline Feeding: Independent Bathing: Needs assistance Is this a change from baseline?: Pre-admission baseline Toileting: Needs assistance (at times) Is this a change from baseline?: Pre-admission baseline In/Out Bed: Independent with device (comment) Walks in Home: Independent with device (comment) Does the patient have difficulty walking or climbing stairs?: Yes Weakness of Legs: Both Weakness of Arms/Hands: Right  Permission Sought/Granted   Permission granted to share information with : No              Emotional Assessment Appearance:: Appears stated age Attitude/Demeanor/Rapport: Engaged Affect (typically observed): Accepting Orientation: : Oriented to Self, Oriented to Place, Oriented to  Time, Oriented to Situation Alcohol / Substance Use: Not Applicable Psych Involvement:  No (comment)  Admission diagnosis:  UTI (urinary tract infection) [N39.0] Acute cystitis without hematuria [N30.00] Patient Active Problem List   Diagnosis Date Noted   UTI (urinary tract infection)  08/29/2021   Wheezing on expiration 08/29/2021   Pleural effusion 05/13/2020   Frequent UTI 05/13/2020   Dizziness 02/12/2020   Normocytic anemia 02/12/2020   Fall involving sidewalk curb 11/29/2019   Contusion of back 11/27/2019   Aortic atherosclerosis ([place]) 07/13/2019   CKD (chronic kidney disease) stage 4, GFR 15-29 ml/min (HCC) 07/01/2019   External hemorrhoid 01/17/2018   History of colitis 01/17/2018   Generalized weakness 12/24/2016   Diabetic retinopathy ([place]) 12/11/2016   History of CVA (cerebrovascular accident) 12/11/2016   Weakness secondary to UTI 10/21/2015   Estrogen deficiency 08/30/2015   Mobility impaired 06/26/2011   History of retinal detachment 01/10/2011   Sleep apnea 11/28/2010   Anxiety and depression 08/25/2010   Hemiplegia, late effect of cerebrovascular disease ([place]) 07/05/2010   POSTHERPETIC NEURALGIA 11/09/2009   Renal insufficiency 06/29/2008   Chronic back pain 01/26/2008   EDEMA 01/26/2008   B12 deficiency 01/10/2007   Type 2 diabetes, controlled, with retinopathy ([place]) 11/27/2006   Essential hypertension 11/27/2006   FIBROCYSTIC BREAST DISEASE 11/27/2006   ROSACEA 11/27/2006   OSTEOARTHRITIS 11/27/2006   URINARY INCONTINENCE, MIXED 11/27/2006   PCP:  [name], MD Pharmacy:   [id number] - [place], [place] - [address] [place] [address] Phone: [phone] Fax: [phone]     Social Determinants of Health (SDOH) Interventions    Readmission Risk Interventions Readmission Risk Prevention Plan 06/10/2020  PCP or Specialist appointment within 3-5 days of discharge Complete  Some recent data might be hidden

## 2021-08-30 NOTE — Evaluation (Signed)
Occupational Therapy Evaluation Patient Details Name: Renee [name] MRN: [id number] DOB: 07-08-1929 Today's Date: 08/30/2021   History of Present Illness 88 y.o. female presents to [facility] hospital on 08/29/2021 with weakness. Pt found to have UTI. PMH includes T2DM, HTN, HLD, hx of CVA with residual left weakness, OSA, CKD stage IV, anxiety and depression.   Clinical Impression   Pt presents with decline in function and safety with ADLs and ADL mobility with impaired strength, balance and endurance; pt with L side weakness from previous CVA. PTA pt lived at home alone with PCAs M-F for 8 hrs and Sat/Sun for 4 hours. Pt required assist with meals, LB ADLs and showers, used a RW for mobility, was Ind with UB selfcare, grooming and toileting. Pt currently requires sup to sit EOB, min A with UB selfcare, max A with LB selfcare, mod A with toileting and min - min guard A with mobility using RW. Pt would benefit from acute OT services to address impairments to maximize level of function and safety      Recommendations for follow up therapy are one component of a multi-disciplinary discharge planning process, led by the attending physician.  Recommendations may be updated based on patient status, additional functional criteria and insurance authorization.   Follow Up Recommendations  Home health OT    Assistance Recommended at Discharge Frequent or constant Supervision/Assistance  Patient can return home with the following A little help with walking and/or transfers;A lot of help with bathing/dressing/bathroom;Direct supervision/assist for medications management;Assistance with cooking/housework;Assist for transportation    Functional Status Assessment  Patient has had a recent decline in their functional status and demonstrates the ability to make significant improvements in function in a reasonable and predictable amount of time.  Equipment Recommendations  None recommended by OT    Recommendations  for Other Services       Precautions / Restrictions Precautions Precautions: Fall Restrictions Weight Bearing Restrictions: No      Mobility Bed Mobility Overal bed mobility: Needs Assistance Bed Mobility: Supine to Sit     Supine to sit: Supervision;HOB elevated     General bed mobility comments: pt in chair upon arrival    Transfers Overall transfer level: Needs assistance Equipment used: Rolling walker (2 wheels) Transfers: Sit to/from Stand;Bed to chair/wheelchair/BSC Sit to Stand: Min assist Stand pivot transfers: Min guard                Balance Overall balance assessment: Needs assistance Sitting-balance support: No upper extremity supported;Feet supported Sitting balance-Leahy Scale: Fair     Standing balance support: Bilateral upper extremity supported;Reliant on assistive device for balance Standing balance-Leahy Scale: Poor                             ADL either performed or assessed with clinical judgement   ADL Overall ADL's : Needs assistance/impaired Eating/Feeding: Set up;Independent;Sitting   Grooming: Wash/dry hands;Wash/dry face;Min guard;Sitting   Upper Body Bathing: Minimal assistance;Sitting   Lower Body Bathing: Maximal assistance   Upper Body Dressing : Minimal assistance;Sitting   Lower Body Dressing: Maximal assistance   Toilet Transfer: Minimal assistance;Min guard;Rolling walker (2 wheels);Stand-pivot;BSC/3in1;Cueing for safety   Toileting- Clothing Manipulation and Hygiene: Moderate assistance;Sit to/from stand       Functional mobility during ADLs: Minimal assistance;Min guard;Rolling walker (2 wheels);Cueing for safety       Vision Baseline Vision/History: 1 Wears glasses Ability to See in Adequate Light: 0 Adequate Patient  Visual Report: No change from baseline       Perception     Praxis      Pertinent Vitals/Pain Pain Assessment: No/denies pain     Hand Dominance Right    Extremity/Trunk Assessment Upper Extremity Assessment Upper Extremity Assessment: Generalized weakness;Overall WFL for tasks assessed LUE Deficits / Details: LUE grossly 3+/5, intention tremor noted, hx of CVA weakness   Lower Extremity Assessment Lower Extremity Assessment: Defer to PT evaluation   Cervical / Trunk Assessment Cervical / Trunk Assessment: Kyphotic   Communication Communication Communication: HOH   Cognition Arousal/Alertness: Awake/alert Behavior During Therapy: WFL for tasks assessed/performed Overall Cognitive Status: Within Functional Limits for tasks assessed                                       General Comments  VSS on RA, pt incontinent of urine prior to and during session    Exercises     Shoulder Instructions      Home Living Family/patient expects to be discharged to:: Private residence Living Arrangements: Alone Available Help at Discharge: Personal care attendant Type of Home: House Home Access: Ramped entrance     Home Layout: One level     Bathroom Shower/Tub: Occupational psychologist: Handicapped height Bathroom Accessibility: Yes   Home Equipment: Advice worker (2 wheels);Transport chair;BSC/3in1;Grab bars - tub/shower;Rollator (4 wheels)   Additional Comments: lives alone with PCAs 5 days/wk for 8 hrs, Sat/Sun for 4 hours      Prior Functioning/Environment Prior Level of Function : Needs assist       Physical Assist : Mobility (physical);ADLs (physical)   ADLs (physical): Bathing;Dressing;IADLs Mobility Comments: utilizes a RW for ambulation, walks to the bathroom when aides are not present ADLs Comments: Aide assists with LB bathing/dressing; cooking meals        OT Problem List: Decreased strength;Impaired balance (sitting and/or standing);Decreased activity tolerance;Decreased coordination      OT Treatment/Interventions: Self-care/ADL training;Patient/family  education;Therapeutic exercise;Balance training;Therapeutic activities;DME and/or AE instruction    OT Goals(Current goals can be found in the care plan section) Acute Rehab OT Goals Patient Stated Goal: go home OT Goal Formulation: With patient Time For Goal Achievement: 09/13/21 Potential to Achieve Goals: Good ADL Goals Pt Will Perform Grooming: with min guard assist;with supervision;with set-up;standing Pt Will Perform Upper Body Bathing: with min guard assist;with supervision;sitting Pt Will Perform Upper Body Dressing: with min guard assist;with supervision;sitting Pt Will Transfer to Toilet: with min guard assist;with supervision;ambulating Pt Will Perform Toileting - Clothing Manipulation and hygiene: with min assist;with min guard assist;sit to/from stand  OT Frequency: Min 2X/week    Co-evaluation              AM-PAC OT "6 Clicks" Daily Activity     Outcome Measure Help from another person eating meals?: None Help from another person taking care of personal grooming?: A Little Help from another person toileting, which includes using toliet, bedpan, or urinal?: A Lot Help from another person bathing (including washing, rinsing, drying)?: A Lot Help from another person to put on and taking off regular upper body clothing?: A Little Help from another person to put on and taking off regular lower body clothing?: A Lot 6 Click Score: 16   End of Session Equipment Utilized During Treatment: Gait belt;Rolling walker (2 wheels);Other (comment) (BSC)  Activity Tolerance: Patient limited by fatigue Patient left: in  chair;with call bell/phone within reach;with chair alarm set  OT Visit Diagnosis: Unsteadiness on feet (R26.81);Other abnormalities of gait and mobility (R26.89);Other symptoms and signs involving the nervous system (R29.898);Muscle weakness (generalized) (M62.81)                Time: 1643-5391 OT Time Calculation (min): 28 min Charges:  OT General Charges $OT  Visit: 1 Visit OT Evaluation $OT Eval Moderate Complexity: 1 Mod OT Treatments $Self Care/Home Management : 8-22 mins    Britt Bottom 08/30/2021, 3:45 PM

## 2021-08-30 NOTE — Evaluation (Signed)
Physical Therapy Evaluation Patient Details Name: Renee [name] MRN: [id number] DOB: 1929/04/18 Today's Date: 08/30/2021  History of Present Illness  88 y.o. female presents to [facility] hospital on 08/29/2021 with weakness. Pt found to have UTI. PMH includes T2DM, HTN, HLD, hx of CVA with residual left weakness, OSA, CKD stage IV, anxiety and depression.  Clinical Impression  Pt presents to PT with deficits in strength, power, functional mobility, and endurance. Pt is able to ambulate and transfer without physical assistance. Pt reports LE weakness compared to baseline, but seems to be fairly pleased with her performance during this session. Pt will benefit from continued aggressive mobilization to aide in improving endurance and strength. PT recommends discharge home with HHPT and continued assistance from aide.       Recommendations for follow up therapy are one component of a multi-disciplinary discharge planning process, led by the attending physician.  Recommendations may be updated based on patient status, additional functional criteria and insurance authorization.  Follow Up Recommendations Home health PT    Assistance Recommended at Discharge Intermittent Supervision/Assistance  Patient can return home with the following  A little help with walking and/or transfers;A little help with bathing/dressing/bathroom;Assistance with cooking/housework;Assist for transportation;Help with stairs or ramp for entrance    Equipment Recommendations None recommended by PT (pt owns necessary DME)  Recommendations for Other Services       Functional Status Assessment Patient has had a recent decline in their functional status and demonstrates the ability to make significant improvements in function in a reasonable and predictable amount of time.     Precautions / Restrictions Precautions Precautions: Fall Restrictions Weight Bearing Restrictions: No      Mobility  Bed Mobility Overal bed  mobility: Needs Assistance Bed Mobility: Supine to Sit     Supine to sit: Supervision;HOB elevated     General bed mobility comments: increased time    Transfers Overall transfer level: Needs assistance Equipment used: Rolling walker (2 wheels) Transfers: Sit to/from Stand Sit to Stand: Min guard                Ambulation/Gait Ambulation/Gait assistance: Min guard Gait Distance (Feet): 30 Feet (additional trial of 5') Assistive device: Rolling walker (2 wheels) Gait Pattern/deviations: Step-through pattern Gait velocity: reduced Gait velocity interpretation: <1.8 ft/sec, indicate of risk for recurrent falls   General Gait Details: pt with slowed step-through gait, increased trunk flexion  Stairs            Wheelchair Mobility    Modified Rankin (Stroke Patients Only)       Balance Overall balance assessment: Needs assistance Sitting-balance support: No upper extremity supported;Feet supported Sitting balance-Leahy Scale: Fair     Standing balance support: Bilateral upper extremity supported;Reliant on assistive device for balance Standing balance-Leahy Scale: Poor                               Pertinent Vitals/Pain Pain Assessment: No/denies pain    Home Living Family/patient expects to be discharged to:: Private residence Living Arrangements: Alone Available Help at Discharge: Personal care attendant (M-F 9-5, 9-1 on weekends) Type of Home: House Home Access: Ramped entrance       Home Layout: One level Home Equipment: Advice worker (2 wheels);Transport chair;BSC/3in1;Grab bars - tub/shower;Rollator (4 wheels)      Prior Function Prior Level of Function : Needs assist  Mobility Comments: utilizes a RW for ambulation, walks to the bathroom when aides are not present ADLs Comments: Aide assists with bathing/dressing; cooking meals     Hand Dominance   Dominant Hand: Right    Extremity/Trunk  Assessment   Upper Extremity Assessment Upper Extremity Assessment: LUE deficits/detail LUE Deficits / Details: LUE grossly 4-/5, intention tremor noted    Lower Extremity Assessment Lower Extremity Assessment: Generalized weakness    Cervical / Trunk Assessment Cervical / Trunk Assessment: Kyphotic  Communication   Communication: HOH  Cognition Arousal/Alertness: Awake/alert Behavior During Therapy: WFL for tasks assessed/performed Overall Cognitive Status: Within Functional Limits for tasks assessed                                          General Comments General comments (skin integrity, edema, etc.): VSS on RA, pt incontinent of urine prior to and during session    Exercises     Assessment/Plan    PT Assessment Patient needs continued PT services  PT Problem List Decreased strength;Decreased activity tolerance;Decreased balance;Decreased mobility       PT Treatment Interventions DME instruction;Gait training;Functional mobility training;Therapeutic activities;Therapeutic exercise;Balance training;Neuromuscular re-education;Patient/family education    PT Goals (Current goals can be found in the Care Plan section)  Acute Rehab PT Goals Patient Stated Goal: to return home PT Goal Formulation: With patient Time For Goal Achievement: 09/13/21 Potential to Achieve Goals: Good    Frequency Min 3X/week     Co-evaluation               AM-PAC PT "6 Clicks" Mobility  Outcome Measure Help needed turning from your back to your side while in a flat bed without using bedrails?: A Little Help needed moving from lying on your back to sitting on the side of a flat bed without using bedrails?: A Little Help needed moving to and from a bed to a chair (including a wheelchair)?: A Little Help needed standing up from a chair using your arms (e.g., wheelchair or bedside chair)?: A Little Help needed to walk in hospital room?: A Little Help needed climbing  3-5 steps with a railing? : A Lot 6 Click Score: 17    End of Session   Activity Tolerance: Patient tolerated treatment well Patient left: in chair;with call bell/phone within reach;with chair alarm set Nurse Communication: Mobility status PT Visit Diagnosis: Other abnormalities of gait and mobility (R26.89);Muscle weakness (generalized) (M62.81)    Time: 3009-2330 PT Time Calculation (min) (ACUTE ONLY): 28 min   Charges:   PT Evaluation $PT Eval Low Complexity: 1 Low PT Treatments $Gait Training: 8-22 mins        Zenaida Niece, PT, DPT Acute Rehabilitation Pager: [phone] Office [phone]   Zenaida Niece 08/30/2021, 1:02 PM

## 2021-08-30 NOTE — TOC Progression Note (Signed)
Transition of Care [facility]) - Progression Note    Patient Details  Name: Renee [name] MRN: [id number] Date of Birth: 1928/09/29  Transition of Care [facility]) CM/SW Contact  Jacalyn Lefevre Edson Snowball, RN Phone Number: 08/30/2021, 10:52 AM  Clinical Narrative:     Consult for home health and DME needs, await PT/OT recommendations.       Expected Discharge Plan and Services                                                 Social Determinants of Health (SDOH) Interventions    Readmission Risk Interventions Readmission Risk Prevention Plan 06/10/2020  PCP or Specialist appointment within 3-5 days of discharge Complete  Some recent data might be hidden

## 2021-08-30 NOTE — Progress Notes (Signed)
Mobility Specialist Progress Note:   08/30/21 1350  Mobility  Activity Transferred:  Chair to bed  Level of Assistance Contact guard assist, steadying assist  Assistive Device Front wheel walker  Distance Ambulated (ft) 4 ft  Mobility Response Tolerated well  Mobility performed by Mobility specialist  $Mobility charge 1 Mobility   Pt requesting to get back in bed. No physical assist required throughout session. Pt left in bed with bed alarm on.  Nelta Numbers Mobility Specialist  Phone [phone]

## 2021-08-30 NOTE — Care Management Obs Status (Signed)
[place] NOTIFICATION   Patient Details  Name: Renee [name] MRN: [id number] Date of Birth: 05-10-1929   Medicare Observation Status Notification Given:  Yes    Carles Collet, RN 08/30/2021, 2:28 PM

## 2021-08-30 NOTE — Progress Notes (Signed)
Progress Note    Renee [name]  YKZ:[id number] DOB: 10/25/1928  DOA: 08/29/2021 PCP: [name], MD    Brief Narrative:     Medical records reviewed and are as summarized below:  Renee [name] is an 88 y.o. female with medical history significant of T2DM, HTN, HLD, hx of CVA with residual left weakness, OSA, CKD stage IV, anxiety and depression who presents to ED with weakness. She states she didn't feel good last night and felt "hot." She didn't feel right. When she got up this AM she thought her feet felt weird and called EMS so she was brought to the ED. Denies any unilateral weakness, slurred speech or droopy face. She denies any confusion.   Assessment/Plan:   Principal Problem:   Weakness secondary to UTI Active Problems:   Type 2 diabetes, controlled, with retinopathy (HCC)   Essential hypertension   Hemiplegia, late effect of cerebrovascular disease (HCC)   Anxiety and depression   Sleep apnea   CKD (chronic kidney disease) stage 4, GFR 15-29 ml/min (HCC)   Normocytic anemia   UTI (urinary tract infection)   Wheezing on expiration   Weakness secondary to UTI -lives alone and felt too weak to go home from ED.  -work up unremarkable except for UA suspicious for infection -continue rocephin, culture appears to have never been sent -Pt/OT to eval/treat. She does have daily care at home.  -CT head negative for acute event -TSH recently checked and wnl  -patient bradycardic, hold home beta blocker to make sure not contributing to weakness -check orthostatics (pending from yesterday)   Wheezing on expiration- (present on admission) Patient with wheezing on exam, but denies any shortness of breath/cough No history of copd, but she tells me she had asthma as a child -will schedule duoneb and albuterol prn -continue to follow clinically  -had wheezing in November when admitted for viral infection.    Type 2 diabetes, controlled, with retinopathy ([place])-  (present on admission) a1c 8.3 on 05/2021 Hold some oral meds- add SSI and accuchecks per protocol    Essential hypertension- (present on admission) Continue home medication -hold BB due to bradycardia   Anxiety and depression- (present on admission) Stable continue zoloft, remeron and xanax prn    CKD (chronic kidney disease) stage 4, GFR 15-29 ml/min ([place])- (present on admission) At baseline (1.35-1.77), continue to monitor  -gentle IVF overnight   Hemiplegia, late effect of cerebrovascular disease ([place]) At baseline, CTH with no acute finding Continue ASA    Normocytic anemia- (present on admission) Appears at baseline, continue to monitor  -? From ckd   Sleep apnea- (present on admission) -No longer on cpap    obesity Body mass index is 32.77 kg/m.   Family Communication/Anticipated D/C date and plan/Code Status   DVT prophylaxis: Lovenox ordered. Code Status: DNR  Disposition Plan: Status is: Observation  The patient will require care spanning > 2 midnights and should be moved to inpatient because: needs IV abx x3 days        Medical Consultants:   None.   Subjective:   C/o being weak and tired  Objective:    Vitals:   08/29/21 2354 08/30/21 0401 08/30/21 0458 08/30/21 0725  BP: (!) 141/50  135/60 (!) 155/59  Pulse: 62 63 62 64  Resp:  16  16  Temp: 98.1 F (36.7 C)  (!) 97.5 F (36.4 C) (!) 97.5 F (36.4 C)  TempSrc: Oral  Oral Oral  SpO2:  98% 98% 96% 97%  Weight:      Height:        Intake/Output Summary (Last 24 hours) at 08/30/2021 1409 Last data filed at 08/29/2021 1839 Gross per 24 hour  Intake 100 ml  Output --  Net 100 ml   Filed Weights   08/29/21 1411  Weight: 83.9 kg    Exam:  General: Appearance:    Obese female in no acute distress- VERY hard of hearing     Lungs:      respirations unlabored  Heart:    Normal heart rate.   MS:   All extremities are intact.    Neurologic:   Awake, alert, hard of hearing      Data Reviewed:   I have personally reviewed following labs and imaging studies:  Labs: Labs show the following:   Basic Metabolic Panel: Recent Labs  Lab 08/29/21 1456 08/29/21 1606 08/30/21 0134  NA 139 139 137  K 4.4 4.2 4.7  CL 102 102 103  CO2 30  --  26  GLUCOSE 177* 174* 284*  BUN 19 22 21   CREATININE 1.29* 1.20* 1.55*  CALCIUM 9.0  --  8.7*   GFR Estimated Creatinine Clearance: 24.8 mL/min (A) (by C-G formula based on SCr of 1.55 mg/dL (H)). Liver Function Tests: Recent Labs  Lab 08/29/21 1456  AST 19  ALT 14  ALKPHOS 73  BILITOT 0.5  PROT 5.4*  ALBUMIN 3.2*   No results for input(s): LIPASE, AMYLASE in the last 168 hours. No results for input(s): AMMONIA in the last 168 hours. Coagulation profile Recent Labs  Lab 08/29/21 1456  INR 1.0    CBC: Recent Labs  Lab 08/29/21 1456 08/29/21 1606 08/30/21 0134  WBC 5.4  --  7.2  NEUTROABS 3.3  --   --   HGB 11.4* 12.2 10.9*  HCT 37.3 36.0 34.5*  MCV 90.3  --  89.4  PLT 174  --  170   Cardiac Enzymes: No results for input(s): CKTOTAL, CKMB, CKMBINDEX, TROPONINI in the last 168 hours. BNP (last 3 results) No results for input(s): PROBNP in the last 8760 hours. CBG: Recent Labs  Lab 08/29/21 2047 08/29/21 2358 08/30/21 0049 08/30/21 0806 08/30/21 1130  GLUCAP 154* 273* 266* 179* 159*   D-Dimer: No results for input(s): DDIMER in the last 72 hours. Hgb A1c: No results for input(s): HGBA1C in the last 72 hours. Lipid Profile: No results for input(s): CHOL, HDL, LDLCALC, TRIG, CHOLHDL, LDLDIRECT in the last 72 hours. Thyroid function studies: No results for input(s): TSH, T4TOTAL, T3FREE, THYROIDAB in the last 72 hours.  Invalid input(s): FREET3 Anemia work up: No results for input(s): VITAMINB12, FOLATE, FERRITIN, TIBC, IRON, RETICCTPCT in the last 72 hours. Sepsis Labs: Recent Labs  Lab 08/29/21 1456 08/29/21 1815 08/30/21 0134  WBC 5.4  --  7.2  LATICACIDVEN  --  0.6 1.8     Microbiology Recent Results (from the past 240 hour(s))  Culture, blood (routine x 2)     Status: None (Preliminary result)   Collection Time: 08/29/21  2:56 PM   Specimen: BLOOD  Result Value Ref Range Status   Specimen Description BLOOD RIGHT ANTECUBITAL  Final   Special Requests   Final    BOTTLES DRAWN AEROBIC AND ANAEROBIC Blood Culture adequate volume   Culture   Final    NO GROWTH < 24 HOURS Performed at [facility], [place] [address] [place], [place] [address]    Report Status  PENDING  Incomplete  Resp Panel by RT-PCR (Flu A&B, Covid) Peripheral     Status: None   Collection Time: 08/29/21  2:56 PM   Specimen: Peripheral; Nasopharyngeal(NP) swabs in vial transport medium  Result Value Ref Range Status   SARS Coronavirus 2 by RT PCR NEGATIVE NEGATIVE Final    Comment: (NOTE) SARS-CoV-2 target nucleic acids are NOT DETECTED.  The SARS-CoV-2 RNA is generally detectable in upper respiratory specimens during the acute phase of infection. The lowest concentration of SARS-CoV-2 viral copies this assay can detect is 138 copies/mL. A negative result does not preclude SARS-Cov-2 infection and should not be used as the sole basis for treatment or other patient management decisions. A negative result may occur with  improper specimen collection/handling, submission of specimen other than nasopharyngeal swab, presence of viral mutation(s) within the areas targeted by this assay, and inadequate number of viral copies(<138 copies/mL). A negative result must be combined with clinical observations, patient history, and epidemiological information. The expected result is Negative.  Fact Sheet for Patients:  EntrepreneurPulse.com.au  Fact Sheet for Healthcare Providers:  IncredibleEmployment.be  This test is no t yet approved or cleared by the [place] FDA and  has been authorized for detection and/or diagnosis of SARS-CoV-2 by FDA  under an Emergency Use Authorization (EUA). This EUA will remain  in effect (meaning this test can be used) for the duration of the COVID-19 declaration under Section 564(b)(1) of the Act, 21 U.S.C.section 360bbb-3(b)(1), unless the authorization is terminated  or revoked sooner.       Influenza A by PCR NEGATIVE NEGATIVE Final   Influenza B by PCR NEGATIVE NEGATIVE Final    Comment: (NOTE) The Xpert Xpress SARS-CoV-2/FLU/RSV plus assay is intended as an aid in the diagnosis of influenza from Nasopharyngeal swab specimens and should not be used as a sole basis for treatment. Nasal washings and aspirates are unacceptable for Xpert Xpress SARS-CoV-2/FLU/RSV testing.  Fact Sheet for Patients: EntrepreneurPulse.com.au  Fact Sheet for Healthcare Providers: IncredibleEmployment.be  This test is not yet approved or cleared by the [place] FDA and has been authorized for detection and/or diagnosis of SARS-CoV-2 by FDA under an Emergency Use Authorization (EUA). This EUA will remain in effect (meaning this test can be used) for the duration of the COVID-19 declaration under Section 564(b)(1) of the Act, 21 U.S.C. section 360bbb-3(b)(1), unless the authorization is terminated or revoked.  Performed at [facility], [place] [address], [place], [place] [address]   Culture, blood (routine x 2)     Status: None (Preliminary result)   Collection Time: 08/29/21  3:25 PM   Specimen: BLOOD  Result Value Ref Range Status   Specimen Description BLOOD LEFT ANTECUBITAL  Final   Special Requests   Final    BOTTLES DRAWN AEROBIC AND ANAEROBIC Blood Culture results may not be optimal due to an inadequate volume of blood received in culture bottles   Culture   Final    NO GROWTH < 24 HOURS Performed at [facility], [place] [address], [place], [place] [address]    Report Status PENDING  Incomplete    Procedures and diagnostic studies:  CT Head Wo  Contrast  Result Date: 08/29/2021 CLINICAL DATA:  Mental status change.  Unknown cause. EXAM: CT HEAD WITHOUT CONTRAST TECHNIQUE: Contiguous axial images were obtained from the base of the skull through the vertex without intravenous contrast. COMPARISON:  CT examination dated September 02, 2020 FINDINGS: Brain: No evidence of acute infarction,  hemorrhage, hydrocephalus, extra-axial collection or mass lesion/mass effect. Encephalomalacia of the right basal ganglia and adjacent white matter. Diffuse low-attenuation of the periventricular white matter presumed chronic microvascular ischemic changes. Vascular: No hyperdense vessel or unexpected calcification. Skull: Normal. Negative for fracture or focal lesion. Sinuses/Orbits: Cataract surgery and right scleral band. Other: None. IMPRESSION: 1. No acute intracranial abnormality. 2. Right MCA territory infarct and chronic microvascular ischemic changes of the white matter, unchanged. Electronically Signed   By: Keane Police D.O.   On: 08/29/2021 14:59   DG Chest Port 1 View  Result Date: 08/29/2021 CLINICAL DATA:  Weakness.  Slurred speech. EXAM: PORTABLE CHEST 1 VIEW COMPARISON:  One-view chest x-ray 04/23/2021 FINDINGS: Heart is enlarged. Mild pulmonary vascular congestion is present. Minimal atelectasis is present at the left base. Lungs are otherwise clear. Lung volumes are low. IMPRESSION: 1. Cardiomegaly and mild pulmonary vascular congestion. 2. Minimal atelectasis at the left base. Electronically Signed   By: San Morelle M.D.   On: 08/29/2021 15:04    Medications:    amLODipine  10 mg Oral Daily   enoxaparin (LOVENOX) injection  40 mg Subcutaneous Q24H   guaiFENesin  1,200 mg Oral BID   hydrALAZINE  100 mg Oral TID   insulin aspart  0-9 Units Subcutaneous TID WC   linagliptin  5 mg Oral Daily   mirtazapine  15 mg Oral QHS   sertraline  50 mg Oral Daily   sodium chloride flush  3 mL Intravenous Q12H   trandolapril  4 mg Oral Daily    Continuous Infusions:  sodium chloride     sodium chloride 100 mL/hr at 08/30/21 1031   cefTRIAXone (ROCEPHIN)  IV       LOS: 0 days   [name]  [facility]   How to contact the [facility] Attending or Consulting provider [place] or covering provider during after hours [place], for this patient?  Check the care team in [facility] and look for a) attending/consulting [facility] provider listed and b) the [facility] team listed Log into [email or web address] and use [place]'s universal password to access. If you do not have the password, please contact the hospital operator. Locate the [facility] provider you are looking for under [facility] and page to a number that you can be directly reached. If you still have difficulty reaching the provider, please page the [facility] (Director on Call) for the Hospitalists listed on amion for assistance.  08/30/2021, 2:09 PM

## 2021-08-31 DIAGNOSIS — R531 Weakness: Secondary | ICD-10-CM | POA: Diagnosis not present

## 2021-08-31 LAB — BASIC METABOLIC PANEL
Anion gap: 9 (ref 5–15)
BUN: 19 mg/dL (ref 8–23)
CO2: 25 mmol/L (ref 22–32)
Calcium: 8.5 mg/dL — ABNORMAL LOW (ref 8.9–10.3)
Chloride: 105 mmol/L (ref 98–111)
Creatinine, Ser: 1.23 mg/dL — ABNORMAL HIGH (ref 0.44–1.00)
GFR, Estimated: 42 mL/min — ABNORMAL LOW (ref >60)
Glucose, Bld: 180 mg/dL — ABNORMAL HIGH (ref 70–99)
Potassium: 4.6 mmol/L (ref 3.5–5.1)
Sodium: 139 mmol/L (ref 135–145)

## 2021-08-31 LAB — GLUCOSE, CAPILLARY
Glucose-Capillary: 160 mg/dL — ABNORMAL HIGH (ref 70–99)
Glucose-Capillary: 138 mg/dL — ABNORMAL HIGH (ref 70–99)
Glucose-Capillary: 230 mg/dL — ABNORMAL HIGH (ref 70–99)
Glucose-Capillary: 120 mg/dL — ABNORMAL HIGH (ref 70–99)

## 2021-08-31 NOTE — Progress Notes (Signed)
Progress Note    SHAWNESE [name]  XNA:[id number] DOB: 1928-11-28  DOA: 08/29/2021 PCP: [name], MD    Brief Narrative:     Medical records reviewed and are as summarized below:  Renee [name] is an 88 y.o. female with medical history significant of T2DM, HTN, HLD, hx of CVA with residual left weakness, OSA, CKD stage IV, anxiety and depression who presents to ED with weakness. She states she didn't feel good last night and felt "hot." She didn't feel right. When she got up this AM she thought her feet felt weird and called EMS so she was brought to the ED. Denies any unilateral weakness, slurred speech or droopy face. She denies any confusion.   Family now says that ALL of her caregivers have COVID so she would be by herself.  Assessment/Plan:   Principal Problem:   Weakness secondary to UTI Active Problems:   Type 2 diabetes, controlled, with retinopathy (HCC)   Essential hypertension   Hemiplegia, late effect of cerebrovascular disease (HCC)   Anxiety and depression   Sleep apnea   CKD (chronic kidney disease) stage 4, GFR 15-29 ml/min (HCC)   Normocytic anemia   UTI (urinary tract infection)   Wheezing on expiration   Weakness secondary to UTI -lives alone and felt too weak to go home from ED.  -work up unremarkable except for UA suspicious for infection -continue rocephin, culture appears to have never been sent- treat for 3 days -Pt/OT to eval/treat- FAMILY on 1/12 say that all of her caregivers have [place] and she has no one to care for her at home -CT head negative for acute event -TSH recently checked and wnl  -d/c BB -check orthostatics (NEVER DONE DESPITE ORDER IN CHART) and at this point is probably moot > 48 hours after admission   Wheezing on expiration- (present on admission) Patient with wheezing on exam, but denies any shortness of breath/cough No history of copd, but she tells me she had asthma as a child -will schedule duoneb and albuterol  prn -continue to follow clinically  -had wheezing in November when admitted for viral infection.    Type 2 diabetes, controlled, with retinopathy ([place])- (present on admission) a1c 8.3 on 05/2021 Hold some oral meds- add SSI and accuchecks per protocol    Essential hypertension- (present on admission) Continue home medication -hold BB due to bradycardia   Anxiety and depression- (present on admission) Stable continue zoloft, remeron and xanax prn    CKD (chronic kidney disease) stage 4, GFR 15-29 ml/min ([place])- (present on admission) At baseline (1.35-1.77), continue to monitor  -stable   Hemiplegia, late effect of cerebrovascular disease ([place]) At baseline, CTH with no acute finding Continue ASA    Normocytic anemia- (present on admission) Appears at baseline, continue to monitor  -? From ckd   Sleep apnea- (present on admission) -No longer on cpap    obesity Body mass index is 32.77 kg/m.   Family Communication/Anticipated D/C date and plan/Code Status   DVT prophylaxis: Lovenox ordered. Code Status: DNR  Disposition Plan: Status is: Observation  The patient will require care spanning > 2 midnights and should be moved to inpatient because: needs IV abx x3 days  DISCHARGE ISSUES: no caregivers at home due to all having COVID-- prob needs SNF- have RE CONSULTED PT        Medical Consultants:   None.   Subjective:   Says her caregiver is getting married in 10 days or  so  Objective:    Vitals:   08/30/21 1601 08/30/21 2108 08/31/21 0546 08/31/21 0815  BP: (!) 144/78 (!) 166/87 (!) 154/93 (!) 146/95  Pulse: 72 77 67 67  Resp: 16 17 17 17   Temp: 97.8 F (36.6 C) 98.1 F (36.7 C) 97.6 F (36.4 C) 98.6 F (37 C)  TempSrc: Oral Oral Oral Oral  SpO2: 97% 96% 94% 94%  Weight:      Height:        Intake/Output Summary (Last 24 hours) at 08/31/2021 1343 Last data filed at 08/31/2021 1914 Gross per 24 hour  Intake 1100 ml  Output 620 ml  Net 480 ml    Filed Weights   08/29/21 1411  Weight: 83.9 kg    Exam:  General: Appearance:    Obese female in no acute distress     Lungs:      respirations unlabored  Heart:    Normal heart rate.   MS:   All extremities are intact.    Neurologic:   Awake, alert, poor insight into disease processes       Data Reviewed:   I have personally reviewed following labs and imaging studies:  Labs: Labs show the following:   Basic Metabolic Panel: Recent Labs  Lab 08/29/21 1456 08/29/21 1606 08/30/21 0134 08/31/21 0224  NA 139 139 137 139  K 4.4 4.2 4.7 4.6  CL 102 102 103 105  CO2 30  --  26 25  GLUCOSE 177* 174* 284* 180*  BUN 19 22 21 19   CREATININE 1.29* 1.20* 1.55* 1.23*  CALCIUM 9.0  --  8.7* 8.5*   GFR Estimated Creatinine Clearance: 31.2 mL/min (A) (by C-G formula based on SCr of 1.23 mg/dL (H)). Liver Function Tests: Recent Labs  Lab 08/29/21 1456  AST 19  ALT 14  ALKPHOS 73  BILITOT 0.5  PROT 5.4*  ALBUMIN 3.2*   No results for input(s): LIPASE, AMYLASE in the last 168 hours. No results for input(s): AMMONIA in the last 168 hours. Coagulation profile Recent Labs  Lab 08/29/21 1456  INR 1.0    CBC: Recent Labs  Lab 08/29/21 1456 08/29/21 1606 08/30/21 0134  WBC 5.4  --  7.2  NEUTROABS 3.3  --   --   HGB 11.4* 12.2 10.9*  HCT 37.3 36.0 34.5*  MCV 90.3  --  89.4  PLT 174  --  170   Cardiac Enzymes: No results for input(s): CKTOTAL, CKMB, CKMBINDEX, TROPONINI in the last 168 hours. BNP (last 3 results) No results for input(s): PROBNP in the last 8760 hours. CBG: Recent Labs  Lab 08/30/21 1130 08/30/21 1635 08/30/21 2114 08/31/21 0813 08/31/21 1156  GLUCAP 159* 130* 183* 160* 138*   D-Dimer: No results for input(s): DDIMER in the last 72 hours. Hgb A1c: No results for input(s): HGBA1C in the last 72 hours. Lipid Profile: No results for input(s): CHOL, HDL, LDLCALC, TRIG, CHOLHDL, LDLDIRECT in the last 72 hours. Thyroid function  studies: No results for input(s): TSH, T4TOTAL, T3FREE, THYROIDAB in the last 72 hours.  Invalid input(s): FREET3 Anemia work up: No results for input(s): VITAMINB12, FOLATE, FERRITIN, TIBC, IRON, RETICCTPCT in the last 72 hours. Sepsis Labs: Recent Labs  Lab 08/29/21 1456 08/29/21 1815 08/30/21 0134  WBC 5.4  --  7.2  LATICACIDVEN  --  0.6 1.8    Microbiology Recent Results (from the past 240 hour(s))  Culture, blood (routine x 2)     Status: None (Preliminary result)  Collection Time: 08/29/21  2:56 PM   Specimen: BLOOD  Result Value Ref Range Status   Specimen Description BLOOD RIGHT ANTECUBITAL  Final   Special Requests   Final    BOTTLES DRAWN AEROBIC AND ANAEROBIC Blood Culture adequate volume   Culture   Final    NO GROWTH 2 DAYS Performed at [facility], [address], [place], [place] [address]    Report Status PENDING  Incomplete  Resp Panel by RT-PCR (Flu A&B, Covid) Peripheral     Status: None   Collection Time: 08/29/21  2:56 PM   Specimen: Peripheral; Nasopharyngeal(NP) swabs in vial transport medium  Result Value Ref Range Status   SARS Coronavirus 2 by RT PCR NEGATIVE NEGATIVE Final    Comment: (NOTE) SARS-CoV-2 target nucleic acids are NOT DETECTED.  The SARS-CoV-2 RNA is generally detectable in upper respiratory specimens during the acute phase of infection. The lowest concentration of SARS-CoV-2 viral copies this assay can detect is 138 copies/mL. A negative result does not preclude SARS-Cov-2 infection and should not be used as the sole basis for treatment or other patient management decisions. A negative result may occur with  improper specimen collection/handling, submission of specimen other than nasopharyngeal swab, presence of viral mutation(s) within the areas targeted by this assay, and inadequate number of viral copies(<138 copies/mL). A negative result must be combined with clinical observations, patient history, and  epidemiological information. The expected result is Negative.  Fact Sheet for Patients:  EntrepreneurPulse.com.au  Fact Sheet for Healthcare Providers:  IncredibleEmployment.be  This test is no t yet approved or cleared by the [place] FDA and  has been authorized for detection and/or diagnosis of SARS-CoV-2 by FDA under an Emergency Use Authorization (EUA). This EUA will remain  in effect (meaning this test can be used) for the duration of the COVID-19 declaration under Section 564(b)(1) of the Act, 21 U.S.C.section 360bbb-3(b)(1), unless the authorization is terminated  or revoked sooner.       Influenza A by PCR NEGATIVE NEGATIVE Final   Influenza B by PCR NEGATIVE NEGATIVE Final    Comment: (NOTE) The Xpert Xpress SARS-CoV-2/FLU/RSV plus assay is intended as an aid in the diagnosis of influenza from Nasopharyngeal swab specimens and should not be used as a sole basis for treatment. Nasal washings and aspirates are unacceptable for Xpert Xpress SARS-CoV-2/FLU/RSV testing.  Fact Sheet for Patients: EntrepreneurPulse.com.au  Fact Sheet for Healthcare Providers: IncredibleEmployment.be  This test is not yet approved or cleared by the [place] FDA and has been authorized for detection and/or diagnosis of SARS-CoV-2 by FDA under an Emergency Use Authorization (EUA). This EUA will remain in effect (meaning this test can be used) for the duration of the COVID-19 declaration under Section 564(b)(1) of the Act, 21 U.S.C. section 360bbb-3(b)(1), unless the authorization is terminated or revoked.  Performed at [facility], [place] [address] [place], [place] [address]   Culture, blood (routine x 2)     Status: None (Preliminary result)   Collection Time: 08/29/21  3:25 PM   Specimen: BLOOD  Result Value Ref Range Status   Specimen Description BLOOD LEFT ANTECUBITAL  Final   Special Requests    Final    BOTTLES DRAWN AEROBIC AND ANAEROBIC Blood Culture results may not be optimal due to an inadequate volume of blood received in culture bottles   Culture   Final    NO GROWTH 2 DAYS Performed at [facility], [place] [address]  [place], [place] [address]    Report Status PENDING  Incomplete    Procedures and diagnostic studies:  CT Head Wo Contrast  Result Date: 08/29/2021 CLINICAL DATA:  Mental status change.  Unknown cause. EXAM: CT HEAD WITHOUT CONTRAST TECHNIQUE: Contiguous axial images were obtained from the base of the skull through the vertex without intravenous contrast. COMPARISON:  CT examination dated September 02, 2020 FINDINGS: Brain: No evidence of acute infarction, hemorrhage, hydrocephalus, extra-axial collection or mass lesion/mass effect. Encephalomalacia of the right basal ganglia and adjacent white matter. Diffuse low-attenuation of the periventricular white matter presumed chronic microvascular ischemic changes. Vascular: No hyperdense vessel or unexpected calcification. Skull: Normal. Negative for fracture or focal lesion. Sinuses/Orbits: Cataract surgery and right scleral band. Other: None. IMPRESSION: 1. No acute intracranial abnormality. 2. Right MCA territory infarct and chronic microvascular ischemic changes of the white matter, unchanged. Electronically Signed   By: Keane Police D.O.   On: 08/29/2021 14:59   DG Chest Port 1 View  Result Date: 08/29/2021 CLINICAL DATA:  Weakness.  Slurred speech. EXAM: PORTABLE CHEST 1 VIEW COMPARISON:  One-view chest x-ray 04/23/2021 FINDINGS: Heart is enlarged. Mild pulmonary vascular congestion is present. Minimal atelectasis is present at the left base. Lungs are otherwise clear. Lung volumes are low. IMPRESSION: 1. Cardiomegaly and mild pulmonary vascular congestion. 2. Minimal atelectasis at the left base. Electronically Signed   By: San Morelle M.D.   On: 08/29/2021 15:04    Medications:    amLODipine  10 mg  Oral Daily   enoxaparin (LOVENOX) injection  30 mg Subcutaneous Q24H   guaiFENesin  1,200 mg Oral BID   hydrALAZINE  100 mg Oral TID   insulin aspart  0-9 Units Subcutaneous TID WC   linagliptin  5 mg Oral Daily   mirtazapine  15 mg Oral QHS   sertraline  50 mg Oral Daily   sodium chloride flush  3 mL Intravenous Q12H   trandolapril  4 mg Oral Daily   Continuous Infusions:  sodium chloride     cefTRIAXone (ROCEPHIN)  IV 1 g (08/30/21 1830)     LOS: 0 days   [name]  [facility]   How to contact the [facility] Attending or Consulting provider [place] or covering provider during after hours [place], for this patient?  Check the care team in [facility] and look for a) attending/consulting TRH provider listed and b) the [facility] team listed Log into [email or web address] and use [place]'s universal password to access. If you do not have the password, please contact the hospital operator. Locate the [facility] provider you are looking for under [facility] and page to a number that you can be directly reached. If you still have difficulty reaching the provider, please page the [facility] (Director on Call) for the Hospitalists listed on amion for assistance.  08/31/2021, 1:43 PM

## 2021-08-31 NOTE — Progress Notes (Signed)
Occupational Therapy Treatment Patient Details Name: Renee [name] MRN: [id number] DOB: Feb 03, 1929 Today's Date: 08/31/2021   History of present illness 88 y.o. female presents to [facility] hospital on 08/29/2021 with weakness. Pt found to have UTI. PMH includes T2DM, HTN, HLD, hx of CVA with residual left weakness, OSA, CKD stage IV, anxiety and depression.   OT comments  Patient seen this date for adjustment in disposition post acute.  The patient needs increased assist and oversight at home.  Normally she has PCA assist, but unfortunately they have all come down with Covid-19, and are forced to quarantine, and cannot provide the needed assistance at home.  The patient would benefit from short term rehab in a local SNF prior to retuning home with continued [facility] as needed.  Deficits impacting independence are listed below.  Currently she is needing up to Mod A for ADL completion, and Min A for mobility.  Prior level she could bathe herself with setup, and was Mod I with mobility in the home at [facility] level.  OT will continue to follow in the acute setting to maximize her functional status.     Recommendations for follow up therapy are one component of a multi-disciplinary discharge planning process, led by the attending physician.  Recommendations may be updated based on patient status, additional functional criteria and insurance authorization.    Follow Up Recommendations  Skilled nursing-short term rehab (<3 hours/day)    Assistance Recommended at Discharge Frequent or constant Supervision/Assistance  Patient can return home with the following  A little help with walking and/or transfers;Assistance with cooking/housework;Direct supervision/assist for financial management;Assist for transportation;Assistance with feeding;A lot of help with bathing/dressing/bathroom;Direct supervision/assist for medications management   Equipment Recommendations  None recommended by OT    Recommendations for Other  Services      Precautions / Restrictions Precautions Precautions: Fall Restrictions Weight Bearing Restrictions: No       Mobility Bed Mobility Overal bed mobility: Needs Assistance Bed Mobility: Sit to Supine       Sit to supine: Min assist   General bed mobility comments: assist with positioning hips to center of the bed, and +2 to get higher in bed    Transfers Overall transfer level: Needs assistance Equipment used: Rolling walker (2 wheels) Transfers: Sit to/from Stand Sit to Stand: Min assist           General transfer comment: minA to power up from lower commode, minG from recliner. Verbal cues for increased turnk flexion and forward lean     Balance Overall balance assessment: Needs assistance Sitting-balance support: No upper extremity supported;Feet supported Sitting balance-Leahy Scale: Good     Standing balance support: Bilateral upper extremity supported;Reliant on assistive device for balance Standing balance-Leahy Scale: Poor                             ADL either performed or assessed with clinical judgement   ADL   Eating/Feeding: Set up;Sitting   Grooming: Wash/dry hands;Wash/dry face;Min guard;Sitting   Upper Body Bathing: Minimal assistance;Sitting   Lower Body Bathing: Maximal assistance;Sit to/from stand   Upper Body Dressing : Minimal assistance;Sitting   Lower Body Dressing: Maximal assistance;Sit to/from stand   Toilet Transfer: Minimal assistance;Rolling walker (2 wheels);Cueing for safety;Ambulation;Comfort height toilet                  Extremity/Trunk Assessment Upper Extremity Assessment LUE Deficits / Details: LUE grossly 3+/5, intention tremor noted,  hx of CVA weakness   Lower Extremity Assessment Lower Extremity Assessment: Defer to PT evaluation   Cervical / Trunk Assessment Cervical / Trunk Assessment: Kyphotic    Vision Patient Visual Report: No change from baseline     Perception  Perception Perception: Not tested   Praxis Praxis Praxis: Not tested    Cognition Arousal/Alertness: Awake/alert Behavior During Therapy: WFL for tasks assessed/performed Overall Cognitive Status: Within Functional Limits for tasks assessed                                            Exercises     Shoulder Instructions       General Comments VSS on RA    Pertinent Vitals/ Pain       Pain Assessment: No/denies pain  Home Living                                          Prior Functioning/Environment              Frequency  Min 2X/week        Progress Toward Goals  OT Goals(current goals can now be found in the care plan section)  Progress towards OT goals: Progressing toward goals  Acute Rehab OT Goals Patient Stated Goal: return home OT Goal Formulation: With patient Time For Goal Achievement: 09/13/21 Potential to Achieve Goals: [place] Discharge plan needs to be updated    Co-evaluation                 AM-PAC OT "6 Clicks" Daily Activity     Outcome Measure   Help from another person eating meals?: None Help from another person taking care of personal grooming?: A Little Help from another person toileting, which includes using toliet, bedpan, or urinal?: A Lot Help from another person bathing (including washing, rinsing, drying)?: A Lot Help from another person to put on and taking off regular upper body clothing?: A Little Help from another person to put on and taking off regular lower body clothing?: A Lot 6 Click Score: 16    End of Session Equipment Utilized During Treatment: Gait belt;Rolling walker (2 wheels);Other (comment)  OT Visit Diagnosis: Unsteadiness on feet (R26.81);Other abnormalities of gait and mobility (R26.89);Other symptoms and signs involving the nervous system (R29.898);Muscle weakness (generalized) (M62.81)   Activity Tolerance Patient limited by fatigue   Patient Left in  bed;with call bell/phone within reach;with nursing/sitter in room   Nurse Communication          Time: [phone] OT Time Calculation (min): 19 min  Charges: OT General Charges $OT Visit: 1 Visit OT Treatments $Self Care/Home Management : 8-22 mins  08/31/2021  RP, OTR/L  Acute Rehabilitation Services  Office:  [phone]   Metta Clines 08/31/2021, 5:23 PM

## 2021-08-31 NOTE — Progress Notes (Signed)
Physical Therapy Treatment Patient Details Name: Renee [name] MRN: [id number] DOB: Feb 22, 1929 Today's Date: 08/31/2021   History of Present Illness 88 y.o. female presents to [facility] hospital on 08/29/2021 with weakness. Pt found to have UTI. PMH includes T2DM, HTN, HLD, hx of CVA with residual left weakness, OSA, CKD stage IV, anxiety and depression.    PT Comments    Pt limited by fatigue this session, reports sitting up in recliner for 4 hours prior to PT arrival. Pt continues to require assistance to transfer due to LE weakness. Pt is able to ambulate for short household distances at this time and demonstrates fair dynamic sitting balance during toileting task. Pt will benefit from continued acute PT services in an effort to restore activity tolerance. Per discussion with MD, the pt's family has reported no caregiver assistance is available as the pt's typical aide is sick with [place]. At baseline the pt requires assistance for  ADLs and assistance for longer bouts of ambulation. Lack of assistance during these tasks places the pt in a vulnerable position. PT updates recommendations to SNF at this time due to insufficient caregiver support.  Recommendations for follow up therapy are one component of a multi-disciplinary discharge planning process, led by the attending physician.  Recommendations may be updated based on patient status, additional functional criteria and insurance authorization.  Follow Up Recommendations  Skilled nursing-short term rehab (<3 hours/day)     Assistance Recommended at Discharge Intermittent Supervision/Assistance  Patient can return home with the following A little help with walking and/or transfers;A little help with bathing/dressing/bathroom;Assistance with cooking/housework;Assist for transportation;Help with stairs or ramp for entrance   Equipment Recommendations  None recommended by PT (pt owns necessary DME)    Recommendations for Other Services        Precautions / Restrictions Precautions Precautions: Fall Restrictions Weight Bearing Restrictions: No     Mobility  Bed Mobility Overal bed mobility: Needs Assistance Bed Mobility: Sit to Supine       Sit to supine: Supervision;HOB elevated        Transfers Overall transfer level: Needs assistance Equipment used: Rolling walker (2 wheels) Transfers: Sit to/from Stand Sit to Stand: Min assist           General transfer comment: minA to power up from lower commode, minG from recliner. Verbal cues for increased turnk flexion and forward lean    Ambulation/Gait Ambulation/Gait assistance: Min guard Gait Distance (Feet): 15 Feet (15' x 2) Assistive device: Rolling walker (2 wheels) Gait Pattern/deviations: Step-through pattern Gait velocity: reduced Gait velocity interpretation: <1.8 ft/sec, indicate of risk for recurrent falls   General Gait Details: pt with slowed step-through gait, reduced stride length   Stairs             Wheelchair Mobility    Modified Rankin (Stroke Patients Only)       Balance Overall balance assessment: Needs assistance Sitting-balance support: No upper extremity supported;Feet supported Sitting balance-Leahy Scale: Good     Standing balance support: Bilateral upper extremity supported;Reliant on assistive device for balance Standing balance-Leahy Scale: Poor                              Cognition Arousal/Alertness: Awake/alert Behavior During Therapy: WFL for tasks assessed/performed Overall Cognitive Status: Within Functional Limits for tasks assessed  Exercises      General Comments General comments (skin integrity, edema, etc.): VSS on RA      Pertinent Vitals/Pain Pain Assessment: No/denies pain    Home Living                          Prior Function            PT Goals (current goals can now be found in the care plan  section) Acute Rehab PT Goals Patient Stated Goal: to return home Progress towards PT goals: Not progressing toward goals - comment (limited by fatigue)    Frequency    Min 3X/week      PT Plan Current plan remains appropriate    Co-evaluation              AM-PAC PT "6 Clicks" Mobility   Outcome Measure  Help needed turning from your back to your side while in a flat bed without using bedrails?: A Little Help needed moving from lying on your back to sitting on the side of a flat bed without using bedrails?: A Little Help needed moving to and from a bed to a chair (including a wheelchair)?: A Little Help needed standing up from a chair using your arms (e.g., wheelchair or bedside chair)?: A Little Help needed to walk in hospital room?: A Little Help needed climbing 3-5 steps with a railing? : A Lot 6 Click Score: 17    End of Session   Activity Tolerance: Patient limited by fatigue Patient left: in bed;with call bell/phone within reach;with bed alarm set Nurse Communication: Mobility status PT Visit Diagnosis: Other abnormalities of gait and mobility (R26.89);Muscle weakness (generalized) (M62.81)     Time: [id number] PT Time Calculation (min) (ACUTE ONLY): 19 min  Charges:  $Gait Training: 8-22 mins                     Zenaida Niece, PT, DPT Acute Rehabilitation Pager: [phone] Office [place] Delayza Lungren 08/31/2021, 4:37 PM

## 2021-08-31 NOTE — Progress Notes (Signed)
Mobility Specialist Progress Note:   08/31/21 1200  Mobility  Activity Transferred:  Bed to chair  Level of Assistance Moderate assist, patient does 50-74%  Assistive Device Front wheel walker  Distance Ambulated (ft) 3 ft  Mobility Sit up in bed/chair position for meals  Mobility Response Tolerated well  Mobility performed by Mobility specialist;Nurse tech  Bed Position Chair  $Mobility charge 1 Mobility   Pt displayed increased generalized weakness today. Required modA to stand from EOB, minA during pivot transfer. Pt left in chair with chair alarm on.   Nelta Numbers Mobility Specialist  Phone [phone]

## 2021-09-01 DIAGNOSIS — R001 Bradycardia, unspecified: Secondary | ICD-10-CM | POA: Diagnosis present

## 2021-09-01 DIAGNOSIS — N390 Urinary tract infection, site not specified: Secondary | ICD-10-CM | POA: Diagnosis not present

## 2021-09-01 DIAGNOSIS — D649 Anemia, unspecified: Secondary | ICD-10-CM | POA: Diagnosis not present

## 2021-09-01 DIAGNOSIS — R531 Weakness: Secondary | ICD-10-CM | POA: Diagnosis not present

## 2021-09-01 DIAGNOSIS — E669 Obesity, unspecified: Secondary | ICD-10-CM | POA: Diagnosis not present

## 2021-09-01 DIAGNOSIS — J45909 Unspecified asthma, uncomplicated: Secondary | ICD-10-CM | POA: Diagnosis present

## 2021-09-01 DIAGNOSIS — I69354 Hemiplegia and hemiparesis following cerebral infarction affecting left non-dominant side: Secondary | ICD-10-CM | POA: Diagnosis not present

## 2021-09-01 DIAGNOSIS — Z7982 Long term (current) use of aspirin: Secondary | ICD-10-CM | POA: Diagnosis not present

## 2021-09-01 DIAGNOSIS — N184 Chronic kidney disease, stage 4 (severe): Secondary | ICD-10-CM | POA: Diagnosis not present

## 2021-09-01 DIAGNOSIS — Z6832 Body mass index (BMI) 32.0-32.9, adult: Secondary | ICD-10-CM | POA: Diagnosis not present

## 2021-09-01 DIAGNOSIS — M1990 Unspecified osteoarthritis, unspecified site: Secondary | ICD-10-CM | POA: Diagnosis present

## 2021-09-01 DIAGNOSIS — Z90710 Acquired absence of both cervix and uterus: Secondary | ICD-10-CM | POA: Diagnosis not present

## 2021-09-01 DIAGNOSIS — Z79890 Hormone replacement therapy (postmenopausal): Secondary | ICD-10-CM | POA: Diagnosis not present

## 2021-09-01 DIAGNOSIS — Z7984 Long term (current) use of oral hypoglycemic drugs: Secondary | ICD-10-CM | POA: Diagnosis not present

## 2021-09-01 DIAGNOSIS — Z66 Do not resuscitate: Secondary | ICD-10-CM | POA: Diagnosis not present

## 2021-09-01 DIAGNOSIS — Z20822 Contact with and (suspected) exposure to covid-19: Secondary | ICD-10-CM | POA: Diagnosis not present

## 2021-09-01 DIAGNOSIS — Z8701 Personal history of pneumonia (recurrent): Secondary | ICD-10-CM | POA: Diagnosis not present

## 2021-09-01 DIAGNOSIS — E1122 Type 2 diabetes mellitus with diabetic chronic kidney disease: Secondary | ICD-10-CM | POA: Diagnosis not present

## 2021-09-01 DIAGNOSIS — G4733 Obstructive sleep apnea (adult) (pediatric): Secondary | ICD-10-CM | POA: Diagnosis present

## 2021-09-01 DIAGNOSIS — E785 Hyperlipidemia, unspecified: Secondary | ICD-10-CM | POA: Diagnosis not present

## 2021-09-01 DIAGNOSIS — Z79899 Other long term (current) drug therapy: Secondary | ICD-10-CM | POA: Diagnosis not present

## 2021-09-01 DIAGNOSIS — F32A Depression, unspecified: Secondary | ICD-10-CM | POA: Diagnosis present

## 2021-09-01 DIAGNOSIS — M8580 Other specified disorders of bone density and structure, unspecified site: Secondary | ICD-10-CM | POA: Diagnosis present

## 2021-09-01 DIAGNOSIS — I129 Hypertensive chronic kidney disease with stage 1 through stage 4 chronic kidney disease, or unspecified chronic kidney disease: Secondary | ICD-10-CM | POA: Diagnosis present

## 2021-09-01 DIAGNOSIS — F419 Anxiety disorder, unspecified: Secondary | ICD-10-CM | POA: Diagnosis present

## 2021-09-01 LAB — RESP PANEL BY RT-PCR (FLU A&B, COVID) ARPGX2
Influenza A by PCR: NEGATIVE
Influenza B by PCR: NEGATIVE
SARS Coronavirus 2 by RT PCR: NEGATIVE

## 2021-09-01 LAB — GLUCOSE, CAPILLARY
Glucose-Capillary: 156 mg/dL — ABNORMAL HIGH (ref 70–99)
Glucose-Capillary: 178 mg/dL — ABNORMAL HIGH (ref 70–99)
Glucose-Capillary: 162 mg/dL — ABNORMAL HIGH (ref 70–99)
Glucose-Capillary: 193 mg/dL — ABNORMAL HIGH (ref 70–99)

## 2021-09-01 MED ORDER — HYDRALAZINE HCL 20 MG/ML IJ SOLN
5.0000 mg | Freq: Once | INTRAMUSCULAR | Status: AC
  Administered 2021-09-01: 5 mg via INTRAVENOUS
  Filled 2021-09-01: qty 1

## 2021-09-01 MED ORDER — ALPRAZOLAM 0.5 MG PO TABS: 0.5000 mg | ORAL_TABLET | Freq: Two times a day (BID) | ORAL | 0 refills | Status: DC

## 2021-09-01 NOTE — Progress Notes (Signed)
Mobility Specialist Progress Note:   09/01/21 1020  Mobility  Activity Ambulated in hall  Level of Assistance Contact guard assist, steadying assist  Assistive Device Front wheel walker  Distance Ambulated (ft) 120 ft  Mobility Sit up in bed/chair position for meals;Ambulated with assistance in hallway  Mobility Response Tolerated well  Mobility performed by Mobility specialist  Bed Position Chair  $Mobility charge 1 Mobility   Pt eager for OOB mobility. Required contact guard throughout session. Pt left up in chair with chair alarm on.   Nelta Numbers Mobility Specialist  Phone [phone]

## 2021-09-01 NOTE — TOC Transition Note (Signed)
Transition of Care [facility]) - CM/SW Discharge Note   Patient Details  Name: Renee [name] MRN: [id number] Date of Birth: 24-Nov-1928  [facility] CM/SW Contact:  Emeterio Reeve, LCSW Phone Number: 09/01/2021, 2:25 PM   Clinical Narrative:    CSW spoke with Pt at bedside to discuss SNF. Pt is agreeable. CSW gave bed offers. [facility] can accept pt today.   Per MD patient ready for DC to [facility]. RN, patient, patient's family, and facility notified of DC. Discharge Summary and FL2 sent to facility. DC packet on chart. [employer] Josem Kaufmann is waived and pt is covid negative. Ambulance transport requested for patient.    RN to call report to [phone].  CSW will sign off for now as social work intervention is no longer needed. Please consult [place] again if new needs arise.   Final next level of care: Skilled Nursing Facility Barriers to Discharge: Barriers Resolved   Patient Goals and CMS Choice Patient states their goals for this hospitalization and ongoing recovery are:: to return to home CMS Medicare.gov Compare Post Acute Care list provided to:: Patient Choice offered to / list presented to : Patient  Discharge Placement              Patient chooses bed at: Other - please specify in the comment section below: Patient to be transferred to facility by: ptar Name of family member notified: Pts POA [facility] [place] Patient and family notified of of transfer: 09/01/21  Discharge Plan and Services   Discharge Planning Services: CM Consult Post Acute Care Choice: Home Health          DME Arranged: N/A         HH Arranged: PT, OT HH Agency: [place] Date [place]: 08/30/21 Time [place]: 1520 Representative spoke with at [place]: Levada Dy left message  Social Determinants of Health (SDOH) Interventions     Readmission Risk Interventions Readmission Risk Prevention Plan 06/10/2020  PCP or Specialist appointment within  3-5 days of discharge Complete  Some recent data might be hidden    Emeterio Reeve, LCSW Clinical Social Worker

## 2021-09-01 NOTE — [place] FL2 (Signed)
[place] MEDICAID FL2 LEVEL OF CARE SCREENING TOOL     IDENTIFICATION  Patient Name: Renee [name] Birthdate: September 21, 1928 Sex: female Admission Date (Current Location): 08/29/2021  [facility] and [place] Number:  Herbalist and Address:  [facility] [place], [place] [address] [place], [place] [address]      Provider Number: [id number]  Attending Physician Name and Address:  [name], DO  Relative Name and Phone Number:       Current Level of Care: Hospital Recommended Level of Care: [place] Prior Approval Number:    Date Approved/Denied:   PASRR Number: [id number]  Discharge Plan: SNF    Current Diagnoses: Patient Active Problem List   Diagnosis Date Noted   Bradycardia 09/01/2021   UTI (urinary tract infection) 08/29/2021   Wheezing on expiration 08/29/2021   Pleural effusion 05/13/2020   Frequent UTI 05/13/2020   Dizziness 02/12/2020   Normocytic anemia 02/12/2020   Fall involving sidewalk curb 11/29/2019   Contusion of back 11/27/2019   Aortic atherosclerosis ([place]) 07/13/2019   CKD (chronic kidney disease) stage 4, GFR 15-29 ml/min ([place]) 07/01/2019   External hemorrhoid 01/17/2018   History of colitis 01/17/2018   Generalized weakness 12/24/2016   Diabetic retinopathy ([place]) 12/11/2016   History of CVA (cerebrovascular accident) 12/11/2016   Weakness secondary to UTI 10/21/2015   Estrogen deficiency 08/30/2015   Mobility impaired 06/26/2011   History of retinal detachment 01/10/2011   Sleep apnea 11/28/2010   Anxiety and depression 08/25/2010   Hemiplegia, late effect of cerebrovascular disease ([place]) 07/05/2010   POSTHERPETIC NEURALGIA 11/09/2009   Renal insufficiency 06/29/2008   Chronic back pain 01/26/2008   EDEMA 01/26/2008   B12 deficiency 01/10/2007   Type 2 diabetes, controlled, with retinopathy ([place]) 11/27/2006   Essential hypertension 11/27/2006   FIBROCYSTIC BREAST DISEASE 11/27/2006   ROSACEA  11/27/2006   OSTEOARTHRITIS 11/27/2006   URINARY INCONTINENCE, MIXED 11/27/2006    Orientation RESPIRATION BLADDER Height & Weight     Self, Time, Situation, Place  Normal Incontinent Weight: 185 lb (83.9 kg) Height:  5\' 3"  (160 cm)  BEHAVIORAL SYMPTOMS/MOOD NEUROLOGICAL BOWEL NUTRITION STATUS      Incontinent    AMBULATORY STATUS COMMUNICATION OF NEEDS Skin   Limited Assist Verbally                         Personal Care Assistance Level of Assistance  Feeding, Bathing, Dressing Bathing Assistance: Limited assistance Feeding assistance: Limited assistance Dressing Assistance: Limited assistance     Functional Limitations Info  Sight, Hearing, Speech Sight Info: Adequate Hearing Info: Impaired (Hard of Hearig, has hearing aids) Speech Info: Adequate    SPECIAL CARE FACTORS FREQUENCY  PT (By licensed PT), OT (By licensed OT)     PT Frequency: 5x a week OT Frequency: 5x a week            Contractures Contractures Info: Not present    Additional Factors Info  Code Status, Allergies Code Status Info: DNR Allergies Info: Tape   Diclofenac Sodium   Pioglitazone           Current Medications (09/01/2021):  This is the current hospital active medication list Current Facility-Administered Medications  Medication Dose Route Frequency Provider Last Rate Last Admin   0.9 %  sodium chloride infusion  250 mL Intravenous PRN Orma Flaming, MD       acetaminophen (TYLENOL) tablet 650 mg  650 mg Oral Q6H  PRN Orma Flaming, MD       Or   acetaminophen (TYLENOL) suppository 650 mg  650 mg Rectal Q6H PRN Orma Flaming, MD       albuterol (PROVENTIL) (2.5 MG/3ML) 0.083% nebulizer solution 3 mL  3 mL Inhalation Q4H PRN Orma Flaming, MD       ALPRAZolam [name]) tablet 0.5 mg  0.5 mg Oral BID PRN Orma Flaming, MD   0.5 mg at 08/31/21 2229   amLODipine (NORVASC) tablet 10 mg  10 mg Oral Daily Orma Flaming, MD   10 mg at 09/01/21 0953   enoxaparin (LOVENOX) injection 30  mg  30 mg Subcutaneous Q24H Pham, Minh Q, RPH-CPP   30 mg at 08/31/21 2228   fluticasone (FLONASE) 50 MCG/ACT nasal spray 2 spray  2 spray Each Nare QHS PRN [name], DO       guaiFENesin (MUCINEX) 12 hr tablet 1,200 mg  1,200 mg Oral BID Vann, Jessica U, DO   1,200 mg at 09/01/21 2992   hydrALAZINE (APRESOLINE) tablet 100 mg  100 mg Oral TID Orma Flaming, MD   100 mg at 09/01/21 0953   insulin aspart (novoLOG) injection 0-9 Units  0-9 Units Subcutaneous TID WC Orma Flaming, MD   2 Units at 09/01/21 1241   linagliptin (TRADJENTA) tablet 5 mg  5 mg Oral Daily [name] U, DO   5 mg at 09/01/21 4268   mirtazapine (REMERON) tablet 15 mg  15 mg Oral QHS Orma Flaming, MD   15 mg at 08/31/21 2228   olopatadine (PATANOL) 0.1 % ophthalmic solution 1 drop  1 drop Both Eyes BID PRN [name] U, DO       sertraline (ZOLOFT) tablet 50 mg  50 mg Oral Daily Orma Flaming, MD   50 mg at 09/01/21 0953   sodium chloride flush (NS) 0.9 % injection 3 mL  3 mL Intravenous Q12H Orma Flaming, MD   3 mL at 09/01/21 0953   sodium chloride flush (NS) 0.9 % injection 3 mL  3 mL Intravenous PRN Orma Flaming, MD       trandolapril (MAVIK) tablet 4 mg  4 mg Oral Daily Orma Flaming, MD   4 mg at 09/01/21 3419     Discharge Medications: Please see discharge summary for a list of discharge medications.  Relevant Imaging Results:  Relevant Lab Results:   Additional Information SSN [id number]  Emeterio Reeve, [place]

## 2021-09-01 NOTE — Discharge Summary (Signed)
Physician Discharge Summary  Renee [name] VOJ:[id number] DOB: 1928-11-13 DOA: 08/29/2021  PCP: [name], MD  Admit date: 08/29/2021 Discharge date: 09/01/2021  Admitted From: home Discharge disposition: SNF- short term   Recommendations for Outpatient Follow-Up:   Adjust BP meds as needed-- would avoid BB due to bradycardia BMP 1 week   Discharge Diagnosis:   Principal Problem:   Weakness secondary to UTI Active Problems:   Type 2 diabetes, controlled, with retinopathy ([place])   Essential hypertension   Hemiplegia, late effect of cerebrovascular disease ([place])   Anxiety and depression   Sleep apnea   CKD (chronic kidney disease) stage 4, GFR 15-29 ml/min (HCC)   Normocytic anemia   UTI (urinary tract infection)   Wheezing on expiration   Bradycardia    Discharge Condition: Improved.  Diet recommendation: Low sodium, heart healthy.  Carbohydrate-modified.   Wound care: None.  Code status: DNR   History of Present Illness:   Renee [name] is a 88 y.o. female with medical history significant of T2DM, HTN, HLD, hx of CVA with residual left weakness, OSA, CKD stage IV, anxiety and depression who presents to ED with weakness. She states she didn't feel good last night and felt "hot." She didn't feel right. When she got up this AM she thought her feet felt weird and called EMS so she was brought to the ED. Denies any unilateral weakness, slurred speech or droopy face. She denies any confusion.    She denies any fever/chills, no headache or vision changes, no chest pain or palpitations, no shortness of breath, occasional cough, no stomach pain, no N/V/D. She has no dysuria, but has increased frequency and urgency. No blood in her urine and no CVA tenderness. She has mild leg swelling, at baseline. She has been eating and drinking well.    She needs assistance with ADLs. She does not drive.    Hospital Course by Problem:   Weakness secondary to UTI -work  up unremarkable except for UA suspicious for infection -rocephin, culture appears to have never been sent- treated for 3 days -CT head negative for acute event -TSH recently checked and wnl  -d/c BB -home caregivers with COVID- plan to d/c home once caregiver recovered   Wheezing on expiration- (present on admission) Patient with wheezing on exam, but denies any shortness of breath/cough No history of copd, but she tells me she had asthma as a child -albuterol prn -continue to follow clinically  -had wheezing in November when admitted for viral infection.    Type 2 diabetes, controlled, with retinopathy ([place])- (present on admission) a1c 8.3 on 05/2021 -d/c actos   Essential hypertension- (present on admission) Continue home medication- adjust as needed -hold BB due to bradycardia   Anxiety and depression- (present on admission) Stable continue zoloft, remeron and xanax prn    CKD (chronic kidney disease) stage 4, GFR 15-29 ml/min (HCC)- (present on admission) At baseline (1.35-1.77), continue to monitor  -stable   Hemiplegia, late effect of cerebrovascular disease ([place]) At baseline, CTH with no acute finding Continue ASA    Normocytic anemia- (present on admission) Appears at baseline, continue to monitor  -? From ckd   Sleep apnea- (present on admission) -No longer on cpap    obesity Body mass index is 32.77 kg/m.    Medical Consultants:      Discharge Exam:   Vitals:   09/01/21 0648 09/01/21 0830  BP: (!) 158/60 (!) 173/60  Pulse:  65 66  Resp: 18 20  Temp: 98.3 F (36.8 C) 97.6 F (36.4 C)  SpO2: 97% 95%   Vitals:   08/31/21 1748 08/31/21 2225 09/01/21 0648 09/01/21 0830  BP: (!) 132/91 (!) 155/55 (!) 158/60 (!) 173/60  Pulse:  80 65 66  Resp:  _0 Temp:  98.3 F (36.8 C) 98.3 F (36.8 C) 97.6 F (36.4 C)  TempSrc:  Oral Oral Oral  SpO2:  94% 97% 95%  Weight:      Height:        General exam: Appears calm and comfortable.      The results of significant diagnostics from this hospitalization (including imaging, microbiology, ancillary and laboratory) are listed below for reference.     Procedures and Diagnostic Studies:   CT Head Wo Contrast  Result Date: 08/29/2021 CLINICAL DATA:  Mental status change.  Unknown cause. EXAM: CT HEAD WITHOUT CONTRAST TECHNIQUE: Contiguous axial images were obtained from the base of the skull through the vertex without intravenous contrast. COMPARISON:  CT examination dated September 02, 2020 FINDINGS: Brain: No evidence of acute infarction, hemorrhage, hydrocephalus, extra-axial collection or mass lesion/mass effect. Encephalomalacia of the right basal ganglia and adjacent white matter. Diffuse low-attenuation of the periventricular white matter presumed chronic microvascular ischemic changes. Vascular: No hyperdense vessel or unexpected calcification. Skull: Normal. Negative for fracture or focal lesion. Sinuses/Orbits: Cataract surgery and right scleral band. Other: None. IMPRESSION: 1. No acute intracranial abnormality. 2. Right MCA territory infarct and chronic microvascular ischemic changes of the white matter, unchanged. Electronically Signed   By: Keane Police D.O.   On: 08/29/2021 14:59   DG Chest Port 1 View  Result Date: 08/29/2021 CLINICAL DATA:  Weakness.  Slurred speech. EXAM: PORTABLE CHEST 1 VIEW COMPARISON:  One-view chest x-ray 04/23/2021 FINDINGS: Heart is enlarged. Mild pulmonary vascular congestion is present. Minimal atelectasis is present at the left base. Lungs are otherwise clear. Lung volumes are low. IMPRESSION: 1. Cardiomegaly and mild pulmonary vascular congestion. 2. Minimal atelectasis at the left base. Electronically Signed   By: San Morelle M.D.   On: 08/29/2021 15:04     Labs:   Basic Metabolic Panel: Recent Labs  Lab 08/29/21 1456 08/29/21 1606 08/30/21 0134 08/31/21 0224  NA 139 139 137 139  K 4.4 4.2 4.7 4.6  CL 102 102 103 105   CO2 30  --  26 25  GLUCOSE 177* 174* 284* 180*  BUN _1 CREATININE 1.29* 1.20* 1.55* 1.23*  CALCIUM 9.0  --  8.7* 8.5*   GFR Estimated Creatinine Clearance: 31.2 mL/min (A) (by C-G formula based on SCr of 1.23 mg/dL (H)). Liver Function Tests: Recent Labs  Lab 08/29/21 1456  AST 19  ALT 14  ALKPHOS 73  BILITOT 0.5  PROT 5.4*  ALBUMIN 3.2*   No results for input(s): LIPASE, AMYLASE in the last 168 hours. No results for input(s): AMMONIA in the last 168 hours. Coagulation profile Recent Labs  Lab 08/29/21 1456  INR 1.0    CBC: Recent Labs  Lab 08/29/21 1456 08/29/21 1606 08/30/21 0134  WBC 5.4  --  7.2  NEUTROABS 3.3  --   --   HGB 11.4* 12.2 10.9*  HCT 37.3 36.0 34.5*  MCV 90.3  --  89.4  PLT 174  --  170   Cardiac Enzymes: No results for input(s): CKTOTAL, CKMB, CKMBINDEX, TROPONINI in the last 168 hours. BNP: Invalid input(s): POCBNP CBG: Recent Labs  Lab  08/31/21 1156 08/31/21 1716 08/31/21 2101 09/01/21 0825 09/01/21 1207  GLUCAP 138* 230* 120* 156* 178*   D-Dimer No results for input(s): DDIMER in the last 72 hours. Hgb A1c No results for input(s): HGBA1C in the last 72 hours. Lipid Profile No results for input(s): CHOL, HDL, LDLCALC, TRIG, CHOLHDL, LDLDIRECT in the last 72 hours. Thyroid function studies No results for input(s): TSH, T4TOTAL, T3FREE, THYROIDAB in the last 72 hours.  Invalid input(s): FREET3 Anemia work up No results for input(s): VITAMINB12, FOLATE, FERRITIN, TIBC, IRON, RETICCTPCT in the last 72 hours. Microbiology Recent Results (from the past 240 hour(s))  Culture, blood (routine x 2)     Status: None (Preliminary result)   Collection Time: 08/29/21  2:56 PM   Specimen: BLOOD  Result Value Ref Range Status   Specimen Description BLOOD RIGHT ANTECUBITAL  Final   Special Requests   Final    BOTTLES DRAWN AEROBIC AND ANAEROBIC Blood Culture adequate volume   Culture   Final    NO GROWTH 3 DAYS Performed at  [facility], [address], [place], [place] [address]    Report Status PENDING  Incomplete  Resp Panel by RT-PCR (Flu A&B, Covid) Peripheral     Status: None   Collection Time: 08/29/21  2:56 PM   Specimen: Peripheral; Nasopharyngeal(NP) swabs in vial transport medium  Result Value Ref Range Status   SARS Coronavirus 2 by RT PCR NEGATIVE NEGATIVE Final    Comment: (NOTE) SARS-CoV-2 target nucleic acids are NOT DETECTED.  The SARS-CoV-2 RNA is generally detectable in upper respiratory specimens during the acute phase of infection. The lowest concentration of SARS-CoV-2 viral copies this assay can detect is 138 copies/mL. A negative result does not preclude SARS-Cov-2 infection and should not be used as the sole basis for treatment or other patient management decisions. A negative result may occur with  improper specimen collection/handling, submission of specimen other than nasopharyngeal swab, presence of viral mutation(s) within the areas targeted by this assay, and inadequate number of viral copies(<138 copies/mL). A negative result must be combined with clinical observations, patient history, and epidemiological information. The expected result is Negative.  Fact Sheet for Patients:  EntrepreneurPulse.com.au  Fact Sheet for Healthcare Providers:  IncredibleEmployment.be  This test is no t yet approved or cleared by the [place] FDA and  has been authorized for detection and/or diagnosis of SARS-CoV-2 by FDA under an Emergency Use Authorization (EUA). This EUA will remain  in effect (meaning this test can be used) for the duration of the COVID-19 declaration under Section 564(b)(1) of the Act, 21 U.S.C.section 360bbb-3(b)(1), unless the authorization is terminated  or revoked sooner.       Influenza A by PCR NEGATIVE NEGATIVE Final   Influenza B by PCR NEGATIVE NEGATIVE Final    Comment: (NOTE) The Xpert Xpress  SARS-CoV-2/FLU/RSV plus assay is intended as an aid in the diagnosis of influenza from Nasopharyngeal swab specimens and should not be used as a sole basis for treatment. Nasal washings and aspirates are unacceptable for Xpert Xpress SARS-CoV-2/FLU/RSV testing.  Fact Sheet for Patients: EntrepreneurPulse.com.au  Fact Sheet for Healthcare Providers: IncredibleEmployment.be  This test is not yet approved or cleared by the [place] FDA and has been authorized for detection and/or diagnosis of SARS-CoV-2 by FDA under an Emergency Use Authorization (EUA). This EUA will remain in effect (meaning this test can be used) for the duration of the COVID-19 declaration under Section 564(b)(1) of the Act, 21 U.S.C.  section 360bbb-3(b)(1), unless the authorization is terminated or revoked.  Performed at [facility], [place] [address] [place], [place] [address]   Culture, blood (routine x 2)     Status: None (Preliminary result)   Collection Time: 08/29/21  3:25 PM   Specimen: BLOOD  Result Value Ref Range Status   Specimen Description BLOOD LEFT ANTECUBITAL  Final   Special Requests   Final    BOTTLES DRAWN AEROBIC AND ANAEROBIC Blood Culture results may not be optimal due to an inadequate volume of blood received in culture bottles   Culture   Final    NO GROWTH 3 DAYS Performed at [facility], [place] [address], [place], [place] [address]    Report Status PENDING  Incomplete  Resp Panel by RT-PCR (Flu A&B, Covid) Nasopharyngeal Swab     Status: None   Collection Time: 09/01/21  9:46 AM   Specimen: Nasopharyngeal Swab; Nasopharyngeal(NP) swabs in vial transport medium  Result Value Ref Range Status   SARS Coronavirus 2 by RT PCR NEGATIVE NEGATIVE Final    Comment: (NOTE) SARS-CoV-2 target nucleic acids are NOT DETECTED.  The SARS-CoV-2 RNA is generally detectable in upper respiratory specimens during the acute phase of infection. The  lowest concentration of SARS-CoV-2 viral copies this assay can detect is 138 copies/mL. A negative result does not preclude SARS-Cov-2 infection and should not be used as the sole basis for treatment or other patient management decisions. A negative result may occur with  improper specimen collection/handling, submission of specimen other than nasopharyngeal swab, presence of viral mutation(s) within the areas targeted by this assay, and inadequate number of viral copies(<138 copies/mL). A negative result must be combined with clinical observations, patient history, and epidemiological information. The expected result is Negative.  Fact Sheet for Patients:  EntrepreneurPulse.com.au  Fact Sheet for Healthcare Providers:  IncredibleEmployment.be  This test is no t yet approved or cleared by the [place] FDA and  has been authorized for detection and/or diagnosis of SARS-CoV-2 by FDA under an Emergency Use Authorization (EUA). This EUA will remain  in effect (meaning this test can be used) for the duration of the COVID-19 declaration under Section 564(b)(1) of the Act, 21 U.S.C.section 360bbb-3(b)(1), unless the authorization is terminated  or revoked sooner.       Influenza A by PCR NEGATIVE NEGATIVE Final   Influenza B by PCR NEGATIVE NEGATIVE Final    Comment: (NOTE) The Xpert Xpress SARS-CoV-2/FLU/RSV plus assay is intended as an aid in the diagnosis of influenza from Nasopharyngeal swab specimens and should not be used as a sole basis for treatment. Nasal washings and aspirates are unacceptable for Xpert Xpress SARS-CoV-2/FLU/RSV testing.  Fact Sheet for Patients: EntrepreneurPulse.com.au  Fact Sheet for Healthcare Providers: IncredibleEmployment.be  This test is not yet approved or cleared by the [place] FDA and has been authorized for detection and/or diagnosis of SARS-CoV-2 by FDA under  an Emergency Use Authorization (EUA). This EUA will remain in effect (meaning this test can be used) for the duration of the COVID-19 declaration under Section 564(b)(1) of the Act, 21 U.S.C. section 360bbb-3(b)(1), unless the authorization is terminated or revoked.  Performed at [facility], [place] [address], [place], [place] [address]      Discharge Instructions:   Discharge Instructions     Diet - low sodium heart healthy   Complete by: As directed    Diet Carb Modified   Complete by: As directed  Increase activity slowly   Complete by: As directed       Allergies as of 09/01/2021       Reactions   Tape Other (See Comments)   The patient's skin is VERY THIN- will TEAR EASILY!!   Diclofenac Sodium Swelling, Other (See Comments)   Angioedema   Pioglitazone Swelling, Other (See Comments)   Re-started in 2022        Medication List     STOP taking these medications    metoprolol succinate 25 MG 24 hr tablet Commonly known as: TOPROL-XL   pioglitazone 15 MG tablet Commonly known as: Actos   predniSONE 10 MG tablet Commonly known as: DELTASONE       TAKE these medications    acetaminophen 500 MG tablet Commonly known as: TYLENOL Take 1,000 mg by mouth at bedtime.   albuterol 108 (90 Base) MCG/ACT inhaler Commonly known as: VENTOLIN HFA INHALE 2 PUFFS INTO THE LUNGS EVERY 4 HOURS AS NEEDED FOR WHEEZING/SHORTNESS OF BREATH What changed: See the new instructions.   ALPRAZolam 0.5 MG tablet Commonly known as: XANAX Take 1 tablet (0.5 mg total) by mouth 2 (two) times daily. TAKE 1/2 TO 1 TABLET BY MOUTH TWICE A DAY AS NEEDED FOR ANXIETY Strength: 0.5 mg   amLODipine 10 MG tablet Commonly known as: NORVASC Take 1 tablet (10 mg total) by mouth daily.   aspirin EC 325 MG tablet Take 325 mg by mouth in the morning.   calcium-vitamin D 500-200 MG-UNIT tablet Commonly known as: OSCAL WITH D Take 1 tablet by mouth daily.   cephALEXin 500 MG  capsule Commonly known as: KEFLEX Take 500 mg by mouth daily.   Cranberry 500 MG Tabs Take 500 mg by mouth in the morning and at bedtime.   docusate sodium 100 MG capsule Commonly known as: COLACE Take 200 mg by mouth at bedtime.   estradiol 0.1 MG/GM vaginal cream Commonly known as: ESTRACE PLEASE SEE ATTACHED FOR DETAILED DIRECTIONS   Flonase 50 MCG/ACT nasal spray Generic drug: fluticasone Place 2 sprays into both nostrils See admin instructions. Instill 2 sprays into each nostril at bedtime when not using Nasacort   glipiZIDE 10 MG 24 hr tablet Commonly known as: glipiZIDE XL Take 1 tablet (10 mg total) by mouth daily with breakfast.   guaiFENesin 600 MG 12 hr tablet Commonly known as: MUCINEX Take 2 tablets (1,200 mg total) by mouth 2 (two) times daily.   hydrALAZINE 100 MG tablet Commonly known as: APRESOLINE Take 1 tablet (100 mg total) by mouth 3 (three) times daily.   mirtazapine 15 MG tablet Commonly known as: REMERON Take 1 tablet (15 mg total) by mouth at bedtime.   multivitamin capsule Take 1 capsule by mouth daily.   ONE TOUCH ULTRA 2 w/Device Kit Check blood sugar once daily and as directed. Dx V78.5   OneTouch Delica Plus [id number] Misc CHECK BLOOD SUGAR ONCE DAILY AND AS DIRECTED   OneTouch Ultra test strip Generic drug: glucose blood CHECK BLOOD SUGAR ONCE DAILY AND AS DIRECTED. DX E11.9   PATADAY OP Place 1 drop into both eyes 2 (two) times daily as needed (for itching or redness).   PreserVision AREDS 2+Multi Vit Caps Take 1 capsule by mouth in the morning and at bedtime.   sertraline 50 MG tablet Commonly known as: ZOLOFT TAKE 1 TABLET BY MOUTH EVERY DAY   sitaGLIPtin 100 MG tablet Commonly known as: Januvia Take 1 tablet (100 mg total) by mouth daily.   trandolapril  4 MG tablet Commonly known as: MAVIK Take 1 tablet (4 mg total) by mouth daily.   triamcinolone 55 MCG/ACT Aero nasal inhaler Commonly known as: NASACORT Place 2  sprays into the nose daily. What changed:  when to take this additional instructions   vitamin B-12 1000 MCG tablet Commonly known as: CYANOCOBALAMIN Take 1,000 mcg by mouth daily.   Vitamin D-3 25 MCG (1000 UT) Caps Take 1,000 Units by mouth in the morning.        Follow-up Information     [place] [place] Follow up.   Specialty: Home Health Services Contact information: [place] [id number] [phone]                  Time coordinating discharge: 35 min  Signed:  [name] DO  [facility] 09/01/2021, 2:16 PM

## 2021-09-02 DIAGNOSIS — Z7409 Other reduced mobility: Secondary | ICD-10-CM | POA: Diagnosis not present

## 2021-09-02 DIAGNOSIS — F4323 Adjustment disorder with mixed anxiety and depressed mood: Secondary | ICD-10-CM | POA: Diagnosis not present

## 2021-09-02 DIAGNOSIS — R531 Weakness: Secondary | ICD-10-CM | POA: Diagnosis not present

## 2021-09-02 DIAGNOSIS — I10 Essential (primary) hypertension: Secondary | ICD-10-CM | POA: Diagnosis not present

## 2021-09-02 DIAGNOSIS — E119 Type 2 diabetes mellitus without complications: Secondary | ICD-10-CM | POA: Diagnosis not present

## 2021-09-02 DIAGNOSIS — E2839 Other primary ovarian failure: Secondary | ICD-10-CM | POA: Diagnosis not present

## 2021-09-02 DIAGNOSIS — F32A Depression, unspecified: Secondary | ICD-10-CM | POA: Diagnosis not present

## 2021-09-02 DIAGNOSIS — R69 Illness, unspecified: Secondary | ICD-10-CM | POA: Diagnosis not present

## 2021-09-02 DIAGNOSIS — Z8669 Personal history of other diseases of the nervous system and sense organs: Secondary | ICD-10-CM | POA: Diagnosis not present

## 2021-09-02 DIAGNOSIS — R001 Bradycardia, unspecified: Secondary | ICD-10-CM | POA: Diagnosis not present

## 2021-09-02 DIAGNOSIS — Z8673 Personal history of transient ischemic attack (TIA), and cerebral infarction without residual deficits: Secondary | ICD-10-CM | POA: Diagnosis not present

## 2021-09-02 DIAGNOSIS — E11319 Type 2 diabetes mellitus with unspecified diabetic retinopathy without macular edema: Secondary | ICD-10-CM | POA: Diagnosis not present

## 2021-09-02 DIAGNOSIS — Z7401 Bed confinement status: Secondary | ICD-10-CM | POA: Diagnosis not present

## 2021-09-02 DIAGNOSIS — D649 Anemia, unspecified: Secondary | ICD-10-CM | POA: Diagnosis not present

## 2021-09-02 DIAGNOSIS — I700 Atherosclerosis of aorta: Secondary | ICD-10-CM | POA: Diagnosis not present

## 2021-09-02 DIAGNOSIS — G4730 Sleep apnea, unspecified: Secondary | ICD-10-CM | POA: Diagnosis not present

## 2021-09-02 DIAGNOSIS — F419 Anxiety disorder, unspecified: Secondary | ICD-10-CM | POA: Diagnosis not present

## 2021-09-02 DIAGNOSIS — D509 Iron deficiency anemia, unspecified: Secondary | ICD-10-CM | POA: Diagnosis not present

## 2021-09-02 DIAGNOSIS — G8190 Hemiplegia, unspecified affecting unspecified side: Secondary | ICD-10-CM | POA: Diagnosis not present

## 2021-09-02 DIAGNOSIS — M2550 Pain in unspecified joint: Secondary | ICD-10-CM | POA: Diagnosis not present

## 2021-09-02 DIAGNOSIS — J90 Pleural effusion, not elsewhere classified: Secondary | ICD-10-CM | POA: Diagnosis not present

## 2021-09-02 DIAGNOSIS — R42 Dizziness and giddiness: Secondary | ICD-10-CM | POA: Diagnosis not present

## 2021-09-02 DIAGNOSIS — J45909 Unspecified asthma, uncomplicated: Secondary | ICD-10-CM | POA: Diagnosis not present

## 2021-09-02 DIAGNOSIS — R062 Wheezing: Secondary | ICD-10-CM | POA: Diagnosis not present

## 2021-09-02 DIAGNOSIS — Z8719 Personal history of other diseases of the digestive system: Secondary | ICD-10-CM | POA: Diagnosis not present

## 2021-09-02 DIAGNOSIS — W101XXA Fall (on)(from) sidewalk curb, initial encounter: Secondary | ICD-10-CM | POA: Diagnosis not present

## 2021-09-02 DIAGNOSIS — S20229A Contusion of unspecified back wall of thorax, initial encounter: Secondary | ICD-10-CM | POA: Diagnosis not present

## 2021-09-02 DIAGNOSIS — N184 Chronic kidney disease, stage 4 (severe): Secondary | ICD-10-CM | POA: Diagnosis not present

## 2021-09-02 DIAGNOSIS — N390 Urinary tract infection, site not specified: Secondary | ICD-10-CM | POA: Diagnosis not present

## 2021-09-02 NOTE — Final Progress Note (Signed)
Pt transported by [facility] to [facility]

## 2021-09-03 LAB — CULTURE, BLOOD (ROUTINE X 2)
Culture: NO GROWTH
Culture: NO GROWTH
Special Requests: ADEQUATE

## 2021-09-04 DIAGNOSIS — R531 Weakness: Secondary | ICD-10-CM | POA: Diagnosis not present

## 2021-09-04 DIAGNOSIS — R69 Illness, unspecified: Secondary | ICD-10-CM | POA: Diagnosis not present

## 2021-09-04 DIAGNOSIS — N390 Urinary tract infection, site not specified: Secondary | ICD-10-CM | POA: Diagnosis not present

## 2021-09-04 DIAGNOSIS — E119 Type 2 diabetes mellitus without complications: Secondary | ICD-10-CM | POA: Diagnosis not present

## 2021-09-04 DIAGNOSIS — G8190 Hemiplegia, unspecified affecting unspecified side: Secondary | ICD-10-CM | POA: Diagnosis not present

## 2021-09-04 DIAGNOSIS — I10 Essential (primary) hypertension: Secondary | ICD-10-CM | POA: Diagnosis not present

## 2021-09-04 DIAGNOSIS — N184 Chronic kidney disease, stage 4 (severe): Secondary | ICD-10-CM | POA: Diagnosis not present

## 2021-09-07 DIAGNOSIS — Z7409 Other reduced mobility: Secondary | ICD-10-CM | POA: Diagnosis not present

## 2021-09-07 DIAGNOSIS — R69 Illness, unspecified: Secondary | ICD-10-CM | POA: Diagnosis not present

## 2021-09-07 DIAGNOSIS — N390 Urinary tract infection, site not specified: Secondary | ICD-10-CM | POA: Diagnosis not present

## 2021-09-07 DIAGNOSIS — I10 Essential (primary) hypertension: Secondary | ICD-10-CM | POA: Diagnosis not present

## 2021-09-07 DIAGNOSIS — J45909 Unspecified asthma, uncomplicated: Secondary | ICD-10-CM | POA: Diagnosis not present

## 2021-09-07 DIAGNOSIS — E119 Type 2 diabetes mellitus without complications: Secondary | ICD-10-CM | POA: Diagnosis not present

## 2021-09-07 DIAGNOSIS — G8190 Hemiplegia, unspecified affecting unspecified side: Secondary | ICD-10-CM | POA: Diagnosis not present

## 2021-09-15 DIAGNOSIS — D649 Anemia, unspecified: Secondary | ICD-10-CM | POA: Diagnosis not present

## 2021-09-15 DIAGNOSIS — E119 Type 2 diabetes mellitus without complications: Secondary | ICD-10-CM | POA: Diagnosis not present

## 2021-09-15 DIAGNOSIS — I10 Essential (primary) hypertension: Secondary | ICD-10-CM | POA: Diagnosis not present

## 2021-09-15 DIAGNOSIS — N184 Chronic kidney disease, stage 4 (severe): Secondary | ICD-10-CM | POA: Diagnosis not present

## 2021-09-15 DIAGNOSIS — N390 Urinary tract infection, site not specified: Secondary | ICD-10-CM | POA: Diagnosis not present

## 2021-09-15 DIAGNOSIS — R69 Illness, unspecified: Secondary | ICD-10-CM | POA: Diagnosis not present

## 2021-09-15 DIAGNOSIS — J45909 Unspecified asthma, uncomplicated: Secondary | ICD-10-CM | POA: Diagnosis not present

## 2021-09-15 DIAGNOSIS — R531 Weakness: Secondary | ICD-10-CM | POA: Diagnosis not present

## 2021-09-19 ENCOUNTER — Ambulatory Visit (INDEPENDENT_AMBULATORY_CARE_PROVIDER_SITE_OTHER): Payer: Medicare HMO | Admitting: Family Medicine

## 2021-09-19 ENCOUNTER — Encounter: Payer: Self-pay | Admitting: Family Medicine

## 2021-09-19 ENCOUNTER — Other Ambulatory Visit: Payer: Self-pay

## 2021-09-19 VITALS — BP 148/68 | HR 61 | Temp 98.1°F | Resp 18 | Ht 63.0 in | Wt 174.0 lb

## 2021-09-19 DIAGNOSIS — E1165 Type 2 diabetes mellitus with hyperglycemia: Secondary | ICD-10-CM

## 2021-09-19 DIAGNOSIS — I10 Essential (primary) hypertension: Secondary | ICD-10-CM

## 2021-09-19 DIAGNOSIS — J4531 Mild persistent asthma with (acute) exacerbation: Secondary | ICD-10-CM

## 2021-09-19 DIAGNOSIS — R42 Dizziness and giddiness: Secondary | ICD-10-CM

## 2021-09-19 DIAGNOSIS — Z7409 Other reduced mobility: Secondary | ICD-10-CM

## 2021-09-19 DIAGNOSIS — N390 Urinary tract infection, site not specified: Secondary | ICD-10-CM

## 2021-09-19 DIAGNOSIS — E11319 Type 2 diabetes mellitus with unspecified diabetic retinopathy without macular edema: Secondary | ICD-10-CM | POA: Diagnosis not present

## 2021-09-19 MED ORDER — CEPHALEXIN 500 MG PO CAPS: 500.0000 mg | ORAL_CAPSULE | Freq: Every day | ORAL | 3 refills | Status: DC

## 2021-09-19 NOTE — Progress Notes (Signed)
Subjective:    Patient ID: Renee [name], female    DOB: 27-May-1929, 88 y.o.   MRN: [id number] Admit date: 08/29/2021 Discharge date: 09/01/2021   Admitted From: home Discharge disposition: SNF- short term     Recommendations for Outpatient Follow-Up:    Adjust BP meds as needed-- would avoid BB due to bradycardia BMP 1 week     Discharge Diagnosis:    Principal Problem:   Weakness secondary to UTI Active Problems:   Type 2 diabetes, controlled, with retinopathy ([place])   Essential hypertension   Hemiplegia, late effect of cerebrovascular disease ([place])   Anxiety and depression   Sleep apnea   CKD (chronic kidney disease) stage 4, GFR 15-29 ml/min (HCC)   Normocytic anemia   UTI (urinary tract infection)   Wheezing on expiration   Bradycardia       Discharge Condition: Improved.   Diet recommendation: Low sodium, heart healthy.  Carbohydrate-modified.    Wound care: None.   Code status: DNR     History of Present Illness:    Renee [name] is a 88 y.o. female with medical history significant of T2DM, HTN, HLD, hx of CVA with residual left weakness, OSA, CKD stage IV, anxiety and depression who presents to ED with weakness. She states she didn't feel good last night and felt "hot." She didn't feel right. When she got up this AM she thought her feet felt weird and called EMS so she was brought to the ED. Denies any unilateral weakness, slurred speech or droopy face. She denies any confusion.    She denies any fever/chills, no headache or vision changes, no chest pain or palpitations, no shortness of breath, occasional cough, no stomach pain, no N/V/D. She has no dysuria, but has increased frequency and urgency. No blood in her urine and no CVA tenderness. She has mild leg swelling, at baseline. She has been eating and drinking well.    She needs assistance with ADLs. She does not drive.      Hospital Course by Problem:    Weakness secondary to UTI -work up  unremarkable except for UA suspicious for infection -rocephin, culture appears to have never been sent- treated for 3 days -CT head negative for acute event -TSH recently checked and wnl  -d/c BB -home caregivers with COVID- plan to d/c home once caregiver recovered   Wheezing on expiration- (present on admission) Patient with wheezing on exam, but denies any shortness of breath/cough No history of copd, but she tells me she had asthma as a child -albuterol prn -continue to follow clinically  -had wheezing in November when admitted for viral infection.    Type 2 diabetes, controlled, with retinopathy ([place])- (present on admission) a1c 8.3 on 05/2021 -d/c actos   Essential hypertension- (present on admission) Continue home medication- adjust as needed -hold BB due to bradycardia   Anxiety and depression- (present on admission) Stable continue zoloft, remeron and xanax prn    CKD (chronic kidney disease) stage 4, GFR 15-29 ml/min (HCC)- (present on admission) At baseline (1.35-1.77), continue to monitor  -stable   Hemiplegia, late effect of cerebrovascular disease ([place]) At baseline, CTH with no acute finding Continue ASA    Normocytic anemia- (present on admission) Appears at baseline, continue to monitor  -? From ckd   Sleep apnea- (present on admission) -No longer on cpap    obesity Body mass index is 32.77 kg/m.  09/19/21 Patient is here today for follow-up.  She states she  feels back to her baseline.  She went to the emergency room because she was lightheaded dizzy and confused.  In the emergency room she was treated for possible UTI.  She denies any dysuria.  She is back on Keflex 500 mg daily as a preventative given the frequency and severity of her urinary tract infections.  She is not using her topical estradiol.  She was not sure what this was for.  I explained to her that atrophic vaginitis can increase the frequency of bladder infections and therefore we are using  the topical estrogen to try to reduce the frequency of bladder infections.  Apparently at the hospital she was wheezing on physical exam.  Today her lungs are clear.  She states that she uses her albuterol less than once a week.  She states that her breathing feels fine on.  Her blood pressure today is slightly elevated.  She denies any chest pain.  She denies any shortness of breath.  She is checking her sugars quite regularly.  They are typically around 150.  Given her age and her frail situation, my goal for this patient is to keep her blood sugars between 100-200.  She states that she seldom sees her blood sugar over 200.  Her last A1c was 8.2 in October.  She is due to recheck that today.  She also has chronic kidney disease.  She denies any dehydration.  She states that she feels normal.  She denies any lightheadedness or dizziness.  She denies any diarrhea or constipation.  There is no leg swelling.  There is no evidence of fluid overload. Past Medical History:  Diagnosis Date   Angioedema    possibly from voltaren   Bronchopneumonia 12/11/2016   Degenerative disc disease    Diabetes mellitus    type II   Hyperlipidemia    Hypertension    LVH (left ventricular hypertrophy)    and atrial enlargement by echo in past with nl EF   Nasal pruritis    Osteoarthritis    Osteopenia    Renal insufficiency    Sleep apnea    Stroke ([place]) 05/2010   Small vessel sobcortical (in [place]) with Dr Leonie Man, residual L hemiparesis   Vitamin B 12 deficiency 04/08   Past Surgical History:  Procedure Laterality Date   ABDOMINAL HYSTERECTOMY     BSO-fibroids   APPENDECTOMY     BACK SURGERY     COLON SURGERY     due to punctured intestines   EYE SURGERY     cataract extraction   IR THORACENTESIS ASP PLEURAL SPACE W/IMG GUIDE  06/07/2020   KNEE SURGERY     arthroscope   PARS PLANA VITRECTOMY  07/31/2011   Procedure: PARS PLANA VITRECTOMY WITH 25 GAUGE;  Surgeon: [name], MD;  Location: [facility];  Service: Ophthalmology;  Laterality: Right;  REMOVAL OF SILICONE OIL AND LASER RIGHT EYE   RETINAL DETACHMENT SURGERY  02/18/11   times 2   SPINE SURGERY  08/09   spinal decompression surgery   Current Outpatient Medications on File Prior to Visit  Medication Sig Dispense Refill   acetaminophen (TYLENOL) 500 MG tablet Take 1,000 mg by mouth at bedtime.     albuterol (VENTOLIN HFA) 108 (90 Base) MCG/ACT inhaler INHALE 2 PUFFS INTO THE LUNGS EVERY 4 HOURS AS NEEDED FOR WHEEZING/SHORTNESS OF BREATH (Patient taking differently: Inhale 2 puffs into the lungs every 4 (four) hours as needed for shortness of breath or wheezing.) 18  each 3   ALPRAZolam (XANAX) 0.5 MG tablet Take 1 tablet (0.5 mg total) by mouth 2 (two) times daily. TAKE 1/2 TO 1 TABLET BY MOUTH TWICE A DAY AS NEEDED FOR ANXIETY Strength: 0.5 mg 20 tablet 0   amLODipine (NORVASC) 10 MG tablet Take 1 tablet (10 mg total) by mouth daily. 90 tablet 3   aspirin EC 325 MG tablet Take 325 mg by mouth in the morning.     Blood Glucose Monitoring Suppl (ONE TOUCH ULTRA 2) w/Device KIT Check blood sugar once daily and as directed. Dx E11.9 1 each 0   calcium-vitamin D (OSCAL WITH D) 500-200 MG-UNIT per tablet Take 1 tablet by mouth daily.     Cholecalciferol (VITAMIN D-3) 25 MCG (1000 UT) CAPS Take 1,000 Units by mouth in the morning.     Cranberry 500 MG TABS Take 500 mg by mouth in the morning and at bedtime.     docusate sodium (COLACE) 100 MG capsule Take 200 mg by mouth at bedtime.     estradiol (ESTRACE) 0.1 MG/GM vaginal cream PLEASE SEE ATTACHED FOR DETAILED DIRECTIONS     FLONASE 50 MCG/ACT nasal spray Place 2 sprays into both nostrils See admin instructions. Instill 2 sprays into each nostril at bedtime when not using Nasacort     glipiZIDE (GLUCOTROL XL) 5 MG 24 hr tablet Take 5 mg by mouth daily.     hydrALAZINE (APRESOLINE) 100 MG tablet Take 1 tablet (100 mg total) by mouth 3 (three) times daily. 270 tablet 3   Lancets (ONETOUCH  DELICA PLUS ZOXWRU04V) MISC CHECK BLOOD SUGAR ONCE DAILY AND AS DIRECTED 100 each 2   mirtazapine (REMERON) 15 MG tablet Take 1 tablet (15 mg total) by mouth at bedtime. 90 tablet 3   Multiple Vitamin (MULTIVITAMIN) capsule Take 1 capsule by mouth daily.     Multiple Vitamins-Minerals (PRESERVISION AREDS 2+MULTI VIT) CAPS Take 1 capsule by mouth in the morning and at bedtime.     Olopatadine HCl (PATADAY OP) Place 1 drop into both eyes 2 (two) times daily as needed (for itching or redness).     ONETOUCH ULTRA test strip CHECK BLOOD SUGAR ONCE DAILY AND AS DIRECTED. DX E11.9 100 strip 1   sertraline (ZOLOFT) 50 MG tablet TAKE 1 TABLET BY MOUTH EVERY DAY 90 tablet 1   sitaGLIPtin (JANUVIA) 100 MG tablet Take 1 tablet (100 mg total) by mouth daily. 90 tablet 3   trandolapril (MAVIK) 4 MG tablet Take 1 tablet (4 mg total) by mouth daily. 90 tablet 3   triamcinolone (NASACORT) 55 MCG/ACT AERO nasal inhaler Place 2 sprays into the nose daily. (Patient taking differently: Place 2 sprays into the nose See admin instructions. Instill 2 sprays into each nostril at bedtime when not using Flonase) 1 each 5   vitamin B-12 (CYANOCOBALAMIN) 1000 MCG tablet Take 1,000 mcg by mouth daily.      No current facility-administered medications on file prior to visit.   Allergies  Allergen Reactions   Tape Other (See Comments)    The patient's skin is VERY THIN- will TEAR EASILY!!   Diclofenac Sodium Swelling and Other (See Comments)    Angioedema   Pioglitazone Swelling and Other (See Comments)    Re-started in 2022   Social History   Socioeconomic History   Marital status: Widowed    Spouse name: Not on file   Number of children: Not on file   Years of education: Not on file   Highest education  level: Not on file  Occupational History   Not on file  Tobacco Use   Smoking status: Never   Smokeless tobacco: Never  Vaping Use   Vaping Use: Not on file  Substance and Sexual Activity   Alcohol use: No     Alcohol/week: 0.0 standard drinks   Drug use: No   Sexual activity: Never  Other Topics Concern   Not on file  Social History Narrative   Not on file   Social Determinants of Health   Financial Resource Strain: Low Risk    Difficulty of Paying Living Expenses: Not hard at all  Food Insecurity: No Food Insecurity   Worried About Charity fundraiser in the Last Year: Never true   [place] in the Last Year: Never true  Transportation Needs: No Transportation Needs   Lack of Transportation (Medical): No   Lack of Transportation (Non-Medical): No  Physical Activity: Inactive   Days of Exercise per Week: 0 days   Minutes of Exercise per Session: 0 min  Stress: No Stress Concern Present   Feeling of Stress : Not at all  Social Connections: Socially Isolated   Frequency of Communication with Friends and Family: More than three times a week   Frequency of Social Gatherings with Friends and Family: Once a week   Attends Religious Services: Never   Marine scientist or Organizations: No   Attends Archivist Meetings: Never   Marital Status: Widowed  Human resources officer Violence: Not At Risk   Fear of Current or Ex-Partner: No   Emotionally Abused: No   Physically Abused: No   Sexually Abused: No      Review of Systems  Musculoskeletal:  Positive for back pain.  All other systems reviewed and are negative.     Objective:   Physical Exam Vitals reviewed.  Constitutional:      General: She is not in acute distress.    Appearance: Normal appearance. She is obese. She is not ill-appearing, toxic-appearing or diaphoretic.  HENT:     Right Ear: Tympanic membrane and ear canal normal.     Left Ear: Tympanic membrane and ear canal normal.     Nose: Nose normal. No congestion or rhinorrhea.  Cardiovascular:     Rate and Rhythm: Normal rate and regular rhythm.     Heart sounds: Normal heart sounds. No murmur heard.   No friction rub. No gallop.  Pulmonary:      Effort: Pulmonary effort is normal. No tachypnea or respiratory distress.     Breath sounds: Normal breath sounds. No stridor or decreased air movement. No wheezing, rhonchi or rales.  Musculoskeletal:     Right lower leg: No edema.     Left lower leg: No edema.  Neurological:     Mental Status: She is alert and oriented to person, place, and time. Mental status is at baseline.     Sensory: No sensory deficit.     Motor: Weakness present.     Coordination: Coordination abnormal.     Gait: Gait abnormal.  Chronic left facial droop due to her previous remote right basal ganglial infarct        Assessment & Plan:  Type 2 diabetes mellitus with hyperglycemia, without long-term current use of insulin (HCC) - Plan: Hemoglobin A1c, CBC with Differential/Platelet, COMPLETE METABOLIC PANEL WITH GFR  Frequent UTI  Dizziness  Mild persistent asthma with exacerbation  Essential hypertension  Mobility impaired First, regarding her wheezing,  she has mild intermittent asthma.  At the present time using albuterol sparingly as needed is sufficient.  Does not require a daily inhaler.  Second regarding her frequent urinary tract infections.  I suspect that some of this may be bacteriuria due to colonization.  Whenever she feels weak or tired, people are checking her urine sample and finding bacteria.  However often she does not have symptoms such as dysuria.  Therefore I would try to avoid treating asymptomatic bacteriuria unless the patient has dysuria, an elevated white count, abdominal pain, or fevers.  However I have encouraged her to resume her Keflex 500 mg daily preventative medication and also to add Estrace vaginal cream daily applied to the vagina to help reduce the frequency of bladder infections.  Repeat CBC and CMP today to monitor renal function.  Check an A1c.  Goal A1c is less than 8.

## 2021-09-20 DIAGNOSIS — I129 Hypertensive chronic kidney disease with stage 1 through stage 4 chronic kidney disease, or unspecified chronic kidney disease: Secondary | ICD-10-CM | POA: Diagnosis not present

## 2021-09-20 DIAGNOSIS — N184 Chronic kidney disease, stage 4 (severe): Secondary | ICD-10-CM | POA: Diagnosis not present

## 2021-09-20 DIAGNOSIS — R531 Weakness: Secondary | ICD-10-CM | POA: Diagnosis not present

## 2021-09-20 DIAGNOSIS — R69 Illness, unspecified: Secondary | ICD-10-CM | POA: Diagnosis not present

## 2021-09-20 DIAGNOSIS — D631 Anemia in chronic kidney disease: Secondary | ICD-10-CM | POA: Diagnosis not present

## 2021-09-20 DIAGNOSIS — E11319 Type 2 diabetes mellitus with unspecified diabetic retinopathy without macular edema: Secondary | ICD-10-CM | POA: Diagnosis not present

## 2021-09-20 DIAGNOSIS — I69354 Hemiplegia and hemiparesis following cerebral infarction affecting left non-dominant side: Secondary | ICD-10-CM | POA: Diagnosis not present

## 2021-09-20 DIAGNOSIS — N390 Urinary tract infection, site not specified: Secondary | ICD-10-CM | POA: Diagnosis not present

## 2021-09-20 DIAGNOSIS — E1122 Type 2 diabetes mellitus with diabetic chronic kidney disease: Secondary | ICD-10-CM | POA: Diagnosis not present

## 2021-09-20 LAB — COMPLETE METABOLIC PANEL WITH GFR
AG Ratio: 2.1 (calc) (ref 1.0–2.5)
ALT: 12 U/L (ref 6–29)
AST: 14 U/L (ref 10–35)
Albumin: 3.7 g/dL (ref 3.6–5.1)
Alkaline phosphatase (APISO): 74 U/L (ref 37–153)
BUN/Creatinine Ratio: 15 (calc) (ref 6–22)
BUN: 23 mg/dL (ref 7–25)
CO2: 28 mmol/L (ref 20–32)
Calcium: 8.8 mg/dL (ref 8.6–10.4)
Chloride: 105 mmol/L (ref 98–110)
Creat: 1.52 mg/dL — ABNORMAL HIGH (ref 0.60–0.95)
Globulin: 1.8 g/dL (calc) — ABNORMAL LOW (ref 1.9–3.7)
Glucose, Bld: 241 mg/dL — ABNORMAL HIGH (ref 65–99)
Potassium: 4.3 mmol/L (ref 3.5–5.3)
Sodium: 140 mmol/L (ref 135–146)
Total Bilirubin: 0.3 mg/dL (ref 0.2–1.2)
Total Protein: 5.5 g/dL — ABNORMAL LOW (ref 6.1–8.1)
eGFR: 32 mL/min/{1.73_m2} — ABNORMAL LOW (ref >=60)

## 2021-09-20 LAB — CBC WITH DIFFERENTIAL/PLATELET
Absolute Monocytes: 533 cells/uL (ref 200–950)
Basophils Absolute: 72 cells/uL (ref 0–200)
Basophils Relative: 1.1 %
Eosinophils Absolute: 429 cells/uL (ref 15–500)
Eosinophils Relative: 6.6 %
HCT: 35.3 % (ref 35.0–45.0)
Hemoglobin: 11.2 g/dL — ABNORMAL LOW (ref 11.7–15.5)
Lymphs Abs: 1203 cells/uL (ref 850–3900)
MCH: 27.6 pg (ref 27.0–33.0)
MCHC: 31.7 g/dL — ABNORMAL LOW (ref 32.0–36.0)
MCV: 86.9 fL (ref 80.0–100.0)
MPV: 11.6 fL (ref 7.5–12.5)
Monocytes Relative: 8.2 %
Neutro Abs: 4264 cells/uL (ref 1500–7800)
Neutrophils Relative %: 65.6 %
Platelets: 220 10*3/uL (ref 140–400)
RBC: 4.06 10*6/uL (ref 3.80–5.10)
RDW: 12.4 % (ref 11.0–15.0)
Total Lymphocyte: 18.5 %
WBC: 6.5 10*3/uL (ref 3.8–10.8)

## 2021-09-20 LAB — HEMOGLOBIN A1C
Hgb A1c MFr Bld: 8 % of total Hgb — ABNORMAL HIGH (ref <5.7)
Mean Plasma Glucose: 183 mg/dL
eAG (mmol/L): 10.1 mmol/L

## 2021-09-21 ENCOUNTER — Telehealth: Payer: Self-pay

## 2021-09-21 MED ORDER — PIOGLITAZONE HCL 30 MG PO TABS: 30.0000 mg | ORAL_TABLET | Freq: Every day | ORAL | 3 refills | Status: DC

## 2021-09-21 NOTE — Telephone Encounter (Signed)
Patient aware of results and recommendations. °

## 2021-09-21 NOTE — Telephone Encounter (Signed)
-----   Message from [name], MD sent at 09/21/2021  7:11 AM EST ----- A1c is 8.  Add actos 30 mg a day and recheck in 3 months.

## 2021-09-22 ENCOUNTER — Other Ambulatory Visit: Payer: Self-pay

## 2021-09-22 ENCOUNTER — Telehealth: Payer: Self-pay

## 2021-09-22 DIAGNOSIS — D631 Anemia in chronic kidney disease: Secondary | ICD-10-CM | POA: Diagnosis not present

## 2021-09-22 DIAGNOSIS — N390 Urinary tract infection, site not specified: Secondary | ICD-10-CM | POA: Diagnosis not present

## 2021-09-22 DIAGNOSIS — I69354 Hemiplegia and hemiparesis following cerebral infarction affecting left non-dominant side: Secondary | ICD-10-CM | POA: Diagnosis not present

## 2021-09-22 DIAGNOSIS — N184 Chronic kidney disease, stage 4 (severe): Secondary | ICD-10-CM | POA: Diagnosis not present

## 2021-09-22 DIAGNOSIS — R69 Illness, unspecified: Secondary | ICD-10-CM | POA: Diagnosis not present

## 2021-09-22 DIAGNOSIS — R531 Weakness: Secondary | ICD-10-CM | POA: Diagnosis not present

## 2021-09-22 DIAGNOSIS — E11319 Type 2 diabetes mellitus with unspecified diabetic retinopathy without macular edema: Secondary | ICD-10-CM | POA: Diagnosis not present

## 2021-09-22 DIAGNOSIS — E1122 Type 2 diabetes mellitus with diabetic chronic kidney disease: Secondary | ICD-10-CM | POA: Diagnosis not present

## 2021-09-22 DIAGNOSIS — I129 Hypertensive chronic kidney disease with stage 1 through stage 4 chronic kidney disease, or unspecified chronic kidney disease: Secondary | ICD-10-CM | POA: Diagnosis not present

## 2021-09-22 NOTE — Telephone Encounter (Signed)
Liji w/[facility] called for verbal orders for PT 1xw/1w, 2xw/4w and 1xw/3w to work on gait, balance and strength. Verbal approval given. Nothing further needed at this time.

## 2021-09-26 DIAGNOSIS — N184 Chronic kidney disease, stage 4 (severe): Secondary | ICD-10-CM | POA: Diagnosis not present

## 2021-09-26 DIAGNOSIS — R531 Weakness: Secondary | ICD-10-CM | POA: Diagnosis not present

## 2021-09-26 DIAGNOSIS — E11319 Type 2 diabetes mellitus with unspecified diabetic retinopathy without macular edema: Secondary | ICD-10-CM | POA: Diagnosis not present

## 2021-09-26 DIAGNOSIS — R69 Illness, unspecified: Secondary | ICD-10-CM | POA: Diagnosis not present

## 2021-09-26 DIAGNOSIS — D631 Anemia in chronic kidney disease: Secondary | ICD-10-CM | POA: Diagnosis not present

## 2021-09-26 DIAGNOSIS — I129 Hypertensive chronic kidney disease with stage 1 through stage 4 chronic kidney disease, or unspecified chronic kidney disease: Secondary | ICD-10-CM | POA: Diagnosis not present

## 2021-09-26 DIAGNOSIS — N390 Urinary tract infection, site not specified: Secondary | ICD-10-CM | POA: Diagnosis not present

## 2021-09-26 DIAGNOSIS — E1122 Type 2 diabetes mellitus with diabetic chronic kidney disease: Secondary | ICD-10-CM | POA: Diagnosis not present

## 2021-09-26 DIAGNOSIS — I69354 Hemiplegia and hemiparesis following cerebral infarction affecting left non-dominant side: Secondary | ICD-10-CM | POA: Diagnosis not present

## 2021-09-27 DIAGNOSIS — R062 Wheezing: Secondary | ICD-10-CM | POA: Diagnosis not present

## 2021-09-29 DIAGNOSIS — E1122 Type 2 diabetes mellitus with diabetic chronic kidney disease: Secondary | ICD-10-CM | POA: Diagnosis not present

## 2021-09-29 DIAGNOSIS — D631 Anemia in chronic kidney disease: Secondary | ICD-10-CM | POA: Diagnosis not present

## 2021-09-29 DIAGNOSIS — R531 Weakness: Secondary | ICD-10-CM | POA: Diagnosis not present

## 2021-09-29 DIAGNOSIS — R69 Illness, unspecified: Secondary | ICD-10-CM | POA: Diagnosis not present

## 2021-09-29 DIAGNOSIS — I129 Hypertensive chronic kidney disease with stage 1 through stage 4 chronic kidney disease, or unspecified chronic kidney disease: Secondary | ICD-10-CM | POA: Diagnosis not present

## 2021-09-29 DIAGNOSIS — N184 Chronic kidney disease, stage 4 (severe): Secondary | ICD-10-CM | POA: Diagnosis not present

## 2021-09-29 DIAGNOSIS — N390 Urinary tract infection, site not specified: Secondary | ICD-10-CM | POA: Diagnosis not present

## 2021-09-29 DIAGNOSIS — I69354 Hemiplegia and hemiparesis following cerebral infarction affecting left non-dominant side: Secondary | ICD-10-CM | POA: Diagnosis not present

## 2021-09-29 DIAGNOSIS — E11319 Type 2 diabetes mellitus with unspecified diabetic retinopathy without macular edema: Secondary | ICD-10-CM | POA: Diagnosis not present

## 2021-10-02 DIAGNOSIS — R69 Illness, unspecified: Secondary | ICD-10-CM | POA: Diagnosis not present

## 2021-10-02 DIAGNOSIS — I129 Hypertensive chronic kidney disease with stage 1 through stage 4 chronic kidney disease, or unspecified chronic kidney disease: Secondary | ICD-10-CM | POA: Diagnosis not present

## 2021-10-02 DIAGNOSIS — R531 Weakness: Secondary | ICD-10-CM | POA: Diagnosis not present

## 2021-10-02 DIAGNOSIS — D631 Anemia in chronic kidney disease: Secondary | ICD-10-CM | POA: Diagnosis not present

## 2021-10-02 DIAGNOSIS — N390 Urinary tract infection, site not specified: Secondary | ICD-10-CM | POA: Diagnosis not present

## 2021-10-02 DIAGNOSIS — E11319 Type 2 diabetes mellitus with unspecified diabetic retinopathy without macular edema: Secondary | ICD-10-CM | POA: Diagnosis not present

## 2021-10-02 DIAGNOSIS — N184 Chronic kidney disease, stage 4 (severe): Secondary | ICD-10-CM | POA: Diagnosis not present

## 2021-10-02 DIAGNOSIS — I69354 Hemiplegia and hemiparesis following cerebral infarction affecting left non-dominant side: Secondary | ICD-10-CM | POA: Diagnosis not present

## 2021-10-02 DIAGNOSIS — E1122 Type 2 diabetes mellitus with diabetic chronic kidney disease: Secondary | ICD-10-CM | POA: Diagnosis not present

## 2021-10-04 DIAGNOSIS — N184 Chronic kidney disease, stage 4 (severe): Secondary | ICD-10-CM | POA: Diagnosis not present

## 2021-10-04 DIAGNOSIS — R531 Weakness: Secondary | ICD-10-CM | POA: Diagnosis not present

## 2021-10-04 DIAGNOSIS — D631 Anemia in chronic kidney disease: Secondary | ICD-10-CM | POA: Diagnosis not present

## 2021-10-04 DIAGNOSIS — I129 Hypertensive chronic kidney disease with stage 1 through stage 4 chronic kidney disease, or unspecified chronic kidney disease: Secondary | ICD-10-CM | POA: Diagnosis not present

## 2021-10-04 DIAGNOSIS — E11319 Type 2 diabetes mellitus with unspecified diabetic retinopathy without macular edema: Secondary | ICD-10-CM | POA: Diagnosis not present

## 2021-10-04 DIAGNOSIS — R69 Illness, unspecified: Secondary | ICD-10-CM | POA: Diagnosis not present

## 2021-10-04 DIAGNOSIS — N390 Urinary tract infection, site not specified: Secondary | ICD-10-CM | POA: Diagnosis not present

## 2021-10-04 DIAGNOSIS — E1122 Type 2 diabetes mellitus with diabetic chronic kidney disease: Secondary | ICD-10-CM | POA: Diagnosis not present

## 2021-10-04 DIAGNOSIS — I69354 Hemiplegia and hemiparesis following cerebral infarction affecting left non-dominant side: Secondary | ICD-10-CM | POA: Diagnosis not present

## 2021-10-05 ENCOUNTER — Telehealth: Payer: Self-pay

## 2021-10-05 NOTE — Telephone Encounter (Signed)
LeAnn w/[facility] called to report they do not have any OT staff at the current time. She is closing orders for OT. If you would like patient to have OT please refer to another agency.   Please advise, thanks!

## 2021-10-08 ENCOUNTER — Other Ambulatory Visit: Payer: Self-pay | Admitting: Family Medicine

## 2021-10-09 DIAGNOSIS — E1122 Type 2 diabetes mellitus with diabetic chronic kidney disease: Secondary | ICD-10-CM | POA: Diagnosis not present

## 2021-10-09 DIAGNOSIS — N390 Urinary tract infection, site not specified: Secondary | ICD-10-CM | POA: Diagnosis not present

## 2021-10-09 DIAGNOSIS — I69354 Hemiplegia and hemiparesis following cerebral infarction affecting left non-dominant side: Secondary | ICD-10-CM | POA: Diagnosis not present

## 2021-10-09 DIAGNOSIS — R69 Illness, unspecified: Secondary | ICD-10-CM | POA: Diagnosis not present

## 2021-10-09 DIAGNOSIS — D631 Anemia in chronic kidney disease: Secondary | ICD-10-CM | POA: Diagnosis not present

## 2021-10-09 DIAGNOSIS — R531 Weakness: Secondary | ICD-10-CM | POA: Diagnosis not present

## 2021-10-09 DIAGNOSIS — N184 Chronic kidney disease, stage 4 (severe): Secondary | ICD-10-CM | POA: Diagnosis not present

## 2021-10-09 DIAGNOSIS — I129 Hypertensive chronic kidney disease with stage 1 through stage 4 chronic kidney disease, or unspecified chronic kidney disease: Secondary | ICD-10-CM | POA: Diagnosis not present

## 2021-10-09 DIAGNOSIS — E11319 Type 2 diabetes mellitus with unspecified diabetic retinopathy without macular edema: Secondary | ICD-10-CM | POA: Diagnosis not present

## 2021-10-09 NOTE — Telephone Encounter (Signed)
LeAnn left a voicemail message to follow up; needs to know if provider would like them to refer the patient out to another facility since no therapist on site.   Please advise at [phone]

## 2021-10-10 NOTE — Telephone Encounter (Signed)
Spoke with Renee [name], she will send OT referral out to another company. She will call us if she needs any assistance. Nothing further needed at this time.

## 2021-10-10 NOTE — Telephone Encounter (Signed)
Received call from [facility] to ask if provider wants patient to see therapist at [facility] who is now available. Spoke with provider's nurse who  gave approval for patient to stay with them.

## 2021-10-11 ENCOUNTER — Telehealth: Payer: Self-pay

## 2021-10-11 DIAGNOSIS — N184 Chronic kidney disease, stage 4 (severe): Secondary | ICD-10-CM | POA: Diagnosis not present

## 2021-10-11 DIAGNOSIS — N390 Urinary tract infection, site not specified: Secondary | ICD-10-CM | POA: Diagnosis not present

## 2021-10-11 DIAGNOSIS — E1122 Type 2 diabetes mellitus with diabetic chronic kidney disease: Secondary | ICD-10-CM | POA: Diagnosis not present

## 2021-10-11 DIAGNOSIS — R69 Illness, unspecified: Secondary | ICD-10-CM | POA: Diagnosis not present

## 2021-10-11 DIAGNOSIS — I129 Hypertensive chronic kidney disease with stage 1 through stage 4 chronic kidney disease, or unspecified chronic kidney disease: Secondary | ICD-10-CM | POA: Diagnosis not present

## 2021-10-11 DIAGNOSIS — E11319 Type 2 diabetes mellitus with unspecified diabetic retinopathy without macular edema: Secondary | ICD-10-CM | POA: Diagnosis not present

## 2021-10-11 DIAGNOSIS — I69354 Hemiplegia and hemiparesis following cerebral infarction affecting left non-dominant side: Secondary | ICD-10-CM | POA: Diagnosis not present

## 2021-10-11 DIAGNOSIS — R531 Weakness: Secondary | ICD-10-CM | POA: Diagnosis not present

## 2021-10-11 DIAGNOSIS — D631 Anemia in chronic kidney disease: Secondary | ICD-10-CM | POA: Diagnosis not present

## 2021-10-11 MED ORDER — FREESTYLE LIBRE 2 SENSOR MISC: 1.0000 | 5 refills | Status: DC

## 2021-10-11 MED ORDER — FREESTYLE LIBRE 2 READER DEVI: 1.0000 | 1 refills | Status: DC

## 2021-10-11 NOTE — Telephone Encounter (Signed)
Received call from [place], [place] w/[facility] reporting she had visit with patient today and her BP was 170/70. She states per caregiver this has been her average reading for the past several days. She reports HR of 54. She states patient has no other symptoms. She just wanted you to be aware and see if you had any additional recommendations.  Please advise, thank you!

## 2021-10-11 NOTE — Telephone Encounter (Signed)
Received call from [name], patient's caregiver, to request an order for a [employer] system. She states patient does not like having to use the manual glucometer and would like to try this system. Caller states she has several friends who were able to get this approved by Medicare. She would like to have order sent to patient's pharmacy. Explained this may not be covered since patient is not on insulin currently.   Order sent to pharmacy as requested.

## 2021-10-12 MED ORDER — HYDROCHLOROTHIAZIDE 12.5 MG PO CAPS
12.5000 mg | ORAL_CAPSULE | Freq: Every day | ORAL | 3 refills | Status: DC
Start: 2021-10-12 — End: 2021-12-11

## 2021-10-12 NOTE — Telephone Encounter (Signed)
Spoke with Renee [name] and gave recommendations. She will have pt/family pick up rx and call to schedule OV. Rx sent to pharmacy. Nothing further needed at this time.

## 2021-10-13 ENCOUNTER — Ambulatory Visit (INDEPENDENT_AMBULATORY_CARE_PROVIDER_SITE_OTHER): Payer: Medicare HMO | Admitting: *Deleted

## 2021-10-13 DIAGNOSIS — E1122 Type 2 diabetes mellitus with diabetic chronic kidney disease: Secondary | ICD-10-CM | POA: Diagnosis not present

## 2021-10-13 DIAGNOSIS — N390 Urinary tract infection, site not specified: Secondary | ICD-10-CM | POA: Diagnosis not present

## 2021-10-13 DIAGNOSIS — I10 Essential (primary) hypertension: Secondary | ICD-10-CM

## 2021-10-13 DIAGNOSIS — D631 Anemia in chronic kidney disease: Secondary | ICD-10-CM | POA: Diagnosis not present

## 2021-10-13 DIAGNOSIS — I129 Hypertensive chronic kidney disease with stage 1 through stage 4 chronic kidney disease, or unspecified chronic kidney disease: Secondary | ICD-10-CM | POA: Diagnosis not present

## 2021-10-13 DIAGNOSIS — R69 Illness, unspecified: Secondary | ICD-10-CM | POA: Diagnosis not present

## 2021-10-13 DIAGNOSIS — E11319 Type 2 diabetes mellitus with unspecified diabetic retinopathy without macular edema: Secondary | ICD-10-CM | POA: Diagnosis not present

## 2021-10-13 DIAGNOSIS — E1165 Type 2 diabetes mellitus with hyperglycemia: Secondary | ICD-10-CM

## 2021-10-13 DIAGNOSIS — I69354 Hemiplegia and hemiparesis following cerebral infarction affecting left non-dominant side: Secondary | ICD-10-CM | POA: Diagnosis not present

## 2021-10-13 DIAGNOSIS — N184 Chronic kidney disease, stage 4 (severe): Secondary | ICD-10-CM | POA: Diagnosis not present

## 2021-10-13 DIAGNOSIS — R531 Weakness: Secondary | ICD-10-CM | POA: Diagnosis not present

## 2021-10-13 NOTE — Chronic Care Management (AMB) (Signed)
Chronic Care Management   CCM RN Visit Note  10/13/2021 Name: Renee [name] MRN: [id number] DOB: Sep 27, 1928  Subjective: Renee [name] is a 88 y.o. year old female who is a primary care patient of Pickard, Cammie Mcgee, MD. The care management team was consulted for assistance with disease management and care coordination needs.    Engaged with patient by telephone for follow up visit in response to provider referral for case management and/or care coordination services.   Consent to Services:  The patient was given information about Chronic Care Management services, agreed to services, and gave verbal consent prior to initiation of services.  Please see initial visit note for detailed documentation.   Patient agreed to services and verbal consent obtained.   Assessment: Review of patient past medical history, allergies, medications, health status, including review of consultants reports, laboratory and other test data, was performed as part of comprehensive evaluation and provision of chronic care management services.   SDOH (Social Determinants of Health) assessments and interventions performed:    CCM Care Plan  Allergies  Allergen Reactions   Tape Other (See Comments)    The patient's skin is VERY THIN- will TEAR EASILY!!   Diclofenac Sodium Swelling and Other (See Comments)    Angioedema   Pioglitazone Swelling and Other (See Comments)    Re-started in 2022    Outpatient Encounter Medications as of 10/13/2021  Medication Sig   acetaminophen (TYLENOL) 500 MG tablet Take 1,000 mg by mouth at bedtime.   albuterol (VENTOLIN HFA) 108 (90 Base) MCG/ACT inhaler INHALE 2 PUFFS INTO THE LUNGS EVERY 4 HOURS AS NEEDED FOR WHEEZING/SHORTNESS OF BREATH (Patient taking differently: Inhale 2 puffs into the lungs every 4 (four) hours as needed for shortness of breath or wheezing.)   ALPRAZolam (XANAX) 0.5 MG tablet Take 1 tablet (0.5 mg total) by mouth 2 (two) times daily. TAKE 1/2 TO 1 TABLET  BY MOUTH TWICE A DAY AS NEEDED FOR ANXIETY Strength: 0.5 mg   amLODipine (NORVASC) 10 MG tablet TAKE 1 TABLET DAILY   aspirin EC 325 MG tablet Take 325 mg by mouth in the morning.   Blood Glucose Monitoring Suppl (ONE TOUCH ULTRA 2) w/Device KIT Check blood sugar once daily and as directed. Dx E11.9   calcium-vitamin D (OSCAL WITH D) 500-200 MG-UNIT per tablet Take 1 tablet by mouth daily.   Cholecalciferol (VITAMIN D-3) 25 MCG (1000 UT) CAPS Take 1,000 Units by mouth in the morning.   Continuous Blood Gluc Receiver (FREESTYLE LIBRE 2 READER) DEVI 1 each by Does not apply route as directed.   Continuous Blood Gluc Sensor (FREESTYLE LIBRE 2 SENSOR) MISC 1 each by Does not apply route as directed.   Cranberry 500 MG TABS Take 500 mg by mouth in the morning and at bedtime.   docusate sodium (COLACE) 100 MG capsule Take 200 mg by mouth at bedtime.   estradiol (ESTRACE) 0.1 MG/GM vaginal cream PLEASE SEE ATTACHED FOR DETAILED DIRECTIONS   FLONASE 50 MCG/ACT nasal spray Place 2 sprays into both nostrils See admin instructions. Instill 2 sprays into each nostril at bedtime when not using Nasacort   glipiZIDE (GLUCOTROL XL) 5 MG 24 hr tablet Take 5 mg by mouth daily.   hydrALAZINE (APRESOLINE) 100 MG tablet Take 1 tablet (100 mg total) by mouth 3 (three) times daily.   hydrochlorothiazide (MICROZIDE) 12.5 MG capsule Take 1 capsule (12.5 mg total) by mouth daily.   Lancets (ONETOUCH DELICA PLUS [id number]) MISC CHECK BLOOD SUGAR  ONCE DAILY AND AS DIRECTED   metoprolol succinate (TOPROL-XL) 25 MG 24 hr tablet Take 12.5 mg by mouth 2 (two) times daily.   mirtazapine (REMERON) 15 MG tablet Take 1 tablet (15 mg total) by mouth at bedtime.   Multiple Vitamin (MULTIVITAMIN) capsule Take 1 capsule by mouth daily.   Multiple Vitamins-Minerals (PRESERVISION AREDS 2+MULTI VIT) CAPS Take 1 capsule by mouth in the morning and at bedtime.   Olopatadine HCl (PATADAY OP) Place 1 drop into both eyes 2 (two) times daily as  needed (for itching or redness).   ONETOUCH ULTRA test strip CHECK BLOOD SUGAR ONCE DAILY AND AS DIRECTED. DX E11.9   pioglitazone (ACTOS) 30 MG tablet Take 1 tablet (30 mg total) by mouth daily.   sertraline (ZOLOFT) 50 MG tablet TAKE 1 TABLET BY MOUTH EVERY DAY   sitaGLIPtin (JANUVIA) 100 MG tablet Take 1 tablet (100 mg total) by mouth daily.   trandolapril (MAVIK) 4 MG tablet Take 1 tablet (4 mg total) by mouth daily.   triamcinolone (NASACORT) 55 MCG/ACT AERO nasal inhaler Place 2 sprays into the nose daily. (Patient taking differently: Place 2 sprays into the nose See admin instructions. Instill 2 sprays into each nostril at bedtime when not using Flonase)   vitamin B-12 (CYANOCOBALAMIN) 1000 MCG tablet Take 1,000 mcg by mouth daily.    cephALEXin (KEFLEX) 500 MG capsule Take 1 capsule (500 mg total) by mouth daily. (Patient not taking: Reported on 10/13/2021)   No facility-administered encounter medications on file as of 10/13/2021.    Patient Active Problem List   Diagnosis Date Noted   Bradycardia 09/01/2021   UTI (urinary tract infection) 08/29/2021   Wheezing on expiration 08/29/2021   Pleural effusion 05/13/2020   Frequent UTI 05/13/2020   Dizziness 02/12/2020   Normocytic anemia 02/12/2020   Fall involving sidewalk curb 11/29/2019   Contusion of back 11/27/2019   Aortic atherosclerosis ([place]) 07/13/2019   CKD (chronic kidney disease) stage 4, GFR 15-29 ml/min (HCC) 07/01/2019   External hemorrhoid 01/17/2018   History of colitis 01/17/2018   Generalized weakness 12/24/2016   Diabetic retinopathy ([place]) 12/11/2016   History of CVA (cerebrovascular accident) 12/11/2016   Weakness secondary to UTI 10/21/2015   Estrogen deficiency 08/30/2015   Mobility impaired 06/26/2011   History of retinal detachment 01/10/2011   Sleep apnea 11/28/2010   Anxiety and depression 08/25/2010   Hemiplegia, late effect of cerebrovascular disease ([place]) 07/05/2010   POSTHERPETIC NEURALGIA  11/09/2009   Renal insufficiency 06/29/2008   Chronic back pain 01/26/2008   EDEMA 01/26/2008   B12 deficiency 01/10/2007   Type 2 diabetes, controlled, with retinopathy ([place]) 11/27/2006   Essential hypertension 11/27/2006   FIBROCYSTIC BREAST DISEASE 11/27/2006   ROSACEA 11/27/2006   OSTEOARTHRITIS 11/27/2006   URINARY INCONTINENCE, MIXED 11/27/2006    Conditions to be addressed/monitored:HTN and DMII  Care Plan : RN Care Manager plan of care  Updates made by Kassie Mends, RN since 10/13/2021 12:00 AM     Problem: No plan of care established for management of chronic disease states (DM2, HTN, CKD stage 4)   Priority: High     Long-Range Goal: Development of plan of care for chronic disease management (DM2, HTN, CKD stage 4)   Start Date: 07/10/2021  Expected End Date: 01/06/2022  Priority: High  Note:   Current Barriers:  Knowledge Deficits related to plan of care for management of HTN and DMII - patient reports she lives alone, has hired help daily from 9 am-  5 pm, has sister in law she can call for assistance if needed, pt does not drive and needs assistance with ADL's such as bathing, cooking, medication oversight etc and her aide assists.  Patient reports she checks CBG once daily with readings 75-212 range with the occasional >200 if "I eat something I shouldn't", checks blood pressure daily and reports readings " are usually good"  but recently has had some elevated readings 148/60, 163/74, 171/79 and this has been reported to primary care provider by home health RN. Patient reports she has advanced directives with her sister Frazier Butt as HCPOA.  Patient reports home health RN, PT and OT continue working with her.  Patient needs assistance with ongoing teaching/ reinforcement of diabetes/ HTN management and diet.  Permission given to speak with aide Vaughan Basta who reports she is trying to cook healthy foods for patient, assists her to do daily exercises and gets her outside  weather permitting, reports pt is moving around more.  Pt hospitalized 08/29/21 for acute cystitis, and trying to be careful with drinking adequate fluids.  RNCM Clinical Goal(s):  Patient will verbalize understanding of plan for management of HTN and DMII as evidenced by patient report, review of EHR  through collaboration with RN Care manager, provider, and care team.   Interventions: 1:1 collaboration with primary care provider regarding development and update of comprehensive plan of care as evidenced by provider attestation and co-signature Inter-disciplinary care team collaboration (see longitudinal plan of care) Evaluation of current treatment plan related to  self management and patient's adherence to plan as established by provider  Hypertension Interventions: Last practice recorded BP readings:  BP Readings from Last 3 Encounters:  07/10/21 (!) 171/84  06/30/21 134/62  06/26/21 (!) 143/65  Most recent eGFR/CrCl:  Lab Results  Component Value Date   EGFR 27 (L) 05/26/2021    No components found for: CRCL  Evaluation of current treatment plan related to hypertension self management and patient's adherence to plan as established by provider Reviewed medications with patient and discussed importance of compliance Advised patient, providing education and rationale, to monitor blood pressure daily and record, calling PCP for findings outside established parameters Discussed complications of poorly controlled blood pressure such as heart disease, stroke, circulatory complications, vision complications, kidney impairment, sexual dysfunction Reviewed low sodium diet, foods to avoid/ limit- salty snacks and fast food Reinforced importance of working closely with home health PT and OT and completing exercises daily as prescribed Reviewed upcoming scheduled appointments - primary care provider 12/25/21  Diabetes Interventions: Assessed patient's understanding of A1c goal: <7% Reviewed  medications with patient and discussed importance of medication adherence Counseled on importance of regular laboratory monitoring as prescribed Review of patient status, including review of consultants reports, relevant laboratory and other test results, and medications completed Reinforced carbohydrate modified diet Reinforced importance of limiting/ avoiding concentrated sweets Pain assessment completed Talked with caregiver (permission given by pt) and reviewed CBG, BP log Reviewed importance of drinking adequate fluids preferably water Reviewed effect of hyperglycemia on hindering resolution of infections, UTI, delayed healing, etc Lab Results  Component Value Date   HGBA1C 8.3 (H) 05/24/2021    Patient Goals/Self-Care Activities: check blood sugar at prescribed times: once daily drink 6 to 8 glasses of water each day fill half of plate with vegetables manage portion size read food labels for fat, fiber, carbohydrates and portion size keep feet up while sitting wash and dry feet carefully every day check blood pressure daily write blood pressure  results in a log or diary keep a blood pressure log take blood pressure log to all doctor appointments develop an action plan for high blood pressure take medications for blood pressure exactly as prescribed report new symptoms to your doctor Continue to follow low sodium diet- avoid fast food and salty snacks Follow carbohydrate modified diet - avoid concentrated sweets Continue doing daily exercises prescribed by PT and OT Drink adequate fluids especially water Practice good hygiene to prevent UTI, wipe from front to back        Plan:Telephone follow up appointment with care management team member scheduled for:  11/20/21  Jacqlyn Larsen [facility] BSN RN Case Manager [facility] [phone]

## 2021-10-13 NOTE — Patient Instructions (Signed)
Visit Information  Thank you for taking time to visit with me today. Please don't hesitate to contact me if I can be of assistance to you before our next scheduled telephone appointment.  Following are the goals we discussed today:  check blood sugar at prescribed times: once daily drink 6 to 8 glasses of water each day fill half of plate with vegetables manage portion size read food labels for fat, fiber, carbohydrates and portion size keep feet up while sitting wash and dry feet carefully every day check blood pressure daily write blood pressure results in a log or diary keep a blood pressure log take blood pressure log to all doctor appointments develop an action plan for high blood pressure take medications for blood pressure exactly as prescribed report new symptoms to your doctor Continue to follow low sodium diet- avoid fast food and salty snacks Follow carbohydrate modified diet - avoid concentrated sweets Continue doing daily exercises prescribed by PT and OT Drink adequate fluids especially water Practice good hygiene to prevent UTI, wipe from front to back  Our next appointment is by telephone on 11/20/21 at 9 am  Please call the care guide team at [phone] if you need to cancel or reschedule your appointment.   If you are experiencing a Mental Health or [place] or need someone to talk to, please call the Suicide and Crisis Lifeline: 988 call the [place] National Suicide Prevention Lifeline: (682)151-7558 or TTY: (404)713-9161 TTY (708) 535-8680) to talk to a trained counselor call 1-800-273-TALK (toll free, 24 hour hotline) go to [facility] [address], [place] [phone]) call 911   Patient verbalizes understanding of instructions and care plan provided today and agrees to view in [place]. Active MyChart status confirmed with patient.    Jacqlyn Larsen RNC, BSN RN Case Manager [facility] [phone] Urinary Tract Infection, Adult A urinary tract infection (UTI) is an infection of any part of the urinary tract. The urinary tract includes the kidneys, ureters, bladder, and urethra. These organs make, store, and get rid of urine in the body. An upper UTI affects the ureters and kidneys. A lower UTI affects the bladder and urethra. What are the causes? Most urinary tract infections are caused by bacteria in your genital area around your urethra, where urine leaves your body. These bacteria grow and cause inflammation of your urinary tract. What increases the risk? You are more likely to develop this condition if: You have a urinary catheter that stays in place. You are not able to control when you urinate or have a bowel movement (incontinence). You are female and you: Use a spermicide or diaphragm for birth control. Have low estrogen levels. Are pregnant. You have certain genes that increase your risk. You are sexually active. You take antibiotic medicines. You have a condition that causes your flow of urine to slow down, such as: An enlarged prostate, if you are female. Blockage in your urethra. A kidney stone. A nerve condition that affects your bladder control (neurogenic bladder). Not getting enough to drink, or not urinating often. You have certain medical conditions, such as: Diabetes. A weak disease-fighting system (immunesystem). Sickle cell disease. Gout. Spinal cord injury. What are the signs or symptoms? Symptoms of this condition include: Needing to urinate right away (urgency). Frequent urination. This may include small amounts of urine each time you urinate. Pain or burning with urination. Blood in the urine. Urine that smells bad or unusual. Trouble urinating. Cloudy  urine. Vaginal discharge, if you are female. Pain in the abdomen or the lower back. You may also have: Vomiting or a decreased appetite. Confusion. Irritability or  tiredness. A fever or chills. Diarrhea. The first symptom in older adults may be confusion. In some cases, they may not have any symptoms until the infection has worsened. How is this diagnosed? This condition is diagnosed based on your medical history and a physical exam. You may also have other tests, including: Urine tests. Blood tests. Tests for STIs (sexually transmitted infections). If you have had more than one UTI, a cystoscopy or imaging studies may be done to determine the cause of the infections. How is this treated? Treatment for this condition includes: Antibiotic medicine. Over-the-counter medicines to treat discomfort. Drinking enough water to stay hydrated. If you have frequent infections or have other conditions such as a kidney stone, you may need to see a health care provider who specializes in the urinary tract (urologist). In rare cases, urinary tract infections can cause sepsis. Sepsis is a life-threatening condition that occurs when the body responds to an infection. Sepsis is treated in the hospital with IV antibiotics, fluids, and other medicines. Follow these instructions at home: Medicines Take over-the-counter and prescription medicines only as told by your health care provider. If you were prescribed an antibiotic medicine, take it as told by your health care provider. Do not stop using the antibiotic even if you start to feel better. General instructions Make sure you: Empty your bladder often and completely. Do not hold urine for long periods of time. Empty your bladder after sex. Wipe from front to back after urinating or having a bowel movement if you are female. Use each tissue only one time when you wipe. Drink enough fluid to keep your urine pale yellow. Keep all follow-up visits. This is important. Contact a health care provider if: Your symptoms do not get better after 1-2 days. Your symptoms go away and then return. Get help right away if: You  have severe pain in your back or your lower abdomen. You have a fever or chills. You have nausea or vomiting. Summary A urinary tract infection (UTI) is an infection of any part of the urinary tract, which includes the kidneys, ureters, bladder, and urethra. Most urinary tract infections are caused by bacteria in your genital area. Treatment for this condition often includes antibiotic medicines. If you were prescribed an antibiotic medicine, take it as told by your health care provider. Do not stop using the antibiotic even if you start to feel better. Keep all follow-up visits. This is important. This information is not intended to replace advice given to you by your health care provider. Make sure you discuss any questions you have with your health care provider. Document Revised: 03/18/2020 Document Reviewed: 03/18/2020 Elsevier Patient Education  [place].

## 2021-10-16 ENCOUNTER — Telehealth: Payer: Self-pay

## 2021-10-16 DIAGNOSIS — N1832 Chronic kidney disease, stage 3b: Secondary | ICD-10-CM | POA: Diagnosis not present

## 2021-10-16 DIAGNOSIS — E1122 Type 2 diabetes mellitus with diabetic chronic kidney disease: Secondary | ICD-10-CM | POA: Diagnosis not present

## 2021-10-16 DIAGNOSIS — I69354 Hemiplegia and hemiparesis following cerebral infarction affecting left non-dominant side: Secondary | ICD-10-CM | POA: Diagnosis not present

## 2021-10-16 DIAGNOSIS — E11319 Type 2 diabetes mellitus with unspecified diabetic retinopathy without macular edema: Secondary | ICD-10-CM | POA: Diagnosis not present

## 2021-10-16 DIAGNOSIS — R531 Weakness: Secondary | ICD-10-CM | POA: Diagnosis not present

## 2021-10-16 DIAGNOSIS — N2581 Secondary hyperparathyroidism of renal origin: Secondary | ICD-10-CM | POA: Diagnosis not present

## 2021-10-16 DIAGNOSIS — N184 Chronic kidney disease, stage 4 (severe): Secondary | ICD-10-CM | POA: Diagnosis not present

## 2021-10-16 DIAGNOSIS — I129 Hypertensive chronic kidney disease with stage 1 through stage 4 chronic kidney disease, or unspecified chronic kidney disease: Secondary | ICD-10-CM | POA: Diagnosis not present

## 2021-10-16 DIAGNOSIS — R600 Localized edema: Secondary | ICD-10-CM | POA: Diagnosis not present

## 2021-10-16 DIAGNOSIS — I10 Essential (primary) hypertension: Secondary | ICD-10-CM | POA: Diagnosis not present

## 2021-10-16 DIAGNOSIS — N390 Urinary tract infection, site not specified: Secondary | ICD-10-CM | POA: Diagnosis not present

## 2021-10-16 DIAGNOSIS — R69 Illness, unspecified: Secondary | ICD-10-CM | POA: Diagnosis not present

## 2021-10-16 DIAGNOSIS — R809 Proteinuria, unspecified: Secondary | ICD-10-CM | POA: Diagnosis not present

## 2021-10-16 DIAGNOSIS — D631 Anemia in chronic kidney disease: Secondary | ICD-10-CM | POA: Diagnosis not present

## 2021-10-16 NOTE — Telephone Encounter (Signed)
Desharia w/[facility] called to request verbal orders for OT 1xw/5w. Verbal approval given.

## 2021-10-17 ENCOUNTER — Other Ambulatory Visit: Payer: Self-pay | Admitting: Family Medicine

## 2021-10-17 DIAGNOSIS — E1122 Type 2 diabetes mellitus with diabetic chronic kidney disease: Secondary | ICD-10-CM | POA: Diagnosis not present

## 2021-10-17 DIAGNOSIS — R531 Weakness: Secondary | ICD-10-CM | POA: Diagnosis not present

## 2021-10-17 DIAGNOSIS — E11319 Type 2 diabetes mellitus with unspecified diabetic retinopathy without macular edema: Secondary | ICD-10-CM | POA: Diagnosis not present

## 2021-10-17 DIAGNOSIS — I10 Essential (primary) hypertension: Secondary | ICD-10-CM

## 2021-10-17 DIAGNOSIS — E1165 Type 2 diabetes mellitus with hyperglycemia: Secondary | ICD-10-CM

## 2021-10-17 DIAGNOSIS — N184 Chronic kidney disease, stage 4 (severe): Secondary | ICD-10-CM | POA: Diagnosis not present

## 2021-10-17 DIAGNOSIS — D631 Anemia in chronic kidney disease: Secondary | ICD-10-CM | POA: Diagnosis not present

## 2021-10-17 DIAGNOSIS — I129 Hypertensive chronic kidney disease with stage 1 through stage 4 chronic kidney disease, or unspecified chronic kidney disease: Secondary | ICD-10-CM | POA: Diagnosis not present

## 2021-10-17 DIAGNOSIS — R69 Illness, unspecified: Secondary | ICD-10-CM | POA: Diagnosis not present

## 2021-10-17 DIAGNOSIS — I69354 Hemiplegia and hemiparesis following cerebral infarction affecting left non-dominant side: Secondary | ICD-10-CM | POA: Diagnosis not present

## 2021-10-17 DIAGNOSIS — N390 Urinary tract infection, site not specified: Secondary | ICD-10-CM | POA: Diagnosis not present

## 2021-10-18 ENCOUNTER — Other Ambulatory Visit: Payer: Self-pay

## 2021-10-18 DIAGNOSIS — R531 Weakness: Secondary | ICD-10-CM | POA: Diagnosis not present

## 2021-10-18 DIAGNOSIS — I129 Hypertensive chronic kidney disease with stage 1 through stage 4 chronic kidney disease, or unspecified chronic kidney disease: Secondary | ICD-10-CM | POA: Diagnosis not present

## 2021-10-18 DIAGNOSIS — N184 Chronic kidney disease, stage 4 (severe): Secondary | ICD-10-CM | POA: Diagnosis not present

## 2021-10-18 DIAGNOSIS — N390 Urinary tract infection, site not specified: Secondary | ICD-10-CM | POA: Diagnosis not present

## 2021-10-18 DIAGNOSIS — E1122 Type 2 diabetes mellitus with diabetic chronic kidney disease: Secondary | ICD-10-CM | POA: Diagnosis not present

## 2021-10-18 DIAGNOSIS — D631 Anemia in chronic kidney disease: Secondary | ICD-10-CM | POA: Diagnosis not present

## 2021-10-18 DIAGNOSIS — I69354 Hemiplegia and hemiparesis following cerebral infarction affecting left non-dominant side: Secondary | ICD-10-CM | POA: Diagnosis not present

## 2021-10-18 DIAGNOSIS — R69 Illness, unspecified: Secondary | ICD-10-CM | POA: Diagnosis not present

## 2021-10-18 DIAGNOSIS — E11319 Type 2 diabetes mellitus with unspecified diabetic retinopathy without macular edema: Secondary | ICD-10-CM | POA: Diagnosis not present

## 2021-10-23 DIAGNOSIS — R531 Weakness: Secondary | ICD-10-CM | POA: Diagnosis not present

## 2021-10-23 DIAGNOSIS — N390 Urinary tract infection, site not specified: Secondary | ICD-10-CM | POA: Diagnosis not present

## 2021-10-23 DIAGNOSIS — I69354 Hemiplegia and hemiparesis following cerebral infarction affecting left non-dominant side: Secondary | ICD-10-CM | POA: Diagnosis not present

## 2021-10-23 DIAGNOSIS — D631 Anemia in chronic kidney disease: Secondary | ICD-10-CM | POA: Diagnosis not present

## 2021-10-23 DIAGNOSIS — I129 Hypertensive chronic kidney disease with stage 1 through stage 4 chronic kidney disease, or unspecified chronic kidney disease: Secondary | ICD-10-CM | POA: Diagnosis not present

## 2021-10-23 DIAGNOSIS — R69 Illness, unspecified: Secondary | ICD-10-CM | POA: Diagnosis not present

## 2021-10-23 DIAGNOSIS — E11319 Type 2 diabetes mellitus with unspecified diabetic retinopathy without macular edema: Secondary | ICD-10-CM | POA: Diagnosis not present

## 2021-10-23 DIAGNOSIS — E1122 Type 2 diabetes mellitus with diabetic chronic kidney disease: Secondary | ICD-10-CM | POA: Diagnosis not present

## 2021-10-23 DIAGNOSIS — N184 Chronic kidney disease, stage 4 (severe): Secondary | ICD-10-CM | POA: Diagnosis not present

## 2021-10-25 ENCOUNTER — Telehealth: Payer: Self-pay

## 2021-10-25 DIAGNOSIS — R062 Wheezing: Secondary | ICD-10-CM | POA: Diagnosis not present

## 2021-10-25 NOTE — Telephone Encounter (Signed)
Dasharia, OT w/[facility] called to advise patient's caregiver has decided to discontinue OT services at this time.  ? ?FYI, thanks! ?

## 2021-10-27 ENCOUNTER — Other Ambulatory Visit: Payer: Self-pay

## 2021-10-27 ENCOUNTER — Encounter: Payer: Self-pay | Admitting: Family Medicine

## 2021-10-27 ENCOUNTER — Ambulatory Visit (INDEPENDENT_AMBULATORY_CARE_PROVIDER_SITE_OTHER): Payer: Medicare HMO | Admitting: Family Medicine

## 2021-10-27 DIAGNOSIS — E11319 Type 2 diabetes mellitus with unspecified diabetic retinopathy without macular edema: Secondary | ICD-10-CM | POA: Diagnosis not present

## 2021-10-27 MED ORDER — FREESTYLE LIBRE 2 SENSOR MISC: 1.0000 | 5 refills | Status: DC

## 2021-10-27 MED ORDER — FREESTYLE LIBRE 2 READER DEVI: 1.0000 | 1 refills | Status: DC

## 2021-10-27 NOTE — Progress Notes (Signed)
Subjective:    Patient ID: Renee [name], female    DOB: 27-May-1929, 88 y.o.   MRN: [id number] Admit date: 08/29/2021 Discharge date: 09/01/2021   Admitted From: home Discharge disposition: SNF- short term     Recommendations for Outpatient Follow-Up:    Adjust BP meds as needed-- would avoid BB due to bradycardia BMP 1 week     Discharge Diagnosis:    Principal Problem:   Weakness secondary to UTI Active Problems:   Type 2 diabetes, controlled, with retinopathy ([place])   Essential hypertension   Hemiplegia, late effect of cerebrovascular disease ([place])   Anxiety and depression   Sleep apnea   CKD (chronic kidney disease) stage 4, GFR 15-29 ml/min (HCC)   Normocytic anemia   UTI (urinary tract infection)   Wheezing on expiration   Bradycardia       Discharge Condition: Improved.   Diet recommendation: Low sodium, heart healthy.  Carbohydrate-modified.    Wound care: None.   Code status: DNR     History of Present Illness:    Renee [name] is a 88 y.o. female with medical history significant of T2DM, HTN, HLD, hx of CVA with residual left weakness, OSA, CKD stage IV, anxiety and depression who presents to ED with weakness. She states she didn't feel good last night and felt "hot." She didn't feel right. When she got up this AM she thought her feet felt weird and called EMS so she was brought to the ED. Denies any unilateral weakness, slurred speech or droopy face. She denies any confusion.    She denies any fever/chills, no headache or vision changes, no chest pain or palpitations, no shortness of breath, occasional cough, no stomach pain, no N/V/D. She has no dysuria, but has increased frequency and urgency. No blood in her urine and no CVA tenderness. She has mild leg swelling, at baseline. She has been eating and drinking well.    She needs assistance with ADLs. She does not drive.      Hospital Course by Problem:    Weakness secondary to UTI -work up  unremarkable except for UA suspicious for infection -rocephin, culture appears to have never been sent- treated for 3 days -CT head negative for acute event -TSH recently checked and wnl  -d/c BB -home caregivers with COVID- plan to d/c home once caregiver recovered   Wheezing on expiration- (present on admission) Patient with wheezing on exam, but denies any shortness of breath/cough No history of copd, but she tells me she had asthma as a child -albuterol prn -continue to follow clinically  -had wheezing in November when admitted for viral infection.    Type 2 diabetes, controlled, with retinopathy ([place])- (present on admission) a1c 8.3 on 05/2021 -d/c actos   Essential hypertension- (present on admission) Continue home medication- adjust as needed -hold BB due to bradycardia   Anxiety and depression- (present on admission) Stable continue zoloft, remeron and xanax prn    CKD (chronic kidney disease) stage 4, GFR 15-29 ml/min (HCC)- (present on admission) At baseline (1.35-1.77), continue to monitor  -stable   Hemiplegia, late effect of cerebrovascular disease ([place]) At baseline, CTH with no acute finding Continue ASA    Normocytic anemia- (present on admission) Appears at baseline, continue to monitor  -? From ckd   Sleep apnea- (present on admission) -No longer on cpap    obesity Body mass index is 32.77 kg/m.  09/19/21 Patient is here today for follow-up.  She states she  feels back to her baseline.  She went to the emergency room because she was lightheaded dizzy and confused.  In the emergency room she was treated for possible UTI.  She denies any dysuria.  She is back on Keflex 500 mg daily as a preventative given the frequency and severity of her urinary tract infections.  She is not using her topical estradiol.  She was not sure what this was for.  I explained to her that atrophic vaginitis can increase the frequency of bladder infections and therefore we are using  the topical estrogen to try to reduce the frequency of bladder infections.  Apparently at the hospital she was wheezing on physical exam.  Today her lungs are clear.  She states that she uses her albuterol less than once a week.  She states that her breathing feels fine on.  Her blood pressure today is slightly elevated.  She denies any chest pain.  She denies any shortness of breath.  She is checking her sugars quite regularly.  They are typically around 150.  Given her age and her frail situation, my goal for this patient is to keep her blood sugars between 100-200.  She states that she seldom sees her blood sugar over 200.  Her last A1c was 8.2 in October.  She is due to recheck that today.  She also has chronic kidney disease.  She denies any dehydration.  She states that she feels normal.  She denies any lightheadedness or dizziness.  She denies any diarrhea or constipation.  There is no leg swelling.  There is no evidence of fluid overload.  At that time, my plan was: First, regarding her wheezing, she has mild intermittent asthma.  At the present time using albuterol sparingly as needed is sufficient.  Does not require a daily inhaler.  Second regarding her frequent urinary tract infections.  I suspect that some of this may be bacteriuria due to colonization.  Whenever she feels weak or tired, people are checking her urine sample and finding bacteria.  However often she does not have symptoms such as dysuria.  Therefore I would try to avoid treating asymptomatic bacteriuria unless the patient has dysuria, an elevated white count, abdominal pain, or fevers.  However I have encouraged her to resume her Keflex 500 mg daily preventative medication and also to add Estrace vaginal cream daily applied to the vagina to help reduce the frequency of bladder infections.  Repeat CBC and CMP today to monitor renal function.  Check an A1c.  Goal A1c is less than 8.  10/27/21 At last ov, A1c was 8 and I recommended  adding actos 30 mg poqday and recheck in 3 months.  Patient is here today with her caregiver.  She denies any dysuria.  She denies any frequency or urgency.  She has been using the vaginal estrogen cream without any difficulty.  She denies any cough.  She denies any chest pain or shortness of breath.  She has very little swelling in her legs.  She has been consistently taking pioglitazone.  However her blood sugars are typically around 180 in the morning fasting.  She denies any hypoglycemic episodes.  She is not checking her sugar later in the day Past Medical History:  Diagnosis Date   Angioedema    possibly from voltaren   Bronchopneumonia 12/11/2016   Degenerative disc disease    Diabetes mellitus    type II   Hyperlipidemia    Hypertension    LVH (left  ventricular hypertrophy)    and atrial enlargement by echo in past with nl EF   Nasal pruritis    Osteoarthritis    Osteopenia    Renal insufficiency    Sleep apnea    Stroke ([place]) 05/2010   Small vessel sobcortical (in [place]) with Dr Leonie Man, residual L hemiparesis   Vitamin B 12 deficiency 04/08   Past Surgical History:  Procedure Laterality Date   ABDOMINAL HYSTERECTOMY     BSO-fibroids   APPENDECTOMY     BACK SURGERY     COLON SURGERY     due to punctured intestines   EYE SURGERY     cataract extraction   IR THORACENTESIS ASP PLEURAL SPACE W/IMG GUIDE  06/07/2020   KNEE SURGERY     arthroscope   PARS PLANA VITRECTOMY  07/31/2011   Procedure: PARS PLANA VITRECTOMY WITH 25 GAUGE;  Surgeon: [name], MD;  Location: [facility];  Service: Ophthalmology;  Laterality: Right;  REMOVAL OF SILICONE OIL AND LASER RIGHT EYE   RETINAL DETACHMENT SURGERY  02/18/11   times 2   SPINE SURGERY  08/09   spinal decompression surgery   Current Outpatient Medications on File Prior to Visit  Medication Sig Dispense Refill   acetaminophen (TYLENOL) 500 MG tablet Take 1,000 mg by mouth at bedtime.     albuterol (VENTOLIN HFA) 108 (90  Base) MCG/ACT inhaler INHALE 2 PUFFS INTO THE LUNGS EVERY 4 HOURS AS NEEDED FOR WHEEZING/SHORTNESS OF BREATH (Patient taking differently: Inhale 2 puffs into the lungs every 4 (four) hours as needed for shortness of breath or wheezing.) 18 each 3   ALPRAZolam (XANAX) 0.5 MG tablet Take 1 tablet (0.5 mg total) by mouth 2 (two) times daily. TAKE 1/2 TO 1 TABLET BY MOUTH TWICE A DAY AS NEEDED FOR ANXIETY Strength: 0.5 mg 20 tablet 0   amLODipine (NORVASC) 10 MG tablet TAKE 1 TABLET DAILY 90 tablet 3   aspirin EC 325 MG tablet Take 325 mg by mouth in the morning.     Blood Glucose Monitoring Suppl (ONE TOUCH ULTRA 2) w/Device KIT Check blood sugar once daily and as directed. Dx E11.9 1 each 0   calcium-vitamin D (OSCAL WITH D) 500-200 MG-UNIT per tablet Take 1 tablet by mouth daily.     cephALEXin (KEFLEX) 500 MG capsule Take 1 capsule (500 mg total) by mouth daily. (Patient not taking: Reported on 10/13/2021) 30 capsule 3   Cholecalciferol (VITAMIN D-3) 25 MCG (1000 UT) CAPS Take 1,000 Units by mouth in the morning.     Continuous Blood Gluc Receiver (FREESTYLE LIBRE 2 READER) DEVI 1 each by Does not apply route as directed. 1 each 1   Continuous Blood Gluc Sensor (FREESTYLE LIBRE 2 SENSOR) MISC 1 each by Does not apply route as directed. 1 each 5   Cranberry 500 MG TABS Take 500 mg by mouth in the morning and at bedtime.     docusate sodium (COLACE) 100 MG capsule Take 200 mg by mouth at bedtime.     estradiol (ESTRACE) 0.1 MG/GM vaginal cream PLEASE SEE ATTACHED FOR DETAILED DIRECTIONS     FLONASE 50 MCG/ACT nasal spray Place 2 sprays into both nostrils See admin instructions. Instill 2 sprays into each nostril at bedtime when not using Nasacort     furosemide (LASIX) 20 MG tablet Take by mouth.     glipiZIDE (GLUCOTROL XL) 5 MG 24 hr tablet Take 5 mg by mouth daily.     glucose blood (  ONETOUCH ULTRA) test strip CHECK BLOOD SUGAR ONCE DAILY AND AS DIRECTED. 100 strip 11   hydrALAZINE (APRESOLINE) 100  MG tablet Take 1 tablet (100 mg total) by mouth 3 (three) times daily. 270 tablet 3   hydrochlorothiazide (MICROZIDE) 12.5 MG capsule Take 1 capsule (12.5 mg total) by mouth daily. 30 capsule 3   Lancets (ONETOUCH DELICA PLUS TSVXBL39Q) MISC CHECK BLOOD SUGAR ONCE DAILY AND AS DIRECTED 100 each 2   metoprolol succinate (TOPROL-XL) 25 MG 24 hr tablet Take 12.5 mg by mouth 2 (two) times daily.     mirtazapine (REMERON) 15 MG tablet Take 1 tablet (15 mg total) by mouth at bedtime. 90 tablet 3   Multiple Vitamin (MULTIVITAMIN) capsule Take 1 capsule by mouth daily.     Multiple Vitamins-Minerals (PRESERVISION AREDS 2+MULTI VIT) CAPS Take 1 capsule by mouth in the morning and at bedtime.     Olopatadine HCl (PATADAY OP) Place 1 drop into both eyes 2 (two) times daily as needed (for itching or redness).     pioglitazone (ACTOS) 30 MG tablet Take 1 tablet (30 mg total) by mouth daily. 90 tablet 3   sertraline (ZOLOFT) 50 MG tablet TAKE 1 TABLET BY MOUTH EVERY DAY 90 tablet 1   sitaGLIPtin (JANUVIA) 100 MG tablet Take 1 tablet (100 mg total) by mouth daily. 90 tablet 3   trandolapril (MAVIK) 4 MG tablet Take 1 tablet (4 mg total) by mouth daily. 90 tablet 3   triamcinolone (NASACORT) 55 MCG/ACT AERO nasal inhaler Place 2 sprays into the nose daily. (Patient taking differently: Place 2 sprays into the nose See admin instructions. Instill 2 sprays into each nostril at bedtime when not using Flonase) 1 each 5   vitamin B-12 (CYANOCOBALAMIN) 1000 MCG tablet Take 1,000 mcg by mouth daily.      No current facility-administered medications on file prior to visit.   Allergies  Allergen Reactions   Tape Other (See Comments)    The patient's skin is VERY THIN- will TEAR EASILY!!   Diclofenac Sodium Swelling and Other (See Comments)    Angioedema   Pioglitazone Swelling and Other (See Comments)    Re-started in 2022   Social History   Socioeconomic History   Marital status: Widowed    Spouse name: Not on  file   Number of children: Not on file   Years of education: Not on file   Highest education level: Not on file  Occupational History   Not on file  Tobacco Use   Smoking status: Never   Smokeless tobacco: Never  Vaping Use   Vaping Use: Not on file  Substance and Sexual Activity   Alcohol use: No    Alcohol/week: 0.0 standard drinks   Drug use: No   Sexual activity: Never  Other Topics Concern   Not on file  Social History Narrative   Not on file   Social Determinants of Health   Financial Resource Strain: Low Risk    Difficulty of Paying Living Expenses: Not hard at all  Food Insecurity: No Food Insecurity   Worried About Charity fundraiser in the Last Year: Never true   [place] in the Last Year: Never true  Transportation Needs: No Transportation Needs   Lack of Transportation (Medical): No   Lack of Transportation (Non-Medical): No  Physical Activity: Inactive   Days of Exercise per Week: 0 days   Minutes of Exercise per Session: 0 min  Stress: No Stress Concern Present  Feeling of Stress : Not at all  Social Connections: Socially Isolated   Frequency of Communication with Friends and Family: More than three times a week   Frequency of Social Gatherings with Friends and Family: Once a week   Attends Religious Services: Never   Marine scientist or Organizations: No   Attends Archivist Meetings: Never   Marital Status: Widowed  Human resources officer Violence: Not At Risk   Fear of Current or Ex-Partner: No   Emotionally Abused: No   Physically Abused: No   Sexually Abused: No      Review of Systems  Musculoskeletal:  Positive for back pain.  All other systems reviewed and are negative.     Objective:   Physical Exam Vitals reviewed.  Constitutional:      General: She is not in acute distress.    Appearance: Normal appearance. She is obese. She is not ill-appearing, toxic-appearing or diaphoretic.  HENT:     Right Ear: Tympanic  membrane and ear canal normal.     Left Ear: Tympanic membrane and ear canal normal.     Nose: Nose normal. No congestion or rhinorrhea.  Cardiovascular:     Rate and Rhythm: Normal rate and regular rhythm.     Heart sounds: Normal heart sounds. No murmur heard.   No friction rub. No gallop.  Pulmonary:     Effort: Pulmonary effort is normal. No tachypnea or respiratory distress.     Breath sounds: Normal breath sounds. No stridor or decreased air movement. No wheezing, rhonchi or rales.  Musculoskeletal:     Right lower leg: No edema.     Left lower leg: No edema.  Neurological:     Mental Status: She is alert and oriented to person, place, and time. Mental status is at baseline.     Sensory: No sensory deficit.     Motor: Weakness present.     Coordination: Coordination abnormal.     Gait: Gait abnormal.  Chronic left facial droop due to her previous remote right basal ganglial infarct        Assessment & Plan:  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, without long-term current use of insulin, macular edema presence unspecified, unspecified retinopathy severity ([place]) - Plan: Continuous Blood Gluc Sensor (FREESTYLE LIBRE 2 SENSOR) MISC, Continuous Blood Gluc Receiver (FREESTYLE LIBRE 2 READER) DEVI, CBC with Differential/Platelet, COMPLETE METABOLIC PANEL WITH GFR, Hemoglobin A1c Blood sugars sound like they are poorly controlled.  I will repeat an A1c today.  If still 8 or higher, I plan to discontinue Januvia and start the patient on Trulicity as I feel that this will be more effective for her.  We discussed possibly starting insulin however I am concerned about hypoglycemia in this patient.  Blood pressure today is acceptable.  Check CBC and CMP.

## 2021-10-28 ENCOUNTER — Other Ambulatory Visit: Payer: Self-pay | Admitting: Family Medicine

## 2021-10-28 LAB — COMPLETE METABOLIC PANEL WITH GFR
AG Ratio: 2 (calc) (ref 1.0–2.5)
ALT: 9 U/L (ref 6–29)
AST: 13 U/L (ref 10–35)
Albumin: 3.6 g/dL (ref 3.6–5.1)
Alkaline phosphatase (APISO): 66 U/L (ref 37–153)
BUN/Creatinine Ratio: 21 (calc) (ref 6–22)
BUN: 27 mg/dL — ABNORMAL HIGH (ref 7–25)
CO2: 30 mmol/L (ref 20–32)
Calcium: 8.8 mg/dL (ref 8.6–10.4)
Chloride: 103 mmol/L (ref 98–110)
Creat: 1.28 mg/dL — ABNORMAL HIGH (ref 0.60–0.95)
Globulin: 1.8 g/dL (calc) — ABNORMAL LOW (ref 1.9–3.7)
Glucose, Bld: 215 mg/dL — ABNORMAL HIGH (ref 65–99)
Potassium: 4.3 mmol/L (ref 3.5–5.3)
Sodium: 140 mmol/L (ref 135–146)
Total Bilirubin: 0.4 mg/dL (ref 0.2–1.2)
Total Protein: 5.4 g/dL — ABNORMAL LOW (ref 6.1–8.1)
eGFR: 40 mL/min/{1.73_m2} — ABNORMAL LOW (ref >=60)

## 2021-10-28 LAB — CBC WITH DIFFERENTIAL/PLATELET
Absolute Monocytes: 377 cells/uL (ref 200–950)
Basophils Absolute: 51 cells/uL (ref 0–200)
Basophils Relative: 1 %
Eosinophils Absolute: 148 cells/uL (ref 15–500)
Eosinophils Relative: 2.9 %
HCT: 36 % (ref 35.0–45.0)
Hemoglobin: 11.2 g/dL — ABNORMAL LOW (ref 11.7–15.5)
Lymphs Abs: 821 cells/uL — ABNORMAL LOW (ref 850–3900)
MCH: 27 pg (ref 27.0–33.0)
MCHC: 31.1 g/dL — ABNORMAL LOW (ref 32.0–36.0)
MCV: 86.7 fL (ref 80.0–100.0)
MPV: 11.4 fL (ref 7.5–12.5)
Monocytes Relative: 7.4 %
Neutro Abs: 3703 cells/uL (ref 1500–7800)
Neutrophils Relative %: 72.6 %
Platelets: 160 10*3/uL (ref 140–400)
RBC: 4.15 10*6/uL (ref 3.80–5.10)
RDW: 13.4 % (ref 11.0–15.0)
Total Lymphocyte: 16.1 %
WBC: 5.1 10*3/uL (ref 3.8–10.8)

## 2021-10-28 LAB — HEMOGLOBIN A1C
Hgb A1c MFr Bld: 7.1 % of total Hgb — ABNORMAL HIGH (ref <5.7)
Mean Plasma Glucose: 157 mg/dL
eAG (mmol/L): 8.7 mmol/L

## 2021-11-01 ENCOUNTER — Telehealth: Payer: Self-pay

## 2021-11-01 DIAGNOSIS — N184 Chronic kidney disease, stage 4 (severe): Secondary | ICD-10-CM | POA: Diagnosis not present

## 2021-11-01 DIAGNOSIS — E1122 Type 2 diabetes mellitus with diabetic chronic kidney disease: Secondary | ICD-10-CM | POA: Diagnosis not present

## 2021-11-01 DIAGNOSIS — I69354 Hemiplegia and hemiparesis following cerebral infarction affecting left non-dominant side: Secondary | ICD-10-CM | POA: Diagnosis not present

## 2021-11-01 DIAGNOSIS — D631 Anemia in chronic kidney disease: Secondary | ICD-10-CM | POA: Diagnosis not present

## 2021-11-01 DIAGNOSIS — R69 Illness, unspecified: Secondary | ICD-10-CM | POA: Diagnosis not present

## 2021-11-01 DIAGNOSIS — R531 Weakness: Secondary | ICD-10-CM | POA: Diagnosis not present

## 2021-11-01 DIAGNOSIS — E11319 Type 2 diabetes mellitus with unspecified diabetic retinopathy without macular edema: Secondary | ICD-10-CM | POA: Diagnosis not present

## 2021-11-01 DIAGNOSIS — N390 Urinary tract infection, site not specified: Secondary | ICD-10-CM | POA: Diagnosis not present

## 2021-11-01 DIAGNOSIS — I129 Hypertensive chronic kidney disease with stage 1 through stage 4 chronic kidney disease, or unspecified chronic kidney disease: Secondary | ICD-10-CM | POA: Diagnosis not present

## 2021-11-01 NOTE — Telephone Encounter (Signed)
Patient's caregiver advises patient has been taking 1 tab Xanax each morning and evening. She is concerned that patient is now sleeping too much. She states patient is demanding to continue this medication dose, even though it is written as PRN. She would like to know what you recommend. ? ?Please advise, thank you! ?

## 2021-11-02 NOTE — Telephone Encounter (Signed)
LMTRC

## 2021-11-03 DIAGNOSIS — R69 Illness, unspecified: Secondary | ICD-10-CM | POA: Diagnosis not present

## 2021-11-03 DIAGNOSIS — I69354 Hemiplegia and hemiparesis following cerebral infarction affecting left non-dominant side: Secondary | ICD-10-CM | POA: Diagnosis not present

## 2021-11-03 DIAGNOSIS — E1122 Type 2 diabetes mellitus with diabetic chronic kidney disease: Secondary | ICD-10-CM | POA: Diagnosis not present

## 2021-11-03 DIAGNOSIS — R531 Weakness: Secondary | ICD-10-CM | POA: Diagnosis not present

## 2021-11-03 DIAGNOSIS — N184 Chronic kidney disease, stage 4 (severe): Secondary | ICD-10-CM | POA: Diagnosis not present

## 2021-11-03 DIAGNOSIS — I129 Hypertensive chronic kidney disease with stage 1 through stage 4 chronic kidney disease, or unspecified chronic kidney disease: Secondary | ICD-10-CM | POA: Diagnosis not present

## 2021-11-03 DIAGNOSIS — N390 Urinary tract infection, site not specified: Secondary | ICD-10-CM | POA: Diagnosis not present

## 2021-11-03 DIAGNOSIS — E11319 Type 2 diabetes mellitus with unspecified diabetic retinopathy without macular edema: Secondary | ICD-10-CM | POA: Diagnosis not present

## 2021-11-03 DIAGNOSIS — D631 Anemia in chronic kidney disease: Secondary | ICD-10-CM | POA: Diagnosis not present

## 2021-11-03 NOTE — Telephone Encounter (Signed)
Spoke with patient's caregiver and gave recommendation of Xanax QHS only. She voiced understanding. She also explained this to the patient while I was on the phone, patient also voiced understanding. Nothing further needed at this time.  ? ?

## 2021-11-08 DIAGNOSIS — R531 Weakness: Secondary | ICD-10-CM | POA: Diagnosis not present

## 2021-11-08 DIAGNOSIS — N390 Urinary tract infection, site not specified: Secondary | ICD-10-CM | POA: Diagnosis not present

## 2021-11-08 DIAGNOSIS — N184 Chronic kidney disease, stage 4 (severe): Secondary | ICD-10-CM | POA: Diagnosis not present

## 2021-11-08 DIAGNOSIS — R69 Illness, unspecified: Secondary | ICD-10-CM | POA: Diagnosis not present

## 2021-11-08 DIAGNOSIS — E11319 Type 2 diabetes mellitus with unspecified diabetic retinopathy without macular edema: Secondary | ICD-10-CM | POA: Diagnosis not present

## 2021-11-08 DIAGNOSIS — E1122 Type 2 diabetes mellitus with diabetic chronic kidney disease: Secondary | ICD-10-CM | POA: Diagnosis not present

## 2021-11-08 DIAGNOSIS — I69354 Hemiplegia and hemiparesis following cerebral infarction affecting left non-dominant side: Secondary | ICD-10-CM | POA: Diagnosis not present

## 2021-11-08 DIAGNOSIS — D631 Anemia in chronic kidney disease: Secondary | ICD-10-CM | POA: Diagnosis not present

## 2021-11-08 DIAGNOSIS — I129 Hypertensive chronic kidney disease with stage 1 through stage 4 chronic kidney disease, or unspecified chronic kidney disease: Secondary | ICD-10-CM | POA: Diagnosis not present

## 2021-11-09 ENCOUNTER — Telehealth: Payer: Self-pay

## 2021-11-09 NOTE — Telephone Encounter (Signed)
Pt's caregiver called stated that pt is done with her physical therapy sessions. However, she feels that pt could still use more therapy due weakness.  ? ?Please advice ?

## 2021-11-10 NOTE — Telephone Encounter (Signed)
Try calling pt with no answer or VM ?

## 2021-11-10 NOTE — Telephone Encounter (Signed)
Pt caregiver [name] called re extra therapy, told her that Dr. Dennard Schaumann can order for additional therapy, however,  that pt would have to pay out of pocket.  ? ?Per pt caregiver, pt has a stomach bug, diarrhea. Would like to know if something could be call in for her. ? ?Please advice  ?

## 2021-11-12 ENCOUNTER — Other Ambulatory Visit: Payer: Self-pay | Admitting: Family Medicine

## 2021-11-13 NOTE — Telephone Encounter (Signed)
Call pt and advice to take imodium otc for diarrhea per Dr. Dennard Schaumann ?

## 2021-11-18 ENCOUNTER — Other Ambulatory Visit: Payer: Self-pay | Admitting: Family Medicine

## 2021-11-20 ENCOUNTER — Other Ambulatory Visit: Payer: Self-pay | Admitting: Family Medicine

## 2021-11-20 ENCOUNTER — Telehealth: Payer: Self-pay

## 2021-11-20 ENCOUNTER — Ambulatory Visit (INDEPENDENT_AMBULATORY_CARE_PROVIDER_SITE_OTHER): Payer: Medicare HMO | Admitting: *Deleted

## 2021-11-20 ENCOUNTER — Other Ambulatory Visit: Payer: Self-pay

## 2021-11-20 DIAGNOSIS — E1165 Type 2 diabetes mellitus with hyperglycemia: Secondary | ICD-10-CM

## 2021-11-20 DIAGNOSIS — I10 Essential (primary) hypertension: Secondary | ICD-10-CM | POA: Diagnosis not present

## 2021-11-20 DIAGNOSIS — N3941 Urge incontinence: Secondary | ICD-10-CM | POA: Diagnosis not present

## 2021-11-20 DIAGNOSIS — H547 Unspecified visual loss: Secondary | ICD-10-CM | POA: Diagnosis not present

## 2021-11-20 DIAGNOSIS — Z7409 Other reduced mobility: Secondary | ICD-10-CM | POA: Diagnosis not present

## 2021-11-20 DIAGNOSIS — R69 Illness, unspecified: Secondary | ICD-10-CM | POA: Diagnosis not present

## 2021-11-20 DIAGNOSIS — I69334 Monoplegia of upper limb following cerebral infarction affecting left non-dominant side: Secondary | ICD-10-CM | POA: Diagnosis not present

## 2021-11-20 DIAGNOSIS — Z6830 Body mass index (BMI) 30.0-30.9, adult: Secondary | ICD-10-CM | POA: Diagnosis not present

## 2021-11-20 DIAGNOSIS — Z008 Encounter for other general examination: Secondary | ICD-10-CM | POA: Diagnosis not present

## 2021-11-20 DIAGNOSIS — K5900 Constipation, unspecified: Secondary | ICD-10-CM | POA: Diagnosis not present

## 2021-11-20 DIAGNOSIS — Z7984 Long term (current) use of oral hypoglycemic drugs: Secondary | ICD-10-CM | POA: Diagnosis not present

## 2021-11-20 DIAGNOSIS — E669 Obesity, unspecified: Secondary | ICD-10-CM | POA: Diagnosis not present

## 2021-11-20 NOTE — Telephone Encounter (Signed)
Msg received from [place], [place] at [facility]: ?Pt is being D/C from agency today. If have any questions call [phone] ?

## 2021-11-20 NOTE — Patient Instructions (Signed)
Visit Information ? ?Thank you for taking time to visit with me today. Please don't hesitate to contact me if I can be of assistance to you before our next scheduled telephone appointment. ? ?Following are the goals we discussed today:  ?check blood sugar at prescribed times: once daily ?trim toenails straight across ?drink 6 to 8 glasses of water each day ?fill half of plate with vegetables ?read food labels for fat, fiber, carbohydrates and portion size ?keep feet up while sitting ?wash and dry feet carefully every day ?wear comfortable, cotton socks ?wear comfortable, well-fitting shoes ?check blood pressure daily ?write blood pressure results in a log or diary ?keep a blood pressure log ?take blood pressure log to all doctor appointments ?take medications for blood pressure exactly as prescribed ?report new symptoms to your doctor ?Continue to follow low sodium diet- avoid fast food and salty snacks ?Follow carbohydrate modified diet - avoid concentrated sweets ?Continue doing daily exercises prescribed by PT and OT, continue walking  ?Practice good hygiene to prevent UTI, drink adequate fluids, preferably water ?Follow up with Dr. Dennard Schaumann on 12/25/21 ? ?Telephone follow up appointment with care management team member scheduled for:  01/26/22 ? ?Our next appointment is by telephone on 01/26/22 at 9 am ? ?Please call the care guide team at [phone] if you need to cancel or reschedule your appointment.  ? ?If you are experiencing a Mental Health or [place] or need someone to talk to, please call the Suicide and Crisis Lifeline: 988 ?call the [place] National Suicide Prevention Lifeline: (720) 022-6030 or TTY: 585-550-8025 TTY (309)363-9158) to talk to a trained counselor ?call 1-800-273-TALK (toll free, 24 hour hotline) ?go to [facility] [address], [place] [phone]) ?call 911  ? ?Patient verbalizes understanding of instructions and care plan provided  today and agrees to view in [place]. Active MyChart status confirmed with patient.   ? ?Jacqlyn Larsen RNC, BSN ?RN Case Manager ?[place] ?[phone] ?  ?

## 2021-11-20 NOTE — Chronic Care Management (AMB) (Signed)
?Chronic Care Management  ? ?CCM RN Visit Note ? ?11/20/2021 ?Name: Renee [name] MRN: [id number] DOB: 02/01/1929 ? ?Subjective: ?Renee [name] is a 88 y.o. year old female who is a primary care patient of Pickard, Cammie Mcgee, MD. The care management team was consulted for assistance with disease management and care coordination needs.   ? ?Engaged with patient by telephone for follow up visit in response to provider referral for case management and/or care coordination services.  ? ?Consent to Services:  ?The patient was given information about Chronic Care Management services, agreed to services, and gave verbal consent prior to initiation of services.  Please see initial visit note for detailed documentation.  ? ?Patient agreed to services and verbal consent obtained.  ? ?Assessment: Review of patient past medical history, allergies, medications, health status, including review of consultants reports, laboratory and other test data, was performed as part of comprehensive evaluation and provision of chronic care management services.  ? ?SDOH (Social Determinants of Health) assessments and interventions performed:   ? ?CCM Care Plan ? ?Allergies  ?Allergen Reactions  ? Tape Other (See Comments)  ?  The patient's skin is VERY THIN- will TEAR EASILY!!  ? Diclofenac Sodium Swelling and Other (See Comments)  ?  Angioedema  ? Pioglitazone Swelling and Other (See Comments)  ?  Re-started in 2022  ? ? ?Outpatient Encounter Medications as of 11/20/2021  ?Medication Sig  ? acetaminophen (TYLENOL) 500 MG tablet Take 1,000 mg by mouth at bedtime.  ? albuterol (VENTOLIN HFA) 108 (90 Base) MCG/ACT inhaler INHALE 2 PUFFS INTO THE LUNGS EVERY 4 HOURS AS NEEDED FOR WHEEZING/SHORTNESS OF BREATH (Patient taking differently: Inhale 2 puffs into the lungs every 4 (four) hours as needed for shortness of breath or wheezing.)  ? ALPRAZolam (XANAX) 0.5 MG tablet Take 1 tablet (0.5 mg total) by mouth 2 (two) times daily. TAKE 1/2 TO 1 TABLET BY  MOUTH TWICE A DAY AS NEEDED FOR ANXIETY Strength: 0.5 mg  ? amLODipine (NORVASC) 10 MG tablet TAKE 1 TABLET BY MOUTH EVERY DAY  ? aspirin EC 325 MG tablet Take 325 mg by mouth in the morning.  ? Blood Glucose Monitoring Suppl (ONE TOUCH ULTRA 2) w/Device KIT Check blood sugar once daily and as directed. Dx E11.9  ? calcium-vitamin D (OSCAL WITH D) 500-200 MG-UNIT per tablet Take 1 tablet by mouth daily.  ? Cholecalciferol (VITAMIN D-3) 25 MCG (1000 UT) CAPS Take 1,000 Units by mouth in the morning.  ? Continuous Blood Gluc Receiver (FREESTYLE LIBRE 2 READER) DEVI 1 each by Does not apply route as directed.  ? Continuous Blood Gluc Sensor (FREESTYLE LIBRE 2 SENSOR) MISC 1 each by Does not apply route as directed.  ? Cranberry 500 MG TABS Take 500 mg by mouth in the morning and at bedtime.  ? docusate sodium (COLACE) 100 MG capsule Take 200 mg by mouth at bedtime.  ? estradiol (ESTRACE) 0.1 MG/GM vaginal cream PLEASE SEE ATTACHED FOR DETAILED DIRECTIONS  ? FLONASE 50 MCG/ACT nasal spray Place 2 sprays into both nostrils See admin instructions. Instill 2 sprays into each nostril at bedtime when not using Nasacort  ? furosemide (LASIX) 20 MG tablet Take by mouth.  ? glipiZIDE (GLUCOTROL XL) 5 MG 24 hr tablet Take 5 mg by mouth daily.  ? glucose blood (ONETOUCH ULTRA) test strip CHECK BLOOD SUGAR ONCE DAILY AND AS DIRECTED.  ? hydrALAZINE (APRESOLINE) 100 MG tablet Take 1 tablet (100 mg total) by mouth 3 (three)  times daily.  ? hydrochlorothiazide (MICROZIDE) 12.5 MG capsule Take 1 capsule (12.5 mg total) by mouth daily.  ? Lancets (ONETOUCH DELICA PLUS [id number]) MISC CHECK BLOOD SUGAR ONCE DAILY AND AS DIRECTED  ? metoprolol succinate (TOPROL-XL) 25 MG 24 hr tablet Take 12.5 mg by mouth 2 (two) times daily.  ? mirtazapine (REMERON) 15 MG tablet Take 1 tablet (15 mg total) by mouth at bedtime.  ? Multiple Vitamin (MULTIVITAMIN) capsule Take 1 capsule by mouth daily.  ? Multiple Vitamins-Minerals (PRESERVISION AREDS  2+MULTI VIT) CAPS Take 1 capsule by mouth in the morning and at bedtime.  ? Olopatadine HCl (PATADAY OP) Place 1 drop into both eyes 2 (two) times daily as needed (for itching or redness).  ? pioglitazone (ACTOS) 30 MG tablet Take 1 tablet (30 mg total) by mouth daily.  ? sertraline (ZOLOFT) 50 MG tablet TAKE 1 TABLET BY MOUTH EVERY DAY  ? sitaGLIPtin (JANUVIA) 100 MG tablet Take 1 tablet (100 mg total) by mouth daily.  ? trandolapril (MAVIK) 4 MG tablet TAKE 1 TABLET DAILY  ? triamcinolone (NASACORT) 55 MCG/ACT AERO nasal inhaler Place 2 sprays into the nose daily. (Patient taking differently: Place 2 sprays into the nose See admin instructions. Instill 2 sprays into each nostril at bedtime when not using Flonase)  ? vitamin B-12 (CYANOCOBALAMIN) 1000 MCG tablet Take 1,000 mcg by mouth daily.   ? ?No facility-administered encounter medications on file as of 11/20/2021.  ? ? ?Patient Active Problem List  ? Diagnosis Date Noted  ? Bradycardia 09/01/2021  ? UTI (urinary tract infection) 08/29/2021  ? Wheezing on expiration 08/29/2021  ? Pleural effusion 05/13/2020  ? Frequent UTI 05/13/2020  ? Dizziness 02/12/2020  ? Normocytic anemia 02/12/2020  ? Fall involving sidewalk curb 11/29/2019  ? Contusion of back 11/27/2019  ? Aortic atherosclerosis ([place]) 07/13/2019  ? CKD (chronic kidney disease) stage 4, GFR 15-29 ml/min (HCC) 07/01/2019  ? External hemorrhoid 01/17/2018  ? History of colitis 01/17/2018  ? Generalized weakness 12/24/2016  ? Diabetic retinopathy ([place]) 12/11/2016  ? History of CVA (cerebrovascular accident) 12/11/2016  ? Weakness secondary to UTI 10/21/2015  ? Estrogen deficiency 08/30/2015  ? Mobility impaired 06/26/2011  ? History of retinal detachment 01/10/2011  ? Sleep apnea 11/28/2010  ? Anxiety and depression 08/25/2010  ? Hemiplegia, late effect of cerebrovascular disease ([place]) 07/05/2010  ? POSTHERPETIC NEURALGIA 11/09/2009  ? Renal insufficiency 06/29/2008  ? Chronic back pain 01/26/2008  ? EDEMA  01/26/2008  ? B12 deficiency 01/10/2007  ? Type 2 diabetes, controlled, with retinopathy ([place]) 11/27/2006  ? Essential hypertension 11/27/2006  ? FIBROCYSTIC BREAST DISEASE 11/27/2006  ? ROSACEA 11/27/2006  ? OSTEOARTHRITIS 11/27/2006  ? URINARY INCONTINENCE, MIXED 11/27/2006  ? ? ?Conditions to be addressed/monitored:HTN and DMII ? ?Care Plan : RN Care Manager plan of care  ?Updates made by Kassie Mends, RN since 11/20/2021 12:00 AM  ?  ? ?Problem: No plan of care established for management of chronic disease states (DM2, HTN, CKD stage 4)   ?Priority: High  ?  ? ?Long-Range Goal: Development of plan of care for chronic disease management (DM2, HTN, CKD stage 4)   ?Start Date: 07/10/2021  ?Expected End Date: 05/19/2022  ?Priority: High  ?Note:   ?Current Barriers:  ?Knowledge Deficits related to plan of care for management of HTN and DMII - patient reports she lives alone, has hired help daily from 9 am- 5 pm, has sister in law she can call for assistance if needed, pt  does not drive and needs assistance with ADL's such as bathing, cooking, medication oversight etc and her aide assists.  ?Patient reports she checks CBG once daily with readings 111-181 range with the occasional >200 if "I eat something I shouldn't", checks blood pressure daily and reports readings " are usually always good". Patient reports she has advanced directives with her sister Frazier Butt as HCPOA.  Patient reports home health discharged, hired help assists with walking patient and helping her to do exercises.  Patient needs assistance with ongoing teaching/ reinforcement of diabetes/ HTN management and diet.  ?Permission given to speak with aide [name] who reports she is trying to cook healthy foods for patient, assists her to do daily exercises and gets her outside weather permitting, reports pt is moving around more.  Pt hospitalized 08/29/21 for acute cystitis, and trying to be careful with drinking adequate fluids. ? ?RNCM Clinical  Goal(s):  ?Patient will verbalize understanding of plan for management of HTN and DMII as evidenced by patient report, review of EHR  through collaboration with RN Care manager, provider, and care team.  ?

## 2021-11-23 DIAGNOSIS — L820 Inflamed seborrheic keratosis: Secondary | ICD-10-CM | POA: Diagnosis not present

## 2021-11-23 DIAGNOSIS — L821 Other seborrheic keratosis: Secondary | ICD-10-CM | POA: Diagnosis not present

## 2021-11-23 DIAGNOSIS — D225 Melanocytic nevi of trunk: Secondary | ICD-10-CM | POA: Diagnosis not present

## 2021-11-25 DIAGNOSIS — R062 Wheezing: Secondary | ICD-10-CM | POA: Diagnosis not present

## 2021-12-11 ENCOUNTER — Other Ambulatory Visit: Payer: Self-pay | Admitting: Pulmonary Disease

## 2021-12-11 ENCOUNTER — Other Ambulatory Visit: Payer: Self-pay | Admitting: Family Medicine

## 2021-12-11 MED ORDER — SERTRALINE HCL 50 MG PO TABS: 50.0000 mg | ORAL_TABLET | Freq: Every day | ORAL | 1 refills | Status: DC

## 2021-12-11 NOTE — Telephone Encounter (Signed)
Pharmacy faxed a refill request for  ? ?sertraline (ZOLOFT) 50 MG tablet [[id number]]  ?  Order Details ?Dose, Route, Frequency: As Directed  ?Dispense Quantity: 90 tablet Refills: 1   ?     ?Sig: TAKE 1 TABLET BY MOUTH EVERY DAY  ?     ?Start Date: 07/05/21 End Date: --  ?Written Date: 07/05/21 Expiration Date: 07/05/22  ? ?

## 2021-12-17 DIAGNOSIS — I10 Essential (primary) hypertension: Secondary | ICD-10-CM

## 2021-12-17 DIAGNOSIS — E1165 Type 2 diabetes mellitus with hyperglycemia: Secondary | ICD-10-CM

## 2021-12-25 ENCOUNTER — Ambulatory Visit (INDEPENDENT_AMBULATORY_CARE_PROVIDER_SITE_OTHER): Payer: Medicare HMO | Admitting: Family Medicine

## 2021-12-25 VITALS — BP 140/48 | HR 62 | Temp 97.9°F | Resp 16 | Ht 63.0 in | Wt 174.0 lb

## 2021-12-25 DIAGNOSIS — E1165 Type 2 diabetes mellitus with hyperglycemia: Secondary | ICD-10-CM

## 2021-12-25 MED ORDER — SOLIFENACIN SUCCINATE 10 MG PO TABS: 10.0000 mg | ORAL_TABLET | Freq: Every day | ORAL | 1 refills | Status: DC

## 2021-12-25 NOTE — Progress Notes (Signed)
? ?Subjective:  ? ? Patient ID: Renee [name], female    DOB: October 12, 1928, 88 y.o.   MRN: [id number] ? ?10/27/21 ?At last ov, A1c was 8 and I recommended adding actos 30 mg poqday and recheck in 3 months.  Patient is here today with her caregiver.  She denies any dysuria.  She denies any frequency or urgency.  She has been using the vaginal estrogen cream without any difficulty.  She denies any cough.  She denies any chest pain or shortness of breath.  She has very little swelling in her legs.  She has been consistently taking pioglitazone.  However her blood sugars are typically around 180 in the morning fasting.  She denies any hypoglycemic episodes.  She is not checking her sugar later in the day.  At that time my plan was: ?Blood sugars sound like they are poorly controlled.  I will repeat an A1c today.  If still 8 or higher, I plan to discontinue Januvia and start the patient on Trulicity as I feel that this will be more effective for her.  We discussed possibly starting insulin however I am concerned about hypoglycemia in this patient.  Blood pressure today is acceptable.  Check CBC and CMP. ? ?12/25/21 ?HgA1c was 7.1.  Therefore, I did not make the switch from [place] to trulicity.  Patient's caregiver ask if they can use oxybutynin at night.  After she goes to bed, by the next morning she has soaked her gown and soaked through numerous pads at night while sleeping.  During the day she does quite well.  We discussed the risk of oxybutynin including confusion, memory loss, and sedation.  I would rather try something that is less prone to cross the blood-brain barrier such as Vesicare.  Other than that she is doing quite well.  She reports that her blood sugars are between 140 and 150 and usually.  They have not seen a blood sugar over 170.  She has not recently had a urinary tract infection.  There is no pitting edema in her extremities.  Her lungs are clear.  She denies any shortness of breath or chest  pain. ?Past Medical History:  ?Diagnosis Date  ? Angioedema   ? possibly from voltaren  ? Bronchopneumonia 12/11/2016  ? Degenerative disc disease   ? Diabetes mellitus   ? type II  ? Hyperlipidemia   ? Hypertension   ? LVH (left ventricular hypertrophy)   ? and atrial enlargement by echo in past with nl EF  ? Nasal pruritis   ? Osteoarthritis   ? Osteopenia   ? Renal insufficiency   ? Sleep apnea   ? Stroke [facility]) 05/2010  ? Small vessel sobcortical (in [place]) with Dr Leonie Man, residual L hemiparesis  ? Vitamin B 12 deficiency 04/08  ? ?Past Surgical History:  ?Procedure Laterality Date  ? ABDOMINAL HYSTERECTOMY    ? BSO-fibroids  ? APPENDECTOMY    ? BACK SURGERY    ? COLON SURGERY    ? due to punctured intestines  ? EYE SURGERY    ? cataract extraction  ? IR THORACENTESIS ASP PLEURAL SPACE W/IMG GUIDE  06/07/2020  ? KNEE SURGERY    ? arthroscope  ? PARS PLANA VITRECTOMY  07/31/2011  ? Procedure: PARS PLANA VITRECTOMY WITH 25 GAUGE;  Surgeon: [name], MD;  Location: [facility];  Service: Ophthalmology;  Laterality: Right;  REMOVAL OF SILICONE OIL AND LASER RIGHT EYE  ? RETINAL DETACHMENT SURGERY  02/18/11  ?  times 2  ? SPINE SURGERY  08/09  ? spinal decompression surgery  ? ?Current Outpatient Medications on File Prior to Visit  ?Medication Sig Dispense Refill  ? acetaminophen (TYLENOL) 500 MG tablet Take 1,000 mg by mouth at bedtime.    ? albuterol (VENTOLIN HFA) 108 (90 Base) MCG/ACT inhaler INHALE 2 PUFFS INTO THE LUNGS EVERY 4 HOURS AS NEEDED FOR WHEEZING/SHORTNESS OF BREATH (Patient taking differently: Inhale 2 puffs into the lungs every 4 (four) hours as needed for shortness of breath or wheezing.) 18 each 3  ? ALPRAZolam (XANAX) 0.5 MG tablet Take 1 tablet (0.5 mg total) by mouth 2 (two) times daily. TAKE 1/2 TO 1 TABLET BY MOUTH TWICE A DAY AS NEEDED FOR ANXIETY Strength: 0.5 mg 20 tablet 0  ? amLODipine (NORVASC) 10 MG tablet TAKE 1 TABLET BY MOUTH EVERY DAY 90 tablet 3  ? aspirin EC 325 MG tablet Take  325 mg by mouth in the morning.    ? Blood Glucose Monitoring Suppl (ONE TOUCH ULTRA 2) w/Device KIT Check blood sugar once daily and as directed. Dx E11.9 1 each 0  ? calcium-vitamin D (OSCAL WITH D) 500-200 MG-UNIT per tablet Take 1 tablet by mouth daily.    ? cephALEXin (KEFLEX) 500 MG capsule Take 500 mg by mouth daily.    ? Cholecalciferol (VITAMIN D-3) 25 MCG (1000 UT) CAPS Take 1,000 Units by mouth in the morning.    ? Continuous Blood Gluc Receiver (FREESTYLE LIBRE 2 READER) DEVI 1 each by Does not apply route as directed. 1 each 1  ? Continuous Blood Gluc Sensor (FREESTYLE LIBRE 2 SENSOR) MISC 1 each by Does not apply route as directed. 1 each 5  ? Cranberry 500 MG TABS Take 500 mg by mouth in the morning and at bedtime.    ? docusate sodium (COLACE) 100 MG capsule Take 200 mg by mouth at bedtime.    ? estradiol (ESTRACE) 0.1 MG/GM vaginal cream PLEASE SEE ATTACHED FOR DETAILED DIRECTIONS    ? FLONASE 50 MCG/ACT nasal spray Place 2 sprays into both nostrils See admin instructions. Instill 2 sprays into each nostril at bedtime when not using Nasacort    ? furosemide (LASIX) 20 MG tablet TAKE 1/2 TABLET BY MOUTH EVERY DAY 45 tablet 1  ? glipiZIDE (GLUCOTROL XL) 5 MG 24 hr tablet TAKE 1 TABLET BY MOUTH EVERY DAY WITH BREAKFAST 90 tablet 3  ? glucose blood (ONETOUCH ULTRA) test strip CHECK BLOOD SUGAR ONCE DAILY AND AS DIRECTED. 100 strip 11  ? hydrALAZINE (APRESOLINE) 100 MG tablet Take 1 tablet (100 mg total) by mouth 3 (three) times daily. 270 tablet 3  ? hydrochlorothiazide (MICROZIDE) 12.5 MG capsule TAKE 1 CAPSULE BY MOUTH EVERY DAY 90 capsule 1  ? JANUVIA 100 MG tablet TAKE 1 TABLET BY MOUTH EVERY DAY 30 tablet 2  ? Lancets (ONETOUCH DELICA PLUS [id number]) MISC CHECK BLOOD SUGAR ONCE DAILY AND AS DIRECTED 100 each 2  ? metoprolol succinate (TOPROL-XL) 25 MG 24 hr tablet Take 12.5 mg by mouth 2 (two) times daily.    ? mirtazapine (REMERON) 15 MG tablet Take 1 tablet (15 mg total) by mouth at bedtime. 90  tablet 3  ? Multiple Vitamin (MULTIVITAMIN) capsule Take 1 capsule by mouth daily.    ? Multiple Vitamins-Minerals (PRESERVISION AREDS 2+MULTI VIT) CAPS Take 1 capsule by mouth in the morning and at bedtime.    ? Olopatadine HCl (PATADAY OP) Place 1 drop into both eyes 2 (two) times daily  as needed (for itching or redness).    ? pioglitazone (ACTOS) 30 MG tablet Take 1 tablet (30 mg total) by mouth daily. 90 tablet 3  ? sertraline (ZOLOFT) 50 MG tablet Take 1 tablet (50 mg total) by mouth daily. 90 tablet 1  ? trandolapril (MAVIK) 4 MG tablet TAKE 1 TABLET DAILY 90 tablet 3  ? triamcinolone (NASACORT) 55 MCG/ACT AERO nasal inhaler Place 2 sprays into the nose daily. (Patient taking differently: Place 2 sprays into the nose See admin instructions. Instill 2 sprays into each nostril at bedtime when not using Flonase) 1 each 5  ? vitamin B-12 (CYANOCOBALAMIN) 1000 MCG tablet Take 1,000 mcg by mouth daily.     ? ?No current facility-administered medications on file prior to visit.  ? ?Allergies  ?Allergen Reactions  ? Tape Other (See Comments)  ?  The patient's skin is VERY THIN- will TEAR EASILY!!  ? Diclofenac Sodium Swelling and Other (See Comments)  ?  Angioedema  ? Pioglitazone Swelling and Other (See Comments)  ?  Re-started in 2022  ? ?Social History  ? ?Socioeconomic History  ? Marital status: Widowed  ?  Spouse name: Not on file  ? Number of children: Not on file  ? Years of education: Not on file  ? Highest education level: Not on file  ?Occupational History  ? Not on file  ?Tobacco Use  ? Smoking status: Never  ? Smokeless tobacco: Never  ?Vaping Use  ? Vaping Use: Not on file  ?Substance and Sexual Activity  ? Alcohol use: No  ?  Alcohol/week: 0.0 standard drinks  ? Drug use: No  ? Sexual activity: Never  ?Other Topics Concern  ? Not on file  ?Social History Narrative  ? Not on file  ? ?Social Determinants of Health  ? ?Financial Resource Strain: Low Risk   ? Difficulty of Paying Living Expenses: Not hard at  all  ?Food Insecurity: No Food Insecurity  ? Worried About Charity fundraiser in the Last Year: Never true  ? Ran Out of Food in the Last Year: Never true  ?Transportation Needs: No Transportation Needs  ? [place]

## 2021-12-26 LAB — LIPID PANEL
Cholesterol: 230 mg/dL — ABNORMAL HIGH (ref <200)
HDL: 30 mg/dL — ABNORMAL LOW (ref >=50)
LDL Cholesterol (Calc): 146 mg/dL (calc) — ABNORMAL HIGH
Non-HDL Cholesterol (Calc): 200 mg/dL (calc) — ABNORMAL HIGH (ref <130)
Total CHOL/HDL Ratio: 7.7 (calc) — ABNORMAL HIGH (ref <5.0)
Triglycerides: 348 mg/dL — ABNORMAL HIGH (ref <150)

## 2021-12-26 LAB — COMPLETE METABOLIC PANEL WITH GFR
AG Ratio: 1.8 (calc) (ref 1.0–2.5)
ALT: 13 U/L (ref 6–29)
AST: 14 U/L (ref 10–35)
Albumin: 3.5 g/dL — ABNORMAL LOW (ref 3.6–5.1)
Alkaline phosphatase (APISO): 63 U/L (ref 37–153)
BUN/Creatinine Ratio: 20 (calc) (ref 6–22)
BUN: 32 mg/dL — ABNORMAL HIGH (ref 7–25)
CO2: 27 mmol/L (ref 20–32)
Calcium: 8.5 mg/dL — ABNORMAL LOW (ref 8.6–10.4)
Chloride: 104 mmol/L (ref 98–110)
Creat: 1.59 mg/dL — ABNORMAL HIGH (ref 0.60–0.95)
Globulin: 2 g/dL (calc) (ref 1.9–3.7)
Glucose, Bld: 278 mg/dL — ABNORMAL HIGH (ref 65–99)
Potassium: 4.6 mmol/L (ref 3.5–5.3)
Sodium: 141 mmol/L (ref 135–146)
Total Bilirubin: 0.4 mg/dL (ref 0.2–1.2)
Total Protein: 5.5 g/dL — ABNORMAL LOW (ref 6.1–8.1)
eGFR: 31 mL/min/{1.73_m2} — ABNORMAL LOW (ref >=60)

## 2021-12-26 LAB — CBC WITH DIFFERENTIAL/PLATELET
Absolute Monocytes: 307 cells/uL (ref 200–950)
Basophils Absolute: 57 cells/uL (ref 0–200)
Basophils Relative: 1.1 %
Eosinophils Absolute: 198 cells/uL (ref 15–500)
Eosinophils Relative: 3.8 %
HCT: 37.1 % (ref 35.0–45.0)
Hemoglobin: 11.5 g/dL — ABNORMAL LOW (ref 11.7–15.5)
Lymphs Abs: 697 cells/uL — ABNORMAL LOW (ref 850–3900)
MCH: 27.5 pg (ref 27.0–33.0)
MCHC: 31 g/dL — ABNORMAL LOW (ref 32.0–36.0)
MCV: 88.8 fL (ref 80.0–100.0)
MPV: 11.6 fL (ref 7.5–12.5)
Monocytes Relative: 5.9 %
Neutro Abs: 3942 cells/uL (ref 1500–7800)
Neutrophils Relative %: 75.8 %
Platelets: 172 10*3/uL (ref 140–400)
RBC: 4.18 10*6/uL (ref 3.80–5.10)
RDW: 14.9 % (ref 11.0–15.0)
Total Lymphocyte: 13.4 %
WBC: 5.2 10*3/uL (ref 3.8–10.8)

## 2021-12-26 LAB — HEMOGLOBIN A1C
Hgb A1c MFr Bld: 6.8 % of total Hgb — ABNORMAL HIGH (ref <5.7)
Mean Plasma Glucose: 148 mg/dL
eAG (mmol/L): 8.2 mmol/L

## 2022-01-04 ENCOUNTER — Telehealth: Payer: Self-pay

## 2022-01-04 NOTE — Telephone Encounter (Signed)
Per Renee [name]: She is developing a cold and sore throat and is beginning to cough up matter. We don't want her to go into pneumonia. Please let us know what we need to do.  Please advise, thanks!

## 2022-01-04 NOTE — Telephone Encounter (Signed)
LMTRC

## 2022-01-04 NOTE — Telephone Encounter (Signed)
Spoke with patient's daughter, Rollene Fare ([place]), and advised. She will let us know if patient develops any further symptoms. Nothing further needed at this time.

## 2022-01-05 ENCOUNTER — Inpatient Hospital Stay (HOSPITAL_COMMUNITY)
Admission: EM | Admit: 2022-01-05 | Discharge: 2022-01-16 | DRG: 871 | Disposition: A | Discharge status: Home Health | Payer: Medicare HMO | Attending: Internal Medicine | Admitting: Internal Medicine

## 2022-01-05 ENCOUNTER — Emergency Department (HOSPITAL_COMMUNITY): Payer: Medicare HMO

## 2022-01-05 ENCOUNTER — Encounter (HOSPITAL_COMMUNITY): Payer: Self-pay

## 2022-01-05 ENCOUNTER — Other Ambulatory Visit: Payer: Self-pay

## 2022-01-05 DIAGNOSIS — Z6831 Body mass index (BMI) 31.0-31.9, adult: Secondary | ICD-10-CM

## 2022-01-05 DIAGNOSIS — J45901 Unspecified asthma with (acute) exacerbation: Secondary | ICD-10-CM | POA: Diagnosis present

## 2022-01-05 DIAGNOSIS — I444 Left anterior fascicular block: Secondary | ICD-10-CM | POA: Diagnosis not present

## 2022-01-05 DIAGNOSIS — Z20822 Contact with and (suspected) exposure to covid-19: Secondary | ICD-10-CM | POA: Diagnosis not present

## 2022-01-05 DIAGNOSIS — T380X5A Adverse effect of glucocorticoids and synthetic analogues, initial encounter: Secondary | ICD-10-CM | POA: Diagnosis not present

## 2022-01-05 DIAGNOSIS — R6520 Severe sepsis without septic shock: Secondary | ICD-10-CM | POA: Diagnosis not present

## 2022-01-05 DIAGNOSIS — Z90710 Acquired absence of both cervix and uterus: Secondary | ICD-10-CM | POA: Diagnosis not present

## 2022-01-05 DIAGNOSIS — Z8673 Personal history of transient ischemic attack (TIA), and cerebral infarction without residual deficits: Secondary | ICD-10-CM

## 2022-01-05 DIAGNOSIS — J4521 Mild intermittent asthma with (acute) exacerbation: Secondary | ICD-10-CM | POA: Diagnosis present

## 2022-01-05 DIAGNOSIS — I10 Essential (primary) hypertension: Secondary | ICD-10-CM | POA: Diagnosis not present

## 2022-01-05 DIAGNOSIS — I3139 Other pericardial effusion (noninflammatory): Secondary | ICD-10-CM | POA: Diagnosis present

## 2022-01-05 DIAGNOSIS — R531 Weakness: Secondary | ICD-10-CM | POA: Diagnosis not present

## 2022-01-05 DIAGNOSIS — Z888 Allergy status to other drugs, medicaments and biological substances status: Secondary | ICD-10-CM

## 2022-01-05 DIAGNOSIS — Z79899 Other long term (current) drug therapy: Secondary | ICD-10-CM

## 2022-01-05 DIAGNOSIS — E1122 Type 2 diabetes mellitus with diabetic chronic kidney disease: Secondary | ICD-10-CM | POA: Diagnosis not present

## 2022-01-05 DIAGNOSIS — Z90722 Acquired absence of ovaries, bilateral: Secondary | ICD-10-CM

## 2022-01-05 DIAGNOSIS — Y92239 Unspecified place in hospital as the place of occurrence of the external cause: Secondary | ICD-10-CM | POA: Diagnosis not present

## 2022-01-05 DIAGNOSIS — F32A Depression, unspecified: Secondary | ICD-10-CM | POA: Diagnosis not present

## 2022-01-05 DIAGNOSIS — E669 Obesity, unspecified: Secondary | ICD-10-CM | POA: Diagnosis present

## 2022-01-05 DIAGNOSIS — N179 Acute kidney failure, unspecified: Secondary | ICD-10-CM | POA: Diagnosis not present

## 2022-01-05 DIAGNOSIS — I130 Hypertensive heart and chronic kidney disease with heart failure and stage 1 through stage 4 chronic kidney disease, or unspecified chronic kidney disease: Secondary | ICD-10-CM | POA: Diagnosis not present

## 2022-01-05 DIAGNOSIS — Z8249 Family history of ischemic heart disease and other diseases of the circulatory system: Secondary | ICD-10-CM

## 2022-01-05 DIAGNOSIS — G4733 Obstructive sleep apnea (adult) (pediatric): Secondary | ICD-10-CM | POA: Diagnosis not present

## 2022-01-05 DIAGNOSIS — J189 Pneumonia, unspecified organism: Secondary | ICD-10-CM | POA: Diagnosis present

## 2022-01-05 DIAGNOSIS — F419 Anxiety disorder, unspecified: Secondary | ICD-10-CM | POA: Diagnosis present

## 2022-01-05 DIAGNOSIS — E11319 Type 2 diabetes mellitus with unspecified diabetic retinopathy without macular edema: Secondary | ICD-10-CM | POA: Diagnosis present

## 2022-01-05 DIAGNOSIS — I69354 Hemiplegia and hemiparesis following cerebral infarction affecting left non-dominant side: Secondary | ICD-10-CM

## 2022-01-05 DIAGNOSIS — I5032 Chronic diastolic (congestive) heart failure: Secondary | ICD-10-CM | POA: Diagnosis not present

## 2022-01-05 DIAGNOSIS — E1165 Type 2 diabetes mellitus with hyperglycemia: Secondary | ICD-10-CM | POA: Diagnosis not present

## 2022-01-05 DIAGNOSIS — R059 Cough, unspecified: Secondary | ICD-10-CM | POA: Diagnosis not present

## 2022-01-05 DIAGNOSIS — R0689 Other abnormalities of breathing: Secondary | ICD-10-CM | POA: Diagnosis not present

## 2022-01-05 DIAGNOSIS — R739 Hyperglycemia, unspecified: Secondary | ICD-10-CM | POA: Diagnosis not present

## 2022-01-05 DIAGNOSIS — R0602 Shortness of breath: Secondary | ICD-10-CM | POA: Diagnosis not present

## 2022-01-05 DIAGNOSIS — E785 Hyperlipidemia, unspecified: Secondary | ICD-10-CM | POA: Diagnosis present

## 2022-01-05 DIAGNOSIS — N1832 Chronic kidney disease, stage 3b: Secondary | ICD-10-CM | POA: Diagnosis present

## 2022-01-05 DIAGNOSIS — J9601 Acute respiratory failure with hypoxia: Secondary | ICD-10-CM | POA: Diagnosis not present

## 2022-01-05 DIAGNOSIS — A419 Sepsis, unspecified organism: Secondary | ICD-10-CM | POA: Diagnosis not present

## 2022-01-05 DIAGNOSIS — J301 Allergic rhinitis due to pollen: Secondary | ICD-10-CM | POA: Diagnosis not present

## 2022-01-05 DIAGNOSIS — Z7984 Long term (current) use of oral hypoglycemic drugs: Secondary | ICD-10-CM

## 2022-01-05 DIAGNOSIS — Z66 Do not resuscitate: Secondary | ICD-10-CM | POA: Diagnosis not present

## 2022-01-05 DIAGNOSIS — Z743 Need for continuous supervision: Secondary | ICD-10-CM | POA: Diagnosis not present

## 2022-01-05 DIAGNOSIS — R69 Illness, unspecified: Secondary | ICD-10-CM | POA: Diagnosis not present

## 2022-01-05 DIAGNOSIS — Z825 Family history of asthma and other chronic lower respiratory diseases: Secondary | ICD-10-CM

## 2022-01-05 DIAGNOSIS — Z7951 Long term (current) use of inhaled steroids: Secondary | ICD-10-CM

## 2022-01-05 DIAGNOSIS — I5033 Acute on chronic diastolic (congestive) heart failure: Secondary | ICD-10-CM | POA: Diagnosis present

## 2022-01-05 DIAGNOSIS — F411 Generalized anxiety disorder: Secondary | ICD-10-CM | POA: Diagnosis present

## 2022-01-05 DIAGNOSIS — R062 Wheezing: Secondary | ICD-10-CM | POA: Diagnosis not present

## 2022-01-05 DIAGNOSIS — J309 Allergic rhinitis, unspecified: Secondary | ICD-10-CM | POA: Diagnosis present

## 2022-01-05 LAB — COMPREHENSIVE METABOLIC PANEL
ALT: 17 U/L (ref 0–44)
AST: 23 U/L (ref 15–41)
Albumin: 3.3 g/dL — ABNORMAL LOW (ref 3.5–5.0)
Alkaline Phosphatase: 72 U/L (ref 38–126)
Anion gap: 11 (ref 5–15)
BUN: 32 mg/dL — ABNORMAL HIGH (ref 8–23)
CO2: 26 mmol/L (ref 22–32)
Calcium: 8.8 mg/dL — ABNORMAL LOW (ref 8.9–10.3)
Chloride: 102 mmol/L (ref 98–111)
Creatinine, Ser: 1.82 mg/dL — ABNORMAL HIGH (ref 0.44–1.00)
GFR, Estimated: 26 mL/min — ABNORMAL LOW (ref >60)
Glucose, Bld: 351 mg/dL — ABNORMAL HIGH (ref 70–99)
Potassium: 4.3 mmol/L (ref 3.5–5.1)
Sodium: 139 mmol/L (ref 135–145)
Total Bilirubin: 0.6 mg/dL (ref 0.3–1.2)
Total Protein: 5.8 g/dL — ABNORMAL LOW (ref 6.5–8.1)

## 2022-01-05 LAB — CBC WITH DIFFERENTIAL/PLATELET
Abs Immature Granulocytes: 0.06 10*3/uL (ref 0.00–0.07)
Basophils Absolute: 0.1 10*3/uL (ref 0.0–0.1)
Basophils Relative: 1 %
Eosinophils Absolute: 0 10*3/uL (ref 0.0–0.5)
Eosinophils Relative: 0 %
HCT: 34.1 % — ABNORMAL LOW (ref 36.0–46.0)
Hemoglobin: 10.6 g/dL — ABNORMAL LOW (ref 12.0–15.0)
Immature Granulocytes: 1 %
Lymphocytes Relative: 4 %
Lymphs Abs: 0.4 10*3/uL — ABNORMAL LOW (ref 0.7–4.0)
MCH: 28 pg (ref 26.0–34.0)
MCHC: 31.1 g/dL (ref 30.0–36.0)
MCV: 90.2 fL (ref 80.0–100.0)
Monocytes Absolute: 0.7 10*3/uL (ref 0.1–1.0)
Monocytes Relative: 7 %
Neutro Abs: 8.8 10*3/uL — ABNORMAL HIGH (ref 1.7–7.7)
Neutrophils Relative %: 87 %
Platelets: 135 10*3/uL — ABNORMAL LOW (ref 150–400)
RBC: 3.78 MIL/uL — ABNORMAL LOW (ref 3.87–5.11)
RDW: 15.7 % — ABNORMAL HIGH (ref 11.5–15.5)
WBC: 9.9 10*3/uL (ref 4.0–10.5)
nRBC: 0 % (ref 0.0–0.2)

## 2022-01-05 LAB — LACTIC ACID, PLASMA
Lactic Acid, Venous: 2.8 mmol/L (ref 0.5–1.9)
Lactic Acid, Venous: 2.1 mmol/L (ref 0.5–1.9)

## 2022-01-05 LAB — TROPONIN I (HIGH SENSITIVITY)
Troponin I (High Sensitivity): 10 ng/L (ref <18)
Troponin I (High Sensitivity): 10 ng/L (ref <18)

## 2022-01-05 LAB — GLUCOSE, CAPILLARY: Glucose-Capillary: 411 mg/dL — ABNORMAL HIGH (ref 70–99)

## 2022-01-05 LAB — RESP PANEL BY RT-PCR (FLU A&B, COVID) ARPGX2
Influenza A by PCR: NEGATIVE
Influenza B by PCR: NEGATIVE
SARS Coronavirus 2 by RT PCR: NEGATIVE

## 2022-01-05 LAB — MAGNESIUM: Magnesium: 1.7 mg/dL (ref 1.7–2.4)

## 2022-01-05 LAB — BRAIN NATRIURETIC PEPTIDE: B Natriuretic Peptide: 243.4 pg/mL — ABNORMAL HIGH (ref 0.0–100.0)

## 2022-01-05 MED ORDER — ALPRAZOLAM 0.5 MG PO TABS: 0.5000 mg | ORAL_TABLET | Freq: Two times a day (BID) | ORAL | Status: DC | PRN

## 2022-01-05 MED ORDER — ASPIRIN 325 MG PO TBEC
325.0000 mg | DELAYED_RELEASE_TABLET | Freq: Every day | ORAL | Status: DC
  Administered 2022-01-06 – 2022-01-16 (×11): 325 mg via ORAL
  Filled 2022-01-05 (×11): qty 1

## 2022-01-05 MED ORDER — IPRATROPIUM-ALBUTEROL 0.5-2.5 (3) MG/3ML IN SOLN
3.0000 mL | RESPIRATORY_TRACT | Status: AC
  Administered 2022-01-05 (×3): 3 mL via RESPIRATORY_TRACT
  Filled 2022-01-05: qty 6
  Filled 2022-01-05: qty 3

## 2022-01-05 MED ORDER — CEFTRIAXONE SODIUM 2 G IJ SOLR
2.0000 g | INTRAMUSCULAR | Status: DC
Start: 2022-01-06 — End: 2022-01-06

## 2022-01-05 MED ORDER — INSULIN ASPART 100 UNIT/ML IJ SOLN
0.0000 [IU] | Freq: Three times a day (TID) | INTRAMUSCULAR | Status: DC
  Administered 2022-01-06: 3 [IU] via SUBCUTANEOUS
  Administered 2022-01-06: 5 [IU] via SUBCUTANEOUS
  Administered 2022-01-06: 3 [IU] via SUBCUTANEOUS
  Administered 2022-01-07: 2 [IU] via SUBCUTANEOUS
  Administered 2022-01-07: 7 [IU] via SUBCUTANEOUS
  Administered 2022-01-08: 1 [IU] via SUBCUTANEOUS

## 2022-01-05 MED ORDER — SODIUM CHLORIDE 0.9 % IV SOLN
500.0000 mg | INTRAVENOUS | Status: DC
  Administered 2022-01-06: 500 mg via INTRAVENOUS
  Filled 2022-01-05 (×2): qty 5

## 2022-01-05 MED ORDER — INSULIN ASPART 100 UNIT/ML IJ SOLN
7.0000 [IU] | Freq: Once | INTRAMUSCULAR | Status: AC
  Administered 2022-01-05: 7 [IU] via SUBCUTANEOUS

## 2022-01-05 MED ORDER — ACETAMINOPHEN 325 MG PO TABS
650.0000 mg | ORAL_TABLET | Freq: Four times a day (QID) | ORAL | Status: DC | PRN
Start: 2022-01-05 — End: 2022-01-16
  Administered 2022-01-05 – 2022-01-07 (×2): 650 mg via ORAL
  Filled 2022-01-05 (×2): qty 2

## 2022-01-05 MED ORDER — SODIUM CHLORIDE 0.9 % IV SOLN: 1.0000 g | Freq: Once | INTRAVENOUS | Status: DC

## 2022-01-05 MED ORDER — METHYLPREDNISOLONE SODIUM SUCC 125 MG IJ SOLR
80.0000 mg | Freq: Two times a day (BID) | INTRAMUSCULAR | Status: DC
  Administered 2022-01-06: 80 mg via INTRAVENOUS
  Filled 2022-01-05: qty 2

## 2022-01-05 MED ORDER — ACETAMINOPHEN 650 MG RE SUPP: 650.0000 mg | Freq: Four times a day (QID) | RECTAL | Status: DC | PRN

## 2022-01-05 MED ORDER — IPRATROPIUM-ALBUTEROL 0.5-2.5 (3) MG/3ML IN SOLN
3.0000 mL | Freq: Four times a day (QID) | RESPIRATORY_TRACT | Status: DC
  Administered 2022-01-06 (×2): 3 mL via RESPIRATORY_TRACT
  Filled 2022-01-05 (×2): qty 3

## 2022-01-05 MED ORDER — SODIUM CHLORIDE 0.9 % IV SOLN
2.0000 g | INTRAVENOUS | Status: AC
  Administered 2022-01-06 – 2022-01-11 (×7): 2 g via INTRAVENOUS
  Filled 2022-01-05 (×7): qty 20

## 2022-01-05 MED ORDER — FLUTICASONE PROPIONATE 50 MCG/ACT NA SUSP
2.0000 | Freq: Every day | NASAL | Status: DC
  Administered 2022-01-06 – 2022-01-15 (×10): 2 via NASAL
  Filled 2022-01-05: qty 16

## 2022-01-05 MED ORDER — ALBUTEROL SULFATE (2.5 MG/3ML) 0.083% IN NEBU
2.5000 mg | INHALATION_SOLUTION | RESPIRATORY_TRACT | Status: DC | PRN
  Administered 2022-01-07: 2.5 mg via RESPIRATORY_TRACT
  Filled 2022-01-05: qty 3

## 2022-01-05 MED ORDER — METHYLPREDNISOLONE SODIUM SUCC 125 MG IJ SOLR
125.0000 mg | Freq: Once | INTRAMUSCULAR | Status: AC
  Administered 2022-01-05: 125 mg via INTRAVENOUS
  Filled 2022-01-05: qty 2

## 2022-01-05 MED ORDER — INSULIN ASPART 100 UNIT/ML IJ SOLN
0.0000 [IU] | Freq: Every day | INTRAMUSCULAR | Status: DC
  Administered 2022-01-06: 3 [IU] via SUBCUTANEOUS
  Administered 2022-01-07: 5 [IU] via SUBCUTANEOUS

## 2022-01-05 MED ORDER — DOCUSATE SODIUM 100 MG PO CAPS
200.0000 mg | ORAL_CAPSULE | Freq: Every day | ORAL | Status: DC
  Administered 2022-01-06 – 2022-01-15 (×10): 200 mg via ORAL
  Filled 2022-01-05 (×10): qty 2

## 2022-01-05 NOTE — ED Notes (Signed)
Doctor notified of lactic lab value

## 2022-01-05 NOTE — ED Provider Notes (Signed)
[facility] Provider Note   CSN: [id number] Arrival date & time: 01/05/22  1759     History  Chief Complaint  Patient presents with   Pneumonia   UTI    Renee [name] is a 88 y.o. female.  HPI     88 year old female with a history of type 2 diabetes, hypertension, hyperlipidemia, LVH, CVA, OSA, asthma who presents with concern for cough and generalized weakness.  Cough began about 2 days ago, and feels productive but is unable to raise mucus most the time.  She has coughed up some green mucus.  No known fevers at home, being EMS had reported that she felt warm.  She denies having shortness of breath, reports she had generalized weakness and increased wheezing.  She denies any history of lung disease or asthma, but this has been noted by her primary care physician and she is prescribed nebulizer.  Reports she has a nebulizer at home with temporary relief.  Reports some nausea.  Denies leg pain or swelling. Has significant fatigue. Some chest tightness.     Past Medical History:  Diagnosis Date   Angioedema    possibly from voltaren   Bronchopneumonia 12/11/2016   Degenerative disc disease    Diabetes mellitus    type II   Hyperlipidemia    Hypertension    LVH (left ventricular hypertrophy)    and atrial enlargement by echo in past with nl EF   Nasal pruritis    Osteoarthritis    Osteopenia    Renal insufficiency    Sleep apnea    Stroke ([place]) 05/2010   Small vessel sobcortical (in [place] trial) with Dr Leonie Man, residual L hemiparesis   Vitamin B 12 deficiency 04/08    Home Medications Prior to Admission medications   Medication Sig Start Date End Date Taking? Authorizing Provider  acetaminophen (TYLENOL) 500 MG tablet Take 1,000 mg by mouth at bedtime.   Yes [provider]  albuterol (VENTOLIN HFA) 108 (90 Base) MCG/ACT inhaler INHALE 2 PUFFS INTO THE LUNGS EVERY 4 HOURS AS NEEDED FOR WHEEZING/SHORTNESS OF  BREATH Patient taking differently: Inhale 2 puffs into the lungs every 4 (four) hours as needed for shortness of breath or wheezing. 10/21/20  Yes Tower, Wynelle Fanny, MD  ALPRAZolam [name]) 0.5 MG tablet Take 1 tablet (0.5 mg total) by mouth 2 (two) times daily. TAKE 1/2 TO 1 TABLET BY MOUTH TWICE A DAY AS NEEDED FOR ANXIETY Strength: 0.5 mg Patient taking differently: Take 0.5 mg by mouth 2 (two) times daily as needed for anxiety. 09/01/21  Yes Vann, Jessica U, DO  amLODipine (NORVASC) 10 MG tablet TAKE 1 TABLET BY MOUTH EVERY DAY Patient taking differently: Take 10 mg by mouth daily. 11/14/21  Yes [name], MD  aspirin EC 325 MG tablet Take 325 mg by mouth in the morning.   Yes [provider]  calcium-vitamin D (OSCAL WITH D) 500-200 MG-UNIT per tablet Take 1 tablet by mouth daily.   Yes [provider]  cephALEXin (KEFLEX) 500 MG capsule Take 500 mg by mouth daily. Continuous 11/11/21  Yes [provider]  Cholecalciferol (VITAMIN D-3) 25 MCG (1000 UT) CAPS Take 1,000 Units by mouth in the morning.   Yes [provider]  Cranberry 500 MG TABS Take 500 mg by mouth in the morning and at bedtime.   Yes [provider]  docusate sodium (COLACE) 100 MG capsule Take 200 mg by mouth at bedtime.  Yes [provider]  estradiol (ESTRACE) 0.1 MG/GM vaginal cream Place 1 g vaginally daily. 04/25/21  Yes [provider]  FLONASE 50 MCG/ACT nasal spray Place 2 sprays into both nostrils See admin instructions. Instill 2 sprays into each nostril at bedtime   Yes [provider]  furosemide (LASIX) 20 MG tablet TAKE 1/2 TABLET BY MOUTH EVERY DAY Patient taking differently: Take 10 mg by mouth daily. 12/11/21  Yes Sood, Elisabeth Cara, MD  glipiZIDE (GLUCOTROL XL) 5 MG 24 hr tablet TAKE 1 TABLET BY MOUTH EVERY DAY WITH BREAKFAST Patient taking differently: Take 5 mg by mouth daily with breakfast. 12/11/21  Yes [name], MD  hydrALAZINE  (APRESOLINE) 100 MG tablet Take 1 tablet (100 mg total) by mouth 3 (three) times daily. 09/28/20  Yes [name], MD  hydrochlorothiazide (MICROZIDE) 12.5 MG capsule TAKE 1 CAPSULE BY MOUTH EVERY DAY Patient taking differently: Take 12.5 mg by mouth daily. 12/11/21  Yes Pickard, Cammie Mcgee, MD  JANUVIA 100 MG tablet TAKE 1 TABLET BY MOUTH EVERY DAY Patient taking differently: Take 100 mg by mouth daily. 11/20/21  Yes [name], MD  metoprolol succinate (TOPROL-XL) 25 MG 24 hr tablet Take 12.5 mg by mouth 2 (two) times daily. 09/16/21  Yes [provider]  mirtazapine (REMERON) 15 MG tablet Take 1 tablet (15 mg total) by mouth at bedtime. 05/26/21  Yes [name], MD  Multiple Vitamin (MULTIVITAMIN) capsule Take 1 capsule by mouth daily.   Yes [provider]  Multiple Vitamins-Minerals (PRESERVISION AREDS 2+MULTI VIT) CAPS Take 1 capsule by mouth in the morning and at bedtime.   Yes [provider]  Olopatadine HCl (PATADAY OP) Place 1 drop into both eyes 2 (two) times daily as needed (for itching or redness).   Yes [provider]  pioglitazone (ACTOS) 30 MG tablet Take 1 tablet (30 mg total) by mouth daily. 09/21/21  Yes [name], MD  sertraline (ZOLOFT) 50 MG tablet Take 1 tablet (50 mg total) by mouth daily. 12/11/21  Yes [name], MD  solifenacin (VESICARE) 10 MG tablet Take 1 tablet (10 mg total) by mouth daily. 12/25/21  Yes [name], MD  trandolapril (MAVIK) 4 MG tablet TAKE 1 TABLET DAILY Patient taking differently: Take 4 mg by mouth daily. 10/30/21  Yes [name], MD  triamcinolone (NASACORT) 55 MCG/ACT AERO nasal inhaler Place 2 sprays into the nose daily. Patient taking differently: Place 2 sprays into the nose See admin instructions. Instill 2 sprays into each nostril at bedtime when not using Flonase 03/28/20  Yes [name], MD  vitamin B-12 (CYANOCOBALAMIN) 1000 MCG tablet Take 1,000 mcg by mouth  daily.    Yes [provider]  Blood Glucose Monitoring Suppl (ONE TOUCH ULTRA 2) w/Device KIT Check blood sugar once daily and as directed. Dx E11.9 08/27/18   Tower, Wynelle Fanny, MD  Continuous Blood Gluc Receiver (FREESTYLE LIBRE 2 READER) DEVI 1 each by Does not apply route as directed. 10/27/21   [name], MD  Continuous Blood Gluc Sensor (FREESTYLE LIBRE 2 SENSOR) MISC 1 each by Does not apply route as directed. 10/27/21   [name], MD  glucose blood (ONETOUCH ULTRA) test strip CHECK BLOOD SUGAR ONCE DAILY AND AS DIRECTED. 10/18/21   [name], MD  Lancets [facility] DELICA PLUS [id number]) MISC CHECK BLOOD SUGAR ONCE DAILY AND AS DIRECTED 08/01/21   [name], MD  Allergies    Tape and Voltaren [diclofenac]    Review of Systems   Review of Systems  Physical Exam Updated Vital Signs BP (!) 146/62 (BP Location: Right Arm)   Pulse 65   Temp 98.2 F (36.8 C) (Oral)   Resp 18   Wt 81 kg   SpO2 97%   BMI 31.63 kg/m  Physical Exam Vitals and nursing note reviewed.  Constitutional:      General: She is not in acute distress.    Appearance: She is well-developed. She is not diaphoretic.  HENT:     Head: Normocephalic and atraumatic.  Eyes:     Conjunctiva/sclera: Conjunctivae normal.  Cardiovascular:     Rate and Rhythm: Normal rate and regular rhythm.     Heart sounds: Normal heart sounds. No murmur heard.   No friction rub. No gallop.  Pulmonary:     Effort: Pulmonary effort is normal. No respiratory distress.     Breath sounds: Wheezing present. No rales.  Abdominal:     General: There is no distension.     Palpations: Abdomen is soft.     Tenderness: There is no abdominal tenderness. There is no guarding.  Musculoskeletal:        General: No tenderness.     Cervical back: Normal range of motion.  Skin:    General: Skin is warm and dry.     Findings: No erythema or rash.  Neurological:     Mental Status: She is alert and oriented  to person, place, and time.    ED Results / Procedures / Treatments   Labs (all labs ordered are listed, but only abnormal results are displayed) Labs Reviewed  CBC WITH DIFFERENTIAL/PLATELET - Abnormal; Notable for the following components:      Result Value   RBC 3.78 (*)    Hemoglobin 10.6 (*)    HCT 34.1 (*)    RDW 15.7 (*)    Platelets 135 (*)    Neutro Abs 8.8 (*)    Lymphs Abs 0.4 (*)    All other components within normal limits  COMPREHENSIVE METABOLIC PANEL - Abnormal; Notable for the following components:   Glucose, Bld 351 (*)    BUN 32 (*)    Creatinine, Ser 1.82 (*)    Calcium 8.8 (*)    Total Protein 5.8 (*)    Albumin 3.3 (*)    GFR, Estimated 26 (*)    All other components within normal limits  LACTIC ACID, PLASMA - Abnormal; Notable for the following components:   Lactic Acid, Venous 2.8 (*)    All other components within normal limits  LACTIC ACID, PLASMA - Abnormal; Notable for the following components:   Lactic Acid, Venous 2.1 (*)    All other components within normal limits  BRAIN NATRIURETIC PEPTIDE - Abnormal; Notable for the following components:   B Natriuretic Peptide 243.4 (*)    All other components within normal limits  COMPREHENSIVE METABOLIC PANEL - Abnormal; Notable for the following components:   Glucose, Bld 333 (*)    BUN 36 (*)    Creatinine, Ser 2.12 (*)    Calcium 8.6 (*)    Total Protein 5.3 (*)    Albumin 2.9 (*)    GFR, Estimated 22 (*)    All other components within normal limits  CBC WITH DIFFERENTIAL/PLATELET - Abnormal; Notable for the following components:   RBC 3.43 (*)    Hemoglobin 9.8 (*)    HCT 30.6 (*)  RDW 15.6 (*)    Platelets 120 (*)    Lymphs Abs 0.4 (*)    All other components within normal limits  BLOOD GAS, VENOUS - Abnormal; Notable for the following components:   pO2, Ven 67 (*)    All other components within normal limits  GLUCOSE, CAPILLARY - Abnormal; Notable for the following components:    Glucose-Capillary 411 (*)    All other components within normal limits  GLUCOSE, CAPILLARY - Abnormal; Notable for the following components:   Glucose-Capillary 355 (*)    All other components within normal limits  GLUCOSE, CAPILLARY - Abnormal; Notable for the following components:   Glucose-Capillary 289 (*)    All other components within normal limits  GLUCOSE, CAPILLARY - Abnormal; Notable for the following components:   Glucose-Capillary 223 (*)    All other components within normal limits  GLUCOSE, CAPILLARY - Abnormal; Notable for the following components:   Glucose-Capillary 217 (*)    All other components within normal limits  GLUCOSE, CAPILLARY - Abnormal; Notable for the following components:   Glucose-Capillary 273 (*)    All other components within normal limits  CULTURE, BLOOD (ROUTINE X 2)  CULTURE, BLOOD (ROUTINE X 2)  RESP PANEL BY RT-PCR (FLU A&B, COVID) ARPGX2  PHOSPHORUS  MAGNESIUM  MAGNESIUM  LACTIC ACID, PLASMA  PROCALCITONIN  CK  TSH  URINALYSIS, ROUTINE W REFLEX MICROSCOPIC  STREP PNEUMONIAE URINARY ANTIGEN  METHYLMALONIC ACID, SERUM  CALCIUM, IONIZED  SODIUM, URINE, RANDOM  CREATININE, URINE, RANDOM  URINALYSIS, ROUTINE W REFLEX MICROSCOPIC  BASIC METABOLIC PANEL  CBC  MAGNESIUM  TROPONIN I (HIGH SENSITIVITY)  TROPONIN I (HIGH SENSITIVITY)    EKG EKG Interpretation  Date/Time:  Friday Jan 05 2022 18:05:21 EDT Ventricular Rate:  79 PR Interval:  150 QRS Duration: 92 QT Interval:  400 QTC Calculation: 470 R Axis:   257 Text Interpretation: Sinus rhythm Left anterior fascicular block Consider right ventricular hypertrophy Borderline T abnormalities, anterior leads No significant change since last tracing Confirmed by Gareth Morgan [phone]) on 01/05/2022 8:54:01 PM  Radiology DG Chest Portable 1 View  Result Date: 01/05/2022 CLINICAL DATA:  Cough. EXAM: PORTABLE CHEST 1 VIEW COMPARISON:  Chest x-ray 08/29/2021. FINDINGS: There is some left  basilar opacities silhouetting the hemidiaphragm. Questionable small left pleural effusion. Right lung is clear. The heart is enlarged, unchanged. No pneumothorax or acute fracture. IMPRESSION: 1. Left basilar atelectasis/airspace disease with questionable small left pleural effusion. 2. Stable cardiomegaly. Electronically Signed   By: Ronney Asters M.D.   On: 01/05/2022 19:19   ECHOCARDIOGRAM COMPLETE  Result Date: 01/06/2022    ECHOCARDIOGRAM REPORT   Patient Name:   Renee [name] Date of Exam: 01/06/2022 Medical Rec #:  [id number]      Height:       63.0 in Accession #:    [id number]     Weight:       178.6 lb Date of Birth:  28-Apr-1929      BSA:          1.843 m Patient Age:    77 years       BP:           149/65 mmHg Patient Gender: F              HR:           66 bpm. Exam Location:  Inpatient Procedure: 2D Echo, Cardiac Doppler and Color Doppler Indications:    CHF  History:  Patient has prior history of Echocardiogram examinations, most                 recent 06/09/2020. CHF; Risk Factors:Hypertension and Diabetes.  Sonographer:    Joette Catching RCS Referring Phys: [id number] [place]  1. Left ventricular ejection fraction, by estimation, is 65 to 70%. The left ventricle has normal function. The left ventricle has no regional wall motion abnormalities. There is moderate asymmetric left ventricular hypertrophy of the septal segment. Left ventricular diastolic parameters are consistent with Grade II diastolic dysfunction (pseudonormalization). Elevated left atrial pressure.  2. Right ventricular systolic function is normal. The right ventricular size is mildly enlarged. Tricuspid regurgitation signal is inadequate for assessing PA pressure.  3. Left atrial size was severely dilated.  4. The vaena cava still collapses >50% with inspiration and there is no diastolic chamber collapse. Moderate pericardial effusion. There is no evidence of cardiac tamponade.  5. The mitral valve is  normal in structure. No evidence of mitral valve regurgitation.  6. The aortic valve is tricuspid. Aortic valve regurgitation is not visualized. No aortic stenosis is present.  7. The inferior vena cava is dilated in size with >50% respiratory variability, suggesting right atrial pressure of 8 mmHg. Comparison(s): The left ventricular function is unchanged. The left ventricular diastolic function is significantly worse. There is evidence of increased right and left heart filling pressures. Ther pericardial effusion is larger, and there are some early findings of tamponade. Conclusion(s)/Recommendation(s): Pericardiocentesis does not appear indicated at this time, but it may be indicated to repeat limited echo for evaluation of the pericardial effusion. FINDINGS  Left Ventricle: Left ventricular ejection fraction, by estimation, is 65 to 70%. The left ventricle has normal function. The left ventricle has no regional wall motion abnormalities. The left ventricular internal cavity size was normal in size. There is  moderate asymmetric left ventricular hypertrophy of the septal segment. Left ventricular diastolic parameters are consistent with Grade II diastolic dysfunction (pseudonormalization). Elevated left atrial pressure. Right Ventricle: The right ventricular size is mildly enlarged. No increase in right ventricular wall thickness. Right ventricular systolic function is normal. Tricuspid regurgitation signal is inadequate for assessing PA pressure. Left Atrium: Left atrial size was severely dilated. Right Atrium: Right atrial size was normal in size. Pericardium: The vaena cava still collapses >50% with inspiration and there is no diastolic chamber collapse. A moderately sized pericardial effusion is present. There is excessive respiratory variation in the mitral valve spectral Doppler velocities and  excessive respiratory variation in the tricuspid valve spectral Doppler velocities. There is no evidence of  cardiac tamponade. Mitral Valve: The mitral valve is normal in structure. No evidence of mitral valve regurgitation. Tricuspid Valve: The tricuspid valve is normal in structure. Tricuspid valve regurgitation is not demonstrated. Aortic Valve: The aortic valve is tricuspid. Aortic valve regurgitation is not visualized. No aortic stenosis is present. Aortic valve mean gradient measures 9.0 mmHg. Aortic valve peak gradient measures 17.5 mmHg. Aortic valve area, by VTI measures 2.89  cm. Pulmonic Valve: The pulmonic valve was grossly normal. Pulmonic valve regurgitation is not visualized. Aorta: The aortic root and ascending aorta are structurally normal, with no evidence of dilitation. Venous: The inferior vena cava is dilated in size with greater than 50% respiratory variability, suggesting right atrial pressure of 8 mmHg. IAS/Shunts: No atrial level shunt detected by color flow Doppler.  LEFT VENTRICLE PLAX 2D LVIDd:         4.70 cm   Diastology LVIDs:  2.80 cm   LV e' medial:    4.03 cm/s LV PW:         1.00 cm   LV E/e' medial:  31.5 LV IVS:        1.70 cm   LV e' lateral:   4.68 cm/s LVOT diam:     2.00 cm   LV E/e' lateral: 27.1 LV SV:         138 LV SV Index:   75 LVOT Area:     3.14 cm  RIGHT VENTRICLE             IVC RV Basal diam:  4.40 cm     IVC diam: 2.40 cm RV Mid diam:    3.00 cm RV S prime:     15.70 cm/s TAPSE (M-mode): 1.7 cm LEFT ATRIUM             Index        RIGHT ATRIUM           Index LA diam:        5.00 cm 2.71 cm/m   RA Area:     15.20 cm LA Vol (A2C):   66.5 ml 36.09 ml/m  RA Volume:   38.80 ml  21.06 ml/m LA Vol (A4C):   87.2 ml 47.32 ml/m LA Biplane Vol: 83.0 ml 45.04 ml/m  AORTIC VALVE                     PULMONIC VALVE AV Area (Vmax):    2.69 cm      PV Vmax:       1.33 m/s AV Area (Vmean):   2.68 cm      PV Peak grad:  7.1 mmHg AV Area (VTI):     2.89 cm AV Vmax:           209.00 cm/s AV Vmean:          142.000 cm/s AV VTI:            0.477 m AV Peak Grad:      17.5  mmHg AV Mean Grad:      9.0 mmHg LVOT Vmax:         179.00 cm/s LVOT Vmean:        121.000 cm/s LVOT VTI:          0.439 m LVOT/AV VTI ratio: 0.92  AORTA Ao Root diam: 3.00 cm Ao Asc diam:  3.20 cm MITRAL VALVE MV Area (PHT): 4.54 cm     SHUNTS MV Decel Time: 167 msec     Systemic VTI:  0.44 m MV E velocity: 127.00 cm/s  Systemic Diam: 2.00 cm MV A velocity: 115.00 cm/s MV E/A ratio:  1.10 Mihai Croitoru MD Electronically signed by Sanda Klein MD Signature Date/Time: 01/06/2022/4:45:51 PM    Final     Procedures Procedures    Medications Ordered in ED Medications  acetaminophen (TYLENOL) tablet 650 mg (650 mg Oral Given 01/05/22 2215)    Or  acetaminophen (TYLENOL) suppository 650 mg ( Rectal See Alternative 01/05/22 2215)  insulin aspart (novoLOG) injection 0-9 Units (3 Units Subcutaneous Given 01/06/22 1740)  insulin aspart (novoLOG) injection 0-5 Units (3 Units Subcutaneous Given 01/06/22 2151)  albuterol (PROVENTIL) (2.5 MG/3ML) 0.083% nebulizer solution 2.5 mg (has no administration in time range)  cefTRIAXone (ROCEPHIN) 2 g in sodium chloride 0.9 % 100 mL IVPB (2 g Intravenous New Bag/Given 01/06/22 2104)  aspirin EC tablet 325 mg (325 mg  Oral Given 01/06/22 1018)  docusate sodium (COLACE) capsule 200 mg (200 mg Oral Given 01/06/22 2100)  fluticasone (FLONASE) 50 MCG/ACT nasal spray 2 spray (2 sprays Each Nare Given 01/06/22 2101)  lactated ringers infusion ( Intravenous New Bag/Given 01/06/22 0534)  predniSONE (DELTASONE) tablet 50 mg (has no administration in time range)  ALPRAZolam [name]) tablet 0.5 mg (0.5 mg Oral Given 01/06/22 2107)  azithromycin (ZITHROMAX) tablet 500 mg (500 mg Oral Given 01/06/22 1741)  methylPREDNISolone sodium succinate (SOLU-MEDROL) 125 mg/2 mL injection 125 mg (125 mg Intravenous Given 01/05/22 1919)  ipratropium-albuterol (DUONEB) 0.5-2.5 (3) MG/3ML nebulizer solution 3 mL (3 mLs Nebulization Given 01/05/22 2001)  insulin aspart (novoLOG) injection 7 Units (7 Units  Subcutaneous Given 01/05/22 2333)  magnesium sulfate IVPB 2 g 50 mL (2 g Intravenous New Bag/Given 01/06/22 0536)    ED Course/ Medical Decision Making/ A&P                           Medical Decision Making Amount and/or Complexity of Data Reviewed External Data Reviewed: labs and notes. Labs: ordered. Decision-making details documented in ED Course. Radiology: ordered and independent interpretation performed. Decision-making details documented in ED Course. ECG/medicine tests: ordered and independent interpretation performed. Decision-making details documented in ED Course.  Risk Prescription drug management. Decision regarding hospitalization.    88 year old female with a history of type 2 diabetes, hypertension, hyperlipidemia, LVH, CVA, OSA, asthma who presents with concern for cough and generalized weakness.  Differential diagnosis for dyspnea includes ACS, PE, COPD exacerbation, CHF exacerbation, anemia, pneumonia, viral etiology such as COVID 19 infection, metabolic abnormality.  Chest x-ray was done and personally interpreted by me which showed basilar opacities , questionable small left pleural effusion. EKG was evaluated by me which showed no acute changes.  BNP was slightly elevated. Troponin negative, doubt ACS.  Clinically, suspect pneumonia. Given rocephin/azithromycin and blood cx ordered. Lactate elevated however BNP also elevated slightly and will not give 30cc/kg given unclear if lactic acid elevation secondary to sepsis or respiratory distress. She was given solumedrol/duonebs for wheezing.  Oxygen saturations down to 85%.  Admitted to the hospitalist service with concern for hypoxic respiratory failure in the setting of suspected community-acquired pneumonia with asthma.            Final Clinical Impression(s) / ED Diagnoses Final diagnoses:  Community acquired pneumonia of left lower lobe of lung  Acute respiratory failure with hypoxia [facility])    Rx / DC  Orders ED Discharge Orders     None         Gareth Morgan, MD 01/07/22 0025

## 2022-01-05 NOTE — ED Notes (Signed)
MD Howerter and 5N RN made aware of pt temp - PRN to be given

## 2022-01-05 NOTE — ED Triage Notes (Signed)
Pt BIB [facility] from home as d/t family having concerns that pt may have pneumonia again & possible UTI since she has a strong urine small. EMS reports family stated on scene that pt frequently has PNE & her lung sounds were rhonchi in upper lobes & wheezing all throughout. She had 2 DuoNebs at home & they did help. A/Ox4, HOH,160/63, 80 bpm, 92% on 3L O2,CBG 416, Temp 101.

## 2022-01-06 ENCOUNTER — Inpatient Hospital Stay (HOSPITAL_COMMUNITY): Payer: Medicare HMO

## 2022-01-06 ENCOUNTER — Encounter (HOSPITAL_COMMUNITY): Payer: Self-pay | Admitting: Internal Medicine

## 2022-01-06 DIAGNOSIS — I5032 Chronic diastolic (congestive) heart failure: Secondary | ICD-10-CM | POA: Diagnosis present

## 2022-01-06 DIAGNOSIS — E669 Obesity, unspecified: Secondary | ICD-10-CM | POA: Diagnosis present

## 2022-01-06 DIAGNOSIS — J189 Pneumonia, unspecified organism: Secondary | ICD-10-CM | POA: Diagnosis not present

## 2022-01-06 DIAGNOSIS — I5033 Acute on chronic diastolic (congestive) heart failure: Secondary | ICD-10-CM | POA: Diagnosis present

## 2022-01-06 DIAGNOSIS — A419 Sepsis, unspecified organism: Secondary | ICD-10-CM | POA: Diagnosis not present

## 2022-01-06 DIAGNOSIS — J45901 Unspecified asthma with (acute) exacerbation: Secondary | ICD-10-CM | POA: Diagnosis present

## 2022-01-06 DIAGNOSIS — J309 Allergic rhinitis, unspecified: Secondary | ICD-10-CM | POA: Diagnosis present

## 2022-01-06 LAB — BLOOD GAS, VENOUS
Acid-Base Excess: 1.5 mmol/L (ref 0.0–2.0)
Bicarbonate: 27.2 mmol/L (ref 20.0–28.0)
O2 Saturation: 92.2 %
Patient temperature: 37.6
pCO2, Ven: 47 mmHg (ref 44–60)
pH, Ven: 7.37 (ref 7.25–7.43)
pO2, Ven: 67 mmHg — ABNORMAL HIGH (ref 32–45)

## 2022-01-06 LAB — ECHOCARDIOGRAM COMPLETE
AR max vel: 2.69 cm2
AV Area VTI: 2.89 cm2
AV Area mean vel: 2.68 cm2
AV Mean grad: 9 mmHg
AV Peak grad: 17.5 mmHg
Ao pk vel: 2.09 m/s
Area-P 1/2: 4.54 cm2
S' Lateral: 2.8 cm
Weight: 2857.16 oz

## 2022-01-06 LAB — COMPREHENSIVE METABOLIC PANEL
ALT: 17 U/L (ref 0–44)
AST: 21 U/L (ref 15–41)
Albumin: 2.9 g/dL — ABNORMAL LOW (ref 3.5–5.0)
Alkaline Phosphatase: 63 U/L (ref 38–126)
Anion gap: 10 (ref 5–15)
BUN: 36 mg/dL — ABNORMAL HIGH (ref 8–23)
CO2: 25 mmol/L (ref 22–32)
Calcium: 8.6 mg/dL — ABNORMAL LOW (ref 8.9–10.3)
Chloride: 105 mmol/L (ref 98–111)
Creatinine, Ser: 2.12 mg/dL — ABNORMAL HIGH (ref 0.44–1.00)
GFR, Estimated: 22 mL/min — ABNORMAL LOW (ref >60)
Glucose, Bld: 333 mg/dL — ABNORMAL HIGH (ref 70–99)
Potassium: 4.1 mmol/L (ref 3.5–5.1)
Sodium: 140 mmol/L (ref 135–145)
Total Bilirubin: 0.5 mg/dL (ref 0.3–1.2)
Total Protein: 5.3 g/dL — ABNORMAL LOW (ref 6.5–8.1)

## 2022-01-06 LAB — CBC WITH DIFFERENTIAL/PLATELET
Abs Immature Granulocytes: 0.06 10*3/uL (ref 0.00–0.07)
Basophils Absolute: 0 10*3/uL (ref 0.0–0.1)
Basophils Relative: 0 %
Eosinophils Absolute: 0 10*3/uL (ref 0.0–0.5)
Eosinophils Relative: 0 %
HCT: 30.6 % — ABNORMAL LOW (ref 36.0–46.0)
Hemoglobin: 9.8 g/dL — ABNORMAL LOW (ref 12.0–15.0)
Immature Granulocytes: 1 %
Lymphocytes Relative: 5 %
Lymphs Abs: 0.4 10*3/uL — ABNORMAL LOW (ref 0.7–4.0)
MCH: 28.6 pg (ref 26.0–34.0)
MCHC: 32 g/dL (ref 30.0–36.0)
MCV: 89.2 fL (ref 80.0–100.0)
Monocytes Absolute: 0.1 10*3/uL (ref 0.1–1.0)
Monocytes Relative: 1 %
Neutro Abs: 7.1 10*3/uL (ref 1.7–7.7)
Neutrophils Relative %: 93 %
Platelets: 120 10*3/uL — ABNORMAL LOW (ref 150–400)
RBC: 3.43 MIL/uL — ABNORMAL LOW (ref 3.87–5.11)
RDW: 15.6 % — ABNORMAL HIGH (ref 11.5–15.5)
WBC: 7.7 10*3/uL (ref 4.0–10.5)
nRBC: 0 % (ref 0.0–0.2)

## 2022-01-06 LAB — TSH: TSH: 1.27 u[IU]/mL (ref 0.350–4.500)

## 2022-01-06 LAB — GLUCOSE, CAPILLARY
Glucose-Capillary: 355 mg/dL — ABNORMAL HIGH (ref 70–99)
Glucose-Capillary: 289 mg/dL — ABNORMAL HIGH (ref 70–99)
Glucose-Capillary: 223 mg/dL — ABNORMAL HIGH (ref 70–99)
Glucose-Capillary: 217 mg/dL — ABNORMAL HIGH (ref 70–99)
Glucose-Capillary: 273 mg/dL — ABNORMAL HIGH (ref 70–99)

## 2022-01-06 LAB — PROCALCITONIN: Procalcitonin: 0.35 ng/mL

## 2022-01-06 LAB — PHOSPHORUS: Phosphorus: 3.4 mg/dL (ref 2.5–4.6)

## 2022-01-06 LAB — CK: Total CK: 44 U/L (ref 38–234)

## 2022-01-06 LAB — MAGNESIUM: Magnesium: 1.8 mg/dL (ref 1.7–2.4)

## 2022-01-06 LAB — LACTIC ACID, PLASMA: Lactic Acid, Venous: 0.9 mmol/L (ref 0.5–1.9)

## 2022-01-06 MED ORDER — PREDNISONE 20 MG PO TABS
50.0000 mg | ORAL_TABLET | Freq: Every day | ORAL | Status: DC
  Administered 2022-01-07: 50 mg via ORAL
  Filled 2022-01-06: qty 1

## 2022-01-06 MED ORDER — LACTATED RINGERS IV SOLN: INTRAVENOUS | Status: AC

## 2022-01-06 MED ORDER — ALPRAZOLAM 0.5 MG PO TABS
0.5000 mg | ORAL_TABLET | Freq: Two times a day (BID) | ORAL | Status: DC | PRN
  Administered 2022-01-06 – 2022-01-13 (×7): 0.5 mg via ORAL
  Filled 2022-01-06 (×7): qty 1

## 2022-01-06 MED ORDER — AZITHROMYCIN 500 MG PO TABS
500.0000 mg | ORAL_TABLET | Freq: Every day | ORAL | Status: AC
  Administered 2022-01-06 – 2022-01-08 (×3): 500 mg via ORAL
  Filled 2022-01-06 (×3): qty 1

## 2022-01-06 MED ORDER — MAGNESIUM SULFATE 2 GM/50ML IV SOLN
2.0000 g | Freq: Once | INTRAVENOUS | Status: AC
  Administered 2022-01-06: 2 g via INTRAVENOUS
  Filled 2022-01-06: qty 50

## 2022-01-06 NOTE — Evaluation (Addendum)
Physical Therapy Evaluation Patient Details Name: Renee [name] MRN: [id number] DOB: 1929-03-11 Today's Date: 01/06/2022  History of Present Illness  Renee [name] is a 88 y.o. female who presented with geenralized weakness ongoing for 3 days. Pt admitted for  severe sepsis due to community-acquired pneumonia . Pt with medical history significant for type 2 diabetes mellitus, mild intermittent asthma, essential pretension, allergic rhinitis, chronic diastolic heart failure.  Clinical Impression  PTA, pt uses a walker for household ambulation and has an aide 7 days/wk, 8 hours/day to assist with IADL's/ADL's. Pt family fills in gaps as needed. Pt presents with generalized weakness, decreased activity tolerance and impaired balance. Pt ambulating 15 ft with a walker at a min assist level; SpO2 > 90% on 2L O2. Will benefit from follow up HHPT to address deficits.     Recommendations for follow up therapy are one component of a multi-disciplinary discharge planning process, led by the attending physician.  Recommendations may be updated based on patient status, additional functional criteria and insurance authorization.  Follow Up Recommendations Home health PT    Assistance Recommended at Discharge Frequent or constant Supervision/Assistance  Patient can return home with the following  A little help with walking and/or transfers;A little help with bathing/dressing/bathroom;Assistance with cooking/housework;Assist for transportation;Help with stairs or ramp for entrance    Equipment Recommendations None recommended by PT  Recommendations for Other Services       Functional Status Assessment Patient has had a recent decline in their functional status and demonstrates the ability to make significant improvements in function in a reasonable and predictable amount of time.     Precautions / Restrictions Precautions Precautions: Fall Precaution Comments: watch O2 Restrictions Weight Bearing  Restrictions: No      Mobility  Bed Mobility               General bed mobility comments: OOB in chair    Transfers Overall transfer level: Needs assistance Equipment used: Rolling walker (2 wheels) Transfers: Sit to/from Stand Sit to Stand: Min assist           General transfer comment: MinA to power up to stand from recline x 2    Ambulation/Gait Ambulation/Gait assistance: Min assist, +2 safety/equipment Gait Distance (Feet): 15 Feet Assistive device: Rolling walker (2 wheels) Gait Pattern/deviations: Step-through pattern, Decreased stride length, Shuffle Gait velocity: decreased Gait velocity interpretation: <1.8 ft/sec, indicate of risk for recurrent falls   General Gait Details: Bilateral knee instability, requiring minA for stability, chair follow utilized  Science writer    Modified Rankin (Stroke Patients Only)       Balance Overall balance assessment: Needs assistance Sitting-balance support: Feet supported Sitting balance-Leahy Scale: Fair     Standing balance support: Bilateral upper extremity supported Standing balance-Leahy Scale: Poor                               Pertinent Vitals/Pain Pain Assessment Pain Assessment: No/denies pain    Home Living Family/patient expects to be discharged to:: Private residence Living Arrangements: Other (Comment) Available Help at Discharge: Personal care attendant Type of Home: House Home Access: Ramped entrance       Home Layout: One level Home Equipment: Advice worker (2 wheels);Transport chair;BSC/3in1;Grab bars - tub/shower;Rollator (4 wheels) Additional Comments: lives alone with PCAs 5 days/wk for 8 hrs, Sat/Sun for 4 hours - assist  from sister in law outside of those times if needed    Prior Function Prior Level of Function : Needs assist             Mobility Comments: ambulates with RW and assist from aide ADLs Comments: Aide  assists with all ADLs as needed, pt reports she tries to be as much as she can     Hand Dominance   Dominant Hand: Right    Extremity/Trunk Assessment   Upper Extremity Assessment Upper Extremity Assessment: Defer to OT evaluation    Lower Extremity Assessment Lower Extremity Assessment: RLE deficits/detail;LLE deficits/detail RLE Deficits / Details: Grossly 4/5 except hip flexion 2+/5 LLE Deficits / Details: Grossly 4/5 except hip flexion 2+/5    Cervical / Trunk Assessment Cervical / Trunk Assessment: Kyphotic  Communication   Communication: HOH  Cognition Arousal/Alertness: Awake/alert Behavior During Therapy: Flat affect Overall Cognitive Status: Impaired/Different from baseline Area of Impairment: Attention, Following commands, Problem solving                   Current Attention Level: Sustained   Following Commands: Follows one step commands with increased time     Problem Solving: Slow processing, Requires verbal cues General Comments: incr time and cues required for all tasks        General Comments General comments (skin integrity, edema, etc.): VSS on 3L O2, sister present    Exercises General Exercises - Lower Extremity Ankle Circles/Pumps: Both, 10 reps, Seated   Assessment/Plan    PT Assessment Patient needs continued PT services  PT Problem List Decreased strength;Decreased activity tolerance;Decreased balance;Decreased mobility       PT Treatment Interventions DME instruction;Gait training;Stair training;Functional mobility training;Therapeutic activities;Therapeutic exercise;Balance training;Patient/family education    PT Goals (Current goals can be found in the Care Plan section)  Acute Rehab PT Goals Patient Stated Goal: to go home PT Goal Formulation: With patient Time For Goal Achievement: 01/20/22 Potential to Achieve Goals: Good    Frequency Min 3X/week     Co-evaluation               AM-PAC PT "6 Clicks" Mobility   Outcome Measure Help needed turning from your back to your side while in a flat bed without using bedrails?: A Little Help needed moving from lying on your back to sitting on the side of a flat bed without using bedrails?: A Little Help needed moving to and from a bed to a chair (including a wheelchair)?: A Little Help needed standing up from a chair using your arms (e.g., wheelchair or bedside chair)?: A Little Help needed to walk in hospital room?: A Little Help needed climbing 3-5 steps with a railing? : A Lot 6 Click Score: 17    End of Session Equipment Utilized During Treatment: Gait belt;Oxygen Activity Tolerance: Patient tolerated treatment well Patient left: in chair;with call bell/phone within reach;with chair alarm set;with family/visitor present Nurse Communication: Mobility status PT Visit Diagnosis: Unsteadiness on feet (R26.81);Muscle weakness (generalized) (M62.81);Difficulty in walking, not elsewhere classified (R26.2)    Time: 3419-3790 PT Time Calculation (min) (ACUTE ONLY): 27 min   Charges:   PT Evaluation $PT Eval Moderate Complexity: 1 Mod PT Treatments $Therapeutic Activity: 8-22 mins        Wyona Almas, PT, DPT Acute Rehabilitation Services Pager [phone] Office [phone]   Deno Etienne 01/06/2022, 12:41 PM

## 2022-01-06 NOTE — Progress Notes (Signed)
  Progress Note Patient: Renee [name] YIF:[id number] DOB: 1928-10-05 DOA: 01/05/2022  DOS: the patient was seen and examined on 01/06/2022  Brief hospital course: PMH of type II DM, HTN, chronic HFpEF, OSA, obesity.  Presents with complaints of shortness of breath and cough found to have pneumonia as well as possible mild exacerbation of asthma.  Assessment and Plan: * Sepsis due to pneumonia ([place]) Acute hypoxic respiratory failure. Mild asthma exacerbation. With fever, tachypnea and elevated lactic acid meets SIRS criteria.  Chest x-ray showing evidence of pneumonia with elevated procalcitonin minutes criteria for infection. O2 saturation 88% on room air with tachypnea on admission. Continue with IV antibiotics. Follow-up on cultures. Patient was started on aggressive regimen for steroids were tapered aggressively as well.  Continue nebulizer therapy.  Follow-up on blood cultures.  Follow-up on urinalysis.  Acute renal failure superimposed on stage 3b chronic kidney disease (HCC) Baseline serum creatinine around 1.3.  GFR more than 30 prior to admission. On admission serum creatinine 1.8 continues to trend up right now. Will monitor awaiting results of medication. If continues to go up we will perform further work-up tomorrow. Continue with IV fluids.  Chronic diastolic CHF (congestive heart failure) ([place]) While medical adequate. Currently monitoring and holding home Lasix.  Obesity (BMI 30-39.9) OSA Not on CPAP  Body mass index is 31.63 kg/m.  Placing the pt at higher risk of poor outcomes.  History of CVA (cerebrovascular accident) With left hemiparesis. Exam unchanged. Monitor.  PT OT recommends home with home health.  Anxiety and depression Continue home Xanax.  Essential hypertension Blood pressure stable.  Monitor.  Avoiding hypertensive medications.  Type 2 diabetes, controlled, with retinopathy ([place]) Hemoglobin A1c 6.82 weeks ago. Not on any insulin at  home. Continue sliding scale insulin for now.  Subjective: No nausea no vomiting no fever no chills.  Continues to have some cough.  Continues to have fatigue.  Physical Exam: Vitals:   01/06/22 0418 01/06/22 0805 01/06/22 0857 01/06/22 1319  BP: (!) 103/39 110/62  (!) 149/65  Pulse: 61 65  64  Resp:  19    Temp: 98.2 F (36.8 C) 97.6 F (36.4 C)  (!) 97.5 F (36.4 C)  TempSrc: Oral Oral  Oral  SpO2: 94% 96% 98% 100%  Weight:       General: Appear in mild distress; no visible Abnormal Neck Mass Or lumps, Conjunctiva normal Cardiovascular: S1 and S2 Present, no Murmur, Respiratory: good respiratory effort, Bilateral Air entry present and  faint Crackles, Occasional wheezes Abdomen: Bowel Sound present, Non tender  Extremities: trace Pedal edema Neurology: alert and oriented to time, place, and person Gait not checked due to patient safety concerns   Data Reviewed: I have Reviewed nursing notes, Vitals, and Lab results since pt's last encounter. Pertinent lab results CBC and BMP I have ordered test including CBC and BMP    Family Communication: None at bedside  Disposition: Status is: Inpatient Remains inpatient appropriate because: Receiving IV antibiotics.  Blood cultures pending.  Author: Berle Mull, MD 01/06/2022 3:07 PM  Please look on [email or web address] to find out who is on call.

## 2022-01-06 NOTE — Assessment & Plan Note (Deleted)
  #)   Chronic diastolic heart failure: documented history of such, with most recent echocardiogram performed in October 2021 notable for LV EF 65 to 70% as well as grade 1 diastolic dysfunction.  Clinically and radiographically, presentation appears less suggestive of acutely decompensated heart failure , although BNP is slightly elevated in the interval relative to most recent prior value, as further quantified above.  We will closely monitor for evidence of ensuing volume overload, and pursue echocardiogram in the morning, as further detailed above in the context of concomitant acute hypoxic respiratory distress.  Diuretic regimen at home consists of Lasix 10 mg p.o. daily.   Plan: monitor strict I's & O's and daily weights. Repeat BMP in AM.  Recheck serum mag level in the morning.  Hold home Lasix for now.  Echocardiogram in the morning.

## 2022-01-06 NOTE — Assessment & Plan Note (Deleted)
 #)   Generalized weakness: 2 to 3-day duration of generalized weakness, in the absence of any evidence of acute focal neurologic deficits, including no evidence of acute focal weakness.  Suspect contribution from physiologic stress stemming from presenting severe sepsis due to community-acquired pneumonia.  No e/o additional infectious process at this time, including negative COVID-19/influenza PCR, although resulted urinalysis is pending at this time..   In terms of other considered etiologies, acute ischemic CVA is felt to be less likely at this time in absence of any associated acute focal neuro deficits. Will further eval for any additional contributions from endocrine/metabolic sources, as detailed below.    Plan: work-up and management of presenting severe sepsis due to community-acquired pneumonia, as described above. PT/OT consults ordered for the AM. Fall precautions. CMP/CBC in the AM. Check TSH, MMA.  Follow-up result urinalysis.  Check CPK level, ionized calcium level.

## 2022-01-06 NOTE — Assessment & Plan Note (Deleted)
-   see severe sepsis 

## 2022-01-06 NOTE — Assessment & Plan Note (Addendum)
OSA Not on CPAP  Body mass index is 31.52 kg/m.  Placing the pt at higher risk of poor outcomes. -Patient noted to require nocturnal oxygen which was ordered. -Outpatient follow-up with pulmonary for sleep evaluation.

## 2022-01-06 NOTE — Assessment & Plan Note (Deleted)
  #)   Acute Kidney Injury on ckd 3b: Presenting serum creatinine noted to be 1.82 compared to 1.59 on 12/25/2021.  Suspect some contribution from prerenal etiology as a consequence of diminished renal perfusion as a consequence of diminished oxygen delivery capacity stemming from presenting acute hypoxic respiratory distress, as above, as well as mild intravascular depletion as consequence of presenting severe sepsis due to community-acquired pneumonia.   Plan: monitor strict I's & O's and daily weights. Attempt to avoid nephrotoxic agents. Refrain from NSAIDs. Repeat CMP in the morning.  Check urinalysis with microscopy.  Add-on random urine sodium and random urine creatinine.  Check CPK level.  Gentle IV fluids in the form of lactated Ringer's at 50 cc/h x 8 hours, closely monitoring for evidence of ensuing volume overload given history of chronic diastolic heart failure.  Hold home Vesicare.  Hold home ACE inhibitor.

## 2022-01-06 NOTE — Evaluation (Signed)
Occupational Therapy Evaluation Patient Details Name: Renee [name] MRN: [id number] DOB: November 25, 1928 Today's Date: 01/06/2022   History of Present Illness Renee [name] is a 88 y.o. female who presented with geenralized weakness ongoing for 3 days. Pt admitted for  severe sepsis due to community-acquired pneumonia . Pt with medical history significant for type 2 diabetes mellitus, mild intermittent asthma, essential pretension, allergic rhinitis, chronic diastolic heart failure.   Clinical Impression   Shannyn was evaluated s/p the above admission list. At baseline she receives assist with all daily functioning tasks at varying levels and uses a RW for ambulation. She has an aide 5-7 days a week, and her family fills in the gaps as needed. Upon evaluation pt required 3L nasal canula for OT sat to be >90%. She was limited by general weakness, fatigue, impaired condition and impaired balance. Overal she required mod A to stand and min A to take pivotal steps with the RW. Due to deficits listed below she currently requires up to min A for UB ADLs, and max A for LB ADLs. Pt will benefit from OT acutely. Recommend home with [place] and 24/7 assist   Recommendations for follow up therapy are one component of a multi-disciplinary discharge planning process, led by the attending physician.  Recommendations may be updated based on patient status, additional functional criteria and insurance authorization.   Follow Up Recommendations  Home health OT    Assistance Recommended at Discharge Frequent or constant Supervision/Assistance  Patient can return home with the following A lot of help with walking and/or transfers;A lot of help with bathing/dressing/bathroom;Assistance with cooking/housework;Assistance with feeding;Direct supervision/assist for medications management;Direct supervision/assist for financial management;Help with stairs or ramp for entrance    Functional Status Assessment  Patient has had a  recent decline in their functional status and demonstrates the ability to make significant improvements in function in a reasonable and predictable amount of time.  Equipment Recommendations  None recommended by OT    Recommendations for Other Services       Precautions / Restrictions Precautions Precautions: Fall Precaution Comments: watch O2 Restrictions Weight Bearing Restrictions: No      Mobility Bed Mobility Overal bed mobility: Needs Assistance Bed Mobility: Supine to Sit     Supine to sit: Min assist          Transfers Overall transfer level: Needs assistance Equipment used: Rolling walker (2 wheels) Transfers: Sit to/from Stand, Bed to chair/wheelchair/BSC Sit to Stand: Mod assist     Step pivot transfers: Min assist     General transfer comment: once pt got to the chiar, she stated "thats how I normally move at home." despite needing assist      Balance Overall balance assessment: Needs assistance Sitting-balance support: Feet supported Sitting balance-Leahy Scale: Fair     Standing balance support: Bilateral upper extremity supported Standing balance-Leahy Scale: Poor                             ADL either performed or assessed with clinical judgement   ADL Overall ADL's : Needs assistance/impaired Eating/Feeding: Set up;Sitting   Grooming: Set up;Sitting   Upper Body Bathing: Min guard;Sitting   Lower Body Bathing: Maximal assistance;Sit to/from stand   Upper Body Dressing : Min guard;Sitting   Lower Body Dressing: Maximal assistance;Sit to/from stand   Toilet Transfer: Moderate assistance;Stand-pivot;Rolling walker (2 wheels)   Toileting- Clothing Manipulation and Hygiene: Minimal assistance;Sitting/lateral lean  Functional mobility during ADLs: Moderate assistance General ADL Comments: increased assist needed for LB tasks, generally weak, poor activity toleracne, new O2 requirement     Vision Baseline  Vision/History: 1 Wears glasses Ability to See in Adequate Light: 0 Adequate Patient Visual Report: No change from baseline Vision Assessment?: No apparent visual deficits     Perception     Praxis      Pertinent Vitals/Pain Pain Assessment Pain Assessment: No/denies pain     Hand Dominance Right   Extremity/Trunk Assessment Upper Extremity Assessment Upper Extremity Assessment: Generalized weakness   Lower Extremity Assessment Lower Extremity Assessment: Defer to PT evaluation   Cervical / Trunk Assessment Cervical / Trunk Assessment: Kyphotic   Communication Communication Communication: HOH   Cognition Arousal/Alertness: Awake/alert Behavior During Therapy: Flat affect Overall Cognitive Status: Impaired/Different from baseline Area of Impairment: Attention, Following commands, Problem solving                   Current Attention Level: Sustained   Following Commands: Follows one step commands with increased time     Problem Solving: Slow processing, Requires verbal cues General Comments: incr time and cues required for all tasks     General Comments  VSS on 3L O2, sister present    Exercises     Shoulder Instructions      Home Living Family/patient expects to be discharged to:: Private residence Living Arrangements: Other (Comment) Available Help at Discharge: Personal care attendant Type of Home: House Home Access: Ramped entrance     Home Layout: One level     Bathroom Shower/Tub: Occupational psychologist: Handicapped height Bathroom Accessibility: Yes How Accessible: Accessible via walker Home Equipment: Advice worker (2 wheels);Transport chair;BSC/3in1;Grab bars - tub/shower;Rollator (4 wheels)   Additional Comments: lives alone with PCAs 5 days/wk for 8 hrs, Sat/Sun for 4 hours - assist from sister in law outside of those times if needed      Prior Functioning/Environment Prior Level of Function : Needs  assist             Mobility Comments: ambulates with RW and assist from aide ADLs Comments: Aide assists with all ADLs as needed, pt reports she tries to be as much as she can        OT Problem List: Decreased strength;Decreased range of motion;Decreased activity tolerance;Impaired balance (sitting and/or standing);Decreased safety awareness;Decreased knowledge of use of DME or AE;Decreased knowledge of precautions;Cardiopulmonary status limiting activity      OT Treatment/Interventions: Self-care/ADL training;Therapeutic exercise;DME and/or AE instruction;Therapeutic activities;Patient/family education;Balance training    OT Goals(Current goals can be found in the care plan section) Acute Rehab OT Goals Patient Stated Goal: home OT Goal Formulation: With patient Time For Goal Achievement: 01/20/22 Potential to Achieve Goals: Good ADL Goals Pt Will Perform Grooming: with set-up;sitting Pt Will Perform Lower Body Bathing: with min assist;sit to/from stand Pt Will Perform Lower Body Dressing: with min assist;sit to/from stand Pt Will Transfer to Toilet: with min assist;ambulating Additional ADL Goal #1: pt will tolerate at least 3 minutes of OOB standing functional activity with min G  OT Frequency: Min 2X/week    Co-evaluation              AM-PAC OT "6 Clicks" Daily Activity     Outcome Measure Help from another person eating meals?: A Little Help from another person taking care of personal grooming?: A Little Help from another person toileting, which includes using toliet, bedpan, or urinal?:  A Lot Help from another person bathing (including washing, rinsing, drying)?: A Lot Help from another person to put on and taking off regular upper body clothing?: A Little Help from another person to put on and taking off regular lower body clothing?: A Lot 6 Click Score: 15   End of Session Equipment Utilized During Treatment: Rolling walker (2 wheels);Gait belt;Oxygen Nurse  Communication: Mobility status  Activity Tolerance: Patient tolerated treatment well Patient left: in chair;with call bell/phone within reach;with chair alarm set;with family/visitor present  OT Visit Diagnosis: Other abnormalities of gait and mobility (R26.89);Unsteadiness on feet (R26.81);Muscle weakness (generalized) (M62.81)                Time: 3462-1947 OT Time Calculation (min): 27 min Charges:  OT General Charges $OT Visit: 1 Visit OT Evaluation $OT Eval Moderate Complexity: 1 Mod OT Treatments $Therapeutic Activity: 8-22 mins   Kitai Purdom A Klair Leising 01/06/2022, 12:27 PM

## 2022-01-06 NOTE — Assessment & Plan Note (Addendum)
Uncontrolled with hyperglycemia due to steroids. Hemoglobin A1c 6.8 roughly 2 weeks prior to admission. Not on any insulin at home. -Elevated CBGs in light secondary to steroids. -Steroids being tapered.  -Patient maintained on Semglee 10 units daily, NovoLog 5 units 3 times daily with meals as well as SSI. -On day of discharge patient noted to have a CBG of 54 however remained asymptomatic and blood glucose responded to orange juice. -Patient was discharged home back on home regimen of diabetic medications with 3 more days of oral steroids. -Outpatient follow-up with PCP. -Continue Semglee 10 units daily, SSI, NovoLog 5 units 3 times daily with meals, SSI.

## 2022-01-06 NOTE — Assessment & Plan Note (Deleted)
  #)   Essential Hypertension: documented h/o such, with outpatient antihypertensive regimen including  trandolapril, HCTZ, Norvasc, hydralazine, Toprol-XL, Lasix.  SBP's in the ED today: Normotensive.  However, in the setting of presenting severe sepsis, will hold home antihypertensive medications for now.  Additionally, in the setting of AKI and suspicion for mild dehydration, will hold home diuretic medications as well as ACE inhibitor for now.  Plan: Close monitoring of subsequent BP via routine VS. hold home antihypertensive medications, as above for now.

## 2022-01-06 NOTE — Progress Notes (Signed)
Echocardiogram 2D Echocardiogram has been performed.  Renee [name] 01/06/2022, 4:02 PM

## 2022-01-06 NOTE — Assessment & Plan Note (Addendum)
-  BP noted to be elevated patient started back on half home dose of Norvasc 5 mg daily as well as hydralazine 25 mg 3 times daily.   -Norvasc increased to 10 mg daily for better blood pressure control.   -Patient placed on Lasix 20 mg daily on home regimen of HCTZ resumed as well as Toprol-XL. -Patient's hydralazine was decreased to 25 mg 3 times daily. -Blood pressure remained adequately controlled during the hospitalization. -Outpatient follow-up with PCP.

## 2022-01-06 NOTE — Assessment & Plan Note (Deleted)
 #)   Acute hypoxic respiratory distress: in the context of acute respiratory symptoms and no known baseline supplemental O2 requirements, presenting O2 sat noted to be 89% on room air, subsequently increasing into the range of 93 to 95% on 2 L nasal cannula, thereby meeting criteria for acute hypoxic respiratory distress as opposed to acute hypoxic respiratory failure at this time. Appears to be on basis of severe sepsis due to community-acquired pneumonia as well as resultant acute asthma exacerbation, as further detailed above. Presenting CXR shows left lower lobe infiltrate consistent with pneumonia, in the absence of edema or pneumothorax.  While the patient has a documented history of chronic diastolic heart failure, and there is some elevation of her BNP, clinically, presentation appears less suggestive of acute on chronic diastolic heart failure.  However, will be gentle with provision of IV fluids as completed and management of severe sepsis, and monitor closely for ensuing evidence of acute volume overload.  We will also repeat echocardiogram in the morning given the most recent echocardiogram occurred approximately 20 months ago.  In terms of other considered etiologies, ACS appears less likely at this time in the absence of any recent CP and in the context of negative troponin and presenting EKG showing no e/o acute ischemic process.Clinically, presentation is less suggestive of acute PE at this time. COVID-19/Influenza PCR are negative.    Plan: further evaluation/management of presenting severe sepsis due to community-acquired pneumonia as well as acute asthma exacerbation, as above. Monitor continuous pulse ox with prn supplemental O2 to maintain O2 sats greater than or equal to 92%. monitor on telemetry. CMP/CBC in the AM.  Repeat serum Mg and Phos levels. Check blood gas. Flutter valve, incentive spirometry.  Solumedrol, as above.  Schedule DuoNebs.  As needed albuterol.  Add on procalcitonin  level.  Echo in the morning.

## 2022-01-06 NOTE — Hospital Course (Signed)
PMH of type II DM, HTN, chronic HFpEF, OSA, obesity.  Presents with complaints of shortness of breath and cough found to have pneumonia as well as possible mild exacerbation of asthma.

## 2022-01-06 NOTE — Assessment & Plan Note (Deleted)
? ? ? ?#)   Allergic Rhinitis: documented h/o such, on scheduled intranasal Flonase as outpatient.  ? ? ?Plan: cont home Flonase.  ? ? ?

## 2022-01-06 NOTE — Assessment & Plan Note (Deleted)
  #)   Generalized anxiety disorder: Documented history of such: On Zoloft as well as prn Xanax as an outpatient.  Plan: Continue home as needed Xanax for now.

## 2022-01-06 NOTE — Assessment & Plan Note (Deleted)
 #)   Severe sepsis due to community-acquired pneumonia: Diagnosis on the basis of 2 to 3 days of fever, chills, new onset cough, with presenting chest x-ray showing evidence of left lower lobe airspace opacity concerning for pneumonia.  SIRS criteria met via objective fever, tachypnea. Lactic acid level: Initially elevated at 2.8, with repeat value trending down to 2.1. Of note, given the associated presence of suspected end organ damage in the form of concominant presenting lactic acidosis, acute kidney injury, and acute hypoxic respiratory distress, criteria are met for pt's sepsis to be considered severe in nature. However, in the absence of lactic acid level that is greater than or equal to 4.0, and in the absence of any associated hypotension refractory to IVF's, there are no indications for administration of a 30 mL/kg IVF bolus at this time.   Additional ED work-up/management notable for: Collection of blood cultures x2 followed by initiation of Rocephin and azithromycin, which will be continued for empiric coverage of community-acquired pneumonia.  No e/o additional infectious process at this time, including COVID-19/influenza PCR negative.  However, urinalysis result is currently pending at this time.    Plan: CBC w/ diff and CMP in AM.  Follow for results of blood cx's x 2. Abx: Continue Rocephin and azithromycin, as above.  Gentle continuous IV fluids, with repeat lactic acid ordered with morning labs.  Add on procalcitonin level.  Check strep pneumoniae urine antigen.  Flutter valve, incentive spirometry.  Follow-up result urinalysis.  Prn acetaminophen.

## 2022-01-06 NOTE — Assessment & Plan Note (Deleted)
  #)   Type 2 Diabetes Mellitus: documented history of such. Home insulin regimen: None. Home oral hypoglycemic agents: Actos, glipizide.  She is also on Januvia as an outpatient.  Presenting blood sugar: 351, without any evidence of anion gap metabolic acidosis.  Suspect hyperglycemic exacerbation as consequence of physiologic stress stemming from severe sepsis due to community-acquired pneumonia as well as side effects of systemic corticosteroids after receiving high-dose Solu-Medrol in the ED. Most recent A1c noted to be 6.8% on 12/25/2021.  Plan: accuchecks QAC and HS with moderate dose SSI. hold home oral hypoglycemic agents during this hospitalization.  Gentle IV fluids, as above.

## 2022-01-06 NOTE — Assessment & Plan Note (Addendum)
Moderate pericardial effusion. Mildly volume overloaded early on during the hospitalization.  Chest x-ray showed evidence of vascular congestion. Echocardiogram shows preserved EF. Also shows pericardial effusion moderate without any tamponade. -Patient received IV Lasix and was subsequently transitioned to Lasix 20 mg daily.  -Will increase home regimen Lasix from 10 mg to 20 mg daily on discharge. -Patient also maintained on home regimen beta-blocker and HCTZ, and hydralazine. -Outpatient follow-up with PCP.

## 2022-01-06 NOTE — Assessment & Plan Note (Deleted)
 #)   Acute asthma exacerbation: In the setting of a documented history of mild intermittent asthma, presentation appears to be associated with acute exacerbation thereof in the setting of 2 to 3 days of shortness of breath, cough, wheezing, and new supplemental oxygen requirement, as further detailed below.  Suspect that this is on the basis of presenting severe sepsis due to community-acquired pneumonia, as above.  She has received Cymetra 125 mg IV x1 as well as duo nebulizer treatment in the ED.  Of note, presenting serum magnesium level 1.7  Plan: Scheduled duo nebulizer treatments, prn albuterol nebulizers.  Solumedrol 80 mg IV twice daily.  Check VBG to evaluate for evidence of respiratory fatigue.  Magnesium sulfate 2 g IV over 2 hours x 1 dose now.  Repeat serum magnesium level in the morning.  Check serum phosphorus level.  Monitor continuous pulse oximetry.  Further evaluation management of presenting severe sepsis due to community-acquired pneumonia, as above.

## 2022-01-06 NOTE — Plan of Care (Signed)
  Problem: Education: Goal: Knowledge of General Education information will improve Description: Including pain rating scale, medication(s)/side effects and non-pharmacologic comfort measures Outcome: Progressing   Problem: Activity: Goal: Risk for activity intolerance will decrease Outcome: Progressing   Problem: Nutrition: Goal: Adequate nutrition will be maintained Outcome: Progressing   

## 2022-01-06 NOTE — Assessment & Plan Note (Addendum)
With left hemiparesis. Exam unchanged. -Continue aspirin, Monitor.  PT OT recommended home with home health.

## 2022-01-06 NOTE — Assessment & Plan Note (Addendum)
Patient maintained on home regimen Zoloft and Xanax.

## 2022-01-06 NOTE — H&P (Addendum)
History and Physical    PLEASE NOTE THAT DRAGON DICTATION SOFTWARE WAS USED IN THE CONSTRUCTION OF THIS NOTE.   Renee [name] LSL:[id number] DOB: Oct 22, 1928 DOA: 01/05/2022  PCP: [name], MD  Patient coming from: home   I have personally briefly reviewed patient's old medical records in [place]  Chief Complaint: Generalized weakness  HPI: Renee [name] is a 88 y.o. female with medical history significant for type 2 diabetes mellitus, mild intermittent asthma, essential pretension, allergic rhinitis, chronic diastolic heart failure, who is admitted to [facility] on 01/05/2022 with severe sepsis due to community-acquired pneumonia after presenting from home to [facility] ED complaining of generalized weakness.   The patient conveys to 3 days of generalized weakness in the absence of any associated acute focal weakness, acute focal numbness, paresthesias, facial droop, slurred speech, expressive aphasia, acute change in vision, dysphagia, vertigo.  She notes that this has been associated with subjective fever/chills in the absence of full body rigors or generalized myalgias.  She also reports new onset cough over that timeframe, without any associated hemoptysis.  Reports associated mild shortness of breath, wheezing, in the absence of any associated orthopnea, PND, or worsening of peripheral edema.  No recent calf tenderness or new onset lower extremity erythema.  Not associate any chest pain, palpitations, diaphoresis, dizziness, presyncope, syncope, nausea, vomiting, diarrhea, abdominal pain, rash.  Has a documented history of mild intermittent asthma, without any known baseline supplemental oxygen requirements.  She also has a documented history of chronic diastolic heart failure, with most recent echocardiogram performed in October 2021 demonstrating LVEF 65 to 70%, no focal wall motion abnormalities, while showing evidence of grade 1 diastolic dysfunction, normal right  ventricular systolic function, mildly dilated left atrium, and no evidence of significant valvular pathology.  Per chart review, most recent prior serum creatinine data point appears to be 1.59 on 12/25/2021.    ED Course:  Vital signs in the ED were notable for the following: Temperature max 101.1; heart rate 71-87; blood pressure 108/47 - 155/65; respiratory rate 21-20, oxygen saturation initially noted to be 89% on room air, with ensuing improvement to 93-95% on 2 L nasal cannula.  Labs were notable for the following: CMP notable for the following: Sodium 139, bicarbonate 26, creatinine 1.82 with associated BUN/creatinine ratio 17.6, glucose 351, and liver enzymes within normal limits.  High-sensitivity troponin I x2 were both found to be 10.  BNP 243 compared to most recent prior value of 90 in January 2023.  CBC notable for white cell count 9900, hemoglobin 10.6 compared to 11.5 on 12/25/2021.  Initial lactate 2.8, with repeat value trending down to 2.1.  Urinalysis ordered, with result currently pending.  COVID-19/influenza PCR negative.  Blood cultures x2 were collected prior to initiation of IV antibiotics, as further detailed below.  Imaging and additional notable ED work-up: EKG shows sinus rhythm with left anterior fascicular block, heart rate 79, no evidence of T wave or ST changes, including no evidence of ST elevation.  Chest x-ray shows evidence of left basilar airspace opacity concerning for pneumonia without evidence of edema or pneumothorax.  While in the ED, the following were administered: Duo nebulizer, Solu-Medrol 125 mg IV x1, Rocephin, azithromycin.  Subsequently, the patient was admitted for further evaluation and management of severe sepsis due to community-acquired pneumonia complicated by acute asthma exacerbation and acute hypoxic respiratory distress after presenting with generalized weakness, with presenting labs also notable for acute kidney injury.  Review of  Systems: As per HPI otherwise 10 point review of systems negative.   Past Medical History:  Diagnosis Date   Angioedema    possibly from voltaren   Bronchopneumonia 12/11/2016   Degenerative disc disease    Diabetes mellitus    type II   Hyperlipidemia    Hypertension    LVH (left ventricular hypertrophy)    and atrial enlargement by echo in past with nl EF   Nasal pruritis    Osteoarthritis    Osteopenia    Renal insufficiency    Sleep apnea    Stroke ([place]) 05/2010   Small vessel sobcortical (in [place]) with Dr Leonie Man, residual L hemiparesis   Vitamin B 12 deficiency 04/08    Past Surgical History:  Procedure Laterality Date   ABDOMINAL HYSTERECTOMY     BSO-fibroids   APPENDECTOMY     BACK SURGERY     COLON SURGERY     due to punctured intestines   EYE SURGERY     cataract extraction   IR THORACENTESIS ASP PLEURAL SPACE W/IMG GUIDE  06/07/2020   KNEE SURGERY     arthroscope   PARS PLANA VITRECTOMY  07/31/2011   Procedure: PARS PLANA VITRECTOMY WITH 25 GAUGE;  Surgeon: [name], MD;  Location: [place];  Service: Ophthalmology;  Laterality: Right;  REMOVAL OF SILICONE OIL AND LASER RIGHT EYE   RETINAL DETACHMENT SURGERY  02/18/11   times 2   SPINE SURGERY  08/09   spinal decompression surgery    Social History:  reports that she has never smoked. She has never used smokeless tobacco. She reports that she does not drink alcohol and does not use drugs.   Allergies  Allergen Reactions   Tape Other (See Comments)    The patient's skin is VERY THIN- will TEAR EASILY!!   Diclofenac Sodium Swelling and Other (See Comments)    Angioedema   Pioglitazone Swelling and Other (See Comments)    Re-started in 2022    Family History  Problem Relation Age of Onset   COPD Brother    Cancer Sister        brain tumor with hemmorhage   Heart disease Sister        CAD    Family history reviewed and not pertinent    Prior to Admission medications   Medication  Sig Start Date End Date Taking? Authorizing Provider  acetaminophen (TYLENOL) 500 MG tablet Take 1,000 mg by mouth at bedtime.    [provider]  albuterol (VENTOLIN HFA) 108 (90 Base) MCG/ACT inhaler INHALE 2 PUFFS INTO THE LUNGS EVERY 4 HOURS AS NEEDED FOR WHEEZING/SHORTNESS OF BREATH Patient taking differently: Inhale 2 puffs into the lungs every 4 (four) hours as needed for shortness of breath or wheezing. 10/21/20   Tower, Wynelle Fanny, MD  ALPRAZolam [name]) 0.5 MG tablet Take 1 tablet (0.5 mg total) by mouth 2 (two) times daily. TAKE 1/2 TO 1 TABLET BY MOUTH TWICE A DAY AS NEEDED FOR ANXIETY Strength: 0.5 mg 09/01/21   [name] U, DO  amLODipine (NORVASC) 10 MG tablet TAKE 1 TABLET BY MOUTH EVERY DAY 11/14/21   [name], MD  aspirin EC 325 MG tablet Take 325 mg by mouth in the morning.    [provider]  Blood Glucose Monitoring Suppl (ONE TOUCH ULTRA 2) w/Device KIT Check blood sugar once daily and as directed. Dx E11.9 08/27/18   Tower, Wynelle Fanny, MD  calcium-vitamin D Darron Doom  WITH D) 500-200 MG-UNIT per tablet Take 1 tablet by mouth daily.    [provider]  cephALEXin (KEFLEX) 500 MG capsule Take 500 mg by mouth daily. 11/11/21   [provider]  Cholecalciferol (VITAMIN D-3) 25 MCG (1000 UT) CAPS Take 1,000 Units by mouth in the morning.    [provider]  Continuous Blood Gluc Receiver (FREESTYLE LIBRE 2 READER) DEVI 1 each by Does not apply route as directed. 10/27/21   [name], MD  Continuous Blood Gluc Sensor (FREESTYLE LIBRE 2 SENSOR) MISC 1 each by Does not apply route as directed. 10/27/21   [name], MD  Cranberry 500 MG TABS Take 500 mg by mouth in the morning and at bedtime.    [provider]  docusate sodium (COLACE) 100 MG capsule Take 200 mg by mouth at bedtime.    [provider]  estradiol (ESTRACE) 0.1 MG/GM vaginal cream PLEASE SEE ATTACHED FOR DETAILED DIRECTIONS 04/25/21   [provider]  FLONASE 50 MCG/ACT nasal spray Place 2 sprays into both nostrils See admin instructions. Instill 2 sprays into each nostril at bedtime when not using Nasacort    [provider]  furosemide (LASIX) 20 MG tablet TAKE 1/2 TABLET BY MOUTH EVERY DAY 12/11/21   Chesley Mires, MD  glipiZIDE (GLUCOTROL XL) 5 MG 24 hr tablet TAKE 1 TABLET BY MOUTH EVERY DAY WITH BREAKFAST 12/11/21   [name], MD  glucose blood (ONETOUCH ULTRA) test strip CHECK BLOOD SUGAR ONCE DAILY AND AS DIRECTED. 10/18/21   [name], MD  hydrALAZINE (APRESOLINE) 100 MG tablet Take 1 tablet (100 mg total) by mouth 3 (three) times daily. 09/28/20   [name], MD  hydrochlorothiazide (MICROZIDE) 12.5 MG capsule TAKE 1 CAPSULE BY MOUTH EVERY DAY 12/11/21   [name], MD  JANUVIA 100 MG tablet TAKE 1 TABLET BY MOUTH EVERY DAY 11/20/21   [name], MD  Lancets [facility] DELICA PLUS [id number]) MISC CHECK BLOOD SUGAR ONCE DAILY AND AS DIRECTED 08/01/21   [name], MD  metoprolol succinate (TOPROL-XL) 25 MG 24 hr tablet Take 12.5 mg by mouth 2 (two) times daily. 09/16/21   [provider]  mirtazapine (REMERON) 15 MG tablet Take 1 tablet (15 mg total) by mouth at bedtime. 05/26/21   [name], MD  Multiple Vitamin (MULTIVITAMIN) capsule Take 1 capsule by mouth daily.    [provider]  Multiple Vitamins-Minerals (PRESERVISION AREDS 2+MULTI VIT) CAPS Take 1 capsule by mouth in the morning and at bedtime.    [provider]  Olopatadine HCl (PATADAY OP) Place 1 drop into both eyes 2 (two) times daily as needed (for itching or redness).    [provider]  pioglitazone (ACTOS) 30 MG tablet Take 1 tablet (30 mg total) by mouth daily. 09/21/21   [name], MD  sertraline (ZOLOFT) 50 MG tablet Take 1 tablet (50 mg total) by mouth daily. 12/11/21   [name], MD  solifenacin (VESICARE) 10 MG tablet Take 1 tablet (10 mg total) by mouth  daily. 12/25/21   [name], MD  trandolapril (MAVIK) 4 MG tablet TAKE 1 TABLET DAILY 10/30/21   [name], MD  triamcinolone (NASACORT) 55 MCG/ACT AERO nasal inhaler Place 2 sprays into the nose daily. Patient taking differently: Place 2 sprays into the nose See admin instructions. Instill 2 sprays into each nostril at bedtime when not using Flonase 03/28/20  [name], MD  vitamin B-12 (CYANOCOBALAMIN) 1000 MCG tablet Take 1,000 mcg by mouth daily.     [provider]     Objective    Physical Exam: Vitals:   01/05/22 2145 01/05/22 2200 01/05/22 2212 01/05/22 2239  BP: (!) 105/39 (!) 123/45  (!) 128/54  Pulse: 78 83  76  Resp: (!) 27 (!) 22    Temp:   (!) 101.1 F (38.4 C) 99.7 F (37.6 C)  TempSrc:    Oral  SpO2: 93% 93%  95%  Weight:    81 kg    General: appears to be stated age; alert, oriented; mildly increased work of breathing noted Skin: warm, dry, no rash Head:  AT/[place] Mouth:  Oral mucosa membranes appear moist, normal dentition Neck: supple; trachea midline Heart:  RRR; did not appreciate any M/R/G Lungs: Mild bilateral expiratory wheezes noted, but otherwise CTAB, did not appreciate any rales, or rhonchi Abdomen: + BS; soft, ND, NT Vascular: 2+ pedal pulses b/l; 2+ radial pulses b/l Extremities: no peripheral edema, no muscle wasting Neuro: strength and sensation intact in upper and lower extremities b/l    Labs on Admission: I have personally reviewed following labs and imaging studies  CBC: Recent Labs  Lab 01/05/22 1854 01/06/22 0223  WBC 9.9 7.7  NEUTROABS 8.8* 7.1  HGB 10.6* 9.8*  HCT 34.1* 30.6*  MCV 90.2 89.2  PLT 135* 700*   Basic Metabolic Panel: Recent Labs  Lab 01/05/22 1854 01/05/22 2031 01/06/22 0223  NA 139  --  140  K 4.3  --  4.1  CL 102  --  105  CO2 26  --  25  GLUCOSE 351*  --  333*  BUN 32*  --  36*  CREATININE 1.82*  --  2.12*  CALCIUM 8.8*  --  8.6*  MG  --  1.7 1.8  PHOS  --   --   3.4   GFR: Estimated Creatinine Clearance: 17.8 mL/min (A) (by C-G formula based on SCr of 2.12 mg/dL (H)). Liver Function Tests: Recent Labs  Lab 01/05/22 1854 01/06/22 0223  AST 23 21  ALT 17 17  ALKPHOS 72 63  BILITOT 0.6 0.5  PROT 5.8* 5.3*  ALBUMIN 3.3* 2.9*   No results for input(s): LIPASE, AMYLASE in the last 168 hours. No results for input(s): AMMONIA in the last 168 hours. Coagulation Profile: No results for input(s): INR, PROTIME in the last 168 hours. Cardiac Enzymes: No results for input(s): CKTOTAL, CKMB, CKMBINDEX, TROPONINI in the last 168 hours. BNP (last 3 results) No results for input(s): PROBNP in the last 8760 hours. HbA1C: No results for input(s): HGBA1C in the last 72 hours. CBG: Recent Labs  Lab 01/05/22 2253 01/06/22 0126  GLUCAP 411* 355*   Lipid Profile: No results for input(s): CHOL, HDL, LDLCALC, TRIG, CHOLHDL, LDLDIRECT in the last 72 hours. Thyroid Function Tests: No results for input(s): TSH, T4TOTAL, FREET4, T3FREE, THYROIDAB in the last 72 hours. Anemia Panel: No results for input(s): VITAMINB12, FOLATE, FERRITIN, TIBC, IRON, RETICCTPCT in the last 72 hours. Urine analysis:    Component Value Date/Time   COLORURINE YELLOW 08/29/2021 [place] 08/29/2021 1456   LABSPEC 1.025 08/29/2021 1456   PHURINE 6.0 08/29/2021 1456   GLUCOSEU NEGATIVE 08/29/2021 1456   HGBUR NEGATIVE 08/29/2021 1456   HGBUR negative 03/27/2010 [place] 08/29/2021 1456   BILIRUBINUR Negative 08/31/2020 [facility] 08/29/2021 1456   PROTEINUR NEGATIVE  08/29/2021 1456   UROBILINOGEN 2.0 (H) 02/17/2021 1229   NITRITE POSITIVE (A) 08/29/2021 1456   LEUKOCYTESUR NEGATIVE 08/29/2021 1456    Radiological Exams on Admission: DG Chest Portable 1 View  Result Date: 01/05/2022 CLINICAL DATA:  Cough. EXAM: PORTABLE CHEST 1 VIEW COMPARISON:  Chest x-ray 08/29/2021. FINDINGS: There is some left basilar opacities  silhouetting the hemidiaphragm. Questionable small left pleural effusion. Right lung is clear. The heart is enlarged, unchanged. No pneumothorax or acute fracture. IMPRESSION: 1. Left basilar atelectasis/airspace disease with questionable small left pleural effusion. 2. Stable cardiomegaly. Electronically Signed   By: Ronney Asters M.D.   On: 01/05/2022 19:19     EKG: Independently reviewed, with result as described above.    Assessment/Plan   Principal Problem:   Severe sepsis (HCC) Active Problems:   Type 2 diabetes, controlled, with retinopathy ([place])   Essential hypertension   Anxiety and depression   CAP (community acquired pneumonia)   Acute renal failure superimposed on stage 3b chronic kidney disease (HCC)   Generalized weakness   Acute respiratory failure with hypoxia (HCC)   Asthma, chronic, unspecified asthma severity, with acute exacerbation   Allergic rhinitis   Chronic diastolic CHF (congestive heart failure) ([place])     #) Severe sepsis due to community-acquired pneumonia: Diagnosis on the basis of 2 to 3 days of fever, chills, new onset cough, with presenting chest x-ray showing evidence of left lower lobe airspace opacity concerning for pneumonia.  SIRS criteria met via objective fever, tachypnea. Lactic acid level: Initially elevated at 2.8, with repeat value trending down to 2.1. Of note, given the associated presence of suspected end organ damage in the form of concominant presenting lactic acidosis, acute kidney injury, and acute hypoxic respiratory distress, criteria are met for pt's sepsis to be considered severe in nature. However, in the absence of lactic acid level that is greater than or equal to 4.0, and in the absence of any associated hypotension refractory to IVF's, there are no indications for administration of a 30 mL/kg IVF bolus at this time.   Additional ED work-up/management notable for: Collection of blood cultures x2 followed by initiation of Rocephin  and azithromycin, which will be continued for empiric coverage of community-acquired pneumonia.  No e/o additional infectious process at this time, including COVID-19/influenza PCR negative.  However, urinalysis result is currently pending at this time.    Plan: CBC w/ diff and CMP in AM.  Follow for results of blood cx's x 2. Abx: Continue Rocephin and azithromycin, as above.  Gentle continuous IV fluids, with repeat lactic acid ordered with morning labs.  Add on procalcitonin level.  Check strep pneumoniae urine antigen.  Flutter valve, incentive spirometry.  Follow-up result urinalysis.  Prn acetaminophen.        #) Acute asthma exacerbation: In the setting of a documented history of mild intermittent asthma, presentation appears to be associated with acute exacerbation thereof in the setting of 2 to 3 days of shortness of breath, cough, wheezing, and new supplemental oxygen requirement, as further detailed below.  Suspect that this is on the basis of presenting severe sepsis due to community-acquired pneumonia, as above.  She has received Cymetra 125 mg IV x1 as well as duo nebulizer treatment in the ED.  Of note, presenting serum magnesium level 1.7  Plan: Scheduled duo nebulizer treatments, prn albuterol nebulizers.  Solumedrol 80 mg IV twice daily.  Check VBG to evaluate for evidence of respiratory fatigue.  Magnesium sulfate 2 g IV  over 2 hours x 1 dose now.  Repeat serum magnesium level in the morning.  Check serum phosphorus level.  Monitor continuous pulse oximetry.  Further evaluation management of presenting severe sepsis due to community-acquired pneumonia, as above.         #) Acute hypoxic respiratory distress: in the context of acute respiratory symptoms and no known baseline supplemental O2 requirements, presenting O2 sat noted to be 89% on room air, subsequently increasing into the range of 93 to 95% on 2 L nasal cannula, thereby meeting criteria for acute hypoxic  respiratory distress as opposed to acute hypoxic respiratory failure at this time. Appears to be on basis of severe sepsis due to community-acquired pneumonia as well as resultant acute asthma exacerbation, as further detailed above. Presenting CXR shows left lower lobe infiltrate consistent with pneumonia, in the absence of edema or pneumothorax.  While the patient has a documented history of chronic diastolic heart failure, and there is some elevation of her BNP, clinically, presentation appears less suggestive of acute on chronic diastolic heart failure.  However, will be gentle with provision of IV fluids as completed and management of severe sepsis, and monitor closely for ensuing evidence of acute volume overload.  We will also repeat echocardiogram in the morning given the most recent echocardiogram occurred approximately 20 months ago.  In terms of other considered etiologies, ACS appears less likely at this time in the absence of any recent CP and in the context of negative troponin and presenting EKG showing no e/o acute ischemic process.Clinically, presentation is less suggestive of acute PE at this time. COVID-19/Influenza PCR are negative.    Plan: further evaluation/management of presenting severe sepsis due to community-acquired pneumonia as well as acute asthma exacerbation, as above. Monitor continuous pulse ox with prn supplemental O2 to maintain O2 sats greater than or equal to 92%. monitor on telemetry. CMP/CBC in the AM.  Repeat serum Mg and Phos levels. Check blood gas. Flutter valve, incentive spirometry.  Solumedrol, as above.  Schedule DuoNebs.  As needed albuterol.  Add on procalcitonin level.  Echo in the morning.           #) Generalized weakness: 2 to 3-day duration of generalized weakness, in the absence of any evidence of acute focal neurologic deficits, including no evidence of acute focal weakness.  Suspect contribution from physiologic stress stemming from presenting  severe sepsis due to community-acquired pneumonia.  No e/o additional infectious process at this time, including negative COVID-19/influenza PCR, although resulted urinalysis is pending at this time..   In terms of other considered etiologies, acute ischemic CVA is felt to be less likely at this time in absence of any associated acute focal neuro deficits. Will further eval for any additional contributions from endocrine/metabolic sources, as detailed below.    Plan: work-up and management of presenting severe sepsis due to community-acquired pneumonia, as described above. PT/OT consults ordered for the AM. Fall precautions. CMP/CBC in the AM. Check TSH, MMA.  Follow-up result urinalysis.  Check CPK level, ionized calcium level.        #) Acute Kidney Injury on ckd 3b: Presenting serum creatinine noted to be 1.82 compared to 1.59 on 12/25/2021.  Suspect some contribution from prerenal etiology as a consequence of diminished renal perfusion as a consequence of diminished oxygen delivery capacity stemming from presenting acute hypoxic respiratory distress, as above, as well as mild intravascular depletion as consequence of presenting severe sepsis due to community-acquired pneumonia.   Plan: monitor strict  I's & O's and daily weights. Attempt to avoid nephrotoxic agents. Refrain from NSAIDs. Repeat CMP in the morning.  Check urinalysis with microscopy.  Add-on random urine sodium and random urine creatinine.  Check CPK level.  Gentle IV fluids in the form of lactated Ringer's at 50 cc/h x 8 hours, closely monitoring for evidence of ensuing volume overload given history of chronic diastolic heart failure.  Hold home Vesicare.  Hold home ACE inhibitor.          #) Type 2 Diabetes Mellitus: documented history of such. Home insulin regimen: None. Home oral hypoglycemic agents: Actos, glipizide.  She is also on Januvia as an outpatient.  Presenting blood sugar: 351, without any evidence of anion gap  metabolic acidosis.  Suspect hyperglycemic exacerbation as consequence of physiologic stress stemming from severe sepsis due to community-acquired pneumonia as well as side effects of systemic corticosteroids after receiving high-dose Solu-Medrol in the ED. Most recent A1c noted to be 6.8% on 12/25/2021.  Plan: accuchecks QAC and HS with moderate dose SSI. hold home oral hypoglycemic agents during this hospitalization.  Gentle IV fluids, as above.          #) Essential Hypertension: documented h/o such, with outpatient antihypertensive regimen including  trandolapril, HCTZ, Norvasc, hydralazine, Toprol-XL, Lasix.  SBP's in the ED today: Normotensive.  However, in the setting of presenting severe sepsis, will hold home antihypertensive medications for now.  Additionally, in the setting of AKI and suspicion for mild dehydration, will hold home diuretic medications as well as ACE inhibitor for now.  Plan: Close monitoring of subsequent BP via routine VS. hold home antihypertensive medications, as above for now.         #) Generalized anxiety disorder: Documented history of such: On Zoloft as well as prn Xanax as an outpatient.  Plan: Continue home as needed Xanax for now.         #) Allergic Rhinitis: documented h/o such, on scheduled intranasal Flonase as outpatient.    Plan: cont home Flonase.            #) Chronic diastolic heart failure: documented history of such, with most recent echocardiogram performed in October 2021 notable for LV EF 65 to 70% as well as grade 1 diastolic dysfunction.  Clinically and radiographically, presentation appears less suggestive of acutely decompensated heart failure , although BNP is slightly elevated in the interval relative to most recent prior value, as further quantified above.  We will closely monitor for evidence of ensuing volume overload, and pursue echocardiogram in the morning, as further detailed above in the context of  concomitant acute hypoxic respiratory distress.  Diuretic regimen at home consists of Lasix 10 mg p.o. daily.   Plan: monitor strict I's & O's and daily weights. Repeat BMP in AM.  Recheck serum mag level in the morning.  Hold home Lasix for now.  Echocardiogram in the morning.      DVT prophylaxis: SCD's   Code Status: DNR (per pt, confirmed/consistent with code status during most recent prior for hospitalizations) Family Communication: none Disposition Plan: Per Rounding Team Consults called: none;  Admission status: Inpatient   PLEASE NOTE THAT DRAGON DICTATION SOFTWARE WAS USED IN THE CONSTRUCTION OF THIS NOTE.   [place] DO [facility]  From [place]   01/06/2022, 3:47 AM

## 2022-01-06 NOTE — Assessment & Plan Note (Addendum)
Baseline serum creatinine around 1.3.  GFR more than 30 prior to admission. Serum creatinine on admission 1.8, worsened to 2.2 with improvement with IV hydration. -Patient received IV Lasix and subsequently transition to oral Lasix with good urine output. -Creatinine stabilized at 1.52. -Patient was discharged home on increased dose of Lasix will need close outpatient follow-up with PCP for repeat labs 1 week postdischarge. -Follow.

## 2022-01-06 NOTE — Assessment & Plan Note (Addendum)
Acute hypoxic respiratory failure. Mild asthma exacerbation/Pneumonia With fever, tachypnea and elevated lactic acid meets SIRS criteria.  Chest x-ray showing evidence of pneumonia with elevated procalcitonin meeting criteria for infection on admission. O2 saturation 88% on room air with tachypnea on admission.  Patient improved clinically such that by day of discharge patient with sats of 97% on room air when sitting up.  COVID-19 PCR negative, influenza A and B PCR negative. Blood cultures negative. -Patient with improvement with wheezing however noted to have some diffuse rhonchorous breath sounds.   -Patient was on IV steroids and transition to oral prednisone taper which she tolerated. -Status post 7 days IV antibiotics.   -Patient placed on  scheduled DuoNebs, Pulmicort, Claritin, PPI, Flonase, Mucinex, 3% saline nebs, oral Lasix, flutter valve, incentive spirometry, chest vest. -Due to continuous rhonchorous breath sounds/congestion caretaker/family requested pulmonary input. -PCCM assessed patient and recommend continuing current regimen with mobilization.  No further work-up needed. -Patient ordered nocturnal oxygenation and will need evaluation for sleep in the outpatient setting. -Outpatient follow-up with PCP and pulmonary.

## 2022-01-07 ENCOUNTER — Other Ambulatory Visit: Payer: Self-pay | Admitting: Family Medicine

## 2022-01-07 DIAGNOSIS — A419 Sepsis, unspecified organism: Secondary | ICD-10-CM | POA: Diagnosis not present

## 2022-01-07 DIAGNOSIS — J189 Pneumonia, unspecified organism: Secondary | ICD-10-CM | POA: Diagnosis not present

## 2022-01-07 LAB — CBC
HCT: 29.8 % — ABNORMAL LOW (ref 36.0–46.0)
Hemoglobin: 9.1 g/dL — ABNORMAL LOW (ref 12.0–15.0)
MCH: 27.8 pg (ref 26.0–34.0)
MCHC: 30.5 g/dL (ref 30.0–36.0)
MCV: 91.1 fL (ref 80.0–100.0)
Platelets: 128 10*3/uL — ABNORMAL LOW (ref 150–400)
RBC: 3.27 MIL/uL — ABNORMAL LOW (ref 3.87–5.11)
RDW: 15.3 % (ref 11.5–15.5)
WBC: 7.5 10*3/uL (ref 4.0–10.5)
nRBC: 0 % (ref 0.0–0.2)

## 2022-01-07 LAB — STREP PNEUMONIAE URINARY ANTIGEN: Strep Pneumo Urinary Antigen: NEGATIVE

## 2022-01-07 LAB — BASIC METABOLIC PANEL
Anion gap: 7 (ref 5–15)
BUN: 40 mg/dL — ABNORMAL HIGH (ref 8–23)
CO2: 26 mmol/L (ref 22–32)
Calcium: 8.5 mg/dL — ABNORMAL LOW (ref 8.9–10.3)
Chloride: 108 mmol/L (ref 98–111)
Creatinine, Ser: 1.78 mg/dL — ABNORMAL HIGH (ref 0.44–1.00)
GFR, Estimated: 27 mL/min — ABNORMAL LOW (ref >60)
Glucose, Bld: 173 mg/dL — ABNORMAL HIGH (ref 70–99)
Potassium: 4.4 mmol/L (ref 3.5–5.1)
Sodium: 141 mmol/L (ref 135–145)

## 2022-01-07 LAB — GLUCOSE, CAPILLARY
Glucose-Capillary: 96 mg/dL (ref 70–99)
Glucose-Capillary: 198 mg/dL — ABNORMAL HIGH (ref 70–99)
Glucose-Capillary: 329 mg/dL — ABNORMAL HIGH (ref 70–99)
Glucose-Capillary: 427 mg/dL — ABNORMAL HIGH (ref 70–99)

## 2022-01-07 LAB — URINALYSIS, ROUTINE W REFLEX MICROSCOPIC
Bilirubin Urine: NEGATIVE
Glucose, UA: NEGATIVE mg/dL
Hgb urine dipstick: NEGATIVE
Ketones, ur: NEGATIVE mg/dL
Leukocytes,Ua: NEGATIVE
Nitrite: NEGATIVE
Protein, ur: NEGATIVE mg/dL
Specific Gravity, Urine: 1.018 (ref 1.005–1.030)
pH: 5 (ref 5.0–8.0)

## 2022-01-07 LAB — SODIUM, URINE, RANDOM: Sodium, Ur: 139 mmol/L

## 2022-01-07 LAB — CREATININE, URINE, RANDOM: Creatinine, Urine: 111.19 mg/dL

## 2022-01-07 LAB — MAGNESIUM: Magnesium: 2.2 mg/dL (ref 1.7–2.4)

## 2022-01-07 LAB — GLUCOSE, RANDOM: Glucose, Bld: 448 mg/dL — ABNORMAL HIGH (ref 70–99)

## 2022-01-07 MED ORDER — IPRATROPIUM-ALBUTEROL 0.5-2.5 (3) MG/3ML IN SOLN
3.0000 mL | Freq: Four times a day (QID) | RESPIRATORY_TRACT | Status: DC
  Administered 2022-01-07: 3 mL via RESPIRATORY_TRACT
  Filled 2022-01-07: qty 3

## 2022-01-07 MED ORDER — INSULIN ASPART 100 UNIT/ML IJ SOLN: 5.0000 [IU] | Freq: Once | INTRAMUSCULAR | Status: DC

## 2022-01-07 MED ORDER — INSULIN ASPART 100 UNIT/ML IJ SOLN
3.0000 [IU] | Freq: Once | INTRAMUSCULAR | Status: AC
  Administered 2022-01-07: 3 [IU] via SUBCUTANEOUS

## 2022-01-07 MED ORDER — DM-GUAIFENESIN ER 30-600 MG PO TB12
1.0000 | ORAL_TABLET | Freq: Two times a day (BID) | ORAL | Status: DC
  Administered 2022-01-07 – 2022-01-16 (×19): 1 via ORAL
  Filled 2022-01-07 (×19): qty 1

## 2022-01-07 MED ORDER — ORAL CARE MOUTH RINSE
15.0000 mL | Freq: Two times a day (BID) | OROMUCOSAL | Status: DC
  Administered 2022-01-07 – 2022-01-15 (×17): 15 mL via OROMUCOSAL

## 2022-01-07 MED ORDER — IPRATROPIUM-ALBUTEROL 0.5-2.5 (3) MG/3ML IN SOLN
3.0000 mL | Freq: Three times a day (TID) | RESPIRATORY_TRACT | Status: DC
  Administered 2022-01-07 – 2022-01-08 (×2): 3 mL via RESPIRATORY_TRACT
  Filled 2022-01-07 (×2): qty 3

## 2022-01-07 NOTE — Progress Notes (Signed)
  Progress Note Patient: Renee [name] VOZ:[id number] DOB: Dec 23, 1928 DOA: 01/05/2022  DOS: the patient was seen and examined on 01/07/2022  Brief hospital course: PMH of type II DM, HTN, chronic HFpEF, OSA, obesity.  Presents with complaints of shortness of breath and cough found to have pneumonia as well as possible mild exacerbation of asthma. Assessment and Plan: * Sepsis due to pneumonia ([place]) Acute hypoxic respiratory failure. Mild asthma exacerbation. With fever, tachypnea and elevated lactic acid meets SIRS criteria.  Chest x-ray showing evidence of pneumonia with elevated procalcitonin minutes criteria for infection. O2 saturation 88% on room air with tachypnea on admission. Continue with IV antibiotics. Patient was started on aggressive regimen for steroids I will taper  aggressively as well.  Continue nebulizer therapy.  Follow-up on blood cultures.  Follow-up on urinalysis.  Acute renal failure superimposed on stage 3b chronic kidney disease (HCC) Baseline serum creatinine around 1.3.  GFR more than 30 prior to admission. On admission serum creatinine 1.8 continues to trend up right now.  Now trending down  Chronic diastolic CHF (congestive heart failure) (HCC) Volume status adequate. Currently monitoring and holding home Lasix.  Obesity (BMI 30-39.9) OSA Not on CPAP  Body mass index is 31.63 kg/m.  Placing the pt at higher risk of poor outcomes.  History of CVA (cerebrovascular accident) With left hemiparesis. Exam unchanged. Monitor.  PT OT recommends home with home health.  Anxiety and depression Continue home Xanax.  Essential hypertension Blood pressure stable.  Monitor.  Avoiding hypertensive medications.  Type 2 diabetes, controlled, with retinopathy ([place]) Hemoglobin A1c 6.8 2 weeks ago. Not on any insulin at home. Continue sliding scale insulin for now.  Subjective: No nausea no vomiting no fever no chills.  Continues to have cough continues to have  shortness of breath.  No chest pain.  No chest tightness.  No abdominal pain.  No diarrhea.  Feeling somewhat same as yesterday.  Physical Exam: Vitals:   01/06/22 2048 01/07/22 0624 01/07/22 0724 01/07/22 1428  BP: (!) 146/62 (!) 145/62 (!) 131/52 (!) 137/50  Pulse: 65 69 65 77  Resp: '18 20 20 18  '$ Temp: 98.2 F (36.8 C) (!) 97.5 F (36.4 C) 98 F (36.7 C) 98 F (36.7 C)  TempSrc: Oral Oral Oral   SpO2: 97% 96% 97% 95%  Weight:       General: Appear in moderate distress; no visible Abnormal Neck Mass Or lumps, Conjunctiva normal Cardiovascular: S1 and S2 Present, no Murmur, Respiratory: increased respiratory effort, Bilateral Air entry present and Occasional  Crackles, bilateral  wheezes Abdomen: Bowel Sound present, Non tender  Extremities: no Pedal edema Neurology: alert and oriented to time, place, and person Gait not checked due to patient safety concerns   Data Reviewed: I have Reviewed nursing notes, Vitals, and Lab results since pt's last encounter. Pertinent lab results CBC and BMP I have ordered test including CBC and BMP    Family Communication: None at bedside  Disposition: Status is: Inpatient Remains inpatient appropriate because: Still with hypoxia and respiratory distress requiring intervention  Author: Berle Mull, MD 01/07/2022 7:46 PM  Please look on [email or web address] to find out who is on call.

## 2022-01-07 NOTE — TOC Initial Note (Addendum)
Transition of Care [facility]) - Initial/Assessment Note    Patient Details  Name: Renee [name] MRN: [id number] Date of Birth: Aug 04, 1929  Transition of [facility]) CM/SW Contact:    Bartholomew Crews, RN Phone Number: [phone] 01/07/2022, 8:49 AM  Clinical Narrative:                  Spoke with patient on hospital room phone. Confirmed PCP and pharmacy as listed in [place]. She lives at home with family and caregiver support, and assist with any transportation needs. She verbalized no questions or concerns about prescribed medications. She is agreeable to [facility] PT/OT and stated that she has [place] sent to liaison to confirm continuation of services after transition home. Patient will need HH PT OT orders with Face to Face. TOC following for transition needs.   Update: Levada Dy from [facility] responded that patient is not currently active with them, but they can accept referral to provide services post discharge.   Expected Discharge Plan: [place] Barriers to Discharge: Continued Medical Work up   Patient Goals and CMS Choice Patient states their goals for this hospitalization and ongoing recovery are:: home with family and caregiver support CMS Medicare.gov Compare Post Acute Care list provided to:: Patient Choice offered to / list presented to : Patient  Expected Discharge Plan and Services Expected Discharge Plan: [place]   Discharge Planning Services: CM Consult Post Acute Care Choice: [place] arrangements for the past 2 months: Single Family Home                 DME Arranged: N/A DME Agency: NA       HH Arranged: PT, OT [place] Agency: [place] (now known as [facility]) Date HH Agency Contacted: 01/07/22 Time HH Agency Contacted: [phone] Representative spoke with at [place]: Levada Dy  Prior Living Arrangements/Services Living arrangements for the past 2 months: [place] with:: Self Patient language  and need for interpreter reviewed:: Yes Do you feel safe going back to the place where you live?: Yes      Need for Family Participation in Patient Care: Yes (Comment) Care giver support system in place?: Yes (comment) Current home services: DME Criminal Activity/Legal Involvement Pertinent to Current Situation/Hospitalization: No - Comment as needed  Activities of Daily Living Home Assistive Devices/Equipment: Walker (specify type), CBG Meter, Hearing aid ADL Screening (condition at time of admission) Patient's cognitive ability adequate to safely complete daily activities?: Yes Is the patient deaf or have difficulty hearing?: Yes Does the patient have difficulty seeing, even when wearing glasses/contacts?: No Does the patient have difficulty concentrating, remembering, or making decisions?: No Patient able to express need for assistance with ADLs?: Yes Does the patient have difficulty dressing or bathing?: Yes Independently performs ADLs?: No Communication: Independent Dressing (OT): Needs assistance Is this a change from baseline?: Pre-admission baseline Grooming: Needs assistance Is this a change from baseline?: Pre-admission baseline Feeding: Needs assistance Is this a change from baseline?: Pre-admission baseline Bathing: Needs assistance Is this a change from baseline?: Pre-admission baseline Toileting: Needs assistance Is this a change from baseline?: Pre-admission baseline In/Out Bed: Needs assistance Is this a change from baseline?: Pre-admission baseline Walks in Home: Needs assistance Is this a change from baseline?: Pre-admission baseline Does the patient have difficulty walking or climbing stairs?: Yes Weakness of Legs: Left Weakness of Arms/Hands: Left  Permission Sought/Granted  Emotional Assessment Appearance:: Appears stated age Attitude/Demeanor/Rapport: Engaged Affect (typically observed): Accepting Orientation: : Oriented to Self,  Oriented to Place, Oriented to  Time, Oriented to Situation Alcohol / Substance Use: Not Applicable Psych Involvement: No (comment)  Admission diagnosis:  Acute respiratory failure with hypoxia ([place]) [J96.01] Patient Active Problem List   Diagnosis Date Noted   Asthma, chronic, unspecified asthma severity, with acute exacerbation 01/06/2022   Allergic rhinitis 01/06/2022   Chronic diastolic CHF (congestive heart failure) ([place]) 01/06/2022   Obesity (BMI 30-39.9) 01/06/2022   Acute respiratory failure with hypoxia (HCC) 01/05/2022   Bradycardia 09/01/2021   UTI (urinary tract infection) 08/29/2021   Wheezing on expiration 08/29/2021   Sepsis due to pneumonia ([place]) 06/07/2020   Pleural effusion 05/13/2020   Frequent UTI 05/13/2020   Dizziness 02/12/2020   Normocytic anemia 02/12/2020   Fall involving sidewalk curb 11/29/2019   Contusion of back 11/27/2019   Aortic atherosclerosis ([place]) 07/13/2019   CKD (chronic kidney disease) stage 4, GFR 15-29 ml/min (HCC) 07/01/2019   External hemorrhoid 01/17/2018   History of colitis 01/17/2018   Generalized weakness 12/24/2016   Diabetic retinopathy ([place]) 12/11/2016   History of CVA (cerebrovascular accident) 12/11/2016   Weakness secondary to UTI 10/21/2015   Acute renal failure superimposed on stage 3b chronic kidney disease ([place]) 10/12/2015   CAP (community acquired pneumonia) 10/07/2015   Estrogen deficiency 08/30/2015   Mobility impaired 06/26/2011   History of retinal detachment 01/10/2011   Sleep apnea 11/28/2010   Anxiety and depression 08/25/2010   Hemiplegia, late effect of cerebrovascular disease ([place]) 07/05/2010   POSTHERPETIC NEURALGIA 11/09/2009   Renal insufficiency 06/29/2008   Chronic back pain 01/26/2008   EDEMA 01/26/2008   B12 deficiency 01/10/2007   Type 2 diabetes, controlled, with retinopathy ([place]) 11/27/2006   Essential hypertension 11/27/2006   FIBROCYSTIC BREAST DISEASE 11/27/2006   ROSACEA 11/27/2006    OSTEOARTHRITIS 11/27/2006   URINARY INCONTINENCE, MIXED 11/27/2006   PCP:  [name], MD Pharmacy:   [id number] [place] [id number]: [phone]: [phone] [facility] [place] [facility] [id number]: [phone]: [phone]    Social Determinants of Health (SDOH) Interventions    Readmission Risk Interventions    06/10/2020    4:24 PM  Readmission Risk Prevention Plan  PCP or Specialist appointment within 3-5 days of discharge Complete

## 2022-01-08 ENCOUNTER — Inpatient Hospital Stay (HOSPITAL_COMMUNITY): Payer: Medicare HMO

## 2022-01-08 DIAGNOSIS — J189 Pneumonia, unspecified organism: Secondary | ICD-10-CM | POA: Diagnosis not present

## 2022-01-08 DIAGNOSIS — A419 Sepsis, unspecified organism: Secondary | ICD-10-CM | POA: Diagnosis not present

## 2022-01-08 LAB — GLUCOSE, CAPILLARY
Glucose-Capillary: 143 mg/dL — ABNORMAL HIGH (ref 70–99)
Glucose-Capillary: 238 mg/dL — ABNORMAL HIGH (ref 70–99)
Glucose-Capillary: 243 mg/dL — ABNORMAL HIGH (ref 70–99)
Glucose-Capillary: 320 mg/dL — ABNORMAL HIGH (ref 70–99)
Glucose-Capillary: 337 mg/dL — ABNORMAL HIGH (ref 70–99)

## 2022-01-08 LAB — BASIC METABOLIC PANEL
Anion gap: 7 (ref 5–15)
BUN: 37 mg/dL — ABNORMAL HIGH (ref 8–23)
CO2: 26 mmol/L (ref 22–32)
Calcium: 8.2 mg/dL — ABNORMAL LOW (ref 8.9–10.3)
Chloride: 106 mmol/L (ref 98–111)
Creatinine, Ser: 1.6 mg/dL — ABNORMAL HIGH (ref 0.44–1.00)
GFR, Estimated: 30 mL/min — ABNORMAL LOW (ref >60)
Glucose, Bld: 188 mg/dL — ABNORMAL HIGH (ref 70–99)
Potassium: 4.1 mmol/L (ref 3.5–5.1)
Sodium: 139 mmol/L (ref 135–145)

## 2022-01-08 LAB — CBC
HCT: 28 % — ABNORMAL LOW (ref 36.0–46.0)
Hemoglobin: 8.7 g/dL — ABNORMAL LOW (ref 12.0–15.0)
MCH: 28.2 pg (ref 26.0–34.0)
MCHC: 31.1 g/dL (ref 30.0–36.0)
MCV: 90.9 fL (ref 80.0–100.0)
Platelets: 125 10*3/uL — ABNORMAL LOW (ref 150–400)
RBC: 3.08 MIL/uL — ABNORMAL LOW (ref 3.87–5.11)
RDW: 15.2 % (ref 11.5–15.5)
WBC: 5.2 10*3/uL (ref 4.0–10.5)
nRBC: 0 % (ref 0.0–0.2)

## 2022-01-08 LAB — CALCIUM, IONIZED: Calcium, Ionized, Serum: 4.8 mg/dL (ref 4.5–5.6)

## 2022-01-08 LAB — MAGNESIUM: Magnesium: 2.1 mg/dL (ref 1.7–2.4)

## 2022-01-08 MED ORDER — INSULIN ASPART 100 UNIT/ML IJ SOLN
0.0000 [IU] | Freq: Three times a day (TID) | INTRAMUSCULAR | Status: DC
  Administered 2022-01-08 (×2): 7 [IU] via SUBCUTANEOUS
  Administered 2022-01-09: 15 [IU] via SUBCUTANEOUS
  Administered 2022-01-09: 20 [IU] via SUBCUTANEOUS
  Administered 2022-01-10: 7 [IU] via SUBCUTANEOUS
  Administered 2022-01-10: 20 [IU] via SUBCUTANEOUS
  Administered 2022-01-10: 15 [IU] via SUBCUTANEOUS
  Administered 2022-01-11: 3 [IU] via SUBCUTANEOUS
  Administered 2022-01-11: 11 [IU] via SUBCUTANEOUS
  Administered 2022-01-11: 7 [IU] via SUBCUTANEOUS
  Administered 2022-01-12: 3 [IU] via SUBCUTANEOUS
  Administered 2022-01-12 (×2): 7 [IU] via SUBCUTANEOUS
  Administered 2022-01-13: 3 [IU] via SUBCUTANEOUS
  Administered 2022-01-13: 7 [IU] via SUBCUTANEOUS
  Administered 2022-01-13: 4 [IU] via SUBCUTANEOUS
  Administered 2022-01-14: 7 [IU] via SUBCUTANEOUS
  Administered 2022-01-14: 3 [IU] via SUBCUTANEOUS
  Administered 2022-01-14: 4 [IU] via SUBCUTANEOUS
  Administered 2022-01-15: 3 [IU] via SUBCUTANEOUS

## 2022-01-08 MED ORDER — INSULIN ASPART 100 UNIT/ML IJ SOLN
0.0000 [IU] | Freq: Every day | INTRAMUSCULAR | Status: DC
  Administered 2022-01-08: 4 [IU] via SUBCUTANEOUS
  Administered 2022-01-09 – 2022-01-12 (×3): 2 [IU] via SUBCUTANEOUS
  Administered 2022-01-14: 3 [IU] via SUBCUTANEOUS

## 2022-01-08 MED ORDER — FUROSEMIDE 10 MG/ML IJ SOLN
40.0000 mg | Freq: Once | INTRAMUSCULAR | Status: AC
  Administered 2022-01-08: 40 mg via INTRAVENOUS
  Filled 2022-01-08: qty 4

## 2022-01-08 MED ORDER — BENZONATATE 100 MG PO CAPS
100.0000 mg | ORAL_CAPSULE | Freq: Three times a day (TID) | ORAL | Status: DC
  Administered 2022-01-08 – 2022-01-16 (×24): 100 mg via ORAL
  Filled 2022-01-08 (×24): qty 1

## 2022-01-08 MED ORDER — MELATONIN 3 MG PO TABS
3.0000 mg | ORAL_TABLET | Freq: Every day | ORAL | Status: DC
  Administered 2022-01-08 – 2022-01-15 (×8): 3 mg via ORAL
  Filled 2022-01-08 (×8): qty 1

## 2022-01-08 MED ORDER — IPRATROPIUM-ALBUTEROL 0.5-2.5 (3) MG/3ML IN SOLN
3.0000 mL | Freq: Three times a day (TID) | RESPIRATORY_TRACT | Status: DC
  Administered 2022-01-08 – 2022-01-09 (×4): 3 mL via RESPIRATORY_TRACT
  Filled 2022-01-08 (×4): qty 3

## 2022-01-08 MED ORDER — PREDNISONE 20 MG PO TABS
40.0000 mg | ORAL_TABLET | Freq: Every day | ORAL | Status: DC
Start: 2022-01-08 — End: 2022-01-08
  Administered 2022-01-08: 40 mg via ORAL
  Filled 2022-01-08: qty 2

## 2022-01-08 MED ORDER — IPRATROPIUM-ALBUTEROL 0.5-2.5 (3) MG/3ML IN SOLN: 3.0000 mL | Freq: Two times a day (BID) | RESPIRATORY_TRACT | Status: DC

## 2022-01-08 MED ORDER — METHYLPREDNISOLONE SODIUM SUCC 40 MG IJ SOLR
40.0000 mg | Freq: Two times a day (BID) | INTRAMUSCULAR | Status: DC
  Administered 2022-01-08 – 2022-01-09 (×4): 40 mg via INTRAVENOUS
  Filled 2022-01-08 (×4): qty 1

## 2022-01-08 NOTE — Progress Notes (Signed)
Occupational Therapy Treatment Patient Details Name: Renee [name] MRN: [id number] DOB: 1928-12-28 Today's Date: 01/08/2022   History of present illness Renee [name] is a 88 y.o. female who presented with geenralized weakness ongoing for 3 days. Pt admitted for  severe sepsis due to community-acquired pneumonia . Pt with medical history significant for type 2 diabetes mellitus, mild intermittent asthma, essential pretension, allergic rhinitis, chronic diastolic heart failure.   OT comments  Pt progressing gradually towards OT goals, remains limited by decreased activity tolerance. Overall, pt progressing from Mod A to Min A for sit to stand transfers using RW. Pt reports typically requiring assist for initial standing at home. Pt requires hands on assist to complete BSC transfers and toileting tasks with Min A. Pt endorses fatigue after ADLs though SpO2 WFL on RA. Educated re: having sister in law come by daily after caregiver leaves for the day, consideration of wheelchair use in the home if continued difficulty managing household ambulation, and pursed lip breathing with ADLs/mobility.   SpO2 >91% on RA with activity. Pt reports feeling more comfortable with O2 on, so reapplied 1 L O2 with SpO2 97% at rest.   Recommendations for follow up therapy are one component of a multi-disciplinary discharge planning process, led by the attending physician.  Recommendations may be updated based on patient status, additional functional criteria and insurance authorization.    Follow Up Recommendations  Home health OT    Assistance Recommended at Discharge Frequent or constant Supervision/Assistance  Patient can return home with the following  A lot of help with walking and/or transfers;A lot of help with bathing/dressing/bathroom;Assistance with cooking/housework;Assistance with feeding;Direct supervision/assist for medications management;Direct supervision/assist for financial management;Help with  stairs or ramp for entrance   Equipment Recommendations  Wheelchair (measurements OT);Wheelchair cushion (measurements OT)    Recommendations for Other Services      Precautions / Restrictions Precautions Precautions: Fall Precaution Comments: watch O2 Restrictions Weight Bearing Restrictions: No       Mobility Bed Mobility Overal bed mobility: Needs Assistance Bed Mobility: Supine to Sit     Supine to sit: Min assist     General bed mobility comments: assist to scoot hips to EOB    Transfers Overall transfer level: Needs assistance Equipment used: Rolling walker (2 wheels) Transfers: Sit to/from Stand, Bed to chair/wheelchair/BSC Sit to Stand: Mod assist     Step pivot transfers: Min assist     General transfer comment: Initially mod A to stand from bedside, improving to Min A to stand from [facility] pushing from armrests. hands on assist needed for pivot to [facility] and recliner afterwards with balance/RW advancement assist     Balance Overall balance assessment: Needs assistance Sitting-balance support: Feet supported Sitting balance-Leahy Scale: Fair     Standing balance support: Bilateral upper extremity supported Standing balance-Leahy Scale: Poor                             ADL either performed or assessed with clinical judgement   ADL Overall ADL's : Needs assistance/impaired Eating/Feeding: Set up;Sitting Eating/Feeding Details (indicate cue type and reason): assist to open containers                     Toilet Transfer: Minimal assistance;Stand-pivot;Regular Toilet;Rolling walker (2 wheels) Toilet Transfer Details (indicate cue type and reason): Min A to power up from [facility] using armrests, increased time to step to/from [facility] using RW Toileting- Clothing Manipulation  and Hygiene: Minimal assistance;Sitting/lateral lean;Sit to/from stand Toileting - Clothing Manipulation Details (indicate cue type and reason): assist for clothing mgmt, pt able  to perform hygiene in standing though benefits from assist to maintain balance       General ADL Comments: Pt reports fatigue after toileting tasks though SpO2 > 91% on RA. Encouraged pt to have sister in law check in more frequently during the evening after caregiver leaves. pt reports wearing depends at night, does not have to get up during the night once in bed    Extremity/Trunk Assessment Upper Extremity Assessment Upper Extremity Assessment: Generalized weakness   Lower Extremity Assessment Lower Extremity Assessment: Defer to PT evaluation        Vision   Vision Assessment?: No apparent visual deficits   Perception     Praxis      Cognition Arousal/Alertness: Awake/alert Behavior During Therapy: Flat affect Overall Cognitive Status: Impaired/Different from baseline Area of Impairment: Problem solving                             Problem Solving: Slow processing, Requires verbal cues General Comments: incr time and cues required for sequencing; delayed responses likely due to [facility]        Exercises Exercises: Other exercises Other Exercises Other Exercises: incentive spirometer to 257m    Shoulder Instructions       General Comments      Pertinent Vitals/ Pain       Pain Assessment Pain Assessment: No/denies pain  Home Living                                          Prior Functioning/Environment              Frequency  Min 2X/week        Progress Toward Goals  OT Goals(current goals can now be found in the care plan section)  Progress towards OT goals: Progressing toward goals  Acute Rehab OT Goals Patient Stated Goal: go home when ready OT Goal Formulation: With patient Time For Goal Achievement: 01/20/22 Potential to Achieve Goals: Good ADL Goals Pt Will Perform Grooming: with set-up;sitting Pt Will Perform Lower Body Bathing: with min assist;sit to/from stand Pt Will Perform Lower Body Dressing: with  min assist;sit to/from stand Pt Will Transfer to Toilet: with min assist;ambulating Additional ADL Goal #1: pt will tolerate at least 3 minutes of OOB standing functional activity with min G  Plan Discharge plan remains appropriate    Co-evaluation                 AM-PAC OT "6 Clicks" Daily Activity     Outcome Measure   Help from another person eating meals?: A Little Help from another person taking care of personal grooming?: A Little Help from another person toileting, which includes using toliet, bedpan, or urinal?: A Little Help from another person bathing (including washing, rinsing, drying)?: A Lot Help from another person to put on and taking off regular upper body clothing?: A Little Help from another person to put on and taking off regular lower body clothing?: A Lot 6 Click Score: 16    End of Session Equipment Utilized During Treatment: Gait belt;Rolling walker (2 wheels);Oxygen  OT Visit Diagnosis: Other abnormalities of gait and mobility (R26.89);Unsteadiness on feet (R26.81);Muscle weakness (generalized) (M62.81)  Activity Tolerance Patient tolerated treatment well   Patient Left in chair;with call bell/phone within reach;with chair alarm set   Nurse Communication Mobility status;Other (comment) (O2, purewick out)        Time: 0735-4301 OT Time Calculation (min): 33 min  Charges: OT General Charges $OT Visit: 1 Visit OT Treatments $Self Care/Home Management : 23-37 mins  Malachy Chamber, OTR/L Acute Rehab Services Office: [phone]   Layla Maw 01/08/2022, 8:39 AM

## 2022-01-08 NOTE — Progress Notes (Signed)
Inpatient Diabetes Program Recommendations  AACE/ADA: New Consensus Statement on Inpatient Glycemic Control (2015)  Target Ranges:  Prepandial:   less than 140 mg/dL      Peak postprandial:   less than 180 mg/dL (1-2 hours)      Critically ill patients:  140 - 180 mg/dL   Lab Results  Component Value Date   GLUCAP 143 (H) 01/08/2022   HGBA1C 6.8 (H) 12/25/2021    Review of Glycemic Control  Latest Reference Range & Units 01/07/22 08:53 01/07/22 12:02 01/07/22 15:02 01/07/22 21:15 01/08/22 00:01 01/08/22 07:23  Glucose-Capillary 70 - 99 mg/dL 96 198 (H) 329 (H) 427 (H) 320 (H) 143 (H)   Diabetes history: DM 2 Outpatient Diabetes medications: Glipizide 5 mg Daily, Januvia 100 mg Daily, Actos 30 mg Daily Current orders for Inpatient glycemic control:  Novolog 0-20 units tid + hs  PO prednisone 40 mg Daily A1c 6.8% on 5/8  Inpatient Diabetes Program Recommendations:    While on steroids consider: -  Starting Novolog 3 units tid meal coverage if eating >50% of meals  Thanks,  Tama Headings RN, MSN, BC-ADM Inpatient Diabetes Coordinator Team Pager [phone] (8a-5p)

## 2022-01-08 NOTE — Progress Notes (Signed)
PT Cancellation Note  Patient Details Name: Renee [name] MRN: [id number] DOB: 24-Dec-1928   Cancelled Treatment:    Reason Eval/Treat Not Completed: Fatigue/lethargy limiting ability to participate. Patient sleeping on arrival. Will re-attempt later today.    Eldrige Pitkin 01/08/2022, 10:40 AM

## 2022-01-08 NOTE — Care Management Important Message (Signed)
Important Message  Patient Details  Name: Renee [name] MRN: [id number] Date of Birth: [birth year]   Medicare Important Message Given:  Yes     Merryn Thaker [address] 01/08/2022, 3:47 PM

## 2022-01-08 NOTE — Progress Notes (Signed)
Physical Therapy Treatment Patient Details Name: Renee [name] MRN: [id number] DOB: 12-31-1928 Today's Date: 01/08/2022   History of Present Illness Renee [name] is a 88 y.o. female who presented with geenralized weakness ongoing for 3 days. Pt admitted for  severe sepsis due to community-acquired pneumonia . Pt with medical history significant for type 2 diabetes mellitus, mild intermittent asthma, essential pretension, allergic rhinitis, chronic diastolic heart failure.    PT Comments    Patient received in recliner. She reports she would like to walk ( with encouragement from caregiver) and wants to get back in bed. Patient requires mod A to stand from recliner with increased time. She is able to walk 15 feet with RW. Generally unsafe with close min guard. Cues needed for upright posture and positioning with RW. Patient will continue to benefit from skilled PT while here to improve functional independence and safety with mobility.      Recommendations for follow up therapy are one component of a multi-disciplinary discharge planning process, led by the attending physician.  Recommendations may be updated based on patient status, additional functional criteria and insurance authorization.  Follow Up Recommendations  Home health PT     Assistance Recommended at Discharge Frequent or constant Supervision/Assistance  Patient can return home with the following A little help with walking and/or transfers;A little help with bathing/dressing/bathroom;Assistance with cooking/housework;Assist for transportation;Help with stairs or ramp for entrance   Equipment Recommendations  None recommended by PT    Recommendations for Other Services       Precautions / Restrictions Precautions Precautions: Fall Restrictions Weight Bearing Restrictions: No     Mobility  Bed Mobility Overal bed mobility: Needs Assistance Bed Mobility: Sit to Supine       Sit to supine: Min assist   General  bed mobility comments: required assist for positioning in bed    Transfers Overall transfer level: Needs assistance Equipment used: Rolling walker (2 wheels) Transfers: Sit to/from Stand Sit to Stand: Mod assist           General transfer comment: mod assist to stand from recliner. Increased time to get fully upright with cues.    Ambulation/Gait Ambulation/Gait assistance: Min guard Gait Distance (Feet): 15 Feet Assistive device: Rolling walker (2 wheels) Gait Pattern/deviations: Step-to pattern, Decreased step length - right, Decreased step length - left, Shuffle, Decreased stride length, Trunk flexed Gait velocity: decreased     General Gait Details: B LE weakness, min guard, cues for staying close to walker and upright posture.   Stairs             Wheelchair Mobility    Modified Rankin (Stroke Patients Only)       Balance Overall balance assessment: Needs assistance Sitting-balance support: Feet supported Sitting balance-Leahy Scale: Fair     Standing balance support: Bilateral upper extremity supported, During functional activity, Reliant on assistive device for balance Standing balance-Leahy Scale: Poor                              Cognition Arousal/Alertness: Awake/alert Behavior During Therapy: Flat affect Overall Cognitive Status: Impaired/Different from baseline Area of Impairment: Safety/judgement, Problem solving                   Current Attention Level: Sustained   Following Commands: Follows one step commands with increased time     Problem Solving: Slow processing, Requires verbal cues General Comments: incr time and cues  required for sequencing; delayed responses likely due to [facility]        Exercises      General Comments        Pertinent Vitals/Pain Pain Assessment Pain Assessment: No/denies pain    Home Living                          Prior Function            PT Goals (current goals  can now be found in the care plan section) Acute Rehab PT Goals Patient Stated Goal: to go home PT Goal Formulation: With patient Time For Goal Achievement: 01/20/22 Potential to Achieve Goals: Fair Progress towards PT goals: Progressing toward goals    Frequency    Min 3X/week      PT Plan Current plan remains appropriate    Co-evaluation              AM-PAC PT "6 Clicks" Mobility   Outcome Measure  Help needed turning from your back to your side while in a flat bed without using bedrails?: A Little Help needed moving from lying on your back to sitting on the side of a flat bed without using bedrails?: A Little Help needed moving to and from a bed to a chair (including a wheelchair)?: A Little Help needed standing up from a chair using your arms (e.g., wheelchair or bedside chair)?: A Lot Help needed to walk in hospital room?: A Little Help needed climbing 3-5 steps with a railing? : A Lot 6 Click Score: 16    End of Session Equipment Utilized During Treatment: Gait belt;Oxygen Activity Tolerance: Patient limited by fatigue Patient left: in bed;Other (comment) (X ray techs in room at end of session) Nurse Communication: Mobility status PT Visit Diagnosis: Unsteadiness on feet (R26.81);Muscle weakness (generalized) (M62.81);Difficulty in walking, not elsewhere classified (R26.2)     Time: 1255-1315 PT Time Calculation (min) (ACUTE ONLY): 20 min  Charges:  $Gait Training: 8-22 mins                     Renee [name], PT, GCS 01/08/22,1:59 PM

## 2022-01-08 NOTE — Progress Notes (Signed)
Patient's bedtime CBG 427 and asymptomatic; on call provider notified and new orders received. STAT lab glucose 448, 8 units insulin aspart given, per order.  Upon further assessment, pt was found to be drinking non-diet Sprite. Pt educated on carbohydrates/sugar intake and the effects on blood glucose. This RN poured out Sprite and replaced with diet [place]. Will continue to monitor.

## 2022-01-08 NOTE — Progress Notes (Signed)
  Progress Note Patient: Renee [name] EUM:[id number] DOB: [birth year] DOA: 01/05/2022  DOS: the patient was seen and examined on 01/08/2022  Brief hospital course: PMH of type II DM, HTN, chronic HFpEF, OSA, obesity.  Presents with complaints of shortness of breath and cough found to have pneumonia as well as possible mild exacerbation of asthma.  Assessment and Plan: * Sepsis due to pneumonia ([place]) Acute hypoxic respiratory failure. Mild asthma exacerbation. With fever, tachypnea and elevated lactic acid meets SIRS criteria.  Chest x-ray showing evidence of pneumonia with elevated procalcitonin minutes criteria for infection. O2 saturation 88% on room air with tachypnea on admission.Blood cultures so far negative. Continue with IV antibiotics. Continue nebulizer therapy. Continue steroids changed to IV.    Acute renal failure superimposed on stage 3b chronic kidney disease (HCC) Baseline serum creatinine around 1.3.  GFR more than 30 prior to admission. On admission serum creatinine 1.8 continues to trend up right now.  Now trending down.  Will provide IV Lasix and therefore hard to monitor renal function.  Chronic diastolic CHF (congestive heart failure) (HCC) Mildly volume overloaded.  Chest x-ray shows evidence of vascular congestion. Echocardiogram shows preserved EF. Also shows pericardial effusion moderate without any tamponade. We will treat with IV Lasix and monitor.  Obesity (BMI 30-39.9) OSA Not on CPAP  Body mass index is 31.63 kg/m.  Placing the pt at higher risk of poor outcomes.  History of CVA (cerebrovascular accident) With left hemiparesis. Exam unchanged. Monitor.  PT OT recommends home with home health.  Anxiety and depression Continue home Xanax.  Essential hypertension Blood pressure stable.  Monitor.  Avoiding hypertensive medications.  Type 2 diabetes, controlled, with retinopathy ([place]) Hemoglobin A1c 6.8 roughly 2 weeks ago. Not on any insulin at  home. Continue sliding scale insulin for now.  Subjective: No nausea no vomiting no fever no chills.  Continues to have cough.  Continues to have sneezing.  Appears to have increasing wheezing today.  Physical Exam: Vitals:   01/08/22 0855 01/08/22 1337 01/08/22 1500 01/08/22 1625  BP:  (!) 147/60    Pulse:  63    Resp:  18    Temp:  98.4 F (36.9 C)    TempSrc:  Oral    SpO2: 96% 97%  97%  Weight:      Height:   '5\' 3"'$  (1.6 m)    General: Appear in moderate distress; no visible Abnormal Neck Mass Or lumps, Conjunctiva normal Cardiovascular: S1 and S2 Present, nio Murmur, Respiratory: increased respiratory effort, Bilateral Air entry present and  bilateral Crackles, bilateral wheezes Abdomen: Bowel Sound present, Non tender  Extremities: trace Pedal edema Neurology: alert and oriented to time, place, and person.  Left-sided weakness. Gait not checked due to patient safety concerns   Data Reviewed: I have Reviewed nursing notes, Vitals, and Lab results since pt's last encounter. Pertinent lab results CBC and BMP and chest x-ray I have ordered test including CBC and BMP    Family Communication: Caretaker At bedside  Disposition: Status is: Inpatient Remains inpatient appropriate because: Still in severe respiratory distress requiring IV therapies.  Author: Berle Mull, MD 01/08/2022 6:45 PM  Please look on [email or web address] to find out who is on call.

## 2022-01-09 DIAGNOSIS — J189 Pneumonia, unspecified organism: Secondary | ICD-10-CM | POA: Diagnosis not present

## 2022-01-09 DIAGNOSIS — A419 Sepsis, unspecified organism: Secondary | ICD-10-CM | POA: Diagnosis not present

## 2022-01-09 LAB — GLUCOSE, CAPILLARY
Glucose-Capillary: 116 mg/dL — ABNORMAL HIGH (ref 70–99)
Glucose-Capillary: 213 mg/dL — ABNORMAL HIGH (ref 70–99)
Glucose-Capillary: 348 mg/dL — ABNORMAL HIGH (ref 70–99)
Glucose-Capillary: 349 mg/dL — ABNORMAL HIGH (ref 70–99)
Glucose-Capillary: 374 mg/dL — ABNORMAL HIGH (ref 70–99)

## 2022-01-09 LAB — CBC
HCT: 30.3 % — ABNORMAL LOW (ref 36.0–46.0)
Hemoglobin: 9.5 g/dL — ABNORMAL LOW (ref 12.0–15.0)
MCH: 28.4 pg (ref 26.0–34.0)
MCHC: 31.4 g/dL (ref 30.0–36.0)
MCV: 90.7 fL (ref 80.0–100.0)
Platelets: 130 10*3/uL — ABNORMAL LOW (ref 150–400)
RBC: 3.34 MIL/uL — ABNORMAL LOW (ref 3.87–5.11)
RDW: 15 % (ref 11.5–15.5)
WBC: 4.5 10*3/uL (ref 4.0–10.5)
nRBC: 0 % (ref 0.0–0.2)

## 2022-01-09 LAB — BASIC METABOLIC PANEL
Anion gap: 8 (ref 5–15)
BUN: 39 mg/dL — ABNORMAL HIGH (ref 8–23)
CO2: 25 mmol/L (ref 22–32)
Calcium: 8.3 mg/dL — ABNORMAL LOW (ref 8.9–10.3)
Chloride: 103 mmol/L (ref 98–111)
Creatinine, Ser: 1.48 mg/dL — ABNORMAL HIGH (ref 0.44–1.00)
GFR, Estimated: 33 mL/min — ABNORMAL LOW (ref >60)
Glucose, Bld: 341 mg/dL — ABNORMAL HIGH (ref 70–99)
Potassium: 4.8 mmol/L (ref 3.5–5.1)
Sodium: 136 mmol/L (ref 135–145)

## 2022-01-09 LAB — MAGNESIUM: Magnesium: 1.9 mg/dL (ref 1.7–2.4)

## 2022-01-09 MED ORDER — INSULIN GLARGINE-YFGN 100 UNIT/ML ~~LOC~~ SOLN
8.0000 [IU] | Freq: Every day | SUBCUTANEOUS | Status: DC
  Administered 2022-01-09: 8 [IU] via SUBCUTANEOUS
  Filled 2022-01-09 (×2): qty 0.08

## 2022-01-09 MED ORDER — ALBUTEROL SULFATE (2.5 MG/3ML) 0.083% IN NEBU
2.5000 mg | INHALATION_SOLUTION | Freq: Four times a day (QID) | RESPIRATORY_TRACT | Status: DC | PRN
  Administered 2022-01-11: 2.5 mg via RESPIRATORY_TRACT
  Filled 2022-01-09: qty 3

## 2022-01-09 MED ORDER — METHYLPREDNISOLONE SODIUM SUCC 40 MG IJ SOLR
40.0000 mg | Freq: Every day | INTRAMUSCULAR | Status: DC
Start: 2022-01-10 — End: 2022-01-11
  Administered 2022-01-10 – 2022-01-11 (×2): 40 mg via INTRAVENOUS
  Filled 2022-01-09 (×2): qty 1

## 2022-01-09 MED ORDER — FUROSEMIDE 10 MG/ML IJ SOLN
20.0000 mg | Freq: Once | INTRAMUSCULAR | Status: AC
  Administered 2022-01-09: 20 mg via INTRAVENOUS
  Filled 2022-01-09: qty 2

## 2022-01-09 NOTE — Telephone Encounter (Signed)
LOV 12/25/21 Last refill 11/11/21, #30, 3 refills  Please review, thanks!

## 2022-01-09 NOTE — Progress Notes (Signed)
Inpatient Diabetes Program Recommendations  AACE/ADA: New Consensus Statement on Inpatient Glycemic Control   Target Ranges:  Prepandial:   less than 140 mg/dL      Peak postprandial:   less than 180 mg/dL (1-2 hours)      Critically ill patients:  140 - 180 mg/dL    Latest Reference Range & Units 01/09/22 00:09 01/09/22 07:42  Glucose-Capillary 70 - 99 mg/dL 349 (H) 348 (H)    Latest Reference Range & Units 01/08/22 07:23 01/08/22 11:58 01/08/22 16:37 01/08/22 20:07  Glucose-Capillary 70 - 99 mg/dL 143 (H) 243 (H) 238 (H) 337 (H)   Review of Glycemic Control  Diabetes history: DM2 Outpatient Diabetes medications: Glipizide 5 mg daily, Januvia 100 mg daily, Actos 30 mg daily Current orders for Inpatient glycemic control: Semglee 8 units daily, Novolog 0-20 units TID with meals, Novolog 0-5 units QHS; Solumedrol 40 mg BID  Inpatient Diabetes Program Recommendations:    Insulin: Noted Semglee added today and steroids changed from Prednisone to Solumedrol. If steroids are continued as ordered, please consider ordering Novolog 5 units TID with meals for meal coverage if patient eats at least 50% of meals.  Thanks, Barnie Alderman, RN, MSN, CDE Diabetes Coordinator Inpatient Diabetes Program [phone] (Team Pager from 8am to 5pm)

## 2022-01-09 NOTE — Telephone Encounter (Signed)
Requested medication (s) are due for refill today: yes  Requested medication (s) are on the active medication list: yes  Last refill:  12/25/21  Future visit scheduled: no  Notes to clinic:  off protocol- per OV note dated 12/25/21, pt is to take med everyday as a preventative for UTI   Requested Prescriptions  Pending Prescriptions Disp Refills   cephALEXin (KEFLEX) 500 MG capsule [Pharmacy Med Name: CEPHALEXIN 500 MG CAPSULE] 30 capsule 3    Sig: TAKE 1 CAPSULE (500 MG TOTAL) BY MOUTH DAILY     Off-Protocol Failed - 01/07/2022  9:32 AM      Failed - Medication not assigned to a protocol, review manually.      Passed - Valid encounter within last 12 months    Recent Outpatient Visits           2 weeks ago Type 2 diabetes mellitus with hyperglycemia, without long-term current use of insulin ([place])   [place] Pickard, Cammie Mcgee, MD   2 months ago 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, without long-term current use of insulin, macular edema presence unspecified, unspecified retinopathy severity ([place])   [place] [name], MD   3 months ago Type 2 diabetes mellitus with hyperglycemia, without long-term current use of insulin ([place])   [place] [name], MD   6 months ago Mild persistent asthma with exacerbation   [facility] [name], NP   6 months ago Mild persistent asthma with exacerbation   [place] Pickard, Cammie Mcgee, MD

## 2022-01-09 NOTE — Progress Notes (Signed)
  Progress Note Patient: Renee [name] TDV:[id number] DOB: 1928-10-07 DOA: 01/05/2022  DOS: the patient was seen and examined on 01/09/2022  Brief hospital course: PMH of type II DM, HTN, chronic HFpEF, OSA, obesity.  Presents with complaints of shortness of breath and cough found to have pneumonia as well as possible mild exacerbation of asthma.  Assessment and Plan: * Sepsis due to pneumonia ([place]) Acute hypoxic respiratory failure. Mild asthma exacerbation. With fever, tachypnea and elevated lactic acid meets SIRS criteria.  Chest x-ray showing evidence of pneumonia with elevated procalcitonin minutes criteria for infection. O2 saturation 88% on room air with tachypnea on admission.  Currently 94% on room air. Blood cultures so far negative. Continue with IV antibiotics. Continue nebulizer therapy. Continue steroids changed to IV.    Acute renal failure superimposed on stage 3b chronic kidney disease (HCC) Baseline serum creatinine around 1.3.  GFR more than 30 prior to admission. Serum creatinine on admission 1.8, worsened to 2.2 with improvement with IV hydration.  Now 1.6 with IV Lasix. Continue IV Lasix.  Chronic diastolic CHF (congestive heart failure) (HCC) Moderate pericardial effusion. Mildly volume overloaded.  Chest x-ray shows evidence of vascular congestion. Echocardiogram shows preserved EF. Also shows pericardial effusion moderate without any tamponade. We will treat with IV Lasix and monitor. Recommend continuing high-dose of Lasix on discharge and repeat echocardiogram after discharge.  Obesity (BMI 30-39.9) OSA Not on CPAP  Body mass index is 31.52 kg/m.  Placing the pt at higher risk of poor outcomes.  History of CVA (cerebrovascular accident) With left hemiparesis. Exam unchanged. Monitor.  PT OT recommends home with home health.  Anxiety and depression Continue home Xanax.  Essential hypertension Blood pressure stable.   Type 2 diabetes, controlled,  with retinopathy ([place]) Uncontrolled with hyperglycemia due to steroids. Hemoglobin A1c 6.8 roughly 2 weeks ago. Not on any insulin at home. Continue sliding scale insulin for now.  Subjective: Feeling better but continues to have cough continues to have wheezing.  No nausea no vomiting.  No chest pain abdominal pain.  Physical Exam: Vitals:   01/09/22 0800 01/09/22 1522 01/09/22 1540 01/09/22 1915  BP:  (!) 138/57  134/64  Pulse:  65  72  Resp:  19  20  Temp:  98.8 F (37.1 C)  98.5 F (36.9 C)  TempSrc:  Oral  Oral  SpO2: 97% 94% 95% 93%  Weight:      Height:       General: Appear in mild distress; no visible Abnormal Neck Mass Or lumps, Conjunctiva normal Cardiovascular: S1 and S2 Present, no Murmur, Respiratory: increased respiratory effort, Bilateral Air entry present and  no Crackles, bilateral  wheezes Abdomen: Bowel Sound present, Non tender  Extremities: no Pedal edema Neurology: alert and oriented to time, place, and person, chronic left hemiparesis Gait not checked due to patient safety concerns   Data Reviewed: I have Reviewed nursing notes, Vitals, and Lab results since pt's last encounter. Pertinent lab results CBC and BMP I have ordered test including CBC and BMP    Family Communication: Caregiver at bedside  Disposition: Status is: Inpatient Remains inpatient appropriate because: Continues to have respiratory distress requiring IV steroids and nebulizer therapies as well as IV Lasix.  PT recommends home with home health.  Author: Berle Mull, MD 01/09/2022 8:28 PM  Please look on [email or web address] to find out who is on call.

## 2022-01-10 DIAGNOSIS — N179 Acute kidney failure, unspecified: Secondary | ICD-10-CM | POA: Diagnosis not present

## 2022-01-10 DIAGNOSIS — J301 Allergic rhinitis due to pollen: Secondary | ICD-10-CM | POA: Diagnosis not present

## 2022-01-10 DIAGNOSIS — J189 Pneumonia, unspecified organism: Secondary | ICD-10-CM | POA: Diagnosis not present

## 2022-01-10 DIAGNOSIS — Z8673 Personal history of transient ischemic attack (TIA), and cerebral infarction without residual deficits: Secondary | ICD-10-CM

## 2022-01-10 DIAGNOSIS — E669 Obesity, unspecified: Secondary | ICD-10-CM

## 2022-01-10 DIAGNOSIS — J9601 Acute respiratory failure with hypoxia: Secondary | ICD-10-CM | POA: Diagnosis not present

## 2022-01-10 LAB — COMPREHENSIVE METABOLIC PANEL
ALT: 16 U/L (ref 0–44)
AST: 16 U/L (ref 15–41)
Albumin: 2.5 g/dL — ABNORMAL LOW (ref 3.5–5.0)
Alkaline Phosphatase: 54 U/L (ref 38–126)
Anion gap: 5 (ref 5–15)
BUN: 36 mg/dL — ABNORMAL HIGH (ref 8–23)
CO2: 29 mmol/L (ref 22–32)
Calcium: 8.4 mg/dL — ABNORMAL LOW (ref 8.9–10.3)
Chloride: 101 mmol/L (ref 98–111)
Creatinine, Ser: 1.36 mg/dL — ABNORMAL HIGH (ref 0.44–1.00)
GFR, Estimated: 37 mL/min — ABNORMAL LOW (ref >60)
Glucose, Bld: 259 mg/dL — ABNORMAL HIGH (ref 70–99)
Potassium: 5.1 mmol/L (ref 3.5–5.1)
Sodium: 135 mmol/L (ref 135–145)
Total Bilirubin: 0.3 mg/dL (ref 0.3–1.2)
Total Protein: 4.9 g/dL — ABNORMAL LOW (ref 6.5–8.1)

## 2022-01-10 LAB — CBC
HCT: 30.7 % — ABNORMAL LOW (ref 36.0–46.0)
Hemoglobin: 9.9 g/dL — ABNORMAL LOW (ref 12.0–15.0)
MCH: 28.4 pg (ref 26.0–34.0)
MCHC: 32.2 g/dL (ref 30.0–36.0)
MCV: 88 fL (ref 80.0–100.0)
Platelets: 158 10*3/uL (ref 150–400)
RBC: 3.49 MIL/uL — ABNORMAL LOW (ref 3.87–5.11)
RDW: 14.6 % (ref 11.5–15.5)
WBC: 6.4 10*3/uL (ref 4.0–10.5)
nRBC: 0 % (ref 0.0–0.2)

## 2022-01-10 LAB — GLUCOSE, CAPILLARY
Glucose-Capillary: 214 mg/dL — ABNORMAL HIGH (ref 70–99)
Glucose-Capillary: 305 mg/dL — ABNORMAL HIGH (ref 70–99)
Glucose-Capillary: 371 mg/dL — ABNORMAL HIGH (ref 70–99)
Glucose-Capillary: 244 mg/dL — ABNORMAL HIGH (ref 70–99)

## 2022-01-10 LAB — PROCALCITONIN: Procalcitonin: <0.1 ng/mL

## 2022-01-10 LAB — CULTURE, BLOOD (ROUTINE X 2)
Culture: NO GROWTH
Culture: NO GROWTH
Special Requests: ADEQUATE
Special Requests: ADEQUATE

## 2022-01-10 LAB — METHYLMALONIC ACID, SERUM: Methylmalonic Acid, Quantitative: 316 nmol/L (ref 0–378)

## 2022-01-10 LAB — MAGNESIUM: Magnesium: 1.9 mg/dL (ref 1.7–2.4)

## 2022-01-10 MED ORDER — VITAMIN B-12 1000 MCG PO TABS
1000.0000 ug | ORAL_TABLET | Freq: Every day | ORAL | Status: DC
  Administered 2022-01-10 – 2022-01-16 (×7): 1000 ug via ORAL
  Filled 2022-01-10 (×7): qty 1

## 2022-01-10 MED ORDER — BUDESONIDE 0.5 MG/2ML IN SUSP
0.5000 mg | Freq: Two times a day (BID) | RESPIRATORY_TRACT | Status: DC
  Administered 2022-01-10 – 2022-01-16 (×13): 0.5 mg via RESPIRATORY_TRACT
  Filled 2022-01-10 (×14): qty 2

## 2022-01-10 MED ORDER — SERTRALINE HCL 50 MG PO TABS
50.0000 mg | ORAL_TABLET | Freq: Every day | ORAL | Status: DC
  Administered 2022-01-10 – 2022-01-16 (×7): 50 mg via ORAL
  Filled 2022-01-10 (×7): qty 1

## 2022-01-10 MED ORDER — HYDRALAZINE HCL 25 MG PO TABS
25.0000 mg | ORAL_TABLET | Freq: Three times a day (TID) | ORAL | Status: DC
  Administered 2022-01-10 – 2022-01-16 (×18): 25 mg via ORAL
  Filled 2022-01-10 (×18): qty 1

## 2022-01-10 MED ORDER — INSULIN GLARGINE-YFGN 100 UNIT/ML ~~LOC~~ SOLN
10.0000 [IU] | Freq: Every day | SUBCUTANEOUS | Status: DC
Start: 2022-01-10 — End: 2022-01-16
  Administered 2022-01-10 – 2022-01-15 (×6): 10 [IU] via SUBCUTANEOUS
  Filled 2022-01-10 (×7): qty 0.1

## 2022-01-10 MED ORDER — PANTOPRAZOLE SODIUM 40 MG PO TBEC
40.0000 mg | DELAYED_RELEASE_TABLET | Freq: Every day | ORAL | Status: DC
  Administered 2022-01-10 – 2022-01-16 (×7): 40 mg via ORAL
  Filled 2022-01-10 (×7): qty 1

## 2022-01-10 MED ORDER — FUROSEMIDE 10 MG/ML IJ SOLN
20.0000 mg | Freq: Once | INTRAMUSCULAR | Status: AC
  Administered 2022-01-10: 20 mg via INTRAVENOUS
  Filled 2022-01-10: qty 2

## 2022-01-10 MED ORDER — AMLODIPINE BESYLATE 5 MG PO TABS
5.0000 mg | ORAL_TABLET | Freq: Every day | ORAL | Status: DC
  Administered 2022-01-10 – 2022-01-11 (×2): 5 mg via ORAL
  Filled 2022-01-10 (×2): qty 1

## 2022-01-10 MED ORDER — DARIFENACIN HYDROBROMIDE ER 15 MG PO TB24
15.0000 mg | ORAL_TABLET | Freq: Every day | ORAL | Status: DC
Start: 2022-01-10 — End: 2022-01-16
  Administered 2022-01-10 – 2022-01-16 (×7): 15 mg via ORAL
  Filled 2022-01-10 (×7): qty 1

## 2022-01-10 MED ORDER — IPRATROPIUM-ALBUTEROL 0.5-2.5 (3) MG/3ML IN SOLN
3.0000 mL | Freq: Three times a day (TID) | RESPIRATORY_TRACT | Status: DC
  Administered 2022-01-10 – 2022-01-11 (×3): 3 mL via RESPIRATORY_TRACT
  Filled 2022-01-10 (×3): qty 3

## 2022-01-10 MED ORDER — LORATADINE 10 MG PO TABS
10.0000 mg | ORAL_TABLET | Freq: Every day | ORAL | Status: DC
  Administered 2022-01-10 – 2022-01-16 (×7): 10 mg via ORAL
  Filled 2022-01-10 (×7): qty 1

## 2022-01-10 NOTE — Progress Notes (Signed)
Physical Therapy Treatment Patient Details Name: Renee [name] MRN: [id number] DOB: 04/26/1929 Today's Date: 01/10/2022   History of Present Illness Renee [name] is a 88 y.o. female who presented with geenralized weakness ongoing for 3 days. Pt admitted for  severe sepsis due to community-acquired pneumonia . Pt with medical history significant for type 2 diabetes mellitus, mild intermittent asthma, essential pretension, allergic rhinitis, chronic diastolic heart failure.    PT Comments    Pt received supine and agreeable to session with good progress towards goals. Pt with increased tolerance for ambulation with RW on RA with O2 sats maintaining >92% throughout. Pt continues to be limited by general fatigue. Pt able to demonstrate bed mobility and transfers with up to min assist needed secondary to decreased strength. Pt educated re; incentive spirometer use and demonstrate up to 213m x 10 trials. Pt continues to benefit from skilled PT services to progress toward functional mobility goals.     Recommendations for follow up therapy are one component of a multi-disciplinary discharge planning process, led by the attending physician.  Recommendations may be updated based on patient status, additional functional criteria and insurance authorization.  Follow Up Recommendations  Home health PT     Assistance Recommended at Discharge Frequent or constant Supervision/Assistance  Patient can return home with the following A little help with walking and/or transfers;A little help with bathing/dressing/bathroom;Assistance with cooking/housework;Assist for transportation;Help with stairs or ramp for entrance   Equipment Recommendations  None recommended by PT    Recommendations for Other Services       Precautions / Restrictions Precautions Precautions: Fall Precaution Comments: watch O2 Restrictions Weight Bearing Restrictions: No     Mobility  Bed Mobility Overal bed mobility: Needs  Assistance Bed Mobility: Supine to Sit     Supine to sit: Min assist     General bed mobility comments: min assist to elevate trunk and slide out to EOB    Transfers Overall transfer level: Needs assistance Equipment used: Rolling walker (2 wheels) Transfers: Sit to/from Stand Sit to Stand: Min assist           General transfer comment: min a to power up    Ambulation/Gait Ambulation/Gait assistance: Min guard Gait Distance (Feet): 40 Feet Assistive device: Rolling walker (2 wheels) Gait Pattern/deviations: Step-to pattern, Decreased step length - right, Decreased step length - left, Shuffle, Decreased stride length, Trunk flexed Gait velocity: decreased     General Gait Details: B LE weakness, min guard, cues for staying close to walker and upright posture.   Stairs             Wheelchair Mobility    Modified Rankin (Stroke Patients Only)       Balance Overall balance assessment: Needs assistance Sitting-balance support: Feet supported Sitting balance-Leahy Scale: Fair     Standing balance support: Bilateral upper extremity supported, During functional activity, Reliant on assistive device for balance Standing balance-Leahy Scale: Poor                              Cognition Arousal/Alertness: Awake/alert Behavior During Therapy: Flat affect Overall Cognitive Status: Impaired/Different from baseline Area of Impairment: Safety/judgement, Problem solving                   Current Attention Level: Sustained   Following Commands: Follows one step commands with increased time     Problem Solving: Slow processing, Requires verbal cues General Comments:  incr time and cues required for sequencing; delayed responses likely due to [facility]        Exercises Other Exercises Other Exercises: incentive spirometer to 212m x10    General Comments General comments (skin integrity, edema, etc.): VSS on RA during ambulation with O2 sats  >93%      Pertinent Vitals/Pain      Home Living                          Prior Function            PT Goals (current goals can now be found in the care plan section) Acute Rehab PT Goals Patient Stated Goal: to go home PT Goal Formulation: With patient Time For Goal Achievement: 01/20/22    Frequency    Min 3X/week      PT Plan Current plan remains appropriate    Co-evaluation              AM-PAC PT "6 Clicks" Mobility   Outcome Measure  Help needed turning from your back to your side while in a flat bed without using bedrails?: A Little Help needed moving from lying on your back to sitting on the side of a flat bed without using bedrails?: A Little Help needed moving to and from a bed to a chair (including a wheelchair)?: A Little Help needed standing up from a chair using your arms (e.g., wheelchair or bedside chair)?: A Little Help needed to walk in hospital room?: A Little Help needed climbing 3-5 steps with a railing? : A Lot 6 Click Score: 17    End of Session Equipment Utilized During Treatment: Gait belt;Oxygen Activity Tolerance: Patient limited by fatigue Patient left: in chair;with call bell/phone within reach;with family/visitor present Nurse Communication: Mobility status PT Visit Diagnosis: Unsteadiness on feet (R26.81);Muscle weakness (generalized) (M62.81);Difficulty in walking, not elsewhere classified (R26.2)     Time: [address]-4920PT Time Calculation (min) (ACUTE ONLY): 27 min  Charges:  $Gait Training: 23-37 mins                    Renee [name] R. PTA Acute Rehabilitation Services Office: [address]5/24/2023, 4:15 PM

## 2022-01-10 NOTE — Progress Notes (Signed)
PROGRESS NOTE    Renee [name]  JJO:[id number] DOB: 09/12/1928 DOA: 01/05/2022 PCP: [name], MD    Chief Complaint  Patient presents with   Pneumonia   UTI    Brief Narrative:  PMH of type II DM, HTN, chronic HFpEF, OSA, obesity.  Presents with complaints of shortness of breath and cough found to have pneumonia as well as possible mild exacerbation of asthma.    Assessment & Plan:  Principal Problem:   Sepsis due to pneumonia [facility]) Active Problems:   CAP (community acquired pneumonia)   Acute respiratory failure with hypoxia ([place])   Acute renal failure superimposed on stage 3b chronic kidney disease (HCC)   Asthma, chronic, unspecified asthma severity, with acute exacerbation   Chronic diastolic CHF (congestive heart failure) (HCC)   Type 2 diabetes, controlled, with retinopathy ([place])   Essential hypertension   Anxiety and depression   History of CVA (cerebrovascular accident)   Generalized weakness   Allergic rhinitis   Obesity (BMI 30-39.9)    Assessment and Plan: * Sepsis due to pneumonia ([place]) Acute hypoxic respiratory failure. Mild asthma exacerbation. With fever, tachypnea and elevated lactic acid meets SIRS criteria.  Chest x-ray showing evidence of pneumonia with elevated procalcitonin minutes criteria for infection. O2 saturation 88% on room air with tachypnea on admission.  Currently 99% on 2 L nasal cannula. Blood cultures so far negative. -Patient still with some rhonchorous breath sounds and some wheezing. -Continue IV steroids. Continue with IV antibiotics. -Place on scheduled DuoNebs, Pulmicort, Claritin, PPI, Flonase. -Continue Mucinex. -Continue flutter valve, incentive spirometry. -Chest PT.  CAP (community acquired pneumonia) - See above.  Acute renal failure superimposed on stage 3b chronic kidney disease (HCC) Baseline serum creatinine around 1.3.  GFR more than 30 prior to admission. Serum creatinine on admission 1.8, worsened  to 2.2 with improvement with IV hydration.  -Patient placed on IV Lasix creatinine down to 1.36 today.  -Urine output of 1.8 L over the past 24 hours.  -Another dose of Lasix 20 mg IV x1 and resume home regimen oral Lasix tomorrow.   Chronic diastolic CHF (congestive heart failure) (HCC) Moderate pericardial effusion. Mildly volume overloaded.  Chest x-ray shows evidence of vascular congestion. Echocardiogram shows preserved EF. Also shows pericardial effusion moderate without any tamponade. -Patient has been receiving IV Lasix, give another dose of IV Lasix 20 mg x 1. -May need to increase home regimen Lasix from 10 mg to 20 mg daily on discharge.  Obesity (BMI 30-39.9) OSA Not on CPAP  Body mass index is 31.52 kg/m.  Placing the pt at higher risk of poor outcomes.  Allergic rhinitis - Claritin.  History of CVA (cerebrovascular accident) With left hemiparesis. Exam unchanged. Monitor.  PT OT recommends home with home health.  Anxiety and depression Continue home Xanax.  Essential hypertension -BP elevated this morning . -Start half home dose of Norvasc 5 mg daily.  Place on hydralazine 25 mg 3 times daily. -Follow.  Type 2 diabetes, controlled, with retinopathy ([place]) Uncontrolled with hyperglycemia due to steroids. Hemoglobin A1c 6.8 roughly 2 weeks ago. Not on any insulin at home. -Elevated CBGs in light secondary to steroids. -Increase Semglee to 10 units daily.  SSI         DVT prophylaxis: SCDs Code Status: DNR Family Communication: Updated patient and caretaker at bedside. Disposition: Likely home when clinically improved.  Status is: Inpatient Remains inpatient appropriate because: Severity of illness   Consultants:  None  Procedures:  Chest  x-ray 01/05/2022, 01/08/2022 2D echo 01/06/2022   Antimicrobials:  Review azithromycin 01/05/2022>>> 01/06/2022 IV Rocephin 01/05/2022>>>>>   Subjective: Patient sitting up in bed.  Caregiver at bedside.  Per  patient and caregiver wheezing has been worse today.  Patient with some rhonchi.  Patient states has clear sputum.  Caretaker asking when patient could have a shower.  Patient noted with sats of 99% on 2 L nasal cannula.  Objective: Vitals:   01/10/22 0845 01/10/22 1324 01/10/22 1500 01/10/22 2015  BP:   130/62 (!) 165/67  Pulse:  71  71  Resp:  (!) 23  20  Temp: 98.3 F (36.8 C)  98.2 F (36.8 C) 98.2 F (36.8 C)  TempSrc: Oral  Oral Oral  SpO2:  99%  100%  Weight:      Height:        Intake/Output Summary (Last 24 hours) at 01/10/2022 2020 Last data filed at 01/10/2022 1700 Gross per 24 hour  Intake 1020 ml  Output 600 ml  Net 420 ml   Filed Weights   01/05/22 2239 01/09/22 0500  Weight: 81 kg 80.7 kg    Examination:  General exam: Appears calm and comfortable  Respiratory system: Diffuse expiratory wheezing.  Some scattered rhonchi.  No crackles.  Fair air movement.  Speaking in full sentences Cardiovascular system: S1 & S2 heard, RRR. No JVD, murmurs, rubs, gallops or clicks. No pedal edema. Gastrointestinal system: Abdomen is nondistended, soft and nontender. No organomegaly or masses felt. Normal bowel sounds heard. Central nervous system: Alert and oriented. No focal neurological deficits. Extremities: Symmetric 5 x 5 power. Skin: No rashes, lesions or ulcers Psychiatry: Judgement and insight appear normal. Mood & affect appropriate.     Data Reviewed:   CBC: Recent Labs  Lab 01/05/22 1854 01/06/22 0223 01/07/22 0122 01/08/22 0411 01/09/22 0117 01/10/22 0349  WBC 9.9 7.7 7.5 5.2 4.5 6.4  NEUTROABS 8.8* 7.1  --   --   --   --   HGB 10.6* 9.8* 9.1* 8.7* 9.5* 9.9*  HCT 34.1* 30.6* 29.8* 28.0* 30.3* 30.7*  MCV 90.2 89.2 91.1 90.9 90.7 88.0  PLT 135* 120* 128* 125* 130* 761    Basic Metabolic Panel: Recent Labs  Lab 01/06/22 0223 01/07/22 0122 01/07/22 2152 01/08/22 0411 01/09/22 0117 01/10/22 0349  NA 140 141  --  139 136 135  K 4.1 4.4  --   4.1 4.8 5.1  CL 105 108  --  106 103 101  CO2 25 26  --  '26 25 29  '$ GLUCOSE 333* 173* 448* 188* 341* 259*  BUN 36* 40*  --  37* 39* 36*  CREATININE 2.12* 1.78*  --  1.60* 1.48* 1.36*  CALCIUM 8.6* 8.5*  --  8.2* 8.3* 8.4*  MG 1.8 2.2  --  2.1 1.9 1.9  PHOS 3.4  --   --   --   --   --     GFR: Estimated Creatinine Clearance: 27.6 mL/min (A) (by C-G formula based on SCr of 1.36 mg/dL (H)).  Liver Function Tests: Recent Labs  Lab 01/05/22 1854 01/06/22 0223 01/10/22 0349  AST '23 21 16  '$ ALT '17 17 16  '$ ALKPHOS 72 63 54  BILITOT 0.6 0.5 0.3  PROT 5.8* 5.3* 4.9*  ALBUMIN 3.3* 2.9* 2.5*    CBG: Recent Labs  Lab 01/09/22 1914 01/10/22 0750 01/10/22 1154 01/10/22 1646 01/10/22 2017  GLUCAP 213* 214* 305* 371* 244*     Recent Results (from the past  240 hour(s))  Resp Panel by RT-PCR (Flu A&B, Covid) Nasopharyngeal Swab     Status: None   Collection Time: 01/05/22  6:55 PM   Specimen: Nasopharyngeal Swab; Nasopharyngeal(NP) swabs in vial transport medium  Result Value Ref Range Status   SARS Coronavirus 2 by RT PCR NEGATIVE NEGATIVE Final    Comment: (NOTE) SARS-CoV-2 target nucleic acids are NOT DETECTED.  The SARS-CoV-2 RNA is generally detectable in upper respiratory specimens during the acute phase of infection. The lowest concentration of SARS-CoV-2 viral copies this assay can detect is 138 copies/mL. A negative result does not preclude SARS-Cov-2 infection and should not be used as the sole basis for treatment or other patient management decisions. A negative result may occur with  improper specimen collection/handling, submission of specimen other than nasopharyngeal swab, presence of viral mutation(s) within the areas targeted by this assay, and inadequate number of viral copies(<138 copies/mL). A negative result must be combined with clinical observations, patient history, and epidemiological information. The expected result is Negative.  Fact Sheet for  Patients:  EntrepreneurPulse.com.au  Fact Sheet for Healthcare Providers:  IncredibleEmployment.be  This test is no t yet approved or cleared by the [place] FDA and  has been authorized for detection and/or diagnosis of SARS-CoV-2 by FDA under an Emergency Use Authorization (EUA). This EUA will remain  in effect (meaning this test can be used) for the duration of the COVID-19 declaration under Section 564(b)(1) of the Act, 21 U.S.C.section 360bbb-3(b)(1), unless the authorization is terminated  or revoked sooner.       Influenza A by PCR NEGATIVE NEGATIVE Final   Influenza B by PCR NEGATIVE NEGATIVE Final    Comment: (NOTE) The Xpert Xpress SARS-CoV-2/FLU/RSV plus assay is intended as an aid in the diagnosis of influenza from Nasopharyngeal swab specimens and should not be used as a sole basis for treatment. Nasal washings and aspirates are unacceptable for Xpert Xpress SARS-CoV-2/FLU/RSV testing.  Fact Sheet for Patients: EntrepreneurPulse.com.au  Fact Sheet for Healthcare Providers: IncredibleEmployment.be  This test is not yet approved or cleared by the [place] FDA and has been authorized for detection and/or diagnosis of SARS-CoV-2 by FDA under an Emergency Use Authorization (EUA). This EUA will remain in effect (meaning this test can be used) for the duration of the COVID-19 declaration under Section 564(b)(1) of the Act, 21 U.S.C. section 360bbb-3(b)(1), unless the authorization is terminated or revoked.  Performed at [facility], [place] [address], [place], [place] [address]   Blood culture (routine x 2)     Status: None   Collection Time: 01/05/22  7:00 PM   Specimen: BLOOD  Result Value Ref Range Status   Specimen Description BLOOD RIGHT ANTECUBITAL  Final   Special Requests   Final    BOTTLES DRAWN AEROBIC AND ANAEROBIC Blood Culture adequate volume   Culture   Final    NO  GROWTH 5 DAYS Performed at [facility], [place] [address] [place], [place] [address]    Report Status 01/10/2022 FINAL  Final  Blood culture (routine x 2)     Status: None   Collection Time: 01/05/22  7:43 PM   Specimen: BLOOD  Result Value Ref Range Status   Specimen Description BLOOD BLOOD RIGHT FOREARM  Final   Special Requests   Final    BOTTLES DRAWN AEROBIC AND ANAEROBIC Blood Culture adequate volume   Culture   Final    NO GROWTH 5 DAYS Performed at [facility],  [address], [place], [place] [address]    Report Status 01/10/2022 FINAL  Final         Radiology Studies: No results found.      Scheduled Meds:  amLODipine  5 mg Oral Daily   aspirin EC  325 mg Oral Daily   benzonatate  100 mg Oral TID   budesonide (PULMICORT) nebulizer solution  0.5 mg Nebulization BID   darifenacin  15 mg Oral Daily   dextromethorphan-guaiFENesin  1 tablet Oral BID   docusate sodium  200 mg Oral QHS   fluticasone  2 spray Each Nare QHS   hydrALAZINE  25 mg Oral Q8H   insulin aspart  0-20 Units Subcutaneous TID WC   insulin aspart  0-5 Units Subcutaneous QHS   insulin glargine-yfgn  10 Units Subcutaneous Daily   ipratropium-albuterol  3 mL Nebulization TID   loratadine  10 mg Oral Daily   mouth rinse  15 mL Mouth Rinse BID   melatonin  3 mg Oral QHS   methylPREDNISolone (SOLU-MEDROL) injection  40 mg Intravenous Daily   pantoprazole  40 mg Oral Q0600   sertraline  50 mg Oral Daily   vitamin B-12  1,000 mcg Oral Daily   Continuous Infusions:  cefTRIAXone (ROCEPHIN)  IV 2 g (01/09/22 2118)     LOS: 5 days    Time spent: 35-minute    Irine Seal, MD [facility]   To contact the attending provider between 7A-7P or the covering provider during after hours 7P-7A, please log into the web site [email or web address] and access using universal [place] password for that web site. If you do not have the password, please call the hospital  operator.  01/10/2022, 8:20 PM

## 2022-01-10 NOTE — Assessment & Plan Note (Signed)
See above

## 2022-01-10 NOTE — Consult Note (Signed)
[facility] CM Inpatient Consult   01/10/2022  Renee [name] [birth year] [id number]  [place] [facility] CM)   Patient chart has been reviewed with noted high risk score for unplanned readmissions and for unplanned 30 day readmission.  Patient assessed for community [name] [facility] follow up needs. Per review, patient is active with chronic care coordination team at primary provider office.   Plan: Will continue to follow for progression and disposition plans.   Of note, [facility] Care Management services does not replace or interfere with any services that are arranged by inpatient case management or social work.    Netta Cedars, MSN, RN [facility] Liaison Toll free office [phone]

## 2022-01-10 NOTE — Assessment & Plan Note (Signed)
Claritin

## 2022-01-11 DIAGNOSIS — N179 Acute kidney failure, unspecified: Secondary | ICD-10-CM | POA: Diagnosis not present

## 2022-01-11 DIAGNOSIS — J301 Allergic rhinitis due to pollen: Secondary | ICD-10-CM | POA: Diagnosis not present

## 2022-01-11 DIAGNOSIS — J9601 Acute respiratory failure with hypoxia: Secondary | ICD-10-CM | POA: Diagnosis not present

## 2022-01-11 DIAGNOSIS — J189 Pneumonia, unspecified organism: Secondary | ICD-10-CM | POA: Diagnosis not present

## 2022-01-11 LAB — CBC
HCT: 31 % — ABNORMAL LOW (ref 36.0–46.0)
Hemoglobin: 9.8 g/dL — ABNORMAL LOW (ref 12.0–15.0)
MCH: 27.9 pg (ref 26.0–34.0)
MCHC: 31.6 g/dL (ref 30.0–36.0)
MCV: 88.3 fL (ref 80.0–100.0)
Platelets: 187 10*3/uL (ref 150–400)
RBC: 3.51 MIL/uL — ABNORMAL LOW (ref 3.87–5.11)
RDW: 14.7 % (ref 11.5–15.5)
WBC: 9.2 10*3/uL (ref 4.0–10.5)
nRBC: 0 % (ref 0.0–0.2)

## 2022-01-11 LAB — GLUCOSE, CAPILLARY
Glucose-Capillary: 125 mg/dL — ABNORMAL HIGH (ref 70–99)
Glucose-Capillary: 250 mg/dL — ABNORMAL HIGH (ref 70–99)
Glucose-Capillary: 223 mg/dL — ABNORMAL HIGH (ref 70–99)
Glucose-Capillary: 195 mg/dL — ABNORMAL HIGH (ref 70–99)

## 2022-01-11 LAB — COMPREHENSIVE METABOLIC PANEL
ALT: 25 U/L (ref 0–44)
AST: 21 U/L (ref 15–41)
Albumin: 2.7 g/dL — ABNORMAL LOW (ref 3.5–5.0)
Alkaline Phosphatase: 54 U/L (ref 38–126)
Anion gap: 6 (ref 5–15)
BUN: 45 mg/dL — ABNORMAL HIGH (ref 8–23)
CO2: 28 mmol/L (ref 22–32)
Calcium: 8.5 mg/dL — ABNORMAL LOW (ref 8.9–10.3)
Chloride: 101 mmol/L (ref 98–111)
Creatinine, Ser: 1.4 mg/dL — ABNORMAL HIGH (ref 0.44–1.00)
GFR, Estimated: 36 mL/min — ABNORMAL LOW (ref >60)
Glucose, Bld: 72 mg/dL (ref 70–99)
Potassium: 4.1 mmol/L (ref 3.5–5.1)
Sodium: 135 mmol/L (ref 135–145)
Total Bilirubin: 0.2 mg/dL — ABNORMAL LOW (ref 0.3–1.2)
Total Protein: 4.8 g/dL — ABNORMAL LOW (ref 6.5–8.1)

## 2022-01-11 LAB — PROCALCITONIN: Procalcitonin: <0.1 ng/mL

## 2022-01-11 LAB — MAGNESIUM: Magnesium: 1.8 mg/dL (ref 1.7–2.4)

## 2022-01-11 MED ORDER — AMLODIPINE BESYLATE 10 MG PO TABS
10.0000 mg | ORAL_TABLET | Freq: Every day | ORAL | Status: DC
  Administered 2022-01-12 – 2022-01-16 (×5): 10 mg via ORAL
  Filled 2022-01-11 (×5): qty 1

## 2022-01-11 MED ORDER — FUROSEMIDE 20 MG PO TABS
20.0000 mg | ORAL_TABLET | Freq: Every day | ORAL | Status: DC
  Administered 2022-01-11 – 2022-01-16 (×6): 20 mg via ORAL
  Filled 2022-01-11 (×6): qty 1

## 2022-01-11 MED ORDER — IPRATROPIUM-ALBUTEROL 0.5-2.5 (3) MG/3ML IN SOLN
3.0000 mL | Freq: Two times a day (BID) | RESPIRATORY_TRACT | Status: DC
  Administered 2022-01-11 – 2022-01-16 (×10): 3 mL via RESPIRATORY_TRACT
  Filled 2022-01-11 (×11): qty 3

## 2022-01-11 MED ORDER — INSULIN ASPART 100 UNIT/ML IJ SOLN
5.0000 [IU] | Freq: Three times a day (TID) | INTRAMUSCULAR | Status: DC
Start: 2022-01-11 — End: 2022-01-16
  Administered 2022-01-11 – 2022-01-15 (×13): 5 [IU] via SUBCUTANEOUS

## 2022-01-11 MED ORDER — PREDNISONE 20 MG PO TABS
40.0000 mg | ORAL_TABLET | Freq: Every day | ORAL | Status: AC
  Administered 2022-01-12 – 2022-01-14 (×3): 40 mg via ORAL
  Filled 2022-01-11 (×3): qty 2

## 2022-01-11 NOTE — Progress Notes (Signed)
Occupational Therapy Treatment Patient Details Name: Renee [name] MRN: [id number] DOB: 03-31-1929 Today's Date: 01/11/2022   History of present illness Renee [name] is a 88 y.o. female who presented with geenralized weakness ongoing for 3 days. Pt admitted for  severe sepsis due to community-acquired pneumonia . Pt with medical history significant for type 2 diabetes mellitus, mild intermittent asthma, essential pretension, allergic rhinitis, chronic diastolic heart failure.   OT comments  Pt greeted seated in recliner having just finished with PT. Session focus on increasing overall activity tolerance and BUE strength/endurance for higher level functional mobility tasks. Pt completed therex as indicated below with level 1 theraband. Pt on RA during session with SpO2 WFL. Pt would continue to benefit from skilled occupational therapy while admitted and after d/c to address the below listed limitations in order to improve overall functional mobility and facilitate independence with BADL participation. DC plan remains appropriate, will follow acutely per POC.       Recommendations for follow up therapy are one component of a multi-disciplinary discharge planning process, led by the attending physician.  Recommendations may be updated based on patient status, additional functional criteria and insurance authorization.    Follow Up Recommendations  Home health OT    Assistance Recommended at Discharge Frequent or constant Supervision/Assistance  Patient can return home with the following  A lot of help with walking and/or transfers;A lot of help with bathing/dressing/bathroom;Assistance with cooking/housework;Assistance with feeding;Direct supervision/assist for medications management;Direct supervision/assist for financial management;Help with stairs or ramp for entrance   Equipment Recommendations  Wheelchair (measurements OT);Wheelchair cushion (measurements OT)    Recommendations for  Other Services      Precautions / Restrictions Precautions Precautions: Fall Precaution Comments: watch O2 Restrictions Weight Bearing Restrictions: No       Mobility Bed Mobility               General bed mobility comments: OOB in recliner on arrival    Transfers                         Balance                                           ADL either performed or assessed with clinical judgement   ADL                                         General ADL Comments: session focus on increasing overall actiivty tolrance for ADL participation    Extremity/Trunk Assessment Upper Extremity Assessment Upper Extremity Assessment: Generalized weakness;LUE deficits/detail LUE Deficits / Details: grossly 3/5 mmt; weak from prior CVA LUE Sensation: WNL LUE Coordination: decreased fine motor;decreased gross motor   Lower Extremity Assessment Lower Extremity Assessment: Defer to PT evaluation        Vision Baseline Vision/History: 1 Wears glasses Patient Visual Report: No change from baseline     Perception Perception Perception: Within Functional Limits   Praxis Praxis Praxis: Intact    Cognition Arousal/Alertness: Awake/alert Behavior During Therapy: Flat affect   Area of Impairment: Safety/judgement, Problem solving, Attention, Following commands                   Current Attention Level: Sustained   Following Commands:  Follows one step commands with increased time, Follows multi-step commands inconsistently Safety/Judgement: Decreased awareness of deficits   Problem Solving: Slow processing, Requires verbal cues, Decreased initiation, Difficulty sequencing, Requires tactile cues General Comments: increased time for processing needing tactile cues at times to complete HEP        Exercises General Exercises - Upper Extremity Shoulder Flexion: Strengthening, Both, 10 reps, Seated, Theraband Theraband Level  (Shoulder Flexion): Level 1 (Yellow) Shoulder Horizontal ABduction: Strengthening, Both, 10 reps, Seated, Theraband Theraband Level (Shoulder Horizontal Abduction): Level 1 (Yellow) Shoulder Horizontal ADduction: Strengthening, Both, 10 reps, Seated, Theraband Theraband Level (Shoulder Horizontal Adduction): Level 1 (Yellow) Elbow Flexion: Strengthening, Both, 10 reps, Seated, Theraband Theraband Level (Elbow Flexion): Level 1 (Yellow) Elbow Extension: Strengthening, Both, 10 reps, Theraband Theraband Level (Elbow Extension): Level 1 (Yellow) Digit Composite Flexion: Strengthening, Left, 10 reps, Seated Composite Extension: Strengthening, Left, 10 reps, Seated    Shoulder Instructions       General Comments pt on RA SpO2>90% HR WFL, RR up to 36 with activity, caregiver present during session issued pt HEP    Pertinent Vitals/ Pain       Pain Assessment Pain Assessment: No/denies pain  Home Living                                          Prior Functioning/Environment              Frequency  Min 2X/week        Progress Toward Goals  OT Goals(current goals can now be found in the care plan section)  Progress towards OT goals: Progressing toward goals  Acute Rehab OT Goals Patient Stated Goal: to go home OT Goal Formulation: With patient Time For Goal Achievement: 01/20/22 Potential to Achieve Goals: Good  Plan Discharge plan remains appropriate;Frequency remains appropriate    Co-evaluation                 AM-PAC OT "6 Clicks" Daily Activity     Outcome Measure   Help from another person eating meals?: None Help from another person taking care of personal grooming?: A Little Help from another person toileting, which includes using toliet, bedpan, or urinal?: A Little Help from another person bathing (including washing, rinsing, drying)?: A Little Help from another person to put on and taking off regular upper body clothing?: A  Little Help from another person to put on and taking off regular lower body clothing?: A Little 6 Click Score: 19    End of Session    OT Visit Diagnosis: Other abnormalities of gait and mobility (R26.89);Unsteadiness on feet (R26.81);Muscle weakness (generalized) (M62.81)   Activity Tolerance Patient tolerated treatment well   Patient Left in chair;with call bell/phone within reach;with chair alarm set;with family/visitor present   Nurse Communication Mobility status;Other (comment) (needs new purewick)        Time: 1536-1550 OT Time Calculation (min): 14 min  Charges: OT General Charges $OT Visit: 1 Visit OT Treatments $Therapeutic Exercise: 8-22 mins  Harley Alto., COTA/L Acute Rehabilitation Services [phone]   Precious Haws 01/11/2022, 4:15 PM

## 2022-01-11 NOTE — Progress Notes (Signed)
Inpatient Diabetes Program Recommendations  AACE/ADA: New Consensus Statement on Inpatient Glycemic Control (2015)  Target Ranges:  Prepandial:   less than 140 mg/dL      Peak postprandial:   less than 180 mg/dL (1-2 hours)      Critically ill patients:  140 - 180 mg/dL   Lab Results  Component Value Date   GLUCAP 125 (H) 01/11/2022   HGBA1C 6.8 (H) 12/25/2021    Review of Glycemic Control    Diabetes history: DM2 Outpatient Diabetes medications: Glipizide 5 mg daily, Januvia 100 mg daily, Actos 30 mg daily Current orders for Inpatient glycemic control: Semglee 8 units daily, Novolog 0-20 units TID with meals, Novolog 0-5 units QHS; Solumedrol 40 mg BID   Inpatient Diabetes Program Recommendations:  Please consider while on steroids:  Novolog 5 units TID meal coverage (hold if patient NPO or eats at least 50% of meals) Secure chat sent to Dr. Grandville Silos.  Thank you, Nani Gasser. Tamarcus Condie, RN, MSN, CDE  Diabetes Coordinator Inpatient Glycemic Control Team Team Pager [phone] (8am-5pm) 01/11/2022 11:19 AM

## 2022-01-11 NOTE — Progress Notes (Signed)
Physical Therapy Treatment Patient Details Name: Renee [name] MRN: [id number] DOB: 12/20/1928 Today's Date: 01/11/2022   History of Present Illness Renee [name] is a 88 y.o. female who presented with geenralized weakness ongoing for 3 days. Pt admitted for  severe sepsis due to community-acquired pneumonia . Pt with medical history significant for type 2 diabetes mellitus, mild intermittent asthma, essential pretension, allergic rhinitis, chronic diastolic heart failure.    PT Comments    Pt received OOB in recliner on arrival and agreeable to session with steady progress towards acute goals, however pt continues to be limited by fatigue. Pt able to demonstrate 2 bouts of ambulation with min guard and light assist for cues with extended seated rest break needed between bouts. Pt with fair tolerance for LE therex for increased ROM and strength. O2 sats maintained >92% on RA throughout session. Pt continues to benefit from skilled PT services to progress toward functional mobility goals.      Recommendations for follow up therapy are one component of a multi-disciplinary discharge planning process, led by the attending physician.  Recommendations may be updated based on patient status, additional functional criteria and insurance authorization.  Follow Up Recommendations  Home health PT     Assistance Recommended at Discharge Frequent or constant Supervision/Assistance  Patient can return home with the following A little help with walking and/or transfers;A little help with bathing/dressing/bathroom;Assistance with cooking/housework;Assist for transportation;Help with stairs or ramp for entrance   Equipment Recommendations  None recommended by PT    Recommendations for Other Services       Precautions / Restrictions Precautions Precautions: Fall Precaution Comments: watch O2 Restrictions Weight Bearing Restrictions: No     Mobility  Bed Mobility Overal bed mobility: Needs  Assistance             General bed mobility comments: OOB in recliner on arrival    Transfers Overall transfer level: Needs assistance Equipment used: Rolling walker (2 wheels) Transfers: Sit to/from Stand Sit to Stand: Min assist, Mod assist           General transfer comment: mod a to power up from low recliner    Ambulation/Gait Ambulation/Gait assistance: Min guard Gait Distance (Feet): 80 Feet (40' x2 with extended seated rest break between) Assistive device: Rolling walker (2 wheels) Gait Pattern/deviations: Step-to pattern, Decreased step length - right, Decreased step length - left, Shuffle, Decreased stride length, Trunk flexed Gait velocity: decreased     General Gait Details: B LE weakness, min guard, cues for staying close to walker and upright posture.   Stairs             Wheelchair Mobility    Modified Rankin (Stroke Patients Only)       Balance Overall balance assessment: Needs assistance Sitting-balance support: Feet supported Sitting balance-Leahy Scale: Fair     Standing balance support: Bilateral upper extremity supported, During functional activity, Reliant on assistive device for balance Standing balance-Leahy Scale: Poor                              Cognition Arousal/Alertness: Awake/alert Behavior During Therapy: Flat affect Overall Cognitive Status: Impaired/Different from baseline Area of Impairment: Safety/judgement, Problem solving                   Current Attention Level: Sustained   Following Commands: Follows one step commands with increased time     Problem Solving: Slow processing, Requires  verbal cues General Comments: incr time and cues required for sequencing; delayed responses likely due to [place] Exercises - Lower Extremity Ankle Circles/Pumps: Both, 10 reps, Seated Long Arc Quad: Both, 20 reps, Seated Hip Flexion/Marching: Both, 20 reps, Seated    General  Comments General comments (skin integrity, edema, etc.): O2 >92% on RA throughout, HR max 138bpm during ambulation, 85bpm at rest      Pertinent Vitals/Pain      Home Living                          Prior Function            PT Goals (current goals can now be found in the care plan section) Acute Rehab PT Goals Patient Stated Goal: to go home PT Goal Formulation: With patient Time For Goal Achievement: 01/20/22    Frequency    Min 3X/week      PT Plan Current plan remains appropriate    Co-evaluation              AM-PAC PT "6 Clicks" Mobility   Outcome Measure  Help needed turning from your back to your side while in a flat bed without using bedrails?: A Little Help needed moving from lying on your back to sitting on the side of a flat bed without using bedrails?: A Little Help needed moving to and from a bed to a chair (including a wheelchair)?: A Little Help needed standing up from a chair using your arms (e.g., wheelchair or bedside chair)?: A Little Help needed to walk in hospital room?: A Little Help needed climbing 3-5 steps with a railing? : A Lot 6 Click Score: 17    End of Session Equipment Utilized During Treatment: Gait belt Activity Tolerance: Patient limited by fatigue Patient left: in chair;with call bell/phone within reach;with family/visitor present;Other (comment) (with OTA present) Nurse Communication: Mobility status PT Visit Diagnosis: Unsteadiness on feet (R26.81);Muscle weakness (generalized) (M62.81);Difficulty in walking, not elsewhere classified (R26.2)     Time: 5597-4163 PT Time Calculation (min) (ACUTE ONLY): 24 min  Charges:  $Therapeutic Exercise: 23-37 mins                     Collan Schoenfeld R. PTA Acute Rehabilitation Services Office: [place] 01/11/2022, 3:55 PM

## 2022-01-11 NOTE — Progress Notes (Signed)
PROGRESS NOTE    Renee [name]  XVQ:[id number] DOB: 1929/07/18 DOA: 01/05/2022 PCP: [name], MD    Chief Complaint  Patient presents with   Pneumonia   UTI    Brief Narrative:  PMH of type II DM, HTN, chronic HFpEF, OSA, obesity.  Presents with complaints of shortness of breath and cough found to have pneumonia as well as possible mild exacerbation of asthma.    Assessment & Plan:  Principal Problem:   Sepsis due to pneumonia [facility]) Active Problems:   CAP (community acquired pneumonia)   Acute respiratory failure with hypoxia ([place])   Acute renal failure superimposed on stage 3b chronic kidney disease (HCC)   Asthma, chronic, unspecified asthma severity, with acute exacerbation   Chronic diastolic CHF (congestive heart failure) (HCC)   Type 2 diabetes, controlled, with retinopathy ([place])   Essential hypertension   Anxiety and depression   History of CVA (cerebrovascular accident)   Generalized weakness   Allergic rhinitis   Obesity (BMI 30-39.9)    Assessment and Plan: * Sepsis due to pneumonia ([place]) Acute hypoxic respiratory failure. Mild asthma exacerbation/Pneumonia With fever, tachypnea and elevated lactic acid meets SIRS criteria.  Chest x-ray showing evidence of pneumonia with elevated procalcitonin minutes criteria for infection. O2 saturation 88% on room air with tachypnea on admission.  Currently 97% on 2 L nasal cannula. COVID-19 PCR negative, influenza A and B PCR negative. Blood cultures so far negative. -Patient with improvement with wheezing still with some rhonchorous breath sounds. -Transition from IV steroids to oral prednisone taper. -Continue IV antibiotics to complete a 7-day course today and discontinue IV antibiotics after today's doses, -Continue scheduled DuoNebs, Pulmicort, Claritin, PPI, Flonase, Mucinex.. -Continue flutter valve, incentive spirometry. -Chest PT.  CAP (community acquired pneumonia) - See above.  Acute renal  failure superimposed on stage 3b chronic kidney disease (HCC) Baseline serum creatinine around 1.3.  GFR more than 30 prior to admission. Serum creatinine on admission 1.8, worsened to 2.2 with improvement with IV hydration.  -Patient placed on IV Lasix creatinine down to 1.40 today.  -Urine output of 1.2 L over the past 24 hours.  -Place on Lasix 20 mg orally daily. -Follow.  Chronic diastolic CHF (congestive heart failure) (HCC) Moderate pericardial effusion. Mildly volume overloaded early on during the hospitalization.  Chest x-ray showed evidence of vascular congestion. Echocardiogram shows preserved EF. Also shows pericardial effusion moderate without any tamponade. -Patient has been receiving IV Lasix and will transition to oral Lasix 20 mg daily today. -May need to increase home regimen Lasix from 10 mg to 20 mg daily on discharge.  Obesity (BMI 30-39.9) OSA Not on CPAP  Body mass index is 31.52 kg/m.  Placing the pt at higher risk of poor outcomes.  Allergic rhinitis - Claritin, Flonase.  History of CVA (cerebrovascular accident) With left hemiparesis. Exam unchanged. -Continue aspirin, Monitor.  PT OT recommends home with home health.  Anxiety and depression Continue home regimen Zoloft and Xanax.  Essential hypertension -BP noted to be elevated patient started back on half home dose of Norvasc 5 mg daily as well as hydralazine 25 mg 3 times daily.   -Increase Norvasc to 10 mg daily.   -Patient on Lasix 20 mg daily. -Follow.  Type 2 diabetes, controlled, with retinopathy ([place]) Uncontrolled with hyperglycemia due to steroids. Hemoglobin A1c 6.8 roughly 2 weeks ago. Not on any insulin at home. -Elevated CBGs in light secondary to steroids. -Steroids being tapered. -Continue Semglee 10 units daily, SSI,  NovoLog 5 units 3 times daily with meals, SSI.         DVT prophylaxis: SCDs Code Status: DNR Family Communication: Updated patient and caretaker at  bedside. Disposition: Likely home when clinically improved.  Status is: Inpatient Remains inpatient appropriate because: Severity of illness   Consultants:  None  Procedures:  Chest x-ray 01/05/2022, 01/08/2022 2D echo 01/06/2022   Antimicrobials:  Review azithromycin 01/05/2022>>> 01/06/2022 IV Rocephin 01/05/2022>>>>> 01/11/2022   Subjective: Sitting up in bed.  States does not feel any significant change with her illness however does endorse improvement with wheezing.  Caregiver at bedside.  Good oral intake per caregiver.  No chest pain.  No significant shortness of breath.  Still with congestion.  Objective: Vitals:   01/11/22 1012 01/11/22 1206 01/11/22 1232 01/11/22 1400  BP:  (!) 163/59  (!) 158/69  Pulse:  77 87 83  Resp:  19 (!) 24 20  Temp:  98.2 F (36.8 C)    TempSrc:  Oral    SpO2: 93% 95% 94% 94%  Weight:      Height:        Intake/Output Summary (Last 24 hours) at 01/11/2022 1517 Last data filed at 01/11/2022 0855 Gross per 24 hour  Intake 580 ml  Output 1200 ml  Net -620 ml   Filed Weights   01/05/22 2239 01/09/22 0500 01/11/22 0500  Weight: 81 kg 80.7 kg 81.6 kg    Examination:  General exam: Appears calm and comfortable  Respiratory system: Minimal expiratory wheezing.  Some scattered coarse rhonchorous breath sounds.  Fair air movement.  Speaking in full sentences.  Cardiovascular system: Regular rate rhythm no murmurs rubs or gallops.  No JVD.  No lower extremity edema. Gastrointestinal system: Abdomen is nondistended, soft and nontender. No organomegaly or masses felt. Normal bowel sounds heard. Central nervous system: Alert and oriented. No focal neurological deficits. Extremities: Symmetric 5 x 5 power. Skin: No rashes, lesions or ulcers Psychiatry: Judgement and insight appear normal. Mood & affect appropriate.     Data Reviewed:   CBC: Recent Labs  Lab 01/05/22 1854 01/06/22 0223 01/07/22 0122 01/08/22 0411 01/09/22 0117  01/10/22 0349 01/11/22 0258  WBC 9.9 7.7 7.5 5.2 4.5 6.4 9.2  NEUTROABS 8.8* 7.1  --   --   --   --   --   HGB 10.6* 9.8* 9.1* 8.7* 9.5* 9.9* 9.8*  HCT 34.1* 30.6* 29.8* 28.0* 30.3* 30.7* 31.0*  MCV 90.2 89.2 91.1 90.9 90.7 88.0 88.3  PLT 135* 120* 128* 125* 130* 158 993    Basic Metabolic Panel: Recent Labs  Lab 01/06/22 0223 01/07/22 0122 01/07/22 2152 01/08/22 0411 01/09/22 0117 01/10/22 0349 01/11/22 0258  NA 140 141  --  139 136 135 135  K 4.1 4.4  --  4.1 4.8 5.1 4.1  CL 105 108  --  106 103 101 101  CO2 25 26  --  '26 25 29 28  '$ GLUCOSE 333* 173* 448* 188* 341* 259* 72  BUN 36* 40*  --  37* 39* 36* 45*  CREATININE 2.12* 1.78*  --  1.60* 1.48* 1.36* 1.40*  CALCIUM 8.6* 8.5*  --  8.2* 8.3* 8.4* 8.5*  MG 1.8 2.2  --  2.1 1.9 1.9 1.8  PHOS 3.4  --   --   --   --   --   --     GFR: Estimated Creatinine Clearance: 27 mL/min (A) (by C-G formula based on SCr of 1.4 mg/dL (H)).  Liver Function Tests: Recent Labs  Lab 01/05/22 1854 01/06/22 0223 01/10/22 0349 01/11/22 0258  AST '23 21 16 21  '$ ALT '17 17 16 25  '$ ALKPHOS 72 63 54 54  BILITOT 0.6 0.5 0.3 0.2*  PROT 5.8* 5.3* 4.9* 4.8*  ALBUMIN 3.3* 2.9* 2.5* 2.7*    CBG: Recent Labs  Lab 01/10/22 1154 01/10/22 1646 01/10/22 2017 01/11/22 0741 01/11/22 1204  GLUCAP 305* 371* 244* 125* 250*     Recent Results (from the past 240 hour(s))  Resp Panel by RT-PCR (Flu A&B, Covid) Nasopharyngeal Swab     Status: None   Collection Time: 01/05/22  6:55 PM   Specimen: Nasopharyngeal Swab; Nasopharyngeal(NP) swabs in vial transport medium  Result Value Ref Range Status   SARS Coronavirus 2 by RT PCR NEGATIVE NEGATIVE Final    Comment: (NOTE) SARS-CoV-2 target nucleic acids are NOT DETECTED.  The SARS-CoV-2 RNA is generally detectable in upper respiratory specimens during the acute phase of infection. The lowest concentration of SARS-CoV-2 viral copies this assay can detect is 138 copies/mL. A negative result does  not preclude SARS-Cov-2 infection and should not be used as the sole basis for treatment or other patient management decisions. A negative result may occur with  improper specimen collection/handling, submission of specimen other than nasopharyngeal swab, presence of viral mutation(s) within the areas targeted by this assay, and inadequate number of viral copies(<138 copies/mL). A negative result must be combined with clinical observations, patient history, and epidemiological information. The expected result is Negative.  Fact Sheet for Patients:  EntrepreneurPulse.com.au  Fact Sheet for Healthcare Providers:  IncredibleEmployment.be  This test is no t yet approved or cleared by the [place] FDA and  has been authorized for detection and/or diagnosis of SARS-CoV-2 by FDA under an Emergency Use Authorization (EUA). This EUA will remain  in effect (meaning this test can be used) for the duration of the COVID-19 declaration under Section 564(b)(1) of the Act, 21 U.S.C.section 360bbb-3(b)(1), unless the authorization is terminated  or revoked sooner.       Influenza A by PCR NEGATIVE NEGATIVE Final   Influenza B by PCR NEGATIVE NEGATIVE Final    Comment: (NOTE) The Xpert Xpress SARS-CoV-2/FLU/RSV plus assay is intended as an aid in the diagnosis of influenza from Nasopharyngeal swab specimens and should not be used as a sole basis for treatment. Nasal washings and aspirates are unacceptable for Xpert Xpress SARS-CoV-2/FLU/RSV testing.  Fact Sheet for Patients: EntrepreneurPulse.com.au  Fact Sheet for Healthcare Providers: IncredibleEmployment.be  This test is not yet approved or cleared by the [place] FDA and has been authorized for detection and/or diagnosis of SARS-CoV-2 by FDA under an Emergency Use Authorization (EUA). This EUA will remain in effect (meaning this test can be used) for the  duration of the COVID-19 declaration under Section 564(b)(1) of the Act, 21 U.S.C. section 360bbb-3(b)(1), unless the authorization is terminated or revoked.  Performed at [facility], [place] [address] [place], [place] [address]   Blood culture (routine x 2)     Status: None   Collection Time: 01/05/22  7:00 PM   Specimen: BLOOD  Result Value Ref Range Status   Specimen Description BLOOD RIGHT ANTECUBITAL  Final   Special Requests   Final    BOTTLES DRAWN AEROBIC AND ANAEROBIC Blood Culture adequate volume   Culture   Final    NO GROWTH 5 DAYS Performed at [facility], [place] [address], [place], [place] [address]  Report Status 01/10/2022 FINAL  Final  Blood culture (routine x 2)     Status: None   Collection Time: 01/05/22  7:43 PM   Specimen: BLOOD  Result Value Ref Range Status   Specimen Description BLOOD BLOOD RIGHT FOREARM  Final   Special Requests   Final    BOTTLES DRAWN AEROBIC AND ANAEROBIC Blood Culture adequate volume   Culture   Final    NO GROWTH 5 DAYS Performed at [facility], [address] [place], [place] [address]    Report Status 01/10/2022 FINAL  Final         Radiology Studies: No results found.      Scheduled Meds:  amLODipine  5 mg Oral Daily   aspirin EC  325 mg Oral Daily   benzonatate  100 mg Oral TID   budesonide (PULMICORT) nebulizer solution  0.5 mg Nebulization BID   darifenacin  15 mg Oral Daily   dextromethorphan-guaiFENesin  1 tablet Oral BID   docusate sodium  200 mg Oral QHS   fluticasone  2 spray Each Nare QHS   furosemide  20 mg Oral Daily   hydrALAZINE  25 mg Oral Q8H   insulin aspart  0-20 Units Subcutaneous TID WC   insulin aspart  0-5 Units Subcutaneous QHS   insulin aspart  5 Units Subcutaneous TID WC   insulin glargine-yfgn  10 Units Subcutaneous Daily   ipratropium-albuterol  3 mL Nebulization BID   loratadine  10 mg Oral Daily   mouth rinse  15 mL Mouth Rinse BID   melatonin  3 mg Oral  QHS   pantoprazole  40 mg Oral Q0600   [START ON 01/12/2022] predniSONE  40 mg Oral QAC breakfast   sertraline  50 mg Oral Daily   vitamin B-12  1,000 mcg Oral Daily   Continuous Infusions:  cefTRIAXone (ROCEPHIN)  IV Stopped (01/10/22 2308)     LOS: 6 days    Time spent: 35-minute    Irine Seal, MD [facility]   To contact the attending provider between 7A-7P or the covering provider during after hours 7P-7A, please log into the web site [email or web address] and access using universal [place] password for that web site. If you do not have the password, please call the hospital operator.  01/11/2022, 3:17 PM

## 2022-01-11 NOTE — Progress Notes (Signed)
Patient at 1000 placed on room air while lying in bed.  1012 oxygen saturation was 85% on the room air. She was placed back on 2 L [place] with increase in oxygen saturation to 95%.

## 2022-01-12 ENCOUNTER — Telehealth: Payer: Self-pay | Admitting: Pulmonary Disease

## 2022-01-12 DIAGNOSIS — J189 Pneumonia, unspecified organism: Secondary | ICD-10-CM | POA: Diagnosis not present

## 2022-01-12 DIAGNOSIS — J301 Allergic rhinitis due to pollen: Secondary | ICD-10-CM | POA: Diagnosis not present

## 2022-01-12 DIAGNOSIS — N179 Acute kidney failure, unspecified: Secondary | ICD-10-CM | POA: Diagnosis not present

## 2022-01-12 DIAGNOSIS — J9601 Acute respiratory failure with hypoxia: Secondary | ICD-10-CM | POA: Diagnosis not present

## 2022-01-12 LAB — GLUCOSE, CAPILLARY
Glucose-Capillary: 133 mg/dL — ABNORMAL HIGH (ref 70–99)
Glucose-Capillary: 202 mg/dL — ABNORMAL HIGH (ref 70–99)
Glucose-Capillary: 206 mg/dL — ABNORMAL HIGH (ref 70–99)
Glucose-Capillary: 206 mg/dL — ABNORMAL HIGH (ref 70–99)

## 2022-01-12 LAB — COMPREHENSIVE METABOLIC PANEL
ALT: 23 U/L (ref 0–44)
AST: 17 U/L (ref 15–41)
Albumin: 2.6 g/dL — ABNORMAL LOW (ref 3.5–5.0)
Alkaline Phosphatase: 50 U/L (ref 38–126)
Anion gap: 9 (ref 5–15)
BUN: 37 mg/dL — ABNORMAL HIGH (ref 8–23)
CO2: 27 mmol/L (ref 22–32)
Calcium: 8.4 mg/dL — ABNORMAL LOW (ref 8.9–10.3)
Chloride: 103 mmol/L (ref 98–111)
Creatinine, Ser: 1.31 mg/dL — ABNORMAL HIGH (ref 0.44–1.00)
GFR, Estimated: 39 mL/min — ABNORMAL LOW (ref >60)
Glucose, Bld: 153 mg/dL — ABNORMAL HIGH (ref 70–99)
Potassium: 4.1 mmol/L (ref 3.5–5.1)
Sodium: 139 mmol/L (ref 135–145)
Total Bilirubin: 0.5 mg/dL (ref 0.3–1.2)
Total Protein: 4.6 g/dL — ABNORMAL LOW (ref 6.5–8.1)

## 2022-01-12 LAB — CBC
HCT: 30.6 % — ABNORMAL LOW (ref 36.0–46.0)
Hemoglobin: 9.7 g/dL — ABNORMAL LOW (ref 12.0–15.0)
MCH: 28.5 pg (ref 26.0–34.0)
MCHC: 31.7 g/dL (ref 30.0–36.0)
MCV: 90 fL (ref 80.0–100.0)
Platelets: 172 10*3/uL (ref 150–400)
RBC: 3.4 MIL/uL — ABNORMAL LOW (ref 3.87–5.11)
RDW: 14.7 % (ref 11.5–15.5)
WBC: 6.9 10*3/uL (ref 4.0–10.5)
nRBC: 0 % (ref 0.0–0.2)

## 2022-01-12 LAB — MAGNESIUM: Magnesium: 1.7 mg/dL (ref 1.7–2.4)

## 2022-01-12 MED ORDER — FUROSEMIDE 10 MG/ML IJ SOLN
20.0000 mg | Freq: Once | INTRAMUSCULAR | Status: AC
  Administered 2022-01-12: 20 mg via INTRAVENOUS
  Filled 2022-01-12: qty 2

## 2022-01-12 NOTE — Progress Notes (Signed)
Mobility Specialist Progress Note:   01/12/22 1015  Mobility  Activity Ambulated with assistance in hallway  Level of Assistance Minimal assist, patient does 75% or more  Assistive Device Front wheel walker  Distance Ambulated (ft) 60 ft  Activity Response Tolerated well  $Mobility charge 1 Mobility   Pt received in bed willing to participate in mobility. No complaints of pain. Pt required 1 seated break d/t feeling weak and stated she felt "hot". Left in chair with call bell in reach and all needs met.   [facility] Air traffic controller or  Office phone [phone]

## 2022-01-12 NOTE — Telephone Encounter (Signed)
Pt is currently admitted in hospital. They are about to discharge her. Her 02 levels are above 90%, but they want her to have a CPAP as she needs 02 at night. PT has thrown away CPAP. Requesting someone from pulmonary dept come eval her.

## 2022-01-12 NOTE — Progress Notes (Signed)
Pt. Unable to use cpap due to her medical condition at this time.

## 2022-01-12 NOTE — Telephone Encounter (Signed)
ATC patient. Line rang once with no answer.

## 2022-01-12 NOTE — Progress Notes (Signed)
SATURATION QUALIFICATIONS: (This note is used to comply with regulatory documentation for home oxygen)  Patient Saturations on Room Air at Rest = 99%  Patient Saturations on Room Air while Ambulating = 95%  Patient Saturations on n/a Liters of oxygen while Ambulating = n/a%

## 2022-01-12 NOTE — Progress Notes (Signed)
   01/12/22 2249  BiPAP/CPAP/SIPAP  $ Non-Invasive Ventilator  Non-Invasive Vent Set Up  $ Non-Invasive Home Ventilator  Initial  BiPAP/CPAP/SIPAP Pt Type Adult  Mask Type Full face mask  Mask Size Medium  Respiratory Rate 19 breaths/min  EPAP  (auto 16 min 6)  Oxygen Percent 21 %  BiPAP/CPAP/SIPAP CPAP  Patient Home Equipment No  Auto Titrate No  BiPAP/CPAP /SiPAP Vitals  Pulse Rate 78  Resp 19  SpO2 100 %  MEWS Score/Color  MEWS Score 0  MEWS Score Color Green   Rt called RN and pt. Stated pt. Is able to use cpap at this time RT placed pt. On above cpap settings.

## 2022-01-12 NOTE — Progress Notes (Signed)
PROGRESS NOTE    Renee [name]  NID:[id number] DOB: 04/22/1929 DOA: 01/05/2022 PCP: [name], MD    Chief Complaint  Patient presents with   Pneumonia   UTI    Brief Narrative:  PMH of type II DM, HTN, chronic HFpEF, OSA, obesity.  Presents with complaints of shortness of breath and cough found to have pneumonia as well as possible mild exacerbation of asthma.    Assessment & Plan:  Principal Problem:   Sepsis due to pneumonia [facility]) Active Problems:   CAP (community acquired pneumonia)   Acute respiratory failure with hypoxia ([place])   Acute renal failure superimposed on stage 3b chronic kidney disease (HCC)   Asthma, chronic, unspecified asthma severity, with acute exacerbation   Chronic diastolic CHF (congestive heart failure) (HCC)   Type 2 diabetes, controlled, with retinopathy ([place])   Essential hypertension   Anxiety and depression   History of CVA (cerebrovascular accident)   Generalized weakness   Allergic rhinitis   Obesity (BMI 30-39.9)    Assessment and Plan: * Sepsis due to pneumonia ([place]) Acute hypoxic respiratory failure. Mild asthma exacerbation/Pneumonia With fever, tachypnea and elevated lactic acid meets SIRS criteria.  Chest x-ray showing evidence of pneumonia with elevated procalcitonin meeting criteria for infection. O2 saturation 88% on room air with tachypnea on admission.  Currently sats of 96% on room air when sitting up.  COVID-19 PCR negative, influenza A and B PCR negative. Blood cultures so far negative. -Patient with improvement with wheezing still with some rhonchorous breath sounds. -Transitioned from IV steroids to oral prednisone taper. -Status post 7 days IV antibiotics.   -Continue scheduled DuoNebs, Pulmicort, Claritin, PPI, Flonase, Mucinex.. -Continue flutter valve, incentive spirometry. -We will give a dose of Lasix 20 mg IV x1. -Chest PT.  CAP (community acquired pneumonia) - See above.  Acute renal failure  superimposed on stage 3b chronic kidney disease (HCC) Baseline serum creatinine around 1.3.  GFR more than 30 prior to admission. Serum creatinine on admission 1.8, worsened to 2.2 with improvement with IV hydration.  -Patient placed on IV Lasix creatinine down to 1.31 today.  -Urine output of 1.3 L over the past 24 hours.  -Give a dose of Lasix 20 mg IV x1.   -Continue Lasix 20 mg daily.   -Follow.  Chronic diastolic CHF (congestive heart failure) (HCC) Moderate pericardial effusion. Mildly volume overloaded early on during the hospitalization.  Chest x-ray showed evidence of vascular congestion. Echocardiogram shows preserved EF. Also shows pericardial effusion moderate without any tamponade. -Patient has been receiving IV Lasix and has been transitioned to Lasix 20 mg daily.  -We will give a dose of Lasix 20 mg IV x1 today. -May need to increase home regimen Lasix from 10 mg to 20 mg daily on discharge.  Obesity (BMI 30-39.9) OSA Not on CPAP  Body mass index is 31.52 kg/m.  Placing the pt at higher risk of poor outcomes.  Allergic rhinitis - Claritin, Flonase.  History of CVA (cerebrovascular accident) With left hemiparesis. Exam unchanged. -Continue aspirin, Monitor.  PT OT recommends home with home health.  Anxiety and depression Continue home regimen Zoloft and Xanax.  Essential hypertension -BP noted to be elevated patient started back on half home dose of Norvasc 5 mg daily as well as hydralazine 25 mg 3 times daily.   -Norvasc increased to 10 mg daily for better blood pressure control.   -Continue Lasix 20 mg daily.   -Follow.  Type 2 diabetes, controlled, with retinopathy (  [place]) Uncontrolled with hyperglycemia due to steroids. Hemoglobin A1c 6.8 roughly 2 weeks ago. Not on any insulin at home. -Elevated CBGs in light secondary to steroids. -Steroids being tapered.  -CBG of 133 this morning. -Continue Semglee 10 units daily, SSI, NovoLog 5 units 3 times daily  with meals, SSI.         DVT prophylaxis: SCDs Code Status: DNR Family Communication: Updated patient and caretaker at bedside. Disposition: Likely home when clinically improved.  Status is: Inpatient Remains inpatient appropriate because: Severity of illness   Consultants:  None  Procedures:  Chest x-ray 01/05/2022, 01/08/2022 2D echo 01/06/2022   Antimicrobials:  Review azithromycin 01/05/2022>>> 01/06/2022 IV Rocephin 01/05/2022>>>>> 01/11/2022   Subjective: Patient sitting up in chair.  Overall feeling better.  Noted to desat at night into the 80s when sleeping per RN.  Patient with prior history of OSA not on his CPAP machine over the past 5 years per patient.  No chest pain.  No abdominal pain.  Tolerating oral intake.  Caretaker at bedside.  Objective: Vitals:   01/12/22 0739 01/12/22 0815 01/12/22 0819 01/12/22 1305  BP: (!) 167/63   (!) 145/49  Pulse: 64 66    Resp: 18 20    Temp: 98.3 F (36.8 C)   98 F (36.7 C)  TempSrc: Oral   Oral  SpO2: 92% 96% 96%   Weight:      Height:        Intake/Output Summary (Last 24 hours) at 01/12/2022 1857 Last data filed at 01/12/2022 1136 Gross per 24 hour  Intake 100 ml  Output 1050 ml  Net -950 ml   Filed Weights   01/09/22 0500 01/11/22 0500 01/12/22 0500  Weight: 80.7 kg 81.6 kg 80.9 kg    Examination:  General exam: Appears calm and comfortable  Respiratory system: Bibasilar coarse rhonchorous breath sounds.  No significant wheezing noted.  Fair air movement.  Speaking in full sentences.  Cardiovascular system: RRR no murmurs rubs or gallops.  No JVD.  No lower extremity edema.  Gastrointestinal system: Abdomen is nondistended, soft and nontender. No organomegaly or masses felt. Normal bowel sounds heard. Central nervous system: Alert and oriented. No focal neurological deficits. Extremities: Symmetric 5 x 5 power. Skin: No rashes, lesions or ulcers Psychiatry: Judgement and insight appear normal. Mood &  affect appropriate.     Data Reviewed:   CBC: Recent Labs  Lab 01/06/22 0223 01/07/22 0122 01/08/22 0411 01/09/22 0117 01/10/22 0349 01/11/22 0258 01/12/22 0626  WBC 7.7   < > 5.2 4.5 6.4 9.2 6.9  NEUTROABS 7.1  --   --   --   --   --   --   HGB 9.8*   < > 8.7* 9.5* 9.9* 9.8* 9.7*  HCT 30.6*   < > 28.0* 30.3* 30.7* 31.0* 30.6*  MCV 89.2   < > 90.9 90.7 88.0 88.3 90.0  PLT 120*   < > 125* 130* 158 187 172   < > = values in this interval not displayed.    Basic Metabolic Panel: Recent Labs  Lab 01/06/22 0223 01/07/22 0122 01/08/22 0411 01/09/22 0117 01/10/22 0349 01/11/22 0258 01/12/22 0626  NA 140   < > 139 136 135 135 139  K 4.1   < > 4.1 4.8 5.1 4.1 4.1  CL 105   < > 106 103 101 101 103  CO2 25   < > '26 25 29 28 27  '$ GLUCOSE 333*   < > 188*  341* 259* 72 153*  BUN 36*   < > 37* 39* 36* 45* 37*  CREATININE 2.12*   < > 1.60* 1.48* 1.36* 1.40* 1.31*  CALCIUM 8.6*   < > 8.2* 8.3* 8.4* 8.5* 8.4*  MG 1.8   < > 2.1 1.9 1.9 1.8 1.7  PHOS 3.4  --   --   --   --   --   --    < > = values in this interval not displayed.    GFR: Estimated Creatinine Clearance: 28.7 mL/min (A) (by C-G formula based on SCr of 1.31 mg/dL (H)).  Liver Function Tests: Recent Labs  Lab 01/06/22 0223 01/10/22 0349 01/11/22 0258 01/12/22 0626  AST '21 16 21 17  '$ ALT '17 16 25 23  '$ ALKPHOS 63 54 54 50  BILITOT 0.5 0.3 0.2* 0.5  PROT 5.3* 4.9* 4.8* 4.6*  ALBUMIN 2.9* 2.5* 2.7* 2.6*    CBG: Recent Labs  Lab 01/11/22 1628 01/11/22 1916 01/12/22 0735 01/12/22 1133 01/12/22 1604  GLUCAP 223* 195* 133* 202* 206*     Recent Results (from the past 240 hour(s))  Resp Panel by RT-PCR (Flu A&B, Covid) Nasopharyngeal Swab     Status: None   Collection Time: 01/05/22  6:55 PM   Specimen: Nasopharyngeal Swab; Nasopharyngeal(NP) swabs in vial transport medium  Result Value Ref Range Status   SARS Coronavirus 2 by RT PCR NEGATIVE NEGATIVE Final    Comment: (NOTE) SARS-CoV-2 target nucleic  acids are NOT DETECTED.  The SARS-CoV-2 RNA is generally detectable in upper respiratory specimens during the acute phase of infection. The lowest concentration of SARS-CoV-2 viral copies this assay can detect is 138 copies/mL. A negative result does not preclude SARS-Cov-2 infection and should not be used as the sole basis for treatment or other patient management decisions. A negative result may occur with  improper specimen collection/handling, submission of specimen other than nasopharyngeal swab, presence of viral mutation(s) within the areas targeted by this assay, and inadequate number of viral copies(<138 copies/mL). A negative result must be combined with clinical observations, patient history, and epidemiological information. The expected result is Negative.  Fact Sheet for Patients:  EntrepreneurPulse.com.au  Fact Sheet for Healthcare Providers:  IncredibleEmployment.be  This test is no t yet approved or cleared by the [place] FDA and  has been authorized for detection and/or diagnosis of SARS-CoV-2 by FDA under an Emergency Use Authorization (EUA). This EUA will remain  in effect (meaning this test can be used) for the duration of the COVID-19 declaration under Section 564(b)(1) of the Act, 21 U.S.C.section 360bbb-3(b)(1), unless the authorization is terminated  or revoked sooner.       Influenza A by PCR NEGATIVE NEGATIVE Final   Influenza B by PCR NEGATIVE NEGATIVE Final    Comment: (NOTE) The Xpert Xpress SARS-CoV-2/FLU/RSV plus assay is intended as an aid in the diagnosis of influenza from Nasopharyngeal swab specimens and should not be used as a sole basis for treatment. Nasal washings and aspirates are unacceptable for Xpert Xpress SARS-CoV-2/FLU/RSV testing.  Fact Sheet for Patients: EntrepreneurPulse.com.au  Fact Sheet for Healthcare Providers: IncredibleEmployment.be  This  test is not yet approved or cleared by the [place] FDA and has been authorized for detection and/or diagnosis of SARS-CoV-2 by FDA under an Emergency Use Authorization (EUA). This EUA will remain in effect (meaning this test can be used) for the duration of the COVID-19 declaration under Section 564(b)(1) of the Act, 21 U.S.C. section 360bbb-3(b)(1), unless the authorization  is terminated or revoked.  Performed at [facility], [place] [address], [place], [place] [address]   Blood culture (routine x 2)     Status: None   Collection Time: 01/05/22  7:00 PM   Specimen: BLOOD  Result Value Ref Range Status   Specimen Description BLOOD RIGHT ANTECUBITAL  Final   Special Requests   Final    BOTTLES DRAWN AEROBIC AND ANAEROBIC Blood Culture adequate volume   Culture   Final    NO GROWTH 5 DAYS Performed at [facility], [place] [address] [place], [place] [address]    Report Status 01/10/2022 FINAL  Final  Blood culture (routine x 2)     Status: None   Collection Time: 01/05/22  7:43 PM   Specimen: BLOOD  Result Value Ref Range Status   Specimen Description BLOOD BLOOD RIGHT FOREARM  Final   Special Requests   Final    BOTTLES DRAWN AEROBIC AND ANAEROBIC Blood Culture adequate volume   Culture   Final    NO GROWTH 5 DAYS Performed at [facility], [place] [address] [place], [place] [address]    Report Status 01/10/2022 FINAL  Final         Radiology Studies: No results found.      Scheduled Meds:  amLODipine  10 mg Oral Daily   aspirin EC  325 mg Oral Daily   benzonatate  100 mg Oral TID   budesonide (PULMICORT) nebulizer solution  0.5 mg Nebulization BID   darifenacin  15 mg Oral Daily   dextromethorphan-guaiFENesin  1 tablet Oral BID   docusate sodium  200 mg Oral QHS   fluticasone  2 spray Each Nare QHS   furosemide  20 mg Oral Daily   hydrALAZINE  25 mg Oral Q8H   insulin aspart  0-20 Units Subcutaneous TID WC   insulin aspart  0-5 Units  Subcutaneous QHS   insulin aspart  5 Units Subcutaneous TID WC   insulin glargine-yfgn  10 Units Subcutaneous Daily   ipratropium-albuterol  3 mL Nebulization BID   loratadine  10 mg Oral Daily   mouth rinse  15 mL Mouth Rinse BID   melatonin  3 mg Oral QHS   pantoprazole  40 mg Oral Q0600   predniSONE  40 mg Oral QAC breakfast   sertraline  50 mg Oral Daily   vitamin B-12  1,000 mcg Oral Daily   Continuous Infusions:     LOS: 7 days    Time spent: 35-minute    Irine Seal, MD [facility]   To contact the attending provider between 7A-7P or the covering provider during after hours 7P-7A, please log into the web site [email or web address] and access using universal [place] password for that web site. If you do not have the password, please call the hospital operator.  01/12/2022, 6:57 PM

## 2022-01-13 ENCOUNTER — Other Ambulatory Visit: Payer: Self-pay | Admitting: Family Medicine

## 2022-01-13 ENCOUNTER — Inpatient Hospital Stay (HOSPITAL_COMMUNITY): Payer: Medicare HMO

## 2022-01-13 DIAGNOSIS — J189 Pneumonia, unspecified organism: Secondary | ICD-10-CM | POA: Diagnosis not present

## 2022-01-13 DIAGNOSIS — N179 Acute kidney failure, unspecified: Secondary | ICD-10-CM | POA: Diagnosis not present

## 2022-01-13 DIAGNOSIS — J9601 Acute respiratory failure with hypoxia: Secondary | ICD-10-CM | POA: Diagnosis not present

## 2022-01-13 DIAGNOSIS — J301 Allergic rhinitis due to pollen: Secondary | ICD-10-CM | POA: Diagnosis not present

## 2022-01-13 LAB — GLUCOSE, CAPILLARY
Glucose-Capillary: 183 mg/dL — ABNORMAL HIGH (ref 70–99)
Glucose-Capillary: 202 mg/dL — ABNORMAL HIGH (ref 70–99)
Glucose-Capillary: 150 mg/dL — ABNORMAL HIGH (ref 70–99)
Glucose-Capillary: 75 mg/dL (ref 70–99)

## 2022-01-13 LAB — CBC
HCT: 30.9 % — ABNORMAL LOW (ref 36.0–46.0)
Hemoglobin: 9.8 g/dL — ABNORMAL LOW (ref 12.0–15.0)
MCH: 28.3 pg (ref 26.0–34.0)
MCHC: 31.7 g/dL (ref 30.0–36.0)
MCV: 89.3 fL (ref 80.0–100.0)
Platelets: 211 10*3/uL (ref 150–400)
RBC: 3.46 MIL/uL — ABNORMAL LOW (ref 3.87–5.11)
RDW: 14.8 % (ref 11.5–15.5)
WBC: 9.1 10*3/uL (ref 4.0–10.5)
nRBC: 0 % (ref 0.0–0.2)

## 2022-01-13 LAB — BASIC METABOLIC PANEL
Anion gap: 8 (ref 5–15)
BUN: 40 mg/dL — ABNORMAL HIGH (ref 8–23)
CO2: 28 mmol/L (ref 22–32)
Calcium: 8.6 mg/dL — ABNORMAL LOW (ref 8.9–10.3)
Chloride: 101 mmol/L (ref 98–111)
Creatinine, Ser: 1.37 mg/dL — ABNORMAL HIGH (ref 0.44–1.00)
GFR, Estimated: 37 mL/min — ABNORMAL LOW (ref >60)
Glucose, Bld: 203 mg/dL — ABNORMAL HIGH (ref 70–99)
Potassium: 4.5 mmol/L (ref 3.5–5.1)
Sodium: 137 mmol/L (ref 135–145)

## 2022-01-13 MED ORDER — SODIUM CHLORIDE 3 % IN NEBU
4.0000 mL | INHALATION_SOLUTION | Freq: Two times a day (BID) | RESPIRATORY_TRACT | Status: AC
  Administered 2022-01-13 – 2022-01-16 (×6): 4 mL via RESPIRATORY_TRACT
  Filled 2022-01-13 (×6): qty 4

## 2022-01-13 NOTE — Progress Notes (Signed)
PROGRESS NOTE    Renee [name]  WIO:[id number] DOB: 1928/10/13 DOA: 01/05/2022 PCP: [name], MD    Chief Complaint  Patient presents with   Pneumonia   UTI    Brief Narrative:  PMH of type II DM, HTN, chronic HFpEF, OSA, obesity.  Presents with complaints of shortness of breath and cough found to have pneumonia as well as possible mild exacerbation of asthma.    Assessment & Plan:  Principal Problem:   Sepsis due to pneumonia [facility]) Active Problems:   CAP (community acquired pneumonia)   Acute respiratory failure with hypoxia ([place])   Acute renal failure superimposed on stage 3b chronic kidney disease (HCC)   Asthma, chronic, unspecified asthma severity, with acute exacerbation   Chronic diastolic CHF (congestive heart failure) (HCC)   Type 2 diabetes, controlled, with retinopathy ([place])   Essential hypertension   Anxiety and depression   History of CVA (cerebrovascular accident)   Generalized weakness   Allergic rhinitis   Obesity (BMI 30-39.9)    Assessment and Plan: * Sepsis due to pneumonia ([place]) Acute hypoxic respiratory failure. Mild asthma exacerbation/Pneumonia With fever, tachypnea and elevated lactic acid meets SIRS criteria.  Chest x-ray showing evidence of pneumonia with elevated procalcitonin meeting criteria for infection. O2 saturation 88% on room air with tachypnea on admission.  Currently sats of 96% on room air when sitting up.  COVID-19 PCR negative, influenza A and B PCR negative. Blood cultures so far negative. -Patient with improvement with wheezing still with diffuse rhonchorous breath sounds. -Transitioned from IV steroids to oral prednisone taper. -Status post 7 days IV antibiotics.   -Continue scheduled DuoNebs, Pulmicort, Claritin, PPI, Flonase, Mucinex.. -We will add 3% saline nebs. -Continue flutter valve, incentive spirometry. -Continue Lasix. -If no improvement in the next 24 hours we will have pulmonary assessed  patient. -Chest PT.  CAP (community acquired pneumonia) - See above.  Acute renal failure superimposed on stage 3b chronic kidney disease (HCC) Baseline serum creatinine around 1.3.  GFR more than 30 prior to admission. Serum creatinine on admission 1.8, worsened to 2.2 with improvement with IV hydration.  -Patient placed on IV Lasix creatinine down to 1.37 today.  -Urine output of 850cc over the past 24 hours.  -Continue Lasix 20 mg daily.   -Follow.  Chronic diastolic CHF (congestive heart failure) (HCC) Moderate pericardial effusion. Mildly volume overloaded early on during the hospitalization.  Chest x-ray showed evidence of vascular congestion. Echocardiogram shows preserved EF. Also shows pericardial effusion moderate without any tamponade. -Patient has been receiving IV Lasix and has been transitioned to Lasix 20 mg daily.  -Continue Lasix 20 mg daily. -May need to increase home regimen Lasix from 10 mg to 20 mg daily on discharge.  Obesity (BMI 30-39.9) OSA Not on CPAP  Body mass index is 31.52 kg/m.  Placing the pt at higher risk of poor outcomes.  Allergic rhinitis - Claritin, Flonase.  History of CVA (cerebrovascular accident) With left hemiparesis. Exam unchanged. -Continue aspirin, Monitor.  PT OT recommends home with home health.  Anxiety and depression Continue home regimen Zoloft and Xanax.  Essential hypertension -BP noted to be elevated patient started back on half home dose of Norvasc 5 mg daily as well as hydralazine 25 mg 3 times daily.   -Norvasc increased to 10 mg daily for better blood pressure control.   -Continue Lasix 20 mg daily.   -Follow.  Type 2 diabetes, controlled, with retinopathy ([place]) Uncontrolled with hyperglycemia due to steroids. Hemoglobin  A1c 6.8 roughly 2 weeks ago. Not on any insulin at home. -Elevated CBGs in light secondary to steroids. -Steroids being tapered.  -CBG of 183 this morning. -Continue Semglee 10 units  daily, SSI, NovoLog 5 units 3 times daily with meals, SSI.         DVT prophylaxis: SCDs Code Status: DNR Family Communication: Updated patient and caretaker at bedside. Disposition: Likely home when clinically improved.  Status is: Inpatient Remains inpatient appropriate because: Severity of illness   Consultants:  None  Procedures:  Chest x-ray 01/05/2022, 01/08/2022 2D echo 01/06/2022   Antimicrobials:  Review azithromycin 01/05/2022>>> 01/06/2022 IV Rocephin 01/05/2022>>>>> 01/11/2022   Subjective: Patient sitting up in chair breathing better.  No chest pain.  Still very rhonchorous and congested.  Caretaker at bedside wondering whether patient needs to be assessed by pulmonary.  Objective: Vitals:   01/13/22 0500 01/13/22 0813 01/13/22 0847 01/13/22 1531  BP: (!) 154/83 (!) 150/68  140/65  Pulse: 66 69  66  Resp: '19 17  20  '$ Temp: 97.6 F (36.4 C) 97.9 F (36.6 C)  97.9 F (36.6 C)  TempSrc: Oral Oral  Oral  SpO2: 99% 94% 96% 97%  Weight: 80.9 kg     Height:        Intake/Output Summary (Last 24 hours) at 01/13/2022 1648 Last data filed at 01/13/2022 1151 Gross per 24 hour  Intake --  Output 1000 ml  Net -1000 ml   Filed Weights   01/11/22 0500 01/12/22 0500 01/13/22 0500  Weight: 81.6 kg 80.9 kg 80.9 kg    Examination:  General exam: NAD Respiratory system: Diffuse coarse rhonchorous breath sounds.  No significant wheezing.  Fair air movement.  Speaking in full sentences.   Cardiovascular system: Regular rate rhythm no murmurs rubs or gallops.  No JVD.  No lower extremity edema.   Gastrointestinal system: Abdomen is nondistended, soft and nontender. No organomegaly or masses felt. Normal bowel sounds heard. Central nervous system: Alert and oriented. No focal neurological deficits. Extremities: Symmetric 5 x 5 power. Skin: No rashes, lesions or ulcers Psychiatry: Judgement and insight appear normal. Mood & affect appropriate.     Data Reviewed:    CBC: Recent Labs  Lab 01/09/22 0117 01/10/22 0349 01/11/22 0258 01/12/22 0626 01/13/22 0139  WBC 4.5 6.4 9.2 6.9 9.1  HGB 9.5* 9.9* 9.8* 9.7* 9.8*  HCT 30.3* 30.7* 31.0* 30.6* 30.9*  MCV 90.7 88.0 88.3 90.0 89.3  PLT 130* 158 187 172 034    Basic Metabolic Panel: Recent Labs  Lab 01/08/22 0411 01/09/22 0117 01/10/22 0349 01/11/22 0258 01/12/22 0626 01/13/22 0139  NA 139 136 135 135 139 137  K 4.1 4.8 5.1 4.1 4.1 4.5  CL 106 103 101 101 103 101  CO2 '26 25 29 28 27 28  '$ GLUCOSE 188* 341* 259* 72 153* 203*  BUN 37* 39* 36* 45* 37* 40*  CREATININE 1.60* 1.48* 1.36* 1.40* 1.31* 1.37*  CALCIUM 8.2* 8.3* 8.4* 8.5* 8.4* 8.6*  MG 2.1 1.9 1.9 1.8 1.7  --     GFR: Estimated Creatinine Clearance: 27.5 mL/min (A) (by C-G formula based on SCr of 1.37 mg/dL (H)).  Liver Function Tests: Recent Labs  Lab 01/10/22 0349 01/11/22 0258 01/12/22 0626  AST '16 21 17  '$ ALT '16 25 23  '$ ALKPHOS 54 54 50  BILITOT 0.3 0.2* 0.5  PROT 4.9* 4.8* 4.6*  ALBUMIN 2.5* 2.7* 2.6*    CBG: Recent Labs  Lab 01/12/22 1604 01/12/22 2036 01/13/22 7425  01/13/22 1148 01/13/22 1636  GLUCAP 206* 206* 183* 202* 150*     Recent Results (from the past 240 hour(s))  Resp Panel by RT-PCR (Flu A&B, Covid) Nasopharyngeal Swab     Status: None   Collection Time: 01/05/22  6:55 PM   Specimen: Nasopharyngeal Swab; Nasopharyngeal(NP) swabs in vial transport medium  Result Value Ref Range Status   SARS Coronavirus 2 by RT PCR NEGATIVE NEGATIVE Final    Comment: (NOTE) SARS-CoV-2 target nucleic acids are NOT DETECTED.  The SARS-CoV-2 RNA is generally detectable in upper respiratory specimens during the acute phase of infection. The lowest concentration of SARS-CoV-2 viral copies this assay can detect is 138 copies/mL. A negative result does not preclude SARS-Cov-2 infection and should not be used as the sole basis for treatment or other patient management decisions. A negative result may occur with   improper specimen collection/handling, submission of specimen other than nasopharyngeal swab, presence of viral mutation(s) within the areas targeted by this assay, and inadequate number of viral copies(<138 copies/mL). A negative result must be combined with clinical observations, patient history, and epidemiological information. The expected result is Negative.  Fact Sheet for Patients:  EntrepreneurPulse.com.au  Fact Sheet for Healthcare Providers:  IncredibleEmployment.be  This test is no t yet approved or cleared by the [place] FDA and  has been authorized for detection and/or diagnosis of SARS-CoV-2 by FDA under an Emergency Use Authorization (EUA). This EUA will remain  in effect (meaning this test can be used) for the duration of the COVID-19 declaration under Section 564(b)(1) of the Act, 21 U.S.C.section 360bbb-3(b)(1), unless the authorization is terminated  or revoked sooner.       Influenza A by PCR NEGATIVE NEGATIVE Final   Influenza B by PCR NEGATIVE NEGATIVE Final    Comment: (NOTE) The Xpert Xpress SARS-CoV-2/FLU/RSV plus assay is intended as an aid in the diagnosis of influenza from Nasopharyngeal swab specimens and should not be used as a sole basis for treatment. Nasal washings and aspirates are unacceptable for Xpert Xpress SARS-CoV-2/FLU/RSV testing.  Fact Sheet for Patients: EntrepreneurPulse.com.au  Fact Sheet for Healthcare Providers: IncredibleEmployment.be  This test is not yet approved or cleared by the [place] FDA and has been authorized for detection and/or diagnosis of SARS-CoV-2 by FDA under an Emergency Use Authorization (EUA). This EUA will remain in effect (meaning this test can be used) for the duration of the COVID-19 declaration under Section 564(b)(1) of the Act, 21 U.S.C. section 360bbb-3(b)(1), unless the authorization is terminated  or revoked.  Performed at [facility], [place] [address], [place], [place] [address]   Blood culture (routine x 2)     Status: None   Collection Time: 01/05/22  7:00 PM   Specimen: BLOOD  Result Value Ref Range Status   Specimen Description BLOOD RIGHT ANTECUBITAL  Final   Special Requests   Final    BOTTLES DRAWN AEROBIC AND ANAEROBIC Blood Culture adequate volume   Culture   Final    NO GROWTH 5 DAYS Performed at [facility], [place] [address], [place], [place] [address]    Report Status 01/10/2022 FINAL  Final  Blood culture (routine x 2)     Status: None   Collection Time: 01/05/22  7:43 PM   Specimen: BLOOD  Result Value Ref Range Status   Specimen Description BLOOD BLOOD RIGHT FOREARM  Final   Special Requests   Final    BOTTLES DRAWN AEROBIC AND ANAEROBIC Blood Culture adequate  volume   Culture   Final    NO GROWTH 5 DAYS Performed at [facility], [place] [address] [place], [place] [address]    Report Status 01/10/2022 FINAL  Final         Radiology Studies: No results found.      Scheduled Meds:  amLODipine  10 mg Oral Daily   aspirin EC  325 mg Oral Daily   benzonatate  100 mg Oral TID   budesonide (PULMICORT) nebulizer solution  0.5 mg Nebulization BID   darifenacin  15 mg Oral Daily   dextromethorphan-guaiFENesin  1 tablet Oral BID   docusate sodium  200 mg Oral QHS   fluticasone  2 spray Each Nare QHS   furosemide  20 mg Oral Daily   hydrALAZINE  25 mg Oral Q8H   insulin aspart  0-20 Units Subcutaneous TID WC   insulin aspart  0-5 Units Subcutaneous QHS   insulin aspart  5 Units Subcutaneous TID WC   insulin glargine-yfgn  10 Units Subcutaneous Daily   ipratropium-albuterol  3 mL Nebulization BID   loratadine  10 mg Oral Daily   mouth rinse  15 mL Mouth Rinse BID   melatonin  3 mg Oral QHS   pantoprazole  40 mg Oral Q0600   predniSONE  40 mg Oral QAC breakfast   sertraline  50 mg Oral Daily   sodium chloride HYPERTONIC  4 mL  Nebulization BID   vitamin B-12  1,000 mcg Oral Daily   Continuous Infusions:     LOS: 8 days    Time spent: 35-minute    Irine Seal, MD [facility]   To contact the attending provider between 7A-7P or the covering provider during after hours 7P-7A, please log into the web site [email or web address] and access using universal [place] password for that web site. If you do not have the password, please call the hospital operator.  01/13/2022, 4:48 PM

## 2022-01-13 NOTE — Progress Notes (Signed)
Occupational Therapy Treatment Patient Details Name: Renee [name] MRN: [id number] DOB: 26-Nov-1928 Today's Date: 01/13/2022   History of present illness Renee [name] is a 88 y.o. female who presented with geenralized weakness ongoing for 3 days. Pt admitted for  severe sepsis due to community-acquired pneumonia . Pt with medical history significant for type 2 diabetes mellitus, mild intermittent asthma, essential pretension, allergic rhinitis, chronic diastolic heart failure.   OT comments  Pt agreeable to OT session, she required minA for bed mobility and modA to stand from EOB. Pt required modA for LB dressing and setup assistance for grooming while seated EOB. Pt's caregiver present during session. Discussed use of owned w/c for longer distance mobility at home to reduce risk for falls. Will continue to follow acutely.    Recommendations for follow up therapy are one component of a multi-disciplinary discharge planning process, led by the attending physician.  Recommendations may be updated based on patient status, additional functional criteria and insurance authorization.    Follow Up Recommendations  Home health OT    Assistance Recommended at Discharge Frequent or constant Supervision/Assistance  Patient can return home with the following  A lot of help with walking and/or transfers;A lot of help with bathing/dressing/bathroom;Assistance with cooking/housework;Assistance with feeding;Direct supervision/assist for medications management;Direct supervision/assist for financial management;Help with stairs or ramp for entrance   Equipment Recommendations  Wheelchair (measurements OT);Wheelchair cushion (measurements OT)    Recommendations for Other Services      Precautions / Restrictions Precautions Precautions: Fall Precaution Comments: watch O2 Restrictions Weight Bearing Restrictions: No       Mobility Bed Mobility Overal bed mobility: Needs Assistance Bed Mobility: Sit  to Supine       Sit to supine: Min assist   General bed mobility comments: minA wtih BLE management with return to bed    Transfers Overall transfer level: Needs assistance Equipment used: Rolling walker (2 wheels) Transfers: Sit to/from Stand Sit to Stand: Mod assist           General transfer comment: modA to powerup from EOB     Balance Overall balance assessment: Needs assistance Sitting-balance support: Feet supported Sitting balance-Leahy Scale: Fair     Standing balance support: Bilateral upper extremity supported, During functional activity, Reliant on assistive device for balance Standing balance-Leahy Scale: Poor                             ADL either performed or assessed with clinical judgement   ADL Overall ADL's : Needs assistance/impaired     Grooming: Set up;Sitting               Lower Body Dressing: Maximal assistance;Sit to/from stand   Toilet Transfer: Moderate assistance Toilet Transfer Details (indicate cue type and reason): modA to powerup from EOB, pt would require minA for stand pivot transfer Toileting- Clothing Manipulation and Hygiene: Moderate assistance;Sit to/from stand       Functional mobility during ADLs: Moderate assistance General ADL Comments: pt with decreased activity tolerance, she required modA to powerup into standing from EOB this session. Pt tolerated 103mrches in place and lateral side stepping along EOB in both directions before requesting to return to bed.    Extremity/Trunk Assessment Upper Extremity Assessment Upper Extremity Assessment: Generalized weakness LUE Deficits / Details: grossly 3/5 mmt; weak from prior CVA LUE Sensation: WNL LUE Coordination: decreased fine motor;decreased gross motor   Lower Extremity Assessment Lower Extremity Assessment: Defer  to PT evaluation RLE Deficits / Details: Grossly 4/5 except hip flexion 2+/5 LLE Deficits / Details: Grossly 4/5 except hip flexion  2+/5        Vision   Vision Assessment?: No apparent visual deficits   Perception     Praxis      Cognition Arousal/Alertness: Awake/alert Behavior During Therapy: Flat affect Overall Cognitive Status: Impaired/Different from baseline Area of Impairment: Safety/judgement, Problem solving, Attention, Following commands                   Current Attention Level: Sustained   Following Commands: Follows one step commands with increased time, Follows multi-step commands inconsistently Safety/Judgement: Decreased awareness of deficits   Problem Solving: Slow processing, Requires verbal cues, Decreased initiation, Difficulty sequencing, Requires tactile cues General Comments: increased time for processing information        Exercises      Shoulder Instructions       General Comments Pt on RA throughout session, SpO2 >94%    Pertinent Vitals/ Pain       Pain Assessment Pain Assessment: No/denies pain  Home Living                                          Prior Functioning/Environment              Frequency  Min 2X/week        Progress Toward Goals  OT Goals(current goals can now be found in the care plan section)  Progress towards OT goals: Progressing toward goals  Acute Rehab OT Goals Patient Stated Goal: to go home OT Goal Formulation: With patient Time For Goal Achievement: 01/20/22 Potential to Achieve Goals: Good ADL Goals Pt Will Perform Grooming: with set-up;sitting Pt Will Perform Lower Body Bathing: with min assist;sit to/from stand Pt Will Perform Lower Body Dressing: with min assist;sit to/from stand Pt Will Transfer to Toilet: with min assist;ambulating Additional ADL Goal #1: pt will tolerate at least 3 minutes of OOB standing functional activity with min G  Plan Discharge plan remains appropriate;Frequency remains appropriate    Co-evaluation                 AM-PAC OT "6 Clicks" Daily Activity      Outcome Measure   Help from another person eating meals?: None Help from another person taking care of personal grooming?: A Little Help from another person toileting, which includes using toliet, bedpan, or urinal?: A Lot Help from another person bathing (including washing, rinsing, drying)?: A Little Help from another person to put on and taking off regular upper body clothing?: A Little Help from another person to put on and taking off regular lower body clothing?: A Little 6 Click Score: 18    End of Session Equipment Utilized During Treatment: Gait belt;Rolling walker (2 wheels);Oxygen  OT Visit Diagnosis: Other abnormalities of gait and mobility (R26.89);Unsteadiness on feet (R26.81);Muscle weakness (generalized) (M62.81)   Activity Tolerance Patient tolerated treatment well   Patient Left in chair;with call bell/phone within reach;with chair alarm set;with family/visitor present   Nurse Communication Mobility status        Time: 6195-0932 OT Time Calculation (min): 22 min  Charges: OT General Charges $OT Visit: 1 Visit OT Treatments $Self Care/Home Management : 8-22 mins  Helene Kelp OTR/L Acute Rehabilitation Services Office: [place] 01/13/2022, 3:00 PM

## 2022-01-13 NOTE — Progress Notes (Signed)
Tolerated cpap till approximately 0100. Found when o2 sat dipped below 90- pt stated she couldn't tolerate any longer because it was burning her nose!

## 2022-01-14 DIAGNOSIS — A419 Sepsis, unspecified organism: Secondary | ICD-10-CM | POA: Diagnosis not present

## 2022-01-14 DIAGNOSIS — N179 Acute kidney failure, unspecified: Secondary | ICD-10-CM | POA: Diagnosis not present

## 2022-01-14 DIAGNOSIS — J9601 Acute respiratory failure with hypoxia: Secondary | ICD-10-CM | POA: Diagnosis not present

## 2022-01-14 DIAGNOSIS — J301 Allergic rhinitis due to pollen: Secondary | ICD-10-CM | POA: Diagnosis not present

## 2022-01-14 DIAGNOSIS — J189 Pneumonia, unspecified organism: Secondary | ICD-10-CM | POA: Diagnosis not present

## 2022-01-14 LAB — CBC WITH DIFFERENTIAL/PLATELET
Abs Immature Granulocytes: 0.51 10*3/uL — ABNORMAL HIGH (ref 0.00–0.07)
Basophils Absolute: 0 10*3/uL (ref 0.0–0.1)
Basophils Relative: 0 %
Eosinophils Absolute: 0 10*3/uL (ref 0.0–0.5)
Eosinophils Relative: 0 %
HCT: 31.7 % — ABNORMAL LOW (ref 36.0–46.0)
Hemoglobin: 10.1 g/dL — ABNORMAL LOW (ref 12.0–15.0)
Immature Granulocytes: 5 %
Lymphocytes Relative: 11 %
Lymphs Abs: 1.2 10*3/uL (ref 0.7–4.0)
MCH: 28.4 pg (ref 26.0–34.0)
MCHC: 31.9 g/dL (ref 30.0–36.0)
MCV: 89 fL (ref 80.0–100.0)
Monocytes Absolute: 0.5 10*3/uL (ref 0.1–1.0)
Monocytes Relative: 5 %
Neutro Abs: 8.5 10*3/uL — ABNORMAL HIGH (ref 1.7–7.7)
Neutrophils Relative %: 79 %
Platelets: 215 10*3/uL (ref 150–400)
RBC: 3.56 MIL/uL — ABNORMAL LOW (ref 3.87–5.11)
RDW: 14.7 % (ref 11.5–15.5)
WBC: 10.7 10*3/uL — ABNORMAL HIGH (ref 4.0–10.5)
nRBC: 0.2 % (ref 0.0–0.2)

## 2022-01-14 LAB — GLUCOSE, CAPILLARY
Glucose-Capillary: 164 mg/dL — ABNORMAL HIGH (ref 70–99)
Glucose-Capillary: 146 mg/dL — ABNORMAL HIGH (ref 70–99)
Glucose-Capillary: 210 mg/dL — ABNORMAL HIGH (ref 70–99)
Glucose-Capillary: 294 mg/dL — ABNORMAL HIGH (ref 70–99)

## 2022-01-14 LAB — BASIC METABOLIC PANEL
Anion gap: 7 (ref 5–15)
BUN: 39 mg/dL — ABNORMAL HIGH (ref 8–23)
CO2: 27 mmol/L (ref 22–32)
Calcium: 8.5 mg/dL — ABNORMAL LOW (ref 8.9–10.3)
Chloride: 103 mmol/L (ref 98–111)
Creatinine, Ser: 1.41 mg/dL — ABNORMAL HIGH (ref 0.44–1.00)
GFR, Estimated: 35 mL/min — ABNORMAL LOW (ref >60)
Glucose, Bld: 143 mg/dL — ABNORMAL HIGH (ref 70–99)
Potassium: 4.5 mmol/L (ref 3.5–5.1)
Sodium: 137 mmol/L (ref 135–145)

## 2022-01-14 MED ORDER — PREDNISONE 20 MG PO TABS
30.0000 mg | ORAL_TABLET | Freq: Every day | ORAL | Status: DC
  Administered 2022-01-15 – 2022-01-16 (×2): 30 mg via ORAL
  Filled 2022-01-14 (×3): qty 1

## 2022-01-14 MED ORDER — METOPROLOL SUCCINATE ER 25 MG PO TB24
12.5000 mg | ORAL_TABLET | Freq: Two times a day (BID) | ORAL | Status: DC
  Administered 2022-01-14 – 2022-01-16 (×5): 12.5 mg via ORAL
  Filled 2022-01-14 (×5): qty 1

## 2022-01-14 MED ORDER — HYDROCHLOROTHIAZIDE 12.5 MG PO TABS
12.5000 mg | ORAL_TABLET | Freq: Every day | ORAL | Status: DC
  Administered 2022-01-14 – 2022-01-16 (×3): 12.5 mg via ORAL
  Filled 2022-01-14 (×3): qty 1

## 2022-01-14 NOTE — Progress Notes (Signed)
PROGRESS NOTE    Renee [name]  QPY:[id number] DOB: 1928-10-01 DOA: 01/05/2022 PCP: [name], MD    Chief Complaint  Patient presents with   Pneumonia   UTI    Brief Narrative:  PMH of type II DM, HTN, chronic HFpEF, OSA, obesity.  Presents with complaints of shortness of breath and cough found to have pneumonia as well as possible mild exacerbation of asthma.    Assessment & Plan:  Principal Problem:   Sepsis due to pneumonia [facility]) Active Problems:   CAP (community acquired pneumonia)   Acute respiratory failure with hypoxia ([place])   Acute renal failure superimposed on stage 3b chronic kidney disease (HCC)   Asthma, chronic, unspecified asthma severity, with acute exacerbation   Chronic diastolic CHF (congestive heart failure) (HCC)   Type 2 diabetes, controlled, with retinopathy ([place])   Essential hypertension   Anxiety and depression   History of CVA (cerebrovascular accident)   Generalized weakness   Allergic rhinitis   Obesity (BMI 30-39.9)    Assessment and Plan: * Sepsis due to pneumonia ([place]) Acute hypoxic respiratory failure. Mild asthma exacerbation/Pneumonia With fever, tachypnea and elevated lactic acid meets SIRS criteria.  Chest x-ray showing evidence of pneumonia with elevated procalcitonin meeting criteria for infection on admission. O2 saturation 88% on room air with tachypnea on admission.  Currently sats of 98% on room air when sitting up.  COVID-19 PCR negative, influenza A and B PCR negative. Blood cultures so far negative. -Patient with improvement with wheezing still with diffuse rhonchorous breath sounds. -Transitioned from IV steroids to oral prednisone taper. -Status post 7 days IV antibiotics.   -Continue scheduled DuoNebs, Pulmicort, Claritin, PPI, Flonase, Mucinex, 3% saline nebs, oral Lasix, flutter valve, incentive spirometry, chest vest. -Due to continuous rhonchorous breath sounds/congestion caretaker/family requesting pulmonary  input. -Consult with PCCM/pulmonary for further evaluation and management.Marland Kitchen  CAP (community acquired pneumonia) - See above.  Acute renal failure superimposed on stage 3b chronic kidney disease (HCC) Baseline serum creatinine around 1.3.  GFR more than 30 prior to admission. Serum creatinine on admission 1.8, worsened to 2.2 with improvement with IV hydration.  -Patient received IV Lasix currently on oral Lasix.   -Urine output of 1.050 L over the past 24 hours.  -Creatinine stable at 1.41. -Follow.  Chronic diastolic CHF (congestive heart failure) (HCC) Moderate pericardial effusion. Mildly volume overloaded early on during the hospitalization.  Chest x-ray showed evidence of vascular congestion. Echocardiogram shows preserved EF. Also shows pericardial effusion moderate without any tamponade. -Patient has been receiving IV Lasix and has been transitioned to Lasix 20 mg daily.  -Continue Lasix 20 mg daily. -May need to increase home regimen Lasix from 10 mg to 20 mg daily on discharge. -Resume home regimen beta-blocker and HCTZ.  Obesity (BMI 30-39.9) OSA Not on CPAP  Body mass index is 31.52 kg/m.  Placing the pt at higher risk of poor outcomes.  Allergic rhinitis - Claritin, Flonase.  History of CVA (cerebrovascular accident) With left hemiparesis. Exam unchanged. -Continue aspirin, Monitor.  PT OT recommends home with home health.  Anxiety and depression Continue home regimen Zoloft and Xanax.  Essential hypertension -BP noted to be elevated patient started back on half home dose of Norvasc 5 mg daily as well as hydralazine 25 mg 3 times daily.   -Norvasc increased to 10 mg daily for better blood pressure control.   -Continue Lasix 20 mg daily.  -Resume home regimen HCTZ and Toprol-XL. -Follow.  Type 2 diabetes, controlled,  with retinopathy ([place]) Uncontrolled with hyperglycemia due to steroids. Hemoglobin A1c 6.8 roughly 2 weeks ago. Not on any insulin at  home. -Elevated CBGs in light secondary to steroids. -Steroids being tapered.  -CBG of 164 this morning. -Continue Semglee 10 units daily, SSI, NovoLog 5 units 3 times daily with meals, SSI.         DVT prophylaxis: SCDs Code Status: DNR Family Communication: Updated patient and caretaker at bedside. Disposition: Likely home when clinically improved.  Status is: Inpatient Remains inpatient appropriate because: Severity of illness   Consultants:  None  Procedures:  Chest x-ray 01/05/2022, 01/08/2022, 01/13/2022 2D echo 01/06/2022   Antimicrobials:  Review azithromycin 01/05/2022>>> 01/06/2022 IV Rocephin 01/05/2022>>>>> 01/11/2022   Subjective: Sitting up in bed, getting medications from RN.  States just had chest vest therapy done and feels she is having increased sputum production.  Also did have saline nebs last night.  No chest pain.  Denies any significant shortness of breath.  States may be feeling better.  Caretaker at bedside.  Caretaker and family noted to be requesting pulmonary input.  Objective: Vitals:   01/14/22 0402 01/14/22 0500 01/14/22 0743 01/14/22 0752  BP: (!) 179/66  (!) 184/75   Pulse: 68  66   Resp:   18   Temp: 97.8 F (36.6 C)  98.8 F (37.1 C)   TempSrc: Oral  Oral   SpO2: 94%  98% 98%  Weight:  86.2 kg    Height:        Intake/Output Summary (Last 24 hours) at 01/14/2022 0931 Last data filed at 01/14/2022 0500 Gross per 24 hour  Intake --  Output 1050 ml  Net -1050 ml   Filed Weights   01/12/22 0500 01/13/22 0500 01/14/22 0500  Weight: 80.9 kg 80.9 kg 86.2 kg    Examination:  General exam: NAD Respiratory system: Still with diffuse coarse rhonchorous breath sounds.  No significant wheezing noted.    Cardiovascular system: RRR no murmurs rubs or gallops.  No JVD.  No lower extremity edema.  Gastrointestinal system: Abdomen is nondistended, soft and nontender. No organomegaly or masses felt. Normal bowel sounds heard. Central  nervous system: Alert and oriented. No focal neurological deficits. Extremities: Symmetric 5 x 5 power. Skin: No rashes, lesions or ulcers Psychiatry: Judgement and insight appear normal. Mood & affect appropriate.     Data Reviewed:   CBC: Recent Labs  Lab 01/10/22 0349 01/11/22 0258 01/12/22 0626 01/13/22 0139 01/14/22 0123  WBC 6.4 9.2 6.9 9.1 10.7*  NEUTROABS  --   --   --   --  8.5*  HGB 9.9* 9.8* 9.7* 9.8* 10.1*  HCT 30.7* 31.0* 30.6* 30.9* 31.7*  MCV 88.0 88.3 90.0 89.3 89.0  PLT 158 187 172 211 878    Basic Metabolic Panel: Recent Labs  Lab 01/08/22 0411 01/09/22 0117 01/10/22 0349 01/11/22 0258 01/12/22 0626 01/13/22 0139 01/14/22 0123  NA 139 136 135 135 139 137 137  K 4.1 4.8 5.1 4.1 4.1 4.5 4.5  CL 106 103 101 101 103 101 103  CO2 '26 25 29 28 27 28 27  '$ GLUCOSE 188* 341* 259* 72 153* 203* 143*  BUN 37* 39* 36* 45* 37* 40* 39*  CREATININE 1.60* 1.48* 1.36* 1.40* 1.31* 1.37* 1.41*  CALCIUM 8.2* 8.3* 8.4* 8.5* 8.4* 8.6* 8.5*  MG 2.1 1.9 1.9 1.8 1.7  --   --     GFR: Estimated Creatinine Clearance: 27.6 mL/min (A) (by C-G formula based on SCr of  1.41 mg/dL (H)).  Liver Function Tests: Recent Labs  Lab 01/10/22 0349 01/11/22 0258 01/12/22 0626  AST '16 21 17  '$ ALT '16 25 23  '$ ALKPHOS 54 54 50  BILITOT 0.3 0.2* 0.5  PROT 4.9* 4.8* 4.6*  ALBUMIN 2.5* 2.7* 2.6*    CBG: Recent Labs  Lab 01/13/22 0722 01/13/22 1148 01/13/22 1636 01/13/22 2138 01/14/22 0742  GLUCAP 183* 202* 150* 75 164*     Recent Results (from the past 240 hour(s))  Resp Panel by RT-PCR (Flu A&B, Covid) Nasopharyngeal Swab     Status: None   Collection Time: 01/05/22  6:55 PM   Specimen: Nasopharyngeal Swab; Nasopharyngeal(NP) swabs in vial transport medium  Result Value Ref Range Status   SARS Coronavirus 2 by RT PCR NEGATIVE NEGATIVE Final    Comment: (NOTE) SARS-CoV-2 target nucleic acids are NOT DETECTED.  The SARS-CoV-2 RNA is generally detectable in upper  respiratory specimens during the acute phase of infection. The lowest concentration of SARS-CoV-2 viral copies this assay can detect is 138 copies/mL. A negative result does not preclude SARS-Cov-2 infection and should not be used as the sole basis for treatment or other patient management decisions. A negative result may occur with  improper specimen collection/handling, submission of specimen other than nasopharyngeal swab, presence of viral mutation(s) within the areas targeted by this assay, and inadequate number of viral copies(<138 copies/mL). A negative result must be combined with clinical observations, patient history, and epidemiological information. The expected result is Negative.  Fact Sheet for Patients:  EntrepreneurPulse.com.au  Fact Sheet for Healthcare Providers:  IncredibleEmployment.be  This test is no t yet approved or cleared by the [place] FDA and  has been authorized for detection and/or diagnosis of SARS-CoV-2 by FDA under an Emergency Use Authorization (EUA). This EUA will remain  in effect (meaning this test can be used) for the duration of the COVID-19 declaration under Section 564(b)(1) of the Act, 21 U.S.C.section 360bbb-3(b)(1), unless the authorization is terminated  or revoked sooner.       Influenza A by PCR NEGATIVE NEGATIVE Final   Influenza B by PCR NEGATIVE NEGATIVE Final    Comment: (NOTE) The Xpert Xpress SARS-CoV-2/FLU/RSV plus assay is intended as an aid in the diagnosis of influenza from Nasopharyngeal swab specimens and should not be used as a sole basis for treatment. Nasal washings and aspirates are unacceptable for Xpert Xpress SARS-CoV-2/FLU/RSV testing.  Fact Sheet for Patients: EntrepreneurPulse.com.au  Fact Sheet for Healthcare Providers: IncredibleEmployment.be  This test is not yet approved or cleared by the [place] FDA and has been  authorized for detection and/or diagnosis of SARS-CoV-2 by FDA under an Emergency Use Authorization (EUA). This EUA will remain in effect (meaning this test can be used) for the duration of the COVID-19 declaration under Section 564(b)(1) of the Act, 21 U.S.C. section 360bbb-3(b)(1), unless the authorization is terminated or revoked.  Performed at [facility], [place] [address], [place], [place] [address]   Blood culture (routine x 2)     Status: None   Collection Time: 01/05/22  7:00 PM   Specimen: BLOOD  Result Value Ref Range Status   Specimen Description BLOOD RIGHT ANTECUBITAL  Final   Special Requests   Final    BOTTLES DRAWN AEROBIC AND ANAEROBIC Blood Culture adequate volume   Culture   Final    NO GROWTH 5 DAYS Performed at [facility], [place] [address], [place], [place] [address]    Report Status 01/10/2022  FINAL  Final  Blood culture (routine x 2)     Status: None   Collection Time: 01/05/22  7:43 PM   Specimen: BLOOD  Result Value Ref Range Status   Specimen Description BLOOD BLOOD RIGHT FOREARM  Final   Special Requests   Final    BOTTLES DRAWN AEROBIC AND ANAEROBIC Blood Culture adequate volume   Culture   Final    NO GROWTH 5 DAYS Performed at [facility], [address] [place], [place] [address]    Report Status 01/10/2022 FINAL  Final         Radiology Studies: DG CHEST PORT 1 VIEW  Result Date: 01/13/2022 CLINICAL DATA:  Congestion, shortness of breath EXAM: PORTABLE CHEST 1 VIEW COMPARISON:  01/08/2022 FINDINGS: Lungs are clear.  No pleural effusion or pneumothorax. The heart is normal in size. IMPRESSION: No evidence of acute cardiopulmonary disease. Electronically Signed   By: Julian Hy M.D.   On: 01/13/2022 18:03        Scheduled Meds:  amLODipine  10 mg Oral Daily   aspirin EC  325 mg Oral Daily   benzonatate  100 mg Oral TID   budesonide (PULMICORT) nebulizer solution  0.5 mg Nebulization BID   darifenacin  15 mg  Oral Daily   dextromethorphan-guaiFENesin  1 tablet Oral BID   docusate sodium  200 mg Oral QHS   fluticasone  2 spray Each Nare QHS   furosemide  20 mg Oral Daily   hydrALAZINE  25 mg Oral Q8H   hydrochlorothiazide  12.5 mg Oral Daily   insulin aspart  0-20 Units Subcutaneous TID WC   insulin aspart  0-5 Units Subcutaneous QHS   insulin aspart  5 Units Subcutaneous TID WC   insulin glargine-yfgn  10 Units Subcutaneous Daily   ipratropium-albuterol  3 mL Nebulization BID   loratadine  10 mg Oral Daily   mouth rinse  15 mL Mouth Rinse BID   melatonin  3 mg Oral QHS   metoprolol succinate  12.5 mg Oral BID   pantoprazole  40 mg Oral Q0600   sertraline  50 mg Oral Daily   sodium chloride HYPERTONIC  4 mL Nebulization BID   vitamin B-12  1,000 mcg Oral Daily   Continuous Infusions:     LOS: 9 days    Time spent: 35-minute    Irine Seal, MD [facility]   To contact the attending provider between 7A-7P or the covering provider during after hours 7P-7A, please log into the web site [email or web address] and access using universal [place] password for that web site. If you do not have the password, please call the hospital operator.  01/14/2022, 9:31 AM

## 2022-01-14 NOTE — Consult Note (Signed)
NAME:  Renee [name], MRN:  [id number], DOB:  23-Dec-1928, LOS: 9 ADMISSION DATE:  01/05/2022, CONSULTATION DATE:  01/14/22 REFERRING MD:  [place]ville Silos - TR[facility] , CHIEF COMPLAINT:  Congestion  History of Present Illness:  88 yo F PMH DNR status, HFpEF, ?asthma, CKD 3b, DM, cva who was admitted to [facility] 5/19 for CAP and possible asthma exacerbation. It looks like pt previously saw Dr. Halford Chessman in with [place] for follow up of pleural effusion after hospital admission in 2021  but does not follow with [facility] otherwise.  This admission she has had appropriate tx for CAP. She has had steroids and continued on Bds. Has been diuresed. She/ family / caretaker are however concerned about ongoing congestion and have requested pulm consultation in this setting.    On my arrival pt states she feels fair. She is 98% on RA and comfortable. We (pt myself and caregiver) talked about mobility, IS, flutter, pulm hygiene this admission. Sounds like pt is largely in bed this admission. I had her demo IS for me (which caregiver has her use q1hr) and she is pulling <250 Vt with very short inspiration. She feels that the chest PT has started to help her chest congestion.  When she coughed during our interview I noticed she swallows her mucus before attempting suctioning or spitting out production. I asked caregiver if she coughs when eating/drinking at home and caregiver said no but she chokes a little. Caregiver reports that she and family are very concerned about possible dc with ongoing congestion.    Pertinent  Medical History  HFpEF Possible Asthma Possible sleep apnea not on CPAP DM HTN CKD  Obesity  CVA Significant Hospital Events: Including procedures, antibiotic start and stop dates in addition to other pertinent events   5/19 admit to [facility] for CAP, possible asthma exacerbation. Abx, Bds, steroids. BNP was 243  5/20 ECHO with grade II diastolic dysfunction (read as significant worse LV diastolic dysfunction). Moderate  pericardial effusion with read as early findings of tamponade. Incr R and L heart filling pressures  5/25 noted in PT notes pt progress limited by fatigue  5/26 tried CPAP overnight but felt like it was bothersome to her nose so stopped. 3% nebs added for ongoing rhonchi. Continued diuresis  5/28 pulm consulted for rhonchi.   Abx dates  Azithro - 5/19- 5/22 Rocephin 5/19 - 5/25   Interim History / Subjective:  98% on RA  Swallowing productive cough matter -- probably aspirating  Objective   Blood pressure (!) 184/75, pulse 66, temperature 98.8 F (37.1 C), temperature source Oral, resp. rate 18, height _0  (1.6 m), weight 86.2 kg, SpO2 98 %.        Intake/Output Summary (Last 24 hours) at 01/14/2022 1125 Last data filed at 01/14/2022 0500 Gross per 24 hour  Intake --  Output 1050 ml  Net -1050 ml   Filed Weights   01/12/22 0500 01/13/22 0500 01/14/22 0500  Weight: 80.9 kg 80.9 kg 86.2 kg    Examination: General: Elderly f NAD HENT: NCAT  Lungs: Shallow respirations. Posterior bibasilar crackles. Scattered upper lobe rhonchi  Cardiovascular: cap refill < 3 sec  Abdomen: soft ndnt  Extremities: no acute joint deformity  Neuro: Awake, interactive GU: defer   Resolved Hospital Problem list     Assessment & Plan:   Chest congestion due to- CAP, s/p abx Physical deconditioning  Suspected aspiration  ? Small amount of pulm edema ? Asthma exacerbation Pt is feeling overall improvement after  abx course, and interval improvement with chest congestion after CPT. I think the pts ongoing congestion is multifactorial. She is deconditioned. Very small Vt on IS, poor mobility. When she (or secretions) is mobilized (CPT, flutter, IS) she does not consistently spit out what she coughs up and instead swallows much of it. I suspect she might be aspirating some of this. Caregiver states that at baseline she regularly  'chokes a little' when she eats/drinks at home. P -continue  mobility efforts. Independent of working with PT, would do bed in chair position, OOB up to chair, seated upright at side of bed -- with assistance. -Need to continue doing IS, flutter. Provided some coaching on how to do IS. Anticipate she will need continued guidance.  -Cont CPT.  -cont mucinex -think its fine to try hypertonic nebs but the secretions she does cough up aren't particularly tenacious -Do not think she needs more abx at this time.  -will order aspiration precautions and SLP consult -dispo planning per primary     Best Practice (right click and "Reselect all SmartList Selections" daily)   Per primary   Labs   CBC: Recent Labs  Lab 01/10/22 0349 01/11/22 0258 01/12/22 0626 01/13/22 0139 01/14/22 0123  WBC 6.4 9.2 6.9 9.1 10.7*  NEUTROABS  --   --   --   --  8.5*  HGB 9.9* 9.8* 9.7* 9.8* 10.1*  HCT 30.7* 31.0* 30.6* 30.9* 31.7*  MCV 88.0 88.3 90.0 89.3 89.0  PLT 158 187 172 211 696    Basic Metabolic Panel: Recent Labs  Lab 01/08/22 0411 01/09/22 0117 01/10/22 0349 01/11/22 0258 01/12/22 0626 01/13/22 0139 01/14/22 0123  NA 139 136 135 135 139 137 137  K 4.1 4.8 5.1 4.1 4.1 4.5 4.5  CL 106 103 101 101 103 101 103  CO2 _0 GLUCOSE 188* 341* 259* 72 153* 203* 143*  BUN 37* 39* 36* 45* 37* 40* 39*  CREATININE 1.60* 1.48* 1.36* 1.40* 1.31* 1.37* 1.41*  CALCIUM 8.2* 8.3* 8.4* 8.5* 8.4* 8.6* 8.5*  MG 2.1 1.9 1.9 1.8 1.7  --   --    GFR: Estimated Creatinine Clearance: 27.6 mL/min (A) (by C-G formula based on SCr of 1.41 mg/dL (H)). Recent Labs  Lab 01/10/22 0349 01/11/22 0258 01/12/22 0626 01/13/22 0139 01/14/22 0123  PROCALCITON <0.10 <0.10  --   --   --   WBC 6.4 9.2 6.9 9.1 10.7*    Liver Function Tests: Recent Labs  Lab 01/10/22 0349 01/11/22 0258 01/12/22 0626  AST _1 ALT _2 ALKPHOS 54 54 50  BILITOT 0.3 0.2* 0.5  PROT 4.9* 4.8* 4.6*  ALBUMIN 2.5* 2.7* 2.6*   No results for input(s): LIPASE,  AMYLASE in the last 168 hours. No results for input(s): AMMONIA in the last 168 hours.  ABG    Component Value Date/Time   HCO3 27.2 01/06/2022 0232   TCO2 30 08/29/2021 1606   O2SAT 92.2 01/06/2022 0232     Coagulation Profile: No results for input(s): INR, PROTIME in the last 168 hours.  Cardiac Enzymes: No results for input(s): CKTOTAL, CKMB, CKMBINDEX, TROPONINI in the last 168 hours.  HbA1C: Hgb A1c MFr Bld  Date/Time Value Ref Range Status  12/25/2021 10:46 AM 6.8 (H) <5.7 % of total Hgb Final    Comment:    For someone without known diabetes, a hemoglobin A1c value of 6.5% or greater indicates that they may have  diabetes and this should be confirmed with a follow-up  test. . For someone with known diabetes, a value <7% indicates  that their diabetes is well controlled and a value  greater than or equal to 7% indicates suboptimal  control. A1c targets should be individualized based on  duration of diabetes, age, comorbid conditions, and  other considerations. . Currently, no consensus exists regarding use of hemoglobin A1c for diagnosis of diabetes for children. Marland Kitchen   10/27/2021 12:21 PM 7.1 (H) <5.7 % of total Hgb Final    Comment:    For someone without known diabetes, a hemoglobin A1c value of 6.5% or greater indicates that they may have  diabetes and this should be confirmed with a follow-up  test. . For someone with known diabetes, a value <7% indicates  that their diabetes is well controlled and a value  greater than or equal to 7% indicates suboptimal  control. A1c targets should be individualized based on  duration of diabetes, age, comorbid conditions, and  other considerations. . Currently, no consensus exists regarding use of hemoglobin A1c for diagnosis of diabetes for children. .     CBG: Recent Labs  Lab 01/13/22 0722 01/13/22 1148 01/13/22 1636 01/13/22 2138 01/14/22 0742  GLUCAP 183* 202* 150* 75 164*    Review of Systems:    Review of Systems  Constitutional: Negative.   HENT: Negative.    Eyes: Negative.   Respiratory:  Positive for cough and sputum production. Negative for shortness of breath.   Cardiovascular: Negative.   Gastrointestinal: Negative.   Genitourinary: Negative.   Musculoskeletal: Negative.   Skin: Negative.   Neurological: Negative.   Endo/Heme/Allergies: Negative.   Psychiatric/Behavioral: Negative.      Past Medical History:  She,  has a past medical history of Angioedema, Bronchopneumonia (12/11/2016), Degenerative disc disease, Diabetes mellitus, Hyperlipidemia, Hypertension, LVH (left ventricular hypertrophy), Nasal pruritis, Osteoarthritis, Osteopenia, Renal insufficiency, Sleep apnea, Stroke ([place]) (05/2010), and Vitamin B 12 deficiency (04/08).   Surgical History:   Past Surgical History:  Procedure Laterality Date   ABDOMINAL HYSTERECTOMY     BSO-fibroids   APPENDECTOMY     BACK SURGERY     COLON SURGERY     due to punctured intestines   EYE SURGERY     cataract extraction   IR THORACENTESIS ASP PLEURAL SPACE W/IMG GUIDE  06/07/2020   KNEE SURGERY     arthroscope   PARS PLANA VITRECTOMY  07/31/2011   Procedure: PARS PLANA VITRECTOMY WITH 25 GAUGE;  Surgeon: [name], MD;  Location: [facility];  Service: Ophthalmology;  Laterality: Right;  REMOVAL OF SILICONE OIL AND LASER RIGHT EYE   RETINAL DETACHMENT SURGERY  02/18/11   times 2   SPINE SURGERY  08/09   spinal decompression surgery     Social History:   reports that she has never smoked. She has never used smokeless tobacco. She reports that she does not drink alcohol and does not use drugs.   Family History:  Her family history includes COPD in her brother; Cancer in her sister; Heart disease in her sister.   Allergies Allergies  Allergen Reactions   Tape Other (See Comments)    The patient's skin is VERY THIN- will TEAR EASILY!!   Voltaren [Diclofenac] Swelling and Other (See Comments)    Angioedema      Home Medications  Prior to Admission medications   Medication Sig Start Date End Date Taking? Authorizing Provider  acetaminophen (TYLENOL) 500 MG tablet Take 1,000  mg by mouth at bedtime.   Yes [provider]  albuterol (VENTOLIN HFA) 108 (90 Base) MCG/ACT inhaler INHALE 2 PUFFS INTO THE LUNGS EVERY 4 HOURS AS NEEDED FOR WHEEZING/SHORTNESS OF BREATH Patient taking differently: Inhale 2 puffs into the lungs every 4 (four) hours as needed for shortness of breath or wheezing. 10/21/20  Yes Tower, Wynelle Fanny, MD  ALPRAZolam [name]) 0.5 MG tablet Take 1 tablet (0.5 mg total) by mouth 2 (two) times daily. TAKE 1/2 TO 1 TABLET BY MOUTH TWICE A DAY AS NEEDED FOR ANXIETY Strength: 0.5 mg Patient taking differently: Take 0.5 mg by mouth 2 (two) times daily as needed for anxiety. 09/01/21  Yes Vann, Jessica U, DO  amLODipine (NORVASC) 10 MG tablet TAKE 1 TABLET BY MOUTH EVERY DAY Patient taking differently: Take 10 mg by mouth daily. 11/14/21  Yes [name], MD  aspirin EC 325 MG tablet Take 325 mg by mouth in the morning.   Yes [provider]  calcium-vitamin D (OSCAL WITH D) 500-200 MG-UNIT per tablet Take 1 tablet by mouth daily.   Yes [provider]  Cholecalciferol (VITAMIN D-3) 25 MCG (1000 UT) CAPS Take 1,000 Units by mouth in the morning.   Yes [provider]  Cranberry 500 MG TABS Take 500 mg by mouth in the morning and at bedtime.   Yes [provider]  docusate sodium (COLACE) 100 MG capsule Take 200 mg by mouth at bedtime.   Yes [provider]  estradiol (ESTRACE) 0.1 MG/GM vaginal cream Place 1 g vaginally daily. 04/25/21  Yes [provider]  FLONASE 50 MCG/ACT nasal spray Place 2 sprays into both nostrils See admin instructions. Instill 2 sprays into each nostril at bedtime   Yes [provider]  furosemide (LASIX) 20 MG tablet TAKE 1/2 TABLET BY MOUTH EVERY DAY Patient taking differently: Take 10 mg by mouth  daily. 12/11/21  Yes Sood, Elisabeth Cara, MD  glipiZIDE (GLUCOTROL XL) 5 MG 24 hr tablet TAKE 1 TABLET BY MOUTH EVERY DAY WITH BREAKFAST Patient taking differently: Take 5 mg by mouth daily with breakfast. 12/11/21  Yes [name], MD  hydrALAZINE (APRESOLINE) 100 MG tablet Take 1 tablet (100 mg total) by mouth 3 (three) times daily. 09/28/20  Yes [name], MD  hydrochlorothiazide (MICROZIDE) 12.5 MG capsule TAKE 1 CAPSULE BY MOUTH EVERY DAY Patient taking differently: Take 12.5 mg by mouth daily. 12/11/21  Yes Pickard, Cammie Mcgee, MD  JANUVIA 100 MG tablet TAKE 1 TABLET BY MOUTH EVERY DAY Patient taking differently: Take 100 mg by mouth daily. 11/20/21  Yes [name], MD  metoprolol succinate (TOPROL-XL) 25 MG 24 hr tablet Take 12.5 mg by mouth 2 (two) times daily. 09/16/21  Yes [provider]  mirtazapine (REMERON) 15 MG tablet Take 1 tablet (15 mg total) by mouth at bedtime. 05/26/21  Yes [name], MD  Multiple Vitamin (MULTIVITAMIN) capsule Take 1 capsule by mouth daily.   Yes [provider]  Multiple Vitamins-Minerals (PRESERVISION AREDS 2+MULTI VIT) CAPS Take 1 capsule by mouth in the morning and at bedtime.   Yes [provider]  Olopatadine HCl (PATADAY OP) Place 1 drop into both eyes 2 (two) times daily as needed (for itching or redness).   Yes [provider]  pioglitazone (ACTOS) 30 MG tablet Take 1 tablet (30 mg total) by mouth daily. 09/21/21  Yes [name], MD  sertraline (ZOLOFT) 50 MG tablet Take 1 tablet (50 mg  total) by mouth daily. 12/11/21  Yes [name], MD  solifenacin (VESICARE) 10 MG tablet Take 1 tablet (10 mg total) by mouth daily. 12/25/21  Yes [name], MD  trandolapril (MAVIK) 4 MG tablet TAKE 1 TABLET DAILY Patient taking differently: Take 4 mg by mouth daily. 10/30/21  Yes [name], MD  triamcinolone (NASACORT) 55 MCG/ACT AERO nasal inhaler Place 2 sprays into the nose daily. Patient  taking differently: Place 2 sprays into the nose See admin instructions. Instill 2 sprays into each nostril at bedtime when not using Flonase 03/28/20  Yes [name], MD  vitamin B-12 (CYANOCOBALAMIN) 1000 MCG tablet Take 1,000 mcg by mouth daily.    Yes [provider]  Blood Glucose Monitoring Suppl (ONE TOUCH ULTRA 2) w/Device KIT Check blood sugar once daily and as directed. Dx E11.9 08/27/18   Tower, Wynelle Fanny, MD  cephALEXin (KEFLEX) 500 MG capsule TAKE 1 CAPSULE (500 MG TOTAL) BY MOUTH DAILY 01/11/22   [name], MD  Continuous Blood Gluc Receiver (FREESTYLE LIBRE 2 READER) DEVI 1 each by Does not apply route as directed. 10/27/21   [name], MD  Continuous Blood Gluc Sensor (FREESTYLE LIBRE 2 SENSOR) MISC 1 each by Does not apply route as directed. 10/27/21   [name], MD  glucose blood (ONETOUCH ULTRA) test strip CHECK BLOOD SUGAR ONCE DAILY AND AS DIRECTED. 10/18/21   [name], MD  Lancets [facility] DELICA PLUS [id number]) MISC CHECK BLOOD SUGAR ONCE DAILY AND AS DIRECTED 08/01/21   [name], MD     Eliseo Gum MSN, AGACNP-BC [place] for pager 01/14/2022, 11:25 AM

## 2022-01-14 NOTE — Progress Notes (Signed)
Mobility Specialist Progress Note:   01/14/22 1400  Mobility  Activity Ambulated with assistance in hallway  Level of Assistance Moderate assist, patient does 50-74%  Assistive Device Front wheel walker  Distance Ambulated (ft) 90 ft  Activity Response Tolerated well  $Mobility charge 1 Mobility   Pt with family members present upon arrival, eager for mobility session. Required modA to stand from recliner, with verbal cues for hand placement. MinA required with gait, pt with flexed posture throughout despite max cues. HR high 90s, SpO2 93% on RA. Pt back in chair with chair alarm on.   [facility] Numbers Acute Rehab Secure Chat or Office Phone: [phone]

## 2022-01-14 NOTE — Evaluation (Signed)
Clinical/Bedside Swallow Evaluation Patient Details  Name: Renee [name] MRN: [id number] Date of Birth: [birth year]  Today's Date: 01/14/2022 Time: SLP Start Time (ACUTE ONLY): 1500 SLP Stop Time (ACUTE ONLY): 1515 SLP Time Calculation (min) (ACUTE ONLY): 15 min  Past Medical History:  Past Medical History:  Diagnosis Date   Angioedema    possibly from voltaren   Bronchopneumonia 12/11/2016   Degenerative disc disease    Diabetes mellitus    type II   Hyperlipidemia    Hypertension    LVH (left ventricular hypertrophy)    and atrial enlargement by echo in past with nl EF   Nasal pruritis    Osteoarthritis    Osteopenia    Renal insufficiency    Sleep apnea    Stroke ([place]) 05/2010   Small vessel sobcortical (in [place]) with Dr Leonie Man, residual L hemiparesis   Vitamin B 12 deficiency 04/08   Past Surgical History:  Past Surgical History:  Procedure Laterality Date   ABDOMINAL HYSTERECTOMY     BSO-fibroids   APPENDECTOMY     BACK SURGERY     COLON SURGERY     due to punctured intestines   EYE SURGERY     cataract extraction   IR THORACENTESIS ASP PLEURAL SPACE W/IMG GUIDE  06/07/2020   KNEE SURGERY     arthroscope   PARS PLANA VITRECTOMY  07/31/2011   Procedure: PARS PLANA VITRECTOMY WITH 25 GAUGE;  Surgeon: [name], MD;  Location: [facility];  Service: Ophthalmology;  Laterality: Right;  REMOVAL OF SILICONE OIL AND LASER RIGHT EYE   RETINAL DETACHMENT SURGERY  02/18/11   times 2   SPINE SURGERY  08/09   spinal decompression surgery   HPI:  This is a 88 year old female, presented with concern for community-acquired pneumonia and asthma exacerbation. Family has reported some cohoking when asked by pulmonology team. Pt with past medical history of heart failure with preserved ejection fraction, asthma, CKD 3B, type 2 diabetes During this hospitalization she had a chest x-ray that had a few basilar airspace opacities and those have cleared with repeat chest x-ray  yesterday.    Assessment / Plan / Recommendation  Clinical Impression  Pt demonstrates no signs of dysphagia. In talking with pts caregiver and the pt, they both report occasional cough with PO intake, but it is not common. SLP discussed basic precautions including oral care. Encouraged also effective cough and oral expectoration.      Aspiration Risk       Diet Recommendation Regular;Thin liquid   Liquid Administration via: Cup;Straw Medication Administration: Whole meds with liquid Supervision: Patient able to self feed    Other  Recommendations      Recommendations for follow up therapy are one component of a multi-disciplinary discharge planning process, led by the attending physician.  Recommendations may be updated based on patient status, additional functional criteria and insurance authorization.  Follow up Recommendations        Assistance Recommended at Discharge    Functional Status Assessment    Frequency and Duration            Prognosis        Swallow Study   General HPI: This is a 88 year old female, presented with concern for community-acquired pneumonia and asthma exacerbation. Family has reported some cohoking when asked by pulmonology team. Pt with past medical history of heart failure with preserved ejection fraction, asthma, CKD 3B, type 2 diabetes During this hospitalization she had a chest x-ray  that had a few basilar airspace opacities and those have cleared with repeat chest x-ray yesterday. Type of Study: Bedside Swallow Evaluation Diet Prior to this Study: Regular;Thin liquids Temperature Spikes Noted: No Respiratory Status: Room air History of Recent Intubation: No Behavior/Cognition: Alert;Cooperative;Pleasant mood Oral Cavity Assessment: Within Functional Limits Oral Care Completed by SLP: No Oral Cavity - Dentition: Adequate natural dentition Vision: Functional for self-feeding Self-Feeding Abilities: Able to feed self Patient  Positioning: Upright in chair Baseline Vocal Quality: Normal Volitional Cough: Strong Volitional Swallow: Able to elicit    Oral/Motor/Sensory Function Overall Oral Motor/Sensory Function: Within functional limits   Ice Chips     Thin Liquid Thin Liquid: Within functional limits Presentation: Straw    Nectar Thick Nectar Thick Liquid: Not tested   Honey Thick Honey Thick Liquid: Not tested   Puree Puree: Within functional limits   Solid     Solid: Within functional limits      Labrina Lines, Katherene Ponto 01/14/2022,3:32 PM

## 2022-01-14 NOTE — Plan of Care (Signed)
  Problem: Education: Goal: Knowledge of General Education information will improve Description Including pain rating scale, medication(s)/side effects and non-pharmacologic comfort measures Outcome: Progressing   Problem: Health Behavior/Discharge Planning: Goal: Ability to manage health-related needs will improve Outcome: Progressing   

## 2022-01-15 DIAGNOSIS — J189 Pneumonia, unspecified organism: Secondary | ICD-10-CM | POA: Diagnosis not present

## 2022-01-15 DIAGNOSIS — N179 Acute kidney failure, unspecified: Secondary | ICD-10-CM | POA: Diagnosis not present

## 2022-01-15 DIAGNOSIS — J9601 Acute respiratory failure with hypoxia: Secondary | ICD-10-CM | POA: Diagnosis not present

## 2022-01-15 DIAGNOSIS — J301 Allergic rhinitis due to pollen: Secondary | ICD-10-CM | POA: Diagnosis not present

## 2022-01-15 LAB — CBC WITH DIFFERENTIAL/PLATELET
Abs Immature Granulocytes: 0.46 10*3/uL — ABNORMAL HIGH (ref 0.00–0.07)
Basophils Absolute: 0 10*3/uL (ref 0.0–0.1)
Basophils Relative: 0 %
Eosinophils Absolute: 0 10*3/uL (ref 0.0–0.5)
Eosinophils Relative: 0 %
HCT: 31 % — ABNORMAL LOW (ref 36.0–46.0)
Hemoglobin: 10 g/dL — ABNORMAL LOW (ref 12.0–15.0)
Immature Granulocytes: 4 %
Lymphocytes Relative: 13 %
Lymphs Abs: 1.4 10*3/uL (ref 0.7–4.0)
MCH: 28.7 pg (ref 26.0–34.0)
MCHC: 32.3 g/dL (ref 30.0–36.0)
MCV: 88.8 fL (ref 80.0–100.0)
Monocytes Absolute: 0.6 10*3/uL (ref 0.1–1.0)
Monocytes Relative: 6 %
Neutro Abs: 8.5 10*3/uL — ABNORMAL HIGH (ref 1.7–7.7)
Neutrophils Relative %: 77 %
Platelets: 221 10*3/uL (ref 150–400)
RBC: 3.49 MIL/uL — ABNORMAL LOW (ref 3.87–5.11)
RDW: 14.9 % (ref 11.5–15.5)
WBC: 11 10*3/uL — ABNORMAL HIGH (ref 4.0–10.5)
nRBC: 0 % (ref 0.0–0.2)

## 2022-01-15 LAB — GLUCOSE, CAPILLARY
Glucose-Capillary: 140 mg/dL — ABNORMAL HIGH (ref 70–99)
Glucose-Capillary: 410 mg/dL — ABNORMAL HIGH (ref 70–99)
Glucose-Capillary: 352 mg/dL — ABNORMAL HIGH (ref 70–99)
Glucose-Capillary: 146 mg/dL — ABNORMAL HIGH (ref 70–99)
Glucose-Capillary: 56 mg/dL — ABNORMAL LOW (ref 70–99)
Glucose-Capillary: 88 mg/dL (ref 70–99)

## 2022-01-15 LAB — BASIC METABOLIC PANEL
Anion gap: 7 (ref 5–15)
BUN: 39 mg/dL — ABNORMAL HIGH (ref 8–23)
CO2: 28 mmol/L (ref 22–32)
Calcium: 8.6 mg/dL — ABNORMAL LOW (ref 8.9–10.3)
Chloride: 101 mmol/L (ref 98–111)
Creatinine, Ser: 1.52 mg/dL — ABNORMAL HIGH (ref 0.44–1.00)
GFR, Estimated: 32 mL/min — ABNORMAL LOW (ref >60)
Glucose, Bld: 132 mg/dL — ABNORMAL HIGH (ref 70–99)
Potassium: 3.9 mmol/L (ref 3.5–5.1)
Sodium: 136 mmol/L (ref 135–145)

## 2022-01-15 MED ORDER — INSULIN ASPART 100 UNIT/ML IJ SOLN
22.0000 [IU] | Freq: Once | INTRAMUSCULAR | Status: AC
Start: 2022-01-15 — End: 2022-01-15
  Administered 2022-01-15: 22 [IU] via SUBCUTANEOUS

## 2022-01-15 NOTE — Plan of Care (Signed)
  Problem: Health Behavior/Discharge Planning: Goal: Ability to manage health-related needs will improve Outcome: Progressing   Problem: Clinical Measurements: Goal: Respiratory complications will improve Outcome: Progressing   Problem: Activity: Goal: Risk for activity intolerance will decrease Outcome: Progressing   Problem: Nutrition: Goal: Adequate nutrition will be maintained Outcome: Progressing   Problem: Coping: Goal: Level of anxiety will decrease Outcome: Progressing   Problem: Pain Managment: Goal: General experience of comfort will improve Outcome: Progressing

## 2022-01-15 NOTE — Progress Notes (Signed)
Occupational Therapy Treatment Patient Details Name: Renee [name] MRN: [id number] DOB: 01/27/1929 Today's Date: 01/15/2022   History of present illness Renee [name] is a 88 y.o. female who presented with geenralized weakness ongoing for 3 days. Pt admitted for  severe sepsis due to community-acquired pneumonia . Pt with medical history significant for type 2 diabetes mellitus, mild intermittent asthma, essential pretension, allergic rhinitis, chronic diastolic heart failure.   OT comments  Pt with incremental progress towards established OT goals. Pt's caregiver, [name], present and engaged throughout session. Pt continues to require Min A for initial sit to stand transfers but progressing mobility to min guard using RW. Pt requires light assist for toileting tasks and LB dressing tasks during session with [name] able to provide this assist at DC. Educated re: energy conservation during ADLs, use of BSC over toilet to increase independence with sit to stand transfers. Pt's caregiver declines need for wheelchair or [facility] services at DC - recommendations updated to reflect.  SpO2 >96% on RA   Recommendations for follow up therapy are one component of a multi-disciplinary discharge planning process, led by the attending physician.  Recommendations may be updated based on patient status, additional functional criteria and insurance authorization.    Follow Up Recommendations  No OT follow up    Assistance Recommended at Discharge Frequent or constant Supervision/Assistance  Patient can return home with the following  A little help with walking and/or transfers;A lot of help with bathing/dressing/bathroom;Assistance with cooking/housework;Assist for transportation;Help with stairs or ramp for entrance;Direct supervision/assist for financial management;Direct supervision/assist for medications management   Equipment Recommendations  None recommended by OT    Recommendations for Other Services       Precautions / Restrictions Precautions Precautions: Fall Precaution Comments: monitor O2 Restrictions Weight Bearing Restrictions: No       Mobility Bed Mobility               General bed mobility comments: up in chair on entry    Transfers Overall transfer level: Needs assistance Equipment used: Rolling walker (2 wheels) Transfers: Sit to/from Stand Sit to Stand: Min assist           General transfer comment: Min A to stand pushing from recliner armrests and BSC armrests over toilet     Balance Overall balance assessment: Needs assistance Sitting-balance support: Feet supported Sitting balance-Leahy Scale: Fair     Standing balance support: Bilateral upper extremity supported, During functional activity, Reliant on assistive device for balance Standing balance-Leahy Scale: Poor                             ADL either performed or assessed with clinical judgement   ADL Overall ADL's : Needs assistance/impaired Eating/Feeding: Set up;Sitting Eating/Feeding Details (indicate cue type and reason): though caregiver was assisting on initial entry; reports caregiver assisting due to O2 probe/lines getting in food                     Toilet Transfer: Minimal assistance;Ambulation;BSC/3in1;Regular Toilet;Rolling walker (2 wheels) Toilet Transfer Details (indicate cue type and reason): BSC over toilet with increased ease of pushing on armrests to stand, Min A near min guard to stand from toilet with this setup. min guard for mobility using RW to/from bathroom Toileting- Clothing Manipulation and Hygiene: Moderate assistance Toileting - Clothing Manipulation Details (indicate cue type and reason): assist for clothing mgmt and posterior hygiene. pt typically able to perform  hygiene though reports unable to d/t O2 probe on finger     Functional mobility during ADLs: Min guard;Rolling walker (2 wheels) General ADL Comments: Caregiver, brenda,  present - discussed ADL routine, use of BSC over toilet, energy conservation for ADLs. Caregiver reports no need for HHOT and no need for w/c as their transport chair will work ok for community mobility    Extremity/Trunk Assessment Upper Extremity Assessment Upper Extremity Assessment: LUE deficits/detail LUE Deficits / Details: grossly 3/5 mmt; weak from prior CVA LUE Sensation: WNL LUE Coordination: decreased fine motor;decreased gross motor   Lower Extremity Assessment Lower Extremity Assessment: Defer to PT evaluation        Vision   Vision Assessment?: No apparent visual deficits   Perception     Praxis      Cognition Arousal/Alertness: Awake/alert Behavior During Therapy: Flat affect Overall Cognitive Status: Impaired/Different from baseline Area of Impairment: Safety/judgement, Problem solving, Attention, Following commands                   Current Attention Level: Sustained   Following Commands: Follows one step commands with increased time, Follows multi-step commands inconsistently Safety/Judgement: Decreased awareness of deficits   Problem Solving: Slow processing, Requires verbal cues, Decreased initiation, Difficulty sequencing, Requires tactile cues General Comments: increased time for processing information        Exercises      Shoulder Instructions       General Comments      Pertinent Vitals/ Pain       Pain Assessment Pain Assessment: No/denies pain  Home Living                                          Prior Functioning/Environment              Frequency  Min 2X/week        Progress Toward Goals  OT Goals(current goals can now be found in the care plan section)  Progress towards OT goals: Progressing toward goals  Acute Rehab OT Goals Patient Stated Goal: go home tomorrow, improve mobility endurance OT Goal Formulation: With patient Time For Goal Achievement: 01/20/22 Potential to Achieve Goals:  Good ADL Goals Pt Will Perform Grooming: with set-up;sitting Pt Will Perform Lower Body Bathing: with min assist;sit to/from stand Pt Will Perform Lower Body Dressing: with min assist;sit to/from stand Pt Will Transfer to Toilet: with min assist;ambulating Additional ADL Goal #1: pt will tolerate at least 3 minutes of OOB standing functional activity with min G  Plan Discharge plan needs to be updated    Co-evaluation                 AM-PAC OT "6 Clicks" Daily Activity     Outcome Measure   Help from another person eating meals?: None Help from another person taking care of personal grooming?: A Little Help from another person toileting, which includes using toliet, bedpan, or urinal?: A Lot Help from another person bathing (including washing, rinsing, drying)?: A Little Help from another person to put on and taking off regular upper body clothing?: A Little Help from another person to put on and taking off regular lower body clothing?: A Lot 6 Click Score: 17    End of Session Equipment Utilized During Treatment: Gait belt;Rolling walker (2 wheels)  OT Visit Diagnosis: Other abnormalities of gait and mobility (R26.89);Unsteadiness on feet (  R26.81);Muscle weakness (generalized) (M62.81)   Activity Tolerance Patient tolerated treatment well   Patient Left in chair;with call bell/phone within reach;with family/visitor present   Nurse Communication Mobility status;Other (comment) (request for new purewick, O2)        Time: 3709-6438 OT Time Calculation (min): 22 min  Charges: OT General Charges $OT Visit: 1 Visit OT Treatments $Self Care/Home Management : 8-22 mins  Malachy Chamber, OTR/L Acute Rehab Services Office: [phone]   Layla Maw 01/15/2022, 10:18 AM

## 2022-01-15 NOTE — Progress Notes (Signed)
PROGRESS NOTE    Tenia Goh [place]  VKP:[id number] DOB: 1929-08-18 DOA: 01/05/2022 PCP: [name], MD    Chief Complaint  Patient presents with   Pneumonia   UTI    Brief Narrative:  PMH of type II DM, HTN, chronic HFpEF, OSA, obesity.  Presents with complaints of shortness of breath and cough found to have pneumonia as well as possible mild exacerbation of asthma.    Assessment & Plan:  Principal Problem:   Sepsis due to pneumonia [facility]) Active Problems:   CAP (community acquired pneumonia)   Acute respiratory failure with hypoxia ([place])   Acute renal failure superimposed on stage 3b chronic kidney disease (HCC)   Asthma, chronic, unspecified asthma severity, with acute exacerbation   Chronic diastolic CHF (congestive heart failure) (HCC)   Type 2 diabetes, controlled, with retinopathy ([place])   Essential hypertension   Anxiety and depression   History of CVA (cerebrovascular accident)   Generalized weakness   Allergic rhinitis   Obesity (BMI 30-39.9)    Assessment and Plan: * Sepsis due to pneumonia ([place]) Acute hypoxic respiratory failure. Mild asthma exacerbation/Pneumonia With fever, tachypnea and elevated lactic acid meets SIRS criteria.  Chest x-ray showing evidence of pneumonia with elevated procalcitonin meeting criteria for infection on admission. O2 saturation 88% on room air with tachypnea on admission.  Currently sats of 98% on room air when sitting up.  COVID-19 PCR negative, influenza A and B PCR negative. Blood cultures so far negative. -Patient with improvement with wheezing still with diffuse rhonchorous breath sounds. -Transitioned from IV steroids to oral prednisone taper. -Status post 7 days IV antibiotics.   -Continue scheduled DuoNebs, Pulmicort, Claritin, PPI, Flonase, Mucinex, 3% saline nebs, oral Lasix, flutter valve, incentive spirometry, chest vest. -Due to continuous rhonchorous breath sounds/congestion caretaker/family requesting pulmonary  input. -PCCM assessed patient and recommend continuing current regimen with mobilization.  No further work-up needed.  CAP (community acquired pneumonia) - See above.  Acute renal failure superimposed on stage 3b chronic kidney disease (HCC) Baseline serum creatinine around 1.3.  GFR more than 30 prior to admission. Serum creatinine on admission 1.8, worsened to 2.2 with improvement with IV hydration.  -Patient received IV Lasix currently on oral Lasix.   -Urine output of 900cc over the past 24 hours.  -Creatinine stable at 1.52. -Follow.  Chronic diastolic CHF (congestive heart failure) (HCC) Moderate pericardial effusion. Mildly volume overloaded early on during the hospitalization.  Chest x-ray showed evidence of vascular congestion. Echocardiogram shows preserved EF. Also shows pericardial effusion moderate without any tamponade. -Patient has been receiving IV Lasix and has been transitioned to Lasix 20 mg daily.  -Continue Lasix 20 mg daily. -Will increase home regimen Lasix from 10 mg to 20 mg daily on discharge. -Continue home regimen beta-blocker and HCTZ.  Obesity (BMI 30-39.9) OSA Not on CPAP  Body mass index is 31.52 kg/m.  Placing the pt at higher risk of poor outcomes.  Allergic rhinitis - Claritin, Flonase.  Generalized weakness - Patient seen by PT/OT. -Home health therapies ordered.  History of CVA (cerebrovascular accident) With left hemiparesis. Exam unchanged. -Continue aspirin, Monitor.  PT OT recommends home with home health.  Anxiety and depression Continue home regimen Zoloft and Xanax.  Essential hypertension -BP noted to be elevated patient started back on half home dose of Norvasc 5 mg daily as well as hydralazine 25 mg 3 times daily.   -Norvasc increased to 10 mg daily for better blood pressure control.   -Continue Lasix  20 mg daily.  -Continue home regimen HCTZ and Toprol-XL. -Follow.  Type 2 diabetes, controlled, with retinopathy  ([place]) Uncontrolled with hyperglycemia due to steroids. Hemoglobin A1c 6.8 roughly 2 weeks ago. Not on any insulin at home. -Elevated CBGs in light secondary to steroids. -Steroids being tapered.  -CBG of 140 this morning. -Continue Semglee 10 units daily, SSI, NovoLog 5 units 3 times daily with meals, SSI.         DVT prophylaxis: SCDs Code Status: DNR Family Communication: Updated patient and caretaker at bedside. Disposition: Likely home when clinically improved.  Status is: Inpatient Remains inpatient appropriate because: Severity of illness   Consultants:  None  Procedures:  Chest x-ray 01/05/2022, 01/08/2022, 01/13/2022 2D echo 01/06/2022   Antimicrobials:  Review azithromycin 01/05/2022>>> 01/06/2022 IV Rocephin 01/05/2022>>>>> 01/11/2022   Subjective: Patient sitting up in chair.  Feeling better.  Respiratory therapy at bedside.  No chest pain.  Tolerating chest vest.  States some improvement with congestion with increasing sputum production.    Objective: Vitals:   01/15/22 0916 01/15/22 0926 01/15/22 1345 01/15/22 1700  BP:   138/60 138/66  Pulse:    67  Resp:    20  Temp:    98.6 F (37 C)  TempSrc:    Oral  SpO2: 98% 99%  96%  Weight:      Height:        Intake/Output Summary (Last 24 hours) at 01/15/2022 1838 Last data filed at 01/15/2022 1300 Gross per 24 hour  Intake 480 ml  Output 250 ml  Net 230 ml   Filed Weights   01/13/22 0500 01/14/22 0500 01/15/22 0500  Weight: 80.9 kg 86.2 kg 82.2 kg    Examination:  General exam: NAD Respiratory system: Decreased rhonchorous breath sounds.  No wheezing.  Fair air movement.  Speaking in full sentences.  Cardiovascular system: RRR no murmurs rubs or gallops.  No JVD.  No lower extremity edema.  Gastrointestinal system: Abdomen is soft, nontender, nondistended, positive bowel sounds.  No rebound.  No guarding.  Central nervous system: Alert and oriented. No focal neurological deficits. Extremities:  Symmetric 5 x 5 power. Skin: No rashes, lesions or ulcers Psychiatry: Judgement and insight appear normal. Mood & affect appropriate.     Data Reviewed:   CBC: Recent Labs  Lab 01/11/22 0258 01/12/22 0626 01/13/22 0139 01/14/22 0123 01/15/22 0137  WBC 9.2 6.9 9.1 10.7* 11.0*  NEUTROABS  --   --   --  8.5* 8.5*  HGB 9.8* 9.7* 9.8* 10.1* 10.0*  HCT 31.0* 30.6* 30.9* 31.7* 31.0*  MCV 88.3 90.0 89.3 89.0 88.8  PLT 187 172 211 215 176    Basic Metabolic Panel: Recent Labs  Lab 01/09/22 0117 01/10/22 0349 01/11/22 0258 01/12/22 0626 01/13/22 0139 01/14/22 0123 01/15/22 0137  NA 136 135 135 139 137 137 136  K 4.8 5.1 4.1 4.1 4.5 4.5 3.9  CL 103 101 101 103 101 103 101  CO2 '25 29 28 27 28 27 28  '$ GLUCOSE 341* 259* 72 153* 203* 143* 132*  BUN 39* 36* 45* 37* 40* 39* 39*  CREATININE 1.48* 1.36* 1.40* 1.31* 1.37* 1.41* 1.52*  CALCIUM 8.3* 8.4* 8.5* 8.4* 8.6* 8.5* 8.6*  MG 1.9 1.9 1.8 1.7  --   --   --     GFR: Estimated Creatinine Clearance: 25 mL/min (A) (by C-G formula based on SCr of 1.52 mg/dL (H)).  Liver Function Tests: Recent Labs  Lab 01/10/22 1607 01/11/22 0258 01/12/22 3710  AST '16 21 17  '$ ALT '16 25 23  '$ ALKPHOS 54 54 50  BILITOT 0.3 0.2* 0.5  PROT 4.9* 4.8* 4.6*  ALBUMIN 2.5* 2.7* 2.6*    CBG: Recent Labs  Lab 01/14/22 2024 01/15/22 0749 01/15/22 1129 01/15/22 1333 01/15/22 1657  GLUCAP 294* 140* 410* 352* 146*     Recent Results (from the past 240 hour(s))  Resp Panel by RT-PCR (Flu A&B, Covid) Nasopharyngeal Swab     Status: None   Collection Time: 01/05/22  6:55 PM   Specimen: Nasopharyngeal Swab; Nasopharyngeal(NP) swabs in vial transport medium  Result Value Ref Range Status   SARS Coronavirus 2 by RT PCR NEGATIVE NEGATIVE Final    Comment: (NOTE) SARS-CoV-2 target nucleic acids are NOT DETECTED.  The SARS-CoV-2 RNA is generally detectable in upper respiratory specimens during the acute phase of infection. The lowest concentration  of SARS-CoV-2 viral copies this assay can detect is 138 copies/mL. A negative result does not preclude SARS-Cov-2 infection and should not be used as the sole basis for treatment or other patient management decisions. A negative result may occur with  improper specimen collection/handling, submission of specimen other than nasopharyngeal swab, presence of viral mutation(s) within the areas targeted by this assay, and inadequate number of viral copies(<138 copies/mL). A negative result must be combined with clinical observations, patient history, and epidemiological information. The expected result is Negative.  Fact Sheet for Patients:  EntrepreneurPulse.com.au  Fact Sheet for Healthcare Providers:  IncredibleEmployment.be  This test is no t yet approved or cleared by the [place] FDA and  has been authorized for detection and/or diagnosis of SARS-CoV-2 by FDA under an Emergency Use Authorization (EUA). This EUA will remain  in effect (meaning this test can be used) for the duration of the COVID-19 declaration under Section 564(b)(1) of the Act, 21 U.S.C.section 360bbb-3(b)(1), unless the authorization is terminated  or revoked sooner.       Influenza A by PCR NEGATIVE NEGATIVE Final   Influenza B by PCR NEGATIVE NEGATIVE Final    Comment: (NOTE) The Xpert Xpress SARS-CoV-2/FLU/RSV plus assay is intended as an aid in the diagnosis of influenza from Nasopharyngeal swab specimens and should not be used as a sole basis for treatment. Nasal washings and aspirates are unacceptable for Xpert Xpress SARS-CoV-2/FLU/RSV testing.  Fact Sheet for Patients: EntrepreneurPulse.com.au  Fact Sheet for Healthcare Providers: IncredibleEmployment.be  This test is not yet approved or cleared by the [place] FDA and has been authorized for detection and/or diagnosis of SARS-CoV-2 by FDA under an Emergency Use  Authorization (EUA). This EUA will remain in effect (meaning this test can be used) for the duration of the COVID-19 declaration under Section 564(b)(1) of the Act, 21 U.S.C. section 360bbb-3(b)(1), unless the authorization is terminated or revoked.  Performed at [facility], [place] [address], [place], [place] [address]   Blood culture (routine x 2)     Status: None   Collection Time: 01/05/22  7:00 PM   Specimen: BLOOD  Result Value Ref Range Status   Specimen Description BLOOD RIGHT ANTECUBITAL  Final   Special Requests   Final    BOTTLES DRAWN AEROBIC AND ANAEROBIC Blood Culture adequate volume   Culture   Final    NO GROWTH 5 DAYS Performed at [facility], [place] [address], [place], [place] [address]    Report Status 01/10/2022 FINAL  Final  Blood culture (routine x 2)     Status: None   Collection  Time: 01/05/22  7:43 PM   Specimen: BLOOD  Result Value Ref Range Status   Specimen Description BLOOD BLOOD RIGHT FOREARM  Final   Special Requests   Final    BOTTLES DRAWN AEROBIC AND ANAEROBIC Blood Culture adequate volume   Culture   Final    NO GROWTH 5 DAYS Performed at [facility], [address] [place], [place] [address]    Report Status 01/10/2022 FINAL  Final         Radiology Studies: No results found.      Scheduled Meds:  amLODipine  10 mg Oral Daily   aspirin EC  325 mg Oral Daily   benzonatate  100 mg Oral TID   budesonide (PULMICORT) nebulizer solution  0.5 mg Nebulization BID   darifenacin  15 mg Oral Daily   dextromethorphan-guaiFENesin  1 tablet Oral BID   docusate sodium  200 mg Oral QHS   fluticasone  2 spray Each Nare QHS   furosemide  20 mg Oral Daily   hydrALAZINE  25 mg Oral Q8H   hydrochlorothiazide  12.5 mg Oral Daily   insulin aspart  0-20 Units Subcutaneous TID WC   insulin aspart  0-5 Units Subcutaneous QHS   insulin aspart  5 Units Subcutaneous TID WC   insulin glargine-yfgn  10 Units Subcutaneous Daily    ipratropium-albuterol  3 mL Nebulization BID   loratadine  10 mg Oral Daily   mouth rinse  15 mL Mouth Rinse BID   melatonin  3 mg Oral QHS   metoprolol succinate  12.5 mg Oral BID   pantoprazole  40 mg Oral Q0600   predniSONE  30 mg Oral QAC breakfast   sertraline  50 mg Oral Daily   sodium chloride HYPERTONIC  4 mL Nebulization BID   vitamin B-12  1,000 mcg Oral Daily   Continuous Infusions:     LOS: 10 days    Time spent: 35-minute    Irine Seal, MD [facility]   To contact the attending provider between 7A-7P or the covering provider during after hours 7P-7A, please log into the web site [email or web address] and access using universal [place] password for that web site. If you do not have the password, please call the hospital operator.  01/15/2022, 6:38 PM

## 2022-01-15 NOTE — Progress Notes (Signed)
OT Cancellation Note  Patient Details Name: DEYANNA MCTIER MRN: [id number] DOB: [birth year]   Cancelled Treatment:    Reason Eval/Treat Not Completed: Other (comment) Still working on breakfast tray. Will follow up for OT session.  Layla Maw 01/15/2022, 9:18 AM

## 2022-01-15 NOTE — Progress Notes (Signed)
Physical Therapy Treatment Patient Details Name: Renee [name] MRN: [id number] DOB: April 04, 1929 Today's Date: 01/15/2022   History of Present Illness Renee [name] is a 88 y.o. female who presented with geenralized weakness ongoing for 3 days. Pt admitted for  severe sepsis due to community-acquired pneumonia . Pt with medical history significant for type 2 diabetes mellitus, mild intermittent asthma, essential pretension, allergic rhinitis, chronic diastolic heart failure.    PT Comments    Pt admitted with above diagnosis. Pt progressing ambulate distance as well as caregiver reports her posture seems better as well. Pt needs cues and min to min guard assist but is making progress. Will continue PT.   Pt currently with functional limitations due to balance and endurance deficits. Pt will benefit from skilled PT to increase their independence and safety with mobility to allow discharge to the venue listed below.      Recommendations for follow up therapy are one component of a multi-disciplinary discharge planning process, led by the attending physician.  Recommendations may be updated based on patient status, additional functional criteria and insurance authorization.  Follow Up Recommendations  Home health PT     Assistance Recommended at Discharge Frequent or constant Supervision/Assistance  Patient can return home with the following A little help with walking and/or transfers;A little help with bathing/dressing/bathroom;Assistance with cooking/housework;Assist for transportation;Help with stairs or ramp for entrance   Equipment Recommendations  None recommended by PT    Recommendations for Other Services       Precautions / Restrictions Precautions Precautions: Fall Precaution Comments: monitor O2 Restrictions Weight Bearing Restrictions: No     Mobility  Bed Mobility Overal bed mobility: Needs Assistance         Sit to supine: Min assist   General bed mobility  comments: up in chair on entry.  Min assist to assist pt back into bed    Transfers Overall transfer level: Needs assistance Equipment used: Rolling walker (2 wheels) Transfers: Sit to/from Stand Sit to Stand: Min assist           General transfer comment: Min A to stand for power up pushing from recliner armrests with cues for hand placement    Ambulation/Gait Ambulation/Gait assistance: Min guard, Min assist Gait Distance (Feet): 110 Feet Assistive device: Rolling walker (2 wheels) Gait Pattern/deviations: Step-to pattern, Decreased step length - left, Shuffle, Decreased stride length, Trunk flexed, Decreased weight shift to left Gait velocity: decreased Gait velocity interpretation: <1.31 ft/sec, indicative of household ambulator   General Gait Details: B LE weakness left worse than right, min guard to min assist as pt fatigues, cues for staying close to walker and upright posture.   Stairs             Wheelchair Mobility    Modified Rankin (Stroke Patients Only)       Balance Overall balance assessment: Needs assistance Sitting-balance support: Feet supported Sitting balance-Leahy Scale: Fair     Standing balance support: Bilateral upper extremity supported, During functional activity, Reliant on assistive device for balance Standing balance-Leahy Scale: Poor Standing balance comment: relies on UE support                            Cognition Arousal/Alertness: Awake/alert Behavior During Therapy: Flat affect Overall Cognitive Status: Impaired/Different from baseline Area of Impairment: Safety/judgement, Problem solving, Attention, Following commands  Current Attention Level: Sustained   Following Commands: Follows one step commands with increased time, Follows multi-step commands inconsistently Safety/Judgement: Decreased awareness of deficits   Problem Solving: Slow processing, Requires verbal cues, Decreased  initiation, Difficulty sequencing, Requires tactile cues General Comments: increased time for processing information        Exercises General Exercises - Lower Extremity Ankle Circles/Pumps: Both, 10 reps, Seated    General Comments General comments (skin integrity, edema, etc.): Pt 92% and > on RA, other VSS      Pertinent Vitals/Pain Pain Assessment Pain Assessment: No/denies pain    Home Living                          Prior Function            PT Goals (current goals can now be found in the care plan section) Acute Rehab PT Goals Patient Stated Goal: to go home Progress towards PT goals: Progressing toward goals    Frequency    Min 3X/week      PT Plan Current plan remains appropriate    Co-evaluation              AM-PAC PT "6 Clicks" Mobility   Outcome Measure  Help needed turning from your back to your side while in a flat bed without using bedrails?: A Little Help needed moving from lying on your back to sitting on the side of a flat bed without using bedrails?: A Little Help needed moving to and from a bed to a chair (including a wheelchair)?: A Little Help needed standing up from a chair using your arms (e.g., wheelchair or bedside chair)?: A Little Help needed to walk in hospital room?: A Little Help needed climbing 3-5 steps with a railing? : A Lot 6 Click Score: 17    End of Session Equipment Utilized During Treatment: Gait belt Activity Tolerance: Patient tolerated treatment well Patient left: with call bell/phone within reach;with family/visitor present;in bed Nurse Communication: Mobility status PT Visit Diagnosis: Unsteadiness on feet (R26.81);Muscle weakness (generalized) (M62.81);Difficulty in walking, not elsewhere classified (R26.2)     Time: 9233-0076 PT Time Calculation (min) (ACUTE ONLY): 20 min  Charges:  $Gait Training: 8-22 mins                     Renee [name] M,PT Acute Rehab Services [phone]  (pager)    Renee [name] 01/15/2022, 2:36 PM

## 2022-01-15 NOTE — Assessment & Plan Note (Signed)
-   Patient seen by PT/OT. -Home health therapies ordered.

## 2022-01-15 NOTE — Progress Notes (Signed)
Hypoglycemic Event  CBG: 56  Treatment: 4 oz juice/soda  Symptoms: None  Follow-up CBG: Time:2105 CBG Result:88  Possible Reasons for Event: Unknown  Comments/MD notified:    Ajla Mcgeachy, Blondell Reveal

## 2022-01-16 ENCOUNTER — Other Ambulatory Visit (HOSPITAL_COMMUNITY): Payer: Self-pay

## 2022-01-16 DIAGNOSIS — J301 Allergic rhinitis due to pollen: Secondary | ICD-10-CM | POA: Diagnosis not present

## 2022-01-16 DIAGNOSIS — J9601 Acute respiratory failure with hypoxia: Secondary | ICD-10-CM | POA: Diagnosis not present

## 2022-01-16 DIAGNOSIS — A403 Sepsis due to Streptococcus pneumoniae: Secondary | ICD-10-CM | POA: Diagnosis not present

## 2022-01-16 DIAGNOSIS — J189 Pneumonia, unspecified organism: Secondary | ICD-10-CM | POA: Diagnosis not present

## 2022-01-16 DIAGNOSIS — N179 Acute kidney failure, unspecified: Secondary | ICD-10-CM | POA: Diagnosis not present

## 2022-01-16 LAB — GLUCOSE, CAPILLARY: Glucose-Capillary: 106 mg/dL — ABNORMAL HIGH (ref 70–99)

## 2022-01-16 MED ORDER — PREDNISONE 20 MG PO TABS
30.0000 mg | ORAL_TABLET | Freq: Every day | ORAL | 0 refills | Status: AC
  Filled 2022-01-16: qty 5, 3d supply, fill #0

## 2022-01-16 MED ORDER — DM-GUAIFENESIN ER 30-600 MG PO TB12
1.0000 | ORAL_TABLET | Freq: Two times a day (BID) | ORAL | 0 refills | Status: AC
  Filled 2022-01-16: qty 10, 5d supply, fill #0

## 2022-01-16 MED ORDER — ALBUTEROL SULFATE HFA 108 (90 BASE) MCG/ACT IN AERS
INHALATION_SPRAY | RESPIRATORY_TRACT | 1 refills | Status: DC
  Filled 2022-01-16: qty 18, 15d supply, fill #0

## 2022-01-16 MED ORDER — BUDESONIDE-FORMOTEROL FUMARATE 160-4.5 MCG/ACT IN AERO
2.0000 | INHALATION_SPRAY | Freq: Two times a day (BID) | RESPIRATORY_TRACT | 1 refills | Status: DC
  Filled 2022-01-16: qty 10.2, 30d supply, fill #0

## 2022-01-16 MED ORDER — HYDRALAZINE HCL 25 MG PO TABS
25.0000 mg | ORAL_TABLET | Freq: Three times a day (TID) | ORAL | 1 refills | Status: DC
  Filled 2022-01-16: qty 90, 30d supply, fill #0

## 2022-01-16 MED ORDER — FUROSEMIDE 20 MG PO TABS
20.0000 mg | ORAL_TABLET | Freq: Every day | ORAL | 1 refills | Status: DC
  Filled 2022-01-16: qty 30, 30d supply, fill #0

## 2022-01-16 MED ORDER — LORATADINE 10 MG PO TABS
10.0000 mg | ORAL_TABLET | Freq: Every day | ORAL | 1 refills | Status: DC
  Filled 2022-01-16: qty 30, 30d supply, fill #0

## 2022-01-16 MED ORDER — BENZONATATE 100 MG PO CAPS
100.0000 mg | ORAL_CAPSULE | Freq: Three times a day (TID) | ORAL | 0 refills | Status: AC
  Filled 2022-01-16: qty 15, 5d supply, fill #0

## 2022-01-16 MED ORDER — DULERA 200-5 MCG/ACT IN AERO
2.0000 | INHALATION_SPRAY | Freq: Two times a day (BID) | RESPIRATORY_TRACT | 0 refills | Status: DC
  Filled 2022-01-16: qty 13, 30d supply, fill #0

## 2022-01-16 MED ORDER — PANTOPRAZOLE SODIUM 40 MG PO TBEC
40.0000 mg | DELAYED_RELEASE_TABLET | Freq: Every day | ORAL | 1 refills | Status: DC
  Filled 2022-01-16: qty 30, 30d supply, fill #0

## 2022-01-16 NOTE — Discharge Summary (Addendum)
Physician Discharge Summary  Renee [name] GDJ:[id number] DOB: [birth year] DOA: 01/05/2022  PCP: [name], MD  Admit date: 01/05/2022 Discharge date: 01/16/2022  Time spent: 55 minutes  Recommendations for Outpatient Follow-up:  Follow-up with [name], MD in 1 week.  On follow-up patient blood pressure need to be reassessed.  Patient will need a basic metabolic profile done to follow-up on electrolytes and renal function.  Patient's diabetes will need to be followed up upon. Follow-up with Dr. Valeta Harms, pulmonary in 2 to 3 weeks.  Patient will need to be evaluated for sleep/OSA.  Patient's respiratory issues will need to be followed up upon.   Discharge Diagnoses:  Principal Problem:   Sepsis due to pneumonia [facility]) Active Problems:   CAP (community acquired pneumonia)   Acute respiratory failure with hypoxia ([place])   Acute renal failure superimposed on stage 3b chronic kidney disease (HCC)   Asthma, chronic, unspecified asthma severity, with acute exacerbation   Chronic diastolic CHF (congestive heart failure) (HCC)   Type 2 diabetes, controlled, with retinopathy ([place])   Essential hypertension   Anxiety and depression   History of CVA (cerebrovascular accident)   Generalized weakness   Allergic rhinitis   Obesity (BMI 30-39.9)   Discharge Condition: Stable and improved  Diet recommendation: Heart healthy  Filed Weights   01/13/22 0500 01/14/22 0500 01/15/22 0500  Weight: 80.9 kg 86.2 kg 82.2 kg    History of present illness:  HPI per Dr.Howerter Renee [name] is a 88 y.o. female with medical history significant for type 2 diabetes mellitus, mild intermittent asthma, essential pretension, allergic rhinitis, chronic diastolic heart failure, who is admitted to [facility] on 01/05/2022 with severe sepsis due to community-acquired pneumonia after presenting from home to [facility] ED complaining of generalized weakness.    The patient conveys to 3 days of  generalized weakness in the absence of any associated acute focal weakness, acute focal numbness, paresthesias, facial droop, slurred speech, expressive aphasia, acute change in vision, dysphagia, vertigo.  She notes that this has been associated with subjective fever/chills in the absence of full body rigors or generalized myalgias.  She also reports new onset cough over that timeframe, without any associated hemoptysis.  Reports associated mild shortness of breath, wheezing, in the absence of any associated orthopnea, PND, or worsening of peripheral edema.  No recent calf tenderness or new onset lower extremity erythema.  Not associate any chest pain, palpitations, diaphoresis, dizziness, presyncope, syncope, nausea, vomiting, diarrhea, abdominal pain, rash.   Has a documented history of mild intermittent asthma, without any known baseline supplemental oxygen requirements.   She also has a documented history of chronic diastolic heart failure, with most recent echocardiogram performed in October 2021 demonstrating LVEF 65 to 70%, no focal wall motion abnormalities, while showing evidence of grade 1 diastolic dysfunction, normal right ventricular systolic function, mildly dilated left atrium, and no evidence of significant valvular pathology.   Per chart review, most recent prior serum creatinine data point appears to be 1.59 on 12/25/2021.       ED Course:  Vital signs in the ED were notable for the following: Temperature max 101.1; heart rate 71-87; blood pressure 108/47 - 155/65; respiratory rate 21-20, oxygen saturation initially noted to be 89% on room air, with ensuing improvement to 93-95% on 2 L nasal cannula.   Labs were notable for the following: CMP notable for the following: Sodium 139, bicarbonate 26, creatinine 1.82 with associated BUN/creatinine ratio 17.6, glucose 351, and  liver enzymes within normal limits.  High-sensitivity troponin I x2 were both found to be 10.  BNP 243 compared to  most recent prior value of 90 in January 2023.  CBC notable for white cell count 9900, hemoglobin 10.6 compared to 11.5 on 12/25/2021.  Initial lactate 2.8, with repeat value trending down to 2.1.  Urinalysis ordered, with result currently pending.  COVID-19/influenza PCR negative.  Blood cultures x2 were collected prior to initiation of IV antibiotics, as further detailed below.   Imaging and additional notable ED work-up: EKG shows sinus rhythm with left anterior fascicular block, heart rate 79, no evidence of T wave or ST changes, including no evidence of ST elevation.  Chest x-ray shows evidence of left basilar airspace opacity concerning for pneumonia without evidence of edema or pneumothorax.   While in the ED, the following were administered: Duo nebulizer, Solu-Medrol 125 mg IV x1, Rocephin, azithromycin.   Subsequently, the patient was admitted for further evaluation and management of severe sepsis due to community-acquired pneumonia complicated by acute asthma exacerbation and acute hypoxic respiratory distress after presenting with generalized weakness, with presenting labs also notable for acute kidney injury.     Hospital Course:   Assessment and Plan: * Sepsis due to pneumonia ([place]) Acute hypoxic respiratory failure. Mild asthma exacerbation/Pneumonia With fever, tachypnea and elevated lactic acid meets SIRS criteria.  Chest x-ray showing evidence of pneumonia with elevated procalcitonin meeting criteria for infection on admission. O2 saturation 88% on room air with tachypnea on admission.  Patient improved clinically such that by day of discharge patient with sats of 97% on room air when sitting up.  COVID-19 PCR negative, influenza A and B PCR negative. Blood cultures negative. -Patient with improvement with wheezing however noted to have some diffuse rhonchorous breath sounds.   -Patient was on IV steroids and transition to oral prednisone taper which she tolerated. -Status post 7  days IV antibiotics.   -Patient placed on  scheduled DuoNebs, Pulmicort, Claritin, PPI, Flonase, Mucinex, 3% saline nebs, oral Lasix, flutter valve, incentive spirometry, chest vest. -Due to continuous rhonchorous breath sounds/congestion caretaker/family requested pulmonary input. -PCCM assessed patient and recommend continuing current regimen with mobilization.  No further work-up needed. -Patient ordered nocturnal oxygenation and will need evaluation for sleep in the outpatient setting. -Outpatient follow-up with PCP and pulmonary.  Acute respiratory failure with hypoxia (HCC) - See above.  CAP (community acquired pneumonia) - See above.  Acute renal failure superimposed on stage 3b chronic kidney disease (HCC) Baseline serum creatinine around 1.3.  GFR more than 30 prior to admission. Serum creatinine on admission 1.8, worsened to 2.2 with improvement with IV hydration. -Patient received IV Lasix and subsequently transition to oral Lasix with good urine output. -Creatinine stabilized at 1.52. -Patient was discharged home on increased dose of Lasix will need close outpatient follow-up with PCP for repeat labs 1 week postdischarge. -Follow.  Chronic diastolic CHF (congestive heart failure) (HCC) Moderate pericardial effusion. Mildly volume overloaded early on during the hospitalization.  Chest x-ray showed evidence of vascular congestion. Echocardiogram shows preserved EF. Also shows pericardial effusion moderate without any tamponade. -Patient received IV Lasix and was subsequently transitioned to Lasix 20 mg daily.  -Will increase home regimen Lasix from 10 mg to 20 mg daily on discharge. -Patient also maintained on home regimen beta-blocker and HCTZ, and hydralazine. -Outpatient follow-up with PCP.  Obesity (BMI 30-39.9) OSA Not on CPAP  Body mass index is 31.52 kg/m.  Placing the pt at higher risk  of poor outcomes. -Patient noted to require nocturnal oxygen which was  ordered. -Outpatient follow-up with pulmonary for sleep evaluation.  Allergic rhinitis - Claritin, Flonase.  Generalized weakness - Patient seen by PT/OT. -Home health therapies ordered.  History of CVA (cerebrovascular accident) With left hemiparesis. Exam unchanged. -Continue aspirin, Monitor.  PT OT recommended home with home health.  Anxiety and depression Patient maintained on home regimen Zoloft and Xanax.  Essential hypertension -BP noted to be elevated patient started back on half home dose of Norvasc 5 mg daily as well as hydralazine 25 mg 3 times daily.   -Norvasc increased to 10 mg daily for better blood pressure control.   -Patient placed on Lasix 20 mg daily on home regimen of HCTZ resumed as well as Toprol-XL. -Patient's hydralazine was decreased to 25 mg 3 times daily. -Blood pressure remained adequately controlled during the hospitalization. -Outpatient follow-up with PCP.  Type 2 diabetes, controlled, with retinopathy ([place]) Uncontrolled with hyperglycemia due to steroids. Hemoglobin A1c 6.8 roughly 2 weeks prior to admission. Not on any insulin at home. -Elevated CBGs in light secondary to steroids. -Steroids being tapered.  -Patient maintained on Semglee 10 units daily, NovoLog 5 units 3 times daily with meals as well as SSI. -On day of discharge patient noted to have a CBG of 54 however remained asymptomatic and blood glucose responded to orange juice. -Patient was discharged home back on home regimen of diabetic medications with 3 more days of oral steroids. -Outpatient follow-up with PCP. -Continue Semglee 10 units daily, SSI, NovoLog 5 units 3 times daily with meals, SSI.        Procedures: Chest x-ray 01/05/2022, 01/08/2022, 01/13/2022 2D echo 01/06/2022    Consultations: PCCM: Dr. Valeta Harms 01/14/2022  Discharge Exam: Vitals:   01/16/22 0700 01/16/22 0801  BP:  (!) 188/66  Pulse:  (!) 55  Resp:  19  Temp: 98.1 F (36.7 C) 97.9 F (36.6 C)   SpO2:  95%    General: NAD. Cardiovascular: RRR no murmurs rubs or gallops.  No JVD.  No lower extremity edema. Respiratory: Decreased rhonchorous breath sounds.  No wheezing.  Fair air movement.  Speaking in full sentences.  Discharge Instructions   Discharge Instructions     Diet - low sodium heart healthy   Complete by: As directed    Increase activity slowly   Complete by: As directed       Allergies as of 01/16/2022       Reactions   Tape Other (See Comments)   The patient's skin is VERY THIN- will TEAR EASILY!!   Voltaren [diclofenac] Swelling, Other (See Comments)   Angioedema        Medication List     STOP taking these medications    cephALEXin 500 MG capsule Commonly known as: KEFLEX       TAKE these medications    acetaminophen 500 MG tablet Commonly known as: TYLENOL Take 1,000 mg by mouth at bedtime.   albuterol 108 (90 Base) MCG/ACT inhaler Commonly known as: VENTOLIN HFA Inhale 2 puffs into the lungs in the morning, at noon, and at bedtime for 3 days, THEN 2 puffs every 4 (four) hours as needed for up to 3 days for shortness of breath or wheezing. Start taking on: Jan 16, 2022 What changed: See the new instructions.   ALPRAZolam 0.5 MG tablet Commonly known as: XANAX Take 1 tablet (0.5 mg total) by mouth 2 (two) times daily. TAKE 1/2 TO 1 TABLET BY MOUTH TWICE  A DAY AS NEEDED FOR ANXIETY Strength: 0.5 mg What changed:  when to take this reasons to take this additional instructions   amLODipine 10 MG tablet Commonly known as: NORVASC TAKE 1 TABLET BY MOUTH EVERY DAY   aspirin EC 325 MG tablet Take 325 mg by mouth in the morning.   benzonatate 100 MG capsule Commonly known as: TESSALON Take 1 capsule (100 mg total) by mouth 3 (three) times daily for 5 days.   budesonide-formoterol 160-4.5 MCG/ACT inhaler Commonly known as: Symbicort Inhale 2 puffs into the lungs 2 (two) times daily.   calcium-vitamin D 500-200 MG-UNIT  tablet Commonly known as: OSCAL WITH D Take 1 tablet by mouth daily.   Cranberry 500 MG Tabs Take 500 mg by mouth in the morning and at bedtime.   dextromethorphan-guaiFENesin 30-600 MG 12hr tablet Commonly known as: MUCINEX DM Take 1 tablet by mouth 2 (two) times daily for 5 days.   docusate sodium 100 MG capsule Commonly known as: COLACE Take 200 mg by mouth at bedtime.   estradiol 0.1 MG/GM vaginal cream Commonly known as: ESTRACE Place 1 g vaginally daily.   Flonase 50 MCG/ACT nasal spray Generic drug: fluticasone Place 2 sprays into both nostrils See admin instructions. Instill 2 sprays into each nostril at bedtime   FreeStyle Libre 2 [facility] 1 each by Does not apply route as directed.   FreeStyle Libre 2 Sensor Misc 1 each by Does not apply route as directed.   furosemide 20 MG tablet Commonly known as: LASIX Take 1 tablet (20 mg total) by mouth daily. What changed: how much to take   glipiZIDE 5 MG 24 hr tablet Commonly known as: GLUCOTROL XL TAKE 1 TABLET BY MOUTH EVERY DAY WITH BREAKFAST What changed: See the new instructions.   hydrALAZINE 25 MG tablet Commonly known as: APRESOLINE Take 1 tablet (25 mg total) by mouth every 8 (eight) hours. What changed:  medication strength how much to take when to take this   hydrochlorothiazide 12.5 MG capsule Commonly known as: MICROZIDE TAKE 1 CAPSULE BY MOUTH EVERY DAY What changed: how much to take   Januvia 100 MG tablet Generic drug: sitaGLIPtin TAKE 1 TABLET BY MOUTH EVERY DAY What changed: how much to take   loratadine 10 MG tablet Commonly known as: CLARITIN Take 1 tablet (10 mg total) by mouth daily.   metoprolol succinate 25 MG 24 hr tablet Commonly known as: TOPROL-XL Take 12.5 mg by mouth 2 (two) times daily.   mirtazapine 15 MG tablet Commonly known as: REMERON Take 1 tablet (15 mg total) by mouth at bedtime.   multivitamin capsule Take 1 capsule by mouth daily.   ONE TOUCH ULTRA 2  w/Device Kit Check blood sugar once daily and as directed. Dx A19.3   OneTouch Delica Plus [id number] Misc CHECK BLOOD SUGAR ONCE DAILY AND AS DIRECTED   OneTouch Ultra test strip Generic drug: glucose blood CHECK BLOOD SUGAR ONCE DAILY AND AS DIRECTED.   pantoprazole 40 MG tablet Commonly known as: PROTONIX Take 1 tablet (40 mg total) by mouth daily at 6 (six) AM. Start taking on: Jan 17, 2022   PATADAY OP Place 1 drop into both eyes 2 (two) times daily as needed (for itching or redness).   pioglitazone 30 MG tablet Commonly known as: Actos Take 1 tablet (30 mg total) by mouth daily.   predniSONE 20 MG tablet Commonly known as: DELTASONE Take 1.5 tablets (30 mg total) by mouth daily before breakfast for 3  days.   PreserVision AREDS 2+Multi Vit Caps Take 1 capsule by mouth in the morning and at bedtime.   sertraline 50 MG tablet Commonly known as: ZOLOFT Take 1 tablet (50 mg total) by mouth daily.   solifenacin 10 MG tablet Commonly known as: VESICARE Take 1 tablet (10 mg total) by mouth daily.   trandolapril 4 MG tablet Commonly known as: MAVIK TAKE 1 TABLET DAILY   triamcinolone 55 MCG/ACT Aero nasal inhaler Commonly known as: NASACORT Place 2 sprays into the nose daily. What changed:  when to take this additional instructions   vitamin B-12 1000 MCG tablet Commonly known as: CYANOCOBALAMIN Take 1,000 mcg by mouth daily.   Vitamin D-3 25 MCG (1000 UT) Caps Take 1,000 Units by mouth in the morning.               Durable Medical Equipment  (From admission, onward)           Start     Ordered   01/14/22 1453  For home use only DME oxygen  Once       Question Answer Comment  Length of Need 6 Months   Mode or (Route) Nasal cannula   Liters per Minute 2   Frequency Only at night (stationary unit needed)   Oxygen delivery system Gas      01/14/22 1452           Allergies  Allergen Reactions   Tape Other (See Comments)    The patient's  skin is VERY THIN- will TEAR EASILY!!   Voltaren [Diclofenac] Swelling and Other (See Comments)    Angioedema    Follow-up Information     [facility].   Why: [address]  [place], [place] [address] [phone]  Someone from the office will call to schedule home health visits        [name], MD. Schedule an appointment as soon as possible for a visit in 1 week(s).   Specialty: Family Medicine Contact information: [address] [place] [address] [phone]         Garner Nash, DO. Schedule an appointment as soon as possible for a visit in 3 week(s).   Specialty: Pulmonary Disease Why: /fu in 2-3 weeks. Contact information: [place] [address] [phone]                  The results of significant diagnostics from this hospitalization (including imaging, microbiology, ancillary and laboratory) are listed below for reference.    Significant Diagnostic Studies: DG CHEST PORT 1 VIEW  Result Date: 01/13/2022 CLINICAL DATA:  Congestion, shortness of breath EXAM: PORTABLE CHEST 1 VIEW COMPARISON:  01/08/2022 FINDINGS: Lungs are clear.  No pleural effusion or pneumothorax. The heart is normal in size. IMPRESSION: No evidence of acute cardiopulmonary disease. Electronically Signed   By: Julian Hy M.D.   On: 01/13/2022 18:03   DG CHEST PORT 1 VIEW  Result Date: 01/08/2022 CLINICAL DATA:  Shortness of breath EXAM: PORTABLE CHEST 1 VIEW COMPARISON:  Jan 05, 2022. FINDINGS: EKG leads project over the chest.  Image rotated to the LEFT. Stable mild to moderate cardiac enlargement, accentuated by portable technique and AP projection. Basilar airspace opacities and increased interstitial markings greatest on the LEFT unchanged. On limited assessment there is no acute skeletal finding. IMPRESSION: Basilar airspace opacities and increased interstitial markings greatest on the LEFT.  Findings may  reflect heart failure with volume overload and basilar atelectasis. Correlate with any signs of infection. Electronically Signed   By: Zetta Bills M.D.   On: 01/08/2022 13:22   DG Chest Portable 1 View  Result Date: 01/05/2022 CLINICAL DATA:  Cough. EXAM: PORTABLE CHEST 1 VIEW COMPARISON:  Chest x-ray 08/29/2021. FINDINGS: There is some left basilar opacities silhouetting the hemidiaphragm. Questionable small left pleural effusion. Right lung is clear. The heart is enlarged, unchanged. No pneumothorax or acute fracture. IMPRESSION: 1. Left basilar atelectasis/airspace disease with questionable small left pleural effusion. 2. Stable cardiomegaly. Electronically Signed   By: Ronney Asters M.D.   On: 01/05/2022 19:19   ECHOCARDIOGRAM COMPLETE  Result Date: 01/06/2022    ECHOCARDIOGRAM REPORT   Patient Name:   Renee [name] Date of Exam: 01/06/2022 Medical Rec #:  [id number]      Height:       63.0 in Accession #:    [id number]     Weight:       178.6 lb Date of Birth:  1928-08-24      BSA:          1.843 m Patient Age:    45 years       BP:           149/65 mmHg Patient Gender: F              HR:           66 bpm. Exam Location:  Inpatient Procedure: 2D Echo, Cardiac Doppler and Color Doppler Indications:    CHF  History:        Patient has prior history of Echocardiogram examinations, most                 recent 06/09/2020. CHF; Risk Factors:Hypertension and Diabetes.  Sonographer:    Joette Catching RCS Referring Phys: [id number] [place]  1. Left ventricular ejection fraction, by estimation, is 65 to 70%. The left ventricle has normal function. The left ventricle has no regional wall motion abnormalities. There is moderate asymmetric left ventricular hypertrophy of the septal segment. Left ventricular diastolic parameters are consistent with Grade II diastolic dysfunction (pseudonormalization). Elevated left atrial pressure.  2. Right ventricular systolic function is normal.  The right ventricular size is mildly enlarged. Tricuspid regurgitation signal is inadequate for assessing PA pressure.  3. Left atrial size was severely dilated.  4. The vaena cava still collapses >50% with inspiration and there is no diastolic chamber collapse. Moderate pericardial effusion. There is no evidence of cardiac tamponade.  5. The mitral valve is normal in structure. No evidence of mitral valve regurgitation.  6. The aortic valve is tricuspid. Aortic valve regurgitation is not visualized. No aortic stenosis is present.  7. The inferior vena cava is dilated in size with >50% respiratory variability, suggesting right atrial pressure of 8 mmHg. Comparison(s): The left ventricular function is unchanged. The left ventricular diastolic function is significantly worse. There is evidence of increased right and left heart filling pressures. Ther pericardial effusion is larger, and there are some early findings of tamponade. Conclusion(s)/Recommendation(s): Pericardiocentesis does not appear indicated at this time, but it may be indicated to repeat limited echo for evaluation of the pericardial effusion. FINDINGS  Left Ventricle: Left ventricular ejection fraction, by estimation, is 65 to 70%. The left ventricle has normal function. The left ventricle has no regional wall motion abnormalities. The left ventricular internal cavity size was normal in size. There is  moderate asymmetric  left ventricular hypertrophy of the septal segment. Left ventricular diastolic parameters are consistent with Grade II diastolic dysfunction (pseudonormalization). Elevated left atrial pressure. Right Ventricle: The right ventricular size is mildly enlarged. No increase in right ventricular wall thickness. Right ventricular systolic function is normal. Tricuspid regurgitation signal is inadequate for assessing PA pressure. Left Atrium: Left atrial size was severely dilated. Right Atrium: Right atrial size was normal in size.  Pericardium: The vaena cava still collapses >50% with inspiration and there is no diastolic chamber collapse. A moderately sized pericardial effusion is present. There is excessive respiratory variation in the mitral valve spectral Doppler velocities and  excessive respiratory variation in the tricuspid valve spectral Doppler velocities. There is no evidence of cardiac tamponade. Mitral Valve: The mitral valve is normal in structure. No evidence of mitral valve regurgitation. Tricuspid Valve: The tricuspid valve is normal in structure. Tricuspid valve regurgitation is not demonstrated. Aortic Valve: The aortic valve is tricuspid. Aortic valve regurgitation is not visualized. No aortic stenosis is present. Aortic valve mean gradient measures 9.0 mmHg. Aortic valve peak gradient measures 17.5 mmHg. Aortic valve area, by VTI measures 2.89  cm. Pulmonic Valve: The pulmonic valve was grossly normal. Pulmonic valve regurgitation is not visualized. Aorta: The aortic root and ascending aorta are structurally normal, with no evidence of dilitation. Venous: The inferior vena cava is dilated in size with greater than 50% respiratory variability, suggesting right atrial pressure of 8 mmHg. IAS/Shunts: No atrial level shunt detected by color flow Doppler.  LEFT VENTRICLE PLAX 2D LVIDd:         4.70 cm   Diastology LVIDs:         2.80 cm   LV e' medial:    4.03 cm/s LV PW:         1.00 cm   LV E/e' medial:  31.5 LV IVS:        1.70 cm   LV e' lateral:   4.68 cm/s LVOT diam:     2.00 cm   LV E/e' lateral: 27.1 LV SV:         138 LV SV Index:   75 LVOT Area:     3.14 cm  RIGHT VENTRICLE             IVC RV Basal diam:  4.40 cm     IVC diam: 2.40 cm RV Mid diam:    3.00 cm RV S prime:     15.70 cm/s TAPSE (M-mode): 1.7 cm LEFT ATRIUM             Index        RIGHT ATRIUM           Index LA diam:        5.00 cm 2.71 cm/m   RA Area:     15.20 cm LA Vol (A2C):   66.5 ml 36.09 ml/m  RA Volume:   38.80 ml  21.06 ml/m LA Vol (A4C):    87.2 ml 47.32 ml/m LA Biplane Vol: 83.0 ml 45.04 ml/m  AORTIC VALVE                     PULMONIC VALVE AV Area (Vmax):    2.69 cm      PV Vmax:       1.33 m/s AV Area (Vmean):   2.68 cm      PV Peak grad:  7.1 mmHg AV Area (VTI):     2.89 cm AV Vmax:  209.00 cm/s AV Vmean:          142.000 cm/s AV VTI:            0.477 m AV Peak Grad:      17.5 mmHg AV Mean Grad:      9.0 mmHg LVOT Vmax:         179.00 cm/s LVOT Vmean:        121.000 cm/s LVOT VTI:          0.439 m LVOT/AV VTI ratio: 0.92  AORTA Ao Root diam: 3.00 cm Ao Asc diam:  3.20 cm MITRAL VALVE MV Area (PHT): 4.54 cm     SHUNTS MV Decel Time: 167 msec     Systemic VTI:  0.44 m MV E velocity: 127.00 cm/s  Systemic Diam: 2.00 cm MV A velocity: 115.00 cm/s MV E/A ratio:  1.10 Mihai Croitoru MD Electronically signed by Sanda Klein MD Signature Date/Time: 01/06/2022/4:45:51 PM    Final     Microbiology: No results found for this or any previous visit (from the past 240 hour(s)).   Labs: Basic Metabolic Panel: Recent Labs  Lab 01/10/22 0349 01/11/22 0258 01/12/22 0626 01/13/22 0139 01/14/22 0123 01/15/22 0137  NA 135 135 139 137 137 136  K 5.1 4.1 4.1 4.5 4.5 3.9  CL 101 101 103 101 103 101  CO2 _0 GLUCOSE 259* 72 153* 203* 143* 132*  BUN 36* 45* 37* 40* 39* 39*  CREATININE 1.36* 1.40* 1.31* 1.37* 1.41* 1.52*  CALCIUM 8.4* 8.5* 8.4* 8.6* 8.5* 8.6*  MG 1.9 1.8 1.7  --   --   --    Liver Function Tests: Recent Labs  Lab 01/10/22 0349 01/11/22 0258 01/12/22 0626  AST _1 ALT _2 ALKPHOS 54 54 50  BILITOT 0.3 0.2* 0.5  PROT 4.9* 4.8* 4.6*  ALBUMIN 2.5* 2.7* 2.6*   No results for input(s): LIPASE, AMYLASE in the last 168 hours. No results for input(s): AMMONIA in the last 168 hours. CBC: Recent Labs  Lab 01/11/22 0258 01/12/22 0626 01/13/22 0139 01/14/22 0123 01/15/22 0137  WBC 9.2 6.9 9.1 10.7* 11.0*  NEUTROABS  --   --   --  8.5* 8.5*  HGB 9.8* 9.7* 9.8* 10.1* 10.0*  HCT  31.0* 30.6* 30.9* 31.7* 31.0*  MCV 88.3 90.0 89.3 89.0 88.8  PLT 187 172 211 215 221   Cardiac Enzymes: No results for input(s): CKTOTAL, CKMB, CKMBINDEX, TROPONINI in the last 168 hours. BNP: BNP (last 3 results) Recent Labs    06/23/21 1354 08/30/21 0134 01/05/22 1855  BNP 141.0* 90.3 243.4*    ProBNP (last 3 results) No results for input(s): PROBNP in the last 8760 hours.  CBG: Recent Labs  Lab 01/15/22 1333 01/15/22 1657 01/15/22 2036 01/15/22 2105 01/16/22 0745  GLUCAP 352* 146* 56* 88 106*       Signed:  Irine Seal MD.  [facility] 01/16/2022, 10:34 AM

## 2022-01-16 NOTE — Progress Notes (Signed)
Physical Therapy Treatment Patient Details Name: ADRINA ARMIJO MRN: [id number] DOB: 06/26/1929 Today's Date: 01/16/2022   History of Present Illness Mahlet Jergens Lisenbee is a 88 y.o. female who presented with geenralized weakness ongoing for 3 days. Pt admitted for  severe sepsis due to community-acquired pneumonia . Pt with medical history significant for type 2 diabetes mellitus, mild intermittent asthma, essential pretension, allergic rhinitis, chronic diastolic heart failure.    PT Comments    Pt was seen for session today, requiring additional help to walk due to sudden sitting down due to feeling urge to void.  Talked with her caregiver about using RW as a guard for gait initially, or having two person help due to inconsistent performance from the pt.  In review of her past PT notes, has been requiring sudden provision of seating during longer walks.  Pt's caregiver acknowledges that she is aware, and discussed using rollator.  Talked with pt about how rollator would not replace two person help or close guard of a chair since pt may not be able to turn that suddenly.  Follow acutely as her stay permits, reinforcing safety and encouraging pt to be aware of her limits.  Caregiver will be included in training safety as she is available.   Recommendations for follow up therapy are one component of a multi-disciplinary discharge planning process, led by the attending physician.  Recommendations may be updated based on patient status, additional functional criteria and insurance authorization.  Follow Up Recommendations  Home health PT     Assistance Recommended at Discharge Frequent or constant Supervision/Assistance  Patient can return home with the following A lot of help with walking and/or transfers;A lot of help with bathing/dressing/bathroom;Assistance with cooking/housework;Direct supervision/assist for medications management;Direct supervision/assist for financial management;Assist for  transportation;Help with stairs or ramp for entrance   Equipment Recommendations  None recommended by PT    Recommendations for Other Services       Precautions / Restrictions Precautions Precautions: Fall Precaution Comments: monitor O2 Restrictions Weight Bearing Restrictions: No     Mobility  Bed Mobility Overal bed mobility: Needs Assistance Bed Mobility: Supine to Sit     Supine to sit: Min guard, Min assist     General bed mobility comments: minor help with blankets and gown    Transfers Overall transfer level: Needs assistance Equipment used: Rolling walker (2 wheels) Transfers: Sit to/from Stand Sit to Stand: Min assist, +2 physical assistance, +2 safety/equipment           General transfer comment: min of two due to her use of walker preferentially to stand    Ambulation/Gait Ambulation/Gait assistance: Min assist Gait Distance (Feet): 38 Feet Assistive device: Rolling walker (2 wheels) Gait Pattern/deviations: Step-to pattern, Decreased stride length, Wide base of support, Trunk flexed Gait velocity: decreased Gait velocity interpretation: <1.31 ft/sec, indicative of household ambulator Pre-gait activities: standing balance skills General Gait Details: continual cues for posture and to stay inside walker, tends to sit quickly when fatigued or when she is noticing urination may begin   Stairs             Wheelchair Mobility    Modified Rankin (Stroke Patients Only)       Balance     Sitting balance-Leahy Scale: Fair       Standing balance-Leahy Scale: Poor  Cognition Arousal/Alertness: Awake/alert Behavior During Therapy: Flat affect Overall Cognitive Status: Impaired/Different from baseline Area of Impairment: Problem solving, Awareness, Safety/judgement, Attention, Orientation, Following commands, Memory                 Orientation Level: Place, Time, Situation Current  Attention Level: Sustained, Selective Memory: Decreased short-term memory Following Commands: Follows one step commands with increased time, Follows multi-step commands inconsistently Safety/Judgement: Decreased awareness of deficits, Decreased awareness of safety Awareness: Intellectual, Emergent Problem Solving: Slow processing, Requires verbal cues, Requires tactile cues, Difficulty sequencing General Comments: pt suddenly announced she was going to urinate, and immediately tried to sit down        Exercises      General Comments General comments (skin integrity, edema, etc.): sats were 95% and then elevated to 98% with gait, no issues with maintaining sats on RA      Pertinent Vitals/Pain Pain Assessment Pain Assessment: No/denies pain    Home Living                          Prior Function            PT Goals (current goals can now be found in the care plan section) Acute Rehab PT Goals Patient Stated Goal: to go home Progress towards PT goals: Not progressing toward goals - comment (shorter trip with L side weakness an issue, trying to sit suddenly with pt report she is going to urinate)    Frequency    Min 3X/week      PT Plan Current plan remains appropriate (providing two person help can be available)    Co-evaluation              AM-PAC PT "6 Clicks" Mobility   Outcome Measure  Help needed turning from your back to your side while in a flat bed without using bedrails?: A Little Help needed moving from lying on your back to sitting on the side of a flat bed without using bedrails?: A Little Help needed moving to and from a bed to a chair (including a wheelchair)?: A Little Help needed standing up from a chair using your arms (e.g., wheelchair or bedside chair)?: A Lot Help needed to walk in hospital room?: A Lot Help needed climbing 3-5 steps with a railing? : A Lot 6 Click Score: 15    End of Session Equipment Utilized During  Treatment: Gait belt (obtained one for home) Activity Tolerance: Patient tolerated treatment well Patient left: with call bell/phone within reach;in chair;with family/visitor present;with nursing/sitter in room Nurse Communication: Mobility status PT Visit Diagnosis: Unsteadiness on feet (R26.81);Muscle weakness (generalized) (M62.81);Difficulty in walking, not elsewhere classified (R26.2)     Time: 1610-9604 (+[id number]) PT Time Calculation (min) (ACUTE ONLY): 33 min  Charges:  $Gait Training: 8-22 mins $Therapeutic Activity: 8-22 mins $Neuromuscular Re-education: 8-22 mins    Ramond Dial 01/16/2022, 11:07 AM  Mee Hives, PT PhD Acute Rehab Dept. Number: [place] and [place]

## 2022-01-16 NOTE — Progress Notes (Signed)
CPT not done at scheduled time due to the patient eating breakfast.

## 2022-01-16 NOTE — Progress Notes (Signed)
Have permission from [place] to discharge this patient with the caregiver and caregiver's husband for transport to home. There is no eye MD appointment today, that is tomorrow. Caregiver expressed that she is aware the patient is max/mod assist and will have assistance to transfer to and from car for discharge to home.

## 2022-01-16 NOTE — TOC Transition Note (Signed)
Transition of Care [facility]) - CM/SW Discharge Note   Patient Details  Name: Renee [name] MRN: [id number] Date of Birth: 1928-12-16  [facility]) CM/SW Contact:  Sharin Mons, RN Phone Number: 01/16/2022, 10:36 AM   Clinical Narrative:    Patient will DC to: home Anticipated DC date: 01/16/2022 Family notified:yes Transport by: car  Per MD patient ready for DC today. RN, patient, patient's family, and [facility] notified of DC. Pt/family declined SNF placement, adamently. NCM spoke with  sister Dedra Skeens [facility]) @ [phone]. Referral made with [facility] for home oxygen. Oxygen will be delivered to pt home today for hs usage. Post hospital f/u noted on AVS. Family to provide transportation to home. [facility] will bring Rx meds to bedside prior to d/c. Caretaker to provide transportation to home.  RNCM will sign off for now as intervention is no longer needed. Please consult [place] again if new needs arise.   Final next level of care: [place] Barriers to Discharge: No Barriers Identified   Patient Goals and CMS Choice Patient states their goals for this hospitalization and ongoing recovery are:: home with family and caregiver support CMS Medicare.gov Compare Post Acute Care list provided to:: Patient Choice offered to / list presented to : Patient  Discharge Placement                       Discharge Plan and Services   Discharge Planning Services: CM Consult Post Acute Care Choice: Home Health          DME Arranged: Oxygen DME Agency: [facility] Date DME Agency Contacted: 01/16/22 Time DME Agency Contacted: [phone] Representative spoke with at DME Agency: [facility] HH Arranged: PT, OT [place] Agency: [facility] (now known as [facility]) Date [place]: 01/07/22 Time [place]: [phone] Representative spoke with at [place]: [place] [facility])  Interventions     Readmission Risk Interventions    06/10/2020    4:24 PM  Readmission Risk Prevention Plan  PCP or Specialist appointment within 3-5 days of discharge Complete

## 2022-01-16 NOTE — Assessment & Plan Note (Signed)
See above

## 2022-01-16 NOTE — Telephone Encounter (Signed)
Spoke with the pt's daughter and scheduled appt with TP for HFU on 01/19/22 Nothing further needed

## 2022-01-16 NOTE — Progress Notes (Signed)
Personal Caregiver has stated the patient has an eye MD appt today at 1pm and the family has requested the patient keep this appointment.  Family has approved discharge to appointment with the caregiver.

## 2022-01-16 NOTE — Telephone Encounter (Signed)
Requested medication (s) are due for refill today: yes  Requested medication (s) are on the active medication list: no  Last refill:  unsure  Future visit scheduled: no  Notes to clinic:  Alternative Requested:PT PAYING $47 FOR SOLIF. PT REQUESTING LOWER-COST ALTERNATIVE: OXYBU. $5;     Requested Prescriptions  Pending Prescriptions Disp Refills   oxybutynin (DITROPAN-XL) 10 MG 24 hr tablet [Pharmacy Med Name: OXYBUTYNIN CL ER 10 MG TABLET] 30 tablet 0     Urology:  Bladder Agents Passed - 01/13/2022  6:14 PM      Passed - Valid encounter within last 12 months    Recent Outpatient Visits           3 weeks ago Type 2 diabetes mellitus with hyperglycemia, without long-term current use of insulin ([place])   [place] Pickard, Cammie Mcgee, MD   2 months ago 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, without long-term current use of insulin, macular edema presence unspecified, unspecified retinopathy severity ([place])   [place] [name], MD   3 months ago Type 2 diabetes mellitus with hyperglycemia, without long-term current use of insulin ([place])   [place] [name], MD   6 months ago Mild persistent asthma with exacerbation   [place] Noemi Chapel A, NP   6 months ago Mild persistent asthma with exacerbation   [place] Pickard, Cammie Mcgee, MD       Future Appointments             In 3 days Parrett, Fonnie Mu, NP [facility]   In 2 weeks Pickard, Cammie Mcgee, MD [place], [place]   In 3 weeks Girtha Rm, NP-C [employer] [facility]

## 2022-01-16 NOTE — Plan of Care (Signed)

## 2022-01-16 NOTE — Progress Notes (Signed)
Pt desats to 86% on room air while sleeping. Pt requires 2lpm at bedtime to maintain sats above 90%.

## 2022-01-17 ENCOUNTER — Telehealth: Payer: Self-pay

## 2022-01-17 DIAGNOSIS — H26492 Other secondary cataract, left eye: Secondary | ICD-10-CM | POA: Diagnosis not present

## 2022-01-17 DIAGNOSIS — H52203 Unspecified astigmatism, bilateral: Secondary | ICD-10-CM | POA: Diagnosis not present

## 2022-01-17 DIAGNOSIS — E113293 Type 2 diabetes mellitus with mild nonproliferative diabetic retinopathy without macular edema, bilateral: Secondary | ICD-10-CM | POA: Diagnosis not present

## 2022-01-17 NOTE — Telephone Encounter (Signed)
Renee [name] w/[facility] called stated that per pt, does not want OT care with caregiver-Brenda. Request to discontinue OT effective 01/17/22 per pt request.   Give verbal orders to discontinue since pt requested.

## 2022-01-18 ENCOUNTER — Telehealth: Payer: Self-pay

## 2022-01-18 NOTE — Telephone Encounter (Signed)
Renee [name] w/[place] w/[facility]  Has Delay care of service for today per pt and care giver.   Any questions please call

## 2022-01-18 NOTE — Telephone Encounter (Signed)
Transition Care Management Unsuccessful Follow-up Telephone Call  Date of discharge and from where:  01/16/2022 Renee [name]  Attempts:  1st Attempt  Reason for unsuccessful TCM follow-up call:  Left voice message

## 2022-01-19 ENCOUNTER — Encounter: Payer: Self-pay | Admitting: Adult Health

## 2022-01-19 ENCOUNTER — Ambulatory Visit (INDEPENDENT_AMBULATORY_CARE_PROVIDER_SITE_OTHER): Payer: Medicare HMO | Admitting: Adult Health

## 2022-01-19 DIAGNOSIS — D631 Anemia in chronic kidney disease: Secondary | ICD-10-CM | POA: Diagnosis not present

## 2022-01-19 DIAGNOSIS — A419 Sepsis, unspecified organism: Secondary | ICD-10-CM | POA: Diagnosis not present

## 2022-01-19 DIAGNOSIS — R5381 Other malaise: Secondary | ICD-10-CM

## 2022-01-19 DIAGNOSIS — M549 Dorsalgia, unspecified: Secondary | ICD-10-CM | POA: Diagnosis not present

## 2022-01-19 DIAGNOSIS — N3946 Mixed incontinence: Secondary | ICD-10-CM | POA: Diagnosis not present

## 2022-01-19 DIAGNOSIS — G8929 Other chronic pain: Secondary | ICD-10-CM | POA: Diagnosis not present

## 2022-01-19 DIAGNOSIS — J309 Allergic rhinitis, unspecified: Secondary | ICD-10-CM | POA: Diagnosis not present

## 2022-01-19 DIAGNOSIS — F411 Generalized anxiety disorder: Secondary | ICD-10-CM | POA: Diagnosis not present

## 2022-01-19 DIAGNOSIS — R001 Bradycardia, unspecified: Secondary | ICD-10-CM | POA: Diagnosis not present

## 2022-01-19 DIAGNOSIS — J9611 Chronic respiratory failure with hypoxia: Secondary | ICD-10-CM | POA: Diagnosis not present

## 2022-01-19 DIAGNOSIS — N390 Urinary tract infection, site not specified: Secondary | ICD-10-CM | POA: Diagnosis not present

## 2022-01-19 DIAGNOSIS — I5032 Chronic diastolic (congestive) heart failure: Secondary | ICD-10-CM | POA: Diagnosis not present

## 2022-01-19 DIAGNOSIS — J189 Pneumonia, unspecified organism: Secondary | ICD-10-CM

## 2022-01-19 DIAGNOSIS — I130 Hypertensive heart and chronic kidney disease with heart failure and stage 1 through stage 4 chronic kidney disease, or unspecified chronic kidney disease: Secondary | ICD-10-CM | POA: Diagnosis not present

## 2022-01-19 DIAGNOSIS — E669 Obesity, unspecified: Secondary | ICD-10-CM | POA: Diagnosis not present

## 2022-01-19 DIAGNOSIS — I69354 Hemiplegia and hemiparesis following cerebral infarction affecting left non-dominant side: Secondary | ICD-10-CM | POA: Diagnosis not present

## 2022-01-19 DIAGNOSIS — J45901 Unspecified asthma with (acute) exacerbation: Secondary | ICD-10-CM | POA: Diagnosis not present

## 2022-01-19 DIAGNOSIS — G4730 Sleep apnea, unspecified: Secondary | ICD-10-CM | POA: Diagnosis not present

## 2022-01-19 DIAGNOSIS — E11319 Type 2 diabetes mellitus with unspecified diabetic retinopathy without macular edema: Secondary | ICD-10-CM | POA: Diagnosis not present

## 2022-01-19 DIAGNOSIS — I700 Atherosclerosis of aorta: Secondary | ICD-10-CM | POA: Diagnosis not present

## 2022-01-19 DIAGNOSIS — E1122 Type 2 diabetes mellitus with diabetic chronic kidney disease: Secondary | ICD-10-CM | POA: Diagnosis not present

## 2022-01-19 DIAGNOSIS — F32A Depression, unspecified: Secondary | ICD-10-CM | POA: Diagnosis not present

## 2022-01-19 DIAGNOSIS — B0229 Other postherpetic nervous system involvement: Secondary | ICD-10-CM | POA: Diagnosis not present

## 2022-01-19 DIAGNOSIS — E538 Deficiency of other specified B group vitamins: Secondary | ICD-10-CM | POA: Diagnosis not present

## 2022-01-19 DIAGNOSIS — N184 Chronic kidney disease, stage 4 (severe): Secondary | ICD-10-CM | POA: Diagnosis not present

## 2022-01-19 DIAGNOSIS — J9601 Acute respiratory failure with hypoxia: Secondary | ICD-10-CM | POA: Diagnosis not present

## 2022-01-19 DIAGNOSIS — J4521 Mild intermittent asthma with (acute) exacerbation: Secondary | ICD-10-CM | POA: Diagnosis not present

## 2022-01-19 DIAGNOSIS — M1990 Unspecified osteoarthritis, unspecified site: Secondary | ICD-10-CM | POA: Diagnosis not present

## 2022-01-19 MED ORDER — ALBUTEROL SULFATE (2.5 MG/3ML) 0.083% IN NEBU: 2.5000 mg | INHALATION_SOLUTION | Freq: Four times a day (QID) | RESPIRATORY_TRACT | 5 refills | Status: DC | PRN

## 2022-01-19 NOTE — Assessment & Plan Note (Signed)
Recent exacerbation now improving.  Patient is continue on current maintenance regimen  Plan  Patient Instructions  Continue on Symbicort 2 puffs twice daily, rinse after use Albuterol inhaler or Neb as needed Mucinex or Tussin As needed  cough/congestion .  Flutter valve Three times a day  .  Follow up with Dr. Halford Chessman  or Fleur Audino in 4 weeks and As needed   Please contact office for sooner follow up if symptoms do not improve or worsen or seek emergency care

## 2022-01-19 NOTE — Assessment & Plan Note (Signed)
Recent hospitalization with bibasilar pneumonia.  Associated sepsis.  Patient is clinically improved.  Completed a full course of antibiotics.  Follow-up chest x-ray prior to discharge showed resolution of pneumonia  Plan  Patient Instructions  Continue on Symbicort 2 puffs twice daily, rinse after use Albuterol inhaler or Neb as needed Mucinex or Tussin As needed  cough/congestion .  Flutter valve Three times a day  .  Follow up with Dr. Halford Chessman  or Pallavi Clifton in 4 weeks and As needed   Please contact office for sooner follow up if symptoms do not improve or worsen or seek emergency care

## 2022-01-19 NOTE — Assessment & Plan Note (Signed)
Generalized weakness with physical deconditioning.  Patient is to begin home physical therapy as recommended

## 2022-01-19 NOTE — Assessment & Plan Note (Signed)
During hospital stay nocturnal hypoxemia was noted.  There was concern of possible underlying sleep apnea.  We discussed setting up a possible sleep study.  Patient declines and does not want to get a sleep study.  In fact she wants to get rid of oxygen.  We discussed staying on oxygen at bedtime.  Going forward in the next few weeks can set up for an overnight oximetry test to determine if nocturnal oxygen is indicated  Plan  Patient Instructions  Continue on Symbicort 2 puffs twice daily, rinse after use Albuterol inhaler or Neb as needed Mucinex or Tussin As needed  cough/congestion .  Flutter valve Three times a day  .  Follow up with Dr. Halford Chessman  or Tahiri Shareef in 4 weeks and As needed   Please contact office for sooner follow up if symptoms do not improve or worsen or seek emergency care

## 2022-01-19 NOTE — Progress Notes (Signed)
_0  ID: Renee [name], female    DOB: 04/27/1929, 88 y.o.   MRN: [id number]  Chief Complaint  Patient presents with   Hospitalization Follow-up    Referring provider: [name], MD  HPI: 88 year old female seen for pulmonary consult during hospitalization October 2021 for pneumonia and left transudative pleural effusion requiring thoracentesis Medical history significant for diabetes, hyperlipidemia, hypertension, stroke, stage IIIb chronic kidney disease  TEST/EVENTS :  Lt thoracentesis 06/07/20 >> 450 ml, glucose 293, protein < 3, LDH 62, WBC 1550 (51N, 75M, 21L), culture and cytology negative   Chest Imaging:  CT chest 05/21/20 >> small pericardial effusion, atherosclerosis, small/mod Lt pleural effusion, granuloma LUL, small HH   Cardiac Tests:  Echo 06/09/20 >> EF 65 to 70%, grade 1 DD   01/19/2022 Follow up : Post hospital follow up , CAP  Patient returns for follow-up visit.  She was last seen in the office February 2022.  She was recently hospitalized earlier this week for community-acquired pneumonia complicated by sepsis and acute respiratory failure, acute on chronic renal failure, asthma exacerbation.  She was treated with empiric antibiotics.  Viral panel was negative.  Blood cultures were negative.  Started on IV steroids and discharged on oral prednisone.  She was treated with aggressive nebulized bronchodilators.  She did require some diuresis for mild diastolic CHF decompensation.  2D echo showed EF at 65 to 70%, moderate asymmetrical left ventricular hypertrophy and grade 2 diastolic dysfunction.  Moderate pericardial effusion. Initial chest xray showed bibasilar opacities. Follow up chest xray showed clear lungs.  She was seen by pulmonology during hospital stay.  She was recommended on mucociliary clearance and mobilization.  Lives home at home alone. Has caregiver 8 am to 6 pm . Family helps in evening . Does not drive.  Uses a walker some.  PT at home  is coming today .  Has finished prednisone today .  Using flutter valve Three times a day  .  She remains on Symbicort twice daily.  Needs rx sent for albuterol nebs.  Eating good , no n/v.d/   Was started on Oxygen 2l/m At bedtime  . Was discussed that she might have sleep apnea. Denies snoring . Declines getting sleep study . Wants to see soon if she can get rid of oxygen.    Allergies  Allergen Reactions   Tape Other (See Comments)    The patient's skin is VERY THIN- will TEAR EASILY!!   Voltaren [Diclofenac] Swelling and Other (See Comments)    Angioedema    Immunization History  Administered Date(s) Administered   Fluad Quad(high Dose 65+) 06/02/2019, 05/13/2020, 05/26/2021   Influenza Split 05/21/2011, 06/11/2012   Influenza Whole 05/26/2004, 05/23/2010   Influenza,inj,Quad PF,6+ Mos 04/17/2013, 04/27/2014, 05/20/2015, 05/22/2016, 05/22/2017, 05/22/2018   PFIZER(Purple Top)SARS-COV-2 Vaccination 10/16/2019, 11/17/2019   Pfizer Covid-19 Vaccine Bivalent Booster 98yr & up 06/15/2021   Pneumococcal Conjugate-13 04/27/2014   Pneumococcal Polysaccharide-23 09/26/2005   Td 05/21/1999, 04/04/2012   Zoster Recombinat (Shingrix) 01/06/2020, 03/15/2020    Past Medical History:  Diagnosis Date   Angioedema    possibly from voltaren   Bronchopneumonia 12/11/2016   Degenerative disc disease    Diabetes mellitus    type II   Hyperlipidemia    Hypertension    LVH (left ventricular hypertrophy)    and atrial enlargement by echo in past with nl EF   Nasal pruritis    Osteoarthritis    Osteopenia    Renal insufficiency  Sleep apnea    Stroke ([place]) 05/2010   Small vessel sobcortical (in [place] trial) with Dr Leonie Man, residual L hemiparesis   Vitamin B 12 deficiency 04/08    Tobacco History: Social History   Tobacco Use  Smoking Status Never  Smokeless Tobacco Never   Counseling given: Not Answered   Outpatient Medications Prior to Visit  Medication Sig Dispense  Refill   acetaminophen (TYLENOL) 500 MG tablet Take 1,000 mg by mouth at bedtime.     albuterol (VENTOLIN HFA) 108 (90 Base) MCG/ACT inhaler Inhale 2 puffs into the lungs in the AM, noon, and bedtime for 3 days, then 2 puffs every 4 hrs as needed for shortness of breath/wheezing (for 3 days). 18 g 1   ALPRAZolam (XANAX) 0.5 MG tablet Take 1 tablet (0.5 mg total) by mouth 2 (two) times daily. TAKE 1/2 TO 1 TABLET BY MOUTH TWICE A DAY AS NEEDED FOR ANXIETY Strength: 0.5 mg (Patient taking differently: Take 0.5 mg by mouth 2 (two) times daily as needed for anxiety.) 20 tablet 0   amLODipine (NORVASC) 10 MG tablet TAKE 1 TABLET BY MOUTH EVERY DAY (Patient taking differently: Take 10 mg by mouth daily.) 90 tablet 3   aspirin EC 325 MG tablet Take 325 mg by mouth in the morning.     benzonatate (TESSALON) 100 MG capsule Take 1 capsule (100 mg total) by mouth 3 (three) times daily for 5 days. 15 capsule 0   Blood Glucose Monitoring Suppl (ONE TOUCH ULTRA 2) w/Device KIT Check blood sugar once daily and as directed. Dx E11.9 1 each 0   budesonide-formoterol (SYMBICORT) 160-4.5 MCG/ACT inhaler Inhale 2 puffs into the lungs 2 (two) times daily. 10.2 g 1   calcium-vitamin D (OSCAL WITH D) 500-200 MG-UNIT per tablet Take 1 tablet by mouth daily.     Cholecalciferol (VITAMIN D-3) 25 MCG (1000 UT) CAPS Take 1,000 Units by mouth in the morning.     Continuous Blood Gluc Sensor (FREESTYLE LIBRE 2 SENSOR) MISC 1 each by Does not apply route as directed. 1 each 5   Cranberry 500 MG TABS Take 500 mg by mouth in the morning and at bedtime.     dextromethorphan-guaiFENesin (MUCINEX DM) 30-600 MG 12hr tablet Take 1 tablet by mouth 2 (two) times daily for 5 days. 10 tablet 0   docusate sodium (COLACE) 100 MG capsule Take 200 mg by mouth at bedtime.     estradiol (ESTRACE) 0.1 MG/GM vaginal cream Place 1 g vaginally daily.     FLONASE 50 MCG/ACT nasal spray Place 2 sprays into both nostrils See admin instructions. Instill 2  sprays into each nostril at bedtime     furosemide (LASIX) 20 MG tablet Take 1 tablet (20 mg total) by mouth daily. 30 tablet 1   glipiZIDE (GLUCOTROL XL) 5 MG 24 hr tablet TAKE 1 TABLET BY MOUTH EVERY DAY WITH BREAKFAST (Patient taking differently: Take 5 mg by mouth daily with breakfast.) 90 tablet 3   glucose blood (ONETOUCH ULTRA) test strip CHECK BLOOD SUGAR ONCE DAILY AND AS DIRECTED. 100 strip 11   hydrALAZINE (APRESOLINE) 25 MG tablet Take 1 tablet (25 mg total) by mouth every 8 (eight) hours. 90 tablet 1   hydrochlorothiazide (MICROZIDE) 12.5 MG capsule TAKE 1 CAPSULE BY MOUTH EVERY DAY (Patient taking differently: Take 12.5 mg by mouth daily.) 90 capsule 1   JANUVIA 100 MG tablet TAKE 1 TABLET BY MOUTH EVERY DAY (Patient taking differently: Take 100 mg by mouth daily.) 30  tablet 2   Lancets (ONETOUCH DELICA PLUS [id number]) MISC CHECK BLOOD SUGAR ONCE DAILY AND AS DIRECTED 100 each 2   loratadine (CLARITIN) 10 MG tablet Take 1 tablet (10 mg total) by mouth daily. 30 tablet 1   metoprolol succinate (TOPROL-XL) 25 MG 24 hr tablet Take 12.5 mg by mouth 2 (two) times daily.     mirtazapine (REMERON) 15 MG tablet Take 1 tablet (15 mg total) by mouth at bedtime. 90 tablet 3   Multiple Vitamin (MULTIVITAMIN) capsule Take 1 capsule by mouth daily.     Multiple Vitamins-Minerals (PRESERVISION AREDS 2+MULTI VIT) CAPS Take 1 capsule by mouth in the morning and at bedtime.     Olopatadine HCl (PATADAY OP) Place 1 drop into both eyes 2 (two) times daily as needed (for itching or redness).     pantoprazole (PROTONIX) 40 MG tablet Take 1 tablet (40 mg total) by mouth daily at 6 (six) AM. 30 tablet 1   pioglitazone (ACTOS) 30 MG tablet Take 1 tablet (30 mg total) by mouth daily. 90 tablet 3   predniSONE (DELTASONE) 20 MG tablet Take 1.5 tablets (30 mg total) by mouth daily before breakfast for 3 days. 5 tablet 0   sertraline (ZOLOFT) 50 MG tablet Take 1 tablet (50 mg total) by mouth daily. 90 tablet 1    solifenacin (VESICARE) 10 MG tablet Take 1 tablet (10 mg total) by mouth daily. 30 tablet 1   trandolapril (MAVIK) 4 MG tablet TAKE 1 TABLET DAILY (Patient taking differently: Take 4 mg by mouth daily.) 90 tablet 3   triamcinolone (NASACORT) 55 MCG/ACT AERO nasal inhaler Place 2 sprays into the nose daily. (Patient taking differently: Place 2 sprays into the nose See admin instructions. Instill 2 sprays into each nostril at bedtime when not using Flonase) 1 each 5   vitamin B-12 (CYANOCOBALAMIN) 1000 MCG tablet Take 1,000 mcg by mouth daily.      Continuous Blood Gluc Receiver (FREESTYLE LIBRE 2 READER) DEVI 1 each by Does not apply route as directed. (Patient not taking: Reported on 01/19/2022) 1 each 1   No facility-administered medications prior to visit.     Review of Systems:   Constitutional:   No  weight loss, night sweats,  Fevers, chills, fatigue, or  lassitude.  HEENT:   No headaches,  Difficulty swallowing,  Tooth/dental problems, or  Sore throat,                No sneezing, itching, ear ache, nasal congestion, post nasal drip,   CV:  No chest pain,  Orthopnea, PND, swelling in lower extremities, anasarca, dizziness, palpitations, syncope.   GI  No heartburn, indigestion, abdominal pain, nausea, vomiting, diarrhea, change in bowel habits, loss of appetite, bloody stools.   Resp: No shortness of breath with exertion or at rest.  No excess mucus, no productive cough,  No non-productive cough,  No coughing up of blood.  No change in color of mucus.  No wheezing.  No chest wall deformity  Skin: no rash or lesions.  GU: no dysuria, change in color of urine, no urgency or frequency.  No flank pain, no hematuria   MS:  No joint pain or swelling.  No decreased range of motion.  No back pain.    Physical Exam  BP (!) 98/40 (BP Location: Right Arm, Patient Position: Sitting, Cuff Size: Normal)   Pulse (!) 56   Temp 98.1 F (36.7 C) (Oral)   Ht _0  (1.6 m)  Wt 150 lb (68 kg)    SpO2 98%   BMI 26.57 kg/m   GEN: A/Ox3; pleasant , NAD, well nourished    HEENT:  [place]/AT,  EACs-clear, TMs-wnl, NOSE-clear, THROAT-clear, no lesions, no postnasal drip or exudate noted.   NECK:  Supple w/ fair ROM; no JVD; normal carotid impulses w/o bruits; no thyromegaly or nodules palpated; no lymphadenopathy.    RESP  Clear  P & A; w/o, wheezes/ rales/ or rhonchi. no accessory muscle use, no dullness to percussion  CARD:  RRR, no m/r/g, no peripheral edema, pulses intact, no cyanosis or clubbing.  GI:   Soft & nt; nml bowel sounds; no organomegaly or masses detected.   Musco: Warm bil, no deformities or joint swelling noted.   Neuro: alert, no focal deficits noted.    Skin: Warm, no lesions or rashes    Lab Results:        ProBNP No results found for: PROBNP  Imaging: DG CHEST PORT 1 VIEW  Result Date: 01/13/2022 CLINICAL DATA:  Congestion, shortness of breath EXAM: PORTABLE CHEST 1 VIEW COMPARISON:  01/08/2022 FINDINGS: Lungs are clear.  No pleural effusion or pneumothorax. The heart is normal in size. IMPRESSION: No evidence of acute cardiopulmonary disease. Electronically Signed   By: Julian Hy M.D.   On: 01/13/2022 18:03   DG CHEST PORT 1 VIEW  Result Date: 01/08/2022 CLINICAL DATA:  Shortness of breath EXAM: PORTABLE CHEST 1 VIEW COMPARISON:  Jan 05, 2022. FINDINGS: EKG leads project over the chest.  Image rotated to the LEFT. Stable mild to moderate cardiac enlargement, accentuated by portable technique and AP projection. Basilar airspace opacities and increased interstitial markings greatest on the LEFT unchanged. On limited assessment there is no acute skeletal finding. IMPRESSION: Basilar airspace opacities and increased interstitial markings greatest on the LEFT. Findings may reflect heart failure with volume overload and basilar atelectasis. Correlate with any signs of infection. Electronically Signed   By: Zetta Bills M.D.   On: 01/08/2022 13:22    DG Chest Portable 1 View  Result Date: 01/05/2022 CLINICAL DATA:  Cough. EXAM: PORTABLE CHEST 1 VIEW COMPARISON:  Chest x-ray 08/29/2021. FINDINGS: There is some left basilar opacities silhouetting the hemidiaphragm. Questionable small left pleural effusion. Right lung is clear. The heart is enlarged, unchanged. No pneumothorax or acute fracture. IMPRESSION: 1. Left basilar atelectasis/airspace disease with questionable small left pleural effusion. 2. Stable cardiomegaly. Electronically Signed   By: Ronney Asters M.D.   On: 01/05/2022 19:19   ECHOCARDIOGRAM COMPLETE  Result Date: 01/06/2022    ECHOCARDIOGRAM REPORT   Patient Name:   JAMYIA FORTUNE Date of Exam: 01/06/2022 Medical Rec #:  [id number]      Height:       63.0 in Accession #:    [id number]     Weight:       178.6 lb Date of Birth:  04-Dec-1928      BSA:          1.843 m Patient Age:    12 years       BP:           149/65 mmHg Patient Gender: F              HR:           66 bpm. Exam Location:  Inpatient Procedure: 2D Echo, Cardiac Doppler and Color Doppler Indications:    CHF  History:        Patient has prior history of  Echocardiogram examinations, most                 recent 06/09/2020. CHF; Risk Factors:Hypertension and Diabetes.  Sonographer:    Joette Catching RCS Referring Phys: [id number] [place]  1. Left ventricular ejection fraction, by estimation, is 65 to 70%. The left ventricle has normal function. The left ventricle has no regional wall motion abnormalities. There is moderate asymmetric left ventricular hypertrophy of the septal segment. Left ventricular diastolic parameters are consistent with Grade II diastolic dysfunction (pseudonormalization). Elevated left atrial pressure.  2. Right ventricular systolic function is normal. The right ventricular size is mildly enlarged. Tricuspid regurgitation signal is inadequate for assessing PA pressure.  3. Left atrial size was severely dilated.  4. The vaena cava still  collapses >50% with inspiration and there is no diastolic chamber collapse. Moderate pericardial effusion. There is no evidence of cardiac tamponade.  5. The mitral valve is normal in structure. No evidence of mitral valve regurgitation.  6. The aortic valve is tricuspid. Aortic valve regurgitation is not visualized. No aortic stenosis is present.  7. The inferior vena cava is dilated in size with >50% respiratory variability, suggesting right atrial pressure of 8 mmHg. Comparison(s): The left ventricular function is unchanged. The left ventricular diastolic function is significantly worse. There is evidence of increased right and left heart filling pressures. Ther pericardial effusion is larger, and there are some early findings of tamponade. Conclusion(s)/Recommendation(s): Pericardiocentesis does not appear indicated at this time, but it may be indicated to repeat limited echo for evaluation of the pericardial effusion. FINDINGS  Left Ventricle: Left ventricular ejection fraction, by estimation, is 65 to 70%. The left ventricle has normal function. The left ventricle has no regional wall motion abnormalities. The left ventricular internal cavity size was normal in size. There is  moderate asymmetric left ventricular hypertrophy of the septal segment. Left ventricular diastolic parameters are consistent with Grade II diastolic dysfunction (pseudonormalization). Elevated left atrial pressure. Right Ventricle: The right ventricular size is mildly enlarged. No increase in right ventricular wall thickness. Right ventricular systolic function is normal. Tricuspid regurgitation signal is inadequate for assessing PA pressure. Left Atrium: Left atrial size was severely dilated. Right Atrium: Right atrial size was normal in size. Pericardium: The vaena cava still collapses >50% with inspiration and there is no diastolic chamber collapse. A moderately sized pericardial effusion is present. There is excessive respiratory  variation in the mitral valve spectral Doppler velocities and  excessive respiratory variation in the tricuspid valve spectral Doppler velocities. There is no evidence of cardiac tamponade. Mitral Valve: The mitral valve is normal in structure. No evidence of mitral valve regurgitation. Tricuspid Valve: The tricuspid valve is normal in structure. Tricuspid valve regurgitation is not demonstrated. Aortic Valve: The aortic valve is tricuspid. Aortic valve regurgitation is not visualized. No aortic stenosis is present. Aortic valve mean gradient measures 9.0 mmHg. Aortic valve peak gradient measures 17.5 mmHg. Aortic valve area, by VTI measures 2.89  cm. Pulmonic Valve: The pulmonic valve was grossly normal. Pulmonic valve regurgitation is not visualized. Aorta: The aortic root and ascending aorta are structurally normal, with no evidence of dilitation. Venous: The inferior vena cava is dilated in size with greater than 50% respiratory variability, suggesting right atrial pressure of 8 mmHg. IAS/Shunts: No atrial level shunt detected by color flow Doppler.  LEFT VENTRICLE PLAX 2D LVIDd:         4.70 cm   Diastology LVIDs:  2.80 cm   LV e' medial:    4.03 cm/s LV PW:         1.00 cm   LV E/e' medial:  31.5 LV IVS:        1.70 cm   LV e' lateral:   4.68 cm/s LVOT diam:     2.00 cm   LV E/e' lateral: 27.1 LV SV:         138 LV SV Index:   75 LVOT Area:     3.14 cm  RIGHT VENTRICLE             IVC RV Basal diam:  4.40 cm     IVC diam: 2.40 cm RV Mid diam:    3.00 cm RV S prime:     15.70 cm/s TAPSE (M-mode): 1.7 cm LEFT ATRIUM             Index        RIGHT ATRIUM           Index LA diam:        5.00 cm 2.71 cm/m   RA Area:     15.20 cm LA Vol (A2C):   66.5 ml 36.09 ml/m  RA Volume:   38.80 ml  21.06 ml/m LA Vol (A4C):   87.2 ml 47.32 ml/m LA Biplane Vol: 83.0 ml 45.04 ml/m  AORTIC VALVE                     PULMONIC VALVE AV Area (Vmax):    2.69 cm      PV Vmax:       1.33 m/s AV Area (Vmean):   2.68 cm       PV Peak grad:  7.1 mmHg AV Area (VTI):     2.89 cm AV Vmax:           209.00 cm/s AV Vmean:          142.000 cm/s AV VTI:            0.477 m AV Peak Grad:      17.5 mmHg AV Mean Grad:      9.0 mmHg LVOT Vmax:         179.00 cm/s LVOT Vmean:        121.000 cm/s LVOT VTI:          0.439 m LVOT/AV VTI ratio: 0.92  AORTA Ao Root diam: 3.00 cm Ao Asc diam:  3.20 cm MITRAL VALVE MV Area (PHT): 4.54 cm     SHUNTS MV Decel Time: 167 msec     Systemic VTI:  0.44 m MV E velocity: 127.00 cm/s  Systemic Diam: 2.00 cm MV A velocity: 115.00 cm/s MV E/A ratio:  1.10 Mihai Croitoru MD Electronically signed by Sanda Klein MD Signature Date/Time: 01/06/2022/4:45:51 PM    Final           View : No data to display.          No results found for: NITRICOXIDE      Assessment & Plan:   Asthma, chronic, unspecified asthma severity, with acute exacerbation Recent exacerbation now improving.  Patient is continue on current maintenance regimen  Plan  Patient Instructions  Continue on Symbicort 2 puffs twice daily, rinse after use Albuterol inhaler or Neb as needed Mucinex or Tussin As needed  cough/congestion .  Flutter valve Three times a day  .  Follow up with Dr. Halford Chessman  or Taisia Fantini in 4 weeks and  As needed   Please contact office for sooner follow up if symptoms do not improve or worsen or seek emergency care         CAP (community acquired pneumonia) Recent hospitalization with bibasilar pneumonia.  Associated sepsis.  Patient is clinically improved.  Completed a full course of antibiotics.  Follow-up chest x-ray prior to discharge showed resolution of pneumonia  Plan  Patient Instructions  Continue on Symbicort 2 puffs twice daily, rinse after use Albuterol inhaler or Neb as needed Mucinex or Tussin As needed  cough/congestion .  Flutter valve Three times a day  .  Follow up with Dr. Halford Chessman  or Ladarion Munyon in 4 weeks and As needed   Please contact office for sooner follow up if symptoms do not  improve or worsen or seek emergency care         Physical deconditioning Generalized weakness with physical deconditioning.  Patient is to begin home physical therapy as recommended  Chronic diastolic CHF (congestive heart failure) ([place]) Appears euvolemic on exam.  Continue on current regimen  Chronic respiratory failure with hypoxia ([place]) During hospital stay nocturnal hypoxemia was noted.  There was concern of possible underlying sleep apnea.  We discussed setting up a possible sleep study.  Patient declines and does not want to get a sleep study.  In fact she wants to get rid of oxygen.  We discussed staying on oxygen at bedtime.  Going forward in the next few weeks can set up for an overnight oximetry test to determine if nocturnal oxygen is indicated  Plan  Patient Instructions  Continue on Symbicort 2 puffs twice daily, rinse after use Albuterol inhaler or Neb as needed Mucinex or Tussin As needed  cough/congestion .  Flutter valve Three times a day  .  Follow up with Dr. Halford Chessman  or Shakeema Lippman in 4 weeks and As needed   Please contact office for sooner follow up if symptoms do not improve or worsen or seek emergency care           Rexene Edison, NP 01/19/2022

## 2022-01-19 NOTE — Assessment & Plan Note (Signed)
Appears euvolemic on exam.  Continue on current regimen 

## 2022-01-19 NOTE — Telephone Encounter (Signed)
Transition Care Management Unsuccessful Follow-up Telephone Call  Date of discharge and from where:  01/16/22 Renee [name]  Attempts:  2nd Attempt  Reason for unsuccessful TCM follow-up call:  Left voice message

## 2022-01-19 NOTE — Patient Instructions (Addendum)
Continue on Symbicort 2 puffs twice daily, rinse after use Albuterol inhaler or Neb as needed Mucinex or Tussin As needed  cough/congestion .  Flutter valve Three times a day  .  Follow up with Dr. Halford Chessman  or Vanity Larsson in 4 weeks and As needed   Please contact office for sooner follow up if symptoms do not improve or worsen or seek emergency care

## 2022-01-19 NOTE — Telephone Encounter (Signed)
Transition Care Management Follow-up Telephone Call Date of discharge and from where: [facility] How have you been since you were released from the hospital? Spoke with pt's sister in law [place], and she states patient is doing well. Any questions or concerns? No  Items Reviewed: Did the pt receive and understand the discharge instructions provided? Yes  Medications obtained and verified? Yes  Other? No  Any new allergies since your discharge? No  Dietary orders reviewed? Yes Do you have support at home? Yes   Home Care and Equipment/Supplies: Were home health services ordered? yes If so, what is the name of the agency? [facility]  Has the agency set up a time to come to the patient's home? yes Were any new equipment or medical supplies ordered?  Yes: [employer] What is the name of the medical supply agency? [employer] Were you able to get the supplies/equipment? yes Do you have any questions related to the use of the equipment or supplies? No  Functional Questionnaire: (I = Independent and D = Dependent) ADLs: D Pt has home health aid.  Bathing/Dressing- D  Meal Prep- D  Eating- I  Maintaining continence- I  Transferring/Ambulation- I  Managing Meds- D  Follow up appointments reviewed:  PCP Hospital f/u appt confirmed? Yes  Scheduled to see Dr Dennard Schaumann on 6/27/232 @ 12. Lawernce Pitts, that hospital follow up's should be seen within 2 weeks but [facility] request later appointment since patient has, "so many other appointments coming up."  [facility] f/u appt confirmed? No  Appointment not made at this time. Are transportation arrangements needed? No  If their condition worsens, is the pt aware to call PCP or go to the Emergency Dept.? Yes Was the patient provided with contact information for the PCP's office or ED? Yes Was to pt encouraged to call back with questions or concerns? Yes

## 2022-01-22 NOTE — Progress Notes (Signed)
Reviewed and agree with assessment/plan.   Chesley Mires, MD [facility] Pulmonary/Critical Care 01/22/2022, 1:49 PM Pager:  [phone]

## 2022-01-23 ENCOUNTER — Telehealth: Payer: Self-pay | Admitting: Family Medicine

## 2022-01-23 NOTE — Telephone Encounter (Signed)
Breann from [facility] left voicemail stating patient was admitted for home care on Friday. She is requeting [facility] orders for a PT plan of care once a week for 7 weeks.  Please advise [phone]

## 2022-01-24 NOTE — Telephone Encounter (Signed)
Please advise. Thank you

## 2022-01-24 NOTE — Telephone Encounter (Signed)
Breann called and gave verbal ok for PT -aware to fax order to [facility] and Dennard Schaumann can sign when he returns on Monday

## 2022-01-24 NOTE — Telephone Encounter (Signed)
Renee [name] from [facility] left another message. They are trying to get verbal orders. I have called her and let her know that Dr. Dennard Schaumann is currently out of the office and that we have sent a message in.  CB# [phone]

## 2022-01-26 ENCOUNTER — Ambulatory Visit (INDEPENDENT_AMBULATORY_CARE_PROVIDER_SITE_OTHER): Payer: Medicare HMO | Admitting: *Deleted

## 2022-01-26 DIAGNOSIS — N184 Chronic kidney disease, stage 4 (severe): Secondary | ICD-10-CM | POA: Diagnosis not present

## 2022-01-26 DIAGNOSIS — I130 Hypertensive heart and chronic kidney disease with heart failure and stage 1 through stage 4 chronic kidney disease, or unspecified chronic kidney disease: Secondary | ICD-10-CM | POA: Diagnosis not present

## 2022-01-26 DIAGNOSIS — I10 Essential (primary) hypertension: Secondary | ICD-10-CM

## 2022-01-26 DIAGNOSIS — G4730 Sleep apnea, unspecified: Secondary | ICD-10-CM | POA: Diagnosis not present

## 2022-01-26 DIAGNOSIS — M549 Dorsalgia, unspecified: Secondary | ICD-10-CM | POA: Diagnosis not present

## 2022-01-26 DIAGNOSIS — J309 Allergic rhinitis, unspecified: Secondary | ICD-10-CM | POA: Diagnosis not present

## 2022-01-26 DIAGNOSIS — I5032 Chronic diastolic (congestive) heart failure: Secondary | ICD-10-CM | POA: Diagnosis not present

## 2022-01-26 DIAGNOSIS — E1122 Type 2 diabetes mellitus with diabetic chronic kidney disease: Secondary | ICD-10-CM | POA: Diagnosis not present

## 2022-01-26 DIAGNOSIS — N3946 Mixed incontinence: Secondary | ICD-10-CM | POA: Diagnosis not present

## 2022-01-26 DIAGNOSIS — R001 Bradycardia, unspecified: Secondary | ICD-10-CM | POA: Diagnosis not present

## 2022-01-26 DIAGNOSIS — I69354 Hemiplegia and hemiparesis following cerebral infarction affecting left non-dominant side: Secondary | ICD-10-CM | POA: Diagnosis not present

## 2022-01-26 DIAGNOSIS — E669 Obesity, unspecified: Secondary | ICD-10-CM | POA: Diagnosis not present

## 2022-01-26 DIAGNOSIS — G8929 Other chronic pain: Secondary | ICD-10-CM | POA: Diagnosis not present

## 2022-01-26 DIAGNOSIS — I700 Atherosclerosis of aorta: Secondary | ICD-10-CM | POA: Diagnosis not present

## 2022-01-26 DIAGNOSIS — J9601 Acute respiratory failure with hypoxia: Secondary | ICD-10-CM | POA: Diagnosis not present

## 2022-01-26 DIAGNOSIS — F411 Generalized anxiety disorder: Secondary | ICD-10-CM | POA: Diagnosis not present

## 2022-01-26 DIAGNOSIS — D631 Anemia in chronic kidney disease: Secondary | ICD-10-CM | POA: Diagnosis not present

## 2022-01-26 DIAGNOSIS — F32A Depression, unspecified: Secondary | ICD-10-CM | POA: Diagnosis not present

## 2022-01-26 DIAGNOSIS — J189 Pneumonia, unspecified organism: Secondary | ICD-10-CM | POA: Diagnosis not present

## 2022-01-26 DIAGNOSIS — B0229 Other postherpetic nervous system involvement: Secondary | ICD-10-CM | POA: Diagnosis not present

## 2022-01-26 DIAGNOSIS — J4521 Mild intermittent asthma with (acute) exacerbation: Secondary | ICD-10-CM | POA: Diagnosis not present

## 2022-01-26 DIAGNOSIS — E538 Deficiency of other specified B group vitamins: Secondary | ICD-10-CM | POA: Diagnosis not present

## 2022-01-26 DIAGNOSIS — E1165 Type 2 diabetes mellitus with hyperglycemia: Secondary | ICD-10-CM

## 2022-01-26 DIAGNOSIS — N390 Urinary tract infection, site not specified: Secondary | ICD-10-CM | POA: Diagnosis not present

## 2022-01-26 DIAGNOSIS — E11319 Type 2 diabetes mellitus with unspecified diabetic retinopathy without macular edema: Secondary | ICD-10-CM | POA: Diagnosis not present

## 2022-01-26 DIAGNOSIS — A419 Sepsis, unspecified organism: Secondary | ICD-10-CM | POA: Diagnosis not present

## 2022-01-26 DIAGNOSIS — M1990 Unspecified osteoarthritis, unspecified site: Secondary | ICD-10-CM | POA: Diagnosis not present

## 2022-01-26 NOTE — Chronic Care Management (AMB) (Signed)
Chronic Care Management   CCM RN Visit Note  01/26/2022 Name: Renee [name] MRN: [id number] DOB: [birth year]  Subjective: Renee [name] is a 88 y.o. year old female who is a primary care patient of Pickard, Cammie Mcgee, MD. The care management team was consulted for assistance with disease management and care coordination needs.    Engaged with patient by telephone for follow up visit in response to provider referral for case management and/or care coordination services.   Consent to Services:  The patient was given information about Chronic Care Management services, agreed to services, and gave verbal consent prior to initiation of services.  Please see initial visit note for detailed documentation.   Patient agreed to services and verbal consent obtained.   Assessment: Review of patient past medical history, allergies, medications, health status, including review of consultants reports, laboratory and other test data, was performed as part of comprehensive evaluation and provision of chronic care management services.   SDOH (Social Determinants of Health) assessments and interventions performed:    CCM Care Plan  Allergies  Allergen Reactions   Tape Other (See Comments)    The patient's skin is VERY THIN- will TEAR EASILY!!   Voltaren [Diclofenac] Swelling and Other (See Comments)    Angioedema    Outpatient Encounter Medications as of 01/26/2022  Medication Sig   acetaminophen (TYLENOL) 500 MG tablet Take 1,000 mg by mouth at bedtime.   albuterol (PROVENTIL) (2.5 MG/3ML) 0.083% nebulizer solution Take 3 mLs (2.5 mg total) by nebulization every 6 (six) hours as needed for wheezing or shortness of breath.   albuterol (VENTOLIN HFA) 108 (90 Base) MCG/ACT inhaler Inhale 2 puffs into the lungs in the AM, noon, and bedtime for 3 days, then 2 puffs every 4 hrs as needed for shortness of breath/wheezing (for 3 days).   ALPRAZolam (XANAX) 0.5 MG tablet Take 1 tablet (0.5 mg total) by mouth 2  (two) times daily. TAKE 1/2 TO 1 TABLET BY MOUTH TWICE A DAY AS NEEDED FOR ANXIETY Strength: 0.5 mg (Patient taking differently: Take 0.5 mg by mouth 2 (two) times daily as needed for anxiety.)   amLODipine (NORVASC) 10 MG tablet TAKE 1 TABLET BY MOUTH EVERY DAY (Patient taking differently: Take 10 mg by mouth daily.)   aspirin EC 325 MG tablet Take 325 mg by mouth in the morning.   Blood Glucose Monitoring Suppl (ONE TOUCH ULTRA 2) w/Device KIT Check blood sugar once daily and as directed. Dx E11.9   budesonide-formoterol (SYMBICORT) 160-4.5 MCG/ACT inhaler Inhale 2 puffs into the lungs 2 (two) times daily.   calcium-vitamin D (OSCAL WITH D) 500-200 MG-UNIT per tablet Take 1 tablet by mouth daily.   Cholecalciferol (VITAMIN D-3) 25 MCG (1000 UT) CAPS Take 1,000 Units by mouth in the morning.   Cranberry 500 MG TABS Take 500 mg by mouth in the morning and at bedtime.   docusate sodium (COLACE) 100 MG capsule Take 200 mg by mouth at bedtime.   estradiol (ESTRACE) 0.1 MG/GM vaginal cream Place 1 g vaginally daily.   FLONASE 50 MCG/ACT nasal spray Place 2 sprays into both nostrils See admin instructions. Instill 2 sprays into each nostril at bedtime   furosemide (LASIX) 20 MG tablet Take 1 tablet (20 mg total) by mouth daily.   glipiZIDE (GLUCOTROL XL) 5 MG 24 hr tablet TAKE 1 TABLET BY MOUTH EVERY DAY WITH BREAKFAST (Patient taking differently: Take 5 mg by mouth daily with breakfast.)   glucose blood (ONETOUCH ULTRA) test  strip CHECK BLOOD SUGAR ONCE DAILY AND AS DIRECTED.   hydrALAZINE (APRESOLINE) 25 MG tablet Take 1 tablet (25 mg total) by mouth every 8 (eight) hours.   hydrochlorothiazide (MICROZIDE) 12.5 MG capsule TAKE 1 CAPSULE BY MOUTH EVERY DAY (Patient taking differently: Take 12.5 mg by mouth daily.)   JANUVIA 100 MG tablet TAKE 1 TABLET BY MOUTH EVERY DAY (Patient taking differently: Take 100 mg by mouth daily.)   Lancets (ONETOUCH DELICA PLUS [id number]) MISC CHECK BLOOD SUGAR ONCE DAILY  AND AS DIRECTED   loratadine (CLARITIN) 10 MG tablet Take 1 tablet (10 mg total) by mouth daily.   metoprolol succinate (TOPROL-XL) 25 MG 24 hr tablet Take 12.5 mg by mouth 2 (two) times daily.   mirtazapine (REMERON) 15 MG tablet Take 1 tablet (15 mg total) by mouth at bedtime.   Multiple Vitamin (MULTIVITAMIN) capsule Take 1 capsule by mouth daily.   Multiple Vitamins-Minerals (PRESERVISION AREDS 2+MULTI VIT) CAPS Take 1 capsule by mouth in the morning and at bedtime.   Olopatadine HCl (PATADAY OP) Place 1 drop into both eyes 2 (two) times daily as needed (for itching or redness).   pantoprazole (PROTONIX) 40 MG tablet Take 1 tablet (40 mg total) by mouth daily at 6 (six) AM.   pioglitazone (ACTOS) 30 MG tablet Take 1 tablet (30 mg total) by mouth daily.   sertraline (ZOLOFT) 50 MG tablet Take 1 tablet (50 mg total) by mouth daily.   solifenacin (VESICARE) 10 MG tablet Take 1 tablet (10 mg total) by mouth daily.   trandolapril (MAVIK) 4 MG tablet TAKE 1 TABLET DAILY (Patient taking differently: Take 4 mg by mouth daily.)   triamcinolone (NASACORT) 55 MCG/ACT AERO nasal inhaler Place 2 sprays into the nose daily. (Patient taking differently: Place 2 sprays into the nose See admin instructions. Instill 2 sprays into each nostril at bedtime when not using Flonase)   vitamin B-12 (CYANOCOBALAMIN) 1000 MCG tablet Take 1,000 mcg by mouth daily.    Continuous Blood Gluc Receiver (FREESTYLE LIBRE 2 READER) DEVI 1 each by Does not apply route as directed. (Patient not taking: Reported on 01/19/2022)   Continuous Blood Gluc Sensor (FREESTYLE LIBRE 2 SENSOR) MISC 1 each by Does not apply route as directed. (Patient not taking: Reported on 01/26/2022)   No facility-administered encounter medications on file as of 01/26/2022.    Patient Active Problem List   Diagnosis Date Noted   Physical deconditioning 01/19/2022   Chronic respiratory failure with hypoxia ([place]) 01/19/2022   Asthma, chronic, unspecified  asthma severity, with acute exacerbation 01/06/2022   Allergic rhinitis 01/06/2022   Chronic diastolic CHF (congestive heart failure) ([place]) 01/06/2022   Obesity (BMI 30-39.9) 01/06/2022   Acute respiratory failure with hypoxia ([place]) 01/05/2022   Bradycardia 09/01/2021   UTI (urinary tract infection) 08/29/2021   Wheezing on expiration 08/29/2021   Sepsis due to pneumonia ([place]) 06/07/2020   Pleural effusion 05/13/2020   Frequent UTI 05/13/2020   Dizziness 02/12/2020   Normocytic anemia 02/12/2020   Fall involving sidewalk curb 11/29/2019   Contusion of back 11/27/2019   Aortic atherosclerosis ([place]) 07/13/2019   CKD (chronic kidney disease) stage 4, GFR 15-29 ml/min ([place]) 07/01/2019   External hemorrhoid 01/17/2018   History of colitis 01/17/2018   Generalized weakness 12/24/2016   Diabetic retinopathy ([place]) 12/11/2016   History of CVA (cerebrovascular accident) 12/11/2016   Weakness secondary to UTI 10/21/2015   Acute renal failure superimposed on stage 3b chronic kidney disease ([place]) 10/12/2015   CAP (  community acquired pneumonia) 10/07/2015   Estrogen deficiency 08/30/2015   Mobility impaired 06/26/2011   History of retinal detachment 01/10/2011   Sleep apnea 11/28/2010   Anxiety and depression 08/25/2010   Hemiplegia, late effect of cerebrovascular disease ([place]) 07/05/2010   POSTHERPETIC NEURALGIA 11/09/2009   Renal insufficiency 06/29/2008   Chronic back pain 01/26/2008   EDEMA 01/26/2008   B12 deficiency 01/10/2007   Type 2 diabetes, controlled, with retinopathy ([place]) 11/27/2006   Essential hypertension 11/27/2006   FIBROCYSTIC BREAST DISEASE 11/27/2006   ROSACEA 11/27/2006   OSTEOARTHRITIS 11/27/2006   URINARY INCONTINENCE, MIXED 11/27/2006    Conditions to be addressed/monitored:HTN and DMII  Care Plan : RN Care Manager plan of care  Updates made by Kassie Mends, RN since 01/26/2022 12:00 AM     Problem: No plan of care established for management of chronic  disease states (DM2, HTN, CKD stage 4)   Priority: High     Long-Range Goal: Development of plan of care for chronic disease management (DM2, HTN, CKD stage 4)   Start Date: 07/10/2021  Expected End Date: 05/19/2022  Priority: High  Note:   Current Barriers:  Knowledge Deficits related to plan of care for management of HTN and DMII - patient reports she lives alone, has hired help daily from 9 am- 5 pm, has sister in law she can call for assistance if needed, and sister in law has been coming in for the evening and assisting, pt does not drive and needs assistance with ADL's such as bathing, cooking, medication oversight etc and her aide assists.  Patient reports she checks CBG once daily with readings low 200's range with 253 fasting today range, CBG has been elevated since pt hospitalized on 01/05/22 for pneumonia and taking prednisone (has finished taking a few days ago), checks blood pressure daily and reports readings " are great". Patient reports she has advanced directives with her sister Frazier Butt as HCPOA.  Patient reports home health with [employer] consultant and PT working with patient, hired help assists with walking patient and helping her to do exercises.  Patient needs assistance with ongoing teaching/ reinforcement of diabetes/ HTN management and diet.  Permission given to speak with aide [name] who reports she is trying to cook healthy foods for patient, assists her to do daily exercises and gets her outside weather permitting, reports pt is moving around more, pt is using oxygen at 2 liters at hs only.  RNCM Clinical Goal(s):  Patient will verbalize understanding of plan for management of HTN and DMII as evidenced by patient report, review of EHR  through collaboration with RN Care manager, provider, and care team.   Interventions: 1:1 collaboration with primary care provider regarding development and update of comprehensive plan of care as evidenced by provider attestation and  co-signature Inter-disciplinary care team collaboration (see longitudinal plan of care) Evaluation of current treatment plan related to  self management and patient's adherence to plan as established by provider  Hypertension Interventions: Last practice recorded BP readings:  BP Readings from Last 3 Encounters:  07/10/21 (!) 171/84  06/30/21 134/62  06/26/21 (!) 143/65  Most recent eGFR/CrCl:  Lab Results  Component Value Date   EGFR 27 (L) 05/26/2021    No components found for: CRCL  Evaluation of current treatment plan related to hypertension self management and patient's adherence to plan as established by provider Reviewed medications with patient and discussed importance of compliance Advised patient, providing education and rationale, to monitor blood pressure daily  and record, calling PCP for findings outside established parameters Reviewed low sodium diet, foods to avoid/ limit- salty snacks and fast food Reinforced importance of completing exercises daily as prescribed by PT and continue walking  Reviewed safety precautions Reviewed upcoming scheduled appointments - primary care provider 02/13/22 and Dr. Halford Chessman 02/09/22  Diabetes Interventions: Assessed patient's understanding of A1c goal: <7% Reviewed medications with patient and discussed importance of medication adherence Review of patient status, including review of consultants reports, relevant laboratory and other test results, and medications completed Reinforced carbohydrate modified diet and food choices Reinforced importance of limiting/ avoiding concentrated sweets Reviewed importance of eating 3 meals daily Pain assessment completed Talked with caregiver (permission given by pt) and reviewed CBG, BP log Reviewed importance of drinking adequate fluids preferably water Lab Results  Component Value Date   HGBA1C 8.3 (H) 05/24/2021    Patient Goals/Self-Care Activities: check blood sugar at prescribed times: once  daily check feet daily for cuts, sores or redness trim toenails straight across drink 6 to 8 glasses of water each day fill half of plate with vegetables read food labels for fat, fiber, carbohydrates and portion size wear comfortable, well-fitting shoes check blood pressure daily write blood pressure results in a log or diary keep a blood pressure log take blood pressure log to all doctor appointments take medications for blood pressure exactly as prescribed Continue to follow low sodium diet- avoid fast food and salty snacks Follow carbohydrate modified diet - avoid concentrated sweets Continue doing daily exercises prescribed by PT, continue walking and ask aide to help you Practice good hygiene to prevent UTI, drink adequate fluids, preferably water Follow up with Dr. Dennard Schaumann on 02/13/22 and Dr. Halford Chessman 02/09/22 fall prevention strategies: change position slowly, use assistive device such as walker or cane (per provider recommendations) when walking, keep walkways clear, have good lighting in room. It is important to contact your provider if you have any falls, maintain muscle strength/tone by exercise per provider recommendations.        Plan:Telephone follow up appointment with care management team member scheduled for:  03/16/22  Jacqlyn Larsen [facility], BSN RN Case Manager [facility] [phone]

## 2022-01-26 NOTE — Patient Instructions (Signed)
Visit Information  Thank you for taking time to visit with me today. Please don't hesitate to contact me if I can be of assistance to you before our next scheduled telephone appointment.  Following are the goals we discussed today:  check blood sugar at prescribed times: once daily check feet daily for cuts, sores or redness trim toenails straight across drink 6 to 8 glasses of water each day fill half of plate with vegetables read food labels for fat, fiber, carbohydrates and portion size wear comfortable, well-fitting shoes check blood pressure daily write blood pressure results in a log or diary keep a blood pressure log take blood pressure log to all doctor appointments take medications for blood pressure exactly as prescribed Continue to follow low sodium diet- avoid fast food and salty snacks Follow carbohydrate modified diet - avoid concentrated sweets Continue doing daily exercises prescribed by PT, continue walking and ask aide to help you Practice good hygiene to prevent UTI, drink adequate fluids, preferably water Follow up with Dr. Dennard Schaumann on 02/13/22 and Dr. Halford Chessman 02/09/22 fall prevention strategies: change position slowly, use assistive device such as walker or cane (per provider recommendations) when walking, keep walkways clear, have good lighting in room. It is important to contact your provider if you have any falls, maintain muscle strength/tone by exercise per provider recommendations.  Our next appointment is by telephone on 03/16/22 at 215 pm  Please call the care guide team at [phone] if you need to cancel or reschedule your appointment.   If you are experiencing a Mental Health or [place] or need someone to talk to, please call the Suicide and Crisis Lifeline: 988 call the [place] National Suicide Prevention Lifeline: 6027024654 or TTY: 617-252-3686 TTY 206-192-7931) to talk to a trained counselor call 1-800-273-TALK (toll free, 24 hour  hotline) go to [facility] [address], [place] [phone]) call 911   Patient verbalizes understanding of instructions and care plan provided today and agrees to view in [place]. Active MyChart status and patient understanding of how to access instructions and care plan via MyChart confirmed with patient.     Jacqlyn Larsen RNC, BSN RN Case Manager [facility] [phone] Community-Acquired Pneumonia, Adult Pneumonia is an infection of the lungs. It causes irritation and swelling in the airways of the lungs. Mucus and fluid may also build up inside the airways. This may cause coughing and trouble breathing. One type of pneumonia can happen while you are in a hospital. A different type can happen when you are not in a hospital (community-acquired pneumonia). What are the causes?  This condition is caused by germs (viruses, bacteria, or fungi). Some types of germs can spread from person to person. Pneumonia is not thought to spread from person to person. What increases the risk? You are more likely to develop this condition if: You have a long-term (chronic) disease, such as: Disease of the lungs. This may be chronic obstructive pulmonary disease (COPD) or asthma. Heart failure. Cystic fibrosis. Diabetes. Kidney disease. Sickle cell disease. HIV. You have other health problems, such as: Your body's defense system (immune system) is weak. A condition that may cause you to breathe in fluids from your mouth and nose. You had your spleen taken out. You do not take good care of your teeth and mouth (poor dental hygiene). You use or have used tobacco products. You travel where the germs that cause this illness are common. You are near certain animals or  the places they live. You are older than 86 years of age. What are the signs or symptoms? Symptoms of this condition include: A cough. A fever. Sweating or chills. Chest  pain, often when you breathe deeply or cough. Breathing problems, such as: Fast breathing. Trouble breathing. Shortness of breath. Feeling tired (fatigued). Muscle aches. How is this treated? Treatment for this condition depends on many things, such as: The cause of your illness. Your medicines. Your other health problems. Most adults can be treated at home. Sometimes, treatment must happen in a hospital. Treatment may include medicines to kill germs. Medicines may depend on which germ caused your illness. Very bad pneumonia is rare. If you get it, you may: Have a machine to help you breathe. Have fluid taken away from around your lungs. Follow these instructions at home: Medicines Take over-the-counter and prescription medicines only as told by your doctor. Take cough medicine only if you are losing sleep. Cough medicine can keep your body from taking mucus away from your lungs. If you were prescribed an antibiotic medicine, take it as told by your doctor. Do not stop taking the antibiotic even if you start to feel better. Lifestyle     Do not drink alcohol. Do not use any products that contain nicotine or tobacco, such as cigarettes, e-cigarettes, and chewing tobacco. If you need help quitting, ask your doctor. Eat a healthy diet. This includes a lot of vegetables, fruits, whole grains, low-fat dairy products, and low-fat (lean) protein. General instructions  Rest a lot. Sleep for at least 8 hours each night. Sleep with your head and neck raised. Put a few pillows under your head or sleep in a reclining chair. Return to your normal activities as told by your doctor. Ask your doctor what activities are safe for you. Drink enough fluid to keep your pee (urine) pale yellow. If your throat is sore, rinse your mouth often with salt water. To make salt water, dissolve -1 tsp (3-6 g) of salt in 1 cup (237 mL) of warm water. Keep all follow-up visits as told by your doctor. This is  important. How is this prevented? You can lower your risk of pneumonia by: Getting the pneumonia shot (vaccine). These shots have different types and schedules. Ask your doctor what works best for you. Think about getting this shot if: You are older than 86 years of age. You are 5-64 years of age and: You are being treated for cancer. You have long-term lung disease. You have other problems that affect your body's defense system. Ask your doctor if you have one of these. Getting your flu shot every year. Ask your doctor which type of shot is best for you. Going to the dentist as often as told. Washing your hands often with soap and water for at least 20 seconds. If you cannot use soap and water, use hand sanitizer. Contact a doctor if: You have a fever. You lose sleep because your cough medicine does not help. Get help right away if: You are short of breath and this gets worse. You have more chest pain. Your sickness gets worse. This is very serious if: You are an older adult. Your body's defense system is weak. You cough up blood. These symptoms may be an emergency. Do not wait to see if the symptoms will go away. Get medical help right away. Call your local emergency services (911 in the [place] Do not drive yourself to the hospital. Summary Pneumonia is an infection of  the lungs. Community-acquired pneumonia affects people who have not been in the hospital. Certain germs can cause this infection. This condition may be treated with medicines that kill germs. For very bad pneumonia, you may need a hospital stay and treatment to help with breathing. This information is not intended to replace advice given to you by your health care provider. Make sure you discuss any questions you have with your health care provider. Document Revised: 05/19/2019 Document Reviewed: 05/19/2019 Elsevier Patient Education  [place].

## 2022-01-30 ENCOUNTER — Inpatient Hospital Stay: Payer: Medicare HMO | Admitting: Family Medicine

## 2022-01-31 DIAGNOSIS — H26492 Other secondary cataract, left eye: Secondary | ICD-10-CM | POA: Diagnosis not present

## 2022-02-01 DIAGNOSIS — I130 Hypertensive heart and chronic kidney disease with heart failure and stage 1 through stage 4 chronic kidney disease, or unspecified chronic kidney disease: Secondary | ICD-10-CM | POA: Diagnosis not present

## 2022-02-01 DIAGNOSIS — M1990 Unspecified osteoarthritis, unspecified site: Secondary | ICD-10-CM | POA: Diagnosis not present

## 2022-02-01 DIAGNOSIS — E11319 Type 2 diabetes mellitus with unspecified diabetic retinopathy without macular edema: Secondary | ICD-10-CM | POA: Diagnosis not present

## 2022-02-01 DIAGNOSIS — G8929 Other chronic pain: Secondary | ICD-10-CM | POA: Diagnosis not present

## 2022-02-01 DIAGNOSIS — J309 Allergic rhinitis, unspecified: Secondary | ICD-10-CM | POA: Diagnosis not present

## 2022-02-01 DIAGNOSIS — E669 Obesity, unspecified: Secondary | ICD-10-CM | POA: Diagnosis not present

## 2022-02-01 DIAGNOSIS — D631 Anemia in chronic kidney disease: Secondary | ICD-10-CM | POA: Diagnosis not present

## 2022-02-01 DIAGNOSIS — F411 Generalized anxiety disorder: Secondary | ICD-10-CM | POA: Diagnosis not present

## 2022-02-01 DIAGNOSIS — J9601 Acute respiratory failure with hypoxia: Secondary | ICD-10-CM | POA: Diagnosis not present

## 2022-02-01 DIAGNOSIS — J4521 Mild intermittent asthma with (acute) exacerbation: Secondary | ICD-10-CM | POA: Diagnosis not present

## 2022-02-01 DIAGNOSIS — I69354 Hemiplegia and hemiparesis following cerebral infarction affecting left non-dominant side: Secondary | ICD-10-CM | POA: Diagnosis not present

## 2022-02-01 DIAGNOSIS — M549 Dorsalgia, unspecified: Secondary | ICD-10-CM | POA: Diagnosis not present

## 2022-02-01 DIAGNOSIS — R001 Bradycardia, unspecified: Secondary | ICD-10-CM | POA: Diagnosis not present

## 2022-02-01 DIAGNOSIS — B0229 Other postherpetic nervous system involvement: Secondary | ICD-10-CM | POA: Diagnosis not present

## 2022-02-01 DIAGNOSIS — I5032 Chronic diastolic (congestive) heart failure: Secondary | ICD-10-CM | POA: Diagnosis not present

## 2022-02-01 DIAGNOSIS — N390 Urinary tract infection, site not specified: Secondary | ICD-10-CM | POA: Diagnosis not present

## 2022-02-01 DIAGNOSIS — A419 Sepsis, unspecified organism: Secondary | ICD-10-CM | POA: Diagnosis not present

## 2022-02-01 DIAGNOSIS — F32A Depression, unspecified: Secondary | ICD-10-CM | POA: Diagnosis not present

## 2022-02-01 DIAGNOSIS — I700 Atherosclerosis of aorta: Secondary | ICD-10-CM | POA: Diagnosis not present

## 2022-02-01 DIAGNOSIS — G4730 Sleep apnea, unspecified: Secondary | ICD-10-CM | POA: Diagnosis not present

## 2022-02-01 DIAGNOSIS — N184 Chronic kidney disease, stage 4 (severe): Secondary | ICD-10-CM | POA: Diagnosis not present

## 2022-02-01 DIAGNOSIS — E538 Deficiency of other specified B group vitamins: Secondary | ICD-10-CM | POA: Diagnosis not present

## 2022-02-01 DIAGNOSIS — E1122 Type 2 diabetes mellitus with diabetic chronic kidney disease: Secondary | ICD-10-CM | POA: Diagnosis not present

## 2022-02-01 DIAGNOSIS — N3946 Mixed incontinence: Secondary | ICD-10-CM | POA: Diagnosis not present

## 2022-02-01 DIAGNOSIS — J189 Pneumonia, unspecified organism: Secondary | ICD-10-CM | POA: Diagnosis not present

## 2022-02-03 ENCOUNTER — Other Ambulatory Visit: Payer: Self-pay | Admitting: Family Medicine

## 2022-02-03 ENCOUNTER — Encounter: Payer: Self-pay | Admitting: Family Medicine

## 2022-02-05 ENCOUNTER — Other Ambulatory Visit: Payer: Self-pay

## 2022-02-05 ENCOUNTER — Other Ambulatory Visit: Payer: Self-pay | Admitting: Family Medicine

## 2022-02-05 MED ORDER — ALPRAZOLAM 0.5 MG PO TABS
0.5000 mg | ORAL_TABLET | Freq: Two times a day (BID) | ORAL | 0 refills | Status: DC
Start: 2022-02-05 — End: 2022-02-05

## 2022-02-05 MED ORDER — ALPRAZOLAM 0.5 MG PO TABS: 0.5000 mg | ORAL_TABLET | Freq: Two times a day (BID) | ORAL | 0 refills | Status: DC

## 2022-02-05 NOTE — Telephone Encounter (Signed)
Requested Prescriptions  Pending Prescriptions Disp Refills  . JANUVIA 100 MG tablet [Pharmacy Med Name: JANUVIA 100 MG TABLET] 30 tablet 2    Sig: TAKE 1 TABLET BY [place] DAY     Endocrinology:  Diabetes - DPP-4 Inhibitors Failed - 02/05/2022  9:44 AM      Failed - Cr in normal range and within 360 days    Creat  Date Value Ref Range Status  12/25/2021 1.59 (H) 0.60 - 0.95 mg/dL Final   Creatinine, Ser  Date Value Ref Range Status  01/15/2022 1.52 (H) 0.44 - 1.00 mg/dL Final   Creatinine,U  Date Value Ref Range Status  02/14/2010 286.0 mg/dL Final   Creatinine, Urine  Date Value Ref Range Status  01/06/2022 111.19 mg/dL Final    Comment:    Performed at [facility], [place] [address] [place], [place] [address]         Passed - HBA1C is between 0 and 7.9 and within 180 days    Hgb A1c MFr Bld  Date Value Ref Range Status  12/25/2021 6.8 (H) <5.7 % of total Hgb Final    Comment:    For someone without known diabetes, a hemoglobin A1c value of 6.5% or greater indicates that they may have  diabetes and this should be confirmed with a follow-up  test. . For someone with known diabetes, a value <7% indicates  that their diabetes is well controlled and a value  greater than or equal to 7% indicates suboptimal  control. A1c targets should be individualized based on  duration of diabetes, age, comorbid conditions, and  other considerations. . Currently, no consensus exists regarding use of hemoglobin A1c for diagnosis of diabetes for children. 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, without long-term current use of insulin, macular edema presence unspecified, unspecified  retinopathy severity ([place])   [place] [name], MD   4 months ago Type 2 diabetes mellitus with hyperglycemia, without long-term current use of insulin ([place])   [place] [name], MD   7 months ago Mild persistent asthma with exacerbation   [place] Noemi Chapel A, NP   7 months ago Mild persistent asthma with exacerbation   [place] Pickard, Cammie Mcgee, MD      Future Appointments            In 4 days Chesley Mires, MD [place]   In 1 week Pickard, Cammie Mcgee, MD [place], [place]   In 3 weeks Girtha Rm, NP-C [facility] at [place]           . ALPRAZolam (XANAX) 0.5 MG tablet [Pharmacy Med Name: ALPRAZOLAM 0.5 MG TABLET] 60 tablet 2    Sig: TAKE A HALF OR WHOLE TABLET TWICE A DAY AS NEEDED FOR ANXIETY     Not Delegated - Psychiatry: Anxiolytics/Hypnotics 2 Failed - 02/05/2022  9:44 AM      Failed - This refill cannot be delegated      Failed - Urine Drug Screen  completed in last 360 days      Passed - Patient is not pregnant      Passed - Valid encounter within last 6 months    Recent Outpatient Visits          1 month ago Type 2 diabetes mellitus with hyperglycemia, without long-term current use of insulin ([place])   [place] Pickard, Cammie Mcgee, MD   3 months ago 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, without long-term current use of insulin, macular edema presence unspecified, unspecified retinopathy severity ([place])   [place] [name], MD   4 months ago Type 2 diabetes mellitus with hyperglycemia, without long-term current use of insulin ([place])   [place] [name], MD   7 months ago Mild persistent asthma with exacerbation   [place] Noemi Chapel A, NP   7 months ago Mild persistent asthma with  exacerbation   [place] Pickard, Cammie Mcgee, MD      Future Appointments            In 4 days Chesley Mires, MD [place]   In 1 week Pickard, Cammie Mcgee, MD [place], [facility]   In 3 weeks Girtha Rm, NP-C [place] at [place] Refills   solifenacin (VESICARE) 10 MG tablet 30 tablet 1    Sig: TAKE 1 TABLET BY [place]     Urology:  Bladder Agents 2 Failed - 02/05/2022  9:44 AM      Failed - Cr in normal range and within 360 days    Creat  Date Value Ref Range Status  12/25/2021 1.59 (H) 0.60 - 0.95 mg/dL Final   Creatinine, Ser  Date Value Ref Range Status  01/15/2022 1.52 (H) 0.44 - 1.00 mg/dL Final   Creatinine,U  Date Value Ref Range Status  02/14/2010 286.0 mg/dL Final   Creatinine, Urine  Date Value Ref Range Status  01/06/2022 111.19 mg/dL Final    Comment:    Performed at [facility], [place] [address], [place], [place] [address]         Passed - ALT in normal range and within 360 days    ALT  Date Value Ref Range Status  01/12/2022 23 0 - 44 U/L Final         Passed - AST in normal range and within 360 days    AST  Date Value Ref Range Status  01/12/2022 17 15 - 41 U/L Final         Passed - Valid encounter within last 12 months    Recent Outpatient Visits          1 month ago Type 2 diabetes mellitus with hyperglycemia, without long-term current use of insulin ([place])   [place] Pickard, Cammie Mcgee, MD   3 months ago 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, without long-term current use of insulin, macular edema presence unspecified, unspecified retinopathy severity ([place])   [place] [name], MD   4 months ago Type 2 diabetes mellitus with hyperglycemia, without long-term current use of insulin ([place])   [place] [name], MD   7 months ago  Mild persistent asthma with exacerbation   [place] Noemi Chapel  A, NP   7 months ago Mild persistent asthma with exacerbation   [place] Pickard, Cammie Mcgee, MD      Future Appointments            In 4 days Chesley Mires, MD [facility]   In 1 week Pickard, Cammie Mcgee, MD [place], [facility]   In 3 weeks Girtha Rm, NP-C [facility] at [place]  . pioglitazone (ACTOS) 15 MG tablet [Pharmacy Med Name: PIOGLITAZONE HCL 15 MG TABLET] 90 tablet 1    Sig: TAKE 1 TABLET (15 MG TOTAL) BY MOUTH DAILY.     Endocrinology:  Diabetes - Glitazones - pioglitazone Passed - 02/05/2022  9:44 AM      Passed - HBA1C is between 0 and 7.9 and within 180 days    Hgb A1c MFr Bld  Date Value Ref Range Status  12/25/2021 6.8 (H) <5.7 % of total Hgb Final    Comment:    For someone without known diabetes, a hemoglobin A1c value of 6.5% or greater indicates that they may have  diabetes and this should be confirmed with a follow-up  test. . For someone with known diabetes, a value <7% indicates  that their diabetes is well controlled and a value  greater than or equal to 7% indicates suboptimal  control. A1c targets should be individualized based on  duration of diabetes, age, comorbid conditions, and  other considerations. . Currently, no consensus exists regarding use of hemoglobin A1c for diagnosis of diabetes for children. 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, without long-term current use of insulin, macular edema presence unspecified, unspecified retinopathy severity ([place])    [place] [name], MD   4 months ago Type 2 diabetes mellitus with hyperglycemia, without long-term current use of insulin ([place])   [place] [name], MD   7 months ago Mild persistent asthma with exacerbation   [place] Noemi Chapel A, NP   7 months ago Mild persistent asthma with exacerbation   [place] Pickard, Cammie Mcgee, MD      Future Appointments            In 4 days Chesley Mires, MD [place]   In 1 week Pickard, Cammie Mcgee, MD [place], [place]   In 3 weeks Girtha Rm, NP-C [facility] at [facility]

## 2022-02-05 NOTE — Telephone Encounter (Signed)
Actos dose has changed, Xanax is not delegated. Requested Prescriptions  Pending Prescriptions Disp Refills  . JANUVIA 100 MG tablet [Pharmacy Med Name: JANUVIA 100 MG TABLET] 30 tablet 2    Sig: TAKE 1 TABLET BY [place] DAY     Endocrinology:  Diabetes - DPP-4 Inhibitors Failed - 02/05/2022  9:44 AM      Failed - Cr in normal range and within 360 days    Creat  Date Value Ref Range Status  12/25/2021 1.59 (H) 0.60 - 0.95 mg/dL Final   Creatinine, Ser  Date Value Ref Range Status  01/15/2022 1.52 (H) 0.44 - 1.00 mg/dL Final   Creatinine,U  Date Value Ref Range Status  02/14/2010 286.0 mg/dL Final   Creatinine, Urine  Date Value Ref Range Status  01/06/2022 111.19 mg/dL Final    Comment:    Performed at [facility], [place] [address], [place], [place] [address]         Passed - HBA1C is between 0 and 7.9 and within 180 days    Hgb A1c MFr Bld  Date Value Ref Range Status  12/25/2021 6.8 (H) <5.7 % of total Hgb Final    Comment:    For someone without known diabetes, a hemoglobin A1c value of 6.5% or greater indicates that they may have  diabetes and this should be confirmed with a follow-up  test. . For someone with known diabetes, a value <7% indicates  that their diabetes is well controlled and a value  greater than or equal to 7% indicates suboptimal  control. A1c targets should be individualized based on  duration of diabetes, age, comorbid conditions, and  other considerations. . Currently, no consensus exists regarding use of hemoglobin A1c for diagnosis of diabetes for children. 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, without long-term current use of insulin,  macular edema presence unspecified, unspecified retinopathy severity ([place])   [place] [name], MD   4 months ago Type 2 diabetes mellitus with hyperglycemia, without long-term current use of insulin ([place])   [place] [name], MD   7 months ago Mild persistent asthma with exacerbation   [place] Noemi Chapel A, NP   7 months ago Mild persistent asthma with exacerbation   [place] Pickard, Cammie Mcgee, MD      Future Appointments            In 4 days Chesley Mires, MD [place]   In 1 week Pickard, Cammie Mcgee, MD [place], [place]   In 3 weeks Girtha Rm, NP-C [facility] at [place]           . solifenacin (VESICARE) 10 MG tablet [Pharmacy Med Name: SOLIFENACIN 10 MG TABLET] 30 tablet 1    Sig: TAKE 1 TABLET BY MOUTH EVERY DAY     Urology:  Bladder Agents 2 Failed - 02/05/2022  9:44 AM      Failed - Cr in normal range and within 360 days    Creat  Date  Value Ref Range Status  12/25/2021 1.59 (H) 0.60 - 0.95 mg/dL Final   Creatinine, Ser  Date Value Ref Range Status  01/15/2022 1.52 (H) 0.44 - 1.00 mg/dL Final   Creatinine,U  Date Value Ref Range Status  02/14/2010 286.0 mg/dL Final   Creatinine, Urine  Date Value Ref Range Status  01/06/2022 111.19 mg/dL Final    Comment:    Performed at [facility], [place] 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, without long-term current use of insulin, macular edema presence unspecified, unspecified retinopathy severity ([place])   [place] [name], MD   4 months ago Type 2 diabetes mellitus with hyperglycemia, without long-term current use of insulin ([place])   [place] [name], MD   7 months ago Mild persistent asthma with exacerbation   [place] Noemi Chapel A, NP   7 months ago Mild persistent asthma with exacerbation   [place] Pickard, Cammie Mcgee, MD      Future Appointments            In 4 days Chesley Mires, MD [place]   In 1 week Pickard, Cammie Mcgee, MD [place], [place]   In 3 weeks Girtha Rm, NP-C [facility] at [place]           . ALPRAZolam (XANAX) 0.5 MG tablet [Pharmacy Med Name: ALPRAZOLAM 0.5 MG TABLET] 60 tablet 2    Sig: TAKE A HALF OR WHOLE TABLET TWICE A DAY AS NEEDED FOR ANXIETY     Not Delegated - Psychiatry: Anxiolytics/Hypnotics 2 Failed - 02/05/2022  9:44 AM      Failed - This refill cannot be delegated      Failed - Urine Drug Screen completed in last 360 days      Passed - Patient is not pregnant      Passed - Valid encounter within last 6 months    Recent Outpatient Visits          1 month ago Type 2 diabetes mellitus with hyperglycemia, without long-term current use of insulin ([place])   [place] Pickard, Cammie Mcgee, MD   3 months ago 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, without long-term current use of insulin, macular edema presence unspecified, unspecified retinopathy severity ([place])   [place] [name], MD   4 months ago Type 2 diabetes mellitus with hyperglycemia, without long-term current use of insulin ([place])   [place] [name], MD   7 months ago Mild persistent asthma  with exacerbation   [facility] [name], NP   7 months ago Mild persistent asthma with exacerbation   [place] Pickard, Cammie Mcgee, MD      Future Appointments            In 4 days Chesley Mires, MD [facility]   In 1 week Pickard, Cammie Mcgee, MD [place], [facility]   In 3 weeks Girtha Rm, NP-C [facility] at [place]  . pioglitazone (ACTOS) 15 MG tablet [Pharmacy Med Name: PIOGLITAZONE HCL 15 MG TABLET] 90 tablet 1    Sig: TAKE 1 TABLET (15 MG TOTAL) BY MOUTH DAILY.     Endocrinology:  Diabetes - Glitazones - pioglitazone Passed - 02/05/2022  9:44 AM      Passed - HBA1C is between 0 and 7.9 and within 180 days    Hgb A1c MFr Bld  Date Value Ref Range Status  12/25/2021 6.8 (H) <5.7 % of total Hgb Final    Comment:    For someone without known diabetes, a hemoglobin A1c value of 6.5% or greater indicates that they may have  diabetes and this should be confirmed with a follow-up  test. . For someone with known diabetes, a value <7% indicates  that their diabetes is well controlled and a value  greater than or equal to 7% indicates suboptimal  control. A1c targets should be individualized based on  duration of diabetes, age, comorbid conditions, and  other considerations. . Currently, no consensus exists regarding use of hemoglobin A1c for diagnosis of diabetes for children. 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, without long-term current use of insulin, macular edema presence  unspecified, unspecified retinopathy severity ([place])   [place] [name], MD   4 months ago Type 2 diabetes mellitus with hyperglycemia, without long-term current use of insulin ([place])   [place] [name], MD   7 months ago Mild persistent asthma with exacerbation   [place] Noemi Chapel A, NP   7 months ago Mild persistent asthma with exacerbation   [place] Pickard, Cammie Mcgee, MD      Future Appointments            In 4 days Chesley Mires, MD [place]   In 1 week Pickard, Cammie Mcgee, MD [place], [place]   In 3 weeks Girtha Rm, NP-C [facility] at [facility]

## 2022-02-05 NOTE — Telephone Encounter (Signed)
The requested medication was prescribed when pt was in the hospital. Pt would like to have meds refill today due pt going out of town.   LOV 12/25/21 Last refill 09/01/21, #20, 0 refills  Please review, advice

## 2022-02-05 NOTE — Telephone Encounter (Signed)
Requested medication (s) are due for refill today: yes  Requested medication (s) are on the active medication list: yes  Last refill:  09/01/2021 #20 with 0 RF  Future visit scheduled: 02/13/22  Notes to clinic:  This was ordered by a hospitalist, unsure if to continue, it is not delegated, please assess.      Requested Prescriptions  Pending Prescriptions Disp Refills   ALPRAZolam (XANAX) 0.5 MG tablet [Pharmacy Med Name: ALPRAZOLAM 0.5 MG TABLET] 60 tablet 2    Sig: TAKE A HALF OR WHOLE TABLET TWICE A DAY AS NEEDED FOR ANXIETY     Not Delegated - Psychiatry: Anxiolytics/Hypnotics 2 Failed - 02/05/2022  9:44 AM      Failed - This refill cannot be delegated      Failed - Urine Drug Screen completed in last 360 days      Passed - Patient is not pregnant      Passed - Valid encounter within last 6 months    Recent Outpatient Visits           1 month ago Type 2 diabetes mellitus with hyperglycemia, without long-term current use of insulin ([place])   [place] Pickard, Cammie Mcgee, MD   3 months ago 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, without long-term current use of insulin, macular edema presence unspecified, unspecified retinopathy severity ([place])   [place] Pickard, Cammie Mcgee, MD   4 months ago Type 2 diabetes mellitus with hyperglycemia, without long-term current use of insulin ([place])   [place] [name], MD   7 months ago Mild persistent asthma with exacerbation   [place] Noemi Chapel A, NP   7 months ago Mild persistent asthma with exacerbation   [place] Pickard, Cammie Mcgee, MD       Future Appointments             In 4 days Chesley Mires, MD [place]   In 1 week Pickard, Cammie Mcgee, MD [place], [facility]   In 3 weeks Girtha Rm, NP-C [facility] at [employer] [facility]  Prescriptions Disp Refills   JANUVIA 100 MG tablet 30 tablet 2    Sig: TAKE 1 TABLET BY [place]     Endocrinology:  Diabetes - DPP-4 Inhibitors Failed - 02/05/2022  9:44 AM      Failed - Cr in normal range and within 360 days    Creat  Date Value Ref Range Status  12/25/2021 1.59 (H) 0.60 - 0.95 mg/dL Final   Creatinine, Ser  Date Value Ref Range Status  01/15/2022 1.52 (H) 0.44 - 1.00 mg/dL Final   Creatinine,U  Date Value Ref Range Status  02/14/2010 286.0 mg/dL Final   Creatinine, Urine  Date Value Ref Range Status  01/06/2022 111.19 mg/dL Final    Comment:    Performed at [facility], [place] [address] [place], [place] [address]         Passed - HBA1C is between 0 and 7.9 and within 180 days    Hgb A1c MFr Bld  Date Value Ref Range Status  12/25/2021 6.8 (H) <5.7 % of total Hgb Final    Comment:    For someone without known diabetes, a hemoglobin A1c value of 6.5% or greater indicates that they may have  diabetes and this  should be confirmed with a follow-up  test. . For someone with known diabetes, a value <7% indicates  that their diabetes is well controlled and a value  greater than or equal to 7% indicates suboptimal  control. A1c targets should be individualized based on  duration of diabetes, age, comorbid conditions, and  other considerations. . Currently, no consensus exists regarding use of hemoglobin A1c for diagnosis of diabetes for children. 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, without long-term current use of insulin, macular edema presence unspecified, unspecified retinopathy severity ([place])   [place] [name],  MD   4 months ago Type 2 diabetes mellitus with hyperglycemia, without long-term current use of insulin ([place])   [place] [name], MD   7 months ago Mild persistent asthma with exacerbation   [place] Noemi Chapel A, NP   7 months ago Mild persistent asthma with exacerbation   [place] Pickard, Cammie Mcgee, MD       Future Appointments             In 4 days Chesley Mires, MD [place]   In 1 week Pickard, Cammie Mcgee, MD [place], [facility]   In 3 weeks Girtha Rm, NP-C [facility] at [employer]             solifenacin (VESICARE) 10 MG tablet 30 tablet 1    Sig: TAKE 1 [place]     Urology:  Bladder Agents 2 Failed - 02/05/2022  9:44 AM      Failed - Cr in normal range and within 360 days    Creat  Date Value Ref Range Status  12/25/2021 1.59 (H) 0.60 - 0.95 mg/dL Final   Creatinine, Ser  Date Value Ref Range Status  01/15/2022 1.52 (H) 0.44 - 1.00 mg/dL Final   Creatinine,U  Date Value Ref Range Status  02/14/2010 286.0 mg/dL Final   Creatinine, Urine  Date Value Ref Range Status  01/06/2022 111.19 mg/dL Final    Comment:    Performed at [facility], [place] [address] [place], [place] [address]         Passed - ALT in normal range and within 360 days    ALT  Date Value Ref Range Status  01/12/2022 23 0 - 44 U/L Final         Passed - AST in normal range and within 360 days    AST  Date Value Ref Range Status  01/12/2022 17 15 - 41 U/L Final         Passed - Valid encounter within last 12 months    Recent Outpatient Visits           1 month ago Type 2 diabetes mellitus with hyperglycemia, without long-term current use of insulin ([place])   [place] Pickard, Cammie Mcgee, MD   3 months ago 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, without long-term current use of insulin, macular edema  presence unspecified, unspecified retinopathy severity ([place])   [place] Pickard, Cammie Mcgee, MD   4 months ago Type 2 diabetes mellitus with hyperglycemia, without long-term current use of insulin ([place])   [place] [name], MD   7 months ago Mild persistent asthma with exacerbation   [place] [name], NP   7 months ago Mild persistent asthma with exacerbation   [place] Pickard, Cammie Mcgee, MD       Future Appointments             In 4 days Chesley Mires, MD [facility]   In 1 week Pickard, Cammie Mcgee, MD [place], [facility]   In 3 weeks Girtha Rm, NP-C [facility] at [place] Refills   pioglitazone (ACTOS) 15 MG tablet [Pharmacy Med Name: PIOGLITAZONE HCL 15 MG TABLET] 90 tablet 1    Sig: TAKE 1 TABLET (15 MG TOTAL) BY MOUTH DAILY.     Endocrinology:  Diabetes - Glitazones - pioglitazone Passed - 02/05/2022  9:44 AM      Passed - HBA1C is between 0 and 7.9 and within 180 days    Hgb A1c MFr Bld  Date Value Ref Range Status  12/25/2021 6.8 (H) <5.7 % of total Hgb Final    Comment:    For someone without known diabetes, a hemoglobin A1c value of 6.5% or greater indicates that they may have  diabetes and this should be confirmed with a follow-up  test. . For someone with known diabetes, a value <7% indicates  that their diabetes is well controlled and a value  greater than or equal to 7% indicates suboptimal  control. A1c targets should be individualized based on  duration of diabetes, age, comorbid conditions, and  other considerations. . Currently, no consensus exists regarding use of hemoglobin A1c for diagnosis of diabetes for children. 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, without long-term current use of insulin, macular edema presence unspecified, unspecified retinopathy severity ([place])   [place] [name], MD   4 months ago Type 2 diabetes mellitus with hyperglycemia, without long-term current use of insulin ([place])   [place] [name], MD   7 months ago Mild persistent asthma with exacerbation   [place] Noemi Chapel A, NP   7 months ago Mild persistent asthma with exacerbation   [place] Pickard, Cammie Mcgee, MD       Future Appointments             In 4 days Chesley Mires, MD [place]   In 1 week Pickard, Cammie Mcgee, MD [place], [place]   In 3 weeks Girtha Rm, NP-C [facility] at [facility]

## 2022-02-06 ENCOUNTER — Telehealth: Payer: Self-pay | Admitting: Family Medicine

## 2022-02-06 NOTE — Telephone Encounter (Signed)
Left message for patient to call back and schedule Medicare Annual Wellness Visit (AWV) in office.   If not able to come in office, please offer to do virtually or by telephone.  Left office number and my jabber [phone].  Last AWV:01/13/2021  Please schedule at anytime with Nurse Health Advisor.

## 2022-02-07 ENCOUNTER — Ambulatory Visit: Payer: Medicare HMO | Admitting: Family Medicine

## 2022-02-08 DIAGNOSIS — E669 Obesity, unspecified: Secondary | ICD-10-CM | POA: Diagnosis not present

## 2022-02-08 DIAGNOSIS — E538 Deficiency of other specified B group vitamins: Secondary | ICD-10-CM | POA: Diagnosis not present

## 2022-02-08 DIAGNOSIS — A419 Sepsis, unspecified organism: Secondary | ICD-10-CM | POA: Diagnosis not present

## 2022-02-08 DIAGNOSIS — F411 Generalized anxiety disorder: Secondary | ICD-10-CM | POA: Diagnosis not present

## 2022-02-08 DIAGNOSIS — I5032 Chronic diastolic (congestive) heart failure: Secondary | ICD-10-CM | POA: Diagnosis not present

## 2022-02-08 DIAGNOSIS — E1122 Type 2 diabetes mellitus with diabetic chronic kidney disease: Secondary | ICD-10-CM | POA: Diagnosis not present

## 2022-02-08 DIAGNOSIS — M1990 Unspecified osteoarthritis, unspecified site: Secondary | ICD-10-CM | POA: Diagnosis not present

## 2022-02-08 DIAGNOSIS — J4521 Mild intermittent asthma with (acute) exacerbation: Secondary | ICD-10-CM | POA: Diagnosis not present

## 2022-02-08 DIAGNOSIS — G8929 Other chronic pain: Secondary | ICD-10-CM | POA: Diagnosis not present

## 2022-02-08 DIAGNOSIS — F32A Depression, unspecified: Secondary | ICD-10-CM | POA: Diagnosis not present

## 2022-02-08 DIAGNOSIS — R001 Bradycardia, unspecified: Secondary | ICD-10-CM | POA: Diagnosis not present

## 2022-02-08 DIAGNOSIS — N184 Chronic kidney disease, stage 4 (severe): Secondary | ICD-10-CM | POA: Diagnosis not present

## 2022-02-08 DIAGNOSIS — J309 Allergic rhinitis, unspecified: Secondary | ICD-10-CM | POA: Diagnosis not present

## 2022-02-08 DIAGNOSIS — D631 Anemia in chronic kidney disease: Secondary | ICD-10-CM | POA: Diagnosis not present

## 2022-02-08 DIAGNOSIS — N3946 Mixed incontinence: Secondary | ICD-10-CM | POA: Diagnosis not present

## 2022-02-08 DIAGNOSIS — M549 Dorsalgia, unspecified: Secondary | ICD-10-CM | POA: Diagnosis not present

## 2022-02-08 DIAGNOSIS — E11319 Type 2 diabetes mellitus with unspecified diabetic retinopathy without macular edema: Secondary | ICD-10-CM | POA: Diagnosis not present

## 2022-02-08 DIAGNOSIS — I700 Atherosclerosis of aorta: Secondary | ICD-10-CM | POA: Diagnosis not present

## 2022-02-08 DIAGNOSIS — N390 Urinary tract infection, site not specified: Secondary | ICD-10-CM | POA: Diagnosis not present

## 2022-02-08 DIAGNOSIS — J9601 Acute respiratory failure with hypoxia: Secondary | ICD-10-CM | POA: Diagnosis not present

## 2022-02-08 DIAGNOSIS — I130 Hypertensive heart and chronic kidney disease with heart failure and stage 1 through stage 4 chronic kidney disease, or unspecified chronic kidney disease: Secondary | ICD-10-CM | POA: Diagnosis not present

## 2022-02-08 DIAGNOSIS — G4730 Sleep apnea, unspecified: Secondary | ICD-10-CM | POA: Diagnosis not present

## 2022-02-08 DIAGNOSIS — J189 Pneumonia, unspecified organism: Secondary | ICD-10-CM | POA: Diagnosis not present

## 2022-02-08 DIAGNOSIS — I69354 Hemiplegia and hemiparesis following cerebral infarction affecting left non-dominant side: Secondary | ICD-10-CM | POA: Diagnosis not present

## 2022-02-08 DIAGNOSIS — B0229 Other postherpetic nervous system involvement: Secondary | ICD-10-CM | POA: Diagnosis not present

## 2022-02-09 ENCOUNTER — Ambulatory Visit: Payer: Medicare HMO | Admitting: Pulmonary Disease

## 2022-02-09 ENCOUNTER — Encounter: Payer: Self-pay | Admitting: Pulmonary Disease

## 2022-02-09 VITALS — BP 130/68 | HR 62 | Temp 98.6°F | Ht 63.0 in | Wt 173.0 lb

## 2022-02-09 DIAGNOSIS — J4540 Moderate persistent asthma, uncomplicated: Secondary | ICD-10-CM

## 2022-02-09 DIAGNOSIS — G4734 Idiopathic sleep related nonobstructive alveolar hypoventilation: Secondary | ICD-10-CM

## 2022-02-12 ENCOUNTER — Telehealth: Payer: Self-pay | Admitting: Pulmonary Disease

## 2022-02-12 DIAGNOSIS — I5032 Chronic diastolic (congestive) heart failure: Secondary | ICD-10-CM | POA: Diagnosis not present

## 2022-02-12 DIAGNOSIS — A419 Sepsis, unspecified organism: Secondary | ICD-10-CM | POA: Diagnosis not present

## 2022-02-12 DIAGNOSIS — J309 Allergic rhinitis, unspecified: Secondary | ICD-10-CM | POA: Diagnosis not present

## 2022-02-12 DIAGNOSIS — J189 Pneumonia, unspecified organism: Secondary | ICD-10-CM | POA: Diagnosis not present

## 2022-02-12 DIAGNOSIS — E1122 Type 2 diabetes mellitus with diabetic chronic kidney disease: Secondary | ICD-10-CM | POA: Diagnosis not present

## 2022-02-12 DIAGNOSIS — J4521 Mild intermittent asthma with (acute) exacerbation: Secondary | ICD-10-CM | POA: Diagnosis not present

## 2022-02-12 DIAGNOSIS — I130 Hypertensive heart and chronic kidney disease with heart failure and stage 1 through stage 4 chronic kidney disease, or unspecified chronic kidney disease: Secondary | ICD-10-CM | POA: Diagnosis not present

## 2022-02-12 MED ORDER — ALBUTEROL SULFATE HFA 108 (90 BASE) MCG/ACT IN AERS: 2.0000 | INHALATION_SPRAY | Freq: Three times a day (TID) | RESPIRATORY_TRACT | 1 refills | Status: DC

## 2022-02-12 MED ORDER — SPACER/AERO-HOLDING CHAMBERS DEVI: 1.0000 | Freq: Every day | 0 refills | Status: DC

## 2022-02-13 ENCOUNTER — Ambulatory Visit: Payer: Self-pay | Admitting: Family Medicine

## 2022-02-13 ENCOUNTER — Encounter (INDEPENDENT_AMBULATORY_CARE_PROVIDER_SITE_OTHER): Payer: Medicare HMO | Admitting: Ophthalmology

## 2022-02-15 DIAGNOSIS — F32A Depression, unspecified: Secondary | ICD-10-CM | POA: Diagnosis not present

## 2022-02-15 DIAGNOSIS — N390 Urinary tract infection, site not specified: Secondary | ICD-10-CM | POA: Diagnosis not present

## 2022-02-15 DIAGNOSIS — I130 Hypertensive heart and chronic kidney disease with heart failure and stage 1 through stage 4 chronic kidney disease, or unspecified chronic kidney disease: Secondary | ICD-10-CM | POA: Diagnosis not present

## 2022-02-15 DIAGNOSIS — E669 Obesity, unspecified: Secondary | ICD-10-CM | POA: Diagnosis not present

## 2022-02-15 DIAGNOSIS — J4521 Mild intermittent asthma with (acute) exacerbation: Secondary | ICD-10-CM | POA: Diagnosis not present

## 2022-02-15 DIAGNOSIS — E1122 Type 2 diabetes mellitus with diabetic chronic kidney disease: Secondary | ICD-10-CM | POA: Diagnosis not present

## 2022-02-15 DIAGNOSIS — M1990 Unspecified osteoarthritis, unspecified site: Secondary | ICD-10-CM | POA: Diagnosis not present

## 2022-02-15 DIAGNOSIS — E538 Deficiency of other specified B group vitamins: Secondary | ICD-10-CM | POA: Diagnosis not present

## 2022-02-15 DIAGNOSIS — N184 Chronic kidney disease, stage 4 (severe): Secondary | ICD-10-CM | POA: Diagnosis not present

## 2022-02-15 DIAGNOSIS — J189 Pneumonia, unspecified organism: Secondary | ICD-10-CM | POA: Diagnosis not present

## 2022-02-15 DIAGNOSIS — N3946 Mixed incontinence: Secondary | ICD-10-CM | POA: Diagnosis not present

## 2022-02-15 DIAGNOSIS — G8929 Other chronic pain: Secondary | ICD-10-CM | POA: Diagnosis not present

## 2022-02-15 DIAGNOSIS — I700 Atherosclerosis of aorta: Secondary | ICD-10-CM | POA: Diagnosis not present

## 2022-02-15 DIAGNOSIS — B0229 Other postherpetic nervous system involvement: Secondary | ICD-10-CM | POA: Diagnosis not present

## 2022-02-15 DIAGNOSIS — E11319 Type 2 diabetes mellitus with unspecified diabetic retinopathy without macular edema: Secondary | ICD-10-CM | POA: Diagnosis not present

## 2022-02-15 DIAGNOSIS — J9601 Acute respiratory failure with hypoxia: Secondary | ICD-10-CM | POA: Diagnosis not present

## 2022-02-15 DIAGNOSIS — G4730 Sleep apnea, unspecified: Secondary | ICD-10-CM | POA: Diagnosis not present

## 2022-02-15 DIAGNOSIS — J309 Allergic rhinitis, unspecified: Secondary | ICD-10-CM | POA: Diagnosis not present

## 2022-02-15 DIAGNOSIS — D631 Anemia in chronic kidney disease: Secondary | ICD-10-CM | POA: Diagnosis not present

## 2022-02-15 DIAGNOSIS — M549 Dorsalgia, unspecified: Secondary | ICD-10-CM | POA: Diagnosis not present

## 2022-02-15 DIAGNOSIS — F411 Generalized anxiety disorder: Secondary | ICD-10-CM | POA: Diagnosis not present

## 2022-02-15 DIAGNOSIS — R001 Bradycardia, unspecified: Secondary | ICD-10-CM | POA: Diagnosis not present

## 2022-02-15 DIAGNOSIS — A419 Sepsis, unspecified organism: Secondary | ICD-10-CM | POA: Diagnosis not present

## 2022-02-15 DIAGNOSIS — I69354 Hemiplegia and hemiparesis following cerebral infarction affecting left non-dominant side: Secondary | ICD-10-CM | POA: Diagnosis not present

## 2022-02-15 DIAGNOSIS — I5032 Chronic diastolic (congestive) heart failure: Secondary | ICD-10-CM | POA: Diagnosis not present

## 2022-02-16 ENCOUNTER — Encounter (INDEPENDENT_AMBULATORY_CARE_PROVIDER_SITE_OTHER): Payer: Medicare HMO | Admitting: Ophthalmology

## 2022-02-16 DIAGNOSIS — H353122 Nonexudative age-related macular degeneration, left eye, intermediate dry stage: Secondary | ICD-10-CM

## 2022-02-16 DIAGNOSIS — E1159 Type 2 diabetes mellitus with other circulatory complications: Secondary | ICD-10-CM | POA: Diagnosis not present

## 2022-02-16 DIAGNOSIS — H338 Other retinal detachments: Secondary | ICD-10-CM | POA: Diagnosis not present

## 2022-02-16 DIAGNOSIS — H35033 Hypertensive retinopathy, bilateral: Secondary | ICD-10-CM | POA: Diagnosis not present

## 2022-02-16 DIAGNOSIS — Z7984 Long term (current) use of oral hypoglycemic drugs: Secondary | ICD-10-CM

## 2022-02-16 DIAGNOSIS — H35371 Puckering of macula, right eye: Secondary | ICD-10-CM | POA: Diagnosis not present

## 2022-02-16 DIAGNOSIS — H43812 Vitreous degeneration, left eye: Secondary | ICD-10-CM | POA: Diagnosis not present

## 2022-02-16 DIAGNOSIS — I10 Essential (primary) hypertension: Secondary | ICD-10-CM

## 2022-02-16 DIAGNOSIS — A403 Sepsis due to Streptococcus pneumoniae: Secondary | ICD-10-CM | POA: Diagnosis not present

## 2022-02-21 DIAGNOSIS — N1832 Chronic kidney disease, stage 3b: Secondary | ICD-10-CM | POA: Diagnosis not present

## 2022-02-21 DIAGNOSIS — N2581 Secondary hyperparathyroidism of renal origin: Secondary | ICD-10-CM | POA: Diagnosis not present

## 2022-02-21 DIAGNOSIS — R600 Localized edema: Secondary | ICD-10-CM | POA: Diagnosis not present

## 2022-02-21 DIAGNOSIS — I10 Essential (primary) hypertension: Secondary | ICD-10-CM | POA: Diagnosis not present

## 2022-02-22 DIAGNOSIS — E11319 Type 2 diabetes mellitus with unspecified diabetic retinopathy without macular edema: Secondary | ICD-10-CM | POA: Diagnosis not present

## 2022-02-22 DIAGNOSIS — F32A Depression, unspecified: Secondary | ICD-10-CM | POA: Diagnosis not present

## 2022-02-22 DIAGNOSIS — E669 Obesity, unspecified: Secondary | ICD-10-CM | POA: Diagnosis not present

## 2022-02-22 DIAGNOSIS — F411 Generalized anxiety disorder: Secondary | ICD-10-CM | POA: Diagnosis not present

## 2022-02-22 DIAGNOSIS — J9601 Acute respiratory failure with hypoxia: Secondary | ICD-10-CM | POA: Diagnosis not present

## 2022-02-22 DIAGNOSIS — G8929 Other chronic pain: Secondary | ICD-10-CM | POA: Diagnosis not present

## 2022-02-22 DIAGNOSIS — I69354 Hemiplegia and hemiparesis following cerebral infarction affecting left non-dominant side: Secondary | ICD-10-CM | POA: Diagnosis not present

## 2022-02-22 DIAGNOSIS — J189 Pneumonia, unspecified organism: Secondary | ICD-10-CM | POA: Diagnosis not present

## 2022-02-22 DIAGNOSIS — J309 Allergic rhinitis, unspecified: Secondary | ICD-10-CM | POA: Diagnosis not present

## 2022-02-22 DIAGNOSIS — G4730 Sleep apnea, unspecified: Secondary | ICD-10-CM | POA: Diagnosis not present

## 2022-02-22 DIAGNOSIS — I700 Atherosclerosis of aorta: Secondary | ICD-10-CM | POA: Diagnosis not present

## 2022-02-22 DIAGNOSIS — I130 Hypertensive heart and chronic kidney disease with heart failure and stage 1 through stage 4 chronic kidney disease, or unspecified chronic kidney disease: Secondary | ICD-10-CM | POA: Diagnosis not present

## 2022-02-22 DIAGNOSIS — N3946 Mixed incontinence: Secondary | ICD-10-CM | POA: Diagnosis not present

## 2022-02-22 DIAGNOSIS — E1122 Type 2 diabetes mellitus with diabetic chronic kidney disease: Secondary | ICD-10-CM | POA: Diagnosis not present

## 2022-02-22 DIAGNOSIS — J4521 Mild intermittent asthma with (acute) exacerbation: Secondary | ICD-10-CM | POA: Diagnosis not present

## 2022-02-22 DIAGNOSIS — A419 Sepsis, unspecified organism: Secondary | ICD-10-CM | POA: Diagnosis not present

## 2022-02-22 DIAGNOSIS — I5032 Chronic diastolic (congestive) heart failure: Secondary | ICD-10-CM | POA: Diagnosis not present

## 2022-02-22 DIAGNOSIS — R001 Bradycardia, unspecified: Secondary | ICD-10-CM | POA: Diagnosis not present

## 2022-02-22 DIAGNOSIS — N390 Urinary tract infection, site not specified: Secondary | ICD-10-CM | POA: Diagnosis not present

## 2022-02-22 DIAGNOSIS — B0229 Other postherpetic nervous system involvement: Secondary | ICD-10-CM | POA: Diagnosis not present

## 2022-02-22 DIAGNOSIS — M549 Dorsalgia, unspecified: Secondary | ICD-10-CM | POA: Diagnosis not present

## 2022-02-22 DIAGNOSIS — D631 Anemia in chronic kidney disease: Secondary | ICD-10-CM | POA: Diagnosis not present

## 2022-02-22 DIAGNOSIS — N184 Chronic kidney disease, stage 4 (severe): Secondary | ICD-10-CM | POA: Diagnosis not present

## 2022-02-22 DIAGNOSIS — M1990 Unspecified osteoarthritis, unspecified site: Secondary | ICD-10-CM | POA: Diagnosis not present

## 2022-02-22 DIAGNOSIS — E538 Deficiency of other specified B group vitamins: Secondary | ICD-10-CM | POA: Diagnosis not present

## 2022-02-28 ENCOUNTER — Encounter: Payer: Self-pay | Admitting: Family Medicine

## 2022-02-28 ENCOUNTER — Ambulatory Visit (INDEPENDENT_AMBULATORY_CARE_PROVIDER_SITE_OTHER): Payer: Medicare HMO | Admitting: Family Medicine

## 2022-02-28 VITALS — BP 116/62 | HR 56 | Temp 97.1°F | Ht 63.0 in | Wt 173.2 lb

## 2022-02-28 DIAGNOSIS — Z8673 Personal history of transient ischemic attack (TIA), and cerebral infarction without residual deficits: Secondary | ICD-10-CM

## 2022-02-28 DIAGNOSIS — R5381 Other malaise: Secondary | ICD-10-CM

## 2022-02-28 DIAGNOSIS — E11319 Type 2 diabetes mellitus with unspecified diabetic retinopathy without macular edema: Secondary | ICD-10-CM | POA: Diagnosis not present

## 2022-02-28 DIAGNOSIS — Z7409 Other reduced mobility: Secondary | ICD-10-CM

## 2022-02-28 DIAGNOSIS — N184 Chronic kidney disease, stage 4 (severe): Secondary | ICD-10-CM | POA: Diagnosis not present

## 2022-02-28 DIAGNOSIS — I5032 Chronic diastolic (congestive) heart failure: Secondary | ICD-10-CM | POA: Diagnosis not present

## 2022-02-28 DIAGNOSIS — R531 Weakness: Secondary | ICD-10-CM | POA: Diagnosis not present

## 2022-02-28 DIAGNOSIS — Z87440 Personal history of urinary (tract) infections: Secondary | ICD-10-CM | POA: Diagnosis not present

## 2022-02-28 LAB — CBC WITH DIFFERENTIAL/PLATELET
Basophils Absolute: 0.1 10*3/uL (ref 0.0–0.1)
Basophils Relative: 1 % (ref 0.0–3.0)
Eosinophils Absolute: 0.3 10*3/uL (ref 0.0–0.7)
Eosinophils Relative: 5.5 % — ABNORMAL HIGH (ref 0.0–5.0)
HCT: 32.6 % — ABNORMAL LOW (ref 36.0–46.0)
Hemoglobin: 10.6 g/dL — ABNORMAL LOW (ref 12.0–15.0)
Lymphocytes Relative: 12.7 % (ref 12.0–46.0)
Lymphs Abs: 0.8 10*3/uL (ref 0.7–4.0)
MCHC: 32.5 g/dL (ref 30.0–36.0)
MCV: 87.4 fl (ref 78.0–100.0)
Monocytes Absolute: 0.4 10*3/uL (ref 0.1–1.0)
Monocytes Relative: 5.6 % (ref 3.0–12.0)
Neutro Abs: 4.7 10*3/uL (ref 1.4–7.7)
Neutrophils Relative %: 75.2 % (ref 43.0–77.0)
Platelets: 173 10*3/uL (ref 150.0–400.0)
RBC: 3.73 Mil/uL — ABNORMAL LOW (ref 3.87–5.11)
RDW: 14.9 % (ref 11.5–15.5)
WBC: 6.2 10*3/uL (ref 4.0–10.5)

## 2022-02-28 LAB — COMPREHENSIVE METABOLIC PANEL
ALT: 10 U/L (ref 0–35)
AST: 14 U/L (ref 0–37)
Albumin: 3.6 g/dL (ref 3.5–5.2)
Alkaline Phosphatase: 74 U/L (ref 39–117)
BUN: 25 mg/dL — ABNORMAL HIGH (ref 6–23)
CO2: 30 mEq/L (ref 19–32)
Calcium: 9 mg/dL (ref 8.4–10.5)
Chloride: 103 mEq/L (ref 96–112)
Creatinine, Ser: 1.38 mg/dL — ABNORMAL HIGH (ref 0.40–1.20)
GFR: 33.59 mL/min — ABNORMAL LOW (ref >60.00)
Glucose, Bld: 222 mg/dL — ABNORMAL HIGH (ref 70–99)
Potassium: 4.8 mEq/L (ref 3.5–5.1)
Sodium: 138 mEq/L (ref 135–145)
Total Bilirubin: 0.3 mg/dL (ref 0.2–1.2)
Total Protein: 6 g/dL (ref 6.0–8.3)

## 2022-02-28 LAB — HEMOGLOBIN A1C: Hgb A1c MFr Bld: 7.9 % — ABNORMAL HIGH (ref 4.6–6.5)

## 2022-02-28 LAB — TSH: TSH: 3.16 u[IU]/mL (ref 0.35–5.50)

## 2022-02-28 NOTE — Patient Instructions (Signed)
Referrals placed today include:  Urologist with Dr. Felipa Eth Physical therapy -outpatient in [place] may continue the cephalexin until you see Dr. Felipa Eth if you would like.  Continue on melatonin 5 mg in the evening.  Continue current medications  Keep an eye on blood sugars at home and let see if they start trending lower.  We will be in touch with your lab results.

## 2022-02-28 NOTE — Assessment & Plan Note (Signed)
Under the care of nephrologist. Continue to monitor and avoid nephrotoxic medications.

## 2022-02-28 NOTE — Progress Notes (Signed)
New Patient Office Visit  Subjective    Patient ID: Renee [name], female    DOB: 1929-02-02  Age: 88 y.o. MRN: [id number]  CC:  Chief Complaint  Patient presents with   Medication Problem    Wants to discuss about an antibiotics (cephalexin), is she need to stop taking it    Wheezing    Been taking treatment with albuterol but it is not helping     HPI Renee [name] presents to establish care. Previous medical care: Dr. Dennard Schaumann  Her caregiver is with her today. Caregiver states she is with patient from 7 am- 7:30 pm.  She has a sister who also helps care for her.   Uses a walker at home. In a wheelchair in the office.   Getting PT at home once weekly for generalized weakness and deconditioning. She prefers to go for outpatient rehab and requests PT referral in [place].   Stopped alprazolam over a month ago. She was taking it for sleep.  She is now taking melatonin 5 mg in the evening. This works well.   She is also taking mirtazapine 15 mg at bedtime.    Pneumonia in early June 2023 with associated sepsis. Resolution of pna on XR prior to discharge.  Wheezing and coughing since having pneumonia. Using Symbicort and albuterol inhaler.  Under the care of Dr. Halford Chessman.    Recurrent UTI- She has been taking Keflex for recurrent UTI per EMR by Dr. Dennard Schaumann from 10/2020. Requests referral to Dr. Felipa Eth for this. Would like to stop preventive antibiotic.   Diabetes- Hgb A1c 6.8% in May 2023. Not fasting today.   Caregiver reports elevated blood sugars since being on oral steroids for CAP recently. FBS 212- 280  Taking Actos 30 mg daily, Januvia 100 mg daily, glipizide XL 5 mg daily   Stage 4 renal disease and is under the care of nephrologist   Eye exam scheduled [place]      Outpatient Encounter Medications as of 02/28/2022  Medication Sig   acetaminophen (TYLENOL) 500 MG tablet Take 1,000 mg by mouth at bedtime.   albuterol (PROVENTIL) (2.5 MG/3ML) 0.083%  nebulizer solution Take 3 mLs (2.5 mg total) by nebulization every 6 (six) hours as needed for wheezing or shortness of breath.   albuterol (VENTOLIN HFA) 108 (90 Base) MCG/ACT inhaler Inhale 2 puffs into the lungs in the morning, at noon, and at bedtime.   amLODipine (NORVASC) 10 MG tablet TAKE 1 TABLET BY MOUTH EVERY DAY (Patient taking differently: Take 10 mg by mouth daily.)   aspirin EC 325 MG tablet Take 325 mg by mouth in the morning.   budesonide-formoterol (SYMBICORT) 160-4.5 MCG/ACT inhaler Inhale 2 puffs into the lungs 2 (two) times daily.   calcium-vitamin D (OSCAL WITH D) 500-200 MG-UNIT per tablet Take 1 tablet by mouth daily.   Cholecalciferol (VITAMIN D-3) 25 MCG (1000 UT) CAPS Take 1,000 Units by mouth in the morning.   Continuous Blood Gluc Receiver (FREESTYLE LIBRE 2 READER) DEVI 1 each by Does not apply route as directed.   Continuous Blood Gluc Sensor (FREESTYLE LIBRE 2 SENSOR) MISC 1 each by Does not apply route as directed.   Cranberry 500 MG TABS Take 500 mg by mouth in the morning and at bedtime.   docusate sodium (COLACE) 100 MG capsule Take 200 mg by mouth at bedtime.   estradiol (ESTRACE) 0.1 MG/GM vaginal cream Place 1 g vaginally daily.   FLONASE 50 MCG/ACT nasal spray Place 2 sprays  into both nostrils See admin instructions. Instill 2 sprays into each nostril at bedtime   furosemide (LASIX) 20 MG tablet Take 1 tablet (20 mg total) by mouth daily.   glipiZIDE (GLUCOTROL XL) 5 MG 24 hr tablet TAKE 1 TABLET BY MOUTH EVERY DAY WITH BREAKFAST (Patient taking differently: Take 5 mg by mouth daily with breakfast.)   glucose blood (ONETOUCH ULTRA) test strip CHECK BLOOD SUGAR ONCE DAILY AND AS DIRECTED.   hydrALAZINE (APRESOLINE) 25 MG tablet Take 1 tablet (25 mg total) by mouth every 8 (eight) hours.   hydrochlorothiazide (MICROZIDE) 12.5 MG capsule TAKE 1 CAPSULE BY MOUTH EVERY DAY (Patient taking differently: Take 12.5 mg by mouth daily.)   JANUVIA 100 MG tablet TAKE 1  TABLET BY MOUTH EVERY DAY   Lancets (ONETOUCH DELICA PLUS [id number]) MISC CHECK BLOOD SUGAR ONCE DAILY AND AS DIRECTED   loratadine (CLARITIN) 10 MG tablet Take 1 tablet (10 mg total) by mouth daily.   metoprolol succinate (TOPROL-XL) 25 MG 24 hr tablet Take 12.5 mg by mouth 2 (two) times daily.   mirtazapine (REMERON) 15 MG tablet Take 1 tablet (15 mg total) by mouth at bedtime.   Multiple Vitamin (MULTIVITAMIN) capsule Take 1 capsule by mouth daily.   Multiple Vitamins-Minerals (PRESERVISION AREDS 2+MULTI VIT) CAPS Take 1 capsule by mouth in the morning and at bedtime.   Olopatadine HCl (PATADAY OP) Place 1 drop into both eyes 2 (two) times daily as needed (for itching or redness).   pantoprazole (PROTONIX) 40 MG tablet Take 1 tablet (40 mg total) by mouth daily at 6 (six) AM.   pioglitazone (ACTOS) 30 MG tablet Take 1 tablet (30 mg total) by mouth daily.   sertraline (ZOLOFT) 50 MG tablet Take 1 tablet (50 mg total) by mouth daily.   solifenacin (VESICARE) 10 MG tablet TAKE 1 TABLET BY MOUTH EVERY DAY   Spacer/Aero-Holding Chambers DEVI 1 each by Does not apply route at bedtime. Use with inhaler.   trandolapril (MAVIK) 4 MG tablet TAKE 1 TABLET DAILY (Patient taking differently: Take 4 mg by mouth daily.)   triamcinolone (NASACORT) 55 MCG/ACT AERO nasal inhaler Place 2 sprays into the nose daily. (Patient taking differently: Place 2 sprays into the nose See admin instructions. Instill 2 sprays into each nostril at bedtime when not using Flonase)   vitamin B-12 (CYANOCOBALAMIN) 1000 MCG tablet Take 1,000 mcg by mouth daily.    Blood Glucose Monitoring Suppl (ONE TOUCH ULTRA 2) w/Device KIT Check blood sugar once daily and as directed. Dx E11.9 (Patient not taking: Reported on 02/28/2022)   CRANBERRY PO Take by mouth.   [DISCONTINUED] ALPRAZolam (XANAX) 0.5 MG tablet Take 1 tablet (0.5 mg total) by mouth 2 (two) times daily. TAKE 1/2 TO 1 TABLET BY MOUTH TWICE A DAY AS NEEDED FOR ANXIETY Strength:  0.5 mg (Patient not taking: Reported on 02/28/2022)   No facility-administered encounter medications on file as of 02/28/2022.    Past Medical History:  Diagnosis Date   Angioedema    possibly from voltaren   Bronchopneumonia 12/11/2016   Degenerative disc disease    Diabetes mellitus    type II   Hyperlipidemia    Hypertension    LVH (left ventricular hypertrophy)    and atrial enlargement by echo in past with nl EF   Nasal pruritis    Osteoarthritis    Osteopenia    Renal insufficiency    Sleep apnea    Stroke ([place]) 05/2010   Small vessel sobcortical (in  Point trial) with Dr Leonie Man, residual L hemiparesis   Vitamin B 12 deficiency 04/08    Past Surgical History:  Procedure Laterality Date   ABDOMINAL HYSTERECTOMY     BSO-fibroids   APPENDECTOMY     BACK SURGERY     COLON SURGERY     due to punctured intestines   EYE SURGERY     cataract extraction   IR THORACENTESIS ASP PLEURAL SPACE W/IMG GUIDE  06/07/2020   KNEE SURGERY     arthroscope   PARS PLANA VITRECTOMY  07/31/2011   Procedure: PARS PLANA VITRECTOMY WITH 25 GAUGE;  Surgeon: [name], MD;  Location: [facility];  Service: Ophthalmology;  Laterality: Right;  REMOVAL OF SILICONE OIL AND LASER RIGHT EYE   RETINAL DETACHMENT SURGERY  02/18/11   times 2   SPINE SURGERY  08/09   spinal decompression surgery    Family History  Problem Relation Age of Onset   COPD Brother    Cancer Sister        brain tumor with hemmorhage   Heart disease Sister        CAD    Social History   Socioeconomic History   Marital status: Widowed    Spouse name: Not on file   Number of children: Not on file   Years of education: Not on file   Highest education level: Not on file  Occupational History   Not on file  Tobacco Use   Smoking status: Never    Passive exposure: Never   Smokeless tobacco: Never  Vaping Use   Vaping Use: Not on file  Substance and Sexual Activity   Alcohol use: No    Alcohol/week: 0.0 standard  drinks of alcohol   Drug use: No   Sexual activity: Never  Other Topics Concern   Not on file  Social History Narrative   Not on file   Social Determinants of Health   Financial Resource Strain: Low Risk  (01/13/2021)   Overall Financial Resource Strain (CARDIA)    Difficulty of Paying Living Expenses: Not hard at all  Food Insecurity: No Food Insecurity (07/10/2021)   Hunger Vital Sign    Worried About Running Out of Food in the Last Year: Never true    [place] in the Last Year: Never true  Transportation Needs: No Transportation Needs (07/10/2021)   PRAPARE - Hydrologist (Medical): No    Lack of Transportation (Non-Medical): No  Physical Activity: Inactive (01/13/2021)   Exercise Vital Sign    Days of Exercise per Week: 0 days    Minutes of Exercise per Session: 0 min  Stress: No Stress Concern Present (01/13/2021)   [place]    Feeling of Stress : Not at all  Social Connections: Socially Isolated (01/13/2021)   Social Connection and Isolation Panel [NHANES]    Frequency of Communication with Friends and Family: More than three times a week    Frequency of Social Gatherings with Friends and Family: Once a week    Attends Religious Services: Never    Marine scientist or Organizations: No    Attends Archivist Meetings: Never    Marital Status: Widowed  Intimate Partner Violence: Not At Risk (01/13/2021)   Humiliation, Afraid, Rape, and Kick questionnaire    Fear of Current or Ex-Partner: No    Emotionally Abused: No    Physically Abused: No  Sexually Abused: No    ROS Pertinent positives and negatives in the history of present illness.      Objective    BP 116/62 (BP Location: Left Arm, Patient Position: Sitting, Cuff Size: Large)   Pulse (!) 56   Temp (!) 97.1 F (36.2 C) (Temporal)   Ht _0  (1.6 m)   Wt 173 lb 4 oz (78.6 kg)   SpO2 92%    BMI 30.69 kg/m   Physical Exam Constitutional:      General: She is not in acute distress.    Appearance: She is not ill-appearing.  Eyes:     Conjunctiva/sclera: Conjunctivae normal.  Cardiovascular:     Rate and Rhythm: Normal rate.  Pulmonary:     Effort: Pulmonary effort is normal.     Breath sounds: Normal breath sounds.  Neurological:     Mental Status: She is alert. Mental status is at baseline.         Assessment & Plan:   Problem List Items Addressed This Visit       Cardiovascular and Mediastinum   Chronic diastolic CHF (congestive heart failure) ([place])    Euvolemic today. Check labs and follow up      Relevant Orders   Ambulatory referral to Physical Therapy     Endocrine   Type 2 diabetes, controlled, with retinopathy ([place])    Denies being on insulin, only oral diabetes medications.  Per caregiver FBS is elevated since coming home from hospital on oral steroids.  Check labs including A1c today.  Continue current medication regimen and discuss starting back on insulin if needed.  She has an eye exam scheduled.       Relevant Orders   CBC with Differential/Platelet (Completed)   Comprehensive metabolic panel (Completed)   TSH (Completed)   Hemoglobin A1c (Completed)     Genitourinary   CKD (chronic kidney disease) stage 4, GFR 15-29 ml/min (HCC)    Under the care of nephrologist. Continue to monitor and avoid nephrotoxic medications.       Relevant Orders   Comprehensive metabolic panel (Completed)     Other   Generalized weakness - Primary   Relevant Orders   CBC with Differential/Platelet (Completed)   Comprehensive metabolic panel (Completed)   Ambulatory referral to Physical Therapy   History of CVA (cerebrovascular accident)   Relevant Orders   Ambulatory referral to Physical Therapy   History of recurrent UTIs    Apparently has been Keflex as preventive for more than one year. She would like to stop but wants to see a urologist.  Requests referral to Dr. Felipa Eth.       Relevant Orders   Ambulatory referral to Urology   Mobility impaired   Relevant Orders   Ambulatory referral to Physical Therapy   Physical deconditioning    No longer wants home PT. Referral to outpatient PT per request.       Relevant Orders   Ambulatory referral to Physical Therapy   Visit time 35 minutes in face to face communication with patient and coordination of care, additional 10 minutes spent in record review, coordination or care, ordering tests, communicating/referring to other healthcare professionals, documenting in medical records all on the same day of the visit for total time 45 minutes spent on the visit.    No follow-ups on file.   Harland Dingwall, NP-C

## 2022-02-28 NOTE — Assessment & Plan Note (Signed)
Euvolemic today. Check labs and follow up

## 2022-02-28 NOTE — Assessment & Plan Note (Signed)
Denies being on insulin, only oral diabetes medications.  Per caregiver FBS is elevated since coming home from hospital on oral steroids.  Check labs including A1c today.  Continue current medication regimen and discuss starting back on insulin if needed.  She has an eye exam scheduled.

## 2022-02-28 NOTE — Assessment & Plan Note (Signed)
Apparently has been Keflex as preventive for more than one year. She would like to stop but wants to see a urologist. Requests referral to Dr. Felipa Eth.

## 2022-02-28 NOTE — Assessment & Plan Note (Signed)
No longer wants home PT. Referral to outpatient PT per request.

## 2022-03-01 NOTE — Progress Notes (Signed)
Needs follow up ov to discuss diabetes. Per the hospital d/c summary on 01/16/2022, she was advised to continue insulin at home. Does she have the Novolog or Semglee at home? We can discuss this next week. Please schedule in slot before chart review.

## 2022-03-06 ENCOUNTER — Ambulatory Visit (INDEPENDENT_AMBULATORY_CARE_PROVIDER_SITE_OTHER): Payer: Medicare HMO | Admitting: Urology

## 2022-03-06 ENCOUNTER — Encounter: Payer: Self-pay | Admitting: Urology

## 2022-03-06 VITALS — BP 173/69 | HR 55

## 2022-03-06 DIAGNOSIS — Z87440 Personal history of urinary (tract) infections: Secondary | ICD-10-CM | POA: Diagnosis not present

## 2022-03-06 DIAGNOSIS — R32 Unspecified urinary incontinence: Secondary | ICD-10-CM

## 2022-03-06 LAB — MICROSCOPIC EXAMINATION
Epithelial Cells (non renal): >10 /hpf — AB (ref 0–10)
RBC, Urine: NONE SEEN /hpf (ref 0–2)
Renal Epithel, UA: NONE SEEN /hpf

## 2022-03-06 LAB — URINALYSIS, ROUTINE W REFLEX MICROSCOPIC
Bilirubin, UA: NEGATIVE
Glucose, UA: NEGATIVE
Ketones, UA: NEGATIVE
Nitrite, UA: NEGATIVE
Protein,UA: NEGATIVE
RBC, UA: NEGATIVE
Specific Gravity, UA: 1.015 (ref 1.005–1.030)
Urobilinogen, Ur: 0.2 mg/dL (ref 0.2–1.0)
pH, UA: 5 (ref 5.0–7.5)

## 2022-03-06 LAB — BLADDER SCAN AMB NON-IMAGING: Scan Result: 39

## 2022-03-06 NOTE — Progress Notes (Signed)
Being   Assessment: 1. History of UTI   2. Urinary incontinence, unspecified type     Plan: I reviewed the patient's records available urine culture results. Methods to reduce the risk of UTIs discussed including increased fluid intake, timed and double voiding, daily cranberry supplement, and daily probiotics. Discontinue daily Keflex Continue solifenacin 10 mg daily Return to office in 4-6 weeks  Chief Complaint:  Chief Complaint  Patient presents with   Recurrent UTI    History of Present Illness:  Renee [name] is a 88 y.o. year old female who is seen in consultation from [place], [place], NP-C   for evaluation of frequent UTIs.  She has a history of frequent urinary tract infections.  She does not have typical UTI symptoms.  Her UTIs typically present with generalized malaise and confusion.  No dysuria or gross hematuria.  No flank pain, fever, or chills.  Urine culture results: 1/22 mixed flora 2/22 Klebsiella 3/22 <10K colonies 4/22 Citrobacter 5/22 no growth 7/22 multiple species 9/22 E. coli  She has been on daily cephalexin for >1-year for UTI prevention. She is not having any UTI symptoms at the present time.  She has urinary incontinence.  She is currently on Vesicare 10 mg daily.  Her daytime symptoms are fairly well controlled.  She does have incontinence at night.  She has previously seen Dr. Abner Greenspan at [facility] urology.  Past Medical History:  Past Medical History:  Diagnosis Date   Angioedema    possibly from voltaren   Bronchopneumonia 12/11/2016   Degenerative disc disease    Diabetes mellitus    type II   Hyperlipidemia    Hypertension    LVH (left ventricular hypertrophy)    and atrial enlargement by echo in past with nl EF   Nasal pruritis    Osteoarthritis    Osteopenia    Renal insufficiency    Sleep apnea    Stroke ([place]) 05/2010   Small vessel sobcortical (in [facility] trial) with Dr Leonie Man, residual L hemiparesis   Vitamin B 12 deficiency  04/08    Past Surgical History:  Past Surgical History:  Procedure Laterality Date   ABDOMINAL HYSTERECTOMY     BSO-fibroids   APPENDECTOMY     BACK SURGERY     COLON SURGERY     due to punctured intestines   EYE SURGERY     cataract extraction   IR THORACENTESIS ASP PLEURAL SPACE W/IMG GUIDE  06/07/2020   KNEE SURGERY     arthroscope   PARS PLANA VITRECTOMY  07/31/2011   Procedure: PARS PLANA VITRECTOMY WITH 25 GAUGE;  Surgeon: [name], MD;  Location: [facility];  Service: Ophthalmology;  Laterality: Right;  REMOVAL OF SILICONE OIL AND LASER RIGHT EYE   RETINAL DETACHMENT SURGERY  02/18/11   times 2   SPINE SURGERY  08/09   spinal decompression surgery    Allergies:  Allergies  Allergen Reactions   Tape Other (See Comments)    The patient's skin is VERY THIN- will TEAR EASILY!!   Voltaren [Diclofenac] Swelling and Other (See Comments)    Angioedema    Family History:  Family History  Problem Relation Age of Onset   COPD Brother    Cancer Sister        brain tumor with hemmorhage   Heart disease Sister        CAD    Social History:  Social History   Tobacco Use   Smoking status: Never  Passive exposure: Never   Smokeless tobacco: Never  Substance Use Topics   Alcohol use: No    Alcohol/week: 0.0 standard drinks of alcohol   Drug use: No    Review of symptoms:  Constitutional:  Negative for unexplained weight loss, night sweats, fever, chills ENT:  Negative for nose bleeds, sinus pain, painful swallowing CV:  Negative for chest pain, shortness of breath, exercise intolerance, palpitations, loss of consciousness Resp:  Negative for cough, wheezing, shortness of breath GI:  Negative for nausea, vomiting, diarrhea, bloody stools GU:  Positives noted in HPI; otherwise negative for gross hematuria, dysuria Neuro:  Negative for seizures, poor balance, limb weakness, slurred speech Psych:  Negative for lack of energy, depression, anxiety Endocrine:   Negative for polydipsia, polyuria, symptoms of hypoglycemia (dizziness, hunger, sweating) Hematologic:  Negative for anemia, purpura, petechia, prolonged or excessive bleeding, use of anticoagulants  Allergic:  Negative for difficulty breathing or choking as a result of exposure to anything; no shellfish allergy; no allergic response (rash/itch) to materials, foods  Physical exam: BP (!) 173/69   Pulse (!) 55  GENERAL APPEARANCE:  Well appearing, well developed, well nourished, NAD HEENT: Atraumatic, Normocephalic, oropharynx clear. NECK: Supple without lymphadenopathy or thyromegaly. LUNGS: Clear to auscultation bilaterally. HEART: Regular Rate and Rhythm without murmurs, gallops, or rubs. ABDOMEN: Soft, non-tender, No Masses. EXTREMITIES: Moves all extremities well.  Without clubbing, cyanosis, or edema. NEUROLOGIC:  Alert and oriented x 3, in wheelchair,  CN II-XII grossly intact.  MENTAL STATUS:  Appropriate. BACK:  Non-tender to palpation.  No CVAT SKIN:  Warm, dry and intact.    Results: U/A: 11-30 WBC, >10 epithelial cells, mod bacteria  Results for orders placed or performed in visit on 03/06/22 (from the past 24 hour(s))  BLADDER SCAN AMB NON-IMAGING   Collection Time: 03/06/22  3:21 PM  Result Value Ref Range   Scan Result 39

## 2022-03-06 NOTE — Progress Notes (Signed)
post void residual =16m

## 2022-03-07 DIAGNOSIS — M1990 Unspecified osteoarthritis, unspecified site: Secondary | ICD-10-CM | POA: Diagnosis not present

## 2022-03-07 DIAGNOSIS — I5032 Chronic diastolic (congestive) heart failure: Secondary | ICD-10-CM | POA: Diagnosis not present

## 2022-03-07 DIAGNOSIS — F411 Generalized anxiety disorder: Secondary | ICD-10-CM | POA: Diagnosis not present

## 2022-03-07 DIAGNOSIS — F32A Depression, unspecified: Secondary | ICD-10-CM | POA: Diagnosis not present

## 2022-03-07 DIAGNOSIS — J309 Allergic rhinitis, unspecified: Secondary | ICD-10-CM | POA: Diagnosis not present

## 2022-03-07 DIAGNOSIS — J189 Pneumonia, unspecified organism: Secondary | ICD-10-CM | POA: Diagnosis not present

## 2022-03-07 DIAGNOSIS — D631 Anemia in chronic kidney disease: Secondary | ICD-10-CM | POA: Diagnosis not present

## 2022-03-07 DIAGNOSIS — N184 Chronic kidney disease, stage 4 (severe): Secondary | ICD-10-CM | POA: Diagnosis not present

## 2022-03-07 DIAGNOSIS — J4521 Mild intermittent asthma with (acute) exacerbation: Secondary | ICD-10-CM | POA: Diagnosis not present

## 2022-03-07 DIAGNOSIS — B0229 Other postherpetic nervous system involvement: Secondary | ICD-10-CM | POA: Diagnosis not present

## 2022-03-07 DIAGNOSIS — E11319 Type 2 diabetes mellitus with unspecified diabetic retinopathy without macular edema: Secondary | ICD-10-CM | POA: Diagnosis not present

## 2022-03-07 DIAGNOSIS — I700 Atherosclerosis of aorta: Secondary | ICD-10-CM | POA: Diagnosis not present

## 2022-03-07 DIAGNOSIS — N3946 Mixed incontinence: Secondary | ICD-10-CM | POA: Diagnosis not present

## 2022-03-07 DIAGNOSIS — G8929 Other chronic pain: Secondary | ICD-10-CM | POA: Diagnosis not present

## 2022-03-07 DIAGNOSIS — I69354 Hemiplegia and hemiparesis following cerebral infarction affecting left non-dominant side: Secondary | ICD-10-CM | POA: Diagnosis not present

## 2022-03-07 DIAGNOSIS — R001 Bradycardia, unspecified: Secondary | ICD-10-CM | POA: Diagnosis not present

## 2022-03-07 DIAGNOSIS — E538 Deficiency of other specified B group vitamins: Secondary | ICD-10-CM | POA: Diagnosis not present

## 2022-03-07 DIAGNOSIS — J9601 Acute respiratory failure with hypoxia: Secondary | ICD-10-CM | POA: Diagnosis not present

## 2022-03-07 DIAGNOSIS — I130 Hypertensive heart and chronic kidney disease with heart failure and stage 1 through stage 4 chronic kidney disease, or unspecified chronic kidney disease: Secondary | ICD-10-CM | POA: Diagnosis not present

## 2022-03-07 DIAGNOSIS — E669 Obesity, unspecified: Secondary | ICD-10-CM | POA: Diagnosis not present

## 2022-03-07 DIAGNOSIS — E1122 Type 2 diabetes mellitus with diabetic chronic kidney disease: Secondary | ICD-10-CM | POA: Diagnosis not present

## 2022-03-07 DIAGNOSIS — M549 Dorsalgia, unspecified: Secondary | ICD-10-CM | POA: Diagnosis not present

## 2022-03-07 DIAGNOSIS — N390 Urinary tract infection, site not specified: Secondary | ICD-10-CM | POA: Diagnosis not present

## 2022-03-07 DIAGNOSIS — G4730 Sleep apnea, unspecified: Secondary | ICD-10-CM | POA: Diagnosis not present

## 2022-03-07 DIAGNOSIS — A419 Sepsis, unspecified organism: Secondary | ICD-10-CM | POA: Diagnosis not present

## 2022-03-08 ENCOUNTER — Telehealth: Payer: Self-pay | Admitting: Pulmonary Disease

## 2022-03-08 ENCOUNTER — Telehealth: Payer: Self-pay

## 2022-03-08 NOTE — Telephone Encounter (Signed)
I called [employer] back and spoke with Lesleigh Noe and she talked with [name] about getting the patient in for a Overnight Oxygen test. They got the ONO scheduled for the testing. There is a date of 03/22/22 for the testing. Nothing further needed.

## 2022-03-08 NOTE — Telephone Encounter (Signed)
Pharmacy faxed a refill request for solifenacin (VESICARE) 10 MG tablet [[id number]]    Order Details Dose, Route, Frequency: As Directed  Dispense Quantity: 30 tablet Refills: 1        Sig: TAKE 1 TABLET BY MOUTH EVERY DAY       Start Date: 02/05/22 End Date: --  Written Date: 02/05/22 Expiration Date: 02/05/23  Original Order:  solifenacin (VESICARE) 10 MG tablet

## 2022-03-13 ENCOUNTER — Telehealth: Payer: Self-pay | Admitting: Pulmonary Disease

## 2022-03-15 NOTE — Telephone Encounter (Signed)
Called and spoke to Renee [name]- husband told him that when Renee [name] is available to call the office back in regards to letter from [facility].

## 2022-03-16 ENCOUNTER — Telehealth: Payer: Medicare HMO

## 2022-03-18 DIAGNOSIS — A403 Sepsis due to Streptococcus pneumoniae: Secondary | ICD-10-CM | POA: Diagnosis not present

## 2022-03-19 NOTE — Telephone Encounter (Signed)
Called Renee [name] and advised her to keep an eye on the recall letters. I told her to look at the lot number and everything for her albuterol to determine if its recalled or not. Patient verbalized understanding. Nothing further needed

## 2022-03-21 ENCOUNTER — Other Ambulatory Visit: Payer: Self-pay | Admitting: Family Medicine

## 2022-03-23 ENCOUNTER — Ambulatory Visit: Payer: Self-pay | Admitting: *Deleted

## 2022-03-23 ENCOUNTER — Ambulatory Visit: Payer: Medicare HMO | Admitting: *Deleted

## 2022-03-23 ENCOUNTER — Encounter: Payer: Self-pay | Admitting: Family Medicine

## 2022-03-23 ENCOUNTER — Ambulatory Visit (INDEPENDENT_AMBULATORY_CARE_PROVIDER_SITE_OTHER): Payer: Medicare HMO | Admitting: Family Medicine

## 2022-03-23 VITALS — BP 142/58 | HR 60 | Temp 97.8°F | Ht 63.0 in | Wt 173.0 lb

## 2022-03-23 DIAGNOSIS — E11319 Type 2 diabetes mellitus with unspecified diabetic retinopathy without macular edema: Secondary | ICD-10-CM

## 2022-03-23 DIAGNOSIS — F419 Anxiety disorder, unspecified: Secondary | ICD-10-CM

## 2022-03-23 DIAGNOSIS — R32 Unspecified urinary incontinence: Secondary | ICD-10-CM

## 2022-03-23 DIAGNOSIS — R69 Illness, unspecified: Secondary | ICD-10-CM | POA: Diagnosis not present

## 2022-03-23 DIAGNOSIS — F32A Depression, unspecified: Secondary | ICD-10-CM

## 2022-03-23 MED ORDER — SOLIFENACIN SUCCINATE 10 MG PO TABS: 10.0000 mg | ORAL_TABLET | Freq: Every day | ORAL | 2 refills | Status: DC

## 2022-03-23 NOTE — Progress Notes (Signed)
Subjective:     Patient ID: Renee [name], female    DOB: 08-21-1928, 88 y.o.   MRN: [id number]  Chief Complaint  Patient presents with   Follow-up    F/u to discuss diabetes    HPI Patient is in today for follow up on elevated blood sugars at her previous visit. She was taking steroids prior.  Hgb A1c 7.9% 3 weeks ago.  Caregiver reports patient's blood sugars have returned to the usual 130 range. No more BS in the 200s.   Taking Actos 30 mg daily, Januvia 100 mg daily, glipizide XL 5 mg daily    Stage 4 renal disease and is under the care of nephrologist   Caregiver reports patient seems more depressed x 2 weeks. Patient states she feels ok.  She is taking sertraline 50 mg and mirtazapine 15 mg.   Requests refill of Vesicare for urinary incontinence.  She sees Dr. Pete Glatter now for urologist.   No other concerns today.   Denies fever, chills, dizziness, chest pain, palpitations, shortness of breath, abdominal pain, N/V/D.      Health Maintenance Due  Topic Date Due   OPHTHALMOLOGY EXAM  02/04/2020   MAMMOGRAM  08/02/2021   INFLUENZA VACCINE  03/20/2022    Past Medical History:  Diagnosis Date   Angioedema    possibly from voltaren   Bronchopneumonia 12/11/2016   Degenerative disc disease    Diabetes mellitus    type II   Hyperlipidemia    Hypertension    LVH (left ventricular hypertrophy)    and atrial enlargement by echo in past with nl EF   Nasal pruritis    Osteoarthritis    Osteopenia    Renal insufficiency    Sleep apnea    Stroke (HCC) 05/2010   Small vessel sobcortical (in Point trial) with Dr Pearlean Brownie, residual L hemiparesis   Vitamin B 12 deficiency 04/08    Past Surgical History:  Procedure Laterality Date   ABDOMINAL HYSTERECTOMY     BSO-fibroids   APPENDECTOMY     BACK SURGERY     COLON SURGERY     due to punctured intestines   EYE SURGERY     cataract extraction   IR THORACENTESIS ASP PLEURAL SPACE W/IMG GUIDE  06/07/2020   KNEE  SURGERY     arthroscope   PARS PLANA VITRECTOMY  07/31/2011   Procedure: PARS PLANA VITRECTOMY WITH 25 GAUGE;  Surgeon: Sherrie George, MD;  Location: [facility] OR;  Service: Ophthalmology;  Laterality: Right;  REMOVAL OF SILICONE OIL AND LASER RIGHT EYE   RETINAL DETACHMENT SURGERY  02/18/11   times 2   SPINE SURGERY  08/09   spinal decompression surgery    Family History  Problem Relation Age of Onset   COPD Brother    Cancer Sister        brain tumor with hemmorhage   Heart disease Sister        CAD    Social History   Socioeconomic History   Marital status: Widowed    Spouse name: Not on file   Number of children: Not on file   Years of education: Not on file   Highest education level: Not on file  Occupational History   Not on file  Tobacco Use   Smoking status: Never    Passive exposure: Never   Smokeless tobacco: Never  Vaping Use   Vaping Use: Not on file  Substance and Sexual Activity   Alcohol use: No  Alcohol/week: 0.0 standard drinks of alcohol   Drug use: No   Sexual activity: Never  Other Topics Concern   Not on file  Social History Narrative   Not on file   Social Determinants of Health   Financial Resource Strain: Low Risk  (01/13/2021)   Overall Financial Resource Strain (CARDIA)    Difficulty of Paying Living Expenses: Not hard at all  Food Insecurity: No Food Insecurity (07/10/2021)   Hunger Vital Sign    Worried About Running Out of Food in the Last Year: Never true    Ran Out of Food in the Last Year: Never true  Transportation Needs: No Transportation Needs (07/10/2021)   PRAPARE - Hydrologist (Medical): No    Lack of Transportation (Non-Medical): No  Physical Activity: Inactive (01/13/2021)   Exercise Vital Sign    Days of Exercise per Week: 0 days    Minutes of Exercise per Session: 0 min  Stress: No Stress Concern Present (01/13/2021)   [place]    Feeling of Stress : Not at all  Social Connections: Socially Isolated (01/13/2021)   Social Connection and Isolation Panel [NHANES]    Frequency of Communication with Friends and Family: More than three times a week    Frequency of Social Gatherings with Friends and Family: Once a week    Attends Religious Services: Never    Marine scientist or Organizations: No    Attends Archivist Meetings: Never    Marital Status: Widowed  Intimate Partner Violence: Not At Risk (01/13/2021)   Humiliation, Afraid, Rape, and Kick questionnaire    Fear of Current or Ex-Partner: No    Emotionally Abused: No    Physically Abused: No    Sexually Abused: No    Outpatient Medications Prior to Visit  Medication Sig Dispense Refill   acetaminophen (TYLENOL) 500 MG tablet Take 1,000 mg by mouth at bedtime.     albuterol (PROVENTIL) (2.5 MG/3ML) 0.083% nebulizer solution Take 3 mLs (2.5 mg total) by nebulization every 6 (six) hours as needed for wheezing or shortness of breath. 75 mL 5   albuterol (VENTOLIN HFA) 108 (90 Base) MCG/ACT inhaler Inhale 2 puffs into the lungs in the morning, at noon, and at bedtime. 6.7 g 1   amLODipine (NORVASC) 10 MG tablet TAKE 1 TABLET BY MOUTH EVERY DAY (Patient taking differently: Take 10 mg by mouth daily.) 90 tablet 3   aspirin EC 325 MG tablet Take 325 mg by mouth in the morning.     budesonide-formoterol (SYMBICORT) 160-4.5 MCG/ACT inhaler Inhale 2 puffs into the lungs 2 (two) times daily. 10.2 g 1   calcium-vitamin D (OSCAL WITH D) 500-200 MG-UNIT per tablet Take 1 tablet by mouth daily.     Cholecalciferol (VITAMIN D-3) 25 MCG (1000 UT) CAPS Take 1,000 Units by mouth in the morning.     Cranberry 500 MG TABS Take 500 mg by mouth in the morning and at bedtime.     CRANBERRY PO Take by mouth.     docusate sodium (COLACE) 100 MG capsule Take 200 mg by mouth at bedtime.     estradiol (ESTRACE) 0.1 MG/GM vaginal cream Place 1 g vaginally daily.      FLONASE 50 MCG/ACT nasal spray Place 2 sprays into both nostrils See admin instructions. Instill 2 sprays into each nostril at bedtime     furosemide (LASIX) 20 MG tablet Take 1  tablet (20 mg total) by mouth daily. 30 tablet 1   glipiZIDE (GLUCOTROL XL) 5 MG 24 hr tablet TAKE 1 TABLET BY MOUTH EVERY DAY WITH BREAKFAST (Patient taking differently: Take 5 mg by mouth daily with breakfast.) 90 tablet 3   glucose blood (ONETOUCH ULTRA) test strip CHECK BLOOD SUGAR ONCE DAILY AND AS DIRECTED. 100 strip 11   hydrALAZINE (APRESOLINE) 25 MG tablet Take 1 tablet (25 mg total) by mouth every 8 (eight) hours. 90 tablet 1   hydrochlorothiazide (MICROZIDE) 12.5 MG capsule TAKE 1 CAPSULE BY MOUTH EVERY DAY (Patient taking differently: Take 12.5 mg by mouth daily.) 90 capsule 1   JANUVIA 100 MG tablet TAKE 1 TABLET BY MOUTH EVERY DAY 30 tablet 2   Lancets (ONETOUCH DELICA PLUS [id number]) MISC CHECK BLOOD SUGAR ONCE DAILY AND AS DIRECTED 100 each 2   loratadine (CLARITIN) 10 MG tablet Take 1 tablet (10 mg total) by mouth daily. 30 tablet 1   metoprolol succinate (TOPROL-XL) 25 MG 24 hr tablet Take 12.5 mg by mouth 2 (two) times daily.     Multiple Vitamin (MULTIVITAMIN) capsule Take 1 capsule by mouth daily.     Multiple Vitamins-Minerals (PRESERVISION AREDS 2+MULTI VIT) CAPS Take 1 capsule by mouth in the morning and at bedtime.     Olopatadine HCl (PATADAY OP) Place 1 drop into both eyes 2 (two) times daily as needed (for itching or redness).     pantoprazole (PROTONIX) 40 MG tablet Take 1 tablet (40 mg total) by mouth daily at 6 (six) AM. 30 tablet 1   pioglitazone (ACTOS) 30 MG tablet Take 1 tablet (30 mg total) by mouth daily. 90 tablet 3   sertraline (ZOLOFT) 50 MG tablet Take 1 tablet (50 mg total) by mouth daily. 90 tablet 1   Spacer/Aero-Holding Chambers DEVI 1 each by Does not apply route at bedtime. Use with inhaler. 1 each 0   trandolapril (MAVIK) 4 MG tablet TAKE 1 TABLET DAILY (Patient taking  differently: Take 4 mg by mouth daily.) 90 tablet 3   triamcinolone (NASACORT) 55 MCG/ACT AERO nasal inhaler Place 2 sprays into the nose daily. (Patient taking differently: Place 2 sprays into the nose See admin instructions. Instill 2 sprays into each nostril at bedtime when not using Flonase) 1 each 5   vitamin B-12 (CYANOCOBALAMIN) 1000 MCG tablet Take 1,000 mcg by mouth daily.      mirtazapine (REMERON) 15 MG tablet Take 1 tablet (15 mg total) by mouth at bedtime. 90 tablet 3   solifenacin (VESICARE) 10 MG tablet TAKE 1 TABLET BY MOUTH EVERY DAY 30 tablet 1   Blood Glucose Monitoring Suppl (ONE TOUCH ULTRA 2) w/Device KIT Check blood sugar once daily and as directed. Dx E11.9 (Patient not taking: Reported on 02/28/2022) 1 each 0   Continuous Blood Gluc Receiver (FREESTYLE LIBRE 2 READER) DEVI 1 each by Does not apply route as directed. 1 each 1   Continuous Blood Gluc Sensor (FREESTYLE LIBRE 2 SENSOR) MISC 1 each by Does not apply route as directed. 1 each 5   No facility-administered medications prior to visit.    Allergies  Allergen Reactions   Tape Other (See Comments)    The patient's skin is VERY THIN- will TEAR EASILY!!   Voltaren [Diclofenac] Swelling and Other (See Comments)    Angioedema    ROS     Objective:    Physical Exam Constitutional:      General: She is not in acute distress.  Appearance: She is not ill-appearing.  Cardiovascular:     Rate and Rhythm: Normal rate.  Pulmonary:     Effort: Pulmonary effort is normal.  Neurological:     Mental Status: She is alert. Mental status is at baseline.  Psychiatric:        Mood and Affect: Mood normal.        Behavior: Behavior normal.     BP (!) 142/58 (BP Location: Left Arm, Patient Position: Sitting, Cuff Size: Large)   Pulse 60   Temp 97.8 F (36.6 C) (Temporal)   Ht 5' 3" (1.6 m)   Wt 173 lb (78.5 kg)   SpO2 96%   BMI 30.65 kg/m  Wt Readings from Last 3 Encounters:  03/23/22 173 lb (78.5 kg)   02/28/22 173 lb 4 oz (78.6 kg)  02/09/22 173 lb (78.5 kg)       Assessment & Plan:   Problem List Items Addressed This Visit       Endocrine   Type 2 diabetes, controlled, with retinopathy ([place]) - Primary    Caregiver reports blood sugars are improving and now in the 130 range as they were previously.  Most likely she had elevated blood sugars due to oral steroids.  She will continue on Actos, glipizide and Januvia.  Continue checking blood sugars at home.  Follow-up in 3 months or sooner if needed for diabetes.        Other   Anxiety and depression    Currently taking sertraline and mirtazapine.  Follow-up if mood worsens.  She is no longer taking benzodiazepine.      Urinary incontinence    Vesicare is working well.  Refilled medication.  She is followed by Dr. Felipa Eth      Relevant Medications   solifenacin (VESICARE) 10 MG tablet    I have discontinued Renee [name]'s ONE TOUCH ULTRA 2, FreeStyle Libre 2 Sensor, and [employer] 2 Reader. I have also changed her solifenacin. Additionally, I am having her maintain her multivitamin, calcium-vitamin D, docusate sodium, cyanocobalamin, Olopatadine HCl (PATADAY OP), triamcinolone, PreserVision AREDS 2+Multi Vit, Cranberry, acetaminophen, aspirin EC, Vitamin D-3, Flonase, estradiol, OneTouch Delica Plus [id number], pioglitazone, metoprolol succinate, OneTouch Ultra, trandolapril, amLODipine, hydrochlorothiazide, glipiZIDE, sertraline, furosemide, hydrALAZINE, pantoprazole, loratadine, budesonide-formoterol, albuterol, Januvia, albuterol, Spacer/Aero-Holding Chambers, and CRANBERRY PO.  Meds ordered this encounter  Medications   solifenacin (VESICARE) 10 MG tablet    Sig: Take 1 tablet (10 mg total) by mouth daily.    Dispense:  30 tablet    Refill:  2    Order Specific Question:   Supervising Provider    Answer:   Pricilla Holm A [[id number]]

## 2022-03-23 NOTE — Chronic Care Management (AMB) (Signed)
   03/23/2022  [name] [birth year] [id number]    Care Management   Follow Up Note   03/23/2022 Name: Renee [name] MRN: [id number] DOB: 1928-10-16   Referred by: Girtha Rm, NP-C Reason for referral : Care Coordination   An unsuccessful telephone outreach was attempted today. The patient was referred to the case management team for assistance with care management and care coordination.   Follow Up Plan: Telephone follow up appointment with care management team member scheduled for: upon care guide rescheduling. Care plan updated and resolved, pt does not have order after 12/18/21 and has switched to another provider practice, if pt wants to continue services (unable to reach today) , she can be rescheduled with care coordination at that practice.  Jacqlyn Larsen RNC, BSN RN Case Manager [facility] [phone]

## 2022-03-23 NOTE — Chronic Care Management (AMB) (Signed)
   03/23/2022  Renee [name] [birth year] [id number]    Care Management   Follow Up Note   03/23/2022 Name: Renee [name] MRN: [id number] DOB: 12-12-1928   Referred by: Girtha Rm, NP-C Reason for referral : Chronic Care Management (DM2, HTN)   An unsuccessful telephone outreach was attempted today. The patient was referred to the case management team for assistance with care management and care coordination.   Follow Up Plan: Telephone follow up appointment with care management team member scheduled for: upon care guide rescheduling, noted pt has new primary care provider and to be rescheduled with nurse at that provider office.  Jacqlyn Larsen RNC, BSN RN Case Manager [facility] [phone]

## 2022-03-23 NOTE — Assessment & Plan Note (Signed)
Vesicare is working well.  Refilled medication.  She is followed by Dr. Felipa Eth

## 2022-03-23 NOTE — Assessment & Plan Note (Signed)
Currently taking sertraline and mirtazapine.  Follow-up if mood worsens.  She is no longer taking benzodiazepine.

## 2022-03-23 NOTE — Assessment & Plan Note (Signed)
Caregiver reports blood sugars are improving and now in the 130 range as they were previously.  Most likely she had elevated blood sugars due to oral steroids.  She will continue on Actos, glipizide and Januvia.  Continue checking blood sugars at home.  Follow-up in 3 months or sooner if needed for diabetes.

## 2022-03-27 ENCOUNTER — Ambulatory Visit (HOSPITAL_COMMUNITY): Payer: Medicare HMO | Attending: Family Medicine | Admitting: Physical Therapy

## 2022-03-27 ENCOUNTER — Encounter (HOSPITAL_COMMUNITY): Payer: Self-pay | Admitting: Physical Therapy

## 2022-03-27 ENCOUNTER — Telehealth: Payer: Self-pay | Admitting: Pulmonary Disease

## 2022-03-27 DIAGNOSIS — Z7409 Other reduced mobility: Secondary | ICD-10-CM | POA: Diagnosis not present

## 2022-03-27 DIAGNOSIS — R5381 Other malaise: Secondary | ICD-10-CM | POA: Diagnosis not present

## 2022-03-27 DIAGNOSIS — I5032 Chronic diastolic (congestive) heart failure: Secondary | ICD-10-CM | POA: Diagnosis not present

## 2022-03-27 DIAGNOSIS — R2689 Other abnormalities of gait and mobility: Secondary | ICD-10-CM | POA: Diagnosis not present

## 2022-03-27 DIAGNOSIS — M6281 Muscle weakness (generalized): Secondary | ICD-10-CM | POA: Diagnosis not present

## 2022-03-27 DIAGNOSIS — R531 Weakness: Secondary | ICD-10-CM | POA: Insufficient documentation

## 2022-03-27 DIAGNOSIS — Z8673 Personal history of transient ischemic attack (TIA), and cerebral infarction without residual deficits: Secondary | ICD-10-CM | POA: Diagnosis not present

## 2022-03-27 NOTE — Therapy (Signed)
OUTPATIENT PHYSICAL THERAPY NEURO EVALUATION   Patient Name: Renee [name] MRN: [id number] DOB:Sep 19, 1928, 88 y.o., female Today's Date: 03/27/2022   PCP: Harland Dingwall NP REFERRING PROVIDER: Girtha Rm, NP-C    PT End of Session - 03/27/22 1543     Visit Number 1    Number of Visits 16    Date for PT Re-Evaluation 05/22/22    Authorization Type AETNA Medicare    Progress Note Due on Visit 10    PT Start Time 1505    PT Stop Time 1545    PT Time Calculation (min) 40 min    Activity Tolerance Patient tolerated treatment well;Patient limited by fatigue    Behavior During Therapy [facility] for tasks assessed/performed             Past Medical History:  Diagnosis Date   Angioedema    possibly from voltaren   Bronchopneumonia 12/11/2016   Degenerative disc disease    Diabetes mellitus    type II   Hyperlipidemia    Hypertension    LVH (left ventricular hypertrophy)    and atrial enlargement by echo in past with nl EF   Nasal pruritis    Osteoarthritis    Osteopenia    Renal insufficiency    Sleep apnea    Stroke ([place]) 05/2010   Small vessel sobcortical (in Point trial) with Dr Leonie Man, residual L hemiparesis   Vitamin B 12 deficiency 04/08   Past Surgical History:  Procedure Laterality Date   ABDOMINAL HYSTERECTOMY     BSO-fibroids   APPENDECTOMY     BACK SURGERY     COLON SURGERY     due to punctured intestines   EYE SURGERY     cataract extraction   IR THORACENTESIS ASP PLEURAL SPACE W/IMG GUIDE  06/07/2020   KNEE SURGERY     arthroscope   PARS PLANA VITRECTOMY  07/31/2011   Procedure: PARS PLANA VITRECTOMY WITH 25 GAUGE;  Surgeon: [name], MD;  Location: [facility];  Service: Ophthalmology;  Laterality: Right;  REMOVAL OF SILICONE OIL AND LASER RIGHT EYE   RETINAL DETACHMENT SURGERY  02/18/11   times 2   SPINE SURGERY  08/09   spinal decompression surgery   Patient Active Problem List   Diagnosis Date Noted   History of UTI 02/28/2022   Physical  deconditioning 01/19/2022   Chronic respiratory failure with hypoxia ([place]) 01/19/2022   Asthma, chronic, unspecified asthma severity, with acute exacerbation 01/06/2022   Allergic rhinitis 01/06/2022   Chronic diastolic CHF (congestive heart failure) ([place]) 01/06/2022   Obesity (BMI 30-39.9) 01/06/2022   Acute respiratory failure with hypoxia ([place]) 01/05/2022   Bradycardia 09/01/2021   UTI (urinary tract infection) 08/29/2021   Wheezing on expiration 08/29/2021   Sepsis due to pneumonia ([place]) 06/07/2020   Pleural effusion 05/13/2020   Frequent UTI 05/13/2020   Dizziness 02/12/2020   Normocytic anemia 02/12/2020   Fall involving sidewalk curb 11/29/2019   Contusion of back 11/27/2019   Aortic atherosclerosis ([place]) 07/13/2019   CKD (chronic kidney disease) stage 4, GFR 15-29 ml/min ([place]) 07/01/2019   External hemorrhoid 01/17/2018   History of colitis 01/17/2018   Generalized weakness 12/24/2016   Diabetic retinopathy ([place]) 12/11/2016   History of CVA (cerebrovascular accident) 12/11/2016   Weakness secondary to UTI 10/21/2015   Acute renal failure superimposed on stage 3b chronic kidney disease ([place]) 10/12/2015   CAP (community acquired pneumonia) 10/07/2015   Estrogen deficiency 08/30/2015   Mobility impaired 06/26/2011  History of retinal detachment 01/10/2011   Sleep apnea 11/28/2010   Anxiety and depression 08/25/2010   Hemiplegia, late effect of cerebrovascular disease ([place]) 07/05/2010   POSTHERPETIC NEURALGIA 11/09/2009   Renal insufficiency 06/29/2008   Chronic back pain 01/26/2008   EDEMA 01/26/2008   B12 deficiency 01/10/2007   Type 2 diabetes, controlled, with retinopathy ([place]) 11/27/2006   Essential hypertension 11/27/2006   FIBROCYSTIC BREAST DISEASE 11/27/2006   ROSACEA 11/27/2006   OSTEOARTHRITIS 11/27/2006   Urinary incontinence 11/27/2006    ONSET DATE: Chronic   REFERRING DIAG: Z86.73 (ICD-10-CM) - History of CVA (cerebrovascular accident) R53.1  (ICD-10-CM) - Generalized weakness R53.81 (ICD-10-CM) - Physical deconditioning Z74.09 (ICD-10-CM) - Mobility impaired I50.32 (ICD-10-CM) - Chronic diastolic CHF (congestive heart failure) (HCC)   THERAPY DIAG:  Muscle weakness (generalized)  Other abnormalities of gait and mobility  Rationale for Evaluation and Treatment Rehabilitation  SUBJECTIVE:                                                                                                                                                                                              SUBJECTIVE STATEMENT: Patient presents to therapy with complaint of weakness. She states she had a pneumonia in May and was hospitalized. She had some home health therapy following but would like to do some more aggressive therapy for strength and balance. She does walk some with a walker. She has had therapy in the past for LT sided weakness related to history of CVA.   Pt accompanied by: friend [name], primary care giver)   PERTINENT HISTORY: CVA  PAIN:  Are you having pain? No  PRECAUTIONS: Fall  WEIGHT BEARING RESTRICTIONS No  FALLS: Has patient fallen in last 6 months? No  LIVING ENVIRONMENT: Lives with: lives with an adult companion Lives in: House/apartment Stairs: No (ramp) Has following equipment at home: Environmental consultant - 2 wheeled, Wheelchair (manual), shower chair, and Ramped entry  PLOF: Needs assistance with ADLs  PATIENT GOALS  Being able to stand on my own, be able to walk from living room to kitchen.   OBJECTIVE:   DIAGNOSTIC FINDINGS: NA  COGNITION: Overall cognitive status: History of cognitive impairments - at baseline    LOWER EXTREMITY MMT:    MMT Right Eval Left Eval  Hip flexion 4+ 4  Hip extension 5 4+  Hip abduction    Hip adduction    Hip internal rotation    Hip external rotation    Knee flexion    Knee extension 5 4+  Ankle dorsiflexion    Ankle plantarflexion    Ankle inversion    Ankle eversion     (  Blank rows = not tested)  BED MOBILITY:  Sit to supine Modified independence Supine to sit Modified independence Rolling to Right Modified independence Rolling to Left Modified independence  TRANSFERS: Assistive device utilized: Environmental consultant - 2 wheeled  Sit to stand: Modified independence Stand to sit: Modified independence Chair to chair: Modified independence   GAIT: Gait pattern:  decrease stride, flexed trunk, LT knee flexed Distance walked: 170 feet  Assistive device utilized: Walker - 2 wheeled Level of assistance: Modified independence Comments:   FUNCTIONAL TESTs:  2 minute walk test: 90 feet with RW   TODAY'S TREATMENT:  Eval MMT 2MWT  Seated march Laq   PATIENT EDUCATION: Education details: on Eval findings, POC and HEP  Person educated: Patient and Therapist, music Education method: Explanation Education comprehension: verbalized understanding   HOME EXERCISE PROGRAM: Access Code: [email or web address] URL: [email or web address] Date: 03/27/2022 Prepared by: Josue Hector  Exercises - Seated March  - 3 x daily - 7 x weekly - 2 sets - 10 reps - Seated Long Arc Quad  - 3 x daily - 7 x weekly - 2 sets - 10 reps    GOALS: SHORT TERM GOALS: Target date: 04/17/2022  Patient will be independent with initial HEP and self-management strategies to improve functional outcomes Baseline:  Goal status: INITIAL   LONG TERM GOALS: Target date: 05/22/2022  Patient will be independent with advanced HEP and self-management strategies to improve functional outcomes Baseline:  Goal status: INITIAL  2.  Patient will report at least 65% overall improvement in subjective complaint to indicate improvement in ability to perform ADLs. Baseline:  Goal status: INITIAL  3.  Patient will be able to perform stand x 5 in < 30 seconds to demonstrate improvement in functional mobility and reduced risk for falls.  Baseline: 1 rep in 30 sec time  Goal status:  INITIAL  4. Patient will have equal to or 5/5 MMT throughout LLE to improve ability to perform functional mobility, stair ambulation and ADLs.  Baseline: See MMT Goal status: INITIAL  5. Patient will be able to ambulate at least 200 feet during 2MWT with LRAD to demonstrate improved ability to perform functional mobility and associated tasks. Baseline: 90 feet with RW Goal status: INITIAL  ASSESSMENT:  CLINICAL IMPRESSION: Patient is a 88 y.o. female who presents to physical therapy with complaint of weakness. Patient demonstrates muscle weakness, reduced activity tolerance, balance deficits and gait abnormalities which are likely contributing to symptoms of pain and are negatively impacting patient ability to perform ADLs and functional mobility tasks. Patient will benefit from skilled physical therapy services to address these deficits to reduce pain and improve level of function with ADLs and functional mobility tasks.   OBJECTIVE IMPAIRMENTS Abnormal gait, decreased activity tolerance, decreased balance, decreased endurance, decreased mobility, difficulty walking, decreased strength, and improper body mechanics.   ACTIVITY LIMITATIONS standing, transfers, and locomotion level  PARTICIPATION LIMITATIONS: community activity  PERSONAL FACTORS Age, Past/current experiences, and Time since onset of injury/illness/exacerbation are also affecting patient's functional outcome.   REHAB POTENTIAL: Good  CLINICAL DECISION MAKING: Stable/uncomplicated  EVALUATION COMPLEXITY: Low  PLAN: PT FREQUENCY: 2x/week  PT DURATION: 8 weeks  PLANNED INTERVENTIONS: Therapeutic exercises, Therapeutic activity, Neuromuscular re-education, Balance training, Gait training, Patient/Family education, Joint manipulation, Joint mobilization, Stair training, Aquatic Therapy, Dry Needling, Electrical stimulation, Spinal manipulation, Spinal mobilization, Cryotherapy, Moist heat, scar mobilization, Taping,  Traction, Ultrasound, Biofeedback, Ionotophoresis '4mg'$ /ml Dexamethasone, and Manual therapy.   PLAN FOR NEXT SESSION: Progress LE and postural strength (seated>standing) then  balance and gait as tolerated   3:48 PM, 03/27/22 Josue Hector PT DPT  Physical Therapist with [facility]  [phone]

## 2022-03-28 NOTE — Telephone Encounter (Signed)
Attempted to call pt's caregiver [name] but unable to reach. Left message for her to return call.

## 2022-03-29 NOTE — Telephone Encounter (Signed)
Called and spoke with [name]. She verbalized understanding of recommendations. Per [name] the patient has been having a hard time with keep her nasal cannula on. When [name] is getting the patient ready for bed she makes sure the nasal cannula is on. When the patient wakes up, the nasal cannula is off. She does not think using a mask will help.   She wanted to know if Dr. Halford Chessman had any recommendations on keeping the nasal cannula on during the night.

## 2022-03-29 NOTE — Telephone Encounter (Signed)
ONO with RA 03/23/22 >> test time 13 hrs 2 min.  Basal SpO2 91.3%, low SpO2 71%.  Spent 51 minutes with SpO2 < 88%.   Please let her know her oxygen level is still low at night. She needs to continue using supplemental oxygen at night while asleep.

## 2022-03-29 NOTE — Telephone Encounter (Signed)
They can try buying medical tape and tape then nasal cannula to her cheeks.

## 2022-03-29 NOTE — Telephone Encounter (Signed)
Called [name] but call went straight to VM. I found the ONO results in Dr. Juanetta Gosling folder in [place]. Will fax to [facility] for him to review.   Dr. Halford Chessman, can you please advise once you receive the ONO results? Thanks!

## 2022-03-29 NOTE — Telephone Encounter (Signed)
Called and spoke with Renee [name] letting her know recs per VS and she verbalized understanding. Nothing further needed.

## 2022-04-06 ENCOUNTER — Encounter: Payer: Self-pay | Admitting: Urology

## 2022-04-06 ENCOUNTER — Telehealth: Payer: Self-pay | Admitting: Family Medicine

## 2022-04-06 ENCOUNTER — Ambulatory Visit: Payer: Medicare HMO | Admitting: Urology

## 2022-04-06 ENCOUNTER — Telehealth: Payer: Self-pay | Admitting: Pulmonary Disease

## 2022-04-06 VITALS — BP 154/75 | HR 60

## 2022-04-06 DIAGNOSIS — R32 Unspecified urinary incontinence: Secondary | ICD-10-CM

## 2022-04-06 DIAGNOSIS — Z87440 Personal history of urinary (tract) infections: Secondary | ICD-10-CM

## 2022-04-06 DIAGNOSIS — R8290 Unspecified abnormal findings in urine: Secondary | ICD-10-CM

## 2022-04-06 LAB — MICROSCOPIC EXAMINATION: WBC, UA: >30 /hpf — AB (ref 0–5)

## 2022-04-06 LAB — URINALYSIS, ROUTINE W REFLEX MICROSCOPIC
Bilirubin, UA: NEGATIVE
Glucose, UA: NEGATIVE
Nitrite, UA: POSITIVE — AB
RBC, UA: NEGATIVE
Specific Gravity, UA: 1.02 (ref 1.005–1.030)
Urobilinogen, Ur: 0.2 mg/dL (ref 0.2–1.0)
pH, UA: 5.5 (ref 5.0–7.5)

## 2022-04-06 MED ORDER — AZITHROMYCIN 250 MG PO TABS: 250.0000 mg | ORAL_TABLET | Freq: Every day | ORAL | 0 refills | Status: DC

## 2022-04-06 NOTE — Telephone Encounter (Signed)
Called caregiver for further information. Caregiver states that pt for the past couple days has been coughing a lot and coughing up yellow sputum. She reports she saw urology and asked for Dr. Felipa Eth to listen to her lungs and he said that they sounded okay and could be more upper and nasal. Caregiver has been using mucinex and tylenol and is wondering if there is anything else she should be giving her? Offered an appt and she declined.

## 2022-04-06 NOTE — Progress Notes (Signed)
Being   Assessment: 1. History of UTI   2. Urinary incontinence, unspecified type    Plan: Urine culture sent today.  We will contact her with results. Continue methods to reduce the risk of UTIs discussed including increased fluid intake, timed and double voiding, daily cranberry supplement, and daily probiotics. Continue solifenacin 10 mg daily Return to office in 6 weeks  Chief Complaint:  Chief Complaint  Patient presents with   Recurrent UTI    History of Present Illness:  Renee [name] is a 88 y.o. year old female who is seen for further evaluation of frequent UTIs.  She has a history of frequent urinary tract infections.  She does not have typical UTI symptoms.  Her UTIs typically present with generalized malaise and confusion.  No dysuria or gross hematuria.  No flank pain, fever, or chills.  Urine culture results: 1/22 mixed flora 2/22 Klebsiella 3/22 <10K colonies 4/22 Citrobacter 5/22 no growth 7/22 multiple species 9/22 E. coli  She has been on daily cephalexin for >1-year for UTI prevention. She was not having any UTI symptoms at her visit in July 2023. She has urinary incontinence.  She has been managed with Vesicare 10 mg daily.  Her daytime symptoms are fairly well controlled.  She does have incontinence at night.  She has previously seen Dr. Abner Greenspan at [facility].  The daily cephalexin was discontinued in July 2023.  She returns today for follow-up.  She has been off the cephalexin for 1 month.  She is not having any current UTI symptoms.  No dysuria or gross hematuria.  She has noted a strong odor to her urine recently.  No fevers or chills.  She continues on Vesicare 10 mg daily.  Her incontinence symptoms are unchanged.  Portions of the above documentation were copied from a prior visit for review purposes only.   Past Medical History:  Past Medical History:  Diagnosis Date   Angioedema    possibly from voltaren   Bronchopneumonia 12/11/2016    Degenerative disc disease    Diabetes mellitus    type II   Hyperlipidemia    Hypertension    LVH (left ventricular hypertrophy)    and atrial enlargement by echo in past with nl EF   Nasal pruritis    Osteoarthritis    Osteopenia    Renal insufficiency    Sleep apnea    Stroke ([place]) 05/2010   Small vessel sobcortical (in Point trial) with Dr Leonie Man, residual L hemiparesis   Vitamin B 12 deficiency 04/08    Past Surgical History:  Past Surgical History:  Procedure Laterality Date   ABDOMINAL HYSTERECTOMY     BSO-fibroids   APPENDECTOMY     BACK SURGERY     COLON SURGERY     due to punctured intestines   EYE SURGERY     cataract extraction   IR THORACENTESIS ASP PLEURAL SPACE W/IMG GUIDE  06/07/2020   KNEE SURGERY     arthroscope   PARS PLANA VITRECTOMY  07/31/2011   Procedure: PARS PLANA VITRECTOMY WITH 25 GAUGE;  Surgeon: [name], MD;  Location: [place];  Service: Ophthalmology;  Laterality: Right;  REMOVAL OF SILICONE OIL AND LASER RIGHT EYE   RETINAL DETACHMENT SURGERY  02/18/11   times 2   SPINE SURGERY  08/09   spinal decompression surgery    Allergies:  Allergies  Allergen Reactions   Tape Other (See Comments)    The patient's skin is VERY THIN- will  TEAR EASILY!!   Voltaren [Diclofenac] Swelling and Other (See Comments)    Angioedema    Family History:  Family History  Problem Relation Age of Onset   COPD Brother    Cancer Sister        brain tumor with hemmorhage   Heart disease Sister        CAD    Social History:  Social History   Tobacco Use   Smoking status: Never    Passive exposure: Never   Smokeless tobacco: Never  Substance Use Topics   Alcohol use: No    Alcohol/week: 0.0 standard drinks of alcohol   Drug use: No    ROS: Constitutional:  Negative for fever, chills, weight loss CV: Negative for chest pain, previous MI, hypertension Respiratory:  Negative for shortness of breath, wheezing, sleep apnea, frequent cough GI:   Negative for nausea, vomiting, bloody stool, GERD  Physical exam: BP (!) 154/75   Pulse 60  GENERAL APPEARANCE:  Well appearing, well developed, well nourished, NAD HEENT:  Atraumatic, normocephalic, oropharynx clear NECK:  Supple without lymphadenopathy or thyromegaly ABDOMEN:  Soft, non-tender, no masses EXTREMITIES:  Moves all extremities well, without clubbing, cyanosis, or edema NEUROLOGIC:  Alert and oriented x 3, in wheelchair, CN II-XII grossly intact MENTAL STATUS:  appropriate BACK:  Non-tender to palpation, No CVAT SKIN:  Warm, dry, and intact  Results: U/A: >30 WBCs, 0-2 RBCs, many bacteria, nitrite positive

## 2022-04-06 NOTE — Telephone Encounter (Signed)
Patient's sister would like a call - states that patient may need an antibiotic - offered patient appointment (virtual) with Jeralyn Ruths and they declined.

## 2022-04-06 NOTE — Telephone Encounter (Signed)
I advised the her that the pt can continue mucinex and tylenol but will need a visit (or VV) for anymore advise.  She understood and had no further questions

## 2022-04-06 NOTE — Telephone Encounter (Signed)
Called and left detailed message per [facility] request. Told her we were sending in a zpak and if Aryel still was not feeling well after it was completed to call office so we can get her in for an office visit. Continue mucinex as she has been doing. Nothing further needed

## 2022-04-06 NOTE — Telephone Encounter (Signed)
Can call in [place] take as directed #1 , no refills  Continue on Mucinex DM As needed   If not improving will need office visit  Please contact office for sooner follow up if symptoms do not improve or worsen or seek emergency care

## 2022-04-06 NOTE — Telephone Encounter (Signed)
Called [name] back and she states that patient has been coughing up yellow mucus for about 2 days now. [name] states patient has been coughing a lot and blowing her nose. Pt does sound congested. No fever noted. She is taking Mucinex OTC. And she is taking her albuterol nebs with no issues. She is just wanting to know what else she can do.   TP please advise since you have seen this pt in the past. Since Dr Halford Chessman is out.

## 2022-04-06 NOTE — Telephone Encounter (Signed)
After speaking with Renee [name], she has advised that pt can continue mucinex and tylenol but will need a visit (or VV) for anymore advise.  LM for callback.

## 2022-04-10 ENCOUNTER — Telehealth: Payer: Self-pay

## 2022-04-10 LAB — URINE CULTURE

## 2022-04-10 MED ORDER — CEFDINIR 300 MG PO CAPS: 300.0000 mg | ORAL_CAPSULE | Freq: Two times a day (BID) | ORAL | 0 refills | Status: AC

## 2022-04-10 NOTE — Telephone Encounter (Signed)
Left voicemail for patient to return call for results.

## 2022-04-10 NOTE — Telephone Encounter (Signed)
-----   Message from Primus Bravo, MD sent at 04/10/2022  8:53 AM EDT ----- Please notify patient to begin Cefdinir 300 mg BID x 7 days for UTI treatment.

## 2022-04-10 NOTE — Telephone Encounter (Signed)
Caregiver, Granville Lewis aware of results and rx ar pharmacy for patient.

## 2022-04-10 NOTE — Addendum Note (Signed)
Addended by: Primus Bravo on: 04/10/2022 08:53 AM   Modules accepted: Orders

## 2022-04-11 ENCOUNTER — Ambulatory Visit (HOSPITAL_COMMUNITY): Payer: Medicare HMO

## 2022-04-11 DIAGNOSIS — R5381 Other malaise: Secondary | ICD-10-CM | POA: Diagnosis not present

## 2022-04-11 DIAGNOSIS — R2689 Other abnormalities of gait and mobility: Secondary | ICD-10-CM | POA: Diagnosis not present

## 2022-04-11 DIAGNOSIS — Z7409 Other reduced mobility: Secondary | ICD-10-CM | POA: Diagnosis not present

## 2022-04-11 DIAGNOSIS — Z8673 Personal history of transient ischemic attack (TIA), and cerebral infarction without residual deficits: Secondary | ICD-10-CM | POA: Diagnosis not present

## 2022-04-11 DIAGNOSIS — M6281 Muscle weakness (generalized): Secondary | ICD-10-CM

## 2022-04-11 DIAGNOSIS — I5032 Chronic diastolic (congestive) heart failure: Secondary | ICD-10-CM | POA: Diagnosis not present

## 2022-04-11 DIAGNOSIS — R531 Weakness: Secondary | ICD-10-CM | POA: Diagnosis not present

## 2022-04-11 NOTE — Therapy (Signed)
OUTPATIENT PHYSICAL THERAPY NEURO EVALUATION   Patient Name: Renee [name] MRN: [id number] DOB:1929-04-29, 88 y.o., female Today's Date: 04/11/2022   PCP: Harland Dingwall NP REFERRING PROVIDER: Girtha Rm, NP-C    PT End of Session - 04/11/22 1432     Visit Number 2    Number of Visits 16    Date for PT Re-Evaluation 05/22/22    Authorization Type [employer] Medicare    Progress Note Due on Visit 10    PT Start Time 1430    PT Stop Time 1513    PT Time Calculation (min) 43 min    Activity Tolerance Patient tolerated treatment well;Patient limited by fatigue    Behavior During Therapy [facility] for tasks assessed/performed              Past Medical History:  Diagnosis Date   Angioedema    possibly from voltaren   Bronchopneumonia 12/11/2016   Degenerative disc disease    Diabetes mellitus    type II   Hyperlipidemia    Hypertension    LVH (left ventricular hypertrophy)    and atrial enlargement by echo in past with nl EF   Nasal pruritis    Osteoarthritis    Osteopenia    Renal insufficiency    Sleep apnea    Stroke ([place]) 05/2010   Small vessel sobcortical (in Point trial) with Dr Leonie Man, residual L hemiparesis   Vitamin B 12 deficiency 04/08   Past Surgical History:  Procedure Laterality Date   ABDOMINAL HYSTERECTOMY     BSO-fibroids   APPENDECTOMY     BACK SURGERY     COLON SURGERY     due to punctured intestines   EYE SURGERY     cataract extraction   IR THORACENTESIS ASP PLEURAL SPACE W/IMG GUIDE  06/07/2020   KNEE SURGERY     arthroscope   PARS PLANA VITRECTOMY  07/31/2011   Procedure: PARS PLANA VITRECTOMY WITH 25 GAUGE;  Surgeon: [name], MD;  Location: [place];  Service: Ophthalmology;  Laterality: Right;  REMOVAL OF SILICONE OIL AND LASER RIGHT EYE   RETINAL DETACHMENT SURGERY  02/18/11   times 2   SPINE SURGERY  08/09   spinal decompression surgery   Patient Active Problem List   Diagnosis Date Noted   History of UTI 02/28/2022    Physical deconditioning 01/19/2022   Chronic respiratory failure with hypoxia ([place]) 01/19/2022   Asthma, chronic, unspecified asthma severity, with acute exacerbation 01/06/2022   Allergic rhinitis 01/06/2022   Chronic diastolic CHF (congestive heart failure) ([place]) 01/06/2022   Obesity (BMI 30-39.9) 01/06/2022   Acute respiratory failure with hypoxia ([place]) 01/05/2022   Bradycardia 09/01/2021   UTI (urinary tract infection) 08/29/2021   Wheezing on expiration 08/29/2021   Sepsis due to pneumonia ([place]) 06/07/2020   Pleural effusion 05/13/2020   Frequent UTI 05/13/2020   Dizziness 02/12/2020   Normocytic anemia 02/12/2020   Fall involving sidewalk curb 11/29/2019   Contusion of back 11/27/2019   Aortic atherosclerosis ([place]) 07/13/2019   CKD (chronic kidney disease) stage 4, GFR 15-29 ml/min ([place]) 07/01/2019   External hemorrhoid 01/17/2018   History of colitis 01/17/2018   Generalized weakness 12/24/2016   Diabetic retinopathy ([place]) 12/11/2016   History of CVA (cerebrovascular accident) 12/11/2016   Weakness secondary to UTI 10/21/2015   Acute renal failure superimposed on stage 3b chronic kidney disease ([place]) 10/12/2015   CAP (community acquired pneumonia) 10/07/2015   Estrogen deficiency 08/30/2015   Mobility impaired 06/26/2011  History of retinal detachment 01/10/2011   Sleep apnea 11/28/2010   Anxiety and depression 08/25/2010   Hemiplegia, late effect of cerebrovascular disease ([place]) 07/05/2010   POSTHERPETIC NEURALGIA 11/09/2009   Renal insufficiency 06/29/2008   Chronic back pain 01/26/2008   EDEMA 01/26/2008   B12 deficiency 01/10/2007   Type 2 diabetes, controlled, with retinopathy ([place]) 11/27/2006   Essential hypertension 11/27/2006   FIBROCYSTIC BREAST DISEASE 11/27/2006   ROSACEA 11/27/2006   OSTEOARTHRITIS 11/27/2006   Urinary incontinence 11/27/2006    ONSET DATE: Chronic   REFERRING DIAG: Z86.73 (ICD-10-CM) - History of CVA (cerebrovascular accident)  R53.1 (ICD-10-CM) - Generalized weakness R53.81 (ICD-10-CM) - Physical deconditioning Z74.09 (ICD-10-CM) - Mobility impaired I50.32 (ICD-10-CM) - Chronic diastolic CHF (congestive heart failure) (HCC)   THERAPY DIAG:  Muscle weakness (generalized)  Other abnormalities of gait and mobility  Rationale for Evaluation and Treatment Rehabilitation  SUBJECTIVE:                                                                                                                                                                                              SUBJECTIVE STATEMENT: Patient reports she has a cough; taking mucinex. Was able to do some of her HEP. Arrives in transport chair.  Eval: Patient presents to therapy with complaint of weakness. She states she had a pneumonia in May and was hospitalized. She had some home health therapy following but would like to do some more aggressive therapy for strength and balance. She does walk some with a walker. She has had therapy in the past for LT sided weakness related to history of CVA.   Pt accompanied by: friend [name], primary care giver)   PERTINENT HISTORY: CVA  PAIN:  Are you having pain? No  PRECAUTIONS: Fall  WEIGHT BEARING RESTRICTIONS No  FALLS: Has patient fallen in last 6 months? No  LIVING ENVIRONMENT: Lives with: lives with an adult companion Lives in: House/apartment Stairs: No (ramp) Has following equipment at home: Environmental consultant - 2 wheeled, Wheelchair (manual), shower chair, and Ramped entry  PLOF: Needs assistance with ADLs  PATIENT GOALS  Being able to stand on my own, be able to walk from living room to kitchen.   OBJECTIVE:   DIAGNOSTIC FINDINGS: NA  COGNITION: Overall cognitive status: History of cognitive impairments - at baseline    LOWER EXTREMITY MMT:    MMT Right Eval Left Eval  Hip flexion 4+ 4  Hip extension 5 4+  Hip abduction    Hip adduction    Hip internal rotation    Hip external rotation    Knee  flexion  Knee extension 5 4+  Ankle dorsiflexion    Ankle plantarflexion    Ankle inversion    Ankle eversion    (Blank rows = not tested)  BED MOBILITY:  Sit to supine Modified independence Supine to sit Modified independence Rolling to Right Modified independence Rolling to Left Modified independence  TRANSFERS: Assistive device utilized: Environmental consultant - 2 wheeled  Sit to stand: Modified independence Stand to sit: Modified independence Chair to chair: Modified independence   GAIT: Gait pattern:  decrease stride, flexed trunk, LT knee flexed Distance walked: 170 feet  Assistive device utilized: Walker - 2 wheeled Level of assistance: Modified independence Comments:   FUNCTIONAL TESTs:  2 minute walk test: 90 feet with RW   TODAY'S TREATMENT:  04/11/22 Review of HEP and goals Seated: Marching 2 x 10 LAQ's 2 x 10 Heel toe raises x 20  Gait training with RW and CGA x 50 ft and then 100 ft with WC following     Eval MMT 2MWT  Seated march Laq   PATIENT EDUCATION: Education details: on Eval findings, POC and HEP  Person educated: Patient and Therapist, music Education method: Explanation Education comprehension: verbalized understanding   HOME EXERCISE PROGRAM: Access Code: [email or web address] URL: [email or web address] Date: 04/11/2022 Prepared by: AP - Rehab  Exercises - Seated March  - 3 x daily - 7 x weekly - 2 sets - 10 reps - Seated Long Arc Quad  - 3 x daily - 7 x weekly - 2 sets - 10 reps - Seated Heel Toe Raises  - 3 x daily - 7 x weekly - 2 sets - 10 reps - Seated Hip Adduction Squeeze with Ball  - 3 x daily - 7 x weekly - 2 sets - 10 reps - Seated Hip Abduction with Resistance  - 3 x daily - 7 x weekly - 2 sets - 10 reps    GOALS: SHORT TERM GOALS: Target date: 04/17/2022  Patient will be independent with initial HEP and self-management strategies to improve functional outcomes Baseline:  Goal status: met  LONG TERM GOALS: Target  date: 05/22/2022  Patient will be independent with advanced HEP and self-management strategies to improve functional outcomes Baseline:  Goal status:ongoing  2.  Patient will report at least 65% overall improvement in subjective complaint to indicate improvement in ability to perform ADLs. Baseline:  Goal status: ongoing  3.  Patient will be able to perform stand x 5 in < 30 seconds to demonstrate improvement in functional mobility and reduced risk for falls.  Baseline: 1 rep in 30 sec time  Goal status: ongoing  4. Patient will have equal to or 5/5 MMT throughout LLE to improve ability to perform functional mobility, stair ambulation and ADLs.  Baseline: See MMT Goal status: ongoing  5. Patient will be able to ambulate at least 200 feet during 2MWT with LRAD to demonstrate improved ability to perform functional mobility and associated tasks. Baseline: 90 feet with RW Goal status: ongoing  ASSESSMENT:  CLINICAL IMPRESSION: Today's session started with review of HEP and goals. Patient verbalizes agreement with set rehab goals.  Updated HEP.  Gait training with RW; patient needs verbal cues to improve posturing with walking; walks with forward flexed trunk.  Productive cough noted. Patient will benefit from skilled physical therapy services to address these deficits to reduce pain and improve level of function with ADLs and functional mobility tasks.   OBJECTIVE IMPAIRMENTS Abnormal gait, decreased activity tolerance, decreased balance, decreased endurance, decreased mobility, difficulty walking,  decreased strength, and improper body mechanics.   ACTIVITY LIMITATIONS standing, transfers, and locomotion level  PARTICIPATION LIMITATIONS: community activity  PERSONAL FACTORS Age, Past/current experiences, and Time since onset of injury/illness/exacerbation are also affecting patient's functional outcome.   REHAB POTENTIAL: Good  CLINICAL DECISION MAKING:  Stable/uncomplicated  EVALUATION COMPLEXITY: Low  PLAN: PT FREQUENCY: 2x/week  PT DURATION: 8 weeks  PLANNED INTERVENTIONS: Therapeutic exercises, Therapeutic activity, Neuromuscular re-education, Balance training, Gait training, Patient/Family education, Joint manipulation, Joint mobilization, Stair training, Aquatic Therapy, Dry Needling, Electrical stimulation, Spinal manipulation, Spinal mobilization, Cryotherapy, Moist heat, scar mobilization, Taping, Traction, Ultrasound, Biofeedback, Ionotophoresis 23m/ml Dexamethasone, and Manual therapy.   PLAN FOR NEXT SESSION: Progress LE and postural strength (seated>standing) then balance and gait as tolerated   3:19 PM, 04/11/22 Evalin Shawhan Small Kass Herberger MPT [facility] [place] #[phone]

## 2022-04-12 ENCOUNTER — Ambulatory Visit (HOSPITAL_COMMUNITY): Payer: Medicare HMO

## 2022-04-12 ENCOUNTER — Telehealth (HOSPITAL_COMMUNITY): Payer: Self-pay

## 2022-04-12 NOTE — Telephone Encounter (Signed)
Caregiver called to cx due to Renee [name] has a cold and can not see the MD until tomorrow. They plan to be here on Monday as scheduled.

## 2022-04-13 ENCOUNTER — Ambulatory Visit (INDEPENDENT_AMBULATORY_CARE_PROVIDER_SITE_OTHER): Payer: Medicare HMO | Admitting: Family Medicine

## 2022-04-13 ENCOUNTER — Encounter: Payer: Self-pay | Admitting: Family Medicine

## 2022-04-13 ENCOUNTER — Ambulatory Visit (INDEPENDENT_AMBULATORY_CARE_PROVIDER_SITE_OTHER): Payer: Medicare HMO

## 2022-04-13 VITALS — BP 120/60 | HR 61 | Temp 97.8°F | Ht 63.0 in

## 2022-04-13 DIAGNOSIS — R051 Acute cough: Secondary | ICD-10-CM

## 2022-04-13 DIAGNOSIS — R0689 Other abnormalities of breathing: Secondary | ICD-10-CM

## 2022-04-13 DIAGNOSIS — R1310 Dysphagia, unspecified: Secondary | ICD-10-CM | POA: Diagnosis not present

## 2022-04-13 DIAGNOSIS — R059 Cough, unspecified: Secondary | ICD-10-CM | POA: Diagnosis not present

## 2022-04-13 LAB — CBC WITH DIFFERENTIAL/PLATELET
Basophils Absolute: 0.1 10*3/uL (ref 0.0–0.1)
Basophils Relative: 0.9 % (ref 0.0–3.0)
Eosinophils Absolute: 0.2 10*3/uL (ref 0.0–0.7)
Eosinophils Relative: 4.1 % (ref 0.0–5.0)
HCT: 34.9 % — ABNORMAL LOW (ref 36.0–46.0)
Hemoglobin: 11.4 g/dL — ABNORMAL LOW (ref 12.0–15.0)
Lymphocytes Relative: 18.3 % (ref 12.0–46.0)
Lymphs Abs: 1.1 10*3/uL (ref 0.7–4.0)
MCHC: 32.7 g/dL (ref 30.0–36.0)
MCV: 86.1 fl (ref 78.0–100.0)
Monocytes Absolute: 0.4 10*3/uL (ref 0.1–1.0)
Monocytes Relative: 7.7 % (ref 3.0–12.0)
Neutro Abs: 4 10*3/uL (ref 1.4–7.7)
Neutrophils Relative %: 69 % (ref 43.0–77.0)
Platelets: 178 10*3/uL (ref 150.0–400.0)
RBC: 4.05 Mil/uL (ref 3.87–5.11)
RDW: 15 % (ref 11.5–15.5)
WBC: 5.8 10*3/uL (ref 4.0–10.5)

## 2022-04-13 MED ORDER — BENZONATATE 200 MG PO CAPS: 200.0000 mg | ORAL_CAPSULE | Freq: Two times a day (BID) | ORAL | 0 refills | Status: DC | PRN

## 2022-04-13 NOTE — Progress Notes (Signed)
Subjective: Chief Complaint  Patient presents with   Cough    Coughing up yellow and white thick sputum for about 2-3 days. Had Dr listen to her lungs and they said it sounded okay. Has been using OTC mucinex.      Renee [name] is a 88 y.o. female who presents for a cough x 1 week.   Denies fever, chills, chest pain, palpitations, shortness of breath, abdominal pain, nausea, vomiting, diarrhea or LE edema.  States they called her pulmonologist and azithromycin was sent to the pharmacy.  They have not started this medication.  He also reports seeing Dr. Felipa Eth, urologist, and she was prescribed 7 days of cefdinir due to a UTI.  She is currently taking this medication.   No other aggravating or relieving factors.    Her caregiver reports that she seems to be choking while eating. Caregiver also reports patient is not eating fast or taking large bites. Patient denies food getting stuck in her esophagus.  ROS as in subjective.   Objective: Vitals:   04/13/22 1321  BP: 120/60  Pulse: 61  Temp: 97.8 F (36.6 C)  SpO2: 90%    General appearance: Alert, WD/WN, no distress, mildly ill appearing                             Skin: warm, no rash                            Eyes: conjunctiva normal, corneas clear, PERRLA                           Neck: supple, no adenopathy, no thyromegaly, nontender                          Heart: RRR                         Lungs: Decreased lung sounds left lower lobe, CTA otherwise.      Assessment: Acute cough - Plan: DG SWALLOW STUDY OP MEDICARE SPEECH PATH, CBC with Differential/Platelet, DG Chest 2 View, benzonatate (TESSALON) 200 MG capsule, CBC with Differential/Platelet  Decreased breath sounds at left lung base - Plan: CBC with Differential/Platelet, CBC with Differential/Platelet  Dysphagia, unspecified type - Plan: DG SWALLOW STUDY OP MEDICARE SPEECH PATH, CBC with Differential/Platelet, CBC with Differential/Platelet   Plan: Stat  CBC and chest x-ray ordered.  No pneumonia seen on x-ray and normal WBC.   She is currently taking cefdinir for UTI and I recommend that she complete this. Suggested symptomatic OTC remedies. I will order a swallow study for dysphagia.  Discussed taking small bites of food, chewing her food well and avoiding talking while eating.

## 2022-04-13 NOTE — Patient Instructions (Signed)
Please go downstairs for labs and a chest x-ray before you leave.  Continue cefdinir antibiotic now and hold off on the Z-Pak  I prescribed Tessalon Perles for cough.  I have also ordered a swallowing study and they will call you to schedule this. Make sure you are eating small bites, chewing your food well and avoid talking while eating.  I will be in touch with your results

## 2022-04-16 ENCOUNTER — Ambulatory Visit (HOSPITAL_COMMUNITY): Payer: Medicare HMO

## 2022-04-16 ENCOUNTER — Telehealth: Payer: Self-pay

## 2022-04-16 DIAGNOSIS — I5032 Chronic diastolic (congestive) heart failure: Secondary | ICD-10-CM | POA: Diagnosis not present

## 2022-04-16 DIAGNOSIS — R2689 Other abnormalities of gait and mobility: Secondary | ICD-10-CM

## 2022-04-16 DIAGNOSIS — M6281 Muscle weakness (generalized): Secondary | ICD-10-CM

## 2022-04-16 DIAGNOSIS — R531 Weakness: Secondary | ICD-10-CM | POA: Diagnosis not present

## 2022-04-16 DIAGNOSIS — R5381 Other malaise: Secondary | ICD-10-CM | POA: Diagnosis not present

## 2022-04-16 DIAGNOSIS — Z8673 Personal history of transient ischemic attack (TIA), and cerebral infarction without residual deficits: Secondary | ICD-10-CM | POA: Diagnosis not present

## 2022-04-16 DIAGNOSIS — Z7409 Other reduced mobility: Secondary | ICD-10-CM | POA: Diagnosis not present

## 2022-04-16 NOTE — Telephone Encounter (Signed)
Pt caregiver calling back to give PCP the name of the medication that  was prescribed azithromycin (ZITHROMAX) 250 MG tablet from [facility].  The caregiver has not stated to give the pt the medication waiting on the approval from Harland Dingwall, NP  Caregiver cb number [phone] or [phone]

## 2022-04-16 NOTE — Telephone Encounter (Signed)
I called caregiver to let her know that V.Henson, NP approved her to start medication.

## 2022-04-16 NOTE — Therapy (Signed)
OUTPATIENT PHYSICAL THERAPY NEURO EVALUATION   Patient Name: Renee [name] MRN: [id number] DOB:1929-04-03, 88 y.o., female Today's Date: 04/16/2022   PCP: Harland Dingwall NP REFERRING PROVIDER: Girtha Rm, NP-C    PT End of Session - 04/16/22 1349     Visit Number 3    Number of Visits 16    Date for PT Re-Evaluation 05/22/22    Authorization Type [employer] Medicare    Progress Note Due on Visit 10    PT Start Time 1347    PT Stop Time 1428    PT Time Calculation (min) 41 min    Activity Tolerance Patient tolerated treatment well;Patient limited by fatigue    Behavior During Therapy [facility] for tasks assessed/performed               Past Medical History:  Diagnosis Date   Angioedema    possibly from voltaren   Bronchopneumonia 12/11/2016   Degenerative disc disease    Diabetes mellitus    type II   Hyperlipidemia    Hypertension    LVH (left ventricular hypertrophy)    and atrial enlargement by echo in past with nl EF   Nasal pruritis    Osteoarthritis    Osteopenia    Renal insufficiency    Sleep apnea    Stroke ([place]) 05/2010   Small vessel sobcortical (in [place] trial) with Dr Leonie Man, residual L hemiparesis   Vitamin B 12 deficiency 04/08   Past Surgical History:  Procedure Laterality Date   ABDOMINAL HYSTERECTOMY     BSO-fibroids   APPENDECTOMY     BACK SURGERY     COLON SURGERY     due to punctured intestines   EYE SURGERY     cataract extraction   IR THORACENTESIS ASP PLEURAL SPACE W/IMG GUIDE  06/07/2020   KNEE SURGERY     arthroscope   PARS PLANA VITRECTOMY  07/31/2011   Procedure: PARS PLANA VITRECTOMY WITH 25 GAUGE;  Surgeon: [name], MD;  Location: [facility];  Service: Ophthalmology;  Laterality: Right;  REMOVAL OF SILICONE OIL AND LASER RIGHT EYE   RETINAL DETACHMENT SURGERY  02/18/11   times 2   SPINE SURGERY  08/09   spinal decompression surgery   Patient Active Problem List   Diagnosis Date Noted   History of UTI 02/28/2022    Physical deconditioning 01/19/2022   Chronic respiratory failure with hypoxia ([place]) 01/19/2022   Asthma, chronic, unspecified asthma severity, with acute exacerbation 01/06/2022   Allergic rhinitis 01/06/2022   Chronic diastolic CHF (congestive heart failure) ([place]) 01/06/2022   Obesity (BMI 30-39.9) 01/06/2022   Acute respiratory failure with hypoxia ([place]) 01/05/2022   Bradycardia 09/01/2021   UTI (urinary tract infection) 08/29/2021   Wheezing on expiration 08/29/2021   Sepsis due to pneumonia ([place]) 06/07/2020   Pleural effusion 05/13/2020   Frequent UTI 05/13/2020   Dizziness 02/12/2020   Normocytic anemia 02/12/2020   Fall involving sidewalk curb 11/29/2019   Contusion of back 11/27/2019   Aortic atherosclerosis ([place]) 07/13/2019   CKD (chronic kidney disease) stage 4, GFR 15-29 ml/min ([place]) 07/01/2019   External hemorrhoid 01/17/2018   History of colitis 01/17/2018   Generalized weakness 12/24/2016   Diabetic retinopathy ([place]) 12/11/2016   History of CVA (cerebrovascular accident) 12/11/2016   Weakness secondary to UTI 10/21/2015   Acute renal failure superimposed on stage 3b chronic kidney disease ([place]) 10/12/2015   CAP (community acquired pneumonia) 10/07/2015   Estrogen deficiency 08/30/2015   Mobility impaired  06/26/2011   History of retinal detachment 01/10/2011   Sleep apnea 11/28/2010   Anxiety and depression 08/25/2010   Hemiplegia, late effect of cerebrovascular disease ([place]) 07/05/2010   POSTHERPETIC NEURALGIA 11/09/2009   Renal insufficiency 06/29/2008   Chronic back pain 01/26/2008   EDEMA 01/26/2008   B12 deficiency 01/10/2007   Type 2 diabetes, controlled, with retinopathy ([place]) 11/27/2006   Essential hypertension 11/27/2006   FIBROCYSTIC BREAST DISEASE 11/27/2006   ROSACEA 11/27/2006   OSTEOARTHRITIS 11/27/2006   Urinary incontinence 11/27/2006    ONSET DATE: Chronic   REFERRING DIAG: Z86.73 (ICD-10-CM) - History of CVA (cerebrovascular accident)  R53.1 (ICD-10-CM) - Generalized weakness R53.81 (ICD-10-CM) - Physical deconditioning Z74.09 (ICD-10-CM) - Mobility impaired I50.32 (ICD-10-CM) - Chronic diastolic CHF (congestive heart failure) (HCC)   THERAPY DIAG:  Muscle weakness (generalized)  Other abnormalities of gait and mobility  Rationale for Evaluation and Treatment Rehabilitation  SUBJECTIVE:                                                                                                                                                                                              SUBJECTIVE STATEMENT: Patient reports her cough is better; still taking a Mucinex.     Eval: Patient presents to therapy with complaint of weakness. She states she had a pneumonia in May and was hospitalized. She had some home health therapy following but would like to do some more aggressive therapy for strength and balance. She does walk some with a walker. She has had therapy in the past for LT sided weakness related to history of CVA.   Pt accompanied by: friend [name], primary care giver)   PERTINENT HISTORY: CVA  PAIN:  Are you having pain? No  PRECAUTIONS: Fall  WEIGHT BEARING RESTRICTIONS No  FALLS: Has patient fallen in last 6 months? No  LIVING ENVIRONMENT: Lives with: lives with an adult companion Lives in: House/apartment Stairs: No (ramp) Has following equipment at home: Environmental consultant - 2 wheeled, Wheelchair (manual), shower chair, and Ramped entry  PLOF: Needs assistance with ADLs  PATIENT GOALS  Being able to stand on my own, be able to walk from living room to kitchen.   OBJECTIVE:   DIAGNOSTIC FINDINGS: NA  COGNITION: Overall cognitive status: History of cognitive impairments - at baseline    LOWER EXTREMITY MMT:    MMT Right Eval Left Eval  Hip flexion 4+ 4  Hip extension 5 4+  Hip abduction    Hip adduction    Hip internal rotation    Hip external rotation    Knee flexion    Knee extension 5 4+  Ankle  dorsiflexion    Ankle plantarflexion    Ankle inversion    Ankle eversion    (Blank rows = not tested)  BED MOBILITY:  Sit to supine Modified independence Supine to sit Modified independence Rolling to Right Modified independence Rolling to Left Modified independence  TRANSFERS: Assistive device utilized: Environmental consultant - 2 wheeled  Sit to stand: Modified independence Stand to sit: Modified independence Chair to chair: Modified independence   GAIT: Gait pattern:  decrease stride, flexed trunk, LT knee flexed Distance walked: 170 feet  Assistive device utilized: Walker - 2 wheeled Level of assistance: Modified independence Comments:   FUNCTIONAL TESTs:  2 minute walk test: 90 feet with RW   TODAY'S TREATMENT:  04/16/22 Seated: Heel/toe raises x 20 2# LAQ's 2 x 10 2# hip flexion 2 x 10 Hip adduction with ball 2 x 10 Hip abduction with GTB 2 x 10  Supine: Bridge x 10 Quad set x 10 each (significant extension lag in knees bilaterally) right knee ext supine -25 LTR for hip mobility x 10  Nustep seat 9 arms 7 x 4 min for strengthening and mobility      04/11/22 Review of HEP and goals Seated: Marching 2 x 10 LAQ's 2 x 10 Heel toe raises x 20  Gait training with RW and CGA x 50 ft and then 100 ft with WC following     Eval MMT 2MWT  Seated march Laq   PATIENT EDUCATION: Education details: on Eval findings, POC and HEP  Person educated: Patient and Therapist, music Education method: Explanation Education comprehension: verbalized understanding   HOME EXERCISE PROGRAM:  04/16/22 Quad sets  Access Code: [email or web address] URL: [email or web address] Date: 04/11/2022 Prepared by: [facility] - Rehab  Exercises - Seated March  - 3 x daily - 7 x weekly - 2 sets - 10 reps - Seated Long Arc Quad  - 3 x daily - 7 x weekly - 2 sets - 10 reps - Seated Heel Toe Raises  - 3 x daily - 7 x weekly - 2 sets - 10 reps - Seated Hip Adduction Squeeze with Ball  - 3 x daily  - 7 x weekly - 2 sets - 10 reps - Seated Hip Abduction with Resistance  - 3 x daily - 7 x weekly - 2 sets - 10 reps    GOALS: SHORT TERM GOALS: Target date: 04/17/2022  Patient will be independent with initial HEP and self-management strategies to improve functional outcomes Baseline:  Goal status: met  LONG TERM GOALS: Target date: 05/22/2022  Patient will be independent with advanced HEP and self-management strategies to improve functional outcomes Baseline:  Goal status:ongoing  2.  Patient will report at least 65% overall improvement in subjective complaint to indicate improvement in ability to perform ADLs. Baseline:  Goal status: ongoing  3.  Patient will be able to perform stand x 5 in < 30 seconds to demonstrate improvement in functional mobility and reduced risk for falls.  Baseline: 1 rep in 30 sec time  Goal status: ongoing  4. Patient will have equal to or 5/5 MMT throughout LLE to improve ability to perform functional mobility, stair ambulation and ADLs.  Baseline: See MMT Goal status: ongoing  5. Patient will be able to ambulate at least 200 feet during 2MWT with LRAD to demonstrate improved ability to perform functional mobility and associated tasks. Baseline: 90 feet with RW Goal status: ongoing  ASSESSMENT:  CLINICAL IMPRESSION: Today's session focused on lower extremity strengthening and  mobility.  Patient needs min assist and multiple attempts with sit to stand out of the wheelchair today.  Noted bilateral. knee extension lag and patient tends to stand and walk with flexed knees. Added Nustep for mobility and strengthening and quad sets to address knee extension lag and weakness.  Patient will benefit from skilled physical therapy services to address these deficits to reduce pain and improve level of function with ADLs and functional mobility tasks.   OBJECTIVE IMPAIRMENTS Abnormal gait, decreased activity tolerance, decreased balance, decreased endurance,  decreased mobility, difficulty walking, decreased strength, and improper body mechanics.   ACTIVITY LIMITATIONS standing, transfers, and locomotion level  PARTICIPATION LIMITATIONS: community activity  PERSONAL FACTORS Age, Past/current experiences, and Time since onset of injury/illness/exacerbation are also affecting patient's functional outcome.   REHAB POTENTIAL: Good  CLINICAL DECISION MAKING: Stable/uncomplicated  EVALUATION COMPLEXITY: Low  PLAN: PT FREQUENCY: 2x/week  PT DURATION: 8 weeks  PLANNED INTERVENTIONS: Therapeutic exercises, Therapeutic activity, Neuromuscular re-education, Balance training, Gait training, Patient/Family education, Joint manipulation, Joint mobilization, Stair training, Aquatic Therapy, Dry Needling, Electrical stimulation, Spinal manipulation, Spinal mobilization, Cryotherapy, Moist heat, scar mobilization, Taping, Traction, Ultrasound, Biofeedback, Ionotophoresis 64m/ml Dexamethasone, and Manual therapy.   PLAN FOR NEXT SESSION: Progress LE and postural strength (seated>standing) then balance and gait as tolerated   2:28 PM, 04/16/22 Hershal Eriksson Small Estoria Geary MPT [facility] [place] #[id number]:[phone]

## 2022-04-17 ENCOUNTER — Other Ambulatory Visit: Payer: Self-pay | Admitting: Family Medicine

## 2022-04-17 DIAGNOSIS — R1310 Dysphagia, unspecified: Secondary | ICD-10-CM

## 2022-04-17 DIAGNOSIS — Z8673 Personal history of transient ischemic attack (TIA), and cerebral infarction without residual deficits: Secondary | ICD-10-CM

## 2022-04-17 DIAGNOSIS — R053 Chronic cough: Secondary | ICD-10-CM

## 2022-04-18 ENCOUNTER — Telehealth: Payer: Self-pay | Admitting: *Deleted

## 2022-04-18 DIAGNOSIS — A403 Sepsis due to Streptococcus pneumoniae: Secondary | ICD-10-CM | POA: Diagnosis not present

## 2022-04-18 NOTE — Chronic Care Management (AMB) (Unsigned)
  Care Coordination  Outreach Note  04/18/2022 Name: Renee [name] MRN: [id number] DOB: June 02, 1929   Care Coordination Outreach Attempts  An unsuccessful telephone outreach was attempted today to offer the patient information about available care coordination services as a benefit of their health plan.   Follow Up Plan:  Additional outreach attempts will be made to offer the patient care coordination information and services.   Encounter Outcome:  No Answer  Julian Hy, [place] Direct Dial: [phone]

## 2022-04-19 ENCOUNTER — Ambulatory Visit (HOSPITAL_COMMUNITY): Payer: Medicare HMO

## 2022-04-19 DIAGNOSIS — R2689 Other abnormalities of gait and mobility: Secondary | ICD-10-CM

## 2022-04-19 DIAGNOSIS — M6281 Muscle weakness (generalized): Secondary | ICD-10-CM | POA: Diagnosis not present

## 2022-04-19 DIAGNOSIS — Z7409 Other reduced mobility: Secondary | ICD-10-CM | POA: Diagnosis not present

## 2022-04-19 DIAGNOSIS — Z8673 Personal history of transient ischemic attack (TIA), and cerebral infarction without residual deficits: Secondary | ICD-10-CM | POA: Diagnosis not present

## 2022-04-19 DIAGNOSIS — I5032 Chronic diastolic (congestive) heart failure: Secondary | ICD-10-CM | POA: Diagnosis not present

## 2022-04-19 DIAGNOSIS — R531 Weakness: Secondary | ICD-10-CM | POA: Diagnosis not present

## 2022-04-19 DIAGNOSIS — R5381 Other malaise: Secondary | ICD-10-CM | POA: Diagnosis not present

## 2022-04-19 NOTE — Chronic Care Management (AMB) (Signed)
  Care Coordination   Note   04/19/2022 Name: Renee [name] MRN: [id number] DOB: 06/06/1929  Renee [name] is a 88 y.o. year old female who sees [place], [place] L, NP-C for primary care. I reached out to [employer] by phone today to offer care coordination services.  [name] was given information about Care Coordination services today including:   The Care Coordination services include support from the care team which includes your Nurse Coordinator, Clinical Social Worker, or Pharmacist.  The Care Coordination team is here to help remove barriers to the health concerns and goals most important to you. Care Coordination services are voluntary, and the patient may decline or stop services at any time by request to their care team member.   Care Coordination Consent Status: Patient agreed to services and verbal consent obtained.   Follow up plan:  Telephone appointment with care coordination team member scheduled for:  04/20/2022  Encounter Outcome:  Pt. Scheduled  Julian Hy, [place] Direct Dial: [phone]

## 2022-04-19 NOTE — Therapy (Signed)
OUTPATIENT PHYSICAL THERAPY NEURO EVALUATION   Patient Name: Renee [name] MRN: [id number] DOB:05-09-1929, 88 y.o., female Today's Date: 04/19/2022   PCP: Harland Dingwall NP REFERRING PROVIDER: Girtha Rm, NP-C    PT End of Session - 04/19/22 1421     Visit Number 4    Number of Visits 16    Date for PT Re-Evaluation 05/22/22    Authorization Type [employer]ETNA Medicare    Progress Note Due on Visit 10    PT Start Time 6720    PT Stop Time 1510    PT Time Calculation (min) 45 min    Activity Tolerance Patient tolerated treatment well;Patient limited by fatigue    Behavior During Therapy [facility] for tasks assessed/performed                Past Medical History:  Diagnosis Date   Angioedema    possibly from voltaren   Bronchopneumonia 12/11/2016   Degenerative disc disease    Diabetes mellitus    type II   Hyperlipidemia    Hypertension    LVH (left ventricular hypertrophy)    and atrial enlargement by echo in past with nl EF   Nasal pruritis    Osteoarthritis    Osteopenia    Renal insufficiency    Sleep apnea    Stroke ([place]) 05/2010   Small vessel sobcortical (in Point trial) with Dr Leonie Man, residual L hemiparesis   Vitamin B 12 deficiency 04/08   Past Surgical History:  Procedure Laterality Date   ABDOMINAL HYSTERECTOMY     BSO-fibroids   APPENDECTOMY     BACK SURGERY     COLON SURGERY     due to punctured intestines   EYE SURGERY     cataract extraction   IR THORACENTESIS ASP PLEURAL SPACE W/IMG GUIDE  06/07/2020   KNEE SURGERY     arthroscope   PARS PLANA VITRECTOMY  07/31/2011   Procedure: PARS PLANA VITRECTOMY WITH 25 GAUGE;  Surgeon: [name], MD;  Location: [place];  Service: Ophthalmology;  Laterality: Right;  REMOVAL OF SILICONE OIL AND LASER RIGHT EYE   RETINAL DETACHMENT SURGERY  02/18/11   times 2   SPINE SURGERY  08/09   spinal decompression surgery   Patient Active Problem List   Diagnosis Date Noted   History of UTI 02/28/2022    Physical deconditioning 01/19/2022   Chronic respiratory failure with hypoxia ([place]) 01/19/2022   Asthma, chronic, unspecified asthma severity, with acute exacerbation 01/06/2022   Allergic rhinitis 01/06/2022   Chronic diastolic CHF (congestive heart failure) ([place]) 01/06/2022   Obesity (BMI 30-39.9) 01/06/2022   Acute respiratory failure with hypoxia ([place]) 01/05/2022   Bradycardia 09/01/2021   UTI (urinary tract infection) 08/29/2021   Wheezing on expiration 08/29/2021   Sepsis due to pneumonia ([place]) 06/07/2020   Pleural effusion 05/13/2020   Frequent UTI 05/13/2020   Dizziness 02/12/2020   Normocytic anemia 02/12/2020   Fall involving sidewalk curb 11/29/2019   Contusion of back 11/27/2019   Aortic atherosclerosis ([place]) 07/13/2019   CKD (chronic kidney disease) stage 4, GFR 15-29 ml/min ([place]) 07/01/2019   External hemorrhoid 01/17/2018   History of colitis 01/17/2018   Generalized weakness 12/24/2016   Diabetic retinopathy ([place]) 12/11/2016   History of CVA (cerebrovascular accident) 12/11/2016   Weakness secondary to UTI 10/21/2015   Acute renal failure superimposed on stage 3b chronic kidney disease ([place]) 10/12/2015   CAP (community acquired pneumonia) 10/07/2015   Estrogen deficiency 08/30/2015   Mobility  impaired 06/26/2011   History of retinal detachment 01/10/2011   Sleep apnea 11/28/2010   Anxiety and depression 08/25/2010   Hemiplegia, late effect of cerebrovascular disease ([place]) 07/05/2010   POSTHERPETIC NEURALGIA 11/09/2009   Renal insufficiency 06/29/2008   Chronic back pain 01/26/2008   EDEMA 01/26/2008   B12 deficiency 01/10/2007   Type 2 diabetes, controlled, with retinopathy ([place]) 11/27/2006   Essential hypertension 11/27/2006   FIBROCYSTIC BREAST DISEASE 11/27/2006   ROSACEA 11/27/2006   OSTEOARTHRITIS 11/27/2006   Urinary incontinence 11/27/2006    ONSET DATE: Chronic   REFERRING DIAG: Z86.73 (ICD-10-CM) - History of CVA (cerebrovascular accident)  R53.1 (ICD-10-CM) - Generalized weakness R53.81 (ICD-10-CM) - Physical deconditioning Z74.09 (ICD-10-CM) - Mobility impaired I50.32 (ICD-10-CM) - Chronic diastolic CHF (congestive heart failure) (HCC)   THERAPY DIAG:  Muscle weakness (generalized)  Other abnormalities of gait and mobility  Rationale for Evaluation and Treatment Rehabilitation  SUBJECTIVE:                                                                                                                                                                                              SUBJECTIVE STATEMENT: A little bit sore today; mostly from lying on her back last treatment.  Cough continues to improve.  Reports compliance with HEP.  [name] with her today.    Eval: Patient presents to therapy with complaint of weakness. She states she had a pneumonia in May and was hospitalized. She had some home health therapy following but would like to do some more aggressive therapy for strength and balance. She does walk some with a walker. She has had therapy in the past for LT sided weakness related to history of CVA.   Pt accompanied by: friend [name], primary care giver)   PERTINENT HISTORY: CVA  PAIN:  Are you having pain? No  PRECAUTIONS: Fall  WEIGHT BEARING RESTRICTIONS No  FALLS: Has patient fallen in last 6 months? No  LIVING ENVIRONMENT: Lives with: lives with an adult companion Lives in: House/apartment Stairs: No (ramp) Has following equipment at home: Environmental consultant - 2 wheeled, Wheelchair (manual), shower chair, and Ramped entry  PLOF: Needs assistance with ADLs  PATIENT GOALS  Being able to stand on my own, be able to walk from living room to kitchen.   OBJECTIVE:   DIAGNOSTIC FINDINGS: NA  COGNITION: Overall cognitive status: History of cognitive impairments - at baseline    LOWER EXTREMITY MMT:    MMT Right Eval Left Eval  Hip flexion 4+ 4  Hip extension 5 4+  Hip abduction    Hip adduction     Hip internal  rotation    Hip external rotation    Knee flexion    Knee extension 5 4+  Ankle dorsiflexion    Ankle plantarflexion    Ankle inversion    Ankle eversion    (Blank rows = not tested)  BED MOBILITY:  Sit to supine Modified independence Supine to sit Modified independence Rolling to Right Modified independence Rolling to Left Modified independence  TRANSFERS: Assistive device utilized: Environmental consultant - 2 wheeled  Sit to stand: Modified independence Stand to sit: Modified independence Chair to chair: Modified independence   GAIT: Gait pattern:  decrease stride, flexed trunk, LT knee flexed Distance walked: 170 feet  Assistive device utilized: Walker - 2 wheeled Level of assistance: Modified independence Comments:   FUNCTIONAL TESTs:  2 minute walk test: 90 feet with RW   TODAY'S TREATMENT:  04/19/22  Seated: Heel/ toe raises x 20 3# LAQ's x 20 3# hip flexion x 20 Scapular retractions 2 x 10 Hamstring stretch with physioball 3 x 20" each Ball roll outs for lumbar flexion/extension 2 x 10   Standing: //bars Ambulation down and back 1 time Heel raises x 10 Marching x 10 Hip abduction 2 x 5 Mini squats x 10  Ambulation with RW and CGA with WC following 30 ft      04/16/22 Seated: Heel/toe raises x 20 2# LAQ's 2 x 10 2# hip flexion 2 x 10 Hip adduction with ball 2 x 10 Hip abduction with GTB 2 x 10  Supine: Bridge x 10 Quad set x 10 each (significant extension lag in knees bilaterally) right knee ext supine -25 LTR for hip mobility x 10  Nustep seat 9 arms 7 x 4 min for strengthening and mobility      04/11/22 Review of HEP and goals Seated: Marching 2 x 10 LAQ's 2 x 10 Heel toe raises x 20  Gait training with RW and CGA x 50 ft and then 100 ft with WC following     Eval MMT 2MWT  Seated march Laq   PATIENT EDUCATION: Education details: on Eval findings, POC and HEP  Person educated: Patient and Therapist, music Education  method: Explanation Education comprehension: verbalized understanding   HOME EXERCISE PROGRAM:  04/16/22 Quad sets  Access Code: [email or web address] URL: [email or web address] Date: 04/11/2022 Prepared by: AP - Rehab  Exercises - Seated March  - 3 x daily - 7 x weekly - 2 sets - 10 reps - Seated Long Arc Quad  - 3 x daily - 7 x weekly - 2 sets - 10 reps - Seated Heel Toe Raises  - 3 x daily - 7 x weekly - 2 sets - 10 reps - Seated Hip Adduction Squeeze with Ball  - 3 x daily - 7 x weekly - 2 sets - 10 reps - Seated Hip Abduction with Resistance  - 3 x daily - 7 x weekly - 2 sets - 10 reps    GOALS: SHORT TERM GOALS: Target date: 04/17/2022  Patient will be independent with initial HEP and self-management strategies to improve functional outcomes Baseline:  Goal status: met  LONG TERM GOALS: Target date: 05/22/2022  Patient will be independent with advanced HEP and self-management strategies to improve functional outcomes Baseline:  Goal status:ongoing  2.  Patient will report at least 65% overall improvement in subjective complaint to indicate improvement in ability to perform ADLs. Baseline:  Goal status: ongoing  3.  Patient will be able to perform stand x 5 in < 30 seconds to  demonstrate improvement in functional mobility and reduced risk for falls.  Baseline: 1 rep in 30 sec time  Goal status: ongoing  4. Patient will have equal to or 5/5 MMT throughout LLE to improve ability to perform functional mobility, stair ambulation and ADLs.  Baseline: See MMT Goal status: ongoing  5. Patient will be able to ambulate at least 200 feet during 2MWT with LRAD to demonstrate improved ability to perform functional mobility and associated tasks. Baseline: 90 feet with RW Goal status: ongoing  ASSESSMENT:  CLINICAL IMPRESSION: Today's session focused on lower extremity strengthening and mobility. Increased weight of  LAQ's and hip Flexion; patient fatigues quicker left leg;  weaker side from CVA.  Initiated standing exercise today. Patient continues to stand with forward flexed trunk and flexed knees; she improves posturing with cues but knees stay flexed.  Added hamstring stretch to address extension lag. Patient without hearing aids today so HOH; needs commands repeated.Needs tactile cues for scapular retraction performance.  CGA for all standing exercises; occasionally needs min A for sit to stand today with extra time. Patient will benefit from skilled physical therapy services to address these deficits to reduce pain and improve level of function with ADLs and functional mobility tasks.   OBJECTIVE IMPAIRMENTS Abnormal gait, decreased activity tolerance, decreased balance, decreased endurance, decreased mobility, difficulty walking, decreased strength, and improper body mechanics.   ACTIVITY LIMITATIONS standing, transfers, and locomotion level  PARTICIPATION LIMITATIONS: community activity  PERSONAL FACTORS Age, Past/current experiences, and Time since onset of injury/illness/exacerbation are also affecting patient's functional outcome.   REHAB POTENTIAL: Good  CLINICAL DECISION MAKING: Stable/uncomplicated  EVALUATION COMPLEXITY: Low  PLAN: PT FREQUENCY: 2x/week  PT DURATION: 8 weeks  PLANNED INTERVENTIONS: Therapeutic exercises, Therapeutic activity, Neuromuscular re-education, Balance training, Gait training, Patient/Family education, Joint manipulation, Joint mobilization, Stair training, Aquatic Therapy, Dry Needling, Electrical stimulation, Spinal manipulation, Spinal mobilization, Cryotherapy, Moist heat, scar mobilization, Taping, Traction, Ultrasound, Biofeedback, Ionotophoresis 74m/ml Dexamethasone, and Manual therapy.   PLAN FOR NEXT SESSION: Progress LE and postural strength (seated>standing) then balance and gait as tolerated ; will be out of town next week and return to therapy the following week.    3:16 PM, 04/19/22 Kayn Haymore Small Adalind Weitz  MPT [place] [facility] [place] #[phone]

## 2022-04-20 ENCOUNTER — Ambulatory Visit: Payer: Self-pay

## 2022-04-20 NOTE — Patient Outreach (Signed)
  Care Coordination   04/20/2022 Name: Renee [name] MRN: [id number] DOB: 25-May-1929   Care Coordination Outreach Attempts:  Contact was made with the patient today to offer care coordination services as a benefit of their health plan. The patient requested a return call on a later date.   Follow Up Plan:  Additional outreach attempts will be made to offer the patient care coordination information and services.   Encounter Outcome:  Pt. Request to Call Back  Care Coordination Interventions Activated:  No   Care Coordination Interventions:  No, not indicated    Thea Silversmith, RN, MSN, BSN, CCM Care Coordinator [phone]

## 2022-04-30 ENCOUNTER — Ambulatory Visit (HOSPITAL_COMMUNITY): Payer: Medicare HMO | Attending: Family Medicine | Admitting: Physical Therapy

## 2022-04-30 DIAGNOSIS — M6281 Muscle weakness (generalized): Secondary | ICD-10-CM | POA: Insufficient documentation

## 2022-04-30 DIAGNOSIS — R2689 Other abnormalities of gait and mobility: Secondary | ICD-10-CM | POA: Insufficient documentation

## 2022-04-30 NOTE — Therapy (Signed)
OUTPATIENT PHYSICAL THERAPY NEURO EVALUATION   Patient Name: Renee [name] MRN: [id number] DOB:07/05/1929, 88 y.o., female Today's Date: 04/30/2022   PCP: Harland Dingwall NP REFERRING PROVIDER: Girtha Rm, NP-C    PT End of Session - 04/30/22 1521     Visit Number 5    Number of Visits 16    Date for PT Re-Evaluation 05/22/22    Authorization Type AETNA Medicare    Progress Note Due on Visit 10    PT Start Time 1519    PT Stop Time 1558    PT Time Calculation (min) 39 min    Activity Tolerance Patient tolerated treatment well;Patient limited by fatigue    Behavior During Therapy [facility] for tasks assessed/performed                Past Medical History:  Diagnosis Date   Angioedema    possibly from voltaren   Bronchopneumonia 12/11/2016   Degenerative disc disease    Diabetes mellitus    type II   Hyperlipidemia    Hypertension    LVH (left ventricular hypertrophy)    and atrial enlargement by echo in past with nl EF   Nasal pruritis    Osteoarthritis    Osteopenia    Renal insufficiency    Sleep apnea    Stroke ([place]) 05/2010   Small vessel sobcortical (in Point trial) with Dr Leonie Man, residual L hemiparesis   Vitamin B 12 deficiency 04/08   Past Surgical History:  Procedure Laterality Date   ABDOMINAL HYSTERECTOMY     BSO-fibroids   APPENDECTOMY     BACK SURGERY     COLON SURGERY     due to punctured intestines   EYE SURGERY     cataract extraction   IR THORACENTESIS ASP PLEURAL SPACE W/IMG GUIDE  06/07/2020   KNEE SURGERY     arthroscope   PARS PLANA VITRECTOMY  07/31/2011   Procedure: PARS PLANA VITRECTOMY WITH 25 GAUGE;  Surgeon: [name], MD;  Location: [place];  Service: Ophthalmology;  Laterality: Right;  REMOVAL OF SILICONE OIL AND LASER RIGHT EYE   RETINAL DETACHMENT SURGERY  02/18/11   times 2   SPINE SURGERY  08/09   spinal decompression surgery   Patient Active Problem List   Diagnosis Date Noted   History of UTI 02/28/2022    Physical deconditioning 01/19/2022   Chronic respiratory failure with hypoxia ([place]) 01/19/2022   Asthma, chronic, unspecified asthma severity, with acute exacerbation 01/06/2022   Allergic rhinitis 01/06/2022   Chronic diastolic CHF (congestive heart failure) ([place]) 01/06/2022   Obesity (BMI 30-39.9) 01/06/2022   Acute respiratory failure with hypoxia ([place]) 01/05/2022   Bradycardia 09/01/2021   UTI (urinary tract infection) 08/29/2021   Wheezing on expiration 08/29/2021   Sepsis due to pneumonia ([place]) 06/07/2020   Pleural effusion 05/13/2020   Frequent UTI 05/13/2020   Dizziness 02/12/2020   Normocytic anemia 02/12/2020   Fall involving sidewalk curb 11/29/2019   Contusion of back 11/27/2019   Aortic atherosclerosis ([place]) 07/13/2019   CKD (chronic kidney disease) stage 4, GFR 15-29 ml/min ([place]) 07/01/2019   External hemorrhoid 01/17/2018   History of colitis 01/17/2018   Generalized weakness 12/24/2016   Diabetic retinopathy ([place]) 12/11/2016   History of CVA (cerebrovascular accident) 12/11/2016   Weakness secondary to UTI 10/21/2015   Acute renal failure superimposed on stage 3b chronic kidney disease ([place]) 10/12/2015   CAP (community acquired pneumonia) 10/07/2015   Estrogen deficiency 08/30/2015   Mobility  impaired 06/26/2011   History of retinal detachment 01/10/2011   Sleep apnea 11/28/2010   Anxiety and depression 08/25/2010   Hemiplegia, late effect of cerebrovascular disease ([place]) 07/05/2010   POSTHERPETIC NEURALGIA 11/09/2009   Renal insufficiency 06/29/2008   Chronic back pain 01/26/2008   EDEMA 01/26/2008   B12 deficiency 01/10/2007   Type 2 diabetes, controlled, with retinopathy ([place]) 11/27/2006   Essential hypertension 11/27/2006   FIBROCYSTIC BREAST DISEASE 11/27/2006   ROSACEA 11/27/2006   OSTEOARTHRITIS 11/27/2006   Urinary incontinence 11/27/2006    ONSET DATE: Chronic   REFERRING DIAG: Z86.73 (ICD-10-CM) - History of CVA (cerebrovascular accident)  R53.1 (ICD-10-CM) - Generalized weakness R53.81 (ICD-10-CM) - Physical deconditioning Z74.09 (ICD-10-CM) - Mobility impaired I50.32 (ICD-10-CM) - Chronic diastolic CHF (congestive heart failure) (HCC)   THERAPY DIAG:  Muscle weakness (generalized)  Other abnormalities of gait and mobility  Rationale for Evaluation and Treatment Rehabilitation  SUBJECTIVE:                                                                                                                                                                                              SUBJECTIVE STATEMENT: Ongoing difficulty with getting up out of chair and off of commode. Reports compliance with HEP.    Eval: Patient presents to therapy with complaint of weakness. She states she had a pneumonia in May and was hospitalized. She had some home health therapy following but would like to do some more aggressive therapy for strength and balance. She does walk some with a walker. She has had therapy in the past for LT sided weakness related to history of CVA.   Pt accompanied by: friend [name], primary care giver)   PERTINENT HISTORY: CVA  PAIN:  Are you having pain? No  PRECAUTIONS: Fall  WEIGHT BEARING RESTRICTIONS No  FALLS: Has patient fallen in last 6 months? No  LIVING ENVIRONMENT: Lives with: lives with an adult companion Lives in: House/apartment Stairs: No (ramp) Has following equipment at home: Environmental consultant - 2 wheeled, Wheelchair (manual), shower chair, and Ramped entry  PLOF: Needs assistance with ADLs  PATIENT GOALS  Being able to stand on my own, be able to walk from living room to kitchen.   OBJECTIVE:   DIAGNOSTIC FINDINGS: NA  COGNITION: Overall cognitive status: History of cognitive impairments - at baseline    LOWER EXTREMITY MMT:    MMT Right Eval Left Eval  Hip flexion 4+ 4  Hip extension 5 4+  Hip abduction    Hip adduction    Hip internal rotation    Hip external rotation    Knee flexion  Knee extension 5 4+  Ankle dorsiflexion    Ankle plantarflexion    Ankle inversion    Ankle eversion    (Blank rows = not tested)  BED MOBILITY:  Sit to supine Modified independence Supine to sit Modified independence Rolling to Right Modified independence Rolling to Left Modified independence  TRANSFERS: Assistive device utilized: Environmental consultant - 2 wheeled  Sit to stand: Modified independence Stand to sit: Modified independence Chair to chair: Modified independence   GAIT: Gait pattern:  decrease stride, flexed trunk, LT knee flexed Distance walked: 170 feet  Assistive device utilized: Walker - 2 wheeled Level of assistance: Modified independence Comments:   FUNCTIONAL TESTs:  2 minute walk test: 90 feet with RW   TODAY'S TREATMENT:  04/30/22 Seated: Heel/ toe raises x 20 3# LAQ's x 20 3# hip flexion x 20 Scapular retractions 2 x 10  Sidestepping in bars with 3 lb ankle weights  3 RT Heel raises x 10 Marching with 3 ob weights 2 x 10  Ambulation with RW and CGA with WC following 60 ft  04/19/22  Seated: Heel/ toe raises x 20 3# LAQ's x 20 3# hip flexion x 20 Scapular retractions 2 x 10 Hamstring stretch with physioball 3 x 20" each Ball roll outs for lumbar flexion/extension 2 x 10   Standing: //bars Ambulation down and back 1 time Heel raises x 10 Marching x 10 Hip abduction 2 x 5 Mini squats x 10  Ambulation with RW and CGA with WC following 30 ft  PATIENT EDUCATION: Education details: on Eval findings, POC and HEP  Person educated: Patient and Therapist, music Education method: Explanation Education comprehension: verbalized understanding   HOME EXERCISE PROGRAM:  04/16/22 Quad sets  Access Code: [email or web address] URL: [email or web address] Date: 04/11/2022 Prepared by: AP - Rehab  Exercises - Seated March  - 3 x daily - 7 x weekly - 2 sets - 10 reps - Seated Long Arc Quad  - 3 x daily - 7 x weekly - 2 sets - 10 reps - Seated Heel  Toe Raises  - 3 x daily - 7 x weekly - 2 sets - 10 reps - Seated Hip Adduction Squeeze with Ball  - 3 x daily - 7 x weekly - 2 sets - 10 reps - Seated Hip Abduction with Resistance  - 3 x daily - 7 x weekly - 2 sets - 10 reps    GOALS: SHORT TERM GOALS: Target date: 04/17/2022  Patient will be independent with initial HEP and self-management strategies to improve functional outcomes Baseline:  Goal status: met  LONG TERM GOALS: Target date: 05/22/2022  Patient will be independent with advanced HEP and self-management strategies to improve functional outcomes Baseline:  Goal status:ongoing  2.  Patient will report at least 65% overall improvement in subjective complaint to indicate improvement in ability to perform ADLs. Baseline:  Goal status: ongoing  3.  Patient will be able to perform stand x 5 in < 30 seconds to demonstrate improvement in functional mobility and reduced risk for falls.  Baseline: 1 rep in 30 sec time  Goal status: ongoing  4. Patient will have equal to or 5/5 MMT throughout LLE to improve ability to perform functional mobility, stair ambulation and ADLs.  Baseline: See MMT Goal status: ongoing  5. Patient will be able to ambulate at least 200 feet during 2MWT with LRAD to demonstrate improved ability to perform functional mobility and associated tasks. Baseline: 90 feet with RW Goal status: ongoing  ASSESSMENT:  CLINICAL IMPRESSION: Continued working on activity tolerance ans LE strengthening in standing. Patient tolerated session well, though did require several breaks for rest. She required several cues for hand and feet position during sit to stands. She continues to have difficulty with transfers due to LLE weakness. Increased reps with standing ther ex and added 3 lb ankle weights to standing march. Patient will continue to benefit from skilled therapy services to reduce remaining deficits and improve functional ability.    OBJECTIVE IMPAIRMENTS  Abnormal gait, decreased activity tolerance, decreased balance, decreased endurance, decreased mobility, difficulty walking, decreased strength, and improper body mechanics.   ACTIVITY LIMITATIONS standing, transfers, and locomotion level  PARTICIPATION LIMITATIONS: community activity  PERSONAL FACTORS Age, Past/current experiences, and Time since onset of injury/illness/exacerbation are also affecting patient's functional outcome.   REHAB POTENTIAL: Good  CLINICAL DECISION MAKING: Stable/uncomplicated  EVALUATION COMPLEXITY: Low  PLAN: PT FREQUENCY: 2x/week  PT DURATION: 8 weeks  PLANNED INTERVENTIONS: Therapeutic exercises, Therapeutic activity, Neuromuscular re-education, Balance training, Gait training, Patient/Family education, Joint manipulation, Joint mobilization, Stair training, Aquatic Therapy, Dry Needling, Electrical stimulation, Spinal manipulation, Spinal mobilization, Cryotherapy, Moist heat, scar mobilization, Taping, Traction, Ultrasound, Biofeedback, Ionotophoresis 74m/ml Dexamethasone, and Manual therapy.   PLAN FOR NEXT SESSION: Progress LE and postural strength (seated>standing) then balance and gait as tolerated 3:21 PM, 04/30/22 CJosue HectorPT DPT  [facility] ([phone]

## 2022-05-02 ENCOUNTER — Ambulatory Visit: Payer: Self-pay

## 2022-05-02 NOTE — Patient Outreach (Signed)
  Care Coordination   Initial Visit Note   05/02/2022 Name: MAEVEN MCDOUGALL MRN: [id number] DOB: Mar 06, 1929  Sudie Grumbling Zielke is a 88 y.o. year old female who sees [place], [place] L, NP-C for primary care. I  spoke with caregiver, [name] Pitts(cargiver/DPR) by phone today.  Care Coordinator called to complete assessment. Per Mrs. Charlie Pitter, she is caregiver 7 days/week about 12 hour/day. RN unable to complete assessment to determine if any care coordinator needs as the call dropped and unable to reestablish connection after numerous attempts to return call today.    Goals Addressed             This Visit's Progress    COMPLETED: Care Coordination Activities       Care Coordination Interventions: Assessed social determinant of health barriers Request care guide contact to reschedule patient to complete assessment         SDOH assessments and interventions completed:  Yes  SDOH Interventions Today    Flowsheet Row Most Recent Value  SDOH Interventions   Food Insecurity Interventions Intervention Not Indicated  Housing Interventions Intervention Not Indicated  Transportation Interventions Intervention Not Indicated  Utilities Interventions Intervention Not Indicated        Care Coordination Interventions Activated:  Yes  Care Coordination Interventions:  Yes, provided   Follow up plan:  Care Coordinator will request care guide call to reschedule    Encounter Outcome:  Pt. Visit Completed   Thea Silversmith, RN, MSN, BSN, [place] Coordinator [phone]

## 2022-05-03 ENCOUNTER — Ambulatory Visit (HOSPITAL_COMMUNITY): Payer: Medicare HMO

## 2022-05-03 DIAGNOSIS — R2689 Other abnormalities of gait and mobility: Secondary | ICD-10-CM

## 2022-05-03 DIAGNOSIS — M6281 Muscle weakness (generalized): Secondary | ICD-10-CM | POA: Diagnosis not present

## 2022-05-03 NOTE — Therapy (Signed)
OUTPATIENT PHYSICAL THERAPY NEURO TREATMENT   Patient Name: Renee [name] MRN: [id number] DOB:[birth year], 88 y.o., female Today's Date: 05/03/2022   PCP: Harland Dingwall NP REFERRING PROVIDER: Girtha Rm, NP-C    PT End of Session - 05/03/22 1319     Visit Number 6    Number of Visits 16    Date for PT Re-Evaluation 05/22/22    Authorization Type [employer] Medicare    Progress Note Due on Visit 10    PT Start Time 1312   late sign in   PT Stop Time 1345    PT Time Calculation (min) 33 min    Activity Tolerance Patient tolerated treatment well;Patient limited by fatigue;No increased pain    Behavior During Therapy WFL for tasks assessed/performed                 Past Medical History:  Diagnosis Date   Angioedema    possibly from voltaren   Bronchopneumonia 12/11/2016   Degenerative disc disease    Diabetes mellitus    type II   Hyperlipidemia    Hypertension    LVH (left ventricular hypertrophy)    and atrial enlargement by echo in past with nl EF   Nasal pruritis    Osteoarthritis    Osteopenia    Renal insufficiency    Sleep apnea    Stroke ([place]) 05/2010   Small vessel sobcortical (in [place]) with Dr Leonie Man, residual L hemiparesis   Vitamin B 12 deficiency 04/08   Past Surgical History:  Procedure Laterality Date   ABDOMINAL HYSTERECTOMY     BSO-fibroids   APPENDECTOMY     BACK SURGERY     COLON SURGERY     due to punctured intestines   EYE SURGERY     cataract extraction   IR THORACENTESIS ASP PLEURAL SPACE W/IMG GUIDE  06/07/2020   KNEE SURGERY     arthroscope   PARS PLANA VITRECTOMY  07/31/2011   Procedure: PARS PLANA VITRECTOMY WITH 25 GAUGE;  Surgeon: [name], MD;  Location: [facility];  Service: Ophthalmology;  Laterality: Right;  REMOVAL OF SILICONE OIL AND LASER RIGHT EYE   RETINAL DETACHMENT SURGERY  02/18/11   times 2   SPINE SURGERY  08/09   spinal decompression surgery   Patient Active Problem List   Diagnosis Date Noted    History of UTI 02/28/2022   Physical deconditioning 01/19/2022   Chronic respiratory failure with hypoxia ([place]) 01/19/2022   Asthma, chronic, unspecified asthma severity, with acute exacerbation 01/06/2022   Allergic rhinitis 01/06/2022   Chronic diastolic CHF (congestive heart failure) ([place]) 01/06/2022   Obesity (BMI 30-39.9) 01/06/2022   Acute respiratory failure with hypoxia ([place]) 01/05/2022   Bradycardia 09/01/2021   UTI (urinary tract infection) 08/29/2021   Wheezing on expiration 08/29/2021   Sepsis due to pneumonia ([place]) 06/07/2020   Pleural effusion 05/13/2020   Frequent UTI 05/13/2020   Dizziness 02/12/2020   Normocytic anemia 02/12/2020   Fall involving sidewalk curb 11/29/2019   Contusion of back 11/27/2019   Aortic atherosclerosis ([place]) 07/13/2019   CKD (chronic kidney disease) stage 4, GFR 15-29 ml/min ([place]) 07/01/2019   External hemorrhoid 01/17/2018   History of colitis 01/17/2018   Generalized weakness 12/24/2016   Diabetic retinopathy ([place]) 12/11/2016   History of CVA (cerebrovascular accident) 12/11/2016   Weakness secondary to UTI 10/21/2015   Acute renal failure superimposed on stage 3b chronic kidney disease ([place]) 10/12/2015   CAP (community acquired pneumonia) 10/07/2015  Estrogen deficiency 08/30/2015   Mobility impaired 06/26/2011   History of retinal detachment 01/10/2011   Sleep apnea 11/28/2010   Anxiety and depression 08/25/2010   Hemiplegia, late effect of cerebrovascular disease ([place]) 07/05/2010   POSTHERPETIC NEURALGIA 11/09/2009   Renal insufficiency 06/29/2008   Chronic back pain 01/26/2008   EDEMA 01/26/2008   B12 deficiency 01/10/2007   Type 2 diabetes, controlled, with retinopathy ([place]) 11/27/2006   Essential hypertension 11/27/2006   FIBROCYSTIC BREAST DISEASE 11/27/2006   ROSACEA 11/27/2006   OSTEOARTHRITIS 11/27/2006   Urinary incontinence 11/27/2006    ONSET DATE: Chronic   REFERRING DIAG: Z86.73 (ICD-10-CM) - History of CVA  (cerebrovascular accident) R53.1 (ICD-10-CM) - Generalized weakness R53.81 (ICD-10-CM) - Physical deconditioning Z74.09 (ICD-10-CM) - Mobility impaired I50.32 (ICD-10-CM) - Chronic diastolic CHF (congestive heart failure) (HCC)   THERAPY DIAG:  Muscle weakness (generalized)  Other abnormalities of gait and mobility  Rationale for Evaluation and Treatment Rehabilitation  SUBJECTIVE:                                                                                                                                                                                              SUBJECTIVE STATEMENT: Pt arrived with caregiver in [facility]  Stated she is feeling good today, no reports of pain.  Reports compliance with HEP daily.  Stated most difficulty with sit to standing.  Feels her left arm is weaker, not interested in OT.     Eval: Patient presents to therapy with complaint of weakness. She states she had a pneumonia in May and was hospitalized. She had some home health therapy following but would like to do some more aggressive therapy for strength and balance. She does walk some with a walker. She has had therapy in the past for LT sided weakness related to history of CVA.   Pt accompanied by: friend [name], primary care giver)   PERTINENT HISTORY: CVA  PAIN:  Are you having pain? No  PRECAUTIONS: Fall  WEIGHT BEARING RESTRICTIONS No  FALLS: Has patient fallen in last 6 months? No  LIVING ENVIRONMENT: Lives with: lives with an adult companion Lives in: House/apartment Stairs: No (ramp) Has following equipment at home: Environmental consultant - 2 wheeled, Wheelchair (manual), shower chair, and Ramped entry  PLOF: Needs assistance with ADLs  PATIENT GOALS  Being able to stand on my own, be able to walk from living room to kitchen.   OBJECTIVE:   DIAGNOSTIC FINDINGS: NA  COGNITION: Overall cognitive status: History of cognitive impairments - at baseline    LOWER EXTREMITY MMT:    MMT Right Eval  Left Eval  Hip flexion  4+ 4  Hip extension 5 4+  Hip abduction    Hip adduction    Hip internal rotation    Hip external rotation    Knee flexion    Knee extension 5 4+  Ankle dorsiflexion    Ankle plantarflexion    Ankle inversion    Ankle eversion    (Blank rows = not tested)  BED MOBILITY:  Sit to supine Modified independence Supine to sit Modified independence Rolling to Right Modified independence Rolling to Left Modified independence  TRANSFERS: Assistive device utilized: Environmental consultant - 2 wheeled  Sit to stand: Modified independence Stand to sit: Modified independence Chair to chair: Modified independence   GAIT: Gait pattern:  decrease stride, flexed trunk, LT knee flexed Distance walked: 170 feet  Assistive device utilized: Walker - 2 wheeled Level of assistance: Modified independence Comments:   FUNCTIONAL TESTs:  2 minute walk test: 90 feet with RW   TODAY'S TREATMENT:  9/14/263 STS from mat, standing with RW 2x 5 reps eccentric control. Gait training with RW WC behind CGA x 241ft Supine: Bridge 2x 10 Standing:  heel raises 15x  Sidestep 3RT inside //bars  Minisquat front of WC with HHA 10x Seated hamstring stretch 2x 20" BLE on step  04/30/22 Seated: Heel/ toe raises x 20 3# LAQ's x 20 3# hip flexion x 20 Scapular retractions 2 x 10  Sidestepping in bars with 3 lb ankle weights  3 RT Heel raises x 10 Marching with 3 ob weights 2 x 10  Ambulation with RW and CGA with WC following 60 ft  04/19/22  Seated: Heel/ toe raises x 20 3# LAQ's x 20 3# hip flexion x 20 Scapular retractions 2 x 10 Hamstring stretch with physioball 3 x 20" each Ball roll outs for lumbar flexion/extension 2 x 10   Standing: //bars Ambulation down and back 1 time Heel raises x 10 Marching x 10 Hip abduction 2 x 5 Mini squats x 10  Ambulation with RW and CGA with WC following 30 ft  PATIENT EDUCATION: Education details: on Eval findings, POC and HEP  Person  educated: Patient and Therapist, music Education method: Explanation Education comprehension: verbalized understanding   HOME EXERCISE PROGRAM:  04/16/22 Quad sets  Access Code: [email or web address] URL: [email or web address] Date: 04/11/2022 Prepared by: AP - Rehab  Exercises - Seated March  - 3 x daily - 7 x weekly - 2 sets - 10 reps - Seated Long Arc Quad  - 3 x daily - 7 x weekly - 2 sets - 10 reps - Seated Heel Toe Raises  - 3 x daily - 7 x weekly - 2 sets - 10 reps - Seated Hip Adduction Squeeze with Ball  - 3 x daily - 7 x weekly - 2 sets - 10 reps - Seated Hip Abduction with Resistance  - 3 x daily - 7 x weekly - 2 sets - 10 reps  05/03/22:  Bridge   GOALS: SHORT TERM GOALS: Target date: 04/17/2022  Patient will be independent with initial HEP and self-management strategies to improve functional outcomes Baseline:  Goal status: met  LONG TERM GOALS: Target date: 05/22/2022  Patient will be independent with advanced HEP and self-management strategies to improve functional outcomes Baseline:  Goal status:ongoing  2.  Patient will report at least 65% overall improvement in subjective complaint to indicate improvement in ability to perform ADLs. Baseline:  Goal status: ongoing  3.  Patient will be able to perform stand x 5 in < 30  seconds to demonstrate improvement in functional mobility and reduced risk for falls.  Baseline: 1 rep in 30 sec time  Goal status: ongoing  4. Patient will have equal to or 5/5 MMT throughout LLE to improve ability to perform functional mobility, stair ambulation and ADLs.  Baseline: See MMT Goal status: ongoing  5. Patient will be able to ambulate at least 200 feet during 2MWT with LRAD to demonstrate improved ability to perform functional mobility and associated tasks. Baseline: 90 feet with RW Goal status: ongoing  ASSESSMENT:  CLINICAL IMPRESSION: Increased focus with gluteal strengthening to assist with STS, added bridges to  HEP with min cueing for proper form.  Cueing required for safety with hand and foot placement.  Pt does present with extension lag BLE knees, cueing for isometric quad sets during standing and gait.  Improved distance with gait training using RW and WC behind for safety, no LOB, does present with slow cadence.  No reports of pain through session.    OBJECTIVE IMPAIRMENTS Abnormal gait, decreased activity tolerance, decreased balance, decreased endurance, decreased mobility, difficulty walking, decreased strength, and improper body mechanics.   ACTIVITY LIMITATIONS standing, transfers, and locomotion level  PARTICIPATION LIMITATIONS: community activity  PERSONAL FACTORS Age, Past/current experiences, and Time since onset of injury/illness/exacerbation are also affecting patient's functional outcome.   REHAB POTENTIAL: Good  CLINICAL DECISION MAKING: Stable/uncomplicated  EVALUATION COMPLEXITY: Low  PLAN: PT FREQUENCY: 2x/week  PT DURATION: 8 weeks  PLANNED INTERVENTIONS: Therapeutic exercises, Therapeutic activity, Neuromuscular re-education, Balance training, Gait training, Patient/Family education, Joint manipulation, Joint mobilization, Stair training, Aquatic Therapy, Dry Needling, Electrical stimulation, Spinal manipulation, Spinal mobilization, Cryotherapy, Moist heat, scar mobilization, Taping, Traction, Ultrasound, Biofeedback, Ionotophoresis 43m/ml Dexamethasone, and Manual therapy.   PLAN FOR NEXT SESSION: Progress LE and postural strength (seated>standing) then balance and gait as tolerated.  CIhor Austin LPTA/CLT; CBIS [phone] 2:49 PM, 05/03/22

## 2022-05-04 ENCOUNTER — Ambulatory Visit (HOSPITAL_COMMUNITY)
Admission: RE | Admit: 2022-05-04 | Discharge: 2022-05-04 | Disposition: A | Discharge status: Home / Self Care | Payer: Medicare HMO | Source: Ambulatory Visit | Attending: Family Medicine | Admitting: Family Medicine

## 2022-05-04 ENCOUNTER — Other Ambulatory Visit: Payer: Self-pay | Admitting: Family Medicine

## 2022-05-04 DIAGNOSIS — R1311 Dysphagia, oral phase: Secondary | ICD-10-CM | POA: Insufficient documentation

## 2022-05-04 DIAGNOSIS — R1310 Dysphagia, unspecified: Secondary | ICD-10-CM

## 2022-05-04 DIAGNOSIS — Z8673 Personal history of transient ischemic attack (TIA), and cerebral infarction without residual deficits: Secondary | ICD-10-CM | POA: Insufficient documentation

## 2022-05-04 DIAGNOSIS — R053 Chronic cough: Secondary | ICD-10-CM | POA: Diagnosis not present

## 2022-05-04 DIAGNOSIS — R051 Acute cough: Secondary | ICD-10-CM

## 2022-05-04 DIAGNOSIS — K219 Gastro-esophageal reflux disease without esophagitis: Secondary | ICD-10-CM | POA: Diagnosis not present

## 2022-05-07 ENCOUNTER — Other Ambulatory Visit: Payer: Self-pay | Admitting: Family Medicine

## 2022-05-07 ENCOUNTER — Other Ambulatory Visit: Payer: Self-pay | Admitting: Pulmonary Disease

## 2022-05-07 ENCOUNTER — Ambulatory Visit (HOSPITAL_COMMUNITY): Payer: Medicare HMO | Admitting: Physical Therapy

## 2022-05-07 ENCOUNTER — Telehealth: Payer: Self-pay | Admitting: *Deleted

## 2022-05-07 ENCOUNTER — Telehealth: Payer: Self-pay

## 2022-05-07 ENCOUNTER — Encounter (HOSPITAL_COMMUNITY): Payer: Medicare HMO | Admitting: Physical Therapy

## 2022-05-07 DIAGNOSIS — H6505 Acute serous otitis media, recurrent, left ear: Secondary | ICD-10-CM | POA: Diagnosis not present

## 2022-05-07 DIAGNOSIS — J3489 Other specified disorders of nose and nasal sinuses: Secondary | ICD-10-CM | POA: Diagnosis not present

## 2022-05-07 DIAGNOSIS — Z9622 Myringotomy tube(s) status: Secondary | ICD-10-CM | POA: Diagnosis not present

## 2022-05-07 DIAGNOSIS — H6123 Impacted cerumen, bilateral: Secondary | ICD-10-CM | POA: Diagnosis not present

## 2022-05-07 MED ORDER — HYDROCHLOROTHIAZIDE 12.5 MG PO CAPS: 12.5000 mg | ORAL_CAPSULE | Freq: Every day | ORAL | 2 refills | Status: DC

## 2022-05-07 MED ORDER — SITAGLIPTIN PHOSPHATE 100 MG PO TABS: 100.0000 mg | ORAL_TABLET | Freq: Every day | ORAL | 2 refills | Status: DC

## 2022-05-07 NOTE — Telephone Encounter (Signed)
Caregiver was contacted about the approval of the Refills.

## 2022-05-07 NOTE — Chronic Care Management (AMB) (Signed)
  Care Coordination  Outreach Note  05/07/2022 Name: Renee [name] MRN: [id number] DOB: [birth year]   Care Coordination Outreach Attempts: An unsuccessful telephone outreach was attempted today to offer the patient information about available care coordination services as a benefit of their health plan.   Rescheduling   Follow Up Plan:  Additional outreach attempts will be made to offer the patient care coordination information and services.   Encounter Outcome:  No Answer  Julian Hy, [place] Direct Dial: [phone]

## 2022-05-07 NOTE — Telephone Encounter (Signed)
This will be her first fill with you.. ok to refill?  

## 2022-05-07 NOTE — Telephone Encounter (Signed)
Rx sent for 3 months

## 2022-05-07 NOTE — Telephone Encounter (Signed)
Caregiver is requesting refills for pt: JANUVIA 100 MG tablet hydrochlorothiazide (MICROZIDE) 12.5 MG capsule  Pharmacy: [id number] [place], [place] LOV 04/13/22

## 2022-05-07 NOTE — Telephone Encounter (Signed)
pt no longer under Dr. Samella Parr care has new PCP

## 2022-05-08 ENCOUNTER — Telehealth: Payer: Self-pay

## 2022-05-08 NOTE — Telephone Encounter (Signed)
Pt is requesting refill of her glipizide, she has been out for 4 days now. Rx request was sent to Dr. Dennard Schaumann by accident.   Please advise if 3 month fill is ok, as this is her first fill with you.

## 2022-05-09 DIAGNOSIS — H65492 Other chronic nonsuppurative otitis media, left ear: Secondary | ICD-10-CM | POA: Diagnosis not present

## 2022-05-09 DIAGNOSIS — H90A12 Conductive hearing loss, unilateral, left ear with restricted hearing on the contralateral side: Secondary | ICD-10-CM | POA: Diagnosis not present

## 2022-05-09 LAB — HM DIABETES EYE EXAM

## 2022-05-09 MED ORDER — GLIPIZIDE ER 5 MG PO TB24: 5.0000 mg | ORAL_TABLET | Freq: Every day | ORAL | 2 refills | Status: DC

## 2022-05-09 NOTE — Addendum Note (Signed)
Addended by: Rossie Muskrat on: 05/09/2022 08:33 AM   Modules accepted: Orders

## 2022-05-09 NOTE — Telephone Encounter (Signed)
Rx for 3 months sent

## 2022-05-10 ENCOUNTER — Ambulatory Visit (HOSPITAL_COMMUNITY): Payer: Medicare HMO | Admitting: Physical Therapy

## 2022-05-10 ENCOUNTER — Telehealth: Payer: Self-pay | Admitting: Family Medicine

## 2022-05-10 DIAGNOSIS — R2689 Other abnormalities of gait and mobility: Secondary | ICD-10-CM

## 2022-05-10 DIAGNOSIS — M6281 Muscle weakness (generalized): Secondary | ICD-10-CM

## 2022-05-10 NOTE — Therapy (Signed)
OUTPATIENT PHYSICAL THERAPY NEURO TREATMENT   Patient Name: Renee [name] MRN: [id number] DOB:01/16/1929, 88 y.o., female Today's Date: 05/10/2022   PCP: Harland Dingwall NP REFERRING PROVIDER: Girtha Rm, NP-C    PT End of Session - 05/10/22 1305     Visit Number 7    Number of Visits 16    Date for PT Re-Evaluation 05/22/22    Authorization Type AETNA Medicare    Progress Note Due on Visit 10    PT Start Time 1304    PT Stop Time 1342    PT Time Calculation (min) 38 min    Activity Tolerance Patient tolerated treatment well    Behavior During Therapy WFL for tasks assessed/performed                 Past Medical History:  Diagnosis Date   Angioedema    possibly from voltaren   Bronchopneumonia 12/11/2016   Degenerative disc disease    Diabetes mellitus    type II   Hyperlipidemia    Hypertension    LVH (left ventricular hypertrophy)    and atrial enlargement by echo in past with nl EF   Nasal pruritis    Osteoarthritis    Osteopenia    Renal insufficiency    Sleep apnea    Stroke ([place]) 05/2010   Small vessel sobcortical (in Point trial) with Dr Leonie Man, residual L hemiparesis   Vitamin B 12 deficiency 04/08   Past Surgical History:  Procedure Laterality Date   ABDOMINAL HYSTERECTOMY     BSO-fibroids   APPENDECTOMY     BACK SURGERY     COLON SURGERY     due to punctured intestines   EYE SURGERY     cataract extraction   IR THORACENTESIS ASP PLEURAL SPACE W/IMG GUIDE  06/07/2020   KNEE SURGERY     arthroscope   PARS PLANA VITRECTOMY  07/31/2011   Procedure: PARS PLANA VITRECTOMY WITH 25 GAUGE;  Surgeon: [name], MD;  Location: [facility];  Service: Ophthalmology;  Laterality: Right;  REMOVAL OF SILICONE OIL AND LASER RIGHT EYE   RETINAL DETACHMENT SURGERY  02/18/11   times 2   SPINE SURGERY  08/09   spinal decompression surgery   Patient Active Problem List   Diagnosis Date Noted   History of UTI 02/28/2022   Physical deconditioning  01/19/2022   Chronic respiratory failure with hypoxia ([place]) 01/19/2022   Asthma, chronic, unspecified asthma severity, with acute exacerbation 01/06/2022   Allergic rhinitis 01/06/2022   Chronic diastolic CHF (congestive heart failure) ([place]) 01/06/2022   Obesity (BMI 30-39.9) 01/06/2022   Acute respiratory failure with hypoxia ([place]) 01/05/2022   Bradycardia 09/01/2021   UTI (urinary tract infection) 08/29/2021   Wheezing on expiration 08/29/2021   Sepsis due to pneumonia ([place]) 06/07/2020   Pleural effusion 05/13/2020   Frequent UTI 05/13/2020   Dizziness 02/12/2020   Normocytic anemia 02/12/2020   Fall involving sidewalk curb 11/29/2019   Contusion of back 11/27/2019   Aortic atherosclerosis ([place]) 07/13/2019   CKD (chronic kidney disease) stage 4, GFR 15-29 ml/min ([place]) 07/01/2019   External hemorrhoid 01/17/2018   History of colitis 01/17/2018   Generalized weakness 12/24/2016   Diabetic retinopathy ([place]) 12/11/2016   History of CVA (cerebrovascular accident) 12/11/2016   Weakness secondary to UTI 10/21/2015   Acute renal failure superimposed on stage 3b chronic kidney disease ([place]) 10/12/2015   CAP (community acquired pneumonia) 10/07/2015   Estrogen deficiency 08/30/2015   Mobility impaired 06/26/2011  History of retinal detachment 01/10/2011   Sleep apnea 11/28/2010   Anxiety and depression 08/25/2010   Hemiplegia, late effect of cerebrovascular disease ([place]) 07/05/2010   POSTHERPETIC NEURALGIA 11/09/2009   Renal insufficiency 06/29/2008   Chronic back pain 01/26/2008   EDEMA 01/26/2008   B12 deficiency 01/10/2007   Type 2 diabetes, controlled, with retinopathy ([place]) 11/27/2006   Essential hypertension 11/27/2006   FIBROCYSTIC BREAST DISEASE 11/27/2006   ROSACEA 11/27/2006   OSTEOARTHRITIS 11/27/2006   Urinary incontinence 11/27/2006    ONSET DATE: Chronic   REFERRING DIAG: Z86.73 (ICD-10-CM) - History of CVA (cerebrovascular accident) R53.1 (ICD-10-CM) -  Generalized weakness R53.81 (ICD-10-CM) - Physical deconditioning Z74.09 (ICD-10-CM) - Mobility impaired I50.32 (ICD-10-CM) - Chronic diastolic CHF (congestive heart failure) (HCC)   THERAPY DIAG:  Muscle weakness (generalized)  Other abnormalities of gait and mobility  Rationale for Evaluation and Treatment Rehabilitation  SUBJECTIVE:                                                                                                                                                                                              SUBJECTIVE STATEMENT: Patient reports no new issues. Still having trouble with getting up out of chair.    Eval: Patient presents to therapy with complaint of weakness. She states she had a pneumonia in May and was hospitalized. She had some home health therapy following but would like to do some more aggressive therapy for strength and balance. She does walk some with a walker. She has had therapy in the past for LT sided weakness related to history of CVA.   Pt accompanied by: friend [name], primary care giver)   PERTINENT HISTORY: CVA  PAIN:  Are you having pain? No  PRECAUTIONS: Fall  WEIGHT BEARING RESTRICTIONS No  FALLS: Has patient fallen in last 6 months? No  LIVING ENVIRONMENT: Lives with: lives with an adult companion Lives in: House/apartment Stairs: No (ramp) Has following equipment at home: Environmental consultant - 2 wheeled, Wheelchair (manual), shower chair, and Ramped entry  PLOF: Needs assistance with ADLs  PATIENT GOALS  Being able to stand on my own, be able to walk from living room to kitchen.   OBJECTIVE:   DIAGNOSTIC FINDINGS: NA  COGNITION: Overall cognitive status: History of cognitive impairments - at baseline    LOWER EXTREMITY MMT:    MMT Right Eval Left Eval  Hip flexion 4+ 4  Hip extension 5 4+  Hip abduction    Hip adduction    Hip internal rotation    Hip external rotation    Knee flexion    Knee extension 5 4+  Ankle  dorsiflexion    Ankle plantarflexion    Ankle inversion    Ankle eversion    (Blank rows = not tested)  BED MOBILITY:  Sit to supine Modified independence Supine to sit Modified independence Rolling to Right Modified independence Rolling to Left Modified independence  TRANSFERS: Assistive device utilized: Environmental consultant - 2 wheeled  Sit to stand: Modified independence Stand to sit: Modified independence Chair to chair: Modified independence   GAIT: Gait pattern:  decrease stride, flexed trunk, LT knee flexed Distance walked: 170 feet  Assistive device utilized: Walker - 2 wheeled Level of assistance: Modified independence Comments:   FUNCTIONAL TESTs:  2 minute walk test: 90 feet with RW   TODAY'S TREATMENT:  05/10/22 Seated: Heel/ toe raises x 20 3# LAQ's x 20 3# hip flexion x 20 Scapular retractions 2 x 10  Sidestepping in bars with 3 lb ankle weights  3 RT  Ambulation with RW and CGA with WC following 110 ft  Nustep  (seat 7) lv 1 4 min  9/14/263 STS from mat, standing with RW 2x 5 reps eccentric control. Gait training with RW WC behind CGA x 267ft Supine: Bridge 2x 10 Standing:  heel raises 15x  Sidestep 3RT inside //bars  Minisquat front of WC with HHA 10x Seated hamstring stretch 2x 20" BLE on step  04/30/22 Seated: Heel/ toe raises x 20 3# LAQ's x 20 3# hip flexion x 20 Seated rows RTB x30 Sit to stand  2 x 10 with UE assist   Sidestepping in bars with 3 lb ankle weights  3 RT Heel raises x 10 Marching with 3 ob weights 2 x 10  Ambulation with RW and CGA with WC following 60 ft  PATIENT EDUCATION: Education details: on Eval findings, POC and HEP  Person educated: Patient and Therapist, music Education method: Explanation Education comprehension: verbalized understanding   HOME EXERCISE PROGRAM:  04/16/22 Quad sets  Access Code: [email or web address] URL: [email or web address] Date: 04/11/2022 Prepared by: [facility] - Rehab  Exercises - Seated  March  - 3 x daily - 7 x weekly - 2 sets - 10 reps - Seated Long Arc Quad  - 3 x daily - 7 x weekly - 2 sets - 10 reps - Seated Heel Toe Raises  - 3 x daily - 7 x weekly - 2 sets - 10 reps - Seated Hip Adduction Squeeze with Ball  - 3 x daily - 7 x weekly - 2 sets - 10 reps - Seated Hip Abduction with Resistance  - 3 x daily - 7 x weekly - 2 sets - 10 reps  05/03/22:  Bridge   GOALS: SHORT TERM GOALS: Target date: 04/17/2022  Patient will be independent with initial HEP and self-management strategies to improve functional outcomes Baseline:  Goal status: met  LONG TERM GOALS: Target date: 05/22/2022  Patient will be independent with advanced HEP and self-management strategies to improve functional outcomes Baseline:  Goal status:ongoing  2.  Patient will report at least 65% overall improvement in subjective complaint to indicate improvement in ability to perform ADLs. Baseline:  Goal status: ongoing  3.  Patient will be able to perform stand x 5 in < 30 seconds to demonstrate improvement in functional mobility and reduced risk for falls.  Baseline: 1 rep in 30 sec time  Goal status: ongoing  4. Patient will have equal to or 5/5 MMT throughout LLE to improve ability to perform functional mobility, stair ambulation and ADLs.  Baseline: See MMT Goal  status: ongoing  5. Patient will be able to ambulate at least 200 feet during 2MWT with LRAD to demonstrate improved ability to perform functional mobility and associated tasks. Baseline: 90 feet with RW Goal status: ongoing  ASSESSMENT:  CLINICAL IMPRESSION: Patient showing improved activity tolerance today. Able to increase standing activity an distance ambulated. Added Nustep at end of session for strength and conditioning. Patient requires ongoing verbal cues for LLE placement and coordination for ground clearance with gait and sidestepping in parallel bars. Patient will continue to benefit from skilled therapy services to reduce  remaining deficits and improve functional ability.    OBJECTIVE IMPAIRMENTS Abnormal gait, decreased activity tolerance, decreased balance, decreased endurance, decreased mobility, difficulty walking, decreased strength, and improper body mechanics.   ACTIVITY LIMITATIONS standing, transfers, and locomotion level  PARTICIPATION LIMITATIONS: community activity  PERSONAL FACTORS Age, Past/current experiences, and Time since onset of injury/illness/exacerbation are also affecting patient's functional outcome.   REHAB POTENTIAL: Good  CLINICAL DECISION MAKING: Stable/uncomplicated  EVALUATION COMPLEXITY: Low  PLAN: PT FREQUENCY: 2x/week  PT DURATION: 8 weeks  PLANNED INTERVENTIONS: Therapeutic exercises, Therapeutic activity, Neuromuscular re-education, Balance training, Gait training, Patient/Family education, Joint manipulation, Joint mobilization, Stair training, Aquatic Therapy, Dry Needling, Electrical stimulation, Spinal manipulation, Spinal mobilization, Cryotherapy, Moist heat, scar mobilization, Taping, Traction, Ultrasound, Biofeedback, Ionotophoresis 56m/ml Dexamethasone, and Manual therapy.   PLAN FOR NEXT SESSION: Progress LE and postural strength (seated>standing) then balance and gait as tolerated.  1:05 PM, 05/10/22 CJosue HectorPT DPT  Physical Therapist with [facility] ([phone]

## 2022-05-10 NOTE — Telephone Encounter (Signed)
LVM for pt to rtn my call to schedule AWV with [facility]HA call back # [phone] 

## 2022-05-14 ENCOUNTER — Other Ambulatory Visit: Payer: Self-pay | Admitting: Family Medicine

## 2022-05-15 ENCOUNTER — Other Ambulatory Visit: Payer: Self-pay | Admitting: Family Medicine

## 2022-05-15 ENCOUNTER — Ambulatory Visit (HOSPITAL_COMMUNITY): Payer: Medicare HMO

## 2022-05-15 DIAGNOSIS — M6281 Muscle weakness (generalized): Secondary | ICD-10-CM

## 2022-05-15 DIAGNOSIS — R32 Unspecified urinary incontinence: Secondary | ICD-10-CM

## 2022-05-15 DIAGNOSIS — R2689 Other abnormalities of gait and mobility: Secondary | ICD-10-CM

## 2022-05-15 NOTE — Therapy (Signed)
OUTPATIENT PHYSICAL THERAPY NEURO TREATMENT   Patient Name: Renee [name] MRN: [id number] DOB:03/09/1929, 88 y.o., female Today's Date: 05/15/2022   PCP: Harland Dingwall NP REFERRING PROVIDER: Girtha Rm, NP-C    PT End of Session - 05/15/22 1153     Visit Number 8    Number of Visits 16    Date for PT Re-Evaluation 05/22/22    Authorization Type AETNA Medicare    Progress Note Due on Visit 10    PT Start Time 1118    PT Stop Time 1158    PT Time Calculation (min) 40 min    Activity Tolerance Patient tolerated treatment well    Behavior During Therapy WFL for tasks assessed/performed                  Past Medical History:  Diagnosis Date   Angioedema    possibly from voltaren   Bronchopneumonia 12/11/2016   Degenerative disc disease    Diabetes mellitus    type II   Hyperlipidemia    Hypertension    LVH (left ventricular hypertrophy)    and atrial enlargement by echo in past with nl EF   Nasal pruritis    Osteoarthritis    Osteopenia    Renal insufficiency    Sleep apnea    Stroke ([place]) 05/2010   Small vessel sobcortical (in [facility] trial) with Dr Leonie Man, residual L hemiparesis   Vitamin B 12 deficiency 04/08   Past Surgical History:  Procedure Laterality Date   ABDOMINAL HYSTERECTOMY     BSO-fibroids   APPENDECTOMY     BACK SURGERY     COLON SURGERY     due to punctured intestines   EYE SURGERY     cataract extraction   IR THORACENTESIS ASP PLEURAL SPACE W/IMG GUIDE  06/07/2020   KNEE SURGERY     arthroscope   PARS PLANA VITRECTOMY  07/31/2011   Procedure: PARS PLANA VITRECTOMY WITH 25 GAUGE;  Surgeon: [name], MD;  Location: [facility];  Service: Ophthalmology;  Laterality: Right;  REMOVAL OF SILICONE OIL AND LASER RIGHT EYE   RETINAL DETACHMENT SURGERY  02/18/11   times 2   SPINE SURGERY  08/09   spinal decompression surgery   Patient Active Problem List   Diagnosis Date Noted   History of UTI 02/28/2022   Physical deconditioning  01/19/2022   Chronic respiratory failure with hypoxia ([place]) 01/19/2022   Asthma, chronic, unspecified asthma severity, with acute exacerbation 01/06/2022   Allergic rhinitis 01/06/2022   Chronic diastolic CHF (congestive heart failure) ([place]) 01/06/2022   Obesity (BMI 30-39.9) 01/06/2022   Acute respiratory failure with hypoxia ([place]) 01/05/2022   Bradycardia 09/01/2021   UTI (urinary tract infection) 08/29/2021   Wheezing on expiration 08/29/2021   Sepsis due to pneumonia ([place]) 06/07/2020   Pleural effusion 05/13/2020   Frequent UTI 05/13/2020   Dizziness 02/12/2020   Normocytic anemia 02/12/2020   Fall involving sidewalk curb 11/29/2019   Contusion of back 11/27/2019   Aortic atherosclerosis ([place]) 07/13/2019   CKD (chronic kidney disease) stage 4, GFR 15-29 ml/min ([place]) 07/01/2019   External hemorrhoid 01/17/2018   History of colitis 01/17/2018   Generalized weakness 12/24/2016   Diabetic retinopathy ([place]) 12/11/2016   History of CVA (cerebrovascular accident) 12/11/2016   Weakness secondary to UTI 10/21/2015   Acute renal failure superimposed on stage 3b chronic kidney disease ([place]) 10/12/2015   CAP (community acquired pneumonia) 10/07/2015   Estrogen deficiency 08/30/2015   Mobility impaired  06/26/2011   History of retinal detachment 01/10/2011   Sleep apnea 11/28/2010   Anxiety and depression 08/25/2010   Hemiplegia, late effect of cerebrovascular disease ([place]) 07/05/2010   POSTHERPETIC NEURALGIA 11/09/2009   Renal insufficiency 06/29/2008   Chronic back pain 01/26/2008   EDEMA 01/26/2008   B12 deficiency 01/10/2007   Type 2 diabetes, controlled, with retinopathy ([place]) 11/27/2006   Essential hypertension 11/27/2006   FIBROCYSTIC BREAST DISEASE 11/27/2006   ROSACEA 11/27/2006   OSTEOARTHRITIS 11/27/2006   Urinary incontinence 11/27/2006    ONSET DATE: Chronic   REFERRING DIAG: Z86.73 (ICD-10-CM) - History of CVA (cerebrovascular accident) R53.1 (ICD-10-CM) -  Generalized weakness R53.81 (ICD-10-CM) - Physical deconditioning Z74.09 (ICD-10-CM) - Mobility impaired I50.32 (ICD-10-CM) - Chronic diastolic CHF (congestive heart failure) (HCC)   THERAPY DIAG:  Muscle weakness (generalized)  Other abnormalities of gait and mobility  Rationale for Evaluation and Treatment Rehabilitation  SUBJECTIVE:                                                                                                                                                                                              SUBJECTIVE STATEMENT: No pain today; ears are stopped up per her caregiver [name]   Eval: Patient presents to therapy with complaint of weakness. She states she had a pneumonia in May and was hospitalized. She had some home health therapy following but would like to do some more aggressive therapy for strength and balance. She does walk some with a walker. She has had therapy in the past for LT sided weakness related to history of CVA.   Pt accompanied by: friend [name], primary care giver)   PERTINENT HISTORY: CVA  PAIN:  Are you having pain? No  PRECAUTIONS: Fall  WEIGHT BEARING RESTRICTIONS No  FALLS: Has patient fallen in last 6 months? No  LIVING ENVIRONMENT: Lives with: lives with an adult companion Lives in: House/apartment Stairs: No (ramp) Has following equipment at home: Environmental consultant - 2 wheeled, Wheelchair (manual), shower chair, and Ramped entry  PLOF: Needs assistance with ADLs  PATIENT GOALS  Being able to stand on my own, be able to walk from living room to kitchen.   OBJECTIVE:   DIAGNOSTIC FINDINGS: NA  COGNITION: Overall cognitive status: History of cognitive impairments - at baseline    LOWER EXTREMITY MMT:    MMT Right Eval Left Eval  Hip flexion 4+ 4  Hip extension 5 4+  Hip abduction    Hip adduction    Hip internal rotation    Hip external rotation    Knee flexion    Knee extension 5 4+  Ankle  dorsiflexion    Ankle  plantarflexion    Ankle inversion    Ankle eversion    (Blank rows = not tested)  BED MOBILITY:  Sit to supine Modified independence Supine to sit Modified independence Rolling to Right Modified independence Rolling to Left Modified independence  TRANSFERS: Assistive device utilized: Environmental consultant - 2 wheeled  Sit to stand: Modified independence Stand to sit: Modified independence Chair to chair: Modified independence   GAIT: Gait pattern:  decrease stride, flexed trunk, LT knee flexed Distance walked: 170 feet  Assistive device utilized: Walker - 2 wheeled Level of assistance: Modified independence Comments:   FUNCTIONAL TESTs:  2 minute walk test: 90 feet with RW   TODAY'S TREATMENT:  05/15/22 Seated: Heel/ toe raises x 20 3# LAQ's x 20 3# hip flexion x 20 Hip adduction with ball x 20 Hip abduction with green thera band x 20 Scapular retractions 2 x 10  Standing: // bars Marching x 10 Mini squats x 10 Sidestepping down and back x 1; cga for safety Heel raises x 20 4" box toe taps 2 x 10 each; CGA for safety    Nustep  (seat 7) lv 1 5 min   05/10/22 Seated: Heel/ toe raises x 20 3# LAQ's x 20 3# hip flexion x 20 Scapular retractions 2 x 10  Sidestepping in bars with 3 lb ankle weights  3 RT  Ambulation with RW and CGA with WC following 110 ft  Nustep  (seat 7) lv 1 4 min  9/14/263 STS from mat, standing with RW 2x 5 reps eccentric control. Gait training with RW WC behind CGA x 258ft Supine: Bridge 2x 10 Standing:  heel raises 15x  Sidestep 3RT inside //bars  Minisquat front of WC with HHA 10x Seated hamstring stretch 2x 20" BLE on step  04/30/22 Seated: Heel/ toe raises x 20 3# LAQ's x 20 3# hip flexion x 20 Seated rows RTB x30 Sit to stand  2 x 10 with UE assist   Sidestepping in bars with 3 lb ankle weights  3 RT Heel raises x 10 Marching with 3 ob weights 2 x 10  Ambulation with RW and CGA with WC following 60 ft  PATIENT  EDUCATION: Education details: on Eval findings, POC and HEP  Person educated: Patient and Therapist, music Education method: Explanation Education comprehension: verbalized understanding   HOME EXERCISE PROGRAM:  04/16/22 Quad sets  Access Code: [email or web address] URL: [email or web address] Date: 04/11/2022 Prepared by: [facility] - Rehab  Exercises - Seated March  - 3 x daily - 7 x weekly - 2 sets - 10 reps - Seated Long Arc Quad  - 3 x daily - 7 x weekly - 2 sets - 10 reps - Seated Heel Toe Raises  - 3 x daily - 7 x weekly - 2 sets - 10 reps - Seated Hip Adduction Squeeze with Ball  - 3 x daily - 7 x weekly - 2 sets - 10 reps - Seated Hip Abduction with Resistance  - 3 x daily - 7 x weekly - 2 sets - 10 reps  05/03/22:  Bridge   GOALS: SHORT TERM GOALS: Target date: 04/17/2022  Patient will be independent with initial HEP and self-management strategies to improve functional outcomes Baseline:  Goal status: met  LONG TERM GOALS: Target date: 05/22/2022  Patient will be independent with advanced HEP and self-management strategies to improve functional outcomes Baseline:  Goal status:ongoing  2.  Patient will report at least 65% overall improvement in  subjective complaint to indicate improvement in ability to perform ADLs. Baseline:  Goal status: ongoing  3.  Patient will be able to perform stand x 5 in < 30 seconds to demonstrate improvement in functional mobility and reduced risk for falls.  Baseline: 1 rep in 30 sec time  Goal status: ongoing  4. Patient will have equal to or 5/5 MMT throughout LLE to improve ability to perform functional mobility, stair ambulation and ADLs.  Baseline: See MMT Goal status: ongoing  5. Patient will be able to ambulate at least 200 feet during 2MWT with LRAD to demonstrate improved ability to perform functional mobility and associated tasks. Baseline: 90 feet with RW Goal status: ongoing  ASSESSMENT:  CLINICAL IMPRESSION: Patient  having issues with her hearing aids/ hearing today; needs increased volume for instructions.noted weakness left side with retractions versus right.  Continues with flexed trunk, hip and knees with standing activities and needs frequent cues to improve but sinks into increasing flexed posturing as she fatigues.  Patient will continue to benefit from skilled therapy services to reduce remaining deficits and improve functional ability.    OBJECTIVE IMPAIRMENTS Abnormal gait, decreased activity tolerance, decreased balance, decreased endurance, decreased mobility, difficulty walking, decreased strength, and improper body mechanics.   ACTIVITY LIMITATIONS standing, transfers, and locomotion level  PARTICIPATION LIMITATIONS: community activity  PERSONAL FACTORS Age, Past/current experiences, and Time since onset of injury/illness/exacerbation are also affecting patient's functional outcome.   REHAB POTENTIAL: Good  CLINICAL DECISION MAKING: Stable/uncomplicated  EVALUATION COMPLEXITY: Low  PLAN: PT FREQUENCY: 2x/week  PT DURATION: 8 weeks  PLANNED INTERVENTIONS: Therapeutic exercises, Therapeutic activity, Neuromuscular re-education, Balance training, Gait training, Patient/Family education, Joint manipulation, Joint mobilization, Stair training, Aquatic Therapy, Dry Needling, Electrical stimulation, Spinal manipulation, Spinal mobilization, Cryotherapy, Moist heat, scar mobilization, Taping, Traction, Ultrasound, Biofeedback, Ionotophoresis 26m/ml Dexamethasone, and Manual therapy.   PLAN FOR NEXT SESSION: Progress LE and postural strength  then balance and gait as tolerated.  11:55 AM, 05/15/22 Kirstan Fentress Small Deamonte Sayegh MPT [facility] [place] #[phone]

## 2022-05-16 ENCOUNTER — Ambulatory Visit: Payer: Medicare HMO | Admitting: Urology

## 2022-05-16 ENCOUNTER — Ambulatory Visit (HOSPITAL_COMMUNITY): Payer: Medicare HMO

## 2022-05-16 ENCOUNTER — Telehealth: Payer: Self-pay | Admitting: Family Medicine

## 2022-05-16 DIAGNOSIS — R2689 Other abnormalities of gait and mobility: Secondary | ICD-10-CM

## 2022-05-16 DIAGNOSIS — M6281 Muscle weakness (generalized): Secondary | ICD-10-CM

## 2022-05-16 NOTE — Telephone Encounter (Signed)
Mail box was full, was unable to LVM to schedule AWV with [facility]

## 2022-05-16 NOTE — Therapy (Signed)
OUTPATIENT PHYSICAL THERAPY NEURO PROGRESS NOTE Progress Note Reporting Period 03/27/2022 to 05/16/2022  See note below for Objective Data and Assessment of Progress/Goals.       Patient Name: Renee [name] MRN: [id number] DOB:Dec 03, 1928, 88 y.o., female Today's Date: 05/16/2022   PCP: Harland Dingwall NP REFERRING PROVIDER: Girtha Rm, NP-C    PT End of Session - 05/16/22 1513     Visit Number 9    Number of Visits 24    Date for PT Re-Evaluation 06/19/22    Authorization Type [employer]    Progress Note Due on Visit 19    PT Start Time 1513    PT Stop Time 1555    PT Time Calculation (min) 42 min                   Past Medical History:  Diagnosis Date   Angioedema    possibly from voltaren   Bronchopneumonia 12/11/2016   Degenerative disc disease    Diabetes mellitus    type II   Hyperlipidemia    Hypertension    LVH (left ventricular hypertrophy)    and atrial enlargement by echo in past with nl EF   Nasal pruritis    Osteoarthritis    Osteopenia    Renal insufficiency    Sleep apnea    Stroke ([place]) 05/2010   Small vessel sobcortical (in [facility]) with Dr Leonie Man, residual L hemiparesis   Vitamin B 12 deficiency 04/08   Past Surgical History:  Procedure Laterality Date   ABDOMINAL HYSTERECTOMY     BSO-fibroids   APPENDECTOMY     BACK SURGERY     COLON SURGERY     due to punctured intestines   EYE SURGERY     cataract extraction   IR THORACENTESIS ASP PLEURAL SPACE W/IMG GUIDE  06/07/2020   KNEE SURGERY     arthroscope   PARS PLANA VITRECTOMY  07/31/2011   Procedure: PARS PLANA VITRECTOMY WITH 25 GAUGE;  Surgeon: [name], MD;  Location: [facility];  Service: Ophthalmology;  Laterality: Right;  REMOVAL OF SILICONE OIL AND LASER RIGHT EYE   RETINAL DETACHMENT SURGERY  02/18/11   times 2   SPINE SURGERY  08/09   spinal decompression surgery   Patient Active Problem List   Diagnosis Date Noted   History of UTI 02/28/2022    Physical deconditioning 01/19/2022   Chronic respiratory failure with hypoxia ([place]) 01/19/2022   Asthma, chronic, unspecified asthma severity, with acute exacerbation 01/06/2022   Allergic rhinitis 01/06/2022   Chronic diastolic CHF (congestive heart failure) ([place]) 01/06/2022   Obesity (BMI 30-39.9) 01/06/2022   Acute respiratory failure with hypoxia ([place]) 01/05/2022   Bradycardia 09/01/2021   UTI (urinary tract infection) 08/29/2021   Wheezing on expiration 08/29/2021   Sepsis due to pneumonia ([place]) 06/07/2020   Pleural effusion 05/13/2020   Frequent UTI 05/13/2020   Dizziness 02/12/2020   Normocytic anemia 02/12/2020   Fall involving sidewalk curb 11/29/2019   Contusion of back 11/27/2019   Aortic atherosclerosis ([place]) 07/13/2019   CKD (chronic kidney disease) stage 4, GFR 15-29 ml/min ([place]) 07/01/2019   External hemorrhoid 01/17/2018   History of colitis 01/17/2018   Generalized weakness 12/24/2016   Diabetic retinopathy ([place]) 12/11/2016   History of CVA (cerebrovascular accident) 12/11/2016   Weakness secondary to UTI 10/21/2015   Acute renal failure superimposed on stage 3b chronic kidney disease ([place]) 10/12/2015   CAP (community acquired pneumonia) 10/07/2015   Estrogen deficiency  08/30/2015   Mobility impaired 06/26/2011   History of retinal detachment 01/10/2011   Sleep apnea 11/28/2010   Anxiety and depression 08/25/2010   Hemiplegia, late effect of cerebrovascular disease ([place]) 07/05/2010   POSTHERPETIC NEURALGIA 11/09/2009   Renal insufficiency 06/29/2008   Chronic back pain 01/26/2008   EDEMA 01/26/2008   B12 deficiency 01/10/2007   Type 2 diabetes, controlled, with retinopathy ([place]) 11/27/2006   Essential hypertension 11/27/2006   FIBROCYSTIC BREAST DISEASE 11/27/2006   ROSACEA 11/27/2006   OSTEOARTHRITIS 11/27/2006   Urinary incontinence 11/27/2006    ONSET DATE: Chronic   REFERRING DIAG: Z86.73 (ICD-10-CM) - History of CVA (cerebrovascular accident)  R53.1 (ICD-10-CM) - Generalized weakness R53.81 (ICD-10-CM) - Physical deconditioning Z74.09 (ICD-10-CM) - Mobility impaired I50.32 (ICD-10-CM) - Chronic diastolic CHF (congestive heart failure) (HCC)   THERAPY DIAG:  Muscle weakness (generalized) - Plan: PT plan of care cert/re-cert  Other abnormalities of gait and mobility - Plan: PT plan of care cert/re-cert  Rationale for Evaluation and Treatment Rehabilitation  SUBJECTIVE:                                                                                                                                                                                              SUBJECTIVE STATEMENT: Patient reports no pain; "feeling pretty good"; about "50%" better overall; " I feel stronger"   Eval: Patient presents to therapy with complaint of weakness. She states she had a pneumonia in May and was hospitalized. She had some home health therapy following but would like to do some more aggressive therapy for strength and balance. She does walk some with a walker. She has had therapy in the past for LT sided weakness related to history of CVA.   Pt accompanied by: friend [name], primary care giver)   PERTINENT HISTORY: CVA  PAIN:  Are you having pain? No  PRECAUTIONS: Fall  WEIGHT BEARING RESTRICTIONS No  FALLS: Has patient fallen in last 6 months? No  LIVING ENVIRONMENT: Lives with: lives with an adult companion Lives in: House/apartment Stairs: No (ramp) Has following equipment at home: Environmental consultant - 2 wheeled, Wheelchair (manual), shower chair, and Ramped entry  PLOF: Needs assistance with ADLs  PATIENT GOALS  Being able to stand on my own, be able to walk from living room to kitchen.   OBJECTIVE:   DIAGNOSTIC FINDINGS: NA  COGNITION: Overall cognitive status: History of cognitive impairments - at baseline    LOWER EXTREMITY MMT:    MMT Right Eval Left Eval Right 05/16/22 Left 05/16/22  Hip flexion 4+ 4 4+ 4-  Hip extension 5  4+    Hip  abduction      Hip adduction      Hip internal rotation      Hip external rotation      Knee flexion      Knee extension 5 4+ 5 5  Ankle dorsiflexion      Ankle plantarflexion      Ankle inversion      Ankle eversion      (Blank rows = not tested)  BED MOBILITY:  Sit to supine Modified independence Supine to sit Modified independence Rolling to Right Modified independence Rolling to Left Modified independence  TRANSFERS: Assistive device utilized: Environmental consultant - 2 wheeled  Sit to stand: Modified independence Stand to sit: Modified independence Chair to chair: Modified independence   GAIT: Gait pattern:  decrease stride, flexed trunk, LT knee flexed Distance walked: 170 feet  Assistive device utilized: Walker - 2 wheeled Level of assistance: Modified independence Comments:   FUNCTIONAL TESTs:  2 minute walk test: 90 feet with RW   TODAY'S TREATMENT:  05/16/22 Progress note 30 sec sit to stand x 2 2 MWT 73 ft   standing // bars Heel raises x 15 Marching 2 x 10 4" box toe taps x 15  Hip abduction x 10 each 4" step ups Right x 8, left x 4 Sidestepping // bars down and back x 2 with CGA  Sit to stand from chair 2 x 5     05/15/22 Seated: Heel/ toe raises x 20 3# LAQ's x 20 3# hip flexion x 20 Hip adduction with ball x 20 Hip abduction with green thera band x 20 Scapular retractions 2 x 10  Standing: // bars Marching x 10 Mini squats x 10 Sidestepping down and back x 1; cga for safety Heel raises x 20 4" box toe taps 2 x 10 each; CGA for safety    Nustep  (seat 7) lv 1 5 min   05/10/22 Seated: Heel/ toe raises x 20 3# LAQ's x 20 3# hip flexion x 20 Scapular retractions 2 x 10  Sidestepping in bars with 3 lb ankle weights  3 RT  Ambulation with RW and CGA with WC following 110 ft  Nustep  (seat 7) lv 1 4 min  9/14/263 STS from mat, standing with RW 2x 5 reps eccentric control. Gait training with RW WC behind CGA x 228f Supine:  Bridge 2x 10 Standing:  heel raises 15x  Sidestep 3RT inside //bars  Minisquat front of WC with HHA 10x Seated hamstring stretch 2x 20" BLE on step  04/30/22 Seated: Heel/ toe raises x 20 3# LAQ's x 20 3# hip flexion x 20 Seated rows RTB x30 Sit to stand  2 x 10 with UE assist   Sidestepping in bars with 3 lb ankle weights  3 RT Heel raises x 10 Marching with 3 ob weights 2 x 10  Ambulation with RW and CGA with WC following 60 ft  PATIENT EDUCATION: Education details: on Eval findings, POC and HEP  Person educated: Patient and CTherapist, musicEducation method: Explanation Education comprehension: verbalized understanding   HOME EXERCISE PROGRAM:  04/16/22 Quad sets  Access Code: [email or web address] URL: [email or web address] Date: 04/11/2022 Prepared by: [facility] - Rehab  Exercises - Seated March  - 3 x daily - 7 x weekly - 2 sets - 10 reps - Seated Long Arc Quad  - 3 x daily - 7 x weekly - 2 sets - 10 reps - Seated Heel Toe Raises  - 3 x daily - 7  x weekly - 2 sets - 10 reps - Seated Hip Adduction Squeeze with Ball  - 3 x daily - 7 x weekly - 2 sets - 10 reps - Seated Hip Abduction with Resistance  - 3 x daily - 7 x weekly - 2 sets - 10 reps  05/03/22:  Bridge   GOALS: SHORT TERM GOALS: Target date: 04/17/2022  Patient will be independent with initial HEP and self-management strategies to improve functional outcomes Baseline:  Goal status: met  LONG TERM GOALS: Target date: 05/22/2022  Patient will be independent with advanced HEP and self-management strategies to improve functional outcomes Baseline:  Goal status:ongoing  2.  Patient will report at least 65% overall improvement in subjective complaint to indicate improvement in ability to perform ADLs. Baseline: 05/16/22 "50%" better Goal status: ongoing  3.  Patient will be able to perform stand x 5 in < 30 seconds to demonstrate improvement in functional mobility and reduced risk for falls.  Baseline: 1 rep  in 30 sec time ; 05/16/22 2 x in 30 sec Goal status: ongoing  4. Patient will have equal to or 5/5 MMT throughout LLE to improve ability to perform functional mobility, stair ambulation and ADLs.  Baseline: See MMT Goal status: ongoing  5. Patient will be able to ambulate at least 200 feet during 2MWT with LRAD to demonstrate improved ability to perform functional mobility and associated tasks. Baseline: 90 feet with RW; 05/16/22 73 ft Goal status: ongoing  ASSESSMENT:  CLINICAL IMPRESSION: Progress note today. Patient continues with forward flexed posturing.  Trial of holding with 1 hand in // bars for toe taps but patient unable to safely balance.  Still HOH and needs increased volume and occasional lip read to understand instruction.Patient made improvement with strength but still needs work on balance and ambulation.    Patient will continue to benefit from continued skilled therapy services to reduce remaining deficits and improve functional ability and to meet remaining unmet and partially met goals.    OBJECTIVE IMPAIRMENTS Abnormal gait, decreased activity tolerance, decreased balance, decreased endurance, decreased mobility, difficulty walking, decreased strength, and improper body mechanics.   ACTIVITY LIMITATIONS standing, transfers, and locomotion level  PARTICIPATION LIMITATIONS: community activity  PERSONAL FACTORS Age, Past/current experiences, and Time since onset of injury/illness/exacerbation are also affecting patient's functional outcome.   REHAB POTENTIAL: Good  CLINICAL DECISION MAKING: Stable/uncomplicated  EVALUATION COMPLEXITY: Low  PLAN: PT FREQUENCY: 2x/week  PT DURATION: 4 weeks  PLANNED INTERVENTIONS: Therapeutic exercises, Therapeutic activity, Neuromuscular re-education, Balance training, Gait training, Patient/Family education, Joint manipulation, Joint mobilization, Stair training, Aquatic Therapy, Dry Needling, Electrical stimulation, Spinal  manipulation, Spinal mobilization, Cryotherapy, Moist heat, scar mobilization, Taping, Traction, Ultrasound, Biofeedback, Ionotophoresis 43m/ml Dexamethasone, and Manual therapy.   PLAN FOR NEXT SESSION: recommend extend PT 2 week 4 to address remaining unmet and partially met goals. Balance gait and strengthening  4:07 PM, 05/16/22 Lateia Fraser Small Aminta Sakurai MPT [facility] [place] #[phone]

## 2022-05-16 NOTE — Progress Notes (Deleted)
Assessment: 1. History of UTI   2. Urinary incontinence, unspecified type     Plan: Urine culture sent today.  We will contact her with results. Continue methods to reduce the risk of UTIs discussed including increased fluid intake, timed and double voiding, daily cranberry supplement, and daily probiotics. Continue solifenacin 10 mg daily Return to office in 6 weeks  Chief Complaint:  No chief complaint on file.   History of Present Illness:  Renee [name] is a 88 y.o. year old female who is seen for further evaluation of frequent UTIs.  She has a history of frequent urinary tract infections.  She does not have typical UTI symptoms.  Her UTIs typically present with generalized malaise and confusion.  No dysuria or gross hematuria.  No flank pain, fever, or chills.  Urine culture results: 1/22 mixed flora 2/22 Klebsiella 3/22 <10K colonies 4/22 Citrobacter 5/22 no growth 7/22 multiple species 9/22 E. coli  She has been on daily cephalexin for >1-year for UTI prevention. She was not having any UTI symptoms at her visit in July 2023. She has urinary incontinence.  She has been managed with Vesicare 10 mg daily.  Her daytime symptoms are fairly well controlled.  She does have incontinence at night.  She has previously seen Dr. Abner Greenspan at [facility].  The daily cephalexin was discontinued in July 2023. At her visit in August 2023, she had been off the cephalexin for 1 month.  She was not having any UTI symptoms.  No dysuria or gross hematuria.  She  noted a strong odor to her urine.  No fevers or chills.  She continued on Vesicare 10 mg daily.  Her incontinence symptoms were unchanged. Urine culture from 04/06/2022 grew >100 K E. coli.  She was treated with cefdinir.   Portions of the above documentation were copied from a prior visit for review purposes only.   Past Medical History:  Past Medical History:  Diagnosis Date   Angioedema    possibly from voltaren    Bronchopneumonia 12/11/2016   Degenerative disc disease    Diabetes mellitus    type II   Hyperlipidemia    Hypertension    LVH (left ventricular hypertrophy)    and atrial enlargement by echo in past with nl EF   Nasal pruritis    Osteoarthritis    Osteopenia    Renal insufficiency    Sleep apnea    Stroke ([place]) 05/2010   Small vessel sobcortical (in [facility]) with Dr Leonie Man, residual L hemiparesis   Vitamin B 12 deficiency 04/08    Past Surgical History:  Past Surgical History:  Procedure Laterality Date   ABDOMINAL HYSTERECTOMY     BSO-fibroids   APPENDECTOMY     BACK SURGERY     COLON SURGERY     due to punctured intestines   EYE SURGERY     cataract extraction   IR THORACENTESIS ASP PLEURAL SPACE W/IMG GUIDE  06/07/2020   KNEE SURGERY     arthroscope   PARS PLANA VITRECTOMY  07/31/2011   Procedure: PARS PLANA VITRECTOMY WITH 25 GAUGE;  Surgeon: [name], MD;  Location: [facility];  Service: Ophthalmology;  Laterality: Right;  REMOVAL OF SILICONE OIL AND LASER RIGHT EYE   RETINAL DETACHMENT SURGERY  02/18/11   times 2   SPINE SURGERY  08/09   spinal decompression surgery    Allergies:  Allergies  Allergen Reactions   Tape Other (See Comments)    The  patient's skin is VERY THIN- will TEAR EASILY!!   Voltaren [Diclofenac] Swelling and Other (See Comments)    Angioedema    Family History:  Family History  Problem Relation Age of Onset   COPD Brother    Cancer Sister        brain tumor with hemmorhage   Heart disease Sister        CAD    Social History:  Social History   Tobacco Use   Smoking status: Never    Passive exposure: Never   Smokeless tobacco: Never  Substance Use Topics   Alcohol use: No    Alcohol/week: 0.0 standard drinks of alcohol   Drug use: No    ROS: Constitutional:  Negative for fever, chills, weight loss CV: Negative for chest pain, previous MI, hypertension Respiratory:  Negative for shortness of breath, wheezing, sleep  apnea, frequent cough GI:  Negative for nausea, vomiting, bloody stool, GERD  Physical exam: There were no vitals taken for this visit. GENERAL APPEARANCE:  Well appearing, well developed, well nourished, NAD HEENT:  Atraumatic, normocephalic, oropharynx clear NECK:  Supple without lymphadenopathy or thyromegaly ABDOMEN:  Soft, non-tender, no masses EXTREMITIES:  Moves all extremities well, without clubbing, cyanosis, or edema NEUROLOGIC:  Alert and oriented x 3, normal gait, CN II-XII grossly intact MENTAL STATUS:  appropriate BACK:  Non-tender to palpation, No CVAT SKIN:  Warm, dry, and intact  Results: U/A:

## 2022-05-19 DIAGNOSIS — A403 Sepsis due to Streptococcus pneumoniae: Secondary | ICD-10-CM | POA: Diagnosis not present

## 2022-05-23 NOTE — Chronic Care Management (AMB) (Signed)
  Care Coordination  Outreach Note  05/23/2022 Name: Renee [name] MRN: [id number] DOB: Oct 16, 1928   Care Coordination Outreach Attempts: A second unsuccessful outreach was attempted today to offer the patient with information about available care coordination services as a benefit of their health plan.     Follow Up Plan:  Additional outreach attempts will be made to offer the patient care coordination information and services.   Encounter Outcome:  No Answer  Julian Hy, [place] Direct Dial: [phone]

## 2022-05-29 ENCOUNTER — Ambulatory Visit (HOSPITAL_COMMUNITY): Payer: Medicare HMO | Attending: Family Medicine

## 2022-05-29 ENCOUNTER — Encounter (HOSPITAL_COMMUNITY): Payer: Self-pay

## 2022-05-29 DIAGNOSIS — M6281 Muscle weakness (generalized): Secondary | ICD-10-CM | POA: Insufficient documentation

## 2022-05-29 DIAGNOSIS — R2689 Other abnormalities of gait and mobility: Secondary | ICD-10-CM | POA: Insufficient documentation

## 2022-05-29 NOTE — Therapy (Signed)
OUTPATIENT PHYSICAL THERAPY NEURO THERAPY   Patient Name: Renee [name] MRN: [id number] DOB:08/14/1929, 88 y.o., female Today's Date: 05/29/2022   PCP: Harland Dingwall NP REFERRING PROVIDER: Girtha Rm, NP-C    PT End of Session - 05/29/22 1526     Visit Number 10    Number of Visits 24    Date for PT Re-Evaluation 06/19/22    Authorization Type [employer] Medicare    Progress Note Due on Visit 73    PT Start Time 1520    PT Stop Time 1559    PT Time Calculation (min) 39 min    Activity Tolerance Patient tolerated treatment well;Patient limited by fatigue;No increased pain    Behavior During Therapy WFL for tasks assessed/performed                    Past Medical History:  Diagnosis Date   Angioedema    possibly from voltaren   Bronchopneumonia 12/11/2016   Degenerative disc disease    Diabetes mellitus    type II   Hyperlipidemia    Hypertension    LVH (left ventricular hypertrophy)    and atrial enlargement by echo in past with nl EF   Nasal pruritis    Osteoarthritis    Osteopenia    Renal insufficiency    Sleep apnea    Stroke ([place]) 05/2010   Small vessel sobcortical (in [place]) with Dr Leonie Man, residual L hemiparesis   Vitamin B 12 deficiency 04/08   Past Surgical History:  Procedure Laterality Date   ABDOMINAL HYSTERECTOMY     BSO-fibroids   APPENDECTOMY     BACK SURGERY     COLON SURGERY     due to punctured intestines   EYE SURGERY     cataract extraction   IR THORACENTESIS ASP PLEURAL SPACE W/IMG GUIDE  06/07/2020   KNEE SURGERY     arthroscope   PARS PLANA VITRECTOMY  07/31/2011   Procedure: PARS PLANA VITRECTOMY WITH 25 GAUGE;  Surgeon: [name], MD;  Location: [facility];  Service: Ophthalmology;  Laterality: Right;  REMOVAL OF SILICONE OIL AND LASER RIGHT EYE   RETINAL DETACHMENT SURGERY  02/18/11   times 2   SPINE SURGERY  08/09   spinal decompression surgery   Patient Active Problem List   Diagnosis Date Noted    History of UTI 02/28/2022   Physical deconditioning 01/19/2022   Chronic respiratory failure with hypoxia ([place]) 01/19/2022   Asthma, chronic, unspecified asthma severity, with acute exacerbation 01/06/2022   Allergic rhinitis 01/06/2022   Chronic diastolic CHF (congestive heart failure) ([place]) 01/06/2022   Obesity (BMI 30-39.9) 01/06/2022   Acute respiratory failure with hypoxia ([place]) 01/05/2022   Bradycardia 09/01/2021   UTI (urinary tract infection) 08/29/2021   Wheezing on expiration 08/29/2021   Sepsis due to pneumonia ([place]) 06/07/2020   Pleural effusion 05/13/2020   Frequent UTI 05/13/2020   Dizziness 02/12/2020   Normocytic anemia 02/12/2020   Fall involving sidewalk curb 11/29/2019   Contusion of back 11/27/2019   Aortic atherosclerosis ([place]) 07/13/2019   CKD (chronic kidney disease) stage 4, GFR 15-29 ml/min ([place]) 07/01/2019   External hemorrhoid 01/17/2018   History of colitis 01/17/2018   Generalized weakness 12/24/2016   Diabetic retinopathy ([place]) 12/11/2016   History of CVA (cerebrovascular accident) 12/11/2016   Weakness secondary to UTI 10/21/2015   Acute renal failure superimposed on stage 3b chronic kidney disease ([place]) 10/12/2015   CAP (community acquired pneumonia) 10/07/2015  Estrogen deficiency 08/30/2015   Mobility impaired 06/26/2011   History of retinal detachment 01/10/2011   Sleep apnea 11/28/2010   Anxiety and depression 08/25/2010   Hemiplegia, late effect of cerebrovascular disease ([place]) 07/05/2010   POSTHERPETIC NEURALGIA 11/09/2009   Renal insufficiency 06/29/2008   Chronic back pain 01/26/2008   EDEMA 01/26/2008   B12 deficiency 01/10/2007   Type 2 diabetes, controlled, with retinopathy ([place]) 11/27/2006   Essential hypertension 11/27/2006   FIBROCYSTIC BREAST DISEASE 11/27/2006   ROSACEA 11/27/2006   OSTEOARTHRITIS 11/27/2006   Urinary incontinence 11/27/2006    ONSET DATE: Chronic   REFERRING DIAG: Z86.73 (ICD-10-CM) - History of CVA  (cerebrovascular accident) R53.1 (ICD-10-CM) - Generalized weakness R53.81 (ICD-10-CM) - Physical deconditioning Z74.09 (ICD-10-CM) - Mobility impaired I50.32 (ICD-10-CM) - Chronic diastolic CHF (congestive heart failure) (HCC)   THERAPY DIAG:  Muscle weakness (generalized)  Other abnormalities of gait and mobility  Rationale for Evaluation and Treatment Rehabilitation  SUBJECTIVE:                                                                                                                                                                                              SUBJECTIVE STATEMENT: Reports she is feeling good, arrived with caregiver who sat through session.  No reports of pain or recent falls.     Eval: Patient presents to therapy with complaint of weakness. She states she had a pneumonia in May and was hospitalized. She had some home health therapy following but would like to do some more aggressive therapy for strength and balance. She does walk some with a walker. She has had therapy in the past for LT sided weakness related to history of CVA.   Pt accompanied by: friend [name], primary care giver)   PERTINENT HISTORY: CVA  PAIN:  Are you having pain? No  PRECAUTIONS: Fall  WEIGHT BEARING RESTRICTIONS No  FALLS: Has patient fallen in last 6 months? No  LIVING ENVIRONMENT: Lives with: lives with an adult companion Lives in: House/apartment Stairs: No (ramp) Has following equipment at home: Environmental consultant - 2 wheeled, Wheelchair (manual), shower chair, and Ramped entry  PLOF: Needs assistance with ADLs  PATIENT GOALS  Being able to stand on my own, be able to walk from living room to kitchen.   OBJECTIVE:   DIAGNOSTIC FINDINGS: NA  COGNITION: Overall cognitive status: History of cognitive impairments - at baseline    LOWER EXTREMITY MMT:    MMT Right Eval Left Eval Right 05/16/22 Left 05/16/22  Hip flexion 4+ 4 4+ 4-  Hip extension 5 4+    Hip abduction  Hip adduction      Hip internal rotation      Hip external rotation      Knee flexion      Knee extension 5 4+ 5 5  Ankle dorsiflexion      Ankle plantarflexion      Ankle inversion      Ankle eversion      (Blank rows = not tested)  BED MOBILITY:  Sit to supine Modified independence Supine to sit Modified independence Rolling to Right Modified independence Rolling to Left Modified independence  TRANSFERS: Assistive device utilized: Environmental consultant - 2 wheeled  Sit to stand: Modified independence Stand to sit: Modified independence Chair to chair: Modified independence   GAIT: Gait pattern:  decrease stride, flexed trunk, LT knee flexed Distance walked: 170 feet  Assistive device utilized: Walker - 2 wheeled Level of assistance: Modified independence Comments:   FUNCTIONAL TESTs:  2 minute walk test: 90 feet with RW   TODAY'S TREATMENT:  10/10/263 Heel raises x 15 Marching 2 x 10 Toe tapping alternating with HHA 6in step 2 x 10  Hip extension 10x Hip abduction x 10 each 2 sets Squats with HHA 2x 10 Sidestepping // bars down and back x 2 with CGA  Sit to stand from chair 2 x 5    05/16/22 Progress note 30 sec sit to stand x 2 2 MWT 73 ft   standing // bars Heel raises x 15 Marching 2 x 10 4" box toe taps x 15  Hip abduction x 10 each 4" step ups Right x 8, left x 4 Sidestepping // bars down and back x 2 with CGA  Sit to stand from chair 2 x 5     05/15/22 Seated: Heel/ toe raises x 20 3# LAQ's x 20 3# hip flexion x 20 Hip adduction with ball x 20 Hip abduction with green thera band x 20 Scapular retractions 2 x 10  Standing: // bars Marching x 10 Mini squats x 10 Sidestepping down and back x 1; cga for safety Heel raises x 20 4" box toe taps 2 x 10 each; CGA for safety    Nustep  (seat 7) lv 1 5 min   05/10/22 Seated: Heel/ toe raises x 20 3# LAQ's x 20 3# hip flexion x 20 Scapular retractions 2 x 10  Sidestepping in bars with 3 lb  ankle weights  3 RT  Ambulation with RW and CGA with WC following 110 ft  Nustep  (seat 7) lv 1 4 min  9/14/263 STS from mat, standing with RW 2x 5 reps eccentric control. Gait training with RW WC behind CGA x 242f Supine: Bridge 2x 10 Standing:  heel raises 15x  Sidestep 3RT inside //bars  Minisquat front of WC with HHA 10x Seated hamstring stretch 2x 20" BLE on step  04/30/22 Seated: Heel/ toe raises x 20 3# LAQ's x 20 3# hip flexion x 20 Seated rows RTB x30 Sit to stand  2 x 10 with UE assist   Sidestepping in bars with 3 lb ankle weights  3 RT Heel raises x 10 Marching with 3 ob weights 2 x 10  Ambulation with RW and CGA with WC following 60 ft  PATIENT EDUCATION: Education details: on Eval findings, POC and HEP  Person educated: Patient and CTherapist, musicEducation method: Explanation Education comprehension: verbalized understanding   HOME EXERCISE PROGRAM:  04/16/22 Quad sets  Access Code: [email or web address] URL: [email or web address] Date: 04/11/2022 Prepared by: [facility] - Rehab  Exercises - Seated March  - 3 x daily - 7 x weekly - 2 sets - 10 reps - Seated Long Arc Quad  - 3 x daily - 7 x weekly - 2 sets - 10 reps - Seated Heel Toe Raises  - 3 x daily - 7 x weekly - 2 sets - 10 reps - Seated Hip Adduction Squeeze with Ball  - 3 x daily - 7 x weekly - 2 sets - 10 reps - Seated Hip Abduction with Resistance  - 3 x daily - 7 x weekly - 2 sets - 10 reps  05/03/22:  Bridge   GOALS: SHORT TERM GOALS: Target date: 04/17/2022  Patient will be independent with initial HEP and self-management strategies to improve functional outcomes Baseline:  Goal status: met  LONG TERM GOALS: Target date: 05/22/2022  Patient will be independent with advanced HEP and self-management strategies to improve functional outcomes Baseline:  Goal status:ongoing  2.  Patient will report at least 65% overall improvement in subjective complaint to indicate improvement in  ability to perform ADLs. Baseline: 05/16/22 "50%" better Goal status: ongoing  3.  Patient will be able to perform stand x 5 in < 30 seconds to demonstrate improvement in functional mobility and reduced risk for falls.  Baseline: 1 rep in 30 sec time ; 05/16/22 2 x in 30 sec Goal status: ongoing  4. Patient will have equal to or 5/5 MMT throughout LLE to improve ability to perform functional mobility, stair ambulation and ADLs.  Baseline: See MMT Goal status: ongoing  5. Patient will be able to ambulate at least 200 feet during 2MWT with LRAD to demonstrate improved ability to perform functional mobility and associated tasks. Baseline: 90 feet with RW; 05/16/22 73 ft Goal status: ongoing  ASSESSMENT:  CLINICAL IMPRESSION: Session focus with functional strengthening and balance training.  Pt tolerated well with session but was easily fatigued and required seated rest breaks.  Cueing required for safety to feel chair behind legs and to reach with arms.  Noted brakes not good with WC, encouraged care giver to have them looked at for safety.  Pt continues to stand with forward flexed posture and difficult to extend knees to assist during weight bearing activities.     OBJECTIVE IMPAIRMENTS Abnormal gait, decreased activity tolerance, decreased balance, decreased endurance, decreased mobility, difficulty walking, decreased strength, and improper body mechanics.   ACTIVITY LIMITATIONS standing, transfers, and locomotion level  PARTICIPATION LIMITATIONS: community activity  PERSONAL FACTORS Age, Past/current experiences, and Time since onset of injury/illness/exacerbation are also affecting patient's functional outcome.   REHAB POTENTIAL: Good  CLINICAL DECISION MAKING: Stable/uncomplicated  EVALUATION COMPLEXITY: Low  PLAN: PT FREQUENCY: 2x/week  PT DURATION: 4 weeks  PLANNED INTERVENTIONS: Therapeutic exercises, Therapeutic activity, Neuromuscular re-education, Balance training, Gait  training, Patient/Family education, Joint manipulation, Joint mobilization, Stair training, Aquatic Therapy, Dry Needling, Electrical stimulation, Spinal manipulation, Spinal mobilization, Cryotherapy, Moist heat, scar mobilization, Taping, Traction, Ultrasound, Biofeedback, Ionotophoresis 55m/ml Dexamethasone, and Manual therapy.   PLAN FOR NEXT SESSION: recommend extend PT 2 week 4 to address remaining unmet and partially met goals. Balance gait and strengthening  CIhor Austin LPTA/CLT; CBIS [phone] 5:13 PM, 05/29/22

## 2022-05-29 NOTE — Chronic Care Management (AMB) (Signed)
  Care Coordination   Note   05/29/2022 Name: Renee [name] MRN: [id number] DOB: [birth year]  Renee [name] is a 88 y.o. year old female who sees [place], [place] L, NP-C for primary care. I reached out to [employer] by phone today to offer care coordination services.  Ms. Dao was given information about Care Coordination services today including:   The Care Coordination services include support from the care team which includes your Nurse Coordinator, Clinical Social Worker, or Pharmacist.  The Care Coordination team is here to help remove barriers to the health concerns and goals most important to you. Care Coordination services are voluntary, and the patient may decline or stop services at any time by request to their care team member.   Care Coordination Consent Status: Patient agreed to services and verbal consent obtained.   Follow up plan:  Telephone appointment with care coordination team member scheduled for:  06/11/2022  Encounter Outcome:  Pt. Scheduled from referral   Julian Hy, [place] Direct Dial: [phone]

## 2022-05-30 ENCOUNTER — Ambulatory Visit (INDEPENDENT_AMBULATORY_CARE_PROVIDER_SITE_OTHER): Payer: Medicare HMO

## 2022-05-30 VITALS — BP 126/60 | HR 65 | Temp 97.5°F | Ht 63.0 in

## 2022-05-30 DIAGNOSIS — Z23 Encounter for immunization: Secondary | ICD-10-CM

## 2022-05-30 DIAGNOSIS — Z0000 Encounter for general adult medical examination without abnormal findings: Secondary | ICD-10-CM

## 2022-05-30 NOTE — Progress Notes (Signed)
Subjective:   Renee [name] is a 88 y.o. female who presents for Medicare Annual (Subsequent) preventive examination.  Review of Systems    No ROS. Medicare Wellness Visit. Additional risk factors are reflected in social history. Cardiac Risk Factors include: advanced age (>50mn, >>68women);sedentary lifestyle;obesity (BMI >30kg/m2);diabetes mellitus     Objective:    Today's Vitals   05/30/22 1349  BP: 126/60  Pulse: 65  Temp: (!) 97.5 F (36.4 C)  TempSrc: Temporal  SpO2: 98%  Height: '5\' 3"'$  (1.6 m)   Body mass index is 30.65 kg/m.     05/30/2022    4:03 PM 01/05/2022   10:39 PM 08/30/2021   11:00 AM 07/10/2021    2:45 PM 06/23/2021    3:08 PM 05/24/2021    3:01 PM 01/13/2021   12:22 PM  Advanced Directives  Does Patient Have a Medical Advance Directive? Yes Yes Yes Yes No No Yes  Type of AParamedicof [place] V[place] will [place] will Living will [place] Dor[place]Living will   [place]Living will  Does patient want to make changes to medical advance directive? No - Patient declined No - Patient declined No - Patient declined No - Patient declined   No - Patient declined  Copy of H[place]in Chart? No - copy requested Yes - validated most recent copy scanned in chart (See row information)  No - copy requested   Yes - validated most recent copy scanned in chart (See row information)  Would patient like information on creating a medical advance directive?    No - Patient declined No - Patient declined      Current Medications (verified) Outpatient Encounter Medications as of 05/30/2022  Medication Sig   acetaminophen (TYLENOL) 500 MG tablet Take 1,000 mg by mouth at bedtime.   albuterol (PROVENTIL) (2.5 MG/3ML) 0.083% nebulizer solution Take 3 mLs (2.5 mg total) by nebulization every 6 (six) hours as needed for wheezing or shortness of breath.   albuterol (VENTOLIN  HFA) 108 (90 Base) MCG/ACT inhaler INHALE 2 PUFFS INTO THE LUNGS IN THE MORNING, AT NOON, AND AT BEDTIME.   amLODipine (NORVASC) 10 MG tablet TAKE 1 TABLET BY MOUTH EVERY DAY (Patient taking differently: Take 10 mg by mouth daily.)   aspirin EC 325 MG tablet Take 325 mg by mouth in the morning.   azithromycin (ZITHROMAX) 250 MG tablet Take 1 tablet (250 mg total) by mouth daily.   benzonatate (TESSALON) 200 MG capsule Take 1 capsule (200 mg total) by mouth 2 (two) times daily as needed for cough.   budesonide-formoterol (SYMBICORT) 160-4.5 MCG/ACT inhaler Inhale 2 puffs into the lungs 2 (two) times daily.   calcium-vitamin D (OSCAL WITH D) 500-200 MG-UNIT per tablet Take 1 tablet by mouth daily.   Cholecalciferol (VITAMIN D-3) 25 MCG (1000 UT) CAPS Take 1,000 Units by mouth in the morning.   Cranberry 500 MG TABS Take 500 mg by mouth in the morning and at bedtime.   CRANBERRY PO Take by mouth.   docusate sodium (COLACE) 100 MG capsule Take 200 mg by mouth at bedtime.   estradiol (ESTRACE) 0.1 MG/GM vaginal cream Place 1 g vaginally daily.   FLONASE 50 MCG/ACT nasal spray Place 2 sprays into both nostrils See admin instructions. Instill 2 sprays into each nostril at bedtime   furosemide (LASIX) 20 MG tablet Take 1 tablet (20 mg total) by mouth daily.   glipiZIDE (GLUCOTROL XL)  5 MG 24 hr tablet Take 1 tablet (5 mg total) by mouth daily with breakfast.   glucose blood (ONETOUCH ULTRA) test strip CHECK BLOOD SUGAR ONCE DAILY AND AS DIRECTED.   hydrALAZINE (APRESOLINE) 25 MG tablet Take 1 tablet (25 mg total) by mouth every 8 (eight) hours.   hydrochlorothiazide (MICROZIDE) 12.5 MG capsule Take 1 capsule (12.5 mg total) by mouth daily.   Lancets (ONETOUCH DELICA PLUS [id number]) MISC CHECK BLOOD SUGAR ONCE DAILY AND AS DIRECTED   loratadine (CLARITIN) 10 MG tablet Take 1 tablet (10 mg total) by mouth daily.   metoprolol succinate (TOPROL-XL) 25 MG 24 hr tablet Take 12.5 mg by mouth 2 (two) times  daily.   mirtazapine (REMERON) 15 MG tablet TAKE 1 TABLET BY MOUTH EVERYDAY AT BEDTIME   Multiple Vitamin (MULTIVITAMIN) capsule Take 1 capsule by mouth daily.   Multiple Vitamins-Minerals (PRESERVISION AREDS 2+MULTI VIT) CAPS Take 1 capsule by mouth in the morning and at bedtime.   Olopatadine HCl (PATADAY OP) Place 1 drop into both eyes 2 (two) times daily as needed (for itching or redness).   pantoprazole (PROTONIX) 40 MG tablet Take 1 tablet (40 mg total) by mouth daily at 6 (six) AM.   pioglitazone (ACTOS) 30 MG tablet Take 1 tablet (30 mg total) by mouth daily.   sertraline (ZOLOFT) 50 MG tablet Take 1 tablet (50 mg total) by mouth daily.   sitaGLIPtin (JANUVIA) 100 MG tablet Take 1 tablet (100 mg total) by mouth daily.   solifenacin (VESICARE) 10 MG tablet TAKE 1 TABLET BY MOUTH EVERY DAY   Spacer/Aero-Holding Chambers DEVI 1 each by Does not apply route at bedtime. Use with inhaler.   trandolapril (MAVIK) 4 MG tablet TAKE 1 TABLET DAILY (Patient taking differently: Take 4 mg by mouth daily.)   triamcinolone (NASACORT) 55 MCG/ACT AERO nasal inhaler Place 2 sprays into the nose daily. (Patient taking differently: Place 2 sprays into the nose See admin instructions. Instill 2 sprays into each nostril at bedtime when not using Flonase)   vitamin B-12 (CYANOCOBALAMIN) 1000 MCG tablet Take 1,000 mcg by mouth daily.    No facility-administered encounter medications on file as of 05/30/2022.    Allergies (verified) Tape and Voltaren [diclofenac]   History: Past Medical History:  Diagnosis Date   Angioedema    possibly from voltaren   Bronchopneumonia 12/11/2016   Degenerative disc disease    Diabetes mellitus    type II   Hyperlipidemia    Hypertension    LVH (left ventricular hypertrophy)    and atrial enlargement by echo in past with nl EF   Nasal pruritis    Osteoarthritis    Osteopenia    Renal insufficiency    Sleep apnea    Stroke ([place]) 05/2010   Small vessel sobcortical  (in [place] trial) with Dr Leonie Man, residual L hemiparesis   Vitamin B 12 deficiency 04/08   Past Surgical History:  Procedure Laterality Date   ABDOMINAL HYSTERECTOMY     BSO-fibroids   APPENDECTOMY     BACK SURGERY     COLON SURGERY     due to punctured intestines   EYE SURGERY     cataract extraction   IR THORACENTESIS ASP PLEURAL SPACE W/IMG GUIDE  06/07/2020   KNEE SURGERY     arthroscope   PARS PLANA VITRECTOMY  07/31/2011   Procedure: PARS PLANA VITRECTOMY WITH 25 GAUGE;  Surgeon: [name], MD;  Location: [facility];  Service: Ophthalmology;  Laterality: Right;  REMOVAL OF SILICONE OIL AND LASER RIGHT EYE   RETINAL DETACHMENT SURGERY  02/18/11   times 2   SPINE SURGERY  08/09   spinal decompression surgery   Family History  Problem Relation Age of Onset   COPD Brother    Cancer Sister        brain tumor with hemmorhage   Heart disease Sister        CAD   Social History   Socioeconomic History   Marital status: Widowed    Spouse name: Not on file   Number of children: Not on file   Years of education: Not on file   Highest education level: Not on file  Occupational History   Not on file  Tobacco Use   Smoking status: Never    Passive exposure: Never   Smokeless tobacco: Never  Vaping Use   Vaping Use: Not on file  Substance and Sexual Activity   Alcohol use: No    Alcohol/week: 0.0 standard drinks of alcohol   Drug use: No   Sexual activity: Never  Other Topics Concern   Not on file  Social History Narrative   Not on file   Social Determinants of Health   Financial Resource Strain: Low Risk  (05/30/2022)   Overall Financial Resource Strain (CARDIA)    Difficulty of Paying Living Expenses: Not hard at all  Food Insecurity: No Food Insecurity (05/30/2022)   Hunger Vital Sign    Worried About Running Out of Food in the Last Year: Never true    Ran Out of Food in the Last Year: Never true  Transportation Needs: No Transportation Needs (05/30/2022)    PRAPARE - Hydrologist (Medical): No    Lack of Transportation (Non-Medical): No  Physical Activity: Inactive (05/30/2022)   Exercise Vital Sign    Days of Exercise per Week: 0 days    Minutes of Exercise per Session: 0 min  Stress: No Stress Concern Present (05/30/2022)   [place]    Feeling of Stress : Not at all  Social Connections: Unknown (05/30/2022)   Social Connection and Isolation Panel [NHANES]    Frequency of Communication with Friends and Family: More than three times a week    Frequency of Social Gatherings with Friends and Family: Twice a week    Attends Religious Services: Never    Marine scientist or Organizations: Yes    Attends Music therapist: More than 4 times per year    Marital Status: Patient refused    Tobacco Counseling Counseling given: Not Answered   Clinical Intake:  Pre-visit preparation completed: No  Pain : No/denies pain     Nutritional Status: BMI > 30  Obese Nutritional Risks: None Diabetes: Yes CBG done?: No Did pt. bring in CBG monitor from home?: No  How often do you need to have someone help you when you read instructions, pamphlets, or other written materials from your doctor or pharmacy?: 1 - Never What is the last grade level you completed in school?: 12th grade  Nutrition Risk Assessment:  Has the patient had any N/V/D within the last 2 months?  No  Does the patient have any non-healing wounds?  No  Has the patient had any unintentional weight loss or weight gain?  No   Diabetes:  Is the patient diabetic?  Yes  If diabetic, was a CBG obtained today?  No  Did the  patient bring in their glucometer from home?  No  How often do you monitor your CBG's? 1x daily in the morning.   Financial Strains and Diabetes Management:  Are you having any financial strains with the device, your supplies or your medication? No  .  Does the patient want to be seen by Chronic Care Management for management of their diabetes?  No  Would the patient like to be referred to a Nutritionist or for Diabetic Management?  No   Diabetic Exams:  Diabetic Eye Exam: Completed September  2023 Diabetic Foot Exam: Completed 12/25/2021    Interpreter Needed?: No  Information entered by :: Jillene Bucks, [place]   Activities of Daily Living    05/30/2022    4:04 PM 01/07/2022    6:00 PM  In your present state of health, do you have any difficulty performing the following activities:  Hearing? 1   Comment wears hearing aids   Vision? 0   Difficulty concentrating or making decisions? 0   Walking or climbing stairs? 1   Dressing or bathing? 1   Doing errands, shopping? 1 1  Preparing Food and eating ? Y   Comment preparing food   Using the Toilet? Y   In the past six months, have you accidently leaked urine? Y   Do you have problems with loss of bowel control? N   Managing your Medications? Y   Managing your Finances? Y   Housekeeping or managing your Housekeeping? Y     Patient Care Team: Girtha Rm, NP-C as PCP - General (Family Medicine) Dagoberto Ligas, MD as Referring Physician (Internal Medicine) Webb Laws, [place] as Referring Physician (Optometry) Caralee Ates, DDS as Consulting Physician (Dentistry) Luretha Rued, RN as [place] any recent [place] you may have received from other than [facility] providers in the past year (date may be approximate).     Assessment:   This is a routine wellness examination for [place].  Hearing/Vision screen Patient has hearing difficulty and wears hearing aids. Patient wears corrective lenses.   Dietary issues and exercise activities discussed: Current Exercise Habits: The patient does not participate in regular exercise at present, Exercise limited by: orthopedic condition(s)   Goals Addressed              This Visit's Progress    DIET - INCREASE WATER INTAKE         Depression Screen    05/30/2022    4:02 PM 03/23/2022   11:17 AM 02/28/2022   11:08 AM 07/10/2021    2:21 PM 01/13/2021   12:24 PM 12/19/2020   11:20 AM 12/01/2019   11:57 AM  PHQ 2/9 Scores  PHQ - 2 Score 0 1 0 1 0 0 0    Fall Risk    05/30/2022    4:04 PM 02/28/2022   11:08 AM 07/10/2021    2:22 PM 01/13/2021   12:23 PM 09/13/2020    2:50 PM  [place] in the past year? 0  0 0 0  Number falls in past yr: 0 0  0 0  Injury with Fall? 0 0  0 0  Risk for fall due to : Impaired balance/gait;Impaired mobility   Impaired balance/gait   Follow up Falls evaluation completed   Falls evaluation completed;Falls prevention discussed Falls evaluation completed    FALL RISK PREVENTION PERTAINING TO THE HOME:  Any stairs in or around the home? Yes  If so, are there any without handrails? No also has ramp Home free of loose throw rugs in walkways, pet beds, electrical cords, etc? Yes  Adequate lighting in your home to reduce risk of falls? Yes   ASSISTIVE DEVICES UTILIZED TO PREVENT FALLS:  Life alert? Yes  Use of a cane, walker or w/c? Yes  Grab bars in the bathroom? Yes  Shower chair or bench in shower? Yes  Elevated toilet seat or a handicapped toilet? Yes   TIMED UP AND GO:  Was the test performed? No .  Length of time to ambulate 10 feet: N/A sec.   Patient was in wheelchair today.   Cognitive Function:    03/14/2018   12:00 PM 08/31/2016    2:05 PM  MMSE - Mini Mental State Exam  Orientation to time 5 5  Orientation to Place 5 5  Registration 3 3  Attention/ Calculation 0 0  Recall 3 3  Language- name 2 objects 0 0  Language- repeat 1 1  Language- follow 3 step command 3 3  Language- read & follow direction 0 0  Write a sentence 0 0  Copy design 0 0  Total score 20 20        05/30/2022    4:06 PM  6CIT Screen  What Year? 4 points  What month? 0 points  What time? 0 points  Count  back from 20 0 points  Months in reverse 0 points  Repeat phrase 0 points  Total Score 4 points    Immunizations Immunization History  Administered Date(s) Administered   Fluad Quad(high Dose 65+) 06/02/2019, 05/13/2020, 05/26/2021, 05/30/2022   Influenza Split 05/21/2011, 06/11/2012   Influenza Whole 05/26/2004, 05/23/2010   Influenza,inj,Quad PF,6+ Mos 04/17/2013, 04/27/2014, 05/20/2015, 05/22/2016, 05/22/2017, 05/22/2018   PFIZER(Purple Top)SARS-COV-2 Vaccination 10/16/2019, 11/17/2019   Pfizer Covid-19 Vaccine Bivalent Booster 35yr & up 06/15/2021   Pneumococcal Conjugate-13 04/27/2014   Pneumococcal Polysaccharide-23 09/26/2005   Td 05/21/1999, 04/04/2012   Zoster Recombinat (Shingrix) 01/06/2020, 03/15/2020    TDAP status: Due, Education has been provided regarding the importance of this vaccine. Advised may receive this vaccine at local pharmacy or Health Dept. Aware to provide a copy of the vaccination record if obtained from local pharmacy or Health Dept. Verbalized acceptance and understanding.  Flu Vaccine status: Up to date  Pneumococcal vaccine status: Up to date  Covid-19 vaccine status: Completed vaccines  Qualifies for Shingles Vaccine? Yes   Zostavax completed No   Shingrix Completed?: Yes  Screening Tests Health Maintenance  Topic Date Due   OPHTHALMOLOGY EXAM  02/04/2020   MAMMOGRAM  08/02/2021   COVID-19 Vaccine (4 - Pfizer series) 10/16/2021   TETANUS/TDAP  04/04/2022   HEMOGLOBIN A1C  08/31/2022   FOOT EXAM  12/26/2022   Pneumonia Vaccine 88 Years old  Completed   INFLUENZA VACCINE  Completed   DEXA SCAN  Completed   Zoster Vaccines- Shingrix  Completed   HPV VACCINES  Aged Out    Health Maintenance  Health Maintenance Due  Topic Date Due   OPHTHALMOLOGY EXAM  02/04/2020   MAMMOGRAM  08/02/2021   COVID-19 Vaccine (4 - Pfizer series) 10/16/2021   TETANUS/TDAP  04/04/2022    Colorectal cancer screening: No longer required.   Mammogram  status: No longer required due to age.  Bone Density status: completed 09/07/2015  Lung Cancer Screening: (Low Dose CT Chest recommended if Age 86-80years, 30 pack-year currently smoking OR have quit w/in 15years.) does not qualify.  Lung Cancer Screening Referral: N/A  Additional Screening:  Hepatitis C Screening: does not qualify; Completed N/A  Vision Screening: Recommended annual ophthalmology exams for early detection of glaucoma and other disorders of the eye. Is the patient up to date with their annual eye exam?  Yes  Who is the provider or what is the name of the office in which the patient attends annual eye exams? Dr. Edwyna Shell If pt is not established with a provider, would they like to be referred to a provider to establish care? No .   Dental Screening: Recommended annual dental exams for proper oral hygiene  Community Resource Referral / Chronic Care Management: CRR required this visit?  No   CCM required this visit?  No      Plan:     I have personally reviewed and noted the following in the patient's chart:   Medical and social history Use of alcohol, tobacco or illicit drugs  Current medications and supplements including opioid prescriptions. Patient is not currently taking opioid prescriptions. Functional ability and status Nutritional status Physical activity Advanced directives List of other physicians Hospitalizations, surgeries, and ER visits in previous 12 months Vitals Screenings to include cognitive, depression, and falls Referrals and appointments  In addition, I have reviewed and discussed with patient certain preventive protocols, quality metrics, and best practice recommendations. A written personalized care plan for preventive services as well as general preventive health recommendations were provided to patient.     Rossie Muskrat, [place]   05/30/2022   Nurse Notes:   Patient received her flu vaccine today.

## 2022-05-30 NOTE — Patient Instructions (Addendum)
It was great speaking with you today!!  You received your flu vaccine today.   Please schedule your next Medicare Wellness Visit with your Nurse Health Advisor in 1 year by calling [phone].

## 2022-06-01 ENCOUNTER — Encounter (HOSPITAL_COMMUNITY): Payer: Self-pay

## 2022-06-01 ENCOUNTER — Ambulatory Visit (HOSPITAL_COMMUNITY): Payer: Medicare HMO

## 2022-06-01 DIAGNOSIS — M6281 Muscle weakness (generalized): Secondary | ICD-10-CM | POA: Diagnosis not present

## 2022-06-01 DIAGNOSIS — R2689 Other abnormalities of gait and mobility: Secondary | ICD-10-CM | POA: Diagnosis not present

## 2022-06-01 NOTE — Therapy (Signed)
OUTPATIENT PHYSICAL THERAPY NEURO THERAPY   Patient Name: Renee [name] MRN: [id number] DOB:1928-09-29, 88 y.o., female Today's Date: 06/01/2022   PCP: Renee Dingwall NP REFERRING PROVIDER: Girtha Rm, NP-C    PT End of Session - 06/01/22 1650     Visit Number 11    Number of Visits 24    Date for PT Re-Evaluation 06/19/22    Authorization Type [employer] Medicare    Progress Note Due on Visit 19    PT Start Time 1604    PT Stop Time 1648    PT Time Calculation (min) 44 min    Activity Tolerance Patient tolerated treatment well;Patient limited by fatigue;No increased pain    Behavior During Therapy WFL for tasks assessed/performed                     Past Medical History:  Diagnosis Date   Angioedema    possibly from voltaren   Bronchopneumonia 12/11/2016   Degenerative disc disease    Diabetes mellitus    type II   Hyperlipidemia    Hypertension    LVH (left ventricular hypertrophy)    and atrial enlargement by echo in past with nl EF   Nasal pruritis    Osteoarthritis    Osteopenia    Renal insufficiency    Sleep apnea    Stroke ([place]) 05/2010   Small vessel sobcortical (in Point trial) with Dr Renee [name], residual L hemiparesis   Vitamin B 12 deficiency 04/08   Past Surgical History:  Procedure Laterality Date   ABDOMINAL HYSTERECTOMY     BSO-fibroids   APPENDECTOMY     BACK SURGERY     COLON SURGERY     due to punctured intestines   EYE SURGERY     cataract extraction   IR THORACENTESIS ASP PLEURAL SPACE W/IMG GUIDE  06/07/2020   KNEE SURGERY     arthroscope   PARS PLANA VITRECTOMY  07/31/2011   Procedure: PARS PLANA VITRECTOMY WITH 25 GAUGE;  Surgeon: Renee Pedro, MD;  Location: [facility];  Service: Ophthalmology;  Laterality: Right;  REMOVAL OF SILICONE OIL AND LASER RIGHT EYE   RETINAL DETACHMENT SURGERY  02/18/11   times 2   SPINE SURGERY  08/09   spinal decompression surgery   Patient Active Problem List   Diagnosis Date Noted    History of UTI 02/28/2022   Physical deconditioning 01/19/2022   Chronic respiratory failure with hypoxia ([place]) 01/19/2022   Asthma, chronic, unspecified asthma severity, with acute exacerbation 01/06/2022   Allergic rhinitis 01/06/2022   Chronic diastolic CHF (congestive heart failure) ([place]) 01/06/2022   Obesity (BMI 30-39.9) 01/06/2022   Acute respiratory failure with hypoxia ([place]) 01/05/2022   Bradycardia 09/01/2021   UTI (urinary tract infection) 08/29/2021   Wheezing on expiration 08/29/2021   Sepsis due to pneumonia ([place]) 06/07/2020   Pleural effusion 05/13/2020   Frequent UTI 05/13/2020   Dizziness 02/12/2020   Normocytic anemia 02/12/2020   Fall involving sidewalk curb 11/29/2019   Contusion of back 11/27/2019   Aortic atherosclerosis ([place]) 07/13/2019   CKD (chronic kidney disease) stage 4, GFR 15-29 ml/min ([place]) 07/01/2019   External hemorrhoid 01/17/2018   History of colitis 01/17/2018   Generalized weakness 12/24/2016   Diabetic retinopathy ([place]) 12/11/2016   History of CVA (cerebrovascular accident) 12/11/2016   Weakness secondary to UTI 10/21/2015   Acute renal failure superimposed on stage 3b chronic kidney disease ([place]) 10/12/2015   CAP (community acquired pneumonia) 10/07/2015  Estrogen deficiency 08/30/2015   Mobility impaired 06/26/2011   History of retinal detachment 01/10/2011   Sleep apnea 11/28/2010   Anxiety and depression 08/25/2010   Hemiplegia, late effect of cerebrovascular disease ([place]) 07/05/2010   POSTHERPETIC NEURALGIA 11/09/2009   Renal insufficiency 06/29/2008   Chronic back pain 01/26/2008   EDEMA 01/26/2008   B12 deficiency 01/10/2007   Type 2 diabetes, controlled, with retinopathy ([place]) 11/27/2006   Essential hypertension 11/27/2006   FIBROCYSTIC BREAST DISEASE 11/27/2006   ROSACEA 11/27/2006   OSTEOARTHRITIS 11/27/2006   Urinary incontinence 11/27/2006    ONSET DATE: Chronic   REFERRING DIAG: Z86.73 (ICD-10-CM) - History of CVA  (cerebrovascular accident) R53.1 (ICD-10-CM) - Generalized weakness R53.81 (ICD-10-CM) - Physical deconditioning Z74.09 (ICD-10-CM) - Mobility impaired I50.32 (ICD-10-CM) - Chronic diastolic CHF (congestive heart failure) (HCC)   THERAPY DIAG:  Muscle weakness (generalized)  Other abnormalities of gait and mobility  Rationale for Evaluation and Treatment Rehabilitation  SUBJECTIVE:                                                                                                                                                                                              SUBJECTIVE STATEMENT: Reports she is feeling good, arrived with caregiver who sat through session.  No reports of pain or recent falls.     Eval: Patient presents to therapy with complaint of weakness. She states she had a pneumonia in May and was hospitalized. She had some home health therapy following but would like to do some more aggressive therapy for strength and balance. She does walk some with a walker. She has had therapy in the past for LT sided weakness related to history of CVA.   Pt accompanied by: friend Renee [name], primary care giver)   PERTINENT HISTORY: CVA  PAIN:  Are you having pain? No  PRECAUTIONS: Fall  WEIGHT BEARING RESTRICTIONS No  FALLS: Has patient fallen in last 6 months? No  LIVING ENVIRONMENT: Lives with: lives with an adult companion Lives in: House/apartment Stairs: No (ramp) Has following equipment at home: Environmental consultant - 2 wheeled, Wheelchair (manual), shower chair, and Ramped entry  PLOF: Needs assistance with ADLs  PATIENT GOALS  Being able to stand on my own, be able to walk from living room to kitchen.   OBJECTIVE:   DIAGNOSTIC FINDINGS: NA  COGNITION: Overall cognitive status: History of cognitive impairments - at baseline    LOWER EXTREMITY MMT:    MMT Right Eval Left Eval Right 05/16/22 Left 05/16/22  Hip flexion 4+ 4 4+ 4-  Hip extension 5 4+    Hip abduction  Hip adduction      Hip internal rotation      Hip external rotation      Knee flexion      Knee extension 5 4+ 5 5  Ankle dorsiflexion      Ankle plantarflexion      Ankle inversion      Ankle eversion      (Blank rows = not tested)  BED MOBILITY:  Sit to supine Modified independence Supine to sit Modified independence Rolling to Right Modified independence Rolling to Left Modified independence  TRANSFERS: Assistive device utilized: Environmental consultant - 2 wheeled  Sit to stand: Modified independence Stand to sit: Modified independence Chair to chair: Modified independence   GAIT: Gait pattern:  decrease stride, flexed trunk, LT knee flexed Distance walked: 170 feet  Assistive device utilized: Walker - 2 wheeled Level of assistance: Modified independence Comments:   FUNCTIONAL TESTs:  2 minute walk test: 90 feet with RW   TODAY'S TREATMENT:  06/01/22: 2MWT 39f, continued walking with RW for 2289fprior rest break Sit to stand from standard chair height Heel raises x 15 Sidestepping // bars down and back x 2 with CGA Squats with HHA 2x 10 Toe tapping alternating with HHA 6in step 2 x 10  Tandem stance 2x 30" with intermittent HHA required   Seated hamstring stretch with foot on 12in step 2x 30" LAQ 10x each  10/10/263 Heel raises x 15 Marching 2 x 10 Toe tapping alternating with HHA 6in step 2 x 10  Hip extension 10x Hip abduction x 10 each 2 sets Squats with HHA 2x 10 Sidestepping // bars down and back x 2 with CGA  Sit to stand from chair 2 x 5    05/16/22 Progress note 30 sec sit to stand x 2 2 MWT 73 ft   standing // bars Heel raises x 15 Marching 2 x 10 4" box toe taps x 15  Hip abduction x 10 each 4" step ups Right x 8, left x 4 Sidestepping // bars down and back x 2 with CGA  Sit to stand from chair 2 x 5     05/15/22 Seated: Heel/ toe raises x 20 3# LAQ's x 20 3# hip flexion x 20 Hip adduction with ball x 20 Hip abduction with green  thera band x 20 Scapular retractions 2 x 10  Standing: // bars Marching x 10 Mini squats x 10 Sidestepping down and back x 1; cga for safety Heel raises x 20 4" box toe taps 2 x 10 each; CGA for safety    Nustep  (seat 7) lv 1 5 min   05/10/22 Seated: Heel/ toe raises x 20 3# LAQ's x 20 3# hip flexion x 20 Scapular retractions 2 x 10  Sidestepping in bars with 3 lb ankle weights  3 RT  Ambulation with RW and CGA with WC following 110 ft  Nustep  (seat 7) lv 1 4 min  9/14/263 STS from mat, standing with RW 2x 5 reps eccentric control. Gait training with RW WC behind CGA x [id number]: Bridge 2x 10 Standing:  heel raises 15x  Sidestep 3RT inside //bars  Minisquat front of WC with HHA 10x Seated hamstring stretch 2x 20" BLE on step  04/30/22 Seated: Heel/ toe raises x 20 3# LAQ's x 20 3# hip flexion x 20 Seated rows RTB x30 Sit to stand  2 x 10 with UE assist   Sidestepping in bars with 3 lb ankle weights  3 RT Heel raises x 10 Marching with 3 ob weights 2 x 10  Ambulation with RW and CGA with WC following 60 ft  PATIENT EDUCATION: Education details: on Eval findings, POC and HEP  Person educated: Patient and Therapist, music Education method: Explanation Education comprehension: verbalized understanding   HOME EXERCISE PROGRAM:  04/16/22 Quad sets  Access Code: [email or web address] URL: [email or web address] Date: 04/11/2022 Prepared by: [facility] - Rehab  Exercises - Seated March  - 3 x daily - 7 x weekly - 2 sets - 10 reps - Seated Long Arc Quad  - 3 x daily - 7 x weekly - 2 sets - 10 reps - Seated Heel Toe Raises  - 3 x daily - 7 x weekly - 2 sets - 10 reps - Seated Hip Adduction Squeeze with Ball  - 3 x daily - 7 x weekly - 2 sets - 10 reps - Seated Hip Abduction with Resistance  - 3 x daily - 7 x weekly - 2 sets - 10 reps  05/03/22:  Bridge   GOALS: SHORT TERM GOALS: Target date: 04/17/2022  Patient will be independent with initial HEP and  self-management strategies to improve functional outcomes Baseline:  Goal status: met  LONG TERM GOALS: Target date: 05/22/2022  Patient will be independent with advanced HEP and self-management strategies to improve functional outcomes Baseline:  Goal status:ongoing  2.  Patient will report at least 65% overall improvement in subjective complaint to indicate improvement in ability to perform ADLs. Baseline: 05/16/22 "50%" better Goal status: ongoing  3.  Patient will be able to perform stand x 5 in < 30 seconds to demonstrate improvement in functional mobility and reduced risk for falls.  Baseline: 1 rep in 30 sec time ; 05/16/22 2 x in 30 sec Goal status: ongoing  4. Patient will have equal to or 5/5 MMT throughout LLE to improve ability to perform functional mobility, stair ambulation and ADLs.  Baseline: See MMT Goal status: ongoing  5. Patient will be able to ambulate at least 200 feet during 2MWT with LRAD to demonstrate improved ability to perform functional mobility and associated tasks. Baseline: 90 feet with RW; 05/16/22 73 ft Goal status: ongoing  ASSESSMENT:  CLINICAL IMPRESSION: Began session with 2MWT, once 2 minutes was over pt continued to ambulate 274f prior need for rest break.  Added hamstring stretches to POC and HEP with printout given to address extension lag with gait, pt continues to require cueing to reduce trunk and knee flexion during exercise.  Added tandem stance to address balance deficits, did require intermittent HHA.  Able to complete all exercises with no reports of pain.  Was limited by fatigue with several seated rest breaks needed through session.  OBJECTIVE IMPAIRMENTS Abnormal gait, decreased activity tolerance, decreased balance, decreased endurance, decreased mobility, difficulty walking, decreased strength, and improper body mechanics.   ACTIVITY LIMITATIONS standing, transfers, and locomotion level  PARTICIPATION LIMITATIONS: community  activity  PERSONAL FACTORS Age, Past/current experiences, and Time since onset of injury/illness/exacerbation are also affecting patient's functional outcome.   REHAB POTENTIAL: Good  CLINICAL DECISION MAKING: Stable/uncomplicated  EVALUATION COMPLEXITY: Low  PLAN: PT FREQUENCY: 2x/week  PT DURATION: 4 weeks  PLANNED INTERVENTIONS: Therapeutic exercises, Therapeutic activity, Neuromuscular re-education, Balance training, Gait training, Patient/Family education, Joint manipulation, Joint mobilization, Stair training, Aquatic Therapy, Dry Needling, Electrical stimulation, Spinal manipulation, Spinal mobilization, Cryotherapy, Moist heat, scar mobilization, Taping, Traction, Ultrasound, Biofeedback, Ionotophoresis 427mml Dexamethasone, and Manual therapy.   PLAN FOR NEXT SESSION: recommend extend  PT 2 week 4 to address remaining unmet and partially met goals. Balance gait and strengthening  Ihor Austin, LPTA/CLT; CBIS [phone]  5:47 PM, 06/01/22

## 2022-06-06 ENCOUNTER — Encounter: Payer: Self-pay | Admitting: Family Medicine

## 2022-06-06 ENCOUNTER — Encounter (HOSPITAL_COMMUNITY): Payer: Self-pay

## 2022-06-06 ENCOUNTER — Ambulatory Visit (HOSPITAL_COMMUNITY): Payer: Medicare HMO

## 2022-06-06 ENCOUNTER — Ambulatory Visit (INDEPENDENT_AMBULATORY_CARE_PROVIDER_SITE_OTHER): Payer: Medicare HMO | Admitting: Family Medicine

## 2022-06-06 VITALS — BP 126/58 | HR 65 | Temp 97.5°F | Ht 63.0 in

## 2022-06-06 DIAGNOSIS — R5381 Other malaise: Secondary | ICD-10-CM

## 2022-06-06 DIAGNOSIS — I5032 Chronic diastolic (congestive) heart failure: Secondary | ICD-10-CM

## 2022-06-06 DIAGNOSIS — E11319 Type 2 diabetes mellitus with unspecified diabetic retinopathy without macular edema: Secondary | ICD-10-CM | POA: Diagnosis not present

## 2022-06-06 DIAGNOSIS — N184 Chronic kidney disease, stage 4 (severe): Secondary | ICD-10-CM | POA: Diagnosis not present

## 2022-06-06 DIAGNOSIS — R2689 Other abnormalities of gait and mobility: Secondary | ICD-10-CM | POA: Diagnosis not present

## 2022-06-06 DIAGNOSIS — M6281 Muscle weakness (generalized): Secondary | ICD-10-CM

## 2022-06-06 DIAGNOSIS — Z8673 Personal history of transient ischemic attack (TIA), and cerebral infarction without residual deficits: Secondary | ICD-10-CM

## 2022-06-06 NOTE — Progress Notes (Signed)
Subjective:     Patient ID: Renee [name], female    DOB: November 25, 1928, 88 y.o.   MRN: [id number]  Chief Complaint  Patient presents with   Follow-up    3 month f/u     HPI Patient is in today for f/u on DM and other chronic health conditions. She is sitting in a wheelchair.  Previous A1c 7.9%.  Caregiver is with her and states blood sugars at home have been controlled. No hypoglycemia.   [place]. States she had a doughnut this morning. Usually her diet is better.   Taking Actos 30 mg daily, Januvia 100 mg daily, glipizide XL 5 mg daily    Stage 4 renal disease and is under the care of nephrologist   Denies fever, chills, dizziness, chest pain, palpitations, shortness of breath, cough, abdominal pain, N/V/D, urinary symptoms, LE edema.      Health Maintenance Due  Topic Date Due   OPHTHALMOLOGY EXAM  02/04/2020   MAMMOGRAM  08/02/2021    Past Medical History:  Diagnosis Date   Angioedema    possibly from voltaren   Bronchopneumonia 12/11/2016   Degenerative disc disease    Diabetes mellitus    type II   Hyperlipidemia    Hypertension    LVH (left ventricular hypertrophy)    and atrial enlargement by echo in past with nl EF   Nasal pruritis    Osteoarthritis    Osteopenia    Renal insufficiency    Sleep apnea    Stroke ([place]) 05/2010   Small vessel sobcortical (in Point trial) with Dr Leonie Man, residual L hemiparesis   Vitamin B 12 deficiency 04/08    Past Surgical History:  Procedure Laterality Date   ABDOMINAL HYSTERECTOMY     BSO-fibroids   APPENDECTOMY     BACK SURGERY     COLON SURGERY     due to punctured intestines   EYE SURGERY     cataract extraction   IR THORACENTESIS ASP PLEURAL SPACE W/IMG GUIDE  06/07/2020   KNEE SURGERY     arthroscope   PARS PLANA VITRECTOMY  07/31/2011   Procedure: PARS PLANA VITRECTOMY WITH 25 GAUGE;  Surgeon: [name], MD;  Location: [facility];  Service: Ophthalmology;   Laterality: Right;  REMOVAL OF SILICONE OIL AND LASER RIGHT EYE   RETINAL DETACHMENT SURGERY  02/18/11   times 2   SPINE SURGERY  08/09   spinal decompression surgery    Family History  Problem Relation Age of Onset   COPD Brother    Cancer Sister        brain tumor with hemmorhage   Heart disease Sister        CAD    Social History   Socioeconomic History   Marital status: Widowed    Spouse name: Not on file   Number of children: Not on file   Years of education: Not on file   Highest education level: Not on file  Occupational History   Not on file  Tobacco Use   Smoking status: Never    Passive exposure: Never   Smokeless tobacco: Never  Vaping Use   Vaping Use: Not on file  Substance and Sexual Activity   Alcohol use: No    Alcohol/week: 0.0 standard drinks of alcohol   Drug use: No   Sexual activity: Never  Other Topics Concern   Not on file  Social History Narrative   Not  on file   Social Determinants of Health   Financial Resource Strain: Low Risk  (05/30/2022)   Overall Financial Resource Strain (CARDIA)    Difficulty of Paying Living Expenses: Not hard at all  Food Insecurity: No Food Insecurity (05/30/2022)   Hunger Vital Sign    Worried About Running Out of Food in the Last Year: Never true    Ran Out of Food in the Last Year: Never true  Transportation Needs: No Transportation Needs (05/30/2022)   PRAPARE - Hydrologist (Medical): No    Lack of Transportation (Non-Medical): No  Physical Activity: Inactive (05/30/2022)   Exercise Vital Sign    Days of Exercise per Week: 0 days    Minutes of Exercise per Session: 0 min  Stress: No Stress Concern Present (05/30/2022)   [place]    Feeling of Stress : Not at all  Social Connections: Unknown (05/30/2022)   Social Connection and Isolation Panel [NHANES]    Frequency of Communication with Friends and  Family: More than three times a week    Frequency of Social Gatherings with Friends and Family: Twice a week    Attends Religious Services: Never    Marine scientist or Organizations: Yes    Attends Music therapist: More than 4 times per year    Marital Status: Patient refused  Intimate Partner Violence: Not At Risk (05/30/2022)   Humiliation, Afraid, Rape, and Kick questionnaire    Fear of Current or Ex-Partner: No    Emotionally Abused: No    Physically Abused: No    Sexually Abused: No    Outpatient Medications Prior to Visit  Medication Sig Dispense Refill   acetaminophen (TYLENOL) 500 MG tablet Take 1,000 mg by mouth at bedtime.     albuterol (PROVENTIL) (2.5 MG/3ML) 0.083% nebulizer solution Take 3 mLs (2.5 mg total) by nebulization every 6 (six) hours as needed for wheezing or shortness of breath. 75 mL 5   albuterol (VENTOLIN HFA) 108 (90 Base) MCG/ACT inhaler INHALE 2 PUFFS INTO THE LUNGS IN THE MORNING, AT NOON, AND AT BEDTIME. 6.7 each 1   amLODipine (NORVASC) 10 MG tablet TAKE 1 TABLET BY MOUTH EVERY DAY (Patient taking differently: Take 10 mg by mouth daily.) 90 tablet 3   aspirin EC 325 MG tablet Take 325 mg by mouth in the morning.     budesonide-formoterol (SYMBICORT) 160-4.5 MCG/ACT inhaler Inhale 2 puffs into the lungs 2 (two) times daily. 10.2 g 1   calcium-vitamin D (OSCAL WITH D) 500-200 MG-UNIT per tablet Take 1 tablet by mouth daily.     Cholecalciferol (VITAMIN D-3) 25 MCG (1000 UT) CAPS Take 1,000 Units by mouth in the morning.     Cranberry 500 MG TABS Take 500 mg by mouth in the morning and at bedtime.     CRANBERRY PO Take by mouth.     docusate sodium (COLACE) 100 MG capsule Take 200 mg by mouth at bedtime.     estradiol (ESTRACE) 0.1 MG/GM vaginal cream Place 1 g vaginally daily.     FLONASE 50 MCG/ACT nasal spray Place 2 sprays into both nostrils See admin instructions. Instill 2 sprays into each nostril at bedtime     furosemide (LASIX)  20 MG tablet Take 1 tablet (20 mg total) by mouth daily. 30 tablet 1   glipiZIDE (GLUCOTROL XL) 5 MG 24 hr tablet Take 1 tablet (5 mg total) by  mouth daily with breakfast. 30 tablet 2   glucose blood (ONETOUCH ULTRA) test strip CHECK BLOOD SUGAR ONCE DAILY AND AS DIRECTED. 100 strip 11   hydrALAZINE (APRESOLINE) 25 MG tablet Take 1 tablet (25 mg total) by mouth every 8 (eight) hours. 90 tablet 1   hydrochlorothiazide (MICROZIDE) 12.5 MG capsule Take 1 capsule (12.5 mg total) by mouth daily. 30 capsule 2   Lancets (ONETOUCH DELICA PLUS [id number]) MISC CHECK BLOOD SUGAR ONCE DAILY AND AS DIRECTED 100 each 2   loratadine (CLARITIN) 10 MG tablet Take 1 tablet (10 mg total) by mouth daily. 30 tablet 1   metoprolol succinate (TOPROL-XL) 25 MG 24 hr tablet Take 12.5 mg by mouth 2 (two) times daily.     mirtazapine (REMERON) 15 MG tablet TAKE 1 TABLET BY MOUTH EVERYDAY AT BEDTIME 90 tablet 0   Multiple Vitamin (MULTIVITAMIN) capsule Take 1 capsule by mouth daily.     Multiple Vitamins-Minerals (PRESERVISION AREDS 2+MULTI VIT) CAPS Take 1 capsule by mouth in the morning and at bedtime.     Olopatadine HCl (PATADAY OP) Place 1 drop into both eyes 2 (two) times daily as needed (for itching or redness).     pantoprazole (PROTONIX) 40 MG tablet Take 1 tablet (40 mg total) by mouth daily at 6 (six) AM. 30 tablet 1   pioglitazone (ACTOS) 30 MG tablet Take 1 tablet (30 mg total) by mouth daily. 90 tablet 3   sertraline (ZOLOFT) 50 MG tablet Take 1 tablet (50 mg total) by mouth daily. 90 tablet 1   sitaGLIPtin (JANUVIA) 100 MG tablet Take 1 tablet (100 mg total) by mouth daily. 30 tablet 2   solifenacin (VESICARE) 10 MG tablet TAKE 1 TABLET BY MOUTH EVERY DAY 30 tablet 2   Spacer/Aero-Holding Chambers DEVI 1 each by Does not apply route at bedtime. Use with inhaler. 1 each 0   trandolapril (MAVIK) 4 MG tablet TAKE 1 TABLET DAILY (Patient taking differently: Take 4 mg by mouth daily.) 90 tablet 3   triamcinolone  (NASACORT) 55 MCG/ACT AERO nasal inhaler Place 2 sprays into the nose daily. (Patient taking differently: Place 2 sprays into the nose See admin instructions. Instill 2 sprays into each nostril at bedtime when not using Flonase) 1 each 5   vitamin B-12 (CYANOCOBALAMIN) 1000 MCG tablet Take 1,000 mcg by mouth daily.      No facility-administered medications prior to visit.    Allergies  Allergen Reactions   Tape Other (See Comments)    The patient's skin is VERY THIN- will TEAR EASILY!!   Voltaren [Diclofenac] Swelling and Other (See Comments)    Angioedema    ROS     Objective:    Physical Exam Constitutional:      General: She is not in acute distress.    Appearance: She is not ill-appearing.  HENT:     Mouth/Throat:     Mouth: Mucous membranes are moist.  Eyes:     Conjunctiva/sclera: Conjunctivae normal.  Cardiovascular:     Rate and Rhythm: Normal rate.  Pulmonary:     Effort: Pulmonary effort is normal.  Musculoskeletal:     Right lower leg: No edema.     Left lower leg: No edema.  Neurological:     Mental Status: She is alert. Mental status is at baseline.  Psychiatric:        Mood and Affect: Mood normal.        Behavior: Behavior normal.     BP (!) 126/58 (  BP Location: Right Arm, Patient Position: Sitting, Cuff Size: Large)   Pulse 65   Temp (!) 97.5 F (36.4 C) (Temporal)   Ht '5\' 3"'$  (1.6 m)   SpO2 97%   BMI 30.65 kg/m  Wt Readings from Last 3 Encounters:  03/23/22 173 lb (78.5 kg)  02/28/22 173 lb 4 oz (78.6 kg)  02/09/22 173 lb (78.5 kg)       Assessment & Plan:   Problem List Items Addressed This Visit       Cardiovascular and Mediastinum   Chronic diastolic CHF (congestive heart failure) (HCC)     Endocrine   Type 2 diabetes, controlled, with retinopathy ([place]) - Primary   Relevant Orders   Hemoglobin A1c     Genitourinary   CKD (chronic kidney disease) stage 4, GFR 15-29 ml/min (HCC)     Other   History of CVA (cerebrovascular  accident)   Physical deconditioning   She is not fasting and would like to hold off on labs today. Recommend stopping at the labs for a Hgb A1c.  Counseling on healthy diet and exercise.  Continue current medications.  Cough resolved. She is euvolemic.  Continue outpatient PT for conditioning.  Follow up in 3-4 months or sooner if needed.   I am having Angelyn A. Arnesen maintain her multivitamin, calcium-vitamin D, docusate sodium, cyanocobalamin, Olopatadine HCl (PATADAY OP), triamcinolone, PreserVision AREDS 2+Multi Vit, Cranberry, acetaminophen, aspirin EC, Vitamin D-3, Flonase, estradiol, OneTouch Delica Plus [id number], pioglitazone, metoprolol succinate, OneTouch Ultra, trandolapril, amLODipine, sertraline, furosemide, hydrALAZINE, pantoprazole, loratadine, budesonide-formoterol, albuterol, Spacer/Aero-Holding Chambers, CRANBERRY PO, mirtazapine, albuterol, sitaGLIPtin, hydrochlorothiazide, glipiZIDE, and solifenacin.  No orders of the defined types were placed in this encounter.

## 2022-06-06 NOTE — Therapy (Signed)
OUTPATIENT PHYSICAL THERAPY NEURO THERAPY   Patient Name: Renee [name] MRN: [id number] DOB:1929/01/13, 88 y.o., female Today's Date: 06/06/2022   PCP: Renee Dingwall NP REFERRING PROVIDER: Girtha Rm, NP-C    PT End of Session - 06/06/22 1601     Visit Number 12    Number of Visits 24    Date for PT Re-Evaluation 06/19/22    Authorization Type [employer] Medicare    Progress Note Due on Visit 19    PT Start Time 1518    PT Stop Time 1558    PT Time Calculation (min) 40 min    Activity Tolerance Patient tolerated treatment well;Patient limited by fatigue;No increased pain    Behavior During Therapy WFL for tasks assessed/performed                      Past Medical History:  Diagnosis Date   Angioedema    possibly from voltaren   Bronchopneumonia 12/11/2016   Degenerative disc disease    Diabetes mellitus    type II   Hyperlipidemia    Hypertension    LVH (left ventricular hypertrophy)    and atrial enlargement by echo in past with nl EF   Nasal pruritis    Osteoarthritis    Osteopenia    Renal insufficiency    Sleep apnea    Stroke ([place]) 05/2010   Small vessel sobcortical (in [place] trial) with Dr Leonie Man, residual L hemiparesis   Vitamin B 12 deficiency 04/08   Past Surgical History:  Procedure Laterality Date   ABDOMINAL HYSTERECTOMY     BSO-fibroids   APPENDECTOMY     BACK SURGERY     COLON SURGERY     due to punctured intestines   EYE SURGERY     cataract extraction   IR THORACENTESIS ASP PLEURAL SPACE W/IMG GUIDE  06/07/2020   KNEE SURGERY     arthroscope   PARS PLANA VITRECTOMY  07/31/2011   Procedure: PARS PLANA VITRECTOMY WITH 25 GAUGE;  Surgeon: Renee Pedro, MD;  Location: [facility];  Service: Ophthalmology;  Laterality: Right;  REMOVAL OF SILICONE OIL AND LASER RIGHT EYE   RETINAL DETACHMENT SURGERY  02/18/11   times 2   SPINE SURGERY  08/09   spinal decompression surgery   Patient Active Problem List   Diagnosis Date Noted    History of UTI 02/28/2022   Physical deconditioning 01/19/2022   Chronic respiratory failure with hypoxia ([place]) 01/19/2022   Asthma, chronic, unspecified asthma severity, with acute exacerbation 01/06/2022   Allergic rhinitis 01/06/2022   Chronic diastolic CHF (congestive heart failure) ([place]) 01/06/2022   Obesity (BMI 30-39.9) 01/06/2022   Acute respiratory failure with hypoxia ([place]) 01/05/2022   Bradycardia 09/01/2021   UTI (urinary tract infection) 08/29/2021   Wheezing on expiration 08/29/2021   Sepsis due to pneumonia ([place]) 06/07/2020   Pleural effusion 05/13/2020   Frequent UTI 05/13/2020   Dizziness 02/12/2020   Normocytic anemia 02/12/2020   Fall involving sidewalk curb 11/29/2019   Contusion of back 11/27/2019   Aortic atherosclerosis ([place]) 07/13/2019   CKD (chronic kidney disease) stage 4, GFR 15-29 ml/min ([place]) 07/01/2019   External hemorrhoid 01/17/2018   History of colitis 01/17/2018   Generalized weakness 12/24/2016   Diabetic retinopathy ([place]) 12/11/2016   History of CVA (cerebrovascular accident) 12/11/2016   Weakness secondary to UTI 10/21/2015   Acute renal failure superimposed on stage 3b chronic kidney disease ([place]) 10/12/2015   CAP (community acquired pneumonia) 10/07/2015  Estrogen deficiency 08/30/2015   Mobility impaired 06/26/2011   History of retinal detachment 01/10/2011   Sleep apnea 11/28/2010   Anxiety and depression 08/25/2010   Hemiplegia, late effect of cerebrovascular disease ([place]) 07/05/2010   POSTHERPETIC NEURALGIA 11/09/2009   Renal insufficiency 06/29/2008   Chronic back pain 01/26/2008   EDEMA 01/26/2008   B12 deficiency 01/10/2007   Type 2 diabetes, controlled, with retinopathy ([place]) 11/27/2006   Essential hypertension 11/27/2006   FIBROCYSTIC BREAST DISEASE 11/27/2006   ROSACEA 11/27/2006   OSTEOARTHRITIS 11/27/2006   Urinary incontinence 11/27/2006    ONSET DATE: Chronic   REFERRING DIAG: Z86.73 (ICD-10-CM) - History of CVA  (cerebrovascular accident) R53.1 (ICD-10-CM) - Generalized weakness R53.81 (ICD-10-CM) - Physical deconditioning Z74.09 (ICD-10-CM) - Mobility impaired I50.32 (ICD-10-CM) - Chronic diastolic CHF (congestive heart failure) (HCC)   THERAPY DIAG:  Muscle weakness (generalized)  Other abnormalities of gait and mobility  Rationale for Evaluation and Treatment Rehabilitation  SUBJECTIVE:                                                                                                                                                                                              SUBJECTIVE STATEMENT: Pt stated she has added the newest hamstring stretch as HEP at home, stated she hold for a few seconds.  Arrived with friend Renee [name], care give this session.  No reoprts of pain or recent falls.      Eval: Patient presents to therapy with complaint of weakness. She states she had a pneumonia in May and was hospitalized. She had some home health therapy following but would like to do some more aggressive therapy for strength and balance. She does walk some with a walker. She has had therapy in the past for LT sided weakness related to history of CVA.   Pt accompanied by: friend Renee [name], primary care giver)   PERTINENT HISTORY: CVA  PAIN:  Are you having pain? No  PRECAUTIONS: Fall  WEIGHT BEARING RESTRICTIONS No  FALLS: Has patient fallen in last 6 months? No  LIVING ENVIRONMENT: Lives with: lives with an adult companion Lives in: House/apartment Stairs: No (ramp) Has following equipment at home: Environmental consultant - 2 wheeled, Wheelchair (manual), shower chair, and Ramped entry  PLOF: Needs assistance with ADLs  PATIENT GOALS  Being able to stand on my own, be able to walk from living room to kitchen.   OBJECTIVE:   DIAGNOSTIC FINDINGS: NA  COGNITION: Overall cognitive status: History of cognitive impairments - at baseline    LOWER EXTREMITY MMT:    MMT Right Eval Left Eval Right 05/16/22  Left 05/16/22  Hip flexion  4+ 4 4+ 4-  Hip extension 5 4+    Hip abduction      Hip adduction      Hip internal rotation      Hip external rotation      Knee flexion      Knee extension 5 4+ 5 5  Ankle dorsiflexion      Ankle plantarflexion      Ankle inversion      Ankle eversion      (Blank rows = not tested)  BED MOBILITY:  Sit to supine Modified independence Supine to sit Modified independence Rolling to Right Modified independence Rolling to Left Modified independence  TRANSFERS: Assistive device utilized: Environmental consultant - 2 wheeled  Sit to stand: Modified independence Stand to sit: Modified independence Chair to chair: Modified independence   GAIT: Gait pattern:  decrease stride, flexed trunk, LT knee flexed Distance walked: 170 feet  Assistive device utilized: Walker - 2 wheeled Level of assistance: Modified independence Comments:   FUNCTIONAL TESTs:  2 minute walk test: 90 feet with RW   TODAY'S TREATMENT:  06/06/22 Ambulate with RW down hallway with caregiver pushing WC behind 10 sit to stand Standing shoulder extension with GTB 2x 10 Seated rows 2x 10 Sidestep 4RT front of hospital bed Marching alternating with HHA 20x  LAQ 2x 10 with 3# Seated hamstring stretch with foot on 8in step 2x 30"  Supine: Bridge 2x 10 SAQ 10x 5"  06/01/22: 2MWT 31f, continued walking with RW for 2260fprior rest break Sit to stand from standard chair height Heel raises x 15 Sidestepping // bars down and back x 2 with CGA Squats with HHA 2x 10 Toe tapping alternating with HHA 6in step 2 x 10  Tandem stance 2x 30" with intermittent HHA required   Seated hamstring stretch with foot on 12in step 2x 30" LAQ 10x each  10/10/263 Heel raises x 15 Marching 2 x 10 Toe tapping alternating with HHA 6in step 2 x 10  Hip extension 10x Hip abduction x 10 each 2 sets Squats with HHA 2x 10 Sidestepping // bars down and back x 2 with CGA  Sit to stand from chair 2 x  5    05/16/22 Progress note 30 sec sit to stand x 2 2 MWT 73 ft   standing // bars Heel raises x 15 Marching 2 x 10 4" box toe taps x 15  Hip abduction x 10 each 4" step ups Right x 8, left x 4 Sidestepping // bars down and back x 2 with CGA  Sit to stand from chair 2 x 5     05/15/22 Seated: Heel/ toe raises x 20 3# LAQ's x 20 3# hip flexion x 20 Hip adduction with ball x 20 Hip abduction with green thera band x 20 Scapular retractions 2 x 10  Standing: // bars Marching x 10 Mini squats x 10 Sidestepping down and back x 1; cga for safety Heel raises x 20 4" box toe taps 2 x 10 each; CGA for safety    Nustep  (seat 7) lv 1 5 min   05/10/22 Seated: Heel/ toe raises x 20 3# LAQ's x 20 3# hip flexion x 20 Scapular retractions 2 x 10  Sidestepping in bars with 3 lb ankle weights  3 RT  Ambulation with RW and CGA with WC following 110 ft  Nustep  (seat 7) lv 1 4 min  9/14/263 STS from mat, standing with RW 2x 5 reps eccentric control.  Gait training with RW WC behind CGA x 243f Supine: Bridge 2x 10 Standing:  heel raises 15x  Sidestep 3RT inside //bars  Minisquat front of WC with HHA 10x Seated hamstring stretch 2x 20" BLE on step  04/30/22 Seated: Heel/ toe raises x 20 3# LAQ's x 20 3# hip flexion x 20 Seated rows RTB x30 Sit to stand  2 x 10 with UE assist   Sidestepping in bars with 3 lb ankle weights  3 RT Heel raises x 10 Marching with 3 ob weights 2 x 10  Ambulation with RW and CGA with WC following 60 ft  PATIENT EDUCATION: Education details: on Eval findings, POC and HEP  Person educated: Patient and CTherapist, musicEducation method: Explanation Education comprehension: verbalized understanding   HOME EXERCISE PROGRAM:  04/16/22 Quad sets  Access Code: [email or web address] URL: [email or web address] Date: 04/11/2022 Prepared by: [facility] - Rehab  Exercises - Seated March  - 3 x daily - 7 x weekly - 2 sets - 10 reps - Seated  Long Arc Quad  - 3 x daily - 7 x weekly - 2 sets - 10 reps - Seated Heel Toe Raises  - 3 x daily - 7 x weekly - 2 sets - 10 reps - Seated Hip Adduction Squeeze with Ball  - 3 x daily - 7 x weekly - 2 sets - 10 reps - Seated Hip Abduction with Resistance  - 3 x daily - 7 x weekly - 2 sets - 10 reps  05/03/22:  Bridge   GOALS: SHORT TERM GOALS: Target date: 04/17/2022  Patient will be independent with initial HEP and self-management strategies to improve functional outcomes Baseline:  Goal status: met  LONG TERM GOALS: Target date: 05/22/2022  Patient will be independent with advanced HEP and self-management strategies to improve functional outcomes Baseline:  Goal status:ongoing  2.  Patient will report at least 65% overall improvement in subjective complaint to indicate improvement in ability to perform ADLs. Baseline: 05/16/22 "50%" better Goal status: ongoing  3.  Patient will be able to perform stand x 5 in < 30 seconds to demonstrate improvement in functional mobility and reduced risk for falls.  Baseline: 1 rep in 30 sec time ; 05/16/22 2 x in 30 sec Goal status: ongoing  4. Patient will have equal to or 5/5 MMT throughout LLE to improve ability to perform functional mobility, stair ambulation and ADLs.  Baseline: See MMT Goal status: ongoing  5. Patient will be able to ambulate at least 200 feet during 2MWT with LRAD to demonstrate improved ability to perform functional mobility and associated tasks. Baseline: 90 feet with RW; 05/16/22 73 ft Goal status: ongoing  ASSESSMENT:  CLINICAL IMPRESSION: Began session with 2MWT, once 2 minutes was over pt continued to ambulate 2266fprior need for rest break.  Added hamstring stretches to POC and HEP with printout given to address extension lag with gait, pt continues to require cueing to reduce trunk and knee flexion during exercise.  Added tandem stance to address balance deficits, did require intermittent HHA.  Able to complete all  exercises with no reports of pain.  Was limited by fatigue with several seated rest breaks needed through session.  Added exercises to address postural and knee extension lag to POC.  SBA required for safety with standing postural strengthening and frequent cueing to stand erect.  Pt limited by fatigue with activities this session, required intermittent seated rest breaks.  No reports of pain through session.   OBJECTIVE  IMPAIRMENTS Abnormal gait, decreased activity tolerance, decreased balance, decreased endurance, decreased mobility, difficulty walking, decreased strength, and improper body mechanics.   ACTIVITY LIMITATIONS standing, transfers, and locomotion level  PARTICIPATION LIMITATIONS: community activity  PERSONAL FACTORS Age, Past/current experiences, and Time since onset of injury/illness/exacerbation are also affecting patient's functional outcome.   REHAB POTENTIAL: Good  CLINICAL DECISION MAKING: Stable/uncomplicated  EVALUATION COMPLEXITY: Low  PLAN: PT FREQUENCY: 2x/week  PT DURATION: 4 weeks  PLANNED INTERVENTIONS: Therapeutic exercises, Therapeutic activity, Neuromuscular re-education, Balance training, Gait training, Patient/Family education, Joint manipulation, Joint mobilization, Stair training, Aquatic Therapy, Dry Needling, Electrical stimulation, Spinal manipulation, Spinal mobilization, Cryotherapy, Moist heat, scar mobilization, Taping, Traction, Ultrasound, Biofeedback, Ionotophoresis 31m/ml Dexamethasone, and Manual therapy.   PLAN FOR NEXT SESSION: recommend extend PT 2 week 4 to address remaining unmet and partially met goals. Balance gait and strengthening  CIhor Austin LPTA/CLT; CBIS [phone] 5:42 PM, 06/06/22

## 2022-06-06 NOTE — Patient Instructions (Signed)
Please go to the lab for your hemoglobin A1c before you leave today.  Continue your current medications.  I recommend that you get the RSV vaccine and COVID booster at your local pharmacy.  I will be in touch with your lab result.  Please come in fasting for your next visit in 3 to 4 months.

## 2022-06-08 ENCOUNTER — Encounter (HOSPITAL_COMMUNITY): Payer: Self-pay

## 2022-06-08 ENCOUNTER — Ambulatory Visit (HOSPITAL_COMMUNITY): Payer: Medicare HMO

## 2022-06-08 DIAGNOSIS — R2689 Other abnormalities of gait and mobility: Secondary | ICD-10-CM | POA: Diagnosis not present

## 2022-06-08 DIAGNOSIS — M6281 Muscle weakness (generalized): Secondary | ICD-10-CM

## 2022-06-08 NOTE — Therapy (Signed)
OUTPATIENT PHYSICAL THERAPY NEURO THERAPY   Patient Name: Renee [name] MRN: [id number] DOB:01-Jan-1929, 88 y.o., female Today's Date: 06/08/2022   PCP: Harland Dingwall NP REFERRING PROVIDER: Girtha Rm, NP-C    PT End of Session - 06/08/22 1030     Visit Number 13    Number of Visits 24    Date for PT Re-Evaluation 06/19/22    Authorization Type [employer] Medicare    Progress Note Due on Visit 19    PT Start Time 0947    PT Stop Time 1026    PT Time Calculation (min) 39 min    Activity Tolerance Patient tolerated treatment well;Patient limited by fatigue;No increased pain    Behavior During Therapy WFL for tasks assessed/performed                       Past Medical History:  Diagnosis Date   Angioedema    possibly from voltaren   Bronchopneumonia 12/11/2016   Degenerative disc disease    Diabetes mellitus    type II   Hyperlipidemia    Hypertension    LVH (left ventricular hypertrophy)    and atrial enlargement by echo in past with nl EF   Nasal pruritis    Osteoarthritis    Osteopenia    Renal insufficiency    Sleep apnea    Stroke ([place]) 05/2010   Small vessel sobcortical (in [place] trial) with Dr Leonie Man, residual L hemiparesis   Vitamin B 12 deficiency 04/08   Past Surgical History:  Procedure Laterality Date   ABDOMINAL HYSTERECTOMY     BSO-fibroids   APPENDECTOMY     BACK SURGERY     COLON SURGERY     due to punctured intestines   EYE SURGERY     cataract extraction   IR THORACENTESIS ASP PLEURAL SPACE W/IMG GUIDE  06/07/2020   KNEE SURGERY     arthroscope   PARS PLANA VITRECTOMY  07/31/2011   Procedure: PARS PLANA VITRECTOMY WITH 25 GAUGE;  Surgeon: [name], MD;  Location: [facility];  Service: Ophthalmology;  Laterality: Right;  REMOVAL OF SILICONE OIL AND LASER RIGHT EYE   RETINAL DETACHMENT SURGERY  02/18/11   times 2   SPINE SURGERY  08/09   spinal decompression surgery   Patient Active Problem List   Diagnosis Date Noted    History of UTI 02/28/2022   Physical deconditioning 01/19/2022   Chronic respiratory failure with hypoxia ([place]) 01/19/2022   Asthma, chronic, unspecified asthma severity, with acute exacerbation 01/06/2022   Allergic rhinitis 01/06/2022   Chronic diastolic CHF (congestive heart failure) ([place]) 01/06/2022   Obesity (BMI 30-39.9) 01/06/2022   Acute respiratory failure with hypoxia ([place]) 01/05/2022   Bradycardia 09/01/2021   UTI (urinary tract infection) 08/29/2021   Wheezing on expiration 08/29/2021   Sepsis due to pneumonia ([place]) 06/07/2020   Pleural effusion 05/13/2020   Frequent UTI 05/13/2020   Dizziness 02/12/2020   Normocytic anemia 02/12/2020   Fall involving sidewalk curb 11/29/2019   Contusion of back 11/27/2019   Aortic atherosclerosis ([place]) 07/13/2019   CKD (chronic kidney disease) stage 4, GFR 15-29 ml/min ([place]) 07/01/2019   External hemorrhoid 01/17/2018   History of colitis 01/17/2018   Generalized weakness 12/24/2016   Diabetic retinopathy ([place]) 12/11/2016   History of CVA (cerebrovascular accident) 12/11/2016   Weakness secondary to UTI 10/21/2015   Acute renal failure superimposed on stage 3b chronic kidney disease ([place]) 10/12/2015   CAP (community acquired pneumonia)  10/07/2015   Estrogen deficiency 08/30/2015   Mobility impaired 06/26/2011   History of retinal detachment 01/10/2011   Sleep apnea 11/28/2010   Anxiety and depression 08/25/2010   Hemiplegia, late effect of cerebrovascular disease ([place]) 07/05/2010   POSTHERPETIC NEURALGIA 11/09/2009   Renal insufficiency 06/29/2008   Chronic back pain 01/26/2008   EDEMA 01/26/2008   B12 deficiency 01/10/2007   Type 2 diabetes, controlled, with retinopathy ([place]) 11/27/2006   Essential hypertension 11/27/2006   FIBROCYSTIC BREAST DISEASE 11/27/2006   ROSACEA 11/27/2006   OSTEOARTHRITIS 11/27/2006   Urinary incontinence 11/27/2006    ONSET DATE: Chronic   REFERRING DIAG: Z86.73 (ICD-10-CM) - History of CVA  (cerebrovascular accident) R53.1 (ICD-10-CM) - Generalized weakness R53.81 (ICD-10-CM) - Physical deconditioning Z74.09 (ICD-10-CM) - Mobility impaired I50.32 (ICD-10-CM) - Chronic diastolic CHF (congestive heart failure) (H[facility])   THERAPY DIAG:  Muscle weakness (generalized)  Other abnormalities of gait and mobility  Rationale for Evaluation and Treatment Rehabilitation  SUBJECTIVE:                                                                                                                                                                                              SUBJECTIVE STATEMENT: Pt stated she is tired at entrance, today's apt earlier than usual.  Arrived with friend [name] who helped through session.  No reports of pain or recent fall.  Reports she is going to a wedding this weekend.  Eval: Patient presents to therapy with complaint of weakness. She states she had a pneumonia in May and was hospitalized. She had some home health therapy following but would like to do some more aggressive therapy for strength and balance. She does walk some with a walker. She has had therapy in the past for LT sided weakness related to history of CVA.   Pt accompanied by: friend [name], primary care giver)   PERTINENT HISTORY: CVA  PAIN:  Are you having pain? No  PRECAUTIONS: Fall  WEIGHT BEARING RESTRICTIONS No  FALLS: Has patient fallen in last 6 months? No  LIVING ENVIRONMENT: Lives with: lives with an adult companion Lives in: House/apartment Stairs: No (ramp) Has following equipment at home: Environmental consultant - 2 wheeled, Wheelchair (manual), shower chair, and Ramped entry  PLOF: Needs assistance with ADLs  PATIENT GOALS  Being able to stand on my own, be able to walk from living room to kitchen.   OBJECTIVE:   DIAGNOSTIC FINDINGS: NA  COGNITION: Overall cognitive status: History of cognitive impairments - at baseline    LOWER EXTREMITY MMT:    MMT Right Eval Left Eval  Right 05/16/22 Left 05/16/22  Hip  flexion 4+ 4 4+ 4-  Hip extension 5 4+    Hip abduction      Hip adduction      Hip internal rotation      Hip external rotation      Knee flexion      Knee extension 5 4+ 5 5  Ankle dorsiflexion      Ankle plantarflexion      Ankle inversion      Ankle eversion      (Blank rows = not tested)  BED MOBILITY:  Sit to supine Modified independence Supine to sit Modified independence Rolling to Right Modified independence Rolling to Left Modified independence  TRANSFERS: Assistive device utilized: Environmental consultant - 2 wheeled  Sit to stand: Modified independence Stand to sit: Modified independence Chair to chair: Modified independence   GAIT: Gait pattern:  decrease stride, flexed trunk, LT knee flexed Distance walked: 170 feet  Assistive device utilized: Walker - 2 wheeled Level of assistance: Modified independence Comments:   FUNCTIONAL TESTs:  2 minute walk test: 90 feet with RW   TODAY'S TREATMENT:  06/08/22 2MWT 77f, continued walking total of 110 ft Sidestep 2RT down handrails in hallway ~162fStanding shoulder extension with GTB 2x 10 Seated rows 2x 10 Toe tapping 6in step height 10x alternating Heel raise 10x STS with HHA  06/06/22 Ambulate with RW down hallway with caregiver pushing WC behind 10 sit to stand Standing shoulder extension with GTB 2x 10 Seated rows 2x 10 Sidestep 4RT front of hospital bed Marching alternating with HHA 20x  LAQ 2x 10 with 3# Seated hamstring stretch with foot on 8in step 2x 30"  Supine: Bridge 2x 10 SAQ 10x 5"  06/01/22: 2MWT 8077fcontinued walking with RW for [id number] rest break Sit to stand from standard chair height Heel raises x 15 Sidestepping // bars down and back x 2 with CGA Squats with HHA 2x 10 Toe tapping alternating with HHA 6in step 2 x 10  Tandem stance 2x 30" with intermittent HHA required   Seated hamstring stretch with foot on 12in step 2x 30" LAQ 10x  each  10/10/263 Heel raises x 15 Marching 2 x 10 Toe tapping alternating with HHA 6in step 2 x 10  Hip extension 10x Hip abduction x 10 each 2 sets Squats with HHA 2x 10 Sidestepping // bars down and back x 2 with CGA  Sit to stand from chair 2 x 5    05/16/22 Progress note 30 sec sit to stand x 2 2 MWT 73 ft   standing // bars Heel raises x 15 Marching 2 x 10 4" box toe taps x 15  Hip abduction x 10 each 4" step ups Right x 8, left x 4 Sidestepping // bars down and back x 2 with CGA  Sit to stand from chair 2 x 5     05/15/22 Seated: Heel/ toe raises x 20 3# LAQ's x 20 3# hip flexion x 20 Hip adduction with ball x 20 Hip abduction with green thera band x 20 Scapular retractions 2 x 10  Standing: // bars Marching x 10 Mini squats x 10 Sidestepping down and back x 1; cga for safety Heel raises x 20 4" box toe taps 2 x 10 each; CGA for safety    Nustep  (seat 7) lv 1 5 min   05/10/22 Seated: Heel/ toe raises x 20 3# LAQ's x 20 3# hip flexion x 20 Scapular retractions 2 x 10  Sidestepping in  bars with 3 lb ankle weights  3 RT  Ambulation with RW and CGA with WC following 110 ft  Nustep  (seat 7) lv 1 4 min  9/14/263 STS from mat, standing with RW 2x 5 reps eccentric control. Gait training with RW WC behind CGA x 230f Supine: Bridge 2x 10 Standing:  heel raises 15x  Sidestep 3RT inside //bars  Minisquat front of WC with HHA 10x Seated hamstring stretch 2x 20" BLE on step  04/30/22 Seated: Heel/ toe raises x 20 3# LAQ's x 20 3# hip flexion x 20 Seated rows RTB x30 Sit to stand  2 x 10 with UE assist   Sidestepping in bars with 3 lb ankle weights  3 RT Heel raises x 10 Marching with 3 ob weights 2 x 10  Ambulation with RW and CGA with WC following 60 ft  PATIENT EDUCATION: Education details: on Eval findings, POC and HEP  Person educated: Patient and CTherapist, musicEducation method: Explanation Education comprehension:  verbalized understanding   HOME EXERCISE PROGRAM:  04/16/22 Quad sets  Access Code: [email or web address] URL: [email or web address] Date: 04/11/2022 Prepared by: [facility] - Rehab  Exercises - Seated March  - 3 x daily - 7 x weekly - 2 sets - 10 reps - Seated Long Arc Quad  - 3 x daily - 7 x weekly - 2 sets - 10 reps - Seated Heel Toe Raises  - 3 x daily - 7 x weekly - 2 sets - 10 reps - Seated Hip Adduction Squeeze with Ball  - 3 x daily - 7 x weekly - 2 sets - 10 reps - Seated Hip Abduction with Resistance  - 3 x daily - 7 x weekly - 2 sets - 10 reps  05/03/22:  Bridge   GOALS: SHORT TERM GOALS: Target date: 04/17/2022  Patient will be independent with initial HEP and self-management strategies to improve functional outcomes Baseline:  Goal status: met  LONG TERM GOALS: Target date: 05/22/2022  Patient will be independent with advanced HEP and self-management strategies to improve functional outcomes Baseline:  Goal status:ongoing  2.  Patient will report at least 65% overall improvement in subjective complaint to indicate improvement in ability to perform ADLs. Baseline: 05/16/22 "50%" better Goal status: ongoing  3.  Patient will be able to perform stand x 5 in < 30 seconds to demonstrate improvement in functional mobility and reduced risk for falls.  Baseline: 1 rep in 30 sec time ; 05/16/22 2 x in 30 sec Goal status: ongoing  4. Patient will have equal to or 5/5 MMT throughout LLE to improve ability to perform functional mobility, stair ambulation and ADLs.  Baseline: See MMT Goal status: ongoing  5. Patient will be able to ambulate at least 200 feet during 2MWT with LRAD to demonstrate improved ability to perform functional mobility and associated tasks. Baseline: 90 feet with RW; 05/16/22 73 ft Goal status: ongoing  ASSESSMENT:  CLINICAL IMPRESSION: Pt easier fatigued this session, noted heavier breathing and required increased cueing to stand erect and seated rest  breaks that was monitored through session.  Continued work on gait training trying for increased cadence, functional strengthening and balance training.  Pt did require cueing for proper hand positioning with STS through session.   OBJECTIVE IMPAIRMENTS Abnormal gait, decreased activity tolerance, decreased balance, decreased endurance, decreased mobility, difficulty walking, decreased strength, and improper body mechanics.   ACTIVITY LIMITATIONS standing, transfers, and locomotion level  PARTICIPATION LIMITATIONS: community activity  PERSONAL FACTORS Age, Past/current  experiences, and Time since onset of injury/illness/exacerbation are also affecting patient's functional outcome.   REHAB POTENTIAL: Good  CLINICAL DECISION MAKING: Stable/uncomplicated  EVALUATION COMPLEXITY: Low  PLAN: PT FREQUENCY: 2x/week  PT DURATION: 4 weeks  PLANNED INTERVENTIONS: Therapeutic exercises, Therapeutic activity, Neuromuscular re-education, Balance training, Gait training, Patient/Family education, Joint manipulation, Joint mobilization, Stair training, Aquatic Therapy, Dry Needling, Electrical stimulation, Spinal manipulation, Spinal mobilization, Cryotherapy, Moist heat, scar mobilization, Taping, Traction, Ultrasound, Biofeedback, Ionotophoresis 30m/ml Dexamethasone, and Manual therapy.   PLAN FOR NEXT SESSION: recommend extend PT 2 week 4 to address remaining unmet and partially met goals. Balance gait and strengthening  CIhor Austin LPTA/CLT; CBIS [phone] 10:32 AM, 06/08/22

## 2022-06-11 ENCOUNTER — Ambulatory Visit: Payer: Self-pay

## 2022-06-11 NOTE — Patient Outreach (Signed)
  Care Coordination   Follow Up Visit Note   06/11/2022 Name: Renee [name] MRN: [id number] DOB: [birth year]  Renee [name] is a 88 y.o. year old female who sees [place], [place] L, NP-C for primary care. I spoke with  Renee [name] by phone today.  What matters to the patients health and wellness today?  RN spoke with patient caregiver Renee [name](dpr). Telephone assessment completed. Renee [name] denies any resource needs or disease management needs at this time. Renee [name] reports she provides transportation to appointments and manages her medications, and provides care daily. Last office visit with primary care provider 06/06/22. Renee [name] states she is doing fine. Per Renee [name], her only concern is that Renee [name] is being billed for occupational therapy visits while hospitalized that she disputes. Is agreeable to care coordinator closing case.   Goals Addressed             This Visit's Progress    COMPLETED: Care Coordination Activities-no further follow up required       Care Coordination Interventions: Discussed care coordination services and completed assessment Encouraged to contact financial office, contact number provided [phone] or [phone]), patient insurance provider. Care Coordinator provided contact number to Patient Experience [phone]) Encouraged patient caregiver to contact care coordinator or primary care provider if care coordination needs change.       SDOH assessments and interventions completed:  No already completed this month.   Care Coordination Interventions Activated:  Yes  Care Coordination Interventions:  Yes, provided   Follow up plan: No further intervention required.   Encounter Outcome:  Pt. Visit Completed

## 2022-06-13 ENCOUNTER — Encounter (HOSPITAL_COMMUNITY): Payer: Medicare HMO

## 2022-06-15 ENCOUNTER — Ambulatory Visit (HOSPITAL_COMMUNITY): Payer: Medicare HMO

## 2022-06-15 DIAGNOSIS — R2689 Other abnormalities of gait and mobility: Secondary | ICD-10-CM | POA: Diagnosis not present

## 2022-06-15 DIAGNOSIS — M6281 Muscle weakness (generalized): Secondary | ICD-10-CM

## 2022-06-15 NOTE — Therapy (Signed)
OUTPATIENT PHYSICAL THERAPY NEURO THERAPY   Patient Name: Renee [name] MRN: [id number] DOB:[birth year], 88 y.o., female Today's Date: 06/15/2022   PCP: Harland Dingwall NP REFERRING PROVIDER: Girtha Rm, NP-C    PT End of Session - 06/15/22 1041     Visit Number 14    Number of Visits 24    Date for PT Re-Evaluation 06/19/22    Authorization Type [employer] Medicare    Progress Note Due on Visit 4    PT Start Time 1035    PT Stop Time 1115    PT Time Calculation (min) 40 min                        Past Medical History:  Diagnosis Date   Angioedema    possibly from voltaren   Bronchopneumonia 12/11/2016   Degenerative disc disease    Diabetes mellitus    type II   Hyperlipidemia    Hypertension    LVH (left ventricular hypertrophy)    and atrial enlargement by echo in past with nl EF   Nasal pruritis    Osteoarthritis    Osteopenia    Renal insufficiency    Sleep apnea    Stroke ([place]) 05/2010   Small vessel sobcortical (in [place]) with Dr Leonie Man, residual L hemiparesis   Vitamin B 12 deficiency 04/08   Past Surgical History:  Procedure Laterality Date   ABDOMINAL HYSTERECTOMY     BSO-fibroids   APPENDECTOMY     BACK SURGERY     COLON SURGERY     due to punctured intestines   EYE SURGERY     cataract extraction   IR THORACENTESIS ASP PLEURAL SPACE W/IMG GUIDE  06/07/2020   KNEE SURGERY     arthroscope   PARS PLANA VITRECTOMY  07/31/2011   Procedure: PARS PLANA VITRECTOMY WITH 25 GAUGE;  Surgeon: [name], MD;  Location: [place];  Service: Ophthalmology;  Laterality: Right;  REMOVAL OF SILICONE OIL AND LASER RIGHT EYE   RETINAL DETACHMENT SURGERY  02/18/11   times 2   SPINE SURGERY  08/09   spinal decompression surgery   Patient Active Problem List   Diagnosis Date Noted   History of UTI 02/28/2022   Physical deconditioning 01/19/2022   Chronic respiratory failure with hypoxia ([place]) 01/19/2022   Asthma, chronic, unspecified  asthma severity, with acute exacerbation 01/06/2022   Allergic rhinitis 01/06/2022   Chronic diastolic CHF (congestive heart failure) ([place]) 01/06/2022   Obesity (BMI 30-39.9) 01/06/2022   Acute respiratory failure with hypoxia ([place]) 01/05/2022   Bradycardia 09/01/2021   UTI (urinary tract infection) 08/29/2021   Wheezing on expiration 08/29/2021   Sepsis due to pneumonia ([place]) 06/07/2020   Pleural effusion 05/13/2020   Frequent UTI 05/13/2020   Dizziness 02/12/2020   Normocytic anemia 02/12/2020   Fall involving sidewalk curb 11/29/2019   Contusion of back 11/27/2019   Aortic atherosclerosis ([place]) 07/13/2019   CKD (chronic kidney disease) stage 4, GFR 15-29 ml/min ([place]) 07/01/2019   External hemorrhoid 01/17/2018   History of colitis 01/17/2018   Generalized weakness 12/24/2016   Diabetic retinopathy ([place]) 12/11/2016   History of CVA (cerebrovascular accident) 12/11/2016   Weakness secondary to UTI 10/21/2015   Acute renal failure superimposed on stage 3b chronic kidney disease ([place]) 10/12/2015   CAP (community acquired pneumonia) 10/07/2015   Estrogen deficiency 08/30/2015   Mobility impaired 06/26/2011   History of retinal detachment 01/10/2011   Sleep apnea 11/28/2010  Anxiety and depression 08/25/2010   Hemiplegia, late effect of cerebrovascular disease ([place]) 07/05/2010   POSTHERPETIC NEURALGIA 11/09/2009   Renal insufficiency 06/29/2008   Chronic back pain 01/26/2008   EDEMA 01/26/2008   B12 deficiency 01/10/2007   Type 2 diabetes, controlled, with retinopathy ([place]) 11/27/2006   Essential hypertension 11/27/2006   FIBROCYSTIC BREAST DISEASE 11/27/2006   ROSACEA 11/27/2006   OSTEOARTHRITIS 11/27/2006   Urinary incontinence 11/27/2006    ONSET DATE: Chronic   REFERRING DIAG: Z86.73 (ICD-10-CM) - History of CVA (cerebrovascular accident) R53.1 (ICD-10-CM) - Generalized weakness R53.81 (ICD-10-CM) - Physical deconditioning Z74.09 (ICD-10-CM) - Mobility impaired I50.32  (ICD-10-CM) - Chronic diastolic CHF (congestive heart failure) (HCC)   THERAPY DIAG:  Muscle weakness (generalized) - Plan: PT plan of care cert/re-cert  Other abnormalities of gait and mobility - Plan: PT plan of care cert/re-cert  Rationale for Evaluation and Treatment Rehabilitation  SUBJECTIVE:                                                                                                                                                                                              SUBJECTIVE STATEMENT: About "50%" better overall; getting up and down better at home.   Eval: Patient presents to therapy with complaint of weakness. She states she had a pneumonia in May and was hospitalized. She had some home health therapy following but would like to do some more aggressive therapy for strength and balance. She does walk some with a walker. She has had therapy in the past for LT sided weakness related to history of CVA.   Pt accompanied by: friend [name], primary care giver)   PERTINENT HISTORY: CVA  PAIN:  Are you having pain? No  PRECAUTIONS: Fall  WEIGHT BEARING RESTRICTIONS No  FALLS: Has patient fallen in last 6 months? No  LIVING ENVIRONMENT: Lives with: lives with an adult companion Lives in: House/apartment Stairs: No (ramp) Has following equipment at home: Environmental consultant - 2 wheeled, Wheelchair (manual), shower chair, and Ramped entry  PLOF: Needs assistance with ADLs  PATIENT GOALS  Being able to stand on my own, be able to walk from living room to kitchen.   OBJECTIVE:   DIAGNOSTIC FINDINGS: NA  COGNITION: Overall cognitive status: History of cognitive impairments - at baseline    LOWER EXTREMITY MMT:    MMT Right Eval Left Eval Right 05/16/22 Left 05/16/22 Right 06/15/22 Left 06/15/22  Hip flexion 4+ 4 4+ 4- 4+ 4-  Hip extension 5 4+      Hip abduction        Hip adduction  Hip internal rotation        Hip external rotation        Knee flexion         Knee extension 5 4+ _0 Ankle dorsiflexion        Ankle plantarflexion        Ankle inversion        Ankle eversion        (Blank rows = not tested)  BED MOBILITY:  Sit to supine Modified independence Supine to sit Modified independence Rolling to Right Modified independence Rolling to Left Modified independence  TRANSFERS: Assistive device utilized: Environmental consultant - 2 wheeled  Sit to stand: Modified independence Stand to sit: Modified independence Chair to chair: Modified independence   GAIT: Gait pattern:  decrease stride, flexed trunk, LT knee flexed Distance walked: 170 feet  Assistive device utilized: Walker - 2 wheeled Level of assistance: Modified independence Comments:   FUNCTIONAL TESTs:  2 minute walk test: 90 feet with RW   TODAY'S TREATMENT:  06/15/22 Progress note 30 sec sit to stand x 3 (improvement 1 x more from last progress note) 2 MWT 77 ft today with RW  Standing: Heel raises x 10 Marching x 10  06/08/22 2MWT 10f, continued walking total of 110 ft Sidestep 2RT down handrails in hallway ~188fStanding shoulder extension with GTB 2x 10 Seated rows 2x 10 Toe tapping 6in step height 10x alternating Heel raise 10x STS with HHA  06/06/22 Ambulate with RW down hallway with caregiver pushing WC behind 10 sit to stand Standing shoulder extension with GTB 2x 10 Seated rows 2x 10 Sidestep 4RT front of hospital bed Marching alternating with HHA 20x  LAQ 2x 10 with 3# Seated hamstring stretch with foot on 8in step 2x 30"  Supine: Bridge 2x 10 SAQ 10x 5"  06/01/22: 2MWT 8031fcontinued walking with RW for [id number] rest break Sit to stand from standard chair height Heel raises x 15 Sidestepping // bars down and back x 2 with CGA Squats with HHA 2x 10 Toe tapping alternating with HHA 6in step 2 x 10  Tandem stance 2x 30" with intermittent HHA required   Seated hamstring stretch with foot on 12in step 2x 30" LAQ 10x  each  10/10/263 Heel raises x 15 Marching 2 x 10 Toe tapping alternating with HHA 6in step 2 x 10  Hip extension 10x Hip abduction x 10 each 2 sets Squats with HHA 2x 10 Sidestepping // bars down and back x 2 with CGA  Sit to stand from chair 2 x 5    05/16/22 Progress note 30 sec sit to stand x 2 2 MWT 73 ft   standing // bars Heel raises x 15 Marching 2 x 10 4" box toe taps x 15  Hip abduction x 10 each 4" step ups Right x 8, left x 4 Sidestepping // bars down and back x 2 with CGA  Sit to stand from chair 2 x 5     05/15/22 Seated: Heel/ toe raises x 20 3# LAQ's x 20 3# hip flexion x 20 Hip adduction with ball x 20 Hip abduction with green thera band x 20 Scapular retractions 2 x 10  Standing: // bars Marching x 10 Mini squats x 10 Sidestepping down and back x 1; cga for safety Heel raises x 20 4" box toe taps 2 x 10 each; CGA for safety    Nustep  (seat 7) lv 1 5 min  05/10/22 Seated: Heel/ toe raises x 20 3# LAQ's x 20 3# hip flexion x 20 Scapular retractions 2 x 10  Sidestepping in bars with 3 lb ankle weights  3 RT  Ambulation with RW and CGA with WC following 110 ft  Nustep  (seat 7) lv 1 4 min  9/14/263 STS from mat, standing with RW 2x 5 reps eccentric control. Gait training with RW WC behind CGA x 257f Supine: Bridge 2x 10 Standing:  heel raises 15x  Sidestep 3RT inside //bars  Minisquat front of WC with HHA 10x Seated hamstring stretch 2x 20" BLE on step  04/30/22 Seated: Heel/ toe raises x 20 3# LAQ's x 20 3# hip flexion x 20 Seated rows RTB x30 Sit to stand  2 x 10 with UE assist   Sidestepping in bars with 3 lb ankle weights  3 RT Heel raises x 10 Marching with 3 ob weights 2 x 10  Ambulation with RW and CGA with WC following 60 ft  PATIENT EDUCATION: Education details: on Eval findings, POC and HEP  Person educated: Patient and CTherapist, musicEducation method: Explanation Education comprehension:  verbalized understanding   HOME EXERCISE PROGRAM:  04/16/22 Quad sets  Access Code: [email or web address] URL: [email or web address] Date: 04/11/2022 Prepared by: [facility] - Rehab  Exercises - Seated March  - 3 x daily - 7 x weekly - 2 sets - 10 reps - Seated Long Arc Quad  - 3 x daily - 7 x weekly - 2 sets - 10 reps - Seated Heel Toe Raises  - 3 x daily - 7 x weekly - 2 sets - 10 reps - Seated Hip Adduction Squeeze with Ball  - 3 x daily - 7 x weekly - 2 sets - 10 reps - Seated Hip Abduction with Resistance  - 3 x daily - 7 x weekly - 2 sets - 10 reps  05/03/22:  Bridge   GOALS: SHORT TERM GOALS: Target date: 04/17/2022  Patient will be independent with initial HEP and self-management strategies to improve functional outcomes Baseline:  Goal status: met  LONG TERM GOALS: Target date: 07/13/2022  Patient will be independent with advanced HEP and self-management strategies to improve functional outcomes Baseline:  Goal status:ongoing  2.  Patient will report at least 65% overall improvement in subjective complaint to indicate improvement in ability to perform ADLs. Baseline: 05/16/22 "50%" better; 06/15/22 "50%" Goal status: ongoing  3.  Patient will be able to perform stand x 5 in < 30 seconds to demonstrate improvement in functional mobility and reduced risk for falls.  Baseline: 1 rep in 30 sec time ; 05/16/22 2 x in 30 sec; 06/15/22 3 x in 30 sec Goal status: ongoing  4. Patient will have equal to or 5/5 MMT throughout LLE to improve ability to perform functional mobility, stair ambulation and ADLs.  Baseline: See MMT Goal status: ongoing  5. Patient will be able to ambulate at least 200 feet during 2MWT with LRAD to demonstrate improved ability to perform functional mobility and associated tasks. Baseline: 90 feet with RW; 05/16/22 73 ft; 77 ft with RW Goal status: ongoing  ASSESSMENT:  CLINICAL IMPRESSION: Progress note today.  Patient with some improvement with sit to  stand today; mild improvement with 2 MWT; continues to walk with flexed knees, hips and trunk.  She and her caregiver report improvement at home with mobility so we will continue an additional 4 weeks with MD ok to try to work on achieving partially met and  unmet goals.   OBJECTIVE IMPAIRMENTS Abnormal gait, decreased activity tolerance, decreased balance, decreased endurance, decreased mobility, difficulty walking, decreased strength, and improper body mechanics.   ACTIVITY LIMITATIONS standing, transfers, and locomotion level  PARTICIPATION LIMITATIONS: community activity  PERSONAL FACTORS Age, Past/current experiences, and Time since onset of injury/illness/exacerbation are also affecting patient's functional outcome.   REHAB POTENTIAL: Good  CLINICAL DECISION MAKING: Stable/uncomplicated  EVALUATION COMPLEXITY: Low  PLAN: PT FREQUENCY: 2x/week  PT DURATION: 4 weeks  PLANNED INTERVENTIONS: Therapeutic exercises, Therapeutic activity, Neuromuscular re-education, Balance training, Gait training, Patient/Family education, Joint manipulation, Joint mobilization, Stair training, Aquatic Therapy, Dry Needling, Electrical stimulation, Spinal manipulation, Spinal mobilization, Cryotherapy, Moist heat, scar mobilization, Taping, Traction, Ultrasound, Biofeedback, Ionotophoresis 39m/ml Dexamethasone, and Manual therapy.   PLAN FOR NEXT SESSION: recommend extend PT 2 week 4 to address remaining unmet and partially met goals. Balance gait and strengthening  11:16 AM, 06/15/22 Demya Scruggs Small Alberto Schoch MPT [facility] #[phone]

## 2022-06-18 DIAGNOSIS — A403 Sepsis due to Streptococcus pneumoniae: Secondary | ICD-10-CM | POA: Diagnosis not present

## 2022-06-19 ENCOUNTER — Telehealth: Payer: Self-pay | Admitting: Family Medicine

## 2022-06-19 NOTE — Telephone Encounter (Signed)
Hydralazine will be her 1st fill with you.. ok to fill both?

## 2022-06-19 NOTE — Telephone Encounter (Signed)
Caller Name: Granville Lewis (caregiver) Call back phone #: [phone](home) & [phone] (cell)   MEDICATION(S):  hydrALAZINE , sitaGLIPtin(Januvia)   Days of Med Remaining: 1 week left for both  Has the patient contacted their pharmacy (YES/NO)? Yes What did pharmacy advise?   [facility] [address] in [place]  ~~~Please advise patient/caregiver to allow 2-3 business days to process RX refills.

## 2022-06-20 MED ORDER — SITAGLIPTIN PHOSPHATE 100 MG PO TABS: 100.0000 mg | ORAL_TABLET | Freq: Every day | ORAL | 2 refills | Status: DC

## 2022-06-20 MED ORDER — HYDRALAZINE HCL 25 MG PO TABS: 25.0000 mg | ORAL_TABLET | Freq: Three times a day (TID) | ORAL | 2 refills | Status: DC

## 2022-06-20 NOTE — Telephone Encounter (Signed)
Rx filled

## 2022-06-21 DIAGNOSIS — Z9622 Myringotomy tube(s) status: Secondary | ICD-10-CM | POA: Diagnosis not present

## 2022-06-21 DIAGNOSIS — H65492 Other chronic nonsuppurative otitis media, left ear: Secondary | ICD-10-CM | POA: Diagnosis not present

## 2022-06-23 ENCOUNTER — Other Ambulatory Visit: Payer: Self-pay | Admitting: Family Medicine

## 2022-06-26 NOTE — Telephone Encounter (Signed)
This will be her first fill with you.. ok to refill?

## 2022-06-27 ENCOUNTER — Other Ambulatory Visit: Payer: Self-pay | Admitting: Family Medicine

## 2022-06-27 DIAGNOSIS — R32 Unspecified urinary incontinence: Secondary | ICD-10-CM

## 2022-06-27 NOTE — Telephone Encounter (Signed)
Pt no longer pt of Dr. Dennard Schaumann  Requested Prescriptions  Refused Prescriptions Disp Refills   pioglitazone (ACTOS) 15 MG tablet [Pharmacy Med Name: PIOGLITAZONE HCL 15 MG TABLET] 90 tablet 1    Sig: TAKE 1 TABLET (15 MG TOTAL) BY MOUTH DAILY.     Endocrinology:  Diabetes - Glitazones - pioglitazone Failed - 06/27/2022 10:38 AM      Failed - Valid encounter within last 6 months    Recent Outpatient Visits           6 months ago Type 2 diabetes mellitus with hyperglycemia, without long-term current use of insulin ([place])   [place] Pickard, Cammie Mcgee, MD   8 months ago 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, without long-term current use of insulin, macular edema presence unspecified, unspecified retinopathy severity ([place])   [place] [name], MD   9 months ago Type 2 diabetes mellitus with hyperglycemia, without long-term current use of insulin ([place])   [place] [name], MD   11 months ago Mild persistent asthma with exacerbation   [place] [name], NP   12 months ago Mild persistent asthma with exacerbation   [place] Pickard, Cammie Mcgee, MD              Passed - HBA1C is between 0 and 7.9 and within 180 days    Hgb A1c MFr Bld  Date Value Ref Range Status  02/28/2022 7.9 (H) 4.6 - 6.5 % Final    Comment:    Glycemic Control Guidelines for People with Diabetes:Non Diabetic:  <6%Goal of Therapy: <7%Additional Action Suggested:  >8%           pioglitazone (ACTOS) 30 MG tablet [Pharmacy Med Name: PIOGLITAZONE HCL 30 MG TABLET] 90 tablet 3    Sig: TAKE 1 TABLET BY MOUTH EVERY DAY     Endocrinology:  Diabetes - Glitazones - pioglitazone Failed - 06/27/2022 10:38 AM      Failed - Valid encounter within last 6 months    Recent Outpatient Visits           6 months ago Type 2 diabetes mellitus with hyperglycemia, without long-term  current use of insulin ([place])   [place] Pickard, Cammie Mcgee, MD   8 months ago 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, without long-term current use of insulin, macular edema presence unspecified, unspecified retinopathy severity ([place])   [place] Pickard, Cammie Mcgee, MD   9 months ago Type 2 diabetes mellitus with hyperglycemia, without long-term current use of insulin ([place])   [place] [name], MD   11 months ago Mild persistent asthma with exacerbation   [place] [name], NP   12 months ago Mild persistent asthma with exacerbation   [place] Pickard, Cammie Mcgee, MD              Passed - HBA1C is between 0 and 7.9 and within 180 days    Hgb A1c MFr Bld  Date Value Ref Range Status  02/28/2022 7.9 (H) 4.6 - 6.5 % Final    Comment:    Glycemic Control Guidelines for People with Diabetes:Non Diabetic:  <6%Goal of Therapy: <7%Additional Action Suggested:  >8%

## 2022-06-29 ENCOUNTER — Telehealth: Payer: Self-pay | Admitting: Family Medicine

## 2022-06-29 NOTE — Telephone Encounter (Signed)
Received eFax for med refill; called patient to verify her PCP has been changed from Dr. Dennard Schaumann to NP Harland Dingwall before discarding it.   Phone number of patient's caregiver Granville Lewis has a full mailbox; sent sms text. Number for alt contact hasn't set up mailbox yet.   Awaiting call back.

## 2022-07-02 ENCOUNTER — Encounter: Payer: Self-pay | Admitting: Family Medicine

## 2022-07-04 ENCOUNTER — Ambulatory Visit (INDEPENDENT_AMBULATORY_CARE_PROVIDER_SITE_OTHER): Payer: Medicare HMO | Admitting: Family Medicine

## 2022-07-04 ENCOUNTER — Other Ambulatory Visit: Payer: Self-pay | Admitting: Family Medicine

## 2022-07-04 ENCOUNTER — Ambulatory Visit (INDEPENDENT_AMBULATORY_CARE_PROVIDER_SITE_OTHER): Payer: Medicare HMO

## 2022-07-04 ENCOUNTER — Encounter: Payer: Self-pay | Admitting: Family Medicine

## 2022-07-04 VITALS — BP 142/70 | HR 55 | Temp 97.6°F | Ht 63.0 in

## 2022-07-04 DIAGNOSIS — R531 Weakness: Secondary | ICD-10-CM

## 2022-07-04 DIAGNOSIS — I5032 Chronic diastolic (congestive) heart failure: Secondary | ICD-10-CM

## 2022-07-04 DIAGNOSIS — R059 Cough, unspecified: Secondary | ICD-10-CM | POA: Diagnosis not present

## 2022-07-04 DIAGNOSIS — N184 Chronic kidney disease, stage 4 (severe): Secondary | ICD-10-CM | POA: Diagnosis not present

## 2022-07-04 DIAGNOSIS — J9811 Atelectasis: Secondary | ICD-10-CM | POA: Diagnosis not present

## 2022-07-04 DIAGNOSIS — R053 Chronic cough: Secondary | ICD-10-CM

## 2022-07-04 DIAGNOSIS — J9611 Chronic respiratory failure with hypoxia: Secondary | ICD-10-CM

## 2022-07-04 DIAGNOSIS — N390 Urinary tract infection, site not specified: Secondary | ICD-10-CM | POA: Diagnosis not present

## 2022-07-04 DIAGNOSIS — R5383 Other fatigue: Secondary | ICD-10-CM | POA: Diagnosis not present

## 2022-07-04 DIAGNOSIS — E538 Deficiency of other specified B group vitamins: Secondary | ICD-10-CM

## 2022-07-04 DIAGNOSIS — E11319 Type 2 diabetes mellitus with unspecified diabetic retinopathy without macular edema: Secondary | ICD-10-CM

## 2022-07-04 DIAGNOSIS — N3000 Acute cystitis without hematuria: Secondary | ICD-10-CM

## 2022-07-04 LAB — URINALYSIS, ROUTINE W REFLEX MICROSCOPIC
Bilirubin Urine: NEGATIVE
Hgb urine dipstick: NEGATIVE
Ketones, ur: NEGATIVE
Nitrite: POSITIVE — AB
RBC / HPF: NONE SEEN (ref 0–?)
Specific Gravity, Urine: 1.02 (ref 1.000–1.030)
Total Protein, Urine: NEGATIVE
Urine Glucose: NEGATIVE
Urobilinogen, UA: 0.2 (ref 0.0–1.0)
pH: 6 (ref 5.0–8.0)

## 2022-07-04 LAB — CBC WITH DIFFERENTIAL/PLATELET
Basophils Absolute: 0.1 10*3/uL (ref 0.0–0.1)
Basophils Relative: 1.3 % (ref 0.0–3.0)
Eosinophils Absolute: 0.3 10*3/uL (ref 0.0–0.7)
Eosinophils Relative: 6.5 % — ABNORMAL HIGH (ref 0.0–5.0)
HCT: 32.2 % — ABNORMAL LOW (ref 36.0–46.0)
Hemoglobin: 10.5 g/dL — ABNORMAL LOW (ref 12.0–15.0)
Lymphocytes Relative: 19.9 % (ref 12.0–46.0)
Lymphs Abs: 0.9 10*3/uL (ref 0.7–4.0)
MCHC: 32.5 g/dL (ref 30.0–36.0)
MCV: 88 fl (ref 78.0–100.0)
Monocytes Absolute: 0.4 10*3/uL (ref 0.1–1.0)
Monocytes Relative: 8.2 % (ref 3.0–12.0)
Neutro Abs: 2.9 10*3/uL (ref 1.4–7.7)
Neutrophils Relative %: 64.1 % (ref 43.0–77.0)
Platelets: 152 10*3/uL (ref 150.0–400.0)
RBC: 3.66 Mil/uL — ABNORMAL LOW (ref 3.87–5.11)
RDW: 14.9 % (ref 11.5–15.5)
WBC: 4.6 10*3/uL (ref 4.0–10.5)

## 2022-07-04 LAB — VITAMIN B12: Vitamin B-12: >1500 pg/mL — ABNORMAL HIGH (ref 211–911)

## 2022-07-04 LAB — COMPREHENSIVE METABOLIC PANEL
ALT: 10 U/L (ref 0–35)
AST: 14 U/L (ref 0–37)
Albumin: 3.5 g/dL (ref 3.5–5.2)
Alkaline Phosphatase: 67 U/L (ref 39–117)
BUN: 22 mg/dL (ref 6–23)
CO2: 32 mEq/L (ref 19–32)
Calcium: 8.5 mg/dL (ref 8.4–10.5)
Chloride: 103 mEq/L (ref 96–112)
Creatinine, Ser: 1.38 mg/dL — ABNORMAL HIGH (ref 0.40–1.20)
GFR: 33.51 mL/min — ABNORMAL LOW (ref >60.00)
Glucose, Bld: 242 mg/dL — ABNORMAL HIGH (ref 70–99)
Potassium: 4.2 mEq/L (ref 3.5–5.1)
Sodium: 139 mEq/L (ref 135–145)
Total Bilirubin: 0.3 mg/dL (ref 0.2–1.2)
Total Protein: 5.6 g/dL — ABNORMAL LOW (ref 6.0–8.3)

## 2022-07-04 LAB — VITAMIN D 25 HYDROXY (VIT D DEFICIENCY, FRACTURES): VITD: 68.92 ng/mL (ref 30.00–100.00)

## 2022-07-04 LAB — BRAIN NATRIURETIC PEPTIDE: Pro B Natriuretic peptide (BNP): 161 pg/mL — ABNORMAL HIGH (ref 0.0–100.0)

## 2022-07-04 LAB — HEMOGLOBIN A1C: Hgb A1c MFr Bld: 6.4 % (ref 4.6–6.5)

## 2022-07-04 LAB — TSH: TSH: 2.45 u[IU]/mL (ref 0.35–5.50)

## 2022-07-04 MED ORDER — CEPHALEXIN 500 MG PO CAPS: 500.0000 mg | ORAL_CAPSULE | Freq: Two times a day (BID) | ORAL | 0 refills | Status: DC

## 2022-07-04 NOTE — Assessment & Plan Note (Signed)
Check Hgb A1c and other labs today. No hypoglycemia per caregiver.

## 2022-07-04 NOTE — Assessment & Plan Note (Signed)
Check B12 level and follow up

## 2022-07-04 NOTE — Assessment & Plan Note (Signed)
UA and culture ordered today. Asymptomatic.

## 2022-07-04 NOTE — Assessment & Plan Note (Signed)
No obvious explanation on exam. No acute distress. Denis pain.  No sign of infection. I will send her for a stat CXR and CBC along with other labs. UA ordered, no urinary symptoms. Follow up pending results and if she has any new or worsening symptoms.

## 2022-07-04 NOTE — Assessment & Plan Note (Addendum)
Appears euvolemic today. CXR and BNP ordered

## 2022-07-04 NOTE — Assessment & Plan Note (Signed)
Check renal function. 

## 2022-07-04 NOTE — Progress Notes (Signed)
Subjective:     Patient ID: Renee [name], female    DOB: Sep 10, 1928, 88 y.o.   MRN: [id number]  Chief Complaint  Patient presents with   Fatigue    Been feeling really weak for past couple weeks, noticed that her walking has started to become slower but can't contribute it to anything has her eating has been good    HPI Patient is in today for a 2 wk hx of generalized weakness and fatigue. Her caregiver is with her.  States she has been walking slower at home and moving around less. Uses a walker at home some. In a wheelchair for long distances.   Does not eat meat due to issues with chewing.  No blood in stool. Would like iron level checked.   Denies any low blood sugars. Appetite is fine and drinking normally.  .  Reports having her usual cough.  Wears oxygen at night.   Hx of sepsis due to UTI, PNA  Denies fever, chills, dizziness, headache, chest pain, palpitations, shortness of breath, abdominal pain, N/V/D, urinary symptoms, LE edema.    Taking a multi-vitamin   Health Maintenance Due  Topic Date Due   MAMMOGRAM  08/02/2021   COVID-19 Vaccine (4 - Pfizer series) 10/16/2021    Past Medical History:  Diagnosis Date   Angioedema    possibly from voltaren   Bronchopneumonia 12/11/2016   Degenerative disc disease    Diabetes mellitus    type II   Hyperlipidemia    Hypertension    LVH (left ventricular hypertrophy)    and atrial enlargement by echo in past with nl EF   Nasal pruritis    Osteoarthritis    Osteopenia    Renal insufficiency    Sleep apnea    Stroke ([place]) 05/2010   Small vessel sobcortical (in [facility] trial) with Dr Leonie Man, residual L hemiparesis   Vitamin B 12 deficiency 04/08    Past Surgical History:  Procedure Laterality Date   ABDOMINAL HYSTERECTOMY     BSO-fibroids   APPENDECTOMY     BACK SURGERY     COLON SURGERY     due to punctured intestines   EYE SURGERY     cataract extraction   IR THORACENTESIS ASP PLEURAL SPACE W/IMG  GUIDE  06/07/2020   KNEE SURGERY     arthroscope   PARS PLANA VITRECTOMY  07/31/2011   Procedure: PARS PLANA VITRECTOMY WITH 25 GAUGE;  Surgeon: [name], MD;  Location: [facility];  Service: Ophthalmology;  Laterality: Right;  REMOVAL OF SILICONE OIL AND LASER RIGHT EYE   RETINAL DETACHMENT SURGERY  02/18/11   times 2   SPINE SURGERY  08/09   spinal decompression surgery    Family History  Problem Relation Age of Onset   COPD Brother    Cancer Sister        brain tumor with hemmorhage   Heart disease Sister        CAD    Social History   Socioeconomic History   Marital status: Widowed    Spouse name: Not on file   Number of children: Not on file   Years of education: Not on file   Highest education level: Not on file  Occupational History   Not on file  Tobacco Use   Smoking status: Never    Passive exposure: Never   Smokeless tobacco: Never  Vaping Use   Vaping Use: Not on file  Substance and Sexual Activity  Alcohol use: No    Alcohol/week: 0.0 standard drinks of alcohol   Drug use: No   Sexual activity: Never  Other Topics Concern   Not on file  Social History Narrative   Not on file   Social Determinants of Health   Financial Resource Strain: Low Risk  (05/30/2022)   Overall Financial Resource Strain (CARDIA)    Difficulty of Paying Living Expenses: Not hard at all  Food Insecurity: No Food Insecurity (05/30/2022)   Hunger Vital Sign    Worried About Running Out of Food in the Last Year: Never true    Ran Out of Food in the Last Year: Never true  Transportation Needs: No Transportation Needs (05/30/2022)   PRAPARE - Hydrologist (Medical): No    Lack of Transportation (Non-Medical): No  Physical Activity: Inactive (05/30/2022)   Exercise Vital Sign    Days of Exercise per Week: 0 days    Minutes of Exercise per Session: 0 min  Stress: No Stress Concern Present (05/30/2022)   [place]    Feeling of Stress : Not at all  Social Connections: Unknown (05/30/2022)   Social Connection and Isolation Panel [NHANES]    Frequency of Communication with Friends and Family: More than three times a week    Frequency of Social Gatherings with Friends and Family: Twice a week    Attends Religious Services: Never    Marine scientist or Organizations: Yes    Attends Music therapist: More than 4 times per year    Marital Status: Patient refused  Intimate Partner Violence: Not At Risk (05/30/2022)   Humiliation, Afraid, Rape, and Kick questionnaire    Fear of Current or Ex-Partner: No    Emotionally Abused: No    Physically Abused: No    Sexually Abused: No    Outpatient Medications Prior to Visit  Medication Sig Dispense Refill   acetaminophen (TYLENOL) 500 MG tablet Take 1,000 mg by mouth at bedtime.     albuterol (PROVENTIL) (2.5 MG/3ML) 0.083% nebulizer solution Take 3 mLs (2.5 mg total) by nebulization every 6 (six) hours as needed for wheezing or shortness of breath. 75 mL 5   albuterol (VENTOLIN HFA) 108 (90 Base) MCG/ACT inhaler INHALE 2 PUFFS INTO THE LUNGS IN THE MORNING, AT NOON, AND AT BEDTIME. 6.7 each 1   amLODipine (NORVASC) 10 MG tablet TAKE 1 TABLET BY MOUTH EVERY DAY (Patient taking differently: Take 10 mg by mouth daily.) 90 tablet 3   aspirin EC 325 MG tablet Take 325 mg by mouth in the morning.     budesonide-formoterol (SYMBICORT) 160-4.5 MCG/ACT inhaler Inhale 2 puffs into the lungs 2 (two) times daily. 10.2 g 1   calcium-vitamin D (OSCAL WITH D) 500-200 MG-UNIT per tablet Take 1 tablet by mouth daily.     Cholecalciferol (VITAMIN D-3) 25 MCG (1000 UT) CAPS Take 1,000 Units by mouth in the morning.     Cranberry 500 MG TABS Take 500 mg by mouth in the morning and at bedtime.     CRANBERRY PO Take by mouth.     docusate sodium (COLACE) 100 MG capsule Take 200 mg by mouth at bedtime.     estradiol (ESTRACE) 0.1  MG/GM vaginal cream Place 1 g vaginally daily.     FLONASE 50 MCG/ACT nasal spray Place 2 sprays into both nostrils See admin instructions. Instill 2 sprays into each nostril at bedtime  furosemide (LASIX) 20 MG tablet TAKE 1/2 TABLET BY MOUTH EVERY DAY 45 tablet 0   glipiZIDE (GLUCOTROL XL) 5 MG 24 hr tablet Take 1 tablet (5 mg total) by mouth daily with breakfast. 30 tablet 2   glucose blood (ONETOUCH ULTRA) test strip CHECK BLOOD SUGAR ONCE DAILY AND AS DIRECTED. 100 strip 11   hydrALAZINE (APRESOLINE) 25 MG tablet Take 1 tablet (25 mg total) by mouth every 8 (eight) hours. 90 tablet 2   hydrochlorothiazide (MICROZIDE) 12.5 MG capsule Take 1 capsule (12.5 mg total) by mouth daily. 30 capsule 2   Lancets (ONETOUCH DELICA PLUS [id number]) MISC CHECK BLOOD SUGAR ONCE DAILY AND AS DIRECTED 100 each 2   loratadine (CLARITIN) 10 MG tablet Take 1 tablet (10 mg total) by mouth daily. 30 tablet 1   metoprolol succinate (TOPROL-XL) 25 MG 24 hr tablet Take 12.5 mg by mouth 2 (two) times daily.     mirtazapine (REMERON) 15 MG tablet TAKE 1 TABLET BY MOUTH EVERYDAY AT BEDTIME 90 tablet 0   Multiple Vitamin (MULTIVITAMIN) capsule Take 1 capsule by mouth daily.     Multiple Vitamins-Minerals (PRESERVISION AREDS 2+MULTI VIT) CAPS Take 1 capsule by mouth in the morning and at bedtime.     Olopatadine HCl (PATADAY OP) Place 1 drop into both eyes 2 (two) times daily as needed (for itching or redness).     pantoprazole (PROTONIX) 40 MG tablet Take 1 tablet (40 mg total) by mouth daily at 6 (six) AM. 30 tablet 1   pioglitazone (ACTOS) 30 MG tablet Take 1 tablet (30 mg total) by mouth daily. 90 tablet 3   sertraline (ZOLOFT) 50 MG tablet Take 1 tablet (50 mg total) by mouth daily. 90 tablet 1   sitaGLIPtin (JANUVIA) 100 MG tablet Take 1 tablet (100 mg total) by mouth daily. 30 tablet 2   solifenacin (VESICARE) 10 MG tablet TAKE 1 TABLET BY MOUTH EVERY DAY 30 tablet 2   Spacer/Aero-Holding Chambers DEVI 1 each by  Does not apply route at bedtime. Use with inhaler. 1 each 0   trandolapril (MAVIK) 4 MG tablet TAKE 1 TABLET DAILY (Patient taking differently: Take 4 mg by mouth daily.) 90 tablet 3   triamcinolone (NASACORT) 55 MCG/ACT AERO nasal inhaler Place 2 sprays into the nose daily. (Patient taking differently: Place 2 sprays into the nose See admin instructions. Instill 2 sprays into each nostril at bedtime when not using Flonase) 1 each 5   vitamin B-12 (CYANOCOBALAMIN) 1000 MCG tablet Take 1,000 mcg by mouth daily.      No facility-administered medications prior to visit.    Allergies  Allergen Reactions   Tape Other (See Comments)    The patient's skin is VERY THIN- will TEAR EASILY!!   Voltaren [Diclofenac] Swelling and Other (See Comments)    Angioedema    ROS     Objective:    Physical Exam Constitutional:      General: She is not in acute distress.    Appearance: She is not ill-appearing.  HENT:     Nose: Nose normal.     Mouth/Throat:     Mouth: Mucous membranes are moist.     Pharynx: Oropharynx is clear.  Eyes:     Extraocular Movements: Extraocular movements intact.     Conjunctiva/sclera: Conjunctivae normal.  Cardiovascular:     Rate and Rhythm: Normal rate and regular rhythm.     Pulses: Normal pulses.  Pulmonary:     Effort: Pulmonary effort is normal.  Breath sounds: Examination of the right-lower field reveals decreased breath sounds. Examination of the left-lower field reveals decreased breath sounds. Decreased breath sounds present. No wheezing or rhonchi.  Abdominal:     General: There is no distension.     Palpations: Abdomen is soft.     Tenderness: There is no abdominal tenderness.  Musculoskeletal:     Cervical back: Normal range of motion and neck supple.     Right lower leg: No edema.     Left lower leg: No edema.  Lymphadenopathy:     Cervical: No cervical adenopathy.  Skin:    General: Skin is warm and dry.  Neurological:     General: No  focal deficit present.     Mental Status: She is alert. Mental status is at baseline.     Cranial Nerves: No facial asymmetry.     Sensory: No sensory deficit.     Motor: No weakness or pronator drift.  Psychiatric:        Mood and Affect: Mood normal.        Speech: Speech normal.        Behavior: Behavior normal.     BP (!) 142/70 (BP Location: Left Arm, Patient Position: Sitting, Cuff Size: Large)   Pulse (!) 55   Temp 97.6 F (36.4 C) (Temporal)   Ht '5\' 3"'$  (1.6 m)   SpO2 97%   BMI 30.65 kg/m  Wt Readings from Last 3 Encounters:  03/23/22 173 lb (78.5 kg)  02/28/22 173 lb 4 oz (78.6 kg)  02/09/22 173 lb (78.5 kg)       Assessment & Plan:   Problem List Items Addressed This Visit       Cardiovascular and Mediastinum   Chronic diastolic CHF (congestive heart failure) ([place])    Appears euvolemic today. CXR and BNP ordered      Relevant Orders   Brain natriuretic peptide (Completed)   DG Chest 2 View (Completed)     Respiratory   Chronic respiratory failure with hypoxia ([facility])    On oxygen at night. No changes with breathing. Chronic cough present.         Endocrine   Type 2 diabetes, controlled, with retinopathy ([place])    Check Hgb A1c and other labs today. No hypoglycemia per caregiver.       Relevant Orders   Hemoglobin A1c     Genitourinary   CKD (chronic kidney disease) stage 4, GFR 15-29 ml/min ([facility])    Check renal function      Relevant Orders   Comprehensive metabolic panel (Completed)   Frequent UTI    UA and culture ordered today. Asymptomatic.       Relevant Orders   Urinalysis, Routine w reflex microscopic   Urine Culture     Other   B12 deficiency    Check B12 level and follow up      Relevant Orders   Vitamin B12   Fatigue - Primary    No obvious explanation on exam. No acute distress. Denis pain.  No sign of infection. I will send her for a stat CXR and CBC along with other labs. UA ordered, no urinary symptoms. Follow up  pending results and if she has any new or worsening symptoms.       Relevant Orders   CBC with Differential/Platelet (Completed)   Comprehensive metabolic panel (Completed)   TSH   VITAMIN D 25 Hydroxy (Vit-D Deficiency, Fractures)   Urinalysis, Routine w reflex microscopic  Urine Culture   Iron, TIBC and Ferritin Panel   Brain natriuretic peptide (Completed)   DG Chest 2 View (Completed)   Generalized weakness    No obvious explanation on exam. No acute distress. Denis pain.  No sign of infection. I will send her for a stat CXR and CBC along with other labs. UA ordered, no urinary symptoms. Follow up pending results and if she has any new or worsening symptoms.       Relevant Orders   CBC with Differential/Platelet (Completed)   Comprehensive metabolic panel (Completed)   TSH   VITAMIN D 25 Hydroxy (Vit-D Deficiency, Fractures)   Urinalysis, Routine w reflex microscopic   Urine Culture   Iron, TIBC and Ferritin Panel   Brain natriuretic peptide (Completed)   DG Chest 2 View (Completed)   Other Visit Diagnoses     Chronic cough       Relevant Orders   DG Chest 2 View (Completed)      Visit time 30 minutes in face to face communication with patient and coordination of care, additional 20 minutes spent in record review, coordination or care, ordering tests, communicating/referring to other healthcare professionals, documenting in medical records all on the same day of the visit for total time 50 minutes spent on the visit.    I am having Cristy A. Bellavance maintain her multivitamin, calcium-vitamin D, docusate sodium, cyanocobalamin, Olopatadine HCl (PATADAY OP), triamcinolone, PreserVision AREDS 2+Multi Vit, Cranberry, acetaminophen, aspirin EC, Vitamin D-3, Flonase, estradiol, OneTouch Delica Plus [id number], pioglitazone, metoprolol succinate, OneTouch Ultra, trandolapril, amLODipine, sertraline, pantoprazole, loratadine, budesonide-formoterol, albuterol, Spacer/Aero-Holding  Chambers, CRANBERRY PO, mirtazapine, albuterol, hydrochlorothiazide, glipiZIDE, solifenacin, hydrALAZINE, sitaGLIPtin, and furosemide.  No orders of the defined types were placed in this encounter.

## 2022-07-04 NOTE — Patient Instructions (Signed)
Please go downstairs for labs, chest X ray and a urine test.   Let me know if you have any new or worsening symptoms.   We will be in touch with your results.

## 2022-07-04 NOTE — Assessment & Plan Note (Signed)
On oxygen at night. No changes with breathing. Chronic cough present.

## 2022-07-06 ENCOUNTER — Encounter: Payer: Self-pay | Admitting: Urology

## 2022-07-06 ENCOUNTER — Ambulatory Visit: Payer: Medicare HMO | Admitting: Urology

## 2022-07-06 VITALS — BP 150/76 | HR 55 | Ht 63.0 in | Wt 173.0 lb

## 2022-07-06 DIAGNOSIS — Z87440 Personal history of urinary (tract) infections: Secondary | ICD-10-CM | POA: Diagnosis not present

## 2022-07-06 DIAGNOSIS — R32 Unspecified urinary incontinence: Secondary | ICD-10-CM | POA: Diagnosis not present

## 2022-07-06 DIAGNOSIS — N390 Urinary tract infection, site not specified: Secondary | ICD-10-CM

## 2022-07-06 NOTE — Progress Notes (Signed)
Being   Assessment: 1. Frequent UTI   2. Urinary incontinence, unspecified type    Plan: Await results of recent urine culture  Continue methods to reduce the risk of UTIs discussed including increased fluid intake, timed and double voiding, daily cranberry supplement, and daily probiotics. Continue vaginal hormone replacement with Estrace. I discussed possible use of methenamine for UTI prevention.  Unfortunately, due to her renal insufficiency, this medicine is contraindicated. Consider restarting daily cephalexin for UTI prevention. Continue solifenacin 10 mg daily Return to office in 6 weeks  Chief Complaint:  Chief Complaint  Patient presents with   Recurrent UTI    History of Present Illness:  Renee [name] is a 88 y.o. year old female who is seen for further evaluation of frequent UTIs.  She has a history of frequent urinary tract infections.  She does not have typical UTI symptoms.  Her UTIs typically present with generalized malaise and confusion.  No dysuria or gross hematuria.  No flank pain, fever, or chills.  Urine culture results: 1/22 mixed flora 2/22 Klebsiella 3/22 <10K colonies 4/22 Citrobacter 5/22 no growth 7/22 multiple species 9/22 E. coli  She has been on daily cephalexin for >1-year for UTI prevention. She was not having any UTI symptoms at her visit in July 2023. She has urinary incontinence.  She has been managed with Vesicare 10 mg daily.  Her daytime symptoms have been fairly well controlled.  She continued to have incontinence at night.  She has previously seen Dr. Abner Greenspan at [facility].  The daily cephalexin was discontinued in July 2023. At her visit in 8/23, she had been off the cephalexin for 1 month.  She was not having any current UTI symptoms.  No dysuria or gross hematuria.  She  noted a strong odor to her urine.  No fevers or chills.  She continued on Vesicare 10 mg daily.  Her incontinence symptoms were unchanged. Urine culture from  8/23 grew >100 K Klebsiella.  She was treated with cefdinir for 7 days. She was recently seen by her PCP on 07/04/2022 and started on cephalexin for possible UTI.  She was having some generalized malaise.  No dysuria or gross hematuria.  Urinalysis was nitrite positive with many bacteria.  Urine culture is pending.  She presents today for follow-up.  She continues on cephalexin.  She reports that she is feeling better.  No UTI symptoms today.  Portions of the above documentation were copied from a prior visit for review purposes only.   Past Medical History:  Past Medical History:  Diagnosis Date   Angioedema    possibly from voltaren   Bronchopneumonia 12/11/2016   Degenerative disc disease    Diabetes mellitus    type II   Hyperlipidemia    Hypertension    LVH (left ventricular hypertrophy)    and atrial enlargement by echo in past with nl EF   Nasal pruritis    Osteoarthritis    Osteopenia    Renal insufficiency    Sleep apnea    Stroke ([place]) 05/2010   Small vessel sobcortical (in [facility] trial) with Dr Leonie Man, residual L hemiparesis   Vitamin B 12 deficiency 04/08    Past Surgical History:  Past Surgical History:  Procedure Laterality Date   ABDOMINAL HYSTERECTOMY     BSO-fibroids   APPENDECTOMY     BACK SURGERY     COLON SURGERY     due to punctured intestines   EYE SURGERY  cataract extraction   IR THORACENTESIS ASP PLEURAL SPACE W/IMG GUIDE  06/07/2020   KNEE SURGERY     arthroscope   PARS PLANA VITRECTOMY  07/31/2011   Procedure: PARS PLANA VITRECTOMY WITH 25 GAUGE;  Surgeon: [name], MD;  Location: [facility];  Service: Ophthalmology;  Laterality: Right;  REMOVAL OF SILICONE OIL AND LASER RIGHT EYE   RETINAL DETACHMENT SURGERY  02/18/11   times 2   SPINE SURGERY  08/09   spinal decompression surgery    Allergies:  Allergies  Allergen Reactions   Tape Other (See Comments)    The patient's skin is VERY THIN- will TEAR EASILY!!   Voltaren [Diclofenac]  Swelling and Other (See Comments)    Angioedema    Family History:  Family History  Problem Relation Age of Onset   COPD Brother    Cancer Sister        brain tumor with hemmorhage   Heart disease Sister        CAD    Social History:  Social History   Tobacco Use   Smoking status: Never    Passive exposure: Never   Smokeless tobacco: Never  Substance Use Topics   Alcohol use: No    Alcohol/week: 0.0 standard drinks of alcohol   Drug use: No    ROS: Constitutional:  Negative for fever, chills, weight loss CV: Negative for chest pain, previous MI, hypertension Respiratory:  Negative for shortness of breath, wheezing, sleep apnea, frequent cough GI:  Negative for nausea, vomiting, bloody stool, GERD  Physical exam: BP (!) 150/76   Pulse (!) 55   Ht '5\' 3"'$  (1.6 m)   Wt 173 lb (78.5 kg)   BMI 30.65 kg/m  GENERAL APPEARANCE:  Well appearing, well developed, well nourished, NAD HEENT:  Atraumatic, normocephalic, oropharynx clear NECK:  Supple without lymphadenopathy or thyromegaly ABDOMEN:  Soft, non-tender, no masses EXTREMITIES:  Without clubbing, cyanosis, or edema NEUROLOGIC:  Alert and oriented x 3, in wheelchair, CN II-XII grossly intact MENTAL STATUS:  appropriate BACK:  Non-tender to palpation, No CVAT SKIN:  Warm, dry, and intact  Results: No sample provided

## 2022-07-07 LAB — URINE CULTURE

## 2022-07-07 LAB — IRON,TIBC AND FERRITIN PANEL
%SAT: 24 % (calc) (ref 16–45)
Ferritin: 17 ng/mL (ref 16–288)
Iron: 60 ug/dL (ref 45–160)
TIBC: 245 mcg/dL (calc) — ABNORMAL LOW (ref 250–450)

## 2022-07-10 ENCOUNTER — Telehealth: Payer: Self-pay

## 2022-07-10 ENCOUNTER — Other Ambulatory Visit: Payer: Self-pay | Admitting: Urology

## 2022-07-10 DIAGNOSIS — N390 Urinary tract infection, site not specified: Secondary | ICD-10-CM

## 2022-07-10 MED ORDER — CEPHALEXIN 250 MG PO TABS: 250.0000 mg | ORAL_TABLET | Freq: Every day | ORAL | 2 refills | Status: DC

## 2022-07-10 NOTE — Telephone Encounter (Signed)
-----   Message from Primus Bravo, MD sent at 07/10/2022 10:29 AM EST ----- Please notify patient to complete Keflex as prescribed for her UTI. She should then restart daily Keflex for UTI prevention.  Rx sent.  Return to office in 6 weeks as scheduled.

## 2022-07-10 NOTE — Telephone Encounter (Signed)
Opened in error

## 2022-07-10 NOTE — Telephone Encounter (Signed)
Left vm for pt to return call for results.  Waiting for call back.

## 2022-07-11 NOTE — Telephone Encounter (Signed)
Made family member Granville Lewis aware that patient need to complete Keflex prescription for her UTI and patient will need to start a daily Keflex for UTI prevention after completing first course of Keflex, rx was sent to pharmacy. Family  member Granville Lewis voiced understanding

## 2022-07-14 ENCOUNTER — Ambulatory Visit
Admission: EM | Admit: 2022-07-14 | Discharge: 2022-07-14 | Disposition: A | Discharge status: Home / Self Care | Payer: Medicare HMO | Attending: Nurse Practitioner | Admitting: Nurse Practitioner

## 2022-07-14 ENCOUNTER — Ambulatory Visit (INDEPENDENT_AMBULATORY_CARE_PROVIDER_SITE_OTHER): Payer: Medicare HMO

## 2022-07-14 ENCOUNTER — Encounter: Payer: Self-pay | Admitting: Emergency Medicine

## 2022-07-14 DIAGNOSIS — J9811 Atelectasis: Secondary | ICD-10-CM

## 2022-07-14 DIAGNOSIS — R059 Cough, unspecified: Secondary | ICD-10-CM | POA: Diagnosis not present

## 2022-07-14 MED ORDER — AZITHROMYCIN 250 MG PO TABS: 250.0000 mg | ORAL_TABLET | Freq: Every day | ORAL | 0 refills | Status: DC

## 2022-07-14 NOTE — ED Provider Notes (Signed)
[facility]    CSN: [id number] Arrival date & time: 07/14/22  1203      History   Chief Complaint No chief complaint on file.   HPI Renee [name] is a 88 y.o. female.   The history is provided by a caregiver.   Patient brought in by her family member for complaints of cough and sore throat.  Patient's family member states patient had a coughing fit a few days ago, and since that time, she has had a "rattle in her chest, and a sore throat.  Patient's family member denies fever, chills, wheezing, difficulty breathing, chest pain, or GI symptoms.  Patient's caregiver states that she has been administering Mucinex for the cough.  She also states that patient has a history of pneumonia and states that the last time she had the same or similar symptoms, she ended up having pneumonia.  Past Medical History:  Diagnosis Date   Angioedema    possibly from voltaren   Bronchopneumonia 12/11/2016   Degenerative disc disease    Diabetes mellitus    type II   Hyperlipidemia    Hypertension    LVH (left ventricular hypertrophy)    and atrial enlargement by echo in past with nl EF   Nasal pruritis    Osteoarthritis    Osteopenia    Renal insufficiency    Sleep apnea    Stroke ([place]) 05/2010   Small vessel sobcortical (in [place]) with Dr Leonie Man, residual L hemiparesis   Vitamin B 12 deficiency 04/08    Patient Active Problem List   Diagnosis Date Noted   Fatigue 07/04/2022   History of UTI 02/28/2022   Physical deconditioning 01/19/2022   Chronic respiratory failure with hypoxia ([place]) 01/19/2022   Asthma, chronic, unspecified asthma severity, with acute exacerbation 01/06/2022   Allergic rhinitis 01/06/2022   Chronic diastolic CHF (congestive heart failure) ([place]) 01/06/2022   Obesity (BMI 30-39.9) 01/06/2022   Acute respiratory failure with hypoxia ([place]) 01/05/2022   Bradycardia 09/01/2021   UTI (urinary tract infection) 08/29/2021   Wheezing on expiration  08/29/2021   Sepsis due to pneumonia ([place]) 06/07/2020   Pleural effusion 05/13/2020   Frequent UTI 05/13/2020   Dizziness 02/12/2020   Normocytic anemia 02/12/2020   Fall involving sidewalk curb 11/29/2019   Contusion of back 11/27/2019   Aortic atherosclerosis ([place]) 07/13/2019   CKD (chronic kidney disease) stage 4, GFR 15-29 ml/min (HCC) 07/01/2019   External hemorrhoid 01/17/2018   History of colitis 01/17/2018   Generalized weakness 12/24/2016   Diabetic retinopathy ([place]) 12/11/2016   History of CVA (cerebrovascular accident) 12/11/2016   Weakness secondary to UTI 10/21/2015   Acute renal failure superimposed on stage 3b chronic kidney disease ([place]) 10/12/2015   CAP (community acquired pneumonia) 10/07/2015   Estrogen deficiency 08/30/2015   Mobility impaired 06/26/2011   History of retinal detachment 01/10/2011   Sleep apnea 11/28/2010   Anxiety and depression 08/25/2010   Hemiplegia, late effect of cerebrovascular disease ([place]) 07/05/2010   POSTHERPETIC NEURALGIA 11/09/2009   Renal insufficiency 06/29/2008   Chronic back pain 01/26/2008   EDEMA 01/26/2008   B12 deficiency 01/10/2007   Type 2 diabetes, controlled, with retinopathy ([place]) 11/27/2006   Essential hypertension 11/27/2006   FIBROCYSTIC BREAST DISEASE 11/27/2006   ROSACEA 11/27/2006   OSTEOARTHRITIS 11/27/2006   Urinary incontinence 11/27/2006    Past Surgical History:  Procedure Laterality Date   ABDOMINAL HYSTERECTOMY     BSO-fibroids   APPENDECTOMY     BACK  SURGERY     COLON SURGERY     due to punctured intestines   EYE SURGERY     cataract extraction   IR THORACENTESIS ASP PLEURAL SPACE W/IMG GUIDE  06/07/2020   KNEE SURGERY     arthroscope   PARS PLANA VITRECTOMY  07/31/2011   Procedure: PARS PLANA VITRECTOMY WITH 25 GAUGE;  Surgeon: [name], MD;  Location: [facility];  Service: Ophthalmology;  Laterality: Right;  REMOVAL OF SILICONE OIL AND LASER RIGHT EYE   RETINAL DETACHMENT SURGERY   02/18/11   times 2   SPINE SURGERY  08/09   spinal decompression surgery    OB History   No obstetric history on file.      Home Medications    Prior to Admission medications   Medication Sig Start Date End Date Taking? Authorizing Provider  azithromycin (ZITHROMAX) 250 MG tablet Take 1 tablet (250 mg total) by mouth daily. Take first 2 tablets together, then 1 every day until finished. 07/14/22  Yes Kalim Kissel-Warren, Alda Lea, NP  acetaminophen (TYLENOL) 500 MG tablet Take 1,000 mg by mouth at bedtime.    [provider]  albuterol (PROVENTIL) (2.5 MG/3ML) 0.083% nebulizer solution Take 3 mLs (2.5 mg total) by nebulization every 6 (six) hours as needed for wheezing or shortness of breath. 01/19/22 01/19/23  Parrett, Fonnie Mu, NP  albuterol (VENTOLIN HFA) 108 (90 Base) MCG/ACT inhaler INHALE 2 PUFFS INTO THE LUNGS IN THE MORNING, AT NOON, AND AT BEDTIME. 05/07/22   Chesley Mires, MD  amLODipine (NORVASC) 10 MG tablet TAKE 1 TABLET BY MOUTH EVERY DAY Patient taking differently: Take 10 mg by mouth daily. 11/14/21   [name], MD  aspirin EC 325 MG tablet Take 325 mg by mouth in the morning.    [provider]  budesonide-formoterol (SYMBICORT) 160-4.5 MCG/ACT inhaler Inhale 2 puffs into the lungs 2 (two) times daily. 01/16/22   Eugenie Filler, MD  calcium-vitamin D (OSCAL WITH D) 500-200 MG-UNIT per tablet Take 1 tablet by mouth daily.    [provider]  cephALEXin (KEFLEX) 500 MG capsule Take 1 capsule (500 mg total) by mouth 2 (two) times daily. 07/04/22   Henson, Vickie L, NP-C  Cephalexin 250 MG tablet Take 1 tablet (250 mg total) by mouth daily. 07/10/22   Stoneking, Reece Leader., MD  Cholecalciferol (VITAMIN D-3) 25 MCG (1000 UT) CAPS Take 1,000 Units by mouth in the morning.    [provider]  Cranberry 500 MG TABS Take 500 mg by mouth in the morning and at bedtime.    [provider]  CRANBERRY PO Take by mouth.    [provider]  docusate sodium (COLACE) 100 MG capsule Take 200 mg by mouth at bedtime.    [provider]  estradiol (ESTRACE) 0.1 MG/GM vaginal cream Place 1 g vaginally daily. 04/25/21   [provider]  FLONASE 50 MCG/ACT nasal spray Place 2 sprays into both nostrils See admin instructions. Instill 2 sprays into each nostril at bedtime    [provider]  furosemide (LASIX) 20 MG tablet TAKE 1/2 TABLET BY MOUTH EVERY DAY 06/27/22   Henson, Vickie L, NP-C  glipiZIDE (GLUCOTROL XL) 5 MG 24 hr tablet Take 1 tablet (5 mg total) by mouth daily with breakfast. 05/09/22   Henson, Vickie L, NP-C  glucose blood (ONETOUCH ULTRA) test strip CHECK BLOOD SUGAR ONCE DAILY AND AS DIRECTED. 10/18/21   [name], MD  hydrALAZINE (APRESOLINE) 25  MG tablet Take 1 tablet (25 mg total) by mouth every 8 (eight) hours. 06/20/22   Henson, Vickie L, NP-C  hydrochlorothiazide (MICROZIDE) 12.5 MG capsule Take 1 capsule (12.5 mg total) by mouth daily. 05/07/22   Henson, Vickie L, NP-C  Lancets (ONETOUCH DELICA PLUS [id number]) MISC CHECK BLOOD SUGAR ONCE DAILY AND AS DIRECTED 08/01/21   [name], MD  loratadine (CLARITIN) 10 MG tablet Take 1 tablet (10 mg total) by mouth daily. 01/16/22   Eugenie Filler, MD  metoprolol succinate (TOPROL-XL) 25 MG 24 hr tablet Take 12.5 mg by mouth 2 (two) times daily. 09/16/21   [provider]  mirtazapine (REMERON) 15 MG tablet TAKE 1 TABLET BY MOUTH EVERYDAY AT BEDTIME 03/23/22   Henson, Vickie L, NP-C  Multiple Vitamin (MULTIVITAMIN) capsule Take 1 capsule by mouth daily.    [provider]  Multiple Vitamins-Minerals (PRESERVISION AREDS 2+MULTI VIT) CAPS Take 1 capsule by mouth in the morning and at bedtime.    [provider]  Olopatadine HCl (PATADAY OP) Place 1 drop into both eyes 2 (two) times daily as needed (for itching or redness).    [provider]  pantoprazole (PROTONIX) 40 MG tablet Take 1 tablet (40 mg total) by  mouth daily at 6 (six) AM. 01/17/22   Eugenie Filler, MD  pioglitazone (ACTOS) 30 MG tablet Take 1 tablet (30 mg total) by mouth daily. 09/21/21   [name], MD  sertraline (ZOLOFT) 50 MG tablet Take 1 tablet (50 mg total) by mouth daily. 12/11/21   [name], MD  sitaGLIPtin (JANUVIA) 100 MG tablet Take 1 tablet (100 mg total) by mouth daily. 06/20/22   Henson, Vickie L, NP-C  solifenacin (VESICARE) 10 MG tablet TAKE 1 TABLET BY MOUTH EVERY DAY 05/15/22   Harland Dingwall L, NP-C  Spacer/Aero-Holding Chambers DEVI 1 each by Does not apply route at bedtime. Use with inhaler. 02/12/22   Chesley Mires, MD  trandolapril (MAVIK) 4 MG tablet TAKE 1 TABLET DAILY Patient taking differently: Take 4 mg by mouth daily. 10/30/21   [name], MD  triamcinolone (NASACORT) 55 MCG/ACT AERO nasal inhaler Place 2 sprays into the nose daily. Patient taking differently: Place 2 sprays into the nose See admin instructions. Instill 2 sprays into each nostril at bedtime when not using Flonase 03/28/20   [name], MD  vitamin B-12 (CYANOCOBALAMIN) 1000 MCG tablet Take 1,000 mcg by mouth daily.     [provider]    Family History Family History  Problem Relation Age of Onset   COPD Brother    Cancer Sister        brain tumor with hemmorhage   Heart disease Sister        CAD    Social History Social History   Tobacco Use   Smoking status: Never    Passive exposure: Never   Smokeless tobacco: Never  Substance Use Topics   Alcohol use: No    Alcohol/week: 0.0 standard drinks of alcohol   Drug use: No     Allergies   Tape and Voltaren [diclofenac]   Review of Systems Review of Systems Per HPI  Physical Exam Triage Vital Signs ED Triage Vitals  Enc Vitals Group     BP 07/14/22 1343 (!) 148/74     Pulse Rate 07/14/22 1343 61     Resp 07/14/22 1343 18     Temp 07/14/22 1343 97.9 F (36.6 C)     Temp  Source 07/14/22 1343 Oral     SpO2 07/14/22 1343 94 %      Weight --      Height --      Head Circumference --      Peak Flow --      Pain Score 07/14/22 1344 0     Pain Loc --      Pain Edu? --      Excl. in [place]? --    No data found.  Updated Vital Signs BP (!) 148/74 (BP Location: Right Arm)   Pulse 61   Temp 97.9 F (36.6 C) (Oral)   Resp 18   SpO2 94%   Visual Acuity Right Eye Distance:   Left Eye Distance:   Bilateral Distance:    Right Eye Near:   Left Eye Near:    Bilateral Near:     Physical Exam Vitals and nursing note reviewed.  Constitutional:      General: She is not in acute distress.    Appearance: Normal appearance.  HENT:     Head: Normocephalic.     Right Ear: Tympanic membrane, ear canal and external ear normal.     Left Ear: Tympanic membrane, ear canal and external ear normal.     Nose: Nose normal.     Mouth/Throat:     Mouth: Mucous membranes are moist.  Eyes:     Extraocular Movements: Extraocular movements intact.     Conjunctiva/sclera: Conjunctivae normal.     Pupils: Pupils are equal, round, and reactive to light.  Cardiovascular:     Rate and Rhythm: Normal rate and regular rhythm.     Pulses: Normal pulses.     Heart sounds: Normal heart sounds.  Pulmonary:     Effort: Pulmonary effort is normal.     Breath sounds: Normal breath sounds.  Abdominal:     General: Bowel sounds are normal.     Palpations: Abdomen is soft.  Musculoskeletal:     Cervical back: Normal range of motion.  Lymphadenopathy:     Cervical: No cervical adenopathy.  Skin:    General: Skin is warm and dry.  Neurological:     General: No focal deficit present.     Mental Status: She is alert and oriented to person, place, and time.  Psychiatric:        Mood and Affect: Mood normal.        Behavior: Behavior normal.      UC Treatments / Results  Labs (all labs ordered are listed, but only abnormal results are displayed) Labs Reviewed - No data to display  EKG   Radiology DG Chest 2 View  Result  Date: 07/14/2022 CLINICAL DATA:  Cough. EXAM: CHEST - 2 VIEW COMPARISON:  July 04, 2022. FINDINGS: Stable cardiomediastinal silhouette. Right lung is clear. Mild left basilar atelectasis or infiltrate is noted with small associated pleural effusion. Bony thorax is unremarkable. IMPRESSION: Mild left basilar atelectasis or infiltrate is noted with small left pleural effusion. Electronically Signed   By: Marijo Conception M.D.   On: 07/14/2022 14:29    Procedures Procedures (including critical care time)  Medications Ordered in UC Medications - No data to display  Initial Impression / Assessment and Plan / UC Course  I have reviewed the triage vital signs and the nursing notes.  Pertinent labs & imaging results that were available during my care of the patient were reviewed by me and considered in my medical decision making (see chart for  details).  Patient brought in by her caregiver for complaints of cough and sore throat.  On exam, patient's vital signs are stable, although she is mildly hypertensive, she is in no acute distress.  Checks x-ray was performed to rule out pneumonia, x-ray does show left basilar atelectasis or infiltrate with small left pleural effusion.  This most likely is also exacerbating her cough.  Will start patient on azithromycin 250 mg in the interim.  Supportive care recommendations were provided to the patient's caregiver.  It is recommended that the patient follow-up with her primary care physician within the next week for reevaluation.  Strict ER precautions were given to the patient regarding return.  Patient's caregiver verbalizes understanding.  All questions were answered.  Patient is stable for discharge. Final Clinical Impressions(s) / UC Diagnoses   Final diagnoses:  Cough, unspecified type  Atelectasis     Discharge Instructions      The chest x-ray does not show any sign of pneumonia at this time.  I am treating the patient with an  antibiotic. Increase fluids and allow for plenty of rest. Continue use of Mucinex and the albuterol inhaler she is using at this time. Recommend using a humidifier in her bedroom at nighttime during sleep to help with cough and to have her sleep elevated on pillows. Recommend that she follow-up with her primary care physician within the next week for reevaluation. Go to the emergency department immediately if she develops worsening cough, shortness of breath, difficulty breathing, or other concerns. Follow-up as needed.     ED Prescriptions     Medication Sig Dispense Auth. Provider   azithromycin (ZITHROMAX) 250 MG tablet Take 1 tablet (250 mg total) by mouth daily. Take first 2 tablets together, then 1 every day until finished. 6 tablet Levia Waltermire-Warren, Alda Lea, NP      PDMP not reviewed this encounter.   Tish Men, NP 07/14/22 1440

## 2022-07-14 NOTE — ED Triage Notes (Signed)
Chest congestion.  States had a coughing spell a few days ago and then throat became sore after.  States sore throat is gone and now has a "rattling in chest"

## 2022-07-14 NOTE — Discharge Instructions (Signed)
The chest x-ray does not show any sign of pneumonia at this time.  I am treating the patient with an antibiotic. Increase fluids and allow for plenty of rest. Continue use of Mucinex and the albuterol inhaler she is using at this time. Recommend using a humidifier in her bedroom at nighttime during sleep to help with cough and to have her sleep elevated on pillows. Recommend that she follow-up with her primary care physician within the next week for reevaluation. Go to the emergency department immediately if she develops worsening cough, shortness of breath, difficulty breathing, or other concerns. Follow-up as needed.

## 2022-07-16 ENCOUNTER — Telehealth: Payer: Self-pay | Admitting: Family Medicine

## 2022-07-16 NOTE — Telephone Encounter (Signed)
Called and spoke with Renee [name] who wanted to let us know in regards to pt's UC visit over the holiday. Pt's cough was worsening and had a severe sore throat and when she called our triage line they advised her to go to UC. She went to UC where they did an xray and ruled out pneumonia but looked like possible bronchitis. They prescribed azithromycin and a nebulizer and pt reports that starting yesterday she is doing 100% better. Encouraged Renee [name] that if pt's symptoms come back or worsen to make an appt.   Renee [name] reports pt is going on vacation in December and she is trying to get a portable oxygen tank to take as pt uses oxygen at night and when she contacted [employer] to order a "port-a-pack oxygen tank" they told her that her physician needs to order this. She is wondering if you'd be able to order this for her?

## 2022-07-16 NOTE — Telephone Encounter (Signed)
Urgent care visit over weekend. Sister asking to speak to Jarrett Soho in regards to visit..  Call home phone [phone]

## 2022-07-17 ENCOUNTER — Telehealth: Payer: Self-pay | Admitting: Pulmonary Disease

## 2022-07-17 NOTE — Telephone Encounter (Signed)
Spoke with care giver [name] (per DPR) who was requesting POC for travel. Pt is currently on 2 L qhs. I called [employer] and they asked to have [name] to call them and they would discuss the options available with her. [name] was given [employer]'s phone number and stated understanding. Nothing further needed at this time.

## 2022-07-17 NOTE — Telephone Encounter (Signed)
LM for [name] (Armentha's caregiver) to contact pt's pulmonologist to order the portable oxygen tank. Provided office number for any questions

## 2022-07-18 ENCOUNTER — Telehealth: Payer: Self-pay | Admitting: Pulmonary Disease

## 2022-07-18 NOTE — Telephone Encounter (Signed)
Did they swab her for COVID? If not, would recommend that she take a home test and notify if positive. If she's feeling fine, had clear exam at [facility], and has already been treated with antibiotic, would recommend supportive care measures. She can use mucinex 600 mg Twice daily for chest congestion, saline nasal spray 2-3 times a day, and flonase nasal spray 2 sprays each nostril daily , until symptoms improve. OTC chloraseptic spray and tylenol for pain. She can also use throat lozenges. Needs OV if symptoms worsen or do not improve.

## 2022-07-18 NOTE — Telephone Encounter (Signed)
I called and spoke with [name] and notified of response per Joellen Jersey. She verbalized understanding. Pt did take covid test and this was negative. Nothing further needed.

## 2022-07-18 NOTE — Telephone Encounter (Signed)
Primary Pulmonologist: Sood Last office visit and with whom: 02/09/2022  What do we see them for (pulmonary problems): Nocturnal Hypoxemia, asthma Last OV assessment/plan:  Assessment/Plan:    Moderate, persistent asthma. - continue symbicort - prn albuterol - will arrange for spacer device - demonstrated inhaler technique - prn tussin   Nocturnal hypoxemia. - set up with home oxygen after hospitalization for pneumonia in May 2023 - clinically improved - will arrange for ONO on room air and determine if she can have home oxygen set up removed   Time Spent Involved in Patient Care on Day of Examination:  36 minutes   Follow up:    Patient Instructions  Will arrange for overnight oxygen test on room air   Symbicort two puffs in the morning and two puffs in the evening, and rinse your mouth after each use   Albuterol two puffs every 6 hours as needed to help with cough, wheeze, chest congestion or shortness of breath   Will arrange for spacer device to use with symbicort and albuterol   Follow up in 6 months   Medication List:    Allergies as of 02/09/2022         Reactions    Tape Other (See Comments)    The patient's skin is VERY THIN- will TEAR EASILY!!    Voltaren [diclofenac] Swelling, Other (See Comments)    Angioedema            Medication List           Accurate as of February 09, 2022  3:10 PM. If you have any questions, ask your nurse or doctor.              acetaminophen 500 MG tablet Commonly known as: TYLENOL Take 1,000 mg by mouth at bedtime.    albuterol 108 (90 Base) MCG/ACT inhaler Commonly known as: VENTOLIN HFA Inhale 2 puffs into the lungs in the AM, noon, and bedtime for 3 days, then 2 puffs every 4 hrs as needed for shortness of breath/wheezing (for 3 days). Start taking on: Jan 16, 2022    albuterol (2.5 MG/3ML) 0.083% nebulizer solution Commonly known as: PROVENTIL Take 3 mLs (2.5 mg total) by nebulization every 6 (six) hours as  needed for wheezing or shortness of breath.    ALPRAZolam 0.5 MG tablet Commonly known as: XANAX Take 1 tablet (0.5 mg total) by mouth 2 (two) times daily. TAKE 1/2 TO 1 TABLET BY MOUTH TWICE A DAY AS NEEDED FOR ANXIETY Strength: 0.5 mg    amLODipine 10 MG tablet Commonly known as: NORVASC TAKE 1 TABLET BY MOUTH EVERY DAY    aspirin EC 325 MG tablet Take 325 mg by mouth in the morning.    calcium-vitamin D 500-200 MG-UNIT tablet Commonly known as: OSCAL WITH D Take 1 tablet by mouth daily.    Cranberry 500 MG Tabs Take 500 mg by mouth in the morning and at bedtime.    docusate sodium 100 MG capsule Commonly known as: COLACE Take 200 mg by mouth at bedtime.    estradiol 0.1 MG/GM vaginal cream Commonly known as: ESTRACE Place 1 g vaginally daily.    Flonase 50 MCG/ACT nasal spray Generic drug: fluticasone Place 2 sprays into both nostrils See admin instructions. Instill 2 sprays into each nostril at bedtime    FreeStyle Libre 2 Reader [facility] 1 each by Does not apply route as directed.    FreeStyle Libre 2 Sensor Misc 1 each by Does not apply  route as directed.    furosemide 20 MG tablet Commonly known as: LASIX Take 1 tablet (20 mg total) by mouth daily.    glipiZIDE 5 MG 24 hr tablet Commonly known as: GLUCOTROL XL TAKE 1 TABLET BY MOUTH EVERY DAY WITH BREAKFAST What changed: See the new instructions.    hydrALAZINE 25 MG tablet Commonly known as: APRESOLINE Take 1 tablet (25 mg total) by mouth every 8 (eight) hours.    hydrochlorothiazide 12.5 MG capsule Commonly known as: MICROZIDE TAKE 1 CAPSULE BY MOUTH EVERY DAY What changed: how much to take    Januvia 100 MG tablet Generic drug: sitaGLIPtin TAKE 1 TABLET BY MOUTH EVERY DAY    loratadine 10 MG tablet Commonly known as: CLARITIN Take 1 tablet (10 mg total) by mouth daily.    metoprolol succinate 25 MG 24 hr tablet Commonly known as: TOPROL-XL Take 12.5 mg by mouth 2 (two) times daily.     mirtazapine 15 MG tablet Commonly known as: REMERON Take 1 tablet (15 mg total) by mouth at bedtime.    multivitamin capsule Take 1 capsule by mouth daily.    ONE TOUCH ULTRA 2 w/Device Kit Check blood sugar once daily and as directed. Dx R97.5    OneTouch Delica Plus [id number] Misc CHECK BLOOD SUGAR ONCE DAILY AND AS DIRECTED    OneTouch Ultra test strip Generic drug: glucose blood CHECK BLOOD SUGAR ONCE DAILY AND AS DIRECTED.    pantoprazole 40 MG tablet Commonly known as: PROTONIX Take 1 tablet (40 mg total) by mouth daily at 6 (six) AM.    PATADAY OP Place 1 drop into both eyes 2 (two) times daily as needed (for itching or redness).    pioglitazone 30 MG tablet Commonly known as: Actos Take 1 tablet (30 mg total) by mouth daily.    PreserVision AREDS 2+Multi Vit Caps Take 1 capsule by mouth in the morning and at bedtime.    sertraline 50 MG tablet Commonly known as: ZOLOFT Take 1 tablet (50 mg total) by mouth daily.    solifenacin 10 MG tablet Commonly known as: VESICARE TAKE 1 TABLET BY MOUTH EVERY DAY    Symbicort 160-4.5 MCG/ACT inhaler Generic drug: budesonide-formoterol Inhale 2 puffs into the lungs 2 (two) times daily.    trandolapril 4 MG tablet Commonly known as: MAVIK TAKE 1 TABLET DAILY    triamcinolone 55 MCG/ACT Aero nasal inhaler Commonly known as: NASACORT Place 2 sprays into the nose daily. What changed:  when to take this additional instructions    vitamin B-12 1000 MCG tablet Commonly known as: CYANOCOBALAMIN Take 1,000 mcg by mouth daily.    Vitamin D-3 25 MCG (1000 UT) Caps Take 1,000 Units by mouth in the morning.             Signature:  Chesley Mires, MD [place] Pager - [phone] 02/09/2022, 3:10 PM                       Patient Instructions by Chesley Mires, MD at 02/09/2022 2:45 PM  Author: Chesley Mires, MD Author Type: Physician Filed: 02/09/2022  3:06 PM  Note Status: Signed  Cosign: Cosign Not Required Encounter Date: 02/09/2022  Editor: Chesley Mires, MD (Physician)             Will arrange for overnight oxygen test on room air   Symbicort two puffs in the morning and two puffs in the evening, and rinse your mouth after  each use   Albuterol two puffs every 6 hours as needed to help with cough, wheeze, chest congestion or shortness of breath   Will arrange for spacer device to use with symbicort and albuterol   Follow up in 6 months       Orthostatic Vitals Recorded in This Encounter   02/09/2022 1434     Patient Position: Sitting  BP Location: Right Arm  Cuff Size: Normal   Instructions   Return in about 6 months (around 08/11/2022). Will arrange for overnight oxygen test on room air   Symbicort two puffs in the morning and two puffs in the evening, and rinse your mouth after each use   Albuterol two puffs every 6 hours as needed to help with cough, wheeze, chest congestion or shortness of breath   Will arrange for spacer device to use with symbicort and albuterol   Follow up in 6 months       Was appointment offered to patient (explain)?  No, seen at [facility] on 11/25   Reason for call:      Congested in chest, told it was like a bronchitis (at urgent care on 07/14/2022).  Nose is stopped up.  Takes last antibiotic tomorrow.  She is taking Mucinex severe cold/cough, it is helping with the cough.  Taking all meds as prescribed.  She does not feel bad.  Sats are 97-99%.  Denies any fever, chills or body aches.  Caregiver states patient is prone to pna, however, the UC told her the lungs were clear.  Please advise.  Thank you.

## 2022-07-19 DIAGNOSIS — A403 Sepsis due to Streptococcus pneumoniae: Secondary | ICD-10-CM | POA: Diagnosis not present

## 2022-07-30 ENCOUNTER — Ambulatory Visit: Payer: Medicare HMO | Admitting: Primary Care

## 2022-08-01 ENCOUNTER — Ambulatory Visit: Payer: Medicare HMO | Admitting: Primary Care

## 2022-08-01 ENCOUNTER — Telehealth: Payer: Self-pay | Admitting: Family Medicine

## 2022-08-01 ENCOUNTER — Encounter: Payer: Self-pay | Admitting: Primary Care

## 2022-08-01 ENCOUNTER — Telehealth: Payer: Self-pay | Admitting: Primary Care

## 2022-08-01 VITALS — BP 127/56 | HR 53 | Temp 98.2°F | Ht 63.0 in | Wt 178.0 lb

## 2022-08-01 DIAGNOSIS — J9611 Chronic respiratory failure with hypoxia: Secondary | ICD-10-CM

## 2022-08-01 DIAGNOSIS — J45901 Unspecified asthma with (acute) exacerbation: Secondary | ICD-10-CM

## 2022-08-01 DIAGNOSIS — J301 Allergic rhinitis due to pollen: Secondary | ICD-10-CM | POA: Diagnosis not present

## 2022-08-01 MED ORDER — PREDNISONE 10 MG PO TABS: ORAL_TABLET | ORAL | 0 refills | Status: DC

## 2022-08-01 NOTE — Telephone Encounter (Signed)
Letter written and placed in my desk for caregiver pick up on Friday

## 2022-08-01 NOTE — Telephone Encounter (Signed)
Granville Lewis - patient's caregiver - phone#  [phone]  Patient needs a letter stating that she has had a stroke and is disabled.  Mrs. Charlie Pitter is taking care of some of her affairs and this letter is needed.  Mr. Charlie Pitter has an appointment with you on Friday and would like to pick this letter up then.

## 2022-08-01 NOTE — Assessment & Plan Note (Signed)
-   Continue Symbicort 160 two puffs twice daily - RX prednisone '20mg'$  daily x 5 days

## 2022-08-01 NOTE — Progress Notes (Signed)
$'@Patient'p$  ID: Renee [name], female    DOB: 29-Apr-1929, 88 y.o.   MRN: [id number]  No chief complaint on file.   Referring provider: Girtha Rm, NP-C  HPI: 88 year old female, never smoked. PMH significant for asthma  Previous [facility] pulmonary encounter: 02/09/22- Dr. Halford Chessman  She is here with her family.   She was in hospital recently with pneumonia.  Sent home with inhalers and oxygen at night.  Inhalers help.  She still has intermittent cough and wheeze.  Sleeping okay.  Chest xray from 01/13/22 was clear.   Moderate, persistent asthma. - continue symbicort - prn albuterol - will arrange for spacer device - demonstrated inhaler technique - prn tussin   Nocturnal hypoxemia. - set up with home oxygen after hospitalization for pneumonia in May 2023 - clinically improved - will arrange for ONO on room air and determine if she can have home oxygen set up removed   08/01/2022- Interim hx  Patient presents today for 6 month follow-up.  Accompanied by her caretaker.  Her breathing has been fine She will have slight wheeze at night/early morning She has a periodic cough without purulent mucus. Taking mucinex twice daily and using flonase nasal spray  Wearing oxygen at bedtime, unsure litter flow Leaving for [place] on 12/20 for 3 days They needs assistance getting oxygen  Denies shortness of breath    Allergies  Allergen Reactions   Tape Other (See Comments)    The patient's skin is VERY THIN- will TEAR EASILY!!   Voltaren [Diclofenac] Swelling and Other (See Comments)    Angioedema    Immunization History  Administered Date(s) Administered   Fluad Quad(high Dose 65+) 06/02/2019, 05/13/2020, 05/26/2021, 05/30/2022   Influenza Split 05/21/2011, 06/11/2012   Influenza Whole 05/26/2004, 05/23/2010   Influenza,inj,Quad PF,6+ Mos 04/17/2013, 04/27/2014, 05/20/2015, 05/22/2016, 05/22/2017, 05/22/2018   PFIZER(Purple Top)SARS-COV-2 Vaccination 10/16/2019, 11/17/2019    Pfizer Covid-19 Vaccine Bivalent Booster 20yr & up 06/15/2021   Pneumococcal Conjugate-13 04/27/2014   Pneumococcal Polysaccharide-23 09/26/2005   Td 05/21/1999, 04/04/2012   Zoster Recombinat (Shingrix) 01/06/2020, 03/15/2020    Past Medical History:  Diagnosis Date   Angioedema    possibly from voltaren   Bronchopneumonia 12/11/2016   Degenerative disc disease    Diabetes mellitus    type II   Hyperlipidemia    Hypertension    LVH (left ventricular hypertrophy)    and atrial enlargement by echo in past with nl EF   Nasal pruritis    Osteoarthritis    Osteopenia    Renal insufficiency    Sleep apnea    [place]   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     albuterol (PROVENTIL) (2.5 MG/3ML) 0.083% nebulizer solution Take 3 mLs (2.5 mg total) by nebulization every 6 (six) hours as needed for wheezing or shortness of breath. 75 mL 5   albuterol (VENTOLIN HFA) 108 (90 Base) MCG/ACT inhaler INHALE 2 PUFFS INTO THE LUNGS IN THE MORNING, AT NOON, AND AT BEDTIME. 6.7 each 1   amLODipine (NORVASC) 10 MG tablet TAKE 1 TABLET BY MOUTH EVERY DAY (Patient  taking differently: Take 10 mg by mouth daily.) 90 tablet 3   aspirin EC 325 MG tablet Take 325 mg by mouth in the morning.     azithromycin (ZITHROMAX) 250 MG tablet Take 1 tablet (250 mg total) by mouth daily. Take first 2 tablets together, then 1 every day until finished. 6 tablet 0   budesonide-formoterol (SYMBICORT) 160-4.5 MCG/ACT inhaler Inhale 2 puffs into the lungs 2 (two) times daily. 10.2 g 1   calcium-vitamin D (OSCAL WITH D) 500-200  MG-UNIT per tablet Take 1 tablet by mouth daily.     cephALEXin (KEFLEX) 500 MG capsule Take 1 capsule (500 mg total) by mouth 2 (two) times daily. 10 capsule 0   Cephalexin 250 MG tablet Take 1 tablet (250 mg total) by mouth daily. 30 tablet 2   Cholecalciferol (VITAMIN D-3) 25 MCG (1000 UT) CAPS Take 1,000 Units by mouth in the morning.     Cranberry 500 MG TABS Take 500 mg by mouth in the morning and at bedtime.     CRANBERRY PO Take by mouth.     docusate sodium (COLACE) 100 MG capsule Take 200 mg by mouth at bedtime.     estradiol (ESTRACE) 0.1 MG/GM vaginal cream Place 1 g vaginally daily.     FLONASE 50 MCG/ACT nasal spray Place 2 sprays into both nostrils See admin instructions. Instill 2 sprays into each nostril at bedtime     furosemide (LASIX) 20 MG tablet TAKE 1/2 TABLET BY MOUTH EVERY DAY 45 tablet 0   glipiZIDE (GLUCOTROL XL) 5 MG 24 hr tablet Take 1 tablet (5 mg total) by mouth daily with breakfast. 30 tablet 2   glucose blood (ONETOUCH ULTRA) test strip CHECK BLOOD SUGAR ONCE DAILY AND AS DIRECTED. 100 strip 11   hydrALAZINE (APRESOLINE) 25 MG tablet Take 1 tablet (25 mg total) by mouth every 8 (eight) hours. 90 tablet 2   hydrochlorothiazide (MICROZIDE) 12.5 MG capsule Take 1 capsule (12.5 mg total) by mouth daily. 30 capsule 2   Lancets (ONETOUCH DELICA PLUS [id number]) MISC CHECK BLOOD SUGAR ONCE DAILY AND AS DIRECTED 100 each 2   loratadine (CLARITIN) 10 MG tablet Take 1 tablet (10 mg total) by mouth daily. 30 tablet 1   metoprolol succinate (TOPROL-XL) 25 MG 24 hr tablet Take 12.5 mg by mouth 2 (two) times daily.     mirtazapine (REMERON) 15 MG tablet TAKE 1 TABLET BY MOUTH EVERYDAY AT BEDTIME 90 tablet 0   Multiple Vitamin (MULTIVITAMIN) capsule Take 1 capsule by mouth daily.     Multiple Vitamins-Minerals (PRESERVISION AREDS 2+MULTI VIT) CAPS Take 1 capsule by mouth in the morning and at bedtime.     Olopatadine HCl (PATADAY OP) Place 1 drop into both eyes 2 (two) times  daily as needed (for itching or redness).     pantoprazole (PROTONIX) 40 MG tablet Take 1 tablet (40 mg total) by mouth daily at 6 (six) AM. 30 tablet 1   pioglitazone (ACTOS) 30 MG tablet Take 1 tablet (30 mg total) by mouth daily. 90 tablet 3   sertraline (ZOLOFT) 50 MG tablet Take 1 tablet (50 mg total) by mouth daily. 90 tablet 1   sitaGLIPtin (JANUVIA) 100 MG tablet Take 1 tablet (100 mg total) by mouth daily. 30 tablet 2   solifenacin (VESICARE) 10 MG tablet TAKE 1 TABLET BY MOUTH EVERY DAY 30 tablet 2   Spacer/Aero-Holding Chambers DEVI 1 each by Does not apply route at bedtime. Use with inhaler. 1 each 0  trandolapril (MAVIK) 4 MG tablet TAKE 1 TABLET DAILY (Patient taking differently: Take 4 mg by mouth daily.) 90 tablet 3   triamcinolone (NASACORT) 55 MCG/ACT AERO nasal inhaler Place 2 sprays into the nose daily. (Patient taking differently: Place 2 sprays into the nose See admin instructions. Instill 2 sprays into each nostril at bedtime when not using Flonase) 1 each 5   vitamin B-12 (CYANOCOBALAMIN) 1000 MCG tablet Take 1,000 mcg by mouth daily.      No facility-administered medications prior to visit.    Review of Systems  Review of Systems  Constitutional: Negative.   HENT: Negative.    Respiratory:  Positive for cough and wheezing. Negative for chest tightness and shortness of breath.   Cardiovascular: Negative.     Physical Exam  There were no vitals taken for this visit. Physical Exam Constitutional:      Appearance: Normal appearance.  HENT:     Head: Normocephalic and atraumatic.  Pulmonary:     Effort: Pulmonary effort is normal. No respiratory distress.     Breath sounds: Wheezing present.  Musculoskeletal:        General: Normal range of motion.     Comments: In WC  Skin:    General: Skin is warm and dry.  Neurological:     General: No focal deficit present.     Mental Status: She is alert and oriented to person, place, and time. Mental status is at  baseline.  Psychiatric:        Mood and Affect: Mood normal.        Behavior: Behavior normal.        Thought Content: Thought content normal.        Judgment: Judgment normal.     Lab Results:  CBC    Component Value Date/Time   WBC 4.6 07/04/2022 1140   RBC 3.66 (L) 07/04/2022 1140   HGB 10.5 (L) 07/04/2022 1140   HGB 11.5 (L) 03/06/2013 1441   HCT 32.2 (L) 07/04/2022 1140   HCT 32.8 (L) 01/29/2013 1325   PLT 152.0 07/04/2022 1140   PLT 182 01/29/2013 1325   MCV 88.0 07/04/2022 1140   MCV 77 (L) 01/29/2013 1325   MCH 28.7 01/15/2022 0137   MCHC 32.5 07/04/2022 1140   RDW 14.9 07/04/2022 1140   RDW 17.2 (H) 01/29/2013 1325   LYMPHSABS 0.9 07/04/2022 1140   LYMPHSABS 0.8 (L) 01/29/2013 1325   MONOABS 0.4 07/04/2022 1140   MONOABS 0.3 01/29/2013 1325   EOSABS 0.3 07/04/2022 1140   EOSABS 0.3 01/29/2013 1325   BASOSABS 0.1 07/04/2022 1140   BASOSABS 0.1 01/29/2013 1325    BMET    Component Value Date/Time   NA 139 07/04/2022 1140   K 4.2 07/04/2022 1140   CL 103 07/04/2022 1140   CO2 32 07/04/2022 1140   GLUCOSE 242 (H) 07/04/2022 1140   BUN 22 07/04/2022 1140   CREATININE 1.38 (H) 07/04/2022 1140   CREATININE 1.59 (H) 12/25/2021 1046   CALCIUM 8.5 07/04/2022 1140   GFRNONAA 32 (L) 01/15/2022 0137   GFRNONAA 32 (L) 12/19/2020 1134   GFRAA 37 (L) 12/19/2020 1134    BNP    Component Value Date/Time   BNP 243.4 (H) 01/05/2022 1855    ProBNP    Component Value Date/Time   PROBNP 161.0 (H) 07/04/2022 1140    Imaging: DG Chest 2 View  Result Date: 07/14/2022 CLINICAL DATA:  Cough. EXAM: CHEST - 2 VIEW COMPARISON:  July 04, 2022. FINDINGS:  Stable cardiomediastinal silhouette. Right lung is clear. Mild left basilar atelectasis or infiltrate is noted with small associated pleural effusion. Bony thorax is unremarkable. IMPRESSION: Mild left basilar atelectasis or infiltrate is noted with small left pleural effusion. Electronically Signed   By: Marijo Conception M.D.   On: 07/14/2022 14:29   DG Chest 2 View  Result Date: 07/04/2022 CLINICAL DATA:  Fatigue, weakness, cough, CHF.  History of pneumonia EXAM: CHEST - 2 VIEW COMPARISON:  Chest two-view 04/13/2022 FINDINGS: Hypoventilation with decreased lung volume similar to the prior study. Bibasilar airspace disease similar to the prior study. No significant pleural effusion. Negative for heart failure or edema. IMPRESSION: Hypoventilation with bibasilar atelectasis. No significant change from the prior study. Electronically Signed   By: Franchot Gallo M.D.   On: 07/04/2022 11:45     Assessment & Plan:   No problem-specific Assessment & Plan notes found for this encounter.   Recommendations: - Make sure you are using Symbicort (budesonide-formoterol) - take two puffs morning and evening - START famotidine '20mg'$  at bedtime - Continue flonase nasal spray  - Continue mucinex '600mg'$  twice  - Continue to wear oxygen at bedtime  - Sending in prednisone '20mg'$  x 5 days for wheezing   Follow-up: 3 months with Dr. Almedia Balls, NP 08/01/2022

## 2022-08-01 NOTE — Patient Instructions (Signed)
We will reach out to [facility] about renting oxygen supplies. If they can not provide this another option would be to rent oxygen through  different medical supply company, I'd recommend trying [employer].   We were told for you to call [employer] again [phone]   Recommendations: - Make sure you are using Symbicort (budesonide-formoterol) - take two puffs morning and evening - START famotidine '20mg'$  at bedtime - Continue flonase nasal spray  - Continue mucinex '600mg'$  twice  - Continue to wear oxygen at bedtime  - Sending in prednisone '20mg'$  x 5 days for wheezing   Follow-up: 3 months with Renee [name]

## 2022-08-01 NOTE — Assessment & Plan Note (Signed)
-   Continue flonase nasal spray

## 2022-08-01 NOTE — Telephone Encounter (Signed)
Renee [name] was here w/Dr. Volanda Napoleon for recent appt and mentioned they are going on a trip the 20th and need portable 02. They are "getting the run around" from [place] stating they need a script. Need del of 02 by 12/19. Please call to advise. Caretaker is Granville Lewis at [phone]

## 2022-08-02 NOTE — Telephone Encounter (Signed)
Care taker, Granville Lewis , calling to state she called [employer] again and is still getting the run around. Will adv notes indicate we are trying to get auth from [facility]. Pls call Ms. Pitt's at [phone]  Also state Symbicort was to be called in Pharm did not have it when she went in. [facility] on [address]

## 2022-08-02 NOTE — Telephone Encounter (Signed)
Called patient and she states that she is going out of town. She states that [employer] os telling her she is needing a script for portable oxygen to take with her.   Beth are you ok for me to put in an order for temp portable oxygen while she is out of town  Please advise

## 2022-08-03 ENCOUNTER — Telehealth: Payer: Self-pay | Admitting: Primary Care

## 2022-08-03 MED ORDER — BUDESONIDE-FORMOTEROL FUMARATE 160-4.5 MCG/ACT IN AERO: 2.0000 | INHALATION_SPRAY | Freq: Two times a day (BID) | RESPIRATORY_TRACT | 11 refills | Status: DC

## 2022-08-03 NOTE — Addendum Note (Signed)
Addended by: Rosana Berger on: 08/03/2022 08:59 AM   Modules accepted: Orders

## 2022-08-03 NOTE — Telephone Encounter (Signed)
PT's  advocate called and I explained we have a msg from [place] and we need time to complete script for portable O2. More info needed and nurse will get to it as fast as she can.

## 2022-08-03 NOTE — Telephone Encounter (Signed)
[employer] ret Leslie's call [facility]:AFBX tank. He offered a concentrator but PT wants tanks because of source of travel. [employer] needs more info to provide port o2 tanks. Pls call Darnelle Maffucci for details @ [phone]

## 2022-08-03 NOTE — Telephone Encounter (Signed)
We can place an order for portable oxygen script

## 2022-08-03 NOTE — Telephone Encounter (Signed)
Letter has been picked up

## 2022-08-03 NOTE — Telephone Encounter (Signed)
Symbicort was refilled and also order to [employer] for portable o2 for travel  Left detailed msg on machine letting her know this has been done

## 2022-08-07 NOTE — Telephone Encounter (Signed)
I tried calling Renee [name] and could not get an answer- LMTCB to see what is needed.

## 2022-08-16 NOTE — Telephone Encounter (Signed)
See encounter from 12/13.

## 2022-08-18 DIAGNOSIS — A403 Sepsis due to Streptococcus pneumoniae: Secondary | ICD-10-CM | POA: Diagnosis not present

## 2022-08-21 ENCOUNTER — Telehealth: Payer: Self-pay | Admitting: Family Medicine

## 2022-08-21 ENCOUNTER — Other Ambulatory Visit: Payer: Self-pay | Admitting: Family Medicine

## 2022-08-21 NOTE — Telephone Encounter (Signed)
Called and spoke with pt's caregiver [name] who is having some concerns regarding Shante's memory, gait, and cough. She recently went on vacation with them to [facility] and was doing fine there with her walking but since then she has been a lot slower and seems to be getting tired and more eager to sit. When she goes to sit down, she goes about it too fast and has had an instance where she tried to sit before she was close enough to the chair and bruised her bottom. [name] says that before [place] she did notice that pt would drag her feet more when walking but as time has gone on it has not seemed to let up at all. Sari's memory has also seemed to have worsened as the things they had bought on her vacation she completely forgot about them and asked where they got the things when unpacking from their trip. [name] just wants to make sure Yolanda has not had another stroke again and doesn't want to bring [place] in unless Loletha Carrow thinks it is necessary as Ramani would start to worry and she does not want her to worry any more than need be. Of note, pt has not been to physical therapy in over a month as they have been holding off due to pt's continued cough. She was able to do the swallow test and that test came out fine but pt is still experiencing coughing every time she eats or swallows something so [name] would like to know if Loletha Carrow also thinks ENT would be of any help?

## 2022-08-21 NOTE — Telephone Encounter (Signed)
Patients caregiver Granville Lewis called and requested a call back from [place]. She had some questions for her. Call back is [phone] or [phone]

## 2022-08-22 ENCOUNTER — Other Ambulatory Visit: Payer: Self-pay | Admitting: Family Medicine

## 2022-08-22 MED ORDER — FUROSEMIDE 20 MG PO TABS: 10.0000 mg | ORAL_TABLET | Freq: Every day | ORAL | 0 refills | Status: DC

## 2022-08-22 MED ORDER — GLIPIZIDE ER 2.5 MG PO TB24: 2.5000 mg | ORAL_TABLET | Freq: Every day | ORAL | 0 refills | Status: DC

## 2022-08-22 NOTE — Progress Notes (Signed)
   Subjective:    Patient ID: Renee [name], female    DOB: [birth year], 89 y.o.   MRN: [id number]  HPI     Review of Systems     Objective:   Physical Exam        Assessment & Plan:

## 2022-08-22 NOTE — Telephone Encounter (Signed)
Caregiver [name] reports that pt does not currently see a nephrologist

## 2022-08-22 NOTE — Telephone Encounter (Signed)
Appt scheduled

## 2022-08-24 ENCOUNTER — Other Ambulatory Visit: Payer: Self-pay | Admitting: Pulmonary Disease

## 2022-08-24 ENCOUNTER — Encounter: Payer: Self-pay | Admitting: Family Medicine

## 2022-08-24 ENCOUNTER — Ambulatory Visit (INDEPENDENT_AMBULATORY_CARE_PROVIDER_SITE_OTHER): Payer: PPO | Admitting: Family Medicine

## 2022-08-24 ENCOUNTER — Ambulatory Visit (INDEPENDENT_AMBULATORY_CARE_PROVIDER_SITE_OTHER): Payer: PPO

## 2022-08-24 VITALS — BP 142/62 | HR 64 | Temp 97.6°F | Ht 63.0 in

## 2022-08-24 DIAGNOSIS — Z7409 Other reduced mobility: Secondary | ICD-10-CM

## 2022-08-24 DIAGNOSIS — I10 Essential (primary) hypertension: Secondary | ICD-10-CM | POA: Diagnosis not present

## 2022-08-24 DIAGNOSIS — Z79899 Other long term (current) drug therapy: Secondary | ICD-10-CM | POA: Diagnosis not present

## 2022-08-24 DIAGNOSIS — F32A Depression, unspecified: Secondary | ICD-10-CM

## 2022-08-24 DIAGNOSIS — E11319 Type 2 diabetes mellitus with unspecified diabetic retinopathy without macular edema: Secondary | ICD-10-CM | POA: Diagnosis not present

## 2022-08-24 DIAGNOSIS — I5032 Chronic diastolic (congestive) heart failure: Secondary | ICD-10-CM | POA: Diagnosis not present

## 2022-08-24 DIAGNOSIS — J9611 Chronic respiratory failure with hypoxia: Secondary | ICD-10-CM | POA: Diagnosis not present

## 2022-08-24 DIAGNOSIS — N184 Chronic kidney disease, stage 4 (severe): Secondary | ICD-10-CM | POA: Diagnosis not present

## 2022-08-24 DIAGNOSIS — R059 Cough, unspecified: Secondary | ICD-10-CM | POA: Diagnosis not present

## 2022-08-24 DIAGNOSIS — R5383 Other fatigue: Secondary | ICD-10-CM

## 2022-08-24 DIAGNOSIS — R531 Weakness: Secondary | ICD-10-CM

## 2022-08-24 DIAGNOSIS — Z8673 Personal history of transient ischemic attack (TIA), and cerebral infarction without residual deficits: Secondary | ICD-10-CM | POA: Diagnosis not present

## 2022-08-24 DIAGNOSIS — N390 Urinary tract infection, site not specified: Secondary | ICD-10-CM

## 2022-08-24 DIAGNOSIS — E538 Deficiency of other specified B group vitamins: Secondary | ICD-10-CM | POA: Diagnosis not present

## 2022-08-24 DIAGNOSIS — F419 Anxiety disorder, unspecified: Secondary | ICD-10-CM | POA: Diagnosis not present

## 2022-08-24 DIAGNOSIS — R053 Chronic cough: Secondary | ICD-10-CM

## 2022-08-24 DIAGNOSIS — J90 Pleural effusion, not elsewhere classified: Secondary | ICD-10-CM | POA: Diagnosis not present

## 2022-08-24 DIAGNOSIS — R079 Chest pain, unspecified: Secondary | ICD-10-CM | POA: Diagnosis not present

## 2022-08-24 LAB — POCT URINALYSIS DIPSTICK
Bilirubin, UA: NEGATIVE
Blood, UA: NEGATIVE
Glucose, UA: NEGATIVE
Ketones, UA: NEGATIVE
Nitrite, UA: NEGATIVE
Protein, UA: POSITIVE — AB
Spec Grav, UA: 1.025 (ref 1.010–1.025)
Urobilinogen, UA: 0.2 E.U./dL
pH, UA: 6 (ref 5.0–8.0)

## 2022-08-24 LAB — BASIC METABOLIC PANEL
BUN: 21 mg/dL (ref 6–23)
CO2: 32 mEq/L (ref 19–32)
Calcium: 9.1 mg/dL (ref 8.4–10.5)
Chloride: 100 mEq/L (ref 96–112)
Creatinine, Ser: 1.34 mg/dL — ABNORMAL HIGH (ref 0.40–1.20)
GFR: 34.68 mL/min — ABNORMAL LOW (ref >60.00)
Glucose, Bld: 295 mg/dL — ABNORMAL HIGH (ref 70–99)
Potassium: 4.2 mEq/L (ref 3.5–5.1)
Sodium: 139 mEq/L (ref 135–145)

## 2022-08-24 LAB — CBC WITH DIFFERENTIAL/PLATELET
Basophils Absolute: 0 10*3/uL (ref 0.0–0.1)
Basophils Relative: 0.6 % (ref 0.0–3.0)
Eosinophils Absolute: 0.2 10*3/uL (ref 0.0–0.7)
Eosinophils Relative: 3.7 % (ref 0.0–5.0)
HCT: 31.8 % — ABNORMAL LOW (ref 36.0–46.0)
Hemoglobin: 10.3 g/dL — ABNORMAL LOW (ref 12.0–15.0)
Lymphocytes Relative: 11.6 % — ABNORMAL LOW (ref 12.0–46.0)
Lymphs Abs: 0.7 10*3/uL (ref 0.7–4.0)
MCHC: 32.3 g/dL (ref 30.0–36.0)
MCV: 88.3 fl (ref 78.0–100.0)
Monocytes Absolute: 0.4 10*3/uL (ref 0.1–1.0)
Monocytes Relative: 5.7 % (ref 3.0–12.0)
Neutro Abs: 5 10*3/uL (ref 1.4–7.7)
Neutrophils Relative %: 78.4 % — ABNORMAL HIGH (ref 43.0–77.0)
Platelets: 173 10*3/uL (ref 150.0–400.0)
RBC: 3.6 Mil/uL — ABNORMAL LOW (ref 3.87–5.11)
RDW: 14.3 % (ref 11.5–15.5)
WBC: 6.3 10*3/uL (ref 4.0–10.5)

## 2022-08-24 LAB — VITAMIN B12: Vitamin B-12: >1500 pg/mL — ABNORMAL HIGH (ref 211–911)

## 2022-08-24 LAB — VITAMIN D 25 HYDROXY (VIT D DEFICIENCY, FRACTURES): VITD: 67.34 ng/mL (ref 30.00–100.00)

## 2022-08-24 LAB — BRAIN NATRIURETIC PEPTIDE: Pro B Natriuretic peptide (BNP): 298 pg/mL — ABNORMAL HIGH (ref 0.0–100.0)

## 2022-08-24 LAB — TSH: TSH: 1.92 u[IU]/mL (ref 0.35–5.50)

## 2022-08-24 MED ORDER — AMOXICILLIN-POT CLAVULANATE 500-125 MG PO TABS: 1.0000 | ORAL_TABLET | Freq: Two times a day (BID) | ORAL | 0 refills | Status: DC

## 2022-08-24 NOTE — Progress Notes (Signed)
Subjective:     Patient ID: Renee [name], female    DOB: 1928/11/02, 89 y.o.   MRN: [id number]  Chief Complaint  Patient presents with   Extremity Weakness    HPI Patient is in today for worsening generalized weakness, fatigue,   Decreased urination per caregiver.   Worsening DOE.  Uses oxygen at night.   Full code. Hx of DNR.    CKD and has not seen nephrology.   [facility] Pulmonologist - Dr. Halford Chessman, NP Kathie Dike-  ENT- on [facility] in the past  Depression ?  Hx of CVA in October 2011- left sided weakness       Passed swallowing test   She is still coughing and getting choked.        Health Maintenance Due  Topic Date Due   MAMMOGRAM  08/02/2021   DTaP/Tdap/Td (3 - Tdap) 04/04/2022   COVID-19 Vaccine (4 - 2023-24 season) 04/20/2022    Past Medical History:  Diagnosis Date   Angioedema    possibly from voltaren   Bronchopneumonia 12/11/2016   Degenerative disc disease    Diabetes mellitus    type II   Hyperlipidemia    Hypertension    LVH (left ventricular hypertrophy)    and atrial enlargement by echo in past with nl EF   Nasal pruritis    Osteoarthritis    Osteopenia    Renal insufficiency    Sleep apnea    Stroke ([place]) 05/2010   Small vessel sobcortical (in [place] trial) with Dr Leonie Man, residual L hemiparesis   Vitamin B 12 deficiency 04/08    Past Surgical History:  Procedure Laterality Date   ABDOMINAL HYSTERECTOMY     BSO-fibroids   APPENDECTOMY     BACK SURGERY     COLON SURGERY     due to punctured intestines   EYE SURGERY     cataract extraction   IR THORACENTESIS ASP PLEURAL SPACE W/IMG GUIDE  06/07/2020   KNEE SURGERY     arthroscope   PARS PLANA VITRECTOMY  07/31/2011   Procedure: PARS PLANA VITRECTOMY WITH 25 GAUGE;  Surgeon: [name], MD;  Location: [facility];  Service: Ophthalmology;  Laterality: Right;  REMOVAL OF SILICONE OIL AND LASER RIGHT EYE   RETINAL DETACHMENT  SURGERY  02/18/11   times 2   SPINE SURGERY  08/09   spinal decompression surgery    Family History  Problem Relation Age of Onset   COPD Brother    Cancer Sister        brain tumor with hemmorhage   Heart disease Sister        CAD    Social History   Socioeconomic History   Marital status: Widowed    Spouse name: Not on file   Number of children: Not on file   Years of education: Not on file   Highest education level: Not on file  Occupational History   Not on file  Tobacco Use   Smoking status: Never    Passive exposure: Never   Smokeless tobacco: Never  Vaping Use   Vaping Use: Not on file  Substance and Sexual Activity   Alcohol use: No    Alcohol/week: 0.0 standard drinks of alcohol   Drug use: No   Sexual activity: Never  Other Topics Concern   Not on file  Social History Narrative   Not on file   Social Determinants of Health   Financial  Resource Strain: Low Risk  (05/30/2022)   Overall Financial Resource Strain (CARDIA)    Difficulty of Paying Living Expenses: Not hard at all  Food Insecurity: No Food Insecurity (05/30/2022)   Hunger Vital Sign    Worried About Running Out of Food in the Last Year: Never true    Ran Out of Food in the Last Year: Never true  Transportation Needs: No Transportation Needs (05/30/2022)   PRAPARE - Hydrologist (Medical): No    Lack of Transportation (Non-Medical): No  Physical Activity: Inactive (05/30/2022)   Exercise Vital Sign    Days of Exercise per Week: 0 days    Minutes of Exercise per Session: 0 min  Stress: No Stress Concern Present (05/30/2022)   [place]    Feeling of Stress : Not at all  Social Connections: Unknown (05/30/2022)   Social Connection and Isolation Panel [NHANES]    Frequency of Communication with Friends and Family: More than three times a week    Frequency of Social Gatherings with Friends and  Family: Twice a week    Attends Religious Services: Never    Marine scientist or Organizations: Yes    Attends Music therapist: More than 4 times per year    Marital Status: Patient refused  Intimate Partner Violence: Not At Risk (05/30/2022)   Humiliation, Afraid, Rape, and Kick questionnaire    Fear of Current or Ex-Partner: No    Emotionally Abused: No    Physically Abused: No    Sexually Abused: No    Outpatient Medications Prior to Visit  Medication Sig Dispense Refill   acetaminophen (TYLENOL) 500 MG tablet Take 1,000 mg by mouth at bedtime.     albuterol (PROVENTIL) (2.5 MG/3ML) 0.083% nebulizer solution Take 3 mLs (2.5 mg total) by nebulization every 6 (six) hours as needed for wheezing or shortness of breath. 75 mL 5   amLODipine (NORVASC) 10 MG tablet TAKE 1 TABLET BY MOUTH EVERY DAY (Patient taking differently: Take 10 mg by mouth daily.) 90 tablet 3   aspirin EC 325 MG tablet Take 325 mg by mouth in the morning.     calcium-vitamin D (OSCAL WITH D) 500-200 MG-UNIT per tablet Take 1 tablet by mouth daily.     Cholecalciferol (VITAMIN D-3) 25 MCG (1000 UT) CAPS Take 1,000 Units by mouth in the morning.     Cranberry 500 MG TABS Take 500 mg by mouth in the morning and at bedtime.     CRANBERRY PO Take by mouth.     docusate sodium (COLACE) 100 MG capsule Take 200 mg by mouth at bedtime.     estradiol (ESTRACE) 0.1 MG/GM vaginal cream Place 1 g vaginally daily.     FLONASE 50 MCG/ACT nasal spray Place 2 sprays into both nostrils See admin instructions. Instill 2 sprays into each nostril at bedtime     furosemide (LASIX) 20 MG tablet Take 0.5 tablets (10 mg total) by mouth daily. 45 tablet 0   glipiZIDE (GLUCOTROL XL) 2.5 MG 24 hr tablet Take 1 tablet (2.5 mg total) by mouth daily with breakfast. 90 tablet 0   glucose blood (ONETOUCH ULTRA) test strip CHECK BLOOD SUGAR ONCE DAILY AND AS DIRECTED. 100 strip 11   hydrALAZINE (APRESOLINE) 25 MG tablet Take 1  tablet (25 mg total) by mouth every 8 (eight) hours. 90 tablet 2   Lancets (ONETOUCH DELICA PLUS [id number]) MISC CHECK BLOOD  SUGAR ONCE DAILY AND AS DIRECTED 100 each 2   loratadine (CLARITIN) 10 MG tablet Take 1 tablet (10 mg total) by mouth daily. 30 tablet 1   metoprolol succinate (TOPROL-XL) 25 MG 24 hr tablet Take 12.5 mg by mouth daily.     mirtazapine (REMERON) 15 MG tablet TAKE 1 TABLET BY MOUTH EVERYDAY AT BEDTIME 90 tablet 0   Multiple Vitamin (MULTIVITAMIN) capsule Take 1 capsule by mouth daily.     Multiple Vitamins-Minerals (PRESERVISION AREDS 2+MULTI VIT) CAPS Take 1 capsule by mouth in the morning and at bedtime.     Olopatadine HCl (PATADAY OP) Place 1 drop into both eyes 2 (two) times daily as needed (for itching or redness).     pantoprazole (PROTONIX) 40 MG tablet Take 1 tablet (40 mg total) by mouth daily at 6 (six) AM. 30 tablet 1   pioglitazone (ACTOS) 30 MG tablet Take 1 tablet (30 mg total) by mouth daily. 90 tablet 3   sertraline (ZOLOFT) 50 MG tablet Take 1 tablet (50 mg total) by mouth daily. 90 tablet 1   sitaGLIPtin (JANUVIA) 100 MG tablet Take 1 tablet (100 mg total) by mouth daily. 30 tablet 2   solifenacin (VESICARE) 10 MG tablet TAKE 1 TABLET BY MOUTH EVERY DAY 30 tablet 2   Spacer/Aero-Holding Chambers DEVI 1 each by Does not apply route at bedtime. Use with inhaler. 1 each 0   trandolapril (MAVIK) 4 MG tablet TAKE 1 TABLET DAILY (Patient taking differently: Take 4 mg by mouth daily.) 90 tablet 3   triamcinolone (NASACORT) 55 MCG/ACT AERO nasal inhaler Place 2 sprays into the nose daily. (Patient taking differently: Place 2 sprays into the nose See admin instructions. Instill 2 sprays into each nostril at bedtime when not using Flonase) 1 each 5   vitamin B-12 (CYANOCOBALAMIN) 1000 MCG tablet Take 1,000 mcg by mouth daily.      Cephalexin 250 MG tablet Take 1 tablet (250 mg total) by mouth daily. 30 tablet 2   hydrochlorothiazide (MICROZIDE) 12.5 MG capsule Take 1  capsule (12.5 mg total) by mouth daily. 30 capsule 2   albuterol (VENTOLIN HFA) 108 (90 Base) MCG/ACT inhaler INHALE 2 PUFFS INTO THE LUNGS IN THE MORNING, AT NOON, AND AT BEDTIME. 6.7 each 1   budesonide-formoterol (SYMBICORT) 160-4.5 MCG/ACT inhaler Inhale 2 puffs into the lungs 2 (two) times daily. 10.2 g 11   azithromycin (ZITHROMAX) 250 MG tablet Take 1 tablet (250 mg total) by mouth daily. Take first 2 tablets together, then 1 every day until finished. (Patient not taking: Reported on 08/01/2022) 6 tablet 0   cephALEXin (KEFLEX) 500 MG capsule Take 1 capsule (500 mg total) by mouth 2 (two) times daily. (Patient not taking: Reported on 08/01/2022) 10 capsule 0   predniSONE (DELTASONE) 10 MG tablet Take 2 tablets daily x 5 days (Patient not taking: Reported on 08/24/2022) 10 tablet 0   No facility-administered medications prior to visit.    Allergies  Allergen Reactions   Tape Other (See Comments)    The patient's skin is VERY THIN- will TEAR EASILY!!   Voltaren [Diclofenac] Swelling and Other (See Comments)    Angioedema    ROS     Objective:    Physical Exam  BP (!) 142/62 (BP Location: Left Arm, Patient Position: Sitting, Cuff Size: Large)   Pulse 64   Temp 97.6 F (36.4 C) (Temporal)   Ht '5\' 3"'$  (1.6 m)   SpO2 96%   BMI 31.53 kg/m  Wt  Readings from Last 3 Encounters:  08/01/22 178 lb (80.7 kg)  07/06/22 173 lb (78.5 kg)  03/23/22 173 lb (78.5 kg)       Assessment & Plan:   Problem List Items Addressed This Visit       Cardiovascular and Mediastinum   Chronic diastolic CHF (congestive heart failure) (HCC)   Relevant Orders   CBC with Differential/Platelet   Basic metabolic panel   Brain natriuretic peptide   Essential hypertension   Relevant Orders   AMB Referral to [place] (ACO Patients)   Ambulatory referral to Nephrology   CBC with Differential/Platelet   Basic metabolic panel     Respiratory   Chronic respiratory failure with  hypoxia ([place])   Relevant Orders   DG Chest 2 View (Completed)     Endocrine   Type 2 diabetes, controlled, with retinopathy ([place])   Relevant Orders   AMB Referral to [place] (ACO Patients)     Genitourinary   CKD (chronic kidney disease) stage 4, GFR 15-29 ml/min (HCC)   Relevant Orders   AMB Referral to [place] (ACO Patients)   Ambulatory referral to Nephrology   Basic metabolic panel   Frequent UTI   Relevant Orders   POCT urinalysis dipstick (Completed)   Urine Culture     Other   Anxiety and depression   B12 deficiency   Relevant Orders   Vitamin B12   Fatigue - Primary   Relevant Orders   AMB Referral to [place] (ACO Patients)   Ambulatory referral to Nephrology   CBC with Differential/Platelet   Basic metabolic panel   TSH   Vitamin B12   VITAMIN D 25 Hydroxy (Vit-D Deficiency, Fractures)   Brain natriuretic peptide   DG Chest 2 View (Completed)   Generalized weakness   History of CVA (cerebrovascular accident)   Mobility impaired   Other Visit Diagnoses     Polypharmacy       Relevant Orders   AMB Referral to [place] (ACO Patients)   Ambulatory referral to Nephrology   Chronic cough       Relevant Orders   DG Chest 2 View (Completed)         I have discontinued Renee [name]'s hydrochlorothiazide, cephALEXin, Cephalexin, azithromycin, and predniSONE. I am also having her start on amoxicillin-clavulanate. Additionally, I am having her maintain her multivitamin, calcium-vitamin D, docusate sodium, cyanocobalamin, Olopatadine HCl (PATADAY OP), triamcinolone, PreserVision AREDS 2+Multi Vit, Cranberry, acetaminophen, aspirin EC, Vitamin D-3, Flonase, estradiol, OneTouch Delica Plus BTDHRC16L, pioglitazone, metoprolol succinate, OneTouch Ultra, trandolapril, amLODipine, sertraline, pantoprazole, loratadine, albuterol, Spacer/Aero-Holding Chambers, CRANBERRY PO, mirtazapine,  solifenacin, hydrALAZINE, sitaGLIPtin, budesonide-formoterol, glipiZIDE, furosemide, and albuterol.  Meds ordered this encounter  Medications   amoxicillin-clavulanate (AUGMENTIN) 500-125 MG tablet    Sig: Take 1 tablet by mouth 2 (two) times daily.    Dispense:  14 tablet    Refill:  0    Order Specific Question:   Supervising Provider    Answer:   Pricilla Holm A [[id number]]

## 2022-08-24 NOTE — Progress Notes (Signed)
Please call and let her know that I am sending in an antibiotic due to her abnormal chest x-ray and potential urinary tract infection.  Remind her to increase the Lasix from 10 mg to 20 mg daily.  This is her fluid pill.  Also, I would like to see her back in 1 week or sooner if she is getting worse

## 2022-08-24 NOTE — Patient Instructions (Addendum)
STOP hydrochlorothiazide and take Lasix 20 mg once daily.  Take metoprolol once daily instead of twice daily.  I am referring you to nephrology and they will call you to schedule a visit for your kidney disease.   I am also referring you to a pharmacist to assist with your medications.   I will be in touch with your results from today.   Follow up here in 4 weeks.

## 2022-08-26 NOTE — Progress Notes (Signed)
Please ask her to follow up with her cardiologist due to enlarged heart on chest XR and possible new murmur.

## 2022-08-27 ENCOUNTER — Telehealth: Payer: Self-pay | Admitting: Family Medicine

## 2022-08-27 ENCOUNTER — Ambulatory Visit: Payer: Medicare HMO | Admitting: Urology

## 2022-08-27 DIAGNOSIS — N390 Urinary tract infection, site not specified: Secondary | ICD-10-CM

## 2022-08-27 LAB — URINE CULTURE

## 2022-08-27 NOTE — Telephone Encounter (Signed)
Patient called stating that she would like a callback from the nurse [facility]. Patient stated that she was in the office Friday to see Vickie and she stated that her legs are not any better. Best callback number for patient is [phone].

## 2022-08-28 ENCOUNTER — Other Ambulatory Visit: Payer: Self-pay | Admitting: Family Medicine

## 2022-08-28 ENCOUNTER — Telehealth: Payer: Self-pay | Admitting: Family Medicine

## 2022-08-28 ENCOUNTER — Other Ambulatory Visit: Payer: Self-pay

## 2022-08-28 DIAGNOSIS — I517 Cardiomegaly: Secondary | ICD-10-CM

## 2022-08-28 DIAGNOSIS — N3000 Acute cystitis without hematuria: Secondary | ICD-10-CM

## 2022-08-28 DIAGNOSIS — R011 Cardiac murmur, unspecified: Secondary | ICD-10-CM

## 2022-08-28 DIAGNOSIS — I5032 Chronic diastolic (congestive) heart failure: Secondary | ICD-10-CM

## 2022-08-28 MED ORDER — LEVOFLOXACIN 250 MG PO TABS: 250.0000 mg | ORAL_TABLET | Freq: Every day | ORAL | 0 refills | Status: DC

## 2022-08-28 NOTE — Telephone Encounter (Signed)
Called [place] caregiver and she reports that she would like to see if Renee [name] is okay with ordering Physical Therapy and Sit & Stand for pt as she was reccommended this?

## 2022-08-28 NOTE — Progress Notes (Signed)
   Subjective:    Patient ID: Renee [name], female    DOB: 19-Nov-1928, 89 y.o.   MRN: [id number]  HPI    Review of Systems     Objective:   Physical Exam        Assessment & Plan:

## 2022-08-28 NOTE — Telephone Encounter (Signed)
Called pt back, see previous encounter

## 2022-08-28 NOTE — Telephone Encounter (Signed)
Caregiver is concerned because pt is not able to transfer as well and if cardiology calls to schedule it will be hard for her to take pt. PCP recommends that since it seems like pt is worse it would be best to go to ED as the weakness could just be related to her health conditions at this time. I relayed this to caregiver and she states her health is not worse, its just she is having troubles moving pt and transferring her but will see how she is doing today when she wakes up and if she isnt any better she will take her but will hold off if she is doing better.

## 2022-08-28 NOTE — Progress Notes (Signed)
Her urine culture is resistant to Augmentin so I am sending in a new one now for her to start taking. Stop the Augmentin. If she is not improving or if she is worsening (shortness of breath, fatigue, etc) then I recommend she go to the ED for further evaluation based on her X ray report from Friday.

## 2022-08-28 NOTE — Telephone Encounter (Signed)
Pt is requesting a call from Jarrett Soho to discuss physical therapy orders for the pt's insurance.   Please call the pt at:  [phone]

## 2022-08-29 ENCOUNTER — Telehealth: Payer: Self-pay

## 2022-08-29 ENCOUNTER — Telehealth: Payer: Self-pay | Admitting: Family Medicine

## 2022-08-29 DIAGNOSIS — N3000 Acute cystitis without hematuria: Secondary | ICD-10-CM

## 2022-08-29 MED ORDER — LEVOFLOXACIN 250 MG PO TABS: 250.0000 mg | ORAL_TABLET | Freq: Every day | ORAL | 0 refills | Status: DC

## 2022-08-29 NOTE — Telephone Encounter (Signed)
Patients care giver [name] called and requested a call back from [place]. She said she accidentally dumped out antibiotic this morning and she wants to see if some more can be sent in. Call back is [phone]

## 2022-08-29 NOTE — Telephone Encounter (Signed)
Called and spoke with Ms. Renee [name] who is very upset, she accidentally dropped Levoquin medication and is requesting we resend so pt can finish medication- she reports she has only had 1 dose so will need the other 6 sent in.    Of note: Pt's walking has seemed to be getting a little better. Caregiver reports today pt was able to get up and use her walker for a few steps and did not require as much help with lifting. Renee [name] told caregiver she would like to avoid hospital or ED as much as possible so that she doesn't get sick with all the flu and COVID pts being seen.

## 2022-08-29 NOTE — Telephone Encounter (Signed)
PCP verbal ok- rx resent for 6

## 2022-08-29 NOTE — Progress Notes (Signed)
   Care Guide Note  08/29/2022 Name: Renee [name] MRN: [id number] DOB: 01-08-1929  Referred by: Girtha Rm, NP-C Reason for referral : Care Coordination (Outreach to schedule with Pharm d )   Renee [name] is a 89 y.o. year old female who is a primary care patient of Raenette Rover, Vickie L, NP-C. [name] was referred to the pharmacist for assistance related to DM.    Successful contact was made with the patient to discuss pharmacy services including being ready for the pharmacist to call at least 5 minutes before the scheduled appointment time, to have medication bottles and any blood sugar or blood pressure readings ready for review. The patient agreed to meet with the pharmacist via with the pharmacist via telephone visit on (date/time).  08/31/2022  Noreene Larsson, [place],  [id number] Direct Dial: [phone] [email or web address]

## 2022-08-30 ENCOUNTER — Telehealth: Payer: Self-pay

## 2022-08-30 NOTE — Telephone Encounter (Signed)
Called Renee [name] and was able to discuss how [name] is doing. Pt's legs are still not functioning like they should and caregiver and her husband are still assisting with transferring her. She is able to pivot her legs but weight bearing is still a struggle for her as she is still only doing 2-3 steps. Pt has an appt next Friday at [facility] and is in contact with them. They are having her check in with them over the weekend or even on Monday and if pt is worsening they will get her in sooner but even with the little progression, that is still good and if that continues they will just keep next Friday appt.

## 2022-08-30 NOTE — Telephone Encounter (Signed)
Pt has asked for a call back in regards to health condition. Pt did not specify on exactly what it was she was wanting to discuss. She just asked for Jarrett Soho to call her.  Pts number is [phone]

## 2022-08-31 ENCOUNTER — Other Ambulatory Visit: Payer: PPO

## 2022-08-31 NOTE — Telephone Encounter (Signed)
[name] asked for a call back from [place].  She also said that several of patients family members might try to call and get info from [place]. She said that Her since she is the POA and patient is the only ones that should be given info to.

## 2022-08-31 NOTE — Progress Notes (Signed)
08/31/2022 Name: Renee [name] MRN: [id number] DOB: [birth year]  Chief Complaint  Patient presents with   Medication Problem    Renee [name] is a 89 y.o. year old female who presented for a telephone visit.   They were referred to the pharmacist by their PCP for assistance in managing complex medication management.   Patient is participating in a Managed Medicaid Plan:  No  Subjective: Pharmacy referral to evaluate complex medication list for causes of recent issues with fatigue and generalized weakness.  Verified medication list, how patient is taking, and adherence with care taker, [name].  Care Team: Primary Care Provider: Girtha Rm, NP-C; follow-up visit 09/12/22  Medication Access/Adherence Patient has caretaker 7 days a week that fills twice daily pill organizer for her.  Did note: -only taking 1 tablet of hydralazine daily because patient is typically only awake 8 hours out of the day -only using Symbicort as needed  Current Pharmacy:  [id number] [place] [address]Phone: [phone]: [phone]  Patient reports affordability concerns with their medications: No  Patient reports access/transportation concerns to their pharmacy: No  Patient reports adherence concerns with their medications:  No    Objective: Caretaker endorses recent FBG readings of 217, 222, and 340 Lab Results  Component Value Date   HGBA1C 6.4 07/04/2022  CKD Lab Results  Component Value Date   CREATININE 1.34 (H) 08/24/2022   BUN 21 08/24/2022   NA 139 08/24/2022   K 4.2 08/24/2022   CL 100 08/24/2022   CO2 32 08/24/2022   Lab Results  Component Value Date   CHOL 230 (H) 12/25/2021   HDL 30 (L) 12/25/2021   LDLCALC 146 (H) 12/25/2021   LDLDIRECT 96.0 09/30/2018   TRIG 348 (H) 12/25/2021   CHOLHDL 7.7 (H) 12/25/2021   Medications Reviewed Today     Reviewed by GDarlina Guys RWaller(Pharmacist) on 08/31/22 at 1347  Med List Status: <None>   Medication Order Taking? Sig Documenting Provider Last Dose Status Informant  acetaminophen (TYLENOL) 500 MG tablet [id number] Take 1,000 mg by mouth at bedtime. [provider]  Active Care Giver  albuterol (PROVENTIL) (2.5 MG/3ML) 0.083% nebulizer solution [id number] Take 3 mLs (2.5 mg total) by nebulization every 6 (six) hours as needed for wheezing or shortness of breath. Parrett, TFonnie Mu NP  Active   albuterol (VENTOLIN HFA) 108 (90 Base) MCG/ACT inhaler [id number] INHALE 2 PUFFS INTO THE LUNGS IN THE MORNING, AT NOON, AND AT BEDTIME. SChesley Mires MD  Active   amLODipine (NORVASC) 10 MG tablet [id number] TAKE 1 TABLET BY MOUTH EVERY DAY  Patient taking differently: Take 10 mg by mouth daily.   PSusy Frizzle MD  Active Care Giver, Pharmacy Records  aspirin EC 325 MG tablet [id number] Take 325 mg by mouth in the morning. [provider]  Active Care Giver  budesonide-formoterol ([facility] 160-4.5 MCG/ACT inhaler [id number] Inhale 2 puffs into the lungs 2 (two) times daily. SChesley Mires MD  Active            Med Note (Colin Rhein CNorth DakotaA   Fri Aug 31, 2022  1:17 PM) States only using if needed- counseled on importance of taking routinely  calcium-vitamin D (OSCAL WITH D) 500-200 MG-UNIT per tablet [id number] Take 1 tablet by mouth daily. [provider]  [place]  Cholecalciferol (VITAMIN D-3) 25 MCG (1000 UT) CAPS [id number] Yes Take 1,000 Units by mouth in the morning. [provider]  Active Care Giver  Cranberry 500 MG TABS [id number] Yes Take 500 mg by mouth in the morning and at bedtime. [provider]  Active Care Giver           Med Note Colin Rhein, Delora Gravatt A   Fri Aug 31, 2022  1:19 PM) Taking once a day    Discontinued 08/31/22 1323 (Change in therapy)            Med Note>> Darlina Guys, [facility]   08/31/2022  1:23 PM going to resume BID     docusate sodium (COLACE) 100 MG capsule [id number] Yes Take 200 mg by mouth at bedtime. [provider]  Active Care Giver           Med Note Colin Rhein, Brecklyn Galvis A   Fri Aug 31, 2022  1:24 PM) '100mg'$  in morning and '200mg'$  at night  estradiol (ESTRACE) 0.1 MG/GM vaginal cream [id number] Yes Place 1 g vaginally daily. [provider]  Martinez, Pharmacy Records  FLONASE 50 MCG/ACT nasal spray [id number] Yes Place 2 sprays into both nostrils See admin instructions. Instill 2 sprays into each nostril at bedtime [provider]  Active Care Giver  furosemide (LASIX) 20 MG tablet [id number] Yes Take 0.5 tablets (10 mg total) by mouth daily. Girtha Rm, NP-C  Active            Med Note Colin Rhein, Haydon Kalmar A   Fri Aug 31, 2022  1:29 PM) Taking '20mg'$   glipiZIDE (GLUCOTROL XL) 2.5 MG 24 hr tablet [id number] Yes Take 1 tablet (2.5 mg total) by mouth daily with breakfast. Raenette Rover, Vickie L, NP-C  Active   glucose blood (ONETOUCH ULTRA) test strip [id number]  CHECK BLOOD SUGAR ONCE DAILY AND AS DIRECTED. [name], MD  Active Care Giver  hydrALAZINE (APRESOLINE) 25 MG tablet [id number] Yes Take 1 tablet (25 mg total) by mouth every 8 (eight) hours. Girtha Rm, NP-C  Active            Med Note Colin Rhein, Catrinia Racicot A   Fri Aug 31, 2022  1:30 PM) Only taking 1 tablet daily  Lancets (ONETOUCH DELICA PLUS [id number]) [place] [id number] Yes CHECK BLOOD SUGAR ONCE DAILY AND AS DIRECTED [name], MD  Active Care Giver  levofloxacin [facility]) 250 MG tablet [id number] Yes Take 1 tablet (250 mg total) by mouth daily. Girtha Rm, NP-C  Active            Med Note Colin Rhein, Annalyse Langlais A   Fri Aug 31, 2022  1:19 PM) Taking for UTI- has 4 tablets left    Discontinued 08/31/22 1331 (Patient has not taken in last 30 days)   metoprolol succinate (TOPROL-XL) 25 MG 24 hr tablet [id number] Yes Take 12.5 mg by mouth daily. [provider]  Active Care Giver, Pharmacy Records   mirtazapine (REMERON) 15 MG tablet [id number] Yes TAKE 1 TABLET BY MOUTH EVERYDAY AT BEDTIME Henson, Vickie L, NP-C  Active   Multiple Vitamin (MULTIVITAMIN) capsule [id number] Yes Take 1 capsule by mouth daily. [provider]  Active Care Giver  Multiple Vitamins-Minerals (PRESERVISION AREDS 2+MULTI VIT) CAPS [id number] Yes Take 1 capsule by mouth in the morning and at bedtime. [provider]  Active Care Giver  Olopatadine HCl (PATADAY OP) [id number] Yes Place 1 drop into both eyes 2 (two) times daily as needed (for itching  or redness). [provider]  Active Care Giver  pantoprazole (PROTONIX) 40 MG tablet [id number] Yes Take 1 tablet (40 mg total) by mouth daily at 6 (six) AM. Eugenie Filler, MD  Active            Med Note Colin Rhein, Katyra Tomassetti A   Fri Aug 31, 2022  1:37 PM) They get OTC and she takes at night  pioglitazone (ACTOS) 30 MG tablet [id number] Yes Take 1 tablet (30 mg total) by mouth daily. [name], MD  Active Care Giver, Pharmacy Records  sertraline (ZOLOFT) 50 MG tablet [id number] Yes Take 1 tablet (50 mg total) by mouth daily. [name], MD  Active Care Giver, Pharmacy Records  sitaGLIPtin [facility]) 100 MG tablet [id number] Yes Take 1 tablet (100 mg total) by mouth daily. Harland Dingwall L, NP-C  Active   solifenacin (VESICARE) 10 MG tablet [id number] Yes TAKE 1 TABLET BY MOUTH EVERY DAY Henson, Vickie L, NP-C  Active            Med Note Colin Rhein, Wilian Kwong A   Fri Aug 31, 2022  1:44 PM) Holding until appt with kidney doctor  Spacer/Aero-Holding Dorise Bullion [id number] Yes 1 each by Does not apply route at bedtime. Use with inhaler. Chesley Mires, MD  Active   trandolapril (MAVIK) 4 MG tablet [id number] Yes TAKE 1 TABLET DAILY  Patient taking differently: Take 4 mg by mouth daily.   [name], MD  Active Care Giver, Pharmacy Records   Patient taking differently:   Discontinued 08/31/22 1346 (Change in therapy)          Med Note>> Darlina Guys, [facility]   08/31/2022  1:46 PM only using fonase now    vitamin B-12 (CYANOCOBALAMIN) 1000 MCG tablet [id number] Yes Take 1,000 mcg by mouth daily.  [provider]  Active Care Giver           Med Note Dorina Hoyer, [facility] P   Wed Jul 01, 2019 10:29 AM)              Assessment/Plan:  -Completed 60 minute compete medication review and evaluation of adherence and patient's current condition -Recently diagnosed with an UTI, and some mobility and mentation seems to be improving since beginning of abx -[name], caregiver, endorses decreased mood and motivation and increased anxiety and worry -Referrals are in for cards and nephro, but in the mean time, medication recommendations identified include: Unsure of indication for routine APAP '1000mg'$  hs; caretaker stated no pain- will d/c giving patient this medication except PRN Current furosemide prescription states to take one-half of '20mg'$  tablet; and caretaker was expecting '20mg'$  daily 3.  Glipizide not recommended in geriatric population, but it is a low dose w/no reported hypoglycemia.  Pioglitazone not recommeded w/HF diagnosis or renail impairment- recommend discontinuing.  Could consider SGLT2, but cost could be a barrier; and most recent A1c 6.4%. 4.  Using Symbicort inhaler as needed for SOB/wheezing; educated that this is for maintenance and to be used routinely- nebulizer treatments and albuterol inhaler are as needed for rescue. 5.  Caretaker, [name], endorses decreased mood and motivation in [place]; also increased anxiety and worry.  Could increase sertraline dose to '100mg'$  daily.  May also consider tapering/stopping mirtazapine (TCA's not recommended in geriatric population).  [place] is only getting 1 dose of hydralazine '25mg'$  daily due to only being awake 8 hours out of the day.  Very small benefit with this dosing, likely.  Recommend  BP evaluation at 1/24 appointment since increasing furosemide and decreasing  metoprolol to see if stopping hydralazine is reasonable.  Follow Up Plan: Will plan to follow-up with patient in a few weeks pending any medication changes at upcoming PCP appointment 09/12/22.  Thank you for involving me in the care of this patient.  Please do not hesitate to reach out with any further needs!

## 2022-09-03 ENCOUNTER — Telehealth: Payer: Self-pay | Admitting: Family Medicine

## 2022-09-03 NOTE — Telephone Encounter (Signed)
Please see other encounter.

## 2022-09-03 NOTE — Telephone Encounter (Signed)
Patient called requesting a callback from [facility]. Patient didn't go in to detail but she did say that its about her office notes from when she saw Vickie. Best callback number for patient is [phone].

## 2022-09-03 NOTE — Telephone Encounter (Signed)
Called and spoke with Ms. Renee [name] who is concerned regarding pt's family calling in and getting information. The family is in town as of last week and according to caregiver, have been giving pt a hard time. On Friday they were over discussing her healthcare and were not happy with how some things were and when conversation started to elevate and progress the wrong way, it started to make pt angry and she asked the family to leave. I reassured her that since we do not have a DPR on file for them that she does not need to worry. Renee [name] states that they will try to bring out the old Living Will of the pt to where sister is POA but that Renee [name] is actually [name]. I let her know the most updated one we have is 2022 and it does not state the POA. I advised that at her convenience, if she would like to bring the new updated copy that was done October 2023 we can scan that into the system and while she is here get an updated DPR as well. Renee [name] was happy with this and states she will get together the copies and bring them to [place].

## 2022-09-04 NOTE — Progress Notes (Signed)
   Care Guide Note  08/29/2022 Name: TAKASHA VETERE MRN: [id number] DOB: November 24, 1928  Referred by: Girtha Rm, NP-C Reason for referral : No chief complaint on file.   TREESA MCCULLY is a 89 y.o. year old female who is a primary care patient of Raenette Rover, Vickie L, NP-C. Khadija A Klump was referred to the pharmacist for assistance related to DM.    Successful contact was made with the patient to discuss pharmacy services including being ready for the pharmacist to call at least 5 minutes before the scheduled appointment time, to have medication bottles and any blood sugar or blood pressure readings ready for review. The patient agreed to meet with the pharmacist via with the pharmacist via telephone visit on (date/time).  08/31/2022  Noreene Larsson, [place], [place] [address] Direct Dial: [phone] [email or web address]

## 2022-09-06 NOTE — Telephone Encounter (Signed)
[name] called back and requesting a call back from [place] again

## 2022-09-06 NOTE — Telephone Encounter (Signed)
Spoke with caregiver [name] regarding pt's continued leg weakness. She reports that pt is able to kick her legs very well and move them around but when it comes to standing she has a lot of trouble bearing weight on them. She is still going to see [facility] tomorrow and possibly discuss them ordering physical therapy for pt. I advised her we can discuss what they find at upcoming visit we have scheduled for next week.   Pt also wants to discuss at upcoming visit a possible increase in her depression medication as things at home are weighing on her more since her family is in town. [name] reports the family has now filed against [name] to take away her POA rights and they are trying to say pt is "not of sound mind" when pt is fully capable. They have also started a petition on social media [place] and trying to get people against her which is working [employer] up. Adin and [name] have told the family multiple times Donetta does not want to see them but they continue to work things up. Appointment was made to discuss.   [name] also reports that pt will bring updated ACP documents to visit so we can upload in chart as they just finished finalizing with lawyer. Holiday will also update her DPR at this visit.

## 2022-09-06 NOTE — Patient Instructions (Signed)
[name],  It was a pleasure to speak with you again today.  After our discussion and my evaluation of Seng's medications, I have sent some recommendations to [place].  Hopefully she will have a chance to review these and discuss with you all at the upcoming appointment.  I also made sure she is aware the furosemide prescription never got updated to '20mg'$  daily, as intended from the last visit.  And, as a reminder for you all from what we discussed last week:  the Symbicort inhaler is to be used routinely, not as a rescue inhaler.  The nebulizer solutions and albuterol inhaler are for those other times in the day if Shayle needs for shortness of breath or wheezing.  I will follow up with you all a couple of weeks after your appointment with Vickie to see if/what changes were made and see how things are going.  Have a great rest of your afternoon!  Paulina Fusi, PharmD, DPLA

## 2022-09-07 ENCOUNTER — Telehealth: Payer: Self-pay | Admitting: Family Medicine

## 2022-09-07 DIAGNOSIS — M5451 Vertebrogenic low back pain: Secondary | ICD-10-CM | POA: Diagnosis not present

## 2022-09-07 NOTE — Telephone Encounter (Signed)
Patient is refusing to go to the hospital - her care giver would like you to call her asap  [name]  [phone]

## 2022-09-07 NOTE — Telephone Encounter (Signed)
Patients sister called asking to speak with Vickie because she is concerned about the health of the patient. I let her know that without having a POA or being on patients DPR we would not be able to speak to her. She said [name] is not taking care of the patient but she will get with her Attorney and get back to [place].

## 2022-09-07 NOTE — Telephone Encounter (Signed)
Pts caregiver [name] states she re-checked all of Aamari's vitals and she is doing fine.  BP is reading at: 170/95 Temp: 97.6 F  She was advised they do have an apptmnt next week. However, [name] has stated pt has ORTHO apptmnt today and if her temp or BP is elevated then she will take her on over to the ED if she has to.

## 2022-09-12 ENCOUNTER — Ambulatory Visit (INDEPENDENT_AMBULATORY_CARE_PROVIDER_SITE_OTHER): Payer: PPO

## 2022-09-12 ENCOUNTER — Ambulatory Visit (INDEPENDENT_AMBULATORY_CARE_PROVIDER_SITE_OTHER): Payer: PPO | Admitting: Family Medicine

## 2022-09-12 ENCOUNTER — Encounter: Payer: Self-pay | Admitting: Family Medicine

## 2022-09-12 VITALS — BP 132/60 | HR 67 | Temp 97.8°F | Ht 63.0 in | Wt 167.0 lb

## 2022-09-12 DIAGNOSIS — Z7409 Other reduced mobility: Secondary | ICD-10-CM | POA: Diagnosis not present

## 2022-09-12 DIAGNOSIS — F32A Depression, unspecified: Secondary | ICD-10-CM

## 2022-09-12 DIAGNOSIS — R5383 Other fatigue: Secondary | ICD-10-CM

## 2022-09-12 DIAGNOSIS — I5032 Chronic diastolic (congestive) heart failure: Secondary | ICD-10-CM

## 2022-09-12 DIAGNOSIS — J90 Pleural effusion, not elsewhere classified: Secondary | ICD-10-CM

## 2022-09-12 DIAGNOSIS — Z0189 Encounter for other specified special examinations: Secondary | ICD-10-CM | POA: Diagnosis not present

## 2022-09-12 DIAGNOSIS — I69352 Hemiplegia and hemiparesis following cerebral infarction affecting left dominant side: Secondary | ICD-10-CM

## 2022-09-12 DIAGNOSIS — I10 Essential (primary) hypertension: Secondary | ICD-10-CM

## 2022-09-12 DIAGNOSIS — R5381 Other malaise: Secondary | ICD-10-CM | POA: Diagnosis not present

## 2022-09-12 DIAGNOSIS — J9811 Atelectasis: Secondary | ICD-10-CM | POA: Diagnosis not present

## 2022-09-12 DIAGNOSIS — Z8673 Personal history of transient ischemic attack (TIA), and cerebral infarction without residual deficits: Secondary | ICD-10-CM | POA: Diagnosis not present

## 2022-09-12 DIAGNOSIS — J9611 Chronic respiratory failure with hypoxia: Secondary | ICD-10-CM | POA: Diagnosis not present

## 2022-09-12 DIAGNOSIS — F419 Anxiety disorder, unspecified: Secondary | ICD-10-CM | POA: Diagnosis not present

## 2022-09-12 MED ORDER — SERTRALINE HCL 100 MG PO TABS: 100.0000 mg | ORAL_TABLET | Freq: Every day | ORAL | 2 refills | Status: DC

## 2022-09-12 NOTE — Progress Notes (Signed)
Subjective:     Patient ID: Renee [name], female    DOB: 1929-07-06, 89 y.o.   MRN: [id number]  Chief Complaint  Patient presents with   Depression    Discuss dosage increase    HPI Patient is in today for anxiety and stress. Her caregiver, Renee [name] is with her.  Patient states she "I am agitated that my family is coming around and aggravating me". Caregiver states they need a letter stating Renee [name] is mentally competent to make her own decisions. She has not seen a neurologist in years since after her stroke.   She continues having weakness of her left side. Becoming weaker and is starting PT soon. She saw Dr. Tonita Cong with [facility] who ordered a sit/stand chair and PT.  Caregiver states it was not the correct order and they told her that her PCP could order the correct DME.  Requesting DME order today so they can get started with PT to work on patient strength and mobility.   Reviewed note from pharmacist and recommendations to increase her sertraline and potentially decreased mirtazapine for depression and anxiety.   She has not yet scheduled with nephrologist for CKD.   Has upcoming appt with cardiology.   Reports feeling better today. No longer having shortness of breath. Fatigue has improved. LE edema improved.   Denies fever, chills, dizziness, chest pain, palpitations, shortness of breath, abdominal pain, N/V/D, urinary symptoms.     Health Maintenance Due  Topic Date Due   MAMMOGRAM  08/02/2021   DTaP/Tdap/Td (3 - Tdap) 04/04/2022    Past Medical History:  Diagnosis Date   Angioedema    possibly from voltaren   Bronchopneumonia 12/11/2016   Degenerative disc disease    Diabetes mellitus    type II   Hyperlipidemia    Hypertension    LVH (left ventricular hypertrophy)    and atrial enlargement by echo in past with nl EF   Nasal pruritis    Osteoarthritis    Osteopenia    Renal insufficiency    Sleep apnea    Stroke ([place]) 05/2010   Small vessel  sobcortical (in [place] trial) with Dr Leonie Man, residual L hemiparesis   Vitamin B 12 deficiency 04/08    Past Surgical History:  Procedure Laterality Date   ABDOMINAL HYSTERECTOMY     BSO-fibroids   APPENDECTOMY     BACK SURGERY     COLON SURGERY     due to punctured intestines   EYE SURGERY     cataract extraction   IR THORACENTESIS ASP PLEURAL SPACE W/IMG GUIDE  06/07/2020   KNEE SURGERY     arthroscope   PARS PLANA VITRECTOMY  07/31/2011   Procedure: PARS PLANA VITRECTOMY WITH 25 GAUGE;  Surgeon: [name], MD;  Location: [facility];  Service: Ophthalmology;  Laterality: Right;  REMOVAL OF SILICONE OIL AND LASER RIGHT EYE   RETINAL DETACHMENT SURGERY  02/18/11   times 2   SPINE SURGERY  08/09   spinal decompression surgery    Family History  Problem Relation Age of Onset   COPD Brother    Cancer Sister        brain tumor with hemmorhage   Heart disease Sister        CAD    Social History   Socioeconomic History   Marital status: Widowed    Spouse name: Not on file   Number of children: Not on file   Years of education: Not on file  Highest education level: Not on file  Occupational History   Not on file  Tobacco Use   Smoking status: Never    Passive exposure: Never   Smokeless tobacco: Never  Vaping Use   Vaping Use: Not on file  Substance and Sexual Activity   Alcohol use: No    Alcohol/week: 0.0 standard drinks of alcohol   Drug use: No   Sexual activity: Never  Other Topics Concern   Not on file  Social History Narrative   Not on file   Social Determinants of Health   Financial Resource Strain: Low Risk  (05/30/2022)   Overall Financial Resource Strain (CARDIA)    Difficulty of Paying Living Expenses: Not hard at all  Food Insecurity: No Food Insecurity (05/30/2022)   Hunger Vital Sign    Worried About Running Out of Food in the Last Year: Never true    Ran Out of Food in the Last Year: Never true  Transportation Needs: No Transportation Needs  (05/30/2022)   PRAPARE - Hydrologist (Medical): No    Lack of Transportation (Non-Medical): No  Physical Activity: Inactive (05/30/2022)   Exercise Vital Sign    Days of Exercise per Week: 0 days    Minutes of Exercise per Session: 0 min  Stress: No Stress Concern Present (05/30/2022)   Renee [name]    Feeling of Stress : Not at all  Social Connections: Unknown (05/30/2022)   Social Connection and Isolation Panel [NHANES]    Frequency of Communication with Friends and Family: More than three times a week    Frequency of Social Gatherings with Friends and Family: Twice a week    Attends Religious Services: Never    Marine scientist or Organizations: Yes    Attends Music therapist: More than 4 times per year    Marital Status: Patient refused  Intimate Partner Violence: Not At Risk (05/30/2022)   Humiliation, Afraid, Rape, and Kick questionnaire    Fear of Current or Ex-Partner: No    Emotionally Abused: No    Physically Abused: No    Sexually Abused: No    Outpatient Medications Prior to Visit  Medication Sig Dispense Refill   albuterol (PROVENTIL) (2.5 MG/3ML) 0.083% nebulizer solution Take 3 mLs (2.5 mg total) by nebulization every 6 (six) hours as needed for wheezing or shortness of breath. 75 mL 5   albuterol (VENTOLIN HFA) 108 (90 Base) MCG/ACT inhaler INHALE 2 PUFFS INTO THE LUNGS IN THE MORNING, AT NOON, AND AT BEDTIME. 6.7 each 1   amLODipine (NORVASC) 10 MG tablet TAKE 1 TABLET BY MOUTH EVERY DAY (Patient taking differently: Take 10 mg by mouth daily.) 90 tablet 3   aspirin EC 325 MG tablet Take 325 mg by mouth in the morning.     budesonide-formoterol (SYMBICORT) 160-4.5 MCG/ACT inhaler Inhale 2 puffs into the lungs 2 (two) times daily. 10.2 g 11   calcium-vitamin D (OSCAL WITH D) 500-200 MG-UNIT per tablet Take 1 tablet by mouth daily.     Cholecalciferol  (VITAMIN D-3) 25 MCG (1000 UT) CAPS Take 1,000 Units by mouth in the morning.     Cranberry 500 MG TABS Take 500 mg by mouth in the morning and at bedtime.     docusate sodium (COLACE) 100 MG capsule Take 200 mg by mouth at bedtime.     estradiol (ESTRACE) 0.1 MG/GM vaginal cream Place 1 g vaginally daily.  FLONASE 50 MCG/ACT nasal spray Place 2 sprays into both nostrils See admin instructions. Instill 2 sprays into each nostril at bedtime     furosemide (LASIX) 20 MG tablet Take 0.5 tablets (10 mg total) by mouth daily. 45 tablet 0   glipiZIDE (GLUCOTROL XL) 2.5 MG 24 hr tablet Take 1 tablet (2.5 mg total) by mouth daily with breakfast. 90 tablet 0   glucose blood (ONETOUCH ULTRA) test strip CHECK BLOOD SUGAR ONCE DAILY AND AS DIRECTED. 100 strip 11   hydrALAZINE (APRESOLINE) 25 MG tablet Take 1 tablet (25 mg total) by mouth every 8 (eight) hours. 90 tablet 2   Lancets (ONETOUCH DELICA PLUS [id number]) MISC CHECK BLOOD SUGAR ONCE DAILY AND AS DIRECTED 100 each 2   levofloxacin (LEVAQUIN) 250 MG tablet Take 1 tablet (250 mg total) by mouth daily. 6 tablet 0   metoprolol succinate (TOPROL-XL) 25 MG 24 hr tablet Take 12.5 mg by mouth daily.     mirtazapine (REMERON) 15 MG tablet TAKE 1 TABLET BY MOUTH EVERYDAY AT BEDTIME 90 tablet 0   Multiple Vitamin (MULTIVITAMIN) capsule Take 1 capsule by mouth daily.     Multiple Vitamins-Minerals (PRESERVISION AREDS 2+MULTI VIT) CAPS Take 1 capsule by mouth in the morning and at bedtime.     Olopatadine HCl (PATADAY OP) Place 1 drop into both eyes 2 (two) times daily as needed (for itching or redness).     pantoprazole (PROTONIX) 40 MG tablet Take 1 tablet (40 mg total) by mouth daily at 6 (six) AM. 30 tablet 1   pioglitazone (ACTOS) 30 MG tablet Take 1 tablet (30 mg total) by mouth daily. 90 tablet 3   sitaGLIPtin (JANUVIA) 100 MG tablet Take 1 tablet (100 mg total) by mouth daily. 30 tablet 2   solifenacin (VESICARE) 10 MG tablet TAKE 1 TABLET BY MOUTH  EVERY DAY 30 tablet 2   Spacer/Aero-Holding Chambers DEVI 1 each by Does not apply route at bedtime. Use with inhaler. 1 each 0   trandolapril (MAVIK) 4 MG tablet TAKE 1 TABLET DAILY (Patient taking differently: Take 4 mg by mouth daily.) 90 tablet 3   vitamin B-12 (CYANOCOBALAMIN) 1000 MCG tablet Take 1,000 mcg by mouth daily.      sertraline (ZOLOFT) 50 MG tablet Take 1 tablet (50 mg total) by mouth daily. 90 tablet 1   acetaminophen (TYLENOL) 500 MG tablet Take 1,000 mg by mouth at bedtime. (Patient not taking: Reported on 09/12/2022)     No facility-administered medications prior to visit.    Allergies  Allergen Reactions   Tape Other (See Comments)    The patient's skin is VERY THIN- will TEAR EASILY!!   Voltaren [Diclofenac] Swelling and Other (See Comments)    Angioedema    ROS     Objective:    Physical Exam Constitutional:      General: She is not in acute distress.    Appearance: She is not ill-appearing.  HENT:     Mouth/Throat:     Mouth: Mucous membranes are moist.  Eyes:     Conjunctiva/sclera: Conjunctivae normal.  Cardiovascular:     Rate and Rhythm: Normal rate and regular rhythm.     Heart sounds: Murmur heard.  Pulmonary:     Effort: Pulmonary effort is normal.     Breath sounds: Normal breath sounds.  Musculoskeletal:     Right lower leg: Edema present.     Left lower leg: Edema present.  Skin:    General: Skin is warm and  dry.  Neurological:     General: No focal deficit present.     Mental Status: She is alert and oriented to person, place, and time.     Motor: Weakness present.     Gait: Gait abnormal.     Comments: Chronic left sided arm weakness  Psychiatric:        Attention and Perception: Attention normal.        Mood and Affect: Mood normal.        Speech: Speech normal.        Behavior: Behavior normal.     BP 132/60 (BP Location: Left Arm, Patient Position: Sitting, Cuff Size: Large)   Pulse 67   Temp 97.8 F (36.6 C)  (Temporal)   Ht '5\' 3"'$  (1.6 m)   Wt 167 lb (75.8 kg)   SpO2 95%   BMI 29.58 kg/m  Wt Readings from Last 3 Encounters:  09/12/22 167 lb (75.8 kg)  08/01/22 178 lb (80.7 kg)  07/06/22 173 lb (78.5 kg)       Assessment & Plan:   Problem List Items Addressed This Visit       Cardiovascular and Mediastinum   Chronic diastolic CHF (congestive heart failure) (HCC)    Decreased LE edema today.  Lungs CTA.  Repeat chest x-ray due to recent moderate pleural effusion.  Overall she seems improved Continue current medication regimen      Relevant Orders   For home use only DME Other see comment   DG Chest 2 View (Completed)   Essential hypertension - Primary    Blood pressure at goal.  Continue current medications and low-sodium diet.      Relevant Orders   For home use only DME Other see comment     Respiratory   Chronic respiratory failure with hypoxia ([place])   Relevant Orders   For home use only DME Other see comment   Pleural effusion    Recheck with chest x-ray.  Symptoms have improved.  Upcoming cardiology appointment.      Relevant Orders   DG Chest 2 View (Completed)     Nervous and Auditory   Hemiplegia, late effect of cerebrovascular disease ([place])   Relevant Orders   For home use only DME Other see comment   Ambulatory referral to Neurology     Other   Anxiety and depression    Increase sertraline from 50 mg to 100 mg.  Continue mirtazapine for now but we will discuss weaning off of this at her follow-up visit.      Relevant Medications   sertraline (ZOLOFT) 100 MG tablet   Encounter for competency evaluation    Caregiver reports needing letter stating patient is mentally competent to make her own decisions.  I will refer her to neurology for further evaluation.      Relevant Orders   Ambulatory referral to Neurology   Fatigue    Slightly improved since last visit.  Orthopedist ordered physical therapy.  I am ordering a sit/stand chair to assist her with  PT      Relevant Orders   DG Chest 2 View (Completed)   History of CVA (cerebrovascular accident)    Follow-up with neurology.      Relevant Orders   Ambulatory referral to Neurology   Mobility impaired    She will start PT.  Sit/stand chair ordered.  Letter provided stating patient unable to perform ADLs without assistance due to CVA per patient caregiver request.  Relevant Orders   For home use only DME Other see comment   Physical deconditioning   Relevant Orders   For home use only DME Other see comment        I have discontinued Cassey A. Steinruck's sertraline. I am also having her start on sertraline. Additionally, I am having her maintain her multivitamin, calcium-vitamin D, docusate sodium, cyanocobalamin, Olopatadine HCl (PATADAY OP), PreserVision AREDS 2+Multi Vit, Cranberry, acetaminophen, aspirin EC, Vitamin D-3, Flonase, estradiol, OneTouch Delica Plus [id number], pioglitazone, metoprolol succinate, OneTouch Ultra, trandolapril, amLODipine, pantoprazole, albuterol, Spacer/Aero-Holding Chambers, mirtazapine, solifenacin, hydrALAZINE, sitaGLIPtin, budesonide-formoterol, glipiZIDE, furosemide, albuterol, and levofloxacin.  Meds ordered this encounter  Medications   sertraline (ZOLOFT) 100 MG tablet    Sig: Take 1 tablet (100 mg total) by mouth daily.    Dispense:  30 tablet    Refill:  2    Order Specific Question:   Supervising Provider    Answer:   Pricilla Holm A [[id number]]

## 2022-09-12 NOTE — Patient Instructions (Signed)
I increased your sertraline dose from 50 mg to 100 mg daily.  This is for your anxiety and depression.  Continue your current medications as prescribed.  I placed a referral to [facility] neurology and they will call you to schedule a visit.  Please call and schedule with [facility].  You were last there in 2022 for chronic kidney disease.

## 2022-09-13 ENCOUNTER — Telehealth: Payer: Self-pay | Admitting: Family Medicine

## 2022-09-13 NOTE — Telephone Encounter (Signed)
Renee [name] called and said that [employer] received the equipment order, but they said it was unclear what was needed. They asked for the order to be re-sent. Callback at [phone]

## 2022-09-13 NOTE — Telephone Encounter (Signed)
Refaxed rx and documentation to [facility] at fax#: [phone]  Received confirmation fax

## 2022-09-13 NOTE — Telephone Encounter (Signed)
Patient in office and brought in new signed POA forms, can be found in Demographics tab as well as Media tab

## 2022-09-14 ENCOUNTER — Telehealth: Payer: Self-pay | Admitting: Family Medicine

## 2022-09-14 DIAGNOSIS — H65491 Other chronic nonsuppurative otitis media, right ear: Secondary | ICD-10-CM | POA: Diagnosis not present

## 2022-09-14 DIAGNOSIS — Z0189 Encounter for other specified special examinations: Secondary | ICD-10-CM | POA: Insufficient documentation

## 2022-09-14 DIAGNOSIS — K219 Gastro-esophageal reflux disease without esophagitis: Secondary | ICD-10-CM | POA: Diagnosis not present

## 2022-09-14 DIAGNOSIS — Z9622 Myringotomy tube(s) status: Secondary | ICD-10-CM | POA: Diagnosis not present

## 2022-09-14 DIAGNOSIS — R1314 Dysphagia, pharyngoesophageal phase: Secondary | ICD-10-CM | POA: Diagnosis not present

## 2022-09-14 NOTE — Assessment & Plan Note (Addendum)
She will start PT.  Sit/stand chair ordered.  Letter provided stating patient unable to perform ADLs without assistance due to CVA per patient caregiver request.

## 2022-09-14 NOTE — Assessment & Plan Note (Signed)
Slightly improved since last visit.  Orthopedist ordered physical therapy.  I am ordering a sit/stand chair to assist her with PT

## 2022-09-14 NOTE — Assessment & Plan Note (Addendum)
Decreased LE edema today.  Lungs CTA.  Repeat chest x-ray due to recent moderate pleural effusion.  Overall she seems improved Continue current medication regimen

## 2022-09-14 NOTE — Assessment & Plan Note (Signed)
Caregiver reports needing letter stating patient is mentally competent to make her own decisions.  I will refer her to neurology for further evaluation.

## 2022-09-14 NOTE — Telephone Encounter (Signed)
PCP closed note and was able to refax over to [employer]. Spoke with Helene Kelp at [phone] and informed her clinic notes have been sent. She confirmed they have been received and she will start processing.

## 2022-09-14 NOTE — Assessment & Plan Note (Signed)
Blood pressure at goal.  Continue current medications and low-sodium diet.

## 2022-09-14 NOTE — Telephone Encounter (Signed)
[employer] called about a lift. They needed additional information and will be sending a fax.

## 2022-09-14 NOTE — Telephone Encounter (Signed)
Notified PCP to please close chart as we need completed notes to send. Waiting on PCP to close chart

## 2022-09-14 NOTE — Assessment & Plan Note (Signed)
Follow-up with neurology 

## 2022-09-14 NOTE — Assessment & Plan Note (Signed)
Recheck with chest x-ray.  Symptoms have improved.  Upcoming cardiology appointment.

## 2022-09-14 NOTE — Assessment & Plan Note (Signed)
Increase sertraline from 50 mg to 100 mg.  Continue mirtazapine for now but we will discuss weaning off of this at her follow-up visit.

## 2022-09-17 DIAGNOSIS — J9601 Acute respiratory failure with hypoxia: Secondary | ICD-10-CM | POA: Diagnosis not present

## 2022-09-18 ENCOUNTER — Telehealth: Payer: Self-pay | Admitting: Family Medicine

## 2022-09-18 DIAGNOSIS — Z87440 Personal history of urinary (tract) infections: Secondary | ICD-10-CM | POA: Diagnosis not present

## 2022-09-18 DIAGNOSIS — E1122 Type 2 diabetes mellitus with diabetic chronic kidney disease: Secondary | ICD-10-CM | POA: Diagnosis not present

## 2022-09-18 DIAGNOSIS — I69354 Hemiplegia and hemiparesis following cerebral infarction affecting left non-dominant side: Secondary | ICD-10-CM | POA: Diagnosis not present

## 2022-09-18 DIAGNOSIS — Z9181 History of falling: Secondary | ICD-10-CM | POA: Diagnosis not present

## 2022-09-18 DIAGNOSIS — F419 Anxiety disorder, unspecified: Secondary | ICD-10-CM | POA: Diagnosis not present

## 2022-09-18 DIAGNOSIS — I129 Hypertensive chronic kidney disease with stage 1 through stage 4 chronic kidney disease, or unspecified chronic kidney disease: Secondary | ICD-10-CM | POA: Diagnosis not present

## 2022-09-18 DIAGNOSIS — F32A Depression, unspecified: Secondary | ICD-10-CM | POA: Diagnosis not present

## 2022-09-18 DIAGNOSIS — N184 Chronic kidney disease, stage 4 (severe): Secondary | ICD-10-CM | POA: Diagnosis not present

## 2022-09-18 DIAGNOSIS — G4730 Sleep apnea, unspecified: Secondary | ICD-10-CM | POA: Diagnosis not present

## 2022-09-18 DIAGNOSIS — Z7984 Long term (current) use of oral hypoglycemic drugs: Secondary | ICD-10-CM | POA: Diagnosis not present

## 2022-09-18 DIAGNOSIS — Z7982 Long term (current) use of aspirin: Secondary | ICD-10-CM | POA: Diagnosis not present

## 2022-09-18 DIAGNOSIS — M5451 Vertebrogenic low back pain: Secondary | ICD-10-CM | POA: Diagnosis not present

## 2022-09-18 NOTE — Telephone Encounter (Signed)
Called and spoke with caregiver regarding pt. She reports pt has been sleeping a lot more often and is not sure if this has to do with her depression and what she's going through right now or if it is something else going on with her health. Thinks maybe she needs to schedule with Neuro? I advised after speaking with PCP that this is normal due to her age and the stress she is undergoing at this time. [name] verbalized understanding and wanted to update [place] that pt has received the transfer equipment, is scheduled to start PT this week and has spoke with [facility] and they will have an appointment within the next couple weeks.

## 2022-09-18 NOTE — Telephone Encounter (Signed)
PT's emergency contact reaches out today requesting a call back from [place] when possible. Contact did not express what this was in regards too.  CB: [phone]

## 2022-09-19 NOTE — Progress Notes (Deleted)
Cardiology Office Note:    Date:  09/19/2022   ID:  Renee [name], DOB [birth year], MRN [id number]  PCP:  Girtha Rm, NP-C   [place] Providers Cardiologist:  None { Click to update primary MD,subspecialty MD or APP then REFRESH:1}    Referring MD: Girtha Rm, NP-C   No chief complaint on file. ***  History of Present Illness:    Renee [name] is a 89 y.o. female seen at the request of Harland Dingwall NP-C for evaluation of diastolic CHF. She has a history of DM type 2, HTN, HLD. Prior CVA in 2011. OSA. She was admitted in May 2023 with sepsis and respiratory failure. Echo at that time showed normal EF. Grade 2 diastolic dysfunction and moderate pericardial effusion.   Past Medical History:  Diagnosis Date   Angioedema    possibly from voltaren   Bronchopneumonia 12/11/2016   Degenerative disc disease    Diabetes mellitus    type II   Hyperlipidemia    Hypertension    LVH (left ventricular hypertrophy)    and atrial enlargement by echo in past with nl EF   Nasal pruritis    Osteoarthritis    Osteopenia    Renal insufficiency    Sleep apnea    Stroke ([place]) 05/2010   Small vessel sobcortical (in [place] trial) with Dr Leonie Man, residual L hemiparesis   Vitamin B 12 deficiency 04/08    Past Surgical History:  Procedure Laterality Date   ABDOMINAL HYSTERECTOMY     BSO-fibroids   APPENDECTOMY     BACK SURGERY     COLON SURGERY     due to punctured intestines   EYE SURGERY     cataract extraction   IR THORACENTESIS ASP PLEURAL SPACE W/IMG GUIDE  06/07/2020   KNEE SURGERY     arthroscope   PARS PLANA VITRECTOMY  07/31/2011   Procedure: PARS PLANA VITRECTOMY WITH 25 GAUGE;  Surgeon: [name], MD;  Location: [facility];  Service: Ophthalmology;  Laterality: Right;  REMOVAL OF SILICONE OIL AND LASER RIGHT EYE   RETINAL DETACHMENT SURGERY  02/18/11   times 2   SPINE SURGERY  08/09   spinal decompression surgery    Current Medications: No  outpatient medications have been marked as taking for the 09/28/22 encounter (Appointment) with [place], Barton Want M, MD.     Allergies:   Tape and Voltaren [diclofenac]   Social History   Socioeconomic History   Marital status: Widowed    Spouse name: Not on file   Number of children: Not on file   Years of education: Not on file   Highest education level: Not on file  Occupational History   Not on file  Tobacco Use   Smoking status: Never    Passive exposure: Never   Smokeless tobacco: Never  Vaping Use   Vaping Use: Not on file  Substance and Sexual Activity   Alcohol use: No    Alcohol/week: 0.0 standard drinks of alcohol   Drug use: No   Sexual activity: Never  Other Topics Concern   Not on file  Social History Narrative   Not on file   Social Determinants of Health   Financial Resource Strain: Low Risk  (05/30/2022)   Overall Financial Resource Strain (CARDIA)    Difficulty of Paying Living Expenses: Not hard at all  Food Insecurity: No Food Insecurity (05/30/2022)   Hunger Vital Sign    Worried About Running Out of Food  in the Last Year: Never true    [place] in the Last Year: Never true  Transportation Needs: No Transportation Needs (05/30/2022)   PRAPARE - Hydrologist (Medical): No    Lack of Transportation (Non-Medical): No  Physical Activity: Inactive (05/30/2022)   Exercise Vital Sign    Days of Exercise per Week: 0 days    Minutes of Exercise per Session: 0 min  Stress: No Stress Concern Present (05/30/2022)   [place]    Feeling of Stress : Not at all  Social Connections: Unknown (05/30/2022)   Social Connection and Isolation Panel [NHANES]    Frequency of Communication with Friends and Family: More than three times a week    Frequency of Social Gatherings with Friends and Family: Twice a week    Attends Religious Services: Never    Corporate treasurer or Organizations: Yes    Attends Music therapist: More than 4 times per year    Marital Status: Patient refused     Family History: The patient's ***family history includes COPD in her brother; Cancer in her sister; Heart disease in her sister.  ROS:   Please see the history of present illness.    *** All other systems reviewed and are negative.  EKGs/Labs/Other Studies Reviewed:    The following studies were reviewed today: Echo 01/06/22: IMPRESSIONS     1. Left ventricular ejection fraction, by estimation, is 65 to 70%. The  left ventricle has normal function. The left ventricle has no regional  wall motion abnormalities. There is moderate asymmetric left ventricular  hypertrophy of the septal segment.  Left ventricular diastolic parameters are consistent with Grade II  diastolic dysfunction (pseudonormalization). Elevated left atrial  pressure.   2. Right ventricular systolic function is normal. The right ventricular  size is mildly enlarged. Tricuspid regurgitation signal is inadequate for  assessing PA pressure.   3. Left atrial size was severely dilated.   4. The vaena cava still collapses >50% with inspiration and there is no  diastolic chamber collapse. Moderate pericardial effusion. There is no  evidence of cardiac tamponade.   5. The mitral valve is normal in structure. No evidence of mitral valve  regurgitation.   6. The aortic valve is tricuspid. Aortic valve regurgitation is not  visualized. No aortic stenosis is present.   7. The inferior vena cava is dilated in size with >50% respiratory  variability, suggesting right atrial pressure of 8 mmHg.   Comparison(s): The left ventricular function is unchanged. The left  ventricular diastolic function is significantly worse. There is evidence  of increased right and left heart filling pressures. Ther pericardial  effusion is larger, and there are some  early findings of tamponade.    Conclusion(s)/Recommendation(s): Pericardiocentesis does not appear  indicated at this time, but it may be indicated to repeat limited echo for  evaluation of the pericardial effusion.    EKG:  EKG is *** ordered today.  The ekg ordered today demonstrates ***  Recent Labs: 01/05/2022: B Natriuretic Peptide 243.4 01/12/2022: Magnesium 1.7 07/04/2022: ALT 10 08/24/2022: BUN 21; Creatinine, Ser 1.34; Hemoglobin 10.3; Platelets 173.0; Potassium 4.2; Pro B Natriuretic peptide (BNP) 298.0; Sodium 139; TSH 1.92  Recent Lipid Panel    Component Value Date/Time   CHOL 230 (H) 12/25/2021 1046   TRIG 348 (H) 12/25/2021 1046   HDL 30 (L) 12/25/2021 1046   CHOLHDL 7.7 (  H) 12/25/2021 1046   VLDL 40.0 01/05/2020 1107   LDLCALC 146 (H) 12/25/2021 1046   LDLDIRECT 96.0 09/30/2018 1259     Risk Assessment/Calculations:   {Does this patient have ATRIAL FIBRILLATION?:[id number]}  No BP recorded.  {Refresh Note OR Click here to enter BP  :1}***         Physical Exam:    VS:  There were no vitals taken for this visit.    Wt Readings from Last 3 Encounters:  09/12/22 167 lb (75.8 kg)  08/01/22 178 lb (80.7 kg)  07/06/22 173 lb (78.5 kg)     GEN: *** Well nourished, well developed in no acute distress HEENT: Normal NECK: No JVD; No carotid bruits LYMPHATICS: No lymphadenopathy CARDIAC: ***RRR, no murmurs, rubs, gallops RESPIRATORY:  Clear to auscultation without rales, wheezing or rhonchi  ABDOMEN: Soft, non-tender, non-distended MUSCULOSKELETAL:  No edema; No deformity  SKIN: Warm and dry NEUROLOGIC:  Alert and oriented x 3 PSYCHIATRIC:  Normal affect   ASSESSMENT:    No diagnosis found. PLAN:    In order of problems listed above:  ***      {Are you ordering a CV Procedure (e.g. stress test, cath, DCCV, TEE, etc)?   Press F2        :[id number]}    Medication Adjustments/Labs and Tests Ordered: Current medicines are reviewed at length with the patient today.  Concerns  regarding medicines are outlined above.  No orders of the defined types were placed in this encounter.  No orders of the defined types were placed in this encounter.   There are no Patient Instructions on file for this visit.   Signed, Rickayla Wieland [place], MD  09/19/2022 1:15 PM    [place]

## 2022-09-21 ENCOUNTER — Telehealth: Payer: Self-pay | Admitting: Family Medicine

## 2022-09-21 NOTE — Telephone Encounter (Signed)
Caregiver for pt called stated pt is lacking of sleep and constant worrying would like to be seen today if possible.

## 2022-09-22 ENCOUNTER — Other Ambulatory Visit: Payer: Self-pay | Admitting: Family Medicine

## 2022-09-22 ENCOUNTER — Ambulatory Visit
Admission: EM | Admit: 2022-09-22 | Discharge: 2022-09-22 | Disposition: A | Discharge status: Home / Self Care | Payer: PPO | Attending: Nurse Practitioner | Admitting: Nurse Practitioner

## 2022-09-22 DIAGNOSIS — R399 Unspecified symptoms and signs involving the genitourinary system: Secondary | ICD-10-CM | POA: Diagnosis not present

## 2022-09-22 DIAGNOSIS — R32 Unspecified urinary incontinence: Secondary | ICD-10-CM

## 2022-09-22 LAB — POCT URINALYSIS DIP (MANUAL ENTRY)
Bilirubin, UA: NEGATIVE
Blood, UA: NEGATIVE
Glucose, UA: >=1000 mg/dL — AB
Nitrite, UA: NEGATIVE
Protein Ur, POC: 100 mg/dL — AB
Spec Grav, UA: 1.025 (ref 1.010–1.025)
Urobilinogen, UA: 1 E.U./dL
pH, UA: 7 (ref 5.0–8.0)

## 2022-09-22 MED ORDER — CEPHALEXIN 500 MG PO CAPS: 500.0000 mg | ORAL_CAPSULE | Freq: Two times a day (BID) | ORAL | 0 refills | Status: DC

## 2022-09-22 NOTE — Discharge Instructions (Addendum)
The urinalysis is suggestive of a urinary tract infection.  A urine culture has been ordered for confirmation.  If the urine culture result is negative, you will be contacted and asked to stop the antibiotic. May administer Tylenol as needed for pain, fever, or general discomfort. Recommend encouraging the patient to stay awake during the day to help promote sleep at nighttime. If she becomes unable to bowels, more fatigued, or other concerns, please follow-up in the emergency department for further evaluation. Please follow-up with her primary care physician next week for reevaluation. Follow-up as needed.

## 2022-09-22 NOTE — ED Triage Notes (Signed)
Per caregivers, she has been fatigue and nauseas  x 1 week ago. Per caregiver pt is very stressed and they need to know that she is well enough to no go to a nursing home.

## 2022-09-22 NOTE — ED Provider Notes (Signed)
[facility]    CSN: [id number] Arrival date & time: 09/22/22  1237      History   Chief Complaint No chief complaint on file.   HPI Renee [name] is a 89 y.o. female.   The history is provided by a caregiver.   Patient brought in by her caregiver for complaints of altered sleep patterns and dysuria.  Patient's caregiver states over the last several weeks, patient has had difficulty sleeping due to excessive worrying.  She states the patient is sleeping more during the daytime and has difficulty going to sleep at night.  Patient's caregiver states patient just informed her while waiting at this appointment that she was also having burning with urination.  Patient and caregiver deny fever, chills, abdominal pain, chest pain, nausea, vomiting, diarrhea.  Caregiver states patient is eating less, but eating, and is having normal bowel movements at this time.  Patient with history of diabetes, stroke, and high blood pressure.  Past Medical History:  Diagnosis Date   Angioedema    possibly from voltaren   Bronchopneumonia 12/11/2016   Degenerative disc disease    Diabetes mellitus    type II   Hyperlipidemia    Hypertension    LVH (left ventricular hypertrophy)    and atrial enlargement by echo in past with nl EF   Nasal pruritis    Osteoarthritis    Osteopenia    Renal insufficiency    Sleep apnea    Stroke ([place]) 05/2010   Small vessel sobcortical (in [facility] trial) with Dr Leonie Man, residual L hemiparesis   Vitamin B 12 deficiency 04/08    Patient Active Problem List   Diagnosis Date Noted   Encounter for competency evaluation 09/14/2022   Fatigue 07/04/2022   History of UTI 02/28/2022   Physical deconditioning 01/19/2022   Chronic respiratory failure with hypoxia ([place]) 01/19/2022   Asthma, chronic, unspecified asthma severity, with acute exacerbation 01/06/2022   Allergic rhinitis 01/06/2022   Chronic diastolic CHF (congestive heart failure) ([place]) 01/06/2022    Obesity (BMI 30-39.9) 01/06/2022   Acute respiratory failure with hypoxia ([place]) 01/05/2022   Bradycardia 09/01/2021   UTI (urinary tract infection) 08/29/2021   Wheezing on expiration 08/29/2021   Sepsis due to pneumonia ([place]) 06/07/2020   Pleural effusion 05/13/2020   Frequent UTI 05/13/2020   Dizziness 02/12/2020   Normocytic anemia 02/12/2020   Fall involving sidewalk curb 11/29/2019   Contusion of back 11/27/2019   Aortic atherosclerosis ([place]) 07/13/2019   CKD (chronic kidney disease) stage 4, GFR 15-29 ml/min (HCC) 07/01/2019   External hemorrhoid 01/17/2018   History of colitis 01/17/2018   Generalized weakness 12/24/2016   Diabetic retinopathy ([place]) 12/11/2016   History of CVA (cerebrovascular accident) 12/11/2016   Weakness secondary to UTI 10/21/2015   Acute renal failure superimposed on stage 3b chronic kidney disease ([place]) 10/12/2015   CAP (community acquired pneumonia) 10/07/2015   Estrogen deficiency 08/30/2015   Mobility impaired 06/26/2011   History of retinal detachment 01/10/2011   Sleep apnea 11/28/2010   Anxiety and depression 08/25/2010   Hemiplegia, late effect of cerebrovascular disease ([place]) 07/05/2010   POSTHERPETIC NEURALGIA 11/09/2009   Renal insufficiency 06/29/2008   Chronic back pain 01/26/2008   EDEMA 01/26/2008   B12 deficiency 01/10/2007   Type 2 diabetes, controlled, with retinopathy ([place]) 11/27/2006   Essential hypertension 11/27/2006   FIBROCYSTIC BREAST DISEASE 11/27/2006   ROSACEA 11/27/2006   OSTEOARTHRITIS 11/27/2006   Urinary incontinence 11/27/2006    Past Surgical History:  Procedure Laterality Date   ABDOMINAL HYSTERECTOMY     BSO-fibroids   APPENDECTOMY     BACK SURGERY     COLON SURGERY     due to punctured intestines   EYE SURGERY     cataract extraction   IR THORACENTESIS ASP PLEURAL SPACE W/IMG GUIDE  06/07/2020   KNEE SURGERY     arthroscope   PARS PLANA VITRECTOMY  07/31/2011   Procedure: PARS PLANA VITRECTOMY  WITH 25 GAUGE;  Surgeon: [name], MD;  Location: [facility];  Service: Ophthalmology;  Laterality: Right;  REMOVAL OF SILICONE OIL AND LASER RIGHT EYE   RETINAL DETACHMENT SURGERY  02/18/11   times 2   SPINE SURGERY  08/09   spinal decompression surgery    OB History   No obstetric history on file.      Home Medications    Prior to Admission medications   Medication Sig Start Date End Date Taking? Authorizing Provider  cephALEXin (KEFLEX) 500 MG capsule Take 1 capsule (500 mg total) by mouth 2 (two) times daily for 7 days. 09/22/22 09/29/22 Yes Esmae Donathan-Warren, Alda Lea, NP  acetaminophen (TYLENOL) 500 MG tablet Take 1,000 mg by mouth at bedtime. Patient not taking: Reported on 09/12/2022    [provider]  albuterol (PROVENTIL) (2.5 MG/3ML) 0.083% nebulizer solution Take 3 mLs (2.5 mg total) by nebulization every 6 (six) hours as needed for wheezing or shortness of breath. 01/19/22 01/19/23  Parrett, Fonnie Mu, NP  albuterol (VENTOLIN HFA) 108 (90 Base) MCG/ACT inhaler INHALE 2 PUFFS INTO THE LUNGS IN THE MORNING, AT NOON, AND AT BEDTIME. 08/24/22   Chesley Mires, MD  amLODipine (NORVASC) 10 MG tablet TAKE 1 TABLET BY MOUTH EVERY DAY Patient taking differently: Take 10 mg by mouth daily. 11/14/21   [name], MD  aspirin EC 325 MG tablet Take 325 mg by mouth in the morning.    [provider]  budesonide-formoterol (SYMBICORT) 160-4.5 MCG/ACT inhaler Inhale 2 puffs into the lungs 2 (two) times daily. 08/03/22   Chesley Mires, MD  calcium-vitamin D (OSCAL WITH D) 500-200 MG-UNIT per tablet Take 1 tablet by mouth daily.    [provider]  Cholecalciferol (VITAMIN D-3) 25 MCG (1000 UT) CAPS Take 1,000 Units by mouth in the morning.    [provider]  Cranberry 500 MG TABS Take 500 mg by mouth in the morning and at bedtime.    [provider]  docusate sodium (COLACE) 100 MG capsule Take 200 mg by mouth at bedtime.    [provider]   estradiol (ESTRACE) 0.1 MG/GM vaginal cream Place 1 g vaginally daily. 04/25/21   [provider]  FLONASE 50 MCG/ACT nasal spray Place 2 sprays into both nostrils See admin instructions. Instill 2 sprays into each nostril at bedtime    [provider]  furosemide (LASIX) 20 MG tablet Take 0.5 tablets (10 mg total) by mouth daily. 08/22/22   Henson, Vickie L, NP-C  glipiZIDE (GLUCOTROL XL) 2.5 MG 24 hr tablet Take 1 tablet (2.5 mg total) by mouth daily with breakfast. 08/22/22   Henson, Vickie L, NP-C  glucose blood (ONETOUCH ULTRA) test strip CHECK BLOOD SUGAR ONCE DAILY AND AS DIRECTED. 10/18/21   [name], MD  hydrALAZINE (APRESOLINE) 25 MG tablet Take 1 tablet (25 mg total) by mouth every 8 (eight) hours. 06/20/22   Henson, Vickie L, NP-C  Lancets (ONETOUCH DELICA PLUS [id number]) MISC CHECK BLOOD SUGAR ONCE DAILY AND AS DIRECTED  08/01/21   [name], MD  levofloxacin (LEVAQUIN) 250 MG tablet Take 1 tablet (250 mg total) by mouth daily. 08/29/22   Henson, Vickie L, NP-C  metoprolol succinate (TOPROL-XL) 25 MG 24 hr tablet Take 12.5 mg by mouth daily. 09/16/21   [provider]  mirtazapine (REMERON) 15 MG tablet TAKE 1 TABLET BY MOUTH EVERYDAY AT BEDTIME 03/23/22   Henson, Vickie L, NP-C  Multiple Vitamin (MULTIVITAMIN) capsule Take 1 capsule by mouth daily.    [provider]  Multiple Vitamins-Minerals (PRESERVISION AREDS 2+MULTI VIT) CAPS Take 1 capsule by mouth in the morning and at bedtime.    [provider]  Olopatadine HCl (PATADAY OP) Place 1 drop into both eyes 2 (two) times daily as needed (for itching or redness).    [provider]  pantoprazole (PROTONIX) 40 MG tablet Take 1 tablet (40 mg total) by mouth daily at 6 (six) AM. 01/17/22   Eugenie Filler, MD  pioglitazone (ACTOS) 30 MG tablet Take 1 tablet (30 mg total) by mouth daily. 09/21/21   [name], MD  sertraline (ZOLOFT) 100 MG tablet Take 1 tablet (100 mg  total) by mouth daily. 09/12/22   Henson, Vickie L, NP-C  sitaGLIPtin (JANUVIA) 100 MG tablet Take 1 tablet (100 mg total) by mouth daily. 06/20/22   Henson, Vickie L, NP-C  solifenacin (VESICARE) 10 MG tablet TAKE 1 TABLET BY MOUTH EVERY DAY 05/15/22   Harland Dingwall L, NP-C  Spacer/Aero-Holding Chambers DEVI 1 each by Does not apply route at bedtime. Use with inhaler. 02/12/22   Chesley Mires, MD  trandolapril (MAVIK) 4 MG tablet TAKE 1 TABLET DAILY Patient taking differently: Take 4 mg by mouth daily. 10/30/21   [name], MD  vitamin B-12 (CYANOCOBALAMIN) 1000 MCG tablet Take 1,000 mcg by mouth daily.     [provider]    Family History Family History  Problem Relation Age of Onset   COPD Brother    Cancer Sister        brain tumor with hemmorhage   Heart disease Sister        CAD    Social History Social History   Tobacco Use   Smoking status: Never    Passive exposure: Never   Smokeless tobacco: Never  Substance Use Topics   Alcohol use: No    Alcohol/week: 0.0 standard drinks of alcohol   Drug use: No     Allergies   Tape and Voltaren [diclofenac]   Review of Systems Review of Systems Per HPI  Physical Exam Triage Vital Signs ED Triage Vitals  Enc Vitals Group     BP 09/22/22 1257 (!) 153/75     Pulse Rate 09/22/22 1257 69     Resp 09/22/22 1257 20     Temp 09/22/22 1257 97.7 F (36.5 C)     Temp Source 09/22/22 1255 Oral     SpO2 09/22/22 1257 93 %     Weight --      Height --      Head Circumference --      Peak Flow --      Pain Score 09/22/22 1300 0     Pain Loc --      Pain Edu? --      Excl. in [place]? --    No data found.  Updated Vital Signs BP (!) 153/75   Pulse 69   Temp 97.7 F (36.5 C) (Oral)   Resp 20  SpO2 93%   Visual Acuity Right Eye Distance:   Left Eye Distance:   Bilateral Distance:    Right Eye Near:   Left Eye Near:    Bilateral Near:     Physical Exam Vitals and nursing note reviewed.   Constitutional:      General: She is not in acute distress.    Appearance: She is well-developed.  Eyes:     Extraocular Movements: Extraocular movements intact.     Pupils: Pupils are equal, round, and reactive to light.  Cardiovascular:     Rate and Rhythm: Normal rate and regular rhythm.     Pulses: Normal pulses.     Heart sounds: Normal heart sounds.  Pulmonary:     Effort: Pulmonary effort is normal.     Breath sounds: Normal breath sounds.  Abdominal:     General: Bowel sounds are normal. There is no distension.     Palpations: Abdomen is soft.     Tenderness: There is no abdominal tenderness. There is no right CVA tenderness, left CVA tenderness, guarding or rebound.  Genitourinary:    Vagina: Normal. No vaginal discharge.  Musculoskeletal:     Cervical back: Normal range of motion.  Lymphadenopathy:     Cervical: No cervical adenopathy.  Skin:    General: Skin is warm and dry.     Findings: No erythema or rash.  Neurological:     General: No focal deficit present.     Mental Status: She is alert and oriented to person, place, and time.     Cranial Nerves: No cranial nerve deficit.  Psychiatric:        Mood and Affect: Mood normal.        Behavior: Behavior normal.      UC Treatments / Results  Labs (all labs ordered are listed, but only abnormal results are displayed) Labs Reviewed  POCT URINALYSIS DIP (MANUAL ENTRY) - Abnormal; Notable for the following components:      Result Value   Clarity, UA cloudy (*)    Glucose, UA >=1,000 (*)    Ketones, POC UA trace (5) (*)    Protein Ur, POC =100 (*)    Leukocytes, UA Trace (*)    All other components within normal limits  URINE CULTURE    EKG   Radiology No results found.  Procedures Procedures (including critical care time)  Medications Ordered in UC Medications - No data to display  Initial Impression / Assessment and Plan / UC Course  I have reviewed the triage vital signs and the nursing  notes.  Pertinent labs & imaging results that were available during my care of the patient were reviewed by me and considered in my medical decision making (see chart for details).  The patient is well-appearing, she is in no acute distress, vital signs are stable.  Urinalysis suspicious for a urinary tract infection.  Urine culture is pending.  Will treat patient empirically with Keflex 500 mg while culture is pending.  Patient advised there may be dose adjustment or discontinuation if the culture result is negative.  Supportive care recommendations were provided to the patient's caregivers along with strict indications of when to follow-up in the emergency department.  Caregivers were encouraged to have patient follow-up with her PCP within the next week for reevaluation.  Patient and caregivers verbalized understanding.  All questions were answered.  Patient stable for discharge.  Final Clinical Impressions(s) / UC Diagnoses   Final diagnoses:  Urinary tract infection  symptoms     Discharge Instructions      The urinalysis is suggestive of a urinary tract infection.  A urine culture has been ordered for confirmation.  If the urine culture result is negative, you will be contacted and asked to stop the antibiotic. May administer Tylenol as needed for pain, fever, or general discomfort. Recommend encouraging the patient to stay awake during the day to help promote sleep at nighttime. If she becomes unable to bowels, more fatigued, or other concerns, please follow-up in the emergency department for further evaluation. Please follow-up with her primary care physician next week for reevaluation. Follow-up as needed.     ED Prescriptions     Medication Sig Dispense Auth. Provider   cephALEXin (KEFLEX) 500 MG capsule Take 1 capsule (500 mg total) by mouth 2 (two) times daily for 7 days. 14 capsule Jerrico Covello-Warren, Alda Lea, NP      PDMP not reviewed this encounter.   Tish Men, NP 09/22/22 1404

## 2022-09-24 LAB — URINE CULTURE: Culture: >=100000 — AB

## 2022-09-24 NOTE — Telephone Encounter (Signed)
The 2 rx has been sent

## 2022-09-24 NOTE — Telephone Encounter (Signed)
You discontinued HCTZ (completely) and Glipizide '5mg'$  (dose change to 2.5 mg), but do you want to continue filling Vesicare and Hydralazine?

## 2022-09-24 NOTE — Telephone Encounter (Signed)
Called caregiver and she states she took pt to Urgent Care over the weekend as pt has another UTI and they gave her keflex but pt is still unable to sleep throughout the night. Renee [name] states pt will wake up at 2 in the morning and will have troubles going back to sleep and even staying asleep due to all the things that have been going on recently. Urgent care advised caregiver it is ok to go up on her melatonin medication but at this time caregiver reports she has not done that yet. Scheduled an appt just in case things do not get better.

## 2022-09-25 ENCOUNTER — Telehealth: Payer: Self-pay | Admitting: Family Medicine

## 2022-09-25 DIAGNOSIS — F32A Depression, unspecified: Secondary | ICD-10-CM | POA: Diagnosis not present

## 2022-09-25 DIAGNOSIS — Z87440 Personal history of urinary (tract) infections: Secondary | ICD-10-CM | POA: Diagnosis not present

## 2022-09-25 DIAGNOSIS — I69354 Hemiplegia and hemiparesis following cerebral infarction affecting left non-dominant side: Secondary | ICD-10-CM | POA: Diagnosis not present

## 2022-09-25 DIAGNOSIS — Z9181 History of falling: Secondary | ICD-10-CM | POA: Diagnosis not present

## 2022-09-25 DIAGNOSIS — M5451 Vertebrogenic low back pain: Secondary | ICD-10-CM | POA: Diagnosis not present

## 2022-09-25 DIAGNOSIS — F419 Anxiety disorder, unspecified: Secondary | ICD-10-CM | POA: Diagnosis not present

## 2022-09-25 DIAGNOSIS — N184 Chronic kidney disease, stage 4 (severe): Secondary | ICD-10-CM | POA: Diagnosis not present

## 2022-09-25 DIAGNOSIS — Z7982 Long term (current) use of aspirin: Secondary | ICD-10-CM | POA: Diagnosis not present

## 2022-09-25 DIAGNOSIS — G4730 Sleep apnea, unspecified: Secondary | ICD-10-CM | POA: Diagnosis not present

## 2022-09-25 DIAGNOSIS — Z7984 Long term (current) use of oral hypoglycemic drugs: Secondary | ICD-10-CM | POA: Diagnosis not present

## 2022-09-25 DIAGNOSIS — E1122 Type 2 diabetes mellitus with diabetic chronic kidney disease: Secondary | ICD-10-CM | POA: Diagnosis not present

## 2022-09-25 DIAGNOSIS — I129 Hypertensive chronic kidney disease with stage 1 through stage 4 chronic kidney disease, or unspecified chronic kidney disease: Secondary | ICD-10-CM | POA: Diagnosis not present

## 2022-09-25 NOTE — Telephone Encounter (Signed)
Called caregiver and she will be in tomorrow for pt appointment and will sign forms then. Will need [facility] for all OV information dating back to July 2023.  [name] reports a refill of hydralazine was sent in but states they thought they stopped this due to the drowsiness? [name] wants to make sure if pt is supposed to be taking this, please advise.

## 2022-09-25 NOTE — Telephone Encounter (Signed)
Patient called wanting to pick up a copy of her medical records for her attorney. I informed patient that she would need to come in and fill out a medical release form. Patient then just requested a callback from [place]. Best callback number for patient [phone].

## 2022-09-26 ENCOUNTER — Emergency Department (HOSPITAL_COMMUNITY): Payer: PPO

## 2022-09-26 ENCOUNTER — Inpatient Hospital Stay (HOSPITAL_COMMUNITY)
Admission: EM | Admit: 2022-09-26 | Discharge: 2022-10-08 | DRG: 689 | Disposition: A | Discharge status: Hospice | Payer: PPO | Attending: Internal Medicine | Admitting: Internal Medicine

## 2022-09-26 ENCOUNTER — Ambulatory Visit (INDEPENDENT_AMBULATORY_CARE_PROVIDER_SITE_OTHER): Payer: PPO | Admitting: Family Medicine

## 2022-09-26 ENCOUNTER — Other Ambulatory Visit: Payer: Self-pay

## 2022-09-26 VITALS — BP 136/62 | HR 60 | Temp 97.8°F | Ht 63.0 in

## 2022-09-26 DIAGNOSIS — J9601 Acute respiratory failure with hypoxia: Secondary | ICD-10-CM | POA: Diagnosis present

## 2022-09-26 DIAGNOSIS — Z8673 Personal history of transient ischemic attack (TIA), and cerebral infarction without residual deficits: Secondary | ICD-10-CM

## 2022-09-26 DIAGNOSIS — J45901 Unspecified asthma with (acute) exacerbation: Secondary | ICD-10-CM | POA: Diagnosis present

## 2022-09-26 DIAGNOSIS — G9341 Metabolic encephalopathy: Secondary | ICD-10-CM

## 2022-09-26 DIAGNOSIS — Z1152 Encounter for screening for COVID-19: Secondary | ICD-10-CM

## 2022-09-26 DIAGNOSIS — Z66 Do not resuscitate: Secondary | ICD-10-CM | POA: Diagnosis present

## 2022-09-26 DIAGNOSIS — J9611 Chronic respiratory failure with hypoxia: Secondary | ICD-10-CM | POA: Diagnosis present

## 2022-09-26 DIAGNOSIS — N1832 Chronic kidney disease, stage 3b: Secondary | ICD-10-CM | POA: Diagnosis not present

## 2022-09-26 DIAGNOSIS — G4710 Hypersomnia, unspecified: Secondary | ICD-10-CM

## 2022-09-26 DIAGNOSIS — R531 Weakness: Secondary | ICD-10-CM | POA: Diagnosis not present

## 2022-09-26 DIAGNOSIS — K219 Gastro-esophageal reflux disease without esophagitis: Secondary | ICD-10-CM | POA: Diagnosis present

## 2022-09-26 DIAGNOSIS — I700 Atherosclerosis of aorta: Secondary | ICD-10-CM | POA: Diagnosis not present

## 2022-09-26 DIAGNOSIS — B952 Enterococcus as the cause of diseases classified elsewhere: Secondary | ICD-10-CM | POA: Diagnosis present

## 2022-09-26 DIAGNOSIS — F419 Anxiety disorder, unspecified: Secondary | ICD-10-CM | POA: Diagnosis not present

## 2022-09-26 DIAGNOSIS — E11649 Type 2 diabetes mellitus with hypoglycemia without coma: Secondary | ICD-10-CM | POA: Diagnosis not present

## 2022-09-26 DIAGNOSIS — G319 Degenerative disease of nervous system, unspecified: Secondary | ICD-10-CM | POA: Diagnosis not present

## 2022-09-26 DIAGNOSIS — R5383 Other fatigue: Secondary | ICD-10-CM

## 2022-09-26 DIAGNOSIS — I69328 Other speech and language deficits following cerebral infarction: Secondary | ICD-10-CM

## 2022-09-26 DIAGNOSIS — D649 Anemia, unspecified: Secondary | ICD-10-CM | POA: Diagnosis present

## 2022-09-26 DIAGNOSIS — E860 Dehydration: Secondary | ICD-10-CM | POA: Diagnosis present

## 2022-09-26 DIAGNOSIS — N3000 Acute cystitis without hematuria: Secondary | ICD-10-CM

## 2022-09-26 DIAGNOSIS — S2241XA Multiple fractures of ribs, right side, initial encounter for closed fracture: Secondary | ICD-10-CM | POA: Diagnosis not present

## 2022-09-26 DIAGNOSIS — Z6830 Body mass index (BMI) 30.0-30.9, adult: Secondary | ICD-10-CM

## 2022-09-26 DIAGNOSIS — E875 Hyperkalemia: Secondary | ICD-10-CM | POA: Diagnosis present

## 2022-09-26 DIAGNOSIS — R4182 Altered mental status, unspecified: Secondary | ICD-10-CM | POA: Diagnosis not present

## 2022-09-26 DIAGNOSIS — R400 Somnolence: Principal | ICD-10-CM

## 2022-09-26 DIAGNOSIS — J9621 Acute and chronic respiratory failure with hypoxia: Secondary | ICD-10-CM | POA: Diagnosis not present

## 2022-09-26 DIAGNOSIS — J90 Pleural effusion, not elsewhere classified: Secondary | ICD-10-CM | POA: Diagnosis not present

## 2022-09-26 DIAGNOSIS — E1122 Type 2 diabetes mellitus with diabetic chronic kidney disease: Secondary | ICD-10-CM | POA: Diagnosis present

## 2022-09-26 DIAGNOSIS — I69354 Hemiplegia and hemiparesis following cerebral infarction affecting left non-dominant side: Secondary | ICD-10-CM | POA: Diagnosis not present

## 2022-09-26 DIAGNOSIS — J45909 Unspecified asthma, uncomplicated: Secondary | ICD-10-CM | POA: Diagnosis present

## 2022-09-26 DIAGNOSIS — W19XXXA Unspecified fall, initial encounter: Secondary | ICD-10-CM

## 2022-09-26 DIAGNOSIS — N2889 Other specified disorders of kidney and ureter: Secondary | ICD-10-CM | POA: Diagnosis not present

## 2022-09-26 DIAGNOSIS — N184 Chronic kidney disease, stage 4 (severe): Secondary | ICD-10-CM | POA: Diagnosis present

## 2022-09-26 DIAGNOSIS — E785 Hyperlipidemia, unspecified: Secondary | ICD-10-CM | POA: Diagnosis present

## 2022-09-26 DIAGNOSIS — R0989 Other specified symptoms and signs involving the circulatory and respiratory systems: Secondary | ICD-10-CM

## 2022-09-26 DIAGNOSIS — J690 Pneumonitis due to inhalation of food and vomit: Secondary | ICD-10-CM | POA: Diagnosis not present

## 2022-09-26 DIAGNOSIS — R001 Bradycardia, unspecified: Secondary | ICD-10-CM | POA: Diagnosis not present

## 2022-09-26 DIAGNOSIS — S300XXA Contusion of lower back and pelvis, initial encounter: Secondary | ICD-10-CM | POA: Diagnosis not present

## 2022-09-26 DIAGNOSIS — Z825 Family history of asthma and other chronic lower respiratory diseases: Secondary | ICD-10-CM

## 2022-09-26 DIAGNOSIS — Z8249 Family history of ischemic heart disease and other diseases of the circulatory system: Secondary | ICD-10-CM

## 2022-09-26 DIAGNOSIS — I5033 Acute on chronic diastolic (congestive) heart failure: Secondary | ICD-10-CM | POA: Diagnosis not present

## 2022-09-26 DIAGNOSIS — Y929 Unspecified place or not applicable: Secondary | ICD-10-CM | POA: Diagnosis not present

## 2022-09-26 DIAGNOSIS — F32A Depression, unspecified: Secondary | ICD-10-CM | POA: Diagnosis not present

## 2022-09-26 DIAGNOSIS — E1165 Type 2 diabetes mellitus with hyperglycemia: Secondary | ICD-10-CM | POA: Diagnosis present

## 2022-09-26 DIAGNOSIS — Z91048 Other nonmedicinal substance allergy status: Secondary | ICD-10-CM

## 2022-09-26 DIAGNOSIS — Z833 Family history of diabetes mellitus: Secondary | ICD-10-CM

## 2022-09-26 DIAGNOSIS — K8689 Other specified diseases of pancreas: Secondary | ICD-10-CM | POA: Diagnosis not present

## 2022-09-26 DIAGNOSIS — Z888 Allergy status to other drugs, medicaments and biological substances status: Secondary | ICD-10-CM

## 2022-09-26 DIAGNOSIS — R1310 Dysphagia, unspecified: Secondary | ICD-10-CM | POA: Diagnosis not present

## 2022-09-26 DIAGNOSIS — I130 Hypertensive heart and chronic kidney disease with heart failure and stage 1 through stage 4 chronic kidney disease, or unspecified chronic kidney disease: Secondary | ICD-10-CM | POA: Diagnosis present

## 2022-09-26 DIAGNOSIS — R4781 Slurred speech: Secondary | ICD-10-CM | POA: Diagnosis not present

## 2022-09-26 DIAGNOSIS — E669 Obesity, unspecified: Secondary | ICD-10-CM | POA: Diagnosis present

## 2022-09-26 DIAGNOSIS — R5381 Other malaise: Secondary | ICD-10-CM | POA: Diagnosis present

## 2022-09-26 DIAGNOSIS — R062 Wheezing: Secondary | ICD-10-CM | POA: Diagnosis not present

## 2022-09-26 DIAGNOSIS — K5730 Diverticulosis of large intestine without perforation or abscess without bleeding: Secondary | ICD-10-CM | POA: Diagnosis not present

## 2022-09-26 DIAGNOSIS — I69391 Dysphagia following cerebral infarction: Secondary | ICD-10-CM

## 2022-09-26 DIAGNOSIS — R1314 Dysphagia, pharyngoesophageal phase: Secondary | ICD-10-CM | POA: Diagnosis present

## 2022-09-26 DIAGNOSIS — Z7982 Long term (current) use of aspirin: Secondary | ICD-10-CM

## 2022-09-26 DIAGNOSIS — J984 Other disorders of lung: Secondary | ICD-10-CM | POA: Diagnosis not present

## 2022-09-26 DIAGNOSIS — S7001XA Contusion of right hip, initial encounter: Secondary | ICD-10-CM | POA: Diagnosis present

## 2022-09-26 DIAGNOSIS — I10 Essential (primary) hypertension: Secondary | ICD-10-CM | POA: Diagnosis present

## 2022-09-26 DIAGNOSIS — A419 Sepsis, unspecified organism: Secondary | ICD-10-CM | POA: Diagnosis not present

## 2022-09-26 DIAGNOSIS — N390 Urinary tract infection, site not specified: Principal | ICD-10-CM | POA: Diagnosis present

## 2022-09-26 DIAGNOSIS — R29898 Other symptoms and signs involving the musculoskeletal system: Secondary | ICD-10-CM | POA: Diagnosis not present

## 2022-09-26 DIAGNOSIS — R739 Hyperglycemia, unspecified: Secondary | ICD-10-CM | POA: Diagnosis not present

## 2022-09-26 DIAGNOSIS — G4730 Sleep apnea, unspecified: Secondary | ICD-10-CM | POA: Diagnosis present

## 2022-09-26 DIAGNOSIS — Z7951 Long term (current) use of inhaled steroids: Secondary | ICD-10-CM

## 2022-09-26 DIAGNOSIS — R0603 Acute respiratory distress: Secondary | ICD-10-CM | POA: Diagnosis not present

## 2022-09-26 DIAGNOSIS — Z79899 Other long term (current) drug therapy: Secondary | ICD-10-CM

## 2022-09-26 DIAGNOSIS — I69959 Hemiplegia and hemiparesis following unspecified cerebrovascular disease affecting unspecified side: Secondary | ICD-10-CM

## 2022-09-26 DIAGNOSIS — R1312 Dysphagia, oropharyngeal phase: Secondary | ICD-10-CM | POA: Diagnosis present

## 2022-09-26 DIAGNOSIS — B964 Proteus (mirabilis) (morganii) as the cause of diseases classified elsewhere: Secondary | ICD-10-CM | POA: Diagnosis not present

## 2022-09-26 DIAGNOSIS — R059 Cough, unspecified: Secondary | ICD-10-CM | POA: Diagnosis not present

## 2022-09-26 DIAGNOSIS — I5032 Chronic diastolic (congestive) heart failure: Secondary | ICD-10-CM | POA: Diagnosis present

## 2022-09-26 LAB — COMPREHENSIVE METABOLIC PANEL
ALT: 25 U/L (ref 0–44)
AST: 26 U/L (ref 15–41)
Albumin: 3.6 g/dL (ref 3.5–5.0)
Alkaline Phosphatase: 324 U/L — ABNORMAL HIGH (ref 38–126)
Anion gap: 9 (ref 5–15)
BUN: 21 mg/dL (ref 8–23)
CO2: 29 mmol/L (ref 22–32)
Calcium: 8.5 mg/dL — ABNORMAL LOW (ref 8.9–10.3)
Chloride: 94 mmol/L — ABNORMAL LOW (ref 98–111)
Creatinine, Ser: 1.47 mg/dL — ABNORMAL HIGH (ref 0.44–1.00)
GFR, Estimated: 33 mL/min — ABNORMAL LOW (ref >60)
Glucose, Bld: 509 mg/dL (ref 70–99)
Potassium: 3.8 mmol/L (ref 3.5–5.1)
Sodium: 132 mmol/L — ABNORMAL LOW (ref 135–145)
Total Bilirubin: 0.8 mg/dL (ref 0.3–1.2)
Total Protein: 6.7 g/dL (ref 6.5–8.1)

## 2022-09-26 LAB — CBC WITH DIFFERENTIAL/PLATELET
Abs Immature Granulocytes: 0.09 10*3/uL — ABNORMAL HIGH (ref 0.00–0.07)
Basophils Absolute: 0 10*3/uL (ref 0.0–0.1)
Basophils Relative: 1 %
Eosinophils Absolute: 0.1 10*3/uL (ref 0.0–0.5)
Eosinophils Relative: 1 %
HCT: 33.1 % — ABNORMAL LOW (ref 36.0–46.0)
Hemoglobin: 9.9 g/dL — ABNORMAL LOW (ref 12.0–15.0)
Immature Granulocytes: 1 %
Lymphocytes Relative: 11 %
Lymphs Abs: 1 10*3/uL (ref 0.7–4.0)
MCH: 27.9 pg (ref 26.0–34.0)
MCHC: 29.9 g/dL — ABNORMAL LOW (ref 30.0–36.0)
MCV: 93.2 fL (ref 80.0–100.0)
Monocytes Absolute: 0.5 10*3/uL (ref 0.1–1.0)
Monocytes Relative: 6 %
Neutro Abs: 6.8 10*3/uL (ref 1.7–7.7)
Neutrophils Relative %: 80 %
Platelets: 207 10*3/uL (ref 150–400)
RBC: 3.55 MIL/uL — ABNORMAL LOW (ref 3.87–5.11)
RDW: 14.4 % (ref 11.5–15.5)
WBC: 8.5 10*3/uL (ref 4.0–10.5)
nRBC: 0 % (ref 0.0–0.2)

## 2022-09-26 LAB — AMMONIA: Ammonia: <10 umol/L (ref 9–35)

## 2022-09-26 LAB — URINALYSIS, ROUTINE W REFLEX MICROSCOPIC
Bacteria, UA: NONE SEEN
Bilirubin Urine: NEGATIVE
Glucose, UA: >=500 mg/dL — AB
Hgb urine dipstick: NEGATIVE
Ketones, ur: NEGATIVE mg/dL
Leukocytes,Ua: NEGATIVE
Nitrite: NEGATIVE
Protein, ur: 100 mg/dL — AB
Specific Gravity, Urine: 1.023 (ref 1.005–1.030)
pH: 5 (ref 5.0–8.0)

## 2022-09-26 LAB — CBG MONITORING, ED
Glucose-Capillary: 512 mg/dL (ref 70–99)
Glucose-Capillary: 519 mg/dL (ref 70–99)

## 2022-09-26 LAB — BLOOD GAS, VENOUS
Acid-Base Excess: 6.2 mmol/L — ABNORMAL HIGH (ref 0.0–2.0)
Bicarbonate: 32.7 mmol/L — ABNORMAL HIGH (ref 20.0–28.0)
O2 Saturation: 37.2 %
Patient temperature: 37
pCO2, Ven: 54 mmHg (ref 44–60)
pH, Ven: 7.39 (ref 7.25–7.43)
pO2, Ven: <31 mmHg — CL (ref 32–45)

## 2022-09-26 LAB — RESP PANEL BY RT-PCR (RSV, FLU A&B, COVID)  RVPGX2
Influenza A by PCR: NEGATIVE
Influenza B by PCR: NEGATIVE
Resp Syncytial Virus by PCR: NEGATIVE
SARS Coronavirus 2 by RT PCR: NEGATIVE

## 2022-09-26 LAB — TSH: TSH: 2.275 u[IU]/mL (ref 0.350–4.500)

## 2022-09-26 LAB — BETA-HYDROXYBUTYRIC ACID: Beta-Hydroxybutyric Acid: 0.4 mmol/L — ABNORMAL HIGH (ref 0.05–0.27)

## 2022-09-26 LAB — LACTIC ACID, PLASMA
Lactic Acid, Venous: 1 mmol/L (ref 0.5–1.9)
Lactic Acid, Venous: 1.1 mmol/L (ref 0.5–1.9)

## 2022-09-26 LAB — BRAIN NATRIURETIC PEPTIDE: B Natriuretic Peptide: 296.7 pg/mL — ABNORMAL HIGH (ref 0.0–100.0)

## 2022-09-26 LAB — LIPASE, BLOOD: Lipase: 24 U/L (ref 11–51)

## 2022-09-26 MED ORDER — ENOXAPARIN SODIUM 30 MG/0.3ML IJ SOSY
30.0000 mg | PREFILLED_SYRINGE | INTRAMUSCULAR | Status: DC
  Administered 2022-09-27 – 2022-09-28 (×2): 30 mg via SUBCUTANEOUS
  Filled 2022-09-26 (×2): qty 0.3

## 2022-09-26 MED ORDER — INSULIN ASPART 100 UNIT/ML IJ SOLN
0.0000 [IU] | Freq: Three times a day (TID) | INTRAMUSCULAR | Status: DC
  Filled 2022-09-26: qty 0.09

## 2022-09-26 MED ORDER — INSULIN GLARGINE-YFGN 100 UNIT/ML ~~LOC~~ SOLN
5.0000 [IU] | Freq: Two times a day (BID) | SUBCUTANEOUS | Status: DC
  Administered 2022-09-27: 5 [IU] via SUBCUTANEOUS
  Filled 2022-09-26 (×3): qty 0.05

## 2022-09-26 MED ORDER — INSULIN ASPART 100 UNIT/ML IJ SOLN
0.0000 [IU] | INTRAMUSCULAR | Status: DC
  Administered 2022-09-26: 10 [IU] via SUBCUTANEOUS
  Filled 2022-09-26: qty 0.15

## 2022-09-26 MED ORDER — IPRATROPIUM-ALBUTEROL 0.5-2.5 (3) MG/3ML IN SOLN
3.0000 mL | Freq: Once | RESPIRATORY_TRACT | Status: AC
  Administered 2022-09-26: 3 mL via RESPIRATORY_TRACT
  Filled 2022-09-26: qty 3

## 2022-09-26 MED ORDER — HYDRALAZINE HCL 20 MG/ML IJ SOLN
10.0000 mg | Freq: Once | INTRAMUSCULAR | Status: AC
  Administered 2022-09-26: 10 mg via INTRAVENOUS
  Filled 2022-09-26: qty 1

## 2022-09-26 MED ORDER — INSULIN ASPART 100 UNIT/ML IJ SOLN
15.0000 [IU] | Freq: Once | INTRAMUSCULAR | Status: AC
  Administered 2022-09-27: 10 [IU] via SUBCUTANEOUS
  Filled 2022-09-26: qty 0.15

## 2022-09-26 MED ORDER — SODIUM CHLORIDE 0.9 % IV BOLUS
500.0000 mL | Freq: Once | INTRAVENOUS | Status: AC
  Administered 2022-09-26: 500 mL via INTRAVENOUS

## 2022-09-26 MED ORDER — INSULIN ASPART 100 UNIT/ML IJ SOLN
0.0000 [IU] | Freq: Every day | INTRAMUSCULAR | Status: DC
  Administered 2022-09-26: 5 [IU] via SUBCUTANEOUS
  Filled 2022-09-26: qty 0.05

## 2022-09-26 MED ORDER — FUROSEMIDE 10 MG/ML IJ SOLN: 20.0000 mg | Freq: Every day | INTRAMUSCULAR | Status: DC

## 2022-09-26 NOTE — ED Notes (Signed)
Respiratory in to see patient.

## 2022-09-26 NOTE — Telephone Encounter (Signed)
Called and informed caregiver, she verbalized understanding

## 2022-09-26 NOTE — ED Notes (Signed)
Hospitalist at bedside 

## 2022-09-26 NOTE — ED Provider Notes (Signed)
Reevaluated personally at bedside.  Patient quite dyspneic in the room with profound respiratory secretions. Suspect that patient has CHF exacerbation given recurrent O2 requirement. Started on diuresis and arranged for admission for further care.  Started on SSI for close stabilization. Disposition:   Based on the above findings, I believe this patient is stable for admission.    Patient/family educated about specific findings on our evaluation and explained exact reasons for admission.  Patient/family educated about clinical situation and time was allowed to answer questions.   Admission team communicated with and agreed with need for admission. Patient admitted. Patient ready to move at this time.     Emergency Department Medication Summary:   Medications  insulin aspart (novoLOG) injection 0-9 Units (has no administration in time range)  insulin aspart (novoLOG) injection 0-5 Units (has no administration in time range)  furosemide (LASIX) injection 20 mg (has no administration in time range)  enoxaparin (LOVENOX) injection 30 mg (has no administration in time range)  hydrALAZINE (APRESOLINE) injection 10 mg (10 mg Intravenous Given 09/26/22 1653)  sodium chloride 0.9 % bolus 500 mL (0 mLs Intravenous Stopped 09/26/22 1911)  ipratropium-albuterol (DUONEB) 0.5-2.5 (3) MG/3ML nebulizer solution 3 mL (3 mLs Nebulization Given 09/26/22 1924)          Tretha Sciara, MD 09/26/22 2342

## 2022-09-26 NOTE — ED Triage Notes (Signed)
Patient brought in by EMS from PCP after PCP noticed slurred speech and left side weakness. Per PCP and patient family this is patient baseline due to previous stroke. EMS states patient has current UTI and is taking 2 ABX, AMS x2 weeks and HX of COPD, EMS gave albuterol on truck, asthma and DM2.  195/123 60 CGB 556

## 2022-09-26 NOTE — Progress Notes (Signed)
The patient is in the room accompanied by her medical power of attorney, Ms. Granville Lewis.  Per Ms. [name], she has proof of POA, a notarized copy that she is medical POA.  Per Ms. [name], the patient's CODE STATUS is full code.  To note, while in the ED room, the patient was given a sip of water by POA, and immediately started coughing.  Per POA, coughing after swallowing is "normal for the patient."  Reportedly was seen by ENT recently and "did fine."  Will make the patient n.p.o. for now due to risk for aspiration, until she passes a swallow evaluation.  Time: 15 minutes

## 2022-09-26 NOTE — ED Notes (Signed)
Patient went to assist Tech in repositioning patient; patient O2 had dropped to 89% RN placed patient on 2L O2 via [place], per POA at bedside patient normally wears O2 at night.

## 2022-09-26 NOTE — ED Notes (Signed)
Pt care taken, is restless, MD to get respiratory to come give treatments.

## 2022-09-26 NOTE — ED Provider Notes (Signed)
[place] EMERGENCY DEPARTMENT AT [facility] Provider Note   CSN: [id number] Arrival date & time: 09/26/22  1445     History  No chief complaint on file.   Renee [name] is a 89 y.o. female.  The history is provided by the patient, medical records and the EMS personnel (ems report to nursing). The history is limited by the condition of the patient. No language interpreter was used.  Altered Mental Status Presenting symptoms: confusion and partial responsiveness   Presenting symptoms comment:  Somnolence Severity:  Moderate Episode history:  Continuous Duration:  1 week Timing:  Constant Progression:  Waxing and waning Chronicity:  New Context: recent illness and recent infection   Associated symptoms: no abdominal pain, no fever, no headaches, no light-headedness, no nausea, no palpitations, no rash, no seizures, no vomiting and no weakness        Home Medications Prior to Admission medications   Medication Sig Start Date End Date Taking? Authorizing Provider  acetaminophen (TYLENOL) 500 MG tablet Take 1,000 mg by mouth at bedtime. Patient not taking: Reported on 09/12/2022    [provider]  albuterol (PROVENTIL) (2.5 MG/3ML) 0.083% nebulizer solution Take 3 mLs (2.5 mg total) by nebulization every 6 (six) hours as needed for wheezing or shortness of breath. 01/19/22 01/19/23  Parrett, Fonnie Mu, NP  albuterol (VENTOLIN HFA) 108 (90 Base) MCG/ACT inhaler INHALE 2 PUFFS INTO THE LUNGS IN THE MORNING, AT NOON, AND AT BEDTIME. 08/24/22   Chesley Mires, MD  amLODipine (NORVASC) 10 MG tablet TAKE 1 TABLET BY MOUTH EVERY DAY Patient taking differently: Take 10 mg by mouth daily. 11/14/21   [name], MD  aspirin EC 325 MG tablet Take 325 mg by mouth in the morning.    [provider]  budesonide-formoterol (SYMBICORT) 160-4.5 MCG/ACT inhaler Inhale 2 puffs into the lungs 2 (two) times daily. 08/03/22   Chesley Mires, MD  calcium-vitamin D (OSCAL WITH  D) 500-200 MG-UNIT per tablet Take 1 tablet by mouth daily.    [provider]  cephALEXin (KEFLEX) 500 MG capsule Take 1 capsule (500 mg total) by mouth 2 (two) times daily for 7 days. 09/22/22 09/29/22  Leath-Warren, Alda Lea, NP  Cholecalciferol (VITAMIN D-3) 25 MCG (1000 UT) CAPS Take 1,000 Units by mouth in the morning.    [provider]  Cranberry 500 MG TABS Take 500 mg by mouth in the morning and at bedtime.    [provider]  docusate sodium (COLACE) 100 MG capsule Take 200 mg by mouth at bedtime.    [provider]  estradiol (ESTRACE) 0.1 MG/GM vaginal cream Place 1 g vaginally daily. 04/25/21   [provider]  FLONASE 50 MCG/ACT nasal spray Place 2 sprays into both nostrils See admin instructions. Instill 2 sprays into each nostril at bedtime    [provider]  furosemide (LASIX) 20 MG tablet Take 0.5 tablets (10 mg total) by mouth daily. 08/22/22   Henson, Vickie L, NP-C  glipiZIDE (GLUCOTROL XL) 2.5 MG 24 hr tablet Take 1 tablet (2.5 mg total) by mouth daily with breakfast. 08/22/22   Henson, Vickie L, NP-C  glucose blood (ONETOUCH ULTRA) test strip CHECK BLOOD SUGAR ONCE DAILY AND AS DIRECTED. 10/18/21   [name], MD  hydrALAZINE (APRESOLINE) 25 MG tablet TAKE 1 TABLET BY MOUTH EVERY 8 HOURS. Patient not taking: Reported on 09/26/2022 09/24/22   Girtha Rm, NP-C  Lancets (ONETOUCH DELICA PLUS [id number]) MISC CHECK BLOOD  SUGAR ONCE DAILY AND AS DIRECTED 08/01/21   [name], MD  levofloxacin (LEVAQUIN) 250 MG tablet Take 1 tablet (250 mg total) by mouth daily. 08/29/22   Henson, Vickie L, NP-C  metoprolol succinate (TOPROL-XL) 25 MG 24 hr tablet Take 12.5 mg by mouth daily. 09/16/21   [provider]  mirtazapine (REMERON) 15 MG tablet TAKE 1 TABLET BY MOUTH EVERYDAY AT BEDTIME 03/23/22   Henson, Vickie L, NP-C  Multiple Vitamin (MULTIVITAMIN) capsule Take 1 capsule by mouth daily.    [provider]   Multiple Vitamins-Minerals (PRESERVISION AREDS 2+MULTI VIT) CAPS Take 1 capsule by mouth in the morning and at bedtime.    [provider]  Olopatadine HCl (PATADAY OP) Place 1 drop into both eyes 2 (two) times daily as needed (for itching or redness).    [provider]  omeprazole (PRILOSEC) 20 MG capsule Take by mouth. 09/14/22 03/13/23  [provider]  pantoprazole (PROTONIX) 40 MG tablet Take 1 tablet (40 mg total) by mouth daily at 6 (six) AM. Patient not taking: Reported on 09/26/2022 01/17/22   Eugenie Filler, MD  pioglitazone (ACTOS) 30 MG tablet Take 1 tablet (30 mg total) by mouth daily. 09/21/21   [name], MD  sertraline (ZOLOFT) 100 MG tablet Take 1 tablet (100 mg total) by mouth daily. 09/12/22   Henson, Vickie L, NP-C  sitaGLIPtin (JANUVIA) 100 MG tablet Take 1 tablet (100 mg total) by mouth daily. 06/20/22   Henson, Vickie L, NP-C  solifenacin (VESICARE) 10 MG tablet TAKE 1 TABLET BY MOUTH EVERY DAY 09/24/22   Harland Dingwall L, NP-C  Spacer/Aero-Holding Chambers DEVI 1 each by Does not apply route at bedtime. Use with inhaler. 02/12/22   Chesley Mires, MD  trandolapril (MAVIK) 4 MG tablet TAKE 1 TABLET DAILY Patient taking differently: Take 4 mg by mouth daily. 10/30/21   [name], MD  vitamin B-12 (CYANOCOBALAMIN) 1000 MCG tablet Take 1,000 mcg by mouth daily.     [provider]      Allergies    Tape and Voltaren [diclofenac]    Review of Systems   Review of Systems  Constitutional:  Positive for fatigue. Negative for chills and fever.  HENT:  Positive for congestion.   Eyes:  Negative for visual disturbance.  Respiratory:  Positive for cough. Negative for chest tightness, shortness of breath and wheezing.   Cardiovascular:  Negative for chest pain and palpitations.  Gastrointestinal:  Negative for abdominal pain, constipation, diarrhea, nausea and vomiting.  Genitourinary:  Positive for frequency. Negative for dysuria and  flank pain.  Musculoskeletal:  Negative for back pain and neck pain.  Skin:  Negative for rash and wound.  Neurological:  Negative for seizures, weakness, light-headedness, numbness and headaches.  Psychiatric/Behavioral:  Positive for confusion.   All other systems reviewed and are negative.   Physical Exam Updated Vital Signs BP (!) 193/69 (BP Location: Right Arm)   Pulse 64   Temp (!) 97.4 F (36.3 C) (Oral)   Resp 16   SpO2 95%  Physical Exam Vitals and nursing note reviewed.  Constitutional:      General: She is not in acute distress.    Appearance: She is well-developed. She is not diaphoretic.  HENT:     Head: Normocephalic and atraumatic.     Right Ear: External ear normal.     Left Ear: External ear normal.     Nose: Congestion present. No rhinorrhea.  Mouth/Throat:     Mouth: Mucous membranes are dry.     Pharynx: No oropharyngeal exudate or posterior oropharyngeal erythema.  Eyes:     Conjunctiva/sclera: Conjunctivae normal.     Pupils: Pupils are equal, round, and reactive to light.  Cardiovascular:     Rate and Rhythm: Normal rate.  Pulmonary:     Effort: No respiratory distress.     Breath sounds: No stridor. No wheezing, rhonchi or rales.  Chest:     Chest wall: No tenderness.  Abdominal:     General: There is no distension.     Tenderness: There is no abdominal tenderness. There is no right CVA tenderness, left CVA tenderness, guarding or rebound.  Musculoskeletal:        General: No tenderness.     Cervical back: Normal range of motion and neck supple. No tenderness.     Right lower leg: No edema.     Left lower leg: No edema.  Skin:    General: Skin is warm.     Findings: No erythema or rash.  Neurological:     Mental Status: She is alert. Mental status is at baseline.     Motor: Weakness present. No abnormal muscle tone.     Deep Tendon Reflexes: Reflexes are normal and symmetric.     ED Results / Procedures / Treatments   Labs (all  labs ordered are listed, but only abnormal results are displayed) Labs Reviewed  CBC WITH DIFFERENTIAL/PLATELET - Abnormal; Notable for the following components:      Result Value   RBC 3.55 (*)    Hemoglobin 9.9 (*)    HCT 33.1 (*)    MCHC 29.9 (*)    Abs Immature Granulocytes 0.09 (*)    All other components within normal limits  COMPREHENSIVE METABOLIC PANEL - Abnormal; Notable for the following components:   Sodium 132 (*)    Chloride 94 (*)    Glucose, Bld 509 (*)    Creatinine, Ser 1.47 (*)    Calcium 8.5 (*)    Alkaline Phosphatase 324 (*)    GFR, Estimated 33 (*)    All other components within normal limits  BLOOD GAS, VENOUS - Abnormal; Notable for the following components:   pO2, Ven <31 (*)    Bicarbonate 32.7 (*)    Acid-Base Excess 6.2 (*)    All other components within normal limits  BETA-HYDROXYBUTYRIC ACID - Abnormal; Notable for the following components:   Beta-Hydroxybutyric Acid 0.40 (*)    All other components within normal limits  CBG MONITORING, ED - Abnormal; Notable for the following components:   Glucose-Capillary 512 (*)    All other components within normal limits  URINE CULTURE  CULTURE, BLOOD (ROUTINE X 2)  CULTURE, BLOOD (ROUTINE X 2)  RESP PANEL BY RT-PCR (RSV, FLU A&B, COVID)  [id number]  LACTIC ACID, PLASMA  AMMONIA  LIPASE, BLOOD  LACTIC ACID, PLASMA  TSH  URINALYSIS, ROUTINE W REFLEX MICROSCOPIC    EKG EKG Interpretation  Date/Time:  Wednesday September 26 2022 14:55:10 EST Ventricular Rate:  65 PR Interval:  145 QRS Duration: 92 QT Interval:  446 QTC Calculation: 464 R Axis:   177 Text Interpretation: Sinus rhythm Probable RVH w/ secondary repol abnormality when compared to prior, overal similar appearance. No STEMI Confirmed by Antony Blackbird [phone]) on 09/26/2022 3:09:22 PM  Radiology DG Chest Portable 1 View  Result Date: 09/26/2022 CLINICAL DATA:  Cough, altered level of consciousness, left-sided weakness EXAM: PORTABLE CHEST 1  VIEW COMPARISON:  09/12/2022 FINDINGS: Single frontal view of the chest demonstrates a stable cardiac silhouette. Lung volumes are diminished, without airspace disease, effusion, or pneumothorax. No acute bony abnormality. IMPRESSION: 1. Low lung volumes.  No acute process. Electronically Signed   By: Randa Ngo M.D.   On: 09/26/2022 16:11    Procedures Procedures    Medications Ordered in ED Medications  hydrALAZINE (APRESOLINE) injection 10 mg (has no administration in time range)  sodium chloride 0.9 % bolus 500 mL (has no administration in time range)    ED Course/ Medical Decision Making/ A&P Clinical Course as of 09/26/22 1653  Wed Sep 26, 2022  1642 Stable 89 YO here with chief complaint of AMS. Fatigue and AMS. Sugar elevated. At neurologic baseline per family.  Urine pending. Hydral.  [CC]    Clinical Course User Index [CC] Tretha Sciara, MD                             Medical Decision Making Amount and/or Complexity of Data Reviewed Labs: ordered. Radiology: ordered.  Risk Prescription drug management.    Renee [name] is a 89 y.o. female with a past medical history significant for diabetes, CKD, CHF, previous stroke with chronic left-sided weakness, hypertension, hyperlipidemia, and currently on antibiotics for urinary tract infection who presents with family for somnolence, altered mental status, and hyperglycemia.  According to EMS report, patient has been on antibiotics for UTI and she is still on them.  She reportedly has had worsening fatigue and somnolence for the last 2 weeks but there was no reported trauma or falls.  She is complaining of some fatigue and cough and is reporting some urinary frequency.  She denies any chest pain, palpitations, shortness of breath at this time.  Denies any headache or neck pain.  She does feel that she is thinking more confused and slow and feels more tired.  She is denying any focal neurologic deficits and she feels that  her speech is normal and her weakness on her left side is at his baseline.  She reportedly went to PCP today and they were concerned about acute stroke but family reportedly told EMS that it was at her baseline from a neurologic standpoint.  On arrival, patient was found to have a fingerstick glucose of over 500.  Given her history I am concerned that patient may have worsened UTI leading to the somnolence and altered mental status versus DKA with hyperglycemia, versus other cause.  We will get a CT of the head given her history of previous stroke and the reported somnolence and altered mental status however we will also get screening labs, urine, chest x-ray, and other workup to rule out DKA.  Patient's workup began to return.  Ammonia not elevated, lipase normal.  Lactic acid normal.  Metabolic panel does show glucose over 500 but does not appear to have elevated anion  gap.  Creatinine is similar to prior at 1.47.  She is not acidotic at this time.  CBC shows no leukocytosis and anemia similar to prior.  Will still await the rest of the labs but we will start some fluids for the patient as she does appear slightly dehydrated with dry mucous membranes and the hyperglycemia.  Will get urinalysis and wait for CT head.  Given the altered mental status from baseline, I do anticipate she will need admission.  With lack of headache or neck pain have a meningitis  at this time.    Her blood pressure was noted to be in the 190s, this may contribute to her altered mental status.  Will consider giving her some blood pressure medicine.  Her heart rate is in the 60s so we will give her some hydralazine initially.  Will give her some fluids and hydralazine to help with the glucose and blood pressure and care be transferred to oncoming team to await results of urinalysis and reassessment and head CT.  Anticipate admission after workup is completed.  Care transferred to oncoming team to await workup results.   Anticipate admission due to altered mental status.        Final Clinical Impression(s) / ED Diagnoses Final diagnoses:  Somnolence   Clinical Impression: 1. Somnolence     Disposition: Care transferred to oncoming team to wait for results of workup.  Anticipate admission for altered mental status..  This note was prepared with assistance of Dragon voice recognition software. Occasional wrong-word or sound-a-like substitutions may have occurred due to the inherent limitations of voice recognition software.     Breda Bond, Gwenyth Allegra, MD 09/26/22 [phone]

## 2022-09-26 NOTE — Progress Notes (Signed)
Subjective:     Patient ID: Renee [name], female    DOB: July 15, 1929, 89 y.o.   MRN: [id number]  Chief Complaint  Patient presents with   not sleeping well    Wants to check mouth for thrush     HPI Patient is in today for a one week hx of weakness, lethargy and sleeping "all the time".  She is with her caregivers today.  Sitting in a wheelchair.   We increased her sertraline dose from 50 mg to 100 mg when she was last here 2 weeks ago due to concern regarding worsening depression. Caregiver states she has been giving her 50 mg in the morning and 50 mg in the evening. Medication prescribed once daily dosing.   Takes mirtazapine 15 mg at bedtime. She has been on this for years.   Hx of nocturnal hypoxemia per Dr. Halford Chessman. Uses oxygen at bedtime. Caregiver reports nasal cannula does not stay on well.   Seen in ED on 09/22/2022 for UTI and is taking Keflex.   She has an appt with cardiologist in 2 days and appt with pulmonologist on 10/12/2022    Health Maintenance Due  Topic Date Due   MAMMOGRAM  08/02/2021   DTaP/Tdap/Td (3 - Tdap) 04/04/2022    Past Medical History:  Diagnosis Date   Angioedema    possibly from voltaren   Bronchopneumonia 12/11/2016   Degenerative disc disease    Diabetes mellitus    type II   Hyperlipidemia    Hypertension    LVH (left ventricular hypertrophy)    and atrial enlargement by echo in past with nl EF   Nasal pruritis    Osteoarthritis    Osteopenia    Renal insufficiency    Sleep apnea    Stroke ([place]) 05/2010   Small vessel sobcortical (in [place] trial) with Dr Leonie Man, residual L hemiparesis   Vitamin B 12 deficiency 04/08    Past Surgical History:  Procedure Laterality Date   ABDOMINAL HYSTERECTOMY     BSO-fibroids   APPENDECTOMY     BACK SURGERY     COLON SURGERY     due to punctured intestines   EYE SURGERY     cataract extraction   IR THORACENTESIS ASP PLEURAL SPACE W/IMG GUIDE  06/07/2020   KNEE SURGERY      arthroscope   PARS PLANA VITRECTOMY  07/31/2011   Procedure: PARS PLANA VITRECTOMY WITH 25 GAUGE;  Surgeon: [name], MD;  Location: [facility];  Service: Ophthalmology;  Laterality: Right;  REMOVAL OF SILICONE OIL AND LASER RIGHT EYE   RETINAL DETACHMENT SURGERY  02/18/11   times 2   SPINE SURGERY  08/09   spinal decompression surgery    Family History  Problem Relation Age of Onset   COPD Brother    Cancer Sister        brain tumor with hemmorhage   Heart disease Sister        CAD    Social History   Socioeconomic History   Marital status: Widowed    Spouse name: Not on file   Number of children: Not on file   Years of education: Not on file   Highest education level: Not on file  Occupational History   Not on file  Tobacco Use   Smoking status: Never    Passive exposure: Never   Smokeless tobacco: Never  Vaping Use   Vaping Use: Not on file  Substance and Sexual Activity  Alcohol use: No    Alcohol/week: 0.0 standard drinks of alcohol   Drug use: No   Sexual activity: Never  Other Topics Concern   Not on file  Social History Narrative   Not on file   Social Determinants of Health   Financial Resource Strain: Low Risk  (05/30/2022)   Overall Financial Resource Strain (CARDIA)    Difficulty of Paying Living Expenses: Not hard at all  Food Insecurity: No Food Insecurity (05/30/2022)   Hunger Vital Sign    Worried About Running Out of Food in the Last Year: Never true    Ran Out of Food in the Last Year: Never true  Transportation Needs: No Transportation Needs (05/30/2022)   PRAPARE - Hydrologist (Medical): No    Lack of Transportation (Non-Medical): No  Physical Activity: Inactive (05/30/2022)   Exercise Vital Sign    Days of Exercise per Week: 0 days    Minutes of Exercise per Session: 0 min  Stress: No Stress Concern Present (05/30/2022)   [place]     Feeling of Stress : Not at all  Social Connections: Unknown (05/30/2022)   Social Connection and Isolation Panel [NHANES]    Frequency of Communication with Friends and Family: More than three times a week    Frequency of Social Gatherings with Friends and Family: Twice a week    Attends Religious Services: Never    Marine scientist or Organizations: Yes    Attends Music therapist: More than 4 times per year    Marital Status: Patient refused  Intimate Partner Violence: Not At Risk (05/30/2022)   Humiliation, Afraid, Rape, and Kick questionnaire    Fear of Current or Ex-Partner: No    Emotionally Abused: No    Physically Abused: No    Sexually Abused: No    Outpatient Medications Prior to Visit  Medication Sig Dispense Refill   albuterol (PROVENTIL) (2.5 MG/3ML) 0.083% nebulizer solution Take 3 mLs (2.5 mg total) by nebulization every 6 (six) hours as needed for wheezing or shortness of breath. 75 mL 5   albuterol (VENTOLIN HFA) 108 (90 Base) MCG/ACT inhaler INHALE 2 PUFFS INTO THE LUNGS IN THE MORNING, AT NOON, AND AT BEDTIME. 6.7 each 1   amLODipine (NORVASC) 10 MG tablet TAKE 1 TABLET BY MOUTH EVERY DAY (Patient taking differently: Take 10 mg by mouth daily.) 90 tablet 3   aspirin EC 325 MG tablet Take 325 mg by mouth in the morning.     budesonide-formoterol (SYMBICORT) 160-4.5 MCG/ACT inhaler Inhale 2 puffs into the lungs 2 (two) times daily. 10.2 g 11   calcium-vitamin D (OSCAL WITH D) 500-200 MG-UNIT per tablet Take 1 tablet by mouth daily.     cephALEXin (KEFLEX) 500 MG capsule Take 1 capsule (500 mg total) by mouth 2 (two) times daily for 7 days. 14 capsule 0   Cholecalciferol (VITAMIN D-3) 25 MCG (1000 UT) CAPS Take 1,000 Units by mouth in the morning.     Cranberry 500 MG TABS Take 500 mg by mouth in the morning and at bedtime.     docusate sodium (COLACE) 100 MG capsule Take 200 mg by mouth at bedtime.     estradiol (ESTRACE) 0.1 MG/GM vaginal cream Place  1 g vaginally daily.     FLONASE 50 MCG/ACT nasal spray Place 2 sprays into both nostrils See admin instructions. Instill 2 sprays into each nostril at bedtime  furosemide (LASIX) 20 MG tablet Take 0.5 tablets (10 mg total) by mouth daily. 45 tablet 0   glipiZIDE (GLUCOTROL XL) 2.5 MG 24 hr tablet Take 1 tablet (2.5 mg total) by mouth daily with breakfast. 90 tablet 0   glucose blood (ONETOUCH ULTRA) test strip CHECK BLOOD SUGAR ONCE DAILY AND AS DIRECTED. 100 strip 11   Lancets (ONETOUCH DELICA PLUS [id number]) MISC CHECK BLOOD SUGAR ONCE DAILY AND AS DIRECTED 100 each 2   levofloxacin (LEVAQUIN) 250 MG tablet Take 1 tablet (250 mg total) by mouth daily. 6 tablet 0   metoprolol succinate (TOPROL-XL) 25 MG 24 hr tablet Take 12.5 mg by mouth daily.     mirtazapine (REMERON) 15 MG tablet TAKE 1 TABLET BY MOUTH EVERYDAY AT BEDTIME 90 tablet 0   Multiple Vitamin (MULTIVITAMIN) capsule Take 1 capsule by mouth daily.     Multiple Vitamins-Minerals (PRESERVISION AREDS 2+MULTI VIT) CAPS Take 1 capsule by mouth in the morning and at bedtime.     Olopatadine HCl (PATADAY OP) Place 1 drop into both eyes 2 (two) times daily as needed (for itching or redness).     omeprazole (PRILOSEC) 20 MG capsule Take by mouth.     pioglitazone (ACTOS) 30 MG tablet Take 1 tablet (30 mg total) by mouth daily. 90 tablet 3   sertraline (ZOLOFT) 100 MG tablet Take 1 tablet (100 mg total) by mouth daily. 30 tablet 2   sitaGLIPtin (JANUVIA) 100 MG tablet Take 1 tablet (100 mg total) by mouth daily. 30 tablet 2   solifenacin (VESICARE) 10 MG tablet TAKE 1 TABLET BY MOUTH EVERY DAY 90 tablet 0   Spacer/Aero-Holding Chambers DEVI 1 each by Does not apply route at bedtime. Use with inhaler. 1 each 0   trandolapril (MAVIK) 4 MG tablet TAKE 1 TABLET DAILY (Patient taking differently: Take 4 mg by mouth daily.) 90 tablet 3   vitamin B-12 (CYANOCOBALAMIN) 1000 MCG tablet Take 1,000 mcg by mouth daily.      acetaminophen (TYLENOL) 500  MG tablet Take 1,000 mg by mouth at bedtime. (Patient not taking: Reported on 09/12/2022)     hydrALAZINE (APRESOLINE) 25 MG tablet TAKE 1 TABLET BY MOUTH EVERY 8 HOURS. (Patient not taking: Reported on 09/26/2022) 270 tablet 0   pantoprazole (PROTONIX) 40 MG tablet Take 1 tablet (40 mg total) by mouth daily at 6 (six) AM. (Patient not taking: Reported on 09/26/2022) 30 tablet 1   No facility-administered medications prior to visit.    Allergies  Allergen Reactions   Tape Other (See Comments)    The patient's skin is VERY THIN- will TEAR EASILY!!   Voltaren [Diclofenac] Swelling and Other (See Comments)    Angioedema    ROS     Objective:    Physical Exam Constitutional:      Appearance: She is well-developed.  HENT:     Mouth/Throat:     Mouth: Mucous membranes are dry.  Cardiovascular:     Rate and Rhythm: Normal rate and regular rhythm.  Pulmonary:     Effort: Pulmonary effort is normal.     Breath sounds: Wheezing and rhonchi present.  Skin:    General: Skin is warm and dry.     Findings: Bruising present.          Comments: Bruising noted to right flank  Neurological:     Mental Status: She is lethargic.     Comments: Patient is aroused but not easily. Sitting slumped over in wheelchair. Respirations unlabored. Unable  to stand with 2 person assist.   Psychiatric:        Speech: Speech is slurred.        Behavior: Behavior is slowed.     BP 136/62 (BP Location: Left Arm, Patient Position: Sitting, Cuff Size: Normal)   Pulse 60   Temp 97.8 F (36.6 C) (Temporal)   Ht '5\' 3"'$  (1.6 m)   SpO2 94%   BMI 29.58 kg/m  Wt Readings from Last 3 Encounters:  09/12/22 167 lb (75.8 kg)  08/01/22 178 lb (80.7 kg)  07/06/22 173 lb (78.5 kg)        Assessment & Plan:   Problem List Items Addressed This Visit       Genitourinary   UTI (urinary tract infection)     Other   Weakness secondary to UTI   Other Visit Diagnoses     Altered mental status, unspecified  altered mental status type    -  Primary   Lethargic       Hypersomnolence       Fall, initial encounter       Traumatic hematoma of lower back, initial encounter       Abnormal lung sounds       Slurred speech          Patient being sent to the ED via ambulance for lethargy, difficulty staying awake, slurred speech and weakness.  Recent traumatic fall injuring low back. Complex medical history including CHF, nocturnal hypoxemia, chronic respiratory failure, sepsis Recent diagnosis of UTI and on antibiotics.  History of CVA.  Discussed with caregivers that she needs a thorough workup to determine underlying etiology.   I am having [place] A. Colborn maintain her multivitamin, calcium-vitamin D, docusate sodium, cyanocobalamin, Olopatadine HCl (PATADAY OP), PreserVision AREDS 2+Multi Vit, Cranberry, acetaminophen, aspirin EC, Vitamin D-3, Flonase, estradiol, OneTouch Delica Plus [id number], pioglitazone, metoprolol succinate, OneTouch Ultra, trandolapril, amLODipine, pantoprazole, albuterol, Spacer/Aero-Holding Chambers, mirtazapine, sitaGLIPtin, budesonide-formoterol, glipiZIDE, furosemide, albuterol, levofloxacin, sertraline, cephALEXin, solifenacin, hydrALAZINE, and omeprazole.  No orders of the defined types were placed in this encounter.

## 2022-09-26 NOTE — ED Notes (Signed)
Patient agitated and pulling at IV, sheets and purwick. POA at bedside and helped RN try to calm and redirect patient. Patient redirected and resting quietly.

## 2022-09-27 DIAGNOSIS — R1310 Dysphagia, unspecified: Secondary | ICD-10-CM

## 2022-09-27 DIAGNOSIS — G9341 Metabolic encephalopathy: Secondary | ICD-10-CM | POA: Diagnosis not present

## 2022-09-27 DIAGNOSIS — I639 Cerebral infarction, unspecified: Secondary | ICD-10-CM | POA: Insufficient documentation

## 2022-09-27 LAB — RESPIRATORY PANEL BY PCR

## 2022-09-27 LAB — CBC
HCT: 30.6 % — ABNORMAL LOW (ref 36.0–46.0)
HCT: 31.4 % — ABNORMAL LOW (ref 36.0–46.0)
Hemoglobin: 9.2 g/dL — ABNORMAL LOW (ref 12.0–15.0)
Hemoglobin: 9.5 g/dL — ABNORMAL LOW (ref 12.0–15.0)
MCH: 27.9 pg (ref 26.0–34.0)
MCH: 28.1 pg (ref 26.0–34.0)
MCHC: 30.1 g/dL (ref 30.0–36.0)
MCHC: 30.3 g/dL (ref 30.0–36.0)
MCV: 92.7 fL (ref 80.0–100.0)
MCV: 92.9 fL (ref 80.0–100.0)
Platelets: 195 10*3/uL (ref 150–400)
Platelets: 236 10*3/uL (ref 150–400)
RBC: 3.3 MIL/uL — ABNORMAL LOW (ref 3.87–5.11)
RBC: 3.38 MIL/uL — ABNORMAL LOW (ref 3.87–5.11)
RDW: 14.6 % (ref 11.5–15.5)
RDW: 14.6 % (ref 11.5–15.5)
WBC: 15.5 10*3/uL — ABNORMAL HIGH (ref 4.0–10.5)
WBC: 8.8 10*3/uL (ref 4.0–10.5)
nRBC: 0 % (ref 0.0–0.2)
nRBC: 0 % (ref 0.0–0.2)

## 2022-09-27 LAB — BASIC METABOLIC PANEL
Anion gap: 12 (ref 5–15)
Anion gap: 13 (ref 5–15)
BUN: 22 mg/dL (ref 8–23)
BUN: 22 mg/dL (ref 8–23)
CO2: 26 mmol/L (ref 22–32)
CO2: 27 mmol/L (ref 22–32)
Calcium: 8.7 mg/dL — ABNORMAL LOW (ref 8.9–10.3)
Calcium: 8.5 mg/dL — ABNORMAL LOW (ref 8.9–10.3)
Chloride: 99 mmol/L (ref 98–111)
Chloride: 95 mmol/L — ABNORMAL LOW (ref 98–111)
Creatinine, Ser: 1.27 mg/dL — ABNORMAL HIGH (ref 0.44–1.00)
Creatinine, Ser: 1.27 mg/dL — ABNORMAL HIGH (ref 0.44–1.00)
GFR, Estimated: 40 mL/min — ABNORMAL LOW (ref >60)
GFR, Estimated: 40 mL/min — ABNORMAL LOW (ref >60)
Glucose, Bld: 95 mg/dL (ref 70–99)
Glucose, Bld: 223 mg/dL — ABNORMAL HIGH (ref 70–99)
Potassium: 3 mmol/L — ABNORMAL LOW (ref 3.5–5.1)
Potassium: 4.6 mmol/L (ref 3.5–5.1)
Sodium: 137 mmol/L (ref 135–145)
Sodium: 135 mmol/L (ref 135–145)

## 2022-09-27 LAB — GLUCOSE, CAPILLARY
Glucose-Capillary: 249 mg/dL — ABNORMAL HIGH (ref 70–99)
Glucose-Capillary: 154 mg/dL — ABNORMAL HIGH (ref 70–99)
Glucose-Capillary: 131 mg/dL — ABNORMAL HIGH (ref 70–99)
Glucose-Capillary: 48 mg/dL — ABNORMAL LOW (ref 70–99)
Glucose-Capillary: 102 mg/dL — ABNORMAL HIGH (ref 70–99)
Glucose-Capillary: 62 mg/dL — ABNORMAL LOW (ref 70–99)
Glucose-Capillary: 146 mg/dL — ABNORMAL HIGH (ref 70–99)
Glucose-Capillary: 136 mg/dL — ABNORMAL HIGH (ref 70–99)
Glucose-Capillary: 355 mg/dL — ABNORMAL HIGH (ref 70–99)
Glucose-Capillary: 69 mg/dL — ABNORMAL LOW (ref 70–99)
Glucose-Capillary: 96 mg/dL (ref 70–99)
Glucose-Capillary: 255 mg/dL — ABNORMAL HIGH (ref 70–99)

## 2022-09-27 LAB — CREATININE, SERUM
Creatinine, Ser: 1.53 mg/dL — ABNORMAL HIGH (ref 0.44–1.00)
GFR, Estimated: 32 mL/min — ABNORMAL LOW (ref >60)

## 2022-09-27 LAB — RAPID URINE DRUG SCREEN, HOSP PERFORMED
Amphetamines: NOT DETECTED
Barbiturates: NOT DETECTED
Benzodiazepines: NOT DETECTED
Cocaine: NOT DETECTED
Opiates: NOT DETECTED
Tetrahydrocannabinol: NOT DETECTED

## 2022-09-27 LAB — PHOSPHORUS
Phosphorus: 1.9 mg/dL — ABNORMAL LOW (ref 2.5–4.6)
Phosphorus: 4.5 mg/dL (ref 2.5–4.6)

## 2022-09-27 LAB — HEMOGLOBIN A1C
Hgb A1c MFr Bld: 9.7 % — ABNORMAL HIGH (ref 4.8–5.6)
Mean Plasma Glucose: 231.69 mg/dL

## 2022-09-27 LAB — MAGNESIUM: Magnesium: 1.9 mg/dL (ref 1.7–2.4)

## 2022-09-27 MED ORDER — SERTRALINE HCL 100 MG PO TABS
100.0000 mg | ORAL_TABLET | Freq: Every day | ORAL | Status: DC
  Administered 2022-09-27 – 2022-10-08 (×11): 100 mg via ORAL
  Filled 2022-09-27 (×11): qty 1

## 2022-09-27 MED ORDER — DEXTROSE 50 % IV SOLN: 25.0000 g | INTRAVENOUS | Status: AC

## 2022-09-27 MED ORDER — ALBUTEROL SULFATE (2.5 MG/3ML) 0.083% IN NEBU
2.5000 mg | INHALATION_SOLUTION | Freq: Four times a day (QID) | RESPIRATORY_TRACT | Status: DC | PRN
  Administered 2022-09-28 – 2022-09-29 (×2): 2.5 mg via RESPIRATORY_TRACT
  Filled 2022-09-27 (×2): qty 3

## 2022-09-27 MED ORDER — METOPROLOL SUCCINATE ER 25 MG PO TB24
12.5000 mg | ORAL_TABLET | Freq: Every day | ORAL | Status: DC
  Administered 2022-09-27 – 2022-10-01 (×5): 12.5 mg via ORAL
  Filled 2022-09-27 (×4): qty 1

## 2022-09-27 MED ORDER — POLYETHYLENE GLYCOL 3350 17 G PO PACK: 17.0000 g | PACK | Freq: Every day | ORAL | Status: DC | PRN

## 2022-09-27 MED ORDER — HALOPERIDOL LACTATE 5 MG/ML IJ SOLN
1.0000 mg | Freq: Once | INTRAMUSCULAR | Status: DC
  Filled 2022-09-27: qty 1

## 2022-09-27 MED ORDER — DOCUSATE SODIUM 100 MG PO CAPS
200.0000 mg | ORAL_CAPSULE | Freq: Every day | ORAL | Status: DC
  Administered 2022-09-28 – 2022-10-07 (×5): 200 mg via ORAL
  Filled 2022-09-27 (×5): qty 2

## 2022-09-27 MED ORDER — MIRTAZAPINE 15 MG PO TABS
15.0000 mg | ORAL_TABLET | Freq: Every day | ORAL | Status: DC
  Administered 2022-09-27 – 2022-10-07 (×9): 15 mg via ORAL
  Filled 2022-09-27 (×9): qty 1

## 2022-09-27 MED ORDER — ASPIRIN 325 MG PO TBEC
325.0000 mg | DELAYED_RELEASE_TABLET | Freq: Every day | ORAL | Status: DC
  Administered 2022-09-27 – 2022-10-01 (×5): 325 mg via ORAL
  Filled 2022-09-27 (×5): qty 1

## 2022-09-27 MED ORDER — INSULIN ASPART 100 UNIT/ML IJ SOLN
0.0000 [IU] | Freq: Three times a day (TID) | INTRAMUSCULAR | Status: DC
  Administered 2022-09-27 – 2022-09-28 (×2): 9 [IU] via SUBCUTANEOUS
  Administered 2022-09-28 (×2): 3 [IU] via SUBCUTANEOUS
  Administered 2022-09-29: 7 [IU] via SUBCUTANEOUS
  Administered 2022-09-29 – 2022-09-30 (×4): 2 [IU] via SUBCUTANEOUS
  Administered 2022-09-30: 5 [IU] via SUBCUTANEOUS

## 2022-09-27 MED ORDER — AMLODIPINE BESYLATE 10 MG PO TABS
10.0000 mg | ORAL_TABLET | Freq: Every day | ORAL | Status: DC
  Administered 2022-09-27 – 2022-10-08 (×11): 10 mg via ORAL
  Filled 2022-09-27 (×11): qty 1

## 2022-09-27 MED ORDER — DEXTROSE 50 % IV SOLN
INTRAVENOUS | Status: AC
  Filled 2022-09-27: qty 50

## 2022-09-27 MED ORDER — HALOPERIDOL LACTATE 5 MG/ML IJ SOLN
2.0000 mg | Freq: Four times a day (QID) | INTRAMUSCULAR | Status: DC | PRN
  Administered 2022-09-27: 2 mg via INTRAVENOUS

## 2022-09-27 MED ORDER — VITAMIN B-12 1000 MCG PO TABS
1000.0000 ug | ORAL_TABLET | Freq: Every day | ORAL | Status: DC
  Administered 2022-09-27 – 2022-10-08 (×10): 1000 ug via ORAL
  Filled 2022-09-27 (×10): qty 1

## 2022-09-27 MED ORDER — ACETAMINOPHEN 325 MG PO TABS
650.0000 mg | ORAL_TABLET | Freq: Four times a day (QID) | ORAL | Status: DC | PRN
  Administered 2022-10-01 – 2022-10-08 (×2): 650 mg via ORAL
  Filled 2022-09-27 (×2): qty 2

## 2022-09-27 MED ORDER — DEXTROSE 10 % IV SOLN: INTRAVENOUS | Status: AC

## 2022-09-27 MED ORDER — HYDRALAZINE HCL 20 MG/ML IJ SOLN: 10.0000 mg | Freq: Four times a day (QID) | INTRAMUSCULAR | Status: DC | PRN

## 2022-09-27 MED ORDER — SODIUM CHLORIDE 0.9 % IV SOLN: INTRAVENOUS | Status: DC

## 2022-09-27 MED ORDER — HYDRALAZINE HCL 20 MG/ML IJ SOLN: 5.0000 mg | Freq: Four times a day (QID) | INTRAMUSCULAR | Status: DC | PRN

## 2022-09-27 MED ORDER — MOMETASONE FURO-FORMOTEROL FUM 200-5 MCG/ACT IN AERO
2.0000 | INHALATION_SPRAY | Freq: Two times a day (BID) | RESPIRATORY_TRACT | Status: DC
  Administered 2022-09-28 – 2022-09-30 (×3): 2 via RESPIRATORY_TRACT
  Filled 2022-09-27: qty 8.8

## 2022-09-27 MED ORDER — HALOPERIDOL LACTATE 5 MG/ML IJ SOLN
1.0000 mg | Freq: Four times a day (QID) | INTRAMUSCULAR | Status: DC | PRN
  Administered 2022-09-27 (×2): 1 mg via INTRAVENOUS
  Filled 2022-09-27 (×2): qty 1

## 2022-09-27 MED ORDER — POTASSIUM CHLORIDE 10 MEQ/100ML IV SOLN: 10.0000 meq | INTRAVENOUS | Status: DC

## 2022-09-27 MED ORDER — DEXTROSE 50 % IV SOLN
INTRAVENOUS | Status: AC
  Administered 2022-09-27: 50 mL
  Filled 2022-09-27: qty 50

## 2022-09-27 MED ORDER — PANTOPRAZOLE SODIUM 40 MG PO TBEC
40.0000 mg | DELAYED_RELEASE_TABLET | Freq: Every day | ORAL | Status: DC
  Administered 2022-09-27 – 2022-10-05 (×8): 40 mg via ORAL
  Filled 2022-09-27 (×8): qty 1

## 2022-09-27 MED ORDER — DEXTROSE 50 % IV SOLN
25.0000 g | INTRAVENOUS | Status: AC
  Administered 2022-09-27: 25 g via INTRAVENOUS

## 2022-09-27 MED ORDER — DEXTROSE 50 % IV SOLN
INTRAVENOUS | Status: AC
  Administered 2022-09-27: 25 g via INTRAVENOUS
  Filled 2022-09-27: qty 50

## 2022-09-27 MED ORDER — POTASSIUM PHOSPHATES 15 MMOLE/5ML IV SOLN
30.0000 mmol | Freq: Once | INTRAVENOUS | Status: AC
  Administered 2022-09-27: 30 mmol via INTRAVENOUS
  Filled 2022-09-27: qty 10

## 2022-09-27 MED ORDER — SODIUM CHLORIDE 0.9 % IV SOLN
2.0000 g | INTRAVENOUS | Status: DC
  Administered 2022-09-27 (×2): 2 g via INTRAVENOUS
  Filled 2022-09-27 (×2): qty 12.5

## 2022-09-27 MED ORDER — VITAMIN D 25 MCG (1000 UNIT) PO TABS
1000.0000 [IU] | ORAL_TABLET | Freq: Every day | ORAL | Status: DC
  Administered 2022-09-27 – 2022-10-08 (×10): 1000 [IU] via ORAL
  Filled 2022-09-27 (×10): qty 1

## 2022-09-27 MED ORDER — POTASSIUM CHLORIDE 10 MEQ/100ML IV SOLN
10.0000 meq | INTRAVENOUS | Status: AC
  Administered 2022-09-27 (×3): 10 meq via INTRAVENOUS
  Filled 2022-09-27 (×2): qty 100

## 2022-09-27 NOTE — Progress Notes (Signed)
Hypoglycemic Event  CBG: 29  Treatment: D50 50 mL (25 gm)  Symptoms: Sweaty  Follow-up CBG: Time: 0375 CBG Result:154  Possible Reasons for Event: Medication regimen: previous sliding scale  Comments/MD notified:yes    Fransisco Hertz

## 2022-09-27 NOTE — TOC Initial Note (Addendum)
Transition of Care [facility]) - Initial/Assessment Note    Patient Details  Name: Renee [name] MRN: [id number] Date of Birth: [birth year]  Transition of Care [facility]) CM/SW Contact:    Henrietta Dine, RN Phone Number: 09/27/2022, 2:39 PM  Clinical Narrative:                 TOC consut for HHPT; pt asleep; spoke w/ caregiver/POA Granville Lewis [phone]); she says pt is from home and plans to return at d/c; Ms Charlie Pitter says pt has transportation; she says pt does not experience IPV, food insecurity, or problems paying utilities; she says pt wears glasses, upper partial, and bilateral HA; pt's caregiver said she needs a new walker b/c the leg broke off her old one; ; PT and MD notified; awaiting orders; pt also has BSC, shower chair, and bars in shower; pt has HHPT w/ [facility] and caregiver would like to con't services; pt also has home oxygen w/ [employer] and caregiver would also like to con't services; she says pt does not have travel tank; notified Claiborne Billings at [facility] and she ways services will be resumed at d/c; notified Jermaine at [facility] and he says services will be resumed at d/c and a travel tank will be delivered to pt's room; caregiver would like bed rails and will contact pt's insurance to see if covered; contact info for agencies placed in follow up provider section of d/c instructions; caregiver verbalized understanding; TOC will follow.  Expected Discharge Plan: [place] Barriers to Discharge: Continued Medical Work up   Patient Goals and CMS Choice Patient states their goals for this hospitalization and ongoing recovery are:: home per pt's [place]          Expected Discharge Plan and Services   Discharge Planning Services: CM Consult Post Acute Care Choice: Durable Medical Equipment, Resumption of Svcs/PTA Provider ([place] w/ [facility]; Home oxygen w/ [employer]; walker, BSC, shower chair) Living arrangements for the past 2 months: [place]                    DME Agency: [employer] Date DME Agency Contacted: 09/27/22 Time DME Agency Contacted: [phone] Representative spoke with at DME Agency: [facility] HH Arranged: PT [place]: Luis Lopez Date [place]: 09/27/22 Time [place]: [phone] Representative spoke with at [place]: Claiborne Billings  Prior Living Arrangements/Services Living arrangements for the past 2 months: [place] with:: Other (Comment) (Caregiver) Patient language and need for interpreter reviewed:: Yes Do you feel safe going back to the place where you live?: Yes      Need for Family Participation in Patient Care: Yes (Comment) Care giver support system in place?: Yes (comment) Current home services: DME, Home PT (walker, home oxygen shower chair) Criminal Activity/Legal Involvement Pertinent to Current Situation/Hospitalization: No - Comment as needed  Activities of Daily Living Home Assistive Devices/Equipment: Gilford Rile (specify type) ADL Screening (condition at time of admission) Patient's cognitive ability adequate to safely complete daily activities?: No Is the patient deaf or have difficulty hearing?: Yes Does the patient have difficulty seeing, even when wearing glasses/contacts?: No Does the patient have difficulty concentrating, remembering, or making decisions?: Yes Patient able to express need for assistance with ADLs?: Yes Does the patient have difficulty dressing or bathing?: Yes Independently performs ADLs?: Yes (appropriate for developmental age) Does the patient have difficulty walking or climbing stairs?: Yes Weakness of Legs: Left Weakness of Arms/Hands: Left  Permission Sought/Granted Permission sought to share information with : Case Manager Permission granted to share information with : Yes, Verbal Permission Granted  Share Information with NAME: Lenor Coffin, RN, CM     Permission granted to share info w Relationship: Granville Lewis [facility])  [phone]     Emotional Assessment Appearance:: Appears stated age Attitude/Demeanor/Rapport: Unable to Assess (pt asleep; uta) Affect (typically observed): Unable to Assess (pt asleep; uta) Orientation: :  (pt asleep; uta) Alcohol / Substance Use: Not Applicable Psych Involvement: No (comment)  Admission diagnosis:  Somnolence [W54.6] Acute metabolic encephalopathy [E70.35] Patient Active Problem List   Diagnosis Date Noted   Dysphagia 00/93/8182   Acute metabolic encephalopathy 99/37/1696   Encounter for competency evaluation 09/14/2022   Fatigue 07/04/2022   History of UTI 02/28/2022   Physical deconditioning 01/19/2022   Chronic respiratory failure with hypoxia ([place]) 01/19/2022   Asthma, chronic, unspecified asthma severity, with acute exacerbation 01/06/2022   Allergic rhinitis 01/06/2022   Chronic diastolic CHF (congestive heart failure) ([place]) 01/06/2022   Obesity (BMI 30-39.9) 01/06/2022   Acute respiratory failure with hypoxia (HCC) 01/05/2022   Bradycardia 09/01/2021   UTI (urinary tract infection) 08/29/2021   Wheezing on expiration 08/29/2021   Sepsis due to pneumonia ([place]) 06/07/2020   Pleural effusion 05/13/2020   Frequent UTI 05/13/2020   Dizziness 02/12/2020   Normocytic anemia 02/12/2020   Fall involving sidewalk curb 11/29/2019   Contusion of back 11/27/2019   Aortic atherosclerosis ([place]) 07/13/2019   CKD (chronic kidney disease) stage 4, GFR 15-29 ml/min (HCC) 07/01/2019   External hemorrhoid 01/17/2018   History of colitis 01/17/2018   Generalized weakness 12/24/2016   Diabetic retinopathy ([place]) 12/11/2016   History of CVA (cerebrovascular accident) 12/11/2016   Weakness secondary to UTI 10/21/2015   Acute renal failure superimposed on stage 3b chronic kidney disease ([place]) 10/12/2015   CAP (community acquired pneumonia) 10/07/2015   Estrogen deficiency 08/30/2015   Mobility impaired 06/26/2011   History of retinal detachment 01/10/2011   Sleep  apnea 11/28/2010   Anxiety and depression 08/25/2010   Hemiplegia, late effect of cerebrovascular disease ([place]) 07/05/2010   POSTHERPETIC NEURALGIA 11/09/2009   Renal insufficiency 06/29/2008   Chronic back pain 01/26/2008   EDEMA 01/26/2008   B12 deficiency 01/10/2007   Type 2 diabetes, controlled, with retinopathy ([place]) 11/27/2006   Essential hypertension 11/27/2006   FIBROCYSTIC BREAST DISEASE 11/27/2006   ROSACEA 11/27/2006   OSTEOARTHRITIS 11/27/2006   Urinary incontinence 11/27/2006   PCP:  Girtha Rm, NP-C Pharmacy:   [id number] [place] [facility] [id number]: [phone]: [phone]    Social Determinants of Health (SDOH) Social History: SDOH Screenings   Food Insecurity: No Food Insecurity (09/27/2022)  Housing: Low Risk  (09/27/2022)  Transportation Needs: No Transportation Needs (09/27/2022)  Utilities: Not At Risk (09/27/2022)  Alcohol Screen: Low Risk  (05/30/2022)  Depression (PHQ2-9): Low Risk  (05/30/2022)  Financial Resource Strain: Low Risk  (05/30/2022)  Physical Activity: Inactive (05/30/2022)  Social Connections: Unknown (05/30/2022)  Stress: No Stress Concern Present (05/30/2022)  Tobacco Use: Low Risk  (09/22/2022)   SDOH Interventions:     Readmission Risk Interventions    09/27/2022    2:24 PM 06/10/2020    4:24 PM  Readmission Risk Prevention Plan  Transportation Screening Complete   PCP or Specialist Appt within 3-5 Days Complete   HRI or Home Care Consult Complete  Social Work Consult for [place] Planning/Counseling [place] Not Applicable   Medication Review Press photographer) Complete   PCP or Specialist appointment within 3-5 days of discharge  Complete

## 2022-09-27 NOTE — Evaluation (Signed)
SLP Cancellation Note  Patient Details Name: Renee [name] MRN: [id number] DOB: July 10, 1929   Cancelled treatment:       Reason Eval/Treat Not Completed: Other (comment) (per MD notes, swallow evaluation was declined by pt's HCPOA, please reorder if indicated.)   Macario Golds 09/27/2022, 3:14 PM   Kathleen Lime, [place] Office [phone]

## 2022-09-27 NOTE — Progress Notes (Signed)
[facility]                                                                              Rayme Bui, is a 89 y.o. female, DOB - 1929/05/27, AVW:[id number] Admit date - 09/26/2022    Outpatient Primary MD for the patient is Raenette Rover, Vickie L, NP-C  LOS - 1  days  Chief Complaint  Patient presents with   Altered Mental Status       Brief summary   Patient is a 89 year old female with chronic diastolic CHF, nocturnal hypoxemia, recent diagnosis of UTI and on antibiotics, history of prior CVA with left-sided weakness, dysphagia, laryngeal pharyngeal reflux disease, physical deconditioning, chronic anxiety/depression, mobility impairment, chronic respiratory failure with hypoxia presented from her PCP due to altered mental status, lethargy and hypersomnolence.  Patient had a recent traumatic fall injuring her lower back and right hip, no reported fractures.  After evaluation in PCP office, she was referred to ED for further workup. In ED, afebrile, noted to have copious sputum, was suctioned by RT with improvement, no lobular infiltrates noted on CXR, elevated BNP and received IV Lasix in ED. Also noted to have hyperglycemia with serum glucose in 500s, was treated with Semglee and subcu NovoLog Vital signs stable, O2 sats 100% on 3 L.  BNP 296.7.  Serum sodium 132, glucose 519, creatinine 1.53.  Assessment & Plan    Principal Problem:   Acute metabolic encephalopathy -Presented with lethargy and hypersomnolence.  Had been recently diagnosed and ongoing treatment for UTI. -Patient is now alert and waking up, speaking few words (at baseline, per her POA, does not talk since stroke). -Continue current management, follow urine culture, delirium precautions, fall precautions, aspiration precautions. -UDS negative, follow blood cultures, urine culture, respiratory virus panel negative -Creatinine improving.  CT head with no acute intracranial abnormality.   Active  Problems: Recently diagnosed UTI, POA -Follow urine culture and sensitivities, patient was on Keflex and Levaquin PTA -Started on IV cefepime  Diabetes mellitus type 2, with hyperglycemia and then subsequent hypoglycemia -Blood glucose 509 on admission, patient received NovoLog subcu 15 units in ED, subsequently hypoglycemic with CBG down to 29 -Placed on D10 drip, CBGs improving, will change to D5 once D10 drip is completed    History of CVA (cerebrovascular accident) with residual left-sided hemiparesis, dysphagia, laryngal pharyngeal reflux disease -Has longstanding dysphagia, prior MBS negative for aspiration.  Seen by ENT on 09/14/2022, patient had declined esophagogram.  Was placed on Prilosec to address silent reflux. -Patient's POA declined another SLP test at this time -Will try with clears and advance as tolerated    Asthma, chronic, unspecified asthma severity, with acute exacerbation -Currently no acute wheezing    Chronic diastolic CHF (congestive heart failure) (HCC) -Currently stable, BNP 296, received Lasix IV in ED, -Hold off on diuretics for now, follow I's and O's closely    Essential hypertension -BP elevated, continue Toprol-XL -will place on IV hydralazine as needed with parameters  Mild acute on CKD (chronic kidney disease) stage 4, GFR 15-29 ml/min (HCC) -Creatinine 1.53 on 2/7, improving to 1.2, currently on gentle hydration.  GERD -Continue PPI  Chronic respiratory failure with hypoxia (HCC) -At baseline on 6 L O2 via [place] at night  Physical debility, recent traumatic fall a week ago, right hip bruising from recent fall -PT OT assessment.  Per POA, has not been ambulating.  Code Status: full code  DVT Prophylaxis:  enoxaparin (LOVENOX) injection 30 mg Start: 09/27/22 1000   Level of Care: Level of care: Progressive Family Communication: Updated patient's caregiver/POA, [name] at the bedside Disposition Plan:      Remains inpatient appropriate:     Procedures:  None  Consultants:   None  Antimicrobials:   Anti-infectives (From admission, onward)    Start     Dose/Rate Route Frequency Ordered Stop   09/27/22 0100  ceFEPIme (MAXIPIME) 2 g in sodium chloride 0.9 % 100 mL IVPB        2 g 200 mL/hr over 30 Minutes Intravenous Every 24 hours 09/27/22 0039            Medications  amLODipine  10 mg Oral Daily   cyanocobalamin  1,000 mcg Oral Daily   enoxaparin (LOVENOX) injection  30 mg Subcutaneous Q24H   metoprolol succinate  12.5 mg Oral Daily   pantoprazole  40 mg Oral Daily   sertraline  100 mg Oral Daily      Subjective:   Tomeka Hannis was seen and examined today.  Alert and awake, able to say good morning and "no" appropriately.  At baseline, does not talk much per the caregiver at bedside.  Difficult to obtain review of system from the patient.  CBGs now improving.  No fevers or chills, overnight issues noted.  Objective:   Vitals:   09/27/22 0608 09/27/22 0611 09/27/22 0715 09/27/22 0954  BP: (!) 188/58 (!) 145/56  (!) 125/114  Pulse: 62   65  Resp:   17 14  Temp: 97.7 F (36.5 C)   97.7 F (36.5 C)  TempSrc: Oral   Oral  SpO2: 96%   99%  Weight:      Height:        Intake/Output Summary (Last 24 hours) at 09/27/2022 1106 Last data filed at 09/27/2022 0300 Gross per 24 hour  Intake 600 ml  Output 150 ml  Net 450 ml     Wt Readings from Last 3 Encounters:  09/27/22 74.5 kg  09/12/22 75.8 kg  08/01/22 80.7 kg     Exam General: Alert and awake, not in any acute distress. Cardiovascular: S1 S2 auscultated,  RRR Respiratory: Clear to auscultation bilaterally, no wheezing Gastrointestinal: Soft, nontender, nondistended, + bowel sounds Ext: no pedal edema bilaterally Neuro: left-sided hemiparesis Skin: No rashes Psych:     Data Reviewed:  I have personally reviewed following labs    CBC Lab Results  Component Value Date   WBC 15.5 (H) 09/27/2022   RBC 3.30 (L) 09/27/2022   HGB  9.2 (L) 09/27/2022   HCT 30.6 (L) 09/27/2022   MCV 92.7 09/27/2022   MCH 27.9 09/27/2022   PLT 236 09/27/2022   MCHC 30.1 09/27/2022   RDW 14.6 09/27/2022   LYMPHSABS 1.0 09/26/2022   MONOABS 0.5 09/26/2022   EOSABS 0.1 09/26/2022   BASOSABS 0.0 71/69/6789     Last metabolic panel Lab Results  Component Value Date   NA 137 09/27/2022   K 3.0 (L) 09/27/2022   CL 99 09/27/2022   CO2 26 09/27/2022   BUN 22 09/27/2022   CREATININE 1.27 (H) 09/27/2022   GLUCOSE 95 09/27/2022   GFRNONAA 40 (L)  09/27/2022   GFRAA 37 (L) 12/19/2020   CALCIUM 8.7 (L) 09/27/2022   PHOS 1.9 (L) 09/27/2022   PROT 6.7 09/26/2022   ALBUMIN 3.6 09/26/2022   BILITOT 0.8 09/26/2022   ALKPHOS 324 (H) 09/26/2022   AST 26 09/26/2022   ALT 25 09/26/2022   ANIONGAP 12 09/27/2022    CBG (last 3)  Recent Labs    09/27/22 0624 09/27/22 0656 09/27/22 0818  GLUCAP 62* 146* 136*      Coagulation Profile: No results for input(s): "INR", "PROTIME" in the last 168 hours.   Radiology Studies: I have personally reviewed the imaging studies  CT HEAD WO CONTRAST (5MM)  Result Date: 09/26/2022 CLINICAL DATA:  Slurred speech, left-sided weakness, previous stroke EXAM: CT HEAD WITHOUT CONTRAST TECHNIQUE: Contiguous axial images were obtained from the base of the skull through the vertex without intravenous contrast. RADIATION DOSE REDUCTION: This exam was performed according to the departmental dose-optimization program which includes automated exposure control, adjustment of the mA and/or kV according to patient size and/or use of iterative reconstruction technique. COMPARISON:  08/29/2021 FINDINGS: Evaluation is limited by patient motion. Brain: Stable encephalomalacia within the right basal ganglia consistent with prior infarct. Chronic small vessel ischemic changes are seen within the periventricular white matter, right greater than left, stable. No evidence of acute infarct or hemorrhage. Lateral ventricles and  remaining midline structures are unremarkable. No acute extra-axial fluid collections. No mass effect. Vascular: No hyperdense vessel or unexpected calcification. Skull: Normal. Negative for fracture or focal lesion. Sinuses/Orbits: No acute finding. Other: None. IMPRESSION: 1. Stable chronic ischemic changes as above. No acute intracranial process. Electronically Signed   By: Randa Ngo M.D.   On: 09/26/2022 16:37   DG Chest Portable 1 View  Result Date: 09/26/2022 CLINICAL DATA:  Cough, altered level of consciousness, left-sided weakness EXAM: PORTABLE CHEST 1 VIEW COMPARISON:  09/12/2022 FINDINGS: Single frontal view of the chest demonstrates a stable cardiac silhouette. Lung volumes are diminished, without airspace disease, effusion, or pneumothorax. No acute bony abnormality. IMPRESSION: 1. Low lung volumes.  No acute process. Electronically Signed   By: Randa Ngo M.D.   On: 09/26/2022 16:11       Karli Wickizer M.D. [facility] 09/27/2022, 11:06 AM  Available via Epic secure chat 7am-7pm After 7 pm, please refer to night coverage provider listed on amion.

## 2022-09-27 NOTE — H&P (Addendum)
History and Physical  Renee [name] EHU:[id number] DOB: 06-15-1929 DOA: 09/26/2022  Referring physician: Dr. Tomi Bamberger, [place] PCP: Girtha Rm, NP-C  Outpatient Specialists: Cardiology, neurology. Patient coming from: Home when she lives with her caregiver and medical POA Ms. Granville Lewis.  Chief Complaint: Altered mental status.  HPI: Renee [name] is a 89 y.o. female with medical history significant for chronic diastolic CHF, nocturnal hypoxemia, recent diagnosis of UTI and on antibiotics, history of CVA with left-sided weakness, laryngopharyngeal reflux disease, pharyngoesophageal dysphagia, bilateral chronic otitis media with effusion status post prior ear tube placement, physical deconditioning, chronic anxiety/depression, mobility impairment, chronic respiratory failure with hypoxia, who presented to [facility] ED from her PCP due to lethargy and hypersomnolence.  The patient had a recent traumatic fall injuring her lower back and right hip.  No reported fractures.  She initially presented to her PCP for lethargy and hypersomnolence.  After evaluation in the office she was referred to the ED for further workup, and to determine the underlying etiology of her symptomatology.  In the ED, afebrile.  Noted to have copious [place] sputum.  Suctioned by RT with improvement.  No lobular infiltrates noted on chest x-ray to suggest pneumonia.  BNP elevated, concern for acute CHF.  IV lasix ordered in the ED.  Lab studies notable for hyperglycemia with serum glucose in the 500s.  She was treated with SQ NovoLog and Semglee.  TRH, hospitalist service, was asked to admit.  Seen at her bedside.  Accompanied by her medical POA who is also her caregiver.  The patient is alert but confused.  Hard of hearing.  ED Course: Tmax 97.8.  BP 173/63, pulse 66, respiration rate 22, O2 saturation 100% on 3 L.  Lab studies remarkable for BNP 296.7.WBC 8.3, hemoglobin 9.5, platelet count 195.  Serum sodium 132, glucose 519,  creatinine 1.53, GFR 32.  Review of Systems: Review of systems as noted in the HPI. All other systems reviewed and are negative.   Past Medical History:  Diagnosis Date   Angioedema    possibly from voltaren   Bronchopneumonia 12/11/2016   Degenerative disc disease    Diabetes mellitus    type II   Hyperlipidemia    Hypertension    LVH (left ventricular hypertrophy)    and atrial enlargement by echo in past with nl EF   Nasal pruritis    Osteoarthritis    Osteopenia    Renal insufficiency    Sleep apnea    Stroke ([place]) 05/2010   Small vessel sobcortical (in [place] trial) with Dr Leonie Man, residual L hemiparesis   Vitamin B 12 deficiency 04/08   Past Surgical History:  Procedure Laterality Date   ABDOMINAL HYSTERECTOMY     BSO-fibroids   APPENDECTOMY     BACK SURGERY     COLON SURGERY     due to punctured intestines   EYE SURGERY     cataract extraction   IR THORACENTESIS ASP PLEURAL SPACE W/IMG GUIDE  06/07/2020   KNEE SURGERY     arthroscope   PARS PLANA VITRECTOMY  07/31/2011   Procedure: PARS PLANA VITRECTOMY WITH 25 GAUGE;  Surgeon: [name], MD;  Location: [facility];  Service: Ophthalmology;  Laterality: Right;  REMOVAL OF SILICONE OIL AND LASER RIGHT EYE   RETINAL DETACHMENT SURGERY  02/18/11   times 2   SPINE SURGERY  08/09   spinal decompression surgery    Social History:  reports that she has never smoked. She has never  been exposed to tobacco smoke. She has never used smokeless tobacco. She reports that she does not drink alcohol and does not use drugs.   Allergies  Allergen Reactions   Tape Other (See Comments)    The patient's skin is VERY THIN- will TEAR EASILY!!   Voltaren [Diclofenac] Swelling and Other (See Comments)    Angioedema    Family History  Problem Relation Age of Onset   COPD Brother    Cancer Sister        brain tumor with hemmorhage   Heart disease Sister        CAD      Prior to Admission medications   Medication Sig  Start Date End Date Taking? Authorizing Provider  sertraline (ZOLOFT) 100 MG tablet Take 1 tablet (100 mg total) by mouth daily. Patient taking differently: Take 50 mg by mouth in the morning and at bedtime. 09/12/22  Yes Henson, Vickie L, NP-C  acetaminophen (TYLENOL) 500 MG tablet Take 1,000 mg by mouth at bedtime.    [provider]  albuterol (PROVENTIL) (2.5 MG/3ML) 0.083% nebulizer solution Take 3 mLs (2.5 mg total) by nebulization every 6 (six) hours as needed for wheezing or shortness of breath. 01/19/22 01/19/23  Parrett, Fonnie Mu, NP  albuterol (VENTOLIN HFA) 108 (90 Base) MCG/ACT inhaler INHALE 2 PUFFS INTO THE LUNGS IN THE MORNING, AT NOON, AND AT BEDTIME. 08/24/22   Chesley Mires, MD  amLODipine (NORVASC) 10 MG tablet TAKE 1 TABLET BY MOUTH EVERY DAY Patient taking differently: Take 10 mg by mouth daily. 11/14/21   [name], MD  aspirin EC 325 MG tablet Take 325 mg by mouth in the morning.    [provider]  budesonide-formoterol (SYMBICORT) 160-4.5 MCG/ACT inhaler Inhale 2 puffs into the lungs 2 (two) times daily. 08/03/22   Chesley Mires, MD  calcium-vitamin D (OSCAL WITH D) 500-200 MG-UNIT per tablet Take 1 tablet by mouth daily.    [provider]  cephALEXin (KEFLEX) 500 MG capsule Take 1 capsule (500 mg total) by mouth 2 (two) times daily for 7 days. 09/22/22 09/29/22  Leath-Warren, Alda Lea, NP  Cholecalciferol (VITAMIN D-3) 25 MCG (1000 UT) CAPS Take 1,000 Units by mouth in the morning.    [provider]  Cranberry 500 MG TABS Take 500 mg by mouth in the morning and at bedtime.    [provider]  docusate sodium (COLACE) 100 MG capsule Take 200 mg by mouth at bedtime.    [provider]  estradiol (ESTRACE) 0.1 MG/GM vaginal cream Place 1 g vaginally daily. 04/25/21   [provider]  FLONASE 50 MCG/ACT nasal spray Place 2 sprays into both nostrils See admin instructions. Instill 2 sprays into each nostril at bedtime     [provider]  furosemide (LASIX) 20 MG tablet Take 0.5 tablets (10 mg total) by mouth daily. 08/22/22   Henson, Vickie L, NP-C  glipiZIDE (GLUCOTROL XL) 2.5 MG 24 hr tablet Take 1 tablet (2.5 mg total) by mouth daily with breakfast. 08/22/22   Henson, Vickie L, NP-C  glucose blood (ONETOUCH ULTRA) test strip CHECK BLOOD SUGAR ONCE DAILY AND AS DIRECTED. 10/18/21   [name], MD  hydrALAZINE (APRESOLINE) 25 MG tablet TAKE 1 TABLET BY MOUTH EVERY 8 HOURS. 09/24/22   Henson, Vickie L, NP-C  Lancets (ONETOUCH DELICA PLUS [id number]) MISC CHECK BLOOD SUGAR ONCE DAILY AND AS DIRECTED 08/01/21   [name], MD  levofloxacin (LEVAQUIN) 250 MG tablet Take  1 tablet (250 mg total) by mouth daily. 08/29/22   Henson, Vickie L, NP-C  metoprolol succinate (TOPROL-XL) 25 MG 24 hr tablet Take 12.5 mg by mouth daily. 09/16/21   [provider]  mirtazapine (REMERON) 15 MG tablet TAKE 1 TABLET BY MOUTH EVERYDAY AT BEDTIME 03/23/22   Henson, Vickie L, NP-C  Multiple Vitamin (MULTIVITAMIN) capsule Take 1 capsule by mouth daily.    [provider]  Multiple Vitamins-Minerals (PRESERVISION AREDS 2+MULTI VIT) CAPS Take 1 capsule by mouth in the morning and at bedtime.    [provider]  Olopatadine HCl (PATADAY OP) Place 1 drop into both eyes 2 (two) times daily as needed (for itching or redness).    [provider]  omeprazole (PRILOSEC) 20 MG capsule Take by mouth. 09/14/22 03/13/23  [provider]  pantoprazole (PROTONIX) 40 MG tablet Take 1 tablet (40 mg total) by mouth daily at 6 (six) AM. 01/17/22   Eugenie Filler, MD  pioglitazone (ACTOS) 30 MG tablet Take 1 tablet (30 mg total) by mouth daily. 09/21/21   [name], MD  sitaGLIPtin (JANUVIA) 100 MG tablet Take 1 tablet (100 mg total) by mouth daily. 06/20/22   Henson, Vickie L, NP-C  solifenacin (VESICARE) 10 MG tablet TAKE 1 TABLET BY MOUTH EVERY DAY 09/24/22   Harland Dingwall L, NP-C   Spacer/Aero-Holding Chambers DEVI 1 each by Does not apply route at bedtime. Use with inhaler. 02/12/22   Chesley Mires, MD  trandolapril (MAVIK) 4 MG tablet TAKE 1 TABLET DAILY Patient taking differently: Take 4 mg by mouth daily. 10/30/21   [name], MD  vitamin B-12 (CYANOCOBALAMIN) 1000 MCG tablet Take 1,000 mcg by mouth daily.     [provider]    Physical Exam: BP (!) 183/69   Pulse 67   Temp 97.6 F (36.4 C)   Resp 18   Ht '5\' 4"'$  (1.626 m)   Wt 76 kg   SpO2 100%   BMI 28.76 kg/m   General: 89 y.o. year-old female well developed well nourished in no acute distress.  Alert and confused. Cardiovascular: Regular rate and rhythm with no rubs or gallops.  No thyromegaly or JVD noted.  No lower extremity edema. 2/4 pulses in all 4 extremities. Respiratory: Mild rales at bases with no wheezing .  Poor inspiratory effort. Abdomen: Soft nontender nondistended with normal bowel sounds x4 quadrants. Muskuloskeletal: No cyanosis, clubbing or edema noted bilaterally Neuro: CN II-XII intact, sensation, reflexes.  Left hemiparesis. Skin: No ulcerative lesions noted or rashes Psychiatry: Mood is appropriate for condition and setting          Labs on Admission:  Basic Metabolic Panel: Recent Labs  Lab 09/26/22 1533  NA 132*  K 3.8  CL 94*  CO2 29  GLUCOSE 509*  BUN 21  CREATININE 1.47*  CALCIUM 8.5*   Liver Function Tests: Recent Labs  Lab 09/26/22 1533  AST 26  ALT 25  ALKPHOS 324*  BILITOT 0.8  PROT 6.7  ALBUMIN 3.6   Recent Labs  Lab 09/26/22 1533  LIPASE 24   Recent Labs  Lab 09/26/22 1600  AMMONIA <10   CBC: Recent Labs  Lab 09/26/22 1533  WBC 8.5  NEUTROABS 6.8  HGB 9.9*  HCT 33.1*  MCV 93.2  PLT 207   Cardiac Enzymes: No results for input(s): "CKTOTAL", "CKMB", "CKMBINDEX", "TROPONINI" in the last 168 hours.  BNP (last 3 results) Recent Labs    01/05/22 1855 09/26/22 2120  BNP 243.4* 296.7*    ProBNP (last 3  results) Recent Labs    07/04/22 1140 08/24/22 1426  PROBNP 161.0* 298.0*    CBG: Recent Labs  Lab 09/26/22 1457 09/26/22 2317  GLUCAP 512* 519*    Radiological Exams on Admission: CT HEAD WO CONTRAST (5MM)  Result Date: 09/26/2022 CLINICAL DATA:  Slurred speech, left-sided weakness, previous stroke EXAM: CT HEAD WITHOUT CONTRAST TECHNIQUE: Contiguous axial images were obtained from the base of the skull through the vertex without intravenous contrast. RADIATION DOSE REDUCTION: This exam was performed according to the departmental dose-optimization program which includes automated exposure control, adjustment of the mA and/or kV according to patient size and/or use of iterative reconstruction technique. COMPARISON:  08/29/2021 FINDINGS: Evaluation is limited by patient motion. Brain: Stable encephalomalacia within the right basal ganglia consistent with prior infarct. Chronic small vessel ischemic changes are seen within the periventricular white matter, right greater than left, stable. No evidence of acute infarct or hemorrhage. Lateral ventricles and remaining midline structures are unremarkable. No acute extra-axial fluid collections. No mass effect. Vascular: No hyperdense vessel or unexpected calcification. Skull: Normal. Negative for fracture or focal lesion. Sinuses/Orbits: No acute finding. Other: None. IMPRESSION: 1. Stable chronic ischemic changes as above. No acute intracranial process. Electronically Signed   By: Randa Ngo M.D.   On: 09/26/2022 16:37   DG Chest Portable 1 View  Result Date: 09/26/2022 CLINICAL DATA:  Cough, altered level of consciousness, left-sided weakness EXAM: PORTABLE CHEST 1 VIEW COMPARISON:  09/12/2022 FINDINGS: Single frontal view of the chest demonstrates a stable cardiac silhouette. Lung volumes are diminished, without airspace disease, effusion, or pneumothorax. No acute bony abnormality. IMPRESSION: 1. Low lung volumes.  No acute process.  Electronically Signed   By: Randa Ngo M.D.   On: 09/26/2022 16:11    EKG: I independently viewed the EKG done and my findings are as followed: Sinus rhythm rate of 65.  Nonspecific ST-T changes.  QTc 464.  Assessment/Plan Present on Admission:  Acute metabolic encephalopathy  Principal Problem:   Acute metabolic encephalopathy  Acute metabolic encephalopathy, unclear etiology Noncontrast CT head nonacute. Recently diagnosed, ongoing treatment for UTI Urine culture in process, follow results. UDS pending Reorient as needed Delirium precautions Fall precautions Aspiration precautions.  Recently diagnosed UTI, POA Follow urine culture Resume home antibiotics, Keflex and Levaquin. Will start cefepime  Type 2 diabetes with hyperglycemia Obtain hemoglobin A1c Start insulin sliding scale every 4 hours while NPO  Pseudohyponatremia Serum sodium corrected for hyperglycemia 139 Monitor for now, hold off IV Lasix.  History of dysphagia, followed by ENT Speech therapist consultation N.p.o. until passes swallow evaluation Aspiration precautions  Mild acute on chronic diastolic CHF BNP 397. No significant peripheral edema. No pulmonary edema on chest x-ray. Will hold off diuretics for now with hyperglycemia Strict I's and O's and daily weight  Hypertension, BP is not at goal, elevated Resume home oral antihypertensive when no longer n.p.o. and passes a swallow evaluation IV hydralazine as needed with parameters Closely monitor vital signs.  CKD 3B Creatinine at baseline Avoid nephrotoxic agents, dehydration, and hypotension. Monitor urine output with strict I's and O's  Chronic anxiety/depression Resume home regimen when no longer n.p.o.  GERD Resume home PPI, intravenously while NPO.  Chronic nocturnal hypoxemia, on 6 L nasal cannula at baseline nightly Resume home regimen.  Physical debility Recent traumatic fall a week ago Right hip bruising from recent  fall PT OT assessment Fall precautions    Critical care time:  65 minutes    DVT prophylaxis: Subcu Lovenox daily  Code Status: Full code, per the patient's caregiver and also medical POA.  Family Communication: None at bedside.  Disposition Plan: Admitted to  Consults called: None.  Admission status: Inpatient status.   Status is: Inpatient The patient requires at least 2 midnights for further evaluation and treatment of present condition.   Kayleen Memos MD [facility] Pager [phone]  If 7PM-7AM, please contact night-coverage [email or web address] Password [facility]  09/27/2022, 12:07 AM

## 2022-09-27 NOTE — Significant Event (Signed)
Rapid Response Event Note   Notified by unit charge nurse that this patient's blood sugar has been low and they have contacted providers overnight requesting continuous fluids with dextrose. Patient has been slow to respond and requires significant stimulation to wake. D-50 being given at time of my arrival. Orders seen for D-10 infusion and assisted bedside RN in initiating. Repeat glucose was 146 and patient back to level of responsiveness equivalent with time of admission according to caregiver at bedside. No other significant findings from focused assessment. Bedside RN will notify rapid response if any changes or lack of improvement. Selinda Michaels, RN

## 2022-09-27 NOTE — Progress Notes (Signed)
FSBG 69. Patient eating full liquid dinner and drank 226m orange juice. Will recheck after dinner is consumed.

## 2022-09-27 NOTE — Evaluation (Signed)
Physical Therapy Evaluation Patient Details Name: Renee [name] MRN: [id number] DOB: 1929-07-02 Today's Date: 09/27/2022  History of Present Illness  Renee [name] is a 89 y.o. female admitted with acute metabolic encephalopathy. CT head with no acute intracranial abnormality. PMH: diabetes, CKD, CHF, previous stroke with chronic left-sided weakness, HTN, hyperlipidemia, and currently on antibiotics for urinary tract infection  Clinical Impression  Pt admitted with above diagnosis. Pt from home with caregiver, pt able to answer orientation questions slowly with increased time; caregiver [name] at bedside answers PLOF and home set up questions. Pt using RW around the home until Jan when diagnosed with UTI; caregiver reports pt has DME needs, caregiver assisting with self care as needed and was receiving HHPT prior to hospitalization. Pt currently needing max A to sit EOB and max A to return to supine, able to mobilize BLE in the bed with cues, denies pain with mobility. Pt needing mod A to maintain static supported sitting EOB despite UE support and cues. Caregiver reports goal is to bring pt home and continue caring for pt; would benefit from continued HHPT. Pt currently with functional limitations due to the deficits listed below (see PT Problem List). Pt will benefit from skilled PT to increase their independence and safety with mobility to allow discharge to the venue listed below.          Recommendations for follow up therapy are one component of a multi-disciplinary discharge planning process, led by the attending physician.  Recommendations may be updated based on patient status, additional functional criteria and insurance authorization.  Follow Up Recommendations Home health PT      Assistance Recommended at Discharge Frequent or constant Supervision/Assistance  Patient can return home with the following  A lot of help with bathing/dressing/bathroom;Assistance with  cooking/housework;Assist for transportation;Help with stairs or ramp for entrance;Two people to help with walking and/or transfers    Equipment Recommendations None recommended by PT  Recommendations for Other Services       Functional Status Assessment Patient has had a recent decline in their functional status and demonstrates the ability to make significant improvements in function in a reasonable and predictable amount of time.     Precautions / Restrictions Precautions Precautions: Fall Restrictions Weight Bearing Restrictions: No      Mobility  Bed Mobility Overal bed mobility: Needs Assistance Bed Mobility: Supine to Sit, Sit to Supine  Supine to sit: Max assist, HOB elevated Sit to supine: Max assist   General bed mobility comments: pt mobilizes BLE into abd and add when supine, ultimately needing max A to bring BLE to EOB and max A to upright trunk into sitting EOB, pt with poor sitting balance needing mod A to maintain static sitting    Transfers   Ambulation/Gait   Stairs            Wheelchair Mobility    Modified Rankin (Stroke Patients Only)       Balance Overall balance assessment: Needs assistance Sitting-balance support: Feet supported, Single extremity supported Sitting balance-Leahy Scale: Poor Sitting balance - Comments: mod A to prevent LOB in all directions              Pertinent Vitals/Pain Pain Assessment Pain Assessment: No/denies pain    Home Living Family/patient expects to be discharged to:: Private residence Living Arrangements: Other (Comment) (caregiver) Available Help at Discharge: Personal care attendant;Available 24 hours/day Type of Home: House Home Access: Ramped entrance  Home Layout: One level Home Equipment: Shower [place]  Walker (2 wheels);Transport chair;BSC/3in1;Grab bars - tub/shower;Rollator (4 wheels);Other (comment) (hoyer lift) Additional Comments: caregiver reports she moved in with pt in Jan and  is present 24/7    Prior Function Prior Level of Function : Needs assist  Mobility Comments: caregivers reports pt amb with RW around the home ADLs Comments: caregiver reports she assists pt as needed with self care     Hand Dominance   Dominant Hand: Right    Extremity/Trunk Assessment   Upper Extremity Assessment Upper Extremity Assessment:  (noted LUE weakness)    Lower Extremity Assessment Lower Extremity Assessment: Generalized weakness (AROM WFL, strength grossly 3/5)    Cervical / Trunk Assessment Cervical / Trunk Assessment: Kyphotic  Communication   Communication: No difficulties  Cognition Arousal/Alertness: Awake/alert, Lethargic Behavior During Therapy: Flat affect Overall Cognitive Status: Difficult to assess  General Comments: pt states name, DOB, age, hospital name, year and current president appropriately; pt needing increased time to follow commands and answer questions. Cregiver reports pt appears more tired than normal        General Comments General comments (skin integrity, edema, etc.): SpO2 100%, HR 68 and BP 187/71    Exercises     Assessment/Plan    PT Assessment Patient needs continued PT services  PT Problem List Decreased strength;Decreased activity tolerance;Decreased balance;Decreased mobility;Decreased cognition;Decreased knowledge of use of DME;Decreased safety awareness;Cardiopulmonary status limiting activity;Impaired tone       PT Treatment Interventions DME instruction;Gait training;Functional mobility training;Therapeutic activities;Therapeutic exercise;Balance training;Patient/family education;Neuromuscular re-education    PT Goals (Current goals can be found in the Care Plan section)  Acute Rehab PT Goals Patient Stated Goal: return home PT Goal Formulation: With family Time For Goal Achievement: 10/11/22 Potential to Achieve Goals: Fair    Frequency Min 2X/week     Co-evaluation               AM-PAC PT "6  Clicks" Mobility  Outcome Measure Help needed turning from your back to your side while in a flat bed without using bedrails?: A Lot Help needed moving from lying on your back to sitting on the side of a flat bed without using bedrails?: A Lot Help needed moving to and from a bed to a chair (including a wheelchair)?: Total Help needed standing up from a chair using your arms (e.g., wheelchair or bedside chair)?: Total Help needed to walk in hospital room?: Total Help needed climbing 3-5 steps with a railing? : Total 6 Click Score: 8    End of Session   Activity Tolerance: Patient tolerated treatment well Patient left: in bed;with call bell/phone within reach;with bed alarm set;with family/visitor present Nurse Communication: Mobility status;Other (comment) (headache at EOS) PT Visit Diagnosis: Other abnormalities of gait and mobility (R26.89);Muscle weakness (generalized) (M62.81)    Time: 7017-7939 PT Time Calculation (min) (ACUTE ONLY): 31 min   Charges:   PT Evaluation $PT Eval Moderate Complexity: 1 Mod PT Treatments $Therapeutic Activity: 8-22 mins         Tori Nygeria Lager PT, DPT 09/27/22, 11:47 AM

## 2022-09-27 NOTE — Progress Notes (Signed)
Pharmacy Antibiotic Note  Renee [name] is a 89 y.o. female admitted on 09/26/2022 with somnolence, altered mental status, and hyperglycemia. Marland Kitchen  Pharmacy has been consulted to dose cefepime for UTI  Plan: Cefepime 2gm IV q24h Follow renal function, cultures and clinical course  Height: '5\' 4"'$  (162.6 cm) Weight: 76 kg (167 lb 8.8 oz) IBW/kg (Calculated) : 54.7  Temp (24hrs), Avg:97.6 F (36.4 C), Min:97.4 F (36.3 C), Max:97.8 F (36.6 C)  Recent Labs  Lab 09/26/22 1533 09/26/22 2005 09/26/22 2350  WBC 8.5  --   --   CREATININE 1.47*  --  1.53*  LATICACIDVEN 1.0 1.1  --     Estimated Creatinine Clearance: 23.9 mL/min (A) (by C-G formula based on SCr of 1.53 mg/dL (H)).    Allergies  Allergen Reactions   Tape Other (See Comments)    The patient's skin is VERY THIN- will TEAR EASILY!!   Voltaren [Diclofenac] Swelling and Other (See Comments)    Angioedema    Antimicrobials this admission: 2/8 cefepime >>  Dose adjustments this admission:   Microbiology results: 2/7 BCx:  2/7 UCx:    Thank you for allowing pharmacy to be a part of this patient's care.  Dolly Rias RPh 09/27/2022, 12:42 AM

## 2022-09-27 NOTE — Progress Notes (Addendum)
Received a call from bedside RN regarding recurrent hypoglycemia after correction.  Presented at bedside.  Patient is resting with POA at bedside.  D50 x 1 ordered for serum glucose of 62.  Due to recurrence of hypoglycemia after receiving 5 units of long-acting insulin earlier, started D10 at 30 cc/h x 6 hours.  CBG every hour x 4 occurrences, until blood sugar is stable.  30 mEq IV KCl ordered for serum potassium of 3.0. 30 mmol K-Phos in D5 infusion ordered for phosphorus of 1.9.  Repeating BMP and phosphorus level at 11 AM.   Time: 15 minutes

## 2022-09-28 ENCOUNTER — Ambulatory Visit: Payer: PPO | Admitting: Cardiology

## 2022-09-28 ENCOUNTER — Inpatient Hospital Stay (HOSPITAL_COMMUNITY): Payer: PPO

## 2022-09-28 DIAGNOSIS — K219 Gastro-esophageal reflux disease without esophagitis: Secondary | ICD-10-CM

## 2022-09-28 DIAGNOSIS — R1310 Dysphagia, unspecified: Secondary | ICD-10-CM

## 2022-09-28 DIAGNOSIS — N1832 Chronic kidney disease, stage 3b: Secondary | ICD-10-CM

## 2022-09-28 DIAGNOSIS — J690 Pneumonitis due to inhalation of food and vomit: Secondary | ICD-10-CM | POA: Diagnosis not present

## 2022-09-28 DIAGNOSIS — I5033 Acute on chronic diastolic (congestive) heart failure: Secondary | ICD-10-CM | POA: Diagnosis not present

## 2022-09-28 DIAGNOSIS — G9341 Metabolic encephalopathy: Secondary | ICD-10-CM | POA: Diagnosis not present

## 2022-09-28 DIAGNOSIS — E875 Hyperkalemia: Secondary | ICD-10-CM

## 2022-09-28 DIAGNOSIS — E1122 Type 2 diabetes mellitus with diabetic chronic kidney disease: Secondary | ICD-10-CM

## 2022-09-28 DIAGNOSIS — N390 Urinary tract infection, site not specified: Principal | ICD-10-CM

## 2022-09-28 DIAGNOSIS — B964 Proteus (mirabilis) (morganii) as the cause of diseases classified elsewhere: Secondary | ICD-10-CM

## 2022-09-28 DIAGNOSIS — I10 Essential (primary) hypertension: Secondary | ICD-10-CM

## 2022-09-28 LAB — RENAL FUNCTION PANEL
Albumin: 2.8 g/dL — ABNORMAL LOW (ref 3.5–5.0)
Anion gap: 9 (ref 5–15)
BUN: 22 mg/dL (ref 8–23)
CO2: 22 mmol/L (ref 22–32)
Calcium: 7.9 mg/dL — ABNORMAL LOW (ref 8.9–10.3)
Chloride: 101 mmol/L (ref 98–111)
Creatinine, Ser: 1.42 mg/dL — ABNORMAL HIGH (ref 0.44–1.00)
GFR, Estimated: 35 mL/min — ABNORMAL LOW (ref >60)
Glucose, Bld: 217 mg/dL — ABNORMAL HIGH (ref 70–99)
Phosphorus: 2.9 mg/dL (ref 2.5–4.6)
Potassium: 4.6 mmol/L (ref 3.5–5.1)
Sodium: 132 mmol/L — ABNORMAL LOW (ref 135–145)

## 2022-09-28 LAB — BASIC METABOLIC PANEL
Anion gap: 11 (ref 5–15)
BUN: 24 mg/dL — ABNORMAL HIGH (ref 8–23)
CO2: 23 mmol/L (ref 22–32)
Calcium: 7.7 mg/dL — ABNORMAL LOW (ref 8.9–10.3)
Chloride: 97 mmol/L — ABNORMAL LOW (ref 98–111)
Creatinine, Ser: 1.5 mg/dL — ABNORMAL HIGH (ref 0.44–1.00)
GFR, Estimated: 33 mL/min — ABNORMAL LOW (ref >60)
Glucose, Bld: 360 mg/dL — ABNORMAL HIGH (ref 70–99)
Potassium: 5.4 mmol/L — ABNORMAL HIGH (ref 3.5–5.1)
Sodium: 131 mmol/L — ABNORMAL LOW (ref 135–145)

## 2022-09-28 LAB — GLUCOSE, CAPILLARY
Glucose-Capillary: 352 mg/dL — ABNORMAL HIGH (ref 70–99)
Glucose-Capillary: 218 mg/dL — ABNORMAL HIGH (ref 70–99)
Glucose-Capillary: 176 mg/dL — ABNORMAL HIGH (ref 70–99)
Glucose-Capillary: 218 mg/dL — ABNORMAL HIGH (ref 70–99)
Glucose-Capillary: 174 mg/dL — ABNORMAL HIGH (ref 70–99)

## 2022-09-28 LAB — CBC
HCT: 29.1 % — ABNORMAL LOW (ref 36.0–46.0)
Hemoglobin: 8.6 g/dL — ABNORMAL LOW (ref 12.0–15.0)
MCH: 27.7 pg (ref 26.0–34.0)
MCHC: 29.6 g/dL — ABNORMAL LOW (ref 30.0–36.0)
MCV: 93.9 fL (ref 80.0–100.0)
Platelets: 201 10*3/uL (ref 150–400)
RBC: 3.1 MIL/uL — ABNORMAL LOW (ref 3.87–5.11)
RDW: 14.6 % (ref 11.5–15.5)
WBC: 16.9 10*3/uL — ABNORMAL HIGH (ref 4.0–10.5)
nRBC: 0 % (ref 0.0–0.2)

## 2022-09-28 LAB — PHOSPHORUS: Phosphorus: 4 mg/dL (ref 2.5–4.6)

## 2022-09-28 LAB — C-REACTIVE PROTEIN: CRP: 7.3 mg/dL — ABNORMAL HIGH (ref <1.0)

## 2022-09-28 MED ORDER — ALBUTEROL SULFATE HFA 108 (90 BASE) MCG/ACT IN AERS: 2.0000 | INHALATION_SPRAY | RESPIRATORY_TRACT | Status: DC | PRN

## 2022-09-28 MED ORDER — FOOD THICKENER (SIMPLYTHICK): 1.0000 | ORAL | Status: DC | PRN

## 2022-09-28 MED ORDER — INSULIN GLARGINE-YFGN 100 UNIT/ML ~~LOC~~ SOLN: 10.0000 [IU] | Freq: Every day | SUBCUTANEOUS | Status: DC

## 2022-09-28 MED ORDER — FUROSEMIDE 20 MG PO TABS
10.0000 mg | ORAL_TABLET | Freq: Every day | ORAL | Status: DC
  Administered 2022-09-28 – 2022-09-29 (×2): 10 mg via ORAL
  Filled 2022-09-28 (×2): qty 1

## 2022-09-28 MED ORDER — TRANDOLAPRIL 2 MG PO TABS: 4.0000 mg | ORAL_TABLET | Freq: Every day | ORAL | Status: DC

## 2022-09-28 MED ORDER — SODIUM CHLORIDE 0.9 % IV SOLN
1.0000 g | INTRAVENOUS | Status: DC
  Administered 2022-09-28: 1 g via INTRAVENOUS
  Filled 2022-09-28 (×2): qty 10

## 2022-09-28 MED ORDER — FLUTICASONE PROPIONATE 50 MCG/ACT NA SUSP
2.0000 | Freq: Every day | NASAL | Status: DC
  Administered 2022-09-28 – 2022-10-07 (×10): 2 via NASAL
  Filled 2022-09-28 (×2): qty 16

## 2022-09-28 MED ORDER — METRONIDAZOLE 500 MG/100ML IV SOLN
500.0000 mg | Freq: Two times a day (BID) | INTRAVENOUS | Status: DC
  Administered 2022-09-28 – 2022-09-29 (×2): 500 mg via INTRAVENOUS
  Filled 2022-09-28 (×2): qty 100

## 2022-09-28 MED ORDER — MELATONIN 5 MG PO TABS
10.0000 mg | ORAL_TABLET | Freq: Every day | ORAL | Status: DC
  Administered 2022-09-28 – 2022-10-07 (×8): 10 mg via ORAL
  Filled 2022-09-28 (×8): qty 2

## 2022-09-28 MED ORDER — ADULT MULTIVITAMIN W/MINERALS CH
1.0000 | ORAL_TABLET | Freq: Every day | ORAL | Status: DC
  Administered 2022-09-28 – 2022-10-08 (×9): 1 via ORAL
  Filled 2022-09-28 (×9): qty 1

## 2022-09-28 MED ORDER — INSULIN GLARGINE-YFGN 100 UNIT/ML ~~LOC~~ SOLN
8.0000 [IU] | Freq: Every day | SUBCUTANEOUS | Status: DC
  Administered 2022-09-28 – 2022-09-29 (×2): 8 [IU] via SUBCUTANEOUS
  Filled 2022-09-28 (×2): qty 0.08

## 2022-09-28 MED ORDER — INSULIN ASPART 100 UNIT/ML IJ SOLN
3.0000 [IU] | Freq: Three times a day (TID) | INTRAMUSCULAR | Status: DC
  Administered 2022-09-28 – 2022-09-29 (×5): 3 [IU] via SUBCUTANEOUS

## 2022-09-28 NOTE — Assessment & Plan Note (Addendum)
During first several days of hospitalization control was quite brittle however glycemic control seems to have improved greatly in the past 24 to 48 hours. Now, continuing basal bolus insulin therapy to 5 units of NovoLog before every meal and 12 units of Semglee daily Accu-Cheks before every meal and nightly with sliding scale insulin Hemoglobin A1c 9.7% Patient's numerous oral hypoglycemics have been held.  This type of patient would best be served by insulin and not long-acting oral hypoglycemics.

## 2022-09-28 NOTE — Hospital Course (Addendum)
89 y.o. female with medical history significant for prior stroke with left-sided hemiparesis and dysphagia, chronic diastolic CHF (echo [id number] EF 65-70% with G2DD), nocturnal hypoxemia, recent diagnosis of UTI and on antibiotics, history of CVA with left-sided weakness, laryngopharyngeal reflux disease, pharyngoesophageal dysphagia, bilateral chronic otitis media with effusion status post prior ear tube placement, physical deconditioning, chronic anxiety/depression, mobility impairment, chronic respiratory failure with hypoxia, who presented to [facility] ED from her PCP office due to lethargy.  Of note, patient had a recent fall at home resulting in low back pain.  Furthermore, the patient had recently been diagnosed with a urinary tract infection on 2/3 via urgent care with urine cultures growing out Proteus.  Patient has been treated in the outpatient setting with Keflex and Levaquin.  Upon evaluation in the emergency department patient clinically was felt to be suffering from acute cardiogenic volume overload.  A one-time dose of intravenous Lasix was administered.  Due to patient's continued confusion the hospitalist group was then called to assess the patient for admission the hospital.  Upon admission to the hospital encephalopathy workup was expanded.  Patient was initially placed on intravenous antibiotics with ceftriaxone and Flagyl for suspected aspiration pneumonia as well as recent diagnosis of Proteus urinary tract infection on 2/3.  Repeat urine culture on 2/7 grew out Enterococcus sensitive to linezolid and therefore antibiotic regimen was transitioned to a combination of intravenous Unasyn and linezolid.   Hospital course has been complicated by multiple episodes of aspiration.  Patient was evaluated by MBS on 2/12 confirming degree of dysphagia and this patient.  Dysphagia 1 diet with nectar thickened liquids recommended.
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
## 2022-09-28 NOTE — Assessment & Plan Note (Signed)
Significant witnessed aspiration event by nursing this morning Chest x-ray additionally reveals bilateral lower lobe infiltrates thought to possibly be secondary to developing aspiration pneumonia. Initiating antibiotic coverage with intravenous ceftriaxone and Flagyl

## 2022-09-28 NOTE — Inpatient Diabetes Management (Signed)
Inpatient Diabetes Program Recommendations  AACE/ADA: New Consensus Statement on Inpatient Glycemic Control (2015)  Target Ranges:  Prepandial:   less than 140 mg/dL      Peak postprandial:   less than 180 mg/dL (1-2 hours)      Critically ill patients:  140 - 180 mg/dL    Latest Reference Range & Units 09/26/22 14:57 09/26/22 23:17 09/27/22 01:47 09/27/22 03:55 09/27/22 04:13 09/27/22 04:59 09/27/22 05:20  Glucose-Capillary 70 - 99 mg/dL 512 (HH) 519 (HH)  15 units Novolog @2344$  249 (H)  10 units Novolog  5 units Semglee @0202$  29 (LL) 154 (H) 48 (L) 131 (H)  (HH): Data is critically high (LL): Data is critically low (H): Data is abnormally high (L): Data is abnormally low  Latest Reference Range & Units 09/27/22 05:41 09/27/22 06:24 09/27/22 06:56 09/27/22 08:18 09/27/22 12:24 09/27/22 17:30 09/27/22 18:17 09/27/22 19:59  Glucose-Capillary 70 - 99 mg/dL 102 (H) 62 (L) 146 (H) 136 (H) 355 (H)  9 units Novolog  69 (L) 96 255 (H)  (H): Data is abnormally high (L): Data is abnormally low  Latest Reference Range & Units 09/28/22 07:31  Glucose-Capillary 70 - 99 mg/dL 352 (H)  (H): Data is abnormally high     Home DM Meds: Glipizide 2.5 mg daily         Actos 30 mg daily        Januvia 100 mg daily  Current Orders: Novolog Sensitive Correction Scale/ SSI (0-9 units) TID AC      Novolog 3 units TID with meals         Semglee 8 units Daily   Note CBG 352 this AM  Semglee and Novolog meal coverage added to in-hospital regimen  Semglee will start this AM--Meal coverage to start at Lunch    --Will follow patient during hospitalization--  Wyn Quaker RN, MSN, [place] Diabetes Coordinator Inpatient Glycemic Control Team Team Pager: [phone] (8a-5p)

## 2022-09-28 NOTE — Assessment & Plan Note (Addendum)
Witnessed episode of aspiration this morning Clinical concern for aspiration pneumonia as well with antibiotic management as noted above

## 2022-09-28 NOTE — Assessment & Plan Note (Addendum)
Urine culture on 2/3 growing out Proteus mirabilis.  Urine culture on 2/7 growing out Enterococcus faecium Intravenous antibiotics have been adjusted as noted above Will provide patient with at least 5 days of antibiotics

## 2022-09-28 NOTE — Progress Notes (Signed)
Notified MD that patient is visibly coughing while eating while RN in the room. RN asked patient's caregiver to hold off on feeding so patient can be further assessed to ensure patient is not aspirating.

## 2022-09-28 NOTE — Assessment & Plan Note (Signed)
Resolved

## 2022-09-28 NOTE — Evaluation (Signed)
Clinical/Bedside Swallow Evaluation Patient Details  Name: Renee [name] MRN: [id number] Date of Birth: 1928/10/11  Today's Date: 09/28/2022 Time: SLP Start Time (ACUTE ONLY): 65 SLP Stop Time (ACUTE ONLY): 1138 SLP Time Calculation (min) (ACUTE ONLY): 33 min  Past Medical History:  Past Medical History:  Diagnosis Date   Angioedema    possibly from voltaren   Bronchopneumonia 12/11/2016   Degenerative disc disease    Diabetes mellitus    type II   Hyperlipidemia    Hypertension    LVH (left ventricular hypertrophy)    and atrial enlargement by echo in past with nl EF   Nasal pruritis    Osteoarthritis    Osteopenia    Renal insufficiency    Sleep apnea    Stroke (Renee [name]) 05/2010   Small vessel sobcortical (in Point trial) with Dr Leonie Man, residual L hemiparesis   Vitamin B 12 deficiency 04/08   Past Surgical History:  Past Surgical History:  Procedure Laterality Date   ABDOMINAL HYSTERECTOMY     BSO-fibroids   APPENDECTOMY     BACK SURGERY     COLON SURGERY     due to punctured intestines   EYE SURGERY     cataract extraction   IR THORACENTESIS ASP PLEURAL SPACE W/IMG GUIDE  06/07/2020   KNEE SURGERY     arthroscope   PARS PLANA VITRECTOMY  07/31/2011   Procedure: PARS PLANA VITRECTOMY WITH 25 GAUGE;  Surgeon: Renee Pedro, MD;  Location: [place];  Service: Ophthalmology;  Laterality: Right;  REMOVAL OF SILICONE OIL AND LASER RIGHT EYE   RETINAL DETACHMENT SURGERY  02/18/11   times 2   SPINE SURGERY  08/09   spinal decompression surgery   HPI:  Renee [name] is a 89 y.o. female with medical history significant for chronic diastolic CHF, nocturnal hypoxemia, recent diagnosis of UTI and on antibiotics, history of CVA with left-sided weakness, laryngopharyngeal reflux disease, pharyngoesophageal dysphagia, bilateral chronic otitis media with effusion status post prior ear tube placement, physical deconditioning, chronic anxiety/depression, mobility impairment, chronic  respiratory failure with hypoxia, who presented to [name] ED from her PCP due to lethargy and hypersomnolence. 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 Pna vs atx, ct brain negative for acute, Stable encephalomalacia within the right basal ganglia consistent with prior infarct. pt coughing with po    Assessment / Plan / Recommendation  Clinical Impression  Pt continues to demonstrate clinical indications of oropharyngeal and suspected esophageal deficits.  Caregiver, Renee [name], in the room and educated significantly to findings/recommendations. Reviewed prior MBS that showed pt had difficulties with swallowing timing, laryngeal closure,elevation allowing penetration of thin. She did not aspirate and did not cough on that test. Concern was present for multifactorial dysphagia including oropharyngeal and esophageal - and pt was maintained on dys3/thin diet.   At this time, pt is overtly coughing with thin liquids now which appears very uncomfortable. She demonstrates excesive oral movement, ? tardive dyskinesia- certainly impacting oral transing/bolus control.  .  Educated pt and her caregiver, Renee [name], to recommendations and SLP goal is to mitigate aspiration - but not prevent it.   Recommend dys3/nectar diet  - using puree to transit food from mouth into throat-  allowing ice chips and tsps of water at any time as well as [employer] protocol for hydration between meals after mouth care as tolerated.      Recommend consider palliative consult given  pt's multiple co morbidities, chronic dysphagia with increased risk of adverse events.  Small frequent meals advised. SLP Visit Diagnosis: Dysphagia, oropharyngeal phase (R13.12);Dysphagia, pharyngoesophageal phase (R13.14)    Aspiration Risk  Moderate aspiration risk    Diet Recommendation Dysphagia 3 (Mech soft);Nectar-thick liquid (water protocol)   Medication Administration: Whole  meds with puree Supervision: Full supervision/cueing for compensatory strategies Compensations: Slow rate;Small sips/bites;Other (Comment) (use puree to transit food into pharynx - not liquids) Postural Changes: Remain upright for at least 30 minutes after po intake;Seated upright at 90 degrees    Other  Recommendations Oral Care Recommendations: Oral care BID    Recommendations for follow up therapy are one component of a multi-disciplinary discharge planning process, led by the attending physician.  Recommendations may be updated based on patient status, additional functional criteria and insurance authorization.  Follow up Recommendations No SLP follow up      Assistance Recommended at Discharge    Functional Status Assessment Patient has had a recent decline in their functional status and/or demonstrates limited ability to make significant improvements in function in a reasonable and predictable amount of time  Frequency and Duration min 2x/week  1 week       Prognosis Prognosis for improved oropharyngeal function: Fair Barriers to Reach Goals: Time post onset      Swallow Study   General HPI: [name] is a 89 y.o. female with medical history significant for chronic diastolic CHF, nocturnal hypoxemia, recent diagnosis of UTI and on antibiotics, history of CVA with left-sided weakness, laryngopharyngeal reflux disease, pharyngoesophageal dysphagia, bilateral chronic otitis media with effusion status post prior ear tube placement, physical deconditioning, chronic anxiety/depression, mobility impairment, chronic respiratory failure with hypoxia, who presented to [name] ED from her PCP due to lethargy and hypersomnolence. 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 Pna vs atx, ct brain negative for acute, Stable encephalomalacia within the right basal ganglia consistent with prior infarct. pt coughing with  po Type of Study: Bedside Swallow Evaluation Previous Swallow Assessment: MBS  see HPI Diet Prior to this Study: NPO Temperature Spikes Noted: No Respiratory Status: Nasal cannula History of Recent Intubation: No Behavior/Cognition: Alert;Distractible;Requires cueing;Doesn't follow directions Oral Cavity Assessment: Dry Oral Care Completed by SLP: No Oral Cavity - Dentition: Adequate natural dentition Vision: Impaired for self-feeding Self-Feeding Abilities: Total assist Patient Positioning: Upright in bed Baseline Vocal Quality: Low vocal intensity;Suspected CN X (Vagus) involvement Volitional Cough: Cognitively unable to elicit Volitional Swallow: Unable to elicit    Oral/Motor/Sensory Function Overall Oral Motor/Sensory Function: Mild impairment Facial ROM: Reduced left Facial Symmetry: Abnormal symmetry left Facial Strength: Reduced left Facial Sensation: Reduced left Lingual ROM: Reduced left Lingual Symmetry: Abnormal symmetry left Lingual Strength: Reduced Lingual Sensation: Reduced Velum: Other (comment) (could not view) Mandible: Impaired   Ice Chips Ice chips: Impaired Oral Phase Impairments: Reduced lingual movement/coordination   Thin Liquid Thin Liquid: Impaired Presentation: Cup;Spoon;Straw Oral Phase Impairments: Reduced lingual movement/coordination Oral Phase Functional Implications: Prolonged oral transit Pharyngeal  Phase Impairments: Cough - Immediate;Suspected delayed Swallow    Nectar Thick Nectar Thick Liquid: Impaired Presentation: Spoon;Straw Oral Phase Impairments: Reduced lingual movement/coordination Oral phase functional implications: Prolonged oral transit Pharyngeal Phase Impairments: Cough - Delayed;Suspected delayed Swallow   Honey Thick Honey Thick Liquid: Not tested   Puree Puree: Impaired Presentation: Spoon Oral Phase Impairments: Reduced lingual movement/coordination Oral Phase Functional Implications: Prolonged oral  transit Pharyngeal Phase Impairments: Suspected delayed  Swallow   Solid     Solid: Impaired Oral Phase Impairments: Reduced lingual movement/coordination;Impaired mastication Oral Phase Functional Implications: Impaired mastication;Prolonged oral transit;Oral residue      Macario Golds 09/28/2022,12:31 PM  Kathleen Lime, MS [place] Office [phone]

## 2022-09-28 NOTE — Progress Notes (Signed)
BSE completed, full report to follow.  Pt familiar to this SLP from prior evaluation/MBS in Sept 2023.  Reviewed prior MBS that showed pt had difficulties with swallowing timing, laryngeal closure,elevation allowing penetration of thin. She did not aspirate and did not cough. Concern was present for multifactorial dysphagia including oropharyngeal and esophageal - and pt was maintained on dys3/thin diet.    At this time, pt is overtly coughing with thin liquids now which appears very uncomfortable.  Educated pt and her caregiver, Renee [name], to recommendations and SLP goal is to mitigate aspiration - but not prevent it.   Recommend dys3/nectar diet - allowing ice chips and tsps of water at any time as well as [employer] protocol for hydration between meals after mouth care as tolerated.   Recommend consider palliative consult given pt's multiple co morbidities, chronic dysphagia with increased risk of adverse events.      Renee Lime, MS [place] Office [phone]

## 2022-09-28 NOTE — Assessment & Plan Note (Signed)
Continuing home regimen of daily PPI therapy.  

## 2022-09-28 NOTE — Assessment & Plan Note (Addendum)
Relaxed blood pressure targets due to advanced age. Continue amlodipine 10 mg daily As needed intravenous antihypertensives for markedly elevated blood pressures.

## 2022-09-28 NOTE — Progress Notes (Signed)
PROGRESS NOTE   Renee [name]  [id number] DOB: 1929/01/21 DOA: 09/26/2022 PCP: Girtha Rm, NP-C   Date of Service: the patient was seen and examined on 09/28/2022  Brief Narrative:  89 y.o. female with medical history significant for prior stroke with left-sided hemiparesis and dysphagia, chronic diastolic CHF (echo [id number] EF 65-70% with G2DD), nocturnal hypoxemia, recent diagnosis of UTI and on antibiotics, history of CVA with left-sided weakness, laryngopharyngeal reflux disease, pharyngoesophageal dysphagia, bilateral chronic otitis media with effusion status post prior ear tube placement, physical deconditioning, chronic anxiety/depression, mobility impairment, chronic respiratory failure with hypoxia, who presented to [facility] ED from her PCP office due to lethargy.  Of note, patient had a recent fall at home resulting in low back pain.  Furthermore, the patient had recently been diagnosed with a urinary tract infection on 2/3 via urgent care with urine cultures growing out Proteus.  Patient has been treated in the outpatient setting with Keflex and Levaquin.  Upon evaluation in the emergency department patient clinically was felt to be suffering from acute cardiogenic volume overload.  A one-time dose of intravenous Lasix was administered.  Chest x-ray revealed no pulmonary edema.  Due to patient's continued confusion the hospitalist group was then called to assess the patient for admission the hospital.  Upon admission to the hospital encephalopathy workup was expanded.  Furthermore, patient's recently treated Proteus mirabilis urinary tract infection was treated with intravenous antibiotic therapy based on recent sensitivities.     Assessment and Plan: * Acute metabolic encephalopathy Some clinical improvement noted although patient is continuing to exhibit bouts of confusion, decreased sensorium as well as bouts of agitation Discontinuing cefepime as this by itself can cause  confusion.  Furthermore, levofloxacin that the patient was on prior to hospitalization can also cause confusion. Placing patient on alternative antibiotic for the Proteus urinary tract infection with ceftriaxone and adding intravenous metronidazole due to concerns for possible concurrent aspiration pneumonia. Placing patient on melatonin nightly to regulate sleep cycle and hopefully minimize bouts of extreme agitation at night Judicious usage of as needed low-dose Haldol for bouts of extreme agitation  The patient fails to exhibit clinical improvement we will consider EEG monitoring and MRI  Urinary tract infection due to Proteus Intravenous antibiotics have been adjusted as noted above Will provide patient with at least 5 days of antibiotics  Aspiration pneumonia of both lower lobes due to gastric secretions ([place]) Significant witnessed aspiration event by nursing this morning Chest x-ray additionally reveals bilateral lower lobe infiltrates thought to possibly be secondary to developing aspiration pneumonia. Initiating antibiotic coverage with intravenous ceftriaxone and Flagyl  Acute on chronic diastolic CHF (congestive heart failure) ([place]) Initial clinical concern for acute congestive heart failure in the emergency department, dose of intravenous Lasix was administered Patient is seemingly near euvolemia at this point Obtaining repeat BNP  Hyperkalemia Mild hyperkalemia on chemistry this morning Conservative management Monitoring potassium levels with serial chemistries  Type 2 diabetes mellitus with stage 3b chronic kidney disease, without long-term current use of insulin (HCC) Brittle diabetic control throughout the hospitalization with both hyperglycemia and hypoglycemia Initiating low-dose basal bolus insulin therapy Accu-Cheks before every meal and nightly with sliding scale insulin Hemoglobin A1c 9.7% Patient's numerous oral hypoglycemics have been held  Dysphagia Witnessed  episode of aspiration this morning Clinical concern for aspiration pneumonia as well with antibiotic management as noted above  Chronic kidney disease, stage 3b (HCC) Strict intake and output monitoring Creatinine near baseline Minimizing nephrotoxic agents as much  as possible Serial chemistries to monitor renal function and electrolytes   Essential hypertension Continue amlodipine 10 mg daily As needed intravenous antihypertensives for markedly elevated blood pressures.  GERD without esophagitis Continuing home regimen of daily PPI therapy.         Subjective:  Patient unable to answer questions appropriately due to confusion.  Physical Exam:  Vitals:   09/28/22 0918 09/28/22 0922 09/28/22 1153 09/28/22 1620  BP:   (!) 132/45 (!) 161/66  Pulse:   74 71  Resp: 19  20   Temp:   (!) 97.5 F (36.4 C)   TempSrc:   Oral   SpO2: 99% 98% 100% 100%  Weight:      Height:         Constitutional: Lethargic but arousable, oriented x 3 no associated distress.  Patient is intermittently following commands. Skin: no rashes, no lesions, poor skin turgor noted. Eyes: Pupils are equally reactive to light.  No evidence of scleral icterus or conjunctival pallor.  ENMT: Slightly dry mucous membranes noted.  Posterior pharynx clear of any exudate or lesions.   Respiratory: Significant bibasilar rales without wheezing.  No evidence of respiratory distress or accessory muscle use. Cardiovascular: Regular rate and rhythm, no murmurs / rubs / gallops. No extremity edema. 2+ pedal pulses. No carotid bruits.  Abdomen: Abdomen is soft and nontender.  No evidence of intra-abdominal masses.  Positive bowel sounds noted in all quadrants.   Musculoskeletal: no contractures.  Poor muscle tone Neuro: Significant weakness of the left upper and lower extremities   Data Reviewed:  I have personally reviewed and interpreted labs, imaging.  Significant findings are   CBC: Recent Labs  Lab  09/26/22 1533 09/26/22 2350 09/27/22 0433 09/28/22 0357  WBC 8.5 8.8 15.5* 16.9*  NEUTROABS 6.8  --   --   --   HGB 9.9* 9.5* 9.2* 8.6*  HCT 33.1* 31.4* 30.6* 29.1*  MCV 93.2 92.9 92.7 93.9  PLT 207 195 236 [id number]   Basic Metabolic Panel: Recent Labs  Lab 09/26/22 1533 09/26/22 2350 09/27/22 0433 09/27/22 1032 09/28/22 0357 09/28/22 1224  NA 132*  --  137 135 131* 132*  K 3.8  --  3.0* 4.6 5.4* 4.6  CL 94*  --  99 95* 97* 101  CO2 29  --  26 27 23 22  $ GLUCOSE 509*  --  95 223* 360* 217*  BUN 21  --  22 22 24* 22  CREATININE 1.47* 1.53* 1.27* 1.27* 1.50* 1.42*  CALCIUM 8.5*  --  8.7* 8.5* 7.7* 7.9*  MG  --   --  1.9  --   --   --   PHOS  --   --  1.9* 4.5 4.0 2.9   GFR: Estimated Creatinine Clearance: 24.9 mL/min (A) (by C-G formula based on SCr of 1.42 mg/dL (H)). Liver Function Tests: Recent Labs  Lab 09/26/22 1533 09/28/22 1224  AST 26  --   ALT 25  --   ALKPHOS 324*  --   BILITOT 0.8  --   PROT 6.7  --   ALBUMIN 3.6 2.8*     Telemetry: Personally reviewed.  Rhythm is normal sinus rhythm   Code Status:  Full code.  Code status decision has been confirmed with: POA on admission Family Communication:  [name] the patient POA as well as sister and nephew have been updated on plan of care at the bedside.   Severity of Illness:  The appropriate patient status for this  patient is INPATIENT. Inpatient status is judged to be reasonable and necessary in order to provide the required intensity of service to ensure the patient's safety. The patient's presenting symptoms, physical exam findings, and initial radiographic and laboratory data in the context of their chronic comorbidities is felt to place them at high risk for further clinical deterioration. Furthermore, it is not anticipated that the patient will be medically stable for discharge from the hospital within 2 midnights of admission.   * I certify that at the point of admission it is my clinical judgment that  the patient will require inpatient hospital care spanning beyond 2 midnights from the point of admission due to high intensity of service, high risk for further deterioration and high frequency of surveillance required.*  Time spent:  70 minutes  Author:  Vernelle Emerald MD  09/28/2022 8:05 PM

## 2022-09-28 NOTE — Assessment & Plan Note (Signed)
Strict intake and output monitoring Creatinine near baseline Minimizing nephrotoxic agents as much as possible Serial chemistries to monitor renal function and electrolytes  

## 2022-09-28 NOTE — Assessment & Plan Note (Addendum)
Patient's respiratory failure thought to be at least in part due to some degree of acute on chronic diastolic congestive heart failure Intermittent doses of intravenous Lasix being administered Last echocardiogram reveals preserved ejection fraction with grade 2 diastolic dysfunction

## 2022-09-28 NOTE — Assessment & Plan Note (Signed)
Some clinical improvement since yesterday with improving mentation Baseline mentation unclear Suspected etiology is multiple sources of infection including aspiration pneumonia and urinary tract infection Adjusting intravenous antibiotics to include intravenous Unasyn and linezolid covering for possibilities of aspiration pneumonia (multiple episodes of witnessed aspiration) with growth Proteus and Enterococcus faecium growing from separate urine cultures. On melatonin nightly to regulate sleep cycle and  minimize bouts of extreme agitation at night Judicious usage of as needed low-dose Haldol for bouts of extreme agitation

## 2022-09-28 NOTE — Evaluation (Signed)
Occupational Therapy Evaluation Patient Details Name: Renee [name] MRN: [id number] DOB: April 03, 1929 Today's Date: 09/28/2022   History of Present Illness Renee [name] Channing is a 89 y.o. female admitted with acute metabolic encephalopathy. CT head with no acute intracranial abnormality. Noted significant blood sugar fluctuation tPMH: diabetes, CKD, CHF, previous stroke with chronic left-sided weakness, HTN, hyperlipidemia, and currently on antibiotics for urinary tract infection   Clinical Impression   Renee [name] is a 89 year old woman who presents today with confusion, restlessness, chronic left sided hemiparesis, generalized weakness, poor activity tolerance, and impaired balance. Per her caregiver -- approximately two weeks ago she could stand and walk with walker with min assist and assist with ADLs. Today patient presents supine in bed with soft mittens. She is restless and pulling at her gown and telemetry. She is grossly oriented stating she is in the hospital and knows the year - but not the month. She was max assist x 2 to transfer to edge of bed and mod assist to maintain sitting balance. She is leaning and grasping to the bed rail on the right. Therapist attempted to get her to work on her sitting balance but she would not stop going for the rail. She requested to lay back down and said "yes" when therapist asked if she was dizzy. She continued to grasp for the rail in supine - as if she was experiencing balance deficits even in bed. Therapist reapplied safety mittens. She is max-total assist at bed level for ADLs currently.  Patient is currently below her baseline abilities and requires significant physical assistance - both of her caregivers are also elderly. Therapist recommends short term rehab at discharge as the safest and most appropriate disposition at this time. Caregiver is agreeable to this and also hopes that if she improves she could go home with [facility]. If patient was to discharge  home - at current physical level - she would need a hospital bed. She has been sleeping in a lift chair.      Recommendations for follow up therapy are one component of a multi-disciplinary discharge planning process, led by the attending physician.  Recommendations may be updated based on patient status, additional functional criteria and insurance authorization.   Follow Up Recommendations  Skilled nursing-short term rehab (<3 hours/day)     Assistance Recommended at Discharge Frequent or constant Supervision/Assistance  Patient can return home with the following Two people to help with walking and/or transfers;A lot of help with bathing/dressing/bathroom;Assistance with cooking/housework;Assist for transportation;Help with stairs or ramp for entrance    Functional Status Assessment  Patient has had a recent decline in their functional status and/or demonstrates limited ability to make significant improvements in function in a reasonable and predictable amount of time  Equipment Recommendations  None recommended by OT    Recommendations for Other Services       Precautions / Restrictions Precautions Precautions: Fall Restrictions Weight Bearing Restrictions: No      Mobility Bed Mobility   Bed Mobility: Supine to Sit, Sit to Supine     Supine to sit: Max assist, HOB elevated, +2 for physical assistance Sit to supine: Max assist, +2 for physical assistance   General bed mobility comments: Max A to bring BLE to EOB and max A to upright trunk into sitting EOB, pt with poor sitting balance needing mod A to maintain static sitting - patient leaning to the right and erratically grasping the bed rail. Asked to return to supine and agreed  that she was dizzy when therapist assisted. Also consistently grabbing the rail in supine as if to steady heself.    Transfers                   General transfer comment: unable      Balance Overall balance assessment: Needs  assistance Sitting-balance support: Single extremity supported, Feet supported Sitting balance-Leahy Scale: Poor   Postural control: Right lateral lean                                 ADL either performed or assessed with clinical judgement   ADL Overall ADL's : Needs assistance/impaired Eating/Feeding: Maximal assistance;Bed level Eating/Feeding Details (indicate cue type and reason): being fed by family Grooming: Maximal assistance;Bed level   Upper Body Bathing: Maximal assistance;Bed level   Lower Body Bathing: Total assistance;Bed level   Upper Body Dressing : Maximal assistance;Bed level   Lower Body Dressing: Total assistance;Bed level     Toilet Transfer Details (indicate cue type and reason): unable - would be total hoyer lift to [facility] Toileting- Clothing Manipulation and Hygiene: Total assistance;+2 for physical assistance       Functional mobility during ADLs: Maximal assistance;+2 for physical assistance       Vision Baseline Vision/History: 1 Wears glasses Vision Assessment?: No apparent visual deficits     Perception     Praxis      Pertinent Vitals/Pain Pain Assessment Pain Assessment: No/denies pain     Hand Dominance Right   Extremity/Trunk Assessment Upper Extremity Assessment Upper Extremity Assessment: RUE deficits/detail;LUE deficits/detail;Generalized weakness RUE Deficits / Details: WFL ROM unabel to test strength, suspect weaker than normal, patient restless and pullng at clothing and telemetry LUE Deficits / Details: Impaired ROM and strength. Not functional. No purposeful movement noted on left side. LUE Coordination: decreased fine motor;decreased gross motor   Lower Extremity Assessment Lower Extremity Assessment: Defer to PT evaluation   Cervical / Trunk Assessment Cervical / Trunk Assessment: Kyphotic   Communication Communication Communication: No difficulties   Cognition Arousal/Alertness: Awake/alert Behavior  During Therapy: Restless, Flat affect Overall Cognitive Status: Difficult to assess                                 General Comments: Able to state she is in the hospital. Unable to state who her sister is. Does state her caregivers name. States it is March but knows the year is 2024.     General Comments       Exercises     Shoulder Instructions      Home Living Family/patient expects to be discharged to:: Private residence Living Arrangements: Other (Comment) (caregiver) Available Help at Discharge: Personal care attendant;Available 24 hours/day Type of Home: House Home Access: Ramped entrance     Home Layout: One level     Bathroom Shower/Tub: Hospital doctor Toilet: Handicapped height     Home Equipment: Advice worker (2 wheels);Transport chair;BSC/3in1;Grab bars - tub/shower;Rollator (4 wheels);Other (comment) (hoyer lift)   Additional Comments: caregiver reports she moved in with pt in Jan and is present 24/7      Prior Functioning/Environment Prior Level of Function : Needs assist             Mobility Comments: caregivers reports pt amb with RW around the home ADLs Comments: caregiver reports she assists pt  as needed with self care. Reports patient able to assist with ADLs on right side.        OT Problem List: Decreased strength;Decreased activity tolerance;Impaired balance (sitting and/or standing);Decreased coordination;Decreased safety awareness;Decreased cognition;Decreased knowledge of use of DME or AE;Decreased knowledge of precautions;Obesity;Impaired UE functional use;Increased edema      OT Treatment/Interventions: Self-care/ADL training;Therapeutic exercise;Neuromuscular education;DME and/or AE instruction;Therapeutic activities;Balance training;Patient/family education    OT Goals(Current goals can be found in the care plan section) Acute Rehab OT Goals Patient Stated Goal: patient unable to state OT Goal  Formulation: With family Time For Goal Achievement: 10/12/22 Potential to Achieve Goals: Fair  OT Frequency: Min 2X/week    Co-evaluation              AM-PAC OT "6 Clicks" Daily Activity     Outcome Measure Help from another person eating meals?: A Lot Help from another person taking care of personal grooming?: A Lot Help from another person toileting, which includes using toliet, bedpan, or urinal?: Total Help from another person bathing (including washing, rinsing, drying)?: A Lot Help from another person to put on and taking off regular upper body clothing?: A Lot Help from another person to put on and taking off regular lower body clothing?: Total 6 Click Score: 10   End of Session Nurse Communication: Mobility status  Activity Tolerance: Treatment limited secondary to agitation Patient left: in bed;with call bell/phone within reach;with bed alarm set;with family/visitor present  OT Visit Diagnosis: Muscle weakness (generalized) (M62.81);Other symptoms and signs involving cognitive function                Time: 1315-1331 OT Time Calculation (min): 16 min Charges:  OT General Charges $OT Visit: 1 Visit OT Evaluation $OT Eval Low Complexity: 1 Low  Renee [name], OTR/L [facility]  Office [phone]   Lenward Chancellor 09/28/2022, 4:32 PM

## 2022-09-29 ENCOUNTER — Inpatient Hospital Stay (HOSPITAL_COMMUNITY): Payer: PPO

## 2022-09-29 DIAGNOSIS — J9601 Acute respiratory failure with hypoxia: Secondary | ICD-10-CM

## 2022-09-29 DIAGNOSIS — G9341 Metabolic encephalopathy: Secondary | ICD-10-CM | POA: Diagnosis not present

## 2022-09-29 DIAGNOSIS — N390 Urinary tract infection, site not specified: Secondary | ICD-10-CM | POA: Diagnosis not present

## 2022-09-29 DIAGNOSIS — J690 Pneumonitis due to inhalation of food and vomit: Secondary | ICD-10-CM | POA: Diagnosis not present

## 2022-09-29 LAB — URINE CULTURE: Culture: 20000 — AB

## 2022-09-29 LAB — COMPREHENSIVE METABOLIC PANEL
ALT: 20 U/L (ref 0–44)
AST: 22 U/L (ref 15–41)
Albumin: 2.6 g/dL — ABNORMAL LOW (ref 3.5–5.0)
Alkaline Phosphatase: 211 U/L — ABNORMAL HIGH (ref 38–126)
Anion gap: 11 (ref 5–15)
BUN: 23 mg/dL (ref 8–23)
CO2: 22 mmol/L (ref 22–32)
Calcium: 8 mg/dL — ABNORMAL LOW (ref 8.9–10.3)
Chloride: 104 mmol/L (ref 98–111)
Creatinine, Ser: 1.09 mg/dL — ABNORMAL HIGH (ref 0.44–1.00)
GFR, Estimated: 48 mL/min — ABNORMAL LOW (ref >60)
Glucose, Bld: 115 mg/dL — ABNORMAL HIGH (ref 70–99)
Potassium: 4.9 mmol/L (ref 3.5–5.1)
Sodium: 137 mmol/L (ref 135–145)
Total Bilirubin: 0.3 mg/dL (ref 0.3–1.2)
Total Protein: 5.2 g/dL — ABNORMAL LOW (ref 6.5–8.1)

## 2022-09-29 LAB — BLOOD GAS, ARTERIAL
Acid-Base Excess: 0.3 mmol/L (ref 0.0–2.0)
Bicarbonate: 26.5 mmol/L (ref 20.0–28.0)
Drawn by: 270211
O2 Content: 100 L/min
O2 Saturation: 99.9 %
Patient temperature: 37
pCO2 arterial: 48 mmHg (ref 32–48)
pH, Arterial: 7.35 (ref 7.35–7.45)
pO2, Arterial: 101 mmHg (ref 83–108)

## 2022-09-29 LAB — SEDIMENTATION RATE: Sed Rate: 55 mm/hr — ABNORMAL HIGH (ref 0–22)

## 2022-09-29 LAB — GLUCOSE, CAPILLARY
Glucose-Capillary: 163 mg/dL — ABNORMAL HIGH (ref 70–99)
Glucose-Capillary: 191 mg/dL — ABNORMAL HIGH (ref 70–99)
Glucose-Capillary: 257 mg/dL — ABNORMAL HIGH (ref 70–99)

## 2022-09-29 LAB — CBC WITH DIFFERENTIAL/PLATELET
Abs Immature Granulocytes: 0.16 10*3/uL — ABNORMAL HIGH (ref 0.00–0.07)
Basophils Absolute: 0.1 10*3/uL (ref 0.0–0.1)
Basophils Relative: 0 %
Eosinophils Absolute: 0.1 10*3/uL (ref 0.0–0.5)
Eosinophils Relative: 1 %
HCT: 29.6 % — ABNORMAL LOW (ref 36.0–46.0)
Hemoglobin: 8.8 g/dL — ABNORMAL LOW (ref 12.0–15.0)
Immature Granulocytes: 1 %
Lymphocytes Relative: 6 %
Lymphs Abs: 0.8 10*3/uL (ref 0.7–4.0)
MCH: 27.5 pg (ref 26.0–34.0)
MCHC: 29.7 g/dL — ABNORMAL LOW (ref 30.0–36.0)
MCV: 92.5 fL (ref 80.0–100.0)
Monocytes Absolute: 0.7 10*3/uL (ref 0.1–1.0)
Monocytes Relative: 5 %
Neutro Abs: 12 10*3/uL — ABNORMAL HIGH (ref 1.7–7.7)
Neutrophils Relative %: 87 %
Platelets: 208 10*3/uL (ref 150–400)
RBC: 3.2 MIL/uL — ABNORMAL LOW (ref 3.87–5.11)
RDW: 14.4 % (ref 11.5–15.5)
WBC: 13.9 10*3/uL — ABNORMAL HIGH (ref 4.0–10.5)
nRBC: 0 % (ref 0.0–0.2)

## 2022-09-29 LAB — MAGNESIUM: Magnesium: 1.8 mg/dL (ref 1.7–2.4)

## 2022-09-29 LAB — BRAIN NATRIURETIC PEPTIDE: B Natriuretic Peptide: 174.1 pg/mL — ABNORMAL HIGH (ref 0.0–100.0)

## 2022-09-29 LAB — PROCALCITONIN: Procalcitonin: 0.28 ng/mL

## 2022-09-29 LAB — C-REACTIVE PROTEIN: CRP: 8 mg/dL — ABNORMAL HIGH (ref <1.0)

## 2022-09-29 MED ORDER — LINEZOLID 600 MG/300ML IV SOLN
600.0000 mg | Freq: Two times a day (BID) | INTRAVENOUS | Status: DC
  Administered 2022-09-29 – 2022-10-01 (×5): 600 mg via INTRAVENOUS
  Filled 2022-09-29 (×6): qty 300

## 2022-09-29 MED ORDER — ENOXAPARIN SODIUM 40 MG/0.4ML IJ SOSY
40.0000 mg | PREFILLED_SYRINGE | Freq: Every day | INTRAMUSCULAR | Status: DC
  Administered 2022-09-29: 40 mg via SUBCUTANEOUS
  Filled 2022-09-29: qty 0.4

## 2022-09-29 MED ORDER — FUROSEMIDE 10 MG/ML IJ SOLN
40.0000 mg | Freq: Once | INTRAMUSCULAR | Status: AC
  Administered 2022-09-29: 40 mg via INTRAVENOUS
  Filled 2022-09-29: qty 4

## 2022-09-29 MED ORDER — SODIUM CHLORIDE 0.9 % IV SOLN
3.0000 g | Freq: Four times a day (QID) | INTRAVENOUS | Status: DC
  Administered 2022-09-30 (×2): 3 g via INTRAVENOUS
  Filled 2022-09-29 (×3): qty 8

## 2022-09-29 MED ORDER — INSULIN GLARGINE-YFGN 100 UNIT/ML ~~LOC~~ SOLN
10.0000 [IU] | Freq: Every day | SUBCUTANEOUS | Status: DC
  Administered 2022-09-30: 10 [IU] via SUBCUTANEOUS
  Filled 2022-09-29: qty 0.1

## 2022-09-29 MED ORDER — INSULIN ASPART 100 UNIT/ML IJ SOLN: 4.0000 [IU] | Freq: Three times a day (TID) | INTRAMUSCULAR | Status: DC

## 2022-09-29 NOTE — Significant Event (Signed)
Rapid Response Event Note   Reason for Call :  Resp Distress    Initial Focused Assessment:  Patient with increased WOB with increased oxygen demands, skin diaphoretic.   Interventions:  Albertol tx NRB  ABG  CXR  Plan of Care:  NRB wean to decrease oxygen demands.    Event Summary:   MD Notified: Dr Cyd Silence  Call Time: [place] Time: 1852  End Time: 1910   [place] Catheryne Deford, RN

## 2022-09-29 NOTE — Progress Notes (Signed)
Pharmacy Antibiotic Note  Renee [name] is a 89 y.o. female admitted on 09/26/2022 with  AMS .  Pharmacy has been consulted for Unasyn dosing for aspiration PNA  Plan: Unasyn 3 gr IV q6h  Monitor clinical course, renal function, cultures as available  Height: 5' 3"$  (160 cm) Weight: 74.5 kg (164 lb 3.9 oz) IBW/kg (Calculated) : 52.4  Temp (24hrs), Avg:98.4 F (36.9 C), Min:97.6 F (36.4 C), Max:98.8 F (37.1 C)  Recent Labs  Lab 09/26/22 1533 09/26/22 2005 09/26/22 2350 09/27/22 0433 09/27/22 1032 09/28/22 0357 09/28/22 1224 09/29/22 0540  WBC 8.5  --  8.8 15.5*  --  16.9*  --  13.9*  CREATININE 1.47*  --  1.53* 1.27* 1.27* 1.50* 1.42* 1.09*  LATICACIDVEN 1.0 1.1  --   --   --   --   --   --     Estimated Creatinine Clearance: 32.5 mL/min (A) (by C-G formula based on SCr of 1.09 mg/dL (H)).    Allergies  Allergen Reactions   Tape Other (See Comments)    The patient's skin is VERY THIN- will TEAR EASILY!!   Voltaren [Diclofenac] Swelling and Other (See Comments)    Angioedema    Antimicrobials this admission: 2/8 cefepime >> 2/8 2/9 CTX + metronidazole >> 2/10  2/10 Unasyn >>    Dose adjustments this admission:   Microbiology results: Micro (prior to admission): 2/3 UCx: >100k Proteus ( R to Cipro, nitrofurantoin )   Micro (inpt): 2/7 BCx: ngtd 2/7 UCx: 20k enterococcus faecium (S linezolid only) 2/7 RVP: negative 2/7 Flu, COVID, RSV: negative   Thank you for allowing pharmacy to be a part of this patient's care.    Royetta Asal, PharmD, BCPS 09/29/2022 8:29 PM

## 2022-09-29 NOTE — Assessment & Plan Note (Signed)
Patient experiencing episodes of respiratory distress and hypoxia with attempts to ingest food Patient requiring regular suctioning  Supplemental oxygen to maintain oxygen saturations of 92% and up Etiology secondary to multi focal aspiration pneumonia with repeated bouts of aspiration Treating underlying pneumonia with antibiotics as well as intermittent bouts of intravenous diuretics, bronchodilator therapy and frequent suctioning

## 2022-09-29 NOTE — Progress Notes (Signed)
PROGRESS NOTE   Renee [name]  [id number] DOB: Sep 02, 1928 DOA: 09/26/2022 PCP: Girtha Rm, NP-C   Date of Service: the patient was seen and examined on 09/29/2022  Brief Narrative:  89 y.o. female with medical history significant for prior stroke with left-sided hemiparesis and dysphagia, chronic diastolic CHF (echo [id number] EF 65-70% with G2DD), nocturnal hypoxemia, recent diagnosis of UTI and on antibiotics, history of CVA with left-sided weakness, laryngopharyngeal reflux disease, pharyngoesophageal dysphagia, bilateral chronic otitis media with effusion status post prior ear tube placement, physical deconditioning, chronic anxiety/depression, mobility impairment, chronic respiratory failure with hypoxia, who presented to [facility] ED from her PCP office due to lethargy.  Of note, patient had a recent fall at home resulting in low back pain.  Furthermore, the patient had recently been diagnosed with a urinary tract infection on 2/3 via urgent care with urine cultures growing out Proteus.  Patient has been treated in the outpatient setting with Keflex and Levaquin.  Upon evaluation in the emergency department patient clinically was felt to be suffering from acute cardiogenic volume overload.  A one-time dose of intravenous Lasix was administered.  Due to patient's continued confusion the hospitalist group was then called to assess the patient for admission the hospital.  Upon admission to the hospital encephalopathy workup was expanded.  Patient was initially placed on intravenous antibiotics with ceftriaxone and Flagyl for suspected aspiration pneumonia as well as recent diagnosis of Proteus urinary tract infection on 2/3.  Repeat urine culture on 2/7 grew out Enterococcus sensitive to linezolid and therefore antibiotic regimen was transitioned to a combination of intravenous Unasyn and linezolid.   Due to suspected aspiration pneumonia speech therapy evaluation was performed and it was felt that  patient did suffer from some degree of aspiration.  A dysphagia diet was ordered.      Assessment and Plan: * Acute metabolic encephalopathy Some clinical improvement since yesterday with improving mentation Baseline mentation unclear Suspected etiology is multiple sources of infection including aspiration pneumonia and urinary tract infection Adjusting intravenous antibiotics to include intravenous Unasyn and linezolid covering for possibilities of aspiration pneumonia (multiple episodes of witnessed aspiration) with growth Proteus and Enterococcus faecium growing from separate urine cultures. On melatonin nightly to regulate sleep cycle and  minimize bouts of extreme agitation at night Judicious usage of as needed low-dose Haldol for bouts of extreme agitation    Complicated UTI (urinary tract infection) Urine culture on 2/3 growing out Proteus mirabilis.  Urine culture on 2/7 growing out Enterococcus faecium Intravenous antibiotics have been adjusted as noted above Will provide patient with at least 5 days of antibiotics  Acute respiratory failure with hypoxia ([place]) Evening of 2/10 patient began to experience another episode of aspiration resulting in severe hypoxia with oxygen saturations in the 80s Patient has been temporarily placed on a nonrebreather mask Repeat chest x-ray reveals no change ABG reveals PaO2 of 100 however this is on a nonrebreather mask which suggests substantial hypoxic respiratory failure Etiology felt to be secondary to pneumonia. Additionally provided patient with a trial dose of intravenous Lasix considering elevated BNP and known history of diastolic dysfunction  Aspiration pneumonia of both lower lobes due to gastric secretions ([place]) Significant witnessed aspiration event by nursing the morning of 2/9 and again the evening of 2/10. Bilateral lower lobe infiltrates on chest x-ray consistent with pneumonia the slightly elevated procalcitonin and markedly  elevated CRP. Blood cultures remain without growth Intravenous antibiotics as above  Acute on chronic diastolic CHF (congestive heart  failure) (HCC) BNP still found to be elevated dose of intravenous Lasix was administered in the emergency department Administering additional dose of intravenous Lasix  Hyperkalemia Resolved  Type 2 diabetes mellitus with stage 3b chronic kidney disease, without long-term current use of insulin (HCC) Brittle diabetic control throughout the hospitalization with both hyperglycemia and hypoglycemia Increasing basal bolus insulin therapy to 4 units of NovoLog before every meal and 10 units of Semglee daily Accu-Cheks before every meal and nightly with sliding scale insulin Hemoglobin A1c 9.7% Patient's numerous oral hypoglycemics have been held  Dysphagia Witnessed episode of aspiration this morning and again an additional episode of aspiration the evening of 2/10. Initially dysphagia 3 diet was recommended by speech.  I have switched the diet order to a dysphagia 1/pured diet due to continued difficulty with swallowing foods.  Chronic kidney disease, stage 3b (HCC) Strict intake and output monitoring Creatinine near baseline Minimizing nephrotoxic agents as much as possible Serial chemistries to monitor renal function and electrolytes   Essential hypertension Relaxed blood pressure targets due to advanced age. Continue amlodipine 10 mg daily As needed intravenous antihypertensives for markedly elevated blood pressures.  GERD without esophagitis Continuing home regimen of daily PPI therapy.    Subjective:  Patient is unable to answer questions appropriately due to encephalopathy.  Physical Exam:  Vitals:   09/28/22 1620 09/28/22 2038 09/29/22 0550 09/29/22 1241  BP: (!) 161/66 (!) 124/95 (!) 150/77 (!) 180/62  Pulse: 71 74 73 68  Resp:  (!) 23  18  Temp:  98.7 F (37.1 C) 98.8 F (37.1 C) 97.6 F (36.4 C)  TempSrc:  Axillary Axillary  Oral  SpO2: 100% 96% 100% 100%  Weight:      Height:        Constitutional: Lethargic but arousable, oriented x 3, patient intermittently follows commands..   Skin: no rashes, no lesions, good skin turgor noted. Eyes: Pupils are equally reactive to light.  No evidence of scleral icterus or conjunctival pallor.  ENMT: Moist mucous membranes noted.  Posterior pharynx clear of any exudate or lesions.   Respiratory: Coarse breath sounds, particularly in the bilateral lower fields.  Intermittent mild expiratory wheezing.  Increased respiratory effort without accessory muscle use.   Cardiovascular: Regular rate and rhythm, no murmurs / rubs / gallops. No extremity edema. 2+ pedal pulses. No carotid bruits.  Abdomen: Abdomen is soft and nontender.  No evidence of intra-abdominal masses.  Positive bowel sounds noted in all quadrants.   Musculoskeletal: No joint deformity upper and lower extremities.  Poor ROM, no contractures. Normal muscle tone.  Neuro: Lethargic but arousable, intermittently follows commands, severe left-sided weakness of the left upper and lower extremities from an old stroke.  Sensation is seemingly grossly intact.  Does respond to verbal and painful stimuli.  Data Reviewed:  I have personally reviewed and interpreted labs, imaging.  Significant findings are   CBC: Recent Labs  Lab 09/26/22 1533 09/26/22 2350 09/27/22 0433 09/28/22 0357 09/29/22 0540  WBC 8.5 8.8 15.5* 16.9* 13.9*  NEUTROABS 6.8  --   --   --  12.0*  HGB 9.9* 9.5* 9.2* 8.6* 8.8*  HCT 33.1* 31.4* 30.6* 29.1* 29.6*  MCV 93.2 92.9 92.7 93.9 92.5  PLT 207 195 236 201 [id number]   Basic Metabolic Panel: Recent Labs  Lab 09/27/22 0433 09/27/22 1032 09/28/22 0357 09/28/22 1224 09/29/22 0540  NA 137 135 131* 132* 137  K 3.0* 4.6 5.4* 4.6 4.9  CL 99 95* 97* 101 104  CO2 26 27 23 22 22  $ GLUCOSE 95 223* 360* 217* 115*  BUN 22 22 24* 22 23  CREATININE 1.27* 1.27* 1.50* 1.42* 1.09*  CALCIUM 8.7* 8.5* 7.7*  7.9* 8.0*  MG 1.9  --   --   --  1.8  PHOS 1.9* 4.5 4.0 2.9  --    GFR: Estimated Creatinine Clearance: 32.5 mL/min (A) (by C-G formula based on SCr of 1.09 mg/dL (H)). Liver Function Tests: Recent Labs  Lab 09/26/22 1533 09/28/22 1224 09/29/22 0540  AST 26  --  22  ALT 25  --  20  ALKPHOS 324*  --  211*  BILITOT 0.8  --  0.3  PROT 6.7  --  5.2*  ALBUMIN 3.6 2.8* 2.6*      EKG/Telemetry: Personally reviewed.  Rhythm is sinus tachycardia with heart rate of 100 bpm.   Code Status:  Full code.  Code status decision has been confirmed with: POA on date of admission.  Family Communication: Biological family is currently at the bedside including the patient's sister and nephew.  Unfortunately, due to a court appointed interim guardianship with DSS initiated on 2/9, avoiding detailed discussions about patient's condition with other parties until we have provided with further direction by our point of contacted DSS.   Severity of Illness:  The appropriate patient status for this patient is INPATIENT. Inpatient status is judged to be reasonable and necessary in order to provide the required intensity of service to ensure the patient's safety. The patient's presenting symptoms, physical exam findings, and initial radiographic and laboratory data in the context of their chronic comorbidities is felt to place them at high risk for further clinical deterioration. Furthermore, it is not anticipated that the patient will be medically stable for discharge from the hospital within 2 midnights of admission.   * I certify that at the point of admission it is my clinical judgment that the patient will require inpatient hospital care spanning beyond 2 midnights from the point of admission due to high intensity of service, high risk for further deterioration and high frequency of surveillance required.*  Time spent:  60 minutes  Author:  Vernelle Emerald MD  09/29/2022 8:12 PM

## 2022-09-29 NOTE — Progress Notes (Signed)
Physical Therapy Treatment Patient Details Name: Renee [name] MRN: [id number] DOB: 1928/10/14 Today's Date: 09/29/2022   History of Present Illness Renee [name] is a 89 y.o. female admitted with acute metabolic encephalopathy. CT head with no acute intracranial abnormality. Noted significant blood sugar fluctuation tPMH: diabetes, CKD, CHF, previous stroke with chronic left-sided weakness, HTN, hyperlipidemia, and currently on antibiotics for urinary tract infection    PT Comments    Max assist of 2 for bed mobility. Pt sat edge of bed x 15 minutes, initially requiring mod A for sitting balance, then min/guard assist. Performed reaching activities with BUEs, and seated BLE exercises. Pt initially lethargic but became more alert with activity. Family (sister and great nephew) in room and agree to plan for ST-SNF.     Recommendations for follow up therapy are one component of a multi-disciplinary discharge planning process, led by the attending physician.  Recommendations may be updated based on patient status, additional functional criteria and insurance authorization.  Follow Up Recommendations  Skilled nursing-short term rehab (<3 hours/day) Can patient physically be transported by private vehicle: No   Assistance Recommended at Discharge Frequent or constant Supervision/Assistance  Patient can return home with the following Two people to help with walking and/or transfers;Two people to help with bathing/dressing/bathroom;Assistance with cooking/housework;Assist for transportation;Help with stairs or ramp for entrance;Direct supervision/assist for medications management;Direct supervision/assist for financial management   Equipment Recommendations  None recommended by PT    Recommendations for Other Services       Precautions / Restrictions Precautions Precautions: Fall Precaution Comments: LUE weak 2* prior CVA Restrictions Weight Bearing Restrictions: No     Mobility  Bed  Mobility Overal bed mobility: Needs Assistance Bed Mobility: Supine to Sit     Supine to sit: Max assist, +2 for physical assistance Sit to supine: Total assist, +2 for physical assistance   General bed mobility comments: Max A to bring BLE to EOB and max A to upright trunk into sitting EOB, pt with poor sitting balance needing mod A to maintain static sitting - patient leaning to the right initially. Eventually able to maintain trunk upright after having pt reach to L with RUE. Pt sat EOB ~15 minutes performed LE exercises and reaching activities with BUEs    Transfers                        Ambulation/Gait               General Gait Details: NT- pt too fatigued after sitting EOB   Stairs             Wheelchair Mobility    Modified Rankin (Stroke Patients Only)       Balance Overall balance assessment: Needs assistance Sitting-balance support: Single extremity supported, Feet supported Sitting balance-Leahy Scale: Poor Sitting balance - Comments: mod A to prevent LOB initially, then min guard. Performed reaching activities [place]. Sat EOB 15 minutes. Postural control: Right lateral lean, Posterior lean                                  Cognition Arousal/Alertness: Lethargic Behavior During Therapy: Flat affect Overall Cognitive Status: Difficult to assess                                 General Comments: lethargic but awakens with  verbal/tactile stimulii; sister, great nephew in room and pt able to recognize them, able to follow commands with increased time        Exercises General Exercises - Lower Extremity Ankle Circles/Pumps: AROM, Both, 10 reps, Seated Long Arc Quad: AROM, Both, 10 reps, Seated Heel Slides: AAROM, Both, 10 reps, Supine Hip ABduction/ADduction: AAROM, Both, 10 reps, Supine    General Comments        Pertinent Vitals/Pain Pain Assessment Pain Assessment: No/denies pain Pain Score: 0-No  pain Faces Pain Scale: No hurt Breathing: normal Negative Vocalization: none Facial Expression: smiling or inexpressive Body Language: relaxed Consolability: no need to console PAINAD Score: 0    Home Living                          Prior Function            PT Goals (current goals can now be found in the care plan section) Acute Rehab PT Goals Patient Stated Goal: rehab PT Goal Formulation: With family Time For Goal Achievement: 10/11/22 Potential to Achieve Goals: Fair Progress towards PT goals: Progressing toward goals    Frequency    Min 2X/week      PT Plan Discharge plan needs to be updated    Co-evaluation              AM-PAC PT "6 Clicks" Mobility   Outcome Measure  Help needed turning from your back to your side while in a flat bed without using bedrails?: A Lot Help needed moving from lying on your back to sitting on the side of a flat bed without using bedrails?: Total Help needed moving to and from a bed to a chair (including a wheelchair)?: Total Help needed standing up from a chair using your arms (e.g., wheelchair or bedside chair)?: Total Help needed to walk in hospital room?: Total Help needed climbing 3-5 steps with a railing? : Total 6 Click Score: 7    End of Session Equipment Utilized During Treatment: Oxygen Activity Tolerance: Patient tolerated treatment well Patient left: in bed;with call bell/phone within reach;with bed alarm set;with family/visitor present Nurse Communication: Mobility status;Need for lift equipment PT Visit Diagnosis: Other abnormalities of gait and mobility (R26.89);Muscle weakness (generalized) (M62.81)     Time: [id number] PT Time Calculation (min) (ACUTE ONLY): 29 min  Charges:  $Therapeutic Exercise: 8-22 mins $Therapeutic Activity: 8-22 mins                     Blondell Reveal Kistler PT 09/29/2022  Acute Rehabilitation Services  Office [phone]

## 2022-09-30 ENCOUNTER — Inpatient Hospital Stay (HOSPITAL_COMMUNITY): Payer: PPO

## 2022-09-30 ENCOUNTER — Encounter (HOSPITAL_COMMUNITY): Payer: Self-pay | Admitting: Internal Medicine

## 2022-09-30 DIAGNOSIS — J690 Pneumonitis due to inhalation of food and vomit: Secondary | ICD-10-CM | POA: Diagnosis not present

## 2022-09-30 DIAGNOSIS — G9341 Metabolic encephalopathy: Secondary | ICD-10-CM | POA: Diagnosis not present

## 2022-09-30 DIAGNOSIS — J9601 Acute respiratory failure with hypoxia: Secondary | ICD-10-CM | POA: Diagnosis not present

## 2022-09-30 DIAGNOSIS — S2241XA Multiple fractures of ribs, right side, initial encounter for closed fracture: Secondary | ICD-10-CM

## 2022-09-30 DIAGNOSIS — N390 Urinary tract infection, site not specified: Secondary | ICD-10-CM | POA: Diagnosis not present

## 2022-09-30 LAB — GLUCOSE, CAPILLARY
Glucose-Capillary: 261 mg/dL — ABNORMAL HIGH (ref 70–99)
Glucose-Capillary: 165 mg/dL — ABNORMAL HIGH (ref 70–99)
Glucose-Capillary: 159 mg/dL — ABNORMAL HIGH (ref 70–99)
Glucose-Capillary: 117 mg/dL — ABNORMAL HIGH (ref 70–99)

## 2022-09-30 LAB — CBC WITH DIFFERENTIAL/PLATELET
Abs Immature Granulocytes: 0.12 10*3/uL — ABNORMAL HIGH (ref 0.00–0.07)
Basophils Absolute: 0.1 10*3/uL (ref 0.0–0.1)
Basophils Relative: 0 %
Eosinophils Absolute: 0.1 10*3/uL (ref 0.0–0.5)
Eosinophils Relative: 0 %
HCT: 31.4 % — ABNORMAL LOW (ref 36.0–46.0)
Hemoglobin: 9.1 g/dL — ABNORMAL LOW (ref 12.0–15.0)
Immature Granulocytes: 1 %
Lymphocytes Relative: 5 %
Lymphs Abs: 0.7 10*3/uL (ref 0.7–4.0)
MCH: 27.7 pg (ref 26.0–34.0)
MCHC: 29 g/dL — ABNORMAL LOW (ref 30.0–36.0)
MCV: 95.4 fL (ref 80.0–100.0)
Monocytes Absolute: 0.9 10*3/uL (ref 0.1–1.0)
Monocytes Relative: 6 %
Neutro Abs: 12.7 10*3/uL — ABNORMAL HIGH (ref 1.7–7.7)
Neutrophils Relative %: 88 %
Platelets: 212 10*3/uL (ref 150–400)
RBC: 3.29 MIL/uL — ABNORMAL LOW (ref 3.87–5.11)
RDW: 14.2 % (ref 11.5–15.5)
WBC: 14.6 10*3/uL — ABNORMAL HIGH (ref 4.0–10.5)
nRBC: 0 % (ref 0.0–0.2)

## 2022-09-30 LAB — RAPID URINE DRUG SCREEN, HOSP PERFORMED
Amphetamines: NOT DETECTED
Barbiturates: NOT DETECTED
Benzodiazepines: NOT DETECTED
Cocaine: NOT DETECTED
Opiates: NOT DETECTED
Tetrahydrocannabinol: NOT DETECTED

## 2022-09-30 LAB — COMPREHENSIVE METABOLIC PANEL
ALT: 20 U/L (ref 0–44)
AST: 24 U/L (ref 15–41)
Albumin: 2.7 g/dL — ABNORMAL LOW (ref 3.5–5.0)
Alkaline Phosphatase: 221 U/L — ABNORMAL HIGH (ref 38–126)
Anion gap: 10 (ref 5–15)
BUN: 22 mg/dL (ref 8–23)
CO2: 22 mmol/L (ref 22–32)
Calcium: 7.9 mg/dL — ABNORMAL LOW (ref 8.9–10.3)
Chloride: 98 mmol/L (ref 98–111)
Creatinine, Ser: 1.32 mg/dL — ABNORMAL HIGH (ref 0.44–1.00)
GFR, Estimated: 38 mL/min — ABNORMAL LOW (ref >60)
Glucose, Bld: 290 mg/dL — ABNORMAL HIGH (ref 70–99)
Potassium: 4.9 mmol/L (ref 3.5–5.1)
Sodium: 130 mmol/L — ABNORMAL LOW (ref 135–145)
Total Bilirubin: 0.7 mg/dL (ref 0.3–1.2)
Total Protein: 5.4 g/dL — ABNORMAL LOW (ref 6.5–8.1)

## 2022-09-30 LAB — MAGNESIUM: Magnesium: 1.8 mg/dL (ref 1.7–2.4)

## 2022-09-30 MED ORDER — IOHEXOL 9 MG/ML PO SOLN
ORAL | Status: AC
  Filled 2022-09-30: qty 1000

## 2022-09-30 MED ORDER — INSULIN ASPART 100 UNIT/ML IJ SOLN
0.0000 [IU] | Freq: Four times a day (QID) | INTRAMUSCULAR | Status: DC
  Administered 2022-10-01 – 2022-10-02 (×2): 2 [IU] via SUBCUTANEOUS

## 2022-09-30 MED ORDER — ENOXAPARIN SODIUM 30 MG/0.3ML IJ SOSY
30.0000 mg | PREFILLED_SYRINGE | INTRAMUSCULAR | Status: DC
  Administered 2022-09-30 – 2022-10-03 (×4): 30 mg via SUBCUTANEOUS
  Filled 2022-09-30 (×4): qty 0.3

## 2022-09-30 MED ORDER — INSULIN ASPART 100 UNIT/ML IJ SOLN
5.0000 [IU] | Freq: Three times a day (TID) | INTRAMUSCULAR | Status: DC
  Administered 2022-10-01 – 2022-10-02 (×3): 5 [IU] via SUBCUTANEOUS

## 2022-09-30 MED ORDER — FUROSEMIDE 10 MG/ML IJ SOLN
40.0000 mg | Freq: Once | INTRAMUSCULAR | Status: AC
  Administered 2022-10-01: 40 mg via INTRAVENOUS
  Filled 2022-09-30: qty 4

## 2022-09-30 MED ORDER — SODIUM CHLORIDE 0.9 % IV SOLN
3.0000 g | Freq: Two times a day (BID) | INTRAVENOUS | Status: DC
  Administered 2022-09-30 – 2022-10-03 (×7): 3 g via INTRAVENOUS
  Filled 2022-09-30 (×8): qty 8

## 2022-09-30 MED ORDER — IPRATROPIUM-ALBUTEROL 0.5-2.5 (3) MG/3ML IN SOLN: 3.0000 mL | RESPIRATORY_TRACT | Status: DC | PRN

## 2022-09-30 MED ORDER — INSULIN GLARGINE-YFGN 100 UNIT/ML ~~LOC~~ SOLN
12.0000 [IU] | Freq: Every day | SUBCUTANEOUS | Status: DC
  Administered 2022-10-01 – 2022-10-02 (×2): 12 [IU] via SUBCUTANEOUS
  Filled 2022-09-30 (×2): qty 0.12

## 2022-09-30 MED ORDER — IOHEXOL 9 MG/ML PO SOLN
500.0000 mL | ORAL | Status: AC
  Administered 2022-09-30: 500 mL via ORAL

## 2022-09-30 MED ORDER — HALOPERIDOL LACTATE 5 MG/ML IJ SOLN
1.0000 mg | Freq: Once | INTRAMUSCULAR | Status: AC
  Administered 2022-09-30: 1 mg via INTRAVENOUS
  Filled 2022-09-30: qty 1

## 2022-09-30 MED ORDER — IOHEXOL 300 MG/ML  SOLN: 100.0000 mL | Freq: Once | INTRAMUSCULAR | Status: DC | PRN

## 2022-09-30 NOTE — Progress Notes (Signed)
PROGRESS NOTE   Renee [name]  [id number] DOB: February 06, 1929 DOA: 09/26/2022 PCP: Girtha Rm, NP-C   Date of Service: the patient was seen and examined on 09/30/2022  Brief Narrative:  89 y.o. female with medical history significant for prior stroke with left-sided hemiparesis and dysphagia, chronic diastolic CHF (echo [id number] EF 65-70% with G2DD), nocturnal hypoxemia, recent diagnosis of UTI and on antibiotics, history of CVA with left-sided weakness, laryngopharyngeal reflux disease, pharyngoesophageal dysphagia, bilateral chronic otitis media with effusion status post prior ear tube placement, physical deconditioning, chronic anxiety/depression, mobility impairment, chronic respiratory failure with hypoxia, who presented to [facility] ED from her PCP office due to lethargy.  Of note, patient had a recent fall at home resulting in low back pain.  Furthermore, the patient had recently been diagnosed with a urinary tract infection on 2/3 via urgent care with urine cultures growing out Proteus.  Patient has been treated in the outpatient setting with Keflex and Levaquin.  Upon evaluation in the emergency department patient clinically was felt to be suffering from acute cardiogenic volume overload.  A one-time dose of intravenous Lasix was administered.  Due to patient's continued confusion the hospitalist group was then called to assess the patient for admission the hospital.  Upon admission to the hospital encephalopathy workup was expanded.  Patient was initially placed on intravenous antibiotics with ceftriaxone and Flagyl for suspected aspiration pneumonia as well as recent diagnosis of Proteus urinary tract infection on 2/3.  Repeat urine culture on 2/7 grew out Enterococcus sensitive to linezolid and therefore antibiotic regimen was transitioned to a combination of intravenous Unasyn and linezolid.   Hospital course has been complicated by multiple episodes of aspiration and progressive hypoxic  respiratory failure.     Assessment and Plan: * Acute metabolic encephalopathy Persisting lethargy and intermittent confusion with what seems to be profound dysphagia Baseline mentation unclear as patient's caretaker is currently not allowed on the premises per the court appointed guardian ad litem (documented discussion 2/9) MRI brain obtained today reveals no evidence of acute stroke or mass occupying lesion TSH, VBG, ammonia, vitamin B 12 level, UDS all unrevealing Suspected etiology is multiple sources of infection including aspiration pneumonia and urinary tract infection (organisms enterococcus faecium on 2/7 and proteus on 2/3) Continuing treatment with Unasyn and linezolid  Judicious usage of as needed low-dose Haldol for bouts of extreme agitation    Complicated UTI (urinary tract infection) Urine culture on 2/3 growing out Proteus mirabilis.  Urine culture on 2/7 growing out Enterococcus faecium Intravenous antibiotics have been adjusted as noted above Will provide patient with at least 5 days of antibiotics  Acute respiratory failure with hypoxia [facility]) Patient experiencing episodes of respiratory distress and hypoxia with attempts to ingest food Patient requiring regular suctioning  Supplemental oxygen to maintain oxygen saturations of 92% and up Etiology secondary to multi focal aspiration pneumonia with repeated bouts of aspiration Treating underlying pneumonia with antibiotics as well as intermittent bouts of intravenous diuretics, bronchodilator therapy and frequent suctioning  Aspiration pneumonia of both lower lobes due to gastric secretions ([place]) Multiple witnessed episodes of aspiration despite attempts to provide patient with a dysphagia 1 diet. Multifocal pneumonia seen on chest imaging  Blood cultures remain without growth Intravenous antibiotics as above Will keep patient n.p.o. for now, ask speech therapy to reevaluate  Acute on chronic diastolic CHF  (congestive heart failure) (HCC) Intermittent doses of intravenous Lasix being administered Last echocardiogram reveals preserved ejection fraction with grade 2 diastolic dysfunction  Closed fracture of two ribs, right, initial encounter Unclear etiology as patient is unable to provide a reliable history No other physical evidence of traumatic injury Supportive care  Type 2 diabetes mellitus with stage 3b chronic kidney disease, without long-term current use of insulin (HCC) Brittle diabetic control throughout the hospitalization with both hyperglycemia and hypoglycemia Increasing basal bolus insulin therapy to 5 units of NovoLog before every meal and 12 units of Semglee daily Accu-Cheks before every meal and nightly with sliding scale insulin Hemoglobin A1c 9.7% Patient's numerous oral hypoglycemics have been held  Dysphagia Multiple witnessed episodes of aspiration with worsening respiratory failure despite dysphagia 1 diet Patient now n.p.o. Please see remainder of assessment and plan as above.  Chronic kidney disease, stage 3b (HCC) Strict intake and output monitoring Creatinine near baseline Minimizing nephrotoxic agents as much as possible Serial chemistries to monitor renal function and electrolytes   Essential hypertension Relaxed blood pressure targets due to advanced age. Continue amlodipine 10 mg daily As needed intravenous antihypertensives for markedly elevated blood pressures.  GERD without esophagitis Continuing home regimen of daily PPI therapy.   Hyperkalemia Resolved        Subjective:  Patient unable to answer questions appropriately due to lethargy, confusion  Physical Exam:  Vitals:   09/30/22 0643 09/30/22 0818 09/30/22 1247 09/30/22 2148  BP: (!) 158/83  (!) 163/72 (!) 155/99  Pulse: 86  80 73  Resp:   20 20  Temp: 98.6 F (37 C)  98.6 F (37 C) (!) 97.5 F (36.4 C)  TempSrc: Axillary  Oral Oral  SpO2: 99% 95% 100% 100%  Weight:       Height:        Constitutional: Arousable, oriented, in mild respiratory distress Skin: no rashes, no lesions, poor skin turgor noted. Eyes: Pupils are equally reactive to light.  No evidence of scleral icterus or conjunctival pallor.  ENMT: Moist mucous membranes noted.  Posterior pharynx clear of any exudate or lesions.   Respiratory: Somewhat coarse breath sounds in the bases bilaterally with rales and no evidence of wheezing.  Patient is somewhat tachypneic without accessory muscle use.   Cardiovascular: Regular rate and rhythm, no murmurs / rubs / gallops.  Dependent edema of the left extremities.  2+ pedal pulses. No carotid bruits.  Abdomen: Abdomen is soft and nontender.  No evidence of intra-abdominal masses.  Positive bowel sounds noted in all quadrants.   Musculoskeletal: Poor muscle tone noted.   Data Reviewed:  I have personally reviewed and interpreted labs, imaging.  Significant findings are   CBC: Recent Labs  Lab 09/26/22 1533 09/26/22 2350 09/27/22 0433 09/28/22 0357 09/29/22 0540 09/30/22 0500  WBC 8.5 8.8 15.5* 16.9* 13.9* 14.6*  NEUTROABS 6.8  --   --   --  12.0* 12.7*  HGB 9.9* 9.5* 9.2* 8.6* 8.8* 9.1*  HCT 33.1* 31.4* 30.6* 29.1* 29.6* 31.4*  MCV 93.2 92.9 92.7 93.9 92.5 95.4  PLT 207 195 236 201 208 [id number]   Basic Metabolic Panel: Recent Labs  Lab 09/27/22 0433 09/27/22 1032 09/28/22 0357 09/28/22 1224 09/29/22 0540 09/30/22 0500  NA 137 135 131* 132* 137 130*  K 3.0* 4.6 5.4* 4.6 4.9 4.9  CL 99 95* 97* 101 104 98  CO2 26 27 23 22 22 22  $ GLUCOSE 95 223* 360* 217* 115* 290*  BUN 22 22 24* 22 23 22  $ CREATININE 1.27* 1.27* 1.50* 1.42* 1.09* 1.32*  CALCIUM 8.7* 8.5* 7.7* 7.9* 8.0* 7.9*  MG 1.9  --   --   --  1.8 1.8  PHOS 1.9* 4.5 4.0 2.9  --   --    GFR: Estimated Creatinine Clearance: 26.8 mL/min (A) (by C-G formula based on SCr of 1.32 mg/dL (H)). Liver Function Tests: Recent Labs  Lab 09/26/22 1533 09/28/22 1224 09/29/22 0540  09/30/22 0500  AST 26  --  22 24  ALT 25  --  20 20  ALKPHOS 324*  --  211* 221*  BILITOT 0.8  --  0.3 0.7  PROT 6.7  --  5.2* 5.4*  ALBUMIN 3.6 2.8* 2.6* 2.7*    Coagulation Profile: No results for input(s): "INR", "PROTIME" in the last 168 hours.   EKG/Telemetry: Personally reviewed.  Rhythm is normal sinus rhythm with heart rate of 79 bpm.  Evidence of PVCs.  No dynamic ST segment changes appreciated.   Code Status:  Full code.      Severity of Illness:  The appropriate patient status for this patient is INPATIENT. Inpatient status is judged to be reasonable and necessary in order to provide the required intensity of service to ensure the patient's safety. The patient's presenting symptoms, physical exam findings, and initial radiographic and laboratory data in the context of their chronic comorbidities is felt to place them at high risk for further clinical deterioration. Furthermore, it is not anticipated that the patient will be medically stable for discharge from the hospital within 2 midnights of admission.   * I certify that at the point of admission it is my clinical judgment that the patient will require inpatient hospital care spanning beyond 2 midnights from the point of admission due to high intensity of service, high risk for further deterioration and high frequency of surveillance required.*  Time spent:  60 minutes  Author:  Vernelle Emerald MD  09/30/2022 10:59 PM

## 2022-09-30 NOTE — Progress Notes (Signed)
RT assessed pt to find that pt has oral secretions in the back of her throat. Discussed pt's Respiratory status with MD. NTS ordered placed but pt responds well to oral suction.

## 2022-09-30 NOTE — Assessment & Plan Note (Addendum)
Unclear etiology, patient does not recall any specific trauma that would have caused this Physical exam unremarkable. No other clinical evidence of physical injury or abuse on exam Supportive care Will obtain vitamin D level

## 2022-09-30 NOTE — Progress Notes (Addendum)
Pharmacy Antibiotic Note  Renee [name] is a 89 y.o. female admitted on 09/26/2022 with  AMS . Pt was taking cephalexin at the time of admission for proteus mirabilis UTI (prescribed on 2/3). Broad spectrum antibiotics (cefepime) started on admission. Pt has completed sufficient course of antibiotics for Proteus UTI. Inpt urine culture results (20k colony count VRE) discussed with MD on 2/9 - initially not treating, but now treating with linezolid.   Pt now on antibiotics for aspiration PNA. Pharmacy has been consulted for ampicillin/sulbactam dosing.  Today, 09/30/22 SCr bumped slightly, CrCl now 27 mL/min WBC remains slightly elevated. Afebrile  Plan: Reduce dose of ampicillin/sulbactam to 3 g IV q12h based on renal function Monitor renal function for further necessary dose adjustments Linezolid per MD  Height: 5' 3"$  (160 cm) Weight: 74.5 kg (164 lb 3.9 oz) IBW/kg (Calculated) : 52.4  Temp (24hrs), Avg:98.3 F (36.8 C), Min:97.6 F (36.4 C), Max:98.8 F (37.1 C)  Recent Labs  Lab 09/26/22 1533 09/26/22 2005 09/26/22 2350 09/27/22 0433 09/27/22 1032 09/28/22 0357 09/28/22 1224 09/29/22 0540 09/30/22 0500  WBC 8.5  --  8.8 15.5*  --  16.9*  --  13.9* 14.6*  CREATININE 1.47*  --  1.53* 1.27* 1.27* 1.50* 1.42* 1.09* 1.32*  LATICACIDVEN 1.0 1.1  --   --   --   --   --   --   --     Estimated Creatinine Clearance: 26.8 mL/min (A) (by C-G formula based on SCr of 1.32 mg/dL (H)).    Allergies  Allergen Reactions   Tape Other (See Comments)    The patient's skin is VERY THIN- will TEAR EASILY!!   Voltaren [Diclofenac] Swelling and Other (See Comments)    Angioedema    Antimicrobials this admission: cefepime 2/8 >> 2/9 CTX + metronidazole 2/9 >> 2/10 Ampicillin/sulbactam 2/10 >> Linezolid 2/10 >>  Dose adjustments this admission: 2/11: Ampicillin/sulbactam 3 g IV q6h >> 3g IV q12h  Microbiology results: Prior to admission: 2/3 UCx: >100k Proteus (R to Cipro,  nitrofurantoin )   Inpatient: 2/7 BCx: ngtd 2/7 UCx: 20k enterococcus faecium (S linezolid only) 2/7 RVP: negative 2/7 Flu, COVID, RSV: negative   Lenis Noon, PharmD 09/30/2022 8:49 AM

## 2022-09-30 NOTE — Plan of Care (Signed)
  Problem: Coping: Goal: Ability to adjust to condition or change in health will improve Outcome: Progressing   Problem: Fluid Volume: Goal: Ability to maintain a balanced intake and output will improve Outcome: Progressing   Problem: Metabolic: Goal: Ability to maintain appropriate glucose levels will improve Outcome: Progressing   Problem: Skin Integrity: Goal: Risk for impaired skin integrity will decrease Outcome: Progressing   

## 2022-10-01 ENCOUNTER — Encounter (HOSPITAL_COMMUNITY): Payer: Self-pay | Admitting: Internal Medicine

## 2022-10-01 ENCOUNTER — Telehealth: Payer: Self-pay

## 2022-10-01 ENCOUNTER — Inpatient Hospital Stay (HOSPITAL_COMMUNITY)
Admit: 2022-10-01 | Discharge: 2022-10-01 | Disposition: A | Discharge status: Home / Self Care | Payer: PPO | Attending: Internal Medicine | Admitting: Internal Medicine

## 2022-10-01 ENCOUNTER — Inpatient Hospital Stay (HOSPITAL_COMMUNITY): Payer: PPO

## 2022-10-01 DIAGNOSIS — J9601 Acute respiratory failure with hypoxia: Secondary | ICD-10-CM | POA: Diagnosis not present

## 2022-10-01 DIAGNOSIS — R4182 Altered mental status, unspecified: Secondary | ICD-10-CM | POA: Diagnosis not present

## 2022-10-01 DIAGNOSIS — N390 Urinary tract infection, site not specified: Secondary | ICD-10-CM | POA: Diagnosis not present

## 2022-10-01 DIAGNOSIS — G9341 Metabolic encephalopathy: Secondary | ICD-10-CM | POA: Diagnosis not present

## 2022-10-01 DIAGNOSIS — J690 Pneumonitis due to inhalation of food and vomit: Secondary | ICD-10-CM | POA: Diagnosis not present

## 2022-10-01 LAB — GLUCOSE, CAPILLARY
Glucose-Capillary: 139 mg/dL — ABNORMAL HIGH (ref 70–99)
Glucose-Capillary: 147 mg/dL — ABNORMAL HIGH (ref 70–99)
Glucose-Capillary: 164 mg/dL — ABNORMAL HIGH (ref 70–99)
Glucose-Capillary: 139 mg/dL — ABNORMAL HIGH (ref 70–99)
Glucose-Capillary: 68 mg/dL — ABNORMAL LOW (ref 70–99)
Glucose-Capillary: 121 mg/dL — ABNORMAL HIGH (ref 70–99)

## 2022-10-01 LAB — COMPREHENSIVE METABOLIC PANEL
ALT: 19 U/L (ref 0–44)
AST: 33 U/L (ref 15–41)
Albumin: 2.7 g/dL — ABNORMAL LOW (ref 3.5–5.0)
Alkaline Phosphatase: 170 U/L — ABNORMAL HIGH (ref 38–126)
Anion gap: 8 (ref 5–15)
BUN: 17 mg/dL (ref 8–23)
CO2: 25 mmol/L (ref 22–32)
Calcium: 8.1 mg/dL — ABNORMAL LOW (ref 8.9–10.3)
Chloride: 100 mmol/L (ref 98–111)
Creatinine, Ser: 1.21 mg/dL — ABNORMAL HIGH (ref 0.44–1.00)
GFR, Estimated: 42 mL/min — ABNORMAL LOW (ref >60)
Glucose, Bld: 149 mg/dL — ABNORMAL HIGH (ref 70–99)
Potassium: 4.5 mmol/L (ref 3.5–5.1)
Sodium: 133 mmol/L — ABNORMAL LOW (ref 135–145)
Total Bilirubin: 0.6 mg/dL (ref 0.3–1.2)
Total Protein: 5.1 g/dL — ABNORMAL LOW (ref 6.5–8.1)

## 2022-10-01 LAB — CBC WITH DIFFERENTIAL/PLATELET
Abs Immature Granulocytes: 0.09 10*3/uL — ABNORMAL HIGH (ref 0.00–0.07)
Basophils Absolute: 0.1 10*3/uL (ref 0.0–0.1)
Basophils Relative: 1 %
Eosinophils Absolute: 0.3 10*3/uL (ref 0.0–0.5)
Eosinophils Relative: 3 %
HCT: 28.9 % — ABNORMAL LOW (ref 36.0–46.0)
Hemoglobin: 8.7 g/dL — ABNORMAL LOW (ref 12.0–15.0)
Immature Granulocytes: 1 %
Lymphocytes Relative: 10 %
Lymphs Abs: 0.9 10*3/uL (ref 0.7–4.0)
MCH: 27.9 pg (ref 26.0–34.0)
MCHC: 30.1 g/dL (ref 30.0–36.0)
MCV: 92.6 fL (ref 80.0–100.0)
Monocytes Absolute: 0.8 10*3/uL (ref 0.1–1.0)
Monocytes Relative: 10 %
Neutro Abs: 6.6 10*3/uL (ref 1.7–7.7)
Neutrophils Relative %: 75 %
Platelets: 197 10*3/uL (ref 150–400)
RBC: 3.12 MIL/uL — ABNORMAL LOW (ref 3.87–5.11)
RDW: 13.9 % (ref 11.5–15.5)
WBC: 8.7 10*3/uL (ref 4.0–10.5)
nRBC: 0 % (ref 0.0–0.2)

## 2022-10-01 LAB — CULTURE, BLOOD (ROUTINE X 2)
Culture: NO GROWTH
Culture: NO GROWTH
Special Requests: ADEQUATE

## 2022-10-01 LAB — MAGNESIUM: Magnesium: 1.7 mg/dL (ref 1.7–2.4)

## 2022-10-01 MED ORDER — LIP MEDEX EX OINT
TOPICAL_OINTMENT | CUTANEOUS | Status: DC | PRN
  Administered 2022-10-03: 1 via TOPICAL
  Filled 2022-10-01 (×2): qty 7

## 2022-10-01 NOTE — Progress Notes (Signed)
Modified Barium Swallow Study  Patient Details  Name: Renee [name] MRN: [id number] Date of Birth: [birth year]  Today's Date: 10/01/2022  Modified Barium Swallow completed.  Full report located under Chart Review in the Imaging Section.  History of Present Illness RELMA WIACEK is a 89 y.o. female with medical history significant for chronic diastolic CHF, nocturnal hypoxemia, recent diagnosis of UTI and on antibiotics, history of CVA with left-sided weakness, laryngopharyngeal reflux disease, pharyngoesophageal dysphagia, bilateral chronic otitis media with effusion status post prior ear tube placement, physical deconditioning, chronic anxiety/depression, mobility impairment, chronic respiratory failure with hypoxia, who presented to [facility] ED from her PCP due to lethargy and hypersomnolence. 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 Pna vs atx, ct brain negative for acute, Stable encephalomalacia within the right basal ganglia consistent with prior infarct. pt coughing with po   Clinical Impression Patient exhibited an oropharyngeal dysphagia with sensed aspiration of thin liquids when consumed via large cup sip or straw sip. Delays in initiation of swallow were observed at level of the pyriform sinus with nectar thick and thin liquids and delays at level of vallecular sinus with puree solids, mechanical soft solids and honey thick liquids. She exhibited sensed aspiration with large cup sip as well as with straw sip of thin liquids but was ineffective to clear. She exhibited one instance of trace silent aspiration with thin liquids via consecutive cup sips. Oral phase of swallow was moderately impaired with mechanical soft solids, with prolonged mastication and disorganized tongue motion leading to delays in transit of bolus from anterior to posterior portion of oral cavity but with puree solids, bolus transit was only  mildly prolonged. Penetration and aspiration events with thin liquids occured secondary to patient with incomplete laryngeal vestibule closure and reduced tongue base retraction. Patient did also exhibit partial obstruction of flow of barium secondary to reduced PES opening but no barium retention observed at this level. It would be beneficial to have patient be fed by staff and/or no more than one or two different family members so as to maintain consistency and safety with feeding patient.   Factors that may increase risk of adverse event in presence of aspiration Phineas Douglas & Padilla 2021): Reduced cognitive function;Frail or deconditioned;Weak cough  Swallow Evaluation Recommendations Recommendations: PO diet PO Diet Recommendation: Mildly thick liquids (Level 2, nectar thick);Dysphagia 1 (Pureed) Liquid Administration via: Cup;Straw Medication Administration: Crushed with puree Supervision: Full assist for feeding;Full supervision/cueing for swallowing strategies Swallowing strategies  : Slow rate;Small bites/sips;Check for pocketing or oral holding Postural changes: Position pt fully upright for meals Oral care recommendations: Oral care BID (2x/day);Oral care before ice chips/water;Staff/trained caregiver to provide oral care     Sonia Baller, MA, CCC-SLP Speech Therapy

## 2022-10-01 NOTE — Progress Notes (Signed)
PROGRESS NOTE   Renee [name]  [id number] DOB: 1928/11/16 DOA: 09/26/2022 PCP: Girtha Rm, NP-C   Date of Service: the patient was seen and examined on 10/01/2022  Brief Narrative:  89 y.o. female with medical history significant for prior stroke with left-sided hemiparesis and dysphagia, chronic diastolic CHF (echo [id number] EF 65-70% with G2DD), nocturnal hypoxemia, recent diagnosis of UTI and on antibiotics, history of CVA with left-sided weakness, laryngopharyngeal reflux disease, pharyngoesophageal dysphagia, bilateral chronic otitis media with effusion status post prior ear tube placement, physical deconditioning, chronic anxiety/depression, mobility impairment, chronic respiratory failure with hypoxia, who presented to [facility] ED from her PCP office due to lethargy.  Of note, patient had a recent fall at home resulting in low back pain.  Furthermore, the patient had recently been diagnosed with a urinary tract infection on 2/3 via urgent care with urine cultures growing out Proteus.  Patient has been treated in the outpatient setting with Keflex and Levaquin.  Upon evaluation in the emergency department patient clinically was felt to be suffering from acute cardiogenic volume overload.  A one-time dose of intravenous Lasix was administered.  Due to patient's continued confusion the hospitalist group was then called to assess the patient for admission the hospital.  Upon admission to the hospital encephalopathy workup was expanded.  Patient was initially placed on intravenous antibiotics with ceftriaxone and Flagyl for suspected aspiration pneumonia as well as recent diagnosis of Proteus urinary tract infection on 2/3.  Repeat urine culture on 2/7 grew out Enterococcus sensitive to linezolid and therefore antibiotic regimen was transitioned to a combination of intravenous Unasyn and linezolid.   Hospital course has been complicated by multiple episodes of aspiration and progressive hypoxic  respiratory failure.     Assessment and Plan: * Acute metabolic encephalopathy Improving mentation Suspected etiology is multiple sources of infection including aspiration pneumonia and urinary tract infection (organisms enterococcus faecium on 2/7 and proteus on 2/3) Continuing treatment with Unasyn and linezolid  Baseline mentation unclear due to the patient's caretaker not currently being allowed on the premises per the court appointed guardian ad litem (documented discussion 2/9) EEG performed today, will await results  MRI brain obtained reveals no evidence of acute stroke or mass occupying lesion TSH, VBG, ammonia, vitamin B 12 level, UDS all unrevealing Judicious usage of as needed low-dose Haldol for bouts of extreme agitation    Complicated UTI (urinary tract infection) Urine culture on 2/3 growing out Proteus mirabilis.  Urine culture on 2/7 growing out Enterococcus faecium Intravenous antibiotics are being provided as noted above Will provide patient with at least 5 days of antibiotics and then will reassess clinical response  Acute respiratory failure with hypoxia (HCC) Decreasing oxygen requirement  Acute hypoxic respiratory failure secondary to repeated bouts of aspiration with concurrent aspiration pneumonia  Continuing to suction patient as needed Currently weaning supplemental oxygen maintain oxygen saturations of 92% and up Treating underlying pneumonia with antibiotics as noted above as well as intermittent bouts of intravenous diuretics, bronchodilator therapy and frequent suctioning  Aspiration pneumonia of both lower lobes due to gastric secretions ([place]) Multiple witnessed episodes of aspiration for several days of hospitalization despite provision of dysphagia diet Case discussed with SLP again today who will take patient for MBS due to severe dysphagia likely secondary to old stroke compounded by presence of infection Multifocal infiltrates seen on chest  imaging Blood cultures remain without growth Intravenous antibiotics as above After MBS will place diet order based on the recommendations.  Dysphagia  Multiple witnessed episodes of aspiration for several days of hospitalization with worsening respiratory failure despite dysphagia diet and precautions Please see remainder of assessment and plan as above.  Acute on chronic diastolic CHF (congestive heart failure) (HCC) Patient's respiratory failure thought to be at least in part due to some degree of acute on chronic diastolic congestive heart failure Intermittent doses of intravenous Lasix being administered Last echocardiogram reveals preserved ejection fraction with grade 2 diastolic dysfunction   Type 2 diabetes mellitus with stage 3b chronic kidney disease, without long-term current use of insulin ([place]) During first several days of hospitalization control was quite brittle however glycemic control seems to have improved greatly in the past 24 to 48 hours. Now, continuing basal bolus insulin therapy to 5 units of NovoLog before every meal and 12 units of Semglee daily Accu-Cheks before every meal and nightly with sliding scale insulin Hemoglobin A1c 9.7% Patient's numerous oral hypoglycemics have been held.  This type of patient would best be served by insulin and not long-acting oral hypoglycemics.  Closed fracture of two ribs, right, initial encounter Unclear etiology, patient does not recall any specific trauma that would have caused this No other clinical evidence of physical injury or abuse on exam Supportive care Will obtain vitamin D level  Chronic kidney disease, stage 3b (HCC) Strict intake and output monitoring Creatinine near baseline Minimizing nephrotoxic agents as much as possible Serial chemistries to monitor renal function and electrolytes   Essential hypertension Relaxed blood pressure targets due to advanced age. Continue amlodipine 10 mg daily As needed  intravenous antihypertensives for markedly elevated blood pressures.  GERD without esophagitis Continuing home regimen of daily PPI therapy.   Hyperkalemia Resolved    Subjective:  Patient states that she is feeling much better.  Patient denies shortness of breath or pain.  Physical Exam:  Vitals:   09/30/22 2148 10/01/22 0500 10/01/22 0612 10/01/22 1320  BP: (!) 155/99  (!) 184/71 (!) 147/110  Pulse: 73  80 69  Resp: 20  20 16  $ Temp: (!) 97.5 F (36.4 C)  98.6 F (37 C) 98 F (36.7 C)  TempSrc: Oral  Oral Oral  SpO2: 100%  100% 100%  Weight:  79.1 kg    Height:        Constitutional: Arousable, oriented, not in any acute distress. Skin: no rashes, no lesions, poor skin turgor noted. Eyes: Pupils are equally reactive to light.  No evidence of scleral icterus or conjunctival pallor.  ENMT: Moist mucous membranes noted.  Posterior pharynx clear of any exudate or lesions.   Respiratory: Bibasilar rales noted without any evidence of wheezing.  Improved air entry compared to days prior.  No evidence of increased respiratory effort or accessory muscle use.   Cardiovascular: Regular rate and rhythm, no murmurs / rubs / gallops.  Dependent edema of the left extremities.  2+ pedal pulses. No carotid bruits.  Abdomen: Abdomen is soft and nontender.  No evidence of intra-abdominal masses.  Positive bowel sounds noted in all quadrants.   Musculoskeletal: Poor muscle tone noted. Neuro: Patient experiences baseline severe left-sided weakness.  Strength of the left upper and lower extremities is 2 out of 5 in the distal and proximal muscle groups.  Sensation grossly intact.  Patient now following commands.  Patient now oriented x 3.   Data Reviewed:  I have personally reviewed and interpreted labs, imaging.  Significant findings are   CBC: Recent Labs  Lab 09/26/22 1533 09/26/22 2350 09/27/22 0433 09/28/22 [id number]  09/29/22 0540 09/30/22 0500 10/01/22 0424  WBC 8.5   < > 15.5*  16.9* 13.9* 14.6* 8.7  NEUTROABS 6.8  --   --   --  12.0* 12.7* 6.6  HGB 9.9*   < > 9.2* 8.6* 8.8* 9.1* 8.7*  HCT 33.1*   < > 30.6* 29.1* 29.6* 31.4* 28.9*  MCV 93.2   < > 92.7 93.9 92.5 95.4 92.6  PLT 207   < > 236 201 208 212 197   < > = values in this interval not displayed.   Basic Metabolic Panel: Recent Labs  Lab 09/27/22 0433 09/27/22 1032 09/28/22 0357 09/28/22 1224 09/29/22 0540 09/30/22 0500 10/01/22 0424  NA 137 135 131* 132* 137 130* 133*  K 3.0* 4.6 5.4* 4.6 4.9 4.9 4.5  CL 99 95* 97* 101 104 98 100  CO2 26 27 23 22 22 22 25  $ GLUCOSE 95 223* 360* 217* 115* 290* 149*  BUN 22 22 24* 22 23 22 17  $ CREATININE 1.27* 1.27* 1.50* 1.42* 1.09* 1.32* 1.21*  CALCIUM 8.7* 8.5* 7.7* 7.9* 8.0* 7.9* 8.1*  MG 1.9  --   --   --  1.8 1.8 1.7  PHOS 1.9* 4.5 4.0 2.9  --   --   --    GFR: Estimated Creatinine Clearance: 30.2 mL/min (A) (by C-G formula based on SCr of 1.21 mg/dL (H)). Liver Function Tests: Recent Labs  Lab 09/26/22 1533 09/28/22 1224 09/29/22 0540 09/30/22 0500 10/01/22 0424  AST 26  --  22 24 33  ALT 25  --  20 20 19  $ ALKPHOS 324*  --  211* 221* 170*  BILITOT 0.8  --  0.3 0.7 0.6  PROT 6.7  --  5.2* 5.4* 5.1*  ALBUMIN 3.6 2.8* 2.6* 2.7* 2.7*     Code Status:  Full code.      Severity of Illness:  The appropriate patient status for this patient is INPATIENT. Inpatient status is judged to be reasonable and necessary in order to provide the required intensity of service to ensure the patient's safety. The patient's presenting symptoms, physical exam findings, and initial radiographic and laboratory data in the context of their chronic comorbidities is felt to place them at high risk for further clinical deterioration. Furthermore, it is not anticipated that the patient will be medically stable for discharge from the hospital within 2 midnights of admission.   * I certify that at the point of admission it is my clinical judgment that the patient will require  inpatient hospital care spanning beyond 2 midnights from the point of admission due to high intensity of service, high risk for further deterioration and high frequency of surveillance required.*  Time spent:  55 minutes  Author:  Vernelle Emerald MD  10/01/2022 8:29 PM

## 2022-10-01 NOTE — Plan of Care (Signed)
  Problem: Education: Goal: Knowledge of General Education information will improve Description Including pain rating scale, medication(s)/side effects and non-pharmacologic comfort measures Outcome: Progressing   

## 2022-10-01 NOTE — Telephone Encounter (Signed)
Pharmacy Patient Advocate Encounter  Prior Authorization for [place] (0000000- LIFT HYDRAULIC) has been approved.    PA# [id number] Effective dates: 09/17/2022 through 10/15/2023  Selinda Orion CPhT Rx Patient Advocate  LETTER OF APPROVAL IN MEDIA

## 2022-10-01 NOTE — Procedures (Signed)
Patient Name: Renee [name]  MRN: [id number]  Epilepsy Attending: Lora Havens  Referring Physician/Provider: Vernelle Emerald, MD  Date: 10/01/2022 Duration: 21.20 mins  Patient history: 89yo F with ams. EEG to evaluate for seizure  Level of alertness: Awake  AEDs during EEG study: None  Technical aspects: This EEG study was done with scalp electrodes positioned according to the 10-20 International system of electrode placement. Electrical activity was reviewed with band pass filter of 1-70Hz$ , sensitivity of 7 uV/mm, display speed of 70m/sec with a 60Hz$  notched filter applied as appropriate. EEG data were recorded continuously and digitally stored.  Video monitoring was available and reviewed as appropriate.  Description: The posterior dominant rhythm consists of 8 Hz activity of moderate voltage (25-35 uV) seen predominantly in posterior head regions, symmetric and reactive to eye opening and eye closing. Hyperventilation and photic stimulation were not performed.     IMPRESSION: This study is within normal limits. No seizures or epileptiform discharges were seen throughout the recording.  [name] [name]

## 2022-10-02 DIAGNOSIS — G9341 Metabolic encephalopathy: Secondary | ICD-10-CM | POA: Diagnosis not present

## 2022-10-02 DIAGNOSIS — J690 Pneumonitis due to inhalation of food and vomit: Secondary | ICD-10-CM | POA: Diagnosis not present

## 2022-10-02 DIAGNOSIS — J9601 Acute respiratory failure with hypoxia: Secondary | ICD-10-CM | POA: Diagnosis not present

## 2022-10-02 DIAGNOSIS — N390 Urinary tract infection, site not specified: Secondary | ICD-10-CM | POA: Diagnosis not present

## 2022-10-02 LAB — CBC WITH DIFFERENTIAL/PLATELET
Abs Immature Granulocytes: 0.06 10*3/uL (ref 0.00–0.07)
Basophils Absolute: 0.1 10*3/uL (ref 0.0–0.1)
Basophils Relative: 1 %
Eosinophils Absolute: 0.3 10*3/uL (ref 0.0–0.5)
Eosinophils Relative: 5 %
HCT: 29 % — ABNORMAL LOW (ref 36.0–46.0)
Hemoglobin: 8.5 g/dL — ABNORMAL LOW (ref 12.0–15.0)
Immature Granulocytes: 1 %
Lymphocytes Relative: 16 %
Lymphs Abs: 0.9 10*3/uL (ref 0.7–4.0)
MCH: 27.1 pg (ref 26.0–34.0)
MCHC: 29.3 g/dL — ABNORMAL LOW (ref 30.0–36.0)
MCV: 92.4 fL (ref 80.0–100.0)
Monocytes Absolute: 0.7 10*3/uL (ref 0.1–1.0)
Monocytes Relative: 12 %
Neutro Abs: 3.8 10*3/uL (ref 1.7–7.7)
Neutrophils Relative %: 65 %
Platelets: 208 10*3/uL (ref 150–400)
RBC: 3.14 MIL/uL — ABNORMAL LOW (ref 3.87–5.11)
RDW: 13.8 % (ref 11.5–15.5)
WBC: 5.9 10*3/uL (ref 4.0–10.5)
nRBC: 0 % (ref 0.0–0.2)

## 2022-10-02 LAB — BLOOD GAS, ARTERIAL
Acid-Base Excess: 9.2 mmol/L — ABNORMAL HIGH (ref 0.0–2.0)
Bicarbonate: 36.7 mmol/L — ABNORMAL HIGH (ref 20.0–28.0)
Drawn by: 29503
O2 Saturation: 99.6 %
Patient temperature: 37
pCO2 arterial: 62 mmHg — ABNORMAL HIGH (ref 32–48)
pH, Arterial: 7.38 (ref 7.35–7.45)
pO2, Arterial: 161 mmHg — ABNORMAL HIGH (ref 83–108)

## 2022-10-02 LAB — GLUCOSE, CAPILLARY
Glucose-Capillary: 106 mg/dL — ABNORMAL HIGH (ref 70–99)
Glucose-Capillary: 115 mg/dL — ABNORMAL HIGH (ref 70–99)
Glucose-Capillary: 140 mg/dL — ABNORMAL HIGH (ref 70–99)
Glucose-Capillary: 152 mg/dL — ABNORMAL HIGH (ref 70–99)
Glucose-Capillary: 165 mg/dL — ABNORMAL HIGH (ref 70–99)
Glucose-Capillary: 177 mg/dL — ABNORMAL HIGH (ref 70–99)
Glucose-Capillary: 182 mg/dL — ABNORMAL HIGH (ref 70–99)
Glucose-Capillary: 190 mg/dL — ABNORMAL HIGH (ref 70–99)
Glucose-Capillary: 215 mg/dL — ABNORMAL HIGH (ref 70–99)
Glucose-Capillary: 224 mg/dL — ABNORMAL HIGH (ref 70–99)
Glucose-Capillary: 238 mg/dL — ABNORMAL HIGH (ref 70–99)
Glucose-Capillary: 26 mg/dL — CL (ref 70–99)
Glucose-Capillary: 34 mg/dL — CL (ref 70–99)

## 2022-10-02 LAB — COMPREHENSIVE METABOLIC PANEL
ALT: 19 U/L (ref 0–44)
AST: 31 U/L (ref 15–41)
Albumin: 2.4 g/dL — ABNORMAL LOW (ref 3.5–5.0)
Alkaline Phosphatase: 160 U/L — ABNORMAL HIGH (ref 38–126)
Anion gap: 9 (ref 5–15)
BUN: 14 mg/dL (ref 8–23)
CO2: 28 mmol/L (ref 22–32)
Calcium: 7.9 mg/dL — ABNORMAL LOW (ref 8.9–10.3)
Chloride: 98 mmol/L (ref 98–111)
Creatinine, Ser: 1.1 mg/dL — ABNORMAL HIGH (ref 0.44–1.00)
GFR, Estimated: 47 mL/min — ABNORMAL LOW (ref >60)
Glucose, Bld: 120 mg/dL — ABNORMAL HIGH (ref 70–99)
Potassium: 3.8 mmol/L (ref 3.5–5.1)
Sodium: 135 mmol/L (ref 135–145)
Total Bilirubin: 0.4 mg/dL (ref 0.3–1.2)
Total Protein: 4.8 g/dL — ABNORMAL LOW (ref 6.5–8.1)

## 2022-10-02 LAB — BASIC METABOLIC PANEL
Anion gap: 10 (ref 5–15)
BUN: 17 mg/dL (ref 8–23)
CO2: 26 mmol/L (ref 22–32)
Calcium: 8.1 mg/dL — ABNORMAL LOW (ref 8.9–10.3)
Chloride: 100 mmol/L (ref 98–111)
Creatinine, Ser: 1.29 mg/dL — ABNORMAL HIGH (ref 0.44–1.00)
GFR, Estimated: 39 mL/min — ABNORMAL LOW (ref >60)
Glucose, Bld: 235 mg/dL — ABNORMAL HIGH (ref 70–99)
Potassium: 3.9 mmol/L (ref 3.5–5.1)
Sodium: 136 mmol/L (ref 135–145)

## 2022-10-02 LAB — TROPONIN I (HIGH SENSITIVITY): Troponin I (High Sensitivity): 13 ng/L (ref <18)

## 2022-10-02 LAB — BRAIN NATRIURETIC PEPTIDE: B Natriuretic Peptide: 166.9 pg/mL — ABNORMAL HIGH (ref 0.0–100.0)

## 2022-10-02 LAB — LACTIC ACID, PLASMA
Lactic Acid, Venous: 0.7 mmol/L (ref 0.5–1.9)
Lactic Acid, Venous: 0.9 mmol/L (ref 0.5–1.9)

## 2022-10-02 LAB — MAGNESIUM: Magnesium: 1.7 mg/dL (ref 1.7–2.4)

## 2022-10-02 LAB — MRSA NEXT GEN BY PCR, NASAL: MRSA by PCR Next Gen: NOT DETECTED

## 2022-10-02 MED ORDER — FAMOTIDINE 20 MG PO TABS
20.0000 mg | ORAL_TABLET | Freq: Two times a day (BID) | ORAL | Status: DC
  Administered 2022-10-02 – 2022-10-08 (×11): 20 mg via ORAL
  Filled 2022-10-02 (×11): qty 1

## 2022-10-02 MED ORDER — LISINOPRIL 10 MG PO TABS
20.0000 mg | ORAL_TABLET | Freq: Every day | ORAL | Status: DC
  Administered 2022-10-02 – 2022-10-04 (×2): 20 mg via ORAL
  Filled 2022-10-02: qty 1
  Filled 2022-10-02: qty 2

## 2022-10-02 MED ORDER — GLUCAGON HCL RDNA (DIAGNOSTIC) 1 MG IJ SOLR
1.0000 mg | INTRAMUSCULAR | Status: AC
  Administered 2022-10-02: 1 mg via INTRAMUSCULAR
  Filled 2022-10-02: qty 1

## 2022-10-02 MED ORDER — CARVEDILOL 3.125 MG PO TABS
3.1250 mg | ORAL_TABLET | Freq: Two times a day (BID) | ORAL | Status: DC
  Administered 2022-10-02 – 2022-10-08 (×10): 3.125 mg via ORAL
  Filled 2022-10-02 (×12): qty 1

## 2022-10-02 MED ORDER — ASPIRIN 325 MG PO TABS
325.0000 mg | ORAL_TABLET | Freq: Every day | ORAL | Status: DC
  Administered 2022-10-02 – 2022-10-08 (×6): 325 mg via ORAL
  Filled 2022-10-02 (×6): qty 1

## 2022-10-02 MED ORDER — FOSFOMYCIN TROMETHAMINE 3 G PO PACK
3.0000 g | PACK | Freq: Once | ORAL | Status: AC
  Administered 2022-10-02: 3 g via ORAL
  Filled 2022-10-02: qty 3

## 2022-10-02 MED ORDER — DEXTROSE 50 % IV SOLN
INTRAVENOUS | Status: AC
  Filled 2022-10-02: qty 50

## 2022-10-02 MED ORDER — CHLORHEXIDINE GLUCONATE CLOTH 2 % EX PADS
6.0000 | MEDICATED_PAD | Freq: Every day | CUTANEOUS | Status: DC
  Administered 2022-10-02 – 2022-10-04 (×3): 6 via TOPICAL

## 2022-10-02 MED ORDER — INSULIN ASPART 100 UNIT/ML IJ SOLN
0.0000 [IU] | Freq: Three times a day (TID) | INTRAMUSCULAR | Status: DC
  Administered 2022-10-02: 2 [IU] via SUBCUTANEOUS

## 2022-10-02 NOTE — TOC Progression Note (Addendum)
Transition of Care [facility]) - Progression Note    Patient Details  Name: Renee [name] MRN: [id number] Date of Birth: 1929/07/10  [facility] CM/SW Contact  Brayen Bunn, Juliann Pulse, RN Phone Number: 10/02/2022, 1:40 PM  Clinical Narrative:  I have left a message w/[facility] [place] [phone] message Interim Legal Guardian. Supv notified/MD notified. -2p  leaving another message on [name] another tel# given#[phone].  -Hearing for guardianship scheduled on 10/16/22;MD/TOC & stafff awaiting response from [name] to confirm who is allowed to receive medical updates.MD updated. -2:30p I have spoken to Jordan Hawks tel#[phone] will contact the APS worker to call me. I would recommend to not talk to anyone in rm until this is evaluated.    Expected Discharge Plan: [place] Barriers to Discharge: Continued Medical Work up  Expected Discharge Plan and Services   Discharge Planning Services: CM Consult Post Acute Care Choice: Durable Medical Equipment, Resumption of Svcs/PTA Provider ([place] w/ [facility]; Home oxygen w/ [employer]; walker, BSC, shower chair) Living arrangements for the past 2 months: Single Family Home                   DME Agency: [employer] Date DME Agency Contacted: 09/27/22 Time DME Agency Contacted: [id number] Representative spoke with at DME Agency: Brenton Grills HH Arranged: [place]: [place] Date [place]: 09/27/22 Time [place]: [phone] Representative spoke with at [place]: [place] Determinants of Health ([place]) Interventions [place]: No Food Insecurity (09/27/2022)  Housing: [place]  (09/27/2022)  Transportation Needs: No Transportation Needs (09/27/2022)  Utilities: Not At Risk (09/27/2022)  Alcohol Screen: Low Risk  (05/30/2022)  Depression (PHQ2-9): Low Risk  (05/30/2022)  Financial Resource Strain: Low Risk  (05/30/2022)   Physical Activity: Inactive (05/30/2022)  Social Connections: Unknown (05/30/2022)  Stress: No Stress Concern Present (05/30/2022)  Tobacco Use: Low Risk  (10/01/2022)    Readmission Risk Interventions    09/27/2022    2:24 PM 06/10/2020    4:24 PM  Readmission Risk Prevention Plan  Transportation Screening Complete   PCP or Specialist Appt within 3-5 Days Complete   HRI or [place] Complete   Social Work Consult for [place] Planning/Counseling [place] Not Applicable   Medication Review Press photographer) Complete   PCP or Specialist appointment within 3-5 days of discharge  Complete

## 2022-10-02 NOTE — Care Management Important Message (Signed)
Important Message  Patient Details IM Letter given. Name: Renee [name] MRN: [id number] Date of Birth: 09/13/1928   Medicare Important Message Given:  Yes     Kerin Salen 10/02/2022, 1:09 PM

## 2022-10-02 NOTE — Progress Notes (Signed)
PROGRESS NOTE   Renee [name]  [id number] DOB: [birth year] DOA: 09/26/2022 PCP: Girtha Rm, NP-C   Date of Service: the patient was seen and examined on 10/02/2022  Brief Narrative:  89 y.o. female with medical history significant for prior stroke with left-sided hemiparesis and dysphagia, chronic diastolic CHF (echo [id number] EF 65-70% with G2DD), nocturnal hypoxemia, recent diagnosis of UTI and on antibiotics, history of CVA with left-sided weakness, laryngopharyngeal reflux disease, pharyngoesophageal dysphagia, bilateral chronic otitis media with effusion status post prior ear tube placement, physical deconditioning, chronic anxiety/depression, mobility impairment, chronic respiratory failure with hypoxia, who presented to [facility] ED from her PCP office due to lethargy.  Of note, patient had a recent fall at home resulting in low back pain.  Furthermore, the patient had recently been diagnosed with a urinary tract infection on 2/3 via urgent care with urine cultures growing out Proteus.  Patient has been treated in the outpatient setting with Keflex and Levaquin.  Upon evaluation in the emergency department patient clinically was felt to be suffering from acute cardiogenic volume overload.  A one-time dose of intravenous Lasix was administered.  Due to patient's continued confusion the hospitalist group was then called to assess the patient for admission the hospital.  Upon admission to the hospital encephalopathy workup was expanded.  Patient was initially placed on intravenous antibiotics with ceftriaxone and Flagyl for suspected aspiration pneumonia as well as recent diagnosis of Proteus urinary tract infection on 2/3.  Repeat urine culture on 2/7 grew out Enterococcus sensitive to linezolid and therefore antibiotic regimen was transitioned to a combination of intravenous Unasyn and linezolid.   Hospital course has been complicated by multiple episodes of aspiration and progressive hypoxic  respiratory failure.     Assessment and Plan: * Acute metabolic encephalopathy Improving mentation Suspected etiology is multiple sources of infection including aspiration pneumonia  Continuing treatment with Unasyn Patient's is likely approaching her baseline mentation as patient is awake alert and oriented and able to follow commands and participate in conversations about her care taking into account patient somewhat slurred speech likely secondary to her old stroke. EEG unremarkable MRI brain obtained reveals no evidence of acute stroke or mass occupying lesion TSH, VBG, ammonia, vitamin B 12 level, UDS all unrevealing   Complicated UTI (urinary tract infection) Urine culture on 2/3 growing out Proteus mirabilis.   Urine culture on 2/7 growing out VRE Enterococcus faecium Patient received cefepime initially, later transitioned to Unasyn and linezolid Reviewed cultures today with infectious disease pharmacist.  Enterococcus is likely due to colonization and not true infection considering how few colonies grew out on culture.   That being said patient is already likely received enough treatment for potential urinary tract infections and therefore a dose of fosfomycin is being provided and linezolid is being discontinued.     Acute respiratory failure with hypoxia (HCC) Acute hypoxic respiratory failure secondary to aspiration pneumonia  Attempting to wean patient off of supplemental oxygen today Continuing to suction patient as needed Currently weaning supplemental oxygen maintain oxygen saturations of 92% and up Treating underlying pneumonia with Unasyn as noted above as well as intermittent bouts of intravenous diuretics, bronchodilator therapy and frequent suctioning  Aspiration pneumonia of both lower lobes due to gastric secretions (HCC) Multiple witnessed episodes of aspiration for the first several days of hospitalization Suspect patient has been aspirating intermittently for  months Multifocal infiltrates seen on chest imaging on this hospitalization Continuing treatment with intravenous Unasyn, will likely treat for 7 days  Dysphagia diet orders placed based on MBS results on 2/12 Blood cultures remain without growth   Dysphagia Multiple witnessed episodes of aspiration for the first several days of this hospitalization Suspect patient has been aspirating intermittently for months Suspect MBS performed on 2/12 Patient is now receiving a dysphagia 1 diet with nectar thickened liquids as well as aspiration precautions and administration of liquids using a Provale cup.   Acute on chronic diastolic CHF (congestive heart failure) (HCC) Patient's respiratory failure thought to be at least in part due to some degree of acute on chronic diastolic congestive heart failure Intermittent doses of intravenous Lasix being administered Last echocardiogram reveals preserved ejection fraction with grade 2 diastolic dysfunction   Type 2 diabetes mellitus with stage 3b chronic kidney disease, without long-term current use of insulin (Lupus) During first several days of hospitalization control was quite brittle however glycemic control seems to have improved greatly in the past 24 to 48 hours. Now, continuing basal bolus insulin therapy to 5 units of NovoLog before every meal and 12 units of Semglee daily Accu-Cheks before every meal and nightly with sliding scale insulin Hemoglobin A1c 9.7% Patient's numerous oral hypoglycemics have been held.  This type of patient would best be served by insulin and not long-acting oral hypoglycemics.  Closed fracture of two ribs, right, initial encounter Unclear etiology, patient does not recall any specific trauma that would have caused this Physical exam unremarkable. No other clinical evidence of physical injury or abuse on exam Supportive care Will obtain vitamin D level  Chronic kidney disease, stage 3b (HCC) Strict intake and  output monitoring Creatinine near baseline Minimizing nephrotoxic agents as much as possible Serial chemistries to monitor renal function and electrolytes   Essential hypertension Relaxed blood pressure targets due to advanced age. Continue amlodipine 10 mg daily As needed intravenous antihypertensives for markedly elevated blood pressures.  GERD without esophagitis Continuing home regimen of daily PPI therapy.   Hyperkalemia Resolved    Subjective:  Patient states that she is feeling well.  Patient denies chest pain cough or shortness of breath.  Patient denies shortness of breath or pain.  Physical Exam:  Vitals:   10/02/22 1945 10/02/22 2000 10/02/22 2100 10/02/22 2151  BP:  (!) 140/58 (!) 110/33   Pulse:  (!) 50 (!) 54   Resp:  18 19   Temp: (!) 93 F (33.9 C)   (!) 94.3 F (34.6 C)  TempSrc: Rectal   Rectal  SpO2:  100% 100%   Weight:      Height:        Constitutional: Arousable, oriented, not in any acute distress. Skin: no rashes, no lesions, poor skin turgor noted. Eyes: Right pupil is minimally reactive due to prior cataract surgery.  No evidence of scleral icterus or conjunctival pallor.  ENMT: Moist mucous membranes noted.  Posterior pharynx clear of any exudate or lesions.   Respiratory: Bibasilar rales noted without any evidence of wheezing.  Improved air entry compared to days prior.  No evidence of increased respiratory effort or accessory muscle use.   Cardiovascular: Regular rate and rhythm, no murmurs / rubs / gallops.  Dependent edema of the left extremities.  2+ pedal pulses. No carotid bruits.  Abdomen: Abdomen is soft and nontender.  No evidence of intra-abdominal masses.  Positive bowel sounds noted in all quadrants.   Musculoskeletal: Poor muscle tone noted. Neuro: Patient experiences baseline severe left-sided weakness.  Strength of the left upper and lower extremities is 2 out  of 5 in the distal and proximal muscle groups.  Sensation grossly  intact.  Patient now following commands.  Patient now oriented x 3.   Data Reviewed:  I have personally reviewed and interpreted labs, imaging.  Significant findings are   CBC: Recent Labs  Lab 09/26/22 1533 09/26/22 2350 09/28/22 0357 09/29/22 0540 09/30/22 0500 10/01/22 0424 10/02/22 0503  WBC 8.5   < > 16.9* 13.9* 14.6* 8.7 5.9  NEUTROABS 6.8  --   --  12.0* 12.7* 6.6 3.8  HGB 9.9*   < > 8.6* 8.8* 9.1* 8.7* 8.5*  HCT 33.1*   < > 29.1* 29.6* 31.4* 28.9* 29.0*  MCV 93.2   < > 93.9 92.5 95.4 92.6 92.4  PLT 207   < > 201 208 212 197 208   < > = values in this interval not displayed.   Basic Metabolic Panel: Recent Labs  Lab 09/27/22 0433 09/27/22 1032 09/28/22 0357 09/28/22 1224 09/29/22 0540 09/30/22 0500 10/01/22 0424 10/02/22 0503 10/02/22 1859  NA 137 135 131* 132* 137 130* 133* 135 136  K 3.0* 4.6 5.4* 4.6 4.9 4.9 4.5 3.8 3.9  CL 99 95* 97* 101 104 98 100 98 100  CO2 26 27 23 22 22 22 25 28 26 $  GLUCOSE 95 223* 360* 217* 115* 290* 149* 120* 235*  BUN 22 22 24* 22 23 22 17 14 17  $ CREATININE 1.27* 1.27* 1.50* 1.42* 1.09* 1.32* 1.21* 1.10* 1.29*  CALCIUM 8.7* 8.5* 7.7* 7.9* 8.0* 7.9* 8.1* 7.9* 8.1*  MG 1.9  --   --   --  1.8 1.8 1.7 1.7  --   PHOS 1.9* 4.5 4.0 2.9  --   --   --   --   --    GFR: Estimated Creatinine Clearance: 27.3 mL/min (A) (by C-G formula based on SCr of 1.29 mg/dL (H)). Liver Function Tests: Recent Labs  Lab 09/26/22 1533 09/28/22 1224 09/29/22 0540 09/30/22 0500 10/01/22 0424 10/02/22 0503  AST 26  --  22 24 33 31  ALT 25  --  20 20 19 19  $ ALKPHOS 324*  --  211* 221* 170* 160*  BILITOT 0.8  --  0.3 0.7 0.6 0.4  PROT 6.7  --  5.2* 5.4* 5.1* 4.8*  ALBUMIN 3.6 2.8* 2.6* 2.7* 2.7* 2.4*     Code Status:  Full code.      Severity of Illness:  The appropriate patient status for this patient is INPATIENT. Inpatient status is judged to be reasonable and necessary in order to provide the required intensity of service to ensure the  patient's safety. The patient's presenting symptoms, physical exam findings, and initial radiographic and laboratory data in the context of their chronic comorbidities is felt to place them at high risk for further clinical deterioration. Furthermore, it is not anticipated that the patient will be medically stable for discharge from the hospital within 2 midnights of admission.   * I certify that at the point of admission it is my clinical judgment that the patient will require inpatient hospital care spanning beyond 2 midnights from the point of admission due to high intensity of service, high risk for further deterioration and high frequency of surveillance required.*  Time spent:  56 minutes  Author:  Vernelle Emerald MD  10/02/2022 10:21 PM

## 2022-10-02 NOTE — Progress Notes (Signed)
Physical Therapy Treatment Patient Details Name: Renee [name] MRN: [id number] DOB: May 23, 1929 Today's Date: 10/02/2022   History of Present Illness Renee [name] is a 89 y.o. female admitted with acute metabolic encephalopathy. CT head with no acute intracranial abnormality. Noted significant blood sugar fluctuation tPMH: diabetes, CKD, CHF, previous stroke with chronic left-sided weakness, HTN, hyperlipidemia, and currently on antibiotics for urinary tract infection    PT Comments    Patient making good progress with mobility today and lethargic at start of session but easily becomes more alert with auditory stimuli. Patient able to follow commands/cues today to initiate bed mobility and use bed rail to facilitate roll/trunk press up. Max +2 required for safety with supine<>sit. Pt initiated standing today and was able to power up with [facility] and mod assist. Blocking for LE's to maintain upright standing and balance and assist needed for weight shift Rt/Lt. Pt was fatigued at EOS and returned to supine in bed. Repositioned with pillows to maintain neutral/midline neck position and elevate bil UE's for edema control.   Recommendations for follow up therapy are one component of a multi-disciplinary discharge planning process, led by the attending physician.  Recommendations may be updated based on patient status, additional functional criteria and insurance authorization.  Follow Up Recommendations  Skilled nursing-short term rehab (<3 hours/day) Can patient physically be transported by private vehicle: No   Assistance Recommended at Discharge Frequent or constant Supervision/Assistance  Patient can return home with the following Two people to help with walking and/or transfers;Two people to help with bathing/dressing/bathroom;Assistance with cooking/housework;Assist for transportation;Help with stairs or ramp for entrance;Direct supervision/assist for medications management;Direct  supervision/assist for financial management   Equipment Recommendations  None recommended by PT    Recommendations for Other Services       Precautions / Restrictions Precautions Precautions: Fall Precaution Comments: LUE weak 2* prior CVA Restrictions Weight Bearing Restrictions: No     Mobility  Bed Mobility Overal bed mobility: Needs Assistance Bed Mobility: Supine to Sit, Sit to Supine     Supine to sit: Max assist, HOB elevated Sit to supine: +2 for physical assistance, +2 for safety/equipment, Max assist   General bed mobility comments: Pt initaiting movement to EOB with bil LE's and followed cues to reach for bed rail to initaite roll/raising trunk. ultimately Max assist required to raise trunk upright and obtain balance. 2+ Max assist to return to supine, pt followed cues to grab bed rail with bil UE's to control lowering trunk to bed.    Transfers Overall transfer level: Needs assistance Equipment used: 2 person hand held assist Transfers: Sit to/from Stand Sit to Stand: Mod assist, +2 safety/equipment, +2 physical assistance           General transfer comment: 2+ Mod assist for power up from EOB, bil LE's blocked by PT and tech to prevent anterior sliding of feet. Pt completed 3x sit<>stand and able to take small side steps along EOB with Mod assist to facilitate weight shift Rt/Lt for stepping.    Ambulation/Gait                   Stairs             Wheelchair Mobility    Modified Rankin (Stroke Patients Only)       Balance Overall balance assessment: Needs assistance Sitting-balance support: Bilateral upper extremity supported, Feet supported Sitting balance-Leahy Scale: Good   Postural control: Right lateral lean, Posterior lean Standing balance support: During functional activity,  Bilateral upper extremity supported Standing balance-Leahy Scale: Fair                              Cognition Arousal/Alertness:  Lethargic, Awake/alert Behavior During Therapy: Flat affect Overall Cognitive Status: Impaired/Different from baseline Area of Impairment: Orientation, Attention, Memory, Following commands, Safety/judgement, Awareness, Problem solving                 Orientation Level: Disoriented to, Situation, Time Current Attention Level: Sustained   Following Commands: Follows one step commands inconsistently, Follows one step commands with increased time   Awareness: Intellectual Problem Solving: Slow processing, Difficulty sequencing, Requires verbal cues, Requires tactile cues General Comments: lethargic but awakens with verbal/tactile stimulii; nephew in room and pt able to recognize them, able to follow commands with increased time        Exercises      General Comments        Pertinent Vitals/Pain Pain Assessment Pain Assessment: Faces Faces Pain Scale: No hurt Pain Intervention(s): Limited activity within patient's tolerance, Monitored during session, Repositioned    Home Living                          Prior Function            PT Goals (current goals can now be found in the care plan section) Acute Rehab PT Goals PT Goal Formulation: With family Time For Goal Achievement: 10/11/22 Potential to Achieve Goals: Fair Progress towards PT goals: Progressing toward goals    Frequency    Min 2X/week      PT Plan Current plan remains appropriate    Co-evaluation              AM-PAC PT "6 Clicks" Mobility   Outcome Measure  Help needed turning from your back to your side while in a flat bed without using bedrails?: A Lot Help needed moving from lying on your back to sitting on the side of a flat bed without using bedrails?: Total Help needed moving to and from a bed to a chair (including a wheelchair)?: Total Help needed standing up from a chair using your arms (e.g., wheelchair or bedside chair)?: Total Help needed to walk in hospital room?:  Total Help needed climbing 3-5 steps with a railing? : Total 6 Click Score: 7    End of Session Equipment Utilized During Treatment: Oxygen;Gait belt Activity Tolerance: Patient tolerated treatment well Patient left: in bed;with call bell/phone within reach;with family/visitor present;with bed alarm set Nurse Communication: Mobility status PT Visit Diagnosis: Other abnormalities of gait and mobility (R26.89);Muscle weakness (generalized) (M62.81);Difficulty in walking, not elsewhere classified (R26.2)     Time: [id number] PT Time Calculation (min) (ACUTE ONLY): 23 min  Charges:  $Therapeutic Activity: 8-22 mins                     Verner Mould, DPT Acute Rehabilitation Services Office [phone]  10/02/22 11:53 AM

## 2022-10-02 NOTE — Progress Notes (Signed)
SLP Cancellation Note  Patient Details Name: BIJAL KIEHL MRN: [id number] DOB: 05/20/1929   Cancelled treatment:       Reason Eval/Treat Not Completed: Other (comment);Medical issues which prohibited therapy (Patient now transferred to [place] - for hypoglycemia - She is  NPO at this time.)  Kathleen Lime, MS [place] Office [phone]   Macario Golds 10/02/2022, 6:34 PM

## 2022-10-02 NOTE — Progress Notes (Addendum)
HOSPITAL MEDICINE EVENT NOTE    Alerted by nursing at 4:30 PM that patient was quickly becoming more more lethargic and diaphoretic and not following commands.  At 4:36 PM I responded to the bedside and concurred with the nurses assessment that the patient was lethargic although she had no but and so focal neurologic deficit.  I asked for stat CBG which revealed blood sugar of 26.  2 doses of glucagon were administered followed by initiation of an amp of D50.  After 15 minutes of monitoring patient was still slow to respond and the decision was made to transfer the patient to the intensive care unit for close clinical monitoring including serial neurologic checks and q1hr blood sugar checks.  Sliding scale insulin as well as scheduled basal bolus insulin therapy has been discontinued temporarily.    ABG was additionally obtained revealing an elevated pCO2 but normalized pH 7.38.  Chemistry was obtained and essentially unremarkable.  Lactic acid and blood cultures have additionally been obtained.  Will keep patient n.p.o. for now to monitor for improvement in mentation.  If mentation fails to substantially improve consider CT or MRI imaging of the head.  Vernelle Emerald  MD [facility]

## 2022-10-02 NOTE — Progress Notes (Signed)
Care resumed for this patient at 1100. Agree with previous assessment.

## 2022-10-02 NOTE — Progress Notes (Signed)
Hypoglycemic Event  CBG: 26  Treatment: D50 50 mL (25 gm) and Glucagon IM 1 mg  Symptoms: Sweaty, Shaky, and Nervous/irritable  Follow-up CBG: Time:1653 CBG Result:34 Follow UP CBG:   1701   CBG:  238  Possible Reasons for Event: Inadequate meal intake  Comments/MD notified:  MD, charge nurse, and rapid notified and at bedside.     Candie Mile

## 2022-10-02 NOTE — Significant Event (Signed)
Rapid Response Event Note   Reason for Call :  Alerted mental, BP 200s  Initial Focused Assessment:  Patient laying in bed, unresponsive, labored breathing, diaphoretic. HR NSR 59-80, BP now down in 140s. SpO2 85%, increased 2L to 6L [place]. SpO2 97%. Shalhoub MD already at bedside on arrival. CBG 26. IV malpositioned, IM Glucagon ordered per protocol-Pharmacy called to verify and pulled immediately once available in pyxis and administered- See MAR for time, repeat CBG 34. IV team at bedside STAT for Ultrasound IV placement- D 50 administered immediately upon gaining IV access. Patient began to move more post D50 administration. Transferred to [place] for closer monitoring.   On arrival to [place], patient opens eyes and tracks on command. Able to state her name and year. Slight slur to speech, but patient does have history of stroke. Confirmed with MD that minor slur was not new finding.    Interventions:  IM glucagon, D50, TX SD   Event Summary:   MD NotifiedCyd Silence MD Call Time: [place] Time: [id number] End Time: [place], RN

## 2022-10-03 DIAGNOSIS — G9341 Metabolic encephalopathy: Secondary | ICD-10-CM | POA: Diagnosis not present

## 2022-10-03 LAB — COMPREHENSIVE METABOLIC PANEL
ALT: 25 U/L (ref 0–44)
AST: 38 U/L (ref 15–41)
Albumin: 3 g/dL — ABNORMAL LOW (ref 3.5–5.0)
Alkaline Phosphatase: 186 U/L — ABNORMAL HIGH (ref 38–126)
Anion gap: 10 (ref 5–15)
BUN: 21 mg/dL (ref 8–23)
CO2: 27 mmol/L (ref 22–32)
Calcium: 8.1 mg/dL — ABNORMAL LOW (ref 8.9–10.3)
Chloride: 99 mmol/L (ref 98–111)
Creatinine, Ser: 1.25 mg/dL — ABNORMAL HIGH (ref 0.44–1.00)
GFR, Estimated: 41 mL/min — ABNORMAL LOW (ref >60)
Glucose, Bld: 110 mg/dL — ABNORMAL HIGH (ref 70–99)
Potassium: 3.6 mmol/L (ref 3.5–5.1)
Sodium: 136 mmol/L (ref 135–145)
Total Bilirubin: 0.5 mg/dL (ref 0.3–1.2)
Total Protein: 5.8 g/dL — ABNORMAL LOW (ref 6.5–8.1)

## 2022-10-03 LAB — CBC WITH DIFFERENTIAL/PLATELET
Abs Immature Granulocytes: 0.06 10*3/uL (ref 0.00–0.07)
Basophils Absolute: 0 10*3/uL (ref 0.0–0.1)
Basophils Relative: 1 %
Eosinophils Absolute: 0.1 10*3/uL (ref 0.0–0.5)
Eosinophils Relative: 2 %
HCT: 32.4 % — ABNORMAL LOW (ref 36.0–46.0)
Hemoglobin: 9.5 g/dL — ABNORMAL LOW (ref 12.0–15.0)
Immature Granulocytes: 1 %
Lymphocytes Relative: 12 %
Lymphs Abs: 0.8 10*3/uL (ref 0.7–4.0)
MCH: 27.2 pg (ref 26.0–34.0)
MCHC: 29.3 g/dL — ABNORMAL LOW (ref 30.0–36.0)
MCV: 92.8 fL (ref 80.0–100.0)
Monocytes Absolute: 0.6 10*3/uL (ref 0.1–1.0)
Monocytes Relative: 9 %
Neutro Abs: 4.8 10*3/uL (ref 1.7–7.7)
Neutrophils Relative %: 75 %
Platelets: 275 10*3/uL (ref 150–400)
RBC: 3.49 MIL/uL — ABNORMAL LOW (ref 3.87–5.11)
RDW: 13.8 % (ref 11.5–15.5)
WBC: 6.4 10*3/uL (ref 4.0–10.5)
nRBC: 0 % (ref 0.0–0.2)

## 2022-10-03 LAB — GLUCOSE, CAPILLARY
Glucose-Capillary: 117 mg/dL — ABNORMAL HIGH (ref 70–99)
Glucose-Capillary: 125 mg/dL — ABNORMAL HIGH (ref 70–99)
Glucose-Capillary: 134 mg/dL — ABNORMAL HIGH (ref 70–99)
Glucose-Capillary: 213 mg/dL — ABNORMAL HIGH (ref 70–99)
Glucose-Capillary: 222 mg/dL — ABNORMAL HIGH (ref 70–99)
Glucose-Capillary: 237 mg/dL — ABNORMAL HIGH (ref 70–99)
Glucose-Capillary: 239 mg/dL — ABNORMAL HIGH (ref 70–99)
Glucose-Capillary: 85 mg/dL (ref 70–99)
Glucose-Capillary: 95 mg/dL (ref 70–99)

## 2022-10-03 LAB — MAGNESIUM: Magnesium: 1.8 mg/dL (ref 1.7–2.4)

## 2022-10-03 MED ORDER — SENNOSIDES-DOCUSATE SODIUM 8.6-50 MG PO TABS: 1.0000 | ORAL_TABLET | Freq: Every evening | ORAL | Status: DC | PRN

## 2022-10-03 MED ORDER — DEXTROSE-NACL 10-0.45 % IV SOLN
INTRAVENOUS | Status: DC
  Filled 2022-10-03 (×2): qty 1000

## 2022-10-03 MED ORDER — GUAIFENESIN 100 MG/5ML PO LIQD: 5.0000 mL | ORAL | Status: DC | PRN

## 2022-10-03 MED ORDER — ORAL CARE MOUTH RINSE: 15.0000 mL | OROMUCOSAL | Status: DC | PRN

## 2022-10-03 MED ORDER — GLUCERNA SHAKE PO LIQD
237.0000 mL | Freq: Three times a day (TID) | ORAL | Status: DC
  Administered 2022-10-04 – 2022-10-08 (×7): 237 mL via ORAL
  Filled 2022-10-03 (×15): qty 237

## 2022-10-03 MED ORDER — ONDANSETRON HCL 4 MG/2ML IJ SOLN: 4.0000 mg | Freq: Four times a day (QID) | INTRAMUSCULAR | Status: DC | PRN

## 2022-10-03 MED ORDER — TRAZODONE HCL 50 MG PO TABS
50.0000 mg | ORAL_TABLET | Freq: Every evening | ORAL | Status: DC | PRN
  Administered 2022-10-03 – 2022-10-06 (×4): 50 mg via ORAL
  Filled 2022-10-03 (×4): qty 1

## 2022-10-03 MED ORDER — HYDRALAZINE HCL 20 MG/ML IJ SOLN
10.0000 mg | INTRAMUSCULAR | Status: DC | PRN
  Administered 2022-10-04 – 2022-10-07 (×5): 10 mg via INTRAVENOUS
  Filled 2022-10-03 (×5): qty 1

## 2022-10-03 MED ORDER — ORAL CARE MOUTH RINSE
15.0000 mL | OROMUCOSAL | Status: DC
  Administered 2022-10-03 – 2022-10-08 (×22): 15 mL via OROMUCOSAL

## 2022-10-03 MED ORDER — METOPROLOL TARTRATE 5 MG/5ML IV SOLN: 5.0000 mg | INTRAVENOUS | Status: DC | PRN

## 2022-10-03 NOTE — Progress Notes (Addendum)
Speech Language Pathology Treatment: Dysphagia  Patient Details Name: Renee [name] MRN: [id number] DOB: Sep 30, 1928 Today's Date: 10/03/2022 Time: 1105-1130 SLP Time Calculation (min) (ACUTE ONLY): 25 min  Assessment / Plan / Recommendation Clinical Impression  Pt seen to assess po readiness after hypoglycemic event on 10/02/2021 prompting transfer to [facility].  Today pt is fully alert and willing to consume po intake.    She appears with dense right facial asymmetry - that appears more pronounced than during SLP initial evaluation last week and her EPIC picture *08/2020* did not show dense facial asymmetry but this was BEFORE most recent CVA.  SLP may not have been able to see this on prior BSE due to her appearance of tardive dyskinesia.   Pt able to brush her teeth with her right hand - without increased work of breathing however BP increased.  Assisted pt to consume po intake including ice chips, thin via tsp, nectar thick via tsp, cup and straw, applesauce and crackers.  Pt with very prolonged mastication of solids - not eliciting swallow over approximately 40 seconds with soft moist bite - followed by overt cough before swallow triggered.  SLP provided applesauce and nectar to help clear - but this continued to result in cough.  Solid consumption is highly concerning for potential airway infiltration/aspiration. No indication of aspiration with ice, thin tsp, nectar via tsp,cup nor applesauce but swallow is very delayed.  Feeding pt will be laborious due to extent of delay - she will benefit from full  supervision initially.   Pt remains high aspiration and malnutrition risk due to her dysphagia.  Recommend full liquid diet nectar initially due to concerns also for potential esophageal dysmotility per prior MBS 04/2022   Most recent MBS showed audible asp of thin via cup and straw due to poor airway closure, with recommendation for nectar and puree.   Will follow for dysphagia management and education.       HPI HPI: Renee [name] is a 89 y.o. female with medical history significant for chronic diastolic CHF, nocturnal hypoxemia, recent diagnosis of UTI and on antibiotics, history of CVA with left-sided weakness, laryngopharyngeal reflux disease, pharyngoesophageal dysphagia, bilateral chronic otitis media with effusion status post prior ear tube placement, physical deconditioning, chronic anxiety/depression, mobility impairment, chronic respiratory failure with hypoxia, who presented to [facility] ED from her PCP due to lethargy and hypersomnolence. 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 Pna vs atx, ct brain negative for acute, Stable encephalomalacia within the right basal ganglia consistent with prior infarct. pt coughing with po      SLP Plan  Continue with current plan of care      Recommendations for follow up therapy are one component of a multi-disciplinary discharge planning process, led by the attending physician.  Recommendations may be updated based on patient status, additional functional criteria and insurance authorization.    Recommendations  Diet recommendations: Nectar-thick liquid;Other(comment) (thin via tsp, full liquids) Liquids provided via: Cup;Straw (tsp via thin) Medication Administration: Other (Comment) (whole if small, crushed if large and not contraindicated) Compensations: Slow rate;Small sips/bites;Other (Comment);Minimize environmental distractions (try small frequent meals) Postural Changes and/or Swallow Maneuvers: Upright 30-60 min after meal;Seated upright 90 degrees                Oral Care Recommendations: Oral care BID Follow Up Recommendations: Skilled nursing-short term rehab (<3 hours/day) SLP Visit Diagnosis: Dysphagia, oropharyngeal phase (R13.12) Plan:  Continue with current plan of care          Renee Lime, MS [place] Office  [phone]  Renee [name]  10/03/2022, 11:44 AM

## 2022-10-03 NOTE — Progress Notes (Signed)
PROGRESS NOTE    Renee [name]  [id number] DOB: [birth year] DOA: 09/26/2022 PCP: Girtha Rm, NP-C   Brief Narrative:  89 year old with history of previous CVA with left-sided hemiparesis and dysphagia, chronic diastolic CHF EF [id number] with grade 2 DD, chronic nocturnal hypoxia, recent UTI on antibiotics, laryngeal reflux disease, chronic otitis media with effusion status post ear tube placement, physical deconditioning, depression/anxiety, mobility impairment, sent from PCP office for lethargy.  Prior to admission went to urgent care on 2/3 and found to have Proteus UTI treated with Keflex and Levaquin.  In the ED was noted to have acute CHF requiring IV Lasix and patient was admitted.  During hospitalization patient was noted to have recurrent aspiration requiring antibiotic treatment.  Also was diagnosed with Enterococcus urinary tract infection initially required linezolid thereafter 1 dose of fosfomycin.  During this time patient has been noted to be incompetent therefore court has assigned a guardianship.  Case has been filed against previous caretaker for APS.   Assessment & Plan:  Principal Problem:   Acute metabolic encephalopathy Active Problems:   Acute respiratory failure with hypoxia (HCC)   Complicated UTI (urinary tract infection)   Aspiration pneumonia of both lower lobes due to gastric secretions (HCC)   Dysphagia   Acute on chronic diastolic CHF (congestive heart failure) (HCC)   Type 2 diabetes mellitus with stage 3b chronic kidney disease, without long-term current use of insulin (HCC)   Closed fracture of two ribs, right, initial encounter   Chronic kidney disease, stage 3b ([place])   Essential hypertension   GERD without esophagitis   Hyperkalemia   Hemiplegia, late effect of cerebrovascular disease (HCC)   History of CVA (cerebrovascular accident)   CKD (chronic kidney disease) stage 4, GFR 15-29 ml/min (HCC)   Asthma, chronic, unspecified asthma severity, with  acute exacerbation   Obesity (BMI 30-39.9)   Chronic respiratory failure with hypoxia (HCC)     Assessment and Plan: * Acute metabolic encephalopathy -Mentation is fluctuating.  Combination of aspiration pneumonia, UTI and hypoglycemia. - Currently on IV Unasyn. - MRI brain 2/11-negative.  TSH, VBG, ammonia, B12 and UDS negative.  EEG unremarkable - At baseline apparently patient is awake alert oriented and follows basic commands.  Complicated UTI (urinary tract infection) -Urine culture 2/3-Proteus, 2/7-VRE Enterococcus faecium. - On Unasyn, received cefepime/Flagyl then transition to linezolid hide thereafter 1 dose of fosfomycin 2/13  Acute respiratory failure with hypoxia ([place]), from aspiration pneumonia -As needed bronchodilators.  Also receiving intermittent IV Lasix.  Aspiration pneumonia of both lower lobes due to gastric secretions ([place]) Dysphagia -Unfortunately patient has had multiple witnessed episodes of aspiration.  Confirmed by speech and swallow.  MBS performed.  Dysphagia 1 diet.  Cultures remain negative.  Acute on chronic diastolic CHF (congestive heart failure) (HCC) -Echocardiogram 2023 showed EF of 65%, grade 2 DD.  Intermittently receiving IV Lasix.   Type 2 diabetes mellitus with stage 3b chronic kidney disease, without long-term current use of insulin (HCC) Recurrent hypoglycemic episodes -During the hospitalization patient having recurrent hypoglycemic episodes.  Hemoglobin A1c 9.7.  Hold off on any insulin at this time.  Getting D10 half-normal saline but intermittently may require IV Lasix.  Closed fracture of two ribs, right, initial encounter, right 10th and 11th rib -No other obvious physical trauma noted.  Pain control.  Vitamin D normal 1 month ago  Chronic kidney disease, stage 3b (HCC) -Creatinine remains around baseline of 1.2.   Essential hypertension -Currently on Norvasc, Coreg, lisinopril.  Getting IV as needed  GERD without  esophagitis -PPI  As documented by previous provider, there has been issues with previous guardianship and possible APS involvement.  Currently only point of contact is [employer] [phone]. [place].  Ms Granville Lewis is not allowed to visit the patient.   PT/OT-SNF  DVT prophylaxis: Lovenox Code Status: Full code but patient wishes to be DNR Family Communication: Discussed care with [name], court assigned guardian this morning  Status is: Inpatient Continue hospital stay    Subjective: Seen and examined at bedside.  Patient is alert awake oriented X4 this morning.  She is able to answer all the basic questions. Overall judgment and he appears to be poor. I discussed case with patient's court assigned guardian, [name] this morning.  Also patient needs to be DNR and currently wishes to be DNR.  I discussed this with [name] who agrees that patient should be DNR but she will be faxing over DDS DNR form for to physician to sign in and we will have to be notarized before making this official.   Examination:  General exam: Appears calm and comfortable  Respiratory system: Clear to auscultation. Respiratory effort normal. Cardiovascular system: S1 & S2 heard, RRR. No JVD, murmurs, rubs, gallops or clicks. No pedal edema. Gastrointestinal system: Abdomen is nondistended, soft and nontender. No organomegaly or masses felt. Normal bowel sounds heard. Central nervous system: Alert and oriented. No focal neurological deficits. Extremities: Symmetric 4 x 5 power. Skin: No rashes, lesions or ulcers Psychiatry: Judgement and insight appear poor  Objective: Vitals:   10/03/22 0300 10/03/22 0400 10/03/22 0500 10/03/22 0600  BP: (!) 165/34 (!) 150/64 (!) 167/94 (!) 176/48  Pulse: 73 74 77 86  Resp: 15 10 (!) 21 16  Temp:   (!) 96.2 F (35.7 C) 99.3 F (37.4 C)  TempSrc:   Axillary Axillary  SpO2: 100% 97% 98% 100%  Weight:      Height:         Intake/Output Summary (Last 24 hours) at 10/03/2022 0730 Last data filed at 10/03/2022 0600 Gross per 24 hour  Intake 413.25 ml  Output 300 ml  Net 113.25 ml   Filed Weights   10/01/22 0500 10/02/22 0550 10/02/22 1803  Weight: 79.1 kg 74.7 kg 73.7 kg     Data Reviewed:   CBC: Recent Labs  Lab 09/29/22 0540 09/30/22 0500 10/01/22 0424 10/02/22 0503 10/03/22 0252  WBC 13.9* 14.6* 8.7 5.9 6.4  NEUTROABS 12.0* 12.7* 6.6 3.8 4.8  HGB 8.8* 9.1* 8.7* 8.5* 9.5*  HCT 29.6* 31.4* 28.9* 29.0* 32.4*  MCV 92.5 95.4 92.6 92.4 92.8  PLT 208 212 197 208 [id number]   Basic Metabolic Panel: Recent Labs  Lab 09/27/22 0433 09/27/22 1032 09/28/22 0357 09/28/22 1224 09/29/22 0540 09/30/22 0500 10/01/22 0424 10/02/22 0503 10/02/22 1859 10/03/22 0252  NA 137 135 131* 132* 137 130* 133* 135 136 136  K 3.0* 4.6 5.4* 4.6 4.9 4.9 4.5 3.8 3.9 3.6  CL 99 95* 97* 101 104 98 100 98 100 99  CO2 26 27 23 22 22 22 25 28 26 $ 27  GLUCOSE 95 223* 360* 217* 115* 290* 149* 120* 235* 110*  BUN 22 22 24* 22 23 22 17 14 17 21  $ CREATININE 1.27* 1.27* 1.50* 1.42* 1.09* 1.32* 1.21* 1.10* 1.29* 1.25*  CALCIUM 8.7* 8.5* 7.7* 7.9* 8.0* 7.9* 8.1* 7.9* 8.1* 8.1*  MG 1.9  --   --   --  1.8  1.8 1.7 1.7  --  1.8  PHOS 1.9* 4.5 4.0 2.9  --   --   --   --   --   --    GFR: Estimated Creatinine Clearance: 28.2 mL/min (A) (by C-G formula based on SCr of 1.25 mg/dL (H)). Liver Function Tests: Recent Labs  Lab 09/29/22 0540 09/30/22 0500 10/01/22 0424 10/02/22 0503 10/03/22 0252  AST 22 24 33 31 38  ALT 20 20 19 19 25  $ ALKPHOS 211* 221* 170* 160* 186*  BILITOT 0.3 0.7 0.6 0.4 0.5  PROT 5.2* 5.4* 5.1* 4.8* 5.8*  ALBUMIN 2.6* 2.7* 2.7* 2.4* 3.0*   Recent Labs  Lab 09/26/22 1533  LIPASE 24   Recent Labs  Lab 09/26/22 1600  AMMONIA <10   Coagulation Profile: No results for input(s): "INR", "PROTIME" in the last 168 hours. Cardiac Enzymes: No results for input(s): "CKTOTAL", "CKMB", "CKMBINDEX",  "TROPONINI" in the last 168 hours. BNP (last 3 results) Recent Labs    07/04/22 1140 08/24/22 1426  PROBNP 161.0* 298.0*   HbA1C: No results for input(s): "HGBA1C" in the last 72 hours. CBG: Recent Labs  Lab 10/02/22 2331 10/03/22 0121 10/03/22 0351 10/03/22 0500 10/03/22 0623  GLUCAP 140* 125* 85 95 117*   Lipid Profile: No results for input(s): "CHOL", "HDL", "LDLCALC", "TRIG", "CHOLHDL", "LDLDIRECT" in the last 72 hours. Thyroid Function Tests: No results for input(s): "TSH", "T4TOTAL", "FREET4", "T3FREE", "THYROIDAB" in the last 72 hours. Anemia Panel: No results for input(s): "VITAMINB12", "FOLATE", "FERRITIN", "TIBC", "IRON", "RETICCTPCT" in the last 72 hours. Sepsis Labs: Recent Labs  Lab 09/26/22 1533 09/26/22 2005 09/29/22 0540 10/02/22 2047 10/02/22 2200  PROCALCITON  --   --  0.28  --   --   LATICACIDVEN 1.0 1.1  --  0.7 0.9    Recent Results (from the past 240 hour(s))  Blood culture (routine x 2)     Status: None   Collection Time: 09/26/22  3:13 PM   Specimen: BLOOD LEFT HAND  Result Value Ref Range Status   Specimen Description   Final    BLOOD LEFT HAND Performed at [facility], [place] [address], [place], [place] [address]    Special Requests   Final    BOTTLES DRAWN AEROBIC AND ANAEROBIC Blood Culture results may not be optimal due to an inadequate volume of blood received in culture bottles Performed at [place] [address], [place], [place] [address]    Culture   Final    NO GROWTH 5 DAYS Performed at [facility], [place] [address] [place], [place] [address]    Report Status 10/01/2022 FINAL  Final  Blood culture (routine x 2)     Status: None   Collection Time: 09/26/22  3:33 PM   Specimen: BLOOD  Result Value Ref Range Status   Specimen Description   Final    BLOOD RIGHT ANTECUBITAL Performed at [facility] [address] [place], [place] [address]    Special Requests    Final    BOTTLES DRAWN AEROBIC AND ANAEROBIC Blood Culture adequate volume Performed at [place] [address] [place], [place] [address]    Culture   Final    NO GROWTH 5 DAYS Performed at [facility], [place] [address], [place], [place] [address]    Report Status 10/01/2022 FINAL  Final  Urine Culture (for pregnant, neutropenic or urologic patients or patients with an indwelling urinary catheter)  Status: Abnormal   Collection Time: 09/26/22  4:00 PM   Specimen: Urine, Clean Catch  Result Value Ref Range Status   Specimen Description   Final    URINE, CLEAN CATCH Performed at [facility], [place] [address], [place], [place] [address]    Special Requests   Final    NONE Performed at [facility], [place] [address], [place], [place] [address]    Culture (A)  Final    20,000 COLONIES/mL ENTEROCOCCUS FAECIUM VANCOMYCIN RESISTANT ENTEROCOCCUS    Report Status 09/29/2022 FINAL  Final   Organism ID, Bacteria ENTEROCOCCUS FAECIUM (A)  Final      Susceptibility   Enterococcus faecium - MIC*    AMPICILLIN >=32 RESISTANT Resistant     NITROFURANTOIN 64 INTERMEDIATE Intermediate     VANCOMYCIN >=32 RESISTANT Resistant     LINEZOLID 2 SENSITIVE Sensitive     * 20,000 COLONIES/mL ENTEROCOCCUS FAECIUM  Resp panel by RT-PCR (RSV, Flu A&B, Covid) Anterior Nasal Swab     Status: None   Collection Time: 09/26/22  4:39 PM   Specimen: Anterior Nasal Swab  Result Value Ref Range Status   SARS Coronavirus 2 by RT PCR NEGATIVE NEGATIVE Final    Comment: (NOTE) SARS-CoV-2 target nucleic acids are NOT DETECTED.  The SARS-CoV-2 RNA is generally detectable in upper respiratory specimens during the acute phase of infection. The lowest concentration of SARS-CoV-2 viral copies this assay can detect is 138 copies/mL. A negative result does not preclude SARS-Cov-2 infection and should not be used as the sole basis for treatment  or other patient management decisions. A negative result may occur with  improper specimen collection/handling, submission of specimen other than nasopharyngeal swab, presence of viral mutation(s) within the areas targeted by this assay, and inadequate number of viral copies(<138 copies/mL). A negative result must be combined with clinical observations, patient history, and epidemiological information. The expected result is Negative.  Fact Sheet for Patients:  EntrepreneurPulse.com.au  Fact Sheet for Healthcare Providers:  IncredibleEmployment.be  This test is no t yet approved or cleared by the [place] FDA and  has been authorized for detection and/or diagnosis of SARS-CoV-2 by FDA under an Emergency Use Authorization (EUA). This EUA will remain  in effect (meaning this test can be used) for the duration of the COVID-19 declaration under Section 564(b)(1) of the Act, 21 U.S.C.section 360bbb-3(b)(1), unless the authorization is terminated  or revoked sooner.       Influenza A by PCR NEGATIVE NEGATIVE Final   Influenza B by PCR NEGATIVE NEGATIVE Final    Comment: (NOTE) The Xpert Xpress SARS-CoV-2/FLU/RSV plus assay is intended as an aid in the diagnosis of influenza from Nasopharyngeal swab specimens and should not be used as a sole basis for treatment. Nasal washings and aspirates are unacceptable for Xpert Xpress SARS-CoV-2/FLU/RSV testing.  Fact Sheet for Patients: EntrepreneurPulse.com.au  Fact Sheet for Healthcare Providers: IncredibleEmployment.be  This test is not yet approved or cleared by the [place] FDA and has been authorized for detection and/or diagnosis of SARS-CoV-2 by FDA under an Emergency Use Authorization (EUA). This EUA will remain in effect (meaning this test can be used) for the duration of the COVID-19 declaration under Section 564(b)(1) of the Act, 21 U.S.C. section  360bbb-3(b)(1), unless the authorization is terminated or revoked.     Resp Syncytial Virus by PCR NEGATIVE NEGATIVE Final    Comment: (NOTE) Fact Sheet for Patients: EntrepreneurPulse.com.au  Fact Sheet for Healthcare Providers: IncredibleEmployment.be  This test is not yet approved or cleared by the [place] and has been authorized for detection and/or diagnosis of SARS-CoV-2 by FDA under an Emergency Use Authorization (EUA). This EUA will remain in effect (meaning this test can be used) for the duration of the COVID-19 declaration under Section 564(b)(1) of the Act, 21 U.S.C. section 360bbb-3(b)(1), unless the authorization is terminated or revoked.  Performed at [facility], [place] [address] [place] [address] [place], [place] [address]   Respiratory (~20 pathogens) panel by PCR     Status: None   Collection Time: 09/26/22  5:18 PM   Specimen: Nasopharyngeal Swab; Respiratory  Result Value Ref Range Status   Adenovirus NOT DETECTED NOT DETECTED Final   Coronavirus 229E NOT DETECTED NOT DETECTED Final    Comment: (NOTE) The Coronavirus on the Respiratory Panel, DOES NOT test for the novel  Coronavirus (2019 nCoV)    Coronavirus HKU1 NOT DETECTED NOT DETECTED Final   Coronavirus NL63 NOT DETECTED NOT DETECTED Final   Coronavirus OC43 NOT DETECTED NOT DETECTED Final   Metapneumovirus NOT DETECTED NOT DETECTED Final   Rhinovirus / Enterovirus NOT DETECTED NOT DETECTED Final   Influenza A NOT DETECTED NOT DETECTED Final   Influenza B NOT DETECTED NOT DETECTED Final   Parainfluenza Virus 1 NOT DETECTED NOT DETECTED Final   Parainfluenza Virus 2 NOT DETECTED NOT DETECTED Final   Parainfluenza Virus 3 NOT DETECTED NOT DETECTED Final   Parainfluenza Virus 4 NOT DETECTED NOT DETECTED Final   Respiratory Syncytial Virus NOT DETECTED NOT DETECTED Final   Bordetella pertussis NOT DETECTED NOT DETECTED Final   Bordetella Parapertussis  NOT DETECTED NOT DETECTED Final   Chlamydophila pneumoniae NOT DETECTED NOT DETECTED Final   Mycoplasma pneumoniae NOT DETECTED NOT DETECTED Final    Comment: Performed at [facility], [place]. [address] [place], [place] [address]  MRSA Next Gen by PCR, Nasal     Status: None   Collection Time: 10/02/22  5:24 PM   Specimen: Nasal Mucosa; Nasal Swab  Result Value Ref Range Status   MRSA by PCR Next Gen NOT DETECTED NOT DETECTED Final    Comment: (NOTE) The GeneXpert MRSA Assay (FDA approved for NASAL specimens only), is one component of a comprehensive MRSA colonization surveillance program. It is not intended to diagnose MRSA infection nor to guide or monitor treatment for MRSA infections. Test performance is not FDA approved in patients less than 25 years old. Performed at [facility], [place] [address] [place], [place] [address]          Radiology Studies: DG Swallowing Func-Speech Pathology  Result Date: 10/01/2022 Table formatting from the original result was not included. Modified Barium Swallow Study Patient Details Name: BINTOU HORSTMEYER MRN: [id number] Date of Birth: [birth year] Today's Date: 10/01/2022 HPI/PMH: HPI: Deonni Nash Khatoon is a 89 y.o. female with medical history significant for chronic diastolic CHF, nocturnal hypoxemia, recent diagnosis of UTI and on antibiotics, history of CVA with left-sided weakness, laryngopharyngeal reflux disease, pharyngoesophageal dysphagia, bilateral chronic otitis media with effusion status post prior ear tube placement, physical deconditioning, chronic anxiety/depression, mobility impairment, chronic respiratory failure with hypoxia, who presented to [facility] ED from her PCP due to lethargy and hypersomnolence. 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 Pna vs atx, ct brain negative for acute, Stable encephalomalacia within the right basal ganglia  consistent with prior infarct. pt coughing with po  Clinical Impression: Clinical Impression: Patient exhibited an oropharyngeal dysphagia with sensed aspiration of thin liquids when consumed via large cup sip or straw sip. Delays in initiation of swallow were observed at level of the pyriform sinus with nectar thick and thin liquids and delays at level of vallecular sinus with puree solids, mechanical soft solids and honey thick liquids. She exhibited sensed aspiration with large cup sip as well as with straw sip of thin liquids but was ineffective to clear. She exhibited one instance of trace silent aspiration with thin liquids via consecutive cup sips. Oral phase of swallow was moderately impaired with mechanical soft solids, with prolonged mastication and disorganized tongue motion leading to delays in transit of bolus from anterior to posterior portion of oral cavity but with puree solids, bolus transit was only mildly prolonged. Penetration and aspiration events with thin liquids occured secondary to patient with incomplete laryngeal vestibule closure and reduced tongue base retraction. Patient did also exhibit partial obstruction of flow of barium secondary to reduced PES opening but no barium retention observed at this level. Factors that may increase risk of adverse event in presence of aspiration ([place] 2021): Factors that may increase risk of adverse event in presence of aspiration ([place] 2021): Reduced cognitive function; Frail or deconditioned; Weak cough Recommendations/Plan: Swallowing Evaluation Recommendations Swallowing Evaluation Recommendations Recommendations: PO diet PO Diet Recommendation: Mildly thick liquids (Level 2, nectar thick); Dysphagia 1 (Pureed) Liquid Administration via: Cup; Straw Medication Administration: Crushed with puree Supervision: Full assist for feeding; Full supervision/cueing for swallowing strategies Swallowing strategies  : Slow rate; Small  bites/sips; Check for pocketing or oral holding Postural changes: Position pt fully upright for meals Oral care recommendations: Oral care BID (2x/day); Oral care before ice chips/water; Staff/trained caregiver to provide oral care Treatment Plan Treatment Plan Treatment recommendations: Therapy as outlined in treatment plan below Follow-up recommendations: Long-term institutional care without follow up therapy Functional status assessment: Patient has had a recent decline in their functional status and demonstrates the ability to make significant improvements in function in a reasonable and predictable amount of time. Treatment frequency: Min 2x/week Treatment duration: 2 weeks Interventions: Compensatory techniques; Patient/family education; Diet toleration management by SLP; Trials of upgraded texture/liquids Recommendations Recommendations for follow up therapy are one component of a multi-disciplinary discharge planning process, led by the attending physician.  Recommendations may be updated based on patient status, additional functional criteria and insurance authorization. Assessment: Orofacial Exam: Orofacial Exam Oral Cavity: Oral Hygiene: WFL Oral Cavity - Dentition: Adequate natural dentition Orofacial Anatomy: WFL Oral Motor/Sensory Function: WFL Anatomy: Anatomy: WFL Thin Liquids: Thin Liquids (Level 0) Thin Liquids : Impaired Bolus delivery method: Spoon; Cup; Straw Thin Liquid - Impairment: Pharyngeal impairment Initiation of swallow : Pyriform sinuses Soft palate elevation: Complete Laryngeal elevation: Complete superior movement of thyroid cartilage with complete approximation of arytenoids to epiglottic petiole Anterior hyoid excursion: Complete Epiglottic movement: Complete Laryngeal vestibule closure: Incomplete Pharyngeal stripping wave : Present - complete Pharyngoesophageal segment opening: Partial distention/partial duration, partial obstruction of flow Tongue base retraction: Trace column of  contrast or air between tongue base and PPW Pharyngeal residue: Trace residue within or on pharyngeal structures Location of pharyngeal residue: Valleculae Penetration/Aspiration Scale (PAS) score: 8.  Material enters airway, passes BELOW cords without attempt by patient to eject out (silent aspiration); 3.  Material enters airway, remains ABOVE vocal cords and not ejected out  Mildly Thick Liquids: Mildly thick liquids (Level 2, nectar thick) Mildly thick liquids (Level 2,  nectar thick): Impaired Bolus delivery method: Cup; Spoon; Straw Mildly Thick Liquid - Impairment: Pharyngeal impairment Initiation of swallow : Pyriform sinuses Soft palate elevation: Complete Laryngeal elevation: Complete superior movement of thyroid cartilage with complete approximation of arytenoids to epiglottic petiole Anterior hyoid excursion: Complete Epiglottic movement: Complete Laryngeal vestibule closure: Incomplete Pharyngeal stripping wave : Present - complete Pharyngoesophageal segment opening: Partial distention/partial duration, partial obstruction of flow Tongue base retraction: Trace column of contrast or air between tongue base and PPW Pharyngeal residue: Trace residue within or on pharyngeal structures Location of pharyngeal residue: Valleculae Penetration/Aspiration Scale (PAS) score: 1.  Material does not enter airway  Moderately Thick Liquids: Moderately thick liquids (Level 3, honey thick) Moderately thick liquids (Level 3, honey thick): Impaired Bolus delivery method: Spoon Moderately Thick Liquid - Impairment: Pharyngeal impairment Initiation of swallow : Pyriform sinuses Soft palate elevation: Complete Laryngeal elevation: Complete superior movement of thyroid cartilage with complete approximation of arytenoids to epiglottic petiole Anterior hyoid excursion: Complete Epiglottic movement: Complete Laryngeal vestibule closure: Complete: No air/contrast in laryngeal vestibule Pharyngeal stripping wave : Present - complete  Pharyngoesophageal segment opening: Partial distention/partial duration, partial obstruction of flow Tongue base retraction: No contrast between tongue base and posterior pharyngeal wall (PPW) Pharyngeal residue: Trace residue within or on pharyngeal structures Location of pharyngeal residue: Valleculae Penetration/Aspiration Scale (PAS) score: 1.  Material does not enter airway  Puree: Puree Puree: Impaired Puree - Impairment: Oral Impairment; Pharyngeal impairment Lip Closure: No labial escape Bolus transport/lingual motion: Brisk tongue motion Oral residue: Complete oral clearance Location of oral residue : N/A Initiation of swallow: Valleculae Soft palate elevation: Complete Laryngeal elevation: Complete superior movement of thyroid cartilage with complete approximation of arytenoids to epiglottic petiole Anterior hyoid excursion: Complete Epiglottic movement: Complete Laryngeal vestibule closure: Complete: No air/contrast in laryngeal vestibule Pharyngeal stripping wave : Present - complete Pharyngoesophageal segment opening: Partial distention/partial duration, partial obstruction of flow Tongue base retraction: No contrast between tongue base and posterior pharyngeal wall (PPW) Pharyngeal residue: Complete pharyngeal clearance Location of pharyngeal residue: N/A Penetration/Aspiration Scale (PAS) score: 1.  Material does not enter airway Solid: Solid Solid: Impaired Solid - Impairment: Oral Impairment; Pharyngeal impairment Lip Closure: No labial escape Bolus preparation/mastication: Slow prolonged chewing/mashing with complete recollection Bolus transport/lingual motion: Repetitive/disorganized tongue motion Oral residue: Trace residue lining oral structures Location of oral residue : Tongue Initiation of swallow: Valleculae Soft palate elevation: Complete Laryngeal elevation: Complete superior movement of thyroid cartilage with complete approximation of arytenoids to epiglottic petiole Anterior hyoid  excursion: Complete Epiglottic movement: Complete Laryngeal vestibule closure: Complete: No air/contrast in laryngeal vestibule Pharyngeal stripping wave : Present - complete Pharyngoesophageal segment opening: Partial distention/partial duration, partial obstruction of flow Tongue base retraction: No contrast between tongue base and posterior pharyngeal wall (PPW) Pharyngeal residue: Collection of residue within or on pharyngeal structures Location of pharyngeal residue: Valleculae Penetration/Aspiration Scale (PAS) score: 1.  Material does not enter airway Pill: Pill Pill: Not Tested Compensatory Strategies: Compensatory Strategies Compensatory strategies: Yes Straw: Ineffective   General Information: Caregiver present: No  Diet Prior to this Study: NPO   Temperature : Normal   Respiratory Status: WFL   Supplemental O2: Nasal cannula   History of Recent Intubation: No  Behavior/Cognition: Alert; Cooperative; Pleasant mood; Requires cueing Self-Feeding Abilities: Dependent for feeding Baseline vocal quality/speech: Hypophonia/low volume Volitional Cough: Unable to elicit Volitional Swallow: Unable to elicit Exam Limitations: No limitations Goal Planning: Prognosis for improved oropharyngeal function: Fair Barriers to Reach Goals: Overall medical prognosis;  Time post onset; Cognitive deficits No data recorded Patient/Family Stated Goal: patient's great nephew who was present would like patient to be able to have PO's and is in agreement with plan for MBS Consulted and agree with results and recommendations: Family member/caregiver; Physician Pain: Pain Assessment Pain Assessment: Faces Pain Score: 0 Faces Pain Scale: 0 Breathing: 0 Negative Vocalization: 0 Facial Expression: 0 Body Language: 1 Consolability: 0 PAINAD Score: 1 End of Session: Start Time:SLP Start Time (ACUTE ONLY): 1230 Stop Time: SLP Stop Time (ACUTE ONLY): 1300 Time Calculation:SLP Time Calculation (min) (ACUTE ONLY): 30 min Charges: SLP  Evaluations $ SLP Speech Visit: 1 Visit SLP Evaluations $BSS Swallow: 1 Procedure $MBS Swallow: 1 Procedure $Swallowing Treatment: 1 Procedure SLP visit diagnosis: SLP Visit Diagnosis: Dysphagia, oropharyngeal phase (R13.12) Past Medical History: Past Medical History: Diagnosis Date  Angioedema   possibly from voltaren  Bronchopneumonia 12/11/2016  Degenerative disc disease   Diabetes mellitus   type II  Hyperlipidemia   Hypertension   LVH (left ventricular hypertrophy)   and atrial enlargement by echo in past with nl EF  Nasal pruritis   Osteoarthritis   Osteopenia   Renal insufficiency   Sleep apnea   Stroke ([place]) 05/2010  Small vessel sobcortical (in [place] trial) with Dr Leonie Man, residual L hemiparesis  Vitamin B 12 deficiency 04/08 Past Surgical History: Past Surgical History: Procedure Laterality Date  ABDOMINAL HYSTERECTOMY    BSO-fibroids  APPENDECTOMY    BACK SURGERY    COLON SURGERY    due to punctured intestines  EYE SURGERY    cataract extraction  IR THORACENTESIS ASP PLEURAL SPACE W/IMG GUIDE  06/07/2020  KNEE SURGERY    arthroscope  PARS PLANA VITRECTOMY  07/31/2011  Procedure: PARS PLANA VITRECTOMY WITH 25 GAUGE;  Surgeon: [name], MD;  Location: [place];  Service: Ophthalmology;  Laterality: Right;  REMOVAL OF SILICONE OIL AND LASER RIGHT EYE  RETINAL DETACHMENT SURGERY  02/18/11  times 2  SPINE SURGERY  08/09  spinal decompression surgery Sonia Baller, MA, CCC-SLP Speech Therapy   EEG adult  Result Date: 10/01/2022 Lora Havens, MD     10/02/2022  8:47 AM Patient Name: SECRET CRADLE MRN: [id number] Epilepsy Attending: Lora Havens Referring Physician/Provider: Vernelle Emerald, MD Date: 10/01/2022 Duration: 21.20 mins Patient history: 89yo F with ams. EEG to evaluate for seizure Level of alertness: Awake AEDs during EEG study: None Technical aspects: This EEG study was done with scalp electrodes positioned according to the 10-20 International system of electrode placement.  Electrical activity was reviewed with band pass filter of 1-70Hz$ , sensitivity of 7 uV/mm, display speed of 28m/sec with a 60Hz$  notched filter applied as appropriate. EEG data were recorded continuously and digitally stored.  Video monitoring was available and reviewed as appropriate. Description: The posterior dominant rhythm consists of 8 Hz activity of moderate voltage (25-35 uV) seen predominantly in posterior head regions, symmetric and reactive to eye opening and eye closing. Hyperventilation and photic stimulation were not performed.   IMPRESSION: This study is within normal limits. No seizures or epileptiform discharges were seen throughout the recording. Priyanka [name]       Scheduled Meds:  amLODipine  10 mg Oral Daily   aspirin  325 mg Oral Daily   carvedilol  3.125 mg Oral BID WC   Chlorhexidine Gluconate Cloth  6 each Topical Daily   cholecalciferol  1,000 Units Oral Daily   cyanocobalamin  1,000 mcg Oral Daily   docusate  sodium  200 mg Oral QHS   enoxaparin (LOVENOX) injection  30 mg Subcutaneous Q24H   famotidine  20 mg Oral BID   fluticasone  2 spray Each Nare QHS   lisinopril  20 mg Oral Daily   melatonin  10 mg Oral QHS   mirtazapine  15 mg Oral QHS   multivitamin with minerals  1 tablet Oral Daily   pantoprazole  40 mg Oral Daily   sertraline  100 mg Oral Daily   Continuous Infusions:  ampicillin-sulbactam (UNASYN) IV Stopped (10/03/22 0624)   dextrose 10 % and 0.45 % NaCl 50 mL/hr at 10/03/22 0600     LOS: 7 days   Time spent= 35 mins    Rodricus Candelaria Arsenio Loader, MD [facility]  If 7PM-7AM, please contact night-coverage  10/03/2022, 7:30 AM

## 2022-10-03 NOTE — Progress Notes (Signed)
Occupational Therapy Treatment Patient Details Name: Renee [name] MRN: [id number] DOB: Mar 28, 1929 Today's Date: 10/03/2022   History of present illness Renee [name] is a 89 y.o. female admitted with acute metabolic encephalopathy. CT head with no acute intracranial abnormality. Noted significant blood sugar fluctuation tPMH: diabetes, CKD, CHF, previous stroke with chronic left-sided weakness, HTN, hyperlipidemia, and currently on antibiotics for urinary tract infection   OT comments  Patient able to brush her teeth with setup at edge of bed and stand twice to take steps to the head of the bed. She continues to be restless but able to follow commands and answer questions. Continue to recommend short term rehab.    Recommendations for follow up therapy are one component of a multi-disciplinary discharge planning process, led by the attending physician.  Recommendations may be updated based on patient status, additional functional criteria and insurance authorization.    Follow Up Recommendations  Skilled nursing-short term rehab (<3 hours/day)     Assistance Recommended at Discharge Frequent or constant Supervision/Assistance  Patient can return home with the following  Two people to help with walking and/or transfers;A lot of help with bathing/dressing/bathroom;Assistance with cooking/housework;Assist for transportation;Help with stairs or ramp for entrance   Equipment Recommendations  None recommended by OT    Recommendations for Other Services      Precautions / Restrictions Precautions Precautions: Fall Precaution Comments: LUE weak 2* prior CVA Restrictions Weight Bearing Restrictions: No       Mobility Bed Mobility                    Transfers                         Balance Overall balance assessment: Needs assistance Sitting-balance support: No upper extremity supported, Feet supported Sitting balance-Leahy Scale: Fair     Standing balance  support: During functional activity, Reliant on assistive device for balance Standing balance-Leahy Scale: Poor                             ADL either performed or assessed with clinical judgement   ADL Overall ADL's : Needs assistance/impaired     Grooming: Set up;Oral care;Sitting Grooming Details (indicate cue type and reason): brushed her teeth with setup at edge of bed. Min guard at edge of bed                             Functional mobility during ADLs: Moderate assistance;+2 for physical assistance General ADL Comments: Mod x 2 to stand and take steps toward head of bed x 2 trials.    Extremity/Trunk Assessment              Vision   Vision Assessment?: No apparent visual deficits   Perception     Praxis      Cognition Arousal/Alertness: Awake/alert Behavior During Therapy: Flat affect Overall Cognitive Status: Within Functional Limits for tasks assessed                                 General Comments: Alert to self, place, month and year        Exercises      Shoulder Instructions       General Comments      Pertinent Vitals/ Pain  Pain Assessment Pain Assessment: No/denies pain  Home Living                                          Prior Functioning/Environment              Frequency  Min 2X/week        Progress Toward Goals  OT Goals(current goals can now be found in the care plan section)  Progress towards OT goals: Progressing toward goals  Acute Rehab OT Goals Patient Stated Goal: none stated OT Goal Formulation: With family Time For Goal Achievement: 10/12/22 Potential to Achieve Goals: [place] Discharge plan remains appropriate    Co-evaluation                 AM-PAC OT "6 Clicks" Daily Activity     Outcome Measure   Help from another person eating meals?: A Little Help from another person taking care of personal grooming?: A Little Help from  another person toileting, which includes using toliet, bedpan, or urinal?: Total Help from another person bathing (including washing, rinsing, drying)?: A Lot Help from another person to put on and taking off regular upper body clothing?: A Lot Help from another person to put on and taking off regular lower body clothing?: Total 6 Click Score: 12    End of Session Equipment Utilized During Treatment: Rolling walker (2 wheels)  OT Visit Diagnosis: Muscle weakness (generalized) (M62.81);Other symptoms and signs involving cognitive function   Activity Tolerance Patient tolerated treatment well   Patient Left in bed;with call bell/phone within reach;with bed alarm set;with family/visitor present   Nurse Communication Mobility status        Time: [id number] OT Time Calculation (min): 27 min  Charges: OT General Charges $OT Visit: 1 Visit OT Treatments $Self Care/Home Management : 8-22 mins $Therapeutic Activity: 8-22 mins  Gustavo Lah, OTR/L [employer]  Office [phone]   Lenward Chancellor 10/03/2022, 4:24 PM

## 2022-10-03 NOTE — Progress Notes (Signed)
PHARMACY NOTE -  Unasyn  Pharmacy has been assisting with dosing of Unasyn for aspiration PNA. Borderline CrCl; dosage remains stable at 3g IV q12 hr and further renal adjustments per institutional Pharmacy antibiotic protocol  Pharmacy will sign off, following peripherally for culture results, dose adjustments, and length of therapy. Please reconsult if a change in clinical status warrants re-evaluation of dosage.  Reuel Boom, PharmD, BCPS [phone] 10/03/2022, 12:55 PM

## 2022-10-03 NOTE — TOC Progression Note (Addendum)
Transition of Care [facility]) - Progression Note    Patient Details  Name: Renee [name] MRN: [id number] Date of Birth: 02-17-1929  Transition of Care [facility]) CM/SW [place], LCSW Phone Number: 10/03/2022, 9:50 AM  Clinical Narrative:     CSW attempted to contact pt's APS worker Nira Conn [phone]) no answer left HIPAA complaint VM. TOC to follow.   ADDEN 10:20am CSW spoke with pt's APS worker, and she requested CSW email for pt's DNR form to be signed. She reported the MD must state on the document that the pt is requesting to be DNR. Form placed in pt's chart. TOC to follow.  2:37pm  Pt's DNR was emailed  to pt's APS worker [name].pt's APS worker is reported she is on protection time , and unable to contact her. CSW will send email inquiring about SNF placement. TOC to follow.  Barriers to Discharge: Continued Medical Work up  Expected Discharge Plan and Services   Discharge Planning Services: CM Consult Post Acute Care Choice: Durable Medical Equipment, Resumption of Svcs/PTA Provider ([place] w/ [facility]; Home oxygen w/ [employer]; walker, BSC, shower chair) Living arrangements for the past 2 months: Single Family Home                   DME Agency: [employer] Date DME Agency Contacted: 09/27/22 Time DME Agency Contacted: [id number] Representative spoke with at DME Agency: Brenton Grills HH Arranged: PT [place]: [place] Date [place]: 09/27/22 Time [place]: [phone] Representative spoke with at [place]: [place] Determinants of Health ([place]) Interventions [place]: No Food Insecurity (09/27/2022)  Housing: Low Risk  (09/27/2022)  Transportation Needs: No Transportation Needs (09/27/2022)  Utilities: Not At Risk (09/27/2022)  Alcohol Screen: Low Risk  (05/30/2022)  Depression (PHQ2-9): Low Risk  (05/30/2022)  Financial Resource Strain: Low Risk  (05/30/2022)  Physical Activity:  Inactive (05/30/2022)  Social Connections: Unknown (05/30/2022)  Stress: No Stress Concern Present (05/30/2022)  Tobacco Use: Low Risk  (10/01/2022)    Readmission Risk Interventions    09/27/2022    2:24 PM 06/10/2020    4:24 PM  Readmission Risk Prevention Plan  Transportation Screening Complete   PCP or Specialist Appt within 3-5 Days Complete   HRI or [place] Complete   Social Work Consult for [place] Planning/Counseling [place]  Not Applicable   Medication Review Press photographer) Complete   PCP or Specialist appointment within 3-5 days of discharge  Complete

## 2022-10-04 ENCOUNTER — Other Ambulatory Visit: Payer: Self-pay | Admitting: Family Medicine

## 2022-10-04 DIAGNOSIS — G9341 Metabolic encephalopathy: Secondary | ICD-10-CM | POA: Diagnosis not present

## 2022-10-04 DIAGNOSIS — F32A Depression, unspecified: Secondary | ICD-10-CM

## 2022-10-04 LAB — BASIC METABOLIC PANEL
Anion gap: 8 (ref 5–15)
BUN: 15 mg/dL (ref 8–23)
CO2: 29 mmol/L (ref 22–32)
Calcium: 8 mg/dL — ABNORMAL LOW (ref 8.9–10.3)
Chloride: 100 mmol/L (ref 98–111)
Creatinine, Ser: 1.05 mg/dL — ABNORMAL HIGH (ref 0.44–1.00)
GFR, Estimated: 50 mL/min — ABNORMAL LOW (ref >60)
Glucose, Bld: 232 mg/dL — ABNORMAL HIGH (ref 70–99)
Potassium: 3.6 mmol/L (ref 3.5–5.1)
Sodium: 137 mmol/L (ref 135–145)

## 2022-10-04 LAB — GLUCOSE, CAPILLARY
Glucose-Capillary: 227 mg/dL — ABNORMAL HIGH (ref 70–99)
Glucose-Capillary: 248 mg/dL — ABNORMAL HIGH (ref 70–99)
Glucose-Capillary: 250 mg/dL — ABNORMAL HIGH (ref 70–99)
Glucose-Capillary: 260 mg/dL — ABNORMAL HIGH (ref 70–99)
Glucose-Capillary: 29 mg/dL — CL (ref 70–99)
Glucose-Capillary: 303 mg/dL — ABNORMAL HIGH (ref 70–99)

## 2022-10-04 LAB — MAGNESIUM: Magnesium: 1.7 mg/dL (ref 1.7–2.4)

## 2022-10-04 LAB — CBC
HCT: 28.1 % — ABNORMAL LOW (ref 36.0–46.0)
Hemoglobin: 8.3 g/dL — ABNORMAL LOW (ref 12.0–15.0)
MCH: 27.2 pg (ref 26.0–34.0)
MCHC: 29.5 g/dL — ABNORMAL LOW (ref 30.0–36.0)
MCV: 92.1 fL (ref 80.0–100.0)
Platelets: 231 10*3/uL (ref 150–400)
RBC: 3.05 MIL/uL — ABNORMAL LOW (ref 3.87–5.11)
RDW: 13.9 % (ref 11.5–15.5)
WBC: 5.1 10*3/uL (ref 4.0–10.5)
nRBC: 0 % (ref 0.0–0.2)

## 2022-10-04 MED ORDER — ENOXAPARIN SODIUM 40 MG/0.4ML IJ SOSY
40.0000 mg | PREFILLED_SYRINGE | INTRAMUSCULAR | Status: DC
  Administered 2022-10-04 – 2022-10-07 (×4): 40 mg via SUBCUTANEOUS
  Filled 2022-10-04 (×4): qty 0.4

## 2022-10-04 MED ORDER — DEXTROSE-NACL 5-0.45 % IV SOLN: INTRAVENOUS | Status: AC

## 2022-10-04 MED ORDER — HYDRALAZINE HCL 25 MG PO TABS
25.0000 mg | ORAL_TABLET | Freq: Three times a day (TID) | ORAL | Status: DC
  Administered 2022-10-04 – 2022-10-07 (×10): 25 mg via ORAL
  Filled 2022-10-04 (×10): qty 1

## 2022-10-04 MED ORDER — SODIUM CHLORIDE 0.9 % IV SOLN
3.0000 g | Freq: Three times a day (TID) | INTRAVENOUS | Status: AC
  Administered 2022-10-04 – 2022-10-06 (×9): 3 g via INTRAVENOUS
  Filled 2022-10-04 (×9): qty 8

## 2022-10-04 NOTE — Inpatient Diabetes Management (Signed)
Inpatient Diabetes Program Recommendations  AACE/ADA: New Consensus Statement on Inpatient Glycemic Control (2015)  Target Ranges:  Prepandial:   less than 140 mg/dL      Peak postprandial:   less than 180 mg/dL (1-2 hours)      Critically ill patients:  140 - 180 mg/dL   Lab Results  Component Value Date   GLUCAP 227 (H) 10/04/2022   HGBA1C 9.7 (H) 09/27/2022    Review of Glycemic Control  Latest Reference Range & Units 10/03/22 13:40 10/03/22 15:48 10/03/22 21:43 10/03/22 23:45 10/04/22 08:07  Glucose-Capillary 70 - 99 mg/dL 222 (H) 213 (H) 237 (H) 239 (H) 227 (H)  (H): Data is abnormally high  Diabetes history: DM2 Outpatient Diabetes medications: Glipizide 2.5 mg QD, Actos 30 QD, Januvia 100 mg QD Current orders for Inpatient glycemic control: None  Inpatient Diabetes Program Recommendations:    Insulins DC'd yesterday due to hypoglycemia.  Glucose is now elevated.  Might consider:  Novolog 0-9 units TID and 0-5 units QHS  Will continue to follow while inpatient.  Thank you, Reche Dixon, MSN, [place] Diabetes Coordinator Inpatient Diabetes Program [phone] (team pager from 8a-5p)

## 2022-10-04 NOTE — TOC Progression Note (Addendum)
Transition of Care [facility]) - Progression Note    Patient Details  Name: Renee [name] MRN: [id number] Date of Birth: 28-Aug-1928  Transition [facility] Care [facility]) CM/SW [place], LCSW Phone Number: 10/04/2022, 9:33 AM  Clinical Narrative:    CSW received an email from [employer] pt's LG, stating she agrees with the recommendation for SNF placement. CSW will work pt up for SNF placement.   ADDEN 10:08am CSW spoke with [name] regarding pt's confidentiality. She reported that the patient's family is allowed to receive updates on the patient's disposition however they [place] on pt's behalf.TOC to follow.   Expected Discharge Plan: Skilled Nursing Facility Barriers to Discharge: Continued Medical Work up  Expected Discharge Plan and Services   Discharge Planning Services: CM Consult Post Acute Care Choice: Durable Medical Equipment, Resumption of Svcs/PTA Provider ([place] w/ [facility]; Home oxygen w/ [employer]; walker, BSC, shower chair) Living arrangements for the past 2 months: Single Family Home                   DME Agency: [employer] Date DME Agency Contacted: 09/27/22 Time DME Agency Contacted: [id number] Representative spoke with at DME Agency: Brenton Grills HH Arranged: PT [place]: [place] Date [place]: 09/27/22 Time [place]: 1433 Representative spoke with at [place]: [place] Determinants of Health ([place]) Interventions [place]: No Food Insecurity (09/27/2022)  Housing: [place]  (09/27/2022)  Transportation Needs: No Transportation Needs (09/27/2022)  Utilities: Not At Risk (09/27/2022)  Alcohol Screen: Low Risk  (05/30/2022)  Depression (PHQ2-9): Low Risk  (05/30/2022)  Financial Resource Strain: Low Risk  (05/30/2022)  Physical Activity: Inactive (05/30/2022)  Social Connections: Unknown (05/30/2022)  Stress: No Stress Concern Present (05/30/2022)  Tobacco Use:  Low Risk  (10/01/2022)    Readmission Risk Interventions    09/27/2022    2:24 PM 06/10/2020    4:24 PM  Readmission Risk Prevention Plan  Transportation Screening Complete   PCP or Specialist Appt within 3-5 Days Complete   HRI or [place] Complete   Social Work Consult for [place] Planning/Counseling [place] Not Applicable   Medication Review Press photographer) Complete   PCP or Specialist appointment within 3-5 days of discharge  Complete

## 2022-10-04 NOTE — Progress Notes (Signed)
Speech Language Pathology Treatment: Dysphagia  Patient Details Name: Renee [name] MRN: [id number] DOB: 09/21/1928 Today's Date: 10/04/2022 Time: 1850-1905 SLP Time Calculation (min) (ACUTE ONLY): 15 min  Assessment / Plan / Recommendation Clinical Impression  Follow up to assess po tolerance and indication for dietary advancement. Pt greeted with sister in law in room and pt slid down in bed. She had already eaten dinner and NT reports she did well without any coughing. SLP slid pt up in bed with RN assist but she immediately slid down due to chronic choreic movement of her right arm and leg. Her neck is extended and she needs total cues to keep in neutral position. Pt willing to consume water, nectar thick juice, icecream and soda. Due to suboptimal positioning, pt's difficulty with focusing - closing eyes - needing moderate cues to open mouth to accept po, significant delay with swallow initiation, and RR increasing to 22 -did not provide solids requiring mastication. No overt indication of aspiration - even with water via straw *silent asp on MBS with thin. No oral retention noted but pt consumed essentially only liquids.   Pt does wince and reports "burning" when swallowing applejuice and soda - therefore recommend avoiding acidic, carbonated or really hot foods. Frequent eructation noted during intake but pt denies issues with refluxing - known h/o esophageal deficits- which are likely her largest aspiration risk. If pt's mentation and respiratory status improve, advancing to dys3 advised - especially if po intake is poor and she is tolerating advancement.   Will follow up next week to determine if SLP dysphagia  treatment (swallow exercises, etc) is indicated vs comfort care route. Pt and sister in law agreement and pt states she is willing to go to Rehab if needed.   Educated pt's sister in law to recommendations using demonstration of importance to assure pt swallows *observing larynx lift*  before providing more po and posted new sign with dietary advancement information.    HPI HPI: Renee [name] is a 89 y.o. female with medical history significant for chronic diastolic CHF, nocturnal hypoxemia, recent diagnosis of UTI and on antibiotics, history of CVA with left-sided weakness, laryngopharyngeal reflux disease, pharyngoesophageal dysphagia, bilateral chronic otitis media with effusion status post prior ear tube placement, physical deconditioning, chronic anxiety/depression, mobility impairment, chronic respiratory failure with hypoxia, who presented to [facility] ED from her PCP due to lethargy and hypersomnolence. 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 Pna vs atx, ct brain negative for acute, Stable encephalomalacia within the right basal ganglia consistent with prior infarct. pt coughing with po      SLP Plan  Continue with current plan of care      Recommendations for follow up therapy are one component of a multi-disciplinary discharge planning process, led by the attending physician.  Recommendations may be updated based on patient status, additional functional criteria and insurance authorization.    Recommendations  Diet recommendations: Nectar-thick liquid (thin water between meals) Liquids provided via: Cup;Straw Medication Administration: Other (Comment) (whole if small, crushed if large and not contraindicated) Supervision: Staff to assist with self feeding Compensations: Slow rate;Small sips/bites;Other (Comment);Minimize environmental distractions (try small frequent meals) Postural Changes and/or Swallow Maneuvers: Upright 30-60 min after meal;Seated upright 90 degrees                Oral Care Recommendations: Oral care BID Follow Up Recommendations: Skilled nursing-short term rehab (<3 hours/day) (dependent on  Learned) SLP Visit Diagnosis: Dysphagia, oropharyngeal phase (R13.12) Plan:  Continue with current plan of care        Kathleen Lime, MS [place] Office [phone]    Macario Golds  10/04/2022, 7:35 PM

## 2022-10-04 NOTE — Progress Notes (Signed)
PHARMACY NOTE:  ANTIMICROBIAL RENAL DOSAGE ADJUSTMENT  Current antimicrobial regimen includes a mismatch between antimicrobial dosage and estimated renal function.  As per policy approved by the Pharmacy & Therapeutics and Medical Executive Committees, the antimicrobial dosage will be adjusted accordingly.  Current antimicrobial dosage:  Unasyn 3gm IV q12h  Indication: aspiration PNA  Renal Function:  Estimated Creatinine Clearance: 33.6 mL/min (A) (by C-G formula based on SCr of 1.05 mg/dL (H)). []$      On intermittent HD, scheduled: []$      On CRRT    Antimicrobial dosage has been changed to:  Unasyn 3gm IV q8h  Additional comments:   Thank you for allowing pharmacy to be a part of this patient's care.  Netta C[place], [facility] 10/04/2022 5:10 AM

## 2022-10-04 NOTE — Progress Notes (Signed)
PROGRESS NOTE    Renee [name]  [id number] DOB: 27-Jun-1929 DOA: 09/26/2022 PCP: Girtha Rm, NP-C   Brief Narrative:  89 year old with history of previous CVA with left-sided hemiparesis and dysphagia, chronic diastolic CHF EF [id number] with grade 2 DD, chronic nocturnal hypoxia, recent UTI on antibiotics, laryngeal reflux disease, chronic otitis media with effusion status post ear tube placement, physical deconditioning, depression/anxiety, mobility impairment, sent from PCP office for lethargy.  Prior to admission went to urgent care on 2/3 and found to have Proteus UTI treated with Keflex and Levaquin.  In the ED was noted to have acute CHF requiring IV Lasix and patient was admitted.  During hospitalization patient was noted to have recurrent aspiration requiring antibiotic treatment.  Also was diagnosed with Enterococcus urinary tract infection initially required linezolid thereafter 1 dose of fosfomycin.  During this time patient has been noted to be incompetent therefore court has assigned a guardianship.  Case has been filed against previous caretaker for APS. DDS DNR form was signed   Assessment & Plan:  Principal Problem:   Acute metabolic encephalopathy Active Problems:   Acute respiratory failure with hypoxia (HCC)   Complicated UTI (urinary tract infection)   Aspiration pneumonia of both lower lobes due to gastric secretions (HCC)   Dysphagia   Acute on chronic diastolic CHF (congestive heart failure) (HCC)   Type 2 diabetes mellitus with stage 3b chronic kidney disease, without long-term current use of insulin (HCC)   Closed fracture of two ribs, right, initial encounter   Chronic kidney disease, stage 3b ([place])   Essential hypertension   GERD without esophagitis   Hyperkalemia   Hemiplegia, late effect of cerebrovascular disease (HCC)   History of CVA (cerebrovascular accident)   CKD (chronic kidney disease) stage 4, GFR 15-29 ml/min (HCC)   Asthma, chronic, unspecified  asthma severity, with acute exacerbation   Obesity (BMI 30-39.9)   Chronic respiratory failure with hypoxia (HCC)     Assessment and Plan: * Acute metabolic encephalopathy - Fluctuating mentation this morning she is drowsy but easily arousable.  Combination of aspiration pneumonia, UTI and hypoglycemia. - Continue Unasyn - MRI brain 2/11-negative.  TSH, VBG, ammonia, B12 and UDS negative.  EEG unremarkable - At baseline apparently patient is awake alert oriented and follows basic commands.  Complicated UTI (urinary tract infection) -Urine culture 2/3-Proteus, 2/7-VRE Enterococcus faecium. - On Unasyn, received cefepime/Flagyl then transition to linezolid hide thereafter 1 dose of fosfomycin 2/13  Acute respiratory failure with hypoxia ([place]), from aspiration pneumonia -As needed bronchodilators.  Also receiving intermittent IV Lasix.  Aspiration pneumonia of both lower lobes due to gastric secretions ([place]) Dysphagia -Unfortunately patient has had multiple witnessed episodes of aspiration.  Confirmed by speech and swallow.  MBS performed.  Dysphagia 1 diet.  Cultures remain negative.  Acute on chronic diastolic CHF (congestive heart failure) (HCC) -Echocardiogram 2023 showed EF of 65%, grade 2 DD.  Intermittently receiving IV Lasix.  Type 2 diabetes mellitus with stage 3b chronic kidney disease, without long-term current use of insulin (HCC) Recurrent hypoglycemic episodes -During the hospitalization patient having recurrent hypoglycemic episodes.  Hemoglobin A1c 9.7.  Hold off on any insulin at this time. Change fluids to D5 half-normal saline  Closed fracture of two ribs, right, initial encounter, right 10th and 11th rib -No other obvious physical trauma noted.  Pain control.  Vitamin D normal 1 month ago  Chronic kidney disease, stage 3b (HCC) -Creatinine remains around baseline of 1.2.   Essential hypertension, Uncontrolled -  Currently on Norvasc, Coreg, lisinopril. Add  hydralazine 25 mg 3 times daily. Getting IV as needed  GERD without esophagitis -PPI  As documented by previous provider, there has been issues with previous guardianship and possible APS involvement.  Currently only point of contact is [employer] [phone]. [place].  Ms Granville Lewis is not allowed to visit the patient.   PT/OT-SNF  DVT prophylaxis: Lovenox Code Status: Full code but patient wishes to be DNR, DDS DNR paperwork signed.  Officially awaiting to be notarized so it can be changed Family Communication: Great-nephew and his wife-Shawn and Chelsea Huffiness are at bedside  Status is: Inpatient Continue hospital stay    Subjective: Seen and examined at bedside, no complaints.  Patient is drowsy. I had extensive discussion with the patient's family members and I explained them that patient is at risk of recurrent aspiration and overall poor quality of life.  She would benefit from comfort feeding and hospice given her chronic comorbidities.  They are in agreement and will discuss also with rest of the family members and [name]  Patient had very poor oral intake yesterday  Examination: Constitutional: Not in acute distress, drowsy but easily arousable.  Chronically ill-appearing. Respiratory: Clear to auscultation bilaterally Cardiovascular: Normal sinus rhythm, no rubs Abdomen: Nontender nondistended good bowel sounds Musculoskeletal: No edema noted Skin: No rashes seen Neurologic: CN 2-12 grossly intact.  And nonfocal Psychiatric: Normal judgment and insight. Alert and oriented x 3. Normal mood.  Objective: Vitals:   10/04/22 0500 10/04/22 0539 10/04/22 0600 10/04/22 0608  BP:  (!) 204/92  (!) 177/61  Pulse:  84 63 68  Resp:  20 19 18  $ Temp:      TempSrc:      SpO2:  98% 100% 100%  Weight: 73.5 kg     Height:        Intake/Output Summary (Last 24 hours) at 10/04/2022 0719 Last data filed at 10/04/2022 [id number] Gross per 24 hour  Intake 1425.56  ml  Output 150 ml  Net 1275.56 ml   Filed Weights   10/02/22 0550 10/02/22 1803 10/04/22 0500  Weight: 74.7 kg 73.7 kg 73.5 kg     Data Reviewed:   CBC: Recent Labs  Lab 09/29/22 0540 09/30/22 0500 10/01/22 0424 10/02/22 0503 10/03/22 0252 10/04/22 0256  WBC 13.9* 14.6* 8.7 5.9 6.4 5.1  NEUTROABS 12.0* 12.7* 6.6 3.8 4.8  --   HGB 8.8* 9.1* 8.7* 8.5* 9.5* 8.3*  HCT 29.6* 31.4* 28.9* 29.0* 32.4* 28.1*  MCV 92.5 95.4 92.6 92.4 92.8 92.1  PLT 208 212 197 208 275 [id number]   Basic Metabolic Panel: Recent Labs  Lab 09/27/22 1032 09/28/22 0357 09/28/22 1224 09/29/22 0540 09/30/22 0500 10/01/22 0424 10/02/22 0503 10/02/22 1859 10/03/22 0252 10/04/22 0256  NA 135 131* 132*   < > 130* 133* 135 136 136 137  K 4.6 5.4* 4.6   < > 4.9 4.5 3.8 3.9 3.6 3.6  CL 95* 97* 101   < > 98 100 98 100 99 100  CO2 27 23 22   $ < > 22 25 28 26 27 29  $ GLUCOSE 223* 360* 217*   < > 290* 149* 120* 235* 110* 232*  BUN 22 24* 22   < > 22 17 14 17 21 15  $ CREATININE 1.27* 1.50* 1.42*   < > 1.32* 1.21* 1.10* 1.29* 1.25* 1.05*  CALCIUM 8.5* 7.7* 7.9*   < > 7.9* 8.1* 7.9* 8.1* 8.1* 8.0*  MG  --   --   --    < >  1.8 1.7 1.7  --  1.8 1.7  PHOS 4.5 4.0 2.9  --   --   --   --   --   --   --    < > = values in this interval not displayed.   GFR: Estimated Creatinine Clearance: 33.5 mL/min (A) (by C-G formula based on SCr of 1.05 mg/dL (H)). Liver Function Tests: Recent Labs  Lab 09/29/22 0540 09/30/22 0500 10/01/22 0424 10/02/22 0503 10/03/22 0252  AST 22 24 33 31 38  ALT 20 20 19 19 25  $ ALKPHOS 211* 221* 170* 160* 186*  BILITOT 0.3 0.7 0.6 0.4 0.5  PROT 5.2* 5.4* 5.1* 4.8* 5.8*  ALBUMIN 2.6* 2.7* 2.7* 2.4* 3.0*   No results for input(s): "LIPASE", "AMYLASE" in the last 168 hours.  No results for input(s): "AMMONIA" in the last 168 hours.  Coagulation Profile: No results for input(s): "INR", "PROTIME" in the last 168 hours. Cardiac Enzymes: No results for input(s): "CKTOTAL", "CKMB",  "CKMBINDEX", "TROPONINI" in the last 168 hours. BNP (last 3 results) Recent Labs    07/04/22 1140 08/24/22 1426  PROBNP 161.0* 298.0*   HbA1C: No results for input(s): "HGBA1C" in the last 72 hours. CBG: Recent Labs  Lab 10/03/22 0948 10/03/22 1340 10/03/22 1548 10/03/22 2143 10/03/22 2345  GLUCAP 134* 222* 213* 237* 239*   Lipid Profile: No results for input(s): "CHOL", "HDL", "LDLCALC", "TRIG", "CHOLHDL", "LDLDIRECT" in the last 72 hours. Thyroid Function Tests: No results for input(s): "TSH", "T4TOTAL", "FREET4", "T3FREE", "THYROIDAB" in the last 72 hours. Anemia Panel: No results for input(s): "VITAMINB12", "FOLATE", "FERRITIN", "TIBC", "IRON", "RETICCTPCT" in the last 72 hours. Sepsis Labs: Recent Labs  Lab 09/29/22 0540 10/02/22 2047 10/02/22 2200  PROCALCITON 0.28  --   --   LATICACIDVEN  --  0.7 0.9    Recent Results (from the past 240 hour(s))  Blood culture (routine x 2)     Status: None   Collection Time: 09/26/22  3:13 PM   Specimen: BLOOD LEFT HAND  Result Value Ref Range Status   Specimen Description   Final    BLOOD LEFT HAND Performed at [facility], [place] [address] [place], [place] [address]    Special Requests   Final    BOTTLES DRAWN AEROBIC AND ANAEROBIC Blood Culture results may not be optimal due to an inadequate volume of blood received in culture bottles Performed at [facility] [address], [place], [place] [address]    Culture   Final    NO GROWTH 5 DAYS Performed at [facility], [place] [address], [place], [place] [address]    Report Status 10/01/2022 FINAL  Final  Blood culture (routine x 2)     Status: None   Collection Time: 09/26/22  3:33 PM   Specimen: BLOOD  Result Value Ref Range Status   Specimen Description   Final    BLOOD RIGHT ANTECUBITAL Performed at [facility] [address] [place], [place] [address]    Special Requests   Final    BOTTLES DRAWN AEROBIC AND  ANAEROBIC Blood Culture adequate volume Performed at [facility] [address], [place], [place] [address]    Culture   Final    NO GROWTH 5 DAYS Performed at [facility], [place] [address] [place], [place] [address]    Report Status 10/01/2022 FINAL  Final  Urine Culture (for pregnant, neutropenic or urologic patients or patients with an  indwelling urinary catheter)     Status: Abnormal   Collection Time: 09/26/22  4:00 PM   Specimen: Urine, Clean Catch  Result Value Ref Range Status   Specimen Description   Final    URINE, CLEAN CATCH Performed at [facility], [place] [address] [place], [place] [address]    Special Requests   Final    NONE Performed at [facility], [place] [address], [place], [place] [address]    Culture (A)  Final    20,000 COLONIES/mL ENTEROCOCCUS FAECIUM VANCOMYCIN RESISTANT ENTEROCOCCUS    Report Status 09/29/2022 FINAL  Final   Organism ID, Bacteria ENTEROCOCCUS FAECIUM (A)  Final      Susceptibility   Enterococcus faecium - MIC*    AMPICILLIN >=32 RESISTANT Resistant     NITROFURANTOIN 64 INTERMEDIATE Intermediate     VANCOMYCIN >=32 RESISTANT Resistant     LINEZOLID 2 SENSITIVE Sensitive     * 20,000 COLONIES/mL ENTEROCOCCUS FAECIUM  Resp panel by RT-PCR (RSV, Flu A&B, Covid) Anterior Nasal Swab     Status: None   Collection Time: 09/26/22  4:39 PM   Specimen: Anterior Nasal Swab  Result Value Ref Range Status   SARS Coronavirus 2 by RT PCR NEGATIVE NEGATIVE Final    Comment: (NOTE) SARS-CoV-2 target nucleic acids are NOT DETECTED.  The SARS-CoV-2 RNA is generally detectable in upper respiratory specimens during the acute phase of infection. The lowest concentration of SARS-CoV-2 viral copies this assay can detect is 138 copies/mL. A negative result does not preclude SARS-Cov-2 infection and should not be used as the sole basis for treatment or other patient management decisions. A  negative result may occur with  improper specimen collection/handling, submission of specimen other than nasopharyngeal swab, presence of viral mutation(s) within the areas targeted by this assay, and inadequate number of viral copies(<138 copies/mL). A negative result must be combined with clinical observations, patient history, and epidemiological information. The expected result is Negative.  Fact Sheet for Patients:  EntrepreneurPulse.com.au  Fact Sheet for Healthcare Providers:  IncredibleEmployment.be  This test is no t yet approved or cleared by the [place] FDA and  has been authorized for detection and/or diagnosis of SARS-CoV-2 by FDA under an Emergency Use Authorization (EUA). This EUA will remain  in effect (meaning this test can be used) for the duration of the COVID-19 declaration under Section 564(b)(1) of the Act, 21 U.S.C.section 360bbb-3(b)(1), unless the authorization is terminated  or revoked sooner.       Influenza A by PCR NEGATIVE NEGATIVE Final   Influenza B by PCR NEGATIVE NEGATIVE Final    Comment: (NOTE) The Xpert Xpress SARS-CoV-2/FLU/RSV plus assay is intended as an aid in the diagnosis of influenza from Nasopharyngeal swab specimens and should not be used as a sole basis for treatment. Nasal washings and aspirates are unacceptable for Xpert Xpress SARS-CoV-2/FLU/RSV testing.  Fact Sheet for Patients: EntrepreneurPulse.com.au  Fact Sheet for Healthcare Providers: IncredibleEmployment.be  This test is not yet approved or cleared by the [place] FDA and has been authorized for detection and/or diagnosis of SARS-CoV-2 by FDA under an Emergency Use Authorization (EUA). This EUA will remain in effect (meaning this test can be used) for the duration of the COVID-19 declaration under Section 564(b)(1) of the Act, 21 U.S.C. section 360bbb-3(b)(1), unless the authorization  is terminated or revoked.     Resp Syncytial Virus by PCR NEGATIVE NEGATIVE Final    Comment: (NOTE) Fact Sheet for Patients: EntrepreneurPulse.com.au  Fact Sheet for Healthcare Providers: IncredibleEmployment.be  This test is not yet approved or cleared by the [place] FDA and has been authorized for detection and/or diagnosis of SARS-CoV-2 by FDA under an Emergency Use Authorization (EUA). This EUA will remain in effect (meaning this test can be used) for the duration of the COVID-19 declaration under Section 564(b)(1) of the Act, 21 U.S.C. section 360bbb-3(b)(1), unless the authorization is terminated or revoked.  Performed at [facility], [place] [address], [place], [place] [address]   Respiratory (~20 pathogens) panel by PCR     Status: None   Collection Time: 09/26/22  5:18 PM   Specimen: Nasopharyngeal Swab; Respiratory  Result Value Ref Range Status   Adenovirus NOT DETECTED NOT DETECTED Final   Coronavirus 229E NOT DETECTED NOT DETECTED Final    Comment: (NOTE) The Coronavirus on the Respiratory Panel, DOES NOT test for the novel  Coronavirus (2019 nCoV)    Coronavirus HKU1 NOT DETECTED NOT DETECTED Final   Coronavirus NL63 NOT DETECTED NOT DETECTED Final   Coronavirus OC43 NOT DETECTED NOT DETECTED Final   Metapneumovirus NOT DETECTED NOT DETECTED Final   Rhinovirus / Enterovirus NOT DETECTED NOT DETECTED Final   Influenza A NOT DETECTED NOT DETECTED Final   Influenza B NOT DETECTED NOT DETECTED Final   Parainfluenza Virus 1 NOT DETECTED NOT DETECTED Final   Parainfluenza Virus 2 NOT DETECTED NOT DETECTED Final   Parainfluenza Virus 3 NOT DETECTED NOT DETECTED Final   Parainfluenza Virus 4 NOT DETECTED NOT DETECTED Final   Respiratory Syncytial Virus NOT DETECTED NOT DETECTED Final   Bordetella pertussis NOT DETECTED NOT DETECTED Final   Bordetella Parapertussis NOT DETECTED NOT DETECTED Final    Chlamydophila pneumoniae NOT DETECTED NOT DETECTED Final   Mycoplasma pneumoniae NOT DETECTED NOT DETECTED Final    Comment: Performed at [facility], [place]. [address] [place], [place] [address]  MRSA Next Gen by PCR, Nasal     Status: None   Collection Time: 10/02/22  5:24 PM   Specimen: Nasal Mucosa; Nasal Swab  Result Value Ref Range Status   MRSA by PCR Next Gen NOT DETECTED NOT DETECTED Final    Comment: (NOTE) The GeneXpert MRSA Assay (FDA approved for NASAL specimens only), is one component of a comprehensive MRSA colonization surveillance program. It is not intended to diagnose MRSA infection nor to guide or monitor treatment for MRSA infections. Test performance is not FDA approved in patients less than 29 years old. Performed at [facility], [place] [address] [place], [place] [address]   Culture, blood (Routine X 2) w Reflex to ID Panel     Status: None (Preliminary result)   Collection Time: 10/02/22  8:47 PM   Specimen: BLOOD  Result Value Ref Range Status   Specimen Description   Final    BLOOD RIGHT ANTECUBITAL Performed at [facility] [address] [place], [place] [address]    Special Requests   Final    BOTTLES DRAWN AEROBIC ONLY Blood Culture adequate volume Performed at [facility] [address] [place], [place] [address]    Culture   Final    NO GROWTH < 12 HOURS Performed at [facility] [address], [place], [place] [address]    Report Status PENDING  Incomplete  Culture, blood (Routine X 2) w Reflex to ID Panel     Status: None (Preliminary result)   Collection Time: 10/02/22  8:47  PM   Specimen: BLOOD  Result Value Ref Range Status   Specimen Description   Final    BLOOD BLOOD LEFT HAND Performed at [place] [address], [place], [place] [address]    Special Requests   Final    BOTTLES DRAWN AEROBIC ONLY Blood Culture results may not be optimal  due to an inadequate volume of blood received in culture bottles Performed at [place] [facility] [address] [place], [place] [address]    Culture   Final    NO GROWTH < 12 HOURS Performed at [facility] [address] [place], [place] [address]    Report Status PENDING  Incomplete         Radiology Studies: No results found.      Scheduled Meds:  amLODipine  10 mg Oral Daily   aspirin  325 mg Oral Daily   carvedilol  3.125 mg Oral BID WC   Chlorhexidine Gluconate Cloth  6 each Topical Daily   cholecalciferol  1,000 Units Oral Daily   cyanocobalamin  1,000 mcg Oral Daily   docusate sodium  200 mg Oral QHS   enoxaparin (LOVENOX) injection  40 mg Subcutaneous Q24H   famotidine  20 mg Oral BID   feeding supplement (GLUCERNA SHAKE)  237 mL Oral TID BM   fluticasone  2 spray Each Nare QHS   lisinopril  20 mg Oral Daily   melatonin  10 mg Oral QHS   mirtazapine  15 mg Oral QHS   multivitamin with minerals  1 tablet Oral Daily   mouth rinse  15 mL Mouth Rinse 4 times per day   pantoprazole  40 mg Oral Daily   sertraline  100 mg Oral Daily   Continuous Infusions:  ampicillin-sulbactam (UNASYN) IV Stopped (10/04/22 0558)   dextrose 10 % and 0.45 % NaCl 50 mL/hr at 10/04/22 0625     LOS: 8 days   Time spent= 35 mins    Tennile Styles Arsenio Loader, MD [facility]  If 7PM-7AM, please contact night-coverage  10/04/2022, 7:19 AM

## 2022-10-04 NOTE — [place] FL2 (Signed)
[place] MEDICAID FL2 LEVEL OF CARE FORM     IDENTIFICATION  Patient Name: Renee [name] Birthdate: 1928-12-22 Sex: female Admission Date (Current Location): 09/26/2022  [facility] and [place] Number:  Herbalist and Address:  [facility],  [place]      Provider Number: [id number]  Attending Physician Name and Address:  Damita Lack, MD  Relative Name and Phone Number:  [name] (Legal Guardian727 [phone] (Work Phone)    Current Level of Care: Hospital Recommended Level of Care: [place] Prior Approval Number:    Date Approved/Denied:   PASRR Number: [id number]  Discharge Plan: SNF    Current Diagnoses: Patient Active Problem List   Diagnosis Date Noted   Closed fracture of two ribs, right, initial encounter [id number]   Complicated UTI (urinary tract infection) 09/28/2022   Chronic kidney disease, stage 3b ([place]) 09/28/2022   GERD without esophagitis 09/28/2022   Hyperkalemia 09/28/2022   Aspiration pneumonia of both lower lobes due to gastric secretions ([place]) 09/28/2022   Dysphagia [id number]   Acute metabolic encephalopathy [id number]   Encounter for competency evaluation 09/14/2022   Fatigue 07/04/2022   History of UTI 02/28/2022   Physical deconditioning 01/19/2022   Chronic respiratory failure with hypoxia ([place]) 01/19/2022   Asthma, chronic, unspecified asthma severity, with acute exacerbation 01/06/2022   Allergic rhinitis 01/06/2022   Acute on chronic diastolic CHF (congestive heart failure) ([place]) 01/06/2022   Obesity (BMI 30-39.9) 01/06/2022   Acute respiratory failure with hypoxia ([place]) 01/05/2022   Bradycardia 09/01/2021   UTI (urinary tract infection) 08/29/2021   Wheezing on expiration 08/29/2021   Sepsis due to pneumonia ([place]) 06/07/2020   Pleural effusion 05/13/2020   Frequent UTI 05/13/2020   Dizziness 02/12/2020   Normocytic anemia 02/12/2020   Fall involving  sidewalk curb 11/29/2019   Contusion of back 11/27/2019   Aortic atherosclerosis ([place]) 07/13/2019   CKD (chronic kidney disease) stage 4, GFR 15-29 ml/min (HCC) 07/01/2019   External hemorrhoid 01/17/2018   History of colitis 01/17/2018   Generalized weakness 12/24/2016   Diabetic retinopathy ([place]) 12/11/2016   History of CVA (cerebrovascular accident) 12/11/2016   Weakness secondary to UTI 10/21/2015   Acute renal failure superimposed on stage 3b chronic kidney disease ([place]) 10/12/2015   CAP (community acquired pneumonia) 10/07/2015   Estrogen deficiency 08/30/2015   Mobility impaired 06/26/2011   History of retinal detachment 01/10/2011   Sleep apnea 11/28/2010   Anxiety and depression 08/25/2010   Hemiplegia, late effect of cerebrovascular disease ([place]) 07/05/2010   POSTHERPETIC NEURALGIA 11/09/2009   Renal insufficiency 06/29/2008   Chronic back pain 01/26/2008   EDEMA 01/26/2008   B12 deficiency 01/10/2007   Type 2 diabetes mellitus with stage 3b chronic kidney disease, without long-term current use of insulin ([place]) 11/27/2006   Essential hypertension 11/27/2006   FIBROCYSTIC BREAST DISEASE 11/27/2006   ROSACEA 11/27/2006   OSTEOARTHRITIS 11/27/2006   Urinary incontinence 11/27/2006    Orientation RESPIRATION BLADDER Height & Weight     Self, Time, Situation, Place  O2 (2L) Incontinent Weight: 162 lb 0.6 oz (73.5 kg) Height:  5' 3"$  (160 cm)  BEHAVIORAL SYMPTOMS/MOOD NEUROLOGICAL BOWEL NUTRITION STATUS      Incontinent    AMBULATORY STATUS COMMUNICATION OF NEEDS Skin   Extensive Assist Verbally Normal                       Personal Care Assistance Level of Assistance  Bathing, Feeding,  Dressing Bathing Assistance: Maximum assistance Feeding assistance: Independent Dressing Assistance: Maximum assistance     Functional Limitations Info  Sight, Hearing, Speech Sight Info: Adequate Hearing Info: Adequate Speech Info: Adequate    SPECIAL CARE FACTORS  FREQUENCY  PT (By licensed PT), OT (By licensed OT)     PT Frequency: 5 x a week OT Frequency: 5 x a week            Contractures Contractures Info: Not present    Additional Factors Info  Code Status, Allergies Code Status Info: full Allergies Info: Tape,Voltaren (diclofenac           Current Medications (10/04/2022):  This is the current hospital active medication list Current Facility-Administered Medications  Medication Dose Route Frequency Provider Last Rate Last Admin   acetaminophen (TYLENOL) tablet 650 mg  650 mg Oral Q6H PRN Kayleen Memos, DO   650 mg at 10/01/22 2206   amLODipine (NORVASC) tablet 10 mg  10 mg Oral Daily Irene Pap N, DO   10 mg at 10/04/22 0842   Ampicillin-Sulbactam (UNASYN) 3 g in sodium chloride 0.9 % 100 mL IVPB  3 g Intravenous Q8H Thomes Lolling, [facility]   Stopped at 10/04/22 [id number]   aspirin tablet 325 mg  325 mg Oral Daily Vernelle Emerald, MD   325 mg at 10/04/22 0843   carvedilol (COREG) tablet 3.125 mg  3.125 mg Oral BID WC Vernelle Emerald, MD   3.125 mg at 10/04/22 [id number]   Chlorhexidine Gluconate Cloth 2 % PADS 6 each  6 each Topical Daily Shalhoub, Sherryll Burger, MD   6 each at 10/04/22 0844   cholecalciferol (VITAMIN D3) 25 MCG (1000 UNIT) tablet 1,000 Units  1,000 Units Oral Daily Rai, Ripudeep K, MD   1,000 Units at 10/04/22 0843   cyanocobalamin (VITAMIN B12) tablet 1,000 mcg  1,000 mcg Oral Daily Irene Pap N, DO   1,000 mcg at 10/04/22 0842   dextrose 5 %-0.45 % sodium chloride infusion   Intravenous Continuous Damita Lack, MD 50 mL/hr at 10/04/22 0809 New Bag at 10/04/22 0809   docusate sodium (COLACE) capsule 200 mg  200 mg Oral QHS Rai, Ripudeep K, MD   200 mg at 10/03/22 2129   enoxaparin (LOVENOX) injection 40 mg  40 mg Subcutaneous Q24H Thomes Lolling, RPH       famotidine (PEPCID) tablet 20 mg  20 mg Oral BID Vernelle Emerald, MD   20 mg at 10/04/22 0843   feeding supplement (GLUCERNA SHAKE) (GLUCERNA SHAKE)  liquid 237 mL  237 mL Oral TID BM Amin, Ankit Chirag, MD   237 mL at 10/04/22 0844   fluticasone (FLONASE) 50 MCG/ACT nasal spray 2 spray  2 spray Each Nare QHS Vernelle Emerald, MD   2 spray at 10/03/22 2129   food thickener (SIMPLYTHICK (NECTAR/LEVEL 2/MILDLY THICK)) 1 packet  1 packet Oral PRN Shalhoub, Sherryll Burger, MD       guaiFENesin (ROBITUSSIN) 100 MG/5ML liquid 5 mL  5 mL Oral Q4H PRN Amin, Ankit Chirag, MD       hydrALAZINE (APRESOLINE) injection 10 mg  10 mg Intravenous Q4H PRN Amin, Ankit Chirag, MD   10 mg at 10/04/22 0549   hydrALAZINE (APRESOLINE) tablet 25 mg  25 mg Oral Q8H Amin, Ankit Chirag, MD   25 mg at 10/04/22 0843   iohexol (OMNIPAQUE) 300 MG/ML solution 100 mL  100 mL Intravenous Once PRN Shalhoub, Sherryll Burger, MD  ipratropium-albuterol (DUONEB) 0.5-2.5 (3) MG/3ML nebulizer solution 3 mL  3 mL Nebulization Q4H PRN Shalhoub, Sherryll Burger, MD       lip balm (CARMEX) ointment   Topical PRN Vernelle Emerald, MD   1 Application at [id number]   lisinopril (ZESTRIL) tablet 20 mg  20 mg Oral Daily Shalhoub, Sherryll Burger, MD   20 mg at 10/04/22 [id number]   melatonin tablet 10 mg  10 mg Oral QHS Vernelle Emerald, MD   10 mg at 10/03/22 2129   metoprolol tartrate (LOPRESSOR) injection 5 mg  5 mg Intravenous Q4H PRN Amin, Jeanella Flattery, MD       mirtazapine (REMERON) tablet 15 mg  15 mg Oral QHS Rai, Ripudeep K, MD   15 mg at 10/03/22 2130   multivitamin with minerals tablet 1 tablet  1 tablet Oral Daily Shalhoub, Sherryll Burger, MD   1 tablet at 10/04/22 0843   ondansetron (ZOFRAN) injection 4 mg  4 mg Intravenous Q6H PRN Amin, Jeanella Flattery, MD       Oral care mouth rinse  15 mL Mouth Rinse 4 times per day Damita Lack, MD   15 mL at 10/04/22 0844   Oral care mouth rinse  15 mL Mouth Rinse PRN Amin, Ankit Chirag, MD       pantoprazole (PROTONIX) EC tablet 40 mg  40 mg Oral Daily Irene Pap N, DO   40 mg at 10/04/22 0843   polyethylene glycol (MIRALAX / GLYCOLAX) packet 17 g  17 g Oral  Daily PRN Hall, Carole N, DO       senna-docusate (Senokot-S) tablet 1 tablet  1 tablet Oral QHS PRN Amin, Ankit Chirag, MD       sertraline (ZOLOFT) tablet 100 mg  100 mg Oral Daily Irene Pap N, DO   100 mg at 10/04/22 0843   traZODone (DESYREL) tablet 50 mg  50 mg Oral QHS PRN Damita Lack, MD   50 mg at 10/03/22 2129     Discharge Medications: Please see discharge summary for a list of discharge medications.  Relevant Imaging Results:  Relevant Lab Results:   Additional Information SSN [id number]  [place], LCSW

## 2022-10-04 NOTE — Progress Notes (Signed)
Per Dr. Reesa Chew, it is ok to place patient on Dysphagia 1 diet with nectar thick fluids.

## 2022-10-05 DIAGNOSIS — G9341 Metabolic encephalopathy: Secondary | ICD-10-CM | POA: Diagnosis not present

## 2022-10-05 LAB — GLUCOSE, CAPILLARY
Glucose-Capillary: 329 mg/dL — ABNORMAL HIGH (ref 70–99)
Glucose-Capillary: 226 mg/dL — ABNORMAL HIGH (ref 70–99)
Glucose-Capillary: 241 mg/dL — ABNORMAL HIGH (ref 70–99)
Glucose-Capillary: 218 mg/dL — ABNORMAL HIGH (ref 70–99)
Glucose-Capillary: 210 mg/dL — ABNORMAL HIGH (ref 70–99)
Glucose-Capillary: 247 mg/dL — ABNORMAL HIGH (ref 70–99)
Glucose-Capillary: 257 mg/dL — ABNORMAL HIGH (ref 70–99)

## 2022-10-05 LAB — CBC
HCT: 30.6 % — ABNORMAL LOW (ref 36.0–46.0)
Hemoglobin: 9.1 g/dL — ABNORMAL LOW (ref 12.0–15.0)
MCH: 26.9 pg (ref 26.0–34.0)
MCHC: 29.7 g/dL — ABNORMAL LOW (ref 30.0–36.0)
MCV: 90.5 fL (ref 80.0–100.0)
Platelets: 251 10*3/uL (ref 150–400)
RBC: 3.38 MIL/uL — ABNORMAL LOW (ref 3.87–5.11)
RDW: 13.7 % (ref 11.5–15.5)
WBC: 5.5 10*3/uL (ref 4.0–10.5)
nRBC: 0 % (ref 0.0–0.2)

## 2022-10-05 LAB — BASIC METABOLIC PANEL
Anion gap: 10 (ref 5–15)
BUN: 13 mg/dL (ref 8–23)
CO2: 27 mmol/L (ref 22–32)
Calcium: 8.3 mg/dL — ABNORMAL LOW (ref 8.9–10.3)
Chloride: 101 mmol/L (ref 98–111)
Creatinine, Ser: 1.05 mg/dL — ABNORMAL HIGH (ref 0.44–1.00)
GFR, Estimated: 50 mL/min — ABNORMAL LOW (ref >60)
Glucose, Bld: 256 mg/dL — ABNORMAL HIGH (ref 70–99)
Potassium: 3.5 mmol/L (ref 3.5–5.1)
Sodium: 138 mmol/L (ref 135–145)

## 2022-10-05 LAB — MAGNESIUM: Magnesium: 1.6 mg/dL — ABNORMAL LOW (ref 1.7–2.4)

## 2022-10-05 MED ORDER — LISINOPRIL 10 MG PO TABS
20.0000 mg | ORAL_TABLET | Freq: Every day | ORAL | Status: DC
  Administered 2022-10-05 – 2022-10-07 (×3): 20 mg via ORAL
  Filled 2022-10-05 (×3): qty 2

## 2022-10-05 MED ORDER — MAGNESIUM SULFATE 2 GM/50ML IV SOLN
2.0000 g | Freq: Once | INTRAVENOUS | Status: AC
  Administered 2022-10-05: 2 g via INTRAVENOUS
  Filled 2022-10-05: qty 50

## 2022-10-05 MED ORDER — CHLORHEXIDINE GLUCONATE CLOTH 2 % EX PADS
6.0000 | MEDICATED_PAD | Freq: Every day | CUTANEOUS | Status: DC
  Administered 2022-10-05 – 2022-10-08 (×4): 6 via TOPICAL

## 2022-10-05 MED ORDER — DEXTROSE-NACL 5-0.45 % IV SOLN: INTRAVENOUS | Status: AC

## 2022-10-05 NOTE — TOC Progression Note (Addendum)
Transition of Care [facility]) - Progression Note    Patient Details  Name: Renee [name] MRN: [id number] Date of Birth: 1928/10/31  [facility]) CM/SW [place], LCSW Phone Number: 10/05/2022, 1:56 PM  Clinical Narrative:    CSW emailed updated DNR from as well as presented bed offers. CSW will await response from LG on bed choice.TOC to follow.    ADDEN 3:30pm CSW received email from pt's LG on bed choice. [facility] was chosen.  CSW received a message from MD, still discussing goals of care. TOC  to follow.     Expected Discharge Plan: Skilled Nursing Facility Barriers to Discharge: Continued Medical Work up  Expected Discharge Plan and Services   Discharge Planning Services: CM Consult Post Acute Care Choice: Durable Medical Equipment, Resumption of Svcs/PTA Provider ([place] w/ [facility]; Home oxygen w/ [employer]; walker, BSC, shower chair) Living arrangements for the past 2 months: Single Family Home                   DME Agency: [employer] Date DME Agency Contacted: 09/27/22 Time DME Agency Contacted: [id number] Representative spoke with at DME Agency: [place]on Grills HH Arranged: PT [place]: [place] Date [place]: 09/27/22 Time [place]: 1433 Representative spoke with at [place]: [place] Determinants of Health ([place]) Interventions [place]: No Food Insecurity (09/27/2022)  Housing: [place]  (09/27/2022)  Transportation Needs: No Transportation Needs (09/27/2022)  Utilities: Not At Risk (09/27/2022)  Alcohol Screen: Low Risk  (05/30/2022)  Depression (PHQ2-9): Low Risk  (05/30/2022)  Financial Resource Strain: Low Risk  (05/30/2022)  Physical Activity: Inactive (05/30/2022)  Social Connections: Unknown (05/30/2022)  Stress: No Stress Concern Present (05/30/2022)  Tobacco Use: Low Risk  (10/01/2022)    Readmission Risk Interventions    09/27/2022    2:24 PM 06/10/2020     4:24 PM  Readmission Risk Prevention Plan  Transportation Screening Complete   PCP or Specialist Appt within 3-5 Days Complete   HRI or [place] Complete   Social Work Consult for [place] Planning/Counseling [place] Not Applicable   Medication Review Press photographer) Complete   PCP or Specialist appointment within 3-5 days of discharge  Complete

## 2022-10-05 NOTE — Progress Notes (Signed)
CSW has placed copies of the patient's DNR in her folder. Provider made aware as well the nurse.

## 2022-10-05 NOTE — Progress Notes (Signed)
PROGRESS NOTE    Renee [name]  [id number] DOB: 1928-10-14 DOA: 09/26/2022 PCP: Girtha Rm, NP-C   Brief Narrative:  89 year old with history of previous CVA with left-sided hemiparesis and dysphagia, chronic diastolic CHF EF [id number] with grade 2 DD, chronic nocturnal hypoxia, recent UTI on antibiotics, laryngeal reflux disease, chronic otitis media with effusion status post ear tube placement, physical deconditioning, depression/anxiety, mobility impairment, sent from PCP office for lethargy.  Prior to admission went to urgent care on 2/3 and found to have Proteus UTI treated with Keflex and Levaquin.  In the ED was noted to have acute CHF requiring IV Lasix and patient was admitted.  During hospitalization patient was noted to have recurrent aspiration requiring antibiotic treatment.  Also was diagnosed with Enterococcus urinary tract infection initially required linezolid thereafter 1 dose of fosfomycin.  During this time patient has been noted to be incompetent therefore court has assigned a guardianship.  Case has been filed against previous caretaker for APS. DDS DNR form was signed.   Assessment & Plan:  Principal Problem:   Acute metabolic encephalopathy Active Problems:   Acute respiratory failure with hypoxia (HCC)   Complicated UTI (urinary tract infection)   Aspiration pneumonia of both lower lobes due to gastric secretions (HCC)   Dysphagia   Acute on chronic diastolic CHF (congestive heart failure) (HCC)   Type 2 diabetes mellitus with stage 3b chronic kidney disease, without long-term current use of insulin (HCC)   Closed fracture of two ribs, right, initial encounter   Chronic kidney disease, stage 3b ([place])   Essential hypertension   GERD without esophagitis   Hyperkalemia   Hemiplegia, late effect of cerebrovascular disease (HCC)   History of CVA (cerebrovascular accident)   CKD (chronic kidney disease) stage 4, GFR 15-29 ml/min (HCC)   Asthma, chronic, unspecified  asthma severity, with acute exacerbation   Obesity (BMI 30-39.9)   Chronic respiratory failure with hypoxia (HCC)     Assessment and Plan: * Acute metabolic encephalopathy - Mentation fluctuating but overall stable.  Combination of aspiration pneumonia, UTI and hypoglycemia. - Continue Unasyn - MRI brain 2/11-negative.  TSH, VBG, ammonia, B12 and UDS negative.  EEG unremarkable - At baseline apparently patient is awake alert oriented and follows basic commands.  Complicated UTI (urinary tract infection) -Urine culture 2/3-Proteus, 2/7-VRE Enterococcus faecium. - On Unasyn, received cefepime/Flagyl then transition to linezolid hide thereafter 1 dose of fosfomycin 2/13  Acute respiratory failure with hypoxia ([place]), from aspiration pneumonia -As needed bronchodilators.  Also had received intermittent Lasix  Aspiration pneumonia of both lower lobes due to gastric secretions ([place]) Dysphagia -Unfortunately patient has had multiple witnessed episodes of aspiration.  I anticipate this to be a recurrent issue.  Confirmed by speech and swallow.  MBS performed.  Dysphagia 1 diet.  Cultures remain negative.  Acute on chronic diastolic CHF (congestive heart failure) (HCC) -Echocardiogram 2023 showed EF of 65%, grade 2 DD.  Intermittently receiving IV Lasix.  Type 2 diabetes mellitus with stage 3b chronic kidney disease, without long-term current use of insulin (HCC) Recurrent hypoglycemic episodes - A1c 9.7.  Very poor oral intake with risk of hypoglycemia and therefore all the insulin discontinued.  Discussed with family, okay with slight hyperglycemia to avoid symptomatic and more life-threatening hypoglycemic episodes.  Closed fracture of two ribs, right, initial encounter, right 10th and 11th rib -No other obvious physical trauma noted.  Pain control.  Vitamin D normal 1 month ago  Chronic kidney disease, stage 3b ([place]) -  Creatinine remains around baseline of 1.2.   Essential  hypertension, Uncontrolled, labile -Currently on Norvasc, Coreg, lisinopril. Add hydralazine 25 mg 3 times daily. Getting IV as needed  GERD without esophagitis -PPI  After my extensive discussion with patient's grandnephew and his wife on 2/15 and [name] on 2/16.  My recommendations would be transition patient to hospice/comfort care given her extensive medical history, risk of recurrent aspiration and malnourishment with poor oral intake.  Most of these patients condition are not reversible and we should really shift our focus to comfort.  Patient's family and [name] is in agreement but at this time we will need official approval from guardianship administration.  PT/OT-SNF  DVT prophylaxis: Lovenox Code Status: Full code but patient wishes to be DNR, DDS DNR paperwork signed.  Awaiting final approval from the guardianship office Family Communication: Called [name]  Status is: Inpatient Continue hospital stay    Subjective: Seen and examined at bedside.  More alert this morning.  No complaints.  Examination: Constitutional: Not in acute distress, elderly frail Respiratory: Clear to auscultation bilaterally Cardiovascular: Normal sinus rhythm, no rubs Abdomen: Nontender nondistended good bowel sounds Musculoskeletal: No edema noted Skin: No rashes seen Neurologic: CN 2-12 grossly intact.  And nonfocal Psychiatric: Poor judgment and insight.  Alert to name and place  Objective: Vitals:   10/05/22 0310 10/05/22 0334 10/05/22 0400 10/05/22 0600  BP: (!) 191/68  (!) 158/53 (!) 125/30  Pulse: 79  67 66  Resp: (!) 21  18 17  $ Temp: 98 F (36.7 C)     TempSrc: Axillary     SpO2: 99%  99% 97%  Weight:  73.4 kg    Height:        Intake/Output Summary (Last 24 hours) at 10/05/2022 0728 Last data filed at 10/05/2022 0700 Gross per 24 hour  Intake 1339.31 ml  Output 200 ml  Net 1139.31 ml   Filed Weights   10/02/22 1803 10/04/22 0500 10/05/22 0334   Weight: 73.7 kg 73.5 kg 73.4 kg     Data Reviewed:   CBC: Recent Labs  Lab 09/29/22 0540 09/30/22 0500 10/01/22 0424 10/02/22 0503 10/03/22 0252 10/04/22 0256 10/05/22 0321  WBC 13.9* 14.6* 8.7 5.9 6.4 5.1 5.5  NEUTROABS 12.0* 12.7* 6.6 3.8 4.8  --   --   HGB 8.8* 9.1* 8.7* 8.5* 9.5* 8.3* 9.1*  HCT 29.6* 31.4* 28.9* 29.0* 32.4* 28.1* 30.6*  MCV 92.5 95.4 92.6 92.4 92.8 92.1 90.5  PLT 208 212 197 208 275 231 [id number]   Basic Metabolic Panel: Recent Labs  Lab 09/28/22 1224 09/29/22 0540 10/01/22 0424 10/02/22 0503 10/02/22 1859 10/03/22 0252 10/04/22 0256 10/05/22 0321  NA 132*   < > 133* 135 136 136 137 138  K 4.6   < > 4.5 3.8 3.9 3.6 3.6 3.5  CL 101   < > 100 98 100 99 100 101  CO2 22   < > 25 28 26 27 29 27  $ GLUCOSE 217*   < > 149* 120* 235* 110* 232* 256*  BUN 22   < > 17 14 17 21 15 13  $ CREATININE 1.42*   < > 1.21* 1.10* 1.29* 1.25* 1.05* 1.05*  CALCIUM 7.9*   < > 8.1* 7.9* 8.1* 8.1* 8.0* 8.3*  MG  --    < > 1.7 1.7  --  1.8 1.7 1.6*  PHOS 2.9  --   --   --   --   --   --   --    < > =  values in this interval not displayed.   GFR: Estimated Creatinine Clearance: 33.5 mL/min (A) (by C-G formula based on SCr of 1.05 mg/dL (H)). Liver Function Tests: Recent Labs  Lab 09/29/22 0540 09/30/22 0500 10/01/22 0424 10/02/22 0503 10/03/22 0252  AST 22 24 33 31 38  ALT 20 20 19 19 25  $ ALKPHOS 211* 221* 170* 160* 186*  BILITOT 0.3 0.7 0.6 0.4 0.5  PROT 5.2* 5.4* 5.1* 4.8* 5.8*  ALBUMIN 2.6* 2.7* 2.7* 2.4* 3.0*   No results for input(s): "LIPASE", "AMYLASE" in the last 168 hours.  No results for input(s): "AMMONIA" in the last 168 hours.  Coagulation Profile: No results for input(s): "INR", "PROTIME" in the last 168 hours. Cardiac Enzymes: No results for input(s): "CKTOTAL", "CKMB", "CKMBINDEX", "TROPONINI" in the last 168 hours. BNP (last 3 results) Recent Labs    07/04/22 1140 08/24/22 1426  PROBNP 161.0* 298.0*   HbA1C: No results for input(s):  "HGBA1C" in the last 72 hours. CBG: Recent Labs  Lab 10/04/22 1614 10/04/22 1946 10/04/22 2153 10/04/22 2354 10/05/22 0320  GLUCAP 248* 329* 303* 260* 226*   Lipid Profile: No results for input(s): "CHOL", "HDL", "LDLCALC", "TRIG", "CHOLHDL", "LDLDIRECT" in the last 72 hours. Thyroid Function Tests: No results for input(s): "TSH", "T4TOTAL", "FREET4", "T3FREE", "THYROIDAB" in the last 72 hours. Anemia Panel: No results for input(s): "VITAMINB12", "FOLATE", "FERRITIN", "TIBC", "IRON", "RETICCTPCT" in the last 72 hours. Sepsis Labs: Recent Labs  Lab 09/29/22 0540 10/02/22 2047 10/02/22 2200  PROCALCITON 0.28  --   --   LATICACIDVEN  --  0.7 0.9    Recent Results (from the past 240 hour(s))  Blood culture (routine x 2)     Status: None   Collection Time: 09/26/22  3:13 PM   Specimen: BLOOD LEFT HAND  Result Value Ref Range Status   Specimen Description   Final    BLOOD LEFT HAND Performed at [facility], [place] [address], [place], [place] [address]    Special Requests   Final    BOTTLES DRAWN AEROBIC AND ANAEROBIC Blood Culture results may not be optimal due to an inadequate volume of blood received in culture bottles Performed at [facility] [address] [place], [place] [address]    Culture   Final    NO GROWTH 5 DAYS Performed at [facility], [place] [address] [place], [place] [address]    Report Status 10/01/2022 FINAL  Final  Blood culture (routine x 2)     Status: None   Collection Time: 09/26/22  3:33 PM   Specimen: BLOOD  Result Value Ref Range Status   Specimen Description   Final    BLOOD RIGHT ANTECUBITAL Performed at [facility] [address] [place], [place] [address]    Special Requests   Final    BOTTLES DRAWN AEROBIC AND ANAEROBIC Blood Culture adequate volume Performed at [facility] [address] [place], [place] [address]    Culture   Final    NO GROWTH 5  DAYS Performed at [facility], [place] [address] [place], [place] [address]    Report Status 10/01/2022 FINAL  Final  Urine Culture (for pregnant, neutropenic or urologic patients or patients with an indwelling urinary catheter)     Status: Abnormal   Collection Time: 09/26/22  4:00 PM   Specimen: Urine, Clean Catch  Result Value Ref Range Status   Specimen Description   Final  URINE, CLEAN CATCH Performed at [facility], [place] [address], [place], [place] [address]    Special Requests   Final    NONE Performed at [facility], [place] [address], [place], [place] [address]    Culture (A)  Final    20,000 COLONIES/mL ENTEROCOCCUS FAECIUM VANCOMYCIN RESISTANT ENTEROCOCCUS    Report Status 09/29/2022 FINAL  Final   Organism ID, Bacteria ENTEROCOCCUS FAECIUM (A)  Final      Susceptibility   Enterococcus faecium - MIC*    AMPICILLIN >=32 RESISTANT Resistant     NITROFURANTOIN 64 INTERMEDIATE Intermediate     VANCOMYCIN >=32 RESISTANT Resistant     LINEZOLID 2 SENSITIVE Sensitive     * 20,000 COLONIES/mL ENTEROCOCCUS FAECIUM  Resp panel by RT-PCR (RSV, Flu A&B, Covid) Anterior Nasal Swab     Status: None   Collection Time: 09/26/22  4:39 PM   Specimen: Anterior Nasal Swab  Result Value Ref Range Status   SARS Coronavirus 2 by RT PCR NEGATIVE NEGATIVE Final    Comment: (NOTE) SARS-CoV-2 target nucleic acids are NOT DETECTED.  The SARS-CoV-2 RNA is generally detectable in upper respiratory specimens during the acute phase of infection. The lowest concentration of SARS-CoV-2 viral copies this assay can detect is 138 copies/mL. A negative result does not preclude SARS-Cov-2 infection and should not be used as the sole basis for treatment or other patient management decisions. A negative result may occur with  improper specimen collection/handling, submission of specimen other than nasopharyngeal swab, presence of viral mutation(s) within  the areas targeted by this assay, and inadequate number of viral copies(<138 copies/mL). A negative result must be combined with clinical observations, patient history, and epidemiological information. The expected result is Negative.  Fact Sheet for Patients:  EntrepreneurPulse.com.au  Fact Sheet for Healthcare Providers:  IncredibleEmployment.be  This test is no t yet approved or cleared by the [place] FDA and  has been authorized for detection and/or diagnosis of SARS-CoV-2 by FDA under an Emergency Use Authorization (EUA). This EUA will remain  in effect (meaning this test can be used) for the duration of the COVID-19 declaration under Section 564(b)(1) of the Act, 21 U.S.C.section 360bbb-3(b)(1), unless the authorization is terminated  or revoked sooner.       Influenza A by PCR NEGATIVE NEGATIVE Final   Influenza B by PCR NEGATIVE NEGATIVE Final    Comment: (NOTE) The Xpert Xpress SARS-CoV-2/FLU/RSV plus assay is intended as an aid in the diagnosis of influenza from Nasopharyngeal swab specimens and should not be used as a sole basis for treatment. Nasal washings and aspirates are unacceptable for Xpert Xpress SARS-CoV-2/FLU/RSV testing.  Fact Sheet for Patients: EntrepreneurPulse.com.au  Fact Sheet for Healthcare Providers: IncredibleEmployment.be  This test is not yet approved or cleared by the [place] FDA and has been authorized for detection and/or diagnosis of SARS-CoV-2 by FDA under an Emergency Use Authorization (EUA). This EUA will remain in effect (meaning this test can be used) for the duration of the COVID-19 declaration under Section 564(b)(1) of the Act, 21 U.S.C. section 360bbb-3(b)(1), unless the authorization is terminated or revoked.     Resp Syncytial Virus by PCR NEGATIVE NEGATIVE Final    Comment: (NOTE) Fact Sheet for  Patients: EntrepreneurPulse.com.au  Fact Sheet for Healthcare Providers: IncredibleEmployment.be  This test is not yet approved or cleared by the [place] FDA and has been authorized for detection and/or diagnosis of SARS-CoV-2 by FDA under an Emergency Use Authorization (EUA). This  EUA will remain in effect (meaning this test can be used) for the duration of the COVID-19 declaration under Section 564(b)(1) of the Act, 21 U.S.C. section 360bbb-3(b)(1), unless the authorization is terminated or revoked.  Performed at [facility], [place] [address] [place], [place] [address]   Respiratory (~20 pathogens) panel by PCR     Status: None   Collection Time: 09/26/22  5:18 PM   Specimen: Nasopharyngeal Swab; Respiratory  Result Value Ref Range Status   Adenovirus NOT DETECTED NOT DETECTED Final   Coronavirus 229E NOT DETECTED NOT DETECTED Final    Comment: (NOTE) The Coronavirus on the Respiratory Panel, DOES NOT test for the novel  Coronavirus (2019 nCoV)    Coronavirus HKU1 NOT DETECTED NOT DETECTED Final   Coronavirus NL63 NOT DETECTED NOT DETECTED Final   Coronavirus OC43 NOT DETECTED NOT DETECTED Final   Metapneumovirus NOT DETECTED NOT DETECTED Final   Rhinovirus / Enterovirus NOT DETECTED NOT DETECTED Final   Influenza A NOT DETECTED NOT DETECTED Final   Influenza B NOT DETECTED NOT DETECTED Final   Parainfluenza Virus 1 NOT DETECTED NOT DETECTED Final   Parainfluenza Virus 2 NOT DETECTED NOT DETECTED Final   Parainfluenza Virus 3 NOT DETECTED NOT DETECTED Final   Parainfluenza Virus 4 NOT DETECTED NOT DETECTED Final   Respiratory Syncytial Virus NOT DETECTED NOT DETECTED Final   Bordetella pertussis NOT DETECTED NOT DETECTED Final   Bordetella Parapertussis NOT DETECTED NOT DETECTED Final   Chlamydophila pneumoniae NOT DETECTED NOT DETECTED Final   Mycoplasma pneumoniae NOT DETECTED NOT DETECTED Final    Comment:  Performed at [facility], [place]. [address], [place], [place] [address]  MRSA Next Gen by PCR, Nasal     Status: None   Collection Time: 10/02/22  5:24 PM   Specimen: Nasal Mucosa; Nasal Swab  Result Value Ref Range Status   MRSA by PCR Next Gen NOT DETECTED NOT DETECTED Final    Comment: (NOTE) The GeneXpert MRSA Assay (FDA approved for NASAL specimens only), is one component of a comprehensive MRSA colonization surveillance program. It is not intended to diagnose MRSA infection nor to guide or monitor treatment for MRSA infections. Test performance is not FDA approved in patients less than 36 years old. Performed at [facility], [place] [address], [place], [place] [address]   Culture, blood (Routine X 2) w Reflex to ID Panel     Status: None (Preliminary result)   Collection Time: 10/02/22  8:47 PM   Specimen: BLOOD  Result Value Ref Range Status   Specimen Description   Final    BLOOD RIGHT ANTECUBITAL Performed at [facility] [address], [place], [place] [address]    Special Requests   Final    BOTTLES DRAWN AEROBIC ONLY Blood Culture adequate volume Performed at [facility] [address] [place], [place] [address]    Culture   Final    NO GROWTH 2 DAYS Performed at [facility] [address] [place], [place] [address]    Report Status PENDING  Incomplete  Culture, blood (Routine X 2) w Reflex to ID Panel     Status: None (Preliminary result)   Collection Time: 10/02/22  8:47 PM   Specimen: BLOOD  Result Value Ref Range Status   Specimen Description   Final    BLOOD BLOOD LEFT HAND Performed at [facility] [address] [place],  [address]  Special Requests   Final    BOTTLES DRAWN AEROBIC ONLY Blood Culture results may not be optimal due to an inadequate volume of blood received in culture bottles Performed at [facility] [address] [place], [place] [address]    Culture   Final    NO GROWTH 2 DAYS Performed at [facility] [address] [place],  [address]    Report Status PENDING  Incomplete         Radiology Studies: No results found.      Scheduled Meds:  amLODipine  10 mg Oral Daily   aspirin  325 mg Oral Daily   carvedilol  3.125 mg Oral BID WC   Chlorhexidine Gluconate Cloth  6 each Topical Q0600   cholecalciferol  1,000 Units Oral Daily   cyanocobalamin  1,000 mcg Oral Daily   docusate sodium  200 mg Oral QHS   enoxaparin (LOVENOX) injection  40 mg Subcutaneous Q24H   famotidine  20 mg Oral BID   feeding supplement (GLUCERNA SHAKE)  237 mL Oral TID BM   fluticasone  2 spray Each Nare QHS   hydrALAZINE  25 mg Oral Q8H   lisinopril  20 mg Oral Daily   melatonin  10 mg Oral QHS   mirtazapine  15 mg Oral QHS   multivitamin with minerals  1 tablet Oral Daily   mouth rinse  15 mL Mouth Rinse 4 times per day   pantoprazole  40 mg Oral Daily   sertraline  100 mg Oral Daily   Continuous Infusions:  ampicillin-sulbactam (UNASYN) IV Stopped (10/05/22 0538)   dextrose 5 % and 0.45% NaCl 25 mL/hr at 10/05/22 0700     LOS: 9 days   Time spent= 35 mins    Charlis Harner Arsenio Loader, MD [facility]  If 7PM-7AM, please contact night-coverage  10/05/2022, 7:28 AM

## 2022-10-05 NOTE — Progress Notes (Signed)
PT Cancellation Note  Patient Details Name: Renee [name] MRN: [id number] DOB: 1929-06-15   Cancelled Treatment:     PT deferred this date on advice of RN 2* pt condition.  Will follow on Monday dependent on goals of care.   Hartley Wyke 10/05/2022, 11:38 AM

## 2022-10-06 DIAGNOSIS — G9341 Metabolic encephalopathy: Secondary | ICD-10-CM | POA: Diagnosis not present

## 2022-10-06 LAB — BASIC METABOLIC PANEL
Anion gap: 3 — ABNORMAL LOW (ref 5–15)
BUN: 13 mg/dL (ref 8–23)
CO2: 28 mmol/L (ref 22–32)
Calcium: 7.7 mg/dL — ABNORMAL LOW (ref 8.9–10.3)
Chloride: 101 mmol/L (ref 98–111)
Creatinine, Ser: 0.96 mg/dL (ref 0.44–1.00)
GFR, Estimated: 56 mL/min — ABNORMAL LOW (ref >60)
Glucose, Bld: 275 mg/dL — ABNORMAL HIGH (ref 70–99)
Potassium: 3.5 mmol/L (ref 3.5–5.1)
Sodium: 132 mmol/L — ABNORMAL LOW (ref 135–145)

## 2022-10-06 LAB — CBC
HCT: 25.8 % — ABNORMAL LOW (ref 36.0–46.0)
Hemoglobin: 7.7 g/dL — ABNORMAL LOW (ref 12.0–15.0)
MCH: 27.3 pg (ref 26.0–34.0)
MCHC: 29.8 g/dL — ABNORMAL LOW (ref 30.0–36.0)
MCV: 91.5 fL (ref 80.0–100.0)
Platelets: 186 10*3/uL (ref 150–400)
RBC: 2.82 MIL/uL — ABNORMAL LOW (ref 3.87–5.11)
RDW: 13.7 % (ref 11.5–15.5)
WBC: 3.8 10*3/uL — ABNORMAL LOW (ref 4.0–10.5)
nRBC: 0 % (ref 0.0–0.2)

## 2022-10-06 LAB — GLUCOSE, CAPILLARY
Glucose-Capillary: 225 mg/dL — ABNORMAL HIGH (ref 70–99)
Glucose-Capillary: 243 mg/dL — ABNORMAL HIGH (ref 70–99)
Glucose-Capillary: 260 mg/dL — ABNORMAL HIGH (ref 70–99)
Glucose-Capillary: 261 mg/dL — ABNORMAL HIGH (ref 70–99)
Glucose-Capillary: 261 mg/dL — ABNORMAL HIGH (ref 70–99)

## 2022-10-06 LAB — MAGNESIUM: Magnesium: 2.1 mg/dL (ref 1.7–2.4)

## 2022-10-06 NOTE — Progress Notes (Signed)
PROGRESS NOTE    Renee [name]  [id number] DOB: 04/30/1929 DOA: 09/26/2022 PCP: Renee Rm, NP-C   Brief Narrative:  89 year old with history of previous CVA with left-sided hemiparesis and dysphagia, chronic diastolic CHF EF [id number] with grade 2 DD, chronic nocturnal hypoxia, recent UTI on antibiotics, laryngeal reflux disease, chronic otitis media with effusion status post ear tube placement, physical deconditioning, depression/anxiety, mobility impairment, sent from PCP office for lethargy.  Prior to admission went to urgent care on 2/3 and found to have Proteus UTI treated with Keflex and Levaquin.  In the ED was noted to have acute CHF requiring IV Lasix and patient was admitted.  During hospitalization patient was noted to have recurrent aspiration requiring antibiotic treatment.  Also was diagnosed with Enterococcus urinary tract infection initially required linezolid thereafter 1 dose of fosfomycin.  During this time patient has been noted to be incompetent therefore court has assigned a guardianship.  Case has been filed against previous caretaker for APS. DDS DNR form was signed.  Official confirmation received from patient's guardian with signed and notarized paperwork, placed in chart.  CODE STATUS changed to DNR.  Assessment & Plan:  Principal Problem:   Acute metabolic encephalopathy Active Problems:   Acute respiratory failure with hypoxia (HCC)   Complicated UTI (urinary tract infection)   Aspiration pneumonia of both lower lobes due to gastric secretions (HCC)   Dysphagia   Acute on chronic diastolic CHF (congestive heart failure) (HCC)   Type 2 diabetes mellitus with stage 3b chronic kidney disease, without long-term current use of insulin (HCC)   Closed fracture of two ribs, right, initial encounter   Chronic kidney disease, stage 3b (Renee [name])   Essential hypertension   GERD without esophagitis   Hyperkalemia   Hemiplegia, late effect of cerebrovascular disease (HCC)    History of CVA (cerebrovascular accident)   CKD (chronic kidney disease) stage 4, GFR 15-29 ml/min (HCC)   Asthma, chronic, unspecified asthma severity, with acute exacerbation   Obesity (BMI 30-39.9)   Chronic respiratory failure with hypoxia (HCC)     Assessment and Plan: * Acute metabolic encephalopathy - Continues to fluctuate but no focal neurosigns.  Combination of aspiration pneumonia, UTI and hypoglycemia. - Continue Unasyn, total 7 days - MRI brain 2/11-negative.  TSH, VBG, ammonia, B12 and UDS negative.  EEG unremarkable - At baseline apparently patient is awake alert oriented and follows basic commands.  Complicated UTI (urinary tract infection) -Urine culture 2/3-Proteus, 2/7-VRE Enterococcus faecium. - On Unasyn, received cefepime/Flagyl then transition to linezolid hide thereafter 1 dose of fosfomycin 2/13  Acute respiratory failure with hypoxia (Renee [name]), from aspiration pneumonia -As needed bronchodilators.  Also had received intermittent Lasix  Aspiration pneumonia of both lower lobes due to gastric secretions (Renee [name]) Dysphagia -Unfortunately patient has had multiple witnessed episodes of aspiration.  I anticipate this to be a recurrent issue.  Confirmed by speech and swallow.  MBS performed.  Dysphagia 1 diet.  Cultures remain negative.  Acute on chronic diastolic CHF (congestive heart failure) (HCC) - Overall slightly clinically dehydrated with poor oral intake now.  Echocardiogram 2023 showed EF of 65%, grade 2 DD.  Intermittently receiving IV Lasix.  Acute on chronic anemia - Baseline hemoglobin 8.5, this morning 7.7.  No obvious signs of bleeding.  All cell lines have trended down, suspect dilutional  Type 2 diabetes mellitus with stage 3b chronic kidney disease, without long-term current use of insulin (HCC) Recurrent hypoglycemic episodes - A1c 9.7.  Very poor oral intake  with risk of hypoglycemia and therefore all the insulin discontinued.  Discussed with family,  okay with slight hyperglycemia to avoid symptomatic and more life-threatening hypoglycemic episodes.  Closed fracture of two ribs, right, initial encounter, right 10th and 11th rib -No other obvious physical trauma noted.  Pain control.  Vitamin D normal 1 month ago  Chronic kidney disease, stage 3b (HCC) -Creatinine remains around baseline of 1.2.   Essential hypertension, Uncontrolled, labile -Currently on Norvasc, Coreg, lisinopril. Hdralazine 25 mg 3 times daily. Getting IV as needed  GERD without esophagitis -PPI  After my extensive discussion with patient's grandnephew and his wife on 2/15 and Renee [name] on 2/16.  My recommendations would be transition patient to hospice/comfort care given her extensive medical history, risk of recurrent aspiration and malnourishment with poor oral intake.  Most of these patients condition are not reversible and we should really shift our focus to comfort.  Patient's family and Renee [name] is in agreement but at this time we will need official approval from guardianship administration.   PT/OT-SNF.  At this time goals would be to pursue hospice but will need official confirmation from legal guardianship office  DVT prophylaxis: Lovenox Code Status: Patient is now full DNR, DDS DNR paperwork signed and notarized Family Communication: Called Renee [name] on 2/16  Status is: Inpatient Continue hospital stay    Subjective: Feels ok. No complaints. Drwosy this morning, rested well yesterday Poor po intake.   Examination: Constitutional: Not in acute distress. Elderly frail.  Respiratory: minimal bibasilar rhonchi Cardiovascular: Normal sinus rhythm, no rubs Abdomen: Nontender nondistended good bowel sounds Musculoskeletal: No edema noted Skin: No rashes seen Neurologic: CN 2-12 grossly intact.  And nonfocal Psychiatric: Poor judgment and insight. Alert and oriented x 3. Normal mood.    Objective: Vitals:   10/06/22 0400 10/06/22  0415 10/06/22 0500 10/06/22 0600  BP: (!) 183/38 (!) 147/50  (!) 143/79  Pulse: 64 66  63  Resp: 15 16  16  $ Temp:      TempSrc:      SpO2: 100% 100%  100%  Weight:   73.2 kg   Height:        Intake/Output Summary (Last 24 hours) at 10/06/2022 0732 Last data filed at 10/06/2022 0037 Gross per 24 hour  Intake 771.35 ml  Output --  Net 771.35 ml   Filed Weights   10/04/22 0500 10/05/22 0334 10/06/22 0500  Weight: 73.5 kg 73.4 kg 73.2 kg     Data Reviewed:   CBC: Recent Labs  Lab 09/30/22 0500 10/01/22 0424 10/02/22 0503 10/03/22 0252 10/04/22 0256 10/05/22 0321 10/06/22 0259  WBC 14.6* 8.7 5.9 6.4 5.1 5.5 3.8*  NEUTROABS 12.7* 6.6 3.8 4.8  --   --   --   HGB 9.1* 8.7* 8.5* 9.5* 8.3* 9.1* 7.7*  HCT 31.4* 28.9* 29.0* 32.4* 28.1* 30.6* 25.8*  MCV 95.4 92.6 92.4 92.8 92.1 90.5 91.5  PLT 212 197 208 275 231 251 [id number]   Basic Metabolic Panel: Recent Labs  Lab 10/02/22 0503 10/02/22 1859 10/03/22 0252 10/04/22 0256 10/05/22 0321 10/06/22 0259  NA 135 136 136 137 138 132*  K 3.8 3.9 3.6 3.6 3.5 3.5  CL 98 100 99 100 101 101  CO2 28 26 27 29 27 28  $ GLUCOSE 120* 235* 110* 232* 256* 275*  BUN 14 17 21 15 13 13  $ CREATININE 1.10* 1.29* 1.25* 1.05* 1.05* 0.96  CALCIUM 7.9* 8.1* 8.1* 8.0* 8.3* 7.7*  MG 1.7  --  1.8 1.7 1.6* 2.1   GFR: Estimated Creatinine Clearance: 36.6 mL/min (by C-G formula based on SCr of 0.96 mg/dL). Liver Function Tests: Recent Labs  Lab 09/30/22 0500 10/01/22 0424 10/02/22 0503 10/03/22 0252  AST 24 33 31 38  ALT 20 19 19 25  $ ALKPHOS 221* 170* 160* 186*  BILITOT 0.7 0.6 0.4 0.5  PROT 5.4* 5.1* 4.8* 5.8*  ALBUMIN 2.7* 2.7* 2.4* 3.0*   No results for input(s): "LIPASE", "AMYLASE" in the last 168 hours.  No results for input(s): "AMMONIA" in the last 168 hours.  Coagulation Profile: No results for input(s): "INR", "PROTIME" in the last 168 hours. Cardiac Enzymes: No results for input(s): "CKTOTAL", "CKMB", "CKMBINDEX", "TROPONINI" in  the last 168 hours. BNP (last 3 results) Recent Labs    07/04/22 1140 08/24/22 1426  PROBNP 161.0* 298.0*   HbA1C: No results for input(s): "HGBA1C" in the last 72 hours. CBG: Recent Labs  Lab 10/05/22 1244 10/05/22 1701 10/05/22 1944 10/05/22 2312 10/06/22 0336  GLUCAP 218* 210* 247* 257* 225*   Lipid Profile: No results for input(s): "CHOL", "HDL", "LDLCALC", "TRIG", "CHOLHDL", "LDLDIRECT" in the last 72 hours. Thyroid Function Tests: No results for input(s): "TSH", "T4TOTAL", "FREET4", "T3FREE", "THYROIDAB" in the last 72 hours. Anemia Panel: No results for input(s): "VITAMINB12", "FOLATE", "FERRITIN", "TIBC", "IRON", "RETICCTPCT" in the last 72 hours. Sepsis Labs: Recent Labs  Lab 10/02/22 2047 10/02/22 2200  LATICACIDVEN 0.7 0.9    Recent Results (from the past 240 hour(s))  Blood culture (routine x 2)     Status: None   Collection Time: 09/26/22  3:13 PM   Specimen: BLOOD LEFT HAND  Result Value Ref Range Status   Specimen Description   Final    BLOOD LEFT HAND Performed at [facility], [place] [address] [place], [place] [address]    Special Requests   Final    BOTTLES DRAWN AEROBIC AND ANAEROBIC Blood Culture results may not be optimal due to an inadequate volume of blood received in culture bottles Performed at [place] [address], [place], [place] [address]    Culture   Final    NO GROWTH 5 DAYS Performed at [facility], [place] [address] [place], [place] [address]    Report Status 10/01/2022 FINAL  Final  Blood culture (routine x 2)     Status: None   Collection Time: 09/26/22  3:33 PM   Specimen: BLOOD  Result Value Ref Range Status   Specimen Description   Final    BLOOD RIGHT ANTECUBITAL Performed at [place] [address] [place], [place] [address]    Special Requests   Final    BOTTLES DRAWN AEROBIC AND ANAEROBIC Blood Culture adequate volume Performed at [place] [address], [place], [place] [address]    Culture   Final    NO GROWTH 5 DAYS Performed at [facility], [place] [address], [place], [place] [address]    Report Status 10/01/2022 FINAL  Final  Urine Culture (for pregnant, neutropenic or urologic patients or patients with an indwelling urinary catheter)     Status: Abnormal   Collection Time: 09/26/22  4:00 PM   Specimen: Urine, Clean Catch  Result Value Ref Range Status   Specimen Description   Final    URINE, CLEAN CATCH Performed at [facility], [place] [address] [place], [place] [address]    Special Requests   Final  NONE Performed at [facility], [place] [address], [place], [place] [address]    Culture (A)  Final    20,000 COLONIES/mL ENTEROCOCCUS FAECIUM VANCOMYCIN RESISTANT ENTEROCOCCUS    Report Status 09/29/2022 FINAL  Final   Organism ID, Bacteria ENTEROCOCCUS FAECIUM (A)  Final      Susceptibility   Enterococcus faecium - MIC*    AMPICILLIN >=32 RESISTANT Resistant     NITROFURANTOIN 64 INTERMEDIATE Intermediate     VANCOMYCIN >=32 RESISTANT Resistant     LINEZOLID 2 SENSITIVE Sensitive     * 20,000 COLONIES/mL ENTEROCOCCUS FAECIUM  Resp panel by RT-PCR (RSV, Flu A&B, Covid) Anterior Nasal Swab     Status: None   Collection Time: 09/26/22  4:39 PM   Specimen: Anterior Nasal Swab  Result Value Ref Range Status   SARS Coronavirus 2 by RT PCR NEGATIVE NEGATIVE Final    Comment: (NOTE) SARS-CoV-2 target nucleic acids are NOT DETECTED.  The SARS-CoV-2 RNA is generally detectable in upper respiratory specimens during the acute phase of infection. The lowest concentration of SARS-CoV-2 viral copies this assay can detect is 138 copies/mL. A negative result does not preclude SARS-Cov-2 infection and should not be used as the sole basis for treatment or other patient management decisions. A negative result may occur with  improper specimen collection/handling,  submission of specimen other than nasopharyngeal swab, presence of viral mutation(s) within the areas targeted by this assay, and inadequate number of viral copies(<138 copies/mL). A negative result must be combined with clinical observations, patient history, and epidemiological information. The expected result is Negative.  Fact Sheet for Patients:  EntrepreneurPulse.com.au  Fact Sheet for Healthcare Providers:  IncredibleEmployment.be  This test is no t yet approved or cleared by the [place] FDA and  has been authorized for detection and/or diagnosis of SARS-CoV-2 by FDA under an Emergency Use Authorization (EUA). This EUA will remain  in effect (meaning this test can be used) for the duration of the COVID-19 declaration under Section 564(b)(1) of the Act, 21 U.S.C.section 360bbb-3(b)(1), unless the authorization is terminated  or revoked sooner.       Influenza A by PCR NEGATIVE NEGATIVE Final   Influenza B by PCR NEGATIVE NEGATIVE Final    Comment: (NOTE) The Xpert Xpress SARS-CoV-2/FLU/RSV plus assay is intended as an aid in the diagnosis of influenza from Nasopharyngeal swab specimens and should not be used as a sole basis for treatment. Nasal washings and aspirates are unacceptable for Xpert Xpress SARS-CoV-2/FLU/RSV testing.  Fact Sheet for Patients: EntrepreneurPulse.com.au  Fact Sheet for Healthcare Providers: IncredibleEmployment.be  This test is not yet approved or cleared by the [place] FDA and has been authorized for detection and/or diagnosis of SARS-CoV-2 by FDA under an Emergency Use Authorization (EUA). This EUA will remain in effect (meaning this test can be used) for the duration of the COVID-19 declaration under Section 564(b)(1) of the Act, 21 U.S.C. section 360bbb-3(b)(1), unless the authorization is terminated or revoked.     Resp Syncytial Virus by PCR NEGATIVE  NEGATIVE Final    Comment: (NOTE) Fact Sheet for Patients: EntrepreneurPulse.com.au  Fact Sheet for Healthcare Providers: IncredibleEmployment.be  This test is not yet approved or cleared by the [place] FDA and has been authorized for detection and/or diagnosis of SARS-CoV-2 by FDA under an Emergency Use Authorization (EUA). This EUA will remain in effect (meaning this test can be used) for the duration of the COVID-19 declaration under Section 564(b)(1) of the Act, 21 U.S.C. section  360bbb-3(b)(1), unless the authorization is terminated or revoked.  Performed at [facility], [place] [address], [place], [place] [address]   Respiratory (~20 pathogens) panel by PCR     Status: None   Collection Time: 09/26/22  5:18 PM   Specimen: Nasopharyngeal Swab; Respiratory  Result Value Ref Range Status   Adenovirus NOT DETECTED NOT DETECTED Final   Coronavirus 229E NOT DETECTED NOT DETECTED Final    Comment: (NOTE) The Coronavirus on the Respiratory Panel, DOES NOT test for the novel  Coronavirus (2019 nCoV)    Coronavirus HKU1 NOT DETECTED NOT DETECTED Final   Coronavirus NL63 NOT DETECTED NOT DETECTED Final   Coronavirus OC43 NOT DETECTED NOT DETECTED Final   Metapneumovirus NOT DETECTED NOT DETECTED Final   Rhinovirus / Enterovirus NOT DETECTED NOT DETECTED Final   Influenza A NOT DETECTED NOT DETECTED Final   Influenza B NOT DETECTED NOT DETECTED Final   Parainfluenza Virus 1 NOT DETECTED NOT DETECTED Final   Parainfluenza Virus 2 NOT DETECTED NOT DETECTED Final   Parainfluenza Virus 3 NOT DETECTED NOT DETECTED Final   Parainfluenza Virus 4 NOT DETECTED NOT DETECTED Final   Respiratory Syncytial Virus NOT DETECTED NOT DETECTED Final   Bordetella pertussis NOT DETECTED NOT DETECTED Final   Bordetella Parapertussis NOT DETECTED NOT DETECTED Final   Chlamydophila pneumoniae NOT DETECTED NOT DETECTED Final   Mycoplasma  pneumoniae NOT DETECTED NOT DETECTED Final    Comment: Performed at [facility], [place]. [address] [place], [place] [address]  MRSA Next Gen by PCR, Nasal     Status: None   Collection Time: 10/02/22  5:24 PM   Specimen: Nasal Mucosa; Nasal Swab  Result Value Ref Range Status   MRSA by PCR Next Gen NOT DETECTED NOT DETECTED Final    Comment: (NOTE) The GeneXpert MRSA Assay (FDA approved for NASAL specimens only), is one component of a comprehensive MRSA colonization surveillance program. It is not intended to diagnose MRSA infection nor to guide or monitor treatment for MRSA infections. Test performance is not FDA approved in patients less than 92 years old. Performed at [facility], [place] [address], [place], [place] [address]   Culture, blood (Routine X 2) w Reflex to ID Panel     Status: None (Preliminary result)   Collection Time: 10/02/22  8:47 PM   Specimen: BLOOD  Result Value Ref Range Status   Specimen Description   Final    BLOOD RIGHT ANTECUBITAL Performed at [place] [address], [place], [place] [address]    Special Requests   Final    BOTTLES DRAWN AEROBIC ONLY Blood Culture adequate volume Performed at [place] [address] [place], [place] [address]    Culture   Final    NO GROWTH 3 DAYS Performed at [facility], [place] [address], [place], [place] [address]    Report Status PENDING  Incomplete  Culture, blood (Routine X 2) w Reflex to ID Panel     Status: None (Preliminary result)   Collection Time: 10/02/22  8:47 PM   Specimen: BLOOD  Result Value Ref Range Status   Specimen Description   Final    BLOOD BLOOD LEFT HAND Performed at [place] [address], [place],  [address]    Special Requests   Final    BOTTLES DRAWN AEROBIC ONLY Blood Culture results may not be optimal due to an inadequate volume of blood  received in culture bottles Performed  at [facility], [place] [address], [place], [place] [address]    Culture   Final    NO GROWTH 3 DAYS Performed at [facility], [place] [address] [place], [place] [address]    Report Status PENDING  Incomplete         Radiology Studies: No results found.      Scheduled Meds:  amLODipine  10 mg Oral Daily   aspirin  325 mg Oral Daily   carvedilol  3.125 mg Oral BID WC   Chlorhexidine Gluconate Cloth  6 each Topical Q0600   cholecalciferol  1,000 Units Oral Daily   cyanocobalamin  1,000 mcg Oral Daily   docusate sodium  200 mg Oral QHS   enoxaparin (LOVENOX) injection  40 mg Subcutaneous Q24H   famotidine  20 mg Oral BID   feeding supplement (GLUCERNA SHAKE)  237 mL Oral TID BM   fluticasone  2 spray Each Nare QHS   hydrALAZINE  25 mg Oral Q8H   lisinopril  20 mg Oral QHS   melatonin  10 mg Oral QHS   mirtazapine  15 mg Oral QHS   multivitamin with minerals  1 tablet Oral Daily   mouth rinse  15 mL Mouth Rinse 4 times per day   sertraline  100 mg Oral Daily   Continuous Infusions:  ampicillin-sulbactam (UNASYN) IV 3 g (10/06/22 0643)   dextrose 5 % and 0.45% NaCl 75 mL/hr at 10/06/22 0037     LOS: 10 days   Time spent= 35 mins    Amory Zbikowski Arsenio Loader, MD [facility]  If 7PM-7AM, please contact night-coverage  10/06/2022, 7:32 AM

## 2022-10-07 DIAGNOSIS — G9341 Metabolic encephalopathy: Secondary | ICD-10-CM | POA: Diagnosis not present

## 2022-10-07 LAB — CBC
HCT: 32.2 % — ABNORMAL LOW (ref 36.0–46.0)
Hemoglobin: 9.3 g/dL — ABNORMAL LOW (ref 12.0–15.0)
MCH: 26.7 pg (ref 26.0–34.0)
MCHC: 28.9 g/dL — ABNORMAL LOW (ref 30.0–36.0)
MCV: 92.5 fL (ref 80.0–100.0)
Platelets: 199 10*3/uL (ref 150–400)
RBC: 3.48 MIL/uL — ABNORMAL LOW (ref 3.87–5.11)
RDW: 13.8 % (ref 11.5–15.5)
WBC: 3.9 10*3/uL — ABNORMAL LOW (ref 4.0–10.5)
nRBC: 0 % (ref 0.0–0.2)

## 2022-10-07 LAB — MAGNESIUM: Magnesium: 2 mg/dL (ref 1.7–2.4)

## 2022-10-07 LAB — BASIC METABOLIC PANEL
Anion gap: 8 (ref 5–15)
BUN: 10 mg/dL (ref 8–23)
CO2: 27 mmol/L (ref 22–32)
Calcium: 8.2 mg/dL — ABNORMAL LOW (ref 8.9–10.3)
Chloride: 100 mmol/L (ref 98–111)
Creatinine, Ser: 1.02 mg/dL — ABNORMAL HIGH (ref 0.44–1.00)
GFR, Estimated: 52 mL/min — ABNORMAL LOW (ref >60)
Glucose, Bld: 251 mg/dL — ABNORMAL HIGH (ref 70–99)
Potassium: 3.9 mmol/L (ref 3.5–5.1)
Sodium: 135 mmol/L (ref 135–145)

## 2022-10-07 LAB — CULTURE, BLOOD (ROUTINE X 2)
Culture: NO GROWTH
Culture: NO GROWTH
Special Requests: ADEQUATE

## 2022-10-07 LAB — GLUCOSE, CAPILLARY
Glucose-Capillary: 205 mg/dL — ABNORMAL HIGH (ref 70–99)
Glucose-Capillary: 222 mg/dL — ABNORMAL HIGH (ref 70–99)
Glucose-Capillary: 230 mg/dL — ABNORMAL HIGH (ref 70–99)
Glucose-Capillary: 235 mg/dL — ABNORMAL HIGH (ref 70–99)
Glucose-Capillary: 255 mg/dL — ABNORMAL HIGH (ref 70–99)

## 2022-10-07 MED ORDER — HYDRALAZINE HCL 50 MG PO TABS
50.0000 mg | ORAL_TABLET | Freq: Three times a day (TID) | ORAL | Status: DC
  Administered 2022-10-07 – 2022-10-08 (×3): 50 mg via ORAL
  Filled 2022-10-07 (×3): qty 1

## 2022-10-07 MED ORDER — OLOPATADINE HCL 0.1 % OP SOLN
1.0000 [drp] | Freq: Two times a day (BID) | OPHTHALMIC | Status: DC | PRN
  Filled 2022-10-07: qty 5

## 2022-10-07 NOTE — Progress Notes (Signed)
CBG 222. Dr. Reesa Chew notified. Per MD order, Notified provider if cbg over 350. No coverage at this time.

## 2022-10-07 NOTE — Progress Notes (Signed)
PROGRESS NOTE    Renee [name]  [id number] DOB: Sep 14, 1928 DOA: 09/26/2022 PCP: Girtha Rm, NP-C   Brief Narrative:  89 year old with history of previous CVA with left-sided hemiparesis and dysphagia, chronic diastolic CHF EF [id number] with grade 2 DD, chronic nocturnal hypoxia, recent UTI on antibiotics, laryngeal reflux disease, chronic otitis media with effusion status post ear tube placement, physical deconditioning, depression/anxiety, mobility impairment, sent from PCP office for lethargy.  Prior to admission went to urgent care on 2/3 and found to have Proteus UTI treated with Keflex and Levaquin.  In the ED was noted to have acute CHF requiring IV Lasix and patient was admitted.  During hospitalization patient was noted to have recurrent aspiration requiring antibiotic treatment.  Also was diagnosed with Enterococcus urinary tract infection initially required linezolid thereafter 1 dose of fosfomycin.  During this time patient has been noted to be incompetent therefore court has assigned a guardianship.  Case has been filed against previous caretaker for APS. DDS DNR form was signed.  Official confirmation received from patient's guardian with signed and notarized paperwork, placed in chart.  CODE STATUS changed to DNR.  Assessment & Plan:  Principal Problem:   Acute metabolic encephalopathy Active Problems:   Acute respiratory failure with hypoxia (HCC)   Complicated UTI (urinary tract infection)   Aspiration pneumonia of both lower lobes due to gastric secretions (HCC)   Dysphagia   Acute on chronic diastolic CHF (congestive heart failure) (HCC)   Type 2 diabetes mellitus with stage 3b chronic kidney disease, without long-term current use of insulin (HCC)   Closed fracture of two ribs, right, initial encounter   Chronic kidney disease, stage 3b ([place])   Essential hypertension   GERD without esophagitis   Hyperkalemia   Hemiplegia, late effect of cerebrovascular disease (HCC)    History of CVA (cerebrovascular accident)   CKD (chronic kidney disease) stage 4, GFR 15-29 ml/min (HCC)   Asthma, chronic, unspecified asthma severity, with acute exacerbation   Obesity (BMI 30-39.9)   Chronic respiratory failure with hypoxia (HCC)     Assessment and Plan: * Acute metabolic encephalopathy - Mentation fluctuating, no focal neuro signs.  Combination of aspiration pneumonia, UTI and hypoglycemia.  Completed 7 days of Unasyn - MRI brain 2/11-negative.  TSH, VBG, ammonia, B12 and UDS negative.  EEG unremarkable  Complicated UTI (urinary tract infection) -Urine culture 2/3-Proteus, 2/7-VRE Enterococcus faecium. - On Unasyn, received cefepime/Flagyl then transition to linezolid hide thereafter 1 dose of fosfomycin 2/13  Acute respiratory failure with hypoxia ([place]), from aspiration pneumonia -As needed bronchodilators.  Also had received intermittent Lasix but now clinically dehydrated therefore getting IV fluids  Aspiration pneumonia of both lower lobes due to gastric secretions (HCC) Dysphagia -Unfortunately patient has had multiple witnessed episodes of aspiration.  I anticipate this to be a recurrent issue.  Confirmed by speech and swallow.  MBS performed.  Dysphagia 1 diet.  Cultures remain negative.  Acute on chronic diastolic CHF (congestive heart failure) (HCC) - Overall slightly clinically dehydrated with poor oral intake now.  Echocardiogram 2023 showed EF of 65%, grade 2 DD.  Intermittently receiving IV Lasix.  Acute on chronic anemia - Baseline hemoglobin 8.5, this morning 9.3.  No obvious signs of bleeding.  All cell lines have trended down, suspect dilutional  Type 2 diabetes mellitus with stage 3b chronic kidney disease, without long-term current use of insulin (HCC) Recurrent hypoglycemic episodes - A1c 9.7.  Very poor oral intake with risk of hypoglycemia and therefore  all the insulin discontinued.  Discussed with family, okay with hyperglycemia to avoid  symptomatic and more life-threatening hypoglycemic episodes.  Closed fracture of two ribs, right, initial encounter, right 10th and 11th rib -No other obvious physical trauma noted.  Pain control.  Vitamin D normal 1 month ago  Chronic kidney disease, stage 3b (HCC) -Creatinine remains around baseline of 1.2.   Essential hypertension, Uncontrolled, labile -Currently on Norvasc, Coreg, lisinopril.  Increase hydralazine 50 mg 3 times daily. Getting IV as needed  GERD without esophagitis -PPI  After my extensive discussion with patient's grandnephew and his wife on 2/15 and [name] on 2/16.  My recommendations would be transition patient to hospice/comfort care given her extensive medical history, risk of recurrent aspiration and malnourishment with poor oral intake.  Most of these patients condition are not reversible and we should really shift our focus to comfort.  Patient's family and [name] is in agreement but at this time we will need official approval from guardianship administration.   PT/OT-SNF.  At this time goals would be to pursue hospice but will need official confirmation from legal guardianship office  DVT prophylaxis: Lovenox Code Status: Patient is now full DNR, DDS DNR paperwork signed and notarized Family Communication: Called [name] on 2/16, met with family at bedside on 2/17  Status is: Inpatient Continue hospital stay    Subjective: Seen and examined at bedside, this morning she answers very basic questions for me.  Overall feels tired Poor oral intake with intermittent signs of gross aspirations  Examination: Constitutional: Not in acute distress Respiratory: Clear to auscultation bilaterally Cardiovascular: Normal sinus rhythm, no rubs Abdomen: Nontender nondistended good bowel sounds Musculoskeletal: No edema noted Skin: No rashes seen Neurologic: CN 2-12 grossly intact.  And nonfocal Psychiatric: Poor job manage insight.  Alert to  name and place this morning Objective: Vitals:   10/07/22 0400 10/07/22 0514 10/07/22 0600 10/07/22 0646  BP: (!) 228/76 (!) 178/68 (!) 194/82 (!) 200/112  Pulse: 69  92   Resp: 19  15   Temp:      TempSrc:      SpO2: 100%  98%   Weight:      Height:        Intake/Output Summary (Last 24 hours) at 10/07/2022 0729 Last data filed at 10/06/2022 2200 Gross per 24 hour  Intake 1881.9 ml  Output 200 ml  Net 1681.9 ml   Filed Weights   10/04/22 0500 10/05/22 0334 10/06/22 0500  Weight: 73.5 kg 73.4 kg 73.2 kg     Data Reviewed:   CBC: Recent Labs  Lab 10/01/22 0424 10/02/22 0503 10/03/22 0252 10/04/22 0256 10/05/22 0321 10/06/22 0259 10/07/22 0257  WBC 8.7 5.9 6.4 5.1 5.5 3.8* 3.9*  NEUTROABS 6.6 3.8 4.8  --   --   --   --   HGB 8.7* 8.5* 9.5* 8.3* 9.1* 7.7* 9.3*  HCT 28.9* 29.0* 32.4* 28.1* 30.6* 25.8* 32.2*  MCV 92.6 92.4 92.8 92.1 90.5 91.5 92.5  PLT 197 208 275 231 251 186 [id number]   Basic Metabolic Panel: Recent Labs  Lab 10/03/22 0252 10/04/22 0256 10/05/22 0321 10/06/22 0259 10/07/22 0257  NA 136 137 138 132* 135  K 3.6 3.6 3.5 3.5 3.9  CL 99 100 101 101 100  CO2 27 29 27 28 27  $ GLUCOSE 110* 232* 256* 275* 251*  BUN 21 15 13 13 10  $ CREATININE 1.25* 1.05* 1.05* 0.96 1.02*  CALCIUM 8.1* 8.0* 8.3* 7.7* 8.2*  MG 1.8 1.7 1.6* 2.1 2.0   GFR: Estimated Creatinine Clearance: 34.4 mL/min (A) (by C-G formula based on SCr of 1.02 mg/dL (H)). Liver Function Tests: Recent Labs  Lab 10/01/22 0424 10/02/22 0503 10/03/22 0252  AST 33 31 38  ALT 19 19 25  $ ALKPHOS 170* 160* 186*  BILITOT 0.6 0.4 0.5  PROT 5.1* 4.8* 5.8*  ALBUMIN 2.7* 2.4* 3.0*   No results for input(s): "LIPASE", "AMYLASE" in the last 168 hours.  No results for input(s): "AMMONIA" in the last 168 hours.  Coagulation Profile: No results for input(s): "INR", "PROTIME" in the last 168 hours. Cardiac Enzymes: No results for input(s): "CKTOTAL", "CKMB", "CKMBINDEX", "TROPONINI" in the last 168  hours. BNP (last 3 results) Recent Labs    07/04/22 1140 08/24/22 1426  PROBNP 161.0* 298.0*   HbA1C: No results for input(s): "HGBA1C" in the last 72 hours. CBG: Recent Labs  Lab 10/06/22 0814 10/06/22 1206 10/06/22 1606 10/06/22 2035 10/07/22 0026  GLUCAP 243* 260* 261* 261* 255*   Lipid Profile: No results for input(s): "CHOL", "HDL", "LDLCALC", "TRIG", "CHOLHDL", "LDLDIRECT" in the last 72 hours. Thyroid Function Tests: No results for input(s): "TSH", "T4TOTAL", "FREET4", "T3FREE", "THYROIDAB" in the last 72 hours. Anemia Panel: No results for input(s): "VITAMINB12", "FOLATE", "FERRITIN", "TIBC", "IRON", "RETICCTPCT" in the last 72 hours. Sepsis Labs: Recent Labs  Lab 10/02/22 2047 10/02/22 2200  LATICACIDVEN 0.7 0.9    Recent Results (from the past 240 hour(s))  MRSA Next Gen by PCR, Nasal     Status: None   Collection Time: 10/02/22  5:24 PM   Specimen: Nasal Mucosa; Nasal Swab  Result Value Ref Range Status   MRSA by PCR Next Gen NOT DETECTED NOT DETECTED Final    Comment: (NOTE) The GeneXpert MRSA Assay (FDA approved for NASAL specimens only), is one component of a comprehensive MRSA colonization surveillance program. It is not intended to diagnose MRSA infection nor to guide or monitor treatment for MRSA infections. Test performance is not FDA approved in patients less than 36 years old. Performed at [facility], [place] [address] [place], [place] [address]   Culture, blood (Routine X 2) w Reflex to ID Panel     Status: None (Preliminary result)   Collection Time: 10/02/22  8:47 PM   Specimen: BLOOD  Result Value Ref Range Status   Specimen Description   Final    BLOOD RIGHT ANTECUBITAL Performed at [place] [address] [place], [place] [address]    Special Requests   Final    BOTTLES DRAWN AEROBIC ONLY Blood Culture adequate volume Performed at [place] [address]  [place], [place] [address]    Culture   Final    NO GROWTH 4 DAYS Performed at [facility], [place] [address], [place], [place] [address]    Report Status PENDING  Incomplete  Culture, blood (Routine X 2) w Reflex to ID Panel     Status: None (Preliminary result)   Collection Time: 10/02/22  8:47 PM   Specimen: BLOOD  Result Value Ref Range Status   Specimen Description   Final    BLOOD BLOOD LEFT HAND Performed at [place] [address], [place], [place] [address]    Special Requests   Final    BOTTLES DRAWN AEROBIC ONLY Blood Culture results may not be optimal due to an inadequate volume of blood received in culture bottles Performed at [facility],  [place] [address] [place], [place] [address]    Culture   Final    NO GROWTH 4 DAYS Performed at [facility], [place] [address] [place], [place] [address]    Report Status PENDING  Incomplete         Radiology Studies: No results found.      Scheduled Meds:  amLODipine  10 mg Oral Daily   aspirin  325 mg Oral Daily   carvedilol  3.125 mg Oral BID WC   Chlorhexidine Gluconate Cloth  6 each Topical Q0600   cholecalciferol  1,000 Units Oral Daily   cyanocobalamin  1,000 mcg Oral Daily   docusate sodium  200 mg Oral QHS   enoxaparin (LOVENOX) injection  40 mg Subcutaneous Q24H   famotidine  20 mg Oral BID   feeding supplement (GLUCERNA SHAKE)  237 mL Oral TID BM   fluticasone  2 spray Each Nare QHS   hydrALAZINE  25 mg Oral Q8H   lisinopril  20 mg Oral QHS   melatonin  10 mg Oral QHS   mirtazapine  15 mg Oral QHS   multivitamin with minerals  1 tablet Oral Daily   mouth rinse  15 mL Mouth Rinse 4 times per day   sertraline  100 mg Oral Daily   Continuous Infusions:     LOS: 11 days   Time spent= 35 mins    Jarome Trull Arsenio Loader, MD [facility]  If 7PM-7AM, please contact night-coverage  10/07/2022, 7:29 AM

## 2022-10-07 NOTE — Progress Notes (Signed)
Pt transferred to 5W. Report called. Pt stable at transfer

## 2022-10-07 NOTE — Progress Notes (Signed)
PT NOTE/ d/c PT  Plan is for Hospice/comfort measures at this time. PT signing off.   Baxter Flattery, PT  Acute Rehab Dept [facility]) [phone]  [facility] Weekend Pager [facility] only)  [phone]  10/07/2022

## 2022-10-08 LAB — BASIC METABOLIC PANEL
Anion gap: 8 (ref 5–15)
BUN: 11 mg/dL (ref 8–23)
CO2: 27 mmol/L (ref 22–32)
Calcium: 8.5 mg/dL — ABNORMAL LOW (ref 8.9–10.3)
Chloride: 100 mmol/L (ref 98–111)
Creatinine, Ser: 1 mg/dL (ref 0.44–1.00)
GFR, Estimated: 53 mL/min — ABNORMAL LOW (ref >60)
Glucose, Bld: 196 mg/dL — ABNORMAL HIGH (ref 70–99)
Potassium: 4 mmol/L (ref 3.5–5.1)
Sodium: 135 mmol/L (ref 135–145)

## 2022-10-08 LAB — GLUCOSE, CAPILLARY
Glucose-Capillary: 205 mg/dL — ABNORMAL HIGH (ref 70–99)
Glucose-Capillary: 182 mg/dL — ABNORMAL HIGH (ref 70–99)
Glucose-Capillary: 182 mg/dL — ABNORMAL HIGH (ref 70–99)
Glucose-Capillary: 236 mg/dL — ABNORMAL HIGH (ref 70–99)
Glucose-Capillary: 223 mg/dL — ABNORMAL HIGH (ref 70–99)
Glucose-Capillary: 211 mg/dL — ABNORMAL HIGH (ref 70–99)

## 2022-10-08 LAB — CBC
HCT: 30 % — ABNORMAL LOW (ref 36.0–46.0)
Hemoglobin: 9 g/dL — ABNORMAL LOW (ref 12.0–15.0)
MCH: 27.5 pg (ref 26.0–34.0)
MCHC: 30 g/dL (ref 30.0–36.0)
MCV: 91.7 fL (ref 80.0–100.0)
Platelets: 177 10*3/uL (ref 150–400)
RBC: 3.27 MIL/uL — ABNORMAL LOW (ref 3.87–5.11)
RDW: 14 % (ref 11.5–15.5)
WBC: 3.9 10*3/uL — ABNORMAL LOW (ref 4.0–10.5)
nRBC: 0 % (ref 0.0–0.2)

## 2022-10-08 LAB — MAGNESIUM: Magnesium: 1.9 mg/dL (ref 1.7–2.4)

## 2022-10-08 MED ORDER — MELATONIN 10 MG PO TABS: 10.0000 mg | ORAL_TABLET | Freq: Every day | ORAL | 0 refills | Status: DC

## 2022-10-08 MED ORDER — CARVEDILOL 3.125 MG PO TABS: 3.1250 mg | ORAL_TABLET | Freq: Two times a day (BID) | ORAL | Status: DC

## 2022-10-08 MED ORDER — LISINOPRIL 10 MG PO TABS: 40.0000 mg | ORAL_TABLET | Freq: Every day | ORAL | Status: DC

## 2022-10-08 MED ORDER — HYDRALAZINE HCL 25 MG PO TABS: 75.0000 mg | ORAL_TABLET | Freq: Three times a day (TID) | ORAL | Status: DC

## 2022-10-08 MED ORDER — HYDRALAZINE HCL 50 MG PO TABS
75.0000 mg | ORAL_TABLET | Freq: Three times a day (TID) | ORAL | Status: DC
  Administered 2022-10-08: 75 mg via ORAL
  Filled 2022-10-08: qty 2

## 2022-10-08 NOTE — Progress Notes (Addendum)
PROGRESS NOTE    Renee [name]  [id number] DOB: 23-Dec-1928 DOA: 09/26/2022 PCP: Girtha Rm, NP-C   Brief Narrative:  89 year old with history of previous CVA with left-sided hemiparesis and dysphagia, chronic diastolic CHF EF [id number] with grade 2 DD, chronic nocturnal hypoxia, recent UTI on antibiotics, laryngeal reflux disease, chronic otitis media with effusion status post ear tube placement, physical deconditioning, depression/anxiety, mobility impairment, sent from PCP office for lethargy.  Prior to admission went to urgent care on 2/3 and found to have Proteus UTI treated with Keflex and Levaquin.  In the ED was noted to have acute CHF requiring IV Lasix and patient was admitted.  During hospitalization patient was noted to have recurrent aspiration requiring antibiotic treatment.  Also was diagnosed with Enterococcus urinary tract infection initially required linezolid thereafter 1 dose of fosfomycin.  During this time patient has been noted to be incompetent therefore court has assigned a guardianship.  Case has been filed against previous caretaker for APS. DDS DNR form was signed.  Official confirmation received from patient's guardian with signed and notarized paperwork, placed in chart.  CODE STATUS changed to DNR..  I discussed with patient's legal guardian, Renee [name] who has approved the patient should be comfort care with hospice.  Assessment & Plan:  Principal Problem:   Acute metabolic encephalopathy Active Problems:   Acute respiratory failure with hypoxia (HCC)   Complicated UTI (urinary tract infection)   Aspiration pneumonia of both lower lobes due to gastric secretions (HCC)   Dysphagia   Acute on chronic diastolic CHF (congestive heart failure) (HCC)   Type 2 diabetes mellitus with stage 3b chronic kidney disease, without long-term current use of insulin (HCC)   Closed fracture of two ribs, right, initial encounter   Chronic kidney disease, stage 3b ([place])    Essential hypertension   GERD without esophagitis   Hyperkalemia   Hemiplegia, late effect of cerebrovascular disease (HCC)   History of CVA (cerebrovascular accident)   CKD (chronic kidney disease) stage 4, GFR 15-29 ml/min (HCC)   Asthma, chronic, unspecified asthma severity, with acute exacerbation   Obesity (BMI 30-39.9)   Chronic respiratory failure with hypoxia (HCC)   Assessment and Plan: * Acute metabolic encephalopathy - Mentation fluctuating, no focal neuro signs.  Combination of aspiration pneumonia, UTI and hypoglycemia.  Completed 7 days of Unasyn - MRI brain 2/11-negative.  TSH, VBG, ammonia, B12 and UDS negative.  EEG unremarkable  Complicated UTI (urinary tract infection) -Urine culture 2/3-Proteus, 2/7-VRE Enterococcus faecium. - On Unasyn, received cefepime/Flagyl then transition to linezolid hide thereafter 1 dose of fosfomycin 2/13  Acute respiratory failure with hypoxia ([place]), from aspiration pneumonia -As needed bronchodilators.  Also had received intermittent Lasix but now clinically dehydrated therefore getting IV fluids  Aspiration pneumonia of both lower lobes due to gastric secretions (HCC) Dysphagia -Unfortunately patient has had multiple witnessed episodes of aspiration.  I anticipate this to be a recurrent issue.  Confirmed by speech and swallow.  MBS performed.  Dysphagia 1 diet.  Cultures remain negative.  Acute on chronic diastolic CHF (congestive heart failure) (HCC) - Overall slightly clinically dehydrated with poor oral intake now.  Echocardiogram 2023 showed EF of 65%, grade 2 DD.  Intermittently receiving IV Lasix.  Acute on chronic anemia - Baseline hemoglobin 8.5, this morning 9.3.  No obvious signs of bleeding.  All cell lines have trended down, suspect dilutional  Type 2 diabetes mellitus with stage 3b chronic kidney disease, without long-term current use of insulin (  [place]) Recurrent hypoglycemic episodes - A1c 9.7.  Very poor oral intake with  risk of hypoglycemia and therefore all the insulin discontinued.  Discussed with family, okay with hyperglycemia to avoid symptomatic and more life-threatening hypoglycemic episodes.  Closed fracture of two ribs, right, initial encounter, right 10th and 11th rib -No other obvious physical trauma noted.  Pain control.  Vitamin D normal 1 month ago  Chronic kidney disease, stage 3b (HCC) -Creatinine remains around baseline of 1.2.  Essential hypertension, Uncontrolled, labile -Currently on Norvasc, Coreg, increase hydralazine and lisinopril. Getting IV as needed  GERD without esophagitis -PPI  After my discussion with Renee [name] on 2/19, she has improved that patient should be comfort care with hospice/DNR/DNI.  Will consult TOC for referral. Would benefit from inpatient hospice, as patient will continued escalation of care due to recurrent risk of aspiration. Worsening mentation, risk of hypoglycemia and symptoms management.   DVT prophylaxis: Lovenox Code Status: Patient is now full DNR, DDS DNR paperwork signed and notarized. Patient will now be Hospice/Comfort Care.  Family Communication:   Status is: Inpatient Continue hospital stay until we have safe disposition for her    Subjective: Seen and examined at bedside.  Patient answering basic questions.  She agrees and understands that she will be hospice/comfort care.  We can continue current medications but no escalation in her care. Continued concern is for recurrent aspiration and very poor oral intake.  Examination: Constitutional: Not in acute distress Respiratory: Clear to auscultation bilaterally Cardiovascular: Normal sinus rhythm, no rubs Abdomen: Nontender nondistended good bowel sounds Musculoskeletal: No edema noted Skin: No rashes seen Neurologic: CN 2-12 grossly intact.  And nonfocal Psychiatric: Elderly frail.  Alert to name place and date Objective: Vitals:   10/07/22 1200 10/07/22 1530 10/07/22 2031  10/08/22 0721  BP:  (!) 165/64 (!) 187/72 (!) 180/99  Pulse: 68 65 70   Resp: 16 16 20   $ Temp:  98.1 F (36.7 C) 98.6 F (37 C)   TempSrc:  Axillary Oral   SpO2: 100% 100% 97%   Weight:      Height:        Intake/Output Summary (Last 24 hours) at 10/08/2022 0847 Last data filed at 10/07/2022 1744 Gross per 24 hour  Intake 140 ml  Output 375 ml  Net -235 ml   Filed Weights   10/04/22 0500 10/05/22 0334 10/06/22 0500  Weight: 73.5 kg 73.4 kg 73.2 kg     Data Reviewed:   CBC: Recent Labs  Lab 10/02/22 0503 10/03/22 0252 10/04/22 0256 10/05/22 0321 10/06/22 0259 10/07/22 0257 10/08/22 0348  WBC 5.9 6.4 5.1 5.5 3.8* 3.9* 3.9*  NEUTROABS 3.8 4.8  --   --   --   --   --   HGB 8.5* 9.5* 8.3* 9.1* 7.7* 9.3* 9.0*  HCT 29.0* 32.4* 28.1* 30.6* 25.8* 32.2* 30.0*  MCV 92.4 92.8 92.1 90.5 91.5 92.5 91.7  PLT 208 275 231 251 186 199 [id number]   Basic Metabolic Panel: Recent Labs  Lab 10/04/22 0256 10/05/22 0321 10/06/22 0259 10/07/22 0257 10/08/22 0348  NA 137 138 132* 135 135  K 3.6 3.5 3.5 3.9 4.0  CL 100 101 101 100 100  CO2 29 27 28 27 27  $ GLUCOSE 232* 256* 275* 251* 196*  BUN 15 13 13 10 11  $ CREATININE 1.05* 1.05* 0.96 1.02* 1.00  CALCIUM 8.0* 8.3* 7.7* 8.2* 8.5*  MG 1.7 1.6* 2.1 2.0 1.9   GFR: Estimated Creatinine Clearance: 35.1 mL/min (by  C-G formula based on SCr of 1 mg/dL). Liver Function Tests: Recent Labs  Lab 10/02/22 0503 10/03/22 0252  AST 31 38  ALT 19 25  ALKPHOS 160* 186*  BILITOT 0.4 0.5  PROT 4.8* 5.8*  ALBUMIN 2.4* 3.0*   No results for input(s): "LIPASE", "AMYLASE" in the last 168 hours.  No results for input(s): "AMMONIA" in the last 168 hours.  Coagulation Profile: No results for input(s): "INR", "PROTIME" in the last 168 hours. Cardiac Enzymes: No results for input(s): "CKTOTAL", "CKMB", "CKMBINDEX", "TROPONINI" in the last 168 hours. BNP (last 3 results) Recent Labs    07/04/22 1140 08/24/22 1426  PROBNP 161.0* 298.0*    HbA1C: No results for input(s): "HGBA1C" in the last 72 hours. CBG: Recent Labs  Lab 10/07/22 1551 10/07/22 2029 10/08/22 0100 10/08/22 0412 10/08/22 0727  GLUCAP 235* 230* 205* 182* 182*   Lipid Profile: No results for input(s): "CHOL", "HDL", "LDLCALC", "TRIG", "CHOLHDL", "LDLDIRECT" in the last 72 hours. Thyroid Function Tests: No results for input(s): "TSH", "T4TOTAL", "FREET4", "T3FREE", "THYROIDAB" in the last 72 hours. Anemia Panel: No results for input(s): "VITAMINB12", "FOLATE", "FERRITIN", "TIBC", "IRON", "RETICCTPCT" in the last 72 hours. Sepsis Labs: Recent Labs  Lab 10/02/22 2047 10/02/22 2200  LATICACIDVEN 0.7 0.9    Recent Results (from the past 240 hour(s))  MRSA Next Gen by PCR, Nasal     Status: None   Collection Time: 10/02/22  5:24 PM   Specimen: Nasal Mucosa; Nasal Swab  Result Value Ref Range Status   MRSA by PCR Next Gen NOT DETECTED NOT DETECTED Final    Comment: (NOTE) The GeneXpert MRSA Assay (FDA approved for NASAL specimens only), is one component of a comprehensive MRSA colonization surveillance program. It is not intended to diagnose MRSA infection nor to guide or monitor treatment for MRSA infections. Test performance is not FDA approved in patients less than 20 years old. Performed at [facility], [place] [address] [place], [place] [address]   Culture, blood (Routine X 2) w Reflex to ID Panel     Status: None   Collection Time: 10/02/22  8:47 PM   Specimen: BLOOD  Result Value Ref Range Status   Specimen Description   Final    BLOOD RIGHT ANTECUBITAL Performed at [place] [address], [place], [place] [address]    Special Requests   Final    BOTTLES DRAWN AEROBIC ONLY Blood Culture adequate volume Performed at [place] [address], [place], [place] [address]    Culture   Final    NO GROWTH 5 DAYS Performed at [facility], [place] [address]  [place], [place] [address]    Report Status 10/07/2022 FINAL  Final  Culture, blood (Routine X 2) w Reflex to ID Panel     Status: None   Collection Time: 10/02/22  8:47 PM   Specimen: BLOOD  Result Value Ref Range Status   Specimen Description   Final    BLOOD BLOOD LEFT HAND Performed at [place] [address] [place], [place] [address]    Special Requests   Final    BOTTLES DRAWN AEROBIC ONLY Blood Culture results may not be optimal due to an inadequate volume of blood received in culture bottles Performed at [place] [address] [place], [place] [address]    Culture   Final    NO GROWTH 5 DAYS Performed at [facility], [address] [place], [place] [address]    Report Status 10/07/2022 FINAL  Final         Radiology Studies: No results found.      Scheduled Meds:  amLODipine  10 mg Oral Daily   aspirin  325 mg Oral Daily   carvedilol  3.125 mg Oral BID WC   Chlorhexidine Gluconate Cloth  6 each Topical Q0600   cholecalciferol  1,000 Units Oral Daily   cyanocobalamin  1,000 mcg Oral Daily   docusate sodium  200 mg Oral QHS   enoxaparin (LOVENOX) injection  40 mg Subcutaneous Q24H   famotidine  20 mg Oral BID   feeding supplement (GLUCERNA SHAKE)  237 mL Oral TID BM   fluticasone  2 spray Each Nare QHS   hydrALAZINE  75 mg Oral Q8H   lisinopril  40 mg Oral QHS   melatonin  10 mg Oral QHS   mirtazapine  15 mg Oral QHS   multivitamin with minerals  1 tablet Oral Daily   mouth rinse  15 mL Mouth Rinse 4 times per day   sertraline  100 mg Oral Daily   Continuous Infusions:     LOS: 12 days   Time spent= 35 mins    Maikol Grassia Arsenio Loader, MD [facility]  If 7PM-7AM, please contact night-coverage  10/08/2022, 8:47 AM

## 2022-10-08 NOTE — Discharge Summary (Signed)
Physician Discharge Summary  Renee [name] [id number] DOB: 01-17-1929 DOA: 09/26/2022  PCP: Girtha Rm, NP-C  Admit date: 09/26/2022 Discharge date: 10/08/2022  Admitted From: Home Disposition:  [facility].   Recommendations for Outpatient Follow-up:  Hospice facility.    CODE STATUS: DNR Diet recommendation: Comfort as tolerated.   Brief/Interim Summary: 89 year old with history of previous CVA with left-sided hemiparesis and dysphagia, chronic diastolic CHF EF [id number] with grade 2 DD, chronic nocturnal hypoxia, recent UTI on antibiotics, laryngeal reflux disease, chronic otitis media with effusion status post ear tube placement, physical deconditioning, depression/anxiety, mobility impairment, sent from PCP office for lethargy.  Prior to admission went to urgent care on 2/3 and found to have Proteus UTI treated with Keflex and Levaquin.  In the ED was noted to have acute CHF requiring IV Lasix and patient was admitted.  During hospitalization patient was noted to have recurrent aspiration requiring antibiotic treatment.  Also was diagnosed with Enterococcus urinary tract infection initially required linezolid thereafter 1 dose of fosfomycin.  During this time patient has been noted to be incompetent therefore court has assigned a guardianship.  Case has been filed against previous caretaker for APS. DDS DNR form was signed.  Official confirmation received from patient's guardian with signed and notarized paperwork, placed in chart.  CODE STATUS changed to DNR..  I discussed with patient's legal guardian, Renee [name] who has approved the patient should be comfort care with hospice.    Assessment and Plan:  Acute metabolic encephalopathy - Mentation fluctuating, no focal neuro signs.  Combination of aspiration pneumonia, UTI and hypoglycemia.  Completed 7 days of Unasyn - MRI brain 2/11-negative.  TSH, VBG, ammonia, B12 and UDS negative.  EEG unremarkable   Complicated UTI (urinary  tract infection) -Urine culture 2/3-Proteus, 2/7-VRE Enterococcus faecium. - On Unasyn, received cefepime/Flagyl then transition to linezolid hide thereafter 1 dose of fosfomycin 2/13   Acute respiratory failure with hypoxia ([place]), from aspiration pneumonia -As needed bronchodilators.  Now stable   Aspiration pneumonia of both lower lobes due to gastric secretions ([place]) Dysphagia -Unfortunately patient has had multiple witnessed episodes of aspiration.  I anticipate this to be a recurrent issue.  Confirmed by speech and swallow.  MBS performed.  Dysphagia 1 diet.  Cultures remain negative. But should proceed with comfort feed.    Acute on chronic diastolic CHF (congestive heart failure) (HCC) - Overall slightly clinically dehydrated with poor oral intake now.  Echocardiogram 2023 showed EF of 65%, grade 2 DD. Now euvolemic.    Acute on chronic anemia - Baseline hemoglobin 8.5, this morning 9.3.  No obvious signs of bleeding.     Type 2 diabetes mellitus with stage 3b chronic kidney disease, without long-term current use of insulin (HCC) Recurrent hypoglycemic episodes - A1c 9.7.  Very poor oral intake with risk of hypoglycemia and therefore all the insulin discontinued.  Discussed with family and guardian, okay with hyperglycemia to avoid symptomatic and more life-threatening hypoglycemic episodes. Stop diabetic meds.    Closed fracture of two ribs, right, initial encounter, right 10th and 11th rib -No other obvious physical trauma noted.  Pain control.  Vitamin D normal 1 month ago   Chronic kidney disease, stage 3b (HCC) -Creatinine remains around baseline of 1.2.   Essential hypertension, Uncontrolled, labile -cont meds as long as she is able to tolerate po   GERD without esophagitis -PPI     Discharge Diagnoses:  Principal Problem:   Acute metabolic encephalopathy Active Problems:  Acute respiratory failure with hypoxia (HCC)   Complicated UTI (urinary tract infection)    Aspiration pneumonia of both lower lobes due to gastric secretions (HCC)   Dysphagia   Acute on chronic diastolic CHF (congestive heart failure) ([place])   Type 2 diabetes mellitus with stage 3b chronic kidney disease, without long-term current use of insulin (HCC)   Closed fracture of two ribs, right, initial encounter   Chronic kidney disease, stage 3b ([place])   Essential hypertension   GERD without esophagitis   Hyperkalemia   Hemiplegia, late effect of cerebrovascular disease (HCC)   History of CVA (cerebrovascular accident)   CKD (chronic kidney disease) stage 4, GFR 15-29 ml/min (HCC)   Asthma, chronic, unspecified asthma severity, with acute exacerbation   Obesity (BMI 30-39.9)   Chronic respiratory failure with hypoxia (HCC)     Subjective: No complaints.   Discharge Exam: Vitals:   10/08/22 0721 10/08/22 1256  BP: (!) 180/99 (!) 136/59  Pulse:  (!) 55  Resp:  18  Temp:  97.8 F (36.6 C)  SpO2:  100%   Vitals:   10/07/22 1530 10/07/22 2031 10/08/22 0721 10/08/22 1256  BP: (!) 165/64 (!) 187/72 (!) 180/99 (!) 136/59  Pulse: 65 70  (!) 55  Resp: 16 20  18  $ Temp: 98.1 F (36.7 C) 98.6 F (37 C)  97.8 F (36.6 C)  TempSrc: Axillary Oral  Oral  SpO2: 100% 97%  100%  Weight:      Height:       Discharge Instructions   Allergies as of 10/08/2022       Reactions   Tape Other (See Comments)   The patient's skin is VERY THIN- will TEAR EASILY!!   Voltaren [diclofenac] Swelling, Other (See Comments)   Angioedema        Medication List     STOP taking these medications    cephALEXin 500 MG capsule Commonly known as: KEFLEX   furosemide 20 MG tablet Commonly known as: LASIX   glipiZIDE 2.5 MG 24 hr tablet Commonly known as: GLUCOTROL XL   levofloxacin 250 MG tablet Commonly known as: Levaquin   metoprolol succinate 25 MG 24 hr tablet Commonly known as: TOPROL-XL   omeprazole 20 MG capsule Commonly known as: PRILOSEC   pioglitazone 30 MG  tablet Commonly known as: Actos   sitaGLIPtin 100 MG tablet Commonly known as: Januvia   trandolapril 4 MG tablet Commonly known as: MAVIK       TAKE these medications    albuterol (2.5 MG/3ML) 0.083% nebulizer solution Commonly known as: PROVENTIL Take 3 mLs (2.5 mg total) by nebulization every 6 (six) hours as needed for wheezing or shortness of breath.   albuterol 108 (90 Base) MCG/ACT inhaler Commonly known as: VENTOLIN HFA INHALE 2 PUFFS INTO THE LUNGS IN THE MORNING, AT NOON, AND AT BEDTIME.   amLODipine 10 MG tablet Commonly known as: NORVASC TAKE 1 TABLET BY MOUTH EVERY DAY   aspirin EC 325 MG tablet Take 325 mg by mouth in the morning.   budesonide-formoterol 160-4.5 MCG/ACT inhaler Commonly known as: Symbicort Inhale 2 puffs into the lungs 2 (two) times daily.   calcium-vitamin D 500-200 MG-UNIT tablet Commonly known as: OSCAL WITH D Take 1 tablet by mouth daily.   carvedilol 3.125 MG tablet Commonly known as: COREG Take 1 tablet (3.125 mg total) by mouth 2 (two) times daily with a meal.   Cranberry 500 MG Tabs Take 500 mg by mouth in the morning and  at bedtime.   cyanocobalamin 1000 MCG tablet Commonly known as: VITAMIN B12 Take 1,000 mcg by mouth daily.   docusate sodium 100 MG capsule Commonly known as: COLACE Take 100-200 mg by mouth See admin instructions. Taking 100 mg in the AM and 200 mg at bedtime   estradiol 0.1 MG/GM vaginal cream Commonly known as: ESTRACE Place 1 g vaginally at bedtime.   Flonase 50 MCG/ACT nasal spray Generic drug: fluticasone Place 2 sprays into both nostrils See admin instructions. Instill 2 sprays into each nostril at bedtime   hydrALAZINE 25 MG tablet Commonly known as: APRESOLINE Take 3 tablets (75 mg total) by mouth every 8 (eight) hours. What changed: how much to take   Melatonin 10 MG Tabs Take 10 mg by mouth at bedtime.   mirtazapine 15 MG tablet Commonly known as: REMERON TAKE 1 TABLET BY MOUTH  EVERYDAY AT BEDTIME What changed: See the new instructions.   multivitamin capsule Take 1 capsule by mouth daily.   OneTouch Delica Plus 123456 Misc CHECK BLOOD SUGAR ONCE DAILY AND AS DIRECTED   OneTouch Ultra test strip Generic drug: glucose blood CHECK BLOOD SUGAR ONCE DAILY AND AS DIRECTED.   pantoprazole 40 MG tablet Commonly known as: PROTONIX Take 1 tablet (40 mg total) by mouth daily at 6 (six) AM.   PATADAY OP Place 1 drop into both eyes 2 (two) times daily as needed (for itching or redness).   PreserVision AREDS 2+Multi Vit Caps Take 1 capsule by mouth in the morning and at bedtime.   sertraline 100 MG tablet Commonly known as: ZOLOFT TAKE 1 TABLET BY MOUTH EVERY DAY   solifenacin 10 MG tablet Commonly known as: VESICARE TAKE 1 TABLET BY MOUTH EVERY DAY   Spacer/Aero-Holding Dorise Bullion 1 each by Does not apply route at bedtime. Use with inhaler.   Vitamin D-3 25 MCG (1000 UT) Caps Take 1,000 Units by mouth in the morning.        Follow-up Information     [facility] [place] Follow up.   Specialty: [facility] Contact information: [place] [address] [phone]         [facility] [place] Follow up.   Contact information: [address] [place] [id number] [phone]                Allergies  Allergen Reactions   Tape Other (See Comments)    The patient's skin is VERY THIN- will TEAR EASILY!!   Voltaren [Diclofenac] Swelling and Other (See Comments)    Angioedema    You were cared for by a hospitalist during your hospital stay. If you have any questions about your discharge medications or the care you received while you were in the hospital after you are discharged, you can call the unit and asked to speak with the hospitalist on call if the hospitalist that took care of you is not available. Once you are discharged, your primary care physician will handle any further medical  issues. Please note that no refills for any discharge medications will be authorized once you are discharged, as it is imperative that you return to your primary care physician (or establish a relationship with a primary care physician if you do not have one) for your aftercare needs so that they can reassess your need for medications and monitor your lab values.   Procedures/Studies: DG Swallowing Func-Speech Pathology  Result Date: 10/01/2022 Table formatting from the original result was not  included. Modified Barium Swallow Study Patient Details Name: Renee [name] MRN: [id number] Date of Birth: 1929-08-13 Today's Date: 10/01/2022 HPI/PMH: HPI: Renee [name] is a 89 y.o. female with medical history significant for chronic diastolic CHF, nocturnal hypoxemia, recent diagnosis of UTI and on antibiotics, history of CVA with left-sided weakness, laryngopharyngeal reflux disease, pharyngoesophageal dysphagia, bilateral chronic otitis media with effusion status post prior ear tube placement, physical deconditioning, chronic anxiety/depression, mobility impairment, chronic respiratory failure with hypoxia, who presented to [facility] ED from her PCP due to lethargy and hypersomnolence. 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 Pna vs atx, ct brain negative for acute, Stable encephalomalacia within the right basal ganglia consistent with prior infarct. pt coughing with po Clinical Impression: Clinical Impression: Patient exhibited an oropharyngeal dysphagia with sensed aspiration of thin liquids when consumed via large cup sip or straw sip. Delays in initiation of swallow were observed at level of the pyriform sinus with nectar thick and thin liquids and delays at level of vallecular sinus with puree solids, mechanical soft solids and honey thick liquids. She exhibited sensed aspiration with large cup sip as well as with straw sip of thin  liquids but was ineffective to clear. She exhibited one instance of trace silent aspiration with thin liquids via consecutive cup sips. Oral phase of swallow was moderately impaired with mechanical soft solids, with prolonged mastication and disorganized tongue motion leading to delays in transit of bolus from anterior to posterior portion of oral cavity but with puree solids, bolus transit was only mildly prolonged. Penetration and aspiration events with thin liquids occured secondary to patient with incomplete laryngeal vestibule closure and reduced tongue base retraction. Patient did also exhibit partial obstruction of flow of barium secondary to reduced PES opening but no barium retention observed at this level. Factors that may increase risk of adverse event in presence of aspiration ([place] 2021): Factors that may increase risk of adverse event in presence of aspiration ([place] 2021): Reduced cognitive function; Frail or deconditioned; Weak cough Recommendations/Plan: Swallowing Evaluation Recommendations Swallowing Evaluation Recommendations Recommendations: PO diet PO Diet Recommendation: Mildly thick liquids (Level 2, nectar thick); Dysphagia 1 (Pureed) Liquid Administration via: Cup; Straw Medication Administration: Crushed with puree Supervision: Full assist for feeding; Full supervision/cueing for swallowing strategies Swallowing strategies  : Slow rate; Small bites/sips; Check for pocketing or oral holding Postural changes: Position pt fully upright for meals Oral care recommendations: Oral care BID (2x/day); Oral care before ice chips/water; Staff/trained caregiver to provide oral care Treatment Plan Treatment Plan Treatment recommendations: Therapy as outlined in treatment plan below Follow-up recommendations: Long-term institutional care without follow up therapy Functional status assessment: Patient has had a recent decline in their functional status and demonstrates the ability  to make significant improvements in function in a reasonable and predictable amount of time. Treatment frequency: Min 2x/week Treatment duration: 2 weeks Interventions: Compensatory techniques; Patient/family education; Diet toleration management by SLP; Trials of upgraded texture/liquids Recommendations Recommendations for follow up therapy are one component of a multi-disciplinary discharge planning process, led by the attending physician.  Recommendations may be updated based on patient status, additional functional criteria and insurance authorization. Assessment: Orofacial Exam: Orofacial Exam Oral Cavity: Oral Hygiene: WFL Oral Cavity - Dentition: Adequate natural dentition Orofacial Anatomy: WFL Oral Motor/Sensory Function: WFL Anatomy: Anatomy: WFL Thin Liquids: Thin Liquids (Level 0) Thin Liquids : Impaired Bolus delivery method: Spoon; Cup; Straw Thin  Liquid - Impairment: Pharyngeal impairment Initiation of swallow : Pyriform sinuses Soft palate elevation: Complete Laryngeal elevation: Complete superior movement of thyroid cartilage with complete approximation of arytenoids to epiglottic petiole Anterior hyoid excursion: Complete Epiglottic movement: Complete Laryngeal vestibule closure: Incomplete Pharyngeal stripping wave : Present - complete Pharyngoesophageal segment opening: Partial distention/partial duration, partial obstruction of flow Tongue base retraction: Trace column of contrast or air between tongue base and PPW Pharyngeal residue: Trace residue within or on pharyngeal structures Location of pharyngeal residue: Valleculae Penetration/Aspiration Scale (PAS) score: 8.  Material enters airway, passes BELOW cords without attempt by patient to eject out (silent aspiration); 3.  Material enters airway, remains ABOVE vocal cords and not ejected out  Mildly Thick Liquids: Mildly thick liquids (Level 2, nectar thick) Mildly thick liquids (Level 2, nectar thick): Impaired Bolus delivery method: Cup;  Spoon; Straw Mildly Thick Liquid - Impairment: Pharyngeal impairment Initiation of swallow : Pyriform sinuses Soft palate elevation: Complete Laryngeal elevation: Complete superior movement of thyroid cartilage with complete approximation of arytenoids to epiglottic petiole Anterior hyoid excursion: Complete Epiglottic movement: Complete Laryngeal vestibule closure: Incomplete Pharyngeal stripping wave : Present - complete Pharyngoesophageal segment opening: Partial distention/partial duration, partial obstruction of flow Tongue base retraction: Trace column of contrast or air between tongue base and PPW Pharyngeal residue: Trace residue within or on pharyngeal structures Location of pharyngeal residue: Valleculae Penetration/Aspiration Scale (PAS) score: 1.  Material does not enter airway  Moderately Thick Liquids: Moderately thick liquids (Level 3, honey thick) Moderately thick liquids (Level 3, honey thick): Impaired Bolus delivery method: Spoon Moderately Thick Liquid - Impairment: Pharyngeal impairment Initiation of swallow : Pyriform sinuses Soft palate elevation: Complete Laryngeal elevation: Complete superior movement of thyroid cartilage with complete approximation of arytenoids to epiglottic petiole Anterior hyoid excursion: Complete Epiglottic movement: Complete Laryngeal vestibule closure: Complete: No air/contrast in laryngeal vestibule Pharyngeal stripping wave : Present - complete Pharyngoesophageal segment opening: Partial distention/partial duration, partial obstruction of flow Tongue base retraction: No contrast between tongue base and posterior pharyngeal wall (PPW) Pharyngeal residue: Trace residue within or on pharyngeal structures Location of pharyngeal residue: Valleculae Penetration/Aspiration Scale (PAS) score: 1.  Material does not enter airway  Puree: Puree Puree: Impaired Puree - Impairment: Oral Impairment; Pharyngeal impairment Lip Closure: No labial escape Bolus transport/lingual  motion: Brisk tongue motion Oral residue: Complete oral clearance Location of oral residue : N/A Initiation of swallow: Valleculae Soft palate elevation: Complete Laryngeal elevation: Complete superior movement of thyroid cartilage with complete approximation of arytenoids to epiglottic petiole Anterior hyoid excursion: Complete Epiglottic movement: Complete Laryngeal vestibule closure: Complete: No air/contrast in laryngeal vestibule Pharyngeal stripping wave : Present - complete Pharyngoesophageal segment opening: Partial distention/partial duration, partial obstruction of flow Tongue base retraction: No contrast between tongue base and posterior pharyngeal wall (PPW) Pharyngeal residue: Complete pharyngeal clearance Location of pharyngeal residue: N/A Penetration/Aspiration Scale (PAS) score: 1.  Material does not enter airway Solid: Solid Solid: Impaired Solid - Impairment: Oral Impairment; Pharyngeal impairment Lip Closure: No labial escape Bolus preparation/mastication: Slow prolonged chewing/mashing with complete recollection Bolus transport/lingual motion: Repetitive/disorganized tongue motion Oral residue: Trace residue lining oral structures Location of oral residue : Tongue Initiation of swallow: Valleculae Soft palate elevation: Complete Laryngeal elevation: Complete superior movement of thyroid cartilage with complete approximation of arytenoids to epiglottic petiole Anterior hyoid excursion: Complete Epiglottic movement: Complete Laryngeal vestibule closure: Complete: No air/contrast in laryngeal vestibule Pharyngeal stripping wave : Present - complete Pharyngoesophageal segment opening: Partial distention/partial duration, partial obstruction of flow  Tongue base retraction: No contrast between tongue base and posterior pharyngeal wall (PPW) Pharyngeal residue: Collection of residue within or on pharyngeal structures Location of pharyngeal residue: Valleculae Penetration/Aspiration Scale (PAS) score:  1.  Material does not enter airway Pill: Pill Pill: Not Tested Compensatory Strategies: Compensatory Strategies Compensatory strategies: Yes Straw: Ineffective   General Information: Caregiver present: No  Diet Prior to this Study: NPO   Temperature : Normal   Respiratory Status: WFL   Supplemental O2: Nasal cannula   History of Recent Intubation: No  Behavior/Cognition: Alert; Cooperative; Pleasant mood; Requires cueing Self-Feeding Abilities: Dependent for feeding Baseline vocal quality/speech: Hypophonia/low volume Volitional Cough: Unable to elicit Volitional Swallow: Unable to elicit Exam Limitations: No limitations Goal Planning: Prognosis for improved oropharyngeal function: Fair Barriers to Reach Goals: Overall medical prognosis; Time post onset; Cognitive deficits No data recorded Patient/Family Stated Goal: patient's great nephew who was present would like patient to be able to have PO's and is in agreement with plan for MBS Consulted and agree with results and recommendations: Family member/caregiver; Physician Pain: Pain Assessment Pain Assessment: Faces Pain Score: 0 Faces Pain Scale: 0 Breathing: 0 Negative Vocalization: 0 Facial Expression: 0 Body Language: 1 Consolability: 0 PAINAD Score: 1 End of Session: Start Time:SLP Start Time (ACUTE ONLY): 1230 Stop Time: SLP Stop Time (ACUTE ONLY): 1300 Time Calculation:SLP Time Calculation (min) (ACUTE ONLY): 30 min Charges: SLP Evaluations $ SLP Speech Visit: 1 Visit SLP Evaluations $BSS Swallow: 1 Procedure $MBS Swallow: 1 Procedure $Swallowing Treatment: 1 Procedure SLP visit diagnosis: SLP Visit Diagnosis: Dysphagia, oropharyngeal phase (R13.12) Past Medical History: Past Medical History: Diagnosis Date  Angioedema   possibly from voltaren  Bronchopneumonia 12/11/2016  Degenerative disc disease   Diabetes mellitus   type II  Hyperlipidemia   Hypertension   LVH (left ventricular hypertrophy)   and atrial enlargement by echo in past with nl EF  Nasal  pruritis   Osteoarthritis   Osteopenia   Renal insufficiency   Sleep apnea   Stroke ([place]) 05/2010  Small vessel sobcortical (in [place] trial) with Dr Leonie Man, residual L hemiparesis  Vitamin B 12 deficiency 04/08 Past Surgical History: Past Surgical History: Procedure Laterality Date  ABDOMINAL HYSTERECTOMY    BSO-fibroids  APPENDECTOMY    BACK SURGERY    COLON SURGERY    due to punctured intestines  EYE SURGERY    cataract extraction  IR THORACENTESIS ASP PLEURAL SPACE W/IMG GUIDE  06/07/2020  KNEE SURGERY    arthroscope  PARS PLANA VITRECTOMY  07/31/2011  Procedure: PARS PLANA VITRECTOMY WITH 25 GAUGE;  Surgeon: [name], MD;  Location: [place];  Service: Ophthalmology;  Laterality: Right;  REMOVAL OF SILICONE OIL AND LASER RIGHT EYE  RETINAL DETACHMENT SURGERY  02/18/11  times 2  SPINE SURGERY  08/09  spinal decompression surgery Sonia Baller, MA, CCC-SLP Speech Therapy   EEG adult  Result Date: 10/01/2022 Lora Havens, MD     10/02/2022  8:47 AM Patient Name: Renee [name] MRN: [id number] Epilepsy Attending: Lora Havens Referring Physician/Provider: Vernelle Emerald, MD Date: 10/01/2022 Duration: 21.20 mins Patient history: 89yo F with ams. EEG to evaluate for seizure Level of alertness: Awake AEDs during EEG study: None Technical aspects: This EEG study was done with scalp electrodes positioned according to the 10-20 International system of electrode placement. Electrical activity was reviewed with band pass filter of 1-70Hz$ , sensitivity of 7 uV/mm, display speed of 21m/sec with a 60Hz$  notched filter applied as appropriate. EEG  data were recorded continuously and digitally stored.  Video monitoring was available and reviewed as appropriate. Description: The posterior dominant rhythm consists of 8 Hz activity of moderate voltage (25-35 uV) seen predominantly in posterior head regions, symmetric and reactive to eye opening and eye closing. Hyperventilation and photic stimulation were not  performed.   IMPRESSION: This study is within normal limits. No seizures or epileptiform discharges were seen throughout the recording. Lora Havens   CT CHEST ABDOMEN PELVIS WO CONTRAST  Result Date: 09/30/2022 CLINICAL DATA:  Encephalopathy.  Sepsis.  Suspected pneumonia. EXAM: CT CHEST, ABDOMEN AND PELVIS WITHOUT CONTRAST TECHNIQUE: Multidetector CT imaging of the chest, abdomen and pelvis was performed following the standard protocol without IV contrast. RADIATION DOSE REDUCTION: This exam was performed according to the departmental dose-optimization program which includes automated exposure control, adjustment of the mA and/or kV according to patient size and/or use of iterative reconstruction technique. COMPARISON:  CT, 05/24/2020. Chest radiographs, most recent 09/29/2022. FINDINGS: CT CHEST FINDINGS Cardiovascular: Heart mildly enlarged. Small pericardial effusion. Mild three-vessel coronary artery calcifications. Great vessels are normal in caliber. Aortic atherosclerotic calcifications. Mediastinum/Nodes: No neck base, mediastinal or hilar masses. No enlarged lymph nodes. Trachea and esophagus are unremarkable. Lungs/Pleura: Small patchy areas of predominantly ground-glass opacity noted in the upper lobes, right greater than left. Dependent opacity noted in both lower lobes, right greater than left, which may reflect atelectasis, pneumonia or a combination. Trace bilateral pleural effusions. No pulmonary edema. No pneumothorax. Musculoskeletal: Displaced fracture of the posterior right tenth rib, with a more subtle nondisplaced fracture of the posterior right eleventh rib. These appear recent. No other evidence of an acute/recent fracture. No bone lesion. No chest wall mass. CT ABDOMEN PELVIS FINDINGS Hepatobiliary: Normal liver. No gallstones. No gallbladder wall thickening or inflammation. No bile duct dilation. Pancreas: Pancreatic atrophy.  No mass or inflammation. Spleen: Normal in size  without focal abnormality. Adrenals/Urinary Tract: No adrenal mass. Renal cortical thinning, greater on the left. Kidneys normal in position low-attenuation left renal masses up to 2 cm in size, consistent with cysts. No follow-up recommended. No stones. No hydronephrosis. Normal ureters. Normal bladder. Stomach/Bowel: Normal stomach. Small bowel and colon are normal in caliber. No wall thickening. No inflammation. Multiple sigmoid colon diverticula. No diverticulitis. Vascular/Lymphatic: Aortic atherosclerosis. No aneurysm. No enlarged lymph nodes. Reproductive: Unremarkable. Other: None. Musculoskeletal: Mild loss of the L4 vertebral height due to depression of the upper endplate, unchanged from a CT dated 07/01/2019. No acute fracture. No bone lesion. IMPRESSION: 1. Small patchy areas of predominantly ground-glass opacities noted in the upper lobes, right greater than left, consistent with multifocal pneumonia. More confluent opacity noted in the lower lobes, right greater than left, consistent with atelectasis, infection or a combination. Small effusions. 2. Nondisplaced fractures of the posterior right tenth and eleventh ribs. These appear recent. 3. No other evidence of an acute abnormality within the chest, abdomen or pelvis. Electronically Signed   By: Lajean Manes M.D.   On: 09/30/2022 13:23   MR BRAIN WO CONTRAST  Result Date: 09/30/2022 CLINICAL DATA:  Neuro deficit with acute stroke suspected EXAM: MRI HEAD WITHOUT CONTRAST TECHNIQUE: Multiplanar, multiecho pulse sequences of the brain and surrounding structures were obtained without intravenous contrast. COMPARISON:  Head CT 09/26/2022 FINDINGS: Brain: No acute infarction, hemorrhage, hydrocephalus, extra-axial collection or mass lesion. Generalized brain atrophy. 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 Vascular: Fall major flow voids are preserved. Skull  and upper  cervical spine: Normal marrow signal. Sinuses/Orbits: Partial right mastoid opacification with normal nasopharynx. Bilateral cataract resection. Right scleral banding. IMPRESSION: 1. No acute or reversible finding. 2. Brain atrophy and chronic small vessel ischemia. Electronically Signed   By: Jorje Guild M.D.   On: 09/30/2022 13:09   DG Chest Port 1 View  Result Date: 09/29/2022 CLINICAL DATA:  Acute respiratory distress. EXAM: PORTABLE CHEST 1 VIEW COMPARISON:  09/28/2022 and older exams. FINDINGS: Lung base opacities, accentuated by low lung volumes, are stable, consistent with atelectasis. Remainder of the lungs is clear. No convincing pleural effusion. No pneumothorax. Cardiac silhouette normal in size. IMPRESSION: 1. No change from the previous day's exam. Electronically Signed   By: Lajean Manes M.D.   On: 09/29/2022 19:09   DG Chest 1 View  Result Date: 09/28/2022 CLINICAL DATA:  Aspiration into airway. EXAM: CHEST  1 VIEW COMPARISON:  09/26/2022 FINDINGS: Increased densities at the left lung base and medial right lung base. Overall, there appears to be low lung volumes. Heart size is grossly stable but poorly characterized. Patient is also rotated on this examination. Negative for pneumothorax. IMPRESSION: Low lung volumes with slightly increased densities at both lung bases. Findings could represent atelectasis and/or airspace disease. Recommend continued follow-up. Electronically Signed   By: Markus Daft M.D.   On: 09/28/2022 09:30   CT HEAD WO CONTRAST (5MM)  Result Date: 09/26/2022 CLINICAL DATA:  Slurred speech, left-sided weakness, previous stroke EXAM: CT HEAD WITHOUT CONTRAST TECHNIQUE: Contiguous axial images were obtained from the base of the skull through the vertex without intravenous contrast. RADIATION DOSE REDUCTION: This exam was performed according to the departmental dose-optimization program which includes automated exposure control, adjustment of the mA and/or kV according  to patient size and/or use of iterative reconstruction technique. COMPARISON:  08/29/2021 FINDINGS: Evaluation is limited by patient motion. Brain: Stable encephalomalacia within the right basal ganglia consistent with prior infarct. Chronic small vessel ischemic changes are seen within the periventricular white matter, right greater than left, stable. No evidence of acute infarct or hemorrhage. Lateral ventricles and remaining midline structures are unremarkable. No acute extra-axial fluid collections. No mass effect. Vascular: No hyperdense vessel or unexpected calcification. Skull: Normal. Negative for fracture or focal lesion. Sinuses/Orbits: No acute finding. Other: None. IMPRESSION: 1. Stable chronic ischemic changes as above. No acute intracranial process. Electronically Signed   By: Randa Ngo M.D.   On: 09/26/2022 16:37   DG Chest Portable 1 View  Result Date: 09/26/2022 CLINICAL DATA:  Cough, altered level of consciousness, left-sided weakness EXAM: PORTABLE CHEST 1 VIEW COMPARISON:  09/12/2022 FINDINGS: Single frontal view of the chest demonstrates a stable cardiac silhouette. Lung volumes are diminished, without airspace disease, effusion, or pneumothorax. No acute bony abnormality. IMPRESSION: 1. Low lung volumes.  No acute process. Electronically Signed   By: Randa Ngo M.D.   On: 09/26/2022 16:11   DG Chest 2 View  Result Date: 09/12/2022 CLINICAL DATA:  Pleural effusion follow-up. EXAM: CHEST - 2 VIEW COMPARISON:  Chest x-ray dated August 24, 2022. FINDINGS: Stable cardiomediastinal silhouette. Continued low lung volumes. Unchanged small to moderate left pleural effusion with left basilar atelectasis. The right lung is clear. No pneumothorax. No acute osseous abnormality. IMPRESSION: 1. Unchanged small to moderate left pleural effusion with left basilar atelectasis. Electronically Signed   By: Titus Dubin M.D.   On: 09/12/2022 15:58     The results of significant diagnostics  from this hospitalization (including imaging, microbiology, ancillary  and laboratory) are listed below for reference.     Microbiology: Recent Results (from the past 240 hour(s))  MRSA Next Gen by PCR, Nasal     Status: None   Collection Time: 10/02/22  5:24 PM   Specimen: Nasal Mucosa; Nasal Swab  Result Value Ref Range Status   MRSA by PCR Next Gen NOT DETECTED NOT DETECTED Final    Comment: (NOTE) The GeneXpert MRSA Assay (FDA approved for NASAL specimens only), is one component of a comprehensive MRSA colonization surveillance program. It is not intended to diagnose MRSA infection nor to guide or monitor treatment for MRSA infections. Test performance is not FDA approved in patients less than 64 years old. Performed at [facility], [place] [address] [place], [place] [address]   Culture, blood (Routine X 2) w Reflex to ID Panel     Status: None   Collection Time: 10/02/22  8:47 PM   Specimen: BLOOD  Result Value Ref Range Status   Specimen Description   Final    BLOOD RIGHT ANTECUBITAL Performed at [facility] [address] [place], [place] [address]    Special Requests   Final    BOTTLES DRAWN AEROBIC ONLY Blood Culture adequate volume Performed at [facility] [address], [place], [place] [address]    Culture   Final    NO GROWTH 5 DAYS Performed at [facility], [place] [address], [place], [place] [address]    Report Status 10/07/2022 FINAL  Final  Culture, blood (Routine X 2) w Reflex to ID Panel     Status: None   Collection Time: 10/02/22  8:47 PM   Specimen: BLOOD  Result Value Ref Range Status   Specimen Description   Final    BLOOD BLOOD LEFT HAND Performed at [facility] [address], [place], [place] [address]    Special Requests   Final    BOTTLES DRAWN AEROBIC ONLY Blood Culture results may not be optimal due to an inadequate volume of blood received in culture  bottles Performed at [facility] [address], [place], [place] [address]    Culture   Final    NO GROWTH 5 DAYS Performed at [facility], [place] [address], [place],  [address]    Report Status 10/07/2022 FINAL  Final     Labs: BNP (last 3 results) Recent Labs    09/26/22 2120 09/29/22 0540 10/02/22 0503  BNP 296.7* 174.1* [id number]*   Basic Metabolic Panel: Recent Labs  Lab 10/04/22 0256 10/05/22 0321 10/06/22 0259 10/07/22 0257 10/08/22 0348  NA 137 138 132* 135 135  K 3.6 3.5 3.5 3.9 4.0  CL 100 101 101 100 100  CO2 29 27 28 27 27  $ GLUCOSE 232* 256* 275* 251* 196*  BUN 15 13 13 10 11  $ CREATININE 1.05* 1.05* 0.96 1.02* 1.00  CALCIUM 8.0* 8.3* 7.7* 8.2* 8.5*  MG 1.7 1.6* 2.1 2.0 1.9   Liver Function Tests: Recent Labs  Lab 10/02/22 0503 10/03/22 0252  AST 31 38  ALT 19 25  ALKPHOS 160* 186*  BILITOT 0.4 0.5  PROT 4.8* 5.8*  ALBUMIN 2.4* 3.0*   No results for input(s): "LIPASE", "AMYLASE" in the last 168 hours. No results for input(s): "AMMONIA" in the last 168 hours. CBC: Recent Labs  Lab 10/02/22 0503 10/03/22 0252 10/04/22 0256 10/05/22 0321 10/06/22 0259 10/07/22 0257 10/08/22 0348  WBC 5.9 6.4 5.1 5.5  3.8* 3.9* 3.9*  NEUTROABS 3.8 4.8  --   --   --   --   --   HGB 8.5* 9.5* 8.3* 9.1* 7.7* 9.3* 9.0*  HCT 29.0* 32.4* 28.1* 30.6* 25.8* 32.2* 30.0*  MCV 92.4 92.8 92.1 90.5 91.5 92.5 91.7  PLT 208 275 231 251 186 199 177   Cardiac Enzymes: No results for input(s): "CKTOTAL", "CKMB", "CKMBINDEX", "TROPONINI" in the last 168 hours. BNP: Invalid input(s): "POCBNP" CBG: Recent Labs  Lab 10/07/22 2029 10/08/22 0100 10/08/22 0412 10/08/22 0727 10/08/22 1156  GLUCAP 230* 205* 182* 182* 236*   D-Dimer No results for input(s): "DDIMER" in the last 72 hours. Hgb A1c No results for input(s): "HGBA1C" in the last 72 hours. Lipid Profile No results for input(s): "CHOL", "HDL", "LDLCALC", "TRIG", "CHOLHDL",  "LDLDIRECT" in the last 72 hours. Thyroid function studies No results for input(s): "TSH", "T4TOTAL", "T3FREE", "THYROIDAB" in the last 72 hours.  Invalid input(s): "FREET3" Anemia work up No results for input(s): "VITAMINB12", "FOLATE", "FERRITIN", "TIBC", "IRON", "RETICCTPCT" in the last 72 hours. Urinalysis    Component Value Date/Time   COLORURINE YELLOW 09/26/2022 1600   APPEARANCEUR CLEAR 09/26/2022 1600   APPEARANCEUR Cloudy (A) 04/06/2022 1200   LABSPEC 1.023 09/26/2022 1600   PHURINE 5.0 09/26/2022 1600   GLUCOSEU >=500 (A) 09/26/2022 1600   GLUCOSEU NEGATIVE 07/04/2022 1140   HGBUR NEGATIVE 09/26/2022 1600   HGBUR negative 03/27/2010 1358   BILIRUBINUR NEGATIVE 09/26/2022 1600   BILIRUBINUR negative 09/22/2022 1354   BILIRUBINUR negative 08/24/2022 1408   BILIRUBINUR Negative 04/06/2022 1200   KETONESUR NEGATIVE 09/26/2022 1600   PROTEINUR 100 (A) 09/26/2022 1600   UROBILINOGEN 1.0 09/22/2022 1354   UROBILINOGEN 0.2 07/04/2022 1140   NITRITE NEGATIVE 09/26/2022 1600   LEUKOCYTESUR NEGATIVE 09/26/2022 1600   Sepsis Labs Recent Labs  Lab 10/05/22 0321 10/06/22 0259 10/07/22 0257 10/08/22 0348  WBC 5.5 3.8* 3.9* 3.9*   Microbiology Recent Results (from the past 240 hour(s))  MRSA Next Gen by PCR, Nasal     Status: None   Collection Time: 10/02/22  5:24 PM   Specimen: Nasal Mucosa; Nasal Swab  Result Value Ref Range Status   MRSA by PCR Next Gen NOT DETECTED NOT DETECTED Final    Comment: (NOTE) The GeneXpert MRSA Assay (FDA approved for NASAL specimens only), is one component of a comprehensive MRSA colonization surveillance program. It is not intended to diagnose MRSA infection nor to guide or monitor treatment for MRSA infections. Test performance is not FDA approved in patients less than 22 years old. Performed at [facility], [place] [address] [place], [place] [address]   Culture, blood (Routine X 2) w Reflex to ID Panel     Status:  None   Collection Time: 10/02/22  8:47 PM   Specimen: BLOOD  Result Value Ref Range Status   Specimen Description   Final    BLOOD RIGHT ANTECUBITAL Performed at [facility] [address] [place], [place] [address]    Special Requests   Final    BOTTLES DRAWN AEROBIC ONLY Blood Culture adequate volume Performed at [facility] [address] [place], [place] [address]    Culture   Final    NO GROWTH 5 DAYS Performed at [facility], [place] [address] [place],  [address]    Report Status 10/07/2022 FINAL  Final  Culture, blood (Routine X 2) w Reflex to ID Panel     Status: None  Collection Time: 10/02/22  8:47 PM   Specimen: BLOOD  Result Value Ref Range Status   Specimen Description   Final    BLOOD BLOOD LEFT HAND Performed at [facility] [address] [place], [place] [address]    Special Requests   Final    BOTTLES DRAWN AEROBIC ONLY Blood Culture results may not be optimal due to an inadequate volume of blood received in culture bottles Performed at [facility] [address] [place], [place] [address]    Culture   Final    NO GROWTH 5 DAYS Performed at [facility], [place] [address] [place], [place] [address]    Report Status 10/07/2022 FINAL  Final     Time coordinating discharge:  I have spent 35 minutes face to face with the patient and on the ward discussing the patients care, assessment, plan and disposition with other care givers. >50% of the time was devoted counseling the patient about the risks and benefits of treatment/Discharge disposition and coordinating care.   SIGNED:   Damita Lack, MD  [facility] 10/08/2022, 3:51 PM   If 7PM-7AM, please contact night-coverage

## 2022-10-08 NOTE — Care Management Important Message (Signed)
Important Message  Patient Details No IM Letter given due to Hospice Name: RAYETTA TALLUTO MRN: [id number] Date of Birth: [birth year]   Medicare Important Message Given:  No     Kerin Salen 10/08/2022, 10:39 AM

## 2022-10-08 NOTE — Progress Notes (Signed)
Manufacturing engineer [facility]) Hospital Liaison Note  Received request from [facility] [place] via [place] for interest in [facility]. Visited patient at bedside and spoke with [facility] representative/Heather to confirm interest and explain services.  Patient has been approved for transfer to [facility].   Consent forms completed.   PTAR notified of patient D/C and transport arranged by MSW for 8 pm pick up. TOC/[name] and Attending Physician/Dr. Reesa Chew also notified of transport arrangement.    Please send signed DNR form with patient and RN call report to [phone].    Phillis Haggis, MSW [facility] Liaison [phone]
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
## 2022-10-12 ENCOUNTER — Ambulatory Visit: Payer: Medicare HMO | Admitting: Pulmonary Disease

## 2022-10-18 DIAGNOSIS — J9601 Acute respiratory failure with hypoxia: Secondary | ICD-10-CM | POA: Diagnosis not present

## 2022-10-19 DEATH — deceased

## 2022-11-17 ENCOUNTER — Other Ambulatory Visit: Payer: Self-pay | Admitting: Family Medicine

## 2023-02-15 ENCOUNTER — Encounter (INDEPENDENT_AMBULATORY_CARE_PROVIDER_SITE_OTHER): Payer: Medicare HMO | Admitting: Ophthalmology

## 2023-05-15 IMAGING — DX DG CHEST 1V PORT
1 series · 1 of 1 positions shown · non-contrast
Comparison: 01/08/2022

CLINICAL DATA: Congestion, shortness of breath

EXAM:
PORTABLE CHEST 1 VIEW

[chest ap]
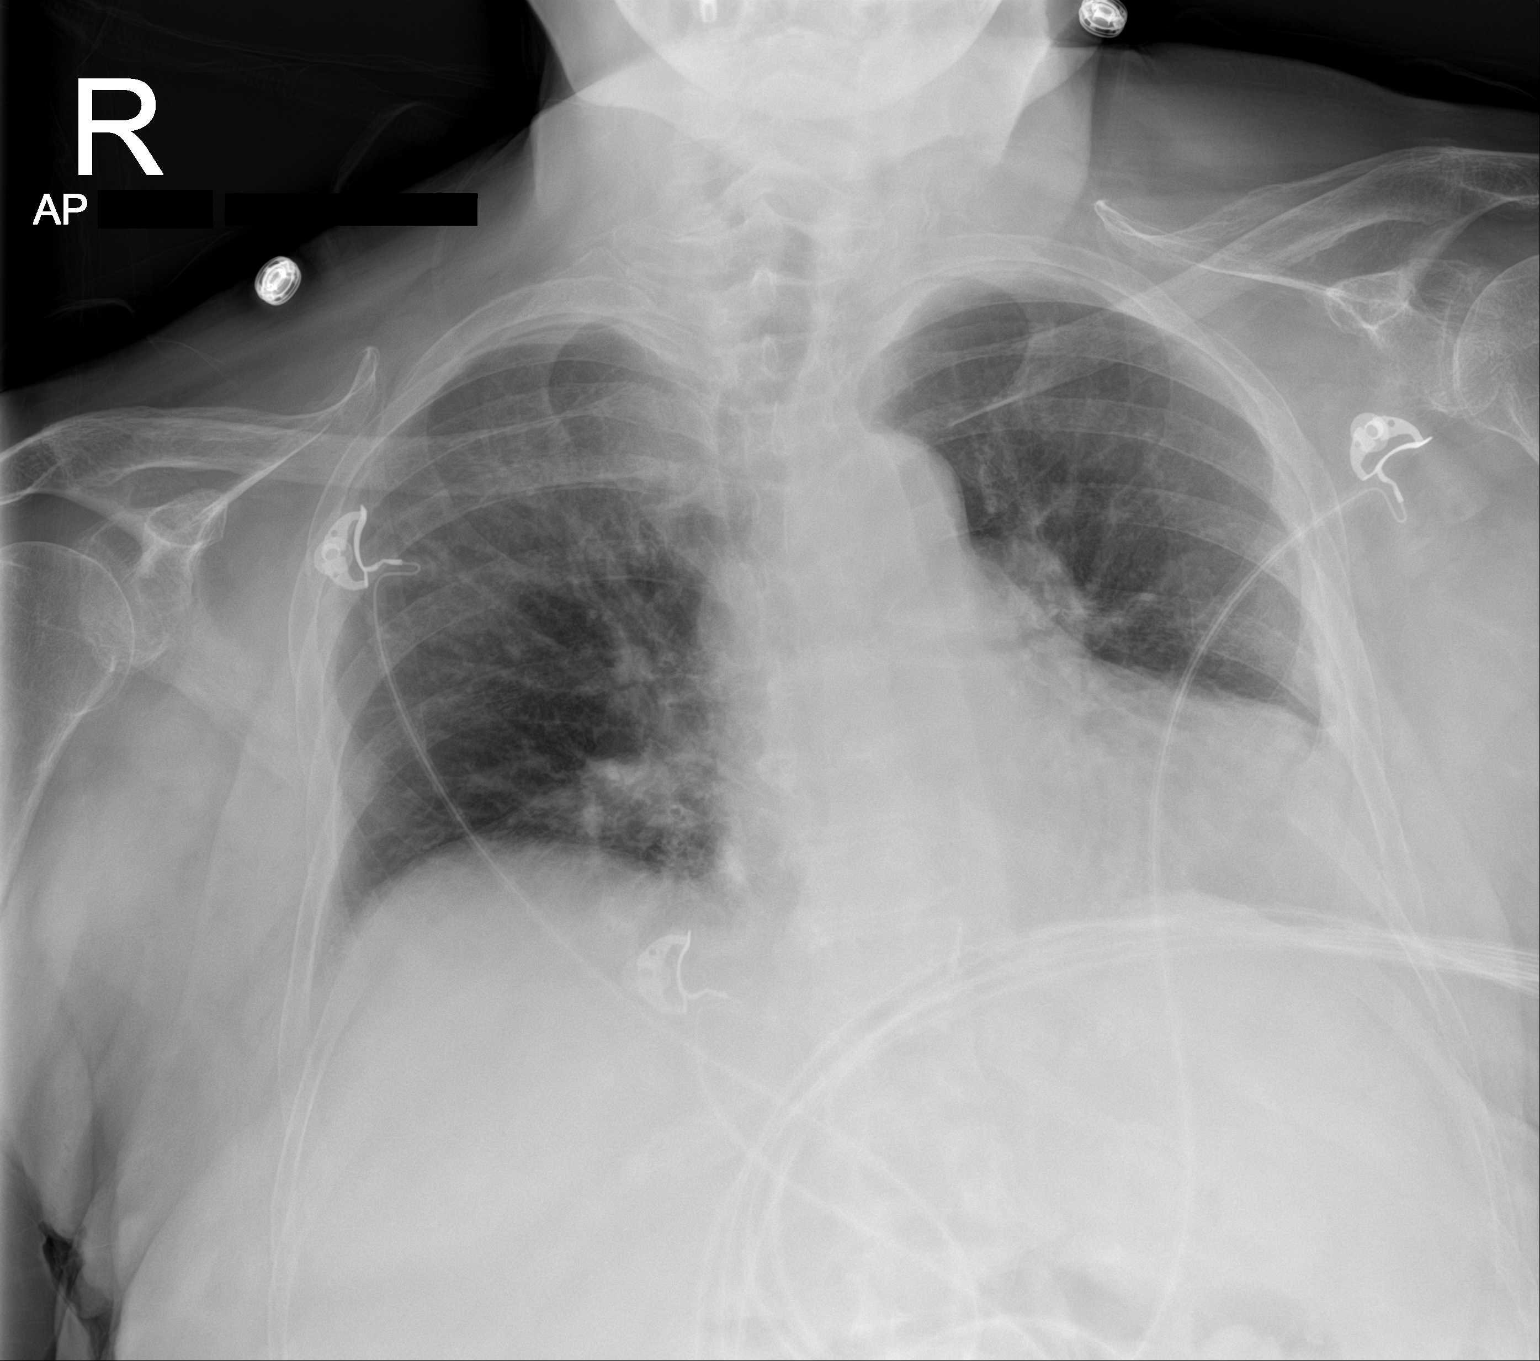

[1 of 1 positions shown; findings below may reference images not displayed]

FINDINGS: Lungs are clear.  No pleural effusion or pneumothorax.

The heart is normal in size.
IMPRESSION: No evidence of acute cardiopulmonary disease.

## 2024-05-18 NOTE — Telephone Encounter (Signed)
 Pt. Caregiver called in and stated pt is having confusion, sugar high and sweats from antibiotic. Spoke w/Dr. Ethyl and he stated for pt to stop antibiotic and if she worsen to tak her to ED for evaluation, most likely not the antibiotic.
